# Patient Record
Sex: Female | Born: 1960
Health system: Southern US, Community
[De-identification: ages and names within clinical notes are randomized; demographics above are authoritative.]

## PROBLEM LIST (undated history)

## (undated) DIAGNOSIS — F419 Anxiety disorder, unspecified: Secondary | ICD-10-CM

## (undated) DIAGNOSIS — C50919 Malignant neoplasm of unspecified site of unspecified female breast: Secondary | ICD-10-CM

## (undated) DIAGNOSIS — F319 Bipolar disorder, unspecified: Secondary | ICD-10-CM

## (undated) DIAGNOSIS — Z9889 Other specified postprocedural states: Secondary | ICD-10-CM

## (undated) DIAGNOSIS — D649 Anemia, unspecified: Secondary | ICD-10-CM

## (undated) DIAGNOSIS — J40 Bronchitis, not specified as acute or chronic: Secondary | ICD-10-CM

## (undated) DIAGNOSIS — C801 Malignant (primary) neoplasm, unspecified: Secondary | ICD-10-CM

## (undated) DIAGNOSIS — Z95828 Presence of other vascular implants and grafts: Secondary | ICD-10-CM

## (undated) DIAGNOSIS — F329 Major depressive disorder, single episode, unspecified: Secondary | ICD-10-CM

## (undated) DIAGNOSIS — M1990 Unspecified osteoarthritis, unspecified site: Secondary | ICD-10-CM

## (undated) DIAGNOSIS — R112 Nausea with vomiting, unspecified: Secondary | ICD-10-CM

## (undated) DIAGNOSIS — I890 Lymphedema, not elsewhere classified: Secondary | ICD-10-CM

## (undated) DIAGNOSIS — F4310 Post-traumatic stress disorder, unspecified: Secondary | ICD-10-CM

## (undated) DIAGNOSIS — F1010 Alcohol abuse, uncomplicated: Secondary | ICD-10-CM

## (undated) DIAGNOSIS — M797 Fibromyalgia: Secondary | ICD-10-CM

## (undated) DIAGNOSIS — I10 Essential (primary) hypertension: Secondary | ICD-10-CM

## (undated) DIAGNOSIS — T4145XA Adverse effect of unspecified anesthetic, initial encounter: Secondary | ICD-10-CM

## (undated) DIAGNOSIS — R519 Headache, unspecified: Secondary | ICD-10-CM

## (undated) DIAGNOSIS — R51 Headache: Secondary | ICD-10-CM

## (undated) DIAGNOSIS — F1120 Opioid dependence, uncomplicated: Secondary | ICD-10-CM

## (undated) DIAGNOSIS — G8929 Other chronic pain: Secondary | ICD-10-CM

## (undated) DIAGNOSIS — I509 Heart failure, unspecified: Secondary | ICD-10-CM

## (undated) DIAGNOSIS — Z8489 Family history of other specified conditions: Secondary | ICD-10-CM

## (undated) DIAGNOSIS — K219 Gastro-esophageal reflux disease without esophagitis: Secondary | ICD-10-CM

## (undated) DIAGNOSIS — T8859XA Other complications of anesthesia, initial encounter: Secondary | ICD-10-CM

## (undated) DIAGNOSIS — F32A Depression, unspecified: Secondary | ICD-10-CM

## (undated) DIAGNOSIS — E785 Hyperlipidemia, unspecified: Secondary | ICD-10-CM

## (undated) HISTORY — PX: FIBULA FRACTURE SURGERY: SHX947

## (undated) HISTORY — PX: BREAST RECONSTRUCTION: SHX9

## (undated) HISTORY — PX: OTHER SURGICAL HISTORY: SHX169

## (undated) HISTORY — PX: MASTECTOMY: SHX3

## (undated) HISTORY — PX: COLONOSCOPY W/ POLYPECTOMY: SHX1380

---

## 2010-09-05 ENCOUNTER — Emergency Department (HOSPITAL_COMMUNITY): Admission: EM | Admit: 2010-09-05 | Discharge: 2010-09-05 | Payer: Self-pay | Admitting: Emergency Medicine

## 2010-10-22 ENCOUNTER — Emergency Department (HOSPITAL_COMMUNITY): Admission: EM | Admit: 2010-10-22 | Discharge: 2010-10-22 | Payer: Self-pay | Admitting: Emergency Medicine

## 2011-02-10 LAB — CBC
HCT: 40.2 % (ref 36.0–46.0)
Hemoglobin: 14 g/dL (ref 12.0–15.0)
MCH: 32.6 pg (ref 26.0–34.0)
MCHC: 34.8 g/dL (ref 30.0–36.0)
MCV: 93.8 fL (ref 78.0–100.0)
Platelets: 172 10*3/uL (ref 150–400)
RBC: 4.28 MIL/uL (ref 3.87–5.11)
RDW: 13.1 % (ref 11.5–15.5)
WBC: 6.6 10*3/uL (ref 4.0–10.5)

## 2011-02-10 LAB — DIFFERENTIAL
Basophils Absolute: 0 10*3/uL (ref 0.0–0.1)
Basophils Relative: 1 % (ref 0–1)
Eosinophils Absolute: 0.1 10*3/uL (ref 0.0–0.7)
Eosinophils Relative: 1 % (ref 0–5)
Lymphocytes Relative: 29 % (ref 12–46)
Lymphs Abs: 1.9 10*3/uL (ref 0.7–4.0)
Monocytes Absolute: 0.4 10*3/uL (ref 0.1–1.0)
Monocytes Relative: 7 % (ref 3–12)
Neutro Abs: 4.1 10*3/uL (ref 1.7–7.7)
Neutrophils Relative %: 63 % (ref 43–77)

## 2011-02-10 LAB — POCT I-STAT, CHEM 8
BUN: 10 mg/dL (ref 6–23)
Calcium, Ion: 1.08 mmol/L — ABNORMAL LOW (ref 1.12–1.32)
Chloride: 104 mEq/L (ref 96–112)
Creatinine, Ser: 1.1 mg/dL (ref 0.4–1.2)
Glucose, Bld: 83 mg/dL (ref 70–99)
HCT: 44 % (ref 36.0–46.0)
Hemoglobin: 15 g/dL (ref 12.0–15.0)
Potassium: 3.7 mEq/L (ref 3.5–5.1)
Sodium: 139 mEq/L (ref 135–145)
TCO2: 24 mmol/L (ref 0–100)

## 2011-05-17 ENCOUNTER — Ambulatory Visit (HOSPITAL_COMMUNITY)
Admission: RE | Admit: 2011-05-17 | Discharge: 2011-05-17 | Disposition: A | Discharge status: Home / Self Care | Payer: Medicaid Other | Source: Ambulatory Visit | Attending: Psychiatry | Admitting: Psychiatry

## 2011-05-17 ENCOUNTER — Emergency Department (HOSPITAL_COMMUNITY)
Admission: EM | Admit: 2011-05-17 | Discharge: 2011-05-18 | Disposition: A | Discharge status: Other Acute Inpatient Hospital | Payer: Medicaid Other | Attending: Emergency Medicine | Admitting: Emergency Medicine

## 2011-05-17 DIAGNOSIS — F3289 Other specified depressive episodes: Secondary | ICD-10-CM | POA: Insufficient documentation

## 2011-05-17 DIAGNOSIS — F329 Major depressive disorder, single episode, unspecified: Secondary | ICD-10-CM | POA: Insufficient documentation

## 2011-05-17 DIAGNOSIS — Z79899 Other long term (current) drug therapy: Secondary | ICD-10-CM | POA: Insufficient documentation

## 2011-05-17 DIAGNOSIS — F4310 Post-traumatic stress disorder, unspecified: Secondary | ICD-10-CM | POA: Insufficient documentation

## 2011-05-17 LAB — COMPREHENSIVE METABOLIC PANEL
ALT: 16 U/L (ref 0–35)
AST: 25 U/L (ref 0–37)
Albumin: 4.8 g/dL (ref 3.5–5.2)
Alkaline Phosphatase: 52 U/L (ref 39–117)
BUN: 18 mg/dL (ref 6–23)
CO2: 29 mEq/L (ref 19–32)
Calcium: 9.3 mg/dL (ref 8.4–10.5)
Chloride: 101 mEq/L (ref 96–112)
Creatinine, Ser: 0.64 mg/dL (ref 0.50–1.10)
GFR calc Af Amer: >60 mL/min (ref >60)
GFR calc non Af Amer: >60 mL/min (ref >60)
Glucose, Bld: 92 mg/dL (ref 70–99)
Potassium: 3.3 mEq/L — ABNORMAL LOW (ref 3.5–5.1)
Sodium: 139 mEq/L (ref 135–145)
Total Bilirubin: 0.2 mg/dL — ABNORMAL LOW (ref 0.3–1.2)
Total Protein: 7.3 g/dL (ref 6.0–8.3)

## 2011-05-17 LAB — CBC
HCT: 38.7 % (ref 36.0–46.0)
Hemoglobin: 13.1 g/dL (ref 12.0–15.0)
MCH: 30.8 pg (ref 26.0–34.0)
MCHC: 33.9 g/dL (ref 30.0–36.0)
MCV: 91.1 fL (ref 78.0–100.0)
Platelets: 205 10*3/uL (ref 150–400)
RBC: 4.25 MIL/uL (ref 3.87–5.11)
RDW: 14.2 % (ref 11.5–15.5)
WBC: 9.6 10*3/uL (ref 4.0–10.5)

## 2011-05-17 LAB — URINALYSIS, ROUTINE W REFLEX MICROSCOPIC
Bilirubin Urine: NEGATIVE
Glucose, UA: NEGATIVE mg/dL
Ketones, ur: NEGATIVE mg/dL
Leukocytes, UA: NEGATIVE
Nitrite: NEGATIVE
Protein, ur: NEGATIVE mg/dL
Specific Gravity, Urine: 1.04 — ABNORMAL HIGH (ref 1.005–1.030)
Urobilinogen, UA: 0.2 mg/dL (ref 0.0–1.0)
pH: 5 (ref 5.0–8.0)

## 2011-05-17 LAB — DIFFERENTIAL
Basophils Absolute: 0 10*3/uL (ref 0.0–0.1)
Basophils Relative: 0 % (ref 0–1)
Eosinophils Absolute: 0 10*3/uL (ref 0.0–0.7)
Eosinophils Relative: 0 % (ref 0–5)
Lymphocytes Relative: 18 % (ref 12–46)
Lymphs Abs: 1.7 10*3/uL (ref 0.7–4.0)
Monocytes Absolute: 0.6 10*3/uL (ref 0.1–1.0)
Monocytes Relative: 7 % (ref 3–12)
Neutro Abs: 7.2 10*3/uL (ref 1.7–7.7)
Neutrophils Relative %: 75 % (ref 43–77)

## 2011-05-17 LAB — RAPID URINE DRUG SCREEN, HOSP PERFORMED
Amphetamines: NOT DETECTED
Barbiturates: NOT DETECTED
Benzodiazepines: POSITIVE — AB
Cocaine: NOT DETECTED
Opiates: POSITIVE — AB
Tetrahydrocannabinol: NOT DETECTED

## 2011-05-17 LAB — ETHANOL: Alcohol, Ethyl (B): <11 mg/dL (ref 0–11)

## 2011-05-17 LAB — URINE MICROSCOPIC-ADD ON

## 2011-05-17 LAB — PREGNANCY, URINE: Preg Test, Ur: NEGATIVE

## 2011-05-18 ENCOUNTER — Inpatient Hospital Stay (HOSPITAL_COMMUNITY)
Admission: AD | Admit: 2011-05-18 | Discharge: 2011-05-24 | DRG: 897 | Disposition: A | Discharge status: Home / Self Care | Payer: PRIVATE HEALTH INSURANCE | Source: Ambulatory Visit | Attending: Psychiatry | Admitting: Psychiatry

## 2011-05-18 DIAGNOSIS — R45851 Suicidal ideations: Secondary | ICD-10-CM

## 2011-05-18 DIAGNOSIS — F332 Major depressive disorder, recurrent severe without psychotic features: Secondary | ICD-10-CM

## 2011-05-18 DIAGNOSIS — F39 Unspecified mood [affective] disorder: Secondary | ICD-10-CM

## 2011-05-18 DIAGNOSIS — Z5900 Homelessness unspecified: Secondary | ICD-10-CM

## 2011-05-18 DIAGNOSIS — IMO0002 Reserved for concepts with insufficient information to code with codable children: Secondary | ICD-10-CM

## 2011-05-18 DIAGNOSIS — Z560 Unemployment, unspecified: Secondary | ICD-10-CM

## 2011-05-18 DIAGNOSIS — F4310 Post-traumatic stress disorder, unspecified: Secondary | ICD-10-CM

## 2011-05-18 DIAGNOSIS — IMO0001 Reserved for inherently not codable concepts without codable children: Secondary | ICD-10-CM

## 2011-05-18 DIAGNOSIS — Z818 Family history of other mental and behavioral disorders: Secondary | ICD-10-CM

## 2011-05-18 DIAGNOSIS — F1010 Alcohol abuse, uncomplicated: Secondary | ICD-10-CM

## 2011-05-18 DIAGNOSIS — F1110 Opioid abuse, uncomplicated: Principal | ICD-10-CM

## 2011-05-19 DIAGNOSIS — F332 Major depressive disorder, recurrent severe without psychotic features: Secondary | ICD-10-CM

## 2011-05-28 ENCOUNTER — Emergency Department (HOSPITAL_BASED_OUTPATIENT_CLINIC_OR_DEPARTMENT_OTHER)
Admission: EM | Admit: 2011-05-28 | Discharge: 2011-05-28 | Disposition: A | Discharge status: Home / Self Care | Payer: PRIVATE HEALTH INSURANCE | Attending: Emergency Medicine | Admitting: Emergency Medicine

## 2011-05-28 DIAGNOSIS — F341 Dysthymic disorder: Secondary | ICD-10-CM | POA: Insufficient documentation

## 2011-05-28 DIAGNOSIS — Z79899 Other long term (current) drug therapy: Secondary | ICD-10-CM | POA: Insufficient documentation

## 2011-05-28 DIAGNOSIS — M5430 Sciatica, unspecified side: Secondary | ICD-10-CM | POA: Insufficient documentation
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
## 2011-05-30 ENCOUNTER — Emergency Department (HOSPITAL_BASED_OUTPATIENT_CLINIC_OR_DEPARTMENT_OTHER)
Admission: EM | Admit: 2011-05-30 | Discharge: 2011-05-30 | Disposition: A | Discharge status: Home / Self Care | Payer: PRIVATE HEALTH INSURANCE | Attending: Emergency Medicine | Admitting: Emergency Medicine

## 2011-05-30 DIAGNOSIS — M549 Dorsalgia, unspecified: Secondary | ICD-10-CM | POA: Insufficient documentation

## 2011-05-30 DIAGNOSIS — M5430 Sciatica, unspecified side: Secondary | ICD-10-CM | POA: Insufficient documentation

## 2011-05-30 DIAGNOSIS — Z79899 Other long term (current) drug therapy: Secondary | ICD-10-CM | POA: Insufficient documentation

## 2011-05-30 DIAGNOSIS — F17200 Nicotine dependence, unspecified, uncomplicated: Secondary | ICD-10-CM | POA: Insufficient documentation
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
## 2011-06-26 ENCOUNTER — Encounter: Payer: Self-pay | Admitting: *Deleted

## 2011-06-26 ENCOUNTER — Emergency Department (HOSPITAL_BASED_OUTPATIENT_CLINIC_OR_DEPARTMENT_OTHER)
Admission: EM | Admit: 2011-06-26 | Discharge: 2011-06-26 | Disposition: A | Discharge status: Home / Self Care | Payer: Self-pay | Attending: Emergency Medicine | Admitting: Emergency Medicine

## 2011-06-26 DIAGNOSIS — F17200 Nicotine dependence, unspecified, uncomplicated: Secondary | ICD-10-CM | POA: Insufficient documentation

## 2011-06-26 DIAGNOSIS — IMO0001 Reserved for inherently not codable concepts without codable children: Secondary | ICD-10-CM | POA: Insufficient documentation

## 2011-06-26 DIAGNOSIS — G8929 Other chronic pain: Secondary | ICD-10-CM | POA: Insufficient documentation

## 2011-06-26 DIAGNOSIS — F341 Dysthymic disorder: Secondary | ICD-10-CM | POA: Insufficient documentation

## 2011-06-26 DIAGNOSIS — M549 Dorsalgia, unspecified: Secondary | ICD-10-CM | POA: Insufficient documentation

## 2011-06-26 HISTORY — DX: Anxiety disorder, unspecified: F41.9

## 2011-06-26 HISTORY — DX: Fibromyalgia: M79.7

## 2011-06-26 HISTORY — DX: Depression, unspecified: F32.A

## 2011-06-26 HISTORY — DX: Post-traumatic stress disorder, unspecified: F43.10

## 2011-06-26 HISTORY — DX: Major depressive disorder, single episode, unspecified: F32.9

## 2011-06-26 MED ORDER — HYDROCODONE-ACETAMINOPHEN 5-325 MG PO TABS: 1.0000 | ORAL_TABLET | Freq: Four times a day (QID) | ORAL | Status: AC | PRN

## 2011-06-26 MED ORDER — DEXAMETHASONE SODIUM PHOSPHATE 10 MG/ML IJ SOLN
10.0000 mg | Freq: Once | INTRAMUSCULAR | Status: AC
  Administered 2011-06-26: 10 mg via INTRAVENOUS
  Filled 2011-06-26: qty 1

## 2011-06-26 MED ORDER — HYDROCODONE-ACETAMINOPHEN 5-325 MG PO TABS
1.0000 | ORAL_TABLET | Freq: Once | ORAL | Status: AC
  Administered 2011-06-26: 1 via ORAL
  Filled 2011-06-26: qty 1

## 2011-06-26 MED ORDER — CYCLOBENZAPRINE HCL 10 MG PO TABS: 10.0000 mg | ORAL_TABLET | Freq: Two times a day (BID) | ORAL | Status: AC | PRN

## 2011-06-26 MED ORDER — DICLOFENAC SODIUM 75 MG PO TBEC: 75.0000 mg | DELAYED_RELEASE_TABLET | Freq: Two times a day (BID) | ORAL | Status: DC

## 2011-06-26 MED ORDER — KETOROLAC TROMETHAMINE 60 MG/2ML IM SOLN
60.0000 mg | Freq: Once | INTRAMUSCULAR | Status: AC
  Administered 2011-06-26: 60 mg via INTRAMUSCULAR
  Filled 2011-06-26: qty 2

## 2011-06-26 NOTE — Discharge Instructions (Signed)
Back Exercises Back exercises help treat and prevent back injuries. The goal of back exercises is to increase the strength of your abdominal and back muscles and the flexibility of your back. These exercises should be started when you no longer have back pain. Back exercises include: 1. Pelvic Tilt - Lie on your back with your knees bent. Tilt your pelvis until the lower part of your back is against the floor. Hold this position 5-10 sec and repeat 5-10 times.  2. Knee to Chest - Pull first one knee up against your chest and hold for 20-30 seconds, repeat this with the other knee, and then both knees. This may be done with the other leg straight or bent, whichever feels better.  3. Sit-Ups or Curl-Ups - Bend your knees 90 degrees. Start with tilting your pelvis, and do a partial, slow sit-up, lifting your trunk only 30-45 degrees off the floor. Take at least 2-3 sec for each sit-up. Do not do sit-ups with your knees out straight. If partial sit-ups are difficult, simply do the above but with only tightening your abdominal muscles and holding it as directed.  4. Hip-Lift - Lie on your back with your knees flexed 90 degrees. Push down with your feet and shoulders as you raise your hips a couple inches off the floor; hold for 10 sec, repeat 5-10 times.  5. Back arches - Lie on your stomach, propping yourself up on bent elbows. Slowly press on your hands, causing an arch in your low back. Repeat 3-5 times. Any initial stiffness and discomfort should lessen with repetition over time.  6. Shoulder-Lifts - Lie face down with arms beside your body. Keep hips and torso pressed to floor as you slowly lift your head and shoulders off the floor.  Do not overdo your exercises, especially in the beginning. Exercises may cause you some mild back discomfort which lasts for a few minutes; however, if the pain is more severe, or lasts for more than 15 minutes, do not continue exercises until you see your caregiver.  Improvement with exercise therapy for back problems is slow.  See your caregivers for assistance with developing a proper back exercise program. Document Released: 12/24/2004 Document Re-Released: 02/12/2009 Exit[employer] Patient Information 2011 [employer]. 

## 2011-06-26 NOTE — ED Notes (Signed)
Pt c/o lower back pain x 2 months

## 2011-06-26 NOTE — ED Notes (Signed)
Call bell rung, pt needs addressed, pt reports being very upset for having to wait so long. Apologized for wait. Pt talking on phone and informed that MDs will be in to see her as soon as they can.

## 2011-06-26 NOTE — ED Notes (Signed)
Patient is resting comfortably. Warm blankets given

## 2011-06-29 NOTE — ED Provider Notes (Signed)
History     Chief Complaint  Patient presents with  . Back Pain   Patient is a 50 y.o. female presenting with back pain. The history is provided by the patient.  Back Pain  This is a chronic problem. Episode onset: Several months ago. The problem occurs constantly. The problem has been gradually worsening. The pain is associated with no known injury. The pain is present in the lumbar spine. The quality of the pain is described as stabbing. The pain radiates to the right thigh. The pain is severe. The symptoms are aggravated by certain positions and bending. The pain is the same all the time. Pertinent negatives include no chest pain, no fever, no numbness, no headaches, no abdominal pain, no abdominal swelling, no bowel incontinence, no perianal numbness, no bladder incontinence, no dysuria, no pelvic pain, no leg pain, no paresthesias, no tingling and no weakness. She has tried nothing for the symptoms.    Past Medical History  Diagnosis Date  . Fibromyalgia   . Depression   . Anxiety   . PTSD (post-traumatic stress disorder)     History reviewed. No pertinent past surgical history.  History reviewed. No pertinent family history.  History  Substance Use Topics  . Smoking status: Current Everyday Smoker -- 1.0 packs/day  . Smokeless tobacco: Not on file  . Alcohol Use: No    OB History    Grav Para Term Preterm Abortions TAB SAB Ect Mult Living                  Review of Systems  Constitutional: Negative for fever and chills.  HENT: Negative for neck pain and neck stiffness.   Cardiovascular: Negative for chest pain.  Gastrointestinal: Negative for abdominal pain and bowel incontinence.  Genitourinary: Negative for bladder incontinence, dysuria, urgency, frequency, hematuria, flank pain and pelvic pain.  Musculoskeletal: Positive for back pain.  Neurological: Negative for tingling, weakness, numbness, headaches and paresthesias.    Physical Exam  BP 117/64  Pulse 93   Temp(Src) 99.3 F (37.4 C) (Oral)  Resp 16  Wt 130 lb (58.968 kg)  Physical Exam  Constitutional: She is oriented to person, place, and time. She appears well-developed and well-nourished. No distress.  HENT:  Head: Normocephalic and atraumatic.  Eyes: Conjunctivae and lids are normal. Pupils are equal, round, and reactive to light.  Neck: Normal range of motion. Neck supple. No spinous process tenderness and no muscular tenderness present. Normal range of motion present.  Cardiovascular: Normal rate, regular rhythm and normal heart sounds.   Pulmonary/Chest: Effort normal and breath sounds normal. She has no wheezes. She has no rales. She exhibits no tenderness.  Abdominal: Soft. Bowel sounds are normal. She exhibits no distension and no mass. There is no tenderness. There is no rebound and no guarding.  Musculoskeletal: Normal range of motion.       Lumbar back: She exhibits pain. She exhibits normal range of motion, no tenderness, no swelling, no deformity and no spasm.       Back:       Neg SLR. Positive paraspinal tenderness.   Neurological: She is alert and oriented to person, place, and time. Coordination normal.  Skin: Skin is warm and dry. No rash noted. No erythema. No pallor.    ED Course  Procedures  MDM       Thomasene Lot, [place] 06/29/11 1105

## 2011-07-03 NOTE — ED Provider Notes (Signed)
Evaluation and management procedures were performed by the PA/NP under my supervision/collaboration.   Dione Booze, MD 07/03/11 1320

## 2011-07-28 ENCOUNTER — Emergency Department (HOSPITAL_COMMUNITY)
Admission: EM | Admit: 2011-07-28 | Discharge: 2011-07-29 | Disposition: A | Discharge status: Other Acute Inpatient Hospital | Payer: Medicaid Other | Source: Home / Self Care | Attending: Emergency Medicine | Admitting: Emergency Medicine

## 2011-07-28 DIAGNOSIS — F1110 Opioid abuse, uncomplicated: Secondary | ICD-10-CM | POA: Insufficient documentation

## 2011-07-28 DIAGNOSIS — F3289 Other specified depressive episodes: Secondary | ICD-10-CM | POA: Insufficient documentation

## 2011-07-28 DIAGNOSIS — Z79899 Other long term (current) drug therapy: Secondary | ICD-10-CM | POA: Insufficient documentation

## 2011-07-28 DIAGNOSIS — F329 Major depressive disorder, single episode, unspecified: Secondary | ICD-10-CM | POA: Insufficient documentation

## 2011-07-28 LAB — DIFFERENTIAL
Basophils Absolute: 0 10*3/uL (ref 0.0–0.1)
Basophils Relative: 1 % (ref 0–1)
Eosinophils Absolute: 0.1 10*3/uL (ref 0.0–0.7)
Eosinophils Relative: 2 % (ref 0–5)
Lymphocytes Relative: 38 % (ref 12–46)
Lymphs Abs: 2.4 10*3/uL (ref 0.7–4.0)
Monocytes Absolute: 0.5 10*3/uL (ref 0.1–1.0)
Monocytes Relative: 8 % (ref 3–12)
Neutro Abs: 3.3 10*3/uL (ref 1.7–7.7)
Neutrophils Relative %: 52 % (ref 43–77)

## 2011-07-28 LAB — CBC
HCT: 36.4 % (ref 36.0–46.0)
Hemoglobin: 12.7 g/dL (ref 12.0–15.0)
MCH: 31.9 pg (ref 26.0–34.0)
MCHC: 34.9 g/dL (ref 30.0–36.0)
MCV: 91.5 fL (ref 78.0–100.0)
Platelets: 187 10*3/uL (ref 150–400)
RBC: 3.98 MIL/uL (ref 3.87–5.11)
RDW: 12.4 % (ref 11.5–15.5)
WBC: 6.3 10*3/uL (ref 4.0–10.5)

## 2011-07-28 LAB — RAPID URINE DRUG SCREEN, HOSP PERFORMED
Amphetamines: NOT DETECTED
Barbiturates: NOT DETECTED
Benzodiazepines: NOT DETECTED
Cocaine: NOT DETECTED
Opiates: POSITIVE — AB
Tetrahydrocannabinol: NOT DETECTED

## 2011-07-28 LAB — COMPREHENSIVE METABOLIC PANEL
ALT: 10 U/L (ref 0–35)
AST: 14 U/L (ref 0–37)
Albumin: 3.8 g/dL (ref 3.5–5.2)
Alkaline Phosphatase: 48 U/L (ref 39–117)
BUN: 13 mg/dL (ref 6–23)
CO2: 26 mEq/L (ref 19–32)
Calcium: 9.1 mg/dL (ref 8.4–10.5)
Chloride: 101 mEq/L (ref 96–112)
Creatinine, Ser: 0.76 mg/dL (ref 0.50–1.10)
GFR calc Af Amer: >60 mL/min (ref >60)
GFR calc non Af Amer: >60 mL/min (ref >60)
Glucose, Bld: 90 mg/dL (ref 70–99)
Potassium: 3.6 mEq/L (ref 3.5–5.1)
Sodium: 137 mEq/L (ref 135–145)
Total Bilirubin: 0.1 mg/dL — ABNORMAL LOW (ref 0.3–1.2)
Total Protein: 6.5 g/dL (ref 6.0–8.3)

## 2011-07-28 LAB — ACETAMINOPHEN LEVEL: Acetaminophen (Tylenol), Serum: <15 ug/mL (ref 10–30)

## 2011-07-28 LAB — POCT PREGNANCY, URINE: Preg Test, Ur: NEGATIVE

## 2011-07-28 LAB — ETHANOL: Alcohol, Ethyl (B): <11 mg/dL (ref 0–11)

## 2011-07-29 ENCOUNTER — Inpatient Hospital Stay (HOSPITAL_COMMUNITY)
Admission: EM | Admit: 2011-07-29 | Discharge: 2011-08-01 | DRG: 897 | Disposition: A | Discharge status: Home / Self Care | Payer: Medicaid Other | Source: Ambulatory Visit | Attending: Psychiatry | Admitting: Psychiatry

## 2011-07-29 DIAGNOSIS — IMO0002 Reserved for concepts with insufficient information to code with codable children: Secondary | ICD-10-CM

## 2011-07-29 DIAGNOSIS — F329 Major depressive disorder, single episode, unspecified: Secondary | ICD-10-CM

## 2011-07-29 DIAGNOSIS — IMO0001 Reserved for inherently not codable concepts without codable children: Secondary | ICD-10-CM

## 2011-07-29 DIAGNOSIS — F17200 Nicotine dependence, unspecified, uncomplicated: Secondary | ICD-10-CM

## 2011-07-29 DIAGNOSIS — G8929 Other chronic pain: Secondary | ICD-10-CM

## 2011-07-29 DIAGNOSIS — F321 Major depressive disorder, single episode, moderate: Secondary | ICD-10-CM

## 2011-07-29 DIAGNOSIS — F411 Generalized anxiety disorder: Secondary | ICD-10-CM

## 2011-07-29 DIAGNOSIS — F4310 Post-traumatic stress disorder, unspecified: Secondary | ICD-10-CM

## 2011-07-29 DIAGNOSIS — Z6379 Other stressful life events affecting family and household: Secondary | ICD-10-CM

## 2011-07-29 DIAGNOSIS — F1320 Sedative, hypnotic or anxiolytic dependence, uncomplicated: Secondary | ICD-10-CM

## 2011-07-29 DIAGNOSIS — M549 Dorsalgia, unspecified: Secondary | ICD-10-CM

## 2011-07-29 DIAGNOSIS — F1120 Opioid dependence, uncomplicated: Secondary | ICD-10-CM

## 2011-07-29 DIAGNOSIS — F1920 Other psychoactive substance dependence, uncomplicated: Principal | ICD-10-CM

## 2011-07-29 DIAGNOSIS — Z818 Family history of other mental and behavioral disorders: Secondary | ICD-10-CM

## 2011-07-29 DIAGNOSIS — Z79899 Other long term (current) drug therapy: Secondary | ICD-10-CM
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
## 2011-08-01 DIAGNOSIS — F1320 Sedative, hypnotic or anxiolytic dependence, uncomplicated: Secondary | ICD-10-CM

## 2011-08-01 DIAGNOSIS — F17200 Nicotine dependence, unspecified, uncomplicated: Secondary | ICD-10-CM

## 2011-08-01 DIAGNOSIS — F1120 Opioid dependence, uncomplicated: Secondary | ICD-10-CM
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
## 2011-09-05 ENCOUNTER — Emergency Department (HOSPITAL_COMMUNITY)
Admission: EM | Admit: 2011-09-05 | Discharge: 2011-09-05 | Discharge status: Against Medical Advice | Payer: Medicaid Other | Attending: Emergency Medicine | Admitting: Emergency Medicine

## 2011-09-05 DIAGNOSIS — M545 Low back pain, unspecified: Secondary | ICD-10-CM | POA: Insufficient documentation

## 2011-09-05 DIAGNOSIS — M25569 Pain in unspecified knee: Secondary | ICD-10-CM | POA: Insufficient documentation

## 2011-09-05 DIAGNOSIS — M5430 Sciatica, unspecified side: Secondary | ICD-10-CM | POA: Insufficient documentation

## 2011-10-17 ENCOUNTER — Emergency Department (HOSPITAL_COMMUNITY)
Admission: EM | Admit: 2011-10-17 | Discharge: 2011-10-17 | Disposition: A | Discharge status: Home / Self Care | Payer: Self-pay | Attending: Emergency Medicine | Admitting: Emergency Medicine

## 2011-10-17 ENCOUNTER — Encounter (HOSPITAL_COMMUNITY): Payer: Self-pay | Admitting: *Deleted

## 2011-10-17 DIAGNOSIS — Z0000 Encounter for general adult medical examination without abnormal findings: Secondary | ICD-10-CM | POA: Insufficient documentation

## 2011-10-17 DIAGNOSIS — F411 Generalized anxiety disorder: Secondary | ICD-10-CM | POA: Insufficient documentation

## 2011-10-17 DIAGNOSIS — F1011 Alcohol abuse, in remission: Secondary | ICD-10-CM | POA: Insufficient documentation

## 2011-10-17 DIAGNOSIS — F17200 Nicotine dependence, unspecified, uncomplicated: Secondary | ICD-10-CM | POA: Insufficient documentation

## 2011-10-17 HISTORY — DX: Alcohol abuse, uncomplicated: F10.10

## 2011-10-17 NOTE — ED Notes (Signed)
Pt states she is living in a recovery house for drug/alcohol and this a.m. She tested positive for PCP on a urine drug screen.  Pt states she tested negative for all of the drugs she is supposed to be taking (rx drugs).  Pt was sent here to have a urine drug screen done so she can return to recovery house.

## 2011-10-17 NOTE — Discharge Instructions (Signed)
As we discussed, your Lamictal can cause a false positive for PCP on a urine drug screen. Your behavior is not consistent with that of someone who is taking PCP. Please return to the recovery house as discussed and continue taking your prescribed medications as instructed.  RESOURCE GUIDE  Dental Problems  Patients with Medicaid: [facility] [phone] [address]                                           [phone] [address] Phone:  [phone]                                                  Phone:  [phone]  If unable to pay or uninsured, contact:  [facility] or [facility] to become qualified for the adult dental clinic.  Chronic Pain Problems Contact [facility]  [phone] Patients need to be referred by their primary care doctor.  Insufficient Money for Medicine Contact [employer]:  call "211" or [facility] [phone]) 521-3291.  No Primary Care Doctor Call [facility]  2237933781 Other agencies that provide inexpensive medical care    [facility]  [phone]    [facility]  [phone]    [facility]  [phone]    [facility]  [phone]    [facility]  959-340-8622    [facility]  [phone]  Psychological Services [facility] Behavioral Health  [phone] [facility] Services  [phone] [facility] Mental Health   [phone] (emergency services ([phone])  Substance Abuse Resources [facility]  [phone] [facility] 925-754-7813 [facility] [place] [phone] Floydene Flock [phone] Residential & Outpatient Substance Abuse Program  438-770-7895  Abuse/Neglect [facility] Child Abuse Hotline (680)455-8962 [facility] Child Abuse Hotline 418-647-6897 (After Hours)  Emergency Shelter [facility] Minist[facility] (401)748-9508  M[facility] [place] (319)547-5882 [facility] (539) [phone]  MRSA Hotline #:   720-509-0121    [facility] Resources  [facility]     [employer]                          [facility] [address] [place]                       [address]      [address]  [place]                                                Cristobal Goldmann Phone:  [phone]  Phone:  [phone]                 Phone:  [phone]  [facility] Phone:  [phone]  [facility] Child Abuse Hotline [phone] (After Hours)

## 2011-10-17 NOTE — ED Provider Notes (Signed)
History/physical exam/procedure(s) were performed by non-physician practitioner and as supervising physician I was immediately available for consultation/collaboration. I have reviewed all notes and am in agreement with care and plan.   Ekansh Sherk S Robertha Staples, MD 10/17/11 1610 

## 2011-10-17 NOTE — ED Provider Notes (Signed)
History     CSN: [id number] Arrival date & time: 10/17/2011  1:34 PM   First MD Initiated Contact with Patient 10/17/11 1407      Chief Complaint  Patient presents with  . Drug / Alcohol Assessment    pt requesting drug screening    (Consider location/radiation/quality/duration/timing/severity/associated sxs/prior treatment) The history is provided by the patient.   [name] is a 50 year old female who presents requesting a drug screen. She currently lives in a recovery house and has a history of addiction. She was given a random urine drug screen today and was told that she tested positive for PCP. She reports that she is going to be kicked out of the recovery house and less she can try documentation to them that she is not using PCP. She denies any pain. She denies any suicidal ideation, homicidal ideation, or lacerations. She denies any drug use beyond her prescribed medications.  Past Medical History  Diagnosis Date  . Fibromyalgia   . Depression   . Anxiety   . PTSD (post-traumatic stress disorder)   . Alcohol abuse     Past Surgical History  Procedure Date  . Fibula fracture surgery     No family history on file.  History  Substance Use Topics  . Smoking status: Current Everyday Smoker -- 1.0 packs/day  . Smokeless tobacco: Not on file  . Alcohol Use: No     Review of Systems  Respiratory: Negative for shortness of breath.   Cardiovascular: Negative for chest pain.  Psychiatric/Behavioral: The patient is nervous/anxious.   All other systems reviewed and are negative.    Allergies  Demerol and Erythromycin  Home Medications   Current Outpatient Rx  Name Route Sig Dispense Refill  . CLONAZEPAM 1 MG PO TABS Oral Take 1 mg by mouth 2 (two) times daily.      [name] GABAPENTIN 600 MG PO TABS Oral Take 600 mg by mouth 3 (three) times daily.     . IBUPROFEN 200 MG PO TABS Oral Take 200 mg by mouth every 6 (six) hours as needed. Pain      . LAMOTRIGINE 150 MG PO  TABS Oral Take 150 mg by mouth daily.      . TOPIRAMATE 25 MG PO TABS Oral Take 25 mg by mouth 2 (two) times daily.      . TRAZODONE HCL 100 MG PO TABS Oral Take 200 mg by mouth at bedtime.      . VENLAFAXINE HCL 75 MG PO TABS Oral Take 150 mg by mouth daily.        BP 115/75  Pulse 98  Temp(Src) 98.4 F (36.9 C) (Oral)  Resp 18  SpO2 100%  Physical Exam  Constitutional: She is oriented to person, place, and time. She appears well-developed and well-nourished. No distress.  HENT:  Head: Normocephalic and atraumatic.  Eyes: Pupils are equal, round, and reactive to light.  Neck: Normal range of motion. Neck supple.  Cardiovascular: Normal rate and regular rhythm.   Pulmonary/Chest: Effort normal and breath sounds normal.  Abdominal: Soft. She exhibits no distension. There is no tenderness.  Musculoskeletal: Normal range of motion. She exhibits no edema and no tenderness.  Neurological: She is alert and oriented to person, place, and time. No cranial nerve deficit.  Skin: Skin is warm and dry. No rash noted.  Psychiatric:       Anxious appearing    ED Course  Procedures (including critical care time)    1. Normal physical  examination       MDM  Monitoring the patient's medication list it appears that she takes lamotrigine. I have found a journal article and the international journal emergency medicine reports that it can cause false positives for PCP on rapid urine drug screens. The drug screen administered by the facility falls into the category mentioned in the sternal article. I have spoken to a case and are at the recovery house and informed him of this possibility and the fact that the patient's behavior is not consistent with that of someone who is taking PCP. He agrees and she will be accepted back at the house with documentation for me on her discharge paperwork reporting this.        Elwyn Reach [place], [place] 10/17/11 [phone]

## 2011-10-17 NOTE — ED Notes (Signed)
Pt requesting test for PCP, states she lives in a recovery house and tested positive, states she has never used nor knows what the drug is.

## 2011-12-06 ENCOUNTER — Other Ambulatory Visit: Payer: Self-pay

## 2011-12-06 ENCOUNTER — Encounter (HOSPITAL_COMMUNITY): Payer: Self-pay

## 2011-12-06 ENCOUNTER — Emergency Department (HOSPITAL_COMMUNITY)
Admission: EM | Admit: 2011-12-06 | Discharge: 2011-12-06 | Disposition: A | Discharge status: Home / Self Care | Payer: Self-pay

## 2011-12-06 NOTE — ED Notes (Signed)
Pt also c/o neck pain and tightness for several weeks

## 2011-12-06 NOTE — ED Notes (Signed)
Pt is in via ems from work states syncope episode this am pt states dizziness weakness states increased stressed as well

## 2011-12-06 NOTE — ED Notes (Signed)
Per EMS, while working at [employer] today, pt became dizzy but did not pass out, N/V/D over the past few days as well as increased stress and not eating well. CBG=81.

## 2012-06-06 ENCOUNTER — Emergency Department (HOSPITAL_COMMUNITY)
Admission: EM | Admit: 2012-06-06 | Discharge: 2012-06-06 | Disposition: A | Discharge status: Home / Self Care | Payer: Self-pay | Attending: Emergency Medicine | Admitting: Emergency Medicine

## 2012-06-06 ENCOUNTER — Encounter (HOSPITAL_COMMUNITY): Payer: Self-pay | Admitting: Emergency Medicine

## 2012-06-06 DIAGNOSIS — M545 Low back pain, unspecified: Secondary | ICD-10-CM | POA: Insufficient documentation

## 2012-06-06 DIAGNOSIS — IMO0001 Reserved for inherently not codable concepts without codable children: Secondary | ICD-10-CM | POA: Insufficient documentation

## 2012-06-06 DIAGNOSIS — Z79899 Other long term (current) drug therapy: Secondary | ICD-10-CM | POA: Insufficient documentation

## 2012-06-06 DIAGNOSIS — F17200 Nicotine dependence, unspecified, uncomplicated: Secondary | ICD-10-CM | POA: Insufficient documentation

## 2012-06-06 DIAGNOSIS — M549 Dorsalgia, unspecified: Secondary | ICD-10-CM

## 2012-06-06 HISTORY — DX: Unspecified osteoarthritis, unspecified site: M19.90

## 2012-06-06 MED ORDER — CYCLOBENZAPRINE HCL 10 MG PO TABS: 10.0000 mg | ORAL_TABLET | Freq: Three times a day (TID) | ORAL | Status: AC | PRN

## 2012-06-06 MED ORDER — TRAMADOL HCL 50 MG PO TABS: 50.0000 mg | ORAL_TABLET | Freq: Four times a day (QID) | ORAL | Status: AC | PRN

## 2012-06-06 MED ORDER — DIAZEPAM 5 MG/ML IJ SOLN
5.0000 mg | Freq: Once | INTRAMUSCULAR | Status: AC
  Administered 2012-06-06: 5 mg via INTRAMUSCULAR
  Filled 2012-06-06: qty 2

## 2012-06-06 MED ORDER — TRAMADOL HCL 50 MG PO TABS
50.0000 mg | ORAL_TABLET | Freq: Once | ORAL | Status: AC
  Administered 2012-06-06: 50 mg via ORAL
  Filled 2012-06-06: qty 1

## 2012-06-06 NOTE — ED Provider Notes (Signed)
Medical screening examination/treatment/procedure(s) were performed by non-physician practitioner and as supervising physician I was immediately available for consultation/collaboration.  Doug Sou, MD 06/06/12 1600

## 2012-06-06 NOTE — ED Provider Notes (Signed)
History     CSN: [id number]  Arrival date & time 06/06/12  1026   First MD Initiated Contact with Patient 06/06/12 1055      Chief Complaint  Patient presents with  . Back Pain    (Consider location/radiation/quality/duration/timing/severity/associated sxs/prior treatment) HPI Comments: Patient reports that she has had intermittent pain across her lower back for the past 2 weeks.  She reports that she has had similar pain in the past.  The pain feels like a spasm.  She reports that in the past Ultram and Flexeril have helped her pain.  No acute injury or trauma.    Patient is a 51 y.o. female presenting with back pain. The history is provided by the patient.  Back Pain  This is a recurrent problem. The problem has been gradually worsening. The pain is associated with no known injury. The pain is present in the lumbar spine. The pain radiates to the left thigh. The symptoms are aggravated by bending and twisting. The pain is the same all the time. Pertinent negatives include no chest pain, no fever, no numbness, no abdominal pain, no bowel incontinence, no perianal numbness, no bladder incontinence, no dysuria, no paresthesias, no paresis, no tingling and no weakness. She has tried NSAIDs for the symptoms. The treatment provided mild relief.    Past Medical History  Diagnosis Date  . Fibromyalgia   . Depression   . Anxiety   . PTSD (post-traumatic stress disorder)   . Alcohol abuse   . Arthritis     Past Surgical History  Procedure Date  . Fibula fracture surgery     Family History  Problem Relation Age of Onset  . Diabetes Mother   . Hypertension Other     History  Substance Use Topics  . Smoking status: Current Everyday Smoker -- 1.0 packs/day    Types: Cigarettes  . Smokeless tobacco: Not on file  . Alcohol Use: No    OB History    Grav Para Term Preterm Abortions TAB SAB Ect Mult Living                  Review of Systems  Constitutional: Negative for fever  and chills.  HENT: Negative for neck pain and neck stiffness.   Cardiovascular: Negative for chest pain.  Gastrointestinal: Negative for nausea, vomiting, abdominal pain and bowel incontinence.  Genitourinary: Negative for bladder incontinence, dysuria and hematuria.       No bowel or bladder incontinence  No urinary retention  Musculoskeletal: Positive for back pain. Negative for joint swelling and gait problem.  Skin: Negative for color change and rash.  Neurological: Negative for tingling, weakness, numbness and paresthesias.    Allergies  Demerol and Erythromycin  Home Medications   Current Outpatient Rx  Name Route Sig Dispense Refill  . GABAPENTIN 600 MG PO TABS Oral Take 600 mg by mouth 3 (three) times daily.     . IBUPROFEN 200 MG PO TABS Oral Take 200 mg by mouth every 6 (six) hours as needed. Pain      . LAMOTRIGINE 100 MG PO TABS Oral Take 50 mg by mouth daily. Pt's on a taper dose, pt is currently on 50 mg once daily for 7 days. Pt will start lamictal 100 mg on 06-07-12. For mood    . TRAZODONE HCL 100 MG PO TABS Oral Take 200 mg by mouth at bedtime.      . VENLAFAXINE HCL 75 MG PO TABS Oral Take 150 mg by  mouth daily.       BP 116/71  Pulse 89  Temp 98.2 F (36.8 C) (Oral)  Resp 18  SpO2 100%  Physical Exam  Nursing note and vitals reviewed. Constitutional: She is oriented to person, place, and time. She appears well-developed and well-nourished. No distress.  HENT:  Head: Normocephalic and atraumatic.  Eyes: Conjunctivae and EOM are normal. Pupils are equal, round, and reactive to light. No scleral icterus.  Neck: Normal range of motion and full passive range of motion without pain. Neck supple. No spinous process tenderness and no muscular tenderness present. No rigidity. Normal range of motion present. No Brudzinski's sign noted.  Cardiovascular: Normal rate, regular rhythm and intact distal pulses.  Exam reveals no gallop and no friction rub.   No murmur  heard. Pulmonary/Chest: Effort normal and breath sounds normal. No respiratory distress. She has no wheezes. She has no rales. She exhibits no tenderness.  Musculoskeletal:       Cervical back: She exhibits normal range of motion, no tenderness, no bony tenderness and no pain.       Thoracic back: She exhibits no tenderness, no bony tenderness and no pain.       Lumbar back: She exhibits tenderness, bony tenderness and pain. She exhibits no spasm and normal pulse.       Bilateral lower extremities nontender without color change, baseline range of motion of extremities with intact distal pulses, capillary refill less than 2 seconds bilaterally.  Pt has increased pain w ROM of lumbar spine. Pain w ambulation, no sign of ataxia.  Neurological: She is alert and oriented to person, place, and time. She has normal strength and normal reflexes. No sensory deficit. Gait normal.       Sensation at baseline for light touch in all 4 distal extremities, motor symmetric & bilateral 5/5 (hips: abduction, adduction, flexion; knee: flexion & extension; foot: dorsiflexion, plantar flexion, toes: dorsi flexion) Patellar & ankle reflexes intact.   Skin: Skin is warm and dry. No rash noted. She is not diaphoretic. No erythema. No pallor.  Psychiatric: She has a normal mood and affect.    ED Course  Procedures (including critical care time)  Labs Reviewed - No data to display No results found.   No diagnosis found.    MDM  Patient with back pain.  No neurological deficits and normal neuro exam.  Patient can walk but states is painful.  No loss of bowel or bladder control.  No concern for cauda equina.  No fever, night sweats, weight loss, h/o cancer, IVDU.  RICE protocol and pain medicine indicated and discussed with patient.         Pascal Lux [place], PA-C 06/06/12 1528

## 2012-06-06 NOTE — Discharge Instructions (Signed)
Followup with orthopedics if symptoms continue. Use conservative methods at home including heat therapy and cold therapy as we discussed. More information on cold therapy is listed below.  It is not recommended to use heat treatment directly after an acute injury.  Take medications as prescribed.  Do not drive or operate heavy machinery while taking medications.  SEEK IMMEDIATE MEDICAL ATTENTION IF: New numbness, tingling, weakness, or problem with the use of your arms or legs.  Severe back pain not relieved with medications.  Change in bowel or bladder control.  Increasing pain in any areas of the body (such as chest or abdominal pain).  Shortness of breath, dizziness or fainting.  Nausea (feeling sick to your stomach), vomiting, fever, or sweats.  COLD THERAPY DIRECTIONS:  Ice or gel packs can be used to reduce both pain and swelling. Ice is the most helpful within the first 24 to 48 hours after an injury or flareup from overusing a muscle or joint.  Ice is effective, has very few side effects, and is safe for most people to use.   If you expose your skin to cold temperatures for too long or without the proper protection, you can damage your skin or nerves. Watch for signs of skin damage due to cold.   HOME CARE INSTRUCTIONS  Follow these tips to use ice and cold packs safely.  Place a dry or damp towel between the ice and skin. A damp towel will cool the skin more quickly, so you may need to shorten the time that the ice is used.  For a more rapid response, add gentle compression to the ice.  Ice for no more than 10 to 20 minutes at a time. The bonier the area you are icing, the less time it will take to get the benefits of ice.  Check your skin after 5 minutes to make sure there are no signs of a poor response to cold or skin damage.  Rest 20 minutes or more in between uses.  Once your skin is numb, you can end your treatment. You can test numbness by very lightly touching your skin. The  touch should be so light that you do not see the skin dimple from the pressure of your fingertip. When using ice, most people will feel these normal sensations in this order: cold, burning, aching, and numbness.  Do not use ice on someone who cannot communicate their responses to pain, such as small children or people with dementia.   HOW TO MAKE AN ICE PACK  To make an ice pack, do one of the following:  Place crushed ice or a bag of frozen vegetables in a sealable plastic bag. Squeeze out the excess air. Place this bag inside another plastic bag. Slide the bag into a pillowcase or place a damp towel between your skin and the bag.  Mix 3 parts water with 1 part rubbing alcohol. Freeze the mixture in a sealable plastic bag. When you remove the mixture from the freezer, it will be slushy. Squeeze out the excess air. Place this bag inside another plastic bag. Slide the bag into a pillowcase or place a damp towel between your skin and the bag.   SEEK MEDICAL CARE IF:  You develop white spots on your skin. This may give the skin a blotchy (mottled) appearance.  Your skin turns blue or pale.  Your skin becomes waxy or hard.  Your swelling gets worse.  MAKE SURE YOU:  Understand these instructions.  Will  watch your condition.  Will get help right away if you are not doing well or get worse.    Chronic Pain Discharge Instructions  Emergency care providers appreciate that many patients coming to [place] are in severe pain and we wish to address their pain in the safest, most responsible manner.  It is important to recognize however, that the proper treatment of chronic pain differs from that of the pain of injuries and acute illnesses.  Our goal is to provide quality, safe, personalized care and we thank you for giving [place] the opportunity to serve you. The use of narcotics and related agents for chronic pain syndromes may lead to additional physical and psychological problems.  Nearly as many people die from  prescription narcotics each year as die from car crashes.  Additionally, this risk is increased if such prescriptions are obtained from a variety of sources.  Therefore, only your primary care physician or a pain management specialist is able to safely treat such syndromes with narcotic medications long-term.    Documentation revealing such prescriptions have been sought from multiple sources may prohibit [place] from providing a refill or different narcotic medication.  Your name may be checked first through the [facility] Controlled Substances Reporting System.  This database is a record of controlled substance medication prescriptions that the patient has received.  This has been established by [facility] in an effort to eliminate the dangerous, and often life threatening, practice of obtaining multiple prescriptions from different medical providers.   If you have a chronic pain syndrome (i.e. chronic headaches, recurrent back or neck pain, dental pain, abdominal or pelvis pain without a specific diagnosis, or neuropathic pain such as fibromyalgia) or recurrent visits for the same condition without an acute diagnosis, you may be treated with non-narcotics and other non-addictive medicines.  Allergic reactions or negative side effects that may be reported by a patient to such medications will not typically lead to the use of a narcotic analgesic or other controlled substance as an alternative.   Patients managing chronic pain with a personal physician should have provisions in place for breakthrough pain.  If you are in crisis, you should call your physician.  If your physician directs you to the emergency department, please have the doctor call and speak to our attending physician concerning your care.   When patients come to the Emergency Department (ED) with acute medical conditions in which the Emergency Department physician feels appropriate to prescribe narcotic or sedating pain medication, the  physician will prescribe these in very limited quantities.  The amount of these medications will last only until you can see your primary care physician in his/her office.  Any patient who returns to the ED seeking refills should expect only non-narcotic pain medications.   In the event of an acute medical condition exists and the emergency physician feels it is necessary that the patient be given a narcotic or sedating medication -  a responsible adult driver should be present in the room prior to the medication being given by the nurse.   Prescriptions for narcotic or sedating medications that have been lost, stolen or expired will not be refilled in the Emergency Department.    Patients who have chronic pain may receive non-narcotic prescriptions until seen by their primary care physician.  It is every patient's personal responsibility to maintain active prescriptions with his or her primary care physician or specialist.

## 2012-06-06 NOTE — ED Notes (Signed)
Low back and neck pain x 8 days. Hx of recurrent back pan

## 2012-06-18 ENCOUNTER — Encounter (HOSPITAL_COMMUNITY): Payer: Self-pay | Admitting: Emergency Medicine

## 2012-06-18 ENCOUNTER — Observation Stay (HOSPITAL_COMMUNITY)
Admission: EM | Admit: 2012-06-18 | Discharge: 2012-06-19 | Disposition: A | Discharge status: Home / Self Care | Payer: Self-pay | Attending: Emergency Medicine | Admitting: Emergency Medicine

## 2012-06-18 DIAGNOSIS — F19239 Other psychoactive substance dependence with withdrawal, unspecified: Secondary | ICD-10-CM | POA: Insufficient documentation

## 2012-06-18 DIAGNOSIS — F1120 Opioid dependence, uncomplicated: Secondary | ICD-10-CM | POA: Insufficient documentation

## 2012-06-18 DIAGNOSIS — F19939 Other psychoactive substance use, unspecified with withdrawal, unspecified: Principal | ICD-10-CM | POA: Insufficient documentation

## 2012-06-18 DIAGNOSIS — G479 Sleep disorder, unspecified: Secondary | ICD-10-CM | POA: Insufficient documentation

## 2012-06-18 DIAGNOSIS — R197 Diarrhea, unspecified: Secondary | ICD-10-CM | POA: Insufficient documentation

## 2012-06-18 DIAGNOSIS — F1910 Other psychoactive substance abuse, uncomplicated: Secondary | ICD-10-CM

## 2012-06-18 DIAGNOSIS — IMO0001 Reserved for inherently not codable concepts without codable children: Secondary | ICD-10-CM | POA: Insufficient documentation

## 2012-06-18 DIAGNOSIS — F17200 Nicotine dependence, unspecified, uncomplicated: Secondary | ICD-10-CM | POA: Insufficient documentation

## 2012-06-18 DIAGNOSIS — F411 Generalized anxiety disorder: Secondary | ICD-10-CM | POA: Insufficient documentation

## 2012-06-18 HISTORY — DX: Other chronic pain: G89.29

## 2012-06-18 HISTORY — DX: Opioid dependence, uncomplicated: F11.20

## 2012-06-18 LAB — CBC WITH DIFFERENTIAL/PLATELET
Basophils Absolute: 0 10*3/uL (ref 0.0–0.1)
Basophils Relative: 0 % (ref 0–1)
Eosinophils Absolute: 0.1 10*3/uL (ref 0.0–0.7)
Eosinophils Relative: 1 % (ref 0–5)
HCT: 38.6 % (ref 36.0–46.0)
Hemoglobin: 13.3 g/dL (ref 12.0–15.0)
Lymphocytes Relative: 36 % (ref 12–46)
Lymphs Abs: 2.2 10*3/uL (ref 0.7–4.0)
MCH: 31.8 pg (ref 26.0–34.0)
MCHC: 34.5 g/dL (ref 30.0–36.0)
MCV: 92.3 fL (ref 78.0–100.0)
Monocytes Absolute: 0.5 10*3/uL (ref 0.1–1.0)
Monocytes Relative: 8 % (ref 3–12)
Neutro Abs: 3.3 10*3/uL (ref 1.7–7.7)
Neutrophils Relative %: 55 % (ref 43–77)
Platelets: 176 10*3/uL (ref 150–400)
RBC: 4.18 MIL/uL (ref 3.87–5.11)
RDW: 12.1 % (ref 11.5–15.5)
WBC: 6 10*3/uL (ref 4.0–10.5)

## 2012-06-18 LAB — COMPREHENSIVE METABOLIC PANEL
ALT: 11 U/L (ref 0–35)
AST: 16 U/L (ref 0–37)
Albumin: 4.1 g/dL (ref 3.5–5.2)
Alkaline Phosphatase: 46 U/L (ref 39–117)
BUN: 6 mg/dL (ref 6–23)
CO2: 27 mEq/L (ref 19–32)
Calcium: 9.3 mg/dL (ref 8.4–10.5)
Chloride: 101 mEq/L (ref 96–112)
Creatinine, Ser: 0.82 mg/dL (ref 0.50–1.10)
GFR calc Af Amer: >90 mL/min (ref >90)
GFR calc non Af Amer: 81 mL/min — ABNORMAL LOW (ref >90)
Glucose, Bld: 79 mg/dL (ref 70–99)
Potassium: 3.6 mEq/L (ref 3.5–5.1)
Sodium: 139 mEq/L (ref 135–145)
Total Bilirubin: 0.1 mg/dL — ABNORMAL LOW (ref 0.3–1.2)
Total Protein: 7 g/dL (ref 6.0–8.3)

## 2012-06-18 LAB — RAPID URINE DRUG SCREEN, HOSP PERFORMED
Amphetamines: NOT DETECTED
Barbiturates: NOT DETECTED
Benzodiazepines: NOT DETECTED
Cocaine: NOT DETECTED
Opiates: NOT DETECTED
Tetrahydrocannabinol: NOT DETECTED

## 2012-06-18 LAB — URINALYSIS, ROUTINE W REFLEX MICROSCOPIC
Bilirubin Urine: NEGATIVE
Glucose, UA: NEGATIVE mg/dL
Ketones, ur: NEGATIVE mg/dL
Nitrite: NEGATIVE
Protein, ur: NEGATIVE mg/dL
Specific Gravity, Urine: 1.015 (ref 1.005–1.030)
Urobilinogen, UA: 0.2 mg/dL (ref 0.0–1.0)
pH: 5.5 (ref 5.0–8.0)

## 2012-06-18 LAB — URINE MICROSCOPIC-ADD ON

## 2012-06-18 LAB — SALICYLATE LEVEL: Salicylate Lvl: 15.9 mg/dL (ref 2.8–20.0)

## 2012-06-18 LAB — ETHANOL: Alcohol, Ethyl (B): <11 mg/dL (ref 0–11)

## 2012-06-18 LAB — ACETAMINOPHEN LEVEL: Acetaminophen (Tylenol), Serum: <15 ug/mL (ref 10–30)

## 2012-06-18 MED ORDER — GABAPENTIN 400 MG PO CAPS
400.0000 mg | ORAL_CAPSULE | Freq: Three times a day (TID) | ORAL | Status: DC
  Administered 2012-06-18 (×2): 400 mg via ORAL
  Filled 2012-06-18 (×7): qty 1

## 2012-06-18 MED ORDER — DIAZEPAM 5 MG/ML IJ SOLN
5.0000 mg | Freq: Once | INTRAMUSCULAR | Status: AC
  Administered 2012-06-18: 5 mg via INTRAMUSCULAR
  Filled 2012-06-18: qty 2

## 2012-06-18 MED ORDER — TRAZODONE HCL 100 MG PO TABS
300.0000 mg | ORAL_TABLET | Freq: Every day | ORAL | Status: DC
  Administered 2012-06-18: 200 mg via ORAL
  Filled 2012-06-18 (×2): qty 1

## 2012-06-18 NOTE — ED Notes (Signed)
In jail from May 10 until June 2, not drinking since May. Living conditions after jail poor, found job and had to quit. Now living in recovery house.

## 2012-06-18 NOTE — ED Notes (Signed)
Pt states she is here because she recently detoxed herself from medication (last opiate use this past Sunday) and she is afraid if she returns to the streets (she is homeless), she will start using again.  Pt stopped using ETOH in May.  Pt denies being suicidal, but states, "If I go back out and start using, I'll die."  Pt wants to know if she can go to [facility] and if she isn't accepted as a patient there, "Where will I go?  I don't have anywhere to go.  Should I say I'm suicidal so I can stay."  Pt was informed that she should say she is suicidal if she is, but not to make things up.  Pt was also informed she will be assessed by an ACT person later on in the next shift.  Pt denies HI.  Pt also stated, "I need some help, I've got some things to work through in my head."  Pt implied the thoughts she refers to are using drugs again.
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
## 2012-06-18 NOTE — ED Notes (Signed)
Bed:WA31<BR> Expected date:<BR> Expected time:<BR> Means of arrival:<BR> Comments:<BR> CLOSED

## 2012-06-19 NOTE — ED Notes (Signed)
Report given by night nurse. Pt is currently resting. No s/s of distress noted. Will cont to monitor.

## 2012-06-19 NOTE — ED Provider Notes (Signed)
Discussed w act team - they indicate last used opiates > 6 days ago, and not candidate for inpatient detox. They have evaluated and recommend d/c with resource guide.  Recheck pt calm, resting. Nad. No tachycardia or tachypnea. No nv. Pt stable for d/c. Will encourage to use community resources re rehab. Also rec close pcp f/u.   Suzi Roots, MD 06/19/12 [phone]
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
## 2012-06-19 NOTE — ED Notes (Signed)
Patient request to speak to mother who was waiting in waiting room. After speaking to mother patient seem to feel better. Resting in room now.

## 2012-06-19 NOTE — ED Notes (Signed)
Report given to oncoming nurse-Feng, RN

## 2012-06-19 NOTE — Discharge Instructions (Signed)
Do not use drugs and/or alcohol.  Follow up with NA, AA, and use resource guide provide regarding community resources available for substance abuse rehab.  Also follow up with primary care doctor in coming week. Return to ER if worse, new symptoms, other concern.      Chemical Dependency Chemical dependency is an addiction to drugs or alcohol. It is characterized by the repeated behavior of seeking out and using drugs and alcohol despite harmful consequences to the health and safety of ones self and others.  RISK FACTORS There are certain situations or behaviors that increase a person's risk for chemical dependency. These include:  A family history of chemical dependency.   A history of mental health issues, including depression and anxiety.   A home environment where drugs and alcohol are easily available to you.   Drug or alcohol use at a young age.  SYMPTOMS  The following symptoms can indicate chemical dependency:  Inability to limit the use of drugs or alcohol.   Nausea, sweating, shakiness, and anxiety that occurs when alcohol or drugs are not being used.   An increase in amount of drugs or alcohol that is necessary to get drunk or high.  People who experience these symptoms can assess their use of drugs and alcohol by asking themselves the following questions:  Have you been told by friends or family that they are worried about your use of alcohol or drugs?   Do friends and family ever tell you about things you did while drinking alcohol or using drugs that you do not remember?   Do you lie about using alcohol or drugs or about the amounts you use?   Do you have difficulty completing daily tasks unless you use alcohol or drugs?   Is the level of your work or school performance lower because of your drug or alcohol use?   Do you get sick from using drugs or alcohol but keep using anyway?   Do you feel uncomfortable in social situations unless you use alcohol or drugs?    Do you use drugs or alcohol to help forget problems?  An answer of yes to any of these questions may indicate chemical dependency. Professional evaluation is suggested. Document Released: 11/10/2001 Document Revised: 11/05/2011 Document Reviewed: 01/22/2011 [facility] Patient Information 2012 [place], [place].      Substance Abuse Your exam indicates that you have a problem with substance abuse. Substance abuse is the misuse of alcohol or drugs that causes problems in family life, friendships, and work relationships. Substance abuse is the most important cause of premature illness, disability, and death in our society. It is also the greatest threat to a person's mental and spiritual well being. Substance abuse can start out in an innocent way, such as social drinking or taking a little extra medication prescribed by your doctor. No one starts out with the intention of becoming an alcoholic or an addict. Substance abuse victims cannot control their use of alcohol or drugs. They may become intoxicated daily or go on weekend binges. Often there is a strong desire to quit, but attempts to stop using often fail. Encounters with law enforcement or conflicts with family members, friends, and work associates are signs of a potential problem. Recovery is always possible, although the craving for some drugs makes it difficult to quit without assistance. Many treatment programs are available to help people stop abusing alcohol or drugs. The first step in treatment is to admit you have a problem. This is a  major hurdle because denial is a powerful force with substance abuse. Alcoholics Anonymous, Narcotics Anonymous, Cocaine Anonymous, and other recovery groups and programs can be very useful in helping people to quit. If you do not feel okay about your drug or alcohol use and if it is causing you trouble, we want to encourage you to talk about it with your doctor or with someone from a recovery group who can  help you. You could also call the [employer] on Drug Abuse at 1-800-662-HELP. It is up to you to take the first step. AL-ANON and ALA-TEEN are support groups for friends and family members of an alcohol or drug dependent person. The people who love and care for the alcoholic or addicted person often need help, too. For information about these organizations, check your phone directory or call a local alcohol or drug treatment center. Document Released: 12/24/2004 Document Revised: 11/05/2011 Document Reviewed: 11/17/2008 [facility] Patient Information 2012 [place], [place].       RESOURCE GUIDE  Chronic Pain Problems: Contact [facility]  [phone] Patients need to be referred by their primary care doctor.  Insufficient Money for Medicine: Contact United Way:  call "211" or [facility] 314-036-6273.  No Primary Care Doctor: - Call [facility]  [phone] - can help you locate a primary care doctor that  accepts your insurance, provides certain services, etc. - Physician Referral Service- [phone]  Agencies that provide inexpensive medical care: - [facility]  [phone] - [facility]  [phone] - [facility]  [phone] [facility]  [phone] - Planned Parenthood  [phone] [facility]  [phone]  Medicaid-accepting [facility] Providers: - [facility]- [address], [address]  [phone], Mon-Fri 9am-7pm, Sat 9am-1pm - [facility]- [address] [place], [address] [place]  [phone] - [facility]- [address], [address] [place]  [phone] [facility]- [address]  [phone] - Renaye Rakers- [address] [place], [address], [phone]  Only accepts [place] [facility] [place] patients after they have their name  applied to their card  Self Pay (no insurance) in [place]: - Sickle Cell Patients: Dr [name], [facility] Internal Medicine  [address] [place], [phone] - [facility]- [address] [place]  [phone]       [facility]- [address]       -     [facility]- see information above (Speak to [employer] if you do not have insurance)       -  [facility]- [address] [place], [phone]       -  [facility]- [address],  [phone]       -  [facility]- [address] [phone]       -  Dr Julio Sicks-  [address], [place], [phone]       -  [facility]- [address], [phone]       -  [facility]- [address], [phone], also [address], [phone]       -    [facility]- [address] [place], [phone], 1st & 3rd Saturday   every month, 10am-1pm  1) Find a Librarian, academic  and Pay Out of Pocket Although you won't have to find out who is covered by your insurance plan, it is a good idea to ask around and get recommendations. You will then need to call the office and see if the doctor you have chosen will accept you as a new patient and what types of options they offer for patients who are self-pay. Some doctors offer discounts or will set up payment plans for their patients who do not have insurance, but you will need to ask so you aren't surprised when you get to your appointment.  2) Contact Your Local Health Department Not all health departments have doctors that can see patients for sick visits, but many do, so it is worth a call to see if yours does. If you don't know where your local health department is, you can check in your phone book. The CDC also has a tool to help you locate your state's health department, and many state websites also have listings of all of their local health departments.  3) Find a Walk-in Clinic If your illness is not likely to be very severe or complicated, you may want to try a walk in clinic. These are  popping up all over the country in pharmacies, drugstores, and shopping centers. They're usually staffed by nurse practitioners or physician assistants that have been trained to treat common illnesses and complaints. They're usually fairly quick and inexpensive. However, if you have serious medical issues or chronic medical problems, these are probably not your best option  STD Testing - [facility] Department of [facility] [place], STD Clinic, [address] [place], phone [phone] or [phone].  Monday - Friday, call for an appointment. [facility] Department of [employer], STD Clinic, [address], [place], phone [phone] or [phone].  Monday - Friday, call for an appointment.  Abuse/Neglect: [facility] Child Abuse Hotline 980-728-5639 [facility] Child Abuse Hotline 239-782-9159 (After Hours)  Emergency Shelter:  [facility] 334-035-3792  Maternity Homes: - Room at the [place] [facility] [phone] - [facility] ([phone]  MRSA Hotline #:   2070340089  [facility] Resources  [facility]  United Way [facility] Dept. [address]                 [address]         [address] [place] [address]  Blondell Reveal Phone:  [phone]                                  Phone:  [phone]                   Phone:  [phone]  [facility] [place] [facility] Mental Health, [phone] - [phone]       -     [facility] in [place], [place]  [address],                                  [phone], Insurance  [place] Child Abuse Hotline (640)461-9437 or 3183074259 (After Hours)   Behavioral Health Services  Substance Abuse Resources: - [facility] Drug Services   332-857-9803 - [facility] [phone] - [facility] [place] [phone] Floydene Flock (743)[phone] - Residential & Outpatient Substance Abuse Program  (825)465-2492  Psychological Services: [facility]  [phone] [facility] Services  [phone] - [facility], [phone] [place]. [address] [place], ACCESS LINE: 251-228-8549 or 917-066-8678, [email or web address]  Dental Assistance  If unable to pay or uninsured, contact:  [facility] or [facility]. to become qualified for the adult dental clinic.  Patients with Medicaid: [facility] [phone], [phone] [address], [phone]  If unable to pay, or uninsured, contact [facility] [phone]) or [facility] Department [phone] in [place], [phone] in [facility]) to become qualified for the adult dental clinic  Other Low-Cost Community Dental Services: - [facility]- [address] [place], [place], [place], [address], [phone], [address], 2nd and 4th Thursday of the month at 6:30am.  10 clients each day by appointment, can sometimes see walk-in patients if someone does not show for an appointment. [facility]- [address] [place], [place], [address], [phone] - [facility]- [address], [place], [place], [address], [phone] - [facility]- [phone] [facility] Health Department- [phone] [facility] Department- [phone]

## 2012-06-22 NOTE — ED Provider Notes (Signed)
History/physical exam/procedure(s) were performed by non-physician practitioner and as supervising physician I was immediately available for consultation/collaboration. I have reviewed all notes and am in agreement with care and plan.   Susann Lawhorne S Josie Mesa, MD 06/22/12 0758 

## 2012-08-22 ENCOUNTER — Emergency Department (HOSPITAL_COMMUNITY)
Admission: EM | Admit: 2012-08-22 | Discharge: 2012-08-22 | Disposition: A | Discharge status: Home / Self Care | Payer: Self-pay | Attending: Emergency Medicine | Admitting: Emergency Medicine

## 2012-08-22 ENCOUNTER — Encounter (HOSPITAL_COMMUNITY): Payer: Self-pay | Admitting: Emergency Medicine

## 2012-08-22 DIAGNOSIS — T43294A Poisoning by other antidepressants, undetermined, initial encounter: Secondary | ICD-10-CM | POA: Insufficient documentation

## 2012-08-22 DIAGNOSIS — F411 Generalized anxiety disorder: Secondary | ICD-10-CM | POA: Insufficient documentation

## 2012-08-22 DIAGNOSIS — T43591A Poisoning by other antipsychotics and neuroleptics, accidental (unintentional), initial encounter: Secondary | ICD-10-CM | POA: Insufficient documentation

## 2012-08-22 DIAGNOSIS — F1010 Alcohol abuse, uncomplicated: Secondary | ICD-10-CM | POA: Insufficient documentation

## 2012-08-22 DIAGNOSIS — F4310 Post-traumatic stress disorder, unspecified: Secondary | ICD-10-CM | POA: Insufficient documentation

## 2012-08-22 DIAGNOSIS — F17200 Nicotine dependence, unspecified, uncomplicated: Secondary | ICD-10-CM | POA: Insufficient documentation

## 2012-08-22 DIAGNOSIS — Z881 Allergy status to other antibiotic agents status: Secondary | ICD-10-CM | POA: Insufficient documentation

## 2012-08-22 DIAGNOSIS — Z833 Family history of diabetes mellitus: Secondary | ICD-10-CM | POA: Insufficient documentation

## 2012-08-22 DIAGNOSIS — F319 Bipolar disorder, unspecified: Secondary | ICD-10-CM | POA: Insufficient documentation

## 2012-08-22 DIAGNOSIS — Z8249 Family history of ischemic heart disease and other diseases of the circulatory system: Secondary | ICD-10-CM | POA: Insufficient documentation

## 2012-08-22 DIAGNOSIS — T50901A Poisoning by unspecified drugs, medicaments and biological substances, accidental (unintentional), initial encounter: Secondary | ICD-10-CM

## 2012-08-22 DIAGNOSIS — Z888 Allergy status to other drugs, medicaments and biological substances status: Secondary | ICD-10-CM | POA: Insufficient documentation

## 2012-08-22 HISTORY — DX: Bipolar disorder, unspecified: F31.9

## 2012-08-22 LAB — COMPREHENSIVE METABOLIC PANEL
ALT: 20 U/L (ref 0–35)
AST: 20 U/L (ref 0–37)
Albumin: 4.4 g/dL (ref 3.5–5.2)
Alkaline Phosphatase: 55 U/L (ref 39–117)
BUN: 10 mg/dL (ref 6–23)
CO2: 28 mEq/L (ref 19–32)
Calcium: 9.2 mg/dL (ref 8.4–10.5)
Chloride: 101 mEq/L (ref 96–112)
Creatinine, Ser: 0.68 mg/dL (ref 0.50–1.10)
GFR calc Af Amer: >90 mL/min (ref >90)
GFR calc non Af Amer: >90 mL/min (ref >90)
Glucose, Bld: 85 mg/dL (ref 70–99)
Potassium: 3.8 mEq/L (ref 3.5–5.1)
Sodium: 138 mEq/L (ref 135–145)
Total Bilirubin: 0.2 mg/dL — ABNORMAL LOW (ref 0.3–1.2)
Total Protein: 7 g/dL (ref 6.0–8.3)

## 2012-08-22 LAB — ETHANOL: Alcohol, Ethyl (B): <11 mg/dL (ref 0–11)

## 2012-08-22 LAB — CBC WITH DIFFERENTIAL/PLATELET
Basophils Absolute: 0 10*3/uL (ref 0.0–0.1)
Basophils Relative: 0 % (ref 0–1)
Eosinophils Absolute: 0 10*3/uL (ref 0.0–0.7)
Eosinophils Relative: 0 % (ref 0–5)
HCT: 40.5 % (ref 36.0–46.0)
Hemoglobin: 13.8 g/dL (ref 12.0–15.0)
Lymphocytes Relative: 24 % (ref 12–46)
Lymphs Abs: 1.1 10*3/uL (ref 0.7–4.0)
MCH: 32.1 pg (ref 26.0–34.0)
MCHC: 34.1 g/dL (ref 30.0–36.0)
MCV: 94.2 fL (ref 78.0–100.0)
Monocytes Absolute: 0.3 10*3/uL (ref 0.1–1.0)
Monocytes Relative: 6 % (ref 3–12)
Neutro Abs: 3.2 10*3/uL (ref 1.7–7.7)
Neutrophils Relative %: 69 % (ref 43–77)
Platelets: 157 10*3/uL (ref 150–400)
RBC: 4.3 MIL/uL (ref 3.87–5.11)
RDW: 12.8 % (ref 11.5–15.5)
WBC: 4.6 10*3/uL (ref 4.0–10.5)

## 2012-08-22 LAB — LITHIUM LEVEL: Lithium Lvl: 0.25 mEq/L — ABNORMAL LOW (ref 0.80–1.40)

## 2012-08-22 LAB — ACETAMINOPHEN LEVEL: Acetaminophen (Tylenol), Serum: <15 ug/mL (ref 10–30)

## 2012-08-22 NOTE — Discharge Instructions (Signed)
Call your primary care provider for potential changes in your lithium dosing. Followup with Alden Hipp' Jennette Kettle in a week.  Return to the ER for worsening symptoms, thoughts of suicide, or any other concerning symptoms.  Nontoxic Ingestion Your exam shows your ingestion is not likely to cause serious medical problems. Further treatment is not needed at this time. If you have vomited since your ingestion, you should not drink or eat for at least 2 to 3 hours. Then start with small sips of clear liquids until your stomach settles. You should not drink alcohol or take illegal recreational drugs or other mind-altering substances as this may worsen your condition. Sometimes the effects of drugs and other substances can be delayed. SEEK IMMEDIATE MEDICAL CARE IF:  You develop confusion, sleepiness, agitation, or difficulty walking.   You develop breathing problems, a cough, difficulty swallowing, or excess mucus.   You develop a stomach ache, repeated vomiting, or severe diarrhea.   You develop weakness, fever, or dehydration.  Document Released: 12/24/2004 Document Revised: 11/05/2011 Document Reviewed: 12/17/2008 [facility] Patient Information 2012 [place], [place].

## 2012-08-22 NOTE — ED Notes (Signed)
Bed:RESB<BR> Expected date:<BR> Expected time:<BR> Means of arrival:<BR> Comments:<BR>

## 2012-08-22 NOTE — ED Notes (Addendum)
EMS reports patient ingested 8 100mg  Trazadone to try and sleep.  Patient denies HI or SI.  Patient's prescription was filled on 8/29 and 10 pills are missing- patient states she took 2 pills a couple days ago to try and sleep.  Patient states she doesn't know why she did it, and tried to make herself vomit.  EMS reports vitals are stable and patient reports feeling groggy.  EMS reports patient was anxious on scene.

## 2012-08-22 NOTE — ED Provider Notes (Signed)
History     CSN: [id number]  Arrival date & time 08/22/12  1110   First MD Initiated Contact with Patient 08/22/12 1121      Chief Complaint  Patient presents with  . Ingestion    trazadone    (Consider location/radiation/quality/duration/timing/severity/associated sxs/prior treatment) HPIEmery Hunter is a 51 y.o. female the past medical history significant for alcoholism, opiate abuse anxiety and bipolar disorder who took 800 mg of trazodone this morning because "I just wanted to sleep". Patient denies any suicidal or homicidal ideation. Patient has not used opiates since June and has been free of alcohol per month, she is currently living in a group home with other women. Patient says she's been unable to sleep, and that her depression has been ordered to severe even with the addition of lithium recently. She says after she took the medicine, she tried to vomit back up but was unsuccessful in getting herself, at this point she says she noticed a little bit of anxiety with a rapid heartbeat and rapid breathing, the symptoms have resolved. She denies any chest pains, dizziness, lightheadedness, shortness of breath.  She complains right now only that she is hungry and sleepy.    Past Medical History  Diagnosis Date  . Fibromyalgia   . Depression   . Anxiety   . PTSD (post-traumatic stress disorder)   . Alcohol abuse   . Arthritis   . Chronic pain   . Opiate dependence   . Bipolar disorder     Past Surgical History  Procedure Date  . Fibula fracture surgery     Family History  Problem Relation Age of Onset  . Diabetes Mother   . Hypertension Other     History  Substance Use Topics  . Smoking status: Current Every Day Smoker -- 1.0 packs/day    Types: Cigarettes  . Smokeless tobacco: Never Used  . Alcohol Use: Yes     no ETOH since May    OB History    Grav Para Term Preterm Abortions TAB SAB Ect Mult Living                  Review of SystemsAt least 10pt or  greater review of systems completed and are negative except where specified in the HPI.   Allergies  Demerol and Erythromycin  Home Medications   Current Outpatient Rx  Name Route Sig Dispense Refill  . IBUPROFEN 200 MG PO TABS Oral Take 200 mg by mouth every 6 (six) hours as needed. Pain      . LAMOTRIGINE 100 MG PO TABS Oral Take 150 mg by mouth daily. Pt's on a taper dose, pt is currently on 50 mg once daily for 7 days. Pt will start lamictal 100 mg on 06-07-12. For mood    . LITHIUM CARBONATE 300 MG PO CAPS Oral Take 300 mg by mouth 2 (two) times daily with a meal.    . TRAZODONE HCL 100 MG PO TABS Oral Take 300 mg by mouth at bedtime.     . VENLAFAXINE HCL 75 MG PO TABS Oral Take 225 mg by mouth daily.      BP 136/78  Pulse 90  Temp 97.7 F (36.5 C) (Oral)  Resp 16  Ht 5\' 5"  (1.651 m)  Wt 132 lb (59.875 kg)  BMI 21.97 kg/m2  SpO2 99%  Physical Exam  Nursing notes reviewed.  Electronic medical record reviewed. VITAL SIGNS:   Filed Vitals:   08/22/12 1119 08/22/12 1129  BP:  136/78  Pulse:  90  Temp:  97.7 F (36.5 C)  TempSrc:  Oral  Resp:  16  Height:  5\' 5"  (1.651 m)  Weight:  132 lb (59.875 kg)  SpO2: 99% 99%   CONSTITUTIONAL: Awake, oriented, appears non-toxic HENT: Atraumatic, normocephalic, oral mucosa pink and moist, airway patent. Nares patent without drainage. External ears normal. EYES: Conjunctiva clear, EOMI, PERRLA NECK: Trachea midline, non-tender, supple CARDIOVASCULAR: Normal heart rate, Normal rhythm, No murmurs, rubs, gallops PULMONARY/CHEST: Clear to auscultation, no rhonchi, wheezes, or rales. Symmetrical breath sounds. Non-tender. ABDOMINAL: Non-distended, soft, non-tender - no rebound or guarding.  BS normal. NEUROLOGIC: Non-focal, moving all four extremities, no gross sensory or motor deficits. EXTREMITIES: No clubbing, cyanosis, or edema SKIN: Warm, Dry, No erythema, No rash PSYCH: Depressed mood, flat affect, fair judgment, not reacting  to internal stimuli, denies suicidal homicidal ideation denies hallucinations. No obvious delusions   ED Course  Procedures (including critical care time)  Labs Reviewed  COMPREHENSIVE METABOLIC PANEL - Abnormal; Notable for the following:    Total Bilirubin 0.2 (*)     All other components within normal limits  LITHIUM LEVEL - Abnormal; Notable for the following:    Lithium Lvl 0.25 (*)     All other components within normal limits  CBC WITH DIFFERENTIAL  ETHANOL  ACETAMINOPHEN LEVEL  LAB REPORT - SCANNED   No results found.   1. Accidental drug ingestion       MDM  [name] is a 51 y.o. female or 800 milligrams of trazodone to try and sleep 2 hours ago. Patient denies that this was a suicide attempt this once asleep, she immediately felt remorseful and stupid for doing so try to vomit up the medications but was unsuccessful and so she called EMS because she was scared. She thinks she anxiety attack which past because she took the medication.  Currently she is "sleepy" but is otherwise alert and oriented, and appears well.  I questioned the patient whether or not her depression is worsening or not resolving on medication, she is currently unsure and when asked a question does she think she needs an inpatient psychiatric evaluation, she had to think about it a while. We'll observe the patient for short time in the emergency department and I will pose the question again and see the patient needs psychiatric evaluation. At this point, I do not think she is a danger to herself however is possible that her depression could worsen. We'll check a lithium level as well as basic labs commensurate with medical clearance.  Also rule out Tylenol ingestion.    Laboratory results are within normal limits - lithium is low, she does admit to discontinuing her lithium due to adverse reactions of feeling jittery and has not taken it for 4 days. She did call her primary care provider about this today  but have not heard back from them yet.  Patient reevaluated. She is easily arousable, she is eating and is asking to go home.  Patient is stable, she is alert, she is is mildly sleepy, and she is safe for discharge, she will followup with her primary care provider and discuss changing her lithium dosage. She again denies any suicidal homicidal ideation, she says she is depressed but she feels safe going home with her current treatment plan.  I explained the diagnosis and have given explicit precautions to return to the ER including worsening depressive symptoms or any other new or worsening symptoms. The patient understands and  accepts the medical plan as it's been dictated and I have answered their questions. Discharge instructions concerning home care and prescriptions have been given.  The patient is STABLE and is discharged to home in good condition.         Jones Skene, MD 08/23/12 [phone]

## 2013-04-18 ENCOUNTER — Emergency Department (HOSPITAL_COMMUNITY)
Admission: EM | Admit: 2013-04-18 | Discharge: 2013-04-18 | Disposition: A | Discharge status: Home / Self Care | Payer: Self-pay | Attending: Emergency Medicine | Admitting: Emergency Medicine

## 2013-04-18 ENCOUNTER — Encounter (HOSPITAL_COMMUNITY): Payer: Self-pay | Admitting: Emergency Medicine

## 2013-04-18 DIAGNOSIS — F319 Bipolar disorder, unspecified: Secondary | ICD-10-CM | POA: Insufficient documentation

## 2013-04-18 DIAGNOSIS — M797 Fibromyalgia: Secondary | ICD-10-CM

## 2013-04-18 DIAGNOSIS — R42 Dizziness and giddiness: Secondary | ICD-10-CM | POA: Insufficient documentation

## 2013-04-18 DIAGNOSIS — R110 Nausea: Secondary | ICD-10-CM | POA: Insufficient documentation

## 2013-04-18 DIAGNOSIS — R635 Abnormal weight gain: Secondary | ICD-10-CM | POA: Insufficient documentation

## 2013-04-18 DIAGNOSIS — F3289 Other specified depressive episodes: Secondary | ICD-10-CM | POA: Insufficient documentation

## 2013-04-18 DIAGNOSIS — F329 Major depressive disorder, single episode, unspecified: Secondary | ICD-10-CM | POA: Insufficient documentation

## 2013-04-18 DIAGNOSIS — M129 Arthropathy, unspecified: Secondary | ICD-10-CM | POA: Insufficient documentation

## 2013-04-18 DIAGNOSIS — F1010 Alcohol abuse, uncomplicated: Secondary | ICD-10-CM | POA: Insufficient documentation

## 2013-04-18 DIAGNOSIS — R631 Polydipsia: Secondary | ICD-10-CM | POA: Insufficient documentation

## 2013-04-18 DIAGNOSIS — IMO0001 Reserved for inherently not codable concepts without codable children: Secondary | ICD-10-CM | POA: Insufficient documentation

## 2013-04-18 DIAGNOSIS — F4310 Post-traumatic stress disorder, unspecified: Secondary | ICD-10-CM | POA: Insufficient documentation

## 2013-04-18 DIAGNOSIS — H538 Other visual disturbances: Secondary | ICD-10-CM | POA: Insufficient documentation

## 2013-04-18 DIAGNOSIS — F1120 Opioid dependence, uncomplicated: Secondary | ICD-10-CM | POA: Insufficient documentation

## 2013-04-18 DIAGNOSIS — F411 Generalized anxiety disorder: Secondary | ICD-10-CM | POA: Insufficient documentation

## 2013-04-18 DIAGNOSIS — R3589 Other polyuria: Secondary | ICD-10-CM | POA: Insufficient documentation

## 2013-04-18 DIAGNOSIS — F17200 Nicotine dependence, unspecified, uncomplicated: Secondary | ICD-10-CM | POA: Insufficient documentation

## 2013-04-18 DIAGNOSIS — G8929 Other chronic pain: Secondary | ICD-10-CM | POA: Insufficient documentation

## 2013-04-18 DIAGNOSIS — R358 Other polyuria: Secondary | ICD-10-CM | POA: Insufficient documentation

## 2013-04-18 LAB — GLUCOSE, CAPILLARY: Glucose-Capillary: 123 mg/dL — ABNORMAL HIGH (ref 70–99)

## 2013-04-18 LAB — URINALYSIS, ROUTINE W REFLEX MICROSCOPIC
Bilirubin Urine: NEGATIVE
Glucose, UA: NEGATIVE mg/dL
Ketones, ur: NEGATIVE mg/dL
Nitrite: NEGATIVE
Protein, ur: NEGATIVE mg/dL
Specific Gravity, Urine: 1.011 (ref 1.005–1.030)
Urobilinogen, UA: 0.2 mg/dL (ref 0.0–1.0)
pH: 5.5 (ref 5.0–8.0)

## 2013-04-18 LAB — COMPREHENSIVE METABOLIC PANEL
ALT: 13 U/L (ref 0–35)
AST: 21 U/L (ref 0–37)
Albumin: 4.1 g/dL (ref 3.5–5.2)
Alkaline Phosphatase: 57 U/L (ref 39–117)
BUN: 11 mg/dL (ref 6–23)
CO2: 23 mEq/L (ref 19–32)
Calcium: 10.1 mg/dL (ref 8.4–10.5)
Chloride: 104 mEq/L (ref 96–112)
Creatinine, Ser: 0.88 mg/dL (ref 0.50–1.10)
GFR calc Af Amer: 86 mL/min — ABNORMAL LOW (ref >90)
GFR calc non Af Amer: 74 mL/min — ABNORMAL LOW (ref >90)
Glucose, Bld: 92 mg/dL (ref 70–99)
Potassium: 4.2 mEq/L (ref 3.5–5.1)
Sodium: 138 mEq/L (ref 135–145)
Total Bilirubin: 0.1 mg/dL — ABNORMAL LOW (ref 0.3–1.2)
Total Protein: 7.1 g/dL (ref 6.0–8.3)

## 2013-04-18 LAB — CBC
HCT: 40.2 % (ref 36.0–46.0)
Hemoglobin: 13.3 g/dL (ref 12.0–15.0)
MCH: 31.4 pg (ref 26.0–34.0)
MCHC: 33.1 g/dL (ref 30.0–36.0)
MCV: 95 fL (ref 78.0–100.0)
Platelets: 205 10*3/uL (ref 150–400)
RBC: 4.23 MIL/uL (ref 3.87–5.11)
RDW: 12.3 % (ref 11.5–15.5)
WBC: 7.4 10*3/uL (ref 4.0–10.5)

## 2013-04-18 LAB — URINE MICROSCOPIC-ADD ON

## 2013-04-18 LAB — LIPASE, BLOOD: Lipase: 36 U/L (ref 11–59)

## 2013-04-18 MED ORDER — LORAZEPAM 2 MG/ML IJ SOLN
1.0000 mg | Freq: Once | INTRAMUSCULAR | Status: AC
  Administered 2013-04-18: 1 mg via INTRAVENOUS
  Filled 2013-04-18: qty 1

## 2013-04-18 MED ORDER — AMITRIPTYLINE HCL 150 MG PO TABS: 150.0000 mg | ORAL_TABLET | Freq: Every day | ORAL | Status: DC

## 2013-04-18 MED ORDER — ONDANSETRON HCL 4 MG PO TABS: 4.0000 mg | ORAL_TABLET | Freq: Four times a day (QID) | ORAL | Status: DC

## 2013-04-18 MED ORDER — SODIUM CHLORIDE 0.9 % IV BOLUS (SEPSIS)
1000.0000 mL | Freq: Once | INTRAVENOUS | Status: AC
  Administered 2013-04-18: 1000 mL via INTRAVENOUS

## 2013-04-18 MED ORDER — ONDANSETRON HCL 4 MG/2ML IJ SOLN
4.0000 mg | Freq: Once | INTRAMUSCULAR | Status: AC
  Administered 2013-04-18: 4 mg via INTRAVENOUS
  Filled 2013-04-18: qty 2

## 2013-04-18 MED ORDER — KETOROLAC TROMETHAMINE 30 MG/ML IJ SOLN
30.0000 mg | Freq: Once | INTRAMUSCULAR | Status: AC
  Administered 2013-04-18: 30 mg via INTRAVENOUS
  Filled 2013-04-18: qty 1

## 2013-04-18 NOTE — Discharge Instructions (Signed)
Follow up with one of the resources below to establish care with a primary care doctor.   RESOURCE GUIDE  Chronic Pain Problems: Contact [facility]  [phone] Patients need to be referred by their primary care doctor.  Insufficient Money for Medicine: Contact [employer]:  call "211."   No Primary Care Doctor: - Call [facility]  [phone] - can help you locate a primary care doctor that  accepts your insurance, provides certain services, etc. - Physician Referral Service- [phone]  Agencies that provide inexpensive medical care: - [facility]  [phone] - [facility]  [phone] - [facility]  [phone] - [facility]  [phone] - [facility]  [phone] - [facility]  [phone]  Medicaid-accepting [facility] Providers: - [facility]- [address]  [phone], Mon-Fri 9am-7pm, Sat 9am-1pm - [facility]- [address] [place], [address] [place]  [phone] - [facility]- [address], [address] [place]  [phone] [facility]- [address]  [phone] - Renaye Rakers- [address] [place], [address], [phone]  Only accepts [place] patients after they have their name  applied to their card  Self Pay (no insurance) in [place]: - Sickle Cell Patients: Dr [name], [facility]  [address] [place], [phone] - [facility]- [address] [place]  [phone]       [facility]- [address]       -     [facility]- see information above (Speak to [employer] if you do not have insurance)       -  [facility]- [address],  [phone]       -  [facility]- [address], [phone]       -  Dr Julio Sicks-  [address], [place], [phone]       -  [facility] -  [address] [phone]       -  [facility]- [address] [phone], also [address], [phone]       -    [facility]- [address] [place], [phone], 1st & 3rd Saturday        every month, 10am-1pm  1) Find a Doctor and Pay Out of Pocket Although you won't have to find out who is covered by your insurance plan, it is a good idea to ask around and get recommendations. You will then need to call the office and see if the doctor you have chosen will accept you as a new patient and what types of options they offer for patients who are self-pay. Some doctors offer discounts or will set up payment plans for their patients who do not have insurance, but you will need to ask so you aren't surprised when you get to your appointment.  2) Contact Your Local Health Department Not all health departments have doctors that can see patients for sick visits, but many do, so it is worth a call to see if yours does. If you don't know where your local health department is, you can check in your phone book. The CDC also has a tool to help you locate your state's health department, and many  state websites also have listings of all of their local health departments.  3) Find a Walk-in Clinic If your illness is not likely to be very severe or complicated, you may want to try a walk in clinic. These are popping up all over the country in pharmacies, drugstores, and shopping centers. They're usually staffed by nurse practitioners or physician assistants that have been trained to treat common illnesses and complaints. They're usually fairly quick and inexpensive. However, if you have serious medical issues or chronic medical problems, these are probably not your best option   Use zofran for nausea. Take Elavil as prescribed.   Fibromyalgia Fibromyalgia is a disorder that is often misunderstood. It is associated with muscular pains and tenderness that comes and goes. It is  often associated with fatigue and sleep disturbances. Though it tends to be long-lasting, fibromyalgia is not life-threatening. CAUSES  The exact cause of fibromyalgia is unknown. People with certain gene types are predisposed to developing fibromyalgia and other conditions. Certain factors can play a role as triggers, such as:  Spine disorders.  Arthritis.  Severe injury (trauma) and other physical stressors.  Emotional stressors. SYMPTOMS   The main symptom is pain and stiffness in the muscles and joints, which can vary over time.  Sleep and fatigue problems. Other related symptoms may include:  Bowel and bladder problems.  Headaches.  Visual problems.  Problems with odors and noises.  Depression or mood changes.  Painful periods (dysmenorrhea).  Dryness of the skin or eyes. DIAGNOSIS  There are no specific tests for diagnosing fibromyalgia. Patients can be diagnosed accurately from the specific symptoms they have. The diagnosis is made by determining that nothing else is causing the problems. TREATMENT  There is no cure. Management includes medicines and an active, healthy lifestyle. The goal is to enhance physical fitness, decrease pain, and improve sleep. HOME CARE INSTRUCTIONS   Only take over-the-counter or prescription medicines as directed by your caregiver. Sleeping pills, tranquilizers, and pain medicines may make your problems worse.  Low-impact aerobic exercise is very important and advised for treatment. At first, it may seem to make pain worse. Gradually increasing your tolerance will overcome this feeling.  Learning relaxation techniques and how to control stress will help you. Biofeedback, visual imagery, hypnosis, muscle relaxation, yoga, and meditation are all options.  Anti-inflammatory medicines and physical therapy may provide short-term help.  Acupuncture or massage treatments may help.  Take muscle relaxant medicines as suggested by your  caregiver.  Avoid stressful situations.  Plan a healthy lifestyle. This includes your diet, sleep, rest, exercise, and friends.  Find and practice a hobby you enjoy.  Join a fibromyalgia support group for interaction, ideas, and sharing advice. This may be helpful. SEEK MEDICAL CARE IF:  You are not having good results or improvement from your treatment. FOR MORE INFORMATION  National Fibromyalgia Association: www.fmaware.org Arthritis Foundation: www.arthritis.org Document Released: 11/16/2005 Document Revised: 02/08/2012 Document Reviewed: 02/26/2010 [facility] Patient Information 2013 [place], [place].  Nausea and Vomiting Nausea is a sick feeling that often comes before throwing up (vomiting). Vomiting is a reflex where stomach contents come out of your mouth. Vomiting can cause severe loss of body fluids (dehydration). Children and elderly adults can become dehydrated quickly, especially if they also have diarrhea. Nausea and vomiting are symptoms of a condition or disease. It is important to find the cause of your symptoms. CAUSES   Direct irritation of the stomach lining. This irritation can result from increased acid production (gastroesophageal reflux  disease), infection, food poisoning, taking certain medicines (such as nonsteroidal anti-inflammatory drugs), alcohol use, or tobacco use.  Signals from the brain.These signals could be caused by a headache, heat exposure, an inner ear disturbance, increased pressure in the brain from injury, infection, a tumor, or a concussion, pain, emotional stimulus, or metabolic problems.  An obstruction in the gastrointestinal tract (bowel obstruction).  Illnesses such as diabetes, hepatitis, gallbladder problems, appendicitis, kidney problems, cancer, sepsis, atypical symptoms of a heart attack, or eating disorders.  Medical treatments such as chemotherapy and radiation.  Receiving medicine that makes you sleep (general anesthetic) during  surgery. DIAGNOSIS Your caregiver may ask for tests to be done if the problems do not improve after a few days. Tests may also be done if symptoms are severe or if the reason for the nausea and vomiting is not clear. Tests may include:  Urine tests.  Blood tests.  Stool tests.  Cultures (to look for evidence of infection).  X-rays or other imaging studies. Test results can help your caregiver make decisions about treatment or the need for additional tests. TREATMENT You need to stay well hydrated. Drink frequently but in small amounts.You may wish to drink water, sports drinks, clear broth, or eat frozen ice pops or gelatin dessert to help stay hydrated.When you eat, eating slowly may help prevent nausea.There are also some antinausea medicines that may help prevent nausea. HOME CARE INSTRUCTIONS   Take all medicine as directed by your caregiver.  If you do not have an appetite, do not force yourself to eat. However, you must continue to drink fluids.  If you have an appetite, eat a normal diet unless your caregiver tells you differently.  Eat a variety of complex carbohydrates (rice, wheat, potatoes, bread), lean meats, yogurt, fruits, and vegetables.  Avoid high-fat foods because they are more difficult to digest.  Drink enough water and fluids to keep your urine clear or pale yellow.  If you are dehydrated, ask your caregiver for specific rehydration instructions. Signs of dehydration may include:  Severe thirst.  Dry lips and mouth.  Dizziness.  Dark urine.  Decreasing urine frequency and amount.  Confusion.  Rapid breathing or pulse. SEEK IMMEDIATE MEDICAL CARE IF:   You have blood or brown flecks (like coffee grounds) in your vomit.  You have black or bloody stools.  You have a severe headache or stiff neck.  You are confused.  You have severe abdominal pain.  You have chest pain or trouble breathing.  You do not urinate at least once every 8  hours.  You develop cold or clammy skin.  You continue to vomit for longer than 24 to 48 hours.  You have a fever. MAKE SURE YOU:   Understand these instructions.  Will watch your condition.  Will get help right away if you are not doing well or get worse. Document Released: 11/16/2005 Document Revised: 02/08/2012 Document Reviewed: 04/15/2011 [facility] Patient Information 2013 [place], [place].

## 2013-04-18 NOTE — Progress Notes (Signed)
[facility] has seen patient and provided her with an OC application and PC resources.

## 2013-04-18 NOTE — ED Provider Notes (Signed)
History     CSN: [id number]  Arrival date & time 04/18/13  1038   First MD Initiated Contact with Patient 04/18/13 1057      Chief Complaint  Patient presents with  . Fatigue  . Fibromyalgia  . Nausea    (Consider location/radiation/quality/duration/timing/severity/associated sxs/prior treatment) HPI Comments: 52 y/o female with a PMHx of fibromyalgia, depression, anxiety, PTSD, alcohol abuse, chronic pain, opiate dependence and bipolar disorder presents to the ED complaining of "not feeling well" x 2 weeks. Patient states over the past two weeks she has been nauseous and fatigued, feels as if her fibromyalgia is "acting up". Her fibromyalgia pain is located around her shoulders, has been off of her amitriptyline for 1 month since health services would not give it to her, however still has her other medications for depression and bipolar. She has been taking 4 goody powders each morning without relief. Also complaining of polyuria, polydipsia and blurred vision. Positive family history of diabetes. Unable to eat more than "two tablespoons" of food at a time due to nausea. Gained 40 pounds since 07/2012. No vomiting, fever, chills, bowel changes, chest pain, sob. No PCP. Has not had a menstrual cycle for a few years. No vaginal bleeding.  The history is provided by the patient.    Past Medical History  Diagnosis Date  . Fibromyalgia   . Depression   . Anxiety   . PTSD (post-traumatic stress disorder)   . Alcohol abuse   . Arthritis   . Chronic pain   . Opiate dependence   . Bipolar disorder     Past Surgical History  Procedure Laterality Date  . Fibula fracture surgery      Family History  Problem Relation Age of Onset  . Diabetes Mother   . Hypertension Other     History  Substance Use Topics  . Smoking status: Current Every Day Smoker -- 1.00 packs/day    Types: Cigarettes  . Smokeless tobacco: Never Used  . Alcohol Use: Yes     Comment: no ETOH since May    OB  History   Grav Para Term Preterm Abortions TAB SAB Ect Mult Living                  Review of Systems  Constitutional: Positive for appetite change and unexpected weight change. Negative for fever and chills.  Eyes: Positive for visual disturbance.  Respiratory: Negative for cough and shortness of breath.   Cardiovascular: Negative for chest pain.  Gastrointestinal: Positive for nausea. Negative for vomiting and abdominal pain.  Endocrine: Positive for polydipsia and polyuria.  Genitourinary: Negative for dysuria, urgency, hematuria, vaginal bleeding and vaginal discharge.  Musculoskeletal: Positive for myalgias and arthralgias.  Neurological: Positive for dizziness. Negative for light-headedness.  Psychiatric/Behavioral: The patient is nervous/anxious.   All other systems reviewed and are negative.    Allergies  Demerol and Erythromycin  Home Medications   Current Outpatient Rx  Name  Route  Sig  Dispense  Refill  . ibuprofen (ADVIL,MOTRIN) 200 MG tablet   Oral   Take 200 mg by mouth every 6 (six) hours as needed. Pain           . lamoTRIgine (LAMICTAL) 100 MG tablet   Oral   Take 150 mg by mouth daily. Pt's on a taper dose, pt is currently on 50 mg once daily for 7 days. Pt will start lamictal 100 mg on 06-07-12. For mood         .  lithium carbonate 300 MG capsule   Oral   Take 300 mg by mouth 2 (two) times daily with a meal.         . lurasidone (LATUDA) 40 MG TABS   Oral   Take 20 mg by mouth daily with breakfast.         . traZODone (DESYREL) 100 MG tablet   Oral   Take 300 mg by mouth at bedtime.            There were no vitals taken for this visit.  Physical Exam  Nursing note and vitals reviewed. Constitutional: She is oriented to person, place, and time. She appears well-developed and well-nourished. No distress.  HENT:  Head: Normocephalic and atraumatic.  Mouth/Throat: Oropharynx is clear and moist.  Eyes: Conjunctivae and EOM are normal.  Pupils are equal, round, and reactive to light.  Neck: Normal range of motion. Neck supple. No thyromegaly present.    Cardiovascular: Normal rate, regular rhythm, normal heart sounds and intact distal pulses.   Pulmonary/Chest: Effort normal and breath sounds normal. No respiratory distress.  Abdominal: Soft. Bowel sounds are normal. She exhibits no distension and no mass. There is no tenderness.  Musculoskeletal: Normal range of motion. She exhibits no edema.  Neurological: She is alert and oriented to person, place, and time.  Skin: Skin is warm and dry. No pallor.  Psychiatric: Her speech is normal and behavior is normal. Her mood appears anxious.  Tearful    ED Course  Procedures (including critical care time)  Labs Reviewed  COMPREHENSIVE METABOLIC PANEL - Abnormal; Notable for the following:    Total Bilirubin 0.1 (*)    GFR calc non Af Amer 74 (*)    GFR calc Af Amer 86 (*)    All other components within normal limits  URINALYSIS, ROUTINE W REFLEX MICROSCOPIC - Abnormal; Notable for the following:    Hgb urine dipstick TRACE (*)    Leukocytes, UA SMALL (*)    All other components within normal limits  GLUCOSE, CAPILLARY - Abnormal; Notable for the following:    Glucose-Capillary 123 (*)    All other components within normal limits  URINE MICROSCOPIC-ADD ON - Abnormal; Notable for the following:    Squamous Epithelial / LPF FEW (*)    All other components within normal limits  CBC  LIPASE, BLOOD   No results found.   1. Fibromyalgia   2. Nausea       MDM  52 y/o female with fibromyalgia, nausea. Nausea subsided with zofran, pain subsided with ativan and toradol. She is no longer tearful. Labs normal. Glucose 92. Vitals stable. She will be discharged with amitriptyline, zofran. Resource guide given for PCP follow up. Patient states understanding of plan and is agreeable.          Trevor Mace, PA-C 04/18/13 1407

## 2013-04-18 NOTE — ED Notes (Addendum)
Pt states that she has been fatigued with nausea.  No vomiting.  States her "fibromyalgia is acting up".  States that this has been going on for about a week.  States that she started taking diet pills around the same time that this started happening.  Stopped the diet pills a few days ago but still feels bad.

## 2013-04-18 NOTE — ED Provider Notes (Signed)
Medical screening examination/treatment/procedure(s) were performed by non-physician practitioner and as supervising physician I was immediately available for consultation/collaboration.  Raeford Razor, MD 04/18/13 [phone]

## 2013-04-18 NOTE — ED Notes (Addendum)
Pt did well with PO challenge: Robyn, PA notified.

## 2014-05-25 ENCOUNTER — Other Ambulatory Visit (HOSPITAL_COMMUNITY): Payer: Self-pay | Admitting: *Deleted

## 2014-05-25 DIAGNOSIS — N6320 Unspecified lump in the left breast, unspecified quadrant: Secondary | ICD-10-CM

## 2014-05-29 ENCOUNTER — Inpatient Hospital Stay (HOSPITAL_COMMUNITY): Admission: RE | Admit: 2014-05-29 | Payer: Self-pay | Source: Ambulatory Visit

## 2014-06-05 ENCOUNTER — Other Ambulatory Visit: Payer: Self-pay

## 2015-01-24 ENCOUNTER — Emergency Department (HOSPITAL_COMMUNITY): Payer: Medicaid Other

## 2015-01-24 ENCOUNTER — Encounter (HOSPITAL_COMMUNITY): Payer: Self-pay | Admitting: Emergency Medicine

## 2015-01-24 ENCOUNTER — Emergency Department (HOSPITAL_COMMUNITY)
Admission: EM | Admit: 2015-01-24 | Discharge: 2015-01-24 | Disposition: A | Discharge status: Home / Self Care | Payer: Medicaid Other | Attending: Emergency Medicine | Admitting: Emergency Medicine

## 2015-01-24 DIAGNOSIS — F419 Anxiety disorder, unspecified: Secondary | ICD-10-CM | POA: Insufficient documentation

## 2015-01-24 DIAGNOSIS — G8929 Other chronic pain: Secondary | ICD-10-CM | POA: Insufficient documentation

## 2015-01-24 DIAGNOSIS — J209 Acute bronchitis, unspecified: Secondary | ICD-10-CM | POA: Insufficient documentation

## 2015-01-24 DIAGNOSIS — R079 Chest pain, unspecified: Secondary | ICD-10-CM

## 2015-01-24 DIAGNOSIS — F4310 Post-traumatic stress disorder, unspecified: Secondary | ICD-10-CM | POA: Insufficient documentation

## 2015-01-24 DIAGNOSIS — M797 Fibromyalgia: Secondary | ICD-10-CM | POA: Insufficient documentation

## 2015-01-24 DIAGNOSIS — M1990 Unspecified osteoarthritis, unspecified site: Secondary | ICD-10-CM | POA: Insufficient documentation

## 2015-01-24 DIAGNOSIS — Z3202 Encounter for pregnancy test, result negative: Secondary | ICD-10-CM | POA: Insufficient documentation

## 2015-01-24 DIAGNOSIS — F17200 Nicotine dependence, unspecified, uncomplicated: Secondary | ICD-10-CM

## 2015-01-24 DIAGNOSIS — R7989 Other specified abnormal findings of blood chemistry: Secondary | ICD-10-CM | POA: Insufficient documentation

## 2015-01-24 DIAGNOSIS — Z79899 Other long term (current) drug therapy: Secondary | ICD-10-CM | POA: Insufficient documentation

## 2015-01-24 DIAGNOSIS — Z720 Tobacco use: Secondary | ICD-10-CM | POA: Insufficient documentation

## 2015-01-24 DIAGNOSIS — R0789 Other chest pain: Secondary | ICD-10-CM | POA: Insufficient documentation

## 2015-01-24 DIAGNOSIS — F319 Bipolar disorder, unspecified: Secondary | ICD-10-CM | POA: Insufficient documentation

## 2015-01-24 LAB — CBC
HCT: 40.6 % (ref 36.0–46.0)
Hemoglobin: 13.1 g/dL (ref 12.0–15.0)
MCH: 31 pg (ref 26.0–34.0)
MCHC: 32.3 g/dL (ref 30.0–36.0)
MCV: 96 fL (ref 78.0–100.0)
Platelets: 220 10*3/uL (ref 150–400)
RBC: 4.23 MIL/uL (ref 3.87–5.11)
RDW: 13 % (ref 11.5–15.5)
WBC: 6.5 10*3/uL (ref 4.0–10.5)

## 2015-01-24 LAB — BASIC METABOLIC PANEL
Anion gap: 7 (ref 5–15)
BUN: 12 mg/dL (ref 6–23)
CO2: 25 mmol/L (ref 19–32)
Calcium: 9.5 mg/dL (ref 8.4–10.5)
Chloride: 108 mmol/L (ref 96–112)
Creatinine, Ser: 1.15 mg/dL — ABNORMAL HIGH (ref 0.50–1.10)
GFR calc Af Amer: 62 mL/min — ABNORMAL LOW (ref >90)
GFR calc non Af Amer: 53 mL/min — ABNORMAL LOW (ref >90)
Glucose, Bld: 114 mg/dL — ABNORMAL HIGH (ref 70–99)
Potassium: 3.9 mmol/L (ref 3.5–5.1)
Sodium: 140 mmol/L (ref 135–145)

## 2015-01-24 LAB — I-STAT TROPONIN, ED: Troponin i, poc: 0 ng/mL (ref 0.00–0.08)

## 2015-01-24 LAB — BRAIN NATRIURETIC PEPTIDE: B Natriuretic Peptide: 12.1 pg/mL (ref 0.0–100.0)

## 2015-01-24 LAB — I-STAT BETA HCG BLOOD, ED (MC, WL, AP ONLY): I-stat hCG, quantitative: <5 m[IU]/mL (ref <5)

## 2015-01-24 MED ORDER — SODIUM CHLORIDE 0.9 % IV BOLUS (SEPSIS)
1000.0000 mL | Freq: Once | INTRAVENOUS | Status: AC
  Administered 2015-01-24: 1000 mL via INTRAVENOUS

## 2015-01-24 MED ORDER — NICOTINE 21 MG/24HR TD PT24
21.0000 mg | MEDICATED_PATCH | Freq: Once | TRANSDERMAL | Status: DC
  Administered 2015-01-24: 21 mg via TRANSDERMAL
  Filled 2015-01-24: qty 1

## 2015-01-24 MED ORDER — ASPIRIN 81 MG PO CHEW
324.0000 mg | CHEWABLE_TABLET | Freq: Once | ORAL | Status: AC
  Administered 2015-01-24: 324 mg via ORAL
  Filled 2015-01-24: qty 4

## 2015-01-24 MED ORDER — ALPRAZOLAM 0.5 MG PO TABS: 0.5000 mg | ORAL_TABLET | Freq: Two times a day (BID) | ORAL | Status: DC | PRN

## 2015-01-24 MED ORDER — ALBUTEROL SULFATE HFA 108 (90 BASE) MCG/ACT IN AERS
2.0000 | INHALATION_SPRAY | Freq: Once | RESPIRATORY_TRACT | Status: AC
Start: 2015-01-24 — End: 2015-01-24
  Administered 2015-01-24: 2 via RESPIRATORY_TRACT
  Filled 2015-01-24 (×2): qty 6.7

## 2015-01-24 MED ORDER — LORAZEPAM 1 MG PO TABS
1.0000 mg | ORAL_TABLET | Freq: Once | ORAL | Status: AC
  Administered 2015-01-24: 1 mg via ORAL
  Filled 2015-01-24: qty 1

## 2015-01-24 MED ORDER — AZITHROMYCIN 250 MG PO TABS: ORAL_TABLET | ORAL | Status: DC

## 2015-01-24 MED ORDER — PREDNISONE 50 MG PO TABS: ORAL_TABLET | ORAL | Status: DC

## 2015-01-24 MED ORDER — NICOTINE 21 MG/24HR TD PT24: 21.0000 mg | MEDICATED_PATCH | Freq: Every day | TRANSDERMAL | Status: DC

## 2015-01-24 NOTE — Discharge Instructions (Signed)
Your kidney function tests were slightly abnormal today, push fluids and recheck in 7 days. If you have any issues getting into see her primary care physician you can go to the [facility] urgent care center.  Do not hesitate to return to the emergency room for any new, worsening or concerning symptoms.  Please obtain primary care using resource guide below. But the minute you were seen in the emergency room and that they will need to obtain records for further outpatient management.     Emergency Department Resource Guide 1) Find a Doctor and Pay Out of Pocket Although you won't have to find out who is covered by your insurance plan, it is a good idea to ask around and get recommendations. You will then need to call the office and see if the doctor you have chosen will accept you as a new patient and what types of options they offer for patients who are self-pay. Some doctors offer discounts or will set up payment plans for their patients who do not have insurance, but you will need to ask so you aren't surprised when you get to your appointment.  2) Contact Your Local Health Department Not all health departments have doctors that can see patients for sick visits, but many do, so it is worth a call to see if yours does. If you don't know where your local health department is, you can check in your phone book. The CDC also has a tool to help you locate your state's health department, and many state websites also have listings of all of their local health departments.  3) Find a [facility] If your illness is not likely to be very severe or complicated, you may want to try a walk in clinic. These are popping up all over the country in pharmacies, drugstores, and shopping centers. They're usually staffed by nurse practitioners or physician assistants that have been trained to treat common illnesses and complaints. They're usually fairly quick and inexpensive. However, if you have serious medical  issues or chronic medical problems, these are probably not your best option.  No Primary Care Doctor: - Call Health Connect at  [phone] - they can help you locate a primary care doctor that  accepts your insurance, provides certain services, etc. - Physician Referral Service- [phone]  Chronic Pain Problems: Organization         Address  Phone   Notes  [facility]  [phone] Patients need to be referred by their primary care doctor.   Medication Assistance: Organization         Address  Phone   Notes  [facility] Medication [facility] [address] [place], [place] [address] [phone] --Must be a resident of [facility] -- Must have NO insurance coverage whatsoever (no Medicaid/ Medicare, etc.) -- The pt. MUST have a primary care doctor that directs their care regularly and follows them in the community   Med[facility]ist  [phone]   [employer]  [phone]    Agencies that provide inexpensive medical care: Organization         Address  Phone   Notes  [place]  [phone]   [facility]    [phone]   [facility] [place], [place] [address] [phone]   [place] [address] [place] [phone]   Planned Parenthood    [phone])  [facility]    [phone]   [address], [place] Phone:  [phone], Fax:  [phone] Hours of Operation:  9 am - 6 pm, M-F.  Also accepts Medicaid/Medicare and self-pay.  [facility] for [place], [address], [place] Phone: [phone], Fax: [phone]. Hours of Operation:  8:30 am - 5:30 pm, M-F.  Also accepts Medicaid and self-pay.  [facility] [place] [address] [place] Phone: [phone]   [place], [place], [place]  [phone], Ext. [phone] Mondays & Thursdays: 7-9 AM.  First 15 patients are seen on a first come, first serve basis.    [place] Providers:  Organization         Address  Phone   Notes  [facility] [address], [place] [phone] Also accepts self-pay patients.  [facility] [address],   [phone]   [place], [address], [place] [phone]   [facility] [address] [place] [phone]   Lucianne Lei [address], [place]   [phone] Only accepts [place] Access [place] patients after they have their name applied to their card.   Self-Pay (no insurance) in [facility]:  Organization         Address  Phone   Notes  Sickle Cell Patients, [facility] Internal [facility] [place] [phone]   [facility]gent Care [place] [phone]   [facility] [place]  [place], [place], [place] [phone]   [facility]/Dr. Osei-Bonsu  [address], [employer] or [address], [place] [phone] Phone number for both [place] and [place] locations is the same.  Urgent Medical and [facility] [address] [place] [phone]   [facility] [address], [place] or [address] [phone]   [facility] [address], [place] [phone], phone; [phone], fax Sees patients 1st and 3rd Saturday of every month.  Must not qualify for public or private insurance (i.e. Medicaid, Medicare, [place] Health Choice, Veterans' Benefits)  Household income should be no more than 200% of the poverty level The clinic cannot treat you if you are pregnant or think you are pregnant  Sexually transmitted  diseases are not treated at the clinic.    Dental Care: Organization         Address  Phone  Notes  [facility] Department of [facility] [place] [phone] Accepts children up to age 51 who are enrolled in [place] or [place]; pregnant women with a Medicaid card; and children who have applied for Medicaid or [place] Health Choice, but were declined, whose parents can pay a reduced fee at time of service.  [facility] of [facility]  [address], [place] [phone] Accepts children up to age 69 who are enrolled in [place] or [place]; pregnant women with a Medicaid card; and children who have applied for Medicaid or [place], but were declined, whose parents can pay a reduced  fee at time of service.  [place] Adult Dental Access PROGRAM  [place] [phone] Patients are seen by appointment only. Walk-ins are not accepted. [place] will see patients 6 years of age and older. Monday - Tuesday (8am-5pm) Most Wednesdays (8:30-5pm) $30 per visit, cash only  [facility] Adult Dental Access PROGRAM  [address], [facility] [phone] Patients are seen by appointment only. Walk-ins are not accepted. [place] will see patients 65 years of age and older. One Wednesday Evening (Monthly: Volunteer Based).  $30 per visit, cash only  [place]  [phone] for adults; Children under age 24, call Graduate Pediatric Dentistry at [phone]. Children aged 79-14, please call [phone] to request a pediatric application.  Dental services are provided in all areas of dental care including fillings, crowns and bridges, complete and partial dentures, implants, gum treatment, root canals, and extractions. Preventive care is also provided. Treatment is provided to both adults and children. Patients are selected via a  lottery and there is often a waiting list.   [facility] [address] [place]  [phone] [email or web address]   [facility] [address] [place], [place] [phone], Ext. [phone] Second and Fourth Thursday of each month, opens at 6:30 AM; Clinic ends at 9 AM.  Patients are seen on a first-come first-served basis, and a limited number are seen during each clinic.   [facility]  [address] Hillard Danker [place], [place] [phone]   Eligibility Requirements You must have lived in [place], [place], or [place] counties for at least the last three months.   You cannot be eligible for state or federal sponsored [employer], including [employer], [place], or [employer].   You generally cannot be eligible for healthcare insurance through your employer.    How to apply: Eligibility screenings are held every Tuesday and Wednesday afternoon from 1:00 pm until 4:00 pm. You do not need an appointment for the interview!  [facility] [address], [place], [place]   [place]  [place] Department  [place]  [phone]    Behavioral Health Resources in the Community: Intensive Outpatient Programs Organization         Address  Phone  Notes  [facility] [address] [place], [place] [phone]   [facility] Outpatient [address], [place], [place]   ADS: Alcohol & Drug Svcs [address] [place], [place]   [place] [address],  [place], [place] or [phone]   Substance Abuse Resources Organization         Address  Phone  Notes  Alcohol and Drug Services  [phone]   [place]  [phone]   [facility] [place]   Chinita Pester  [phone]   Residential &  Outpatient Substance Abuse Program  [phone]   Psychological Services Organization         Address  Phone  Notes  [facility] [place]  [place]  [phone]   [place] [address], [place] or [phone]    Mobile Crisis Teams Organization         Address  Phone  Notes  [facility], Mobile Crisis Care Unit  [phone]  [facility]ive Psychotherapeutic Services  [address] [place], [place]   [facility] [address] [place] [phone]    Self-Help/Support Groups Organization         Address  Phone             Notes  Mental Health Assoc. of [place] - variety of support groups  [place] Call for more information  Narcotics Anonymous (NA), [facility] [address], [employer] [place]  2 meetings at this location   Special educational needs teacher         Address  Phone  Notes  [facility] [place],    [place]  [phone]   [facility]  [address], [place] [phone], [place], [place]   [place], [place] [phone] Admissions: 8am-3pm M-F  Incentives Substance [place] [address],    [place], [place] [phone]   [facility] [address] [place], [place], [place]   [facility] [address],  [place], [place]   [facility] - Intensive Outpatient [address], [place], [place]   [facility]  [place], [place] [phone] or [phone]   [facility] Services (RTS) [address] [place], [place] Accepts Medicaid  [facility] [place] [address],  [place] [phone] Substance Abuse/Addiction Treatment   [facility] Organization          Address  Phone  Notes  [facility]  [phone]   Domenic Schwab, PhD [address] Arlis Porta [place], [place]   [phone] or [phone]   [place], [place] [phone]   [facility] [address], [place], [place] [phone] Insurance/Medicaid/sponsorship through [facility] and Families [address] [place]                                    [place], [place] [phone] [place] [address], [place] [phone]    Dr. Adele Schilder  [phone]   [facility] Dept. 1) [address] [place] 2) [place] 3)  [place] [address], [place] [phone]  [phone]   [place] [phone] or [phone] (After Hours)        Acute Bronchitis Bronchitis is inflammation of the airways that extend from the windpipe into the lungs (bronchi). The inflammation often causes mucus to develop. This leads to a cough, which is the most common symptom of bronchitis.  In acute bronchitis, the condition usually develops suddenly and goes away over time, usually in a couple weeks. Smoking, allergies, and asthma can make bronchitis worse. Repeated episodes of bronchitis may cause further lung problems.  CAUSES Acute bronchitis is most often caused by the same virus that causes a cold. The virus can spread from person to person (contagious) through coughing, sneezing, and touching contaminated objects. SIGNS AND SYMPTOMS   Cough.   Fever.   Coughing up mucus.   Body aches.   Chest congestion.   Chills.   Shortness of breath.   Sore throat.  DIAGNOSIS  Acute bronchitis is usually diagnosed through a physical exam. Your health care provider will also ask you questions about your medical history. Tests, such as chest X-rays, are sometimes done  to rule out other conditions.  TREATMENT  Acute bronchitis usually goes away in a couple weeks. Oftentimes, no medical treatment is necessary. Medicines are sometimes given for relief of fever or cough. Antibiotic medicines are usually not needed but may be prescribed in certain situations. In some cases, an inhaler may be recommended to help reduce shortness of breath and control the cough. A cool mist vaporizer may also be used to help thin bronchial secretions and make it easier to clear the chest.  HOME CARE INSTRUCTIONS  Get plenty of rest.   Drink enough fluids to keep your urine clear or pale yellow (unless you have a medical condition that requires fluid restriction). Increasing fluids may help thin your respiratory secretions (sputum) and reduce chest congestion, and it will prevent dehydration.   Take medicines only as directed by your health care provider.  If you were prescribed an antibiotic medicine, finish it all even if you start to feel better.  Avoid smoking and secondhand smoke. Exposure to cigarette smoke or irritating chemicals will make bronchitis worse. If you are a smoker, consider using nicotine gum or skin patches to help control withdrawal symptoms. Quitting smoking will help your lungs heal faster.   Reduce the chances of another bout of acute bronchitis by washing your hands frequently, avoiding people with cold symptoms, and trying not to touch your hands to your mouth, nose, or eyes.   Keep all follow-up visits as directed by your health care provider.  SEEK MEDICAL CARE IF: Your symptoms do not improve after 1 week of treatment.  SEEK IMMEDIATE MEDICAL CARE IF:  You develop an increased fever or chills.   You have chest pain.   You have severe shortness of breath.  You have bloody sputum.   You develop dehydration.  You faint or repeatedly feel like you are going to pass out.  You develop repeated vomiting.  You develop a severe  headache. MAKE SURE YOU:   Understand these instructions.  Will watch your condition.  Will get help right away if you are not doing well or get worse. Document Released: 12/24/2004 Document Revised: 04/02/2014 Document Reviewed: 05/09/2013 [facility] Patient Information 2015 [place], [place]. This information is not intended to replace advice given to you by your health care provider. Make sure you discuss any questions you have with your health care provider.

## 2015-01-24 NOTE — ED Notes (Signed)
Patient states chest pain and sob x 2 weeks.   Patient states she has intermittent central chest pain that is aching in nature.   Patient states she has multiple other symptoms.

## 2015-01-24 NOTE — ED Provider Notes (Signed)
CSN: [id number]     Arrival date & time 01/24/15  2585 History   First MD Initiated Contact with Patient 01/24/15 [phone]     Chief Complaint  Patient presents with  . Chest Pain  . Shortness of Breath     (Consider location/radiation/quality/duration/timing/severity/associated sxs/prior Treatment) HPI   [name] is a 54 y.o. female complaining of left-sided chest pain radiating to the left axilla intermittently for several weeks becoming constant over the last 48 hours described as aching and associated with fatigue in general shortness of breath. Patient denies fever, chills, change in sputum, abdominal pain, diaphoresis, exertional exacerbation of pain, pleuritic exacerbation of change, peripheral edema, calf pain leg swelling, history of DVT or PE. States that she just feels extremely stressed (states that she has issues with her son and her sister being suicidal, her son is moving away, she is breaking up with her boyfriend and she is on the verge of being evicted). She can follow with her primary care doctor because she is uninsured, she is taking her bipolar and depression medications, she has an appointment with her psychiatrist later this week. She denies suicidal ideation, homicidal ideation, auditory or visual hallucinations, drug or alcohol abuse (note that she states that she has been clean for 2 years), family history of MI, diabetes, hypertension, hyperlipidemia. She smokes 1 pack per day.  Past Medical History  Diagnosis Date  . Fibromyalgia   . Depression   . Anxiety   . PTSD (post-traumatic stress disorder)   . Alcohol abuse   . Arthritis   . Chronic pain   . Opiate dependence   . Bipolar disorder    Past Surgical History  Procedure Laterality Date  . Fibula fracture surgery     Family History  Problem Relation Age of Onset  . Diabetes Mother   . Hypertension Other    History  Substance Use Topics  . Smoking status: Current Every Day Smoker -- 1.00 packs/day     Types: Cigarettes  . Smokeless tobacco: Never Used  . Alcohol Use: Yes     Comment: no ETOH since May   OB History    No data available     Review of Systems  10 systems reviewed and found to be negative, except as noted in the HPI.   Allergies  Demerol and Erythromycin  Home Medications   Prior to Admission medications   Medication Sig Start Date End Date Taking? Authorizing Provider  amitriptyline (ELAVIL) 150 MG tablet Take 1 tablet (150 mg total) by mouth at bedtime. 04/18/13   Robyn M Hess, PA-C  ibuprofen (ADVIL,MOTRIN) 200 MG tablet Take 200 mg by mouth every 6 (six) hours as needed. Pain      Historical Provider, MD  lamoTRIgine (LAMICTAL) 100 MG tablet Take 150 mg by mouth daily. Pt's on a taper dose, pt is currently on 50 mg once daily for 7 days. Pt will start lamictal 100 mg on 06-07-12. For mood    Historical Provider, MD  lithium carbonate 300 MG capsule Take 300 mg by mouth 2 (two) times daily with a meal.    Historical Provider, MD  lurasidone (LATUDA) 40 MG TABS Take 20 mg by mouth daily with breakfast.    Historical Provider, MD  ondansetron (ZOFRAN) 4 MG tablet Take 1 tablet (4 mg total) by mouth every 6 (six) hours. 04/18/13   Robyn M Hess, PA-C  traZODone (DESYREL) 100 MG tablet Take 300 mg by mouth at bedtime.  Historical Provider, MD   BP 100/73 mmHg  Temp(Src) 98 F (36.7 C) (Oral)  Resp 18  Ht 5\' 5"  (1.651 m)  Wt 160 lb (72.576 kg)  BMI 26.63 kg/m2  SpO2 99% Physical Exam  Constitutional: She is oriented to person, place, and time. She appears well-developed and well-nourished. No distress.  HENT:  Head: Normocephalic and atraumatic.  Mouth/Throat: Oropharynx is clear and moist.  Eyes: Conjunctivae and EOM are normal. Pupils are equal, round, and reactive to light.  Neck: Normal range of motion.  Cardiovascular: Normal rate, regular rhythm and intact distal pulses.   Pulmonary/Chest: Effort normal and breath sounds normal. No stridor. No  respiratory distress. She has no wheezes. She has no rales. She exhibits no tenderness.  Abdominal: Soft. Bowel sounds are normal. She exhibits no distension and no mass. There is no tenderness. There is no rebound and no guarding.  Musculoskeletal: Normal range of motion. She exhibits no edema or tenderness.  No calf asymmetry, superficial collaterals, palpable cords, edema, Homans sign negative bilaterally.    Neurological: She is alert and oriented to person, place, and time.  Psychiatric: She has a normal mood and affect.  Nursing note and vitals reviewed.   ED Course  Procedures (including critical care time) Labs Review Labs Reviewed - No data to display  Imaging Review No results found.   EKG Interpretation None      MDM   Final diagnoses:  Elevated serum creatinine  Tobacco use disorder  Atypical chest pain  Acute bronchitis, unspecified organism   Filed Vitals:   01/24/15 0830 01/24/15 0845 01/24/15 0900 01/24/15 0930  BP: 124/75 103/86 105/76 105/68  Pulse: 97 82  78  Temp:      TempSrc:      Resp: 16 13    Height:      Weight:      SpO2: 98% 96%  96%    Medications  sodium chloride 0.9 % bolus 1,000 mL (not administered)  aspirin chewable tablet 324 mg (324 mg Oral Given 01/24/15 0852)  LORazepam (ATIVAN) tablet 1 mg (1 mg Oral Given 01/24/15 3810)    [name] is a pleasant 54 y.o. female presenting with atypical chest pain. Patient is low risk by heart score, EKG is nonischemic with no arrhythmia, troponin is negative. Physical exam is not consistent with DVT and symptoms are not consistent with PE. Lung sounds are clear to auscultation bilaterally. No change in sputum.  Patient is reasonably stressed at this hard time in her life. I will write her a short prescription for Xanax, she will follow with her primary care physician, there is no indication for emergent psychiatric intervention at this time.  Chest x-ray shows a possible early infiltrate,  will treat with a Z-Pak. Encouraged patient to recheck her creatinine in the next week.   Evaluation does not show pathology that would require ongoing emergent intervention or inpatient treatment. Pt is hemodynamically stable and mentating appropriately. Discussed findings and plan with patient/guardian, who agrees with care plan. All questions answered. Return precautions discussed and outpatient follow up given.   New Prescriptions   ALPRAZOLAM (XANAX) 0.5 MG TABLET    Take 1 tablet (0.5 mg total) by mouth 2 (two) times daily as needed for anxiety.   AZITHROMYCIN (ZITHROMAX Z-PAK) 250 MG TABLET    2 po day one, then 1 daily x 4 days   PREDNISONE (DELTASONE) 50 MG TABLET    Take 1 tablet daily with breakfast  Monico Blitz, PA-C 01/24/15 1137  Dot Lanes, MD 01/24/15 [phone]

## 2015-02-28 ENCOUNTER — Ambulatory Visit: Payer: Self-pay | Attending: Family Medicine | Admitting: Family Medicine

## 2015-02-28 ENCOUNTER — Encounter: Payer: Self-pay | Admitting: Family Medicine

## 2015-02-28 VITALS — BP 143/83 | HR 80 | Temp 98.0°F | Resp 18 | Ht 65.0 in | Wt 155.0 lb

## 2015-02-28 DIAGNOSIS — G894 Chronic pain syndrome: Secondary | ICD-10-CM

## 2015-02-28 DIAGNOSIS — R7989 Other specified abnormal findings of blood chemistry: Secondary | ICD-10-CM | POA: Insufficient documentation

## 2015-02-28 DIAGNOSIS — R748 Abnormal levels of other serum enzymes: Secondary | ICD-10-CM

## 2015-02-28 DIAGNOSIS — Z114 Encounter for screening for human immunodeficiency virus [HIV]: Secondary | ICD-10-CM | POA: Insufficient documentation

## 2015-02-28 DIAGNOSIS — M797 Fibromyalgia: Secondary | ICD-10-CM | POA: Insufficient documentation

## 2015-02-28 DIAGNOSIS — Z833 Family history of diabetes mellitus: Secondary | ICD-10-CM | POA: Insufficient documentation

## 2015-02-28 DIAGNOSIS — F1011 Alcohol abuse, in remission: Secondary | ICD-10-CM

## 2015-02-28 DIAGNOSIS — F411 Generalized anxiety disorder: Secondary | ICD-10-CM

## 2015-02-28 DIAGNOSIS — R110 Nausea: Secondary | ICD-10-CM | POA: Insufficient documentation

## 2015-02-28 DIAGNOSIS — F1010 Alcohol abuse, uncomplicated: Secondary | ICD-10-CM

## 2015-02-28 LAB — BASIC METABOLIC PANEL
BUN: 5 mg/dL — ABNORMAL LOW (ref 6–23)
CO2: 28 mEq/L (ref 19–32)
Calcium: 10 mg/dL (ref 8.4–10.5)
Chloride: 106 mEq/L (ref 96–112)
Creat: 0.89 mg/dL (ref 0.50–1.10)
Glucose, Bld: 85 mg/dL (ref 70–99)
Potassium: 4.9 mEq/L (ref 3.5–5.3)
Sodium: 144 mEq/L (ref 135–145)

## 2015-02-28 LAB — POCT URINALYSIS DIPSTICK
Bilirubin, UA: NEGATIVE
Glucose, UA: NEGATIVE
Ketones, UA: NEGATIVE
Nitrite, UA: NEGATIVE
Protein, UA: NEGATIVE
Spec Grav, UA: <=1.005
Urobilinogen, UA: 0.2
pH, UA: 6.5

## 2015-02-28 LAB — GLUCOSE, POCT (MANUAL RESULT ENTRY): POC Glucose: 106 mg/dl — AB (ref 70–99)

## 2015-02-28 LAB — POCT GLYCOSYLATED HEMOGLOBIN (HGB A1C): Hemoglobin A1C: 5.6

## 2015-02-28 LAB — HIV ANTIBODY (ROUTINE TESTING W REFLEX): HIV 1&2 Ab, 4th Generation: NONREACTIVE

## 2015-02-28 MED ORDER — PANTOPRAZOLE SODIUM 40 MG PO TBEC: 40.0000 mg | DELAYED_RELEASE_TABLET | Freq: Every day | ORAL | Status: DC

## 2015-02-28 MED ORDER — GABAPENTIN 300 MG PO CAPS: 300.0000 mg | ORAL_CAPSULE | Freq: Three times a day (TID) | ORAL | Status: DC

## 2015-02-28 MED ORDER — PREGABALIN 75 MG PO CAPS: 75.0000 mg | ORAL_CAPSULE | Freq: Two times a day (BID) | ORAL | Status: DC

## 2015-02-28 MED ORDER — ONDANSETRON HCL 8 MG PO TABS: 8.0000 mg | ORAL_TABLET | Freq: Three times a day (TID) | ORAL | Status: DC | PRN

## 2015-02-28 NOTE — Patient Instructions (Addendum)
[name],  Thank you for coming in today. It was a pleasure meeting you. I look forward to being your primary doctor.   1. Chronic pain/fibromyalgia:  Start gabapentin as written  Apply for PASS (medication assistance) for lyrica which would replace gabapentin I will discuss tramadol as an option with your psychiatrist  Stop ibuprofen and goody powders given risk of gastritis and kidney disease  Vit D    2. Diabetes screen: A1c and CBG today  3. Nausea: will treat with zofran.  Also start protonix 40 mg once daily 30 minutes before supper   4. Health screening: F/u for pap smear Ordered mammogram Screening colonoscopy  F/u in 4-6 weeks for pap smear  Dr. Adrian Blackwater

## 2015-02-28 NOTE — Progress Notes (Signed)
Establish Care ED Bronchitis, elevated creatine Complaining of nausea and pain

## 2015-03-01 ENCOUNTER — Telehealth: Payer: Self-pay | Admitting: Family Medicine

## 2015-03-01 LAB — VITAMIN D 25 HYDROXY (VIT D DEFICIENCY, FRACTURES): Vit D, 25-Hydroxy: 35 ng/mL (ref 30–100)

## 2015-03-01 NOTE — Progress Notes (Signed)
   Subjective:    Patient ID: [name], female    DOB: 11/24/1961, 54 y.o.   MRN: [id number] CC: fibromyalgia and nausea  HPI  1. Chronic pain: dx with fibromyalgia. Has been treated with gabapentin in the past cannot recall if it helped. Also reports treatment with SNRI in the past. Has daily pain. Pain in neck, L shoulder, hips. Smoker.   2. Anxiety: sees psychiatry at [facility], [place] [phone]). Taking lamictal, lithium and trazodone. Still has significant anxiety. Mostly worried about her health. Her son is graduating from flight attendant school in 2 weeks and she is worried about making it there.  3. Nausea with abdominal pains: x 8 weeks. Bloating loose stools. No fever. Has hx of constipation x 1-1.5 yrs prior. Has hx of taking multiple Goody powders for chronic pain.   Soc hx: current smoker 7/day, down from 1 PPD x 30 yrs  Med hx: ETOH abuse in remission x 2.5 years  Fam hx: DM2 in mother   Review of Systems  Constitutional: Negative for fever and unexpected weight change.  Respiratory: Negative for shortness of breath.   Cardiovascular: Negative for chest pain.  Gastrointestinal: Positive for nausea. Negative for vomiting, abdominal pain and constipation.  Musculoskeletal: Positive for myalgias and arthralgias. Negative for joint swelling.  Psychiatric/Behavioral: Positive for dysphoric mood. The patient is nervous/anxious.        Objective:   Physical Exam BP 143/83 mmHg  Pulse 80  Temp(Src) 98 F (36.7 C) (Oral)  Resp 18  Ht 5\' 5"  (1.651 m)  Wt 155 lb (70.308 kg)  BMI 25.79 kg/m2  SpO2 98% General appearance: alert, cooperative and no distress Lungs: clear to auscultation bilaterally Heart: regular rate and rhythm, S1, S2 normal, no murmur, click, rub or gallop Abdomen: soft, non-tender; bowel sounds normal; no masses,  no organomegaly Extremities: extremities normal, atraumatic, no cyanosis or edema     Assessment & Plan:

## 2015-03-01 NOTE — Telephone Encounter (Signed)
Called patient's psychiatrist at [facility], left VM requesting call back.  Patient has given verbal permission for me to discuss her care with her psychiatrist Debbora Dus.

## 2015-03-01 NOTE — Assessment & Plan Note (Addendum)
A: nausea with epigastric pain, in setting of taking lots of Goody powders. Suspect GERD.  P: will treat with zofran.  Also start protonix 40 mg once daily 30 minutes before supper

## 2015-03-01 NOTE — Assessment & Plan Note (Addendum)
Screening HIV ordered  Screening HIV negative  

## 2015-03-01 NOTE — Assessment & Plan Note (Signed)
Normal Cr on recheck

## 2015-03-04 ENCOUNTER — Telehealth: Payer: Self-pay | Admitting: *Deleted

## 2015-03-04 DIAGNOSIS — K5900 Constipation, unspecified: Secondary | ICD-10-CM

## 2015-03-04 MED ORDER — POLYETHYLENE GLYCOL 3350 17 GM/SCOOP PO POWD
17.0000 g | Freq: Two times a day (BID) | ORAL | Status: DC | PRN
Start: 2015-03-04 — End: 2015-03-28

## 2015-03-04 NOTE — Telephone Encounter (Signed)
Patient called requesting prescription for Miralax for her constipation.  Per Dr. Adrian Blackwater sent prescription to our pharmacy for filling today. Patient also Inquiring about her lab work.  Per Dr. Jamesetta So are normal and HIV negative.

## 2015-03-05 NOTE — Telephone Encounter (Signed)
Patient called back again today stating that the Tylenol is giving her no relief from there neck and shoulder pain.  She is wondering if she can be given something else or if the Neurontin could be increased.  She was also wondering if Dr. Adrian Blackwater has been in touch with her Psychiatrist regarding medication recommendations.  I read her Dr. Adrian Blackwater note and she reccommended that we call them back b/c they may take up to 3 weeks to call back.  Explained that we would route this note to Dr. Adrian Blackwater.

## 2015-03-05 NOTE — Telephone Encounter (Signed)
Stella please call patient,  I called [facility] today.  I was informed that [name] is in on Friday only. [name] is in on Monday only. I left a message informing Dr. Audie Box of the med change, added gabapentin and dose  I also requested a call back to discuss treatment options for patient's chronic pain symptoms.   For now,  She may increase gabapentin to 600 mg three times a day but this has to be a slow taper up like when starting the gabapentin.  600 mg at night for 3 days, then 600 mg BID with 300 mg once daily for 3 days, then 600 mg TID.  If 600 mg TID does not yield significant improvement over the next month then lyrica.

## 2015-03-06 NOTE — Telephone Encounter (Signed)
Pt.notified

## 2015-03-08 ENCOUNTER — Telehealth: Payer: Self-pay | Admitting: *Deleted

## 2015-03-08 NOTE — Telephone Encounter (Signed)
-----   Message from [name], MD sent at 03/01/2015  8:41 AM EDT ----- Screening HIV negative  Normal BMP Normal vit D

## 2015-03-08 NOTE — Telephone Encounter (Signed)
Pt aware of lab results 

## 2015-03-22 ENCOUNTER — Telehealth: Payer: Self-pay | Admitting: *Deleted

## 2015-03-22 DIAGNOSIS — M797 Fibromyalgia: Secondary | ICD-10-CM

## 2015-03-22 DIAGNOSIS — G894 Chronic pain syndrome: Secondary | ICD-10-CM

## 2015-03-22 MED ORDER — GABAPENTIN 300 MG PO CAPS: 600.0000 mg | ORAL_CAPSULE | Freq: Three times a day (TID) | ORAL | Status: DC

## 2015-03-22 NOTE — Telephone Encounter (Signed)
Requesting refill of her gabapentin.  According to notes in Epic patient was changed to gabapentin 600 mg three times per day.  Rx sent.

## 2015-03-28 ENCOUNTER — Ambulatory Visit: Payer: Medicaid Other | Attending: Family Medicine | Admitting: Family Medicine

## 2015-03-28 ENCOUNTER — Encounter: Payer: Self-pay | Admitting: Family Medicine

## 2015-03-28 VITALS — BP 127/81 | HR 78 | Temp 97.7°F | Resp 16 | Ht 65.0 in | Wt 159.0 lb

## 2015-03-28 DIAGNOSIS — Z0000 Encounter for general adult medical examination without abnormal findings: Secondary | ICD-10-CM

## 2015-03-28 DIAGNOSIS — Z124 Encounter for screening for malignant neoplasm of cervix: Secondary | ICD-10-CM

## 2015-03-28 DIAGNOSIS — R300 Dysuria: Secondary | ICD-10-CM

## 2015-03-28 DIAGNOSIS — Z1211 Encounter for screening for malignant neoplasm of colon: Secondary | ICD-10-CM | POA: Insufficient documentation

## 2015-03-28 DIAGNOSIS — Z720 Tobacco use: Secondary | ICD-10-CM

## 2015-03-28 DIAGNOSIS — J302 Other seasonal allergic rhinitis: Secondary | ICD-10-CM

## 2015-03-28 DIAGNOSIS — F17200 Nicotine dependence, unspecified, uncomplicated: Secondary | ICD-10-CM

## 2015-03-28 DIAGNOSIS — Z7189 Other specified counseling: Secondary | ICD-10-CM | POA: Insufficient documentation

## 2015-03-28 DIAGNOSIS — K5900 Constipation, unspecified: Secondary | ICD-10-CM

## 2015-03-28 DIAGNOSIS — M797 Fibromyalgia: Secondary | ICD-10-CM

## 2015-03-28 LAB — POCT URINALYSIS DIPSTICK
Bilirubin, UA: NEGATIVE
Glucose, UA: NEGATIVE
Ketones, UA: NEGATIVE
Nitrite, UA: NEGATIVE
Protein, UA: NEGATIVE
Spec Grav, UA: <=1.005
Urobilinogen, UA: 0.2
pH, UA: 6.5

## 2015-03-28 MED ORDER — NICOTINE 14 MG/24HR TD PT24: 14.0000 mg | MEDICATED_PATCH | Freq: Every day | TRANSDERMAL | Status: DC

## 2015-03-28 MED ORDER — NICOTINE 7 MG/24HR TD PT24: 7.0000 mg | MEDICATED_PATCH | Freq: Every day | TRANSDERMAL | Status: DC

## 2015-03-28 MED ORDER — POLYETHYLENE GLYCOL 3350 17 GM/SCOOP PO POWD: 17.0000 g | Freq: Two times a day (BID) | ORAL | Status: DC | PRN

## 2015-03-28 MED ORDER — NICOTINE 21 MG/24HR TD PT24: 21.0000 mg | MEDICATED_PATCH | Freq: Every day | TRANSDERMAL | Status: DC

## 2015-03-28 MED ORDER — CETIRIZINE HCL 10 MG PO TABS: 10.0000 mg | ORAL_TABLET | Freq: Every day | ORAL | Status: DC

## 2015-03-28 NOTE — Patient Instructions (Signed)
[name],  [name] to see you today. You will be called with pap results.  1. Smoking cessation: Smoking cessation support: smoking cessation hotline: 1-800-QUIT-NOW.  Smoking cessation classes are available through [facility] and [facility]. Call [phone] or visit our website at [email or web address] Start patch 21 mg for 6 weeks 14 mg for 2 weekes 7 mg for 2 weeks  2. I will give a call back to [facility] about tramadol for your back.   3. Health maintenance: Mammogram ordered GI referral for screening colonoscopy Optometry referral Dental referral   F/u in 3 months   Dr. Adrian Blackwater

## 2015-03-28 NOTE — Progress Notes (Signed)
   Subjective:    Patient ID: [name], female    DOB: Jul 22, 1961, 54 y.o.   MRN: [id number] CC; wellness physical with pap  HPI Here for wellness physical with pap   1. Fibromyalgia: improved with gabapentin. Would like tramadol for back pain.   2. Smoker: desires to quit. Interested in patch and chantix.    Review of Systems Patient Information Form: Screening and ROS Review of Symptoms  General:  Negative for unexplained weight loss, fever Skin: Negative for new or changing mole, sore that won't heal HEENT: Negative for trouble hearing, trouble seeing, ringing in ears, mouth sores, hoarseness, change in voice, dysphagia. CV:  Negative for chest pain, dyspnea, edema, palpitations Resp: Negative for cough, dyspnea, hemoptysis GI: Negative for nausea, vomiting, diarrhea, constipation, abdominal pain, melena, hematochezia. GU: Negative for dysuria, incontinence, urinary hesitance, hematuria, vaginal or penile discharge, polyuria, sexual difficulty, lumps in testicle or breasts MSK: Positive for muscle cramps or aches, joint pain. Negative for swelling Neuro: Negative for headaches, weakness, numbness, dizziness, passing out/fainting Psych: Negative for depression, anxiety, memory problems      Objective:   Physical Exam BP 127/81 mmHg  Pulse 78  Temp(Src) 97.7 F (36.5 C) (Oral)  Resp 16  Ht 5\' 5"  (1.651 m)  Wt 159 lb (72.122 kg)  BMI 26.46 kg/m2  SpO2 98%  General Appearance:    Alert, cooperative, no distress, appears stated age  Head:    Normocephalic, without obvious abnormality, atraumatic  Eyes:    PERRL, conjunctiva/corneas clear, EOM's intact, fundi    benign, both eyes  Ears:    Normal TM's and external ear canals, both ears  Nose:   Nares normal, septum midline, mucosa normal, no drainage    or sinus tenderness  Throat:   Lips, mucosa, and tongue normal; teeth and gums normal  Neck:   Supple, symmetrical, trachea midline, no adenopathy;    thyroid:  no  enlargement/tenderness/nodules; no carotid   bruit or JVD  Back:     Symmetric, no curvature, ROM normal, no CVA tenderness  Lungs:     Clear to auscultation bilaterally, respirations unlabored  Chest Wall:    No tenderness or deformity   Heart:    Regular rate and rhythm, S1 and S2 normal, no murmur, rub   or gallop  Breast Exam:    No tenderness, masses, or nipple abnormality  Abdomen:     Soft, non-tender, bowel sounds active all four quadrants,    no masses, no organomegaly  Genitalia:    Normal female without lesion, discharge or tenderness  Rectal:    Normal tone, normal prostate, no masses or tenderness;   guaiac negative stool  Extremities:   Extremities normal, atraumatic, no cyanosis or edema  Pulses:   2+ and symmetric all extremities  Skin:   Skin color, texture, turgor normal, no rashes or lesions  Lymph nodes:   Cervical, supraclavicular, and axillary nodes normal  Neurologic:   CNII-XII intact, normal strength, sensation and reflexes    throughout         Assessment & Plan:

## 2015-03-29 LAB — CERVICOVAGINAL ANCILLARY ONLY
Chlamydia: NEGATIVE
Neisseria Gonorrhea: NEGATIVE
Wet Prep (BD Affirm): NEGATIVE

## 2015-03-29 LAB — CYTOLOGY - PAP

## 2015-03-30 ENCOUNTER — Encounter: Payer: Self-pay | Admitting: Family Medicine

## 2015-03-30 NOTE — Assessment & Plan Note (Signed)
A; Smoking cessation: Smoking cessation support: smoking cessation hotline: 1-800-QUIT-NOW.  Smoking cessation classes are available through [facility] and [facility]. Call [phone] or visit our website at [email or web address] Start patch 21 mg for 6 weeks 14 mg for 2 weekes 7 mg for 2 weeks

## 2015-03-30 NOTE — Assessment & Plan Note (Signed)
Health maintenance: Mammogram ordered GI referral for screening colonoscopy Optometry referral Dental referral

## 2015-03-30 NOTE — Assessment & Plan Note (Signed)
A: improved with gabapentin. Still with back pain P: add tramadol prn back pain after discussing with patient's psychiatrist.

## 2015-03-30 NOTE — Assessment & Plan Note (Signed)
Pap done today  

## 2015-04-02 ENCOUNTER — Telehealth: Payer: Self-pay | Admitting: *Deleted

## 2015-04-02 DIAGNOSIS — F1011 Alcohol abuse, in remission: Secondary | ICD-10-CM

## 2015-04-02 NOTE — Telephone Encounter (Signed)
Patient called in to see if Dr. Adrian Blackwater had had a chance to speak to her psychiatrist about her getting a Tramadol prescription.

## 2015-04-03 ENCOUNTER — Telehealth: Payer: Self-pay | Admitting: *Deleted

## 2015-04-03 NOTE — Telephone Encounter (Signed)
-----   Message from [name], MD sent at 03/29/2015  8:41 AM EDT ----- Negative wet prep

## 2015-04-03 NOTE — Telephone Encounter (Signed)
Pt aware of results 

## 2015-04-03 NOTE — Telephone Encounter (Signed)
-----   Message from [name], MD sent at 03/29/2015  8:59 AM EDT ----- Wynonia Hazard

## 2015-04-03 NOTE — Telephone Encounter (Signed)
Spoke to patient's psychiatrist, Debbora Dus today. I  gained some insight into her medical history. At this point we will avoid all controlled substances.  I would like to add mobic this is an NSAID for back pain if patient believes can tolerate it.

## 2015-04-03 NOTE — Telephone Encounter (Signed)
-----   Message from [name], MD sent at 04/01/2015 12:52 PM EDT ----- Normal pap smear

## 2015-04-04 NOTE — Telephone Encounter (Signed)
Noted.  How about diclofenac (voltaren)?  Please ask patient to give a list of NSAIDs she has tried in the past.

## 2015-07-01 DIAGNOSIS — C50919 Malignant neoplasm of unspecified site of unspecified female breast: Secondary | ICD-10-CM

## 2015-07-22 DIAGNOSIS — F1021 Alcohol dependence, in remission: Secondary | ICD-10-CM | POA: Insufficient documentation

## 2015-07-22 DIAGNOSIS — Z720 Tobacco use: Secondary | ICD-10-CM | POA: Insufficient documentation

## 2016-05-21 ENCOUNTER — Telehealth: Payer: Self-pay | Admitting: Oncology

## 2016-05-21 ENCOUNTER — Encounter: Payer: Self-pay | Admitting: Oncology

## 2016-05-21 NOTE — Telephone Encounter (Signed)
Appointment with [facility] on 7/5 at 4:30pm. Aware to arrive 30 minutes early. Demographics verified. Patient is moving from [place] in July  and didn't want a gap in treatment. Records sent to Radiation Oncology Department.

## 2016-05-25 ENCOUNTER — Telehealth: Payer: Self-pay | Admitting: *Deleted

## 2016-05-25 NOTE — Telephone Encounter (Signed)
Received appt date/time from Andrea.  Mailed new pt packet to pt.  

## 2016-05-29 NOTE — Progress Notes (Signed)
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 ,intermediate grade ductal carcinoma in situ   Receptor Status: ER(+), PR (), Her2-neu (3+), Ki-()  Did patient present with symptoms (if so, please note symptoms) or was this found on screening mammography?:   Past/Anticipated interventions by surgeon, if ZOX:WRUE Breast Mastectomy  08/21/15 and ALND with extensive multifocal carcinoma,mixed lobular and ductal , left axillary  Sentinel lymph nodes 5/5 positive for metastatic carcinoma, , skin,left breast at 10:00 excision: =dermal acute and chronic inflammation with histiocytic reaction.Left breast mastectomy: Invasive intermediate grade mammary carcinoma demonstration mixed ductal and lobular features Sentinel and axillary lymph nodes positive 14/15 for metastatic carcinoma.   Past/Anticipated interventions by medical oncology, if any: Chemotherapy : Taxotere, Herceptin and Perjeta q 3 weeks and monthly [facility] Dr. Duwaine Maxin. Lassiter,MD sen 05/08/16 office note, will see Dr. [name] 06/03/2016  Lymphedema issues, if any:    Pain issues, if any:    SAFETY ISSUES:  Prior radiation?   Pacemaker/ICD?   Possible current pregnancy?No  Is the patient on methotrexate? No  Current Complaints / other details:  Hx anxiety and BiPolar seeing Pyschiatrist, sleep disturbance even taking 348m trazodone  At bedtime.      Shannon Eaton RN 05/29/2016,1:27 PM

## 2016-06-01 ENCOUNTER — Other Ambulatory Visit: Payer: Self-pay

## 2016-06-01 ENCOUNTER — Ambulatory Visit
Admission: RE | Admit: 2016-06-01 | Discharge: 2016-06-01 | Disposition: A | Discharge status: Home / Self Care | Payer: Medicaid Other | Source: Ambulatory Visit | Attending: Radiation Oncology | Admitting: Radiation Oncology

## 2016-06-01 ENCOUNTER — Encounter: Payer: Self-pay | Admitting: Radiation Oncology

## 2016-06-01 ENCOUNTER — Ambulatory Visit
Admission: RE | Admit: 2016-06-01 | Discharge: 2016-06-01 | Disposition: A | Discharge status: Home / Self Care | Payer: Medicaid Other | Source: Ambulatory Visit | Admitting: Radiation Oncology

## 2016-06-01 DIAGNOSIS — C50312 Malignant neoplasm of lower-inner quadrant of left female breast: Secondary | ICD-10-CM | POA: Diagnosis present

## 2016-06-01 DIAGNOSIS — Z510 Encounter for antineoplastic radiation therapy: Secondary | ICD-10-CM | POA: Diagnosis present

## 2016-06-01 DIAGNOSIS — Z170 Estrogen receptor positive status [ER+]: Secondary | ICD-10-CM | POA: Insufficient documentation

## 2016-06-01 MED ORDER — SONAFINE EX EMUL
1.0000 "application " | Freq: Two times a day (BID) | CUTANEOUS | Status: DC
  Administered 2016-06-01: 1 via TOPICAL

## 2016-06-01 NOTE — Addendum Note (Signed)
Encounter addended by: Kyung Rudd, MD on: 06/01/2016  4:36 PM<BR>     Documentation filed: Problem List

## 2016-06-01 NOTE — Progress Notes (Addendum)
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 ,intermediate grade ductal carcinoma in situ   Receptor Status: ER(+), PR (), Her2-neu (3+), Ki-()  Did patient present with symptoms (if so, please note symptoms) or was this found on screening mammography?:   Past/Anticipated interventions by surgeon, if KDP:TELM Breast Mastectomy  08/21/15 and ALND with extensive multifocal carcinoma,mixed lobular and ductal , left axillary  Sentinel lymph nodes 5/5 positive for metastatic carcinoma, , skin,left breast at 10:00 excision: =dermal acute and chronic inflammation with histiocytic reaction.Left breast mastectomy: Invasive intermediate grade mammary carcinoma demonstration mixed ductal and lobular features Sentinel and axillary lymph nodes positive 14/15 for metastatic carcinoma.   Past/Anticipated interventions by medical oncology, if any: Chemotherapy : Taxotere, Herceptin and Perjeta q 3 weeks and monthly [facility] Dr. Duwaine Maxin. Lassiter,MD sen 05/08/16 office note, will see Dr. [name] 06/03/2016, last Tuesday Herceptin   Lymphedema issues, if any:  No  Pain issues, if any: Soreness    SAFETY ISSUES: No  Prior radiation? Yes, [name]. [place] Dr. Maryan Rued has had  15 tx to left breast last radiation treatment this past Friday 6/30/217  Pacemaker/ICD?  NO  Possible current pregnancy?No  Is the patient on methotrexate? No  Current Complaints / other details:  Hx anxiety and BiPolar seeing Pyschiatrist, sleep disturbance even taking 385m trazodone  At bedtime.   [place]    [name] 06/01/2016,8:49 AM  BP 113/81 mmHg  Pulse 75  Temp(Src) 98.1 F (36.7 C) (Oral)  Resp 20  Ht 5' 5.5" (1.664 m)  Wt 159 lb 1.6 oz (72.167 kg)  BMI 26.06 kg/m2   SpO2 100%  Wt Readings from Last 3 Encounters:  06/01/16 159 lb 1.6 oz (72.167 kg)  03/28/15 159 lb (72.122 kg)  02/28/15 155 lb (70.308 kg)

## 2016-06-01 NOTE — Progress Notes (Signed)
Radiation Oncology         [phone] ________________________________  Name: [name] MRN: [id number]  Date: 06/01/2016  DOB: 03/25/1961  [id number], Lennox Laity, MD  [name], MD     REFERRING PHYSICIAN: [name], MD   DIAGNOSIS: The encounter diagnosis was Malignant neoplasm of lower-inner quadrant of left female breast ([place]).   HISTORY OF PRESENT ILLNESS: [name] is a 55 y.o. female seen at the request of Dr. Earlie Counts in [place] for a diagnosis of stage IV breast cancer. She noticed a lump the size of a lemon in her left axillary region for a year prior to her mammogram. She requested a mammogram after her breast started swelling. The patient was found to have an abnormality on her mammogram in August on the left breast was biopsied and found to have invasive lobular carcinoma. Her biopsy revealed ER positivity and HER-2 positivity. She subsequently underwent mastectomy with axillary lymph node dissection in tissue expander placement on 08/21/2015. The tumor was consistent with invasive indeterminate grade mammary carcinoma with mixed ductal and lobular features, and high-grade ductal carcinoma in situ, the tumor size was 1.3 cm, sentinel axillary lymph nodes demonstrated 4 out of 15 being positive for metastatic carcinoma. She was found to have a sclerotic lesion in the posterior left iliac bone, there is questionable lesions in the liver, as well as pulmonary nodules. She began systemic chemotherapy in November 2016 with Taxotere, Herceptin projection. She completed her Taxotere and March 2017 and continues on Herceptin and Perjeta with monthly Xgeva. She did have a posttreatment CT scan to restage on 03/02/2016, this revealed multiple new ill-defined right upper lobe pulmonary parenchymal nodules, lobular dependent pleural fluid and potential mild pleural thickening bilaterally, numerous low-attenuation foci again are present within the liver slightly increased that  have not significantly changed in size. The sclerotic lesion in the posterior left iliac bone was stable. She began radiotherapy on 05/08/2016 and was to receive 33 fractions. She has moved to [facility], and is interested in receiving remainder of her radiotherapy care with Dr. Lisbeth Renshaw and will meet Dr. [name] on Wednesday.   PREVIOUS RADIATION THERAPY: Yes she is currently in the process of receiving radiotherapy to the left chest wall and lymph nodes, received 15 fractions per Dr. Maryan Rued near [place], [place].   PAST MEDICAL HISTORY:  Past Medical History  Diagnosis Date  . Depression   . PTSD (post-traumatic stress disorder)   . Alcohol abuse   . Chronic pain   . Opiate dependence ([place])   . Bipolar disorder ([place])   . Anxiety     At age 38  . Arthritis Dx 2010  . Fibromyalgia Dx 2005       PAST SURGICAL HISTORY: Past Surgical History  Procedure Laterality Date  . Fibula fracture surgery       FAMILY HISTORY:  Family History  Problem Relation Age of Onset  . Diabetes Mother      SOCIAL HISTORY:  reports that she has been smoking Cigarettes.  She has been smoking about 1.00 pack per day. She has never used smokeless tobacco. She reports that she drinks alcohol. 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 She is a recovering alcoholic and has been clean also from using prescription pain pills for about 4 years time.  ALLERGIES: Demerol and Erythromycin   MEDICATIONS:  Current Outpatient Prescriptions  Medication  Sig Dispense Refill  . cetirizine (ZYRTEC) 10 MG tablet Take 1 tablet (10 mg total) by mouth daily. 30 tablet 11  . fluticasone (FLONASE) 50 MCG/ACT nasal spray Place 1 spray into the nose daily.    [name] gabapentin (NEURONTIN) 300 MG capsule Take 2 capsules (600 mg total) by mouth 3 (three) times daily. 300 mg at night for one day, twice daily for one  day, then three times daily 180 capsule 2  . lamoTRIgine (LAMICTAL) 100 MG tablet Take 150 mg by mouth 2 (two) times daily.     [name] lithium carbonate 300 MG capsule Take 300 mg by mouth 2 (two) times daily with a meal.    . nicotine (NICODERM CQ - DOSED IN MG/24 HOURS) 14 mg/24hr patch Place 1 patch (14 mg total) onto the skin daily. (Patient not taking: Reported on 06/01/2016) 14 patch 0  . nicotine (NICODERM CQ - DOSED IN MG/24 HOURS) 21 mg/24hr patch Place 1 patch (21 mg total) onto the skin daily. (Patient not taking: Reported on 06/01/2016) 42 patch 0  . nicotine (NICODERM CQ - DOSED IN MG/24 HR) 7 mg/24hr patch Place 1 patch (7 mg total) onto the skin daily. (Patient not taking: Reported on 06/01/2016) 14 patch 0  . ondansetron (ZOFRAN) 8 MG tablet Take 1 tablet (8 mg total) by mouth every 8 (eight) hours as needed for nausea or vomiting. 30 tablet 1  . pantoprazole (PROTONIX) 40 MG tablet Take 1 tablet (40 mg total) by mouth daily. 30 tablet 3  . polyethylene glycol powder (GLYCOLAX/MIRALAX) powder Take 17 g by mouth 2 (two) times daily as needed for mild constipation. 3350 g 3  . potassium chloride (K-DUR) 10 MEQ tablet Take 10 mEq by mouth daily.    . pregabalin (LYRICA) 75 MG capsule Take 1 capsule (75 mg total) by mouth 2 (two) times daily. (Patient not taking: Reported on 06/01/2016) 60 capsule 3  . prochlorperazine (COMPAZINE) 10 MG tablet Take 10 mg by mouth daily as needed.    [name] tiZANidine (ZANAFLEX) 4 MG tablet Take 4 mg by mouth daily.    . traMADol (ULTRAM) 50 MG tablet Take 50 mg by mouth every 6 (six) hours as needed.    . traZODone (DESYREL) 100 MG tablet Take 300 mg by mouth at bedtime.     . triamcinolone cream (KENALOG) 0.5 % Apply 1 application topically daily as needed.  1  . trolamine salicylate (ASPERCREME) 10 % cream Apply 1 application topically as needed for muscle pain. Reported on 06/01/2016    . Vitamin D, Cholecalciferol, 1000 units CAPS Take 1,000 Units by mouth daily.      No current facility-administered medications for this encounter.    REVIEW OF SYSTEMS: On review of systems, the patient reports that she is doing well overall. She denies any chest pain, shortness of breath, cough, fevers, chills, night sweats, unintended weight changes. She denies any bowel or bladder disturbances, and denies abdominal pain, nausea or vomiting. She denies any new musculoskeletal or joint aches or pains. She denies any lymphedema. She mentions she has some soreness of her left breast. She mentions that the treatment area is itchy. She has been using triamcinalone and benadryl for this. A complete review of systems is obtained and is otherwise negative.   PHYSICAL EXAM:   Vitals with BMI 06/01/2016  Height 5' 5.5"  Weight 159 lbs 2 oz  BMI 75.3  Systolic 005  Diastolic 81  Pulse 75  Respirations 20  Pain scale 0/10 In general this is a well appearing caucasian woman in no acute distress. She is alert and oriented x4 and appropriate throughout the examination. HEENT reveals that the patient is normocephalic, atraumatic. EOMs are intact. PERRLA. Skin is intact without any evidence of gross lesions. Cardiovascular exam reveals a regular rate and rhythm, no clicks rubs or murmurs are auscultated. Chest is clear to auscultation bilaterally. Lymphatic assessment is performed and does not reveal any adenopathy in the cervical, supraclavicular, axillary, or inguinal chains. Abdomen has active bowel sounds in all quadrants and is intact. The abdomen is soft, non tender, non distended. Lower extremities are negative for pretibial pitting edema, deep calf tenderness, cyanosis or clubbing. On left breast evalulation, tissue expander/implant is in place. She has markings from previous radiotherapy and has dry desquamation along the left upper chest margin with basilar navi without evidence of wet desquamation or skin breakdown. The skin is erythematous consistent with preiovus treatment. No  breakdown of either is noted in the inframammary fold.   ECOG = 1 0 - Asymptomatic (Fully active, able to carry on all predisease activities without restriction)  1 - Symptomatic but completely ambulatory (Restricted in physically strenuous activity but ambulatory and able to carry out work of a light or sedentary nature. For example, light housework, office work)  2 - Symptomatic, <50% in bed during the day (Ambulatory and capable of all self care but unable to carry out any work activities. Up and about more than 50% of waking hours)  3 - Symptomatic, >50% in bed, but not bedbound (Capable of only limited self-care, confined to bed or chair 50% or more of waking hours)  4 - Bedbound (Completely disabled. Cannot carry on any self-care. Totally confined to bed or chair)  5 - Death   Eustace Pen MM, Creech RH, Tormey DC, et al. [phone]). "Toxicity and response criteria of the [facility]". [place] Oncol. 5 (6): 649-55    LABORATORY DATA:  Lab Results  Component Value Date   WBC 6.5 01/24/2015   HGB 13.1 01/24/2015   HCT 40.6 01/24/2015   MCV 96.0 01/24/2015   PLT 220 01/24/2015   Lab Results  Component Value Date   NA 144 02/28/2015   K 4.9 02/28/2015   CL 106 02/28/2015   CO2 28 02/28/2015   Lab Results  Component Value Date   ALT 13 04/18/2013   AST 21 04/18/2013   ALKPHOS 57 04/18/2013   BILITOT 0.1* 04/18/2013      RADIOGRAPHY: No results found.     IMPRESSION: Stage IV, T4, N3, M1 invasive lobular and ductal carcinoma, ER positive, HER-2 positive the left breast with disease in the left iliac bone and prior to initiating therapy, there was question of involvement of the L4 vertebral body, 2 levels in the mid to thoracic spine, and right scapula.  PLAN: Dr. Lisbeth Renshaw discusses the previous treatment the patient has received, and has recommended that she move forward with the remainder of her prescribed radiation treatment. We will plan to proceed  with simulation today, and again revealing the risks, benefits, short and long-term effects of radiation. She is interested in moving forward. She is encouraged to call should any questions or concerns during the process and we will plan to see her for an under treatment visit later this week.  We will tentatively start treatment Wednesday this week to resume her treatments. We discussed that she can start using Sonafine for the itchiness in the  treatment area to help alleviate it. She was given Sonafine and instructions with how to use it.  The above documentation reflects my direct findings during this shared patient visit. Please see the separate note by Dr. Lisbeth Renshaw on this date for the remainder of the patient's plan of care.    Carola Rhine, PAC

## 2016-06-03 ENCOUNTER — Ambulatory Visit
Admission: RE | Admit: 2016-06-03 | Discharge: 2016-06-03 | Disposition: A | Discharge status: Home / Self Care | Payer: Medicaid Other | Source: Ambulatory Visit | Attending: Radiation Oncology | Admitting: Radiation Oncology

## 2016-06-03 ENCOUNTER — Ambulatory Visit (HOSPITAL_BASED_OUTPATIENT_CLINIC_OR_DEPARTMENT_OTHER): Payer: Medicaid Other | Admitting: Oncology

## 2016-06-03 ENCOUNTER — Other Ambulatory Visit (HOSPITAL_BASED_OUTPATIENT_CLINIC_OR_DEPARTMENT_OTHER): Payer: Medicaid Other

## 2016-06-03 VITALS — BP 119/72 | HR 82 | Temp 97.8°F | Resp 18 | Ht 65.5 in | Wt 157.4 lb

## 2016-06-03 DIAGNOSIS — F319 Bipolar disorder, unspecified: Secondary | ICD-10-CM | POA: Diagnosis not present

## 2016-06-03 DIAGNOSIS — D649 Anemia, unspecified: Secondary | ICD-10-CM

## 2016-06-03 DIAGNOSIS — C50312 Malignant neoplasm of lower-inner quadrant of left female breast: Secondary | ICD-10-CM

## 2016-06-03 DIAGNOSIS — C773 Secondary and unspecified malignant neoplasm of axilla and upper limb lymph nodes: Secondary | ICD-10-CM

## 2016-06-03 DIAGNOSIS — C7951 Secondary malignant neoplasm of bone: Secondary | ICD-10-CM | POA: Diagnosis not present

## 2016-06-03 DIAGNOSIS — Z170 Estrogen receptor positive status [ER+]: Secondary | ICD-10-CM

## 2016-06-03 DIAGNOSIS — Z510 Encounter for antineoplastic radiation therapy: Secondary | ICD-10-CM | POA: Diagnosis not present

## 2016-06-03 LAB — CBC WITH DIFFERENTIAL/PLATELET
BASO%: 0.6 % (ref 0.0–2.0)
Basophils Absolute: 0 10*3/uL (ref 0.0–0.1)
EOS%: 3.3 % (ref 0.0–7.0)
Eosinophils Absolute: 0.2 10*3/uL (ref 0.0–0.5)
HCT: 34.7 % — ABNORMAL LOW (ref 34.8–46.6)
HGB: 11.3 g/dL — ABNORMAL LOW (ref 11.6–15.9)
LYMPH%: 15 % (ref 14.0–49.7)
MCH: 27 pg (ref 25.1–34.0)
MCHC: 32.5 g/dL (ref 31.5–36.0)
MCV: 83.2 fL (ref 79.5–101.0)
MONO#: 0.5 10*3/uL (ref 0.1–0.9)
MONO%: 8 % (ref 0.0–14.0)
NEUT#: 4.3 10*3/uL (ref 1.5–6.5)
NEUT%: 73.1 % (ref 38.4–76.8)
Platelets: 201 10*3/uL (ref 145–400)
RBC: 4.17 10*6/uL (ref 3.70–5.45)
RDW: 15.9 % — ABNORMAL HIGH (ref 11.2–14.5)
WBC: 5.9 10*3/uL (ref 3.9–10.3)
lymph#: 0.9 10*3/uL (ref 0.9–3.3)

## 2016-06-03 LAB — COMPREHENSIVE METABOLIC PANEL
ALT: 14 U/L (ref 0–55)
AST: 19 U/L (ref 5–34)
Albumin: 3.8 g/dL (ref 3.5–5.0)
Alkaline Phosphatase: 48 U/L (ref 40–150)
Anion Gap: 8 mEq/L (ref 3–11)
BUN: 10.2 mg/dL (ref 7.0–26.0)
CO2: 26 mEq/L (ref 22–29)
Calcium: 9.4 mg/dL (ref 8.4–10.4)
Chloride: 107 mEq/L (ref 98–109)
Creatinine: 0.9 mg/dL (ref 0.6–1.1)
EGFR: 70 mL/min/{1.73_m2} — ABNORMAL LOW (ref >90)
Glucose: 105 mg/dl (ref 70–140)
Potassium: 4 mEq/L (ref 3.5–5.1)
Sodium: 141 mEq/L (ref 136–145)
Total Bilirubin: <0.3 mg/dL (ref 0.20–1.20)
Total Protein: 6.5 g/dL (ref 6.4–8.3)

## 2016-06-03 MED ORDER — RADIAPLEXRX EX GEL
Freq: Once | CUTANEOUS | Status: AC
  Administered 2016-06-03: 15:00:00 via TOPICAL

## 2016-06-03 MED ORDER — ALRA NON-METALLIC DEODORANT (RAD-ONC)
1.0000 "application " | Freq: Once | TOPICAL | Status: AC
  Administered 2016-06-03: 1 via TOPICAL

## 2016-06-03 NOTE — Progress Notes (Addendum)
[facility]  [phone] [phone]     ID: [name] DOB: Sep 06, 1961  MR#: [id number]  TOI#:[id number]  Patient Care Team: Larey Dresser, MD as PCP - General (Cardiology) Kyung Rudd, MD as Consulting Physician (Radiation Oncology) PCP: Loralie Champagne, MD Chauncey Cruel, MD GYN: SU:  OTHER MD: Thelma Comp MD (med onc) Jill Poling (psych),   CHIEF COMPLAINT:   CURRENT TREATMENT:    BREAST CANCER HISTORY: [name] was aware of a "lemon sized lump in" her left axilla for about a year before bringing it to medical attention. By then she had developed left breast and left axillary swelling (June 2016). She presented to the local emergency room and had a chest CT scan 06/06/2015 which showed a nodule in the left breast measuring 0.9 cm and questionable left axillary adenopathy. She then proceeded to bilateral diagnostic mammography and left breast ultrasonography 06/19/2015. There were no prior films for comparison (last mammography 12 years prior).. The breast density was category C. Mammography showed in the left breast upper inner quadrant a 7 cm area including a small mass and significant pleomorphic calcifications. Ultrasonography defined the mass as measuring 1.2 cm. The left axilla appeared unremarkable. There was significant skin edema.  Biopsy of the left breast mass 06/19/2015 showed (SP [id number]) an invasive ductal carcinoma, grade 2, estrogen receptor 83% positive, progesterone receptor 26% positive, and HER-2 amplified by immunohistochemstry with a 3+ reading. The patient had biopsies of a separate area in the left breast August of the same year and this showed atypical ductal hyperplasia. (SP [id number]).  Accordingly after appropriate discussion on 08/21/2015 the patient proceeded to left mastectomy with left axillary sentinel lymph node sampling, which, since the lymph nodes were positive, extended to the procedure to left axillary lymph  node dissection. The pathology (SP [id number]) showed an invasive ductal carcinoma, grade 3, measuring in excess of 9 cm. There were also skin satellites, not contiguous with the invasive carcinoma. Margins were clear and ample. There was evidence of lymphovascular invasion. A total of 15 lymph nodes were removed, including 5 sentinel lymph nodes, all of which were positive, so that the final total was 14 out of 15 lymph nodes involved by tumor. There was evidence of extranodal extension. The final pathology was pT4b pN2a, stage IIIB  CA-27-29 and CEA 09/19/2015 were non-informative October 2016.  Unfortunately CT scans of the chest abdomen and pelvis 09/16/2015 showed bony metastases to the right scapula, left iliac crest, and also L4 and T-spine. There were questionable liver cysts which on repeat CT scan 03/02/2016 appear to be a little bit more well-defined, possibly a little larger. There were also some possible right upper lobe lung lesions.  Adjuvant treatment consisted of docetaxel, trastuzumab and pertuzumab, with the final (6th) docetaxel dose given 02/11/2016. She continues on trastuzumab and pertuzumab, with the 11th cycle given 05/05/2016. Echocardiogram 02/26/2016 showed an ejection fraction of 55%. She receives denosumab/Xgeva every 6 weeks.. She also receives radiation, started 06/09/201, to be completed 06/26/2016.  Her subsequent history is as detailed below  INTERVAL HISTORY: [name] was evaluated in the breast clinic 06/03/2016   REVIEW OF SYSTEMS: She feels extremely fatigued. Despite that she gets up for a early in the morning and is up most of the day. Is to start to get anything down. She has severe headaches, primarily nuchal. She does say these have been present for "years". She tolerated her chemotherapy well without any peripheral neuropathy residual. Her hair is coming back. She  denies any cough, phlegm production or pleurisy, or any black or tarry stools or blood in her  stools. A detailed review of systems today was otherwise noncontributory  PAST MEDICAL HISTORY: Past Medical History  Diagnosis Date  . Depression   . PTSD (post-traumatic stress disorder)   . Alcohol abuse   . Chronic pain   . Opiate dependence ([place])   . Bipolar disorder ([place])   . Anxiety     At age 28  . Arthritis Dx 2010  . Fibromyalgia Dx 2005    PAST SURGICAL HISTORY: Past Surgical History  Procedure Laterality Date  . Fibula fracture surgery      FAMILY HISTORY Family History  Problem Relation Age of Onset  . Diabetes Mother   The patient's father still living, age 33, in [place]. He had prostate cancer at some point in the past. The patient's mother died at age 10 from complications of diabetes. The patient had no brothers, 2 sisters. A paternal grandmother had lung cancer in the setting of tobacco abuse. There is no other history of cancer in the family to her knowledge  GYNECOLOGIC HISTORY:  No LMP recorded. Patient has had an ablation. Menarche approximately age 40. First live birth in 75. The patient is GX P2. She underwent endometrial ablation in 2016.  SOCIAL HISTORY:  The patient is originally from [place]. She has lived in [place] before but more recently was in [place]. She is back here because she could not afford her rent in [place]. She is living here and a temporary situation. She has divorced. HER-2 children are [name] who lives in [place] and works as a Development worker, community, and [name] who also lives in [place] and works as a Catering manager. The patient has a grandchild, 17 years old as of July 2017, living in [place] with his mother. The patient has not established herself with a local church yet.    ADVANCED DIRECTIVES: Not in place; at the 06/03/2016 visit the patient was given the appropriate forms to complete and notarize at her discretion   HEALTH MAINTENANCE: Social History  Substance Use Topics  . Smoking status: Current Every  Day Smoker -- 1.00 packs/day    Types: Cigarettes  . Smokeless tobacco: Never Used  . Alcohol Use: Yes     Comment: no ETOH since May     Colonoscopy:  PAP:  Bone density:   Allergies  Allergen Reactions  . Demerol Itching and Nausea And Vomiting  . Erythromycin Rash    Can take zpak    Current Outpatient Prescriptions  Medication Sig Dispense Refill  . cetirizine (ZYRTEC) 10 MG tablet Take 1 tablet (10 mg total) by mouth daily. 30 tablet 11  . fluticasone (FLONASE) 50 MCG/ACT nasal spray Place 1 spray into the nose daily.    [name] gabapentin (NEURONTIN) 300 MG capsule Take 2 capsules (600 mg total) by mouth 3 (three) times daily. 300 mg at night for one day, twice daily for one day, then three times daily 180 capsule 2  . hyaluronate sodium (RADIAPLEXRX) GEL Apply 1 application topically.    . lamoTRIgine (LAMICTAL) 100 MG tablet Take 150 mg by mouth 2 (two) times daily.     [name] lithium carbonate 300 MG capsule Take 300 mg by mouth 2 (two) times daily with a meal.    . [START ON 4/0/9811] non-metallic deodorant (ALRA) MISC Apply 1 application topically daily as needed.    . ondansetron (ZOFRAN) 8 MG tablet Take 1 tablet (8 mg total)  by mouth every 8 (eight) hours as needed for nausea or vomiting. 30 tablet 1  . pantoprazole (PROTONIX) 40 MG tablet Take 1 tablet (40 mg total) by mouth daily. 30 tablet 3  . polyethylene glycol powder (GLYCOLAX/MIRALAX) powder Take 17 g by mouth 2 (two) times daily as needed for mild constipation. 3350 g 3  . potassium chloride (K-DUR) 10 MEQ tablet Take 10 mEq by mouth daily.    . pregabalin (LYRICA) 75 MG capsule Take 1 capsule (75 mg total) by mouth 2 (two) times daily. (Patient not taking: Reported on 06/01/2016) 60 capsule 3  . prochlorperazine (COMPAZINE) 10 MG tablet Take 10 mg by mouth daily as needed.    [name] tiZANidine (ZANAFLEX) 4 MG tablet Take 4 mg by mouth daily.    . traMADol (ULTRAM) 50 MG tablet Take 50 mg by mouth every 6 (six) hours as needed.     . triamcinolone cream (KENALOG) 0.5 % Apply 1 application topically daily as needed.  1  . trolamine salicylate (ASPERCREME) 10 % cream Apply 1 application topically as needed for muscle pain. Reported on 06/01/2016    . Vitamin D, Cholecalciferol, 1000 units CAPS Take 1,000 Units by mouth daily.     No current facility-administered medications for this visit.    OBJECTIVE: Middle-aged white woman in no acute distress  Filed Vitals:   06/03/16 1527  BP: 119/72  Pulse: 82  Temp: 97.8 F (36.6 C)  Resp: 18     Body mass index is 25.79 kg/(m^2).    ECOG FS:1 - Symptomatic but completely ambulatory  Ocular: Sclerae unicteric, pupils equal, round and reactive to light Ear-nose-throat: Oropharynx clear and moist Lymphatic: No cervical or supraclavicular adenopathy Lungs no rales or rhonchi, good excursion bilaterally Heart regular rate and rhythm, no murmur appreciated Abd soft, nontender, positive bowel sounds MSK no focal spinal tenderness, no joint edema Neuro: non-focal, well-oriented, appropriate affect Breasts: The right breast is unremarkable. The left breast is status post mastectomy, with expander in place, and is currently receiving radiation. There is hyperpigmentation and some erythema. There is no evidence of local recurrence. The left axilla is benign.   LAB RESULTS:  CMP     Component Value Date/Time   NA 141 06/03/2016 1513   NA 144 02/28/2015 1007   K 4.0 06/03/2016 1513   K 4.9 02/28/2015 1007   CL 106 02/28/2015 1007   CO2 26 06/03/2016 1513   CO2 28 02/28/2015 1007   GLUCOSE 105 06/03/2016 1513   GLUCOSE 85 02/28/2015 1007   BUN 10.2 06/03/2016 1513   BUN 5* 02/28/2015 1007   CREATININE 0.9 06/03/2016 1513   CREATININE 0.89 02/28/2015 1007   CREATININE 1.15* 01/24/2015 0843   CALCIUM 9.4 06/03/2016 1513   CALCIUM 10.0 02/28/2015 1007   PROT 6.5 06/03/2016 1513   PROT 7.1 04/18/2013 1220   ALBUMIN 3.8 06/03/2016 1513   ALBUMIN 4.1 04/18/2013 1220    AST 19 06/03/2016 1513   AST 21 04/18/2013 1220   ALT 14 06/03/2016 1513   ALT 13 04/18/2013 1220   ALKPHOS 48 06/03/2016 1513   ALKPHOS 57 04/18/2013 1220   BILITOT <0.30 06/03/2016 1513   BILITOT 0.1* 04/18/2013 1220   GFRNONAA 53* 01/24/2015 0843   GFRAA 62* 01/24/2015 0843    INo results found for: SPEP, UPEP  Lab Results  Component Value Date   WBC 5.9 06/03/2016   NEUTROABS 4.3 06/03/2016   HGB 11.3* 06/03/2016   HCT 34.7* 06/03/2016  MCV 83.2 06/03/2016   PLT 201 06/03/2016      Chemistry      Component Value Date/Time   NA 141 06/03/2016 1513   NA 144 02/28/2015 1007   K 4.0 06/03/2016 1513   K 4.9 02/28/2015 1007   CL 106 02/28/2015 1007   CO2 26 06/03/2016 1513   CO2 28 02/28/2015 1007   BUN 10.2 06/03/2016 1513   BUN 5* 02/28/2015 1007   CREATININE 0.9 06/03/2016 1513   CREATININE 0.89 02/28/2015 1007   CREATININE 1.15* 01/24/2015 0843      Component Value Date/Time   CALCIUM 9.4 06/03/2016 1513   CALCIUM 10.0 02/28/2015 1007   ALKPHOS 48 06/03/2016 1513   ALKPHOS 57 04/18/2013 1220   AST 19 06/03/2016 1513   AST 21 04/18/2013 1220   ALT 14 06/03/2016 1513   ALT 13 04/18/2013 1220   BILITOT <0.30 06/03/2016 1513   BILITOT 0.1* 04/18/2013 1220       No results found for: LABCA2  No components found for: LABCA125  No results for input(s): INR in the last 168 hours.  Urinalysis    Component Value Date/Time   COLORURINE YELLOW 04/18/2013 Philip 04/18/2013 1222   LABSPEC 1.011 04/18/2013 1222   PHURINE 5.5 04/18/2013 1222   GLUCOSEU NEGATIVE 04/18/2013 1222   HGBUR TRACE* 04/18/2013 1222   BILIRUBINUR negative 03/28/2015 1321   BILIRUBINUR NEGATIVE 04/18/2013 1222   KETONESUR NEGATIVE 04/18/2013 1222   PROTEINUR negative 03/28/2015 1321   PROTEINUR NEGATIVE 04/18/2013 1222   UROBILINOGEN 0.2 03/28/2015 1321   UROBILINOGEN 0.2 04/18/2013 1222   NITRITE negative 03/28/2015 1321   NITRITE NEGATIVE 04/18/2013 1222    LEUKOCYTESUR Trace 03/28/2015 1321     STUDIES: Outside records reviewed   ELIGIBLE FOR AVAILABLE RESEARCH PROTOCOL: no  ASSESSMENT: 55 y.o. [place] woman with stage IV left-sided breast cancer involving bone, possibly also liver and lung   (1) s/p left breast lower inner quadrant biopsy 06/19/2015 for a clinical T2-3 NX invasive ductal carcinoma, grade 2, triple positive.  (2) status post left mastectomy and axillary lymph node dissection  with immediate expander placement 07/18/2015 for an mpT4 pN2,stage IIIB invasive ductal carcinoma, grade 3, with negative margins.  (a) definitive implant exchange to be scheduled in December   METASTATIC DISEASE: October 2016  (3) CT scan of the chest abdomen and pelvis  09/16/2015 shows metastatic lesions in the right scapula, left iliac crest, L4, and T spine. There were questionable liver cysts, with repeat CT scan 03/02/2016 showing possible right upper lobe lung lesions and possibly increased liver lesions  (4) received docetaxel every 3 weeks 6 together with trastuzumab and pertuzumab, last docetaxel dose 02/11/2016  (5) adjuvant radiation started 05/08/2016, to be completed 06/26/2016  (6) started trastuzumab and pertuzumab October 2016, continuing every 3 weeks,  (a) most recent echocardiogram 02/26/2016 showed a well preserved ejection fraction  (7) started the denosumab/Xgeva October 2017, repeated every 6 weeks  (8) to start anti-estrogen therapy At the completion of radiation  (9) history of bipolar disorder  (a) lithium level pending  (10) mild anemia with a significant drop in the MCV  (a) ferritin pending  PLAN: I spent a little over an hour reviewing M her situation and making plans for continuing treatment and further evaluation.She understands that stage IV breast cancer is not curable with our current knowledge base. The goal of treatment is control. The strategy of treatment is to do only the minimum necessary to  control the growth of the tumor so that the patient can have as normal a life as possible. There is no survival advantage in treating aggressively if treating less aggressively results in tumor control. With this strategy stage IV breast cancer in many cases can function as a "chronic illness": something that cannot be quite gotten rid of but can be controlled for an indefinite period of time  In HER-2 positive patients, and especially those who primarily have bone only disease, disease control can be very prolonged, and it is not unusual to have essential clearing of all disease activity with continuing treatment, which can last for many years. I am hopeful that will be the case with Warren Lacy.  She is due for restaging, and I have set her up for CT of the chest, bone scan, and CT of the brain, as 20% of HER-2 positive patients eventually develop brain metastatic disease. She also needs an echocardiogram and I am placing that referral as well. Finally she is due for right mammography sometime this month. All those orders have been entered.  She has not had her lithium level checked recently. We added that door lab work today, but I have urged her to establish yourself a can with the psychiatry group she was involved with previously.  I also obtained a baseline ferritin given the drop in her MCV. She has been eating ice lately as well.  She will have her next denosumab/Xgeva together with her next trastuzumab and pertuzumab on July 20. The next dose will be back on Tuesdays on 07/07/2016.  Kade has a good understanding of the overall plan. She agrees with it. She knows the goal of treatment in her case is cure. She will call with any problems that may develop before her next visit here  Chauncey Cruel, MD   06/03/2016 4:41 PM Medical Oncology and Hematology [facility] [place], [place] [address] Tel. [phone]    Fax. [phone]

## 2016-06-03 NOTE — Progress Notes (Signed)
pt education done, patient was given sonafine by MD on 06/01/16, however,she stated it was burning, and itching, patient skin from radiation is already red, and rashy looking, gave radiaplex and told to use that 2-3x day, after rad tx, and bedtime, and in am but not 4hours prior to rad tx, book, my business card, alra deodorant given, skin irritation, fatigue,swelling, redness iof breast,increase protein in diet,druink plenty water

## 2016-06-04 ENCOUNTER — Other Ambulatory Visit: Payer: Self-pay | Admitting: Oncology

## 2016-06-04 ENCOUNTER — Other Ambulatory Visit: Payer: Self-pay | Admitting: Radiation Oncology

## 2016-06-04 ENCOUNTER — Inpatient Hospital Stay
Admission: RE | Admit: 2016-06-04 | Discharge: 2016-06-04 | Disposition: A | Discharge status: Home / Self Care | Payer: Self-pay | Source: Ambulatory Visit | Attending: Radiation Oncology | Admitting: Radiation Oncology

## 2016-06-04 ENCOUNTER — Telehealth: Payer: Self-pay | Admitting: Oncology

## 2016-06-04 ENCOUNTER — Telehealth: Payer: Self-pay | Admitting: *Deleted

## 2016-06-04 ENCOUNTER — Ambulatory Visit
Admission: RE | Admit: 2016-06-04 | Discharge: 2016-06-04 | Disposition: A | Discharge status: Home / Self Care | Payer: Medicaid Other | Source: Ambulatory Visit | Attending: Radiation Oncology | Admitting: Radiation Oncology

## 2016-06-04 ENCOUNTER — Encounter: Payer: Self-pay | Admitting: *Deleted

## 2016-06-04 DIAGNOSIS — C801 Malignant (primary) neoplasm, unspecified: Secondary | ICD-10-CM

## 2016-06-04 DIAGNOSIS — Z510 Encounter for antineoplastic radiation therapy: Secondary | ICD-10-CM | POA: Diagnosis not present

## 2016-06-04 DIAGNOSIS — C50312 Malignant neoplasm of lower-inner quadrant of left female breast: Secondary | ICD-10-CM

## 2016-06-04 LAB — FERRITIN CHCC: Ferritin: 10 ng/mL — ABNORMAL LOW (ref 15–150)

## 2016-06-04 LAB — CANCER ANTIGEN 27.29: CA 27.29: 7 U/mL (ref 0.0–38.6)

## 2016-06-04 LAB — LITHIUM LEVEL: Lithium: 0.7 mmol/L (ref 0.6–1.2)

## 2016-06-04 MED ORDER — LORAZEPAM 0.5 MG PO TABS: 0.5000 mg | ORAL_TABLET | Freq: Three times a day (TID) | ORAL | Status: DC

## 2016-06-04 MED ORDER — LORAZEPAM 0.5 MG PO TABS
0.5000 mg | ORAL_TABLET | Freq: Once | ORAL | Status: AC
  Administered 2016-06-04: 0.5 mg via ORAL
  Filled 2016-06-04: qty 1

## 2016-06-04 NOTE — Progress Notes (Signed)
Patient requesting something to calm her nerves before treatment,  Per MD can give 0.5mg  atiavan  Sl  x1 today, verified with Elmo Putt RN ativan 0.5mg  tablet, asked pateint name and dob as identification again, asked if she had a driver, that she couldn't drive today if ativan was given, patient stated "Yes I'll call someoe to come pick me up, gave 0.5mg  ativan to the patsint SL, and asked if she needed rx in the future to talk with Dr. Lisbeth Renshaw tomorrow after rad tx, but if rx given to her she would need transportation and drive, patient gave verbal understanding,stated she had called for someone to pick her up today 10:33 AM

## 2016-06-04 NOTE — Telephone Encounter (Signed)
cld pt w/ appt and no answer-mailed copy of sch

## 2016-06-04 NOTE — Telephone Encounter (Signed)
Per staff message and POF I have scheduled appts. Advised scheduler of appts. JMW  

## 2016-06-04 NOTE — Progress Notes (Unsigned)
I called [name] no her iron is low. We are setting her up for [facility] on July 13 in July 20. She will receive denosumab July 20 as well.

## 2016-06-05 ENCOUNTER — Telehealth: Payer: Self-pay | Admitting: *Deleted

## 2016-06-05 ENCOUNTER — Ambulatory Visit
Admission: RE | Admit: 2016-06-05 | Discharge: 2016-06-05 | Disposition: A | Discharge status: Home / Self Care | Payer: Medicaid Other | Source: Ambulatory Visit | Attending: Radiation Oncology | Admitting: Radiation Oncology

## 2016-06-05 ENCOUNTER — Telehealth: Payer: Self-pay | Admitting: Oncology

## 2016-06-05 DIAGNOSIS — C50312 Malignant neoplasm of lower-inner quadrant of left female breast: Secondary | ICD-10-CM

## 2016-06-05 DIAGNOSIS — Z510 Encounter for antineoplastic radiation therapy: Secondary | ICD-10-CM | POA: Diagnosis not present

## 2016-06-05 MED ORDER — LORAZEPAM 0.5 MG PO TABS
0.5000 mg | ORAL_TABLET | Freq: Once | ORAL | Status: DC
  Filled 2016-06-05: qty 1

## 2016-06-05 NOTE — Progress Notes (Signed)
Department of Radiation Oncology  Phone:  [phone] Fax:        [phone]  Weekly Treatment Note    Name: [name] Date: 06/05/2016 MRN: [id number] DOB: 07-30-1961   Diagnosis:     ICD-9-CM ICD-10-CM   1. Primary cancer of lower-inner quadrant of left female breast (HCC) 174.3 C50.312 LORazepam (ATIVAN) tablet 0.5 mg     Current dose: 5.4 Gy  Current fraction:3   MEDICATIONS: Current Outpatient Prescriptions  Medication Sig Dispense Refill  . cetirizine (ZYRTEC) 10 MG tablet Take 1 tablet (10 mg total) by mouth daily. 30 tablet 11  . fluticasone (FLONASE) 50 MCG/ACT nasal spray Place 1 spray into the nose daily.    [name] gabapentin (NEURONTIN) 300 MG capsule Take 2 capsules (600 mg total) by mouth 3 (three) times daily. 300 mg at night for one day, twice daily for one day, then three times daily 180 capsule 2  . hyaluronate sodium (RADIAPLEXRX) GEL Apply 1 application topically.    . lamoTRIgine (LAMICTAL) 100 MG tablet Take 150 mg by mouth 2 (two) times daily.     [name] lithium carbonate 300 MG capsule Take 300 mg by mouth 2 (two) times daily with a meal.    . LORazepam (ATIVAN) 0.5 MG tablet Take 1 tablet (0.5 mg total) by mouth every 8 (eight) hours. 30 tablet 0  . non-metallic deodorant (ALRA) MISC Apply 1 application topically daily as needed.    . ondansetron (ZOFRAN) 8 MG tablet Take 1 tablet (8 mg total) by mouth every 8 (eight) hours as needed for nausea or vomiting. 30 tablet 1  . pantoprazole (PROTONIX) 40 MG tablet Take 1 tablet (40 mg total) by mouth daily. 30 tablet 3  . polyethylene glycol powder (GLYCOLAX/MIRALAX) powder Take 17 g by mouth 2 (two) times daily as needed for mild constipation. 3350 g 3  . potassium chloride (K-DUR) 10 MEQ tablet Take 10 mEq by mouth daily.    . pregabalin (LYRICA) 75 MG capsule Take 1 capsule (75 mg total) by mouth 2 (two) times daily. (Patient not taking: Reported on 06/01/2016) 60 capsule 3  . prochlorperazine (COMPAZINE) 10  MG tablet Take 10 mg by mouth daily as needed.    [name] tiZANidine (ZANAFLEX) 4 MG tablet Take 4 mg by mouth daily.    . traMADol (ULTRAM) 50 MG tablet Take 50 mg by mouth every 6 (six) hours as needed.    . triamcinolone cream (KENALOG) 0.5 % Apply 1 application topically daily as needed.  1  . trolamine salicylate (ASPERCREME) 10 % cream Apply 1 application topically as needed for muscle pain. Reported on 06/01/2016    . Vitamin D, Cholecalciferol, 1000 units CAPS Take 1,000 Units by mouth daily.     Current Facility-Administered Medications  Medication Dose Route Frequency Provider Last Rate Last Dose  . LORazepam (ATIVAN) tablet 0.5 mg  0.5 mg Oral Once Kyung Rudd, MD         ALLERGIES: Demerol and Erythromycin   LABORATORY DATA:  Lab Results  Component Value Date   WBC 5.9 06/03/2016   HGB 11.3* 06/03/2016   HCT 34.7* 06/03/2016   MCV 83.2 06/03/2016   PLT 201 06/03/2016   Lab Results  Component Value Date   NA 141 06/03/2016   K 4.0 06/03/2016   CL 106 02/28/2015   CO2 26 06/03/2016   Lab Results  Component Value Date   ALT 14 06/03/2016   AST 19 06/03/2016   ALKPHOS 48 06/03/2016  BILITOT <0.30 06/03/2016     NARRATIVE: [name] was seen today for weekly treatment management. The chart was checked and the patient's films were reviewed.  States she will be receiving an iron infusion with Dr. [name] soon. She is afraid after being told she may have internal bleeding. Reports radiation treatment is fine. She just has a lot of anxiety with everything else that is going on. She plans on getting hydrocortisone cream to use on the treated area.   PHYSICAL EXAMINATION: vitals were not taken for this visit.     Pronounced dermatitis in the upper inner aspect of the treated area.  ASSESSMENT: The patient is doing satisfactorily with treatment.  PLAN: We will continue with the patient's radiation treatment as planned. She received an ativan prescription today.      ------------------------------------------------  Jodelle Gross, MD, PhD  This document serves as a record of services personally performed by Kyung Rudd, MD. It was created on his behalf by Arlyce Harman, a trained medical scribe. The creation of this record is based on the scribe's personal observations and the provider's statements to them. This document has been checked and approved by the attending provider.

## 2016-06-05 NOTE — Telephone Encounter (Signed)
soke with pt to confirm iron infusion appts 7/14 per GM pof

## 2016-06-05 NOTE — Progress Notes (Signed)
[name] received Ativan 0.5mg  po tab at 0845a as ordered by Dr. Lisbeth Renshaw prior to her treatment today on [place] 3. Will pick up her new prescription today at [facility] on [address].

## 2016-06-05 NOTE — Telephone Encounter (Addendum)
Prescription for Ativan 0.5 mg tabs, 1 po every 8 hours called to [facility] at [address] by [name], PA as requested by Dr. Kyung Rudd.  Patient informed.  Her original script for the above medication was sent to [facility].  Ms. Koffel stated she know longer is seen by this agency.

## 2016-06-06 ENCOUNTER — Inpatient Hospital Stay
Admission: RE | Admit: 2016-06-06 | Discharge: 2016-06-06 | Disposition: A | Discharge status: Home / Self Care | Payer: Self-pay | Source: Ambulatory Visit | Attending: Radiation Oncology | Admitting: Radiation Oncology

## 2016-06-06 ENCOUNTER — Other Ambulatory Visit: Payer: Self-pay | Admitting: Radiation Oncology

## 2016-06-06 DIAGNOSIS — C801 Malignant (primary) neoplasm, unspecified: Secondary | ICD-10-CM

## 2016-06-08 ENCOUNTER — Ambulatory Visit
Admission: RE | Admit: 2016-06-08 | Discharge: 2016-06-08 | Disposition: A | Discharge status: Home / Self Care | Payer: Medicaid Other | Source: Ambulatory Visit | Attending: Radiation Oncology | Admitting: Radiation Oncology

## 2016-06-08 ENCOUNTER — Ambulatory Visit: Payer: Medicaid Other | Admitting: Radiation Oncology

## 2016-06-08 DIAGNOSIS — Z510 Encounter for antineoplastic radiation therapy: Secondary | ICD-10-CM | POA: Diagnosis not present

## 2016-06-09 ENCOUNTER — Inpatient Hospital Stay
Admission: RE | Admit: 2016-06-09 | Discharge: 2016-06-09 | Disposition: A | Discharge status: Home / Self Care | Payer: Self-pay | Source: Ambulatory Visit | Attending: Radiation Oncology | Admitting: Radiation Oncology

## 2016-06-09 ENCOUNTER — Other Ambulatory Visit: Payer: Self-pay | Admitting: Radiation Oncology

## 2016-06-09 ENCOUNTER — Ambulatory Visit
Admission: RE | Admit: 2016-06-09 | Discharge: 2016-06-09 | Disposition: A | Discharge status: Home / Self Care | Payer: Medicaid Other | Source: Ambulatory Visit | Attending: Radiation Oncology | Admitting: Radiation Oncology

## 2016-06-09 DIAGNOSIS — C801 Malignant (primary) neoplasm, unspecified: Secondary | ICD-10-CM

## 2016-06-09 DIAGNOSIS — Z510 Encounter for antineoplastic radiation therapy: Secondary | ICD-10-CM | POA: Diagnosis not present

## 2016-06-10 ENCOUNTER — Ambulatory Visit
Admission: RE | Admit: 2016-06-10 | Discharge: 2016-06-10 | Disposition: A | Discharge status: Home / Self Care | Payer: Medicaid Other | Source: Ambulatory Visit | Attending: Radiation Oncology | Admitting: Radiation Oncology

## 2016-06-10 DIAGNOSIS — Z510 Encounter for antineoplastic radiation therapy: Secondary | ICD-10-CM | POA: Diagnosis not present

## 2016-06-11 ENCOUNTER — Other Ambulatory Visit: Payer: Self-pay | Admitting: Oncology

## 2016-06-11 ENCOUNTER — Ambulatory Visit
Admission: RE | Admit: 2016-06-11 | Discharge: 2016-06-11 | Disposition: A | Discharge status: Home / Self Care | Payer: Medicaid Other | Source: Ambulatory Visit | Attending: Radiation Oncology | Admitting: Radiation Oncology

## 2016-06-11 DIAGNOSIS — Z510 Encounter for antineoplastic radiation therapy: Secondary | ICD-10-CM | POA: Diagnosis not present

## 2016-06-11 DIAGNOSIS — C50312 Malignant neoplasm of lower-inner quadrant of left female breast: Secondary | ICD-10-CM

## 2016-06-12 ENCOUNTER — Ambulatory Visit
Admission: RE | Admit: 2016-06-12 | Discharge: 2016-06-12 | Disposition: A | Discharge status: Home / Self Care | Payer: Medicaid Other | Source: Ambulatory Visit | Attending: Radiation Oncology | Admitting: Radiation Oncology

## 2016-06-12 ENCOUNTER — Ambulatory Visit (HOSPITAL_BASED_OUTPATIENT_CLINIC_OR_DEPARTMENT_OTHER): Payer: Medicaid Other

## 2016-06-12 ENCOUNTER — Encounter: Payer: Self-pay | Admitting: Radiation Oncology

## 2016-06-12 ENCOUNTER — Telehealth: Payer: Self-pay

## 2016-06-12 ENCOUNTER — Other Ambulatory Visit: Payer: Self-pay | Admitting: Oncology

## 2016-06-12 VITALS — BP 118/55 | HR 76 | Temp 97.4°F | Resp 16

## 2016-06-12 VITALS — BP 110/59 | HR 89 | Temp 97.4°F | Resp 20 | Wt 156.8 lb

## 2016-06-12 DIAGNOSIS — C50312 Malignant neoplasm of lower-inner quadrant of left female breast: Secondary | ICD-10-CM

## 2016-06-12 DIAGNOSIS — R110 Nausea: Secondary | ICD-10-CM

## 2016-06-12 DIAGNOSIS — F411 Generalized anxiety disorder: Secondary | ICD-10-CM

## 2016-06-12 DIAGNOSIS — D649 Anemia, unspecified: Secondary | ICD-10-CM

## 2016-06-12 DIAGNOSIS — Z510 Encounter for antineoplastic radiation therapy: Secondary | ICD-10-CM | POA: Diagnosis not present

## 2016-06-12 DIAGNOSIS — C7951 Secondary malignant neoplasm of bone: Secondary | ICD-10-CM

## 2016-06-12 MED ORDER — DIPHENOXYLATE-ATROPINE 2.5-0.025 MG PO TABS: 2.0000 | ORAL_TABLET | Freq: Four times a day (QID) | ORAL | Status: DC | PRN

## 2016-06-12 MED ORDER — SODIUM CHLORIDE 0.9% FLUSH
10.0000 mL | INTRAVENOUS | Status: AC | PRN
  Administered 2016-06-12: 10 mL
  Filled 2016-06-12: qty 10

## 2016-06-12 MED ORDER — LIDOCAINE-PRILOCAINE 2.5-2.5 % EX CREA: 1.0000 "application " | TOPICAL_CREAM | CUTANEOUS | Status: DC | PRN

## 2016-06-12 MED ORDER — TIZANIDINE HCL 4 MG PO TABS: 4.0000 mg | ORAL_TABLET | Freq: Every day | ORAL | Status: DC

## 2016-06-12 MED ORDER — SODIUM CHLORIDE 0.9 % IV SOLN
510.0000 mg | Freq: Once | INTRAVENOUS | Status: AC
  Administered 2016-06-12: 510 mg via INTRAVENOUS
  Filled 2016-06-12: qty 17

## 2016-06-12 MED ORDER — HEPARIN SOD (PORK) LOCK FLUSH 100 UNIT/ML IV SOLN
500.0000 [IU] | INTRAVENOUS | Status: AC | PRN
  Administered 2016-06-12: 500 [IU]
  Filled 2016-06-12: qty 5

## 2016-06-12 MED ORDER — TRAMADOL HCL 50 MG PO TABS: 50.0000 mg | ORAL_TABLET | Freq: Four times a day (QID) | ORAL | Status: DC | PRN

## 2016-06-12 MED ORDER — PANTOPRAZOLE SODIUM 40 MG PO TBEC: 40.0000 mg | DELAYED_RELEASE_TABLET | Freq: Every day | ORAL | Status: DC

## 2016-06-12 NOTE — Patient Instructions (Signed)

## 2016-06-12 NOTE — Progress Notes (Signed)
Weekly rad tx left breast 8/18 completed, using radiaplex bid-tid, wants lomotil dor diarrhea, and tramadol and xanflex rx refill she is out , erythema, and a couple areas has peeled, using neosporin with pain relief and hydrogel pads states she is eating  Out every day junk foods till she can gt her food stamps, will give her some of our food from the food bank 8:39 AM BP 110/59 mmHg  Pulse 89  Temp(Src) 97.4 F (36.3 C) (Oral)  Resp 20  Wt 156 lb 12.8 oz (71.124 kg)  Wt Readings from Last 3 Encounters:  06/12/16 156 lb 12.8 oz (71.124 kg)  06/03/16 157 lb 6.4 oz (71.396 kg)  06/01/16 159 lb 1.6 oz (72.167 kg)

## 2016-06-12 NOTE — Telephone Encounter (Signed)
-----   Message from Ma Hillock, RN sent at 06/12/2016  9:48 AM EDT ----- Pt would like like Emla cream to be prescribed for her port. She would also like for her Protonix to be refilled by Dr. [name] since she has not established a PCP here yet.  When she comes in for her appointment on 7/20 she needs some education on her treatment cycles.

## 2016-06-13 NOTE — Progress Notes (Signed)
Department of Radiation Oncology  Phone:  [phone] Fax:        [phone]  Weekly Treatment Note    Name: [name] Date: 06/13/2016 MRN: [id number] DOB: 12-Nov-1961   Diagnosis:     ICD-9-CM ICD-10-CM   1. 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 cetirizine (ZYRTEC) 10 MG tablet Take 1 tablet (10 mg total) by mouth daily. 30 tablet 11  . fluticasone (FLONASE) 50 MCG/ACT nasal spray Place 1 spray into the nose daily.    [name] gabapentin (NEURONTIN) 300 MG capsule Take 2 capsules (600 mg total) by mouth 3 (three) times daily. 300 mg at night for one day, twice daily for one day, then three times daily 180 capsule 2  . hyaluronate sodium (RADIAPLEXRX) GEL Apply 1 application topically.    . lamoTRIgine (LAMICTAL) 100 MG tablet Take 150 mg by mouth 2 (two) times daily.     [name] lithium carbonate 300 MG capsule Take 300 mg by mouth 2 (two) times daily with a meal.    . LORazepam (ATIVAN) 0.5 MG tablet Take 1 tablet (0.5 mg total) by mouth every 8 (eight) hours. 30 tablet 0  . non-metallic deodorant (ALRA) MISC Apply 1 application topically daily as needed.    . ondansetron (ZOFRAN) 8 MG tablet Take 1 tablet (8 mg total) by mouth every 8 (eight) hours as needed for nausea or vomiting. 30 tablet 1  . polyethylene glycol powder (GLYCOLAX/MIRALAX) powder Take 17 g by mouth 2 (two) times daily as needed for mild constipation. 3350 g 3  . potassium chloride (K-DUR) 10 MEQ tablet Take 10 mEq by mouth daily.    . pregabalin (LYRICA) 75 MG capsule Take 1 capsule (75 mg total) by mouth 2 (two) times daily. 60 capsule 3  . prochlorperazine (COMPAZINE) 10 MG tablet Take 10 mg by mouth daily as needed.    [name] tiZANidine (ZANAFLEX) 4 MG tablet Take 1 tablet (4 mg total) by mouth daily. 30 tablet 0  . traMADol (ULTRAM) 50 MG  tablet Take 1 tablet (50 mg total) by mouth every 6 (six) hours as needed. 60 tablet 0  . triamcinolone cream (KENALOG) 0.5 % Apply 1 application topically daily as needed.  1  . trolamine salicylate (ASPERCREME) 10 % cream Apply 1 application topically as needed for muscle pain. Reported on 06/01/2016    . Vitamin D, Cholecalciferol, 1000 units CAPS Take 1,000 Units by mouth daily.    . diphenoxylate-atropine (LOMOTIL) 2.5-0.025 MG tablet Take 2 tablets by mouth 4 (four) times daily as needed for diarrhea or loose stools. 40 tablet 0  . lidocaine-prilocaine (EMLA) cream Apply 1 application topically as needed. 30 g 0  . pantoprazole (PROTONIX) 40 MG tablet Take 1 tablet (40 mg total) by mouth daily. 30 tablet 3   No current facility-administered medications for this encounter.     ALLERGIES: Demerol and Erythromycin   LABORATORY DATA:  Lab Results  Component Value Date   WBC 5.9 06/03/2016   HGB 11.3* 06/03/2016   HCT 34.7* 06/03/2016   MCV 83.2 06/03/2016   PLT 201 06/03/2016   Lab Results  Component Value Date   NA 141 06/03/2016   K 4.0 06/03/2016   CL 106 02/28/2015   CO2 26 06/03/2016   Lab Results  Component Value Date  ALT 14 06/03/2016   AST 19 06/03/2016   ALKPHOS 48 06/03/2016   BILITOT <0.30 06/03/2016     NARRATIVE: [name] was seen today for weekly treatment management. The chart was checked and the patient's films were reviewed.  Weekly rad tx left breast 8/18 completed, using radiaplex bid-tid, wants lomotil dor diarrhea, and tramadol and xanflex rx refill she is out , erythema, and a couple areas has peeled, using neosporin with pain relief and hydrogel pads states she is eating  Out every day junk foods till she can gt her food stamps, will give her some of our food from the food bank 8:50 PM BP 110/59 mmHg  Pulse 89  Temp(Src) 97.4 F (36.3 C) (Oral)  Resp 20  Wt 156 lb 12.8 oz (71.124 kg)  Wt Readings from Last 3 Encounters:  06/12/16 156 lb  12.8 oz (71.124 kg)  06/03/16 157 lb 6.4 oz (71.396 kg)  06/01/16 159 lb 1.6 oz (72.167 kg)    PHYSICAL EXAMINATION: weight is 156 lb 12.8 oz (71.124 kg). Her oral temperature is 97.4 F (36.3 C). Her blood pressure is 110/59 and her pulse is 89. Her respiration is 20.        ASSESSMENT: The patient is doing satisfactorily with treatment.  PLAN: We will continue with the patient's radiation treatment as planned. The patient is doing satisfactorily at this time with the continuation of her treatment. Moderate skin changes currently will continue to be followed.

## 2016-06-15 ENCOUNTER — Ambulatory Visit
Admission: RE | Admit: 2016-06-15 | Discharge: 2016-06-15 | Disposition: A | Discharge status: Home / Self Care | Payer: Medicaid Other | Source: Ambulatory Visit | Attending: Radiation Oncology | Admitting: Radiation Oncology

## 2016-06-15 ENCOUNTER — Ambulatory Visit: Payer: Medicaid Other | Admitting: Radiation Oncology

## 2016-06-15 DIAGNOSIS — Z510 Encounter for antineoplastic radiation therapy: Secondary | ICD-10-CM | POA: Diagnosis not present

## 2016-06-16 ENCOUNTER — Other Ambulatory Visit: Payer: Self-pay | Admitting: *Deleted

## 2016-06-16 ENCOUNTER — Telehealth: Payer: Self-pay | Admitting: *Deleted

## 2016-06-16 ENCOUNTER — Ambulatory Visit
Admission: RE | Admit: 2016-06-16 | Discharge: 2016-06-16 | Disposition: A | Discharge status: Home / Self Care | Payer: Medicaid Other | Source: Ambulatory Visit | Attending: Radiation Oncology | Admitting: Radiation Oncology

## 2016-06-16 ENCOUNTER — Ambulatory Visit (HOSPITAL_COMMUNITY): Payer: Self-pay

## 2016-06-16 ENCOUNTER — Ambulatory Visit (HOSPITAL_COMMUNITY): Admission: RE | Admit: 2016-06-16 | Payer: Medicaid Other | Source: Ambulatory Visit

## 2016-06-16 DIAGNOSIS — Z510 Encounter for antineoplastic radiation therapy: Secondary | ICD-10-CM | POA: Diagnosis not present

## 2016-06-16 DIAGNOSIS — R110 Nausea: Secondary | ICD-10-CM

## 2016-06-16 DIAGNOSIS — C50312 Malignant neoplasm of lower-inner quadrant of left female breast: Secondary | ICD-10-CM

## 2016-06-16 DIAGNOSIS — C7951 Secondary malignant neoplasm of bone: Secondary | ICD-10-CM

## 2016-06-16 MED ORDER — PANTOPRAZOLE SODIUM 40 MG PO TBEC: 40.0000 mg | DELAYED_RELEASE_TABLET | Freq: Every day | ORAL | Status: DC

## 2016-06-16 MED ORDER — LIDOCAINE-PRILOCAINE 2.5-2.5 % EX CREA: 1.0000 "application " | TOPICAL_CREAM | CUTANEOUS | Status: DC | PRN

## 2016-06-16 NOTE — Telephone Encounter (Signed)
That would be fine, Nira Conn, can you reschedule? Thanks.

## 2016-06-16 NOTE — Telephone Encounter (Signed)
Echo and appt resch for 8/2, pt aware

## 2016-06-16 NOTE — Telephone Encounter (Signed)
This RN spoke with pt per her call stating " I need to reschedule the ECHO appointment scheduled for today " - " the area that is getting radiated is very sore and I cannot lay on the table for them to do the ECHO "  Hisako stated she has 2 new areas that are  with desquamation - she states radiation is aware and " giving me appropriate creams  "   Per request this RN requested ECHO to be rescheduled to first week in August.  This note will be sent to MD - including Dr Aundra Dubin.

## 2016-06-17 ENCOUNTER — Ambulatory Visit (HOSPITAL_COMMUNITY)
Admission: RE | Admit: 2016-06-17 | Discharge: 2016-06-17 | Disposition: A | Discharge status: Home / Self Care | Payer: Medicaid Other | Source: Ambulatory Visit | Attending: Oncology | Admitting: Oncology

## 2016-06-17 ENCOUNTER — Encounter: Payer: Self-pay | Admitting: *Deleted

## 2016-06-17 ENCOUNTER — Ambulatory Visit
Admission: RE | Admit: 2016-06-17 | Discharge: 2016-06-17 | Disposition: A | Discharge status: Home / Self Care | Payer: Medicaid Other | Source: Ambulatory Visit | Attending: Radiation Oncology | Admitting: Radiation Oncology

## 2016-06-17 ENCOUNTER — Ambulatory Visit: Payer: Medicaid Other | Admitting: Radiation Oncology

## 2016-06-17 DIAGNOSIS — Z510 Encounter for antineoplastic radiation therapy: Secondary | ICD-10-CM | POA: Diagnosis not present

## 2016-06-17 DIAGNOSIS — R918 Other nonspecific abnormal finding of lung field: Secondary | ICD-10-CM | POA: Insufficient documentation

## 2016-06-17 DIAGNOSIS — C50312 Malignant neoplasm of lower-inner quadrant of left female breast: Secondary | ICD-10-CM | POA: Diagnosis present

## 2016-06-17 MED ORDER — IOPAMIDOL (ISOVUE-300) INJECTION 61%
75.0000 mL | Freq: Once | INTRAVENOUS | Status: AC | PRN
  Administered 2016-06-17: 75 mL via INTRAVENOUS

## 2016-06-17 NOTE — Progress Notes (Signed)
[facility] Psychosocial Distress Screening Clinical Social Work  Clinical Social Work was referred by distress screening protocol.  The patient scored a 6 on the Psychosocial Distress Thermometer which indicates moderate distress. Clinical Social Worker phoned pt to assess for distress and other psychosocial needs. CSW left vm for pt introducing CSW and briefly explaining role of CSW. CSW left contact information and encouraged pt to return CSW call.   ON[facility] DISTRESS SCREENING 06/01/2016  Screening Type Initial Screening  Distress experienced in past week (1-10) 6  Practical problem type Transportation  Emotional problem type Depression;Nervousness/Anxiety;Adjusting to illness  Spiritual/Religous concerns type Relating to God  Physical Problem type Sleep/insomnia;Constipation/diarrhea  Physician notified of physical symptoms Yes  Referral to clinical social work Yes    Clinical Social Worker follow up needed: Yes.    If yes, follow up plan:  CSW will attempt to follow up at treatment Loren Racer, [place]  [facility] Phone: [phone] Fax: [phone]

## 2016-06-18 ENCOUNTER — Encounter: Payer: Self-pay | Admitting: Oncology

## 2016-06-18 ENCOUNTER — Encounter: Payer: Self-pay | Admitting: *Deleted

## 2016-06-18 ENCOUNTER — Ambulatory Visit: Payer: Medicaid Other

## 2016-06-18 ENCOUNTER — Ambulatory Visit (HOSPITAL_BASED_OUTPATIENT_CLINIC_OR_DEPARTMENT_OTHER): Payer: Medicaid Other | Admitting: Oncology

## 2016-06-18 ENCOUNTER — Ambulatory Visit (HOSPITAL_BASED_OUTPATIENT_CLINIC_OR_DEPARTMENT_OTHER): Payer: Medicaid Other

## 2016-06-18 ENCOUNTER — Ambulatory Visit
Admission: RE | Admit: 2016-06-18 | Discharge: 2016-06-18 | Disposition: A | Discharge status: Home / Self Care | Payer: Medicaid Other | Source: Ambulatory Visit | Attending: Radiation Oncology | Admitting: Radiation Oncology

## 2016-06-18 ENCOUNTER — Telehealth: Payer: Self-pay | Admitting: Medical Oncology

## 2016-06-18 VITALS — BP 137/68 | HR 90 | Temp 98.2°F | Resp 18 | Ht 65.5 in | Wt 156.9 lb

## 2016-06-18 DIAGNOSIS — Z5112 Encounter for antineoplastic immunotherapy: Secondary | ICD-10-CM

## 2016-06-18 DIAGNOSIS — C7951 Secondary malignant neoplasm of bone: Secondary | ICD-10-CM

## 2016-06-18 DIAGNOSIS — Z510 Encounter for antineoplastic radiation therapy: Secondary | ICD-10-CM | POA: Diagnosis not present

## 2016-06-18 DIAGNOSIS — C773 Secondary and unspecified malignant neoplasm of axilla and upper limb lymph nodes: Secondary | ICD-10-CM

## 2016-06-18 DIAGNOSIS — D509 Iron deficiency anemia, unspecified: Secondary | ICD-10-CM | POA: Diagnosis not present

## 2016-06-18 DIAGNOSIS — C50312 Malignant neoplasm of lower-inner quadrant of left female breast: Secondary | ICD-10-CM | POA: Diagnosis present

## 2016-06-18 DIAGNOSIS — F411 Generalized anxiety disorder: Secondary | ICD-10-CM

## 2016-06-18 DIAGNOSIS — F17210 Nicotine dependence, cigarettes, uncomplicated: Secondary | ICD-10-CM

## 2016-06-18 DIAGNOSIS — R110 Nausea: Secondary | ICD-10-CM

## 2016-06-18 MED ORDER — FERUMOXYTOL INJECTION 510 MG/17 ML
510.0000 mg | Freq: Once | INTRAVENOUS | Status: AC
  Administered 2016-06-18: 510 mg via INTRAVENOUS
  Filled 2016-06-18: qty 17

## 2016-06-18 MED ORDER — DENOSUMAB 120 MG/1.7ML ~~LOC~~ SOLN
120.0000 mg | Freq: Once | SUBCUTANEOUS | Status: AC
  Administered 2016-06-18: 120 mg via SUBCUTANEOUS
  Filled 2016-06-18: qty 1.7

## 2016-06-18 MED ORDER — LIDOCAINE-PRILOCAINE 2.5-2.5 % EX CREA: 1.0000 "application " | TOPICAL_CREAM | CUTANEOUS | Status: DC | PRN

## 2016-06-18 MED ORDER — DIPHENHYDRAMINE HCL 25 MG PO CAPS
ORAL_CAPSULE | ORAL | Status: AC
  Filled 2016-06-18: qty 1

## 2016-06-18 MED ORDER — SODIUM CHLORIDE 0.9 % IV SOLN
420.0000 mg | Freq: Once | INTRAVENOUS | Status: AC
  Administered 2016-06-18: 420 mg via INTRAVENOUS
  Filled 2016-06-18: qty 14

## 2016-06-18 MED ORDER — PANTOPRAZOLE SODIUM 40 MG PO TBEC: 40.0000 mg | DELAYED_RELEASE_TABLET | Freq: Every day | ORAL | Status: DC

## 2016-06-18 MED ORDER — ONDANSETRON HCL 8 MG PO TABS: 8.0000 mg | ORAL_TABLET | Freq: Three times a day (TID) | ORAL | Status: DC | PRN

## 2016-06-18 MED ORDER — ACETAMINOPHEN 500 MG PO TABS
ORAL_TABLET | ORAL | Status: AC
  Filled 2016-06-18: qty 2

## 2016-06-18 MED ORDER — TRASTUZUMAB CHEMO 150 MG IV SOLR
6.0000 mg/kg | Freq: Once | INTRAVENOUS | Status: AC
  Administered 2016-06-18: 420 mg via INTRAVENOUS
  Filled 2016-06-18: qty 20

## 2016-06-18 MED ORDER — SODIUM CHLORIDE 0.9 % IV SOLN
Freq: Once | INTRAVENOUS | Status: AC
  Administered 2016-06-18: 10:00:00 via INTRAVENOUS

## 2016-06-18 MED ORDER — HEPARIN SOD (PORK) LOCK FLUSH 100 UNIT/ML IV SOLN
500.0000 [IU] | Freq: Once | INTRAVENOUS | Status: AC | PRN
  Administered 2016-06-18: 500 [IU]
  Filled 2016-06-18: qty 5

## 2016-06-18 MED ORDER — DIPHENHYDRAMINE HCL 25 MG PO CAPS: 25.0000 mg | ORAL_CAPSULE | Freq: Once | ORAL | Status: DC

## 2016-06-18 MED ORDER — ACETAMINOPHEN 325 MG PO TABS: 650.0000 mg | ORAL_TABLET | Freq: Once | ORAL | Status: DC

## 2016-06-18 MED ORDER — SODIUM CHLORIDE 0.9% FLUSH
10.0000 mL | INTRAVENOUS | Status: DC | PRN
  Administered 2016-06-18: 10 mL
  Filled 2016-06-18: qty 10

## 2016-06-18 MED ORDER — VARENICLINE TARTRATE 0.5 MG PO TABS: 0.5000 mg | ORAL_TABLET | Freq: Every day | ORAL | Status: DC

## 2016-06-18 NOTE — Progress Notes (Signed)
Xgeva to be given in infusion room.

## 2016-06-18 NOTE — Telephone Encounter (Signed)
Received givne to infuison RN , Stanton Kidney to scan.

## 2016-06-18 NOTE — Telephone Encounter (Signed)
Requested surgical report of VAD

## 2016-06-18 NOTE — Progress Notes (Signed)
Received [employer] ending letter 06/29/16. Not sure who dropped it off. Went to treatment area to meet with patient to have her follow up with Altha Harm in [facility] or caseworker listed on letter. Also was going to give patient a new Medicaid application as well as a [place] [employer] if needed. Will put in note for patient to see me at next visit to receive this information. Attempted to reach patient via telephone but was unsuccessful.

## 2016-06-18 NOTE — Patient Instructions (Addendum)
[facility] Discharge Instructions for Patients Receiving Chemotherapy  Today you received the following chemotherapy agents Herceptin and Perjeta, Xgeva, Iron  To help prevent nausea and vomiting after your treatment, we encourage you to take your nausea medication as prescribed.   If you develop nausea and vomiting that is not controlled by your nausea medication, call the clinic.   BELOW ARE SYMPTOMS THAT SHOULD BE REPORTED IMMEDIATELY:  *FEVER GREATER THAN 100.5 F  *CHILLS WITH OR WITHOUT FEVER  NAUSEA AND VOMITING THAT IS NOT CONTROLLED WITH YOUR NAUSEA MEDICATION  *UNUSUAL SHORTNESS OF BREATH  *UNUSUAL BRUISING OR BLEEDING  TENDERNESS IN MOUTH AND THROAT WITH OR WITHOUT PRESENCE OF ULCERS  *URINARY PROBLEMS  *BOWEL PROBLEMS  UNUSUAL RASH Items with * indicate a potential emergency and should be followed up as soon as possible.  Feel free to call the clinic you have any questions or concerns. The clinic phone number is [phone].  Please show the [place] at check-in to the Emergency Department and triage nurse.

## 2016-06-18 NOTE — Progress Notes (Signed)
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 By then she had developed left breast and left axillary swelling (June 2016). She presented to the local emergency room and had a chest CT scan 06/06/2015 which showed a nodule in the left breast measuring 0.9 cm and questionable left axillary adenopathy. She then proceeded to bilateral diagnostic mammography and left breast ultrasonography 06/19/2015. There were no prior films for comparison (last mammography 12 years prior).. The breast density was category C. Mammography showed in the left breast upper inner quadrant a 7 cm area including a small mass and significant pleomorphic calcifications. Ultrasonography defined the mass as measuring 1.2 cm. The left axilla appeared unremarkable. There was significant skin edema.  Biopsy of the left breast mass 06/19/2015 showed (SP [id number]) an invasive ductal carcinoma, grade 2, estrogen receptor 83% positive, progesterone receptor 26% positive, and HER-2 amplified by immunohistochemstry with a 3+ reading. The patient had biopsies of a separate area in the left breast August of the same year and this showed atypical ductal hyperplasia. (SP [id number]).  Accordingly after appropriate discussion on 08/21/2015 the patient proceeded to left mastectomy with  left axillary sentinel lymph node sampling, which, since the lymph nodes were positive, extended to the procedure to left axillary lymph node dissection. The pathology (SP [id number]) showed an invasive ductal carcinoma, grade 3, measuring in excess of 9 cm. There were also skin satellites, not contiguous with the invasive carcinoma. Margins were clear and ample. There was evidence of lymphovascular invasion. A total of 15 lymph nodes were removed, including 5 sentinel lymph nodes, all of which were positive, so that the final total was 14 out of 15 lymph nodes involved by tumor. There was evidence of extranodal extension. The final pathology was pT4b pN2a, stage IIIB  CA-27-29 and CEA 09/19/2015 were non-informative October 2016.  Unfortunately CT scans of the chest abdomen and pelvis 09/16/2015 showed bony metastases to the right scapula, left iliac crest, and also L4 and T-spine. There were questionable liver cysts which on repeat CT scan 03/02/2016 appear to be a little bit more well-defined, possibly a little larger. There were also some possible right upper lobe lung lesions.  Adjuvant treatment consisted of docetaxel, trastuzumab and pertuzumab, with the final (6th) docetaxel dose given 02/11/2016. She continues on trastuzumab and pertuzumab, with the 11th cycle given 05/05/2016. Echocardiogram 02/26/2016 showed an ejection fraction of 55%. She receives denosumab/Xgeva every 6 weeks.. She also receives radiation, started 06/09/201, to be completed 06/26/2016.  Her subsequent history is as detailed below  INTERVAL HISTORY: [name] returns today for follow-up of her metastatic breast cancer. She continues on radiation, with significant pain and desquamation. Because of this she was not able to undergo an echocardiogram and this has been rescheduled.  She did have a chest CT scan yesterday, and this shows no evidence of cancer in her lungs or liver, and no  aggressive lytic or sclerotic bone lesions.  Bone scan is pending next week. The CT of the abdomen and pelvis was not approved.  She underwent Feraheme last week with no side effects.  REVIEW OF SYSTEMS: She continues depressed and anxious. Her living situation right now is suboptimal. She feels she has absolutely no support here and 10 but does not want to or cannot go back to [place] where she was before. She denies unusual headaches, visual changes, nausea, vomiting, dizziness, or gait imbalance. She denies cough or phlegm production. A detailed review of systems today was otherwise stable.  PAST MEDICAL HISTORY: Past Medical History  Diagnosis Date  . Depression   . PTSD (post-traumatic stress disorder)   . Alcohol abuse   . Chronic pain   . Opiate dependence ([place])   . Bipolar disorder ([place])   . Anxiety     At age 55  . Arthritis Dx 2010  . Fibromyalgia Dx 2005    PAST SURGICAL HISTORY: Past Surgical History  Procedure Laterality Date  . Fibula fracture surgery      FAMILY HISTORY Family History  Problem Relation Age of Onset  . Diabetes Mother   The patient's father still living, age 53, in [place]. He had prostate cancer at some point in the past. The patient's mother died at age 46 from complications of diabetes. The patient had no brothers, 2 sisters. A paternal grandmother had lung cancer in the setting of tobacco abuse. There is no other history of cancer in the family to her knowledge  GYNECOLOGIC HISTORY:  No LMP recorded. Patient has had an ablation. Menarche approximately age 2. First live birth in 69. The patient is GX P2. She underwent endometrial ablation in 2016.  SOCIAL HISTORY:  The patient is originally from [place]. She has lived in [place] before but more recently was in [place]. She is back here because she could not afford her rent in [place]. She is living here and a temporary situation. She has divorced. HER-2 children are [name] who lives in [place] and works as a  Development worker, community, and [name] who also lives in [place] and works as a Catering manager. The patient has a grandchild, 86 years old as of July 2017, living in [place] with his mother. The patient has not established herself with a local church yet.    ADVANCED DIRECTIVES: Not in place; at the 06/03/2016 visit the patient was given the appropriate forms to complete and notarize at her discretion   HEALTH MAINTENANCE: Social History  Substance Use Topics  . Smoking status: Current Every Day Smoker -- 1.00 packs/day    Types: Cigarettes  . Smokeless tobacco: Never Used  . Alcohol Use: Yes     Comment: no ETOH since May     Colonoscopy:  PAP:  Bone density:   Allergies  Allergen Reactions  . Demerol Itching and Nausea And Vomiting  . Erythromycin Rash    Can take zpak    Current Outpatient Prescriptions  Medication Sig Dispense Refill  . cetirizine (ZYRTEC) 10 MG tablet Take 1 tablet (10 mg total) by mouth daily. 30 tablet 11  . diphenoxylate-atropine (LOMOTIL) 2.5-0.025 MG tablet Take 2 tablets by mouth 4 (four) times daily as needed for diarrhea or loose stools. 40 tablet 0  . fluticasone (FLONASE) 50 MCG/ACT nasal spray Place 1 spray into the nose daily.    [name] gabapentin (NEURONTIN) 300 MG capsule Take 2 capsules (600 mg total) by mouth 3 (three) times daily. 300 mg at  night for one day, twice daily for one day, then three times daily 180 capsule 2  . hyaluronate sodium (RADIAPLEXRX) GEL Apply 1 application topically.    . lamoTRIgine (LAMICTAL) 100 MG tablet Take 150 mg by mouth 2 (two) times daily.     [name] lidocaine-prilocaine (EMLA) cream Apply 1 application topically as needed. 30 g 0  . lithium carbonate 300 MG capsule Take 300 mg by mouth 2 (two) times daily with a meal.    . LORazepam (ATIVAN) 0.5 MG tablet Take 1 tablet (0.5 mg total) by mouth every 8 (eight) hours. 30 tablet 0  . non-metallic deodorant (ALRA) MISC Apply 1 application topically daily as needed.    . ondansetron  (ZOFRAN) 8 MG tablet Take 1 tablet (8 mg total) by mouth every 8 (eight) hours as needed for nausea or vomiting. 30 tablet 1  . pantoprazole (PROTONIX) 40 MG tablet Take 1 tablet (40 mg total) by mouth daily. 30 tablet 3  . polyethylene glycol powder (GLYCOLAX/MIRALAX) powder Take 17 g by mouth 2 (two) times daily as needed for mild constipation. 3350 g 3  . potassium chloride (K-DUR) 10 MEQ tablet Take 10 mEq by mouth daily.    . pregabalin (LYRICA) 75 MG capsule Take 1 capsule (75 mg total) by mouth 2 (two) times daily. 60 capsule 3  . prochlorperazine (COMPAZINE) 10 MG tablet Take 10 mg by mouth daily as needed.    [name] tiZANidine (ZANAFLEX) 4 MG tablet Take 1 tablet (4 mg total) by mouth daily. 30 tablet 0  . traMADol (ULTRAM) 50 MG tablet Take 1 tablet (50 mg total) by mouth every 6 (six) hours as needed. 60 tablet 0  . triamcinolone cream (KENALOG) 0.5 % Apply 1 application topically daily as needed.  1  . trolamine salicylate (ASPERCREME) 10 % cream Apply 1 application topically as needed for muscle pain. Reported on 06/01/2016    . varenicline (CHANTIX) 0.5 MG tablet Take 1 tablet (0.5 mg total) by mouth daily. 30 tablet 3  . Vitamin D, Cholecalciferol, 1000 units CAPS Take 1,000 Units by mouth daily.     No current facility-administered medications for this visit.    OBJECTIVE: Middle-aged white woman In no acute distress Filed Vitals:   06/18/16 0842  BP: 137/68  Pulse: 90  Temp: 98.2 F (36.8 C)  Resp: 18     Body mass index is 25.7 kg/(m^2).    ECOG FS:1 - Symptomatic but completely ambulatory  Sclerae unicteric, pupils round and equal Oropharynx clear and moist-- no thrush or other lesions No cervical or supraclavicular adenopathy Lungs no rales or rhonchi Heart regular rate and rhythm Abd soft, nontender, positive bowel sounds MSK no focal spinal tenderness, no upper extremity lymphedema Neuro: nonfocal, well oriented, tearful and anxious affect Breasts: The right breast is  unremarkable. The left breast is currently undergoing radiation. There is significant desquamation anteriorly, in the inferior mammary fold, and in the axilla. There is no palpable mass.   LAB RESULTS:  CMP     Component Value Date/Time   NA 141 06/03/2016 1513   NA 144 02/28/2015 1007   K 4.0 06/03/2016 1513   K 4.9 02/28/2015 1007   CL 106 02/28/2015 1007   CO2 26 06/03/2016 1513   CO2 28 02/28/2015 1007   GLUCOSE 105 06/03/2016 1513   GLUCOSE 85 02/28/2015 1007   BUN 10.2 06/03/2016 1513   BUN 5* 02/28/2015 1007   CREATININE 0.9 06/03/2016 1513   CREATININE 0.89 02/28/2015  1007   CREATININE 1.15* 01/24/2015 0843   CALCIUM 9.4 06/03/2016 1513   CALCIUM 10.0 02/28/2015 1007   PROT 6.5 06/03/2016 1513   PROT 7.1 04/18/2013 1220   ALBUMIN 3.8 06/03/2016 1513   ALBUMIN 4.1 04/18/2013 1220   AST 19 06/03/2016 1513   AST 21 04/18/2013 1220   ALT 14 06/03/2016 1513   ALT 13 04/18/2013 1220   ALKPHOS 48 06/03/2016 1513   ALKPHOS 57 04/18/2013 1220   BILITOT <0.30 06/03/2016 1513   BILITOT 0.1* 04/18/2013 1220   GFRNONAA 53* 01/24/2015 0843   GFRAA 62* 01/24/2015 0843    INo results found for: SPEP, UPEP  Lab Results  Component Value Date   WBC 5.9 06/03/2016   NEUTROABS 4.3 06/03/2016   HGB 11.3* 06/03/2016   HCT 34.7* 06/03/2016   MCV 83.2 06/03/2016   PLT 201 06/03/2016      Chemistry      Component Value Date/Time   NA 141 06/03/2016 1513   NA 144 02/28/2015 1007   K 4.0 06/03/2016 1513   K 4.9 02/28/2015 1007   CL 106 02/28/2015 1007   CO2 26 06/03/2016 1513   CO2 28 02/28/2015 1007   BUN 10.2 06/03/2016 1513   BUN 5* 02/28/2015 1007   CREATININE 0.9 06/03/2016 1513   CREATININE 0.89 02/28/2015 1007   CREATININE 1.15* 01/24/2015 0843      Component Value Date/Time   CALCIUM 9.4 06/03/2016 1513   CALCIUM 10.0 02/28/2015 1007   ALKPHOS 48 06/03/2016 1513   ALKPHOS 57 04/18/2013 1220   AST 19 06/03/2016 1513   AST 21 04/18/2013 1220   ALT 14  06/03/2016 1513   ALT 13 04/18/2013 1220   BILITOT <0.30 06/03/2016 1513   BILITOT 0.1* 04/18/2013 1220       No results found for: LABCA2  No components found for: LABCA125  No results for input(s): INR in the last 168 hours.  Urinalysis    Component Value Date/Time   COLORURINE YELLOW 04/18/2013 [place] 04/18/2013 1222   LABSPEC 1.011 04/18/2013 1222   PHURINE 5.5 04/18/2013 1222   GLUCOSEU NEGATIVE 04/18/2013 1222   HGBUR TRACE* 04/18/2013 1222   BILIRUBINUR negative 03/28/2015 1321   BILIRUBINUR NEGATIVE 04/18/2013 1222   KETONESUR NEGATIVE 04/18/2013 1222   PROTEINUR negative 03/28/2015 1321   PROTEINUR NEGATIVE 04/18/2013 1222   UROBILINOGEN 0.2 03/28/2015 1321   UROBILINOGEN 0.2 04/18/2013 1222   NITRITE negative 03/28/2015 1321   NITRITE NEGATIVE 04/18/2013 1222   LEUKOCYTESUR Trace 03/28/2015 1321     STUDIES: Ct Chest W Contrast  06/17/2016  CLINICAL DATA:  History of left breast cancer. EXAM: CT CHEST WITH CONTRAST TECHNIQUE: Multidetector CT imaging of the chest was performed during intravenous contrast administration. CONTRAST:  85m ISOVUE-300 IOPAMIDOL (ISOVUE-300) INJECTION 61% COMPARISON:  03/29/2016 FINDINGS: Mediastinum/Lymph Nodes: There is a left breast prosthesis is in place. A right chest wall port a catheter is noted with tip in the cavoatrial junction. Normal heart size. Trachea appears patent. Normal appearance of the esophagus. No enlarged mediastinal or hilar lymph nodes. No axillary or supraclavicular adenopathy. Lungs/Pleura: No pleural effusion. Scar like density within the left lower lobe is identified image number 88 of series 6 appears unchanged the previous exam. Subsegmental atelectasis is noted the lung bases. No suspicious pulmonary nodule or mass. Upper abdomen: Scattered low-attenuation foci within the liver are identified. These were present on previous examination likely representing multiple small cysts but are too  small to  reliably characterize. The adrenal glands are normal. The visualized portions of the pancreas, spleen and kidneys are unremarkable. Musculoskeletal: No aggressive lytic or sclerotic bone lesions. IMPRESSION: 1. Stable CT of the chest. 2. No change left lower lobe scarring. No evidence to suggest residual or recurrent tumor. Electronically Signed   By: Kerby Moors M.D.   On: 06/17/2016 12:31     ELIGIBLE FOR AVAILABLE RESEARCH PROTOCOL: no  ASSESSMENT: 55 y.o. [place] woman with stage IV left-sided breast cancer involving bone, possibly also liver and lung   (1) s/p left breast lower inner quadrant biopsy 06/19/2015 for a clinical T2-3 NX invasive ductal carcinoma, grade 2, triple positive.  (2) status post left mastectomy and axillary lymph node dissection  with immediate expander placement 07/18/2015 for an mpT4 pN2,stage IIIB invasive ductal carcinoma, grade 3, with negative margins.  (a) definitive implant exchange to be scheduled in December   METASTATIC DISEASE: October 2016  (3) CT scan of the chest abdomen and pelvis  09/16/2015 shows metastatic lesions in the right scapula, left iliac crest, L4, and T spine. There were questionable liver cysts, with repeat CT scan 03/02/2016 showing possible right upper lobe lung lesions and possibly increased liver lesions  (a) CT scan of the chest shows no measurable disease in the lungs or liver  (b) Bone scan July 2017 pending  (4) received docetaxel every 3 weeks 6 together with trastuzumab and pertuzumab, last docetaxel dose 02/11/2016  (5) adjuvant radiation started 05/08/2016, to be completed 06/26/2016  (6) started trastuzumab and pertuzumab October 2016, continuing every 3 weeks,  (a) most recent echocardiogram 02/26/2016 showed a well preserved ejection fraction  (b) EP echocardiogram 07/01/2016 pending  (7) started the denosumab/Xgeva October 2017, repeated every 6 weeks  (8) to start anti-estrogen therapy at the  completion of radiation  (9) history of bipolar disorder  (a) lithium level checked every 6 weeks  (10) mild anemia with a significant drop in the MCV, ferritin 10 06/03/2016,   (a) Feraheme given 06/12/2016 and 06/18/2016  (11) tobacco abuse: Chantix started 06/18/2016  PLAN: Vianca is tolerating her treatment well, with essentially no side effects. That includes her anti-HER-2 treatments, her denosumab, and the Feraheme that she received last week and will receive again today.  We discussed her chest CT scan in detail. This is very favorable. She has a bone scan scheduled for next week.  She had been scheduled for an echocardiogram before today but was unable to do it because of significant desquamation from her radiation treatments. She is scheduled again the first week in August. I have urged her to make sure to keep that appointment.  She will have her next anti-HER-2 immunotherapy 07/07/2016 and she will receive that every 3 weeks indefinitely. She will have a lithium level checked every 6 weeks.  She feels very lost and without support. I am asking our navigators to give her a call.  She requested something to help her quit smoking. We are going to try Chantix, low dose. She is aware of the possible toxicities, side effects and complications of this medication NA she finds it causes significant mood changes she will call us. We are also updating some of her other medications today  At this point I feel Ardys is "on track" and barring any surprises from the upcoming bone scan the plan will be to continue the current treatment indefinitely on until there is evidence of disease progression.  She knows to call for any problems that may develop  before her next visit here.   Chauncey Cruel, MD   06/18/2016 8:51 AM Medical Oncology and Hematology [facility] [address] [place],  [address] Tel. [phone]    Fax. [phone]

## 2016-06-19 ENCOUNTER — Other Ambulatory Visit: Payer: Self-pay | Admitting: *Deleted

## 2016-06-19 ENCOUNTER — Ambulatory Visit: Payer: Self-pay

## 2016-06-19 ENCOUNTER — Ambulatory Visit
Admission: RE | Admit: 2016-06-19 | Discharge: 2016-06-19 | Disposition: A | Discharge status: Home / Self Care | Payer: Medicaid Other | Source: Ambulatory Visit | Attending: Radiation Oncology | Admitting: Radiation Oncology

## 2016-06-19 ENCOUNTER — Encounter: Payer: Self-pay | Admitting: Radiation Oncology

## 2016-06-19 DIAGNOSIS — C50312 Malignant neoplasm of lower-inner quadrant of left female breast: Secondary | ICD-10-CM

## 2016-06-19 DIAGNOSIS — R110 Nausea: Secondary | ICD-10-CM

## 2016-06-19 DIAGNOSIS — C7951 Secondary malignant neoplasm of bone: Secondary | ICD-10-CM

## 2016-06-19 DIAGNOSIS — Z510 Encounter for antineoplastic radiation therapy: Secondary | ICD-10-CM | POA: Diagnosis not present

## 2016-06-19 MED ORDER — LIDOCAINE-PRILOCAINE 2.5-2.5 % EX CREA: 1.0000 "application " | TOPICAL_CREAM | CUTANEOUS | Status: DC | PRN

## 2016-06-19 MED ORDER — VARENICLINE TARTRATE 0.5 MG PO TABS: 0.5000 mg | ORAL_TABLET | Freq: Every day | ORAL | Status: DC

## 2016-06-19 MED ORDER — PANTOPRAZOLE SODIUM 40 MG PO TBEC: 40.0000 mg | DELAYED_RELEASE_TABLET | Freq: Every day | ORAL | Status: DC

## 2016-06-19 MED ORDER — ONDANSETRON HCL 8 MG PO TABS: 8.0000 mg | ORAL_TABLET | Freq: Three times a day (TID) | ORAL | Status: DC | PRN

## 2016-06-19 NOTE — Progress Notes (Signed)
Patient seen in the back by MD not sent to nursing for assessment 8:33 AM

## 2016-06-20 NOTE — Progress Notes (Signed)
  Radiation Oncology         [phone] ________________________________  Name: [name] MRN: [id number]  Date: 06/01/2016  DOB: January 10, 1961  Diagnosis DIAGNOSIS:     ICD-9-CM ICD-10-CM   1. Primary cancer of lower-inner quadrant of left female breast ([facility] [place]) 174.3 C50.312      SIMULATION AND TREATMENT PLANNING NOTE  The patient presented for simulation prior to beginning her course of radiation treatment for her diagnosis of left-sided breast cancer. The patient was placed in a supine position on a breast board. A customized vac-lock bag was also constructed and this complex treatment device will be used on a daily basis during her treatment. In this fashion, a CT scan was obtained through the chest area and an isocenter was placed near the chest wall at the upper aspect of the right chest. A breath-hold technique has also been evaluated to determine if this significantly improves the spatial relationship between the target region and the heart. Based on this analysis, a breath-hold technique has not been ordered for the patient's treatment.  The patient will be planned to receive a course of radiation initially to a dose of 50.4 gray. This will consist of a 4 field technique targeting the left chest wall as well as the supraclavicular region. Therefore 2 customized medial and lateral tangent fields have been created targeting the chest wall, and also 2 additional customized fields have been designed to treat the supraclavicular region both with a left supraclavicular field and a left posterior axillary boost field. A forward planning/reduced field technique will also be evaluated to determine if this significantly improves the dose homogeneity of the overall plan. Therefore, additional customized blocks/fields may be necessary.  This initial treatment will be accomplished at 1.8 gray per fraction.   The initial plan will consist of a 3-D conformal technique. The target volume/scar, heart and  lungs have been contoured and dose volume histograms of each of these structures will be evaluated as part of the 3-D conformal treatment planning process.   It is anticipated that the patient will then receive a 10 gray boost to the surgical scar. This will be accomplished at 2 gray per fraction. The final anticipated total dose therefore will correspond to 60.4 gray.   The above doses represent the cumulative radiation treatment plan that the patient will receive. She has already received 27 gray to this region in [place]. We therefore will continue to a dose of 23.4 gray before beginning her 10 gray boost. We will begin this treatment immediately to minimize the break in her treatment.  _______________________________   Jodelle Gross, MD, PhD

## 2016-06-20 NOTE — Progress Notes (Signed)
  Radiation Oncology         [phone] ________________________________  Name: [name] MRN: [id number]  Date: 06/19/2016  DOB: 1961-03-30  Complex simulation note  The patient has undergone complex simulation for her upcoming boost treatment for her diagnosis of breast cancer. The patient has initially been planned to receive 50.4 Gy to the chest wall. The patient will now receive a 10 Gy boost to the mastectomy scar which has been identified. This will be accomplished using an en face electron field. Based on the depth of the target area,   6  MeV electrons will be used. The patient's final total dose therefore will be 60.4 Gy. A special port plan is requested for the boost treatment.   _______________________________  Jodelle Gross, MD, PhD

## 2016-06-20 NOTE — Progress Notes (Signed)
  Radiation Oncology         [phone]) [phone] ________________________________  Name: [name] MRN: [id number]  Date: 06/01/2016  DOB: [birth year]  Optical Surface Tracking Plan:  Since intensity modulated radiotherapy (IMRT) and 3D conformal radiation treatment methods are predicated on accurate and precise positioning for treatment, intrafraction motion monitoring is medically necessary to ensure accurate and safe treatment delivery.  The ability to quantify intrafraction motion without excessive ionizing radiation dose can only be performed with optical surface tracking. Accordingly, surface imaging offers the opportunity to obtain 3D measurements of patient position throughout IMRT and 3D treatments without excessive radiation exposure.  I am ordering optical surface tracking for this patient's upcoming course of radiotherapy. ________________________________  Kyung Rudd, MD 06/20/2016 1:34 PM    Reference:   Ursula Alert, J, et al. Surface imaging-based analysis of intrafraction motion for breast radiotherapy patients.Journal of [place], n. 6, nov. 2014. ISSN [id number].   Available at: <http://www.jacmp.org/index.php/jacmp/article/view/4957>.

## 2016-06-20 NOTE — Progress Notes (Signed)
Department of Radiation Oncology  Phone:  [phone] Fax:        [phone]  Weekly Treatment Note    Name: [name] Date: 06/20/2016 MRN: [id number] DOB: 1961/10/03   Diagnosis:     ICD-9-CM ICD-10-CM   1. Primary cancer of lower-inner quadrant of left female breast ([place]) 174.3 C50.312      Current dose: 23.4 Gy  Current fraction:13   MEDICATIONS: Current Outpatient Prescriptions  Medication Sig Dispense Refill  . cetirizine (ZYRTEC) 10 MG tablet Take 1 tablet (10 mg total) by mouth daily. 30 tablet 11  . diphenoxylate-atropine (LOMOTIL) 2.5-0.025 MG tablet Take 2 tablets by mouth 4 (four) times daily as needed for diarrhea or loose stools. 40 tablet 0  . fluticasone (FLONASE) 50 MCG/ACT nasal spray Place 1 spray into the nose daily.    [name] gabapentin (NEURONTIN) 300 MG capsule Take 2 capsules (600 mg total) by mouth 3 (three) times daily. 300 mg at night for one day, twice daily for one day, then three times daily 180 capsule 2  . hyaluronate sodium (RADIAPLEXRX) GEL Apply 1 application topically.    . lamoTRIgine (LAMICTAL) 100 MG tablet Take 150 mg by mouth 2 (two) times daily.     [name] lidocaine-prilocaine (EMLA) cream Apply 1 application topically as needed. 30 g 0  . lithium carbonate 300 MG capsule Take 300 mg by mouth 2 (two) times daily with a meal.    . LORazepam (ATIVAN) 0.5 MG tablet Take 1 tablet (0.5 mg total) by mouth every 8 (eight) hours. 30 tablet 0  . non-metallic deodorant (ALRA) MISC Apply 1 application topically daily as needed.    . ondansetron (ZOFRAN) 8 MG tablet Take 1 tablet (8 mg total) by mouth every 8 (eight) hours as needed for nausea or vomiting. 30 tablet 1  . pantoprazole (PROTONIX) 40 MG tablet Take 1 tablet (40 mg total) by mouth daily. 30 tablet 3  . polyethylene glycol powder (GLYCOLAX/MIRALAX) powder Take 17 g by mouth 2 (two) times daily as needed for mild constipation. 3350 g 3  . potassium chloride (K-DUR) 10 MEQ tablet Take 10  mEq by mouth daily.    . pregabalin (LYRICA) 75 MG capsule Take 1 capsule (75 mg total) by mouth 2 (two) times daily. 60 capsule 3  . prochlorperazine (COMPAZINE) 10 MG tablet Take 10 mg by mouth daily as needed.    [name] tiZANidine (ZANAFLEX) 4 MG tablet Take 1 tablet (4 mg total) by mouth daily. 30 tablet 0  . traMADol (ULTRAM) 50 MG tablet Take 1 tablet (50 mg total) by mouth every 6 (six) hours as needed. 60 tablet 0  . triamcinolone cream (KENALOG) 0.5 % Apply 1 application topically daily as needed.  1  . trolamine salicylate (ASPERCREME) 10 % cream Apply 1 application topically as needed for muscle pain. Reported on 06/01/2016    . varenicline (CHANTIX) 0.5 MG tablet Take 1 tablet (0.5 mg total) by mouth daily. 30 tablet 3  . Vitamin D, Cholecalciferol, 1000 units CAPS Take 1,000 Units by mouth daily.     No current facility-administered medications for this encounter.     ALLERGIES: Demerol and Erythromycin   LABORATORY DATA:  Lab Results  Component Value Date   WBC 5.9 06/03/2016   HGB 11.3* 06/03/2016   HCT 34.7* 06/03/2016   MCV 83.2 06/03/2016   PLT 201 06/03/2016   Lab Results  Component Value Date   NA 141 06/03/2016   K 4.0 06/03/2016  CL 106 02/28/2015   CO2 26 06/03/2016   Lab Results  Component Value Date   ALT 14 06/03/2016   AST 19 06/03/2016   ALKPHOS 48 06/03/2016   BILITOT <0.30 06/03/2016     NARRATIVE: [name] was seen today for weekly treatment management. The chart was checked and the patient's films were reviewed.  The patient is doing well. Her electron boost was set up today.   PHYSICAL EXAMINATION: vitals were not taken for this visit.     Increased dry desquamation to a significant degree in the upper inner aspect of the left breast as well as the axilla. The boost site shows no desquamation  ASSESSMENT: The patient is doing satisfactorily with treatment.  PLAN: We will continue with the patient's radiation treatment as planned.

## 2016-06-22 ENCOUNTER — Other Ambulatory Visit: Payer: Self-pay | Admitting: Radiation Oncology

## 2016-06-22 ENCOUNTER — Encounter (HOSPITAL_COMMUNITY): Payer: Medicaid Other

## 2016-06-22 ENCOUNTER — Telehealth: Payer: Self-pay

## 2016-06-22 ENCOUNTER — Ambulatory Visit
Admission: RE | Admit: 2016-06-22 | Discharge: 2016-06-22 | Disposition: A | Discharge status: Home / Self Care | Payer: Medicaid Other | Source: Ambulatory Visit | Attending: Radiation Oncology | Admitting: Radiation Oncology

## 2016-06-22 ENCOUNTER — Encounter: Payer: Self-pay | Admitting: Radiation Oncology

## 2016-06-22 VITALS — Wt 156.5 lb

## 2016-06-22 DIAGNOSIS — C50312 Malignant neoplasm of lower-inner quadrant of left female breast: Secondary | ICD-10-CM

## 2016-06-22 DIAGNOSIS — Z510 Encounter for antineoplastic radiation therapy: Secondary | ICD-10-CM | POA: Diagnosis not present

## 2016-06-22 MED ORDER — HYDROCODONE-ACETAMINOPHEN 5-325 MG PO TABS: 1.0000 | ORAL_TABLET | Freq: Four times a day (QID) | ORAL | 0 refills | Status: DC | PRN

## 2016-06-22 MED ORDER — SILVER SULFADIAZINE 1 % EX CREA
TOPICAL_CREAM | Freq: Two times a day (BID) | CUTANEOUS | Status: DC
  Administered 2016-06-22: 1 via TOPICAL

## 2016-06-22 NOTE — Telephone Encounter (Signed)
7/19 - I requested the case # and contact info for CT Head from [employer] - case [id number].  I called [employer] and was told the case was for CT Abd Pelvis and had been denied for "clinical information does not meet required documentation".  I requested to speak with a nurse and was transferred.    I provided additional clinical info requested to the nurse, was told she could not approve it and would need to send it for reconsideration, with a fax decision sent within 24 hours.  I asked the nurse if she could look up the case for the CT Head.  She did so, gave me case [id number] and said it was initially denied for the same reason as above.  I provided additional clinical info requested to the nurse, was told she could not approve it and would need to send it for reconsideration, with a fax decision sent within 24 hours.    A detailed note was left for the desk RN on the status of the cases.  7/24 - The desk RN advised that she had not received any information on these cases.  I checked with managed care who said they had not received any information.    I called [employer].  I was told case [id number] for CT Abd Pelvis had been approved.  I was told case [id number] CT Head had been denied, denial reason "claustrophobia not a contra-indication to MRI".  I requested a peer to peer, and made an appt for peer to peer with Dr. [name] at 530 pm today - insurance MD to call Dr. Virgie Dad cell phone.    Information provided to Dr. [name].

## 2016-06-22 NOTE — Progress Notes (Signed)
The patient was seen briefly today after her treatment. She has noticed increased difficulty with pain and skin compromise under the left axilla. She has used radiaplex, sonafine, and hydrocortisone. None of these interventions have been beneficial. She started applying emla cream more than 4 times a day through the weekend. I evalauted her skin, she does have desquamation of the left axilla without wet changes. No open sites are present.   I have discussed with the patient that we can try silvadene cream BID, and we have provided this to her. I am concerned about her use of topical lidocaine exposure from her emla cream, and would not advise this use due to the possibility of lidocaine toxicity. She does have a history of polysubstance abuse, and she assures me that she has been able to use narcotics and has a friend who securely holds on to her medication. I have offered a prescription for norco 5/325 #30, no refills. She is counseled to let us know if her symptoms do not resolve.    Carola Rhine, PAC

## 2016-06-22 NOTE — Progress Notes (Signed)
[name] seen today with report of burning and level 5 pain in her left axillary region.  Area dry.  As ordered by [name]. PA, applied and given Silvadene creme to apply at least BID to the aforementioned region.  Advised that if she experiences any increased burning after applying Silvadene to please remove creme and inform us immediately and she stated understanding.  Will reassess her tomorrow.

## 2016-06-23 ENCOUNTER — Other Ambulatory Visit: Payer: Self-pay

## 2016-06-23 ENCOUNTER — Ambulatory Visit
Admission: RE | Admit: 2016-06-23 | Discharge: 2016-06-23 | Disposition: A | Discharge status: Home / Self Care | Payer: Medicaid Other | Source: Ambulatory Visit | Attending: Radiation Oncology | Admitting: Radiation Oncology

## 2016-06-23 DIAGNOSIS — Z510 Encounter for antineoplastic radiation therapy: Secondary | ICD-10-CM | POA: Diagnosis not present

## 2016-06-23 DIAGNOSIS — C50312 Malignant neoplasm of lower-inner quadrant of left female breast: Secondary | ICD-10-CM

## 2016-06-23 NOTE — Progress Notes (Signed)
Authorization obtained for CT AP and CT head.  Orders faxed to scheduling.

## 2016-06-24 ENCOUNTER — Ambulatory Visit
Admission: RE | Admit: 2016-06-24 | Discharge: 2016-06-24 | Disposition: A | Discharge status: Home / Self Care | Payer: Medicaid Other | Source: Ambulatory Visit | Attending: Radiation Oncology | Admitting: Radiation Oncology

## 2016-06-24 DIAGNOSIS — Z510 Encounter for antineoplastic radiation therapy: Secondary | ICD-10-CM | POA: Diagnosis not present

## 2016-06-25 ENCOUNTER — Ambulatory Visit
Admission: RE | Admit: 2016-06-25 | Discharge: 2016-06-25 | Disposition: A | Discharge status: Home / Self Care | Payer: Medicaid Other | Source: Ambulatory Visit | Attending: Radiation Oncology | Admitting: Radiation Oncology

## 2016-06-25 DIAGNOSIS — Z510 Encounter for antineoplastic radiation therapy: Secondary | ICD-10-CM | POA: Diagnosis not present

## 2016-06-25 NOTE — Progress Notes (Signed)
Department of Radiation Oncology  Phone:  [phone] Fax:        [phone]  Weekly Treatment Note    Name: [name] Date: 06/26/2016 MRN: [id number] DOB: [birth year]   Diagnosis:     ICD-9-CM ICD-10-CM   1. Primary cancer of lower-inner quadrant of left female breast ([facility]) 174.3 C50.312      Current dose: 33.4 Gy  Current fraction:13   MEDICATIONS: Current Outpatient Prescriptions  Medication Sig Dispense Refill  . cetirizine (ZYRTEC) 10 MG tablet Take 1 tablet (10 mg total) by mouth daily. 30 tablet 11  . diphenoxylate-atropine (LOMOTIL) 2.5-0.025 MG tablet Take 2 tablets by mouth 4 (four) times daily as needed for diarrhea or loose stools. 40 tablet 0  . fluticasone (FLONASE) 50 MCG/ACT nasal spray Place 1 spray into the nose daily.    [name] gabapentin (NEURONTIN) 300 MG capsule Take 2 capsules (600 mg total) by mouth 3 (three) times daily. 300 mg at night for one day, twice daily for one day, then three times daily 180 capsule 2  . hyaluronate sodium (RADIAPLEXRX) GEL Apply 1 application topically.    [name] HYDROcodone-acetaminophen (NORCO) 5-325 MG tablet Take 1-2 tablets by mouth every 6 (six) hours as needed for moderate pain. 30 tablet 0  . lamoTRIgine (LAMICTAL) 100 MG tablet Take 150 mg by mouth 2 (two) times daily.     [name] lidocaine-prilocaine (EMLA) cream Apply 1 application topically as needed. 30 g 0  . lithium carbonate 300 MG capsule Take 300 mg by mouth 2 (two) times daily with a meal.    . LORazepam (ATIVAN) 0.5 MG tablet Take 1 tablet (0.5 mg total) by mouth every 8 (eight) hours. 30 tablet 0  . non-metallic deodorant (ALRA) MISC Apply 1 application topically daily as needed.    . ondansetron (ZOFRAN) 8 MG tablet Take 1 tablet (8 mg total) by mouth every 8 (eight) hours as needed for nausea or vomiting. 30 tablet 1  . polyethylene glycol powder (GLYCOLAX/MIRALAX) powder Take 17 g by mouth 2 (two) times daily as needed for mild constipation. 3350 g 3  .  potassium chloride (K-DUR) 10 MEQ tablet Take 10 mEq by mouth daily.    . pregabalin (LYRICA) 75 MG capsule Take 1 capsule (75 mg total) by mouth 2 (two) times daily. 60 capsule 3  . prochlorperazine (COMPAZINE) 10 MG tablet Take 10 mg by mouth daily as needed.    [name] tiZANidine (ZANAFLEX) 4 MG tablet Take 1 tablet (4 mg total) by mouth daily. 30 tablet 0  . traMADol (ULTRAM) 50 MG tablet Take 1 tablet (50 mg total) by mouth every 6 (six) hours as needed. 60 tablet 0  . triamcinolone cream (KENALOG) 0.5 % Apply 1 application topically daily as needed.  1  . trolamine salicylate (ASPERCREME) 10 % cream Apply 1 application topically as needed for muscle pain. Reported on 06/01/2016    . varenicline (CHANTIX) 0.5 MG tablet Take 1 tablet (0.5 mg total) by mouth daily. 30 tablet 3  . Vitamin D, Cholecalciferol, 1000 units CAPS Take 1,000 Units by mouth daily.    . pantoprazole (PROTONIX) 40 MG tablet Take 1 tablet (40 mg total) by mouth daily. 30 tablet 3   No current facility-administered medications for this encounter.      ALLERGIES: Demerol and Erythromycin   LABORATORY DATA:  Lab Results  Component Value Date   WBC 5.9 06/03/2016   HGB 11.3 (L) 06/03/2016   HCT 34.7 (L) 06/03/2016   MCV  83.2 06/03/2016   PLT 201 06/03/2016   Lab Results  Component Value Date   NA 141 06/03/2016   K 4.0 06/03/2016   CL 106 02/28/2015   CO2 26 06/03/2016   Lab Results  Component Value Date   ALT 14 06/03/2016   AST 19 06/03/2016   ALKPHOS 48 06/03/2016   BILITOT <0.30 06/03/2016     NARRATIVE: [name] was seen today for weekly treatment management. The chart was checked and the patient's films were reviewed.  The patient is doing well. Her electron boost was set up today.   PHYSICAL EXAMINATION: height is 5' 5.5" (1.664 m) and weight is 157 lb 1.6 oz (71.3 kg). Her temperature is 98 F (36.7 C). Her blood pressure is 111/60 and her pulse is 85.     [name] completes XRT to her left  breast.  She reports that her skin feels tight in the treatment area, but presently, has no pain.  Skin dry and peeling.  States relief with silvadene in the axillary region.  ASSESSMENT: The patient has completed treatment.   PLAN: Dr. Lisbeth Renshaw will follow up with the patient in one month    ------------------------------------------------   Tyler Pita, MD [place] Director and Director of Stereotactic Radiosurgery Direct Dial: [phone]  Fax: [phone] [email or web address]  Skype  [email or web address]    This document serves as a record of services personally performed by Tyler Pita, MD. It was created on his behalf by Truddie Hidden, a trained medical scribe. The creation of this record is based on the scribe's personal observations and the provider's statements to them. This document has been checked and approved by the attending provider.

## 2016-06-26 ENCOUNTER — Telehealth: Payer: Self-pay | Admitting: *Deleted

## 2016-06-26 ENCOUNTER — Encounter: Payer: Self-pay | Admitting: Radiation Oncology

## 2016-06-26 ENCOUNTER — Ambulatory Visit
Admission: RE | Admit: 2016-06-26 | Discharge: 2016-06-26 | Disposition: A | Discharge status: Home / Self Care | Payer: Medicaid Other | Source: Ambulatory Visit | Attending: Radiation Oncology | Admitting: Radiation Oncology

## 2016-06-26 ENCOUNTER — Other Ambulatory Visit: Payer: Self-pay | Admitting: Oncology

## 2016-06-26 VITALS — BP 111/60 | HR 85 | Temp 98.0°F | Ht 65.5 in | Wt 157.1 lb

## 2016-06-26 DIAGNOSIS — Z510 Encounter for antineoplastic radiation therapy: Secondary | ICD-10-CM | POA: Diagnosis not present

## 2016-06-26 DIAGNOSIS — D509 Iron deficiency anemia, unspecified: Secondary | ICD-10-CM | POA: Insufficient documentation

## 2016-06-26 DIAGNOSIS — C50312 Malignant neoplasm of lower-inner quadrant of left female breast: Secondary | ICD-10-CM

## 2016-06-26 NOTE — Progress Notes (Signed)
[name] completes XRT to her left breast.  She reports that her skin feels tight in the treatment area, but presently, has no pain.  Skin dry and peeling.  States relief with silvadene in the axillary region.

## 2016-06-26 NOTE — Telephone Encounter (Signed)
  Oncology Nurse Navigator Documentation    Navigator Encounter Type: Telephone (Left vm to congratulate pt on completion of xrt) (06/26/16 1300) Telephone: Outgoing Call (Contact information provided.) (06/26/16 1300)         Patient Visit Type: [id number] (06/26/16 1300) Treatment Phase: Final Radiation Tx (06/26/16 1300)                            Time Spent with Patient: 15 (06/26/16 1300)

## 2016-06-29 ENCOUNTER — Ambulatory Visit: Payer: Medicaid Other

## 2016-06-30 ENCOUNTER — Ambulatory Visit: Payer: Medicaid Other

## 2016-06-30 ENCOUNTER — Encounter: Payer: Self-pay | Admitting: Oncology

## 2016-06-30 ENCOUNTER — Other Ambulatory Visit (HOSPITAL_COMMUNITY): Payer: Medicaid Other

## 2016-06-30 ENCOUNTER — Other Ambulatory Visit: Payer: Self-pay | Admitting: Oncology

## 2016-06-30 NOTE — Progress Notes (Unsigned)
Received a message from [facility] about [facility] Medicaid. I took the paperwork up to [place] in [facility] to ask what needed to be done. Contacted the patient back who gave me the name and number of the person to contact in [facility] which is where the letter came from. Contacted a number of people after being passed around, left message for them to contact Etheleen Sia here to coordinate what needed to be done for patient. Emailed Christine the contact information I received.

## 2016-07-01 ENCOUNTER — Encounter (HOSPITAL_COMMUNITY): Payer: Self-pay | Admitting: Internal Medicine

## 2016-07-01 ENCOUNTER — Ambulatory Visit: Payer: Medicaid Other

## 2016-07-01 ENCOUNTER — Ambulatory Visit (HOSPITAL_BASED_OUTPATIENT_CLINIC_OR_DEPARTMENT_OTHER)
Admission: RE | Admit: 2016-07-01 | Discharge: 2016-07-01 | Disposition: A | Discharge status: Home / Self Care | Payer: Medicaid Other | Source: Ambulatory Visit | Attending: Internal Medicine | Admitting: Internal Medicine

## 2016-07-01 ENCOUNTER — Ambulatory Visit (HOSPITAL_COMMUNITY)
Admission: RE | Admit: 2016-07-01 | Discharge: 2016-07-01 | Disposition: A | Discharge status: Home / Self Care | Payer: Medicaid Other | Source: Ambulatory Visit | Attending: Oncology | Admitting: Oncology

## 2016-07-01 VITALS — BP 110/66 | HR 80 | Wt 158.8 lb

## 2016-07-01 DIAGNOSIS — C50312 Malignant neoplasm of lower-inner quadrant of left female breast: Secondary | ICD-10-CM

## 2016-07-01 DIAGNOSIS — Z720 Tobacco use: Secondary | ICD-10-CM | POA: Insufficient documentation

## 2016-07-01 DIAGNOSIS — E119 Type 2 diabetes mellitus without complications: Secondary | ICD-10-CM | POA: Diagnosis not present

## 2016-07-01 DIAGNOSIS — Z09 Encounter for follow-up examination after completed treatment for conditions other than malignant neoplasm: Secondary | ICD-10-CM | POA: Diagnosis present

## 2016-07-01 NOTE — Progress Notes (Signed)
Echocardiogram 2D Echocardiogram has been performed.  [name] 07/01/2016, 2:49 PM

## 2016-07-01 NOTE — Patient Instructions (Signed)
We will contact you in 4 months to schedule your next appointment.  

## 2016-07-01 NOTE — Progress Notes (Signed)
Patient ID: Tyese Finken, female   DOB: [birth year], 55 y.o.   MRN: [id number]   CARDIO-ONCOLOGY CLINIC CONSULT NOTE  Referring Physician: Magrinat   HPI:  Ms. Langford is a 55 y/o woman with left breast CA (diagnosed 7/16) referred by Dr. [name] for enrollment into the [facility].   Biopsy of the left breast mass 06/19/2015 showed (SP [id number]) an invasive ductal carcinoma, grade 2, estrogen receptor 83% positive, progesterone receptor 26% positive, and HER-2 amplified by immunohistochemstry with a 3+ reading.   Underwent left mastectomy with left axillary sentinel lymph node sampling, which, since the lymph nodes were positive, extended to the procedure to left axillary lymph node dissection. The pathology ([id number]) showed an invasive ductal carcinoma, grade 3, measuring in excess of 9 cm. There were also skin satellites, not contiguous with the invasive carcinoma. . A total of 15 lymph nodes were removed, including 5 sentinel lymph nodes, all of which were positive, so that the final total was 14 out of 15 lymph nodes involved by tumor. There was evidence of extranodal extension. The final pathology was pT4b pN2a, stage IIIB   Unfortunately CT scans of the chest abdomen and pelvis 09/16/2015 showed bony metastases to the right scapula, left iliac crest, and also L4 and T-spine. There were questionable liver cysts which on repeat CT scan 03/02/2016 appear to be a little bit more well-defined, possibly a little larger. There were also some possible right upper lobe lung lesions.  Adjuvant treatment consisted of docetaxel, trastuzumab and pertuzumab (started 11/16), with the final (6th) docetaxel dose given 02/11/2016. She continues on trastuzumab and pertuzumab, with the 11th cycle given 05/05/2016. Echocardiogram 02/26/2016 showed an ejection fraction of 55%. She receives denosumab/Xgeva every 6 weeks.. Finished radiation 06/26/2016.  Recently moved back from [place]. Denies  h/o heart disease. Feels ok but struggling with diarrhea. No edema, orthopnea, PND. Mows grass without to much problem. Does get tired. Plan is for ongoing herceptin treatments  Echo 60-65% lateral s' 10.8 cm/s GLS 19.8%    Review of Systems: [y] = yes, _0  = no   General: Weight gain _1 ; Weight loss _2 ; Anorexia _3 ; Fatigue Blue.Reese ]; Fever _4 ; Chills _5 ; Weakness _6   Cardiac: Chest pain/pressure _7 ; Resting SOB _8 ; Exertional SOB _9 ; Orthopnea _10 ; Pedal Edema _11 ; Palpitations _12 ; Syncope _13 ; Presyncope _14 ; Paroxysmal nocturnal dyspnea_15   Pulmonary: Cough _16 ; Wheezing_17 ; Hemoptysis_18 ; Sputum _19 ; Snoring _20   GI: Vomiting_21 ; Dysphagia_22 ; Melena_23 ; Hematochezia _24 ; Heartburn_25 ; Abdominal pain _26 ; Constipation _27 ; Diarrhea Blue.Reese ]; BRBPR _28   GU: Hematuria_29 ; Dysuria _30 ; Nocturia_31   Vascular: Pain in legs with walking _32 ; Pain in feet with lying flat _33 ; Non-healing sores _34 ; Stroke _35 ; TIA _36 ; Slurred speech _37 ;  Neuro: Headaches_38 ; Vertigo_39 ; Seizures_40 ; Paresthesias_41 ;Blurred vision _42 ; Diplopia _43 ; Vision changes _44   Ortho/Skin: Arthritis [ y]; Joint pain _45 ; Muscle pain _46 ; Joint swelling _47 ; Back Pain _48 ; Rash _49   Psych: Depression[ y]; Anxiety_50   Heme: Bleeding problems _51 ; Clotting disorders _52 ; Anemia _53   Endocrine: Diabetes _54 ; Thyroid dysfunction_55    Past Medical History:  Diagnosis Date  . Alcohol abuse   . Anxiety    At age 25  . Arthritis Dx 2010  . Bipolar  disorder ([place])   . Chronic pain   . Depression   . Fibromyalgia Dx 2005  . Opiate dependence ([place])   . PTSD (post-traumatic stress disorder)     Current Outpatient Prescriptions  Medication Sig Dispense Refill  . cetirizine (ZYRTEC) 10 MG tablet Take 1 tablet (10 mg total) by mouth daily. 30 tablet 11  . diphenoxylate-atropine (LOMOTIL) 2.5-0.025 MG tablet Take 2 tablets by mouth 4 (four) times daily as needed for diarrhea or loose stools. 40 tablet 0  . fluticasone  (FLONASE) 50 MCG/ACT nasal spray Place 1 spray into the nose daily.    [name] gabapentin (NEURONTIN) 300 MG capsule Take 2 capsules (600 mg total) by mouth 3 (three) times daily. 300 mg at night for one day, twice daily for one day, then three times daily 180 capsule 2  . hyaluronate sodium (RADIAPLEXRX) GEL Apply 1 application topically.    . lamoTRIgine (LAMICTAL) 100 MG tablet Take 150 mg by mouth 2 (two) times daily.     [name] lidocaine-prilocaine (EMLA) cream Apply 1 application topically as needed. 30 g 0  . lithium carbonate 300 MG capsule Take 300 mg by mouth 2 (two) times daily with a meal.    . LORazepam (ATIVAN) 0.5 MG tablet Take 1 tablet (0.5 mg total) by mouth every 8 (eight) hours. 30 tablet 0  . non-metallic deodorant (ALRA) MISC Apply 1 application topically daily as needed.    . ondansetron (ZOFRAN) 8 MG tablet Take 1 tablet (8 mg total) by mouth every 8 (eight) hours as needed for nausea or vomiting. 30 tablet 1  . pantoprazole (PROTONIX) 40 MG tablet Take 1 tablet (40 mg total) by mouth daily. 30 tablet 3  . polyethylene glycol powder (GLYCOLAX/MIRALAX) powder Take 17 g by mouth 2 (two) times daily as needed for mild constipation. 3350 g 3  . potassium chloride (K-DUR) 10 MEQ tablet Take 10 mEq by mouth daily.    . pregabalin (LYRICA) 75 MG capsule Take 1 capsule (75 mg total) by mouth 2 (two) times daily. 60 capsule 3  . prochlorperazine (COMPAZINE) 10 MG tablet Take 10 mg by mouth daily as needed.    [name] tiZANidine (ZANAFLEX) 4 MG tablet Take 1 tablet (4 mg total) by mouth daily. 30 tablet 0  . traMADol (ULTRAM) 50 MG tablet Take 1 tablet (50 mg total) by mouth every 6 (six) hours as needed. 60 tablet 0  . triamcinolone cream (KENALOG) 0.5 % Apply 1 application topically daily as needed.  1  . varenicline (CHANTIX) 0.5 MG tablet Take 1 tablet (0.5 mg total) by mouth daily. 30 tablet 3  . Vitamin D, Cholecalciferol, 1000 units CAPS Take 1,000 Units by mouth daily.    [name] trolamine  salicylate (ASPERCREME) 10 % cream Apply 1 application topically as needed for muscle pain. Reported on 06/01/2016     No current facility-administered medications for this encounter.     Allergies  Allergen Reactions  . Demerol Itching and Nausea And Vomiting  . Erythromycin Rash    Can take zpak      Social History   Social History  . Marital status: Legally Separated    Spouse name: N/A  . Number of children: N/A  . Years of education: N/A   Occupational History  . Not on file.   Social History Main Topics  . Smoking status: Current Every Day Smoker    Packs/day: 1.00    Types: Cigarettes  . Smokeless tobacco: Never Used  . Alcohol  use Yes     Comment: no ETOH since May  . Drug use: No     Comment: prescription opiates from the street daily until Sunday  . Sexual activity: No     Comment: ablation   Other Topics Concern  . Not on file   Social History Narrative  . No narrative on file      Family History  Problem Relation Age of Onset  . Diabetes Mother     Vitals:   07/01/16 1457  BP: 110/66  Pulse: 80  SpO2: 98%  Weight: 158 lb 12 oz (72 kg)    PHYSICAL EXAM: General:  Well appearing. No respiratory difficulty HEENT: normal Neck: supple. no JVD. Carotids 2+ bilat; no bruits. No lymphadenopathy or thryomegaly appreciated. Cor: Erythema over left breast PMI nondisplaced. Regular rate & rhythm. No rubs, gallops or murmurs. Lungs: clear Abdomen: soft, nontender, nondistended. No hepatosplenomegaly. No bruits or masses. Good bowel sounds. Extremities: no cyanosis, clubbing, rash, edema Neuro: alert & oriented x 3, cranial nerves grossly intact. moves all 4 extremities w/o difficulty. Affect pleasant.  ASSESSMENT & PLAN: 1. 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 EF and Doppler parameters stable. No  HF on exam. Continue Herceptin. Will repeat echo in 4 months then q6 months indefinitely.   Kalah Pflum,MD 3:44 PM

## 2016-07-02 ENCOUNTER — Encounter: Payer: Self-pay | Admitting: Oncology

## 2016-07-02 ENCOUNTER — Ambulatory Visit: Admission: RE | Admit: 2016-07-02 | Payer: Medicaid Other | Source: Ambulatory Visit

## 2016-07-02 ENCOUNTER — Ambulatory Visit (HOSPITAL_COMMUNITY)
Admission: RE | Admit: 2016-07-02 | Discharge: 2016-07-02 | Disposition: A | Discharge status: Home / Self Care | Payer: Medicaid Other | Source: Ambulatory Visit | Attending: Oncology | Admitting: Oncology

## 2016-07-02 ENCOUNTER — Encounter (HOSPITAL_COMMUNITY): Payer: Self-pay

## 2016-07-02 ENCOUNTER — Encounter (HOSPITAL_COMMUNITY)
Admission: RE | Admit: 2016-07-02 | Discharge: 2016-07-02 | Disposition: A | Discharge status: Home / Self Care | Payer: Medicaid Other | Source: Ambulatory Visit | Attending: Oncology | Admitting: Oncology

## 2016-07-02 DIAGNOSIS — C50312 Malignant neoplasm of lower-inner quadrant of left female breast: Secondary | ICD-10-CM | POA: Insufficient documentation

## 2016-07-02 MED ORDER — TECHNETIUM TC 99M MEDRONATE IV KIT
25.0000 | PACK | Freq: Once | INTRAVENOUS | Status: AC | PRN
  Administered 2016-07-02: 27 via INTRAVENOUS

## 2016-07-02 NOTE — Progress Notes (Unsigned)
Patient came in office to pick up application for Medicaid. Gave short form for her to complete and faxed to [place]. Fax received ok per confirmation sheet. Patient needed assistance getting medications. Also approved patient for one-time $1000 [employer] grant and explained that the medicines would have to be filled at our outpatient pharmacy to use the grant. The contact information and phone number for our outpatient pharmacy is on the grant form. Copy of expense sheet was given to patient. Patient received a gas card today from grant. Patient has my card for any additonal financial questions or concerns.

## 2016-07-03 ENCOUNTER — Telehealth: Payer: Self-pay

## 2016-07-03 NOTE — Telephone Encounter (Signed)
Pt requesting results of bone scan from yesterday. Read results to pt.

## 2016-07-07 ENCOUNTER — Other Ambulatory Visit (HOSPITAL_BASED_OUTPATIENT_CLINIC_OR_DEPARTMENT_OTHER): Payer: Medicaid Other

## 2016-07-07 ENCOUNTER — Other Ambulatory Visit: Payer: Self-pay | Admitting: *Deleted

## 2016-07-07 ENCOUNTER — Other Ambulatory Visit: Payer: Self-pay | Admitting: Oncology

## 2016-07-07 ENCOUNTER — Ambulatory Visit (HOSPITAL_BASED_OUTPATIENT_CLINIC_OR_DEPARTMENT_OTHER): Payer: Medicaid Other

## 2016-07-07 VITALS — BP 102/70 | HR 73 | Temp 98.0°F | Resp 18

## 2016-07-07 DIAGNOSIS — C7951 Secondary malignant neoplasm of bone: Secondary | ICD-10-CM | POA: Diagnosis not present

## 2016-07-07 DIAGNOSIS — J302 Other seasonal allergic rhinitis: Secondary | ICD-10-CM

## 2016-07-07 DIAGNOSIS — C50312 Malignant neoplasm of lower-inner quadrant of left female breast: Secondary | ICD-10-CM | POA: Diagnosis present

## 2016-07-07 DIAGNOSIS — Z5112 Encounter for antineoplastic immunotherapy: Secondary | ICD-10-CM

## 2016-07-07 DIAGNOSIS — G894 Chronic pain syndrome: Secondary | ICD-10-CM

## 2016-07-07 DIAGNOSIS — C773 Secondary and unspecified malignant neoplasm of axilla and upper limb lymph nodes: Secondary | ICD-10-CM | POA: Diagnosis not present

## 2016-07-07 DIAGNOSIS — M797 Fibromyalgia: Secondary | ICD-10-CM

## 2016-07-07 LAB — CBC WITH DIFFERENTIAL/PLATELET
BASO%: 0.2 % (ref 0.0–2.0)
Basophils Absolute: 0 10e3/uL (ref 0.0–0.1)
EOS%: 2 % (ref 0.0–7.0)
Eosinophils Absolute: 0.1 10e3/uL (ref 0.0–0.5)
HCT: 37.1 % (ref 34.8–46.6)
HGB: 12 g/dL (ref 11.6–15.9)
LYMPH%: 13.1 % — ABNORMAL LOW (ref 14.0–49.7)
MCH: 28.7 pg (ref 25.1–34.0)
MCHC: 32.3 g/dL (ref 31.5–36.0)
MCV: 88.8 fL (ref 79.5–101.0)
MONO#: 0.5 10e3/uL (ref 0.1–0.9)
MONO%: 8.2 % (ref 0.0–14.0)
NEUT#: 4.2 10e3/uL (ref 1.5–6.5)
NEUT%: 76.5 % (ref 38.4–76.8)
Platelets: 180 10e3/uL (ref 145–400)
RBC: 4.18 10e6/uL (ref 3.70–5.45)
RDW: 17.2 % — ABNORMAL HIGH (ref 11.2–14.5)
WBC: 5.5 10e3/uL (ref 3.9–10.3)
lymph#: 0.7 10e3/uL — ABNORMAL LOW (ref 0.9–3.3)
nRBC: 0 % (ref 0–0)

## 2016-07-07 LAB — COMPREHENSIVE METABOLIC PANEL
ALT: 12 U/L (ref 0–55)
AST: 14 U/L (ref 5–34)
Albumin: 4 g/dL (ref 3.5–5.0)
Alkaline Phosphatase: 52 U/L (ref 40–150)
Anion Gap: 6 mEq/L (ref 3–11)
BUN: 12.2 mg/dL (ref 7.0–26.0)
CO2: 26 mEq/L (ref 22–29)
Calcium: 9.7 mg/dL (ref 8.4–10.4)
Chloride: 106 mEq/L (ref 98–109)
Creatinine: 1 mg/dL (ref 0.6–1.1)
EGFR: 66 mL/min/{1.73_m2} — ABNORMAL LOW (ref >90)
Glucose: 102 mg/dl (ref 70–140)
Potassium: 4.4 mEq/L (ref 3.5–5.1)
Sodium: 137 mEq/L (ref 136–145)
Total Bilirubin: <0.3 mg/dL (ref 0.20–1.20)
Total Protein: 6.6 g/dL (ref 6.4–8.3)

## 2016-07-07 MED ORDER — TRASTUZUMAB CHEMO 150 MG IV SOLR
6.0000 mg/kg | Freq: Once | INTRAVENOUS | Status: AC
  Administered 2016-07-07: 420 mg via INTRAVENOUS
  Filled 2016-07-07: qty 20

## 2016-07-07 MED ORDER — CETIRIZINE HCL 10 MG PO TABS: 10.0000 mg | ORAL_TABLET | Freq: Every day | ORAL | 11 refills | Status: DC

## 2016-07-07 MED ORDER — SODIUM CHLORIDE 0.9 % IV SOLN
Freq: Once | INTRAVENOUS | Status: AC
  Administered 2016-07-07: 13:00:00 via INTRAVENOUS

## 2016-07-07 MED ORDER — HEPARIN SOD (PORK) LOCK FLUSH 100 UNIT/ML IV SOLN
500.0000 [IU] | Freq: Once | INTRAVENOUS | Status: AC | PRN
  Administered 2016-07-07: 500 [IU]
  Filled 2016-07-07: qty 5

## 2016-07-07 MED ORDER — POTASSIUM CHLORIDE ER 10 MEQ PO TBCR: 10.0000 meq | EXTENDED_RELEASE_TABLET | Freq: Every day | ORAL | 3 refills | Status: DC

## 2016-07-07 MED ORDER — SODIUM CHLORIDE 0.9% FLUSH
10.0000 mL | INTRAVENOUS | Status: DC | PRN
  Administered 2016-07-07: 10 mL
  Filled 2016-07-07: qty 10

## 2016-07-07 MED ORDER — GABAPENTIN 300 MG PO CAPS: 600.0000 mg | ORAL_CAPSULE | Freq: Three times a day (TID) | ORAL | 2 refills | Status: DC

## 2016-07-07 MED ORDER — DIPHENHYDRAMINE HCL 25 MG PO CAPS: 25.0000 mg | ORAL_CAPSULE | Freq: Once | ORAL | Status: DC

## 2016-07-07 MED ORDER — LAMOTRIGINE 100 MG PO TABS: 150.0000 mg | ORAL_TABLET | Freq: Two times a day (BID) | ORAL | 3 refills | Status: DC

## 2016-07-07 MED ORDER — SODIUM CHLORIDE 0.9 % IV SOLN
420.0000 mg | Freq: Once | INTRAVENOUS | Status: AC
  Administered 2016-07-07: 420 mg via INTRAVENOUS
  Filled 2016-07-07: qty 14

## 2016-07-07 MED ORDER — ACETAMINOPHEN 325 MG PO TABS: 650.0000 mg | ORAL_TABLET | Freq: Once | ORAL | Status: DC

## 2016-07-07 MED ORDER — LITHIUM CARBONATE 300 MG PO CAPS: 300.0000 mg | ORAL_CAPSULE | Freq: Two times a day (BID) | ORAL | 3 refills | Status: DC

## 2016-07-07 MED FILL — GABAPENTIN 300 MG CAPSULE: 300 | 30 days supply | Qty: 180 | Fill #0

## 2016-07-07 MED FILL — LITHIUM CARBONATE 300 MG CA: 300 | 30 days supply | Qty: 60 | Fill #0

## 2016-07-07 MED FILL — POTASSIUM CL ER 10 MEQ TAB: 10 | 30 days supply | Qty: 30 | Fill #0

## 2016-07-07 MED FILL — lamoTRIgine 100 MG TABS: 100 | 30 days supply | Qty: 60 | Fill #0

## 2016-07-07 NOTE — Patient Instructions (Signed)
[facility] Discharge Instructions for Patients Receiving Chemotherapy  Today you received the following chemotherapy agents Herceptin and Perjeta   To help prevent nausea and vomiting after your treatment, we encourage you to take your nausea medication as directed.    If you develop nausea and vomiting that is not controlled by your nausea medication, call the clinic.   BELOW ARE SYMPTOMS THAT SHOULD BE REPORTED IMMEDIATELY:  *FEVER GREATER THAN 100.5 F  *CHILLS WITH OR WITHOUT FEVER  NAUSEA AND VOMITING THAT IS NOT CONTROLLED WITH YOUR NAUSEA MEDICATION  *UNUSUAL SHORTNESS OF BREATH  *UNUSUAL BRUISING OR BLEEDING  TENDERNESS IN MOUTH AND THROAT WITH OR WITHOUT PRESENCE OF ULCERS  *URINARY PROBLEMS  *BOWEL PROBLEMS  UNUSUAL RASH Items with * indicate a potential emergency and should be followed up as soon as possible.  Feel free to call the clinic you have any questions or concerns. The clinic phone number is [phone].  Please show the CHEMO ALERT CARD at check-in to the Emergency Department and triage nurse.   

## 2016-07-08 ENCOUNTER — Other Ambulatory Visit: Payer: Self-pay | Admitting: Oncology

## 2016-07-08 ENCOUNTER — Encounter (HOSPITAL_COMMUNITY): Payer: Self-pay

## 2016-07-08 ENCOUNTER — Telehealth: Payer: Self-pay | Admitting: *Deleted

## 2016-07-08 ENCOUNTER — Ambulatory Visit (HOSPITAL_COMMUNITY)
Admission: RE | Admit: 2016-07-08 | Discharge: 2016-07-08 | Disposition: A | Discharge status: Home / Self Care | Payer: Medicaid Other | Source: Ambulatory Visit | Attending: Oncology | Admitting: Oncology

## 2016-07-08 ENCOUNTER — Other Ambulatory Visit: Payer: Self-pay | Admitting: Radiation Therapy

## 2016-07-08 DIAGNOSIS — G939 Disorder of brain, unspecified: Secondary | ICD-10-CM | POA: Insufficient documentation

## 2016-07-08 DIAGNOSIS — C50312 Malignant neoplasm of lower-inner quadrant of left female breast: Secondary | ICD-10-CM | POA: Insufficient documentation

## 2016-07-08 DIAGNOSIS — C50912 Malignant neoplasm of unspecified site of left female breast: Secondary | ICD-10-CM

## 2016-07-08 LAB — LITHIUM LEVEL: Lithium: 1 mmol/L (ref 0.6–1.2)

## 2016-07-08 LAB — CANCER ANTIGEN 27.29: CA 27.29: 5.8 U/mL (ref 0.0–38.6)

## 2016-07-08 MED ORDER — IOPAMIDOL (ISOVUE-300) INJECTION 61%
100.0000 mL | Freq: Once | INTRAVENOUS | Status: AC | PRN
  Administered 2016-07-08: 100 mL via INTRAVENOUS

## 2016-07-08 MED ORDER — DIATRIZOATE MEGLUMINE & SODIUM 66-10 % PO SOLN
30.0000 mL | Freq: Once | ORAL | Status: AC
  Administered 2016-07-08: 30 mL via ORAL

## 2016-07-08 NOTE — Telephone Encounter (Signed)
This RN spoke with pt post MD call informing her of result of CT of her brain with new area of concern.  Pt very emotional and tearful- in discussion stating " I am scared and want to crawl under my bed or in the closet "  " I am so tired and have been thru so much this year - just feel ran over and don't know if I can do more "  This RN validated pt's feelings as well as plan per this office for therapy which is known to benefit her.  Per discussion including inquiry by this RN regarding support she has locally - with [name] stating " I really do not have anyone and my children are so excited that I am getting to the end of the aggressive therapy that I just can't tell them this right now "  [name] states she has contacted her ex husband who can be a support " though sometimes he gets mad at me and then won't talk to me " Per above she states he will call her this evening.  Per end of call with pt stating appreciation due to how she was feeling - now not as emotional.  Plan is for this RN to contact pt tomorrow as well as she will be referred to support services for additional resources for support.  No other needs at this time.

## 2016-07-08 NOTE — Progress Notes (Signed)
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 By then she had developed left breast and left axillary swelling (June 2016). She presented to the local emergency room and had a chest CT scan 06/06/2015 which showed a nodule in the left breast measuring 0.9 cm and questionable left axillary adenopathy. She then proceeded to bilateral diagnostic mammography and left breast ultrasonography 06/19/2015. There were no prior films for comparison (last mammography 12 years prior).. The breast density was category C. Mammography showed in the left breast upper inner quadrant a 7 cm area including a small mass and significant pleomorphic calcifications. Ultrasonography defined the mass as measuring 1.2 cm. The left axilla appeared unremarkable. There was significant skin edema.  Biopsy of the left breast mass 06/19/2015 showed (SP [id number]) an invasive ductal carcinoma, grade 2, estrogen receptor 83% positive, progesterone receptor 26% positive, and HER-2 amplified by immunohistochemstry with a 3+ reading. The patient had biopsies of a separate area in the left breast August of the same year and this showed atypical ductal hyperplasia. (SP [id number]).  Accordingly after appropriate discussion on 08/21/2015 the patient proceeded to left mastectomy  with left axillary sentinel lymph node sampling, which, since the lymph nodes were positive, extended to the procedure to left axillary lymph node dissection. The pathology (SP [id number]) showed an invasive ductal carcinoma, grade 3, measuring in excess of 9 cm. There were also skin satellites, not contiguous with the invasive carcinoma. Margins were clear and ample. There was evidence of lymphovascular invasion. A total of 15 lymph nodes were removed, including 5 sentinel lymph nodes, all of which were positive, so that the final total was 14 out of 15 lymph nodes involved by tumor. There was evidence of extranodal extension. The final pathology was pT4b pN2a, stage IIIB  CA-27-29 and CEA 09/19/2015 were non-informative October 2016.  Unfortunately CT scans of the chest abdomen and pelvis 09/16/2015 showed bony metastases to the right scapula, left iliac crest, and also L4 and T-spine. There were questionable liver cysts which on repeat CT scan 03/02/2016 appear to be a little bit more well-defined, possibly a little larger. There were also some possible right upper lobe lung lesions.  Adjuvant treatment consisted of docetaxel, trastuzumab and pertuzumab, with the final (6th) docetaxel dose given 02/11/2016. She continues on trastuzumab and pertuzumab, with the 11th cycle given 05/05/2016. Echocardiogram 02/26/2016 showed an ejection fraction of 55%. She receives denosumab/Xgeva every 6 weeks.. She also receives radiation, started 06/09/201, to be completed 06/26/2016.  Her subsequent history is as detailed below  INTERVAL HISTORY: [name] returns today for follow-up of her metastatic breast cancer. She continues on radiation, with significant pain and desquamation. Because of this she was not able to undergo an echocardiogram and this has been rescheduled.  She did have a chest CT scan yesterday, and this shows no evidence of cancer in her lungs or liver, and no  aggressive lytic or sclerotic bone  lesions. Bone scan is pending next week. The CT of the abdomen and pelvis was not approved.  She underwent Feraheme last week with no side effects.  REVIEW OF SYSTEMS: She continues depressed and anxious. Her living situation right now is suboptimal. She feels she has absolutely no support here and 10 but does not want to or cannot go back to [place] where she was before. She denies unusual headaches, visual changes, nausea, vomiting, dizziness, or gait imbalance. She denies cough or phlegm production. A detailed review of systems today was otherwise stable.  PAST MEDICAL HISTORY: Past Medical History:  Diagnosis Date  . Alcohol abuse   . Anxiety    At age 83  . Arthritis Dx 2010  . Bipolar disorder ([place])   . Chronic pain   . Depression   . Fibromyalgia Dx 2005  . Opiate dependence ([place])   . PTSD (post-traumatic stress disorder)     PAST SURGICAL HISTORY: Past Surgical History:  Procedure Laterality Date  . FIBULA FRACTURE SURGERY      FAMILY HISTORY Family History  Problem Relation Age of Onset  . Diabetes Mother   The patient's father still living, age 3, in [place]. He had prostate cancer at some point in the past. The patient's mother died at age 39 from complications of diabetes. The patient had no brothers, 2 sisters. A paternal grandmother had lung cancer in the setting of tobacco abuse. There is no other history of cancer in the family to her knowledge  GYNECOLOGIC HISTORY:  No LMP recorded. Patient has had an ablation. Menarche approximately age 27. First live birth in 56. The patient is GX P2. She underwent endometrial ablation in 2016.  SOCIAL HISTORY:  The patient is originally from [place]. She has lived in [place] before but more recently was in [place]. She is back here because she could not afford her rent in [place]. She is living here and a temporary situation. She has divorced. HER-2 children are [name] who lives in [place] and works  as a Development worker, community, and [name] who also lives in [place] and works as a Catering manager. The patient has a grandchild, 70 years old as of July 2017, living in [place] with his mother. The patient has not established herself with a local church yet.    ADVANCED DIRECTIVES: Not in place; at the 06/03/2016 visit the patient was given the appropriate forms to complete and notarize at her discretion   HEALTH MAINTENANCE: Social History  Substance Use Topics  . Smoking status: Current Every Day Smoker    Packs/day: 1.00    Types: Cigarettes  . Smokeless tobacco: Never Used  . Alcohol use Yes     Comment: no ETOH since May     Colonoscopy:  PAP:  Bone density:   Allergies  Allergen Reactions  . Demerol Itching and Nausea And Vomiting  . Erythromycin Rash    Can take zpak    Current Outpatient Prescriptions  Medication Sig Dispense Refill  . cetirizine (ZYRTEC) 10 MG tablet Take 1 tablet (10 mg total) by mouth daily. 30 tablet 11  . diphenoxylate-atropine (LOMOTIL) 2.5-0.025 MG tablet Take 2 tablets by mouth 4 (four) times daily as needed for diarrhea or loose stools. 40 tablet 0  . fluticasone (FLONASE) 50 MCG/ACT nasal spray Place 1 spray into the nose daily.    [name] gabapentin (NEURONTIN) 300 MG capsule Take 2 capsules (600 mg total) by mouth 3 (three) times daily. 300 mg  at night for one day, twice daily for one day, then three times daily 180 capsule 2  . hyaluronate sodium (RADIAPLEXRX) GEL Apply 1 application topically.    . lamoTRIgine (LAMICTAL) 100 MG tablet Take 1.5 tablets (150 mg total) by mouth 2 (two) times daily. 60 tablet 3  . lidocaine-prilocaine (EMLA) cream Apply 1 application topically as needed. 30 g 0  . lithium carbonate 300 MG capsule Take 1 capsule (300 mg total) by mouth 2 (two) times daily with a meal. 60 capsule 3  . LORazepam (ATIVAN) 0.5 MG tablet Take 1 tablet (0.5 mg total) by mouth every 8 (eight) hours. 30 tablet 0  . non-metallic deodorant (ALRA) MISC  Apply 1 application topically daily as needed.    . ondansetron (ZOFRAN) 8 MG tablet Take 1 tablet (8 mg total) by mouth every 8 (eight) hours as needed for nausea or vomiting. 30 tablet 1  . pantoprazole (PROTONIX) 40 MG tablet Take 1 tablet (40 mg total) by mouth daily. 30 tablet 3  . polyethylene glycol powder (GLYCOLAX/MIRALAX) powder Take 17 g by mouth 2 (two) times daily as needed for mild constipation. 3350 g 3  . potassium chloride (K-DUR) 10 MEQ tablet Take 1 tablet (10 mEq total) by mouth daily. 30 tablet 3  . pregabalin (LYRICA) 75 MG capsule Take 1 capsule (75 mg total) by mouth 2 (two) times daily. 60 capsule 3  . prochlorperazine (COMPAZINE) 10 MG tablet Take 10 mg by mouth daily as needed.    [name] tiZANidine (ZANAFLEX) 4 MG tablet Take 1 tablet (4 mg total) by mouth daily. 30 tablet 0  . traMADol (ULTRAM) 50 MG tablet Take 1 tablet (50 mg total) by mouth every 6 (six) hours as needed. 60 tablet 0  . triamcinolone cream (KENALOG) 0.5 % Apply 1 application topically daily as needed.  1  . trolamine salicylate (ASPERCREME) 10 % cream Apply 1 application topically as needed for muscle pain. Reported on 06/01/2016    . varenicline (CHANTIX) 0.5 MG tablet Take 1 tablet (0.5 mg total) by mouth daily. 30 tablet 3  . Vitamin D, Cholecalciferol, 1000 units CAPS Take 1,000 Units by mouth daily.     No current facility-administered medications for this visit.     OBJECTIVE: Middle-aged white woman In no acute distress There were no vitals filed for this visit.   There is no height or weight on file to calculate BMI.    ECOG FS:1 - Symptomatic but completely ambulatory  Sclerae unicteric, pupils round and equal Oropharynx clear and moist-- no thrush or other lesions No cervical or supraclavicular adenopathy Lungs no rales or rhonchi Heart regular rate and rhythm Abd soft, nontender, positive bowel sounds MSK no focal spinal tenderness, no upper extremity lymphedema Neuro: nonfocal, well  oriented, tearful and anxious affect Breasts: The right breast is unremarkable. The left breast is currently undergoing radiation. There is significant desquamation anteriorly, in the inferior mammary fold, and in the axilla. There is no palpable mass.   LAB RESULTS:  CMP     Component Value Date/Time   NA 137 07/07/2016 1135   K 4.4 07/07/2016 1135   CL 106 02/28/2015 1007   CO2 26 07/07/2016 1135   GLUCOSE 102 07/07/2016 1135   BUN 12.2 07/07/2016 1135   CREATININE 1.0 07/07/2016 1135   CALCIUM 9.7 07/07/2016 1135   PROT 6.6 07/07/2016 1135   ALBUMIN 4.0 07/07/2016 1135   AST 14 07/07/2016 1135   ALT 12 07/07/2016 1135  ALKPHOS 52 07/07/2016 1135   BILITOT <0.30 07/07/2016 1135   GFRNONAA 53 (L) 01/24/2015 0843   GFRAA 62 (L) 01/24/2015 0843    INo results found for: SPEP, UPEP  Lab Results  Component Value Date   WBC 5.5 07/07/2016   NEUTROABS 4.2 07/07/2016   HGB 12.0 07/07/2016   HCT 37.1 07/07/2016   MCV 88.8 07/07/2016   PLT 180 07/07/2016      Chemistry      Component Value Date/Time   NA 137 07/07/2016 1135   K 4.4 07/07/2016 1135   CL 106 02/28/2015 1007   CO2 26 07/07/2016 1135   BUN 12.2 07/07/2016 1135   CREATININE 1.0 07/07/2016 1135      Component Value Date/Time   CALCIUM 9.7 07/07/2016 1135   ALKPHOS 52 07/07/2016 1135   AST 14 07/07/2016 1135   ALT 12 07/07/2016 1135   BILITOT <0.30 07/07/2016 1135       No results found for: LABCA2  No components found for: LABCA125  No results for input(s): INR in the last 168 hours.  Urinalysis    Component Value Date/Time   COLORURINE YELLOW 04/18/2013 [place] 04/18/2013 1222   LABSPEC 1.011 04/18/2013 1222   PHURINE 5.5 04/18/2013 1222   GLUCOSEU NEGATIVE 04/18/2013 1222   HGBUR TRACE (A) 04/18/2013 1222   BILIRUBINUR negative 03/28/2015 1321   KETONESUR NEGATIVE 04/18/2013 1222   PROTEINUR negative 03/28/2015 1321   PROTEINUR NEGATIVE 04/18/2013 1222    UROBILINOGEN 0.2 03/28/2015 1321   UROBILINOGEN 0.2 04/18/2013 1222   NITRITE negative 03/28/2015 1321   NITRITE NEGATIVE 04/18/2013 1222   LEUKOCYTESUR Trace 03/28/2015 1321     STUDIES: Ct Head W Wo Contrast  Result Date: 07/08/2016 CLINICAL DATA:  55 year old female with breast cancer. Restaging. Headaches. Subsequent encounter. EXAM: CT HEAD WITHOUT AND WITH CONTRAST TECHNIQUE: Contiguous axial images were obtained from the base of the skull through the vertex without and with intravenous contrast CONTRAST:  174m ISOVUE-300 IOPAMIDOL (ISOVUE-300) in conjunction with contrast enhanced imaging of the CT Abdomen and Pelvis reported separately. COMPARISON:  Whole-body bone scan 07/02/2016. FINDINGS: No osseous abnormality identified. Visible paranasal sinuses and mastoids are clear. Negative visualized orbit and scalp soft tissues. Cerebral volume is normal. No midline shift, ventriculomegaly, intracranial hemorrhage or evidence of cortically based acute infarction. Gray-white matter differentiation is within normal limits throughout the brain. There is a 13 mm enhancing mass in the posterior left cerebellum (series 2, image 9 and series 5, image 35). There might be a second 3-4 mm metastasis in the more inferior left cerebellum (series 5, image 35). There is minimal associated cerebellar edema by CT. No associated mass effect. No other enhancing brain mass identified. Major intracranial vascular structures are enhancing. IMPRESSION: 1. Left cerebellar 13 mm enhancing lesion most compatible with metastatic disease in this setting. Possible additional small 4 mm left cerebellar metastasis. Minimal to mild associated edema and no intracranial mass effect. 2. Brain MRI without and with contrast might detect additional lesions and be valuable for treatment planning. 3. No other acute intracranial abnormality. Electronically Signed   By: HGenevie AnnM.D.   On: 07/08/2016 13:44   Ct Chest W Contrast  Result  Date: 06/17/2016 CLINICAL DATA:  History of left breast cancer. EXAM: CT CHEST WITH CONTRAST TECHNIQUE: Multidetector CT imaging of the chest was performed during intravenous contrast administration. CONTRAST:  746mISOVUE-300 IOPAMIDOL (ISOVUE-300) INJECTION 61% COMPARISON:  03/29/2016 FINDINGS: Mediastinum/Lymph Nodes: There is a left  breast prosthesis is in place. A right chest wall port a catheter is noted with tip in the cavoatrial junction. Normal heart size. Trachea appears patent. Normal appearance of the esophagus. No enlarged mediastinal or hilar lymph nodes. No axillary or supraclavicular adenopathy. Lungs/Pleura: No pleural effusion. Scar like density within the left lower lobe is identified image number 88 of series 6 appears unchanged the previous exam. Subsegmental atelectasis is noted the lung bases. No suspicious pulmonary nodule or mass. Upper abdomen: Scattered low-attenuation foci within the liver are identified. These were present on previous examination likely representing multiple small cysts but are too small to reliably characterize. The adrenal glands are normal. The visualized portions of the pancreas, spleen and kidneys are unremarkable. Musculoskeletal: No aggressive lytic or sclerotic bone lesions. IMPRESSION: 1. Stable CT of the chest. 2. No change left lower lobe scarring. No evidence to suggest residual or recurrent tumor. Electronically Signed   By: Kerby Moors M.D.   On: 06/17/2016 12:31   Nm Bone Scan Whole Body  Result Date: 07/02/2016 CLINICAL DATA:  Primary cancer of lower inner quadrant of left female breast. EXAM: NUCLEAR MEDICINE WHOLE BODY BONE SCAN TECHNIQUE: Whole body anterior and posterior images were obtained approximately 3 hours after intravenous injection of radiopharmaceutical. RADIOPHARMACEUTICALS:  27.0 mCi Technetium-41mMDP IV COMPARISON:  CT scan of March 02, 2016. FINDINGS: Grossly normal uptake is noted throughout the skeleton. No focal abnormalities  are noted. IMPRESSION: No evidence of osseous metastases. Electronically Signed   By: JMarijo Conception M.D.   On: 07/02/2016 14:54   Ct Abdomen Pelvis W Contrast  Result Date: 07/08/2016 CLINICAL DATA:  55year old female with history of breast cancer diagnosed in July 2016 status post mastectomy, chemotherapy and radiation therapy now complete. Restaging examination. EXAM: CT ABDOMEN AND PELVIS WITH CONTRAST TECHNIQUE: Multidetector CT imaging of the abdomen and pelvis was performed using the standard protocol following bolus administration of intravenous contrast. CONTRAST:  1016mISOVUE-300 IOPAMIDOL (ISOVUE-300) INJECTION 61% COMPARISON:  [facility] chest: Linear areas of scarring and atelectasis are noted in the lung bases bilaterally. Postoperative changes of left mastectomy and breast reconstruction are incidentally noted. Mild chronic pleural thickening dependently in the lower thorax bilaterally. Hepatobiliary: Multiple sub cm low-attenuation lesions are noted throughout the liver, similar in size, number and pattern of distribution compared to the prior study 03/02/2016, too small to characterize, but statistically likely to represent tiny cysts. No other larger more suspicious appearing hepatic lesions are noted. No intra or extrahepatic biliary ductal dilatation. Gallbladder is normal in appearance. Pancreas: No pancreatic mass. No pancreatic ductal dilatation. No pancreatic or peripancreatic fluid or inflammatory changes. Spleen: Unremarkable. Adrenals/Urinary Tract: Bilateral adrenal glands bilateral kidneys are normal in appearance. No hydroureteronephrosis. Urinary bladder is normal in appearance. Stomach/Bowel: Normal appearance of the stomach. No pathologic dilatation of small bowel or colon. Normal appendix. Vascular/Lymphatic: No significant atherosclerotic disease, aneurysm or dissection identified in  the abdominal or pelvic vasculature. No lymphadenopathy noted in the abdomen or pelvis. Reproductive: Uterus and ovaries are normal in appearance. Other: No significant volume of ascites.  No pneumoperitoneum. Musculoskeletal: There are no aggressive appearing lytic or blastic lesions noted in the visualized portions of the skeleton. IMPRESSION: 1. No findings to suggest metastatic disease in the abdomen or pelvis. 2. No acute findings in the abdomen or pelvis. 3. Normal appendix. 4. Additional incidental findings, as above. Electronically Signed   By: DaQuillian Quince  Entrikin M.D.   On: 07/08/2016 11:57     ELIGIBLE FOR AVAILABLE RESEARCH PROTOCOL: no  ASSESSMENT: 55 y.o. [place] woman with stage IV left-sided breast cancer involving bone, possibly also liver and lung   (1) s/p left breast lower inner quadrant biopsy 06/19/2015 for a clinical T2-3 NX invasive ductal carcinoma, grade 2, triple positive.  (2) status post left mastectomy and axillary lymph node dissection  with immediate expander placement 07/18/2015 for an mpT4 pN2,stage IIIB invasive ductal carcinoma, grade 3, with negative margins.  (a) definitive implant exchange to be scheduled in December   METASTATIC DISEASE: October 2016  (3) CT scan of the chest abdomen and pelvis  09/16/2015 shows metastatic lesions in the right scapula, left iliac crest, L4, and T spine. There were questionable liver cysts, with repeat CT scan 03/02/2016 showing possible right upper lobe lung lesions and possibly increased liver lesions  (a) CT scan of the chest shows no measurable disease in the lungs or liver  (b) Bone scan July 2017 pending  (4) received docetaxel every 3 weeks 6 together with trastuzumab and pertuzumab, last docetaxel dose 02/11/2016  (5) adjuvant radiation started 05/08/2016, to be completed 06/26/2016  (6) started trastuzumab and pertuzumab October 2016, continuing every 3 weeks,  (a) most recent echocardiogram 02/26/2016 showed a  well preserved ejection fraction  (b) EP echocardiogram 07/01/2016 pending  (7) started the denosumab/Xgeva October 2017, repeated every 6 weeks  (8) to start anti-estrogen therapy at the completion of radiation  (9) history of bipolar disorder  (a) lithium level checked every 6 weeks  (10) mild anemia with a significant drop in the MCV, ferritin 10 06/03/2016,   (a) Feraheme given 06/12/2016 and 06/18/2016  (11) tobacco abuse: Chantix started 06/18/2016  PLAN: Carter is tolerating her treatment well, with essentially no side effects. That includes her anti-HER-2 treatments, her denosumab, and the Feraheme that she received last week and will receive again today.  We discussed her chest CT scan in detail. This is very favorable. She has a bone scan scheduled for next week.  She had been scheduled for an echocardiogram before today but was unable to do it because of significant desquamation from her radiation treatments. She is scheduled again the first week in August. I have urged her to make sure to keep that appointment.  She will have her next anti-HER-2 immunotherapy 07/07/2016 and she will receive that every 3 weeks indefinitely. She will have a lithium level checked every 6 weeks.  She feels very lost and without support. I am asking our navigators to give her a call.  She requested something to help her quit smoking. We are going to try Chantix, low dose. She is aware of the possible toxicities, side effects and complications of this medication NA she finds it causes significant mood changes she will call us. We are also updating some of her other medications today  At this point I feel Zakiya is "on track" and barring any surprises from the upcoming bone scan the plan will be to continue the current treatment indefinitely on until there is evidence of disease progression.  She knows to call for any problems that may develop before her next visit here.   Chauncey Cruel, MD    07/08/2016 2:14 PM Medical Oncology and Hematology [facility] [address] [place], [place] [address] Tel. [phone]    Fax. [phone]

## 2016-07-10 NOTE — Progress Notes (Signed)
°  Radiation Oncology         [phone] ________________________________  Name: [name] MRN: [id number]  Date: 06/26/2016  DOB: [birth year]  End of Treatment Note  Diagnosis:      ICD-9-CM ICD-10-CM   1. Primary cancer of lower-inner quadrant of left female breast ([place]) 174.3 C50.312        Indication for treatment:  Curative       Radiation treatment dates:   06/03/2016 to 06/26/2016  Site/dose:    1. The Left chest wall was treated to 23.4 Gy in 13 fractions at 1.8 Gy per fraction. 2. The Left chest wall was boosted to 10 Gy in 5 fractions at 2 Gy per fraction. 3. The Left Sclav/PAB was treated to 23.4 Gy in 13 fractions at 1.8 Gy per fraction.  Including the patient's treatment in [place], the patient received 50.4 Gy to the left chest wall and supraclavicular region.  Beams/energy:    1. 3D // 10X, 6X 2. En face // 6 MeV 3. 3D // 10X, 6X  Narrative: The patient tolerated radiation treatment relatively well.   She experienced skin tightness, dryness, and peeling in the treatment area; relieved with Silvadene use.   Plan: The patient has completed radiation treatment. The patient will return to radiation oncology clinic for routine followup in one month. I advised them to call or return sooner if they have any questions or concerns related to their recovery or treatment.  ------------------------------------------------  Jodelle Gross, MD, PhD  This document serves as a record of services personally performed by Kyung Rudd, MD. It was created on his behalf by Arlyce Harman, a trained medical scribe. The creation of this record is based on the scribe's personal observations and the provider's statements to them. This document has been checked and approved by the attending provider.

## 2016-07-14 ENCOUNTER — Other Ambulatory Visit: Payer: Self-pay | Admitting: *Deleted

## 2016-07-14 ENCOUNTER — Telehealth: Payer: Self-pay | Admitting: *Deleted

## 2016-07-14 DIAGNOSIS — J302 Other seasonal allergic rhinitis: Secondary | ICD-10-CM

## 2016-07-14 DIAGNOSIS — R110 Nausea: Secondary | ICD-10-CM

## 2016-07-14 MED ORDER — LORAZEPAM 0.5 MG PO TABS: 0.5000 mg | ORAL_TABLET | Freq: Three times a day (TID) | ORAL | 0 refills | Status: DC

## 2016-07-14 MED ORDER — DIPHENOXYLATE-ATROPINE 2.5-0.025 MG PO TABS: 2.0000 | ORAL_TABLET | Freq: Four times a day (QID) | ORAL | 0 refills | Status: DC | PRN

## 2016-07-14 MED ORDER — CETIRIZINE HCL 10 MG PO TABS: 10.0000 mg | ORAL_TABLET | Freq: Every day | ORAL | 11 refills | Status: DC

## 2016-07-14 MED ORDER — TRAZODONE HCL 300 MG PO TABS: 300.0000 mg | ORAL_TABLET | Freq: Every day | ORAL | 3 refills | Status: DC

## 2016-07-14 MED ORDER — ONDANSETRON HCL 8 MG PO TABS: 8.0000 mg | ORAL_TABLET | Freq: Three times a day (TID) | ORAL | 1 refills | Status: DC | PRN

## 2016-07-14 MED FILL — traZODone HCL 150 MG TABS: 150 | 30 days supply | Qty: 60 | Fill #0

## 2016-07-14 MED FILL — ALL DAY ALLERGY 10 MG TAB: 10 | 100 days supply | Qty: 100 | Fill #0

## 2016-07-14 MED FILL — ONDANSETRON HCL 8 MG TABLET: 8 | 10 days supply | Qty: 30 | Fill #0

## 2016-07-14 NOTE — Telephone Encounter (Signed)
This RN spoke with pt per her call needing refills and per concern with increased pain with intercourse.  Refills sent to [facility] per resource funds.  Per discussion regarding pelvic health and intercourse- Kayan states she has had 2 attempts for intercourse " and both times we had to stop and withdrawal because it was too painful "  Arie states she did not use any lubrication with above scenarios.  Plan per discussion- Brexlyn will institute use of coconut oil daily to vagina including manual internal massage. She may also use a vaginal dilator for additional massage for gentle stretching of vaginal walls with the coconut oil.  She will institute lubrication with any sexual activity including need for internal massage prior to intercourse.  This RN offered referral to PT for pelvic health with [facility] stating she would like to try above for 2 weeks for benefit- if not successful will let this RN know.  Of note - Aela stated appreciation of call and discussion last week post receiving news of area in brain needing further work up for cancer.  Presently Izamary states she feels good and " have a lot of people praying for me ".  This RN validated pt's feelings and appreciation.  No other needs at this time.

## 2016-07-15 MED FILL — LORazepam 0.5 MG TABS: 0.5 | 10 days supply | Qty: 30 | Fill #0

## 2016-07-15 MED FILL — DIPHENOXYLATE-ATROPINE TAB: 2.5-0.025 | 5 days supply | Qty: 40 | Fill #0

## 2016-07-16 ENCOUNTER — Other Ambulatory Visit: Payer: Self-pay | Admitting: Oncology

## 2016-07-16 ENCOUNTER — Ambulatory Visit (HOSPITAL_BASED_OUTPATIENT_CLINIC_OR_DEPARTMENT_OTHER): Payer: Medicaid Other

## 2016-07-16 VITALS — BP 111/52 | HR 77 | Temp 98.2°F | Resp 20

## 2016-07-16 DIAGNOSIS — C50912 Malignant neoplasm of unspecified site of left female breast: Secondary | ICD-10-CM

## 2016-07-16 DIAGNOSIS — C7951 Secondary malignant neoplasm of bone: Secondary | ICD-10-CM | POA: Diagnosis not present

## 2016-07-16 DIAGNOSIS — C773 Secondary and unspecified malignant neoplasm of axilla and upper limb lymph nodes: Secondary | ICD-10-CM | POA: Diagnosis not present

## 2016-07-16 DIAGNOSIS — C50312 Malignant neoplasm of lower-inner quadrant of left female breast: Secondary | ICD-10-CM

## 2016-07-16 MED ORDER — DENOSUMAB 120 MG/1.7ML ~~LOC~~ SOLN
120.0000 mg | Freq: Once | SUBCUTANEOUS | Status: AC
Start: 2016-07-16 — End: 2016-07-16
  Administered 2016-07-16: 120 mg via SUBCUTANEOUS
  Filled 2016-07-16: qty 1.7

## 2016-07-16 NOTE — Patient Instructions (Signed)
Denosumab injection  What is this medicine?  DENOSUMAB (den oh sue mab) slows bone breakdown. Prolia is used to treat osteoporosis in women after menopause and in men. Xgeva is used to prevent bone fractures and other bone problems caused by cancer bone metastases. Xgeva is also used to treat giant cell tumor of the bone.  This medicine may be used for other purposes; ask your health care provider or pharmacist if you have questions.  What should I tell my health care provider before I take this medicine?  They need to know if you have any of these conditions:  -dental disease  -eczema  -infection or history of infections  -kidney disease or on dialysis  -low blood calcium or vitamin D  -malabsorption syndrome  -scheduled to have surgery or tooth extraction  -taking medicine that contains denosumab  -thyroid or parathyroid disease  -an unusual reaction to denosumab, other medicines, foods, dyes, or preservatives  -pregnant or trying to get pregnant  -breast-feeding  How should I use this medicine?  This medicine is for injection under the skin. It is given by a health care professional in a hospital or clinic setting.  If you are getting Prolia, a special MedGuide will be given to you by the pharmacist with each prescription and refill. Be sure to read this information carefully each time.  For Prolia, talk to your pediatrician regarding the use of this medicine in children. Special care may be needed. For Xgeva, talk to your pediatrician regarding the use of this medicine in children. While this drug may be prescribed for children as young as 13 years for selected conditions, precautions do apply.  Overdosage: If you think you have taken too much of this medicine contact a poison control center or emergency room at once.  NOTE: This medicine is only for you. Do not share this medicine with others.  What if I miss a dose?  It is important not to miss your dose. Call your doctor or health care professional if you are  unable to keep an appointment.  What may interact with this medicine?  Do not take this medicine with any of the following medications:  -other medicines containing denosumab  This medicine may also interact with the following medications:  -medicines that suppress the immune system  -medicines that treat cancer  -steroid medicines like prednisone or cortisone  This list may not describe all possible interactions. Give your health care provider a list of all the medicines, herbs, non-prescription drugs, or dietary supplements you use. Also tell them if you smoke, drink alcohol, or use illegal drugs. Some items may interact with your medicine.  What should I watch for while using this medicine?  Visit your doctor or health care professional for regular checks on your progress. Your doctor or health care professional may order blood tests and other tests to see how you are doing.  Call your doctor or health care professional if you get a cold or other infection while receiving this medicine. Do not treat yourself. This medicine may decrease your body's ability to fight infection.  You should make sure you get enough calcium and vitamin D while you are taking this medicine, unless your doctor tells you not to. Discuss the foods you eat and the vitamins you take with your health care professional.  See your dentist regularly. Brush and floss your teeth as directed. Before you have any dental work done, tell your dentist you are receiving this medicine.  Do   not become pregnant while taking this medicine or for 5 months after stopping it. Women should inform their doctor if they wish to become pregnant or think they might be pregnant. There is a potential for serious side effects to an unborn child. Talk to your health care professional or pharmacist for more information.  What side effects may I notice from receiving this medicine?  Side effects that you should report to your doctor or health care professional as soon as  possible:  -allergic reactions like skin rash, itching or hives, swelling of the face, lips, or tongue  -breathing problems  -chest pain  -fast, irregular heartbeat  -feeling faint or lightheaded, falls  -fever, chills, or any other sign of infection  -muscle spasms, tightening, or twitches  -numbness or tingling  -skin blisters or bumps, or is dry, peels, or red  -slow healing or unexplained pain in the mouth or jaw  -unusual bleeding or bruising  Side effects that usually do not require medical attention (Report these to your doctor or health care professional if they continue or are bothersome.):  -muscle pain  -stomach upset, gas  This list may not describe all possible side effects. Call your doctor for medical advice about side effects. You may report side effects to FDA at 1-800-FDA-1088.  Where should I keep my medicine?  This medicine is only given in a clinic, doctor's office, or other health care setting and will not be stored at home.  NOTE: This sheet is a summary. It may not cover all possible information. If you have questions about this medicine, talk to your doctor, pharmacist, or health care provider.      2016, Elsevier/Gold Standard. (2012-05-16 12:37:47)

## 2016-07-17 ENCOUNTER — Other Ambulatory Visit (HOSPITAL_COMMUNITY): Payer: Self-pay | Admitting: *Deleted

## 2016-07-17 NOTE — Pre-Procedure Instructions (Signed)
    [name]  07/17/2016      [place], [place] - [address] [place] [address] Phone: [phone] Fax: [phone]    Your procedure is scheduled on Thursday, July 23, 2016 at 8:00 AM.   Report to [facility] Entrance "A" Admitting Office at 6:00 AM.   Call this number if you have problems the morning of surgery: [phone]   Any questions prior to day of surgery, please call [phone] between 8 & 4 PM.   Remember:  Do not eat food or drink liquids after midnight Wednesday, 07/22/16.  Take these medicines the morning of procedure with A SIP OF WATER: Cetirizine (Zyrtec), Gabapentin (Neurontin), Lamotrigine (Lamictal), Lithium, Lorazepam (Ativan), Pantoprazole (Protonix), Pregabalin (Lyrica), Tramadol - if needed   Do not wear jewelry, make-up or nail polish.  Do not wear lotions, powders, or perfumes.  You may wear deodorant.  Do not shave 48 hours prior to surgery.   Do not bring valuables to the hospital.  [facility] is not responsible for any belongings or valuables.  Contacts, dentures or bridgework may not be worn into surgery.  Leave your suitcase in the car.  After surgery it may be brought to your room.  For patients admitted to the hospital, discharge time will be determined by your treatment team.  Patients discharged the day of surgery will not be allowed to drive home.

## 2016-07-20 ENCOUNTER — Encounter (HOSPITAL_COMMUNITY): Payer: Self-pay

## 2016-07-20 ENCOUNTER — Encounter (HOSPITAL_COMMUNITY)
Admission: RE | Admit: 2016-07-20 | Discharge: 2016-07-20 | Disposition: A | Discharge status: Home / Self Care | Payer: Medicaid Other | Source: Ambulatory Visit | Attending: Radiation Oncology | Admitting: Radiation Oncology

## 2016-07-20 DIAGNOSIS — Z01812 Encounter for preprocedural laboratory examination: Secondary | ICD-10-CM | POA: Insufficient documentation

## 2016-07-20 DIAGNOSIS — C50912 Malignant neoplasm of unspecified site of left female breast: Secondary | ICD-10-CM | POA: Diagnosis not present

## 2016-07-20 HISTORY — DX: Gastro-esophageal reflux disease without esophagitis: K21.9

## 2016-07-20 HISTORY — DX: Headache, unspecified: R51.9

## 2016-07-20 HISTORY — DX: Headache: R51

## 2016-07-20 HISTORY — DX: Malignant (primary) neoplasm, unspecified: C80.1

## 2016-07-20 LAB — BASIC METABOLIC PANEL
Anion gap: 6 (ref 5–15)
BUN: 8 mg/dL (ref 6–20)
CO2: 29 mmol/L (ref 22–32)
Calcium: 10.1 mg/dL (ref 8.9–10.3)
Chloride: 105 mmol/L (ref 101–111)
Creatinine, Ser: 0.99 mg/dL (ref 0.44–1.00)
GFR calc Af Amer: >60 mL/min (ref >60)
GFR calc non Af Amer: >60 mL/min (ref >60)
Glucose, Bld: 69 mg/dL (ref 65–99)
Potassium: 4.5 mmol/L (ref 3.5–5.1)
Sodium: 140 mmol/L (ref 135–145)

## 2016-07-20 LAB — CBC
HCT: 42.9 % (ref 36.0–46.0)
Hemoglobin: 13.3 g/dL (ref 12.0–15.0)
MCH: 29.6 pg (ref 26.0–34.0)
MCHC: 31 g/dL (ref 30.0–36.0)
MCV: 95.3 fL (ref 78.0–100.0)
Platelets: 194 10*3/uL (ref 150–400)
RBC: 4.5 MIL/uL (ref 3.87–5.11)
RDW: 17.3 % — ABNORMAL HIGH (ref 11.5–15.5)
WBC: 5 10*3/uL (ref 4.0–10.5)

## 2016-07-23 ENCOUNTER — Ambulatory Visit (HOSPITAL_COMMUNITY): Payer: Medicaid Other | Admitting: Certified Registered Nurse Anesthetist

## 2016-07-23 ENCOUNTER — Ambulatory Visit (HOSPITAL_COMMUNITY)
Admission: RE | Admit: 2016-07-23 | Discharge: 2016-07-23 | Disposition: A | Discharge status: Home / Self Care | Payer: Medicaid Other | Source: Ambulatory Visit | Attending: Radiation Oncology | Admitting: Radiation Oncology

## 2016-07-23 ENCOUNTER — Encounter (HOSPITAL_COMMUNITY)
Admission: RE | Disposition: A | Discharge status: Home / Self Care | Payer: Self-pay | Source: Ambulatory Visit | Attending: Radiation Oncology

## 2016-07-23 ENCOUNTER — Encounter (HOSPITAL_COMMUNITY): Payer: Self-pay | Admitting: *Deleted

## 2016-07-23 DIAGNOSIS — F319 Bipolar disorder, unspecified: Secondary | ICD-10-CM | POA: Insufficient documentation

## 2016-07-23 DIAGNOSIS — Z853 Personal history of malignant neoplasm of breast: Secondary | ICD-10-CM | POA: Diagnosis not present

## 2016-07-23 DIAGNOSIS — Z923 Personal history of irradiation: Secondary | ICD-10-CM | POA: Diagnosis not present

## 2016-07-23 DIAGNOSIS — G939 Disorder of brain, unspecified: Secondary | ICD-10-CM | POA: Diagnosis present

## 2016-07-23 DIAGNOSIS — M797 Fibromyalgia: Secondary | ICD-10-CM | POA: Diagnosis not present

## 2016-07-23 DIAGNOSIS — F418 Other specified anxiety disorders: Secondary | ICD-10-CM | POA: Diagnosis not present

## 2016-07-23 DIAGNOSIS — C50912 Malignant neoplasm of unspecified site of left female breast: Secondary | ICD-10-CM

## 2016-07-23 DIAGNOSIS — Z9221 Personal history of antineoplastic chemotherapy: Secondary | ICD-10-CM | POA: Diagnosis not present

## 2016-07-23 DIAGNOSIS — K219 Gastro-esophageal reflux disease without esophagitis: Secondary | ICD-10-CM | POA: Diagnosis not present

## 2016-07-23 HISTORY — PX: RADIOLOGY WITH ANESTHESIA: SHX6223

## 2016-07-23 SURGERY — RADIOLOGY WITH ANESTHESIA
Anesthesia: General

## 2016-07-23 MED ORDER — LACTATED RINGERS IV SOLN
INTRAVENOUS | Status: DC
  Administered 2016-07-23 (×3): via INTRAVENOUS

## 2016-07-23 MED ORDER — MIDAZOLAM HCL 5 MG/5ML IJ SOLN
INTRAMUSCULAR | Status: DC | PRN
  Administered 2016-07-23: 2 mg via INTRAVENOUS

## 2016-07-23 MED ORDER — GADOBENATE DIMEGLUMINE 529 MG/ML IV SOLN
15.0000 mL | Freq: Once | INTRAVENOUS | Status: AC
  Administered 2016-07-23: 14 mL via INTRAVENOUS

## 2016-07-23 MED ORDER — MIDAZOLAM HCL 5 MG/ML IJ SOLN: 2.0000 mg | Freq: Once | INTRAMUSCULAR | Status: DC

## 2016-07-23 MED ORDER — MIDAZOLAM HCL 2 MG/2ML IJ SOLN
INTRAMUSCULAR | Status: AC
  Administered 2016-07-23: 1 mg
  Filled 2016-07-23: qty 2

## 2016-07-23 NOTE — Anesthesia Preprocedure Evaluation (Addendum)
Anesthesia Evaluation  Patient identified by MRN, date of birth, ID band  Reviewed: Allergy & Precautions, NPO status , Patient's Chart, lab work & pertinent test results  Airway Mallampati: I  TM Distance: >3 FB Neck ROM: Full    Dental  (+) Dental Advisory Given, Chipped,    Pulmonary Current Smoker,    Pulmonary exam normal        Cardiovascular negative cardio ROS Normal cardiovascular exam     Neuro/Psych  Headaches, PSYCHIATRIC DISORDERS Anxiety Depression Bipolar Disorder    GI/Hepatic Neg liver ROS, GERD  Medicated,  Endo/Other    Renal/GU negative Renal ROS     Musculoskeletal  (+) Fibromyalgia -  Abdominal   Peds  Hematology  (+) anemia ,   Anesthesia Other Findings Breast cancer s/p chemo/radiation with suspected mets to the brain  Reproductive/Obstetrics                            Anesthesia Physical Anesthesia Plan  ASA: III  Anesthesia Plan: General   Post-op Pain Management:    Induction: Intravenous  Airway Management Planned: Oral ETT  Additional Equipment:   Intra-op Plan:   Post-operative Plan:   Informed Consent: I have reviewed the patients History and Physical, chart, labs and discussed the procedure including the risks, benefits and alternatives for the proposed anesthesia with the patient or authorized representative who has indicated his/her understanding and acceptance.   Dental advisory given  Plan Discussed with: CRNA and Surgeon  Anesthesia Plan Comments:         Anesthesia Quick Evaluation

## 2016-07-23 NOTE — Anesthesia Postprocedure Evaluation (Signed)
Anesthesia Post Note  Patient: [name]  Procedure(s) Performed: Procedure(s) (LRB): MRI OF BRAIN WITH AND WITHOUT (N/A)  Patient location during evaluation: PACU Anesthesia Type: General Level of consciousness: awake and alert Pain management: pain level controlled Vital Signs Assessment: post-procedure vital signs reviewed and stable Respiratory status: spontaneous breathing, nonlabored ventilation, respiratory function stable and patient connected to nasal cannula oxygen Cardiovascular status: blood pressure returned to baseline and stable Postop Assessment: no signs of nausea or vomiting Anesthetic complications: no    Last Vitals:  Vitals:   07/23/16 1045 07/23/16 1050  BP:  (!) 106/92  Pulse: 92 92  Resp: 15 16  Temp:      Last Pain:  Vitals:   07/23/16 1036  TempSrc:   PainSc: 0-No pain                 Reginal Lutes

## 2016-07-23 NOTE — Anesthesia Procedure Notes (Signed)
Procedure Name: Intubation Date/Time: 07/23/2016 8:55 AM Performed by: Reginal Lutes Pre-anesthesia Checklist: Patient identified, Emergency Drugs available, Suction available, Patient being monitored and Timeout performed Patient Re-evaluated:Patient Re-evaluated prior to inductionOxygen Delivery Method: Circle system utilized Preoxygenation: Pre-oxygenation with 100% oxygen Intubation Type: IV induction Ventilation: Mask ventilation without difficulty Laryngoscope Size: Mac and 3 Grade View: Grade II Tube type: Oral Tube size: 7.0 mm Number of attempts: 2 (first attempt by CRNA unable to pass tube due to poor view. 2nd attempt by anesthesiologist with grade 2b view, passed atraumatically through cords. anterior larynx) Airway Equipment and Method: Stylet Placement Confirmation: ETT inserted through vocal cords under direct vision,  CO2 detector,  breath sounds checked- equal and bilateral and positive ETCO2 Secured at: 23 cm Dental Injury: Teeth and Oropharynx as per pre-operative assessment

## 2016-07-23 NOTE — Transfer of Care (Signed)
Immediate Anesthesia Transfer of Care Note  Patient: [name]  Procedure(s) Performed: Procedure(s): MRI OF BRAIN WITH AND WITHOUT (N/A)  Patient Location: PACU  Anesthesia Type:General  Level of Consciousness: awake, alert  and oriented  Airway & Oxygen Therapy: Patient Spontanous Breathing and Patient connected to nasal cannula oxygen  Post-op Assessment: Report given to RN, Post -op Vital signs reviewed and stable and Patient moving all extremities X 4  Post vital signs: Reviewed and stable  Last Vitals:  Vitals:   07/23/16 0631  BP: 117/63  Pulse: 75  Resp: 20  Temp: 36.6 C    Last Pain:  Vitals:   07/23/16 0631  TempSrc: Oral      Patients Stated Pain Goal: 3 ([id number])  Complications: No apparent anesthesia complications

## 2016-07-24 ENCOUNTER — Encounter (HOSPITAL_COMMUNITY): Payer: Self-pay | Admitting: Radiology

## 2016-07-24 MED FILL — Propofol IV Emul 200 MG/20ML (10 MG/ML): INTRAVENOUS | Qty: 20 | Status: AC

## 2016-07-24 MED FILL — Fentanyl Citrate Preservative Free (PF) Inj 100 MCG/2ML: INTRAMUSCULAR | Qty: 2 | Status: AC

## 2016-07-24 MED FILL — Lactated Ringer's Solution: INTRAVENOUS | Qty: 1000 | Status: AC

## 2016-07-24 MED FILL — Succinylcholine Chloride Inj 20 MG/ML: INTRAMUSCULAR | Qty: 10 | Status: AC

## 2016-07-24 MED FILL — Midazolam HCl Inj 2 MG/2ML (Base Equivalent): INTRAMUSCULAR | Qty: 2 | Status: AC

## 2016-07-24 MED FILL — Ephedrine Sulfate Inj 50 MG/ML: INTRAMUSCULAR | Qty: 1 | Status: AC

## 2016-07-24 MED FILL — Phenylephrine-NaCl Pref Syr 0.4 MG/10ML-0.9% (40 MCG/ML): INTRAVENOUS | Qty: 10 | Status: AC

## 2016-07-27 ENCOUNTER — Encounter: Payer: Self-pay | Admitting: Radiation Oncology

## 2016-07-27 ENCOUNTER — Ambulatory Visit
Admission: RE | Admit: 2016-07-27 | Discharge: 2016-07-27 | Disposition: A | Discharge status: Home / Self Care | Payer: Medicaid Other | Source: Ambulatory Visit | Attending: Radiation Oncology | Admitting: Radiation Oncology

## 2016-07-27 ENCOUNTER — Encounter: Payer: Self-pay | Admitting: *Deleted

## 2016-07-27 DIAGNOSIS — Z79899 Other long term (current) drug therapy: Secondary | ICD-10-CM | POA: Insufficient documentation

## 2016-07-27 DIAGNOSIS — Z9889 Other specified postprocedural states: Secondary | ICD-10-CM | POA: Insufficient documentation

## 2016-07-27 DIAGNOSIS — M25519 Pain in unspecified shoulder: Secondary | ICD-10-CM | POA: Diagnosis not present

## 2016-07-27 DIAGNOSIS — M1990 Unspecified osteoarthritis, unspecified site: Secondary | ICD-10-CM | POA: Insufficient documentation

## 2016-07-27 DIAGNOSIS — Z8589 Personal history of malignant neoplasm of other organs and systems: Secondary | ICD-10-CM | POA: Insufficient documentation

## 2016-07-27 DIAGNOSIS — F4310 Post-traumatic stress disorder, unspecified: Secondary | ICD-10-CM | POA: Insufficient documentation

## 2016-07-27 DIAGNOSIS — K219 Gastro-esophageal reflux disease without esophagitis: Secondary | ICD-10-CM | POA: Diagnosis not present

## 2016-07-27 DIAGNOSIS — M549 Dorsalgia, unspecified: Secondary | ICD-10-CM | POA: Insufficient documentation

## 2016-07-27 DIAGNOSIS — C50912 Malignant neoplasm of unspecified site of left female breast: Secondary | ICD-10-CM | POA: Diagnosis not present

## 2016-07-27 DIAGNOSIS — F319 Bipolar disorder, unspecified: Secondary | ICD-10-CM | POA: Diagnosis not present

## 2016-07-27 DIAGNOSIS — F17210 Nicotine dependence, cigarettes, uncomplicated: Secondary | ICD-10-CM | POA: Insufficient documentation

## 2016-07-27 DIAGNOSIS — G893 Neoplasm related pain (acute) (chronic): Secondary | ICD-10-CM | POA: Insufficient documentation

## 2016-07-27 DIAGNOSIS — R51 Headache: Secondary | ICD-10-CM | POA: Insufficient documentation

## 2016-07-27 DIAGNOSIS — C7931 Secondary malignant neoplasm of brain: Secondary | ICD-10-CM | POA: Diagnosis not present

## 2016-07-27 DIAGNOSIS — Z833 Family history of diabetes mellitus: Secondary | ICD-10-CM | POA: Diagnosis not present

## 2016-07-27 DIAGNOSIS — M797 Fibromyalgia: Secondary | ICD-10-CM | POA: Insufficient documentation

## 2016-07-27 DIAGNOSIS — Z85841 Personal history of malignant neoplasm of brain: Secondary | ICD-10-CM | POA: Insufficient documentation

## 2016-07-27 DIAGNOSIS — Z510 Encounter for antineoplastic radiation therapy: Secondary | ICD-10-CM | POA: Insufficient documentation

## 2016-07-27 NOTE — Progress Notes (Signed)
Re consult Metastatic stage IV T4 N3M1,  Breast cancer now Brain Mets Radiation therapy on Left Chest Wall and lymph nodes, from Dr. Maryan Rued near [place], [place].   c/o headache  Frontal , behind her eyes, on back of neck and shoulder, 5/10 score, no c/o nausea now,  BP 123/60 (BP Location: Right Arm, Patient Position: Sitting, Cuff Size: Normal)   Pulse 84   Temp 98.5 F (36.9 C) (Oral)   Ht 5\' 6"  (1.676 m)   Wt 155 lb 8 oz (70.5 kg)   BMI 25.10 kg/m   Wt Readings from Last 3 Encounters:  07/27/16 155 lb 8 oz (70.5 kg)  07/27/16 155 lb 8 oz (70.5 kg)  07/23/16 154 lb (69.9 kg)

## 2016-07-27 NOTE — Progress Notes (Signed)
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 The Left chest wall was treated to 23.4 Gy in 13 fractions at 1.8 Gy per fraction. 2. The Left chest wall was boosted to 10 Gy in 5 fractions at 2 Gy per fraction. 3. The Left Sclav/PAB was treated to 23.4 Gy in 13 fractions at 1.8 Gy per fraction.  Including the patient's treatment in [place] (received 15 fractions per Dr. Maryan Rued near [place], [place]), the patient received 50.4 Gy to the left chest wall and supraclavicular region.  Narrative:  The patient returns today for a reconsultation. Bone scan on 07/02/16 showed no evidence of osseous metastases. The patient had constant headaches and had a CT of the head on 07/08/16. This showed a 1.3 cm enhancing mass in the posterior left cerebellum, indication of a second 0.3-0.4 cm metastasis in the more inferior left cerebellum, and minimal associated cerebella edema. CT of the abd/pelvis the same day showed no evidence of metastatic disease in the abd/pelvis. On 07/01/16, the patient had a brain MRI. This showed a 1.4 x 2.4 x 1.2 cm mass along the posterior left cerebellar hemisphere and no other masses were identified. The 0.4 cm left cerebellar lesion noted by CT was an artifact.  The patient returns today to discuss the role of radiation to this brain metastasis. She is still receiving Herceptin/Pertuzumab under Dr. [name].  On review of systems, the patient reports a frontal headache and back and shoulder pain. She has no complaints of nausea at this time. She also has anxiety.  Past Medical History:  Past Medical History:  Diagnosis Date  . Alcohol abuse    . Anxiety    At age 30  . Arthritis Dx 2010  . Bipolar disorder ([place])   . Cancer (HCC)    breast  . Chronic pain   . Depression   . Fibromyalgia Dx 2005  . GERD (gastroesophageal reflux disease)   . Headache    hx  migraines  . Opiate dependence ([place])   . PTSD (post-traumatic stress disorder)     Past Surgical History: Past Surgical History:  Procedure Laterality Date  . FIBULA FRACTURE SURGERY    . RADIOLOGY WITH ANESTHESIA N/A 07/23/2016   Procedure: MRI OF BRAIN WITH AND WITHOUT;  Surgeon: Medication Radiologist, MD;  Location: [facility];  Service: Radiology;  Laterality: N/A;    Social History:  Social History   Social History  . Marital status: Single    Spouse name: N/A  . Number of children: N/A  . Years of education: N/A   Occupational History  . Not on file.   Social History Main Topics  . Smoking status: Current Every Day Smoker    Packs/day: 1.00    Types: Cigarettes  . Smokeless tobacco: Never Used  . Alcohol use Yes     Comment: no ETOH since May  . Drug use: No     Comment: prescription opiates from the street daily until Sunday  . Sexual activity: No     Comment: ablation   Other Topics Concern  . Not on file   Social History Narrative  . No narrative on file  Family History: Family History  Problem Relation Age of Onset  . Diabetes Mother     ALLERGIES:  is allergic to demerol and erythromycin.  Meds: Current Outpatient Prescriptions  Medication Sig Dispense Refill  . cetirizine (ZYRTEC) 10 MG tablet Take 1 tablet (10 mg total) by mouth daily. 30 tablet 11  . diphenoxylate-atropine (LOMOTIL) 2.5-0.025 MG tablet Take 2 tablets by mouth 4 (four) times daily as needed for diarrhea or loose stools. 40 tablet 0  . fluticasone (FLONASE) 50 MCG/ACT nasal spray Place 1 spray into the nose daily.    [name] gabapentin (NEURONTIN) 300 MG capsule Take 2 capsules (600 mg total) by mouth 3 (three) times daily. 300 mg at night for one day, twice daily  for one day, then three times daily 180 capsule 2  . hyaluronate sodium (RADIAPLEXRX) GEL Apply 1 application topically.    . lamoTRIgine (LAMICTAL) 100 MG tablet Take 1.5 tablets (150 mg total) by mouth 2 (two) times daily. 60 tablet 3  . lidocaine-prilocaine (EMLA) cream Apply 1 application topically as needed. 30 g 0  . lithium carbonate 300 MG capsule Take 1 capsule (300 mg total) by mouth 2 (two) times daily with a meal. 60 capsule 3  . LORazepam (ATIVAN) 0.5 MG tablet Take 1 tablet (0.5 mg total) by mouth every 8 (eight) hours. 30 tablet 0  . non-metallic deodorant (ALRA) MISC Apply 1 application topically daily as needed.    . ondansetron (ZOFRAN) 8 MG tablet Take 1 tablet (8 mg total) by mouth every 8 (eight) hours as needed for nausea or vomiting. 30 tablet 1  . pantoprazole (PROTONIX) 40 MG tablet Take 1 tablet (40 mg total) by mouth daily. 30 tablet 3  . polyethylene glycol powder (GLYCOLAX/MIRALAX) powder Take 17 g by mouth 2 (two) times daily as needed for mild constipation. (Patient not taking: Reported on 07/27/2016) 3350 g 3  . potassium chloride (K-DUR) 10 MEQ tablet Take 1 tablet (10 mEq total) by mouth daily. 30 tablet 3  . prochlorperazine (COMPAZINE) 10 MG tablet Take 10 mg by mouth daily as needed.    [name] tiZANidine (ZANAFLEX) 4 MG tablet Take 1 tablet (4 mg total) by mouth daily. 30 tablet 0  . traMADol (ULTRAM) 50 MG tablet Take 1 tablet (50 mg total) by mouth every 6 (six) hours as needed. 60 tablet 0  . trazodone (DESYREL) 300 MG tablet Take 1 tablet (300 mg total) by mouth at bedtime. 30 tablet 3  . triamcinolone cream (KENALOG) 0.5 % Apply 1 application topically daily as needed.  1  . trolamine salicylate (ASPERCREME) 10 % cream Apply 1 application topically as needed for muscle pain. Reported on 06/01/2016    . varenicline (CHANTIX) 0.5 MG tablet Take 1 tablet (0.5 mg total) by mouth daily. 30 tablet 3  . Vitamin D, Cholecalciferol, 1000 units CAPS Take 1,000 Units by mouth  daily.     No current facility-administered medications for this encounter.     Physical Findings:  height is 5\' 6"  (1.676 m) and weight is 155 lb 8 oz (70.5 kg). Her oral temperature is 98.5 F (36.9 C). Her blood pressure is 123/60 and her pulse is 84.   Lab Findings: Lab Results  Component Value Date   WBC 5.0 07/20/2016   HGB 13.3 07/20/2016   HCT 42.9 07/20/2016   MCV 95.3 07/20/2016   PLT 194 07/20/2016     Radiographic Findings: Ct Head W Wo Contrast  Addendum Date: 07/08/2016   ADDENDUM  REPORT: 07/08/2016 14:18 ADDENDUM: Study discussed by telephone with Dr. Lurline Del on 07/08/2016 at 1414 hours. Electronically Signed   By: Genevie Ann M.D.   On: 07/08/2016 14:18   Result Date: 07/08/2016 CLINICAL DATA:  55 year old female with breast cancer. Restaging. Headaches. Subsequent encounter. EXAM: CT HEAD WITHOUT AND WITH CONTRAST TECHNIQUE: Contiguous axial images were obtained from the base of the skull through the vertex without and with intravenous contrast CONTRAST:  11mL ISOVUE-300 IOPAMIDOL (ISOVUE-300) in conjunction with contrast enhanced imaging of the CT Abdomen and Pelvis reported separately. COMPARISON:  Whole-body bone scan 07/02/2016. FINDINGS: No osseous abnormality identified. Visible paranasal sinuses and mastoids are clear. Negative visualized orbit and scalp soft tissues. Cerebral volume is normal. No midline shift, ventriculomegaly, intracranial hemorrhage or evidence of cortically based acute infarction. Gray-white matter differentiation is within normal limits throughout the brain. There is a 13 mm enhancing mass in the posterior left cerebellum (series 2, image 9 and series 5, image 35). There might be a second 3-4 mm metastasis in the more inferior left cerebellum (series 5, image 35). There is minimal associated cerebellar edema by CT. No associated mass effect. No other enhancing brain mass identified. Major intracranial vascular structures are enhancing.  IMPRESSION: 1. Left cerebellar 13 mm enhancing lesion most compatible with metastatic disease in this setting. Possible additional small 4 mm left cerebellar metastasis. Minimal to mild associated edema and no intracranial mass effect. 2. Brain MRI without and with contrast might detect additional lesions and be valuable for treatment planning. 3. No other acute intracranial abnormality. Electronically Signed: By: Genevie Ann M.D. On: 07/08/2016 13:44   Mr Jeri Cos [id number] Contrast  Result Date: 07/23/2016 CLINICAL DATA:  55 year old female with recently discovered cerebellar mass. Breast cancer. Planning for possible stereotactic radio surgery. Subsequent encounter. EXAM: MRI HEAD WITHOUT AND WITH CONTRAST TECHNIQUE: Multiplanar, multiecho pulse sequences of the brain and surrounding structures were obtained without and with intravenous contrast. CONTRAST:  89mL MULTIHANCE GADOBENATE DIMEGLUMINE 529 MG/ML IV SOLN COMPARISON:  CT head without and with contrast 07/08/2016. FINDINGS: Study performed under anesthesia. 14 x 24 x 12 mm (AP by transverse by CC) mass which is majority enhancing along the posterior left cerebellar hemisphere appears inseparable from the overlying dura as seen on series 17, image 25, but there is no nodular dural thickening. The size of this metastasis is stable to minimally increased from the prior CT (the short axis of the lesion was reported at that time). Minimal associated left cerebellar edema. No significant mass effect. No other cerebellar metastasis. The questioned additional 4 mm left cerebellar lesion by CT was artifact. No other brain metastasis identified. Vascular pulsation artifact occasionally noted on some post-contrast images. No dural or leptomeningeal thickening elsewhere. Heterogeneous bone marrow signal in the calvarium, but no destructive skull lesion identified. Bone marrow signal at the skullbase and in the visualized cervical spine is normal. Negative visualized cervical  spinal cord. No restricted diffusion or evidence of acute infarction. Major intracranial vascular flow voids are preserved, dominant appearing distal right vertebral artery. No ventriculomegaly. No supratentorial mass or mass effect. No acute or chronic intracranial hemorrhage. Supratentorial gray and white matter signal is normal for age. Visible internal auditory structures appear normal. Visualized paranasal sinuses and mastoids are stable and well pneumatized. Orbit and scalp soft tissues appear normal. The patient is intubated. IMPRESSION: 1. Solitary 2.4 cm left cerebellar metastasis which appears to involve the overlying dura (series 17, image 25). Minimal edema with no mass effect.  2. The questioned additional 4 mm left cerebellar lesion by CT was artifact. No associated leptomeningeal, and no other dural or parenchymal metastatic disease identified. Electronically Signed   By: Genevie Ann M.D.   On: 07/23/2016 13:44   Nm Bone Scan Whole Body  Result Date: 07/02/2016 CLINICAL DATA:  Primary cancer of lower inner quadrant of left female breast. EXAM: NUCLEAR MEDICINE WHOLE BODY BONE SCAN TECHNIQUE: Whole body anterior and posterior images were obtained approximately 3 hours after intravenous injection of radiopharmaceutical. RADIOPHARMACEUTICALS:  27.0 mCi Technetium-54m MDP IV COMPARISON:  CT scan of March 02, 2016. FINDINGS: Grossly normal uptake is noted throughout the skeleton. No focal abnormalities are noted. IMPRESSION: No evidence of osseous metastases. Electronically Signed   By: Marijo Conception, M.D.   On: 07/02/2016 14:54   Ct Abdomen Pelvis W Contrast  Result Date: 07/08/2016 CLINICAL DATA:  55 year old female with history of breast cancer diagnosed in July 2016 status post mastectomy, chemotherapy and radiation therapy now complete. Restaging examination. EXAM: CT ABDOMEN AND PELVIS WITH CONTRAST TECHNIQUE: Multidetector CT imaging of the abdomen and pelvis was performed using the standard  protocol following bolus administration of intravenous contrast. CONTRAST:  168mL ISOVUE-300 IOPAMIDOL (ISOVUE-300) INJECTION 61% COMPARISON:  CT of the chest, abdomen and pelvis dated 03/02/2016 from [facility]. FINDINGS: Lower chest: Linear areas of scarring and atelectasis are noted in the lung bases bilaterally. Postoperative changes of left mastectomy and breast reconstruction are incidentally noted. Mild chronic pleural thickening dependently in the lower thorax bilaterally. Hepatobiliary: Multiple sub cm low-attenuation lesions are noted throughout the liver, similar in size, number and pattern of distribution compared to the prior study 03/02/2016, too small to characterize, but statistically likely to represent tiny cysts. No other larger more suspicious appearing hepatic lesions are noted. No intra or extrahepatic biliary ductal dilatation. Gallbladder is normal in appearance. Pancreas: No pancreatic mass. No pancreatic ductal dilatation. No pancreatic or peripancreatic fluid or inflammatory changes. Spleen: Unremarkable. Adrenals/Urinary Tract: Bilateral adrenal glands bilateral kidneys are normal in appearance. No hydroureteronephrosis. Urinary bladder is normal in appearance. Stomach/Bowel: Normal appearance of the stomach. No pathologic dilatation of small bowel or colon. Normal appendix. Vascular/Lymphatic: No significant atherosclerotic disease, aneurysm or dissection identified in the abdominal or pelvic vasculature. No lymphadenopathy noted in the abdomen or pelvis. Reproductive: Uterus and ovaries are normal in appearance. Other: No significant volume of ascites.  No pneumoperitoneum. Musculoskeletal: There are no aggressive appearing lytic or blastic lesions noted in the visualized portions of the skeleton. IMPRESSION: 1. No findings to suggest metastatic disease in the abdomen or pelvis. 2. No acute findings in the abdomen or pelvis. 3. Normal appendix. 4. Additional incidental  findings, as above. Electronically Signed   By: Vinnie Langton M.D.   On: 07/08/2016 11:57    Impression/Plan: Metastatic stage IV left breast cancer now with a solitary brain metastasis  At this point, the patient would potentially benefit from radiotherapy. The options include whole brain irradiation versus stereotactic radiosurgery. There are pros and cons associated with each of these potential treatment options. Whole brain radiotherapy would treat the known metastatic deposits and help provide some reduction of risk for future brain metastases. However, whole brain radiotherapy carries potential risks including hair loss, subacute somnolence, and neurocognitive changes including a possible reduction in short-term memory. Whole brain radiotherapy also may carry a lower likelihood of tumor control at the treatment sites because of the low-dose used. Stereotactic radiosurgery carries a higher likelihood for local tumor control  at the targeted sites with lower associated risk for neurocognitive changes such as memory loss. However, the use of stereotactic radiosurgery in this setting may leave the patient at increased risk for new brain metastases elsewhere in the brain as high as 50-60%. Accordingly, patients who receive stereotactic radiosurgery in this setting should undergo ongoing surveillance imaging with brain MRI more frequently in order to identify and treat new small brain metastases before they become symptomatic. Stereotactic radiosurgery does carry some different risks, including a risk of radionecrosis.  PLAN: Today, I reviewed the findings and workup thus far with the patient. Since this is a solitary metastasis, I would recommend SRS. For the patient's case, I believe that preoperative radiation treatment followed by resection would be very reasonable considering the patient's relatively young age and good systemic control currently. Her case will be presented on Wednesday in  multidisciplinary breast conference. The patient understands the treatment options and would like to proceed with stereotactic radiosurgery. The patient signed a consent form and this was placed in her medical chart. CT simulation will be scheduled soon.  ------------------------------------------------  Jodelle Gross, MD, PhD  This document serves as a record of services personally performed by Kyung Rudd, MD. It was created on his behalf by Darcus Austin, a trained medical scribe. The creation of this record is based on the scribe's personal observations and the provider's statements to them. This document has been checked and approved by the attending provider.

## 2016-07-27 NOTE — Progress Notes (Signed)
Re consult Metastatic stage IV T4 N3M1,  Breast cancer now Brain Mets Radiation therapy on Left Chest Wall and lymph nodes, from Dr. Maryan Rued near [place], [place]. completed radiation here    c/o headache  Frontal , behind her eyes, on back of neck and shoulder, 5/10 score, no c/o nausea now,  BP 123/60 (BP Location: Right Arm, Patient Position: Sitting, Cuff Size: Normal)   Pulse 84   Temp 98.5 F (36.9 C) (Oral)   Resp 18   Ht 5' 5.5" (1.664 m)   Wt 155 lb 8 oz (70.5 kg)   SpO2 100% Comment: room air  BMI 25.48 kg/m   Wt Readings from Last 3 Encounters:  07/27/16 155 lb 8 oz (70.5 kg)  07/27/16 155 lb 8 oz (70.5 kg)  07/23/16 154 lb (69.9 kg)

## 2016-07-28 ENCOUNTER — Ambulatory Visit (HOSPITAL_BASED_OUTPATIENT_CLINIC_OR_DEPARTMENT_OTHER): Payer: Medicaid Other

## 2016-07-28 ENCOUNTER — Other Ambulatory Visit (HOSPITAL_BASED_OUTPATIENT_CLINIC_OR_DEPARTMENT_OTHER): Payer: Medicaid Other

## 2016-07-28 VITALS — BP 118/71 | HR 79 | Temp 97.8°F | Resp 17

## 2016-07-28 DIAGNOSIS — C7951 Secondary malignant neoplasm of bone: Secondary | ICD-10-CM | POA: Diagnosis not present

## 2016-07-28 DIAGNOSIS — Z5112 Encounter for antineoplastic immunotherapy: Secondary | ICD-10-CM | POA: Diagnosis not present

## 2016-07-28 DIAGNOSIS — C50312 Malignant neoplasm of lower-inner quadrant of left female breast: Secondary | ICD-10-CM

## 2016-07-28 DIAGNOSIS — C773 Secondary and unspecified malignant neoplasm of axilla and upper limb lymph nodes: Secondary | ICD-10-CM

## 2016-07-28 LAB — CBC WITH DIFFERENTIAL/PLATELET
BASO%: 0.6 % (ref 0.0–2.0)
Basophils Absolute: 0 10*3/uL (ref 0.0–0.1)
EOS%: 2.2 % (ref 0.0–7.0)
Eosinophils Absolute: 0.1 10*3/uL (ref 0.0–0.5)
HCT: 37.8 % (ref 34.8–46.6)
HGB: 12.4 g/dL (ref 11.6–15.9)
LYMPH%: 14.6 % (ref 14.0–49.7)
MCH: 30 pg (ref 25.1–34.0)
MCHC: 32.7 g/dL (ref 31.5–36.0)
MCV: 91.7 fL (ref 79.5–101.0)
MONO#: 0.3 10*3/uL (ref 0.1–0.9)
MONO%: 6.1 % (ref 0.0–14.0)
NEUT#: 4.2 10*3/uL (ref 1.5–6.5)
NEUT%: 76.5 % (ref 38.4–76.8)
Platelets: 212 10*3/uL (ref 145–400)
RBC: 4.12 10*6/uL (ref 3.70–5.45)
RDW: 19 % — ABNORMAL HIGH (ref 11.2–14.5)
WBC: 5.4 10*3/uL (ref 3.9–10.3)
lymph#: 0.8 10*3/uL — ABNORMAL LOW (ref 0.9–3.3)

## 2016-07-28 LAB — COMPREHENSIVE METABOLIC PANEL
ALT: 13 U/L (ref 0–55)
AST: 18 U/L (ref 5–34)
Albumin: 4 g/dL (ref 3.5–5.0)
Alkaline Phosphatase: 50 U/L (ref 40–150)
Anion Gap: 7 mEq/L (ref 3–11)
BUN: 8.6 mg/dL (ref 7.0–26.0)
CO2: 26 mEq/L (ref 22–29)
Calcium: 9.7 mg/dL (ref 8.4–10.4)
Chloride: 106 mEq/L (ref 98–109)
Creatinine: 0.8 mg/dL (ref 0.6–1.1)
EGFR: 80 mL/min/{1.73_m2} — ABNORMAL LOW (ref >90)
Glucose: 91 mg/dl (ref 70–140)
Potassium: 4.1 mEq/L (ref 3.5–5.1)
Sodium: 139 mEq/L (ref 136–145)
Total Bilirubin: <0.3 mg/dL (ref 0.20–1.20)
Total Protein: 6.7 g/dL (ref 6.4–8.3)

## 2016-07-28 MED ORDER — SODIUM CHLORIDE 0.9% FLUSH
10.0000 mL | INTRAVENOUS | Status: DC | PRN
  Administered 2016-07-28: 10 mL
  Filled 2016-07-28: qty 10

## 2016-07-28 MED ORDER — ACETAMINOPHEN 325 MG PO TABS
ORAL_TABLET | ORAL | Status: AC
  Filled 2016-07-28: qty 2

## 2016-07-28 MED ORDER — DIPHENHYDRAMINE HCL 25 MG PO CAPS
ORAL_CAPSULE | ORAL | Status: AC
  Filled 2016-07-28: qty 1

## 2016-07-28 MED ORDER — SODIUM CHLORIDE 0.9 % IV SOLN
Freq: Once | INTRAVENOUS | Status: AC
  Administered 2016-07-28: 12:00:00 via INTRAVENOUS

## 2016-07-28 MED ORDER — SODIUM CHLORIDE 0.9 % IV SOLN
6.0000 mg/kg | Freq: Once | INTRAVENOUS | Status: AC
  Administered 2016-07-28: 420 mg via INTRAVENOUS
  Filled 2016-07-28: qty 20

## 2016-07-28 MED ORDER — DIPHENHYDRAMINE HCL 25 MG PO CAPS: 25.0000 mg | ORAL_CAPSULE | Freq: Once | ORAL | Status: DC

## 2016-07-28 MED ORDER — HEPARIN SOD (PORK) LOCK FLUSH 100 UNIT/ML IV SOLN
500.0000 [IU] | Freq: Once | INTRAVENOUS | Status: AC | PRN
  Administered 2016-07-28: 500 [IU]
  Filled 2016-07-28: qty 5

## 2016-07-28 MED ORDER — SODIUM CHLORIDE 0.9 % IV SOLN
420.0000 mg | Freq: Once | INTRAVENOUS | Status: AC
  Administered 2016-07-28: 420 mg via INTRAVENOUS
  Filled 2016-07-28: qty 14

## 2016-07-28 MED ORDER — ACETAMINOPHEN 325 MG PO TABS: 650.0000 mg | ORAL_TABLET | Freq: Once | ORAL | Status: DC

## 2016-07-28 NOTE — Patient Instructions (Signed)
[facility] Discharge Instructions for Patients Receiving Chemotherapy  Today you received the following chemotherapy agents:  Herceptin, Perjeta  To help prevent nausea and vomiting after your treatment, we encourage you to take your nausea medication as prescribed.   If you develop nausea and vomiting that is not controlled by your nausea medication, call the clinic.   BELOW ARE SYMPTOMS THAT SHOULD BE REPORTED IMMEDIATELY:  *FEVER GREATER THAN 100.5 F  *CHILLS WITH OR WITHOUT FEVER  NAUSEA AND VOMITING THAT IS NOT CONTROLLED WITH YOUR NAUSEA MEDICATION  *UNUSUAL SHORTNESS OF BREATH  *UNUSUAL BRUISING OR BLEEDING  TENDERNESS IN MOUTH AND THROAT WITH OR WITHOUT PRESENCE OF ULCERS  *URINARY PROBLEMS  *BOWEL PROBLEMS  UNUSUAL RASH Items with * indicate a potential emergency and should be followed up as soon as possible.  Feel free to call the clinic you have any questions or concerns. The clinic phone number is [phone].  Please show the CHEMO ALERT CARD at check-in to the Emergency Department and triage nurse.   

## 2016-07-29 ENCOUNTER — Other Ambulatory Visit: Payer: Self-pay | Admitting: Radiation Oncology

## 2016-07-29 LAB — CANCER ANTIGEN 27.29: CA 27.29: 10.5 U/mL (ref 0.0–38.6)

## 2016-07-29 MED ORDER — TRAMADOL HCL 50 MG PO TABS: 50.0000 mg | ORAL_TABLET | Freq: Four times a day (QID) | ORAL | 0 refills | Status: DC | PRN

## 2016-07-29 MED FILL — lamoTRIgine 100 MG TABS: 100 | 30 days supply | Qty: 90 | Fill #1

## 2016-07-29 NOTE — Addendum Note (Signed)
Encounter addended by: Jolaine Artist, MD on: 07/29/2016 11:59 PM<BR>    Actions taken: LOS modified, Sign clinical note

## 2016-07-29 NOTE — Telephone Encounter (Signed)
Patient called  And state she has been having a head che all day is requesting rx for pain, waiting call back,no paion med in her list will speak with MD and call patient back 3:37 PM

## 2016-07-30 ENCOUNTER — Telehealth: Payer: Self-pay | Admitting: *Deleted

## 2016-07-30 ENCOUNTER — Encounter: Payer: Self-pay | Admitting: *Deleted

## 2016-07-30 MED FILL — DEXAMETHASONE 4 MG TABLET: 4 | 30 days supply | Qty: 60 | Fill #0

## 2016-07-30 NOTE — Telephone Encounter (Signed)
Called patient and stated that  rx for tramadol has been written per Dr. Lisbeth Renshaw, can be picked up here at nursing, he feels if your head aches are worse then you should start decadron 4mg  bid, she stated her head aches weren't worse and she has some pills left of previous tramadol , and when she comes back to see up will pick up rx of tramadol then, will try that again, also can call in Decadron to her pharmacy and if she feels head aches getting worse will pick them up  9:45 AM

## 2016-07-30 NOTE — Telephone Encounter (Signed)
Called [facility] left voice message for rx: decadron 4mg , take 1 tablet(4mg ) oral twice a day for 30 days disoebse 60 tablet, no refills per Dr. Janeece Agee call me back at [phone] for any questions 9:48 AM

## 2016-07-30 NOTE — Progress Notes (Signed)
Does patient have an allergy to IV contrast dye?: No.   Has patient ever received premedication for IV contrast dye?: No.   Does patient take metformin?: No.  If patient does take metformin when was the last dose: not diabetic  Date of lab work: {07/28/16 BUN:8.6 CR: 0.8  IV site: Right power port a cath  There were no vitals taken for this visit.

## 2016-07-31 ENCOUNTER — Ambulatory Visit: Payer: Medicaid Other | Admitting: Radiation Oncology

## 2016-07-31 ENCOUNTER — Ambulatory Visit
Admission: RE | Admit: 2016-07-31 | Discharge: 2016-07-31 | Disposition: A | Discharge status: Home / Self Care | Payer: Medicaid Other | Source: Ambulatory Visit | Attending: Radiation Oncology | Admitting: Radiation Oncology

## 2016-07-31 NOTE — Progress Notes (Addendum)
Does patient have an allergy to IV contrast dye?: No.   Has patient ever received premedication for IV contrast dye?: No.   Does patient take metformin?: No.  If patient does take metformin when was the last dose: not diabetic  Date of lab work: {07/28/16 BUN:8.6 CR: 0.8  IV site: Right power port a cath. Accessed site on the first attempt. Excellent blood return obtained. Site flushed without difficulty. Patient tolerated this well. Secured access needle in placed. Contacted sim staff that patient is ready for simulation.  Patient took one Ativan tablet at 0845 and the second tablet at 0900.

## 2016-08-04 ENCOUNTER — Ambulatory Visit: Payer: Self-pay | Admitting: Radiation Oncology

## 2016-08-05 ENCOUNTER — Other Ambulatory Visit: Payer: Self-pay | Admitting: Neurosurgery

## 2016-08-05 ENCOUNTER — Ambulatory Visit
Admission: RE | Admit: 2016-08-05 | Discharge: 2016-08-05 | Disposition: A | Discharge status: Home / Self Care | Payer: Medicaid Other | Source: Ambulatory Visit | Attending: Radiation Oncology | Admitting: Radiation Oncology

## 2016-08-05 VITALS — BP 113/80 | HR 78 | Resp 16 | Wt 157.4 lb

## 2016-08-05 DIAGNOSIS — Z510 Encounter for antineoplastic radiation therapy: Secondary | ICD-10-CM | POA: Diagnosis not present

## 2016-08-05 DIAGNOSIS — C7931 Secondary malignant neoplasm of brain: Secondary | ICD-10-CM

## 2016-08-05 MED ORDER — SODIUM CHLORIDE 0.9% FLUSH
10.0000 mL | Freq: Once | INTRAVENOUS | Status: AC
  Administered 2016-08-05: 10 mL via INTRAVENOUS

## 2016-08-05 MED ORDER — HEPARIN SOD (PORK) LOCK FLUSH 100 UNIT/ML IV SOLN
500.0000 [IU] | Freq: Once | INTRAVENOUS | Status: AC
  Administered 2016-08-05: 500 [IU] via INTRAVENOUS

## 2016-08-05 NOTE — Progress Notes (Signed)
Flushed power port per protocol. Removed access needle. Applied bandaid. Patient tolerated this well. Needle intact upon removal.

## 2016-08-06 MED FILL — traZODone HCL 150 MG TABS: 150 | 30 days supply | Qty: 60 | Fill #1

## 2016-08-06 MED FILL — GABAPENTIN 300 MG CAPSULE: 300 | 30 days supply | Qty: 180 | Fill #1

## 2016-08-06 MED FILL — LITHIUM CARBONATE 300 MG CA: 300 | 30 days supply | Qty: 60 | Fill #1

## 2016-08-06 MED FILL — POTASSIUM CL ER 10 MEQ TAB: 10 | 30 days supply | Qty: 30 | Fill #1

## 2016-08-07 DIAGNOSIS — Z510 Encounter for antineoplastic radiation therapy: Secondary | ICD-10-CM | POA: Diagnosis not present

## 2016-08-12 ENCOUNTER — Ambulatory Visit: Admission: RE | Admit: 2016-08-12 | Payer: Medicaid Other | Source: Ambulatory Visit | Admitting: Radiation Oncology

## 2016-08-13 ENCOUNTER — Ambulatory Visit: Payer: Self-pay

## 2016-08-13 ENCOUNTER — Other Ambulatory Visit (HOSPITAL_COMMUNITY): Payer: Self-pay | Admitting: *Deleted

## 2016-08-13 ENCOUNTER — Encounter (HOSPITAL_COMMUNITY): Payer: Self-pay

## 2016-08-13 ENCOUNTER — Other Ambulatory Visit: Payer: Self-pay

## 2016-08-13 ENCOUNTER — Encounter (HOSPITAL_COMMUNITY)
Admission: RE | Admit: 2016-08-13 | Discharge: 2016-08-13 | Disposition: A | Discharge status: Home / Self Care | Payer: Medicaid Other | Source: Ambulatory Visit | Attending: Neurosurgery | Admitting: Neurosurgery

## 2016-08-13 HISTORY — DX: Anemia, unspecified: D64.9

## 2016-08-13 HISTORY — DX: Presence of other vascular implants and grafts: Z95.828

## 2016-08-13 LAB — ABO/RH: ABO/RH(D): O POS

## 2016-08-13 LAB — BASIC METABOLIC PANEL
Anion gap: 8 (ref 5–15)
BUN: 8 mg/dL (ref 6–20)
CO2: 25 mmol/L (ref 22–32)
Calcium: 9.2 mg/dL (ref 8.9–10.3)
Chloride: 107 mmol/L (ref 101–111)
Creatinine, Ser: 0.94 mg/dL (ref 0.44–1.00)
GFR calc Af Amer: >60 mL/min (ref >60)
GFR calc non Af Amer: >60 mL/min (ref >60)
Glucose, Bld: 96 mg/dL (ref 65–99)
Potassium: 4.3 mmol/L (ref 3.5–5.1)
Sodium: 140 mmol/L (ref 135–145)

## 2016-08-13 LAB — TYPE AND SCREEN
ABO/RH(D): O POS
Antibody Screen: NEGATIVE

## 2016-08-13 LAB — CBC
HCT: 40.8 % (ref 36.0–46.0)
Hemoglobin: 12.9 g/dL (ref 12.0–15.0)
MCH: 30.6 pg (ref 26.0–34.0)
MCHC: 31.6 g/dL (ref 30.0–36.0)
MCV: 96.9 fL (ref 78.0–100.0)
Platelets: 171 10*3/uL (ref 150–400)
RBC: 4.21 MIL/uL (ref 3.87–5.11)
RDW: 16 % — ABNORMAL HIGH (ref 11.5–15.5)
WBC: 5 10*3/uL (ref 4.0–10.5)

## 2016-08-13 MED ORDER — CEFAZOLIN SODIUM-DEXTROSE 2-4 GM/100ML-% IV SOLN
2.0000 g | INTRAVENOUS | Status: AC
  Administered 2016-08-14: 2 g via INTRAVENOUS
  Filled 2016-08-13: qty 100

## 2016-08-13 NOTE — Anesthesia Preprocedure Evaluation (Addendum)
Anesthesia Evaluation  Patient identified by MRN, date of birth, ID band Patient awake    Reviewed: Allergy & Precautions, NPO status , Patient's Chart, lab work & pertinent test results  Airway Mallampati: II  TM Distance: >3 FB Neck ROM: Full    Dental  (+) Dental Advisory Given   Pulmonary Current Smoker,    breath sounds clear to auscultation       Cardiovascular negative cardio ROS   Rhythm:Regular Rate:Normal     Neuro/Psych  Headaches, Anxiety Depression Bipolar Disorder Metastatic breast ca    GI/Hepatic Neg liver ROS, GERD  ,  Endo/Other  negative endocrine ROS  Renal/GU negative Renal ROS     Musculoskeletal  (+) Arthritis , Fibromyalgia -  Abdominal   Peds  Hematology negative hematology ROS (+)   Anesthesia Other Findings   Reproductive/Obstetrics                            Lab Results  Component Value Date   WBC 5.0 08/13/2016   HGB 12.9 08/13/2016   HCT 40.8 08/13/2016   MCV 96.9 08/13/2016   PLT 171 08/13/2016   Lab Results  Component Value Date   CREATININE 0.94 08/13/2016   BUN 8 08/13/2016   NA 140 08/13/2016   K 4.3 08/13/2016   CL 107 08/13/2016   CO2 25 08/13/2016    Anesthesia Physical Anesthesia Plan  ASA: II  Anesthesia Plan: General   Post-op Pain Management:    Induction: Intravenous  Airway Management Planned: Oral ETT  Additional Equipment: Arterial line and CVP  Intra-op Plan:   Post-operative Plan: Extubation in OR  Informed Consent: I have reviewed the patients History and Physical, chart, labs and discussed the procedure including the risks, benefits and alternatives for the proposed anesthesia with the patient or authorized representative who has indicated his/her understanding and acceptance.   Dental advisory given  Plan Discussed with: CRNA  Anesthesia Plan Comments:        Anesthesia Quick Evaluation

## 2016-08-13 NOTE — Pre-Procedure Instructions (Addendum)
[name]  08/13/2016     Your procedure is scheduled on Friday, August 14, 2016 at 7:30 AM.   Report to [facility] Entrance "A" Admitting Office at 5:30 AM.   Call this number if you have problems the morning of surgery: [phone]    Remember:  Do not eat food or drink liquids after midnight tonight.  Take these medicines the morning of surgery with A SIP OF WATER: Cetirizine (Zyrtec), Gabapentin (Neurontin), Lamotrigine (Lamictal), Lithium, Pantoprazole (Protonix), Lorazepam (Ativan) - if needed, Tramadol - if needed   Do not wear jewelry, make-up or nail polish.  Do not wear lotions, powders, or perfumes.  Do not shave 48 hours prior to surgery.    Do not bring valuables to the hospital.  [facility] is not responsible for any belongings or valuables.  Contacts, dentures or bridgework may not be worn into surgery.  Leave your suitcase in the car.  After surgery it may be brought to your room.  For patients admitted to the hospital, discharge time will be determined by your treatment team.  Special instructions:  [facility] - Preparing for Surgery  Before surgery, you can play an important role.  Because skin is not sterile, your skin needs to be as free of germs as possible.  You can reduce the number of germs on you skin by washing with CHG (chlorahexidine gluconate) soap before surgery.  CHG is an antiseptic cleaner which kills germs and bonds with the skin to continue killing germs even after washing.  Please DO NOT use if you have an allergy to CHG or antibacterial soaps.  If your skin becomes reddened/irritated stop using the CHG and inform your nurse when you arrive at Short Stay.  Do not shave (including legs and underarms) for at least 48 hours prior to the first CHG shower.  You may shave your face.  Please follow these instructions carefully:   1.  Shower with CHG Soap the night before surgery and the                    morning of Surgery.  2.  If  you choose to wash your hair, wash your hair first as usual with your       normal shampoo.  3.  After you shampoo, rinse your hair and body thoroughly to remove the shampoo.  4.  Use CHG as you would any other liquid soap.  You can apply chg directly       to the skin and wash gently with scrungie or a clean washcloth.  5.  Apply the CHG Soap to your body ONLY FROM THE NECK DOWN.        Do not use on open wounds or open sores.  Avoid contact with your eyes, ears, mouth and genitals (private parts).  Wash genitals (private parts) with your normal soap.  6.  Wash thoroughly, paying special attention to the area where your surgery        will be performed.  7.  Thoroughly rinse your body with warm water from the neck down.  8.  DO NOT shower/wash with your normal soap after using and rinsing off       the CHG Soap.  9.  Pat yourself dry with a clean towel.            10.  Wear clean pajamas.            11.  Place  clean sheets on your bed the night of your first shower and do not        sleep with pets.  Day of Surgery  Do not apply any lotions the morning of surgery.  Please wear clean clothes to the hospital.   Please read over the following fact sheets that you were given. Pain Booklet, Coughing and Deep Breathing and Surgical Site Infection Prevention

## 2016-08-13 NOTE — Progress Notes (Signed)
Pt did not have injection today, pt is scheduled for surgery on 08/14/2016

## 2016-08-13 NOTE — Progress Notes (Signed)
Pt denies cardiac history, chest pain or sob. 

## 2016-08-14 ENCOUNTER — Inpatient Hospital Stay (HOSPITAL_COMMUNITY): Payer: Medicaid Other

## 2016-08-14 ENCOUNTER — Inpatient Hospital Stay (HOSPITAL_COMMUNITY): Payer: Medicaid Other | Admitting: Anesthesiology

## 2016-08-14 ENCOUNTER — Encounter (HOSPITAL_COMMUNITY)
Admission: RE | Disposition: A | Discharge status: Home / Self Care | Payer: Self-pay | Source: Ambulatory Visit | Attending: Neurosurgery

## 2016-08-14 ENCOUNTER — Inpatient Hospital Stay (HOSPITAL_COMMUNITY)
Admission: RE | Admit: 2016-08-14 | Discharge: 2016-08-16 | DRG: 027 | Disposition: A | Discharge status: Home / Self Care | Payer: Medicaid Other | Source: Ambulatory Visit | Attending: Neurosurgery | Admitting: Neurosurgery

## 2016-08-14 ENCOUNTER — Encounter (HOSPITAL_COMMUNITY): Payer: Self-pay | Admitting: *Deleted

## 2016-08-14 DIAGNOSIS — Z79899 Other long term (current) drug therapy: Secondary | ICD-10-CM

## 2016-08-14 DIAGNOSIS — M797 Fibromyalgia: Secondary | ICD-10-CM | POA: Diagnosis present

## 2016-08-14 DIAGNOSIS — F4310 Post-traumatic stress disorder, unspecified: Secondary | ICD-10-CM | POA: Diagnosis present

## 2016-08-14 DIAGNOSIS — F329 Major depressive disorder, single episode, unspecified: Secondary | ICD-10-CM | POA: Diagnosis present

## 2016-08-14 DIAGNOSIS — F419 Anxiety disorder, unspecified: Secondary | ICD-10-CM | POA: Diagnosis present

## 2016-08-14 DIAGNOSIS — D496 Neoplasm of unspecified behavior of brain: Secondary | ICD-10-CM | POA: Diagnosis present

## 2016-08-14 DIAGNOSIS — K219 Gastro-esophageal reflux disease without esophagitis: Secondary | ICD-10-CM | POA: Diagnosis present

## 2016-08-14 DIAGNOSIS — F319 Bipolar disorder, unspecified: Secondary | ICD-10-CM | POA: Diagnosis present

## 2016-08-14 DIAGNOSIS — J939 Pneumothorax, unspecified: Secondary | ICD-10-CM

## 2016-08-14 DIAGNOSIS — C7931 Secondary malignant neoplasm of brain: Principal | ICD-10-CM | POA: Diagnosis present

## 2016-08-14 DIAGNOSIS — Z853 Personal history of malignant neoplasm of breast: Secondary | ICD-10-CM

## 2016-08-14 HISTORY — PX: CRANIOTOMY: SHX93

## 2016-08-14 HISTORY — PX: APPLICATION OF CRANIAL NAVIGATION: SHX6578

## 2016-08-14 LAB — HCG, SERUM, QUALITATIVE: Preg, Serum: NEGATIVE

## 2016-08-14 LAB — MRSA PCR SCREENING: MRSA by PCR: NEGATIVE

## 2016-08-14 SURGERY — CRANIOTOMY TUMOR EXCISION
Anesthesia: General

## 2016-08-14 MED ORDER — CEFAZOLIN IN D5W 1 GM/50ML IV SOLN
1.0000 g | Freq: Three times a day (TID) | INTRAVENOUS | Status: AC
  Administered 2016-08-14 (×2): 1 g via INTRAVENOUS
  Filled 2016-08-14 (×2): qty 50

## 2016-08-14 MED ORDER — ONDANSETRON HCL 4 MG/2ML IJ SOLN
INTRAMUSCULAR | Status: DC | PRN
  Administered 2016-08-14: 4 mg via INTRAVENOUS

## 2016-08-14 MED ORDER — LAMOTRIGINE 100 MG PO TABS
150.0000 mg | ORAL_TABLET | Freq: Two times a day (BID) | ORAL | Status: DC
  Administered 2016-08-14 – 2016-08-16 (×4): 150 mg via ORAL
  Filled 2016-08-14 (×5): qty 2

## 2016-08-14 MED ORDER — DEXAMETHASONE 6 MG PO TABS
6.0000 mg | ORAL_TABLET | Freq: Four times a day (QID) | ORAL | Status: AC
  Administered 2016-08-14 – 2016-08-15 (×4): 6 mg via ORAL
  Filled 2016-08-14 (×4): qty 1

## 2016-08-14 MED ORDER — PNEUMOCOCCAL VAC POLYVALENT 25 MCG/0.5ML IJ INJ
0.5000 mL | INJECTION | INTRAMUSCULAR | Status: AC
  Administered 2016-08-15: 0.5 mL via INTRAMUSCULAR
  Filled 2016-08-14: qty 0.5

## 2016-08-14 MED ORDER — LORAZEPAM 0.5 MG PO TABS
0.5000 mg | ORAL_TABLET | Freq: Three times a day (TID) | ORAL | Status: DC | PRN
  Administered 2016-08-14 – 2016-08-16 (×3): 0.5 mg via ORAL
  Filled 2016-08-14 (×3): qty 1

## 2016-08-14 MED ORDER — SUGAMMADEX SODIUM 200 MG/2ML IV SOLN
INTRAVENOUS | Status: AC
  Filled 2016-08-14: qty 2

## 2016-08-14 MED ORDER — POTASSIUM CHLORIDE ER 10 MEQ PO TBCR
10.0000 meq | EXTENDED_RELEASE_TABLET | Freq: Every day | ORAL | Status: DC
  Administered 2016-08-14 – 2016-08-16 (×3): 10 meq via ORAL
  Filled 2016-08-14 (×5): qty 1

## 2016-08-14 MED ORDER — FLUTICASONE PROPIONATE 50 MCG/ACT NA SUSP
1.0000 | Freq: Every day | NASAL | Status: DC
  Administered 2016-08-14 – 2016-08-16 (×2): 1 via NASAL
  Filled 2016-08-14: qty 16

## 2016-08-14 MED ORDER — HYDROCODONE-ACETAMINOPHEN 7.5-325 MG PO TABS
1.0000 | ORAL_TABLET | Freq: Four times a day (QID) | ORAL | Status: DC | PRN
  Administered 2016-08-14 (×2): 1 via ORAL
  Administered 2016-08-15: 2 via ORAL
  Administered 2016-08-15 – 2016-08-16 (×4): 1 via ORAL
  Filled 2016-08-14 (×7): qty 1
  Filled 2016-08-14: qty 2

## 2016-08-14 MED ORDER — ONDANSETRON HCL 4 MG/2ML IJ SOLN
4.0000 mg | INTRAMUSCULAR | Status: DC | PRN
  Administered 2016-08-15: 4 mg via INTRAVENOUS
  Filled 2016-08-14: qty 2

## 2016-08-14 MED ORDER — VITAMIN D 1000 UNITS PO TABS
1000.0000 [IU] | ORAL_TABLET | Freq: Every day | ORAL | Status: DC
  Administered 2016-08-14 – 2016-08-16 (×3): 1000 [IU] via ORAL
  Filled 2016-08-14 (×3): qty 1

## 2016-08-14 MED ORDER — EPHEDRINE 5 MG/ML INJ
INTRAVENOUS | Status: AC
  Filled 2016-08-14: qty 10

## 2016-08-14 MED ORDER — LABETALOL HCL 5 MG/ML IV SOLN: 10.0000 mg | INTRAVENOUS | Status: DC | PRN

## 2016-08-14 MED ORDER — PANTOPRAZOLE SODIUM 40 MG PO TBEC
40.0000 mg | DELAYED_RELEASE_TABLET | Freq: Every day | ORAL | Status: DC
  Administered 2016-08-15 – 2016-08-16 (×2): 40 mg via ORAL
  Filled 2016-08-14 (×3): qty 1

## 2016-08-14 MED ORDER — HYDROMORPHONE HCL 1 MG/ML IJ SOLN
INTRAMUSCULAR | Status: AC
  Filled 2016-08-14: qty 1

## 2016-08-14 MED ORDER — HYDROCODONE-ACETAMINOPHEN 7.5-325 MG PO TABS: 1.0000 | ORAL_TABLET | Freq: Once | ORAL | Status: DC | PRN

## 2016-08-14 MED ORDER — BACITRACIN ZINC 500 UNIT/GM EX OINT
TOPICAL_OINTMENT | CUTANEOUS | Status: DC | PRN
  Administered 2016-08-14: 1 via TOPICAL

## 2016-08-14 MED ORDER — TRIAMCINOLONE ACETONIDE 0.5 % EX CREA: 1.0000 "application " | TOPICAL_CREAM | Freq: Every day | CUTANEOUS | Status: DC | PRN

## 2016-08-14 MED ORDER — TROLAMINE SALICYLATE 10 % EX CREA: 1.0000 "application " | TOPICAL_CREAM | CUTANEOUS | Status: DC | PRN

## 2016-08-14 MED ORDER — FAMOTIDINE IN NACL 20-0.9 MG/50ML-% IV SOLN
20.0000 mg | Freq: Two times a day (BID) | INTRAVENOUS | Status: DC
  Administered 2016-08-14 (×2): 20 mg via INTRAVENOUS
  Filled 2016-08-14 (×2): qty 50

## 2016-08-14 MED ORDER — LORATADINE 10 MG PO TABS
10.0000 mg | ORAL_TABLET | Freq: Every day | ORAL | Status: DC
  Administered 2016-08-15 – 2016-08-16 (×2): 10 mg via ORAL
  Filled 2016-08-14 (×3): qty 1

## 2016-08-14 MED ORDER — SUGAMMADEX SODIUM 200 MG/2ML IV SOLN
INTRAVENOUS | Status: DC | PRN
  Administered 2016-08-14: 200 mg via INTRAVENOUS

## 2016-08-14 MED ORDER — ROCURONIUM BROMIDE 10 MG/ML (PF) SYRINGE
PREFILLED_SYRINGE | INTRAVENOUS | Status: AC
  Filled 2016-08-14: qty 10

## 2016-08-14 MED ORDER — DOCUSATE SODIUM 100 MG PO CAPS
100.0000 mg | ORAL_CAPSULE | Freq: Two times a day (BID) | ORAL | Status: DC
  Administered 2016-08-14: 100 mg via ORAL
  Filled 2016-08-14 (×2): qty 1

## 2016-08-14 MED ORDER — ROCURONIUM BROMIDE 100 MG/10ML IV SOLN
INTRAVENOUS | Status: DC | PRN
  Administered 2016-08-14: 50 mg via INTRAVENOUS
  Administered 2016-08-14: 30 mg via INTRAVENOUS

## 2016-08-14 MED ORDER — PROPOFOL 10 MG/ML IV BOLUS
INTRAVENOUS | Status: DC | PRN
  Administered 2016-08-14: 200 mg via INTRAVENOUS

## 2016-08-14 MED ORDER — BUPIVACAINE HCL (PF) 0.5 % IJ SOLN
INTRAMUSCULAR | Status: DC | PRN
  Administered 2016-08-14: 20 mL

## 2016-08-14 MED ORDER — ONDANSETRON HCL 4 MG PO TABS: 8.0000 mg | ORAL_TABLET | Freq: Three times a day (TID) | ORAL | Status: DC | PRN

## 2016-08-14 MED ORDER — PROPOFOL 500 MG/50ML IV EMUL
INTRAVENOUS | Status: DC | PRN
  Administered 2016-08-14: 25 ug/kg/min via INTRAVENOUS
  Administered 2016-08-14: 50 ug/kg/min via INTRAVENOUS

## 2016-08-14 MED ORDER — DEXAMETHASONE SODIUM PHOSPHATE 10 MG/ML IJ SOLN
INTRAMUSCULAR | Status: DC | PRN
  Administered 2016-08-14: 10 mg via INTRAVENOUS

## 2016-08-14 MED ORDER — HYDROMORPHONE HCL 1 MG/ML IJ SOLN
0.2500 mg | INTRAMUSCULAR | Status: DC | PRN
  Administered 2016-08-14 (×2): 0.5 mg via INTRAVENOUS

## 2016-08-14 MED ORDER — SODIUM CHLORIDE 0.9 % IV SOLN
INTRAVENOUS | Status: DC | PRN
  Administered 2016-08-14: 07:00:00 via INTRAVENOUS

## 2016-08-14 MED ORDER — DEXAMETHASONE 4 MG PO TABS: 4.0000 mg | ORAL_TABLET | Freq: Four times a day (QID) | ORAL | Status: DC

## 2016-08-14 MED ORDER — DEXAMETHASONE 4 MG PO TABS: 4.0000 mg | ORAL_TABLET | Freq: Three times a day (TID) | ORAL | Status: DC

## 2016-08-14 MED ORDER — VARENICLINE TARTRATE 0.5 MG PO TABS
0.5000 mg | ORAL_TABLET | Freq: Every day | ORAL | Status: DC
  Administered 2016-08-14 – 2016-08-16 (×3): 0.5 mg via ORAL
  Filled 2016-08-14 (×3): qty 1

## 2016-08-14 MED ORDER — POLYETHYLENE GLYCOL 3350 17 GM/SCOOP PO POWD
17.0000 g | Freq: Two times a day (BID) | ORAL | Status: DC | PRN
  Filled 2016-08-14: qty 255

## 2016-08-14 MED ORDER — LIDOCAINE 2% (20 MG/ML) 5 ML SYRINGE
INTRAMUSCULAR | Status: AC
  Filled 2016-08-14: qty 5

## 2016-08-14 MED ORDER — FENTANYL CITRATE (PF) 100 MCG/2ML IJ SOLN
INTRAMUSCULAR | Status: AC
  Filled 2016-08-14: qty 2

## 2016-08-14 MED ORDER — ACETAMINOPHEN 650 MG RE SUPP: 650.0000 mg | RECTAL | Status: DC | PRN

## 2016-08-14 MED ORDER — ACETAMINOPHEN 325 MG PO TABS: 650.0000 mg | ORAL_TABLET | ORAL | Status: DC | PRN

## 2016-08-14 MED ORDER — SENNOSIDES-DOCUSATE SODIUM 8.6-50 MG PO TABS: 1.0000 | ORAL_TABLET | Freq: Every evening | ORAL | Status: DC | PRN

## 2016-08-14 MED ORDER — FENTANYL CITRATE (PF) 100 MCG/2ML IJ SOLN
INTRAMUSCULAR | Status: DC | PRN
  Administered 2016-08-14 (×3): 100 ug via INTRAVENOUS

## 2016-08-14 MED ORDER — MICROFIBRILLAR COLL HEMOSTAT EX PADS
MEDICATED_PAD | CUTANEOUS | Status: DC | PRN
  Administered 2016-08-14: 1 via TOPICAL

## 2016-08-14 MED ORDER — HYDROCODONE-ACETAMINOPHEN 5-325 MG PO TABS: 1.0000 | ORAL_TABLET | ORAL | Status: DC | PRN

## 2016-08-14 MED ORDER — PROMETHAZINE HCL 25 MG PO TABS: 12.5000 mg | ORAL_TABLET | ORAL | Status: DC | PRN

## 2016-08-14 MED ORDER — PROMETHAZINE HCL 25 MG/ML IJ SOLN: 6.2500 mg | INTRAMUSCULAR | Status: DC | PRN

## 2016-08-14 MED ORDER — FLEET ENEMA 7-19 GM/118ML RE ENEM: 1.0000 | ENEMA | Freq: Once | RECTAL | Status: DC | PRN

## 2016-08-14 MED ORDER — BLISTEX MEDICATED EX OINT
TOPICAL_OINTMENT | CUTANEOUS | Status: DC | PRN
  Administered 2016-08-14: 15:00:00 via TOPICAL
  Filled 2016-08-14: qty 6.3

## 2016-08-14 MED ORDER — DEXAMETHASONE 4 MG PO TABS
4.0000 mg | ORAL_TABLET | Freq: Four times a day (QID) | ORAL | Status: AC
  Administered 2016-08-15 – 2016-08-16 (×4): 4 mg via ORAL
  Filled 2016-08-14 (×4): qty 1

## 2016-08-14 MED ORDER — ACETAMINOPHEN 10 MG/ML IV SOLN
INTRAVENOUS | Status: AC
Start: 2016-08-14 — End: 2016-08-14
  Filled 2016-08-14: qty 100

## 2016-08-14 MED ORDER — TRAMADOL HCL 50 MG PO TABS: 50.0000 mg | ORAL_TABLET | Freq: Four times a day (QID) | ORAL | Status: DC | PRN

## 2016-08-14 MED ORDER — CHLORHEXIDINE GLUCONATE CLOTH 2 % EX PADS: 6.0000 | MEDICATED_PAD | Freq: Once | CUTANEOUS | Status: DC

## 2016-08-14 MED ORDER — BISACODYL 10 MG RE SUPP: 10.0000 mg | Freq: Every day | RECTAL | Status: DC | PRN

## 2016-08-14 MED ORDER — LIDOCAINE-EPINEPHRINE 1 %-1:100000 IJ SOLN
INTRAMUSCULAR | Status: DC | PRN
  Administered 2016-08-14: 20 mL

## 2016-08-14 MED ORDER — MIDAZOLAM HCL 2 MG/2ML IJ SOLN
INTRAMUSCULAR | Status: AC
  Filled 2016-08-14: qty 2

## 2016-08-14 MED ORDER — ACETAMINOPHEN 10 MG/ML IV SOLN
INTRAVENOUS | Status: DC | PRN
  Administered 2016-08-14: 1000 mg via INTRAVENOUS

## 2016-08-14 MED ORDER — ONDANSETRON HCL 4 MG/2ML IJ SOLN
INTRAMUSCULAR | Status: AC
  Filled 2016-08-14: qty 2

## 2016-08-14 MED ORDER — PHENYLEPHRINE HCL 10 MG/ML IJ SOLN
INTRAMUSCULAR | Status: DC | PRN
  Administered 2016-08-14: 80 ug via INTRAVENOUS
  Administered 2016-08-14: 40 ug via INTRAVENOUS

## 2016-08-14 MED ORDER — LIDOCAINE HCL (CARDIAC) 20 MG/ML IV SOLN
INTRAVENOUS | Status: DC | PRN
  Administered 2016-08-14: 80 mg via INTRAVENOUS

## 2016-08-14 MED ORDER — PROCHLORPERAZINE MALEATE 10 MG PO TABS: 10.0000 mg | ORAL_TABLET | Freq: Every day | ORAL | Status: DC | PRN

## 2016-08-14 MED ORDER — DEXAMETHASONE SODIUM PHOSPHATE 10 MG/ML IJ SOLN
INTRAMUSCULAR | Status: AC
  Filled 2016-08-14: qty 1

## 2016-08-14 MED ORDER — POLYETHYLENE GLYCOL 3350 17 G PO PACK: 17.0000 g | PACK | Freq: Two times a day (BID) | ORAL | Status: DC | PRN

## 2016-08-14 MED ORDER — POTASSIUM CHLORIDE IN NACL 20-0.9 MEQ/L-% IV SOLN
INTRAVENOUS | Status: DC
  Administered 2016-08-14: 13:00:00 via INTRAVENOUS
  Filled 2016-08-14 (×2): qty 1000

## 2016-08-14 MED ORDER — EPHEDRINE SULFATE 50 MG/ML IJ SOLN
INTRAMUSCULAR | Status: DC | PRN
  Administered 2016-08-14 (×3): 5 mg via INTRAVENOUS

## 2016-08-14 MED ORDER — TRAZODONE HCL 100 MG PO TABS
300.0000 mg | ORAL_TABLET | Freq: Every day | ORAL | Status: DC
  Administered 2016-08-14 – 2016-08-15 (×2): 300 mg via ORAL
  Filled 2016-08-14: qty 2
  Filled 2016-08-14: qty 3

## 2016-08-14 MED ORDER — THROMBIN 20000 UNITS EX SOLR
CUTANEOUS | Status: DC | PRN
  Administered 2016-08-14: 08:00:00 via TOPICAL

## 2016-08-14 MED ORDER — ONDANSETRON HCL 4 MG PO TABS
4.0000 mg | ORAL_TABLET | ORAL | Status: DC | PRN
  Administered 2016-08-14: 4 mg via ORAL
  Filled 2016-08-14: qty 1

## 2016-08-14 MED ORDER — SODIUM CHLORIDE 0.9 % IR SOLN
Status: DC | PRN
  Administered 2016-08-14 (×3): 1000 mL

## 2016-08-14 MED ORDER — INFLUENZA VAC SPLIT QUAD 0.5 ML IM SUSY
0.5000 mL | PREFILLED_SYRINGE | INTRAMUSCULAR | Status: AC
  Administered 2016-08-15: 0.5 mL via INTRAMUSCULAR
  Filled 2016-08-14: qty 0.5

## 2016-08-14 MED ORDER — TIZANIDINE HCL 4 MG PO TABS
4.0000 mg | ORAL_TABLET | Freq: Every day | ORAL | Status: DC | PRN
  Filled 2016-08-14: qty 1

## 2016-08-14 MED ORDER — LITHIUM CARBONATE 300 MG PO CAPS
300.0000 mg | ORAL_CAPSULE | Freq: Two times a day (BID) | ORAL | Status: DC
  Administered 2016-08-14 – 2016-08-16 (×4): 300 mg via ORAL
  Filled 2016-08-14 (×4): qty 1

## 2016-08-14 MED ORDER — DIPHENOXYLATE-ATROPINE 2.5-0.025 MG PO TABS: 2.0000 | ORAL_TABLET | Freq: Four times a day (QID) | ORAL | Status: DC | PRN

## 2016-08-14 MED ORDER — PHENYLEPHRINE HCL 10 MG/ML IJ SOLN
INTRAVENOUS | Status: DC | PRN
  Administered 2016-08-14: 40 ug/min via INTRAVENOUS

## 2016-08-14 MED ORDER — MORPHINE SULFATE (PF) 2 MG/ML IV SOLN
1.0000 mg | INTRAVENOUS | Status: DC | PRN
  Administered 2016-08-14 – 2016-08-16 (×6): 2 mg via INTRAVENOUS
  Filled 2016-08-14 (×6): qty 1

## 2016-08-14 MED ORDER — PHENYLEPHRINE 40 MCG/ML (10ML) SYRINGE FOR IV PUSH (FOR BLOOD PRESSURE SUPPORT)
PREFILLED_SYRINGE | INTRAVENOUS | Status: AC
  Filled 2016-08-14: qty 10

## 2016-08-14 MED ORDER — GABAPENTIN 300 MG PO CAPS
600.0000 mg | ORAL_CAPSULE | Freq: Three times a day (TID) | ORAL | Status: DC
  Administered 2016-08-14 – 2016-08-16 (×6): 600 mg via ORAL
  Filled 2016-08-14 (×6): qty 2

## 2016-08-14 MED ORDER — SODIUM CHLORIDE 0.9 % IV SOLN
0.0125 ug/kg/min | INTRAVENOUS | Status: AC
  Administered 2016-08-14: .2 ug/kg/min via INTRAVENOUS
  Filled 2016-08-14: qty 2000

## 2016-08-14 MED ORDER — THROMBIN 5000 UNITS EX SOLR
OROMUCOSAL | Status: DC | PRN
  Administered 2016-08-14: 10:00:00 via TOPICAL

## 2016-08-14 MED ORDER — FENTANYL CITRATE (PF) 100 MCG/2ML IJ SOLN
INTRAMUSCULAR | Status: AC
  Filled 2016-08-14: qty 4

## 2016-08-14 SURGICAL SUPPLY — 83 items
APL SKNCLS STERI-STRIP NONHPOA (GAUZE/BANDAGES/DRESSINGS) ×1
BANDAGE GAUZE 4  KLING STR (GAUZE/BANDAGES/DRESSINGS) IMPLANT
BATTERY IQ STERILE (MISCELLANEOUS) ×2 IMPLANT
BENZOIN TINCTURE PRP APPL 2/3 (GAUZE/BANDAGES/DRESSINGS) ×2 IMPLANT
BIT DRILL WIRE PASS 1.3MM (BIT) IMPLANT
BLADE CLIPPER SURG (BLADE) ×2 IMPLANT
BTRY SRG DRVR 1.5 IQ (MISCELLANEOUS) ×1
BUR ACORN 6.0 PRECISION (BURR) ×2 IMPLANT
BUR ROUND FLUTED 5 RND (BURR) ×2 IMPLANT
BUR SPIRAL ROUTER 2.3 (BUR) ×2 IMPLANT
CANISTER SUCT 3000ML PPV (MISCELLANEOUS) ×2 IMPLANT
CLIP TI MEDIUM 6 (CLIP) IMPLANT
CONT SPEC 4OZ CLIKSEAL STRL BL (MISCELLANEOUS) ×2 IMPLANT
COVER MAYO STAND STRL (DRAPES) IMPLANT
DECANTER SPIKE VIAL GLASS SM (MISCELLANEOUS) ×2 IMPLANT
DRAPE MICROSCOPE LEICA (MISCELLANEOUS) IMPLANT
DRAPE NEUROLOGICAL W/INCISE (DRAPES) ×2 IMPLANT
DRAPE STERI IOBAN 125X83 (DRAPES) IMPLANT
DRAPE WARM FLUID 44X44 (DRAPE) ×2 IMPLANT
DRILL WIRE PASS 1.3MM (BIT)
DRSG OPSITE POSTOP 3X4 (GAUZE/BANDAGES/DRESSINGS) ×2 IMPLANT
DRSG TELFA 3X8 NADH (GAUZE/BANDAGES/DRESSINGS) ×2 IMPLANT
DURAMATRIX ONLAY 2X2 (Neuro Prosthesis/Implant) ×2 IMPLANT
DURAPREP 6ML APPLICATOR 50/CS (WOUND CARE) ×2 IMPLANT
ELECT REM PT RETURN 9FT ADLT (ELECTROSURGICAL) ×2
ELECTRODE REM PT RTRN 9FT ADLT (ELECTROSURGICAL) ×1 IMPLANT
EVACUATOR 1/8 PVC DRAIN (DRAIN) IMPLANT
EVACUATOR SILICONE 100CC (DRAIN) IMPLANT
FORCEPS BIPOLAR SPETZLER 8 1.0 (NEUROSURGERY SUPPLIES) ×2 IMPLANT
GAUZE SPONGE 4X4 12PLY STRL (GAUZE/BANDAGES/DRESSINGS) ×2 IMPLANT
GAUZE SPONGE 4X4 16PLY XRAY LF (GAUZE/BANDAGES/DRESSINGS) IMPLANT
GLOVE BIO SURGEON STRL SZ8 (GLOVE) ×4 IMPLANT
GLOVE BIOGEL PI IND STRL 8 (GLOVE) ×2 IMPLANT
GLOVE BIOGEL PI IND STRL 8.5 (GLOVE) ×2 IMPLANT
GLOVE BIOGEL PI INDICATOR 8 (GLOVE) ×2
GLOVE BIOGEL PI INDICATOR 8.5 (GLOVE) ×2
GLOVE ECLIPSE 8.0 STRL XLNG CF (GLOVE) ×2 IMPLANT
GLOVE EXAM NITRILE LRG STRL (GLOVE) IMPLANT
GLOVE EXAM NITRILE XL STR (GLOVE) IMPLANT
GLOVE EXAM NITRILE XS STR PU (GLOVE) IMPLANT
GLOVE INDICATOR 6.5 STRL GRN (GLOVE) ×2 IMPLANT
GLOVE INDICATOR 7.5 STRL GRN (GLOVE) ×2 IMPLANT
GLOVE SS BIOGEL STRL SZ 7 (GLOVE) ×1 IMPLANT
GLOVE SUPERSENSE BIOGEL SZ 7 (GLOVE) ×1
GOWN STRL REUS W/ TWL LRG LVL3 (GOWN DISPOSABLE) IMPLANT
GOWN STRL REUS W/ TWL XL LVL3 (GOWN DISPOSABLE) IMPLANT
GOWN STRL REUS W/TWL 2XL LVL3 (GOWN DISPOSABLE) IMPLANT
GOWN STRL REUS W/TWL LRG LVL3 (GOWN DISPOSABLE)
GOWN STRL REUS W/TWL XL LVL3 (GOWN DISPOSABLE)
HEMOSTAT POWDER SURGIFOAM 1G (HEMOSTASIS) ×2 IMPLANT
HEMOSTAT SURGICEL 2X14 (HEMOSTASIS) ×2 IMPLANT
KIT BASIN OR (CUSTOM PROCEDURE TRAY) ×2 IMPLANT
KIT ROOM TURNOVER OR (KITS) ×2 IMPLANT
MARKER SKIN DUAL TIP RULER LAB (MISCELLANEOUS) ×2 IMPLANT
MARKER SPHERE PSV REFLC 13MM (MARKER) ×8 IMPLANT
NEEDLE HYPO 25X1 1.5 SAFETY (NEEDLE) ×2 IMPLANT
NS IRRIG 1000ML POUR BTL (IV SOLUTION) ×4 IMPLANT
PACK CRANIOTOMY (CUSTOM PROCEDURE TRAY) ×2 IMPLANT
PAD ARMBOARD 7.5X6 YLW CONV (MISCELLANEOUS) ×2 IMPLANT
PATTIES SURGICAL .25X.25 (GAUZE/BANDAGES/DRESSINGS) IMPLANT
PATTIES SURGICAL .5 X.5 (GAUZE/BANDAGES/DRESSINGS) IMPLANT
PATTIES SURGICAL .5 X3 (DISPOSABLE) IMPLANT
PATTIES SURGICAL 1/4 X 3 (GAUZE/BANDAGES/DRESSINGS) IMPLANT
PATTIES SURGICAL 1X1 (DISPOSABLE) IMPLANT
PIN MAYFIELD SKULL DISP (PIN) ×2 IMPLANT
PLATE 1.5/0.6 85X53M SM PANEL (Plate) ×2 IMPLANT
RUBBERBAND STERILE (MISCELLANEOUS) IMPLANT
SCREW SELF DRILL HT 1.5/4MM (Screw) ×8 IMPLANT
SPECIMEN JAR SMALL (MISCELLANEOUS) IMPLANT
SPONGE NEURO XRAY DETECT 1X3 (DISPOSABLE) IMPLANT
SPONGE SURGIFOAM ABS GEL 100 (HEMOSTASIS) ×2 IMPLANT
STAPLER SKIN PROX WIDE 3.9 (STAPLE) ×2 IMPLANT
SUT ETHILON 3 0 FSL (SUTURE) ×2 IMPLANT
SUT NURALON 4 0 TR CR/8 (SUTURE) ×6 IMPLANT
SUT SILK 2 0 FS (SUTURE) IMPLANT
SUT VIC AB 2-0 CP2 18 (SUTURE) ×4 IMPLANT
SYR CONTROL 10ML LL (SYRINGE) ×2 IMPLANT
TOWEL OR 17X24 6PK STRL BLUE (TOWEL DISPOSABLE) ×2 IMPLANT
TOWEL OR 17X26 10 PK STRL BLUE (TOWEL DISPOSABLE) ×2 IMPLANT
TRAY FOLEY W/METER SILVER 16FR (SET/KITS/TRAYS/PACK) ×2 IMPLANT
TUBE CONNECTING 12X1/4 (SUCTIONS) ×2 IMPLANT
UNDERPAD 30X30 (UNDERPADS AND DIAPERS) ×2 IMPLANT
WATER STERILE IRR 1000ML POUR (IV SOLUTION) ×2 IMPLANT

## 2016-08-14 NOTE — Brief Op Note (Signed)
08/14/2016  10:52 AM  PATIENT:  [name]  55 y.o. 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   PROCEDURE:  Procedure(s): CRANIOTOMY TUMOR EXCISION WITH BRAINLAB (N/A) APPLICATION OF CRANIAL NAVIGATION (N/A)  SURGEON:  Surgeon(s) and Role:    * Erline Levine, MD - Primary  PHYSICIAN ASSISTANT: Ditty, MD  ASSISTANTS: Poteat, RN   ANESTHESIA:   general  EBL:  Total I/O In: 1800 [I.V.:1800] Out: [id number] [Urine:1000; Blood:50]  BLOOD ADMINISTERED:none  DRAINS: none   LOCAL MEDICATIONS USED:  LIDOCAINE   SPECIMEN:  Excision  DISPOSITION OF SPECIMEN:  PATHOLOGY  COUNTS:  YES  TOURNIQUET:  * No tourniquets in log *  DICTATION: DICTATION: Patient is 55 year old woman with a  left cerebellar metastatic brain tumor.  She has a history of breast cancer.  Patient has developed nausea and vomiting and early hydrocephalus.  It was elected to take patient to surgery for sub-occipital craniectomy for metastasis using BrainLab navigation.  Procedure:  Following smooth intubation, patient was placed in prone position on chest rolls with 3 pin head fixation. Occipital region was shaved and prepped and draped in usual sterile fashion with Duraprep after registering the patient with the [facility] navigation system and marking the tumor.  Area of planned incision was infiltrated with lidocaine. A linear incision was made in the suboccipital region to expose calvarium and extended to the left. High speed drill was used to perform left suboccipital craniectomy to expose the dura.  Dura was opened over the left cerebellar hemisphere.  The tumor came to the surface and did not appear to be attached to the Dura.  The tumor was separated from the brain and removed en bloc.   The tumor appeared gritty and was whitish and firm.    The entire tumor was removed.   Hemostasis  was assured with irrigation and Surgifoam.  Hemostasis was assured also with Surgifoam.  The brain was considerably more relaxed after tumor resection.  The evacuation cavity was lined with Surgifoam. The dura was patched with Dura-matrix and a Titanium plate was fashioned and affixed to the skull with screws.  the fascia was closed with 0 vicryl sutures, subcutaneous tissues were reapproximated with 2-0 vicryl sutures and the skin was re approximated with  A running 3-0 Nylon stitch.  A sterile occlusive dressing was placed.  Patient was returned to a supine position and taken out of pins and extubated in the operating room and taken to Recovery having tolerated her surgery well.  Counts were correct at the end of the case.   PLAN OF CARE: Admit to inpatient   PATIENT DISPOSITION:  PACU - hemodynamically stable.   Delay start of Pharmacological VTE agent (>24hrs) due to surgical blood loss or risk of bleeding: yes

## 2016-08-14 NOTE — Anesthesia Postprocedure Evaluation (Signed)
Anesthesia Post Note  Patient: Parnika Segoviano  Procedure(s) Performed: Procedure(s) (LRB): CRANIOTOMY TUMOR EXCISION WITH BRAINLAB (N/A) APPLICATION OF CRANIAL NAVIGATION (N/A)  Patient location during evaluation: PACU Anesthesia Type: General Level of consciousness: awake and alert Pain management: pain level controlled Vital Signs Assessment: post-procedure vital signs reviewed and stable Respiratory status: spontaneous breathing, nonlabored ventilation, respiratory function stable and patient connected to nasal cannula oxygen Cardiovascular status: blood pressure returned to baseline and stable Postop Assessment: no signs of nausea or vomiting Anesthetic complications: no    Last Vitals:  Vitals:   08/14/16 1130 08/14/16 1200  BP:    Pulse: 90   Resp: 14   Temp: 36.3 C 36.5 C    Last Pain:  Vitals:   08/14/16 1200  TempSrc: Oral  PainSc:                  Tiajuana Amass

## 2016-08-14 NOTE — Anesthesia Procedure Notes (Signed)
Procedure Name: Intubation Date/Time: 08/14/2016 7:45 AM Performed by: Jacquiline Doe A Pre-anesthesia Checklist: Patient identified, Emergency Drugs available, Suction available, Patient being monitored and Timeout performed Patient Re-evaluated:Patient Re-evaluated prior to inductionOxygen Delivery Method: Circle system utilized Preoxygenation: Pre-oxygenation with 100% oxygen Intubation Type: IV induction and Cricoid Pressure applied Ventilation: Mask ventilation without difficulty Laryngoscope Size: Mac and 3 Grade View: Grade II Tube type: Oral Tube size: 7.0 mm Number of attempts: 1 Airway Equipment and Method: Stylet Placement Confirmation: ETT inserted through vocal cords under direct vision,  positive ETCO2,  CO2 detector and breath sounds checked- equal and bilateral Secured at: 21 cm Tube secured with: Tape Dental Injury: Teeth and Oropharynx as per pre-operative assessment  Comments: Inserted by daniel huff srna

## 2016-08-14 NOTE — Op Note (Signed)
08/14/2016  10:52 AM  PATIENT:  [name]  55 y.o. 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   PROCEDURE:  Procedure(s): CRANIOTOMY TUMOR EXCISION WITH BRAINLAB (N/A) APPLICATION OF CRANIAL NAVIGATION (N/A)  SURGEON:  Surgeon(s) and Role:    * Erline Levine, MD - Primary  PHYSICIAN ASSISTANT: Ditty, MD  ASSISTANTS: Poteat, RN   ANESTHESIA:   general  EBL:  Total I/O In: 1800 [I.V.:1800] Out: [id number] [Urine:1000; Blood:50]  BLOOD ADMINISTERED:none  DRAINS: none   LOCAL MEDICATIONS USED:  LIDOCAINE   SPECIMEN:  Excision  DISPOSITION OF SPECIMEN:  PATHOLOGY  COUNTS:  YES  TOURNIQUET:  * No tourniquets in log *  DICTATION: DICTATION: Patient is 55 year old woman with a  left cerebellar metastatic brain tumor.  She has a history of breast cancer.  Patient has developed nausea and vomiting and early hydrocephalus.  It was elected to take patient to surgery for sub-occipital craniectomy for metastasis using BrainLab navigation.  Procedure:  Following smooth intubation, patient was placed in prone position on chest rolls with 3 pin head fixation. Occipital region was shaved and prepped and draped in usual sterile fashion with Duraprep after registering the patient with the [place] navigation system and marking the tumor.  Area of planned incision was infiltrated with lidocaine. A linear incision was made in the suboccipital region to expose calvarium and extended to the left. High speed drill was used to perform left suboccipital craniectomy to expose the dura.  Dura was opened over the left cerebellar hemisphere.  The tumor came to the surface and did not appear to be attached to the Dura.  The tumor was separated from the brain and removed en bloc.   The tumor appeared gritty and was whitish and firm.    The entire tumor was removed.   Hemostasis  was assured with irrigation and Surgifoam.  Hemostasis was assured also with Surgifoam.  The brain was considerably more relaxed after tumor resection.  The evacuation cavity was lined with Surgifoam. The dura was patched with Dura-matrix and a Titanium plate was fashioned and affixed to the skull with screws.  the fascia was closed with 0 vicryl sutures, subcutaneous tissues were reapproximated with 2-0 vicryl sutures and the skin was re approximated with  A running 3-0 Nylon stitch.  A sterile occlusive dressing was placed.  Patient was returned to a supine position and taken out of pins and extubated in the operating room and taken to Recovery having tolerated her surgery well.  Counts were correct at the end of the case.   PLAN OF CARE: Admit to inpatient   PATIENT DISPOSITION:  PACU - hemodynamically stable.   Delay start of Pharmacological VTE agent (>24hrs) due to surgical blood loss or risk of bleeding: yes

## 2016-08-14 NOTE — Progress Notes (Signed)
Awake, alert, conversant.  MAEW.  No drift.  Doing well.

## 2016-08-14 NOTE — Interval H&P Note (Signed)
History and Physical Interval Note:  08/14/2016 7:24 AM  [name]  has presented today for surgery, with the diagnosis of brain mass  The various methods of treatment have been discussed with the patient and family. After consideration of risks, benefits and other options for treatment, the patient has consented to  Procedure(s) with comments: CRANIOTOMY TUMOR EXCISION WITH BRAINLAB (N/A) - CRANIOTOMY TUMOR EXCISION WITH BRAINLAB APPLICATION OF CRANIAL NAVIGATION (N/A) as a surgical intervention .  The patient's history has been reviewed, patient examined, no change in status, stable for surgery.  I have reviewed the patient's chart and labs.  Questions were answered to the patient's satisfaction.     [name]

## 2016-08-14 NOTE — Anesthesia Procedure Notes (Signed)
Central Venous Catheter Insertion Performed by: anesthesiologist Patient location: Pre-op. Preanesthetic checklist: patient identified, IV checked, site marked, risks and benefits discussed, surgical consent, monitors and equipment checked, pre-op evaluation, timeout performed and anesthesia consent Position: Trendelenburg Lidocaine 1% used for infiltration Landmarks identified and Seldinger technique used Catheter size: 8 Fr Central line was placed.Double lumen Procedure performed using ultrasound guided technique. Attempts: 1 Following insertion, dressing applied, line sutured and Biopatch. Post procedure assessment: blood return through all ports. Patient tolerated the procedure well with no immediate complications.       

## 2016-08-14 NOTE — Transfer of Care (Signed)
Immediate Anesthesia Transfer of Care Note  Patient: [name]  Procedure(s) Performed: Procedure(s): CRANIOTOMY TUMOR EXCISION WITH BRAINLAB (N/A) APPLICATION OF CRANIAL NAVIGATION (N/A)  Patient Location: PACU  Anesthesia Type:General  Level of Consciousness: awake, sedated, patient cooperative and responds to stimulation  Airway & Oxygen Therapy: Patient Spontanous Breathing and Patient connected to face mask oxygen  Post-op Assessment: Report given to RN, Post -op Vital signs reviewed and stable, Patient moving all extremities and Patient moving all extremities X 4  Post vital signs: Reviewed and stable  Last Vitals:  Vitals:   08/14/16 0621  BP: (!) 137/59  Pulse: 76  Resp: 16  Temp: 36.9 C    Last Pain: There were no vitals filed for this visit.       Complications: No apparent anesthesia complications

## 2016-08-14 NOTE — H&P (Signed)
Patient ID:   [id number] Patient: [name]  Date of Birth: 1961/09/11 Visit Type: Office Visit   Date: 08/10/2016 04:00 PM Provider: Marchia Meiers. Vertell Limber MD   This 55 year old female presents for Brain Metastasis.  History of Present Illness: 1.  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  Diagnosed with left breast cancer July 2016, she reported persistent headaches and CT imaging revealed a brain mass.  SRS is scheduled for this Wednesday followed by suboccipital craniotomy on Friday.  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 Minimal edema with no mass effect. The questioned additional 4 mm left cerebellar lesion by CT was artifact. No associated leptomeningeal, and no other dural or parenchymal metastatic disease identified.       PAST MEDICAL HISTORY, SURGICAL HISTORY, FAMILY HISTORY, SOCIAL HISTORY AND REVIEW OF SYSTEMS  08/10/2016, which I have signed.   MEDICATIONS(added, continued or stopped this visit): Started Medication Directions Instruction Stopped   cetirizine 10 mg tablet take 1 tablet by oral route  every day     Compazine 10 mg tablet take 1 tablet by oral route  every 6 hours as needed     diphenoxylate-atropine take 2 tablet by oral route 2 times every day as needed     Flovent Diskus 50 mcg/actuation powder for inhalation inhale 1 puff by inhalation route 2 times every day     gabapentin 300 mg capsule take 2 capsule by oral route 3 times every day     Kenalog apply by topical route 3 times every day to  the affected area(s)     lamotrigine 100 mg tablet take 1 tablet by oral route 2 times every day     lithium carbonate 300 mg capsule take 1 capsule by oral route 2 times every day     lorazepam 0.5 mg tablet take 1 tablet by oral route 3 times every day as needed     MONOVISC take as directed     ondansetron HCl 8 mg tablet take 1 tablet by oral route 3 times every day as needed     pantoprazole 40 mg tablet,delayed release take 1 tablet by oral route  every day     potassium chloride ER 10 mEq tablet,extended release take 1 tablet by oral route  every day with food     tizanidine 4 mg tablet take 1 tablet by oral route  4 times every day     tramadol 50 mg tablet take 1 tablet by oral route  every 6 hours as needed     trazodone 300 mg tablet take 1 tablet by oral route  every day with food     Vitamin D3 1,000 unit capsule Take 1 capsule daily       ALLERGIES: Ingredient Reaction Medication Name Comment  ERYTHROMYCIN BASE     MEPERIDINE HCL  Demerol    Reviewed, updated.    Vitals Date Temp F BP Pulse Ht In Wt Lb BMI BSA Pain Score  08/10/2016  121/74 89 65 158 26.29  1/10     PHYSICAL EXAM General Level of Distress:  no acute distress Overall Appearance: normal  Head and Face  Right Left  Fundoscopic Exam:  normal normal    Cardiovascular Cardiac: regular rate and rhythm without murmur  Right Left  Carotid Pulses: normal normal  Respiratory Lungs: clear to auscultation  Neurological Orientation: normal Recent and Remote Memory: normal Attention Span and Concentration:   normal Language: normal Fund of Knowledge: normal  Right Left Sensation: normal normal Upper Extremity Coordination: normal normal  Lower Extremity Coordination: normal normal  Musculoskeletal Gait and Station: normal  Right Left Upper Extremity Muscle Strength: normal normal Lower Extremity Muscle Strength: normal normal Upper Extremity Muscle Tone:  normal normal Lower Extremity  Muscle Tone: normal normal  Motor Strength Upper and lower extremity motor strength was tested in the clinically pertinent muscles.     Deep Tendon Reflexes  Right Left Biceps: normal normal Triceps: normal normal Brachiloradialis: normal normal Patellar: normal normal Achilles: normal normal  Cranial Nerves II. Optic Nerve/Visual Fields: normal III. Oculomotor: normal IV. Trochlear: normal V. Trigeminal: normal VI. Abducens: normal VII. Facial: normal VIII. Acoustic/Vestibular: normal IX. Glossopharyngeal: normal X. Vagus: normal XI. Spinal Accessory: normal XII. Hypoglossal: normal  Motor and other Tests Lhermittes: negative Rhomberg: negative Pronator drift: absent     Right Left Hoffman's: normal normal Clonus: normal normal Babinski: normal normal   Additional Findings:  normal fundoscopic exam, LE strength is normal,symmetric reflexes, no pronator drift   DIAGNOSTIC RESULTS 07/23/16 MRI: Solitary 2.4 cm left cerebellar metastasis which appears to involve the overlying dura (series 17, image 25). Minimal edema with no mass effect. The questioned additional 4 mm left cerebellar lesion by CT was artifact. No associated leptomeningeal, and no other dural or parenchymal metastatic disease identified.     IMPRESSION The patient has been experiencing headaches and occasional coordination issues. Her MRI reveals a solitary 2.4 cm left cerebellar metastasis. I recommend stereotactic radiosurgery and then suboccipital craniotomy with resection of brain tumor for definitive treatment of this lesion.  Assessment/Plan # Detail Type Description   1. Assessment Brain tumor (D49.6).       2. Assessment Malignant neoplasm of female breast, unspecified estrogen receptor status, unspecified laterality, unspecified site of breast (C50.919).       3. Assessment Brain metastasis (C79.31).         Schedule SRS for 9/13 and suboccipital craniotomy with resection of brain  tumor for 9/15. Nurse education given.              Provider:  Marchia Meiers. Vertell Limber MD  08/10/2016 04:51 PM Dictation edited by: Johnella Moloney    CC Providers: Kyung Rudd [address] [place], [place] [id number]-              Electronically signed by Marchia Meiers. Vertell Limber MD on 08/12/2016 08:56 AM

## 2016-08-15 MED ORDER — MIDAZOLAM HCL 2 MG/2ML IJ SOLN
2.0000 mg | Freq: Once | INTRAMUSCULAR | Status: AC
  Administered 2016-08-15: 2 mg via INTRAVENOUS
  Filled 2016-08-15: qty 2

## 2016-08-15 MED ORDER — FAMOTIDINE 20 MG PO TABS
20.0000 mg | ORAL_TABLET | Freq: Two times a day (BID) | ORAL | Status: DC
  Administered 2016-08-15 – 2016-08-16 (×3): 20 mg via ORAL
  Filled 2016-08-15 (×3): qty 1

## 2016-08-15 NOTE — Progress Notes (Signed)
Called MD and verified that patient is allowed to take showers while on unit. MD Ditty stated that it is okay for patient to take shower as long as incision does not get wet.

## 2016-08-15 NOTE — Progress Notes (Signed)
No acute events Neuro intact Bandage c/d/i TTF MRI today

## 2016-08-16 MED ORDER — HYDROCODONE-ACETAMINOPHEN 7.5-325 MG PO TABS: 1.0000 | ORAL_TABLET | Freq: Four times a day (QID) | ORAL | 0 refills | Status: DC | PRN

## 2016-08-16 MED ORDER — DEXAMETHASONE 4 MG PO TABS: 4.0000 mg | ORAL_TABLET | Freq: Two times a day (BID) | ORAL | 0 refills | Status: DC

## 2016-08-16 NOTE — Progress Notes (Signed)
Patient could not tolerate MRI due to anxiety AVSS Neuro intact Bandage with some dried blood but otherwise looks good Offered to allow her to stay one more day so she can have an MRI with anesthesia, she refused because of social issues D/c today

## 2016-08-16 NOTE — Discharge Summary (Signed)
Date of Admission: 08/14/2016  Date of Discharge: 08/16/16  Admission Diagnosis: Posterior fossa metastasis  Discharge Diagnosis: Same  Procedure Performed: Suboccipital craniectomy for resection of posterior fossa metastasis  Attending: Erline Levine, MD  Hospital Course:  The patient was admitted for the above listed operation and had an uncomplicated post-operative course.  She was unable to tolerate her post-operative MRI.  She was discharged in stable condition.  Follow up: 3 weeks    Medication List    TAKE these medications   cetirizine 10 MG tablet Commonly known as:  ZYRTEC Take 1 tablet (10 mg total) by mouth daily.   dexamethasone 4 MG tablet Commonly known as:  DECADRON Take 1 tablet (4 mg total) by mouth 2 (two) times daily.   diphenoxylate-atropine 2.5-0.025 MG tablet Commonly known as:  LOMOTIL Take 2 tablets by mouth 4 (four) times daily as needed for diarrhea or loose stools.   fluticasone 50 MCG/ACT nasal spray Commonly known as:  FLONASE Place 1 spray into the nose daily.   gabapentin 300 MG capsule Commonly known as:  NEURONTIN Take 2 capsules (600 mg total) by mouth 3 (three) times daily. 300 mg at night for one day, twice daily for one day, then three times daily   hyaluronate sodium Gel Apply 1 application topically.   HYDROcodone-acetaminophen 7.5-325 MG tablet Commonly known as:  NORCO Take 1-2 tablets by mouth every 6 (six) hours as needed for severe pain (give one tab, then if other tab is needed, may give 30 min later.).   ibuprofen 200 MG tablet Commonly known as:  ADVIL,MOTRIN Take 800 mg by mouth every 6 (six) hours as needed for mild pain.   IMODIUM PO Take 1 tablet by mouth daily as needed (diarhea).   lamoTRIgine 100 MG tablet Commonly known as:  LAMICTAL Take 1.5 tablets (150 mg total) by mouth 2 (two) times daily.   lidocaine-prilocaine cream Commonly known as:  EMLA Apply 1 application topically as needed.   lithium  carbonate 300 MG capsule Take 1 capsule (300 mg total) by mouth 2 (two) times daily with a meal.   LORazepam 0.5 MG tablet Commonly known as:  ATIVAN Take 1 tablet (0.5 mg total) by mouth every 8 (eight) hours. What changed:  when to take this  reasons to take this   ondansetron 8 MG tablet Commonly known as:  ZOFRAN Take 1 tablet (8 mg total) by mouth every 8 (eight) hours as needed for nausea or vomiting.   pantoprazole 40 MG tablet Commonly known as:  PROTONIX Take 1 tablet (40 mg total) by mouth daily.   polyethylene glycol powder powder Commonly known as:  GLYCOLAX/MIRALAX Take 17 g by mouth 2 (two) times daily as needed for mild constipation.   potassium chloride 10 MEQ tablet Commonly known as:  K-DUR Take 1 tablet (10 mEq total) by mouth daily.   prochlorperazine 10 MG tablet Commonly known as:  COMPAZINE Take 10 mg by mouth daily as needed for nausea.   tiZANidine 4 MG tablet Commonly known as:  ZANAFLEX Take 1 tablet (4 mg total) by mouth daily.   traMADol 50 MG tablet Commonly known as:  ULTRAM Take 1 tablet (50 mg total) by mouth every 6 (six) hours as needed.   trazodone 300 MG tablet Commonly known as:  DESYREL Take 1 tablet (300 mg total) by mouth at bedtime.   triamcinolone cream 0.5 % Commonly known as:  KENALOG Apply 1 application topically daily as needed.   trolamine salicylate  10 % cream Commonly known as:  ASPERCREME Apply 1 application topically as needed for muscle pain. Reported on 06/01/2016   varenicline 0.5 MG tablet Commonly known as:  CHANTIX Take 1 tablet (0.5 mg total) by mouth daily.   Vitamin D (Cholecalciferol) 1000 units Caps Take 1,000 Units by mouth daily.

## 2016-08-16 NOTE — Progress Notes (Signed)
Pt being discharged per orders from MD. Pt educated on discharge instructions. Pt verbalized understanding of instructions. All questions and concerns were addressed. Pt's IV was removed prior to discharge. Pt exited hospital via wheelchair. 

## 2016-08-17 ENCOUNTER — Encounter (HOSPITAL_COMMUNITY): Payer: Self-pay | Admitting: Neurosurgery

## 2016-08-17 ENCOUNTER — Other Ambulatory Visit: Payer: Self-pay | Admitting: *Deleted

## 2016-08-17 DIAGNOSIS — R110 Nausea: Secondary | ICD-10-CM

## 2016-08-17 MED ORDER — ONDANSETRON HCL 8 MG PO TABS: 8.0000 mg | ORAL_TABLET | Freq: Three times a day (TID) | ORAL | 1 refills | Status: DC | PRN

## 2016-08-17 MED ORDER — LORAZEPAM 0.5 MG PO TABS: 0.5000 mg | ORAL_TABLET | Freq: Three times a day (TID) | ORAL | 1 refills | Status: DC | PRN

## 2016-08-17 NOTE — Telephone Encounter (Signed)
Received vm call from pt asking for refill on her zofran & ativan.  OK to refill per Dr. [name].

## 2016-08-18 ENCOUNTER — Other Ambulatory Visit: Payer: Self-pay

## 2016-08-18 ENCOUNTER — Telehealth: Payer: Self-pay | Admitting: Oncology

## 2016-08-18 ENCOUNTER — Ambulatory Visit: Payer: Self-pay | Admitting: Oncology

## 2016-08-18 ENCOUNTER — Ambulatory Visit: Payer: Self-pay

## 2016-08-18 MED FILL — ONDANSETRON HCL 8 MG TABLET: 8 | 10 days supply | Qty: 30 | Fill #0

## 2016-08-18 MED FILL — LORazepam 0.5 MG TABS: 0.5 | 10 days supply | Qty: 30 | Fill #0

## 2016-08-18 NOTE — Telephone Encounter (Signed)
lvm to inform pt of r/s appt date/time per LOS

## 2016-08-19 ENCOUNTER — Other Ambulatory Visit: Payer: Self-pay | Admitting: *Deleted

## 2016-08-19 NOTE — Telephone Encounter (Signed)
Per follow up - lorazepam prescription has been called to pharmacy and available for pick up.

## 2016-08-20 ENCOUNTER — Encounter: Payer: Self-pay | Admitting: General Practice

## 2016-08-20 NOTE — Progress Notes (Signed)
[facility] Spiritual Care Note  [name] by phone to offer spiritual/emotional support post surgery.  [name] used opportunity well to process some of her feelings of fear, vulnerability, isolation, and practical problems (esp wanting a permanent place to live).  She verbalized how helpful it was to have someone to talk to and how difficult it has been for her to reach out because of so many stressors in her life.  We plan for me to f/u in infusion on 9/26 in order to meet in person and to strategize about tools for self-calming/coping with anxiety.    As always, please also page as needs arise/circumstances change.   [place], [place], [facility] Pager [phone] Voicemail [phone]

## 2016-08-24 ENCOUNTER — Telehealth: Payer: Self-pay | Admitting: *Deleted

## 2016-08-24 NOTE — Telephone Encounter (Signed)
  Oncology Nurse Navigator Documentation  Navigator Location: [facility] (08/24/16 1000) Navigator Encounter Type: Telephone (08/24/16 1000) Telephone: Incoming Call;Appt Confirmation/Clarification (08/25/16- lab. GM, herceptin) (08/24/16 1000)                                        Time Spent with Patient: 30 (08/24/16 1000)

## 2016-08-25 ENCOUNTER — Ambulatory Visit (HOSPITAL_BASED_OUTPATIENT_CLINIC_OR_DEPARTMENT_OTHER): Payer: Medicaid Other | Admitting: Oncology

## 2016-08-25 ENCOUNTER — Encounter: Payer: Self-pay | Admitting: General Practice

## 2016-08-25 ENCOUNTER — Other Ambulatory Visit (HOSPITAL_BASED_OUTPATIENT_CLINIC_OR_DEPARTMENT_OTHER): Payer: Medicaid Other

## 2016-08-25 ENCOUNTER — Ambulatory Visit (HOSPITAL_BASED_OUTPATIENT_CLINIC_OR_DEPARTMENT_OTHER): Payer: Medicaid Other

## 2016-08-25 VITALS — BP 144/69 | HR 77 | Temp 97.7°F | Resp 18 | Ht 65.0 in | Wt 152.8 lb

## 2016-08-25 DIAGNOSIS — F319 Bipolar disorder, unspecified: Secondary | ICD-10-CM

## 2016-08-25 DIAGNOSIS — Z5112 Encounter for antineoplastic immunotherapy: Secondary | ICD-10-CM

## 2016-08-25 DIAGNOSIS — C7951 Secondary malignant neoplasm of bone: Secondary | ICD-10-CM | POA: Diagnosis not present

## 2016-08-25 DIAGNOSIS — C773 Secondary and unspecified malignant neoplasm of axilla and upper limb lymph nodes: Secondary | ICD-10-CM | POA: Diagnosis not present

## 2016-08-25 DIAGNOSIS — Z79811 Long term (current) use of aromatase inhibitors: Secondary | ICD-10-CM

## 2016-08-25 DIAGNOSIS — C7931 Secondary malignant neoplasm of brain: Secondary | ICD-10-CM | POA: Diagnosis not present

## 2016-08-25 DIAGNOSIS — Z170 Estrogen receptor positive status [ER+]: Secondary | ICD-10-CM

## 2016-08-25 DIAGNOSIS — Z720 Tobacco use: Secondary | ICD-10-CM

## 2016-08-25 DIAGNOSIS — R197 Diarrhea, unspecified: Secondary | ICD-10-CM

## 2016-08-25 DIAGNOSIS — C50312 Malignant neoplasm of lower-inner quadrant of left female breast: Secondary | ICD-10-CM

## 2016-08-25 LAB — COMPREHENSIVE METABOLIC PANEL
ALT: 37 U/L (ref 0–55)
AST: 14 U/L (ref 5–34)
Albumin: 3.8 g/dL (ref 3.5–5.0)
Alkaline Phosphatase: 50 U/L (ref 40–150)
Anion Gap: 11 mEq/L (ref 3–11)
BUN: 16.8 mg/dL (ref 7.0–26.0)
CO2: 22 mEq/L (ref 22–29)
Calcium: 9.3 mg/dL (ref 8.4–10.4)
Chloride: 106 mEq/L (ref 98–109)
Creatinine: 1 mg/dL (ref 0.6–1.1)
EGFR: 62 mL/min/{1.73_m2} — ABNORMAL LOW (ref >90)
Glucose: 151 mg/dl — ABNORMAL HIGH (ref 70–140)
Potassium: 3.6 mEq/L (ref 3.5–5.1)
Sodium: 139 mEq/L (ref 136–145)
Total Bilirubin: <0.3 mg/dL (ref 0.20–1.20)
Total Protein: 6.3 g/dL — ABNORMAL LOW (ref 6.4–8.3)

## 2016-08-25 LAB — CBC WITH DIFFERENTIAL/PLATELET
BASO%: 0.1 % (ref 0.0–2.0)
Basophils Absolute: 0 10*3/uL (ref 0.0–0.1)
EOS%: 0.2 % (ref 0.0–7.0)
Eosinophils Absolute: 0 10*3/uL (ref 0.0–0.5)
HCT: 39.1 % (ref 34.8–46.6)
HGB: 13.2 g/dL (ref 11.6–15.9)
LYMPH%: 7.5 % — ABNORMAL LOW (ref 14.0–49.7)
MCH: 31.4 pg (ref 25.1–34.0)
MCHC: 33.8 g/dL (ref 31.5–36.0)
MCV: 93.1 fL (ref 79.5–101.0)
MONO#: 0.5 10*3/uL (ref 0.1–0.9)
MONO%: 4.4 % (ref 0.0–14.0)
NEUT#: 9.6 10*3/uL — ABNORMAL HIGH (ref 1.5–6.5)
NEUT%: 87.8 % — ABNORMAL HIGH (ref 38.4–76.8)
Platelets: 188 10*3/uL (ref 145–400)
RBC: 4.2 10*6/uL (ref 3.70–5.45)
RDW: 15.6 % — ABNORMAL HIGH (ref 11.2–14.5)
WBC: 10.9 10*3/uL — ABNORMAL HIGH (ref 3.9–10.3)
lymph#: 0.8 10*3/uL — ABNORMAL LOW (ref 0.9–3.3)
nRBC: 0 % (ref 0–0)

## 2016-08-25 MED ORDER — VARENICLINE TARTRATE 0.5 MG PO TABS: 0.5000 mg | ORAL_TABLET | Freq: Every day | ORAL | 3 refills | Status: DC

## 2016-08-25 MED ORDER — SODIUM CHLORIDE 0.9 % IV SOLN
Freq: Once | INTRAVENOUS | Status: AC
  Administered 2016-08-25: 15:00:00 via INTRAVENOUS

## 2016-08-25 MED ORDER — DIPHENHYDRAMINE HCL 25 MG PO CAPS
ORAL_CAPSULE | ORAL | Status: AC
  Filled 2016-08-25: qty 1

## 2016-08-25 MED ORDER — TRASTUZUMAB CHEMO 150 MG IV SOLR
6.0000 mg/kg | Freq: Once | INTRAVENOUS | Status: AC
  Administered 2016-08-25: 420 mg via INTRAVENOUS
  Filled 2016-08-25: qty 20

## 2016-08-25 MED ORDER — ACETAMINOPHEN 325 MG PO TABS
ORAL_TABLET | ORAL | Status: AC
  Filled 2016-08-25: qty 2

## 2016-08-25 MED ORDER — LORAZEPAM 0.5 MG PO TABS: 0.5000 mg | ORAL_TABLET | Freq: Three times a day (TID) | ORAL | 1 refills | Status: DC | PRN

## 2016-08-25 MED ORDER — DEXAMETHASONE 4 MG PO TABS: 2.0000 mg | ORAL_TABLET | Freq: Two times a day (BID) | ORAL | 0 refills | Status: DC

## 2016-08-25 MED ORDER — DIPHENOXYLATE-ATROPINE 2.5-0.025 MG PO TABS: 2.0000 | ORAL_TABLET | Freq: Four times a day (QID) | ORAL | 0 refills | Status: DC | PRN

## 2016-08-25 MED ORDER — ANASTROZOLE 1 MG PO TABS: 1.0000 mg | ORAL_TABLET | Freq: Every day | ORAL | 1 refills | Status: DC

## 2016-08-25 MED ORDER — DENOSUMAB 120 MG/1.7ML ~~LOC~~ SOLN
120.0000 mg | Freq: Once | SUBCUTANEOUS | Status: AC
  Administered 2016-08-25: 120 mg via SUBCUTANEOUS
  Filled 2016-08-25: qty 1.7

## 2016-08-25 MED ORDER — HEPARIN SOD (PORK) LOCK FLUSH 100 UNIT/ML IV SOLN
500.0000 [IU] | Freq: Once | INTRAVENOUS | Status: AC | PRN
  Administered 2016-08-25: 500 [IU]
  Filled 2016-08-25: qty 5

## 2016-08-25 MED ORDER — SODIUM CHLORIDE 0.9% FLUSH
10.0000 mL | INTRAVENOUS | Status: DC | PRN
  Administered 2016-08-25: 10 mL
  Filled 2016-08-25: qty 10

## 2016-08-25 MED ORDER — DIPHENHYDRAMINE HCL 25 MG PO CAPS: 25.0000 mg | ORAL_CAPSULE | Freq: Once | ORAL | Status: DC

## 2016-08-25 MED ORDER — ACETAMINOPHEN 325 MG PO TABS
650.0000 mg | ORAL_TABLET | Freq: Once | ORAL | Status: AC
  Administered 2016-08-25: 650 mg via ORAL

## 2016-08-25 MED FILL — DIPHENOXYLATE-ATROP 2.5-0.0: 2.5-0.025 | 10 days supply | Qty: 40 | Fill #0

## 2016-08-25 MED FILL — CHANTIX 0.5 MG TABLET: 0.5 | 30 days supply | Qty: 30 | Fill #0

## 2016-08-25 MED FILL — DEXAMETHASONE 4 MG TABLET: 4 | 60 days supply | Qty: 60 | Fill #0

## 2016-08-25 MED FILL — ANASTROZOLE 1 MG TABLET: 1 | 90 days supply | Qty: 90 | Fill #0

## 2016-08-25 NOTE — Progress Notes (Signed)
[facility] Spiritual Care Note  Followed up with Warren Lacy in infusion for further support.  Served as witness as she shared and processed some of her story.  Per pt, her recovery/sobriety has opened doors to further healing and rebuilding of significant family relationships.  We talked about how recovery tools are also helpful in coping and making meaning through cancer.  Provided detailed introduction to [employer] resources as additional tools for processing, meaning-making, and reflection.  Aldeen's self-concept as a strong person who has made it through many hard things is helping to fuel her through recent brain mets/surgery/healing.   Per pt, she has significant anxiety and claustrophobia, which causes her to worry about masking fitting and radiation treatment; she plans to call me once fitting as scheduled in order to arrange supportive presence during that process.   [place], [place], [facility] Pager [phone] Voicemail [phone]

## 2016-08-25 NOTE — Patient Instructions (Signed)
[facility] Discharge Instructions for Patients Receiving Chemotherapy  Today you received the following chemotherapy agents:  Herceptin  To help prevent nausea and vomiting after your treatment, we encourage you to take your nausea medication as prescribed.   If you develop nausea and vomiting that is not controlled by your nausea medication, call the clinic.   BELOW ARE SYMPTOMS THAT SHOULD BE REPORTED IMMEDIATELY:  *FEVER GREATER THAN 100.5 F  *CHILLS WITH OR WITHOUT FEVER  NAUSEA AND VOMITING THAT IS NOT CONTROLLED WITH YOUR NAUSEA MEDICATION  *UNUSUAL SHORTNESS OF BREATH  *UNUSUAL BRUISING OR BLEEDING  TENDERNESS IN MOUTH AND THROAT WITH OR WITHOUT PRESENCE OF ULCERS  *URINARY PROBLEMS  *BOWEL PROBLEMS  UNUSUAL RASH Items with * indicate a potential emergency and should be followed up as soon as possible.  Feel free to call the clinic you have any questions or concerns. The clinic phone number is [phone].  Please show the CHEMO ALERT CARD at check-in to the Emergency Department and triage nurse.   

## 2016-08-25 NOTE — Progress Notes (Signed)
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 By then she had developed left breast and left axillary swelling (June 2016). She presented to the local emergency room and had a chest CT scan 06/06/2015 which showed a nodule in the left breast measuring 0.9 cm and questionable left axillary adenopathy. She then proceeded to bilateral diagnostic mammography and left breast ultrasonography 06/19/2015. There were no prior films for comparison (last mammography 12 years prior).. The breast density was category C. Mammography showed in the left breast upper inner quadrant a 7 cm area including a small mass and significant pleomorphic calcifications. Ultrasonography defined the mass as measuring 1.2 cm. The left axilla appeared unremarkable. There was significant skin edema.  Biopsy of the left breast mass 06/19/2015 showed (SP [phone]) an invasive ductal carcinoma, grade 2, estrogen receptor 83% positive, progesterone receptor 26% positive, and HER-2 amplified by immunohistochemstry with a 3+ reading. The patient had biopsies of a separate area in the left breast August of the same year and this showed atypical ductal hyperplasia. (SP [id number]).  Accordingly after appropriate discussion on 08/21/2015 the patient proceeded to left mastectomy with  left axillary sentinel lymph node sampling, which, since the lymph nodes were positive, extended to the procedure to left axillary lymph node dissection. The pathology (SP [id number]) showed an invasive ductal carcinoma, grade 3, measuring in excess of 9 cm. There were also skin satellites, not contiguous with the invasive carcinoma. Margins were clear and ample. There was evidence of lymphovascular invasion. A total of 15 lymph nodes were removed, including 5 sentinel lymph nodes, all of which were positive, so that the final total was 14 out of 15 lymph nodes involved by tumor. There was evidence of extranodal extension. The final pathology was pT4b pN2a, stage IIIB  CA-27-29 and CEA 09/19/2015 were non-informative October 2016.  Unfortunately CT scans of the chest abdomen and pelvis 09/16/2015 showed bony metastases to the right scapula, left iliac crest, and also L4 and T-spine. There were questionable liver cysts which on repeat CT scan 03/02/2016 appear to be a little bit more well-defined, possibly a little larger. There were also some possible right upper lobe lung lesions.  Adjuvant treatment consisted of docetaxel, trastuzumab and pertuzumab, with the final (6th) docetaxel dose given 02/11/2016. She continues on trastuzumab and pertuzumab, with the 11th cycle given 05/05/2016. Echocardiogram 02/26/2016 showed an ejection fraction of 55%. She receives denosumab/Xgeva every 6 weeks.. She also receives radiation, started 06/09/201, to be completed 06/26/2016.  Her subsequent history is as detailed below  INTERVAL HISTORY: Matty returns today for follow-up of her metastatic breast cancer. At the time of restaging studies here she was found to have a left cerebellar lesion. 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 This appeared to involve the overlying dura. On September 15 17 she underwent  craniotomy with excision of this  lesion, and the final pathology ([id number]) showed metastatic carcinoma compatible with metastatic breast cancer. The tumor was cytokeratin 7 positive but estrogen and progesterone receptor negative as well as gross cystic disease fluid protein negative. SRS is one for this area through the brain treatment group locally.   REVIEW OF SYSTEMS: Adalia is feeling a bit more secure in her current circumstances. She is living in a group home and she says the other 2 people living there are reliable. She is unable to work at present. She has very little energy. She does her own cooking and cleaning and at this group home. She is working hard to stop smoking and asked me if I would be willing to increase her Chantix dose. She has not yet hooked up with family services. She is having significant insomnia problems. She is also having severe diarrhea which is persisting beyond the usual. A time when it is due to pertuzumab. A detailed review of systems today was otherwise stable  PAST MEDICAL HISTORY: Past Medical History:  Diagnosis Date  . Alcohol abuse   . Anemia    during chemo  . Anxiety    At age 75  . Arthritis Dx 2010  . Bipolar disorder ([place])   . Cancer (HCC)    breast  . Chronic pain   . Depression   . Fibromyalgia Dx 2005  . GERD (gastroesophageal reflux disease)   . Headache    hx  migraines  . Opiate dependence ([place])   . Port-a-cath in place   . PTSD (post-traumatic stress disorder)     PAST SURGICAL HISTORY: Past Surgical History:  Procedure Laterality Date  . APPLICATION OF CRANIAL NAVIGATION N/A 08/14/2016   Procedure: APPLICATION OF CRANIAL NAVIGATION;  Surgeon: Erline Levine, MD;  Location: [facility];  Service: Neurosurgery;  Laterality: N/A;  . BREAST RECONSTRUCTION Left    with silicone implant  . CRANIOTOMY N/A 08/14/2016   Procedure: CRANIOTOMY TUMOR EXCISION WITH Lucky Rathke;  Surgeon: Erline Levine, MD;  Location: [facility];  Service: Neurosurgery;  Laterality: N/A;    . FIBULA FRACTURE SURGERY    . MASTECTOMY Left   . RADIOLOGY WITH ANESTHESIA N/A 07/23/2016   Procedure: MRI OF BRAIN WITH AND WITHOUT;  Surgeon: Medication Radiologist, MD;  Location: [facility];  Service: Radiology;  Laterality: N/A;  . right power port placement Right     FAMILY HISTORY Family History  Problem Relation Age of Onset  . Diabetes Mother   . Bipolar disorder Mother   . CAD Father   The patient's father still living, age 71, in [place]. He had prostate cancer at some point in the past. The patient's mother died at age 33 from complications of diabetes. The patient had no brothers, 2 sisters. A paternal grandmother had lung cancer in the setting of tobacco abuse. There is no other history of cancer in the family to her knowledge  GYNECOLOGIC HISTORY:  No LMP recorded. Patient has had an ablation. Menarche approximately age 52. First live birth in [birth year]. The patient is GX P2. She underwent endometrial ablation in 2016.  SOCIAL HISTORY:  The patient is originally from [place]. She has lived in [place] before but more recently was in [place]. She is back here because she could not afford her rent in [place]. She is living here and a temporary situation. She has divorced. HER-2 children are [name] who lives in [place] and works as a Development worker, community, and [place] who also  lives in [place] and works as a Catering manager. The patient has a grandchild, 38 years old as of July 2017, living in [place] with his mother. The patient has not established herself with a local church yet.    ADVANCED DIRECTIVES: Not in place; at the 06/03/2016 visit the patient was given the appropriate forms to complete and notarize at her discretion   HEALTH MAINTENANCE: Social History  Substance Use Topics  . Smoking status: Current Every Day Smoker    Packs/day: 0.50    Types: Cigarettes  . Smokeless tobacco: Never Used     Comment: Pt is on Chantix at present time  . Alcohol use Yes      Comment: no ETOH since 08/22/12     Colonoscopy:  PAP:  Bone density:   Allergies  Allergen Reactions  . Demerol Itching and Nausea And Vomiting  . Erythromycin Rash    Can take zpak    Current Outpatient Prescriptions  Medication Sig Dispense Refill  . cetirizine (ZYRTEC) 10 MG tablet Take 1 tablet (10 mg total) by mouth daily. 30 tablet 11  . dexamethasone (DECADRON) 4 MG tablet Take 1 tablet (4 mg total) by mouth 2 (two) times daily. 60 tablet 0  . diphenoxylate-atropine (LOMOTIL) 2.5-0.025 MG tablet Take 2 tablets by mouth 4 (four) times daily as needed for diarrhea or loose stools. 40 tablet 0  . fluticasone (FLONASE) 50 MCG/ACT nasal spray Place 1 spray into the nose daily.    [name] gabapentin (NEURONTIN) 300 MG capsule Take 2 capsules (600 mg total) by mouth 3 (three) times daily. 300 mg at night for one day, twice daily for one day, then three times daily 180 capsule 2  . hyaluronate sodium (RADIAPLEXRX) GEL Apply 1 application topically.    [name] HYDROcodone-acetaminophen (NORCO) 7.5-325 MG tablet Take 1-2 tablets by mouth every 6 (six) hours as needed for severe pain (give one tab, then if other tab is needed, may give 30 min later.). 60 tablet 0  . ibuprofen (ADVIL,MOTRIN) 200 MG tablet Take 800 mg by mouth every 6 (six) hours as needed for mild pain.    [name] lamoTRIgine (LAMICTAL) 100 MG tablet Take 1.5 tablets (150 mg total) by mouth 2 (two) times daily. 60 tablet 3  . lidocaine-prilocaine (EMLA) cream Apply 1 application topically as needed. 30 g 0  . lithium carbonate 300 MG capsule Take 1 capsule (300 mg total) by mouth 2 (two) times daily with a meal. 60 capsule 3  . Loperamide HCl (IMODIUM PO) Take 1 tablet by mouth daily as needed (diarhea).    . LORazepam (ATIVAN) 0.5 MG tablet Take 1 tablet (0.5 mg total) by mouth every 8 (eight) hours as needed for anxiety. 30 tablet 1  . ondansetron (ZOFRAN) 8 MG tablet Take 1 tablet (8 mg total) by mouth every 8 (eight) hours as needed for  nausea or vomiting. 30 tablet 1  . pantoprazole (PROTONIX) 40 MG tablet Take 1 tablet (40 mg total) by mouth daily. 30 tablet 3  . polyethylene glycol powder (GLYCOLAX/MIRALAX) powder Take 17 g by mouth 2 (two) times daily as needed for mild constipation. (Patient not taking: Reported on 08/13/2016) 3350 g 3  . potassium chloride (K-DUR) 10 MEQ tablet Take 1 tablet (10 mEq total) by mouth daily. 30 tablet 3  . prochlorperazine (COMPAZINE) 10 MG tablet Take 10 mg by mouth daily as needed for nausea.     [name] tiZANidine (ZANAFLEX) 4 MG tablet Take 1 tablet (4 mg total)  by mouth daily. 30 tablet 0  . traMADol (ULTRAM) 50 MG tablet Take 1 tablet (50 mg total) by mouth every 6 (six) hours as needed. 60 tablet 0  . trazodone (DESYREL) 300 MG tablet Take 1 tablet (300 mg total) by mouth at bedtime. 30 tablet 3  . triamcinolone cream (KENALOG) 0.5 % Apply 1 application topically daily as needed.  1  . trolamine salicylate (ASPERCREME) 10 % cream Apply 1 application topically as needed for muscle pain. Reported on 06/01/2016    . varenicline (CHANTIX) 0.5 MG tablet Take 1 tablet (0.5 mg total) by mouth daily. 30 tablet 3  . Vitamin D, Cholecalciferol, 1000 units CAPS Take 1,000 Units by mouth daily.     No current facility-administered medications for this visit.     nBP: (!) 144/69  Pulse: 77  Resp: 18  Temp: 97.7 F (36.5 C)     Body mass index is 25.43 kg/m.    ECOG FS:1 - Symptomatic but completely ambulatory  The head is atraumatic, with the posterior nuchal incision healing very nicely and concealed by hair. Sclerae unicteric, EOMs intact Oropharynx clear and moist No cervical or supraclavicular adenopathy Lungs no rales or rhonchi Heart regular rate and rhythm Abd soft, nontender, positive bowel sounds MSK no focal spinal tenderness, no upper extremity lymphedema Neuro: nonfocal, well oriented, appropriate affect Breasts: The right breast is benign. The left breast is status post mastectomy  and radiation. There is no evidence of local recurrence. The left axilla is benign.   LAB RESULTS:  CMP     Component Value Date/Time   NA 140 08/13/2016 0926   NA 139 07/28/2016 1118   K 4.3 08/13/2016 0926   K 4.1 07/28/2016 1118   CL 107 08/13/2016 0926   CO2 25 08/13/2016 0926   CO2 26 07/28/2016 1118   GLUCOSE 96 08/13/2016 0926   GLUCOSE 91 07/28/2016 1118   BUN 8 08/13/2016 0926   BUN 8.6 07/28/2016 1118   CREATININE 0.94 08/13/2016 0926   CREATININE 0.8 07/28/2016 1118   CALCIUM 9.2 08/13/2016 0926   CALCIUM 9.7 07/28/2016 1118   PROT 6.7 07/28/2016 1118   ALBUMIN 4.0 07/28/2016 1118   AST 18 07/28/2016 1118   ALT 13 07/28/2016 1118   ALKPHOS 50 07/28/2016 1118   BILITOT <0.30 07/28/2016 1118   GFRNONAA >60 08/13/2016 0926   GFRAA >60 08/13/2016 0926    INo results found for: SPEP, UPEP  Lab Results  Component Value Date   WBC 5.0 08/13/2016   NEUTROABS 4.2 07/28/2016   HGB 12.9 08/13/2016   HCT 40.8 08/13/2016   MCV 96.9 08/13/2016   PLT 171 08/13/2016      Chemistry      Component Value Date/Time   NA 140 08/13/2016 0926   NA 139 07/28/2016 1118   K 4.3 08/13/2016 0926   K 4.1 07/28/2016 1118   CL 107 08/13/2016 0926   CO2 25 08/13/2016 0926   CO2 26 07/28/2016 1118   BUN 8 08/13/2016 0926   BUN 8.6 07/28/2016 1118   CREATININE 0.94 08/13/2016 0926   CREATININE 0.8 07/28/2016 1118      Component Value Date/Time   CALCIUM 9.2 08/13/2016 0926   CALCIUM 9.7 07/28/2016 1118   ALKPHOS 50 07/28/2016 1118   AST 18 07/28/2016 1118   ALT 13 07/28/2016 1118   BILITOT <0.30 07/28/2016 1118       No results found for: LABCA2  No components found for: OMVEH209  No  results for input(s): INR in the last 168 hours.  Urinalysis    Component Value Date/Time   COLORURINE YELLOW 04/18/2013 [place] 04/18/2013 1222   LABSPEC 1.011 04/18/2013 1222   PHURINE 5.5 04/18/2013 1222   GLUCOSEU NEGATIVE 04/18/2013 1222   HGBUR TRACE  (A) 04/18/2013 1222   BILIRUBINUR negative 03/28/2015 1321   KETONESUR NEGATIVE 04/18/2013 1222   PROTEINUR negative 03/28/2015 1321   PROTEINUR NEGATIVE 04/18/2013 1222   UROBILINOGEN 0.2 03/28/2015 1321   UROBILINOGEN 0.2 04/18/2013 1222   NITRITE negative 03/28/2015 1321   NITRITE NEGATIVE 04/18/2013 1222   LEUKOCYTESUR Trace 03/28/2015 1321     STUDIES: Dg Chest Port 1 View  Result Date: 08/14/2016 CLINICAL DATA:  Central line placement EXAM: PORTABLE CHEST 1 VIEW COMPARISON:  Chest CT 06/17/2016 FINDINGS: Right Port-A-Cath in place with the tip at the cavoatrial junction. Right internal jugular central line tip at the cavoatrial junction. No pneumothorax. Heart is normal size. Right lung is clear. Diffuse airspace opacity throughout the left lung concerning for pneumonia. No acute bony abnormality. IMPRESSION: Right central line tip at the cavoatrial junction.  No pneumothorax. Diffuse opacity throughout the left lung concerning for pneumonia. Electronically Signed   By: Rolm Baptise M.D.   On: 08/14/2016 11:11     ELIGIBLE FOR AVAILABLE RESEARCH PROTOCOL: no  ASSESSMENT: 55 y.o. 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  (2) status post left mastectomy and axillary lymph node dissection  with immediate expander placement 07/18/2015 for an mpT4 pN2,stage IIIB invasive ductal carcinoma, grade 3, with negative margins.  (a) definitive implant exchange to be scheduled in December   METASTATIC DISEASE: October 2016  (3) CT scan of the chest abdomen and pelvis  09/16/2015 shows metastatic lesions in the right scapula, left iliac crest, L4, and T spine. There were questionable liver cysts, with repeat CT scan 03/02/2016 showing possible right upper lobe lung lesions and possibly increased liver  lesions  (a) CT scan of the chest shows no measurable disease in the lungs or liver  (b) Bone scan July 2017 showed no evidence of bony metastatic disease   (c) head CT 07/08/2016 showed a cerebellar lesion, confirmed by MRI 07/23/2016, status post craniotomy 08/14/2016, confirming a metastatic deposit which was estrogen and progesterone receptor negative, with SRS pending; HER-2 testing has been requested  (4) received docetaxel every 3 weeks 6 together with trastuzumab and pertuzumab, last docetaxel dose 02/11/2016  (5) adjuvant radiation7/03/2016 to 06/26/2016 at [facility]: 1. The Left chest wall was treated to 23.4 Gy in 13 fractions at 1.8 Gy per fraction. 2. The Left chest wall was boosted to 10 Gy in 5 fractions at 2 Gy per fraction. 3. The Left Sclav/PAB was treated to 23.4 Gy in 13 fractions at 1.8 Gy per fraction.  Including the patient's treatment in [place] (received 15 fractions per Dr. Maryan Rued near [place], [place]), the patient received 50.4 Gy to the left chest wall and supraclavicular region.   (6) started trastuzumab and pertuzumab October 2016, continuing every 3 weeks,  (a) most recent echocardiogram 02/26/2016 showed a well preserved ejection fraction  (b) echocardiogram 07/01/2016 shows an ejection fraction in the 60-65%   (c) pertuzumab discontinued 08/25/2016 with uncontrolled diarrhea  (7) started denosumab/Xgeva October 2017, repeated every 6 weeks  (8) starting tamoxifen October 2017   (  9) history of bipolar disorder  (a) lithium level checked every 6 weeks  (10) mild anemia with a significant drop in the MCV, ferritin 10 06/03/2016,   (a) Feraheme given 06/12/2016 and 06/18/2016  (11) tobacco abuse: Chantix started 06/18/2016   PLAN: Ludmila is understandably concerned about her cerebellar metastatic disease. I reassured her that her 78 does a terrific job and several of my patients that have prolonged remissions with the treatment that she has received  and will be receiving. We will of course continue to follow that closely together with them  She has completed her local treatments and is now ready to start anti-estrogens. Unfortunately the central nervous system lesion proved to be estrogen and progesterone receptor negative. Nevertheless her original disease was estrogen receptor positive. I think this warrants for her to be on anti-estrogens and we discussed the different options. After much discussion we're going to give anastrozole and try. She has a good understanding of the possible toxicities, side effects and complications of this agent, which she will start next week. She will see me again in approximately 6 weeks to assess tolerance.  Aaralyn wanted me to increase her Chantix dose. We are also following her lithium levels. She understands these are not medications are generally prescribed and I do not think it is safe for her to rely on someone who is not familiar with these drugs to be monitoring her. She really needs to make contact with family service immediately so they can pick up on her treatment and follow-up. She tells me that she will do that this week.  At this point a repeat brain MRI is pending, to be [facility]. She will call with any problems related to the anastrozole. She has had an excellent response to [facility] and her anemia has resolved We are continuing the trastuzumab without pertuzumab because of the diarrhea and I am sending a C. difficile today just in case. We are continuing the denosumab/Xgeva every 6 weeks.  She will keep [place] posted in any other problems that may develop before the next visit here.  Chauncey Cruel, MD   08/25/2016 1:59 PM Medical Oncology and Hematology [facility] [address] [place], [place] [address] Tel. [phone]    Fax. [phone]

## 2016-08-26 LAB — LITHIUM LEVEL: Lithium: 0.6 mmol/L (ref 0.6–1.2)

## 2016-08-26 LAB — CANCER ANTIGEN 27.29: CA 27.29: 3.6 U/mL (ref 0.0–38.6)

## 2016-08-27 ENCOUNTER — Other Ambulatory Visit: Payer: Self-pay | Admitting: Radiation Therapy

## 2016-08-27 DIAGNOSIS — C7949 Secondary malignant neoplasm of other parts of nervous system: Secondary | ICD-10-CM

## 2016-08-27 DIAGNOSIS — C7931 Secondary malignant neoplasm of brain: Secondary | ICD-10-CM

## 2016-08-27 NOTE — Addendum Note (Signed)
Encounter addended by: Rylynne Schicker B Kenetra Hildenbrand, RN on: 08/27/2016  7:59 AM<BR>    Actions taken: Charge Capture section accepted

## 2016-08-28 ENCOUNTER — Telehealth: Payer: Self-pay | Admitting: *Deleted

## 2016-08-28 NOTE — Telephone Encounter (Signed)
  Oncology Nurse Navigator Documentation  Navigator Location: [facility]-Med Onc (08/28/16 1500) Navigator Encounter Type: Telephone (08/28/16 1500) Telephone: Lahoma Crocker Call;Appt Confirmation/Clarification (08/28/16 1500)  Future appts for MRI and SIM                                      Time Spent with Patient: 30 (08/28/16 1500)

## 2016-08-31 ENCOUNTER — Other Ambulatory Visit (HOSPITAL_COMMUNITY)
Admission: RE | Admit: 2016-08-31 | Discharge: 2016-08-31 | Disposition: A | Discharge status: Home / Self Care | Payer: Medicaid Other | Source: Ambulatory Visit | Attending: Oncology | Admitting: Oncology

## 2016-08-31 DIAGNOSIS — C50312 Malignant neoplasm of lower-inner quadrant of left female breast: Secondary | ICD-10-CM | POA: Insufficient documentation

## 2016-08-31 LAB — C DIFFICILE QUICK SCREEN W PCR REFLEX
C Diff antigen: NEGATIVE
C Diff interpretation: NOT DETECTED
C Diff toxin: NEGATIVE

## 2016-08-31 MED FILL — lamoTRIgine 100 MG TABS: 100 | 30 days supply | Qty: 90 | Fill #2

## 2016-08-31 MED FILL — PANTOPRAZOLE SOD DR 40 MG T: 40 | 30 days supply | Qty: 30 | Fill #0

## 2016-09-07 ENCOUNTER — Telehealth: Payer: Self-pay | Admitting: Radiation Therapy

## 2016-09-07 ENCOUNTER — Encounter (HOSPITAL_COMMUNITY): Payer: Self-pay | Admitting: *Deleted

## 2016-09-07 MED FILL — LITHIUM CARBONATE 300 MG CA: 300 | 30 days supply | Qty: 60 | Fill #2

## 2016-09-07 NOTE — Telephone Encounter (Signed)
[name] called concerned that she has not heard from anyone with instructions about tomorrow's MRI scan.   I reached out to the MRI department and was instructed to give [name] these instructions.   Do not eat anything after 10pm this evening. Take all medication as prescribed today, but do not take any medication in the morning. Bring your medication with you and you will be instructed by anesthesia what you should or should not take at that time.  Arrive to [facility] at 6:00am to admissions for registration and check in.   I returned the call to Caniya and left these instructions on her voicemail since she did not answer.   Mont Dutton R.T.(R)(T) Special Procedures Navigator

## 2016-09-08 ENCOUNTER — Ambulatory Visit (HOSPITAL_COMMUNITY): Payer: Medicaid Other | Admitting: Anesthesiology

## 2016-09-08 ENCOUNTER — Ambulatory Visit (HOSPITAL_COMMUNITY)
Admission: RE | Admit: 2016-09-08 | Discharge: 2016-09-08 | Disposition: A | Discharge status: Home / Self Care | Payer: Medicaid Other | Source: Ambulatory Visit | Attending: Radiation Oncology | Admitting: Radiation Oncology

## 2016-09-08 ENCOUNTER — Ambulatory Visit: Payer: Self-pay

## 2016-09-08 ENCOUNTER — Other Ambulatory Visit: Payer: Self-pay

## 2016-09-08 ENCOUNTER — Encounter (HOSPITAL_COMMUNITY)
Admission: RE | Disposition: A | Discharge status: Home / Self Care | Payer: Self-pay | Source: Ambulatory Visit | Attending: Radiation Oncology

## 2016-09-08 ENCOUNTER — Encounter (HOSPITAL_COMMUNITY): Payer: Self-pay | Admitting: Surgery

## 2016-09-08 DIAGNOSIS — C7931 Secondary malignant neoplasm of brain: Secondary | ICD-10-CM | POA: Insufficient documentation

## 2016-09-08 DIAGNOSIS — C7949 Secondary malignant neoplasm of other parts of nervous system: Secondary | ICD-10-CM

## 2016-09-08 HISTORY — DX: Other complications of anesthesia, initial encounter: T88.59XA

## 2016-09-08 HISTORY — DX: Other specified postprocedural states: Z98.890

## 2016-09-08 HISTORY — DX: Adverse effect of unspecified anesthetic, initial encounter: T41.45XA

## 2016-09-08 HISTORY — PX: RADIOLOGY WITH ANESTHESIA: SHX6223

## 2016-09-08 HISTORY — DX: Nausea with vomiting, unspecified: R11.2

## 2016-09-08 SURGERY — RADIOLOGY WITH ANESTHESIA
Anesthesia: General

## 2016-09-08 MED ORDER — GADOBENATE DIMEGLUMINE 529 MG/ML IV SOLN
15.0000 mL | Freq: Once | INTRAVENOUS | Status: AC
  Administered 2016-09-08: 14 mL via INTRAVENOUS

## 2016-09-08 NOTE — Anesthesia Postprocedure Evaluation (Signed)
Anesthesia Post Note  Patient: [name]  Procedure(s) Performed: Procedure(s) (LRB): MRI OF BRAIN WITH AND WITHOUT CONTRAST (N/A)  Patient location during evaluation: PACU Anesthesia Type: General Level of consciousness: awake, awake and alert and oriented Pain management: pain level controlled Vital Signs Assessment: post-procedure vital signs reviewed and stable Respiratory status: spontaneous breathing, nonlabored ventilation and respiratory function stable Cardiovascular status: blood pressure returned to baseline Anesthetic complications: no    Last Vitals:  Vitals:   09/08/16 1030 09/08/16 1041  BP:  (!) 144/87  Pulse: (!) 53 60  Resp: 19 20  Temp: 36.3 C     Last Pain:  Vitals:   09/08/16 0700  TempSrc: Oral                 Makynzi Eastland COKER

## 2016-09-08 NOTE — Transfer of Care (Signed)
Immediate Anesthesia Transfer of Care Note  Patient: [name]  Procedure(s) Performed: Procedure(s): MRI OF BRAIN WITH AND WITHOUT CONTRAST (N/A)  Patient Location: PACU  Anesthesia Type:General  Level of Consciousness: awake, alert , oriented and patient cooperative  Airway & Oxygen Therapy: Patient Spontanous Breathing  Post-op Assessment: Report given to RN, Post -op Vital signs reviewed and stable and Patient moving all extremities  Post vital signs: Reviewed and stable  Last Vitals:  Vitals:   09/08/16 0700  BP: 106/71  Pulse: 64  Resp: 18  Temp: 36.9 C    Last Pain:  Vitals:   09/08/16 0700  TempSrc: Oral         Complications: No apparent anesthesia complications

## 2016-09-08 NOTE — Anesthesia Preprocedure Evaluation (Addendum)
Anesthesia Evaluation  Patient identified by MRN, date of birth, ID band Patient awake    Reviewed: Allergy & Precautions, NPO status , Patient's Chart, lab work & pertinent test results  Airway Mallampati: II  TM Distance: >3 FB Neck ROM: Full    Dental  (+) Teeth Intact   Pulmonary Current Smoker,    breath sounds clear to auscultation       Cardiovascular  Rhythm:Regular Rate:Normal     Neuro/Psych    GI/Hepatic   Endo/Other    Renal/GU      Musculoskeletal   Abdominal   Peds  Hematology   Anesthesia Other Findings   Reproductive/Obstetrics                            Anesthesia Physical Anesthesia Plan  ASA: III  Anesthesia Plan: General   Post-op Pain Management:    Induction: Intravenous  Airway Management Planned: LMA  Additional Equipment:   Intra-op Plan:   Post-operative Plan:   Informed Consent: I have reviewed the patients History and Physical, chart, labs and discussed the procedure including the risks, benefits and alternatives for the proposed anesthesia with the patient or authorized representative who has indicated his/her understanding and acceptance.   Dental advisory given  Plan Discussed with: CRNA and Anesthesiologist  Anesthesia Plan Comments:        Anesthesia Quick Evaluation

## 2016-09-09 ENCOUNTER — Encounter (HOSPITAL_COMMUNITY): Payer: Self-pay | Admitting: Radiology

## 2016-09-10 ENCOUNTER — Other Ambulatory Visit (HOSPITAL_BASED_OUTPATIENT_CLINIC_OR_DEPARTMENT_OTHER): Payer: Medicaid Other

## 2016-09-10 ENCOUNTER — Ambulatory Visit: Payer: Medicaid Other

## 2016-09-10 ENCOUNTER — Telehealth: Payer: Self-pay | Admitting: *Deleted

## 2016-09-10 ENCOUNTER — Ambulatory Visit
Admission: RE | Admit: 2016-09-10 | Discharge: 2016-09-10 | Disposition: A | Discharge status: Home / Self Care | Payer: Medicaid Other | Source: Ambulatory Visit | Attending: Radiation Oncology | Admitting: Radiation Oncology

## 2016-09-10 DIAGNOSIS — M549 Dorsalgia, unspecified: Secondary | ICD-10-CM | POA: Diagnosis not present

## 2016-09-10 DIAGNOSIS — Z79899 Other long term (current) drug therapy: Secondary | ICD-10-CM | POA: Diagnosis not present

## 2016-09-10 DIAGNOSIS — C50312 Malignant neoplasm of lower-inner quadrant of left female breast: Secondary | ICD-10-CM

## 2016-09-10 DIAGNOSIS — M1990 Unspecified osteoarthritis, unspecified site: Secondary | ICD-10-CM | POA: Diagnosis not present

## 2016-09-10 DIAGNOSIS — F4310 Post-traumatic stress disorder, unspecified: Secondary | ICD-10-CM | POA: Diagnosis not present

## 2016-09-10 DIAGNOSIS — F319 Bipolar disorder, unspecified: Secondary | ICD-10-CM | POA: Diagnosis not present

## 2016-09-10 DIAGNOSIS — C7931 Secondary malignant neoplasm of brain: Secondary | ICD-10-CM

## 2016-09-10 DIAGNOSIS — M797 Fibromyalgia: Secondary | ICD-10-CM | POA: Diagnosis not present

## 2016-09-10 DIAGNOSIS — F17210 Nicotine dependence, cigarettes, uncomplicated: Secondary | ICD-10-CM | POA: Diagnosis not present

## 2016-09-10 DIAGNOSIS — Z9889 Other specified postprocedural states: Secondary | ICD-10-CM | POA: Diagnosis not present

## 2016-09-10 DIAGNOSIS — C50912 Malignant neoplasm of unspecified site of left female breast: Secondary | ICD-10-CM | POA: Diagnosis not present

## 2016-09-10 DIAGNOSIS — G893 Neoplasm related pain (acute) (chronic): Secondary | ICD-10-CM | POA: Diagnosis not present

## 2016-09-10 DIAGNOSIS — R51 Headache: Secondary | ICD-10-CM | POA: Diagnosis not present

## 2016-09-10 DIAGNOSIS — K219 Gastro-esophageal reflux disease without esophagitis: Secondary | ICD-10-CM | POA: Diagnosis not present

## 2016-09-10 DIAGNOSIS — M25519 Pain in unspecified shoulder: Secondary | ICD-10-CM | POA: Diagnosis not present

## 2016-09-10 DIAGNOSIS — Z510 Encounter for antineoplastic radiation therapy: Secondary | ICD-10-CM | POA: Diagnosis not present

## 2016-09-10 DIAGNOSIS — Z833 Family history of diabetes mellitus: Secondary | ICD-10-CM | POA: Diagnosis not present

## 2016-09-10 DIAGNOSIS — D496 Neoplasm of unspecified behavior of brain: Secondary | ICD-10-CM

## 2016-09-10 LAB — CBC WITH DIFFERENTIAL/PLATELET
BASO%: 0 % (ref 0.0–2.0)
Basophils Absolute: 0 10*3/uL (ref 0.0–0.1)
EOS%: 0.2 % (ref 0.0–7.0)
Eosinophils Absolute: 0 10*3/uL (ref 0.0–0.5)
HCT: 41 % (ref 34.8–46.6)
HGB: 13.4 g/dL (ref 11.6–15.9)
LYMPH%: 6.7 % — ABNORMAL LOW (ref 14.0–49.7)
MCH: 32.3 pg (ref 25.1–34.0)
MCHC: 32.7 g/dL (ref 31.5–36.0)
MCV: 98.8 fL (ref 79.5–101.0)
MONO#: 0.2 10*3/uL (ref 0.1–0.9)
MONO%: 4.1 % (ref 0.0–14.0)
NEUT#: 4.8 10*3/uL (ref 1.5–6.5)
NEUT%: 89 % — ABNORMAL HIGH (ref 38.4–76.8)
Platelets: 125 10*3/uL — ABNORMAL LOW (ref 145–400)
RBC: 4.15 10*6/uL (ref 3.70–5.45)
RDW: 15.6 % — ABNORMAL HIGH (ref 11.2–14.5)
WBC: 5.4 10*3/uL (ref 3.9–10.3)
lymph#: 0.4 10*3/uL — ABNORMAL LOW (ref 0.9–3.3)

## 2016-09-10 LAB — COMPREHENSIVE METABOLIC PANEL
ALT: 32 U/L (ref 0–55)
AST: 17 U/L (ref 5–34)
Albumin: 3.8 g/dL (ref 3.5–5.0)
Alkaline Phosphatase: 46 U/L (ref 40–150)
Anion Gap: 9 mEq/L (ref 3–11)
BUN: 15.9 mg/dL (ref 7.0–26.0)
CO2: 28 mEq/L (ref 22–29)
Calcium: 9.6 mg/dL (ref 8.4–10.4)
Chloride: 103 mEq/L (ref 98–109)
Creatinine: 1 mg/dL (ref 0.6–1.1)
EGFR: 66 mL/min/{1.73_m2} — ABNORMAL LOW (ref >90)
Glucose: 136 mg/dl (ref 70–140)
Potassium: 4.2 mEq/L (ref 3.5–5.1)
Sodium: 140 mEq/L (ref 136–145)
Total Bilirubin: 0.39 mg/dL (ref 0.20–1.20)
Total Protein: 6.4 g/dL (ref 6.4–8.3)

## 2016-09-10 LAB — TECHNOLOGIST REVIEW

## 2016-09-10 MED ORDER — ALPRAZOLAM 0.5 MG PO TABS
1.0000 mg | ORAL_TABLET | Freq: Once | ORAL | Status: AC
  Administered 2016-09-10: 1 mg via ORAL
  Filled 2016-09-10 (×2): qty 2

## 2016-09-10 MED FILL — ALPRAZolam 1 MG TABS: 1 | 30 days supply | Qty: 60 | Fill #0

## 2016-09-10 NOTE — Telephone Encounter (Signed)
Gave in istructions and paper with decdron taper to Kathyleen, she gave verbal understanding- 12:49 PM

## 2016-09-10 NOTE — Progress Notes (Signed)
Ms. Buhr was given Xanax 1mg  po as ordered by [name] prior to simulation. Pt stated thanks.

## 2016-09-10 NOTE — Progress Notes (Signed)
  Radiation Oncology         [phone] ________________________________  Name: [name] MRN: [id number]  Date: 09/10/2016  DOB: [birth year]    Simulation and treatment planning note  DIAGNOSIS:     ICD-9-CM ICD-10-CM   1. Brain tumor ([place]) 239.6 D49.6 ALPRAZolam (XANAX) tablet 1 mg  2. Brain metastasis ([place]) 198.3 C79.31      The patient presented for simulation for the patient's upcoming course of whole brain radiation treatment. The patient was placed in a supine position and a customized thermoplastic head cast was constructed to aid in patient immobilization during the treatment. This complex treatment device will be used on a daily basis. In this fashion, a CT scan was obtained through the head and neck region and isocenter was placed near midline within the brain.  The patient will be planned to receive a course of whole brain radiation treatment to a dose of 35 gray in 14 fractions at 2.5 gray per fraction. To accomplish this, 2 customized blocks have been designed which corresponds to left and right whole brain radiation fields. These 2 complex treatment devices will be used on a daily basis during the course of radiation. A complex isodose plan is requested to insure that the target area is adequately covered in to facilitate optimization of the treatment plan. A forward planning technique will also be evaluated to determine if this approach significantly improves the plan.   ________________________________   Jodelle Gross, MD, PhD

## 2016-09-11 ENCOUNTER — Encounter: Payer: Self-pay | Admitting: *Deleted

## 2016-09-11 NOTE — Progress Notes (Signed)
[place] Work  Clinical Social Work was referred by Etheleen Sia, for assessment of psychosocial needs.  Clinical Social Worker contacted patient by phone to offer support and assess for needs. Ms. [name] is currently a patient at the cancer center and as previously treated elsewhere.  She shared she has had difficulty building rapport at the cancer center and made a connection with the navigator at [facility] today.  The patient reported she has been very overwhelmed and anxious after learning of metastatic disease and is seeking support from staff.  CSW acknowledged patient has built rapport with chaplain and CSW agreed to inform chaplain of today's conversation.  CSW will also inform Abigail [employer] of conversation today- CSWs will attempt to meet patient in person to introduce themselves.  Polo Riley, MSW, LCSW, OSW-C Clinical Social Worker [facility] [phone]

## 2016-09-14 MED FILL — traZODone HCL 150 MG TABS: 150 | 30 days supply | Qty: 60 | Fill #2

## 2016-09-14 MED FILL — POTASSIUM CL ER 10 MEQ TAB: 10 | 30 days supply | Qty: 30 | Fill #2

## 2016-09-15 ENCOUNTER — Other Ambulatory Visit (HOSPITAL_BASED_OUTPATIENT_CLINIC_OR_DEPARTMENT_OTHER): Payer: Medicaid Other

## 2016-09-15 ENCOUNTER — Ambulatory Visit (HOSPITAL_BASED_OUTPATIENT_CLINIC_OR_DEPARTMENT_OTHER): Payer: Medicaid Other

## 2016-09-15 ENCOUNTER — Encounter: Payer: Self-pay | Admitting: *Deleted

## 2016-09-15 VITALS — BP 124/81 | HR 81 | Temp 98.0°F | Resp 17

## 2016-09-15 DIAGNOSIS — Z5112 Encounter for antineoplastic immunotherapy: Secondary | ICD-10-CM | POA: Diagnosis present

## 2016-09-15 DIAGNOSIS — Z510 Encounter for antineoplastic radiation therapy: Secondary | ICD-10-CM | POA: Diagnosis not present

## 2016-09-15 DIAGNOSIS — C7951 Secondary malignant neoplasm of bone: Secondary | ICD-10-CM | POA: Diagnosis not present

## 2016-09-15 DIAGNOSIS — C773 Secondary and unspecified malignant neoplasm of axilla and upper limb lymph nodes: Secondary | ICD-10-CM

## 2016-09-15 DIAGNOSIS — C50312 Malignant neoplasm of lower-inner quadrant of left female breast: Secondary | ICD-10-CM

## 2016-09-15 LAB — CBC WITH DIFFERENTIAL/PLATELET
BASO%: 0 % (ref 0.0–2.0)
Basophils Absolute: 0 10*3/uL (ref 0.0–0.1)
EOS%: 0.1 % (ref 0.0–7.0)
Eosinophils Absolute: 0 10*3/uL (ref 0.0–0.5)
HCT: 39.1 % (ref 34.8–46.6)
HGB: 12.8 g/dL (ref 11.6–15.9)
LYMPH%: 7.2 % — ABNORMAL LOW (ref 14.0–49.7)
MCH: 32.2 pg (ref 25.1–34.0)
MCHC: 32.7 g/dL (ref 31.5–36.0)
MCV: 98.5 fL (ref 79.5–101.0)
MONO#: 0.3 10*3/uL (ref 0.1–0.9)
MONO%: 4.9 % (ref 0.0–14.0)
NEUT#: 6.1 10*3/uL (ref 1.5–6.5)
NEUT%: 87.8 % — ABNORMAL HIGH (ref 38.4–76.8)
Platelets: 174 10*3/uL (ref 145–400)
RBC: 3.97 10*6/uL (ref 3.70–5.45)
RDW: 15.2 % — ABNORMAL HIGH (ref 11.2–14.5)
WBC: 6.9 10*3/uL (ref 3.9–10.3)
lymph#: 0.5 10*3/uL — ABNORMAL LOW (ref 0.9–3.3)

## 2016-09-15 LAB — COMPREHENSIVE METABOLIC PANEL
ALT: 30 U/L (ref 0–55)
AST: 20 U/L (ref 5–34)
Albumin: 3.9 g/dL (ref 3.5–5.0)
Alkaline Phosphatase: 46 U/L (ref 40–150)
Anion Gap: 10 mEq/L (ref 3–11)
BUN: 17.9 mg/dL (ref 7.0–26.0)
CO2: 28 mEq/L (ref 22–29)
Calcium: 10.1 mg/dL (ref 8.4–10.4)
Chloride: 101 mEq/L (ref 98–109)
Creatinine: 1 mg/dL (ref 0.6–1.1)
EGFR: 63 mL/min/{1.73_m2} — ABNORMAL LOW (ref >90)
Glucose: 120 mg/dl (ref 70–140)
Potassium: 4.6 mEq/L (ref 3.5–5.1)
Sodium: 139 mEq/L (ref 136–145)
Total Bilirubin: 0.32 mg/dL (ref 0.20–1.20)
Total Protein: 6.5 g/dL (ref 6.4–8.3)

## 2016-09-15 MED ORDER — SODIUM CHLORIDE 0.9 % IV SOLN
Freq: Once | INTRAVENOUS | Status: AC
  Administered 2016-09-15: 12:00:00 via INTRAVENOUS

## 2016-09-15 MED ORDER — ACETAMINOPHEN 325 MG PO TABS
ORAL_TABLET | ORAL | Status: AC
  Filled 2016-09-15: qty 2

## 2016-09-15 MED ORDER — HEPARIN SOD (PORK) LOCK FLUSH 100 UNIT/ML IV SOLN
500.0000 [IU] | Freq: Once | INTRAVENOUS | Status: AC | PRN
Start: 2016-09-15 — End: 2016-09-15
  Administered 2016-09-15: 500 [IU]
  Filled 2016-09-15: qty 5

## 2016-09-15 MED ORDER — SODIUM CHLORIDE 0.9% FLUSH
10.0000 mL | INTRAVENOUS | Status: DC | PRN
  Administered 2016-09-15: 10 mL
  Filled 2016-09-15: qty 10

## 2016-09-15 MED ORDER — DIPHENHYDRAMINE HCL 25 MG PO CAPS: 25.0000 mg | ORAL_CAPSULE | Freq: Once | ORAL | Status: DC

## 2016-09-15 MED ORDER — TRASTUZUMAB CHEMO INJECTION 440 MG: 6.0000 mg/kg | Freq: Once | INTRAVENOUS | Status: DC

## 2016-09-15 MED ORDER — ACETAMINOPHEN 325 MG PO TABS
650.0000 mg | ORAL_TABLET | Freq: Once | ORAL | Status: AC
  Administered 2016-09-15: 650 mg via ORAL

## 2016-09-15 MED ORDER — TRASTUZUMAB CHEMO 150 MG IV SOLR
6.0000 mg/kg | Freq: Once | INTRAVENOUS | Status: AC
  Administered 2016-09-15: 420 mg via INTRAVENOUS
  Filled 2016-09-15: qty 20

## 2016-09-15 NOTE — Patient Instructions (Signed)
[facility] Discharge Instructions for Patients Receiving Chemotherapy  Today you received the following chemotherapy agents:  Herceptin  To help prevent nausea and vomiting after your treatment, we encourage you to take your nausea medication as prescribed.   If you develop nausea and vomiting that is not controlled by your nausea medication, call the clinic.   BELOW ARE SYMPTOMS THAT SHOULD BE REPORTED IMMEDIATELY:  *FEVER GREATER THAN 100.5 F  *CHILLS WITH OR WITHOUT FEVER  NAUSEA AND VOMITING THAT IS NOT CONTROLLED WITH YOUR NAUSEA MEDICATION  *UNUSUAL SHORTNESS OF BREATH  *UNUSUAL BRUISING OR BLEEDING  TENDERNESS IN MOUTH AND THROAT WITH OR WITHOUT PRESENCE OF ULCERS  *URINARY PROBLEMS  *BOWEL PROBLEMS  UNUSUAL RASH Items with * indicate a potential emergency and should be followed up as soon as possible.  Feel free to call the clinic you have any questions or concerns. The clinic phone number is [phone].  Please show the CHEMO ALERT CARD at check-in to the Emergency Department and triage nurse.   

## 2016-09-15 NOTE — Progress Notes (Signed)
[name]  [facility] representative met with patient in the infusion room to introduce and explain CSW role.  CSW provided information on the support team and services.  Patient plans to call and schedule an appointment with CSW to further explore support services and resources.  Johnnye Lana, MSW, LCSW, OSW-C Clinical Social Worker [facility] [phone]

## 2016-09-16 ENCOUNTER — Telehealth: Payer: Self-pay | Admitting: *Deleted

## 2016-09-16 LAB — LITHIUM LEVEL: Lithium: 0.6 mmol/L (ref 0.6–1.2)

## 2016-09-16 LAB — CANCER ANTIGEN 27.29: CA 27.29: 6.9 U/mL (ref 0.0–38.6)

## 2016-09-16 NOTE — Telephone Encounter (Signed)
Returned call to the patient, she will be seen tomorrow, couldn't give her any direction except to [facility] physicians near her home and try calling those offices to see if they would accept her as a Primary patient, she will trt that ,thanked Rn for returning her call

## 2016-09-16 NOTE — Telephone Encounter (Signed)
Enid Derry, do you know who is taking patients around this area now?

## 2016-09-16 NOTE — Telephone Encounter (Signed)
Patient called requesting a referral for a primary physician, stated the one she called ,wasn't taking any more patients she was told to call Dr. Dahlia Byes. Vertell Limber or Dr. Ida Rogue office for help 10:22 AM

## 2016-09-17 ENCOUNTER — Ambulatory Visit
Admission: RE | Admit: 2016-09-17 | Discharge: 2016-09-17 | Disposition: A | Discharge status: Home / Self Care | Payer: Medicaid Other | Source: Ambulatory Visit | Attending: Radiation Oncology | Admitting: Radiation Oncology

## 2016-09-17 DIAGNOSIS — C7931 Secondary malignant neoplasm of brain: Secondary | ICD-10-CM

## 2016-09-17 DIAGNOSIS — Z510 Encounter for antineoplastic radiation therapy: Secondary | ICD-10-CM | POA: Diagnosis not present

## 2016-09-17 MED ORDER — SONAFINE EX EMUL
1.0000 "application " | Freq: Two times a day (BID) | CUTANEOUS | Status: DC
  Administered 2016-09-17: 1 via TOPICAL

## 2016-09-17 NOTE — Progress Notes (Signed)
Patient education, sonafine cream, Radiation therapy and you book given to pateint, she has my business card already from last education to breast, discussed ways to manage side effects, n,v, skin irritation, hearing loss, hair loss, fatigue, to apply sonafine cream daily after rad tx once skin becomes irritated or itchy, increas protein and high calories ,drinks, patient asked about having diarrhea, gave printed information on this and gave low fiber diet  Information flyer also printed copy, but did emphasize that diarrhea  wasn't a side effect of radiation,patient gave verbal understanding,teach back given 9:58 AM

## 2016-09-18 ENCOUNTER — Encounter: Payer: Self-pay | Admitting: Radiation Oncology

## 2016-09-18 ENCOUNTER — Ambulatory Visit
Admission: RE | Admit: 2016-09-18 | Discharge: 2016-09-18 | Disposition: A | Discharge status: Home / Self Care | Payer: Medicaid Other | Source: Ambulatory Visit | Attending: Radiation Oncology | Admitting: Radiation Oncology

## 2016-09-18 DIAGNOSIS — Z510 Encounter for antineoplastic radiation therapy: Secondary | ICD-10-CM | POA: Diagnosis not present

## 2016-09-21 ENCOUNTER — Ambulatory Visit
Admission: RE | Admit: 2016-09-21 | Discharge: 2016-09-21 | Disposition: A | Discharge status: Home / Self Care | Payer: Medicaid Other | Source: Ambulatory Visit | Attending: Radiation Oncology | Admitting: Radiation Oncology

## 2016-09-21 DIAGNOSIS — Z510 Encounter for antineoplastic radiation therapy: Secondary | ICD-10-CM | POA: Diagnosis not present

## 2016-09-22 ENCOUNTER — Ambulatory Visit
Admission: RE | Admit: 2016-09-22 | Discharge: 2016-09-22 | Disposition: A | Discharge status: Home / Self Care | Payer: Medicaid Other | Source: Ambulatory Visit | Attending: Radiation Oncology | Admitting: Radiation Oncology

## 2016-09-22 DIAGNOSIS — Z510 Encounter for antineoplastic radiation therapy: Secondary | ICD-10-CM | POA: Diagnosis not present

## 2016-09-23 ENCOUNTER — Ambulatory Visit
Admission: RE | Admit: 2016-09-23 | Discharge: 2016-09-23 | Disposition: A | Discharge status: Home / Self Care | Payer: Medicaid Other | Source: Ambulatory Visit | Attending: Radiation Oncology | Admitting: Radiation Oncology

## 2016-09-23 ENCOUNTER — Encounter: Payer: Self-pay | Admitting: Radiation Oncology

## 2016-09-23 VITALS — BP 132/70 | HR 87 | Temp 97.8°F | Resp 16 | Wt 162.8 lb

## 2016-09-23 DIAGNOSIS — Z510 Encounter for antineoplastic radiation therapy: Secondary | ICD-10-CM | POA: Diagnosis not present

## 2016-09-23 DIAGNOSIS — C7931 Secondary malignant neoplasm of brain: Secondary | ICD-10-CM

## 2016-09-23 NOTE — Progress Notes (Signed)
   Weekly Management Note:  Outpatient    ICD-9-CM ICD-10-CM   1. Brain metastasis (HCC) 198.3 C79.31     Current Dose:  12.5 Gy  Projected Dose: 35 Gy   Narrative:  The patient presents for routine under treatment assessment.  CBCT/MVCT images/Port film x-rays were reviewed.  The chart was checked.Patient education done and reviewed the other day. Patient is not using Radiaplex yet. She notes she is on a tapered decadron taper. She is currently taking 1 mg daily. Patient is positive for fatigue, and slight anxiety. She notes a great appetite. She still smokes, though she is on Chantix. Patient denies skin irritation, blurred vision, dizziness, nausea, vision changes.  Physical Findings:  Wt Readings from Last 3 Encounters:  09/23/16 162 lb 12.8 oz (73.8 kg)  09/08/16 152 lb (68.9 kg)  08/25/16 152 lb 12.8 oz (69.3 kg)    weight is 162 lb 12.8 oz (73.8 kg). Her oral temperature is 97.8 F (36.6 C). Her blood pressure is 132/70 and her pulse is 87. Her respiration is 16 and oxygen saturation is 100%.  No oral thrush is noted. Ambulatory  CBC    Component Value Date/Time   WBC 6.9 09/15/2016 1125   WBC 5.0 08/13/2016 0926   RBC 3.97 09/15/2016 1125   RBC 4.21 08/13/2016 0926   HGB 12.8 09/15/2016 1125   HCT 39.1 09/15/2016 1125   PLT 174 09/15/2016 1125   MCV 98.5 09/15/2016 1125   MCH 32.2 09/15/2016 1125   MCH 30.6 08/13/2016 0926   MCHC 32.7 09/15/2016 1125   MCHC 31.6 08/13/2016 0926   RDW 15.2 (H) 09/15/2016 1125   LYMPHSABS 0.5 (L) 09/15/2016 1125   MONOABS 0.3 09/15/2016 1125   EOSABS 0.0 09/15/2016 1125   BASOSABS 0.0 09/15/2016 1125     CMP     Component Value Date/Time   NA 139 09/15/2016 1126   K 4.6 09/15/2016 1126   CL 107 08/13/2016 0926   CO2 28 09/15/2016 1126   GLUCOSE 120 09/15/2016 1126   BUN 17.9 09/15/2016 1126   CREATININE 1.0 09/15/2016 1126   CALCIUM 10.1 09/15/2016 1126   PROT 6.5 09/15/2016 1126   ALBUMIN 3.9 09/15/2016 1126   AST 20  09/15/2016 1126   ALT 30 09/15/2016 1126   ALKPHOS 46 09/15/2016 1126   BILITOT 0.32 09/15/2016 1126   GFRNONAA >60 08/13/2016 0926   GFRAA >60 08/13/2016 0926    Impression:  The patient is tolerating radiotherapy.   Plan:  Continue radiotherapy as planned.  Encouragement given today and possible side effects reviewed. -----------------------------------  Eppie Gibson, MD This document serves as a record of services personally performed by Eppie Gibson, MD. It was created on her behalf by Bethann Humble, a trained medical scribe. The creation of this record is based on the scribe's personal observations and the provider's statements to them. This document has been checked and approved by the attending provider.

## 2016-09-23 NOTE — Progress Notes (Signed)
Weekly radiation treatments  Whole  Brain,  5/14  completed, no skin irritation, no blurred vision, no dizzy ness, no nausea, no vision changes,   patient education done and reviewed the other day, not using radiaplex as yet On a tapered decadron taper, is taking 1mg  daily  Now, no thrush seen, some fatigue, some anxiety  Still great appetite, some fatigue,still smokes and on Chantix There were no vitals taken for this visit.  Wt Readings from Last 3 Encounters:  09/08/16 152 lb (68.9 kg)  08/25/16 152 lb 12.8 oz (69.3 kg)  08/14/16 154 lb (69.9 kg)   10:11 AM BP 132/70 (BP Location: Left Arm, Patient Position: Sitting, Cuff Size: Normal)   Pulse 87   Temp 97.8 F (36.6 C) (Oral)   Resp 16   Wt 162 lb 12.8 oz (73.8 kg)   SpO2 100% Comment: room air  BMI 27.09 kg/m

## 2016-09-24 ENCOUNTER — Ambulatory Visit
Admission: RE | Admit: 2016-09-24 | Discharge: 2016-09-24 | Disposition: A | Discharge status: Home / Self Care | Payer: Medicaid Other | Source: Ambulatory Visit | Attending: Radiation Oncology | Admitting: Radiation Oncology

## 2016-09-24 DIAGNOSIS — Z510 Encounter for antineoplastic radiation therapy: Secondary | ICD-10-CM | POA: Diagnosis not present

## 2016-09-25 ENCOUNTER — Ambulatory Visit
Admission: RE | Admit: 2016-09-25 | Discharge: 2016-09-25 | Disposition: A | Discharge status: Home / Self Care | Payer: Medicaid Other | Source: Ambulatory Visit | Attending: Radiation Oncology | Admitting: Radiation Oncology

## 2016-09-25 ENCOUNTER — Encounter: Payer: Self-pay | Admitting: Radiation Oncology

## 2016-09-25 VITALS — BP 132/81 | HR 100 | Temp 97.9°F | Resp 16 | Wt 162.4 lb

## 2016-09-25 DIAGNOSIS — C7931 Secondary malignant neoplasm of brain: Secondary | ICD-10-CM

## 2016-09-25 DIAGNOSIS — Z510 Encounter for antineoplastic radiation therapy: Secondary | ICD-10-CM | POA: Diagnosis not present

## 2016-09-25 MED FILL — CHANTIX 0.5 MG TABLET: 0.5 | 22 days supply | Qty: 22 | Fill #1

## 2016-09-25 MED FILL — PANTOPRAZOLE SOD DR 40 MG T: 40 | 30 days supply | Qty: 30 | Fill #1

## 2016-09-25 NOTE — Progress Notes (Addendum)
Weekly rad tx whole brain 7/14 completed, patient tearful, fatigued,talking with Bary Castilla RN,  at present,  hasving headaches again  4/10 scale . Takes ibuprofen , then within 30 min-1 hour takes tramadol, constant dull top of temporal and occipital, has had twitching right eye, taking decadron only at night, very anxious,  Depressed, has sonafine, not using on scalp , skin intact 10:14 AM BP 132/81 (BP Location: Right Arm, Patient Position: Sitting, Cuff Size: Normal)   Pulse 100   Temp 97.9 F (36.6 C) (Oral)   Resp 16   Wt 162 lb 6.4 oz (73.7 kg)   SpO2 97% Comment: room air  BMI 27.02 kg/m   Wt Readings from Last 3 Encounters:  09/25/16 162 lb 6.4 oz (73.7 kg)  09/23/16 162 lb 12.8 oz (73.8 kg)  09/08/16 152 lb (68.9 kg)

## 2016-09-25 NOTE — Progress Notes (Signed)
Department of Radiation Oncology  Phone:  [phone] Fax:        [phone]  Weekly Treatment Note    Name: [name] Date: 09/25/2016 MRN: [id number] DOB: Oct 16, 1961   Diagnosis:     ICD-9-CM ICD-10-CM   1. Brain metastasis ([place]) 198.3 C79.31      Current dose: 17.5 Gy  Current fraction:7   MEDICATIONS: Current Outpatient Prescriptions  Medication Sig Dispense Refill  . ALPRAZolam (XANAX) 1 MG tablet Take 1 mg by mouth as directed. Take xanax 1mg  tab oral 30 minutes before radaition treatments and every hour sleep,dispense 60 tabs no refill    . anastrozole (ARIMIDEX) 1 MG tablet Take 1 tablet (1 mg total) by mouth daily. 90 tablet 1  . BIOTIN PO Take 1 capsule by mouth daily.    . cetirizine (ZYRTEC) 10 MG tablet Take 1 tablet (10 mg total) by mouth daily. 30 tablet 11  . dexamethasone (DECADRON) 4 MG tablet Take 0.5 tablets (2 mg total) by mouth 2 (two) times daily. 60 tablet 0  . diphenoxylate-atropine (LOMOTIL) 2.5-0.025 MG tablet Take 2 tablets by mouth 4 (four) times daily as needed for diarrhea or loose stools. 40 tablet 0  . fluticasone (FLONASE) 50 MCG/ACT nasal spray Place 1 spray into the nose daily.    [name] gabapentin (NEURONTIN) 300 MG capsule Take 2 capsules (600 mg total) by mouth 3 (three) times daily. 300 mg at night for one day, twice daily for one day, then three times daily 180 capsule 2  . HYDROcodone-acetaminophen (NORCO) 7.5-325 MG tablet Take 1-2 tablets by mouth every 6 (six) hours as needed for severe pain (give one tab, then if other tab is needed, may give 30 min later.). 60 tablet 0  . ibuprofen (ADVIL,MOTRIN) 200 MG tablet Take 800 mg by mouth every 6 (six) hours as needed for mild pain.    [name] lamoTRIgine (LAMICTAL) 100 MG tablet Take 1.5 tablets (150 mg total) by mouth 2 (two) times daily. 60 tablet 3  . lidocaine-prilocaine (EMLA) cream Apply 1 application topically as needed. 30 g 0  . lithium carbonate 300 MG capsule Take 1 capsule (300  mg total) by mouth 2 (two) times daily with a meal. 60 capsule 3  . Loperamide HCl (IMODIUM PO) Take 1 tablet by mouth as needed (diarrhea).     . LORazepam (ATIVAN) 0.5 MG tablet Take 1 tablet (0.5 mg total) by mouth every 8 (eight) hours as needed for anxiety. 30 tablet 1  . ondansetron (ZOFRAN) 8 MG tablet Take 1 tablet (8 mg total) by mouth every 8 (eight) hours as needed for nausea or vomiting. 30 tablet 1  . pantoprazole (PROTONIX) 40 MG tablet Take 1 tablet (40 mg total) by mouth daily. 30 tablet 3  . potassium chloride (K-DUR) 10 MEQ tablet Take 1 tablet (10 mEq total) by mouth daily. 30 tablet 3  . tiZANidine (ZANAFLEX) 4 MG tablet Take 1 tablet (4 mg total) by mouth daily. 30 tablet 0  . traMADol (ULTRAM) 50 MG tablet Take 1 tablet (50 mg total) by mouth every 6 (six) hours as needed. 60 tablet 0  . trazodone (DESYREL) 300 MG tablet Take 1 tablet (300 mg total) by mouth at bedtime. 30 tablet 3  . varenicline (CHANTIX) 0.5 MG tablet Take 1 tablet (0.5 mg total) by mouth daily. 30 tablet 3  . Vitamin D, Cholecalciferol, 1000 units CAPS Take 1,000 Units by mouth daily.    . Wound Dressings (SONAFINE) Apply 1 application  topically daily. Apply to head after rad txs whern skin becomes itchy or irritated,     No current facility-administered medications for this encounter.      ALLERGIES: Demerol and Erythromycin   LABORATORY DATA:  Lab Results  Component Value Date   WBC 6.9 09/15/2016   HGB 12.8 09/15/2016   HCT 39.1 09/15/2016   MCV 98.5 09/15/2016   PLT 174 09/15/2016   Lab Results  Component Value Date   NA 139 09/15/2016   K 4.6 09/15/2016   CL 107 08/13/2016   CO2 28 09/15/2016   Lab Results  Component Value Date   ALT 30 09/15/2016   AST 20 09/15/2016   ALKPHOS 46 09/15/2016   BILITOT 0.32 09/15/2016     NARRATIVE: [name] was seen today for weekly treatment management. The chart was checked and the patient's films were reviewed.  Weekly rad tx whole  brain. Patient tearful, fatigued, talking with Bary Castilla RN at present. Having headaches again as a 4/10. Takes ibuprofen, then within 30 min - 1 hour takes tramadol, constant dull top of temporal and occipital, has had twitching of the right eye. Taking decadron only at night. Very anxious, depressed. Has sonafine, not using on scalp, skin intact. She reports numb fingers and them feeling cold. On Decadron taper (2 mg at night). The patient is scheduled to taper further tomorrow.  PHYSICAL EXAMINATION: weight is 162 lb 6.4 oz (73.7 kg). Her oral temperature is 97.9 F (36.6 C). Her blood pressure is 132/81 and her pulse is 100. Her respiration is 16 and oxygen saturation is 97%.   Alert and oriented. Teary.  ASSESSMENT: The patient is doing satisfactorily with treatment. I advised the patient to maintain her current dose of decadron of 2 mg once a day and be on a decongestant for her headaches.  PLAN: We will continue with the patient's radiation treatment as planned.  ------------------------------------------------  Jodelle Gross, MD, PhD  This document serves as a record of services personally performed by Kyung Rudd, MD. It was created on his behalf by Darcus Austin, a trained medical scribe. The creation of this record is based on the scribe's personal observations and the provider's statements to them. This document has been checked and approved by the attending provider.

## 2016-09-28 ENCOUNTER — Ambulatory Visit
Admission: RE | Admit: 2016-09-28 | Discharge: 2016-09-28 | Disposition: A | Discharge status: Home / Self Care | Payer: Medicaid Other | Source: Ambulatory Visit | Attending: Radiation Oncology | Admitting: Radiation Oncology

## 2016-09-28 DIAGNOSIS — Z510 Encounter for antineoplastic radiation therapy: Secondary | ICD-10-CM | POA: Diagnosis not present

## 2016-09-29 ENCOUNTER — Other Ambulatory Visit: Payer: Self-pay | Admitting: Oncology

## 2016-09-29 ENCOUNTER — Ambulatory Visit: Payer: Medicaid Other

## 2016-09-29 ENCOUNTER — Other Ambulatory Visit: Payer: Self-pay

## 2016-09-29 MED ORDER — LAMOTRIGINE 100 MG PO TABS: 150.0000 mg | ORAL_TABLET | Freq: Two times a day (BID) | ORAL | 1 refills | Status: DC

## 2016-09-29 MED FILL — lamoTRIgine 100 MG TABS: 100 | 20 days supply | Qty: 60 | Fill #0

## 2016-09-29 NOTE — Telephone Encounter (Signed)
Refill request for lamictal sent to pharmacy.  Inbasket sent to [facility] re: need for Tramadol refill and tenderness on sides of her head on waking.

## 2016-09-30 ENCOUNTER — Telehealth: Payer: Self-pay | Admitting: *Deleted

## 2016-09-30 ENCOUNTER — Ambulatory Visit
Admission: RE | Admit: 2016-09-30 | Discharge: 2016-09-30 | Disposition: A | Discharge status: Home / Self Care | Payer: Medicaid Other | Source: Ambulatory Visit | Attending: Radiation Oncology | Admitting: Radiation Oncology

## 2016-09-30 DIAGNOSIS — Z510 Encounter for antineoplastic radiation therapy: Secondary | ICD-10-CM | POA: Diagnosis not present

## 2016-09-30 MED FILL — traMADol HCL 50 MG TABS: 50 | 15 days supply | Qty: 60 | Fill #0

## 2016-09-30 NOTE — Telephone Encounter (Signed)
Attempted to call ms Wenzlick 2x both times she couldn't hear me and hung up, so I called [facility] to refill her tramadol per Alean Rinne, RX: 50 mg tablet, take 1 tramadol  Every 6 hours as needed for pain, dispense 60 tabs no refill, left voice message   12:14 PM

## 2016-10-01 ENCOUNTER — Ambulatory Visit
Admission: RE | Admit: 2016-10-01 | Discharge: 2016-10-01 | Disposition: A | Discharge status: Home / Self Care | Payer: Medicaid Other | Source: Ambulatory Visit | Attending: Radiation Oncology | Admitting: Radiation Oncology

## 2016-10-01 DIAGNOSIS — Z510 Encounter for antineoplastic radiation therapy: Secondary | ICD-10-CM | POA: Diagnosis not present

## 2016-10-02 ENCOUNTER — Telehealth: Payer: Self-pay | Admitting: *Deleted

## 2016-10-02 ENCOUNTER — Encounter (HOSPITAL_COMMUNITY): Payer: Self-pay | Admitting: Vascular Surgery

## 2016-10-02 ENCOUNTER — Ambulatory Visit
Admission: RE | Admit: 2016-10-02 | Discharge: 2016-10-02 | Disposition: A | Discharge status: Home / Self Care | Payer: Medicaid Other | Source: Ambulatory Visit | Attending: Radiation Oncology | Admitting: Radiation Oncology

## 2016-10-02 ENCOUNTER — Encounter: Payer: Self-pay | Admitting: Radiation Oncology

## 2016-10-02 ENCOUNTER — Telehealth (HOSPITAL_COMMUNITY): Payer: Self-pay | Admitting: Vascular Surgery

## 2016-10-02 VITALS — BP 121/78 | HR 80 | Temp 98.3°F | Resp 18 | Ht 65.0 in | Wt 159.0 lb

## 2016-10-02 DIAGNOSIS — C7931 Secondary malignant neoplasm of brain: Secondary | ICD-10-CM

## 2016-10-02 DIAGNOSIS — Z510 Encounter for antineoplastic radiation therapy: Secondary | ICD-10-CM | POA: Diagnosis not present

## 2016-10-02 MED ORDER — DIPHENOXYLATE-ATROPINE 2.5-0.025 MG PO TABS: 2.0000 | ORAL_TABLET | Freq: Four times a day (QID) | ORAL | 0 refills | Status: DC | PRN

## 2016-10-02 MED FILL — DIPHENOXYLATE/ATROPINE TAB: 2.5-0.025 | 5 days supply | Qty: 40 | Fill #0

## 2016-10-02 NOTE — Telephone Encounter (Signed)
Patient left without getting rx for lomotil, RX: take 2 tablets oral q 4hours prn for diarrhea or loose tools, dispense 40 tabs, no refill, spoke with [facility] 10:36 AM

## 2016-10-02 NOTE — Progress Notes (Signed)
Department of Radiation Oncology  Phone:  [phone] Fax:        [phone]  Weekly Treatment Note    Name: [name] Date: 10/02/2016 MRN: [id number] DOB: May 07, 1961   Diagnosis:     ICD-9-CM ICD-10-CM   1. Brain metastasis ([facility]) 198.3 C79.31      Current dose: 27.5 Gy  Current fraction: 11   MEDICATIONS: Current Outpatient Prescriptions  Medication Sig Dispense Refill  . ALPRAZolam (XANAX) 1 MG tablet Take 1 mg by mouth as directed. Take xanax 1mg  tab oral 30 minutes before radaition treatments and every hour sleep,dispense 60 tabs no refill    . anastrozole (ARIMIDEX) 1 MG tablet Take 1 tablet (1 mg total) by mouth daily. 90 tablet 1  . BIOTIN PO Take 1 capsule by mouth daily.    . cetirizine (ZYRTEC) 10 MG tablet Take 1 tablet (10 mg total) by mouth daily. 30 tablet 11  . dexamethasone (DECADRON) 4 MG tablet Take 0.5 tablets (2 mg total) by mouth 2 (two) times daily. 60 tablet 0  . diphenoxylate-atropine (LOMOTIL) 2.5-0.025 MG tablet Take 2 tablets by mouth 4 (four) times daily as needed for diarrhea or loose stools. 40 tablet 0  . fluticasone (FLONASE) 50 MCG/ACT nasal spray Place 1 spray into the nose daily.    [name] gabapentin (NEURONTIN) 300 MG capsule Take 2 capsules (600 mg total) by mouth 3 (three) times daily. 300 mg at night for one day, twice daily for one day, then three times daily 180 capsule 2  . ibuprofen (ADVIL,MOTRIN) 200 MG tablet Take 800 mg by mouth every 6 (six) hours as needed for mild pain.    [name] lamoTRIgine (LAMICTAL) 100 MG tablet Take 1.5 tablets (150 mg total) by mouth 2 (two) times daily. 60 tablet 1  . lithium carbonate 300 MG capsule Take 1 capsule (300 mg total) by mouth 2 (two) times daily with a meal. 60 capsule 3  . MULTIPLE VITAMIN PO Take by mouth every morning.    . ondansetron (ZOFRAN) 8 MG tablet Take 1 tablet (8 mg total) by mouth every 8 (eight) hours as needed for nausea or vomiting. 30 tablet 1  . pantoprazole (PROTONIX) 40 MG  tablet Take 1 tablet (40 mg total) by mouth daily. 30 tablet 3  . potassium chloride (K-DUR) 10 MEQ tablet Take 1 tablet (10 mEq total) by mouth daily. 30 tablet 3  . traMADol (ULTRAM) 50 MG tablet Take 1 tablet (50 mg total) by mouth every 6 (six) hours as needed. 60 tablet 0  . trazodone (DESYREL) 300 MG tablet Take 1 tablet (300 mg total) by mouth at bedtime. 30 tablet 3  . varenicline (CHANTIX) 0.5 MG tablet Take 1 tablet (0.5 mg total) by mouth daily. 30 tablet 3  . Vitamin D, Cholecalciferol, 1000 units CAPS Take 1,000 Units by mouth daily.    . Wound Dressings (SONAFINE) Apply 1 application topically daily. Apply to head after rad txs whern skin becomes itchy or irritated,    . HYDROcodone-acetaminophen (NORCO) 7.5-325 MG tablet Take 1-2 tablets by mouth every 6 (six) hours as needed for severe pain (give one tab, then if other tab is needed, may give 30 min later.). (Patient not taking: Reported on 10/02/2016) 60 tablet 0  . lidocaine-prilocaine (EMLA) cream Apply 1 application topically as needed. (Patient not taking: Reported on 10/02/2016) 30 g 0  . Loperamide HCl (IMODIUM PO) Take 1 tablet by mouth as needed (diarrhea).     . LORazepam (ATIVAN)  0.5 MG tablet Take 1 tablet (0.5 mg total) by mouth every 8 (eight) hours as needed for anxiety. (Patient not taking: Reported on 10/02/2016) 30 tablet 1  . tiZANidine (ZANAFLEX) 4 MG tablet Take 1 tablet (4 mg total) by mouth daily. (Patient not taking: Reported on 10/02/2016) 30 tablet 0   No current facility-administered medications for this encounter.      ALLERGIES: Demerol and Erythromycin   LABORATORY DATA:  Lab Results  Component Value Date   WBC 6.9 09/15/2016   HGB 12.8 09/15/2016   HCT 39.1 09/15/2016   MCV 98.5 09/15/2016   PLT 174 09/15/2016   Lab Results  Component Value Date   NA 139 09/15/2016   K 4.6 09/15/2016   CL 107 08/13/2016   CO2 28 09/15/2016   Lab Results  Component Value Date   ALT 30 09/15/2016   AST  20 09/15/2016   ALKPHOS 46 09/15/2016   BILITOT 0.32 09/15/2016     NARRATIVE: [name] was seen today for weekly treatment management. The chart was checked and the patient's films were reviewed.  Weekly rad tx whole brain 11/14 completed. Fatigued. Short term memory loss about having conversations with other people. Headaches as a 6/10. Takes ibuprofen and then takes Tramadol 30 min-1 hour later. Constant dull pain located top of temporal and occipital. Twitching right eye. Using sonafine on the edge of her scalp and ears. Forehead and ears with erythema. Skin intact. Reports uncontrollable diarrhea. Has been taking Lomotil and is about to run out. Taking decadron only in the AM (2 mg). Very anxious and depressed. The patient has started to notice she is losing her hair.  PHYSICAL EXAMINATION: height is 5\' 5"  (1.651 m) and weight is 159 lb (72.1 kg). Her oral temperature is 98.3 F (36.8 C). Her blood pressure is 121/78 and her pulse is 80. Her respiration is 18 and oxygen saturation is 100%.    Oropharynx clear with no thrush. Some emerging alopecia and some erythema in the forehead.  ASSESSMENT: The patient is doing satisfactorily with treatment.  PLAN: We will continue with the patient's radiation treatment as planned. She is to continue her current dose of Decadron (2mg  once a day). I will refill her Lomotil.  ------------------------------------------------  Jodelle Gross, MD, PhD  This document serves as a record of services personally performed by Kyung Rudd, MD. It was created on his behalf by Darcus Austin, a trained medical scribe. The creation of this record is based on the scribe's personal observations and the provider's statements to them. This document has been checked and approved by the attending provider.

## 2016-10-02 NOTE — Progress Notes (Signed)
Weekly rad tx whole brain 11/14 completed, patient, fatigued,short term memory loss with other people during her conservation,  hasving headaches again  6/10 scale . Takes ibuprofen , then within 30 min-1 hour takes tramadol, constant dull top of temporal and occipital, has had twitching right eye, taking decadron only in the am, very anxious,  Depressed, has sonafine,  using on edge of scalp and on her ears , forehead and ears with redness  skin intact. Wt Readings from Last 3 Encounters:  10/02/16 159 lb (72.1 kg)  09/25/16 162 lb 6.4 oz (73.7 kg)  09/23/16 162 lb 12.8 oz (73.8 kg)  BP 121/78   Pulse 80   Temp 98.3 F (36.8 C) (Oral)   Resp 18   Ht 5\' 5"  (1.651 m)   Wt 159 lb (72.1 kg)   SpO2 100%   BMI 26.46 kg/m

## 2016-10-02 NOTE — Telephone Encounter (Signed)
Left pt message to make f/u appt w. Echo in Dec

## 2016-10-02 NOTE — Addendum Note (Signed)
Encounter addended by: Doreen Beam, RN on: 10/02/2016 10:36 AM<BR>    Actions taken: Home Medications modified, Order Reconciliation Section accessed

## 2016-10-05 ENCOUNTER — Ambulatory Visit
Admission: RE | Admit: 2016-10-05 | Discharge: 2016-10-05 | Disposition: A | Discharge status: Home / Self Care | Payer: Medicaid Other | Source: Ambulatory Visit | Attending: Radiation Oncology | Admitting: Radiation Oncology

## 2016-10-05 DIAGNOSIS — Z510 Encounter for antineoplastic radiation therapy: Secondary | ICD-10-CM | POA: Diagnosis not present

## 2016-10-06 ENCOUNTER — Ambulatory Visit
Admission: RE | Admit: 2016-10-06 | Discharge: 2016-10-06 | Disposition: A | Discharge status: Home / Self Care | Payer: Medicaid Other | Source: Ambulatory Visit | Attending: Radiation Oncology | Admitting: Radiation Oncology

## 2016-10-06 ENCOUNTER — Ambulatory Visit (HOSPITAL_BASED_OUTPATIENT_CLINIC_OR_DEPARTMENT_OTHER): Payer: Medicaid Other | Admitting: Oncology

## 2016-10-06 ENCOUNTER — Other Ambulatory Visit (HOSPITAL_BASED_OUTPATIENT_CLINIC_OR_DEPARTMENT_OTHER): Payer: Medicaid Other

## 2016-10-06 ENCOUNTER — Ambulatory Visit (HOSPITAL_BASED_OUTPATIENT_CLINIC_OR_DEPARTMENT_OTHER): Payer: Medicaid Other

## 2016-10-06 ENCOUNTER — Encounter: Payer: Self-pay | Admitting: *Deleted

## 2016-10-06 VITALS — BP 127/69 | HR 80 | Temp 98.1°F | Resp 18 | Wt 161.2 lb

## 2016-10-06 DIAGNOSIS — F319 Bipolar disorder, unspecified: Secondary | ICD-10-CM

## 2016-10-06 DIAGNOSIS — C7951 Secondary malignant neoplasm of bone: Secondary | ICD-10-CM

## 2016-10-06 DIAGNOSIS — C773 Secondary and unspecified malignant neoplasm of axilla and upper limb lymph nodes: Secondary | ICD-10-CM | POA: Diagnosis not present

## 2016-10-06 DIAGNOSIS — C7931 Secondary malignant neoplasm of brain: Secondary | ICD-10-CM

## 2016-10-06 DIAGNOSIS — C50312 Malignant neoplasm of lower-inner quadrant of left female breast: Secondary | ICD-10-CM | POA: Diagnosis present

## 2016-10-06 DIAGNOSIS — Z170 Estrogen receptor positive status [ER+]: Secondary | ICD-10-CM | POA: Diagnosis not present

## 2016-10-06 DIAGNOSIS — F411 Generalized anxiety disorder: Secondary | ICD-10-CM | POA: Diagnosis not present

## 2016-10-06 DIAGNOSIS — R197 Diarrhea, unspecified: Secondary | ICD-10-CM | POA: Diagnosis not present

## 2016-10-06 DIAGNOSIS — Z5112 Encounter for antineoplastic immunotherapy: Secondary | ICD-10-CM

## 2016-10-06 DIAGNOSIS — Z510 Encounter for antineoplastic radiation therapy: Secondary | ICD-10-CM | POA: Diagnosis not present

## 2016-10-06 LAB — CBC WITH DIFFERENTIAL/PLATELET
BASO%: 0.3 % (ref 0.0–2.0)
Basophils Absolute: 0 10*3/uL (ref 0.0–0.1)
EOS%: 0.7 % (ref 0.0–7.0)
Eosinophils Absolute: 0.1 10*3/uL (ref 0.0–0.5)
HCT: 41.3 % (ref 34.8–46.6)
HGB: 13.5 g/dL (ref 11.6–15.9)
LYMPH%: 14.5 % (ref 14.0–49.7)
MCH: 32.9 pg (ref 25.1–34.0)
MCHC: 32.7 g/dL (ref 31.5–36.0)
MCV: 100.7 fL (ref 79.5–101.0)
MONO#: 0.4 10*3/uL (ref 0.1–0.9)
MONO%: 6.6 % (ref 0.0–14.0)
NEUT#: 5.2 10*3/uL (ref 1.5–6.5)
NEUT%: 77.9 % — ABNORMAL HIGH (ref 38.4–76.8)
Platelets: 187 10*3/uL (ref 145–400)
RBC: 4.1 10*6/uL (ref 3.70–5.45)
RDW: 14 % (ref 11.2–14.5)
WBC: 6.7 10*3/uL (ref 3.9–10.3)
lymph#: 1 10*3/uL (ref 0.9–3.3)

## 2016-10-06 LAB — COMPREHENSIVE METABOLIC PANEL
ALT: 19 U/L (ref 0–55)
AST: 16 U/L (ref 5–34)
Albumin: 3.7 g/dL (ref 3.5–5.0)
Alkaline Phosphatase: 44 U/L (ref 40–150)
Anion Gap: 9 mEq/L (ref 3–11)
BUN: 13.6 mg/dL (ref 7.0–26.0)
CO2: 27 mEq/L (ref 22–29)
Calcium: 9.5 mg/dL (ref 8.4–10.4)
Chloride: 105 mEq/L (ref 98–109)
Creatinine: 0.9 mg/dL (ref 0.6–1.1)
EGFR: 69 mL/min/{1.73_m2} — ABNORMAL LOW (ref >90)
Glucose: 107 mg/dl (ref 70–140)
Potassium: 3.7 mEq/L (ref 3.5–5.1)
Sodium: 141 mEq/L (ref 136–145)
Total Bilirubin: 0.3 mg/dL (ref 0.20–1.20)
Total Protein: 6.4 g/dL (ref 6.4–8.3)

## 2016-10-06 MED ORDER — TRASTUZUMAB CHEMO 150 MG IV SOLR
6.0000 mg/kg | Freq: Once | INTRAVENOUS | Status: AC
  Administered 2016-10-06: 420 mg via INTRAVENOUS
  Filled 2016-10-06: qty 20

## 2016-10-06 MED ORDER — ACETAMINOPHEN 325 MG PO TABS: 650.0000 mg | ORAL_TABLET | Freq: Once | ORAL | Status: DC

## 2016-10-06 MED ORDER — DIPHENHYDRAMINE HCL 25 MG PO CAPS: 25.0000 mg | ORAL_CAPSULE | Freq: Once | ORAL | Status: DC

## 2016-10-06 MED ORDER — SODIUM CHLORIDE 0.9% FLUSH
10.0000 mL | INTRAVENOUS | Status: DC | PRN
  Administered 2016-10-06: 10 mL
  Filled 2016-10-06: qty 10

## 2016-10-06 MED ORDER — HEPARIN SOD (PORK) LOCK FLUSH 100 UNIT/ML IV SOLN
500.0000 [IU] | Freq: Once | INTRAVENOUS | Status: AC | PRN
  Administered 2016-10-06: 500 [IU]
  Filled 2016-10-06: qty 5

## 2016-10-06 MED ORDER — DENOSUMAB 120 MG/1.7ML ~~LOC~~ SOLN
120.0000 mg | Freq: Once | SUBCUTANEOUS | Status: AC
  Administered 2016-10-06: 120 mg via SUBCUTANEOUS
  Filled 2016-10-06: qty 1.7

## 2016-10-06 MED ORDER — SODIUM CHLORIDE 0.9 % IV SOLN
Freq: Once | INTRAVENOUS | Status: AC
  Administered 2016-10-06: 11:00:00 via INTRAVENOUS

## 2016-10-06 NOTE — Patient Instructions (Signed)
[facility] Discharge Instructions for Patients Receiving Chemotherapy  Today you received the following treatment;  Herceptin.   BELOW ARE SYMPTOMS THAT SHOULD BE REPORTED IMMEDIATELY:  *FEVER GREATER THAN 100.5 F  *CHILLS WITH OR WITHOUT FEVER  NAUSEA AND VOMITING THAT IS NOT CONTROLLED WITH YOUR NAUSEA MEDICATION  *UNUSUAL SHORTNESS OF BREATH  *UNUSUAL BRUISING OR BLEEDING  TENDERNESS IN MOUTH AND THROAT WITH OR WITHOUT PRESENCE OF ULCERS  *URINARY PROBLEMS  *BOWEL PROBLEMS  UNUSUAL RASH Items with * indicate a potential emergency and should be followed up as soon as possible.  Feel free to call the clinic you have any questions or concerns. The clinic phone number is [phone].  Please show the [facility] at check-in to the Emergency Department and triage nurse.

## 2016-10-06 NOTE — Progress Notes (Signed)
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 She continues to receive whole brain irradiation, with her last dose due for tomorrow she tells me that her scalp hurts. She takes ibuprofen and tramadol for that. Sometimes the pain wakes her up at night. She is losing her hair. She usually uses and toboggan's to cover that up. 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 Usually by the medical the afternoon she is pretty tired and she goes straight to pajamas sent to bed. She denies unusual headaches, visual changes, nausea, or vomiting  REVIEW OF SYSTEMS: [name] has a bit of her runny nose. She had diarrhea last week which she attributes to problems with food. That has resolved. She has not yet met with her counselor, but has made the arrangements to see her middle of next month. She has pain in her back and some joints as well, feels short of breath sometimes, and is very anxious and depressed. She is very scared. "I don't want to die". A detailed review of systems today was otherwise stable  BREAST CANCER HISTORY: From the original intake note:  [name] was aware of a "lemon sized lump in" her left axilla for about a year before bringing it to medical attention. By then she had developed left breast and left axillary swelling (June 2016). She presented to the local emergency room and had  a chest CT scan 06/06/2015 which showed a nodule in the left breast measuring 0.9 cm and questionable left axillary adenopathy. She then proceeded to bilateral diagnostic mammography and left breast ultrasonography 06/19/2015. There were no prior films for comparison (last mammography 12 years prior).. The breast density was category C. Mammography showed in the left breast upper inner quadrant a 7 cm area including a small mass and significant pleomorphic calcifications. Ultrasonography defined the mass as measuring 1.2 cm. The left axilla appeared unremarkable. There was significant skin edema.  Biopsy of the left breast mass 06/19/2015 showed (SP [id number]) an invasive ductal carcinoma, grade 2, estrogen receptor 83% positive, progesterone receptor 26% positive, and HER-2 amplified by immunohistochemstry with a 3+ reading. The patient had biopsies of a separate area in the left breast August of the same year and this showed atypical ductal hyperplasia. (SP [id number]).  Accordingly after appropriate discussion on 08/21/2015 the patient proceeded to left mastectomy with left axillary sentinel lymph node sampling, which, since the lymph nodes were positive, extended to the procedure to left axillary lymph node dissection. The pathology (SP [id number]) showed an invasive ductal carcinoma, grade 3, measuring in excess of 9 cm. There were also skin satellites, not contiguous with the invasive carcinoma. Margins were clear and ample. There was evidence of lymphovascular invasion. A total of 15 lymph nodes were removed, including 5 sentinel lymph nodes, all of which were positive, so that the final total was 14 out of 15 lymph nodes involved by tumor. There was evidence of extranodal extension. The final pathology was pT4b  pN2a, stage IIIB  CA-27-29 and CEA 09/19/2015 were non-informative October 2016.  Unfortunately CT scans of the chest abdomen and pelvis 09/16/2015 showed bony metastases to the right scapula, left  iliac crest, and also L4 and T-spine. There were questionable liver cysts which on repeat CT scan 03/02/2016 appear to be a little bit more well-defined, possibly a little larger. There were also some possible right upper lobe lung lesions.  Adjuvant treatment consisted of docetaxel, trastuzumab and pertuzumab, with the final (6th) docetaxel dose given 02/11/2016. She continues on trastuzumab and pertuzumab, with the 11th cycle given 05/05/2016. Echocardiogram 02/26/2016 showed an ejection fraction of 55%. She receives denosumab/Xgeva every 6 weeks.. She also receives radiation, started 06/09/201, to be completed 06/26/2016.  Her subsequent history is as detailed below   PAST MEDICAL HISTORY: Past Medical History:  Diagnosis Date  . Alcohol abuse   . Anemia    during chemo  . Anxiety    At age 47  . Arthritis Dx 2010  . Bipolar disorder ([place])   . Cancer (HCC)    breast  . Chronic pain   . Depression   . Fibromyalgia Dx 2005  . GERD (gastroesophageal reflux disease)   . Headache    hx  migraines  . Opiate dependence ([place])   . Port-a-cath in place   . PTSD (post-traumatic stress disorder)     PAST SURGICAL HISTORY: Past Surgical History:  Procedure Laterality Date  . APPLICATION OF CRANIAL NAVIGATION N/A 08/14/2016   Procedure: APPLICATION OF CRANIAL NAVIGATION;  Surgeon: Erline Levine, MD;  Location: [facility];  Service: Neurosurgery;  Laterality: N/A;  . BREAST RECONSTRUCTION Left    with silicone implant  . CRANIOTOMY N/A 08/14/2016   Procedure: CRANIOTOMY TUMOR EXCISION WITH Lucky Rathke;  Surgeon: Erline Levine, MD;  Location: [facility];  Service: Neurosurgery;  Laterality: N/A;  . FIBULA FRACTURE SURGERY    . MASTECTOMY Left   . RADIOLOGY WITH ANESTHESIA N/A 07/23/2016   Procedure: MRI OF BRAIN WITH AND WITHOUT;  Surgeon: Medication Radiologist, MD;  Location: [facility];  Service: Radiology;  Laterality: N/A;  . right power port placement Right     FAMILY HISTORY Family  History  Problem Relation Age of Onset  . Diabetes Mother   . Bipolar disorder Mother   . CAD Father   The patient's father still living, age 12, in [place]. He had prostate cancer at some point in the past. The patient's mother died at age 13 from complications of diabetes. The patient had no brothers, 2 sisters. A paternal grandmother had lung cancer in the setting of tobacco abuse. There is no other history of cancer in the family to her knowledge  GYNECOLOGIC HISTORY:  No LMP recorded. Patient has had an ablation. Menarche approximately age 44. First live birth in 30. The patient is GX P2. She underwent endometrial ablation in 2016.  SOCIAL HISTORY:  The patient is originally from [place]. She has lived in [place] before but more recently was in [place]. She is back here because she could not afford her rent in [place]. She is living here and a temporary situation. She has divorced. HER-2 children are [name] who lives in [place] and works as a Development worker, community, and [name] who also lives in [place] and works as a Catering manager. The patient has a grandchild, 58 years old as of July 2017, living in [place] with his mother. The patient has not established herself with a local church yet.    ADVANCED DIRECTIVES:  Not in place; at the 06/03/2016 visit the patient was given the appropriate forms to complete and notarize at her discretion   HEALTH MAINTENANCE: Social History  Substance Use Topics  . Smoking status: Current Every Day Smoker    Packs/day: 0.50    Types: Cigarettes  . Smokeless tobacco: Never Used     Comment: Pt is on Chantix at present time  . Alcohol use Yes     Comment: no ETOH since 08/22/12     Colonoscopy:  PAP:  Bone density:   Allergies  Allergen Reactions  . Demerol Itching and Nausea And Vomiting  . Erythromycin Rash    Can take zpak    Current Outpatient Prescriptions  Medication Sig Dispense Refill  . cetirizine (ZYRTEC) 10 MG  tablet Take 1 tablet (10 mg total) by mouth daily. 30 tablet 11  . dexamethasone (DECADRON) 4 MG tablet Take 1 tablet (4 mg total) by mouth 2 (two) times daily. 60 tablet 0  . diphenoxylate-atropine (LOMOTIL) 2.5-0.025 MG tablet Take 2 tablets by mouth 4 (four) times daily as needed for diarrhea or loose stools. 40 tablet 0  . fluticasone (FLONASE) 50 MCG/ACT nasal spray Place 1 spray into the nose daily.    [name] gabapentin (NEURONTIN) 300 MG capsule Take 2 capsules (600 mg total) by mouth 3 (three) times daily. 300 mg at night for one day, twice daily for one day, then three times daily 180 capsule 2  . hyaluronate sodium (RADIAPLEXRX) GEL Apply 1 application topically.    [name] HYDROcodone-acetaminophen (NORCO) 7.5-325 MG tablet Take 1-2 tablets by mouth every 6 (six) hours as needed for severe pain (give one tab, then if other tab is needed, may give 30 min later.). 60 tablet 0  . ibuprofen (ADVIL,MOTRIN) 200 MG tablet Take 800 mg by mouth every 6 (six) hours as needed for mild pain.    [name] lamoTRIgine (LAMICTAL) 100 MG tablet Take 1.5 tablets (150 mg total) by mouth 2 (two) times daily. 60 tablet 3  . lidocaine-prilocaine (EMLA) cream Apply 1 application topically as needed. 30 g 0  . lithium carbonate 300 MG capsule Take 1 capsule (300 mg total) by mouth 2 (two) times daily with a meal. 60 capsule 3  . Loperamide HCl (IMODIUM PO) Take 1 tablet by mouth daily as needed (diarhea).    . LORazepam (ATIVAN) 0.5 MG tablet Take 1 tablet (0.5 mg total) by mouth every 8 (eight) hours as needed for anxiety. 30 tablet 1  . ondansetron (ZOFRAN) 8 MG tablet Take 1 tablet (8 mg total) by mouth every 8 (eight) hours as needed for nausea or vomiting. 30 tablet 1  . pantoprazole (PROTONIX) 40 MG tablet Take 1 tablet (40 mg total) by mouth daily. 30 tablet 3  . polyethylene glycol powder (GLYCOLAX/MIRALAX) powder Take 17 g by mouth 2 (two) times daily as needed for mild constipation. (Patient not taking: Reported on  08/13/2016) 3350 g 3  . potassium chloride (K-DUR) 10 MEQ tablet Take 1 tablet (10 mEq total) by mouth daily. 30 tablet 3  . prochlorperazine (COMPAZINE) 10 MG tablet Take 10 mg by mouth daily as needed for nausea.     [name] tiZANidine (ZANAFLEX) 4 MG tablet Take 1 tablet (4 mg total) by mouth daily. 30 tablet 0  . traMADol (ULTRAM) 50 MG tablet Take 1 tablet (50 mg total) by mouth every 6 (six) hours as needed. 60 tablet 0  . trazodone (DESYREL) 300 MG tablet Take 1 tablet (300 mg total)  by mouth at bedtime. 30 tablet 3  . triamcinolone cream (KENALOG) 0.5 % Apply 1 application topically daily as needed.  1  . trolamine salicylate (ASPERCREME) 10 % cream Apply 1 application topically as needed for muscle pain. Reported on 06/01/2016    . varenicline (CHANTIX) 0.5 MG tablet Take 1 tablet (0.5 mg total) by mouth daily. 30 tablet 3  . Vitamin D, Cholecalciferol, 1000 units CAPS Take 1,000 Units by mouth daily.     No current facility-administered medications for this visit.     Middle-aged white woman who appears stated age  nBP: (!) 144/69  Pulse: 77  Resp: 18  Temp: 97.7 F (36.5 C)     Body mass index is 25.43 kg/m.    ECOG FS:1 - Symptomatic but completely ambulatory  The craniotomy incision has healed nicely. There is some hair loss Sclerae unicteric, pupils round and equal Oropharynx clear and moist-- no thrush or other lesions No cervical or supraclavicular adenopathy Lungs no rales or rhonchi Heart regular rate and rhythm Abd soft, nontender, positive bowel sounds MSK no focal spinal tenderness, no upper extremity lymphedema Neuro: nonfocal, well oriented, appropriate affect Breasts: The right breast is unremarkable. The left breast is status post mastectomy and radiation. There is no evidence of chest wall recurrence. The left axilla is benign.  LAB RESULTS:  CMP     Component Value Date/Time   NA 140 08/13/2016 0926   NA 139 07/28/2016 1118   K 4.3 08/13/2016 0926   K 4.1  07/28/2016 1118   CL 107 08/13/2016 0926   CO2 25 08/13/2016 0926   CO2 26 07/28/2016 1118   GLUCOSE 96 08/13/2016 0926   GLUCOSE 91 07/28/2016 1118   BUN 8 08/13/2016 0926   BUN 8.6 07/28/2016 1118   CREATININE 0.94 08/13/2016 0926   CREATININE 0.8 07/28/2016 1118   CALCIUM 9.2 08/13/2016 0926   CALCIUM 9.7 07/28/2016 1118   PROT 6.7 07/28/2016 1118   ALBUMIN 4.0 07/28/2016 1118   AST 18 07/28/2016 1118   ALT 13 07/28/2016 1118   ALKPHOS 50 07/28/2016 1118   BILITOT <0.30 07/28/2016 1118   GFRNONAA >60 08/13/2016 0926   GFRAA >60 08/13/2016 0926    INo results found for: SPEP, UPEP  Lab Results  Component Value Date   WBC 5.0 08/13/2016   NEUTROABS 4.2 07/28/2016   HGB 12.9 08/13/2016   HCT 40.8 08/13/2016   MCV 96.9 08/13/2016   PLT 171 08/13/2016      Chemistry      Component Value Date/Time   NA 140 08/13/2016 0926   NA 139 07/28/2016 1118   K 4.3 08/13/2016 0926   K 4.1 07/28/2016 1118   CL 107 08/13/2016 0926   CO2 25 08/13/2016 0926   CO2 26 07/28/2016 1118   BUN 8 08/13/2016 0926   BUN 8.6 07/28/2016 1118   CREATININE 0.94 08/13/2016 0926   CREATININE 0.8 07/28/2016 1118      Component Value Date/Time   CALCIUM 9.2 08/13/2016 0926   CALCIUM 9.7 07/28/2016 1118   ALKPHOS 50 07/28/2016 1118   AST 18 07/28/2016 1118   ALT 13 07/28/2016 1118   BILITOT <0.30 07/28/2016 1118       No results found for: LABCA2  No components found for: LABCA125  No results for input(s): INR in the last 168 hours.  Urinalysis    Component Value Date/Time   COLORURINE YELLOW 04/18/2013 [place] 04/18/2013 1222   LABSPEC 1.011  04/18/2013 1222   PHURINE 5.5 04/18/2013 1222   GLUCOSEU NEGATIVE 04/18/2013 1222   HGBUR TRACE (A) 04/18/2013 1222   BILIRUBINUR negative 03/28/2015 1321   KETONESUR NEGATIVE 04/18/2013 1222   PROTEINUR negative 03/28/2015 1321   PROTEINUR NEGATIVE 04/18/2013 1222   UROBILINOGEN 0.2 03/28/2015 1321   UROBILINOGEN  0.2 04/18/2013 1222   NITRITE negative 03/28/2015 1321   NITRITE NEGATIVE 04/18/2013 1222   LEUKOCYTESUR Trace 03/28/2015 1321     STUDIES: Mr Jeri Cos QV Contrast  Result Date: 09/08/2016 CLINICAL DATA:  55 year old female with metastatic breast cancer. 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 Status post tumor resection September 2017 (presumably outside the [facility] system as no operative note or radiation treatment notes are identified in epic). Subsequent encounter. EXAM: MRI HEAD WITHOUT AND WITH CONTRAST TECHNIQUE: Multiplanar, multiecho pulse sequences of the brain and surrounding structures were obtained without and with intravenous contrast. CONTRAST:  32m MULTIHANCE GADOBENATE DIMEGLUMINE 529 MG/ML IV SOLN COMPARISON:  Pretreatment brain MRI 07/23/2016. FINDINGS: Brain: Left suboccipital postoperative changes are new. The previously-seen 2.4 cm cerebellar metastasis has been resected but there is residual stellate an curvilinear enhancement at the surgical bed. See series 10, image 19 and post contrast sagittal series 12, image 27. Some of this enhancement is suspicious for early leptomeningeal involvement. However, there is no cerebellar edema or mass effect. No other abnormal posterior fossa enhancement. No other abnormal intracranial enhancement. No restricted diffusion to suggest acute infarction. No midline shift, mass effect, ventriculomegaly, extra-axial collection or acute intracranial hemorrhage. Cervicomedullary junction and pituitary are within normal limits. Stable minimal nonspecific cerebral white matter T2 and FLAIR hyperintensity, including in the right temporal lobe. No associated enhancement. Vascular: Stable and normal, including the left sigmoid sinus. Skull and upper cervical spine: Negative visualized cervical spine and spinal cord. Visualized bone marrow signal is within normal limits. Sinuses/Orbits: Stable and normal orbits soft tissues. Visualized  paranasal sinuses and mastoids are stable and well pneumatized. Other: Visible internal auditory structures appear normal. Negative scalp soft tissues. IMPRESSION: 1. Interval resection of the will posterior left cerebellar metastasis. Residual small area of stellate and curvilinear enhancement without cerebellar edema, however, that seen on series 12, image 27 is suspicious for early leptomeningeal involvement. Consider short interval repeat brain MRI and CSF cytology evaluation. 2. No new metastatic disease identified. Electronically Signed   By: HGenevie AnnM.D.   On: 09/08/2016 11:08     ELIGIBLE FOR AVAILABLE RESEARCH PROTOCOL: no  ASSESSMENT: 55y.o. [place] woman with stage IV left-sided breast cancer involving bone and central nervous system  (1) s/p left breast lower inner quadrant biopsy 06/19/2015 for a clinical T2-3 NX invasive ductal carcinoma, grade 2, triple positive.  (2) status post left mastectomy and axillary lymph node dissection  with immediate expander placement 07/18/2015 for an mpT4 pN2,stage IIIB invasive ductal carcinoma, grade 3, with negative margins.  (a) definitive implant exchange to be scheduled in December   METASTATIC DISEASE: October 2016  (3) CT scan of the chest abdomen and pelvis  09/16/2015 shows metastatic lesions in the right scapula, left iliac crest, L4, and T spine. There were questionable liver cysts, with repeat CT scan 03/02/2016 showing possible right upper lobe lung lesions and possibly increased liver lesions  (a) CT scan of the chest 06/17/2016 shows no active disease in the lungs or liver  (b) Bone scan July 2017 showed no evidence of bony metastatic disease   (c) head CT 07/08/2016 showed a cerebellar lesion, confirmed  by MRI 07/23/2016, status post craniotomy 08/14/2016, confirming a metastatic deposit which was estrogen and progesterone receptor negative, HER-2 amplified with a signals ratio of 7.16, number Purcell 13.25  (4) received docetaxel  every 3 weeks 6 together with trastuzumab and pertuzumab, last docetaxel dose 02/11/2016  (5) adjuvant radiation7/03/2016 to 06/26/2016 at [facility]: 1. The Left chest wall was treated to 23.4 Gy in 13 fractions at 1.8 Gy per fraction. 2. The Left chest wall was boosted to 10 Gy in 5 fractions at 2 Gy per fraction. 3. The Left Sclav/PAB was treated to 23.4 Gy in 13 fractions at 1.8 Gy per fraction.  Including the patient's treatment in [place] (received 15 fractions per Dr. Maryan Rued near [place], [place]), the patient received 50.4 Gy to the left chest wall and supraclavicular region.   (6) started trastuzumab and pertuzumab October 2016, continuing every 3 weeks,  (a) most recent echocardiogram 02/26/2016 showed a well preserved ejection fraction  (b) echocardiogram 07/01/2016 shows an ejection fraction in the 60-65%   (c) pertuzumab discontinued 08/25/2016 with uncontrolled diarrhea  (7) started denosumab/Xgeva October 2017, repeated every 6 weeks  (8) started anastrozole October 2017   (9) history of bipolar disorder  (a) lithium level checked every 6 weeks  (10) mild anemia with a significant drop in the MCV, ferritin 10 06/03/2016,   (a) Feraheme given 06/12/2016 and 06/18/2016  (11) tobacco abuse: Chantix started 06/18/2016  (12) 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  (a) whole brain irradiation to be completed in 10/07/2016   PLAN: Catlynn had some uncontrolled anxiety today during the visit, demanding to receive pertuzumab today, because she is so afraid of cancer progression. I think her fears and anxiety is very understandable. However I do not think she would be able to tolerate pertuzumab at this point. She is quite weakenedby her radiation treatments and is already having some diarrhea problems. If she developed diarrhea as before she very likely would end up in the hospital.  She has been looking or studies, which I think is a  good thing. On the other hand we have excellent standard treatment which has already been proven to be effective. The time to consider studies more reasonably I think would be when we have nothing further to offer her. She was able to understand this. Nevertheless I encouraged her to continue to look at studies and if any become available within 50 miles a would be possible for her to participate. She understands that right now we have no studies here that she would qualify for.  We reviewed her lithium levels and she understands that I am not psychiatry  trained so all I can tell her is that the levels appear to be in the low but therapeutic range. She tells me that she does have an appointment with a counselor in family service scheduled for December 11. I gave her a copy of all her labs so that they can be reviewed there as well.  She will complete her radiation treatments to the whole brain tomorrow. Likely she will be set up for a repeat brain MRI by her radiation oncologist, Dr. Lisbeth Renshaw, in 6 or 8 weeks. She is ready scheduled for repeat echocardiogram in December. She will receive her trastuzumab today as well as her Xgeva. She is continuing on anastrozole with good tolerance.  I will see her again in 3 weeks. If she is stable overall we will consider re-starting pertuzumab at a lower dose at  that time     Shannon Cruel, MD   08/25/2016 1:59 PM Medical Oncology and Hematology [facility] [address] [place], [place] [address] Tel. [phone]    Fax. [phone]

## 2016-10-07 ENCOUNTER — Ambulatory Visit
Admission: RE | Admit: 2016-10-07 | Discharge: 2016-10-07 | Disposition: A | Discharge status: Home / Self Care | Payer: Medicaid Other | Source: Ambulatory Visit | Attending: Radiation Oncology | Admitting: Radiation Oncology

## 2016-10-07 ENCOUNTER — Encounter: Payer: Self-pay | Admitting: Radiation Oncology

## 2016-10-07 VITALS — BP 116/72 | HR 94 | Temp 98.2°F | Resp 18 | Ht 65.0 in | Wt 161.0 lb

## 2016-10-07 DIAGNOSIS — Z888 Allergy status to other drugs, medicaments and biological substances status: Secondary | ICD-10-CM | POA: Diagnosis not present

## 2016-10-07 DIAGNOSIS — Z510 Encounter for antineoplastic radiation therapy: Secondary | ICD-10-CM | POA: Diagnosis not present

## 2016-10-07 DIAGNOSIS — Z79899 Other long term (current) drug therapy: Secondary | ICD-10-CM | POA: Diagnosis not present

## 2016-10-07 DIAGNOSIS — Z923 Personal history of irradiation: Secondary | ICD-10-CM | POA: Diagnosis not present

## 2016-10-07 DIAGNOSIS — C7931 Secondary malignant neoplasm of brain: Secondary | ICD-10-CM | POA: Insufficient documentation

## 2016-10-07 LAB — CANCER ANTIGEN 27.29: CA 27.29: 9.7 U/mL (ref 0.0–38.6)

## 2016-10-07 MED ORDER — SONAFINE EX EMUL
1.0000 "application " | Freq: Two times a day (BID) | CUTANEOUS | Status: DC
  Administered 2016-10-07: 1 via TOPICAL

## 2016-10-07 MED FILL — GABAPENTIN 300 MG CAPSULE: 300 | 30 days supply | Qty: 180 | Fill #2

## 2016-10-07 MED FILL — LITHIUM CARBONATE 300 MG CA: 300 | 30 days supply | Qty: 60 | Fill #3

## 2016-10-07 NOTE — Progress Notes (Signed)
Department of Radiation Oncology  Phone:  [phone] Fax:        [phone]  Weekly Treatment Note    Name: [name] Date: 10/08/2016 MRN: [id number] DOB: 29-Jul-1961   Diagnosis:     ICD-9-CM ICD-10-CM   1. Brain metastasis (HCC) 198.3 C79.31 DISCONTINUED: SONAFINE emulsion 1 application     Current dose: 35 Gy  Current fraction:14   MEDICATIONS: Current Outpatient Prescriptions  Medication Sig Dispense Refill  . ALPRAZolam (XANAX) 1 MG tablet Take 1 mg by mouth as directed. Take xanax 1mg  tab oral 30 minutes before radaition treatments and every hour sleep,dispense 60 tabs no refill    . anastrozole (ARIMIDEX) 1 MG tablet Take 1 tablet (1 mg total) by mouth daily. 90 tablet 1  . BIOTIN PO Take 1 capsule by mouth daily.    . cetirizine (ZYRTEC) 10 MG tablet Take 1 tablet (10 mg total) by mouth daily. 30 tablet 11  . dexamethasone (DECADRON) 4 MG tablet Take 0.5 tablets (2 mg total) by mouth 2 (two) times daily. 60 tablet 0  . diphenoxylate-atropine (LOMOTIL) 2.5-0.025 MG tablet Take 2 tablets by mouth 4 (four) times daily as needed for diarrhea or loose stools. 40 tablet 0  . diphenoxylate-atropine (LOMOTIL) 2.5-0.025 MG tablet Take 2 tablets by mouth 4 (four) times daily as needed for diarrhea or loose stools. Called [facility] out pt pharmacy, dispense 40 tabs, no refill    . fluticasone (FLONASE) 50 MCG/ACT nasal spray Place 1 spray into the nose daily.    [name] gabapentin (NEURONTIN) 300 MG capsule Take 2 capsules (600 mg total) by mouth 3 (three) times daily. 300 mg at night for one day, twice daily for one day, then three times daily 180 capsule 2  . ibuprofen (ADVIL,MOTRIN) 200 MG tablet Take 800 mg by mouth every 6 (six) hours as needed for mild pain.    [name] lamoTRIgine (LAMICTAL) 100 MG tablet Take 1.5 tablets (150 mg total) by mouth 2 (two) times daily. 60 tablet 1  . lidocaine-prilocaine (EMLA) cream Apply 1 application topically as needed. 30 g 0  . lithium  carbonate 300 MG capsule Take 1 capsule (300 mg total) by mouth 2 (two) times daily with a meal. 60 capsule 3  . Loperamide HCl (IMODIUM PO) Take 1 tablet by mouth as needed (diarrhea).     . LORazepam (ATIVAN) 0.5 MG tablet Take 1 tablet (0.5 mg total) by mouth every 8 (eight) hours as needed for anxiety. 30 tablet 1  . MULTIPLE VITAMIN PO Take by mouth every morning.    . ondansetron (ZOFRAN) 8 MG tablet Take 1 tablet (8 mg total) by mouth every 8 (eight) hours as needed for nausea or vomiting. 30 tablet 1  . pantoprazole (PROTONIX) 40 MG tablet Take 1 tablet (40 mg total) by mouth daily. 30 tablet 3  . potassium chloride (K-DUR) 10 MEQ tablet Take 1 tablet (10 mEq total) by mouth daily. 30 tablet 3  . tiZANidine (ZANAFLEX) 4 MG tablet Take 1 tablet (4 mg total) by mouth daily. 30 tablet 0  . traMADol (ULTRAM) 50 MG tablet Take 1 tablet (50 mg total) by mouth every 6 (six) hours as needed. 60 tablet 0  . trazodone (DESYREL) 300 MG tablet Take 1 tablet (300 mg total) by mouth at bedtime. 30 tablet 3  . varenicline (CHANTIX) 0.5 MG tablet Take 1 tablet (0.5 mg total) by mouth daily. 30 tablet 3  . Vitamin D, Cholecalciferol, 1000 units CAPS Take 1,000  Units by mouth daily.    . Wound Dressings (SONAFINE) Apply 1 application topically daily. Apply to head after rad txs whern skin becomes itchy or irritated,     No current facility-administered medications for this encounter.      ALLERGIES: Demerol and Erythromycin   LABORATORY DATA:  Lab Results  Component Value Date   WBC 6.7 10/06/2016   HGB 13.5 10/06/2016   HCT 41.3 10/06/2016   MCV 100.7 10/06/2016   PLT 187 10/06/2016   Lab Results  Component Value Date   NA 141 10/06/2016   K 3.7 10/06/2016   CL 107 08/13/2016   CO2 27 10/06/2016   Lab Results  Component Value Date   ALT 19 10/06/2016   AST 16 10/06/2016   ALKPHOS 44 10/06/2016   BILITOT 0.30 10/06/2016     NARRATIVE: [name] was seen today for weekly  treatment management. The chart was checked and the patient's films were reviewed.  Weekly radiation treatment to whole brain 14/14 completed. Patient reports fatigue and short term memory loss with other people during her conversation. She is having headaches again, described as 1/10 on pain scale. She takes ibuprofen, then within 30 minutes to 1 hour takes tramadol. She reports it is constant dull top of temporal and occipital. She has had twitching of the right eye. She is taking decadron 2 mg daily in the morning. She is very anxious and depressed. She has sonafine and is using on edge of scalp and on her ears. Nursing notes, forehead and ears with redness and skin intact. She last saw Dr. [name] of medical oncology yesterday.  PHYSICAL EXAMINATION: height is 5\' 5"  (1.651 m) and weight is 161 lb (73 kg). Her oral temperature is 98.2 F (36.8 C). Her blood pressure is 116/72 and her pulse is 94. Her respiration is 18 and oxygen saturation is 95%.   Alert and in no acute distress.   Erythema present on the scalp especially in the forehead region with partial alopecia.  ASSESSMENT: The patient is doing satisfactorily with treatment.  PLAN: The patient has completed radiation treatment. She will return in 1 month for follow up to see [name], PA.  She is scheduled to follow up with Dr. [name] on 10/27/16.  She is currently taking 2 mg decadron daily in the morning. Advised her starting Monday, 11/13 to take 2 mg every other day for one week. After that week she will discontinue decadron. She was given written instructions on this steroid taper.     ------------------------------------------------  Jodelle Gross, MD, PhD  This document serves as a record of services personally performed by Kyung Rudd, MD. It was created on his behalf by Arlyce Harman, a trained medical scribe. The creation of this record is based on the scribe's personal observations and the provider's statements to  them. This document has been checked and approved by the attending provider.

## 2016-10-07 NOTE — Progress Notes (Signed)
Weekly rad tx whole brain 14/14 completed, patient, fatigued,short term memory loss with other people during her conservation, hasving headaches again 1/10 scale . Takes ibuprofen , then within 30 min-1 hour takes tramadol, constant dull top of temporal and occipital, has had twitching right eye, taking decadron only in the am, very anxious, Depressed, has sonafine,  using on edge of scalp and on her ears , forehead and ears with redness  skin intact.  EOT instructions given and one month follow up card to see [name], PA. Wt Readings from Last 3 Encounters:  10/07/16 161 lb (73 kg)  10/06/16 161 lb 3.2 oz (73.1 kg)  10/02/16 159 lb (72.1 kg)  BP 116/72   Pulse 94   Temp 98.2 F (36.8 C) (Oral)   Resp 18   Ht 5\' 5"  (1.651 m)   Wt 161 lb (73 kg)   SpO2 95%   BMI 26.79 kg/m

## 2016-10-08 ENCOUNTER — Other Ambulatory Visit: Payer: Self-pay

## 2016-10-08 ENCOUNTER — Ambulatory Visit: Payer: Self-pay

## 2016-10-12 ENCOUNTER — Encounter: Payer: Self-pay | Admitting: Radiation Oncology

## 2016-10-12 NOTE — Progress Notes (Signed)
°  Radiation Oncology         [id number]) [phone] ________________________________  Name: [name] MRN: [id number]  Date: 10/12/2016  DOB: 04-07-1961  End of Treatment Note  Diagnosis:   Metastatic stage IV left breast cancer with a solitary brain metastasis    Indication for treatment:  Curative       Radiation treatment dates:   09/17/16-10/07/16  Site/dose:   Whole brain/ 35 Gy in 14 fractions  Beams/energy:  Isodose plan/ 6 X  Narrative: The patient tolerated radiation treatment relatively well.  Patient reported dull headaches 1/10 during treatment. Treatment area was erythematous, and skin intact.  Plan: The patient has completed radiation treatment. The patient will return to radiation oncology clinic for routine followup in one month. I advised them to call or return sooner if they have any questions or concerns related to their recovery or treatment.  ------------------------------------------------  Jodelle Gross, MD, PhD  This document serves as a record of services personally performed by Kyung Rudd, MD. It was created on his behalf by Bethann Humble, a trained medical scribe. The creation of this record is based on the scribe's personal observations and the provider's statements to them. This document has been checked and approved by the attending provider.

## 2016-10-13 ENCOUNTER — Telehealth: Payer: Self-pay | Admitting: *Deleted

## 2016-10-13 MED FILL — traZODone HCL 150 MG TABS: 150 | 30 days supply | Qty: 60 | Fill #3

## 2016-10-13 MED FILL — CHANTIX 0.5 MG TABLET: 0.5 | 22 days supply | Qty: 22 | Fill #2

## 2016-10-13 MED FILL — ONDANSETRON HCL 8 MG TABLET: 8 | 10 days supply | Qty: 30 | Fill #1

## 2016-10-13 NOTE — Telephone Encounter (Signed)
Patient called and c/o shaking badly and clumsy x 1 week,  And some memory issues, is weaning off decadron, started 2mg   Yesterday and is on every other day  At present, works till 2pm then goes home, and rests, no fever, do I need to come in for a follow up sooner  With Bryson Ha?" will speak with Bryson Ha when she gets back from other office, today  And will call patient back  1:19 PM

## 2016-10-13 NOTE — Telephone Encounter (Signed)
Returned call  After speaking with  The PA Bryson Ha, patient now states she feels she has a cold coming on and her ears are clogged up, will call us Friday to see if she is worse, same or better  3:37 PM

## 2016-10-14 ENCOUNTER — Encounter: Payer: Self-pay | Admitting: *Deleted

## 2016-10-14 MED FILL — lamoTRIgine 100 MG TABS: 100 | 20 days supply | Qty: 60 | Fill #1

## 2016-10-14 MED FILL — POTASSIUM CL ER 10 MEQ TAB: 10 | 30 days supply | Qty: 30 | Fill #3

## 2016-10-14 MED FILL — MUCINEX D ER TABLET: 60-600 | 9 days supply | Qty: 18 | Fill #0

## 2016-10-14 NOTE — Telephone Encounter (Signed)
Thanks Thayer Headings, lets have her try Mucinex D which we discussed earlier and she has an rx for for cost purposes. Do you mind checking in with her next Monday or Tuesday to see how she's feeling, but to have her call sooner if she's increasingly symptomatic? Thanks, Bryson Ha

## 2016-10-14 NOTE — Progress Notes (Signed)
Called patient rx muicinex d, otc, 1 bottle of 18 tabs, 1  Tab oral bid x 1-2 weeks prn congestion, called [place] pharmacy also [phone]  Spoke with [place], she will call the patient when ready, Luna Glasgow and this RN for quick return call and for rx 11:30 AM

## 2016-10-20 ENCOUNTER — Telehealth: Payer: Self-pay | Admitting: *Deleted

## 2016-10-20 ENCOUNTER — Encounter: Payer: Self-pay | Admitting: *Deleted

## 2016-10-20 NOTE — Telephone Encounter (Signed)
Patient called and left vm to call her, called patient and spoke with [name], she stated she has been having  Little falls periodically and last night had a big fall getting out of her chair in her bedroom, and possibly tripped over her blanket,unsure, and  landed on her bottom and has a bif bruise there,  She takes ativan at night to calm her shaking stated, no fever or nausea stated, today last day of tapered her  Decadron,aksed if her cancer coming back in her brain?", sated I would run this by [name], our PA as Dr. Lisbeth Renshaw is out of the office and call her back 9:37 AM

## 2016-10-20 NOTE — Telephone Encounter (Signed)
This RN contacted pt per request per message with Mont Dutton in [place].  Evia states she is having ongoing sensorium changes including " shaky hands, falling down and then getting stuck and can't get up "   She denies any nausea, headache, visual changes.  Reviewed noted per her call to Thayer Headings in [employer] verifying inquiring requiring lifestyle concerns with pt verifying she is abstaining from ETOH.  Per discussion noted pt has been tapering off decadron with last dose of 2 mg today.  Symptoms started approximately 2 weeks ago when she tapered to 2 mg daily then to 2 mg every other day.  Rheann states she " is sleeping a lot and just can't seem to keep my eyes open "  This RN informed pt above will be reviewed with MD as well validated her concerns for possible recurrence of cancer - Informed Zaniya above may be related to long term use of decadron and recent tapering.  This RN will review with MD and return call to pt.

## 2016-10-20 NOTE — Progress Notes (Signed)
Called patient back, asked if patient's ativan could be causing the wobble clumsiness, and falls, also asked if patient had started drinking alcohol again, or taking xanax as well as ativan?, "NO to alcohol, no to the xanax, and I just started taking the ativan a few days ago, don't know what is causing all this shakiness, " stated patient, asked if she would contact her Primary MD, that we could see her next week as a follow up when Dr. Lisbeth Renshaw returns, she stated she would call Dr. Virgie Dad office as she doesn't have a primary MD, I will call her Monday to see haow she is doing, informed [name] PA

## 2016-10-21 ENCOUNTER — Telehealth: Payer: Self-pay | Admitting: *Deleted

## 2016-10-21 ENCOUNTER — Other Ambulatory Visit: Payer: Self-pay | Admitting: Radiation Therapy

## 2016-10-21 DIAGNOSIS — C7949 Secondary malignant neoplasm of other parts of nervous system: Secondary | ICD-10-CM

## 2016-10-21 DIAGNOSIS — C7931 Secondary malignant neoplasm of brain: Secondary | ICD-10-CM

## 2016-10-21 MED ORDER — DEXAMETHASONE 2 MG PO TABS: 2.0000 mg | ORAL_TABLET | Freq: Every day | ORAL | 0 refills | Status: DC

## 2016-10-21 MED FILL — DEXAMETHASONE 4 MG TABLET: 4 | 30 days supply | Qty: 15 | Fill #0

## 2016-10-21 NOTE — Telephone Encounter (Signed)
Per MD review of noted symptoms - recommended for pt to resume decadron at 2 mg a day until she is seen on the 28 th of this month.  This RN returned call to pt and obtained identified VM- detailed message left per above including refill on decadron has been sent to the [facility].

## 2016-10-27 ENCOUNTER — Other Ambulatory Visit: Payer: Self-pay | Admitting: *Deleted

## 2016-10-27 ENCOUNTER — Other Ambulatory Visit (HOSPITAL_BASED_OUTPATIENT_CLINIC_OR_DEPARTMENT_OTHER): Payer: Medicaid Other

## 2016-10-27 ENCOUNTER — Ambulatory Visit (HOSPITAL_BASED_OUTPATIENT_CLINIC_OR_DEPARTMENT_OTHER): Payer: Medicaid Other | Admitting: Oncology

## 2016-10-27 ENCOUNTER — Encounter: Payer: Self-pay | Admitting: *Deleted

## 2016-10-27 ENCOUNTER — Ambulatory Visit (HOSPITAL_BASED_OUTPATIENT_CLINIC_OR_DEPARTMENT_OTHER): Payer: Medicaid Other

## 2016-10-27 VITALS — BP 115/73 | HR 88 | Temp 97.9°F | Resp 18 | Ht 65.0 in | Wt 163.0 lb

## 2016-10-27 DIAGNOSIS — C50312 Malignant neoplasm of lower-inner quadrant of left female breast: Secondary | ICD-10-CM | POA: Diagnosis present

## 2016-10-27 DIAGNOSIS — Z5112 Encounter for antineoplastic immunotherapy: Secondary | ICD-10-CM

## 2016-10-27 DIAGNOSIS — C7951 Secondary malignant neoplasm of bone: Secondary | ICD-10-CM

## 2016-10-27 DIAGNOSIS — C7931 Secondary malignant neoplasm of brain: Secondary | ICD-10-CM | POA: Diagnosis not present

## 2016-10-27 DIAGNOSIS — R197 Diarrhea, unspecified: Secondary | ICD-10-CM

## 2016-10-27 DIAGNOSIS — Z720 Tobacco use: Secondary | ICD-10-CM

## 2016-10-27 DIAGNOSIS — C773 Secondary and unspecified malignant neoplasm of axilla and upper limb lymph nodes: Secondary | ICD-10-CM | POA: Diagnosis not present

## 2016-10-27 DIAGNOSIS — Z170 Estrogen receptor positive status [ER+]: Secondary | ICD-10-CM

## 2016-10-27 DIAGNOSIS — F319 Bipolar disorder, unspecified: Secondary | ICD-10-CM | POA: Diagnosis not present

## 2016-10-27 LAB — CBC WITH DIFFERENTIAL/PLATELET
BASO%: 0.1 % (ref 0.0–2.0)
Basophils Absolute: 0 10*3/uL (ref 0.0–0.1)
EOS%: 0.2 % (ref 0.0–7.0)
Eosinophils Absolute: 0 10*3/uL (ref 0.0–0.5)
HCT: 37.7 % (ref 34.8–46.6)
HGB: 12.5 g/dL (ref 11.6–15.9)
LYMPH%: 5.4 % — ABNORMAL LOW (ref 14.0–49.7)
MCH: 33.2 pg (ref 25.1–34.0)
MCHC: 33.2 g/dL (ref 31.5–36.0)
MCV: 100 fL (ref 79.5–101.0)
MONO#: 0.4 10*3/uL (ref 0.1–0.9)
MONO%: 3.7 % (ref 0.0–14.0)
NEUT#: 9.3 10*3/uL — ABNORMAL HIGH (ref 1.5–6.5)
NEUT%: 90.6 % — ABNORMAL HIGH (ref 38.4–76.8)
Platelets: 184 10*3/uL (ref 145–400)
RBC: 3.77 10*6/uL (ref 3.70–5.45)
RDW: 13.7 % (ref 11.2–14.5)
WBC: 10.3 10*3/uL (ref 3.9–10.3)
lymph#: 0.6 10*3/uL — ABNORMAL LOW (ref 0.9–3.3)

## 2016-10-27 LAB — COMPREHENSIVE METABOLIC PANEL
ALT: 18 U/L (ref 0–55)
AST: 18 U/L (ref 5–34)
Albumin: 3.8 g/dL (ref 3.5–5.0)
Alkaline Phosphatase: 48 U/L (ref 40–150)
Anion Gap: 8 mEq/L (ref 3–11)
BUN: 11.3 mg/dL (ref 7.0–26.0)
CO2: 26 mEq/L (ref 22–29)
Calcium: 9.7 mg/dL (ref 8.4–10.4)
Chloride: 104 mEq/L (ref 98–109)
Creatinine: 0.8 mg/dL (ref 0.6–1.1)
EGFR: 80 mL/min/{1.73_m2} — ABNORMAL LOW (ref >90)
Glucose: 124 mg/dl (ref 70–140)
Potassium: 4.3 mEq/L (ref 3.5–5.1)
Sodium: 138 mEq/L (ref 136–145)
Total Bilirubin: 0.3 mg/dL (ref 0.20–1.20)
Total Protein: 6.4 g/dL (ref 6.4–8.3)

## 2016-10-27 MED ORDER — FLUTICASONE PROPIONATE 50 MCG/ACT NA SUSP: 2.0000 | Freq: Every day | NASAL | 3 refills | Status: DC

## 2016-10-27 MED ORDER — DIPHENOXYLATE-ATROPINE 2.5-0.025 MG PO TABS: 2.0000 | ORAL_TABLET | Freq: Four times a day (QID) | ORAL | 0 refills | Status: DC | PRN

## 2016-10-27 MED ORDER — CHOLESTYRAMINE 4 G PO PACK: 4.0000 g | PACK | Freq: Three times a day (TID) | ORAL | 12 refills | Status: DC

## 2016-10-27 MED ORDER — DIPHENHYDRAMINE HCL 25 MG PO CAPS: 25.0000 mg | ORAL_CAPSULE | Freq: Once | ORAL | Status: DC

## 2016-10-27 MED ORDER — ACETAMINOPHEN 325 MG PO TABS: 650.0000 mg | ORAL_TABLET | Freq: Once | ORAL | Status: DC

## 2016-10-27 MED ORDER — DIPHENHYDRAMINE HCL 25 MG PO CAPS
ORAL_CAPSULE | ORAL | Status: AC
  Filled 2016-10-27: qty 1

## 2016-10-27 MED ORDER — PSEUDOEPHEDRINE-GUAIFENESIN ER 60-600 MG PO TB12: 1.0000 | ORAL_TABLET | Freq: Two times a day (BID) | ORAL | 1 refills | Status: DC | PRN

## 2016-10-27 MED ORDER — HEPARIN SOD (PORK) LOCK FLUSH 100 UNIT/ML IV SOLN
500.0000 [IU] | Freq: Once | INTRAVENOUS | Status: AC | PRN
  Administered 2016-10-27: 500 [IU]
  Filled 2016-10-27: qty 5

## 2016-10-27 MED ORDER — ACETAMINOPHEN 325 MG PO TABS
ORAL_TABLET | ORAL | Status: AC
  Filled 2016-10-27: qty 2

## 2016-10-27 MED ORDER — SODIUM CHLORIDE 0.9 % IV SOLN
Freq: Once | INTRAVENOUS | Status: AC
  Administered 2016-10-27: 10:00:00 via INTRAVENOUS

## 2016-10-27 MED ORDER — SODIUM CHLORIDE 0.9% FLUSH
10.0000 mL | INTRAVENOUS | Status: DC | PRN
  Administered 2016-10-27: 10 mL
  Filled 2016-10-27: qty 10

## 2016-10-27 MED ORDER — TRASTUZUMAB CHEMO 150 MG IV SOLR
6.0000 mg/kg | Freq: Once | INTRAVENOUS | Status: AC
  Administered 2016-10-27: 420 mg via INTRAVENOUS
  Filled 2016-10-27: qty 20

## 2016-10-27 MED ORDER — SODIUM CHLORIDE 0.9 % IV SOLN
210.0000 mg | Freq: Once | INTRAVENOUS | Status: AC
  Administered 2016-10-27: 210 mg via INTRAVENOUS
  Filled 2016-10-27: qty 7

## 2016-10-27 MED FILL — FLUTICASONE PROP 50 MCG SPR: 50 | 30 days supply | Qty: 16 | Fill #0 | Status: TO

## 2016-10-27 MED FILL — ALL DAY ALLERGY 10 MG TAB: 10 | 100 days supply | Qty: 100 | Fill #1 | Status: TO

## 2016-10-27 MED FILL — CHOLESTYRAMINE PACKET: 4 | 20 days supply | Qty: 60 | Fill #0 | Status: TO

## 2016-10-27 MED FILL — MUCINEX D ER TABLET: 60-600 | 9 days supply | Qty: 18 | Fill #0

## 2016-10-27 NOTE — Progress Notes (Signed)
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 Since her last visit here she completed her brain irradiation. She fells very tired and had some balance problems related to this but that is improving. Of course she has lost most of her hair. She had no nausea or vomiting issues.  Today she receives trastuzumab and we are adding pertuzumab at a low dose to see if we can resume the dual anti-HER-2 treatment. She will be due for Xgeva with her next trastuzumab dose, since she receives that every 6 weeks. She is also on anastrozole which she tolerates well.  REVIEW OF SYSTEMS: [name] tells me she has been able to do some outdoors work, which is a source of photons. She is still has some sinus problems, sciatica, and anxiety and depression issues. She is hoping to eventually get that to [facility] cord but expects to be here at least several more months. She says her appetite is increased and she is gaining a lot of weight. Bowel movements are now normal. A detailed review of systems today was otherwise stable.   BREAST CANCER HISTORY: From the original intake note:  [name] was aware of a "lemon sized lump in" her left axilla for about a year before bringing it to medical attention. By then she had developed left breast and left axillary swelling (June 2016). She presented to the local emergency room and had a chest CT scan 06/06/2015 which  showed a nodule in the left breast measuring 0.9 cm and questionable left axillary adenopathy. She then proceeded to bilateral diagnostic mammography and left breast ultrasonography 06/19/2015. There were no prior films for comparison (last mammography 12 years prior).. The breast density was category C. Mammography showed in the left breast upper inner quadrant a 7 cm area including a small mass and significant pleomorphic calcifications. Ultrasonography defined the mass as measuring 1.2 cm. The left axilla appeared unremarkable. There was significant skin edema.  Biopsy of the left breast mass 06/19/2015 showed (SP [id number]) an invasive ductal carcinoma, grade 2, estrogen receptor 83% positive, progesterone receptor 26% positive, and HER-2 amplified by immunohistochemstry with a 3+ reading. The patient had biopsies of a separate area in the left breast August of the same year and this showed atypical ductal hyperplasia. (SP [id number]).  Accordingly after appropriate discussion on 08/21/2015 the patient proceeded to left mastectomy with left axillary sentinel lymph node sampling, which, since the lymph nodes were positive, extended to the procedure to left axillary lymph node dissection. The pathology (SP [id number]) showed an invasive ductal carcinoma, grade 3, measuring in excess of 9 cm. There were also skin satellites, not contiguous with the invasive carcinoma. Margins were clear and ample. There was evidence of lymphovascular invasion. A total of 15 lymph nodes were removed, including 5 sentinel lymph nodes, all of which were positive, so that the final total was 14 out of 15 lymph nodes involved by tumor. There was evidence of extranodal extension. The final pathology was pT4b pN2a, stage IIIB  CA-27-29 and CEA 09/19/2015 were non-informative October 2016.  Unfortunately CT scans of the chest abdomen and pelvis 09/16/2015 showed bony metastases to the right scapula, left iliac crest, and also L4 and  T-spine. There were questionable liver cysts which on repeat CT scan 03/02/2016 appear to be a little bit more well-defined, possibly a little larger. There were also some possible right upper lobe lung lesions.  Adjuvant treatment consisted of docetaxel, trastuzumab and pertuzumab, with the final (6th) docetaxel dose given 02/11/2016. She continues on trastuzumab and pertuzumab, with the 11th cycle given 05/05/2016. Echocardiogram 02/26/2016 showed an ejection fraction of 55%. She receives denosumab/Xgeva every 6 weeks.. She also receives radiation, started 06/09/201, to be completed 06/26/2016.  Her subsequent history is as detailed below   PAST MEDICAL HISTORY: Past Medical History:  Diagnosis Date  . Alcohol abuse   . Anemia    during chemo  . Anxiety    At age 41  . Arthritis Dx 2010  . Bipolar disorder ([place])   . Cancer (HCC)    breast  . Chronic pain   . Depression   . Fibromyalgia Dx 2005  . GERD (gastroesophageal reflux disease)   . Headache    hx  migraines  . Opiate dependence ([place])   . Port-a-cath in place   . PTSD (post-traumatic stress disorder)     PAST SURGICAL HISTORY: Past Surgical History:  Procedure Laterality Date  . APPLICATION OF CRANIAL NAVIGATION N/A 08/14/2016   Procedure: APPLICATION OF CRANIAL NAVIGATION;  Surgeon: Erline Levine, MD;  Location: [facility];  Service: Neurosurgery;  Laterality: N/A;  . BREAST RECONSTRUCTION Left    with silicone implant  . CRANIOTOMY N/A 08/14/2016   Procedure: CRANIOTOMY TUMOR EXCISION WITH Lucky Rathke;  Surgeon: Erline Levine, MD;  Location: [facility];  Service: Neurosurgery;  Laterality: N/A;  . FIBULA FRACTURE SURGERY    . MASTECTOMY Left   . RADIOLOGY WITH ANESTHESIA N/A 07/23/2016   Procedure: MRI OF BRAIN WITH AND WITHOUT;  Surgeon: Medication Radiologist, MD;  Location: [facility];  Service: Radiology;  Laterality: N/A;  . right power port placement Right     FAMILY HISTORY Family History  Problem Relation Age  of Onset  . Diabetes Mother   . Bipolar disorder Mother   . CAD Father   The patient's father still living, age 6, in [place]. He had prostate cancer at some point in the past. The patient's mother died at age 81 from complications of diabetes. The patient had no brothers, 2 sisters. A paternal grandmother had lung cancer in the setting of tobacco abuse. There is no other history of cancer in the family to her knowledge  GYNECOLOGIC HISTORY:  No LMP recorded. Patient has had an ablation. Menarche approximately age 72. First live birth in 76. The patient is GX P2. She underwent endometrial ablation in 2016.  SOCIAL HISTORY:  The patient is originally from [place]. She has lived in [place] before but more recently was in [place]. She is back here because she could not afford her rent in [place]. She is living here and a temporary situation. She is divorced. HER-2 children are [name] who lives in [place] and works as a Development worker, community, and [name] who also lives in [place] and works as a Catering manager. The patient has a grandchild, [name], 47 years old as of July 2017, living in [place] with his mother. The patient has not established herself with a local church yet.    ADVANCED DIRECTIVES: Not in place;  at the 06/03/2016 visit the patient was given the appropriate forms to complete and notarize at her discretion   HEALTH MAINTENANCE: Social History  Substance Use Topics  . Smoking status: Current Every Day Smoker    Packs/day: 0.50    Types: Cigarettes  . Smokeless tobacco: Never Used     Comment: Pt is on Chantix at present time  . Alcohol use Yes     Comment: no ETOH since 08/22/12     Colonoscopy:  PAP:  Bone density:   Allergies  Allergen Reactions  . Demerol Itching and Nausea And Vomiting  . Erythromycin Rash    Can take zpak    Current Outpatient Prescriptions  Medication Sig Dispense Refill  . cetirizine (ZYRTEC) 10 MG tablet Take 1 tablet (10 mg  total) by mouth daily. 30 tablet 11  . dexamethasone (DECADRON) 4 MG tablet Take 1 tablet (4 mg total) by mouth 2 (two) times daily. 60 tablet 0  . diphenoxylate-atropine (LOMOTIL) 2.5-0.025 MG tablet Take 2 tablets by mouth 4 (four) times daily as needed for diarrhea or loose stools. 40 tablet 0  . fluticasone (FLONASE) 50 MCG/ACT nasal spray Place 1 spray into the nose daily.    [name] gabapentin (NEURONTIN) 300 MG capsule Take 2 capsules (600 mg total) by mouth 3 (three) times daily. 300 mg at night for one day, twice daily for one day, then three times daily 180 capsule 2  . hyaluronate sodium (RADIAPLEXRX) GEL Apply 1 application topically.    [name] HYDROcodone-acetaminophen (NORCO) 7.5-325 MG tablet Take 1-2 tablets by mouth every 6 (six) hours as needed for severe pain (give one tab, then if other tab is needed, may give 30 min later.). 60 tablet 0  . ibuprofen (ADVIL,MOTRIN) 200 MG tablet Take 800 mg by mouth every 6 (six) hours as needed for mild pain.    [name] lamoTRIgine (LAMICTAL) 100 MG tablet Take 1.5 tablets (150 mg total) by mouth 2 (two) times daily. 60 tablet 3  . lidocaine-prilocaine (EMLA) cream Apply 1 application topically as needed. 30 g 0  . lithium carbonate 300 MG capsule Take 1 capsule (300 mg total) by mouth 2 (two) times daily with a meal. 60 capsule 3  . Loperamide HCl (IMODIUM PO) Take 1 tablet by mouth daily as needed (diarhea).    . LORazepam (ATIVAN) 0.5 MG tablet Take 1 tablet (0.5 mg total) by mouth every 8 (eight) hours as needed for anxiety. 30 tablet 1  . ondansetron (ZOFRAN) 8 MG tablet Take 1 tablet (8 mg total) by mouth every 8 (eight) hours as needed for nausea or vomiting. 30 tablet 1  . pantoprazole (PROTONIX) 40 MG tablet Take 1 tablet (40 mg total) by mouth daily. 30 tablet 3  . polyethylene glycol powder (GLYCOLAX/MIRALAX) powder Take 17 g by mouth 2 (two) times daily as needed for mild constipation. (Patient not taking: Reported on 08/13/2016) 3350 g 3  . potassium  chloride (K-DUR) 10 MEQ tablet Take 1 tablet (10 mEq total) by mouth daily. 30 tablet 3  . prochlorperazine (COMPAZINE) 10 MG tablet Take 10 mg by mouth daily as needed for nausea.     [name] tiZANidine (ZANAFLEX) 4 MG tablet Take 1 tablet (4 mg total) by mouth daily. 30 tablet 0  . traMADol (ULTRAM) 50 MG tablet Take 1 tablet (50 mg total) by mouth every 6 (six) hours as needed. 60 tablet 0  . trazodone (DESYREL) 300 MG tablet Take 1 tablet (300 mg total) by mouth at  bedtime. 30 tablet 3  . triamcinolone cream (KENALOG) 0.5 % Apply 1 application topically daily as needed.  1  . trolamine salicylate (ASPERCREME) 10 % cream Apply 1 application topically as needed for muscle pain. Reported on 06/01/2016    . varenicline (CHANTIX) 0.5 MG tablet Take 1 tablet (0.5 mg total) by mouth daily. 30 tablet 3  . Vitamin D, Cholecalciferol, 1000 units CAPS Take 1,000 Units by mouth daily.     No current facility-administered medications for this visit.     OBJECTIVE: Middle-aged white woman who appears stated age  48:   10/27/16 0819  BP: 115/73  Pulse: 88  Resp: 18  Temp: 97.9 F (36.6 C)  TempSrc: Oral  SpO2: 96%  Weight: 163 lb (73.9 kg)  Height: 5' 5"  (1.651 m)  Body mass index is 27.12 kg/m.  Sclerae unicteric, EOMs intact Oropharynx clear and moist, no thrush No cervical or supraclavicular adenopathy Lungs no rales or rhonchi Heart regular rate and rhythm Abd soft, nontender, positive bowel sounds MSK no focal spinal tenderness, no upper extremity lymphedema Neuro: nonfocal, well oriented, appropriate affect Breasts: Right breast is benign. The left breast is status post mastectomy and radiation. There is no evidence of chest wall recurrence. Left axilla is benign  LAB RESULTS:  CMP     Component Value Date/Time   NA 140 08/13/2016 0926   NA 139 07/28/2016 1118   K 4.3 08/13/2016 0926   K 4.1 07/28/2016 1118   CL 107 08/13/2016 0926   CO2 25 08/13/2016 0926   CO2 26 07/28/2016  1118   GLUCOSE 96 08/13/2016 0926   GLUCOSE 91 07/28/2016 1118   BUN 8 08/13/2016 0926   BUN 8.6 07/28/2016 1118   CREATININE 0.94 08/13/2016 0926   CREATININE 0.8 07/28/2016 1118   CALCIUM 9.2 08/13/2016 0926   CALCIUM 9.7 07/28/2016 1118   PROT 6.7 07/28/2016 1118   ALBUMIN 4.0 07/28/2016 1118   AST 18 07/28/2016 1118   ALT 13 07/28/2016 1118   ALKPHOS 50 07/28/2016 1118   BILITOT <0.30 07/28/2016 1118   GFRNONAA >60 08/13/2016 0926   GFRAA >60 08/13/2016 0926    INo results found for: SPEP, UPEP  Lab Results  Component Value Date   WBC 5.0 08/13/2016   NEUTROABS 4.2 07/28/2016   HGB 12.9 08/13/2016   HCT 40.8 08/13/2016   MCV 96.9 08/13/2016   PLT 171 08/13/2016      Chemistry      Component Value Date/Time   NA 140 08/13/2016 0926   NA 139 07/28/2016 1118   K 4.3 08/13/2016 0926   K 4.1 07/28/2016 1118   CL 107 08/13/2016 0926   CO2 25 08/13/2016 0926   CO2 26 07/28/2016 1118   BUN 8 08/13/2016 0926   BUN 8.6 07/28/2016 1118   CREATININE 0.94 08/13/2016 0926   CREATININE 0.8 07/28/2016 1118      Component Value Date/Time   CALCIUM 9.2 08/13/2016 0926   CALCIUM 9.7 07/28/2016 1118   ALKPHOS 50 07/28/2016 1118   AST 18 07/28/2016 1118   ALT 13 07/28/2016 1118   BILITOT <0.30 07/28/2016 1118       No results found for: LABCA2  No components found for: LABCA125  No results for input(s): INR in the last 168 hours.  Urinalysis    Component Value Date/Time   COLORURINE YELLOW 04/18/2013 1222   APPEARANCEUR CLEAR 04/18/2013 1222   LABSPEC 1.011 04/18/2013 1222   PHURINE 5.5 04/18/2013 1222  GLUCOSEU NEGATIVE 04/18/2013 1222   HGBUR TRACE (A) 04/18/2013 1222   BILIRUBINUR negative 03/28/2015 1321   KETONESUR NEGATIVE 04/18/2013 1222   PROTEINUR negative 03/28/2015 1321   PROTEINUR NEGATIVE 04/18/2013 1222   UROBILINOGEN 0.2 03/28/2015 1321   UROBILINOGEN 0.2 04/18/2013 1222   NITRITE negative 03/28/2015 1321   NITRITE NEGATIVE 04/18/2013 1222    LEUKOCYTESUR Trace 03/28/2015 1321     STUDIES: No results found.   ELIGIBLE FOR AVAILABLE RESEARCH PROTOCOL: no  ASSESSMENT: 55 y.o. [place] woman with stage IV left-sided breast cancer involving bone and central nervous system  (1) s/p left breast lower inner quadrant biopsy 06/19/2015 for a clinical T2-3 NX invasive ductal carcinoma, grade 2, triple positive.  (2) status post left mastectomy and axillary lymph node dissection  with immediate expander placement 07/18/2015 for an mpT4 pN2,stage IIIB invasive ductal carcinoma, grade 3, with negative margins.  (a) definitive implant exchange to be scheduled in December   METASTATIC DISEASE: October 2016  (3) CT scan of the chest abdomen and pelvis  09/16/2015 shows metastatic lesions in the right scapula, left iliac crest, L4, and T spine. There were questionable liver cysts, with repeat CT scan 03/02/2016 showing possible right upper lobe lung lesions and possibly increased liver lesions  (a) CT scan of the chest 06/17/2016 shows no active disease in the lungs or liver  (b) Bone scan July 2017 showed no evidence of bony metastatic disease   (c) head CT 07/08/2016 showed a cerebellar lesion, confirmed by MRI 07/23/2016, status post craniotomy 08/14/2016, confirming a metastatic deposit which was estrogen and progesterone receptor negative, HER-2 amplified with a signals ratio of 7.16, number per cell 13.25  (d) CA 27-29 not informative  (4) received docetaxel every 3 weeks 6 together with trastuzumab and pertuzumab, last docetaxel dose 02/11/2016  (5) adjuvant radiation7/03/2016 to 06/26/2016 at [facility]: 1. The Left chest wall was treated to 23.4 Gy in 13 fractions at 1.8 Gy per fraction. 2. The Left chest wall was boosted to 10 Gy in 5 fractions at 2 Gy per fraction. 3. The Left Sclav/PAB was treated to 23.4 Gy in 13 fractions at 1.8 Gy per fraction.  Including the patient's treatment in [place] (received 15 fractions per  Dr. Maryan Rued near [place], [place]), the patient received 50.4 Gy to the left chest wall and supraclavicular region.   (6) started trastuzumab and pertuzumab October 2016, continuing every 3 weeks,  (a) echocardiogram 02/26/2016 showed a well preserved ejection fraction  (b) echocardiogram 07/01/2016 shows an ejection fraction in the 60-65%   (c) pertuzumab discontinued 08/25/2016 with uncontrolled diarrhea  (7) started denosumab/Xgeva October 2017, repeated every 6 weeks  (8) started anastrozole October 2017   (9) history of bipolar disorder  (a) lithium level checked every 6 weeks  (10) mild anemia with a significant drop in the MCV, ferritin 10 06/03/2016,   (a) Feraheme given 06/12/2016 and 06/18/2016  (11) tobacco abuse: Chantix started 06/18/2016  (12) 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  (a) brain irradiation10/19/17-11/08/17: Whole brain/ 35 Gy in 14 fractions    PLAN: [name] will proceed to trastuzumab again today and we will try to add pertuzumab at a low dose. If she tolerates it well we can try to increase the dose to the usual maintenance level.  She was interested in subutech for control of diarrhea. However when I gave her information on the medication she realized it was not appropriate. We extensively discussed how to manage her  diarrhea. Unfortunately she does not have any funds to purchase Imodium. I did write her a prescription for Lomotil and for Questran both of which she can obtain at to her from our [facility]. At the very first hint of diarrhea she will start the Questran and take it twice daily which she will take the Lomotil after each diarrheal bowel movement until the diarrhea resolves. If that does not work she will let us know and we can give her hydration here as needed.  Her former counselor is no longer at the clinic where [name] is evaluated and [name] has requested an appointment with a different  counselor but this has not yet happened. I explained again that I am uncomfortable managing her lithium And that it is imperative for her to establish yourself with a counselor who is experienced in her psychological and psychiatric problems.  Angelene is understandably anxious regarding her metastatic disease particularly to the brain. I'm going to restage her with CT of the chest and bone scan in December which she will have those results by the time she returns here for her next treatment 11/17/2016. I believe Dr. Lisbeth Renshaw is setting her up for repeat brain MRI sometime in January.  I will see [name] again 12/09/2015, but of course we can work her in at other times if and when the need arises.    Chauncey Cruel, MD   08/25/2016 1:59 PM Medical Oncology and Hematology [facility] [address] [place], [place] [address] Tel. [phone]    Fax. [phone]

## 2016-10-27 NOTE — Patient Instructions (Signed)
[facility] Discharge Instructions for Patients Receiving Chemotherapy  Today you received the following chemotherapy agents Herceptin and Perjeta   To help prevent nausea and vomiting after your treatment, we encourage you to take your nausea medication as directed.    If you develop nausea and vomiting that is not controlled by your nausea medication, call the clinic.   BELOW ARE SYMPTOMS THAT SHOULD BE REPORTED IMMEDIATELY:  *FEVER GREATER THAN 100.5 F  *CHILLS WITH OR WITHOUT FEVER  NAUSEA AND VOMITING THAT IS NOT CONTROLLED WITH YOUR NAUSEA MEDICATION  *UNUSUAL SHORTNESS OF BREATH  *UNUSUAL BRUISING OR BLEEDING  TENDERNESS IN MOUTH AND THROAT WITH OR WITHOUT PRESENCE OF ULCERS  *URINARY PROBLEMS  *BOWEL PROBLEMS  UNUSUAL RASH Items with * indicate a potential emergency and should be followed up as soon as possible.  Feel free to call the clinic you have any questions or concerns. The clinic phone number is [phone].  Please show the CHEMO ALERT CARD at check-in to the Emergency Department and triage nurse.   

## 2016-10-28 ENCOUNTER — Other Ambulatory Visit: Payer: Self-pay | Admitting: Oncology

## 2016-10-28 DIAGNOSIS — R110 Nausea: Secondary | ICD-10-CM

## 2016-10-28 LAB — LITHIUM LEVEL: Lithium: 0.9 mmol/L (ref 0.6–1.2)

## 2016-10-28 LAB — CANCER ANTIGEN 27.29: CA 27.29: 11.3 U/mL (ref 0.0–38.6)

## 2016-10-29 MED FILL — PANTOPRAZOLE SOD DR 40 MG T: 40 | 30 days supply | Qty: 30 | Fill #0 | Status: TO

## 2016-11-02 ENCOUNTER — Telehealth: Payer: Self-pay | Admitting: *Deleted

## 2016-11-02 ENCOUNTER — Other Ambulatory Visit: Payer: Self-pay | Admitting: Radiation Oncology

## 2016-11-02 DIAGNOSIS — C7931 Secondary malignant neoplasm of brain: Secondary | ICD-10-CM

## 2016-11-02 DIAGNOSIS — C50312 Malignant neoplasm of lower-inner quadrant of left female breast: Secondary | ICD-10-CM

## 2016-11-02 MED ORDER — ALPRAZOLAM 1 MG PO TABS: 1.0000 mg | ORAL_TABLET | ORAL | 0 refills | Status: DC

## 2016-11-02 NOTE — Telephone Encounter (Signed)
patient called and asked for refill on xanax last filled by [name], PA on 09/10/16 , says she only takes it to sleep which helps, also she is scheduled to see Bryson Ha on 11/24/16 for follow up, but has MRI brain scheduled for 12/10/16 and follow up with Dr. Lisbeth Renshaw ion 12/14/16, asking if she should keep both appts or just after her MRI see MD or Bryson Ha, will call back once I speak with [name], 8:49 AM

## 2016-11-02 NOTE — Telephone Encounter (Signed)
Called  Patient can pick up rx for xanax at nursing station today or tomorrow,patient thanked [name] for writing rx of xanax will get either today or tomorrrow 9:44 AM

## 2016-11-02 NOTE — Telephone Encounter (Signed)
Patient c/o neck pain wanted [name] to write rx for tramadol, informed patient no, that she would need to have Dr, [name]  Or other MD issue meds for pain, protonix,etc,  1:03 PM

## 2016-11-03 ENCOUNTER — Other Ambulatory Visit: Payer: Self-pay | Admitting: Emergency Medicine

## 2016-11-03 MED ORDER — TRAMADOL HCL 50 MG PO TABS
50.0000 mg | ORAL_TABLET | Freq: Four times a day (QID) | ORAL | 0 refills | Status: DC | PRN
Start: 2016-11-03 — End: 2016-11-06

## 2016-11-03 MED FILL — traMADol HCL 50 MG TABS: 50 | 15 days supply | Qty: 60 | Fill #0

## 2016-11-03 MED FILL — ALPRAZolam 1 MG TABS: 1 | 30 days supply | Qty: 30 | Fill #0

## 2016-11-03 NOTE — Telephone Encounter (Signed)
Called patient; notified of Tramadol refill called in to the [facility].

## 2016-11-05 ENCOUNTER — Telehealth: Payer: Self-pay | Admitting: *Deleted

## 2016-11-05 MED FILL — ONDANSETRON HCL 8 MG TABLET: 8 | 10 days supply | Qty: 30 | Fill #1

## 2016-11-05 NOTE — Telephone Encounter (Signed)
Pt called to this RN stating multiple symptoms still ongoing including severe fatigue - " sleeping majority of the day ", malaise, concentration disturbances, " and just feeling overwhelmed "   " found a new knot on the back of my head and that is worrying me "  " I am still very congested even though I have been taking the muccinex DM for 2 weeks "   Pt is currently s/p brain irradiation and on a slow taper to d/c decadron due to ongoing symptoms.  She is scheduled for restaging studies next week.   Per discussion pt will come in to [facility] tomorrow due to need to arrange transportation for review of symptoms and possible need for medication changes or other interventions to improve her well being.

## 2016-11-06 ENCOUNTER — Ambulatory Visit (HOSPITAL_BASED_OUTPATIENT_CLINIC_OR_DEPARTMENT_OTHER): Payer: Medicaid Other

## 2016-11-06 ENCOUNTER — Encounter: Payer: Self-pay | Admitting: Nurse Practitioner

## 2016-11-06 ENCOUNTER — Ambulatory Visit (HOSPITAL_BASED_OUTPATIENT_CLINIC_OR_DEPARTMENT_OTHER): Payer: Medicaid Other | Admitting: Nurse Practitioner

## 2016-11-06 ENCOUNTER — Other Ambulatory Visit: Payer: Self-pay | Admitting: Oncology

## 2016-11-06 VITALS — BP 118/64 | HR 88 | Temp 97.9°F | Resp 17 | Ht 65.0 in | Wt 164.3 lb

## 2016-11-06 DIAGNOSIS — C50312 Malignant neoplasm of lower-inner quadrant of left female breast: Secondary | ICD-10-CM

## 2016-11-06 DIAGNOSIS — C7931 Secondary malignant neoplasm of brain: Secondary | ICD-10-CM

## 2016-11-06 DIAGNOSIS — C7951 Secondary malignant neoplasm of bone: Secondary | ICD-10-CM | POA: Diagnosis not present

## 2016-11-06 DIAGNOSIS — H6690 Otitis media, unspecified, unspecified ear: Secondary | ICD-10-CM

## 2016-11-06 DIAGNOSIS — H6502 Acute serous otitis media, left ear: Secondary | ICD-10-CM

## 2016-11-06 LAB — CBC WITH DIFFERENTIAL/PLATELET
BASO%: 0.3 % (ref 0.0–2.0)
Basophils Absolute: 0 10*3/uL (ref 0.0–0.1)
EOS%: 0.5 % (ref 0.0–7.0)
Eosinophils Absolute: 0 10*3/uL (ref 0.0–0.5)
HCT: 39.8 % (ref 34.8–46.6)
HGB: 13.2 g/dL (ref 11.6–15.9)
LYMPH%: 7 % — ABNORMAL LOW (ref 14.0–49.7)
MCH: 33.1 pg (ref 25.1–34.0)
MCHC: 33.2 g/dL (ref 31.5–36.0)
MCV: 99.6 fL (ref 79.5–101.0)
MONO#: 0.4 10*3/uL (ref 0.1–0.9)
MONO%: 4.7 % (ref 0.0–14.0)
NEUT#: 8.2 10*3/uL — ABNORMAL HIGH (ref 1.5–6.5)
NEUT%: 87.5 % — ABNORMAL HIGH (ref 38.4–76.8)
Platelets: 215 10*3/uL (ref 145–400)
RBC: 4 10*6/uL (ref 3.70–5.45)
RDW: 14 % (ref 11.2–14.5)
WBC: 9.4 10*3/uL (ref 3.9–10.3)
lymph#: 0.7 10*3/uL — ABNORMAL LOW (ref 0.9–3.3)

## 2016-11-06 LAB — COMPREHENSIVE METABOLIC PANEL
ALT: 19 U/L (ref 0–55)
AST: 15 U/L (ref 5–34)
Albumin: 4 g/dL (ref 3.5–5.0)
Alkaline Phosphatase: 44 U/L (ref 40–150)
Anion Gap: 10 mEq/L (ref 3–11)
BUN: 11.7 mg/dL (ref 7.0–26.0)
CO2: 25 mEq/L (ref 22–29)
Calcium: 9.8 mg/dL (ref 8.4–10.4)
Chloride: 104 mEq/L (ref 98–109)
Creatinine: 0.9 mg/dL (ref 0.6–1.1)
EGFR: 69 mL/min/{1.73_m2} — ABNORMAL LOW (ref >90)
Glucose: 113 mg/dl (ref 70–140)
Potassium: 3.7 mEq/L (ref 3.5–5.1)
Sodium: 139 mEq/L (ref 136–145)
Total Bilirubin: 0.39 mg/dL (ref 0.20–1.20)
Total Protein: 6.9 g/dL (ref 6.4–8.3)

## 2016-11-06 MED ORDER — TRAMADOL HCL 50 MG PO TABS: 50.0000 mg | ORAL_TABLET | Freq: Four times a day (QID) | ORAL | 0 refills | Status: DC | PRN

## 2016-11-06 MED ORDER — AMOXICILLIN-POT CLAVULANATE 875-125 MG PO TABS: 1.0000 | ORAL_TABLET | Freq: Two times a day (BID) | ORAL | 0 refills | Status: DC

## 2016-11-06 MED FILL — AMOX TR-K CLV 875-125 MG TA: 875-125 | 10 days supply | Qty: 20 | Fill #0

## 2016-11-06 NOTE — Assessment & Plan Note (Addendum)
Patient is status post chemotherapy.  She completed whole brain radiation in November 2017.  She is currently receiving Herceptin/Perjeta on an every three-week basis.  Patient also continues to take anastrozole as directed.  Patient presented to the [facility] today with left ear fullness.  See further notes for details.  She also request a refill of her tramadol today as well.  Patient is scheduled to return for restaging CT and bone scan on 11/10/2016.  She is scheduled for labs and her next cycle of Herceptin/Perjeta on 11/17/2016.  She is scheduled for restaging brain MRI on 12/10/2016.

## 2016-11-06 NOTE — Progress Notes (Signed)
SYMPTOM MANAGEMENT CLINIC    Chief Complaint: Otitis media  HPI:  [name] 55 y.o. female diagnosed with breast cancer with both bone and brain metastasis.  Patient is status post chemotherapy and whole brain irradiation.  Currently undergoing Herceptin/Perjeta infusions.    No history exists.    Review of Systems  Constitutional: Positive for malaise/fatigue.  HENT: Positive for ear pain.   Skin:       Concerned with lump to the left posterior scalp.  Neurological: Positive for tremors.  All other systems reviewed and are negative.   Past Medical History:  Diagnosis Date  . Alcohol abuse   . Anemia    during chemo  . Anxiety    At age 15  . Arthritis Dx 2010  . Bipolar disorder ([place])   . Cancer ([place])    breast mets to brain  . Chronic pain   . Complication of anesthesia   . Depression   . Fibromyalgia Dx 2005  . GERD (gastroesophageal reflux disease)   . Headache    hx  migraines  . Opiate dependence ([place])   . PONV (postoperative nausea and vomiting)   . Port-a-cath in place   . PTSD (post-traumatic stress disorder)     Past Surgical History:  Procedure Laterality Date  . APPLICATION OF CRANIAL NAVIGATION N/A 08/14/2016   Procedure: APPLICATION OF CRANIAL NAVIGATION;  Surgeon: Erline Levine, MD;  Location: [facility];  Service: Neurosurgery;  Laterality: N/A;  . BREAST RECONSTRUCTION Left    with silicone implant  . CRANIOTOMY N/A 08/14/2016   Procedure: CRANIOTOMY TUMOR EXCISION WITH Lucky Rathke;  Surgeon: Erline Levine, MD;  Location: [facility];  Service: Neurosurgery;  Laterality: N/A;  . FIBULA FRACTURE SURGERY Left   . MASTECTOMY Left   . RADIOLOGY WITH ANESTHESIA N/A 07/23/2016   Procedure: MRI OF BRAIN WITH AND WITHOUT;  Surgeon: Medication Radiologist, MD;  Location: [facility];  Service: Radiology;  Laterality: N/A;  . RADIOLOGY WITH ANESTHESIA N/A 09/08/2016   Procedure: MRI OF BRAIN WITH AND WITHOUT CONTRAST;  Surgeon: Medication Radiologist, MD;   Location: [facility];  Service: Radiology;  Laterality: N/A;  . right power port placement Right     has H/O alcohol abuse; Anxiety state; Fibromyalgia; Family history of diabetes mellitus; Pap smear for cervical cancer screening; Current smoker; Healthcare maintenance; Seasonal allergies; Primary cancer of lower-inner quadrant of left female breast ([place]); Bone metastases ([place]); Iron deficiency anemia; Brain metastasis ([place]); and Otitis media on her problem list.    is allergic to demerol and erythromycin.    Medication List       Accurate as of 11/06/16 11:19 AM. Always use your most recent med list.          ALPRAZolam 1 MG tablet Commonly known as:  XANAX Take 1 tablet (1 mg total) by mouth as directed. Take xanax 48m tab oral 30 minutes before radaition treatments and every hour sleep,dispense 60 tabs no refill   amoxicillin-clavulanate 875-125 MG tablet Commonly known as:  AUGMENTIN Take 1 tablet by mouth 2 (two) times daily.   anastrozole 1 MG tablet Commonly known as:  ARIMIDEX Take 1 tablet (1 mg total) by mouth daily.   BIOTIN PO Take 1 capsule by mouth daily.   cetirizine 10 MG tablet Commonly known as:  ZYRTEC Take 1 tablet (10 mg total) by mouth daily.   cholestyramine 4 g packet Commonly known as:  QUESTRAN Take 1 packet (4 g total) by mouth 3 (three)  times daily with meals.   dexamethasone 2 MG tablet Commonly known as:  DECADRON Take 1 tablet (2 mg total) by mouth daily.   diphenoxylate-atropine 2.5-0.025 MG tablet Commonly known as:  LOMOTIL Take 2 tablets by mouth 4 (four) times daily as needed for diarrhea or loose stools.   fluticasone 50 MCG/ACT nasal spray Commonly known as:  FLONASE Place 2 sprays into both nostrils daily.   gabapentin 300 MG capsule Commonly known as:  NEURONTIN Take 2 capsules (600 mg total) by mouth 3 (three) times daily. 300 mg at night for one day, twice daily for one day, then three times daily   ibuprofen 200 MG  tablet Commonly known as:  ADVIL,MOTRIN Take 800 mg by mouth every 6 (six) hours as needed for mild pain.   IMODIUM PO Take 1 tablet by mouth as needed (diarrhea).   lamoTRIgine 100 MG tablet Commonly known as:  LAMICTAL Take 1.5 tablets (150 mg total) by mouth 2 (two) times daily.   lidocaine-prilocaine cream Commonly known as:  EMLA Apply 1 application topically as needed.   lithium carbonate 300 MG capsule Take 1 capsule (300 mg total) by mouth 2 (two) times daily with a meal.   LORazepam 0.5 MG tablet Commonly known as:  ATIVAN Take 1 tablet (0.5 mg total) by mouth every 8 (eight) hours as needed for anxiety.   MULTIPLE VITAMIN PO Take by mouth every morning.   ondansetron 8 MG tablet Commonly known as:  ZOFRAN Take 1 tablet (8 mg total) by mouth every 8 (eight) hours as needed for nausea or vomiting.   pantoprazole 40 MG tablet Commonly known as:  PROTONIX TAKE 1 TABLET BY MOUTH ONCE DAILY   potassium chloride 10 MEQ tablet Commonly known as:  K-DUR Take 1 tablet (10 mEq total) by mouth daily.   pseudoephedrine-guaifenesin 60-600 MG 12 hr tablet Commonly known as:  MUCINEX D Take 1 tablet by mouth 2 (two) times daily as needed for congestion. Take 1 tab bid x 1-2 weeks prn congestion, 1 bottle 18 tabs, no refill   SONAFINE Apply 1 application topically daily. Apply to head after rad txs whern skin becomes itchy or irritated,   tiZANidine 4 MG tablet Commonly known as:  ZANAFLEX Take 1 tablet (4 mg total) by mouth daily.   traMADol 50 MG tablet Commonly known as:  ULTRAM Take 1 tablet (50 mg total) by mouth every 6 (six) hours as needed.   trazodone 300 MG tablet Commonly known as:  DESYREL Take 1 tablet (300 mg total) by mouth at bedtime.   varenicline 0.5 MG tablet Commonly known as:  CHANTIX Take 1 tablet (0.5 mg total) by mouth daily.   Vitamin D (Cholecalciferol) 1000 units Caps Take 1,000 Units by mouth daily.        PHYSICAL  EXAMINATION  Oncology Vitals 11/06/2016 10/27/2016  Height 165 cm 165 cm  Weight 74.526 kg 73.936 kg  Weight (lbs) 164 lbs 5 oz 163 lbs  BMI (kg/m2) 27.34 kg/m2 27.12 kg/m2  Temp 97.9 97.9  Pulse 88 88  Resp 17 18  SpO2 96 96  BSA (m2) 1.85 m2 1.84 m2   BP Readings from Last 2 Encounters:  11/06/16 118/64  10/27/16 115/73    Physical Exam  Constitutional: She is oriented to person, place, and time and well-developed, well-nourished, and in no distress.  HENT:  Head: Normocephalic and atraumatic.  Mouth/Throat: Oropharynx is clear and moist.  Exam today reveals lungs clear bilaterally with no cough  or wheeze.  Right ear with significant cerumen, but still able to visualize tympanic.  Left ear with significant erythema and effusion.  However, TM appears intact.  Palpation of the scalp revealed no obvious mass; but most likely just the regular bony skull.         Eyes: Conjunctivae and EOM are normal. Pupils are equal, round, and reactive to light. Right eye exhibits no discharge. Left eye exhibits no discharge. No scleral icterus.  Neck: Normal range of motion. Neck supple. No JVD present. No tracheal deviation present. No thyromegaly present.  Cardiovascular: Normal rate, regular rhythm, normal heart sounds and intact distal pulses.   Pulmonary/Chest: Effort normal and breath sounds normal. No respiratory distress. She has no wheezes. She has no rales. She exhibits no tenderness.  Abdominal: Soft. Bowel sounds are normal. She exhibits no distension and no mass. There is no tenderness. There is no rebound and no guarding.  Musculoskeletal: Normal range of motion. She exhibits no edema or tenderness.  Lymphadenopathy:    She has no cervical adenopathy.  Neurological: She is alert and oriented to person, place, and time. Gait normal.  Skin: Skin is warm and dry. No rash noted. No erythema. No pallor.  Psychiatric: Affect normal.  Nursing note and vitals  reviewed.   LABORATORY DATA:. Appointment on 11/06/2016  Component Date Value Ref Range Status  . WBC 11/06/2016 9.4  3.9 - 10.3 10e3/uL Final  . NEUT# 11/06/2016 8.2* 1.5 - 6.5 10e3/uL Final  . HGB 11/06/2016 13.2  11.6 - 15.9 g/dL Final  . HCT 11/06/2016 39.8  34.8 - 46.6 % Final  . Platelets 11/06/2016 215  145 - 400 10e3/uL Final  . MCV 11/06/2016 99.6  79.5 - 101.0 fL Final  . MCH 11/06/2016 33.1  25.1 - 34.0 pg Final  . MCHC 11/06/2016 33.2  31.5 - 36.0 g/dL Final  . RBC 11/06/2016 4.00  3.70 - 5.45 10e6/uL Final  . RDW 11/06/2016 14.0  11.2 - 14.5 % Final  . lymph# 11/06/2016 0.7* 0.9 - 3.3 10e3/uL Final  . MONO# 11/06/2016 0.4  0.1 - 0.9 10e3/uL Final  . Eosinophils Absolute 11/06/2016 0.0  0.0 - 0.5 10e3/uL Final  . Basophils Absolute 11/06/2016 0.0  0.0 - 0.1 10e3/uL Final  . NEUT% 11/06/2016 87.5* 38.4 - 76.8 % Final  . LYMPH% 11/06/2016 7.0* 14.0 - 49.7 % Final  . MONO% 11/06/2016 4.7  0.0 - 14.0 % Final  . EOS% 11/06/2016 0.5  0.0 - 7.0 % Final  . BASO% 11/06/2016 0.3  0.0 - 2.0 % Final  . Sodium 11/06/2016 139  136 - 145 mEq/L Final  . Potassium 11/06/2016 3.7  3.5 - 5.1 mEq/L Final  . Chloride 11/06/2016 104  98 - 109 mEq/L Final  . CO2 11/06/2016 25  22 - 29 mEq/L Final  . Glucose 11/06/2016 113  70 - 140 mg/dl Final  . BUN 11/06/2016 11.7  7.0 - 26.0 mg/dL Final  . Creatinine 11/06/2016 0.9  0.6 - 1.1 mg/dL Final  . Total Bilirubin 11/06/2016 0.39  0.20 - 1.20 mg/dL Final  . Alkaline Phosphatase 11/06/2016 44  40 - 150 U/L Final  . AST 11/06/2016 15  5 - 34 U/L Final  . ALT 11/06/2016 19  0 - 55 U/L Final  . Total Protein 11/06/2016 6.9  6.4 - 8.3 g/dL Final  . Albumin 11/06/2016 4.0  3.5 - 5.0 g/dL Final  . Calcium 11/06/2016 9.8  8.4 - 10.4 mg/dL Final  .  Anion Gap 11/06/2016 10  3 - 11 mEq/L Final  . EGFR 11/06/2016 69* >90 ml/min/1.73 m2 Final    RADIOGRAPHIC STUDIES: No results found.  ASSESSMENT/PLAN:    Primary cancer of lower-inner quadrant of  left female breast [facility]) Patient is status post chemotherapy.  She completed whole brain radiation in November 2017.  She is currently receiving Herceptin/Perjeta on an every three-week basis.  Patient also continues to take anastrozole as directed.  Patient presented to the [facility] today with left ear fullness.  See further notes for details.  She also request a refill of her tramadol today as well.  Patient is scheduled to return for restaging CT and bone scan on 11/10/2016.  She is scheduled for labs and her next cycle of Herceptin/Perjeta on 11/17/2016.  She is scheduled for restaging brain MRI on 12/10/2016.  Otitis media Patient has been diagnosed with breast cancer with metastasis to both the bone and brain.  She is status post chemotherapy in the past; and completed whole brain irradiation in November 2017.  She continues to receive Herceptin/Perjeta on an every three-week basis.  She presented to the [facility] today with a two-week history of bilateral ear fullness, sinus drainage, and some sinus pressure.  She also states that she feels shaky all over and very fatigued.  She denies any recent fevers or chills.    She has also noticed a knot to the left back side of her head that is concerning for her as well.  Exam today reveals lungs clear bilaterally with no cough or wheeze.  Right ear with significant cerumen, but still able to visualize tympanic.  Left ear with significant erythema and effusion.  However, TM appears intact.  Palpation of the scalp revealed no obvious mass; but most likely just the regular bony skull.  Patient does have some trace shakiness of her hands as baseline.  Patient neurologically intact otherwise.  Patient continues to take lithium as previously directed.  The lithium level has not resulted today yet.  Patient states that Dr. [name] has requested that she obtain a primary care physician to manage her lithium level in the future.  Patient  stated that she would call and arrange an appointment with her psychiatrist or her primary care provider later today.  Patient will be treated with Augmentin antibiotics for treatment of significant left ear otitis media.  She will also continue to take the Zyrtec, Flonase, and Mucinex DM as previously directed.  She also continues to take dexamethasone 2 mg on a daily basis for treatment of her brain metastasis.  Patient was advised to call/return or go directly to the emergency department for any worsening symptoms whatsoever.  Also, advised patient could arrange for her brain MRI to be moved up on a stat basis if needed secondary to increasing neurological symptoms.   Patient stated understanding of all instructions; and was in agreement with this plan of care. The patient knows to call the clinic with any problems, questions or concerns.   Total time spent with patient was 25 minutes;  with greater than 75 percent of that time spent in face to face counseling regarding patient's symptoms,  and coordination of care and follow up.  Disclaimer:This dictation was prepared with Dragon/digital dictation along with [employer]. Any transcriptional errors that result from this process are unintentional.  Drue Second, NP 11/06/2016

## 2016-11-06 NOTE — Assessment & Plan Note (Signed)
Patient has been diagnosed with breast cancer with metastasis to both the bone and brain.  She is status post chemotherapy in the past; and completed whole brain irradiation in November 2017.  She continues to receive Herceptin/Perjeta on an every three-week basis.  She presented to the [facility] today with a two-week history of bilateral ear fullness, sinus drainage, and some sinus pressure.  She also states that she feels shaky all over and very fatigued.  She denies any recent fevers or chills.    She has also noticed a knot to the left back side of her head that is concerning for her as well.  Exam today reveals lungs clear bilaterally with no cough or wheeze.  Right ear with significant cerumen, but still able to visualize tympanic.  Left ear with significant erythema and effusion.  However, TM appears intact.  Palpation of the scalp revealed no obvious mass; but most likely just the regular bony skull.  Patient does have some trace shakiness of her hands as baseline.  Patient neurologically intact otherwise.  Patient continues to take lithium as previously directed.  The lithium level has not resulted today yet.  Patient states that Dr. [name] has requested that she obtain a primary care physician to manage her lithium level in the future.  Patient stated that she would call and arrange an appointment with her psychiatrist or her primary care provider later today.  Patient will be treated with Augmentin antibiotics for treatment of significant left ear otitis media.  She will also continue to take the Zyrtec, Flonase, and Mucinex DM as previously directed.  She also continues to take dexamethasone 2 mg on a daily basis for treatment of her brain metastasis.  Patient was advised to call/return or go directly to the emergency department for any worsening symptoms whatsoever.  Also, advised patient could arrange for her brain MRI to be moved up on a stat basis if needed secondary to  increasing neurological symptoms.

## 2016-11-07 LAB — LITHIUM LEVEL: Lithium: 1 mmol/L (ref 0.6–1.2)

## 2016-11-07 NOTE — Progress Notes (Signed)
  Radiation Oncology         [phone] ________________________________  Name: [name] MRN: [id number]  Date: 08/05/2016  DOB: 02-23-1961  DIAGNOSIS:     ICD-9-CM ICD-10-CM   1. Brain metastasis ([place]) 198.3 C79.31     NARRATIVE:  The patient was brought to the [facility].  Identity was confirmed.  All relevant records and images related to the planned course of therapy were reviewed.  The patient freely provided informed written consent to proceed with treatment after reviewing the details related to the planned course of therapy. The consent form was witnessed and verified by the simulation staff. Intravenous access was established for contrast administration. Then, the patient was set-up in a stable reproducible supine position for radiation therapy.  A relocatable thermoplastic stereotactic head frame was fabricated for precise immobilization.  CT images were obtained.  Surface markings were placed.  The CT images were loaded into the planning software and fused with the patient's targeting MRI scan.  Then the target and avoidance structures were contoured.  Treatment planning then occurred.  The radiation prescription was entered and confirmed.  I have requested 3D planning  I have requested a DVH of the following structures: Brain stem, brain, left eye, right eye, lenses, optic chiasm, target volumes, uninvolved brain, and normal tissue.    SPECIAL TREATMENT PROCEDURE:  The planned course of therapy using radiation constitutes a special treatment procedure. Special care is required in the management of this patient for the following reasons. This treatment constitutes a Special Treatment Procedure for the following reason: High dose per fraction requiring special monitoring for increased toxicities of treatment including daily imaging.  The special nature of the planned course of radiotherapy will require increased physician supervision and oversight to ensure patient's  safety with optimal treatment outcomes.  PLAN:  The patient will receive 14 Gy in 1 fraction.   ------------------------------------------------  Jodelle Gross, MD, PhD

## 2016-11-09 ENCOUNTER — Encounter: Payer: Self-pay | Admitting: *Deleted

## 2016-11-09 ENCOUNTER — Other Ambulatory Visit: Payer: Self-pay | Admitting: Oncology

## 2016-11-09 ENCOUNTER — Telehealth: Payer: Self-pay | Admitting: *Deleted

## 2016-11-09 NOTE — Telephone Encounter (Signed)
TCT patient to follow up on visit in [facility] on 11/06/16. Pt had ear infection.  No answer. VM left for patient to call back if any questions or concerns.

## 2016-11-10 ENCOUNTER — Ambulatory Visit (HOSPITAL_COMMUNITY)
Admission: RE | Admit: 2016-11-10 | Discharge: 2016-11-10 | Disposition: A | Discharge status: Home / Self Care | Payer: Medicaid Other | Source: Ambulatory Visit | Attending: Oncology | Admitting: Oncology

## 2016-11-10 ENCOUNTER — Encounter (HOSPITAL_COMMUNITY)
Admission: RE | Admit: 2016-11-10 | Discharge: 2016-11-10 | Disposition: A | Discharge status: Home / Self Care | Payer: Medicaid Other | Source: Ambulatory Visit | Attending: Oncology | Admitting: Oncology

## 2016-11-10 ENCOUNTER — Encounter (HOSPITAL_COMMUNITY): Payer: Self-pay

## 2016-11-10 DIAGNOSIS — C7951 Secondary malignant neoplasm of bone: Secondary | ICD-10-CM | POA: Insufficient documentation

## 2016-11-10 DIAGNOSIS — C50312 Malignant neoplasm of lower-inner quadrant of left female breast: Secondary | ICD-10-CM | POA: Insufficient documentation

## 2016-11-10 DIAGNOSIS — C7931 Secondary malignant neoplasm of brain: Secondary | ICD-10-CM

## 2016-11-10 DIAGNOSIS — Z9889 Other specified postprocedural states: Secondary | ICD-10-CM | POA: Diagnosis not present

## 2016-11-10 MED ORDER — TECHNETIUM TC 99M MEDRONATE IV KIT
21.7000 | PACK | Freq: Once | INTRAVENOUS | Status: AC | PRN
  Administered 2016-11-10: 21.7 via INTRAVENOUS

## 2016-11-10 MED ORDER — IOPAMIDOL (ISOVUE-300) INJECTION 61%
75.0000 mL | Freq: Once | INTRAVENOUS | Status: AC | PRN
  Administered 2016-11-10: 75 mL via INTRAVENOUS

## 2016-11-10 MED ORDER — SODIUM CHLORIDE 0.9 % IJ SOLN
INTRAMUSCULAR | Status: AC
Start: 2016-11-10 — End: 2016-11-10
  Filled 2016-11-10: qty 50

## 2016-11-10 MED ORDER — IOPAMIDOL (ISOVUE-300) INJECTION 61%
INTRAVENOUS | Status: AC
  Filled 2016-11-10: qty 75

## 2016-11-11 ENCOUNTER — Encounter: Payer: Self-pay | Admitting: Oncology

## 2016-11-11 ENCOUNTER — Other Ambulatory Visit: Payer: Self-pay | Admitting: Oncology

## 2016-11-11 ENCOUNTER — Other Ambulatory Visit: Payer: Self-pay | Admitting: Emergency Medicine

## 2016-11-11 ENCOUNTER — Other Ambulatory Visit (HOSPITAL_COMMUNITY): Payer: Self-pay | Admitting: Internal Medicine

## 2016-11-11 ENCOUNTER — Ambulatory Visit (HOSPITAL_COMMUNITY)
Admission: RE | Admit: 2016-11-11 | Discharge: 2016-11-11 | Disposition: A | Discharge status: Home / Self Care | Payer: Medicaid Other | Source: Ambulatory Visit | Attending: Internal Medicine | Admitting: Internal Medicine

## 2016-11-11 ENCOUNTER — Other Ambulatory Visit: Payer: Self-pay | Admitting: *Deleted

## 2016-11-11 ENCOUNTER — Ambulatory Visit (HOSPITAL_BASED_OUTPATIENT_CLINIC_OR_DEPARTMENT_OTHER)
Admission: RE | Admit: 2016-11-11 | Discharge: 2016-11-11 | Disposition: A | Discharge status: Home / Self Care | Payer: Medicaid Other | Source: Ambulatory Visit | Attending: Internal Medicine | Admitting: Internal Medicine

## 2016-11-11 VITALS — BP 128/80 | HR 91 | Wt 165.5 lb

## 2016-11-11 DIAGNOSIS — C50912 Malignant neoplasm of unspecified site of left female breast: Secondary | ICD-10-CM | POA: Diagnosis not present

## 2016-11-11 DIAGNOSIS — C7951 Secondary malignant neoplasm of bone: Secondary | ICD-10-CM | POA: Insufficient documentation

## 2016-11-11 DIAGNOSIS — Z9012 Acquired absence of left breast and nipple: Secondary | ICD-10-CM | POA: Diagnosis not present

## 2016-11-11 DIAGNOSIS — F4310 Post-traumatic stress disorder, unspecified: Secondary | ICD-10-CM | POA: Insufficient documentation

## 2016-11-11 DIAGNOSIS — Z79899 Other long term (current) drug therapy: Secondary | ICD-10-CM | POA: Insufficient documentation

## 2016-11-11 DIAGNOSIS — G8929 Other chronic pain: Secondary | ICD-10-CM | POA: Insufficient documentation

## 2016-11-11 DIAGNOSIS — Z888 Allergy status to other drugs, medicaments and biological substances status: Secondary | ICD-10-CM | POA: Insufficient documentation

## 2016-11-11 DIAGNOSIS — Z833 Family history of diabetes mellitus: Secondary | ICD-10-CM | POA: Diagnosis not present

## 2016-11-11 DIAGNOSIS — F17210 Nicotine dependence, cigarettes, uncomplicated: Secondary | ICD-10-CM | POA: Diagnosis not present

## 2016-11-11 DIAGNOSIS — Z8249 Family history of ischemic heart disease and other diseases of the circulatory system: Secondary | ICD-10-CM | POA: Insufficient documentation

## 2016-11-11 DIAGNOSIS — M797 Fibromyalgia: Secondary | ICD-10-CM

## 2016-11-11 DIAGNOSIS — C7931 Secondary malignant neoplasm of brain: Secondary | ICD-10-CM | POA: Insufficient documentation

## 2016-11-11 DIAGNOSIS — F319 Bipolar disorder, unspecified: Secondary | ICD-10-CM | POA: Insufficient documentation

## 2016-11-11 DIAGNOSIS — C50312 Malignant neoplasm of lower-inner quadrant of left female breast: Secondary | ICD-10-CM | POA: Diagnosis not present

## 2016-11-11 DIAGNOSIS — G894 Chronic pain syndrome: Secondary | ICD-10-CM

## 2016-11-11 DIAGNOSIS — Z818 Family history of other mental and behavioral disorders: Secondary | ICD-10-CM | POA: Diagnosis not present

## 2016-11-11 DIAGNOSIS — F419 Anxiety disorder, unspecified: Secondary | ICD-10-CM | POA: Insufficient documentation

## 2016-11-11 DIAGNOSIS — K219 Gastro-esophageal reflux disease without esophagitis: Secondary | ICD-10-CM | POA: Insufficient documentation

## 2016-11-11 MED ORDER — GABAPENTIN 300 MG PO CAPS: 600.0000 mg | ORAL_CAPSULE | Freq: Three times a day (TID) | ORAL | 2 refills | Status: DC

## 2016-11-11 MED ORDER — LAMOTRIGINE 100 MG PO TABS: 150.0000 mg | ORAL_TABLET | Freq: Two times a day (BID) | ORAL | 1 refills | Status: DC

## 2016-11-11 MED ORDER — LITHIUM CARBONATE 300 MG PO CAPS: 300.0000 mg | ORAL_CAPSULE | Freq: Two times a day (BID) | ORAL | 3 refills | Status: DC

## 2016-11-11 MED ORDER — VARENICLINE TARTRATE 0.5 MG PO TABS: 0.5000 mg | ORAL_TABLET | Freq: Every day | ORAL | 3 refills | Status: DC

## 2016-11-11 MED FILL — LITHIUM CARBONATE 300 MG CA: 300 | 30 days supply | Qty: 60 | Fill #0 | Status: TO

## 2016-11-11 MED FILL — CHANTIX 0.5 MG TABLET: 0.5 | 30 days supply | Qty: 30 | Fill #3 | Status: TO

## 2016-11-11 MED FILL — GABAPENTIN 300 MG CAPSULE: 300 | 30 days supply | Qty: 180 | Fill #0 | Status: TO

## 2016-11-11 MED FILL — DIPHENOXYLATE/ATROPINE TAB: 2.5-0.025 | 3 days supply | Qty: 40 | Fill #0

## 2016-11-11 MED FILL — lamoTRIgine 100 MG TABS: 100 | 20 days supply | Qty: 60 | Fill #0 | Status: TO

## 2016-11-11 NOTE — Progress Notes (Signed)
  Echocardiogram 2D Echocardiogram has been performed.  Donata Clay 11/11/2016, 1:59 PM

## 2016-11-11 NOTE — Patient Instructions (Signed)
Echo and Follow up in 3 months

## 2016-11-11 NOTE — Progress Notes (Signed)
Patient ID: [name], female   DOB: March 03, 1961, 55 y.o.   MRN: [id number]   CARDIO-ONCOLOGY CLINIC CONSULT NOTE  Referring Physician: Magrinat   HPI:  [name] is a 55 y/o woman with left breast CA (diagnosed 7/16) referred by Dr. [name] for enrollment into the [facility].   Biopsy of the left breast mass 06/19/2015 showed (SP [id number]) an invasive ductal carcinoma, grade 2, estrogen receptor 83% positive, progesterone receptor 26% positive, and HER-2 amplified by immunohistochemstry with a 3+ reading.   Underwent left mastectomy with left axillary sentinel lymph node sampling, which, since the lymph nodes were positive, extended to the procedure to left axillary lymph node dissection. The pathology (SP [id number]) showed an invasive ductal carcinoma, grade 3, measuring in excess of 9 cm. There were also skin satellites, not contiguous with the invasive carcinoma. . A total of 15 lymph nodes were removed, including 5 sentinel lymph nodes, all of which were positive, so that the final total was 14 out of 15 lymph nodes involved by tumor. There was evidence of extranodal extension. The final pathology was pT4b pN2a, stage IIIB   Unfortunately CT scans of the chest abdomen and pelvis 09/16/2015 showed bony metastases to the right scapula, left iliac crest, and also L4 and T-spine. There were questionable liver cysts which on repeat CT scan 03/02/2016 appear to be a little bit more well-defined, possibly a little larger. There were also some possible right upper lobe lung lesions.  Adjuvant treatment consisted of docetaxel, trastuzumab and pertuzumab (started 11/16), with the final (6th) docetaxel dose given 02/11/2016. She continues on trastuzumab and pertuzumab, with the 11th cycle given 05/05/2016. Echocardiogram 02/26/2016 showed an ejection fraction of 55%. She receives denosumab/Xgeva every 6 weeks.. Finished radiation 06/26/2016.   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 Underwent resection and XRT. Continues with herceptin/Perjeta. Tolerating well. No exertional dyspnea. No edema, orthopnea, PND.  Echo 60-65% lateral s' 10.8 cm/s GLS -19.8%  Echo 60-65% lateral s' 9.2cm/s  GLS -15.6 (underestimated due to poor endocardial tracking)   Past Medical History:  Diagnosis Date  . Alcohol abuse   . Anemia    during chemo  . Anxiety    At age 16  . Arthritis Dx 2010  . Bipolar disorder ([place])   . Cancer ([place])    breast mets to brain  . Chronic pain   . Complication of anesthesia   . Depression   . Fibromyalgia Dx 2005  . GERD (gastroesophageal reflux disease)   . Headache    hx  migraines  . Opiate dependence ([place])   . PONV (postoperative nausea and vomiting)   . Port-a-cath in place   . PTSD (post-traumatic stress disorder)     Current Outpatient Prescriptions  Medication Sig Dispense Refill  . ALPRAZolam (XANAX) 1 MG tablet Take 1 tablet (1 mg total) by mouth as directed. Take xanax 40m tab oral 30 minutes before radaition treatments and every hour sleep,dispense 60 tabs no refill 30 tablet 0  . amoxicillin-clavulanate (AUGMENTIN) 875-125 MG tablet Take 1 tablet by mouth 2 (two) times daily. 20 tablet 0  . anastrozole (ARIMIDEX) 1 MG tablet Take 1 tablet (1 mg total) by mouth daily. 90 tablet 1  . BIOTIN PO Take 1 capsule by mouth daily.    . cetirizine (ZYRTEC) 10 MG tablet Take 1 tablet (10 mg total) by mouth daily. 30 tablet 11  . cholestyramine (QUESTRAN) 4 g packet Take 1 packet (4 g total) by mouth  3 (three) times daily with meals. 60 each 12  . dexamethasone (DECADRON) 2 MG tablet Take 1 tablet (2 mg total) by mouth daily. 30 tablet 0  . diphenoxylate-atropine (LOMOTIL) 2.5-0.025 MG tablet Take 2 tablets by mouth 4 (four) times daily as needed for diarrhea or loose stools. 40 tablet 0  . fluticasone (FLONASE) 50 MCG/ACT nasal spray Place 2 sprays into both nostrils daily. 15.8 g 3  . gabapentin (NEURONTIN) 300 MG capsule Take 2  capsules (600 mg total) by mouth 3 (three) times daily. 300 mg at night for one day, twice daily for one day, then three times daily 180 capsule 2  . ibuprofen (ADVIL,MOTRIN) 200 MG tablet Take 800 mg by mouth every 6 (six) hours as needed for mild pain.    [name] lamoTRIgine (LAMICTAL) 100 MG tablet Take 1.5 tablets (150 mg total) by mouth 2 (two) times daily. 60 tablet 1  . lidocaine-prilocaine (EMLA) cream Apply 1 application topically as needed. 30 g 0  . lithium carbonate 300 MG capsule Take 1 capsule (300 mg total) by mouth 2 (two) times daily with a meal. 60 capsule 3  . Loperamide HCl (IMODIUM PO) Take 1 tablet by mouth as needed (diarrhea).     . LORazepam (ATIVAN) 0.5 MG tablet Take 1 tablet (0.5 mg total) by mouth every 8 (eight) hours as needed for anxiety. 30 tablet 1  . MULTIPLE VITAMIN PO Take by mouth every morning.    . ondansetron (ZOFRAN) 8 MG tablet Take 1 tablet (8 mg total) by mouth every 8 (eight) hours as needed for nausea or vomiting. 30 tablet 1  . pantoprazole (PROTONIX) 40 MG tablet TAKE 1 TABLET BY MOUTH ONCE DAILY 30 tablet 1  . potassium chloride (K-DUR) 10 MEQ tablet Take 1 tablet (10 mEq total) by mouth daily. 30 tablet 3  . pseudoephedrine-guaifenesin (MUCINEX D) 60-600 MG 12 hr tablet Take 1 tablet by mouth 2 (two) times daily as needed for congestion. Take 1 tab bid x 1-2 weeks prn congestion, 1 bottle 18 tabs, no refill 30 tablet 1  . tiZANidine (ZANAFLEX) 4 MG tablet Take 1 tablet (4 mg total) by mouth daily. 30 tablet 0  . traMADol (ULTRAM) 50 MG tablet Take 1 tablet (50 mg total) by mouth every 6 (six) hours as needed. 60 tablet 0  . trazodone (DESYREL) 300 MG tablet Take 1 tablet (300 mg total) by mouth at bedtime. 30 tablet 3  . varenicline (CHANTIX) 0.5 MG tablet Take 1 tablet (0.5 mg total) by mouth daily. 30 tablet 3  . Vitamin D, Cholecalciferol, 1000 units CAPS Take 1,000 Units by mouth daily.    . Wound Dressings (SONAFINE) Apply 1 application topically  daily. Apply to head after rad txs whern skin becomes itchy or irritated,     No current facility-administered medications for this encounter.     Allergies  Allergen Reactions  . Demerol Itching and Nausea And Vomiting  . Erythromycin Rash    Can take zpak      Social History   Social History  . Marital status: Single    Spouse name: N/A  . Number of children: N/A  . Years of education: N/A   Occupational History  . Not on file.   Social History Main Topics  . Smoking status: Current Every Day Smoker    Packs/day: 0.50    Types: Cigarettes  . Smokeless tobacco: Never Used     Comment: Pt is on Chantix at present time  .  Alcohol use Yes     Comment: no ETOH since 08/22/12  . Drug use: No     Comment: prescription opiates from the street daily until Sunday  . Sexual activity: No     Comment: ablation   Other Topics Concern  . Not on file   Social History Narrative  . No narrative on file      Family History  Problem Relation Age of Onset  . Diabetes Mother   . Bipolar disorder Mother   . CAD Father     Vitals:   11/11/16 1414  BP: 128/80  Pulse: 91  SpO2: 92%  Weight: 165 lb 8 oz (75.1 kg)    PHYSICAL EXAM: General:  Well appearing. No respiratory difficulty HEENT: normal Neck: supple. no JVD. Carotids 2+ bilat; no bruits. No lymphadenopathy or thryomegaly appreciated. Cor: Erythema over left breast PMI nondisplaced. Regular rate & rhythm. No rubs, gallops or murmurs. Lungs: clear Abdomen: soft, nontender, nondistended. No hepatosplenomegaly. No bruits or masses. Good bowel sounds. Extremities: no cyanosis, clubbing, rash, edema Neuro: alert & oriented x 3, cranial nerves grossly intact. moves all 4 extremities w/o difficulty. Affect pleasant.  ASSESSMENT & PLAN: 1. 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 EF and Doppler parameters stable. No HF on exam. Continue Herceptin. Will repeat echo q6 months indefinitely.   Alyson Ki,MD 2:52 PM

## 2016-11-16 ENCOUNTER — Other Ambulatory Visit: Payer: Self-pay | Admitting: Oncology

## 2016-11-16 MED FILL — ANASTROZOLE 1 MG TABLET: 1 | 90 days supply | Qty: 90 | Fill #1

## 2016-11-17 ENCOUNTER — Ambulatory Visit (HOSPITAL_BASED_OUTPATIENT_CLINIC_OR_DEPARTMENT_OTHER): Payer: Medicaid Other

## 2016-11-17 ENCOUNTER — Encounter: Payer: Self-pay | Admitting: *Deleted

## 2016-11-17 ENCOUNTER — Other Ambulatory Visit (HOSPITAL_BASED_OUTPATIENT_CLINIC_OR_DEPARTMENT_OTHER): Payer: Medicaid Other

## 2016-11-17 VITALS — BP 117/71 | HR 88 | Temp 98.0°F | Resp 16

## 2016-11-17 DIAGNOSIS — C50312 Malignant neoplasm of lower-inner quadrant of left female breast: Secondary | ICD-10-CM | POA: Diagnosis present

## 2016-11-17 DIAGNOSIS — C7951 Secondary malignant neoplasm of bone: Secondary | ICD-10-CM | POA: Diagnosis not present

## 2016-11-17 DIAGNOSIS — Z5112 Encounter for antineoplastic immunotherapy: Secondary | ICD-10-CM | POA: Diagnosis present

## 2016-11-17 DIAGNOSIS — C7931 Secondary malignant neoplasm of brain: Secondary | ICD-10-CM

## 2016-11-17 DIAGNOSIS — C773 Secondary and unspecified malignant neoplasm of axilla and upper limb lymph nodes: Secondary | ICD-10-CM | POA: Diagnosis not present

## 2016-11-17 LAB — CBC WITH DIFFERENTIAL/PLATELET
BASO%: 0.3 % (ref 0.0–2.0)
Basophils Absolute: 0 10*3/uL (ref 0.0–0.1)
EOS%: 0.3 % (ref 0.0–7.0)
Eosinophils Absolute: 0 10*3/uL (ref 0.0–0.5)
HCT: 41.2 % (ref 34.8–46.6)
HGB: 13.5 g/dL (ref 11.6–15.9)
LYMPH%: 5.3 % — ABNORMAL LOW (ref 14.0–49.7)
MCH: 32.8 pg (ref 25.1–34.0)
MCHC: 32.7 g/dL (ref 31.5–36.0)
MCV: 100.3 fL (ref 79.5–101.0)
MONO#: 0.3 10*3/uL (ref 0.1–0.9)
MONO%: 2.7 % (ref 0.0–14.0)
NEUT#: 11 10*3/uL — ABNORMAL HIGH (ref 1.5–6.5)
NEUT%: 91.4 % — ABNORMAL HIGH (ref 38.4–76.8)
Platelets: 215 10*3/uL (ref 145–400)
RBC: 4.11 10*6/uL (ref 3.70–5.45)
RDW: 13.6 % (ref 11.2–14.5)
WBC: 12 10*3/uL — ABNORMAL HIGH (ref 3.9–10.3)
lymph#: 0.6 10*3/uL — ABNORMAL LOW (ref 0.9–3.3)

## 2016-11-17 LAB — COMPREHENSIVE METABOLIC PANEL
ALT: 24 U/L (ref 0–55)
AST: 17 U/L (ref 5–34)
Albumin: 4.2 g/dL (ref 3.5–5.0)
Alkaline Phosphatase: 46 U/L (ref 40–150)
Anion Gap: 8 mEq/L (ref 3–11)
BUN: 11.9 mg/dL (ref 7.0–26.0)
CO2: 27 mEq/L (ref 22–29)
Calcium: 10 mg/dL (ref 8.4–10.4)
Chloride: 102 mEq/L (ref 98–109)
Creatinine: 1 mg/dL (ref 0.6–1.1)
EGFR: 67 mL/min/{1.73_m2} — ABNORMAL LOW (ref >90)
Glucose: 115 mg/dl (ref 70–140)
Potassium: 4.3 mEq/L (ref 3.5–5.1)
Sodium: 138 mEq/L (ref 136–145)
Total Bilirubin: 0.39 mg/dL (ref 0.20–1.20)
Total Protein: 7.1 g/dL (ref 6.4–8.3)

## 2016-11-17 MED ORDER — SODIUM CHLORIDE 0.9 % IV SOLN
210.0000 mg | Freq: Once | INTRAVENOUS | Status: AC
  Administered 2016-11-17: 210 mg via INTRAVENOUS
  Filled 2016-11-17: qty 7

## 2016-11-17 MED ORDER — ACETAMINOPHEN 325 MG PO TABS
ORAL_TABLET | ORAL | Status: AC
  Filled 2016-11-17: qty 2

## 2016-11-17 MED ORDER — ACETAMINOPHEN 325 MG PO TABS: 650.0000 mg | ORAL_TABLET | Freq: Once | ORAL | Status: DC

## 2016-11-17 MED ORDER — DIPHENHYDRAMINE HCL 25 MG PO CAPS: 25.0000 mg | ORAL_CAPSULE | Freq: Once | ORAL | Status: DC

## 2016-11-17 MED ORDER — SODIUM CHLORIDE 0.9% FLUSH
10.0000 mL | INTRAVENOUS | Status: DC | PRN
  Administered 2016-11-17: 10 mL
  Filled 2016-11-17: qty 10

## 2016-11-17 MED ORDER — SODIUM CHLORIDE 0.9 % IV SOLN
Freq: Once | INTRAVENOUS | Status: AC
  Administered 2016-11-17: 10:00:00 via INTRAVENOUS

## 2016-11-17 MED ORDER — HEPARIN SOD (PORK) LOCK FLUSH 100 UNIT/ML IV SOLN
500.0000 [IU] | Freq: Once | INTRAVENOUS | Status: AC | PRN
  Administered 2016-11-17: 500 [IU]
  Filled 2016-11-17: qty 5

## 2016-11-17 MED ORDER — DIPHENHYDRAMINE HCL 25 MG PO CAPS
ORAL_CAPSULE | ORAL | Status: AC
  Filled 2016-11-17: qty 1

## 2016-11-17 MED ORDER — DENOSUMAB 120 MG/1.7ML ~~LOC~~ SOLN
120.0000 mg | Freq: Once | SUBCUTANEOUS | Status: AC
  Administered 2016-11-17: 120 mg via SUBCUTANEOUS
  Filled 2016-11-17: qty 1.7

## 2016-11-17 MED ORDER — SODIUM CHLORIDE 0.9 % IV SOLN
6.0000 mg/kg | Freq: Once | INTRAVENOUS | Status: AC
  Administered 2016-11-17: 420 mg via INTRAVENOUS
  Filled 2016-11-17: qty 20

## 2016-11-17 MED FILL — POTASSIUM CL ER 10 MEQ CAP: 10 | 30 days supply | Qty: 30 | Fill #0 | Status: TO

## 2016-11-17 MED FILL — traZODone HCL 150 MG TABS: 150 | 30 days supply | Qty: 60 | Fill #0 | Status: TO

## 2016-11-17 NOTE — Progress Notes (Signed)
Refused to stay for 30 minute observation after Perjeta.

## 2016-11-17 NOTE — Patient Instructions (Addendum)
[facility] Discharge Instructions for Patients Receiving Chemotherapy  Today you received the following chemotherapy agents: Herceptin and Perjeta.  To help prevent nausea and vomiting after your treatment, we encourage you to take your nausea medication as directed.    If you develop nausea and vomiting that is not controlled by your nausea medication, call the clinic.   BELOW ARE SYMPTOMS THAT SHOULD BE REPORTED IMMEDIATELY:  *FEVER GREATER THAN 100.5 F  *CHILLS WITH OR WITHOUT FEVER  NAUSEA AND VOMITING THAT IS NOT CONTROLLED WITH YOUR NAUSEA MEDICATION  *UNUSUAL SHORTNESS OF BREATH  *UNUSUAL BRUISING OR BLEEDING  TENDERNESS IN MOUTH AND THROAT WITH OR WITHOUT PRESENCE OF ULCERS  *URINARY PROBLEMS  *BOWEL PROBLEMS  UNUSUAL RASH Items with * indicate a potential emergency and should be followed up as soon as possible.  Feel free to call the clinic you have any questions or concerns. The clinic phone number is [phone].  Please show the [facility] at check-in to the Emergency Department and triage nurse.  Denosumab injection What is this medicine? DENOSUMAB (den oh sue mab) slows bone breakdown. Prolia is used to treat osteoporosis in women after menopause and in men. Delton See is used to prevent bone fractures and other bone problems caused by cancer bone metastases. Delton See is also used to treat giant cell tumor of the bone. COMMON BRAND NAME(S): Prolia, XGEVA What should I tell my health care provider before I take this medicine? They need to know if you have any of these conditions: -dental disease -eczema -infection or history of infections -kidney disease or on dialysis -low blood calcium or vitamin D -malabsorption syndrome -scheduled to have surgery or tooth extraction -taking medicine that contains denosumab -thyroid or parathyroid disease -an unusual reaction to denosumab, other medicines, foods, dyes, or preservatives -pregnant or  trying to get pregnant -breast-feeding How should I use this medicine? This medicine is for injection under the skin. It is given by a health care professional in a hospital or clinic setting. If you are getting Prolia, a special MedGuide will be given to you by the pharmacist with each prescription and refill. Be sure to read this information carefully each time. For Prolia, talk to your pediatrician regarding the use of this medicine in children. Special care may be needed. For Delton See, talk to your pediatrician regarding the use of this medicine in children. While this drug may be prescribed for children as young as 13 years for selected conditions, precautions do apply. What if I miss a dose? It is important not to miss your dose. Call your doctor or health care professional if you are unable to keep an appointment. What may interact with this medicine? Do not take this medicine with any of the following medications: -other medicines containing denosumab This medicine may also interact with the following medications: -medicines that suppress the immune system -medicines that treat cancer -steroid medicines like prednisone or cortisone What should I watch for while using this medicine? Visit your doctor or health care professional for regular checks on your progress. Your doctor or health care professional may order blood tests and other tests to see how you are doing. Call your doctor or health care professional if you get a cold or other infection while receiving this medicine. Do not treat yourself. This medicine may decrease your body's ability to fight infection. You should make sure you get enough calcium and vitamin D while you are taking this medicine, unless your doctor tells you  not to. Discuss the foods you eat and the vitamins you take with your health care professional. See your dentist regularly. Brush and floss your teeth as directed. Before you have any dental work done, tell your  dentist you are receiving this medicine. Do not become pregnant while taking this medicine or for 5 months after stopping it. Women should inform their doctor if they wish to become pregnant or think they might be pregnant. There is a potential for serious side effects to an unborn child. Talk to your health care professional or pharmacist for more information. What side effects may I notice from receiving this medicine? Side effects that you should report to your doctor or health care professional as soon as possible: -allergic reactions like skin rash, itching or hives, swelling of the face, lips, or tongue -breathing problems -chest pain -fast, irregular heartbeat -feeling faint or lightheaded, falls -fever, chills, or any other sign of infection -muscle spasms, tightening, or twitches -numbness or tingling -skin blisters or bumps, or is dry, peels, or red -slow healing or unexplained pain in the mouth or jaw -unusual bleeding or bruising Side effects that usually do not require medical attention (report to your doctor or health care professional if they continue or are bothersome): -muscle pain -stomach upset, gas Where should I keep my medicine? This medicine is only given in a clinic, doctor's office, or other health care setting and will not be stored at home.  2017 Elsevier/Gold Standard (2015-12-19 10:06:55)

## 2016-11-18 ENCOUNTER — Other Ambulatory Visit: Payer: Self-pay | Admitting: Oncology

## 2016-11-18 LAB — CANCER ANTIGEN 27.29: CA 27.29: 14.1 U/mL (ref 0.0–38.6)

## 2016-11-24 ENCOUNTER — Other Ambulatory Visit: Payer: Self-pay | Admitting: *Deleted

## 2016-11-24 ENCOUNTER — Ambulatory Visit: Payer: Medicaid Other | Admitting: Radiation Oncology

## 2016-11-24 ENCOUNTER — Telehealth: Payer: Self-pay | Admitting: *Deleted

## 2016-11-24 MED ORDER — DEXAMETHASONE 2 MG PO TABS: 2.0000 mg | ORAL_TABLET | Freq: Every day | ORAL | 0 refills | Status: DC

## 2016-11-24 MED ORDER — DEXAMETHASONE 2 MG PO TABS
2.0000 mg | ORAL_TABLET | Freq: Every day | ORAL | 0 refills | Status: DC
Start: 2016-11-24 — End: 2016-11-24

## 2016-11-24 NOTE — Telephone Encounter (Signed)
Per contact with pt post her VM - [name] states concerns with ongoing " ear ache " .  Discomfort is in left ear - and has continued for several weeks.  Note left ear is on side of brain irradiation - which is when ear first started hurting ( as well as side of brain surgery for brain tumor ).  Per discussion - pt will come in to [facility] for assessment and possible need for a referral to an ENT if warranted.  Appointment requested for tomorrow due to pt needing to arrange for transportation.

## 2016-11-25 ENCOUNTER — Telehealth: Payer: Self-pay | Admitting: *Deleted

## 2016-11-25 ENCOUNTER — Ambulatory Visit (HOSPITAL_BASED_OUTPATIENT_CLINIC_OR_DEPARTMENT_OTHER): Payer: Medicaid Other | Admitting: Nurse Practitioner

## 2016-11-25 ENCOUNTER — Encounter: Payer: Self-pay | Admitting: Nurse Practitioner

## 2016-11-25 VITALS — BP 144/81 | HR 103 | Temp 98.5°F | Resp 20 | Ht 65.0 in | Wt 164.2 lb

## 2016-11-25 DIAGNOSIS — H6123 Impacted cerumen, bilateral: Secondary | ICD-10-CM

## 2016-11-25 DIAGNOSIS — H6120 Impacted cerumen, unspecified ear: Secondary | ICD-10-CM | POA: Insufficient documentation

## 2016-11-25 DIAGNOSIS — C50312 Malignant neoplasm of lower-inner quadrant of left female breast: Secondary | ICD-10-CM | POA: Diagnosis not present

## 2016-11-25 NOTE — Assessment & Plan Note (Signed)
Patient was seen in the [facility] symptom management clinic.  Approximate 2 weeks ago with complaint of left ear pain.  At that time-patient was found to have serum and impaction to the right ear and a significant left ear otitis media.  She was advised to obtain the wax removal kit over-the-counter and to flush her right ear to remove the cerumen.  She was given Augmentin for treatment of the otitis media.  Patient returned to the [facility] today complaining of her hearing sounding muffled; but denies any chronic ear pain.  She also denies any recent fevers or chills.  She states that she continues to feel very tired on a regular basis.  Of note-patient states that she has been very busy over the Christmas holidays and also moved.  Exam today reveals continued cerumen impaction to the right ear that has not changed since last exam of that same ear.  Also, the infection to the left ear canal has completely resolved; and now can see full cerumen impaction to the left ear canal as well.  Patient was once again encouraged to use earwax removal kit to flush both ears as directed.  She was also given a [facility] and throat referral appointment to see Dr. Janace Hoard on 12/15/2016, at 2:35 PM.  Patient was advised to call and cancel her appointment if she flushes her ears and has no further issues.  Also, patient is scheduled for restaging brain MRI on 12/10/2016.  Patient was advised to call/return or go directly to the emergency department for any worsening symptoms whatsoever.

## 2016-11-25 NOTE — Assessment & Plan Note (Signed)
Patient continues to receive her Herceptin/Perjeta infusions as directed.  She is scheduled to return on 12/08/2016 for labs, visit, and her next infusion.  She is also scheduled for restaging brain MRI on 12/10/2016.

## 2016-11-25 NOTE — Telephone Encounter (Signed)
TCT patient to inform her of her ENT appt has been made for Dr. Janace Hoard  On 12/15/16 @ 2:35 pm @ [facility] on [address] pt address and phone #. Pt voiced understanding.

## 2016-11-25 NOTE — Progress Notes (Signed)
SYMPTOM MANAGEMENT CLINIC    Chief Complaint: Cerumen impaction  HPI:  [name] 55 y.o. female diagnosed with breast cancer with both bone and brain metastasis.  Currently undergoing Herceptin/Perjeta infusions.    No history exists.    Review of Systems  Constitutional: Positive for malaise/fatigue.  HENT: Positive for ear pain and hearing loss.   All other systems reviewed and are negative.   Past Medical History:  Diagnosis Date  . Alcohol abuse   . Anemia    during chemo  . Anxiety    At age 43  . Arthritis Dx 2010  . Bipolar disorder ([place])   . Cancer ([place])    breast mets to brain  . Chronic pain   . Complication of anesthesia   . Depression   . Fibromyalgia Dx 2005  . GERD (gastroesophageal reflux disease)   . Headache    hx  migraines  . Opiate dependence ([place])   . PONV (postoperative nausea and vomiting)   . Port-a-cath in place   . PTSD (post-traumatic stress disorder)     Past Surgical History:  Procedure Laterality Date  . APPLICATION OF CRANIAL NAVIGATION N/A 08/14/2016   Procedure: APPLICATION OF CRANIAL NAVIGATION;  Surgeon: Erline Levine, MD;  Location: [facility];  Service: Neurosurgery;  Laterality: N/A;  . BREAST RECONSTRUCTION Left    with silicone implant  . CRANIOTOMY N/A 08/14/2016   Procedure: CRANIOTOMY TUMOR EXCISION WITH Lucky Rathke;  Surgeon: Erline Levine, MD;  Location: [facility];  Service: Neurosurgery;  Laterality: N/A;  . FIBULA FRACTURE SURGERY Left   . MASTECTOMY Left   . RADIOLOGY WITH ANESTHESIA N/A 07/23/2016   Procedure: MRI OF BRAIN WITH AND WITHOUT;  Surgeon: Medication Radiologist, MD;  Location: [facility];  Service: Radiology;  Laterality: N/A;  . RADIOLOGY WITH ANESTHESIA N/A 09/08/2016   Procedure: MRI OF BRAIN WITH AND WITHOUT CONTRAST;  Surgeon: Medication Radiologist, MD;  Location: [facility];  Service: Radiology;  Laterality: N/A;  . right power port placement Right     has H/O alcohol abuse; Anxiety state;  Fibromyalgia; Family history of diabetes mellitus; Pap smear for cervical cancer screening; Current smoker; Healthcare maintenance; Seasonal allergies; Primary cancer of lower-inner quadrant of left female breast ([place]); Bone metastases ([place]); Iron deficiency anemia; Brain metastasis ([place]); Otitis media; and Cerumen impaction on her problem list.    is allergic to demerol and erythromycin.  Allergies as of 11/25/2016      Reactions   Demerol Itching, Nausea And Vomiting   Erythromycin Rash   Can take zpak      Medication List       Accurate as of 11/25/16  4:31 PM. Always use your most recent med list.          ALPRAZolam 1 MG tablet Commonly known as:  XANAX Take 1 tablet (1 mg total) by mouth as directed. Take xanax '1mg'$  tab oral 30 minutes before radaition treatments and every hour sleep,dispense 60 tabs no refill   amoxicillin-clavulanate 875-125 MG tablet Commonly known as:  AUGMENTIN Take 1 tablet by mouth 2 (two) times daily.   anastrozole 1 MG tablet Commonly known as:  ARIMIDEX Take 1 tablet (1 mg total) by mouth daily.   BIOTIN PO Take 1 capsule by mouth daily.   cetirizine 10 MG tablet Commonly known as:  ZYRTEC Take 1 tablet (10 mg total) by mouth daily.   cholestyramine 4 g packet Commonly known as:  QUESTRAN Take 1 packet (4 g total)  by mouth 3 (three) times daily with meals.   dexamethasone 2 MG tablet Commonly known as:  DECADRON Take 1 tablet (2 mg total) by mouth daily.   diphenoxylate-atropine 2.5-0.025 MG tablet Commonly known as:  LOMOTIL Take 2 tablets by mouth 4 (four) times daily as needed for diarrhea or loose stools.   fluticasone 50 MCG/ACT nasal spray Commonly known as:  FLONASE Place 2 sprays into both nostrils daily.   gabapentin 300 MG capsule Commonly known as:  NEURONTIN Take 2 capsules (600 mg total) by mouth 3 (three) times daily. 300 mg at night for one day, twice daily for one day, then three times daily   ibuprofen 200 MG  tablet Commonly known as:  ADVIL,MOTRIN Take 800 mg by mouth every 6 (six) hours as needed for mild pain.   IMODIUM PO Take 1 tablet by mouth as needed (diarrhea).   lamoTRIgine 100 MG tablet Commonly known as:  LAMICTAL Take 1.5 tablets (150 mg total) by mouth 2 (two) times daily.   lidocaine-prilocaine cream Commonly known as:  EMLA Apply 1 application topically as needed.   lithium carbonate 300 MG capsule TAKE 1 CAPSULE BY MOUTH TWICE DAILY WITH A MEAL   LORazepam 0.5 MG tablet Commonly known as:  ATIVAN Take 1 tablet (0.5 mg total) by mouth every 8 (eight) hours as needed for anxiety.   MUCINEX D 60-600 MG 12 hr tablet Generic drug:  pseudoephedrine-guaifenesin TAKE 1 TABLET BY MOUTH 2 TIMES DAILY FOR 1 TO 2 WEEKS AS NEEDED FOR CONGESTION.   MULTIPLE VITAMIN PO Take by mouth every morning.   ondansetron 8 MG tablet Commonly known as:  ZOFRAN Take 1 tablet (8 mg total) by mouth every 8 (eight) hours as needed for nausea or vomiting.   pantoprazole 40 MG tablet Commonly known as:  PROTONIX TAKE 1 TABLET BY MOUTH ONCE DAILY   potassium chloride 10 MEQ tablet Commonly known as:  K-DUR Take 1 tablet (10 mEq total) by mouth daily.   potassium chloride 10 MEQ tablet Commonly known as:  K-DUR,KLOR-CON TAKE 1 TABLET BY MOUTH ONCE DAILY   SONAFINE Apply 1 application topically daily. Apply to head after rad txs whern skin becomes itchy or irritated,   tiZANidine 4 MG tablet Commonly known as:  ZANAFLEX Take 1 tablet (4 mg total) by mouth daily.   traMADol 50 MG tablet Commonly known as:  ULTRAM Take 1 tablet (50 mg total) by mouth every 6 (six) hours as needed.   traZODone 150 MG tablet Commonly known as:  DESYREL TAKE 2 TABLETS BY MOUTH AT BEDTIME.   varenicline 0.5 MG tablet Commonly known as:  CHANTIX Take 1 tablet (0.5 mg total) by mouth daily.   Vitamin D (Cholecalciferol) 1000 units Caps Take 1,000 Units by mouth daily.        PHYSICAL  EXAMINATION  Oncology Vitals 11/25/2016 11/17/2016  Height 165 cm -  Weight 74.481 kg -  Weight (lbs) 164 lbs 3 oz -  BMI (kg/m2) 27.32 kg/m2 -  Temp 98.5 98  Pulse 103 88  Resp 20 16  SpO2 100 100  BSA (m2) 1.85 m2 -   BP Readings from Last 2 Encounters:  11/25/16 (!) 144/81  11/17/16 117/71    Physical Exam  Constitutional: She is oriented to person, place, and time and well-developed, well-nourished, and in no distress.  HENT:  Head: Normocephalic and atraumatic.  Exam today reveals continued cerumen impaction to the right ear that has not changed since last exam  of that same ear.  Also, the infection to the left ear canal has completely resolved; and now can see full cerumen impaction to the left ear canal as well.    Eyes: Conjunctivae and EOM are normal. Pupils are equal, round, and reactive to light. Right eye exhibits no discharge. Left eye exhibits no discharge. No scleral icterus.  Neck: Normal range of motion.  Pulmonary/Chest: Effort normal. No respiratory distress.  Musculoskeletal: Normal range of motion.  Neurological: She is alert and oriented to person, place, and time. Gait normal.  Skin: Skin is warm and dry.  Psychiatric: Affect normal.  Nursing note and vitals reviewed.   LABORATORY DATA:. No visits with results within 3 Day(s) from this visit.  Latest known visit with results is:  Appointment on 11/17/2016  Component Date Value Ref Range Status  . CA 27.29 11/18/2016 14.1  0.0 - 38.6 U/mL Final  . WBC 11/17/2016 12.0* 3.9 - 10.3 10e3/uL Final  . NEUT# 11/17/2016 11.0* 1.5 - 6.5 10e3/uL Final  . HGB 11/17/2016 13.5  11.6 - 15.9 g/dL Final  . HCT 11/17/2016 41.2  34.8 - 46.6 % Final  . Platelets 11/17/2016 215  145 - 400 10e3/uL Final  . MCV 11/17/2016 100.3  79.5 - 101.0 fL Final  . MCH 11/17/2016 32.8  25.1 - 34.0 pg Final  . MCHC 11/17/2016 32.7  31.5 - 36.0 g/dL Final  . RBC 11/17/2016 4.11  3.70 - 5.45 10e6/uL Final  . RDW 11/17/2016 13.6   11.2 - 14.5 % Final  . lymph# 11/17/2016 0.6* 0.9 - 3.3 10e3/uL Final  . MONO# 11/17/2016 0.3  0.1 - 0.9 10e3/uL Final  . Eosinophils Absolute 11/17/2016 0.0  0.0 - 0.5 10e3/uL Final  . Basophils Absolute 11/17/2016 0.0  0.0 - 0.1 10e3/uL Final  . NEUT% 11/17/2016 91.4* 38.4 - 76.8 % Final  . LYMPH% 11/17/2016 5.3* 14.0 - 49.7 % Final  . MONO% 11/17/2016 2.7  0.0 - 14.0 % Final  . EOS% 11/17/2016 0.3  0.0 - 7.0 % Final  . BASO% 11/17/2016 0.3  0.0 - 2.0 % Final  . Sodium 11/17/2016 138  136 - 145 mEq/L Final  . Potassium 11/17/2016 4.3  3.5 - 5.1 mEq/L Final  . Chloride 11/17/2016 102  98 - 109 mEq/L Final  . CO2 11/17/2016 27  22 - 29 mEq/L Final  . Glucose 11/17/2016 115  70 - 140 mg/dl Final  . BUN 11/17/2016 11.9  7.0 - 26.0 mg/dL Final  . Creatinine 11/17/2016 1.0  0.6 - 1.1 mg/dL Final  . Total Bilirubin 11/17/2016 0.39  0.20 - 1.20 mg/dL Final  . Alkaline Phosphatase 11/17/2016 46  40 - 150 U/L Final  . AST 11/17/2016 17  5 - 34 U/L Final  . ALT 11/17/2016 24  0 - 55 U/L Final  . Total Protein 11/17/2016 7.1  6.4 - 8.3 g/dL Final  . Albumin 11/17/2016 4.2  3.5 - 5.0 g/dL Final  . Calcium 11/17/2016 10.0  8.4 - 10.4 mg/dL Final  . Anion Gap 11/17/2016 8  3 - 11 mEq/L Final  . EGFR 11/17/2016 67* >90 ml/min/1.73 m2 Final    RADIOGRAPHIC STUDIES: No results found.  ASSESSMENT/PLAN:    Primary cancer of lower-inner quadrant of left female breast [facility]) Patient continues to receive her Herceptin/Perjeta infusions as directed.  She is scheduled to return on 12/08/2016 for labs, visit, and her next infusion.  She is also scheduled for restaging brain MRI on 12/10/2016.  Cerumen impaction  Patient was seen in the [facility] symptom management clinic.  Approximate 2 weeks ago with complaint of left ear pain.  At that time-patient was found to have serum and impaction to the right ear and a significant left ear otitis media.  She was advised to obtain the wax removal kit  over-the-counter and to flush her right ear to remove the cerumen.  She was given Augmentin for treatment of the otitis media.  Patient returned to the [facility] today complaining of her hearing sounding muffled; but denies any chronic ear pain.  She also denies any recent fevers or chills.  She states that she continues to feel very tired on a regular basis.  Of note-patient states that she has been very busy over the Christmas holidays and also moved.  Exam today reveals continued cerumen impaction to the right ear that has not changed since last exam of that same ear.  Also, the infection to the left ear canal has completely resolved; and now can see full cerumen impaction to the left ear canal as well.  Patient was once again encouraged to use earwax removal kit to flush both ears as directed.  She was also given a [place] internodes and throat referral appointment to see Dr. Janace Hoard on 12/15/2016, at 2:35 PM.  Patient was advised to call and cancel her appointment if she flushes her ears and has no further issues.  Also, patient is scheduled for restaging brain MRI on 12/10/2016.  Patient was advised to call/return or go directly to the emergency department for any worsening symptoms whatsoever.   Patient stated understanding of all instructions; and was in agreement with this plan of care. The patient knows to call the clinic with any problems, questions or concerns.   Total time spent with patient was 25 minutes;  with greater than 75 percent of that time spent in face to face counseling regarding patient's symptoms,  and coordination of care and follow up.  Disclaimer:This dictation was prepared with Dragon/digital dictation along with [employer]. Any transcriptional errors that result from this process are unintentional.  Drue Second, NP 11/25/2016

## 2016-12-01 ENCOUNTER — Other Ambulatory Visit: Payer: Self-pay | Admitting: Oncology

## 2016-12-01 ENCOUNTER — Other Ambulatory Visit: Payer: Self-pay | Admitting: *Deleted

## 2016-12-01 DIAGNOSIS — C7931 Secondary malignant neoplasm of brain: Secondary | ICD-10-CM

## 2016-12-01 DIAGNOSIS — C7951 Secondary malignant neoplasm of bone: Secondary | ICD-10-CM

## 2016-12-01 DIAGNOSIS — C50312 Malignant neoplasm of lower-inner quadrant of left female breast: Secondary | ICD-10-CM

## 2016-12-01 MED ORDER — METHADONE HCL 5 MG PO TABS: 5.0000 mg | ORAL_TABLET | Freq: Three times a day (TID) | ORAL | 0 refills | Status: DC

## 2016-12-01 MED ORDER — METHADONE HCL 10 MG PO TABS: 10.0000 mg | ORAL_TABLET | Freq: Three times a day (TID) | ORAL | 0 refills | Status: DC

## 2016-12-01 MED ORDER — ANTIPYRINE-BENZOCAINE 55-14 MG/ML OT SOLN: OTIC | 0 refills | Status: DC

## 2016-12-01 NOTE — Progress Notes (Unsigned)
[name] has been having ear problems with wax and probably an inner ear infection now for a couple of weeks. Today she called complaining of being more wobbly. I think it would be prudent to go ahead and obtain a brain MRI at this point I have put that order in.

## 2016-12-01 NOTE — Telephone Encounter (Signed)
Per MD of pt's call regarding pain medication - Opana and Oxy IR can not be refilled at this time.  Methadone can be refilled for pain coverage until MD can see her on 12/09/2016 for visit and discussion.  Informed Margaret.  Schedule printed and given with prescriptions.

## 2016-12-02 ENCOUNTER — Other Ambulatory Visit: Payer: Self-pay | Admitting: Oncology

## 2016-12-03 ENCOUNTER — Emergency Department (HOSPITAL_COMMUNITY): Payer: Medicaid Other

## 2016-12-03 ENCOUNTER — Encounter (HOSPITAL_COMMUNITY): Payer: Self-pay

## 2016-12-03 ENCOUNTER — Telehealth: Payer: Self-pay | Admitting: *Deleted

## 2016-12-03 ENCOUNTER — Inpatient Hospital Stay (HOSPITAL_COMMUNITY)
Admission: EM | Admit: 2016-12-03 | Discharge: 2016-12-10 | DRG: 071 | Disposition: A | Discharge status: Home / Self Care | Payer: Medicaid Other | Attending: Internal Medicine | Admitting: Internal Medicine

## 2016-12-03 DIAGNOSIS — F3130 Bipolar disorder, current episode depressed, mild or moderate severity, unspecified: Secondary | ICD-10-CM | POA: Diagnosis present

## 2016-12-03 DIAGNOSIS — R410 Disorientation, unspecified: Secondary | ICD-10-CM | POA: Diagnosis present

## 2016-12-03 DIAGNOSIS — Z8589 Personal history of malignant neoplasm of other organs and systems: Secondary | ICD-10-CM | POA: Diagnosis present

## 2016-12-03 DIAGNOSIS — C7931 Secondary malignant neoplasm of brain: Secondary | ICD-10-CM | POA: Diagnosis present

## 2016-12-03 DIAGNOSIS — Z170 Estrogen receptor positive status [ER+]: Secondary | ICD-10-CM | POA: Diagnosis not present

## 2016-12-03 DIAGNOSIS — R110 Nausea: Secondary | ICD-10-CM

## 2016-12-03 DIAGNOSIS — Z9221 Personal history of antineoplastic chemotherapy: Secondary | ICD-10-CM | POA: Diagnosis not present

## 2016-12-03 DIAGNOSIS — G9341 Metabolic encephalopathy: Principal | ICD-10-CM | POA: Diagnosis present

## 2016-12-03 DIAGNOSIS — F05 Delirium due to known physiological condition: Secondary | ICD-10-CM | POA: Diagnosis present

## 2016-12-03 DIAGNOSIS — M797 Fibromyalgia: Secondary | ICD-10-CM | POA: Diagnosis present

## 2016-12-03 DIAGNOSIS — Z79899 Other long term (current) drug therapy: Secondary | ICD-10-CM | POA: Diagnosis not present

## 2016-12-03 DIAGNOSIS — F4322 Adjustment disorder with anxiety: Secondary | ICD-10-CM

## 2016-12-03 DIAGNOSIS — G9340 Encephalopathy, unspecified: Secondary | ICD-10-CM | POA: Diagnosis not present

## 2016-12-03 DIAGNOSIS — Z923 Personal history of irradiation: Secondary | ICD-10-CM | POA: Diagnosis not present

## 2016-12-03 DIAGNOSIS — R197 Diarrhea, unspecified: Secondary | ICD-10-CM | POA: Diagnosis present

## 2016-12-03 DIAGNOSIS — Z85841 Personal history of malignant neoplasm of brain: Secondary | ICD-10-CM | POA: Diagnosis present

## 2016-12-03 DIAGNOSIS — Z853 Personal history of malignant neoplasm of breast: Secondary | ICD-10-CM

## 2016-12-03 DIAGNOSIS — Z79811 Long term (current) use of aromatase inhibitors: Secondary | ICD-10-CM | POA: Diagnosis not present

## 2016-12-03 DIAGNOSIS — F319 Bipolar disorder, unspecified: Secondary | ICD-10-CM | POA: Diagnosis present

## 2016-12-03 DIAGNOSIS — Z9889 Other specified postprocedural states: Secondary | ICD-10-CM | POA: Diagnosis not present

## 2016-12-03 DIAGNOSIS — F17210 Nicotine dependence, cigarettes, uncomplicated: Secondary | ICD-10-CM | POA: Diagnosis not present

## 2016-12-03 DIAGNOSIS — Z888 Allergy status to other drugs, medicaments and biological substances status: Secondary | ICD-10-CM | POA: Diagnosis not present

## 2016-12-03 DIAGNOSIS — C50919 Malignant neoplasm of unspecified site of unspecified female breast: Secondary | ICD-10-CM

## 2016-12-03 DIAGNOSIS — Z818 Family history of other mental and behavioral disorders: Secondary | ICD-10-CM | POA: Diagnosis not present

## 2016-12-03 DIAGNOSIS — Z833 Family history of diabetes mellitus: Secondary | ICD-10-CM | POA: Diagnosis not present

## 2016-12-03 DIAGNOSIS — Z9012 Acquired absence of left breast and nipple: Secondary | ICD-10-CM | POA: Diagnosis not present

## 2016-12-03 DIAGNOSIS — C7949 Secondary malignant neoplasm of other parts of nervous system: Secondary | ICD-10-CM | POA: Diagnosis not present

## 2016-12-03 DIAGNOSIS — C50312 Malignant neoplasm of lower-inner quadrant of left female breast: Secondary | ICD-10-CM | POA: Diagnosis present

## 2016-12-03 LAB — URINALYSIS, ROUTINE W REFLEX MICROSCOPIC
Bacteria, UA: NONE SEEN
Bacteria, UA: NONE SEEN
Bilirubin Urine: NEGATIVE
Bilirubin Urine: NEGATIVE
Glucose, UA: NEGATIVE mg/dL
Glucose, UA: NEGATIVE mg/dL
Ketones, ur: 80 mg/dL — AB
Ketones, ur: 80 mg/dL — AB
Leukocytes, UA: NEGATIVE
Leukocytes, UA: NEGATIVE
Nitrite: NEGATIVE
Nitrite: NEGATIVE
Protein, ur: 30 mg/dL — AB
Protein, ur: NEGATIVE mg/dL
Specific Gravity, Urine: 1.024 (ref 1.005–1.030)
Specific Gravity, Urine: 1.021 (ref 1.005–1.030)
pH: 5 (ref 5.0–8.0)
pH: 5 (ref 5.0–8.0)

## 2016-12-03 LAB — VITAMIN B12: Vitamin B-12: 324 pg/mL (ref 180–914)

## 2016-12-03 LAB — CBC WITH DIFFERENTIAL/PLATELET
Basophils Absolute: 0 10*3/uL (ref 0.0–0.1)
Basophils Relative: 0 %
Eosinophils Absolute: 0 10*3/uL (ref 0.0–0.7)
Eosinophils Relative: 0 %
HCT: 40.8 % (ref 36.0–46.0)
Hemoglobin: 14.1 g/dL (ref 12.0–15.0)
Lymphocytes Relative: 8 %
Lymphs Abs: 0.7 10*3/uL (ref 0.7–4.0)
MCH: 32.8 pg (ref 26.0–34.0)
MCHC: 34.6 g/dL (ref 30.0–36.0)
MCV: 94.9 fL (ref 78.0–100.0)
Monocytes Absolute: 0.7 10*3/uL (ref 0.1–1.0)
Monocytes Relative: 7 %
Neutro Abs: 8.5 10*3/uL — ABNORMAL HIGH (ref 1.7–7.7)
Neutrophils Relative %: 85 %
Platelets: 241 10*3/uL (ref 150–400)
RBC: 4.3 MIL/uL (ref 3.87–5.11)
RDW: 12.8 % (ref 11.5–15.5)
WBC: 9.9 10*3/uL (ref 4.0–10.5)

## 2016-12-03 LAB — COMPREHENSIVE METABOLIC PANEL
ALT: 22 U/L (ref 14–54)
AST: 23 U/L (ref 15–41)
Albumin: 4.9 g/dL (ref 3.5–5.0)
Alkaline Phosphatase: 41 U/L (ref 38–126)
Anion gap: 14 (ref 5–15)
BUN: 16 mg/dL (ref 6–20)
CO2: 16 mmol/L — ABNORMAL LOW (ref 22–32)
Calcium: 9 mg/dL (ref 8.9–10.3)
Chloride: 105 mmol/L (ref 101–111)
Creatinine, Ser: 0.75 mg/dL (ref 0.44–1.00)
GFR calc Af Amer: >60 mL/min (ref >60)
GFR calc non Af Amer: >60 mL/min (ref >60)
Glucose, Bld: 120 mg/dL — ABNORMAL HIGH (ref 65–99)
Potassium: 3.3 mmol/L — ABNORMAL LOW (ref 3.5–5.1)
Sodium: 135 mmol/L (ref 135–145)
Total Bilirubin: 0.7 mg/dL (ref 0.3–1.2)
Total Protein: 7.1 g/dL (ref 6.5–8.1)

## 2016-12-03 LAB — RAPID URINE DRUG SCREEN, HOSP PERFORMED
Amphetamines: NOT DETECTED
Amphetamines: NOT DETECTED
Barbiturates: NOT DETECTED
Barbiturates: NOT DETECTED
Benzodiazepines: POSITIVE — AB
Benzodiazepines: POSITIVE — AB
Cocaine: NOT DETECTED
Cocaine: NOT DETECTED
Opiates: POSITIVE — AB
Opiates: POSITIVE — AB
Tetrahydrocannabinol: NOT DETECTED
Tetrahydrocannabinol: NOT DETECTED

## 2016-12-03 LAB — I-STAT CG4 LACTIC ACID, ED: Lactic Acid, Venous: 0.56 mmol/L (ref 0.5–1.9)

## 2016-12-03 LAB — SALICYLATE LEVEL: Salicylate Lvl: <7 mg/dL (ref 2.8–30.0)

## 2016-12-03 LAB — PROTIME-INR
INR: 0.99
Prothrombin Time: 13.1 seconds (ref 11.4–15.2)

## 2016-12-03 LAB — TSH: TSH: 3.693 u[IU]/mL (ref 0.350–4.500)

## 2016-12-03 LAB — LITHIUM LEVEL
Lithium Lvl: 0.21 mmol/L — ABNORMAL LOW (ref 0.60–1.20)
Lithium Lvl: 0.18 mmol/L — ABNORMAL LOW (ref 0.60–1.20)

## 2016-12-03 LAB — ETHANOL: Alcohol, Ethyl (B): <5 mg/dL (ref <5)

## 2016-12-03 LAB — AMMONIA
Ammonia: 20 umol/L (ref 9–35)
Ammonia: 17 umol/L (ref 9–35)

## 2016-12-03 LAB — ACETAMINOPHEN LEVEL: Acetaminophen (Tylenol), Serum: <10 ug/mL — ABNORMAL LOW (ref 10–30)

## 2016-12-03 MED ORDER — POTASSIUM CHLORIDE CRYS ER 10 MEQ PO TBCR: 10.0000 meq | EXTENDED_RELEASE_TABLET | Freq: Every day | ORAL | Status: DC

## 2016-12-03 MED ORDER — ENOXAPARIN SODIUM 40 MG/0.4ML ~~LOC~~ SOLN
40.0000 mg | SUBCUTANEOUS | Status: DC
  Administered 2016-12-04 – 2016-12-05 (×2): 40 mg via SUBCUTANEOUS
  Filled 2016-12-03 (×5): qty 0.4

## 2016-12-03 MED ORDER — LORAZEPAM 2 MG/ML IJ SOLN
2.0000 mg | Freq: Once | INTRAMUSCULAR | Status: AC
  Administered 2016-12-03: 2 mg via INTRAVENOUS
  Filled 2016-12-03: qty 1

## 2016-12-03 MED ORDER — SODIUM CHLORIDE 0.9 % IV SOLN: INTRAVENOUS | Status: AC

## 2016-12-03 MED ORDER — HYDROMORPHONE HCL 1 MG/ML IJ SOLN
1.0000 mg | Freq: Once | INTRAMUSCULAR | Status: AC
  Administered 2016-12-03: 1 mg via INTRAVENOUS
  Filled 2016-12-03: qty 1

## 2016-12-03 MED ORDER — SODIUM CHLORIDE 0.9 % IV SOLN: INTRAVENOUS | Status: DC

## 2016-12-03 MED ORDER — ACETAMINOPHEN 325 MG PO TABS
650.0000 mg | ORAL_TABLET | Freq: Four times a day (QID) | ORAL | Status: DC | PRN
  Administered 2016-12-04 – 2016-12-10 (×5): 650 mg via ORAL
  Filled 2016-12-03 (×5): qty 2

## 2016-12-03 MED ORDER — CHOLESTYRAMINE 4 G PO PACK
4.0000 g | PACK | Freq: Three times a day (TID) | ORAL | Status: DC
  Administered 2016-12-07 – 2016-12-10 (×8): 4 g via ORAL
  Filled 2016-12-03 (×20): qty 1

## 2016-12-03 MED ORDER — ACETAMINOPHEN 650 MG RE SUPP: 650.0000 mg | Freq: Four times a day (QID) | RECTAL | Status: DC | PRN

## 2016-12-03 MED ORDER — PANTOPRAZOLE SODIUM 40 MG PO TBEC
40.0000 mg | DELAYED_RELEASE_TABLET | Freq: Every day | ORAL | Status: DC
  Administered 2016-12-04 – 2016-12-09 (×6): 40 mg via ORAL
  Filled 2016-12-03 (×7): qty 1

## 2016-12-03 MED ORDER — ONDANSETRON HCL 4 MG PO TABS
4.0000 mg | ORAL_TABLET | Freq: Four times a day (QID) | ORAL | Status: DC | PRN
  Administered 2016-12-08 – 2016-12-09 (×3): 4 mg via ORAL
  Filled 2016-12-03 (×4): qty 1

## 2016-12-03 MED ORDER — ONDANSETRON HCL 4 MG/2ML IJ SOLN
4.0000 mg | Freq: Four times a day (QID) | INTRAMUSCULAR | Status: DC | PRN
  Administered 2016-12-04 – 2016-12-10 (×2): 4 mg via INTRAVENOUS
  Filled 2016-12-03 (×2): qty 2

## 2016-12-03 MED ORDER — POTASSIUM CHLORIDE IN NACL 20-0.9 MEQ/L-% IV SOLN
INTRAVENOUS | Status: DC
  Administered 2016-12-03 – 2016-12-05 (×4): via INTRAVENOUS
  Filled 2016-12-03 (×8): qty 1000

## 2016-12-03 MED ORDER — SODIUM CHLORIDE 0.9 % IV SOLN
Freq: Once | INTRAVENOUS | Status: AC
  Administered 2016-12-03: 13:00:00 via INTRAVENOUS

## 2016-12-03 MED ORDER — ANASTROZOLE 1 MG PO TABS
1.0000 mg | ORAL_TABLET | Freq: Every day | ORAL | Status: DC
  Administered 2016-12-05 – 2016-12-09 (×5): 1 mg via ORAL
  Filled 2016-12-03 (×7): qty 1

## 2016-12-03 MED ORDER — SODIUM CHLORIDE 0.9% FLUSH
3.0000 mL | Freq: Two times a day (BID) | INTRAVENOUS | Status: DC
  Administered 2016-12-03 – 2016-12-06 (×5): 3 mL via INTRAVENOUS

## 2016-12-03 MED ORDER — LORAZEPAM 2 MG/ML IJ SOLN
1.0000 mg | INTRAMUSCULAR | Status: DC | PRN
  Administered 2016-12-04 – 2016-12-06 (×8): 1 mg via INTRAVENOUS
  Filled 2016-12-03 (×10): qty 1

## 2016-12-03 MED ORDER — POTASSIUM CHLORIDE CRYS ER 10 MEQ PO TBCR
10.0000 meq | EXTENDED_RELEASE_TABLET | Freq: Every day | ORAL | Status: DC
  Administered 2016-12-04 – 2016-12-09 (×6): 10 meq via ORAL
  Filled 2016-12-03 (×9): qty 1

## 2016-12-03 NOTE — ED Provider Notes (Signed)
[facility] DEPT Provider Note   CSN: [id number] Arrival date & time: 12/03/16  1156     History   Chief Complaint Chief Complaint  Patient presents with  . CA Pt  . Altered Mental Status  . Diarrhea  . Fall    HPI [name] is a 56 y.o. female.  HPI  I had long phone conversation with patient's sister, Marliss Czar. The patient does is NOT DNR. They expect that she would improve after treatment of her brain cancer and radiation therapy. Patient was last spoken to by family members 4 days ago. At that time she had complained of having headaches and not feeling very well. Family members do not live directly in this area. Patient lives with a companion named Cletus. Reportedly this companion was a romantic interest at one time but at this time her sister's unsure what their exact relationship is. He is a primary caregiver who lives with the patient and they work together managing properties. Other family members are sons who were supposed to have a POA but it is uncertain if that was ever done. Other family members live in [place]. Patient's sister, Marliss Czar, advises that the patient needs to have medical care including CT scans to determine what the problem is in that her behavior is not typical and may be representative of confusion due to recurrent brain tumor. Patient does have a more distant history of substance use and dependency. She has been clean of any use of drugs for 4 years per the patient's sister.  I also conversation subsequently with the patient's son who reports being power of attorney, [name]. He advises that patient would want full medical care. He reports that she is confused at this time and that any care needed is appropriate for her. He also advises that Cletus is a close family friend and reliable caregiver who can be involved in the patient's care and medical decision-making.  Patient is very limited historian. She can only say that she doesn't feel right she wants to  go home. Subsequent to the patient arriving and initiating care, I was able to get additional history from and urine, Cletus. He advises that she had a fairly distinct change today. At baseline she is cognitively very sharp and active with a normal daily activities. Today he reports that her apartment was disheveled and actually appear to have  damage to the door frame. She also lost control of her bowels and defecated on herself which is not happened previously. He reports her behavior is completely different than her normal.  Past Medical History:  Diagnosis Date  . Alcohol abuse   . Anemia    during chemo  . Anxiety    At age 52  . Arthritis Dx 2010  . Bipolar disorder ([place])   . Cancer ([place])    breast mets to brain  . Chronic pain   . Complication of anesthesia   . Depression   . Fibromyalgia Dx 2005  . GERD (gastroesophageal reflux disease)   . Headache    hx  migraines  . Opiate dependence ([place])   . PONV (postoperative nausea and vomiting)   . Port-a-cath in place   . PTSD (post-traumatic stress disorder)     Patient Active Problem List   Diagnosis Date Noted  . Cerumen impaction 11/25/2016  . Otitis media 11/06/2016  . Brain metastasis ([place]) 07/27/2016  . Iron deficiency anemia 06/26/2016  . Bone metastases ([place]) 06/03/2016  . Primary cancer of lower-inner quadrant of  left female breast ([place]) 06/01/2016  . Pap smear for cervical cancer screening 03/28/2015  . Current smoker 03/28/2015  . Healthcare maintenance 03/28/2015  . Seasonal allergies 03/28/2015  . Anxiety state 02/28/2015  . Fibromyalgia 02/28/2015  . Family history of diabetes mellitus 02/28/2015  . H/O alcohol abuse     Past Surgical History:  Procedure Laterality Date  . APPLICATION OF CRANIAL NAVIGATION N/A 08/14/2016   Procedure: APPLICATION OF CRANIAL NAVIGATION;  Surgeon: Erline Levine, MD;  Location: [facility];  Service: Neurosurgery;  Laterality: N/A;  . BREAST RECONSTRUCTION Left    with  silicone implant  . CRANIOTOMY N/A 08/14/2016   Procedure: CRANIOTOMY TUMOR EXCISION WITH Lucky Rathke;  Surgeon: Erline Levine, MD;  Location: [facility];  Service: Neurosurgery;  Laterality: N/A;  . FIBULA FRACTURE SURGERY Left   . MASTECTOMY Left   . RADIOLOGY WITH ANESTHESIA N/A 07/23/2016   Procedure: MRI OF BRAIN WITH AND WITHOUT;  Surgeon: Medication Radiologist, MD;  Location: [facility];  Service: Radiology;  Laterality: N/A;  . RADIOLOGY WITH ANESTHESIA N/A 09/08/2016   Procedure: MRI OF BRAIN WITH AND WITHOUT CONTRAST;  Surgeon: Medication Radiologist, MD;  Location: [facility];  Service: Radiology;  Laterality: N/A;  . right power port placement Right     OB History    No data available       Home Medications    Prior to Admission medications   Medication Sig Start Date End Date Taking? Authorizing Provider  ALPRAZolam Duanne Moron) 1 MG tablet Take 1 tablet (1 mg total) by mouth as directed. Take xanax 1mg  tab oral 30 minutes before radaition treatments and every hour sleep,dispense 60 tabs no refill 11/02/16   Hayden Pedro, PA-C  amoxicillin-clavulanate (AUGMENTIN) 875-125 MG tablet Take 1 tablet by mouth 2 (two) times daily. 11/06/16   Susanne Borders, NP  anastrozole (ARIMIDEX) 1 MG tablet Take 1 tablet (1 mg total) by mouth daily. 08/25/16   Chauncey Cruel, MD  Benzocaine-Antipyrine 55-14 MG/ML SOLN 2-4 drops each ear three times a day. Wet cotton ball with drop and place in ear post instilling drops. 12/01/16   Chauncey Cruel, MD  BIOTIN PO Take 1 capsule by mouth daily.    Historical Provider, MD  cetirizine (ZYRTEC) 10 MG tablet Take 1 tablet (10 mg total) by mouth daily. 07/14/16   Chauncey Cruel, MD  cholestyramine Lucrezia Starch) 4 g packet Take 1 packet (4 g total) by mouth 3 (three) times daily with meals. 10/27/16   Chauncey Cruel, MD  dexamethasone (DECADRON) 2 MG tablet Take 1 tablet (2 mg total) by mouth daily. 11/24/16   Chauncey Cruel, MD  diphenoxylate-atropine  (LOMOTIL) 2.5-0.025 MG tablet Take 2 tablets by mouth 4 (four) times daily as needed for diarrhea or loose stools. 10/27/16   Chauncey Cruel, MD  fluticasone (FLONASE) 50 MCG/ACT nasal spray Place 2 sprays into both nostrils daily. 10/27/16   Chauncey Cruel, MD  gabapentin (NEURONTIN) 300 MG capsule Take 2 capsules (600 mg total) by mouth 3 (three) times daily. 300 mg at night for one day, twice daily for one day, then three times daily 11/11/16   Chauncey Cruel, MD  ibuprofen (ADVIL,MOTRIN) 200 MG tablet Take 800 mg by mouth every 6 (six) hours as needed for mild pain.    Historical Provider, MD  lamoTRIgine (LAMICTAL) 100 MG tablet Take 1.5 tablets (150 mg total) by mouth 2 (two) times daily. 11/11/16   Chauncey Cruel, MD  lidocaine-prilocaine (EMLA) cream Apply 1 application topically as needed. 06/19/16   Chauncey Cruel, MD  lithium carbonate 300 MG capsule TAKE 1 CAPSULE BY MOUTH TWICE DAILY WITH A MEAL 11/16/16   Chauncey Cruel, MD  Loperamide HCl (IMODIUM PO) Take 1 tablet by mouth as needed (diarrhea).     Historical Provider, MD  LORazepam (ATIVAN) 0.5 MG tablet Take 1 tablet (0.5 mg total) by mouth every 8 (eight) hours as needed for anxiety. 08/25/16   Chauncey Cruel, MD  methadone (DOLOPHINE) 10 MG tablet Take 1 tablet (10 mg total) by mouth every 8 (eight) hours. 12/01/16   Chauncey Cruel, MD  methadone (DOLOPHINE) 5 MG tablet Take 1 tablet (5 mg total) by mouth every 8 (eight) hours. 12/01/16   Virgie Dad Magrinat, MD  MUCINEX D 60-600 MG 12 hr tablet TAKE 1 TABLET BY MOUTH 2 TIMES DAILY FOR 1 TO 2 WEEKS AS NEEDED FOR CONGESTION. 11/18/16   Chauncey Cruel, MD  MULTIPLE VITAMIN PO Take by mouth every morning.    Historical Provider, MD  ondansetron (ZOFRAN) 8 MG tablet Take 1 tablet (8 mg total) by mouth every 8 (eight) hours as needed for nausea or vomiting. 08/17/16   Chauncey Cruel, MD  pantoprazole (PROTONIX) 40 MG tablet TAKE 1 TABLET BY MOUTH ONCE DAILY 10/28/16    Chauncey Cruel, MD  potassium chloride (K-DUR) 10 MEQ tablet Take 1 tablet (10 mEq total) by mouth daily. 07/07/16   Chauncey Cruel, MD  potassium chloride (K-DUR,KLOR-CON) 10 MEQ tablet TAKE 1 TABLET BY MOUTH ONCE DAILY 11/16/16   Chauncey Cruel, MD  tiZANidine (ZANAFLEX) 4 MG tablet Take 1 tablet (4 mg total) by mouth daily. 06/12/16   Kyung Rudd, MD  traMADol (ULTRAM) 50 MG tablet Take 1 tablet (50 mg total) by mouth every 6 (six) hours as needed. 11/06/16   Susanne Borders, NP  traZODone (DESYREL) 150 MG tablet TAKE 2 TABLETS BY MOUTH AT BEDTIME. 11/16/16   Chauncey Cruel, MD  varenicline (CHANTIX) 0.5 MG tablet Take 1 tablet (0.5 mg total) by mouth daily. 11/11/16   Chauncey Cruel, MD  Vitamin D, Cholecalciferol, 1000 units CAPS Take 1,000 Units by mouth daily.    Historical Provider, MD  Wound Dressings (SONAFINE) Apply 1 application topically daily. Apply to head after rad txs whern skin becomes itchy or irritated,    Hayden Pedro, PA-C    Family History Family History  Problem Relation Age of Onset  . Diabetes Mother   . Bipolar disorder Mother   . CAD Father     Social History Social History  Substance Use Topics  . Smoking status: Current Every Day Smoker    Packs/day: 0.50    Types: Cigarettes  . Smokeless tobacco: Never Used     Comment: Pt is on Chantix at present time  . Alcohol use Yes     Comment: no ETOH since 08/22/12     Allergies   Demerol and Erythromycin   Review of Systems Review of Systems Level V caveat delirium. Cannot obtain review of systems.  Physical Exam Updated Vital Signs BP 138/77 (BP Location: Left Arm)   Pulse 111   Temp 97.9 F (36.6 C) (Oral)   Resp 15   Ht 5\' 5"  (1.651 m)   Wt 164 lb (74.4 kg)   SpO2 98%   BMI 27.29 kg/m   Physical Exam  Constitutional: She appears well-developed and well-nourished.  Patient arrives  alert but very agitated. She keeps putting her hands to her head and trying to scoot to  the front of the stretcher stating that she has to get out of here. She has no respiratory distress. Her color is good. Patient has complete loss of hair.  HENT:  Right Ear: External ear normal.  Left Ear: External ear normal.  Nose: Nose normal.  Mouth/Throat: Oropharynx is clear and moist.  Patient has a well-healed posterior scalp and upper neck surgical scar. No evident areas of hematomas or abrasions. He has complete loss of her scalp hair.  Eyes: Conjunctivae and EOM are normal. Pupils are equal, round, and reactive to light.  Neck: Neck supple.  Patient turns the head from side to side and moves in all directions without exhibiting meningismus.  Cardiovascular: Normal rate, regular rhythm, normal heart sounds and intact distal pulses.   Pulmonary/Chest: Effort normal and breath sounds normal.  Abdominal: Soft. She exhibits no distension. There is no tenderness. There is no guarding.  Musculoskeletal: Normal range of motion. She exhibits no edema, tenderness or deformity.  Extremities her normal condition. She has no peripheral edema. The calves are soft. Skin condition of the lower extremities is very good.  Neurological:  Patient is highly agitated. Her speech is clear but she cannot provide any history by recall. She is using all 4 extremities with purposeful movement. When assisting him to move up in the stretcher by having her stand, she seemed very unsteady and required assistance for balance. There is no focal motor weakness.  Skin: Skin is warm and dry.     ED Treatments / Results  Labs (all labs ordered are listed, but only abnormal results are displayed) Labs Reviewed  COMPREHENSIVE METABOLIC PANEL - Abnormal; Notable for the following:       Result Value   Potassium 3.3 (*)    CO2 16 (*)    Glucose, Bld 120 (*)    All other components within normal limits  ACETAMINOPHEN LEVEL - Abnormal; Notable for the following:    Acetaminophen (Tylenol), Serum <10 (*)    All  other components within normal limits  CBC WITH DIFFERENTIAL/PLATELET - Abnormal; Notable for the following:    Neutro Abs 8.5 (*)    All other components within normal limits  GASTROINTESTINAL PANEL BY PCR, STOOL (REPLACES STOOL CULTURE)  C DIFFICILE QUICK SCREEN W PCR REFLEX  ETHANOL  SALICYLATE LEVEL  PROTIME-INR  URINALYSIS, ROUTINE W REFLEX MICROSCOPIC  RAPID URINE DRUG SCREEN, HOSP PERFORMED  LITHIUM LEVEL  AMMONIA  I-STAT CG4 LACTIC ACID, ED    EKG  EKG Interpretation  Date/Time:  Thursday December 03 2016 13:22:01 EST Ventricular Rate:  103 PR Interval:    QRS Duration: 63 QT Interval:  371 QTC Calculation: 486 R Axis:   65 Text Interpretation:  Sinus tachycardia no significant change from previous Confirmed by Johnney Killian, MD, Jeannie Done [phone]) on 12/03/2016 2:13:57 PM       Radiology Ct Head Wo Contrast  Result Date: 12/03/2016 CLINICAL DATA:  Increased altered mental status, fell 3 days ago. History of breast cancer with metastasis to the brain. EXAM: CT HEAD WITHOUT CONTRAST TECHNIQUE: Contiguous axial images were obtained from the base of the skull through the vertex without intravenous contrast. COMPARISON:  MRI from 09/08/2016.  Sore FINDINGS: Brain: No evidence of acute infarction, hemorrhage, hydrocephalus, nor extra-axial collection. Suboccipital postoperative change involving the left skull base. Tiny focus of hypodensity in the left cerebellar hemisphere consistent with a tiny focus of  postoperative encephalomalacia. No new intra-axial mass like abnormality identified. Vascular: Nonacute Skull: Postop change left skullbase. Sinuses/Orbits: Nonacute. Other: None IMPRESSION: Left suboccipital postoperative change with tiny focus of encephalomalacia at the site of prior cerebellar metastasis. No acute intracranial abnormality identified. Electronically Signed   By: Ashley Royalty M.D.   On: 12/03/2016 15:32    Procedures Procedures (including critical care time) CRITICAL  CARE Performed by: Charlesetta Shanks   Total critical care time: 60  minutes  Critical care time was exclusive of separately billable procedures and treating other patients.  Critical care was necessary to treat or prevent imminent or life-threatening deterioration.  Critical care was time spent personally by me on the following activities: development of treatment plan with patient and/or surrogate as well as nursing, discussions with consultants, evaluation of patient's response to treatment, examination of patient, obtaining history from patient or surrogate, ordering and performing treatments and interventions, ordering and review of laboratory studies, ordering and review of radiographic studies, pulse oximetry and re-evaluation of patient's condition. Medications Ordered in ED Medications  LORazepam (ATIVAN) injection 2 mg (2 mg Intravenous Given 12/03/16 1244)  0.9 %  sodium chloride infusion ( Intravenous New Bag/Given 12/03/16 1322)  LORazepam (ATIVAN) injection 2 mg (2 mg Intravenous Given 12/03/16 1415)  LORazepam (ATIVAN) injection 2 mg (2 mg Intravenous Given 12/03/16 1511)  HYDROmorphone (DILAUDID) injection 1 mg (1 mg Intravenous Given 12/03/16 1511)     Initial Impression / Assessment and Plan / ED Course  I have reviewed the triage vital signs and the nursing notes.  Pertinent labs & imaging results that were available during my care of the patient were reviewed by me and considered in my medical decision making (see chart for details).  Clinical Course    Consult: [facility] hospitalist Dr. Leanord Asal for admission.  Final Clinical Impressions(s) / ED Diagnoses   Final diagnoses:  Confusion  Delirium  Metastatic breast cancer [facility])   Patient present with acute and atypical confusion and agitation. Patient shows signs of delirium with picking behavior and removal of garments. High-dose benzodiazepine required for sedation. CT head does not show acute finding however patient does have  history of metastatic brain cancer treated. At this time, consideration is for medication reaction such as steroid-induced psychosis, brain lesion not identified on CT scan, possible withdrawal syndrome although patient's companion indicates that she seemed well up until the past day or so and it seems unlikely she would have  immediate withdrawal from a days worth of noncompliance benzodiazepine and narcotic. Plan will be for admission for acute mental status change with severe agitation and significant comorbid illnesses. New Prescriptions New Prescriptions   No medications on file     Charlesetta Shanks, MD 12/04/16 [phone]

## 2016-12-03 NOTE — ED Notes (Signed)
Patient refusing all treatment.

## 2016-12-03 NOTE — H&P (Signed)
History and Physical  [name] [id number] DOB: [birth year] DOA: 12/03/2016   PCP: Chauncey Cruel, MD   Patient coming from: Home  Chief Complaint: confusion  HPI:  [name] is a 56 y.o. female with medical history of metastatic breast cancer to bone and brain, depression, fibromyalgia, remote alcohol abuse, bipolar disorder, and chronic pain syndrome. The patient last received chemotherapy with  Herceptin/Perjeta on 11/17/2016 under the care of Dr. [name]. In addition, the patient has received brain radiation from 09/17/2016 through 10/07/2016. She has also previously received radiation to the chest wall which she finished on 06/26/2016. In addition, the patient was recently underwent craniectomy with resection of a metastatic lesion on her left cerebellum on 08/14/2016 performed by  Dr. Vertell Limber. The patient is encephalopathic and is unable to provide any history. All of this history is obtained from review of the medical record in speaking to the patient's sister. For the last 3-4 weeks, family has noted that the patient has had a cognitive and functional decline with increasing generalized weakness. In addition, the patient has chronic loose stools which has been going on for many months. The patient had not called her family for a period of 3-4 days until 11/30/2016. At that time, the patient was slurring her words and appeared to be confused on the telephone. Her family urged her caretaker (Cletus) and the patient to go to ED.  Apparently, Cletus found the patient in bed on the morning of admission. The patient apparently was slurring her words, confused and screaming at him.  As a result, EMS was activated.  In the emergency department, the patient was afebrile and hemodynamically stable and saturating well on room air. However she was very confused and agitated. For a CT of the brain to be done, the patient required 7 mg of IV Ativan. Alcohol level was undetectable. CT of the  brain was negative for any acute findings showed post surgical changes at the site of her left cerebellum. EKG shows sinus tachycardia with nonspecific ST changes. CBC was unremarkable. BMP showed potassium 3.3 with bicarbonate 16 and serum creatinine 0.75. Hepatic enzymes were unremarkable. Alcohol, acetaminophen, salicylates, and lactic acid were unremarkable. Due to the patient's acute encephalopathy, the patient was admitted for further workup.  Assessment/Plan: Acute encephalopathy -According to the patient's sister the patient has had a gradual cognitive decline over the past several weeks, but this appears to be more acute since New Year's Day. 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 -check serum B12, TSH, RPR, HIV, ammonia, UA and urine culture, urine drug screen -If all of the above is unremarkable, consult neurology for possible LP and further workup for paraneoplastic encephalopathy -Other considerations may include new metastasis and steroid psychosis (less likely as the patient has been on steroids for the past 3-4 months without delirium) -Unfortunately, I was not able to reach Cletus [phone]) to discuss pt's medications--> discontinue dexamethasone, gabapentin, trazodone, Lamictal, Chantix, tramadol, Xanax, methadone, Zanaflex until further notice -According to Dr. Virgie Dad last note, the patient is not on methadone -Check lithium level -Ativan 1 mg IV every 4 hours when necessary agitation -At the time of my evaluation, the patient oriented only to name, and able to answer a few yes no questions  Diarrhea -Check C. Difficile -Stool pathogen panel  Metastatic breast cancer -Please notify Dr. [name] in am -I have place pt on his consult list -continue arimidex  Bipolar disorder -Holding all her psychotropic medications at this time secondary to  acute encephalopathy        Past Medical History:  Diagnosis Date  . Alcohol abuse   .  Anemia    during chemo  . Anxiety    At age 57  . Arthritis Dx 2010  . Bipolar disorder ([place])   . Cancer ([place])    breast mets to brain  . Chronic pain   . Complication of anesthesia   . Depression   . Fibromyalgia Dx 2005  . GERD (gastroesophageal reflux disease)   . Headache    hx  migraines  . Opiate dependence ([place])   . PONV (postoperative nausea and vomiting)   . Port-a-cath in place   . PTSD (post-traumatic stress disorder)    Past Surgical History:  Procedure Laterality Date  . APPLICATION OF CRANIAL NAVIGATION N/A 08/14/2016   Procedure: APPLICATION OF CRANIAL NAVIGATION;  Surgeon: Erline Levine, MD;  Location: [facility];  Service: Neurosurgery;  Laterality: N/A;  . BREAST RECONSTRUCTION Left    with silicone implant  . CRANIOTOMY N/A 08/14/2016   Procedure: CRANIOTOMY TUMOR EXCISION WITH Lucky Rathke;  Surgeon: Erline Levine, MD;  Location: [facility];  Service: Neurosurgery;  Laterality: N/A;  . FIBULA FRACTURE SURGERY Left   . MASTECTOMY Left   . RADIOLOGY WITH ANESTHESIA N/A 07/23/2016   Procedure: MRI OF BRAIN WITH AND WITHOUT;  Surgeon: Medication Radiologist, MD;  Location: [facility];  Service: Radiology;  Laterality: N/A;  . RADIOLOGY WITH ANESTHESIA N/A 09/08/2016   Procedure: MRI OF BRAIN WITH AND WITHOUT CONTRAST;  Surgeon: Medication Radiologist, MD;  Location: [facility];  Service: Radiology;  Laterality: N/A;  . right power port placement Right    Social History:  reports that she has been smoking Cigarettes.  She has been smoking about 0.50 packs per day. She has never used smokeless tobacco. She reports that she drinks alcohol. She reports that she does not use drugs.   Family History  Problem Relation Age of Onset  . Diabetes Mother   . Bipolar disorder Mother   . CAD Father      Allergies  Allergen Reactions  . Demerol Itching and Nausea And Vomiting  . Erythromycin Rash    Can take zpak     Prior to Admission medications   Medication Sig Start Date  End Date Taking? Authorizing Provider  ALPRAZolam Duanne Moron) 1 MG tablet Take 1 tablet (1 mg total) by mouth as directed. Take xanax 1mg  tab oral 30 minutes before radaition treatments and every hour sleep,dispense 60 tabs no refill 11/02/16   Hayden Pedro, PA-C  amoxicillin-clavulanate (AUGMENTIN) 875-125 MG tablet Take 1 tablet by mouth 2 (two) times daily. 11/06/16   Susanne Borders, NP  anastrozole (ARIMIDEX) 1 MG tablet Take 1 tablet (1 mg total) by mouth daily. 08/25/16   Chauncey Cruel, MD  Benzocaine-Antipyrine 55-14 MG/ML SOLN 2-4 drops each ear three times a day. Wet cotton ball with drop and place in ear post instilling drops. 12/01/16   Chauncey Cruel, MD  BIOTIN PO Take 1 capsule by mouth daily.    Historical Provider, MD  cetirizine (ZYRTEC) 10 MG tablet Take 1 tablet (10 mg total) by mouth daily. 07/14/16   Chauncey Cruel, MD  cholestyramine Lucrezia Starch) 4 g packet Take 1 packet (4 g total) by mouth 3 (three) times daily with meals. 10/27/16   Chauncey Cruel, MD  dexamethasone (DECADRON) 2 MG tablet Take 1 tablet (2 mg total) by mouth daily. 11/24/16   Sarajane Jews  C Magrinat, MD  diphenoxylate-atropine (LOMOTIL) 2.5-0.025 MG tablet Take 2 tablets by mouth 4 (four) times daily as needed for diarrhea or loose stools. 10/27/16   Chauncey Cruel, MD  fluticasone (FLONASE) 50 MCG/ACT nasal spray Place 2 sprays into both nostrils daily. 10/27/16   Chauncey Cruel, MD  gabapentin (NEURONTIN) 300 MG capsule Take 2 capsules (600 mg total) by mouth 3 (three) times daily. 300 mg at night for one day, twice daily for one day, then three times daily 11/11/16   Chauncey Cruel, MD  ibuprofen (ADVIL,MOTRIN) 200 MG tablet Take 800 mg by mouth every 6 (six) hours as needed for mild pain.    Historical Provider, MD  lamoTRIgine (LAMICTAL) 100 MG tablet Take 1.5 tablets (150 mg total) by mouth 2 (two) times daily. 11/11/16   Chauncey Cruel, MD  lidocaine-prilocaine (EMLA) cream Apply 1  application topically as needed. 06/19/16   Chauncey Cruel, MD  lithium carbonate 300 MG capsule TAKE 1 CAPSULE BY MOUTH TWICE DAILY WITH A MEAL 11/16/16   Chauncey Cruel, MD  Loperamide HCl (IMODIUM PO) Take 1 tablet by mouth as needed (diarrhea).     Historical Provider, MD  LORazepam (ATIVAN) 0.5 MG tablet Take 1 tablet (0.5 mg total) by mouth every 8 (eight) hours as needed for anxiety. 08/25/16   Chauncey Cruel, MD  methadone (DOLOPHINE) 10 MG tablet Take 1 tablet (10 mg total) by mouth every 8 (eight) hours. 12/01/16   Chauncey Cruel, MD  methadone (DOLOPHINE) 5 MG tablet Take 1 tablet (5 mg total) by mouth every 8 (eight) hours. 12/01/16   Virgie Dad Magrinat, MD  MUCINEX D 60-600 MG 12 hr tablet TAKE 1 TABLET BY MOUTH 2 TIMES DAILY FOR 1 TO 2 WEEKS AS NEEDED FOR CONGESTION. 11/18/16   Chauncey Cruel, MD  MULTIPLE VITAMIN PO Take by mouth every morning.    Historical Provider, MD  ondansetron (ZOFRAN) 8 MG tablet Take 1 tablet (8 mg total) by mouth every 8 (eight) hours as needed for nausea or vomiting. 08/17/16   Chauncey Cruel, MD  pantoprazole (PROTONIX) 40 MG tablet TAKE 1 TABLET BY MOUTH ONCE DAILY 10/28/16   Chauncey Cruel, MD  potassium chloride (K-DUR) 10 MEQ tablet Take 1 tablet (10 mEq total) by mouth daily. 07/07/16   Chauncey Cruel, MD  potassium chloride (K-DUR,KLOR-CON) 10 MEQ tablet TAKE 1 TABLET BY MOUTH ONCE DAILY 11/16/16   Chauncey Cruel, MD  tiZANidine (ZANAFLEX) 4 MG tablet Take 1 tablet (4 mg total) by mouth daily. 06/12/16   Kyung Rudd, MD  traMADol (ULTRAM) 50 MG tablet Take 1 tablet (50 mg total) by mouth every 6 (six) hours as needed. 11/06/16   Susanne Borders, NP  traZODone (DESYREL) 150 MG tablet TAKE 2 TABLETS BY MOUTH AT BEDTIME. 11/16/16   Chauncey Cruel, MD  varenicline (CHANTIX) 0.5 MG tablet Take 1 tablet (0.5 mg total) by mouth daily. 11/11/16   Chauncey Cruel, MD  Vitamin D, Cholecalciferol, 1000 units CAPS Take 1,000 Units by mouth  daily.    Historical Provider, MD  Wound Dressings (SONAFINE) Apply 1 application topically daily. Apply to head after rad txs whern skin becomes itchy or irritated,    Hayden Pedro, PA-C    Review of Systems:  Unobtainable secondary to patient's mental status  Physical Exam: Vitals:   12/03/16 1210 12/03/16 1331 12/03/16 1418 12/03/16 1631  BP:  104/68 138/77 125/74  Pulse:  103 111 93  Resp:  16 15 16   Temp:      TempSrc:      SpO2:  93% 98% 96%  Weight: 74.4 kg (164 lb)     Height: 5\' 5"  (1.651 m)      General:  A&O x 3, NAD, nontoxic, pleasant/cooperative Head/Eye: No conjunctival hemorrhage, no icterus, [place]/AT, No nystagmus ENT:  No icterus,  No thrush, good dentition, no pharyngeal exudate Neck:  No masses, no lymphadenpathy, no bruits CV:  RRR, no rub, no gallop, no S3 Lung:  CTAB, good air movement, no wheeze, no rhonchi Abdomen: soft/NT, +BS, nondistended, no peritoneal signs Ext: No cyanosis, No rashes, No petechiae, No lymphangitis, No edema Neuro: CNII-XII intact, strength 4/5 in bilateral upper and lower extremities, no dysmetria  Labs on Admission:  Basic Metabolic Panel:  Recent Labs Lab 12/03/16 1316  NA 135  K 3.3*  CL 105  CO2 16*  GLUCOSE 120*  BUN 16  CREATININE 0.75  CALCIUM 9.0   Liver Function Tests:  Recent Labs Lab 12/03/16 1316  AST 23  ALT 22  ALKPHOS 41  BILITOT 0.7  PROT 7.1  ALBUMIN 4.9   No results for input(s): LIPASE, AMYLASE in the last 168 hours.  Recent Labs Lab 12/03/16 1609  AMMONIA 20   CBC:  Recent Labs Lab 12/03/16 1316  WBC 9.9  NEUTROABS 8.5*  HGB 14.1  HCT 40.8  MCV 94.9  PLT 241   Coagulation Profile:  Recent Labs Lab 12/03/16 1316  INR 0.99   Cardiac Enzymes: No results for input(s): CKTOTAL, CKMB, CKMBINDEX, TROPONINI in the last 168 hours. BNP: Invalid input(s): POCBNP CBG: No results for input(s): GLUCAP in the last 168 hours. Urine analysis:    Component Value Date/Time     COLORURINE YELLOW 12/03/2016 1626   APPEARANCEUR HAZY (A) 12/03/2016 1626   LABSPEC 1.024 12/03/2016 1626   PHURINE 5.0 12/03/2016 1626   GLUCOSEU NEGATIVE 12/03/2016 1626   HGBUR LARGE (A) 12/03/2016 1626   BILIRUBINUR NEGATIVE 12/03/2016 1626   BILIRUBINUR negative 03/28/2015 1321   KETONESUR 80 (A) 12/03/2016 1626   PROTEINUR 30 (A) 12/03/2016 1626   UROBILINOGEN 0.2 03/28/2015 1321   UROBILINOGEN 0.2 04/18/2013 1222   NITRITE NEGATIVE 12/03/2016 1626   LEUKOCYTESUR NEGATIVE 12/03/2016 1626   Sepsis Labs: @LABRCNTIP (procalcitonin:4,lacticidven:4) )No results found for this or any previous visit (from the past 240 hour(s)).   Radiological Exams on Admission: Ct Head Wo Contrast  Result Date: 12/03/2016 CLINICAL DATA:  Increased altered mental status, fell 3 days ago. History of breast cancer with metastasis to the brain. EXAM: CT HEAD WITHOUT CONTRAST TECHNIQUE: Contiguous axial images were obtained from the base of the skull through the vertex without intravenous contrast. COMPARISON:  MRI from 09/08/2016.  Sore FINDINGS: Brain: No evidence of acute infarction, hemorrhage, hydrocephalus, nor extra-axial collection. Suboccipital postoperative change involving the left skull base. Tiny focus of hypodensity in the left cerebellar hemisphere consistent with a tiny focus of postoperative encephalomalacia. No new intra-axial mass like abnormality identified. Vascular: Nonacute Skull: Postop change left skullbase. Sinuses/Orbits: Nonacute. Other: None IMPRESSION: Left suboccipital postoperative change with tiny focus of encephalomalacia at the site of prior cerebellar metastasis. No acute intracranial abnormality identified. Electronically Signed   By: Ashley Royalty M.D.   On: 12/03/2016 15:32    EKG: Independently reviewed. Sinus tachycardia, nonspecific ST changes    Time spent:60 minutes Code Status:   FULL--confirmed with sister Family Communication:  Sister updated on  phone Disposition  Plan: expect 3-4 day hospitalization Consults called: none DVT Prophylaxis: [place] Lovenox  Lorenna Lurry, DO  [facility] Pager [phone]  If 7PM-7AM, please contact night-coverage [email or web address] Password [email or web address] 12/03/2016, 6:36 PM

## 2016-12-03 NOTE — ED Notes (Signed)
Pt is refusing to be put on the monitor. Pt states "i do not even know why someone called EMS to come and get me"

## 2016-12-03 NOTE — Progress Notes (Signed)
Pt with AMS Not responding to ED CM questions, staring noted  Confirmed with ED RN pt has had AMS since admission RN reports an african [place] female came to visit and left stating pt and he live a facility and she is the Network engineer  ED CM noted CM consult from admission RN for possible need for home health ED CM spoke briefly with EDP, Pfeiffer about consult  ED CM placed in a SW consult to have SW also to follow pt for possible placement ? Hospice or snf pending further evaluation during hospitalization

## 2016-12-03 NOTE — ED Notes (Signed)
Bed: RESA Expected date:  Expected time:  Means of arrival:  Comments: EMS- 56yo F, AMS/brain and breast CA

## 2016-12-03 NOTE — ED Triage Notes (Addendum)
Per EMS, Pt, from [place], c/o increasing AMS and diarrhea x unknown amount of time and fall x 3 days ago.  Denies pain.  EMS reports "old" hematoma on L humerus and Pt was covered in diarrhea.  Hx of breast CA w/ mets to brain.  Last treatment believed to be in July 2017, per roommate.  Pt presents very anxious.  Pt has significant psych Hx.    Pt, currently, refusing further treatment from ED staff.  EMS reports Pt removed CT en route.

## 2016-12-03 NOTE — Progress Notes (Signed)
Patient admitted from ED, only responsive minimally to certain questions.  Unable to perform admission history.  In and out cath performed, per MD order.

## 2016-12-03 NOTE — ED Notes (Signed)
Dailynn Manera POA - [phone]

## 2016-12-03 NOTE — Telephone Encounter (Signed)
This RN spoke with pt per call from [facility] stating prescription for ear drops are no longer manufactured - as well as no other known equivalence.  Pt stated concern due to " I just feel like something is wrong "   " I just want to go home "  Per further conversation about pt's statements - [name]'s feels overwhelmed by on going symptoms and " I just want to feel better " which is what she means by " going home " - she would like to feel like she did before she was diagnosed with breast cancer.  She does not have greater support in [place].  Pt understands the MRI ordered at present cannot be done until 1/11 due to her request to be done under sedation.  This RN validated pt's feelings and concerns.  She verbalized understanding that she can contact this office as needed for her concerns as well as goal is to evaluate her symptoms for management and recovery.  This note will be forwarded to support services for any additional resources that may be beneficial for this patient.

## 2016-12-04 LAB — CBC
HCT: 37.1 % (ref 36.0–46.0)
Hemoglobin: 12.8 g/dL (ref 12.0–15.0)
MCH: 32.9 pg (ref 26.0–34.0)
MCHC: 34.5 g/dL (ref 30.0–36.0)
MCV: 95.4 fL (ref 78.0–100.0)
Platelets: 204 10*3/uL (ref 150–400)
RBC: 3.89 MIL/uL (ref 3.87–5.11)
RDW: 12.9 % (ref 11.5–15.5)
WBC: 7.1 10*3/uL (ref 4.0–10.5)

## 2016-12-04 LAB — BASIC METABOLIC PANEL
Anion gap: 8 (ref 5–15)
BUN: 13 mg/dL (ref 6–20)
CO2: 22 mmol/L (ref 22–32)
Calcium: 8.3 mg/dL — ABNORMAL LOW (ref 8.9–10.3)
Chloride: 111 mmol/L (ref 101–111)
Creatinine, Ser: 0.67 mg/dL (ref 0.44–1.00)
GFR calc Af Amer: >60 mL/min (ref >60)
GFR calc non Af Amer: >60 mL/min (ref >60)
Glucose, Bld: 109 mg/dL — ABNORMAL HIGH (ref 65–99)
Potassium: 3.9 mmol/L (ref 3.5–5.1)
Sodium: 141 mmol/L (ref 135–145)

## 2016-12-04 LAB — HIV ANTIBODY (ROUTINE TESTING W REFLEX): HIV Screen 4th Generation wRfx: NONREACTIVE

## 2016-12-04 LAB — RPR: RPR Ser Ql: NONREACTIVE

## 2016-12-04 LAB — MAGNESIUM: Magnesium: 2.2 mg/dL (ref 1.7–2.4)

## 2016-12-04 MED ORDER — SODIUM CHLORIDE 0.9% FLUSH: 10.0000 mL | INTRAVENOUS | Status: DC | PRN

## 2016-12-04 MED ORDER — LORAZEPAM 2 MG/ML IJ SOLN
1.0000 mg | Freq: Once | INTRAMUSCULAR | Status: AC
  Administered 2016-12-04: 1 mg via INTRAMUSCULAR
  Filled 2016-12-04: qty 1

## 2016-12-04 NOTE — Telephone Encounter (Signed)
She is currently admitted at [facility]. We will follow for needs.

## 2016-12-04 NOTE — Progress Notes (Signed)
Chaplain saw pt to assess for Advance Directive.  Pt's children at bedside.    [name] was alert, but confused.  Not able to engage in discussion about advance directives.  Two sons present understand and will alert staff if Chara is able to demonstrate competency.    Chaplain provided brief pastoral support with family at bedside.    Sons are from [place] and plan to return to hospital to support pt.     [place], [place]

## 2016-12-04 NOTE — Clinical Social Work Note (Signed)
Clinical Social Work Assessment  Patient Details  Name: [name] MRN: [id number] Date of Birth: March 11, 1961  Date of referral:  12/04/16               Reason for consult:  Other (Comment Required) (Patient presented with AMS covered in [place])                Permission sought to share information with:  Family Supports Permission granted to share information::     Name::     [name]  Agency::     Relationship::  Sisters  Contact Information:   [id number]  Housing/Transportation Living arrangements for the past 2 months:   (Recovery House) Source of Information:  Other (Comment Required) (Siblings) Patient Interpreter Needed:  None Criminal Activity/Legal Involvement Pertinent to Current Situation/Hospitalization:  No - Comment as needed Significant Relationships:  None Lives with:  Self Do you feel safe going back to the place where you live?  Yes Need for family participation in patient care:  Yes (Comment)  Care giving concerns:  Patient presented to ED with AMS.    Social Worker assessment / plan:  LCSWA attempted to talk with patient at bedside, pt. Alert to self. Patient attempted to leave during visit. LCSWA gathered collateral information from patient sisters. LCSWA Spoke with patient sister Marliss Czar, she reports  The best person to discuss pt. is pt. Sister Shannon-[phone]. She reports the patient has lives in a recovery house managed by friend Cletus. She has been living there for 4 years. She reports the patient has been deteriating over the past month-falling, confusion and lost of memory.  She reports the patient has been in contact with Cancer Nurse Navigator, going to her appointments and recently informed family she was in remission. She has an appointment for MRI follow up on Brain. Family is not sure of patient health status and hopeful to learn more about patient medial condition.   Plan: Support/ assist with resources if needed.     Employment status:    Insurance information:  Medicaid In [place] PT Recommendations:  Not assessed at this time Information / Referral to community resources:     Patient/Family's Response to care: Family is wanting to discuss POA with patient. Patient is altered in status.  Patient/Family's Understanding of and Emotional Response to Diagnosis, Current Treatment, and Prognosis: Patient does not have immediate family in area. Family is coming into to understand patient treatment and prognosis.   Emotional Assessment Appearance:  Appears stated age Attitude/Demeanor/Rapport:   (Attempting to Leave) Affect (typically observed):    Orientation:  Oriented to Self Alcohol / Substance use:  Other (Hx of substance use/ In Recovery ) Psych involvement (Current and /or in the community):  No (Comment) (Hx of mental illness)  Discharge Needs  Concerns to be addressed:  Care Coordination Readmission within the last 30 days:  No Current discharge risk:  None Barriers to Discharge:  Continued Medical Work up   [employer], LCSW 12/04/2016, 1:37 PM

## 2016-12-04 NOTE — Progress Notes (Signed)
Patient agitated, stating that she wants to leave. Pt toileted, provided drink and redirected back to chair.

## 2016-12-04 NOTE — Progress Notes (Signed)
PROGRESS NOTE    [name]  [id number] DOB: 12/20/1960 DOA: 12/03/2016 PCP: Chauncey Cruel, MD   Brief Narrative:  Patient admitted for AMS thought to be secondary to medication induced vs malignancy given hx if mets. Basic lab work up has been negative so far. Patient needs an MRI brain and no dx, she may needs  Lumbar puncture.    Assessment & Plan:   Active Problems:   Fibromyalgia   Primary cancer of lower-inner quadrant of left female breast ([place])   Brain metastasis ([place])   Delirium due to another medical condition   Acute encephalopathy   Diarrhea   Metastatic breast cancer ([place])  Acute encephalopathy -According to the patient's sister the patient has had a gradual cognitive decline over the past several weeks, but this appears to be more acute since New Year's Day. But at this time she is AAOx 4, unsure if its waxing and waning.  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 -serum B12, TSH, RPR, HIV, ammonia, UA and urine culture - all have been WNL at this time -she is positive for benzo and opiates on her UDS -Will order MRI brain without contrast at this time  -If all of the above is unremarkable, consult neurology for possible LP and further workup for paraneoplastic encephalopathy -Other considerations may include new metastasis and steroid psychosis (less likely as the patient has been on steroids for the past 3-4 months without delirium) -Unfortunately, I am again unable to reach Cletus [phone]) to discuss pt's care and to determine baseline level of functioning.  -Case management helping to determine heatlhcare POA in case of declining mental status.  -According to Dr. Virgie Dad last note, the patient is not on methadone -Lithium levels low.  -Ativan 1 mg IV every 4 hours when necessary agitation   Diarrhea -Check C. Difficile -Stool pathogen panel - but no bowel movement yet.   Metastatic breast cancer - has had  chemo/radiation in the past -continue arimidex -will likely benefit from Palliative care to determine long term goals of care.   Bipolar disorder -Holding all her psychotropic medications at this time secondary to acute encephalopathy     DVT prophylaxis: Lovenox sq Code Status: Full Family Communication:  Attempted to reach Cletus, but no response  Disposition Plan: Patient to get MRI brain and possible Lumbar puncture. Will need atleast another 1-2 days of inpatient stay  Consultants:   None   Procedures:   None  Antimicrobials:   None    Subjective: No complaints per patient at this time. Patient is AAOx4 but doesn't want to tell me where and who does she live with.   Objective: Vitals:   12/03/16 1836 12/03/16 2119 12/04/16 0558 12/04/16 1446  BP: 135/64 124/77 (!) 114/51 109/78  Pulse: (!) 105 100 (!) 105 88  Resp: 16 17 18 20   Temp: 97.7 F (36.5 C) 98.6 F (37 C) 97.5 F (36.4 C) 98.1 F (36.7 C)  TempSrc: Oral Oral Oral Oral  SpO2: 100% 99% 98% 99%  Weight:      Height:        Intake/Output Summary (Last 24 hours) at 12/04/16 1554 Last data filed at 12/04/16 1446  Gross per 24 hour  Intake           688.75 ml  Output              500 ml  Net           188.75 ml   Danley Danker  Weights   12/03/16 1210  Weight: 74.4 kg (164 lb)    Examination:  General exam: Appears calm and comfortable AAOx4 Respiratory system: Clear to auscultation. Respiratory effort normal. Cardiovascular system: S1 & S2 heard, RRR. No JVD, murmurs, rubs, gallops or clicks. No pedal edema. Gastrointestinal system: Abdomen is nondistended, soft and nontender. No organomegaly or masses felt. Normal bowel sounds heard. Central nervous system: Alert and oriented. No focal neurological deficits. Extremities: Symmetric 5 x 5 power. Skin: No rashes, lesions or ulcers Psychiatry: Judgement and insight appear normal. Mood & affect is flat    Data Reviewed:   CBC:  Recent  Labs Lab 12/03/16 1316 12/04/16 0622  WBC 9.9 7.1  NEUTROABS 8.5*  --   HGB 14.1 12.8  HCT 40.8 37.1  MCV 94.9 95.4  PLT 241 0000000   Basic Metabolic Panel:  Recent Labs Lab 12/03/16 1316 12/04/16 0622  NA 135 141  K 3.3* 3.9  CL 105 111  CO2 16* 22  GLUCOSE 120* 109*  BUN 16 13  CREATININE 0.75 0.67  CALCIUM 9.0 8.3*  MG  --  2.2   GFR: Estimated Creatinine Clearance: 80.3 mL/min (by C-G formula based on SCr of 0.67 mg/dL). Liver Function Tests:  Recent Labs Lab 12/03/16 1316  AST 23  ALT 22  ALKPHOS 41  BILITOT 0.7  PROT 7.1  ALBUMIN 4.9   No results for input(s): LIPASE, AMYLASE in the last 168 hours.  Recent Labs Lab 12/03/16 1609 12/03/16 1843  AMMONIA 20 17   Coagulation Profile:  Recent Labs Lab 12/03/16 1316  INR 0.99   Cardiac Enzymes: No results for input(s): CKTOTAL, CKMB, CKMBINDEX, TROPONINI in the last 168 hours. BNP (last 3 results) No results for input(s): PROBNP in the last 8760 hours. HbA1C: No results for input(s): HGBA1C in the last 72 hours. CBG: No results for input(s): GLUCAP in the last 168 hours. Lipid Profile: No results for input(s): CHOL, HDL, LDLCALC, TRIG, CHOLHDL, LDLDIRECT in the last 72 hours. Thyroid Function Tests:  Recent Labs  12/03/16 1843  TSH 3.693   Anemia Panel:  Recent Labs  12/03/16 1843  VITAMINB12 324   Sepsis Labs:  Recent Labs Lab 12/03/16 1336  LATICACIDVEN 0.56    No results found for this or any previous visit (from the past 240 hour(s)).       Radiology Studies: Ct Head Wo Contrast  Result Date: 12/03/2016 CLINICAL DATA:  Increased altered mental status, fell 3 days ago. History of breast cancer with metastasis to the brain. EXAM: CT HEAD WITHOUT CONTRAST TECHNIQUE: Contiguous axial images were obtained from the base of the skull through the vertex without intravenous contrast. COMPARISON:  MRI from 09/08/2016.  Sore FINDINGS: Brain: No evidence of acute infarction,  hemorrhage, hydrocephalus, nor extra-axial collection. Suboccipital postoperative change involving the left skull base. Tiny focus of hypodensity in the left cerebellar hemisphere consistent with a tiny focus of postoperative encephalomalacia. No new intra-axial mass like abnormality identified. Vascular: Nonacute Skull: Postop change left skullbase. Sinuses/Orbits: Nonacute. Other: None IMPRESSION: Left suboccipital postoperative change with tiny focus of encephalomalacia at the site of prior cerebellar metastasis. No acute intracranial abnormality identified. Electronically Signed   By: Ashley Royalty M.D.   On: 12/03/2016 15:32        Scheduled Meds: . sodium chloride   Intravenous STAT  . anastrozole  1 mg Oral Daily  . cholestyramine  4 g Oral TID WC  . enoxaparin (LOVENOX) injection  40 mg Subcutaneous Q24H  .  pantoprazole  40 mg Oral Daily  . potassium chloride  10 mEq Oral Daily  . sodium chloride flush  3 mL Intravenous Q12H   Continuous Infusions: . 0.9 % NaCl with KCl 20 mEq / L 75 mL/hr at 12/04/16 0956     LOS: 1 day    Time spent: 45 mins     Ady Heimann Arsenio Loader, MD [facility] Pager [phone]   If 7PM-7AM, please contact night-coverage [email or web address] Password [facility] 12/04/2016, 3:54 PM

## 2016-12-04 NOTE — Pre-Procedure Instructions (Signed)
    [name]  12/04/2016      [place] [place] [address] [place] [address] Phone: [phone] Fax: [phone]    Your procedure is scheduled on 12/10/16.  Report to [facility] Admitting at 6 A.M.  Call this number if you have problems the morning of surgery:  [phone]   Remember:  Do not eat food or drink liquids after midnight.  Take these medicines the morning of surgery with A SIP OF WATER --xanax,arimidex,decadrone,neurontin,lithium,methadone,protonix,chantix   Do not wear jewelry, make-up or nail polish.  Do not wear lotions, powders, or perfumes, or deoderant.  Do not shave 48 hours prior to surgery.  Men may shave face and neck.  Do not bring valuables to the hospital.  [facility] is not responsible for any belongings or valuables.  Contacts, dentures or bridgework may not be worn into surgery.  Leave your suitcase in the car.  After surgery it may be brought to your room.  For patients admitted to the hospital, discharge time will be determined by your treatment team.  Patients discharged the day of surgery will not be allowed to drive home.   Name and phone number of your driver:    Special instructions:    Please read over the following fact sheets that you were given.

## 2016-12-05 DIAGNOSIS — C50919 Malignant neoplasm of unspecified site of unspecified female breast: Secondary | ICD-10-CM

## 2016-12-05 DIAGNOSIS — M797 Fibromyalgia: Secondary | ICD-10-CM

## 2016-12-05 LAB — URINE CULTURE: Culture: NO GROWTH

## 2016-12-05 NOTE — Progress Notes (Signed)
PROGRESS NOTE    [name]  [id number] DOB: Jan 13, 1961 DOA: 12/03/2016 PCP: Chauncey Cruel, MD   Brief Narrative:  Patient admitted for AMS thought to be secondary to medication induced vs malignancy given hx if mets. Basic lab work up has been negative so far. Patient needs an MRI brain and LP thereafter. Case discussed with Dr Lindi Adie from Oncology.   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 Will attempt it once again here if not possible, she may need to be transferred to [facility] for MRI under general Sedation on Monday.  -Discussed the Need for an MRI and LP with Dr Lindi Adie who is on call for Dr [name], who will gladly help with LP and management for her cancer as needed. He  -Other considerations may include new metastasis and steroid psychosis (less likely as the patient has been on steroids for the past 3-4 months without delirium) -Case management helping to determine heatlhcare POA in case of declining mental status.  -According to Dr. Virgie Dad last note, the patient is not on methadone -Lithium levels low.  -Ativan 1 mg IV every 4 hours when necessary agitation   Diarrhea- resolved   Metastatic breast cancer - has had chemo/radiation in the past -continue arimidex -will likely benefit from Palliative care to determine long term goals of care.   Bipolar disorder -Holding all her psychotropic medications at this time  secondary to acute encephalopathy   DVT prophylaxis: Lovenox sq Code Status: Full Family Communication:  Spoke with Cletus this morning in detail as described below but no information regarding the patients care was given as he is not the POA.  Disposition Plan: Patient to get MRI brain and possible Lumbar puncture.  Consultants:   Onc, Dr Lindi Adie  Procedures:   None  Antimicrobials:   None    Subjective: This morning I called Cletus who is the manager at the [employer]. 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 Per Cletus, patient has been very sharp and top of her medical care but since Christmas she has been in usual state of mind and having intermittent episodes of confusion performing simple task. It particularly became worst since Jan 1st where she couldn't even perform her daily work task with memory loss. Even did get in an argument with some members at the [place] which the patient doesn't remember at this time therefore she was brought here.  As far as Cletus knows, she does have hx of Depression and Bipolar for which she takes "few medications" and follow up with Physicians at [facility]? He also states her cancer has been in remission as of October 2017 and she is suppose to have a follow up MRI this month.   When I saw the patient at bedside today she appears to be AAOx2-3 but when I mentioned MRI  Brain she became extremely agitated and refuses to get one done without general sedation. When the nurse contact the MRI staff at [facility], It was indeed confirmed that the patient does get it under sedation and intubated due to severe anxiety and agitation.   No other acute events overnight.    Objective: Vitals:   12/04/16 2040 12/04/16 2100 12/05/16 0619 12/05/16 1324  BP: 119/74 140/82 119/80 120/71  Pulse: 90 86 92 77  Resp: 20 20 20 20   Temp: 98.5 F  (36.9 C) 98.7 F (37.1 C) 98.6 F (37 C) 97.9 F (36.6 C)  TempSrc: Oral Oral Oral Oral  SpO2: 99% 94% 97% 99%  Weight:      Height:        Intake/Output Summary (Last 24 hours) at 12/05/16 1419 Last data filed at 12/04/16 1446  Gross per 24 hour  Intake              120 ml  Output                0 ml  Net              120 ml   Filed Weights   12/03/16 1210  Weight: 74.4 kg (164 lb)    Examination:  General exam: Appears calm and comfortable AAOx2-3 Respiratory system: Clear to auscultation. Respiratory effort normal. Cardiovascular system: S1 & S2 heard, RRR. No JVD, murmurs, rubs, gallops or clicks. No pedal edema. Gastrointestinal system: Abdomen is nondistended, soft and nontender. No organomegaly or masses felt. Normal bowel sounds heard. Central nervous system: Alert and oriented. No focal neurological deficits. Extremities: Symmetric 5 x 5 power. Skin: No rashes, lesions or ulcers Psychiatry: Judgement and insight appear normal. Mood & affect is flat    Data Reviewed:   CBC:  Recent Labs Lab 12/03/16 1316 12/04/16 0622  WBC 9.9 7.1  NEUTROABS 8.5*  --   HGB 14.1 12.8  HCT 40.8 37.1  MCV 94.9 95.4  PLT 241 0000000   Basic Metabolic Panel:  Recent Labs Lab 12/03/16 1316 12/04/16 0622  NA 135 141  K 3.3* 3.9  CL 105 111  CO2 16* 22  GLUCOSE 120* 109*  BUN 16 13  CREATININE 0.75 0.67  CALCIUM 9.0 8.3*  MG  --  2.2   GFR: Estimated Creatinine Clearance: 80.3 mL/min (by C-G formula based on SCr of 0.67 mg/dL). Liver Function Tests:  Recent Labs Lab 12/03/16 1316  AST 23  ALT 22  ALKPHOS 41  BILITOT 0.7  PROT 7.1  ALBUMIN 4.9   No results for input(s): LIPASE, AMYLASE in the last 168 hours.  Recent Labs Lab 12/03/16 1609 12/03/16 1843  AMMONIA 20 17   Coagulation Profile:  Recent Labs Lab 12/03/16 1316  INR 0.99   Cardiac Enzymes: No results for input(s): CKTOTAL, CKMB, CKMBINDEX, TROPONINI in the last 168 hours. BNP (last  3 results) No results for input(s): PROBNP in the last 8760 hours. HbA1C: No results for input(s): HGBA1C in the last 72 hours. CBG: No results for input(s): GLUCAP in the last 168 hours. Lipid Profile: No results for input(s): CHOL, HDL, LDLCALC, TRIG, CHOLHDL, LDLDIRECT in the last 72 hours. Thyroid Function Tests:  Recent Labs  12/03/16 1843  TSH 3.693   Anemia Panel:  Recent Labs  12/03/16 1843  VITAMINB12 324   Sepsis Labs:  Recent Labs Lab 12/03/16 1336  LATICACIDVEN 0.56    Recent Results (from the past 240 hour(s))  Urine culture  Status: None   Collection Time: 12/03/16  6:30 PM  Result Value Ref Range Status   Specimen Description URINE, RANDOM  Final   Special Requests NONE  Final   Culture NO GROWTH Performed at [facility]   Final   Report Status 12/05/2016 FINAL  Final         Radiology Studies: Ct Head Wo Contrast  Result Date: 12/03/2016 CLINICAL DATA:  Increased altered mental status, fell 3 days ago. History of breast cancer with metastasis to the brain. EXAM: CT HEAD WITHOUT CONTRAST TECHNIQUE: Contiguous axial images were obtained from the base of the skull through the vertex without intravenous contrast. COMPARISON:  MRI from 09/08/2016.  Sore FINDINGS: Brain: No evidence of acute infarction, hemorrhage, hydrocephalus, nor extra-axial collection. Suboccipital postoperative change involving the left skull base. Tiny focus of hypodensity in the left cerebellar hemisphere consistent with a tiny focus of postoperative encephalomalacia. No new intra-axial mass like abnormality identified. Vascular: Nonacute Skull: Postop change left skullbase. Sinuses/Orbits: Nonacute. Other: None IMPRESSION: Left suboccipital postoperative change with tiny focus of encephalomalacia at the site of prior cerebellar metastasis. No acute intracranial abnormality identified. Electronically Signed   By: Ashley Royalty M.D.   On: 12/03/2016 15:32         Scheduled Meds: . anastrozole  1 mg Oral Daily  . cholestyramine  4 g Oral TID WC  . enoxaparin (LOVENOX) injection  40 mg Subcutaneous Q24H  . pantoprazole  40 mg Oral Daily  . potassium chloride  10 mEq Oral Daily  . sodium chloride flush  3 mL Intravenous Q12H   Continuous Infusions: . 0.9 % NaCl with KCl 20 mEq / L 75 mL/hr at 12/05/16 1209     LOS: 2 days    Time spent: 45 mins     Taraji Mungo Arsenio Loader, MD [facility] Pager [phone]   If 7PM-7AM, please contact night-coverage [email or web address] Password [email or web address] 12/05/2016, 2:19 PM

## 2016-12-06 DIAGNOSIS — Z833 Family history of diabetes mellitus: Secondary | ICD-10-CM

## 2016-12-06 DIAGNOSIS — F4322 Adjustment disorder with anxiety: Secondary | ICD-10-CM

## 2016-12-06 DIAGNOSIS — F17210 Nicotine dependence, cigarettes, uncomplicated: Secondary | ICD-10-CM

## 2016-12-06 DIAGNOSIS — C7931 Secondary malignant neoplasm of brain: Secondary | ICD-10-CM

## 2016-12-06 DIAGNOSIS — C50312 Malignant neoplasm of lower-inner quadrant of left female breast: Secondary | ICD-10-CM

## 2016-12-06 DIAGNOSIS — C7932 Secondary malignant neoplasm of cerebral meninges: Secondary | ICD-10-CM

## 2016-12-06 DIAGNOSIS — R197 Diarrhea, unspecified: Secondary | ICD-10-CM

## 2016-12-06 DIAGNOSIS — C7951 Secondary malignant neoplasm of bone: Secondary | ICD-10-CM

## 2016-12-06 DIAGNOSIS — Z818 Family history of other mental and behavioral disorders: Secondary | ICD-10-CM

## 2016-12-06 DIAGNOSIS — G9340 Encephalopathy, unspecified: Secondary | ICD-10-CM

## 2016-12-06 DIAGNOSIS — Z79899 Other long term (current) drug therapy: Secondary | ICD-10-CM

## 2016-12-06 DIAGNOSIS — Z888 Allergy status to other drugs, medicaments and biological substances status: Secondary | ICD-10-CM

## 2016-12-06 DIAGNOSIS — F05 Delirium due to known physiological condition: Secondary | ICD-10-CM

## 2016-12-06 NOTE — Progress Notes (Signed)
The pt. Pulled her IV out and is refusing to let me place another one.

## 2016-12-06 NOTE — Progress Notes (Signed)
HEMATOLOGY-ONCOLOGY PROGRESS NOTE  SUBJECTIVE: Metastatic breast cancer ER/PR positive and HER-2 positive disease originally treated with left mastectomy with axillary lymph node dissection, felt to be a stage IIIB but later on scans revealed metastatic disease and she received Taxotere with Herceptin and Perjeta followed by Herceptin and Perjeta maintenance. Treatment was changed to anastrozole Herceptin starting October 2017. And on the most recent visit in November 2017 Dr. Jana Hunter added low-dose Perjeta. She also received Xgeva for bone metastases.  MRI brain done on 09/08/2016 suggested early leptomeningeal involvement and she received whole brain radiation 35 gray in 14 fractions. She was supposed to have a repeat MRI versus lumbar puncture. Recently she started becoming confused, combative and a change in mental status was noted. CT of the head done in the hospital revealed no evidence of any intracranial metastases. However CT is not capable of identifying leptomeningeal involvement.  The nurses report that the mental status has improved today compared to yesterday. I was asked to see her urgently to discuss performing a lumbar puncture for evaluation regarding leptomeningeal carcinomatosis. She could not get the regular MRI because she has to be sedated.  OBJECTIVE: REVIEW OF SYSTEMS:   Constitutional: Denies fevers, chills or abnormal weight loss Eyes: Denies blurriness of vision Ears, nose, mouth, throat, and face: Denies mucositis or sore throat Respiratory: Denies cough, dyspnea or wheezes Cardiovascular: Denies palpitation, chest discomfort Gastrointestinal:  Denies nausea, heartburn or change in bowel habits Skin: Denies abnormal skin rashes Lymphatics: Denies new lymphadenopathy or easy bruising Neurological: Confusion to time place and person Behavioral/Psych: Mood is stable, no new changes  Extremities: No lower extremity edema  All other systems were reviewed with the  patient and are negative.  I have reviewed the past medical history, past surgical history, social history and family history with the patient and they are unchanged from previous note.   PHYSICAL EXAMINATION: ECOG PERFORMANCE STATUS: 2 - Symptomatic, <50% confined to bed  Vitals:   12/05/16 2200 12/06/16 0646  BP: 129/84 119/80  Pulse: (!) 106 95  Resp:  18  Temp: 98.4 F (36.9 C) 98.1 F (36.7 C)   Filed Weights   12/03/16 1210  Weight: 164 lb (74.4 kg)    GENERAL:alert, no distress and comfortable SKIN: skin color, texture, turgor are normal, no rashes or significant lesions EYES: normal, Conjunctiva are pink and non-injected, sclera clear OROPHARYNX:no exudate, no erythema and lips, buccal mucosa, and tongue normal  NECK: supple, thyroid normal size, non-tender, without nodularity LYMPH:  no palpable lymphadenopathy in the cervical, axillary or inguinal LUNGS: clear to auscultation and percussion with normal breathing effort HEART: regular rate & rhythm and no murmurs and no lower extremity edema ABDOMEN:abdomen soft, non-tender and normal bowel sounds Musculoskeletal:no cyanosis of digits and no clubbing  NEURO: Confused, disoriented to time place and person but answers questions appropriately and follows commands.  LABORATORY DATA:  I have reviewed the data as listed CMP Latest Ref Rng & Units 12/04/2016 12/03/2016 11/17/2016  Glucose 65 - 99 mg/dL 109(H) 120(H) 115  BUN 6 - 20 mg/dL 13 16 11.9  Creatinine 0.44 - 1.00 mg/dL 0.67 0.75 1.0  Sodium 135 - 145 mmol/L 141 135 138  Potassium 3.5 - 5.1 mmol/L 3.9 3.3(L) 4.3  Chloride 101 - 111 mmol/L 111 105 -  CO2 22 - 32 mmol/L 22 16(L) 27  Calcium 8.9 - 10.3 mg/dL 8.3(L) 9.0 10.0  Total Protein 6.5 - 8.1 g/dL - 7.1 7.1  Total Bilirubin 0.3 - 1.2 mg/dL -  0.7 0.39  Alkaline Phos 38 - 126 U/L - 41 46  AST 15 - 41 U/L - 23 17  ALT 14 - 54 U/L - 22 24    Lab Results  Component Value Date   WBC 7.1 12/04/2016   HGB 12.8  12/04/2016   HCT 37.1 12/04/2016   MCV 95.4 12/04/2016   PLT 204 12/04/2016   NEUTROABS 8.5 (H) 12/03/2016    ASSESSMENT AND PLAN: 1. 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 MRI cannot be done because she needs conscious sedation which apparently only happens at [facility] on Tuesdays and Thursdays. So she may need to be transferred over to [facility] for that.  I discussed with her about the role of lumbar puncture and the procedure and the process involved. Patient did not want to undergo the procedure. I tried to inquire with the power of attorney for healthcare decisions is but she was not able to answer those questions. Apparently her son has not been answering the phone calls. She apparently also hung up on her sister's phone call.  2. her systemic disease appears to be under reasonable control based upon the CT scans done on 11/10/2016 3. Bone metastases: T6 and T9 vertebra 4. 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 Dr. Jana Hunter will follow her from tomorrow.

## 2016-12-06 NOTE — Progress Notes (Signed)
PROGRESS NOTE    [name]  [id number] DOB: December 25, 1960 DOA: 12/03/2016 PCP: Chauncey Cruel, MD   Brief Narrative:  Patient admitted for AMS thought to be secondary to medication induced vs malignancy given hx if mets. Basic lab work up has been negative so far. Patient needs an MRI brain and LP thereafter but patient is very reluctant but has episodes of confusion. Psych has been consulted.   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 Will attempt it once again here if not possible, she may need to be transferred to [facility] for MRI under general Sedation.. -Discussed the Need for an MRI and LP with Dr Lindi Adie who is on call for Dr [name], who will gladly help with LP and management for her cancer as needed.  -Other considerations may include new metastasis and steroid psychosis (less likely as the patient has been on steroids for the past 3-4 months without delirium) -Case management helping to determine heatlhcare POA in case of declining mental status.  -According to Dr. Virgie Dad last note, the patient is not on methadone -Lithium levels low.  -Ativan 1 mg IV every 4 hours when necessary agitation -I have also consulted Psych to assist wit deciding her mental capacity.   Diarrhea- improved   Metastatic breast cancer - has had chemo/radiation in the past -continue arimidex -will likely benefit from Palliative care to determine long  term goals of care.   Bipolar disorder -Holding all her psychotropic medications at this time secondary to acute encephalopathy -psych consulted, awaiting input.    DVT prophylaxis: Lovenox sq Code Status: Full Family Communication:  Awaiting family to call back  Disposition Plan: Patient to get MRI brain and possible Lumbar puncture. May require to transfer her to [facility]. At this time she is refusing all, Psych has been consulted for eval to assist with determining her mental capacity  Consultants:   Onc, Dr Lindi Adie  Psych   Procedures:   None  Antimicrobials:   None    Subjective: At this time patient denies any complaints and is refusing any test to be done. I am not if she is fully capable of making decisions for herself.  She claims she did not get dinner tray last night and has not been getting anything to drink but I do water laying around her. She is able to tell me her full name, where she is and remote hx of her past. Still doesn't know what brought her to the hospital.   Objective: Vitals:   12/05/16 0619 12/05/16 1324 12/05/16 2200 12/06/16 0646  BP: 119/80 120/71 129/84 119/80  Pulse: 92 77 (!) 106 95  Resp: 20 20  18   Temp: 98.6 F (37 C) 97.9 F (36.6 C) 98.4 F (36.9 C) 98.1 F (36.7 C)  TempSrc: Oral Oral Oral Oral  SpO2: 97% 99% 100% 96%  Weight:      Height:  Intake/Output Summary (Last 24 hours) at 12/06/16 0937 Last data filed at 12/06/16 0646  Gross per 24 hour  Intake              240 ml  Output                0 ml  Net              240 ml   Filed Weights   12/03/16 1210  Weight: 74.4 kg (164 lb)    Examination:  General exam: Appears calm and comfortable AAOx2 Respiratory system: Clear to auscultation. Respiratory effort normal. Cardiovascular system: S1 & S2 heard, RRR. No JVD, murmurs, rubs, gallops or clicks. No pedal edema. Gastrointestinal system: Abdomen is nondistended, soft and nontender. No organomegaly or  masses felt. Normal bowel sounds heard. Central nervous system: Alert and oriented. No focal neurological deficits. Extremities: Symmetric 5 x 5 power. Skin: No rashes, lesions or ulcers Psychiatry: Judgement and insight appear normal. Mood & affect is flat    Data Reviewed:   CBC:  Recent Labs Lab 12/03/16 1316 12/04/16 0622  WBC 9.9 7.1  NEUTROABS 8.5*  --   HGB 14.1 12.8  HCT 40.8 37.1  MCV 94.9 95.4  PLT 241 0000000   Basic Metabolic Panel:  Recent Labs Lab 12/03/16 1316 12/04/16 0622  NA 135 141  K 3.3* 3.9  CL 105 111  CO2 16* 22  GLUCOSE 120* 109*  BUN 16 13  CREATININE 0.75 0.67  CALCIUM 9.0 8.3*  MG  --  2.2   GFR: Estimated Creatinine Clearance: 80.3 mL/min (by C-G formula based on SCr of 0.67 mg/dL). Liver Function Tests:  Recent Labs Lab 12/03/16 1316  AST 23  ALT 22  ALKPHOS 41  BILITOT 0.7  PROT 7.1  ALBUMIN 4.9   No results for input(s): LIPASE, AMYLASE in the last 168 hours.  Recent Labs Lab 12/03/16 1609 12/03/16 1843  AMMONIA 20 17   Coagulation Profile:  Recent Labs Lab 12/03/16 1316  INR 0.99   Cardiac Enzymes: No results for input(s): CKTOTAL, CKMB, CKMBINDEX, TROPONINI in the last 168 hours. BNP (last 3 results) No results for input(s): PROBNP in the last 8760 hours. HbA1C: No results for input(s): HGBA1C in the last 72 hours. CBG: No results for input(s): GLUCAP in the last 168 hours. Lipid Profile: No results for input(s): CHOL, HDL, LDLCALC, TRIG, CHOLHDL, LDLDIRECT in the last 72 hours. Thyroid Function Tests:  Recent Labs  12/03/16 1843  TSH 3.693   Anemia Panel:  Recent Labs  12/03/16 1843  VITAMINB12 324   Sepsis Labs:  Recent Labs Lab 12/03/16 1336  LATICACIDVEN 0.56    Recent Results (from the past 240 hour(s))  Urine culture     Status: None   Collection Time: 12/03/16  6:30 PM  Result Value Ref Range Status   Specimen Description URINE, RANDOM  Final   Special Requests NONE  Final    Culture NO GROWTH Performed at [facility]   Final   Report Status 12/05/2016 FINAL  Final         Radiology Studies: No results found.      Scheduled Meds: . anastrozole  1 mg Oral Daily  . cholestyramine  4 g Oral TID WC  . enoxaparin (LOVENOX) injection  40 mg Subcutaneous Q24H  . pantoprazole  40 mg Oral Daily  . potassium chloride  10 mEq Oral Daily  . sodium chloride flush  3 mL Intravenous Q12H  Continuous Infusions: . 0.9 % NaCl with KCl 20 mEq / L 75 mL/hr at 12/05/16 1209     LOS: 3 days    Time spent: 45 mins     Fiona Coto Arsenio Loader, MD [facility] Pager [phone]   If 7PM-7AM, please contact night-coverage [email or web address] Password [facility] 12/06/2016, 9:37 AM

## 2016-12-06 NOTE — Consult Note (Signed)
[name]   Reason for Hunter:  Capacity determination Referring Physician: Dr. Reesa Chew Patient Identification: Am…Lie Dean MRN:  [id number] Principal Diagnosis: Adjustment disorder with anxiety Diagnosis:   Patient Active Problem List   Diagnosis Date Noted  . Adjustment disorder with anxiety [F43.22] 12/06/2016  . Delirium due to another medical condition [F05] 12/03/2016  . Acute encephalopathy [G93.40] 12/03/2016  . Diarrhea [R19.7] 12/03/2016  . Metastatic breast cancer ([place]) [C50.919]   . Cerumen impaction [H61.20] 11/25/2016  . Otitis media [H66.90] 11/06/2016  . Brain metastasis ([place]) [C79.31] 07/27/2016  . Iron deficiency anemia [D50.9] 06/26/2016  . Bone metastases ([place]) [C79.51] 06/03/2016  . Primary cancer of lower-inner quadrant of left female breast ([place]) [C50.312] 06/01/2016  . Pap smear for cervical cancer screening [Z12.4] 03/28/2015  . Current smoker [F17.200] 03/28/2015  . Healthcare maintenance [Z00.00] 03/28/2015  . Seasonal allergies [J30.2] 03/28/2015  . Anxiety state [F41.1] 02/28/2015  . Fibromyalgia [M79.7] 02/28/2015  . Family history of diabetes mellitus [Z83.3] 02/28/2015  . H/O alcohol abuse [Z87.898]     Total Time spent with patient: 45 minutes  Subjective:   [name] is a 56 y.o. female patient admitted with confusion.  HPI: Thanks for asking me to do a psychiatric Hunter on [name].  Patient  reports prior history of Bipolar disorder for which she is taking medications. She reports that she has been seeing her psychiatrist regularly and does not need another one. Patient is alert and oriented. She states that she did not refuse further intervention for her current medical issues but verbalizes that she is simply anxious about getting a Lumbar puncture. She denies delusions, psychosis, suicidal and homicidal thoughts. She states that she understands the needs to comply with treatment and the consequences of refusing  treatment.  However, she wants her doctors to understand that she gets overwhelmed from time to time with her multiple medical problem. In conclusion, she is willing to accept the treatment offered to her.  Past Psychiatric History: as above.  Risk to Self: Is patient at risk for suicide?: No (unable to assess/obtain) Risk to Others:   Prior Inpatient Therapy:   Prior Outpatient Therapy:    Past Medical History:  Past Medical History:  Diagnosis Date  . Alcohol abuse   . Anemia    during chemo  . Anxiety    At age 58  . Arthritis Dx 2010  . Bipolar disorder ([place])   . Cancer ([place])    breast mets to brain  . Chronic pain   . Complication of anesthesia   . Depression   . Fibromyalgia Dx 2005  . GERD (gastroesophageal reflux disease)   . Headache    hx  migraines  . Opiate dependence ([place])   . PONV (postoperative nausea and vomiting)   . Port-a-cath in place   . PTSD (post-traumatic stress disorder)     Past Surgical History:  Procedure Laterality Date  . APPLICATION OF CRANIAL NAVIGATION N/A 08/14/2016   Procedure: APPLICATION OF CRANIAL NAVIGATION;  Surgeon: Erline Levine, MD;  Location: [place] [facility]URO ORS;  Service: Neurosurgery;  Laterality: N/A;  . BREAST RECONSTRUCTION Left    with silicone implant  . CRANIOTOMY N/A 08/14/2016   Procedure: CRANIOTOMY TUMOR EXCISION WITH Lucky Rathke;  Surgeon: Erline Levine, MD;  Location: [place] NEURO ORS;  Service: Neurosurgery;  Laterality: N/A;  . FIBULA FRACTURE SURGERY Left   . MASTECTOMY Left   . RADIOLOGY WITH ANESTHESIA N/A 07/23/2016   Procedure: MRI OF BRAIN WITH  AND WITHOUT;  Surgeon: Medication Radiologist, MD;  Location: [place];  Service: Radiology;  Laterality: N/A;  . RADIOLOGY WITH ANESTHESIA N/A 09/08/2016   Procedure: MRI OF BRAIN WITH AND WITHOUT CONTRAST;  Surgeon: Medication Radiologist, MD;  Location: [place];  Service: Radiology;  Laterality: N/A;  . right power port placement Right    Family History:  Family History  Problem  Relation Age of Onset  . Diabetes Mother   . Bipolar disorder Mother   . CAD Father    Family Psychiatric  History:  Social History:  History  Alcohol Use  . Yes    Comment: no ETOH since 08/22/12     History  Drug Use No    Comment: prescription opiates from the street daily until Sunday    Social History   Social History  . Marital status: Single    Spouse name: N/A  . Number of children: N/A  . Years of education: N/A   Social History Main Topics  . Smoking status: Current Every Day Smoker    Packs/day: 0.50    Types: Cigarettes  . Smokeless tobacco: Never Used     Comment: Pt is on Chantix at present time  . Alcohol use Yes     Comment: no ETOH since 08/22/12  . Drug use: No     Comment: prescription opiates from the street daily until Sunday  . Sexual activity: No     Comment: ablation   Other Topics Concern  . None   Social History Narrative  . None   Additional Social History:    Allergies:   Allergies  Allergen Reactions  . Demerol Itching and Nausea And Vomiting  . Erythromycin Rash    Can take zpak    Labs: No results found for this or any previous visit (from the past 48 hour(s)).  Current Facility-Administered Medications  Medication Dose Route Frequency Provider Last Rate Last Dose  . 0.9 % NaCl with KCl 20 mEq/ L  infusion   Intravenous Continuous Orson Eva, MD 75 mL/hr at 12/05/16 1209    . acetaminophen (TYLENOL) tablet 650 mg  650 mg Oral Q6H PRN Orson Eva, MD   650 mg at 12/04/16 1027   Or  . acetaminophen (TYLENOL) suppository 650 mg  650 mg Rectal Q6H PRN Orson Eva, MD      . anastrozole (ARIMIDEX) tablet 1 mg  1 mg Oral Daily Orson Eva, MD   1 mg at 12/05/16 1047  . cholestyramine (QUESTRAN) packet 4 g  4 g Oral TID WC Orson Eva, MD      . enoxaparin (LOVENOX) injection 40 mg  40 mg Subcutaneous Q24H Orson Eva, MD   40 mg at 12/05/16 2210  . LORazepam (ATIVAN) injection 1 mg  1 mg Intravenous Q4H PRN Orson Eva, MD   1 mg at 12/05/16  2210  . ondansetron (ZOFRAN) tablet 4 mg  4 mg Oral Q6H PRN Orson Eva, MD       Or  . ondansetron [facility]) injection 4 mg  4 mg Intravenous Q6H PRN Orson Eva, MD   4 mg at 12/04/16 0501  . pantoprazole (PROTONIX) EC tablet 40 mg  40 mg Oral Daily Orson Eva, MD   40 mg at 12/05/16 1046  . potassium chloride (K-DUR,KLOR-CON) CR tablet 10 mEq  10 mEq Oral Daily Orson Eva, MD   10 mEq at 12/05/16 1046  . sodium chloride flush (NS) 0.9 % injection 10-40 mL  10-40 mL Intracatheter PRN  Ankit Arsenio Loader, MD      . sodium chloride flush (NS) 0.9 % injection 3 mL  3 mL Intravenous Q12H Orson Eva, MD   3 mL at 12/05/16 1047    Musculoskeletal: Strength & Muscle Tone: not tested Gait & Station: not tested Patient leans: N/A  Psychiatric Specialty Exam: Physical Exam  Psychiatric: Her speech is normal and behavior is normal. Judgment and thought content normal. Her mood appears anxious. Cognition and memory are normal.    Review of Systems  Constitutional: Positive for malaise/fatigue.  HENT: Negative.   Eyes: Negative.   Respiratory: Negative.   Cardiovascular: Negative.   Gastrointestinal: Positive for nausea.  Genitourinary: Negative.   Skin: Negative.   Neurological: Negative.   Psychiatric/Behavioral: The patient is nervous/anxious.     Blood pressure 119/80, pulse 95, temperature 98.1 F (36.7 C), temperature source Oral, resp. rate 18, height 5\' 5"  (1.651 m), weight 74.4 kg (164 lb), SpO2 96 %.Body mass index is 27.29 kg/m.  General Appearance: Casual  Eye Contact:  Good  Speech:  Clear and Coherent  Volume:  Normal  Mood:  Anxious  Affect:  Appropriate  Thought Process:  Coherent  Orientation:  Full (Time, Place, and Person)  Thought Content:  Logical  Suicidal Thoughts:  No  Homicidal Thoughts:  No  Memory:  Immediate;   Fair Recent;   Fair Remote;   Fair  Judgement:  Fair  Insight:  Present  Psychomotor Activity:  Normal  Concentration:  Concentration: Fair and  Attention Span: Fair  Recall:  [employer] of Knowledge:  Fair  Language:  Good  Akathisia:  No  Handed:  Right  AIMS (if indicated):     Assets:  Communication Skills Desire for Improvement  ADL's:  Intact  Cognition:  WNL  Sleep:   fair     Treatment Plan Summary: Assessment: 56 year old woman with history of Bipolar disorder who denies current symptoms of Bipolar. Patient is alert and oriented and verbalizes understanding of the need to cooperate and getting treatment necessary for her medical issues. Patient has capacity to make her own medical decision at this point.  Plan/Recomendation: 1. Contact family to enquire about health care proxy for the patient. 2. Re-Hunter psych in the future if necessary  Disposition: No evidence of imminent risk to self or others at present.   Patient does not meet criteria for psychiatric inpatient admission. Supportive therapy provided about ongoing stressors.  Corena Pilgrim, MD 12/06/2016 12:40 PM

## 2016-12-07 ENCOUNTER — Inpatient Hospital Stay (HOSPITAL_COMMUNITY)
Admission: RE | Admit: 2016-12-07 | Discharge: 2016-12-07 | Disposition: A | Discharge status: Home / Self Care | Payer: Self-pay | Source: Ambulatory Visit

## 2016-12-07 LAB — C DIFFICILE QUICK SCREEN W PCR REFLEX
C Diff antigen: NEGATIVE
C Diff interpretation: NOT DETECTED
C Diff toxin: NEGATIVE

## 2016-12-07 MED ORDER — LORAZEPAM 0.5 MG PO TABS
0.5000 mg | ORAL_TABLET | Freq: Once | ORAL | Status: AC
  Administered 2016-12-08: 0.5 mg via ORAL
  Filled 2016-12-07 (×2): qty 1

## 2016-12-07 MED ORDER — LORAZEPAM 1 MG PO TABS
1.0000 mg | ORAL_TABLET | Freq: Once | ORAL | Status: AC
  Administered 2016-12-07: 1 mg via ORAL
  Filled 2016-12-07: qty 1

## 2016-12-07 MED ORDER — LORAZEPAM 1 MG PO TABS
1.0000 mg | ORAL_TABLET | Freq: Four times a day (QID) | ORAL | Status: DC | PRN
  Administered 2016-12-07 (×2): 1 mg via ORAL
  Filled 2016-12-07 (×2): qty 1

## 2016-12-07 NOTE — Progress Notes (Signed)
Pt. Leads off. Went in the room and the pt. was lying in bed naked. She had took off her gown and tele box and threw it in the floor and had also stooled all over the floor in the room and in the bathroom. She continues to be resistant to care stating "just let me sleep" when I attempted to put the telemetry and her gown back on.

## 2016-12-07 NOTE — Progress Notes (Signed)
Report called to [facility] at [facility]

## 2016-12-07 NOTE — Progress Notes (Signed)
PROGRESS NOTE    [name]  [id number] DOB: Nov 10, 1961 DOA: 12/03/2016 PCP: Shannon Cruel, MD   Brief Narrative:  Patient admitted for AMS thought to be secondary to medication induced vs malignancy given hx if mets. Basic lab work up has been negative so far. Patient needs MRI and LP under general sedation.   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 Will have to transfer her to [place] for it to happen. She is on the schedule for it on Jan 11th 8am. Appreciate his input.  -Other considerations may include new metastasis and steroid psychosis (less likely as the patient has been on steroids for the past 3-4 months without delirium) -Lithium levels low.  -Ativan 1 mg IV changed to po for anxiety  -Psych input appreciated in term of capacity of decision making. Will need their input in regards to her psych meds   Diarrhea- improved   Metastatic breast cancer - has had chemo/radiation in the past -continue arimidex -will likely benefit from Palliative care to determine long term goals of care.   Bipolar disorder -Holding all her psychotropic medications at this time secondary to acute encephalopathy -psych consulted, awaiting input.    DVT prophylaxis: Lovenox sq Code Status: Full Family Communication:  Spoke with [name] (her son regarding her medical condition with her permission). All the questions answered.    Disposition Plan: Patient to get MRI brain and possible Lumbar puncture. Require to transfer her to [facility]. At this time she is refusing all. Consultants:   Onc, [name]/[name]   Psych   Procedures:   None  Antimicrobials:   None    Subjective: At this time patient denies any complaints. She is refusing any medical treatment at this time here at [facility] but ok with getting LP and MRI done at [facility] under general sedation.  Objective: Vitals:   12/06/16 0646 12/06/16 1402 12/06/16 2129 12/07/16 0635  BP: 119/80 126/89 (!) 107/59 111/70  Pulse: 95 96 88 93  Resp: 18 18 16 17   Temp: 98.1 F (36.7 C) 98.2 F (36.8 C) 98.3 F (36.8 C) 98.4 F (36.9 C)  TempSrc: Oral Oral Oral Oral  SpO2: 96% 100% 100% 97%  Weight:      Height:        Intake/Output Summary (Last 24 hours) at 12/07/16 1322 Last data filed at 12/07/16 0708  Gross per 24 hour  Intake             1595 ml  Output             1950 ml  Net             -355 ml   Filed Weights   12/03/16 1210  Weight: 74.4 kg (164 lb)    Examination:  General exam: Appears calm and comfortable AAOx4 Respiratory system: Clear to auscultation. Respiratory effort normal. Cardiovascular system: S1 & S2 heard, RRR. No JVD,  murmurs, rubs, gallops or clicks. No pedal edema. Gastrointestinal system: Abdomen is nondistended, soft and nontender. No organomegaly or masses felt. Normal bowel sounds heard. Central nervous system: Alert and oriented. No focal neurological deficits. Extremities: Symmetric 5 x 5 power. Skin: No rashes, lesions or ulcers Psychiatry: Judgement and insight appear normal. Mood & affect is flat    Data Reviewed:   CBC:  Recent Labs Lab 12/03/16 1316 12/04/16 0622  WBC 9.9 7.1  NEUTROABS 8.5*  --   HGB 14.1 12.8  HCT 40.8 37.1  MCV 94.9 95.4  PLT 241 0000000   Basic Metabolic Panel:  Recent Labs Lab 12/03/16 1316 12/04/16 0622  NA 135 141  K 3.3* 3.9  CL 105 111  CO2 16* 22   GLUCOSE 120* 109*  BUN 16 13  CREATININE 0.75 0.67  CALCIUM 9.0 8.3*  MG  --  2.2   GFR: Estimated Creatinine Clearance: 80.3 mL/min (by C-G formula based on SCr of 0.67 mg/dL). Liver Function Tests:  Recent Labs Lab 12/03/16 1316  AST 23  ALT 22  ALKPHOS 41  BILITOT 0.7  PROT 7.1  ALBUMIN 4.9   No results for input(s): LIPASE, AMYLASE in the last 168 hours.  Recent Labs Lab 12/03/16 1609 12/03/16 1843  AMMONIA 20 17   Coagulation Profile:  Recent Labs Lab 12/03/16 1316  INR 0.99   Cardiac Enzymes: No results for input(s): CKTOTAL, CKMB, CKMBINDEX, TROPONINI in the last 168 hours. BNP (last 3 results) No results for input(s): PROBNP in the last 8760 hours. HbA1C: No results for input(s): HGBA1C in the last 72 hours. CBG: No results for input(s): GLUCAP in the last 168 hours. Lipid Profile: No results for input(s): CHOL, HDL, LDLCALC, TRIG, CHOLHDL, LDLDIRECT in the last 72 hours. Thyroid Function Tests: No results for input(s): TSH, T4TOTAL, FREET4, T3FREE, THYROIDAB in the last 72 hours. Anemia Panel: No results for input(s): VITAMINB12, FOLATE, FERRITIN, TIBC, IRON, RETICCTPCT in the last 72 hours. Sepsis Labs:  Recent Labs Lab 12/03/16 1336  LATICACIDVEN 0.56    Recent Results (from the past 240 hour(s))  Urine culture     Status: None   Collection Time: 12/03/16  6:30 PM  Result Value Ref Range Status   Specimen Description URINE, RANDOM  Final   Special Requests NONE  Final   Culture NO GROWTH Performed at [facility]   Final   Report Status 12/05/2016 FINAL  Final         Radiology Studies: No results found.      Scheduled Meds: . anastrozole  1 mg Oral Daily  . cholestyramine  4 g Oral TID WC  . enoxaparin (LOVENOX) injection  40 mg Subcutaneous Q24H  . pantoprazole  40 mg Oral Daily  . potassium chloride  10 mEq Oral Daily  . sodium chloride flush  3 mL Intravenous Q12H   Continuous Infusions: . 0.9 % NaCl with  KCl 20 mEq / L 75 mL/hr at 12/05/16 1209     LOS: 4 days    Time spent: 45 mins     Shannon Maya Arsenio Loader, MD [facility] Pager [phone]   If 7PM-7AM, please contact night-coverage [email or web address] Password [email or web address] 12/07/2016, 1:22 PM

## 2016-12-07 NOTE — Progress Notes (Signed)
Torsha Lemus   DOB:1961/06/16   PJ#:[id number]   BHA#:[id number]  Subjective: Met with Ms. Gargan in her room and reviewed her recent developments. Basically she presented to the emergency room 12/03/2016 in complete confusion. She was admitted and all her psychiatric medications were discontinued. A psychiatric consult was obtained (see note from 12/06/2016). Every effort is being radiated to try to obtain an brain MRI, which however for this patient can only be done under sedation. She has refused a lumbar puncture at this point.  She denies headache, visual changes, nausea, vomiting, dizziness, or gait imbalance. She denies stiff neck, cough, phlegm production, or pleurisy.   Objective: Middle-aged white woman examined sitting at bedside Vitals:   12/07/16 0635 12/07/16 1456  BP: 111/70 122/73  Pulse: 93 88  Resp: 17 16  Temp: 98.4 F (36.9 C) 97.9 F (36.6 C)    Body mass index is 27.29 kg/m.  Intake/Output Summary (Last 24 hours) at 12/07/16 1826 Last data filed at 12/07/16 1459  Gross per 24 hour  Intake             1625 ml  Output             1450 ml  Net              175 ml     Sclerae unicteric, extraocular movements intact  No cervical or supraclavicular adenopathy  Lungs no rales or wheezes  Heart regular rate and rhythm  Abdomen soft, +BS  Neuro entirely nonfocal, moves easily, speech is articulate, she is alert and oriented 3,  Breast exam: Deferred  CBG (last 3)  No results for input(s): GLUCAP in the last 72 hours.   Labs:  Lab Results  Component Value Date   WBC 7.1 12/04/2016   HGB 12.8 12/04/2016   HCT 37.1 12/04/2016   MCV 95.4 12/04/2016   PLT 204 12/04/2016   NEUTROABS 8.5 (H) 12/03/2016    _0 @  Urine Studies No results for input(s): UHGB, CRYS in the last 72 hours.  Invalid input(s): UACOL, UAPR, USPG, UPH, UTP, UGL, UKET, UBIL, UNIT, UROB, ULEU, UEPI, UWBC, URBC, UBAC, CAST, UCOM, BILUA  Basic Metabolic Panel:  Recent  Labs Lab 12/03/16 1316 12/04/16 0622  NA 135 141  K 3.3* 3.9  CL 105 111  CO2 16* 22  GLUCOSE 120* 109*  BUN 16 13  CREATININE 0.75 0.67  CALCIUM 9.0 8.3*  MG  --  2.2   GFR Estimated Creatinine Clearance: 80.3 mL/min (by C-G formula based on SCr of 0.67 mg/dL). Liver Function Tests:  Recent Labs Lab 12/03/16 1316  AST 23  ALT 22  ALKPHOS 41  BILITOT 0.7  PROT 7.1  ALBUMIN 4.9   No results for input(s): LIPASE, AMYLASE in the last 168 hours.  Recent Labs Lab 12/03/16 1609 12/03/16 1843  AMMONIA 20 17   Coagulation profile  Recent Labs Lab 12/03/16 1316  INR 0.99    CBC:  Recent Labs Lab 12/03/16 1316 12/04/16 0622  WBC 9.9 7.1  NEUTROABS 8.5*  --   HGB 14.1 12.8  HCT 40.8 37.1  MCV 94.9 95.4  PLT 241 204   Cardiac Enzymes: No results for input(s): CKTOTAL, CKMB, CKMBINDEX, TROPONINI in the last 168 hours. BNP: Invalid input(s): POCBNP CBG: No results for input(s): GLUCAP in the last 168 hours. D-Dimer No results for input(s): DDIMER in the last 72 hours. Hgb A1c No results for input(s): HGBA1C in the last 72 hours. Lipid Profile No results  for input(s): CHOL, HDL, LDLCALC, TRIG, CHOLHDL, LDLDIRECT in the last 72 hours. Thyroid function studies No results for input(s): TSH, T4TOTAL, T3FREE, THYROIDAB in the last 72 hours.  Invalid input(s): FREET3 Anemia work up No results for input(s): VITAMINB12, FOLATE, FERRITIN, TIBC, IRON, RETICCTPCT in the last 72 hours. Microbiology Recent Results (from the past 240 hour(s))  Urine culture     Status: None   Collection Time: 12/03/16  6:30 PM  Result Value Ref Range Status   Specimen Description URINE, RANDOM  Final   Special Requests NONE  Final   Culture NO GROWTH Performed at [facility]   Final   Report Status 12/05/2016 FINAL  Final  C difficile quick scan w PCR reflex     Status: None   Collection Time: 12/07/16  5:11 PM  Result Value Ref Range Status   C Diff antigen  NEGATIVE NEGATIVE Final   C Diff toxin NEGATIVE NEGATIVE Final   C Diff interpretation No C. difficile detected.  Final      Studies:  No results found.  Assessment: 56 y.o. 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  (2) status post left mastectomy and axillary lymph node dissection  with immediate expander placement 07/18/2015 for an mpT4 pN2,stage IIIB invasive ductal carcinoma, grade 3, with negative margins.             (a) definitive implant exchange to be scheduled in December            METASTATIC DISEASE: October 2016  (3) CT scan of the chest abdomen and pelvis  09/16/2015 shows metastatic lesions in the right scapula, left iliac crest, L4, and T spine. There were questionable liver cysts, with repeat CT scan 03/02/2016 showing possible right upper lobe lung lesions and possibly increased liver lesions             (a) CT scan of the chest 06/17/2016 shows no active disease in the lungs or liver             (b) Bone scan July 2017 showed no evidence of bony metastatic disease              (c) head CT 07/08/2016 showed a cerebellar lesion, confirmed by MRI 07/23/2016, status post craniotomy 08/14/2016, confirming a metastatic deposit which was estrogen and progesterone receptor negative, HER-2 amplified with a signals ratio of 7.16, number per cell 13.25             (d) CA 27-29 not informative  (4) received docetaxel every 3 weeks 6 together with trastuzumab and pertuzumab, last docetaxel dose 02/11/2016  (5) adjuvant radiation7/03/2016 to 06/26/2016 at [place]: 1. The Left chest wall was treated to 23.4 Gy in 13 fractions at 1.8 Gy per fraction. 2. The Left chest wall was boosted to 10 Gy in 5 fractions at 2 Gy per fraction. 3. The Left Sclav/PAB was treated to 23.4 Gy in 13  fractions at 1.8 Gy per fraction.  Including the patient's treatment in [employer] (received 15 fractions per Dr. Maryan Rued near [place], [place]), the patient received 50.4 Gy to the left chest wall and supraclavicular region.   (6) started trastuzumab and pertuzumab October 2016, continuing every 3 weeks,             (a) echocardiogram 02/26/2016 showed a well preserved  ejection fraction             (b) echocardiogram 07/01/2016 shows an ejection fraction in the 60-65%              (c) pertuzumab discontinued 08/25/2016 with uncontrolled diarrhea  (7) started denosumab/Xgeva October 2017, repeated every 6 weeks  (8) started anastrozole October 2017   (9) history of bipolar disorder             (a) lithium level checked every 6 weeks  (10) mild anemia with a significant drop in the MCV, ferritin 10 06/03/2016,              (a) Feraheme given 06/12/2016 and 06/18/2016  (11) tobacco abuse: Chantix started 06/18/2016  (12) 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             (a) brain irradiation10/19/17-11/08/17:Whole brain/ 35 Gy in 14 fractions    Plan:  The Ananda I saw this afternoon in the hospital is pretty much at her baseline. She was articulate, anxious, depressed, well-oriented, and understood everything that was going on. There was nothing by exam that suggested meningeal spread of her disease.  I think she had something of a "mental breakdown" a few days ago when she presented to the emergency room. She does have a very difficult situation overall. It is possible also that she may have missed some of her medications.  At any rate I greatly appreciate the hospitalist efforts to get this patient a brain MRI with full sedation. We did our best to get this done and we could only get it scheduled for 12/10/2016 at the earliest. If this can be moved to tomorrow at [facility] that would be a great help.  My expectation is that we  will not see meningeal spread of disease. In that case we would continue her treatment, which she is tolerating well, and which appears to be controlling her peripheral disease. She is due for repeat trastuzumab and pertuzumab tomorrow 12/08/2016. We can easily move that to one week back. Her Delton See can also be delayed without concerns. She is ready back on anastrozole and of course should continue on that agent indefinitely..  I can tell from Dr. Marquis Buggy note whether he felt the patient should be back on lithium, lamictal, and  Trazodone which was a psychiatric treatment she was previously on. She has not seen a psychiatrist here or a counselor since leaving [place]. I will place a reconsult to Dr. Ardelia Mems to clarify that question  We'll follow with you.  Chauncey Cruel, MD 12/07/2016  6:26 PM Medical Oncology and Hematology [facility] [address] [place], [place] [address] Tel. [phone]    Fax. [phone]

## 2016-12-08 ENCOUNTER — Ambulatory Visit: Payer: Self-pay | Admitting: Oncology

## 2016-12-08 ENCOUNTER — Telehealth: Payer: Self-pay | Admitting: *Deleted

## 2016-12-08 ENCOUNTER — Ambulatory Visit: Payer: Self-pay

## 2016-12-08 ENCOUNTER — Other Ambulatory Visit: Payer: Self-pay

## 2016-12-08 DIAGNOSIS — F3130 Bipolar disorder, current episode depressed, mild or moderate severity, unspecified: Secondary | ICD-10-CM | POA: Diagnosis present

## 2016-12-08 DIAGNOSIS — Z9889 Other specified postprocedural states: Secondary | ICD-10-CM

## 2016-12-08 MED ORDER — TRAZODONE HCL 50 MG PO TABS
150.0000 mg | ORAL_TABLET | Freq: Every evening | ORAL | Status: DC | PRN
  Administered 2016-12-09: 150 mg via ORAL
  Filled 2016-12-08: qty 1

## 2016-12-08 MED ORDER — ALPRAZOLAM 0.5 MG PO TABS
1.0000 mg | ORAL_TABLET | Freq: Four times a day (QID) | ORAL | Status: DC | PRN
  Administered 2016-12-08 – 2016-12-09 (×3): 1 mg via ORAL
  Filled 2016-12-08 (×4): qty 2

## 2016-12-08 MED ORDER — LAMOTRIGINE 25 MG PO TABS
150.0000 mg | ORAL_TABLET | Freq: Two times a day (BID) | ORAL | Status: DC
  Administered 2016-12-08 – 2016-12-09 (×4): 150 mg via ORAL
  Filled 2016-12-08 (×5): qty 2

## 2016-12-08 MED ORDER — LITHIUM CARBONATE ER 300 MG PO TBCR
300.0000 mg | EXTENDED_RELEASE_TABLET | Freq: Two times a day (BID) | ORAL | Status: DC
  Administered 2016-12-08 – 2016-12-09 (×3): 300 mg via ORAL
  Filled 2016-12-08 (×5): qty 1

## 2016-12-08 MED ORDER — GABAPENTIN 600 MG PO TABS
600.0000 mg | ORAL_TABLET | Freq: Three times a day (TID) | ORAL | Status: DC
  Administered 2016-12-08 – 2016-12-09 (×5): 600 mg via ORAL
  Filled 2016-12-08 (×6): qty 1

## 2016-12-08 NOTE — Progress Notes (Signed)
Pt is anxious and says that ativan did not help relieve it. Pt request xanax. MD made aware and he ordered xanax.

## 2016-12-08 NOTE — Plan of Care (Signed)
Pt is arrived to the unit by EMT.  Pt is confused, oriented to self, impulsive, refuses most of the care.  Stated that she is tired and want to be left alone. Refused bed alarm and telemetry. Enteric precautions initiated, stool panel pending.

## 2016-12-08 NOTE — Consult Note (Signed)
[place] Psychiatry Consult   Reason for Consult:  Medication treatment for bipolar disorder Referring Physician: Dr. Reesa Chew Patient Identification: Mekhia Casal MRN:  [id number] Principal Diagnosis: Bipolar I disorder, most recent episode depressed [facility]) Diagnosis:   Patient Active Problem List   Diagnosis Date Noted  . Adjustment disorder with anxiety [F43.22] 12/06/2016  . Delirium due to another medical condition [F05] 12/03/2016  . Acute encephalopathy [G93.40] 12/03/2016  . Diarrhea [R19.7] 12/03/2016  . Metastatic breast cancer ([place]) [C50.919]   . Cerumen impaction [H61.20] 11/25/2016  . Otitis media [H66.90] 11/06/2016  . Brain metastasis ([place]) [C79.31] 07/27/2016  . Iron deficiency anemia [D50.9] 06/26/2016  . Bone metastases ([place]) [C79.51] 06/03/2016  . Primary cancer of lower-inner quadrant of left female breast ([place]) [C50.312] 06/01/2016  . Pap smear for cervical cancer screening [Z12.4] 03/28/2015  . Current smoker [F17.200] 03/28/2015  . Healthcare maintenance [Z00.00] 03/28/2015  . Seasonal allergies [J30.2] 03/28/2015  . Anxiety state [F41.1] 02/28/2015  . Fibromyalgia [M79.7] 02/28/2015  . Family history of diabetes mellitus [Z83.3] 02/28/2015  . H/O alcohol abuse [Z87.898]     Total Time spent with patient: 45 minutes  Subjective:   [name] is a 56 y.o. female patient admitted with confusion.  HPI:  [name] is a 56 years old female with a history of bipolar disorder and multiple medical problems including breast cancer and metastasis to the bones and brain. Patient is seen, chart reviewed and case discussed with the staff RN for the psychiatric medication management. Patient reportedly suffering with bipolar depression that has been compliant with her outpatient psychiatric medication management from family service of [place]. Reportedly she was recently prescribed by her medication by her oncologist. Patient endorses symptoms of depression and  anxiety. She also reported long history of polysubstance abuse and alcoholism. Patient reportedly staying in a transitional living placement for rehabilitation for several years. Patient denies current symptoms of suicidal/homicidal ideation, intention or plans. Patient has no evidence of auditory/visual hallucinations or paranoid delusions.   Past Psychiatric History: Bipolar disorder and also polysubstance abuse in remission.  Risk to Self: Is patient at risk for suicide?: No (unable to assess/obtain) Risk to Others:   Prior Inpatient Therapy:   Prior Outpatient Therapy:    Past Medical History:  Past Medical History:  Diagnosis Date  . Alcohol abuse   . Anemia    during chemo  . Anxiety    At age 32  . Arthritis Dx 2010  . Bipolar disorder ([place])   . Cancer ([place])    breast mets to brain  . Chronic pain   . Complication of anesthesia   . Depression   . Fibromyalgia Dx 2005  . GERD (gastroesophageal reflux disease)   . Headache    hx  migraines  . Opiate dependence ([place])   . PONV (postoperative nausea and vomiting)   . Port-a-cath in place   . PTSD (post-traumatic stress disorder)     Past Surgical History:  Procedure Laterality Date  . APPLICATION OF CRANIAL NAVIGATION N/A 08/14/2016   Procedure: APPLICATION OF CRANIAL NAVIGATION;  Surgeon: Erline Levine, MD;  Location: [place] [facility];  Service: Neurosurgery;  Laterality: N/A;  . BREAST RECONSTRUCTION Left    with silicone implant  . CRANIOTOMY N/A 08/14/2016   Procedure: CRANIOTOMY TUMOR EXCISION WITH Lucky Rathke;  Surgeon: Erline Levine, MD;  Location: [facility];  Service: Neurosurgery;  Laterality: N/A;  . FIBULA FRACTURE SURGERY Left   . MASTECTOMY Left   . RADIOLOGY WITH  ANESTHESIA N/A 07/23/2016   Procedure: MRI OF BRAIN WITH AND WITHOUT;  Surgeon: Medication Radiologist, MD;  Location: [facility];  Service: Radiology;  Laterality: N/A;  . RADIOLOGY WITH ANESTHESIA N/A 09/08/2016   Procedure: MRI OF BRAIN WITH AND WITHOUT  CONTRAST;  Surgeon: Medication Radiologist, MD;  Location[facility];  Service: Radiology;  Laterality: N/A;  . right power port placement Right    Family History:  Family History  Problem Relation Age of Onset  . Diabetes Mother   . Bipolar disorder Mother   . CAD Father    Family Psychiatric  History:  Social History:  History  Alcohol Use  . Yes    Comment: no ETOH since 08/22/12     History  Drug Use No    Comment: prescription opiates from the street daily until Sunday    Social History   Social History  . Marital status: Single    Spouse name: N/A  . Number of children: N/A  . Years of education: N/A   Social History Main Topics  . Smoking status: Current Every Day Smoker    Packs/day: 0.50    Types: Cigarettes  . Smokeless tobacco: Never Used     Comment: Pt is on Chantix at present time  . Alcohol use Yes     Comment: no ETOH since 08/22/12  . Drug use: No     Comment: prescription opiates from the street daily until Sunday  . Sexual activity: No     Comment: ablation   Other Topics Concern  . None   Social History Narrative  . None   Additional Social History:    Allergies:   Allergies  Allergen Reactions  . Demerol Itching and Nausea And Vomiting  . Erythromycin Rash    Can take zpak    Labs:  Results for orders placed or performed during the hospital encounter of 12/03/16 (from the past 48 hour(s))  C difficile quick scan w PCR reflex     Status: None   Collection Time: 12/07/16  5:11 PM  Result Value Ref Range   C Diff antigen NEGATIVE NEGATIVE   C Diff toxin NEGATIVE NEGATIVE   C Diff interpretation No C. difficile detected.     Current Facility-Administered Medications  Medication Dose Route Frequency Provider Last Rate Last Dose  . 0.9 % NaCl with KCl 20 mEq/ L  infusion   Intravenous Continuous Orson Eva, MD 75 mL/hr at 12/05/16 1209    . acetaminophen (TYLENOL) tablet 650 mg  650 mg Oral Q6H PRN Orson Eva, MD   650 mg at 12/08/16 [id number]    Or  . acetaminophen (TYLENOL) suppository 650 mg  650 mg Rectal Q6H PRN Orson Eva, MD      . anastrozole (ARIMIDEX) tablet 1 mg  1 mg Oral Daily Orson Eva, MD   1 mg at 12/08/16 0959  . cholestyramine (QUESTRAN) packet 4 g  4 g Oral TID WC Orson Eva, MD   4 g at 12/08/16 1204  . enoxaparin (LOVENOX) injection 40 mg  40 mg Subcutaneous Q24H Orson Eva, MD   40 mg at 12/05/16 2210  . LORazepam (ATIVAN) tablet 1 mg  1 mg Oral Q6H PRN Ankit Arsenio Loader, MD   1 mg at 12/07/16 1901  . ondansetron (ZOFRAN) tablet 4 mg  4 mg Oral Q6H PRN Orson Eva, MD       Or  . ondansetron (ZOFRAN) injection 4 mg  4 mg Intravenous Q6H PRN Orson Eva,  MD   4 mg at 12/04/16 0501  . pantoprazole (PROTONIX) EC tablet 40 mg  40 mg Oral Daily Orson Eva, MD   40 mg at 12/08/16 0959  . potassium chloride (K-DUR,KLOR-CON) CR tablet 10 mEq  10 mEq Oral Daily Orson Eva, MD   10 mEq at 12/08/16 1000  . sodium chloride flush (NS) 0.9 % injection 10-40 mL  10-40 mL Intracatheter PRN Ankit Chirag Amin, MD      . sodium chloride flush (NS) 0.9 % injection 3 mL  3 mL Intravenous Q12H Orson Eva, MD   3 mL at 12/06/16 2200    Musculoskeletal: Strength & Muscle Tone: not tested Gait & Station: not tested Patient leans: N/A  Psychiatric Specialty Exam: Physical Exam  Psychiatric: Her speech is normal and behavior is normal. Judgment and thought content normal. Her mood appears anxious. Cognition and memory are normal.    Review of Systems  Constitutional: Positive for malaise/fatigue.  HENT: Negative.   Eyes: Negative.   Respiratory: Negative.   Cardiovascular: Negative.   Gastrointestinal: Positive for nausea.  Genitourinary: Negative.   Skin: Negative.   Neurological: Negative.   Psychiatric/Behavioral: The patient is nervous/anxious.     Blood pressure 109/61, pulse 92, temperature 98.6 F (37 C), temperature source Oral, resp. rate 18, height 5\' 5"  (1.651 m), weight 67.8 kg (149 lb 7.6 oz), SpO2 100 %.Body mass index  is 24.87 kg/m.  General Appearance: Casual  Eye Contact:  Good  Speech:  Clear and Coherent  Volume:  Normal  Mood:  Anxious  Affect:  Appropriate  Thought Process:  Coherent  Orientation:  Full (Time, Place, and Person)  Thought Content:  Logical  Suicidal Thoughts:  No  Homicidal Thoughts:  No  Memory:  Immediate;   Fair Recent;   Fair Remote;   Fair  Judgement:  Fair  Insight:  Present  Psychomotor Activity:  Normal  Concentration:  Concentration: Fair and Attention Span: Fair  Recall:  [employer] of Knowledge:  Fair  Language:  Good  Akathisia:  No  Handed:  Right  AIMS (if indicated):     Assets:  Communication Skills Desire for Improvement  ADL's:  Intact  Cognition:  WNL  Sleep:   fair     Treatment Plan Summary: Assessment: 56 year old woman with history of Bipolar disorder who denies current symptoms of Bipolar Mania and endorses depression anxiety. Patient is alert and oriented and verbalizes understanding of the need to cooperate and getting treatment necessary for her medical issues. Patient is willing to provide consent restart her home medication during this visit, reportedly they are helpful and does not have any adverse effects.  Plan/Recomendation: Reviewed previous psychiatric evaluation for capacity and evaluation to documentation indicated patient has capacity to make her own medical decisions as per Dr. Darleene Cleaver.  Restart lithium bicarbonate 300 mg 2 times daily for mood swings, lamotrigine 150 mg twice daily for mood swings, trazodone 150 mg at bedtime as needed for insomnia, gabapentin 600 mg 3 times daily for chronic pains  Monitor for lithium levels in 5 days for therapeutic range and also to avoid toxicity monitor for the skin rashes associated with the lamotrigine during this hospitalization   Appreciate psychiatric consultation and follow up as clinically required Please contact [phone] or [phone] if needs further  assistance  Disposition: No evidence of imminent risk to self or others at present.   Patient does not meet criteria for psychiatric inpatient admission. Supportive therapy  provided about ongoing stressors.  Ambrose Finland, MD 12/08/2016 1:13 PM

## 2016-12-08 NOTE — Progress Notes (Signed)
Pt educated and still refusing bed alarm.

## 2016-12-08 NOTE — Telephone Encounter (Signed)
Called and left voice message for Dr. Virgie Dad nurse, patient is at [facility], as an inpatient  8:25 AM

## 2016-12-08 NOTE — Care Management Note (Addendum)
Case Management Note  Patient Details  Name: [name] MRN: [id number] Date of Birth: 1961-11-30  Subjective/Objective:                 Patient from home, alone. Patient admitted for AMS thought to be secondary to medication vs malignancy given hx of brain mets.  Patient needs MRI and LP under general sedation, transferred to [facility]W from [facility]. Psych note from 12-06-16 states patient has capacity to make her own decisions and does not meet criteria for inpatient psych hospitalization.    Action/Plan:  Awaiting scheduled MRI w/ sedation results and diagnosis.  Addendum: Spoke with patient at bedside, she confirms she lives in transitional housing off of [address]. She stated that she is independnen for ADLs, does not use DME and declines offer for [facility] or other resources. She was on the phone with MD office (2nd time today) requesting to be discharged from the hospital. CM text paged Dr Tana Coast, to pass along pt request.   Expected Discharge Date:   (unknown)               Expected Discharge Plan:  Home/Self Care  In-House Referral:     Discharge planning Services  CM Consult  Post Acute Care Choice:    Choice offered to:     DME Arranged:    DME Agency:     HH Arranged:    [facility] Agency:     Status of Service:  In process, will continue to follow  If discussed at Long Length of Stay Meetings, dates discussed:    Additional Comments:  Carles Collet, RN 12/08/2016, 3:25 PM

## 2016-12-08 NOTE — Progress Notes (Signed)
PROGRESS NOTE    [name]  [id number] DOB: May 15, 1961 DOA: 12/03/2016 PCP: Chauncey Cruel, MD   Brief Narrative:  Patient admitted for AMS thought to be secondary to medication induced vs malignancy given hx if mets. Basic lab work up has been negative so far. Patient needs MRI and LP under general sedation.   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  She is on the schedule for it on Jan 11th 8am.  - Refusing LP at this time  -Lithium levels low.  -Ativan 1 mg IV changed to po for anxiety  -Psych input appreciated in term of capacity of decision making. Psych notified again to help with restarting psych meds   Diarrhea- improved   Metastatic breast cancer - has had chemo/radiation in the past -continue arimidex -will likely benefit from Palliative care to determine long term goals of care.   Bipolar disorder -Holding all her psychotropic medications at this time secondary to acute encephalopathy -psych consulted, awaiting input.    DVT prophylaxis: Lovenox sq Code Status: Full Family Communication:  Patient is very clear today and is able to understand her condition. She will notify her family.    Disposition Plan: Schedule for MRI brain under general sedation on the 1/11  Consult   Onc, Dr Gudena/Dr Magrinat   Psych   Procedures:   None  Antimicrobials:   None    Subjective: At this time patient denies any complaints. Her  thought are very clear this morning and doesn't have any complaints at all.   Objective: Vitals:   12/07/16 0635 12/07/16 1456 12/07/16 2008 12/08/16 0519  BP: 111/70 122/73 124/73 109/61  Pulse: 93 88 93 92  Resp: 17 16 18 18   Temp: 98.4 F (36.9 C) 97.9 F (36.6 C) 97.8 F (36.6 C) 98.6 F (37 C)  TempSrc: Oral Axillary Oral Oral  SpO2: 97% 100% 100% 100%  Weight:   67.8 kg (149 lb 7.6 oz)   Height:   5\' 5"  (1.651 m)     Intake/Output Summary (Last 24 hours) at 12/08/16 1204 Last data filed at 12/07/16 1459  Gross per 24 hour  Intake              240 ml  Output                0 ml  Net              240 ml   Filed Weights   12/03/16 1210 12/07/16 2008  Weight: 74.4 kg (164 lb) 67.8 kg (149 lb 7.6 oz)    Examination:  General exam: Appears calm and comfortable AAOx4. Very clear thought and coherant. Back to her baseline.  Respiratory system: Clear to auscultation. Respiratory effort normal. Cardiovascular system: S1 & S2 heard, RRR. No JVD, murmurs, rubs, gallops or clicks. No pedal edema. Gastrointestinal system: Abdomen is nondistended, soft and nontender. No organomegaly or masses felt. Normal bowel sounds heard. Central nervous system: Alert  and oriented. No focal neurological deficits. Extremities: Symmetric 5 x 5 power. Skin: No rashes, lesions or ulcers Psychiatry: Judgement and insight appear normal. Mood & affect is appropriate.    Data Reviewed:   CBC:  Recent Labs Lab 12/03/16 1316 12/04/16 0622  WBC 9.9 7.1  NEUTROABS 8.5*  --   HGB 14.1 12.8  HCT 40.8 37.1  MCV 94.9 95.4  PLT 241 0000000   Basic Metabolic Panel:  Recent Labs Lab 12/03/16 1316 12/04/16 0622  NA 135 141  K 3.3* 3.9  CL 105 111  CO2 16* 22  GLUCOSE 120* 109*  BUN 16 13  CREATININE 0.75 0.67  CALCIUM 9.0 8.3*  MG  --  2.2   GFR: Estimated Creatinine Clearance: 71.5 mL/min (by C-G formula based on SCr of 0.67 mg/dL). Liver Function Tests:  Recent Labs Lab  12/03/16 1316  AST 23  ALT 22  ALKPHOS 41  BILITOT 0.7  PROT 7.1  ALBUMIN 4.9   No results for input(s): LIPASE, AMYLASE in the last 168 hours.  Recent Labs Lab 12/03/16 1609 12/03/16 1843  AMMONIA 20 17   Coagulation Profile:  Recent Labs Lab 12/03/16 1316  INR 0.99   Cardiac Enzymes: No results for input(s): CKTOTAL, CKMB, CKMBINDEX, TROPONINI in the last 168 hours. BNP (last 3 results) No results for input(s): PROBNP in the last 8760 hours. HbA1C: No results for input(s): HGBA1C in the last 72 hours. CBG: No results for input(s): GLUCAP in the last 168 hours. Lipid Profile: No results for input(s): CHOL, HDL, LDLCALC, TRIG, CHOLHDL, LDLDIRECT in the last 72 hours. Thyroid Function Tests: No results for input(s): TSH, T4TOTAL, FREET4, T3FREE, THYROIDAB in the last 72 hours. Anemia Panel: No results for input(s): VITAMINB12, FOLATE, FERRITIN, TIBC, IRON, RETICCTPCT in the last 72 hours. Sepsis Labs:  Recent Labs Lab 12/03/16 1336  LATICACIDVEN 0.56    Recent Results (from the past 240 hour(s))  Urine culture     Status: None   Collection Time: 12/03/16  6:30 PM  Result Value Ref Range Status   Specimen Description URINE, RANDOM  Final   Special Requests NONE  Final   Culture NO GROWTH Performed at [facility]   Final   Report Status 12/05/2016 FINAL  Final  C difficile quick scan w PCR reflex     Status: None   Collection Time: 12/07/16  5:11 PM  Result Value Ref Range Status   C Diff antigen NEGATIVE NEGATIVE Final   C Diff toxin NEGATIVE NEGATIVE Final   C Diff interpretation No C. difficile detected.  Final         Radiology Studies: No results found.      Scheduled Meds: . anastrozole  1 mg Oral Daily  . cholestyramine  4 g Oral TID WC  . enoxaparin (LOVENOX) injection  40 mg Subcutaneous Q24H  . pantoprazole  40 mg Oral Daily  . potassium chloride  10 mEq Oral Daily  . sodium chloride flush  3 mL Intravenous Q12H    Continuous Infusions: . 0.9 % NaCl with KCl 20 mEq / L 75 mL/hr at 12/05/16 1209     LOS: 5 days    Time spent: 45 mins     Shaw Dobek Arsenio Loader, MD [facility] Pager [phone]   If 7PM-7AM, please contact night-coverage [email or web address] Password [email or web address] 12/08/2016, 12:04 PM

## 2016-12-09 DIAGNOSIS — F3130 Bipolar disorder, current episode depressed, mild or moderate severity, unspecified: Secondary | ICD-10-CM

## 2016-12-09 DIAGNOSIS — C773 Secondary and unspecified malignant neoplasm of axilla and upper limb lymph nodes: Secondary | ICD-10-CM

## 2016-12-09 MED ORDER — SODIUM CHLORIDE 0.9% FLUSH: 10.0000 mL | INTRAVENOUS | Status: DC | PRN

## 2016-12-09 MED ORDER — LOPERAMIDE HCL 2 MG PO CAPS
2.0000 mg | ORAL_CAPSULE | ORAL | Status: DC | PRN
  Administered 2016-12-09: 2 mg via ORAL
  Filled 2016-12-09: qty 1

## 2016-12-09 NOTE — Progress Notes (Signed)
Report given to Ellard Artis, Therapist, sports.  Patient had uneventful night. Pleasant, just wanted to sleep and not be bothered.

## 2016-12-09 NOTE — Progress Notes (Signed)
PROGRESS NOTE    [name]  [id number] DOB: 1961-07-13 DOA: 12/03/2016 PCP: Chauncey Cruel, MD   Brief Narrative:  Patient admitted for AMS thought to be secondary to medication induced vs malignancy given hx if mets. Basic lab work up has been negative so far. Patient needs MRI and LP under general sedation.   Assessment & Plan:   Principal Problem:   Bipolar I disorder, most recent episode depressed ([place]) Active Problems:   Fibromyalgia   Primary cancer of lower-inner quadrant of left female breast ([place])   Brain metastasis ([place])   Delirium due to another medical condition   Acute encephalopathy   Diarrhea   Metastatic breast cancer ([place])   Adjustment disorder with anxiety  Acute encephalopathy- resolved -serum B12, TSH, RPR, HIV, ammonia, UA and urine culture - all have been WNL at this time -she is positive for benzo and opiates on her UDS -Onc recommends MRI prior to her discharge.  She is on the schedule for it on Jan 11th 8am.  -Lithium levels low.  -Ativan 1 mg IV changed to po for anxiety  -Psych input appreciated in term of capacity of decision making and medication help.   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   Metastatic breast cancer - has had chemo/radiation in the past -continue arimidex -will likely benefit from Palliative care to determine long term goals of care.   Bipolar disorder -Psych medication restarted. Will need to check lithium levels in 5 days.    DVT prophylaxis: Lovenox sq Code Status: Full Family Communication:  Patient mental status is back to her baseline.  Disposition Plan: Schedule for MRI brain under general sedation on the 1/11 in the morning   Consult   Onc, Dr Gudena/Dr Magrinat   Psych   Procedures:   None  Antimicrobials:   None    Subjective: At this time patient denies any complaints. States her mental state is back to  her normal self. She cannot recall any events that happened over the course of last few days and "cannot believe how I acted"    Objective: Vitals:   12/08/16 1413 12/08/16 2307 12/09/16 0527 12/09/16 1000  BP: 130/87 (!) 102/56 (!) 101/51 93/60  Pulse: 98 88 87 90  Resp: 20 20 18 18   Temp: 97.4 F (36.3 C) 98.6 F (37 C) 98.4 F (36.9 C) 97.8 F (36.6 C)  TempSrc: Oral Axillary Axillary Oral  SpO2: 100% 100% 100%   Weight:      Height:       No intake or output data in the 24 hours ending 12/09/16 1328 Filed Weights   12/03/16 1210 12/07/16 2008  Weight: 74.4 kg (164 lb) 67.8 kg (149 lb 7.6 oz)    Examination:  General exam: Appears calm and comfortable AAOx4. Very clear thought and coherant. Back to her baseline.  Respiratory system: Clear to auscultation. Respiratory effort normal. Cardiovascular system: S1 & S2 heard, RRR. No JVD, murmurs, rubs, gallops or clicks. No pedal edema. Gastrointestinal system: Abdomen is nondistended, soft and nontender. No organomegaly or masses felt. Normal bowel sounds heard. Central nervous system: Alert and oriented. No focal neurological deficits. Extremities: Symmetric 5 x 5 power. Skin: No rashes, lesions or ulcers Psychiatry: Judgement and insight appear normal. Mood & affect is appropriate.    Data Reviewed:   CBC:  Recent Labs Lab 12/03/16 1316 12/04/16 0622  WBC 9.9  7.1  NEUTROABS 8.5*  --   HGB 14.1 12.8  HCT 40.8 37.1  MCV 94.9 95.4  PLT 241 0000000   Basic Metabolic Panel:  Recent Labs Lab 12/03/16 1316 12/04/16 0622  NA 135 141  K 3.3* 3.9  CL 105 111  CO2 16* 22  GLUCOSE 120* 109*  BUN 16 13  CREATININE 0.75 0.67  CALCIUM 9.0 8.3*  MG  --  2.2   GFR: Estimated Creatinine Clearance: 71.5 mL/min (by C-G formula based on SCr of 0.67 mg/dL). Liver Function Tests:  Recent Labs Lab 12/03/16 1316  AST 23  ALT 22  ALKPHOS 41  BILITOT 0.7  PROT 7.1  ALBUMIN 4.9   No results for input(s): LIPASE,  AMYLASE in the last 168 hours.  Recent Labs Lab 12/03/16 1609 12/03/16 1843  AMMONIA 20 17   Coagulation Profile:  Recent Labs Lab 12/03/16 1316  INR 0.99   Cardiac Enzymes: No results for input(s): CKTOTAL, CKMB, CKMBINDEX, TROPONINI in the last 168 hours. BNP (last 3 results) No results for input(s): PROBNP in the last 8760 hours. HbA1C: No results for input(s): HGBA1C in the last 72 hours. CBG: No results for input(s): GLUCAP in the last 168 hours. Lipid Profile: No results for input(s): CHOL, HDL, LDLCALC, TRIG, CHOLHDL, LDLDIRECT in the last 72 hours. Thyroid Function Tests: No results for input(s): TSH, T4TOTAL, FREET4, T3FREE, THYROIDAB in the last 72 hours. Anemia Panel: No results for input(s): VITAMINB12, FOLATE, FERRITIN, TIBC, IRON, RETICCTPCT in the last 72 hours. Sepsis Labs:  Recent Labs Lab 12/03/16 1336  LATICACIDVEN 0.56    Recent Results (from the past 240 hour(s))  Urine culture     Status: None   Collection Time: 12/03/16  6:30 PM  Result Value Ref Range Status   Specimen Description URINE, RANDOM  Final   Special Requests NONE  Final   Culture NO GROWTH Performed at [facility]   Final   Report Status 12/05/2016 FINAL  Final  C difficile quick scan w PCR reflex     Status: None   Collection Time: 12/07/16  5:11 PM  Result Value Ref Range Status   C Diff antigen NEGATIVE NEGATIVE Final   C Diff toxin NEGATIVE NEGATIVE Final   C Diff interpretation No C. difficile detected.  Final         Radiology Studies: No results found.      Scheduled Meds: . anastrozole  1 mg Oral Daily  . cholestyramine  4 g Oral TID WC  . enoxaparin (LOVENOX) injection  40 mg Subcutaneous Q24H  . gabapentin  600 mg Oral TID  . lamoTRIgine  150 mg Oral BID  . lithium carbonate  300 mg Oral Q12H  . pantoprazole  40 mg Oral Daily  . potassium chloride  10 mEq Oral Daily  . sodium chloride flush  3 mL Intravenous Q12H   Continuous  Infusions: . 0.9 % NaCl with KCl 20 mEq / L 75 mL/hr at 12/05/16 1209     LOS: 6 days    Time spent: 25 mins     Aime Meloche Arsenio Loader, MD [facility] Pager [phone]   If 7PM-7AM, please contact night-coverage [email or web address] Password [email or web address] 12/09/2016, 1:28 PM

## 2016-12-09 NOTE — Progress Notes (Signed)
[name]   DOB:12-05-1960   WU#:[id number]   GNF#:[id number]  Subjective: .Discussed overall situation with patient; she admits she "lost it" last week-- fear about brain mets and the cancer in general, no family in town, "lots of pressure." Her counselor at family services is no longer there and she hs not reestablished herself there. She does not have a psychiatrist in town. She was told by the person who owns her half-way house she may not be able to get back there unless she's stable (eating, functioning normally as she was before). Calmer today, shows more insight, is back on her meds (restarted yesterday); ROS otherwise stable   Objective: Middle-aged white woman examined at bedside Vitals:   12/08/16 2307 12/09/16 0527  BP: (!) 102/56 (!) 101/51  Pulse: 88 87  Resp: 20 18  Temp: 98.6 F (37 C) 98.4 F (36.9 C)    Body mass index is 24.87 kg/m.  Intake/Output Summary (Last 24 hours) at 12/09/16 0739 Last data filed at 12/08/16 1300  Gross per 24 hour  Intake                0 ml  Output                0 ml  Net                0 ml     Sensorium clear, A&O x3, nonfocal/ normal neuro exam, appropriate mood CBG (last 3)  No results for input(s): GLUCAP in the last 72 hours.   Labs:  Lab Results  Component Value Date   WBC 7.1 12/04/2016   HGB 12.8 12/04/2016   HCT 37.1 12/04/2016   MCV 95.4 12/04/2016   PLT 204 12/04/2016   NEUTROABS 8.5 (H) 12/03/2016    _0 @  Urine Studies No results for input(s): UHGB, CRYS in the last 72 hours.  Invalid input(s): UACOL, UAPR, USPG, UPH, UTP, UGL, UKET, UBIL, UNIT, UROB, ULEU, UEPI, UWBC, URBC, UBAC, CAST, UCOM, BILUA  Basic Metabolic Panel:  Recent Labs Lab 12/03/16 1316 12/04/16 0622  NA 135 141  K 3.3* 3.9  CL 105 111  CO2 16* 22  GLUCOSE 120* 109*  BUN 16 13  CREATININE 0.75 0.67  CALCIUM 9.0 8.3*  MG  --  2.2   GFR Estimated Creatinine Clearance: 71.5 mL/min (by C-G formula based on SCr of 0.67  mg/dL). Liver Function Tests:  Recent Labs Lab 12/03/16 1316  AST 23  ALT 22  ALKPHOS 41  BILITOT 0.7  PROT 7.1  ALBUMIN 4.9   No results for input(s): LIPASE, AMYLASE in the last 168 hours.  Recent Labs Lab 12/03/16 1609 12/03/16 1843  AMMONIA 20 17   Coagulation profile  Recent Labs Lab 12/03/16 1316  INR 0.99    CBC:  Recent Labs Lab 12/03/16 1316 12/04/16 0622  WBC 9.9 7.1  NEUTROABS 8.5*  --   HGB 14.1 12.8  HCT 40.8 37.1  MCV 94.9 95.4  PLT 241 204   Cardiac Enzymes: No results for input(s): CKTOTAL, CKMB, CKMBINDEX, TROPONINI in the last 168 hours. BNP: Invalid input(s): POCBNP CBG: No results for input(s): GLUCAP in the last 168 hours. D-Dimer No results for input(s): DDIMER in the last 72 hours. Hgb A1c No results for input(s): HGBA1C in the last 72 hours. Lipid Profile No results for input(s): CHOL, HDL, LDLCALC, TRIG, CHOLHDL, LDLDIRECT in the last 72 hours. Thyroid function studies No results for input(s): TSH, T4TOTAL, T3FREE, THYROIDAB in the  last 72 hours.  Invalid input(s): FREET3 Anemia work up No results for input(s): VITAMINB12, FOLATE, FERRITIN, TIBC, IRON, RETICCTPCT in the last 72 hours. Microbiology Recent Results (from the past 240 hour(s))  Urine culture     Status: None   Collection Time: 12/03/16  6:30 PM  Result Value Ref Range Status   Specimen Description URINE, RANDOM  Final   Special Requests NONE  Final   Culture NO GROWTH Performed at [facility]   Final   Report Status 12/05/2016 FINAL  Final  C difficile quick scan w PCR reflex     Status: None   Collection Time: 12/07/16  5:11 PM  Result Value Ref Range Status   C Diff antigen NEGATIVE NEGATIVE Final   C Diff toxin NEGATIVE NEGATIVE Final   C Diff interpretation No C. difficile detected.  Final      Studies:  No results found.  Assessment: 56 y.o. 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  (2) status post left mastectomy and axillary lymph node dissection  with immediate expander placement 07/18/2015 for an mpT4 pN2,stage IIIB invasive ductal carcinoma, grade 3, with negative margins.             (a) definitive implant exchange to be scheduled in December            METASTATIC DISEASE: October 2016  (3) CT scan of the chest abdomen and pelvis  09/16/2015 shows metastatic lesions in the right scapula, left iliac crest, L4, and T spine. There were questionable liver cysts, with repeat CT scan 03/02/2016 showing possible right upper lobe lung lesions and possibly increased liver lesions             (a) CT scan of the chest 06/17/2016 shows no active disease in the lungs or liver             (b) Bone scan July 2017 showed no evidence of bony metastatic disease              (c) head CT 07/08/2016 showed a cerebellar lesion, confirmed by MRI 07/23/2016, status post craniotomy 08/14/2016, confirming a metastatic deposit which was estrogen and progesterone receptor negative, HER-2 amplified with a signals ratio of 7.16, number per cell 13.25             (d) CA 27-29 not informative  (4) received docetaxel every 3 weeks 6 together with trastuzumab and pertuzumab, last docetaxel dose 02/11/2016  (5) adjuvant radiation7/03/2016 to 06/26/2016 at [place]: 1. The Left chest wall was treated to 23.4 Gy in 13 fractions at 1.8 Gy per fraction. 2. The Left chest wall was boosted to 10 Gy in 5 fractions at 2 Gy per fraction. 3. The Left Sclav/PAB was treated to 23.4 Gy in 13 fractions at 1.8 Gy per fraction.  Including the patient's treatment in [place] (received 15 fractions per Dr. Maryan Rued near [place], [place]), the patient received 50.4 Gy to the left chest wall and supraclavicular region.   (6) started trastuzumab and pertuzumab October  2016, continuing every 3 weeks,             (a) echocardiogram 02/26/2016 showed a well preserved ejection fraction             (b) echocardiogram 07/01/2016 shows an ejection fraction in the 60-65%              (  c) pertuzumab discontinued 08/25/2016 with uncontrolled diarrhea  (7) started denosumab/Xgeva October 2017, repeated every 6 weeks  (8) started anastrozole October 2017   (9) history of bipolar disorder             (a) lithium level checked every 6 weeks  (10) mild anemia with a significant drop in the MCV, ferritin 10 06/03/2016,              (a) Feraheme given 06/12/2016 and 06/18/2016  (11) tobacco abuse: Chantix started 06/18/2016  (12) 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             (a) brain irradiation10/19/17-11/08/17:Whole brain/ 35 Gy in 14 fractions    Plan:  Her brain MRI could not be moved up after all and she is still scheduled for tomorrow. I feel confident it will be negative, but even if it shows evidence of leptomeningeal spread that would not be a reason to delay discharge as we generally treat that on an outpatient basis.  It would be helpful if SW or the discharge coordinator could make sure the patient has an appt at [facility] where she gets ger counseling and her psychiatric meds including lithium could be regulated. Of course if the patient cannot return to her group home she would need placement elsewhere  She was due for herceptin yesterday. I will reschedule her for a treatment 1/16 in our office-- we will call her with the specific time. She continues on anastrozole. Her 3 bipolar meds should also be continued at d/c  Greatly appreciate your help to this patient. Please let me know if I can be of further help  Chauncey Cruel, MD 12/09/2016  7:39 AM Medical Oncology and Hematology [facility] [address] [place], [place] [address] Tel. [phone]    Fax.  [phone]

## 2016-12-09 NOTE — Anesthesia Preprocedure Evaluation (Addendum)
Anesthesia Evaluation  Patient identified by MRN, date of birth, ID band Patient awake    Reviewed: Allergy & Precautions, NPO status , Patient's Chart, lab work & pertinent test results  History of Anesthesia Complications (+) PONV  Airway Mallampati: II  TM Distance: >3 FB Neck ROM: Full    Dental  (+) Teeth Intact, Dental Advisory Given   Pulmonary Current Smoker,    breath sounds clear to auscultation       Cardiovascular negative cardio ROS   Rhythm:Regular Rate:Normal     Neuro/Psych  Headaches, Anxiety Depression Bipolar Disorder Metastatic breast ca to brain with admission for AMS    GI/Hepatic Neg liver ROS, GERD  ,  Endo/Other  negative endocrine ROS  Renal/GU negative Renal ROS     Musculoskeletal  (+) Arthritis , Fibromyalgia -  Abdominal   Peds  Hematology negative hematology ROS (+)   Anesthesia Other Findings   Reproductive/Obstetrics                           Lab Results  Component Value Date   WBC 7.1 12/04/2016   HGB 12.8 12/04/2016   HCT 37.1 12/04/2016   MCV 95.4 12/04/2016   PLT 204 12/04/2016   Lab Results  Component Value Date   CREATININE 0.67 12/04/2016   BUN 13 12/04/2016   NA 141 12/04/2016   K 3.9 12/04/2016   CL 111 12/04/2016   CO2 22 12/04/2016    Anesthesia Physical  Anesthesia Plan  ASA: II  Anesthesia Plan: General   Post-op Pain Management:    Induction: Intravenous  Airway Management Planned: LMA  Additional Equipment:   Intra-op Plan:   Post-operative Plan: Extubation in OR  Informed Consent: I have reviewed the patients History and Physical, chart, labs and discussed the procedure including the risks, benefits and alternatives for the proposed anesthesia with the patient or authorized representative who has indicated his/her understanding and acceptance.   Dental advisory given  Plan Discussed with: CRNA and  Anesthesiologist  Anesthesia Plan Comments:        Anesthesia Quick Evaluation

## 2016-12-09 NOTE — Progress Notes (Signed)
CSW consulted to ensure pt can return to group home setting and to help pt establish follow up with outpatient counseling resources that can help manage her psych medications.  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  Group home manager, Cletus, wants to make sure pt is independent to return to facility (able to eat and care for herself).  At this time pt feels physically able to do everything and eating is just difficult due to appetite.  CSW able to call Cletus when pt ready for DC if there are any issues with him accepting back.  Pt did state she doesn't like living at group home but not interested in pursing placement like SNF or ALF at this time- states she will figure out next steps once she returns to group home.  CSW also spoke with patient about follow up with [facility] the [place].  Pt was seeing Metta Clines at [facility] but had to stop when she lost her insurance because that provider does not take Medicaid.  Was set up with appointment for the provider at [facility] that accepts Medicaid but no appts until April 2018.  CSW called [facility] with patient permission to try and obtain a [place] appt for her- awaiting return call.  CSW also informed pt she can walk into [facility] with her AVS to get same day assessment if she needs to get medications.  No further CSW needs at this time- please reconsult if assistance is needed closer to DC with patient returning to group home.  Jorge Ny, LCSW Clinical Social Worker [phone]

## 2016-12-10 ENCOUNTER — Inpatient Hospital Stay (HOSPITAL_COMMUNITY): Payer: Medicaid Other | Admitting: Certified Registered Nurse Anesthetist

## 2016-12-10 ENCOUNTER — Ambulatory Visit (HOSPITAL_COMMUNITY): Admission: RE | Admit: 2016-12-10 | Payer: Medicaid Other | Source: Ambulatory Visit | Admitting: Radiation Oncology

## 2016-12-10 ENCOUNTER — Encounter (HOSPITAL_COMMUNITY)
Admission: EM | Disposition: A | Discharge status: Home / Self Care | Payer: Self-pay | Source: Home / Self Care | Attending: Internal Medicine

## 2016-12-10 ENCOUNTER — Encounter (HOSPITAL_COMMUNITY): Payer: Self-pay | Admitting: *Deleted

## 2016-12-10 ENCOUNTER — Ambulatory Visit (HOSPITAL_COMMUNITY)
Admission: RE | Admit: 2016-12-10 | Discharge: 2016-12-10 | Disposition: A | Discharge status: Home / Self Care | Payer: Medicaid Other | Source: Ambulatory Visit | Attending: Radiation Oncology | Admitting: Radiation Oncology

## 2016-12-10 ENCOUNTER — Telehealth: Payer: Self-pay | Admitting: Oncology

## 2016-12-10 ENCOUNTER — Telehealth: Payer: Self-pay | Admitting: *Deleted

## 2016-12-10 DIAGNOSIS — C7931 Secondary malignant neoplasm of brain: Secondary | ICD-10-CM | POA: Diagnosis not present

## 2016-12-10 DIAGNOSIS — C7949 Secondary malignant neoplasm of other parts of nervous system: Secondary | ICD-10-CM | POA: Insufficient documentation

## 2016-12-10 HISTORY — PX: RADIOLOGY WITH ANESTHESIA: SHX6223

## 2016-12-10 SURGERY — RADIOLOGY WITH ANESTHESIA
Anesthesia: General

## 2016-12-10 MED ORDER — GADOBENATE DIMEGLUMINE 529 MG/ML IV SOLN
15.0000 mL | Freq: Once | INTRAVENOUS | Status: AC
  Administered 2016-12-10: 15 mL via INTRAVENOUS

## 2016-12-10 MED ORDER — PROMETHAZINE HCL 25 MG/ML IJ SOLN: 6.2500 mg | INTRAMUSCULAR | Status: DC | PRN

## 2016-12-10 MED ORDER — HEPARIN SOD (PORK) LOCK FLUSH 100 UNIT/ML IV SOLN
500.0000 [IU] | INTRAVENOUS | Status: AC | PRN
  Administered 2016-12-10: 500 [IU]

## 2016-12-10 MED ORDER — ONDANSETRON HCL 8 MG PO TABS: 8.0000 mg | ORAL_TABLET | Freq: Three times a day (TID) | ORAL | 0 refills | Status: DC | PRN

## 2016-12-10 MED ORDER — ALPRAZOLAM 1 MG PO TABS: 1.0000 mg | ORAL_TABLET | ORAL | 0 refills | Status: DC

## 2016-12-10 NOTE — Telephone Encounter (Signed)
Call from pt reporting she was to be discharged from hospital after MRI. She's had exam but was told their is no discharge order. Requesting call back from Dr. Virgie Dad RN.

## 2016-12-10 NOTE — Progress Notes (Deleted)
Miss [name] 56 y.o. woman y.o. woman Stage IV  Headache: Dizziness: Nausea/Vomiting/Ataxia: Visional changes(Blurred/double vision, blind spots and peripheral vision changes): Weight: Fatigue:  Stage IV M y.o. 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  Headache: Dizziness: Nausea/Vomiting/Ataxia: Visional changes(Blurred/double vision, blind spots and peripheral vision changes): Weight: Fatigue: adenocarcinoma of the left lower lung with bony and brain metastasis,S/P SRS left medulla, one month FU.  Headache: Dizziness: Nausea/Vomiting/Ataxia: Visional changes(Blurred/double vision, blind spots and peripheral vision changes): Weight: Fatigue:

## 2016-12-10 NOTE — Telephone Encounter (Signed)
RN from [facility] called to sch f/u for pt in two weeks. Gave RN appt date/time

## 2016-12-10 NOTE — Discharge Summary (Addendum)
Physician Discharge Summary   Patient ID: [name] MRN: [id number] DOB/AGE: 1961-01-02 56 y.o.  Admit date: 12/03/2016 Discharge date: 12/10/2016  Primary Care Physician:  Chauncey Cruel, MD  Discharge Diagnoses:    . Delirium due to another medical condition . Acute encephalopathy . Fibromyalgia . Brain metastasis ([place]) . Primary cancer of lower-inner quadrant of left female breast ([place]) . Bipolar I disorder, most recent episode depressed ([place])   Consults:  Oncology Psychiatry  Recommendations for Outpatient Follow-up:  1. Please repeat CBC/BMET at next visit 2. Follow lithium level in 5 days    DIET: Heart healthy diet    Allergies:   Allergies  Allergen Reactions  . Demerol Itching and Nausea And Vomiting  . Erythromycin Rash    Can take zpak     DISCHARGE MEDICATIONS: Current Discharge Medication List    CONTINUE these medications which have CHANGED   Details  ALPRAZolam (XANAX) 1 MG tablet Take 1 tablet (1 mg total) by mouth as directed. Take xanax 1mg  tab oral 30 minutes before radaition treatments and bedtime Qty: 15 tablet, Refills: 0    ondansetron (ZOFRAN) 8 MG tablet Take 1 tablet (8 mg total) by mouth every 8 (eight) hours as needed for nausea or vomiting. Qty: 30 tablet, Refills: 0   Associated Diagnoses: Nausea      CONTINUE these medications which have NOT CHANGED   Details  anastrozole (ARIMIDEX) 1 MG tablet Take 1 tablet (1 mg total) by mouth daily. Qty: 90 tablet, Refills: 1    cetirizine (ZYRTEC) 10 MG tablet Take 1 tablet (10 mg total) by mouth daily. Qty: 30 tablet, Refills: 11   Associated Diagnoses: Seasonal allergies    cholestyramine (QUESTRAN) 4 g packet Take 1 packet (4 g total) by mouth 3 (three) times daily with meals. Qty: 60 each, Refills: 12    dexamethasone (DECADRON) 2 MG tablet Take 1 tablet (2 mg total) by mouth daily. Qty: 30 tablet, Refills: 0    diphenoxylate-atropine (LOMOTIL) 2.5-0.025 MG tablet Take 2  tablets by mouth 4 (four) times daily as needed for diarrhea or loose stools. Qty: 40 tablet, Refills: 0    fluticasone (FLONASE) 50 MCG/ACT nasal spray Place 2 sprays into both nostrils daily. Qty: 15.8 g, Refills: 3    gabapentin (NEURONTIN) 300 MG capsule Take 2 capsules (600 mg total) by mouth 3 (three) times daily. 300 mg at night for one day, twice daily for one day, then three times daily Qty: 180 capsule, Refills: 2   Associated Diagnoses: Chronic pain syndrome; Fibromyalgia    lamoTRIgine (LAMICTAL) 100 MG tablet Take 1.5 tablets (150 mg total) by mouth 2 (two) times daily. Qty: 60 tablet, Refills: 1    lidocaine-prilocaine (EMLA) cream Apply 1 application topically as needed. Qty: 30 g, Refills: 0   Associated Diagnoses: Primary cancer of lower-inner quadrant of left female breast ([place]); Bone metastases ([facility])    lithium carbonate 300 MG capsule TAKE 1 CAPSULE BY MOUTH TWICE DAILY WITH A MEAL Qty: 60 capsule, Refills: 3    pantoprazole (PROTONIX) 40 MG tablet TAKE 1 TABLET BY MOUTH ONCE DAILY Qty: 30 tablet, Refills: 1   Associated Diagnoses: Nausea    potassium chloride (K-DUR,KLOR-CON) 10 MEQ tablet Take 10 mEq by mouth daily. Refills: 3    traMADol (ULTRAM) 50 MG tablet Take 1 tablet (50 mg total) by mouth every 6 (six) hours as needed. Qty: 60 tablet, Refills: 0   Associated Diagnoses: Primary cancer of lower-inner quadrant of left female breast (  [place]); Brain metastasis ([place])    traZODone (DESYREL) 150 MG tablet TAKE 2 TABLETS BY MOUTH AT BEDTIME. Qty: 60 tablet, Refills: 3    varenicline (CHANTIX) 0.5 MG tablet Take 1 tablet (0.5 mg total) by mouth daily. Qty: 30 tablet, Refills: 3    Benzocaine-Antipyrine 55-14 MG/ML SOLN 2-4 drops each ear three times a day. Wet cotton ball with drop and place in ear post instilling drops. Qty: 14 mL, Refills: 0    BIOTIN PO Take 1 capsule by mouth daily.    ibuprofen (ADVIL,MOTRIN) 200 MG tablet Take 800 mg by mouth every 6  (six) hours as needed for mild pain.    Loperamide HCl (IMODIUM PO) Take 1 tablet by mouth as needed (diarrhea).     MUCINEX D 60-600 MG 12 hr tablet TAKE 1 TABLET BY MOUTH 2 TIMES DAILY FOR 1 TO 2 WEEKS AS NEEDED FOR CONGESTION. Qty: 18 tablet, Refills: 0    MULTIPLE VITAMIN PO Take by mouth every morning.    potassium chloride (K-DUR) 10 MEQ tablet Take 1 tablet (10 mEq total) by mouth daily. Qty: 30 tablet, Refills: 3    tiZANidine (ZANAFLEX) 4 MG tablet Take 1 tablet (4 mg total) by mouth daily. Qty: 30 tablet, Refills: 0    Vitamin D, Cholecalciferol, 1000 units CAPS Take 1,000 Units by mouth daily.    Wound Dressings (SONAFINE) Apply 1 application topically daily. Apply to head after rad txs whern skin becomes itchy or irritated,      STOP taking these medications     amoxicillin-clavulanate (AUGMENTIN) 875-125 MG tablet          Brief H and P: For complete details please refer to admission H and P, but in brief [name] is a 56 y.o. female with medical history of metastatic breast cancer to bone and brain, depression, fibromyalgia, remote alcohol abuse, bipolar disorder, and chronic pain syndrome. The patient last received chemotherapy with Herceptin/Perjeta on 11/17/2016 under the care of Dr. [name]. In addition, the patient has received brain radiation from 09/17/2016 through 10/07/2016. She has also previously received radiation to the chest wall which she finished on 06/26/2016. In addition, the patient was recently underwent craniectomy with resection of a metastatic lesion on her left cerebellum on 08/14/2016 performed by  Dr. Vertell Limber. The patient is encephalopathic and is unable to provide any history. All of this history is obtained from review of the medical record in speaking to the patient's sister. For the last 3-4 weeks, family has noted that the patient has had a cognitive and functional decline with increasing generalized weakness. In addition, the patient has  chronic loose stools which has been going on for many months. The patient had not called her family for a period of 3-4 days until 11/30/2016. At that time, the patient was slurring her words and appeared to be confused on the telephone. Her family urged her caretaker (Cletus) and the patient to go to ED.     Hospital Course:  Acute encephalopathy- resolved -serum B12, TSH, RPR, HIV, ammonia, UA and urine culture - all have been WNL at this time -she is positive for Benzodiazepines  and opiates on her UDS -Given patient has a history of stage IV left breast cancer involving bone and central nervous system, oncology was consulted and recommended MRI prior to discharge   - Psychiatry was also consulted, and evaluated the patient, she has understanding of medical condition and has capacity to make her own medical decisions. Psychiatry recommended  restarting lithium 300 mg twice a day, lamotrigine 150 mg twice a day, trazodone 150 mg at bedtime, gabapentin 600 mg 3 times a day -  MRI of the brain showed no residual or recurrent metastatic tumor or any evidence of leptomeningeal disease, no new metastatic disease to the brain, no acute intracranial abnormality.  Diarrhea- improved  - C diff - neg  -Gi panel pending still  - apparently her diarrhea has been chronic since chemotherapy.   Metastatic breast cancer - has had chemo/radiation in the past -continue arimidex -will likely benefit from Palliative care to determine long term goals of care.   Bipolar disorder -Psych medications restarted, will need to check lithium levels in 5 days.    Day of Discharge BP 105/69   Pulse 88   Temp 98.2 F (36.8 C)   Resp 14   Ht 5\' 5"  (1.651 m)   Wt 67.8 kg (149 lb 7.6 oz)   SpO2 98%   BMI 24.87 kg/m   Physical Exam: General: Alert and awake oriented x3 not in any acute distress. HEENT: anicteric sclera, pupils reactive to light and accommodation CVS: S1-S2 clear no murmur rubs or  gallops Chest: clear to auscultation bilaterally, no wheezing rales or rhonchi Abdomen: soft nontender, nondistended, normal bowel sounds Extremities: no cyanosis, clubbing or edema noted bilaterally Neuro: Cranial nerves II-XII intact, no focal neurological deficits   The results of significant diagnostics from this hospitalization (including imaging, microbiology, ancillary and laboratory) are listed below for reference.    LAB RESULTS: Basic Metabolic Panel:  Recent Labs Lab 12/04/16 0622  NA 141  K 3.9  CL 111  CO2 22  GLUCOSE 109*  BUN 13  CREATININE 0.67  CALCIUM 8.3*  MG 2.2   Liver Function Tests: No results for input(s): AST, ALT, ALKPHOS, BILITOT, PROT, ALBUMIN in the last 168 hours. No results for input(s): LIPASE, AMYLASE in the last 168 hours.  Recent Labs Lab 12/03/16 1609 12/03/16 1843  AMMONIA 20 17   CBC:  Recent Labs Lab 12/04/16 0622  WBC 7.1  HGB 12.8  HCT 37.1  MCV 95.4  PLT 204   Cardiac Enzymes: No results for input(s): CKTOTAL, CKMB, CKMBINDEX, TROPONINI in the last 168 hours. BNP: Invalid input(s): POCBNP CBG: No results for input(s): GLUCAP in the last 168 hours.  Significant Diagnostic Studies:  Mr Jeri Cos [id number] Contrast  Result Date: 12/10/2016 CLINICAL DATA:  57 year old female with metastatic breast cancer. Status post resection of left cerebellar metastasis in September. Recent Altered mental status. Subsequent encounter. EXAM: MRI HEAD WITHOUT AND WITH CONTRAST TECHNIQUE: Multiplanar, multiecho pulse sequences of the brain and surrounding structures were obtained without and with intravenous contrast. CONTRAST:  18mL MULTIHANCE GADOBENATE DIMEGLUMINE 529 MG/ML IV SOLN COMPARISON:  Head CT without contrast 12/03/2016. Post treatment brain MRI 09/08/2016 and earlier. FINDINGS: Brain: No restricted diffusion or evidence of acute infarction. No ventriculomegaly. No acute intracranial hemorrhage identified. Sequelae of left suboccipital  craniectomy. The residual stellate enhancement described in October at the left cerebellar resection site has regressed (series 10, image 30 today versus series 10, image 18 previously. Mild overlying dural thickening appears stable and is likely postoperative. No strong evidence of leptomeningeal metastatic disease today. No cerebellar edema. No posterior fossa mass effect. No new or abnormal intracranial enhancement elsewhere. Stable gray and white matter signal throughout the brain. No encephalomalacia. No chronic cerebral blood products. Negative pituitary and cervicomedullary junction. Vascular: Major intracranial vascular flow voids are stable and within normal limits.  Skull and upper cervical spine: Negative visualized cervical spinal cord. Visible bone marrow signal is stable and within normal limits. Sinuses/Orbits: Visualized paranasal sinuses and mastoids are stable and well pneumatized. Orbits soft tissues appear stable and normal. Other: Visible internal auditory structures appear normal. No acute scalp soft tissue findings. IMPRESSION: 1. Satisfactory post treatment appearance with regressed enhancement at the left cerebellar resection site since October. No residual or recurrent metastatic tumor suspected. No evidence of leptomeningeal disease. No new metastatic disease to the brain. 2.  No acute intracranial abnormality. Electronically Signed   By: Genevie Ann M.D.   On: 12/10/2016 10:47    2D ECHO:   Disposition and Follow-up: Discharge Instructions    Diet - low sodium heart healthy    Complete by:  As directed    Increase activity slowly    Complete by:  As directed        DISPOSITION: home    Murray C, MD. Schedule an appointment as soon as possible for a visit in 2 week(s).   Specialty:  Oncology Contact information: [place] [address] [phone]            Time spent on  Discharge: 62mins   Signed:   Nikkolas Coomes M.D. [facility] 12/10/2016, 2:05 PM Pager: [phone]  Coding query:  Acute encephalopathy: likely due to acute metabolic encephalopathy   Devinne Epstein M.D. [facility] 12/16/2016, 3:30 PM  Pager: [phone]   Addendum  Coding query:  Acute encephalopathy: likely due to acute metabolic encephalopathy, clinically undetermined     Cassidy Tabet M.D. [facility] 12/23/2016, 12:44 PM  Pager: [phone]

## 2016-12-10 NOTE — Telephone Encounter (Signed)
This RN returned call to pt and assigned nurse on 5 W at [facility] to inform them d/c needs to be done by admitting MD which is hospitalist.  Informed nurse Dr [name] has noted d/c approval per his note.  Jillena inquired if RN had results of MRI of brain completed this AM - this RN informed pt results not available for RN request inpt RN to have hospitalist discuss results for appropriate follow up.  No other needs at this time.

## 2016-12-10 NOTE — Progress Notes (Signed)
[name]  D/C'd  per MD order.  Discussed with the patient and all questions fully answered.  VSS, Skin clean, dry and intact without evidence of skin break down, no evidence of skin tears noted. Central line  Discontinued by IV nurse and intact. Site without signs and symptoms of complications. Dressing and pressure applied.  An After Visit Summary was printed and given to the patient. Patient received prescription.  D/c education completed with patient/family including follow up instructions, medication list, d/c activities limitations if indicated, with other d/c instructions as indicated by MD - patient able to verbalize understanding, all questions fully answered.   Patient instructed to return to ED, call 911, or call MD for any changes in condition.   Patient escorted via [place], and D/C home via private auto.  [name] 12/10/2016 2:26 PM

## 2016-12-10 NOTE — Anesthesia Postprocedure Evaluation (Signed)
Anesthesia Post Note  Patient: Enya Wuebben  Procedure(s) Performed: Procedure(s) (LRB): MRI OF BRAIN WITH AND WITHOUT (N/A)  Patient location during evaluation: PACU Anesthesia Type: General Level of consciousness: awake and alert Pain management: pain level controlled Vital Signs Assessment: post-procedure vital signs reviewed and stable Respiratory status: spontaneous breathing, nonlabored ventilation, respiratory function stable and patient connected to nasal cannula oxygen Cardiovascular status: blood pressure returned to baseline and stable Postop Assessment: no signs of nausea or vomiting Anesthetic complications: no       Last Vitals:  Vitals:   12/10/16 0942 12/10/16 1000  BP: 102/67 105/69  Pulse: 92 88  Resp: 14   Temp: 36.6 C 36.8 C    Last Pain:  Vitals:   12/10/16 0942  TempSrc:   PainSc: 0-No pain                 Tiajuana Amass

## 2016-12-10 NOTE — Transfer of Care (Signed)
Immediate Anesthesia Transfer of Care Note  Patient: [name]  Procedure(s) Performed: Procedure(s): MRI OF BRAIN WITH AND WITHOUT (N/A)  Patient Location: PACU  Anesthesia Type:General  Level of Consciousness: awake, oriented and patient cooperative  Airway & Oxygen Therapy: Patient Spontanous Breathing  Post-op Assessment: Report given to RN, Post -op Vital signs reviewed and stable and Patient moving all extremities  Post vital signs: Reviewed and stable  Last Vitals:  Vitals:   12/10/16 0942 12/10/16 1000  BP: 102/67 105/69  Pulse: 92 88  Resp: 14   Temp: 36.6 C     Last Pain:  Vitals:   12/10/16 0942  TempSrc:   PainSc: 0-No pain      Patients Stated Pain Goal: 0 ([id number])  Complications: No apparent anesthesia complications

## 2016-12-11 ENCOUNTER — Encounter (HOSPITAL_COMMUNITY): Payer: Self-pay | Admitting: Radiology

## 2016-12-12 ENCOUNTER — Other Ambulatory Visit: Payer: Self-pay | Admitting: Oncology

## 2016-12-12 DIAGNOSIS — R110 Nausea: Secondary | ICD-10-CM

## 2016-12-14 ENCOUNTER — Other Ambulatory Visit: Payer: Self-pay | Admitting: Radiation Therapy

## 2016-12-14 ENCOUNTER — Ambulatory Visit
Admission: RE | Admit: 2016-12-14 | Discharge: 2016-12-14 | Disposition: A | Discharge status: Home / Self Care | Payer: Medicaid Other | Source: Ambulatory Visit | Attending: Radiation Oncology | Admitting: Radiation Oncology

## 2016-12-14 ENCOUNTER — Telehealth: Payer: Self-pay | Admitting: Radiation Therapy

## 2016-12-14 DIAGNOSIS — C7949 Secondary malignant neoplasm of other parts of nervous system: Secondary | ICD-10-CM

## 2016-12-14 DIAGNOSIS — C7931 Secondary malignant neoplasm of brain: Secondary | ICD-10-CM

## 2016-12-14 NOTE — Telephone Encounter (Signed)
[name] was scheduled to see [name] 1/15 to discuss her recent brain MRI results. She missed this appointment and rather than reschedule, [name] requested that I call with the good report and set things up for another scan with follow-up in 3 months.   I shared the favorable MRI results, that there is no evidence of residual or new metastatic disease in her brain. [name] was very happy to hear this. I let her know that we will schedule her for another MRI in 3 months and have her come in to see [name] at that time.She is in agreement with this plan.   Mont Dutton R.T.(R)(T) Special Procedures Navigator

## 2016-12-15 ENCOUNTER — Telehealth: Payer: Self-pay | Admitting: *Deleted

## 2016-12-15 ENCOUNTER — Encounter (HOSPITAL_COMMUNITY): Payer: Self-pay | Admitting: Emergency Medicine

## 2016-12-15 ENCOUNTER — Ambulatory Visit: Payer: Self-pay

## 2016-12-15 ENCOUNTER — Ambulatory Visit (HOSPITAL_COMMUNITY)
Admission: RE | Admit: 2016-12-15 | Discharge: 2016-12-15 | Disposition: A | Discharge status: Home / Self Care | Payer: Medicaid Other | Attending: Psychiatry | Admitting: Psychiatry

## 2016-12-15 ENCOUNTER — Other Ambulatory Visit: Payer: Self-pay | Admitting: Oncology

## 2016-12-15 MED FILL — Ondansetron HCl Inj 4 MG/2ML (2 MG/ML): INTRAMUSCULAR | Qty: 2 | Status: AC

## 2016-12-15 MED FILL — Midazolam HCl Inj 2 MG/2ML (Base Equivalent): INTRAMUSCULAR | Qty: 2 | Status: AC

## 2016-12-15 MED FILL — Ephedrine Sulf-NaCl Soln Pref Syr 50 MG/10ML-0.9% (5 MG/ML): INTRAVENOUS | Qty: 10 | Status: AC

## 2016-12-15 MED FILL — Dexamethasone Sodium Phosphate Inj 10 MG/ML: INTRAMUSCULAR | Qty: 1 | Status: AC

## 2016-12-15 MED FILL — Lidocaine HCl Local Soln Prefilled Syringe 100 MG/5ML (2%): INTRAMUSCULAR | Qty: 5 | Status: AC

## 2016-12-15 MED FILL — Propofol IV Emul 200 MG/20ML (10 MG/ML): INTRAVENOUS | Qty: 20 | Status: AC

## 2016-12-15 NOTE — H&P (Signed)
Behavioral Health Medical Screening Exam  [name] is an 56 y.o. female.  Total Time spent with patient: 20 minutes  Psychiatric Specialty Exam: Physical Exam  Constitutional: She is oriented to person, place, and time. She appears well-developed and well-nourished.  HENT:  Head: Normocephalic and atraumatic.  Neck: Normal range of motion.  Cardiovascular: Normal rate and normal heart sounds.   Respiratory: Effort normal.  GI: Soft. Bowel sounds are normal.  Musculoskeletal: Normal range of motion.  Neurological: She is alert and oriented to person, place, and time.  Skin: Skin is warm and dry.    Review of Systems  Psychiatric/Behavioral: Positive for depression. Negative for hallucinations, memory loss, substance abuse and suicidal ideas. The patient is nervous/anxious and has insomnia.   All other systems reviewed and are negative.   Blood pressure 127/71, pulse 88, temperature 97.8 F (36.6 C), temperature source Oral, resp. rate 18, SpO2 98 %.There is no height or weight on file to calculate BMI.  General Appearance: Casual and Fairly Groomed  Eye Contact:  Good  Speech:  Clear and Coherent and Normal Rate  Volume:  Normal  Mood:  Anxious and Depressed  Affect:  Congruent and Depressed  Thought Process:  Coherent  Orientation:  Full (Time, Place, and Person)  Thought Content:  Logical  Suicidal Thoughts:  No  Homicidal Thoughts:  No  Memory:  Immediate;   Good Recent;   Fair Remote;   Fair  Judgement:  Fair  Insight:  Fair  Psychomotor Activity:  Normal  Concentration: Concentration: Fair and Attention Span: Fair  Recall:  [employer] of Knowledge:Good  Language: Good  Akathisia:  No  Handed:  Right  AIMS (if indicated):     Assets:  Communication Skills Desire for Improvement Financial Resources/Insurance Housing Leisure Time Resilience Social Support Transportation  Sleep:       Musculoskeletal: Strength & Muscle Tone: within normal limits Gait  & Station: normal Patient leans: N/A  Blood pressure 127/71, pulse 88, temperature 97.8 F (36.6 C), temperature source Oral, resp. rate 18, SpO2 98 %.  Recommendations:  Based on my evaluation the patient does not appear to have an emergency medical condition. Pt does not meet criteria for inpatient psychiatric admission. Resources provided for outpatient follow up. Pt will follow up with [facility] [place] for medication management.  Ethelene Hal, NP 12/15/2016, 2:33 PM

## 2016-12-15 NOTE — Telephone Encounter (Signed)
This RN attempted to return call to [place] and obtained verified VM.  Message left to return call to this RN - direct RN number given as well as concern with pt status may be more related to known mental health issues not breast cancer.  Per recent restaging studies pt has no known active disease and reiterated possible need for pt to proceed to the ER.

## 2016-12-15 NOTE — Progress Notes (Unsigned)
[name] returns today for an unscheduled visit. She was brought by [name] who runs the halfway house where she lives. [name] was discharged from the hospital about a week ago and was doing quite well at that time but by his and her account she has been deteriorated since. She is really not capable of caring for herself right now, she is crying all the time, she feels overwhelmed. She says she has been taking her medications. [name] feels it is not safe for her to be where she is right now.  We discussed the fact that right now her cancer appears to be well-controlled and there is no evidence of active disease in her brain.  I called the emergency room to have her evaluated there and they told me she can go straight to behavioral health and that is what we are doing.  It may be that she needs to go back to [place] where she had more of a support group and where she was being seen regularly by psychiatry and  Psychology. If she does stating [name] it will be very important for her to be put in contact with the relevant psychiatric support.

## 2016-12-15 NOTE — Telephone Encounter (Addendum)
"  [name] Dir. [facility] [phone]).  [name] is showing signs of rapid decline.  She's in and out of clarity,  Texting me repeatedly text that read H-E-L-P.  She's not able to do the simple things of life for independent living.  She's wobbly at risk for fall.  She was recently hospitalized with confusion, didn't recognize me or know who she was and I see signs returning.  Needs to be re-admitted and maybe assisted living.  This is independent living.  She's in a private room with a private bathroom.  Her doctors need to be involved."  Will notify providers.  Advised he call 911 if she is declining.

## 2016-12-15 NOTE — Telephone Encounter (Signed)
This RN spoke with pt and her friend and co worker per unscheduled visit.  [name] is alert and oriented. Denies any self harm, suicide or harm to others.  She is emotional stating " I am shaking again "and holds her hands out with visible shaking.  Per [place] he states and shows phone text of pt texts of letters but not sentences then text with H-E--L-P from pt.  When you ask [name] how we can help she cannot state - except " I need help "  This RN inquired if pt is taking mental health meds post d/c with [facility] stating " I don't know, I need a list ..."  [name] tears up, and puts her head in her hands and states " I just don't know "  Per above this RN contacted MD and SW for assistance with appropriate recommendations.  Per MD assessment and contact with ER pt should proceed to the [facility] for assessment.  [facility] states he can take pt directly there.  Above explained to [name] with goal of care to help her be as strong as she can and get her medications adjusted so she can think and care for herself as she has done before.  [name] is in agreement to plan.  She gave this RN phone number for her sister [name] to contact with current situation.  This RN contacted [name] at [phone] and informed her of above scenerio.  Pt d/ced with [place]

## 2016-12-15 NOTE — BH Assessment (Signed)
Tele Assessment Note  Pt presents voluntarily to [facility] from [place] accompanied by Cleatis Brunson, director of [place], a recovery house where pt lives.  Pt denies SI currently or at any time in the past. Pt denies any history of suicide attempts, or of self-mutilation. Pt denies homicidal thoughts or physical aggression. Pt denies having access to firearms. Pt denies having any legal problems at this time. Pt is calm and cooperative during assessment.Pt denies hallucinations. Pt does not appear to be responding to internal stimuli and exhibits no delusional thought. Pt's reality testing appears to be intact. She is oriented to person, place and situation but she doesn't know the current month. Pt cooperative and teary on occasion. She says, "I'm confused as far as my medications go." She sts all her psych meds were stopped last week while she was in the hospital. She sts she started them again last night. She reports prior dx of bipolar disorder. She sts she went to [place] for a long time for med management and group therapy. Pt sts she hasn't been to [facility] in several mos. She reports no hx of inpatient psych admissions. Pt does not appear to be intoxicated or in withdrawal at this time. She endorses tearfulness, fatigue, isolating, insomnia and loss of interest in usual activities. Pt struggles at times with finding correct words when replying. She is cold and has blanket during first part of assessment but then becomes hot. She is mostly bald except for some wisps of hair. Pt cries and says she has been through a lot.  Collateral info provided by Brunson [phone]. He says pt isn't acting like herself. He says that she isolates and is somewhat confused. Per chart review, pt was admitted to [facility] last week for confusion. She was dx with Stage IV L breast cancer in June 2016. Brunson reports pt just had MRI which shows she is cancer  free. He says pt has lived in his recovery house off and on for 4 yrs. He says that usually she is full of energy and outgoing. Brunson reports pt has been clean and sober from drugs and alcohol for 5 yrs. Brunson expressed concern with pt being confused about her meds and the confusion pt has shown recently. He says that pt most likely needs to come into [facility] for a few days or else go to assisted living, which pt refuses to discuss.   [name] is an 56 y.o. female.   Diagnosis: Bipolar I Disorder, Most Recent Episode Depressed.   Past Medical History:  Past Medical History:  Diagnosis Date  . Alcohol abuse   . Anemia    during chemo  . Anxiety    At age 20  . Arthritis Dx 2010  . Bipolar disorder ([place])   . Cancer ([place])    breast mets to brain  . Chronic pain   . Complication of anesthesia   . Depression   . Fibromyalgia Dx 2005  . GERD (gastroesophageal reflux disease)   . Headache    hx  migraines  . Opiate dependence ([place])   . PONV (postoperative nausea and vomiting)   . Port-a-cath in place   . PTSD (post-traumatic stress disorder)     Past Surgical History:  Procedure Laterality Date  . APPLICATION OF CRANIAL NAVIGATION N/A 08/14/2016   Procedure: APPLICATION OF CRANIAL NAVIGATION;  Surgeon: Erline Levine, MD;  Location: [place] NEURO ORS;  Service: Neurosurgery;  Laterality: N/A;  . BREAST RECONSTRUCTION Left    with silicone implant  . CRANIOTOMY N/A 08/14/2016   Procedure: CRANIOTOMY TUMOR EXCISION WITH Lucky Rathke;  Surgeon: Erline Levine, MD;  Location: [place] NEURO ORS;  Service: Neurosurgery;  Laterality: N/A;  . FIBULA FRACTURE SURGERY Left   . MASTECTOMY Left   . RADIOLOGY WITH ANESTHESIA N/A 07/23/2016   Procedure: MRI OF BRAIN WITH AND WITHOUT;  Surgeon: Medication Radiologist, MD;  Location: [facility];  Service: Radiology;  Laterality: N/A;  . RADIOLOGY WITH ANESTHESIA N/A 09/08/2016   Procedure: MRI OF BRAIN WITH AND WITHOUT CONTRAST;  Surgeon: Medication Radiologist, MD;   Location: [place];  Service: Radiology;  Laterality: N/A;  . RADIOLOGY WITH ANESTHESIA N/A 12/10/2016   Procedure: MRI OF BRAIN WITH AND WITHOUT;  Surgeon: Medication Radiologist, MD;  Location: [facility];  Service: Radiology;  Laterality: N/A;  . right power port placement Right     Family History:  Family History  Problem Relation Age of Onset  . Diabetes Mother   . Bipolar disorder Mother   . CAD Father     Social History:  reports that she has been smoking Cigarettes.  She has been smoking about 0.50 packs per day. She has never used smokeless tobacco. She reports that she does not drink alcohol or use drugs.  Additional Social History:  Alcohol / Drug Use Pain Medications: pt denies abuse - see pta meds list Prescriptions: pt denies abuse - see pta meds list Over the Counter: pt denies abuse - see pta meds list History of alcohol / drug use?: Yes Longest period of sobriety (when/how long): pt reports being clean and sober for past 5 yrs  CIWA: CIWA-Ar BP: 105/69 Pulse Rate: 88 COWS:    PATIENT STRENGTHS: (choose at least two) Average or above average intelligence Communication skills Supportive family/friends  Allergies:  Allergies  Allergen Reactions  . Demerol Itching and Nausea And Vomiting  . Erythromycin Rash    Can take zpak    Home Medications:  No prescriptions prior to admission.    OB/GYN Status:  No LMP recorded. Patient has had an ablation.  General Assessment Data Location of Assessment: [facility] TTS Assessment: In system Is this a Tele or Face-to-Face Assessment?: Face-to-Face Is this an Initial Assessment or a Re-assessment for this encounter?: Initial Assessment Marital status: Single Is patient pregnant?: No Pregnancy Status: No Living Arrangements: Other (Comment) ([facility]) Can pt return to current living arrangement?: Yes Admission Status: Voluntary Is patient capable of signing voluntary admission?:  Yes Referral Source: Self/Family/Friend Insurance type: medicaid  Medical Screening Exam ([place]) Medical Exam completed: Yes  Crisis Care Plan Living Arrangements: Other (Comment) ([facility]) Name of Psychiatrist: none Name of Therapist: none  Education Status Is patient currently in school?: No  Risk to self with the past 6 months Suicidal Ideation: No Has patient been a risk to self within the past 6 months prior to admission? : No Suicidal Intent: No Has patient had any suicidal intent within the past 6 months prior to admission? : No Is patient at risk for suicide?: No Suicidal Plan?: No Has patient had any suicidal plan within the past 6 months prior to admission? : No Access to Means: No What has been your use of drugs/alcohol within the last 12 months?: pt has been clean and sober for 5 yrs Previous Attempts/Gestures: No How many times?: 0 Other Self Harm Risks: none Triggers for Past Attempts:  (  n/a) Intentional Self Injurious Behavior: None Recent stressful life event(s): Other (Comment) (completed current cancer treatment) Persecutory voices/beliefs?: No Depression: Yes Depression Symptoms: Tearfulness, Fatigue, Insomnia, Loss of interest in usual pleasures, Isolating Substance abuse history and/or treatment for substance abuse?: Yes Suicide prevention information given to non-admitted patients: Not applicable  Risk to Others within the past 6 months Homicidal Ideation: No Does patient have any lifetime risk of violence toward others beyond the six months prior to admission? : No Thoughts of Harm to Others: No Current Homicidal Intent: No Current Homicidal Plan: No Access to Homicidal Means: No Identified Victim: n/a History of harm to others?: No Assessment of Violence: None Noted Violent Behavior Description: pt denies hx violence Does patient have access to weapons?: No Criminal Charges Pending?: No Does patient have a court  date: No Is patient on probation?: No  Psychosis Hallucinations: None noted Delusions: None noted  Mental Status Report Appearance/Hygiene: Other (Comment) (bald except for few wisps of hair) Eye Contact: Good Motor Activity: Freedom of movement, Restlessness Speech: Logical/coherent Level of Consciousness: Alert, Crying Mood: Depressed, Sad, Anhedonia Affect: Appropriate to circumstance, Anxious, Sad, Depressed Anxiety Level: Minimal Thought Processes: Relevant, Coherent Judgement: Unimpaired Orientation: Person, Place, Situation Obsessive Compulsive Thoughts/Behaviors: None  Cognitive Functioning Concentration: Decreased Memory: Recent Impaired, Remote Intact IQ: Average Insight: Fair Impulse Control: Fair Appetite: Good Sleep: Decreased Total Hours of Sleep: 3 Vegetative Symptoms: None  ADLScreening [facility] Services) Patient's cognitive ability adequate to safely complete daily activities?: Yes Patient able to express need for assistance with ADLs?: Yes Independently performs ADLs?: Yes (appropriate for developmental age)  Prior Inpatient Therapy Prior Inpatient Therapy: No  Prior Outpatient Therapy Prior Outpatient Therapy: Yes Prior Therapy Dates: has been several mos ago Prior Therapy Facilty/Provider(s): [facility] Reason for Treatment: med managememt, group therapy Does patient have an ACCT team?: No Does patient have Intensive In-House Services?  : No Does patient have [facility] services? : Unknown Does patient have P4CC services?: Unknown  ADL Screening (condition at time of admission) Patient's cognitive ability adequate to safely complete daily activities?: Yes Is the patient deaf or have difficulty hearing?: Yes Does the patient have difficulty seeing, even when wearing glasses/contacts?: No Does the patient have difficulty concentrating, remembering, or making decisions?: Yes Patient able to express need for assistance with  ADLs?: Yes Does the patient have difficulty dressing or bathing?: No Independently performs ADLs?: Yes (appropriate for developmental age) Communication: Needs assistance Is this a change from baseline?: Pre-admission baseline Dressing (OT): Needs assistance Is this a change from baseline?: Pre-admission baseline Grooming: Needs assistance Is this a change from baseline?: Pre-admission baseline Feeding: Needs assistance Is this a change from baseline?: Pre-admission baseline Bathing: Needs assistance Is this a change from baseline?: Pre-admission baseline Toileting: Needs assistance Is this a change from baseline?: Pre-admission baseline In/Out Bed: Needs assistance Is this a change from baseline?: Pre-admission baseline Walks in Home: Dependent Is this a change from baseline?: Pre-admission baseline Does the patient have difficulty walking or climbing stairs?: No Weakness of Legs: None Weakness of Arms/Hands: Both  Home Assistive Devices/Equipment Home Assistive Devices/Equipment: None  Therapy Consults (therapy consults require a physician order) PT Evaluation Needed: No OT Evalulation Needed: No SLP Evaluation Needed: No Abuse/Neglect Assessment (Assessment to be complete while patient is alone) Physical Abuse: Denies Verbal Abuse: Denies Sexual Abuse: Denies Exploitation of patient/patient's resources: Denies Self-Neglect: Denies Values / Beliefs Cultural Requests During Hospitalization: None (per previous hx) Spiritual Requests During Hospitalization: None (per previous  hx) Consults Spiritual Care Consult Needed: No Social Work Consult Needed: No Regulatory affairs officer (For Healthcare) Does Patient Have a Medical Advance Directive?: No Would patient like information on creating a medical advance directive?: No - Patient declined Nutrition Screen- [facility] Adult/WL/AP Patient's home diet: Regular Has the patient recently lost weight without trying?: No Has the patient been  eating poorly because of a decreased appetite?: No Malnutrition Screening Tool Score: 0  Additional Information 1:1 In Past 12 Months?: No CIRT Risk: No Elopement Risk: No Does patient have medical clearance?: No     Disposition:  Disposition Initial Assessment Completed for this Encounter: Yes Disposition of Patient: Outpatient treatment Type of outpatient treatment: Psych Intensive Outpatient ([facility] referred [facility] outpatient IOP)   Writer ran pt by Jinny Blossom NP who recommends [facility] IOP for pt. Writer spoke w/ pt re: [facility] IOP and pt is cooperative and says she is willing to do whatever to get back on her meds. Writer called [facility] several times but phone was busy. Writer then sent email to Dellia Nims of [facility] to tell her about pt. Pt may also qualify for the [facility]-IOP but she has been sober for 5 yrs, so [facility]IOP will likely be better fit. Writer gave pt contact info to [facility] IOP including new address.  Christy Friede P 12/15/2016 2:55 PM

## 2016-12-16 ENCOUNTER — Other Ambulatory Visit: Payer: Self-pay | Admitting: Oncology

## 2016-12-17 ENCOUNTER — Encounter: Payer: Self-pay | Admitting: General Practice

## 2016-12-17 NOTE — Progress Notes (Signed)
[facility] Spiritual Care Note  Due to pt's schedule change because of inclement weather, I left [name] a VM of check-in and encouragement.  Hope to see her next week following her appt with Dr [name].  Please also page if constructive time or immediate need arises.  Thank you.   [place], [place], [facility] Pager [phone] Voicemail [phone]

## 2016-12-18 ENCOUNTER — Telehealth: Payer: Self-pay | Admitting: *Deleted

## 2016-12-18 ENCOUNTER — Encounter (HOSPITAL_COMMUNITY): Payer: Self-pay | Admitting: Emergency Medicine

## 2016-12-18 ENCOUNTER — Emergency Department (HOSPITAL_COMMUNITY)
Admission: EM | Admit: 2016-12-18 | Discharge: 2016-12-19 | Disposition: A | Discharge status: Home / Self Care | Payer: Medicaid Other | Attending: Dermatology | Admitting: Dermatology

## 2016-12-18 DIAGNOSIS — R4182 Altered mental status, unspecified: Secondary | ICD-10-CM | POA: Diagnosis present

## 2016-12-18 DIAGNOSIS — Z853 Personal history of malignant neoplasm of breast: Secondary | ICD-10-CM | POA: Insufficient documentation

## 2016-12-18 DIAGNOSIS — F17210 Nicotine dependence, cigarettes, uncomplicated: Secondary | ICD-10-CM | POA: Insufficient documentation

## 2016-12-18 DIAGNOSIS — R531 Weakness: Secondary | ICD-10-CM | POA: Diagnosis not present

## 2016-12-18 DIAGNOSIS — Z79899 Other long term (current) drug therapy: Secondary | ICD-10-CM | POA: Diagnosis not present

## 2016-12-18 LAB — URINALYSIS, ROUTINE W REFLEX MICROSCOPIC
Bilirubin Urine: NEGATIVE
Glucose, UA: NEGATIVE mg/dL
Ketones, ur: 80 mg/dL — AB
Nitrite: NEGATIVE
Protein, ur: NEGATIVE mg/dL
Specific Gravity, Urine: 1.016 (ref 1.005–1.030)
Squamous Epithelial / HPF: NONE SEEN
pH: 6 (ref 5.0–8.0)

## 2016-12-18 LAB — COMPREHENSIVE METABOLIC PANEL
ALT: 28 U/L (ref 14–54)
AST: 26 U/L (ref 15–41)
Albumin: 4.5 g/dL (ref 3.5–5.0)
Alkaline Phosphatase: 48 U/L (ref 38–126)
Anion gap: 11 (ref 5–15)
BUN: 6 mg/dL (ref 6–20)
CO2: 25 mmol/L (ref 22–32)
Calcium: 9.7 mg/dL (ref 8.9–10.3)
Chloride: 103 mmol/L (ref 101–111)
Creatinine, Ser: 0.98 mg/dL (ref 0.44–1.00)
GFR calc Af Amer: >60 mL/min (ref >60)
GFR calc non Af Amer: >60 mL/min (ref >60)
Glucose, Bld: 116 mg/dL — ABNORMAL HIGH (ref 65–99)
Potassium: 3.5 mmol/L (ref 3.5–5.1)
Sodium: 139 mmol/L (ref 135–145)
Total Bilirubin: 0.4 mg/dL (ref 0.3–1.2)
Total Protein: 6.8 g/dL (ref 6.5–8.1)

## 2016-12-18 LAB — CBC
HCT: 45.2 % (ref 36.0–46.0)
Hemoglobin: 15.1 g/dL — ABNORMAL HIGH (ref 12.0–15.0)
MCH: 32.8 pg (ref 26.0–34.0)
MCHC: 33.4 g/dL (ref 30.0–36.0)
MCV: 98 fL (ref 78.0–100.0)
Platelets: 216 10*3/uL (ref 150–400)
RBC: 4.61 MIL/uL (ref 3.87–5.11)
RDW: 12.8 % (ref 11.5–15.5)
WBC: 6.5 10*3/uL (ref 4.0–10.5)

## 2016-12-18 LAB — LITHIUM LEVEL: Lithium Lvl: 0.8 mmol/L (ref 0.60–1.20)

## 2016-12-18 LAB — RAPID URINE DRUG SCREEN, HOSP PERFORMED
Amphetamines: NOT DETECTED
Barbiturates: NOT DETECTED
Benzodiazepines: POSITIVE — AB
Cocaine: NOT DETECTED
Opiates: NOT DETECTED
Tetrahydrocannabinol: NOT DETECTED

## 2016-12-18 LAB — CBG MONITORING, ED: Glucose-Capillary: 100 mg/dL — ABNORMAL HIGH (ref 65–99)

## 2016-12-18 MED ORDER — SODIUM CHLORIDE 0.9 % IV BOLUS (SEPSIS)
1000.0000 mL | Freq: Once | INTRAVENOUS | Status: AC
  Administered 2016-12-18: 1000 mL via INTRAVENOUS

## 2016-12-18 MED ORDER — ALPRAZOLAM 1 MG PO TABS
1.0000 mg | ORAL_TABLET | Freq: Once | ORAL | Status: AC
  Administered 2016-12-18: 1 mg via ORAL
  Filled 2016-12-18: qty 1

## 2016-12-18 MED ORDER — CEPHALEXIN 500 MG PO CAPS
500.0000 mg | ORAL_CAPSULE | Freq: Once | ORAL | Status: AC
  Administered 2016-12-18: 500 mg via ORAL
  Filled 2016-12-18: qty 1

## 2016-12-18 MED ORDER — CEPHALEXIN 500 MG PO CAPS: 500.0000 mg | ORAL_CAPSULE | Freq: Four times a day (QID) | ORAL | 0 refills | Status: DC

## 2016-12-18 NOTE — ED Notes (Signed)
Pt unable to give urine sample at this time 

## 2016-12-18 NOTE — ED Notes (Addendum)
Spoke with [employer] who lives and works at the [employer] where the patient also resides and works. [name] states patient has been unable to perform her ADL's for the past 3 days. Mr. Lasandra Beech states that patient is unable to get her meals independently, has not been able to perform hygiene. [name] states he found the patient outside this past Tuesday in a gown with no coat. Mr. Lasandra Beech states that the patient lives by herself and has no one to help her at this time. Dr. Leonette Monarch made aware of situation and that it is unsafe for patient to return back to her home at this time. Patient will board in ED until other arrangements can be made.

## 2016-12-18 NOTE — Progress Notes (Signed)
CSW spoke with patient regarding discharge. Patient currently lives at the [facility] with transitional housing. Patient states she has complications with walking which is baseline. CSW attempted to contact Cletis Brinson to find out if patient can receive [facility] services. CSW left voicemail for return call.   Kingsley Spittle, LCSWA Clinical Social Worker [phone]

## 2016-12-18 NOTE — ED Triage Notes (Signed)
Pt is a breast cancer pt with mets to brain. Pt case worker in the house where she resides sts that she has been altered for around 5 days and was found on the floor in her room. Today, pt has lost feeling in her legs and unable to walk. Pt sts that she is confused and cannot remember simple things. Pt is A&O x3. Pt was recently hospitalized for the same. Pt VSS and in NAD.

## 2016-12-18 NOTE — ED Notes (Signed)
Pt case manager with housing program where pt resides: [name] [phone] Wants to be called with updates and status for pt per pt request

## 2016-12-18 NOTE — ED Notes (Signed)
Pt ambulated to the restroom with staff watching.

## 2016-12-18 NOTE — ED Notes (Signed)
Bed: [id number] Expected date:  Expected time:  Means of arrival:  Comments: Hold Room 14

## 2016-12-18 NOTE — ED Notes (Signed)
Spoke with patient's sister in [place], [place] they will discuss plan of care for patient tomorrow with SW

## 2016-12-18 NOTE — ED Notes (Addendum)
Pt stays at a group home where her case worker is responsible for her care. Pt and I have tried multiple times to contact case worker w/o success. Pt  Is a breast cancer pt with mets to brain and is unstable on her feet. Pt is to remain in room until someone can be reached to pick her up per charge RN. Pt is aware and remains in room.

## 2016-12-18 NOTE — Discharge Instructions (Addendum)
It was our pleasure to provide your ER care today - we hope that you feel better.  Your lab work shows a possible urine infection.  Drink plenty of fluids.  Take antibiotic as prescribed.  Follow up with primary care doctor in the coming week for recheck.  We discussed your case with our social worker - we will make a home health referral to offer you additional support/services.   Return to ER if worse, new symptoms, fevers, trouble breathing, or other medical emergency.

## 2016-12-18 NOTE — ED Notes (Addendum)
Patient is wandering in hallway-unsteady on feet-patient is tearful-states "I just don't feel good"-complaining of nausea-denies any pain. VSS, Dr. Leonette Monarch aware and will re-evaluate patient

## 2016-12-18 NOTE — Telephone Encounter (Signed)
FYI "This is Cleatis Brinson calling to let you all know this patient is currently in [place] as suggested if anyone wants to see her there.  She is unable to stand or walk.  Memory goes back and forth, she shakes, is uncoordinated is why she's there at this time."

## 2016-12-18 NOTE — ED Provider Notes (Addendum)
[facility] DEPT Provider Note   CSN: [id number] Arrival date & time: 12/18/16  1022     History   Chief Complaint Chief Complaint  Patient presents with  . Altered Mental Status    HPI [name] is a 56 y.o. female.  Patient with hx metastatic breast ca, c/o feeling generally weak and shaky for the past week. States felt similar when was admitted to hospital earlier this month.  Patient indicates is taking her normal medication as prescribed. No recent chemotherapy or ca tx. Is eating/drinking. Denies fever or chills. Denies focal pain. No headaches. No chest pain or sob. No cough, sore throat or uri c/o. No abd pain. No vomiting or diarrhea. No dysuria.  Patient relatively poor historian - level 5 caveat.    The history is provided by the patient.  Altered Mental Status   Pertinent negatives include no weakness.    Past Medical History:  Diagnosis Date  . Alcohol abuse   . Anemia    during chemo  . Anxiety    At age 41  . Arthritis Dx 2010  . Bipolar disorder ([place])   . Cancer ([place])    breast mets to brain  . Chronic pain   . Complication of anesthesia   . Depression   . Fibromyalgia Dx 2005  . GERD (gastroesophageal reflux disease)   . Headache    hx  migraines  . Opiate dependence ([place])   . PONV (postoperative nausea and vomiting)   . Port-a-cath in place   . PTSD (post-traumatic stress disorder)     Patient Active Problem List   Diagnosis Date Noted  . Bipolar I disorder, most recent episode depressed ([place]) 12/08/2016  . Adjustment disorder with anxiety 12/06/2016  . Delirium due to another medical condition 12/03/2016  . Acute encephalopathy 12/03/2016  . Diarrhea 12/03/2016  . Metastatic breast cancer ([place])   . Cerumen impaction 11/25/2016  . Otitis media 11/06/2016  . Brain metastasis ([place]) 07/27/2016  . Iron deficiency anemia 06/26/2016  . Bone metastases ([place]) 06/03/2016  . Primary cancer of lower-inner quadrant of left female breast ([place])  06/01/2016  . Pap smear for cervical cancer screening 03/28/2015  . Current smoker 03/28/2015  . Healthcare maintenance 03/28/2015  . Seasonal allergies 03/28/2015  . Anxiety state 02/28/2015  . Fibromyalgia 02/28/2015  . Family history of diabetes mellitus 02/28/2015  . H/O alcohol abuse     Past Surgical History:  Procedure Laterality Date  . APPLICATION OF CRANIAL NAVIGATION N/A 08/14/2016   Procedure: APPLICATION OF CRANIAL NAVIGATION;  Surgeon: Erline Levine, MD;  Location: [facility];  Service: Neurosurgery;  Laterality: N/A;  . BREAST RECONSTRUCTION Left    with silicone implant  . CRANIOTOMY N/A 08/14/2016   Procedure: CRANIOTOMY TUMOR EXCISION WITH Lucky Rathke;  Surgeon: Erline Levine, MD;  Location: [facility];  Service: Neurosurgery;  Laterality: N/A;  . FIBULA FRACTURE SURGERY Left   . MASTECTOMY Left   . RADIOLOGY WITH ANESTHESIA N/A 07/23/2016   Procedure: MRI OF BRAIN WITH AND WITHOUT;  Surgeon: Medication Radiologist, MD;  Location: [facility];  Service: Radiology;  Laterality: N/A;  . RADIOLOGY WITH ANESTHESIA N/A 09/08/2016   Procedure: MRI OF BRAIN WITH AND WITHOUT CONTRAST;  Surgeon: Medication Radiologist, MD;  Location: [facility];  Service: Radiology;  Laterality: N/A;  . RADIOLOGY WITH ANESTHESIA N/A 12/10/2016   Procedure: MRI OF BRAIN WITH AND WITHOUT;  Surgeon: Medication Radiologist, MD;  Location: [facility];  Service: Radiology;  Laterality: N/A;  .  right power port placement Right     OB History    No data available       Home Medications    Prior to Admission medications   Medication Sig Start Date End Date Taking? Authorizing Provider  ALPRAZolam Duanne Moron) 1 MG tablet Take 1 tablet (1 mg total) by mouth as directed. Take xanax 1mg  tab oral 30 minutes before radaition treatments and bedtime 12/10/16   Ripudeep Krystal Eaton, MD  anastrozole (ARIMIDEX) 1 MG tablet Take 1 tablet (1 mg total) by mouth daily. 08/25/16   Chauncey Cruel, MD  Benzocaine-Antipyrine 55-14 MG/ML  SOLN 2-4 drops each ear three times a day. Wet cotton ball with drop and place in ear post instilling drops. 12/01/16   Chauncey Cruel, MD  BIOTIN PO Take 1 capsule by mouth daily.    Historical Provider, MD  cetirizine (ZYRTEC) 10 MG tablet Take 1 tablet (10 mg total) by mouth daily. 07/14/16   Chauncey Cruel, MD  cholestyramine Lucrezia Starch) 4 g packet Take 1 packet (4 g total) by mouth 3 (three) times daily with meals. 10/27/16   Chauncey Cruel, MD  dexamethasone (DECADRON) 2 MG tablet Take 1 tablet (2 mg total) by mouth daily. 11/24/16   Chauncey Cruel, MD  diphenoxylate-atropine (LOMOTIL) 2.5-0.025 MG tablet Take 2 tablets by mouth 4 (four) times daily as needed for diarrhea or loose stools. 10/27/16   Chauncey Cruel, MD  fluticasone (FLONASE) 50 MCG/ACT nasal spray Place 2 sprays into both nostrils daily. 10/27/16   Chauncey Cruel, MD  gabapentin (NEURONTIN) 300 MG capsule Take 2 capsules (600 mg total) by mouth 3 (three) times daily. 300 mg at night for one day, twice daily for one day, then three times daily 11/11/16   Chauncey Cruel, MD  ibuprofen (ADVIL,MOTRIN) 200 MG tablet Take 800 mg by mouth every 6 (six) hours as needed for mild pain.    Historical Provider, MD  lamoTRIgine (LAMICTAL) 100 MG tablet Take 1.5 tablets (150 mg total) by mouth 2 (two) times daily. 11/11/16   Chauncey Cruel, MD  lidocaine-prilocaine (EMLA) cream Apply 1 application topically as needed. Patient taking differently: Apply 1 application topically as needed (port access).  06/19/16   Chauncey Cruel, MD  lithium carbonate 300 MG capsule TAKE 1 CAPSULE BY MOUTH TWICE DAILY WITH A MEAL Patient taking differently: TAKE 300 MG BY MOUTH TWICE DAILY WITH A MEAL 11/16/16   Chauncey Cruel, MD  Loperamide HCl (IMODIUM PO) Take 1 tablet by mouth as needed (diarrhea).     Historical Provider, MD  MUCINEX D 60-600 MG 12 hr tablet TAKE 1 TABLET BY MOUTH 2 TIMES DAILY FOR 1 TO 2 WEEKS AS NEEDED FOR  CONGESTION. 11/18/16   Chauncey Cruel, MD  MULTIPLE VITAMIN PO Take by mouth every morning.    Historical Provider, MD  ondansetron (ZOFRAN) 8 MG tablet Take 1 tablet (8 mg total) by mouth every 8 (eight) hours as needed for nausea or vomiting. 12/10/16   Ripudeep Krystal Eaton, MD  pantoprazole (PROTONIX) 40 MG tablet TAKE 1 TABLET BY MOUTH ONCE DAILY Patient taking differently: TAKE 40 MG BY MOUTH DAILY 10/28/16   Chauncey Cruel, MD  pantoprazole (PROTONIX) 40 MG tablet TAKE 1 TABLET EVERY DAY 12/14/16   Chauncey Cruel, MD  potassium chloride (K-DUR) 10 MEQ tablet Take 1 tablet (10 mEq total) by mouth daily. Patient not taking: Reported on 12/04/2016 07/07/16   Chauncey Cruel,  MD  potassium chloride (K-DUR,KLOR-CON) 10 MEQ tablet Take 10 mEq by mouth daily. 10/14/16   Historical Provider, MD  tiZANidine (ZANAFLEX) 4 MG tablet Take 1 tablet (4 mg total) by mouth daily. 06/12/16   Kyung Rudd, MD  traMADol (ULTRAM) 50 MG tablet Take 1 tablet (50 mg total) by mouth every 6 (six) hours as needed. Patient taking differently: Take 50 mg by mouth every 6 (six) hours as needed for moderate pain or severe pain.  11/06/16   Susanne Borders, NP  traZODone (DESYREL) 150 MG tablet TAKE 2 TABLETS BY MOUTH AT BEDTIME. Patient taking differently: TAKE 300 MG BY MOUTH AT BEDTIME 11/16/16   Chauncey Cruel, MD  varenicline (CHANTIX) 0.5 MG tablet Take 1 tablet (0.5 mg total) by mouth daily. 11/11/16   Chauncey Cruel, MD  Vitamin D, Cholecalciferol, 1000 units CAPS Take 1,000 Units by mouth daily.    Historical Provider, MD  Wound Dressings (SONAFINE) Apply 1 application topically daily. Apply to head after rad txs whern skin becomes itchy or irritated,    Hayden Pedro, PA-C    Family History Family History  Problem Relation Age of Onset  . Diabetes Mother   . Bipolar disorder Mother   . CAD Father     Social History Social History  Substance Use Topics  . Smoking status: Current Every Day  Smoker    Packs/day: 0.50    Types: Cigarettes  . Smokeless tobacco: Never Used     Comment: Pt is on Chantix at present time  . Alcohol use No     Comment: no ETOH since 08/22/12     Allergies   Demerol and Erythromycin   Review of Systems Review of Systems  Constitutional: Negative for chills and fever.  HENT: Negative for sore throat.   Eyes: Negative for visual disturbance.  Respiratory: Negative for cough and shortness of breath.   Cardiovascular: Negative for chest pain.  Gastrointestinal: Negative for abdominal pain, diarrhea and vomiting.  Genitourinary: Negative for dysuria and flank pain.  Musculoskeletal: Negative for back pain and neck pain.  Skin: Negative for rash.  Neurological: Negative for weakness, numbness and headaches.  Hematological: Does not bruise/bleed easily.  Psychiatric/Behavioral: The patient is nervous/anxious.      Physical Exam Updated Vital Signs BP 128/73 (BP Location: Right Arm)   Pulse 107   Temp 98.3 F (36.8 C) (Oral)   Resp 14   Ht 5\' 5"  (1.651 m)   Wt 67.6 kg   SpO2 95%   BMI 24.79 kg/m   Physical Exam  Constitutional: She appears well-developed and well-nourished. No distress.  HENT:  Head: Atraumatic.  Eyes: Conjunctivae are normal. Pupils are equal, round, and reactive to light. No scleral icterus.  Neck: Neck supple. No tracheal deviation present.  No stiffness or rigidity  Cardiovascular: Normal rate, regular rhythm, normal heart sounds and intact distal pulses.   No murmur heard. Pulmonary/Chest: Effort normal and breath sounds normal. No respiratory distress.  Abdominal: Soft. Normal appearance and bowel sounds are normal. She exhibits no distension. There is no tenderness.  Genitourinary:  Genitourinary Comments: No cva tenderness  Musculoskeletal: She exhibits no edema.  Neurological: She is alert.  Speech clear/fluent. Motor intact bil, stre 5/5. sens grossly intact.   Skin: Skin is warm and dry. No rash  noted. She is not diaphoretic.  Psychiatric:  Mildly anxious appearing.   Nursing note and vitals reviewed.    ED Treatments / Results  Labs (all labs  ordered are listed, but only abnormal results are displayed) Results for orders placed or performed during the hospital encounter of 12/18/16  Comprehensive metabolic panel  Result Value Ref Range   Sodium 139 135 - 145 mmol/L   Potassium 3.5 3.5 - 5.1 mmol/L   Chloride 103 101 - 111 mmol/L   CO2 25 22 - 32 mmol/L   Glucose, Bld 116 (H) 65 - 99 mg/dL   BUN 6 6 - 20 mg/dL   Creatinine, Ser 0.98 0.44 - 1.00 mg/dL   Calcium 9.7 8.9 - 10.3 mg/dL   Total Protein 6.8 6.5 - 8.1 g/dL   Albumin 4.5 3.5 - 5.0 g/dL   AST 26 15 - 41 U/L   ALT 28 14 - 54 U/L   Alkaline Phosphatase 48 38 - 126 U/L   Total Bilirubin 0.4 0.3 - 1.2 mg/dL   GFR calc non Af Amer >60 >60 mL/min   GFR calc Af Amer >60 >60 mL/min   Anion gap 11 5 - 15  CBC  Result Value Ref Range   WBC 6.5 4.0 - 10.5 K/uL   RBC 4.61 3.87 - 5.11 MIL/uL   Hemoglobin 15.1 (H) 12.0 - 15.0 g/dL   HCT 45.2 36.0 - 46.0 %   MCV 98.0 78.0 - 100.0 fL   MCH 32.8 26.0 - 34.0 pg   MCHC 33.4 30.0 - 36.0 g/dL   RDW 12.8 11.5 - 15.5 %   Platelets 216 150 - 400 K/uL  Urinalysis, Routine w reflex microscopic  Result Value Ref Range   Color, Urine YELLOW YELLOW   APPearance CLEAR CLEAR   Specific Gravity, Urine 1.016 1.005 - 1.030   pH 6.0 5.0 - 8.0   Glucose, UA NEGATIVE NEGATIVE mg/dL   Hgb urine dipstick SMALL (A) NEGATIVE   Bilirubin Urine NEGATIVE NEGATIVE   Ketones, ur 80 (A) NEGATIVE mg/dL   Protein, ur NEGATIVE NEGATIVE mg/dL   Nitrite NEGATIVE NEGATIVE   Leukocytes, UA SMALL (A) NEGATIVE   RBC / HPF 0-5 0 - 5 RBC/hpf   WBC, UA 6-30 0 - 5 WBC/hpf   Bacteria, UA RARE (A) NONE SEEN   Squamous Epithelial / LPF NONE SEEN NONE SEEN   Mucous PRESENT    Hyaline Casts, UA PRESENT   Rapid urine drug screen (hospital performed)  Result Value Ref Range   Opiates NONE DETECTED NONE  DETECTED   Cocaine NONE DETECTED NONE DETECTED   Benzodiazepines POSITIVE (A) NONE DETECTED   Amphetamines NONE DETECTED NONE DETECTED   Tetrahydrocannabinol NONE DETECTED NONE DETECTED   Barbiturates NONE DETECTED NONE DETECTED  Lithium level  Result Value Ref Range   Lithium Lvl 0.80 0.60 - 1.20 mmol/L  CBG monitoring, ED  Result Value Ref Range   Glucose-Capillary 100 (H) 65 - 99 mg/dL   Ct Head Wo Contrast  Result Date: 12/03/2016 CLINICAL DATA:  Increased altered mental status, fell 3 days ago. History of breast cancer with metastasis to the brain. EXAM: CT HEAD WITHOUT CONTRAST TECHNIQUE: Contiguous axial images were obtained from the base of the skull through the vertex without intravenous contrast. COMPARISON:  MRI from 09/08/2016.  Sore FINDINGS: Brain: No evidence of acute infarction, hemorrhage, hydrocephalus, nor extra-axial collection. Suboccipital postoperative change involving the left skull base. Tiny focus of hypodensity in the left cerebellar hemisphere consistent with a tiny focus of postoperative encephalomalacia. No new intra-axial mass like abnormality identified. Vascular: Nonacute Skull: Postop change left skullbase. Sinuses/Orbits: Nonacute. Other: None IMPRESSION: Left suboccipital postoperative change  with tiny focus of encephalomalacia at the site of prior cerebellar metastasis. No acute intracranial abnormality identified. Electronically Signed   By: Ashley Royalty M.D.   On: 12/03/2016 15:32   Mr Jeri Cos [id number] Contrast  Result Date: 12/10/2016 CLINICAL DATA:  56 year old female with metastatic breast cancer. Status post resection of left cerebellar metastasis in September. Recent Altered mental status. Subsequent encounter. EXAM: MRI HEAD WITHOUT AND WITH CONTRAST TECHNIQUE: Multiplanar, multiecho pulse sequences of the brain and surrounding structures were obtained without and with intravenous contrast. CONTRAST:  45mL MULTIHANCE GADOBENATE DIMEGLUMINE 529 MG/ML IV SOLN  COMPARISON:  Head CT without contrast 12/03/2016. Post treatment brain MRI 09/08/2016 and earlier. FINDINGS: Brain: No restricted diffusion or evidence of acute infarction. No ventriculomegaly. No acute intracranial hemorrhage identified. Sequelae of left suboccipital craniectomy. The residual stellate enhancement described in October at the left cerebellar resection site has regressed (series 10, image 30 today versus series 10, image 18 previously. Mild overlying dural thickening appears stable and is likely postoperative. No strong evidence of leptomeningeal metastatic disease today. No cerebellar edema. No posterior fossa mass effect. No new or abnormal intracranial enhancement elsewhere. Stable gray and white matter signal throughout the brain. No encephalomalacia. No chronic cerebral blood products. Negative pituitary and cervicomedullary junction. Vascular: Major intracranial vascular flow voids are stable and within normal limits. Skull and upper cervical spine: Negative visualized cervical spinal cord. Visible bone marrow signal is stable and within normal limits. Sinuses/Orbits: Visualized paranasal sinuses and mastoids are stable and well pneumatized. Orbits soft tissues appear stable and normal. Other: Visible internal auditory structures appear normal. No acute scalp soft tissue findings. IMPRESSION: 1. Satisfactory post treatment appearance with regressed enhancement at the left cerebellar resection site since October. No residual or recurrent metastatic tumor suspected. No evidence of leptomeningeal disease. No new metastatic disease to the brain. 2.  No acute intracranial abnormality. Electronically Signed   By: Genevie Ann M.D.   On: 12/10/2016 10:47    EKG  EKG Interpretation None       Radiology No results found.  Procedures Procedures (including critical care time)  Medications Ordered in ED Medications - No data to display   Initial Impression / Assessment and Plan / ED Course    I have reviewed the triage vital signs and the nursing notes.  Pertinent labs & imaging results that were available during my care of the patient were reviewed by me and considered in my medical decision making (see chart for details).  Reviewed nursing notes and prior charts for additional history.   Patient with recent mri negative for acute process or new metastatic disease.   Iv ns bolus. Po fluids.  Recheck pt - pt ambulatory about ED with steady gait.  Patient interacting w staff, asking for food/drink.  Is tolerating po well.    Asking for additional soda.  Ambulating about ED, cooperative, conversant.   Patients mental status appears c/w baseline. No acute psychosis. Denies depression.   Discussed w social work - they recommend making home health referral to patients current facility - they will contact pts facility Freight forward[employer].   Pt with possible uti on labs w LE pos, 6-30 wbc, few bact. Will rx   Patient currently appears stable for d/c.      Final Clinical Impressions(s) / ED Diagnoses   Final diagnoses:  None    New Prescriptions New Prescriptions   No medications on file     Lajean Saver, MD 12/18/16 [phone]  Lajean Saver, MD 12/18/16 [phone]

## 2016-12-19 NOTE — ED Notes (Signed)
Patient seen by case management. Te[place] MD notified, patient ok to discharge.

## 2016-12-19 NOTE — Progress Notes (Signed)
CSW spoke with patient at bedside, staff from patient's residence (Recovery house with transitional housing) present ([facility] [phone]). Patient reports that she is ready to return to residence and does not have any concerns about ADL's and or any safety concerns. Staff reported that he has concerns about patient returning to residence because it is independent living and patient requires assistance completing ADLs sometimes and patient sometimes experiences mobility issues noting that patient has been found on the floor on multiple times. CSW inquired about patient's desire to find a residence with a higher level of care for safety. Patient reported that she is not going to assisted living and wants to return to residence. CSW inquired about patient interest in [facility] services coming to residence for patient to provide assistance. Patient reported that she doesn't have any other choice. Staff reported that patient can have someone come into the residence to provide assistance. CSW contacted CM in regards to patient's needs to follow up with patient in regards to discharge.

## 2016-12-19 NOTE — Care Management Note (Addendum)
Case Management Note  Patient Details  Name: [name] MRN: [id number] Date of Birth: 1961/06/08  Subjective/Objective:   Weakness                 Action/Plan:  Discharge Planning: AVS reviewed:  NCM spoke to pt and [employer], [facility] # [phone], fax [phone] at bedside. [name] states their facility does not support [facility] RN. States they will allow pt to have a aide or personal care assistant. Explained her PCP can arrange for [place] in the home. Pt was seen in [facility] in past. Provided [facility] brochure with contact number. Pt has Medicaid and PCP is assigned. She can contact [place] on her Medicaid card. Explained she will need to contact PCP to arrange follow up appt. Pt states she has been following up with the Oncologist.   12/21/2016 [place] Faxed [place] Medicaid PCS application to Director of Transitional House to have pt's PCP complete for a personal care assistant at [place].    Expected Discharge Date:  12/19/2016             Expected Discharge Plan:  [place]  In-House Referral:  Clinical Social Work  Discharge planning Services  CM Consult  Post Acute Care Choice:  NA Choice offered to:  Patient, [facility] POA / Guardian  DME Arranged:  N/A DME Agency:  NA  HH Arranged:  NA HH Agency:  NA  Status of Service:  Completed, signed off  If discussed at [place] of Stay Meetings, dates discussed:    Additional Comments:  Erenest Rasher, RN 12/19/2016, 12:36 PM

## 2016-12-21 ENCOUNTER — Telehealth: Payer: Self-pay | Admitting: *Deleted

## 2016-12-23 ENCOUNTER — Other Ambulatory Visit: Payer: Self-pay | Admitting: Oncology

## 2016-12-23 DIAGNOSIS — C50919 Malignant neoplasm of unspecified site of unspecified female breast: Secondary | ICD-10-CM

## 2016-12-24 ENCOUNTER — Encounter: Payer: Self-pay | Admitting: Nurse Practitioner

## 2016-12-24 ENCOUNTER — Other Ambulatory Visit: Payer: Self-pay | Admitting: *Deleted

## 2016-12-24 ENCOUNTER — Encounter: Payer: Self-pay | Admitting: *Deleted

## 2016-12-24 ENCOUNTER — Encounter: Payer: Self-pay | Admitting: General Practice

## 2016-12-24 ENCOUNTER — Ambulatory Visit (HOSPITAL_BASED_OUTPATIENT_CLINIC_OR_DEPARTMENT_OTHER): Payer: Medicaid Other | Admitting: Oncology

## 2016-12-24 ENCOUNTER — Ambulatory Visit (HOSPITAL_BASED_OUTPATIENT_CLINIC_OR_DEPARTMENT_OTHER): Payer: Medicaid Other | Admitting: Nurse Practitioner

## 2016-12-24 VITALS — BP 124/78 | HR 114 | Temp 97.9°F | Resp 18 | Ht 65.0 in | Wt 147.5 lb

## 2016-12-24 DIAGNOSIS — Z170 Estrogen receptor positive status [ER+]: Secondary | ICD-10-CM | POA: Diagnosis not present

## 2016-12-24 DIAGNOSIS — Z79811 Long term (current) use of aromatase inhibitors: Secondary | ICD-10-CM

## 2016-12-24 DIAGNOSIS — C50919 Malignant neoplasm of unspecified site of unspecified female breast: Secondary | ICD-10-CM

## 2016-12-24 DIAGNOSIS — R112 Nausea with vomiting, unspecified: Secondary | ICD-10-CM

## 2016-12-24 DIAGNOSIS — C773 Secondary and unspecified malignant neoplasm of axilla and upper limb lymph nodes: Secondary | ICD-10-CM | POA: Diagnosis not present

## 2016-12-24 DIAGNOSIS — C50312 Malignant neoplasm of lower-inner quadrant of left female breast: Secondary | ICD-10-CM

## 2016-12-24 DIAGNOSIS — E860 Dehydration: Secondary | ICD-10-CM

## 2016-12-24 DIAGNOSIS — C7931 Secondary malignant neoplasm of brain: Secondary | ICD-10-CM

## 2016-12-24 DIAGNOSIS — F05 Delirium due to known physiological condition: Secondary | ICD-10-CM

## 2016-12-24 DIAGNOSIS — C7951 Secondary malignant neoplasm of bone: Secondary | ICD-10-CM

## 2016-12-24 DIAGNOSIS — R110 Nausea: Secondary | ICD-10-CM

## 2016-12-24 DIAGNOSIS — Z95828 Presence of other vascular implants and grafts: Secondary | ICD-10-CM

## 2016-12-24 MED ORDER — ALPRAZOLAM 1 MG PO TABS: 1.0000 mg | ORAL_TABLET | Freq: Every day | ORAL | 0 refills | Status: DC

## 2016-12-24 MED ORDER — SODIUM CHLORIDE 0.9% FLUSH
10.0000 mL | INTRAVENOUS | Status: DC | PRN
  Administered 2016-12-24: 10 mL via INTRAVENOUS
  Filled 2016-12-24: qty 10

## 2016-12-24 MED ORDER — LIDOCAINE-PRILOCAINE 2.5-2.5 % EX CREA: 1.0000 "application " | TOPICAL_CREAM | CUTANEOUS | 0 refills | Status: DC | PRN

## 2016-12-24 MED ORDER — LITHIUM CARBONATE 300 MG PO CAPS: ORAL_CAPSULE | ORAL | 0 refills | Status: DC

## 2016-12-24 MED ORDER — ONDANSETRON HCL 4 MG/2ML IJ SOLN
INTRAMUSCULAR | Status: AC
  Filled 2016-12-24: qty 4

## 2016-12-24 MED ORDER — SODIUM CHLORIDE 0.9 % IV SOLN: Freq: Once | INTRAVENOUS | Status: DC

## 2016-12-24 MED ORDER — ONDANSETRON HCL 4 MG/2ML IJ SOLN
8.0000 mg | Freq: Once | INTRAMUSCULAR | Status: DC
  Administered 2016-12-24: 8 mg via INTRAVENOUS

## 2016-12-24 MED ORDER — SODIUM CHLORIDE 0.9 % IV SOLN
INTRAVENOUS | Status: DC
  Administered 2016-12-24: 13:00:00 via INTRAVENOUS

## 2016-12-24 MED ORDER — HEPARIN SOD (PORK) LOCK FLUSH 100 UNIT/ML IV SOLN
500.0000 [IU] | Freq: Once | INTRAVENOUS | Status: AC
  Administered 2016-12-24: 500 [IU] via INTRAVENOUS
  Filled 2016-12-24: qty 5

## 2016-12-24 MED ORDER — TRAMADOL HCL 50 MG PO TABS: 50.0000 mg | ORAL_TABLET | Freq: Four times a day (QID) | ORAL | 0 refills | Status: DC | PRN

## 2016-12-24 NOTE — Addendum Note (Signed)
Addended by: Henreitta Leber E on: 12/24/2016 12:37 PM   Modules accepted: Orders

## 2016-12-24 NOTE — Progress Notes (Signed)
[place] CLINICAL SOCIAL WORK PSYCHOSOCIAL ASSESSMENT   Date:  12/24/16   First Name: Shannon Obryant.:       Last Name: [name] MRN:  [id number]  The patient was referred by MD to assess for psychosocial, emotional, spiritual, and practical needs.    Primary Cancer Type: Cancer Staging No matching staging information was found for the patient.        Marital Status: Single  Practical Problems: yes  assistance needed with ADLs   Employment: @[employer]EMPSTATUS @  unemployed, has not filed for disability   Source of Income: No income, supported by friend  Physicist, medical of $197 a month   Insurance: MCD   Family Problems: yes  yes Concerns caring for family needs: no    Emotional Problems: yes  Concerns of Adjustment to Diagnosis/Treatment: yes   Current Symptoms of Anxiety: yes   Current Symptoms of Depression: yes Pt has bipolar disorder, struggles with depression and anxiety.  Safety/Risk Concerns: yes   Mental Status Exam Orientation: person, place, and time Affect: anxious Thought: goal directed      Spiritual/Religious: None identified  Living Situation: group home, but not actual resident, was paying rent and also "worked at the home".   Functional Status: Assistance Required, Housekeeping, ADLs, Medication Management, Supervision and Transportation                                                                                               Strengths and Barriers To Treatment:   Patient Coping Strengths:   Self Advocate                                                                                                  Identified Problems/Needs and Barriers to Care:  Illness-related problems, Housing, Haematologist, Engineer, site, ADLs Assistance, Adjustment to Illness, Emotional/Behavioral/Cognitive, Forensic scientist, Coping/Communication, Family and social conflict/isolation, Caregiver issues, Treatment decisions, quality of life and  Employment/School/Career Concerns    Counseling and Social Work Interventions and Recommendations:   Brief Counseling/Psychotherapy, Data processing manager, Pension scheme manager, Advocacy/Education, Referral to community mental health provider, Referral to Dietician and Referral to Psychiatrist   Impressions/Plan: Pt has very limited support systems in place locally. She resides in a group recovery house, but is not currently in treatment or receiving assistance through the home. The manager of the home, Clet[facility] appears to be allowing her to live in the home and drives her to appointments. Pt shared she has had issues with short term memory, recent falls due to feeling weak and no energy to do her ADLs. Pt has medicaid, but has never applied for ss disability. Based on her Dx, she qualifies and pt agrees to a referral to the [employer] [facility] for assistance with this process. Pt has no current income at all, other than her food stamps. She was approved for the [employer] and now has $34.19 left in her account per financial advocate. Pt has been in desperate need of mental health follow up as pt has struggled to obtain needed follow up. Pt was provided with referral to [facility] IOP for counseling, but has not followed up to date. Pt appears to need case management assistance as well due to memory issues and her bipolar not being managed. CSW to refer pt to [facility] to assist with medications, etc. CSW has left additional messages for [employer], home manager.   Pt needs help with transportation and could get [facility] services to assist her. CSW to complete with pt next week. CSW also feels referral to [facility] could be helpful for additional support in the home. Pt could benefit from completing ADRs, HCPOA as these have not been completed to date. Pt needs MH ADR as well. CSW to work on referrals, hopeful for return call from [place]. CSW to follow closely.   Loren Racer, [place] Worker [place]  [place] Phone: [phone] Fax: [phone]

## 2016-12-24 NOTE — Progress Notes (Signed)
[facility] Note  Met with [name] in [facility] waiting area prior to MD appt.  Her whole affect brightened at the connection  Due to lack of transportation [name] is isolated from social contact and opportunities for meaning-making. Additionally, she reports "just feeling really bad" in the last few weeks (physical shakes, exhaustion, pain, etc), an experience that she hasn't been able to reconcile with an explanation.  This unclarity/uncertainty appears to contribute to emotional/mental stress.  Plan to f/u by phone next week per pt request.  She is reviewing [facility] programming schedule for possible activities that may be of interest/support to her.  Please also page as needs arise to facilitate multidisciplinary support of pt.  Thank you.   [place], [place], [facility] Pager [phone] Voicemail [phone]

## 2016-12-24 NOTE — Progress Notes (Signed)
Pt rested well during IV fluids. Pt consumed 3 cups of tomato soup and 1 can of diet coke while here. Denies nausea after receiving IV zofran. Discharged home after fluids completed. Pt states she is feeling better.

## 2016-12-24 NOTE — Patient Instructions (Signed)
Dehydration, Adult Dehydration is a condition in which there is not enough fluid or water in the body. This happens when you lose more fluids than you take in. Important organs, such as the kidneys, brain, and heart, cannot function without a proper amount of fluids. Any loss of fluids from the body can lead to dehydration. Dehydration can range from mild to severe. This condition should be treated right away to prevent it from becoming severe. What are the causes? This condition may be caused by:  Vomiting.  Diarrhea.  Excessive sweating, such as from heat exposure or exercise.  Not drinking enough fluid, especially:  When ill.  While doing activity that requires a lot of energy.  Excessive urination.  Fever.  Infection.  Certain medicines, such as medicines that cause the body to lose excess fluid (diuretics).  Inability to access safe drinking water.  Reduced physical ability to get adequate water and food. What increases the risk? This condition is more likely to develop in people:  Who have a poorly controlled long-term (chronic) illness, such as diabetes, heart disease, or kidney disease.  Who are age 65 or older.  Who are disabled.  Who live in a place with high altitude.  Who play endurance sports. What are the signs or symptoms? Symptoms of mild dehydration may include:   Thirst.  Dry lips.  Slightly dry mouth.  Dry, warm skin.  Dizziness. Symptoms of moderate dehydration may include:   Very dry mouth.  Muscle cramps.  Dark urine. Urine may be the color of tea.  Decreased urine production.  Decreased tear production.  Heartbeat that is irregular or faster than normal (palpitations).  Headache.  Light-headedness, especially when you stand up from a sitting position.  Fainting (syncope). Symptoms of severe dehydration may include:   Changes in skin, such as:  Cold and clammy skin.  Blotchy (mottled) or pale skin.  Skin that does  not quickly return to normal after being lightly pinched and released (poor skin turgor).  Changes in body fluids, such as:  Extreme thirst.  No tear production.  Inability to sweat when body temperature is high, such as in hot weather.  Very little urine production.  Changes in vital signs, such as:  Weak pulse.  Pulse that is more than 100 beats a minute when sitting still.  Rapid breathing.  Low blood pressure.  Other changes, such as:  Sunken eyes.  Cold hands and feet.  Confusion.  Lack of energy (lethargy).  Difficulty waking up from sleep.  Short-term weight loss.  Unconsciousness. How is this diagnosed? This condition is diagnosed based on your symptoms and a physical exam. Blood and urine tests may be done to help confirm the diagnosis. How is this treated? Treatment for this condition depends on the severity. Mild or moderate dehydration can often be treated at home. Treatment should be started right away. Do not wait until dehydration becomes severe. Severe dehydration is an emergency and it needs to be treated in a hospital. Treatment for mild dehydration may include:   Drinking more fluids.  Replacing salts and minerals in your blood (electrolytes) that you may have lost. Treatment for moderate dehydration may include:   Drinking an oral rehydration solution (ORS). This is a drink that helps you replace fluids and electrolytes (rehydrate). It can be found at pharmacies and retail stores. Treatment for severe dehydration may include:   Receiving fluids through an IV tube.  Receiving an electrolyte solution through a feeding tube that is   passed through your nose and into your stomach (nasogastric tube, or NG tube).  Correcting any abnormalities in electrolytes.  Treating the underlying cause of dehydration. Follow these instructions at home:  If directed by your health care provider, drink an ORS:  Make an ORS by following instructions on the  package.  Start by drinking small amounts, about  cup (120 mL) every 5-10 minutes.  Slowly increase how much you drink until you have taken the amount recommended by your health care provider.  Drink enough clear fluid to keep your urine clear or pale yellow. If you were told to drink an ORS, finish the ORS first, then start slowly drinking other clear fluids. Drink fluids such as:  Water. Do not drink only water. Doing that can lead to having too little salt (sodium) in the body (hyponatremia).  Ice chips.  Fruit juice that you have added water to (diluted fruit juice).  Low-calorie sports drinks.  Avoid:  Alcohol.  Drinks that contain a lot of sugar. These include high-calorie sports drinks, fruit juice that is not diluted, and soda.  Caffeine.  Foods that are greasy or contain a lot of fat or sugar.  Take over-the-counter and prescription medicines only as told by your health care provider.  Do not take sodium tablets. This can lead to having too much sodium in the body (hypernatremia).  Eat foods that contain a healthy balance of electrolytes, such as bananas, oranges, potatoes, tomatoes, and spinach.  Keep all follow-up visits as told by your health care provider. This is important. Contact a health care provider if:  You have abdominal pain that:  Gets worse.  Stays in one area (localizes).  You have a rash.  You have a stiff neck.  You are more irritable than usual.  You are sleepier or more difficult to wake up than usual.  You feel weak or dizzy.  You feel very thirsty.  You have urinated only a small amount of very dark urine over 6-8 hours. Get help right away if:  You have symptoms of severe dehydration.  You cannot drink fluids without vomiting.  Your symptoms get worse with treatment.  You have a fever.  You have a severe headache.  You have vomiting or diarrhea that:  Gets worse.  Does not go away.  You have blood or green matter  (bile) in your vomit.  You have blood in your stool. This may cause stool to look black and tarry.  You have not urinated in 6-8 hours.  You faint.  Your heart rate while sitting still is over 100 beats a minute.  You have trouble breathing. This information is not intended to replace advice given to you by your health care provider. Make sure you discuss any questions you have with your health care provider. Document Released: 11/16/2005 Document Revised: 06/12/2016 Document Reviewed: 01/10/2016 [employer] Interactive Patient Education  2017 [employer]  

## 2016-12-24 NOTE — Progress Notes (Signed)
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 She tolerates her treatments well, and she has had no cardiac issues related to the anti-HER-2 treatment. Similarly with the anastrozole.[name]   Hot flashes and vaginal dryness are not a major issue. She never developed the arthralgias or myalgias that many patients can experience on this medication. She obtains it at a good price.  Despite this the intervening history has been very difficult for her. She has been seen in the emergency room at behavioral health repeatedly because of erratic behavior of confusion and encephalopathy. It is not clear what the cause of this may have been. She tells that she has been compliant with her psychiatric medications. At this point though she feels that though it is "very difficult and very stressful" she "can continue to get treated since obviously this is doing me some good."  REVIEW OF SYSTEMS: [name] sleeps poorly and is very tired. Her appetite is poor. She has had nausea and vomiting problems although these are now better. Anxiety and depression is the main issue. She tells me she is very shaky and overall "feels horrible". She feels where she is living now is safe and she can deal with it. Pain is controlled on tramadol. She has not yet had a meeting with a counselor at community wellness but "it is being arranged. A detailed review of systems  today was otherwise stable  BREAST CANCER HISTORY: From the original intake note:  [name] was aware of a "lemon sized lump in" her left axilla for about a year before bringing it to medical attention. By then she had developed left breast and left axillary swelling (June 2016). She presented to the local emergency room and had a chest CT scan 06/06/2015 which showed a nodule in the left breast measuring 0.9 cm and questionable left axillary adenopathy. She then proceeded to bilateral diagnostic mammography and left breast ultrasonography 06/19/2015. There were no prior films for comparison (last mammography 12 years prior).. The breast density was category C. Mammography showed in the left breast upper inner quadrant a 7 cm area including a small mass and significant pleomorphic calcifications. Ultrasonography defined the mass as measuring 1.2 cm. The left axilla appeared unremarkable. There was significant skin edema.  Biopsy of the left breast mass 06/19/2015 showed (SP [id number] [id number]) an invasive ductal carcinoma, grade 2, estrogen receptor 83% positive, progesterone receptor 26% positive, and HER-2 amplified by immunohistochemstry with a 3+ reading. The patient had biopsies of a separate area in the left breast August of the same year and this showed atypical ductal hyperplasia. (SP [id number]).  Accordingly after appropriate discussion on 08/21/2015 the patient proceeded to left mastectomy with left axillary sentinel lymph node sampling, which, since the lymph nodes were positive, extended to the procedure to left axillary lymph node dissection. The pathology (SP [id number]) showed an invasive ductal carcinoma, grade 3, measuring in excess of 9 cm. There were also skin satellites, not contiguous with the invasive carcinoma. Margins were clear and ample.  There was evidence of lymphovascular invasion. A total of 15 lymph nodes were removed, including 5 sentinel lymph nodes, all of which were positive, so that the  final total was 14 out of 15 lymph nodes involved by tumor. There was evidence of extranodal extension. The final pathology was pT4b pN2a, stage IIIB  CA-27-29 and CEA 09/19/2015 were non-informative October 2016.  Unfortunately CT scans of the chest abdomen and pelvis 09/16/2015 showed bony metastases to the right scapula, left iliac crest, and also L4 and T-spine. There were questionable liver cysts which on repeat CT scan 03/02/2016 appear to be a little bit more well-defined, possibly a little larger. There were also some possible right upper lobe lung lesions.  Adjuvant treatment consisted of docetaxel, trastuzumab and pertuzumab, with the final (6th) docetaxel dose given 02/11/2016. She continues on trastuzumab and pertuzumab, with the 11th cycle given 05/05/2016. Echocardiogram 02/26/2016 showed an ejection fraction of 55%. She receives denosumab/Xgeva every 6 weeks.. She also receives radiation, started 06/09/201, to be completed 06/26/2016.  Her subsequent history is as detailed below   PAST MEDICAL HISTORY: Past Medical History:  Diagnosis Date  . Alcohol abuse   . Anemia    during chemo  . Anxiety    At age 77  . Arthritis Dx 2010  . Bipolar disorder ([place])   . Cancer (HCC)    breast  . Chronic pain   . Depression   . Fibromyalgia Dx 2005  . GERD (gastroesophageal reflux disease)   . Headache    hx  migraines  . Opiate dependence ([place])   . Port-a-cath in place   . PTSD (post-traumatic stress disorder)     PAST SURGICAL HISTORY: Past Surgical History:  Procedure Laterality Date  . APPLICATION OF CRANIAL NAVIGATION N/A 08/14/2016   Procedure: APPLICATION OF CRANIAL NAVIGATION;  Surgeon: Erline Levine, MD;  Location: [facility];  Service: Neurosurgery;  Laterality: N/A;  . BREAST RECONSTRUCTION Left    with silicone implant  . CRANIOTOMY N/A 08/14/2016   Procedure: CRANIOTOMY TUMOR EXCISION WITH Lucky Rathke;  Surgeon: Erline Levine, MD;  Location: [facility];  Service:  Neurosurgery;  Laterality: N/A;  . FIBULA FRACTURE SURGERY    . MASTECTOMY Left   . RADIOLOGY WITH ANESTHESIA N/A 07/23/2016   Procedure: MRI OF BRAIN WITH AND WITHOUT;  Surgeon: Medication Radiologist, MD;  Location: [facility];  Service: Radiology;  Laterality: N/A;  . right power port placement Right     FAMILY HISTORY Family History  Problem Relation Age of Onset  . Diabetes Mother   . Bipolar disorder Mother   . CAD Father   The patient's father still living, age 49, in [place]. He had prostate cancer at some point in the past. The patient's mother died at age 46 from complications of diabetes. The patient had no brothers, 2 sisters. A paternal grandmother had lung cancer in the setting of tobacco abuse. There is no other history of cancer in the family to her knowledge  GYNECOLOGIC HISTORY:  No LMP recorded. Patient has had an ablation. Menarche approximately age 65. First live birth in 68. The patient is GX P2. She underwent endometrial ablation in 2016.  SOCIAL HISTORY:  The patient is originally from [place]. She has lived in [place] before but more recently was in [place]. She is back here because she could not afford her rent in [place]. She is living here and a temporary situation. She is divorced. HER-2 children are [name] who lives in [place] and works  as a Development worker, community, and [name] who also lives in [place] and works as a Catering manager. The patient has a grandchild, Arelia Longest, 70 years old as of July 2017, living in [place] with his mother. The patient has not established herself with a local church yet.    ADVANCED DIRECTIVES: Not in place; at the 06/03/2016 visit the patient was given the appropriate forms to complete and notarize at her discretion   HEALTH MAINTENANCE: Social History  Substance Use Topics  . Smoking status: Current Every Day Smoker    Packs/day: 0.50    Types: Cigarettes  . Smokeless tobacco: Never Used     Comment: Pt is on Chantix  at present time  . Alcohol use Yes     Comment: no ETOH since 08/22/12     Colonoscopy:  PAP:  Bone density:   Allergies  Allergen Reactions  . Demerol Itching and Nausea And Vomiting  . Erythromycin Rash    Can take zpak    Current Outpatient Prescriptions  Medication Sig Dispense Refill  . cetirizine (ZYRTEC) 10 MG tablet Take 1 tablet (10 mg total) by mouth daily. 30 tablet 11  . dexamethasone (DECADRON) 4 MG tablet Take 1 tablet (4 mg total) by mouth 2 (two) times daily. 60 tablet 0  . diphenoxylate-atropine (LOMOTIL) 2.5-0.025 MG tablet Take 2 tablets by mouth 4 (four) times daily as needed for diarrhea or loose stools. 40 tablet 0  . fluticasone (FLONASE) 50 MCG/ACT nasal spray Place 1 spray into the nose daily.    [name] gabapentin (NEURONTIN) 300 MG capsule Take 2 capsules (600 mg total) by mouth 3 (three) times daily. 300 mg at night for one day, twice daily for one day, then three times daily 180 capsule 2  . hyaluronate sodium (RADIAPLEXRX) GEL Apply 1 application topically.    [name] HYDROcodone-acetaminophen (NORCO) 7.5-325 MG tablet Take 1-2 tablets by mouth every 6 (six) hours as needed for severe pain (give one tab, then if other tab is needed, may give 30 min later.). 60 tablet 0  . ibuprofen (ADVIL,MOTRIN) 200 MG tablet Take 800 mg by mouth every 6 (six) hours as needed for mild pain.    [name] lamoTRIgine (LAMICTAL) 100 MG tablet Take 1.5 tablets (150 mg total) by mouth 2 (two) times daily. 60 tablet 3  . lidocaine-prilocaine (EMLA) cream Apply 1 application topically as needed. 30 g 0  . lithium carbonate 300 MG capsule Take 1 capsule (300 mg total) by mouth 2 (two) times daily with a meal. 60 capsule 3  . Loperamide HCl (IMODIUM PO) Take 1 tablet by mouth daily as needed (diarhea).    . LORazepam (ATIVAN) 0.5 MG tablet Take 1 tablet (0.5 mg total) by mouth every 8 (eight) hours as needed for anxiety. 30 tablet 1  . ondansetron (ZOFRAN) 8 MG tablet Take 1 tablet (8 mg total) by  mouth every 8 (eight) hours as needed for nausea or vomiting. 30 tablet 1  . pantoprazole (PROTONIX) 40 MG tablet Take 1 tablet (40 mg total) by mouth daily. 30 tablet 3  . polyethylene glycol powder (GLYCOLAX/MIRALAX) powder Take 17 g by mouth 2 (two) times daily as needed for mild constipation. (Patient not taking: Reported on 08/13/2016) 3350 g 3  . potassium chloride (K-DUR) 10 MEQ tablet Take 1 tablet (10 mEq total) by mouth daily. 30 tablet 3  . prochlorperazine (COMPAZINE) 10 MG tablet Take 10 mg by mouth daily as needed for nausea.     [name] tiZANidine (ZANAFLEX) 4  MG tablet Take 1 tablet (4 mg total) by mouth daily. 30 tablet 0  . traMADol (ULTRAM) 50 MG tablet Take 1 tablet (50 mg total) by mouth every 6 (six) hours as needed. 60 tablet 0  . trazodone (DESYREL) 300 MG tablet Take 1 tablet (300 mg total) by mouth at bedtime. 30 tablet 3  . triamcinolone cream (KENALOG) 0.5 % Apply 1 application topically daily as needed.  1  . trolamine salicylate (ASPERCREME) 10 % cream Apply 1 application topically as needed for muscle pain. Reported on 06/01/2016    . varenicline (CHANTIX) 0.5 MG tablet Take 1 tablet (0.5 mg total) by mouth daily. 30 tablet 3  . Vitamin D, Cholecalciferol, 1000 units CAPS Take 1,000 Units by mouth daily.     No current facility-administered medications for this visit.     OBJECTIVE: Middle-aged white woman  Vitals:   12/24/16 1131  BP: 124/78  Pulse: (!) 114  Resp: 18  Temp: 97.9 F (36.6 C)  TempSrc: Oral  SpO2: 97%  Weight: 147 lb 8 oz (66.9 kg)  Height: 5' 5"  (1.651 m)  Body mass index is 24.55 kg/m.  Sclerae unicteric, pupils round and equal Oropharynx clear and moist-- no thrush or other lesions No cervical or supraclavicular adenopathy Lungs no rales or rhonchi Heart regular rate and rhythm Abd soft, nontender, positive bowel sounds MSK no focal spinal tenderness, no upper extremity lymphedema Neuro: nonfocal, well oriented, appropriate  affect Breasts: The right breast is unremarkable. The left breast has been treated with mastectomy followed by radiation and there is no evidence of chest wall recurrence. Both axillae are benign.  LAB RESULTS:  CMP     Component Value Date/Time   NA 140 08/13/2016 0926   NA 139 07/28/2016 1118   K 4.3 08/13/2016 0926   K 4.1 07/28/2016 1118   CL 107 08/13/2016 0926   CO2 25 08/13/2016 0926   CO2 26 07/28/2016 1118   GLUCOSE 96 08/13/2016 0926   GLUCOSE 91 07/28/2016 1118   BUN 8 08/13/2016 0926   BUN 8.6 07/28/2016 1118   CREATININE 0.94 08/13/2016 0926   CREATININE 0.8 07/28/2016 1118   CALCIUM 9.2 08/13/2016 0926   CALCIUM 9.7 07/28/2016 1118   PROT 6.7 07/28/2016 1118   ALBUMIN 4.0 07/28/2016 1118   AST 18 07/28/2016 1118   ALT 13 07/28/2016 1118   ALKPHOS 50 07/28/2016 1118   BILITOT <0.30 07/28/2016 1118   GFRNONAA >60 08/13/2016 0926   GFRAA >60 08/13/2016 0926    INo results found for: SPEP, UPEP  Lab Results  Component Value Date   WBC 5.0 08/13/2016   NEUTROABS 4.2 07/28/2016   HGB 12.9 08/13/2016   HCT 40.8 08/13/2016   MCV 96.9 08/13/2016   PLT 171 08/13/2016      Chemistry      Component Value Date/Time   NA 140 08/13/2016 0926   NA 139 07/28/2016 1118   K 4.3 08/13/2016 0926   K 4.1 07/28/2016 1118   CL 107 08/13/2016 0926   CO2 25 08/13/2016 0926   CO2 26 07/28/2016 1118   BUN 8 08/13/2016 0926   BUN 8.6 07/28/2016 1118   CREATININE 0.94 08/13/2016 0926   CREATININE 0.8 07/28/2016 1118      Component Value Date/Time   CALCIUM 9.2 08/13/2016 0926   CALCIUM 9.7 07/28/2016 1118   ALKPHOS 50 07/28/2016 1118   AST 18 07/28/2016 1118   ALT 13 07/28/2016 1118   BILITOT <0.30 07/28/2016 1118  No results found for: LABCA2  No components found for: [id number]  No results for input(s): INR in the last 168 hours.  Urinalysis    Component Value Date/Time   COLORURINE YELLOW 04/18/2013 [place] 04/18/2013 1222    LABSPEC 1.011 04/18/2013 1222   PHURINE 5.5 04/18/2013 1222   GLUCOSEU NEGATIVE 04/18/2013 1222   HGBUR TRACE (A) 04/18/2013 1222   BILIRUBINUR negative 03/28/2015 1321   KETONESUR NEGATIVE 04/18/2013 1222   PROTEINUR negative 03/28/2015 1321   PROTEINUR NEGATIVE 04/18/2013 1222   UROBILINOGEN 0.2 03/28/2015 1321   UROBILINOGEN 0.2 04/18/2013 1222   NITRITE negative 03/28/2015 1321   NITRITE NEGATIVE 04/18/2013 1222   LEUKOCYTESUR Trace 03/28/2015 1321     STUDIES: Ct Head Wo Contrast  Result Date: 12/03/2016 CLINICAL DATA:  Increased altered mental status, fell 3 days ago. History of breast cancer with metastasis to the brain. EXAM: CT HEAD WITHOUT CONTRAST TECHNIQUE: Contiguous axial images were obtained from the base of the skull through the vertex without intravenous contrast. COMPARISON:  MRI from 09/08/2016.  Sore FINDINGS: Brain: No evidence of acute infarction, hemorrhage, hydrocephalus, nor extra-axial collection. Suboccipital postoperative change involving the left skull base. Tiny focus of hypodensity in the left cerebellar hemisphere consistent with a tiny focus of postoperative encephalomalacia. No new intra-axial mass like abnormality identified. Vascular: Nonacute Skull: Postop change left skullbase. Sinuses/Orbits: Nonacute. Other: None IMPRESSION: Left suboccipital postoperative change with tiny focus of encephalomalacia at the site of prior cerebellar metastasis. No acute intracranial abnormality identified. Electronically Signed   By: Ashley Royalty M.D.   On: 12/03/2016 15:32   Mr Jeri Cos MV Contrast  Result Date: 12/10/2016 CLINICAL DATA:  56 year old female with metastatic breast cancer. Status post resection of left cerebellar metastasis in September. Recent Altered mental status. Subsequent encounter. EXAM: MRI HEAD WITHOUT AND WITH CONTRAST TECHNIQUE: Multiplanar, multiecho pulse sequences of the brain and surrounding structures were obtained without and with intravenous  contrast. CONTRAST:  89m MULTIHANCE GADOBENATE DIMEGLUMINE 529 MG/ML IV SOLN COMPARISON:  Head CT without contrast 12/03/2016. Post treatment brain MRI 09/08/2016 and earlier. FINDINGS: Brain: No restricted diffusion or evidence of acute infarction. No ventriculomegaly. No acute intracranial hemorrhage identified. Sequelae of left suboccipital craniectomy. The residual stellate enhancement described in October at the left cerebellar resection site has regressed (series 10, image 30 today versus series 10, image 18 previously. Mild overlying dural thickening appears stable and is likely postoperative. No strong evidence of leptomeningeal metastatic disease today. No cerebellar edema. No posterior fossa mass effect. No new or abnormal intracranial enhancement elsewhere. Stable gray and white matter signal throughout the brain. No encephalomalacia. No chronic cerebral blood products. Negative pituitary and cervicomedullary junction. Vascular: Major intracranial vascular flow voids are stable and within normal limits. Skull and upper cervical spine: Negative visualized cervical spinal cord. Visible bone marrow signal is stable and within normal limits. Sinuses/Orbits: Visualized paranasal sinuses and mastoids are stable and well pneumatized. Orbits soft tissues appear stable and normal. Other: Visible internal auditory structures appear normal. No acute scalp soft tissue findings. IMPRESSION: 1. Satisfactory post treatment appearance with regressed enhancement at the left cerebellar resection site since October. No residual or recurrent metastatic tumor suspected. No evidence of leptomeningeal disease. No new metastatic disease to the brain. 2.  No acute intracranial abnormality. Electronically Signed   By: HGenevie AnnM.D.   On: 12/10/2016 10:47     ELIGIBLE FOR AVAILABLE RESEARCH PROTOCOL: no  ASSESSMENT: 56y.o. [place] woman with stage  IV left-sided breast cancer involving bone and central nervous  system  (1) s/p left breast lower inner quadrant biopsy 06/19/2015 for a clinical T2-3 NX invasive ductal carcinoma, grade 2, triple positive.  (2) status post left mastectomy and axillary lymph node dissection  with immediate expander placement 07/18/2015 for an mpT4 pN2,stage IIIB invasive ductal carcinoma, grade 3, with negative margins.  (a) definitive implant exchange to be scheduled in December   METASTATIC DISEASE: October 2016  (3) CT scan of the chest abdomen and pelvis  09/16/2015 shows metastatic lesions in the right scapula, left iliac crest, L4, and T spine. There were questionable liver cysts, with repeat CT scan 03/02/2016 showing possible right upper lobe lung lesions and possibly increased liver lesions  (a) CT scan of the chest 06/17/2016 shows no active disease in the lungs or liver  (b) Bone scan July 2017 showed no evidence of bony metastatic disease   (c) head CT 07/08/2016 showed a cerebellar lesion, confirmed by MRI 07/23/2016, status post craniotomy 08/14/2016, confirming a metastatic deposit which was estrogen and progesterone receptor negative, HER-2 amplified with a signals ratio of 7.16, number per cell 13.25  (d) CA 27-29 not informative  (4) received docetaxel every 3 weeks 6 together with trastuzumab and pertuzumab, last docetaxel dose 02/11/2016  (5) adjuvant radiation7/03/2016 to 06/26/2016 at [facility]: 1. The Left chest wall was treated to 23.4 Gy in 13 fractions at 1.8 Gy per fraction. 2. The Left chest wall was boosted to 10 Gy in 5 fractions at 2 Gy per fraction. 3. The Left Sclav/PAB was treated to 23.4 Gy in 13 fractions at 1.8 Gy per fraction.  Including the patient's treatment in [place] (received 15 fractions per Dr. Maryan Rued near [place], [place]), the patient received 50.4 Gy to the left chest wall and supraclavicular region.   (6) started trastuzumab and pertuzumab October 2016, continuing every 3 weeks,  (a) echocardiogram 02/26/2016 showed a  well preserved ejection fraction  (b) echocardiogram 07/01/2016 shows an ejection fraction in the 60-65%   (c) pertuzumab discontinued 08/25/2016 with uncontrolled diarrhea  (d) echocardiogram 11/11/2016 showed an ejection fraction in the 60-65%  (7) started denosumab/Xgeva October 2017, repeated every 6 weeks  (8) started anastrozole October 2017   (a) bone scan 11/10/2016 shows no active disease  (b) chest CT scan 11/10/2016 stable, with no evidence of active disease  (9) history of bipolar disorder  (a) lithium level checked every 6 weeks  (10) mild anemia with a significant drop in the MCV, ferritin 10 06/03/2016,   (a) Feraheme given 06/12/2016 and 06/18/2016  (11) tobacco abuse: Chantix started 06/18/2016  (12) 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  (a) brain irradiation10/19/17-11/08/17: Whole brain/ 35 Gy in 14 fractions   (b) repeat brain MRI obtained 12/10/2016 shows no active disease in the brain   PLAN: I spent approximately 30 minutes with Warren Lacy with most of that time spent discussing her complex problems.  We reviewed her scans and so far as I am able to tell there is no evidence of active breast cancer at present. Of course his disease is not curable and she does need to continue treatment. The plan is to do anastrozole and trastuzumab indefinitely.  She just had a repeat echocardiogram in December. That will be repeated in April.  What were as are the most, of course, is recurrence to the brain. She is scheduled for a repeat test left 3 brain MRI in April. She will continue  to be followed through the brain tumor group as well as myself. Again I reassured her that at this point we do not see evidence of disease activity.  She is using tramadol for pain very appropriately. That was refilled today  I strongly urged her to look herself up with the psychiatric counselors, best can manage her problems with anxiety and her  psychiatric medications.  I set her up for her next treatment on January 30. She knows to call for any problems that may develop before the next visit here.     Jillyn Ledger, MD   08/25/2016 1:59 PM Medical Oncology and Hematology [facility] [address] [place], [place] [address] Tel. [phone]    Fax. [phone]

## 2016-12-24 NOTE — Progress Notes (Signed)
RN visit only. 

## 2016-12-24 NOTE — Progress Notes (Signed)
[place] Work  Clinical Social Work was referred by nurse for assessment of psychosocial needs due to possible care needs.  Clinical Social Worker has made repeated attempts to contact patient and caregiver, Cletis at home to offer support and assess for needs.  Per CSW [place], no way to leave a message on provided number. This CSW to meet with pt at her appointment today and review ALF list vs PCS support at her residence.     Loren Racer, [place] Worker [place]  [place] Phone: [phone] Fax: [phone]

## 2016-12-28 ENCOUNTER — Encounter: Payer: Self-pay | Admitting: *Deleted

## 2016-12-28 NOTE — Progress Notes (Signed)
[place] Work  Holiday representative received phone call from patient requesting information on the Intensive Outpatient program at [facility].  CSW provided contact information and brief description of the program.  Patient stated she planned to call [facility] to get additional information.  CSW encouraged patient to call CSW if she has any difficulties or questions/concerns.   Johnnye Lana, MSW, LCSW, OSW-C Clinical Social Worker [facility] [phone]

## 2016-12-29 ENCOUNTER — Ambulatory Visit (HOSPITAL_BASED_OUTPATIENT_CLINIC_OR_DEPARTMENT_OTHER): Payer: Medicaid Other

## 2016-12-29 ENCOUNTER — Encounter: Payer: Self-pay | Admitting: *Deleted

## 2016-12-29 ENCOUNTER — Other Ambulatory Visit (HOSPITAL_BASED_OUTPATIENT_CLINIC_OR_DEPARTMENT_OTHER): Payer: Medicaid Other

## 2016-12-29 VITALS — BP 122/74 | HR 99 | Temp 98.2°F | Resp 19

## 2016-12-29 DIAGNOSIS — Z5112 Encounter for antineoplastic immunotherapy: Secondary | ICD-10-CM | POA: Diagnosis not present

## 2016-12-29 DIAGNOSIS — C50312 Malignant neoplasm of lower-inner quadrant of left female breast: Secondary | ICD-10-CM

## 2016-12-29 DIAGNOSIS — C7951 Secondary malignant neoplasm of bone: Secondary | ICD-10-CM | POA: Diagnosis not present

## 2016-12-29 DIAGNOSIS — C7931 Secondary malignant neoplasm of brain: Secondary | ICD-10-CM | POA: Diagnosis not present

## 2016-12-29 DIAGNOSIS — C773 Secondary and unspecified malignant neoplasm of axilla and upper limb lymph nodes: Secondary | ICD-10-CM

## 2016-12-29 DIAGNOSIS — C50919 Malignant neoplasm of unspecified site of unspecified female breast: Secondary | ICD-10-CM

## 2016-12-29 LAB — CBC WITH DIFFERENTIAL/PLATELET
BASO%: 0.3 % (ref 0.0–2.0)
Basophils Absolute: 0 10*3/uL (ref 0.0–0.1)
EOS%: 2.5 % (ref 0.0–7.0)
Eosinophils Absolute: 0.1 10*3/uL (ref 0.0–0.5)
HCT: 40 % (ref 34.8–46.6)
HGB: 13.6 g/dL (ref 11.6–15.9)
LYMPH%: 16.3 % (ref 14.0–49.7)
MCH: 33.2 pg (ref 25.1–34.0)
MCHC: 33.9 g/dL (ref 31.5–36.0)
MCV: 97.9 fL (ref 79.5–101.0)
MONO#: 0.4 10*3/uL (ref 0.1–0.9)
MONO%: 8 % (ref 0.0–14.0)
NEUT#: 3.3 10*3/uL (ref 1.5–6.5)
NEUT%: 72.9 % (ref 38.4–76.8)
Platelets: 233 10*3/uL (ref 145–400)
RBC: 4.09 10*6/uL (ref 3.70–5.45)
RDW: 13.3 % (ref 11.2–14.5)
WBC: 4.5 10*3/uL (ref 3.9–10.3)
lymph#: 0.7 10*3/uL — ABNORMAL LOW (ref 0.9–3.3)

## 2016-12-29 LAB — COMPREHENSIVE METABOLIC PANEL
ALT: 29 U/L (ref 0–55)
AST: 33 U/L (ref 5–34)
Albumin: 3.8 g/dL (ref 3.5–5.0)
Alkaline Phosphatase: 53 U/L (ref 40–150)
Anion Gap: 10 mEq/L (ref 3–11)
BUN: <4 mg/dL — ABNORMAL LOW (ref 7.0–26.0)
CO2: 27 mEq/L (ref 22–29)
Calcium: 9.6 mg/dL (ref 8.4–10.4)
Chloride: 104 mEq/L (ref 98–109)
Creatinine: 0.9 mg/dL (ref 0.6–1.1)
EGFR: 73 mL/min/{1.73_m2} — ABNORMAL LOW (ref >90)
Glucose: 100 mg/dl (ref 70–140)
Potassium: 3 mEq/L — CL (ref 3.5–5.1)
Sodium: 142 mEq/L (ref 136–145)
Total Bilirubin: 0.37 mg/dL (ref 0.20–1.20)
Total Protein: 6.3 g/dL — ABNORMAL LOW (ref 6.4–8.3)

## 2016-12-29 MED ORDER — DIPHENHYDRAMINE HCL 25 MG PO CAPS: 25.0000 mg | ORAL_CAPSULE | Freq: Once | ORAL | Status: DC

## 2016-12-29 MED ORDER — TRASTUZUMAB CHEMO 150 MG IV SOLR
6.0000 mg/kg | Freq: Once | INTRAVENOUS | Status: AC
  Administered 2016-12-29: 420 mg via INTRAVENOUS
  Filled 2016-12-29: qty 20

## 2016-12-29 MED ORDER — DENOSUMAB 120 MG/1.7ML ~~LOC~~ SOLN
120.0000 mg | Freq: Once | SUBCUTANEOUS | Status: AC
  Administered 2016-12-29: 120 mg via SUBCUTANEOUS
  Filled 2016-12-29: qty 1.7

## 2016-12-29 MED ORDER — SODIUM CHLORIDE 0.9% FLUSH
10.0000 mL | INTRAVENOUS | Status: DC | PRN
  Filled 2016-12-29: qty 10

## 2016-12-29 MED ORDER — ACETAMINOPHEN 325 MG PO TABS
650.0000 mg | ORAL_TABLET | Freq: Once | ORAL | Status: AC
  Administered 2016-12-29: 650 mg via ORAL

## 2016-12-29 MED ORDER — SODIUM CHLORIDE 0.9 % IV SOLN
Freq: Once | INTRAVENOUS | Status: AC
  Administered 2016-12-29: 09:00:00 via INTRAVENOUS

## 2016-12-29 MED ORDER — ACETAMINOPHEN 325 MG PO TABS
ORAL_TABLET | ORAL | Status: AC
  Filled 2016-12-29: qty 2

## 2016-12-29 MED ORDER — DIPHENHYDRAMINE HCL 25 MG PO CAPS
ORAL_CAPSULE | ORAL | Status: AC
  Filled 2016-12-29: qty 1

## 2016-12-29 MED ORDER — SODIUM CHLORIDE 0.9 % IV SOLN
Freq: Once | INTRAVENOUS | Status: AC
  Administered 2016-12-29: 11:00:00 via INTRAVENOUS
  Filled 2016-12-29: qty 250

## 2016-12-29 MED ORDER — HEPARIN SOD (PORK) LOCK FLUSH 100 UNIT/ML IV SOLN
500.0000 [IU] | Freq: Once | INTRAVENOUS | Status: DC | PRN
  Filled 2016-12-29: qty 5

## 2016-12-29 NOTE — Progress Notes (Signed)
[place] Work  Holiday representative met with patient in the infusion room to offer support and review resources.  CSW and patient discussed applying for disability and patient was agreeable to a referral to the [facility].  CSW and patient also reviewed and completed the SCAT application to assist with transportation needs.  CSW confirmed patients appointment  With [facility] of the [place] for March 04, 2017.  CSW also provided patient with information on walk in clinics for mental health services/medication management at [facility].  Patient was familiar with these agencies.  Patient stated she had an appointment with her PCP on February 1st.  CSW provided patient with PCS-Personal [facility] application for her PCP to complete.  Patient verbalized understanding and knows to call CSW if she has any difficulties.  CSW will continue to support as needed.   Johnnye Lana, MSW, LCSW, OSW-C Clinical Social Worker [facility] [phone]

## 2016-12-29 NOTE — Progress Notes (Signed)
Pt questioned why she is not getting Perjeta today. Read MD's note discussed with pt per MD she is to only receive Herceptin. Pt began to cry, tearfully asking for me to contact MD for clarification. MD in infusion room, discussed above concern, MD at chairside to speak with pt regarding treatment plan.

## 2016-12-29 NOTE — Patient Instructions (Signed)
[facility] Discharge Instructions for Patients Receiving Chemotherapy  Today you received the following chemotherapy agents: Herceptin   To help prevent nausea and vomiting after your treatment, we encourage you to take your nausea medication as directed.    If you develop nausea and vomiting that is not controlled by your nausea medication, call the clinic.   BELOW ARE SYMPTOMS THAT SHOULD BE REPORTED IMMEDIATELY:  *FEVER GREATER THAN 100.5 F  *CHILLS WITH OR WITHOUT FEVER  NAUSEA AND VOMITING THAT IS NOT CONTROLLED WITH YOUR NAUSEA MEDICATION  *UNUSUAL SHORTNESS OF BREATH  *UNUSUAL BRUISING OR BLEEDING  TENDERNESS IN MOUTH AND THROAT WITH OR WITHOUT PRESENCE OF ULCERS  *URINARY PROBLEMS  *BOWEL PROBLEMS  UNUSUAL RASH Items with * indicate a potential emergency and should be followed up as soon as possible.  Feel free to call the clinic you have any questions or concerns. The clinic phone number is [phone].  Please show the CHEMO ALERT CARD at check-in to the Emergency Department and triage nurse.   

## 2016-12-30 LAB — LITHIUM LEVEL: Lithium: 0.7 mmol/L (ref 0.6–1.2)

## 2016-12-30 LAB — CANCER ANTIGEN 27.29: CA 27.29: 13 U/mL (ref 0.0–38.6)

## 2016-12-31 ENCOUNTER — Ambulatory Visit: Payer: Medicaid Other | Admitting: Family Medicine

## 2017-01-01 ENCOUNTER — Encounter: Payer: Self-pay | Admitting: *Deleted

## 2017-01-01 NOTE — Progress Notes (Signed)
[place] Work  Clinical Social Work received follow up call from the [place] office, pt was approved for SCAT and can start using this resource currently. SCAT attempted to contact pt, but could not reach her. CSW attempted to call and left message and encouraged pt to return call.   Clinical Social Work interventions: Resource education  Loren Racer, [place] Worker [place]  [facility] Phone: [phone] Fax: [phone]

## 2017-01-06 ENCOUNTER — Ambulatory Visit: Payer: Medicaid Other | Attending: Family Medicine | Admitting: Family Medicine

## 2017-01-06 ENCOUNTER — Encounter: Payer: Self-pay | Admitting: Licensed Clinical Social Worker

## 2017-01-06 ENCOUNTER — Encounter: Payer: Self-pay | Admitting: Family Medicine

## 2017-01-06 VITALS — BP 110/75 | HR 112 | Temp 97.4°F | Ht 65.0 in | Wt 146.0 lb

## 2017-01-06 DIAGNOSIS — Z9889 Other specified postprocedural states: Secondary | ICD-10-CM | POA: Diagnosis not present

## 2017-01-06 DIAGNOSIS — F17200 Nicotine dependence, unspecified, uncomplicated: Secondary | ICD-10-CM | POA: Diagnosis not present

## 2017-01-06 DIAGNOSIS — Z923 Personal history of irradiation: Secondary | ICD-10-CM | POA: Insufficient documentation

## 2017-01-06 DIAGNOSIS — F3130 Bipolar disorder, current episode depressed, mild or moderate severity, unspecified: Secondary | ICD-10-CM

## 2017-01-06 DIAGNOSIS — M797 Fibromyalgia: Secondary | ICD-10-CM | POA: Diagnosis not present

## 2017-01-06 DIAGNOSIS — I4891 Unspecified atrial fibrillation: Secondary | ICD-10-CM | POA: Diagnosis not present

## 2017-01-06 DIAGNOSIS — R410 Disorientation, unspecified: Secondary | ICD-10-CM | POA: Diagnosis not present

## 2017-01-06 DIAGNOSIS — R251 Tremor, unspecified: Secondary | ICD-10-CM | POA: Insufficient documentation

## 2017-01-06 DIAGNOSIS — C7931 Secondary malignant neoplasm of brain: Secondary | ICD-10-CM | POA: Insufficient documentation

## 2017-01-06 DIAGNOSIS — R5383 Other fatigue: Secondary | ICD-10-CM | POA: Diagnosis not present

## 2017-01-06 DIAGNOSIS — Z888 Allergy status to other drugs, medicaments and biological substances status: Secondary | ICD-10-CM | POA: Diagnosis not present

## 2017-01-06 DIAGNOSIS — C50312 Malignant neoplasm of lower-inner quadrant of left female breast: Secondary | ICD-10-CM | POA: Diagnosis not present

## 2017-01-06 DIAGNOSIS — C50919 Malignant neoplasm of unspecified site of unspecified female breast: Secondary | ICD-10-CM

## 2017-01-06 DIAGNOSIS — Z9012 Acquired absence of left breast and nipple: Secondary | ICD-10-CM | POA: Diagnosis not present

## 2017-01-06 DIAGNOSIS — K219 Gastro-esophageal reflux disease without esophagitis: Secondary | ICD-10-CM | POA: Insufficient documentation

## 2017-01-06 DIAGNOSIS — E876 Hypokalemia: Secondary | ICD-10-CM | POA: Diagnosis present

## 2017-01-06 DIAGNOSIS — Z87891 Personal history of nicotine dependence: Secondary | ICD-10-CM | POA: Diagnosis not present

## 2017-01-06 DIAGNOSIS — R269 Unspecified abnormalities of gait and mobility: Secondary | ICD-10-CM | POA: Insufficient documentation

## 2017-01-06 DIAGNOSIS — Z79899 Other long term (current) drug therapy: Secondary | ICD-10-CM | POA: Insufficient documentation

## 2017-01-06 DIAGNOSIS — G8929 Other chronic pain: Secondary | ICD-10-CM | POA: Insufficient documentation

## 2017-01-06 DIAGNOSIS — R42 Dizziness and giddiness: Secondary | ICD-10-CM | POA: Insufficient documentation

## 2017-01-06 DIAGNOSIS — C7951 Secondary malignant neoplasm of bone: Secondary | ICD-10-CM | POA: Diagnosis not present

## 2017-01-06 DIAGNOSIS — F319 Bipolar disorder, unspecified: Secondary | ICD-10-CM | POA: Insufficient documentation

## 2017-01-06 DIAGNOSIS — R636 Underweight: Secondary | ICD-10-CM | POA: Insufficient documentation

## 2017-01-06 DIAGNOSIS — F4310 Post-traumatic stress disorder, unspecified: Secondary | ICD-10-CM | POA: Diagnosis not present

## 2017-01-06 DIAGNOSIS — Z79811 Long term (current) use of aromatase inhibitors: Secondary | ICD-10-CM | POA: Diagnosis not present

## 2017-01-06 DIAGNOSIS — F1010 Alcohol abuse, uncomplicated: Secondary | ICD-10-CM | POA: Insufficient documentation

## 2017-01-06 LAB — BASIC METABOLIC PANEL
BUN: 6 mg/dL — ABNORMAL LOW (ref 7–25)
CO2: 23 mmol/L (ref 20–31)
Calcium: 10.3 mg/dL (ref 8.6–10.4)
Chloride: 105 mmol/L (ref 98–110)
Creat: 0.89 mg/dL (ref 0.50–1.05)
Glucose, Bld: 102 mg/dL — ABNORMAL HIGH (ref 65–99)
Potassium: 4.3 mmol/L (ref 3.5–5.3)
Sodium: 142 mmol/L (ref 135–146)

## 2017-01-06 MED ORDER — POTASSIUM CHLORIDE ER 10 MEQ PO CPCR: 10.0000 meq | ORAL_CAPSULE | Freq: Every day | ORAL | 1 refills | Status: DC

## 2017-01-06 MED ORDER — DIPHENOXYLATE-ATROPINE 2.5-0.025 MG PO TABS: 1.0000 | ORAL_TABLET | Freq: Four times a day (QID) | ORAL | 1 refills | Status: DC | PRN

## 2017-01-06 NOTE — Progress Notes (Signed)
Has not been doing well since the brain cancer Lithium check Needs lomotil, higher dose of chanix and potassium

## 2017-01-06 NOTE — BH Specialist Note (Signed)
Session Start time: 10:30 AM   End Time: 11:00 AM Total Time:  30 minutes Type of Service: [place] Interpreter: No.   Interpreter Name & Language: N/A # [facility] Visits July 2017-June 2018: 1st   SUBJECTIVE: [name] is a 56 y.o. female  Pt. was referred by Dr. Jarold Song for:  anxiety and depression. Pt. reports the following symptoms/concerns: overwhelming feelings of sadness and worry, difficulty sleeping, racing thoughts, decreased appetite, irritability, low energy and motivation Duration of problem:  Pt reports being diagnosed with Bipolar, Anxiety, and Depression in 2013. Onset of symptoms occurred December 2017 Severity: severe Previous treatment: Pt received services behavioral health services in the past soon after diagnosis. Pt has a scheduled appointment with [facility] on March 04, 2017; however, will walk in today to be seen by psychiatrist sooner.   OBJECTIVE: Mood: Anxious and Irritable & Affect: Depressed Risk of harm to self or others: Pt denied SI/HI Assessments administered: PHQ-9; GAD-7  LIFE CONTEXT:  Family & Social: Pt resides at a transitional living facility with two other residents. Pt's father, sisters, and two sons reside approx two hours away in different parts of [place] [employer]/ Work: Pt receives [employer] and [employer] [phone]) Self-Care: Pt has difficulty sleeping and decreased appetite. She has hx of substance abuse; however, is actively sober. Pt smokes cigarettes Life changes: Pt has been diagnosed with breast and brain cancer. She underwent brain surgery September 2017 which has resulted in decreased motor skills, coordination, and manic behavior What is important to pt/family (values): Family, Good Health, Independence   GOALS ADDRESSED:  Decrease symptoms of depression Decrease symptoms of anxiety Increase adequate support systems for patient/family   INTERVENTIONS: Solution Focused, Strength-based and  Supportive   ASSESSMENT:  Pt currently experiencing depression and anxiety triggered by ongoing medical concerns that has resulted in a decrease of motor skills, coordination, and manic behavior. Pt was accompanied by [employer], Cletis Brinson [phone], with [facility]. Pt reports overwhelming feelings of sadness and worry, difficulty sleeping, racing thoughts, decreased appetite, irritability, low energy and motivation. Pt may benefit from psychoeducation, psychotherapy, and medication management. LCSWA educated pt on the cycle of depression and anxiety, in addition, to correlation between physical and mental health. Pt's feelings regarding her ongoing medical concerns were validated and LCSWA commended pt on her active participation in medical and mental health care. LCSWA discussed the benefits of applying healthy coping skills to decrease reported symptoms. Pt successfully identified healthy coping strategies to use on a daily basis and is open to strengthening support system. She was provided information on support groups, how to utilize medicaid transportation, SCAT, and reports provider completed referral for PCS. Pt has been referred to the [facility] to assist with applying for disability. She is knowledgeable about community resources that provide crisis intervention, psychotherapy, and medication management.     PLAN: 1. F/U with behavioral health clinician: Pt was encouraged to contact [place] if symptoms worsen or fail to improve to schedule behavioral appointments at [facility]. 2. Behavioral Health meds: Xanax, Desyrel, Lithium 3. Behavioral recommendations: LCSWA recommends that pt apply healthy coping skills discussed, follow through with behavioral health services with [facility], and utilize community resources. Pt is encouraged to schedule follow up appointment with LCSWA 4. Referral: Brief Counseling/Psychotherapy, [employer], Problem-solving  teaching/coping strategies, Psychoeducation and Supportive Counseling 5. From scale of 1-10, how likely are you to follow plan: [place], MSW, [place] Social Worker 01/08/17  9:32 AM  Warmhandoff:   Warm Hand Off Completed.

## 2017-01-06 NOTE — Progress Notes (Signed)
Subjective:  Patient ID: [name], female    DOB: November 11, 1961  Age: 56 y.o. 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  She complains that ever since her brain surgery in 07/2016 she has had intermittent gait abnormalities leading to her using a walker, she has had intermittent confusion (with hospitalizations for same) and has had tremors at rest.Denies alcohol consumption. MRI of the brain from last month was negative for active brain disease. She states prior to her brain surgery she was highly functional and able to work. Last visit to oncology was underweight/25/18.  Her appetite is not so great but she reports feeling well overall. Complains of some anxiety symptoms and has an upcoming appointment with [facility] services [facility] the [place]. Continues to smoke a couple of cigarettes per day and is using Chantix at the same time and is wondering if a dose of Chantix can be increased however she states her increased smoking is due to her anxiety. Accompanied by a caseworker from [place] and he needs an [place]@ form completed for the patient.  Past Medical History:  Diagnosis Date  . Alcohol abuse   . Anemia    during chemo  . Anxiety    At age 14  . Arthritis Dx 2010  . Bipolar disorder ([place])   . Cancer ([place])    breast mets to brain  . Chronic pain   . Complication of anesthesia   . Depression   . Fibromyalgia Dx 2005  . GERD (gastroesophageal  reflux disease)   . Headache    hx  migraines  . Opiate dependence ([place])   . PONV (postoperative nausea and vomiting)   . Port-a-cath in place   . PTSD (post-traumatic stress disorder)     Past Surgical History:  Procedure Laterality Date  . APPLICATION OF CRANIAL NAVIGATION N/A 08/14/2016   Procedure: APPLICATION OF CRANIAL NAVIGATION;  Surgeon: Erline Levine, MD;  Location: [facility];  Service: Neurosurgery;  Laterality: N/A;  . BREAST RECONSTRUCTION Left    with silicone implant  . CRANIOTOMY N/A 08/14/2016   Procedure: CRANIOTOMY TUMOR EXCISION WITH Lucky Rathke;  Surgeon: Erline Levine, MD;  Location: [facility];  Service: Neurosurgery;  Laterality: N/A;  . FIBULA FRACTURE SURGERY Left   . MASTECTOMY Left   . RADIOLOGY WITH ANESTHESIA N/A 07/23/2016   Procedure: MRI OF BRAIN WITH AND WITHOUT;  Surgeon: Medication Radiologist, MD;  Location: [facility];  Service: Radiology;  Laterality: N/A;  . RADIOLOGY WITH ANESTHESIA N/A 09/08/2016   Procedure: MRI OF BRAIN WITH AND WITHOUT CONTRAST;  Surgeon: Medication Radiologist, MD;  Location: [facility];  Service: Radiology;  Laterality: N/A;  . RADIOLOGY WITH ANESTHESIA N/A 12/10/2016   Procedure: MRI OF BRAIN WITH AND WITHOUT;  Surgeon: Medication Radiologist, MD;  Location: [facility];  Service: Radiology;  Laterality: N/A;  . right power port placement Right     Allergies  Allergen Reactions  . Demerol Itching and Nausea And Vomiting  . Erythromycin Rash    Can take zpak  Outpatient Medications Prior to Visit  Medication Sig Dispense Refill  . ALPRAZolam (XANAX) 1 MG tablet Take 1 tablet (1 mg total) by mouth at bedtime. 1/2 - 1 po qhs prn 30 tablet 0  . anastrozole (ARIMIDEX) 1 MG tablet Take 1 tablet (1 mg total) by mouth daily. 90 tablet 1  . BIOTIN PO Take 1 capsule by mouth daily.    . cetirizine (ZYRTEC) 10 MG tablet Take 1 tablet (10 mg total) by mouth daily. 30 tablet 11  . fluticasone (FLONASE) 50 MCG/ACT nasal spray Place 2 sprays  into both nostrils daily. 15.8 g 3  . gabapentin (NEURONTIN) 300 MG capsule Take 2 capsules (600 mg total) by mouth 3 (three) times daily. 300 mg at night for one day, twice daily for one day, then three times daily 180 capsule 2  . ibuprofen (ADVIL,MOTRIN) 200 MG tablet Take 800 mg by mouth every 6 (six) hours as needed for mild pain.    [name] lamoTRIgine (LAMICTAL) 100 MG tablet Take 1.5 tablets (150 mg total) by mouth 2 (two) times daily. 60 tablet 1  . lithium carbonate 300 MG capsule TAKE 1 CAPSULE BY MOUTH TWICE DAILY WITH A MEAL 60 capsule 0  . MULTIPLE VITAMIN PO Take by mouth every morning.    . ondansetron (ZOFRAN) 8 MG tablet Take 1 tablet (8 mg total) by mouth every 8 (eight) hours as needed for nausea or vomiting. 30 tablet 0  . pantoprazole (PROTONIX) 40 MG tablet TAKE 1 TABLET EVERY DAY 30 tablet 3  . traMADol (ULTRAM) 50 MG tablet Take 1 tablet (50 mg total) by mouth every 6 (six) hours as needed. 60 tablet 0  . traZODone (DESYREL) 150 MG tablet TAKE 2 TABLETS BY MOUTH AT BEDTIME. (Patient taking differently: TAKE 300 MG BY MOUTH AT BEDTIME) 60 tablet 3  . varenicline (CHANTIX) 0.5 MG tablet Take 1 tablet (0.5 mg total) by mouth daily. 30 tablet 3  . Vitamin D, Cholecalciferol, 1000 units CAPS Take 1,000 Units by mouth daily.    . diphenoxylate-atropine (LOMOTIL) 2.5-0.025 MG tablet Take 2 tablets by mouth 4 (four) times daily as needed for diarrhea or loose stools. 40 tablet 0  . potassium chloride (MICRO-K) 10 MEQ CR capsule Take 10 mEq by mouth daily.    [name] lidocaine-prilocaine (EMLA) cream Apply 1 application topically as needed. (Patient not taking: Reported on 01/06/2017) 30 g 0  . Benzocaine-Antipyrine 55-14 MG/ML SOLN 2-4 drops each ear three times a day. Wet cotton ball with drop and place in ear post instilling drops. 14 mL 0  . cephALEXin (KEFLEX) 500 MG capsule Take 1 capsule (500 mg total) by mouth 4 (four) times daily. 12 capsule 0  . dexamethasone (DECADRON) 2 MG tablet Take  1 tablet (2 mg total) by mouth daily. (Patient not taking: Reported on 12/18/2016) 30 tablet 0  . Loperamide HCl (IMODIUM PO) Take 1 tablet by mouth as needed (diarrhea).      No facility-administered medications prior to visit.     ROS Review of Systems  Constitutional: Positive for fatigue. Negative for activity change and appetite change.  HENT: Negative for congestion, sinus pressure and sore throat.   Eyes: Negative for visual disturbance.  Respiratory: Negative for cough, chest tightness, shortness of breath and wheezing.   Cardiovascular: Negative for chest pain and palpitations.  Gastrointestinal: Negative for abdominal distention, abdominal pain and constipation.  Endocrine: Negative for polydipsia.  Genitourinary: Negative for dysuria and frequency.  Musculoskeletal: Negative for arthralgias and  back pain.  Skin: Negative for rash.  Neurological: Positive for dizziness, tremors and weakness. Negative for light-headedness and numbness.  Hematological: Does not bruise/bleed easily.  Psychiatric/Behavioral: Negative for agitation and behavioral problems.    Objective:  BP 110/75 (BP Location: Right Arm, Patient Position: Sitting, Cuff Size: Large)   Pulse (!) 112   Temp 97.4 F (36.3 C) (Oral)   Ht 5\' 5"  (1.651 m)   Wt 146 lb (66.2 kg)   SpO2 98%   BMI 24.30 kg/m   BP/Weight 01/06/2017 12/29/2016 0000000  Systolic BP A999333 [id number]  Diastolic BP 75 74 78  Wt. (Lbs) 146 - 147.5  BMI 24.3 - 24.55  Some encounter information is confidential and restricted. Go to Review Flowsheets activity to see all data.      Physical Exam  Constitutional: She is oriented to person, place, and time. She appears well-developed and well-nourished.  Cardiovascular: Normal heart sounds and intact distal pulses.  Tachycardia present.   No murmur heard. Pulmonary/Chest: Effort normal and breath sounds normal. She has no wheezes. She has no rales. She exhibits no tenderness.  Abdominal:  Soft. Bowel sounds are normal. She exhibits no distension and no mass. There is no tenderness.  Musculoskeletal: Normal range of motion.  Neurological: She is alert and oriented to person, place, and time.  Unstable gait Motor strength-5/5 in all extremities     Assessment & Plan:   1. Hypokalemia Continue potassium supplements - Basic Metabolic Panel  2. Primary cancer of lower-inner quadrant of left female breast ([place]) Status post L mastectomy, axillary lymph node dissection and radiation - no active disease evident radiologically as per oncology but disease is not curable. Continue Anastrozole and Trastuzumab Keep appointment with oncology.  3. Metastatic breast cancer ([place]) Metastasis to the brain status post craniotomy, s/p brain radiation Currently with gait abnormalities, occasional tremors MRI brain from 11/2016 - no active disease in the brain Scheduled for repeat MRI of the brain in 2 months to evaluate for recurrence. Patient encouraged to schedule appointment with her brain surgeon ?might need pulse steroids  4. Current smoker Spent 5 minutes counseling on cessation and she is not very motivated to quit. She does have Chantix prescription at the pharmacy  5. Bipolar I disorder, most recent episode depressed ([place]) Has an upcoming appointment with [facility] the [place] Patient advised to seek an early appointment so she does not run out of her lithium which she has to receive from psychiatry. LCSW called in for therapy session. - Lithium level   Meds ordered this encounter  Medications  . diphenoxylate-atropine (LOMOTIL) 2.5-0.025 MG tablet    Sig: Take 1 tablet by mouth 4 (four) times daily as needed for diarrhea or loose stools.    Dispense:  40 tablet    Refill:  1  . potassium chloride (MICRO-K) 10 MEQ CR capsule    Sig: Take 1 capsule (10 mEq total) by mouth daily.    Dispense:  30 capsule    Refill:  1    Follow-up: Return in about 2 months  (around 03/06/2017) for coordination of care.   Arnoldo Morale MD

## 2017-01-07 ENCOUNTER — Other Ambulatory Visit: Payer: Self-pay | Admitting: Radiation Therapy

## 2017-01-07 DIAGNOSIS — C7931 Secondary malignant neoplasm of brain: Secondary | ICD-10-CM

## 2017-01-07 DIAGNOSIS — C7949 Secondary malignant neoplasm of other parts of nervous system: Secondary | ICD-10-CM

## 2017-01-07 LAB — LITHIUM LEVEL: Lithium Lvl: 0.8 mmol/L (ref 0.6–1.2)

## 2017-01-08 ENCOUNTER — Telehealth: Payer: Self-pay

## 2017-01-08 ENCOUNTER — Encounter: Payer: Self-pay | Admitting: *Deleted

## 2017-01-08 NOTE — Telephone Encounter (Signed)
Writer called patient per Dr. Jarold Song regarding lab results.  Patient stated understanding and states she will continue to take both medications.

## 2017-01-08 NOTE — Telephone Encounter (Signed)
-----   Message from Arnoldo Morale, MD sent at 01/07/2017  9:27 AM EST ----- Potassium and lithium levels are normal

## 2017-01-08 NOTE — Progress Notes (Signed)
[place] Work  Clinical Social Work met with pt for re-assessment of psychosocial needs.  Clinical Social Worker met with patient at [facility] alone to offer support and assess for needs. Pt sleepy today and had trouble remembering what time her appointment was today. Pt reports PCP completed PCS form, but she was not sure if they faxed the form into [facility]. She could not locate said form. She reports she went to [facility] to establish counseling and psychiatry services there and returns for follow up in April. CSW educated pt she was approved for SCAT and how to access transportation. Pt requested CSW assistance writing down appointments and organizing contacts in notebook, as pt described vision and writing issues are constant. Pt reports she is need of a shower chair and has been using her walker in the shower as it has a seat. Pt and CSW met with rep from [facility] and completed SSI and SSD applications. Pt appeared to have some issues with concept of time during the meeting and at the close of the meeting took her coat off and on several times before leaving.   CSW reached out to PCP CSW about possible help with PCS form and shower chair. They plan to work on this request. CSW feels pt could greatly benefit from having support at upcoming appointments and Support Team member will try to attend [facility] appointments. Pt plans to access SCAT for future appointments.   Clinical Social Work interventions:  Supportive Psychiatric nurse education and referral Resource coordination Pt advocacy  Loren Racer, LCSW, OSW-C Clinical Social Worker [place]  [place] Phone: [phone] Fax: [phone]

## 2017-01-11 ENCOUNTER — Emergency Department (HOSPITAL_COMMUNITY)
Admission: EM | Admit: 2017-01-11 | Discharge: 2017-01-12 | Disposition: A | Discharge status: Other Acute Inpatient Hospital | Payer: Medicaid Other | Attending: Dermatology | Admitting: Dermatology

## 2017-01-11 ENCOUNTER — Encounter (HOSPITAL_COMMUNITY): Payer: Self-pay | Admitting: Emergency Medicine

## 2017-01-11 DIAGNOSIS — Z853 Personal history of malignant neoplasm of breast: Secondary | ICD-10-CM | POA: Diagnosis not present

## 2017-01-11 DIAGNOSIS — F3130 Bipolar disorder, current episode depressed, mild or moderate severity, unspecified: Secondary | ICD-10-CM | POA: Diagnosis present

## 2017-01-11 DIAGNOSIS — F315 Bipolar disorder, current episode depressed, severe, with psychotic features: Secondary | ICD-10-CM | POA: Diagnosis not present

## 2017-01-11 DIAGNOSIS — F17210 Nicotine dependence, cigarettes, uncomplicated: Secondary | ICD-10-CM | POA: Insufficient documentation

## 2017-01-11 DIAGNOSIS — F323 Major depressive disorder, single episode, severe with psychotic features: Secondary | ICD-10-CM

## 2017-01-11 DIAGNOSIS — Z79899 Other long term (current) drug therapy: Secondary | ICD-10-CM | POA: Diagnosis not present

## 2017-01-11 DIAGNOSIS — R4182 Altered mental status, unspecified: Secondary | ICD-10-CM | POA: Diagnosis present

## 2017-01-11 DIAGNOSIS — G4700 Insomnia, unspecified: Secondary | ICD-10-CM | POA: Diagnosis not present

## 2017-01-11 DIAGNOSIS — Z9889 Other specified postprocedural states: Secondary | ICD-10-CM | POA: Diagnosis not present

## 2017-01-11 LAB — COMPREHENSIVE METABOLIC PANEL
ALT: 18 U/L (ref 14–54)
AST: 19 U/L (ref 15–41)
Albumin: 3.8 g/dL (ref 3.5–5.0)
Alkaline Phosphatase: 34 U/L — ABNORMAL LOW (ref 38–126)
Anion gap: 6 (ref 5–15)
BUN: 10 mg/dL (ref 6–20)
CO2: 25 mmol/L (ref 22–32)
Calcium: 9.3 mg/dL (ref 8.9–10.3)
Chloride: 106 mmol/L (ref 101–111)
Creatinine, Ser: 0.72 mg/dL (ref 0.44–1.00)
GFR calc Af Amer: >60 mL/min (ref >60)
GFR calc non Af Amer: >60 mL/min (ref >60)
Glucose, Bld: 89 mg/dL (ref 65–99)
Potassium: 3.7 mmol/L (ref 3.5–5.1)
Sodium: 137 mmol/L (ref 135–145)
Total Bilirubin: 0.5 mg/dL (ref 0.3–1.2)
Total Protein: 6 g/dL — ABNORMAL LOW (ref 6.5–8.1)

## 2017-01-11 LAB — RAPID URINE DRUG SCREEN, HOSP PERFORMED
Amphetamines: NOT DETECTED
Barbiturates: NOT DETECTED
Benzodiazepines: POSITIVE — AB
Cocaine: NOT DETECTED
Opiates: NOT DETECTED
Tetrahydrocannabinol: NOT DETECTED

## 2017-01-11 LAB — CBG MONITORING, ED: Glucose-Capillary: 116 mg/dL — ABNORMAL HIGH (ref 65–99)

## 2017-01-11 LAB — CBC
HCT: 39.8 % (ref 36.0–46.0)
Hemoglobin: 13 g/dL (ref 12.0–15.0)
MCH: 31 pg (ref 26.0–34.0)
MCHC: 32.7 g/dL (ref 30.0–36.0)
MCV: 94.8 fL (ref 78.0–100.0)
Platelets: 211 10*3/uL (ref 150–400)
RBC: 4.2 MIL/uL (ref 3.87–5.11)
RDW: 12.4 % (ref 11.5–15.5)
WBC: 6.3 10*3/uL (ref 4.0–10.5)

## 2017-01-11 LAB — ETHANOL: Alcohol, Ethyl (B): <5 mg/dL (ref <5)

## 2017-01-11 LAB — LITHIUM LEVEL
Lithium Lvl: 1.44 mmol/L — ABNORMAL HIGH (ref 0.60–1.20)
Lithium Lvl: 0.92 mmol/L (ref 0.60–1.20)

## 2017-01-11 LAB — ACETAMINOPHEN LEVEL: Acetaminophen (Tylenol), Serum: <10 ug/mL — ABNORMAL LOW (ref 10–30)

## 2017-01-11 LAB — SALICYLATE LEVEL: Salicylate Lvl: <7 mg/dL (ref 2.8–30.0)

## 2017-01-11 MED ORDER — LIDOCAINE-PRILOCAINE 2.5-2.5 % EX CREA
1.0000 "application " | TOPICAL_CREAM | CUTANEOUS | Status: DC | PRN
  Filled 2017-01-11: qty 5

## 2017-01-11 MED ORDER — ADULT MULTIVITAMIN W/MINERALS CH
1.0000 | ORAL_TABLET | Freq: Every day | ORAL | Status: DC
  Administered 2017-01-11 – 2017-01-12 (×2): 1 via ORAL
  Filled 2017-01-11 (×2): qty 1

## 2017-01-11 MED ORDER — POTASSIUM CHLORIDE ER 10 MEQ PO TBCR
10.0000 meq | EXTENDED_RELEASE_TABLET | Freq: Every day | ORAL | Status: DC
  Administered 2017-01-11 – 2017-01-12 (×2): 10 meq via ORAL
  Filled 2017-01-11 (×4): qty 1

## 2017-01-11 MED ORDER — ONDANSETRON HCL 4 MG PO TABS
8.0000 mg | ORAL_TABLET | Freq: Three times a day (TID) | ORAL | Status: DC | PRN
  Administered 2017-01-12 (×2): 8 mg via ORAL
  Filled 2017-01-11 (×2): qty 2

## 2017-01-11 MED ORDER — ANASTROZOLE 1 MG PO TABS
1.0000 mg | ORAL_TABLET | Freq: Every day | ORAL | Status: DC
Start: 2017-01-11 — End: 2017-01-12
  Administered 2017-01-12 (×2): 1 mg via ORAL
  Filled 2017-01-11 (×2): qty 1

## 2017-01-11 MED ORDER — LITHIUM CARBONATE 300 MG PO CAPS
300.0000 mg | ORAL_CAPSULE | Freq: Two times a day (BID) | ORAL | Status: DC
  Administered 2017-01-11 – 2017-01-12 (×2): 300 mg via ORAL
  Filled 2017-01-11 (×3): qty 1

## 2017-01-11 MED ORDER — SODIUM CHLORIDE 0.9 % IV BOLUS (SEPSIS)
1000.0000 mL | Freq: Once | INTRAVENOUS | Status: AC
  Administered 2017-01-11: 1000 mL via INTRAVENOUS

## 2017-01-11 MED ORDER — LAMOTRIGINE 25 MG PO TABS
150.0000 mg | ORAL_TABLET | Freq: Two times a day (BID) | ORAL | Status: DC
  Administered 2017-01-12 (×2): 150 mg via ORAL
  Filled 2017-01-11 (×2): qty 2

## 2017-01-11 MED ORDER — PANTOPRAZOLE SODIUM 40 MG PO TBEC
40.0000 mg | DELAYED_RELEASE_TABLET | Freq: Every day | ORAL | Status: DC
  Administered 2017-01-11 – 2017-01-12 (×2): 40 mg via ORAL
  Filled 2017-01-11 (×2): qty 1

## 2017-01-11 MED ORDER — IBUPROFEN 800 MG PO TABS: 800.0000 mg | ORAL_TABLET | Freq: Four times a day (QID) | ORAL | Status: DC | PRN

## 2017-01-11 MED ORDER — TRAZODONE HCL 100 MG PO TABS
300.0000 mg | ORAL_TABLET | Freq: Every day | ORAL | Status: DC
  Administered 2017-01-12: 300 mg via ORAL
  Filled 2017-01-11: qty 3

## 2017-01-11 MED ORDER — VITAMIN D3 25 MCG (1000 UNIT) PO TABS
1000.0000 [IU] | ORAL_TABLET | Freq: Every day | ORAL | Status: DC
  Administered 2017-01-12 (×2): 1000 [IU] via ORAL
  Filled 2017-01-11 (×2): qty 1

## 2017-01-11 MED ORDER — HEPARIN SOD (PORK) LOCK FLUSH 100 UNIT/ML IV SOLN
500.0000 [IU] | Freq: Once | INTRAVENOUS | Status: AC
  Administered 2017-01-11: 500 [IU]
  Filled 2017-01-11: qty 5

## 2017-01-11 MED ORDER — LORATADINE 10 MG PO TABS
10.0000 mg | ORAL_TABLET | Freq: Every day | ORAL | Status: DC
  Administered 2017-01-12: 10 mg via ORAL
  Filled 2017-01-11: qty 1

## 2017-01-11 MED ORDER — GABAPENTIN 300 MG PO CAPS
600.0000 mg | ORAL_CAPSULE | Freq: Three times a day (TID) | ORAL | Status: DC
  Administered 2017-01-11 – 2017-01-12 (×3): 600 mg via ORAL
  Filled 2017-01-11 (×3): qty 2

## 2017-01-11 MED ORDER — LAMOTRIGINE 100 MG PO TABS: 100.0000 mg | ORAL_TABLET | Freq: Two times a day (BID) | ORAL | Status: DC

## 2017-01-11 MED ORDER — TRAMADOL HCL 50 MG PO TABS: 50.0000 mg | ORAL_TABLET | Freq: Four times a day (QID) | ORAL | Status: DC | PRN

## 2017-01-11 MED ORDER — LAMOTRIGINE 25 MG PO TABS: 150.0000 mg | ORAL_TABLET | Freq: Two times a day (BID) | ORAL | Status: DC

## 2017-01-11 NOTE — ED Notes (Signed)
Cletis Dealer at [facility]-[phone].

## 2017-01-11 NOTE — Progress Notes (Signed)
CSW was asked to speak with patient and case manager, Cletis, regarding plans for discharge. Per notes, inpatient psych is currently being recommended. Patient stated she wanted to move back to [place]/ [place] once medically/ psych cleared to be closer to family. Patient stated she would like to live with relatives and own a car. Case manager discussed with patient and CSW that patient currently received medically care in the [place] area and did have support in the area. Patient stated "i cant really speak right now. Can we talk about this late". CSW informed patient and case manager that they could discuss options closer to discharge.   Patient requested phone to call family members to get their opinion. CSW will update RN.   Kingsley Spittle, LCSWA Clinical Social Worker [phone]

## 2017-01-11 NOTE — ED Notes (Signed)
Patient's son- [phone] Leigh-[phone]

## 2017-01-11 NOTE — ED Notes (Addendum)
Pt;s behavior is getting aggressive. Is not cooperating with taking vitals or getting urine sample. Pt said she wants something to eat now. Pt has stated that her and her family would like for her to stay over night to be evaluated.

## 2017-01-11 NOTE — ED Triage Notes (Addendum)
Per EMS pt c/o for progressive generalized weakness, some difficulty speaking sensibly. Seen 2 days ago for same, but symptoms worsening. No acute new changes. [place] a few days ago, ecchymosis to right back over lower rib cage . Hx breast cancer with mets to brain. Multiple MRIs in past, MRI scheduled for Monday. Ambulatory. A & O x 4 per standard questions but tried to talk to her son through the TV remote.

## 2017-01-11 NOTE — ED Notes (Signed)
Patient had her port accessed earlier, but insisted on having it removed.  Patient is refusing for nurse to access port again and is a difficult stick so IV team order placed.

## 2017-01-11 NOTE — BH Assessment (Signed)
[facility] Assessment Progress Note   Case was staffed with Lord DNP who recommended an inpatient admission as appropriate bed placement is investigated.     

## 2017-01-11 NOTE — ED Provider Notes (Addendum)
[place] DEPT Provider Note   CSN: [id number] Arrival date & time: 01/11/17  [id number]     History   Chief Complaint Chief Complaint  Patient presents with  . Weakness  . Altered Mental Status    HPI Kenra Esselman is a 56 y.o. female.  Patient with hx breast cancer, hx bipolar disorder, c/o feeling depressed, and states at times feels is forgetful.  She indicates she feels symptoms are caused by stress.  States has recently established pcp follow up, which is a good thing. She indicates is also in transitional housing, and is followed by social work.  She does indicate she has a mental health provider with whom she plans to follow up closely.  Patient is having suicidal thoughts, but denies specific plan or attempt at self harm. States is eating and drinking, but hasnt eaten today. Denies headache. No vomiting. No numbness/weakness or loss of normal functional ability.    The history is provided by the patient.  Weakness  Associated symptoms include altered mental status. Pertinent negatives include no shortness of breath, no chest pain, no vomiting and no headaches.  Altered Mental Status   Associated symptoms include weakness.    Past Medical History:  Diagnosis Date  . Alcohol abuse   . Anemia    during chemo  . Anxiety    At age 63  . Arthritis Dx 2010  . Bipolar disorder ([place])   . Cancer ([place])    breast mets to brain  . Chronic pain   . Complication of anesthesia   . Depression   . Fibromyalgia Dx 2005  . GERD (gastroesophageal reflux disease)   . Headache    hx  migraines  . Opiate dependence ([place])   . PONV (postoperative nausea and vomiting)   . Port-a-cath in place   . PTSD (post-traumatic stress disorder)     Patient Active Problem List   Diagnosis Date Noted  . Gait abnormality 01/06/2017  . Bipolar I disorder, most recent episode depressed ([place]) 12/08/2016  . Adjustment disorder with anxiety 12/06/2016  . Delirium due to another medical condition  12/03/2016  . Acute encephalopathy 12/03/2016  . Diarrhea 12/03/2016  . Metastatic breast cancer ([place])   . Cerumen impaction 11/25/2016  . Otitis media 11/06/2016  . Brain metastasis ([place]) 07/27/2016  . Iron deficiency anemia 06/26/2016  . Bone metastases ([place]) 06/03/2016  . Primary cancer of lower-inner quadrant of left female breast ([place]) 06/01/2016  . Pap smear for cervical cancer screening 03/28/2015  . Current smoker 03/28/2015  . Healthcare maintenance 03/28/2015  . Seasonal allergies 03/28/2015  . Anxiety state 02/28/2015  . Fibromyalgia 02/28/2015  . Family history of diabetes mellitus 02/28/2015  . H/O alcohol abuse     Past Surgical History:  Procedure Laterality Date  . APPLICATION OF CRANIAL NAVIGATION N/A 08/14/2016   Procedure: APPLICATION OF CRANIAL NAVIGATION;  Surgeon: Erline Levine, MD;  Location: [place] [facility];  Service: Neurosurgery;  Laterality: N/A;  . BREAST RECONSTRUCTION Left    with silicone implant  . CRANIOTOMY N/A 08/14/2016   Procedure: CRANIOTOMY TUMOR EXCISION WITH Lucky Rathke;  Surgeon: Erline Levine, MD;  Location: [place] [facility];  Service: Neurosurgery;  Laterality: N/A;  . FIBULA FRACTURE SURGERY Left   . MASTECTOMY Left   . RADIOLOGY WITH ANESTHESIA N/A 07/23/2016   Procedure: MRI OF BRAIN WITH AND WITHOUT;  Surgeon: Medication Radiologist, MD;  Location: [facility];  Service: Radiology;  Laterality: N/A;  . RADIOLOGY WITH ANESTHESIA N/A 09/08/2016  Procedure: MRI OF BRAIN WITH AND WITHOUT CONTRAST;  Surgeon: Medication Radiologist, MD;  Location: [facility];  Service: Radiology;  Laterality: N/A;  . RADIOLOGY WITH ANESTHESIA N/A 12/10/2016   Procedure: MRI OF BRAIN WITH AND WITHOUT;  Surgeon: Medication Radiologist, MD;  Location: [facility];  Service: Radiology;  Laterality: N/A;  . right power port placement Right     OB History    No data available       Home Medications    Prior to Admission medications   Medication Sig Start Date End Date Taking?  Authorizing Provider  ALPRAZolam Duanne Moron) 1 MG tablet Take 1 tablet (1 mg total) by mouth at bedtime. 1/2 - 1 po qhs prn 12/24/16   Chauncey Cruel, MD  anastrozole (ARIMIDEX) 1 MG tablet Take 1 tablet (1 mg total) by mouth daily. 08/25/16   Chauncey Cruel, MD  BIOTIN PO Take 1 capsule by mouth daily.    Historical Provider, MD  cetirizine (ZYRTEC) 10 MG tablet Take 1 tablet (10 mg total) by mouth daily. 07/14/16   Chauncey Cruel, MD  diphenoxylate-atropine (LOMOTIL) 2.5-0.025 MG tablet Take 1 tablet by mouth 4 (four) times daily as needed for diarrhea or loose stools. 01/06/17   Arnoldo Morale, MD  fluticasone (FLONASE) 50 MCG/ACT nasal spray Place 2 sprays into both nostrils daily. 10/27/16   Chauncey Cruel, MD  gabapentin (NEURONTIN) 300 MG capsule Take 2 capsules (600 mg total) by mouth 3 (three) times daily. 300 mg at night for one day, twice daily for one day, then three times daily 11/11/16   Chauncey Cruel, MD  ibuprofen (ADVIL,MOTRIN) 200 MG tablet Take 800 mg by mouth every 6 (six) hours as needed for mild pain.    Historical Provider, MD  lamoTRIgine (LAMICTAL) 100 MG tablet Take 1.5 tablets (150 mg total) by mouth 2 (two) times daily. 11/11/16   Chauncey Cruel, MD  lidocaine-prilocaine (EMLA) cream Apply 1 application topically as needed. Patient not taking: Reported on 01/06/2017 12/24/16   Chauncey Cruel, MD  lithium carbonate 300 MG capsule TAKE 1 CAPSULE BY MOUTH TWICE DAILY WITH A MEAL 12/24/16   Chauncey Cruel, MD  MULTIPLE VITAMIN PO Take by mouth every morning.    Historical Provider, MD  ondansetron (ZOFRAN) 8 MG tablet Take 1 tablet (8 mg total) by mouth every 8 (eight) hours as needed for nausea or vomiting. 12/10/16   Ripudeep Krystal Eaton, MD  pantoprazole (PROTONIX) 40 MG tablet TAKE 1 TABLET EVERY DAY 12/14/16   Chauncey Cruel, MD  potassium chloride (MICRO-K) 10 MEQ CR capsule Take 1 capsule (10 mEq total) by mouth daily. 01/06/17   Arnoldo Morale, MD  traMADol (ULTRAM)  50 MG tablet Take 1 tablet (50 mg total) by mouth every 6 (six) hours as needed. 12/24/16   Chauncey Cruel, MD  traZODone (DESYREL) 150 MG tablet TAKE 2 TABLETS BY MOUTH AT BEDTIME. Patient taking differently: TAKE 300 MG BY MOUTH AT BEDTIME 11/16/16   Chauncey Cruel, MD  varenicline (CHANTIX) 0.5 MG tablet Take 1 tablet (0.5 mg total) by mouth daily. 11/11/16   Chauncey Cruel, MD  Vitamin D, Cholecalciferol, 1000 units CAPS Take 1,000 Units by mouth daily.    Historical Provider, MD    Family History Family History  Problem Relation Age of Onset  . Diabetes Mother   . Bipolar disorder Mother   . CAD Father     Social History Social History  Substance Use  Topics  . Smoking status: Current Every Day Smoker    Packs/day: 0.50    Types: Cigarettes  . Smokeless tobacco: Never Used     Comment: Pt is on Chantix at present time  . Alcohol use No     Comment: no ETOH since 08/22/12     Allergies   Demerol and Erythromycin   Review of Systems Review of Systems  Constitutional: Negative for chills and fever.  HENT: Negative for sore throat.   Eyes: Negative for visual disturbance.  Respiratory: Negative for shortness of breath.   Cardiovascular: Negative for chest pain.  Gastrointestinal: Negative for abdominal pain, diarrhea and vomiting.  Genitourinary: Negative for flank pain.  Musculoskeletal: Negative for back pain and neck pain.  Skin: Negative for rash.  Neurological: Positive for weakness. Negative for headaches.  Hematological: Does not bruise/bleed easily.  Psychiatric/Behavioral: Positive for dysphoric mood.     Physical Exam Updated Vital Signs BP 109/63   Pulse 98   Temp 98.6 F (37 C) (Oral)   Resp 20   SpO2 100%   Physical Exam  Constitutional: She appears well-developed and well-nourished. No distress.  HENT:  Head: Atraumatic.  Mouth/Throat: Oropharynx is clear and moist.  Eyes: Conjunctivae and EOM are normal. Pupils are equal, round, and  reactive to light. No scleral icterus.  Neck: Neck supple. No tracheal deviation present. No thyromegaly present.  Cardiovascular: Normal rate, regular rhythm, normal heart sounds and intact distal pulses.   Pulmonary/Chest: Effort normal and breath sounds normal. No respiratory distress.  Abdominal: Soft. Normal appearance and bowel sounds are normal. She exhibits no distension. There is no tenderness.  Genitourinary:  Genitourinary Comments: No cva tenderness  Musculoskeletal: She exhibits no edema.  Neurological: She is alert.  Speech clear/fluent.  Motor intact bil, stre 5/5. Ambulates w steady gait.  Skin: Skin is warm and dry. No rash noted.  Psychiatric:  Depressed mood. Tearful.  Nursing note and vitals reviewed.    ED Treatments / Results  Labs (all labs ordered are listed, but only abnormal results are displayed) Results for orders placed or performed during the hospital encounter of 01/11/17  CBC  Result Value Ref Range   WBC 6.3 4.0 - 10.5 K/uL   RBC 4.20 3.87 - 5.11 MIL/uL   Hemoglobin 13.0 12.0 - 15.0 g/dL   HCT 39.8 36.0 - 46.0 %   MCV 94.8 78.0 - 100.0 fL   MCH 31.0 26.0 - 34.0 pg   MCHC 32.7 30.0 - 36.0 g/dL   RDW 12.4 11.5 - 15.5 %   Platelets 211 150 - 400 K/uL  Comprehensive metabolic panel  Result Value Ref Range   Sodium 137 135 - 145 mmol/L   Potassium 3.7 3.5 - 5.1 mmol/L   Chloride 106 101 - 111 mmol/L   CO2 25 22 - 32 mmol/L   Glucose, Bld 89 65 - 99 mg/dL   BUN 10 6 - 20 mg/dL   Creatinine, Ser 0.72 0.44 - 1.00 mg/dL   Calcium 9.3 8.9 - 10.3 mg/dL   Total Protein 6.0 (L) 6.5 - 8.1 g/dL   Albumin 3.8 3.5 - 5.0 g/dL   AST 19 15 - 41 U/L   ALT 18 14 - 54 U/L   Alkaline Phosphatase 34 (L) 38 - 126 U/L   Total Bilirubin 0.5 0.3 - 1.2 mg/dL   GFR calc non Af Amer >60 >60 mL/min   GFR calc Af Amer >60 >60 mL/min   Anion gap 6 5 -  15  Ethanol  Result Value Ref Range   Alcohol, Ethyl (B) <5 <5 mg/dL  Rapid urine drug screen (hospital performed)    Result Value Ref Range   Opiates NONE DETECTED NONE DETECTED   Cocaine NONE DETECTED NONE DETECTED   Benzodiazepines POSITIVE (A) NONE DETECTED   Amphetamines NONE DETECTED NONE DETECTED   Tetrahydrocannabinol NONE DETECTED NONE DETECTED   Barbiturates NONE DETECTED NONE DETECTED  Lithium level  Result Value Ref Range   Lithium Lvl 1.44 (H) 0.60 - 1.20 mmol/L  CBG monitoring, ED  Result Value Ref Range   Glucose-Capillary 116 (H) 65 - 99 mg/dL    EKG  EKG Interpretation None       Radiology No results found.  Procedures Procedures (including critical care time)  Medications Ordered in ED Medications - No data to display   Initial Impression / Assessment and Plan / ED Course  I have reviewed the triage vital signs and the nursing notes.  Pertinent labs & imaging results that were available during my care of the patient were reviewed by me and considered in my medical decision making (see chart for details).  Labs sent.  Reviewed nursing notes and prior charts for additional history.   Patient with several recent ED and pcp visits.  Pt with recent brain imaging negative for acute process, or recurrence of mets.    Given increased depression, tearfulness, will consult psychiatry team.   Lithium level mildly high, renal fxn normal, pt denies overdose or overuse - iv ns. Will plan to recheck in 6 hours.   [facility] team evaluating and is recommending they hold for repeat psych eval.  1713, signed out to Dr Tamera Punt to check repeat Li++ when resulted and address appropriately.      Final Clinical Impressions(s) / ED Diagnoses   Final diagnoses:  None    New Prescriptions New Prescriptions   No medications on file         Lajean Saver, MD 01/11/17 1713

## 2017-01-11 NOTE — ED Notes (Signed)
Pt walking out of room naked, redirected back to room. Pt refused gown, dressed into her home clothes.

## 2017-01-11 NOTE — BH Assessment (Addendum)
Assessment Note  [name] is an 56 y.o. female that presents this date. Patient is very liable and irritable. Patient becomes upset with this Probation officer and refuses to answer questions. Patient is very tearful and starts crying as she interacts with this Probation officer. Patient is oriented to place but states "I know where I am do you." Information for the purposes of assessment was gathered from previous notes and collateral. Per admission note: "EMS pt c/o for progressive generalized weakness, some difficulty speaking sensibly. Seen two days ago for same, but symptoms worsening. No acute new changes. [place] a few days ago, ecchymosis to right back over lower rib cage history of breast cancer with mets to brain. Multiple MRIs in past, MRI scheduled for Monday. Ambulatory. Pt;s behavior is getting aggressive. Is not cooperating with taking vitals or getting urine sample. Pt said she wants something to eat now. Pt has stated that her and her family would like for her to stay over night to be evaluated. A & O x 4 per standard questions but tried to talk to her son through the TV remote." Per notes  patient has a medical history of metastatic breast cancer to bone and brain, depression, fibromyalgia, remote alcohol abuse, bipolar disorder, and chronic pain syndrome. Per notes patient's POA is her son [name] [phone] who could not be reached at this time. Patient did state she had been receiving services from [facility] although was unable to identify what services she was receiving. Patient was last seen at [facility] on 12/15/16 for symptoms associated with being Bipolar. Patient per notes is currently in recovery. Patient is a limited history and yells at this writer to leave her room. Patient denies any S/i, H/I or AVH although patient stated "no, no no" to all questions. Case was staffed with Reita Cliche DNP who recommended an inpatient admission as appropriate bed placement is investigated.      Diagnosis:  Bipolar (per notes)  Past Medical History:  Past Medical History:  Diagnosis Date  . Alcohol abuse   . Anemia    during chemo  . Anxiety    At age 50  . Arthritis Dx 2010  . Bipolar disorder ([place])   . Cancer ([place])    breast mets to brain  . Chronic pain   . Complication of anesthesia   . Depression   . Fibromyalgia Dx 2005  . GERD (gastroesophageal reflux disease)   . Headache    hx  migraines  . Opiate dependence ([place])   . PONV (postoperative nausea and vomiting)   . Port-a-cath in place   . PTSD (post-traumatic stress disorder)     Past Surgical History:  Procedure Laterality Date  . APPLICATION OF CRANIAL NAVIGATION N/A 08/14/2016   Procedure: APPLICATION OF CRANIAL NAVIGATION;  Surgeon: Erline Levine, MD;  Location: [place] NEURO ORS;  Service: Neurosurgery;  Laterality: N/A;  . BREAST RECONSTRUCTION Left    with silicone implant  . CRANIOTOMY N/A 08/14/2016   Procedure: CRANIOTOMY TUMOR EXCISION WITH Lucky Rathke;  Surgeon: Erline Levine, MD;  Location: [place] NEURO ORS;  Service: Neurosurgery;  Laterality: N/A;  . FIBULA FRACTURE SURGERY Left   . MASTECTOMY Left   . RADIOLOGY WITH ANESTHESIA N/A 07/23/2016   Procedure: MRI OF BRAIN WITH AND WITHOUT;  Surgeon: Medication Radiologist, MD;  Location: [place];  Service: Radiology;  Laterality: N/A;  . RADIOLOGY WITH ANESTHESIA N/A 09/08/2016   Procedure: MRI OF BRAIN WITH AND WITHOUT CONTRAST;  Surgeon: Medication Radiologist, MD;  Location: [facility];  Service: Radiology;  Laterality: N/A;  . RADIOLOGY WITH ANESTHESIA N/A 12/10/2016   Procedure: MRI OF BRAIN WITH AND WITHOUT;  Surgeon: Medication Radiologist, MD;  Location: [place];  Service: Radiology;  Laterality: N/A;  . right power port placement Right     Family History:  Family History  Problem Relation Age of Onset  . Diabetes Mother   . Bipolar disorder Mother   . CAD Father     Social History:  reports that she has been smoking Cigarettes.  She has been smoking about 0.50 packs  per day. She has never used smokeless tobacco. She reports that she does not drink alcohol or use drugs.  Additional Social History:  Alcohol / Drug Use Pain Medications: pt denies abuse - see pta meds list Prescriptions: pt denies abuse - see pta meds list Over the Counter: pt denies abuse - see pta meds list History of alcohol / drug use?: No history of alcohol / drug abuse (Currently in recovery for 5 years) Longest period of sobriety (when/how long): Current Negative Consequences of Use:  (Denies) Withdrawal Symptoms:  (Denies)  CIWA: CIWA-Ar BP: (!) 118/102 Pulse Rate: 86 COWS:    Allergies:  Allergies  Allergen Reactions  . Demerol Itching and Nausea And Vomiting  . Erythromycin Rash    Home Medications:  (Not in a hospital admission)  OB/GYN Status:  No LMP recorded. Patient has had an ablation.  General Assessment Data Location of Assessment: [facility] TTS Assessment: In system Is this a Tele or Face-to-Face Assessment?: Face-to-Face Is this an Initial Assessment or a Re-assessment for this encounter?: Initial Assessment Marital status: Divorced [place] name: na Is patient pregnant?: No Pregnancy Status: No Living Arrangements: Group Home Can pt return to current living arrangement?: Yes Admission Status: Voluntary Is patient capable of signing voluntary admission?: Yes Referral Source: Self/Family/Friend Insurance type: Medicaid  Medical Screening Exam ([place]) Medical Exam completed: Yes  Crisis Care Plan Living Arrangements: Group Home Legal Guardian:  (na) Name of Psychiatrist: None Name of Therapist: None  Education Status Is patient currently in school?: No Current Grade:  (na) Highest grade of school patient has completed:  (12) Name of school: na Contact person: na  Risk to self with the past 6 months Suicidal Ideation: No Has patient been a risk to self within the past 6 months prior to admission? : No Suicidal Intent: No Has  patient had any suicidal intent within the past 6 months prior to admission? : No Is patient at risk for suicide?: No Suicidal Plan?: No Has patient had any suicidal plan within the past 6 months prior to admission? : No Access to Means: No What has been your use of drugs/alcohol within the last 12 months?: Denies pt is currently in recovery Previous Attempts/Gestures: No How many times?: 0 Other Self Harm Risks: na Triggers for Past Attempts: Unknown Intentional Self Injurious Behavior: None Family Suicide History: No Recent stressful life event(s): Other (Comment) (Unknown) Persecutory voices/beliefs?: No Depression: Yes Depression Symptoms: Fatigue, Guilt Substance abuse history and/or treatment for substance abuse?: Yes Suicide prevention information given to non-admitted patients: Not applicable  Risk to Others within the past 6 months Homicidal Ideation: No Does patient have any lifetime risk of violence toward others beyond the six months prior to admission? : No Thoughts of Harm to Others: No Current Homicidal Intent: No Current Homicidal Plan: No Access to Homicidal Means: No Identified Victim: na History of harm to others?: No  Assessment of Violence: None Noted Violent Behavior Description: na Does patient have access to weapons?: No Criminal Charges Pending?: No Does patient have a court date: No Is patient on probation?: No  Psychosis Hallucinations: None noted Delusions: None noted  Mental Status Report Appearance/Hygiene: In scrubs Eye Contact: Poor Motor Activity: Agitation Speech: Abusive Level of Consciousness: Combative Mood: Anxious, Labile Affect: Angry, Anxious Anxiety Level: Moderate Thought Processes: Flight of Ideas Judgement: Impaired Orientation: Place Obsessive Compulsive Thoughts/Behaviors: None  Cognitive Functioning Concentration: Decreased Memory: Unable to Assess IQ: Average Insight: Poor Impulse Control: Fair Appetite:   (UTA) Weight Loss:  (UTA) Weight Gain:  (UTA) Sleep:  (UTA) Total Hours of Sleep:  (UTA) Vegetative Symptoms:  (UTA)  ADLScreening [facility] Assessment Services) Patient's cognitive ability adequate to safely complete daily activities?: Yes Patient able to express need for assistance with ADLs?: Yes Independently performs ADLs?: Yes (appropriate for developmental age)  Prior Inpatient Therapy Prior Inpatient Therapy: Yes Prior Therapy Dates: 2017 Prior Therapy Facilty/Provider[facility] Reason for Treatment: MH issues  Prior Outpatient Therapy Prior Outpatient Therapy: Yes Prior Therapy Dates: 2017 Prior Therapy Facilty/Provider(s): [facility] Reason for Treatment: MH issues Does patient have an ACCT team?: No Does patient have Intensive In-House Services?  : No Does patient have [facility] services? : No Does patient have P4CC services?: No  ADL Screening (condition at time of admission) Patient's cognitive ability adequate to safely complete daily activities?: Yes Is the patient deaf or have difficulty hearing?: No Does the patient have difficulty seeing, even when wearing glasses/contacts?: No Does the patient have difficulty concentrating, remembering, or making decisions?: No Patient able to express need for assistance with ADLs?: Yes Does the patient have difficulty dressing or bathing?: No Independently performs ADLs?: Yes (appropriate for developmental age) Does the patient have difficulty walking or climbing stairs?: No Weakness of Legs: None Weakness of Arms/Hands: None  Home Assistive Devices/Equipment Home Assistive Devices/Equipment: None  Therapy Consults (therapy consults require a physician order) PT Evaluation Needed: No OT Evalulation Needed: No SLP Evaluation Needed: No Abuse/Neglect Assessment (Assessment to be complete while patient is alone) Physical Abuse: Denies Verbal Abuse: Denies Sexual Abuse: Denies Exploitation of patient/patient's  resources: Denies Self-Neglect: Denies Values / Beliefs Cultural Requests During Hospitalization: None Spiritual Requests During Hospitalization: None Consults Spiritual Care Consult Needed: No Social Work Consult Needed: No Regulatory affairs officer (For Healthcare) Does Patient Have a Medical Advance Directive?: No Would patient like information on creating a medical advance directive?: No - Patient declined    Additional Information 1:1 In Past 12 Months?: No CIRT Risk: No Elopement Risk: No Does patient have medical clearance?: Yes     Disposition: Case was staffed with Reita Cliche DNP who recommended an inpatient admission as appropriate bed placement is investigated.      Disposition Initial Assessment Completed for this Encounter: Yes Disposition of Patient: Other dispositions Other disposition(s): Other (Comment) (pt to be re-evaluated on the a.m.)  On Site Evaluation by:   Reviewed with Physician:    Mamie Nick 01/11/2017 1:45 PM

## 2017-01-11 NOTE — ED Notes (Signed)
Patient tearful, confused.  Dialed phone for patient to talk to son but had to leave message.  Sister answered but was at work and did not talk to patient very long.  Patient says,"she always says she is coming to see me but then she doesn't."  Patient told nurse she lives alone and has no help.  Patient denies pain at this time.

## 2017-01-12 DIAGNOSIS — Z818 Family history of other mental and behavioral disorders: Secondary | ICD-10-CM

## 2017-01-12 DIAGNOSIS — Z888 Allergy status to other drugs, medicaments and biological substances status: Secondary | ICD-10-CM

## 2017-01-12 DIAGNOSIS — Z79899 Other long term (current) drug therapy: Secondary | ICD-10-CM

## 2017-01-12 DIAGNOSIS — F17210 Nicotine dependence, cigarettes, uncomplicated: Secondary | ICD-10-CM

## 2017-01-12 DIAGNOSIS — G4700 Insomnia, unspecified: Secondary | ICD-10-CM

## 2017-01-12 DIAGNOSIS — Z8249 Family history of ischemic heart disease and other diseases of the circulatory system: Secondary | ICD-10-CM

## 2017-01-12 DIAGNOSIS — Z833 Family history of diabetes mellitus: Secondary | ICD-10-CM

## 2017-01-12 DIAGNOSIS — Z9889 Other specified postprocedural states: Secondary | ICD-10-CM

## 2017-01-12 DIAGNOSIS — F3130 Bipolar disorder, current episode depressed, mild or moderate severity, unspecified: Secondary | ICD-10-CM | POA: Diagnosis not present

## 2017-01-12 LAB — URINALYSIS, ROUTINE W REFLEX MICROSCOPIC
Bacteria, UA: NONE SEEN
Bilirubin Urine: NEGATIVE
Glucose, UA: NEGATIVE mg/dL
Hgb urine dipstick: NEGATIVE
Ketones, ur: 20 mg/dL — AB
Nitrite: NEGATIVE
Protein, ur: NEGATIVE mg/dL
Specific Gravity, Urine: 1.019 (ref 1.005–1.030)
pH: 6 (ref 5.0–8.0)

## 2017-01-12 LAB — INFLUENZA PANEL BY PCR (TYPE A & B)
Influenza A By PCR: NEGATIVE
Influenza B By PCR: NEGATIVE

## 2017-01-12 MED ORDER — LURASIDONE HCL 40 MG PO TABS: 40.0000 mg | ORAL_TABLET | Freq: Every day | ORAL | Status: DC

## 2017-01-12 MED ORDER — HEPARIN SOD (PORK) LOCK FLUSH 100 UNIT/ML IV SOLN
500.0000 [IU] | Freq: Once | INTRAVENOUS | Status: AC
  Administered 2017-01-12: 500 [IU]
  Filled 2017-01-12 (×2): qty 5

## 2017-01-12 NOTE — BH Assessment (Signed)
[place] Assessment Progress Note  Per Corena Pilgrim, MD, this pt requires psychiatric hospitalization at this time.  Pt presents under IVC initiated by EDP Lajean Saver, MD.  At 13:19 Gerald Stabs calls from [facility] to report that pt has been accepted to their facility by [facility] [place] for Dr Elnora Morrison.  Pt must arrive before 23:00 today, or after 06:00 tomorrow.  Waylan Boga, DNP concurs with this decision.  Pt's nurse has been notified, and agrees to call report to [phone].  Pt is to be transported via [facility].  Jalene Mullet, [place] Triage Specialist [phone]

## 2017-01-12 NOTE — BH Assessment (Signed)
Patient has been referred to the following facilities;   [facility]  Shatika Grinnell, [place] Therapeutic Triage Specialist [place] 01/12/2017 2:08 AM

## 2017-01-12 NOTE — Discharge Instructions (Signed)
Transfer to [facility] psychiatric unit.

## 2017-01-12 NOTE — ED Provider Notes (Signed)
[place] team indicates pt accepted at [facility], Dr Caren Hazy accepting MD.   Pt alert, ambulatory about ED.  Vitals:   01/12/17 0039 01/12/17 0618  BP: 124/68 118/57  Pulse: 91 96  Resp: 18 16  Temp: 97.7 F (36.5 C) 98.9 F (37.2 C)   Patient appears stable for transfer.      Lajean Saver, MD 01/12/17 1415

## 2017-01-12 NOTE — Consult Note (Signed)
[facility] Consult   Reason for Consult:  Depressed, anxious, forgetful Referring Physician:  EDP Patient Identification: [name] MRN:  [id number] Principal Diagnosis: Bipolar I disorder, most recent episode depressed ([place]) Diagnosis:   Patient Active Problem List   Diagnosis Date Noted  . Bipolar I disorder, most recent episode depressed ([place]) [F31.30] 12/08/2016    Priority: High  . Gait abnormality [R26.9] 01/06/2017  . Adjustment disorder with anxiety [F43.22] 12/06/2016  . Delirium due to another medical condition [F05] 12/03/2016  . Acute encephalopathy [G93.40] 12/03/2016  . Diarrhea [R19.7] 12/03/2016  . Metastatic breast cancer ([place]) [C50.919]   . Cerumen impaction [H61.20] 11/25/2016  . Otitis media [H66.90] 11/06/2016  . Brain metastasis ([place]) [C79.31] 07/27/2016  . Iron deficiency anemia [D50.9] 06/26/2016  . Bone metastases ([place]) [C79.51] 06/03/2016  . Primary cancer of lower-inner quadrant of left female breast ([place]) [C50.312] 06/01/2016  . Pap smear for cervical cancer screening [Z12.4] 03/28/2015  . Current smoker [F17.200] 03/28/2015  . Healthcare maintenance [Z00.00] 03/28/2015  . Seasonal allergies [J30.2] 03/28/2015  . Anxiety state [F41.1] 02/28/2015  . Fibromyalgia [M79.7] 02/28/2015  . Family history of diabetes mellitus [Z83.3] 02/28/2015  . H/O alcohol abuse [Z87.898]     Total Time spent with patient: 45 minutes  Subjective:   [name] is a 56 y.o. female patient admitted with worsening depression.  HPI:  Patient with history of Bipolar depression and breast cancer who present to [facility] reporting that she has been experiencing worsening depression, hopelessness and forgetfulness. She reports that she has been experiencing a lot of stress lately and having overwhelming feeling about her life.Patient reports lots of stressor in her life: was seeing a psychiatrist who retired and the one who took over the practise does not take her  health insurance and also in the process of transitioning to a new housing. Patient reports passive suicidal thoughts with no plan, denies delusions, psychosis or drug use.  Past Psychiatric History: as above  Risk to Self: Suicidal Ideation: No Suicidal Intent: No Is patient at risk for suicide?: No Suicidal Plan?: No Access to Means: No What has been your use of drugs/alcohol within the last 12 months?: Denies pt is currently in recovery How many times?: 0 Other Self Harm Risks: na Triggers for Past Attempts: Unknown Intentional Self Injurious Behavior: None Risk to Others: Homicidal Ideation: No Thoughts of Harm to Others: No Current Homicidal Intent: No Current Homicidal Plan: No Access to Homicidal Means: No Identified Victim: na History of harm to others?: No Assessment of Violence: None Noted Violent Behavior Description: na Does patient have access to weapons?: No Criminal Charges Pending?: No Does patient have a court date: No Prior Inpatient Therapy: Prior Inpatient Therapy: Yes Prior Therapy Dates: 2017 Prior Therapy Facilty/Provider(s): [facility] Reason for Treatment: MH issues Prior Outpatient Therapy: Prior Outpatient Therapy: Yes Prior Therapy Dates: 2017 Prior Therapy Facilty/Provider(s): [facility] Reason for Treatment: MH issues Does patient have an ACCT team?: No Does patient have Intensive In-House Services?  : No Does patient have [facility] services? : No Does patient have P4CC services?: No  Past Medical History:  Past Medical History:  Diagnosis Date  . Alcohol abuse   . Anemia    during chemo  . Anxiety    At age 57  . Arthritis Dx 2010  . Bipolar disorder ([place])   . Cancer ([place])    breast mets to brain  . Chronic pain   . Complication of anesthesia   .  Depression   . Fibromyalgia Dx 2005  . GERD (gastroesophageal reflux disease)   . Headache    hx  migraines  . Opiate dependence ([place])   . PONV (postoperative nausea and vomiting)    . Port-a-cath in place   . PTSD (post-traumatic stress disorder)     Past Surgical History:  Procedure Laterality Date  . APPLICATION OF CRANIAL NAVIGATION N/A 08/14/2016   Procedure: APPLICATION OF CRANIAL NAVIGATION;  Surgeon: Erline Levine, MD;  Location: [place] NEURO ORS;  Service: Neurosurgery;  Laterality: N/A;  . BREAST RECONSTRUCTION Left    with silicone implant  . CRANIOTOMY N/A 08/14/2016   Procedure: CRANIOTOMY TUMOR EXCISION WITH Lucky Rathke;  Surgeon: Erline Levine, MD;  Location: [place] NEUR[facility] ORS;  Service: Neurosurgery;  Laterality: N/A;  . FIBULA FRACTURE SURGERY Left   . MASTECTOMY Left   . RADIOLOGY WITH ANESTHESIA N/A 07/23/2016   Procedure: MRI OF BRAIN WITH AND WITHOUT;  Surgeon: Medication Radiologist, MD;  Location: [facility];  Service: Radiology;  Laterality: N/A;  . RADIOLOGY WITH ANESTHESIA N/A 09/08/2016   Procedure: MRI OF BRAIN WITH AND WITHOUT CONTRAST;  Surgeon: Medication Radiologist, MD;  Location: [facility];  Service: Radiology;  Laterality: N/A;  . RADIOLOGY WITH ANESTHESIA N/A 12/10/2016   Procedure: MRI OF BRAIN WITH AND WITHOUT;  Surgeon: Medication Radiologist, MD;  Location: [facility];  Service: Radiology;  Laterality: N/A;  . right power port placement Right    Family History:  Family History  Problem Relation Age of Onset  . Diabetes Mother   . Bipolar disorder Mother   . CAD Father    Family Psychiatric  History:  Social History:  History  Alcohol Use No    Comment: no ETOH since 08/22/12     History  Drug Use No    Comment: prescription opiates from the street daily until Sunday    Social History   Social History  . Marital status: Single    Spouse name: N/A  . Number of children: N/A  . Years of education: N/A   Social History Main Topics  . Smoking status: Current Every Day Smoker    Packs/day: 0.50    Types: Cigarettes  . Smokeless tobacco: Never Used     Comment: Pt is on Chantix at present time  . Alcohol use No     Comment: no ETOH since  08/22/12  . Drug use: No     Comment: prescription opiates from the street daily until Sunday  . Sexual activity: No     Comment: ablation   Other Topics Concern  . None   Social History Narrative  . None   Additional Social History:    Allergies:   Allergies  Allergen Reactions  . Demerol Itching and Nausea And Vomiting  . Erythromycin Rash    Labs:  Results for orders placed or performed during the hospital encounter of 01/11/17 (from the past 48 hour(s))  CBG monitoring, ED     Status: Abnormal   Collection Time: 01/11/17 10:31 AM  Result Value Ref Range   Glucose-Capillary 116 (H) 65 - 99 mg/dL  CBC     Status: None   Collection Time: 01/11/17 11:21 AM  Result Value Ref Range   WBC 6.3 4.0 - 10.5 K/uL   RBC 4.20 3.87 - 5.11 MIL/uL   Hemoglobin 13.0 12.0 - 15.0 g/dL   HCT 39.8 36.0 - 46.0 %   MCV 94.8 78.0 - 100.0 fL   MCH 31.0 26.0 - 34.0  pg   MCHC 32.7 30.0 - 36.0 g/dL   RDW 12.4 11.5 - 15.5 %   Platelets 211 150 - 400 K/uL  Comprehensive metabolic panel     Status: Abnormal   Collection Time: 01/11/17 11:21 AM  Result Value Ref Range   Sodium 137 135 - 145 mmol/L   Potassium 3.7 3.5 - 5.1 mmol/L   Chloride 106 101 - 111 mmol/L   CO2 25 22 - 32 mmol/L   Glucose, Bld 89 65 - 99 mg/dL   BUN 10 6 - 20 mg/dL   Creatinine, Ser 0.72 0.44 - 1.00 mg/dL   Calcium 9.3 8.9 - 10.3 mg/dL   Total Protein 6.0 (L) 6.5 - 8.1 g/dL   Albumin 3.8 3.5 - 5.0 g/dL   AST 19 15 - 41 U/L   ALT 18 14 - 54 U/L   Alkaline Phosphatase 34 (L) 38 - 126 U/L   Total Bilirubin 0.5 0.3 - 1.2 mg/dL   GFR calc non Af Amer >60 >60 mL/min   GFR calc Af Amer >60 >60 mL/min    Comment: (NOTE) The eGFR has been calculated using the CKD EPI equation. This calculation has not been validated in all clinical situations. eGFR's persistently <60 mL/min signify possible Chronic Kidney Disease.    Anion gap 6 5 - 15  Ethanol     Status: None   Collection Time: 01/11/17 11:21 AM  Result Value Ref  Range   Alcohol, Ethyl (B) <5 <5 mg/dL    Comment:        LOWEST DETECTABLE LIMIT FOR SERUM ALCOHOL IS 5 mg/dL FOR MEDICAL PURPOSES ONLY   Lithium level     Status: Abnormal   Collection Time: 01/11/17 11:21 AM  Result Value Ref Range   Lithium Lvl 1.44 (H) 0.60 - 1.20 mmol/L  Acetaminophen level     Status: Abnormal   Collection Time: 01/11/17 11:21 AM  Result Value Ref Range   Acetaminophen (Tylenol), Serum <10 (L) 10 - 30 ug/mL    Comment:        THERAPEUTIC CONCENTRATIONS VARY SIGNIFICANTLY. A RANGE OF 10-30 ug/mL MAY BE AN EFFECTIVE CONCENTRATION FOR MANY PATIENTS. HOWEVER, SOME ARE BEST TREATED AT CONCENTRATIONS OUTSIDE THIS RANGE. ACETAMINOPHEN CONCENTRATIONS >150 ug/mL AT 4 HOURS AFTER INGESTION AND >50 ug/mL AT 12 HOURS AFTER INGESTION ARE OFTEN ASSOCIATED WITH TOXIC REACTIONS.   Salicylate level     Status: None   Collection Time: 01/11/17 11:21 AM  Result Value Ref Range   Salicylate Lvl <6.4 2.8 - 30.0 mg/dL  Rapid urine drug screen (hospital performed)     Status: Abnormal   Collection Time: 01/11/17  2:05 PM  Result Value Ref Range   Opiates NONE DETECTED NONE DETECTED   Cocaine NONE DETECTED NONE DETECTED   Benzodiazepines POSITIVE (A) NONE DETECTED   Amphetamines NONE DETECTED NONE DETECTED   Tetrahydrocannabinol NONE DETECTED NONE DETECTED   Barbiturates NONE DETECTED NONE DETECTED    Comment:        DRUG SCREEN FOR MEDICAL PURPOSES ONLY.  IF CONFIRMATION IS NEEDED FOR ANY PURPOSE, NOTIFY LAB WITHIN 5 DAYS.        LOWEST DETECTABLE LIMITS FOR URINE DRUG SCREEN Drug Class       Cutoff (ng/mL) Amphetamine      1000 Barbiturate      200 Benzodiazepine   332 Tricyclics       951 Opiates          300 Cocaine  300 THC              50   Lithium level     Status: None   Collection Time: 01/11/17  4:00 PM  Result Value Ref Range   Lithium Lvl 0.92 0.60 - 1.20 mmol/L    Current Facility-Administered Medications  Medication Dose Route  Frequency Provider Last Rate Last Dose  . anastrozole (ARIMIDEX) tablet 1 mg  1 mg Oral Daily Patrecia Pour, NP   1 mg at 01/12/17 0951  . cholecalciferol (VITAMIN D) tablet 1,000 Units  1,000 Units Oral Daily Patrecia Pour, NP   1,000 Units at 01/12/17 0950  . gabapentin (NEURONTIN) capsule 600 mg  600 mg Oral TID Patrecia Pour, NP   600 mg at 01/12/17 0950  . ibuprofen (ADVIL,MOTRIN) tablet 800 mg  800 mg Oral Q6H PRN Patrecia Pour, NP      . lamoTRIgine (LAMICTAL) tablet 150 mg  150 mg Oral BID Patrecia Pour, NP   150 mg at 01/12/17 0951  . lidocaine-prilocaine (EMLA) cream 1 application  1 application Topical PRN Patrecia Pour, NP      . loratadine (CLARITIN) tablet 10 mg  10 mg Oral Daily Patrecia Pour, NP   10 mg at 01/12/17 0949  . lurasidone (LATUDA) tablet 40 mg  40 mg Oral QHS Mclane Arora, MD      . multivitamin with minerals tablet 1 tablet  1 tablet Oral Daily Patrecia Pour, NP   1 tablet at 01/12/17 0951  . ondansetron (ZOFRAN) tablet 8 mg  8 mg Oral Q8H PRN Patrecia Pour, NP   8 mg at 01/12/17 0039  . pantoprazole (PROTONIX) EC tablet 40 mg  40 mg Oral Daily Patrecia Pour, NP   40 mg at 01/12/17 0951  . potassium chloride (K-DUR) CR tablet 10 mEq  10 mEq Oral Daily Patrecia Pour, NP   10 mEq at 01/12/17 0949  . traMADol (ULTRAM) tablet 50 mg  50 mg Oral Q6H PRN Patrecia Pour, NP      . traZODone (DESYREL) tablet 300 mg  300 mg Oral QHS Patrecia Pour, NP   300 mg at 01/12/17 0031   Current Outpatient Prescriptions  Medication Sig Dispense Refill  . anastrozole (ARIMIDEX) 1 MG tablet Take 1 tablet (1 mg total) by mouth daily. 90 tablet 1  . Biotin 1 MG CAPS Take 1 mg by mouth daily.    . cetirizine (ZYRTEC) 10 MG tablet Take 1 tablet (10 mg total) by mouth daily. 30 tablet 11  . cholecalciferol (VITAMIN D) 1000 units tablet Take 1,000 Units by mouth daily.    . diphenoxylate-atropine (LOMOTIL) 2.5-0.025 MG tablet Take 1 tablet by mouth 4 (four) times daily as needed  for diarrhea or loose stools. 40 tablet 1  . fluticasone (FLONASE) 50 MCG/ACT nasal spray Place 2 sprays into both nostrils daily as needed for rhinitis.    [name] gabapentin (NEURONTIN) 300 MG capsule Take 600 mg by mouth 3 (three) times daily.    [name] ibuprofen (ADVIL,MOTRIN) 200 MG tablet Take 800 mg by mouth every 6 (six) hours as needed for headache, mild pain or moderate pain.     [name] lamoTRIgine (LAMICTAL) 100 MG tablet Take 1.5 tablets (150 mg total) by mouth 2 (two) times daily. 60 tablet 1  . lidocaine-prilocaine (EMLA) cream Apply 1 application topically as needed (prior to accessing port).    [name] lithium carbonate 300 MG capsule  Take 300 mg by mouth 2 (two) times daily with a meal.    . Multiple Vitamin (MULTIVITAMIN WITH MINERALS) TABS tablet Take 1 tablet by mouth daily.    . ondansetron (ZOFRAN) 8 MG tablet Take 1 tablet (8 mg total) by mouth every 8 (eight) hours as needed for nausea or vomiting. 30 tablet 0  . pantoprazole (PROTONIX) 40 MG tablet Take 40 mg by mouth daily.    . potassium chloride (MICRO-K) 10 MEQ CR capsule Take 1 capsule (10 mEq total) by mouth daily. 30 capsule 1  . traMADol (ULTRAM) 50 MG tablet Take 50 mg by mouth every 6 (six) hours as needed for moderate pain.    . traZODone (DESYREL) 150 MG tablet Take 300 mg by mouth at bedtime.    . varenicline (CHANTIX) 0.5 MG tablet Take 1 tablet (0.5 mg total) by mouth daily. 30 tablet 3    Musculoskeletal: Strength & Muscle Tone: within normal limits Gait & Station: normal Patient leans: N/A  Psychiatric Specialty Exam: Physical Exam  Psychiatric: Her speech is normal. Judgment and thought content normal. Her affect is blunt. She is slowed and withdrawn. Cognition and memory are normal. She exhibits a depressed mood.    Review of Systems  Constitutional: Positive for malaise/fatigue.  HENT: Negative.   Eyes: Negative.   Respiratory: Negative.   Cardiovascular: Negative.   Gastrointestinal: Negative.   Genitourinary:  Negative.   Musculoskeletal: Negative.   Skin: Negative.   Neurological: Negative.   Endo/Heme/Allergies: Negative.   Psychiatric/Behavioral: Positive for depression.    Blood pressure 118/57, pulse 96, temperature 98.9 F (37.2 C), temperature source Oral, resp. rate 16, SpO2 96 %.There is no height or weight on file to calculate BMI.  General Appearance: Casual  Eye Contact:  Good  Speech:  Clear and Coherent  Volume:  Decreased  Mood:  Anxious, Depressed and Hopeless  Affect:  Constricted and Tearful  Thought Process:  Coherent  Orientation:  Full (Time, Place, and Person)  Thought Content:  Logical  Suicidal Thoughts:  Yes.  without intent/plan  Homicidal Thoughts:  No  Memory:  Immediate;   Fair Recent;   Fair Remote;   Good  Judgement:  Other:  marginal  Insight:  Shallow  Psychomotor Activity:  Decreased and Psychomotor Retardation  Concentration:  Concentration: Fair and Attention Span: Fair  Recall:  Good  Fund of Knowledge:  Fair  Language:  Good  Akathisia:  No  Handed:  Right  AIMS (if indicated):     Assets:  Communication Skills  ADL's:  Intact  Cognition:  WNL  Sleep:   fair     Treatment Plan Summary: Daily contact with patient to assess and evaluate symptoms and progress in treatment and Medication management  Discontinue Lithium due to worsening memory. Start Latuda 40 mg po at bedtime for Bipolar depression. Continue Lamictal 150 mg bid for bipolar Continue Trazodone 100 mg qhs for depression/Insomnia  Disposition: Recommend psychiatric Inpatient admission when medically cleared.  Corena Pilgrim, MD 01/12/2017 10:03 AM

## 2017-01-13 ENCOUNTER — Encounter (HOSPITAL_COMMUNITY): Payer: Self-pay | Admitting: Certified Registered Nurse Anesthetist

## 2017-01-13 ENCOUNTER — Telehealth: Payer: Self-pay | Admitting: *Deleted

## 2017-01-13 NOTE — Telephone Encounter (Signed)
[name] from [facility] called stating that patient was in the ED yesterday , and was in A-Flutter per EKG, she was to have  An MRI with conscious sedation tomorrow but needs to be sen by cardiolgist for clearance, gave information to [place] D[place], and thanked [name] for the call, will notify Dr. Cheron Schaumann 2:52 PM

## 2017-01-13 NOTE — Progress Notes (Deleted)
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   REVIEW OF SYSTEMS:   BREAST CANCER HISTORY: From the original intake note:  Sailor was aware of a "lemon sized lump in" her left axilla for about a year before bringing it to medical attention. By then she had developed left breast and left axillary swelling (June 2016). She presented to the local emergency room and had a chest CT scan 06/06/2015 which showed a nodule in the left breast measuring 0.9 cm and questionable left axillary adenopathy. She then proceeded to bilateral diagnostic mammography and left breast ultrasonography 06/19/2015. There were no prior films for comparison (last mammography 12 years prior).. The breast density was category C. Mammography showed in the left breast upper inner quadrant a 7 cm area including a small mass and significant pleomorphic calcifications. Ultrasonography defined the mass as measuring 1.2 cm. The left axilla appeared unremarkable. There was significant skin edema.  Biopsy of the left breast mass 06/19/2015 showed (SP [id number]) an invasive ductal carcinoma, grade 2, estrogen receptor 83% positive, progesterone receptor 26% positive, and HER-2 amplified by immunohistochemstry with a 3+ reading. The patient had biopsies of a separate area in the left breast August of the same year and this showed atypical ductal hyperplasia. (SP  [id number]).  Accordingly after appropriate discussion on 08/21/2015 the patient proceeded to left mastectomy with left axillary sentinel lymph node sampling, which, since the lymph nodes were positive, extended to the procedure to left axillary lymph node dissection. The pathology (SP [id number]) showed an invasive ductal carcinoma, grade 3, measuring in excess of 9 cm. There were also skin satellites, not contiguous with the invasive carcinoma. Margins were clear and ample. There was evidence of lymphovascular invasion. A total of 15 lymph nodes were removed, including 5 sentinel lymph nodes, all of which were positive, so that the final total was 14 out of 15 lymph nodes involved by tumor. There was evidence of extranodal extension. The final pathology was pT4b pN2a, stage IIIB  CA-27-29 and CEA 09/19/2015 were non-informative October 2016.  Unfortunately CT scans of the chest abdomen and pelvis 09/16/2015 showed bony metastases to the right scapula, left iliac crest, and also L4 and T-spine. There were questionable liver cysts which on repeat CT scan 03/02/2016 appear to be a little bit more well-defined, possibly a little larger. There were also some possible right upper lobe lung lesions.  Adjuvant treatment consisted of docetaxel, trastuzumab and pertuzumab, with the final (6th) docetaxel dose given 02/11/2016. She continues on trastuzumab and pertuzumab, with the 11th cycle given 05/05/2016. Echocardiogram 02/26/2016 showed an ejection fraction of 55%. She receives denosumab/Xgeva every 6 weeks.. She also receives radiation, started 06/09/201, to be completed 06/26/2016.  Her subsequent history is as detailed below   PAST MEDICAL HISTORY: Past Medical History:  Diagnosis Date  . Alcohol abuse   . Anemia    during chemo  . Anxiety    At age 14  . Arthritis Dx 2010  . Bipolar disorder ([facility])   .  Cancer ([facility])    breast  . Chronic pain   . Depression   . Fibromyalgia Dx 2005  . GERD  (gastroesophageal reflux disease)   . Headache    hx  migraines  . Opiate dependence ([facility])   . Port-a-cath in place   . PTSD (post-traumatic stress disorder)     PAST SURGICAL HISTORY: Past Surgical History:  Procedure Laterality Date  . APPLICATION OF CRANIAL NAVIGATION N/A 08/14/2016   Procedure: APPLICATION OF CRANIAL NAVIGATION;  Surgeon: Erline Levine, MD;  Location: [facility];  Service: Neurosurgery;  Laterality: N/A;  . BREAST RECONSTRUCTION Left    with silicone implant  . CRANIOTOMY N/A 08/14/2016   Procedure: CRANIOTOMY TUMOR EXCISION WITH Lucky Rathke;  Surgeon: Erline Levine, MD;  Location: [facility];  Service: Neurosurgery;  Laterality: N/A;  . FIBULA FRACTURE SURGERY    . MASTECTOMY Left   . RADIOLOGY WITH ANESTHESIA N/A 07/23/2016   Procedure: MRI OF BRAIN WITH AND WITHOUT;  Surgeon: Medication Radiologist, MD;  Location: [facility];  Service: Radiology;  Laterality: N/A;  . right power port placement Right     FAMILY HISTORY Family History  Problem Relation Age of Onset  . Diabetes Mother   . Bipolar disorder Mother   . CAD Father   The patient's father still living, age 59, in [place]. He had prostate cancer at some point in the past. The patient's mother died at age 91 from complications of diabetes. The patient had no brothers, 2 sisters. A paternal grandmother had lung cancer in the setting of tobacco abuse. There is no other history of cancer in the family to her knowledge  GYNECOLOGIC HISTORY:  No LMP recorded. Patient has had an ablation. Menarche approximately age 56. First live birth in 75. The patient is GX P2. She underwent endometrial ablation in 2016.  SOCIAL HISTORY:  The patient is originally from [place]. She has lived in [place] before but more recently was in [place]. She is back here because she could not afford her rent in [place]. She is living here and a temporary situation. She is divorced. HER-2 children are [name] who  lives in [place] and works as a Development worker, community, and [name] who also lives in [place] and works as a Catering manager. The patient has a grandchild, Shannon Longest, 90 years old as of July 2017, living in [place] with his mother. The patient has not established herself with a local church yet.    ADVANCED DIRECTIVES: Not in place; at the 06/03/2016 visit the patient was given the appropriate forms to complete and notarize at her discretion   HEALTH MAINTENANCE: Social History  Substance Use Topics  . Smoking status: Current Every Day Smoker    Packs/day: 0.50    Types: Cigarettes  . Smokeless tobacco: Never Used     Comment: Pt is on Chantix at present time  . Alcohol use Yes     Comment: no ETOH since 08/22/12     Colonoscopy:  PAP:  Bone density:   Allergies  Allergen Reactions  . Demerol Itching and Nausea And Vomiting  . Erythromycin Rash    Can take zpak    Current Outpatient Prescriptions  Medication Sig Dispense Refill  . cetirizine (ZYRTEC) 10 MG tablet Take 1 tablet (10 mg total) by mouth daily. 30 tablet 11  . dexamethasone (DECADRON) 4 MG tablet Take 1 tablet (4 mg total) by mouth 2 (two) times daily. 60 tablet 0  . diphenoxylate-atropine (LOMOTIL) 2.5-0.025 MG tablet Take 2 tablets  by mouth 4 (four) times daily as needed for diarrhea or loose stools. 40 tablet 0  . fluticasone (FLONASE) 50 MCG/ACT nasal spray Place 1 spray into the nose daily.    [name] gabapentin (NEURONTIN) 300 MG capsule Take 2 capsules (600 mg total) by mouth 3 (three) times daily. 300 mg at night for one day, twice daily for one day, then three times daily 180 capsule 2  . hyaluronate sodium (RADIAPLEXRX) GEL Apply 1 application topically.    [name] HYDROcodone-acetaminophen (NORCO) 7.5-325 MG tablet Take 1-2 tablets by mouth every 6 (six) hours as needed for severe pain (give one tab, then if other tab is needed, may give 30 min later.). 60 tablet 0  . ibuprofen (ADVIL,MOTRIN) 200 MG tablet Take 800 mg by mouth every 6  (six) hours as needed for mild pain.    [name] lamoTRIgine (LAMICTAL) 100 MG tablet Take 1.5 tablets (150 mg total) by mouth 2 (two) times daily. 60 tablet 3  . lidocaine-prilocaine (EMLA) cream Apply 1 application topically as needed. 30 g 0  . lithium carbonate 300 MG capsule Take 1 capsule (300 mg total) by mouth 2 (two) times daily with a meal. 60 capsule 3  . Loperamide HCl (IMODIUM PO) Take 1 tablet by mouth daily as needed (diarhea).    . LORazepam (ATIVAN) 0.5 MG tablet Take 1 tablet (0.5 mg total) by mouth every 8 (eight) hours as needed for anxiety. 30 tablet 1  . ondansetron (ZOFRAN) 8 MG tablet Take 1 tablet (8 mg total) by mouth every 8 (eight) hours as needed for nausea or vomiting. 30 tablet 1  . pantoprazole (PROTONIX) 40 MG tablet Take 1 tablet (40 mg total) by mouth daily. 30 tablet 3  . polyethylene glycol powder (GLYCOLAX/MIRALAX) powder Take 17 g by mouth 2 (two) times daily as needed for mild constipation. (Patient not taking: Reported on 08/13/2016) 3350 g 3  . potassium chloride (K-DUR) 10 MEQ tablet Take 1 tablet (10 mEq total) by mouth daily. 30 tablet 3  . prochlorperazine (COMPAZINE) 10 MG tablet Take 10 mg by mouth daily as needed for nausea.     [name] tiZANidine (ZANAFLEX) 4 MG tablet Take 1 tablet (4 mg total) by mouth daily. 30 tablet 0  . traMADol (ULTRAM) 50 MG tablet Take 1 tablet (50 mg total) by mouth every 6 (six) hours as needed. 60 tablet 0  . trazodone (DESYREL) 300 MG tablet Take 1 tablet (300 mg total) by mouth at bedtime. 30 tablet 3  . triamcinolone cream (KENALOG) 0.5 % Apply 1 application topically daily as needed.  1  . trolamine salicylate (ASPERCREME) 10 % cream Apply 1 application topically as needed for muscle pain. Reported on 06/01/2016    . varenicline (CHANTIX) 0.5 MG tablet Take 1 tablet (0.5 mg total) by mouth daily. 30 tablet 3  . Vitamin D, Cholecalciferol, 1000 units CAPS Take 1,000 Units by mouth daily.     No current facility-administered  medications for this visit.     OBJECTIVE: Middle-aged white woman  There were no vitals filed for this visit.There is no height or weight on file to calculate BMI.    LAB RESULTS:  CMP     Component Value Date/Time   NA 140 08/13/2016 0926   NA 139 07/28/2016 1118   K 4.3 08/13/2016 0926   K 4.1 07/28/2016 1118   CL 107 08/13/2016 0926   CO2 25 08/13/2016 0926   CO2 26 07/28/2016 1118   GLUCOSE 96 08/13/2016  0926   GLUCOSE 91 07/28/2016 1118   BUN 8 08/13/2016 0926   BUN 8.6 07/28/2016 1118   CREATININE 0.94 08/13/2016 0926   CREATININE 0.8 07/28/2016 1118   CALCIUM 9.2 08/13/2016 0926   CALCIUM 9.7 07/28/2016 1118   PROT 6.7 07/28/2016 1118   ALBUMIN 4.0 07/28/2016 1118   AST 18 07/28/2016 1118   ALT 13 07/28/2016 1118   ALKPHOS 50 07/28/2016 1118   BILITOT <0.30 07/28/2016 1118   GFRNONAA >60 08/13/2016 0926   GFRAA >60 08/13/2016 0926    INo results found for: SPEP, UPEP  Lab Results  Component Value Date   WBC 5.0 08/13/2016   NEUTROABS 4.2 07/28/2016   HGB 12.9 08/13/2016   HCT 40.8 08/13/2016   MCV 96.9 08/13/2016   PLT 171 08/13/2016      Chemistry      Component Value Date/Time   NA 140 08/13/2016 0926   NA 139 07/28/2016 1118   K 4.3 08/13/2016 0926   K 4.1 07/28/2016 1118   CL 107 08/13/2016 0926   CO2 25 08/13/2016 0926   CO2 26 07/28/2016 1118   BUN 8 08/13/2016 0926   BUN 8.6 07/28/2016 1118   CREATININE 0.94 08/13/2016 0926   CREATININE 0.8 07/28/2016 1118      Component Value Date/Time   CALCIUM 9.2 08/13/2016 0926   CALCIUM 9.7 07/28/2016 1118   ALKPHOS 50 07/28/2016 1118   AST 18 07/28/2016 1118   ALT 13 07/28/2016 1118   BILITOT <0.30 07/28/2016 1118       No results found for: LABCA2  No components found for: LABCA125  No results for input(s): INR in the last 168 hours.  Urinalysis    Component Value Date/Time   COLORURINE YELLOW 04/18/2013 [place] 04/18/2013 1222   LABSPEC 1.011 04/18/2013  1222   PHURINE 5.5 04/18/2013 1222   GLUCOSEU NEGATIVE 04/18/2013 1222   HGBUR TRACE (A) 04/18/2013 1222   BILIRUBINUR negative 03/28/2015 1321   KETONESUR NEGATIVE 04/18/2013 1222   PROTEINUR negative 03/28/2015 1321   PROTEINUR NEGATIVE 04/18/2013 1222   UROBILINOGEN 0.2 03/28/2015 1321   UROBILINOGEN 0.2 04/18/2013 1222   NITRITE negative 03/28/2015 1321   NITRITE NEGATIVE 04/18/2013 1222   LEUKOCYTESUR Trace 03/28/2015 1321     STUDIES: No results found.   ELIGIBLE FOR AVAILABLE RESEARCH PROTOCOL: no  ASSESSMENT: 56 y.o. 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  (2) status post left mastectomy and axillary lymph node dissection  with immediate expander placement 07/18/2015 for an mpT4 pN2,stage IIIB invasive ductal carcinoma, grade 3, with negative margins.  (a) definitive implant exchange to be scheduled in December   METASTATIC DISEASE: October 2016  (3) CT scan of the chest abdomen and pelvis  09/16/2015 shows metastatic lesions in the right scapula, left iliac crest, L4, and T spine. There were questionable liver cysts, with repeat CT scan 03/02/2016 showing possible right upper lobe lung lesions and possibly increased liver lesions  (a) CT scan of the chest 06/17/2016 shows no active disease in the lungs or liver  (b) Bone scan July 2017 showed no evidence of bony metastatic disease   (c) head CT 07/08/2016 showed a cerebellar lesion, confirmed by MRI 07/23/2016, status post craniotomy 08/14/2016, confirming a metastatic deposit which was estrogen and progesterone receptor negative, HER-2 amplified  with a signals ratio of 7.16, number per cell 13.25  (d) CA 27-29 not informative  (4) received docetaxel every 3 weeks 6 together with trastuzumab and pertuzumab, last docetaxel dose  02/11/2016  (5) adjuvant radiation7/03/2016 to 06/26/2016 at [facility]: 1. The Left chest wall was treated to 23.4 Gy in 13 fractions at 1.8 Gy per fraction. 2. The Left chest wall was boosted to 10 Gy in 5 fractions at 2 Gy per fraction. 3. The Left Sclav/PAB was treated to 23.4 Gy in 13 fractions at 1.8 Gy per fraction.  Including the patient's treatment in [place] (received 15 fractions per Dr. Maryan Rued near [place], [place]), the patient received 50.4 Gy to the left chest wall and supraclavicular region.   (6) started trastuzumab and pertuzumab October 2016, continuing every 3 weeks,  (a) echocardiogram 02/26/2016 showed a well preserved ejection fraction  (b) echocardiogram 07/01/2016 shows an ejection fraction in the 60-65%   (c) pertuzumab discontinued 08/25/2016 with uncontrolled diarrhea  (d) echocardiogram 11/11/2016 showed an ejection fraction in the 60-65%  (7) started denosumab/Xgeva October 2017, repeated every 6 weeks  (8) started anastrozole October 2017   (a) bone scan 11/10/2016 shows no active disease  (b) chest CT scan 11/10/2016 stable, with no evidence of active disease  (9) history of bipolar disorder  (a) lithium level checked every 6 weeks  (10) mild anemia with a significant drop in the MCV, ferritin 10 06/03/2016,   (a) Feraheme given 06/12/2016 and 06/18/2016  (11) tobacco abuse: Chantix started 06/18/2016  (12) 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  (a) brain irradiation10/19/17-11/08/17: Whole brain/ 35 Gy in 14 fractions   (b) repeat brain MRI obtained 12/10/2016 shows no active disease in the brain   PLAN:      Charlestine Massed, NP Medical Oncology and Hematology [facility] [place], [place] [address] Tel. [phone]    Fax. [phone]

## 2017-01-13 NOTE — Progress Notes (Signed)
Dr. Royce Macadamia, Anesthesia,  reviewed pt recent (01/12/17) and previous (12/03/16)  EKG and advised that pt be seen by cardiology. Spoke with Manuela Schwartz in Radiology regarding anesthesiologist recommendation. Manuela Schwartz stated that she would follow up.

## 2017-01-14 ENCOUNTER — Ambulatory Visit (HOSPITAL_COMMUNITY): Admission: RE | Admit: 2017-01-14 | Payer: Medicaid Other | Source: Ambulatory Visit

## 2017-01-14 ENCOUNTER — Encounter (HOSPITAL_COMMUNITY): Admission: RE | Payer: Self-pay | Source: Ambulatory Visit

## 2017-01-14 ENCOUNTER — Ambulatory Visit (HOSPITAL_COMMUNITY): Admission: RE | Admit: 2017-01-14 | Payer: Medicaid Other | Source: Ambulatory Visit | Admitting: Radiation Oncology

## 2017-01-14 SURGERY — RADIOLOGY WITH ANESTHESIA
Anesthesia: General

## 2017-01-15 ENCOUNTER — Telehealth: Payer: Self-pay | Admitting: *Deleted

## 2017-01-15 NOTE — Telephone Encounter (Signed)
Medical Assistant left message on patient's home and cell voicemail. Voicemail states to give a call back to [place] with [facility] at [phone].  !!!Patients shower chair prescription has been placed at the front desk!!!

## 2017-01-18 ENCOUNTER — Ambulatory Visit: Payer: Self-pay | Admitting: Radiation Oncology

## 2017-01-18 DIAGNOSIS — E559 Vitamin D deficiency, unspecified: Secondary | ICD-10-CM | POA: Insufficient documentation

## 2017-01-19 ENCOUNTER — Telehealth: Payer: Self-pay | Admitting: *Deleted

## 2017-01-19 ENCOUNTER — Telehealth: Payer: Self-pay | Admitting: Oncology

## 2017-01-19 ENCOUNTER — Other Ambulatory Visit: Payer: Self-pay | Admitting: *Deleted

## 2017-01-19 ENCOUNTER — Ambulatory Visit: Payer: Self-pay

## 2017-01-19 ENCOUNTER — Ambulatory Visit: Payer: Self-pay | Admitting: Adult Health

## 2017-01-19 ENCOUNTER — Other Ambulatory Visit: Payer: Self-pay

## 2017-01-19 DIAGNOSIS — R110 Nausea: Secondary | ICD-10-CM

## 2017-01-19 MED ORDER — ONDANSETRON HCL 8 MG PO TABS: 8.0000 mg | ORAL_TABLET | Freq: Three times a day (TID) | ORAL | 0 refills | Status: DC | PRN

## 2017-01-19 NOTE — Telephone Encounter (Signed)
lvm to inform pt of r/s appt to 2/23 per LOS

## 2017-01-19 NOTE — Telephone Encounter (Signed)
This RN spoke with Barrie this AM per her call stating need to reschedule treatment " I need to reschedule my infusion "  This RN spoke with pt per appointment and recent inpt at [facility].  Naila stated " I am feeling much more in control but am very concerned about missing my therapy "  Plan per discussion is to reschedule infusion to later this week.  This RN did discuss with patient recent inpt stay and how she is feeling at present.  Lagina stated she is feeling better " but I am still very concerned - seems like I am not as strong as I was last year and I get worried "  This RN validated her concerns as well as our goal is for her to be strong in body and mind.   Ajada verbalized appreciation.  Note per conversation Avaia requested a refill on zofran - due to ongoing nausea due to decreased appetite " and then when I take my medications I get nauseated "  Pharmacy verified and zofran refilled.

## 2017-01-20 NOTE — Progress Notes (Addendum)
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  She is doing well today from a breast cancer standpoint.  She is taking Anastrozole daily.  She is receiving the trastuzumab every three weeks.  She denies any new pain, or neurologic symptoms today. She is requesting a refill on her Tramodol and her Lomotil today.  She does have a mild decreased appetite and is requesting to receive some IV fluids with her Herceptin today.  [name] was involuntarily committed on 01/13/17.  She had thoughts of suicide.  She is feeling much better today and tells me her medications are where they need to be.  She has f/u with a therapist on 3/23 and is working on getting in with a psychiatrist.  She is very anxious and would like something for anxiety.  She also reports feeling shaky.  Her medication dosages are unclear because she did not bring her bottles with her to today's appointment and thinks some of her medications were changed after her recent hospitalization.  She is requesting to meet with the chaplain and social work today.  She is also interested in receiving information about advanced directives.  REVIEW OF SYSTEMS: [name] has had two episodes of diarrhea this week.  She denies fevers, chills, fatigue, weight loss, nausea, vomiting, constipation, cough, orthopnea, dyspnea on exertion, lower extremity swelling, chest  pain, palpitations, abdominal pain, bone pain, skin changes, focal weakness, or numbness, tingling, or headaches. She does note that she is ready to get back in with her plastic surgeon to have her expander switched out for an implant.   BREAST CANCER HISTORY: From the original intake note:  [name] was aware of a "lemon sized lump in" her left axilla for about a year before bringing it to medical attention. By then she had developed left breast and left axillary swelling (June 2016). She presented to the local emergency room and had a chest CT scan 06/06/2015 which showed a nodule in the left breast measuring 0.9 cm and questionable left axillary adenopathy. She then proceeded to bilateral diagnostic mammography and left breast ultrasonography 06/19/2015. There were no prior films for comparison (last mammography 12 years prior).. The breast density was category C. Mammography showed in the left breast upper inner quadrant a 7 cm area including a small mass and significant pleomorphic calcifications. Ultrasonography defined the mass as measuring 1.2 cm. The left axilla appeared unremarkable. There was significant skin edema.  Biopsy of the left breast mass 06/19/2015 showed (SP [id number]) an invasive ductal carcinoma, grade 2, estrogen receptor 83% positive, progesterone receptor 26% positive, and HER-2 amplified by immunohistochemstry with a 3+ reading. The patient had biopsies of a separate area in the left breast August of the same year and this showed atypical ductal hyperplasia. (SP [id number]).  Accordingly after appropriate discussion on 08/21/2015 the patient proceeded to left mastectomy with left axillary sentinel lymph node sampling, which, since the lymph nodes were positive, extended to the procedure to left axillary lymph  node dissection. The pathology (SP [id number]) showed an invasive ductal carcinoma, grade 3, measuring in excess of 9 cm. There were also skin satellites, not contiguous with the  invasive carcinoma. Margins were clear and ample. There was evidence of lymphovascular invasion. A total of 15 lymph nodes were removed, including 5 sentinel lymph nodes, all of which were positive, so that the final total was 14 out of 15 lymph nodes involved by tumor. There was evidence of extranodal extension. The final pathology was pT4b pN2a, stage IIIB  CA-27-29 and CEA 09/19/2015 were non-informative October 2016.  Unfortunately CT scans of the chest abdomen and pelvis 09/16/2015 showed bony metastases to the right scapula, left iliac crest, and also L4 and T-spine. There were questionable liver cysts which on repeat CT scan 03/02/2016 appear to be a little bit more well-defined, possibly a little larger. There were also some possible right upper lobe lung lesions.  Adjuvant treatment consisted of docetaxel, trastuzumab and pertuzumab, with the final (6th) docetaxel dose given 02/11/2016. She continues on trastuzumab and pertuzumab, with the 11th cycle given 05/05/2016. Echocardiogram 02/26/2016 showed an ejection fraction of 55%. She receives denosumab/Xgeva every 6 weeks.. She also receives radiation, started 06/09/201, to be completed 06/26/2016.  Her subsequent history is as detailed below   PAST MEDICAL HISTORY: Past Medical History:  Diagnosis Date  . Alcohol abuse   . Anemia    during chemo  . Anxiety    At age 46  . Arthritis Dx 2010  . Bipolar disorder ([place])   . Cancer (HCC)    breast  . Chronic pain   . Depression   . Fibromyalgia Dx 2005  . GERD (gastroesophageal reflux disease)   . Headache    hx  migraines  . Opiate dependence ([place])   . Port-a-cath in place   . PTSD (post-traumatic stress disorder)     PAST SURGICAL HISTORY: Past Surgical History:  Procedure Laterality Date  . APPLICATION OF CRANIAL NAVIGATION N/A 08/14/2016   Procedure: APPLICATION OF CRANIAL NAVIGATION;  Surgeon: Erline Levine, MD;  Location: [facility];  Service: Neurosurgery;  Laterality:  N/A;  . BREAST RECONSTRUCTION Left    with silicone implant  . CRANIOTOMY N/A 08/14/2016   Procedure: CRANIOTOMY TUMOR EXCISION WITH Lucky Rathke;  Surgeon: Erline Levine, MD;  Location: [facility];  Service: Neurosurgery;  Laterality: N/A;  . FIBULA FRACTURE SURGERY    . MASTECTOMY Left   . RADIOLOGY WITH ANESTHESIA N/A 07/23/2016   Procedure: MRI OF BRAIN WITH AND WITHOUT;  Surgeon: Medication Radiologist, MD;  Location: [facility];  Service: Radiology;  Laterality: N/A;  . right power port placement Right     FAMILY HISTORY Family History  Problem Relation Age of Onset  . Diabetes Mother   . Bipolar disorder Mother   . CAD Father   The patient's father still living, age 45, in [place]. He had prostate cancer at some point in the past. The patient's mother died at age 50 from complications of diabetes. The patient had no brothers, 2 sisters. A paternal grandmother had lung cancer in the setting of tobacco abuse. There is no other history of cancer in the family to her knowledge  GYNECOLOGIC HISTORY:  No LMP recorded. Patient has had an ablation. Menarche approximately age 42. First live birth in 78. The patient is GX P2. She underwent endometrial ablation in 2016.  SOCIAL HISTORY:  The patient is originally from [place]. She has lived in [place] before but more recently was in  [place]. She is back here because she could not afford her rent in [place]. She is living here and a temporary situation. She is divorced. HER-2 children are [name] who lives in [place] and works as a Development worker, community, and [name] who also lives in [place] and works as a Catering manager. The patient has a grandchild, Arelia Longest, 65 years old as of July 2017, living in [place] with his mother. The patient has not established herself with a local church yet.    ADVANCED DIRECTIVES: Not in place; at the 06/03/2016 visit the patient was given the appropriate forms to complete and notarize at her  discretion   HEALTH MAINTENANCE: Social History  Substance Use Topics  . Smoking status: Current Every Day Smoker    Packs/day: 0.50    Types: Cigarettes  . Smokeless tobacco: Never Used     Comment: Pt is on Chantix at present time  . Alcohol use Yes     Comment: no ETOH since 08/22/12     Colonoscopy:  PAP:  Bone density:   Allergies  Allergen Reactions  . Demerol Itching and Nausea And Vomiting  . Erythromycin Rash    Can take zpak    Current Outpatient Prescriptions  Medication Sig Dispense Refill  . cetirizine (ZYRTEC) 10 MG tablet Take 1 tablet (10 mg total) by mouth daily. 30 tablet 11  . dexamethasone (DECADRON) 4 MG tablet Take 1 tablet (4 mg total) by mouth 2 (two) times daily. 60 tablet 0  . diphenoxylate-atropine (LOMOTIL) 2.5-0.025 MG tablet Take 2 tablets by mouth 4 (four) times daily as needed for diarrhea or loose stools. 40 tablet 0  . fluticasone (FLONASE) 50 MCG/ACT nasal spray Place 1 spray into the nose daily.    [name] gabapentin (NEURONTIN) 300 MG capsule Take 2 capsules (600 mg total) by mouth 3 (three) times daily. 300 mg at night for one day, twice daily for one day, then three times daily 180 capsule 2  . hyaluronate sodium (RADIAPLEXRX) GEL Apply 1 application topically.    [name] HYDROcodone-acetaminophen (NORCO) 7.5-325 MG tablet Take 1-2 tablets by mouth every 6 (six) hours as needed for severe pain (give one tab, then if other tab is needed, may give 30 min later.). 60 tablet 0  . ibuprofen (ADVIL,MOTRIN) 200 MG tablet Take 800 mg by mouth every 6 (six) hours as needed for mild pain.    [name] lamoTRIgine (LAMICTAL) 100 MG tablet Take 1.5 tablets (150 mg total) by mouth 2 (two) times daily. 60 tablet 3  . lidocaine-prilocaine (EMLA) cream Apply 1 application topically as needed. 30 g 0  . lithium carbonate 300 MG capsule Take 1 capsule (300 mg total) by mouth 2 (two) times daily with a meal. 60 capsule 3  . Loperamide HCl (IMODIUM PO) Take 1 tablet by mouth  daily as needed (diarhea).    . LORazepam (ATIVAN) 0.5 MG tablet Take 1 tablet (0.5 mg total) by mouth every 8 (eight) hours as needed for anxiety. 30 tablet 1  . ondansetron (ZOFRAN) 8 MG tablet Take 1 tablet (8 mg total) by mouth every 8 (eight) hours as needed for nausea or vomiting. 30 tablet 1  . pantoprazole (PROTONIX) 40 MG tablet Take 1 tablet (40 mg total) by mouth daily. 30 tablet 3  . polyethylene glycol powder (GLYCOLAX/MIRALAX) powder Take 17 g by mouth 2 (two) times daily as needed for mild constipation. (Patient not taking: Reported on 08/13/2016) 3350 g 3  . potassium chloride (K-DUR) 10 MEQ tablet Take 1  tablet (10 mEq total) by mouth daily. 30 tablet 3  . prochlorperazine (COMPAZINE) 10 MG tablet Take 10 mg by mouth daily as needed for nausea.     [name] tiZANidine (ZANAFLEX) 4 MG tablet Take 1 tablet (4 mg total) by mouth daily. 30 tablet 0  . traMADol (ULTRAM) 50 MG tablet Take 1 tablet (50 mg total) by mouth every 6 (six) hours as needed. 60 tablet 0  . trazodone (DESYREL) 300 MG tablet Take 1 tablet (300 mg total) by mouth at bedtime. 30 tablet 3  . triamcinolone cream (KENALOG) 0.5 % Apply 1 application topically daily as needed.  1  . trolamine salicylate (ASPERCREME) 10 % cream Apply 1 application topically as needed for muscle pain. Reported on 06/01/2016    . varenicline (CHANTIX) 0.5 MG tablet Take 1 tablet (0.5 mg total) by mouth daily. 30 tablet 3  . Vitamin D, Cholecalciferol, 1000 units CAPS Take 1,000 Units by mouth daily.     No current facility-administered medications for this visit.     OBJECTIVE:   Vitals:   01/22/17 0829  BP: 118/69  Pulse: 93  Resp: 18  Temp: 97.9 F (36.6 C)  TempSrc: Oral  SpO2: 98%  Weight: 139 lb 14.4 oz (63.5 kg)  Body mass index is 23.28 kg/m.  GENERAL: Patient is a well appearing female in no acute distress HEENT:  Sclerae anicteric.  Oropharynx clear and moist. No ulcerations or evidence of oropharyngeal candidiasis. Neck is  supple.  NODES:  No cervical, supraclavicular, infraclavicular, or axillary lymphadenopathy palpated.  BREAST EXAM:  Left breast s/p mastectomy with expander in place, no swelling, skin change, or nodularity around expander, or to the chest wall.   LUNGS:  Clear to auscultation bilaterally.  No wheezes or rhonchi. HEART:  Regular rate and rhythm. No murmur appreciated. ABDOMEN:  Soft, nontender.  Positive, normoactive bowel sounds. No organomegaly palpated. MSK:  No focal spinal tenderness to palpation. Full range of motion bilaterally in the upper extremities. EXTREMITIES:  No peripheral edema.   SKIN:  Clear with no obvious rashes or skin changes. No nail dyscrasia. NEURO:  Nonfocal. Well oriented.  Appropriate affect. Patient with resting tremor and intention tremors noted.      LAB RESULTS:  CMP     Component Value Date/Time   NA 140 08/13/2016 0926   NA 139 07/28/2016 1118   K 4.3 08/13/2016 0926   K 4.1 07/28/2016 1118   CL 107 08/13/2016 0926   CO2 25 08/13/2016 0926   CO2 26 07/28/2016 1118   GLUCOSE 96 08/13/2016 0926   GLUCOSE 91 07/28/2016 1118   BUN 8 08/13/2016 0926   BUN 8.6 07/28/2016 1118   CREATININE 0.94 08/13/2016 0926   CREATININE 0.8 07/28/2016 1118   CALCIUM 9.2 08/13/2016 0926   CALCIUM 9.7 07/28/2016 1118   PROT 6.7 07/28/2016 1118   ALBUMIN 4.0 07/28/2016 1118   AST 18 07/28/2016 1118   ALT 13 07/28/2016 1118   ALKPHOS 50 07/28/2016 1118   BILITOT <0.30 07/28/2016 1118   GFRNONAA >60 08/13/2016 0926   GFRAA >60 08/13/2016 0926    INo results found for: SPEP, UPEP  Lab Results  Component Value Date   WBC 5.0 08/13/2016   NEUTROABS 4.2 07/28/2016   HGB 12.9 08/13/2016   HCT 40.8 08/13/2016   MCV 96.9 08/13/2016   PLT 171 08/13/2016      Chemistry      Component Value Date/Time   NA 140 08/13/2016 0926  NA 139 07/28/2016 1118   K 4.3 08/13/2016 0926   K 4.1 07/28/2016 1118   CL 107 08/13/2016 0926   CO2 25 08/13/2016 0926   CO2 26  07/28/2016 1118   BUN 8 08/13/2016 0926   BUN 8.6 07/28/2016 1118   CREATININE 0.94 08/13/2016 0926   CREATININE 0.8 07/28/2016 1118      Component Value Date/Time   CALCIUM 9.2 08/13/2016 0926   CALCIUM 9.7 07/28/2016 1118   ALKPHOS 50 07/28/2016 1118   AST 18 07/28/2016 1118   ALT 13 07/28/2016 1118   BILITOT <0.30 07/28/2016 1118       No results found for: LABCA2  No components found for: LABCA125  No results for input(s): INR in the last 168 hours.  Urinalysis    Component Value Date/Time   COLORURINE YELLOW 04/18/2013 [place] 04/18/2013 1222   LABSPEC 1.011 04/18/2013 1222   PHURINE 5.5 04/18/2013 1222   GLUCOSEU NEGATIVE 04/18/2013 1222   HGBUR TRACE (A) 04/18/2013 1222   BILIRUBINUR negative 03/28/2015 1321   KETONESUR NEGATIVE 04/18/2013 1222   PROTEINUR negative 03/28/2015 1321   PROTEINUR NEGATIVE 04/18/2013 1222   UROBILINOGEN 0.2 03/28/2015 1321   UROBILINOGEN 0.2 04/18/2013 1222   NITRITE negative 03/28/2015 1321   NITRITE NEGATIVE 04/18/2013 1222   LEUKOCYTESUR Trace 03/28/2015 1321     STUDIES: No results found.   ELIGIBLE FOR AVAILABLE RESEARCH PROTOCOL: no  ASSESSMENT: 56 y.o. [place] woman with stage IV left-sided breast cancer involving bone and central nervous system  (1) s/p left breast lower inner quadrant biopsy 06/19/2015 for a clinical T2-3 NX invasive ductal carcinoma, grade 2, triple positive.  (2) status post left mastectomy and axillary lymph node dissection  with immediate expander placement 07/18/2015 for an mpT4 pN2,stage IIIB invasive ductal carcinoma, grade 3, with negative margins.  (a) definitive implant exchange to be scheduled in December   METASTATIC DISEASE: October 2016  (3) CT scan of the chest abdomen and pelvis  09/16/2015 shows metastatic lesions in the right scapula, left iliac crest, L4, and T spine. There were questionable liver cysts, with repeat CT scan 03/02/2016 showing possible right  upper lobe lung lesions and possibly increased liver lesions  (a) CT scan of the chest 06/17/2016 shows no active disease in the lungs or liver  (b) Bone scan July 2017 showed no evidence of bony metastatic disease   (c) head CT 07/08/2016 showed a cerebellar lesion, confirmed by MRI 07/23/2016, status post craniotomy 08/14/2016, confirming a metastatic deposit which was estrogen and progesterone receptor negative, HER-2 amplified with a signals ratio of 7.16, number per cell 13.25  (d) CA 27-29 not informative  (4) received docetaxel every 3 weeks 6 together with trastuzumab and pertuzumab, last docetaxel dose 02/11/2016  (5) adjuvant radiation7/03/2016 to 06/26/2016 at [place]: 1. The Left chest wall was treated to 23.4 Gy in 13 fractions at 1.8 Gy per fraction. 2. The Left chest wall was boosted to 10 Gy in 5 fractions at 2 Gy per fraction. 3. The Left Sclav/PAB was treated to 23.4 Gy in 13 fractions at 1.8 Gy per fraction.  Including the patient's treatment in [place] (received 15 fractions per Dr. Maryan Rued near [place], [place]), the patient received 50.4 Gy to the left chest wall and supraclavicular region.   (6) started trastuzumab and pertuzumab October 2016, continuing every 3 weeks,  (a) echocardiogram 02/26/2016 showed a well preserved ejection fraction  (b) echocardiogram 07/01/2016 shows an ejection fraction in the 60-65%   (  c) pertuzumab discontinued 08/25/2016 with uncontrolled diarrhea  (d) echocardiogram 11/11/2016 showed an ejection fraction in the 60-65%  (7) started denosumab/Xgeva October 2017, repeated every 6 weeks  (8) started anastrozole October 2017   (a) bone scan 11/10/2016 shows no active disease  (b) chest CT scan 11/10/2016 stable, with no evidence of active disease  (9) history of bipolar disorder  (a) lithium level checked every 6 weeks  (10) mild anemia with a significant drop in the MCV, ferritin 10 06/03/2016,   (a) Feraheme given 06/12/2016 and  06/18/2016  (11) tobacco abuse: Chantix started 06/18/2016   (12) 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  (a) brain irradiation10/19/17-11/08/17: Whole brain/ 35 Gy in 14 fractions   (b) repeat brain MRI obtained 12/10/2016 shows no active disease in the brain   PLAN: Avaree is doing well today.  She will proceed with treatment with Trastuzumab today.  She will receive IV fluids and Lorazepam 0.23m orally with her Trastuzumab as well.  I refilled her Tramadol and Lomotil today ([place] CSRS reviewed).  The chaplain and social workers were contacted about her request to meet with them.  I gave her information on advance directives.    Dr. MJana Hakimmet with her today as well.    The patient's working plan is:  --continue with Trastuzumab every three weeks along with an appointment with myself or Dr. MJana Hakimprior to receiving treatment  --continue taking Anastrozole daily  --start Lorazepam 0.545mPO TID Q8H PRN #90 today  --she was encouraged to continue to call and get appointments set up and perhaps expedited with a therapist and a psychiatrist  --she will undergo CT chest and bone scan in April 2018 (orders placed today) --MRI brain is ordered and scheduled for April 2018 --f/u with Dr. MoLisbeth Renshaws scheduled for April 2018 --I contacted HeKevan RosebushN and requested f/u and echocardiogram be scheduled for patient with Dr. BeHaroldine Lawsn late March, early April. --She was encouraged to contact usKoreahen she returns home today and give usKoreaer medications names and dosages off of the bottles so we can accurately update her records.    EmDenajaill return in 3 weeks on 02/12/17 for labs, an appointment, and Trastuzumab.  She knows to call usKoreaf she has any questions or concerns prior to her next appointment.        LiCharlestine MassedNP Medical Oncology and Hematology CoSaint [facility] Memphis052 [address] DrivevMaineNC [address]el. [phone]   Fax. [phone] ADDENDUM: EmMirahs doing remarkably well as far as her breast cancer is concerned. ASt this point we have no measurable disease in the brain or peripherally. Furthermore ishe is tolerating treatment well.  The problem is psychiatric. She has never looked up with a counselor here. She has been evaluated by psychiatry several times during inpatient stays but there has been no outpatient follow-up.  I explained again to EmAddaleighhat we are not psychiatrist's, that we are doing the best we can to manage her psychiatric medications, but that she needs to have someone that she can go to immediately when she has symptoms of disabling anxiety or inability to function or when she is having side effects from his psychiatric medications.  She repeated today as she has told me before that she has made an appointment with a counselor but so far that has not occurred.  At this point she appears to be back in her previous  residence. This appears to be a safe place where there is some supervision. At the least the manager was able to drive her to the emergency room or to behavioral care when she was unable to drive herself.  I wish I could resolve her psychiatric problems but all I not to do at this point is to continue her psychiatric medications and urge her to obtain professional help. As far as the breast cancer is concerned we are continuing the treatment and we are seeing her on a regular basis to provide her a measure of structural support  I personally saw this patient and performed a substantive portion of this encounter with the listed APP documented above.   Chauncey Cruel, MD Medical Oncology and Hematology [facility] [address] [place], [place] [address] Tel. [phone]    Fax. [phone]

## 2017-01-21 ENCOUNTER — Telehealth: Payer: Self-pay | Admitting: Family Medicine

## 2017-01-21 NOTE — Telephone Encounter (Signed)
Pt calling in regards to referral for home health that was discussed at last OV

## 2017-01-22 ENCOUNTER — Ambulatory Visit (HOSPITAL_BASED_OUTPATIENT_CLINIC_OR_DEPARTMENT_OTHER): Payer: Medicaid Other

## 2017-01-22 ENCOUNTER — Other Ambulatory Visit: Payer: Self-pay | Admitting: *Deleted

## 2017-01-22 ENCOUNTER — Encounter: Payer: Self-pay | Admitting: General Practice

## 2017-01-22 ENCOUNTER — Encounter: Payer: Self-pay | Admitting: *Deleted

## 2017-01-22 ENCOUNTER — Other Ambulatory Visit (HOSPITAL_BASED_OUTPATIENT_CLINIC_OR_DEPARTMENT_OTHER): Payer: Medicaid Other

## 2017-01-22 ENCOUNTER — Ambulatory Visit (HOSPITAL_BASED_OUTPATIENT_CLINIC_OR_DEPARTMENT_OTHER): Payer: Medicaid Other | Admitting: Adult Health

## 2017-01-22 VITALS — BP 118/69 | HR 93 | Temp 97.9°F | Resp 18 | Wt 139.9 lb

## 2017-01-22 DIAGNOSIS — C773 Secondary and unspecified malignant neoplasm of axilla and upper limb lymph nodes: Secondary | ICD-10-CM

## 2017-01-22 DIAGNOSIS — Z720 Tobacco use: Secondary | ICD-10-CM | POA: Diagnosis not present

## 2017-01-22 DIAGNOSIS — C50312 Malignant neoplasm of lower-inner quadrant of left female breast: Secondary | ICD-10-CM | POA: Diagnosis not present

## 2017-01-22 DIAGNOSIS — Z79811 Long term (current) use of aromatase inhibitors: Secondary | ICD-10-CM | POA: Diagnosis not present

## 2017-01-22 DIAGNOSIS — Z170 Estrogen receptor positive status [ER+]: Secondary | ICD-10-CM | POA: Diagnosis not present

## 2017-01-22 DIAGNOSIS — C7931 Secondary malignant neoplasm of brain: Secondary | ICD-10-CM

## 2017-01-22 DIAGNOSIS — F319 Bipolar disorder, unspecified: Secondary | ICD-10-CM

## 2017-01-22 DIAGNOSIS — C7951 Secondary malignant neoplasm of bone: Secondary | ICD-10-CM | POA: Diagnosis not present

## 2017-01-22 DIAGNOSIS — Z5112 Encounter for antineoplastic immunotherapy: Secondary | ICD-10-CM | POA: Diagnosis present

## 2017-01-22 LAB — COMPREHENSIVE METABOLIC PANEL
ALT: 15 U/L (ref 0–55)
AST: 16 U/L (ref 5–34)
Albumin: 4.1 g/dL (ref 3.5–5.0)
Alkaline Phosphatase: 46 U/L (ref 40–150)
Anion Gap: 9 mEq/L (ref 3–11)
BUN: 5.6 mg/dL — ABNORMAL LOW (ref 7.0–26.0)
CO2: 24 mEq/L (ref 22–29)
Calcium: 9.9 mg/dL (ref 8.4–10.4)
Chloride: 105 mEq/L (ref 98–109)
Creatinine: 0.9 mg/dL (ref 0.6–1.1)
EGFR: 76 mL/min/{1.73_m2} — ABNORMAL LOW (ref >90)
Glucose: 110 mg/dl (ref 70–140)
Potassium: 4.1 mEq/L (ref 3.5–5.1)
Sodium: 138 mEq/L (ref 136–145)
Total Bilirubin: 0.38 mg/dL (ref 0.20–1.20)
Total Protein: 6.6 g/dL (ref 6.4–8.3)

## 2017-01-22 LAB — CBC WITH DIFFERENTIAL/PLATELET
BASO%: 0.3 % (ref 0.0–2.0)
Basophils Absolute: 0 10*3/uL (ref 0.0–0.1)
EOS%: 4 % (ref 0.0–7.0)
Eosinophils Absolute: 0.2 10*3/uL (ref 0.0–0.5)
HCT: 38.8 % (ref 34.8–46.6)
HGB: 12.6 g/dL (ref 11.6–15.9)
LYMPH%: 16.1 % (ref 14.0–49.7)
MCH: 31.7 pg (ref 25.1–34.0)
MCHC: 32.5 g/dL (ref 31.5–36.0)
MCV: 97.5 fL (ref 79.5–101.0)
MONO#: 0.3 10*3/uL (ref 0.1–0.9)
MONO%: 8 % (ref 0.0–14.0)
NEUT#: 2.9 10*3/uL (ref 1.5–6.5)
NEUT%: 71.6 % (ref 38.4–76.8)
Platelets: 226 10*3/uL (ref 145–400)
RBC: 3.98 10*6/uL (ref 3.70–5.45)
RDW: 12.3 % (ref 11.2–14.5)
WBC: 4 10*3/uL (ref 3.9–10.3)
lymph#: 0.6 10*3/uL — ABNORMAL LOW (ref 0.9–3.3)

## 2017-01-22 MED ORDER — HEPARIN SOD (PORK) LOCK FLUSH 100 UNIT/ML IV SOLN
500.0000 [IU] | Freq: Once | INTRAVENOUS | Status: AC | PRN
  Administered 2017-01-22: 500 [IU]
  Filled 2017-01-22: qty 5

## 2017-01-22 MED ORDER — LORAZEPAM 1 MG PO TABS
ORAL_TABLET | ORAL | Status: AC
  Filled 2017-01-22: qty 1

## 2017-01-22 MED ORDER — TRASTUZUMAB CHEMO 150 MG IV SOLR
6.0000 mg/kg | Freq: Once | INTRAVENOUS | Status: AC
  Administered 2017-01-22: 420 mg via INTRAVENOUS
  Filled 2017-01-22: qty 20

## 2017-01-22 MED ORDER — SODIUM CHLORIDE 0.9% FLUSH
10.0000 mL | INTRAVENOUS | Status: DC | PRN
  Administered 2017-01-22: 10 mL
  Filled 2017-01-22: qty 10

## 2017-01-22 MED ORDER — DIPHENOXYLATE-ATROPINE 2.5-0.025 MG PO TABS: 1.0000 | ORAL_TABLET | Freq: Four times a day (QID) | ORAL | 0 refills | Status: DC | PRN

## 2017-01-22 MED ORDER — LORAZEPAM 0.5 MG PO TABS: 0.5000 mg | ORAL_TABLET | Freq: Three times a day (TID) | ORAL | 0 refills | Status: DC | PRN

## 2017-01-22 MED ORDER — SODIUM CHLORIDE 0.9 % IV SOLN
Freq: Once | INTRAVENOUS | Status: AC
  Administered 2017-01-22: 10:00:00 via INTRAVENOUS

## 2017-01-22 MED ORDER — LORAZEPAM 1 MG PO TABS
0.5000 mg | ORAL_TABLET | Freq: Once | ORAL | Status: AC
  Administered 2017-01-22: 0.5 mg via ORAL

## 2017-01-22 MED ORDER — DENOSUMAB 120 MG/1.7ML ~~LOC~~ SOLN
120.0000 mg | Freq: Once | SUBCUTANEOUS | Status: DC
  Filled 2017-01-22: qty 1.7

## 2017-01-22 MED ORDER — TRAMADOL HCL 50 MG PO TABS: 50.0000 mg | ORAL_TABLET | Freq: Four times a day (QID) | ORAL | 0 refills | Status: DC | PRN

## 2017-01-22 NOTE — Progress Notes (Signed)
[facility] Spiritual Care Note  Responded to pt's request to complete Advance Directive.  Reviewed forms with her. After Warren Lacy completed the forms with her preferences, I consulted with Polo Riley Somers/LCSW/[employer] to notarize.  Lauren submitted completed packet to HIM for scanning into pt's EMR.   Advanced Directives 01/22/2017  Does Patient Have a Medical Advance Directive? Yes  Type of Paramedic of [place];Living will  Copy of [place] in Chart? Yes  Would patient like information on creating a medical advance directive?     [place], [place], [facility] Pager [phone] Voicemail [phone]

## 2017-01-22 NOTE — Patient Instructions (Signed)
[facility] Discharge Instructions for Patients Receiving Chemotherapy  Today you received the following chemotherapy agents Herceptin  To help prevent nausea and vomiting after your treatment, we encourage you to take your nausea medication    If you develop nausea and vomiting that is not controlled by your nausea medication, call the clinic.   BELOW ARE SYMPTOMS THAT SHOULD BE REPORTED IMMEDIATELY:  *FEVER GREATER THAN 100.5 F  *CHILLS WITH OR WITHOUT FEVER  NAUSEA AND VOMITING THAT IS NOT CONTROLLED WITH YOUR NAUSEA MEDICATION  *UNUSUAL SHORTNESS OF BREATH  *UNUSUAL BRUISING OR BLEEDING  TENDERNESS IN MOUTH AND THROAT WITH OR WITHOUT PRESENCE OF ULCERS  *URINARY PROBLEMS  *BOWEL PROBLEMS  UNUSUAL RASH Items with * indicate a potential emergency and should be followed up as soon as possible.  Feel free to call the clinic you have any questions or concerns. The clinic phone number is [phone].  Please show the CHEMO ALERT CARD at check-in to the Emergency Department and triage nurse.   

## 2017-01-22 NOTE — Telephone Encounter (Signed)
[name], I had completed the PCS form at her OV and handed to you to fax as well as inform her case manager to pick up the original; could you please follow up on this? Thank you.

## 2017-01-23 LAB — CANCER ANTIGEN 27.29: CA 27.29: 10.9 U/mL (ref 0.0–38.6)

## 2017-01-23 LAB — LITHIUM LEVEL: Lithium: 1.5 mmol/L — ABNORMAL HIGH (ref 0.6–1.2)

## 2017-01-25 ENCOUNTER — Telehealth: Payer: Self-pay | Admitting: Family Medicine

## 2017-01-25 ENCOUNTER — Encounter: Payer: Self-pay | Admitting: Adult Health

## 2017-01-25 ENCOUNTER — Telehealth: Payer: Self-pay | Admitting: *Deleted

## 2017-01-25 DIAGNOSIS — C50312 Malignant neoplasm of lower-inner quadrant of left female breast: Secondary | ICD-10-CM

## 2017-01-25 NOTE — Telephone Encounter (Signed)
Pt. Called requesting to speak with her nurse regarding her personal care aide paperwork.  Please f/u with pt.

## 2017-01-25 NOTE — Telephone Encounter (Signed)
Writer filled out a new PCS form, MD signed it and it was faxed today.  Transmission log received that it did go through.  Writer also faxed a DME order to [facility] for a shower chair.

## 2017-01-25 NOTE — Telephone Encounter (Signed)
Returned call to pt regarding her concern for a rehab referral for balance and vestibular outpt rehab.

## 2017-01-25 NOTE — Telephone Encounter (Signed)
Writer spoke with patient regarding paperwork that was completed and faxed ou to [facility] this morning and the request for DME faxed to [place].

## 2017-02-01 ENCOUNTER — Encounter: Payer: Self-pay | Admitting: *Deleted

## 2017-02-03 ENCOUNTER — Telehealth: Payer: Self-pay | Admitting: *Deleted

## 2017-02-03 ENCOUNTER — Ambulatory Visit: Payer: Medicaid Other | Attending: Oncology | Admitting: Physical Therapy

## 2017-02-03 NOTE — Telephone Encounter (Signed)
Pt called to relate she "lost all that stuff you gave me yesterday". Went over support service programs and classes. Informed pt I would have the support services team mail her the information as well. Pt very appreciative. Denies further needs at this time. Gave pt emotional support and encouragement.

## 2017-02-04 ENCOUNTER — Telehealth: Payer: Self-pay | Admitting: *Deleted

## 2017-02-08 ENCOUNTER — Encounter: Payer: Self-pay | Admitting: *Deleted

## 2017-02-10 ENCOUNTER — Encounter: Payer: Self-pay | Admitting: *Deleted

## 2017-02-10 NOTE — Progress Notes (Signed)
[place] Work  Clinical Social Work was referred by Marine scientist for check in re. support group and re-assessment of psychosocial needs.  Clinical Social Worker contacted patient at home to offer support and assess for needs.  Pt sounded a little scattered, shared she had been shaking worse. She has an appointment this Friday. CSW encouraged pt to attend [facility] group. CSW had discussed this group with pt in the past. Pt would like to attend. CSW to follow up with pt at treatment and provide dates and times for group again, as she lost previous handout and can't write currently. CSW to follow.   Clinical Social Work interventions:  Check in [place], [place], [place] Clinical Social Worker [place]  [place] Phone: [phone] Fax: [phone]

## 2017-02-12 ENCOUNTER — Encounter: Payer: Self-pay | Admitting: *Deleted

## 2017-02-12 ENCOUNTER — Encounter: Payer: Self-pay | Admitting: Adult Health

## 2017-02-12 ENCOUNTER — Ambulatory Visit: Payer: Medicaid Other

## 2017-02-12 ENCOUNTER — Emergency Department (HOSPITAL_COMMUNITY)
Admission: EM | Admit: 2017-02-12 | Discharge: 2017-02-13 | Disposition: A | Discharge status: Home / Self Care | Payer: Medicaid Other | Attending: Emergency Medicine | Admitting: Emergency Medicine

## 2017-02-12 ENCOUNTER — Ambulatory Visit (HOSPITAL_BASED_OUTPATIENT_CLINIC_OR_DEPARTMENT_OTHER): Payer: Medicaid Other | Admitting: Adult Health

## 2017-02-12 ENCOUNTER — Ambulatory Visit (HOSPITAL_BASED_OUTPATIENT_CLINIC_OR_DEPARTMENT_OTHER): Payer: Medicaid Other

## 2017-02-12 ENCOUNTER — Other Ambulatory Visit (HOSPITAL_BASED_OUTPATIENT_CLINIC_OR_DEPARTMENT_OTHER): Payer: Medicaid Other

## 2017-02-12 ENCOUNTER — Encounter (HOSPITAL_COMMUNITY): Payer: Self-pay | Admitting: *Deleted

## 2017-02-12 VITALS — BP 126/73 | HR 101 | Temp 97.9°F | Resp 18 | Ht 65.0 in | Wt 132.2 lb

## 2017-02-12 VITALS — BP 126/82 | HR 98 | Resp 18

## 2017-02-12 DIAGNOSIS — C7931 Secondary malignant neoplasm of brain: Secondary | ICD-10-CM

## 2017-02-12 DIAGNOSIS — C773 Secondary and unspecified malignant neoplasm of axilla and upper limb lymph nodes: Secondary | ICD-10-CM | POA: Diagnosis not present

## 2017-02-12 DIAGNOSIS — C7951 Secondary malignant neoplasm of bone: Secondary | ICD-10-CM | POA: Diagnosis not present

## 2017-02-12 DIAGNOSIS — F419 Anxiety disorder, unspecified: Secondary | ICD-10-CM | POA: Diagnosis not present

## 2017-02-12 DIAGNOSIS — F99 Mental disorder, not otherwise specified: Secondary | ICD-10-CM | POA: Diagnosis not present

## 2017-02-12 DIAGNOSIS — Z85841 Personal history of malignant neoplasm of brain: Secondary | ICD-10-CM | POA: Diagnosis not present

## 2017-02-12 DIAGNOSIS — Z853 Personal history of malignant neoplasm of breast: Secondary | ICD-10-CM | POA: Diagnosis not present

## 2017-02-12 DIAGNOSIS — F3130 Bipolar disorder, current episode depressed, mild or moderate severity, unspecified: Secondary | ICD-10-CM | POA: Diagnosis present

## 2017-02-12 DIAGNOSIS — R4182 Altered mental status, unspecified: Secondary | ICD-10-CM | POA: Diagnosis present

## 2017-02-12 DIAGNOSIS — C50312 Malignant neoplasm of lower-inner quadrant of left female breast: Secondary | ICD-10-CM

## 2017-02-12 DIAGNOSIS — Z818 Family history of other mental and behavioral disorders: Secondary | ICD-10-CM | POA: Diagnosis not present

## 2017-02-12 DIAGNOSIS — Z5112 Encounter for antineoplastic immunotherapy: Secondary | ICD-10-CM | POA: Diagnosis not present

## 2017-02-12 DIAGNOSIS — Z79899 Other long term (current) drug therapy: Secondary | ICD-10-CM | POA: Diagnosis not present

## 2017-02-12 DIAGNOSIS — F17210 Nicotine dependence, cigarettes, uncomplicated: Secondary | ICD-10-CM | POA: Insufficient documentation

## 2017-02-12 DIAGNOSIS — R269 Unspecified abnormalities of gait and mobility: Secondary | ICD-10-CM | POA: Diagnosis not present

## 2017-02-12 DIAGNOSIS — C50919 Malignant neoplasm of unspecified site of unspecified female breast: Secondary | ICD-10-CM

## 2017-02-12 LAB — URINALYSIS, ROUTINE W REFLEX MICROSCOPIC
Bilirubin Urine: NEGATIVE
Glucose, UA: NEGATIVE mg/dL
Ketones, ur: 5 mg/dL — AB
Nitrite: NEGATIVE
Protein, ur: NEGATIVE mg/dL
Specific Gravity, Urine: 1.003 — ABNORMAL LOW (ref 1.005–1.030)
pH: 5 (ref 5.0–8.0)

## 2017-02-12 LAB — CBC WITH DIFFERENTIAL/PLATELET
BASO%: 0.4 % (ref 0.0–2.0)
Basophils Absolute: 0 10*3/uL (ref 0.0–0.1)
EOS%: 1.5 % (ref 0.0–7.0)
Eosinophils Absolute: 0.1 10*3/uL (ref 0.0–0.5)
HCT: 39.3 % (ref 34.8–46.6)
HGB: 13.1 g/dL (ref 11.6–15.9)
LYMPH%: 11.8 % — ABNORMAL LOW (ref 14.0–49.7)
MCH: 32 pg (ref 25.1–34.0)
MCHC: 33.4 g/dL (ref 31.5–36.0)
MCV: 96 fL (ref 79.5–101.0)
MONO#: 0.4 10*3/uL (ref 0.1–0.9)
MONO%: 7.8 % (ref 0.0–14.0)
NEUT#: 4.5 10*3/uL (ref 1.5–6.5)
NEUT%: 78.5 % — ABNORMAL HIGH (ref 38.4–76.8)
Platelets: 224 10*3/uL (ref 145–400)
RBC: 4.1 10*6/uL (ref 3.70–5.45)
RDW: 12.5 % (ref 11.2–14.5)
WBC: 5.8 10*3/uL (ref 3.9–10.3)
lymph#: 0.7 10*3/uL — ABNORMAL LOW (ref 0.9–3.3)

## 2017-02-12 LAB — COMPREHENSIVE METABOLIC PANEL
ALT: 13 U/L (ref 0–55)
AST: 14 U/L (ref 5–34)
Albumin: 4.2 g/dL (ref 3.5–5.0)
Alkaline Phosphatase: 50 U/L (ref 40–150)
Anion Gap: 9 mEq/L (ref 3–11)
BUN: 4.6 mg/dL — ABNORMAL LOW (ref 7.0–26.0)
CO2: 23 mEq/L (ref 22–29)
Calcium: 10.2 mg/dL (ref 8.4–10.4)
Chloride: 107 mEq/L (ref 98–109)
Creatinine: 0.8 mg/dL (ref 0.6–1.1)
EGFR: 81 mL/min/{1.73_m2} — ABNORMAL LOW (ref >90)
Glucose: 106 mg/dl (ref 70–140)
Potassium: 4 mEq/L (ref 3.5–5.1)
Sodium: 139 mEq/L (ref 136–145)
Total Bilirubin: 0.27 mg/dL (ref 0.20–1.20)
Total Protein: 6.6 g/dL (ref 6.4–8.3)

## 2017-02-12 LAB — RAPID URINE DRUG SCREEN, HOSP PERFORMED
Amphetamines: NOT DETECTED
Barbiturates: NOT DETECTED
Benzodiazepines: POSITIVE — AB
Cocaine: NOT DETECTED
Opiates: NOT DETECTED
Tetrahydrocannabinol: NOT DETECTED

## 2017-02-12 LAB — ETHANOL: Alcohol, Ethyl (B): <5 mg/dL (ref <5)

## 2017-02-12 LAB — LITHIUM LEVEL: Lithium Lvl: 1.01 mmol/L (ref 0.60–1.20)

## 2017-02-12 MED ORDER — DIPHENOXYLATE-ATROPINE 2.5-0.025 MG PO TABS: 1.0000 | ORAL_TABLET | Freq: Four times a day (QID) | ORAL | Status: DC | PRN

## 2017-02-12 MED ORDER — ACETAMINOPHEN 325 MG PO TABS
650.0000 mg | ORAL_TABLET | Freq: Once | ORAL | Status: AC
  Administered 2017-02-12: 650 mg via ORAL

## 2017-02-12 MED ORDER — HEPARIN SOD (PORK) LOCK FLUSH 100 UNIT/ML IV SOLN
500.0000 [IU] | Freq: Once | INTRAVENOUS | Status: DC | PRN
  Filled 2017-02-12: qty 5

## 2017-02-12 MED ORDER — GABAPENTIN 300 MG PO CAPS
600.0000 mg | ORAL_CAPSULE | Freq: Three times a day (TID) | ORAL | Status: DC
  Administered 2017-02-12 – 2017-02-13 (×3): 600 mg via ORAL
  Filled 2017-02-12 (×3): qty 2

## 2017-02-12 MED ORDER — DENOSUMAB 120 MG/1.7ML ~~LOC~~ SOLN
120.0000 mg | Freq: Once | SUBCUTANEOUS | Status: AC
  Administered 2017-02-12: 120 mg via SUBCUTANEOUS
  Filled 2017-02-12: qty 1.7

## 2017-02-12 MED ORDER — TRAZODONE HCL 100 MG PO TABS
300.0000 mg | ORAL_TABLET | Freq: Every day | ORAL | Status: DC
  Administered 2017-02-12: 300 mg via ORAL
  Filled 2017-02-12: qty 3

## 2017-02-12 MED ORDER — PANTOPRAZOLE SODIUM 40 MG PO TBEC
40.0000 mg | DELAYED_RELEASE_TABLET | Freq: Every day | ORAL | Status: DC
  Administered 2017-02-12 – 2017-02-13 (×2): 40 mg via ORAL
  Filled 2017-02-12 (×2): qty 1

## 2017-02-12 MED ORDER — ANASTROZOLE 1 MG PO TABS
1.0000 mg | ORAL_TABLET | Freq: Every day | ORAL | Status: DC
  Administered 2017-02-12 – 2017-02-13 (×2): 1 mg via ORAL
  Filled 2017-02-12 (×2): qty 1

## 2017-02-12 MED ORDER — POTASSIUM CHLORIDE CRYS ER 10 MEQ PO TBCR
10.0000 meq | EXTENDED_RELEASE_TABLET | Freq: Every day | ORAL | Status: DC
  Administered 2017-02-12 – 2017-02-13 (×2): 10 meq via ORAL
  Filled 2017-02-12 (×2): qty 1

## 2017-02-12 MED ORDER — LORAZEPAM 0.5 MG PO TABS
0.5000 mg | ORAL_TABLET | Freq: Three times a day (TID) | ORAL | Status: DC | PRN
  Administered 2017-02-12 – 2017-02-13 (×2): 0.5 mg via ORAL
  Filled 2017-02-12 (×2): qty 1

## 2017-02-12 MED ORDER — BIOTIN 1 MG PO CAPS: 1.0000 mg | ORAL_CAPSULE | Freq: Every day | ORAL | Status: DC

## 2017-02-12 MED ORDER — SODIUM CHLORIDE 0.9 % IV SOLN
Freq: Once | INTRAVENOUS | Status: AC
  Administered 2017-02-12: 10:00:00 via INTRAVENOUS

## 2017-02-12 MED ORDER — DIPHENHYDRAMINE HCL 25 MG PO CAPS
ORAL_CAPSULE | ORAL | Status: AC
  Filled 2017-02-12: qty 2

## 2017-02-12 MED ORDER — LORATADINE 10 MG PO TABS
10.0000 mg | ORAL_TABLET | Freq: Every day | ORAL | Status: DC
  Administered 2017-02-13: 10 mg via ORAL
  Filled 2017-02-12: qty 1

## 2017-02-12 MED ORDER — DIPHENHYDRAMINE HCL 25 MG PO CAPS
25.0000 mg | ORAL_CAPSULE | Freq: Once | ORAL | Status: AC
  Administered 2017-02-12: 25 mg via ORAL

## 2017-02-12 MED ORDER — LAMOTRIGINE 25 MG PO TABS
50.0000 mg | ORAL_TABLET | Freq: Two times a day (BID) | ORAL | Status: DC
  Administered 2017-02-12 – 2017-02-13 (×2): 50 mg via ORAL
  Filled 2017-02-12 (×2): qty 2

## 2017-02-12 MED ORDER — LAMOTRIGINE 25 MG PO TABS: 150.0000 mg | ORAL_TABLET | Freq: Two times a day (BID) | ORAL | Status: DC

## 2017-02-12 MED ORDER — TRAMADOL HCL 50 MG PO TABS
50.0000 mg | ORAL_TABLET | Freq: Four times a day (QID) | ORAL | Status: DC | PRN
  Administered 2017-02-13: 50 mg via ORAL
  Filled 2017-02-12: qty 1

## 2017-02-12 MED ORDER — LORAZEPAM 1 MG PO TABS
1.0000 mg | ORAL_TABLET | Freq: Once | ORAL | Status: AC
  Administered 2017-02-12: 1 mg via ORAL
  Filled 2017-02-12: qty 1

## 2017-02-12 MED ORDER — ACETAMINOPHEN 325 MG PO TABS
ORAL_TABLET | ORAL | Status: AC
Start: 2017-02-12 — End: 2017-02-12
  Filled 2017-02-12: qty 2

## 2017-02-12 MED ORDER — SODIUM CHLORIDE 0.9 % IV SOLN
6.0000 mg/kg | Freq: Once | INTRAVENOUS | Status: AC
  Administered 2017-02-12: 357 mg via INTRAVENOUS
  Filled 2017-02-12: qty 17

## 2017-02-12 MED ORDER — TRASTUZUMAB CHEMO 150 MG IV SOLR: 6.0000 mg/kg | Freq: Once | INTRAVENOUS | Status: DC

## 2017-02-12 MED ORDER — SODIUM CHLORIDE 0.9 % IV SOLN
1000.0000 mL | Freq: Once | INTRAVENOUS | Status: AC
  Administered 2017-02-12: 1000 mL via INTRAVENOUS

## 2017-02-12 MED ORDER — SODIUM CHLORIDE 0.9% FLUSH
10.0000 mL | INTRAVENOUS | Status: DC | PRN
  Administered 2017-02-12: 10 mL
  Filled 2017-02-12: qty 10

## 2017-02-12 MED ORDER — IBUPROFEN 200 MG PO TABS: 600.0000 mg | ORAL_TABLET | Freq: Four times a day (QID) | ORAL | Status: DC | PRN

## 2017-02-12 MED ORDER — VITAMIN D3 25 MCG (1000 UNIT) PO TABS
1000.0000 [IU] | ORAL_TABLET | Freq: Every day | ORAL | Status: DC
  Administered 2017-02-13: 1000 [IU] via ORAL
  Filled 2017-02-12: qty 1

## 2017-02-12 MED ORDER — LITHIUM CARBONATE 300 MG PO CAPS
300.0000 mg | ORAL_CAPSULE | Freq: Two times a day (BID) | ORAL | Status: DC
  Administered 2017-02-12 – 2017-02-13 (×2): 300 mg via ORAL
  Filled 2017-02-12 (×2): qty 1

## 2017-02-12 MED ORDER — ONDANSETRON HCL 4 MG PO TABS
8.0000 mg | ORAL_TABLET | Freq: Three times a day (TID) | ORAL | Status: DC | PRN
  Administered 2017-02-12: 8 mg via ORAL
  Filled 2017-02-12: qty 2

## 2017-02-12 MED ORDER — HEPARIN SOD (PORK) LOCK FLUSH 100 UNIT/ML IV SOLN
500.0000 [IU] | Freq: Once | INTRAVENOUS | Status: AC
  Administered 2017-02-13: 500 [IU]
  Filled 2017-02-12: qty 5

## 2017-02-12 NOTE — ED Notes (Signed)
Pt given phone to make calls, and could not remember the correct phone numbers of family members.

## 2017-02-12 NOTE — ED Notes (Signed)
Dr. Roderic Palau at bedside

## 2017-02-12 NOTE — ED Provider Notes (Addendum)
[place] [facility] DEPT Provider Note   CSN: [id number] Arrival date & time: 02/12/17  1226     History   Chief Complaint Chief Complaint  Patient presents with  . Medical Clearance    HPI [name] is a 56 y.o. female.  Patient was seen at oncology today. It was noticed by her physician that the patient seems depressed anxious and confused. The oncologist feels this is all psychiatric related. He felt she needed evaluation by psychiatry   The history is provided by a caregiver and the patient. No language interpreter was used.  Altered Mental Status   This is a recurrent problem. The current episode started more than 2 days ago. The problem has not changed since onset.Pertinent negatives include no seizures and no hallucinations. Risk factors: Metastatic cancer. Her past medical history does not include seizures.    Past Medical History:  Diagnosis Date  . Alcohol abuse   . Anemia    during chemo  . Anxiety    At age 61  . Arthritis Dx 2010  . Bipolar disorder ([place])   . Cancer ([place])    breast mets to brain  . Chronic pain   . Complication of anesthesia   . Depression   . Fibromyalgia Dx 2005  . GERD (gastroesophageal reflux disease)   . Headache    hx  migraines  . Opiate dependence ([place])   . PONV (postoperative nausea and vomiting)   . Port-a-cath in place   . PTSD (post-traumatic stress disorder)     Patient Active Problem List   Diagnosis Date Noted  . Gait abnormality 01/06/2017  . Bipolar I disorder, most recent episode depressed ([place]) 12/08/2016  . Adjustment disorder with anxiety 12/06/2016  . Delirium due to another medical condition 12/03/2016  . Acute encephalopathy 12/03/2016  . Diarrhea 12/03/2016  . Metastatic breast cancer ([place])   . Cerumen impaction 11/25/2016  . Otitis media 11/06/2016  . Brain metastasis ([place]) 07/27/2016  . Iron deficiency anemia 06/26/2016  . Bone metastases ([place]) 06/03/2016  . Primary cancer of lower-inner quadrant of  left female breast ([place]) 06/01/2016  . Pap smear for cervical cancer screening 03/28/2015  . Current smoker 03/28/2015  . Healthcare maintenance 03/28/2015  . Seasonal allergies 03/28/2015  . Anxiety state 02/28/2015  . Fibromyalgia 02/28/2015  . Family history of diabetes mellitus 02/28/2015  . H/O alcohol abuse     Past Surgical History:  Procedure Laterality Date  . APPLICATION OF CRANIAL NAVIGATION N/A 08/14/2016   Procedure: APPLICATION OF CRANIAL NAVIGATION;  Surgeon: Erline Levine, MD;  Location: [place] [facility];  Service: Neurosurgery;  Laterality: N/A;  . BREAST RECONSTRUCTION Left    with silicone implant  . CRANIOTOMY N/A 08/14/2016   Procedure: CRANIOTOMY TUMOR EXCISION WITH Lucky Rathke;  Surgeon: Erline Levine, MD;  Location: [place] [facility]UR[facility];  Service: Neurosurgery;  Laterality: N/A;  . FIBULA FRACTURE SURGERY Left   . MASTECTOMY Left   . RADIOLOGY WITH ANESTHESIA N/A 07/23/2016   Procedure: MRI OF BRAIN WITH AND WITHOUT;  Surgeon: Medication Radiologist, MD;  Location: [facility];  Service: Radiology;  Laterality: N/A;  . RADIOLOGY WITH ANESTHESIA N/A 09/08/2016   Procedure: MRI OF BRAIN WITH AND WITHOUT CONTRAST;  Surgeon: Medication Radiologist, MD;  Location: [facility];  Service: Radiology;  Laterality: N/A;  . RADIOLOGY WITH ANESTHESIA N/A 12/10/2016   Procedure: MRI OF BRAIN WITH AND WITHOUT;  Surgeon: Medication Radiologist, MD;  Location: [facility];  Service: Radiology;  Laterality: N/A;  . right power  port placement Right     OB History    No data available       Home Medications    Prior to Admission medications   Medication Sig Start Date End Date Taking? Authorizing Provider  anastrozole (ARIMIDEX) 1 MG tablet Take 1 tablet (1 mg total) by mouth daily. 08/25/16   Chauncey Cruel, MD  Biotin 1 MG CAPS Take 1 mg by mouth daily.    Historical Provider, MD  cetirizine (ZYRTEC) 10 MG tablet Take 1 tablet (10 mg total) by mouth daily. 07/14/16   Chauncey Cruel, MD    cholecalciferol (VITAMIN D) 1000 units tablet Take 1,000 Units by mouth daily.    Historical Provider, MD  diphenoxylate-atropine (LOMOTIL) 2.5-0.025 MG tablet Take 1 tablet by mouth 4 (four) times daily as needed for diarrhea or loose stools. 01/22/17   Gardenia Phlegm, NP  fluticasone (FLONASE) 50 MCG/ACT nasal spray Place 2 sprays into both nostrils daily as needed for rhinitis.    Historical Provider, MD  gabapentin (NEURONTIN) 300 MG capsule Take 600 mg by mouth 3 (three) times daily.    Historical Provider, MD  ibuprofen (ADVIL,MOTRIN) 200 MG tablet Take 800 mg by mouth every 6 (six) hours as needed for headache, mild pain or moderate pain.     Historical Provider, MD  lamoTRIgine (LAMICTAL) 100 MG tablet Take 1.5 tablets (150 mg total) by mouth 2 (two) times daily. 11/11/16   Chauncey Cruel, MD  lidocaine-prilocaine (EMLA) cream Apply 1 application topically as needed (prior to accessing port).    Historical Provider, MD  lithium carbonate 300 MG capsule Take 300 mg by mouth 2 (two) times daily with a meal.    Historical Provider, MD  LORazepam (ATIVAN) 0.5 MG tablet Take 1 tablet (0.5 mg total) by mouth every 8 (eight) hours as needed for anxiety. 01/22/17   Gardenia Phlegm, NP  Multiple Vitamin (MULTIVITAMIN WITH MINERALS) TABS tablet Take 1 tablet by mouth daily.    Historical Provider, MD  ondansetron (ZOFRAN) 8 MG tablet Take 1 tablet (8 mg total) by mouth every 8 (eight) hours as needed for nausea or vomiting. 01/19/17   Chauncey Cruel, MD  pantoprazole (PROTONIX) 40 MG tablet Take 40 mg by mouth daily.    Historical Provider, MD  potassium chloride (MICRO-K) 10 MEQ CR capsule Take 1 capsule (10 mEq total) by mouth daily. 01/06/17   Arnoldo Morale, MD  traMADol (ULTRAM) 50 MG tablet Take 1 tablet (50 mg total) by mouth every 6 (six) hours as needed for moderate pain. 01/22/17   Gardenia Phlegm, NP  traZODone (DESYREL) 150 MG tablet Take 300 mg by mouth at bedtime.     Historical Provider, MD  varenicline (CHANTIX) 0.5 MG tablet Take 1 tablet (0.5 mg total) by mouth daily. Patient not taking: Reported on 01/22/2017 11/11/16   Chauncey Cruel, MD    Family History Family History  Problem Relation Age of Onset  . Diabetes Mother   . Bipolar disorder Mother   . CAD Father     Social History Social History  Substance Use Topics  . Smoking status: Current Every Day Smoker    Packs/day: 0.50    Types: Cigarettes  . Smokeless tobacco: Never Used     Comment: Pt is on Chantix at present time  . Alcohol use No     Comment: no ETOH since 08/22/12     Allergies   Demerol and Erythromycin   Review of  Systems Review of Systems  Constitutional: Negative for appetite change and fatigue.  HENT: Negative for congestion, ear discharge and sinus pressure.   Eyes: Negative for discharge.  Respiratory: Negative for cough.   Cardiovascular: Negative for chest pain.  Gastrointestinal: Negative for abdominal pain and diarrhea.  Genitourinary: Negative for frequency and hematuria.  Musculoskeletal: Negative for back pain.  Skin: Negative for rash.  Neurological: Negative for seizures and headaches.  Psychiatric/Behavioral: Positive for dysphoric mood. Negative for hallucinations.     Physical Exam Updated Vital Signs BP 130/81 (BP Location: Left Arm)   Pulse 100   Temp 97.9 F (36.6 C) (Oral)   Resp 18   SpO2 95%   Physical Exam  Constitutional: She is oriented to person, place, and time. She appears well-developed.  HENT:  Head: Normocephalic.  Eyes: Conjunctivae and EOM are normal. No scleral icterus.  Neck: Neck supple. No thyromegaly present.  Cardiovascular: Normal rate and regular rhythm.  Exam reveals no gallop and no friction rub.   No murmur heard. Pulmonary/Chest: No stridor. She has no wheezes. She has no rales. She exhibits no tenderness.  Abdominal: She exhibits no distension. There is no tenderness. There is no rebound.   Musculoskeletal: Normal range of motion. She exhibits no edema.  Lymphadenopathy:    She has no cervical adenopathy.  Neurological: She is oriented to person, place, and time. She exhibits normal muscle tone. Coordination normal.  Skin: No rash noted. No erythema.  Psychiatric:  Patient is depressed but not suicidal or homicidal she is also very anxious     ED Treatments / Results  Labs (all labs ordered are listed, but only abnormal results are displayed) Labs Reviewed  URINALYSIS, [place], HOSP PERFORMED    EKG  EKG Interpretation None       Radiology No results found.  Procedures Procedures (including critical care time)  Medications Ordered in ED Medications  LORazepam (ATIVAN) tablet 1 mg (not administered)     Initial Impression / Assessment and Plan / ED Course  I have reviewed the triage vital signs and the nursing notes.  Pertinent labs & imaging results that were available during my care of the patient were reviewed by me and considered in my medical decision making (see chart for details).     Patient had a CBC and cmet at the cancer center today that were normal. The patient's oncologist felt like the patient needed psychiatric evaluation. He stated her cancer is almost completely cured and is not the reason that she's acting like this Pt will be admitted to psyc for depression and anxiety Final Clinical Impressions(s) / ED Diagnoses   Final diagnoses:  None    New Prescriptions New Prescriptions   No medications on file     Milton Ferguson, MD 02/12/17 [place], MD 02/12/17 1542

## 2017-02-12 NOTE — ED Notes (Signed)
Labs cmp, cbc drawn in cancer center; urine sample requested

## 2017-02-12 NOTE — BH Assessment (Addendum)
Assessment Note  [name] is an 56 y.o. female. Pt presents voluntarily to [facility] from [place] for [facility] T[facility] assessment. Per notes from the [place] patient to be assessed for psychosocial needs to psych concerns, possible care issues at home and care coordination. Per notes  patient has a medical history of metastatic breast cancer to bone and brain, depression, fibromyalgia, remote alcohol abuse, bipolar disorder, and chronic pain syndrome.   Writer met with patient face to face. She stated, "Look.I am just simply tired of being like this.I have fought cancer for 6.5 yrs.and I'm just tired". Patient refused to answer any further questions. Writer observed patient to be restless, laying down one moment to pacing the room, and crying on/off. Wrier unable to confirm or deny suicidal ideations. Per recent/prior ED notes, patient denies any history of suicide attempts, or of self-mutilation. Unable to confirm or deny current homicidal ideations.  No prior documentation of homicidal thoughts or physical aggression. Unknown if patient has current access to firearms. Past history indicates that patient denies having access to firearms. Pt does not have any documented legal problems at this time.   Patent refused to provide insight on her ADL's. Per recent/past notes patient is unable to get her meals independently, has not been able to perform hygiene, and unsteady on feet. Patient did not provide any insight regarding assistive devices that she may or may need or use.   Pt is restless and uncooperative during assessment. Writer unable to confirm or deny hallucinations. Pt does not appear to be responding to internal stimuli and exhibits no delusional thought. Pt's reality testing appears to be intact. She is oriented to person, place, and situation but she doesn't know the current month or year. She says, "I'm confused as far as my medications go." Writer is unclear if patient is at  all compliant with her medications or able to manage her medications independently. She is trying to find a current psychiatrist or therapist.  Per notes patient's POA is her son [name] [phone].  Per prior notes patient has a dx of Bipolar disorder. She has stated in the past that she went to [facility] for a long time for med management and group therapy. Pt sts she hasn't been to [facility] in several mos. She reports no history of inpatient psych admissions. Writer unable to confirm or deny current substance use. Patient's alcohol or drug use history is unknown. Pt does not appear to be intoxicated or in withdrawal at this time.   Diagnosis: Bipolar (per notes)  Past Medical History:  Past Medical History:  Diagnosis Date  . Alcohol abuse   . Anemia    during chemo  . Anxiety    At age 59  . Arthritis Dx 2010  . Bipolar disorder ([place])   . Cancer ([place])    breast mets to brain  . Chronic pain   . Complication of anesthesia   . Depression   . Fibromyalgia Dx 2005  . GERD (gastroesophageal reflux disease)   . Headache    hx  migraines  . Opiate dependence ([place])   . PONV (postoperative nausea and vomiting)   . Port-a-cath in place   . PTSD (post-traumatic stress disorder)     Past Surgical History:  Procedure Laterality Date  . APPLICATION OF CRANIAL NAVIGATION N/A 08/14/2016   Procedure: APPLICATION OF CRANIAL NAVIGATION;  Surgeon: Erline Levine, MD;  Location: [facility];  Service: Neurosurgery;  Laterality: N/A;  . BREAST RECONSTRUCTION Left    with silicone implant  . CRANIOTOMY N/A 08/14/2016   Procedure: CRANIOTOMY TUMOR EXCISION WITH Lucky Rathke;  Surgeon: Erline Levine, MD;  Location: [facility];  Service: Neurosurgery;  Laterality: N/A;  . FIBULA FRACTURE SURGERY Left   . MASTECTOMY Left   . RADIOLOGY WITH ANESTHESIA N/A 07/23/2016   Procedure: MRI OF BRAIN WITH AND WITHOUT;  Surgeon: Medication Radiologist, MD;  Location: [facility];  Service:  Radiology;  Laterality: N/A;  . RADIOLOGY WITH ANESTHESIA N/A 09/08/2016   Procedure: MRI OF BRAIN WITH AND WITHOUT CONTRAST;  Surgeon: Medication Radiologist, MD;  Location: [facility];  Service: Radiology;  Laterality: N/A;  . RADIOLOGY WITH ANESTHESIA N/A 12/10/2016   Procedure: MRI OF BRAIN WITH AND WITHOUT;  Surgeon: Medication Radiologist, MD;  Location: [facility];  Service: Radiology;  Laterality: N/A;  . right power port placement Right     Family History:  Family History  Problem Relation Age of Onset  . Diabetes Mother   . Bipolar disorder Mother   . CAD Father     Social History:  reports that she has been smoking Cigarettes.  She has been smoking about 0.50 packs per day. She has never used smokeless tobacco. She reports that she does not drink alcohol or use drugs.  Additional Social History:  Alcohol / Drug Use Pain Medications: pt denies abuse - see pta meds list Prescriptions: pt denies abuse - see pta meds list Over the Counter: pt denies abuse - see pta meds list History of alcohol / drug use?: No history of alcohol / drug abuse Longest period of sobriety (when/how long): Current  CIWA: CIWA-Ar BP: 130/81 Pulse Rate: 100 COWS:    Allergies:  Allergies  Allergen Reactions  . Demerol Itching and Nausea And Vomiting  . Erythromycin Rash    Home Medications:  (Not in a hospital admission)  OB/GYN Status:  No LMP recorded. Patient has had an ablation.  General Assessment Data Location of Assessment: [facility] TTS Assessment: In system Is this a Tele or Face-to-Face Assessment?: Face-to-Face Is this an Initial Assessment or a Re-assessment for this encounter?: Initial Assessment Marital status: Divorced [place] name:  (n/a) Is patient pregnant?: No Pregnancy Status: No Living Arrangements: Non-relatives/Friends Can pt return to current living arrangement?: Yes Admission Status: Voluntary Is patient capable of signing voluntary admission?: Yes Referral Source:  Self/Family/Friend Insurance type:  (Medicaid )  Medical Screening Exam ([place]) Medical Exam completed: Yes  Crisis Care Plan Living Arrangements: Non-relatives/Friends Name of Psychiatrist: None Name of Therapist: None  Education Status Is patient currently in school?: Yes Current Grade:  (n/a) Highest grade of school patient has completed:  (12) Name of school: na Contact person: na  Risk to self with the past 6 months Suicidal Ideation:  (patient did not confirm or deny) Has patient been a risk to self within the past 6 months prior to admission? : Other (comment) (patient is calm and cooperative ) Suicidal Intent:  (unk; pt refused) Has patient had any suicidal intent within the past 6 months prior to admission? : No Is patient at risk for suicide?: Yes Suicidal Plan?:  (unk; pt refused to answer) Has patient had any suicidal plan within the past 6 months prior to admission? : No Access to Means:  (unk; patient refused to answer) What has been your use of drugs/alcohol within the last 12 months?:  (patient denies; past notes indicate that she is in recovery)  Previous Attempts/Gestures: No How many times?:  (0) Other Self Harm Risks:  (none reported) Triggers for Past Attempts: Unknown Intentional Self Injurious Behavior: None Family Suicide History: No Recent stressful life event(s): Other (Comment) (unk) Persecutory voices/beliefs?: No Depression: Yes Depression Symptoms:  (patient refused to answer) Substance abuse history and/or treatment for substance abuse?: Yes Suicide prevention information given to non-admitted patients: Not applicable  Risk to Others within the past 6 months Homicidal Ideation: No Thoughts of Harm to Others: No Current Homicidal Intent: No Current Homicidal Plan: No Access to Homicidal Means: No Identified Victim:  (n/a) History of harm to others?: No Assessment of Violence: None Noted Violent Behavior Description:  (patient  is calm and cooperative ) Does patient have access to weapons?: No Criminal Charges Pending?: No Does patient have a court date: No Is patient on probation?: No  Psychosis Hallucinations: None noted Delusions: None noted  Mental Status Report Appearance/Hygiene: In scrubs Eye Contact: Poor Motor Activity: Agitation, Restlessness Speech: Incoherent, Soft Level of Consciousness: Restless, Irritable, Alert Mood: Depressed, Anxious, Apprehensive, Irritable, Preoccupied, Helpless Affect: Angry, Anxious Anxiety Level: Moderate Thought Processes: Flight of Ideas Judgement: Impaired Orientation: Place, Person Obsessive Compulsive Thoughts/Behaviors: None  Cognitive Functioning Concentration: Decreased Memory: Remote Intact, Recent Intact IQ: Average Insight: Poor Impulse Control: Fair Appetite:  (unk) Weight Loss:  (unk) Weight Gain:  (unk) Sleep: Unable to Assess Total Hours of Sleep:  (unk) Vegetative Symptoms: Unable to Assess  ADLScreening [facility] Assessment Services) Patient's cognitive ability adequate to safely complete daily activities?: Yes Patient able to express need for assistance with ADLs?: Yes Independently performs ADLs?: Yes (appropriate for developmental age)  Prior Inpatient Therapy Prior Inpatient Therapy: Yes Prior Therapy Dates: 2017 Prior Therapy Facilty/Provider(s): [facility] Reason for Treatment: MH issues  Prior Outpatient Therapy Prior Outpatient Therapy: Yes Prior Therapy Dates: 2017 Prior Therapy Facilty/Provider(s): [facility] Reason for Treatment: MH issues Does patient have an ACCT team?: No Does patient have Intensive In-House Services?  : No Does patient have [facility] services? : No Does patient have P4CC services?: No  ADL Screening (condition at time of admission) Patient's cognitive ability adequate to safely complete daily activities?: Yes Is the patient deaf or have difficulty hearing?: No Does the patient have difficulty  seeing, even when wearing glasses/contacts?: No Does the patient have difficulty concentrating, remembering, or making decisions?: No Patient able to express need for assistance with ADLs?: Yes Does the patient have difficulty dressing or bathing?: No Independently performs ADLs?: Yes (appropriate for developmental age) Does the patient have difficulty walking or climbing stairs?: No Weakness of Legs: None Weakness of Arms/Hands: None  Home Assistive Devices/Equipment Home Assistive Devices/Equipment: None    Abuse/Neglect Assessment (Assessment to be complete while patient is alone) Physical Abuse: Denies Verbal Abuse: Denies Sexual Abuse: Denies Exploitation of patient/patient's resources: Denies Self-Neglect: Denies Values / Beliefs Cultural Requests During Hospitalization: None Spiritual Requests During Hospitalization: None   Advance Directives (For Healthcare) Does Patient Have a Medical Advance Directive?: No Type of Advance Directive: Healthcare Power of Attorney, Living will Would patient like information on creating a medical advance directive?: No - Patient declined Nutrition Screen- [place] Adult/[facility]/AP Patient's home diet: Regular  Additional Information 1:1 In Past 12 Months?: No CIRT Risk: No Elopement Risk: No Does patient have medical clearance?: Yes     Disposition: Per Waylan Boga, DNP, patient meets criteria for INPT treatment. TTS to seek placement.  Disposition Disposition of Patient: Inpatient treatment program Type of inpatient treatment program: Adult Other disposition(s):  Other (Comment) (inpatient admission)  On Site Evaluation by:   Reviewed with Physician:    Waldon Merl 02/12/2017 3:30 PM

## 2017-02-12 NOTE — Progress Notes (Signed)
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  She is doing well today physically.  She is not doing well psychologically and has continued to have issues.  She is crying and telling me she isn't sure if she has a place to live anymore.  She is tired, has decreased appetite, and has followed up with family services, but cannot tell me what they told her.  She did not bring her medication bottles today as requested, so the exact dosages of her current medications are unclear.  She thinks she needs to be admitted.  She denies any suicidal thoughts, or homicidal thoughts.  She is not yet scheduled to see Dr. Haroldine Laws or have her restaging studies done.    REVIEW OF SYSTEMS: [name] denies fevers, chills, nausea, vomiting, constipation, chest pain, palpitations, shortness of breath or swelling in her legs.  She does have some continued pain on her left chest wall under her left axilla that is her normal pain.    BREAST CANCER HISTORY: From the original intake note:  [name] was aware of a "lemon sized lump in" her left axilla for about a year before bringing it to medical attention. By then she had developed left breast and left axillary swelling (June 2016). She presented to the local emergency room and had a chest CT scan 06/06/2015 which showed a nodule in the left breast measuring 0.9 cm and questionable left axillary adenopathy. She then proceeded to  bilateral diagnostic mammography and left breast ultrasonography 06/19/2015. There were no prior films for comparison (last mammography 12 years prior).. The breast density was category C. Mammography showed in the left breast upper inner quadrant a 7 cm area including a small mass and significant pleomorphic calcifications. Ultrasonography defined the mass as measuring 1.2 cm. The left axilla appeared unremarkable. There was significant skin edema.  Biopsy of the left breast mass 06/19/2015 showed ([id number]) an invasive ductal carcinoma, grade 2, estrogen receptor 83% positive, progesterone receptor 26% positive, and HER-2 amplified by immunohistochemstry with a 3+ reading. The patient had biopsies of a separate area in the left breast August of the same year and this showed atypical ductal hyperplasia. (SP [id number]).  Accordingly after appropriate discussion on 08/21/2015 the patient proceeded to left mastectomy with left axillary sentinel lymph node sampling, which, since the lymph nodes were positive, extended to the procedure to left axillary lymph node dissection. The pathology ([id number]) showed an invasive ductal carcinoma, grade 3, measuring in excess of 9 cm. There were also skin satellites, not contiguous with the invasive carcinoma. Margins were clear and ample. There was evidence of lymphovascular invasion. A total of 15 lymph nodes were removed, including 5 sentinel lymph nodes, all of which were positive, so that the final total was 14 out of 15 lymph nodes involved by tumor. There was evidence of extranodal extension. The final pathology was pT4b pN2a, stage IIIB  CA-27-29 and CEA 09/19/2015 were non-informative October 2016.  Unfortunately CT scans of the chest abdomen and pelvis  09/16/2015 showed bony metastases to the right scapula, left iliac crest, and also L4 and T-spine. There were questionable liver cysts which on repeat CT scan 03/02/2016 appear to be a little bit more  well-defined, possibly a little larger. There were also some possible right upper lobe lung lesions.  Adjuvant treatment consisted of docetaxel, trastuzumab and pertuzumab, with the final (6th) docetaxel dose given 02/11/2016. She continues on trastuzumab and pertuzumab, with the 11th cycle given 05/05/2016. Echocardiogram 02/26/2016 showed an ejection fraction of 55%. She receives denosumab/Xgeva every 6 weeks.. She also receives radiation, started 06/09/201, to be completed 06/26/2016.  Her subsequent history is as detailed below   PAST MEDICAL HISTORY: Past Medical History:  Diagnosis Date  . Alcohol abuse   . Anemia    during chemo  . Anxiety    At age 4  . Arthritis Dx 2010  . Bipolar disorder ([facility])   . Cancer (HCC)    breast  . Chronic pain   . Depression   . Fibromyalgia Dx 2005  . GERD (gastroesophageal reflux disease)   . Headache    hx  migraines  . Opiate dependence ([place])   . Port-a-cath in place   . PTSD (post-traumatic stress disorder)     PAST SURGICAL HISTORY: Past Surgical History:  Procedure Laterality Date  . APPLICATION OF CRANIAL NAVIGATION N/A 08/14/2016   Procedure: APPLICATION OF CRANIAL NAVIGATION;  Surgeon: Erline Levine, MD;  Location: [facility];  Service: Neurosurgery;  Laterality: N/A;  . BREAST RECONSTRUCTION Left    with silicone implant  . CRANIOTOMY N/A 08/14/2016   Procedure: CRANIOTOMY TUMOR EXCISION WITH Lucky Rathke;  Surgeon: Erline Levine, MD;  Location: [facility];  Service: Neurosurgery;  Laterality: N/A;  . FIBULA FRACTURE SURGERY    . MASTECTOMY Left   . RADIOLOGY WITH ANESTHESIA N/A 07/23/2016   Procedure: MRI OF BRAIN WITH AND WITHOUT;  Surgeon: Medication Radiologist, MD;  Location: [facility];  Service: Radiology;  Laterality: N/A;  . right power port placement Right     FAMILY HISTORY Family History  Problem Relation Age of Onset  . Diabetes Mother   . Bipolar disorder Mother   . CAD Father   The patient's father still living,  age 45, in [place]. He had prostate cancer at some point in the past. The patient's mother died at age 10 from complications of diabetes. The patient had no brothers, 2 sisters. A paternal grandmother had lung cancer in the setting of tobacco abuse. There is no other history of cancer in the family to her knowledge  GYNECOLOGIC HISTORY:  No LMP recorded. Patient has had an ablation. Menarche approximately age 28. First live birth in 19. The patient is GX P2. She underwent endometrial ablation in 2016.  SOCIAL HISTORY:  The patient is originally from [place]. She has lived in [place] before but more recently was in [place]. She is back here because she could not afford her rent in [place]. She is living here and a temporary situation. She is divorced. HER-2 children are [name] who lives in [place] and works as a Development worker, community, and [name] who also lives in [place] and works as a Catering manager. The patient has a grandchild, Arelia Longest, 27 years old as of July 2017, living in [place] with his mother. The patient has not established herself with a local church yet.    ADVANCED DIRECTIVES: Not in place; at the 06/03/2016 visit the patient was given the appropriate forms to complete and notarize at her discretion  HEALTH MAINTENANCE: Social History  Substance Use Topics  . Smoking status: Current Every Day Smoker    Packs/day: 0.50    Types: Cigarettes  . Smokeless tobacco: Never Used     Comment: Pt is on Chantix at present time  . Alcohol use Yes     Comment: no ETOH since 08/22/12     Colonoscopy:  PAP:  Bone density:   Allergies  Allergen Reactions  . Demerol Itching and Nausea And Vomiting  . Erythromycin Rash    Can take zpak    Current Outpatient Prescriptions  Medication Sig Dispense Refill  . cetirizine (ZYRTEC) 10 MG tablet Take 1 tablet (10 mg total) by mouth daily. 30 tablet 11  . dexamethasone (DECADRON) 4 MG tablet Take 1 tablet (4 mg total) by mouth  2 (two) times daily. 60 tablet 0  . diphenoxylate-atropine (LOMOTIL) 2.5-0.025 MG tablet Take 2 tablets by mouth 4 (four) times daily as needed for diarrhea or loose stools. 40 tablet 0  . fluticasone (FLONASE) 50 MCG/ACT nasal spray Place 1 spray into the nose daily.    [name] gabapentin (NEURONTIN) 300 MG capsule Take 2 capsules (600 mg total) by mouth 3 (three) times daily. 300 mg at night for one day, twice daily for one day, then three times daily 180 capsule 2  . hyaluronate sodium (RADIAPLEXRX) GEL Apply 1 application topically.    [name] HYDROcodone-acetaminophen (NORCO) 7.5-325 MG tablet Take 1-2 tablets by mouth every 6 (six) hours as needed for severe pain (give one tab, then if other tab is needed, may give 30 min later.). 60 tablet 0  . ibuprofen (ADVIL,MOTRIN) 200 MG tablet Take 800 mg by mouth every 6 (six) hours as needed for mild pain.    [name] lamoTRIgine (LAMICTAL) 100 MG tablet Take 1.5 tablets (150 mg total) by mouth 2 (two) times daily. 60 tablet 3  . lidocaine-prilocaine (EMLA) cream Apply 1 application topically as needed. 30 g 0  . lithium carbonate 300 MG capsule Take 1 capsule (300 mg total) by mouth 2 (two) times daily with a meal. 60 capsule 3  . Loperamide HCl (IMODIUM PO) Take 1 tablet by mouth daily as needed (diarhea).    . LORazepam (ATIVAN) 0.5 MG tablet Take 1 tablet (0.5 mg total) by mouth every 8 (eight) hours as needed for anxiety. 30 tablet 1  . ondansetron (ZOFRAN) 8 MG tablet Take 1 tablet (8 mg total) by mouth every 8 (eight) hours as needed for nausea or vomiting. 30 tablet 1  . pantoprazole (PROTONIX) 40 MG tablet Take 1 tablet (40 mg total) by mouth daily. 30 tablet 3  . polyethylene glycol powder (GLYCOLAX/MIRALAX) powder Take 17 g by mouth 2 (two) times daily as needed for mild constipation. (Patient not taking: Reported on 08/13/2016) 3350 g 3  . potassium chloride (K-DUR) 10 MEQ tablet Take 1 tablet (10 mEq total) by mouth daily. 30 tablet 3  . prochlorperazine  (COMPAZINE) 10 MG tablet Take 10 mg by mouth daily as needed for nausea.     [name] tiZANidine (ZANAFLEX) 4 MG tablet Take 1 tablet (4 mg total) by mouth daily. 30 tablet 0  . traMADol (ULTRAM) 50 MG tablet Take 1 tablet (50 mg total) by mouth every 6 (six) hours as needed. 60 tablet 0  . trazodone (DESYREL) 300 MG tablet Take 1 tablet (300 mg total) by mouth at bedtime. 30 tablet 3  . triamcinolone cream (KENALOG) 0.5 % Apply 1 application topically daily as needed.  1  .  trolamine salicylate (ASPERCREME) 10 % cream Apply 1 application topically as needed for muscle pain. Reported on 06/01/2016    . varenicline (CHANTIX) 0.5 MG tablet Take 1 tablet (0.5 mg total) by mouth daily. 30 tablet 3  . Vitamin D, Cholecalciferol, 1000 units CAPS Take 1,000 Units by mouth daily.     No current facility-administered medications for this visit.     OBJECTIVE:   Vitals:   02/12/17 0833  BP: 126/73  Pulse: (!) 101  Resp: 18  Temp: 97.9 F (36.6 C)  TempSrc: Oral  SpO2: 100%  Weight: 132 lb 3.2 oz (60 kg)  Height: 5' 5"  (1.651 m)  Body mass index is 22 kg/m. GENERAL: Patient is a well appearing female in no acute distress HEENT:  Sclerae anicteric.  Oropharynx clear and moist. No ulcerations or evidence of oropharyngeal candidiasis. Neck is supple.  NODES:  No cervical, supraclavicular, or axillary lymphadenopathy palpated.  BREAST EXAM:  Deferred. LUNGS:  Clear to auscultation bilaterally.  No wheezes or rhonchi. HEART:  Regular rate and rhythm. No murmur appreciated. ABDOMEN:  Soft, nontender.  Positive, normoactive bowel sounds. No organomegaly palpated. MSK:  No focal spinal tenderness to palpation. Full range of motion bilaterally in the upper extremities. EXTREMITIES:  No peripheral edema.   SKIN:  Clear with no obvious rashes or skin changes. No nail dyscrasia. NEURO:  Nonfocal. +tremors (at baseline) Psych: tearful, anxious affect      LAB RESULTS:  CMP     Component Value  Date/Time   NA 140 08/13/2016 0926   NA 139 07/28/2016 1118   K 4.3 08/13/2016 0926   K 4.1 07/28/2016 1118   CL 107 08/13/2016 0926   CO2 25 08/13/2016 0926   CO2 26 07/28/2016 1118   GLUCOSE 96 08/13/2016 0926   GLUCOSE 91 07/28/2016 1118   BUN 8 08/13/2016 0926   BUN 8.6 07/28/2016 1118   CREATININE 0.94 08/13/2016 0926   CREATININE 0.8 07/28/2016 1118   CALCIUM 9.2 08/13/2016 0926   CALCIUM 9.7 07/28/2016 1118   PROT 6.7 07/28/2016 1118   ALBUMIN 4.0 07/28/2016 1118   AST 18 07/28/2016 1118   ALT 13 07/28/2016 1118   ALKPHOS 50 07/28/2016 1118   BILITOT <0.30 07/28/2016 1118   GFRNONAA >60 08/13/2016 0926   GFRAA >60 08/13/2016 0926    INo results found for: SPEP, UPEP  Lab Results  Component Value Date   WBC 5.0 08/13/2016   NEUTROABS 4.2 07/28/2016   HGB 12.9 08/13/2016   HCT 40.8 08/13/2016   MCV 96.9 08/13/2016   PLT 171 08/13/2016      Chemistry      Component Value Date/Time   NA 140 08/13/2016 0926   NA 139 07/28/2016 1118   K 4.3 08/13/2016 0926   K 4.1 07/28/2016 1118   CL 107 08/13/2016 0926   CO2 25 08/13/2016 0926   CO2 26 07/28/2016 1118   BUN 8 08/13/2016 0926   BUN 8.6 07/28/2016 1118   CREATININE 0.94 08/13/2016 0926   CREATININE 0.8 07/28/2016 1118      Component Value Date/Time   CALCIUM 9.2 08/13/2016 0926   CALCIUM 9.7 07/28/2016 1118   ALKPHOS 50 07/28/2016 1118   AST 18 07/28/2016 1118   ALT 13 07/28/2016 1118   BILITOT <0.30 07/28/2016 1118       No results found for: LABCA2  No components found for: LABCA125  No results for input(s): INR in the last 168 hours.  Urinalysis  Component Value Date/Time   COLORURINE YELLOW 04/18/2013 1222   APPEARANCEUR CLEAR 04/18/2013 1222   LABSPEC 1.011 04/18/2013 1222   PHURINE 5.5 04/18/2013 1222   GLUCOSEU NEGATIVE 04/18/2013 1222   HGBUR TRACE (A) 04/18/2013 1222   BILIRUBINUR negative 03/28/2015 1321   KETONESUR NEGATIVE 04/18/2013 1222   PROTEINUR negative 03/28/2015  1321   PROTEINUR NEGATIVE 04/18/2013 1222   UROBILINOGEN 0.2 03/28/2015 1321   UROBILINOGEN 0.2 04/18/2013 1222   NITRITE negative 03/28/2015 1321   NITRITE NEGATIVE 04/18/2013 1222   LEUKOCYTESUR Trace 03/28/2015 1321     STUDIES: No results found.   ELIGIBLE FOR AVAILABLE RESEARCH PROTOCOL: no  ASSESSMENT: 56 y.o. 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  (2) status post left mastectomy and axillary lymph node dissection  with immediate expander placement 07/18/2015 for an mpT4 pN2,stage IIIB invasive ductal carcinoma, grade 3, with negative margins.  (a) definitive implant exchange to be scheduled in December   METASTATIC DISEASE: October 2016  (3) CT scan of the chest abdomen and pelvis  09/16/2015 shows metastatic lesions in the right scapula, left iliac crest, L4, and T spine. There were questionable liver cysts, with repeat CT scan 03/02/2016 showing possible right upper lobe lung lesions and possibly increased liver lesions  (a) CT scan of the chest 06/17/2016 shows no active disease in the lungs or liver  (b) Bone scan July 2017 showed no evidence of bony metastatic disease   (c) head CT 07/08/2016 showed a cerebellar lesion, confirmed by MRI 07/23/2016, status post craniotomy 08/14/2016, confirming a metastatic deposit which was estrogen and progesterone receptor negative, HER-2 amplified with a signals ratio of 7.16, number per cell 13.25  (d) CA 27-29 not informative  (4) received docetaxel every 3 weeks 6 together with trastuzumab and pertuzumab, last docetaxel dose 02/11/2016  (5) adjuvant radiation7/03/2016 to 06/26/2016 at [facility]: 1. The Left chest wall was treated to 23.4 Gy in 13 fractions at 1.8 Gy per fraction. 2. The Left chest wall was boosted to 10 Gy in 5 fractions  at 2 Gy per fraction. 3. The Left Sclav/PAB was treated to 23.4 Gy in 13 fractions at 1.8 Gy per fraction.  Including the patient's treatment in [place] (received 15 fractions per Dr. Maryan Rued near [place], [place]), the patient received 50.4 Gy to the left chest wall and supraclavicular region.   (6) started trastuzumab and pertuzumab October 2016, continuing every 3 weeks,  (a) echocardiogram 02/26/2016 showed a well preserved ejection fraction  (b) echocardiogram 07/01/2016 shows an ejection fraction in the 60-65%   (c) pertuzumab discontinued 08/25/2016 with uncontrolled diarrhea  (d) echocardiogram 11/11/2016 showed an ejection fraction in the 60-65%  (7) started denosumab/Xgeva October 2017, repeated every 6 weeks  (8) started anastrozole October 2017   (a) bone scan 11/10/2016 shows no active disease  (b) chest CT scan 11/10/2016 stable, with no evidence of active disease  (9) history of bipolar disorder  (a) lithium level checked every 6 weeks  (10) mild anemia with a significant drop in the MCV, ferritin 10 06/03/2016,   (a) Feraheme given 06/12/2016 and 06/18/2016  (11) tobacco abuse: Chantix started 06/18/2016   (12) 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  (a) brain irradiation10/19/17-11/08/17: Whole brain/ 35 Gy in 14 fractions   (b) repeat brain MRI obtained 12/10/2016  shows no active disease in the brain   PLAN: [name] will receive treatment with Trastuzumab today.  I will call and talk to central radiology about getting her bone scan and CT chest scheduled.  She needs appt soon with Dr. Haroldine Laws, her last echo was on 11/11/16 and demonstrated LVEF of 60-65%.   I spoke with [name] in social work and reviewed this patient again with her.  I am very concerned about [name] and her psychiatric issues.  She would really benefit from being in a structured environment such as a group home with a caregiver who can keep track of her  medications, and her care providers and orders/recommendations from them, as well as accompany her to appointments.  [name] is going to meet with the patient today and again reach out to her mental health provider and PCP after she evaluates the situation.     Judythe will return in 3 weeks on 03/05/17 for labs, an appointment, and Trastuzumab.  She knows to call us if she has any questions or concerns prior to her next appointment.    A total of (50) minutes of face-to-face time was spent with this patient with greater than 50% of that time in counseling and care-coordination.    Scot Dock, NP Medical Oncology and Hematology [facility] [address] [place], [place] [address] Tel. [phone]    Fax. [phone]

## 2017-02-12 NOTE — ED Notes (Signed)
Pt asked for water, lorazepam, and to see the doctor. Pt was given water.

## 2017-02-12 NOTE — ED Notes (Signed)
TTS at bedside, [facility]

## 2017-02-12 NOTE — Progress Notes (Signed)
[place] Work  Clinical Social Work was referred by medical oncology PA for assessment of psychosocial needs due to psych concerns, possible care issues at home and care coordination.  Clinical Social Worker met with patient at [facility] in the infusion room to offer support and assess for needs.  Pt tearful, confused, kept trying to get out of her chair while the nurses were trying to give her fluids. Pt shared she was "sleeping all day and up all night". She had a large ziploc bag of her medicines, bag of paperwork, but could not make much sense out of either, which providers she had seen or her next steps for care. Pt shared "Cletis", her friend was saying she needed to go to [facility] after being at [facility]. Pt very tearful when discussing this. CSW discussed with pt how this may be what she needs to feel better right now. Pt nodded her head in agreement. Pt shared with CSW paperwork from last Inpt psych stay through a [facility] facility and pt appeared to have a follow up with them, but not clear if pt had gone. CSW left vm for [facility] CSW, Jasmine to inquire about follow up plans. CSW has made multiple attempts to contact friend, Cletis, with no return call to date.   CSW had recommended [facility] for CNA support, but medical oncology wanted PCP to complete as they feel these issues are related to non-cancer concerns. Pt reports PCS referral was made, but pt states she was declined services because she "does not qualify". Pt appears to not be able to manage her own medications and ADLs for a variety of possible reasons. She has very limited support and may live at a "half way house" or "group home", but she is technically NOT a resident there. Per her report, she was employed there and now allowed to stay there due to limited local support. This CSW strongly feels pt needs ALF due to her advanced cancer and mental health issues. CSW feels these two severe issues are inter-related and together are devastating to pt.  If  pt returns to home, she needs community case management support, ACT and/or possible APS support. Pt has shown her memory is impacted, she cannot write, she cannot manage her appointment schedule or finances independently. CSW assisted pt with SSI application through the [facility] several weeks ago, but pt has no idea of outcome. Pt appears that she cannot manage her life independently. CSW recommends Psych eval at ED. This CSW will continue to follow through [facility] for cancer related concerns.    Loren Racer, LCSW, OSW-C Clinical Social Worker [place] Phone: [phone] Fax: [phone]

## 2017-02-12 NOTE — Progress Notes (Signed)
PHARMACIST - PHYSICIAN ORDER COMMUNICATION  CONCERNING: P&T Medication Policy on Herbal Medications  DESCRIPTION:  This patient's order for: Biotin has been noted.  This product(s) is classified as an "herbal" or natural product. Due to a lack of definitive safety studies or FDA approval, nonstandard manufacturing practices, plus the potential risk of unknown drug-drug interactions while on inpatient medications, the Pharmacy and Therapeutics Committee does not permit the use of "herbal" or natural products of this type within [facility].   ACTION TAKEN: The pharmacy department is unable to verify this order at this time and your patient has been informed of this safety policy. Please reevaluate patient's clinical condition at discharge and address if the herbal or natural product(s) should be resumed at that time.    Lindell Spar, PharmD, BCPS Pager: [phone] 02/12/2017 6:20 PM

## 2017-02-12 NOTE — Progress Notes (Signed)
Okay to treat today with last echo in December per Dr. [name]. Reported confusion and patient requesting extra fluid, see order.  Reported called to ED charge nurse, via w/c with nurse x2 to TCU room 28.

## 2017-02-12 NOTE — ED Notes (Signed)
Pt attempting to fall asleep and asks to have the head of the bed raised/lowered in an attempt to get comfortable.

## 2017-02-12 NOTE — ED Notes (Signed)
Resending urine sample

## 2017-02-12 NOTE — ED Triage Notes (Signed)
Patient stage 4 breast cancer scheduled for chemo today, came in confused a/ox2 and acting bizarre, pt agitated and tearful

## 2017-02-12 NOTE — ED Notes (Signed)
Urine sample sent eariler had the wrong patients name on the specimen cup; attempting to get another urine sample from this patient

## 2017-02-12 NOTE — ED Notes (Signed)
Pt dry heaving.  Pt requesting zofran.

## 2017-02-12 NOTE — Patient Instructions (Addendum)
[name] Discharge Instructions for Patients Receiving Chemotherapy  Today you received the following chemotherapy agents: Herceptin.  To help prevent nausea and vomiting after your treatment, we encourage you to take your nausea medication as directed.    If you develop nausea and vomiting that is not controlled by your nausea medication, call the clinic.   BELOW ARE SYMPTOMS THAT SHOULD BE REPORTED IMMEDIATELY:  *FEVER GREATER THAN 100.5 F  *CHILLS WITH OR WITHOUT FEVER  NAUSEA AND VOMITING THAT IS NOT CONTROLLED WITH YOUR NAUSEA MEDICATION  *UNUSUAL SHORTNESS OF BREATH  *UNUSUAL BRUISING OR BLEEDING  TENDERNESS IN MOUTH AND THROAT WITH OR WITHOUT PRESENCE OF ULCERS  *URINARY PROBLEMS  *BOWEL PROBLEMS  UNUSUAL RASH Items with * indicate a potential emergency and should be followed up as soon as possible.  Feel free to call the clinic you have any questions or concerns. The clinic phone number is [phone].  Please show the [facility] [place] at check-in to the Emergency Department and triage nurse.   Dehydration, Adult Dehydration is a condition in which there is not enough fluid or water in the body. This happens when you lose more fluids than you take in. Important organs, such as the kidneys, brain, and heart, cannot function without a proper amount of fluids. Any loss of fluids from the body can lead to dehydration. Dehydration can range from mild to severe. This condition should be treated right away to prevent it from becoming severe. What are the causes? This condition may be caused by:  Vomiting.  Diarrhea.  Excessive sweating, such as from heat exposure or exercise.  Not drinking enough fluid, especially:  When ill.  While doing activity that requires a lot of energy.  Excessive urination.  Fever.  Infection.  Certain medicines, such as medicines that cause the body to lose excess fluid (diuretics).  Inability to access safe  drinking water.  Reduced physical ability to get adequate water and food. What increases the risk? This condition is more likely to develop in people:  Who have a poorly controlled long-term (chronic) illness, such as diabetes, heart disease, or kidney disease.  Who are age 21 or older.  Who are disabled.  Who live in a place with high altitude.  Who play endurance sports. What are the signs or symptoms? Symptoms of mild dehydration may include:   Thirst.  Dry lips.  Slightly dry mouth.  Dry, warm skin.  Dizziness. Symptoms of moderate dehydration may include:   Very dry mouth.  Muscle cramps.  Dark urine. Urine may be the color of tea.  Decreased urine production.  Decreased tear production.  Heartbeat that is irregular or faster than normal (palpitations).  Headache.  Light-headedness, especially when you stand up from a sitting position.  Fainting (syncope). Symptoms of severe dehydration may include:   Changes in skin, such as:  Cold and clammy skin.  Blotchy (mottled) or pale skin.  Skin that does not quickly return to normal after being lightly pinched and released (poor skin turgor).  Changes in body fluids, such as:  Extreme thirst.  No tear production.  Inability to sweat when body temperature is high, such as in hot weather.  Very little urine production.  Changes in vital signs, such as:  Weak pulse.  Pulse that is more than 100 beats a minute when sitting still.  Rapid breathing.  Low blood pressure.  Other changes, such as:  Sunken eyes.  Cold hands and feet.  Confusion.  Lack of energy (lethargy).  Difficulty waking up from sleep.  Short-term weight loss.  Unconsciousness. How is this diagnosed? This condition is diagnosed based on your symptoms and a physical exam. Blood and urine tests may be done to help confirm the diagnosis. How is this treated? Treatment for this condition depends on the severity. Mild  or moderate dehydration can often be treated at home. Treatment should be started right away. Do not wait until dehydration becomes severe. Severe dehydration is an emergency and it needs to be treated in a hospital. Treatment for mild dehydration may include:   Drinking more fluids.  Replacing salts and minerals in your blood (electrolytes) that you may have lost. Treatment for moderate dehydration may include:   Drinking an oral rehydration solution (ORS). This is a drink that helps you replace fluids and electrolytes (rehydrate). It can be found at pharmacies and retail stores. Treatment for severe dehydration may include:   Receiving fluids through an IV tube.  Receiving an electrolyte solution through a feeding tube that is passed through your nose and into your stomach (nasogastric tube, or NG tube).  Correcting any abnormalities in electrolytes.  Treating the underlying cause of dehydration. Follow these instructions at home:  If directed by your health care provider, drink an ORS:  Make an ORS by following instructions on the package.  Start by drinking small amounts, about  cup (120 mL) every 5-10 minutes.  Slowly increase how much you drink until you have taken the amount recommended by your health care provider.  Drink enough clear fluid to keep your urine clear or pale yellow. If you were told to drink an ORS, finish the ORS first, then start slowly drinking other clear fluids. Drink fluids such as:  Water. Do not drink only water. Doing that can lead to having too little salt (sodium) in the body (hyponatremia).  Ice chips.  Fruit juice that you have added water to (diluted fruit juice).  Low-calorie sports drinks.  Avoid:  Alcohol.  Drinks that contain a lot of sugar. These include high-calorie sports drinks, fruit juice that is not diluted, and soda.  Caffeine.  Foods that are greasy or contain a lot of fat or sugar.  Take over-the-counter and  prescription medicines only as told by your health care provider.  Do not take sodium tablets. This can lead to having too much sodium in the body (hypernatremia).  Eat foods that contain a healthy balance of electrolytes, such as bananas, oranges, potatoes, tomatoes, and spinach.  Keep all follow-up visits as told by your health care provider. This is important. Contact a health care provider if:  You have abdominal pain that:  Gets worse.  Stays in one area (localizes).  You have a rash.  You have a stiff neck.  You are more irritable than usual.  You are sleepier or more difficult to wake up than usual.  You feel weak or dizzy.  You feel very thirsty.  You have urinated only a small amount of very dark urine over 6-8 hours. Get help right away if:  You have symptoms of severe dehydration.  You cannot drink fluids without vomiting.  Your symptoms get worse with treatment.  You have a fever.  You have a severe headache.  You have vomiting or diarrhea that:  Gets worse.  Does not go away.  You have blood or green matter (bile) in your vomit.  You have blood in your stool. This may cause stool to look black  and tarry.  You have not urinated in 6-8 hours.  You faint.  Your heart rate while sitting still is over 100 beats a minute.  You have trouble breathing. This information is not intended to replace advice given to you by your health care provider. Make sure you discuss any questions you have with your health care provider. Document Released: 11/16/2005 Document Revised: 06/12/2016 Document Reviewed: 01/10/2016 Elsevier Interactive Patient Education  2017 [place]. Denosumab injection What is this medicine? DENOSUMAB (den oh sue mab) slows bone breakdown. Prolia is used to treat osteoporosis in women after menopause and in men. Delton See is used to treat a high calcium level due to cancer and to prevent bone fractures and other bone problems caused by  multiple myeloma or cancer bone metastases. Delton See is also used to treat giant cell tumor of the bone. This medicine may be used for other purposes; ask your health care provider or pharmacist if you have questions. COMMON BRAND NAME(S): Prolia, XGEVA What should I tell my health care provider before I take this medicine? They need to know if you have any of these conditions: -dental disease -having surgery or tooth extraction -infection -kidney disease -low levels of calcium or Vitamin D in the blood -malnutrition -on hemodialysis -skin conditions or sensitivity -thyroid or parathyroid disease -an unusual reaction to denosumab, other medicines, foods, dyes, or preservatives -pregnant or trying to get pregnant -breast-feeding How should I use this medicine? This medicine is for injection under the skin. It is given by a health care professional in a hospital or clinic setting. If you are getting Prolia, a special MedGuide will be given to you by the pharmacist with each prescription and refill. Be sure to read this information carefully each time. For Prolia, talk to your pediatrician regarding the use of this medicine in children. Special care may be needed. For Delton See, talk to your pediatrician regarding the use of this medicine in children. While this drug may be prescribed for children as young as 13 years for selected conditions, precautions do apply. Overdosage: If you think you have taken too much of this medicine contact a poison control center or emergency room at once. NOTE: This medicine is only for you. Do not share this medicine with others. What if I miss a dose? It is important not to miss your dose. Call your doctor or health care professional if you are unable to keep an appointment. What may interact with this medicine? Do not take this medicine with any of the following medications: -other medicines containing denosumab This medicine may also interact with the following  medications: -medicines that lower your chance of fighting infection -steroid medicines like prednisone or cortisone This list may not describe all possible interactions. Give your health care provider a list of all the medicines, herbs, non-prescription drugs, or dietary supplements you use. Also tell them if you smoke, drink alcohol, or use illegal drugs. Some items may interact with your medicine. What should I watch for while using this medicine? Visit your doctor or health care professional for regular checks on your progress. Your doctor or health care professional may order blood tests and other tests to see how you are doing. Call your doctor or health care professional for advice if you get a fever, chills or sore throat, or other symptoms of a cold or flu. Do not treat yourself. This drug may decrease your body's ability to fight infection. Try to avoid being around people who are sick. You  should make sure you get enough calcium and vitamin D while you are taking this medicine, unless your doctor tells you not to. Discuss the foods you eat and the vitamins you take with your health care professional. See your dentist regularly. Brush and floss your teeth as directed. Before you have any dental work done, tell your dentist you are receiving this medicine. Do not become pregnant while taking this medicine or for 5 months after stopping it. Talk with your doctor or health care professional about your birth control options while taking this medicine. Women should inform their doctor if they wish to become pregnant or think they might be pregnant. There is a potential for serious side effects to an unborn child. Talk to your health care professional or pharmacist for more information. What side effects may I notice from receiving this medicine? Side effects that you should report to your doctor or health care professional as soon as possible: -allergic reactions like skin rash, itching or hives,  swelling of the face, lips, or tongue -bone pain -breathing problems -dizziness -jaw pain, especially after dental work -redness, blistering, peeling of the skin -signs and symptoms of infection like fever or chills; cough; sore throat; pain or trouble passing urine -signs of low calcium like fast heartbeat, muscle cramps or muscle pain; pain, tingling, numbness in the hands or feet; seizures -unusual bleeding or bruising -unusually weak or tired Side effects that usually do not require medical attention (report to your doctor or health care professional if they continue or are bothersome): -constipation -diarrhea -headache -joint pain -loss of appetite -muscle pain -runny nose -tiredness -upset stomach This list may not describe all possible side effects. Call your doctor for medical advice about side effects. You may report side effects to FDA at 1-800-FDA-1088. Where should I keep my medicine? This medicine is only given in a clinic, doctor's office, or other health care setting and will not be stored at home. NOTE: This sheet is a summary. It may not cover all possible information. If you have questions about this medicine, talk to your doctor, pharmacist, or health care provider.  2018 Elsevier/Gold Standard (2016-12-08 19:17:21)

## 2017-02-12 NOTE — BH Assessment (Signed)
[facility] Assessment Progress Note  Per Waylan Boga, DNP, this pt requires psychiatric hospitalization at this time.  The following facilities have been contacted to seek placement for this pt, with results as noted:  Beds available, information sent, decision pending:  [facility], [place] Triage Specialist [phone]

## 2017-02-12 NOTE — ED Notes (Signed)
Pt a/ox3ish; pt is very anxious and teary eyed; cancer center says shes acting very bizarre, pt sts she doesn't want to talk to me, she needs a "break"; sitter at bedside

## 2017-02-13 ENCOUNTER — Encounter (HOSPITAL_COMMUNITY): Payer: Self-pay | Admitting: Registered Nurse

## 2017-02-13 DIAGNOSIS — R269 Unspecified abnormalities of gait and mobility: Secondary | ICD-10-CM | POA: Diagnosis not present

## 2017-02-13 DIAGNOSIS — F17210 Nicotine dependence, cigarettes, uncomplicated: Secondary | ICD-10-CM

## 2017-02-13 DIAGNOSIS — Z818 Family history of other mental and behavioral disorders: Secondary | ICD-10-CM

## 2017-02-13 DIAGNOSIS — Z79899 Other long term (current) drug therapy: Secondary | ICD-10-CM | POA: Diagnosis not present

## 2017-02-13 DIAGNOSIS — F3130 Bipolar disorder, current episode depressed, mild or moderate severity, unspecified: Secondary | ICD-10-CM

## 2017-02-13 LAB — CANCER ANTIGEN 27.29: CA 27.29: 11.2 U/mL (ref 0.0–38.6)

## 2017-02-13 MED ORDER — LAMOTRIGINE 25 MG PO TABS: 50.0000 mg | ORAL_TABLET | Freq: Every day | ORAL | Status: DC

## 2017-02-13 MED ORDER — GABAPENTIN 300 MG PO CAPS: 300.0000 mg | ORAL_CAPSULE | Freq: Three times a day (TID) | ORAL | 0 refills | Status: DC

## 2017-02-13 MED ORDER — GABAPENTIN 300 MG PO CAPS: 300.0000 mg | ORAL_CAPSULE | Freq: Three times a day (TID) | ORAL | Status: DC

## 2017-02-13 MED ORDER — TRAZODONE HCL 50 MG PO TABS: 150.0000 mg | ORAL_TABLET | Freq: Every day | ORAL | Status: DC

## 2017-02-13 MED ORDER — LITHIUM CARBONATE ER 450 MG PO TBCR: 450.0000 mg | EXTENDED_RELEASE_TABLET | Freq: Every day | ORAL | Status: DC

## 2017-02-13 MED ORDER — LITHIUM CARBONATE ER 450 MG PO TBCR: 450.0000 mg | EXTENDED_RELEASE_TABLET | Freq: Every day | ORAL | 0 refills | Status: DC

## 2017-02-13 MED ORDER — LAMOTRIGINE 25 MG PO TABS: 50.0000 mg | ORAL_TABLET | Freq: Every day | ORAL | 0 refills | Status: DC

## 2017-02-13 NOTE — Discharge Instructions (Signed)
For your ongoing mental health needs, you are advised to follow up with [facility]. Walk in hours are Monday - Friday: 8:30am-12:00pm / 1:00pm-2:30pm. ° °[facility] °[address] °[place], [place] [address] °[phone] °

## 2017-02-13 NOTE — Consult Note (Signed)
[place] Psychiatry Consult   Reason for Consult: psychiatric evaluation Referring Physician:  EDP Patient Identification: [name] MRN:  [id number] Principal Diagnosis: Bipolar I disorder, most recent episode depressed [facility]) Diagnosis:   Patient Active Problem List   Diagnosis Date Noted  . Bipolar I disorder, most recent episode depressed ([place]) [F31.30] 12/08/2016    Priority: Low  . Gait abnormality [R26.9] 01/06/2017  . Adjustment disorder with anxiety [F43.22] 12/06/2016  . Delirium due to another medical condition [F05] 12/03/2016  . Acute encephalopathy [G93.40] 12/03/2016  . Diarrhea [R19.7] 12/03/2016  . Metastatic breast cancer ([place]) [C50.919]   . Cerumen impaction [H61.20] 11/25/2016  . Otitis media [H66.90] 11/06/2016  . Brain metastasis ([place]) [C79.31] 07/27/2016  . Iron deficiency anemia [D50.9] 06/26/2016  . Bone metastases ([place]) [C79.51] 06/03/2016  . Primary cancer of lower-inner quadrant of left female breast ([place]) [C50.312] 06/01/2016  . Pap smear for cervical cancer screening [Z12.4] 03/28/2015  . Current smoker [F17.200] 03/28/2015  . Healthcare maintenance [Z00.00] 03/28/2015  . Seasonal allergies [J30.2] 03/28/2015  . Anxiety state [F41.1] 02/28/2015  . Fibromyalgia [M79.7] 02/28/2015  . Family history of diabetes mellitus [Z83.3] 02/28/2015  . H/O alcohol abuse [Z87.898]     Total Time spent with patient: 45 minutes  Subjective:   [name] is a 56 y.o. female patient admitted due to being anxious.  HPI: Patient reports history of Bipolar depression for which she was hospitalized last month. She also reports that she is receiving treatment for Breast Cancer but does not know why she was admitted to [facility] yesterday. Per records, patient was referred to the ED by her oncologist for evaluation of depression, anxiety and confusion. Today, patient is alert and oriented x3 and denies SI/HI, Mood swings, depression, psychosis, delusions, alcohol or  drug abuse. She reports that she lives with a man and does not belive she needs to be admitted to the hosspital.   Past Psychiatric History: as above  Risk to Self: Suicidal Ideation: denies Suicidal Intent:  (unk; pt refused) Is patient at risk for suicide?: denies Suicidal Plan?:  (unk; pt refused to answer) Access to Means:  (unk; patient refused to answer) What has been your use of drugs/alcohol within the last 12 months?:  (patient denies; past notes indicate that she is in recovery) How many times?:  (0) Other Self Harm Risks:  (none reported) Triggers for Past Attempts: Unknown Intentional Self Injurious Behavior: None Risk to Others: Homicidal Ideation: No Thoughts of Harm to Others: No Current Homicidal Intent: No Current Homicidal Plan: No Access to Homicidal Means: No Identified Victim:  (n/a) History of harm to others?: No Assessment of Violence: None Noted Violent Behavior Description:  (patient is calm and cooperative ) Does patient have access to weapons?: No Criminal Charges Pending?: No Does patient have a court date: No Prior Inpatient Therapy: Prior Inpatient Therapy: Yes Prior Therapy Dates: 2017 Prior Therapy Facilty/Provider(s): [facility] Reason for Treatment: MH issues Prior Outpatient Therapy: Prior Outpatient Therapy: Yes Prior Therapy Dates: 2017 Prior Therapy Facilty/Provider(s): [facility] Reason for Treatment: MH issues Does patient have an ACCT team?: No Does patient have Intensive In-House Services?  : No Does patient have [facility] services? : No Does patient have P4CC services?: No  Past Medical History:  Past Medical History:  Diagnosis Date  . Alcohol abuse   . Anemia    during chemo  . Anxiety    At age 89  . Arthritis Dx 2010  . Bipolar disorder ([place])   .  Cancer ([place])    breast mets to brain  . Chronic pain   . Complication of anesthesia   . Depression   . Fibromyalgia Dx 2005  . GERD (gastroesophageal reflux disease)    . Headache    hx  migraines  . Opiate dependence ([place])   . PONV (postoperative nausea and vomiting)   . Port-a-cath in place   . PTSD (post-traumatic stress disorder)     Past Surgical History:  Procedure Laterality Date  . APPLICATION OF CRANIAL NAVIGATION N/A 08/14/2016   Procedure: APPLICATION OF CRANIAL NAVIGATION;  Surgeon: Erline Levine, MD;  Location: [place] NEURO ORS;  Service: Neurosurgery;  Laterality: N/A;  . BREAST RECONSTRUCTION Left    with silicone implant  . CRANIOTOMY N/A 08/14/2016   Procedure: CRANIOTOMY TUMOR EXCISION WITH Lucky Rathke;  Surgeon: Erline Levine, MD;  Location: [place] NEURO ORS;  Service: Neurosurgery;  Laterality: N/A;  . FIBULA FRACTURE SURGERY Left   . MASTECTOMY Left   . RADIOLOGY WITH ANESTHESIA N/A 07/23/2016   Procedure: MRI OF BRAIN WITH AND WITHOUT;  Surgeon: Medication Radiologist, MD;  Location: [place];  Service: Radiology;  Laterality: N/A;  . RADIOLOGY WITH ANESTHESIA N/A 09/08/2016   Procedure: MRI OF BRAIN WITH AND WITHOUT CONTRAST;  Surgeon: Medication Radiologist, MD;  Location: [place];  Service: Radiology;  Laterality: N/A;  . RADIOLOGY WITH ANESTHESIA N/A 12/10/2016   Procedure: MRI OF BRAIN WITH AND WITHOUT;  Surgeon: Medication Radiologist, MD;  Location: [place];  Service: Radiology;  Laterality: N/A;  . right power port placement Right    Family History:  Family History  Problem Relation Age of Onset  . Diabetes Mother   . Bipolar disorder Mother   . CAD Father    Family Psychiatric  History:  Social History:  History  Alcohol Use No    Comment: no ETOH since 08/22/12     History  Drug Use No    Comment: prescription opiates from the street daily until Sunday    Social History   Social History  . Marital status: Single    Spouse name: N/A  . Number of children: N/A  . Years of education: N/A   Social History Main Topics  . Smoking status: Current Every Day Smoker    Packs/day: 0.50    Types: Cigarettes  . Smokeless tobacco:  Never Used     Comment: Pt is on Chantix at present time  . Alcohol use No     Comment: no ETOH since 08/22/12  . Drug use: No     Comment: prescription opiates from the street daily until Sunday  . Sexual activity: No     Comment: ablation   Other Topics Concern  . None   Social History Narrative  . None   Additional Social History:    Allergies:   Allergies  Allergen Reactions  . Demerol Itching and Nausea And Vomiting  . Erythromycin Rash    Labs:  Results for orders placed or performed during the hospital encounter of 02/12/17 (from the past 48 hour(s))  Urinalysis, Routine w reflex microscopic     Status: Abnormal   Collection Time: 02/12/17  1:34 PM  Result Value Ref Range   Color, Urine COLORLESS (A) YELLOW   APPearance CLEAR CLEAR   Specific Gravity, Urine 1.003 (L) 1.005 - 1.030   pH 5.0 5.0 - 8.0   Glucose, UA NEGATIVE NEGATIVE mg/dL   Hgb urine dipstick SMALL (A) NEGATIVE   Bilirubin Urine NEGATIVE NEGATIVE  Ketones, ur 5 (A) NEGATIVE mg/dL   Protein, ur NEGATIVE NEGATIVE mg/dL   Nitrite NEGATIVE NEGATIVE   Leukocytes, UA SMALL (A) NEGATIVE   RBC / HPF 0-5 0 - 5 RBC/hpf   WBC, UA 6-30 0 - 5 WBC/hpf   Bacteria, UA RARE (A) NONE SEEN   Squamous Epithelial / LPF 0-5 (A) NONE SEEN   Mucous PRESENT   Rapid urine drug screen (hospital performed)     Status: Abnormal   Collection Time: 02/12/17  1:34 PM  Result Value Ref Range   Opiates NONE DETECTED NONE DETECTED   Cocaine NONE DETECTED NONE DETECTED   Benzodiazepines POSITIVE (A) NONE DETECTED   Amphetamines NONE DETECTED NONE DETECTED   Tetrahydrocannabinol NONE DETECTED NONE DETECTED   Barbiturates NONE DETECTED NONE DETECTED    Comment:        DRUG SCREEN FOR MEDICAL PURPOSES ONLY.  IF CONFIRMATION IS NEEDED FOR ANY PURPOSE, NOTIFY LAB WITHIN 5 DAYS.        LOWEST DETECTABLE LIMITS FOR URINE DRUG SCREEN Drug Class       Cutoff (ng/mL) Amphetamine      1000 Barbiturate      200 Benzodiazepine    425 Tricyclics       956 Opiates          300 Cocaine          300 THC              50   Ethanol     Status: None   Collection Time: 02/12/17  3:02 PM  Result Value Ref Range   Alcohol, Ethyl (B) <5 <5 mg/dL    Comment:        LOWEST DETECTABLE LIMIT FOR SERUM ALCOHOL IS 5 mg/dL FOR MEDICAL PURPOSES ONLY   Lithium level     Status: None   Collection Time: 02/12/17  6:58 PM  Result Value Ref Range   Lithium Lvl 1.01 0.60 - 1.20 mmol/L    Current Facility-Administered Medications  Medication Dose Route Frequency Provider Last Rate Last Dose  . anastrozole (ARIMIDEX) tablet 1 mg  1 mg Oral Daily Duffy Bruce, MD   1 mg at 02/13/17 0943  . cholecalciferol (VITAMIN D) tablet 1,000 Units  1,000 Units Oral Daily Duffy Bruce, MD   1,000 Units at 02/13/17 0941  . diphenoxylate-atropine (LOMOTIL) 2.5-0.025 MG per tablet 1 tablet  1 tablet Oral QID PRN Duffy Bruce, MD      . gabapentin (NEURONTIN) capsule 300 mg  300 mg Oral TID Corena Pilgrim, MD      . heparin lock flush 100 unit/mL  500 Units Intracatheter Once Milton Ferguson, MD   Stopped at 02/12/17 1635  . ibuprofen (ADVIL,MOTRIN) tablet 600 mg  600 mg Oral Q6H PRN Duffy Bruce, MD      . Derrill Memo ON 02/14/2017] lamoTRIgine (LAMICTAL) tablet 50 mg  50 mg Oral Daily Mishelle Hassan, MD      . lithium carbonate (ESKALITH) CR tablet 450 mg  450 mg Oral QHS Connelly Spruell, MD      . loratadine (CLARITIN) tablet 10 mg  10 mg Oral Daily Duffy Bruce, MD   10 mg at 02/13/17 0942  . LORazepam (ATIVAN) tablet 0.5 mg  0.5 mg Oral Q8H PRN Duffy Bruce, MD   0.5 mg at 02/13/17 0837  . ondansetron (ZOFRAN) tablet 8 mg  8 mg Oral Q8H PRN Duffy Bruce, MD   8 mg at 02/12/17 2336  . pantoprazole (PROTONIX) EC tablet  40 mg  40 mg Oral Daily Duffy Bruce, MD   40 mg at 02/13/17 0940  . potassium chloride (K-DUR,KLOR-CON) CR tablet 10 mEq  10 mEq Oral Daily Duffy Bruce, MD   10 mEq at 02/13/17 0942  . traMADol (ULTRAM) tablet 50 mg   50 mg Oral Q6H PRN Duffy Bruce, MD   50 mg at 02/13/17 0837  . traZODone (DESYREL) tablet 150 mg  150 mg Oral QHS Corena Pilgrim, MD       Current Outpatient Prescriptions  Medication Sig Dispense Refill  . anastrozole (ARIMIDEX) 1 MG tablet Take 1 tablet (1 mg total) by mouth daily. 90 tablet 1  . Biotin 1 MG CAPS Take 1 mg by mouth daily.    . cetirizine (ZYRTEC) 10 MG tablet Take 1 tablet (10 mg total) by mouth daily. 30 tablet 11  . cholecalciferol (VITAMIN D) 1000 units tablet Take 1,000 Units by mouth daily.    . diphenhydrAMINE (BENADRYL) 25 MG tablet Take 25 mg by mouth every 6 (six) hours as needed for allergies.    . diphenoxylate-atropine (LOMOTIL) 2.5-0.025 MG tablet Take 1 tablet by mouth 4 (four) times daily as needed for diarrhea or loose stools. 40 tablet 0  . gabapentin (NEURONTIN) 300 MG capsule Take 600 mg by mouth 3 (three) times daily.    [name] ibuprofen (ADVIL,MOTRIN) 200 MG tablet Take 800 mg by mouth every 6 (six) hours as needed for headache, mild pain or moderate pain.     [name] lamoTRIgine (LAMICTAL) 25 MG tablet Take 50 mg by mouth 2 (two) times daily.    [name] lidocaine-prilocaine (EMLA) cream Apply 1 application topically as needed (prior to accessing port).    [name] lithium carbonate 300 MG capsule Take 300 mg by mouth 2 (two) times daily with a meal.    . LORazepam (ATIVAN) 0.5 MG tablet Take 1 tablet (0.5 mg total) by mouth every 8 (eight) hours as needed for anxiety. 90 tablet 0  . pantoprazole (PROTONIX) 40 MG tablet Take 40 mg by mouth daily.    . potassium chloride (MICRO-K) 10 MEQ CR capsule Take 1 capsule (10 mEq total) by mouth daily. 30 capsule 1  . traMADol (ULTRAM) 50 MG tablet Take 1 tablet (50 mg total) by mouth every 6 (six) hours as needed for moderate pain. 30 tablet 0  . traZODone (DESYREL) 150 MG tablet Take 300 mg by mouth at bedtime.    . lamoTRIgine (LAMICTAL) 100 MG tablet Take 1.5 tablets (150 mg total) by mouth 2 (two) times daily. (Patient not taking:  Reported on 02/12/2017) 60 tablet 1  . ondansetron (ZOFRAN) 8 MG tablet Take 1 tablet (8 mg total) by mouth every 8 (eight) hours as needed for nausea or vomiting. 30 tablet 0  . varenicline (CHANTIX) 0.5 MG tablet Take 1 tablet (0.5 mg total) by mouth daily. (Patient not taking: Reported on 01/22/2017) 30 tablet 3    Musculoskeletal: Strength & Muscle Tone: within normal limits Gait & Station: normal Patient leans: N/A  Psychiatric Specialty Exam: Physical Exam  Psychiatric: Her speech is normal. Judgment and thought content normal. Her mood appears anxious. She is slowed. Cognition and memory are normal.    Review of Systems  Constitutional: Positive for malaise/fatigue.  Eyes: Negative.   Respiratory: Negative.   Cardiovascular: Negative.   Gastrointestinal: Negative.   Genitourinary: Negative.   Skin: Negative.   Neurological: Negative.   Endo/Heme/Allergies: Negative.   Psychiatric/Behavioral: The patient is nervous/anxious.     Blood  pressure 130/75, pulse 91, temperature 98.1 F (36.7 C), temperature source Oral, resp. rate 18, SpO2 96 %.There is no height or weight on file to calculate BMI.  General Appearance: Casual  Eye Contact:  Good  Speech:  Clear and Coherent and Slow  Volume:  Normal  Mood:  Anxious  Affect:  Appropriate  Thought Process:  Coherent  Orientation:  Full (Time, Place, and Person)  Thought Content:  Logical  Suicidal Thoughts:  No  Homicidal Thoughts:  No  Memory:  Immediate;   Fair Recent;   Fair Remote;   Fair  Judgement:  Intact  Insight:  Fair  Psychomotor Activity:  Psychomotor Retardation  Concentration:  Concentration: Fair and Attention Span: Fair  Recall:  [employer] of Knowledge:  Fair  Language:  Good  Akathisia:  No  Handed:  Right  AIMS (if indicated):     Assets:  Communication Skills  ADL's:  Intact  Cognition:  WNL  Sleep:   fair     Treatment Plan Summary: Medication management  Re-start home medications   Patient will be discharged, does not meet criteria for admission  Disposition: No evidence of imminent risk to self or others at present.   Patient does not meet criteria for psychiatric inpatient admission. Refer to [facility] services of the [place] for outpatient follow up.  Corena Pilgrim, MD 02/13/2017 12:33 PM

## 2017-02-13 NOTE — ED Notes (Signed)
Right chest port deaccesed and flushed with heparin per order.

## 2017-02-13 NOTE — [facility] Suicide Risk Assessment (Cosign Needed)
Suicide Risk Assessment  Discharge Assessment   [facility] Discharge Suicide Risk Assessment   Principal Problem: Bipolar I disorder, most recent episode depressed [facility]) Discharge Diagnoses:  Patient Active Problem List   Diagnosis Date Noted  . Gait abnormality [R26.9] 01/06/2017  . Bipolar I disorder, most recent episode depressed ([place]) [F31.30] 12/08/2016  . Adjustment disorder with anxiety [F43.22] 12/06/2016  . Delirium due to another medical condition [F05] 12/03/2016  . Acute encephalopathy [G93.40] 12/03/2016  . Diarrhea [R19.7] 12/03/2016  . Metastatic breast cancer ([place]) [C50.919]   . Cerumen impaction [H61.20] 11/25/2016  . Otitis media [H66.90] 11/06/2016  . Brain metastasis ([place]) [C79.31] 07/27/2016  . Iron deficiency anemia [D50.9] 06/26/2016  . Bone metastases ([place]) [C79.51] 06/03/2016  . Primary cancer of lower-inner quadrant of left female breast ([place]) [C50.312] 06/01/2016  . Pap smear for cervical cancer screening [Z12.4] 03/28/2015  . Current smoker [F17.200] 03/28/2015  . Healthcare maintenance [Z00.00] 03/28/2015  . Seasonal allergies [J30.2] 03/28/2015  . Anxiety state [F41.1] 02/28/2015  . Fibromyalgia [M79.7] 02/28/2015  . Family history of diabetes mellitus [Z83.3] 02/28/2015  . H/O alcohol abuse [Z87.898]     Total Time spent with patient: 30 minutes   Musculoskeletal: Strength & Muscle Tone: within normal limits Gait & Station: normal Patient leans: N/A  Psychiatric Specialty Exam: Physical Exam  Psychiatric: Her speech is normal. Judgment and thought content normal. Her mood appears anxious. She is slowed. Cognition and memory are normal.    Review of Systems  Constitutional: Positive for malaise/fatigue.  Eyes: Negative.   Respiratory: Negative.   Cardiovascular: Negative.   Gastrointestinal: Negative.   Genitourinary: Negative.   Skin: Negative.   Neurological: Negative.   Endo/Heme/Allergies: Negative.   Psychiatric/Behavioral: The  patient is nervous/anxious.     Blood pressure 130/75, pulse 91, temperature 98.1 F (36.7 C), temperature source Oral, resp. rate 18, SpO2 96 %.There is no height or weight on file to calculate BMI.  General Appearance: Casual  Eye Contact:  Good  Speech:  Clear and Coherent and Slow  Volume:  Normal  Mood:  Anxious  Affect:  Appropriate  Thought Process:  Coherent  Orientation:  Full (Time, Place, and Person)  Thought Content:  Logical  Suicidal Thoughts:  No  Homicidal Thoughts:  No  Memory:  Immediate;   Fair Recent;   Fair Remote;   Fair  Judgement:  Intact  Insight:  Fair  Psychomotor Activity:  Psychomotor Retardation  Concentration:  Concentration: Fair and Attention Span: Fair  Recall:  [employer] of Knowledge:  Fair  Language:  Good  Akathisia:  No  Handed:  Right  AIMS (if indicated):     Assets:  Communication Skills  ADL's:  Intact  Cognition:  WNL  Sleep:   fair   Mental Status Per Nursing Assessment::   On Admission:     Demographic Factors:  Caucasian  Loss Factors: Decline in physical health  Historical Factors: Impulsivity  Risk Reduction Factors:   Living with another person, especially a relative  Continued Clinical Symptoms:  Previous Psychiatric Diagnoses and Treatments Medical Diagnoses and Treatments/Surgeries  Cognitive Features That Contribute To Risk:  None    Suicide Risk:  Minimal: No identifiable suicidal ideation.  Patients presenting with no risk factors but with morbid ruminations; may be classified as minimal risk based on the severity of the depressive symptoms  Follow-up Information    [facility]. Go on 02/15/2017.   Specialty:  Professional Counselor Contact information: [phone]  [place] [id number] [phone]           Plan Of Care/Follow-up recommendations:  Activity:  As tolerated Diet:  As tolerated Other:  Follow up with [facility] [place], [place], NP 02/13/2017, 12:59 PM

## 2017-02-13 NOTE — ED Notes (Signed)
Patient up to nurses station continuously asking for telephone use, water, medication.  Redirected patient to use nurse call bell instead of standing at nurses station.  Patient called for ride then asked for ativan prn which is not due at this time.

## 2017-02-13 NOTE — ED Notes (Signed)
Patient requesting pain medication for all over body pain.  Also requesting Ativan prn for her nerves.

## 2017-02-13 NOTE — ED Notes (Signed)
Patient's ride to be here in half an hour.

## 2017-02-15 ENCOUNTER — Telehealth: Payer: Self-pay

## 2017-02-15 ENCOUNTER — Ambulatory Visit: Payer: Medicaid Other

## 2017-02-15 ENCOUNTER — Other Ambulatory Visit: Payer: Self-pay | Admitting: Oncology

## 2017-02-15 ENCOUNTER — Telehealth: Payer: Self-pay | Admitting: Emergency Medicine

## 2017-02-15 NOTE — Telephone Encounter (Signed)
Patient called inquiring about what happened to her when she was here on 3/16; advised patient that it appears she was sent to the ED after her treatment to be evaluated for her anxiety and "bizarre behavior". Patient sounds alert and oriented at this time. Denies any problems. Reviewed upcoming appointments with patient to include upcoming MRI. Patient verbalized understanding.

## 2017-02-15 NOTE — Telephone Encounter (Signed)
Christa See, LCSW spoke to this CM regarding the status of the [facility] referral. She noted that that as per the patient she had been denied PCS services.  Call placed to [facility] # [phone] to check on the outcome of the referral. The call was made with Christa See. LCSW present and phone on speaker. As per Sonia Baller, [facility], the patient was denied because the referral noted that the patient is not medically stable and the patient must be medically stable in order to receive services.   Message routed to Raina Mina, RN

## 2017-02-16 ENCOUNTER — Telehealth: Payer: Self-pay | Admitting: Emergency Medicine

## 2017-02-16 NOTE — Telephone Encounter (Signed)
Patient left 2 messages on voicemail today asking for a call back  Called patient back; she asks, "am I ever going to feel normal" states that she doesn't feel right; states "I feel blah" Patient states she just wants to sleep; states decreased appetite. Patient states she has ate 1/4 of a [place] sandwich today and a yogurt; states she is trying to drink fluids.   Patient then asks; "what are yall doing today" Asks "what is Dr [name] doing".  Patient denies any emergent problems; advised patient to call back for any further concerns and to follow up as scheduled.

## 2017-02-17 ENCOUNTER — Telehealth: Payer: Self-pay | Admitting: *Deleted

## 2017-02-17 NOTE — Telephone Encounter (Signed)
This RN returned call to pt per 2 VM left by pt requesting a return call. Pt stated per call left at 915 am " emergent"  Second call left stating " I just need to speak with you "  This RN obtained number identified VM- message left to return call to this RN but pt may want to call her primary MD office per noted referral for home health services per recent ER visit.

## 2017-02-17 NOTE — Telephone Encounter (Signed)
See other entry 

## 2017-02-17 NOTE — Telephone Encounter (Signed)
New form faxed to [facility] for [facility] services yesterday acknowledging that patient's condition is stable. Checked with [place] today and they did receive form with status change.

## 2017-02-18 ENCOUNTER — Other Ambulatory Visit: Payer: Self-pay | Admitting: *Deleted

## 2017-02-18 ENCOUNTER — Encounter: Payer: Self-pay | Admitting: Family Medicine

## 2017-02-18 ENCOUNTER — Other Ambulatory Visit: Payer: Self-pay | Admitting: Family Medicine

## 2017-02-18 ENCOUNTER — Ambulatory Visit: Payer: Medicaid Other | Admitting: Licensed Clinical Social Worker

## 2017-02-18 ENCOUNTER — Ambulatory Visit: Payer: Medicaid Other | Attending: Family Medicine | Admitting: Family Medicine

## 2017-02-18 VITALS — BP 147/74 | HR 105 | Temp 97.8°F | Ht 65.0 in | Wt 129.0 lb

## 2017-02-18 DIAGNOSIS — F3130 Bipolar disorder, current episode depressed, mild or moderate severity, unspecified: Secondary | ICD-10-CM

## 2017-02-18 DIAGNOSIS — M797 Fibromyalgia: Secondary | ICD-10-CM | POA: Insufficient documentation

## 2017-02-18 DIAGNOSIS — F4310 Post-traumatic stress disorder, unspecified: Secondary | ICD-10-CM | POA: Diagnosis not present

## 2017-02-18 DIAGNOSIS — Z79811 Long term (current) use of aromatase inhibitors: Secondary | ICD-10-CM | POA: Insufficient documentation

## 2017-02-18 DIAGNOSIS — C7931 Secondary malignant neoplasm of brain: Secondary | ICD-10-CM | POA: Diagnosis not present

## 2017-02-18 DIAGNOSIS — Z888 Allergy status to other drugs, medicaments and biological substances status: Secondary | ICD-10-CM | POA: Diagnosis not present

## 2017-02-18 DIAGNOSIS — F13230 Sedative, hypnotic or anxiolytic dependence with withdrawal, uncomplicated: Secondary | ICD-10-CM | POA: Diagnosis not present

## 2017-02-18 DIAGNOSIS — K219 Gastro-esophageal reflux disease without esophagitis: Secondary | ICD-10-CM | POA: Diagnosis not present

## 2017-02-18 DIAGNOSIS — R636 Underweight: Secondary | ICD-10-CM | POA: Insufficient documentation

## 2017-02-18 DIAGNOSIS — Z923 Personal history of irradiation: Secondary | ICD-10-CM | POA: Diagnosis not present

## 2017-02-18 DIAGNOSIS — Z9889 Other specified postprocedural states: Secondary | ICD-10-CM | POA: Insufficient documentation

## 2017-02-18 DIAGNOSIS — C50919 Malignant neoplasm of unspecified site of unspecified female breast: Secondary | ICD-10-CM | POA: Diagnosis not present

## 2017-02-18 DIAGNOSIS — F319 Bipolar disorder, unspecified: Secondary | ICD-10-CM | POA: Diagnosis not present

## 2017-02-18 DIAGNOSIS — C7951 Secondary malignant neoplasm of bone: Secondary | ICD-10-CM | POA: Insufficient documentation

## 2017-02-18 DIAGNOSIS — F17200 Nicotine dependence, unspecified, uncomplicated: Secondary | ICD-10-CM

## 2017-02-18 DIAGNOSIS — G8929 Other chronic pain: Secondary | ICD-10-CM | POA: Diagnosis not present

## 2017-02-18 DIAGNOSIS — E876 Hypokalemia: Secondary | ICD-10-CM | POA: Insufficient documentation

## 2017-02-18 DIAGNOSIS — Z87891 Personal history of nicotine dependence: Secondary | ICD-10-CM | POA: Diagnosis not present

## 2017-02-18 DIAGNOSIS — R251 Tremor, unspecified: Secondary | ICD-10-CM | POA: Diagnosis not present

## 2017-02-18 DIAGNOSIS — F13930 Sedative, hypnotic or anxiolytic use, unspecified with withdrawal, uncomplicated: Secondary | ICD-10-CM

## 2017-02-18 DIAGNOSIS — Z9012 Acquired absence of left breast and nipple: Secondary | ICD-10-CM | POA: Insufficient documentation

## 2017-02-18 DIAGNOSIS — R410 Disorientation, unspecified: Secondary | ICD-10-CM | POA: Diagnosis not present

## 2017-02-18 DIAGNOSIS — Z853 Personal history of malignant neoplasm of breast: Secondary | ICD-10-CM | POA: Insufficient documentation

## 2017-02-18 DIAGNOSIS — R110 Nausea: Secondary | ICD-10-CM

## 2017-02-18 DIAGNOSIS — C50312 Malignant neoplasm of lower-inner quadrant of left female breast: Secondary | ICD-10-CM | POA: Diagnosis not present

## 2017-02-18 LAB — BASIC METABOLIC PANEL
BUN: 7 mg/dL (ref 7–25)
CO2: 21 mmol/L (ref 20–31)
Calcium: 9.9 mg/dL (ref 8.6–10.4)
Chloride: 105 mmol/L (ref 98–110)
Creat: 0.71 mg/dL (ref 0.50–1.05)
Glucose, Bld: 111 mg/dL — ABNORMAL HIGH (ref 65–99)
Potassium: 3.9 mmol/L (ref 3.5–5.3)
Sodium: 139 mmol/L (ref 135–146)

## 2017-02-18 MED ORDER — POTASSIUM CHLORIDE ER 10 MEQ PO CPCR: 10.0000 meq | ORAL_CAPSULE | Freq: Every day | ORAL | 3 refills | Status: DC

## 2017-02-18 MED ORDER — ONDANSETRON HCL 8 MG PO TABS: 8.0000 mg | ORAL_TABLET | Freq: Three times a day (TID) | ORAL | 1 refills | Status: DC | PRN

## 2017-02-18 MED ORDER — LORAZEPAM 0.5 MG PO TABS: 0.5000 mg | ORAL_TABLET | Freq: Two times a day (BID) | ORAL | 0 refills | Status: DC | PRN

## 2017-02-18 NOTE — Patient Instructions (Addendum)
Appointment with Psychiatry- Dr Corena Pilgrim on 02/22/17 at 12:45 PM at [facility]. [address] [place], and  [id number].  Take along a picture ID and insurance card.        Bipolar 1 Disorder Bipolar 1 disorder is a mental health disorder in which a person has episodes of emotional highs (mania), and may also have episodes of emotional lows (depression) in addition to highs. Bipolar 1 disorder is different from other bipolar disorders because it involves extreme manic episodes. These episodes last at least one week or involve symptoms that are so severe that hospitalization is needed to keep the person safe. What increases the risk? The cause of this condition is not known. However, certain factors make you more likely to have bipolar disorder, such as:  Having a family member with the disorder.  An imbalance of certain chemicals in the brain (neurotransmitters).  Stress, such as illness, financial problems, or a death.  Certain conditions that affect the brain or spinal cord (neurologic conditions).  Brain injury (trauma).  Having another mental health disorder, such as:  Obsessive compulsive disorder.  Schizophrenia. What are the signs or symptoms? Symptoms of mania include:  Very high self-esteem or self-confidence.  Decreased need for sleep.  Unusual talkativeness or feeling a need to keep talking. Speech may be very fast. It may seem like you cannot stop talking.  Racing thoughts or constant talking, with quick shifts between topics that may or may not be related (flight of ideas).  Decreased ability to focus or concentrate.  Increased purposeful activity, such as work, studies, or social activity.  Increased nonproductive activity. This could be pacing, squirming and fidgeting, or finger and toe tapping.  Impulsive behavior and poor judgment. This may result in high-risk activities, such as having unprotected sex or spending a lot of money. Symptoms of depression  include:  Feeling sad, hopeless, or helpless.  Frequent or uncontrollable crying.  Lack of feeling or caring about anything.  Sleeping too much.  Moving more slowly than usual.  Not being able to enjoy things you used to enjoy.  Wanting to be alone all the time.  Feeling guilty or worthless.  Lack of energy or motivation.  Trouble concentrating or remembering.  Trouble making decisions.  Increased appetite.  Thoughts of death, or the desire to harm yourself. Sometimes, you may have a mixed mood. This means having symptoms of depression and mania. Stress can make symptoms worse. How is this diagnosed? To diagnose bipolar disorder, your health care provider may ask about your:  Emotional episodes.  Medical history.  Alcohol and drug use. This includes prescription medicines. Certain medical conditions and substances can cause symptoms that seem like bipolar disorder (secondary bipolar disorder). How is this treated? Bipolar disorder is a long-term (chronic) illness. It is best controlled with ongoing (continuous) treatment rather than treatment only when symptoms occur. Treatment may include:  Medicine. Medicine can be prescribed by a provider who specializes in treating mental disorders (psychiatrist).  Medicines called mood stabilizers are usually prescribed.  If symptoms occur even while taking a mood stabilizer, other medicines may be added.  Psychotherapy. Some forms of talk therapy, such as cognitive-behavioral therapy (CBT), can provide support, education, and guidance.  Coping methods, such as journaling or relaxation exercises. These may include:  Yoga.  Meditation.  Deep breathing.  Lifestyle changes, such as:  Limiting alcohol and drug use.  Exercising regularly.  Getting plenty of sleep.  Making healthy eating choices. A combination of medicine, talk  therapy, and coping methods is best. A procedure in which electricity is applied to the brain  through the scalp (electroconvulsive therapy) may be used in cases of severe mania when medicine and psychotherapy work too slowly or do not work. Follow these instructions at home: Activity    Return to your normal activities as told by your health care provider.  Find activities that you enjoy, and make time to do them.  Exercise regularly as told by your health care provider. Lifestyle   Limit alcohol intake to no more than 1 drink a day for nonpregnant women and 2 drinks a day for men. One drink equals 12 oz of beer, 5 oz of wine, or 1 oz of hard liquor.  Follow a set schedule for eating and sleeping.  Eat a balanced diet that includes fresh fruits and vegetables, whole grains, low-fat dairy, and lean meat.  Get 7-8 hours of sleep each night. General instructions   Take over-the-counter and prescription medicines only as told by your health care provider.  Think about joining a support group. Your health care provider may be able to recommend a support group.  Talk with your family and loved ones about your treatment goals and how they can help.  Keep all follow-up visits as told by your health care provider. This is important. Where to find more information: For more information about bipolar disorder, visit the following websites:  [employer] on Mental Illness: www.nami.[place]: https://carter.com/ Contact a health care provider if:  Your symptoms get worse.  You have side effects from your medicine, and they get worse.  You have trouble sleeping.  You have trouble doing daily activities.  You feel unsafe in your surroundings.  You are dealing with substance abuse. Get help right away if:  You have new symptoms.  You have thoughts about harming yourself.  You self-harm. This information is not intended to replace advice given to you by your health care provider. Make sure you discuss any questions you have with  your health care provider. Document Released: 02/22/2001 Document Revised: 07/12/2016 Document Reviewed: 07/16/2016 Elsevier Interactive Patient Education  2017 [employer].

## 2017-02-18 NOTE — Progress Notes (Signed)
Subjective:    Patient ID: [name], female    DOB: October 31, 1961, 56 y.o.   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  She comes in today and is teary, feeling very depressed with worsening of her tremor because she has been out of her lorazepam for the last 1 week and has been unable to see family services of the [place]. She continues to take Lamictal, trazodone and gabapentin.  She complains that ever since her brain surgery in 07/2016 she has had intermittent gait abnormalities leading to her using a walker, she has had intermittent confusion (with hospitalizations for same) and has had tremors at rest. Denies alcohol consumption. MRI of the brain from last month was negative for active brain disease. She states prior to her brain surgery she was highly functional and able to work. Last visit to oncology was underweight/25/18.  Her appetite is not so great but she reports feeling well overall.  Past Medical History:  Diagnosis Date  . Alcohol abuse   . Anemia    during chemo  . Anxiety    At age 28  . Arthritis Dx 2010  . Bipolar disorder ([place])   . Cancer ([place])    breast mets to brain  . Chronic pain   . Complication of anesthesia   . Depression   . Fibromyalgia Dx 2005  . GERD (gastroesophageal reflux disease)   . Headache    hx  migraines  . Opiate dependence ([place])   . PONV (postoperative nausea and vomiting)   . Port-a-cath in place   . PTSD (post-traumatic stress disorder)     Past Surgical History:  Procedure Laterality Date  . APPLICATION OF CRANIAL NAVIGATION N/A 08/14/2016   Procedure: APPLICATION OF CRANIAL NAVIGATION;  Surgeon:  Erline Levine, MD;  Location: [facility];  Service: Neurosurgery;  Laterality: N/A;  . BREAST RECONSTRUCTION Left    with silicone implant  . CRANIOTOMY N/A 08/14/2016   Procedure: CRANIOTOMY TUMOR EXCISION WITH Lucky Rathke;  Surgeon: Erline Levine, MD;  Location: [facility];  Service: Neurosurgery;  Laterality: N/A;  . FIBULA FRACTURE SURGERY Left   . MASTECTOMY Left   . RADIOLOGY WITH ANESTHESIA N/A 07/23/2016   Procedure: MRI OF BRAIN WITH AND WITHOUT;  Surgeon: Medication Radiologist, MD;  Location: [facility];  Service: Radiology;  Laterality: N/A;  . RADIOLOGY WITH ANESTHESIA N/A 09/08/2016   Procedure: MRI OF BRAIN WITH AND WITHOUT CONTRAST;  Surgeon: Medication Radiologist, MD;  Location: [facility];  Service: Radiology;  Laterality: N/A;  . RADIOLOGY WITH ANESTHESIA N/A 12/10/2016   Procedure: MRI OF BRAIN WITH AND WITHOUT;  Surgeon: Medication Radiologist, MD;  Location: [facility];  Service: Radiology;  Laterality: N/A;  . right power port placement Right     Allergies  Allergen Reactions  . Demerol Itching and Nausea And Vomiting  . Erythromycin Rash     Review of Systems Constitutional: Positive for fatigue. Negative for activity change and appetite change.  HENT: Negative for congestion, sinus pressure and sore throat.   Eyes: Negative for visual disturbance.  Respiratory: Negative for cough, chest tightness, shortness of breath and wheezing.   Cardiovascular: Negative for chest pain and palpitations.  Gastrointestinal: Negative for abdominal distention, abdominal pain and constipation.  Endocrine: Negative for polydipsia.  Genitourinary: Negative for dysuria and frequency.  Musculoskeletal: Negative for arthralgias and back pain.  Skin: Negative for rash.  Neurological: Positive for dizziness, tremors and weakness. Negative for light-headedness and numbness.  Hematological: Does not bruise/bleed easily.  Psychiatric/Behavioral: Significant for anxiety and depression.      Objective: Vitals:   02/18/17 0840  BP: (!) 147/74  Pulse: (!) 105  Temp: 97.8 F (36.6 C)  TempSrc: Oral  SpO2: 98%  Weight: 129 lb (58.5 kg)  Height: 5\' 5"  (1.651 m)      Physical Exam Constitutional: She is oriented to person, place, and time. She appears well-developed and well-nourished.  Cardiovascular: Normal heart sounds and intact distal pulses.  Tachycardia present.   No murmur heard. Pulmonary/Chest: Effort normal and breath sounds normal. She has no wheezes. She has no rales. She exhibits no tenderness.  Abdominal: Soft. Bowel sounds are normal. She exhibits no distension and no mass. There is no tenderness.  Musculoskeletal: Normal range of motion.  Neurological: She is alert and oriented to person, place, and time.  Unstable gait Motor strength-5/5 in all extremities Psych :Anxious, teary       Assessment & Plan:  1. Hypokalemia Continue potassium supplements - Basic Metabolic Panel  2. Primary cancer of lower-inner quadrant of left female breast ([place]) Status post L mastectomy, axillary lymph node dissection and radiation - no active disease evident radiologically as per oncology but disease is not curable. Continue Anastrozole and Trastuzumab Keep appointment with oncology.  3. Metastatic breast cancer ([place]) Metastasis to the brain status post craniotomy, s/p brain radiation Currently with gait abnormalities, occasional tremors MRI brain from 11/2016 - no active disease in the brain Scheduled for repeat MRI of the brain in 2 months to evaluate for recurrence. Patient encouraged to schedule appointment with her brain surgeon ?might need pulse steroids  4. Current smoker Spent 5 minutes counseling on cessation and she is not very motivated to quit. She does have Chantix prescription at the pharmacy  5. Bipolar I disorder, most recent episode depressed ([place]) Never made it to family services of the [place] Taking Lamictal, trazodone LCSW called in for  therapy session. Appointment with psychiatry -Monday 02/22/17 at 12:45 PM   6. Benzodiazepam withdrawal Given 10 pills of lorazepam until her appointment with psychiatry.

## 2017-02-18 NOTE — Progress Notes (Signed)
Requesting medication refills- wants to discuss with MD Crying- severely depressed Feels alone- no family or friends in the area

## 2017-02-19 LAB — DRUG ABUSE PANEL 10-50, U
AMPHETAMINES (1000 ng/mL SCRN): NEGATIVE
BARBITURATES: NEGATIVE
BENZODIAZEPINES: NEGATIVE
COCAINE METABOLITES: NEGATIVE
MARIJUANA MET (50 ng/mL SCRN): NEGATIVE
METHADONE: NEGATIVE
METHAQUALONE: NEGATIVE
OPIATES: NEGATIVE
PHENCYCLIDINE: NEGATIVE
PROPOXYPHENE: NEGATIVE

## 2017-02-19 NOTE — BH Specialist Note (Signed)
Integrated Behavioral Health Initial Visit  MRN: [id number] Name: [name]   Session Start time: 10:00 AM Session End time: 10:30 AM Total time: 30 minutes  Type of Service: [place] Interpretor:No. Interpretor Name and Language: N/A   Warm Hand Off Completed.       SUBJECTIVE: [name] is a 56 y.o. female accompanied by patient. Patient was referred by Dr. Jarold Song for depression and anxiety. Patient reports the following symptoms/concerns: overwhelming feelings of sadness and worry, difficulty sleeping, isolation from family and friends, difficulty focusing, confusion, racing thoughts, and irritability Duration of problem: Pt reports being diagnosed with Bipolar, Anxiety, and Depression in 2013. Onset of symptoms occurred December 2017; Severity of problem: severe  OBJECTIVE: Mood: Anxious and Irritable and Affect: Depressed and Tearful Risk of harm to self or others: No plan to harm self or others   LIFE CONTEXT: Family and Social: Pt resides at a transitional living facility with two other residents. Pt's father, sisters, and two sons reside approx two hours away in different parts of [place] School/Work: Pt receives [employer] and Physicist, medical [phone]) Self-Care: Pt has hx of substance abuse; however, is actively sober. Pt smokes cigarettes. Life Changes: Pt has been diagnosed with breast and brain cancer. She underwent brain surgery September 2017 which has resulted in decreased motor skills, coordination, and manic behavior  GOALS ADDRESSED: Patient will reduce symptoms of: anxiety and depression and increase knowledge and/or ability of: coping skills and also: Increase adequate support systems for patient/family   INTERVENTIONS: Supportive Counseling  Standardized Assessments completed: GAD-7, PHQ 2 and PHQ 9  ASSESSMENT: Patient currently experiencing depression and anxiety triggered by ongoing medical concerns that has resulted in a  decrease of motor skills, coordination, and manic behavior. Patient reports overwhelming feelings of sadness and worry, difficulty sleeping, isolation from family and friends, difficulty focusing, confusion, racing thoughts, and irritability. Patient may benefit from psychotherapy, medication management, and an increase in support systems in the community. Patient verbalized feeling overwhelmed with managing her medications and having limited support in the community. LCSWA provided encouragement and support. LCSWA informed pt that a new PCS referral has been completed. Pt was appreciative. LCSWA encouraged pt to continue applying healthy strategies on a daily/weekly basis to decrease symptoms. Pt has a scheduled appointment with Dr. Corena Pilgrim on 02/22/17 for psychiatry. Pt did not disclose any barriers in attending appointment stating [name] [phone], will provide transportation.   PLAN: 1. Follow up with behavioral health clinician on : Pt was encouraged tocontact LCSWA if symptoms worsen or fail to improveto schedule behavioral appointments at [facility]. 2. Behavioral recommendations: LCSWA recommends that pt apply healthy coping skills discussed, follow through with psych appointment, and utilize community resources. Pt is encouraged to schedule follow up appointment with LCSWA 3. Referral(s): Psychiatrist and [place] 4. "From scale of 1-10, how likely are you to follow plan?": 8/10  Rebekah [place], LCSW 02/19/17 10:30 AM

## 2017-02-21 LAB — DRUG SCREEN 10 W/CONF, SERUM

## 2017-02-23 ENCOUNTER — Telehealth: Payer: Self-pay | Admitting: *Deleted

## 2017-02-23 NOTE — Telephone Encounter (Signed)
This RN spoke with [name] per her walk in to the office.  [name] wanted to get encouragement , verify appointments and discuss her plans.  Note [name] was more congruent in mannerisms, thought process and verbalization today.  She was able to state upcoming appointment for 4/6.  She verified she was seen by psychiatrist today- stated no change in medication with goal is to have a home health nurse assist with her medication management.  [name] stated concern with upcoming brain scans and concern for cancer recurrence due to " I am so loopy "  This RN informed [name] from a cancer point of view she does not have symptoms that concern [name] for recurrence- her thought process may have been interrupted by brain surgery, radiation and use of steroids as well as she has not been taking her mental health medications consistently for best outcome.  [name] verbalized understanding of above.  Per plan for how she can be more motivated she would like to obtain information on SCAT and see what support groups she can participate in.  [name] will speak with SW for SCAT information. This note will be forwarded to Support Services to follow up on programs offered thru this office or other community resources. She will be having a home health nurse visit per primary care through [employer]. She stated short term goal this week is to take her mental health medication every day.  Conversation ended with this RN with [name] stating appreication of discussion. [name] was left in waiting room with snacks provided by volunteer. No other needs at this time.

## 2017-02-24 ENCOUNTER — Encounter: Payer: Self-pay | Admitting: *Deleted

## 2017-02-24 NOTE — Progress Notes (Signed)
[name]  Clinical Social Work was referred by nurse patient for assessment of psychosocial needs, possible SCAT assistance.  CSW team applied for SCAT for pt back 0n 12/29/16. Pt can use SCAT and/or medicaid transportation to get to medical appointments. Several CSWs have provided pt education on these resources on several occassions. Clinical Social Worker attempted to contact patient at home to readdress and offer support and assess for needs.  CSW left message for pt with SCAT and medicaid contact numbers. If pt is approved for CNA through [facility], they may be able to ensure pt actually uses these options as needed. This CSW has worked with pts in the past that have had assistance from CNA in arranging rides to medical appointments. CSW to follow closely and assist as needed.    Clinical Social Work interventions:  Resource education and referral  Loren Racer, LCSW, OSW-C Clinical Social Worker [place]  [place] Phone: [phone] Fax: [phone]

## 2017-02-25 ENCOUNTER — Inpatient Hospital Stay (HOSPITAL_COMMUNITY)
Admission: EM | Admit: 2017-02-25 | Discharge: 2017-03-03 | DRG: 071 | Disposition: A | Discharge status: Home Health | Payer: Medicaid Other | Attending: Nephrology | Admitting: Nephrology

## 2017-02-25 ENCOUNTER — Encounter (HOSPITAL_COMMUNITY): Payer: Self-pay

## 2017-02-25 ENCOUNTER — Emergency Department (HOSPITAL_COMMUNITY): Payer: Medicaid Other

## 2017-02-25 DIAGNOSIS — Z9114 Patient's other noncompliance with medication regimen: Secondary | ICD-10-CM

## 2017-02-25 DIAGNOSIS — R410 Disorientation, unspecified: Secondary | ICD-10-CM | POA: Diagnosis present

## 2017-02-25 DIAGNOSIS — F319 Bipolar disorder, unspecified: Secondary | ICD-10-CM | POA: Diagnosis present

## 2017-02-25 DIAGNOSIS — Z853 Personal history of malignant neoplasm of breast: Secondary | ICD-10-CM

## 2017-02-25 DIAGNOSIS — C50919 Malignant neoplasm of unspecified site of unspecified female breast: Secondary | ICD-10-CM | POA: Diagnosis present

## 2017-02-25 DIAGNOSIS — I959 Hypotension, unspecified: Secondary | ICD-10-CM | POA: Diagnosis present

## 2017-02-25 DIAGNOSIS — C7931 Secondary malignant neoplasm of brain: Secondary | ICD-10-CM | POA: Diagnosis present

## 2017-02-25 DIAGNOSIS — G9340 Encephalopathy, unspecified: Secondary | ICD-10-CM | POA: Diagnosis not present

## 2017-02-25 DIAGNOSIS — Z9012 Acquired absence of left breast and nipple: Secondary | ICD-10-CM

## 2017-02-25 DIAGNOSIS — Z833 Family history of diabetes mellitus: Secondary | ICD-10-CM

## 2017-02-25 DIAGNOSIS — C7951 Secondary malignant neoplasm of bone: Secondary | ICD-10-CM | POA: Diagnosis present

## 2017-02-25 DIAGNOSIS — F1011 Alcohol abuse, in remission: Secondary | ICD-10-CM

## 2017-02-25 DIAGNOSIS — K219 Gastro-esophageal reflux disease without esophagitis: Secondary | ICD-10-CM | POA: Diagnosis present

## 2017-02-25 DIAGNOSIS — F40240 Claustrophobia: Secondary | ICD-10-CM | POA: Diagnosis present

## 2017-02-25 DIAGNOSIS — Z8249 Family history of ischemic heart disease and other diseases of the circulatory system: Secondary | ICD-10-CM

## 2017-02-25 DIAGNOSIS — F3130 Bipolar disorder, current episode depressed, mild or moderate severity, unspecified: Secondary | ICD-10-CM | POA: Diagnosis not present

## 2017-02-25 DIAGNOSIS — Z79811 Long term (current) use of aromatase inhibitors: Secondary | ICD-10-CM

## 2017-02-25 DIAGNOSIS — Z9221 Personal history of antineoplastic chemotherapy: Secondary | ICD-10-CM

## 2017-02-25 DIAGNOSIS — F17210 Nicotine dependence, cigarettes, uncomplicated: Secondary | ICD-10-CM | POA: Diagnosis present

## 2017-02-25 DIAGNOSIS — N390 Urinary tract infection, site not specified: Secondary | ICD-10-CM

## 2017-02-25 DIAGNOSIS — F05 Delirium due to known physiological condition: Secondary | ICD-10-CM | POA: Diagnosis present

## 2017-02-25 DIAGNOSIS — Z923 Personal history of irradiation: Secondary | ICD-10-CM

## 2017-02-25 DIAGNOSIS — Z8589 Personal history of malignant neoplasm of other organs and systems: Secondary | ICD-10-CM | POA: Diagnosis present

## 2017-02-25 DIAGNOSIS — Z85841 Personal history of malignant neoplasm of brain: Secondary | ICD-10-CM | POA: Diagnosis present

## 2017-02-25 LAB — COMPREHENSIVE METABOLIC PANEL
ALT: 15 U/L (ref 14–54)
AST: 22 U/L (ref 15–41)
Albumin: 4.3 g/dL (ref 3.5–5.0)
Alkaline Phosphatase: 39 U/L (ref 38–126)
Anion gap: 7 (ref 5–15)
BUN: <5 mg/dL — ABNORMAL LOW (ref 6–20)
CO2: 28 mmol/L (ref 22–32)
Calcium: 10.2 mg/dL (ref 8.9–10.3)
Chloride: 105 mmol/L (ref 101–111)
Creatinine, Ser: 0.81 mg/dL (ref 0.44–1.00)
GFR calc Af Amer: >60 mL/min (ref >60)
GFR calc non Af Amer: >60 mL/min (ref >60)
Glucose, Bld: 109 mg/dL — ABNORMAL HIGH (ref 65–99)
Potassium: 3.8 mmol/L (ref 3.5–5.1)
Sodium: 140 mmol/L (ref 135–145)
Total Bilirubin: 0.6 mg/dL (ref 0.3–1.2)
Total Protein: 6.7 g/dL (ref 6.5–8.1)

## 2017-02-25 LAB — URINALYSIS, ROUTINE W REFLEX MICROSCOPIC
Bacteria, UA: NONE SEEN
Bilirubin Urine: NEGATIVE
Glucose, UA: NEGATIVE mg/dL
Hgb urine dipstick: NEGATIVE
Ketones, ur: 5 mg/dL — AB
Nitrite: NEGATIVE
Protein, ur: NEGATIVE mg/dL
Specific Gravity, Urine: 1.006 (ref 1.005–1.030)
Squamous Epithelial / HPF: NONE SEEN
pH: 8 (ref 5.0–8.0)

## 2017-02-25 LAB — CBC
HCT: 39.3 % (ref 36.0–46.0)
Hemoglobin: 12.5 g/dL (ref 12.0–15.0)
MCH: 30.9 pg (ref 26.0–34.0)
MCHC: 31.8 g/dL (ref 30.0–36.0)
MCV: 97.3 fL (ref 78.0–100.0)
Platelets: 181 10*3/uL (ref 150–400)
RBC: 4.04 MIL/uL (ref 3.87–5.11)
RDW: 12.5 % (ref 11.5–15.5)
WBC: 4.7 10*3/uL (ref 4.0–10.5)

## 2017-02-25 LAB — RAPID URINE DRUG SCREEN, HOSP PERFORMED
Amphetamines: NOT DETECTED
Barbiturates: NOT DETECTED
Benzodiazepines: NOT DETECTED
Cocaine: NOT DETECTED
Opiates: NOT DETECTED
Tetrahydrocannabinol: NOT DETECTED

## 2017-02-25 LAB — LITHIUM LEVEL: Lithium Lvl: 0.85 mmol/L (ref 0.60–1.20)

## 2017-02-25 LAB — CBG MONITORING, ED: Glucose-Capillary: 85 mg/dL (ref 65–99)

## 2017-02-25 LAB — I-STAT CG4 LACTIC ACID, ED: Lactic Acid, Venous: 0.76 mmol/L (ref 0.5–1.9)

## 2017-02-25 MED ORDER — ENOXAPARIN SODIUM 40 MG/0.4ML ~~LOC~~ SOLN
40.0000 mg | SUBCUTANEOUS | Status: DC
Start: 2017-02-25 — End: 2017-03-03
  Administered 2017-02-25 – 2017-03-02 (×6): 40 mg via SUBCUTANEOUS
  Filled 2017-02-25 (×6): qty 0.4

## 2017-02-25 MED ORDER — LORAZEPAM 1 MG PO TABS
0.5000 mg | ORAL_TABLET | Freq: Once | ORAL | Status: AC
  Administered 2017-02-25: 0.5 mg via ORAL
  Filled 2017-02-25: qty 1

## 2017-02-25 MED ORDER — LORATADINE 10 MG PO TABS
10.0000 mg | ORAL_TABLET | Freq: Every day | ORAL | Status: DC
  Administered 2017-02-26 – 2017-03-03 (×6): 10 mg via ORAL
  Filled 2017-02-25 (×6): qty 1

## 2017-02-25 MED ORDER — IBUPROFEN 800 MG PO TABS: 800.0000 mg | ORAL_TABLET | Freq: Three times a day (TID) | ORAL | Status: DC | PRN

## 2017-02-25 MED ORDER — LIDOCAINE-PRILOCAINE 2.5-2.5 % EX CREA: 1.0000 "application " | TOPICAL_CREAM | CUTANEOUS | Status: DC | PRN

## 2017-02-25 MED ORDER — SODIUM CHLORIDE 0.9% FLUSH: 3.0000 mL | INTRAVENOUS | Status: DC | PRN

## 2017-02-25 MED ORDER — SODIUM CHLORIDE 0.9 % IV SOLN: 250.0000 mL | INTRAVENOUS | Status: DC | PRN

## 2017-02-25 MED ORDER — TRAZODONE HCL 100 MG PO TABS
200.0000 mg | ORAL_TABLET | Freq: Every day | ORAL | Status: DC
  Administered 2017-02-25 – 2017-03-02 (×6): 200 mg via ORAL
  Filled 2017-02-25 (×6): qty 2

## 2017-02-25 MED ORDER — ONDANSETRON HCL 4 MG/2ML IJ SOLN
4.0000 mg | Freq: Four times a day (QID) | INTRAMUSCULAR | Status: DC | PRN
  Administered 2017-03-01: 4 mg via INTRAVENOUS
  Filled 2017-02-25: qty 2

## 2017-02-25 MED ORDER — ANASTROZOLE 1 MG PO TABS
1.0000 mg | ORAL_TABLET | Freq: Every day | ORAL | Status: DC
  Administered 2017-02-26 – 2017-03-03 (×6): 1 mg via ORAL
  Filled 2017-02-25 (×6): qty 1

## 2017-02-25 MED ORDER — POTASSIUM CHLORIDE CRYS ER 10 MEQ PO TBCR
10.0000 meq | EXTENDED_RELEASE_TABLET | Freq: Every day | ORAL | Status: DC
  Administered 2017-02-26 – 2017-03-03 (×6): 10 meq via ORAL
  Filled 2017-02-25 (×6): qty 1

## 2017-02-25 MED ORDER — ONDANSETRON HCL 4 MG PO TABS
4.0000 mg | ORAL_TABLET | Freq: Four times a day (QID) | ORAL | Status: DC | PRN
  Administered 2017-02-28: 4 mg via ORAL
  Filled 2017-02-25: qty 1

## 2017-02-25 MED ORDER — BIOTIN 1 MG PO CAPS: 1.0000 mg | ORAL_CAPSULE | Freq: Every day | ORAL | Status: DC

## 2017-02-25 MED ORDER — GABAPENTIN 300 MG PO CAPS
600.0000 mg | ORAL_CAPSULE | Freq: Three times a day (TID) | ORAL | Status: DC
  Administered 2017-02-25 – 2017-03-03 (×15): 600 mg via ORAL
  Filled 2017-02-25 (×15): qty 2

## 2017-02-25 MED ORDER — SODIUM CHLORIDE 0.9% FLUSH
3.0000 mL | Freq: Two times a day (BID) | INTRAVENOUS | Status: DC
  Administered 2017-02-25 – 2017-03-03 (×10): 3 mL via INTRAVENOUS

## 2017-02-25 MED ORDER — LITHIUM CARBONATE 300 MG PO CAPS
300.0000 mg | ORAL_CAPSULE | Freq: Two times a day (BID) | ORAL | Status: DC
  Administered 2017-02-26 – 2017-03-03 (×10): 300 mg via ORAL
  Filled 2017-02-25 (×13): qty 1

## 2017-02-25 MED ORDER — LAMOTRIGINE 100 MG PO TABS
100.0000 mg | ORAL_TABLET | Freq: Every day | ORAL | Status: DC
  Administered 2017-02-26 – 2017-02-27 (×2): 100 mg via ORAL
  Filled 2017-02-25 (×2): qty 1
  Filled 2017-02-25: qty 4

## 2017-02-25 MED ORDER — PANTOPRAZOLE SODIUM 40 MG PO TBEC
40.0000 mg | DELAYED_RELEASE_TABLET | Freq: Every day | ORAL | Status: DC
  Administered 2017-02-26 – 2017-03-03 (×6): 40 mg via ORAL
  Filled 2017-02-25 (×6): qty 1

## 2017-02-25 NOTE — ED Notes (Signed)
Patient is continually getting up in the waiting room and walked outside unassisted and had a witnessed fall.  She fell onto her buttock area and did not hit head.  Pt was easily assisted to wheelchair and without any known injury.  Notified CN and Dr. Ashok Cordia.  Completed post fall assessment.

## 2017-02-25 NOTE — ED Notes (Signed)
Pt escorted to restroom to give urine specimen in wheelchair by [place] EMT.

## 2017-02-25 NOTE — ED Triage Notes (Addendum)
Patient arrived by [facility] after being called by patients room mate that she has been acting altered for several days. Patient has brain mets from breast cancer and  has had difficulty with memory and speech. Patient complains of chronic back pain on arrival. Alert to person on arrival

## 2017-02-25 NOTE — ED Notes (Signed)
Dr Irick in room 

## 2017-02-25 NOTE — ED Notes (Signed)
Pt is consistently up and has been re-educated.  Gone out to smoke several times.  She wants to leave but trying to convince her to stay

## 2017-02-25 NOTE — ED Notes (Signed)
Assisted patient back into the wheelchair and provided blanket.  Pt was standing up and unsteady.

## 2017-02-25 NOTE — BH Assessment (Signed)
Tele Assessment Note   [name] is an 56 y.o. female. Pt was not oriented. Pt was confused and agitated. Pt's speech was tangential. Pt was trying to leave the room during the assessment. Pt could not answer the assessment question. EMS was called by the Pt's roommate due to the Pt being disoriented. Pt recently had brain surgery due to cancer.   Collateral Contact: Information obtained from Pt's son [name]. Per EPIC note Mr. [name] is the Pt's POA but he was not certain. Per Mr. [name] the Pt became confused and disoriented after her brain surgery. Mr. [name] states that the Pt has breast cancer, she was then in remission, and the cancer was recently found in her brain. Mr. [name] states that a SW has been working with the Pt to assist with daily care. Mr. [name] did not know the who SW. Mr. [name] was unclear of what medications and what type of cancer care the Pt is currently receiving.   Per Dr. Marchia Bond Pt's symptoms are not psychological. Dr. Marchia Bond states that Pt is psych cleared and SW should be consulted.    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 Alcohol abuse   . Anemia    during chemo  . Anxiety    At age 53  . Arthritis Dx 2010  . Bipolar disorder ([place])   . Cancer ([place])    breast mets to brain  . Chronic pain   . Complication of anesthesia   . Depression   . Fibromyalgia Dx 2005  . GERD (gastroesophageal reflux disease)   . Headache    hx  migraines  . Opiate dependence ([place])   . PONV (postoperative nausea and vomiting)   . Port-a-cath in place   . PTSD (post-traumatic stress disorder)     Past Surgical History:  Procedure Laterality Date  . APPLICATION OF CRANIAL NAVIGATION N/A 08/14/2016   Procedure: APPLICATION OF CRANIAL NAVIGATION;  Surgeon: Erline Levine, MD;  Location: [place] [facility];  Service: Neurosurgery;  Laterality: N/A;  . BREAST RECONSTRUCTION  Left    with silicone implant  . CRANIOTOMY N/A 08/14/2016   Procedure: CRANIOTOMY TUMOR EXCISION WITH Lucky Rathke;  Surgeon: Erline Levine, MD;  Location: [place] [facility];  Service: Neurosurgery;  Laterality: N/A;  . FIBULA FRACTURE SURGERY Left   . MASTECTOMY Left   . RADIOLOGY WITH ANESTHESIA N/A 07/23/2016   Procedure: MRI OF BRAIN WITH AND WITHOUT;  Surgeon: Medication Radiologist, MD;  Location: [place];  Service: Radiology;  Laterality: N/A;  . RADIOLOGY WITH ANESTHESIA N/A 09/08/2016   Procedure: MRI OF BRAIN WITH AND WITHOUT CONTRAST;  Surgeon: Medication Radiologist, MD;  Location: [place];  Service: Radiology;  Laterality: N/A;  . RADIOLOGY WITH ANESTHESIA N/A 12/10/2016   Procedure: MRI OF BRAIN WITH AND WITHOUT;  Surgeon: Medication Radiologist, MD;  Location: [place];  Service: Radiology;  Laterality: N/A;  . right power port placement Right     Family History:  Family History  Problem Relation Age of Onset  . Diabetes Mother   . Bipolar disorder Mother   . CAD Father     Social History:  reports that she has been smoking Cigarettes.  She has been smoking about 0.25 packs per day. She has never used smokeless tobacco. She reports that she does not drink alcohol or use drugs.  Additional Social History:  Alcohol / Drug Use Pain Medications: please  see mar Prescriptions: please see mar Over the Counter: please see mar History of alcohol / drug use?: No history of alcohol / drug abuse Longest period of sobriety (when/how long): NA  CIWA: CIWA-Ar BP: 117/72 Pulse Rate: 89 COWS:    PATIENT STRENGTHS: (choose at least two) Average or above average intelligence Communication skills  Allergies:  Allergies  Allergen Reactions  . Demerol Itching and Nausea And Vomiting  . Erythromycin Rash    Home Medications:  (Not in a hospital admission)  OB/GYN Status:  No LMP recorded. Patient has had an ablation.  General Assessment Data Location of Assessment: [facility] ED TTS Assessment:  In system Is this a Tele or Face-to-Face Assessment?: Tele Assessment Is this an Initial Assessment or a Re-assessment for this encounter?: Initial Assessment Marital status: Divorced Mitchell name: NA Is patient pregnant?: No Pregnancy Status: No Living Arrangements: Non-relatives/Friends Can pt return to current living arrangement?: Yes Admission Status: Voluntary Is patient capable of signing voluntary admission?: Yes Referral Source: Self/Family/Friend Insurance type: Medicaid     Crisis Care Plan Living Arrangements: Non-relatives/Friends Legal Guardian: Other: (self) Name of Psychiatrist: NA Name of Therapist: NA  Education Status Is patient currently in school?: No Current Grade: NA Highest grade of school patient has completed: 12 Name of school: NA Contact person: NA  Risk to self with the past 6 months Suicidal Ideation: No Has patient been a risk to self within the past 6 months prior to admission? : No Suicidal Intent: No Has patient had any suicidal intent within the past 6 months prior to admission? : No Is patient at risk for suicide?: No Suicidal Plan?: No Has patient had any suicidal plan within the past 6 months prior to admission? : No Access to Means: No What has been your use of drugs/alcohol within the last 12 months?: NA Previous Attempts/Gestures: No How many times?: 0 Other Self Harm Risks: NA Triggers for Past Attempts: None known Intentional Self Injurious Behavior: None Family Suicide History: No Recent stressful life event(s): Other (Comment) (physical health) Persecutory voices/beliefs?: No Depression: Yes Depression Symptoms: Isolating, Loss of interest in usual pleasures, Feeling angry/irritable Substance abuse history and/or treatment for substance abuse?: Yes Suicide prevention information given to non-admitted patients: Not applicable  Risk to Others within the past 6 months Homicidal Ideation: No Does patient have any lifetime  risk of violence toward others beyond the six months prior to admission? : No Thoughts of Harm to Others: No Current Homicidal Intent: No Current Homicidal Plan: No Access to Homicidal Means: No Identified Victim: NA History of harm to others?: No Assessment of Violence: None Noted Violent Behavior Description: NA Does patient have access to weapons?: No Criminal Charges Pending?: No Does patient have a court date: No Is patient on probation?: No  Psychosis Hallucinations: None noted Delusions: None noted  Mental Status Report Appearance/Hygiene: In hospital gown Eye Contact: Fair Motor Activity: Freedom of movement Speech: Tangential Level of Consciousness: Restless, Irritable, Alert Mood: Anxious Affect: Anxious Anxiety Level: Moderate Thought Processes: Tangential Judgement: Impaired Orientation: Place Obsessive Compulsive Thoughts/Behaviors: None  Cognitive Functioning Concentration: Normal Memory: Recent Intact, Remote Intact IQ: Average Insight: Poor Impulse Control: Poor Appetite: Fair Weight Loss: 0 Weight Gain: 0 Sleep: No Change Total Hours of Sleep: 8 Vegetative Symptoms: None  ADLScreening [facility] Assessment Services) Patient's cognitive ability adequate to safely complete daily activities?: Yes Patient able to express need for assistance with ADLs?: Yes Independently performs ADLs?: Yes (appropriate for developmental age)  Prior Inpatient Therapy  Prior Inpatient Therapy: Yes Prior Therapy Dates: 2017 Prior Therapy Facilty/Provider(s): [facility] Reason for Treatment: MH issues  Prior Outpatient Therapy Prior Outpatient Therapy: Yes Prior Therapy Dates: 2017 Prior Therapy Facilty/Provider(s): [facility] Reason for Treatment: MH issues Does patient have an ACCT team?: No Does patient have Intensive In-House Services?  : No Does patient have [facility] services? : No Does patient have P4CC services?: No  ADL Screening (condition at time of  admission) Patient's cognitive ability adequate to safely complete daily activities?: Yes Is the patient deaf or have difficulty hearing?: No Does the patient have difficulty seeing, even when wearing glasses/contacts?: No Does the patient have difficulty concentrating, remembering, or making decisions?: No Patient able to express need for assistance with ADLs?: Yes Does the patient have difficulty dressing or bathing?: No Independently performs ADLs?: Yes (appropriate for developmental age) Does the patient have difficulty walking or climbing stairs?: No Weakness of Legs: None Weakness of Arms/Hands: None       Abuse/Neglect Assessment (Assessment to be complete while patient is alone) Physical Abuse: Denies Verbal Abuse: Denies Sexual Abuse: Denies Exploitation of patient/patient's resources: Denies Self-Neglect: Denies     Regulatory affairs officer (For Healthcare) Does Patient Have a Medical Advance Directive?: No    Additional Information 1:1 In Past 12 Months?: No CIRT Risk: No Elopement Risk: No Does patient have medical clearance?: Yes     Disposition:  Disposition Initial Assessment Completed for this Encounter: Yes Disposition of Patient: Inpatient treatment program Type of inpatient treatment program: Adult Other disposition(s): Other (Comment)  Keriann Rankin D 02/25/2017 6:29 PM

## 2017-02-25 NOTE — ED Provider Notes (Signed)
[facility] DEPT Provider Note   CSN: [id number] Arrival date & time: 02/25/17  1119     History   Chief Complaint Chief Complaint  Patient presents with  . Altered Mental Status    HPI [name] is a 56 y.o. female.  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   EMS was reportedly called by the patient's roommate because "she hasn't been acting right" for the last few days. Patient is very tearful, telling me that she doesn't know why she is here but is concerned that she has a tremor to both hands. On review of the medical record, it appears that this tremor has been present in the past. She tells me that she is not confused and "I just want to go home now." When asked if she is depressed, she becomes very tearful, stating that she is very depressed. Mood is labile, with the patient laughing and then crying. She denies visual or auditory hallucinations. Denies suicidal or homicidal thoughts. She denies nausea, vomiting, dysuria, frequency, abdominal pain, chest pain, or shortness of breath. Denies headache or vision changes. States that she is taking her medications as prescribed.  On review of the medical record, patient has had intermittent altered mental status and confusion since her brain surgery. She had an MRI of her brain last month that was negative for active brain disease. Per documentation by her oncologist, they were concerned that her symptoms are likely psychiatric in nature. Patient was evaluated by psychiatry here on 3/22, where healthy coping skills were discussed and patient was scheduled outpatient follow-up with a psychiatrist.  Past Medical History:  Diagnosis Date  . Alcohol abuse   . Anemia    during chemo  . Anxiety    At age 47    . Arthritis Dx 2010  . Bipolar disorder ([place])   . Cancer ([place])    breast mets to brain  . Chronic pain   . Complication of anesthesia   . Depression   . Fibromyalgia Dx 2005  . GERD (gastroesophageal reflux disease)   . Headache    hx  migraines  . Opiate dependence ([place])   . PONV (postoperative nausea and vomiting)   . Port-a-cath in place   . PTSD (post-traumatic stress disorder)     Patient Active Problem List   Diagnosis Date Noted  . Hypokalemia 02/18/2017  . Gait abnormality 01/06/2017  . Bipolar I disorder, most recent episode depressed ([place]) 12/08/2016  . Adjustment disorder with anxiety 12/06/2016  . Delirium due to another medical condition 12/03/2016  . Acute encephalopathy 12/03/2016  . Diarrhea 12/03/2016  . Metastatic breast cancer ([place])   . Cerumen impaction 11/25/2016  . Otitis media 11/06/2016  . Brain metastasis ([place]) 07/27/2016  . Iron deficiency anemia 06/26/2016  . Bone metastases ([place]) 06/03/2016  . Primary cancer of lower-inner quadrant of left female breast ([place]) 06/01/2016  . Pap smear for cervical cancer screening 03/28/2015  . Current smoker 03/28/2015  . Healthcare maintenance 03/28/2015  . Seasonal allergies 03/28/2015  . Anxiety state 02/28/2015  . Fibromyalgia 02/28/2015  . Family history of diabetes mellitus 02/28/2015  . H/O alcohol abuse     Past Surgical History:  Procedure Laterality Date  . APPLICATION OF CRANIAL NAVIGATION N/A 08/14/2016   Procedure: APPLICATION  OF CRANIAL NAVIGATION;  Surgeon: Erline Levine, MD;  Location: [place] [facility];  Service: Neurosurgery;  Laterality: N/A;  . BREAST RECONSTRUCTION Left    with silicone implant  . CRANIOTOMY N/A 08/14/2016   Procedure: CRANIOTOMY TUMOR EXCISION WITH Lucky Rathke;  Surgeon: Erline Levine, MD;  Location: [place] [facility];  Service: Neurosurgery;  Laterality: N/A;  . FIBULA FRACTURE SURGERY Left   . MASTECTOMY Left   . RADIOLOGY WITH ANESTHESIA N/A 07/23/2016   Procedure: MRI OF BRAIN  WITH AND WITHOUT;  Surgeon: Medication Radiologist, MD;  Location: [place];  Service: Radiology;  Laterality: N/A;  . RADIOLOGY WITH ANESTHESIA N/A 09/08/2016   Procedure: MRI OF BRAIN WITH AND WITHOUT CONTRAST;  Surgeon: Medication Radiologist, MD;  Location: [place];  Service: Radiology;  Laterality: N/A;  . RADIOLOGY WITH ANESTHESIA N/A 12/10/2016   Procedure: MRI OF BRAIN WITH AND WITHOUT;  Surgeon: Medication Radiologist, MD;  Location: [place];  Service: Radiology;  Laterality: N/A;  . right power port placement Right     OB History    No data available       Home Medications    Prior to Admission medications   Medication Sig Start Date End Date Taking? Authorizing Provider  anastrozole (ARIMIDEX) 1 MG tablet Take 1 tablet (1 mg total) by mouth daily. 08/25/16  Yes Chauncey Cruel, MD  Biotin 1 MG CAPS Take 1 mg by mouth daily.   Yes Historical Provider, MD  cetirizine (ZYRTEC) 10 MG tablet Take 1 tablet (10 mg total) by mouth daily. 07/14/16  Yes Chauncey Cruel, MD  diphenhydrAMINE (BENADRYL) 25 MG tablet Take 25 mg by mouth every 6 (six) hours as needed for allergies.   Yes Historical Provider, MD  gabapentin (NEURONTIN) 300 MG capsule Take 600 mg by mouth 3 (three) times daily.   Yes Historical Provider, MD  lamoTRIgine (LAMICTAL) 100 MG tablet Take 1.5 tablets (150 mg total) by mouth 2 (two) times daily. Patient taking differently: Take 100 mg by mouth daily.  11/11/16  Yes Chauncey Cruel, MD  lithium carbonate 300 MG capsule Take 300 mg by mouth 2 (two) times daily with a meal.   Yes Historical Provider, MD  pantoprazole (PROTONIX) 40 MG tablet Take 40 mg by mouth daily.   Yes Historical Provider, MD  potassium chloride (MICRO-K) 10 MEQ CR capsule Take 1 capsule (10 mEq total) by mouth daily. 02/18/17  Yes Arnoldo Morale, MD  traZODone (DESYREL) 100 MG tablet Take 200 mg by mouth at bedtime.    Yes Historical Provider, MD  ibuprofen (ADVIL,MOTRIN) 200 MG tablet Take 800 mg by  mouth every 6 (six) hours as needed for headache, mild pain or moderate pain.     Historical Provider, MD  lidocaine-prilocaine (EMLA) cream Apply 1 application topically as needed (prior to accessing port).    Historical Provider, MD    Family History Family History  Problem Relation Age of Onset  . Diabetes Mother   . Bipolar disorder Mother   . CAD Father     Social History Social History  Substance Use Topics  . Smoking status: Current Every Day Smoker    Packs/day: 0.25    Types: Cigarettes  . Smokeless tobacco: Never Used     Comment: Pt is on Chantix at present time  . Alcohol use No     Comment: no ETOH since 08/22/12     Allergies   Demerol and Erythromycin   Review of Systems Review of Systems  Constitutional:  Negative for chills and fever.  HENT: Negative for congestion, rhinorrhea and sore throat.   Eyes: Negative for pain and visual disturbance.  Respiratory: Negative for cough and shortness of breath.   Cardiovascular: Negative for chest pain.  Gastrointestinal: Negative for abdominal pain, diarrhea, nausea and vomiting.  Genitourinary: Negative for dysuria and frequency.  Musculoskeletal: Negative for arthralgias, back pain and neck pain.  Skin: Negative for rash.  Neurological: Negative for dizziness, syncope, weakness, light-headedness and headaches.  Psychiatric/Behavioral: Positive for agitation (intermittently), confusion, decreased concentration and dysphoric mood. Negative for suicidal ideas. The patient is nervous/anxious.      Physical Exam Updated Vital Signs BP 115/82 (BP Location: Right Arm)   Pulse 94   Temp 97.8 F (36.6 C) (Oral)   Resp 18   SpO2 100%   Physical Exam  Constitutional: She appears well-developed and well-nourished. No distress.  HENT:  Head: Normocephalic and atraumatic.  Eyes: Conjunctivae and EOM are normal. Pupils are equal, round, and reactive to light.  Neck: Normal range of motion. Neck supple.  No midline  TTP over the cervical spine  Cardiovascular: Normal rate, regular rhythm, normal heart sounds and intact distal pulses.   No murmur heard. Pulmonary/Chest: Effort normal and breath sounds normal. No respiratory distress. She has no wheezes. She has no rales.  Abdominal: Soft. Bowel sounds are normal. She exhibits no distension. There is no tenderness.  Musculoskeletal: Normal range of motion. She exhibits no edema or deformity.  Neurological: She is alert.  Oriented to person only. Normal gait. Normal speech. Confused. Poor short term memory.   Skin: Skin is warm and dry.  Psychiatric:  Labile mood. Endorses depression. Denies SI/HI/AVH  Nursing note and vitals reviewed.    ED Treatments / Results  Labs (all labs ordered are listed, but only abnormal results are displayed) Labs Reviewed  COMPREHENSIVE METABOLIC PANEL - Abnormal; Notable for the following:       Result Value   Glucose, Bld 109 (*)    BUN <5 (*)    All other components within normal limits  URINALYSIS, ROUTINE W REFLEX MICROSCOPIC - Abnormal; Notable for the following:    Color, Urine STRAW (*)    Ketones, ur 5 (*)    Leukocytes, UA SMALL (*)    All other components within normal limits  URINE CULTURE  CBC  RAPID URINE DRUG SCREEN, HOSP PERFORMED  LITHIUM LEVEL  CBG MONITORING, ED  I-STAT CG4 LACTIC ACID, ED    EKG  EKG Interpretation None       Radiology Dg Chest 2 View  Result Date: 02/25/2017 CLINICAL DATA:  Altered mental status. 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 There is no focal infiltrate. There is no pleural effusion. No pulmonary parenchymal nodules or masses are observed. The heart and pulmonary vascularity are normal. The power port catheter tip projects over the midportion of the SVC. The bony thorax exhibits no acute abnormality. IMPRESSION: There is no pneumonia, CHF, nor  other acute cardiopulmonary abnormality. Electronically Signed   By: David  Martinique M.D.   On: 02/25/2017 16:17   Ct Head Wo Contrast  Result Date: 02/25/2017 CLINICAL DATA:  Altered mental status EXAM: CT HEAD WITHOUT CONTRAST TECHNIQUE: Contiguous axial images were obtained from the base of the skull through the vertex without intravenous contrast. COMPARISON:  12/03/2016.  MRI 12/10/2016. FINDINGS: Brain: Postoperative changes in the left occipital region.  No acute intracranial abnormality. Specifically, no hemorrhage, hydrocephalus, mass lesion, acute infarction, or significant intracranial injury. Vascular: No hyperdense vessel or unexpected calcification. Skull: No acute calvarial abnormality. Prior craniectomy and mesh placement in the left occipital region. Sinuses/Orbits: Visualized paranasal sinuses and mastoids clear. Orbital soft tissues unremarkable. Other: None IMPRESSION: Postoperative changes in the left occipital region. No acute intracranial abnormality. Electronically Signed   By: Rolm Baptise M.D.   On: 02/25/2017 16:06    Procedures Procedures (including critical care time)  Medications Ordered in ED Medications  potassium chloride SA (K-DUR,KLOR-CON) CR tablet 10 mEq (not administered)  gabapentin (NEURONTIN) capsule 600 mg (600 mg Oral Given 02/25/17 2305)  lithium carbonate capsule 300 mg (not administered)  pantoprazole (PROTONIX) EC tablet 40 mg (not administered)  traZODone (DESYREL) tablet 200 mg (200 mg Oral Given 02/25/17 2305)  lamoTRIgine (LAMICTAL) tablet 100 mg (not administered)  anastrozole (ARIMIDEX) tablet 1 mg (not administered)  loratadine (CLARITIN) tablet 10 mg (not administered)  enoxaparin (LOVENOX) injection 40 mg (40 mg Subcutaneous Given 02/25/17 2306)  sodium chloride flush (NS) 0.9 % injection 3 mL (3 mLs Intravenous Given 02/25/17 2306)  sodium chloride flush (NS) 0.9 % injection 3 mL (not administered)  0.9 %  sodium chloride infusion (not  administered)  ondansetron (ZOFRAN) tablet 4 mg (not administered)    Or  ondansetron (ZOFRAN) injection 4 mg (not administered)  LORazepam (ATIVAN) tablet 0.5 mg (0.5 mg Oral Given 02/25/17 1744)     Initial Impression / Assessment and Plan / ED Course  I have reviewed the triage vital signs and the nursing notes.  Pertinent labs & imaging results that were available during my care of the patient were reviewed by me and considered in my medical decision making (see chart for details).     On arrival the patient is afebrile and hemodynamically stable. She exhibits a labile mood, and is oriented to person only. States she does not remember the year and doesn't know where she is. She is confused and unable to answer questions about her medical history. Speech is clear and she has a normal gait. Doubt stroke or TIA. Per the medical record, she seems to have waxing and waning mental status and I'm concerned for delirium of so far unknown etiology.  CT head obtained with postoperative changes in the left occipital region but no acute intracranial abnormalities. Chest x-ray with no focal consolidations to suggest pneumonia. CBC with no leukocytosis. Patient does not have meningismus on exam and I have a low suspicion for infectious encephalitis or meningitis. CMP with normal electrolytes. No lactic acidosis and UA not indicative of UTI. I doubt infection as the underlying cause of the patient's encephalopathy. UDS negative.  Given labile mood, TTS was consulted, and do not believe that the patient's symptoms are secondary to underlying psychiatric illness. She was cleared from a psychiatric perspective.  On reassessment patient remains confused. I am concerned for her ability to safely care for herself at home. Will admit to medicine for observation. The underlying cause of her encephalopathy remains unknown.  Care of patient overseen by my attending, Dr. Sherry Ruffing.   Final Clinical Impressions(s) /  ED Diagnoses   Final diagnoses:  None    New Prescriptions Current Discharge Medication List       Zipporah Plants, MD 02/26/17 0123    Gwenyth Allegra Tegeler, MD 02/28/17 1011

## 2017-02-25 NOTE — H&P (Signed)
History and Physical    [name] NUU:[id number] DOB: [birth year] DOA: 02/25/2017  PCP: Arnoldo Morale, MD   Patient coming from: Home  Chief Complaint: Confusion   HPI: [name] is a 56 y.o. female with medical history significant for remote alcohol abuse, bipolar disorder, cancer of the left breast with brain metastases status post left mastectomy and axillary node dissection with craniectomy and radiation to the brain and breast, now presenting to the emergency department with several days of confusion. Since the patient's craniectomy in September 2017, she has had multiple ED visits and an admission to the hospital for confusion. This has seemed to wax and wane, and it is not clear if she completely normalizes between the episodes. She is now brought in after her roommate raises concern for several days of confusion, but is not available at time of admission to assist with additional history. Patient has apparently been intermittently confused, but has not voiced any specific complaints. Patient denies any pain, dyspnea, nausea, vomiting, or diarrhea. She acknowledges her confusion and reports that it began after her brain surgery in September 2017. She also reports an unsteady gait with occasional falls since her brain surgery, but denies any significant injury related to these.  ED Course: Upon arrival to the ED, patient is found to be afebrile, saturating well on room air, and with vital signs stable. Noncontrast head CT is negative for acute intracranial abnormality and chest x-ray is negative for acute cardiopulmonary disease. Chemistry panel is unremarkable and CBC is also entirely within normal limits. Lactic acid is reassuring at 0.76, UDS is completely negative, and urinalysis is unremarkable. Patient remained hemodynamically stable in the ED and in no apparent respiratory distress. She has been intermittently confused. Psychiatry was consulted by the ED physician and feels that the  current presentation is not psychiatric. Patient will be observed on the medical surgical unit for ongoing evaluation and management of acute encephalopathy.   Review of Systems:  All other systems reviewed and apart from HPI, are negative.  Past Medical History:  Diagnosis Date  . Alcohol abuse   . Anemia    during chemo  . Anxiety    At age 31  . Arthritis Dx 2010  . Bipolar disorder ([place])   . Cancer ([place])    breast mets to brain  . Chronic pain   . Complication of anesthesia   . Depression   . Fibromyalgia Dx 2005  . GERD (gastroesophageal reflux disease)   . Headache    hx  migraines  . Opiate dependence ([place])   . PONV (postoperative nausea and vomiting)   . Port-a-cath in place   . PTSD (post-traumatic stress disorder)     Past Surgical History:  Procedure Laterality Date  . APPLICATION OF CRANIAL NAVIGATION N/A 08/14/2016   Procedure: APPLICATION OF CRANIAL NAVIGATION;  Surgeon: Erline Levine, MD;  Location: [facility];  Service: Neurosurgery;  Laterality: N/A;  . BREAST RECONSTRUCTION Left    with silicone implant  . CRANIOTOMY N/A 08/14/2016   Procedure: CRANIOTOMY TUMOR EXCISION WITH Lucky Rathke;  Surgeon: Erline Levine, MD;  Location: [facility];  Service: Neurosurgery;  Laterality: N/A;  . FIBULA FRACTURE SURGERY Left   . MASTECTOMY Left   . RADIOLOGY WITH ANESTHESIA N/A 07/23/2016   Procedure: MRI OF BRAIN WITH AND WITHOUT;  Surgeon: Medication Radiologist, MD;  Location: [facility];  Service: Radiology;  Laterality: N/A;  . RADIOLOGY WITH ANESTHESIA N/A 09/08/2016   Procedure: MRI OF BRAIN WITH AND  WITHOUT CONTRAST;  Surgeon: Medication Radiologist, MD;  Location: [facility];  Service: Radiology;  Laterality: N/A;  . RADIOLOGY WITH ANESTHESIA N/A 12/10/2016   Procedure: MRI OF BRAIN WITH AND WITHOUT;  Surgeon: Medication Radiologist, MD;  Location: [facility];  Service: Radiology;  Laterality: N/A;  . right power port placement Right      reports that she has been smoking  Cigarettes.  She has been smoking about 0.25 packs per day. She has never used smokeless tobacco. She reports that she does not drink alcohol or use drugs.  Allergies  Allergen Reactions  . Demerol Itching and Nausea And Vomiting  . Erythromycin Rash    Family History  Problem Relation Age of Onset  . Diabetes Mother   . Bipolar disorder Mother   . CAD Father      Prior to Admission medications   Medication Sig Start Date End Date Taking? Authorizing Provider  anastrozole (ARIMIDEX) 1 MG tablet Take 1 tablet (1 mg total) by mouth daily. 08/25/16  Yes Chauncey Cruel, MD  Biotin 1 MG CAPS Take 1 mg by mouth daily.   Yes Historical Provider, MD  cetirizine (ZYRTEC) 10 MG tablet Take 1 tablet (10 mg total) by mouth daily. 07/14/16  Yes Chauncey Cruel, MD  diphenhydrAMINE (BENADRYL) 25 MG tablet Take 25 mg by mouth every 6 (six) hours as needed for allergies.   Yes Historical Provider, MD  gabapentin (NEURONTIN) 300 MG capsule Take 600 mg by mouth 3 (three) times daily.   Yes Historical Provider, MD  lamoTRIgine (LAMICTAL) 100 MG tablet Take 1.5 tablets (150 mg total) by mouth 2 (two) times daily. Patient taking differently: Take 100 mg by mouth daily.  11/11/16  Yes Chauncey Cruel, MD  lithium carbonate 300 MG capsule Take 300 mg by mouth 2 (two) times daily with a meal.   Yes Historical Provider, MD  pantoprazole (PROTONIX) 40 MG tablet Take 40 mg by mouth daily.   Yes Historical Provider, MD  potassium chloride (MICRO-K) 10 MEQ CR capsule Take 1 capsule (10 mEq total) by mouth daily. 02/18/17  Yes Arnoldo Morale, MD  traZODone (DESYREL) 100 MG tablet Take 200 mg by mouth at bedtime.    Yes Historical Provider, MD  ibuprofen (ADVIL,MOTRIN) 200 MG tablet Take 800 mg by mouth every 6 (six) hours as needed for headache, mild pain or moderate pain.     Historical Provider, MD  lidocaine-prilocaine (EMLA) cream Apply 1 application topically as needed (prior to accessing port).    Historical  Provider, MD    Physical Exam: Vitals:   02/25/17 1437 02/25/17 1746 02/25/17 1747 02/25/17 1750  BP: 126/71 117/72  117/72  Pulse: 100  87 89  Resp: 17 15 15 14   Temp:      TempSrc:      SpO2: 99%  98% 100%      Constitutional: No respiratory distress, appears anxious and chronically-ill Eyes: PERTLA, lids and conjunctivae normal ENMT: Mucous membranes are moist. Posterior pharynx clear of any exudate or lesions.   Neck: normal, supple, no masses, no thyromegaly Respiratory: clear to auscultation bilaterally, no wheezing, no crackles. Normal respiratory effort.  Cardiovascular: S1 & S2 heard, regular rate and rhythm. No extremity edema. No significant JVD. Abdomen: No distension, no tenderness, no masses palpated. Bowel sounds normal.  Musculoskeletal: no clubbing / cyanosis. No joint deformity upper and lower extremities.   Skin: no significant rashes, lesions, ulcers. Warm, dry, well-perfused. Neurologic: CN 2-12 grossly intact. Sensation intact,  DTR normal. Strength 5/5 in all 4 limbs.  Psychiatric: Alert and oriented x 3. Anxious, intermittent confusion.     Labs on Admission: I have personally reviewed following labs and imaging studies  CBC:  Recent Labs Lab 02/25/17 1134  WBC 4.7  HGB 12.5  HCT 39.3  MCV 97.3  PLT 983   Basic Metabolic Panel:  Recent Labs Lab 02/25/17 1134  NA 140  K 3.8  CL 105  CO2 28  GLUCOSE 109*  BUN <5*  CREATININE 0.81  CALCIUM 10.2   GFR: Estimated Creatinine Clearance: 70.6 mL/min (by C-G formula based on SCr of 0.81 mg/dL). Liver Function Tests:  Recent Labs Lab 02/25/17 1134  AST 22  ALT 15  ALKPHOS 39  BILITOT 0.6  PROT 6.7  ALBUMIN 4.3   No results for input(s): LIPASE, AMYLASE in the last 168 hours. No results for input(s): AMMONIA in the last 168 hours. Coagulation Profile: No results for input(s): INR, PROTIME in the last 168 hours. Cardiac Enzymes: No results for input(s): CKTOTAL, CKMB, CKMBINDEX,  TROPONINI in the last 168 hours. BNP (last 3 results) No results for input(s): PROBNP in the last 8760 hours. HbA1C: No results for input(s): HGBA1C in the last 72 hours. CBG:  Recent Labs Lab 02/25/17 1754  GLUCAP 85   Lipid Profile: No results for input(s): CHOL, HDL, LDLCALC, TRIG, CHOLHDL, LDLDIRECT in the last 72 hours. Thyroid Function Tests: No results for input(s): TSH, T4TOTAL, FREET4, T3FREE, THYROIDAB in the last 72 hours. Anemia Panel: No results for input(s): VITAMINB12, FOLATE, FERRITIN, TIBC, IRON, RETICCTPCT in the last 72 hours. Urine analysis:    Component Value Date/Time   COLORURINE STRAW (A) 02/25/2017 1806   APPEARANCEUR CLEAR 02/25/2017 1806   LABSPEC 1.006 02/25/2017 1806   PHURINE 8.0 02/25/2017 1806   GLUCOSEU NEGATIVE 02/25/2017 1806   HGBUR NEGATIVE 02/25/2017 1806   BILIRUBINUR NEGATIVE 02/25/2017 1806   BILIRUBINUR negative 03/28/2015 1321   KETONESUR 5 (A) 02/25/2017 1806   PROTEINUR NEGATIVE 02/25/2017 1806   UROBILINOGEN 0.2 03/28/2015 1321   UROBILINOGEN 0.2 04/18/2013 1222   NITRITE NEGATIVE 02/25/2017 1806   LEUKOCYTESUR SMALL (A) 02/25/2017 1806   Sepsis Labs: @LABRCNTIP (procalcitonin:4,lacticidven:4) )No results found for this or any previous visit (from the past 240 hour(s)).   Radiological Exams on Admission: Dg Chest 2 View  Result Date: 02/25/2017 CLINICAL DATA:  Altered mental status. 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 There is no focal infiltrate. There is no pleural effusion. No pulmonary parenchymal nodules or masses are observed. The heart and pulmonary vascularity are normal. The power port catheter tip projects over the midportion of the SVC. The bony thorax exhibits no acute abnormality. IMPRESSION: There is no pneumonia, CHF, nor other acute cardiopulmonary abnormality. Electronically Signed   By: David   Martinique M.D.   On: 02/25/2017 16:17   Ct Head Wo Contrast  Result Date: 02/25/2017 CLINICAL DATA:  Altered mental status EXAM: CT HEAD WITHOUT CONTRAST TECHNIQUE: Contiguous axial images were obtained from the base of the skull through the vertex without intravenous contrast. COMPARISON:  12/03/2016.  MRI 12/10/2016. FINDINGS: Brain: Postoperative changes in the left occipital region. No acute intracranial abnormality. Specifically, no hemorrhage, hydrocephalus, mass lesion, acute infarction, or significant intracranial injury. Vascular: No hyperdense vessel or unexpected calcification. Skull: No acute calvarial abnormality. Prior craniectomy and mesh placement in the left occipital region. Sinuses/Orbits: Visualized paranasal  sinuses and mastoids clear. Orbital soft tissues unremarkable. Other: None IMPRESSION: Postoperative changes in the left occipital region. No acute intracranial abnormality. Electronically Signed   By: Rolm Baptise M.D.   On: 02/25/2017 16:06    EKG: Not performed, will obtain as appropriate.   Assessment/Plan  1. Acute encephalopathy  - Pt was sent to ED by roommate who had become concerned with waxing and waning confusion that seemed to be worsening overall   - Pt is intermittently confused and disoriented in ED, has no specific complaints  - Head CT is stable and labs are remarkably normal  - ED physician consulted with psychiatry who did not feel that this is psychiatric  - She has had multiple presentations to ED under similar circumstances and was admitted in January 2018 with negative RPR, TSH, B12, and folate; she also had MRI that admission that appeared stable  - Pt reports that these troubles began following the craniectomy in September 2017 and the brain mets/subsequent surgery seems to be the most likely etiology  - She will likely need placement unfortunately, but could not be arranged tonight - Plan to observe with lithium level, supportive care, PT eval, SW  consultation   2. Breast cancer, metastatic - Pt is s/p left mastectomy, axillary node dissection, craniectomy, and radiation to brain and breast  - She continues to follow with oncology  - Continue Arimidex   3. Bipolar disorder  - Pt denies SI, HI, or hallucinations  - Managed with lithium, plan to check level and continue    DVT prophylaxis: sq Lovenox  Code Status: Full  Family Communication: Discussed with patient Disposition Plan: Observe on med-surg Consults called: None Admission status: Observation    Vianne Bulls, MD [facility] Pager [phone]  If 7PM-7AM, please contact night-coverage [email or web address] Password [facility]  02/25/2017, 9:54 PM

## 2017-02-25 NOTE — ED Notes (Signed)
Sitter in room with patient. 

## 2017-02-25 NOTE — ED Notes (Signed)
Pt transported to xray 

## 2017-02-26 DIAGNOSIS — Z8249 Family history of ischemic heart disease and other diseases of the circulatory system: Secondary | ICD-10-CM | POA: Diagnosis not present

## 2017-02-26 DIAGNOSIS — N390 Urinary tract infection, site not specified: Secondary | ICD-10-CM | POA: Diagnosis present

## 2017-02-26 DIAGNOSIS — F40240 Claustrophobia: Secondary | ICD-10-CM | POA: Diagnosis present

## 2017-02-26 DIAGNOSIS — I349 Nonrheumatic mitral valve disorder, unspecified: Secondary | ICD-10-CM | POA: Diagnosis not present

## 2017-02-26 DIAGNOSIS — C50312 Malignant neoplasm of lower-inner quadrant of left female breast: Secondary | ICD-10-CM | POA: Diagnosis not present

## 2017-02-26 DIAGNOSIS — Z853 Personal history of malignant neoplasm of breast: Secondary | ICD-10-CM | POA: Diagnosis not present

## 2017-02-26 DIAGNOSIS — G9340 Encephalopathy, unspecified: Secondary | ICD-10-CM | POA: Diagnosis present

## 2017-02-26 DIAGNOSIS — Z01810 Encounter for preprocedural cardiovascular examination: Secondary | ICD-10-CM | POA: Diagnosis not present

## 2017-02-26 DIAGNOSIS — Z79811 Long term (current) use of aromatase inhibitors: Secondary | ICD-10-CM | POA: Diagnosis not present

## 2017-02-26 DIAGNOSIS — Z9221 Personal history of antineoplastic chemotherapy: Secondary | ICD-10-CM | POA: Diagnosis not present

## 2017-02-26 DIAGNOSIS — F3130 Bipolar disorder, current episode depressed, mild or moderate severity, unspecified: Secondary | ICD-10-CM | POA: Diagnosis not present

## 2017-02-26 DIAGNOSIS — Z833 Family history of diabetes mellitus: Secondary | ICD-10-CM | POA: Diagnosis not present

## 2017-02-26 DIAGNOSIS — F17210 Nicotine dependence, cigarettes, uncomplicated: Secondary | ICD-10-CM | POA: Diagnosis present

## 2017-02-26 DIAGNOSIS — C7931 Secondary malignant neoplasm of brain: Secondary | ICD-10-CM | POA: Diagnosis present

## 2017-02-26 DIAGNOSIS — K219 Gastro-esophageal reflux disease without esophagitis: Secondary | ICD-10-CM | POA: Diagnosis present

## 2017-02-26 DIAGNOSIS — Z79899 Other long term (current) drug therapy: Secondary | ICD-10-CM | POA: Diagnosis not present

## 2017-02-26 DIAGNOSIS — Z818 Family history of other mental and behavioral disorders: Secondary | ICD-10-CM | POA: Diagnosis not present

## 2017-02-26 DIAGNOSIS — Z9114 Patient's other noncompliance with medication regimen: Secondary | ICD-10-CM | POA: Diagnosis not present

## 2017-02-26 DIAGNOSIS — F319 Bipolar disorder, unspecified: Secondary | ICD-10-CM | POA: Diagnosis present

## 2017-02-26 DIAGNOSIS — C7951 Secondary malignant neoplasm of bone: Secondary | ICD-10-CM | POA: Diagnosis present

## 2017-02-26 DIAGNOSIS — F05 Delirium due to known physiological condition: Secondary | ICD-10-CM | POA: Diagnosis present

## 2017-02-26 DIAGNOSIS — Z9012 Acquired absence of left breast and nipple: Secondary | ICD-10-CM | POA: Diagnosis not present

## 2017-02-26 DIAGNOSIS — R410 Disorientation, unspecified: Secondary | ICD-10-CM | POA: Diagnosis not present

## 2017-02-26 DIAGNOSIS — I959 Hypotension, unspecified: Secondary | ICD-10-CM | POA: Diagnosis present

## 2017-02-26 DIAGNOSIS — Z170 Estrogen receptor positive status [ER+]: Secondary | ICD-10-CM | POA: Diagnosis not present

## 2017-02-26 DIAGNOSIS — Z923 Personal history of irradiation: Secondary | ICD-10-CM | POA: Diagnosis not present

## 2017-02-26 LAB — URINE CULTURE: Culture: NO GROWTH

## 2017-02-26 MED ORDER — DEXTROSE 5 % IV SOLN
1.0000 g | INTRAVENOUS | Status: DC
  Administered 2017-02-26: 1 g via INTRAVENOUS
  Filled 2017-02-26 (×2): qty 10

## 2017-02-26 MED ORDER — LORAZEPAM 2 MG/ML IJ SOLN
0.5000 mg | Freq: Four times a day (QID) | INTRAMUSCULAR | Status: DC | PRN
  Administered 2017-02-27 – 2017-03-02 (×4): 0.5 mg via INTRAMUSCULAR
  Filled 2017-02-26 (×5): qty 1

## 2017-02-26 MED ORDER — LORAZEPAM 1 MG PO TABS: 1.0000 mg | ORAL_TABLET | Freq: Four times a day (QID) | ORAL | Status: DC | PRN

## 2017-02-26 MED ORDER — LORAZEPAM 2 MG/ML IJ SOLN: 1.0000 mg | Freq: Four times a day (QID) | INTRAMUSCULAR | Status: DC | PRN

## 2017-02-26 MED ORDER — LORAZEPAM 0.5 MG PO TABS
0.5000 mg | ORAL_TABLET | Freq: Four times a day (QID) | ORAL | Status: DC | PRN
  Administered 2017-02-26 (×2): 0.5 mg via ORAL
  Filled 2017-02-26 (×2): qty 1

## 2017-02-26 MED ORDER — POTASSIUM CHLORIDE IN NACL 20-0.9 MEQ/L-% IV SOLN
INTRAVENOUS | Status: DC
  Administered 2017-02-26 – 2017-02-27 (×2): via INTRAVENOUS
  Filled 2017-02-26 (×2): qty 1000

## 2017-02-26 MED ORDER — LORAZEPAM 0.5 MG PO TABS
0.5000 mg | ORAL_TABLET | Freq: Four times a day (QID) | ORAL | Status: DC | PRN
Start: 2017-02-26 — End: 2017-03-03
  Administered 2017-02-26 – 2017-03-03 (×6): 0.5 mg via ORAL
  Filled 2017-02-26 (×6): qty 1

## 2017-02-26 MED ORDER — QUETIAPINE FUMARATE 25 MG PO TABS
50.0000 mg | ORAL_TABLET | Freq: Four times a day (QID) | ORAL | Status: DC | PRN
  Administered 2017-02-26 – 2017-02-27 (×3): 50 mg via ORAL
  Filled 2017-02-26 (×3): qty 2

## 2017-02-26 NOTE — Progress Notes (Signed)
PT Cancellation/Discharge Note  Patient Details Name: [name] MRN: [id number] DOB: October 12, 1961   Cancelled Treatment:    Reason Eval/Treat Not Completed: PT screened, no needs identified, will sign off.   Patient observed ambulating in hallway with nursing supervision.  Patient requiring no physical assist, ambulating entire nursing unit.  Observed patient bending down to pick up her bag of clothes and pocketbook from the floor with no loss of balance.  PT will sign off.  Please re-order PT if patient's status changes.  Thank you.   Despina Pole 02/26/2017, 5:06 PM Carita Pian. Sanjuana Kava, [place] Pager [phone]

## 2017-02-26 NOTE — Progress Notes (Signed)
CSW consulted for help with IVC paperwork  Paperwork completed and submitted to magistrate who is approving IVC- paperwork will be served this evening.  IVC to start on 3/30- will need to be renewed on 4/6 is pt is still inpatient and unsafe to leave- CSW will need to be consulted to rescind the IVC if patient is cleared for DC.  Jorge Ny, [place] Worker [phone]

## 2017-02-26 NOTE — Progress Notes (Signed)
[name], [name] a 56 y.o female arrived on the unit via bed from the emergency department.  Patient is alert and oriented to person only.  Patient is very anxious and non-compliant to the nurse request to remain seated due to unsteady gait and recent fall in the ED.  Patient has a Air cabin crew who is at the bedside. Skin assessment complete.  Old surgical scar on the posterior part of the head due to craniectomy performed in 20017. Patient has bruise on the left arm and tatoos all over the body.  No complaints of pain.  Vital signs stable.  Activated the bed arm and placed the call light within reach.  Will continue to monitor the patient

## 2017-02-26 NOTE — Progress Notes (Signed)
PROGRESS NOTE    [name]  YQM:[id number] DOB: 1961-09-14 DOA: 02/25/2017 PCP: Arnoldo Morale, MD   Brief Narrative: 56 y.o. female with medical history significant for remote alcohol abuse, bipolar disorder, cancer of the left breast with brain metastases status post left mastectomy and axillary node dissection with craniectomy and radiation to the brain and breast, now presenting to the emergency department with several days of confusion. Since the patient's craniectomy in September 2017, she has had multiple ED visits and an admission to the hospital for confusion. This has seemed to wax and wane, and it is not clear if she completely normalizes between the episodes. She is now brought in after her roommate raises concern for several days of confusion, but is not available at time of admission to assist with additional history. Patient has apparently been intermittently confused, but has not voiced any specific complaints. Patient denies any pain, dyspnea, nausea, vomiting, or diarrhea. She acknowledges her confusion and reports that it began after her brain surgery in September 2017. She also reports an unsteady gait with occasional falls since her brain surgery, but denies any significant injury related to these.  In ED: CT is negative for acute intracranial abnormality.Psychiatry was consulted by the ED physician and feels that the current presentation is not psychiatric.  Assessment & Plan:  #Acute encephalopathy of unknown etiology: -Reportedly patient has intermittent confusion since brain surgery. Head CT with no acute finding. Patient denied any symptoms and wanted to go home today. As per prior note, psychiatry was consulted from the ER. Patient currently has bed sitter for safety. Reportedly patient had multiple admissions for the confusion with no known diagnosis. Unknown if her confusion is related with brain metastases and post surgical effect. -UA showed UTI. Plan to treat with  ceftriaxone for 3 days. Follow up culture results. -We will obtain MRI of brain to further evaluate the patient. -Patient had workup including TSH, vitamin B12,  ammonia, RPR, HIV antibody unremarkable test result in January 2018. -I discussed with the patient's sister in detail. She reported this issue has been going on and was frustrated that there is no confirmed diagnosis. At this time, it does not look like patient understands the condition and consequences. I do not think she has capacity to make clinical decision. We will continue bed sitter. I consulted psychiatry for capacity evaluation. PT, OT and social worker evaluation. Patient may benefit from higher level of care after discharge for the safety. Patient's sister stated that patient needs to be sedated during MRI.  # Anxiety and history of bipolar disorder: Patient was very anxious this morning. resumed low-dose Ativan as needed for the anxiety. -Continue Lamictal, Neurontin, lithium, trazodone -Continue supportive care  #Metastatic breast cancer: Status post left mastectomy and brain surgery. Recommended to follow-up with oncologist outpatient. MRI brain ordered to further evaluate. Continue Arimidex.  #UTI, site unspecified: Follow up culture results. Started ceftriaxone.  I discussed with patient's sister at length. 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  Principal Problem:   Acute encephalopathy Active Problems:   H/O alcohol abuse   Metastatic breast cancer ([place])   Bipolar I disorder, most recent episode depressed ([place])  DVT prophylaxis: Lovenox subcutaneous Code Status: Full code Family Communication: Discussed with the patient's sister in detail Disposition Plan: Unknown at this time, likely rehabilitation in 1-2 days    Consultants:   Psychiatrist  Procedures: None Antimicrobials:  Ceftriaxone  Subjective: Patient was seen and examined at bedside. She was very anxious and wanted to go home. Sitter at bedside denied headache, dizziness, chest pain, shortness of breath.  Objective: Vitals:   02/25/17 2130 02/25/17 2237 02/26/17 0646 02/26/17 1338  BP: 120/78 115/82 105/75 113/81  Pulse: 82 94 86 (!) 104  Resp: 16 18 18 19   Temp: 98.4 F (36.9 C) 97.8 F (36.6 C) 98.1 F (36.7 C) 97.8 F (36.6 C)  TempSrc: Oral Oral Oral Oral  SpO2: 100% 100% 98% 100%    Intake/Output Summary (Last 24 hours) at 02/26/17 1435 Last data filed at 02/26/17 0850  Gross per 24 hour  Intake                0 ml  Output                0 ml  Net                0 ml   There were no vitals filed for this visit.  Examination:  General exam: Anxious female, not in distress Respiratory system: Clear to auscultation. Respiratory effort normal. No wheezing or crackle Cardiovascular system: S1 & S2 heard, RRR.  No pedal edema. Gastrointestinal system: Abdomen is nondistended, soft and nontender. Normal bowel sounds heard. Central nervous system: Alert and oriented. No focal neurological deficits. Extremities: Symmetric 5 x 5 power. Skin: No rashes, lesions or ulcers Psychiatry: Judgement and insight appear impaired and looks very anxious     Data Reviewed: I have personally reviewed following labs and imaging studies  CBC:  Recent Labs Lab 02/25/17 1134  WBC 4.7  HGB 12.5  HCT 39.3  MCV 97.3  PLT 607   Basic Metabolic Panel:  Recent Labs Lab 02/25/17 1134  NA 140  K 3.8  CL 105  CO2 28  GLUCOSE 109*  BUN <5*  CREATININE 0.81  CALCIUM 10.2   GFR: Estimated Creatinine Clearance: 70.6 mL/min (by C-G formula based on SCr of 0.81 mg/dL). Liver Function Tests:  Recent Labs Lab 02/25/17 1134  AST 22  ALT 15  ALKPHOS 39  BILITOT 0.6  PROT 6.7  ALBUMIN 4.3   No results for input(s): LIPASE, AMYLASE in the last 168 hours. No results for input(s): AMMONIA in  the last 168 hours. Coagulation Profile: No results for input(s): INR, PROTIME in the last 168 hours. Cardiac Enzymes: No results for input(s): CKTOTAL, CKMB, CKMBINDEX, TROPONINI in the last 168 hours. BNP (last 3 results) No results for input(s): PROBNP in the last 8760 hours. HbA1C: No results for input(s): HGBA1C in the last 72 hours. CBG:  Recent Labs Lab 02/25/17 1754  GLUCAP 85   Lipid Profile: No results for input(s): CHOL, HDL, LDLCALC, TRIG, CHOLHDL, LDLDIRECT in the last 72 hours. Thyroid Function Tests: No results for input(s): TSH, T4TOTAL, FREET4, T3FREE, THYROIDAB in the last 72 hours. Anemia Panel: No results for input(s): VITAMINB12, FOLATE, FERRITIN, TIBC, IRON, RETICCTPCT in the last 72 hours. Sepsis Labs:  Recent Labs Lab 02/25/17 1814  LATICACIDVEN 0.76    No results found for this or any previous visit (from the past 240 hour(s)).       Radiology Studies: Dg Chest 2 View  Result Date: 02/25/2017 CLINICAL DATA:  Altered mental status. 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 There is no focal infiltrate. There  is no pleural effusion. No pulmonary parenchymal nodules or masses are observed. The heart and pulmonary vascularity are normal. The power port catheter tip projects over the midportion of the SVC. The bony thorax exhibits no acute abnormality. IMPRESSION: There is no pneumonia, CHF, nor other acute cardiopulmonary abnormality. Electronically Signed   By: David  Martinique M.D.   On: 02/25/2017 16:17   Ct Head Wo Contrast  Result Date: 02/25/2017 CLINICAL DATA:  Altered mental status EXAM: CT HEAD WITHOUT CONTRAST TECHNIQUE: Contiguous axial images were obtained from the base of the skull through the vertex without intravenous contrast. COMPARISON:  12/03/2016.  MRI 12/10/2016. FINDINGS: Brain: Postoperative changes in the left occipital  region. No acute intracranial abnormality. Specifically, no hemorrhage, hydrocephalus, mass lesion, acute infarction, or significant intracranial injury. Vascular: No hyperdense vessel or unexpected calcification. Skull: No acute calvarial abnormality. Prior craniectomy and mesh placement in the left occipital region. Sinuses/Orbits: Visualized paranasal sinuses and mastoids clear. Orbital soft tissues unremarkable. Other: None IMPRESSION: Postoperative changes in the left occipital region. No acute intracranial abnormality. Electronically Signed   By: Rolm Baptise M.D.   On: 02/25/2017 16:06        Scheduled Meds: . anastrozole  1 mg Oral Daily  . cefTRIAXone (ROCEPHIN)  IV  1 g Intravenous Q24H  . enoxaparin (LOVENOX) injection  40 mg Subcutaneous Q24H  . gabapentin  600 mg Oral TID  . lamoTRIgine  100 mg Oral Daily  . lithium carbonate  300 mg Oral BID WC  . loratadine  10 mg Oral Daily  . pantoprazole  40 mg Oral Daily  . potassium chloride  10 mEq Oral Daily  . sodium chloride flush  3 mL Intravenous Q12H  . traZODone  200 mg Oral QHS   Continuous Infusions: . 0.9 % NaCl with KCl 20 mEq / L       LOS: 0 days    Shaleta Ruacho Tanna Furry, MD [facility] Pager [phone]  If 7PM-7AM, please contact night-coverage [email or web address] Password [email or web address] 02/26/2017, 2:35 PM

## 2017-02-26 NOTE — Progress Notes (Signed)
COURTESY NOTE: Appreciate learning of this patient's admission. As of our last restaging, there was no measurable activity from the patient's metastatic cancer.  At this point a brain MRI and chest CT, which had been scheduled for the next week or so, can be moved up to confirm that the problem we are dealing with is not related to the patient's known metastatic disease.  My suspicion is that we are dealing with medication issues related to the patient's psychiatric history.  We'll follow with you. Please let me know if I can be of further help at this point.

## 2017-02-26 NOTE — Progress Notes (Addendum)
Patient wanted to leave the hospital. Given her confusion, I don't think it is safe for her to go by herself. She doesn't have capacity to make clinical decision. Psychiatry consult requested for capacity evaluation.  IVC form signed until evaluation done by psychiatrist for the patient's safety. Please refer to my progress note from today for detail.   Addendum at 4:34 PM Called and discussed with psychiatrist covering now, Dr. Rayne Du. He agreed with IVC and recommended seroquel 50 mg every 6 hrs as needed and minimize ativan use. Psychiatrist will see patient tomorrow morning.

## 2017-02-26 NOTE — Care Management Note (Signed)
Case Management Note  Patient Details  Name: [name] MRN: [id number] Date of Birth: 1961/05/30  Subjective/Objective:            Presents with AMS, hx of remote alcohol abuse, bipolar disorder, cancer of the left breast with brain metastases status post left mastectomy and axillary node dissection with craniectomy and radiation to the brain and breast.  Action/Plan:   Expected Discharge Date:                  Expected Discharge Plan:   ( resided in a halfway house in [place]. Pt HAS 2 son in [place] (Nicolas(DPOA)/ [phone] and [name])  In-House Referral:  Clinical Social Work  Discharge planning Services  CM Consult  Post Acute Care Choice:    Choice offered to:     DME Arranged:    DME Agency:     HH Arranged:    [place] Agency:     Status of Service:  In process, will continue to follow  If discussed at Long Length of Stay Meetings, dates discussed:    Additional Comments:  Sharin Mons, RN 02/26/2017, 4:17 PM

## 2017-02-27 MED ORDER — IBUPROFEN 600 MG PO TABS
600.0000 mg | ORAL_TABLET | Freq: Four times a day (QID) | ORAL | Status: DC | PRN
  Administered 2017-02-28 – 2017-03-01 (×4): 600 mg via ORAL
  Filled 2017-02-27 (×4): qty 1

## 2017-02-27 MED ORDER — ACETAMINOPHEN 325 MG PO TABS
650.0000 mg | ORAL_TABLET | Freq: Four times a day (QID) | ORAL | Status: DC | PRN
  Filled 2017-02-27: qty 2

## 2017-02-27 NOTE — Progress Notes (Addendum)
PROGRESS NOTE    [name]  MCN:[id number] DOB: 04/06/1961 DOA: 02/25/2017 PCP: Arnoldo Morale, MD   Brief Narrative: 56 y.o. female with medical history significant for remote alcohol abuse, bipolar disorder, cancer of the left breast with brain metastases status post left mastectomy and axillary node dissection with craniectomy and radiation to the brain and breast, now presenting to the emergency department with several days of confusion. Since the patient's craniectomy in September 2017, she has had multiple ED visits and an admission to the hospital for confusion. This has seemed to wax and wane, and it is not clear if she completely normalizes between the episodes. She is now brought in after her roommate raises concern for several days of confusion, but is not available at time of admission to assist with additional history. Patient has apparently been intermittently confused, but has not voiced any specific complaints. Patient denies any pain, dyspnea, nausea, vomiting, or diarrhea. She acknowledges her confusion and reports that it began after her brain surgery in September 2017. She also reports an unsteady gait with occasional falls since her brain surgery, but denies any significant injury related to these.  In ED: CT is negative for acute intracranial abnormality.Psychiatry was consulted by the ED physician and feels that the current presentation is not psychiatric.  Assessment & Plan:  #Acute encephalopathy of unknown etiology: -Reportedly patient has intermittent confusion since brain surgery. Head CT with no acute finding. Patient denied any symptoms and wanted to go home today. As per prior note, psychiatry was consulted from the ER. Patient currently has bed sitter for safety. Reportedly patient had multiple admissions for the confusion with no known diagnosis. Unknown if her confusion is related with brain metastases and post surgical effect. -UA showed UTI but urine culture  negative. -MRI of brain ordered, required anesthesia likely on Monday as per radiology. -Patient had workup including TSH, vitamin B12,  ammonia, RPR, HIV antibody unremarkable test result in January 2018. -I discussed with the patient's sister in detail on 02/26/2017. She reported this issue has been going on and was frustrated that there is no confirmed diagnosis.   Patient wanted to go home yesterday and was educated with intermittent confusion. I discussed with the psychiatrist team yesterday and he started Seroquel as needed. The patient was IVC  because of concern of her safety at home and confusion. I do not think patient understand clinical consequences and safety measures. She lives with her room mate. Patient has bed sitter for close observation. Patient is able to ambulate. I will continue current medications. I discussed with the psychiatrist Dr. Adele Schilder for the evaluation of agitation and capacity evaluation. -Social worker was consulted.  -I discussed with the patient's sister over the phone. I explained her about the current condition and involuntary hospital stay because of the concern of her safety and confusion. I also stated that further care depending on psychiatrist evaluation for her agitation and capacity. She verbalized understanding. 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  # Anxiety and history of bipolar disorder:  -Continue Lamictal, Neurontin, lithium, trazodone. Also on Ativan and Seroquel as needed for the anxiety. Continue supportive care and bed sitter. -Continue supportive care  #Metastatic breast cancer: Status post left mastectomy and brain surgery. Recommended to follow-up with oncologist outpatient. MRI brain ordered to further evaluate. Continue Arimidex.  #UTI, site unspecified: Urine culture has no growth. I'll discontinue antibiotics.   Principal Problem:   Acute encephalopathy Active Problems:   H/O alcohol abuse  Metastatic breast cancer (HCC)    Bipolar I disorder, most recent episode depressed ([place])   Acute lower UTI  DVT prophylaxis: Lovenox subcutaneous Code Status: Full code Family Communication: I discussed with patient's sister over the phone. Disposition Plan: Unknown at this time, likely rehabilitation in 1-2 days    Consultants:   Psychiatrist  Procedures: None Antimicrobials: Ceftriaxone on 3/30  Subjective: Patient was seen and examined at bedside. She was anxious and wanted to go home. She said she has carpet, TV and chair at home for her safety. No chest pain, shortness of breath, headache. Sitter present.  Objective: Vitals:   02/26/17 0646 02/26/17 1338 02/26/17 2100 02/27/17 0500  BP: 105/75 113/81 126/69 104/62  Pulse: 86 (!) 104 97 88  Resp: 18 19    Temp: 98.1 F (36.7 C) 97.8 F (36.6 C) 97.6 F (36.4 C) 98.2 F (36.8 C)  TempSrc: Oral Oral Oral Oral  SpO2: 98% 100% 95% 94%    Intake/Output Summary (Last 24 hours) at 02/27/17 1048 Last data filed at 02/26/17 1623  Gross per 24 hour  Intake               50 ml  Output                0 ml  Net               50 ml   There were no vitals filed for this visit.  Examination:  General exam: Anxious female walking in the room Respiratory system: Clear bilateral. Respiratory effort normal. No wheezing or crackle Cardiovascular system: Regular rate rhythm, S1-S2 normal.  No pedal edema. Gastrointestinal system: Abdomen is nondistended, soft and nontender. Normal bowel sounds heard. Central nervous system: Alert and oriented. No focal neurological deficits. Extremities: Symmetric 5 x 5 power. Skin: No rashes, lesions or ulcers Psychiatry: Judgement and insight appear impaired and anxious.     Data Reviewed: I have personally reviewed following labs and imaging studies  CBC:  Recent Labs Lab 02/25/17 1134  WBC 4.7  HGB 12.5  HCT 39.3  MCV 97.3  PLT 174   Basic Metabolic Panel:  Recent Labs Lab 02/25/17 1134  NA 140  K 3.8    CL 105  CO2 28  GLUCOSE 109*  BUN <5*  CREATININE 0.81  CALCIUM 10.2   GFR: Estimated Creatinine Clearance: 70.6 mL/min (by C-G formula based on SCr of 0.81 mg/dL). Liver Function Tests:  Recent Labs Lab 02/25/17 1134  AST 22  ALT 15  ALKPHOS 39  BILITOT 0.6  PROT 6.7  ALBUMIN 4.3   No results for input(s): LIPASE, AMYLASE in the last 168 hours. No results for input(s): AMMONIA in the last 168 hours. Coagulation Profile: No results for input(s): INR, PROTIME in the last 168 hours. Cardiac Enzymes: No results for input(s): CKTOTAL, CKMB, CKMBINDEX, TROPONINI in the last 168 hours. BNP (last 3 results) No results for input(s): PROBNP in the last 8760 hours. HbA1C: No results for input(s): HGBA1C in the last 72 hours. CBG:  Recent Labs Lab 02/25/17 1754  GLUCAP 85   Lipid Profile: No results for input(s): CHOL, HDL, LDLCALC, TRIG, CHOLHDL, LDLDIRECT in the last 72 hours. Thyroid Function Tests: No results for input(s): TSH, T4TOTAL, FREET4, T3FREE, THYROIDAB in the last 72 hours. Anemia Panel: No results for input(s): VITAMINB12, FOLATE, FERRITIN, TIBC, IRON, RETICCTPCT in the last 72 hours. Sepsis Labs:  Recent Labs Lab 02/25/17 1814  LATICACIDVEN 0.76    Recent  Results (from the past 240 hour(s))  Urine culture     Status: None   Collection Time: 02/25/17  6:06 PM  Result Value Ref Range Status   Specimen Description URINE, CLEAN CATCH  Final   Special Requests NONE  Final   Culture NO GROWTH  Final   Report Status 02/26/2017 FINAL  Final         Radiology Studies: Dg Chest 2 View  Result Date: 02/25/2017 CLINICAL DATA:  Altered mental status. 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 There is no focal infiltrate. There is no pleural effusion. No pulmonary parenchymal nodules or masses are observed. The heart and pulmonary vascularity  are normal. The power port catheter tip projects over the midportion of the SVC. The bony thorax exhibits no acute abnormality. IMPRESSION: There is no pneumonia, CHF, nor other acute cardiopulmonary abnormality. Electronically Signed   By: David  Martinique M.D.   On: 02/25/2017 16:17   Ct Head Wo Contrast  Result Date: 02/25/2017 CLINICAL DATA:  Altered mental status EXAM: CT HEAD WITHOUT CONTRAST TECHNIQUE: Contiguous axial images were obtained from the base of the skull through the vertex without intravenous contrast. COMPARISON:  12/03/2016.  MRI 12/10/2016. FINDINGS: Brain: Postoperative changes in the left occipital region. No acute intracranial abnormality. Specifically, no hemorrhage, hydrocephalus, mass lesion, acute infarction, or significant intracranial injury. Vascular: No hyperdense vessel or unexpected calcification. Skull: No acute calvarial abnormality. Prior craniectomy and mesh placement in the left occipital region. Sinuses/Orbits: Visualized paranasal sinuses and mastoids clear. Orbital soft tissues unremarkable. Other: None IMPRESSION: Postoperative changes in the left occipital region. No acute intracranial abnormality. Electronically Signed   By: Rolm Baptise M.D.   On: 02/25/2017 16:06        Scheduled Meds: . anastrozole  1 mg Oral Daily  . cefTRIAXone (ROCEPHIN)  IV  1 g Intravenous Q24H  . enoxaparin (LOVENOX) injection  40 mg Subcutaneous Q24H  . gabapentin  600 mg Oral TID  . lamoTRIgine  100 mg Oral Daily  . lithium carbonate  300 mg Oral BID WC  . loratadine  10 mg Oral Daily  . pantoprazole  40 mg Oral Daily  . potassium chloride  10 mEq Oral Daily  . sodium chloride flush  3 mL Intravenous Q12H  . traZODone  200 mg Oral QHS   Continuous Infusions: . 0.9 % NaCl with KCl 20 mEq / L 75 mL/hr at 02/27/17 0529     LOS: 1 day    Dekota Shenk Tanna Furry, MD [facility] Pager [phone]  If 7PM-7AM, please contact night-coverage [email or web address] Password  [facility] 02/27/2017, 10:48 AM

## 2017-02-27 NOTE — Consult Note (Signed)
[facility] Psychiatry Consult   Reason for Consult:  Capacity Referring Physician:  Dr Carolin Sicks Patient Identification: [name] MRN:  [id number] Principal Diagnosis: Acute encephalopathy Diagnosis:   Patient Active Problem List   Diagnosis Date Noted  . Acute lower UTI [N39.0]   . Hypokalemia [E87.6] 02/18/2017  . Gait abnormality [R26.9] 01/06/2017  . Bipolar I disorder, most recent episode depressed ([place]) [F31.30] 12/08/2016  . Adjustment disorder with anxiety [F43.22] 12/06/2016  . Delirium due to another medical condition [F05] 12/03/2016  . Acute encephalopathy [G93.40] 12/03/2016  . Diarrhea [R19.7] 12/03/2016  . Metastatic breast cancer ([place]) [C50.919]   . Cerumen impaction [H61.20] 11/25/2016  . Otitis media [H66.90] 11/06/2016  . Brain metastasis ([place]) [C79.31] 07/27/2016  . Iron deficiency anemia [D50.9] 06/26/2016  . Bone metastases ([place]) [C79.51] 06/03/2016  . Primary cancer of lower-inner quadrant of left female breast ([place]) [C50.312] 06/01/2016  . Pap smear for cervical cancer screening [Z12.4] 03/28/2015  . Current smoker [F17.200] 03/28/2015  . Healthcare maintenance [Z00.00] 03/28/2015  . Seasonal allergies [J30.2] 03/28/2015  . Anxiety state [F41.1] 02/28/2015  . Fibromyalgia [M79.7] 02/28/2015  . Family history of diabetes mellitus [Z83.3] 02/28/2015  . H/O alcohol abuse [Z87.898]     Total Time spent with patient: 30 minutes  Subjective:   [name] is a 56 y.o. female patient admitted with confusion.  HPI:  Patient is 56 year old Caucasian female with history of remote alcohol abuse, bipolar disorder, cancer of the left breast with brain metastasis status post mastectomy and radiation to the brain.  Consult was called as patient is confused and change in her mental status.  She has multiple emergency room visits for confusion.  She lives with her roommate.  Patient remains very emotional, labile, poor historian.  She continues to have episodes  of confusion.  She is easily tearful.  She denies any depression or suicidal thoughts but endorsed anxious, emotional and sad.  She has difficulty remembering things.  Though she wants to discharge but does not have insight into her illness.  Patient was started Seroquel 25 mg twice a day and she admitted her sleep is improved from the past.  Patient denies any hallucination, paranoia, self abusive behavior.  As per chart she has unsteady gait and occasional fall since the brain surgery.  Currently she is not seeing any psychiatrist but she remember taking medication Lamictal and gabapentin.  She did not recall the dosage.  She endorse lately feeling poor sleep and having racing thoughts.  Past Psychiatric History: Reviewed.  Patient is unable to provide information about her previous psychiatric history.  Risk to Self: Suicidal Ideation: No Suicidal Intent: No Is patient at risk for suicide?: No Suicidal Plan?: No Access to Means: No What has been your use of drugs/alcohol within the last 12 months?: NA How many times?: 0 Other Self Harm Risks: NA Triggers for Past Attempts: None known Intentional Self Injurious Behavior: None Risk to Others: Homicidal Ideation: No Thoughts of Harm to Others: No Current Homicidal Intent: No Current Homicidal Plan: No Access to Homicidal Means: No Identified Victim: NA History of harm to others?: No Assessment of Violence: None Noted Violent Behavior Description: NA Does patient have access to weapons?: No Criminal Charges Pending?: No Does patient have a court date: No Prior Inpatient Therapy: Prior Inpatient Therapy: Yes Prior Therapy Dates: 2017 Prior Therapy Facilty/Provider(s): [facility] Reason for Treatment: MH issues Prior Outpatient Therapy: Prior Outpatient Therapy: Yes Prior Therapy Dates: 2017 Prior Therapy Facilty/Provider(s): [facility]  Reason for Treatment: MH issues Does patient have an ACCT team?: No Does patient have  Intensive In-House Services?  : No Does patient have [facility] services? : No Does patient have P4CC services?: No  Past Medical History:  Past Medical History:  Diagnosis Date  . Alcohol abuse   . Anemia    during chemo  . Anxiety    At age 83  . Arthritis Dx 2010  . Bipolar disorder ([place])   . Cancer ([place])    breast mets to brain  . Chronic pain   . Complication of anesthesia   . Depression   . Fibromyalgia Dx 2005  . GERD (gastroesophageal reflux disease)   . Headache    hx  migraines  . Opiate dependence ([place])   . PONV (postoperative nausea and vomiting)   . Port-a-cath in place   . PTSD (post-traumatic stress disorder)     Past Surgical History:  Procedure Laterality Date  . APPLICATION OF CRANIAL NAVIGATION N/A 08/14/2016   Procedure: APPLICATION OF CRANIAL NAVIGATION;  Surgeon: Erline Levine, MD;  Location: [facility];  Service: Neurosurgery;  Laterality: N/A;  . BREAST RECONSTRUCTION Left    with silicone implant  . CRANIOTOMY N/A 08/14/2016   Procedure: CRANIOTOMY TUMOR EXCISION WITH Lucky Rathke;  Surgeon: Erline Levine, MD;  Location: [facility];  Service: Neurosurgery;  Laterality: N/A;  . FIBULA FRACTURE SURGERY Left   . MASTECTOMY Left   . RADIOLOGY WITH ANESTHESIA N/A 07/23/2016   Procedure: MRI OF BRAIN WITH AND WITHOUT;  Surgeon: Medication Radiologist, MD;  Location: [facility];  Service: Radiology;  Laterality: N/A;  . RADIOLOGY WITH ANESTHESIA N/A 09/08/2016   Procedure: MRI OF BRAIN WITH AND WITHOUT CONTRAST;  Surgeon: Medication Radiologist, MD;  Location: [facility];  Service: Radiology;  Laterality: N/A;  . RADIOLOGY WITH ANESTHESIA N/A 12/10/2016   Procedure: MRI OF BRAIN WITH AND WITHOUT;  Surgeon: Medication Radiologist, MD;  Location: [facility];  Service: Radiology;  Laterality: N/A;  . right power port placement Right    Family History:  Family History  Problem Relation Age of Onset  . Diabetes Mother   . Bipolar disorder Mother   . CAD Father    Family  Psychiatric  History: Reviewed. Social History:  History  Alcohol Use No    Comment: no ETOH since 08/22/12     History  Drug Use No    Comment: prescription opiates from the street daily until Sunday    Social History   Social History  . Marital status: Single    Spouse name: N/A  . Number of children: N/A  . Years of education: N/A   Social History Main Topics  . Smoking status: Current Every Day Smoker    Packs/day: 0.25    Types: Cigarettes  . Smokeless tobacco: Never Used     Comment: Pt is on Chantix at present time  . Alcohol use No     Comment: no ETOH since 08/22/12  . Drug use: No     Comment: prescription opiates from the street daily until Sunday  . Sexual activity: No     Comment: ablation   Other Topics Concern  . None   Social History Narrative  . None   Additional Social History:    Allergies:   Allergies  Allergen Reactions  . Demerol Itching and Nausea And Vomiting  . Erythromycin Rash    Labs:  Results for orders placed or performed during the hospital encounter of 02/25/17 (from  the past 48 hour(s))  CBG monitoring, ED     Status: None   Collection Time: 02/25/17  5:54 PM  Result Value Ref Range   Glucose-Capillary 85 65 - 99 mg/dL  Urinalysis, Routine w reflex microscopic     Status: Abnormal   Collection Time: 02/25/17  6:06 PM  Result Value Ref Range   Color, Urine STRAW (A) YELLOW   APPearance CLEAR CLEAR   Specific Gravity, Urine 1.006 1.005 - 1.030   pH 8.0 5.0 - 8.0   Glucose, UA NEGATIVE NEGATIVE mg/dL   Hgb urine dipstick NEGATIVE NEGATIVE   Bilirubin Urine NEGATIVE NEGATIVE   Ketones, ur 5 (A) NEGATIVE mg/dL   Protein, ur NEGATIVE NEGATIVE mg/dL   Nitrite NEGATIVE NEGATIVE   Leukocytes, UA SMALL (A) NEGATIVE   RBC / HPF 0-5 0 - 5 RBC/hpf   WBC, UA 6-30 0 - 5 WBC/hpf   Bacteria, UA NONE SEEN NONE SEEN   Squamous Epithelial / LPF NONE SEEN NONE SEEN   Mucous PRESENT   Urine culture     Status: None   Collection Time:  02/25/17  6:06 PM  Result Value Ref Range   Specimen Description URINE, CLEAN CATCH    Special Requests NONE    Culture NO GROWTH    Report Status 02/26/2017 FINAL   Rapid urine drug screen (hospital performed)     Status: None   Collection Time: 02/25/17  6:06 PM  Result Value Ref Range   Opiates NONE DETECTED NONE DETECTED   Cocaine NONE DETECTED NONE DETECTED   Benzodiazepines NONE DETECTED NONE DETECTED   Amphetamines NONE DETECTED NONE DETECTED   Tetrahydrocannabinol NONE DETECTED NONE DETECTED   Barbiturates NONE DETECTED NONE DETECTED    Comment:        DRUG SCREEN FOR MEDICAL PURPOSES ONLY.  IF CONFIRMATION IS NEEDED FOR ANY PURPOSE, NOTIFY LAB WITHIN 5 DAYS.        LOWEST DETECTABLE LIMITS FOR URINE DRUG SCREEN Drug Class       Cutoff (ng/mL) Amphetamine      1000 Barbiturate      200 Benzodiazepine   413 Tricyclics       244 Opiates          300 Cocaine          300 THC              50   I-Stat CG4 Lactic Acid, ED     Status: None   Collection Time: 02/25/17  6:14 PM  Result Value Ref Range   Lactic Acid, Venous 0.76 0.5 - 1.9 mmol/L  Lithium level     Status: None   Collection Time: 02/25/17 10:19 PM  Result Value Ref Range   Lithium Lvl 0.85 0.60 - 1.20 mmol/L    Current Facility-Administered Medications  Medication Dose Route Frequency Provider Last Rate Last Dose  . 0.9 %  sodium chloride infusion  250 mL Intravenous PRN Vianne Bulls, MD      . anastrozole (ARIMIDEX) tablet 1 mg  1 mg Oral Daily Vianne Bulls, MD   1 mg at 02/27/17 0102  . enoxaparin (LOVENOX) injection 40 mg  40 mg Subcutaneous Q24H Ilene Qua Opyd, MD   40 mg at 02/26/17 2210  . gabapentin (NEURONTIN) capsule 600 mg  600 mg Oral TID Vianne Bulls, MD   600 mg at 02/27/17 0831  . lamoTRIgine (LAMICTAL) tablet 100 mg  100 mg Oral Daily Vianne Bulls, MD  100 mg at 02/27/17 0832  . lithium carbonate capsule 300 mg  300 mg Oral BID WC Vianne Bulls, MD   300 mg at 02/27/17 9381  .  loratadine (CLARITIN) tablet 10 mg  10 mg Oral Daily Vianne Bulls, MD   10 mg at 02/27/17 8299  . LORazepam (ATIVAN) tablet 0.5 mg  0.5 mg Oral Q6H PRN Dron Tanna Furry, MD   0.5 mg at 02/26/17 2307   Or  . LORazepam (ATIVAN) injection 0.5 mg  0.5 mg Intramuscular Q6H PRN Dron Tanna Furry, MD   0.5 mg at 02/27/17 3716  . ondansetron (ZOFRAN) tablet 4 mg  4 mg Oral Q6H PRN Vianne Bulls, MD       Or  . ondansetron (ZOFRAN) injection 4 mg  4 mg Intravenous Q6H PRN Vianne Bulls, MD      . pantoprazole (PROTONIX) EC tablet 40 mg  40 mg Oral Daily Vianne Bulls, MD   40 mg at 02/27/17 0831  . potassium chloride SA (K-DUR,KLOR-CON) CR tablet 10 mEq  10 mEq Oral Daily Vianne Bulls, MD   10 mEq at 02/27/17 0834  . QUEtiapine (SEROQUEL) tablet 50 mg  50 mg Oral Q6H PRN Dron Tanna Furry, MD   50 mg at 02/27/17 0855  . sodium chloride flush (NS) 0.9 % injection 3 mL  3 mL Intravenous Q12H Vianne Bulls, MD   3 mL at 02/27/17 0839  . sodium chloride flush (NS) 0.9 % injection 3 mL  3 mL Intravenous PRN Vianne Bulls, MD      . traZODone (DESYREL) tablet 200 mg  200 mg Oral QHS Vianne Bulls, MD   200 mg at 02/26/17 2210    Musculoskeletal: Strength & Muscle Tone: within normal limits Gait & Station: Patient lying on the bed Patient leans: N/A  Psychiatric Specialty Exam: Physical Exam  ROS  Blood pressure (!) 107/59, pulse 91, temperature 98 F (36.7 C), temperature source Oral, resp. rate 16, SpO2 96 %.There is no height or weight on file to calculate BMI.  General Appearance: Casual and Guarded  Eye Contact:  Fair  Speech:  Slow  Volume:  Decreased  Mood:  Anxious and Depressed  Affect:  Depressed and Labile  Thought Process:  Irrelevant  Orientation:  Other:  Difficult to remember  Thought Content:  Rumination  Suicidal Thoughts:  No  Homicidal Thoughts:  No  Memory:  Immediate;   Poor Recent;   Fair Remote;   Poor  Judgement:  Impaired  Insight:  Lacking   Psychomotor Activity:  Normal  Concentration:  Concentration: Poor and Attention Span: Poor  Recall:  Poor  Fund of Knowledge:  Fair  Language:  Fair  Akathisia:  No  Handed:  Right  AIMS (if indicated):     Assets:  Desire for Improvement  ADL's:  Intact  Cognition:  Impaired,  Moderate  Sleep:        Treatment Plan Summary: Plan Patient does not have capacity to make clinical decision. continue Seroquel 50 mg , trazodone 200 mg at bedtime .  Patient also taking Lamictal 100 mg and currently she has no side effects.  Consider increasing Lamictal 150 mg if not contraindicated.  Patient does not meet criteria for inpatient psychiatric treatment.  Please call social worker for appropriate discharge planning.  Disposition: No evidence of imminent risk to self or others at present.   Patient does not meet criteria for psychiatric inpatient admission. Supportive  therapy provided about ongoing stressors.  Clarke Peretz T., MD 02/27/2017 2:18 PM

## 2017-02-27 NOTE — Progress Notes (Signed)
Pt reports having headache. No pain med on file for pt. Md was paged if she can have something. Will continue to monitor.

## 2017-02-28 MED ORDER — TRAMADOL HCL 50 MG PO TABS
50.0000 mg | ORAL_TABLET | Freq: Four times a day (QID) | ORAL | Status: DC | PRN
  Administered 2017-02-28 – 2017-03-03 (×5): 50 mg via ORAL
  Filled 2017-02-28 (×5): qty 1

## 2017-02-28 MED ORDER — QUETIAPINE FUMARATE 25 MG PO TABS
50.0000 mg | ORAL_TABLET | Freq: Two times a day (BID) | ORAL | Status: DC
  Administered 2017-02-28 – 2017-03-03 (×6): 50 mg via ORAL
  Filled 2017-02-28 (×6): qty 2

## 2017-02-28 MED ORDER — ALUM & MAG HYDROXIDE-SIMETH 200-200-20 MG/5ML PO SUSP
15.0000 mL | ORAL | Status: DC | PRN
  Administered 2017-02-28: 15 mL via ORAL
  Filled 2017-02-28: qty 30

## 2017-02-28 MED ORDER — LAMOTRIGINE 25 MG PO TABS
150.0000 mg | ORAL_TABLET | Freq: Every day | ORAL | Status: DC
  Administered 2017-02-28 – 2017-03-03 (×4): 150 mg via ORAL
  Filled 2017-02-28 (×4): qty 2

## 2017-02-28 NOTE — Evaluation (Signed)
Occupational Therapy Evaluation Patient Details Name: [name] MRN: [id number] DOB: 10-Oct-1961 Today's Date: 02/28/2017    History of Present Illness Pt is a 56 y.o. female who presented to the ED with confusion. She has a medical history significant for remote alcohol abuse, bipolar disorder, cancer of the left breast with brain metastases status post left mastectomy and axillary node dissection with craniectomy and radiation to the brain and breast. Since the patient's craniectomy in September 2017, she has had multiple ED visits and an admission to the hospital for confusion. This has seemed to wax and wane, and it is not clear if she completely normalizes between the episodes.   Clinical Impression   PTA, pt reports independence with ADL and functional mobility and living with 2 roommates. Pt currently requires min guard assist overall for ADL due to generalized UE weakness, decreased stability with ambulation, impulsivity, and cognitive deficits. She was highly confused during OT evaluation frequently talking to friends/family who were not in the room. She additionally demonstrated decreased problem solving and decreased ability to follow commands. Pt easily breaks down into tears when asked about family but quickly recovers to a pleasant disposition. Feel pt will need 24 hour assistance post-acute D/C as well as home health therapies and aide to ensure safety in the home. OT will continue to follow acutely.    Follow Up Recommendations  Home health OT;Supervision/Assistance - 24 hour    Equipment Recommendations  3 in 1 bedside commode;Tub/shower seat    Recommendations for Other Services       Precautions / Restrictions Precautions Precautions: Fall Precaution Comments: Impulsive and unsteady Restrictions Weight Bearing Restrictions: No      Mobility Bed Mobility Overal bed mobility: Needs Assistance Bed Mobility: Supine to Sit;Sit to Supine     Supine to sit: Min  guard Sit to supine: Min guard   General bed mobility comments: Min guard assist for safety and pt highly impulsive. When pt becomes agitated sits up quickly and unsafely attempts to exit bed.   Transfers Overall transfer level: Needs assistance Equipment used: None Transfers: Sit to/from Stand Sit to Stand: Min guard         General transfer comment: Min guard for safety as pt unsteady and impulsive.    Balance Overall balance assessment: Needs assistance Sitting-balance support: No upper extremity supported;Feet supported Sitting balance-Leahy Scale: Good     Standing balance support: No upper extremity supported;During functional activity Standing balance-Leahy Scale: Fair Standing balance comment: Min guard assist for stability.                           ADL either performed or assessed with clinical judgement   ADL Overall ADL's : Needs assistance/impaired Eating/Feeding: Supervision/ safety;Minimal assistance;Sitting   Grooming: Min guard;Wash/dry hands;Standing   Upper Body Bathing: Sitting;Supervision/ safety   Lower Body Bathing: Min guard;Sit to/from stand   Upper Body Dressing : Supervision/safety;Sitting   Lower Body Dressing: Min guard;Sit to/from stand   Toilet Transfer: Min guard;Ambulation;Regular Glass blower/designer Details (indicate cue type and reason): Pt unsteady and with staggering gait during session.' Toileting- Clothing Manipulation and Hygiene: Min guard;Sit to/from stand       Functional mobility during ADLs: Min guard General ADL Comments: Pt impulsive throughout with poor short-term memory and safety awareness. Moves quickly to sit even when not close to the bed/chair. Requires min guard assist to maintain safety.     Vision Baseline Vision/History: Wears glasses Wears  Glasses:  (difficulty reporting) Vision Assessment?: Vision impaired- to be further tested in functional context     Perception     Praxis       Pertinent Vitals/Pain Pain Assessment: Faces Faces Pain Scale: Hurts a little bit Pain Location: R UE when pressing remote buttons Pain Descriptors / Indicators: Discomfort Pain Intervention(s): Monitored during session     Hand Dominance     Extremity/Trunk Assessment Upper Extremity Assessment Upper Extremity Assessment: Generalized weakness   Lower Extremity Assessment Lower Extremity Assessment: Generalized weakness       Communication Communication Communication: No difficulties   Cognition Arousal/Alertness: Awake/alert Behavior During Therapy: Impulsive Overall Cognitive Status: No family/caregiver present to determine baseline cognitive functioning Area of Impairment: Orientation;Memory;Following commands;Safety/judgement;Awareness;Problem solving;Attention                 Orientation Level: Disoriented to;Situation Current Attention Level: Selective Memory: Decreased short-term memory Following Commands: Follows one step commands inconsistently Safety/Judgement: Decreased awareness of safety;Decreased awareness of deficits Awareness: Intellectual Problem Solving: Slow processing;Decreased initiation;Difficulty sequencing;Requires verbal cues General Comments: Pt frequently talking to people in the room although no family or visitors present despite explaining to pt that no one else was in the room. Pt impulsive and emotionally labile during session.    General Comments       Exercises     Shoulder Instructions      Home Living Family/patient expects to be discharged to:: Private residence Living Arrangements: Non-relatives/Friends Available Help at Discharge: Friend(s);Available PRN/intermittently (reports "Scott" (roommate's brother) will assist with HH) Type of Home: House       Home Layout: One level         Bathroom Toilet: Standard     Home Equipment: Walker - 2 wheels;Shower seat   Additional Comments: Question reliability of pt's  report      Prior Functioning/Environment Level of Independence: Independent        Comments: Reports independence but question reliability of pt's report.        OT Problem List: Decreased strength;Decreased activity tolerance;Impaired balance (sitting and/or standing);Decreased safety awareness;Decreased knowledge of use of DME or AE;Decreased knowledge of precautions      OT Treatment/Interventions: Self-care/ADL training;Therapeutic exercise;Energy conservation;DME and/or AE instruction;Therapeutic activities;Patient/family education;Balance training    OT Goals(Current goals can be found in the care plan section) Acute Rehab OT Goals Patient Stated Goal: to get "Scott" to help with home health OT Goal Formulation: With patient Time For Goal Achievement: 03/14/17 Potential to Achieve Goals: Good  OT Frequency: Min 1X/week   Barriers to D/C:            Co-evaluation              End of Session Equipment Utilized During Treatment: Gait belt  Activity Tolerance: Patient tolerated treatment well Patient left: in bed;with call bell/phone within reach;with bed alarm set  OT Visit Diagnosis: Muscle weakness (generalized) (M62.81);Other abnormalities of gait and mobility (R26.89)                Time: 8144-8185 OT Time Calculation (min): 24 min Charges:  OT General Charges $OT Visit: 1 Procedure OT Evaluation $OT Eval Moderate Complexity: 1 Procedure OT Treatments $Self Care/Home Management : 8-22 mins G-Codes:     Norman Herrlich, MS OTR/L  Pager: [name] 02/28/2017, 3:29 PM

## 2017-02-28 NOTE — Progress Notes (Signed)
Called MRI  at [phone] and spoke to [facility] and was informed that MD will need to call anesthesia to arrange MRI, and depending on their schedule MRI can be possibly  arranged for tomorrow. Notified MD of this via text page in amnion.

## 2017-02-28 NOTE — Progress Notes (Signed)
PROGRESS NOTE    [name]  YOV:[id number] DOB: 1961-05-31 DOA: 02/25/2017 PCP: Arnoldo Morale, MD   Brief Narrative: 56 y.o. female with medical history significant for remote alcohol abuse, bipolar disorder, cancer of the left breast with brain metastases status post left mastectomy and axillary node dissection with craniectomy and radiation to the brain and breast, now presenting to the emergency department with several days of confusion. Since the patient's craniectomy in September 2017, she has had multiple ED visits and an admission to the hospital for confusion. This has seemed to wax and wane, and it is not clear if she completely normalizes between the episodes. She is now brought in after her roommate raises concern for several days of confusion, but is not available at time of admission to assist with additional history. Patient has apparently been intermittently confused, but has not voiced any specific complaints. Patient denies any pain, dyspnea, nausea, vomiting, or diarrhea. She acknowledges her confusion and reports that it began after her brain surgery in September 2017. She also reports an unsteady gait with occasional falls since her brain surgery, but denies any significant injury related to these.  In ED: CT is negative for acute intracranial abnormality.Psychiatry was consulted by the ED physician and feels that the current presentation is not psychiatric.  Assessment & Plan:  #Acute encephalopathy of unknown etiology: -Reportedly patient has intermittent confusion since brain surgery. Head CT with no acute finding. Patient denied any symptoms and wanted to go home today. As per prior note, psychiatry was consulted from the ER. Patient currently has bed sitter for safety. Reportedly patient had multiple admissions for the confusion with no known diagnosis. Unknown if her confusion is related with brain metastases and post surgical effect. -MRI of brain ordered, required  anesthesia likely on Monday as per radiology. Needs to get consent from patient's family member. -Patient had workup including TSH, vitamin B12,  ammonia, RPR, HIV antibody unremarkable test result in January 2018. -I discussed with the patient's sister in detail on 02/26/2017 and 02/27/2017. She reported this issue has been going on and was frustrated that there is no confirmed diagnosis.   Patient with confusion. She did not recall that I saw her yesterday. She has bed sitter. Evaluated by psychiatrist who thought the patient has no capacity to make clinical decision. Patient is currently on IVC and therefore not able to leave AMA. Continue bed sitter. Social worker consulted for discharge planning including rehabilitation versus home with home care services. I started Seroquel 50 twice a day and increase the dose of Lamictal as per psychiatrist recommendation. Continue current medication. Added tramadol as needed for the pain medication which is patient's home medications.  # Anxiety and history of bipolar disorder:  -Continue Lamictal, Neurontin, lithium, trazodone, seroquel. May need to adjust the dose of Seroquel. Continue supportive care and bed sitter. -Continue supportive care  #Metastatic breast cancer: Status post left mastectomy and brain surgery. Recommended to follow-up with oncologist outpatient. MRI brain ordered to further evaluate. Continue Arimidex.  #UTI, site unspecified: Urine culture has no growth. I'll discontinue antibiotics.   Principal Problem:   Acute encephalopathy Active Problems:   H/O alcohol abuse   Metastatic breast cancer (HCC)   Bipolar I disorder, most recent episode depressed ([place])   Acute lower UTI  DVT prophylaxis: Lovenox subcutaneous Code Status: Full code Family Communication: Discussed with the patient's sister on 3/30 and 3/31 Disposition Plan: Unknown at this time, likely rehabilitation in 1-2 days    Consultants:  Psychiatrist  Procedures: None Antimicrobials: Ceftriaxone on 3/30  Subjective: Patient was seen and examined at bedside. She reported she has not received any of her home medications and she did not recall that she saw me yesterday. She clearly has confusion. She wants to go home. Denied any other symptoms according headache, dizziness, nausea vomiting chest pain or shortness of breath.  Objective: Vitals:   02/27/17 0500 02/27/17 1405 02/27/17 2300 02/28/17 0614  BP: 104/62 (!) 107/59 100/67 93/81  Pulse: 88 91 87 81  Resp:  16 16 16   Temp: 98.2 F (36.8 C) 98 F (36.7 C) 98.1 F (36.7 C) 98.2 F (36.8 C)  TempSrc: Oral Oral Oral Oral  SpO2: 94% 96% 96% 99%    Intake/Output Summary (Last 24 hours) at 02/28/17 1050 Last data filed at 02/28/17 0830  Gross per 24 hour  Intake              200 ml  Output                0 ml  Net              200 ml   There were no vitals filed for this visit.  Examination:  General exam: Anxious female ambulating in the room Respiratory system: Clear bilateral. Respiratory effort normal. No wheezing or crackle Cardiovascular system: Regular rate rhythm, S1-S2 normal.  No pedal edema. Gastrointestinal system: Abdomen is nondistended, soft and nontender. Normal bowel sounds heard. Central nervous system: Alert and oriented. No focal neurological deficits. Extremities: Symmetric 5 x 5 power. Skin: No rashes, lesions or ulcers Psychiatry: Judgement and insight appear impaired and anxious.     Data Reviewed: I have personally reviewed following labs and imaging studies  CBC:  Recent Labs Lab 02/25/17 1134  WBC 4.7  HGB 12.5  HCT 39.3  MCV 97.3  PLT 161   Basic Metabolic Panel:  Recent Labs Lab 02/25/17 1134  NA 140  K 3.8  CL 105  CO2 28  GLUCOSE 109*  BUN <5*  CREATININE 0.81  CALCIUM 10.2   GFR: Estimated Creatinine Clearance: 70.6 mL/min (by C-G formula based on SCr of 0.81 mg/dL). Liver Function  Tests:  Recent Labs Lab 02/25/17 1134  AST 22  ALT 15  ALKPHOS 39  BILITOT 0.6  PROT 6.7  ALBUMIN 4.3   No results for input(s): LIPASE, AMYLASE in the last 168 hours. No results for input(s): AMMONIA in the last 168 hours. Coagulation Profile: No results for input(s): INR, PROTIME in the last 168 hours. Cardiac Enzymes: No results for input(s): CKTOTAL, CKMB, CKMBINDEX, TROPONINI in the last 168 hours. BNP (last 3 results) No results for input(s): PROBNP in the last 8760 hours. HbA1C: No results for input(s): HGBA1C in the last 72 hours. CBG:  Recent Labs Lab 02/25/17 1754  GLUCAP 85   Lipid Profile: No results for input(s): CHOL, HDL, LDLCALC, TRIG, CHOLHDL, LDLDIRECT in the last 72 hours. Thyroid Function Tests: No results for input(s): TSH, T4TOTAL, FREET4, T3FREE, THYROIDAB in the last 72 hours. Anemia Panel: No results for input(s): VITAMINB12, FOLATE, FERRITIN, TIBC, IRON, RETICCTPCT in the last 72 hours. Sepsis Labs:  Recent Labs Lab 02/25/17 1814  LATICACIDVEN 0.76    Recent Results (from the past 240 hour(s))  Urine culture     Status: None   Collection Time: 02/25/17  6:06 PM  Result Value Ref Range Status   Specimen Description URINE, CLEAN CATCH  Final   Special Requests NONE  Final   Culture NO GROWTH  Final   Report Status 02/26/2017 FINAL  Final         Radiology Studies: No results found.      Scheduled Meds: . anastrozole  1 mg Oral Daily  . enoxaparin (LOVENOX) injection  40 mg Subcutaneous Q24H  . gabapentin  600 mg Oral TID  . lamoTRIgine  150 mg Oral Daily  . lithium carbonate  300 mg Oral BID WC  . loratadine  10 mg Oral Daily  . pantoprazole  40 mg Oral Daily  . potassium chloride  10 mEq Oral Daily  . QUEtiapine  50 mg Oral BID  . sodium chloride flush  3 mL Intravenous Q12H  . traZODone  200 mg Oral QHS   Continuous Infusions:    LOS: 2 days    Pamula Luther Tanna Furry, MD [facility] Pager  [phone]  If 7PM-7AM, please contact night-coverage [email or web address] Password [email or web address] 02/28/2017, 10:50 AM

## 2017-03-01 DIAGNOSIS — G9340 Encephalopathy, unspecified: Principal | ICD-10-CM

## 2017-03-01 DIAGNOSIS — Z818 Family history of other mental and behavioral disorders: Secondary | ICD-10-CM

## 2017-03-01 DIAGNOSIS — C7931 Secondary malignant neoplasm of brain: Secondary | ICD-10-CM

## 2017-03-01 DIAGNOSIS — C7951 Secondary malignant neoplasm of bone: Secondary | ICD-10-CM

## 2017-03-01 DIAGNOSIS — C50312 Malignant neoplasm of lower-inner quadrant of left female breast: Secondary | ICD-10-CM

## 2017-03-01 DIAGNOSIS — F05 Delirium due to known physiological condition: Secondary | ICD-10-CM

## 2017-03-01 DIAGNOSIS — F319 Bipolar disorder, unspecified: Secondary | ICD-10-CM

## 2017-03-01 DIAGNOSIS — Z170 Estrogen receptor positive status [ER+]: Secondary | ICD-10-CM

## 2017-03-01 DIAGNOSIS — Z79899 Other long term (current) drug therapy: Secondary | ICD-10-CM

## 2017-03-01 DIAGNOSIS — F17210 Nicotine dependence, cigarettes, uncomplicated: Secondary | ICD-10-CM

## 2017-03-01 NOTE — Progress Notes (Signed)
Kenneth Lax   DOB:28-Dec-1960   HE#:[id number]   NTI#:[id number]  Subjective:  [name] tells me she is wobbly but walking better. Her appetite has come back and she is having regular normal BMs. She wants to go home and feels she is in prison here. She was able to tell me she came in because her "roommates" were worried about her. She thinks she may be ging home today. Sitter in room   Objective: middle aged White woman examined in a chair by the bed  Vitals:   02/28/17 2240 03/01/17 0600  BP: 112/61 97/62  Pulse: 87   Resp: 20 20  Temp: 98.1 F (36.7 C) 98.8 F (37.1 C)    There is no height or weight on file to calculate BMI.  Intake/Output Summary (Last 24 hours) at 03/01/17 0720 Last data filed at 02/28/17 0830  Gross per 24 hour  Intake              200 ml  Output                0 ml  Net              200 ml     EOMs intact  No cervical or supraclavicular adenopathy  Lungs no rales or wheezes--auscultated anterolaterally  Heart regular rate and rhythm  Abdomen soft, +BS  Neuro nonfocal; balance appears poor; alert and oriented x3; was able to read off her family's phone numbers from her phone; discussed her reasons not to be able to undergo brain MRI (claustrophobia)  Breast exam: deferred  CBG (last 3)  No results for input(s): GLUCAP in the last 72 hours.   Labs:  Lab Results  Component Value Date   WBC 4.7 02/25/2017   HGB 12.5 02/25/2017   HCT 39.3 02/25/2017   MCV 97.3 02/25/2017   PLT 181 02/25/2017   NEUTROABS 4.5 02/12/2017    @LASTCHEMISTRY @  Urine Studies No results for input(s): UHGB, CRYS in the last 72 hours.  Invalid input(s): UACOL, UAPR, USPG, UPH, UTP, UGL, UKET, UBIL, UNIT, UROB, ULEU, UEPI, UWBC, URBC, UBAC, CAST, UCOM, BILUA  Basic Metabolic Panel:  Recent Labs Lab 02/25/17 1134  NA 140  K 3.8  CL 105  CO2 28  GLUCOSE 109*  BUN <5*  CREATININE 0.81  CALCIUM 10.2   GFR Estimated Creatinine Clearance: 70.6 mL/min (by C-G formula  based on SCr of 0.81 mg/dL). Liver Function Tests:  Recent Labs Lab 02/25/17 1134  AST 22  ALT 15  ALKPHOS 39  BILITOT 0.6  PROT 6.7  ALBUMIN 4.3   No results for input(s): LIPASE, AMYLASE in the last 168 hours. No results for input(s): AMMONIA in the last 168 hours. Coagulation profile No results for input(s): INR, PROTIME in the last 168 hours.  CBC:  Recent Labs Lab 02/25/17 1134  WBC 4.7  HGB 12.5  HCT 39.3  MCV 97.3  PLT 181   Cardiac Enzymes: No results for input(s): CKTOTAL, CKMB, CKMBINDEX, TROPONINI in the last 168 hours. BNP: Invalid input(s): POCBNP CBG:  Recent Labs Lab 02/25/17 1754  GLUCAP 85   D-Dimer No results for input(s): DDIMER in the last 72 hours. Hgb A1c No results for input(s): HGBA1C in the last 72 hours. Lipid Profile No results for input(s): CHOL, HDL, LDLCALC, TRIG, CHOLHDL, LDLDIRECT in the last 72 hours. Thyroid function studies No results for input(s): TSH, T4TOTAL, T3FREE, THYROIDAB in the last 72 hours.  Invalid input(s): FREET3 Anemia work up  No results for input(s): VITAMINB12, FOLATE, FERRITIN, TIBC, IRON, RETICCTPCT in the last 72 hours. Microbiology Recent Results (from the past 240 hour(s))  Urine culture     Status: None   Collection Time: 02/25/17  6:06 PM  Result Value Ref Range Status   Specimen Description URINE, CLEAN CATCH  Final   Special Requests NONE  Final   Culture NO GROWTH  Final   Report Status 02/26/2017 FINAL  Final      Studies:  No results found.  Assessment: 56 y.o.  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  (2) status post left mastectomy and axillary lymph node dissection  with immediate expander placement  07/18/2015 for an mpT4 pN2,stage IIIB invasive ductal carcinoma, grade 3, with negative margins.             (a) definitive implant exchange to be scheduled in December            METASTATIC DISEASE: October 2016  (3) CT scan of the chest abdomen and pelvis  09/16/2015 shows metastatic lesions in the right scapula, left iliac crest, L4, and T spine. There were questionable liver cysts, with repeat CT scan 03/02/2016 showing possible right upper lobe lung lesions and possibly increased liver lesions             (a) CT scan of the chest 06/17/2016 shows no active disease in the lungs or liver             (b) Bone scan July 2017 showed no evidence of bony metastatic disease              (c) head CT 07/08/2016 showed a cerebellar lesion, confirmed by MRI 07/23/2016, status post craniotomy 08/14/2016, confirming a metastatic deposit which was estrogen and progesterone receptor negative, HER-2 amplified with a signals ratio of 7.16, number per cell 13.25             (d) CA 27-29 not informative  (4) received docetaxel every 3 weeks 6 together with trastuzumab and pertuzumab, last docetaxel dose 02/11/2016  (5) adjuvant radiation7/03/2016 to 06/26/2016 at [place]: 1. The Left chest wall was treated to 23.4 Gy in 13 fractions at 1.8 Gy per fraction. 2. The Left chest wall was boosted to 10 Gy in 5 fractions at 2 Gy per fraction. 3. The Left Sclav/PAB was treated to 23.4 Gy in 13 fractions at 1.8 Gy per fraction.  Including the patient's treatment in [place] (received 15 fractions per Dr. Maryan Rued near [place], [place]), the patient received 50.4 Gy to the left chest wall and supraclavicular region.   (6) started trastuzumab and pertuzumab October 2016, continuing every 3 weeks,             (a) echocardiogram 02/26/2016 showed a well preserved ejection fraction             (b) echocardiogram 07/01/2016 shows an ejection fraction in the 60-65%              (c) pertuzumab discontinued 08/25/2016  with uncontrolled diarrhea             (d) echocardiogram 11/11/2016 showed an ejection fraction in the 60-65%  (7) started denosumab/Xgeva October 2017, repeated every 6 weeks  (8) started anastrozole October 2017              (  a) bone scan 11/10/2016 shows no active disease             (b) chest CT scan 11/10/2016 stable, with no evidence of active disease  (9) history of bipolar disorder             (a) lithium level checked every 6 weeks  (10) mild anemia with a significant drop in the MCV, ferritin 10 06/03/2016,              (a) Feraheme given 06/12/2016 and 06/18/2016  (11) tobacco abuse: Chantix started 06/18/2016  (12) 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             (a) brain irradiation10/19/17-11/08/17:Whole brain/ 35 Gy in 14 fractions              (b) repeat brain MRI obtained 12/10/2016 shows no active disease in the brain  Plan:  I spent approximately 40 minutes with the patient trying to clarify her situation. She is currently A&O x3 and appears competent to make medical decisions. However she tells me  (a) if she gets confused again she wants her sons [name] [phone]) and [name] [phone]) "to decide"  (b) she tells me she has a completed/notarized HCPOA document in her home, naming her sister Page as Chauncey Reading; Page's number is [phone]  When we discussed the fact that (a) and (b) above are not in line she tells me "I need to talk to them." Accordingly at this point it is not clear to me that we have one person to help make decisions next time Zakaiya becomes confused. It would be very helpful if this question could be definitively settled this admission. I will consult SW to get this done.  She needs an echocardiogram and if she is going to be in the hospital a day or 2 more that could be done as inpatient. Otherwise she is scheduled for treatment in our office 4/6, restaging studies 4/9 and 4/12, the  latter a brain MRI under anesthesia which she tells me is the only way she can get that test done.  If we cannot obtain a brain MRI I will discuss lumbar puncture with her. However, I do not believe leptomeningeal disease explains the patient's repeated episodes of confusion-- that process is generally relentless and irreversible, not waxing and waning, though of course there are always exceptions.  The patient tells me she "feels like a bird trapped in a cage" and wants to go home. I would suggest reconsultation to psychiatry re the question of competency before the patient deides to leave the hospital [facility]  Greatly appreciate your help to this complex patient! Will follow with you     Chauncey Cruel, MD 03/01/2017  7:20 AM Medical Oncology and Hematology [facility] [address] [place], [place] [address] Tel. [phone]    Fax. [phone]

## 2017-03-01 NOTE — Consult Note (Signed)
[facility] Consult   Reason for Consult:  Mental capacity/ability evaluation Referring Physician:  Dr Carolin Sicks Patient Identification: [name] MRN:  [id number] Principal Diagnosis: Confusion with acute encephalopathy Diagnosis:   Patient Active Problem List   Diagnosis Date Noted  . Confusion [R41.0] 12/03/2016    Priority: High  . Acute lower UTI [N39.0]   . Hypokalemia [E87.6] 02/18/2017  . Gait abnormality [R26.9] 01/06/2017  . Bipolar I disorder, most recent episode depressed ([place]) [F31.30] 12/08/2016  . Adjustment disorder with anxiety [F43.22] 12/06/2016  . Delirium due to another medical condition [F05] 12/03/2016  . Diarrhea [R19.7] 12/03/2016  . Metastatic breast cancer ([place]) [C50.919]   . Cerumen impaction [H61.20] 11/25/2016  . Otitis media [H66.90] 11/06/2016  . Brain metastasis ([place]) [C79.31] 07/27/2016  . Iron deficiency anemia [D50.9] 06/26/2016  . Bone metastases ([place]) [C79.51] 06/03/2016  . Primary cancer of lower-inner quadrant of left female breast ([place]) [C50.312] 06/01/2016  . Pap smear for cervical cancer screening [Z12.4] 03/28/2015  . Current smoker [F17.200] 03/28/2015  . Healthcare maintenance [Z00.00] 03/28/2015  . Seasonal allergies [J30.2] 03/28/2015  . Anxiety state [F41.1] 02/28/2015  . Fibromyalgia [M79.7] 02/28/2015  . Family history of diabetes mellitus [Z83.3] 02/28/2015  . H/O alcohol abuse [Z87.898]     Total Time spent with patient: 40 minutes  Subjective:   [name] is a 56 y.o. female patient admitted with confusion secondary to metastatic cancer. Evaluating pt at the request of [facility]. Pt seen and chart reviewed. Pt is alert/oriented to self, place, situation, and intermittent to time. She is calm, cooperative, and appropriate to situation. Pt denies suicidal/homicidal ideation and psychosis and does not appear to be responding to internal stimuli. Multiple scenarios were discussed regarding orientation and  consent. Pt is fully alert and oriented and only made one mistake regarding the exact date (by 1 day) and a little confusion about time of day. We discussed possible medical scenarios and what she would do in terms of consent for situations such as lacerations and consent to sutures, infections and antibiotics, MRI for brain metastasis, etc. Pt answered all of these questions linearly and logically. When discussing nursing and provider concerns about her asking to leave the hospital yesterday, getting up and stumbling, while demanding to take a cab, pt had good insight. Pt reported that "I was confused yesterday, I was out of it. I'm much better today. I wanted to leave because I honestly don't want to be here. I can't truly afford a cab as I don't have any money with me anyway. I don't want to leave today. I am just tired of coming to the hospital."  Pt reported that she originally declined consent to have an MRI. When asked why, pt reports that she is very claustrophobic. Pt states that "they had to knock me out last time; as long as they can do that, I'm fine with an MRI." Pt was able to state that there was a high risk to her health if she refused the MRI, and that she wanted to have it to see if there were any more metastases in her brain. Pt reports that she does indeed consent to MRI as long as she can be treated for her severe claustrophobia. She reported "please check my records for whatever med they gave me last time as it worked well and I was able to complete the MRI."   At the end of the assessment, after answering nearly all questions linearly and logically, pt reported  that she was confused about the time of day and reported that she thought her family stated it was night-time on the phone but then staff told her it was daytime. This does reflect some mild confusion, but based on the thorough assessment above, and the history of this confusion waxing/waning in the chart, this is not enough to  question her ability to make informed medical decisions at this time. Her current presentation is more consistent with organic rather than psychiatric etiology.   *Pt does not meet IVC Criteria. Pt is not psychotic, suicidal, homicidal, responding to internal stimuli, or a danger to herself or others. These are the IVC criteria. Anything aside from this does not qualify for IVC. If the pt is attempting to leave and is unsteady, you may continue the safety sitter instead. If delirious/confused, you may need to consider the hospital restraint policies and evaluate all options.   *Pt is capable of participating in informed consent. Pt does possess the mental ability to participate in informed medical decision-making.   HPI:  I have reviewed and concur with HPI elements below, modified as follows:  "Patient is 56 year old Caucasian female with history of remote alcohol abuse, bipolar disorder, cancer of the left breast with brain metastasis status post mastectomy and radiation to the brain.  Consult was called as patient is confused and change in her mental status.  She has multiple emergency room visits for confusion.  She lives with her roommate.  Patient remains very emotional, labile, poor historian.  She continues to have episodes of confusion.  She is easily tearful.  She denies any depression or suicidal thoughts but endorsed anxious, emotional and sad.  She has difficulty remembering things.  Though she wants to discharge but does not have insight into her illness.  Patient was started Seroquel 25 mg twice a day and she admitted her sleep is improved from the past.  Patient denies any hallucination, paranoia, self abusive behavior.  As per chart she has unsteady gait and occasional fall since the brain surgery.  Currently she is not seeing any psychiatrist but she remember taking medication Lamictal and gabapentin.  She did not recall the dosage.  She endorse lately feeling poor sleep and having racing  thoughts."  Pt spent the night on the medical floor without incident. Nursing staff reports that pt was very confused yesterday and demanding to leave but it somewhat better today, although still intermittently confused and sleeping most of the day. Pt seen above today on 03/01/17 for psychiatric evaluation.   Past Psychiatric History: Reviewed.  Patient is unable to provide information about her previous psychiatric history.  Risk to Self: Suicidal Ideation: No Suicidal Intent: No Is patient at risk for suicide?: No Suicidal Plan?: No Access to Means: No What has been your use of drugs/alcohol within the last 12 months?: NA How many times?: 0 Other Self Harm Risks: NA Triggers for Past Attempts: None known Intentional Self Injurious Behavior: None Risk to Others: Homicidal Ideation: No Thoughts of Harm to Others: No Current Homicidal Intent: No Current Homicidal Plan: No Access to Homicidal Means: No Identified Victim: NA History of harm to others?: No Assessment of Violence: None Noted Violent Behavior Description: NA Does patient have access to weapons?: No Criminal Charges Pending?: No Does patient have a court date: No Prior Inpatient Therapy: Prior Inpatient Therapy: Yes Prior Therapy Dates: 2017 Prior Therapy Facilty/Provider(s): [facility] Reason for Treatment: MH issues Prior Outpatient Therapy: Prior Outpatient Therapy: Yes Prior Therapy Dates: 2017  [facility] Reason for Treatment: MH issues Does patient have an ACCT team?: No Does patient have Intensive In-House Services?  : No Does patient have [facility] services? : No Does patient have P4CC services?: No  Past Medical History:  Past Medical History:  Diagnosis Date  . Alcohol abuse   . Anemia    during chemo  . Anxiety    At age 46  . Arthritis Dx 2010  . Bipolar disorder ([place])   . Cancer ([place])    breast mets to brain  . Chronic pain   . Complication of  anesthesia   . Depression   . Fibromyalgia Dx 2005  . GERD (gastroesophageal reflux disease)   . Headache    hx  migraines  . Opiate dependence ([place])   . PONV (postoperative nausea and vomiting)   . Port-a-cath in place   . PTSD (post-traumatic stress disorder)     Past Surgical History:  Procedure Laterality Date  . APPLICATION OF CRANIAL NAVIGATION N/A 08/14/2016   Procedure: APPLICATION OF CRANIAL NAVIGATION;  Surgeon: Erline Levine, MD;  Location: [facility];  Service: Neurosurgery;  Laterality: N/A;  . BREAST RECONSTRUCTION Left    with silicone implant  . CRANIOTOMY N/A 08/14/2016   Procedure: CRANIOTOMY TUMOR EXCISION WITH Lucky Rathke;  Surgeon: Erline Levine, MD;  Location: [facility];  Service: Neurosurgery;  Laterality: N/A;  . FIBULA FRACTURE SURGERY Left   . MASTECTOMY Left   . RADIOLOGY WITH ANESTHESIA N/A 07/23/2016   Procedure: MRI OF BRAIN WITH AND WITHOUT;  Surgeon: Medication Radiologist, MD;  Location: [facility];  Service: Radiology;  Laterality: N/A;  . RADIOLOGY WITH ANESTHESIA N/A 09/08/2016   Procedure: MRI OF BRAIN WITH AND WITHOUT CONTRAST;  Surgeon: Medication Radiologist, MD;  Location: [facility];  Service: Radiology;  Laterality: N/A;  . RADIOLOGY WITH ANESTHESIA N/A 12/10/2016   Procedure: MRI OF BRAIN WITH AND WITHOUT;  Surgeon: Medication Radiologist, MD;  Location: [facility];  Service: Radiology;  Laterality: N/A;  . right power port placement Right    Family History:  Family History  Problem Relation Age of Onset  . Diabetes Mother   . Bipolar disorder Mother   . CAD Father    Family Psychiatric  History: Reviewed. Social History:  History  Alcohol Use No    Comment: no ETOH since 08/22/12     History  Drug Use No    Comment: prescription opiates from the street daily until Sunday    Social History   Social History  . Marital status: Single    Spouse name: N/A  . Number of children: N/A  . Years of education: N/A   Social History Main Topics  .  Smoking status: Current Every Day Smoker    Packs/day: 0.25    Types: Cigarettes  . Smokeless tobacco: Never Used     Comment: Pt is on Chantix at present time  . Alcohol use No     Comment: no ETOH since 08/22/12  . Drug use: No     Comment: prescription opiates from the street daily until Sunday  . Sexual activity: No     Comment: ablation   Other Topics Concern  . None   Social History Narrative  . None   Additional Social History:    Allergies:   Allergies  Allergen Reactions  . Demerol Itching and Nausea And Vomiting  . Erythromycin Rash    Labs:  No results found for this or any  previous visit (from the past 48 hour(s)).  Current Facility-Administered Medications  Medication Dose Route Frequency Provider Last Rate Last Dose  . 0.9 %  sodium chloride infusion  250 mL Intravenous PRN Vianne Bulls, MD      . acetaminophen (TYLENOL) tablet 650 mg  650 mg Oral Q6H PRN Vianne Bulls, MD      . alum & mag hydroxide-simeth (MAALOX/MYLANTA) 200-200-20 MG/5ML suspension 15 mL  15 mL Oral PRN Dron Tanna Furry, MD   15 mL at 02/28/17 1855  . anastrozole (ARIMIDEX) tablet 1 mg  1 mg Oral Daily Vianne Bulls, MD   1 mg at 03/01/17 4287  . enoxaparin (LOVENOX) injection 40 mg  40 mg Subcutaneous Q24H Vianne Bulls, MD   40 mg at 02/28/17 2231  . gabapentin (NEURONTIN) capsule 600 mg  600 mg Oral TID Vianne Bulls, MD   600 mg at 03/01/17 1008  . ibuprofen (ADVIL,MOTRIN) tablet 600 mg  600 mg Oral Q6H PRN Vianne Bulls, MD   600 mg at 03/01/17 1008  . lamoTRIgine (LAMICTAL) tablet 150 mg  150 mg Oral Daily Dron Tanna Furry, MD   150 mg at 03/01/17 1008  . lithium carbonate capsule 300 mg  300 mg Oral BID WC Vianne Bulls, MD   300 mg at 03/01/17 6811  . loratadine (CLARITIN) tablet 10 mg  10 mg Oral Daily Vianne Bulls, MD   10 mg at 03/01/17 1008  . LORazepam (ATIVAN) tablet 0.5 mg  0.5 mg Oral Q6H PRN Dron Tanna Furry, MD   0.5 mg at 03/01/17 1008   Or  .  LORazepam (ATIVAN) injection 0.5 mg  0.5 mg Intramuscular Q6H PRN Dron Tanna Furry, MD   0.5 mg at 02/28/17 1030  . ondansetron (ZOFRAN) tablet 4 mg  4 mg Oral Q6H PRN Vianne Bulls, MD   4 mg at 02/28/17 0606   Or  . ondansetron (ZOFRAN) injection 4 mg  4 mg Intravenous Q6H PRN Vianne Bulls, MD   4 mg at 03/01/17 1008  . pantoprazole (PROTONIX) EC tablet 40 mg  40 mg Oral Daily Vianne Bulls, MD   40 mg at 03/01/17 1008  . potassium chloride SA (K-DUR,KLOR-CON) CR tablet 10 mEq  10 mEq Oral Daily Vianne Bulls, MD   10 mEq at 03/01/17 1008  . QUEtiapine (SEROQUEL) tablet 50 mg  50 mg Oral BID Dron Tanna Furry, MD   50 mg at 03/01/17 1008  . sodium chloride flush (NS) 0.9 % injection 3 mL  3 mL Intravenous Q12H Ilene Qua Opyd, MD   3 mL at 03/01/17 1009  . sodium chloride flush (NS) 0.9 % injection 3 mL  3 mL Intravenous PRN Vianne Bulls, MD      . traMADol Veatrice Bourbon) tablet 50 mg  50 mg Oral Q6H PRN Dron Tanna Furry, MD   50 mg at 03/01/17 5726  . traZODone (DESYREL) tablet 200 mg  200 mg Oral QHS Vianne Bulls, MD   200 mg at 02/28/17 2230    Musculoskeletal: Strength & Muscle Tone: decreased Gait & Station: Patient lying on the bed Patient leans: N/A  Psychiatric Specialty Exam: Physical Exam  Review of Systems  Psychiatric/Behavioral: Positive for depression. Negative for hallucinations, substance abuse and suicidal ideas. The patient is not nervous/anxious and does not have insomnia.   All other systems reviewed and are negative.   Blood pressure 97/62, pulse 87, temperature 98.8 F (  37.1 C), temperature source Oral, resp. rate 20, SpO2 97 %.There is no height or weight on file to calculate BMI.  General Appearance: Casual and Fairly Groomed  Eye Contact:  Good  Speech:  Slow  Volume:  Decreased  Mood:  Depressed about medical conditions  Affect:  Appropriate, Congruent and Depressed  Thought Process:  Irrelevant  Orientation:  Person, place, situation,  intermittent to time  Thought Content:  Focused on fear of claustrophobia with MRI, but wants the MRI  Suicidal Thoughts:  No  Homicidal Thoughts:  No  Memory:  Immediate;   Poor Recent;   Fair Remote;   Poor  Judgement:  Impaired  Insight:  Lacking  Psychomotor Activity:  Normal  Concentration:  Concentration: Poor and Attention Span: Poor  Recall:  Poor  Fund of Knowledge:  Fair  Language:  Fair  Akathisia:  No  Handed:  Right  AIMS (if indicated):     Assets:  Desire for Improvement  ADL's:  Intact  Cognition:  Impaired,  Moderate  Sleep:      Treatment Plan Summary: *Pt does not meet IVC Criteria. Pt is not psychotic, suicidal, homicidal, responding to internal stimuli, or a danger to herself or others. These are the IVC criteria. Anything aside from this does not qualify for IVC. If the pt is attempting to leave and is unsteady, you may continue the safety sitter instead. If her delirium/confusion returns, you may need to consider the hospital restraint policies and evaluate all options.   *Pt does possess the mental ability to participate in informed medical decision-making.   Medications: -Continue Seroquel 50 mg po bid for mood stabilization, trazodone 200 mg at bedtime .  Patient also taking Lamictal 150 mg and currently she has no side effects. Continue Lithium 300mg  po bid wc (therapeutic on 3/29 at 0.85).   Patient does not meet criteria for inpatient psychiatric treatment.  Please call social worker for appropriate discharge planning.   Disposition: No evidence of imminent risk to self or others at present.   Patient does not meet criteria for psychiatric inpatient admission. Supportive therapy provided about ongoing stressors.  Benjamine Mola, FNP 03/01/2017 10:54 AM

## 2017-03-01 NOTE — Progress Notes (Signed)
Occupational Therapy Treatment Patient Details Name: [name] MRN: [id number] DOB: Jul 31, 1961 Today's Date: 03/01/2017    History of present illness Pt is a 56 y.o. female who presented to the ED with confusion. She has a medical history significant for remote alcohol abuse, bipolar disorder, cancer of the left breast with brain metastases status post left mastectomy and axillary node dissection with craniectomy and radiation to the brain and breast. Since the patient's craniectomy in September 2017, she has had multiple ED visits and an admission to the hospital for confusion. This has seemed to wax and wane, and it is not clear if she completely normalizes between the episodes.   OT comments  Upon entering the room, pt seated in chair with sitter present in the room. Pt with no c/o pain this session. Pt engaged in functional tasks of picking up dirty linen from floor and placing into linen bag. Pt reports she is responsible for her laundry at home. Pt ambulates with SBA and without use of AD. Pt requires steady assistance when bending forward to pick up items from floor. Steady assistance needed when holding items in arms and carrying to linen bag secondary to decreased dynamic balance. Pt standing and sink and washing hands after task. Pt oriented to location, time, and self but not situation. Pt requiring mod verbal cues for redirection to tasks. Pt seated on EOB at end of session with all needs within reach.    Follow Up Recommendations  Home health OT;Supervision/Assistance - 24 hour    Equipment Recommendations  3 in 1 bedside commode;Tub/shower seat    Recommendations for Other Services      Precautions / Restrictions Precautions Precautions: Fall Precaution Comments: Impulsive and unsteady Restrictions Weight Bearing Restrictions: No       Mobility Bed Mobility Overal bed mobility: Needs Assistance Bed Mobility: Supine to Sit;Sit to Supine     Supine to sit: Min  guard Sit to supine: Min guard      Transfers Overall transfer level: Needs assistance Equipment used: None Transfers: Sit to/from Stand Sit to Stand: Min guard              Balance Overall balance assessment: Needs assistance Sitting-balance support: No upper extremity supported;Feet supported Sitting balance-Leahy Scale: Good     Standing balance support: No upper extremity supported;During functional activity   Standing balance comment: Min guard assist for stability.                           ADL either performed or assessed with clinical judgement   ADL Overall ADL's : Needs assistance/impaired     Grooming: Wash/dry hands;Standing;Supervision/safety                                       Vision Wears Glasses: At all times     Perception     Praxis      Cognition Arousal/Alertness: Awake/alert Behavior During Therapy: Impulsive Overall Cognitive Status: No family/caregiver present to determine baseline cognitive functioning Area of Impairment: Orientation;Memory;Following commands;Safety/judgement;Awareness;Problem solving;Attention                 Orientation Level: Disoriented to;Situation Current Attention Level: Selective Memory: Decreased short-term memory Following Commands: Follows one step commands inconsistently Safety/Judgement: Decreased awareness of safety;Decreased awareness of deficits Awareness: Intellectual Problem Solving: Slow processing;Decreased initiation;Difficulty sequencing;Requires verbal cues  Exercises     Shoulder Instructions       General Comments      Pertinent Vitals/ Pain       Pain Assessment: No/denies pain  Home Living                                          Prior Functioning/Environment              Frequency  Min 1X/week        Progress Toward Goals  OT Goals(current goals can now be found in the care plan section)  Progress  towards OT goals: Progressing toward goals  Acute Rehab OT Goals OT Goal Formulation: With patient Time For Goal Achievement: 03/15/17 Potential to Achieve Goals: Good  Plan Discharge plan remains appropriate    Co-evaluation                 End of Session    OT Visit Diagnosis: Muscle weakness (generalized) (M62.81);Other abnormalities of gait and mobility (R26.89)   Activity Tolerance Patient tolerated treatment well   Patient Left in chair;with nursing/sitter in room   Nurse Communication Mobility status        Time: 3552-1747 OT Time Calculation (min): 18 min  Charges: OT General Charges $OT Visit: 1 Procedure OT Treatments $Self Care/Home Management : 8-22 mins    Gypsy Decant 03/01/2017, 3:23 PM

## 2017-03-01 NOTE — Progress Notes (Signed)
CSW consulted concerning who is responsible for making medical decisions for patient.  Per MD note patient had reported her sister, [name], is her 54.  CSW reached out to sister who reports she does not have any HCPOA paperwork for patient but states the family as a whole are making decisions for patient at this time.    Legally patients sons, [name] and [name], would have decision making as next of kin but sister is a Therapist, sports and family has been looking to her as main point of contact for patient.    Sister lives in [place] but is active in patient care- states she has been taking care of patient her whole life despite being the younger sibling.  Sister states pt psych issues began in late teens and patient has had chronic issues with alcohol and drug abuse with multiple rehab stays and inpatient psych admissions.  Sister states pt has been sober for 5 years but is currently staying in a recovery house (facesheet address is correct.  Sister is concerned about pt having consistent psych meds- states that since she has had multiple hospital admissions since January MDs have started and stopped her psych medications which sister feels like is contributing to her condition.  Sister will help find name of pt current psychiatrist and provide to [name].  Jorge Ny, LCSW Clinical Social Worker [phone]

## 2017-03-01 NOTE — Progress Notes (Signed)
PROGRESS NOTE    [name]  PPI:[id number] DOB: 06/22/1961 DOA: 02/25/2017 PCP: Arnoldo Morale, MD   Brief Narrative: 56 y.o. female with medical history significant for remote alcohol abuse, bipolar disorder, cancer of the left breast with brain metastases status post left mastectomy and axillary node dissection with craniectomy and radiation to the brain and breast, now presenting to the emergency department with several days of confusion. Since the patient's craniectomy in September 2017, she has had multiple ED visits and an admission to the hospital for confusion. This has seemed to wax and wane, and it is not clear if she completely normalizes between the episodes. She is now brought in after her roommate raises concern for several days of confusion, but is not available at time of admission to assist with additional history. Patient has apparently been intermittently confused, but has not voiced any specific complaints. Patient denies any pain, dyspnea, nausea, vomiting, or diarrhea. She acknowledges her confusion and reports that it began after her brain surgery in September 2017. She also reports an unsteady gait with occasional falls since her brain surgery, but denies any significant injury related to these.  In ED: CT is negative for acute intracranial abnormality.Psychiatry was consulted by the ED physician and feels that the current presentation is not psychiatric.  Assessment & Plan:  #Acute encephalopathy of unknown etiology: -Reportedly patient has intermittent confusion since brain surgery. Head CT with no acute finding. Patient denied any symptoms and wanted to go home today. As per prior note, psychiatry was consulted from the ER. Patient currently has bed sitter for safety. Reportedly patient had multiple admissions for the confusion with no known diagnosis. Unknown if her confusion is related with brain metastases and post surgical effect. -MRI of brain ordered, required  anesthesia likely on Monday as per radiology. Needs to get consent from patient's family member. -Patient had workup including TSH, vitamin B12,  ammonia, RPR, HIV antibody unremarkable test result in January 2018. -I discussed with the patient's sister in detail on 02/26/2017 and 02/27/2017. She reported this issue has been going on and was frustrated that there is no confirmed diagnosis.   The patient was more calm comfortable and reasonable today. She told me that she had discussion with oncologist today and she agreed to get MRI with anesthesia. As per hospitalist staffs, the MRI is is scheduled for tomorrow morning. Noticed that echocardiogram was ordered by oncologist. If MRI showed no acute finding patient will likely be discharged. I discussed with the case manager today. Patient wants to go home and does not want to go to rehabilitation on discharge. Since her mental status is better today I really consulted psychiatrist for reevaluation of her capacity. Patient was started on  Seroquel twice a day and the dose of Lamictal increased as per psychiatrist recommendation.  # Anxiety and history of bipolar disorder:  -Continue Lamictal, Neurontin, lithium, trazodone, seroquel. May need to adjust the dose of Seroquel. Continue supportive care and bed sitter. -Continue supportive care  #Metastatic breast cancer: Status post left mastectomy and brain surgery. Recommended to follow-up with oncologist outpatient. MRI brain ordered to further evaluate. Continue Arimidex.  #UTI, site unspecified: Urine culture has no growth. I'll discontinue antibiotics.   Principal Problem:   Acute encephalopathy Active Problems:   H/O alcohol abuse   Metastatic breast cancer ([place])   Bipolar I disorder, most recent episode depressed ([place])   Acute lower UTI  DVT prophylaxis: Lovenox subcutaneous Code Status: Full code Family Communication: Discussed with  the patient's sister on 3/30 and 3/31 Disposition Plan:  Unknown at this time, likely rehabilitation in 1-2 days    Consultants:   Psychiatrist  Procedures: None Antimicrobials: Ceftriaxone on 3/30  Subjective: Patient was seen and examined at bedside. She was more calm comfortable and less confused today. Continue bed sitter. Denies headache, dizziness, nausea, vomiting. Agreed for MRI and then discharge home.  Objective: Vitals:   02/28/17 1617 02/28/17 1651 02/28/17 2240 03/01/17 0600  BP: (!) 85/56 100/60 112/61 97/62  Pulse: 89  87   Resp: 17  20 20   Temp: 98 F (36.7 C)  98.1 F (36.7 C) 98.8 F (37.1 C)  TempSrc: Oral  Oral Oral  SpO2: 97%  97%    No intake or output data in the 24 hours ending 03/01/17 1156 There were no vitals filed for this visit.  Examination:  General exam: Not in distress Respiratory system: Clear bilateral. Respiratory effort normal. No wheezing or crackle Cardiovascular system: Regular rate rhythm, S1-S2 normal.  No pedal edema. Gastrointestinal system: Abdomen is nondistended, soft and nontender. Normal bowel sounds heard. Central nervous system: Alert and oriented. No focal neurological deficits. Extremities: Symmetric 5 x 5 power. Skin: No rashes, lesions or ulcers Psychiatry: Judgement and insight appear to be better today     Data Reviewed: I have personally reviewed following labs and imaging studies  CBC:  Recent Labs Lab 02/25/17 1134  WBC 4.7  HGB 12.5  HCT 39.3  MCV 97.3  PLT 001   Basic Metabolic Panel:  Recent Labs Lab 02/25/17 1134  NA 140  K 3.8  CL 105  CO2 28  GLUCOSE 109*  BUN <5*  CREATININE 0.81  CALCIUM 10.2   GFR: Estimated Creatinine Clearance: 70.6 mL/min (by C-G formula based on SCr of 0.81 mg/dL). Liver Function Tests:  Recent Labs Lab 02/25/17 1134  AST 22  ALT 15  ALKPHOS 39  BILITOT 0.6  PROT 6.7  ALBUMIN 4.3   No results for input(s): LIPASE, AMYLASE in the last 168 hours. No results for input(s): AMMONIA in the last 168  hours. Coagulation Profile: No results for input(s): INR, PROTIME in the last 168 hours. Cardiac Enzymes: No results for input(s): CKTOTAL, CKMB, CKMBINDEX, TROPONINI in the last 168 hours. BNP (last 3 results) No results for input(s): PROBNP in the last 8760 hours. HbA1C: No results for input(s): HGBA1C in the last 72 hours. CBG:  Recent Labs Lab 02/25/17 1754  GLUCAP 85   Lipid Profile: No results for input(s): CHOL, HDL, LDLCALC, TRIG, CHOLHDL, LDLDIRECT in the last 72 hours. Thyroid Function Tests: No results for input(s): TSH, T4TOTAL, FREET4, T3FREE, THYROIDAB in the last 72 hours. Anemia Panel: No results for input(s): VITAMINB12, FOLATE, FERRITIN, TIBC, IRON, RETICCTPCT in the last 72 hours. Sepsis Labs:  Recent Labs Lab 02/25/17 1814  LATICACIDVEN 0.76    Recent Results (from the past 240 hour(s))  Urine culture     Status: None   Collection Time: 02/25/17  6:06 PM  Result Value Ref Range Status   Specimen Description URINE, CLEAN CATCH  Final   Special Requests NONE  Final   Culture NO GROWTH  Final   Report Status 02/26/2017 FINAL  Final         Radiology Studies: No results found.      Scheduled Meds: . anastrozole  1 mg Oral Daily  . enoxaparin (LOVENOX) injection  40 mg Subcutaneous Q24H  . gabapentin  600 mg Oral TID  . lamoTRIgine  150 mg Oral Daily  . lithium carbonate  300 mg Oral BID WC  . loratadine  10 mg Oral Daily  . pantoprazole  40 mg Oral Daily  . potassium chloride  10 mEq Oral Daily  . QUEtiapine  50 mg Oral BID  . sodium chloride flush  3 mL Intravenous Q12H  . traZODone  200 mg Oral QHS   Continuous Infusions:    LOS: 3 days    Kassidie Hendriks Tanna Furry, MD [facility] Pager [phone]  If 7PM-7AM, please contact night-coverage [email or web address] Password [email or web address] 03/01/2017, 11:56 AM

## 2017-03-02 ENCOUNTER — Encounter (HOSPITAL_COMMUNITY): Payer: Self-pay | Admitting: Certified Registered"

## 2017-03-02 ENCOUNTER — Inpatient Hospital Stay (HOSPITAL_COMMUNITY): Payer: Medicaid Other

## 2017-03-02 ENCOUNTER — Inpatient Hospital Stay (HOSPITAL_COMMUNITY): Payer: Medicaid Other | Admitting: Certified Registered"

## 2017-03-02 ENCOUNTER — Encounter (HOSPITAL_COMMUNITY)
Admission: EM | Disposition: A | Discharge status: Home Health | Payer: Self-pay | Source: Home / Self Care | Attending: Nephrology

## 2017-03-02 ENCOUNTER — Telehealth: Payer: Self-pay

## 2017-03-02 DIAGNOSIS — R410 Disorientation, unspecified: Secondary | ICD-10-CM

## 2017-03-02 DIAGNOSIS — Z01810 Encounter for preprocedural cardiovascular examination: Secondary | ICD-10-CM

## 2017-03-02 HISTORY — PX: RADIOLOGY WITH ANESTHESIA: SHX6223

## 2017-03-02 SURGERY — RADIOLOGY WITH ANESTHESIA
Anesthesia: General

## 2017-03-02 MED ORDER — MIDAZOLAM HCL 5 MG/5ML IJ SOLN
INTRAMUSCULAR | Status: DC | PRN
  Administered 2017-03-02: 2 mg via INTRAVENOUS

## 2017-03-02 MED ORDER — PROPOFOL 10 MG/ML IV BOLUS
INTRAVENOUS | Status: DC | PRN
  Administered 2017-03-02: 140 mg via INTRAVENOUS

## 2017-03-02 MED ORDER — GADOBENATE DIMEGLUMINE 529 MG/ML IV SOLN
15.0000 mL | Freq: Once | INTRAVENOUS | Status: AC
  Administered 2017-03-02: 13 mL via INTRAVENOUS

## 2017-03-02 MED ORDER — LIDOCAINE HCL (CARDIAC) 20 MG/ML IV SOLN
INTRAVENOUS | Status: DC | PRN
  Administered 2017-03-02: 30 mg via INTRAVENOUS

## 2017-03-02 MED ORDER — ONDANSETRON HCL 4 MG/2ML IJ SOLN
INTRAMUSCULAR | Status: DC | PRN
  Administered 2017-03-02: 4 mg via INTRAVENOUS

## 2017-03-02 MED ORDER — FENTANYL CITRATE (PF) 100 MCG/2ML IJ SOLN
INTRAMUSCULAR | Status: DC | PRN
  Administered 2017-03-02: 75 ug via INTRAVENOUS

## 2017-03-02 MED ORDER — PHENOL 1.4 % MT LIQD
1.0000 | OROMUCOSAL | Status: DC | PRN
  Administered 2017-03-02: 1 via OROMUCOSAL
  Filled 2017-03-02: qty 177

## 2017-03-02 MED ORDER — LACTATED RINGERS IV SOLN
INTRAVENOUS | Status: DC | PRN
  Administered 2017-03-02: 15:00:00 via INTRAVENOUS

## 2017-03-02 NOTE — Transfer of Care (Signed)
Immediate Anesthesia Transfer of Care Note  Patient: [name]  Procedure(s) Performed: Procedure(s): MRI of BRAIN W and W/OUT CONTRAST (N/A)  Patient Location: PACU  Anesthesia Type:General  Level of Consciousness: awake, alert  and oriented  Airway & Oxygen Therapy: Patient Spontanous Breathing and Patient connected to nasal cannula oxygen  Post-op Assessment: Report given to RN  Post vital signs: Reviewed and stable  Last Vitals:  Vitals:   03/02/17 1423 03/02/17 1645  BP: 111/81 (!) 117/58  Pulse: (!) 106 99  Resp: 18 13  Temp: 36.6 C 36.5 C    Last Pain:  Vitals:   03/02/17 0551  TempSrc: Oral  PainSc:       Patients Stated Pain Goal: 2 (59/47/07 6151)  Complications: No apparent anesthesia complications

## 2017-03-02 NOTE — Anesthesia Postprocedure Evaluation (Addendum)
Anesthesia Post Note  Patient: [name]  Procedure(s) Performed: Procedure(s) (LRB): MRI of BRAIN W and W/OUT CONTRAST (N/A)  Patient location during evaluation: PACU Anesthesia Type: General Level of consciousness: awake, awake and alert and oriented Pain management: pain level controlled Vital Signs Assessment: post-procedure vital signs reviewed and stable Respiratory status: spontaneous breathing, nonlabored ventilation and respiratory function stable Cardiovascular status: blood pressure returned to baseline Anesthetic complications: no       Last Vitals:  Vitals:   03/02/17 1645 03/02/17 1715  BP: (!) 117/58   Pulse: 99   Resp: 13   Temp: 36.5 C 36.4 C    Last Pain:  Vitals:   03/02/17 0551  TempSrc: Oral  PainSc:                  Efstathios Sawin COKER

## 2017-03-02 NOTE — Progress Notes (Signed)
RN reported that she received a call from MRI stating that patient needs cardiology clearance before MRI can be done.   I reviewed latest EKG which is SR, non-specific T wave change.  Pt has no chest pain, SOB, headache or dizziness. I think patient is clinically stable and can proceed with MRI.  Cardiology consulted  Echo pending.

## 2017-03-02 NOTE — Progress Notes (Signed)
Per Dr. Paulina Fusi, echo needs to be completed prior to MRI with anesthesia due to changes in EKG.  Received verbal order from Dr. Paulina Fusi to change echo to stat.

## 2017-03-02 NOTE — Progress Notes (Signed)
COURTESY NOTE: Very helpful notes fromSW, psychiatr and hospitalist yesterday reviewed. It appears pt now agrees to MRI under sedation and this is being scheduled.  The reason for an echo of course is the patient's receiving anti-HER-2 immunotherapy-- last echo was in December 2017; we normally repeat these every 3 months and patient as you are aware is hard to schedule.  Will follow with you--plan to see patient early tomorrow AM after breast cancer conference

## 2017-03-02 NOTE — Telephone Encounter (Signed)
Writer called patient and discussed lab results per Dr. Jarold Song.  Patient stated understanding.

## 2017-03-02 NOTE — Progress Notes (Addendum)
PROGRESS NOTE    [name]  KJZ:[id number] DOB: 02/17/1961 DOA: 02/25/2017 PCP: Arnoldo Morale, MD   Brief Narrative: 56 y.o. female with medical history significant for remote alcohol abuse, bipolar disorder, cancer of the left breast with brain metastases status post left mastectomy and axillary node dissection with craniectomy and radiation to the brain and breast, now presenting to the emergency department with several days of confusion. Since the patient's craniectomy in September 2017, she has had multiple ED visits and an admission to the hospital for confusion. This has seemed to wax and wane, and it is not clear if she completely normalizes between the episodes. She is now brought in after her roommate raises concern for several days of confusion, but is not available at time of admission to assist with additional history. Patient has apparently been intermittently confused, but has not voiced any specific complaints. Patient denies any pain, dyspnea, nausea, vomiting, or diarrhea. She acknowledges her confusion and reports that it began after her brain surgery in September 2017. She also reports an unsteady gait with occasional falls since her brain surgery, but denies any significant injury related to these.  In ED: CT is negative for acute intracranial abnormality.Psychiatry was consulted by the ED physician and feels that the current presentation is not psychiatric.  Assessment & Plan:  #Acute encephalopathy of unknown etiology: -Reportedly patient has intermittent confusion since brain surgery. Head CT with no acute finding. Patient denied any symptoms and wanted to go home today. As per prior note, psychiatry was consulted from the ER. Patient currently has bed sitter for safety. Reportedly patient had multiple admissions for the confusion with no known diagnosis. Unknown if her confusion is related with brain metastases and post surgical effect. -MRI of brain ordered, required  anesthesia likely on Monday as per radiology. Needs to get consent from patient's family member. -Patient had workup including TSH, vitamin B12,  ammonia, RPR, HIV antibody unremarkable test result in January 2018. -I discussed with the patient's sister in detail on 02/26/2017, 02/27/2017 and 03/02/2017. She reported this issue has been going on and was frustrated that there is no confirmed diagnosis.   Patient was alert awake and comfortable this morning. Psychiatric consult appreciated. Patient understand the current situation and agreed for MRI and echocardiogram. She now has a capacity to make clinical decision. She will no longer require IVC. I discussed this with social worker since the IVC order was in the paper chart. We will continue current medical and supportive care. Discussed with the case manager regarding possible discharge home with home care services.  # Anxiety and history of bipolar disorder:  -Continue Lamictal, Neurontin, lithium, trazodone, seroquel.  Continue supportive care and bed sitter. The dose of trazodone increased to 200 mg by psychiatrist. -Continue supportive care  #Metastatic breast cancer: Status post left mastectomy and brain surgery. Recommended to follow-up with oncologist outpatient. MRI brain ordered to further evaluate. Continue Arimidex. -Echocardiogram ordered by oncologist.  #UTI, site unspecified: Urine culture has no growth. I'll discontinue antibiotics.   Principal Problem:   Confusion Active Problems:   H/O alcohol abuse   Brain metastasis ([place])   Delirium due to another medical condition   Metastatic breast cancer ([place])   Bipolar I disorder, most recent episode depressed ([place])   Acute lower UTI  DVT prophylaxis: Lovenox subcutaneous Code Status: Full code Family Communication: Discussed with the patient's sister today at length. I stated her that patient may be discharged tomorrow. Disposition Plan: Likely home tomorrow with  home care  services    Consultants:   Psychiatrist  Procedures: None Antimicrobials: Ceftriaxone on 3/30  Subjective: Patient was seen and examined at bedside. She was alert awake and calm. Denied headache, dizziness, nausea, vomiting. Agreed for MRI and she is asking if she can go home tomorrow. She declined rehabilitation.  Objective: Vitals:   03/01/17 0600 03/01/17 1625 03/01/17 2057 03/02/17 0551  BP: 97/62 (!) 107/35 96/69 (!) 103/48  Pulse:  91 86 79  Resp: 20 18  17   Temp: 98.8 F (37.1 C) 97.8 F (36.6 C) 98.4 F (36.9 C) 98.9 F (37.2 C)  TempSrc: Oral Oral Oral Oral  SpO2:  99% 100% 100%   No intake or output data in the 24 hours ending 03/02/17 1139 There were no vitals filed for this visit.  Examination:  General exam: Sitting on bed, not in distress Respiratory system: Clear bilateral. Respiratory effort normal. No wheezing or crackle Cardiovascular system: Regular rate rhythm, S1-S2 normal.  No pedal edema. Gastrointestinal system: Abdomen is nondistended, soft and nontender. Normal bowel sounds heard. Central nervous system: Alert awake and oriented. No focal neurological deficits. Extremities: Symmetric 5 x 5 power. Skin: No rashes, lesions or ulcers Psychiatry: Judgement and insight appear normal    Data Reviewed: I have personally reviewed following labs and imaging studies  CBC:  Recent Labs Lab 02/25/17 1134  WBC 4.7  HGB 12.5  HCT 39.3  MCV 97.3  PLT 620   Basic Metabolic Panel:  Recent Labs Lab 02/25/17 1134  NA 140  K 3.8  CL 105  CO2 28  GLUCOSE 109*  BUN <5*  CREATININE 0.81  CALCIUM 10.2   GFR: Estimated Creatinine Clearance: 70.6 mL/min (by C-G formula based on SCr of 0.81 mg/dL). Liver Function Tests:  Recent Labs Lab 02/25/17 1134  AST 22  ALT 15  ALKPHOS 39  BILITOT 0.6  PROT 6.7  ALBUMIN 4.3   No results for input(s): LIPASE, AMYLASE in the last 168 hours. No results for input(s): AMMONIA in the last 168  hours. Coagulation Profile: No results for input(s): INR, PROTIME in the last 168 hours. Cardiac Enzymes: No results for input(s): CKTOTAL, CKMB, CKMBINDEX, TROPONINI in the last 168 hours. BNP (last 3 results) No results for input(s): PROBNP in the last 8760 hours. HbA1C: No results for input(s): HGBA1C in the last 72 hours. CBG:  Recent Labs Lab 02/25/17 1754  GLUCAP 85   Lipid Profile: No results for input(s): CHOL, HDL, LDLCALC, TRIG, CHOLHDL, LDLDIRECT in the last 72 hours. Thyroid Function Tests: No results for input(s): TSH, T4TOTAL, FREET4, T3FREE, THYROIDAB in the last 72 hours. Anemia Panel: No results for input(s): VITAMINB12, FOLATE, FERRITIN, TIBC, IRON, RETICCTPCT in the last 72 hours. Sepsis Labs:  Recent Labs Lab 02/25/17 1814  LATICACIDVEN 0.76    Recent Results (from the past 240 hour(s))  Urine culture     Status: None   Collection Time: 02/25/17  6:06 PM  Result Value Ref Range Status   Specimen Description URINE, CLEAN CATCH  Final   Special Requests NONE  Final   Culture NO GROWTH  Final   Report Status 02/26/2017 FINAL  Final         Radiology Studies: No results found.      Scheduled Meds: . anastrozole  1 mg Oral Daily  . enoxaparin (LOVENOX) injection  40 mg Subcutaneous Q24H  . gabapentin  600 mg Oral TID  . lamoTRIgine  150 mg Oral Daily  . lithium  carbonate  300 mg Oral BID WC  . loratadine  10 mg Oral Daily  . pantoprazole  40 mg Oral Daily  . potassium chloride  10 mEq Oral Daily  . QUEtiapine  50 mg Oral BID  . sodium chloride flush  3 mL Intravenous Q12H  . traZODone  200 mg Oral QHS   Continuous Infusions:    LOS: 4 days    Meshulem Onorato Tanna Furry, MD [facility] Pager [phone]  If 7PM-7AM, please contact night-coverage [email or web address] Password [facility] 03/02/2017, 11:39 AM

## 2017-03-02 NOTE — Progress Notes (Signed)
IVC paperwork has been rescinded and faxed to [place], [place] Social Worker [phone]

## 2017-03-02 NOTE — Anesthesia Preprocedure Evaluation (Addendum)
Anesthesia Evaluation  Patient identified by MRN, date of birth, ID band Patient awake    Reviewed: Allergy & Precautions, NPO status , Patient's Chart, lab work & pertinent test results  History of Anesthesia Complications (+) PONV and history of anesthetic complications  Airway Mallampati: II  TM Distance: >3 FB Neck ROM: Full    Dental  (+) Teeth Intact, Dental Advisory Given   Pulmonary Current Smoker,    breath sounds clear to auscultation       Cardiovascular  Rhythm:Regular Rate:Normal     Neuro/Psych  Headaches, PSYCHIATRIC DISORDERS Anxiety Depression Bipolar Disorder Breast Ca with metastses s/p surgery and now on immunotherapy.    GI/Hepatic GERD  ,  Endo/Other    Renal/GU      Musculoskeletal  (+) Arthritis , Fibromyalgia -  Abdominal   Peds  Hematology  (+) anemia ,   Anesthesia Other Findings   Reproductive/Obstetrics                          Anesthesia Physical Anesthesia Plan  ASA: III  Anesthesia Plan: General   Post-op Pain Management:    Induction: Intravenous  Airway Management Planned: LMA  Additional Equipment:   Intra-op Plan:   Post-operative Plan: Extubation in OR  Informed Consent:   Plan Discussed with: CRNA  Anesthesia Plan Comments: (Patient had MRI in 11/2016 under [place] Confused EKG changes - cardiology to see)      Anesthesia Quick Evaluation

## 2017-03-02 NOTE — Progress Notes (Signed)
Pt refused to finish echo this morning. Pt said she would try again after her MRI this afternoon

## 2017-03-02 NOTE — Anesthesia Procedure Notes (Signed)
Procedure Name: LMA Insertion Date/Time: 03/02/2017 3:42 PM Performed by: Sampson Si E Pre-anesthesia Checklist: Patient identified, Emergency Drugs available, Suction available and Patient being monitored Patient Re-evaluated:Patient Re-evaluated prior to inductionOxygen Delivery Method: Circle System Utilized Preoxygenation: Pre-oxygenation with 100% oxygen Intubation Type: IV induction Ventilation: Mask ventilation without difficulty LMA: LMA inserted LMA Size: 4.0 Number of attempts: 1 Placement Confirmation: positive ETCO2 Tube secured with: Tape Dental Injury: Teeth and Oropharynx as per pre-operative assessment

## 2017-03-02 NOTE — Consult Note (Signed)
Cardiology Consult    Patient ID: [name] MRN: [id number], DOB/AGE: 1961/11/04   Admit date: 02/25/2017 Date of Consult: 03/02/2017  Primary Physician: Arnoldo Morale, MD Reason for Consult: Cardiac clearance for MRI with anesthesia Primary Cardiologist: Dr. Haroldine Laws in cardio-oncology clinic Requesting Provider: Dr. Carolin Sicks  History of Present Illness    [name] is a 56 y.o. female who is being seen today for cardiac clearance for MRI with anesthesia at the request of Dr. Carolin Sicks. The patient has a past medical history significant for remote alcohol abuse, bipolar disorder, cancer of the left breast with brain metastases status post left mastectomy and axillary node dissection with craniectomy and radiation to the brain and breast who presented to the [facility] ED on 02/25/17 with several days of confusion.   She has been intermittently confused since her craniectomy in 07/2016, having had multiple ED visits. She was admitted in an attempt to evaluate the cause of her confusion. She was ordered an echo by oncology after which she was to have an MRI with anesthesia. She stopped the echo because the pressure of the probe on her chest was causing discomfort at her previous mastectomy site. MRI would not proceed without the echo or cardiology clearance.   Currently the patient is tearful and wants to drink something. She denies any exertional chest pain, dyspnea, orthopnea, edema or palpitations.   Past Medical History   Past Medical History:  Diagnosis Date  . Alcohol abuse   . Anemia    during chemo  . Anxiety    At age 72  . Arthritis Dx 2010  . Bipolar disorder ([place])   . Cancer ([place])    breast mets to brain  . Chronic pain   . Complication of anesthesia   . Depression   . Fibromyalgia Dx 2005  . GERD (gastroesophageal reflux disease)   . Headache    hx  migraines  . Opiate dependence ([place])   . PONV (postoperative nausea and vomiting)   . Port-a-cath in place     . PTSD (post-traumatic stress disorder)     Past Surgical History:  Procedure Laterality Date  . APPLICATION OF CRANIAL NAVIGATION N/A 08/14/2016   Procedure: APPLICATION OF CRANIAL NAVIGATION;  Surgeon: Erline Levine, MD;  Location: [place] NEUR[facility] ORS;  Service: Neurosurgery;  Laterality: N/A;  . BREAST RECONSTRUCTION Left    with silicone implant  . CRANIOTOMY N/A 08/14/2016   Procedure: CRANIOTOMY TUMOR EXCISION WITH Lucky Rathke;  Surgeon: Erline Levine, MD;  Location: [place] NEURO ORS;  Service: Neurosurgery;  Laterality: N/A;  . FIBULA FRACTURE SURGERY Left   . MASTECTOMY Left   . RADIOLOGY WITH ANESTHESIA N/A 07/23/2016   Procedure: MRI OF BRAIN WITH AND WITHOUT;  Surgeon: Medication Radiologist, MD;  Location: [place];  Service: Radiology;  Laterality: N/A;  . RADIOLOGY WITH ANESTHESIA N/A 09/08/2016   Procedure: MRI OF BRAIN WITH AND WITHOUT CONTRAST;  Surgeon: Medication Radiologist, MD;  Location: [place];  Service: Radiology;  Laterality: N/A;  . RADIOLOGY WITH ANESTHESIA N/A 12/10/2016   Procedure: MRI OF BRAIN WITH AND WITHOUT;  Surgeon: Medication Radiologist, MD;  Location: [place];  Service: Radiology;  Laterality: N/A;  . right power port placement Right      Allergies  Allergies  Allergen Reactions  . Demerol Itching and Nausea And Vomiting  . Erythromycin Rash    Inpatient Medications    . anastrozole  1 mg Oral Daily  . enoxaparin (LOVENOX) injection  40 mg Subcutaneous  Q24H  . gabapentin  600 mg Oral TID  . lamoTRIgine  150 mg Oral Daily  . lithium carbonate  300 mg Oral BID WC  . loratadine  10 mg Oral Daily  . pantoprazole  40 mg Oral Daily  . potassium chloride  10 mEq Oral Daily  . QUEtiapine  50 mg Oral BID  . sodium chloride flush  3 mL Intravenous Q12H  . traZODone  200 mg Oral QHS    Family History    Family History  Problem Relation Age of Onset  . Diabetes Mother   . Bipolar disorder Mother   . CAD Father      Social History    Social History   Social  History  . Marital status: Single    Spouse name: N/A  . Number of children: N/A  . Years of education: N/A   Occupational History  . Not on file.   Social History Main Topics  . Smoking status: Current Every Day Smoker    Packs/day: 0.25    Types: Cigarettes  . Smokeless tobacco: Never Used     Comment: Pt is on Chantix at present time  . Alcohol use No     Comment: no ETOH since 08/22/12  . Drug use: No     Comment: prescription opiates from the street daily until Sunday  . Sexual activity: No     Comment: ablation   Other Topics Concern  . Not on file   Social History Narrative  . No narrative on file     Review of Systems   General:  No chills, fever, night sweats or weight changes.  Cardiovascular:  No chest pain, dyspnea on exertion, edema, orthopnea, palpitations, paroxysmal nocturnal dyspnea. Dermatological: No rash, lesions/masses Respiratory: No cough, dyspnea Urologic: No hematuria, dysuria Abdominal:   No nausea, vomiting, diarrhea, bright red blood per rectum, melena, or hematemesis Neurologic:  No visual changes, wkns, changes in mental status. All other systems reviewed and are otherwise negative except as noted above.  Physical Exam   Blood pressure (!) 103/48, pulse 79, temperature 98.9 F (37.2 C), temperature source Oral, resp. rate 17, SpO2 100 %.  General: Pleasant, NAD Psych: Normal affect. Neuro: Alert and oriented X 3. Moves all extremities spontaneously. HEENT: Normal  Neck: Supple without bruits or JVD. Lungs:  Resp regular and unlabored, CTA. Heart: RRR no s3, s4, or murmurs. Abdomen: Soft, non-tender, non-distended, BS + x 4.  Extremities: No clubbing, cyanosis or edema. DP/PT/Radials 2+ and equal bilaterally.  Labs    Troponin (Point of Care Test) No results for input(s): TROPIPOC in the last 72 hours. No results for input(s): CKTOTAL, CKMB, TROPONINI in the last 72 hours. Lab Results  Component Value Date   WBC 4.7 02/25/2017     HGB 12.5 02/25/2017   HCT 39.3 02/25/2017   MCV 97.3 02/25/2017   PLT 181 02/25/2017    Recent Labs Lab 02/25/17 1134  NA 140  K 3.8  CL 105  CO2 28  BUN <5*  CREATININE 0.81  CALCIUM 10.2  PROT 6.7  BILITOT 0.6  ALKPHOS 39  ALT 15  AST 22  GLUCOSE 109*   No results found for: CHOL, HDL, LDLCALC, TRIG No results found for: [facility]   Radiology Studies    Dg Chest 2 View  Result Date: 02/25/2017 CLINICAL DATA:  Altered mental status. History breast malignancy metastatic to the brain EXAM: CHEST  2 VIEW COMPARISON:  Chest x-ray of August 14, 2016  FINDINGS: The lungs are well-expanded. There is no focal infiltrate. There is no pleural effusion. No pulmonary parenchymal nodules or masses are observed. The heart and pulmonary vascularity are normal. The power port catheter tip projects over the midportion of the SVC. The bony thorax exhibits no acute abnormality. IMPRESSION: There is no pneumonia, CHF, nor other acute cardiopulmonary abnormality. Electronically Signed   By: David  Martinique M.D.   On: 02/25/2017 16:17   Ct Head Wo Contrast  Result Date: 02/25/2017 CLINICAL DATA:  Altered mental status EXAM: CT HEAD WITHOUT CONTRAST TECHNIQUE: Contiguous axial images were obtained from the base of the skull through the vertex without intravenous contrast. COMPARISON:  12/03/2016.  MRI 12/10/2016. FINDINGS: Brain: Postoperative changes in the left occipital region. No acute intracranial abnormality. Specifically, no hemorrhage, hydrocephalus, mass lesion, acute infarction, or significant intracranial injury. Vascular: No hyperdense vessel or unexpected calcification. Skull: No acute calvarial abnormality. Prior craniectomy and mesh placement in the left occipital region. Sinuses/Orbits: Visualized paranasal sinuses and mastoids clear. Orbital soft tissues unremarkable. Other: None IMPRESSION: Postoperative changes in the left occipital region. No acute intracranial abnormality.  Electronically Signed   By: Rolm Baptise M.D.   On: 02/25/2017 16:06    EKG & Cardiac Imaging    EKG: NSR 96 bpm with poor r-wave progression and non-specific T wave changes  Echocardiogram:  Pending  Echo 11/11/16 EF 60-65%, LS&' 9.2 cm/sec GLS -15.6% (underestimated due to   poor endocardial tracking).  Echo 07/01/2016 EF 65-46%, normal diastolic function parameters, Normal wall motion. LAteral s&' 10.8 cm/sec. GLS - 19.8%   Assessment & Plan    S/P Breast cancer with brain mets -Followed by Dr. Haroldine Laws in the cardio-oncology clinic. Treated with docetaxel, trastuzumab and pertuzumab (started 11/16), with the final (6th) docetaxel dose given 02/11/2016. She receives denosumab/Xgeva every 6 weeks. Finished radiation 06/26/2016. Advised to continue Herceptin. -Continuing Arimidex -Echo ordered by oncologist. Was not completed because pt refused to finish. They will attempt later when pt agrees.  -Pt is low risk from CV standpoint to proceed with MRI under anesthesia.   Acute Encephalopathy -Pt with intermittent confusion since her craniectomy for brain mets in 07/2016 -MRI of the brain ordered, but MRI will not do until has cardiac clearance. Will need anesthesia for test.  -Workup per IM.    SignedDaune Perch, NP-C 03/02/2017, 1:33 PM Pager: [phone]  Personally seen and examined. Agree with above.  56 year old female with breast cancer being seen at the request of Dr. Carolin Sicks for evaluation of pre-general anesthesia cardiac risk assessment prior to MRI.   - She may proceed with MRI under general anesthesia with low overall cardiac risk. Ejection fraction is normal. She does not describe any exertional chest discomfort, she is able to achieve greater than 4 METS of activity without difficulty. EKG shows nonspecific T-wave flattening. No ischemic changes. No adverse arrhythmias. No valvular disease.  Physical exam: Alopecia noted, heart regular rate and rhythm  without any murmurs, lungs are clear to auscultation, affect-depressed, cried at times. Frustrated.  Please let us know if we can be of further assistance.  Candee Furbish, MD

## 2017-03-02 NOTE — Care Management Note (Addendum)
Case Management Note  Patient Details  Name: Rovena Hearld MRN: [id number] Date of Birth: 08/30/1961  Subjective/Objective:          Admitted with acute encephalopathy, hx of  remote alcohol abuse, bipolar disorder, cancer of the left breast with brain metastases status post left mastectomy and axillary node dissection with craniectomy and radiation to the brain and breast. Pt states she resides with 2 roommates.      [address] [place] (Sister)  Roma [phone]      PCP: Arnoldo Morale  Action/Plan: Plan is to d/c pt to home on tomorrow with home health services, MRI pending. CM to f/u with [facility] for post hospital f/u appointment.  Expected Discharge Date:    03/02/2017           Expected Discharge Plan:  Home with home health services In-House Referral:  Clinical Social Work  Discharge planning Services  CM Consult  Post Acute Care Choice:    Choice offered to:  Patient  DME Arranged:    DME Agency:    HH Arranged:  RN, Nurse's Aide and SW HH Agency:  [place], [place] made referral with Butch Penny @ [phone], pending MD orderes. CM has requested orders from MD.  Status of Service:  Completed, signed off  If discussed at [place] of Stay Meetings, dates discussed:    Additional Comments:  Sharin Mons, RN 03/02/2017, 12:53 PM

## 2017-03-02 NOTE — Telephone Encounter (Signed)
-----   Message from Carilyn Goodpasture, RN sent at 02/22/2017  5:46 PM EDT ----- Left message on voicemail to return call.

## 2017-03-03 ENCOUNTER — Inpatient Hospital Stay (HOSPITAL_COMMUNITY): Payer: Medicaid Other

## 2017-03-03 ENCOUNTER — Encounter (HOSPITAL_COMMUNITY): Payer: Self-pay | Admitting: Radiology

## 2017-03-03 DIAGNOSIS — I349 Nonrheumatic mitral valve disorder, unspecified: Secondary | ICD-10-CM

## 2017-03-03 LAB — ECHOCARDIOGRAM COMPLETE

## 2017-03-03 MED ORDER — QUETIAPINE FUMARATE 50 MG PO TABS: 50.0000 mg | ORAL_TABLET | Freq: Two times a day (BID) | ORAL | 0 refills | Status: DC

## 2017-03-03 MED ORDER — LAMOTRIGINE 100 MG PO TABS: 150.0000 mg | ORAL_TABLET | Freq: Every day | ORAL | 1 refills | Status: DC

## 2017-03-03 NOTE — Progress Notes (Signed)
Occupational Therapy Treatment Patient Details Name: [name] MRN: [id number] DOB: 1961-06-04 Today's Date: 03/03/2017    History of present illness Pt is a 56 y.o. female who presented to the ED with confusion. She has a medical history significant for remote alcohol abuse, bipolar disorder, cancer of the left breast with brain metastases status post left mastectomy and axillary node dissection with craniectomy and radiation to the brain and breast. Since the patient's craniectomy in September 2017, she has had multiple ED visits and an admission to the hospital for confusion. This has seemed to wax and wane, and it is not clear if she completely normalizes between the episodes.   OT comments  Pt anxious throughout session and was concerned over time that taxi would arrive. Able to complete simple path-finding task with VC's to assist with memory this session. Educated pt on use of pill organizer and pt reports understanding but was unable to successfully complete simple medication management task independently. Recommended that pt have assistance from her roommates to assist with this and she initially agreed with therapist but later reported "I will if I feel like it." Continue to feel that pt will need 24 hour assistance post-acute D/C as she requires close supervision and VC's for safety with all ADL tasks. D/C recommendation of home health OT services remains appropriate.    Follow Up Recommendations  Home health OT;Supervision/Assistance - 24 hour    Equipment Recommendations  3 in 1 bedside commode;Tub/shower seat    Recommendations for Other Services      Precautions / Restrictions Precautions Precautions: Fall Precaution Comments: Impulsive and unsteady Restrictions Weight Bearing Restrictions: No       Mobility Bed Mobility               General bed mobility comments: Ambulating in hallway with sitter on arrival.  Transfers Overall transfer level: Needs  assistance Equipment used: None Transfers: Sit to/from Stand Sit to Stand: Supervision         General transfer comment: Close supervision for safety.    Balance Overall balance assessment: Needs assistance Sitting-balance support: No upper extremity supported;Feet supported Sitting balance-Leahy Scale: Good     Standing balance support: No upper extremity supported;During functional activity Standing balance-Leahy Scale: Fair Standing balance comment: close supervision for stability                           ADL either performed or assessed with clinical judgement   ADL Overall ADL's : Needs assistance/impaired     Grooming: Supervision/safety;Standing                   Toilet Transfer: Supervision/safety;Ambulation Toilet Transfer Details (indicate cue type and reason): Impulsively moving throughout session.         Functional mobility during ADLs: Supervision/safety General ADL Comments: Pt with minimal safety awareness. Although able to report that she is "going too fast" with VC's she continues to demonstrate impulsivity.     Vision   Additional Comments: Able to read this session.    Perception     Praxis      Cognition Arousal/Alertness: Awake/alert Behavior During Therapy: Impulsive Overall Cognitive Status: No family/caregiver present to determine baseline cognitive functioning Area of Impairment: Orientation;Memory;Following commands;Safety/judgement;Awareness;Problem solving;Attention                 Orientation Level: Disoriented to;Situation Current Attention Level: Selective Memory: Decreased short-term memory Following Commands: Follows one step commands inconsistently Safety/Judgement:  Decreased awareness of safety;Decreased awareness of deficits Awareness: Intellectual Problem Solving: Slow processing;Decreased initiation;Difficulty sequencing;Requires verbal cues General Comments: Pt anxious during session  concerning when taxi would arrive to pick her up and became upset at times. Initiated education concerning use of pill organizer and pt unable to problem solve through simple medication management scenarios. Pt able to complete pathfinding tasks with improved independence but continues to require maximum VC's to maintain safety.        Exercises     Shoulder Instructions       General Comments      Pertinent Vitals/ Pain       Pain Assessment: No/denies pain  Home Living                                          Prior Functioning/Environment              Frequency  Min 1X/week        Progress Toward Goals  OT Goals(current goals can now be found in the care plan section)  Progress towards OT goals: Progressing toward goals  Acute Rehab OT Goals Patient Stated Goal: to get "Scott" to help with home health OT Goal Formulation: With patient Time For Goal Achievement: 03/15/17 Potential to Achieve Goals: Good ADL Goals Pt Will Perform Grooming: with modified independence;standing Pt Will Transfer to Toilet: with modified independence;ambulating;regular height toilet Pt Will Perform Toileting - Clothing Manipulation and hygiene: with modified independence;sit to/from stand Pt Will Perform Tub/Shower Transfer: with modified independence;ambulating;Tub transfer;shower seat Additional ADL Goal #1: Pt will demonstrate anticipatory awareness in a minimally distracting environment during morning ADL routine. Additional ADL Goal #2: Pt will follow 4/5 multi-step commands independently during ADL tasks to improve safety with ADL.  Plan Discharge plan remains appropriate    Co-evaluation                 End of Session    OT Visit Diagnosis: Muscle weakness (generalized) (M62.81);Other abnormalities of gait and mobility (R26.89)   Activity Tolerance Patient tolerated treatment well   Patient Left with nursing/sitter in room (ambulating with  sitter)   Nurse Communication Mobility status        Time: 4008-6761 OT Time Calculation (min): 17 min  Charges: OT General Charges $OT Visit: 1 Procedure OT Treatments $Self Care/Home Management : 8-22 mins  Norman Herrlich, MS OTR/L  Pager: [phone] 03/03/2017, 6:08 PM

## 2017-03-03 NOTE — Care Management Note (Signed)
Case Management Note  Patient Details  Name: Brit Carbonell MRN: [id number] Date of Birth: [birth year]  Subjective/Objective:                  Admitted with acute encephalopathy, hx of  remote alcohol abuse, bipolar disorder, cancer of the left breast with brain metastases status post left mastectomy and axillary node dissection with craniectomy and radiation to the brain and breast.       Action/Plan: Plan is to d/c to home today.  Pt originally stated friend Cletus  @ [place] was to provide transportation to home. However, CM has made calls to Cletus , along with pt, to confirm transportation to home but calls were unsuccessful. CM spoke with pt's house mate Monica Martinez @ [phone] and confirmed he would be @  home to receive pt once d/c, pt without house keys. CM notified CSW pt without transportation to home and requested taxi voucher for pt. CM spoke with pt's sister Arby Barrette @ [phone] and made her aware of d/c plan.  Expected Discharge Date:  03/03/17               Expected Discharge Plan:  Home   In-House Referral:  Clinical Social Work  Discharge planning Services  CM Consult  Post Acute Care Choice:    Choice offered to:  Patient  DME Arranged:    DME Agency:     HH Arranged:  RN, Nurse's Aide, Social Work [employer] Agency:  [place]  Status of Service:  Completed, signed off  If discussed at [employer], dates discussed:    Additional Comments:  Sharin Mons, RN 03/03/2017, 2:41 PM

## 2017-03-03 NOTE — Discharge Summary (Addendum)
Physician Discharge Summary  [name] BMW:[id number] DOB: January 27, 1961 DOA: 02/25/2017  PCP: Shannon Morale, MD  Admit date: 02/25/2017 Discharge date: 03/03/2017  Admitted From:Home Disposition:home with support/home care  Recommendations for Outpatient Follow-up:  1. Follow up with PCP, oncologist in 1-2 weeks 2. 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 Since the patient's craniectomy in September 2017, she has had multiple ED visits and an admission to the hospital for confusion. This has seemed to wax and wane, and it is not clear if she completely normalizes between the episodes. She is now brought in after her roommate raises concern for several days of confusion, but is not available at time of admission to assist with additional history. Patient has apparently been intermittently confused, but has not voiced any specific complaints. Patient denies any pain, dyspnea, nausea, vomiting, or diarrhea. She acknowledges her confusion and reports that it began after her brain surgery in September 2017. She also reports an unsteady gait with occasional falls since her brain surgery, but denies any significant injury related to these.  In ED: CT is negative for acute intracranial abnormality.Psychiatry was consulted by the ED physician and feels that the current presentation was not psychiatric.  #Acute encephalopathy of unknown etiology: -Reportedly patient has intermittent confusion since brain surgery. Head CT with no acute finding. As  per prior note, psychiatry was consulted from the ER. Reportedly patient had multiple admissions for the confusion with no known diagnosis.  -Patient had workup including TSH, vitamin B12,  ammonia, RPR, HIV antibody unremarkable test result in January 2018. -Patient had MRI underneath this area with no acute finding. Echocardiogram was done as ordered by oncologist. Echo showed diastolic dysfunction. -Patient's mental status improved. She was alert awake and oriented. She understand follow-up instructions and her current clinical condition. Patient has capacity to make her clinical decision as per psychiatrist. I discussed with the case manager regarding home care services. Patient is very eager to go home. Home care services arranged.  # Anxiety and history of bipolar disorder:  -Continue Lamictal, Neurontin, lithium, trazodone. Seroquel added as per psychiatrist, in addition. I recommended patient to follow-up with her psychiatrist.  #Metastatic breast cancer: Status post left mastectomy and brain surgery. Recommended to follow-up with oncologist outpatient. MRI and echocardiogram was done.  #Presumed UTI, site unspecified: Urine culture has no growth.Discontinued antibiotics.  It was noted that the patient has low blood pressure. She denied headache, dizziness, chest pain, shortness of breath. She is able to ambulate without difficulties. Recommended to monitor blood pressure and follow-up with PCP.  Discharge Diagnoses:  Principal Problem:   Confusion Active Problems:   H/O alcohol abuse   Brain metastasis (HCC)     Metastatic breast cancer (HCC)   Bipolar I disorder, most recent episode depressed ([place])   Acute lower UTI    Discharge Instructions  Discharge Instructions    Call MD for:  difficulty breathing, headache or visual disturbances    Complete by:  As directed    Call MD for:  extreme fatigue    Complete by:  As directed    Call MD for:  hives    Complete  by:  As  directed    Call MD for:  persistant dizziness or light-headedness    Complete by:  As directed    Call MD for:  persistant nausea and vomiting    Complete by:  As directed    Call MD for:  severe uncontrolled pain    Complete by:  As directed    Call MD for:  temperature >100.4    Complete by:  As directed    Diet - low sodium heart healthy    Complete by:  As directed    Discharge instructions    Complete by:  As directed    Please follow up with your PCP, oncologist and psychiatrist.   Increase activity slowly    Complete by:  As directed      Allergies as of 03/03/2017      Reactions   Demerol Itching, Nausea And Vomiting   Erythromycin Rash      Medication List    TAKE these medications   anastrozole 1 MG tablet Commonly known as:  ARIMIDEX Take 1 tablet (1 mg total) by mouth daily.   Biotin 1 MG Caps Take 1 mg by mouth daily.   cetirizine 10 MG tablet Commonly known as:  ZYRTEC Take 1 tablet (10 mg total) by mouth daily.   diphenhydrAMINE 25 MG tablet Commonly known as:  BENADRYL Take 25 mg by mouth every 6 (six) hours as needed for allergies.   gabapentin 300 MG capsule Commonly known as:  NEURONTIN Take 600 mg by mouth 3 (three) times daily.   ibuprofen 200 MG tablet Commonly known as:  ADVIL,MOTRIN Take 800 mg by mouth every 6 (six) hours as needed for headache, mild pain or moderate pain.   lamoTRIgine 100 MG tablet Commonly known as:  LAMICTAL Take 1.5 tablets (150 mg total) by mouth daily. What changed:  when to take this   lidocaine-prilocaine cream Commonly known as:  EMLA Apply 1 application topically as needed (prior to accessing port).   lithium carbonate 300 MG capsule Take 300 mg by mouth 2 (two) times daily with a meal.   pantoprazole 40 MG tablet Commonly known as:  PROTONIX Take 40 mg by mouth daily.   potassium chloride 10 MEQ CR capsule Commonly known as:  MICRO-K Take 1 capsule (10 mEq total) by mouth daily.   QUEtiapine 50  MG tablet Commonly known as:  SEROQUEL Take 1 tablet (50 mg total) by mouth 2 (two) times daily.   traZODone 100 MG tablet Commonly known as:  DESYREL Take 200 mg by mouth at bedtime.      Follow-up Information    Shannon Morale, MD. Schedule an appointment as soon as possible for a visit in 1 week(s).   Specialty:  Family Medicine Contact information: [place] [address] [phone]        Chauncey Cruel, MD. Schedule an appointment as soon as possible for a visit in 1 week(s).   Specialty:  Oncology Contact information: [address] [place] [address] [phone]          Allergies  Allergen Reactions  . Demerol Itching and Nausea And Vomiting  . Erythromycin Rash    Consultations: Psychiatrist Oncologist  Procedures/Studies: MRI Echocardiogram  Subjective: Patient was seen and examined at bedside. She was alert awake and oriented. Very eager to go home today. Activity to get an echocardiogram before discharge. Denied headache, dizziness, chest pain, shortness of breath, nausea or vomiting.  Discharge Exam:  Vitals:   03/02/17 2124 03/03/17 0615  BP: 98/60 (!) 99/54  Pulse: 92 80  Resp: 18 18  Temp: 98.3 F (36.8 C) 98.2 F (36.8 C)   Vitals:   03/02/17 1645 03/02/17 1715 03/02/17 2124 03/03/17 0615  BP: (!) 117/58  98/60 (!) 99/54  Pulse: 99  92 80  Resp: 13  18 18   Temp: 97.7 F (36.5 C) 97.5 F (36.4 C) 98.3 F (36.8 C) 98.2 F (36.8 C)  TempSrc:    Oral  SpO2: 100%  95% 99%    General: Pt is alert, awake, not in acute distress Cardiovascular: RRR, S1/S2 +, no rubs, no gallops Respiratory: CTA bilaterally, no wheezing, no rhonchi Abdominal: Soft, NT, ND, bowel sounds + Extremities: no edema, no cyanosis    The results of significant diagnostics from this hospitalization (including imaging, microbiology, ancillary and laboratory) are listed below for reference.     Microbiology: Recent  Results (from the past 240 hour(s))  Urine culture     Status: None   Collection Time: 02/25/17  6:06 PM  Result Value Ref Range Status   Specimen Description URINE, CLEAN CATCH  Final   Special Requests NONE  Final   Culture NO GROWTH  Final   Report Status 02/26/2017 FINAL  Final     Labs: BNP (last 3 results) No results for input(s): BNP in the last 8760 hours. Basic Metabolic Panel:  Recent Labs Lab 02/25/17 1134  NA 140  K 3.8  CL 105  CO2 28  GLUCOSE 109*  BUN <5*  CREATININE 0.81  CALCIUM 10.2   Liver Function Tests:  Recent Labs Lab 02/25/17 1134  AST 22  ALT 15  ALKPHOS 39  BILITOT 0.6  PROT 6.7  ALBUMIN 4.3   No results for input(s): LIPASE, AMYLASE in the last 168 hours. No results for input(s): AMMONIA in the last 168 hours. CBC:  Recent Labs Lab 02/25/17 1134  WBC 4.7  HGB 12.5  HCT 39.3  MCV 97.3  PLT 181   Cardiac Enzymes: No results for input(s): CKTOTAL, CKMB, CKMBINDEX, TROPONINI in the last 168 hours. BNP: Invalid input(s): POCBNP CBG:  Recent Labs Lab 02/25/17 1754  GLUCAP 85   D-Dimer No results for input(s): DDIMER in the last 72 hours. Hgb A1c No results for input(s): HGBA1C in the last 72 hours. Lipid Profile No results for input(s): CHOL, HDL, LDLCALC, TRIG, CHOLHDL, LDLDIRECT in the last 72 hours. Thyroid function studies No results for input(s): TSH, T4TOTAL, T3FREE, THYROIDAB in the last 72 hours.  Invalid input(s): FREET3 Anemia work up No results for input(s): VITAMINB12, FOLATE, FERRITIN, TIBC, IRON, RETICCTPCT in the last 72 hours. Urinalysis    Component Value Date/Time   COLORURINE STRAW (A) 02/25/2017 1806   APPEARANCEUR CLEAR 02/25/2017 1806   LABSPEC 1.006 02/25/2017 1806   PHURINE 8.0 02/25/2017 1806   GLUCOSEU NEGATIVE 02/25/2017 1806   HGBUR NEGATIVE 02/25/2017 1806   BILIRUBINUR NEGATIVE 02/25/2017 1806   BILIRUBINUR negative 03/28/2015 1321   KETONESUR 5 (A) 02/25/2017 1806   PROTEINUR  NEGATIVE 02/25/2017 1806   UROBILINOGEN 0.2 03/28/2015 1321   UROBILINOGEN 0.2 04/18/2013 1222   NITRITE NEGATIVE 02/25/2017 1806   LEUKOCYTESUR SMALL (A) 02/25/2017 1806   Sepsis Labs Invalid input(s): PROCALCITONIN,  WBC,  LACTICIDVEN Microbiology Recent Results (from the past 240 hour(s))  Urine culture     Status: None   Collection Time: 02/25/17  6:06 PM  Result Value Ref Range Status   Specimen Description URINE,  CLEAN CATCH  Final   Special Requests NONE  Final   Culture NO GROWTH  Final   Report Status 02/26/2017 FINAL  Final     Time coordinating discharge: 28 minutes  SIGNED:   Rosita Fire, MD  [facility] 03/03/2017, 1:02 PM  If 7PM-7AM, please contact night-coverage [email or web address] Password [email or web address]

## 2017-03-03 NOTE — Progress Notes (Signed)
[name] to be D/C'd Home per MD order.  Discussed with the patient and all questions fully answered.  VSS, Skin clean, dry and intact without evidence of skin break down, no evidence of skin tears noted. IV catheter discontinued intact. Site without signs and symptoms of complications. Dressing and pressure applied.  An After Visit Summary was printed and given to the patient. Patient received prescription.  D/c education completed with patient/family including follow up instructions, medication list, d/c activities limitations if indicated, with other d/c instructions as indicated by MD - patient able to verbalize understanding, all questions fully answered.   Patient instructed to return to ED, call 911, or call MD for any changes in condition.   Patient escorted via [place], and D/C home via taxi with voucher pts roommate [name] will meet pt at the home.  Luci Bank 03/03/2017 1:51 PM

## 2017-03-03 NOTE — Progress Notes (Signed)
  Echocardiogram 2D Echocardiogram has been performed. Patient reported pain due to implant during the length of exam.   [name] L Androw 03/03/2017, 11:32 AM

## 2017-03-03 NOTE — Progress Notes (Signed)
Gave pt pill Box to help her comply with taken medication

## 2017-03-03 NOTE — Progress Notes (Signed)
[name]   DOB:01-17-1961   NL#:[id number]   EYC#:[id number]  Subjective:  [name] is "tired of being here." Discussed MRI results which show no evidence of active cancer. Discussed what it is that causes her to be intermittently confused. Agrees that she frequently is not sure if she took her meds or not. She has a complex pattern at home--puts some pills on the side to take in the morning, other pills in a bag to take at night. She agrees that medication irregularities could explain her confusion episodes. --Tells me she stopped the echo because she got "fed up" being in the hospital and going through tests. Sitter in room  Objective: middle aged White woman examined in bed  Vitals:   03/02/17 2124 03/03/17 0615  BP: 98/60 (!) 99/54  Pulse: 92 80  Resp: 18 18  Temp: 98.3 F (36.8 C) 98.2 F (36.8 C)    There is no height or weight on file to calculate BMI.  Intake/Output Summary (Last 24 hours) at 03/03/17 0751 Last data filed at 03/02/17 1836  Gross per 24 hour  Intake              880 ml  Output                0 ml  Net              880 ml   Neuro is intact, A&) x3   CBG (last 3)  No results for input(s): GLUCAP in the last 72 hours.   Labs:  Lab Results  Component Value Date   WBC 4.7 02/25/2017   HGB 12.5 02/25/2017   HCT 39.3 02/25/2017   MCV 97.3 02/25/2017   PLT 181 02/25/2017   NEUTROABS 4.5 02/12/2017    _0 @  Urine Studies No results for input(s): UHGB, CRYS in the last 72 hours.  Invalid input(s): UACOL, UAPR, USPG, UPH, UTP, UGL, UKET, UBIL, UNIT, UROB, ULEU, UEPI, UWBC, URBC, UBAC, CAST, UCOM, BILUA  Basic Metabolic Panel:  Recent Labs Lab 02/25/17 1134  NA 140  K 3.8  CL 105  CO2 28  GLUCOSE 109*  BUN <5*  CREATININE 0.81  CALCIUM 10.2   GFR Estimated Creatinine Clearance: 70.6 mL/min (by C-G formula based on SCr of 0.81 mg/dL). Liver Function Tests:  Recent Labs Lab 02/25/17 1134  AST 22  ALT 15  ALKPHOS 39  BILITOT  0.6  PROT 6.7  ALBUMIN 4.3   No results for input(s): LIPASE, AMYLASE in the last 168 hours. No results for input(s): AMMONIA in the last 168 hours. Coagulation profile No results for input(s): INR, PROTIME in the last 168 hours.  CBC:  Recent Labs Lab 02/25/17 1134  WBC 4.7  HGB 12.5  HCT 39.3  MCV 97.3  PLT 181   Cardiac Enzymes: No results for input(s): CKTOTAL, CKMB, CKMBINDEX, TROPONINI in the last 168 hours. BNP: Invalid input(s): POCBNP CBG:  Recent Labs Lab 02/25/17 1754  GLUCAP 85   D-Dimer No results for input(s): DDIMER in the last 72 hours. Hgb A1c No results for input(s): HGBA1C in the last 72 hours. Lipid Profile No results for input(s): CHOL, HDL, LDLCALC, TRIG, CHOLHDL, LDLDIRECT in the last 72 hours. Thyroid function studies No results for input(s): TSH, T4TOTAL, T3FREE, THYROIDAB in the last 72 hours.  Invalid input(s): FREET3 Anemia work up No results for input(s): VITAMINB12, FOLATE, FERRITIN, TIBC, IRON, RETICCTPCT in the last 72 hours. Microbiology Recent Results (from the past 240 hour(s))  Urine culture     Status: None   Collection Time: 02/25/17  6:06 PM  Result Value Ref Range Status   Specimen Description URINE, CLEAN CATCH  Final   Special Requests NONE  Final   Culture NO GROWTH  Final   Report Status 02/26/2017 FINAL  Final      Studies:  Mr Jeri Cos PQ Contrast  Result Date: 03/02/2017 CLINICAL DATA:  Followup metastatic disease to the brain. Several day history of confusion. EXAM: MRI HEAD WITHOUT AND WITH CONTRAST TECHNIQUE: Multiplanar, multiecho pulse sequences of the brain and surrounding structures were obtained without and with intravenous contrast. CONTRAST:  34m MULTIHANCE GADOBENATE DIMEGLUMINE 529 MG/ML IV SOLN COMPARISON:  CT 02/25/2017.  MRI 12/10/2016. FINDINGS: Brain: Diffusion imaging does not show any acute or subacute infarction or other cause of restricted diffusion. There has been previous left occipital  craniotomy for tumor section. No evidence of residual or recurrent disease. Postoperative enhancement continues to diminish. No newly seen metastatic lesion elsewhere. No hemorrhage, hydrocephalus or extra-axial collection. Minimal small-vessel or radiation changes of the white matter as seen previously. Vascular: Major vessels at the base of the brain show flow. Skull and upper cervical spine: Negative Sinuses/Orbits: Clear/normal Other: None IMPRESSION: No worsening or new finding. No explanation for confusion. Previous resection of a left cerebellar metastasis. No evidence of residual or recurrent disease in that location. Diminishing postoperative enhancement. Electronically Signed   By: MNelson ChimesM.D.   On: 03/02/2017 16:31    Assessment: 56y.o. 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  (2) status post left mastectomy and axillary lymph node dissection  with immediate expander placement 07/18/2015 for an mpT4 pN2,stage IIIB invasive ductal carcinoma, grade 3, with negative margins.             (a) definitive implant exchange to be scheduled in December            METASTATIC DISEASE: October 2016  (3) CT scan of the chest abdomen and pelvis  09/16/2015 shows metastatic lesions in the right scapula, left iliac crest, L4, and T spine. There were questionable liver cysts, with repeat CT scan 03/02/2016 showing possible right upper lobe lung lesions and possibly increased liver lesions             (a) CT scan of the chest 06/17/2016 shows no active disease in the lungs or liver             (b) Bone scan July 2017 showed no evidence of bony metastatic disease              (c) head CT 07/08/2016 showed a cerebellar lesion,  confirmed by MRI 07/23/2016, status post craniotomy 08/14/2016, confirming a metastatic deposit which was estrogen and progesterone receptor negative, HER-2 amplified with a signals ratio of 7.16, number per cell 13.25             (d) CA 27-29 not informative  (4) received docetaxel every 3 weeks 6 together with trastuzumab and pertuzumab, last docetaxel dose 02/11/2016  (5) adjuvant radiation7/03/2016 to 06/26/2016 at [facility] 1. The Left chest wall was treated to 23.4 Gy in 13 fractions at 1.8 Gy per fraction. 2. The Left chest wall was boosted to 10 Gy in 5  fractions at 2 Gy per fraction. 3. The Left Sclav/PAB was treated to 23.4 Gy in 13 fractions at 1.8 Gy per fraction.  Including the patient's treatment in [place] (received 15 fractions per Dr. Maryan Rued near [place], [place]), the patient received 50.4 Gy to the left chest wall and supraclavicular region.   (6) started trastuzumab and pertuzumab October 2016, continuing every 3 weeks,             (a) echocardiogram 02/26/2016 showed a well preserved ejection fraction             (b) echocardiogram 07/01/2016 shows an ejection fraction in the 60-65%              (c) pertuzumab discontinued 08/25/2016 with uncontrolled diarrhea             (d) echocardiogram 11/11/2016 showed an ejection fraction in the 60-65%  (7) started denosumab/Xgeva October 2017, repeated every 6 weeks  (8) started anastrozole October 2017              (a) bone scan 11/10/2016 shows no active disease             (b) chest CT scan 11/10/2016 stable, with no evidence of active disease  (9) history of bipolar disorder             (a) lithium level checked every 6 weeks  (10) mild anemia with a significant drop in the MCV, ferritin 10 06/03/2016,              (a) Feraheme given 06/12/2016 and 06/18/2016  (11) tobacco abuse: Chantix started 06/18/2016  (12) 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              (a) brain irradiation10/19/17-11/08/17:Whole brain/ 35 Gy in 14 fractions              (b) repeat brain MRI obtained 12/10/2016 shows no active disease in the brain  (c) repeat brain MRI under sedation 03/02/2017 shows NAD  Plan: I reviewed the MRI results with patient. As noted, they show no evidence of cancer in the CNS. While psychiatry's initial note suggests we are dealing with an organic cause of the patient's confusion, I am not sure what that is--workup this admission has all been negative.  More likely I think we are dealing with the patient's underlying bipolar disorder and medication noncompliance. Discussed what can be done to help with compliance: she can use a pill box (requested one from pharmacy); she can have one of the other residents where she lives (or [facility]?) make sure she fills the pillbox correctly once a week, and monitor whether she is taking the medications.  She tells me she is seeing a psychiatrist "on [address] but does not know the name or whether she has a future appointment there. She is aware of her restaging studies next week (bone scan and chest CT). She is also due for treatment in our office 04/06 at 9 AM  It would be helpful to complete her echo pre dc but if it cannot be done this morning and you plan to discharge her we will schedule that as outpatient.  I am afraid this patient is at high risk for readmission-- she is going back to the same situation she was in previously and I am not sure what can be done beyond what I just suggested to make sure the same result does not occur.   I greatly appreciate  your extraordinary efforts to help this patient! May discharge at your discretion.     Chauncey Cruel, MD 03/03/2017  7:51 AM Medical Oncology and Hematology [facility] [address] [place], [place] [address] Tel. [phone]    Fax. [phone]

## 2017-03-04 ENCOUNTER — Telehealth: Payer: Self-pay | Admitting: *Deleted

## 2017-03-04 NOTE — Telephone Encounter (Signed)
"  I was discharged from the hospital yesterday.  I do not have my medications I brought in with me when I was admitted.  I do not know what to do.  I can't go all weekend with no medicines.  Can you direct me to the hospital."   Discharged from [facility] which this nurse called, spoke with Lurline Idol who says "The charge nurse is not available at this time but has previously talked with patient this morning.  Charge nurse is looking into this matter with our pharmacy and will call patient with results."  Returned call after call lost/hung up and notified patient to expect call from charge nurse.  "What if they do not locate the medicines."  Double check your belongings to ensure you've not overlooked them and ask Hospital staff next steps pending outcome response from pharmacy.

## 2017-03-05 ENCOUNTER — Other Ambulatory Visit (HOSPITAL_BASED_OUTPATIENT_CLINIC_OR_DEPARTMENT_OTHER): Payer: Medicaid Other

## 2017-03-05 ENCOUNTER — Ambulatory Visit (HOSPITAL_BASED_OUTPATIENT_CLINIC_OR_DEPARTMENT_OTHER): Payer: Medicaid Other | Admitting: Adult Health

## 2017-03-05 ENCOUNTER — Ambulatory Visit (HOSPITAL_BASED_OUTPATIENT_CLINIC_OR_DEPARTMENT_OTHER): Payer: Medicaid Other

## 2017-03-05 ENCOUNTER — Encounter: Payer: Self-pay | Admitting: Adult Health

## 2017-03-05 ENCOUNTER — Other Ambulatory Visit: Payer: Self-pay | Admitting: Oncology

## 2017-03-05 ENCOUNTER — Telehealth: Payer: Self-pay | Admitting: *Deleted

## 2017-03-05 VITALS — BP 105/59 | HR 100 | Temp 98.2°F | Resp 18 | Ht 65.0 in | Wt 128.6 lb

## 2017-03-05 DIAGNOSIS — C7951 Secondary malignant neoplasm of bone: Secondary | ICD-10-CM | POA: Diagnosis not present

## 2017-03-05 DIAGNOSIS — Z5112 Encounter for antineoplastic immunotherapy: Secondary | ICD-10-CM

## 2017-03-05 DIAGNOSIS — C50312 Malignant neoplasm of lower-inner quadrant of left female breast: Secondary | ICD-10-CM | POA: Diagnosis present

## 2017-03-05 DIAGNOSIS — C773 Secondary and unspecified malignant neoplasm of axilla and upper limb lymph nodes: Secondary | ICD-10-CM | POA: Diagnosis not present

## 2017-03-05 DIAGNOSIS — Z170 Estrogen receptor positive status [ER+]: Secondary | ICD-10-CM

## 2017-03-05 DIAGNOSIS — C7931 Secondary malignant neoplasm of brain: Secondary | ICD-10-CM

## 2017-03-05 LAB — CBC WITH DIFFERENTIAL/PLATELET
BASO%: 0.5 % (ref 0.0–2.0)
Basophils Absolute: 0 10*3/uL (ref 0.0–0.1)
EOS%: 2.5 % (ref 0.0–7.0)
Eosinophils Absolute: 0.1 10*3/uL (ref 0.0–0.5)
HCT: 39.6 % (ref 34.8–46.6)
HGB: 13.4 g/dL (ref 11.6–15.9)
LYMPH%: 16.8 % (ref 14.0–49.7)
MCH: 32.2 pg (ref 25.1–34.0)
MCHC: 33.9 g/dL (ref 31.5–36.0)
MCV: 95 fL (ref 79.5–101.0)
MONO#: 0.3 10*3/uL (ref 0.1–0.9)
MONO%: 7 % (ref 0.0–14.0)
NEUT#: 2.6 10*3/uL (ref 1.5–6.5)
NEUT%: 73.2 % (ref 38.4–76.8)
Platelets: 190 10*3/uL (ref 145–400)
RBC: 4.17 10*6/uL (ref 3.70–5.45)
RDW: 12.8 % (ref 11.2–14.5)
WBC: 3.6 10*3/uL — ABNORMAL LOW (ref 3.9–10.3)
lymph#: 0.6 10*3/uL — ABNORMAL LOW (ref 0.9–3.3)

## 2017-03-05 LAB — COMPREHENSIVE METABOLIC PANEL
ALT: 18 U/L (ref 0–55)
AST: 25 U/L (ref 5–34)
Albumin: 4.1 g/dL (ref 3.5–5.0)
Alkaline Phosphatase: 45 U/L (ref 40–150)
Anion Gap: 9 mEq/L (ref 3–11)
BUN: 6.9 mg/dL — ABNORMAL LOW (ref 7.0–26.0)
CO2: 25 mEq/L (ref 22–29)
Calcium: 9.8 mg/dL (ref 8.4–10.4)
Chloride: 108 mEq/L (ref 98–109)
Creatinine: 0.8 mg/dL (ref 0.6–1.1)
EGFR: 85 mL/min/{1.73_m2} — ABNORMAL LOW (ref >90)
Glucose: 119 mg/dl (ref 70–140)
Potassium: 4 mEq/L (ref 3.5–5.1)
Sodium: 141 mEq/L (ref 136–145)
Total Bilirubin: 0.37 mg/dL (ref 0.20–1.20)
Total Protein: 6.4 g/dL (ref 6.4–8.3)

## 2017-03-05 MED ORDER — ACETAMINOPHEN 325 MG PO TABS: 650.0000 mg | ORAL_TABLET | Freq: Once | ORAL | Status: DC

## 2017-03-05 MED ORDER — SODIUM CHLORIDE 0.9 % IV SOLN
Freq: Once | INTRAVENOUS | Status: AC
  Administered 2017-03-05: 10:00:00 via INTRAVENOUS

## 2017-03-05 MED ORDER — SODIUM CHLORIDE 0.9% FLUSH
10.0000 mL | INTRAVENOUS | Status: DC | PRN
  Administered 2017-03-05: 10 mL
  Filled 2017-03-05: qty 10

## 2017-03-05 MED ORDER — MAGIC MOUTHWASH: 5.0000 mL | Freq: Four times a day (QID) | ORAL | 0 refills | Status: DC | PRN

## 2017-03-05 MED ORDER — TRAMADOL HCL 50 MG PO TABS: 50.0000 mg | ORAL_TABLET | Freq: Two times a day (BID) | ORAL | 0 refills | Status: DC | PRN

## 2017-03-05 MED ORDER — TRASTUZUMAB CHEMO 150 MG IV SOLR
6.0000 mg/kg | Freq: Once | INTRAVENOUS | Status: AC
  Administered 2017-03-05: 357 mg via INTRAVENOUS
  Filled 2017-03-05: qty 17

## 2017-03-05 MED ORDER — HEPARIN SOD (PORK) LOCK FLUSH 100 UNIT/ML IV SOLN
500.0000 [IU] | Freq: Once | INTRAVENOUS | Status: AC | PRN
  Administered 2017-03-05: 500 [IU]
  Filled 2017-03-05: qty 5

## 2017-03-05 MED ORDER — DIPHENHYDRAMINE HCL 25 MG PO CAPS: 25.0000 mg | ORAL_CAPSULE | Freq: Once | ORAL | Status: DC

## 2017-03-05 NOTE — Telephone Encounter (Signed)
Contacted by treatment room Charge nurse stating pt is " very adamant that she has to leave now " - pt has approximately 15 minutes left of her herceptin therapy.  Note RN's attempted to discuss need for completion without benefit.  Treatment will be stopped at this time and pt will be discharged home.

## 2017-03-05 NOTE — Patient Instructions (Signed)
[facility] Discharge Instructions for Patients Receiving Chemotherapy  Today you received the following: Herceptin   To help prevent nausea and vomiting after your treatment, we encourage you to take your nausea medication as directed.    If you develop nausea and vomiting that is not controlled by your nausea medication, call the clinic.   BELOW ARE SYMPTOMS THAT SHOULD BE REPORTED IMMEDIATELY:  *FEVER GREATER THAN 100.5 F  *CHILLS WITH OR WITHOUT FEVER  NAUSEA AND VOMITING THAT IS NOT CONTROLLED WITH YOUR NAUSEA MEDICATION  *UNUSUAL SHORTNESS OF BREATH  *UNUSUAL BRUISING OR BLEEDING  TENDERNESS IN MOUTH AND THROAT WITH OR WITHOUT PRESENCE OF ULCERS  *URINARY PROBLEMS  *BOWEL PROBLEMS  UNUSUAL RASH Items with * indicate a potential emergency and should be followed up as soon as possible.  Feel free to call the clinic you have any questions or concerns. The clinic phone number is [phone].  Please show the CHEMO ALERT CARD at check-in to the Emergency Department and triage nurse.   

## 2017-03-05 NOTE — Progress Notes (Signed)
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  Her last echo was on 4/4 and demonstrated LVEF 60-65%.  She was recenlty hospitalized for confusion and scans were done revealing no cancer involvement in her brain.  She was discharged with a pill box.  She was started on Seroquel during her hospitalization and has not continued.  She has not picked it up from the pharmacy.  She doesn't know who is supposed to handle her meds, and has not followed up with mental health as recommended.  She is requesting a refill on tramadol today.  She is due for CT chest/bone scan on 4/9.  REVIEW OF SYSTEMS: [name] is c/o sore throat, but otherwise denies fevers, chills, chest pain, palpitations, or any other concerns.  She says she is still wobbly.    BREAST CANCER HISTORY: From the original intake note:  [name] was aware of a "lemon sized lump in" her left axilla for about a year before bringing it to medical attention. By then she had developed left breast and left axillary swelling (June 2016). She presented to the local emergency room and had a chest CT scan 06/06/2015 which showed a nodule in the left breast measuring 0.9 cm and questionable left axillary adenopathy. She then proceeded to bilateral diagnostic mammography and left breast ultrasonography 06/19/2015. There were no prior films for comparison (last mammography 12 years prior).. The breast density was category C.  Mammography showed in the left breast upper inner quadrant a 7 cm area including a small mass and significant pleomorphic calcifications. Ultrasonography defined the mass as measuring 1.2 cm. The left axilla appeared unremarkable. There was significant skin edema.  Biopsy of the left breast mass 06/19/2015 showed (SP [id number]) an invasive ductal carcinoma, grade 2, estrogen receptor 83% positive, progesterone receptor 26% positive, and HER-2 amplified by immunohistochemstry with a 3+ reading. The patient had biopsies of a separate area in the left breast August of the same year and this showed atypical ductal hyperplasia. (SP [id number]).  Accordingly after appropriate discussion on 08/21/2015 the patient proceeded to left mastectomy with left axillary sentinel lymph node sampling, which, since the lymph nodes were positive, extended to the procedure to left axillary lymph node dissection. The pathology (SP [id number]) showed an invasive ductal carcinoma, grade 3, measuring in excess of 9 cm. There were also skin satellites, not contiguous with the invasive carcinoma. Margins were clear and ample. There was evidence of lymphovascular invasion. A total of 15 lymph nodes were removed, including 5 sentinel lymph nodes, all of which were positive, so that the final total was 14 out of 15 lymph nodes involved by tumor. There was evidence of extranodal extension. The final pathology was pT4b pN2a, stage IIIB  CA-27-29 and CEA 09/19/2015 were non-informative October 2016.  Unfortunately CT scans of the chest abdomen and pelvis 09/16/2015 showed bony metastases to the right scapula, left iliac crest, and also L4 and T-spine. There were questionable liver cysts which on repeat CT scan 03/02/2016 appear to be a little bit more  well-defined, possibly a little larger. There were also some possible right upper lobe lung lesions.  Adjuvant treatment consisted of docetaxel, trastuzumab and pertuzumab, with the final (6th)  docetaxel dose given 02/11/2016. She continues on trastuzumab and pertuzumab, with the 11th cycle given 05/05/2016. Echocardiogram 02/26/2016 showed an ejection fraction of 55%. She receives denosumab/Xgeva every 6 weeks.. She also receives radiation, started 06/09/201, to be completed 06/26/2016.  Her subsequent history is as detailed below   PAST MEDICAL HISTORY: Past Medical History:  Diagnosis Date  . Alcohol abuse   . Anemia    during chemo  . Anxiety    At age 11  . Arthritis Dx 2010  . Bipolar disorder ([place])   . Cancer (HCC)    breast  . Chronic pain   . Depression   . Fibromyalgia Dx 2005  . GERD (gastroesophageal reflux disease)   . Headache    hx  migraines  . Opiate dependence ([place])   . Port-a-cath in place   . PTSD (post-traumatic stress disorder)     PAST SURGICAL HISTORY: Past Surgical History:  Procedure Laterality Date  . APPLICATION OF CRANIAL NAVIGATION N/A 08/14/2016   Procedure: APPLICATION OF CRANIAL NAVIGATION;  Surgeon: Erline Levine, MD;  Location: [facility];  Service: Neurosurgery;  Laterality: N/A;  . BREAST RECONSTRUCTION Left    with silicone implant  . CRANIOTOMY N/A 08/14/2016   Procedure: CRANIOTOMY TUMOR EXCISION WITH Lucky Rathke;  Surgeon: Erline Levine, MD;  Location: [facility];  Service: Neurosurgery;  Laterality: N/A;  . FIBULA FRACTURE SURGERY    . MASTECTOMY Left   . RADIOLOGY WITH ANESTHESIA N/A 07/23/2016   Procedure: MRI OF BRAIN WITH AND WITHOUT;  Surgeon: Medication Radiologist, MD;  Location: [facility];  Service: Radiology;  Laterality: N/A;  . right power port placement Right     FAMILY HISTORY Family History  Problem Relation Age of Onset  . Diabetes Mother   . Bipolar disorder Mother   . CAD Father   The patient's father still living, age 69, in [place]. He had prostate cancer at some point in the past. The patient's mother died at age 18 from complications of diabetes. The patient had no brothers, 2 sisters. A paternal  grandmother had lung cancer in the setting of tobacco abuse. There is no other history of cancer in the family to her knowledge  GYNECOLOGIC HISTORY:  No LMP recorded. Patient has had an ablation. Menarche approximately age 4. First live birth in 46. The patient is GX P2. She underwent endometrial ablation in 2016.  SOCIAL HISTORY:  The patient is originally from [place]. She has lived in [place] before but more recently was in [place]. She is back here because she could not afford her rent in [place]. She is living here and a temporary situation. She is divorced. HER-2 children are [name] who lives in [place] and works as a Development worker, community, and [name] who also lives in [place] and works as a Catering manager. The patient has a grandchild, Arelia Longest, 76 years old as of July 2017, living in [place] with his mother. The patient has not established herself with a local church yet.    ADVANCED DIRECTIVES: Not in place; at the 06/03/2016 visit the patient was given the appropriate forms to complete and notarize at her discretion   HEALTH MAINTENANCE: Social History  Substance Use Topics  . Smoking status: Current Every Day Smoker    Packs/day: 0.50    Types: Cigarettes  . Smokeless tobacco: Never Used  Comment: Pt is on Chantix at present time  . Alcohol use Yes     Comment: no ETOH since 08/22/12     Colonoscopy:  PAP:  Bone density:   Allergies  Allergen Reactions  . Demerol Itching and Nausea And Vomiting  . Erythromycin Rash    Can take zpak    Current Outpatient Prescriptions  Medication Sig Dispense Refill  . cetirizine (ZYRTEC) 10 MG tablet Take 1 tablet (10 mg total) by mouth daily. 30 tablet 11  . dexamethasone (DECADRON) 4 MG tablet Take 1 tablet (4 mg total) by mouth 2 (two) times daily. 60 tablet 0  . diphenoxylate-atropine (LOMOTIL) 2.5-0.025 MG tablet Take 2 tablets by mouth 4 (four) times daily as needed for diarrhea or loose stools. 40 tablet 0  .  fluticasone (FLONASE) 50 MCG/ACT nasal spray Place 1 spray into the nose daily.    [name] gabapentin (NEURONTIN) 300 MG capsule Take 2 capsules (600 mg total) by mouth 3 (three) times daily. 300 mg at night for one day, twice daily for one day, then three times daily 180 capsule 2  . hyaluronate sodium (RADIAPLEXRX) GEL Apply 1 application topically.    [name] HYDROcodone-acetaminophen (NORCO) 7.5-325 MG tablet Take 1-2 tablets by mouth every 6 (six) hours as needed for severe pain (give one tab, then if other tab is needed, may give 30 min later.). 60 tablet 0  . ibuprofen (ADVIL,MOTRIN) 200 MG tablet Take 800 mg by mouth every 6 (six) hours as needed for mild pain.    [name] lamoTRIgine (LAMICTAL) 100 MG tablet Take 1.5 tablets (150 mg total) by mouth 2 (two) times daily. 60 tablet 3  . lidocaine-prilocaine (EMLA) cream Apply 1 application topically as needed. 30 g 0  . lithium carbonate 300 MG capsule Take 1 capsule (300 mg total) by mouth 2 (two) times daily with a meal. 60 capsule 3  . Loperamide HCl (IMODIUM PO) Take 1 tablet by mouth daily as needed (diarhea).    . LORazepam (ATIVAN) 0.5 MG tablet Take 1 tablet (0.5 mg total) by mouth every 8 (eight) hours as needed for anxiety. 30 tablet 1  . ondansetron (ZOFRAN) 8 MG tablet Take 1 tablet (8 mg total) by mouth every 8 (eight) hours as needed for nausea or vomiting. 30 tablet 1  . pantoprazole (PROTONIX) 40 MG tablet Take 1 tablet (40 mg total) by mouth daily. 30 tablet 3  . polyethylene glycol powder (GLYCOLAX/MIRALAX) powder Take 17 g by mouth 2 (two) times daily as needed for mild constipation. (Patient not taking: Reported on 08/13/2016) 3350 g 3  . potassium chloride (K-DUR) 10 MEQ tablet Take 1 tablet (10 mEq total) by mouth daily. 30 tablet 3  . prochlorperazine (COMPAZINE) 10 MG tablet Take 10 mg by mouth daily as needed for nausea.     [name] tiZANidine (ZANAFLEX) 4 MG tablet Take 1 tablet (4 mg total) by mouth daily. 30 tablet 0  . traMADol (ULTRAM) 50  MG tablet Take 1 tablet (50 mg total) by mouth every 6 (six) hours as needed. 60 tablet 0  . trazodone (DESYREL) 300 MG tablet Take 1 tablet (300 mg total) by mouth at bedtime. 30 tablet 3  . triamcinolone cream (KENALOG) 0.5 % Apply 1 application topically daily as needed.  1  . trolamine salicylate (ASPERCREME) 10 % cream Apply 1 application topically as needed for muscle pain. Reported on 06/01/2016    . varenicline (CHANTIX) 0.5 MG tablet Take 1 tablet (0.5 mg total) by  mouth daily. 30 tablet 3  . Vitamin D, Cholecalciferol, 1000 units CAPS Take 1,000 Units by mouth daily.     No current facility-administered medications for this visit.     OBJECTIVE:   Vitals:   03/05/17 0916  BP: (!) 105/59  Pulse: 100  Resp: 18  Temp: 98.2 F (36.8 C)  TempSrc: Oral  SpO2: 99%  Weight: 128 lb 9.6 oz (58.3 kg)  Height: 5' 5"  (1.651 m)  Body mass index is 21.4 kg/m. GENERAL: Patient is a well appearing female in no acute distress HEENT:  Sclerae anicteric.  She has a white coating on her tongue and one small ulceration on her uvula. Neck is supple.  NODES:  No cervical, supraclavicular, or axillary lymphadenopathy palpated.  BREAST EXAM:  Deferred. LUNGS:  Clear to auscultation bilaterally.  No wheezes or rhonchi. HEART:  Regular rate and rhythm. No murmur appreciated. ABDOMEN:  Soft, nontender.  Positive, normoactive bowel sounds. No organomegaly palpated. MSK:  No focal spinal tenderness to palpation. Full range of motion bilaterally in the upper extremities. EXTREMITIES:  No peripheral edema.   SKIN:  Clear with no obvious rashes or skin changes. No nail dyscrasia. NEURO:  Nonfocal. Well oriented.  Appropriate affect.        LAB RESULTS:  CMP     Component Value Date/Time   NA 140 08/13/2016 0926   NA 139 07/28/2016 1118   K 4.3 08/13/2016 0926   K 4.1 07/28/2016 1118   CL 107 08/13/2016 0926   CO2 25 08/13/2016 0926   CO2 26 07/28/2016 1118   GLUCOSE 96 08/13/2016 0926    GLUCOSE 91 07/28/2016 1118   BUN 8 08/13/2016 0926   BUN 8.6 07/28/2016 1118   CREATININE 0.94 08/13/2016 0926   CREATININE 0.8 07/28/2016 1118   CALCIUM 9.2 08/13/2016 0926   CALCIUM 9.7 07/28/2016 1118   PROT 6.7 07/28/2016 1118   ALBUMIN 4.0 07/28/2016 1118   AST 18 07/28/2016 1118   ALT 13 07/28/2016 1118   ALKPHOS 50 07/28/2016 1118   BILITOT <0.30 07/28/2016 1118   GFRNONAA >60 08/13/2016 0926   GFRAA >60 08/13/2016 0926    INo results found for: SPEP, UPEP  Lab Results  Component Value Date   WBC 5.0 08/13/2016   NEUTROABS 4.2 07/28/2016   HGB 12.9 08/13/2016   HCT 40.8 08/13/2016   MCV 96.9 08/13/2016   PLT 171 08/13/2016      Chemistry      Component Value Date/Time   NA 140 08/13/2016 0926   NA 139 07/28/2016 1118   K 4.3 08/13/2016 0926   K 4.1 07/28/2016 1118   CL 107 08/13/2016 0926   CO2 25 08/13/2016 0926   CO2 26 07/28/2016 1118   BUN 8 08/13/2016 0926   BUN 8.6 07/28/2016 1118   CREATININE 0.94 08/13/2016 0926   CREATININE 0.8 07/28/2016 1118      Component Value Date/Time   CALCIUM 9.2 08/13/2016 0926   CALCIUM 9.7 07/28/2016 1118   ALKPHOS 50 07/28/2016 1118   AST 18 07/28/2016 1118   ALT 13 07/28/2016 1118   BILITOT <0.30 07/28/2016 1118       No results found for: LABCA2  No components found for: LABCA125  No results for input(s): INR in the last 168 hours.  Urinalysis    Component Value Date/Time   COLORURINE YELLOW 04/18/2013 1222   APPEARANCEUR CLEAR 04/18/2013 1222   LABSPEC 1.011 04/18/2013 1222   PHURINE 5.5 04/18/2013 1222  GLUCOSEU NEGATIVE 04/18/2013 1222   HGBUR TRACE (A) 04/18/2013 1222   BILIRUBINUR negative 03/28/2015 1321   KETONESUR NEGATIVE 04/18/2013 1222   PROTEINUR negative 03/28/2015 1321   PROTEINUR NEGATIVE 04/18/2013 1222   UROBILINOGEN 0.2 03/28/2015 1321   UROBILINOGEN 0.2 04/18/2013 1222   NITRITE negative 03/28/2015 1321   NITRITE NEGATIVE 04/18/2013 1222   LEUKOCYTESUR Trace 03/28/2015 1321      STUDIES: Dg Chest 2 View  Result Date: 02/25/2017 CLINICAL DATA:  Altered mental status. 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 There is no focal infiltrate. There is no pleural effusion. No pulmonary parenchymal nodules or masses are observed. The heart and pulmonary vascularity are normal. The power port catheter tip projects over the midportion of the SVC. The bony thorax exhibits no acute abnormality. IMPRESSION: There is no pneumonia, CHF, nor other acute cardiopulmonary abnormality. Electronically Signed   By: David  Martinique M.D.   On: 02/25/2017 16:17   Ct Head Wo Contrast  Result Date: 02/25/2017 CLINICAL DATA:  Altered mental status EXAM: CT HEAD WITHOUT CONTRAST TECHNIQUE: Contiguous axial images were obtained from the base of the skull through the vertex without intravenous contrast. COMPARISON:  12/03/2016.  MRI 12/10/2016. FINDINGS: Brain: Postoperative changes in the left occipital region. No acute intracranial abnormality. Specifically, no hemorrhage, hydrocephalus, mass lesion, acute infarction, or significant intracranial injury. Vascular: No hyperdense vessel or unexpected calcification. Skull: No acute calvarial abnormality. Prior craniectomy and mesh placement in the left occipital region. Sinuses/Orbits: Visualized paranasal sinuses and mastoids clear. Orbital soft tissues unremarkable. Other: None IMPRESSION: Postoperative changes in the left occipital region. No acute intracranial abnormality. Electronically Signed   By: Rolm Baptise M.D.   On: 02/25/2017 16:06   Mr Jeri Cos VQ Contrast  Result Date: 03/02/2017 CLINICAL DATA:  Followup metastatic disease to the brain. Several day history of confusion. EXAM: MRI HEAD WITHOUT AND WITH CONTRAST TECHNIQUE: Multiplanar, multiecho pulse sequences of the brain and surrounding structures were obtained without and with  intravenous contrast. CONTRAST:  15m MULTIHANCE GADOBENATE DIMEGLUMINE 529 MG/ML IV SOLN COMPARISON:  CT 02/25/2017.  MRI 12/10/2016. FINDINGS: Brain: Diffusion imaging does not show any acute or subacute infarction or other cause of restricted diffusion. There has been previous left occipital craniotomy for tumor section. No evidence of residual or recurrent disease. Postoperative enhancement continues to diminish. No newly seen metastatic lesion elsewhere. No hemorrhage, hydrocephalus or extra-axial collection. Minimal small-vessel or radiation changes of the white matter as seen previously. Vascular: Major vessels at the base of the brain show flow. Skull and upper cervical spine: Negative Sinuses/Orbits: Clear/normal Other: None IMPRESSION: No worsening or new finding. No explanation for confusion. Previous resection of a left cerebellar metastasis. No evidence of residual or recurrent disease in that location. Diminishing postoperative enhancement. Electronically Signed   By: MNelson ChimesM.D.   On: 03/02/2017 16:31     ELIGIBLE FOR AVAILABLE RESEARCH PROTOCOL: no  ASSESSMENT: 56y.o. [place] woman with stage IV left-sided breast cancer involving bone and central nervous system  (1) s/p left breast lower inner quadrant biopsy 06/19/2015 for a clinical T2-3 NX invasive ductal carcinoma, grade 2, triple positive.  (2) status post left mastectomy and axillary lymph node dissection  with immediate expander placement 07/18/2015 for an mpT4 pN2,stage IIIB invasive ductal carcinoma, grade 3, with negative margins.  (a) definitive implant exchange to be scheduled in December   METASTATIC  DISEASE: October 2016  (3) CT scan of the chest abdomen and pelvis  09/16/2015 shows metastatic lesions in the right scapula, left iliac crest, L4, and T spine. There were questionable liver cysts, with repeat CT scan 03/02/2016 showing possible right upper lobe lung lesions and possibly increased liver lesions  (a)  CT scan of the chest 06/17/2016 shows no active disease in the lungs or liver  (b) Bone scan July 2017 showed no evidence of bony metastatic disease   (c) head CT 07/08/2016 showed a cerebellar lesion, confirmed by MRI 07/23/2016, status post craniotomy 08/14/2016, confirming a metastatic deposit which was estrogen and progesterone receptor negative, HER-2 amplified with a signals ratio of 7.16, number per cell 13.25  (d) CA 27-29 not informative  (4) received docetaxel every 3 weeks 6 together with trastuzumab and pertuzumab, last docetaxel dose 02/11/2016  (5) adjuvant radiation7/03/2016 to 06/26/2016 at [facility]: 1. The Left chest wall was treated to 23.4 Gy in 13 fractions at 1.8 Gy per fraction. 2. The Left chest wall was boosted to 10 Gy in 5 fractions at 2 Gy per fraction. 3. The Left Sclav/PAB was treated to 23.4 Gy in 13 fractions at 1.8 Gy per fraction.  Including the patient's treatment in [place] (received 15 fractions per Dr. Maryan Rued near [place], [place]), the patient received 50.4 Gy to the left chest wall and supraclavicular region.   (6) started trastuzumab and pertuzumab October 2016, continuing every 3 weeks,  (a) echocardiogram 02/26/2016 showed a well preserved ejection fraction  (b) echocardiogram 07/01/2016 shows an ejection fraction in the 60-65%   (c) pertuzumab discontinued 08/25/2016 with uncontrolled diarrhea  (d) echocardiogram 11/11/2016 showed an ejection fraction in the 60-65%  (7) started denosumab/Xgeva October 2017, repeated every 6 weeks  (8) started anastrozole October 2017   (a) bone scan 11/10/2016 shows no active disease  (b) chest CT scan 11/10/2016 stable, with no evidence of active disease  (9) history of bipolar disorder  (a) lithium level checked every 6 weeks  (10) mild anemia with a significant drop in the MCV, ferritin 10 06/03/2016,   (a) Feraheme given 06/12/2016 and 06/18/2016  (11) tobacco abuse: Chantix started 06/18/2016     (12) 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  (a) brain irradiation10/19/17-11/08/17: Whole brain/ 35 Gy in 14 fractions   (b) repeat brain MRI obtained 12/10/2016 shows no active disease in the brain   PLAN: [name] is doing well today.  Her CBC and CMET are normal and I reviewed those with her in detail today.  Her echo on 4/4 was normal and I reviewed it with her as well.  She will proceed with Trastuzumab.  She will undergo scans on 4/9 and f/u with Dr. [name] on 4/27.  She wants to see him at that time.  I will refill her Tramadol and send her in some magic mouthwash.  I recommended she follow up with psychiatry and take all of her medications as directed.  She verbalized understanding.    She knows to call us if she has any questions or concerns prior to her next appointment.    A total of (30) minutes of face-to-face time was spent with this patient with greater than 50% of that time in counseling and care-coordination.    Scot Dock, NP Medical Oncology and Hematology [facility] [address] [place],  [address] Tel. [phone]    Fax. [phone]

## 2017-03-06 LAB — CANCER ANTIGEN 27.29: CA 27.29: 13.4 U/mL (ref 0.0–38.6)

## 2017-03-06 LAB — LITHIUM LEVEL: Lithium: 1.1 mmol/L (ref 0.6–1.2)

## 2017-03-08 ENCOUNTER — Encounter (HOSPITAL_COMMUNITY): Payer: Medicaid Other

## 2017-03-08 ENCOUNTER — Ambulatory Visit (HOSPITAL_COMMUNITY): Payer: Medicaid Other

## 2017-03-09 ENCOUNTER — Telehealth: Payer: Self-pay | Admitting: Family Medicine

## 2017-03-09 NOTE — Telephone Encounter (Signed)
Cherryl from [facility] to inform PCP that pt [name]) fell last night and pt now has a black eye. Pt also bruised her chin and neck. Pt refused to call EMS or schedule appt with PCP. Pt's vital signs are:  bp 100/60, temp 97.5 and heart rate 80. Respiration was 16. Pt seemed to be fine. This isn't the first time patient has fallen.  Thank you.

## 2017-03-10 ENCOUNTER — Telehealth: Payer: Self-pay | Admitting: *Deleted

## 2017-03-10 ENCOUNTER — Encounter: Payer: Self-pay | Admitting: *Deleted

## 2017-03-10 NOTE — Telephone Encounter (Signed)
Pt states she still has a black eye but her eye looks 100% better. " I'm OK, if I wasn't I would've went to the ER." Pt is not rude or disrespectful in saying this to the writer. Informed of Dr. Johna Sheriff recommendation. She states she has a f/u for imaging on Friday. She does not wish to go to ED.

## 2017-03-10 NOTE — Progress Notes (Signed)
[place] Work  Clinical Social Work was referred by patient for assistance with transportation.  Pt requesting help with transportation to appts for this Friday. Clinical Social Worker contacted [employer] on behalf of pt. Pt has not been set up for [facility] transportation to date and CSW made initial request. CSW inquired with pt via phone at length to assess other needs. Pt denied wanting other help currently and just wanted ride to appointments. This CSW has attempted to help pt with CNA referral, ALF and other caregiver support, but pt has declined on multiple occassions. Pt continues to struggle with follow up to referrals and community service providers. [facility] RN is following and pt reports they are to visit tomorrow. CSW continues to be concerned for pt, but this situation is complex in that pt at times is very aware of her needs and in control and other times the opposite is very true. CSW feels [facility] would decline case as pt is not in immediate danger or in unsafe situation and is competent to decline services. CSW to follow closely.   Clinical Social Work interventions:  Pt advocacy Loren Racer, LCSW, OSW-C Clinical Social Worker [place]  [place] Phone: [phone] Fax: [phone]

## 2017-03-10 NOTE — Telephone Encounter (Signed)
Message left by pt on VM in [place] at 1245 / forwarded to this RN.  Per message pt stated " please have Mateo Flow call me "  Call returned and obtained VM- message left stating RN returning call.

## 2017-03-10 NOTE — Telephone Encounter (Signed)
Left message on voicemail: Carilyn Goodpasture, nurse from [facility], calling back in reference to message received from [place] from [facility]  About fall. Recommended to go to the ED. Informed will call back to ensure message was received on voicemail.

## 2017-03-10 NOTE — Telephone Encounter (Signed)
I would recommend she go to the ED

## 2017-03-11 ENCOUNTER — Telehealth: Payer: Self-pay | Admitting: Family Medicine

## 2017-03-11 ENCOUNTER — Ambulatory Visit: Admit: 2017-03-11 | Payer: Medicaid Other

## 2017-03-11 ENCOUNTER — Ambulatory Visit (HOSPITAL_COMMUNITY): Payer: Medicaid Other

## 2017-03-11 ENCOUNTER — Telehealth: Payer: Self-pay | Admitting: *Deleted

## 2017-03-11 ENCOUNTER — Encounter: Payer: Self-pay | Admitting: *Deleted

## 2017-03-11 SURGERY — RADIOLOGY WITH ANESTHESIA
Anesthesia: General

## 2017-03-11 NOTE — Telephone Encounter (Signed)
Dys[place]ka, Education officer, museum from [employer], called to get verbal orders from PCP for 1 home visit and a PRN. Due to patient's tumor, there's been delay in patient's cognitive skills. Pt doesn't have family to care for her. Please follow up.  Thank you.

## 2017-03-11 NOTE — Telephone Encounter (Signed)
"  I am for scans tomorrow at 0800.  I do not know if I can lie on the table.  I have got the shakes real bad.  I do not have anything for the shakes."  Call lost.  Message sent to collaborative and provider.

## 2017-03-11 NOTE — Progress Notes (Signed)
[place] Work  Clinical Social Work staff was attempting to locate copy of ADRs/HCPOA in chart as this was completed months ago and no copy was locatable in pt's chart. CSW contacted medical records and copy appears now visible in chart. The patient designated son, Mette Southgate as their primary healthcare agent and son, Opaline Reyburn as their secondary agent.  Patient also completed healthcare living will.    Clinical Social Worker contacted pt and pt agrees to [place] sending documents to family as pt has lost their copy of documents. CSW also attempting to assist with transportation to appointments. Medicaid has not completed their assessment to date and recently changed their policy to a three day notice where it was a 48 hour notice prior. CSW attempting to locate other transportation resources for pt, but there appears to be no options at this short notice. This CSW did assist pt with SCAT months ago, but pt has not accessed to date. CSW to follow and assist.   *This CSW has been in contact with [facility] RN/SW team and they are attempting to get orders for [facility] SW to do home visit to further assess needs. This CSW continues to have concerns that this pt needs higher level of care than independent living. PCS referral was placed weeks ago for CNA help at home with assistance through PCP office. This CSW is not sure if pt has connected with this resource or not. Pt appears to have difficulty with memory, keeping up with appointments, contacts, paperwork and medications. CSW to follow and assist.    Loren Racer, LCSW, OSW-C Clinical Social Worker [place]  [facility] Phone: [phone] Fax: [phone]

## 2017-03-12 ENCOUNTER — Encounter (HOSPITAL_COMMUNITY): Payer: Medicaid Other

## 2017-03-12 NOTE — Telephone Encounter (Signed)
Okay to give verbal order.

## 2017-03-15 ENCOUNTER — Inpatient Hospital Stay
Admission: RE | Admit: 2017-03-15 | Discharge: 2017-03-15 | Disposition: A | Discharge status: Home / Self Care | Payer: Self-pay | Source: Ambulatory Visit | Attending: Radiation Oncology | Admitting: Radiation Oncology

## 2017-03-16 ENCOUNTER — Other Ambulatory Visit: Payer: Self-pay

## 2017-03-16 ENCOUNTER — Encounter (HOSPITAL_COMMUNITY): Payer: Self-pay | Admitting: Family Medicine

## 2017-03-16 ENCOUNTER — Emergency Department (HOSPITAL_COMMUNITY): Payer: Medicaid Other

## 2017-03-16 ENCOUNTER — Inpatient Hospital Stay (HOSPITAL_COMMUNITY)
Admission: EM | Admit: 2017-03-16 | Discharge: 2017-04-07 | DRG: 091 | Disposition: A | Discharge status: Skilled Nursing Facility | Payer: Medicaid Other | Attending: Internal Medicine | Admitting: Internal Medicine

## 2017-03-16 DIAGNOSIS — F3130 Bipolar disorder, current episode depressed, mild or moderate severity, unspecified: Secondary | ICD-10-CM | POA: Diagnosis present

## 2017-03-16 DIAGNOSIS — C7931 Secondary malignant neoplasm of brain: Secondary | ICD-10-CM

## 2017-03-16 DIAGNOSIS — Z8249 Family history of ischemic heart disease and other diseases of the circulatory system: Secondary | ICD-10-CM

## 2017-03-16 DIAGNOSIS — C50312 Malignant neoplasm of lower-inner quadrant of left female breast: Secondary | ICD-10-CM

## 2017-03-16 DIAGNOSIS — J029 Acute pharyngitis, unspecified: Secondary | ICD-10-CM | POA: Diagnosis not present

## 2017-03-16 DIAGNOSIS — Z79811 Long term (current) use of aromatase inhibitors: Secondary | ICD-10-CM

## 2017-03-16 DIAGNOSIS — C50919 Malignant neoplasm of unspecified site of unspecified female breast: Secondary | ICD-10-CM | POA: Diagnosis not present

## 2017-03-16 DIAGNOSIS — F17210 Nicotine dependence, cigarettes, uncomplicated: Secondary | ICD-10-CM | POA: Diagnosis present

## 2017-03-16 DIAGNOSIS — Z853 Personal history of malignant neoplasm of breast: Secondary | ICD-10-CM

## 2017-03-16 DIAGNOSIS — G9349 Other encephalopathy: Secondary | ICD-10-CM | POA: Diagnosis present

## 2017-03-16 DIAGNOSIS — Z8589 Personal history of malignant neoplasm of other organs and systems: Secondary | ICD-10-CM | POA: Diagnosis present

## 2017-03-16 DIAGNOSIS — Z85841 Personal history of malignant neoplasm of brain: Secondary | ICD-10-CM

## 2017-03-16 DIAGNOSIS — Z833 Family history of diabetes mellitus: Secondary | ICD-10-CM

## 2017-03-16 DIAGNOSIS — R410 Disorientation, unspecified: Secondary | ICD-10-CM

## 2017-03-16 DIAGNOSIS — Z79899 Other long term (current) drug therapy: Secondary | ICD-10-CM

## 2017-03-16 DIAGNOSIS — Z818 Family history of other mental and behavioral disorders: Secondary | ICD-10-CM

## 2017-03-16 DIAGNOSIS — K219 Gastro-esophageal reflux disease without esophagitis: Secondary | ICD-10-CM | POA: Diagnosis present

## 2017-03-16 DIAGNOSIS — N3000 Acute cystitis without hematuria: Secondary | ICD-10-CM | POA: Diagnosis present

## 2017-03-16 DIAGNOSIS — F319 Bipolar disorder, unspecified: Secondary | ICD-10-CM | POA: Diagnosis present

## 2017-03-16 DIAGNOSIS — G9782 Other postprocedural complications and disorders of nervous system: Principal | ICD-10-CM | POA: Diagnosis present

## 2017-03-16 DIAGNOSIS — T43595A Adverse effect of other antipsychotics and neuroleptics, initial encounter: Secondary | ICD-10-CM | POA: Diagnosis present

## 2017-03-16 DIAGNOSIS — M797 Fibromyalgia: Secondary | ICD-10-CM | POA: Diagnosis present

## 2017-03-16 DIAGNOSIS — G9340 Encephalopathy, unspecified: Secondary | ICD-10-CM | POA: Diagnosis not present

## 2017-03-16 DIAGNOSIS — Z9012 Acquired absence of left breast and nipple: Secondary | ICD-10-CM

## 2017-03-16 DIAGNOSIS — D630 Anemia in neoplastic disease: Secondary | ICD-10-CM | POA: Diagnosis present

## 2017-03-16 DIAGNOSIS — N390 Urinary tract infection, site not specified: Secondary | ICD-10-CM

## 2017-03-16 DIAGNOSIS — Y838 Other surgical procedures as the cause of abnormal reaction of the patient, or of later complication, without mention of misadventure at the time of the procedure: Secondary | ICD-10-CM | POA: Diagnosis present

## 2017-03-16 DIAGNOSIS — D72819 Decreased white blood cell count, unspecified: Secondary | ICD-10-CM | POA: Diagnosis present

## 2017-03-16 DIAGNOSIS — Z923 Personal history of irradiation: Secondary | ICD-10-CM

## 2017-03-16 DIAGNOSIS — G251 Drug-induced tremor: Secondary | ICD-10-CM | POA: Diagnosis present

## 2017-03-16 LAB — CBC WITH DIFFERENTIAL/PLATELET
Basophils Absolute: 0 10*3/uL (ref 0.0–0.1)
Basophils Relative: 0 %
Eosinophils Absolute: 0.1 10*3/uL (ref 0.0–0.7)
Eosinophils Relative: 2 %
HCT: 36.3 % (ref 36.0–46.0)
Hemoglobin: 11.5 g/dL — ABNORMAL LOW (ref 12.0–15.0)
Lymphocytes Relative: 22 %
Lymphs Abs: 1 10*3/uL (ref 0.7–4.0)
MCH: 30.7 pg (ref 26.0–34.0)
MCHC: 31.7 g/dL (ref 30.0–36.0)
MCV: 96.8 fL (ref 78.0–100.0)
Monocytes Absolute: 0.4 10*3/uL (ref 0.1–1.0)
Monocytes Relative: 8 %
Neutro Abs: 3.1 10*3/uL (ref 1.7–7.7)
Neutrophils Relative %: 68 %
Platelets: 180 10*3/uL (ref 150–400)
RBC: 3.75 MIL/uL — ABNORMAL LOW (ref 3.87–5.11)
RDW: 12.7 % (ref 11.5–15.5)
WBC: 4.5 10*3/uL (ref 4.0–10.5)

## 2017-03-16 LAB — COMPREHENSIVE METABOLIC PANEL
ALT: 11 U/L — ABNORMAL LOW (ref 14–54)
AST: 15 U/L (ref 15–41)
Albumin: 3.8 g/dL (ref 3.5–5.0)
Alkaline Phosphatase: 34 U/L — ABNORMAL LOW (ref 38–126)
Anion gap: 10 (ref 5–15)
BUN: 5 mg/dL — ABNORMAL LOW (ref 6–20)
CO2: 24 mmol/L (ref 22–32)
Calcium: 9.8 mg/dL (ref 8.9–10.3)
Chloride: 105 mmol/L (ref 101–111)
Creatinine, Ser: 0.79 mg/dL (ref 0.44–1.00)
GFR calc Af Amer: >60 mL/min (ref >60)
GFR calc non Af Amer: >60 mL/min (ref >60)
Glucose, Bld: 96 mg/dL (ref 65–99)
Potassium: 3.9 mmol/L (ref 3.5–5.1)
Sodium: 139 mmol/L (ref 135–145)
Total Bilirubin: 0.5 mg/dL (ref 0.3–1.2)
Total Protein: 5.5 g/dL — ABNORMAL LOW (ref 6.5–8.1)

## 2017-03-16 LAB — RAPID URINE DRUG SCREEN, HOSP PERFORMED
Amphetamines: NOT DETECTED
Barbiturates: NOT DETECTED
Benzodiazepines: NOT DETECTED
Cocaine: NOT DETECTED
Opiates: NOT DETECTED
Tetrahydrocannabinol: NOT DETECTED

## 2017-03-16 LAB — SALICYLATE LEVEL: Salicylate Lvl: <7 mg/dL (ref 2.8–30.0)

## 2017-03-16 LAB — URINALYSIS, ROUTINE W REFLEX MICROSCOPIC
Bacteria, UA: NONE SEEN
Bilirubin Urine: NEGATIVE
Glucose, UA: NEGATIVE mg/dL
Ketones, ur: 20 mg/dL — AB
Nitrite: NEGATIVE
Protein, ur: NEGATIVE mg/dL
Specific Gravity, Urine: 1.009 (ref 1.005–1.030)
pH: 6 (ref 5.0–8.0)

## 2017-03-16 LAB — I-STAT TROPONIN, ED: Troponin i, poc: 0 ng/mL (ref 0.00–0.08)

## 2017-03-16 LAB — LITHIUM LEVEL: Lithium Lvl: 1.2 mmol/L (ref 0.60–1.20)

## 2017-03-16 LAB — AMMONIA: Ammonia: 16 umol/L (ref 9–35)

## 2017-03-16 LAB — ACETAMINOPHEN LEVEL: Acetaminophen (Tylenol), Serum: <10 ug/mL — ABNORMAL LOW (ref 10–30)

## 2017-03-16 LAB — ETHANOL: Alcohol, Ethyl (B): <5 mg/dL (ref <5)

## 2017-03-16 MED ORDER — LORAZEPAM 2 MG/ML IJ SOLN
1.0000 mg | Freq: Once | INTRAMUSCULAR | Status: AC
  Administered 2017-03-16: 1 mg via INTRAVENOUS
  Filled 2017-03-16: qty 1

## 2017-03-16 MED ORDER — QUETIAPINE FUMARATE 50 MG PO TABS
50.0000 mg | ORAL_TABLET | Freq: Two times a day (BID) | ORAL | Status: DC
  Administered 2017-03-16 – 2017-03-18 (×4): 50 mg via ORAL
  Filled 2017-03-16 (×4): qty 1

## 2017-03-16 MED ORDER — LORATADINE 10 MG PO TABS
10.0000 mg | ORAL_TABLET | Freq: Every day | ORAL | Status: DC
  Administered 2017-03-17 – 2017-04-07 (×21): 10 mg via ORAL
  Filled 2017-03-16 (×22): qty 1

## 2017-03-16 MED ORDER — DEXTROSE 5 % IV SOLN
1.0000 g | INTRAVENOUS | Status: DC
  Administered 2017-03-18: 1 g via INTRAVENOUS
  Filled 2017-03-16 (×2): qty 10

## 2017-03-16 MED ORDER — LITHIUM CARBONATE 300 MG PO CAPS
300.0000 mg | ORAL_CAPSULE | Freq: Two times a day (BID) | ORAL | Status: DC
  Administered 2017-03-17 – 2017-03-18 (×3): 300 mg via ORAL
  Filled 2017-03-16 (×5): qty 1

## 2017-03-16 MED ORDER — ANASTROZOLE 1 MG PO TABS
1.0000 mg | ORAL_TABLET | Freq: Every day | ORAL | Status: DC
  Administered 2017-03-17 – 2017-04-07 (×21): 1 mg via ORAL
  Filled 2017-03-16 (×23): qty 1

## 2017-03-16 MED ORDER — SODIUM CHLORIDE 0.9% FLUSH
3.0000 mL | INTRAVENOUS | Status: DC | PRN
  Administered 2017-04-04: 3 mL via INTRAVENOUS
  Filled 2017-03-16: qty 3

## 2017-03-16 MED ORDER — GABAPENTIN 300 MG PO CAPS
600.0000 mg | ORAL_CAPSULE | Freq: Three times a day (TID) | ORAL | Status: DC
  Administered 2017-03-16 – 2017-03-18 (×7): 600 mg via ORAL
  Filled 2017-03-16 (×8): qty 2

## 2017-03-16 MED ORDER — SODIUM CHLORIDE 0.9 % IV BOLUS (SEPSIS)
1000.0000 mL | Freq: Once | INTRAVENOUS | Status: AC
  Administered 2017-03-16: 1000 mL via INTRAVENOUS

## 2017-03-16 MED ORDER — SODIUM CHLORIDE 0.9 % IV SOLN: 250.0000 mL | INTRAVENOUS | Status: DC | PRN

## 2017-03-16 MED ORDER — ENOXAPARIN SODIUM 40 MG/0.4ML ~~LOC~~ SOLN
40.0000 mg | SUBCUTANEOUS | Status: DC
  Administered 2017-03-17 – 2017-04-05 (×19): 40 mg via SUBCUTANEOUS
  Filled 2017-03-16 (×18): qty 0.4

## 2017-03-16 MED ORDER — PANTOPRAZOLE SODIUM 40 MG PO TBEC
40.0000 mg | DELAYED_RELEASE_TABLET | Freq: Every day | ORAL | Status: DC
  Administered 2017-03-17 – 2017-04-07 (×21): 40 mg via ORAL
  Filled 2017-03-16 (×22): qty 1

## 2017-03-16 MED ORDER — LIDOCAINE-PRILOCAINE 2.5-2.5 % EX CREA: 1.0000 "application " | TOPICAL_CREAM | CUTANEOUS | Status: DC | PRN

## 2017-03-16 MED ORDER — SODIUM CHLORIDE 0.9% FLUSH
3.0000 mL | Freq: Two times a day (BID) | INTRAVENOUS | Status: DC
  Administered 2017-03-16 – 2017-03-18 (×3): 3 mL via INTRAVENOUS
  Administered 2017-03-18: 02:00:00 via INTRAVENOUS
  Administered 2017-03-22 – 2017-04-06 (×9): 3 mL via INTRAVENOUS

## 2017-03-16 MED ORDER — CEFTRIAXONE SODIUM 1 G IJ SOLR
1.0000 g | Freq: Once | INTRAMUSCULAR | Status: AC
  Administered 2017-03-16: 1 g via INTRAVENOUS
  Filled 2017-03-16: qty 10

## 2017-03-16 MED ORDER — POTASSIUM CHLORIDE CRYS ER 20 MEQ PO TBCR
10.0000 meq | EXTENDED_RELEASE_TABLET | Freq: Every day | ORAL | Status: DC
  Administered 2017-03-17 – 2017-04-07 (×21): 10 meq via ORAL
  Filled 2017-03-16 (×22): qty 1

## 2017-03-16 MED ORDER — TRAMADOL HCL 50 MG PO TABS
50.0000 mg | ORAL_TABLET | Freq: Two times a day (BID) | ORAL | Status: DC | PRN
  Administered 2017-03-18 – 2017-04-06 (×20): 50 mg via ORAL
  Filled 2017-03-16 (×24): qty 1

## 2017-03-16 MED ORDER — IBUPROFEN 800 MG PO TABS
800.0000 mg | ORAL_TABLET | Freq: Four times a day (QID) | ORAL | Status: DC | PRN
  Administered 2017-03-18 – 2017-04-06 (×14): 800 mg via ORAL
  Filled 2017-03-16 (×2): qty 2
  Filled 2017-03-16: qty 1
  Filled 2017-03-16: qty 2
  Filled 2017-03-16: qty 1
  Filled 2017-03-16 (×3): qty 2
  Filled 2017-03-16: qty 1
  Filled 2017-03-16 (×2): qty 2
  Filled 2017-03-16 (×4): qty 1
  Filled 2017-03-16 (×2): qty 2
  Filled 2017-03-16 (×6): qty 1
  Filled 2017-03-16 (×2): qty 2
  Filled 2017-03-16: qty 1
  Filled 2017-03-16: qty 2
  Filled 2017-03-16: qty 1

## 2017-03-16 MED ORDER — MAGIC MOUTHWASH: 5.0000 mL | Freq: Four times a day (QID) | ORAL | Status: DC | PRN

## 2017-03-16 MED ORDER — LAMOTRIGINE 150 MG PO TABS
150.0000 mg | ORAL_TABLET | Freq: Every day | ORAL | Status: DC
  Administered 2017-03-17 – 2017-03-23 (×7): 150 mg via ORAL
  Filled 2017-03-16 (×8): qty 1

## 2017-03-16 MED ORDER — TRAZODONE HCL 100 MG PO TABS
200.0000 mg | ORAL_TABLET | Freq: Every day | ORAL | Status: DC
  Administered 2017-03-16 – 2017-04-06 (×21): 200 mg via ORAL
  Filled 2017-03-16: qty 2
  Filled 2017-03-16: qty 4
  Filled 2017-03-16: qty 2
  Filled 2017-03-16 (×16): qty 4
  Filled 2017-03-16: qty 2
  Filled 2017-03-16 (×3): qty 4
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
## 2017-03-16 NOTE — ED Notes (Signed)
Pt currently resting will attempt to get urine when pt awakes

## 2017-03-16 NOTE — ED Notes (Signed)
Patient transported to CT 

## 2017-03-16 NOTE — H&P (Signed)
History and Physical    [name] EPP:[id number] DOB: 30-May-1961 DOA: 03/16/2017  PCP: Shannon Morale, MD   [name] coming from: Home  Chief Complaint: Confusion, hallucinations   HPI: [name] is a 56 y.o. 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 [name] was just discharged from the hospital on 03/01/2017 after a similar presentation for which a specific etiology was not identified. [name] had reportedly done well for about a week back at home with home health, but over the past several days, has been noted by her roommate to be increasingly confused and disoriented with apparent visual hallucinations and labile mood. [name] has not voiced any specific complaint and has not been noted to have a cough or be vomiting. [name] is not known to be using alcohol or illicit substances in recent years. [name] underwent craniectomy in September 2017 and has had numerous ED visits and hospital admission for confusion since that time. [name] has been evaluated by psychiatry for this previously in both inpatient and outpatient settings, has undergone laboratory evaluation with normal TSH and B12 levels earlier this year. MRI brain was stable with no apparent etiology for [name]'s symptoms on 03/02/2017. [name] has difficulty providing much of a history at this time.  ED Course: Upon arrival to the ED, [name] is found to be afebrile, saturating well on room air, and with vital signs stable. EKG features a sinus rhythm with significant artifact and chest x-rays negative for acute cardiopulmonary disease. Noncontrast head CT is negative for definite evidence for acute intracranial abnormality and there is stable postsurgical changes noted. Chemistry panel is  unremarkable and CBC is notable for a slight normocytic anemia. Troponin is undetectable and urinalysis is suggestive of possible infection. [name] was treated with 1 L of normal saline, 1 mg IV Ativan, 1 g IV Rocephin, and a psychiatry consultation was requested by the ED physician. [name] remained hemodynamically stable in the ED and in no apparent respiratory distress, but is confused and actively hallucinating. [name] will be observed on the medical-surgical unit for ongoing evaluation and management of acute encephalopathy, with possible contribution from acute cystitis which will be treated with empiric Rocephin while awaiting culture data.  Review of Systems:  Unable to obtain complete ROS secondary to [name]'s clinical condition with acute encephalopathy  Past Medical History:  Diagnosis Date  . Alcohol abuse   . Anemia    during chemo  . Anxiety    At age 71  . Arthritis Dx 2010  . Bipolar disorder ([place])   . Cancer ([place])    breast mets to brain  . Chronic pain   . Complication of anesthesia   . Depression   . Fibromyalgia Dx 2005  . GERD (gastroesophageal reflux disease)   . Headache    hx  migraines  . Opiate dependence ([employer])   . PONV (postoperative nausea and vomiting)   . Port-a-cath in place   . PTSD (post-traumatic stress disorder)     Past Surgical History:  Procedure Laterality Date  . APPLICATION OF CRANIAL NAVIGATION N/A 08/14/2016   Procedure: APPLICATION OF CRANIAL NAVIGATION;  Surgeon: Erline Levine, MD;  Location: [facility];  Service: Neurosurgery;  Laterality: N/A;  . BREAST RECONSTRUCTION Left    with silicone implant  . CRANIOTOMY N/A 08/14/2016  Procedure: CRANIOTOMY TUMOR EXCISION WITH Lucky Rathke;  Surgeon: Erline Levine, MD;  Location: [facility];  Service: Neurosurgery;  Laterality: N/A;  . FIBULA FRACTURE SURGERY Left   . MASTECTOMY Left   . RADIOLOGY WITH ANESTHESIA N/A 07/23/2016   Procedure: MRI OF BRAIN WITH AND WITHOUT;  Surgeon: Medication  Radiologist, MD;  Location: [facility];  Service: Radiology;  Laterality: N/A;  . RADIOLOGY WITH ANESTHESIA N/A 09/08/2016   Procedure: MRI OF BRAIN WITH AND WITHOUT CONTRAST;  Surgeon: Medication Radiologist, MD;  Location: [place];  Service: Radiology;  Laterality: N/A;  . RADIOLOGY WITH ANESTHESIA N/A 12/10/2016   Procedure: MRI OF BRAIN WITH AND WITHOUT;  Surgeon: Medication Radiologist, MD;  Location: [facility];  Service: Radiology;  Laterality: N/A;  . RADIOLOGY WITH ANESTHESIA N/A 03/02/2017   Procedure: MRI of BRAIN W and W/OUT CONTRAST;  Surgeon: Medication Radiologist, MD;  Location: [facility];  Service: Radiology;  Laterality: N/A;  . right power port placement Right      reports that [name] has been smoking Cigarettes.  [name] has been smoking about 0.25 packs per day. [name] has never used smokeless tobacco. [name] reports that [name] does not drink alcohol or use drugs.  Allergies  Allergen Reactions  . Demerol Itching and Nausea And Vomiting  . Erythromycin Rash    Family History  Problem Relation Age of Onset  . Diabetes Mother   . Bipolar disorder Mother   . CAD Father      Prior to Admission medications   Medication Sig Start Date End Date Taking? Authorizing Provider  anastrozole (ARIMIDEX) 1 MG tablet Take 1 tablet (1 mg total) by mouth daily. 08/25/16   Chauncey Cruel, MD  Biotin 1 MG CAPS Take 1 mg by mouth daily.    Historical Provider, MD  cetirizine (ZYRTEC) 10 MG tablet Take 1 tablet (10 mg total) by mouth daily. 07/14/16   Chauncey Cruel, MD  diphenhydrAMINE (BENADRYL) 25 MG tablet Take 25 mg by mouth every 6 (six) hours as needed for allergies.    Historical Provider, MD  gabapentin (NEURONTIN) 300 MG capsule Take 600 mg by mouth 3 (three) times daily.    Historical Provider, MD  ibuprofen (ADVIL,MOTRIN) 200 MG tablet Take 800 mg by mouth every 6 (six) hours as needed for headache, mild pain or moderate pain.     Historical Provider, MD  lamoTRIgine (LAMICTAL) 100 MG tablet Take 1.5  tablets (150 mg total) by mouth daily. 03/03/17   Dron Tanna Furry, MD  lidocaine-prilocaine (EMLA) cream Apply 1 application topically as needed (prior to accessing port).    Historical Provider, MD  lithium carbonate 300 MG capsule Take 300 mg by mouth 2 (two) times daily with a meal.    Historical Provider, MD  magic mouthwash SOLN Take 5 mLs by mouth 4 (four) times daily as needed for mouth pain. 03/05/17   Gardenia Phlegm, NP  pantoprazole (PROTONIX) 40 MG tablet Take 40 mg by mouth daily.    Historical Provider, MD  potassium chloride (MICRO-K) 10 MEQ CR capsule Take 1 capsule (10 mEq total) by mouth daily. 02/18/17   Shannon Morale, MD  QUEtiapine (SEROQUEL) 50 MG tablet Take 1 tablet (50 mg total) by mouth 2 (two) times daily. 03/03/17   Dron Tanna Furry, MD  traMADol (ULTRAM) 50 MG tablet Take 1 tablet (50 mg total) by mouth every 12 (twelve) hours as needed. 03/05/17   Gardenia Phlegm, NP  traZODone (DESYREL) 100 MG tablet Take  200 mg by mouth at bedtime.     Historical Provider, MD    Physical Exam: Vitals:   03/16/17 1800 03/16/17 1815 03/16/17 1830 03/16/17 2003  BP: (!) 91/45  104/60   Pulse:  84 85   Resp: 17 19 12    Temp:    97.8 F (36.6 C)  TempSrc:    Oral  SpO2:  97% 99%       Constitutional: No respiratory distress, appears uncomfortable, anxious  Eyes: PERTLA, lids and conjunctivae normal. Left periorbital ecchymosis.  ENMT: Mucous membranes are moist. Posterior pharynx clear of any exudate or lesions.   Neck: normal, supple, no masses, no thyromegaly Respiratory: clear to auscultation bilaterally, no wheezing, no crackles. Normal respiratory effort.    Cardiovascular: S1 & S2 heard, regular rate and rhythm. No carotid bruits. No significant JVD. Abdomen: No distension, no tenderness, no masses palpated. Bowel sounds normal.  Musculoskeletal: no clubbing / cyanosis. No joint deformity upper and lower extremities.   Skin: no significant rashes,  lesions, ulcers. Warm, dry, well-perfused. Neurologic: CN 2-12 grossly intact. Sensation intact, DTR normal. Strength 5/5 in all 4 limbs. Resting tremor to bilateral UE's.  Psychiatric: Alert, and oriented to person and place. Not oriented to situation, hallucinating, confused.      Labs on Admission: I have personally reviewed following labs and imaging studies  CBC:  Recent Labs Lab 03/16/17 1630  WBC 4.5  NEUTROABS 3.1  HGB 11.5*  HCT 36.3  MCV 96.8  PLT 440   Basic Metabolic Panel:  Recent Labs Lab 03/16/17 1630  NA 139  K 3.9  CL 105  CO2 24  GLUCOSE 96  BUN 5*  CREATININE 0.79  CALCIUM 9.8   GFR: Estimated Creatinine Clearance: 71.5 mL/min (by C-G formula based on SCr of 0.79 mg/dL). Liver Function Tests:  Recent Labs Lab 03/16/17 1630  AST 15  ALT 11*  ALKPHOS 34*  BILITOT 0.5  PROT 5.5*  ALBUMIN 3.8   No results for input(s): LIPASE, AMYLASE in the last 168 hours. No results for input(s): AMMONIA in the last 168 hours. Coagulation Profile: No results for input(s): INR, PROTIME in the last 168 hours. Cardiac Enzymes: No results for input(s): CKTOTAL, CKMB, CKMBINDEX, TROPONINI in the last 168 hours. BNP (last 3 results) No results for input(s): PROBNP in the last 8760 hours. HbA1C: No results for input(s): HGBA1C in the last 72 hours. CBG: No results for input(s): GLUCAP in the last 168 hours. Lipid Profile: No results for input(s): CHOL, HDL, LDLCALC, TRIG, CHOLHDL, LDLDIRECT in the last 72 hours. Thyroid Function Tests: No results for input(s): TSH, T4TOTAL, FREET4, T3FREE, THYROIDAB in the last 72 hours. Anemia Panel: No results for input(s): VITAMINB12, FOLATE, FERRITIN, TIBC, IRON, RETICCTPCT in the last 72 hours. Urine analysis:    Component Value Date/Time   COLORURINE YELLOW 03/16/2017 1915   APPEARANCEUR CLEAR 03/16/2017 1915   LABSPEC 1.009 03/16/2017 1915   PHURINE 6.0 03/16/2017 1915   GLUCOSEU NEGATIVE 03/16/2017 1915    HGBUR SMALL (A) 03/16/2017 1915   BILIRUBINUR NEGATIVE 03/16/2017 1915   BILIRUBINUR negative 03/28/2015 1321   KETONESUR 20 (A) 03/16/2017 1915   PROTEINUR NEGATIVE 03/16/2017 1915   UROBILINOGEN 0.2 03/28/2015 1321   UROBILINOGEN 0.2 04/18/2013 1222   NITRITE NEGATIVE 03/16/2017 1915   LEUKOCYTESUR LARGE (A) 03/16/2017 1915   Sepsis Labs: @LABRCNTIP (procalcitonin:4,lacticidven:4) )No results found for this or any previous visit (from the past 240 hour(s)).   Radiological Exams on Admission: Ct Head Wo Contrast  Result Date: 03/16/2017 CLINICAL DATA:  Altered mental status EXAM: CT HEAD WITHOUT CONTRAST TECHNIQUE: Contiguous axial images were obtained from the base of the skull through the vertex without intravenous contrast. COMPARISON:  03/02/2017, 02/25/2017, 12/03/2016 FINDINGS: Brain: No acute territorial infarction, hemorrhage, or new mass lesion is seen. Stable left occipital craniotomy changes. Small focus of encephalomalacia in the left cerebellum as before. Stable ventricle size.  No midline shift. Vascular: No hyperdense vessels.  No unexpected calcification. Skull: Left occipital and suboccipital postsurgical changes. No acute or suspicious bone lesion Sinuses/Orbits: Mild mucosal thickening in the sphenoid and ethmoid sinuses. No acute orbital abnormality. Other: Mild soft tissue thickening or swelling over the left supraorbital region IMPRESSION: No definite CT evidence for acute intracranial abnormality ; stable postsurgical changes of the posterior fossa and left occipital bone. Electronically Signed   By: Donavan Foil M.D.   On: 03/16/2017 19:09   Dg Chest Port 1 View  Result Date: 03/16/2017 CLINICAL DATA:  Altered mental status. Metastatic left breast cancer. EXAM: PORTABLE CHEST 1 VIEW COMPARISON:  02/25/2017 chest radiograph. FINDINGS: Right subclavian MediPort terminates at the cavoatrial junction. Stable cardiomediastinal silhouette with normal heart size. No  pneumothorax. No pleural effusion. Lungs appear clear, with no acute consolidative airspace disease and no pulmonary edema. Asymmetric haziness overlying the left lower lung is due to overlying left breast prosthesis. IMPRESSION: No active cardiopulmonary disease. Electronically Signed   By: Ilona Sorrel M.D.   On: 03/16/2017 16:31    EKG: Independently reviewed. Sinus rhythm, significant artifact  Assessment/Plan  1. Acute encephalopathy  - Pt presents with several days of confusion and hallucinations  - [name] has had problems with this intermittently since her craniectomy in Sept '17, requiring multiple hospitalizations; the current presentation is very similar to the others - ED workup notable for possible UTI, which in this setting, will be treated empirically with Rocephin pending culture data  - MRI brain was a stable study in earlier this month and CT head appears stable this admission  - B12 and TSH were wnl earlier this year, will send ammonia, RPR, and folate  - Psychiatry has been consulted by ED physician and much appreciated  - Keep safety sitter at bedside   2. Acute cystitis  - UA suggests possible infection and pt is acutely encephalopathic; [name] is not septic  - Urine sent for culture and empiric Rocephin started in ED - Continue empiric abx pending cultures; prior urine cultures with insignificant growth     3. Bipolar disorder  - Pt actively hallucinating on admission and psychiatry consultation is pending  - Resume lithium in am, evening dose held on admission given level at upper limit of normal  - Continue Seroquel, Lamictal, trazodone   4. Breast cancer with brain metastasis  - Pt is status post mastectomy and axillary node dissection, craniectomy, and radiation to brain and breast   - [name] continues to follow with oncology  - Continue Arimidex    DVT prophylaxis: sq Lovenox  Code Status: Full  Family Communication: Discussed with [name] Disposition Plan:  Observe on med-surg Consults called: Psychiatry Admission status: Observation    Vianne Bulls, MD [facility] Pager [phone]  If 7PM-7AM, please contact night-coverage [email or web address] Password [facility]  03/16/2017, 8:44 PM

## 2017-03-16 NOTE — ED Notes (Signed)
Attempting CT scan again.

## 2017-03-16 NOTE — ED Triage Notes (Addendum)
Pt brought to hospital by [facility].  Per roommate pt has been altered for the past week. Pt will have periods of understanding and clear speech then will not know where she is and be very depressed and crying. Pt tearful on arrival

## 2017-03-16 NOTE — ED Provider Notes (Signed)
[facility] DEPT Provider Note   CSN: [id number] Arrival date & time: 03/16/17  1552     History   Chief Complaint Chief Complaint  Patient presents with  . Altered Mental Status    HPI [name] is a 56 y.o. female hx of alcohol abuse, Breast cancer with metastases to the brain but not currently under treatment, opiate dependence, bipolar here presenting with altered mental status. Patient lives with a roommate. She states that she has been confused and forgetful for the last week or so. She states that she has been depressed as well but sometimes gets very disoriented. She was admitted several weeks ago and had extensive workup including a normal MRI brain that showed no recurrent cancer as well as possible UTI. Patient just discharged from the hospital 10 days ago but states that she is very confused. She was seen by psychiatry as well as has been compliant with her psychiatric medicines.    The history is provided by the patient.    Past Medical History:  Diagnosis Date  . Alcohol abuse   . Anemia    during chemo  . Anxiety    At age 52  . Arthritis Dx 2010  . Bipolar disorder ([place])   . Cancer ([place])    breast mets to brain  . Chronic pain   . Complication of anesthesia   . Depression   . Fibromyalgia Dx 2005  . GERD (gastroesophageal reflux disease)   . Headache    hx  migraines  . Opiate dependence ([place])   . PONV (postoperative nausea and vomiting)   . Port-a-cath in place   . PTSD (post-traumatic stress disorder)     Patient Active Problem List   Diagnosis Date Noted  . Acute lower UTI   . Hypokalemia 02/18/2017  . Gait abnormality 01/06/2017  . Bipolar I disorder, most recent episode depressed ([place]) 12/08/2016  . Adjustment disorder with anxiety 12/06/2016  . Delirium due to another medical condition 12/03/2016  . Confusion 12/03/2016  . Diarrhea 12/03/2016  . Metastatic breast cancer ([place])   . Cerumen impaction 11/25/2016  . Otitis media  11/06/2016  . Brain metastasis ([place]) 07/27/2016  . Iron deficiency anemia 06/26/2016  . Bone metastases ([place]) 06/03/2016  . Primary cancer of lower-inner quadrant of left female breast ([place]) 06/01/2016  . Pap smear for cervical cancer screening 03/28/2015  . Current smoker 03/28/2015  . Healthcare maintenance 03/28/2015  . Seasonal allergies 03/28/2015  . Anxiety state 02/28/2015  . Fibromyalgia 02/28/2015  . Family history of diabetes mellitus 02/28/2015  . H/O alcohol abuse     Past Surgical History:  Procedure Laterality Date  . APPLICATION OF CRANIAL NAVIGATION N/A 08/14/2016   Procedure: APPLICATION OF CRANIAL NAVIGATION;  Surgeon: Erline Levine, MD;  Location: [facility];  Service: Neurosurgery;  Laterality: N/A;  . BREAST RECONSTRUCTION Left    with silicone implant  . CRANIOTOMY N/A 08/14/2016   Procedure: CRANIOTOMY TUMOR EXCISION WITH Lucky Rathke;  Surgeon: Erline Levine, MD;  Location: [facility];  Service: Neurosurgery;  Laterality: N/A;  . FIBULA FRACTURE SURGERY Left   . MASTECTOMY Left   . RADIOLOGY WITH ANESTHESIA N/A 07/23/2016   Procedure: MRI OF BRAIN WITH AND WITHOUT;  Surgeon: Medication Radiologist, MD;  Location: [facility];  Service: Radiology;  Laterality: N/A;  . RADIOLOGY WITH ANESTHESIA N/A 09/08/2016   Procedure: MRI OF BRAIN WITH AND WITHOUT CONTRAST;  Surgeon: Medication Radiologist, MD;  Location: [facility];  Service: Radiology;  Laterality:  N/A;  . RADIOLOGY WITH ANESTHESIA N/A 12/10/2016   Procedure: MRI OF BRAIN WITH AND WITHOUT;  Surgeon: Medication Radiologist, MD;  Location: [facility];  Service: Radiology;  Laterality: N/A;  . RADIOLOGY WITH ANESTHESIA N/A 03/02/2017   Procedure: MRI of BRAIN W and W/OUT CONTRAST;  Surgeon: Medication Radiologist, MD;  Location: [facility];  Service: Radiology;  Laterality: N/A;  . right power port placement Right     OB History    No data available       Home Medications    Prior to Admission medications   Medication Sig  Start Date End Date Taking? Authorizing Provider  anastrozole (ARIMIDEX) 1 MG tablet Take 1 tablet (1 mg total) by mouth daily. 08/25/16   Chauncey Cruel, MD  Biotin 1 MG CAPS Take 1 mg by mouth daily.    Historical Provider, MD  cetirizine (ZYRTEC) 10 MG tablet Take 1 tablet (10 mg total) by mouth daily. 07/14/16   Chauncey Cruel, MD  diphenhydrAMINE (BENADRYL) 25 MG tablet Take 25 mg by mouth every 6 (six) hours as needed for allergies.    Historical Provider, MD  gabapentin (NEURONTIN) 300 MG capsule Take 600 mg by mouth 3 (three) times daily.    Historical Provider, MD  ibuprofen (ADVIL,MOTRIN) 200 MG tablet Take 800 mg by mouth every 6 (six) hours as needed for headache, mild pain or moderate pain.     Historical Provider, MD  lamoTRIgine (LAMICTAL) 100 MG tablet Take 1.5 tablets (150 mg total) by mouth daily. 03/03/17   Dron Tanna Furry, MD  lidocaine-prilocaine (EMLA) cream Apply 1 application topically as needed (prior to accessing port).    Historical Provider, MD  lithium carbonate 300 MG capsule Take 300 mg by mouth 2 (two) times daily with a meal.    Historical Provider, MD  magic mouthwash SOLN Take 5 mLs by mouth 4 (four) times daily as needed for mouth pain. 03/05/17   Gardenia Phlegm, NP  pantoprazole (PROTONIX) 40 MG tablet Take 40 mg by mouth daily.    Historical Provider, MD  potassium chloride (MICRO-K) 10 MEQ CR capsule Take 1 capsule (10 mEq total) by mouth daily. 02/18/17   Arnoldo Morale, MD  QUEtiapine (SEROQUEL) 50 MG tablet Take 1 tablet (50 mg total) by mouth 2 (two) times daily. 03/03/17   Dron Tanna Furry, MD  traMADol (ULTRAM) 50 MG tablet Take 1 tablet (50 mg total) by mouth every 12 (twelve) hours as needed. 03/05/17   Gardenia Phlegm, NP  traZODone (DESYREL) 100 MG tablet Take 200 mg by mouth at bedtime.     Historical Provider, MD    Family History Family History  Problem Relation Age of Onset  . Diabetes Mother   . Bipolar disorder Mother     . CAD Father     Social History Social History  Substance Use Topics  . Smoking status: Current Every Day Smoker    Packs/day: 0.25    Types: Cigarettes  . Smokeless tobacco: Never Used     Comment: Pt is on Chantix at present time  . Alcohol use No     Comment: no ETOH since 08/22/12     Allergies   Demerol and Erythromycin   Review of Systems Review of Systems  Neurological: Positive for dizziness and weakness.  Psychiatric/Behavioral: Positive for confusion.  All other systems reviewed and are negative.    Physical Exam Updated Vital Signs BP 104/60   Pulse 85   Temp 97.8  F (36.6 C) (Oral)   Resp 12   SpO2 99%   Physical Exam  Constitutional:  Confused, disoriented   HENT:  Head: Normocephalic.  Mouth/Throat: Oropharynx is clear and moist.  Eyes: EOM are normal. Pupils are equal, round, and reactive to light.  Neck: Normal range of motion. Neck supple.  Cardiovascular: Normal rate, regular rhythm and normal heart sounds.   Pulmonary/Chest: Effort normal and breath sounds normal. No respiratory distress.  Abdominal: Soft. Bowel sounds are normal. She exhibits no distension. There is no tenderness.  Musculoskeletal: Normal range of motion.  Neurological:  Alert, A & O x 2 but waxes and wanes. Nl strength throughout   Skin: Skin is warm.  Psychiatric:  Depressed   Nursing note and vitals reviewed.    ED Treatments / Results  Labs (all labs ordered are listed, but only abnormal results are displayed) Labs Reviewed  CBC WITH DIFFERENTIAL/PLATELET - Abnormal; Notable for the following:       Result Value   RBC 3.75 (*)    Hemoglobin 11.5 (*)    All other components within normal limits  COMPREHENSIVE METABOLIC PANEL - Abnormal; Notable for the following:    BUN 5 (*)    Total Protein 5.5 (*)    ALT 11 (*)    Alkaline Phosphatase 34 (*)    All other components within normal limits  URINALYSIS, ROUTINE W REFLEX MICROSCOPIC - Abnormal; Notable for  the following:    Hgb urine dipstick SMALL (*)    Ketones, ur 20 (*)    Leukocytes, UA LARGE (*)    Squamous Epithelial / LPF 0-5 (*)    All other components within normal limits  ACETAMINOPHEN LEVEL - Abnormal; Notable for the following:    Acetaminophen (Tylenol), Serum <10 (*)    All other components within normal limits  ETHANOL  SALICYLATE LEVEL  LITHIUM LEVEL  RAPID URINE DRUG SCREEN, HOSP PERFORMED  I-STAT TROPOININ, ED    EKG  EKG Interpretation  Date/Time:  Tuesday March 16 2017 16:05:07 EDT Ventricular Rate:  86 PR Interval:    QRS Duration: 89 QT Interval:  398 QTC Calculation: 479 R Axis:   72 Text Interpretation:  Sinus rhythm Probable left atrial enlargement Abnormal T, consider ischemia, lateral leads No significant change since last tracing Confirmed by Melenda Bielak  MD, Jesseca Marsch ([id number]) on 03/16/2017 4:26:05 PM       Radiology Ct Head Wo Contrast  Result Date: 03/16/2017 CLINICAL DATA:  Altered mental status EXAM: CT HEAD WITHOUT CONTRAST TECHNIQUE: Contiguous axial images were obtained from the base of the skull through the vertex without intravenous contrast. COMPARISON:  03/02/2017, 02/25/2017, 12/03/2016 FINDINGS: Brain: No acute territorial infarction, hemorrhage, or new mass lesion is seen. Stable left occipital craniotomy changes. Small focus of encephalomalacia in the left cerebellum as before. Stable ventricle size.  No midline shift. Vascular: No hyperdense vessels.  No unexpected calcification. Skull: Left occipital and suboccipital postsurgical changes. No acute or suspicious bone lesion Sinuses/Orbits: Mild mucosal thickening in the sphenoid and ethmoid sinuses. No acute orbital abnormality. Other: Mild soft tissue thickening or swelling over the left supraorbital region IMPRESSION: No definite CT evidence for acute intracranial abnormality ; stable postsurgical changes of the posterior fossa and left occipital bone. Electronically Signed   By: Donavan Foil M.D.    On: 03/16/2017 19:09   Dg Chest Port 1 View  Result Date: 03/16/2017 CLINICAL DATA:  Altered mental status. Metastatic left breast cancer. EXAM: PORTABLE CHEST 1 VIEW COMPARISON:  02/25/2017 chest radiograph. FINDINGS: Right subclavian MediPort terminates at the cavoatrial junction. Stable cardiomediastinal silhouette with normal heart size. No pneumothorax. No pleural effusion. Lungs appear clear, with no acute consolidative airspace disease and no pulmonary edema. Asymmetric haziness overlying the left lower lung is due to overlying left breast prosthesis. IMPRESSION: No active cardiopulmonary disease. Electronically Signed   By: Ilona Sorrel M.D.   On: 03/16/2017 16:31    Procedures Procedures (including critical care time)  Medications Ordered in ED Medications  cefTRIAXone (ROCEPHIN) 1 g in dextrose 5 % 50 mL IVPB (not administered)  sodium chloride 0.9 % bolus 1,000 mL (0 mLs Intravenous Stopped 03/16/17 1824)  LORazepam (ATIVAN) injection 1 mg (1 mg Intravenous Given 03/16/17 1706)     Initial Impression / Assessment and Plan / ED Course  I have reviewed the triage vital signs and the nursing notes.  Pertinent labs & imaging results that were available during my care of the patient were reviewed by me and considered in my medical decision making (see chart for details).    [name] is a 56 y.o. female here with confusion. This is a recurrent problem and was thought to be from psych vs UTI recently. Will repeat labs, CT head, CXR, UA.   8:06 PM Labs unremarkable. Lithium level therapeutic. UA + large leuk and too many to count WBC. Patient has waxing and waning mental status. I consulted psych but will give rocephin for UTI and admit since she may have a medical cause of her confusion.    Final Clinical Impressions(s) / ED Diagnoses   Final diagnoses:  None    New Prescriptions New Prescriptions   No medications on file     Drenda Freeze, MD 03/16/17 2019

## 2017-03-16 NOTE — ED Notes (Signed)
Pt. Seems to be having hallucinations. Looks at chair and says "I better not mess with him." with no one in the room besides me (allye, nt) pt a few mins later says "im going to let him go out of the door first" as we were on our way to the bathroom (again with just [place] two in the room),. Pt slurring sentences and not able to form a real sentence.

## 2017-03-17 DIAGNOSIS — T43595A Adverse effect of other antipsychotics and neuroleptics, initial encounter: Secondary | ICD-10-CM | POA: Diagnosis present

## 2017-03-17 DIAGNOSIS — F419 Anxiety disorder, unspecified: Secondary | ICD-10-CM | POA: Diagnosis not present

## 2017-03-17 DIAGNOSIS — F319 Bipolar disorder, unspecified: Secondary | ICD-10-CM | POA: Diagnosis present

## 2017-03-17 DIAGNOSIS — Z833 Family history of diabetes mellitus: Secondary | ICD-10-CM | POA: Diagnosis not present

## 2017-03-17 DIAGNOSIS — Y838 Other surgical procedures as the cause of abnormal reaction of the patient, or of later complication, without mention of misadventure at the time of the procedure: Secondary | ICD-10-CM | POA: Diagnosis present

## 2017-03-17 DIAGNOSIS — C50919 Malignant neoplasm of unspecified site of unspecified female breast: Secondary | ICD-10-CM | POA: Diagnosis not present

## 2017-03-17 DIAGNOSIS — Z9012 Acquired absence of left breast and nipple: Secondary | ICD-10-CM | POA: Diagnosis not present

## 2017-03-17 DIAGNOSIS — Z79811 Long term (current) use of aromatase inhibitors: Secondary | ICD-10-CM | POA: Diagnosis not present

## 2017-03-17 DIAGNOSIS — R42 Dizziness and giddiness: Secondary | ICD-10-CM | POA: Diagnosis not present

## 2017-03-17 DIAGNOSIS — C50312 Malignant neoplasm of lower-inner quadrant of left female breast: Secondary | ICD-10-CM | POA: Diagnosis not present

## 2017-03-17 DIAGNOSIS — J029 Acute pharyngitis, unspecified: Secondary | ICD-10-CM | POA: Diagnosis not present

## 2017-03-17 DIAGNOSIS — Z818 Family history of other mental and behavioral disorders: Secondary | ICD-10-CM | POA: Diagnosis not present

## 2017-03-17 DIAGNOSIS — G9349 Other encephalopathy: Secondary | ICD-10-CM | POA: Diagnosis present

## 2017-03-17 DIAGNOSIS — R410 Disorientation, unspecified: Secondary | ICD-10-CM | POA: Diagnosis not present

## 2017-03-17 DIAGNOSIS — C7951 Secondary malignant neoplasm of bone: Secondary | ICD-10-CM | POA: Diagnosis not present

## 2017-03-17 DIAGNOSIS — G251 Drug-induced tremor: Secondary | ICD-10-CM | POA: Diagnosis present

## 2017-03-17 DIAGNOSIS — N3000 Acute cystitis without hematuria: Secondary | ICD-10-CM | POA: Diagnosis present

## 2017-03-17 DIAGNOSIS — Z923 Personal history of irradiation: Secondary | ICD-10-CM | POA: Diagnosis not present

## 2017-03-17 DIAGNOSIS — Z171 Estrogen receptor negative status [ER-]: Secondary | ICD-10-CM | POA: Diagnosis not present

## 2017-03-17 DIAGNOSIS — F3130 Bipolar disorder, current episode depressed, mild or moderate severity, unspecified: Secondary | ICD-10-CM | POA: Diagnosis not present

## 2017-03-17 DIAGNOSIS — D72819 Decreased white blood cell count, unspecified: Secondary | ICD-10-CM | POA: Diagnosis present

## 2017-03-17 DIAGNOSIS — C7931 Secondary malignant neoplasm of brain: Secondary | ICD-10-CM | POA: Diagnosis not present

## 2017-03-17 DIAGNOSIS — Z8249 Family history of ischemic heart disease and other diseases of the circulatory system: Secondary | ICD-10-CM | POA: Diagnosis not present

## 2017-03-17 DIAGNOSIS — N390 Urinary tract infection, site not specified: Secondary | ICD-10-CM | POA: Diagnosis not present

## 2017-03-17 DIAGNOSIS — Z853 Personal history of malignant neoplasm of breast: Secondary | ICD-10-CM | POA: Diagnosis not present

## 2017-03-17 DIAGNOSIS — F17210 Nicotine dependence, cigarettes, uncomplicated: Secondary | ICD-10-CM | POA: Diagnosis present

## 2017-03-17 DIAGNOSIS — M797 Fibromyalgia: Secondary | ICD-10-CM | POA: Diagnosis present

## 2017-03-17 DIAGNOSIS — Z79899 Other long term (current) drug therapy: Secondary | ICD-10-CM | POA: Diagnosis not present

## 2017-03-17 DIAGNOSIS — R400 Somnolence: Secondary | ICD-10-CM | POA: Diagnosis not present

## 2017-03-17 DIAGNOSIS — D630 Anemia in neoplastic disease: Secondary | ICD-10-CM | POA: Diagnosis present

## 2017-03-17 DIAGNOSIS — G9340 Encephalopathy, unspecified: Secondary | ICD-10-CM | POA: Diagnosis not present

## 2017-03-17 DIAGNOSIS — G9782 Other postprocedural complications and disorders of nervous system: Secondary | ICD-10-CM | POA: Diagnosis present

## 2017-03-17 DIAGNOSIS — K219 Gastro-esophageal reflux disease without esophagitis: Secondary | ICD-10-CM | POA: Diagnosis present

## 2017-03-17 DIAGNOSIS — Z85841 Personal history of malignant neoplasm of brain: Secondary | ICD-10-CM | POA: Diagnosis not present

## 2017-03-17 LAB — BASIC METABOLIC PANEL
Anion gap: 8 (ref 5–15)
BUN: 6 mg/dL (ref 6–20)
CO2: 26 mmol/L (ref 22–32)
Calcium: 9.4 mg/dL (ref 8.9–10.3)
Chloride: 107 mmol/L (ref 101–111)
Creatinine, Ser: 0.71 mg/dL (ref 0.44–1.00)
GFR calc Af Amer: >60 mL/min (ref >60)
GFR calc non Af Amer: >60 mL/min (ref >60)
Glucose, Bld: 94 mg/dL (ref 65–99)
Potassium: 3.8 mmol/L (ref 3.5–5.1)
Sodium: 141 mmol/L (ref 135–145)

## 2017-03-17 LAB — RPR: RPR Ser Ql: NONREACTIVE

## 2017-03-17 LAB — GLUCOSE, CAPILLARY: Glucose-Capillary: 97 mg/dL (ref 65–99)

## 2017-03-17 MED ORDER — IOPAMIDOL (ISOVUE-300) INJECTION 61%
INTRAVENOUS | Status: AC
  Filled 2017-03-17: qty 75

## 2017-03-17 MED ORDER — LORAZEPAM 1 MG PO TABS
1.0000 mg | ORAL_TABLET | ORAL | Status: DC | PRN
  Administered 2017-03-17 – 2017-03-22 (×13): 1 mg via ORAL
  Filled 2017-03-17 (×14): qty 1

## 2017-03-17 NOTE — Progress Notes (Signed)
Paged Dr. Darrick Meigs, spoke with him on floor.  Advised pt is impulsive, tearful, requesting ativan. Pt needs safety sitter in room.  Dr. Darrick Meigs stated OK for safety sitter in room.

## 2017-03-17 NOTE — Progress Notes (Signed)
Pt also refused CT scan also.

## 2017-03-17 NOTE — Telephone Encounter (Signed)
[place] representative, Dysheka aware of granted verbal order.

## 2017-03-17 NOTE — Progress Notes (Signed)
Pt refused bone scan, stated she has one scheduled next week.  Spoke with Dr. Darrick Meigs on floor, advised pt had refused bone scan and is shaky, agitated, impulsive.  Pt still requesting ativan.  Dr. Darrick Meigs V/O ativan 1 mg po Q 4hr PRN.  Nurse Gabriel Carina in room with patient until [employer] cabin crew arrives.

## 2017-03-17 NOTE — Progress Notes (Signed)
[facility]  Patient Status: Active (receiving services up to time of hospitalization)  [facility] is providing the following services: RN, MSW and HHA  If patient discharges after hours, please call [phone].   [name] 03/17/2017, 4:52 PM

## 2017-03-17 NOTE — Progress Notes (Deleted)
Dr. Grandville Silos returned page OK to remove IJ.

## 2017-03-17 NOTE — Progress Notes (Signed)
Called Dr. Griffith Citron, advised pt refused CT of chest and Bone Scan.  Dr. Griffith Citron stated ok just document.

## 2017-03-17 NOTE — Progress Notes (Signed)
[facility]  PROGRESS NOTE  [name] KVQ:[id number] DOB: [birth year] DOA: 03/16/2017 PCP: Arnoldo Morale, MD   Brief HPI:    56 year old female with history of alcohol abuse, bipolar disorder, left breast cancer with brain metastasis status post mastectomy and craniectomy with radiation to brain and breast came to ED with confusion and hallucinations. Patient started on IV Rocephin for acute cystitis   Subjective   This morning patient denies any complaints, she knows she is in the hospital, unable to tell me month.   Assessment/Plan:     1. Acute encephalopathy- improving, patient has no intermittent episodes of confusion and hallucinations this has been going on since patient had suboccipital craniectomy for resection of posterior fossa metastasis September 2017 requiring multiple hospitalizations. Patient started on Rocephin for possible UTI. MRI brain earlier this month was normal CT head was negative during this admission. 2. Acute cystitis- patient had abnormal UA, started on IV Rocephin. Continue IV antibiotics we'll follow urine culture results. 3. Bipolar disorder- patient has been having hallucinations, will consult psych for further recommendations. We'll continue lithium, Seroquel, Lamictal, trazodone 4. History of breast cancer with brain metastasis- patient is status post mastectomy and axillary node dissection, suboccipital craniectomy for resection of posterior fossa metastasis, radiation to brain and breast. Continue Arimidex.    DVT prophylaxis: Lovenox  Code Status: Full code  Family Communication: *No family present at bedside  Disposition Plan: Pending improvement in mental status, will also need psychiatry and social work involved for safe discharge   Consultants:  None  Procedures:  None  Continuous infusions . sodium chloride    . cefTRIAXone (ROCEPHIN)  IV        Antibiotics:   Anti-infectives    Start     Dose/Rate Route Frequency  Ordered Stop   03/17/17 2000  cefTRIAXone (ROCEPHIN) 1 g in dextrose 5 % 50 mL IVPB     1 g 100 mL/hr over 30 Minutes Intravenous Every 24 hours 03/16/17 2043     03/16/17 2015  cefTRIAXone (ROCEPHIN) 1 g in dextrose 5 % 50 mL IVPB     1 g 100 mL/hr over 30 Minutes Intravenous  Once 03/16/17 2000 03/16/17 2041       Objective   Vitals:   03/16/17 2049 03/16/17 2121 03/17/17 0914 03/17/17 1651  BP:  110/64 (!) 105/49 105/76  Pulse:  63 84 (!) 118  Resp:  20 18 16   Temp: 97.8 F (36.6 C) 98.1 F (36.7 C) 98.2 F (36.8 C) 97.8 F (36.6 C)  TempSrc: Oral Oral Oral Oral  SpO2:  100% 98% 94%    Intake/Output Summary (Last 24 hours) at 03/17/17 1813 Last data filed at 03/17/17 1500  Gross per 24 hour  Intake             1480 ml  Output             1300 ml  Net              180 ml   There were no vitals filed for this visit.   Physical Examination:  Physical Exam: Eyes: No icterus, extraocular muscles intact  Mouth: Oral mucosa is moist, no lesions on palate,  Neck: Supple, no deformities, masses, or tenderness Lungs: Normal respiratory effort, bilateral clear to auscultation, no crackles or wheezes.  Heart: Regular rate and rhythm, S1 and S2 normal, no murmurs, rubs auscultated Abdomen: BS normoactive,soft,nondistended,non-tender to palpation,no organomegaly Extremities: No pretibial edema, no erythema, no cyanosis, no clubbing  Neuro : Alert and oriented to time, place and person, No focal deficits Skin: No rashes seen on exam    Data Reviewed: I have personally reviewed following labs and imaging studies  CBG:  Recent Labs Lab 03/17/17 0759  GLUCAP 97    CBC:  Recent Labs Lab 03/16/17 1630  WBC 4.5  NEUTROABS 3.1  HGB 11.5*  HCT 36.3  MCV 96.8  PLT 094    Basic Metabolic Panel:  Recent Labs Lab 03/16/17 1630 03/17/17 0501  NA 139 141  K 3.9 3.8  CL 105 107  CO2 24 26  GLUCOSE 96 94  BUN 5* 6  CREATININE 0.79 0.71  CALCIUM 9.8 9.4     No results found for this or any previous visit (from the past 240 hour(s)).   Liver Function Tests:  Recent Labs Lab 03/16/17 1630  AST 15  ALT 11*  ALKPHOS 34*  BILITOT 0.5  PROT 5.5*  ALBUMIN 3.8   No results for input(s): LIPASE, AMYLASE in the last 168 hours.  Recent Labs Lab 03/16/17 2100  AMMONIA 16       Studies: Ct Head Wo Contrast  Result Date: 03/16/2017 CLINICAL DATA:  Altered mental status EXAM: CT HEAD WITHOUT CONTRAST TECHNIQUE: Contiguous axial images were obtained from the base of the skull through the vertex without intravenous contrast. COMPARISON:  03/02/2017, 02/25/2017, 12/03/2016 FINDINGS: Brain: No acute territorial infarction, hemorrhage, or new mass lesion is seen. Stable left occipital craniotomy changes. Small focus of encephalomalacia in the left cerebellum as before. Stable ventricle size.  No midline shift. Vascular: No hyperdense vessels.  No unexpected calcification. Skull: Left occipital and suboccipital postsurgical changes. No acute or suspicious bone lesion Sinuses/Orbits: Mild mucosal thickening in the sphenoid and ethmoid sinuses. No acute orbital abnormality. Other: Mild soft tissue thickening or swelling over the left supraorbital region IMPRESSION: No definite CT evidence for acute intracranial abnormality ; stable postsurgical changes of the posterior fossa and left occipital bone. Electronically Signed   By: Donavan Foil M.D.   On: 03/16/2017 19:09   Dg Chest Port 1 View  Result Date: 03/16/2017 CLINICAL DATA:  Altered mental status. Metastatic left breast cancer. EXAM: PORTABLE CHEST 1 VIEW COMPARISON:  02/25/2017 chest radiograph. FINDINGS: Right subclavian MediPort terminates at the cavoatrial junction. Stable cardiomediastinal silhouette with normal heart size. No pneumothorax. No pleural effusion. Lungs appear clear, with no acute consolidative airspace disease and no pulmonary edema. Asymmetric haziness overlying the left lower  lung is due to overlying left breast prosthesis. IMPRESSION: No active cardiopulmonary disease. Electronically Signed   By: Ilona Sorrel M.D.   On: 03/16/2017 16:31    Scheduled Meds: . anastrozole  1 mg Oral Daily  . enoxaparin (LOVENOX) injection  40 mg Subcutaneous Q24H  . gabapentin  600 mg Oral TID  . iopamidol      . lamoTRIgine  150 mg Oral Daily  . lithium carbonate  300 mg Oral BID WC  . loratadine  10 mg Oral Daily  . pantoprazole  40 mg Oral Daily  . potassium chloride  10 mEq Oral Daily  . QUEtiapine  50 mg Oral BID  . sodium chloride flush  3 mL Intravenous Q12H  . traZODone  200 mg Oral QHS      Time spent: 25 min  [facility] Pager [phone]. If 7PM-7AM, please contact night-coverage at [email or web address], Office  [phone]  password [email or web address] 03/17/2017, 6:13 PM  LOS: 0 days

## 2017-03-17 NOTE — Progress Notes (Signed)
Appreciate learning of [name]'s admission. I am copying the summary of [name] breast cancer history below, but in brief: [name] stage IV disease is optimally controlled at this point; chest CT and bone scan December 2017 showed no active disease and recent repeat brain MRI 03/02/2017 was negative.   She was scheduled for repeat chest CT and bone scan as outpatient but did not show. We will go ahead and obtain those studies this admission to update [name] restaging.  She continues on anastrozole daily and herceptin every 21 days, next herceptin dose due 03/26/2017. She will receive denosumab/Xgeva at the same time. She generally tolerates this well.  Stefan has had multiple ED visits/admissions indicating that [name] bipolar disorder is not controlled on [name] current medications and that she is not able to care for herself in [name] current situation. I would urge psychiatry and Social Work to help [place] come up with a plan that will allow [name] to be safe and functional as outpatient. Otherwise we are just going to be readmitting [name] a few days after [name] discharge.  I greatly appreciate your help to this [name]!    BREAST CANCER SUMMARY:  56 y.o.  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  (2) status post left mastectomy and axillary lymph node dissection with immediate expander placement 07/18/2015 for an mpT4 pN2,stage IIIB invasive ductal carcinoma, grade 3, with negative margins. (a) definitive implant exchange to be scheduled in December  METASTATIC DISEASE: October 2016  (3) CT scan of the chest abdomen and pelvis 09/16/2015 shows metastatic lesions in the right scapula, left iliac crest, L4, and T spine.  There were questionable liver cysts, with repeat CT scan 03/02/2016 showing possible right upper lobe lung lesions and possibly increased liver lesions (a) CT scan of the chest 06/17/2016 shows no active disease in the lungs or liver (b) Bone scan July 2017 showed no evidence of bony metastatic disease  (c) head CT 07/08/2016 showed a cerebellar lesion, confirmed by MRI 07/23/2016, status post craniotomy 08/14/2016, confirming a metastatic deposit which was estrogen and progesterone receptor negative, [name]-2 amplified with a signals ratio of 7.16, number per cell 13.25 (d) CA 27-29 not informative  (4) received docetaxel every 3 weeks 6 together with trastuzumab and pertuzumab, last docetaxel dose 02/11/2016  (5) adjuvant radiation7/03/2016 to 06/26/2016 at [place]: 1. The Left chest wall was treated to 23.4 Gy in 13 fractions at 1.8 Gy per fraction. 2. The Left chest wall was boosted to 10 Gy in 5 fractions at 2 Gy per fraction. 3. The Left Sclav/PAB was treated to 23.4 Gy in 13 fractions at 1.8 Gy per fraction.  Including [name]'s treatment in [place] (received 15 fractions per Dr. Maryan Rued near [place], [place]), [name] received 50.4 Gy to the left chest wall and supraclavicular region.  (6) started trastuzumab and pertuzumabOctober 2016, continuing every 3 weeks, (a) echocardiogram 02/26/2016 showed a well preserved ejection fraction (b) echocardiogram 07/01/2016 shows an ejection fraction in the 60-65%  (c) pertuzumab discontinued 08/25/2016 with uncontrolled diarrhea (d) echocardiogram 11/11/2016 showed an ejection fraction in the 60-65%  (e) echocardiogram 03/03/2017 shows and EF of 60-65%  (7) started denosumab/XgevaOctober 2017, repeated every 6 weeks  (8) started anastrozole October 2017  (a) bone scan 11/10/2016 shows no  active disease (b)  chest CT scan 11/10/2016 stable, with no evidence of active disease  (9) history of bipolar disorder (a) lithium level checked every 6 weeks  (10) mild anemia with a significant drop in the MCV, ferritin 10 06/03/2016,  (a) Feraheme given 06/12/2016 and 06/18/2016  (11) tobacco abuse: Chantix started 06/18/2016  (12) 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 (a) brain irradiation10/19/17-11/08/17:Whole brain/ 35 Gy in 14 fractions (b) repeat brain MRI obtained 12/10/2016 shows no active disease in the brain             (c) repeat brain MRI under sedation 03/02/2017 shows NAD  Plan:

## 2017-03-18 ENCOUNTER — Inpatient Hospital Stay (HOSPITAL_COMMUNITY): Payer: Medicaid Other

## 2017-03-18 DIAGNOSIS — C7931 Secondary malignant neoplasm of brain: Secondary | ICD-10-CM

## 2017-03-18 DIAGNOSIS — F319 Bipolar disorder, unspecified: Secondary | ICD-10-CM

## 2017-03-18 LAB — URINE CULTURE

## 2017-03-18 LAB — FOLATE RBC
Folate, Hemolysate: 469.9 ng/mL
Folate, RBC: 1370 ng/mL (ref >498)
Hematocrit: 34.3 % (ref 34.0–46.6)

## 2017-03-18 LAB — GLUCOSE, CAPILLARY: Glucose-Capillary: 128 mg/dL — ABNORMAL HIGH (ref 65–99)

## 2017-03-18 MED ORDER — LITHIUM CARBONATE ER 450 MG PO TBCR
450.0000 mg | EXTENDED_RELEASE_TABLET | Freq: Every day | ORAL | Status: DC
  Administered 2017-03-18 – 2017-03-22 (×4): 450 mg via ORAL
  Filled 2017-03-18 (×7): qty 1

## 2017-03-18 MED ORDER — QUETIAPINE FUMARATE 50 MG PO TABS
50.0000 mg | ORAL_TABLET | Freq: Every day | ORAL | Status: DC
  Administered 2017-03-19 – 2017-03-30 (×12): 50 mg via ORAL
  Filled 2017-03-18 (×12): qty 1

## 2017-03-18 MED ORDER — IOPAMIDOL (ISOVUE-300) INJECTION 61%
75.0000 mL | Freq: Once | INTRAVENOUS | Status: AC | PRN
  Administered 2017-03-18: 75 mL via INTRAVENOUS

## 2017-03-18 MED ORDER — SODIUM CHLORIDE 0.9% FLUSH
10.0000 mL | Freq: Two times a day (BID) | INTRAVENOUS | Status: DC
  Administered 2017-03-18 – 2017-04-06 (×11): 10 mL

## 2017-03-18 MED ORDER — IOPAMIDOL (ISOVUE-300) INJECTION 61%
INTRAVENOUS | Status: AC
  Filled 2017-03-18: qty 75

## 2017-03-18 NOTE — Progress Notes (Signed)
[facility]  PROGRESS NOTE  Mandeep Ferch JXB:[id number] DOB: 1961-07-27 DOA: 03/16/2017 PCP: Arnoldo Morale, MD   Brief HPI:    56 year old female with history of alcohol abuse, bipolar disorder, left breast cancer with brain metastasis status post mastectomy and craniectomy with radiation to brain and breast came to ED with confusion and hallucinations. Patient started on IV Rocephin for acute cystitis.   Subjective   Patient seen and examined, complains of tremors for past 2 weeks. Patient is currently on Lithium and Lamictal for bipolar disorder.   Assessment/Plan:     1. Acute encephalopathy- improving, patient has no intermittent episodes of confusion and hallucinations this has been going on since patient had suboccipital craniectomy for resection of posterior fossa metastasis September 2017 requiring multiple hospitalizations. Patient started on Rocephin for possible UTI, urine culture growing multiple species . MRI brain earlier this month was normal CT head was negative during this admission. Will discontinue Rocephin at this time 2. Acute cystitis- patient had abnormal UA, started on IV Rocephin. Urine culture growing multiple species. We'll discontinue Rocephin. 3. Bipolar disorder- patient has been having hallucinations, will consult psych for further recommendations. We'll continue lithium, Seroquel, Lamictal, trazodone. 4. Tremors- likely from lithium, will await psychiatric recommendations. 5. History of breast cancer with brain metastasis- patient is status post mastectomy and axillary node dissection, suboccipital craniectomy for resection of posterior fossa metastasis, radiation to brain and breast. Continue Arimidex.    DVT prophylaxis: Lovenox  Code Status: Full code  Family Communication: No family present at bedside  Disposition Plan: Pending improvement in mental status, will also need psychiatry and social work involved for safe  discharge   Consultants:  None  Procedures:  None  Continuous infusions . sodium chloride    . cefTRIAXone (ROCEPHIN)  IV Stopped (03/18/17 0705)    Antibiotics:   Anti-infectives    Start     Dose/Rate Route Frequency Ordered Stop   03/17/17 2000  cefTRIAXone (ROCEPHIN) 1 g in dextrose 5 % 50 mL IVPB     1 g 100 mL/hr over 30 Minutes Intravenous Every 24 hours 03/16/17 2043     03/16/17 2015  cefTRIAXone (ROCEPHIN) 1 g in dextrose 5 % 50 mL IVPB     1 g 100 mL/hr over 30 Minutes Intravenous  Once 03/16/17 2000 03/16/17 2041       Objective   Vitals:   03/17/17 1651 03/17/17 2218 03/18/17 0636 03/18/17 1031  BP: 105/76 99/60 100/65 (!) 106/41  Pulse: (!) 118 78 77 96  Resp: 16 18 19 18   Temp: 97.8 F (36.6 C) 98.2 F (36.8 C) 98.3 F (36.8 C)   TempSrc: Oral Oral Oral   SpO2: 94% 100% 100% 100%    Intake/Output Summary (Last 24 hours) at 03/18/17 1552 Last data filed at 03/18/17 1510  Gross per 24 hour  Intake              483 ml  Output             1000 ml  Net             -517 ml   There were no vitals filed for this visit.   Physical Examination:  Physical Exam: Eyes: No icterus, extraocular muscles intact  Mouth: Oral mucosa is moist, no lesions on palate,  Neck: Supple, no deformities, masses, or tenderness Lungs: Normal respiratory effort, bilateral clear to auscultation, no crackles or wheezes.  Heart: Regular rate and rhythm, S1 and S2 normal,  no murmurs, rubs auscultated Abdomen: BS normoactive,soft,nondistended,non-tender to palpation,no organomegaly Extremities: No pretibial edema, no erythema, no cyanosis, no clubbing Neuro : Alert and oriented to time, place and person, No focal deficits     Data Reviewed: I have personally reviewed following labs and imaging studies  CBG:  Recent Labs Lab 03/17/17 0759 03/18/17 1027  GLUCAP 97 128*    CBC:  Recent Labs Lab 03/16/17 1630 03/16/17 2134  WBC 4.5  --   NEUTROABS 3.1  --    HGB 11.5*  --   HCT 36.3 34.3  MCV 96.8  --   PLT 180  --     Basic Metabolic Panel:  Recent Labs Lab 03/16/17 1630 03/17/17 0501  NA 139 141  K 3.9 3.8  CL 105 107  CO2 24 26  GLUCOSE 96 94  BUN 5* 6  CREATININE 0.79 0.71  CALCIUM 9.8 9.4    Recent Results (from the past 240 hour(s))  Culture, Urine     Status: Abnormal   Collection Time: 03/16/17  7:15 PM  Result Value Ref Range Status   Specimen Description URINE, RANDOM  Final   Special Requests NONE  Final   Culture MULTIPLE SPECIES PRESENT, SUGGEST RECOLLECTION (A)  Final   Report Status 03/18/2017 FINAL  Final     Liver Function Tests:  Recent Labs Lab 03/16/17 1630  AST 15  ALT 11*  ALKPHOS 34*  BILITOT 0.5  PROT 5.5*  ALBUMIN 3.8   No results for input(s): LIPASE, AMYLASE in the last 168 hours.  Recent Labs Lab 03/16/17 2100  AMMONIA 16    Studies: Ct Head Wo Contrast  Result Date: 03/16/2017 CLINICAL DATA:  Altered mental status EXAM: CT HEAD WITHOUT CONTRAST TECHNIQUE: Contiguous axial images were obtained from the base of the skull through the vertex without intravenous contrast. COMPARISON:  03/02/2017, 02/25/2017, 12/03/2016 FINDINGS: Brain: No acute territorial infarction, hemorrhage, or new mass lesion is seen. Stable left occipital craniotomy changes. Small focus of encephalomalacia in the left cerebellum as before. Stable ventricle size.  No midline shift. Vascular: No hyperdense vessels.  No unexpected calcification. Skull: Left occipital and suboccipital postsurgical changes. No acute or suspicious bone lesion Sinuses/Orbits: Mild mucosal thickening in the sphenoid and ethmoid sinuses. No acute orbital abnormality. Other: Mild soft tissue thickening or swelling over the left supraorbital region IMPRESSION: No definite CT evidence for acute intracranial abnormality ; stable postsurgical changes of the posterior fossa and left occipital bone. Electronically Signed   By: Donavan Foil M.D.    On: 03/16/2017 19:09   Ct Chest W Contrast  Result Date: 03/18/2017 CLINICAL DATA:  Breast cancer.  Short breath.  Brain metastasis. EXAM: CT CHEST WITH CONTRAST TECHNIQUE: Multidetector CT imaging of the chest was performed during intravenous contrast administration. CONTRAST:  3mL ISOVUE-300 IOPAMIDOL (ISOVUE-300) INJECTION 61% COMPARISON:  CT 11/10/2016 FINDINGS: Cardiovascular: Port in RIGHT chest wall with tip the distal SVC. No significant vascular findings. Normal heart size. No pericardial effusion. Mediastinum/Nodes: No axillary supraclavicular adenopathy. No mediastinal hilar adenopathy Lungs/Pleura: Mild atelectasis lung bases. Mild linear scarring LEFT lung apex. No suspicious nodularity. Pulmonary edema or pulmonary infection. Upper Abdomen: Limited view of the liver, kidneys, pancreas are unremarkable. Normal adrenal glands. Multiple small hepatic hypodensities likely represent benign cysts for not comparison Musculoskeletal: Scatter sclerotic lesions in the spine for stable. Post LEFT mastectomy with breast prosthetic. IMPRESSION: 1. No evidence of breast cancer progression. 2. No acute pulmonary parenchymal findings. 3. Stable sclerotic lesions in the  thoracic spine. Electronically Signed   By: Suzy Bouchard M.D.   On: 03/18/2017 12:14   Dg Chest Port 1 View  Result Date: 03/16/2017 CLINICAL DATA:  Altered mental status. Metastatic left breast cancer. EXAM: PORTABLE CHEST 1 VIEW COMPARISON:  02/25/2017 chest radiograph. FINDINGS: Right subclavian MediPort terminates at the cavoatrial junction. Stable cardiomediastinal silhouette with normal heart size. No pneumothorax. No pleural effusion. Lungs appear clear, with no acute consolidative airspace disease and no pulmonary edema. Asymmetric haziness overlying the left lower lung is due to overlying left breast prosthesis. IMPRESSION: No active cardiopulmonary disease. Electronically Signed   By: Ilona Sorrel M.D.   On: 03/16/2017 16:31     Scheduled Meds: . anastrozole  1 mg Oral Daily  . enoxaparin (LOVENOX) injection  40 mg Subcutaneous Q24H  . gabapentin  600 mg Oral TID  . iopamidol      . lamoTRIgine  150 mg Oral Daily  . lithium carbonate  300 mg Oral BID WC  . loratadine  10 mg Oral Daily  . pantoprazole  40 mg Oral Daily  . potassium chloride  10 mEq Oral Daily  . QUEtiapine  50 mg Oral BID  . sodium chloride flush  10-40 mL Intracatheter Q12H  . sodium chloride flush  3 mL Intravenous Q12H  . traZODone  200 mg Oral QHS      Time spent: 25 min  [facility] Pager [phone]. If 7PM-7AM, please contact night-coverage at [email or web address], Office  [phone]  password [email or web address] 03/18/2017, 3:52 PM  LOS: 1 day

## 2017-03-18 NOTE — Consult Note (Signed)
[facility] Consult   Reason for Consult:  Bipolar disorder and  Referring Physician:  Dr. Darrick Meigs Patient Identification: [name] MRN:  [id number] Principal Diagnosis: Acute encephalopathy Diagnosis:   Patient Active Problem List   Diagnosis Date Noted  . Acute encephalopathy [G93.40] 03/16/2017  . Acute lower UTI [N39.0]   . Hypokalemia [E87.6] 02/18/2017  . Gait abnormality [R26.9] 01/06/2017  . Bipolar I disorder, most recent episode depressed ([place]) [F31.30] 12/08/2016  . Adjustment disorder with anxiety [F43.22] 12/06/2016  . Delirium due to another medical condition [F05] 12/03/2016  . Confusion [R41.0] 12/03/2016  . Diarrhea [R19.7] 12/03/2016  . Metastatic breast cancer ([place]) [C50.919]   . Cerumen impaction [H61.20] 11/25/2016  . Otitis media [H66.90] 11/06/2016  . Brain metastasis ([place]) [C79.31] 07/27/2016  . Iron deficiency anemia [D50.9] 06/26/2016  . Bone metastases ([place]) [C79.51] 06/03/2016  . Primary cancer of lower-inner quadrant of left female breast ([place]) [C50.312] 06/01/2016  . Pap smear for cervical cancer screening [Z12.4] 03/28/2015  . Current smoker [F17.200] 03/28/2015  . Healthcare maintenance [Z00.00] 03/28/2015  . Seasonal allergies [J30.2] 03/28/2015  . Anxiety state [F41.1] 02/28/2015  . Fibromyalgia [M79.7] 02/28/2015  . Family history of diabetes mellitus [Z83.3] 02/28/2015  . H/O alcohol abuse [Z87.898]     Total Time spent with patient: 1 hour  Subjective:   [name] is a 56 y.o. female patient admitted with AMS  HPI:  [name] is an 56 y.o. female, seen, chart reviewed for this face-to-face psychiatric consultation and evaluation. Patient is awake, alert, oriented to herself but continued to be poor historian unable to tell me what happened to her before coming to the hospital. Patient stated she has been confused, does not remember regarding her treatment and also keep searching for her papers, prescriptions which  were not in her purse. Patient has lacerations on her left eye secondary to recent fall and also reportedly having shaking real bad on her legs and hands. Patient also reportedly not able to talk well since came to the hospital. Patient was discharged from the [place] center on 01/18/2017 and she is not able to remember if she has been receiving outpatient medication management from a the family service of [place] or [facility]. 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 Patient stated she wants to go home and at the same time she could not tell me if she is feeling well and able to participate in her ADLs. Patient stated she is feeling fine and denies current suicidal/homicidal ideation, intention or plans. Review of medications indicated she was discharged from hospital on gabapentin 300 mg 2 times daily lamotrigine 50 mg daily morning and 75 mg at bedtime tramadol 50 mg daily trazodone 150 mg at bedtime and lithium 300 mg twice daily. Patient has tremors on both hands and also on right shoulder objective during evaluation. Patient serum lithium level is 1.20, which seems to be therapeutic level at the higher end.  Below information from behavioral health assessment has been reviewed by me and I agreed with the findings. Pt was a poor historian for this assessment. Sitter advised of pt's behavior that she observed including responding to internal stimuli. Pt's answers are suspect due to altered mental status and contradictions of informaition in pt's record and other recent assessments. Pt denies SI, HI, SHI and AVH. Pt denies any hx of aggression or SA. Pt's BAL and UDS were negative fro all substances in the ED tonight. Pt denies  living with a roommate. Pt has ben admitted to [facility] in 2017 and 2018 and per pt record has been previously diagnosed with Bipolar D/O and PTSD.   Pt was dressed in a hospital gown and wandering around the room, lying down, pushing the TA machine with  her feet and responding to internal stimuli during the assessment. Pt was alert, uncooperative and and alternatively pleasant and then, irritable. Pt greeted this assessor warmly & politely and then, kept poor eye contact, spoke in an often irritable, harsh tone and at a normal pace. On many occasions pt either delayed in answering questions or selectively ignored questions until asked repeatedly. Pt moved in a shuffling manner when moving. Pt's thought process was alternatively coherent/relevant and incoherent. Pt's judgement/insight seemed impaired.  Several ndications of delusional thinking or response to internal stimuli were observed during the assessment with looking into space and mumbling words incoherently. Pt's mood seemed depressed based on symptoms and anxious based on behavior. Pt's blunted affect was congruent.  Pt was not oriented for most of the assessment. .   Past Psychiatric History: Patient has been diagnosed with bipolar disorder and posttraumatic stress disorder and has multiple acute psychiatric hospitalization at behavioral health center and also recent [facility].  Risk to Self: Suicidal Ideation: No (DENIES) Suicidal Intent: No Is patient at risk for suicide?: No Suicidal Plan?: No Access to Means: No (UNRESPONSIVE- PREVIOUSLY REPORTED NO ACCESS) What has been your use of drugs/alcohol within the last 12 months?:  (DENIES USE ALTHOUGH HX OF POLYSUBSTANCE DEPENDENCE) How many times?:  (0) Other Self Harm Risks:  (NONE REPORTED) Triggers for Past Attempts: None known Intentional Self Injurious Behavior: None Risk to Others: Homicidal Ideation: No (DENIES) Thoughts of Harm to Others: No (DENIES) Current Homicidal Intent: No Current Homicidal Plan: No Access to Homicidal Means: No Identified Victim:  (NA) History of harm to others?: No (NONE REPORTED) Assessment of Violence: None Noted Violent Behavior Description:  (NA) Does patient have  access to weapons?: No Criminal Charges Pending?: No Does patient have a court date: No Prior Inpatient Therapy: Prior Inpatient Therapy: Yes Prior Therapy Dates:  (2017, 2018) Prior Therapy Facilty/Provider(s):  [facility]) Reason for Treatment:  (PER PT RECORD, BIPOLAR D/O) Prior Outpatient Therapy: Prior Outpatient Therapy: Yes Prior Therapy Dates:  (2017 PER PT RECORD) Prior Therapy Facilty/Provider(s):  ([facility] PER PT RECORD) Reason for Treatment:  (BIPOLAR D/O PER PT RECORD) Does patient have an ACCT team?: No Does patient have Intensive In-House Services?  : No Does patient have [facility] services? : No Does patient have [facility] services?: No  Past Medical History:  Past Medical History:  Diagnosis Date  . Alcohol abuse   . Anemia    during chemo  . Anxiety    At age 65  . Arthritis Dx 2010  . Bipolar disorder ([place])   . Cancer ([place])    breast mets to brain  . Chronic pain   . Complication of anesthesia   . Depression   . Fibromyalgia Dx 2005  . GERD (gastroesophageal reflux disease)   . Headache    hx  migraines  . Opiate dependence ([place])   . PONV (postoperative nausea and vomiting)   . Port-a-cath in place   . PTSD (post-traumatic stress disorder)     Past Surgical History:  Procedure Laterality Date  . APPLICATION OF CRANIAL NAVIGATION N/A 08/14/2016   Procedure: APPLICATION OF CRANIAL NAVIGATION;  Surgeon: Erline Levine, MD;  Location:  [place] [facility];  Service: Neurosurgery;  Laterality: N/A;  . BREAST RECONSTRUCTION Left    with silicone implant  . CRANIOTOMY N/A 08/14/2016   Procedure: CRANIOTOMY TUMOR EXCISION WITH Lucky Rathke;  Surgeon: Erline Levine, MD;  Location: [place] [facility];  Service: Neurosurgery;  Laterality: N/A;  . FIBULA FRACTURE SURGERY Left   . MASTECTOMY Left   . RADIOLOGY WITH ANESTHESIA N/A 07/23/2016   Procedure: MRI OF BRAIN WITH AND WITHOUT;  Surgeon: Medication Radiologist, MD;  Location: [facility];  Service: Radiology;  Laterality: N/A;  .  RADIOLOGY WITH ANESTHESIA N/A 09/08/2016   Procedure: MRI OF BRAIN WITH AND WITHOUT CONTRAST;  Surgeon: Medication Radiologist, MD;  Location: [facility];  Service: Radiology;  Laterality: N/A;  . RADIOLOGY WITH ANESTHESIA N/A 12/10/2016   Procedure: MRI OF BRAIN WITH AND WITHOUT;  Surgeon: Medication Radiologist, MD;  Location: [place];  Service: Radiology;  Laterality: N/A;  . RADIOLOGY WITH ANESTHESIA N/A 03/02/2017   Procedure: MRI of BRAIN W and W/OUT CONTRAST;  Surgeon: Medication Radiologist, MD;  Location: [place];  Service: Radiology;  Laterality: N/A;  . right power port placement Right    Family History:  Family History  Problem Relation Age of Onset  . Diabetes Mother   . Bipolar disorder Mother   . CAD Father    Family Psychiatric  History: Noncontributory Social History:  History  Alcohol Use No    Comment: no ETOH since 08/22/12     History  Drug Use No    Comment: prescription opiates from the street daily until Sunday    Social History   Social History  . Marital status: Single    Spouse name: N/A  . Number of children: N/A  . Years of education: N/A   Social History Main Topics  . Smoking status: Current Every Day Smoker    Packs/day: 0.25    Types: Cigarettes  . Smokeless tobacco: Never Used     Comment: Pt is on Chantix at present time  . Alcohol use No     Comment: no ETOH since 08/22/12  . Drug use: No     Comment: prescription opiates from the street daily until Sunday  . Sexual activity: No     Comment: ablation   Other Topics Concern  . None   Social History Narrative  . None   Additional Social History:    Allergies:   Allergies  Allergen Reactions  . Demerol Itching and Nausea And Vomiting  . Erythromycin Rash    Labs:  Results for orders placed or performed during the hospital encounter of 03/16/17 (from the past 48 hour(s))  CBC with Differential/Platelet     Status: Abnormal   Collection Time: 03/16/17  4:30 PM  Result Value Ref Range    WBC 4.5 4.0 - 10.5 K/uL   RBC 3.75 (L) 3.87 - 5.11 MIL/uL   Hemoglobin 11.5 (L) 12.0 - 15.0 g/dL   HCT 36.3 36.0 - 46.0 %   MCV 96.8 78.0 - 100.0 fL   MCH 30.7 26.0 - 34.0 pg   MCHC 31.7 30.0 - 36.0 g/dL   RDW 12.7 11.5 - 15.5 %   Platelets 180 150 - 400 K/uL   Neutrophils Relative % 68 %   Neutro Abs 3.1 1.7 - 7.7 K/uL   Lymphocytes Relative 22 %   Lymphs Abs 1.0 0.7 - 4.0 K/uL   Monocytes Relative 8 %   Monocytes Absolute 0.4 0.1 - 1.0 K/uL   Eosinophils Relative 2 %  Eosinophils Absolute 0.1 0.0 - 0.7 K/uL   Basophils Relative 0 %   Basophils Absolute 0.0 0.0 - 0.1 K/uL  Comprehensive metabolic panel     Status: Abnormal   Collection Time: 03/16/17  4:30 PM  Result Value Ref Range   Sodium 139 135 - 145 mmol/L   Potassium 3.9 3.5 - 5.1 mmol/L   Chloride 105 101 - 111 mmol/L   CO2 24 22 - 32 mmol/L   Glucose, Bld 96 65 - 99 mg/dL   BUN 5 (L) 6 - 20 mg/dL   Creatinine, Ser 0.79 0.44 - 1.00 mg/dL   Calcium 9.8 8.9 - 10.3 mg/dL   Total Protein 5.5 (L) 6.5 - 8.1 g/dL   Albumin 3.8 3.5 - 5.0 g/dL   AST 15 15 - 41 U/L   ALT 11 (L) 14 - 54 U/L   Alkaline Phosphatase 34 (L) 38 - 126 U/L   Total Bilirubin 0.5 0.3 - 1.2 mg/dL   GFR calc non Af Amer >60 >60 mL/min   GFR calc Af Amer >60 >60 mL/min    Comment: (NOTE) The eGFR has been calculated using the CKD EPI equation. This calculation has not been validated in all clinical situations. eGFR's persistently <60 mL/min signify possible Chronic Kidney Disease.    Anion gap 10 5 - 15  Ethanol     Status: None   Collection Time: 03/16/17  4:30 PM  Result Value Ref Range   Alcohol, Ethyl (B) <5 <5 mg/dL    Comment:        LOWEST DETECTABLE LIMIT FOR SERUM ALCOHOL IS 5 mg/dL FOR MEDICAL PURPOSES ONLY   Acetaminophen level     Status: Abnormal   Collection Time: 03/16/17  4:30 PM  Result Value Ref Range   Acetaminophen (Tylenol), Serum <10 (L) 10 - 30 ug/mL    Comment:        THERAPEUTIC CONCENTRATIONS  VARY SIGNIFICANTLY. A RANGE OF 10-30 ug/mL MAY BE AN EFFECTIVE CONCENTRATION FOR MANY PATIENTS. HOWEVER, SOME ARE BEST TREATED AT CONCENTRATIONS OUTSIDE THIS RANGE. ACETAMINOPHEN CONCENTRATIONS >150 ug/mL AT 4 HOURS AFTER INGESTION AND >50 ug/mL AT 12 HOURS AFTER INGESTION ARE OFTEN ASSOCIATED WITH TOXIC REACTIONS.   Salicylate level     Status: None   Collection Time: 03/16/17  4:30 PM  Result Value Ref Range   Salicylate Lvl <8.3 2.8 - 30.0 mg/dL  I-stat troponin, ED     Status: None   Collection Time: 03/16/17  4:33 PM  Result Value Ref Range   Troponin i, poc 0.00 0.00 - 0.08 ng/mL   Comment 3            Comment: Due to the release kinetics of cTnI, a negative result within the first hours of the onset of symptoms does not rule out myocardial infarction with certainty. If myocardial infarction is still suspected, repeat the test at appropriate intervals.   Lithium level     Status: None   Collection Time: 03/16/17  5:11 PM  Result Value Ref Range   Lithium Lvl 1.20 0.60 - 1.20 mmol/L  Urinalysis, Routine w reflex microscopic     Status: Abnormal   Collection Time: 03/16/17  7:15 PM  Result Value Ref Range   Color, Urine YELLOW YELLOW   APPearance CLEAR CLEAR   Specific Gravity, Urine 1.009 1.005 - 1.030   pH 6.0 5.0 - 8.0   Glucose, UA NEGATIVE NEGATIVE mg/dL   Hgb urine dipstick SMALL (A) NEGATIVE   Bilirubin Urine  NEGATIVE NEGATIVE   Ketones, ur 20 (A) NEGATIVE mg/dL   Protein, ur NEGATIVE NEGATIVE mg/dL   Nitrite NEGATIVE NEGATIVE   Leukocytes, UA LARGE (A) NEGATIVE   RBC / HPF 0-5 0 - 5 RBC/hpf   WBC, UA TOO NUMEROUS TO COUNT 0 - 5 WBC/hpf   Bacteria, UA NONE SEEN NONE SEEN   Squamous Epithelial / LPF 0-5 (A) NONE SEEN   Mucous PRESENT    Hyaline Casts, UA PRESENT   Rapid urine drug screen (hospital performed)     Status: None   Collection Time: 03/16/17  7:15 PM  Result Value Ref Range   Opiates NONE DETECTED NONE DETECTED   Cocaine NONE DETECTED  NONE DETECTED   Benzodiazepines NONE DETECTED NONE DETECTED   Amphetamines NONE DETECTED NONE DETECTED   Tetrahydrocannabinol NONE DETECTED NONE DETECTED   Barbiturates NONE DETECTED NONE DETECTED    Comment:        DRUG SCREEN FOR MEDICAL PURPOSES ONLY.  IF CONFIRMATION IS NEEDED FOR ANY PURPOSE, NOTIFY LAB WITHIN 5 DAYS.        LOWEST DETECTABLE LIMITS FOR URINE DRUG SCREEN Drug Class       Cutoff (ng/mL) Amphetamine      1000 Barbiturate      200 Benzodiazepine   532 Tricyclics       992 Opiates          300 Cocaine          300 THC              50   Culture, Urine     Status: Abnormal   Collection Time: 03/16/17  7:15 PM  Result Value Ref Range   Specimen Description URINE, RANDOM    Special Requests NONE    Culture MULTIPLE SPECIES PRESENT, SUGGEST RECOLLECTION (A)    Report Status 03/18/2017 FINAL   Ammonia     Status: None   Collection Time: 03/16/17  9:00 PM  Result Value Ref Range   Ammonia 16 9 - 35 umol/L  RPR     Status: None   Collection Time: 03/16/17  9:00 PM  Result Value Ref Range   RPR Ser Ql Non Reactive Non Reactive    Comment: (NOTE) Performed At: [facility] [place], [place] [id number] Lindon Romp MD QI:[id number]   Basic metabolic panel     Status: None   Collection Time: 03/17/17  5:01 AM  Result Value Ref Range   Sodium 141 135 - 145 mmol/L   Potassium 3.8 3.5 - 5.1 mmol/L   Chloride 107 101 - 111 mmol/L   CO2 26 22 - 32 mmol/L   Glucose, Bld 94 65 - 99 mg/dL   BUN 6 6 - 20 mg/dL   Creatinine, Ser 0.71 0.44 - 1.00 mg/dL   Calcium 9.4 8.9 - 10.3 mg/dL   GFR calc non Af Amer >60 >60 mL/min   GFR calc Af Amer >60 >60 mL/min    Comment: (NOTE) The eGFR has been calculated using the CKD EPI equation. This calculation has not been validated in all clinical situations. eGFR's persistently <60 mL/min signify possible Chronic Kidney Disease.    Anion gap 8 5 - 15  Glucose, capillary     Status: None    Collection Time: 03/17/17  7:59 AM  Result Value Ref Range   Glucose-Capillary 97 65 - 99 mg/dL  Glucose, capillary     Status: Abnormal   Collection Time: 03/18/17 10:27 AM  Result Value Ref Range   Glucose-Capillary 128 (H) 65 - 99 mg/dL    Current Facility-Administered Medications  Medication Dose Route Frequency Provider Last Rate Last Dose  . 0.9 %  sodium chloride infusion  250 mL Intravenous PRN Vianne Bulls, MD      . anastrozole (ARIMIDEX) tablet 1 mg  1 mg Oral Daily Vianne Bulls, MD   1 mg at 03/18/17 1013  . cefTRIAXone (ROCEPHIN) 1 g in dextrose 5 % 50 mL IVPB  1 g Intravenous Q24H Vianne Bulls, MD   Stopped at 03/18/17 [phone]  . enoxaparin (LOVENOX) injection 40 mg  40 mg Subcutaneous Q24H Vianne Bulls, MD   40 mg at 03/18/17 1026  . gabapentin (NEURONTIN) capsule 600 mg  600 mg Oral TID Vianne Bulls, MD   600 mg at 03/18/17 1013  . ibuprofen (ADVIL,MOTRIN) tablet 800 mg  800 mg Oral Q6H PRN Vianne Bulls, MD   800 mg at 03/18/17 1026  . iopamidol (ISOVUE-300) 61 % injection           . lamoTRIgine (LAMICTAL) tablet 150 mg  150 mg Oral Daily Vianne Bulls, MD   150 mg at 03/18/17 1013  . lidocaine-prilocaine (EMLA) cream 1 application  1 application Topical PRN Ilene Qua Opyd, MD      . lithium carbonate capsule 300 mg  300 mg Oral BID WC Ilene Qua Opyd, MD   300 mg at 03/18/17 1013  . loratadine (CLARITIN) tablet 10 mg  10 mg Oral Daily Vianne Bulls, MD   10 mg at 03/18/17 1013  . LORazepam (ATIVAN) tablet 1 mg  1 mg Oral Q4H PRN Oswald Hillock, MD   1 mg at 03/18/17 1013  . magic mouthwash  5 mL Oral QID PRN Vianne Bulls, MD      . pantoprazole (PROTONIX) EC tablet 40 mg  40 mg Oral Daily Vianne Bulls, MD   40 mg at 03/18/17 1013  . potassium chloride SA (K-DUR,KLOR-CON) CR tablet 10 mEq  10 mEq Oral Daily Vianne Bulls, MD   10 mEq at 03/18/17 1012  . QUEtiapine (SEROQUEL) tablet 50 mg  50 mg Oral BID Vianne Bulls, MD   50 mg at 03/18/17 1012  . sodium  chloride flush (NS) 0.9 % injection 10-40 mL  10-40 mL Intracatheter Q12H Oswald Hillock, MD      . sodium chloride flush (NS) 0.9 % injection 3 mL  3 mL Intravenous Q12H Timothy S Opyd, MD      . sodium chloride flush (NS) 0.9 % injection 3 mL  3 mL Intravenous PRN Vianne Bulls, MD      . traMADol (ULTRAM) tablet 50 mg  50 mg Oral Q12H PRN Vianne Bulls, MD      . traZODone (DESYREL) tablet 200 mg  200 mg Oral QHS Vianne Bulls, MD   200 mg at 03/17/17 2303    Musculoskeletal: Strength & Muscle Tone: decreased Gait & Station: unsteady Patient leans: N/A  Psychiatric Specialty Exam: Physical Exam as per history and physical   ROS generalized weakness, shaking both upper arms and right shoulder and questionable lithium toxicity even though this seems to be therapeutic range. Patient continued to be confused and not able to answer most of the questions.  No Fever-chills, No Headache, No changes with Vision or hearing, reports vertigo No problems swallowing food or Liquids, No Chest pain, Cough or Shortness of  Breath, No Abdominal pain, No Nausea or Vommitting, Bowel movements are regular, No Blood in stool or Urine, No dysuria, No new skin rashes or bruises, No new joints pains-aches,  No new weakness, tingling, numbness in any extremity, No recent weight gain or loss, No polyuria, polydypsia or polyphagia,  A full 10 point Review of Systems was done, except as stated above, all other Review of Systems were negative.   Blood pressure (!) 106/41, pulse 96, temperature 98.3 F (36.8 C), temperature source Oral, resp. rate 18, SpO2 100 %.There is no height or weight on file to calculate BMI.  General Appearance: Bizarre and Guarded  Eye Contact:  Fair  Speech:  Blocked, Slow and Slurred  Volume:  Decreased  Mood:  Anxious  Affect:  Flat  Thought Process:  Disorganized and Irrelevant  Orientation:  Other:  Oriented to herself only  Thought Content:  Illogical, Rumination and  Tangential  Suicidal Thoughts:  No  Homicidal Thoughts:  No  Memory:  Immediate;   Fair Recent;   Poor Remote;   Fair  Judgement:  Impaired  Insight:  Shallow  Psychomotor Activity:  Decreased and Restlessness  Concentration:  Concentration: Fair and Attention Span: Poor  Recall:  [employer] of Knowledge:  Fair  Language:  Good  Akathisia:  Negative  Handed:  Right  AIMS (if indicated):     Assets:  Desire for Improvement Housing Leisure Time Resilience  ADL's:  Impaired  Cognition:  Impaired,  Moderate  Sleep:        Treatment Plan Summary: 56 years old female with bipolar disorder admitted with the acute encephalopathy. Patient has recent acute psychiatric hospitalization and currently presented with confusion, lithium levels at 1.20 which is considered therapeutic at the higher end.  Bipolar disorder with most recent episode unspecified Questionable lithium toxicity  Will reduce her lithium level to 450 mg daily and monitor for the symptom lithium level Continue safety sitter as patient has fall risk Patient denied suicidal/homicidal ideation and also psychosis. Unit social service to contact family members regarding collateral information  Appreciate psychiatric consultation and follow up as clinically required Please contact [phone] or [phone] if needs further assistance  Disposition: Patient does not meet criteria for psychiatric inpatient admission. Supportive therapy provided about ongoing stressors.  Ambrose Finland, MD 03/18/2017 1:19 PM

## 2017-03-19 DIAGNOSIS — N390 Urinary tract infection, site not specified: Secondary | ICD-10-CM

## 2017-03-19 LAB — GLUCOSE, CAPILLARY: Glucose-Capillary: 105 mg/dL — ABNORMAL HIGH (ref 65–99)

## 2017-03-19 MED ORDER — GABAPENTIN 300 MG PO CAPS
600.0000 mg | ORAL_CAPSULE | Freq: Two times a day (BID) | ORAL | Status: DC
  Administered 2017-03-19 – 2017-04-04 (×32): 600 mg via ORAL
  Filled 2017-03-19 (×32): qty 2

## 2017-03-19 MED ORDER — ENSURE ENLIVE PO LIQD
237.0000 mL | Freq: Three times a day (TID) | ORAL | Status: DC
  Administered 2017-03-20 – 2017-04-07 (×19): 237 mL via ORAL

## 2017-03-19 NOTE — Progress Notes (Signed)
[facility]  PROGRESS NOTE  [name] OMV:[id number] DOB: 24-Jan-1961 DOA: 03/16/2017 PCP: Arnoldo Morale, MD   Brief HPI:    56 year old female with history of alcohol abuse, bipolar disorder, left breast cancer with brain metastasis status post mastectomy and craniectomy with radiation to brain and breast came to ED with confusion and hallucinations. Patient started on IV Rocephin for acute cystitis.   Subjective   Patient seen and examined, Feels better this morning. Denies tremors dose of lithium was changed to 450 mg daily.    Assessment/Plan:     1. Acute encephalopathy- improving, patient has  intermittent episodes of confusion and hallucinations this has been going on since patient had suboccipital craniectomy for resection of posterior fossa metastasis September 2017 requiring multiple hospitalizations. Patient was  started on Rocephin for possible UTI, urine culture  grew multiple species. MRI brain earlier this month was normal CT head was negative during this admission. Rocephin was discontinued  2. Acute cystitis- patient had abnormal UA, started on IV Rocephin empirically . Urine culture grew multiple species. Rocephin was discontinued.  3. Bipolar disorder- patient was seen by psychiatry and dose of lithium changed to  450 mg by mouth daily at bedtime. 4. Tremors- likely from lithium, dose of lithium changed to 450 mg once a day.  5. History of breast cancer with brain metastasis- patient is status post mastectomy and axillary node dissection, suboccipital craniectomy for resection of posterior fossa metastasis, radiation to brain and breast. Continue Arimidex.    DVT prophylaxis: Lovenox  Code Status: Full code  Family Communication: No family present at bedside  Disposition Plan: Pending improvement in mental status  Antibiotics:   Anti-infectives    Start     Dose/Rate Route Frequency Ordered Stop   03/17/17 2000  cefTRIAXone (ROCEPHIN) 1 g in dextrose 5  % 50 mL IVPB  Status:  Discontinued     1 g 100 mL/hr over 30 Minutes Intravenous Every 24 hours 03/16/17 2043 03/18/17 1559   03/16/17 2015  cefTRIAXone (ROCEPHIN) 1 g in dextrose 5 % 50 mL IVPB     1 g 100 mL/hr over 30 Minutes Intravenous  Once 03/16/17 2000 03/16/17 2041       Objective   Vitals:   03/18/17 1031 03/18/17 1711 03/19/17 0433 03/19/17 0809  BP: (!) 106/41 116/65 (!) 73/39 (!) 104/57  Pulse: 96 87 79 89  Resp: 18 19 18 18   Temp:  97.9 F (36.6 C) 98.3 F (36.8 C) 98.7 F (37.1 C)  TempSrc:  Oral Oral Oral  SpO2: 100% 100% 97% 95%  Weight:   40.8 kg (90 lb)     Intake/Output Summary (Last 24 hours) at 03/19/17 1438 Last data filed at 03/19/17 0935  Gross per 24 hour  Intake              713 ml  Output              801 ml  Net              -88 ml   Filed Weights   03/19/17 0433  Weight: 40.8 kg (90 lb)     Physical Examination:  Physical Exam: Eyes: No icterus, extraocular muscles intact  Mouth: Oral mucosa is moist, no lesions on palate,  Neck: Supple, no deformities, masses, or tenderness Lungs: Normal respiratory effort, bilateral clear to auscultation, no crackles or wheezes.  Heart: Regular rate and rhythm, S1 and S2 normal, no murmurs, rubs auscultated Abdomen: BS  normoactive,soft,nondistended,non-tender to palpation,no organomegaly Extremities: No pretibial edema, no erythema, no cyanosis, no clubbing Neuro : Alert and oriented to time, place and person, No focal deficits Skin: No rashes seen on exam     Data Reviewed: I have personally reviewed following labs and imaging studies  CBG:  Recent Labs Lab 03/17/17 0759 03/18/17 1027 03/19/17 0815  GLUCAP 97 128* 105*    CBC:  Recent Labs Lab 03/16/17 1630 03/16/17 2134  WBC 4.5  --   NEUTROABS 3.1  --   HGB 11.5*  --   HCT 36.3 34.3  MCV 96.8  --   PLT 180  --     Basic Metabolic Panel:  Recent Labs Lab 03/16/17 1630 03/17/17 0501  NA 139 141  K 3.9 3.8  CL  105 107  CO2 24 26  GLUCOSE 96 94  BUN 5* 6  CREATININE 0.79 0.71  CALCIUM 9.8 9.4    Recent Results (from the past 240 hour(s))  Culture, Urine     Status: Abnormal   Collection Time: 03/16/17  7:15 PM  Result Value Ref Range Status   Specimen Description URINE, RANDOM  Final   Special Requests NONE  Final   Culture MULTIPLE SPECIES PRESENT, SUGGEST RECOLLECTION (A)  Final   Report Status 03/18/2017 FINAL  Final     Liver Function Tests:  Recent Labs Lab 03/16/17 1630  AST 15  ALT 11*  ALKPHOS 34*  BILITOT 0.5  PROT 5.5*  ALBUMIN 3.8   No results for input(s): LIPASE, AMYLASE in the last 168 hours.  Recent Labs Lab 03/16/17 2100  AMMONIA 16    Studies: Ct Chest W Contrast  Result Date: 03/18/2017 CLINICAL DATA:  Breast cancer.  Short breath.  Brain metastasis. EXAM: CT CHEST WITH CONTRAST TECHNIQUE: Multidetector CT imaging of the chest was performed during intravenous contrast administration. CONTRAST:  63mL ISOVUE-300 IOPAMIDOL (ISOVUE-300) INJECTION 61% COMPARISON:  CT 11/10/2016 FINDINGS: Cardiovascular: Port in RIGHT chest wall with tip the distal SVC. No significant vascular findings. Normal heart size. No pericardial effusion. Mediastinum/Nodes: No axillary supraclavicular adenopathy. No mediastinal hilar adenopathy Lungs/Pleura: Mild atelectasis lung bases. Mild linear scarring LEFT lung apex. No suspicious nodularity. Pulmonary edema or pulmonary infection. Upper Abdomen: Limited view of the liver, kidneys, pancreas are unremarkable. Normal adrenal glands. Multiple small hepatic hypodensities likely represent benign cysts for not comparison Musculoskeletal: Scatter sclerotic lesions in the spine for stable. Post LEFT mastectomy with breast prosthetic. IMPRESSION: 1. No evidence of breast cancer progression. 2. No acute pulmonary parenchymal findings. 3. Stable sclerotic lesions in the thoracic spine. Electronically Signed   By: Suzy Bouchard M.D.   On: 03/18/2017  12:14    Scheduled Meds: . anastrozole  1 mg Oral Daily  . enoxaparin (LOVENOX) injection  40 mg Subcutaneous Q24H  . gabapentin  600 mg Oral BID  . lamoTRIgine  150 mg Oral Daily  . lithium carbonate  450 mg Oral QHS  . loratadine  10 mg Oral Daily  . pantoprazole  40 mg Oral Daily  . potassium chloride  10 mEq Oral Daily  . QUEtiapine  50 mg Oral QHS  . sodium chloride flush  10-40 mL Intracatheter Q12H  . sodium chloride flush  3 mL Intravenous Q12H  . traZODone  200 mg Oral QHS      Time spent: 25 min  [facility] Pager [phone]. If 7PM-7AM, please contact night-coverage at [email or web address], Office  [phone]  password [facility] 03/19/2017, 2:38 PM  LOS: 2 days

## 2017-03-19 NOTE — Progress Notes (Signed)
Patient impulsive and high fall risk, not following commands.

## 2017-03-19 NOTE — Progress Notes (Signed)
Entered patient room due to bed alarm sounding and patient sitting at edge of bed. Patient became tearful and stated that she wanted to talk to someone. Stated her "daddy was supposed to be here, but he's not". Offered to call chaplain for patient. She agreed.  Ambulated patient to bathroom with 2 person assist. Gait remains unsteady. Chaplain consult placed. Alexica Schlossberg, Bryn Gulling

## 2017-03-19 NOTE — Progress Notes (Signed)
Spoke with son Avianna Moynahan via telephone regarding initiation of soft waist belt restraint.  Son verbalized he understood why we are using the restraint and thanked [place] for keeping his mom safe.

## 2017-03-19 NOTE — Clinical Social Work Note (Addendum)
CSW following patient's progress. This CSW consulted with CSW Loren Racer this morning regarding patient has she has significant history with patient. Patient has psychiatric issues and stage IV breast cancer. [name] has talked with patient about ALF placement, but she has refused.   Patient remains confused, is impulsive and a high fall risk, requiring a sitter and significant attention by nursing staff. Patient is aware that she will discharge on Saturday and plans to go home at discharge. Contact has been made with son by nurse case manager and  nursing staff, and he is agreeable to home health services. Social work will continue to follow and assist if needed prior to discharge.  [name], MSW, LCSW Licensed Clinical Social Worker [place] [phone]

## 2017-03-19 NOTE — Progress Notes (Signed)
Initial Nutrition Assessment  DOCUMENTATION CODES:   Underweight  INTERVENTION:   -Ensure Enlive po TID, each supplement provides 350 kcal and 20 grams of protein  NUTRITION DIAGNOSIS:   Predicted suboptimal nutrient intake related to cancer and cancer related treatments as evidenced by percent weight loss.  GOAL:   Patient will meet greater than or equal to 90% of their needs  MONITOR:   PO intake, Supplement acceptance, Labs, Weight trends, Skin, I & O's  REASON FOR ASSESSMENT:   Rounds    ASSESSMENT:   [name] is a 56 y.o. 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  Pt admitted with acute encephalopathy.   RD asked to evaluate pt per request of [name], RD, LDN, CSO) due to triggering for significant wt loss over the past several months. Pt with stage IV left-sided breast cancer involving bone and central nervous system. Current treatment is anastrozole daily and herceptin every 21 days (next dose 03/26/17), which she tolerates well.   Pt very confused and impulsive at time of visit. She is consuming lunch, but eating very little (noted < 25% meal completion). Pt shouting "get me out" at time of visit. Documented meal completion 0-50%.   Pt appeared very thin and frail, however, nutrition-focused physical exam deferred at this time, due to mental status.   Per wt hx, pt has experienced a 30% wt loss over the past month and a 38.3% wt loss over the past 3 months, all of which are significant. RD does suspect some degree of malnutrition and poor oral intake PTA, however, unable to confirm at this time. Will add supplement in attempt to maximize intake.   Labs reviewed: CBGS: 97-128.   Diet Order:  Diet  regular Room service appropriate? Yes; Fluid consistency: Thin  Skin:  Reviewed, no issues  Last BM:  03/18/17  Height:   Ht Readings from Last 1 Encounters:  03/05/17 5\' 5"  (1.651 m)    Weight:   Wt Readings from Last 1 Encounters:  03/19/17 90 lb (40.8 kg)    Ideal Body Weight:  56.8 kg  BMI:  Body mass index is 14.98 kg/m.  Estimated Nutritional Needs:   Kcal:  1250-1450  Protein:  55-70 grams  Fluid:  > 1.2 L  EDUCATION NEEDS:   Education needs no appropriate at this time  Lunah Losasso A. Jimmye Norman, RD, LDN, CDE Pager: [phone] After hours Pager: [phone]

## 2017-03-19 NOTE — Progress Notes (Signed)
   Introduced Art gallery manager  Patient emotive but did not want to talk at this time.  Please page on-call chaplain or contact the spiritual care department, as needed.  Will follow, as needed.  - Rev. [place] MDiv ThM

## 2017-03-19 NOTE — Care Management Note (Addendum)
Case Management Note  Patient Details  Name: [name] MRN: [id number] Date of Birth: May 06, 1961  Subjective/Objective:     CM following for progression and d/c planning.                Action/Plan: 03/19/2017 Met with pt re HH needs, pt is active with [facility] for [facility] [place] aide and social work. Pt states that she also has a HH aide most likely this is PCS provided by her Medicaid, pt agrees that this is correct, however difficult at this time to determine if pt is reliable. Upon questioning pt about home support and d/c needs she became upset and tearful stating that there were too many people coming in and she was unable to understand. Pt stated that she was very angry that she could not go home today then when preparing for d/c support she assured this CM that she was not ready to go home today.  Pt states that she is assisted at home by a roommate, [name], who helps her however she would not agree to allowing this  CM to try to contact [name].  This CM did attempt to contact the pt son [name], as listed in pt record and names as POA, to clarify d/c needs. Await reply At 11am 03/19/17. Discussed with CSW, Crawford Givens. [place] notified of pt need for ongoing [facility] services.  2:15 pm spoke with pt son, [name], who lives in [place], this CM informed him of plan to d/c this pt tomorrow and attempted to clarify pt needs. Per Mr Slagel this pt has a HHA who comes in 2x a day, he is agreeable to a [facility] for medication management as he sees this as one of her biggest problems. Will ask MD to order [facility] and Education officer, museum.  Pt son states that [name] checks on the pt intermittantly however is not there to assist with needs on a daily basis.   Expected Discharge Date:     03/20/2017             Expected Discharge Plan:  [place]  In-House Referral:  Clinical Social Work  Discharge planning Services  CM Consult  Post Acute Care Choice:  Home Health Choice offered  to:  Patient  DME Arranged:  N/A DME Agency:  NA  HH Arranged:  RN, Social Work, Nurse's Aide [place] Agency:  [facility]  Status of Service:  Completed, signed off  If discussed at [employer], dates discussed:    Additional Comments:  Adron Bene, RN 03/19/2017, 11:17 AM

## 2017-03-20 LAB — GLUCOSE, CAPILLARY: Glucose-Capillary: 85 mg/dL (ref 65–99)

## 2017-03-20 NOTE — Progress Notes (Signed)
Patient is very confused this morning, alert and oriented to self only. Multiple attempts to get up despite posey belt. Staff have been to patient room several times, unable to console. Patient remains anxious/restless.

## 2017-03-20 NOTE — Progress Notes (Signed)
[facility]  PROGRESS NOTE  [name] JJO:[id number] DOB: [birth year] DOA: 03/16/2017 PCP: Arnoldo Morale, MD   Brief HPI:    56 year old female with history of alcohol abuse, bipolar disorder, left breast cancer with brain metastasis status post mastectomy and craniectomy with radiation to brain and breast came to ED with confusion and hallucinations. Patient started on IV Rocephin for acute cystitis.   Subjective   Patient seen and examined, Denies tremors dose of lithium was changed to 450 mg daily. Patient wants to go home, father and sister at bedside.   Assessment/Plan:     1. Acute encephalopathy- improving, patient has  intermittent episodes of confusion and hallucinations this has been going on since patient had suboccipital craniectomy for resection of posterior fossa metastasis September 2017 requiring multiple hospitalizations. Patient was  started on Rocephin for possible UTI, urine culture  grew multiple species. MRI brain earlier this month was normal CT head was negative during this admission. Rocephin was discontinued. At this time patient appears well aware with situation, not sure whether she has clear insight offer limitation with walking. Physical therapy recommends patient to go to skilled nursing facility. I have called psychiatrist was going to see patient in a.m. to determine if patient has decision-making capacity. If patient is deemed to have decision-making capacity, she will be discharged in a.m. 2. Acute cystitis- patient had abnormal UA, started on IV Rocephin empirically . Urine culture grew multiple species. Rocephin was discontinued.  3. Bipolar disorder- patient was seen by psychiatry and dose of lithium changed to  450 mg by mouth daily at bedtime. Psych to see in a.m. to assess decision-making capacity 4. Tremors-resolved, likely from lithium, dose of lithium changed to 450 mg once a day.  5. History of breast cancer with brain metastasis- patient is  status post mastectomy and axillary node dissection, suboccipital craniectomy for resection of posterior fossa metastasis, radiation to brain and breast. Continue Arimidex.    DVT prophylaxis: Lovenox  Code Status: Full code  Family Communication: Discussed with patient's father and sister at bedside  Disposition Plan: Pending improvement in mental status  Antibiotics:   Anti-infectives    Start     Dose/Rate Route Frequency Ordered Stop   03/17/17 2000  cefTRIAXone (ROCEPHIN) 1 g in dextrose 5 % 50 mL IVPB  Status:  Discontinued     1 g 100 mL/hr over 30 Minutes Intravenous Every 24 hours 03/16/17 2043 03/18/17 1559   03/16/17 2015  cefTRIAXone (ROCEPHIN) 1 g in dextrose 5 % 50 mL IVPB     1 g 100 mL/hr over 30 Minutes Intravenous  Once 03/16/17 2000 03/16/17 2041       Objective   Vitals:   03/18/17 1031 03/18/17 1711 03/19/17 0433 03/19/17 0809  BP: (!) 106/41 116/65 (!) 73/39 (!) 104/57  Pulse: 96 87 79 89  Resp: 18 19 18 18   Temp:  97.9 F (36.6 C) 98.3 F (36.8 C) 98.7 F (37.1 C)  TempSrc:  Oral Oral Oral  SpO2: 100% 100% 97% 95%  Weight:   40.8 kg (90 lb)     Intake/Output Summary (Last 24 hours) at 03/19/17 1438 Last data filed at 03/19/17 0935  Gross per 24 hour  Intake              713 ml  Output              801 ml  Net              -  88 ml   Filed Weights   03/19/17 0433  Weight: 40.8 kg (90 lb)     Physical Examination:  Physical Exam: Eyes: No icterus, extraocular muscles intact  Mouth: Oral mucosa is moist, no lesions on palate,  Neck: Supple, no deformities, masses, or tenderness Lungs: Normal respiratory effort, bilateral clear to auscultation, no crackles or wheezes.  Heart: Regular rate and rhythm, S1 and S2 normal, no murmurs, rubs auscultated Abdomen: BS normoactive,soft,nondistended,non-tender to palpation,no organomegaly Extremities: No pretibial edema, no erythema, no cyanosis, no clubbing Neuro : Alert and oriented to time,  place and person, No focal deficits Skin: No rashes seen on exam     Data Reviewed: I have personally reviewed following labs and imaging studies  CBG:  Recent Labs Lab 03/17/17 0759 03/18/17 1027 03/19/17 0815  GLUCAP 97 128* 105*    CBC:  Recent Labs Lab 03/16/17 1630 03/16/17 2134  WBC 4.5  --   NEUTROABS 3.1  --   HGB 11.5*  --   HCT 36.3 34.3  MCV 96.8  --   PLT 180  --     Basic Metabolic Panel:  Recent Labs Lab 03/16/17 1630 03/17/17 0501  NA 139 141  K 3.9 3.8  CL 105 107  CO2 24 26  GLUCOSE 96 94  BUN 5* 6  CREATININE 0.79 0.71  CALCIUM 9.8 9.4    Recent Results (from the past 240 hour(s))  Culture, Urine     Status: Abnormal   Collection Time: 03/16/17  7:15 PM  Result Value Ref Range Status   Specimen Description URINE, RANDOM  Final   Special Requests NONE  Final   Culture MULTIPLE SPECIES PRESENT, SUGGEST RECOLLECTION (A)  Final   Report Status 03/18/2017 FINAL  Final     Liver Function Tests:  Recent Labs Lab 03/16/17 1630  AST 15  ALT 11*  ALKPHOS 34*  BILITOT 0.5  PROT 5.5*  ALBUMIN 3.8   No results for input(s): LIPASE, AMYLASE in the last 168 hours.  Recent Labs Lab 03/16/17 2100  AMMONIA 16    Studies: Ct Chest W Contrast  Result Date: 03/18/2017 CLINICAL DATA:  Breast cancer.  Short breath.  Brain metastasis. EXAM: CT CHEST WITH CONTRAST TECHNIQUE: Multidetector CT imaging of the chest was performed during intravenous contrast administration. CONTRAST:  33mL ISOVUE-300 IOPAMIDOL (ISOVUE-300) INJECTION 61% COMPARISON:  CT 11/10/2016 FINDINGS: Cardiovascular: Port in RIGHT chest wall with tip the distal SVC. No significant vascular findings. Normal heart size. No pericardial effusion. Mediastinum/Nodes: No axillary supraclavicular adenopathy. No mediastinal hilar adenopathy Lungs/Pleura: Mild atelectasis lung bases. Mild linear scarring LEFT lung apex. No suspicious nodularity. Pulmonary edema or pulmonary infection.  Upper Abdomen: Limited view of the liver, kidneys, pancreas are unremarkable. Normal adrenal glands. Multiple small hepatic hypodensities likely represent benign cysts for not comparison Musculoskeletal: Scatter sclerotic lesions in the spine for stable. Post LEFT mastectomy with breast prosthetic. IMPRESSION: 1. No evidence of breast cancer progression. 2. No acute pulmonary parenchymal findings. 3. Stable sclerotic lesions in the thoracic spine. Electronically Signed   By: Suzy Bouchard M.D.   On: 03/18/2017 12:14    Scheduled Meds: . anastrozole  1 mg Oral Daily  . enoxaparin (LOVENOX) injection  40 mg Subcutaneous Q24H  . gabapentin  600 mg Oral BID  . lamoTRIgine  150 mg Oral Daily  . lithium carbonate  450 mg Oral QHS  . loratadine  10 mg Oral Daily  . pantoprazole  40 mg Oral Daily  .  potassium chloride  10 mEq Oral Daily  . QUEtiapine  50 mg Oral QHS  . sodium chloride flush  10-40 mL Intracatheter Q12H  . sodium chloride flush  3 mL Intravenous Q12H  . traZODone  200 mg Oral QHS      Time spent: 25 min  [facility] Pager [phone]. If 7PM-7AM, please contact night-coverage at [email or web address], Office  [phone]  password [email or web address] 03/19/2017, 2:38 PM  LOS: 2 days

## 2017-03-20 NOTE — Evaluation (Signed)
Physical Therapy Evaluation Patient Details Name: [name] MRN: [id number] DOB: 1960/12/26 Today's Date: 03/20/2017   History of Present Illness  Pt is a 56 y/o female presented to the ED with confusion and hallucinations. Of note, pt recently d/c'd from [facility] on 03/01/17 for similar issues. 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  Clinical Impression  Pt presented sitting OOB in recliner chair, awake and willing to participate in therapy session. Pt very confused throughout with fluctuating cognition (see below for details). Pt very unsafe and unsteady with ambulation and requires use of RW and min A. Pt would continue to benefit from skilled physical therapy services at this time while admitted and after d/c to address the below listed limitations in order to improve overall safety and independence with functional mobility.     Follow Up Recommendations SNF;Supervision/Assistance - 24 hour    Equipment Recommendations  None recommended by PT    Recommendations for Other Services       Precautions / Restrictions Precautions Precautions: Fall Precaution Comments: pt with posey belt donned in chair Restrictions Weight Bearing Restrictions: No      Mobility  Bed Mobility               General bed mobility comments: Pt sitting in recliner chair with Posey belt in place when therapist arrived  Transfers Overall transfer level: Needs assistance Equipment used: None Transfers: Sit to/from Stand Sit to Stand: Supervision         General transfer comment: pt impulsive with standing from chair, close supervision for safety  Ambulation/Gait Ambulation/Gait assistance: Min assist Ambulation Distance (Feet): 50 Feet Assistive device: Rolling walker (2 wheeled) Gait Pattern/deviations: Step-through pattern;Decreased step length - right;Decreased step length - left;Decreased stride length;Drifts right/left Gait  velocity: decreased Gait velocity interpretation: Below normal speed for age/gender General Gait Details: pt very unsafe with ambulation requiring constant min A for safety with RW and for navigating safely in hallway.   Stairs            Wheelchair Mobility    Modified Rankin (Stroke Patients Only)       Balance Overall balance assessment: Needs assistance Sitting-balance support: No upper extremity supported;Feet supported Sitting balance-Leahy Scale: Fair     Standing balance support: No upper extremity supported;During functional activity Standing balance-Leahy Scale: Fair Standing balance comment: close min guard for stability                             Pertinent Vitals/Pain Pain Assessment: No/denies pain    Home Living Family/patient expects to be discharged to:: Private residence Living Arrangements: Non-relatives/Friends (pt reported that she has 2-3 friends that she lives with) Available Help at Discharge: Friend(s);Available PRN/intermittently Type of Home: House         Home Equipment: Walker - 2 wheels;Cane - single point Additional Comments: All information obtained from pt; however, very unreliable historian as she was confused throughout and conversation fluctuated from appropriate to very tangential. No family or caregivers were present throughout evaluation.    Prior Function Level of Independence: Independent         Comments: Pt reported that she was independent; however, pt was very confused throughout and questionable how reliable this information is     Hand Dominance        Extremity/Trunk Assessment   Upper Extremity Assessment Upper Extremity Assessment: Generalized weakness (pt  with tremors in bilateral UEs at rest)    Lower Extremity Assessment Lower Extremity Assessment: Generalized weakness    Cervical / Trunk Assessment Cervical / Trunk Assessment: Normal  Communication   Communication: No difficulties   Cognition Arousal/Alertness: Awake/alert Behavior During Therapy: Impulsive;Anxious (tearful) Overall Cognitive Status: No family/caregiver present to determine baseline cognitive functioning Area of Impairment: Attention;Memory;Following commands;Safety/judgement;Awareness;Problem solving                   Current Attention Level: Focused Memory: Decreased short-term memory Following Commands: Follows one step commands inconsistently Safety/Judgement: Decreased awareness of safety;Decreased awareness of deficits Awareness: Intellectual Problem Solving: Slow processing;Decreased initiation;Difficulty sequencing;Requires verbal cues;Requires tactile cues General Comments: pt with fluctuating cognition throughout. One moment pt attentive and appropriately answering questions, and the next moment she was speaking tangentially and incoherent      General Comments      Exercises     Assessment/Plan    PT Assessment Patient needs continued PT services  PT Problem List Decreased strength;Decreased balance;Decreased coordination;Decreased mobility;Decreased cognition;Decreased knowledge of use of DME;Decreased safety awareness;Decreased knowledge of precautions       PT Treatment Interventions DME instruction;Gait training;Stair training;Functional mobility training;Therapeutic activities;Therapeutic exercise;Balance training;Neuromuscular re-education;Cognitive remediation;Patient/family education    PT Goals (Current goals can be found in the Care Plan section)  Acute Rehab PT Goals Patient Stated Goal: to get out of the hospital PT Goal Formulation: Patient unable to participate in goal setting Time For Goal Achievement: 04/03/17 Potential to Achieve Goals: Fair    Frequency Min 3X/week   Barriers to discharge Decreased caregiver support      Co-evaluation               End of Session   Activity Tolerance: Patient tolerated treatment well Patient left: in  chair;with call bell/phone within reach;with chair alarm set;with restraints reapplied Nurse Communication: Mobility status PT Visit Diagnosis: Unsteadiness on feet (R26.81);Other abnormalities of gait and mobility (R26.89);Muscle weakness (generalized) (M62.81)    Time: 7903-8333 PT Time Calculation (min) (ACUTE ONLY): 21 min   Charges:   PT Evaluation $PT Eval Moderate Complexity: 1 Procedure     PT G Codes:        Sherie Don, PT, DPT [place] 03/20/2017, 11:25 AM

## 2017-03-21 LAB — GLUCOSE, CAPILLARY: Glucose-Capillary: 90 mg/dL (ref 65–99)

## 2017-03-21 LAB — LITHIUM LEVEL: Lithium Lvl: 0.37 mmol/L — ABNORMAL LOW (ref 0.60–1.20)

## 2017-03-21 LAB — VITAMIN B1: Vitamin B1 (Thiamine): 114 nmol/L (ref 66.5–200.0)

## 2017-03-21 NOTE — Progress Notes (Signed)
[facility]  PROGRESS NOTE  [name] SWF:[id number] DOB: December 04, 1960 DOA: 03/16/2017 PCP: Arnoldo Morale, MD   Brief HPI:    56 year old female with history of alcohol abuse, bipolar disorder, left breast cancer with brain metastasis status post mastectomy and craniectomy with radiation to brain and breast came to ED with confusion and hallucinations. Patient started on IV Rocephin for acute cystitis.   Subjective   Patient seen and examined, Anxious to go home. Has been seen by psychiatry and deemed not having decision-making capacity for her medical and psychiatric condition.   Assessment/Plan:     1. Acute encephalopathy- improving, patient has  intermittent episodes of confusion and hallucinations this has been going on since patient had suboccipital craniectomy for resection of posterior fossa metastasis September 2017 requiring multiple hospitalizations. Patient was  started on Rocephin for possible UTI, urine culture  grew multiple species. MRI brain earlier this month was normal CT head was negative during this admission. Rocephin was discontinued. Patient was seen by psychiatry and deemed to not have decision-making capacity. 2. Acute cystitis- patient had abnormal UA, started on IV Rocephin empirically . Urine culture grew multiple species. Rocephin was discontinued.  3. Bipolar disorder- patient was seen by psychiatry and dose of lithium changed to  450 mg by mouth daily at bedtime. Psych has seen the patient and deemed not having decision-making capacity. Recheck lithium level today. 4. Tremors-resolved, likely from lithium, dose of lithium changed to 450 mg once a day.  5. History of breast cancer with brain metastasis- patient is status post mastectomy and axillary node dissection, suboccipital craniectomy for resection of posterior fossa metastasis, radiation to brain and breast. Continue Arimidex.    DVT prophylaxis: Lovenox  Code Status: Full code  Family  Communication: Discussed with patient's father and sister at bedside  Disposition Plan: Pending improvement in mental status  Antibiotics:   Anti-infectives    Start     Dose/Rate Route Frequency Ordered Stop   03/17/17 2000  cefTRIAXone (ROCEPHIN) 1 g in dextrose 5 % 50 mL IVPB  Status:  Discontinued     1 g 100 mL/hr over 30 Minutes Intravenous Every 24 hours 03/16/17 2043 03/18/17 1559   03/16/17 2015  cefTRIAXone (ROCEPHIN) 1 g in dextrose 5 % 50 mL IVPB     1 g 100 mL/hr over 30 Minutes Intravenous  Once 03/16/17 2000 03/16/17 2041       Objective   Vitals:   03/18/17 1031 03/18/17 1711 03/19/17 0433 03/19/17 0809  BP: (!) 106/41 116/65 (!) 73/39 (!) 104/57  Pulse: 96 87 79 89  Resp: 18 19 18 18   Temp:  97.9 F (36.6 C) 98.3 F (36.8 C) 98.7 F (37.1 C)  TempSrc:  Oral Oral Oral  SpO2: 100% 100% 97% 95%  Weight:   40.8 kg (90 lb)     Intake/Output Summary (Last 24 hours) at 03/19/17 1438 Last data filed at 03/19/17 0935  Gross per 24 hour  Intake              713 ml  Output              801 ml  Net              -88 ml   Filed Weights   03/19/17 0433  Weight: 40.8 kg (90 lb)     Physical Examination:  Physical Exam: Eyes: No icterus, extraocular muscles intact  Mouth: Oral mucosa is moist, no lesions on palate,  Neck:  Supple, no deformities, masses, or tenderness Lungs: Normal respiratory effort, bilateral clear to auscultation, no crackles or wheezes.  Heart: Regular rate and rhythm, S1 and S2 normal, no murmurs, rubs auscultated Abdomen: BS normoactive,soft,nondistended,non-tender to palpation,no organomegaly Extremities: No pretibial edema, no erythema, no cyanosis, no clubbing Neuro : Alert and oriented to time, place and person, No focal deficits Skin: No rashes seen on exam     Data Reviewed: I have personally reviewed following labs and imaging studies  CBG:  Recent Labs Lab 03/17/17 0759 03/18/17 1027 03/19/17 0815  GLUCAP 97 128*  105*    CBC:  Recent Labs Lab 03/16/17 1630 03/16/17 2134  WBC 4.5  --   NEUTROABS 3.1  --   HGB 11.5*  --   HCT 36.3 34.3  MCV 96.8  --   PLT 180  --     Basic Metabolic Panel:  Recent Labs Lab 03/16/17 1630 03/17/17 0501  NA 139 141  K 3.9 3.8  CL 105 107  CO2 24 26  GLUCOSE 96 94  BUN 5* 6  CREATININE 0.79 0.71  CALCIUM 9.8 9.4    Recent Results (from the past 240 hour(s))  Culture, Urine     Status: Abnormal   Collection Time: 03/16/17  7:15 PM  Result Value Ref Range Status   Specimen Description URINE, RANDOM  Final   Special Requests NONE  Final   Culture MULTIPLE SPECIES PRESENT, SUGGEST RECOLLECTION (A)  Final   Report Status 03/18/2017 FINAL  Final     Liver Function Tests:  Recent Labs Lab 03/16/17 1630  AST 15  ALT 11*  ALKPHOS 34*  BILITOT 0.5  PROT 5.5*  ALBUMIN 3.8   No results for input(s): LIPASE, AMYLASE in the last 168 hours.  Recent Labs Lab 03/16/17 2100  AMMONIA 16    Studies: Ct Chest W Contrast  Result Date: 03/18/2017 CLINICAL DATA:  Breast cancer.  Short breath.  Brain metastasis. EXAM: CT CHEST WITH CONTRAST TECHNIQUE: Multidetector CT imaging of the chest was performed during intravenous contrast administration. CONTRAST:  35mL ISOVUE-300 IOPAMIDOL (ISOVUE-300) INJECTION 61% COMPARISON:  CT 11/10/2016 FINDINGS: Cardiovascular: Port in RIGHT chest wall with tip the distal SVC. No significant vascular findings. Normal heart size. No pericardial effusion. Mediastinum/Nodes: No axillary supraclavicular adenopathy. No mediastinal hilar adenopathy Lungs/Pleura: Mild atelectasis lung bases. Mild linear scarring LEFT lung apex. No suspicious nodularity. Pulmonary edema or pulmonary infection. Upper Abdomen: Limited view of the liver, kidneys, pancreas are unremarkable. Normal adrenal glands. Multiple small hepatic hypodensities likely represent benign cysts for not comparison Musculoskeletal: Scatter sclerotic lesions in the spine  for stable. Post LEFT mastectomy with breast prosthetic. IMPRESSION: 1. No evidence of breast cancer progression. 2. No acute pulmonary parenchymal findings. 3. Stable sclerotic lesions in the thoracic spine. Electronically Signed   By: Suzy Bouchard M.D.   On: 03/18/2017 12:14    Scheduled Meds: . anastrozole  1 mg Oral Daily  . enoxaparin (LOVENOX) injection  40 mg Subcutaneous Q24H  . gabapentin  600 mg Oral BID  . lamoTRIgine  150 mg Oral Daily  . lithium carbonate  450 mg Oral QHS  . loratadine  10 mg Oral Daily  . pantoprazole  40 mg Oral Daily  . potassium chloride  10 mEq Oral Daily  . QUEtiapine  50 mg Oral QHS  . sodium chloride flush  10-40 mL Intracatheter Q12H  . sodium chloride flush  3 mL Intravenous Q12H  . traZODone  200 mg Oral QHS  Time spent: 25 min  [facility] Pager [phone]. If 7PM-7AM, please contact night-coverage at [email or web address], Office  [phone]  password [email or web address] 03/19/2017, 2:38 PM  LOS: 2 days

## 2017-03-21 NOTE — Progress Notes (Signed)
COURTESY NOTE: Discharge plans noted. [name] has an appointment at the [facility] 04/27/ at 9 AM for lab, visit and treatment.  Would SW be able to document that the patient has psychiatric follow-up as outpatient including the name of the person she will be seeing? We have not been able to send information to the patient's counselor--it is not even clear to me that she actually has established herself with psychiatry despite being in this area  now for nearly a year.  I appreciate your help to this complex patient. The good news is that from a breast cancer point of view her disease appears to be optimally controlled.

## 2017-03-21 NOTE — Consult Note (Addendum)
[facility] Consult   Reason for Consult:  Capacity determination Referring Physician:  Dr. Darrick Meigs Patient Identification: [name] MRN:  [id number] Principal Diagnosis: Acute encephalopathy Diagnosis:   Patient Active Problem List   Diagnosis Date Noted  . Bipolar I disorder, most recent episode depressed ([place]) [F31.30] 12/08/2016    Priority: Low  . Acute encephalopathy [G93.40] 03/16/2017  . Acute lower UTI [N39.0]   . Hypokalemia [E87.6] 02/18/2017  . Gait abnormality [R26.9] 01/06/2017  . Adjustment disorder with anxiety [F43.22] 12/06/2016  . Delirium due to another medical condition [F05] 12/03/2016  . Confusion [R41.0] 12/03/2016  . Diarrhea [R19.7] 12/03/2016  . Metastatic breast cancer ([place]) [C50.919]   . Cerumen impaction [H61.20] 11/25/2016  . Otitis media [H66.90] 11/06/2016  . Brain metastasis ([place]) [C79.31] 07/27/2016  . Iron deficiency anemia [D50.9] 06/26/2016  . Bone metastases ([place]) [C79.51] 06/03/2016  . Primary cancer of lower-inner quadrant of left female breast ([place]) [C50.312] 06/01/2016  . Pap smear for cervical cancer screening [Z12.4] 03/28/2015  . Current smoker [F17.200] 03/28/2015  . Healthcare maintenance [Z00.00] 03/28/2015  . Seasonal allergies [J30.2] 03/28/2015  . Anxiety state [F41.1] 02/28/2015  . Fibromyalgia [M79.7] 02/28/2015  . Family history of diabetes mellitus [Z83.3] 02/28/2015  . H/O alcohol abuse [Z87.898]     Total Time spent with patient: 1 hour  Subjective:   [name] is a 56 y.o. female patient admitted with confusion and hallucination  HPI:  Thanks for asking me to do a capacity determination on [name] who is well known to psychiatric service due to multiple ED visits. Patient was seen in her room and her chart was reviewed. Patient still appears confused she is unable to articulate the reason for her admission to the hospital but says it is probably due to hand tremors.She is focused on taking  Lorazepam which she believes helps her hand tremors. Patient is oriented only to person and place but not time. Patient denies delusions, depression or psychosis but still appears anxious. She is unable to articulate how to effectively take her medications without supervision, she gets confused pretty often. She is focused on being discharged but says she lives with a roommate with whom she does not really get along. Patient seems to be responding better to Low dose of Lithium due to decreasing level of confusion.  Past Psychiatric History:  Bipolar depression, Anxiety disorder, Delirium  Risk to Self: Suicidal Ideation: No (DENIES) Suicidal Intent: No Is patient at risk for suicide?: No Suicidal Plan?: No Access to Means: No (UNRESPONSIVE- PREVIOUSLY REPORTED NO ACCESS) What has been your use of drugs/alcohol within the last 12 months?:  (DENIES USE ALTHOUGH HX OF POLYSUBSTANCE DEPENDENCE) How many times?:  (0) Other Self Harm Risks:  (NONE REPORTED) Triggers for Past Attempts: None known Intentional Self Injurious Behavior: None Risk to Others: Homicidal Ideation: No (DENIES) Thoughts of Harm to Others: No (DENIES) Current Homicidal Intent: No Current Homicidal Plan: No Access to Homicidal Means: No Identified Victim:  (NA) History of harm to others?: No (NONE REPORTED) Assessment of Violence: None Noted Violent Behavior Description:  (NA) Does patient have access to weapons?: No Criminal Charges Pending?: No Does patient have a court date: No Prior Inpatient Therapy: Prior Inpatient Therapy: Yes Prior Therapy Dates:  (2017, 2018) Prior Therapy Facilty/Provider(s):  [facility]) Reason for Treatment:  (PER PT RECORD, BIPOLAR D/O) Prior Outpatient Therapy: Prior Outpatient Therapy: Yes Prior Therapy Dates:  (2017 PER PT RECORD) Prior Therapy Facilty/Provider(s):  ([facility]  PER PT RECORD) Reason for Treatment:  (BIPOLAR D/O PER PT RECORD) Does patient have an ACCT team?:  No Does patient have Intensive In-House Services?  : No Does patient have [facility] services? : No Does patient have P4CC services?: No  Past Medical History:  Past Medical History:  Diagnosis Date  . Alcohol abuse   . Anemia    during chemo  . Anxiety    At age 88  . Arthritis Dx 2010  . Bipolar disorder ([place])   . Cancer ([place])    breast mets to brain  . Chronic pain   . Complication of anesthesia   . Depression   . Fibromyalgia Dx 2005  . GERD (gastroesophageal reflux disease)   . Headache    hx  migraines  . Opiate dependence ([place])   . PONV (postoperative nausea and vomiting)   . Port-a-cath in place   . PTSD (post-traumatic stress disorder)     Past Surgical History:  Procedure Laterality Date  . APPLICATION OF CRANIAL NAVIGATION N/A 08/14/2016   Procedure: APPLICATION OF CRANIAL NAVIGATION;  Surgeon: Erline Levine, MD;  Location: [place] [facility]UR[facility] ORS;  Service: Neurosurgery;  Laterality: N/A;  . BREAST RECONSTRUCTION Left    with silicone implant  . CRANIOTOMY N/A 08/14/2016   Procedure: CRANIOTOMY TUMOR EXCISION WITH Lucky Rathke;  Surgeon: Erline Levine, MD;  Location: [place] [facility]UR[facility];  Service: Neurosurgery;  Laterality: N/A;  . FIBULA FRACTURE SURGERY Left   . MASTECTOMY Left   . RADIOLOGY WITH ANESTHESIA N/A 07/23/2016   Procedure: MRI OF BRAIN WITH AND WITHOUT;  Surgeon: Medication Radiologist, MD;  Location: [place];  Service: Radiology;  Laterality: N/A;  . RADIOLOGY WITH ANESTHESIA N/A 09/08/2016   Procedure: MRI OF BRAIN WITH AND WITHOUT CONTRAST;  Surgeon: Medication Radiologist, MD;  Location: [place];  Service: Radiology;  Laterality: N/A;  . RADIOLOGY WITH ANESTHESIA N/A 12/10/2016   Procedure: MRI OF BRAIN WITH AND WITHOUT;  Surgeon: Medication Radiologist, MD;  Location: [place];  Service: Radiology;  Laterality: N/A;  . RADIOLOGY WITH ANESTHESIA N/A 03/02/2017   Procedure: MRI of BRAIN W and W/OUT CONTRAST;  Surgeon: Medication Radiologist, MD;  Location: [place];  Service:  Radiology;  Laterality: N/A;  . right power port placement Right    Family History:  Family History  Problem Relation Age of Onset  . Diabetes Mother   . Bipolar disorder Mother   . CAD Father    Family Psychiatric  History: Noncontributory Social History:  History  Alcohol Use No    Comment: no ETOH since 08/22/12     History  Drug Use No    Comment: prescription opiates from the street daily until Sunday    Social History   Social History  . Marital status: Single    Spouse name: N/A  . Number of children: N/A  . Years of education: N/A   Social History Main Topics  . Smoking status: Current Every Day Smoker    Packs/day: 0.25    Types: Cigarettes  . Smokeless tobacco: Never Used     Comment: Pt is on Chantix at present time  . Alcohol use No     Comment: no ETOH since 08/22/12  . Drug use: No     Comment: prescription opiates from the street daily until Sunday  . Sexual activity: No     Comment: ablation   Other Topics Concern  . None   Social History Narrative  . None   Additional Social History:  Allergies:   Allergies  Allergen Reactions  . Demerol Itching and Nausea And Vomiting  . Erythromycin Rash    Labs:  Results for orders placed or performed during the hospital encounter of 03/16/17 (from the past 48 hour(s))  Glucose, capillary     Status: None   Collection Time: 03/20/17  8:38 AM  Result Value Ref Range   Glucose-Capillary 85 65 - 99 mg/dL  Glucose, capillary     Status: None   Collection Time: 03/21/17  7:49 AM  Result Value Ref Range   Glucose-Capillary 90 65 - 99 mg/dL    Current Facility-Administered Medications  Medication Dose Route Frequency Provider Last Rate Last Dose  . 0.9 %  sodium chloride infusion  250 mL Intravenous PRN Vianne Bulls, MD      . anastrozole (ARIMIDEX) tablet 1 mg  1 mg Oral Daily Vianne Bulls, MD   1 mg at 03/21/17 1007  . enoxaparin (LOVENOX) injection 40 mg  40 mg Subcutaneous Q24H Vianne Bulls, MD   40 mg at 03/21/17 0859  . feeding supplement (ENSURE ENLIVE) (ENSURE ENLIVE) liquid 237 mL  237 mL Oral TID BM Oswald Hillock, MD   237 mL at 03/21/17 1023  . gabapentin (NEURONTIN) capsule 600 mg  600 mg Oral BID Oswald Hillock, MD   600 mg at 03/21/17 0854  . ibuprofen (ADVIL,MOTRIN) tablet 800 mg  800 mg Oral Q6H PRN Vianne Bulls, MD   800 mg at 03/20/17 0901  . lamoTRIgine (LAMICTAL) tablet 150 mg  150 mg Oral Daily Vianne Bulls, MD   150 mg at 03/21/17 1027  . lidocaine-prilocaine (EMLA) cream 1 application  1 application Topical PRN Ilene Qua Opyd, MD      . lithium carbonate (ESKALITH) CR tablet 450 mg  450 mg Oral QHS Ambrose Finland, MD   450 mg at 03/19/17 2327  . loratadine (CLARITIN) tablet 10 mg  10 mg Oral Daily Vianne Bulls, MD   10 mg at 03/21/17 1028  . LORazepam (ATIVAN) tablet 1 mg  1 mg Oral Q4H PRN Oswald Hillock, MD   1 mg at 03/21/17 1006  . magic mouthwash  5 mL Oral QID PRN Vianne Bulls, MD      . pantoprazole (PROTONIX) EC tablet 40 mg  40 mg Oral Daily Vianne Bulls, MD   40 mg at 03/21/17 1008  . potassium chloride SA (K-DUR,KLOR-CON) CR tablet 10 mEq  10 mEq Oral Daily Vianne Bulls, MD   10 mEq at 03/21/17 1209  . QUEtiapine (SEROQUEL) tablet 50 mg  50 mg Oral QHS Ambrose Finland, MD   50 mg at 03/20/17 2133  . sodium chloride flush (NS) 0.9 % injection 10-40 mL  10-40 mL Intracatheter Q12H Oswald Hillock, MD   10 mL at 03/20/17 0536  . sodium chloride flush (NS) 0.9 % injection 3 mL  3 mL Intravenous Q12H Vianne Bulls, MD   3 mL at 03/18/17 2030  . sodium chloride flush (NS) 0.9 % injection 3 mL  3 mL Intravenous PRN Vianne Bulls, MD      . traMADol (ULTRAM) tablet 50 mg  50 mg Oral Q12H PRN Vianne Bulls, MD   50 mg at 03/21/17 0854  . traZODone (DESYREL) tablet 200 mg  200 mg Oral QHS Vianne Bulls, MD   200 mg at 03/20/17 2132    Musculoskeletal: Strength & Muscle Tone: decreased Gait &  Station: unsteady Patient leans:  N/A  Psychiatric Specialty Exam: Physical Exam  Psychiatric: Judgment and thought content normal. Her mood appears anxious. Her speech is slurred. She is slowed. Cognition and memory are impaired.   as per history and physical   ROS generalized weakness, shaking both upper arms and right shoulder and questionable lithium toxicity even though this seems to be therapeutic range. Patient continued to be confused and not able to answer most of the questions.  No Fever-chills, No Headache, No changes with Vision or hearing, reports vertigo No problems swallowing food or Liquids, No Chest pain, Cough or Shortness of Breath, No Abdominal pain, No Nausea or Vommitting, Bowel movements are regular, No Blood in stool or Urine, No dysuria, No new skin rashes or bruises, No new joints pains-aches,  No new weakness, tingling, numbness in any extremity, No recent weight gain or loss, No polyuria, polydypsia or polyphagia,  A full 10 point Review of Systems was done, except as stated above, all other Review of Systems were negative.   Blood pressure 103/66, pulse 81, temperature 97.7 F (36.5 C), temperature source Oral, resp. rate 18, weight 42.6 kg (94 lb), SpO2 98 %.Body mass index is 15.64 kg/m.  General Appearance: Casual  Eye Contact:  Fair  Speech:  Pressured, Slow and Slurred  Volume:  Decreased  Mood:  Anxious  Affect:  Constricted  Thought Process:  Disorganized  Orientation:  Other:  Oriented to person and place only  Thought Content:  Illogical, Rumination and Tangential  Suicidal Thoughts:  No  Homicidal Thoughts:  No  Memory:  Immediate;   Fair Recent;   Poor Remote;   Fair  Judgement:  Impaired  Insight:  Shallow  Psychomotor Activity:  Decreased and Restlessness  Concentration:  Concentration: Fair and Attention Span: Poor  Recall:  [employer] of Knowledge:  Fair  Language:  Good  Akathisia:  Negative  Handed:  Right  AIMS (if indicated):     Assets:  Desire for  Improvement Housing Leisure Time Resilience  ADL's:  Impaired  Cognition:  Impaired,  Moderate  Sleep:        Treatment Plan Summary:  56 years old female with bipolar disorder and Anxiety who was admitted due to confusion, disorientation, hallucination and bilateral hand tremors. Lithium level on admission was 1.20 which is considered therapeutic at the higher end but at that level may be contributing to bilateral hand tremors. Lithium level was reduce to 450 mg. Today, patient denies hallucination but still has fine tremors and moments of confusion. Patient does not have capacity to make medical decision as she does not fully understand the extent of her current mental and medical conditions due to recurrent disorientation and confusions.   Plan/Recommendations: -Continue Low dose Lithium of 450 mg daily for bipolar disorder. -Consider a repeat blood lithium level before discharging patient. -Continue other medications for mental health at current dose. -Consider social worker evaluation for placement in assisted living facility where patient's medications could be adequately monitored. -Refer patient for outpatient psychiatric follow up upon discharge.  Disposition:  Patient does not meet criteria for psychiatric inpatient admission. Supportive therapy provided about ongoing stressors.   Corena Pilgrim, MD 03/21/2017 3:07 PM

## 2017-03-22 ENCOUNTER — Telehealth: Payer: Self-pay | Admitting: Radiation Oncology

## 2017-03-22 ENCOUNTER — Ambulatory Visit
Admission: RE | Admit: 2017-03-22 | Discharge: 2017-03-22 | Disposition: A | Discharge status: Home / Self Care | Payer: Medicaid Other | Source: Ambulatory Visit | Attending: Radiation Oncology | Admitting: Radiation Oncology

## 2017-03-22 MED ORDER — LORAZEPAM 2 MG/ML IJ SOLN
1.0000 mg | INTRAMUSCULAR | Status: DC | PRN
  Filled 2017-03-22: qty 1

## 2017-03-22 NOTE — Telephone Encounter (Signed)
I left a message with the patient's son regarding Ms. Thibodaux's care and asked him to call me back.

## 2017-03-22 NOTE — Progress Notes (Signed)
COURTESY NOTE:  I met briefly with them very at today to remind her of her appointment in our office on Friday. She has that information in writing.  MRA really does not understand what it is that brought her to the hospital and does not remember her earlier confusion. When I asked her we will be following her from a psychiatric point of view as an outpatient she says "I have no idea".  She feels she would benefit from an assisted living situation. I agree that it would be better for her to live in a more structured environment.  I am hopeful between psychiatry and social work they will be able to make sure the patient has an appropriate referral a date and time for psychiatric appointment after discharge. Otherwise I would think she likely will be back in the hospital within the next 2-3 weeks  Appreciate your help to this complex patient examination point

## 2017-03-22 NOTE — Progress Notes (Signed)
Physical Therapy Treatment Patient Details Name: [name] MRN: [id number] DOB: May 12, 1961 Today's Date: 03/22/2017    History of Present Illness Pt is a 56 y/o female presented to the ED with confusion and hallucinations. Of note, pt recently d/[facility]'d [facility] on [facility] for similar issues. 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    PT Comments    Cognitive deficits persist;  Decr short-term memory, decr awareness of safety and noting impulsive; Session focused on gait and RW use; [name] did acknowledge that th RW makes walking easier  Follow Up Recommendations  SNF;Supervision/Assistance - 24 hour     Equipment Recommendations  None recommended by PT    Recommendations for Other Services       Precautions / Restrictions Precautions Precautions: Fall    Mobility  Bed Mobility Overal bed mobility: Needs Assistance Bed Mobility: Supine to Sit     Supine to sit: Supervision     General bed mobility comments: Supervision fro safety  Transfers Overall transfer level: Needs assistance Equipment used: None Transfers: Sit to/from Stand Sit to Stand: Min guard         General transfer comment: Minguard for safety  Ambulation/Gait Ambulation/Gait assistance: Min assist   Assistive device: Rolling walker (2 wheeled) Gait Pattern/deviations: Step-through pattern;Decreased step length - right;Decreased step length - left;Decreased stride length;Drifts right/left     General Gait Details: pt very unsafe with ambulation requiring constant min A for safety with RW and for navigating safely in hallway. Cues for RW proximity and feet positioning in RW; adjusted height of RW for better fit   Stairs            Wheelchair Mobility    Modified Rankin (Stroke Patients Only)       Balance     Sitting balance-Leahy Scale: Fair       Standing balance-Leahy Scale: Poor (approaching  Fair) Standing balance comment: close min guard for stability; L lean with fatigue                            Cognition Arousal/Alertness: Awake/alert Behavior During Therapy: Impulsive (noting emotional lability) Overall Cognitive Status: No family/caregiver present to determine baseline cognitive functioning Area of Impairment: Attention;Memory;Following commands;Safety/judgement;Awareness;Problem solving                   Current Attention Level: Focused Memory: Decreased short-term memory Following Commands: Follows one step commands with increased time (and reminders) Safety/Judgement: Decreased awareness of safety;Decreased awareness of deficits Awareness: Intellectual Problem Solving: Slow processing;Decreased initiation;Difficulty sequencing;Requires verbal cues;Requires tactile cues        Exercises      General Comments        Pertinent Vitals/Pain Pain Assessment: Faces Faces Pain Scale: Hurts little more Pain Location: Headache and back hurting Pain Descriptors / Indicators: Aching;Headache Pain Intervention(s): Patient requesting pain meds-RN notified    Home Living                      Prior Function            PT Goals (current goals can now be found in the care plan section) Acute Rehab PT Goals Patient Stated Goal: to get out of the hospital PT Goal Formulation: Patient unable to participate in goal setting Time For Goal Achievement: 04/03/17 Potential to Achieve Goals: Fair Progress towards PT goals: Progressing  toward goals    Frequency    Min 3X/week      PT Plan Current plan remains appropriate    Co-evaluation PT/OT/SLP Co-Evaluation/Treatment: Yes (for latter part of session) Reason for Co-Treatment: Necessary to address cognition/behavior during functional activity PT goals addressed during session: Mobility/safety with mobility       End of Session Equipment Utilized During Treatment: Gait  belt Activity Tolerance: Patient tolerated treatment well Patient left: Other (comment) (Navigating the room with OT) Nurse Communication: Mobility status PT Visit Diagnosis: Unsteadiness on feet (R26.81);Other abnormalities of gait and mobility (R26.89);Muscle weakness (generalized) (M62.81)     Time: 0828-0900 PT Time Calculation (min) (ACUTE ONLY): 32 min  Charges:  $Gait Training: 8-22 mins                    G Codes:       Roney Marion, [place]  Acute Rehabilitation Services Pager [phone] Office [place] 03/22/2017, 10:08 AM

## 2017-03-22 NOTE — Evaluation (Signed)
Occupational Therapy Evaluation Patient Details Name: [name] MRN: [id number] DOB: 1961/11/15 Today's Date: 03/22/2017    History of Present Illness Pt is a 56 y/o female presented to the ED with confusion and hallucinations. Of note, pt recently d/c'd from [facility] on 03/01/17 for similar issues. 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   Clinical Impression   PTA, pt was living with three roommates and reports to be independent in ADLs. Also, reports several falls at home. Currently, pt required Min guard-Min A throughout session due to unsteadiness in standing, high impulsivity, and poor safety and self awareness. Pt would benefit from continued skilled OT to increase safety and independence with ADLs and functional mobility. Recommend dc to SNF for further OT to facilitate safe dc and increase her overall safety during ADLs.     Follow Up Recommendations  SNF;Supervision/Assistance - 24 hour    Equipment Recommendations       Recommendations for Other Services       Precautions / Restrictions Precautions Precautions: Fall Restrictions Weight Bearing Restrictions: No      Mobility Bed Mobility               General bed mobility comments: In bathroom with PT upon arrival  Transfers Overall transfer level: Needs assistance Equipment used: None Transfers: Sit to/from Stand Sit to Stand: Min guard         General transfer comment: Min guard for safety due to unsteady balance    Balance Overall balance assessment: Needs assistance Sitting-balance support: No upper extremity supported;Feet supported Sitting balance-Leahy Scale: Fair     Standing balance support: No upper extremity supported;During functional activity Standing balance-Leahy Scale: Poor Standing balance comment: close min guard for stability; L lean with fatigue                           ADL either performed or assessed  with clinical judgement   ADL Overall ADL's : Needs assistance/impaired Eating/Feeding: Supervision/ safety;Set up;Sitting   Grooming: Minimal assistance;Standing;Wash/dry hands Grooming Details (indicate cue type and reason): When performing bilateral coorination tasks, pt requires Min A for balance. Pt had a lose of balance in standing Upper Body Bathing: Set up;Supervision/ safety;Sitting   Lower Body Bathing: Minimal assistance;Sit to/from stand   Upper Body Dressing : Set up;Supervision/safety;Sitting   Lower Body Dressing: Minimal assistance;Sit to/from stand   Toilet Transfer: Minimal assistance;Ambulation   Toileting- Clothing Manipulation and Hygiene: Min guard;Sit to/from stand       Functional mobility during ADLs: Min guard;Minimal assistance (Unsteady gait throughout session) General ADL Comments: Pt demonstrates poor safety awarness and increased impulsivity     Vision Baseline Vision/History: Wears glasses Wears Glasses: At all times Vision Assessment?: Vision impaired- to be further tested in functional context     Perception     Praxis      Pertinent Vitals/Pain Pain Assessment: Faces Faces Pain Scale: Hurts little more Pain Location: Headache and back hurting Pain Descriptors / Indicators: Aching;Headache Pain Intervention(s): Monitored during session     Hand Dominance Right   Extremity/Trunk Assessment Upper Extremity Assessment Upper Extremity Assessment: Generalized weakness   Lower Extremity Assessment Lower Extremity Assessment: Generalized weakness   Cervical / Trunk Assessment Cervical / Trunk Assessment: Normal   Communication Communication Communication: Other (comment) (Pt has difficulty answering questions due to confusion)   Cognition Arousal/Alertness: Awake/alert Behavior During Therapy: Impulsive Overall Cognitive  Status: No family/caregiver present to determine baseline cognitive functioning Area of Impairment:  Attention;Memory;Following commands;Safety/judgement;Awareness;Problem solving                 Orientation Level: Disoriented to;Situation Current Attention Level: Focused Memory: Decreased short-term memory Following Commands: Follows one step commands with increased time Safety/Judgement: Decreased awareness of safety;Decreased awareness of deficits Awareness: Intellectual Problem Solving: Slow processing;Decreased initiation;Difficulty sequencing;Requires verbal cues;Requires tactile cues General Comments: Pt would say words or sentences that did not relate to current situation   General Comments  Pt highly impulsivity and has decreased safety awarness and attention    Exercises     Shoulder Instructions      Home Living Family/patient expects to be discharged to:: Private residence Living Arrangements: Non-relatives/Friends Available Help at Discharge: Friend(s);Available PRN/intermittently Type of Home: House       Home Layout: One level         Bathroom Toilet: Standard     Home Equipment: Walker - 2 wheels;Cane - single point          Prior Functioning/Environment Level of Independence: Independent        Comments: Pt reported that she was independent; however, pt was very confused throughout and questionable how reliable this information is        OT Problem List: Decreased strength;Decreased activity tolerance;Impaired balance (sitting and/or standing);Decreased safety awareness;Decreased knowledge of use of DME or AE;Decreased knowledge of precautions      OT Treatment/Interventions: Self-care/ADL training;Therapeutic exercise;Energy conservation;DME and/or AE instruction;Therapeutic activities;Patient/family education;Balance training    OT Goals(Current goals can be found in the care plan section) Acute Rehab OT Goals Patient Stated Goal: to get out of the hospital OT Goal Formulation: With patient Time For Goal Achievement:  04/05/17 Potential to Achieve Goals: Good ADL Goals Pt Will Perform Grooming: with modified independence;standing Pt Will Perform Upper Body Dressing: with modified independence;sitting Pt Will Perform Lower Body Dressing: with modified independence;sit to/from stand Pt Will Transfer to Toilet: with modified independence;ambulating;regular height toilet  OT Frequency: Min 2X/week   Barriers to D/C:            Co-evaluation PT/OT/SLP Co-Evaluation/Treatment: Yes Reason for Co-Treatment: Necessary to address cognition/behavior during functional activity;For patient/therapist safety PT goals addressed during session: Mobility/safety with mobility OT goals addressed during session: ADL's and self-care      End of Session Equipment Utilized During Treatment: Gait belt Nurse Communication: Mobility status  Activity Tolerance: Patient tolerated treatment well Patient left: in chair;with call bell/phone within reach;with chair alarm set;with nursing/sitter in room  OT Visit Diagnosis: Muscle weakness (generalized) (M62.81);Other abnormalities of gait and mobility (R26.89)                Time: 1694-5038 OT Time Calculation (min): 17 min Charges:  OT General Charges $OT Visit: 1 Procedure OT Evaluation $OT Eval Moderate Complexity: 1 Procedure G-Codes:     Mimie Goering, OTR/L [place] 03/22/2017, 1:11 PM

## 2017-03-22 NOTE — Progress Notes (Signed)
[facility]  PROGRESS NOTE  [name] ZOX:[id number] DOB: 19-Mar-1961 DOA: 03/16/2017 PCP: Arnoldo Morale, MD   Brief HPI:    56 year old female with history of alcohol abuse, bipolar disorder, left breast cancer with brain metastasis status post mastectomy and craniectomy with radiation to brain and breast came to ED with confusion and hallucinations. Patient started on IV Rocephin for acute cystitis.Patient has been seen by psychiatry and deemed not having decision-making capacity for her medical and psychiatric condition.   Subjective   Patient seen and examined, continues to have intermittent episodes of confusion.   Assessment/Plan:     1. Acute encephalopathy- improving, patient has  intermittent episodes of confusion and hallucinations this has been going on since patient had suboccipital craniectomy for resection of posterior fossa metastasis September 2017 requiring multiple hospitalizations. Patient was  started on Rocephin for possible UTI, urine culture  grew multiple species. MRI brain earlier this month was normal CT head was negative during this admission. Rocephin was discontinued. Patient was seen by psychiatry and deemed to have no decision-making capacity. Still having intermittent episodes of confusion. 2. Abnormal UA-  patient had abnormal UA, started on IV Rocephin empirically . Urine culture grew multiple species. Rocephin was discontinued. No new changes. 3. Bipolar disorder- patient was seen by psychiatry and dose of lithium changed to  450 mg by mouth daily at bedtime. Psych has seen the patient and deemed not having decision-making capacity. Recheck lithium level is 0.37, which is lower than the therapeutic range of 0.60-  1.20 4. Tremors-resolved, likely from lithium, dose of lithium changed to 450 mg once a day.  5. History of breast cancer with brain metastasis- patient is status post mastectomy and axillary node dissection, suboccipital craniectomy for  resection of posterior fossa metastasis, radiation to brain and breast. Continue Arimidex.    DVT prophylaxis: Lovenox  Code Status: Full code  Family Communication: Discussed with patient's father and sister at bedside  Disposition Plan: Pending improvement in mental status  Antibiotics:   Anti-infectives    Start     Dose/Rate Route Frequency Ordered Stop   03/17/17 2000  cefTRIAXone (ROCEPHIN) 1 g in dextrose 5 % 50 mL IVPB  Status:  Discontinued     1 g 100 mL/hr over 30 Minutes Intravenous Every 24 hours 03/16/17 2043 03/18/17 1559   03/16/17 2015  cefTRIAXone (ROCEPHIN) 1 g in dextrose 5 % 50 mL IVPB     1 g 100 mL/hr over 30 Minutes Intravenous  Once 03/16/17 2000 03/16/17 2041       Objective   Vitals:   03/18/17 1031 03/18/17 1711 03/19/17 0433 03/19/17 0809  BP: (!) 106/41 116/65 (!) 73/39 (!) 104/57  Pulse: 96 87 79 89  Resp: 18 19 18 18   Temp:  97.9 F (36.6 C) 98.3 F (36.8 C) 98.7 F (37.1 C)  TempSrc:  Oral Oral Oral  SpO2: 100% 100% 97% 95%  Weight:   40.8 kg (90 lb)     Intake/Output Summary (Last 24 hours) at 03/19/17 1438 Last data filed at 03/19/17 0935  Gross per 24 hour  Intake              713 ml  Output              801 ml  Net              -88 ml   Filed Weights   03/19/17 0433  Weight: 40.8 kg (90 lb)  Physical Examination:  Physical Exam: Eyes: No icterus, extraocular muscles intact  Mouth: Oral mucosa is moist, no lesions on palate,  Neck: Supple, no deformities, masses, or tenderness Lungs: Normal respiratory effort, bilateral clear to auscultation, no crackles or wheezes.  Heart: Regular rate and rhythm, S1 and S2 normal, no murmurs, rubs auscultated Abdomen: BS normoactive,soft,nondistended,non-tender to palpation,no organomegaly Extremities: No pretibial edema, no erythema, no cyanosis, no clubbing Neuro : Alert and oriented to time, place and person, No focal deficits. Patient lacks judgment and insight of her  condtion, lacks decision-making capacity Skin: No rashes seen on exam     Data Reviewed: I have personally reviewed following labs and imaging studies  CBG:  Recent Labs Lab 03/17/17 0759 03/18/17 1027 03/19/17 0815  GLUCAP 97 128* 105*    CBC:  Recent Labs Lab 03/16/17 1630 03/16/17 2134  WBC 4.5  --   NEUTROABS 3.1  --   HGB 11.5*  --   HCT 36.3 34.3  MCV 96.8  --   PLT 180  --     Basic Metabolic Panel:  Recent Labs Lab 03/16/17 1630 03/17/17 0501  NA 139 141  K 3.9 3.8  CL 105 107  CO2 24 26  GLUCOSE 96 94  BUN 5* 6  CREATININE 0.79 0.71  CALCIUM 9.8 9.4    Recent Results (from the past 240 hour(s))  Culture, Urine     Status: Abnormal   Collection Time: 03/16/17  7:15 PM  Result Value Ref Range Status   Specimen Description URINE, RANDOM  Final   Special Requests NONE  Final   Culture MULTIPLE SPECIES PRESENT, SUGGEST RECOLLECTION (A)  Final   Report Status 03/18/2017 FINAL  Final     Liver Function Tests:  Recent Labs Lab 03/16/17 1630  AST 15  ALT 11*  ALKPHOS 34*  BILITOT 0.5  PROT 5.5*  ALBUMIN 3.8   No results for input(s): LIPASE, AMYLASE in the last 168 hours.  Recent Labs Lab 03/16/17 2100  AMMONIA 16    Studies: Ct Chest W Contrast  Result Date: 03/18/2017 CLINICAL DATA:  Breast cancer.  Short breath.  Brain metastasis. EXAM: CT CHEST WITH CONTRAST TECHNIQUE: Multidetector CT imaging of the chest was performed during intravenous contrast administration. CONTRAST:  72mL ISOVUE-300 IOPAMIDOL (ISOVUE-300) INJECTION 61% COMPARISON:  CT 11/10/2016 FINDINGS: Cardiovascular: Port in RIGHT chest wall with tip the distal SVC. No significant vascular findings. Normal heart size. No pericardial effusion. Mediastinum/Nodes: No axillary supraclavicular adenopathy. No mediastinal hilar adenopathy Lungs/Pleura: Mild atelectasis lung bases. Mild linear scarring LEFT lung apex. No suspicious nodularity. Pulmonary edema or pulmonary  infection. Upper Abdomen: Limited view of the liver, kidneys, pancreas are unremarkable. Normal adrenal glands. Multiple small hepatic hypodensities likely represent benign cysts for not comparison Musculoskeletal: Scatter sclerotic lesions in the spine for stable. Post LEFT mastectomy with breast prosthetic. IMPRESSION: 1. No evidence of breast cancer progression. 2. No acute pulmonary parenchymal findings. 3. Stable sclerotic lesions in the thoracic spine. Electronically Signed   By: Suzy Bouchard M.D.   On: 03/18/2017 12:14    Scheduled Meds: . anastrozole  1 mg Oral Daily  . enoxaparin (LOVENOX) injection  40 mg Subcutaneous Q24H  . gabapentin  600 mg Oral BID  . lamoTRIgine  150 mg Oral Daily  . lithium carbonate  450 mg Oral QHS  . loratadine  10 mg Oral Daily  . pantoprazole  40 mg Oral Daily  . potassium chloride  10 mEq Oral Daily  .  QUEtiapine  50 mg Oral QHS  . sodium chloride flush  10-40 mL Intracatheter Q12H  . sodium chloride flush  3 mL Intravenous Q12H  . traZODone  200 mg Oral QHS      Time spent: 25 min  [facility] Pager [phone]. If 7PM-7AM, please contact night-coverage at [email or web address], Office  [phone]  password [email or web address] 03/19/2017, 2:38 PM  LOS: 2 days

## 2017-03-22 NOTE — Clinical Social Work Note (Signed)
CSW received consult regarding discharge planning for patient. Talked with patient's son [name] by phone and consulted with assistant social work Mudlogger, Shawna Orleans regarding appropriate discharge disposition. CSW will initiate SNF facility search for patient and will keep son updated on progress in locating an appropriate facility for patient.  Namir Neto Givens, MSW, LCSW Licensed Clinical Social Worker [place] [phone]

## 2017-03-22 NOTE — Plan of Care (Signed)
Problem: Pain Managment: Goal: General experience of comfort will improve Outcome: Progressing Patient resting comfortably at this time, sitter at bedside

## 2017-03-23 DIAGNOSIS — G9340 Encephalopathy, unspecified: Secondary | ICD-10-CM

## 2017-03-23 DIAGNOSIS — C50919 Malignant neoplasm of unspecified site of unspecified female breast: Secondary | ICD-10-CM

## 2017-03-23 DIAGNOSIS — Z79899 Other long term (current) drug therapy: Secondary | ICD-10-CM

## 2017-03-23 DIAGNOSIS — Z818 Family history of other mental and behavioral disorders: Secondary | ICD-10-CM

## 2017-03-23 DIAGNOSIS — F17210 Nicotine dependence, cigarettes, uncomplicated: Secondary | ICD-10-CM

## 2017-03-23 DIAGNOSIS — F3130 Bipolar disorder, current episode depressed, mild or moderate severity, unspecified: Secondary | ICD-10-CM

## 2017-03-23 LAB — CREATININE, SERUM
Creatinine, Ser: 0.83 mg/dL (ref 0.44–1.00)
GFR calc Af Amer: >60 mL/min (ref >60)
GFR calc non Af Amer: >60 mL/min (ref >60)

## 2017-03-23 LAB — GLUCOSE, CAPILLARY: Glucose-Capillary: 94 mg/dL (ref 65–99)

## 2017-03-23 MED ORDER — OXCARBAZEPINE 300 MG PO TABS
300.0000 mg | ORAL_TABLET | Freq: Two times a day (BID) | ORAL | Status: DC
  Administered 2017-03-23 – 2017-03-26 (×7): 300 mg via ORAL
  Filled 2017-03-23 (×8): qty 1

## 2017-03-23 MED ORDER — LAMOTRIGINE 100 MG PO TABS
200.0000 mg | ORAL_TABLET | Freq: Every day | ORAL | Status: DC
  Administered 2017-03-24 – 2017-04-07 (×14): 200 mg via ORAL
  Filled 2017-03-23 (×12): qty 2
  Filled 2017-03-23: qty 1
  Filled 2017-03-23 (×2): qty 2

## 2017-03-23 NOTE — Progress Notes (Addendum)
[facility]  PROGRESS NOTE  [name] DDU:[id number] DOB: December 08, 1960 DOA: 03/16/2017 PCP: Arnoldo Morale, MD   Brief HPI:    56 year old female with history of alcohol abuse, bipolar disorder, left breast cancer with brain metastasis status post mastectomy and craniectomy with radiation to brain and breast came to ED with confusion and hallucinations. Patient started on IV Rocephin for acute cystitis.Patient has been seen by psychiatry and deemed not having decision-making capacity for her medical and psychiatric condition.   Subjective   Patient seen and examined, she continues to have intermittent episodes of confusion. PT recommends patient to go to skilled nursing facility   Assessment/Plan:     1. Acute encephalopathy- , patient has  intermittent episodes of confusion and hallucinations this has been going on since patient had suboccipital craniectomy for resection of posterior fossa metastasis September 2017 requiring multiple hospitalizations. Patient was  started on Rocephin for possible UTI, urine culture  grew multiple species. MRI brain earlier this month was normal CT head was negative during this admission. Rocephin was discontinued. Patient was seen by psychiatry and deemed to have no decision-making capacity. Still having intermittent episodes of confusion.Safety sitter at bedside 2. Abnormal UA-  patient had abnormal UA, started on IV Rocephin empirically . Urine culture grew multiple species. Rocephin was discontinued.No new changes. 3. Bipolar disorder- patient was seen by psychiatry and dose of lithium changed to  450 mg by mouth daily at bedtime. Psych has seen the patient and deemed not having decision-making capacity. Recheck lithium level is 0.37, which is lower than the therapeutic range of 0.60-  1.20. Discussed with psychiatry, they will adjust the dose of lithium or consider stopping  it before discharge. 4. Tremors-resolved, likely from lithium, dose of  lithium changed to 450 mg once a day as above. 5. History of breast cancer with brain metastasis- patient is status post mastectomy and axillary node dissection, suboccipital craniectomy for resection of posterior fossa metastasis, radiation to brain and breast. Continue Arimidex. Patient was seen by Dr. [name] in the hospital, she has an appointment to see oncology as outpatient.    DVT prophylaxis: Lovenox  Code Status: Full code  Family Communication: No family present at bedside  Disposition Plan: Skilled nursing facility  Antibiotics:   Anti-infectives    Start     Dose/Rate Route Frequency Ordered Stop   03/17/17 2000  cefTRIAXone (ROCEPHIN) 1 g in dextrose 5 % 50 mL IVPB  Status:  Discontinued     1 g 100 mL/hr over 30 Minutes Intravenous Every 24 hours 03/16/17 2043 03/18/17 1559   03/16/17 2015  cefTRIAXone (ROCEPHIN) 1 g in dextrose 5 % 50 mL IVPB     1 g 100 mL/hr over 30 Minutes Intravenous  Once 03/16/17 2000 03/16/17 2041       Objective   Vitals:   03/18/17 1031 03/18/17 1711 03/19/17 0433 03/19/17 0809  BP: (!) 106/41 116/65 (!) 73/39 (!) 104/57  Pulse: 96 87 79 89  Resp: 18 19 18 18   Temp:  97.9 F (36.6 C) 98.3 F (36.8 C) 98.7 F (37.1 C)  TempSrc:  Oral Oral Oral  SpO2: 100% 100% 97% 95%  Weight:   40.8 kg (90 lb)     Intake/Output Summary (Last 24 hours) at 03/19/17 1438 Last data filed at 03/19/17 0935  Gross per 24 hour  Intake              713 ml  Output  801 ml  Net              -88 ml   Filed Weights   03/19/17 0433  Weight: 40.8 kg (90 lb)     Physical Examination:  Physical Exam: Eyes: No icterus, extraocular muscles intact  Mouth: Oral mucosa is moist, no lesions on palate,  Neck: Supple, no deformities, masses, or tenderness Lungs: Normal respiratory effort, bilateral clear to auscultation, no crackles or wheezes.  Heart: Regular rate and rhythm, S1 and S2 normal, no murmurs, rubs auscultated Abdomen: BS  normoactive,soft,nondistended,non-tender to palpation,no organomegaly Extremities: No pretibial edema, no erythema, no cyanosis, no clubbing Neuro : Alert and oriented to time, place and person, No focal deficits     Data Reviewed: I have personally reviewed following labs and imaging studies  CBG:  Recent Labs Lab 03/17/17 0759 03/18/17 1027 03/19/17 0815  GLUCAP 97 128* 105*    CBC:  Recent Labs Lab 03/16/17 1630 03/16/17 2134  WBC 4.5  --   NEUTROABS 3.1  --   HGB 11.5*  --   HCT 36.3 34.3  MCV 96.8  --   PLT 180  --     Basic Metabolic Panel:  Recent Labs Lab 03/16/17 1630 03/17/17 0501  NA 139 141  K 3.9 3.8  CL 105 107  CO2 24 26  GLUCOSE 96 94  BUN 5* 6  CREATININE 0.79 0.71  CALCIUM 9.8 9.4    Recent Results (from the past 240 hour(s))  Culture, Urine     Status: Abnormal   Collection Time: 03/16/17  7:15 PM  Result Value Ref Range Status   Specimen Description URINE, RANDOM  Final   Special Requests NONE  Final   Culture MULTIPLE SPECIES PRESENT, SUGGEST RECOLLECTION (A)  Final   Report Status 03/18/2017 FINAL  Final     Liver Function Tests:  Recent Labs Lab 03/16/17 1630  AST 15  ALT 11*  ALKPHOS 34*  BILITOT 0.5  PROT 5.5*  ALBUMIN 3.8   No results for input(s): LIPASE, AMYLASE in the last 168 hours.  Recent Labs Lab 03/16/17 2100  AMMONIA 16    Studies: Ct Chest W Contrast  Result Date: 03/18/2017 CLINICAL DATA:  Breast cancer.  Short breath.  Brain metastasis. EXAM: CT CHEST WITH CONTRAST TECHNIQUE: Multidetector CT imaging of the chest was performed during intravenous contrast administration. CONTRAST:  39mL ISOVUE-300 IOPAMIDOL (ISOVUE-300) INJECTION 61% COMPARISON:  CT 11/10/2016 FINDINGS: Cardiovascular: Port in RIGHT chest wall with tip the distal SVC. No significant vascular findings. Normal heart size. No pericardial effusion. Mediastinum/Nodes: No axillary supraclavicular adenopathy. No mediastinal hilar  adenopathy Lungs/Pleura: Mild atelectasis lung bases. Mild linear scarring LEFT lung apex. No suspicious nodularity. Pulmonary edema or pulmonary infection. Upper Abdomen: Limited view of the liver, kidneys, pancreas are unremarkable. Normal adrenal glands. Multiple small hepatic hypodensities likely represent benign cysts for not comparison Musculoskeletal: Scatter sclerotic lesions in the spine for stable. Post LEFT mastectomy with breast prosthetic. IMPRESSION: 1. No evidence of breast cancer progression. 2. No acute pulmonary parenchymal findings. 3. Stable sclerotic lesions in the thoracic spine. Electronically Signed   By: Suzy Bouchard M.D.   On: 03/18/2017 12:14    Scheduled Meds: . anastrozole  1 mg Oral Daily  . enoxaparin (LOVENOX) injection  40 mg Subcutaneous Q24H  . gabapentin  600 mg Oral BID  . lamoTRIgine  150 mg Oral Daily  . lithium carbonate  450 mg Oral QHS  . loratadine  10 mg Oral  Daily  . pantoprazole  40 mg Oral Daily  . potassium chloride  10 mEq Oral Daily  . QUEtiapine  50 mg Oral QHS  . sodium chloride flush  10-40 mL Intracatheter Q12H  . sodium chloride flush  3 mL Intravenous Q12H  . traZODone  200 mg Oral QHS      Time spent: 25 min  [facility] Pager [phone]. If 7PM-7AM, please contact night-coverage at [email or web address], Office  [phone]  password [email or web address] 03/19/2017, 2:38 PM  LOS: 2 days

## 2017-03-23 NOTE — Clinical Social Work Note (Signed)
CSW talked with patient's son, [name] [phone]) regarding placement efforts for his mother today. Also continuing to consult with SW Assistant Director Nathaniel Man regarding patient. CSW advised that son will need to apply for Medicaid and SSA disability for patient. Son contacted and advised and provided with social services phone number for him to call to determine if he can apply for Medicaid online. CSW also requested that [name] go to [employer] website to determine if he can apply for disability for his mother online also. Son willing to do what he can to help, however his concern is having the information he will need for the applications. CSW will continue to follow and assist son as much as possible with these applications.  Merton Wadlow Givens, MSW, LCSW Licensed Clinical Social Worker [place] [phone]

## 2017-03-23 NOTE — NC FL2 (Signed)
[place] LEVEL OF CARE SCREENING TOOL     IDENTIFICATION  Patient Name: [name] Birthdate: [birth year] Sex: female Admission Date (Current Location): 03/16/2017  [facility] and [place] Number:  Herbalist and Address:  [facility], [address], [place], [place] [address]      Provider Number: [id number]  Attending Physician Name and Address:  Oswald Hillock, MD  Relative Name and Phone Number:  Dalilah Curlin [facility]) [phone]    Current Level of Care: Hospital Recommended Level of Care: [place] Prior Approval Number:    Date Approved/Denied:   PASRR Number: Submitted on 03/23/2017 - NCMUST ID #[id number]  Discharge Plan: SNF    Current Diagnoses: Patient Active Problem List   Diagnosis Date Noted  . Acute encephalopathy 03/16/2017  . Acute lower UTI   . Hypokalemia 02/18/2017  . Gait abnormality 01/06/2017  . Bipolar I disorder, most recent episode depressed ([place]) 12/08/2016  . Adjustment disorder with anxiety 12/06/2016  . Delirium due to another medical condition 12/03/2016  . Confusion 12/03/2016  . Diarrhea 12/03/2016  . Metastatic breast cancer ([place])   . Cerumen impaction 11/25/2016  . Otitis media 11/06/2016  . Brain metastasis ([place]) 07/27/2016  . Iron deficiency anemia 06/26/2016  . Bone metastases ([place]) 06/03/2016  . Primary cancer of lower-inner quadrant of left female breast ([place]) 06/01/2016  . Pap smear for cervical cancer screening 03/28/2015  . Current smoker 03/28/2015  . Healthcare maintenance 03/28/2015  . Seasonal allergies 03/28/2015  . Anxiety state 02/28/2015  . Fibromyalgia 02/28/2015  . Family history of diabetes mellitus 02/28/2015  . H/O alcohol abuse     Orientation RESPIRATION BLADDER Height & Weight     Self  Normal Continent Weight: 48.5 kg (107 lb) Height:  5\' 5"  (165.1 cm)  BEHAVIORAL SYMPTOMS/MOOD NEUROLOGICAL BOWEL NUTRITION STATUS      Continent Diet (Regular  Diet)  AMBULATORY STATUS COMMUNICATION OF NEEDS Skin   Limited Assist Verbally Other (Comment) (Dry; Ecchymosis left eye)                       Personal Care Assistance Level of Assistance  Bathing, Feeding, Dressing Bathing Assistance: Limited assistance (Upper Body:  Setup; Lower Body:  Minimal Assist) Feeding assistance: Limited assistance (Need Setup) Dressing Assistance: Limited assistance (Upper Body:  Setup; Lower Body:  Minimal Assist)     Functional Limitations Info  Sight, Hearing, Speech Sight Info: Adequate Hearing Info: Adequate Speech Info: Impaired (Slow, delayed, consfused responses)    SPECIAL CARE FACTORS FREQUENCY  OT (By licensed OT), PT (By licensed PT)     PT Frequency: PT Eval completed on 03/20/2017 with recommendations of 3x/week minimum. OT Frequency: OT Eval completed on 03/22/2017 with recommendations of 2/week minimum.            Contractures Contractures Info: Present    Additional Factors Info  Psychotropic, Code Status, Allergies Code Status Info: Full Code Allergies Info: Demerol, Erythromycin Psychotropic Info: Lithium [place]; Ativan; Seroquel; Trazodone         Current Medications (03/23/2017):  This is the current hospital active medication list Current Facility-Administered Medications  Medication Dose Route Frequency Provider Last Rate Last Dose  . 0.9 %  sodium chloride infusion  250 mL Intravenous PRN Vianne Bulls, MD      . anastrozole (ARIMIDEX) tablet 1 mg  1 mg Oral Daily Vianne Bulls, MD   1 mg at 03/23/17 1031  .  enoxaparin (LOVENOX) injection 40 mg  40 mg Subcutaneous Q24H Vianne Bulls, MD   40 mg at 03/23/17 1028  . feeding supplement (ENSURE ENLIVE) (ENSURE ENLIVE) liquid 237 mL  237 mL Oral TID BM Oswald Hillock, MD   237 mL at 03/23/17 1037  . gabapentin (NEURONTIN) capsule 600 mg  600 mg Oral BID Oswald Hillock, MD   600 mg at 03/23/17 1028  . ibuprofen (ADVIL,MOTRIN) tablet 800 mg  800 mg Oral Q6H PRN Vianne Bulls, MD   800 mg at 03/22/17 0907  . lamoTRIgine (LAMICTAL) tablet 150 mg  150 mg Oral Daily Vianne Bulls, MD   150 mg at 03/23/17 1032  . lidocaine-prilocaine (EMLA) cream 1 application  1 application Topical PRN Ilene Qua Opyd, MD      . lithium carbonate (ESKALITH) CR tablet 450 mg  450 mg Oral QHS Ambrose Finland, MD   450 mg at 03/22/17 2049  . loratadine (CLARITIN) tablet 10 mg  10 mg Oral Daily Vianne Bulls, MD   10 mg at 03/23/17 1029  . LORazepam (ATIVAN) injection 1 mg  1 mg Intravenous Q4H PRN Oswald Hillock, MD      . magic mouthwash  5 mL Oral QID PRN Vianne Bulls, MD      . pantoprazole (PROTONIX) EC tablet 40 mg  40 mg Oral Daily Vianne Bulls, MD   40 mg at 03/23/17 1031  . potassium chloride SA (K-DUR,KLOR-CON) CR tablet 10 mEq  10 mEq Oral Daily Vianne Bulls, MD   10 mEq at 03/23/17 1030  . QUEtiapine (SEROQUEL) tablet 50 mg  50 mg Oral QHS Ambrose Finland, MD   50 mg at 03/22/17 2049  . sodium chloride flush (NS) 0.9 % injection 10-40 mL  10-40 mL Intracatheter Q12H Oswald Hillock, MD   10 mL at 03/22/17 2050  . sodium chloride flush (NS) 0.9 % injection 3 mL  3 mL Intravenous Q12H Vianne Bulls, MD   3 mL at 03/22/17 2050  . sodium chloride flush (NS) 0.9 % injection 3 mL  3 mL Intravenous PRN Vianne Bulls, MD      . traMADol (ULTRAM) tablet 50 mg  50 mg Oral Q12H PRN Vianne Bulls, MD   50 mg at 03/22/17 0901  . traZODone (DESYREL) tablet 200 mg  200 mg Oral QHS Vianne Bulls, MD   200 mg at 03/22/17 2049     Discharge Medications: Please see discharge summary for a list of discharge medications.  Relevant Imaging Results:  Relevant Lab Results:   Additional Information SSN:  [id number]  Lajoyce Lauber Work [phone]

## 2017-03-23 NOTE — Consult Note (Signed)
[facility]iat[facility] Consult   Reason for Consult:  Medication management for bipolar disorder Referring Physician:  Dr. Darrick Meigs Patient Identification: [name] MRN:  [id number] Principal Diagnosis: Acute encephalopathy Diagnosis:   Patient Active Problem List   Diagnosis Date Noted  . Acute encephalopathy [G93.40] 03/16/2017  . Acute lower UTI [N39.0]   . Hypokalemia [E87.6] 02/18/2017  . Gait abnormality [R26.9] 01/06/2017  . Bipolar I disorder, most recent episode depressed ([place]) [F31.30] 12/08/2016  . Adjustment disorder with anxiety [F43.22] 12/06/2016  . Delirium due to another medical condition [F05] 12/03/2016  . Confusion [R41.0] 12/03/2016  . Diarrhea [R19.7] 12/03/2016  . Metastatic breast cancer ([place]) [C50.919]   . Cerumen impaction [H61.20] 11/25/2016  . Otitis media [H66.90] 11/06/2016  . Brain metastasis ([place]) [C79.31] 07/27/2016  . Iron deficiency anemia [D50.9] 06/26/2016  . Bone metastases ([place]) [C79.51] 06/03/2016  . Primary cancer of lower-inner quadrant of left female breast ([place]) [C50.312] 06/01/2016  . Pap smear for cervical cancer screening [Z12.4] 03/28/2015  . Current smoker [F17.200] 03/28/2015  . Healthcare maintenance [Z00.00] 03/28/2015  . Seasonal allergies [J30.2] 03/28/2015  . Anxiety state [F41.1] 02/28/2015  . Fibromyalgia [M79.7] 02/28/2015  . Family history of diabetes mellitus [Z83.3] 02/28/2015  . H/O alcohol abuse [Z87.898]     Total Time spent with patient: 1 hour  Subjective:   [name] is a 56 y.o. female patient admitted with confusion and hallucination  HPI:  [name] is a 56 years old female seen, chart reviewed for this face-to-face psychiatry consultation and evaluation of medication management for bipolar disorder. Patient reported she continued to be emotional, dysphoric and anxious and also continued to have intermittent confusion. Patient continued to show more swings and irritability has for staff members  report. Patient was previously treated with lithium which caused excessive fine and gross motor shakes both in hands and legs also presented with history of fall at home while confused and has altered mental status. Patient showed some improvement after reducing her lithium but continued to have confusion and constricted incapacitated to make her own medical decisions and living arrangements at this time. Patient has been living by herself and has a 2 roommates and has no family contact during this hospitalization. Patient may be better served with the discontinuation of the lithium due to increased confusion and unable to tolerate and we will start a new medication Trileptal 300 mg twice daily and increase her lamotrigine to 200 mg daily for controlling her mood swings and irritability and depression. As per the physical therapy patient meets criteria for skilled nursing facility with rehabilitation as she was not able to care for herself at this time.  Past Psychiatric History:  Bipolar depression, Anxiety disorder, Delirium  Risk to Self: Suicidal Ideation: No (DENIES) Suicidal Intent: No Is patient at risk for suicide?: No Suicidal Plan?: No Access to Means: No (UNRESPONSIVE- PREVIOUSLY REPORTED NO ACCESS) What has been your use of drugs/alcohol within the last 12 months?:  (DENIES USE ALTHOUGH HX OF POLYSUBSTANCE DEPENDENCE) How many times?:  (0) Other Self Harm Risks:  (NONE REPORTED) Triggers for Past Attempts: None known Intentional Self Injurious Behavior: None Risk to Others: Homicidal Ideation: No (DENIES) Thoughts of Harm to Others: No (DENIES) Current Homicidal Intent: No Current Homicidal Plan: No Access to Homicidal Means: No Identified Victim:  (NA) History of harm to others?: No (NONE REPORTED) Assessment of Violence: None Noted Violent Behavior Description:  (NA) Does patient have access to weapons?: No Criminal Charges Pending?: No  Does patient have a court date:  No Prior Inpatient Therapy: Prior Inpatient Therapy: Yes Prior Therapy Dates:  (2017, 2018) Prior Therapy Facilty/Provider(s):  [facility]) Reason for Treatment:  (PER PT RECORD, BIPOLAR D/O) Prior Outpatient Therapy: Prior Outpatient Therapy: Yes Prior Therapy Dates:  (2017 PER PT RECORD) Prior Therapy Facilty/Provider(s):  ([facility] PER PT RECORD) Reason for Treatment:  (BIPOLAR D/O PER PT RECORD) Does patient have an ACCT team?: No Does patient have Intensive In-House Services?  : No Does patient have [facility] services? : No Does patient have P4CC services?: No  Past Medical History:  Past Medical History:  Diagnosis Date  . Alcohol abuse   . Anemia    during chemo  . Anxiety    At age 68  . Arthritis Dx 2010  . Bipolar disorder ([place])   . Cancer ([address])    breast mets to brain  . Chronic pain   . Complication of anesthesia   . Depression   . Fibromyalgia Dx 2005  . GERD (gastroesophageal reflux disease)   . Headache    hx  migraines  . Opiate dependence ([place])   . PONV (postoperative nausea and vomiting)   . Port-a-cath in place   . PTSD (post-traumatic stress disorder)     Past Surgical History:  Procedure Laterality Date  . APPLICATION OF CRANIAL NAVIGATION N/A 08/14/2016   Procedure: APPLICATION OF CRANIAL NAVIGATION;  Surgeon: Erline Levine, MD;  Location: [place] [facility];  Service: Neurosurgery;  Laterality: N/A;  . BREAST RECONSTRUCTION Left    with silicone implant  . CRANIOTOMY N/A 08/14/2016   Procedure: CRANIOTOMY TUMOR EXCISION WITH Lucky Rathke;  Surgeon: Erline Levine, MD;  Location: [place] [facility];  Service: Neurosurgery;  Laterality: N/A;  . FIBULA FRACTURE SURGERY Left   . MASTECTOMY Left   . RADIOLOGY WITH ANESTHESIA N/A 07/23/2016   Procedure: MRI OF BRAIN WITH AND WITHOUT;  Surgeon: Medication Radiologist, MD;  Location: [facility];  Service: Radiology;  Laterality: N/A;  . RADIOLOGY WITH ANESTHESIA N/A 09/08/2016   Procedure: MRI OF BRAIN WITH AND WITHOUT  CONTRAST;  Surgeon: Medication Radiologist, MD;  Location: [facility];  Service: Radiology;  Laterality: N/A;  . RADIOLOGY WITH ANESTHESIA N/A 12/10/2016   Procedure: MRI OF BRAIN WITH AND WITHOUT;  Surgeon: Medication Radiologist, MD;  Location: [place];  Service: Radiology;  Laterality: N/A;  . RADIOLOGY WITH ANESTHESIA N/A 03/02/2017   Procedure: MRI of BRAIN W and W/OUT CONTRAST;  Surgeon: Medication Radiologist, MD;  Location: [place] [place];  Service: Radiology;  Laterality: N/A;  . right power port placement Right    Family History:  Family History  Problem Relation Age of Onset  . Diabetes Mother   . Bipolar disorder Mother   . CAD Father    Family Psychiatric  History: Noncontributory Social History:  History  Alcohol Use No    Comment: no ETOH since 08/22/12     History  Drug Use No    Comment: prescription opiates from the street daily until Sunday    Social History   Social History  . Marital status: Single    Spouse name: N/A  . Number of children: N/A  . Years of education: N/A   Social History Main Topics  . Smoking status: Current Every Day Smoker    Packs/day: 0.25    Types: Cigarettes  . Smokeless tobacco: Never Used     Comment: Pt is on Chantix at present time  . Alcohol use No  Comment: no ETOH since 08/22/12  . Drug use: No     Comment: prescription opiates from the street daily until Sunday  . Sexual activity: No     Comment: ablation   Other Topics Concern  . None   Social History Narrative  . None   Additional Social History:    Allergies:   Allergies  Allergen Reactions  . Demerol Itching and Nausea And Vomiting  . Erythromycin Rash    Labs:  Results for orders placed or performed during the hospital encounter of 03/16/17 (from the past 48 hour(s))  Lithium level     Status: Abnormal   Collection Time: 03/21/17  6:50 PM  Result Value Ref Range   Lithium Lvl 0.37 (L) 0.60 - 1.20 mmol/L  Creatinine, serum     Status: None   Collection Time:  03/23/17  3:22 AM  Result Value Ref Range   Creatinine, Ser 0.83 0.44 - 1.00 mg/dL   GFR calc non Af Amer >60 >60 mL/min   GFR calc Af Amer >60 >60 mL/min    Comment: (NOTE) The eGFR has been calculated using the CKD EPI equation. This calculation has not been validated in all clinical situations. eGFR's persistently <60 mL/min signify possible Chronic Kidney Disease.   Glucose, capillary     Status: None   Collection Time: 03/23/17  8:55 AM  Result Value Ref Range   Glucose-Capillary 94 65 - 99 mg/dL    Current Facility-Administered Medications  Medication Dose Route Frequency Provider Last Rate Last Dose  . 0.9 %  sodium chloride infusion  250 mL Intravenous PRN Vianne Bulls, MD      . anastrozole (ARIMIDEX) tablet 1 mg  1 mg Oral Daily Vianne Bulls, MD   1 mg at 03/23/17 1031  . enoxaparin (LOVENOX) injection 40 mg  40 mg Subcutaneous Q24H Vianne Bulls, MD   40 mg at 03/23/17 1028  . feeding supplement (ENSURE ENLIVE) (ENSURE ENLIVE) liquid 237 mL  237 mL Oral TID BM Oswald Hillock, MD   237 mL at 03/23/17 1037  . gabapentin (NEURONTIN) capsule 600 mg  600 mg Oral BID Oswald Hillock, MD   600 mg at 03/23/17 1028  . ibuprofen (ADVIL,MOTRIN) tablet 800 mg  800 mg Oral Q6H PRN Vianne Bulls, MD   800 mg at 03/23/17 1141  . lamoTRIgine (LAMICTAL) tablet 150 mg  150 mg Oral Daily Vianne Bulls, MD   150 mg at 03/23/17 1032  . lidocaine-prilocaine (EMLA) cream 1 application  1 application Topical PRN Ilene Qua Opyd, MD      . lithium carbonate (ESKALITH) CR tablet 450 mg  450 mg Oral QHS Ambrose Finland, MD   450 mg at 03/22/17 2049  . loratadine (CLARITIN) tablet 10 mg  10 mg Oral Daily Vianne Bulls, MD   10 mg at 03/23/17 1029  . LORazepam (ATIVAN) injection 1 mg  1 mg Intravenous Q4H PRN Oswald Hillock, MD      . magic mouthwash  5 mL Oral QID PRN Vianne Bulls, MD      . pantoprazole (PROTONIX) EC tablet 40 mg  40 mg Oral Daily Vianne Bulls, MD   40 mg at 03/23/17 1031   . potassium chloride SA (K-DUR,KLOR-CON) CR tablet 10 mEq  10 mEq Oral Daily Vianne Bulls, MD   10 mEq at 03/23/17 1030  . QUEtiapine (SEROQUEL) tablet 50 mg  50 mg Oral QHS Ambrose Finland, MD  50 mg at 03/22/17 2049  . sodium chloride flush (NS) 0.9 % injection 10-40 mL  10-40 mL Intracatheter Q12H Oswald Hillock, MD   10 mL at 03/22/17 2050  . sodium chloride flush (NS) 0.9 % injection 3 mL  3 mL Intravenous Q12H Vianne Bulls, MD   3 mL at 03/22/17 2050  . sodium chloride flush (NS) 0.9 % injection 3 mL  3 mL Intravenous PRN Vianne Bulls, MD      . traMADol (ULTRAM) tablet 50 mg  50 mg Oral Q12H PRN Vianne Bulls, MD   50 mg at 03/22/17 0901  . traZODone (DESYREL) tablet 200 mg  200 mg Oral QHS Vianne Bulls, MD   200 mg at 03/22/17 2049    Musculoskeletal: Strength & Muscle Tone: decreased Gait & Station: unsteady Patient leans: N/A  Psychiatric Specialty Exam: Physical Exam  Psychiatric: Judgment and thought content normal. Her mood appears anxious. Her speech is slurred. She is slowed. Cognition and memory are impaired.   as per history and physical   ROS  generalized weakness, shaking both upper arms and right shoulder and questionable lithium toxicity even though this seems to be therapeutic range. Patient continued to be confused and not able to answer most of the questions.  No Fever-chills, No Headache, No changes with Vision or hearing, reports vertigo No problems swallowing food or Liquids, No Chest pain, Cough or Shortness of Breath, No Abdominal pain, No Nausea or Vommitting, Bowel movements are regular, No Blood in stool or Urine, No dysuria, No new skin rashes or bruises, No new joints pains-aches,  No new weakness, tingling, numbness in any extremity, No recent weight gain or loss, No polyuria, polydypsia or polyphagia,  A full 10 point Review of Systems was done, except as stated above, all other Review of Systems were negative.   Blood pressure  104/62, pulse 86, temperature 98.6 F (37 C), temperature source Axillary, resp. rate 18, height _0  (1.651 m), weight 48.5 kg (107 lb), SpO2 98 %.Body mass index is 17.81 kg/m.  General Appearance: Casual  Eye Contact:  Fair  Speech:  Pressured, Slow and Slurred  Volume:  Decreased  Mood:  Anxious, depressed and dysphoric   Affect:  Constricted  Thought Process:  Disorganized  Orientation:  Other:  Oriented to person and place only  Thought Content:  Illogical, Rumination and Tangential  Suicidal Thoughts:  No  Homicidal Thoughts:  No  Memory:  Immediate;   Fair Recent;   Poor Remote;   Fair  Judgement:  Impaired  Insight:  Shallow  Psychomotor Activity:  Decreased and Restlessness  Concentration:  Concentration: Fair and Attention Span: Poor  Recall:  [employer] of Knowledge:  Fair  Language:  Good  Akathisia:  Negative  Handed:  Right  AIMS (if indicated):     Assets:  Desire for Improvement Housing Leisure Time Resilience  ADL's:  Impaired  Cognition:  Impaired,  Moderate  Sleep:        Treatment Plan Summary:  56 years old female with bipolar disorder and Anxiety who was admitted due to confusion, disorientation, hallucination and bilateral hand tremors.   Patient continue to report fine tremors and moments of confusion.  Patient does not have capacity to make medical decision as she does not fully understand the extent of her current mental and medical conditions due to recurrent disorientation and confusions.   Plan/Recommendations: Discontinue Lithium as she can not stop tremors and shakes Will  start Trileptal 300 mg BID for bipolar disorder starting today. Increase Lamotrigine 200 mg PO QD for mood stabilization  Consider social worker evaluation for placement in Nursing home facility with rehab prior to be on her own with ADLs' Refer patient for outpatient psychiatric follow up upon discharge.  Disposition:  Patient will be better served with the  appropriate medication management and placing skilled nursing facility with rehabilitation Patient does not meet criteria for psychiatric inpatient admission. Supportive therapy provided about ongoing stressors.   Ambrose [place], MD 03/23/2017 12:50 PM

## 2017-03-24 LAB — CBC WITH DIFFERENTIAL/PLATELET
Basophils Absolute: 0 10*3/uL (ref 0.0–0.1)
Basophils Relative: 0 %
Eosinophils Absolute: 0.1 10*3/uL (ref 0.0–0.7)
Eosinophils Relative: 3 %
HCT: 36.7 % (ref 36.0–46.0)
Hemoglobin: 11.8 g/dL — ABNORMAL LOW (ref 12.0–15.0)
Lymphocytes Relative: 24 %
Lymphs Abs: 0.8 10*3/uL (ref 0.7–4.0)
MCH: 30.8 pg (ref 26.0–34.0)
MCHC: 32.2 g/dL (ref 30.0–36.0)
MCV: 95.8 fL (ref 78.0–100.0)
Monocytes Absolute: 0.3 10*3/uL (ref 0.1–1.0)
Monocytes Relative: 8 %
Neutro Abs: 2.2 10*3/uL (ref 1.7–7.7)
Neutrophils Relative %: 65 %
Platelets: 190 10*3/uL (ref 150–400)
RBC: 3.83 MIL/uL — ABNORMAL LOW (ref 3.87–5.11)
RDW: 12.9 % (ref 11.5–15.5)
WBC: 3.4 10*3/uL — ABNORMAL LOW (ref 4.0–10.5)

## 2017-03-24 LAB — COMPREHENSIVE METABOLIC PANEL
ALT: 12 U/L — ABNORMAL LOW (ref 14–54)
AST: 16 U/L (ref 15–41)
Albumin: 3.5 g/dL (ref 3.5–5.0)
Alkaline Phosphatase: 37 U/L — ABNORMAL LOW (ref 38–126)
Anion gap: 7 (ref 5–15)
BUN: 6 mg/dL (ref 6–20)
CO2: 27 mmol/L (ref 22–32)
Calcium: 9.4 mg/dL (ref 8.9–10.3)
Chloride: 105 mmol/L (ref 101–111)
Creatinine, Ser: 0.81 mg/dL (ref 0.44–1.00)
GFR calc Af Amer: >60 mL/min (ref >60)
GFR calc non Af Amer: >60 mL/min (ref >60)
Glucose, Bld: 108 mg/dL — ABNORMAL HIGH (ref 65–99)
Potassium: 3.9 mmol/L (ref 3.5–5.1)
Sodium: 139 mmol/L (ref 135–145)
Total Bilirubin: 0.4 mg/dL (ref 0.3–1.2)
Total Protein: 5.5 g/dL — ABNORMAL LOW (ref 6.5–8.1)

## 2017-03-24 LAB — GLUCOSE, CAPILLARY: Glucose-Capillary: 103 mg/dL — ABNORMAL HIGH (ref 65–99)

## 2017-03-24 NOTE — Progress Notes (Signed)
[facility]  PROGRESS NOTE  [name] IBB:[id number] DOB: 1961-06-04 DOA: 03/16/2017 PCP: Arnoldo Morale, MD   Brief HPI:    56 year old female with history of alcohol abuse, bipolar disorder, left breast cancer with brain metastasis status post mastectomy and craniectomy with radiation to brain and breast came to ED with confusion and hallucinations. Patient started on IV Rocephin for acute cystitis.Patient has been seen by psychiatry and deemed not having decision-making capacity for her medical and psychiatric condition.   Subjective    continues to have intermittent episodes of confusion.  PT recommends patient to go to skilled nursing facility   Assessment/Plan:     1. Acute encephalopathy- , patient has  intermittent episodes of confusion and hallucinations this has been going on since patient had suboccipital craniectomy for resection of posterior fossa metastasis September 2017 requiring multiple hospitalizations. Patient was  started on Rocephin for possible UTI, urine culture  grew multiple species. MRI brain earlier this month was normal CT head was negative during this admission. Rocephin was discontinued. Patient was seen by psychiatry and deemed to have no decision-making capacity. Still having intermittent episodes of confusion.Safety sitter at bedside 2. Abnormal UA-  patient had abnormal UA, started on IV Rocephin empirically . Urine culture grew multiple species. Rocephin was discontinued.No new changes. 3. Bipolar disorder- patient was seen by psychiatry and dose of lithium changed to  450 mg by mouth daily at bedtime. Psych has seen the patient and deemed not having decision-making capacity. Recheck lithium level is 0.37, which is lower than the therapeutic range of 0.60-  1.20. Discussed with psychiatry, they will adjust the dose of lithium or consider stopping  it before discharge. 4. Tremors-resolved, likely from lithium, dose of lithium changed to 450 mg once a  day as above. 5. History of breast cancer with brain metastasis- patient is status post mastectomy and axillary node dissection, suboccipital craniectomy for resection of posterior fossa metastasis, radiation to brain and breast. Continue Arimidex. Patient was seen by Dr. [name] in the hospital, she has an appointment to see oncology as outpatient.    DVT prophylaxis: Lovenox  Code Status: Full code  Family Communication: No family present at bedside  Disposition Plan: Skilled nursing facility  Antibiotics:   Anti-infectives    Start     Dose/Rate Route Frequency Ordered Stop   03/17/17 2000  cefTRIAXone (ROCEPHIN) 1 g in dextrose 5 % 50 mL IVPB  Status:  Discontinued     1 g 100 mL/hr over 30 Minutes Intravenous Every 24 hours 03/16/17 2043 03/18/17 1559   03/16/17 2015  cefTRIAXone (ROCEPHIN) 1 g in dextrose 5 % 50 mL IVPB     1 g 100 mL/hr over 30 Minutes Intravenous  Once 03/16/17 2000 03/16/17 2041       Objective   Vitals:   03/18/17 1031 03/18/17 1711 03/19/17 0433 03/19/17 0809  BP: (!) 106/41 116/65 (!) 73/39 (!) 104/57  Pulse: 96 87 79 89  Resp: 18 19 18 18   Temp:  97.9 F (36.6 C) 98.3 F (36.8 C) 98.7 F (37.1 C)  TempSrc:  Oral Oral Oral  SpO2: 100% 100% 97% 95%  Weight:   40.8 kg (90 lb)     Intake/Output Summary (Last 24 hours) at 03/19/17 1438 Last data filed at 03/19/17 0935  Gross per 24 hour  Intake              713 ml  Output  801 ml  Net              -88 ml   Filed Weights   03/19/17 0433  Weight: 40.8 kg (90 lb)     Physical Examination:  Physical Exam: Eyes: No icterus, extraocular muscles intact tearful Mouth: Oral mucosa is moist, no lesions  Neck: Supple, no deformities, masses, or tenderness Lungs: Normal respiratory effort, bilateral clear to auscultation, Heart: Regular rate and rhythm, S1 and S2 normal, no murmurs, rubs auscultated Abdomen: BS normoactive,soft,nondistended,non-tender to palpation,no  organomegaly    Data Reviewed: I have personally reviewed following labs and imaging studies  CBG:  Recent Labs Lab 03/17/17 0759 03/18/17 1027 03/19/17 0815  GLUCAP 97 128* 105*    CBC:  Recent Labs Lab 03/16/17 1630 03/16/17 2134  WBC 4.5  --   NEUTROABS 3.1  --   HGB 11.5*  --   HCT 36.3 34.3  MCV 96.8  --   PLT 180  --     Basic Metabolic Panel:  Recent Labs Lab 03/16/17 1630 03/17/17 0501  NA 139 141  K 3.9 3.8  CL 105 107  CO2 24 26  GLUCOSE 96 94  BUN 5* 6  CREATININE 0.79 0.71  CALCIUM 9.8 9.4    Recent Results (from the past 240 hour(s))  Culture, Urine     Status: Abnormal   Collection Time: 03/16/17  7:15 PM  Result Value Ref Range Status   Specimen Description URINE, RANDOM  Final   Special Requests NONE  Final   Culture MULTIPLE SPECIES PRESENT, SUGGEST RECOLLECTION (A)  Final   Report Status 03/18/2017 FINAL  Final     Liver Function Tests:  Recent Labs Lab 03/16/17 1630  AST 15  ALT 11*  ALKPHOS 34*  BILITOT 0.5  PROT 5.5*  ALBUMIN 3.8   No results for input(s): LIPASE, AMYLASE in the last 168 hours.  Recent Labs Lab 03/16/17 2100  AMMONIA 16    Studies: Ct Chest W Contrast  Result Date: 03/18/2017 CLINICAL DATA:  Breast cancer.  Short breath.  Brain metastasis. EXAM: CT CHEST WITH CONTRAST TECHNIQUE: Multidetector CT imaging of the chest was performed during intravenous contrast administration. CONTRAST:  77mL ISOVUE-300 IOPAMIDOL (ISOVUE-300) INJECTION 61% COMPARISON:  CT 11/10/2016 FINDINGS: Cardiovascular: Port in RIGHT chest wall with tip the distal SVC. No significant vascular findings. Normal heart size. No pericardial effusion. Mediastinum/Nodes: No axillary supraclavicular adenopathy. No mediastinal hilar adenopathy Lungs/Pleura: Mild atelectasis lung bases. Mild linear scarring LEFT lung apex. No suspicious nodularity. Pulmonary edema or pulmonary infection. Upper Abdomen: Limited view of the liver, kidneys,  pancreas are unremarkable. Normal adrenal glands. Multiple small hepatic hypodensities likely represent benign cysts for not comparison Musculoskeletal: Scatter sclerotic lesions in the spine for stable. Post LEFT mastectomy with breast prosthetic. IMPRESSION: 1. No evidence of breast cancer progression. 2. No acute pulmonary parenchymal findings. 3. Stable sclerotic lesions in the thoracic spine. Electronically Signed   By: Suzy Bouchard M.D.   On: 03/18/2017 12:14    Scheduled Meds: . anastrozole  1 mg Oral Daily  . enoxaparin (LOVENOX) injection  40 mg Subcutaneous Q24H  . gabapentin  600 mg Oral BID  . lamoTRIgine  150 mg Oral Daily  . lithium carbonate  450 mg Oral QHS  . loratadine  10 mg Oral Daily  . pantoprazole  40 mg Oral Daily  . potassium chloride  10 mEq Oral Daily  . QUEtiapine  50 mg Oral QHS  . sodium chloride flush  10-40 mL Intracatheter Q12H  . sodium chloride flush  3 mL Intravenous Q12H  . traZODone  200 mg Oral QHS      Time spent: 15 min  Verneita Griffes, MD [facility] [phone]   03/19/2017, 2:38 PM  LOS: 2 days

## 2017-03-24 NOTE — Clinical Social Work Note (Signed)
Clinical Social Work Assessment  Patient Details  Name: [name] MRN: [id number] Date of Birth: 01-Aug-1961  Date of referral:  03/19/17               Reason for consult:  Facility Placement                Permission sought to share information with:  Family Supports Permission granted to share information::  No (Patient determined to lack capacity to make decisions. Son has been contacted regarding discharge planning)  Name::     [name]  Agency::     Relationship::  Son  Contact Information:  [phone]  Housing/Transportation Living arrangements for the past 2 months:  Group Home ([facility] per CSW note dated 4/2) Source of Information:  Adult Children, Other (Comment Required) (Social worker Solicitor [phone])) Patient Interpreter Needed:  None Criminal Activity/Legal Involvement Pertinent to Current Situation/Hospitalization:  No - Comment as needed Significant Relationships:  Adult Children Lives with:  Other (Comment) ([facility]) Do you feel safe going back to the place where you live?  No (Patient wants to discharge home, however son concerns about this disposition and is agreeable to SNF/ALF) Need for family participation in patient care:  Yes (Comment)  Care giving concerns: Son concerned about his mother's ability to properly care for herself and wanted placement for her: ALF or SNF. CSW talked with son by phone as patient's orientation has fluctuated.  Social Worker assessment / plan:  CSW talked with son [name] on 4/23 and 4/24 regarding patient's discharge disposition. [name] concerned about his mother's ability to properly care for herself. He reported that his mother is very stubborn and hard-headed. When asked, [name] reported that he talked with his mom by phone every couple of weeks. Son indicated that the ambulance had been coming to where his mom lives weekly for a couple of months to bring her to the hospital. Son added that since his mom had surgery  on the back of her head, things have declined with her.  When asked,  [name] reported that his mom has been at the current address for 2 years. [name] advised CSW that other family include his younger brother, his maternal grandfather and maternal aunt [name].  [name] is agreeable to placement for his mother: SNF or ALF.  On 4/23 CSW also talked with son about patient needing to apply for Medicaid and SSA disability. Son advised that patient's Medicaid is probably family planning Medicaid and she would need LTC Medicaid to pay a facility. Son agreeable to assist and was given social services address and phone number and asked to contact social security regarding applying for disability for his mom.    Employment status:  Unemployed Forensic scientist:  Medicaid [place] (Patient has family/children's MA (probably family planning medicaid as she does not have minor children). Per CSW [name], patient has applied for SSI & SSA disability through the [facility], however she is not sure of the status of these applications) PT Recommendations:  [place] / Referral to community resources:  [place] (CSW has been talking with son by phone as he lives in [place])  Patient/Family's Response to care:  No concerns expressed regarding care during hospitalization.  Patient/Family's Understanding of and Emotional Response to Diagnosis, Current Treatment, and Prognosis:  Not discussed.  Emotional Assessment Appearance:  Appears stated age Attitude/Demeanor/Rapport:  Uncooperative Affect (typically observed):  Other, Irritable (Confused and irritable at times) Orientation:  Fluctuating  Orientation (Suspected and/or reported Sundowners) (4/25 patient is Oriented X 4) Alcohol / Substance use:  Tobacco Use, Alcohol Use, Illicit Drugs (Patient reports that she smokes and does not drink or use illicit drugs) Psych involvement (Current and /or in the community):  Yes  (Comment)  Discharge Needs  Concerns to be addressed:  Discharge Planning Concerns, Home Safety Concerns, Decision making concerns Readmission within the last 30 days:  Yes Current discharge risk:  Psychiatric Illness, Lack of support system (No family in [place]) Barriers to Discharge:  No SNF bed, Inadequate or no insurance   [name], [place] 03/24/2017, 2:57 PM

## 2017-03-25 ENCOUNTER — Telehealth: Payer: Self-pay

## 2017-03-25 ENCOUNTER — Encounter: Payer: Self-pay | Admitting: *Deleted

## 2017-03-25 ENCOUNTER — Telehealth: Payer: Self-pay | Admitting: *Deleted

## 2017-03-25 LAB — GLUCOSE, CAPILLARY: Glucose-Capillary: 106 mg/dL — ABNORMAL HIGH (ref 65–99)

## 2017-03-25 MED ORDER — TRASTUZUMAB CHEMO 150 MG IV SOLR: 6.0000 mg/kg | Freq: Once | INTRAVENOUS | Status: DC

## 2017-03-25 MED ORDER — LORAZEPAM 1 MG PO TABS
1.0000 mg | ORAL_TABLET | ORAL | Status: DC | PRN
  Administered 2017-03-26 – 2017-04-07 (×16): 1 mg via ORAL
  Filled 2017-03-25 (×16): qty 1

## 2017-03-25 MED ORDER — METOCLOPRAMIDE HCL 5 MG/ML IJ SOLN
10.0000 mg | Freq: Three times a day (TID) | INTRAMUSCULAR | Status: DC | PRN
  Administered 2017-03-25 – 2017-03-27 (×2): 10 mg via INTRAVENOUS
  Filled 2017-03-25 (×2): qty 2

## 2017-03-25 NOTE — Progress Notes (Signed)
[place] [facility]  [place] Work has been following due to recent inpatient admission. Clinical Social Worker contacted [employer] today to follow up on status of SSD/SSI application of patient. CSW spoke with Gustavus Bryant, who has been working hard to get more information. Vaughan Basta has now gone up the chain of command at [facility] in order to get this issued resolved. Per her report, pt was initially denied, not based on diagnosis or condition, but due to lack of documentation. Vaughan Basta is attempting to find out what information is needed in order to resolve the situation. This CSW had inquired to pt several weeks ago about status of applications, but pt could not sufficiently provide information. CSW at [facility] will follow remotely and update inpt team accordingly.    Clinical Social Work interventions:  Pt advocacy  Loren Racer, LCSW, OSW-C Clinical Social Worker [place]  [place] Phone: [phone] Fax: [phone]

## 2017-03-25 NOTE — Progress Notes (Signed)
[facility]  PROGRESS NOTE  [name] ESP:[id number] DOB: 31-Aug-1961 DOA: 03/16/2017 PCP: Arnoldo Morale, MD   Brief HPI:    56 year old female with history of alcohol abuse, bipolar disorder, left breast cancer with brain metastasis status post mastectomy and craniectomy with radiation to brain and breast came to ED with confusion and hallucinations. Patient started on IV Rocephin for acute cystitis.Patient has been seen by psychiatry and deemed not having decision-making capacity for her medical and psychiatric condition.   Subjective   Tearful upset  "I want to go home" Redirectable Still confused   Assessment/Plan:     1. Acute encephalopathy- , patient has  intermittent episodes of confusion and hallucinations this has been going on since patient had suboccipital craniectomy for resection of posterior fossa metastasis September 2017 requiring multiple hospitalizations. Patient was  started on Rocephin for possible UTI, urine culture  grew multiple species. MRI brain earlier this month was normal CT head was negative during this admission. Rocephin was discontinued. Patient was seen by psychiatry and deemed to have no decision-making capacity. Still having intermittent episodes of confusion.  Safety sitter at bedside--awaiting placement 2. Abnormal UA-  patient had abnormal UA, started on IV Rocephin empirically . Urine culture grew multiple species. Rocephin was discontinued.No new changes. 3. Bipolar disorder- patient was seen by psychiatry and dose of lithium changed to  450 mg by mouth daily at bedtime. Psych has seen the patient and deemed not having decision-making capacity. Recheck lithium level is 0.37, which is lower than the therapeutic range of 0.60-  1.20. Discussed with psychiatry, they will adjust the dose of lithium or consider stopping  it before discharge.  On Trileptal 300 bid and Gabapentin 600 bid, lamictal 200 qd, seroquel 50 hs, trazodone 200 hs and ativan 1  mg q4 prn 4. Tremors-resolved, likely from lithium, dose of lithium changed to 450 mg once a day as above. 5. History of breast cancer with brain metastasis- patient is status post mastectomy and axillary node dissection, suboccipital craniectomy for resection of posterior fossa metastasis, radiation to brain and breast. Continue Arimidex. Patient was seen by Dr. [name] in the hospital-currently Herceptin on hold    DVT prophylaxis: Lovenox  Code Status: Full code  Family Communication: No family present at bedside-d/w son on phone  Disposition Plan: Skilled nursing facility when can get placement  Antibiotics:   Anti-infectives    Start     Dose/Rate Route Frequency Ordered Stop   03/17/17 2000  cefTRIAXone (ROCEPHIN) 1 g in dextrose 5 % 50 mL IVPB  Status:  Discontinued     1 g 100 mL/hr over 30 Minutes Intravenous Every 24 hours 03/16/17 2043 03/18/17 1559   03/16/17 2015  cefTRIAXone (ROCEPHIN) 1 g in dextrose 5 % 50 mL IVPB     1 g 100 mL/hr over 30 Minutes Intravenous  Once 03/16/17 2000 03/16/17 2041       Objective   Vitals:   03/18/17 1031 03/18/17 1711 03/19/17 0433 03/19/17 0809  BP: (!) 106/41 116/65 (!) 73/39 (!) 104/57  Pulse: 96 87 79 89  Resp: 18 19 18 18   Temp:  97.9 F (36.6 C) 98.3 F (36.8 C) 98.7 F (37.1 C)  TempSrc:  Oral Oral Oral  SpO2: 100% 100% 97% 95%  Weight:   40.8 kg (90 lb)     Intake/Output Summary (Last 24 hours) at 03/19/17 1438 Last data filed at 03/19/17 0935  Gross per 24 hour  Intake  713 ml  Output              801 ml  Net              -88 ml   Filed Weights   03/19/17 0433  Weight: 40.8 kg (90 lb)     Physical Examination:  Physical Exam: Eyes: No icterus, extraocular muscles intact tearful Mouth: Oral mucosa is moist, no lesions  Neck: Supple, no deformities, masses, or tenderness Lungs: Normal respiratory effort, bilateral clear to auscultation, Heart: Regular rate and rhythm, S1 and S2 normal, no  murmurs, rubs auscultated   Data Reviewed: I have personally reviewed following labs and imaging studies  CBG:  Recent Labs Lab 03/17/17 0759 03/18/17 1027 03/19/17 0815  GLUCAP 97 128* 105*    CBC:  Recent Labs Lab 03/16/17 1630 03/16/17 2134  WBC 4.5  --   NEUTROABS 3.1  --   HGB 11.5*  --   HCT 36.3 34.3  MCV 96.8  --   PLT 180  --     Basic Metabolic Panel:  Recent Labs Lab 03/16/17 1630 03/17/17 0501  NA 139 141  K 3.9 3.8  CL 105 107  CO2 24 26  GLUCOSE 96 94  BUN 5* 6  CREATININE 0.79 0.71  CALCIUM 9.8 9.4    Recent Results (from the past 240 hour(s))  Culture, Urine     Status: Abnormal   Collection Time: 03/16/17  7:15 PM  Result Value Ref Range Status   Specimen Description URINE, RANDOM  Final   Special Requests NONE  Final   Culture MULTIPLE SPECIES PRESENT, SUGGEST RECOLLECTION (A)  Final   Report Status 03/18/2017 FINAL  Final     Liver Function Tests:  Recent Labs Lab 03/16/17 1630  AST 15  ALT 11*  ALKPHOS 34*  BILITOT 0.5  PROT 5.5*  ALBUMIN 3.8   No results for input(s): LIPASE, AMYLASE in the last 168 hours.  Recent Labs Lab 03/16/17 2100  AMMONIA 16    Studies: Ct Chest W Contrast  Result Date: 03/18/2017 CLINICAL DATA:  Breast cancer.  Short breath.  Brain metastasis. EXAM: CT CHEST WITH CONTRAST TECHNIQUE: Multidetector CT imaging of the chest was performed during intravenous contrast administration. CONTRAST:  20mL ISOVUE-300 IOPAMIDOL (ISOVUE-300) INJECTION 61% COMPARISON:  CT 11/10/2016 FINDINGS: Cardiovascular: Port in RIGHT chest wall with tip the distal SVC. No significant vascular findings. Normal heart size. No pericardial effusion. Mediastinum/Nodes: No axillary supraclavicular adenopathy. No mediastinal hilar adenopathy Lungs/Pleura: Mild atelectasis lung bases. Mild linear scarring LEFT lung apex. No suspicious nodularity. Pulmonary edema or pulmonary infection. Upper Abdomen: Limited view of the liver,  kidneys, pancreas are unremarkable. Normal adrenal glands. Multiple small hepatic hypodensities likely represent benign cysts for not comparison Musculoskeletal: Scatter sclerotic lesions in the spine for stable. Post LEFT mastectomy with breast prosthetic. IMPRESSION: 1. No evidence of breast cancer progression. 2. No acute pulmonary parenchymal findings. 3. Stable sclerotic lesions in the thoracic spine. Electronically Signed   By: Suzy Bouchard M.D.   On: 03/18/2017 12:14    Scheduled Meds: . anastrozole  1 mg Oral Daily  . enoxaparin (LOVENOX) injection  40 mg Subcutaneous Q24H  . gabapentin  600 mg Oral BID  . lamoTRIgine  150 mg Oral Daily  . lithium carbonate  450 mg Oral QHS  . loratadine  10 mg Oral Daily  . pantoprazole  40 mg Oral Daily  . potassium chloride  10 mEq Oral Daily  . QUEtiapine  50 mg Oral QHS  . sodium chloride flush  10-40 mL Intracatheter Q12H  . sodium chloride flush  3 mL Intravenous Q12H  . traZODone  200 mg Oral QHS      Time spent: 15 min  Verneita Griffes, MD [facility] [phone]   03/19/2017, 2:38 PM  LOS: 2 days

## 2017-03-25 NOTE — Progress Notes (Signed)
Contacted Shannon Dash RN regarding this patients mental status and her ability to sign consent for Herceptin immunotherapy that's scheduled for 03/26/17. According to the psychiatrist (Dr.Jonnalagadda) note on April 24th, 2018 this patient is "continued to have confusion and constricted incapacitated to make her own medical decisions and living arrangements at this time.". Per policy, the ordering physician must review treatment plan and get a consent prior to the infusion of this medication. Per, Shannon Dash RN there is not a consent in the chart at this time. A call was made to Dr. Virgie Dad nurse, she will follow up with the provider and contact the IV team on next steps. [name]

## 2017-03-25 NOTE — Progress Notes (Signed)
Physical Therapy Treatment Patient Details Name: [name] MRN: [id number] DOB: 1961-08-31 Today's Date: 03/25/2017    History of Present Illness Pt is a 56 y/o female presented to the ED with confusion and hallucinations. Of note, pt recently d/c'd from [facility] on 03/01/17 for similar issues. 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    PT Comments    Pt continues to have decreased cognitive status limiting her safety with mobility. Assisted patient to bathroom, changing gowns, and perform gait in hallway with close CGA due to scissoring gait. Pt is assisted back to room with chair alarm in place. Assisted pt in troubleshooting use of phone and left written instructions, pt has very limited short term memory and already forgot about written instructions before PT left the room. Pt continues to benefit from SNF placement unless she is able to have 24 hr supervision at home.     Follow Up Recommendations  SNF;Supervision/Assistance - 24 hour     Equipment Recommendations  None recommended by PT    Recommendations for Other Services       Precautions / Restrictions Precautions Precautions: Fall Precaution Comments: impulsive Restrictions Weight Bearing Restrictions: No    Mobility  Bed Mobility Overal bed mobility: Needs Assistance Bed Mobility: Supine to Sit     Supine to sit: Supervision     General bed mobility comments: Supervision for safety and to assist with railing  Transfers Overall transfer level: Needs assistance Equipment used: None Transfers: Sit to/from Stand Sit to Stand: Min guard         General transfer comment: Min guard for safety due to unsteady balance  Ambulation/Gait Ambulation/Gait assistance: Min guard;Min assist   Assistive device: Rolling walker (2 wheeled) Gait Pattern/deviations: Step-through pattern;Decreased step length - right;Decreased step length - left;Decreased  stride length;Drifts right/left Gait velocity: decreased Gait velocity interpretation: Below normal speed for age/gender General Gait Details: pt very unsafe with ambulation requiring constant min A for safety with RW and for navigating safely in hallway. Cues for RW proximity and feet positioning in RW.   Stairs            Wheelchair Mobility    Modified Rankin (Stroke Patients Only)       Balance Overall balance assessment: Needs assistance Sitting-balance support: No upper extremity supported;Feet supported Sitting balance-Leahy Scale: Good     Standing balance support: No upper extremity supported;During functional activity Standing balance-Leahy Scale: Fair Standing balance comment: min guard to supervision for safety                            Cognition Arousal/Alertness: Awake/alert Behavior During Therapy: Impulsive Overall Cognitive Status: No family/caregiver present to determine baseline cognitive functioning Area of Impairment: Attention;Memory;Following commands;Safety/judgement;Awareness;Problem solving                 Orientation Level: Disoriented to;Situation Current Attention Level: Focused Memory: Decreased short-term memory Following Commands: Follows multi-step commands consistently Safety/Judgement: Decreased awareness of safety;Decreased awareness of deficits Awareness: Intellectual Problem Solving: Slow processing;Difficulty sequencing General Comments: improved cognitive status, continues to have short term memory loss      Exercises      General Comments General comments (skin integrity, edema, etc.): Pt was yelling out into hallway that she needed to use the bathroom.       Pertinent Vitals/Pain Pain Assessment: No/denies pain    Home Living  Prior Function            PT Goals (current goals can now be found in the care plan section) Acute Rehab PT Goals Patient Stated Goal: to  get out of the hospital Progress towards PT goals: Progressing toward goals    Frequency    Min 3X/week      PT Plan Current plan remains appropriate    Co-evaluation             End of Session Equipment Utilized During Treatment: Gait belt Activity Tolerance: Patient tolerated treatment well Patient left: in chair;with call bell/phone within reach;with chair alarm set Nurse Communication: Mobility status PT Visit Diagnosis: Unsteadiness on feet (R26.81);Other abnormalities of gait and mobility (R26.89);Muscle weakness (generalized) (M62.81)     Time: 0086-7619 PT Time Calculation (min) (ACUTE ONLY): 30 min  Charges:  $Gait Training: 8-22 mins $Therapeutic Activity: 8-22 mins                    G Codes:       [name] PT, DPT  [phone]    Jacqulyn Liner Sloan Leiter 03/25/2017, 12:55 PM

## 2017-03-25 NOTE — Clinical Social Work Note (Addendum)
Clinical Social Work continuing efforts to locate placement and confirm SSA/SSI applications with social security, thanks to the efforts of colleague Loren Racer - see her 03/25/17 progress note.  CSW submitted clinicals for PASRR number on 4/26.  CSW attempted to reach patient's son, [name] [phone]) twice today and messages left requesting a return call. CSW was able to determine that patient's Medicaid is most likely [facility] (which will not pay for placement in a skilled facility) and an Adult Medicaid application will need to be completed. CSW will continue efforts to reach son, get an adult Medicaid application completed, confirm SSA applications and locate a facility that will take patient.  Mercedes [place], MSW, LCSW Licensed Clinical Social Worker [place] [phone]

## 2017-03-25 NOTE — Telephone Encounter (Signed)
This RN will cancel pt's appt for tomorrow since she is in the hospital

## 2017-03-25 NOTE — Telephone Encounter (Signed)
Call received from [facility] with the IV team at [facility] hospital to inform Dr. [name] that Herceptin will not be able to be given as ordered tomorrow as inpatient on [place] unless consent is obtained by Dr. [name] d/t Dr. Barnetta Hammersmith assessment of patient on 03/23/17.  Dr. Barnetta Hammersmith note states that patient "does not have capacity to make medical decisions".  Will forward this message to Dr. [name] and his desk nurse.  Christina's call back number is [phone].

## 2017-03-25 NOTE — Progress Notes (Signed)
Nutrition Follow-up  DOCUMENTATION CODES:   Not applicable  INTERVENTION:   -Continue Ensure Enlive po TID, each supplement provides 350 kcal and 20 grams of protein  NUTRITION DIAGNOSIS:   Predicted suboptimal nutrient intake related to cancer and cancer related treatments as evidenced by percent weight loss.  Progressing  GOAL:   Patient will meet greater than or equal to 90% of their needs  Progressing  MONITOR:   PO intake, Supplement acceptance, Labs, Weight trends, Skin, I & O's  REASON FOR ASSESSMENT:   Rounds    ASSESSMENT:   [name] is a 56 y.o. 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  Pt sitting in recliner chair at time of visit.   Pt reports her appetite "comes and goes", however, this is typical for her. She reports she ate a good dinner yesterday, but was unable to recall how she did at breakfast and lunch today. Sitter at bedside stated that she was not present for lunch meal. Meal intake is variable, PO: 10-100%.   Pt reports she enjoys Ensure supplements and consumed 1-2 bottles daily at home. Ensure ordered TID. Discussed importance of good meal and supplement intake to promote healing.   Pt became very tearful at time of visit and expressed frustration over being unable to go home. Psychiatry following; pt lacks decision-making capacity. Per discussion with nursing student, plan is for MD to discuss with pt's son tomorrow regarding pt's most appropriate discharge disposition.   Abbreviated Nutrition-Focused physical exam completed. Findings are no fat depletion, no muscle depletion, and no edema. Pt appears small framed at baseline.   Noted most recent wt of 122#; based upon visualization of pt,  weight appears to be more accurate than admission wt of 90#. Noted a 25# (17%) wt loss over the past 3 months, which is significant for time frame.   Pt is at high risk for malnutrition given variable PO intake, significant wt loss, and increased nutrient needs for cancer and cancer related treatments. Given pt's inability to provide accurate hx, unable to identify malnutrition at this time.   Labs reviewed: CBGS: 94-106.   Diet Order:  Diet regular Room service appropriate? Yes; Fluid consistency: Thin  Skin:  Reviewed, no issues  Last BM:  03/23/17  Height:   Ht Readings from Last 1 Encounters:  03/24/17 5\' 5"  (1.651 m)    Weight:   Wt Readings from Last 1 Encounters:  03/24/17 122 lb 8 oz (55.6 kg)    Ideal Body Weight:  56.8 kg  BMI:  Body mass index is 20.39 kg/m.  Estimated Nutritional Needs:   Kcal:  1600-1800  Protein:  80-95 grams  Fluid:  1.6-1.8 L  EDUCATION NEEDS:   Education needs no appropriate at this time  Bernestine Holsapple A. Jimmye Norman, RD, LDN, CDE Pager: [phone] After hours Pager: [phone]

## 2017-03-25 NOTE — Progress Notes (Signed)
I have discussed continuing Harceptin with Kandra's son [name], and he has given approval. I wonder how we can facilitate the approval process from this point-- can we fax a note to [employer] that he can sign? I will be by tomorrow around 7 AMAM and can sign the form as well at that time

## 2017-03-25 NOTE — Progress Notes (Signed)
[name] is due for her transtuzumab dose tomorrow. I have written the order and alerted pharmacy. I will see the pt in AM and check labs, but this is a form of immuotherapy, not chemotherapy, and I don't anticipate any barriers to her receiving ehr treamttnet tomorrow.  Please contact me if you have questions or concerns  CASE SUMMARY: 56 y.o. 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  (2) status post left mastectomy and axillary lymph node dissection with immediate expander placement 07/18/2015 for an mpT4 pN2,stage IIIB invasive ductal carcinoma, grade 3, with negative margins. (a) definitive implant exchange to be scheduled in December  METASTATIC DISEASE: October 2016  (3) CT scan of the chest abdomen and pelvis 09/16/2015 shows metastatic lesions in the right scapula, left iliac crest, L4, and T spine. There were questionable liver cysts, with repeat CT scan 03/02/2016 showing possible right upper lobe lung lesions and possibly increased liver lesions (a) CT scan of the chest 06/17/2016 shows no active disease in the lungs or liver (b) Bone scan July 2017 showed no evidence of bony metastatic disease  (c) head CT 07/08/2016 showed a cerebellar lesion, confirmed by MRI 07/23/2016, status post craniotomy 08/14/2016, confirming a metastatic deposit which was estrogen and progesterone receptor negative, HER-2 amplified with a signals ratio of 7.16, number per cell 13.25 (d) CA 27-29 not informative  (4) received docetaxel every 3 weeks 6 together with trastuzumab and pertuzumab, last docetaxel dose 02/11/2016  (5) adjuvant radiation7/03/2016 to  06/26/2016 at [facility]: 1. The Left chest wall was treated to 23.4 Gy in 13 fractions at 1.8 Gy per fraction. 2. The Left chest wall was boosted to 10 Gy in 5 fractions at 2 Gy per fraction. 3. The Left Sclav/PAB was treated to 23.4 Gy in 13 fractions at 1.8 Gy per fraction.  Including the patient's treatment in [place] (received 15 fractions per Dr. Maryan Rued near [place], [place]), the patient received 50.4 Gy to the left chest wall and supraclavicular region.  (6) started trastuzumab and pertuzumabOctober 2016, continuing every 3 weeks, (a) echocardiogram 02/26/2016 showed a well preserved ejection fraction (b) echocardiogram 07/01/2016 shows an ejection fraction in the 60-65%  (c) pertuzumab discontinued 08/25/2016 with uncontrolled diarrhea (d) echocardiogram 11/11/2016 showed an ejection fraction in the 60-65%  (7) started denosumab/XgevaOctober 2017, repeated every 6 weeks  (8) started anastrozole October 2017  (a) bone scan 11/10/2016 shows no active disease (b) chest CT scan 11/10/2016 stable, with no evidence of active disease  (9) history of bipolar disorder (a) lithium level checked every 6 weeks  (10) mild anemia with a significant drop in the MCV, ferritin 10 06/03/2016,  (a) Feraheme given 06/12/2016 and 06/18/2016  (11) tobacco abuse: Chantix started 06/18/2016  (12) 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 (a) brain irradiation10/19/17-11/08/17:Whole brain/ 35 Gy in 14 fractions (b) repeat brain MRI obtained 12/10/2016 shows no active disease in the brain             (c) repeat brain MRI under sedation 03/02/2017 shows NAD

## 2017-03-26 ENCOUNTER — Telehealth: Payer: Self-pay | Admitting: Radiation Oncology

## 2017-03-26 ENCOUNTER — Encounter: Payer: Self-pay | Admitting: General Practice

## 2017-03-26 ENCOUNTER — Ambulatory Visit: Payer: Self-pay | Admitting: Oncology

## 2017-03-26 ENCOUNTER — Encounter: Payer: Self-pay | Admitting: *Deleted

## 2017-03-26 ENCOUNTER — Ambulatory Visit: Payer: Self-pay

## 2017-03-26 ENCOUNTER — Other Ambulatory Visit: Payer: Self-pay

## 2017-03-26 DIAGNOSIS — C50312 Malignant neoplasm of lower-inner quadrant of left female breast: Secondary | ICD-10-CM

## 2017-03-26 DIAGNOSIS — C7951 Secondary malignant neoplasm of bone: Secondary | ICD-10-CM

## 2017-03-26 DIAGNOSIS — R410 Disorientation, unspecified: Secondary | ICD-10-CM

## 2017-03-26 DIAGNOSIS — F419 Anxiety disorder, unspecified: Secondary | ICD-10-CM

## 2017-03-26 DIAGNOSIS — Z171 Estrogen receptor negative status [ER-]: Secondary | ICD-10-CM

## 2017-03-26 LAB — CBC WITH DIFFERENTIAL/PLATELET
Basophils Absolute: 0 10*3/uL (ref 0.0–0.1)
Basophils Relative: 0 %
Eosinophils Absolute: 0.1 10*3/uL (ref 0.0–0.7)
Eosinophils Relative: 3 %
HCT: 35.1 % — ABNORMAL LOW (ref 36.0–46.0)
Hemoglobin: 11.4 g/dL — ABNORMAL LOW (ref 12.0–15.0)
Lymphocytes Relative: 29 %
Lymphs Abs: 1.1 10*3/uL (ref 0.7–4.0)
MCH: 30.7 pg (ref 26.0–34.0)
MCHC: 32.5 g/dL (ref 30.0–36.0)
MCV: 94.6 fL (ref 78.0–100.0)
Monocytes Absolute: 0.4 10*3/uL (ref 0.1–1.0)
Monocytes Relative: 10 %
Neutro Abs: 2.2 10*3/uL (ref 1.7–7.7)
Neutrophils Relative %: 58 %
Platelets: 179 10*3/uL (ref 150–400)
RBC: 3.71 MIL/uL — ABNORMAL LOW (ref 3.87–5.11)
RDW: 12.8 % (ref 11.5–15.5)
WBC: 3.8 10*3/uL — ABNORMAL LOW (ref 4.0–10.5)

## 2017-03-26 LAB — COMPREHENSIVE METABOLIC PANEL
ALT: 12 U/L — ABNORMAL LOW (ref 14–54)
AST: 14 U/L — ABNORMAL LOW (ref 15–41)
Albumin: 3.4 g/dL — ABNORMAL LOW (ref 3.5–5.0)
Alkaline Phosphatase: 33 U/L — ABNORMAL LOW (ref 38–126)
Anion gap: 4 — ABNORMAL LOW (ref 5–15)
BUN: 6 mg/dL (ref 6–20)
CO2: 29 mmol/L (ref 22–32)
Calcium: 9.1 mg/dL (ref 8.9–10.3)
Chloride: 108 mmol/L (ref 101–111)
Creatinine, Ser: 0.68 mg/dL (ref 0.44–1.00)
GFR calc Af Amer: >60 mL/min (ref >60)
GFR calc non Af Amer: >60 mL/min (ref >60)
Glucose, Bld: 101 mg/dL — ABNORMAL HIGH (ref 65–99)
Potassium: 3.4 mmol/L — ABNORMAL LOW (ref 3.5–5.1)
Sodium: 141 mmol/L (ref 135–145)
Total Bilirubin: 0.2 mg/dL — ABNORMAL LOW (ref 0.3–1.2)
Total Protein: 5.2 g/dL — ABNORMAL LOW (ref 6.5–8.1)

## 2017-03-26 LAB — GLUCOSE, CAPILLARY: Glucose-Capillary: 101 mg/dL — ABNORMAL HIGH (ref 65–99)

## 2017-03-26 MED ORDER — SODIUM CHLORIDE 0.9 % IV SOLN: Freq: Once | INTRAVENOUS | Status: DC

## 2017-03-26 MED ORDER — DIPHENHYDRAMINE HCL 25 MG PO CAPS: 25.0000 mg | ORAL_CAPSULE | Freq: Once | ORAL | Status: DC

## 2017-03-26 MED ORDER — EPINEPHRINE PF 1 MG/10ML IJ SOSY: 0.2500 mg | PREFILLED_SYRINGE | Freq: Once | INTRAMUSCULAR | Status: DC | PRN

## 2017-03-26 MED ORDER — SODIUM CHLORIDE 0.9 % IV SOLN: Freq: Once | INTRAVENOUS | Status: DC | PRN

## 2017-03-26 MED ORDER — TRASTUZUMAB CHEMO 150 MG IV SOLR
300.0000 mg | Freq: Once | INTRAVENOUS | Status: AC
  Administered 2017-03-26: 300 mg via INTRAVENOUS
  Filled 2017-03-26: qty 14.29

## 2017-03-26 MED ORDER — DIPHENHYDRAMINE HCL 50 MG/ML IJ SOLN: 50.0000 mg | Freq: Once | INTRAMUSCULAR | Status: DC | PRN

## 2017-03-26 MED ORDER — DIPHENHYDRAMINE HCL 25 MG PO CAPS
25.0000 mg | ORAL_CAPSULE | Freq: Once | ORAL | Status: DC
  Filled 2017-03-26: qty 1

## 2017-03-26 MED ORDER — DIPHENHYDRAMINE HCL 50 MG/ML IJ SOLN: 25.0000 mg | Freq: Once | INTRAMUSCULAR | Status: DC | PRN

## 2017-03-26 MED ORDER — ACETAMINOPHEN 325 MG PO TABS
650.0000 mg | ORAL_TABLET | Freq: Once | ORAL | Status: AC
  Administered 2017-03-26: 650 mg via ORAL
  Filled 2017-03-26: qty 2

## 2017-03-26 MED ORDER — METHYLPREDNISOLONE SODIUM SUCC 125 MG IJ SOLR: 125.0000 mg | Freq: Once | INTRAMUSCULAR | Status: DC | PRN

## 2017-03-26 MED ORDER — ALBUTEROL SULFATE (2.5 MG/3ML) 0.083% IN NEBU: 2.5000 mg | INHALATION_SOLUTION | Freq: Once | RESPIRATORY_TRACT | Status: DC | PRN

## 2017-03-26 MED ORDER — OXCARBAZEPINE 300 MG PO TABS
450.0000 mg | ORAL_TABLET | Freq: Two times a day (BID) | ORAL | Status: DC
  Administered 2017-03-26 – 2017-04-04 (×17): 450 mg via ORAL
  Filled 2017-03-26 (×18): qty 1

## 2017-03-26 MED ORDER — FAMOTIDINE IN NACL 20-0.9 MG/50ML-% IV SOLN
20.0000 mg | Freq: Once | INTRAVENOUS | Status: DC | PRN
  Filled 2017-03-26: qty 50

## 2017-03-26 MED ORDER — EPINEPHRINE PF 1 MG/ML IJ SOLN
0.5000 mg | Freq: Once | INTRAMUSCULAR | Status: DC | PRN
  Filled 2017-03-26: qty 1

## 2017-03-26 MED ORDER — ACETAMINOPHEN 325 MG PO TABS: 650.0000 mg | ORAL_TABLET | Freq: Once | ORAL | Status: DC

## 2017-03-26 NOTE — Consult Note (Signed)
[facility] Consult   Reason for Consult:  Medication management for bipolar disorder Referring Physician:  Dr. Darrick Meigs Patient Identification: [name] MRN:  [id number] Principal Diagnosis: Acute encephalopathy Diagnosis:   Patient Active Problem List   Diagnosis Date Noted  . Acute encephalopathy [G93.40] 03/16/2017  . Acute lower UTI [N39.0]   . Hypokalemia [E87.6] 02/18/2017  . Gait abnormality [R26.9] 01/06/2017  . Bipolar I disorder, most recent episode depressed ([place]) [F31.30] 12/08/2016  . Adjustment disorder with anxiety [F43.22] 12/06/2016  . Delirium due to another medical condition [F05] 12/03/2016  . Confusion [R41.0] 12/03/2016  . Diarrhea [R19.7] 12/03/2016  . Metastatic breast cancer ([place]) [C50.919]   . Cerumen impaction [H61.20] 11/25/2016  . Otitis media [H66.90] 11/06/2016  . Brain metastasis ([place]) [C79.31] 07/27/2016  . Iron deficiency anemia [D50.9] 06/26/2016  . Bone metastases ([place]) [C79.51] 06/03/2016  . Primary cancer of lower-inner quadrant of left female breast ([place]) [C50.312] 06/01/2016  . Pap smear for cervical cancer screening [Z12.4] 03/28/2015  . Current smoker [F17.200] 03/28/2015  . Healthcare maintenance [Z00.00] 03/28/2015  . Seasonal allergies [J30.2] 03/28/2015  . Anxiety state [F41.1] 02/28/2015  . Fibromyalgia [M79.7] 02/28/2015  . Family history of diabetes mellitus [Z83.3] 02/28/2015  . H/O alcohol abuse [Z87.898]     Total Time spent with patient: 1 hour  Subjective:   [name] is a 56 y.o. female patient admitted with confusion and hallucination  HPI:  [name] is a 56 years old female seen, chart reviewed for this face-to-face psychiatry consultation and evaluation of medication management for bipolar disorder. Patient reported she continued to be emotional, dysphoric and anxious and also continued to have intermittent confusion. Patient continued to show more swings and irritability has for staff members  report. Patient was previously treated with lithium which caused excessive fine and gross motor shakes both in hands and legs also presented with history of fall at home while confused and has altered mental status. Patient showed some improvement after reducing her lithium but continued to have confusion and constricted incapacitated to make her own medical decisions and living arrangements at this time. Patient has been living by herself and has a 2 roommates and has no family contact during this hospitalization. Patient may be better served with the discontinuation of the lithium due to increased confusion and unable to tolerate and we will start a new medication Trileptal 300 mg twice daily and increase her lamotrigine to 200 mg daily for controlling her mood swings and irritability and depression. As per the physical therapy patient meets criteria for skilled nursing facility with rehabilitation as she was not able to care for herself at this time.  Past Psychiatric History:  Bipolar depression, Anxiety disorder, Delirium  03/26/2017 Interval history: patient appeared sitting in a chair next to her bed. She continue to be emotional and worried about going to places and not able to care for herself. She has no mania, or psychosis. Will titrate her trileptal which is able to tolerate and slowly responding. She has no suicide or homicide ideations.   Risk to Self: Suicidal Ideation: No (DENIES) Suicidal Intent: No Is patient at risk for suicide?: No Suicidal Plan?: No Access to Means: No (UNRESPONSIVE- PREVIOUSLY REPORTED NO ACCESS) What has been your use of drugs/alcohol within the last 12 months?:  (DENIES USE ALTHOUGH HX OF POLYSUBSTANCE DEPENDENCE) How many times?:  (0) Other Self Harm Risks:  (NONE REPORTED) Triggers for Past Attempts: None known Intentional Self Injurious Behavior: None Risk to  Others: Homicidal Ideation: No (DENIES) Thoughts of Harm to Others: No (DENIES) Current  Homicidal Intent: No Current Homicidal Plan: No Access to Homicidal Means: No Identified Victim:  (NA) History of harm to others?: No (NONE REPORTED) Assessment of Violence: None Noted Violent Behavior Description:  (NA) Does patient have access to weapons?: No Criminal Charges Pending?: No Does patient have a court date: No Prior Inpatient Therapy: Prior Inpatient Therapy: Yes Prior Therapy Dates:  (2017, 2018) Prior Therapy Facilty/Provider(s):  [facility]) Reason for Treatment:  (PER PT RECORD, BIPOLAR D/O) Prior Outpatient Therapy: Prior Outpatient Therapy: Yes Prior Therapy Dates:  (2017 PER PT RECORD) Prior Therapy Facilty/Provider(s):  ([facility] PER PT RECORD) Reason for Treatment:  (BIPOLAR D/O PER PT RECORD) Does patient have an ACCT team?: No Does patient have Intensive In-House Services?  : No Does patient have [facility] services? : No Does patient have P4CC services?: No  Past Medical History:  Past Medical History:  Diagnosis Date  . Alcohol abuse   . Anemia    during chemo  . Anxiety    At age 54  . Arthritis Dx 2010  . Bipolar disorder ([place])   . Cancer ([place])    breast mets to brain  . Chronic pain   . Complication of anesthesia   . Depression   . Fibromyalgia Dx 2005  . GERD (gastroesophageal reflux disease)   . Headache    hx  migraines  . Opiate dependence ([place])   . PONV (postoperative nausea and vomiting)   . Port-a-cath in place   . PTSD (post-traumatic stress disorder)     Past Surgical History:  Procedure Laterality Date  . APPLICATION OF CRANIAL NAVIGATION N/A 08/14/2016   Procedure: APPLICATION OF CRANIAL NAVIGATION;  Surgeon: Erline Levine, MD;  Location: [place] [facility];  Service: Neurosurgery;  Laterality: N/A;  . BREAST RECONSTRUCTION Left    with silicone implant  . CRANIOTOMY N/A 08/14/2016   Procedure: CRANIOTOMY TUMOR EXCISION WITH Lucky Rathke;  Surgeon: Erline Levine, MD;  Location: [place] [facility];  Service: Neurosurgery;  Laterality: N/A;   . FIBULA FRACTURE SURGERY Left   . MASTECTOMY Left   . RADIOLOGY WITH ANESTHESIA N/A 07/23/2016   Procedure: MRI OF BRAIN WITH AND WITHOUT;  Surgeon: Medication Radiologist, MD;  Location: [place];  Service: Radiology;  Laterality: N/A;  . RADIOLOGY WITH ANESTHESIA N/A 09/08/2016   Procedure: MRI OF BRAIN WITH AND WITHOUT CONTRAST;  Surgeon: Medication Radiologist, MD;  Location: [place];  Service: Radiology;  Laterality: N/A;  . RADIOLOGY WITH ANESTHESIA N/A 12/10/2016   Procedure: MRI OF BRAIN WITH AND WITHOUT;  Surgeon: Medication Radiologist, MD;  Location: [facility];  Service: Radiology;  Laterality: N/A;  . RADIOLOGY WITH ANESTHESIA N/A 03/02/2017   Procedure: MRI of BRAIN W and W/OUT CONTRAST;  Surgeon: Medication Radiologist, MD;  Location: [place];  Service: Radiology;  Laterality: N/A;  . right power port placement Right    Family History:  Family History  Problem Relation Age of Onset  . Diabetes Mother   . Bipolar disorder Mother   . CAD Father    Family Psychiatric  History: Noncontributory Social History:  History  Alcohol Use No    Comment: no ETOH since 08/22/12     History  Drug Use No    Comment: prescription opiates from the street daily until Sunday    Social History   Social History  . Marital status: Single    Spouse name: N/A  . Number of children:  N/A  . Years of education: N/A   Social History Main Topics  . Smoking status: Current Every Day Smoker    Packs/day: 0.25    Types: Cigarettes  . Smokeless tobacco: Never Used     Comment: Pt is on Chantix at present time  . Alcohol use No     Comment: no ETOH since 08/22/12  . Drug use: No     Comment: prescription opiates from the street daily until Sunday  . Sexual activity: No     Comment: ablation   Other Topics Concern  . None   Social History Narrative  . None   Additional Social History:    Allergies:   Allergies  Allergen Reactions  . Demerol Itching and Nausea And Vomiting  . Erythromycin  Rash    Labs:  Results for orders placed or performed during the hospital encounter of 03/16/17 (from the past 48 hour(s))  Comprehensive metabolic panel     Status: Abnormal   Collection Time: 03/24/17 11:32 AM  Result Value Ref Range   Sodium 139 135 - 145 mmol/L   Potassium 3.9 3.5 - 5.1 mmol/L   Chloride 105 101 - 111 mmol/L   CO2 27 22 - 32 mmol/L   Glucose, Bld 108 (H) 65 - 99 mg/dL   BUN 6 6 - 20 mg/dL   Creatinine, Ser 0.81 0.44 - 1.00 mg/dL   Calcium 9.4 8.9 - 10.3 mg/dL   Total Protein 5.5 (L) 6.5 - 8.1 g/dL   Albumin 3.5 3.5 - 5.0 g/dL   AST 16 15 - 41 U/L   ALT 12 (L) 14 - 54 U/L   Alkaline Phosphatase 37 (L) 38 - 126 U/L   Total Bilirubin 0.4 0.3 - 1.2 mg/dL   GFR calc non Af Amer >60 >60 mL/min   GFR calc Af Amer >60 >60 mL/min    Comment: (NOTE) The eGFR has been calculated using the CKD EPI equation. This calculation has not been validated in all clinical situations. eGFR's persistently <60 mL/min signify possible Chronic Kidney Disease.    Anion gap 7 5 - 15  CBC with Differential/Platelet     Status: Abnormal   Collection Time: 03/24/17 11:32 AM  Result Value Ref Range   WBC 3.4 (L) 4.0 - 10.5 K/uL   RBC 3.83 (L) 3.87 - 5.11 MIL/uL   Hemoglobin 11.8 (L) 12.0 - 15.0 g/dL   HCT 36.7 36.0 - 46.0 %   MCV 95.8 78.0 - 100.0 fL   MCH 30.8 26.0 - 34.0 pg   MCHC 32.2 30.0 - 36.0 g/dL   RDW 12.9 11.5 - 15.5 %   Platelets 190 150 - 400 K/uL   Neutrophils Relative % 65 %   Neutro Abs 2.2 1.7 - 7.7 K/uL   Lymphocytes Relative 24 %   Lymphs Abs 0.8 0.7 - 4.0 K/uL   Monocytes Relative 8 %   Monocytes Absolute 0.3 0.1 - 1.0 K/uL   Eosinophils Relative 3 %   Eosinophils Absolute 0.1 0.0 - 0.7 K/uL   Basophils Relative 0 %   Basophils Absolute 0.0 0.0 - 0.1 K/uL  Glucose, capillary     Status: Abnormal   Collection Time: 03/25/17  8:07 AM  Result Value Ref Range   Glucose-Capillary 106 (H) 65 - 99 mg/dL  CBC with Differential     Status: Abnormal   Collection  Time: 03/26/17  4:36 AM  Result Value Ref Range   WBC 3.8 (L) 4.0 - 10.5 K/uL  RBC 3.71 (L) 3.87 - 5.11 MIL/uL   Hemoglobin 11.4 (L) 12.0 - 15.0 g/dL   HCT 35.1 (L) 36.0 - 46.0 %   MCV 94.6 78.0 - 100.0 fL   MCH 30.7 26.0 - 34.0 pg   MCHC 32.5 30.0 - 36.0 g/dL   RDW 12.8 11.5 - 15.5 %   Platelets 179 150 - 400 K/uL   Neutrophils Relative % 58 %   Neutro Abs 2.2 1.7 - 7.7 K/uL   Lymphocytes Relative 29 %   Lymphs Abs 1.1 0.7 - 4.0 K/uL   Monocytes Relative 10 %   Monocytes Absolute 0.4 0.1 - 1.0 K/uL   Eosinophils Relative 3 %   Eosinophils Absolute 0.1 0.0 - 0.7 K/uL   Basophils Relative 0 %   Basophils Absolute 0.0 0.0 - 0.1 K/uL  Comprehensive metabolic panel     Status: Abnormal   Collection Time: 03/26/17  4:36 AM  Result Value Ref Range   Sodium 141 135 - 145 mmol/L   Potassium 3.4 (L) 3.5 - 5.1 mmol/L   Chloride 108 101 - 111 mmol/L   CO2 29 22 - 32 mmol/L   Glucose, Bld 101 (H) 65 - 99 mg/dL   BUN 6 6 - 20 mg/dL   Creatinine, Ser 0.68 0.44 - 1.00 mg/dL   Calcium 9.1 8.9 - 10.3 mg/dL   Total Protein 5.2 (L) 6.5 - 8.1 g/dL   Albumin 3.4 (L) 3.5 - 5.0 g/dL   AST 14 (L) 15 - 41 U/L   ALT 12 (L) 14 - 54 U/L   Alkaline Phosphatase 33 (L) 38 - 126 U/L   Total Bilirubin 0.2 (L) 0.3 - 1.2 mg/dL   GFR calc non Af Amer >60 >60 mL/min   GFR calc Af Amer >60 >60 mL/min    Comment: (NOTE) The eGFR has been calculated using the CKD EPI equation. This calculation has not been validated in all clinical situations. eGFR's persistently <60 mL/min signify possible Chronic Kidney Disease.    Anion gap 4 (L) 5 - 15  Glucose, capillary     Status: Abnormal   Collection Time: 03/26/17  7:50 AM  Result Value Ref Range   Glucose-Capillary 101 (H) 65 - 99 mg/dL    Current Facility-Administered Medications  Medication Dose Route Frequency Provider Last Rate Last Dose  . 0.9 %  sodium chloride infusion  250 mL Intravenous PRN Ilene Qua Opyd, MD      . 0.9 %  sodium chloride  infusion   Intravenous Once Chauncey Cruel, MD      . 0.9 %  sodium chloride infusion   Intravenous Once PRN Chauncey Cruel, MD      . albuterol (PROVENTIL) (2.5 MG/3ML) 0.083% nebulizer solution 2.5 mg  2.5 mg Nebulization Once PRN Chauncey Cruel, MD      . anastrozole (ARIMIDEX) tablet 1 mg  1 mg Oral Daily Vianne Bulls, MD   1 mg at 03/26/17 8341  . diphenhydrAMINE (BENADRYL) capsule 25 mg  25 mg Oral Once Chauncey Cruel, MD      . diphenhydrAMINE (BENADRYL) injection 25 mg  25 mg Intravenous Once PRN Chauncey Cruel, MD      . diphenhydrAMINE (BENADRYL) injection 50 mg  50 mg Intravenous Once PRN Chauncey Cruel, MD      . enoxaparin (LOVENOX) injection 40 mg  40 mg Subcutaneous Q24H Vianne Bulls, MD   40 mg at 03/26/17 0920  . EPINEPHrine (ADRENALIN) 0.5  mg  0.5 mg Subcutaneous Once PRN Chauncey Cruel, MD      . EPINEPHrine (ADRENALIN) 0.5 mg  0.5 mg Subcutaneous Once PRN Chauncey Cruel, MD      . EPINEPHrine (ADRENALIN) 1 MG/10ML injection 0.25 mg  0.25 mg Intravenous Once PRN Chauncey Cruel, MD      . EPINEPHrine (ADRENALIN) 1 MG/10ML injection 0.25 mg  0.25 mg Intravenous Once PRN Chauncey Cruel, MD      . famotidine (PEPCID) IVPB 20 mg premix  20 mg Intravenous Once PRN Chauncey Cruel, MD      . feeding supplement (ENSURE ENLIVE) (ENSURE ENLIVE) liquid 237 mL  237 mL Oral TID BM Oswald Hillock, MD   237 mL at 03/26/17 0925  . gabapentin (NEURONTIN) capsule 600 mg  600 mg Oral BID Oswald Hillock, MD   600 mg at 03/26/17 0921  . ibuprofen (ADVIL,MOTRIN) tablet 800 mg  800 mg Oral Q6H PRN Vianne Bulls, MD   800 mg at 03/24/17 0630  . lamoTRIgine (LAMICTAL) tablet 200 mg  200 mg Oral Daily Ambrose Finland, MD   200 mg at 03/26/17 0923  . lidocaine-prilocaine (EMLA) cream 1 application  1 application Topical PRN Ilene Qua Opyd, MD      . loratadine (CLARITIN) tablet 10 mg  10 mg Oral Daily Vianne Bulls, MD   10 mg at 03/26/17 6415  . LORazepam  (ATIVAN) tablet 1 mg  1 mg Oral Q4H PRN Nita Sells, MD      . magic mouthwash  5 mL Oral QID PRN Vianne Bulls, MD      . methylPREDNISolone sodium succinate (SOLU-MEDROL) 125 mg/2 mL injection 125 mg  125 mg Intravenous Once PRN Chauncey Cruel, MD      . metoCLOPramide (REGLAN) injection 10 mg  10 mg Intravenous Q8H PRN Nita Sells, MD   10 mg at 03/25/17 1626  . Oxcarbazepine (TRILEPTAL) tablet 300 mg  300 mg Oral BID Ambrose Finland, MD   300 mg at 03/26/17 0923  . pantoprazole (PROTONIX) EC tablet 40 mg  40 mg Oral Daily Vianne Bulls, MD   40 mg at 03/26/17 8309  . potassium chloride SA (K-DUR,KLOR-CON) CR tablet 10 mEq  10 mEq Oral Daily Vianne Bulls, MD   10 mEq at 03/26/17 0921  . QUEtiapine (SEROQUEL) tablet 50 mg  50 mg Oral QHS Ambrose Finland, MD   50 mg at 03/25/17 2026  . sodium chloride flush (NS) 0.9 % injection 10-40 mL  10-40 mL Intracatheter Q12H Oswald Hillock, MD   10 mL at 03/26/17 0447  . sodium chloride flush (NS) 0.9 % injection 3 mL  3 mL Intravenous Q12H Vianne Bulls, MD   3 mL at 03/25/17 2035  . sodium chloride flush (NS) 0.9 % injection 3 mL  3 mL Intravenous PRN Vianne Bulls, MD      . traMADol (ULTRAM) tablet 50 mg  50 mg Oral Q12H PRN Vianne Bulls, MD   50 mg at 03/25/17 0956  . trastuzumab (HERCEPTIN) 300 mg in sodium chloride 0.9 % 250 mL chemo infusion  300 mg Intravenous Once Chauncey Cruel, MD      . traZODone (DESYREL) tablet 200 mg  200 mg Oral QHS Vianne Bulls, MD   200 mg at 03/25/17 2026    Musculoskeletal: Strength & Muscle Tone: decreased Gait & Station: unsteady Patient leans: N/A  Psychiatric Specialty Exam: Physical Exam  Psychiatric: Judgment and thought content normal. Her mood appears anxious. Her speech is slurred. She is slowed. Cognition and memory are impaired.   as per history and physical   ROS generalized weakness, shaking both upper arms and right shoulder and questionable lithium  toxicity even though this seems to be therapeutic range. Patient continued to be confused and not able to answer most of the questions.  No Fever-chills, No Headache, No changes with Vision or hearing, reports vertigo No problems swallowing food or Liquids, No Chest pain, Cough or Shortness of Breath, No Abdominal pain, No Nausea or Vommitting, Bowel movements are regular, No Blood in stool or Urine, No dysuria, No new skin rashes or bruises, No new joints pains-aches,  No new weakness, tingling, numbness in any extremity, No recent weight gain or loss, No polyuria, polydypsia or polyphagia,  A full 10 point Review of Systems was done, except as stated above, all other Review of Systems were negative.   Blood pressure (!) 104/58, pulse 88, temperature 97.7 F (36.5 C), temperature source Oral, resp. rate 16, height 5' 5"  (1.651 m), weight 56.2 kg (124 lb), SpO2 100 %.Body mass index is 20.63 kg/m.  General Appearance: Casual  Eye Contact:  Fair  Speech:  Pressured, Slow and Slurred  Volume:  Decreased  Mood:  Anxious, depressed and dysphoric   Affect:  Constricted  Thought Process:  Disorganized  Orientation:  Other:  Oriented to person and place only  Thought Content:  Illogical, Rumination and Tangential  Suicidal Thoughts:  No  Homicidal Thoughts:  No  Memory:  Immediate;   Fair Recent;   Poor Remote;   Fair  Judgement:  Impaired  Insight:  Shallow  Psychomotor Activity:  Decreased and Restlessness  Concentration:  Concentration: Fair and Attention Span: Poor  Recall:  [employer] of Knowledge:  Fair  Language:  Good  Akathisia:  Negative  Handed:  Right  AIMS (if indicated):     Assets:  Desire for Improvement Housing Leisure Time Resilience  ADL's:  Impaired  Cognition:  Impaired,  Moderate  Sleep:        Treatment Plan Summary:  56 years old female with bipolar disorder and Anxiety who was admitted due to confusion, disorientation, hallucination and  bilateral hand tremors.   Patient continue to report fine tremors and moments of confusion.  Patient does not have capacity to make medical decision as she does not fully understand the extent of her current mental and medical conditions due to recurrent disorientation and confusions.   Plan/Recommendations: Discontinue Lithium as she can not stop tremors and shakes Increase Trileptal 450 mg BID for bipolar disorder starting today. Continue Lamotrigine 200 mg PO QD for mood stabilization  Consider social worker evaluation for placement in Nursing home facility with rehab prior to be on her own with ADLs' Refer patient for outpatient psychiatric follow up upon discharge.  Disposition:  Patient will be better served with the appropriate medication management and placing skilled nursing facility with rehabilitation Patient does not meet criteria for psychiatric inpatient admission. Supportive therapy provided about ongoing stressors.   Ambrose Finland, MD 03/26/2017 10:25 AM

## 2017-03-26 NOTE — Progress Notes (Signed)
Physical Therapy Treatment Patient Details Name: [name] Today's Date: 03/26/2017    History of Present Illness Pt is a 56 y/o female presented to the ED with confusion and hallucinations. Of note, pt recently d/c'd from [facility] on 03/01/17 for similar issues. 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    PT Comments    Pt continues to be making steady progress toward goals with improved mobility and decreased reliance on clinician for support with gait activities. Pt is able to perform gait without RW and demonstrates significant improvement in balance with mobility.     Follow Up Recommendations  SNF;Supervision/Assistance - 24 hour     Equipment Recommendations  None recommended by PT    Recommendations for Other Services       Precautions / Restrictions Precautions Precautions: Fall Precaution Comments: impulsive Restrictions Weight Bearing Restrictions: No    Mobility  Bed Mobility               General bed mobility comments: Sitting in recliner when PT arrives  Transfers Overall transfer level: Needs assistance Equipment used: None Transfers: Sit to/from Stand Sit to Stand: Supervision         General transfer comment: Supervision for safety. significantly improved balance.   Ambulation/Gait Ambulation/Gait assistance: Supervision;Min guard   Assistive device: Rolling walker (2 wheeled) Gait Pattern/deviations: Step-through pattern;Decreased step length - right;Decreased step length - left;Decreased stride length;Drifts right/left Gait velocity: decreased Gait velocity interpretation: Below normal speed for age/gender General Gait Details: improved safety with gait, improved sequencing and decreased ataxia noted this session. Performed increased distance.    Stairs            Wheelchair Mobility    Modified Rankin (Stroke Patients Only)        Balance Overall balance assessment: Needs assistance Sitting-balance support: No upper extremity supported;Feet supported Sitting balance-Leahy Scale: Good     Standing balance support: No upper extremity supported;During functional activity Standing balance-Leahy Scale: Fair Standing balance comment: min guard to supervision for safety                            Cognition Arousal/Alertness: Awake/alert Behavior During Therapy: Impulsive Overall Cognitive Status: No family/caregiver present to determine baseline cognitive functioning Area of Impairment: Safety/judgement;Memory                     Memory: Decreased short-term memory   Safety/Judgement: Decreased awareness of safety;Decreased awareness of deficits     General Comments: continues to improve cognitively. Remembers that MD was in and that she has a procedure. Still impulsive. Improved adherence to safety this session.       Exercises      General Comments        Pertinent Vitals/Pain Pain Assessment: No/denies pain    Home Living                      Prior Function            PT Goals (current goals can now be found in the care plan section) Acute Rehab PT Goals Patient Stated Goal: to get out of the hospital Progress towards PT goals: Progressing toward goals    Frequency    Min 3X/week      PT Plan Current plan remains appropriate    Co-evaluation  End of Session Equipment Utilized During Treatment: Gait belt Activity Tolerance: Patient tolerated treatment well Patient left: in chair;with call bell/phone within reach;with chair alarm set Nurse Communication: Mobility status PT Visit Diagnosis: Unsteadiness on feet (R26.81);Other abnormalities of gait and mobility (R26.89);Muscle weakness (generalized) (M62.81)     Time: 3729-0211 PT Time Calculation (min) (ACUTE ONLY): 14 min  Charges:  $Gait Training: 8-22 mins                    G  Codes:      Scheryl Marten PT, DPT  [phone]    Jacqulyn Liner Sloan Leiter 03/26/2017, 11:29 AM

## 2017-03-26 NOTE — Progress Notes (Signed)
   03/26/17 1600  Clinical Encounter Type  Visited With Patient  Visit Type Follow-up;Spiritual support  Referral From Chaplain;Nurse  Consult/Referral To Chaplain  Recommendations (continued follow up/palliative consult)  Spiritual Encounters  Spiritual Needs Prayer;Ritual  Stress Factors  Patient Stress Factors Health changes;Lack of caregivers    Chaplain followed up with patient on 6E. Patient seemed confused and repeatedly stated her desire to go home. I offered prayer and provided ministry of presence. Patient was unable to track with simple questions and prompts. Not sure that patient will be able to articulate needs or self-advocate. Would benefit from a palliative care consult. Minna Dumire L. Volanda Napoleon, MDiv

## 2017-03-26 NOTE — Progress Notes (Signed)
[place] Clinical Social Work  Clinical Social Work received follow up call from [employer] from [employer].  She spoke with SSA and was told pt had received royalties in the past and SSA was requesting documentation of said royalties in order to move forward with application. Clinical Social Worker contacted son, [name] via phone and [place] CSW introduced self, explained role of CSW/Pt and Family Support Team at [facility]. CSW updated him on reason for delay in processing SSD/SSI application. [name] was aware of old royalties, but reports pt has not received for many years. [name] plans to contact other family members in attempt to locate needed paperwork and submit to [facility]. CSW also provided him with [facility] contact information if he had questions. [name] aware inpt CSW assisting with placement and this CSW available at [facility] for other needs. CSW to follow.    Clinical Social Work interventions:  Resource coordination  Loren Racer, [employer], OSW-C Clinical Social Worker [place]  [place] Phone: [phone] Fax: [phone]

## 2017-03-26 NOTE — Progress Notes (Signed)
[name]   DOB:October 06, 1961   DJ#:[id number]   QQI#:[id number]  Subjective:  "I want to go home." Patient was tearful this AM. Room is dark. She has been in bed every time I have visited (however I do come by early AM). She denies pain, rash, bleeding, or unusual fatigue. Says she is eating better. "I don't know why I'm still here." No family in room   Objective: middle aged White woman examined sitting up in bed Vitals:   03/25/17 2100 03/26/17 0613  BP: 124/75 (!) 105/58  Pulse: 72 76  Resp:  14  Temp: 97.5 F (36.4 C) 98.1 F (36.7 C)    Body mass index is 20.63 kg/m.  Intake/Output Summary (Last 24 hours) at 03/26/17 0816 Last data filed at 03/26/17 0600  Gross per 24 hour  Intake              250 ml  Output              900 ml  Net             -650 ml     Oropharynx shows no thrush or other lesions  No cervical or supraclavicular adenopathy  Lungs no rales or wheezes  Heart regular rate and rhythm  Abdomen soft, +BS  Neuro nonfocal. well-oriented, labile affect  Breast exam: deferred  CBG (last 3)   Recent Labs  03/24/17 0819 03/25/17 0807 03/26/17 0750  GLUCAP 103* 106* 101*     Labs:  Lab Results  Component Value Date   WBC 3.8 (L) 03/26/2017   HGB 11.4 (L) 03/26/2017   HCT 35.1 (L) 03/26/2017   MCV 94.6 03/26/2017   PLT 179 03/26/2017   NEUTROABS 2.2 03/26/2017    _0 @  Urine Studies No results for input(s): UHGB, CRYS in the last 72 hours.  Invalid input(s): UACOL, UAPR, USPG, UPH, UTP, UGL, UKET, UBIL, UNIT, UROB, ULEU, UEPI, UWBC, URBC, UBAC, CAST, [place], [place]  Basic Metabolic Panel:  Recent Labs Lab 03/23/17 0322 03/24/17 1132 03/26/17 0436  NA  --  139 141  K  --  3.9 3.4*  CL  --  105 108  CO2  --  27 29  GLUCOSE  --  108* 101*  BUN  --  6 6  CREATININE 0.83 0.81 0.68  CALCIUM  --  9.4 9.1   GFR Estimated Creatinine Clearance: 70.5 mL/min (by C-G formula based on SCr of 0.68 mg/dL). Liver Function  Tests:  Recent Labs Lab 03/24/17 1132 03/26/17 0436  AST 16 14*  ALT 12* 12*  ALKPHOS 37* 33*  BILITOT 0.4 0.2*  PROT 5.5* 5.2*  ALBUMIN 3.5 3.4*   No results for input(s): LIPASE, AMYLASE in the last 168 hours. No results for input(s): AMMONIA in the last 168 hours. Coagulation profile No results for input(s): INR, PROTIME in the last 168 hours.  CBC:  Recent Labs Lab 03/24/17 1132 03/26/17 0436  WBC 3.4* 3.8*  NEUTROABS 2.2 2.2  HGB 11.8* 11.4*  HCT 36.7 35.1*  MCV 95.8 94.6  PLT 190 179   Cardiac Enzymes: No results for input(s): CKTOTAL, CKMB, CKMBINDEX, TROPONINI in the last 168 hours. BNP: Invalid input(s): POCBNP CBG:  Recent Labs Lab 03/21/17 0749 03/23/17 0855 03/24/17 0819 03/25/17 0807 03/26/17 0750  GLUCAP 90 94 103* 106* 101*   D-Dimer No results for input(s): DDIMER in the last 72 hours. Hgb A1c No results for input(s): HGBA1C in the last 72 hours. Lipid Profile No results for  input(s): CHOL, HDL, LDLCALC, TRIG, CHOLHDL, LDLDIRECT in the last 72 hours. Thyroid function studies No results for input(s): TSH, T4TOTAL, T3FREE, THYROIDAB in the last 72 hours.  Invalid input(s): FREET3 Anemia work up No results for input(s): VITAMINB12, FOLATE, FERRITIN, TIBC, IRON, RETICCTPCT in the last 72 hours. Microbiology Recent Results (from the past 240 hour(s))  Culture, Urine     Status: Abnormal   Collection Time: 03/16/17  7:15 PM  Result Value Ref Range Status   Specimen Description URINE, RANDOM  Final   Special Requests NONE  Final   Culture MULTIPLE SPECIES PRESENT, SUGGEST RECOLLECTION (A)  Final   Report Status 03/18/2017 FINAL  Final      Studies:  Dg Chest 2 View  Result Date: 02/25/2017 CLINICAL DATA:  Altered mental status. 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 There is no focal infiltrate. There is no pleural  effusion. No pulmonary parenchymal nodules or masses are observed. The heart and pulmonary vascularity are normal. The power port catheter tip projects over the midportion of the SVC. The bony thorax exhibits no acute abnormality. IMPRESSION: There is no pneumonia, CHF, nor other acute cardiopulmonary abnormality. Electronically Signed   By: David  Martinique M.D.   On: 02/25/2017 16:17   Ct Head Wo Contrast  Result Date: 03/16/2017 CLINICAL DATA:  Altered mental status EXAM: CT HEAD WITHOUT CONTRAST TECHNIQUE: Contiguous axial images were obtained from the base of the skull through the vertex without intravenous contrast. COMPARISON:  03/02/2017, 02/25/2017, 12/03/2016 FINDINGS: Brain: No acute territorial infarction, hemorrhage, or new mass lesion is seen. Stable left occipital craniotomy changes. Small focus of encephalomalacia in the left cerebellum as before. Stable ventricle size.  No midline shift. Vascular: No hyperdense vessels.  No unexpected calcification. Skull: Left occipital and suboccipital postsurgical changes. No acute or suspicious bone lesion Sinuses/Orbits: Mild mucosal thickening in the sphenoid and ethmoid sinuses. No acute orbital abnormality. Other: Mild soft tissue thickening or swelling over the left supraorbital region IMPRESSION: No definite CT evidence for acute intracranial abnormality ; stable postsurgical changes of the posterior fossa and left occipital bone. Electronically Signed   By: Donavan Foil M.D.   On: 03/16/2017 19:09   Ct Head Wo Contrast  Result Date: 02/25/2017 CLINICAL DATA:  Altered mental status EXAM: CT HEAD WITHOUT CONTRAST TECHNIQUE: Contiguous axial images were obtained from the base of the skull through the vertex without intravenous contrast. COMPARISON:  12/03/2016.  MRI 12/10/2016. FINDINGS: Brain: Postoperative changes in the left occipital region. No acute intracranial abnormality. Specifically, no hemorrhage, hydrocephalus, mass lesion, acute  infarction, or significant intracranial injury. Vascular: No hyperdense vessel or unexpected calcification. Skull: No acute calvarial abnormality. Prior craniectomy and mesh placement in the left occipital region. Sinuses/Orbits: Visualized paranasal sinuses and mastoids clear. Orbital soft tissues unremarkable. Other: None IMPRESSION: Postoperative changes in the left occipital region. No acute intracranial abnormality. Electronically Signed   By: Rolm Baptise M.D.   On: 02/25/2017 16:06   Ct Chest W Contrast  Result Date: 03/18/2017 CLINICAL DATA:  Breast cancer.  Short breath.  Brain metastasis. EXAM: CT CHEST WITH CONTRAST TECHNIQUE: Multidetector CT imaging of the chest was performed during intravenous contrast administration. CONTRAST:  70m ISOVUE-300 IOPAMIDOL (ISOVUE-300) INJECTION 61% COMPARISON:  CT 11/10/2016 FINDINGS: Cardiovascular: Port in RIGHT chest wall with tip the distal SVC. No significant vascular findings. Normal heart size. No pericardial effusion. Mediastinum/Nodes: No axillary  supraclavicular adenopathy. No mediastinal hilar adenopathy Lungs/Pleura: Mild atelectasis lung bases. Mild linear scarring LEFT lung apex. No suspicious nodularity. Pulmonary edema or pulmonary infection. Upper Abdomen: Limited view of the liver, kidneys, pancreas are unremarkable. Normal adrenal glands. Multiple small hepatic hypodensities likely represent benign cysts for not comparison Musculoskeletal: Scatter sclerotic lesions in the spine for stable. Post LEFT mastectomy with breast prosthetic. IMPRESSION: 1. No evidence of breast cancer progression. 2. No acute pulmonary parenchymal findings. 3. Stable sclerotic lesions in the thoracic spine. Electronically Signed   By: Suzy Bouchard M.D.   On: 03/18/2017 12:14   Mr Jeri Cos DZ Contrast  Result Date: 03/02/2017 CLINICAL DATA:  Followup metastatic disease to the brain. Several day history of confusion. EXAM: MRI HEAD WITHOUT AND WITH CONTRAST TECHNIQUE:  Multiplanar, multiecho pulse sequences of the brain and surrounding structures were obtained without and with intravenous contrast. CONTRAST:  57m MULTIHANCE GADOBENATE DIMEGLUMINE 529 MG/ML IV SOLN COMPARISON:  CT 02/25/2017.  MRI 12/10/2016. FINDINGS: Brain: Diffusion imaging does not show any acute or subacute infarction or other cause of restricted diffusion. There has been previous left occipital craniotomy for tumor section. No evidence of residual or recurrent disease. Postoperative enhancement continues to diminish. No newly seen metastatic lesion elsewhere. No hemorrhage, hydrocephalus or extra-axial collection. Minimal small-vessel or radiation changes of the white matter as seen previously. Vascular: Major vessels at the base of the brain show flow. Skull and upper cervical spine: Negative Sinuses/Orbits: Clear/normal Other: None IMPRESSION: No worsening or new finding. No explanation for confusion. Previous resection of a left cerebellar metastasis. No evidence of residual or recurrent disease in that location. Diminishing postoperative enhancement. Electronically Signed   By: MNelson ChimesM.D.   On: 03/02/2017 16:31   Dg Chest Port 1 View  Result Date: 03/16/2017 CLINICAL DATA:  Altered mental status. Metastatic left breast cancer. EXAM: PORTABLE CHEST 1 VIEW COMPARISON:  02/25/2017 chest radiograph. FINDINGS: Right subclavian MediPort terminates at the cavoatrial junction. Stable cardiomediastinal silhouette with normal heart size. No pneumothorax. No pleural effusion. Lungs appear clear, with no acute consolidative airspace disease and no pulmonary edema. Asymmetric haziness overlying the left lower lung is due to overlying left breast prosthesis. IMPRESSION: No active cardiopulmonary disease. Electronically Signed   By: JIlona SorrelM.D.   On: 03/16/2017 16:31     Assessment: 56y.o. 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  (2) status post left mastectomy and axillary lymph node dissection with immediate expander placement 07/18/2015 for an mpT4 pN2,stage IIIB invasive ductal carcinoma, grade 3, with negative margins. (a) definitive implant exchange to be scheduled in December  METASTATIC DISEASE: October 2016  (3) CT scan of the chest abdomen and pelvis 09/16/2015 shows metastatic lesions in the right scapula, left iliac crest, L4, and T spine. There were questionable liver cysts, with repeat CT scan 03/02/2016 showing possible right upper lobe lung lesions and possibly increased liver lesions (a) CT scan of the chest 06/17/2016 shows no active disease in the lungs or liver (b) Bone scan July 2017 showed no evidence of bony metastatic disease  (c) head CT 07/08/2016 showed a cerebellar lesion, confirmed by MRI 07/23/2016, status post craniotomy 08/14/2016, confirming a metastatic deposit which was estrogen and progesterone receptor negative, HER-2 amplified  with a signals ratio of 7.16, number per cell 13.25 (d) CA 27-29 not informative  (4) received docetaxel every 3 weeks 6 together with trastuzumab and pertuzumab, last docetaxel dose 02/11/2016  (5) adjuvant radiation7/03/2016 to 06/26/2016 at [place]: 1. The Left chest wall was treated to 23.4 Gy in 13 fractions at 1.8 Gy per fraction. 2. The Left chest wall was boosted to 10 Gy in 5 fractions at 2 Gy per fraction. 3. The Left Sclav/PAB was treated to 23.4 Gy in 13 fractions at 1.8 Gy per fraction.  Including the patient's treatment in [place] (received 15 fractions per Dr. Maryan Rued near [place], [place]), the patient received 50.4 Gy to the left chest wall  and supraclavicular region.  (6) started trastuzumab and pertuzumabOctober 2016, continuing every 3 weeks, (a) echocardiogram 02/26/2016 showed a well preserved ejection fraction (b) echocardiogram 07/01/2016 shows an ejection fraction in the 60-65%  (c) pertuzumab discontinued 08/25/2016 with uncontrolled diarrhea (d) echocardiogram 11/11/2016 showed an ejection fraction in the 60-65%             (e) echocardiogram 03/03/2017 shows and EF of 60-65%  (7) started denosumab/XgevaOctober 2017, repeated every 6 weeks  (8) started anastrozole October 2017  (a) bone scan 11/10/2016 shows no active disease (b) chest CT scan 11/10/2016 stable, with no evidence of active disease  (9) history of bipolar disorder (a) lithium level checked every 6 weeks  (10) mild anemia with a significant drop in the MCV, ferritin 10 06/03/2016,  (a) Feraheme given 06/12/2016 and 06/18/2016  (11) tobacco abuse: Chantix started 06/18/2016  (12) 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 (a) brain irradiation10/19/17-11/08/17:Whole brain/ 35 Gy in 14 fractions (b) repeat brain MRI obtained 12/10/2016 shows no active disease in the brain (c) repeat brain MRI under sedation 03/02/2017 shows NAD   Plan:  I appreciate everyone's effort to get Keshawn treated today so her very effective therapy does not have to be interrupted. I have examined her and reviewed her labs and echo. Orders are in place and we are waiting to get written consent from the patient's son [name]-- we should be able to fax that to the floor by 9 AM or so. Her treatment is tentatively scheduled for 10 AM  [place] is eager for discharge "home." At this point she is agreeable to SNF. I reviewed her recent history and went over her confusion and readmissions. I am hopeful  after some time in a structured setting she may be able to return to some semblance of normalcy. However I still do not know who will be following her from a psychiatric point of view as outpatient. It would be useful if her had a name and date for a counseling/psychiatric visit after discharge.  Assuming she gets treated this AM, next dose will be in 21 days in my office. I will schedule that for her.  Again, a big thanks to the team for their help to this patient!     Chauncey Cruel, MD 03/26/2017  8:16 AM Medical Oncology and Hematology [facility] [address] [place], [place] [address] Tel. [phone]    Fax. [phone]

## 2017-03-26 NOTE — Progress Notes (Signed)
[facility]  PROGRESS NOTE  [name] WLS:[id number] DOB: July 02, 1961 DOA: 03/16/2017 PCP: Arnoldo Morale, MD   Brief HPI:    56 year old female with history of alcohol abuse, bipolar disorder, left breast cancer with brain metastasis status post mastectomy and craniectomy with radiation to brain and breast came to ED with confusion and hallucinations. Patient started on IV Rocephin for acute cystitis.Patient has been seen by psychiatry and deemed not having decision-making capacity for her medical and psychiatric condition.   Subjective   Fair Wishes to shower  Asking if can go off the floor   Assessment/Plan:     1. Acute encephalopathy- intermittent episodes of confusion and hallucinations-since patient had suboccipital craniectomy for resection of posterior fossa metastasis September 2017 requiring multiple hospitalizations. Patient was  started on Rocephin for possible UTI, urine culture  grew multiple species. MRI brain earlier this month was normal CT head was negative during this admission. Rocephin was discontinued. Patient was seen by psychiatry and deemed to have no decision-making capacity. Still having intermittent episodes of confusion.  Safety sitter at bedside--awaiting placement 2. Abnormal UA-  patient had abnormal UA, started on IV Rocephin empirically . Urine culture grew multiple species. Rocephin was discontinued.No new changes. 3. Bipolar disorder- patient was seen by psychiatry and dose of lithium changed to  450 mg by mouth daily at bedtime. Psych has seen the patient and deemed not having decision-making capacity. Recheck lithium level is 0.37, which is lower than the therapeutic range of 0.60-  1.20. Discussed with psychiatry, they will adjust the dose of lithium or consider stopping  it before discharge.  On Trileptal 300 bid-->450 bid and Gabapentin 600 bid, lamictal 200 qd, seroquel 50 hs, trazodone 200 hs and ativan 1 mg q4 prn--needOP Psych follow  up 4. Tremors-resolved, likely from lithium, dose of lithium d/c as per psych 5. History of breast cancer with brain metastasis- patient is status post mastectomy and axillary node dissection, suboccipital craniectomy for resection of posterior fossa metastasis, radiation to brain and breast. Continue Arimidex. Patient was seen by Dr. [name] in the hospital-currently Herceptin o be given    DVT prophylaxis: Lovenox  Code Status: Full code  Family Communication: No family present at bedside-d/w son on phone  Disposition Plan: Skilled nursing facility when can get placement  Antibiotics:   Anti-infectives    Start     Dose/Rate Route Frequency Ordered Stop   03/17/17 2000  cefTRIAXone (ROCEPHIN) 1 g in dextrose 5 % 50 mL IVPB  Status:  Discontinued     1 g 100 mL/hr over 30 Minutes Intravenous Every 24 hours 03/16/17 2043 03/18/17 1559   03/16/17 2015  cefTRIAXone (ROCEPHIN) 1 g in dextrose 5 % 50 mL IVPB     1 g 100 mL/hr over 30 Minutes Intravenous  Once 03/16/17 2000 03/16/17 2041       Objective   Vitals:   03/18/17 1031 03/18/17 1711 03/19/17 0433 03/19/17 0809  BP: (!) 106/41 116/65 (!) 73/39 (!) 104/57  Pulse: 96 87 79 89  Resp: 18 19 18 18   Temp:  97.9 F (36.6 C) 98.3 F (36.8 C) 98.7 F (37.1 C)  TempSrc:  Oral Oral Oral  SpO2: 100% 100% 97% 95%  Weight:   40.8 kg (90 lb)     Intake/Output Summary (Last 24 hours) at 03/19/17 1438 Last data filed at 03/19/17 0935  Gross per 24 hour  Intake              713  ml  Output              801 ml  Net              -88 ml   Filed Weights   03/19/17 0433  Weight: 40.8 kg (90 lb)     Physical Examination:  Physical Exam: Eyes: No icterus, alert  Mouth: Oral mucosa is moist, no lesions  Neck: Supple, no deformities, masses, or tenderness Lungs: Normal respiratory effort, bilateral clear to auscultation, abd soft  Data Reviewed: I have personally reviewed following labs and imaging studies  CBG:  Recent  Labs Lab 03/17/17 0759 03/18/17 1027 03/19/17 0815  GLUCAP 97 128* 105*    CBC:  Recent Labs Lab 03/16/17 1630 03/16/17 2134  WBC 4.5  --   NEUTROABS 3.1  --   HGB 11.5*  --   HCT 36.3 34.3  MCV 96.8  --   PLT 180  --     Basic Metabolic Panel:  Recent Labs Lab 03/16/17 1630 03/17/17 0501  NA 139 141  K 3.9 3.8  CL 105 107  CO2 24 26  GLUCOSE 96 94  BUN 5* 6  CREATININE 0.79 0.71  CALCIUM 9.8 9.4    Recent Results (from the past 240 hour(s))  Culture, Urine     Status: Abnormal   Collection Time: 03/16/17  7:15 PM  Result Value Ref Range Status   Specimen Description URINE, RANDOM  Final   Special Requests NONE  Final   Culture MULTIPLE SPECIES PRESENT, SUGGEST RECOLLECTION (A)  Final   Report Status 03/18/2017 FINAL  Final     Liver Function Tests:  Recent Labs Lab 03/16/17 1630  AST 15  ALT 11*  ALKPHOS 34*  BILITOT 0.5  PROT 5.5*  ALBUMIN 3.8   No results for input(s): LIPASE, AMYLASE in the last 168 hours.  Recent Labs Lab 03/16/17 2100  AMMONIA 16    Studies: Ct Chest W Contrast  Result Date: 03/18/2017 CLINICAL DATA:  Breast cancer.  Short breath.  Brain metastasis. EXAM: CT CHEST WITH CONTRAST TECHNIQUE: Multidetector CT imaging of the chest was performed during intravenous contrast administration. CONTRAST:  47mL ISOVUE-300 IOPAMIDOL (ISOVUE-300) INJECTION 61% COMPARISON:  CT 11/10/2016 FINDINGS: Cardiovascular: Port in RIGHT chest wall with tip the distal SVC. No significant vascular findings. Normal heart size. No pericardial effusion. Mediastinum/Nodes: No axillary supraclavicular adenopathy. No mediastinal hilar adenopathy Lungs/Pleura: Mild atelectasis lung bases. Mild linear scarring LEFT lung apex. No suspicious nodularity. Pulmonary edema or pulmonary infection. Upper Abdomen: Limited view of the liver, kidneys, pancreas are unremarkable. Normal adrenal glands. Multiple small hepatic hypodensities likely represent benign cysts for  not comparison Musculoskeletal: Scatter sclerotic lesions in the spine for stable. Post LEFT mastectomy with breast prosthetic. IMPRESSION: 1. No evidence of breast cancer progression. 2. No acute pulmonary parenchymal findings. 3. Stable sclerotic lesions in the thoracic spine. Electronically Signed   By: Suzy Bouchard M.D.   On: 03/18/2017 12:14    Scheduled Meds: . anastrozole  1 mg Oral Daily  . enoxaparin (LOVENOX) injection  40 mg Subcutaneous Q24H  . gabapentin  600 mg Oral BID  . lamoTRIgine  150 mg Oral Daily  . lithium carbonate  450 mg Oral QHS  . loratadine  10 mg Oral Daily  . pantoprazole  40 mg Oral Daily  . potassium chloride  10 mEq Oral Daily  . QUEtiapine  50 mg Oral QHS  . sodium chloride flush  10-40 mL Intracatheter Q12H  .  sodium chloride flush  3 mL Intravenous Q12H  . traZODone  200 mg Oral QHS      Time spent: 15 min  Verneita Griffes, MD [facility] [phone]   03/19/2017, 2:38 PM  LOS: 2 days

## 2017-03-26 NOTE — Progress Notes (Signed)
OT Cancellation Note  Patient Details Name: [name] MRN: [id number] DOB: 18-Aug-1961   Cancelled Treatment:    Reason Eval/Treat Not Completed: Fatigue/lethargy limiting ability to participate (Pt resting comforably, did not disturb. Will try back.)  Malka So 03/26/2017, 1:38 PM

## 2017-03-26 NOTE — Clinical Social Work Note (Signed)
Patient will be reviewed for PASARR tomorrow by Renee Rival.  Dayton Scrape, [place]

## 2017-03-26 NOTE — Progress Notes (Signed)
[place] Spiritual Care Note  Continuing to follow from a distance, consulting periodically with [name] Hock/LCSW [facility]). Referred to Leonel Ramsay (unit chaplain at [facility]) for spiritual/emotional/logistical support and team collaboration on that campus.   For immediate Spiritual Care needs, please page [facility] chaplain 24/7 at [phone].  For outpatient/ongoing/follow-up needs, please contact me. Thank you!   [place], [place], [facility] Pager [phone] Voicemail [phone] Or Suanne Marker

## 2017-03-26 NOTE — Telephone Encounter (Signed)
Phoned patient's son, [name], back. Explained that per [name], PA-C she was trying to reach him to explain the radiation oncology follow up was not needed for next week. Informed him the appointment has been cancelled. He verbalized understanding and expressed appreciation for the return call.

## 2017-03-27 LAB — BASIC METABOLIC PANEL
Anion gap: 4 — ABNORMAL LOW (ref 5–15)
BUN: 8 mg/dL (ref 6–20)
CO2: 28 mmol/L (ref 22–32)
Calcium: 9 mg/dL (ref 8.9–10.3)
Chloride: 108 mmol/L (ref 101–111)
Creatinine, Ser: 0.69 mg/dL (ref 0.44–1.00)
GFR calc Af Amer: >60 mL/min (ref >60)
GFR calc non Af Amer: >60 mL/min (ref >60)
Glucose, Bld: 101 mg/dL — ABNORMAL HIGH (ref 65–99)
Potassium: 3.7 mmol/L (ref 3.5–5.1)
Sodium: 140 mmol/L (ref 135–145)

## 2017-03-27 LAB — CBC
HCT: 34.4 % — ABNORMAL LOW (ref 36.0–46.0)
Hemoglobin: 11.1 g/dL — ABNORMAL LOW (ref 12.0–15.0)
MCH: 30.6 pg (ref 26.0–34.0)
MCHC: 32.3 g/dL (ref 30.0–36.0)
MCV: 94.8 fL (ref 78.0–100.0)
Platelets: 183 10*3/uL (ref 150–400)
RBC: 3.63 MIL/uL — ABNORMAL LOW (ref 3.87–5.11)
RDW: 12.7 % (ref 11.5–15.5)
WBC: 2.9 10*3/uL — ABNORMAL LOW (ref 4.0–10.5)

## 2017-03-27 LAB — GLUCOSE, CAPILLARY: Glucose-Capillary: 91 mg/dL (ref 65–99)

## 2017-03-27 NOTE — Progress Notes (Signed)
[facility]  PROGRESS NOTE  [name] ZOX:[id number] DOB: July 02, 1961 DOA: 03/16/2017 PCP: Arnoldo Morale, MD   Brief HPI:    56 year old female with history of alcohol abuse, bipolar disorder, left breast cancer with brain metastasis status post mastectomy and craniectomy with radiation to brain and breast came to ED with confusion and hallucinations. Patient started on IV Rocephin for acute cystitis.Patient has been seen by psychiatry and deemed not having decision-making capacity for her medical and psychiatric condition.   Subjective   Fair this am no new issues Less confused when seen eating drinking and has showered asking about home   Assessment/Plan:     1. Acute encephalopathy- intermittent episodes of confusion and hallucinations-since patient had suboccipital craniectomy for resection of posterior fossa metastasis September 2017 requiring multiple hospitalizations. Patient was  started on Rocephin for possible UTI, urine culture  grew multiple species. MRI brain earlier this month was normal CT head was negative during this admission. Rocephin was discontinued. Patient was seen by psychiatry and deemed to have no decision-making capacity. Still having intermittent episodes of confusion.  Safety sitter at bedside--awaiting placement 2. Abnormal UA-  patient had abnormal UA, started on IV Rocephin empirically . Urine culture grew multiple species. Rocephin was discontinued.No new changes. 3. Bipolar disorder- patient was seen by psychiatry and dose of lithium changed to  450 mg by mouth daily at bedtime. Psych has seen the patient and deemed not having decision-making capacity. Recheck lithium level is 0.37, which is lower than the therapeutic range of 0.60-  1.20. Discussed with psychiatry, they will adjust the dose of lithium or consider stopping  it before discharge.  On Trileptal 300 bid-->450 bid and Gabapentin 600 bid, lamictal 200 qd, seroquel 50 hs, trazodone 200 hs and  ativan 1 mg q4 prn--needOP Psych follow up 4. Tremors-resolved, likely from lithium, dose of lithium d/c as per psych 5. History of breast cancer with brain metastasis- patient is status post mastectomy and axillary node dissection, suboccipital craniectomy for resection of posterior fossa metastasis, radiation to brain and breast. Continue Arimidex. Patient was seen by Dr. [name] in the hospital-currently Herceptin o be given    DVT prophylaxis: Lovenox  Code Status: Full code  Family Communication: No family present at bedside-d/w son on phone  Disposition Plan: Skilled nursing facility when can get placement  Antibiotics:   Anti-infectives    Start     Dose/Rate Route Frequency Ordered Stop   03/17/17 2000  cefTRIAXone (ROCEPHIN) 1 g in dextrose 5 % 50 mL IVPB  Status:  Discontinued     1 g 100 mL/hr over 30 Minutes Intravenous Every 24 hours 03/16/17 2043 03/18/17 1559   03/16/17 2015  cefTRIAXone (ROCEPHIN) 1 g in dextrose 5 % 50 mL IVPB     1 g 100 mL/hr over 30 Minutes Intravenous  Once 03/16/17 2000 03/16/17 2041       Objective   Vitals:   03/18/17 1031 03/18/17 1711 03/19/17 0433 03/19/17 0809  BP: (!) 106/41 116/65 (!) 73/39 (!) 104/57  Pulse: 96 87 79 89  Resp: 18 19 18 18   Temp:  97.9 F (36.6 C) 98.3 F (36.8 C) 98.7 F (37.1 C)  TempSrc:  Oral Oral Oral  SpO2: 100% 100% 97% 95%  Weight:   40.8 kg (90 lb)     Intake/Output Summary (Last 24 hours) at 03/19/17 1438 Last data filed at 03/19/17 0935  Gross per 24 hour  Intake  713 ml  Output              801 ml  Net              -88 ml   Filed Weights   03/19/17 0433  Weight: 40.8 kg (90 lb)     Physical Examination:  Physical Exam: Eyes: No icterus, alert  Mouth: Oral mucosa is moist, no lesions  Neck: Supple, no deformities, masses, or tenderness Lungs: Normal respiratory effort, bilateral clear to auscultation, abd soft  Data Reviewed: I have personally reviewed following  labs and imaging studies  CBG:  Recent Labs Lab 03/17/17 0759 03/18/17 1027 03/19/17 0815  GLUCAP 97 128* 105*    CBC:  Recent Labs Lab 03/16/17 1630 03/16/17 2134  WBC 4.5  --   NEUTROABS 3.1  --   HGB 11.5*  --   HCT 36.3 34.3  MCV 96.8  --   PLT 180  --     Basic Metabolic Panel:  Recent Labs Lab 03/16/17 1630 03/17/17 0501  NA 139 141  K 3.9 3.8  CL 105 107  CO2 24 26  GLUCOSE 96 94  BUN 5* 6  CREATININE 0.79 0.71  CALCIUM 9.8 9.4    Recent Results (from the past 240 hour(s))  Culture, Urine     Status: Abnormal   Collection Time: 03/16/17  7:15 PM  Result Value Ref Range Status   Specimen Description URINE, RANDOM  Final   Special Requests NONE  Final   Culture MULTIPLE SPECIES PRESENT, SUGGEST RECOLLECTION (A)  Final   Report Status 03/18/2017 FINAL  Final     Liver Function Tests:  Recent Labs Lab 03/16/17 1630  AST 15  ALT 11*  ALKPHOS 34*  BILITOT 0.5  PROT 5.5*  ALBUMIN 3.8   No results for input(s): LIPASE, AMYLASE in the last 168 hours.  Recent Labs Lab 03/16/17 2100  AMMONIA 16    Studies: Ct Chest W Contrast  Result Date: 03/18/2017 CLINICAL DATA:  Breast cancer.  Short breath.  Brain metastasis. EXAM: CT CHEST WITH CONTRAST TECHNIQUE: Multidetector CT imaging of the chest was performed during intravenous contrast administration. CONTRAST:  48mL ISOVUE-300 IOPAMIDOL (ISOVUE-300) INJECTION 61% COMPARISON:  CT 11/10/2016 FINDINGS: Cardiovascular: Port in RIGHT chest wall with tip the distal SVC. No significant vascular findings. Normal heart size. No pericardial effusion. Mediastinum/Nodes: No axillary supraclavicular adenopathy. No mediastinal hilar adenopathy Lungs/Pleura: Mild atelectasis lung bases. Mild linear scarring LEFT lung apex. No suspicious nodularity. Pulmonary edema or pulmonary infection. Upper Abdomen: Limited view of the liver, kidneys, pancreas are unremarkable. Normal adrenal glands. Multiple small hepatic  hypodensities likely represent benign cysts for not comparison Musculoskeletal: Scatter sclerotic lesions in the spine for stable. Post LEFT mastectomy with breast prosthetic. IMPRESSION: 1. No evidence of breast cancer progression. 2. No acute pulmonary parenchymal findings. 3. Stable sclerotic lesions in the thoracic spine. Electronically Signed   By: Suzy Bouchard M.D.   On: 03/18/2017 12:14    Scheduled Meds: . anastrozole  1 mg Oral Daily  . enoxaparin (LOVENOX) injection  40 mg Subcutaneous Q24H  . gabapentin  600 mg Oral BID  . lamoTRIgine  150 mg Oral Daily  . lithium carbonate  450 mg Oral QHS  . loratadine  10 mg Oral Daily  . pantoprazole  40 mg Oral Daily  . potassium chloride  10 mEq Oral Daily  . QUEtiapine  50 mg Oral QHS  . sodium chloride flush  10-40 mL Intracatheter  Q12H  . sodium chloride flush  3 mL Intravenous Q12H  . traZODone  200 mg Oral QHS      Time spent: 15 min  Verneita Griffes, MD [facility] [phone]   03/19/2017, 2:38 PM  LOS: 2 days

## 2017-03-28 LAB — GLUCOSE, CAPILLARY: Glucose-Capillary: 86 mg/dL (ref 65–99)

## 2017-03-28 MED ORDER — SODIUM CHLORIDE 0.9% FLUSH
10.0000 mL | Freq: Two times a day (BID) | INTRAVENOUS | Status: DC
  Administered 2017-04-01 – 2017-04-04 (×2): 10 mL

## 2017-03-28 MED ORDER — SODIUM CHLORIDE 0.9% FLUSH
10.0000 mL | INTRAVENOUS | Status: DC | PRN
  Administered 2017-03-28 – 2017-04-06 (×5): 10 mL
  Filled 2017-03-28 (×6): qty 40

## 2017-03-28 NOTE — Progress Notes (Signed)
[facility]  PROGRESS NOTE  [name] IOM:[id number] DOB: [birth year] DOA: 03/16/2017 PCP: Arnoldo Morale, MD   Brief HPI:    56 year old female with history of alcohol abuse, bipolar disorder, left breast cancer with brain metastasis status post mastectomy and craniectomy with radiation to brain and breast came to ED with confusion and hallucinations. Patient started on IV Rocephin for acute cystitis.Patient has been seen by psychiatry and deemed not having decision-making capacity for her medical and psychiatric condition.   Subjective   No issues sitting comfy Asking about home   Assessment/Plan:     1. Acute encephalopathy- intermittent episodes of confusion and hallucinations-since patient had suboccipital craniectomy for resection of posterior fossa metastasis September 2017 requiring multiple hospitalizations. Patient was  started on Rocephin for possible UTI, urine culture  grew multiple species. MRI brain earlier this month was normal CT head was negative during this admission. Rocephin was discontinued. Patient was seen by psychiatry and deemed to have no decision-making capacity. Still having intermittent episodes of confusion.  Safety sitter at bedside--awaiting placement 2. Abnormal UA-  patient had abnormal UA, started on IV Rocephin empirically . Urine culture grew multiple species. Rocephin was discontinued.No new changes. 3. Bipolar disorder- patient was seen by psychiatry and dose of lithium changed to  450 mg by mouth daily at bedtime. Psych has seen the patient and deemed not having decision-making capacity. Recheck lithium level is 0.37, which is lower than the therapeutic range of 0.60-  1.20. Discussed with psychiatry, they will adjust the dose of lithium or consider stopping  it before discharge.  On Trileptal 300 bid-->450 bid and Gabapentin 600 bid, lamictal 200 qd, seroquel 50 hs, trazodone 200 hs and ativan 1 mg q4 prn--needOP Psych follow  up 4. Tremors-resolved, likely from lithium, dose of lithium d/c as per psych 5. History of breast cancer with brain metastasis- patient is status post mastectomy and axillary node dissection, suboccipital craniectomy for resection of posterior fossa metastasis, radiation to brain and breast. Continue Arimidex. Patient was seen by Dr. [name] in the hospital-currently Herceptin o be given    DVT prophylaxis: Lovenox  Code Status: Full code  Family Communication: No family present at bedside-d/w son on phone  Disposition Plan: Skilled nursing facility when can get placement  Antibiotics:   Anti-infectives    Start     Dose/Rate Route Frequency Ordered Stop   03/17/17 2000  cefTRIAXone (ROCEPHIN) 1 g in dextrose 5 % 50 mL IVPB  Status:  Discontinued     1 g 100 mL/hr over 30 Minutes Intravenous Every 24 hours 03/16/17 2043 03/18/17 1559   03/16/17 2015  cefTRIAXone (ROCEPHIN) 1 g in dextrose 5 % 50 mL IVPB     1 g 100 mL/hr over 30 Minutes Intravenous  Once 03/16/17 2000 03/16/17 2041       Objective   Vitals:   03/18/17 1031 03/18/17 1711 03/19/17 0433 03/19/17 0809  BP: (!) 106/41 116/65 (!) 73/39 (!) 104/57  Pulse: 96 87 79 89  Resp: 18 19 18 18   Temp:  97.9 F (36.6 C) 98.3 F (36.8 C) 98.7 F (37.1 C)  TempSrc:  Oral Oral Oral  SpO2: 100% 100% 97% 95%  Weight:   40.8 kg (90 lb)     Intake/Output Summary (Last 24 hours) at 03/19/17 1438 Last data filed at 03/19/17 0935  Gross per 24 hour  Intake              713 ml  Output  801 ml  Net              -88 ml   Filed Weights   03/19/17 0433  Weight: 40.8 kg (90 lb)     Physical Examination:  Physical Exam: Eyes: No icterus, alert  Mouth: Oral mucosa is moist, no lesions  Neck: Supple, no deformities, masses, or tenderness Lungs: Normal respiratory effort, bilateral clear to auscultation, abd soft  Data Reviewed: I have personally reviewed following labs and imaging studies  CBG:  Recent  Labs Lab 03/17/17 0759 03/18/17 1027 03/19/17 0815  GLUCAP 97 128* 105*    CBC:  Recent Labs Lab 03/16/17 1630 03/16/17 2134  WBC 4.5  --   NEUTROABS 3.1  --   HGB 11.5*  --   HCT 36.3 34.3  MCV 96.8  --   PLT 180  --     Basic Metabolic Panel:  Recent Labs Lab 03/16/17 1630 03/17/17 0501  NA 139 141  K 3.9 3.8  CL 105 107  CO2 24 26  GLUCOSE 96 94  BUN 5* 6  CREATININE 0.79 0.71  CALCIUM 9.8 9.4    Recent Results (from the past 240 hour(s))  Culture, Urine     Status: Abnormal   Collection Time: 03/16/17  7:15 PM  Result Value Ref Range Status   Specimen Description URINE, RANDOM  Final   Special Requests NONE  Final   Culture MULTIPLE SPECIES PRESENT, SUGGEST RECOLLECTION (A)  Final   Report Status 03/18/2017 FINAL  Final     Liver Function Tests:  Recent Labs Lab 03/16/17 1630  AST 15  ALT 11*  ALKPHOS 34*  BILITOT 0.5  PROT 5.5*  ALBUMIN 3.8   No results for input(s): LIPASE, AMYLASE in the last 168 hours.  Recent Labs Lab 03/16/17 2100  AMMONIA 16    Studies: Ct Chest W Contrast  Result Date: 03/18/2017 CLINICAL DATA:  Breast cancer.  Short breath.  Brain metastasis. EXAM: CT CHEST WITH CONTRAST TECHNIQUE: Multidetector CT imaging of the chest was performed during intravenous contrast administration. CONTRAST:  71mL ISOVUE-300 IOPAMIDOL (ISOVUE-300) INJECTION 61% COMPARISON:  CT 11/10/2016 FINDINGS: Cardiovascular: Port in RIGHT chest wall with tip the distal SVC. No significant vascular findings. Normal heart size. No pericardial effusion. Mediastinum/Nodes: No axillary supraclavicular adenopathy. No mediastinal hilar adenopathy Lungs/Pleura: Mild atelectasis lung bases. Mild linear scarring LEFT lung apex. No suspicious nodularity. Pulmonary edema or pulmonary infection. Upper Abdomen: Limited view of the liver, kidneys, pancreas are unremarkable. Normal adrenal glands. Multiple small hepatic hypodensities likely represent benign cysts for  not comparison Musculoskeletal: Scatter sclerotic lesions in the spine for stable. Post LEFT mastectomy with breast prosthetic. IMPRESSION: 1. No evidence of breast cancer progression. 2. No acute pulmonary parenchymal findings. 3. Stable sclerotic lesions in the thoracic spine. Electronically Signed   By: Suzy Bouchard M.D.   On: 03/18/2017 12:14    Scheduled Meds: . anastrozole  1 mg Oral Daily  . enoxaparin (LOVENOX) injection  40 mg Subcutaneous Q24H  . gabapentin  600 mg Oral BID  . lamoTRIgine  150 mg Oral Daily  . lithium carbonate  450 mg Oral QHS  . loratadine  10 mg Oral Daily  . pantoprazole  40 mg Oral Daily  . potassium chloride  10 mEq Oral Daily  . QUEtiapine  50 mg Oral QHS  . sodium chloride flush  10-40 mL Intracatheter Q12H  . sodium chloride flush  3 mL Intravenous Q12H  . traZODone  200 mg  Oral QHS      Time spent: 15 min  Verneita Griffes, MD [facility] [phone]   03/19/2017, 2:38 PM  LOS: 2 days

## 2017-03-29 ENCOUNTER — Ambulatory Visit: Payer: Medicaid Other | Admitting: Radiation Oncology

## 2017-03-29 NOTE — Progress Notes (Signed)
Pt wanted to shower today but had some dizziness and unsteady gait. Helper her wash up today.   Will continue to monitor.   Paulla Fore, RN

## 2017-03-29 NOTE — Progress Notes (Signed)
[facility]  PROGRESS NOTE  Renuka Farfan VZD:[id number] DOB: 04-Sep-1961 DOA: 03/16/2017 PCP: Arnoldo Morale, MD   Brief HPI:    56 year old female with history of alcohol abuse, bipolar disorder, left breast cancer with brain metastasis status post mastectomy and craniectomy with radiation to brain and breast came to ED with confusion and hallucinations. Patient started on IV Rocephin for acute cystitis.Patient has been seen by psychiatry and deemed not having decision-making capacity for her medical and psychiatric condition.   Subjective   Some sore throat today-no difficulty with swallow No cp No cough nor fever   Assessment/Plan:     1. Acute encephalopathy- intermittent episodes of confusion and hallucinations-since patient had suboccipital craniectomy for resection of posterior fossa metastasis September 2017 requiring multiple hospitalizations. Patient was  started on Rocephin for possible UTI, urine culture  grew multiple species. MRI brain earlier this month was normal CT head was negative during this admission. Rocephin was discontinued. Patient was seen by psychiatry and deemed to have no decision-making capacity. Still having intermittent episodes of confusion.  Safety sitter at bedside--awaiting placement 2. Sore throat-no lan no dysphagia-could be related to Herceptin.  Monitor-if wrosens, consider magic mouthwash or viscous lidocaine 3. Abnormal UA-  patient had abnormal UA, started on IV Rocephin empirically . Urine culture grew multiple species. Rocephin was discontinued.No new changes. 4. Bipolar disorder- patient was seen by psychiatry and dose of lithium changed to  450 mg by mouth daily at bedtime. Psych has seen the patient and deemed not having decision-making capacity. Recheck lithium level is 0.37, which is lower than the therapeutic range of 0.60-  1.20. Discussed with psychiatry, they will adjust the dose of lithium or consider stopping  it before discharge.  On  Trileptal 300 bid-->450 bid and Gabapentin 600 bid, lamictal 200 qd, seroquel 50 hs, trazodone 200 hs and ativan 1 mg q4 prn--needOP Psych follow up--seems to have stabilized well 5. Tremors-resolved, likely from lithium, dose of lithium d/c as per psych 6. History of breast cancer with brain metastasis- patient is status post mastectomy and axillary node dissection, suboccipital craniectomy for resection of posterior fossa metastasis, radiation to brain and breast. Continue Arimidex. Patient was seen by Dr. [name] in the hospital-currently Herceptin o be given    DVT prophylaxis: Lovenox  Code Status: Full code  Family Communication: No family present at bedside-d/w son on phone 4/30  Disposition Plan: Skilled nursing facility when can get placement-awaitign [facility] and S/w aware  Antibiotics:   Anti-infectives    Start     Dose/Rate Route Frequency Ordered Stop   03/17/17 2000  cefTRIAXone (ROCEPHIN) 1 g in dextrose 5 % 50 mL IVPB  Status:  Discontinued     1 g 100 mL/hr over 30 Minutes Intravenous Every 24 hours 03/16/17 2043 03/18/17 1559   03/16/17 2015  cefTRIAXone (ROCEPHIN) 1 g in dextrose 5 % 50 mL IVPB     1 g 100 mL/hr over 30 Minutes Intravenous  Once 03/16/17 2000 03/16/17 2041       Objective   Vitals:   03/18/17 1031 03/18/17 1711 03/19/17 0433 03/19/17 0809  BP: (!) 106/41 116/65 (!) 73/39 (!) 104/57  Pulse: 96 87 79 89  Resp: 18 19 18 18   Temp:  97.9 F (36.6 C) 98.3 F (36.8 C) 98.7 F (37.1 C)  TempSrc:  Oral Oral Oral  SpO2: 100% 100% 97% 95%  Weight:   40.8 kg (90 lb)     Intake/Output Summary (Last 24 hours) at  03/19/17 1438 Last data filed at 03/19/17 0935  Gross per 24 hour  Intake              713 ml  Output              801 ml  Net              -88 ml   Filed Weights   03/19/17 0433  Weight: 40.8 kg (90 lb)     Physical Examination:  Physical Exam: Eyes: No icterus, alert , no lan, mucosa moist, no erythema back of throat Mouth:  Oral mucosa is moist, no lesions  Neck: Supple, no deformities, masses, or tenderness Lungs: Normal respiratory effort, bilateral clear to auscultation, abd soft  Data Reviewed: I have personally reviewed following labs and imaging studies  CBG:  Recent Labs Lab 03/17/17 0759 03/18/17 1027 03/19/17 0815  GLUCAP 97 128* 105*    CBC:  Recent Labs Lab 03/16/17 1630 03/16/17 2134  WBC 4.5  --   NEUTROABS 3.1  --   HGB 11.5*  --   HCT 36.3 34.3  MCV 96.8  --   PLT 180  --     Basic Metabolic Panel:  Recent Labs Lab 03/16/17 1630 03/17/17 0501  NA 139 141  K 3.9 3.8  CL 105 107  CO2 24 26  GLUCOSE 96 94  BUN 5* 6  CREATININE 0.79 0.71  CALCIUM 9.8 9.4    Recent Results (from the past 240 hour(s))  Culture, Urine     Status: Abnormal   Collection Time: 03/16/17  7:15 PM  Result Value Ref Range Status   Specimen Description URINE, RANDOM  Final   Special Requests NONE  Final   Culture MULTIPLE SPECIES PRESENT, SUGGEST RECOLLECTION (A)  Final   Report Status 03/18/2017 FINAL  Final     Liver Function Tests:  Recent Labs Lab 03/16/17 1630  AST 15  ALT 11*  ALKPHOS 34*  BILITOT 0.5  PROT 5.5*  ALBUMIN 3.8   No results for input(s): LIPASE, AMYLASE in the last 168 hours.  Recent Labs Lab 03/16/17 2100  AMMONIA 16    Studies: Ct Chest W Contrast  Result Date: 03/18/2017 CLINICAL DATA:  Breast cancer.  Short breath.  Brain metastasis. EXAM: CT CHEST WITH CONTRAST TECHNIQUE: Multidetector CT imaging of the chest was performed during intravenous contrast administration. CONTRAST:  6mL ISOVUE-300 IOPAMIDOL (ISOVUE-300) INJECTION 61% COMPARISON:  CT 11/10/2016 FINDINGS: Cardiovascular: Port in RIGHT chest wall with tip the distal SVC. No significant vascular findings. Normal heart size. No pericardial effusion. Mediastinum/Nodes: No axillary supraclavicular adenopathy. No mediastinal hilar adenopathy Lungs/Pleura: Mild atelectasis lung bases. Mild linear  scarring LEFT lung apex. No suspicious nodularity. Pulmonary edema or pulmonary infection. Upper Abdomen: Limited view of the liver, kidneys, pancreas are unremarkable. Normal adrenal glands. Multiple small hepatic hypodensities likely represent benign cysts for not comparison Musculoskeletal: Scatter sclerotic lesions in the spine for stable. Post LEFT mastectomy with breast prosthetic. IMPRESSION: 1. No evidence of breast cancer progression. 2. No acute pulmonary parenchymal findings. 3. Stable sclerotic lesions in the thoracic spine. Electronically Signed   By: Suzy Bouchard M.D.   On: 03/18/2017 12:14    Scheduled Meds: . anastrozole  1 mg Oral Daily  . enoxaparin (LOVENOX) injection  40 mg Subcutaneous Q24H  . gabapentin  600 mg Oral BID  . lamoTRIgine  150 mg Oral Daily  . lithium carbonate  450 mg Oral QHS  . loratadine  10  mg Oral Daily  . pantoprazole  40 mg Oral Daily  . potassium chloride  10 mEq Oral Daily  . QUEtiapine  50 mg Oral QHS  . sodium chloride flush  10-40 mL Intracatheter Q12H  . sodium chloride flush  3 mL Intravenous Q12H  . traZODone  200 mg Oral QHS      Time spent: 15 min  Verneita Griffes, MD [facility] [phone]   03/19/2017, 2:38 PM  LOS: 2 days

## 2017-03-29 NOTE — Progress Notes (Signed)
Physical Therapy Treatment Patient Details Name: [name] MRN: [id number] DOB: 1961/03/07 Today's Date: 03/29/2017    History of Present Illness Pt is a 56 y/o female presented to the ED with confusion and hallucinations. Of note, pt recently d/c'd from [facility] on 03/01/17 for similar issues. 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    PT Comments    Session focused on discerning further impairments that contribute to [name]'s overall balance and fall risk; Performed Berg Balance Test, and score of 29/56 indicates risk of fall over the next year is close to 100%; she had to sit often during assessment, indicative of decr endurance overall and decr postural muscle endurance; noting incr sway with static standing and outright loss of balance with static small base of support tasks; perhaps most interesting was that Yamili indicated incr dizziness after the turning 360 deg task -- this dizziness persisted greater than a minute, and noted some abnormal eye movements as well; couple that with decr ability to stand with eyes closed, and I believe there is a big vestibular component to Ms. [name]'s fall risk; will ask for a vestibular assessment next session.   Follow Up Recommendations  SNF;Supervision/Assistance - 24 hour     Equipment Recommendations  Rolling walker with 5" wheels    Recommendations for Other Services       Precautions / Restrictions Precautions Precautions: Fall Precaution Comments: impulsive    Mobility  Bed Mobility                  Transfers Overall transfer level: Needs assistance Equipment used: None Transfers: Sit to/from Stand Sit to Stand: Supervision;Min assist         General transfer comment: Initial sit to stand with supervision; needing min assist for balance with sit to stands after performing 360 turn during Berg Assessment  Ambulation/Gait             General Gait  Details: Session focused on completing berg   Stairs            Wheelchair Mobility    Modified Rankin (Stroke Patients Only)       Balance     Sitting balance-Leahy Scale: Good       Standing balance-Leahy Scale: Fair Standing balance comment: for static standing, minguard to supervision; today, for dynamic balance tasks, needs mod to max assist to prevent fall                 Standardized Balance Assessment Standardized Balance Assessment : Berg Balance Test Berg Balance Test Sit to Stand: Able to stand without using hands and stabilize independently Standing Unsupported: Able to stand 30 seconds unsupported Sitting with Back Unsupported but Feet Supported on Floor or Stool: Able to sit safely and securely 2 minutes Stand to Sit: Controls descent by using hands Transfers: Able to transfer safely, definite need of hands Standing Unsupported with Eyes Closed: Able to stand 3 seconds Standing Ubsupported with Feet Together: Able to place feet together independently but unable to hold for 30 seconds From Standing, Reach Forward with Outstretched Arm: Can reach forward >5 cm safely (2") From Standing Position, Pick up Object from Floor: Able to pick up shoe, needs supervision From Standing Position, Turn to Look Behind Over each Shoulder: Turn sideways only but maintains balance Turn 360 Degrees: Needs assistance while turning Standing Unsupported, Alternately Place Feet on Step/Stool: Able to complete >2 steps/needs minimal assist Standing  Unsupported, One Foot in Front: Needs help to step but can hold 15 seconds Standing on One Leg: Unable to try or needs assist to prevent fall Total Score: 29        Cognition Arousal/Alertness: Awake/alert Behavior During Therapy: Impulsive (emationally labile in session) Overall Cognitive Status: No family/caregiver present to determine baseline cognitive functioning Area of Impairment: Safety/judgement;Memory                      Memory: Decreased short-term memory   Safety/Judgement: Decreased awareness of safety;Decreased awareness of deficits     General Comments: noted difficulty remembering choices while ordering lunch and dinner      Exercises      General Comments        Pertinent Vitals/Pain Pain Assessment: No/denies pain    Home Living                      Prior Function            PT Goals (current goals can now be found in the care plan section) Acute Rehab PT Goals Patient Stated Goal: to get out of the hospital PT Goal Formulation: Patient unable to participate in goal setting Time For Goal Achievement: 04/03/17 Potential to Achieve Goals: Fair Progress towards PT goals: Progressing toward goals    Frequency    Min 3X/week      PT Plan Current plan remains appropriate    Co-evaluation              AM-PAC PT "6 Clicks" Daily Activity  Outcome Measure  Difficulty turning over in bed (including adjusting bedclothes, sheets and blankets)?: None Difficulty moving from lying on back to sitting on the side of the bed? : None Difficulty sitting down on and standing up from a chair with arms (e.g., wheelchair, bedside commode, etc,.)?: None Help needed moving to and from a bed to chair (including a wheelchair)?: A Little Help needed walking in hospital room?: A Little Help needed climbing 3-5 steps with a railing? : A Little 6 Click Score: 21    End of Session Equipment Utilized During Treatment: Gait belt Activity Tolerance: Patient tolerated treatment well Patient left: in chair;with call bell/phone within reach;with chair alarm set Nurse Communication: Mobility status PT Visit Diagnosis: Unsteadiness on feet (R26.81);Other abnormalities of gait and mobility (R26.89);Muscle weakness (generalized) (M62.81)     Time: 5520-8022 PT Time Calculation (min) (ACUTE ONLY): 28 min  Charges:  $Therapeutic Activity: 23-37 mins                    G  Codes:       Roney Marion, PT  Acute Rehabilitation Services Pager [phone] Office [phone]    Colletta [place] 03/29/2017, 11:09 AM

## 2017-03-29 NOTE — Clinical Social Work Note (Signed)
CSW left voicemail for patient's son. CSW calling to find out if he has started a Medicaid application for the patient yet.  PASARR obtained: [phone] F. Still no bed offers.  [name], [place]

## 2017-03-30 ENCOUNTER — Telehealth: Payer: Self-pay | Admitting: Radiation Oncology

## 2017-03-30 LAB — GLUCOSE, CAPILLARY: Glucose-Capillary: 93 mg/dL (ref 65–99)

## 2017-03-30 LAB — CREATININE, SERUM
Creatinine, Ser: 0.93 mg/dL (ref 0.44–1.00)
GFR calc Af Amer: >60 mL/min (ref >60)
GFR calc non Af Amer: >60 mL/min (ref >60)

## 2017-03-30 MED ORDER — GI COCKTAIL ~~LOC~~: 30.0000 mL | Freq: Two times a day (BID) | ORAL | Status: DC | PRN

## 2017-03-30 NOTE — Care Management Note (Signed)
Case Management Note  Patient Details  Name: [name] MRN: [id number] Date of Birth: 06/17/1961  Subjective/Objective:          CM following for progression and d/c planning.           Action/Plan: 03/30/2017 ongoing efforts to arrange SNF services for this pt who has inadequate insurance to cover SNF placement. CSW Surveyor, quantity, [place] working on this with unit CSW.   Expected Discharge Date:                  Expected Discharge Plan:  Skilled Nursing Facility  In-House Referral:  Clinical Social Work  Discharge planning Services  CM Consult  Post Acute Care Choice:    Choice offered to:     DME Arranged:  N/A DME Agency:  NA  HH Arranged:    [place] Agency:     Status of Service:  Completed, signed off  If discussed at [employer], dates discussed:    Additional Comments:  Adron Bene, RN 03/30/2017, 3:25 PM

## 2017-03-30 NOTE — Progress Notes (Addendum)
Physical Therapy Treatment Patient Details Name: [name] MRN: [id number] DOB: [birth year] Today's Date: 03/30/2017    History of Present Illness Pt is a 56 y/o female presented to the ED with confusion and hallucinations. Of note, pt recently d/c'd from [facility] on 03/01/17 for similar issues. 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    PT Comments    Pt is showing signs of central vestibular dysfunction with difficulty stabilizing her gaze during mobility.  Due to significant cognitive deficits, it is hard to get her to do the x1 gaze stability exercises correctly.  She will need to get up and move around frequently to help decrease her symptoms because she will not likely be properly able to preform her HEP on her own.  Pt is not safe to return to a living situation where she is by herself.  SNF level rehab is her safest option with transition to ALF.    Follow Up Recommendations  SNF;Supervision/Assistance - 24 hour     Equipment Recommendations  Rolling walker with 5" wheels    Recommendations for Other Services   NA     Precautions / Restrictions Precautions Precautions: Fall Precaution Comments: impulsive    Mobility  Bed Mobility Overal bed mobility: Needs Assistance Bed Mobility: Rolling;Supine to Sit;Sit to Supine Rolling: Modified independent (Device/Increase time)   Supine to sit: Modified independent (Device/Increase time) Sit to supine: Modified independent (Device/Increase time)   General bed mobility comments: Pt able to get into and out of bed easily, however, when approaching the bed to get it, she paused and had difficulty figuring out how to do the task.    Transfers Overall transfer level: Needs assistance Equipment used: None Transfers: Sit to/from [facility] Sit to Stand: Min assist Stand pivot transfers: Min assist       General transfer comment: Min assist for balance  and safety  Ambulation/Gait Ambulation/Gait assistance: Min assist     Gait Pattern/deviations: Step-through pattern;Staggering left;Staggering right (sway back posture) Gait velocity: decreased Gait velocity interpretation: Below normal speed for age/gender General Gait Details: Pt with staggering gait pattern, generally looking straight down at her feet during gait.  When made to look up/down, left/right she has significant LOB doing these tasks an became increasingly nasueated.            Balance Overall balance assessment: Needs assistance Sitting-balance support: Feet supported;No upper extremity supported Sitting balance-Leahy Scale: Good     Standing balance support: No upper extremity supported Standing balance-Leahy Scale: Fair           03/30/17 1539  Vestibular Assessment  General Observation Eye alignment good, recent fall with L obit injury/bruising, recent (2017) brain surgery due to mets in brain (cerebellum and occipital regions).   Symptom Behavior  Type of Dizziness Comment (difficult historian, nausea)  Frequency of Dizziness when up and moving  Duration of Dizziness anytime she is up and moving  Aggravating Factors Activity in general (the more she walks the more nauseated she get)  Relieving Factors Head stationary;Lying supine  Occulomotor Exam  Occulomotor Alignment Normal  Spontaneous Absent  Gaze-induced Right beating nystagmus with R gaze (left beating nystagums with left gaze)  Smooth Pursuits Saccades  Saccades Dysmetria  Comment riged frog-hopping movement of eyes during smooth persuits, right beating with right gaze is more intense than left beating with left gaze  Vestibulo-Occular Reflex  VOR 1 Head Only (x 1 viewing) vertical  better than horizontal as far as range and speed, horizontal is slow and in very small ROM, pt reports they are both symtomatic and equally so.   Auditory  Comments finger rub test grossly equal bil.    Positional Testing  Dix-Hallpike Dix-Hallpike Right;Dix-Hallpike Left  Horizontal Canal Testing Horizontal Canal Right;Horizontal Canal Left  Dix-Hallpike Right  Dix-Hallpike Right Duration 0  Dix-Hallpike Right Symptoms No nystagmus  Dix-Hallpike Left  Dix-Hallpike Left Duration 0  Dix-Hallpike Left Symptoms No nystagmus  Horizontal Canal Right  Horizontal Canal Right Duration 0  Horizontal Canal Right Symptoms Normal  Horizontal Canal Left  Horizontal Canal Left Duration 0  Horizontal Canal Left Symptoms Normal  Cognition  Cognition Comment Impaired at baseline                        Cognition Arousal/Alertness: Awake/alert Behavior During Therapy: Impulsive (emotionally labile) Overall Cognitive Status: No family/caregiver present to determine baseline cognitive functioning Area of Impairment: Orientation;Memory;Attention;Following commands;Safety/judgement;Awareness;Problem solving                 Orientation Level: Disoriented to;Situation Current Attention Level: Selective Memory: Decreased short-term memory Following Commands: Follows multi-step commands inconsistently Safety/Judgement: Decreased awareness of safety;Decreased awareness of deficits Awareness: Intellectual Problem Solving: Slow processing;Difficulty sequencing;Requires verbal cues;Requires tactile cues General Comments: Very difficult following multi step commands for vestibular assement and cues for complex x 1 gaze stability exercises.  Pt needs near constant verbal cues to try to complete complex tasks.       Exercises Other Exercises Other Exercises: x1 seated exercises with max verbal cues and at times manual assist to complete within a full ROM and at a proper speed because she could not do this on her own. Pt is guarded when therapist tries to shake her head for the pt.  PT did not issue HEP program because of the amout of assist and cues needed to even attempt to complete exercises,  I knew that she would not do them properly.         Pertinent Vitals/Pain Pain Assessment: Faces Faces Pain Scale: Hurts little more Pain Location: neck and head Pain Descriptors / Indicators: Aching;Headache Pain Intervention(s): Limited activity within patient's tolerance;Monitored during session;Repositioned           PT Goals (current goals can now be found in the care plan section) Acute Rehab PT Goals Patient Stated Goal: to get out of the hospital Progress towards PT goals: Progressing toward goals    Frequency    Min 3X/week      PT Plan Current plan remains appropriate       AM-PAC PT "6 Clicks" Daily Activity  Outcome Measure  Difficulty turning over in bed (including adjusting bedclothes, sheets and blankets)?: None Difficulty moving from lying on back to sitting on the side of the bed? : None Difficulty sitting down on and standing up from a chair with arms (e.g., wheelchair, bedside commode, etc,.)?: None Help needed moving to and from a bed to chair (including a wheelchair)?: A Little Help needed walking in hospital room?: A Little Help needed climbing 3-5 steps with a railing? : A Little 6 Click Score: 21    End of Session Equipment Utilized During Treatment: Gait belt Activity Tolerance: Other (comment) (limited by some nausea at the end of the session. ) Patient left: in bed;with call bell/phone within reach;with bed alarm set;Other (comment) (with tele sitter in the room. )   PT Visit Diagnosis: Unsteadiness  on feet (R26.81);Other abnormalities of gait and mobility (R26.89);Muscle weakness (generalized) (M62.81)     Time: 1350-1436 PT Time Calculation (min) (ACUTE ONLY): 46 min  Charges:  $Gait Training: 8-22 mins $Therapeutic Exercise: 8-22 mins $Therapeutic Activity: 8-22 mins          Gage Treiber B. [place], [place], DPT [phone]           03/30/2017, 3:36 PM

## 2017-03-30 NOTE — Telephone Encounter (Signed)
LM again for pt's son to call us to discuss his mom's care.

## 2017-03-30 NOTE — Progress Notes (Signed)
[facility]  PROGRESS NOTE  [name] JTT:[id number] DOB: [birth year] DOA: 03/16/2017 PCP: Arnoldo Morale, MD   Brief HPI:    56 year old female with history of alcohol abuse, bipolar disorder, left breast cancer with brain metastasis status post mastectomy and craniectomy with radiation to brain and breast came to ED with confusion and hallucinations. Patient started on IV Rocephin for acute cystitis.Patient has been seen by psychiatry and deemed not having decision-making capacity for her medical and psychiatric condition.   Subjective   Asking for something for thrst Upset cannot e walked as frequently Nursing reports was very unsteady with gait yesterday No new issues Awaiting input for placement   Assessment/Plan:     1. Acute encephalopathy- intermittent episodes of confusion and hallucinations-since patient had suboccipital craniectomy for resection of posterior fossa metastasis September 2017 requiring multiple hospitalizations. Patient was  started on Rocephin for possible UTI, urine culture  grew multiple species. MRI brain earlier this month was normal CT head was negative during this admission. Rocephin was discontinued. Patient was seen by psychiatry and deemed to have no decision-making capacity. Still having intermittent episodes of confusion.  Safety sitter at bedside--awaiting placement.  No new issues 2. Sore throat-no lan no dysphagia-could be related to Herceptin.  Monitor-started GI cocktail 5/1 3. Abnormal UA-  patient had abnormal UA, started on IV Rocephin empirically . Urine culture grew multiple species. Rocephin was discontinued.No new changes. 4. Bipolar disorder- patient was seen by psychiatry and dose of lithium changed to  450 mg by mouth daily at bedtime. Psych has seen the patient and deemed not having decision-making capacity. Recheck lithium level is 0.37, which is lower than the therapeutic range of 0.60-  1.20. Discussed with psychiatry, they will  adjust the dose of lithium or consider stopping  it before discharge.  On Trileptal 300 bid-->450 bid and Gabapentin 600 bid, lamictal 200 qd, seroquel 50 hs, trazodone 200 hs and ativan 1 mg q4 prn--needOP Psych follow up--seems to have stabilized well 5. Tremors-resolved, likely from lithium, dose of lithium d/c as per psych 6. History of breast cancer with brain metastasis- patient is status post mastectomy and axillary node dissection, suboccipital craniectomy for resection of posterior fossa metastasis, radiation to brain and breast. Continue Arimidex. Patient was seen by Dr. [name] in the hospital-currently Herceptin o be given    DVT prophylaxis: Lovenox  Code Status: Full code  Family Communication: No family present at bedside-d/w son on phone 4/30  Disposition Plan: Skilled nursing facility when can get placement-awaitign PASSARR and S/w aware  Antibiotics:   Anti-infectives    Start     Dose/Rate Route Frequency Ordered Stop   03/17/17 2000  cefTRIAXone (ROCEPHIN) 1 g in dextrose 5 % 50 mL IVPB  Status:  Discontinued     1 g 100 mL/hr over 30 Minutes Intravenous Every 24 hours 03/16/17 2043 03/18/17 1559   03/16/17 2015  cefTRIAXone (ROCEPHIN) 1 g in dextrose 5 % 50 mL IVPB     1 g 100 mL/hr over 30 Minutes Intravenous  Once 03/16/17 2000 03/16/17 2041       Objective   Vitals:   03/18/17 1031 03/18/17 1711 03/19/17 0433 03/19/17 0809  BP: (!) 106/41 116/65 (!) 73/39 (!) 104/57  Pulse: 96 87 79 89  Resp: 18 19 18 18   Temp:  97.9 F (36.6 C) 98.3 F (36.8 C) 98.7 F (37.1 C)  TempSrc:  Oral Oral Oral  SpO2: 100% 100% 97% 95%  Weight:   40.8  kg (90 lb)     Intake/Output Summary (Last 24 hours) at 03/19/17 1438 Last data filed at 03/19/17 0935  Gross per 24 hour  Intake              713 ml  Output              801 ml  Net              -88 ml   Filed Weights   03/19/17 0433  Weight: 40.8 kg (90 lb)     Physical Examination:  Physical Exam: Eyes: No  icterus, alert , no lan, mucosa moist, no erythema back of throat Mouth: Oral mucosa is moist, no lesions  Neck: Supple, no deformities, masses, or tenderness Lungs: Normal respiratory effort, bilateral clear to auscultation, abd soft  Data Reviewed: I have personally reviewed following labs and imaging studies  CBG:  Recent Labs Lab 03/17/17 0759 03/18/17 1027 03/19/17 0815  GLUCAP 97 128* 105*    CBC:  Recent Labs Lab 03/16/17 1630 03/16/17 2134  WBC 4.5  --   NEUTROABS 3.1  --   HGB 11.5*  --   HCT 36.3 34.3  MCV 96.8  --   PLT 180  --     Basic Metabolic Panel:  Recent Labs Lab 03/16/17 1630 03/17/17 0501  NA 139 141  K 3.9 3.8  CL 105 107  CO2 24 26  GLUCOSE 96 94  BUN 5* 6  CREATININE 0.79 0.71  CALCIUM 9.8 9.4    Recent Results (from the past 240 hour(s))  Culture, Urine     Status: Abnormal   Collection Time: 03/16/17  7:15 PM  Result Value Ref Range Status   Specimen Description URINE, RANDOM  Final   Special Requests NONE  Final   Culture MULTIPLE SPECIES PRESENT, SUGGEST RECOLLECTION (A)  Final   Report Status 03/18/2017 FINAL  Final     Liver Function Tests:  Recent Labs Lab 03/16/17 1630  AST 15  ALT 11*  ALKPHOS 34*  BILITOT 0.5  PROT 5.5*  ALBUMIN 3.8   No results for input(s): LIPASE, AMYLASE in the last 168 hours.  Recent Labs Lab 03/16/17 2100  AMMONIA 16    Studies: Ct Chest W Contrast  Result Date: 03/18/2017 CLINICAL DATA:  Breast cancer.  Short breath.  Brain metastasis. EXAM: CT CHEST WITH CONTRAST TECHNIQUE: Multidetector CT imaging of the chest was performed during intravenous contrast administration. CONTRAST:  20mL ISOVUE-300 IOPAMIDOL (ISOVUE-300) INJECTION 61% COMPARISON:  CT 11/10/2016 FINDINGS: Cardiovascular: Port in RIGHT chest wall with tip the distal SVC. No significant vascular findings. Normal heart size. No pericardial effusion. Mediastinum/Nodes: No axillary supraclavicular adenopathy. No  mediastinal hilar adenopathy Lungs/Pleura: Mild atelectasis lung bases. Mild linear scarring LEFT lung apex. No suspicious nodularity. Pulmonary edema or pulmonary infection. Upper Abdomen: Limited view of the liver, kidneys, pancreas are unremarkable. Normal adrenal glands. Multiple small hepatic hypodensities likely represent benign cysts for not comparison Musculoskeletal: Scatter sclerotic lesions in the spine for stable. Post LEFT mastectomy with breast prosthetic. IMPRESSION: 1. No evidence of breast cancer progression. 2. No acute pulmonary parenchymal findings. 3. Stable sclerotic lesions in the thoracic spine. Electronically Signed   By: Suzy Bouchard M.D.   On: 03/18/2017 12:14    Scheduled Meds: . anastrozole  1 mg Oral Daily  . enoxaparin (LOVENOX) injection  40 mg Subcutaneous Q24H  . gabapentin  600 mg Oral BID  . lamoTRIgine  150 mg Oral Daily  .  lithium carbonate  450 mg Oral QHS  . loratadine  10 mg Oral Daily  . pantoprazole  40 mg Oral Daily  . potassium chloride  10 mEq Oral Daily  . QUEtiapine  50 mg Oral QHS  . sodium chloride flush  10-40 mL Intracatheter Q12H  . sodium chloride flush  3 mL Intravenous Q12H  . traZODone  200 mg Oral QHS      Time spent: 15 min  Verneita Griffes, MD [facility] [phone]   03/19/2017, 2:38 PM  LOS: 2 days

## 2017-03-30 NOTE — Progress Notes (Signed)
LCSW continues to follow for disposition:  Long term placement.  Call placed to medical director for guidance and potential placement options. Call placed to son regarding status of locating documentation for oil royalties.  Discussed with financial counselors as to why patient would have family medicaid vs adult medicaid.  Per counselors it could be because her royalties could be the barrier to receive disability due to over income and not qualifying .    Per notes, safest plan for patient is placement.  Currently we continue to assist with locating bed which currently there are no bed offers and assisting with a payor source as her current family medicaid will not pay for placement.    Will await phone calls back in reference to assistance from medical director and understanding from son.  Lane Hacker, MSW Clinical Social Work: [employer] Coverage for :  [phone]

## 2017-03-31 LAB — GLUCOSE, CAPILLARY: Glucose-Capillary: 96 mg/dL (ref 65–99)

## 2017-03-31 MED ORDER — HALOPERIDOL LACTATE 5 MG/ML IJ SOLN
2.0000 mg | Freq: Four times a day (QID) | INTRAMUSCULAR | Status: DC | PRN
  Administered 2017-04-01: 2 mg via INTRAVENOUS
  Filled 2017-03-31: qty 1

## 2017-03-31 MED ORDER — LORAZEPAM 2 MG/ML IJ SOLN
2.0000 mg | Freq: Once | INTRAMUSCULAR | Status: AC
  Administered 2017-03-31: 2 mg via INTRAVENOUS
  Filled 2017-03-31: qty 1

## 2017-03-31 MED ORDER — QUETIAPINE FUMARATE 50 MG PO TABS
100.0000 mg | ORAL_TABLET | Freq: Every day | ORAL | Status: DC
  Administered 2017-03-31 – 2017-04-03 (×4): 100 mg via ORAL
  Filled 2017-03-31 (×4): qty 2

## 2017-03-31 NOTE — Progress Notes (Signed)
Nutrition Follow-up  DOCUMENTATION CODES:   Not applicable  INTERVENTION:  Continue Ensure Enlive po TID, each supplement provides 350 kcal and 20 grams of protein.  Encourage adequate PO intake.   NUTRITION DIAGNOSIS:   Predicted suboptimal nutrient intake related to cancer and cancer related treatments as evidenced by percent weight loss; ongoing  GOAL:   Patient will meet greater than or equal to 90% of their needs; met  MONITOR:   PO intake, Supplement acceptance, Labs, Weight trends, Skin, I & O's  REASON FOR ASSESSMENT:   Rounds    ASSESSMENT:   [name] is a 56 y.o. 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  Pt was unavailable during time of visit. Most recent meal completions have been 80-100%. Pt currently has Ensure ordered with varied consumption. RD to continue with current orders to aid in caloric and protein needs. Encourage adequate PO intake. SW working on placement for discharge.  Diet Order:  Diet regular Room service appropriate? Yes; Fluid consistency: Thin  Skin:  Reviewed, no issues  Last BM:  5/1  Height:   Ht Readings from Last 1 Encounters:  03/24/17 5' 5"  (1.651 m)    Weight:   Wt Readings from Last 1 Encounters:  03/30/17 118 lb (53.5 kg)    Ideal Body Weight:  56.8 kg  BMI:  Body mass index is 19.64 kg/m.  Estimated Nutritional Needs:   Kcal:  1600-1800  Protein:  80-95 grams  Fluid:  1.6-1.8 L  EDUCATION NEEDS:   Education needs no appropriate at this time  Corrin Parker, MS, RD, LDN Pager # [phone] After hours/ weekend pager # [phone]

## 2017-03-31 NOTE — Progress Notes (Signed)
Paged Dr. Peggye Fothergill. Advised Director [employer] on floor.

## 2017-03-31 NOTE — Progress Notes (Signed)
Pt had assisted fall to while transferring to bed on floor pad.  No apparent injuries. Denies pain.

## 2017-03-31 NOTE — Progress Notes (Signed)
Attempted to call pts sister paige.  Number in emergency contact is no longer in service.  Pt unable to advise current number.

## 2017-03-31 NOTE — Progress Notes (Signed)
Pt asked to get into chair, this RN placed pt into chair, pt began to become upset and started to get up from chair, this RN was holding on to pt, pt began stating she did not want to sit in chair, however wanted to sit on the floor. Pt sat in floor. 2nd RN assisted pt to chair and MD called for plan. Restraints placed for pt safety.

## 2017-03-31 NOTE — Progress Notes (Signed)
Pt bed alarm going off after back to bed.  Moved back to nurses desk for closer obs. Pt denies pain, no apparent injury.

## 2017-03-31 NOTE — Progress Notes (Signed)
PROGRESS NOTE  [name]  ZJI:[id number] DOB: 1961/10/15 DOA: 03/16/2017 PCP: Arnoldo Morale, MD  Brief Narrative:   56 year old female with history of alcohol abuse, bipolar disorder, left breast cancer with brain metastasis status post mastectomy and craniectomy with radiation to brain and breast came to ED with confusion and hallucinations.  She was treated with ceftriaxone for acute cystitis but continues to have intermittent confusion.  Patient has been seen by psychiatry and deemed not having decision-making capacity for her medical and psychiatric condition.  PASSAR completed but awaiting placement.    Assessment & Plan:   Principal Problem:   Acute encephalopathy Active Problems:   Brain metastasis (HCC)   Metastatic breast cancer (HCC)   Bipolar I disorder, most recent episode depressed ([place])   Acute lower UTI  Acute on chronic encephalopathy related to suboccipital craniectomy for resection of posterior fossa metastases in September 2017.  MRI of the brain prior to admission was stable and her head CT during this admission demonstrated no acute changes. She was treated for urinary tract infection but continued to have in her mitten episodes of confusion requiring a Air cabin crew. She was seen by psychiatry who determined that she does not have decision-making capacity regarding medical problems or disposition.  Sore throat, improving with GI cocktail  Acute cystitis, present at time of admission, completed treatment with ceftriaxone.  Bipolar disorder, seen by psychiatry.  Currently very tearful.  Psych has seen the patient and deemed not having decision-making capacity.  Psychiatry recommended stopping lithium.  On Trileptal 300 bid-->450 bid and Gabapentin 600 bid, lamictal 200 qd, seroquel 50 hs, trazodone 200 hs and ativan 1 mg q4 prn --need OP Psych follow up  Tremors, resolved with cessation of lithium  History of metastatic breast cancer to brain.  Status post  mastectomy and axillary node dissection, suboccipital craniectomy for resection of posterior fossa metastasis, radiation to brain and breast. Continue Arimidex and Herceptin. Patient was seen by Dr. [name] in the hospital  DVT prophylaxis:  Lovenox Code Status:  Full code Family Communication:  Patient only Disposition Plan:  PASSARR complete.  SW working on placement.     Consultants:   Psychiatry  Cardiology  Procedures:   none  Antimicrobials:  Anti-infectives    Start     Dose/Rate Route Frequency Ordered Stop   03/17/17 2000  cefTRIAXone (ROCEPHIN) 1 g in dextrose 5 % 50 mL IVPB  Status:  Discontinued     1 g 100 mL/hr over 30 Minutes Intravenous Every 24 hours 03/16/17 2043 03/18/17 1559   03/16/17 2015  cefTRIAXone (ROCEPHIN) 1 g in dextrose 5 % 50 mL IVPB     1 g 100 mL/hr over 30 Minutes Intravenous  Once 03/16/17 2000 03/16/17 2041       Subjective:  Feels sad/depressed.  Just wants to go home.  Has chronic discomfort from the bed and upset that she is being forced to stay in bed.    Objective: Vitals:   03/30/17 1830 03/30/17 2035 03/31/17 0403 03/31/17 1000  BP: (!) 126/59 115/62 (!) 101/57 (!) 101/53  Pulse: 77 75 73 80  Resp: 18 18 16 18   Temp: 98.4 F (36.9 C) 98.2 F (36.8 C) 98.1 F (36.7 C) 98.1 F (36.7 C)  TempSrc: Oral Oral Axillary Oral  SpO2: 98% 98% 96% 97%  Weight:  53.5 kg (118 lb)    Height:        Intake/Output Summary (Last 24 hours) at 03/31/17 1236 Last data filed  at 03/31/17 1100  Gross per 24 hour  Intake              600 ml  Output             1101 ml  Net             -501 ml   Filed Weights   03/27/17 0455 03/28/17 2131 03/30/17 2035  Weight: 57.6 kg (127 lb) 58.2 kg (128 lb 3.2 oz) 53.5 kg (118 lb)    Examination:  General exam:  Adult female.  No acute distress.   HEENT:  NCAT, MMM Respiratory system: Clear to auscultation bilaterally Cardiovascular system: Regular rate and rhythm, normal S1/S2. No murmurs,  rubs, gallops or clicks.  Warm extremities Gastrointestinal system: Normal active bowel sounds, soft, nondistended, nontender. MSK:  Normal tone and bulk, no lower extremity edema Neuro:  Face symmetric, pupils equal round and reactive to light, palate normal.  4/5 bilateral upper and lower extremity strength.  No drift.  Sensation intact to light touch.   Psych:  Tearful.  A&O x 4.  Used date and month on wall to orient.      Data Reviewed: I have personally reviewed following labs and imaging studies  CBC:  Recent Labs Lab 03/26/17 0436 03/27/17 0441  WBC 3.8* 2.9*  NEUTROABS 2.2  --   HGB 11.4* 11.1*  HCT 35.1* 34.4*  MCV 94.6 94.8  PLT 179 160   Basic Metabolic Panel:  Recent Labs Lab 03/26/17 0436 03/27/17 0441 03/30/17 1021  NA 141 140  --   K 3.4* 3.7  --   CL 108 108  --   CO2 29 28  --   GLUCOSE 101* 101*  --   BUN 6 8  --   CREATININE 0.68 0.69 0.93  CALCIUM 9.1 9.0  --    GFR: Estimated Creatinine Clearance: 57.7 mL/min (by C-G formula based on SCr of 0.93 mg/dL). Liver Function Tests:  Recent Labs Lab 03/26/17 0436  AST 14*  ALT 12*  ALKPHOS 33*  BILITOT 0.2*  PROT 5.2*  ALBUMIN 3.4*   No results for input(s): LIPASE, AMYLASE in the last 168 hours. No results for input(s): AMMONIA in the last 168 hours. Coagulation Profile: No results for input(s): INR, PROTIME in the last 168 hours. Cardiac Enzymes: No results for input(s): CKTOTAL, CKMB, CKMBINDEX, TROPONINI in the last 168 hours. BNP (last 3 results) No results for input(s): PROBNP in the last 8760 hours. HbA1C: No results for input(s): HGBA1C in the last 72 hours. CBG:  Recent Labs Lab 03/26/17 0750 03/27/17 0817 03/28/17 0807 03/30/17 0835 03/31/17 0754  GLUCAP 101* 91 86 93 96   Lipid Profile: No results for input(s): CHOL, HDL, LDLCALC, TRIG, CHOLHDL, LDLDIRECT in the last 72 hours. Thyroid Function Tests: No results for input(s): TSH, T4TOTAL, FREET4, T3FREE, THYROIDAB  in the last 72 hours. Anemia Panel: No results for input(s): VITAMINB12, FOLATE, FERRITIN, TIBC, IRON, RETICCTPCT in the last 72 hours. Urine analysis:    Component Value Date/Time   COLORURINE YELLOW 03/16/2017 1915   APPEARANCEUR CLEAR 03/16/2017 1915   LABSPEC 1.009 03/16/2017 1915   PHURINE 6.0 03/16/2017 1915   GLUCOSEU NEGATIVE 03/16/2017 1915   HGBUR SMALL (A) 03/16/2017 1915   BILIRUBINUR NEGATIVE 03/16/2017 1915   BILIRUBINUR negative 03/28/2015 1321   KETONESUR 20 (A) 03/16/2017 1915   PROTEINUR NEGATIVE 03/16/2017 1915   UROBILINOGEN 0.2 03/28/2015 1321   UROBILINOGEN 0.2 04/18/2013 1222   NITRITE NEGATIVE 03/16/2017  [place] (A) 03/16/2017 1915   Sepsis Labs: @LABRCNTIP (procalcitonin:4,lacticidven:4)  )No results found for this or any previous visit (from the past 240 hour(s)).    Radiology Studies: No results found.   Scheduled Meds: . anastrozole  1 mg Oral Daily  . enoxaparin (LOVENOX) injection  40 mg Subcutaneous Q24H  . feeding supplement (ENSURE ENLIVE)  237 mL Oral TID BM  . gabapentin  600 mg Oral BID  . lamoTRIgine  200 mg Oral Daily  . loratadine  10 mg Oral Daily  . OXcarbazepine  450 mg Oral BID  . pantoprazole  40 mg Oral Daily  . potassium chloride  10 mEq Oral Daily  . QUEtiapine  50 mg Oral QHS  . sodium chloride flush  10-40 mL Intracatheter Q12H  . sodium chloride flush  10-40 mL Intracatheter Q12H  . sodium chloride flush  3 mL Intravenous Q12H  . traZODone  200 mg Oral QHS   Continuous Infusions: . sodium chloride    . sodium chloride       LOS: 14 days    Time spent: 30 min    Janece Canterbury, MD [facility] Pager [phone]  If 7PM-7AM, please contact night-coverage [email or web address] Password [facility] 03/31/2017, 12:36 PM

## 2017-04-01 LAB — GLUCOSE, CAPILLARY
Glucose-Capillary: 85 mg/dL (ref 65–99)
Glucose-Capillary: 104 mg/dL — ABNORMAL HIGH (ref 65–99)

## 2017-04-01 NOTE — Progress Notes (Signed)
Occupational Therapy Treatment Patient Details Name: [name] MRN: [id number] DOB: 1961-04-18 Today's Date: 04/01/2017    History of present illness Pt is a 56 y/o female presented to the ED with confusion and hallucinations. Of note, pt recently d/c'd from [facility] on [facility]/01/17 for similar issues. 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   OT comments  This 56 yo female admitted with above not really making progress since eval due to unsteadiness on feet, impulsivity, decreased awareness of deficits, and decreased safety awareness. She will continue to benefit from acute OT to try to work towards a S level.  Follow Up Recommendations  SNF;Supervision/Assistance - 24 hour    Equipment Recommendations  Other (comment) (TBD at next venue)       Precautions / Restrictions Precautions Precautions: Fall Precaution Comments: impulsive Restrictions Weight Bearing Restrictions: No       Mobility Bed Mobility Overal bed mobility: Needs Assistance Bed Mobility: Supine to Sit;Sit to Supine     Supine to sit: Supervision Sit to supine: Supervision      Transfers Overall transfer level: Needs assistance Equipment used: Rolling walker (2 wheeled) Transfers: Sit to/from Stand Sit to Stand: Min guard         General transfer comment: ambulation to bathroom min A with RW    Balance Overall balance assessment: Needs assistance Sitting-balance support: Feet supported;No upper extremity supported Sitting balance-Leahy Scale: Good     Standing balance support: No upper extremity supported;During functional activity Standing balance-Leahy Scale: Fair Standing balance comment: standing at sink to wash hands                           ADL either performed or assessed with clinical judgement   ADL Overall ADL's : Needs assistance/impaired Eating/Feeding: Bed level;Moderate assistance Eating/Feeding Details  (indicate cue type and reason): dropping food, would not allow me to reposition her to be able to get to and eat her food better Grooming: Wash/dry hands;Standing;Min guard Grooming Details (indicate cue type and reason): Not safe, put walker off to side and washed her hands from the side of the sink not the front                 Toilet Transfer: Minimal assistance;Ambulation;Comfort height toilet;Grab bars;RW   Toileting- Water quality scientist and Hygiene: Min guard;Sit to/from stand                         Cognition Arousal/Alertness: Awake/alert Behavior During Therapy: Impulsive Overall Cognitive Status: No family/caregiver present to determine baseline cognitive functioning Area of Impairment: Orientation;Attention;Following commands;Safety/judgement;Problem solving                 Orientation Level: Place;Situation Current Attention Level: Selective   Following Commands: Follows one step commands inconsistently Safety/Judgement: Decreased awareness of safety   Problem Solving: Slow processing;Decreased initiation;Difficulty sequencing;Requires verbal cues;Requires tactile cues                     Pertinent Vitals/ Pain       Pain Assessment: No/denies pain         Frequency  Min 2X/week        Progress Toward Goals  OT Goals(current goals can now be found in the care plan section)  Progress towards OT goals: Progressing toward goals     Plan Discharge plan remains appropriate  AM-PAC PT "6 Clicks" Daily Activity     Outcome Measure   Help from another person eating meals?: A Lot (i believe more due to positioning today (which pt would not let me change)) Help from another person taking care of personal grooming?: A Little Help from another person toileting, which includes using toliet, bedpan, or urinal?: A Little Help from another person bathing (including washing, rinsing, drying)?: A Little Help from another person to put  on and taking off regular upper body clothing?: A Little Help from another person to put on and taking off regular lower body clothing?: A Little 6 Click Score: 17    End of Session Equipment Utilized During Treatment: Gait belt;Rolling walker  OT Visit Diagnosis: Muscle weakness (generalized) (M62.81);Other abnormalities of gait and mobility (R26.89);Cognitive communication deficit (R41.841) Symptoms and signs involving cognitive functions: Nontraumatic intracerebral hemorrhage   Activity Tolerance Patient tolerated treatment well   Patient Left in bed;with call bell/phone within reach;with bed alarm set   Nurse Communication  (pt had bowel movement and ate all the lunch she wanted)        Time: 2992-4268 OT Time Calculation (min): 29 min  Charges: OT General Charges $OT Visit: 1 Procedure OT Treatments $Self Care/Home Management : 23-37 mins  [place], OTR/L [phone] 04/01/2017

## 2017-04-01 NOTE — Progress Notes (Signed)
PROGRESS NOTE  [name]  QPR:[id number] DOB: [birth year] DOA: 03/16/2017 PCP: Arnoldo Morale, MD  Brief Narrative:   56 year old female with history of alcohol abuse, bipolar disorder, left breast cancer with brain metastasis status post mastectomy and craniectomy with radiation to brain and breast came to ED with confusion and hallucinations.  She was treated with ceftriaxone for acute cystitis but continues to have intermittent confusion.  Patient has been seen by psychiatry and deemed not having decision-making capacity for her medical and psychiatric condition.  PASSAR completed but awaiting placement.    Assessment & Plan:   Principal Problem:   Acute encephalopathy Active Problems:   Brain metastasis (HCC)   Metastatic breast cancer (HCC)   Bipolar I disorder, most recent episode depressed ([place])   Acute lower UTI  Acute on chronic encephalopathy related to suboccipital craniectomy for resection of posterior fossa metastases in September 2017.  MRI of the brain prior to admission was stable and her head CT during this admission demonstrated no acute changes. She was treated for urinary tract infection but continued to have in her mitten episodes of confusion requiring a Air cabin crew. She was seen by psychiatry who determined that she does not have decision-making capacity regarding medical problems or disposition. -  Increased agitation and tearfulness on 5/2 -  Had controlled fall to mat on floor on 5/2  Sore throat, improving with GI cocktail  Acute cystitis, present at time of admission, completed treatment with ceftriaxone.  Bipolar disorder, seen by psychiatry.  Currently very tearful.  Psych has seen the patient and deemed not having decision-making capacity.  Psychiatry recommended stopping lithium.  On Trileptal 300 bid-->450 bid and Gabapentin 600 bid, lamictal 200 qd, seroquel 50 hs, trazodone 200 hs and ativan 1 mg q4 prn -- requested that psychiatry reevaluate patient  given tearfulness and agitation -  Continue posey belt -  Add prn haldol -  Increased seroquel  Tremors, improved with cessation of lithium  History of metastatic breast cancer to brain.  Status post mastectomy and axillary node dissection, suboccipital craniectomy for resection of posterior fossa metastasis, radiation to brain and breast. Continue Arimidex and Herceptin. Patient was seen by Dr. [name] in the hospital  DVT prophylaxis:  Lovenox Code Status:  Full code Family Communication:  Patient only Disposition Plan:  PASSARR complete.  SW working on placement.     Consultants:   Psychiatry  Cardiology  Procedures:   none  Antimicrobials:  Anti-infectives    Start     Dose/Rate Route Frequency Ordered Stop   03/17/17 2000  cefTRIAXone (ROCEPHIN) 1 g in dextrose 5 % 50 mL IVPB  Status:  Discontinued     1 g 100 mL/hr over 30 Minutes Intravenous Every 24 hours 03/16/17 2043 03/18/17 1559   03/16/17 2015  cefTRIAXone (ROCEPHIN) 1 g in dextrose 5 % 50 mL IVPB     1 g 100 mL/hr over 30 Minutes Intravenous  Once 03/16/17 2000 03/16/17 2041       Subjective:  Feels sad/depressed.  Just wants to go home.  Very agitated yesterday and required additional ativan IV and posey belt.   Has chronic discomfort from the bed and upset that she is being forced to stay in bed.    Objective: Vitals:   03/31/17 2100 04/01/17 0431 04/01/17 1020 04/01/17 1718  BP: 101/66 104/66 (!) 109/59 (!) 94/55  Pulse: 88 78 80 84  Resp:  18 18   Temp: 97.8 F (36.6 C) 98 F (36.7  C) 98.2 F (36.8 C) 98.5 F (36.9 C)  TempSrc: Oral Oral Oral Oral  SpO2: 96% 100% 100% 100%  Weight:      Height:        Intake/Output Summary (Last 24 hours) at 04/01/17 1759 Last data filed at 04/01/17 1400  Gross per 24 hour  Intake              360 ml  Output             1225 ml  Net             -865 ml   Filed Weights   03/27/17 0455 03/28/17 2131 03/30/17 2035  Weight: 57.6 kg (127 lb) 58.2 kg  (128 lb 3.2 oz) 53.5 kg (118 lb)    Examination:  General exam:  Adult female.  No acute distress.  Tearful and angry during interview HEENT:  NCAT, MMM Respiratory system: Clear to auscultation bilaterally Cardiovascular system: Regular rate and rhythm, normal S1/S2. No murmurs, rubs, gallops or clicks.  Warm extremities Gastrointestinal system: Normal active bowel sounds, soft, nondistended, nontender. MSK:  Normal tone and bulk, no lower extremity edema Neuro:  Face symmetric, pupils equal round and reactive to light, palate normal.  4/5 bilateral upper and lower extremity strength.  No drift.  Sensation intact to light touch.   Psych:  Crying during visit    Data Reviewed: I have personally reviewed following labs and imaging studies  CBC:  Recent Labs Lab 03/26/17 0436 03/27/17 0441  WBC 3.8* 2.9*  NEUTROABS 2.2  --   HGB 11.4* 11.1*  HCT 35.1* 34.4*  MCV 94.6 94.8  PLT 179 443   Basic Metabolic Panel:  Recent Labs Lab 03/26/17 0436 03/27/17 0441 03/30/17 1021  NA 141 140  --   K 3.4* 3.7  --   CL 108 108  --   CO2 29 28  --   GLUCOSE 101* 101*  --   BUN 6 8  --   CREATININE 0.68 0.69 0.93  CALCIUM 9.1 9.0  --    GFR: Estimated Creatinine Clearance: 57.7 mL/min (by C-G formula based on SCr of 0.93 mg/dL). Liver Function Tests:  Recent Labs Lab 03/26/17 0436  AST 14*  ALT 12*  ALKPHOS 33*  BILITOT 0.2*  PROT 5.2*  ALBUMIN 3.4*   No results for input(s): LIPASE, AMYLASE in the last 168 hours. No results for input(s): AMMONIA in the last 168 hours. Coagulation Profile: No results for input(s): INR, PROTIME in the last 168 hours. Cardiac Enzymes: No results for input(s): CKTOTAL, CKMB, CKMBINDEX, TROPONINI in the last 168 hours. BNP (last 3 results) No results for input(s): PROBNP in the last 8760 hours. HbA1C: No results for input(s): HGBA1C in the last 72 hours. CBG:  Recent Labs Lab 03/28/17 0807 03/30/17 0835 03/31/17 0754  04/01/17 0758 04/01/17 1136  GLUCAP 86 93 96 85 104*   Lipid Profile: No results for input(s): CHOL, HDL, LDLCALC, TRIG, CHOLHDL, LDLDIRECT in the last 72 hours. Thyroid Function Tests: No results for input(s): TSH, T4TOTAL, FREET4, T3FREE, THYROIDAB in the last 72 hours. Anemia Panel: No results for input(s): VITAMINB12, FOLATE, FERRITIN, TIBC, IRON, RETICCTPCT in the last 72 hours. Urine analysis:    Component Value Date/Time   COLORURINE YELLOW 03/16/2017 1915   APPEARANCEUR CLEAR 03/16/2017 1915   LABSPEC 1.009 03/16/2017 1915   PHURINE 6.0 03/16/2017 1915   GLUCOSEU NEGATIVE 03/16/2017 1915   HGBUR SMALL (A) 03/16/2017 1915   BILIRUBINUR NEGATIVE  03/16/2017 1915   BILIRUBINUR negative 03/28/2015 1321   KETONESUR 20 (A) 03/16/2017 1915   PROTEINUR NEGATIVE 03/16/2017 1915   UROBILINOGEN 0.2 03/28/2015 1321   UROBILINOGEN 0.2 04/18/2013 1222   NITRITE NEGATIVE 03/16/2017 1915   LEUKOCYTESUR LARGE (A) 03/16/2017 1915   Sepsis Labs: @LABRCNTIP (procalcitonin:4,lacticidven:4)  )No results found for this or any previous visit (from the past 240 hour(s)).    Radiology Studies: No results found.   Scheduled Meds: . anastrozole  1 mg Oral Daily  . enoxaparin (LOVENOX) injection  40 mg Subcutaneous Q24H  . feeding supplement (ENSURE ENLIVE)  237 mL Oral TID BM  . gabapentin  600 mg Oral BID  . lamoTRIgine  200 mg Oral Daily  . loratadine  10 mg Oral Daily  . OXcarbazepine  450 mg Oral BID  . pantoprazole  40 mg Oral Daily  . potassium chloride  10 mEq Oral Daily  . QUEtiapine  100 mg Oral QHS  . sodium chloride flush  10-40 mL Intracatheter Q12H  . sodium chloride flush  10-40 mL Intracatheter Q12H  . sodium chloride flush  3 mL Intravenous Q12H  . traZODone  200 mg Oral QHS   Continuous Infusions: . sodium chloride    . sodium chloride       LOS: 15 days    Time spent: 30 min    Janece Canterbury, MD [facility] Pager [phone]  If 7PM-7AM,  please contact night-coverage [email or web address] Password [email or web address] 04/01/2017, 5:59 PM

## 2017-04-01 NOTE — Progress Notes (Signed)
PT Cancellation Note  Patient Details Name: [name] MRN: [id number] DOB: 1961/07/11   Cancelled Treatment:    Reason Eval/Treat Not Completed: Fatigue/lethargy limiting ability to participate.  Pt asleep, eyes tightly clenched shut.  When attempted to wake pt states, "tomorrow, tomorrow" multiple times.  She did not want to walk right now reporting fatigue.  PT to check back tomorrow.   Thanks,    Barbarann Ehlers. Anza, [place], DPT [phone]   04/01/2017, 3:00 PM

## 2017-04-01 NOTE — Progress Notes (Signed)
Pt refusing to let this RN perform 12 lead or taking morning meds.. MD notified.

## 2017-04-02 LAB — GLUCOSE, CAPILLARY: Glucose-Capillary: 97 mg/dL (ref 65–99)

## 2017-04-02 NOTE — Clinical Social Work Note (Signed)
CSW continuing to follow for disposition. Patient is DTP (Difficult to Place) and Assistant Social Work Mudlogger is working on discharge disposition for patient. Patient is continuing to need restraints/sitters due to trying to walk unassisted, etc. CSW will continue to follow and facilitate placement once a facility is secured.  Jocsan Mcginley Givens, MSW, LCSW Licensed Clinical Social Worker [place] [phone]

## 2017-04-02 NOTE — Progress Notes (Addendum)
PROGRESS NOTE  [name]  RKY:[id number] DOB: [birth year] DOA: 03/16/2017 PCP: Arnoldo Morale, MD  Brief Narrative:   56 year old female with history of alcohol abuse, bipolar disorder, left breast cancer with brain metastasis status post mastectomy and craniectomy with radiation to brain and breast came to ED with confusion and hallucinations.  She was treated with ceftriaxone for acute cystitis but continues to have intermittent confusion.  Patient has been seen by psychiatry and deemed not having decision-making capacity for her medical and psychiatric condition.  PASSAR completed but awaiting placement.    Assessment & Plan:   Principal Problem:   Acute encephalopathy Active Problems:   Brain metastasis (HCC)   Metastatic breast cancer (HCC)   Bipolar I disorder, most recent episode depressed ([place])   Acute lower UTI  Acute on chronic encephalopathy related to suboccipital craniectomy for resection of posterior fossa metastases in September 2017.  MRI of the brain prior to admission was stable and her head CT during this admission demonstrated no acute changes. She was treated for urinary tract infection but continued to have in her mitten episodes of confusion requiring a Air cabin crew. She was seen by psychiatry who determined that she does not have decision-making capacity regarding medical problems or disposition. -  Increased agitation and tearfulness on 5/2 -  Had controlled fall to mat on floor on 5/2 -  Continues to require tele sitter and has posey restraint -  Refused some medications yesterday -  Refusing questioning and examination today  Sore throat, improving with GI cocktail  Acute cystitis, present at time of admission, completed treatment with ceftriaxone.  Bipolar disorder, seen by psychiatry.  Currently very tearful.  Psych has seen the patient and deemed not having decision-making capacity.  Psychiatry recommended stopping lithium.  On Trileptal 300 bid-->450  bid and Gabapentin 600 bid, lamictal 200 qd, seroquel 50 hs, trazodone 200 hs and ativan 1 mg q4 prn -- requested that psychiatry reevaluate patient given tearfulness and agitation.  Did not see on 5/3 so have called again on 5/4 -  Continue posey belt -  Add prn haldol -  Increased seroquel  Tremors, improved with cessation of lithium  History of metastatic breast cancer to brain.  Status post mastectomy and axillary node dissection, suboccipital craniectomy for resection of posterior fossa metastasis, radiation to brain and breast. Continue Arimidex and Herceptin. Patient was seen by Dr. [name] in the hospital  DVT prophylaxis:  Lovenox Code Status:  Full code Family Communication:  Patient only Disposition Plan:  PASSARR complete.  SW working on placement.     Consultants:   Psychiatry  Cardiology  Procedures:   none  Antimicrobials:  Anti-infectives    Start     Dose/Rate Route Frequency Ordered Stop   03/17/17 2000  cefTRIAXone (ROCEPHIN) 1 g in dextrose 5 % 50 mL IVPB  Status:  Discontinued     1 g 100 mL/hr over 30 Minutes Intravenous Every 24 hours 03/16/17 2043 03/18/17 1559   03/16/17 2015  cefTRIAXone (ROCEPHIN) 1 g in dextrose 5 % 50 mL IVPB     1 g 100 mL/hr over 30 Minutes Intravenous  Once 03/16/17 2000 03/16/17 2041       Subjective:  Feels sad/depressed.  Refuses to answer questions today.  States she doesn't feel like talking to anyone today and wants me to go away.  Declines examination today.    Objective: Vitals:   04/01/17 1718 04/01/17 2030 04/02/17 0910 04/02/17 1000  BP: (!) 94/55  108/68 102/61 (!) 109/59  Pulse: 84 82 77 80  Resp:  18 16 16   Temp: 98.5 F (36.9 C) 98.5 F (36.9 C) 98.4 F (36.9 C) 98.2 F (36.8 C)  TempSrc: Oral Oral Oral Oral  SpO2: 100% 95% 96% 100%  Weight:      Height:        Intake/Output Summary (Last 24 hours) at 04/02/17 1335 Last data filed at 04/02/17 1241  Gross per 24 hour  Intake              600  ml  Output              300 ml  Net              300 ml   Filed Weights   03/27/17 0455 03/28/17 2131 03/30/17 2035  Weight: 57.6 kg (127 lb) 58.2 kg (128 lb 3.2 oz) 53.5 kg (118 lb)    Examination:  General exam:  Adult female.  No acute distress, lying on side in bed.  Eyes closed and refuses to open while conversing    Data Reviewed: I have personally reviewed following labs and imaging studies  CBC:  Recent Labs Lab 03/27/17 0441  WBC 2.9*  HGB 11.1*  HCT 34.4*  MCV 94.8  PLT 469   Basic Metabolic Panel:  Recent Labs Lab 03/27/17 0441 03/30/17 1021  NA 140  --   K 3.7  --   CL 108  --   CO2 28  --   GLUCOSE 101*  --   BUN 8  --   CREATININE 0.69 0.93  CALCIUM 9.0  --    GFR: Estimated Creatinine Clearance: 57.7 mL/min (by C-G formula based on SCr of 0.93 mg/dL). Liver Function Tests: No results for input(s): AST, ALT, ALKPHOS, BILITOT, PROT, ALBUMIN in the last 168 hours. No results for input(s): LIPASE, AMYLASE in the last 168 hours. No results for input(s): AMMONIA in the last 168 hours. Coagulation Profile: No results for input(s): INR, PROTIME in the last 168 hours. Cardiac Enzymes: No results for input(s): CKTOTAL, CKMB, CKMBINDEX, TROPONINI in the last 168 hours. BNP (last 3 results) No results for input(s): PROBNP in the last 8760 hours. HbA1C: No results for input(s): HGBA1C in the last 72 hours. CBG:  Recent Labs Lab 03/30/17 0835 03/31/17 0754 04/01/17 0758 04/01/17 1136 04/02/17 0759  GLUCAP 93 96 85 104* 97   Lipid Profile: No results for input(s): CHOL, HDL, LDLCALC, TRIG, CHOLHDL, LDLDIRECT in the last 72 hours. Thyroid Function Tests: No results for input(s): TSH, T4TOTAL, FREET4, T3FREE, THYROIDAB in the last 72 hours. Anemia Panel: No results for input(s): VITAMINB12, FOLATE, FERRITIN, TIBC, IRON, RETICCTPCT in the last 72 hours. Urine analysis:    Component Value Date/Time   COLORURINE YELLOW 03/16/2017 1915    APPEARANCEUR CLEAR 03/16/2017 1915   LABSPEC 1.009 03/16/2017 1915   PHURINE 6.0 03/16/2017 1915   GLUCOSEU NEGATIVE 03/16/2017 1915   HGBUR SMALL (A) 03/16/2017 1915   BILIRUBINUR NEGATIVE 03/16/2017 1915   BILIRUBINUR negative 03/28/2015 1321   KETONESUR 20 (A) 03/16/2017 1915   PROTEINUR NEGATIVE 03/16/2017 1915   UROBILINOGEN 0.2 03/28/2015 1321   UROBILINOGEN 0.2 04/18/2013 1222   NITRITE NEGATIVE 03/16/2017 1915   LEUKOCYTESUR LARGE (A) 03/16/2017 1915   Sepsis Labs: @LABRCNTIP (procalcitonin:4,lacticidven:4)  )No results found for this or any previous visit (from the past 240 hour(s)).    Radiology Studies: No results found.   Scheduled Meds: . anastrozole  1  mg Oral Daily  . enoxaparin (LOVENOX) injection  40 mg Subcutaneous Q24H  . feeding supplement (ENSURE ENLIVE)  237 mL Oral TID BM  . gabapentin  600 mg Oral BID  . lamoTRIgine  200 mg Oral Daily  . loratadine  10 mg Oral Daily  . OXcarbazepine  450 mg Oral BID  . pantoprazole  40 mg Oral Daily  . potassium chloride  10 mEq Oral Daily  . QUEtiapine  100 mg Oral QHS  . sodium chloride flush  10-40 mL Intracatheter Q12H  . sodium chloride flush  10-40 mL Intracatheter Q12H  . sodium chloride flush  3 mL Intravenous Q12H  . traZODone  200 mg Oral QHS   Continuous Infusions: . sodium chloride    . sodium chloride       LOS: 16 days    Time spent: 30 min    Janece Canterbury, MD [facility] Pager [phone]  If 7PM-7AM, please contact night-coverage [email or web address] Password [email or web address] 04/02/2017, 1:35 PM

## 2017-04-02 NOTE — Progress Notes (Signed)
Physical Therapy Treatment Patient Details Name: [name] MRN: [id number] DOB: 1961/02/13 Today's Date: 04/02/2017    History of Present Illness Pt is a 56 y/o female presented to the ED with confusion and hallucinations. Of note, pt recently d/c'd from [facility] on 03/01/17 for similar issues. 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    PT Comments    Patient remains limited due to decreased safety awareness, poor activity tolerance and noted increased balance difficulty/veering with walker with increased fatigue. Continues to be appropriate for SNF level rehab.  Will need goal update next session.   Follow Up Recommendations  SNF;Supervision/Assistance - 24 hour     Equipment Recommendations  Rolling walker with 5" wheels    Recommendations for Other Services       Precautions / Restrictions Precautions Precautions: Fall Precaution Comments: impulsive    Mobility  Bed Mobility Overal bed mobility: Needs Assistance Bed Mobility: Supine to Sit     Supine to sit: Supervision Sit to supine: Supervision   General bed mobility comments: assist for safety, to remove restraints and for positioning bed  Transfers Overall transfer level: Needs assistance Equipment used: None Transfers: Sit to/from Stand Sit to Stand: Min guard         General transfer comment: assist for safety/balance  Ambulation/Gait Ambulation/Gait assistance: Min assist;Mod assist Ambulation Distance (Feet): 100 Feet Assistive device: Rolling walker (2 wheeled) Gait Pattern/deviations: Decreased stride length;Drifts right/left;Step-through pattern;Staggering left;Narrow base of support     General Gait Details: veering badly to L after turning in hallway to return to room, assist for walker and for safety increased with increased gait, though pt did not complain of lightheadeness or vertigo   Stairs            Wheelchair  Mobility    Modified Rankin (Stroke Patients Only)       Balance Overall balance assessment: Needs assistance Sitting-balance support: Feet supported;No upper extremity supported Sitting balance-Leahy Scale: Good     Standing balance support: No upper extremity supported;During functional activity Standing balance-Leahy Scale: Fair                              Cognition Arousal/Alertness: Awake/alert Behavior During Therapy: Impulsive Overall Cognitive Status: No family/caregiver present to determine baseline cognitive functioning Area of Impairment: Orientation;Attention;Following commands;Safety/judgement;Problem solving                 Orientation Level: Place;Situation Current Attention Level: Selective Memory: Decreased short-term memory Following Commands: Follows one step commands with increased time Safety/Judgement: Decreased awareness of safety Awareness: Intellectual   General Comments: noted continued gaze induced nystagmus, saccadic smooth pursuits and min c/o lightheadedness with motion, though eager initially to get OOB; ready to return to supine after ambulation      Exercises      General Comments        Pertinent Vitals/Pain Faces Pain Scale: Hurts little more Pain Location: head and back Pain Descriptors / Indicators: Aching;Headache Pain Intervention(s): Monitored during session;Patient requesting pain meds-RN notified    Home Living                      Prior Function            PT Goals (current goals can now be found in the care plan section) Progress towards PT goals: Progressing toward goals  Frequency    Min 3X/week      PT Plan Current plan remains appropriate    Co-evaluation              AM-PAC PT "6 Clicks" Daily Activity  Outcome Measure  Difficulty turning over in bed (including adjusting bedclothes, sheets and blankets)?: None Difficulty moving from lying on back to sitting on  the side of the bed? : None Difficulty sitting down on and standing up from a chair with arms (e.g., wheelchair, bedside commode, etc,.)?: A Little Help needed moving to and from a bed to chair (including a wheelchair)?: A Little Help needed walking in hospital room?: A Little Help needed climbing 3-5 steps with a railing? : A Little 6 Click Score: 20    End of Session Equipment Utilized During Treatment: Gait belt Activity Tolerance: Patient tolerated treatment well Patient left: in bed;with call bell/phone within reach;with restraints reapplied Nurse Communication: Mobility status PT Visit Diagnosis: Unsteadiness on feet (R26.81);Other abnormalities of gait and mobility (R26.89);Muscle weakness (generalized) (M62.81)     Time: 1281-1886 PT Time Calculation (min) (ACUTE ONLY): 28 min  Charges:  $Gait Training: 8-22 mins $Therapeutic Activity: 8-22 mins                    G CodesMagda Hunter, [place] [phone] 04/02/2017    [name] 04/02/2017, 5:15 PM

## 2017-04-03 LAB — GLUCOSE, CAPILLARY: Glucose-Capillary: 71 mg/dL (ref 65–99)

## 2017-04-03 NOTE — Progress Notes (Signed)
PROGRESS NOTE  [name]  YBW:[id number] DOB: [birth year] DOA: 03/16/2017 PCP: Arnoldo Morale, MD  Brief Narrative:   56 year old female with history of alcohol abuse, bipolar disorder, left breast cancer with brain metastasis status post mastectomy and craniectomy with radiation to brain and breast came to ED with confusion and hallucinations.  She was treated with ceftriaxone for acute cystitis but continues to have intermittent confusion.  Patient has been seen by psychiatry and deemed not having decision-making capacity for her medical and psychiatric condition.  PASSAR completed but awaiting placement.    Assessment & Plan:   Principal Problem:   Acute encephalopathy Active Problems:   Brain metastasis (HCC)   Metastatic breast cancer (HCC)   Bipolar I disorder, most recent episode depressed ([place])   Acute lower UTI  Acute on chronic encephalopathy related to suboccipital craniectomy for resection of posterior fossa metastases in September 2017.  MRI of the brain prior to admission was stable and her head CT during this admission demonstrated no acute changes. She was treated for urinary tract infection but continued to have in her mitten episodes of confusion requiring a Air cabin crew. She was seen by psychiatry who determined that she does not have decision-making capacity regarding medical problems or disposition. -  Increased agitation and tearfulness on 5/2 -  Had controlled fall to mat on floor on 5/2 -  Continues to require tele sitter and has posey restraint  Sore throat, improving with GI cocktail  Acute cystitis, present at time of admission, completed treatment with ceftriaxone.  Bipolar disorder, seen by psychiatry.  Currently very tearful.  Psych has seen the patient and deemed not having decision-making capacity.  Psychiatry recommended stopping lithium.  On Trileptal 300 bid-->450 bid and Gabapentin 600 bid, lamictal 200 qd, seroquel 50 hs, trazodone 200 hs and ativan  1 mg q4 prn -  Continue posey belt -  Continue prn haldol -  Continue seroquel  Tremors, improved with cessation of lithium  History of metastatic breast cancer to brain.  Status post mastectomy and axillary node dissection, suboccipital craniectomy for resection of posterior fossa metastasis, radiation to brain and breast. Continue Arimidex and Herceptin. Patient was seen by Dr. [name] in the hospital  DVT prophylaxis:  Lovenox Code Status:  Full code Family Communication:  Patient only Disposition Plan:  PASSARR complete.  SW working on placement.     Consultants:   Psychiatry  Cardiology  Procedures:   none  Antimicrobials:  Anti-infectives    Start     Dose/Rate Route Frequency Ordered Stop   03/17/17 2000  cefTRIAXone (ROCEPHIN) 1 g in dextrose 5 % 50 mL IVPB  Status:  Discontinued     1 g 100 mL/hr over 30 Minutes Intravenous Every 24 hours 03/16/17 2043 03/18/17 1559   03/16/17 2015  cefTRIAXone (ROCEPHIN) 1 g in dextrose 5 % 50 mL IVPB     1 g 100 mL/hr over 30 Minutes Intravenous  Once 03/16/17 2000 03/16/17 2041       Subjective:  Feeling better today.  Wants to talk to the Education officer, museum.  Wants to be safe when she is discharged, but wants to go home and there is no one there with her.    Objective: Vitals:   04/02/17 1620 04/02/17 2025 04/03/17 0447 04/03/17 1058  BP: 116/63 110/62 101/63 117/60  Pulse: (!) 103 83 69 97  Resp: 16 16 16 17   Temp: 98 F (36.7 C) 98.7 F (37.1 C) 97.9 F (36.6 C) 98.1  F (36.7 C)  TempSrc: Oral Oral Oral Oral  SpO2: 99% 100% 99% 96%  Weight:  54.6 kg (120 lb 6.4 oz)    Height:        Intake/Output Summary (Last 24 hours) at 04/03/17 1528 Last data filed at 04/03/17 1449  Gross per 24 hour  Intake              460 ml  Output             1000 ml  Net             -540 ml   Filed Weights   03/28/17 2131 03/30/17 2035 04/02/17 2025  Weight: 58.2 kg (128 lb 3.2 oz) 53.5 kg (118 lb) 54.6 kg (120 lb 6.4 oz)     Examination:  General exam:  Adult female.  No acute distress.  Sitting in chair HEENT:  NCAT, MMM Respiratory system: Clear to auscultation bilaterally Cardiovascular system: Regular rate and rhythm, normal S1/S2. No murmurs, rubs, gallops or clicks.  Warm extremities Gastrointestinal system: Normal active bowel sounds, soft, nondistended, nontender. MSK:  Normal tone and bulk, no lower extremity edema Neuro:  grossly intact Psych:  less tearful and agitated today  Data Reviewed: I have personally reviewed following labs and imaging studies  CBC: No results for input(s): WBC, NEUTROABS, HGB, HCT, MCV, PLT in the last 168 hours. Basic Metabolic Panel:  Recent Labs Lab 03/30/17 1021  CREATININE 0.93   GFR: Estimated Creatinine Clearance: 58.9 mL/min (by C-G formula based on SCr of 0.93 mg/dL). Liver Function Tests: No results for input(s): AST, ALT, ALKPHOS, BILITOT, PROT, ALBUMIN in the last 168 hours. No results for input(s): LIPASE, AMYLASE in the last 168 hours. No results for input(s): AMMONIA in the last 168 hours. Coagulation Profile: No results for input(s): INR, PROTIME in the last 168 hours. Cardiac Enzymes: No results for input(s): CKTOTAL, CKMB, CKMBINDEX, TROPONINI in the last 168 hours. BNP (last 3 results) No results for input(s): PROBNP in the last 8760 hours. HbA1C: No results for input(s): HGBA1C in the last 72 hours. CBG:  Recent Labs Lab 03/31/17 0754 04/01/17 0758 04/01/17 1136 04/02/17 0759 04/03/17 0755  GLUCAP 96 85 104* 97 71   Lipid Profile: No results for input(s): CHOL, HDL, LDLCALC, TRIG, CHOLHDL, LDLDIRECT in the last 72 hours. Thyroid Function Tests: No results for input(s): TSH, T4TOTAL, FREET4, T3FREE, THYROIDAB in the last 72 hours. Anemia Panel: No results for input(s): VITAMINB12, FOLATE, FERRITIN, TIBC, IRON, RETICCTPCT in the last 72 hours. Urine analysis:    Component Value Date/Time   COLORURINE YELLOW 03/16/2017  1915   APPEARANCEUR CLEAR 03/16/2017 1915   LABSPEC 1.009 03/16/2017 1915   PHURINE 6.0 03/16/2017 1915   GLUCOSEU NEGATIVE 03/16/2017 1915   HGBUR SMALL (A) 03/16/2017 1915   BILIRUBINUR NEGATIVE 03/16/2017 1915   BILIRUBINUR negative 03/28/2015 1321   KETONESUR 20 (A) 03/16/2017 1915   PROTEINUR NEGATIVE 03/16/2017 1915   UROBILINOGEN 0.2 03/28/2015 1321   UROBILINOGEN 0.2 04/18/2013 1222   NITRITE NEGATIVE 03/16/2017 1915   LEUKOCYTESUR LARGE (A) 03/16/2017 1915   Sepsis Labs: @LABRCNTIP (procalcitonin:4,lacticidven:4)  )No results found for this or any previous visit (from the past 240 hour(s)).    Radiology Studies: No results found.   Scheduled Meds: . anastrozole  1 mg Oral Daily  . enoxaparin (LOVENOX) injection  40 mg Subcutaneous Q24H  . feeding supplement (ENSURE ENLIVE)  237 mL Oral TID BM  . gabapentin  600 mg Oral BID  .  lamoTRIgine  200 mg Oral Daily  . loratadine  10 mg Oral Daily  . OXcarbazepine  450 mg Oral BID  . pantoprazole  40 mg Oral Daily  . potassium chloride  10 mEq Oral Daily  . QUEtiapine  100 mg Oral QHS  . sodium chloride flush  10-40 mL Intracatheter Q12H  . sodium chloride flush  10-40 mL Intracatheter Q12H  . sodium chloride flush  3 mL Intravenous Q12H  . traZODone  200 mg Oral QHS   Continuous Infusions: . sodium chloride    . sodium chloride       LOS: 17 days    Time spent: 30 min    Janece Canterbury, MD [facility] Pager [phone]  If 7PM-7AM, please contact night-coverage [email or web address] Password [email or web address] 04/03/2017, 3:28 PM

## 2017-04-03 NOTE — Progress Notes (Signed)
10:13 am

## 2017-04-03 NOTE — Progress Notes (Signed)
Occupational Therapy Treatment Patient Details Name: [name] MRN: [id number] DOB: [birth year] Today's Date: 04/03/2017    History of present illness Pt is a 56 y/o female presented to the ED with confusion and hallucinations. Of note, pt recently d/c'd from [facility] on 03/01/17 for similar issues. 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   OT comments  Pt. Able to complete grooming and bathing tasks this day.  Continues to struggle with sequencing and attention to task.  Will continue to follow acutely.    Follow Up Recommendations  SNF;Supervision/Assistance - 24 hour    Equipment Recommendations       Recommendations for Other Services      Precautions / Restrictions Precautions Precautions: Fall Precaution Comments: impulsive Restrictions Weight Bearing Restrictions: No Other Position/Activity Restrictions: per IV team pt. only allowed to sponge bathe, no showers at this time       Mobility Bed Mobility Overal bed mobility: Needs Assistance             General bed mobility comments: pt. standing at sink with RN upon arrival into room  Transfers Overall transfer level: Needs assistance Equipment used: None Transfers: Sit to/from [facility] Sit to Stand: Min guard Stand pivot transfers: Min assist       General transfer comment: assist for safety/balance    Balance                                           ADL either performed or assessed with clinical judgement   ADL Overall ADL's : Needs assistance/impaired     Grooming: Wash/dry hands;Wash/dry face;Oral care;Standing;Minimal assistance   Upper Body Bathing: Set up;Sitting   Lower Body Bathing: Minimal assistance;Sit to/from stand;Cueing for sequencing   Upper Body Dressing : Minimal assistance;Standing   Lower Body Dressing: Set up;Sitting/lateral leans   Toilet Transfer: Minimal  assistance;Ambulation         Tub/Shower Transfer Details (indicate cue type and reason): PER IV TEAM PT. ALLOWED TO SPONGE BATHE ONLY   General ADL Comments: continues with poor safety awareness and impulsitivity.  max cues for initiation and completetion of tasks.  easily distracted.       Vision       Perception     Praxis      Cognition Arousal/Alertness: Awake/alert Behavior During Therapy: Impulsive Overall Cognitive Status: No family/caregiver present to determine baseline cognitive functioning Area of Impairment: Orientation;Attention;Following commands;Safety/judgement;Problem solving                 Orientation Level: Place;Situation Current Attention Level: Selective Memory: Decreased short-term memory Following Commands: Follows one step commands with increased time Safety/Judgement: Decreased awareness of safety Awareness: Intellectual Problem Solving: Slow processing;Decreased initiation;Difficulty sequencing;Requires verbal cues;Requires tactile cues          Exercises     Shoulder Instructions       General Comments      Pertinent Vitals/ Pain       Pain Assessment: No/denies pain  Home Living                                          Prior Functioning/Environment  Frequency  Min 2X/week        Progress Toward Goals  OT Goals(current goals can now be found in the care plan section)  Progress towards OT goals: Progressing toward goals     Plan Discharge plan remains appropriate    Co-evaluation                 AM-PAC PT "6 Clicks" Daily Activity     Outcome Measure   Help from another person eating meals?: A Lot Help from another person taking care of personal grooming?: A Little Help from another person toileting, which includes using toliet, bedpan, or urinal?: A Little Help from another person bathing (including washing, rinsing, drying)?: A Little Help from another person to  put on and taking off regular upper body clothing?: A Little Help from another person to put on and taking off regular lower body clothing?: A Little 6 Click Score: 17    End of Session Equipment Utilized During Treatment: Gait belt  OT Visit Diagnosis: Muscle weakness (generalized) (M62.81);Other abnormalities of gait and mobility (R26.89);Cognitive communication deficit (R41.841) Symptoms and signs involving cognitive functions: Nontraumatic intracerebral hemorrhage   Activity Tolerance Patient tolerated treatment well   Patient Left in chair;with call bell/phone within reach;with restraints reapplied   Nurse Communication          Time: 2725-3664 OT Time Calculation (min): 42 min  Charges: OT General Charges $OT Visit: 1 Procedure OT Treatments $Self Care/Home Management : 38-52 mins    Janice Coffin, COTA/L 04/03/2017, 10:37 AM

## 2017-04-04 LAB — GLUCOSE, CAPILLARY: Glucose-Capillary: 93 mg/dL (ref 65–99)

## 2017-04-04 MED ORDER — QUETIAPINE FUMARATE 50 MG PO TABS
50.0000 mg | ORAL_TABLET | Freq: Every day | ORAL | Status: DC
  Administered 2017-04-04 – 2017-04-06 (×3): 50 mg via ORAL
  Filled 2017-04-04 (×3): qty 1

## 2017-04-04 MED ORDER — GABAPENTIN 300 MG PO CAPS
300.0000 mg | ORAL_CAPSULE | Freq: Two times a day (BID) | ORAL | Status: DC
  Administered 2017-04-04 – 2017-04-07 (×6): 300 mg via ORAL
  Filled 2017-04-04 (×6): qty 1

## 2017-04-04 MED ORDER — OXCARBAZEPINE 300 MG PO TABS
300.0000 mg | ORAL_TABLET | Freq: Two times a day (BID) | ORAL | Status: DC
  Administered 2017-04-04 – 2017-04-07 (×6): 300 mg via ORAL
  Filled 2017-04-04 (×6): qty 1

## 2017-04-04 NOTE — Progress Notes (Signed)
PROGRESS NOTE  [name]  ZOX:[id number] DOB: 12-02-1960 DOA: 03/16/2017 PCP: Arnoldo Morale, MD  Brief Narrative:   56 year old female with history of alcohol abuse, bipolar disorder, left breast cancer with brain metastasis status post mastectomy and craniectomy with radiation to brain and breast came to ED with confusion and hallucinations.  She was treated with ceftriaxone for acute cystitis but continues to have intermittent confusion.  Patient has been seen by psychiatry and deemed not having decision-making capacity for her medical and psychiatric condition.  PASSAR completed but awaiting placement.    Assessment & Plan:   Principal Problem:   Acute encephalopathy Active Problems:   Brain metastasis (HCC)   Metastatic breast cancer (HCC)   Bipolar I disorder, most recent episode depressed ([place])   Acute lower UTI  Acute on chronic encephalopathy related to suboccipital craniectomy for resection of posterior fossa metastases in September 2017.  MRI of the brain prior to admission was stable and her head CT during this admission demonstrated no acute changes. She was treated for urinary tract infection but continued to have in her mitten episodes of confusion requiring a Air cabin crew. She was seen by psychiatry who determined that she does not have decision-making capacity regarding medical problems or disposition. -  Increased agitation and tearfulness starting on 5/2 -  Had controlled fall to mat on floor on 5/2 -  Continues to require tele sitter and has posey restraint  Sore throat, improving with GI cocktail  Acute cystitis, present at time of admission, completed treatment with ceftriaxone.  Bipolar disorder, seen by psychiatry.  Currently very tearful.  Psych has seen the patient and deemed not having decision-making capacity.  Psychiatry recommended stopping lithium.  On Trileptal 300 bid-->450 bid and Gabapentin 600 bid, lamictal 200 qd, seroquel 50 hs, trazodone 200 hs  and ativan 1 mg q4 prn -  Continue posey belt -  Continue prn haldol -  Continue seroquel -  Psychiatry to reevaluate  Tremors, improved with cessation of lithium  History of metastatic breast cancer to brain.  Status post mastectomy and axillary node dissection, suboccipital craniectomy for resection of posterior fossa metastasis, radiation to brain and breast. Continue Arimidex and Herceptin. Patient was seen by Dr. [name] in the hospital  DVT prophylaxis:  Lovenox Code Status:  Full code Family Communication:  Patient only Disposition Plan:  PASSARR complete.  SW working on placement.     Consultants:   Psychiatry  Cardiology  Procedures:   none  Antimicrobials:  Anti-infectives    Start     Dose/Rate Route Frequency Ordered Stop   03/17/17 2000  cefTRIAXone (ROCEPHIN) 1 g in dextrose 5 % 50 mL IVPB  Status:  Discontinued     1 g 100 mL/hr over 30 Minutes Intravenous Every 24 hours 03/16/17 2043 03/18/17 1559   03/16/17 2015  cefTRIAXone (ROCEPHIN) 1 g in dextrose 5 % 50 mL IVPB     1 g 100 mL/hr over 30 Minutes Intravenous  Once 03/16/17 2000 03/16/17 2041       Subjective:  Still very upset and tearful regarding wanting to leave hospital/disposition.  Wants to be safe when she is discharged, but wants to go home and there is no one there with her.    Objective: Vitals:   04/03/17 1058 04/03/17 1807 04/03/17 2041 04/04/17 0923  BP: 117/60 127/74 110/66 107/63  Pulse: 97 (!) 103 97 93  Resp: 17 17 17 16   Temp: 98.1 F (36.7 C) 98.4 F (36.9 C)  98.5 F (36.9 C) 97.7 F (36.5 C)  TempSrc: Oral Oral Oral Oral  SpO2: 96% 96% 98% 96%  Weight:   54.9 kg (121 lb)   Height:        Intake/Output Summary (Last 24 hours) at 04/04/17 1235 Last data filed at 04/04/17 7322  Gross per 24 hour  Intake              550 ml  Output             2250 ml  Net            -1700 ml   Filed Weights   03/30/17 2035 04/02/17 2025 04/03/17 2041  Weight: 53.5 kg (118 lb)  54.6 kg (120 lb 6.4 oz) 54.9 kg (121 lb)    Examination:  General exam:  Adult female.  No acute distress.  Getting up to use bathroom with CNA HEENT:  NCAT, MMM Respiratory system: Clear to auscultation bilaterally Cardiovascular system: Regular rate and rhythm, normal S1/S2. No murmurs, rubs, gallops or clicks.  Warm extremities Gastrointestinal system: Normal active bowel sounds, soft, nondistended, nontender. MSK:  Normal tone and bulk, no lower extremity edema Neuro:  grossly intact Psych:  Tearful/crying but not agitated today  Data Reviewed: I have personally reviewed following labs and imaging studies  CBC: No results for input(s): WBC, NEUTROABS, HGB, HCT, MCV, PLT in the last 168 hours. Basic Metabolic Panel:  Recent Labs Lab 03/30/17 1021  CREATININE 0.93   GFR: Estimated Creatinine Clearance: 59.2 mL/min (by C-G formula based on SCr of 0.93 mg/dL). Liver Function Tests: No results for input(s): AST, ALT, ALKPHOS, BILITOT, PROT, ALBUMIN in the last 168 hours. No results for input(s): LIPASE, AMYLASE in the last 168 hours. No results for input(s): AMMONIA in the last 168 hours. Coagulation Profile: No results for input(s): INR, PROTIME in the last 168 hours. Cardiac Enzymes: No results for input(s): CKTOTAL, CKMB, CKMBINDEX, TROPONINI in the last 168 hours. BNP (last 3 results) No results for input(s): PROBNP in the last 8760 hours. HbA1C: No results for input(s): HGBA1C in the last 72 hours. CBG:  Recent Labs Lab 04/01/17 0758 04/01/17 1136 04/02/17 0759 04/03/17 0755 04/04/17 0750  GLUCAP 85 104* 97 71 93   Lipid Profile: No results for input(s): CHOL, HDL, LDLCALC, TRIG, CHOLHDL, LDLDIRECT in the last 72 hours. Thyroid Function Tests: No results for input(s): TSH, T4TOTAL, FREET4, T3FREE, THYROIDAB in the last 72 hours. Anemia Panel: No results for input(s): VITAMINB12, FOLATE, FERRITIN, TIBC, IRON, RETICCTPCT in the last 72 hours. Urine  analysis:    Component Value Date/Time   COLORURINE YELLOW 03/16/2017 1915   APPEARANCEUR CLEAR 03/16/2017 1915   LABSPEC 1.009 03/16/2017 1915   PHURINE 6.0 03/16/2017 1915   GLUCOSEU NEGATIVE 03/16/2017 1915   HGBUR SMALL (A) 03/16/2017 1915   BILIRUBINUR NEGATIVE 03/16/2017 1915   BILIRUBINUR negative 03/28/2015 1321   KETONESUR 20 (A) 03/16/2017 1915   PROTEINUR NEGATIVE 03/16/2017 1915   UROBILINOGEN 0.2 03/28/2015 1321   UROBILINOGEN 0.2 04/18/2013 1222   NITRITE NEGATIVE 03/16/2017 1915   LEUKOCYTESUR LARGE (A) 03/16/2017 1915   Sepsis Labs: @LABRCNTIP (procalcitonin:4,lacticidven:4)  )No results found for this or any previous visit (from the past 240 hour(s)).    Radiology Studies: No results found.   Scheduled Meds: . anastrozole  1 mg Oral Daily  . enoxaparin (LOVENOX) injection  40 mg Subcutaneous Q24H  . feeding supplement (ENSURE ENLIVE)  237 mL Oral TID BM  . gabapentin  600  mg Oral BID  . lamoTRIgine  200 mg Oral Daily  . loratadine  10 mg Oral Daily  . OXcarbazepine  450 mg Oral BID  . pantoprazole  40 mg Oral Daily  . potassium chloride  10 mEq Oral Daily  . QUEtiapine  100 mg Oral QHS  . sodium chloride flush  10-40 mL Intracatheter Q12H  . sodium chloride flush  10-40 mL Intracatheter Q12H  . sodium chloride flush  3 mL Intravenous Q12H  . traZODone  200 mg Oral QHS   Continuous Infusions: . sodium chloride    . sodium chloride       LOS: 18 days    Time spent: 30 min    Janece Canterbury, MD [facility] Pager [phone]  If 7PM-7AM, please contact night-coverage [email or web address] Password [email or web address] 04/04/2017, 12:35 PM

## 2017-04-04 NOTE — Consult Note (Signed)
[name]   Reason for Hunter:  Medication management for Bipolar disorder Referring Physician:  Dr.  Sheran Fava Patient Identification: [name] MRN:  [id number] Principal Diagnosis: Acute encephalopathy Diagnosis:   Patient Active Problem List   Diagnosis Date Noted  . Bipolar I disorder, most recent episode depressed ([place]) [F31.30] 12/08/2016    Priority: Low  . Acute encephalopathy [G93.40] 03/16/2017  . Acute lower UTI [N39.0]   . Hypokalemia [E87.6] 02/18/2017  . Gait abnormality [R26.9] 01/06/2017  . Adjustment disorder with anxiety [F43.22] 12/06/2016  . Delirium due to another medical condition [F05] 12/03/2016  . Confusion [R41.0] 12/03/2016  . Diarrhea [R19.7] 12/03/2016  . Metastatic breast cancer ([place]) [C50.919]   . Cerumen impaction [H61.20] 11/25/2016  . Otitis media [H66.90] 11/06/2016  . Brain metastasis ([place]) [C79.31] 07/27/2016  . Iron deficiency anemia [D50.9] 06/26/2016  . Bone metastases ([place]) [C79.51] 06/03/2016  . Primary cancer of lower-inner quadrant of left female breast ([place]) [C50.312] 06/01/2016  . Pap smear for cervical cancer screening [Z12.4] 03/28/2015  . Current smoker [F17.200] 03/28/2015  . Healthcare maintenance [Z00.00] 03/28/2015  . Seasonal allergies [J30.2] 03/28/2015  . Anxiety state [F41.1] 02/28/2015  . Fibromyalgia [M79.7] 02/28/2015  . Family history of diabetes mellitus [Z83.3] 02/28/2015  . H/O alcohol abuse [Z87.898]     Total Time spent with patient: 45 minutes  Subjective:   [name] is a 56 y.o. female patient admitted with confusion, dizziness and drowsiness.  HPI: Thanks for asking me to do a medication management psychiatric consultation on Ms [name]. Patient reports that she has been feeling confused, dizzy and drowsy on her current medication regimen. She is emotionally fragile, met her in the room ruminating about her multiple health problem and telling me how much she regretted divorcing  her ex-husband. Patient reports decreased mood swings, denies psychosis, SI/HI, delusional thinking and requesting for her medications to be reduced. Patient reports that she has been in and out of hospital in the last few months and has finally agreed that she needs placement in a facility who can take care of her medical, physical and mental need.  Past Psychiatric History:  Bipolar depression, Anxiety disorder, Delirium  Risk to Self: Suicidal Ideation: No (DENIES) Suicidal Intent: No Is patient at risk for suicide?: No Suicidal Plan?: No Access to Means: No (UNRESPONSIVE- PREVIOUSLY REPORTED NO ACCESS) What has been your use of drugs/alcohol within the last 12 months?:  (DENIES USE ALTHOUGH HX OF POLYSUBSTANCE DEPENDENCE) How many times?:  (0) Other Self Harm Risks:  (NONE REPORTED) Triggers for Past Attempts: None known Intentional Self Injurious Behavior: None Risk to Others: Homicidal Ideation: No (DENIES) Thoughts of Harm to Others: No (DENIES) Current Homicidal Intent: No Current Homicidal Plan: No Access to Homicidal Means: No Identified Victim:  (NA) History of harm to others?: No (NONE REPORTED) Assessment of Violence: None Noted Violent Behavior Description:  (NA) Does patient have access to weapons?: No Criminal Charges Pending?: No Does patient have a court date: No Prior Inpatient Therapy: Prior Inpatient Therapy: Yes Prior Therapy Dates:  (2017, 2018) Prior Therapy Facilty/Provider(s):  [facility]) Reason for Treatment:  (PER PT RECORD, BIPOLAR D/O) Prior Outpatient Therapy: Prior Outpatient Therapy: Yes Prior Therapy Dates:  (2017 PER PT RECORD) Prior Therapy Facilty/Provider(s):  ([facility] PER PT RECORD) Reason for Treatment:  (BIPOLAR D/O PER PT RECORD) Does patient have an ACCT team?: No Does patient have Intensive In-House Services?  : No Does patient have [facility] services? : No Does  patient have P4CC services?: No  Past Medical History:  Past  Medical History:  Diagnosis Date  . Alcohol abuse   . Anemia    during chemo  . Anxiety    At age 56  . Arthritis Dx 2010  . Bipolar disorder ([place])   . Cancer ([place])    breast mets to brain  . Chronic pain   . Complication of anesthesia   . Depression   . Fibromyalgia Dx 2005  . GERD (gastroesophageal reflux disease)   . Headache    hx  migraines  . Opiate dependence ([place])   . PONV (postoperative nausea and vomiting)   . Port-a-cath in place   . PTSD (post-traumatic stress disorder)     Past Surgical History:  Procedure Laterality Date  . APPLICATION OF CRANIAL NAVIGATION N/A 08/14/2016   Procedure: APPLICATION OF CRANIAL NAVIGATION;  Surgeon: Erline Levine, MD;  Location: [place] [facility];  Service: Neurosurgery;  Laterality: N/A;  . BREAST RECONSTRUCTION Left    with silicone implant  . CRANIOTOMY N/A 08/14/2016   Procedure: CRANIOTOMY TUMOR EXCISION WITH Lucky Rathke;  Surgeon: Erline Levine, MD;  Location: [place] NEUR[facility] OR[facility];  Service: Neurosurgery;  Laterality: N/A;  . FIBULA FRACTURE SURGERY Left   . MASTECTOMY Left   . RADIOLOGY WITH ANESTHESIA N/A 07/23/2016   Procedure: MRI OF BRAIN WITH AND WITHOUT;  Surgeon: Medication Radiologist, MD;  Location: [place];  Service: Radiology;  Laterality: N/A;  . RADIOLOGY WITH ANESTHESIA N/A 09/08/2016   Procedure: MRI OF BRAIN WITH AND WITHOUT CONTRAST;  Surgeon: Medication Radiologist, MD;  Location: [place];  Service: Radiology;  Laterality: N/A;  . RADIOLOGY WITH ANESTHESIA N/A 12/10/2016   Procedure: MRI OF BRAIN WITH AND WITHOUT;  Surgeon: Medication Radiologist, MD;  Location: [place];  Service: Radiology;  Laterality: N/A;  . RADIOLOGY WITH ANESTHESIA N/A 03/02/2017   Procedure: MRI of BRAIN W and W/OUT CONTRAST;  Surgeon: Medication Radiologist, MD;  Location: [place];  Service: Radiology;  Laterality: N/A;  . right power port placement Right    Family History:  Family History  Problem Relation Age of Onset  . Diabetes Mother   . Bipolar disorder  Mother   . CAD Father    Family Psychiatric  History: Noncontributory Social History:  History  Alcohol Use No    Comment: no ETOH since 08/22/12     History  Drug Use No    Comment: prescription opiates from the street daily until Sunday    Social History   Social History  . Marital status: Single    Spouse name: N/A  . Number of children: N/A  . Years of education: N/A   Social History Main Topics  . Smoking status: Current Every Day Smoker    Packs/day: 0.25    Types: Cigarettes  . Smokeless tobacco: Never Used     Comment: Pt is on Chantix at present time  . Alcohol use No     Comment: no ETOH since 08/22/12  . Drug use: No     Comment: prescription opiates from the street daily until Sunday  . Sexual activity: No     Comment: ablation   Other Topics Concern  . None   Social History Narrative  . None   Additional Social History:    Allergies:   Allergies  Allergen Reactions  . Demerol Itching and Nausea And Vomiting  . Erythromycin Rash    Labs:  Results for orders placed or performed during the hospital encounter  of 03/16/17 (from the past 48 hour(s))  Glucose, capillary     Status: None   Collection Time: 04/03/17  7:55 AM  Result Value Ref Range   Glucose-Capillary 71 65 - 99 mg/dL  Glucose, capillary     Status: None   Collection Time: 04/04/17  7:50 AM  Result Value Ref Range   Glucose-Capillary 93 65 - 99 mg/dL    Current Facility-Administered Medications  Medication Dose Route Frequency Provider Last Rate Last Dose  . 0.9 %  sodium chloride infusion  250 mL Intravenous PRN Opyd, Ilene Qua, MD      . 0.9 %  sodium chloride infusion   Intravenous Once PRN Magrinat, Virgie Dad, MD      . albuterol (PROVENTIL) (2.5 MG/3ML) 0.083% nebulizer solution 2.5 mg  2.5 mg Nebulization Once PRN Magrinat, Virgie Dad, MD      . anastrozole (ARIMIDEX) tablet 1 mg  1 mg Oral Daily Opyd, Ilene Qua, MD   1 mg at 04/04/17 1145  . enoxaparin (LOVENOX) injection 40  mg  40 mg Subcutaneous Q24H Opyd, Ilene Qua, MD   40 mg at 04/04/17 1144  . EPINEPHrine (ADRENALIN) 0.5 mg  0.5 mg Subcutaneous Once PRN Magrinat, Virgie Dad, MD      . EPINEPHrine (ADRENALIN) 0.5 mg  0.5 mg Subcutaneous Once PRN Magrinat, Virgie Dad, MD      . EPINEPHrine (ADRENALIN) 1 MG/10ML injection 0.25 mg  0.25 mg Intravenous Once PRN Magrinat, Virgie Dad, MD      . EPINEPHrine (ADRENALIN) 1 MG/10ML injection 0.25 mg  0.25 mg Intravenous Once PRN Magrinat, Virgie Dad, MD      . feeding supplement (ENSURE ENLIVE) (ENSURE ENLIVE) liquid 237 mL  237 mL Oral TID BM Oswald Hillock, MD   237 mL at 04/02/17 1257  . gabapentin (NEURONTIN) capsule 300 mg  300 mg Oral BID Ayo Guarino, MD      . gi cocktail (Maalox,Lidocaine,Donnatal)  30 mL Oral BID PRN Nita Sells, MD      . haloperidol lactate (HALDOL) injection 2 mg  2 mg Intravenous Q6H PRN Janece Canterbury, MD   2 mg at 04/01/17 1033  . ibuprofen (ADVIL,MOTRIN) tablet 800 mg  800 mg Oral Q6H PRN Opyd, Ilene Qua, MD   800 mg at 04/04/17 1145  . lamoTRIgine (LAMICTAL) tablet 200 mg  200 mg Oral Daily Ambrose Finland, MD   200 mg at 04/04/17 1145  . lidocaine-prilocaine (EMLA) cream 1 application  1 application Topical PRN Opyd, Ilene Qua, MD      . loratadine (CLARITIN) tablet 10 mg  10 mg Oral Daily Opyd, Ilene Qua, MD   10 mg at 04/04/17 1143  . LORazepam (ATIVAN) tablet 1 mg  1 mg Oral Q4H PRN Nita Sells, MD   1 mg at 04/03/17 1858  . magic mouthwash  5 mL Oral QID PRN Opyd, Ilene Qua, MD      . methylPREDNISolone sodium succinate (SOLU-MEDROL) 125 mg/2 mL injection 125 mg  125 mg Intravenous Once PRN Magrinat, Virgie Dad, MD      . metoCLOPramide (REGLAN) injection 10 mg  10 mg Intravenous Q8H PRN Nita Sells, MD   10 mg at 03/27/17 1341  . Oxcarbazepine (TRILEPTAL) tablet 300 mg  300 mg Oral BID Yahmir Sokolov, MD      . pantoprazole (PROTONIX) EC tablet 40 mg  40 mg Oral Daily Opyd, Ilene Qua, MD   40 mg at  04/04/17 1143  . potassium chloride SA (  K-DUR,KLOR-CON) CR tablet 10 mEq  10 mEq Oral Daily Opyd, Timothy S, MD   10 mEq at 04/04/17 1143  . QUEtiapine (SEROQUEL) tablet 50 mg  50 mg Oral QHS , , MD      . sodium chloride flush (NS) 0.9 % injection 10-40 mL  10-40 mL Intracatheter Q12H Lama, Gagan S, MD   10 mL at 03/27/17 0451  . sodium chloride flush (NS) 0.9 % injection 10-40 mL  10-40 mL Intracatheter Q12H Samtani, Jai-Gurmukh, MD   10 mL at 04/01/17 1000  . sodium chloride flush (NS) 0.9 % injection 10-40 mL  10-40 mL Intracatheter PRN Samtani, Jai-Gurmukh, MD   10 mL at 04/03/17 1637  . sodium chloride flush (NS) 0.9 % injection 3 mL  3 mL Intravenous Q12H Opyd, Timothy S, MD   3 mL at 04/04/17 1000  . sodium chloride flush (NS) 0.9 % injection 3 mL  3 mL Intravenous PRN Opyd, Timothy S, MD      . traMADol (ULTRAM) tablet 50 mg  50 mg Oral Q12H PRN Opyd, Timothy S, MD   50 mg at 04/04/17 1143  . traZODone (DESYREL) tablet 200 mg  200 mg Oral QHS Opyd, Timothy S, MD   200 mg at 04/03/17 2207    Musculoskeletal: Strength & Muscle Tone: decreased Gait & Station: unsteady Patient leans: N/A  Psychiatric Specialty Exam: Physical Exam  Psychiatric: Judgment and thought content normal. Her mood appears anxious. Her speech is slurred. She is slowed. Cognition and memory are impaired.   as per history and physical   ROS generalized weakness, shaking both upper arms and right shoulder and questionable lithium toxicity even though this seems to be therapeutic range. Patient continued to be confused and not able to answer most of the questions.  No Fever-chills, No Headache, No changes with Vision or hearing, reports vertigo No problems swallowing food or Liquids, No Chest pain, Cough or Shortness of Breath, No Abdominal pain, No Nausea or Vommitting, Bowel movements are regular, No Blood in stool or Urine, No dysuria, No new skin rashes or bruises, No new joints pains-aches,   No new weakness, tingling, numbness in any extremity, No recent weight gain or loss, No polyuria, polydypsia or polyphagia,  A full 10 point Review of Systems was done, except as stated above, all other Review of Systems were negative.   Blood pressure 107/63, pulse 93, temperature 97.7 F (36.5 C), temperature source Oral, resp. rate 16, height 5' 5" (1.651 m), weight 54.9 kg (121 lb), SpO2 96 %.Body mass index is 20.14 kg/m.  General Appearance: Casual  Eye Contact:  Fair  Speech:  Pressured, Slow and Slurred  Volume:  Decreased  Mood:  Anxious and Dysphoric  Affect:  Constricted  Thought Process:  Disorganized  Orientation:  Other:  Oriented to person and place only  Thought Content:  Illogical, Rumination and Tangential  Suicidal Thoughts:  No  Homicidal Thoughts:  No  Memory:  Immediate;   Fair Recent;   Poor Remote;   Fair  Judgement:  Impaired  Insight:  Shallow  Psychomotor Activity:  Decreased and Restlessness  Concentration:  Concentration: Fair and Attention Span: Poor  Recall:  Fair  Fund of Knowledge:  Fair  Language:  Good  Akathisia:  Negative  Handed:  Right  AIMS (if indicated):     Assets:  Desire for Improvement Housing Leisure Time Resilience  ADL's:  Impaired  Cognition:  Impaired,  Moderate  Sleep:          Treatment Plan Summary:  55 years old female with bipolar disorder and Anxiety who was admitted due to confusion, disorientation, dizziness and drowsiness.      Plan/Recommendations: Decrease Trileptal to 300 mg bid, Seroquel to 5 mg Qhs and Gabapentin to 300 mg bid due to excessive sedation, dizziness and confusion. Continue Lamotrigine 200 mg PO QD for mood stabilization  Consider further decrease of the above medications if there is no improvement in a week.  Disposition:  Patient will be better served with the appropriate medication management and placement in a skilled nursing facility with rehabilitation. Patient does not meet  criteria for psychiatric inpatient admission. Supportive therapy provided about ongoing stressors.   , , MD 04/04/2017 3:06 PM    

## 2017-04-05 ENCOUNTER — Encounter: Payer: Self-pay | Admitting: General Practice

## 2017-04-05 DIAGNOSIS — R42 Dizziness and giddiness: Secondary | ICD-10-CM

## 2017-04-05 DIAGNOSIS — R400 Somnolence: Secondary | ICD-10-CM

## 2017-04-05 NOTE — Progress Notes (Signed)
Physical Therapy Treatment Patient Details Name: [name] MRN: [id number] DOB: 02-22-1961 Today's Date: 04/05/2017    History of Present Illness Pt is a 56 y/o female presented to the ED with confusion and hallucinations. Of note, pt recently d/c'd from [facility] on 03/01/17 for similar issues. 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    PT Comments    Goals re-assessed today.  Pt continues with LOB and staggering to the left requiring up to mod assist without an assistive device for gait in the hallway.  Tried to work on stimulating her vestibular system with repeated head movements and body movements in the hallway.  She did not seem as symptomatic (dizzy or fatigued) after this PM's session (last time I saw her she didn't feel good after walking and needed to lay down, today, she just felt fatigue and kept sitting up after we were done).  PT will continue to follow acutely.  Berg next session if able.   Follow Up Recommendations  SNF;Supervision/Assistance - 24 hour     Equipment Recommendations  Rolling walker with 5" wheels    Recommendations for Other Services   NA     Precautions / Restrictions Precautions Precautions: Fall Precaution Comments: impulsive, decreased safety awareness, left lateral lean, LOB, list.  Posey belt and chair alarm.  Restrictions Weight Bearing Restrictions: No Other Position/Activity Restrictions: per IV team pt. only allowed to sponge bathe, no showers at this time    Mobility  Bed Mobility               General bed mobility comments: Pt was OOB in the chair.   Transfers Overall transfer level: Needs assistance Equipment used: None Transfers: Sit to/from Stand Sit to Stand: Supervision         General transfer comment: supervision for safety, pt transitioned from sit to stand well today.   Ambulation/Gait Ambulation/Gait assistance: Min guard;Mod assist Ambulation  Distance (Feet): 300 Feet Assistive device: None Gait Pattern/deviations: Step-through pattern;Staggering left;Drifts right/left Gait velocity: decreased Gait velocity interpretation: Below normal speed for age/gender General Gait Details: PT stood on pt's left side as she has significant tendancy to drift, lean, LOB left especially when attempting to multi task.  With horizontal head position changes during gait she almost fell left requiring mod assist to correct LOB.  Pt did better with vertical head movements, however, she did not move her head far with these transitions.  Attempted obstacle step over and weaving around the floor tiles as well as turn and stop both directions with up to mod assist.           Balance Overall balance assessment: Needs assistance Sitting-balance support: Feet supported;No upper extremity supported Sitting balance-Leahy Scale: Normal Sitting balance - Comments: was able to reach down to the floor and sit back up without LOB.    Standing balance support: No upper extremity supported;Single extremity supported Standing balance-Leahy Scale: Fair Standing balance comment: static standing mostly supervision, dynamic can be min guard up to mod assist.                             Cognition Arousal/Alertness: Awake/alert Behavior During Therapy: Impulsive Overall Cognitive Status: Impaired/Different from baseline Area of Impairment: Memory;Orientation;Following commands;Safety/judgement;Awareness;Problem solving                 Orientation Level: Disoriented to;Time Current Attention Level: Selective Memory: Decreased  recall of precautions;Decreased short-term memory Following Commands: Follows one step commands consistently Safety/Judgement: Decreased awareness of safety;Decreased awareness of deficits Awareness: Intellectual Problem Solving: Slow processing;Decreased initiation;Difficulty sequencing;Requires verbal cues;Requires tactile  cues General Comments: Pt is aware her birthday is this week, but had to self correct the date, and could not figure out what day it would be.  She continues to have poor awareness of her balance deficits stating that she is getting around well by herself.               Pertinent Vitals/Pain Pain Assessment: No/denies pain Pain Score: 0-No pain           PT Goals (current goals can now be found in the care plan section) Acute Rehab PT Goals Patient Stated Goal: to get out of the hospital, move to [place] with her son PT Goal Formulation:  (goals re assessed) Time For Goal Achievement: 04/19/17 Potential to Achieve Goals: Fair Progress towards PT goals: Progressing toward goals    Frequency    Min 3X/week      PT Plan Current plan remains appropriate       AM-PAC PT "6 Clicks" Daily Activity  Outcome Measure  Difficulty turning over in bed (including adjusting bedclothes, sheets and blankets)?: None Difficulty moving from lying on back to sitting on the side of the bed? : None Difficulty sitting down on and standing up from a chair with arms (e.g., wheelchair, bedside commode, etc,.)?: None Help needed moving to and from a bed to chair (including a wheelchair)?: A Little Help needed walking in hospital room?: A Little Help needed climbing 3-5 steps with a railing? : A Little 6 Click Score: 21    End of Session Equipment Utilized During Treatment: Gait belt Activity Tolerance: Patient limited by fatigue Patient left: in chair;with call bell/phone within reach;with chair alarm set;with restraints reapplied   PT Visit Diagnosis: Unsteadiness on feet (R26.81);Other abnormalities of gait and mobility (R26.89);Muscle weakness (generalized) (M62.81)     Time: 7680-8811  Charges: 1 re-eval PT Time Calculation (min) (ACUTE ONLY): 18 min Brylea Pita B. [place], [place], DPT [phone]   04/05/2017, 6:20 PM

## 2017-04-05 NOTE — Progress Notes (Signed)
Occupational Therapy Treatment Patient Details Name: [name] MRN: [id number] DOB: [birth year] Today's Date: 04/05/2017    History of present illness Pt is a 56 y/o female presented to the ED with confusion and hallucinations. Of note, pt recently d/c'd from [facility] on 03/01/17 for similar issues. 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   OT comments  This 56 yo female admitted with above presents to acute OT making progress with basic ADLs (currently at a min guard A level) due to decreased safety awareness and balance. She will continue to benefit from acute OT with follow up OT at SNF.  Follow Up Recommendations  SNF;Supervision/Assistance - 24 hour    Equipment Recommendations  Other (comment) (TBD next venue)       Precautions / Restrictions Precautions Precautions: Fall Precaution Comments: impulsive (decreased STM) Restrictions Weight Bearing Restrictions: No Other Position/Activity Restrictions: per IV team pt. only allowed to sponge bathe, no showers at this time       Mobility Bed Mobility Overal bed mobility: Modified Independent             General bed mobility comments: Can do this independently, but needs S due to decreased safety awareness  Transfers Overall transfer level: Needs assistance   Transfers: Sit to/from Stand Sit to Stand: Min guard              Balance Overall balance assessment: Needs assistance Sitting-balance support: No upper extremity supported;Feet unsupported Sitting balance-Leahy Scale: Normal     Standing balance support: No upper extremity supported;During functional activity Standing balance-Leahy Scale: Fair Standing balance comment: standing at sink groom and bath (min guard A due to balance)                           ADL either performed or assessed with clinical judgement   ADL Overall ADL's : Needs assistance/impaired     Grooming:  Wash/dry hands;Wash/dry face;Oral care;Standing;Min guard   Upper Body Bathing: Min guard;Standing   Lower Body Bathing: Min guard;Sit to/from stand   Upper Body Dressing : Sitting   Lower Body Dressing: Min guard;Sit to/from stand   Toilet Transfer: Min guard;Ambulation   Toileting- Clothing Manipulation and Hygiene: Min guard;Sit to/from stand                         Cognition Arousal/Alertness: Awake/alert Behavior During Therapy: Impulsive Overall Cognitive Status: No family/caregiver present to determine baseline cognitive functioning Area of Impairment: Memory;Following commands;Safety/judgement                     Memory: Decreased short-term memory Following Commands: Follows one step commands consistently Safety/Judgement: Decreased awareness of safety;Decreased awareness of deficits     General Comments: Pt was fairly "on top of things" today. She was using the phone to follow up on an appointment she has this week, she was able to tell me the clothes she had in the closet before I went to get them. The only safety issue was STM (she was told not to get up from recliner x2 when I left the room and she did both times (setting off the alarm) and she has mild balance deficits.                   Pertinent Vitals/ Pain       Pain Assessment: Faces Faces Pain  Scale: Hurts little more Pain Location: head and back Pain Descriptors / Indicators: Aching;Headache Pain Intervention(s): Monitored during session;Patient requesting pain meds-RN notified         Frequency  Min 2X/week        Progress Toward Goals  OT Goals(current goals can now be found in the care plan section)  Progress towards OT goals: Progressing toward goals     Plan Discharge plan remains appropriate       AM-PAC PT "6 Clicks" Daily Activity     Outcome Measure   Help from another person eating meals?: None Help from another person taking care of personal grooming?: A  Little Help from another person toileting, which includes using toliet, bedpan, or urinal?: A Little Help from another person bathing (including washing, rinsing, drying)?: A Little Help from another person to put on and taking off regular upper body clothing?: A Little Help from another person to put on and taking off regular lower body clothing?: A Little 6 Click Score: 19    End of Session Equipment Utilized During Treatment:  (none)  OT Visit Diagnosis: Muscle weakness (generalized) (M62.81);Other abnormalities of gait and mobility (R26.89);Cognitive communication deficit (R41.841)   Activity Tolerance Patient tolerated treatment well   Patient Left in chair;with call bell/phone within reach;with chair alarm set   Nurse Communication Patient requests pain meds        Time: 2505-3976 OT Time Calculation (min): 47 min  Charges: OT General Charges $OT Visit: 1 Procedure OT Treatments $Self Care/Home Management : 38-52 mins  [place] [address], OTR/L [phone] 04/05/2017

## 2017-04-05 NOTE — Consult Note (Signed)
[facility] Consult   Reason for Consult:  Medication management for Bipolar disorder Referring Physician:  Dr.  Sheran Fava Patient Identification: [name] MRN:  [id number] Principal Diagnosis: Acute encephalopathy Diagnosis:   Patient Active Problem List   Diagnosis Date Noted  . Acute encephalopathy [G93.40] 03/16/2017  . Acute lower UTI [N39.0]   . Hypokalemia [E87.6] 02/18/2017  . Gait abnormality [R26.9] 01/06/2017  . Bipolar I disorder, most recent episode depressed ([place]) [F31.30] 12/08/2016  . Adjustment disorder with anxiety [F43.22] 12/06/2016  . Delirium due to another medical condition [F05] 12/03/2016  . Confusion [R41.0] 12/03/2016  . Diarrhea [R19.7] 12/03/2016  . Metastatic breast cancer ([place]) [C50.919]   . Cerumen impaction [H61.20] 11/25/2016  . Otitis media [H66.90] 11/06/2016  . Brain metastasis ([place]) [C79.31] 07/27/2016  . Iron deficiency anemia [D50.9] 06/26/2016  . Bone metastases ([place]) [C79.51] 06/03/2016  . Primary cancer of lower-inner quadrant of left female breast ([place]) [C50.312] 06/01/2016  . Pap smear for cervical cancer screening [Z12.4] 03/28/2015  . Current smoker [F17.200] 03/28/2015  . Healthcare maintenance [Z00.00] 03/28/2015  . Seasonal allergies [J30.2] 03/28/2015  . Anxiety state [F41.1] 02/28/2015  . Fibromyalgia [M79.7] 02/28/2015  . Family history of diabetes mellitus [Z83.3] 02/28/2015  . H/O alcohol abuse [Z87.898]     Total Time spent with patient: 45 minutes  Subjective:   [name] is a 56 y.o. female patient admitted with confusion, dizziness and drowsiness.  HPI: Thanks for asking me to do a medication management psychiatric consultation on Ms [name]. Patient reports that she has been feeling confused, dizzy and drowsy on her current medication regimen. She is emotionally fragile, met her in the room ruminating about her multiple health problem and telling me how much she regretted divorcing her ex-husband.  Patient reports decreased mood swings, denies psychosis, SI/HI, delusional thinking and requesting for her medications to be reduced. Patient reports that she has been in and out of hospital in the last few months and has finally agreed that she needs placement in a facility who can take care of her medical, physical and mental need.  Past Psychiatric History:  Bipolar depression, Anxiety disorder, Delirium  04/05/2017 Interval history: Patient seen for psych consultation follow up today and reviewed consult note from Dr. Darleene Cleaver. Patient appeared sitting in a chair next to her bed, awake, alert, oriented to herself and being in hospital. She has been worried about not having an insurance for out of placement and also if she is going to have a rehabilitation and able to like the placement or not. She is comfortable when informed that she can give a trail and if she does not like she can request to case manager to change the placement again. She feels she has safe place and bed room at home but very few people can support her at home. She has less emotional this morning and more anxious about post hospitalization support services. She has no safety concerns or agitation.    Risk to Self: Suicidal Ideation: No (DENIES) Suicidal Intent: No Is patient at risk for suicide?: No Suicidal Plan?: No Access to Means: No (UNRESPONSIVE- PREVIOUSLY REPORTED NO ACCESS) What has been your use of drugs/alcohol within the last 12 months?:  (DENIES USE ALTHOUGH HX OF POLYSUBSTANCE DEPENDENCE) How many times?:  (0) Other Self Harm Risks:  (NONE REPORTED) Triggers for Past Attempts: None known Intentional Self Injurious Behavior: None Risk to Others: Homicidal Ideation: No (DENIES) Thoughts of Harm to Others: No (DENIES) Current Homicidal Intent:  No Current Homicidal Plan: No Access to Homicidal Means: No Identified Victim:  (NA) History of harm to others?: No (NONE REPORTED) Assessment of Violence: None  Noted Violent Behavior Description:  (NA) Does patient have access to weapons?: No Criminal Charges Pending?: No Does patient have a court date: No Prior Inpatient Therapy: Prior Inpatient Therapy: Yes Prior Therapy Dates:  (2017, 2018) Prior Therapy Facilty/Provider(s):  [facility]) Reason for Treatment:  (PER PT RECORD, BIPOLAR D/O) Prior Outpatient Therapy: Prior Outpatient Therapy: Yes Prior Therapy Dates:  (2017 PER PT RECORD) Prior Therapy Facilty/Provider(s):  ([facility] PER PT RECORD) Reason for Treatment:  (BIPOLAR D/O PER PT RECORD) Does patient have an ACCT team?: No Does patient have Intensive In-House Services?  : No Does patient have [facility] services? : No Does patient have P4CC services?: No  Past Medical History:  Past Medical History:  Diagnosis Date  . Alcohol abuse   . Anemia    during chemo  . Anxiety    At age 82  . Arthritis Dx 2010  . Bipolar disorder ([name])   . Cancer ([place])    breast mets to brain  . Chronic pain   . Complication of anesthesia   . Depression   . Fibromyalgia Dx 2005  . GERD (gastroesophageal reflux disease)   . Headache    hx  migraines  . Opiate dependence ([place])   . PONV (postoperative nausea and vomiting)   . Port-a-cath in place   . PTSD (post-traumatic stress disorder)     Past Surgical History:  Procedure Laterality Date  . APPLICATION OF CRANIAL NAVIGATION N/A 08/14/2016   Procedure: APPLICATION OF CRANIAL NAVIGATION;  Surgeon: Erline Levine, MD;  Location: [facility];  Service: Neurosurgery;  Laterality: N/A;  . BREAST RECONSTRUCTION Left    with silicone implant  . CRANIOTOMY N/A 08/14/2016   Procedure: CRANIOTOMY TUMOR EXCISION WITH Lucky Rathke;  Surgeon: Erline Levine, MD;  Location: [facility];  Service: Neurosurgery;  Laterality: N/A;  . FIBULA FRACTURE SURGERY Left   . MASTECTOMY Left   . RADIOLOGY WITH ANESTHESIA N/A 07/23/2016   Procedure: MRI OF BRAIN WITH AND WITHOUT;  Surgeon: Medication Radiologist, MD;   Location: [facility];  Service: Radiology;  Laterality: N/A;  . RADIOLOGY WITH ANESTHESIA N/A 09/08/2016   Procedure: MRI OF BRAIN WITH AND WITHOUT CONTRAST;  Surgeon: Medication Radiologist, MD;  Location: [facility];  Service: Radiology;  Laterality: N/A;  . RADIOLOGY WITH ANESTHESIA N/A 12/10/2016   Procedure: MRI OF BRAIN WITH AND WITHOUT;  Surgeon: Medication Radiologist, MD;  Location: [facility];  Service: Radiology;  Laterality: N/A;  . RADIOLOGY WITH ANESTHESIA N/A 03/02/2017   Procedure: MRI of BRAIN W and W/OUT CONTRAST;  Surgeon: Medication Radiologist, MD;  Location: [facility];  Service: Radiology;  Laterality: N/A;  . right power port placement Right    Family History:  Family History  Problem Relation Age of Onset  . Diabetes Mother   . Bipolar disorder Mother   . CAD Father    Family Psychiatric  History: Noncontributory Social History:  History  Alcohol Use No    Comment: no ETOH since 08/22/12     History  Drug Use No    Comment: prescription opiates from the street daily until Sunday    Social History   Social History  . Marital status: Single    Spouse name: N/A  . Number of children: N/A  . Years of education: N/A   Social History Main Topics  .  Smoking status: Current Every Day Smoker    Packs/day: 0.25    Types: Cigarettes  . Smokeless tobacco: Never Used     Comment: Pt is on Chantix at present time  . Alcohol use No     Comment: no ETOH since 08/22/12  . Drug use: No     Comment: prescription opiates from the street daily until Sunday  . Sexual activity: No     Comment: ablation   Other Topics Concern  . None   Social History Narrative  . None   Additional Social History:    Allergies:   Allergies  Allergen Reactions  . Demerol Itching and Nausea And Vomiting  . Erythromycin Rash    Labs:  Results for orders placed or performed during the hospital encounter of 03/16/17 (from the past 48 hour(s))  Glucose, capillary     Status: None   Collection Time:  04/04/17  7:50 AM  Result Value Ref Range   Glucose-Capillary 93 65 - 99 mg/dL    Current Facility-Administered Medications  Medication Dose Route Frequency Provider Last Rate Last Dose  . 0.9 %  sodium chloride infusion  250 mL Intravenous PRN Opyd, Ilene Qua, MD      . 0.9 %  sodium chloride infusion   Intravenous Once PRN Magrinat, Virgie Dad, MD      . albuterol (PROVENTIL) (2.5 MG/3ML) 0.083% nebulizer solution 2.5 mg  2.5 mg Nebulization Once PRN Magrinat, Virgie Dad, MD      . anastrozole (ARIMIDEX) tablet 1 mg  1 mg Oral Daily Opyd, Ilene Qua, MD   1 mg at 04/05/17 1119  . enoxaparin (LOVENOX) injection 40 mg  40 mg Subcutaneous Q24H Opyd, Ilene Qua, MD   40 mg at 04/05/17 1121  . EPINEPHrine (ADRENALIN) 0.5 mg  0.5 mg Subcutaneous Once PRN Magrinat, Virgie Dad, MD      . EPINEPHrine (ADRENALIN) 0.5 mg  0.5 mg Subcutaneous Once PRN Magrinat, Virgie Dad, MD      . EPINEPHrine (ADRENALIN) 1 MG/10ML injection 0.25 mg  0.25 mg Intravenous Once PRN Magrinat, Virgie Dad, MD      . EPINEPHrine (ADRENALIN) 1 MG/10ML injection 0.25 mg  0.25 mg Intravenous Once PRN Magrinat, Virgie Dad, MD      . feeding supplement (ENSURE ENLIVE) (ENSURE ENLIVE) liquid 237 mL  237 mL Oral TID BM Oswald Hillock, MD   237 mL at 04/04/17 2118  . gabapentin (NEURONTIN) capsule 300 mg  300 mg Oral BID Darleene Cleaver, Mojeed, MD   300 mg at 04/05/17 1119  . gi cocktail (Maalox,Lidocaine,Donnatal)  30 mL Oral BID PRN Nita Sells, MD      . haloperidol lactate (HALDOL) injection 2 mg  2 mg Intravenous Q6H PRN Janece Canterbury, MD   2 mg at 04/01/17 1033  . ibuprofen (ADVIL,MOTRIN) tablet 800 mg  800 mg Oral Q6H PRN Opyd, Ilene Qua, MD   800 mg at 04/05/17 1025  . lamoTRIgine (LAMICTAL) tablet 200 mg  200 mg Oral Daily Ambrose Finland, MD   200 mg at 04/05/17 1118  . lidocaine-prilocaine (EMLA) cream 1 application  1 application Topical PRN Opyd, Ilene Qua, MD      . loratadine (CLARITIN) tablet 10 mg  10 mg Oral Daily  Opyd, Ilene Qua, MD   10 mg at 04/05/17 1118  . LORazepam (ATIVAN) tablet 1 mg  1 mg Oral Q4H PRN Nita Sells, MD   1 mg at 04/05/17 1025  . magic mouthwash  5 mL  Oral QID PRN Opyd, Ilene Qua, MD      . methylPREDNISolone sodium succinate (SOLU-MEDROL) 125 mg/2 mL injection 125 mg  125 mg Intravenous Once PRN Magrinat, Virgie Dad, MD      . metoCLOPramide (REGLAN) injection 10 mg  10 mg Intravenous Q8H PRN Nita Sells, MD   10 mg at 03/27/17 1341  . Oxcarbazepine (TRILEPTAL) tablet 300 mg  300 mg Oral BID Darleene Cleaver, Mojeed, MD   300 mg at 04/05/17 1119  . pantoprazole (PROTONIX) EC tablet 40 mg  40 mg Oral Daily Opyd, Ilene Qua, MD   40 mg at 04/05/17 1120  . potassium chloride SA (K-DUR,KLOR-CON) CR tablet 10 mEq  10 mEq Oral Daily Opyd, Ilene Qua, MD   10 mEq at 04/05/17 1120  . QUEtiapine (SEROQUEL) tablet 50 mg  50 mg Oral QHS Akintayo, Mojeed, MD   50 mg at 04/04/17 2119  . sodium chloride flush (NS) 0.9 % injection 10-40 mL  10-40 mL Intracatheter Q12H Oswald Hillock, MD   10 mL at 04/04/17 2123  . sodium chloride flush (NS) 0.9 % injection 10-40 mL  10-40 mL Intracatheter Q12H Nita Sells, MD   10 mL at 04/04/17 2122  . sodium chloride flush (NS) 0.9 % injection 10-40 mL  10-40 mL Intracatheter PRN Nita Sells, MD   10 mL at 04/04/17 2121  . sodium chloride flush (NS) 0.9 % injection 3 mL  3 mL Intravenous Q12H Opyd, Ilene Qua, MD   3 mL at 04/05/17 1122  . sodium chloride flush (NS) 0.9 % injection 3 mL  3 mL Intravenous PRN Opyd, Ilene Qua, MD   3 mL at 04/04/17 2120  . traMADol (ULTRAM) tablet 50 mg  50 mg Oral Q12H PRN Opyd, Ilene Qua, MD   50 mg at 04/05/17 1025  . traZODone (DESYREL) tablet 200 mg  200 mg Oral QHS Opyd, Ilene Qua, MD   200 mg at 04/04/17 2213    Musculoskeletal: Strength & Muscle Tone: decreased Gait & Station: unsteady Patient leans: N/A  Psychiatric Specialty Exam: Physical Exam  Psychiatric: Judgment and thought content normal.  Her mood appears anxious. Her speech is slurred. She is slowed. Cognition and memory are impaired.   as per history and physical   ROS generalized weakness and mild confusion.   No Fever-chills, No Headache, No changes with Vision or hearing, reports vertigo No problems swallowing food or Liquids, No Chest pain, Cough or Shortness of Breath, No Abdominal pain, No Nausea or Vommitting, Bowel movements are regular, No Blood in stool or Urine, No dysuria, No new skin rashes or bruises, No new joints pains-aches,  No new weakness, tingling, numbness in any extremity, No recent weight gain or loss, No polyuria, polydypsia or polyphagia,  A full 10 point Review of Systems was done, except as stated above, all other Review of Systems were negative.   Blood pressure (!) 95/55, pulse 82, temperature 98.7 F (37.1 C), temperature source Oral, resp. rate 18, height 5' 5"  (1.651 m), weight 55 kg (121 lb 3.2 oz), SpO2 98 %.Body mass index is 20.17 kg/m.  General Appearance: Casual  Eye Contact:  Fair  Speech:  Pressured, Slow and Slurred  Volume:  Decreased  Mood:  Anxious and Dysphoric  Affect:  Constricted  Thought Process:  Disorganized  Orientation:  Other:  Oriented to person and place only  Thought Content:  Illogical, Rumination and Tangential  Suicidal Thoughts:  No  Homicidal Thoughts:  No  Memory:  Immediate;  Fair Recent;   Poor Remote;   Fair  Judgement:  Impaired  Insight:  Shallow  Psychomotor Activity:  Decreased and Restlessness  Concentration:  Concentration: Fair and Attention Span: Poor  Recall:  [employer] of Knowledge:  Fair  Language:  Good  Akathisia:  Negative  Handed:  Right  AIMS (if indicated):     Assets:  Desire for Improvement Housing Leisure Time Resilience  ADL's:  Impaired  Cognition:  Impaired,  Moderate  Sleep:        Treatment Plan Summary:  56 years old female with bipolar disorder and Anxiety who was admitted due to confusion,  disorientation, but no dizziness and drowsiness since her medication has adjusted.    Plan/Recommendations: Continue Trileptal to 300 mg bid for mood swings,  Continue Seroquel to 50 mg Qhs for mood and insomnia Continue Gabapentin to 300 mg bid for neuropathic pain. Continue Lamotrigine 200 mg PO QD for mood stabilization    Disposition:  Patient will be better served with the appropriate medication management Recommend placement in a skilled nursing facility with rehabilitation. Supportive therapy provided about ongoing stressors.   Ambrose Finland, MD 04/05/2017 1:36 PM

## 2017-04-05 NOTE — Progress Notes (Signed)
[facility] Note  Received call from [facility] requesting help/clarification about barriers to discharge. Consulted with [name]/LCSW, reviewing chart together. Left simple voicemail on her mobile number because her room phone at [facility] was busy. Will attempt f/u call. She also knows to return call as desired.   [place], [place], [facility] Pager [phone] Voicemail [phone]

## 2017-04-05 NOTE — Progress Notes (Signed)
PROGRESS NOTE  [name]  TDV:[id number] DOB: 16-Jun-1961 DOA: 03/16/2017 PCP: Arnoldo Morale, MD  Brief Narrative:   56 year old female with history of alcohol abuse, bipolar disorder, left breast cancer with brain metastasis status post mastectomy and craniectomy with radiation to brain and breast came to ED with confusion and hallucinations.  She was treated with ceftriaxone for acute cystitis but continues to have intermittent confusion.  Patient has been seen by psychiatry and deemed not having decision-making capacity for her medical and psychiatric condition.  PASSAR completed but awaiting placement.    Assessment & Plan:   Principal Problem:   Acute encephalopathy Active Problems:   Brain metastasis (HCC)   Metastatic breast cancer (HCC)   Bipolar I disorder, most recent episode depressed ([place])   Acute lower UTI  Acute on chronic encephalopathy related to suboccipital craniectomy for resection of posterior fossa metastases in September 2017.  MRI of the brain prior to admission was stable and her head CT during this admission demonstrated no acute changes. She was treated for urinary tract infection but continued to have in her mitten episodes of confusion requiring a Air cabin crew. She was seen by psychiatry who determined that she does not have decision-making capacity regarding medical problems or disposition. -  Increased agitation and tearfulness starting on 5/2  -  Had controlled fall to mat on floor on 5/2 -  Continues to require tele sitter and has posey restraint  Sore throat, improving with GI cocktail  Acute cystitis, present at time of admission, completed treatment with ceftriaxone.  Bipolar disorder, seen by psychiatry.  Currently very tearful.  Psych has seen the patient and deemed not having decision-making capacity.  Psychiatry recommended stopping lithium.  On Trileptal 300 bid-->450 bid and Gabapentin 600 bid, lamictal 200 qd, seroquel 50 hs, trazodone 200 hs  and ativan 1 mg q4 prn -  Psychiatrist over the weekend decreased doses of trileptal, seroquel, and gabapentin -  Continue posey belt -  Continue prn haldol  Tremors, improved with cessation of lithium  History of metastatic breast cancer to brain.  Status post mastectomy and axillary node dissection, suboccipital craniectomy for resection of posterior fossa metastasis, radiation to brain and breast. Continue Arimidex and Herceptin. Patient was seen by Dr. [name] in the hospital  DVT prophylaxis:  Lovenox Code Status:  Full code Family Communication:  Patient only Disposition Plan:  PASSARR complete.  SW working on placement.   Consultants:   Psychiatry  Cardiology  Procedures:   none  Antimicrobials:  Anti-infectives    Start     Dose/Rate Route Frequency Ordered Stop   03/17/17 2000  cefTRIAXone (ROCEPHIN) 1 g in dextrose 5 % 50 mL IVPB  Status:  Discontinued     1 g 100 mL/hr over 30 Minutes Intravenous Every 24 hours 03/16/17 2043 03/18/17 1559   03/16/17 2015  cefTRIAXone (ROCEPHIN) 1 g in dextrose 5 % 50 mL IVPB     1 g 100 mL/hr over 30 Minutes Intravenous  Once 03/16/17 2000 03/16/17 2041       Subjective:  Getting washed up at sink:  Still ready to go home and frustrated that social worker had not talked to her.  Does not want to be placed in facility but wants to be safe.  Was up and ambulating in halls yesterday with her ex-husband.  Objective: Vitals:   04/04/17 0923 04/04/17 1732 04/04/17 2107 04/05/17 0915  BP: 107/63 125/75 108/63 (!) 95/55  Pulse: 93 87 78 82  Resp: 16 16 17 18   Temp: 97.7 F (36.5 C) 98.7 F (37.1 C) 97.8 F (36.6 C) 98.7 F (37.1 C)  TempSrc: Oral Oral Oral Oral  SpO2: 96% 100% 98% 98%  Weight:   55 kg (121 lb 3.2 oz)   Height:        Intake/Output Summary (Last 24 hours) at 04/05/17 1327 Last data filed at 04/05/17 1000  Gross per 24 hour  Intake              840 ml  Output              800 ml  Net               40  ml   Filed Weights   04/02/17 2025 04/03/17 2041 04/04/17 2107  Weight: 54.6 kg (120 lb 6.4 oz) 54.9 kg (121 lb) 55 kg (121 lb 3.2 oz)    Examination:  General exam:  Adult female.  No acute distress.  Getting washed up this morning at sink with assistance HEENT:  NCAT, MMM Respiratory system: Clear to auscultation bilaterally Cardiovascular system: Regular rate and rhythm, normal S1/S2. No murmurs, rubs, gallops or clicks.  Warm extremities Gastrointestinal system: Normal active bowel sounds, soft, nondistended, nontender. MSK:  Normal tone and bulk, no lower extremity edema Neuro:  grossly intact Psych:  Not crying today  Data Reviewed: I have personally reviewed following labs and imaging studies  CBC: No results for input(s): WBC, NEUTROABS, HGB, HCT, MCV, PLT in the last 168 hours. Basic Metabolic Panel:  Recent Labs Lab 03/30/17 1021  CREATININE 0.93   GFR: Estimated Creatinine Clearance: 59.3 mL/min (by C-G formula based on SCr of 0.93 mg/dL). Liver Function Tests: No results for input(s): AST, ALT, ALKPHOS, BILITOT, PROT, ALBUMIN in the last 168 hours. No results for input(s): LIPASE, AMYLASE in the last 168 hours. No results for input(s): AMMONIA in the last 168 hours. Coagulation Profile: No results for input(s): INR, PROTIME in the last 168 hours. Cardiac Enzymes: No results for input(s): CKTOTAL, CKMB, CKMBINDEX, TROPONINI in the last 168 hours. BNP (last 3 results) No results for input(s): PROBNP in the last 8760 hours. HbA1C: No results for input(s): HGBA1C in the last 72 hours. CBG:  Recent Labs Lab 04/01/17 0758 04/01/17 1136 04/02/17 0759 04/03/17 0755 04/04/17 0750  GLUCAP 85 104* 97 71 93   Lipid Profile: No results for input(s): CHOL, HDL, LDLCALC, TRIG, CHOLHDL, LDLDIRECT in the last 72 hours. Thyroid Function Tests: No results for input(s): TSH, T4TOTAL, FREET4, T3FREE, THYROIDAB in the last 72 hours. Anemia Panel: No results for  input(s): VITAMINB12, FOLATE, FERRITIN, TIBC, IRON, RETICCTPCT in the last 72 hours. Urine analysis:    Component Value Date/Time   COLORURINE YELLOW 03/16/2017 1915   APPEARANCEUR CLEAR 03/16/2017 1915   LABSPEC 1.009 03/16/2017 1915   PHURINE 6.0 03/16/2017 1915   GLUCOSEU NEGATIVE 03/16/2017 1915   HGBUR SMALL (A) 03/16/2017 1915   BILIRUBINUR NEGATIVE 03/16/2017 1915   BILIRUBINUR negative 03/28/2015 1321   KETONESUR 20 (A) 03/16/2017 1915   PROTEINUR NEGATIVE 03/16/2017 1915   UROBILINOGEN 0.2 03/28/2015 1321   UROBILINOGEN 0.2 04/18/2013 1222   NITRITE NEGATIVE 03/16/2017 1915   LEUKOCYTESUR LARGE (A) 03/16/2017 1915   Sepsis Labs: @LABRCNTIP (procalcitonin:4,lacticidven:4)  )No results found for this or any previous visit (from the past 240 hour(s)).    Radiology Studies: No results found.   Scheduled Meds: . anastrozole  1 mg Oral Daily  . enoxaparin (LOVENOX)  injection  40 mg Subcutaneous Q24H  . feeding supplement (ENSURE ENLIVE)  237 mL Oral TID BM  . gabapentin  300 mg Oral BID  . lamoTRIgine  200 mg Oral Daily  . loratadine  10 mg Oral Daily  . OXcarbazepine  300 mg Oral BID  . pantoprazole  40 mg Oral Daily  . potassium chloride  10 mEq Oral Daily  . QUEtiapine  50 mg Oral QHS  . sodium chloride flush  10-40 mL Intracatheter Q12H  . sodium chloride flush  10-40 mL Intracatheter Q12H  . sodium chloride flush  3 mL Intravenous Q12H  . traZODone  200 mg Oral QHS   Continuous Infusions: . sodium chloride    . sodium chloride       LOS: 19 days    Time spent: 30 min    Janece Canterbury, MD [facility] Pager [phone]  If 7PM-7AM, please contact night-coverage [email or web address] Password [email or web address] 04/05/2017, 1:27 PM

## 2017-04-05 NOTE — Clinical Social Work Note (Signed)
CSW talked with patient today, per her request, regarding her discharge plan. [name] was advised that SW administration working on placement due to her not having Medicaid at this time. Patient concerns regarding not having a say in her placement and not having insurance discussed. [name] was assured that she will be advised once placement secured.  Slayde Brault Givens, MSW, LCSW Licensed Clinical Social Worker [place] [phone]

## 2017-04-06 LAB — BASIC METABOLIC PANEL
Anion gap: 6 (ref 5–15)
BUN: 8 mg/dL (ref 6–20)
CO2: 28 mmol/L (ref 22–32)
Calcium: 8.8 mg/dL — ABNORMAL LOW (ref 8.9–10.3)
Chloride: 107 mmol/L (ref 101–111)
Creatinine, Ser: 0.74 mg/dL (ref 0.44–1.00)
GFR calc Af Amer: >60 mL/min (ref >60)
GFR calc non Af Amer: >60 mL/min (ref >60)
Glucose, Bld: 97 mg/dL (ref 65–99)
Potassium: 4 mmol/L (ref 3.5–5.1)
Sodium: 141 mmol/L (ref 135–145)

## 2017-04-06 LAB — CBC
HCT: 35 % — ABNORMAL LOW (ref 36.0–46.0)
Hemoglobin: 11.3 g/dL — ABNORMAL LOW (ref 12.0–15.0)
MCH: 30.6 pg (ref 26.0–34.0)
MCHC: 32.3 g/dL (ref 30.0–36.0)
MCV: 94.9 fL (ref 78.0–100.0)
Platelets: 166 10*3/uL (ref 150–400)
RBC: 3.69 MIL/uL — ABNORMAL LOW (ref 3.87–5.11)
RDW: 12.7 % (ref 11.5–15.5)
WBC: 2.8 10*3/uL — ABNORMAL LOW (ref 4.0–10.5)

## 2017-04-06 MED ORDER — ENOXAPARIN SODIUM 40 MG/0.4ML ~~LOC~~ SOLN
40.0000 mg | SUBCUTANEOUS | Status: DC
  Administered 2017-04-06 – 2017-04-07 (×2): 40 mg via SUBCUTANEOUS
  Filled 2017-04-06 (×2): qty 0.4

## 2017-04-06 MED ORDER — LAMOTRIGINE 200 MG PO TABS: 200.0000 mg | ORAL_TABLET | Freq: Every day | ORAL | 0 refills | Status: DC

## 2017-04-06 MED ORDER — IBUPROFEN 800 MG PO TABS: 800.0000 mg | ORAL_TABLET | Freq: Four times a day (QID) | ORAL | 0 refills | Status: DC | PRN

## 2017-04-06 MED ORDER — GABAPENTIN 300 MG PO CAPS
300.0000 mg | ORAL_CAPSULE | Freq: Two times a day (BID) | ORAL | 0 refills | Status: DC
Start: 2017-04-06 — End: 2017-05-21

## 2017-04-06 MED ORDER — OXCARBAZEPINE 300 MG PO TABS: 300.0000 mg | ORAL_TABLET | Freq: Two times a day (BID) | ORAL | 0 refills | Status: DC

## 2017-04-06 NOTE — Progress Notes (Signed)
Pt. Refusing to have lab drawn at this time.  States she does not want to wake up and will have the lab drawn when she is awake.  Will reschedule lab for later.

## 2017-04-06 NOTE — Clinical Social Work Note (Signed)
CSW advised by Clinical Social Work Surveyor, quantity, Nathaniel Man that patient will be able to discharge to [facility] on Wednesday, 5/9. Patient advised, discharge discussed and her questions addressed. CSW will facilitate discharge to [facility] on 5/9.  Madelina Sanda Givens, MSW, LCSW Licensed Clinical Social Worker [place] [phone]

## 2017-04-06 NOTE — Discharge Summary (Signed)
Physician Discharge Summary  Kassie Keng GNF:[id number] DOB: Dec 21, 1960 DOA: 03/16/2017  PCP: Arnoldo Morale, MD  Admit date: 03/16/2017 Discharge date: 04/06/2017  Admitted From: home  Disposition:  SNF  Recommendations for Outpatient Follow-up:  1. Follow up with Dr. [name] in 1 weeks 2. Please obtain BMP/CBC at next visit 3. Psychiatry to consult/follow if possible 4. Ongoing PT/OT at SNF  Discharge Condition:  Stable, improved CODE STATUS:  Full code  Diet recommendation:  regular   Brief/Interim Summary:  56 year old female with history of alcohol abuse, bipolar disorder, left breast cancer with brain metastasis status post mastectomy and craniectomy with radiation to brain and breast came to ED with confusion and hallucinations.  She was treated with ceftriaxone for acute cystitis but continues to have intermittent confusion.  Patient has been seen by psychiatry and deemed not having decision-making capacity for her medical and psychiatric condition.  Multiple adjustments were made to her psychiatric medications, including cessation of her lithium and dose adjustments to several of her other medications.  She continues to have impulsivity, some disorganized thinking, and emotional lability but her dizziness/lightheadedness and tremor have improved.  She does not have insurance and it took many days for her PASSARR to be completed and for the hospital to make arrangements for SNF for 24 hour supervision with assistance of family, primarily her son.    Discharge Diagnoses:  Principal Problem:   Acute encephalopathy Active Problems:   Brain metastasis ([place])   Metastatic breast cancer ([facility])   Bipolar I disorder, most recent episode depressed ([place])   Acute lower UTI  Acute on chronic encephalopathy related to suboccipital craniectomy for resection of posterior fossa metastases in September 2017.  MRI of the brain prior to admission was stable and her head CT during this admission  demonstrated no acute changes. She was treated for urinary tract infection but continued to have intermittent episodes of confusion requiring a [employer] cabin [employer]. She was seen by psychiatry who determined that she does not have decision-making capacity regarding medical problems or disposition.  She required safety sitter and posey belt during a portion of her hospitalization.    Sore throat, improving with GI cocktail  Acute cystitis, present at time of admission, completed treatment with ceftriaxone.  Bipolar disorder, seen by psychiatry.  Emotional lability.  Psych deemed her to not having decision-making capacity. Her lithium was discontinued due to tremor and subtherapeutic levels. She was started on trileptal.  Her trileptal and gabapentin were decreased due to confusion, sedation, and poor balance.  Lamictal was increased from 150mg  to 200mg .    Tremors, improved with cessation of lithium  History of metastatic breast cancer to brain.  Status post mastectomy and axillary node dissection, suboccipital craniectomy for resection of posterior fossa metastasis, radiation to brain and breast. Continue Arimidex and Herceptin. Patient was seen by Dr. [name] in the hospital and will follow up in 1 week.    Leukopenia and mild anemia due to metastatic cancer.  Patient to follow up with oncology in 1 week.    Discharge Instructions  Discharge Instructions    Diet general    Complete by:  As directed    Increase activity slowly    Complete by:  As directed        Medication List    STOP taking these medications   lithium carbonate 300 MG capsule     TAKE these medications   anastrozole 1 MG tablet Commonly known as:  ARIMIDEX Take 1 tablet (1 mg total)  by mouth daily.   Biotin 1 MG Caps Take 1 mg by mouth daily.   cetirizine 10 MG tablet Commonly known as:  ZYRTEC Take 1 tablet (10 mg total) by mouth daily.   diphenhydrAMINE 25 MG tablet Commonly known as:  BENADRYL Take 25  mg by mouth every 6 (six) hours as needed for allergies.   gabapentin 300 MG capsule Commonly known as:  NEURONTIN Take 1 capsule (300 mg total) by mouth 2 (two) times daily. What changed:  how much to take  when to take this   ibuprofen 800 MG tablet Commonly known as:  ADVIL,MOTRIN Take 1 tablet (800 mg total) by mouth every 6 (six) hours as needed for headache, mild pain or moderate pain. What changed:  medication strength   lamoTRIgine 200 MG tablet Commonly known as:  LAMICTAL Take 1 tablet (200 mg total) by mouth daily. Start taking on:  04/07/2017 What changed:  medication strength  how much to take   lidocaine-prilocaine cream Commonly known as:  EMLA Apply 1 application topically as needed (prior to accessing port).   magic mouthwash Soln Take 5 mLs by mouth 4 (four) times daily as needed for mouth pain.   Oxcarbazepine 300 MG tablet Commonly known as:  TRILEPTAL Take 1 tablet (300 mg total) by mouth 2 (two) times daily.   pantoprazole 40 MG tablet Commonly known as:  PROTONIX Take 40 mg by mouth daily.   potassium chloride 10 MEQ CR capsule Commonly known as:  MICRO-K Take 1 capsule (10 mEq total) by mouth daily.   QUEtiapine 50 MG tablet Commonly known as:  SEROQUEL Take 1 tablet (50 mg total) by mouth 2 (two) times daily.   traZODone 100 MG tablet Commonly known as:  DESYREL Take 200 mg by mouth at bedtime.      Follow-up Information    Arnoldo Morale, MD Follow up.   Specialty:  Family Medicine Why:  as needed Contact information: [place] [address] [phone]        Magrinat, Virgie Dad, MD. Schedule an appointment as soon as possible for a visit in 1 week(s).   Specialty:  Oncology Contact information: [address] [place] [address] [phone]          Allergies  Allergen Reactions  . Demerol Itching and Nausea And Vomiting  . Erythromycin Rash     Consultations: Psychiatry Cardiology (for cardiac clearance for MRI with sedation)   Procedures/Studies: Ct Head Wo Contrast  Result Date: 03/16/2017 CLINICAL DATA:  Altered mental status EXAM: CT HEAD WITHOUT CONTRAST TECHNIQUE: Contiguous axial images were obtained from the base of the skull through the vertex without intravenous contrast. COMPARISON:  03/02/2017, 02/25/2017, 12/03/2016 FINDINGS: Brain: No acute territorial infarction, hemorrhage, or new mass lesion is seen. Stable left occipital craniotomy changes. Small focus of encephalomalacia in the left cerebellum as before. Stable ventricle size.  No midline shift. Vascular: No hyperdense vessels.  No unexpected calcification. Skull: Left occipital and suboccipital postsurgical changes. No acute or suspicious bone lesion Sinuses/Orbits: Mild mucosal thickening in the sphenoid and ethmoid sinuses. No acute orbital abnormality. Other: Mild soft tissue thickening or swelling over the left supraorbital region IMPRESSION: No definite CT evidence for acute intracranial abnormality ; stable postsurgical changes of the posterior fossa and left occipital bone. Electronically Signed   By: Donavan Foil M.D.   On: 03/16/2017 19:09   Ct Chest W Contrast  Result Date: 03/18/2017 CLINICAL DATA:  Breast cancer.  [facility] [place]  breath.  Brain metastasis. EXAM: CT CHEST WITH CONTRAST TECHNIQUE: Multidetector CT imaging of the chest was performed during intravenous contrast administration. CONTRAST:  88mL ISOVUE-300 IOPAMIDOL (ISOVUE-300) INJECTION 61% COMPARISON:  CT 11/10/2016 FINDINGS: Cardiovascular: Port in RIGHT chest wall with tip the distal SVC. No significant vascular findings. Normal heart size. No pericardial effusion. Mediastinum/Nodes: No axillary supraclavicular adenopathy. No mediastinal hilar adenopathy Lungs/Pleura: Mild atelectasis lung bases. Mild linear scarring LEFT lung apex. No suspicious nodularity. Pulmonary edema or pulmonary infection.  Upper Abdomen: Limited view of the liver, kidneys, pancreas are unremarkable. Normal adrenal glands. Multiple small hepatic hypodensities likely represent benign cysts for not comparison Musculoskeletal: Scatter sclerotic lesions in the spine for stable. Post LEFT mastectomy with breast prosthetic. IMPRESSION: 1. No evidence of breast cancer progression. 2. No acute pulmonary parenchymal findings. 3. Stable sclerotic lesions in the thoracic spine. Electronically Signed   By: Suzy Bouchard M.D.   On: 03/18/2017 12:14   Dg Chest Port 1 View  Result Date: 03/16/2017 CLINICAL DATA:  Altered mental status. Metastatic left breast cancer. EXAM: PORTABLE CHEST 1 VIEW COMPARISON:  02/25/2017 chest radiograph. FINDINGS: Right subclavian MediPort terminates at the cavoatrial junction. Stable cardiomediastinal silhouette with normal heart size. No pneumothorax. No pleural effusion. Lungs appear clear, with no acute consolidative airspace disease and no pulmonary edema. Asymmetric haziness overlying the left lower lung is due to overlying left breast prosthesis. IMPRESSION: No active cardiopulmonary disease. Electronically Signed   By: Ilona Sorrel M.D.   On: 03/16/2017 16:31    Subjective: Angry and tearful and wants to go home before she goes to SNF.  "Only I can unlock my door" and "I don't have any clothes and I want to take a shower."  Denies chest pains, vomiting, diarrhea, dysuria.    Discharge Exam: Vitals:   04/06/17 0634 04/06/17 0900  BP: (!) 98/53 105/67  Pulse: 77 93  Resp:  18  Temp:  97.7 F (36.5 C)   Vitals:   04/05/17 2129 04/06/17 0528 04/06/17 0634 04/06/17 0900  BP: (!) 100/56 (!) 80/39 (!) 98/53 105/67  Pulse: 85 70 77 93  Resp: 15 16  18   Temp: 98.4 F (36.9 C) 98.3 F (36.8 C)  97.7 F (36.5 C)  TempSrc:    Oral  SpO2: 96% 97%  99%  Weight: 51.3 kg (113 lb)     Height:        General exam:Adult female. No acute distress.  HEENT:NCAT, MMM Respiratory system:  Clear to auscultation bilaterally Cardiovascular system:Regular rate and rhythm, normal S1/S2. No murmurs, rubs, gallops or clicks. Warm extremities Gastrointestinal system:Normal active bowel sounds, soft, nondistended, nontender. WJX:BJYNWG tone and bulk, no lower extremity edema Neuro: grossly moves all extremities Psych:  angry and tearful    The results of significant diagnostics from this hospitalization (including imaging, microbiology, ancillary and laboratory) are listed below for reference.     Microbiology: No results found for this or any previous visit (from the past 240 hour(s)).   Labs: BNP (last 3 results) No results for input(s): BNP in the last 8760 hours. Basic Metabolic Panel:  Recent Labs Lab 04/06/17 0822  NA 141  K 4.0  CL 107  CO2 28  GLUCOSE 97  BUN 8  CREATININE 0.74  CALCIUM 8.8*   Liver Function Tests: No results for input(s): AST, ALT, ALKPHOS, BILITOT, PROT, ALBUMIN in the last 168 hours. No results for input(s): LIPASE, AMYLASE in the last 168 hours. No results for input(s): AMMONIA in the  last 168 hours. CBC:  Recent Labs Lab 04/06/17 0822  WBC 2.8*  HGB 11.3*  HCT 35.0*  MCV 94.9  PLT 166   Cardiac Enzymes: No results for input(s): CKTOTAL, CKMB, CKMBINDEX, TROPONINI in the last 168 hours. BNP: Invalid input(s): POCBNP CBG:  Recent Labs Lab 04/01/17 0758 04/01/17 1136 04/02/17 0759 04/03/17 0755 04/04/17 0750  GLUCAP 85 104* 97 71 93   D-Dimer No results for input(s): DDIMER in the last 72 hours. Hgb A1c No results for input(s): HGBA1C in the last 72 hours. Lipid Profile No results for input(s): CHOL, HDL, LDLCALC, TRIG, CHOLHDL, LDLDIRECT in the last 72 hours. Thyroid function studies No results for input(s): TSH, T4TOTAL, T3FREE, THYROIDAB in the last 72 hours.  Invalid input(s): FREET3 Anemia work up No results for input(s): VITAMINB12, FOLATE, FERRITIN, TIBC, IRON, RETICCTPCT in the last 72  hours. Urinalysis    Component Value Date/Time   COLORURINE YELLOW 03/16/2017 1915   APPEARANCEUR CLEAR 03/16/2017 1915   LABSPEC 1.009 03/16/2017 1915   PHURINE 6.0 03/16/2017 1915   GLUCOSEU NEGATIVE 03/16/2017 1915   HGBUR SMALL (A) 03/16/2017 1915   BILIRUBINUR NEGATIVE 03/16/2017 1915   BILIRUBINUR negative 03/28/2015 1321   KETONESUR 20 (A) 03/16/2017 1915   PROTEINUR NEGATIVE 03/16/2017 1915   UROBILINOGEN 0.2 03/28/2015 1321   UROBILINOGEN 0.2 04/18/2013 1222   NITRITE NEGATIVE 03/16/2017 1915   LEUKOCYTESUR LARGE (A) 03/16/2017 1915   Sepsis Labs Invalid input(s): PROCALCITONIN,  WBC,  LACTICIDVEN   Time coordinating discharge: Over 30 minutes  SIGNED:   Janece Canterbury, MD  [facility] 04/06/2017, 2:20 PM Pager   If 7PM-7AM, please contact night-coverage [email or web address] Password [email or web address]

## 2017-04-07 ENCOUNTER — Inpatient Hospital Stay
Admission: RE | Admit: 2017-04-07 | Discharge: 2017-04-16 | Disposition: A | Discharge status: Home / Self Care | Payer: Medicaid Other | Source: Ambulatory Visit | Attending: Internal Medicine | Admitting: Internal Medicine

## 2017-04-07 LAB — GLUCOSE, CAPILLARY
Glucose-Capillary: 96 mg/dL (ref 65–99)
Glucose-Capillary: 88 mg/dL (ref 65–99)

## 2017-04-07 MED ORDER — HEPARIN SOD (PORK) LOCK FLUSH 100 UNIT/ML IV SOLN
500.0000 [IU] | INTRAVENOUS | Status: AC | PRN
  Administered 2017-04-07: 500 [IU]

## 2017-04-07 NOTE — Progress Notes (Signed)
Report given to admitting nurse at [facility]). Patient has been bathed, she ate and ready. Waiting for PTAR. Will continue to monitor.

## 2017-04-07 NOTE — Progress Notes (Signed)
Patient seen and examined at bedside. Medically stable for discharge today. Please refer to discharge summary completed 04/06/2017.  Leisa Lenz [facility] [phone]

## 2017-04-07 NOTE — Clinical Social Work Placement (Signed)
   CLINICAL SOCIAL WORK PLACEMENT  NOTE 04/07/17 - DISCHARGED TO [facility] VIA AMBULANCE  Date:  04/07/2017  Patient Details  Name: [name] MRN: [id number] Date of Birth: 02/22/1961  Clinical Social Work is seeking post-discharge placement for this patient at the [place] level of care (*CSW will initial, date and re-position this form in  chart as items are completed):  No (DTP Patient)   Patient/family provided with [place] Work Department's list of facilities offering this level of care within the geographic area requested by the patient (or if unable, by the patient's family).  Yes (Patient aware of DTP status and SNF search being conducted by [place] Surveyor, quantity)   Patient/family informed of their freedom to choose among providers that offer the needed level of care, that participate in Medicare, Medicaid or managed care program needed by the patient, have an available bed and are willing to accept the patient.  Yes   Patient/family informed of [facility]'s ownership interest in [facility] and [facility], as well as of the fact that they are under no obligation to receive care at these facilities.  PASRR submitted to EDS on 03/23/17     PASRR number received on 03/29/17     Existing PASRR number confirmed on       FL2 transmitted to all facilities in geographic area requested by pt/family on 03/23/17     FL2 transmitted to all facilities within larger geographic area on       Patient informed that his/her managed care company has contracts with or will negotiate with certain facilities, including the following:        Yes   Patient/family informed of bed offers received.  Patient chooses bed at [facility]     Physician recommends and patient chooses bed at      Patient to be transferred to [facility] on 04/07/17.  Patient to be transferred to facility by ambulance     Patient family notified  on 04/07/17 of transfer.  Name of family member notified:  Maryjo Ragon, son - [phone]. CSW contacted Mr. Koebel and he was aware of discharge to [facility], as he was contacted by facility staff. He has also been in contact with [facility] regarding his mom's disability/Medicaid applications.    PHYSICIAN       Additional Comment:    _______________________________________________ [name], LCSW 04/07/2017, 2:12 PM

## 2017-04-07 NOTE — Progress Notes (Signed)
Occupational Therapy Treatment Patient Details Name: [name] MRN: [id number] DOB: 12-12-1960 Today's Date: 04/07/2017    History of present illness Pt is a 56 y/o female presented to the ED with confusion and hallucinations. Of note, pt recently d/c'd from [facility] on 03/01/17 for similar issues. 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      Follow Up Recommendations  SNF;Supervision/Assistance - 24 hour          Precautions / Restrictions Precautions Precautions: Fall Precaution Comments: impulsive, decreased safety awareness, left lateral lean, LOB, list.  Posey belt and chair alarm.  Restrictions Weight Bearing Restrictions: No Other Position/Activity Restrictions: per IV team pt. only allowed to sponge bathe, no showers at this time       Mobility Bed Mobility Overal bed mobility: Modified Independent             General bed mobility comments: Can do this independently, but needs S due to decreased safety awareness  Transfers Overall transfer level: Needs assistance   Transfers: Sit to/from Stand Sit to Stand: Supervision Stand pivot transfers: Supervision                ADL either performed or assessed with clinical judgement   ADL Overall ADL's : Needs assistance/impaired                         Toilet Transfer: Supervision/safety;Ambulation Toilet Transfer Details (indicate cue type and reason): Impulsively moving throughout session. Toileting- Clothing Manipulation and Hygiene: Supervision/safety;Sit to/from stand;Cueing for safety;Cueing for sequencing         General ADL Comments: continues with poor safety awareness and impulsitivity.  max cues for initiation and completetion of tasks.  easily distracted.  VC to slow down!  Pt possibly leaving this day. Pt made a list of things she would need at SNF.  Pt states sister will obtain these items               Cognition  Arousal/Alertness: Awake/alert Behavior During Therapy: Impulsive Overall Cognitive Status: Impaired/Different from baseline Area of Impairment: Memory;Orientation;Following commands;Safety/judgement;Awareness;Problem solving                 Orientation Level: Disoriented to;Time Current Attention Level: Selective Memory: Decreased recall of precautions;Decreased short-term memory Following Commands: Follows one step commands consistently Safety/Judgement: Decreased awareness of safety;Decreased awareness of deficits Awareness: Intellectual Problem Solving: Slow processing;Decreased initiation;Difficulty sequencing;Requires verbal cues;Requires tactile cues            Frequency  Min 2X/week        Progress Toward Goals  OT Goals(current goals can now be found in the care plan section)        Plan Discharge plan remains appropriate    Co-evaluation                 AM-PAC PT "6 Clicks" Daily Activity     Outcome Measure   Help from another person eating meals?: None Help from another person taking care of personal grooming?: A Little Help from another person toileting, which includes using toliet, bedpan, or urinal?: A Little Help from another person bathing (including washing, rinsing, drying)?: A Little Help from another person to put on and taking off regular upper body clothing?: A Little Help from another person to put on and taking off regular lower body clothing?: A Little 6 Click Score: 19    End of Session  OT Visit Diagnosis: Muscle weakness (generalized) (M62.81);Other abnormalities of gait and mobility (R26.89);Cognitive communication deficit (R41.841)   Activity Tolerance Patient tolerated treatment well   Patient Left in chair;with chair alarm set   Nurse Communication Mobility status        Time: 1036-1050 OT Time Calculation (min): 14 min  Charges: OT General Charges $OT Visit: 1 Procedure OT Treatments $Self Care/Home  Management : 8-22 mins  [place], [place]   Payton Mccallum D 04/07/2017, 11:02 AM

## 2017-04-08 ENCOUNTER — Non-Acute Institutional Stay (SKILLED_NURSING_FACILITY): Payer: Medicaid Other | Admitting: Internal Medicine

## 2017-04-08 ENCOUNTER — Encounter: Payer: Self-pay | Admitting: Internal Medicine

## 2017-04-08 DIAGNOSIS — F3130 Bipolar disorder, current episode depressed, mild or moderate severity, unspecified: Secondary | ICD-10-CM

## 2017-04-08 DIAGNOSIS — N390 Urinary tract infection, site not specified: Secondary | ICD-10-CM | POA: Diagnosis not present

## 2017-04-08 DIAGNOSIS — G9340 Encephalopathy, unspecified: Secondary | ICD-10-CM

## 2017-04-08 DIAGNOSIS — F17200 Nicotine dependence, unspecified, uncomplicated: Secondary | ICD-10-CM

## 2017-04-08 DIAGNOSIS — C50919 Malignant neoplasm of unspecified site of unspecified female breast: Secondary | ICD-10-CM

## 2017-04-08 NOTE — Progress Notes (Signed)
Provider:  Veleta Miners Location:   [place] Room Number: 140/D Place of Service:  SNF (31)  PCP: Arnoldo Morale, MD Patient Care Team: Arnoldo Morale, MD as PCP - General (Family Medicine) Kyung Rudd, MD as Consulting Physician (Radiation Oncology) Erline Levine, MD as Consulting Physician (Neurosurgery)  Extended Emergency Contact Information Primary Emergency Contact: Hamiliton,Paige Address: [address]          [place], [place] [address] Johnnette Litter of [place] Phone: [phone] Relation: Sister Secondary Emergency Contact: Kermit Balo [place] of [place] Phone: [phone] Relation: Other  Code Status: Full Code Goals of Care: Advanced Directive information Advanced Directives 04/08/2017  Does Patient Have a Medical Advance Directive? Yes  Type of Advance Directive (No Data)  Does patient want to make changes to medical advance directive? No - Patient declined  Copy of [place] in Chart? -  Would patient like information on creating a medical advance directive? No - Patient declined      Chief Complaint  Patient presents with  . New Admit To SNF    HPI: Patient is a 56 y.o. female seen today for admission to SNF for therapy and possible placement. Patient has h/o Alcohol abuse, Bipolar disorder, Breast cancer with Brain Metastasis in remission, Status post mastectomy and axillary node dissection, suboccipital craniectomy for resection of posterior fossa metastasis, radiation to brain and breast. Continues to be on Herceptin and Arimidex.  Patient was admitted to the hospital for increased confusion , hallucinations and weakness. Her CT scan of head was negative. She did have UTI which was treated with Antibiotics.It was thought that her problems were related to her Psych meds. And she Pschy evaluated her and made some changes to her meds including stopping Lithium. She was not thought to be safe to be  discharged home. She was send here for Long term placement.  When I went to see patient she is very anxious. Continues to be unsteady on her feet. She started crying many times during my interview. She says she wants to go home but doesn't know where. She is upset with everybody and just wants to leave this place. She wants to start to smoke again.  Past Medical History:  Diagnosis Date  . Alcohol abuse   . Anemia    during chemo  . Anxiety    At age 23  . Arthritis Dx 2010  . Bipolar disorder ([place])   . Cancer ([place])    breast mets to brain  . Chronic pain   . Complication of anesthesia   . Depression   . Fibromyalgia Dx 2005  . GERD (gastroesophageal reflux disease)   . Headache    hx  migraines  . Opiate dependence ([place])   . PONV (postoperative nausea and vomiting)   . Port-a-cath in place   . PTSD (post-traumatic stress disorder)    Past Surgical History:  Procedure Laterality Date  . APPLICATION OF CRANIAL NAVIGATION N/A 08/14/2016   Procedure: APPLICATION OF CRANIAL NAVIGATION;  Surgeon: Erline Levine, MD;  Location: [facility];  Service: Neurosurgery;  Laterality: N/A;  . BREAST RECONSTRUCTION Left    with silicone implant  . CRANIOTOMY N/A 08/14/2016   Procedure: CRANIOTOMY TUMOR EXCISION WITH Lucky Rathke;  Surgeon: Erline Levine, MD;  Location: [facility];  Service: Neurosurgery;  Laterality: N/A;  . FIBULA FRACTURE SURGERY Left   . MASTECTOMY Left   . RADIOLOGY WITH ANESTHESIA N/A 07/23/2016   Procedure: MRI OF BRAIN  WITH AND WITHOUT;  Surgeon: Medication Radiologist, MD;  Location: [facility];  Service: Radiology;  Laterality: N/A;  . RADIOLOGY WITH ANESTHESIA N/A 09/08/2016   Procedure: MRI OF BRAIN WITH AND WITHOUT CONTRAST;  Surgeon: Medication Radiologist, MD;  Location: [facility];  Service: Radiology;  Laterality: N/A;  . RADIOLOGY WITH ANESTHESIA N/A 12/10/2016   Procedure: MRI OF BRAIN WITH AND WITHOUT;  Surgeon: Medication Radiologist, MD;  Location: [facility];  Service:  Radiology;  Laterality: N/A;  . RADIOLOGY WITH ANESTHESIA N/A 03/02/2017   Procedure: MRI of BRAIN W and W/OUT CONTRAST;  Surgeon: Medication Radiologist, MD;  Location: [facility];  Service: Radiology;  Laterality: N/A;  . right power port placement Right     reports that she has been smoking Cigarettes.  She has been smoking about 0.25 packs per day. She has never used smokeless tobacco. She reports that she does not drink alcohol or use drugs. Social History   Social History  . Marital status: Single    Spouse name: N/A  . Number of children: N/A  . Years of education: N/A   Occupational History  . Not on file.   Social History Main Topics  . Smoking status: Current Every Day Smoker    Packs/day: 0.25    Types: Cigarettes  . Smokeless tobacco: Never Used     Comment: Pt is on Chantix at present time  . Alcohol use No     Comment: no ETOH since 08/22/12  . Drug use: No     Comment: prescription opiates from the street daily until Sunday  . Sexual activity: No     Comment: ablation   Other Topics Concern  . Not on file   Social History Narrative  . No narrative on file    Functional Status Survey:    Family History  Problem Relation Age of Onset  . Diabetes Mother   . Bipolar disorder Mother   . CAD Father     Health Maintenance  Topic Date Due  . Hepatitis C Screening  08[birth year]61  . TETANUS/TDAP  04/09/1980  . COLONOSCOPY  04/10/2011  . MAMMOGRAM  06/18/2017  . INFLUENZA VACCINE  06/30/2017  . PAP SMEAR  03/27/2018  . HIV Screening  Completed    Allergies  Allergen Reactions  . Demerol Itching and Nausea And Vomiting  . Erythromycin Rash    Outpatient Encounter Prescriptions as of 04/08/2017  Medication Sig  . anastrozole (ARIMIDEX) 1 MG tablet Take 1 tablet (1 mg total) by mouth daily.  . Biotin 1 MG CAPS Take 1 mg by mouth daily.  . cetirizine (ZYRTEC) 10 MG tablet Take 1 tablet (10 mg total) by mouth daily.  . diphenhydrAMINE (BENADRYL) 25 MG tablet  Take 25 mg by mouth every 6 (six) hours as needed for allergies.  [name] gabapentin (NEURONTIN) 300 MG capsule Take 1 capsule (300 mg total) by mouth 2 (two) times daily.  [name] ibuprofen (ADVIL,MOTRIN) 800 MG tablet Take 1 tablet (800 mg total) by mouth every 6 (six) hours as needed for headache, mild pain or moderate pain.  [name] lamoTRIgine (LAMICTAL) 200 MG tablet Take 1 tablet (200 mg total) by mouth daily.  . magic mouthwash SOLN Take 5 mLs by mouth 4 (four) times daily as needed for mouth pain.  . Oxcarbazepine (TRILEPTAL) 300 MG tablet Take 1 tablet (300 mg total) by mouth 2 (two) times daily.  . pantoprazole (PROTONIX) 40 MG tablet Take 40 mg by mouth daily.  . potassium chloride (MICRO-K)  10 MEQ CR capsule Take 1 capsule (10 mEq total) by mouth daily.  . QUEtiapine (SEROQUEL) 50 MG tablet Take 1 tablet (50 mg total) by mouth 2 (two) times daily.  . traZODone (DESYREL) 100 MG tablet Take 200 mg by mouth at bedtime.   . [DISCONTINUED] lidocaine-prilocaine (EMLA) cream Apply 1 application topically as needed (prior to accessing port).   No facility-administered encounter medications on file as of 04/08/2017.      Review of Systems  Review of Systems  Constitutional: Negative for activity change, appetite change, chills, diaphoresis, fatigue and fever.  HENT: Negative for mouth sores, postnasal drip, rhinorrhea, sinus pain and sore throat.   Respiratory: Negative for apnea, cough, chest tightness, shortness of breath and wheezing.   Cardiovascular: Negative for chest pain, palpitations and leg swelling.  Gastrointestinal: Negative for abdominal distention, abdominal pain,  diarrhea, nausea and vomiting.  Genitourinary: Negative for dysuria and frequency.  Musculoskeletal: Negative for arthralgias, joint swelling and myalgias.  Skin: Negative for rash.  Neurological: Negative for dizziness, syncope, weakness, light-headedness and numbness.  Psychiatric/Behavioral: Negative for behavioral problems,  confusion and sleep disturbance.     Vitals:   04/08/17 1004  BP: 99/60  Pulse: 72  Resp: 16  Temp: 98.4 F (36.9 C)  TempSrc: Oral   There is no height or weight on file to calculate BMI. Physical Exam  Constitutional: She is oriented to person, place, and time. She appears well-developed and well-nourished.  HENT:  Head: Normocephalic.  Mouth/Throat: Oropharynx is clear and moist.  Eyes: Pupils are equal, round, and reactive to light.  Neck: Neck supple.  Cardiovascular: Normal rate, regular rhythm and normal heart sounds.   No murmur heard. Pulmonary/Chest: Effort normal and breath sounds normal. No respiratory distress. She has no wheezes. She has no rales.  Abdominal: Soft. Bowel sounds are normal. She exhibits no distension. There is no tenderness. There is no rebound.  Musculoskeletal: She exhibits no edema.  Neurological: She is alert and oriented to person, place, and time.  No Focal deficit  Skin: Skin is warm and dry.  Psychiatric: Her mood appears anxious. Her speech is rapid and/or pressured. She is agitated. Cognition and memory are normal. She expresses impulsivity. She exhibits a depressed mood. She is inattentive.    Labs reviewed: Basic Metabolic Panel:  Recent Labs  12/04/16 0622  03/26/17 0436 03/27/17 0441 03/30/17 1021 04/06/17 0822  NA 141  < > 141 140  --  141  K 3.9  < > 3.4* 3.7  --  4.0  CL 111  < > 108 108  --  107  CO2 22  < > 29 28  --  28  GLUCOSE 109*  < > 101* 101*  --  97  BUN 13  < > 6 8  --  8  CREATININE 0.67  < > 0.68 0.69 0.93 0.74  CALCIUM 8.3*  < > 9.1 9.0  --  8.8*  MG 2.2  --   --   --   --   --   < > = values in this interval not displayed. Liver Function Tests:  Recent Labs  03/16/17 1630 03/24/17 1132 03/26/17 0436  AST 15 16 14*  ALT 11* 12* 12*  ALKPHOS 34* 37* 33*  BILITOT 0.5 0.4 0.2*  PROT 5.5* 5.5* 5.2*  ALBUMIN 3.8 3.5 3.4*   No results for input(s): LIPASE, AMYLASE in the last 8760 hours.  Recent  Labs  12/03/16 1609 12/03/16 1843 03/16/17 2100  AMMONIA 20 17 16    CBC:  Recent Labs  03/16/17 1630  03/24/17 1132 03/26/17 0436 03/27/17 0441 04/06/17 0822  WBC 4.5  --  3.4* 3.8* 2.9* 2.8*  NEUTROABS 3.1  --  2.2 2.2  --   --   HGB 11.5*  --  11.8* 11.4* 11.1* 11.3*  HCT 36.3  < > 36.7 35.1* 34.4* 35.0*  MCV 96.8  --  95.8 94.6 94.8 94.9  PLT 180  --  190 179 183 166  < > = values in this interval not displayed. Cardiac Enzymes: No results for input(s): CKTOTAL, CKMB, CKMBINDEX, TROPONINI in the last 8760 hours. BNP: Invalid input(s): POCBNP Lab Results  Component Value Date   HGBA1C 5.60 02/28/2015   Lab Results  Component Value Date   TSH 3.693 12/03/2016   Lab Results  Component Value Date   [id number] 408 12/03/2016   No results found for: FOLATE Lab Results  Component Value Date   FERRITIN 10 (L) 06/03/2016    Imaging and Procedures obtained prior to SNF admission: No results found.  Assessment/Plan  Acute encephalopathy Her Encephalopathy was thought to be due to her Pscyh Meds. According to them she is incompetent to make decisions. She started crying when I told her and says nobody knows her and she just wants to go home. Will contiue to monitor her here. Patient says she is talking to her son to see if she can live with him but he is not responding.  At this time she will stay in the facility till we have further plans of discharge.  Acute lower UTI Was treated with Antibiotics. Asymptomatic now Bipolar I disorder with anxiety On number of Meds Will get psych to follow her in facility Also start her on Ativan 0.25 mg BID prn for now.She was on this at home.  Current smoker Cannot let her go for smoking out side as she is at risk of falling  Will try Nicotine patch  Metastatic breast cancer  Follow up with Dr [name] On Arimidex   Constipation Started on Miralax.  Family/ staff Communication:   Labs/tests ordered: Repeat BMP and  CBC  Total time spent in this patient care encounter was _45 minutes; greater than 50% of the visit spent counseling patient and coordinating care for problems addressed at this encounter.

## 2017-04-09 ENCOUNTER — Non-Acute Institutional Stay (SKILLED_NURSING_FACILITY): Payer: Medicaid Other | Admitting: Internal Medicine

## 2017-04-09 ENCOUNTER — Other Ambulatory Visit: Payer: Self-pay | Admitting: Oncology

## 2017-04-09 DIAGNOSIS — R52 Pain, unspecified: Secondary | ICD-10-CM | POA: Diagnosis not present

## 2017-04-09 DIAGNOSIS — E876 Hypokalemia: Secondary | ICD-10-CM | POA: Diagnosis not present

## 2017-04-09 DIAGNOSIS — F3130 Bipolar disorder, current episode depressed, mild or moderate severity, unspecified: Secondary | ICD-10-CM | POA: Diagnosis not present

## 2017-04-11 NOTE — Progress Notes (Signed)
This is an acute visit.  Level care skilled.  Facility is [facility].  Chief  complaint-acute visit secondary to pain management.  History of present illness.   Patient is a 56 year old female with a history of alcohol abuse bipolar disorder breast cancer with brain metastasis in remission.  Status post mastectomy and axillary node dissection-some occipital craniotomy for resection of posterior fall some taxes this-status post radiation to brain and breast.   She was admitted to the hospital for confusion and hallucinations and weakness-she was on Avalide type treated with antibiotics.  Was thought certain problems were related to her psychiatric meds in these were reevaluated including stopping her lithium-she was thought not to be safe for discharge home and is here for placement.  She does have a history of pain-she is on Neurontin 300 mg twice a day as well as ibuprofen 800 mg every 6 hours when necessary.  She continues to complain however of generalized pain more headache pain which she says is not new.  She says she has received tramadol the past with good effect and would like this restarted.  She does not complain of any acute visual changes or headache pain that is atypical from previous discomfort.  Her vital signs appear to be stable  Past Medical History:  Diagnosis Date  . Alcohol abuse   . Anemia    during chemo  . Anxiety    At age 64  . Arthritis Dx 2010  . Bipolar disorder ([place])   . Cancer ([place])    breast mets to brain  . Chronic pain   . Complication of anesthesia   . Depression   . Fibromyalgia Dx 2005  . GERD (gastroesophageal reflux disease)   . Headache    hx  migraines  . Opiate dependence ([place])   . PONV (postoperative nausea and vomiting)   . Port-a-cath in place   . PTSD (post-traumatic stress disorder)         Past Surgical History:  Procedure Laterality Date  . APPLICATION OF CRANIAL NAVIGATION N/A 08/14/2016     Procedure: APPLICATION OF CRANIAL NAVIGATION;  Surgeon: Erline Levine, MD;  Location: [facility];  Service: Neurosurgery;  Laterality: N/A;  . BREAST RECONSTRUCTION Left    with silicone implant  . CRANIOTOMY N/A 08/14/2016   Procedure: CRANIOTOMY TUMOR EXCISION WITH Lucky Rathke;  Surgeon: Erline Levine, MD;  Location: [facility];  Service: Neurosurgery;  Laterality: N/A;  . FIBULA FRACTURE SURGERY Left   . MASTECTOMY Left   . RADIOLOGY WITH ANESTHESIA N/A 07/23/2016   Procedure: MRI OF BRAIN WITH AND WITHOUT;  Surgeon: Medication Radiologist, MD;  Location: [facility];  Service: Radiology;  Laterality: N/A;  . RADIOLOGY WITH ANESTHESIA N/A 09/08/2016   Procedure: MRI OF BRAIN WITH AND WITHOUT CONTRAST;  Surgeon: Medication Radiologist, MD;  Location: [facility];  Service: Radiology;  Laterality: N/A;  . RADIOLOGY WITH ANESTHESIA N/A 12/10/2016   Procedure: MRI OF BRAIN WITH AND WITHOUT;  Surgeon: Medication Radiologist, MD;  Location: [facility];  Service: Radiology;  Laterality: N/A;  . RADIOLOGY WITH ANESTHESIA N/A 03/02/2017   Procedure: MRI of BRAIN W and W/OUT CONTRAST;  Surgeon: Medication Radiologist, MD;  Location: [facility];  Service: Radiology;  Laterality: N/A;  . right power port placement Right     reports that she has been smoking Cigarettes.  She has been smoking about 0.25 packs per day. She has never used smokeless tobacco. She reports that she does not drink alcohol or  use drugs. Social History        Social History  . Marital status: Single    Spouse name: N/A  . Number of children: N/A  . Years of education: N/A      Occupational History  . Not on file.         Social History Main Topics  . Smoking status: Current Every Day Smoker    Packs/day: 0.25    Types: Cigarettes  . Smokeless tobacco: Never Used     Comment: Pt is on Chantix at present time  . Alcohol use No     Comment: no ETOH since 08/22/12  . Drug use: No     Comment: prescription  opiates from the street daily until Sunday  . Sexual activity: No     Comment: ablation       Other Topics Concern  . Not on file      Social History Narrative  . No narrative on file    Functional Status Survey:  Family History  Problem Relation Age of Onset  . Diabetes Mother   . Bipolar disorder Mother   . CAD Father         Health Maintenance  Topic Date Due  . Hepatitis C Screening  1961/02/07  . TETANUS/TDAP  04/09/1980  . COLONOSCOPY  04/10/2011  . MAMMOGRAM  06/18/2017  . INFLUENZA VACCINE  06/30/2017  . PAP SMEAR  03/27/2018  . HIV Screening  Completed        Allergies  Allergen Reactions  . Demerol Itching and Nausea And Vomiting  . Erythromycin Rash        Outpatient Encounter Prescriptions as of 04/08/2017  Medication Sig  . anastrozole (ARIMIDEX) 1 MG tablet Take 1 tablet (1 mg total) by mouth daily.  . Biotin 1 MG CAPS Take 1 mg by mouth daily.  . cetirizine (ZYRTEC) 10 MG tablet Take 1 tablet (10 mg total) by mouth daily.  . diphenhydrAMINE (BENADRYL) 25 MG tablet Take 25 mg by mouth every 6 (six) hours as needed for allergies.  [name] gabapentin (NEURONTIN) 300 MG capsule Take 1 capsule (300 mg total) by mouth 2 (two) times daily.  [name] ibuprofen (ADVIL,MOTRIN) 800 MG tablet Take 1 tablet (800 mg total) by mouth every 6 (six) hours as needed for headache, mild pain or moderate pain.  [name] lamoTRIgine (LAMICTAL) 200 MG tablet Take 1 tablet (200 mg total) by mouth daily.  . magic mouthwash SOLN Take 5 mLs by mouth 4 (four) times daily as needed for mouth pain.  . Oxcarbazepine (TRILEPTAL) 300 MG tablet Take 1 tablet (300 mg total) by mouth 2 (two) times daily.  . pantoprazole (PROTONIX) 40 MG tablet Take 40 mg by mouth daily.  . potassium chloride (MICRO-K) 10 MEQ CR capsule Take 1 capsule (10 mEq total) by mouth daily.  . QUEtiapine (SEROQUEL) 50 MG tablet Take 1 tablet (50 mg total) by mouth 2 (two) times daily.  . traZODone (DESYREL) 100  MG tablet Take 200 mg by mouth at bedtime.   . [DISCONTINUED] lidocaine-prilocaine (EMLA) cream Apply 1 application topically as needed (prior to accessing port).         Review of Systems  Constitutional: Negative for activity change, appetite change, chills, diaphoresis, fatigue and fever.  HENT: Negative for mouth sores, postnasal drip, rhinorrhea, sinus pain and sore throat.   Respiratory: Negative for apnea, cough, chest tightness, shortness of breath and wheezing.   Cardiovascular: Negative for chest pain, palpitations and leg  swelling.  Gastrointestinal: Negative for abdominal distention, abdominal pain,  diarrhea, nausea and vomiting.  Genitourinary: Negative for dysuria and frequency.  Musculoskeletal: Complains is somewhat diffuse pain she describes as chronic Skin: Negative for rash.  Neurological: Negative for dizziness, syncope, positive for headache she describes thisas chronic l does not really complain of lightheadedness s.  Psychiatric/Behavioral: Positive for situational intermittent anxiety                             Temperature 98.1 pulse 81 respirations 20 blood pressure 123/75  Physical Exam  Constitutional: She is oriented to person, place, and time. She appears well-developed and well-nourished a bit anxious about her pain.  HENT:  Head: Normocephalic.  Mouth/Throat: Oropharynx is clear and moist.  Eyes: Pupils are equal, round, and reactive to light.  Neck: Neck supple.  Cardiovascular: Normal rate, regular rhythm and normal heart sounds.   No murmur heard. Pulmonary/Chest: Effort normal and breath sounds normal. No respiratory distress. She has no wheezes. She has no rales.  Abdominal: Soft. Bowel sounds are normal. She exhibits no distension. There is no tenderness. There is no rebound.  Musculoskeletal: She exhibits no edema.  Neurological: She is alert and oriented to person, place, and time.  No Focal deficit  Skin: Skin is warm  and dry. Craniotomy  site noted iner scalp  Psychiatric: Her mood appears anxious. Her speech is rapid and/or pressured. . Cognition and memory are normal. She expresses impulsivity.     Labs reviewed: Basic Metabolic Panel:  Recent Labs (within last 365 days)   Recent Labs  12/04/16 0622  03/26/17 0436 03/27/17 0441 03/30/17 1021 04/06/17 0822  NA 141  < > 141 140  --  141  K 3.9  < > 3.4* 3.7  --  4.0  CL 111  < > 108 108  --  107  CO2 22  < > 29 28  --  28  GLUCOSE 109*  < > 101* 101*  --  97  BUN 13  < > 6 8  --  8  CREATININE 0.67  < > 0.68 0.69 0.93 0.74  CALCIUM 8.3*  < > 9.1 9.0  --  8.8*  MG 2.2  --   --   --   --   --   < > = values in this interval not displayed.   Liver Function Tests:  Recent Labs (within last 365 days)   Recent Labs  03/16/17 1630 03/24/17 1132 03/26/17 0436  AST 15 16 14*  ALT 11* 12* 12*  ALKPHOS 34* 37* 33*  BILITOT 0.5 0.4 0.2*  PROT 5.5* 5.5* 5.2*  ALBUMIN 3.8 3.5 3.4*     Recent Labs (within last 365 days)  No results for input(s): LIPASE, AMYLASE in the last 8760 hours.    Recent Labs (within last 365 days)   Recent Labs  12/03/16 1609 12/03/16 1843 03/16/17 2100  AMMONIA 20 17 16      CBC on 04/06/2017.  WBC 2.8 hemoglobin 11.3 platelets 166.  Assessment plan.  #1-pain management-this was discussed with Dr. Lyndel Safe via phone  will start tramadol 50 mg every 12 hours when necessary-.  Also will make ibuprofen routine 3 times a day she is currently receiving this when necessary apparently take quite frequently.  She also continues on Neurontin 300 mg twice a day  #2 anxiety with history of bipolar disorder she will be followed by psych  services-secondary  to anxiety was recently started on Ativan when necessary twice a day have written order not to receive the Ativan and tramadol at the same time. She continues on Lamictal and Seroquel  She was pleasant and appropriate today although somewhat anxious  and impulsive which per review of Dr. Steve Rattler note he is not a new finding.  #3 history of metastatic breast cancer she is followed by oncology continues onArmidex  Of note I see a metabolic panel is due to be drawn again for updates on May 17-she continues on low-dose potassium would like to ensure there is stability here potassium was 4.0 on lab done May 8  [id number]-of note greater than 25 minutes spent assessing patient-discussing her status with nursing staff reviewing her chart reviewing her labs and discussing her status with Dr. Lyndel Safe via phone-of note greater than 50% of time spent coordinating plan of care

## 2017-04-12 ENCOUNTER — Other Ambulatory Visit: Payer: Self-pay | Admitting: *Deleted

## 2017-04-12 MED ORDER — TRAMADOL HCL 50 MG PO TABS: ORAL_TABLET | ORAL | 0 refills | Status: DC

## 2017-04-12 NOTE — Telephone Encounter (Signed)
[facility] Nursing #[phone] Fax: [phone]   

## 2017-04-13 ENCOUNTER — Other Ambulatory Visit (HOSPITAL_BASED_OUTPATIENT_CLINIC_OR_DEPARTMENT_OTHER): Payer: Medicaid Other

## 2017-04-13 ENCOUNTER — Ambulatory Visit (HOSPITAL_BASED_OUTPATIENT_CLINIC_OR_DEPARTMENT_OTHER): Payer: Medicaid Other

## 2017-04-13 ENCOUNTER — Encounter: Payer: Self-pay | Admitting: *Deleted

## 2017-04-13 ENCOUNTER — Ambulatory Visit (HOSPITAL_BASED_OUTPATIENT_CLINIC_OR_DEPARTMENT_OTHER): Payer: Medicaid Other | Admitting: Oncology

## 2017-04-13 VITALS — BP 116/77 | HR 88 | Temp 97.5°F | Resp 19 | Ht 65.0 in | Wt 128.0 lb

## 2017-04-13 DIAGNOSIS — C7931 Secondary malignant neoplasm of brain: Secondary | ICD-10-CM | POA: Diagnosis not present

## 2017-04-13 DIAGNOSIS — C7951 Secondary malignant neoplasm of bone: Secondary | ICD-10-CM

## 2017-04-13 DIAGNOSIS — Z170 Estrogen receptor positive status [ER+]: Secondary | ICD-10-CM

## 2017-04-13 DIAGNOSIS — F319 Bipolar disorder, unspecified: Secondary | ICD-10-CM | POA: Diagnosis not present

## 2017-04-13 DIAGNOSIS — C50919 Malignant neoplasm of unspecified site of unspecified female breast: Secondary | ICD-10-CM

## 2017-04-13 DIAGNOSIS — C773 Secondary and unspecified malignant neoplasm of axilla and upper limb lymph nodes: Secondary | ICD-10-CM

## 2017-04-13 DIAGNOSIS — Z5112 Encounter for antineoplastic immunotherapy: Secondary | ICD-10-CM | POA: Diagnosis not present

## 2017-04-13 DIAGNOSIS — C50312 Malignant neoplasm of lower-inner quadrant of left female breast: Secondary | ICD-10-CM

## 2017-04-13 DIAGNOSIS — Z79811 Long term (current) use of aromatase inhibitors: Secondary | ICD-10-CM | POA: Diagnosis not present

## 2017-04-13 LAB — CBC WITH DIFFERENTIAL/PLATELET
BASO%: 1 % (ref 0.0–2.0)
Basophils Absolute: 0 10*3/uL (ref 0.0–0.1)
EOS%: 2.3 % (ref 0.0–7.0)
Eosinophils Absolute: 0.1 10*3/uL (ref 0.0–0.5)
HCT: 38.9 % (ref 34.8–46.6)
HGB: 12.5 g/dL (ref 11.6–15.9)
LYMPH%: 21.6 % (ref 14.0–49.7)
MCH: 30.9 pg (ref 25.1–34.0)
MCHC: 32.1 g/dL (ref 31.5–36.0)
MCV: 96.3 fL (ref 79.5–101.0)
MONO#: 0.2 10*3/uL (ref 0.1–0.9)
MONO%: 5.8 % (ref 0.0–14.0)
NEUT#: 2.2 10*3/uL (ref 1.5–6.5)
NEUT%: 69.3 % (ref 38.4–76.8)
Platelets: 145 10*3/uL (ref 145–400)
RBC: 4.04 10*6/uL (ref 3.70–5.45)
RDW: 12.5 % (ref 11.2–14.5)
WBC: 3.1 10*3/uL — ABNORMAL LOW (ref 3.9–10.3)
lymph#: 0.7 10*3/uL — ABNORMAL LOW (ref 0.9–3.3)

## 2017-04-13 LAB — COMPREHENSIVE METABOLIC PANEL
ALT: 13 U/L (ref 0–55)
AST: 12 U/L (ref 5–34)
Albumin: 3.8 g/dL (ref 3.5–5.0)
Alkaline Phosphatase: 47 U/L (ref 40–150)
Anion Gap: 10 mEq/L (ref 3–11)
BUN: 12.6 mg/dL (ref 7.0–26.0)
CO2: 28 mEq/L (ref 22–29)
Calcium: 9.7 mg/dL (ref 8.4–10.4)
Chloride: 106 mEq/L (ref 98–109)
Creatinine: 0.8 mg/dL (ref 0.6–1.1)
EGFR: 84 mL/min/{1.73_m2} — ABNORMAL LOW (ref >90)
Glucose: 96 mg/dl (ref 70–140)
Potassium: 4.4 mEq/L (ref 3.5–5.1)
Sodium: 144 mEq/L (ref 136–145)
Total Bilirubin: <0.22 mg/dL (ref 0.20–1.20)
Total Protein: 6.2 g/dL — ABNORMAL LOW (ref 6.4–8.3)

## 2017-04-13 MED ORDER — HEPARIN SOD (PORK) LOCK FLUSH 100 UNIT/ML IV SOLN
500.0000 [IU] | Freq: Once | INTRAVENOUS | Status: AC | PRN
  Administered 2017-04-13: 500 [IU]
  Filled 2017-04-13: qty 5

## 2017-04-13 MED ORDER — TRASTUZUMAB CHEMO 150 MG IV SOLR
450.0000 mg | Freq: Once | INTRAVENOUS | Status: AC
  Administered 2017-04-13: 450 mg via INTRAVENOUS
  Filled 2017-04-13: qty 21.43

## 2017-04-13 MED ORDER — SODIUM CHLORIDE 0.9% FLUSH
10.0000 mL | INTRAVENOUS | Status: DC | PRN
  Administered 2017-04-13: 10 mL
  Filled 2017-04-13: qty 10

## 2017-04-13 MED ORDER — SODIUM CHLORIDE 0.9 % IV SOLN
Freq: Once | INTRAVENOUS | Status: AC
  Administered 2017-04-13: 10:00:00 via INTRAVENOUS

## 2017-04-13 MED ORDER — DIPHENHYDRAMINE HCL 25 MG PO CAPS: 25.0000 mg | ORAL_CAPSULE | Freq: Once | ORAL | Status: DC

## 2017-04-13 MED ORDER — ACETAMINOPHEN 325 MG PO TABS: 650.0000 mg | ORAL_TABLET | Freq: Once | ORAL | Status: DC

## 2017-04-13 NOTE — Progress Notes (Signed)
[facility] [employer]  Holiday representative met with patient in the infusion room to offer support.  Patient sated she was doing well, and "felt the best she has felt in over a year".  CSw and patient discussed her Social Security Disability application and need for financial paper work.  Patient plans to contact her family members to look for requested information.  CSw offered additional support and encouraged patient to call with needs or concerns.    Johnnye Lana, MSW, LCSW, OSW-C Clinical Social Worker [facility] [phone]

## 2017-04-13 NOTE — Patient Instructions (Signed)
[facility] Discharge Instructions for Patients Receiving Chemotherapy  Today you received the following: Herceptin   To help prevent nausea and vomiting after your treatment, we encourage you to take your nausea medication as directed.    If you develop nausea and vomiting that is not controlled by your nausea medication, call the clinic.   BELOW ARE SYMPTOMS THAT SHOULD BE REPORTED IMMEDIATELY:  *FEVER GREATER THAN 100.5 F  *CHILLS WITH OR WITHOUT FEVER  NAUSEA AND VOMITING THAT IS NOT CONTROLLED WITH YOUR NAUSEA MEDICATION  *UNUSUAL SHORTNESS OF BREATH  *UNUSUAL BRUISING OR BLEEDING  TENDERNESS IN MOUTH AND THROAT WITH OR WITHOUT PRESENCE OF ULCERS  *URINARY PROBLEMS  *BOWEL PROBLEMS  UNUSUAL RASH Items with * indicate a potential emergency and should be followed up as soon as possible.  Feel free to call the clinic you have any questions or concerns. The clinic phone number is [phone].  Please show the CHEMO ALERT CARD at check-in to the Emergency Department and triage nurse.   

## 2017-04-13 NOTE — Progress Notes (Signed)
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 She has been in and out of the hospital because of confusion. In the most recent hospitalization it was felt she was not competent to make medical decisions. I have discussed her treatments with her son who has health care power of attorney and he has agreed that we should continue her breast cancer treatment so long as there is evidence of effectiveness and she tolerates it well.  With regards to that she had a brain MRI and a CT of the chest in April of this year neither of which shows any evidence of active disease.  She had an echocardiogram also in April which shows a well-preserved ejection fraction.  She received her most recent anti-HER-2 immunotherapy while in the hospital 03/26/2017. She is now ready to continue her regular treatments.  REVIEW OF SYSTEMS: [name] is now living in a skilled nursing facility. She is remarkably tolerance of this staff and has generally good things to say about him even though some of them she says her little difficult. She has learned that if she goes along she will be able to be discharged and have more freedom. This is a totally reasonable approach on her part and is very different from her prior complaining, crying, and inappropriate behavior. She denies any unusual headaches, visual changes, nausea, vomiting, dizziness, or falls. She denies any  change in bowel or bladder habits. A detailed review of systems today was otherwise stable  BREAST CANCER HISTORY: From the original intake note:  [name] was aware of a "lemon sized lump in" her left axilla for about a year before bringing it to medical attention. By then she had developed left breast and left axillary swelling (June 2016). She presented to the local emergency room and had a chest CT scan 06/06/2015 which showed a nodule in the left breast measuring 0.9 cm and questionable left axillary adenopathy. She then proceeded to bilateral diagnostic mammography and left breast ultrasonography 06/19/2015. There were no prior films for comparison (last mammography 12 years prior).. The breast density was category C. Mammography showed in the left breast upper inner quadrant a 7 cm area including a small mass and significant pleomorphic calcifications. Ultrasonography defined the mass as measuring 1.2 cm. The left axilla appeared unremarkable. There was significant skin edema.  Biopsy of the left breast mass 06/19/2015 showed (SP [id number]) an invasive ductal carcinoma, grade 2, estrogen receptor 83% positive, progesterone receptor 26% positive, and HER-2 amplified by immunohistochemstry with a 3+ reading. The patient had biopsies of a separate area in the left breast August of the same year and this showed atypical ductal hyperplasia. (SP [id number]).  Accordingly after appropriate discussion on 08/21/2015 the patient proceeded to left mastectomy with left axillary sentinel lymph node sampling, which, since the lymph nodes were positive, extended to the procedure to left axillary lymph node dissection. The pathology (SP [id number]) showed an invasive ductal carcinoma, grade 3, measuring in excess of 9 cm. There were also skin satellites,  not contiguous with the invasive carcinoma. Margins were clear and ample. There was evidence of lymphovascular invasion. A total of 15 lymph nodes were removed, including 5  sentinel lymph nodes, all of which were positive, so that the final total was 14 out of 15 lymph nodes involved by tumor. There was evidence of extranodal extension. The final pathology was pT4b pN2a, stage IIIB  CA-27-29 and CEA 09/19/2015 were non-informative October 2016.  Unfortunately CT scans of the chest abdomen and pelvis 09/16/2015 showed bony metastases to the right scapula, left iliac crest, and also L4 and T-spine. There were questionable liver cysts which on repeat CT scan 03/02/2016 appear to be a little bit more well-defined, possibly a little larger. There were also some possible right upper lobe lung lesions.  Adjuvant treatment consisted of docetaxel, trastuzumab and pertuzumab, with the final (6th) docetaxel dose given 02/11/2016. She continues on trastuzumab and pertuzumab, with the 11th cycle given 05/05/2016. Echocardiogram 02/26/2016 showed an ejection fraction of 55%. She receives denosumab/Xgeva every 6 weeks.. She also receives radiation, started 06/09/201, to be completed 06/26/2016.  Her subsequent history is as detailed below   PAST MEDICAL HISTORY: Past Medical History:  Diagnosis Date  . Alcohol abuse   . Anemia    during chemo  . Anxiety    At age 76  . Arthritis Dx 2010  . Bipolar disorder ([place])   . Cancer (HCC)    breast  . Chronic pain   . Depression   . Fibromyalgia Dx 2005  . GERD (gastroesophageal reflux disease)   . Headache    hx  migraines  . Opiate dependence ([place])   . Port-a-cath in place   . PTSD (post-traumatic stress disorder)     PAST SURGICAL HISTORY: Past Surgical History:  Procedure Laterality Date  . APPLICATION OF CRANIAL NAVIGATION N/A 08/14/2016   Procedure: APPLICATION OF CRANIAL NAVIGATION;  Surgeon: Erline Levine, MD;  Location: [facility];  Service: Neurosurgery;  Laterality: N/A;  . BREAST RECONSTRUCTION Left    with silicone implant  . CRANIOTOMY N/A 08/14/2016   Procedure: CRANIOTOMY TUMOR EXCISION WITH Lucky Rathke;   Surgeon: Erline Levine, MD;  Location: [facility];  Service: Neurosurgery;  Laterality: N/A;  . FIBULA FRACTURE SURGERY    . MASTECTOMY Left   . RADIOLOGY WITH ANESTHESIA N/A 07/23/2016   Procedure: MRI OF BRAIN WITH AND WITHOUT;  Surgeon: Medication Radiologist, MD;  Location: [facility];  Service: Radiology;  Laterality: N/A;  . right power port placement Right     FAMILY HISTORY Family History  Problem Relation Age of Onset  . Diabetes Mother   . Bipolar disorder Mother   . CAD Father   The patient's father still living, age 66, in [place]. He had prostate cancer at some point in the past. The patient's mother died at age 24 from complications of diabetes. The patient had no brothers, 2 sisters. A paternal grandmother had lung cancer in the setting of tobacco abuse. There is no other history of cancer in the family to her knowledge  GYNECOLOGIC HISTORY:  No LMP recorded. Patient has had an ablation. Menarche approximately age 7. First live birth in 42. The patient is GX P2. She underwent endometrial ablation in 2016.  SOCIAL HISTORY:  The patient is originally from [place]. She has lived in [place] before but more recently was in [place]. She is back here because she could not afford her rent in [place]. She is living here and a temporary situation. She is  divorced. HER-2 children are [name] who lives in [place] and works as a Development worker, community, and [name] who also lives in [place] and works as a Catering manager. The patient has a grandchild, Arelia Longest, 73 years old as of July 2017, living in [place] with his mother. The patient has not established herself with a local church yet.    ADVANCED DIRECTIVES: Not in place; at the 06/03/2016 visit the patient was given the appropriate forms to complete and notarize at her discretion   HEALTH MAINTENANCE: Social History  Substance Use Topics  . Smoking status: Current Every Day Smoker    Packs/day: 0.50    Types: Cigarettes  .  Smokeless tobacco: Never Used     Comment: Pt is on Chantix at present time  . Alcohol use Yes     Comment: no ETOH since 08/22/12     Colonoscopy:  PAP:  Bone density:   Allergies  Allergen Reactions  . Demerol Itching and Nausea And Vomiting  . Erythromycin Rash    Can take zpak    Current Outpatient Prescriptions  Medication Sig Dispense Refill  . cetirizine (ZYRTEC) 10 MG tablet Take 1 tablet (10 mg total) by mouth daily. 30 tablet 11  . dexamethasone (DECADRON) 4 MG tablet Take 1 tablet (4 mg total) by mouth 2 (two) times daily. 60 tablet 0  . diphenoxylate-atropine (LOMOTIL) 2.5-0.025 MG tablet Take 2 tablets by mouth 4 (four) times daily as needed for diarrhea or loose stools. 40 tablet 0  . fluticasone (FLONASE) 50 MCG/ACT nasal spray Place 1 spray into the nose daily.    [name] gabapentin (NEURONTIN) 300 MG capsule Take 2 capsules (600 mg total) by mouth 3 (three) times daily. 300 mg at night for one day, twice daily for one day, then three times daily 180 capsule 2  . hyaluronate sodium (RADIAPLEXRX) GEL Apply 1 application topically.    [name] HYDROcodone-acetaminophen (NORCO) 7.5-325 MG tablet Take 1-2 tablets by mouth every 6 (six) hours as needed for severe pain (give one tab, then if other tab is needed, may give 30 min later.). 60 tablet 0  . ibuprofen (ADVIL,MOTRIN) 200 MG tablet Take 800 mg by mouth every 6 (six) hours as needed for mild pain.    [name] lamoTRIgine (LAMICTAL) 100 MG tablet Take 1.5 tablets (150 mg total) by mouth 2 (two) times daily. 60 tablet 3  . lidocaine-prilocaine (EMLA) cream Apply 1 application topically as needed. 30 g 0  . lithium carbonate 300 MG capsule Take 1 capsule (300 mg total) by mouth 2 (two) times daily with a meal. 60 capsule 3  . Loperamide HCl (IMODIUM PO) Take 1 tablet by mouth daily as needed (diarhea).    . LORazepam (ATIVAN) 0.5 MG tablet Take 1 tablet (0.5 mg total) by mouth every 8 (eight) hours as needed for anxiety. 30 tablet 1  .  ondansetron (ZOFRAN) 8 MG tablet Take 1 tablet (8 mg total) by mouth every 8 (eight) hours as needed for nausea or vomiting. 30 tablet 1  . pantoprazole (PROTONIX) 40 MG tablet Take 1 tablet (40 mg total) by mouth daily. 30 tablet 3  . polyethylene glycol powder (GLYCOLAX/MIRALAX) powder Take 17 g by mouth 2 (two) times daily as needed for mild constipation. (Patient not taking: Reported on 08/13/2016) 3350 g 3  . potassium chloride (K-DUR) 10 MEQ tablet Take 1 tablet (10 mEq total) by mouth daily. 30 tablet 3  . prochlorperazine (COMPAZINE) 10 MG tablet Take 10 mg by mouth daily as  needed for nausea.     [name] tiZANidine (ZANAFLEX) 4 MG tablet Take 1 tablet (4 mg total) by mouth daily. 30 tablet 0  . traMADol (ULTRAM) 50 MG tablet Take 1 tablet (50 mg total) by mouth every 6 (six) hours as needed. 60 tablet 0  . trazodone (DESYREL) 300 MG tablet Take 1 tablet (300 mg total) by mouth at bedtime. 30 tablet 3  . triamcinolone cream (KENALOG) 0.5 % Apply 1 application topically daily as needed.  1  . trolamine salicylate (ASPERCREME) 10 % cream Apply 1 application topically as needed for muscle pain. Reported on 06/01/2016    . varenicline (CHANTIX) 0.5 MG tablet Take 1 tablet (0.5 mg total) by mouth daily. 30 tablet 3  . Vitamin D, Cholecalciferol, 1000 units CAPS Take 1,000 Units by mouth daily.     No current facility-administered medications for this visit.     OBJECTIVE: Middle-aged white woman who appears well  Vitals:   04/13/17 0922  BP: 116/77  Pulse: 88  Resp: 19  Temp: 97.5 F (36.4 C)  TempSrc: Oral  SpO2: 100%  Weight: 128 lb (58.1 kg)  Height: 5' 5"  (1.651 m)  Body mass index is 21.3 kg/m. Sclerae unicteric, pupils round and equal Oropharynx clear and moist No cervical or supraclavicular adenopathy Lungs no rales or rhonchi Heart regular rate and rhythm Abd soft, nontender, positive bowel sounds MSK no focal spinal tenderness, no upper extremity lymphedema Neuro: nonfocal,  well oriented, appropriate affect Breasts: Deferred  LAB RESULTS:  CMP     Component Value Date/Time   NA 140 08/13/2016 0926   NA 139 07/28/2016 1118   K 4.3 08/13/2016 0926   K 4.1 07/28/2016 1118   CL 107 08/13/2016 0926   CO2 25 08/13/2016 0926   CO2 26 07/28/2016 1118   GLUCOSE 96 08/13/2016 0926   GLUCOSE 91 07/28/2016 1118   BUN 8 08/13/2016 0926   BUN 8.6 07/28/2016 1118   CREATININE 0.94 08/13/2016 0926   CREATININE 0.8 07/28/2016 1118   CALCIUM 9.2 08/13/2016 0926   CALCIUM 9.7 07/28/2016 1118   PROT 6.7 07/28/2016 1118   ALBUMIN 4.0 07/28/2016 1118   AST 18 07/28/2016 1118   ALT 13 07/28/2016 1118   ALKPHOS 50 07/28/2016 1118   BILITOT <0.30 07/28/2016 1118   GFRNONAA >60 08/13/2016 0926   GFRAA >60 08/13/2016 0926    INo results found for: SPEP, UPEP  Lab Results  Component Value Date   WBC 5.0 08/13/2016   NEUTROABS 4.2 07/28/2016   HGB 12.9 08/13/2016   HCT 40.8 08/13/2016   MCV 96.9 08/13/2016   PLT 171 08/13/2016      Chemistry      Component Value Date/Time   NA 140 08/13/2016 0926   NA 139 07/28/2016 1118   K 4.3 08/13/2016 0926   K 4.1 07/28/2016 1118   CL 107 08/13/2016 0926   CO2 25 08/13/2016 0926   CO2 26 07/28/2016 1118   BUN 8 08/13/2016 0926   BUN 8.6 07/28/2016 1118   CREATININE 0.94 08/13/2016 0926   CREATININE 0.8 07/28/2016 1118      Component Value Date/Time   CALCIUM 9.2 08/13/2016 0926   CALCIUM 9.7 07/28/2016 1118   ALKPHOS 50 07/28/2016 1118   AST 18 07/28/2016 1118   ALT 13 07/28/2016 1118   BILITOT <0.30 07/28/2016 1118       No results found for: LABCA2  No components found for: LABCA125  No results for input(s): INR  in the last 168 hours.  Urinalysis    Component Value Date/Time   COLORURINE YELLOW 04/18/2013 [place] 04/18/2013 1222   LABSPEC 1.011 04/18/2013 1222   PHURINE 5.5 04/18/2013 1222   GLUCOSEU NEGATIVE 04/18/2013 1222   HGBUR TRACE (A) 04/18/2013 1222   BILIRUBINUR  negative 03/28/2015 1321   KETONESUR NEGATIVE 04/18/2013 1222   PROTEINUR negative 03/28/2015 1321   PROTEINUR NEGATIVE 04/18/2013 1222   UROBILINOGEN 0.2 03/28/2015 1321   UROBILINOGEN 0.2 04/18/2013 1222   NITRITE negative 03/28/2015 1321   NITRITE NEGATIVE 04/18/2013 1222   LEUKOCYTESUR Trace 03/28/2015 1321     STUDIES: Ct Head Wo Contrast  Result Date: 03/16/2017 CLINICAL DATA:  Altered mental status EXAM: CT HEAD WITHOUT CONTRAST TECHNIQUE: Contiguous axial images were obtained from the base of the skull through the vertex without intravenous contrast. COMPARISON:  03/02/2017, 02/25/2017, 12/03/2016 FINDINGS: Brain: No acute territorial infarction, hemorrhage, or new mass lesion is seen. Stable left occipital craniotomy changes. Small focus of encephalomalacia in the left cerebellum as before. Stable ventricle size.  No midline shift. Vascular: No hyperdense vessels.  No unexpected calcification. Skull: Left occipital and suboccipital postsurgical changes. No acute or suspicious bone lesion Sinuses/Orbits: Mild mucosal thickening in the sphenoid and ethmoid sinuses. No acute orbital abnormality. Other: Mild soft tissue thickening or swelling over the left supraorbital region IMPRESSION: No definite CT evidence for acute intracranial abnormality ; stable postsurgical changes of the posterior fossa and left occipital bone. Electronically Signed   By: Donavan Foil M.D.   On: 03/16/2017 19:09   Ct Chest W Contrast  Result Date: 03/18/2017 CLINICAL DATA:  Breast cancer.  Short breath.  Brain metastasis. EXAM: CT CHEST WITH CONTRAST TECHNIQUE: Multidetector CT imaging of the chest was performed during intravenous contrast administration. CONTRAST:  70m ISOVUE-300 IOPAMIDOL (ISOVUE-300) INJECTION 61% COMPARISON:  CT 11/10/2016 FINDINGS: Cardiovascular: Port in RIGHT chest wall with tip the distal SVC. No significant vascular findings. Normal heart size. No pericardial effusion. Mediastinum/Nodes:  No axillary supraclavicular adenopathy. No mediastinal hilar adenopathy Lungs/Pleura: Mild atelectasis lung bases. Mild linear scarring LEFT lung apex. No suspicious nodularity. Pulmonary edema or pulmonary infection. Upper Abdomen: Limited view of the liver, kidneys, pancreas are unremarkable. Normal adrenal glands. Multiple small hepatic hypodensities likely represent benign cysts for not comparison Musculoskeletal: Scatter sclerotic lesions in the spine for stable. Post LEFT mastectomy with breast prosthetic. IMPRESSION: 1. No evidence of breast cancer progression. 2. No acute pulmonary parenchymal findings. 3. Stable sclerotic lesions in the thoracic spine. Electronically Signed   By: SSuzy BouchardM.D.   On: 03/18/2017 12:14   Dg Chest Port 1 View  Result Date: 03/16/2017 CLINICAL DATA:  Altered mental status. Metastatic left breast cancer. EXAM: PORTABLE CHEST 1 VIEW COMPARISON:  02/25/2017 chest radiograph. FINDINGS: Right subclavian MediPort terminates at the cavoatrial junction. Stable cardiomediastinal silhouette with normal heart size. No pneumothorax. No pleural effusion. Lungs appear clear, with no acute consolidative airspace disease and no pulmonary edema. Asymmetric haziness overlying the left lower lung is due to overlying left breast prosthesis. IMPRESSION: No active cardiopulmonary disease. Electronically Signed   By: JIlona SorrelM.D.   On: 03/16/2017 16:31     ELIGIBLE FOR AVAILABLE RESEARCH PROTOCOL: no  ASSESSMENT: 56y.o. 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  (2) status post left  mastectomy and axillary lymph node dissection  with immediate expander placement 07/18/2015 for an mpT4 pN2,stage IIIB invasive ductal carcinoma, grade 3, with negative margins.  (a) definitive implant  exchange to be scheduled in December   METASTATIC DISEASE: October 2016  (3) CT scan of the chest abdomen and pelvis  09/16/2015 shows metastatic lesions in the right scapula, left iliac crest, L4, and T spine. There were questionable liver cysts, with repeat CT scan 03/02/2016 showing possible right upper lobe lung lesions and possibly increased liver lesions  (a) CT scan of the chest 06/17/2016 shows no active disease in the lungs or liver  (b) Bone scan July 2017 showed no evidence of bony metastatic disease   (c) head CT 07/08/2016 showed a cerebellar lesion, confirmed by MRI 07/23/2016, status post craniotomy 08/14/2016, confirming a metastatic deposit which was estrogen and progesterone receptor negative, HER-2 amplified with a signals ratio of 7.16, number per cell 13.25  (d) CA 27-29 not informative  (4) received docetaxel every 3 weeks 6 together with trastuzumab and pertuzumab, last docetaxel dose 02/11/2016  (5) adjuvant radiation7/03/2016 to 06/26/2016 at [facility]: 1. The Left chest wall was treated to 23.4 Gy in 13 fractions at 1.8 Gy per fraction. 2. The Left chest wall was boosted to 10 Gy in 5 fractions at 2 Gy per fraction. 3. The Left Sclav/PAB was treated to 23.4 Gy in 13 fractions at 1.8 Gy per fraction.  Including the patient's treatment in [place] (received 15 fractions per Dr. Maryan Rued near [place], [place]), the patient received 50.4 Gy to the left chest wall and supraclavicular region.   (6) started trastuzumab and pertuzumab October 2016, continuing every 3 weeks,  (a) echocardiogram 02/26/2016 showed a well preserved ejection fraction  (b) echocardiogram 07/01/2016 shows an ejection fraction in the 60-65%   (c) pertuzumab discontinued 08/25/2016 with uncontrolled diarrhea  (d) echocardiogram 11/11/2016 showed an ejection fraction in the 60-65%  (e) echocardiogram 03/03/2017 shows an ejection fraction of 60-65%    (7) started denosumab/Xgeva October 2017,  repeated every 6 weeks  (8) started anastrozole October 2017   (a) bone scan 11/10/2016 shows no active disease  (b) chest CT scan 11/10/2016 stable, with no evidence of active disease  (9) history of bipolar disorder  (a) lithium level checked every 6 weeks  (10) mild anemia with a significant drop in the MCV, ferritin 10 06/03/2016,   (a) Feraheme given 06/12/2016 and 06/18/2016  (11) tobacco abuse: Chantix started 06/18/2016   (12) 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  (a) brain irradiation10/19/17-11/08/17: Whole brain/ 35 Gy in 14 fractions   (b) repeat brain MRI obtained 12/10/2016 shows no active disease in the brain  (c) repeat brain MRI 03/02/2017 shows no evidence of residual or recurrent disease   PLAN: I spent approximately 30 minutes with Warren Lacy with most of that time spent discussing her complex problems.   Tarae i looks the best I have seen her and she has clearly benefit from being in a more structured environment. Unfortunately this cannot last indefinitely and she likely will be discharged back to the prior environment.  I think if we are going to have some long-term success from her psychiatric 0.2 view she will need to have specific follow-up. I can help her stay a breast of those visits, but they will need to be set up from her current SNF and I have put that in my note to them today.  Otherwise from a breast cancer  point of view the plan will be to continue anastrozole daily and trastuzumab every 4 weeks.  She will need a repeat echocardiogram in 3-6 months. I will set her up for repeat scans in 3 months as well. She will need to have a brain MRI in approximately 4 months unless symptoms develop before that time  Incidentally she is now clearly competent. I think she needs a formal psychiatric evaluation to document that. That was also requested  Chauncey Cruel, MD Medical Oncology and Hematology [facility] [address] [place], [place] [address] Tel. [phone]    Fax. [phone]

## 2017-04-14 LAB — CANCER ANTIGEN 27.29: CA 27.29: 7.7 U/mL (ref 0.0–38.6)

## 2017-04-14 LAB — LITHIUM LEVEL: Lithium: <0.1 mmol/L — ABNORMAL LOW (ref 0.6–1.2)

## 2017-04-15 ENCOUNTER — Other Ambulatory Visit: Payer: Self-pay

## 2017-04-15 ENCOUNTER — Non-Acute Institutional Stay (SKILLED_NURSING_FACILITY): Payer: Medicaid Other | Admitting: Internal Medicine

## 2017-04-15 ENCOUNTER — Encounter (HOSPITAL_COMMUNITY)
Admission: RE | Admit: 2017-04-15 | Discharge: 2017-04-15 | Disposition: A | Discharge status: Home / Self Care | Payer: Medicaid Other | Source: Skilled Nursing Facility | Attending: Internal Medicine | Admitting: Internal Medicine

## 2017-04-15 ENCOUNTER — Encounter: Payer: Self-pay | Admitting: Internal Medicine

## 2017-04-15 DIAGNOSIS — F3130 Bipolar disorder, current episode depressed, mild or moderate severity, unspecified: Secondary | ICD-10-CM

## 2017-04-15 DIAGNOSIS — G9340 Encephalopathy, unspecified: Secondary | ICD-10-CM

## 2017-04-15 DIAGNOSIS — N390 Urinary tract infection, site not specified: Secondary | ICD-10-CM | POA: Diagnosis not present

## 2017-04-15 DIAGNOSIS — C50919 Malignant neoplasm of unspecified site of unspecified female breast: Secondary | ICD-10-CM

## 2017-04-15 DIAGNOSIS — I10 Essential (primary) hypertension: Secondary | ICD-10-CM | POA: Insufficient documentation

## 2017-04-15 LAB — CBC
HCT: 36.7 % (ref 36.0–46.0)
Hemoglobin: 12 g/dL (ref 12.0–15.0)
MCH: 31.3 pg (ref 26.0–34.0)
MCHC: 32.7 g/dL (ref 30.0–36.0)
MCV: 95.6 fL (ref 78.0–100.0)
Platelets: 161 10*3/uL (ref 150–400)
RBC: 3.84 MIL/uL — ABNORMAL LOW (ref 3.87–5.11)
RDW: 12.6 % (ref 11.5–15.5)
WBC: 3 10*3/uL — ABNORMAL LOW (ref 4.0–10.5)

## 2017-04-15 LAB — BASIC METABOLIC PANEL
Anion gap: 6 (ref 5–15)
BUN: 13 mg/dL (ref 6–20)
CO2: 30 mmol/L (ref 22–32)
Calcium: 9 mg/dL (ref 8.9–10.3)
Chloride: 104 mmol/L (ref 101–111)
Creatinine, Ser: 0.77 mg/dL (ref 0.44–1.00)
GFR calc Af Amer: >60 mL/min (ref >60)
GFR calc non Af Amer: >60 mL/min (ref >60)
Glucose, Bld: 97 mg/dL (ref 65–99)
Potassium: 4 mmol/L (ref 3.5–5.1)
Sodium: 140 mmol/L (ref 135–145)

## 2017-04-15 MED ORDER — CLONAZEPAM 0.5 MG PO TABS: 0.5000 mg | ORAL_TABLET | Freq: Every day | ORAL | 5 refills | Status: DC | PRN

## 2017-04-15 MED ORDER — TRAMADOL HCL 50 MG PO TABS: ORAL_TABLET | ORAL | 1 refills | Status: DC

## 2017-04-15 NOTE — Progress Notes (Signed)
Location:   [facility] Room Number: 140/D Place of Service:  SNF (31)  Provider: Veleta Miners  PCP: Arnoldo Morale, MD Patient Care Team: Arnoldo Morale, MD as PCP - General (Family Medicine) Kyung Rudd, MD as Consulting Physician (Radiation Oncology) Erline Levine, MD as Consulting Physician (Neurosurgery)  Extended Emergency Contact Information Primary Emergency Contact: Hamiliton,Paige Address: [address]          [place], [place] [address] Johnnette Litter of [place] Phone: [phone] Relation: Sister Secondary Emergency Contact: Kermit Balo [place] of [place] Phone: [phone] Relation: Other  Code Status: Full Code Goals of care:  Advanced Directive information Advanced Directives 04/15/2017  Does Patient Have a Medical Advance Directive? Yes  Type of Advance Directive (No Data)  Does patient want to make changes to medical advance directive? No - Patient declined  Copy of [place] in Chart? -  Would patient like information on creating a medical advance directive? No - Patient declined     Allergies  Allergen Reactions  . Demerol Itching and Nausea And Vomiting  . Erythromycin Rash    Chief Complaint  Patient presents with  . Discharge Note    HPI:  56 y.o. female seen today for discharge from facility   Patient has h/o Alcohol abuse, Bipolar disorder, Breast cancer with Brain Metastasis in remission, Status post mastectomy and axillary node dissection, suboccipital craniectomy for resection of posterior fossa metastasis, radiation to brain and breast. Continues to be on Herceptin and Arimidex.  Patient was admitted to the hospital for increased confusion , hallucinations and weakness. Her CT scan of head was negative. She did have UTI which was treated with Antibiotics.It was thought that her problems were related to her Psych meds. And she Pschy evaluated her and made some changes to her meds  including stopping Lithium and starting her on Trileptal. She was discharged to facility with plan for her to stay here as she did not have place to go.  Patient has done well in the facility. It seemed initially that she would not have place to go but since then patient has decided to go to this transitional place where she was before. With help of her sons.  She was evaluated by Dr [name] her Oncologist and they will continue her anastrozole daily and trastuzumab every 4 weeks. She will need repeat scans and Echo in 3 months.  She was also seen by Arna Medici the NP for Psych and I d/w her. Patient was restarted on Litihium by her. Patient is doing well. Had c/o headache and wanted Ultram. She still starts crying very easily but is ready to go home. She is walking around in the facility without any help.      Past Medical History:  Diagnosis Date  . Alcohol abuse   . Anemia    during chemo  . Anxiety    At age 72  . Arthritis Dx 2010  . Bipolar disorder ([place])   . Cancer ([place])    breast mets to brain  . Chronic pain   . Complication of anesthesia   . Depression   . Fibromyalgia Dx 2005  . GERD (gastroesophageal reflux disease)   . Headache    hx  migraines  . Opiate dependence ([place])   . PONV (postoperative nausea and vomiting)   . Port-a-cath in place   . PTSD (post-traumatic stress disorder)     Past Surgical History:  Procedure Laterality Date  . APPLICATION  OF CRANIAL NAVIGATION N/A 08/14/2016   Procedure: APPLICATION OF CRANIAL NAVIGATION;  Surgeon: Erline Levine, MD;  Location: [facility];  Service: Neurosurgery;  Laterality: N/A;  . BREAST RECONSTRUCTION Left    with silicone implant  . CRANIOTOMY N/A 08/14/2016   Procedure: CRANIOTOMY TUMOR EXCISION WITH Lucky Rathke;  Surgeon: Erline Levine, MD;  Location: [facility];  Service: Neurosurgery;  Laterality: N/A;  . FIBULA FRACTURE SURGERY Left   . MASTECTOMY Left   . RADIOLOGY WITH ANESTHESIA N/A 07/23/2016    Procedure: MRI OF BRAIN WITH AND WITHOUT;  Surgeon: Medication Radiologist, MD;  Location: [facility];  Service: Radiology;  Laterality: N/A;  . RADIOLOGY WITH ANESTHESIA N/A 09/08/2016   Procedure: MRI OF BRAIN WITH AND WITHOUT CONTRAST;  Surgeon: Medication Radiologist, MD;  Location: [facility];  Service: Radiology;  Laterality: N/A;  . RADIOLOGY WITH ANESTHESIA N/A 12/10/2016   Procedure: MRI OF BRAIN WITH AND WITHOUT;  Surgeon: Medication Radiologist, MD;  Location: [facility];  Service: Radiology;  Laterality: N/A;  . RADIOLOGY WITH ANESTHESIA N/A 03/02/2017   Procedure: MRI of BRAIN W and W/OUT CONTRAST;  Surgeon: Medication Radiologist, MD;  Location: [facility];  Service: Radiology;  Laterality: N/A;  . right power port placement Right       reports that she has been smoking Cigarettes.  She has been smoking about 0.25 packs per day. She has never used smokeless tobacco. She reports that she does not drink alcohol or use drugs. Social History   Social History  . Marital status: Single    Spouse name: N/A  . Number of children: N/A  . Years of education: N/A   Occupational History  . Not on file.   Social History Main Topics  . Smoking status: Current Every Day Smoker    Packs/day: 0.25    Types: Cigarettes  . Smokeless tobacco: Never Used     Comment: Pt is on Chantix at present time  . Alcohol use No     Comment: no ETOH since 08/22/12  . Drug use: No     Comment: prescription opiates from the street daily until Sunday  . Sexual activity: No     Comment: ablation   Other Topics Concern  . Not on file   Social History Narrative  . No narrative on file   Functional Status Survey:    Allergies  Allergen Reactions  . Demerol Itching and Nausea And Vomiting  . Erythromycin Rash    Pertinent  Health Maintenance Due  Topic Date Due  . COLONOSCOPY  04/10/2011  . MAMMOGRAM  06/18/2017  . INFLUENZA VACCINE  06/30/2017  . PAP SMEAR  03/27/2018    Medications: Outpatient Encounter  Prescriptions as of 04/15/2017  Medication Sig  . anastrozole (ARIMIDEX) 1 MG tablet Take 1 tablet (1 mg total) by mouth daily.  . Biotin 1 MG CAPS Take 1 mg by mouth daily.  . cetirizine (ZYRTEC) 10 MG tablet Take 1 tablet (10 mg total) by mouth daily.  . cholecalciferol (VITAMIN D) 1000 units tablet Take 1,000 Units by mouth daily.  . clonazePAM (KLONOPIN) 0.5 MG tablet Take 1 tablet (0.5 mg total) by mouth daily as needed for anxiety.  . diphenhydrAMINE (BENADRYL) 25 MG tablet Take 25 mg by mouth every 6 (six) hours as needed for allergies.  [name] gabapentin (NEURONTIN) 300 MG capsule Take 1 capsule (300 mg total) by mouth 2 (two) times daily.  [name] ibuprofen (ADVIL,MOTRIN) 800 MG tablet Take 800 mg by mouth 3 (  three) times daily.  [name] lamoTRIgine (LAMICTAL) 200 MG tablet Take 1 tablet (200 mg total) by mouth daily.  [name] lithium 300 MG tablet Take 300 mg by mouth 2 (two) times daily.  . nicotine (NICODERM CQ - DOSED IN MG/24 HR) 7 mg/24hr patch Place 7 mg onto the skin daily.  . Oxcarbazepine (TRILEPTAL) 300 MG tablet Take 1 tablet (300 mg total) by mouth 2 (two) times daily.  . pantoprazole (PROTONIX) 40 MG tablet Take 40 mg by mouth daily.  . polyethylene glycol (MIRALAX / GLYCOLAX) packet Take 17 g by mouth daily.  . potassium chloride (MICRO-K) 10 MEQ CR capsule Take 1 capsule (10 mEq total) by mouth daily.  . QUEtiapine (SEROQUEL) 50 MG tablet Take 1 tablet (50 mg total) by mouth 2 (two) times daily.  . traMADol (ULTRAM) 50 MG tablet Take one tablet by mouth 4 times daily as needed for pain.  . traZODone (DESYREL) 100 MG tablet Take 200 mg by mouth at bedtime.   . [DISCONTINUED] ibuprofen (ADVIL,MOTRIN) 800 MG tablet Take 1 tablet (800 mg total) by mouth every 6 (six) hours as needed for headache, mild pain or moderate pain. (Patient taking differently: Take 800 mg by mouth 3 (three) times daily. )  . [DISCONTINUED] LORazepam (ATIVAN) 0.5 MG tablet Take 0.5 mg by mouth 2 (two) times daily as needed  for anxiety.  . [DISCONTINUED] magic mouthwash SOLN Take 5 mLs by mouth 4 (four) times daily as needed for mouth pain.   No facility-administered encounter medications on file as of 04/15/2017.      Review of Systems  Vitals:   04/15/17 1327  BP: (!) 100/58  Pulse: 96  Resp: 20  Temp: 98.4 F (36.9 C)  TempSrc: Oral  Weight: 128 lb 9.6 oz (58.3 kg)  Height: 5\' 5"  (1.651 m)   Body mass index is 21.4 kg/m. Physical Exam Constitutional: She is oriented to person, place, and time. She appears well-developed and well-nourished.  HENT:  Head: Normocephalic.  Mouth/Throat: Oropharynx is clear and moist.  Eyes: Pupils are equal, round, and reactive to light.  Neck: Neck supple.  Cardiovascular: Normal rate, regular rhythm and normal heart sounds.   No murmur heard. Pulmonary/Chest: Effort normal and breath sounds normal. No respiratory distress. She has no wheezes. She has no rales.  Abdominal: Soft. Bowel sounds are normal. She exhibits no distension. There is no tenderness. There is no rebound.  Musculoskeletal: She exhibits no edema.  Neurological: She is alert and oriented to person, place, and time.  No Focal deficit  Skin: Skin is warm and dry.    Psychiatric:  Still cries very easily but Less anxious then Before    Labs reviewed: Basic Metabolic Panel:  Recent Labs  12/04/16 0622  03/27/17 0441  04/06/17 0822 04/13/17 0909 04/15/17 0700  NA 141  < > 140  --  141 144 140  K 3.9  < > 3.7  --  4.0 4.4 4.0  CL 111  < > 108  --  107  --  104  CO2 22  < > 28  --  28 28 30   GLUCOSE 109*  < > 101*  --  97 96 97  BUN 13  < > 8  --  8 12.6 13  CREATININE 0.67  < > 0.69  < > 0.74 0.8 0.77  CALCIUM 8.3*  < > 9.0  --  8.8* 9.7 9.0  MG 2.2  --   --   --   --   --   --   < > =  values in this interval not displayed. Liver Function Tests:  Recent Labs  03/24/17 1132 03/26/17 0436 04/13/17 0909  AST 16 14* 12  ALT 12* 12* 13  ALKPHOS 37* 33* 47  BILITOT 0.4 0.2*  <0.22  PROT 5.5* 5.2* 6.2*  ALBUMIN 3.5 3.4* 3.8   No results for input(s): LIPASE, AMYLASE in the last 8760 hours.  Recent Labs  12/03/16 1609 12/03/16 1843 03/16/17 2100  AMMONIA 20 17 16    CBC:  Recent Labs  03/24/17 1132 03/26/17 0436  04/06/17 0822 04/13/17 0909 04/15/17 0700  WBC 3.4* 3.8*  < > 2.8* 3.1* 3.0*  NEUTROABS 2.2 2.2  --   --  2.2  --   HGB 11.8* 11.4*  < > 11.3* 12.5 12.0  HCT 36.7 35.1*  < > 35.0* 38.9 36.7  MCV 95.8 94.6  < > 94.9 96.3 95.6  PLT 190 179  < > 166 145 161  < > = values in this interval not displayed. Cardiac Enzymes: No results for input(s): CKTOTAL, CKMB, CKMBINDEX, TROPONINI in the last 8760 hours. BNP: Invalid input(s): POCBNP CBG:  Recent Labs  04/04/17 0750 04/07/17 0746 04/07/17 1159  GLUCAP 93 96 88    Procedures and Imaging Studies During Stay: Ct Head Wo Contrast  Result Date: 03/16/2017 CLINICAL DATA:  Altered mental status EXAM: CT HEAD WITHOUT CONTRAST TECHNIQUE: Contiguous axial images were obtained from the base of the skull through the vertex without intravenous contrast. COMPARISON:  03/02/2017, 02/25/2017, 12/03/2016 FINDINGS: Brain: No acute territorial infarction, hemorrhage, or new mass lesion is seen. Stable left occipital craniotomy changes. Small focus of encephalomalacia in the left cerebellum as before. Stable ventricle size.  No midline shift. Vascular: No hyperdense vessels.  No unexpected calcification. Skull: Left occipital and suboccipital postsurgical changes. No acute or suspicious bone lesion Sinuses/Orbits: Mild mucosal thickening in the sphenoid and ethmoid sinuses. No acute orbital abnormality. Other: Mild soft tissue thickening or swelling over the left supraorbital region IMPRESSION: No definite CT evidence for acute intracranial abnormality ; stable postsurgical changes of the posterior fossa and left occipital bone. Electronically Signed   By: Donavan Foil M.D.   On: 03/16/2017 19:09   Ct Chest  W Contrast  Result Date: 03/18/2017 CLINICAL DATA:  Breast cancer.  Short breath.  Brain metastasis. EXAM: CT CHEST WITH CONTRAST TECHNIQUE: Multidetector CT imaging of the chest was performed during intravenous contrast administration. CONTRAST:  74mL ISOVUE-300 IOPAMIDOL (ISOVUE-300) INJECTION 61% COMPARISON:  CT 11/10/2016 FINDINGS: Cardiovascular: Port in RIGHT chest wall with tip the distal SVC. No significant vascular findings. Normal heart size. No pericardial effusion. Mediastinum/Nodes: No axillary supraclavicular adenopathy. No mediastinal hilar adenopathy Lungs/Pleura: Mild atelectasis lung bases. Mild linear scarring LEFT lung apex. No suspicious nodularity. Pulmonary edema or pulmonary infection. Upper Abdomen: Limited view of the liver, kidneys, pancreas are unremarkable. Normal adrenal glands. Multiple small hepatic hypodensities likely represent benign cysts for not comparison Musculoskeletal: Scatter sclerotic lesions in the spine for stable. Post LEFT mastectomy with breast prosthetic. IMPRESSION: 1. No evidence of breast cancer progression. 2. No acute pulmonary parenchymal findings. 3. Stable sclerotic lesions in the thoracic spine. Electronically Signed   By: Suzy Bouchard M.D.   On: 03/18/2017 12:14    Assessment/Plan:   Acute Encephalopathy Patient has done well in facility and is now competent to go home with help of her Sons.  Bipolar Disorder D/W Vaughan Basta She is restarted on Lithium as per patient request. She says she has been on Lithium for Long  time and nothing else works for her. We will also discharge on Trileptal and Seroquel. 2 new meds. She will follow up with her Psychiatrist to see if any  Of these meds need tapering. She is doing well oin them right now.  Metastatic breast cancer  Follow up with Dr [name] No Changes now. Follow up in 3 months On Arimidex   Acute lower UTI Was treated with Antibiotics. Asymptomatic now  Tobacco Abuse Patient is not  smoking anymore at least right now. On Nicotine patch  Future labs/tests needed:   Patient need to follow with her Psychiatrist and PCP Meds for 4 weeks would be given till she makes her to her appointment. All her meds were d/w her today by me. Patient aware of 2 new Meds.  Discharge Planning Spend more then 30 mins to D/W her , Nurse and Psychiatry.

## 2017-04-15 NOTE — Telephone Encounter (Signed)
RX faxed to [facility] @ [phone]. Phone number [phone]  

## 2017-04-16 ENCOUNTER — Ambulatory Visit: Payer: Self-pay

## 2017-04-16 ENCOUNTER — Other Ambulatory Visit: Payer: Self-pay

## 2017-04-21 ENCOUNTER — Encounter: Payer: Self-pay | Admitting: Physician Assistant

## 2017-04-21 ENCOUNTER — Telehealth: Payer: Self-pay | Admitting: Family Medicine

## 2017-04-21 ENCOUNTER — Ambulatory Visit: Payer: Medicaid Other | Attending: Internal Medicine | Admitting: Physician Assistant

## 2017-04-21 VITALS — BP 115/81 | HR 82 | Temp 97.7°F | Wt 127.2 lb

## 2017-04-21 DIAGNOSIS — F319 Bipolar disorder, unspecified: Secondary | ICD-10-CM | POA: Insufficient documentation

## 2017-04-21 DIAGNOSIS — G8929 Other chronic pain: Secondary | ICD-10-CM | POA: Diagnosis not present

## 2017-04-21 DIAGNOSIS — J302 Other seasonal allergic rhinitis: Secondary | ICD-10-CM | POA: Diagnosis not present

## 2017-04-21 DIAGNOSIS — C7931 Secondary malignant neoplasm of brain: Secondary | ICD-10-CM

## 2017-04-21 DIAGNOSIS — R51 Headache: Secondary | ICD-10-CM | POA: Diagnosis not present

## 2017-04-21 DIAGNOSIS — R410 Disorientation, unspecified: Secondary | ICD-10-CM | POA: Diagnosis not present

## 2017-04-21 DIAGNOSIS — R269 Unspecified abnormalities of gait and mobility: Secondary | ICD-10-CM | POA: Diagnosis not present

## 2017-04-21 DIAGNOSIS — Z79899 Other long term (current) drug therapy: Secondary | ICD-10-CM | POA: Diagnosis not present

## 2017-04-21 DIAGNOSIS — K219 Gastro-esophageal reflux disease without esophagitis: Secondary | ICD-10-CM | POA: Diagnosis not present

## 2017-04-21 DIAGNOSIS — M797 Fibromyalgia: Secondary | ICD-10-CM | POA: Diagnosis not present

## 2017-04-21 DIAGNOSIS — F4310 Post-traumatic stress disorder, unspecified: Secondary | ICD-10-CM | POA: Diagnosis not present

## 2017-04-21 MED ORDER — LORATADINE 10 MG PO TABS: 10.0000 mg | ORAL_TABLET | Freq: Every day | ORAL | 11 refills | Status: DC

## 2017-04-21 NOTE — Telephone Encounter (Signed)
Patient called the office asking to speak with provider in order to get refills on clonazePAM (KLONOPIN) 0.5 MG tablet. Pt stated that when she was discharged from hospital, she was only given 14 tablets and she will be out soon.   Thank you.

## 2017-04-21 NOTE — Progress Notes (Signed)
Patient ID: [name], female   DOB: 1961-05-05, 56 y.o.   MRN: [id number]   [name], is a 56 y.o. female  JAS:[id number]  ALP:[id number]  DOB - 04/25/1961  Subjective:  Chief Complaint and HPI:  [name] is a 56 y.o. female here today for a follow up visit After a recent hospitalization.  Discharge summary is not in Epic at the time I am seeing her. Per notes that are in EPIC: Her CA has been found to be in remission.  She has an appointment with Dr. Vevelyn Pat today at [facility]. She has repeat scans in August.    She was in the parking lot of our office on 04/19/2017 when she lost her balance after standing up too quickly, lost her footing, then slid down the side of the car and scraped her arm.  Otherwise no injuries.  She denies head injury or LOC.    Today she c/o seasonal allergies.  Zyrtec and flonase not helping although, she only resumed taking them 3 days ago.  Most recent labs were improving/stable. She denies any UTI s/sx.   From most recent nursing home note/Dr Lyndel Safe on 04/15/2017: Nursing Home   04/15/2017 [facility]  Virgie Dad, MD  Internal Medicine   Acute encephalopathy +3 more  Dx   Discharge Note  Reason for Visit   Additional Documentation   Vitals:   BP  100/58   Pulse 96   Temp 98.4 F (36.9 C) (Oral)   Resp 20   Ht 5\' 5"  (1.651 m)   Wt 128 lb 9.6 oz (58.3 kg)   BMI 21.40 kg/m   BSA 1.64 m   Flowsheets:   Custom Formula Data,   MEWS Score,   Anthropometrics,   Nursing Home Patient Info,   Healthcare Directives     Encounter Info:   Billing Info,   History,   Allergies,   Detailed Report     All Notes   Progress Notes by Virgie Dad, MD at 04/15/2017 12:50 PM   Author: Virgie Dad, MD Author Type: Physician Filed: 04/15/2017 5:07 PM  Note Status: Signed Cosign: Cosign Not Required Encounter Date: 04/15/2017  Editor: Virgie Dad, MD (Physician)  Prior Versions: 1. Oralia Manis, CMA  (Certified Medical Assistant) at 04/15/2017 1:48 PM - Sign at close encounter  Expand All Collapse All    Location:   [facility] Room Number: 140/D Place of Service:  SNF (31)  Provider: Veleta Miners  PCP: Arnoldo Morale, MD Patient Care Team: Arnoldo Morale, MD as PCP - General (Family Medicine) Kyung Rudd, MD as Consulting Physician (Radiation Oncology) Erline Levine, MD as Consulting Physician (Neurosurgery)  Extended Emergency Contact Information Primary Emergency Contact: Hamiliton,Paige Address: [address]          [place], [place] [address] [place] Phone: [phone] Relation: Sister Secondary Emergency Contact: Kermit Balo [place] Phone: [phone] Relation: Other  Code Status: Full Code Goals of care:  Advanced Directive information Advanced Directives 04/15/2017  Does Patient Have a Medical Advance Directive? Yes  Type of Advance Directive (No Data)  Does patient want to make changes to medical advance directive? No - Patient declined  Copy of [place] in Chart? -  Would patient like information on creating a medical advance directive? No - Patient declined         Allergies  Allergen Reactions  . Demerol Itching and Nausea  And Vomiting  . Erythromycin Rash       Chief Complaint  Patient presents with  . Discharge Note    HPI:  56 y.o. female seen today for discharge from facility   Patient has h/o Alcohol abuse, Bipolar disorder, Breast cancer with Brain Metastasis in remission, Status post mastectomy and axillary node dissection, suboccipital craniectomy for resection of posterior fossa metastasis, radiation to brain and breast. Continues to be on Herceptin and Arimidex.  Patient was admitted to the hospital for increased confusion , hallucinations and weakness. Her CT scan of head was negative. She did have UTI which was treated with  Antibiotics.It was thought that her problems were related to her Psych meds. And she Pschy evaluated her and made some changes to her meds including stopping Lithium and starting her on Trileptal. She was discharged to facility with plan for her to stay here as she did not have place to go.  Patient has done well in the facility. It seemed initially that she would not have place to go but since then patient has decided to go to this transitional place where she was before. With help of her sons.  She was evaluated by Dr [name] her Oncologist and they will continue her anastrozole daily and trastuzumab every 4 weeks. She will need repeat scans and Echo in 3 months.  She was also seen by Arna Medici the NP for Psych and I d/w her. Patient was restarted on Litihium by her. Patient is doing well. Had c/o headache and wanted Ultram. She still starts crying very easily but is ready to go home. She is walking around in the facility without any help.       ROS:   Constitutional:  No f/c, No night sweats, No unexplained weight loss. EENT:  No vision changes, No blurry vision, No hearing changes. No mouth, throat, or ear problems.  Respiratory: No cough, No SOB Cardiac: No CP, no palpitations GI:  No abd pain, No N/V/D. GU: No Urinary s/sx Musculoskeletal: No joint pain Neuro: + headache, no dizziness, + motor weakness, ataxic gait(none are new) Skin: No rash Endocrine:  No polydipsia. No polyuria.  Psych: Denies SI/HI  No problems updated.  ALLERGIES: Allergies  Allergen Reactions  . Demerol Itching and Nausea And Vomiting  . Erythromycin Rash    PAST MEDICAL HISTORY: Past Medical History:  Diagnosis Date  . Alcohol abuse   . Anemia    during chemo  . Anxiety    At age 56  . Arthritis Dx 2010  . Bipolar disorder ([place])   . Cancer ([place])    breast mets to brain  . Chronic pain   . Complication of anesthesia   . Depression   . Fibromyalgia Dx 2005  . GERD  (gastroesophageal reflux disease)   . Headache    hx  migraines  . Opiate dependence ([place])   . PONV (postoperative nausea and vomiting)   . Port-a-cath in place   . PTSD (post-traumatic stress disorder)     MEDICATIONS AT HOME: Prior to Admission medications   Medication Sig Start Date End Date Taking? Authorizing Provider  anastrozole (ARIMIDEX) 1 MG tablet Take 1 tablet (1 mg total) by mouth daily. 08/25/16  Yes Magrinat, Virgie Dad, MD  Biotin 1 MG CAPS Take 1 mg by mouth daily.   Yes [provider]  cholecalciferol (VITAMIN D) 1000 units tablet Take 1,000 Units by mouth daily.   Yes [provider]  clonazePAM (KLONOPIN) 0.5  MG tablet Take 1 tablet (0.5 mg total) by mouth daily as needed for anxiety. 04/15/17  Yes Lauree Chandler, NP  diphenhydrAMINE (BENADRYL) 25 MG tablet Take 25 mg by mouth every 6 (six) hours as needed for allergies.   Yes [provider]  gabapentin (NEURONTIN) 300 MG capsule Take 1 capsule (300 mg total) by mouth 2 (two) times daily. 04/06/17  Yes Short, Noah Delaine, MD  ibuprofen (ADVIL,MOTRIN) 800 MG tablet Take 800 mg by mouth 3 (three) times daily.   Yes [provider]  lamoTRIgine (LAMICTAL) 200 MG tablet Take 1 tablet (200 mg total) by mouth daily. 04/07/17  Yes Short, Noah Delaine, MD  lithium 300 MG tablet Take 300 mg by mouth 2 (two) times daily.   Yes [provider]  nicotine (NICODERM CQ - DOSED IN MG/24 HR) 7 mg/24hr patch Place 7 mg onto the skin daily.   Yes [provider]  Oxcarbazepine (TRILEPTAL) 300 MG tablet Take 1 tablet (300 mg total) by mouth 2 (two) times daily. 04/06/17  Yes Short, Noah Delaine, MD  pantoprazole (PROTONIX) 40 MG tablet Take 40 mg by mouth daily.   Yes [provider]  polyethylene glycol (MIRALAX / GLYCOLAX) packet Take 17 g by mouth daily.   Yes [provider]  potassium chloride (MICRO-K) 10 MEQ CR capsule Take 1 capsule (10 mEq total) by mouth daily. 02/18/17   Yes Arnoldo Morale, MD  QUEtiapine (SEROQUEL) 50 MG tablet Take 1 tablet (50 mg total) by mouth 2 (two) times daily. 03/03/17  Yes Rosita Fire, MD  traMADol (ULTRAM) 50 MG tablet Take one tablet by mouth 4 times daily as needed for pain. 04/15/17  Yes Reed, Tiffany L, DO  traZODone (DESYREL) 100 MG tablet Take 200 mg by mouth at bedtime.    Yes [provider]  loratadine (CLARITIN) 10 MG tablet Take 1 tablet (10 mg total) by mouth daily. 04/21/17   Argentina Donovan, PA-C     Objective:  EXAM:   Vitals:   04/21/17 0837  BP: 115/81  Pulse: 82  Temp: 97.7 F (36.5 C)  TempSrc: Oral  SpO2: 100%  Weight: 127 lb 3.2 oz (57.7 kg)    General appearance : A&OX3. NAD. Non-toxic-appearing, thin and ataxic gait(she is getting a walker today).  No one is with her.  Nurses assist her throughout visit. HEENT: Atraumatic and Normocephalic.  PERRLA. EOM intact.  Turbinates pale and boggy Neck: supple, no JVD. No cervical lymphadenopathy. No thyromegaly Chest/Lungs:  Breathing-non-labored, Good air entry bilaterally, breath sounds normal without rales, rhonchi, or wheezing  CVS: S1 S2 regular, no murmurs, gallops, rubs  Extremities: Bilateral Lower Ext shows no edema, both legs are warm to touch with = pulse throughout Neurology:  CN II-XII grossly intact, Non focal.   Psych:  Appropriate eye contact.  Forgetful.  Thoughts a little scattered but overall cohesive. Skin:  No Rash  Data Review Lab Results  Component Value Date   HGBA1C 5.60 02/28/2015     Assessment & Plan    1. Seasonal allergic rhinitis, unspecified trigger Stop zyrtec.  Continue Flonase - loratadine (CLARITIN) 10 MG tablet; Take 1 tablet (10 mg total) by mouth daily.  Dispense: 30 tablet; Refill: 11  2. Brain metastasis ([place]) Stable and in remission  3. Confusion No new changes-f/up with Dr Vevelyn Pat today  4. Gait abnormality No changes  I have encourage her to go to the ED if she develops any acute  changes or new memory loss/cognition  issues.    Patient have been counseled extensively about nutrition and exercise  Return in about 6 weeks (around 06/02/2017) for Dr Jarold Song; multiple issues.  The patient was given clear instructions to go to ER or return to medical center if symptoms don't improve, worsen or new problems develop. The patient verbalized understanding. The patient was told to call to get lab results if they haven't heard anything in the next week.     Freeman Caldron, PA-C [facility] [place], [place]   04/21/2017, 9:07 AM

## 2017-04-23 NOTE — Telephone Encounter (Signed)
Left patient a message asking her to give [place] a call back in regards the medication request that she placed on 5/23 and her provider's answer.    Thank you.

## 2017-04-23 NOTE — Telephone Encounter (Signed)
She will have to obtain that from Psychiatry as we had made an appointment for her for 02/22/17 at her last visit with me.

## 2017-04-25 ENCOUNTER — Encounter (HOSPITAL_COMMUNITY): Payer: Self-pay | Admitting: *Deleted

## 2017-04-25 ENCOUNTER — Inpatient Hospital Stay (HOSPITAL_COMMUNITY)
Admission: EM | Admit: 2017-04-25 | Discharge: 2017-04-30 | DRG: 885 | Disposition: A | Discharge status: Home Health | Payer: Medicaid Other | Attending: Internal Medicine | Admitting: Internal Medicine

## 2017-04-25 ENCOUNTER — Encounter (HOSPITAL_COMMUNITY): Payer: Self-pay

## 2017-04-25 ENCOUNTER — Emergency Department (HOSPITAL_COMMUNITY)
Admission: EM | Admit: 2017-04-25 | Discharge: 2017-04-25 | Discharge status: Against Medical Advice | Payer: Medicaid Other | Attending: Emergency Medicine | Admitting: Emergency Medicine

## 2017-04-25 DIAGNOSIS — R251 Tremor, unspecified: Secondary | ICD-10-CM

## 2017-04-25 DIAGNOSIS — K219 Gastro-esophageal reflux disease without esophagitis: Secondary | ICD-10-CM | POA: Diagnosis present

## 2017-04-25 DIAGNOSIS — J302 Other seasonal allergic rhinitis: Secondary | ICD-10-CM | POA: Diagnosis present

## 2017-04-25 DIAGNOSIS — R799 Abnormal finding of blood chemistry, unspecified: Secondary | ICD-10-CM

## 2017-04-25 DIAGNOSIS — C50312 Malignant neoplasm of lower-inner quadrant of left female breast: Secondary | ICD-10-CM | POA: Diagnosis present

## 2017-04-25 DIAGNOSIS — F341 Dysthymic disorder: Secondary | ICD-10-CM | POA: Insufficient documentation

## 2017-04-25 DIAGNOSIS — F17210 Nicotine dependence, cigarettes, uncomplicated: Secondary | ICD-10-CM | POA: Diagnosis present

## 2017-04-25 DIAGNOSIS — Z79899 Other long term (current) drug therapy: Secondary | ICD-10-CM

## 2017-04-25 DIAGNOSIS — T56891A Toxic effect of other metals, accidental (unintentional), initial encounter: Secondary | ICD-10-CM | POA: Diagnosis present

## 2017-04-25 DIAGNOSIS — R269 Unspecified abnormalities of gait and mobility: Secondary | ICD-10-CM

## 2017-04-25 DIAGNOSIS — D509 Iron deficiency anemia, unspecified: Secondary | ICD-10-CM | POA: Diagnosis present

## 2017-04-25 DIAGNOSIS — F3130 Bipolar disorder, current episode depressed, mild or moderate severity, unspecified: Secondary | ICD-10-CM

## 2017-04-25 DIAGNOSIS — Z853 Personal history of malignant neoplasm of breast: Secondary | ICD-10-CM | POA: Insufficient documentation

## 2017-04-25 DIAGNOSIS — R7889 Finding of other specified substances, not normally found in blood: Secondary | ICD-10-CM | POA: Diagnosis not present

## 2017-04-25 DIAGNOSIS — R7989 Other specified abnormal findings of blood chemistry: Secondary | ICD-10-CM

## 2017-04-25 DIAGNOSIS — Z9012 Acquired absence of left breast and nipple: Secondary | ICD-10-CM

## 2017-04-25 DIAGNOSIS — F319 Bipolar disorder, unspecified: Principal | ICD-10-CM | POA: Diagnosis present

## 2017-04-25 DIAGNOSIS — C50919 Malignant neoplasm of unspecified site of unspecified female breast: Secondary | ICD-10-CM

## 2017-04-25 DIAGNOSIS — F1011 Alcohol abuse, in remission: Secondary | ICD-10-CM | POA: Diagnosis present

## 2017-04-25 DIAGNOSIS — T50991A Poisoning by other drugs, medicaments and biological substances, accidental (unintentional), initial encounter: Secondary | ICD-10-CM | POA: Diagnosis present

## 2017-04-25 DIAGNOSIS — C7931 Secondary malignant neoplasm of brain: Secondary | ICD-10-CM | POA: Diagnosis present

## 2017-04-25 DIAGNOSIS — Z79811 Long term (current) use of aromatase inhibitors: Secondary | ICD-10-CM

## 2017-04-25 DIAGNOSIS — Z888 Allergy status to other drugs, medicaments and biological substances status: Secondary | ICD-10-CM

## 2017-04-25 LAB — COMPREHENSIVE METABOLIC PANEL
ALT: 12 U/L — ABNORMAL LOW (ref 14–54)
ALT: 8 U/L — ABNORMAL LOW (ref 14–54)
AST: 15 U/L (ref 15–41)
AST: 13 U/L — ABNORMAL LOW (ref 15–41)
Albumin: 4.4 g/dL (ref 3.5–5.0)
Albumin: 4.3 g/dL (ref 3.5–5.0)
Alkaline Phosphatase: 47 U/L (ref 38–126)
Alkaline Phosphatase: 48 U/L (ref 38–126)
Anion gap: 8 (ref 5–15)
Anion gap: 7 (ref 5–15)
BUN: 13 mg/dL (ref 6–20)
BUN: 12 mg/dL (ref 6–20)
CO2: 24 mmol/L (ref 22–32)
CO2: 26 mmol/L (ref 22–32)
Calcium: 9.6 mg/dL (ref 8.9–10.3)
Calcium: 9.6 mg/dL (ref 8.9–10.3)
Chloride: 104 mmol/L (ref 101–111)
Chloride: 107 mmol/L (ref 101–111)
Creatinine, Ser: 0.9 mg/dL (ref 0.44–1.00)
Creatinine, Ser: 0.9 mg/dL (ref 0.44–1.00)
GFR calc Af Amer: >60 mL/min (ref >60)
GFR calc Af Amer: >60 mL/min (ref >60)
GFR calc non Af Amer: >60 mL/min (ref >60)
GFR calc non Af Amer: >60 mL/min (ref >60)
Glucose, Bld: 80 mg/dL (ref 65–99)
Glucose, Bld: 86 mg/dL (ref 65–99)
Potassium: 4.5 mmol/L (ref 3.5–5.1)
Potassium: 3.7 mmol/L (ref 3.5–5.1)
Sodium: 136 mmol/L (ref 135–145)
Sodium: 140 mmol/L (ref 135–145)
Total Bilirubin: 0.3 mg/dL (ref 0.3–1.2)
Total Bilirubin: 0.4 mg/dL (ref 0.3–1.2)
Total Protein: 6.9 g/dL (ref 6.5–8.1)
Total Protein: 6.7 g/dL (ref 6.5–8.1)

## 2017-04-25 LAB — CBC WITH DIFFERENTIAL/PLATELET
Basophils Absolute: 0 10*3/uL (ref 0.0–0.1)
Basophils Absolute: 0 10*3/uL (ref 0.0–0.1)
Basophils Relative: 1 %
Basophils Relative: 1 %
Eosinophils Absolute: 0.1 10*3/uL (ref 0.0–0.7)
Eosinophils Absolute: 0.1 10*3/uL (ref 0.0–0.7)
Eosinophils Relative: 2 %
Eosinophils Relative: 2 %
HCT: 42 % (ref 36.0–46.0)
HCT: 35.5 % — ABNORMAL LOW (ref 36.0–46.0)
Hemoglobin: 13.6 g/dL (ref 12.0–15.0)
Hemoglobin: 11.5 g/dL — ABNORMAL LOW (ref 12.0–15.0)
Lymphocytes Relative: 18 %
Lymphocytes Relative: 17 %
Lymphs Abs: 0.8 10*3/uL (ref 0.7–4.0)
Lymphs Abs: 1 10*3/uL (ref 0.7–4.0)
MCH: 31.1 pg (ref 26.0–34.0)
MCH: 30.8 pg (ref 26.0–34.0)
MCHC: 32.4 g/dL (ref 30.0–36.0)
MCHC: 32.4 g/dL (ref 30.0–36.0)
MCV: 96.1 fL (ref 78.0–100.0)
MCV: 95.2 fL (ref 78.0–100.0)
Monocytes Absolute: 0.2 10*3/uL (ref 0.1–1.0)
Monocytes Absolute: 0.4 10*3/uL (ref 0.1–1.0)
Monocytes Relative: 6 %
Monocytes Relative: 7 %
Neutro Abs: 3.2 10*3/uL (ref 1.7–7.7)
Neutro Abs: 4.2 10*3/uL (ref 1.7–7.7)
Neutrophils Relative %: 75 %
Neutrophils Relative %: 73 %
Platelets: 174 10*3/uL (ref 150–400)
Platelets: 197 10*3/uL (ref 150–400)
RBC: 4.37 MIL/uL (ref 3.87–5.11)
RBC: 3.73 MIL/uL — ABNORMAL LOW (ref 3.87–5.11)
RDW: 12.7 % (ref 11.5–15.5)
RDW: 12.7 % (ref 11.5–15.5)
WBC: 4.3 10*3/uL (ref 4.0–10.5)
WBC: 5.8 10*3/uL (ref 4.0–10.5)

## 2017-04-25 LAB — LITHIUM LEVEL: Lithium Lvl: 1.66 mmol/L (ref 0.60–1.20)

## 2017-04-25 LAB — URINALYSIS, ROUTINE W REFLEX MICROSCOPIC
Bacteria, UA: NONE SEEN
Bilirubin Urine: NEGATIVE
Glucose, UA: NEGATIVE mg/dL
Ketones, ur: 20 mg/dL — AB
Nitrite: NEGATIVE
Protein, ur: NEGATIVE mg/dL
Specific Gravity, Urine: 1.012 (ref 1.005–1.030)
pH: 5 (ref 5.0–8.0)

## 2017-04-25 LAB — RAPID URINE DRUG SCREEN, HOSP PERFORMED
Amphetamines: NOT DETECTED
Barbiturates: NOT DETECTED
Benzodiazepines: POSITIVE — AB
Cocaine: NOT DETECTED
Opiates: NOT DETECTED
Tetrahydrocannabinol: NOT DETECTED

## 2017-04-25 LAB — ETHANOL: Alcohol, Ethyl (B): <5 mg/dL (ref <5)

## 2017-04-25 MED ORDER — SODIUM CHLORIDE 0.9 % IV BOLUS (SEPSIS): 1000.0000 mL | Freq: Once | INTRAVENOUS | Status: DC

## 2017-04-25 MED ORDER — LORAZEPAM 0.5 MG PO TABS
0.5000 mg | ORAL_TABLET | Freq: Once | ORAL | Status: AC
Start: 2017-04-25 — End: 2017-04-25
  Administered 2017-04-25: 0.5 mg via ORAL
  Filled 2017-04-25: qty 1

## 2017-04-25 MED ORDER — SODIUM CHLORIDE 0.9 % IV BOLUS (SEPSIS)
1000.0000 mL | Freq: Once | INTRAVENOUS | Status: AC
  Administered 2017-04-25: 1000 mL via INTRAVENOUS

## 2017-04-25 MED ORDER — SODIUM CHLORIDE 0.9 % IV SOLN
Freq: Once | INTRAVENOUS | Status: AC
  Administered 2017-04-25: 12:00:00 via INTRAVENOUS

## 2017-04-25 NOTE — H&P (Signed)
[name] is a 56 year old female with past medical history relevant for medical history  remote alcohol abuse, bipolar disorder, cancer of the left breast with brain metastasis status post left mastectomy and craniectomy with radiation to the brain and breast who presents to the ED with anxiety and emotional concerns. I was called to admit patient due to borderline elevation of lithium levels (1.6). Patient has a history of bipolar disorder was recently restarted on lithium about 10 days ago by her psychiatrist, pill count  reveals the patient has been taking more lithium tablets than prescribed over the last 10 days. Patient had taken 27 tablets of the 300 mg lithium pills in 10 days, it should have been 20 tablets (1 tab bid)  Patient denies GI symptoms, denies any new neurological symptoms. No evidence of lithium toxicity clinically.  Renal function appears to be adequate, plan was to hydrate patient IV and then recheck lithium levels.   However patient insisted on leaving the ED AMA, she refused to be admitted.  She actually became kind of aggressive with nursing staff, security was called.  Despite persuasion to stay for IV fluids and recheck of lithium levels patient left AGAINST MEDICAL ADVICE  She refused admission, refused IV fluids, refused further exam and evaluation  Latamara Melder, MD

## 2017-04-25 NOTE — ED Notes (Signed)
CRITICAL VALUE ALERT  Critical Value:  Lithium 1.66 Date & Time Notied:  04/25/17, 1145  Provider Notified: yes  Orders Received/Actions taken: Notified Jeannett Senior, PA

## 2017-04-25 NOTE — ED Provider Notes (Signed)
[facility] DEPT Provider Note   CSN: [id number] Arrival date & time: 04/25/17  1818     History   Chief Complaint Chief Complaint  Patient presents with  . Weakness    HPI [name] is a 56 y.o. female.  HPI Patient presents to the emergency room for tremulousness. Patient was admitted to the hospital last month. She was having issues with confusion.  She had MRI and CT scans of the brain that did not show any recurrent brain metastases. She ended up getting treatment for urinary tract infection. She was evaluated by psychiatry and they felt that most of her issues were related to her psychiatric conditions.  Patient was then admitted to a nursing home facility. Patient was restarted on lithium when she was released from the nursing facility on May 17. Patient ended up going to [facility] emergency room today because of increasing weakness and tremors. Patient states she is falling. She also has been emotionally labile.  At [facility] emergency room the patient was noted to have an elevated lithium level I.66. Plan was for her to be admitted to the hospital.  Patient ended up leaving [place]. This occurred sometime in the afternoon. She then for some reason drove dowwn here to [facility]. Past Medical History:  Diagnosis Date  . Alcohol abuse   . Anemia    during chemo  . Anxiety    At age 11  . Arthritis Dx 2010  . Bipolar disorder ([place])   . Cancer ([place])    breast mets to brain  . Chronic pain   . Complication of anesthesia   . Depression   . Fibromyalgia Dx 2005  . GERD (gastroesophageal reflux disease)   . Headache    hx  migraines  . Opiate dependence ([place])   . PONV (postoperative nausea and vomiting)   . Port-a-cath in place   . PTSD (post-traumatic stress disorder)     Patient Active Problem List   Diagnosis Date Noted  . Acute encephalopathy 03/16/2017  . Acute lower UTI   . Hypokalemia 02/18/2017  . Gait abnormality 01/06/2017    . Bipolar I disorder, most recent episode depressed ([place]) 12/08/2016  . Adjustment disorder with anxiety 12/06/2016  . Delirium due to another medical condition 12/03/2016  . Confusion 12/03/2016  . Diarrhea 12/03/2016  . Metastatic breast cancer ([place])   . Cerumen impaction 11/25/2016  . Otitis media 11/06/2016  . Brain metastasis ([place]) 07/27/2016  . Iron deficiency anemia 06/26/2016  . Bone metastases ([place]) 06/03/2016  . Primary cancer of lower-inner quadrant of left female breast ([place]) 06/01/2016  . Pap smear for cervical cancer screening 03/28/2015  . Current smoker 03/28/2015  . Healthcare maintenance 03/28/2015  . Seasonal allergies 03/28/2015  . Anxiety state 02/28/2015  . Fibromyalgia 02/28/2015  . Family history of diabetes mellitus 02/28/2015  . H/O alcohol abuse     Past Surgical History:  Procedure Laterality Date  . APPLICATION OF CRANIAL NAVIGATION N/A 08/14/2016   Procedure: APPLICATION OF CRANIAL NAVIGATION;  Surgeon: Erline Levine, MD;  Location: [facility] NEURO ORS;  Service: Neurosurgery;  Laterality: N/A;  . BREAST RECONSTRUCTION Left    with silicone implant  . CRANIOTOMY N/A 08/14/2016   Procedure: CRANIOTOMY TUMOR EXCISION WITH Lucky Rathke;  Surgeon: Erline Levine, MD;  Location: [facility] NEURO ORS;  Service: Neurosurgery;  Laterality: N/A;  . FIBULA FRACTURE SURGERY Left   . MASTECTOMY Left   . RADIOLOGY WITH ANESTHESIA N/A 07/23/2016   Procedure:  MRI OF BRAIN WITH AND WITHOUT;  Surgeon: Medication Radiologist, MD;  Location: [facility];  Service: Radiology;  Laterality: N/A;  . RADIOLOGY WITH ANESTHESIA N/A 09/08/2016   Procedure: MRI OF BRAIN WITH AND WITHOUT CONTRAST;  Surgeon: Medication Radiologist, MD;  Location: [facility];  Service: Radiology;  Laterality: N/A;  . RADIOLOGY WITH ANESTHESIA N/A 12/10/2016   Procedure: MRI OF BRAIN WITH AND WITHOUT;  Surgeon: Medication Radiologist, MD;  Location: [facility];  Service: Radiology;  Laterality: N/A;  . RADIOLOGY WITH ANESTHESIA N/A  03/02/2017   Procedure: MRI of BRAIN W and W/OUT CONTRAST;  Surgeon: Medication Radiologist, MD;  Location: [facility];  Service: Radiology;  Laterality: N/A;  . right power port placement Right     OB History    No data available       Home Medications    Prior to Admission medications   Medication Sig Start Date End Date Taking? Authorizing Provider  anastrozole (ARIMIDEX) 1 MG tablet Take 1 tablet (1 mg total) by mouth daily. 08/25/16   Magrinat, Virgie Dad, MD  Biotin 1 MG CAPS Take 1 mg by mouth daily.    [provider]  cholecalciferol (VITAMIN D) 1000 units tablet Take 1,000 Units by mouth daily.    [provider]  clonazePAM (KLONOPIN) 0.5 MG tablet Take 1 tablet (0.5 mg total) by mouth daily as needed for anxiety. 04/15/17   Lauree Chandler, NP  gabapentin (NEURONTIN) 300 MG capsule Take 1 capsule (300 mg total) by mouth 2 (two) times daily. 04/06/17   Janece Canterbury, MD  ibuprofen (ADVIL,MOTRIN) 800 MG tablet Take 800 mg by mouth 3 (three) times daily.    [provider]  lamoTRIgine (LAMICTAL) 200 MG tablet Take 1 tablet (200 mg total) by mouth daily. 04/07/17   Janece Canterbury, MD  lithium 300 MG tablet Take 300 mg by mouth 2 (two) times daily.    [provider]  loratadine (CLARITIN) 10 MG tablet Take 1 tablet (10 mg total) by mouth daily. 04/21/17   Argentina Donovan, PA-C  nicotine (NICODERM CQ - DOSED IN MG/24 HR) 7 mg/24hr patch Place 7 mg onto the skin daily.    [provider]  Oxcarbazepine (TRILEPTAL) 300 MG tablet Take 1 tablet (300 mg total) by mouth 2 (two) times daily. 04/06/17   Janece Canterbury, MD  pantoprazole (PROTONIX) 40 MG tablet Take 40 mg by mouth daily.    [provider]  potassium chloride (MICRO-K) 10 MEQ CR capsule Take 1 capsule (10 mEq total) by mouth daily. 02/18/17   Arnoldo Morale, MD  QUEtiapine (SEROQUEL) 50 MG tablet Take 1 tablet (50 mg total) by mouth 2 (two) times daily. 03/03/17   Rosita Fire, MD  traMADol (ULTRAM) 50 MG tablet Take one tablet by mouth 4 times daily as needed for pain. 04/15/17   Reed, Tiffany L, DO  traZODone (DESYREL) 100 MG tablet Take 200 mg by mouth at bedtime.     [provider]    Family History Family History  Problem Relation Age of Onset  . Diabetes Mother   . Bipolar disorder Mother   . CAD Father     Social History Social History  Substance Use Topics  . Smoking status: Current Every Day Smoker    Packs/day: 0.25    Types: Cigarettes  . Smokeless tobacco: Never Used     Comment: Pt is on Chantix at present time  . Alcohol use No  Comment: no ETOH since 08/22/12     Allergies   Demerol and Erythromycin   Review of Systems Review of Systems  All other systems reviewed and are negative.    Physical Exam Updated Vital Signs BP 114/80 (BP Location: Right Arm)   Pulse 83   Temp 98.7 F (37.1 C) (Oral)   Resp 16   Ht 1.651 m (5\' 5" )   Wt 57.6 kg (127 lb)   SpO2 100%   BMI 21.13 kg/m   Physical Exam  Constitutional: No distress.  HENT:  Head: Normocephalic and atraumatic.  Right Ear: External ear normal.  Left Ear: External ear normal.  Eyes: Conjunctivae are normal. Right eye exhibits no discharge. Left eye exhibits no discharge. No scleral icterus.  Neck: Neck supple. No tracheal deviation present.  Cardiovascular: Normal rate, regular rhythm and intact distal pulses.   Pulmonary/Chest: Effort normal and breath sounds normal. No stridor. No respiratory distress. She has no wheezes. She has no rales.  Abdominal: Soft. Bowel sounds are normal. She exhibits no distension. There is no tenderness. There is no rebound and no guarding.  Musculoskeletal: She exhibits no edema or tenderness.  Neurological: She is alert. She displays tremor. No cranial nerve deficit (no facial droop, extraocular movements intact, no slurred speech) or sensory deficit. She exhibits normal muscle tone. She displays no seizure  activity. Coordination normal.  Generalized weakness  Skin: Skin is warm and dry. No rash noted.  Psychiatric: Her affect is labile. She is withdrawn. She expresses impulsivity. She exhibits a depressed mood.  Patient is emotionally labile, she will change from being aggressive and agitated to tearful and remorseful  Nursing note and vitals reviewed.    ED Treatments / Results  Labs (all labs ordered are listed, but only abnormal results are displayed) Labs Reviewed  CBC WITH DIFFERENTIAL/PLATELET - Abnormal; Notable for the following:       Result Value   RBC 3.73 (*)    Hemoglobin 11.5 (*)    HCT 35.5 (*)    All other components within normal limits  COMPREHENSIVE METABOLIC PANEL - Abnormal; Notable for the following:    AST 13 (*)    ALT 8 (*)    All other components within normal limits  URINALYSIS, ROUTINE W REFLEX MICROSCOPIC - Abnormal; Notable for the following:    Hgb urine dipstick SMALL (*)    Ketones, ur 20 (*)    Leukocytes, UA MODERATE (*)    Squamous Epithelial / LPF 0-5 (*)    All other components within normal limits  RAPID URINE DRUG SCREEN, HOSP PERFORMED - Abnormal; Notable for the following:    Benzodiazepines POSITIVE (*)    All other components within normal limits  ETHANOL  LITHIUM LEVEL    Procedures Procedures (including critical care time)  Medications Ordered in ED Medications  sodium chloride 0.9 % bolus 1,000 mL (1,000 mLs Intravenous New Bag/Given 04/25/17 2130)     Initial Impression / Assessment and Plan / ED Course  I have reviewed the triage vital signs and the nursing notes.  Pertinent labs & imaging results that were available during my care of the patient were reviewed by me and considered in my medical decision making (see chart for details).   patient was just at [facility].  the plan was to admit her to the hospital for overnight observation and hydration because of an elevated lithium level. Appears the patient has  not been taking her medications as prescribed. For some  reason the patient decided to leave AMA and then drive down to this hospital.  Patient is tremulous here in the emergency room. She does appear to be emotionally labile. I think ultimately she will benefit from psychiatry evaluation. She is not medically stable at this point because of her elevated lithium level. I will consult the medical service.  Final Clinical Impressions(s) / ED Diagnoses   Final diagnoses:  Elevated lithium level  Tremor      Dorie Rank, MD 04/25/17 2352

## 2017-04-25 NOTE — ED Notes (Signed)
Security called for patient.

## 2017-04-25 NOTE — ED Provider Notes (Signed)
[facility] DEPT Provider Note   CSN: [id number] Arrival date & time: 04/25/17  1856     History   Chief Complaint Chief Complaint  Patient presents with  . Shaking    HPI [name] is a 56 y.o. female.  HPI [name] is a 56 y.o. female with history of alcohol abuse, now in remission, anxiety, bipolar disorder, history of breast cancer with metastases to the brain now in remission, fibromyalgia, presents to emergency department complaining of tremor. Patient with recent admission for altered mental status and encephalopathy, was thought to be possibly related to medications and psychiatric illness. During admission there was mention the patient has tremors. They seem to be improved once they took her off of lithium. Patient seems to believe that her lithium was restarted and she states she is still taking it. She did have some changes in her psychiatric medications while in the hospital and it is unclear which medication she is taking based on her history. Patient reports that she is very anxious at home. She reports that she feels lonely and wants to be with her family but states that her family has abandoned her. She is tearful through the whole history and exam, stating she is "tired and just want to be normal." Patient otherwise denies any pain, no nausea vomiting, no URI symptoms, no urinary symptoms. Taking all of her medications that she believes she is supposed to be taking with good compliance.  Past Medical History:  Diagnosis Date  . Alcohol abuse   . Anemia    during chemo  . Anxiety    At age 24  . Arthritis Dx 2010  . Bipolar disorder ([place])   . Cancer ([place])    breast mets to brain  . Chronic pain   . Complication of anesthesia   . Depression   . Fibromyalgia Dx 2005  . GERD (gastroesophageal reflux disease)   . Headache    hx  migraines  . Opiate dependence ([place])   . PONV (postoperative nausea and vomiting)   . Port-a-cath in place   . PTSD  (post-traumatic stress disorder)     Patient Active Problem List   Diagnosis Date Noted  . Acute encephalopathy 03/16/2017  . Acute lower UTI   . Hypokalemia 02/18/2017  . Gait abnormality 01/06/2017  . Bipolar I disorder, most recent episode depressed ([place]) 12/08/2016  . Adjustment disorder with anxiety 12/06/2016  . Delirium due to another medical condition 12/03/2016  . Confusion 12/03/2016  . Diarrhea 12/03/2016  . Metastatic breast cancer ([place])   . Cerumen impaction 11/25/2016  . Otitis media 11/06/2016  . Brain metastasis ([place]) 07/27/2016  . Iron deficiency anemia 06/26/2016  . Bone metastases ([place]) 06/03/2016  . Primary cancer of lower-inner quadrant of left female breast ([place]) 06/01/2016  . Pap smear for cervical cancer screening 03/28/2015  . Current smoker 03/28/2015  . Healthcare maintenance 03/28/2015  . Seasonal allergies 03/28/2015  . Anxiety state 02/28/2015  . Fibromyalgia 02/28/2015  . Family history of diabetes mellitus 02/28/2015  . H/O alcohol abuse     Past Surgical History:  Procedure Laterality Date  . APPLICATION OF CRANIAL NAVIGATION N/A 08/14/2016   Procedure: APPLICATION OF CRANIAL NAVIGATION;  Surgeon: Erline Levine, MD;  Location: [facility];  Service: Neurosurgery;  Laterality: N/A;  . BREAST RECONSTRUCTION Left    with silicone implant  . CRANIOTOMY N/A 08/14/2016   Procedure: CRANIOTOMY TUMOR EXCISION WITH Lucky Rathke;  Surgeon: Erline Levine, MD;  Location: [facility]  ORS;  Service: Neurosurgery;  Laterality: N/A;  . FIBULA FRACTURE SURGERY Left   . MASTECTOMY Left   . RADIOLOGY WITH ANESTHESIA N/A 07/23/2016   Procedure: MRI OF BRAIN WITH AND WITHOUT;  Surgeon: Medication Radiologist, MD;  Location: [facility];  Service: Radiology;  Laterality: N/A;  . RADIOLOGY WITH ANESTHESIA N/A 09/08/2016   Procedure: MRI OF BRAIN WITH AND WITHOUT CONTRAST;  Surgeon: Medication Radiologist, MD;  Location: [facility];  Service: Radiology;  Laterality: N/A;  . RADIOLOGY  WITH ANESTHESIA N/A 12/10/2016   Procedure: MRI OF BRAIN WITH AND WITHOUT;  Surgeon: Medication Radiologist, MD;  Location: [facility];  Service: Radiology;  Laterality: N/A;  . RADIOLOGY WITH ANESTHESIA N/A 03/02/2017   Procedure: MRI of BRAIN W and W/OUT CONTRAST;  Surgeon: Medication Radiologist, MD;  Location: [facility];  Service: Radiology;  Laterality: N/A;  . right power port placement Right     OB History    No data available       Home Medications    Prior to Admission medications   Medication Sig Start Date End Date Taking? Authorizing Provider  anastrozole (ARIMIDEX) 1 MG tablet Take 1 tablet (1 mg total) by mouth daily. 08/25/16   Magrinat, Virgie Dad, MD  Biotin 1 MG CAPS Take 1 mg by mouth daily.    [provider]  cholecalciferol (VITAMIN D) 1000 units tablet Take 1,000 Units by mouth daily.    [provider]  clonazePAM (KLONOPIN) 0.5 MG tablet Take 1 tablet (0.5 mg total) by mouth daily as needed for anxiety. 04/15/17   Lauree Chandler, NP  diphenhydrAMINE (BENADRYL) 25 MG tablet Take 25 mg by mouth every 6 (six) hours as needed for allergies.    [provider]  gabapentin (NEURONTIN) 300 MG capsule Take 1 capsule (300 mg total) by mouth 2 (two) times daily. 04/06/17   Janece Canterbury, MD  ibuprofen (ADVIL,MOTRIN) 800 MG tablet Take 800 mg by mouth 3 (three) times daily.    [provider]  lamoTRIgine (LAMICTAL) 200 MG tablet Take 1 tablet (200 mg total) by mouth daily. 04/07/17   Janece Canterbury, MD  lithium 300 MG tablet Take 300 mg by mouth 2 (two) times daily.    [provider]  loratadine (CLARITIN) 10 MG tablet Take 1 tablet (10 mg total) by mouth daily. 04/21/17   Argentina Donovan, PA-C  nicotine (NICODERM CQ - DOSED IN MG/24 HR) 7 mg/24hr patch Place 7 mg onto the skin daily.    [provider]  Oxcarbazepine (TRILEPTAL) 300 MG tablet Take 1 tablet (300 mg total) by mouth 2 (two) times daily. 04/06/17   Janece Canterbury,  MD  pantoprazole (PROTONIX) 40 MG tablet Take 40 mg by mouth daily.    [provider]  polyethylene glycol (MIRALAX / GLYCOLAX) packet Take 17 g by mouth daily.    [provider]  potassium chloride (MICRO-K) 10 MEQ CR capsule Take 1 capsule (10 mEq total) by mouth daily. 02/18/17   Arnoldo Morale, MD  QUEtiapine (SEROQUEL) 50 MG tablet Take 1 tablet (50 mg total) by mouth 2 (two) times daily. 03/03/17   Rosita Fire, MD  traMADol (ULTRAM) 50 MG tablet Take one tablet by mouth 4 times daily as needed for pain. 04/15/17   Reed, Tiffany L, DO  traZODone (DESYREL) 100 MG tablet Take 200 mg by mouth at bedtime.     [provider]    Family History Family History  Problem Relation Age of  Onset  . Diabetes Mother   . Bipolar disorder Mother   . CAD Father     Social History Social History  Substance Use Topics  . Smoking status: Current Every Day Smoker    Packs/day: 0.25    Types: Cigarettes  . Smokeless tobacco: Never Used     Comment: Pt is on Chantix at present time  . Alcohol use No     Comment: no ETOH since 08/22/12     Allergies   Demerol and Erythromycin   Review of Systems Review of Systems  Constitutional: Negative for chills and fever.  Respiratory: Negative for cough, chest tightness and shortness of breath.   Cardiovascular: Negative for chest pain, palpitations and leg swelling.  Gastrointestinal: Negative for abdominal pain, diarrhea, nausea and vomiting.  Genitourinary: Negative for dysuria, flank pain and pelvic pain.  Musculoskeletal: Negative for arthralgias, myalgias, neck pain and neck stiffness.  Skin: Negative for rash.  Neurological: Positive for tremors. Negative for dizziness, weakness, numbness and headaches.  Psychiatric/Behavioral: Positive for dysphoric mood. The patient is nervous/anxious.   All other systems reviewed and are negative.    Physical Exam Updated Vital Signs BP 107/69 (BP Location: Right Arm)    Pulse 85   Temp 98 F (36.7 C) (Oral)   Resp 18   Ht 5\' 5"  (1.651 m)   Wt 57.6 kg (127 lb)   SpO2 95%   BMI 21.13 kg/m   Physical Exam  Constitutional: She is oriented to person, place, and time. She appears well-developed and well-nourished. No distress.  HENT:  Head: Normocephalic.  Eyes: Conjunctivae are normal.  Neck: Neck supple.  Cardiovascular: Normal rate, regular rhythm and normal heart sounds.   Pulmonary/Chest: Effort normal and breath sounds normal. No respiratory distress. She has no wheezes. She has no rales.  Abdominal: Soft. Bowel sounds are normal. She exhibits no distension. There is no tenderness. There is no rebound.  Musculoskeletal: She exhibits no edema.  Neurological: She is alert and oriented to person, place, and time.  Skin: Skin is warm and dry.  Psychiatric: Her mood appears anxious. Her affect is labile.  tearful  Nursing note and vitals reviewed.    ED Treatments / Results  Labs (all labs ordered are listed, but only abnormal results are displayed) Labs Reviewed  COMPREHENSIVE METABOLIC PANEL - Abnormal; Notable for the following:       Result Value   ALT 12 (*)    All other components within normal limits  LITHIUM LEVEL - Abnormal; Notable for the following:    Lithium Lvl 1.66 (*)    All other components within normal limits  CBC WITH DIFFERENTIAL/PLATELET  URINALYSIS, ROUTINE W REFLEX MICROSCOPIC  RAPID URINE DRUG SCREEN, HOSP PERFORMED    EKG  EKG Interpretation None       Radiology No results found.  Procedures Procedures (including critical care time)  Medications Ordered in ED Medications  LORazepam (ATIVAN) tablet 0.5 mg (0.5 mg Oral Given 04/25/17 1030)     Initial Impression / Assessment and Plan / ED Course  I have reviewed the triage vital signs and the nursing notes.  Pertinent labs & imaging results that were available during my care of the patient were reviewed by me and considered in my medical decision  making (see chart for details).     Patient in emergency department with complaint of tremor and anxiety. We will check labs, urinalysis, will treat with Ativan. Will reassess. She has no pain, no other complaints.  12:15 PM Patient's lithium level elevated at 1.66. I did review the chart and patient was restarted on lithium on 04/15/17 by her request when she was discharged from the nursing home facility. Renal function normal. IV fluids and EKG ordered. Will admit for observation and further treatment.  Spoke with hospitalist, they will hydrate and reassess patient.   Vitals:   04/25/17 0956 04/25/17 1000 04/25/17 1003 04/25/17 1230  BP:   107/69 130/85  Pulse:   85 82  Resp:   18 15  Temp:   98 F (36.7 C)   TempSrc:   Oral   SpO2: 100% 99% 95% 100%  Weight:   57.6 kg (127 lb)   Height:   5\' 5"  (1.651 m)    Pt apparently refused observation and was let go.    Final Clinical Impressions(s) / ED Diagnoses   Final diagnoses:  Elevated lithium level  Tremor    New Prescriptions New Prescriptions   No medications on file     Jeannett Senior, PA-C 04/25/17 1423    Jeannett Senior, PA-C 04/25/17 1529    Pattricia Boss, MD 04/26/17 Curly Rim

## 2017-04-25 NOTE — ED Notes (Signed)
Charge RN Patty came up and spoke with pt about having to wait. Charge RN encouraged pt to stay in her wheel chair.

## 2017-04-25 NOTE — ED Notes (Signed)
Patient refusing to receive IV fluids and stay for blood work redraw between 1500-1600 this evening. Hospitalist encouraged patient to stay patient refused.  During this time, patient refused to stay in stretcher, patient got out of bed with MD and writers help, IV tubing disconnected and stated she wanted to see if her family was here. Patient instructed she could not go out of ED with a port-a-cath  In place. No family was in lobby and patient reassured. Patient became angry, yelling and cursing at staff, patient was redirectable. Patient given several opportunities to stay to receive treatment although refused.  PA to bedside to speak with patient as well, patient refused to stay.  [facility] called to take patient home. Patient taken out of department with security in wheelchair. Security made aware not to leave patient unattended while on hospital premises due to fall risk.

## 2017-04-25 NOTE — ED Triage Notes (Signed)
Pt presents with complaint of shaking.  Pt says she was treated with chemotherapy three weeks ago for breast and back cancer.  Pt left this ED 1 week ago AMA.  Pt is poor historian of current symptoms and is becoming increasingly anxietous.

## 2017-04-25 NOTE — ED Notes (Signed)
Patient refusing to allow writer to obtain EKG, states "I havent ate in over 24 hours and Im not eating until you give me food." Writer explained to patient, I can not give patient food until results are complete or I ask the provider. Explained to patient I can get the EKG then go ask the provider and check to see if labs are back, patient agreed.

## 2017-04-25 NOTE — ED Notes (Signed)
Patient was yelling out "please help me". Writer went into room and patient had pushed bedside tray away from bed and trying to get out of bed.  Writer instructed to patient not to get out of stretcher. Patient noted to have side rail down. Patient was yelling and tearful to Probation officer. Patient helped back in bed then both side rails up with instructions to call nurse on call bell if assistance was needed. Patient verbalized instructions. Yellow socks and yellow arm band for fall risk in place.

## 2017-04-25 NOTE — ED Notes (Signed)
Pt's gait is unsteady. She has bilateral arm and leg tremors. Pt. Has flat affect. Is agitated and uncooperative at points. Continues stating "I just need to sleep leave me alone, I will talk to you tomorrow". Informed patient she would need to cooperate and at this time sleeping is not appropriate for [place] to make sure we are providing the best care. The patient stated "yall don't need to care for me, I was here earlier and yall fixed me but you wouldn't let me smoke or do what I needed to do so I left, but I am back now so I can get some sleep" She insisted on putting on her pajamas which she brought with her in a bag. Allowed to put on bottoms but advised she had to put on hospital gown. She was reluctant but later agreed. When asked why she was here she stated "so I can sleep and so you can give me physical therapy because I can't stop shaking and I am weak." Denies pain. She continues to repeat that she is sleep. When asked why she left the hospital earlier because she was being admitted she stated "I wasn't at the hospital earlier I think you have the wrong patient". Patient alertness rechecked she was A&O x4. Asking "why are you asking me these questions I'm not confused or stupid."

## 2017-04-25 NOTE — ED Notes (Signed)
[facility] here for transport. 

## 2017-04-25 NOTE — ED Triage Notes (Addendum)
States leg jumping and jittery x 6 weeks seen at [facility] for same and family doctor for same complains of no pain. Clear speech noted moves all extremities. States she need rehab.

## 2017-04-25 NOTE — ED Notes (Signed)
ED Provider at bedside. 

## 2017-04-25 NOTE — ED Notes (Signed)
EKg given to Dr. Jeanell Sparrow.

## 2017-04-25 NOTE — ED Notes (Signed)
Hospitalist at bedside 

## 2017-04-25 NOTE — ED Notes (Signed)
Pt outside smoking

## 2017-04-25 NOTE — ED Notes (Signed)
Pt states she is leaving. Stated that she came by EMS and should be in a bed. Explained that we had a wait and she does not want to wait

## 2017-04-25 NOTE — ED Notes (Signed)
Patient given lunch tray and warm blanket.

## 2017-04-26 ENCOUNTER — Encounter (HOSPITAL_COMMUNITY): Payer: Self-pay | Admitting: Internal Medicine

## 2017-04-26 DIAGNOSIS — F29 Unspecified psychosis not due to a substance or known physiological condition: Secondary | ICD-10-CM

## 2017-04-26 DIAGNOSIS — Z79899 Other long term (current) drug therapy: Secondary | ICD-10-CM | POA: Diagnosis not present

## 2017-04-26 DIAGNOSIS — T56894A Toxic effect of other metals, undetermined, initial encounter: Secondary | ICD-10-CM | POA: Diagnosis not present

## 2017-04-26 DIAGNOSIS — Z818 Family history of other mental and behavioral disorders: Secondary | ICD-10-CM

## 2017-04-26 DIAGNOSIS — J302 Other seasonal allergic rhinitis: Secondary | ICD-10-CM | POA: Diagnosis present

## 2017-04-26 DIAGNOSIS — F39 Unspecified mood [affective] disorder: Secondary | ICD-10-CM

## 2017-04-26 DIAGNOSIS — T56892A Toxic effect of other metals, intentional self-harm, initial encounter: Secondary | ICD-10-CM

## 2017-04-26 DIAGNOSIS — R410 Disorientation, unspecified: Secondary | ICD-10-CM | POA: Diagnosis not present

## 2017-04-26 DIAGNOSIS — C7931 Secondary malignant neoplasm of brain: Secondary | ICD-10-CM | POA: Diagnosis present

## 2017-04-26 DIAGNOSIS — Z888 Allergy status to other drugs, medicaments and biological substances status: Secondary | ICD-10-CM | POA: Diagnosis not present

## 2017-04-26 DIAGNOSIS — F319 Bipolar disorder, unspecified: Secondary | ICD-10-CM | POA: Diagnosis present

## 2017-04-26 DIAGNOSIS — F419 Anxiety disorder, unspecified: Secondary | ICD-10-CM

## 2017-04-26 DIAGNOSIS — Z9012 Acquired absence of left breast and nipple: Secondary | ICD-10-CM | POA: Diagnosis not present

## 2017-04-26 DIAGNOSIS — D509 Iron deficiency anemia, unspecified: Secondary | ICD-10-CM | POA: Diagnosis present

## 2017-04-26 DIAGNOSIS — R251 Tremor, unspecified: Secondary | ICD-10-CM | POA: Diagnosis present

## 2017-04-26 DIAGNOSIS — C50312 Malignant neoplasm of lower-inner quadrant of left female breast: Secondary | ICD-10-CM | POA: Diagnosis present

## 2017-04-26 DIAGNOSIS — T56891A Toxic effect of other metals, accidental (unintentional), initial encounter: Secondary | ICD-10-CM

## 2017-04-26 DIAGNOSIS — Z79811 Long term (current) use of aromatase inhibitors: Secondary | ICD-10-CM | POA: Diagnosis not present

## 2017-04-26 DIAGNOSIS — C50919 Malignant neoplasm of unspecified site of unspecified female breast: Secondary | ICD-10-CM

## 2017-04-26 DIAGNOSIS — T50991A Poisoning by other drugs, medicaments and biological substances, accidental (unintentional), initial encounter: Secondary | ICD-10-CM | POA: Diagnosis present

## 2017-04-26 DIAGNOSIS — C7940 Secondary malignant neoplasm of unspecified part of nervous system: Secondary | ICD-10-CM | POA: Diagnosis not present

## 2017-04-26 DIAGNOSIS — R799 Abnormal finding of blood chemistry, unspecified: Secondary | ICD-10-CM

## 2017-04-26 DIAGNOSIS — G4700 Insomnia, unspecified: Secondary | ICD-10-CM

## 2017-04-26 DIAGNOSIS — F17210 Nicotine dependence, cigarettes, uncomplicated: Secondary | ICD-10-CM | POA: Diagnosis present

## 2017-04-26 DIAGNOSIS — T1491XA Suicide attempt, initial encounter: Secondary | ICD-10-CM

## 2017-04-26 DIAGNOSIS — K219 Gastro-esophageal reflux disease without esophagitis: Secondary | ICD-10-CM | POA: Diagnosis present

## 2017-04-26 DIAGNOSIS — F1011 Alcohol abuse, in remission: Secondary | ICD-10-CM | POA: Diagnosis present

## 2017-04-26 DIAGNOSIS — C7951 Secondary malignant neoplasm of bone: Secondary | ICD-10-CM | POA: Diagnosis not present

## 2017-04-26 DIAGNOSIS — Z170 Estrogen receptor positive status [ER+]: Secondary | ICD-10-CM | POA: Diagnosis not present

## 2017-04-26 DIAGNOSIS — F3130 Bipolar disorder, current episode depressed, mild or moderate severity, unspecified: Secondary | ICD-10-CM | POA: Diagnosis not present

## 2017-04-26 LAB — CBC
HCT: 32.3 % — ABNORMAL LOW (ref 36.0–46.0)
Hemoglobin: 10.6 g/dL — ABNORMAL LOW (ref 12.0–15.0)
MCH: 31.2 pg (ref 26.0–34.0)
MCHC: 32.8 g/dL (ref 30.0–36.0)
MCV: 95 fL (ref 78.0–100.0)
Platelets: 157 10*3/uL (ref 150–400)
RBC: 3.4 MIL/uL — ABNORMAL LOW (ref 3.87–5.11)
RDW: 12.7 % (ref 11.5–15.5)
WBC: 4.6 10*3/uL (ref 4.0–10.5)

## 2017-04-26 LAB — COMPREHENSIVE METABOLIC PANEL
ALT: 10 U/L — ABNORMAL LOW (ref 14–54)
AST: 16 U/L (ref 15–41)
Albumin: 3.8 g/dL (ref 3.5–5.0)
Alkaline Phosphatase: 46 U/L (ref 38–126)
Anion gap: 6 (ref 5–15)
BUN: 10 mg/dL (ref 6–20)
CO2: 24 mmol/L (ref 22–32)
Calcium: 8.9 mg/dL (ref 8.9–10.3)
Chloride: 111 mmol/L (ref 101–111)
Creatinine, Ser: 0.78 mg/dL (ref 0.44–1.00)
GFR calc Af Amer: >60 mL/min (ref >60)
GFR calc non Af Amer: >60 mL/min (ref >60)
Glucose, Bld: 80 mg/dL (ref 65–99)
Potassium: 3.9 mmol/L (ref 3.5–5.1)
Sodium: 141 mmol/L (ref 135–145)
Total Bilirubin: 0.5 mg/dL (ref 0.3–1.2)
Total Protein: 5.8 g/dL — ABNORMAL LOW (ref 6.5–8.1)

## 2017-04-26 LAB — LITHIUM LEVEL
Lithium Lvl: 1.28 mmol/L — ABNORMAL HIGH (ref 0.60–1.20)
Lithium Lvl: 1.02 mmol/L (ref 0.60–1.20)
Lithium Lvl: 0.73 mmol/L (ref 0.60–1.20)

## 2017-04-26 LAB — TSH: TSH: 1.475 u[IU]/mL (ref 0.350–4.500)

## 2017-04-26 LAB — MAGNESIUM: Magnesium: 1.8 mg/dL (ref 1.7–2.4)

## 2017-04-26 MED ORDER — CLONAZEPAM 0.5 MG PO TABS
0.5000 mg | ORAL_TABLET | Freq: Once | ORAL | Status: AC
  Administered 2017-04-26: 0.5 mg via ORAL
  Filled 2017-04-26: qty 1

## 2017-04-26 MED ORDER — NICOTINE 21 MG/24HR TD PT24
21.0000 mg | MEDICATED_PATCH | Freq: Every day | TRANSDERMAL | Status: DC
  Administered 2017-04-26 – 2017-04-30 (×5): 21 mg via TRANSDERMAL
  Filled 2017-04-26 (×5): qty 1

## 2017-04-26 MED ORDER — ACETAMINOPHEN 325 MG PO TABS
650.0000 mg | ORAL_TABLET | Freq: Four times a day (QID) | ORAL | Status: DC | PRN
  Administered 2017-04-27 – 2017-04-30 (×3): 650 mg via ORAL
  Filled 2017-04-26 (×4): qty 2

## 2017-04-26 MED ORDER — HALOPERIDOL LACTATE 5 MG/ML IJ SOLN
5.0000 mg | Freq: Four times a day (QID) | INTRAMUSCULAR | Status: DC | PRN
  Filled 2017-04-26 (×2): qty 1

## 2017-04-26 MED ORDER — HALOPERIDOL LACTATE 5 MG/ML IJ SOLN
INTRAMUSCULAR | Status: AC
  Administered 2017-04-26: 5 mg via INTRAVENOUS
  Filled 2017-04-26: qty 1

## 2017-04-26 MED ORDER — HALOPERIDOL LACTATE 5 MG/ML IJ SOLN
5.0000 mg | Freq: Four times a day (QID) | INTRAMUSCULAR | Status: DC | PRN
  Administered 2017-04-26 – 2017-04-29 (×6): 5 mg via INTRAVENOUS
  Filled 2017-04-26 (×6): qty 1

## 2017-04-26 MED ORDER — HALOPERIDOL LACTATE 5 MG/ML IJ SOLN
5.0000 mg | Freq: Once | INTRAMUSCULAR | Status: AC
  Administered 2017-04-26: 5 mg via INTRAVENOUS

## 2017-04-26 MED ORDER — TRAMADOL HCL 50 MG PO TABS
50.0000 mg | ORAL_TABLET | Freq: Four times a day (QID) | ORAL | Status: DC | PRN
  Administered 2017-04-26 – 2017-04-30 (×10): 50 mg via ORAL
  Filled 2017-04-26 (×11): qty 1

## 2017-04-26 MED ORDER — SODIUM CHLORIDE 0.9 % IV SOLN: INTRAVENOUS | Status: AC

## 2017-04-26 MED ORDER — TRAZODONE HCL 50 MG PO TABS
100.0000 mg | ORAL_TABLET | Freq: Every day | ORAL | Status: DC
  Administered 2017-04-26 – 2017-04-29 (×4): 100 mg via ORAL
  Filled 2017-04-26 (×4): qty 2

## 2017-04-26 MED ORDER — ONDANSETRON HCL 4 MG/2ML IJ SOLN
INTRAMUSCULAR | Status: AC
Start: 2017-04-26 — End: 2017-04-26
  Filled 2017-04-26: qty 2

## 2017-04-26 MED ORDER — LAMOTRIGINE 25 MG PO TABS
25.0000 mg | ORAL_TABLET | Freq: Two times a day (BID) | ORAL | Status: DC
  Administered 2017-04-26 – 2017-04-30 (×9): 25 mg via ORAL
  Filled 2017-04-26 (×9): qty 1

## 2017-04-26 MED ORDER — LURASIDONE HCL 40 MG PO TABS
40.0000 mg | ORAL_TABLET | Freq: Every day | ORAL | Status: DC
  Administered 2017-04-28 – 2017-04-30 (×3): 40 mg via ORAL
  Filled 2017-04-26 (×4): qty 1

## 2017-04-26 MED ORDER — ALUM & MAG HYDROXIDE-SIMETH 200-200-20 MG/5ML PO SUSP
30.0000 mL | ORAL | Status: DC | PRN
  Administered 2017-04-26: 30 mL via ORAL
  Filled 2017-04-26: qty 30

## 2017-04-26 MED ORDER — SODIUM CHLORIDE 0.9% FLUSH
10.0000 mL | INTRAVENOUS | Status: DC | PRN
  Administered 2017-04-27: 10 mL
  Filled 2017-04-26: qty 40

## 2017-04-26 MED ORDER — ONDANSETRON HCL 4 MG/2ML IJ SOLN
4.0000 mg | Freq: Four times a day (QID) | INTRAMUSCULAR | Status: DC | PRN
  Administered 2017-04-26 – 2017-04-30 (×5): 4 mg via INTRAVENOUS
  Filled 2017-04-26 (×4): qty 2

## 2017-04-26 MED ORDER — HEPARIN SOD (PORK) LOCK FLUSH 100 UNIT/ML IV SOLN: 500.0000 [IU] | INTRAVENOUS | Status: DC | PRN

## 2017-04-26 MED ORDER — HEPARIN SOD (PORK) LOCK FLUSH 100 UNIT/ML IV SOLN
500.0000 [IU] | INTRAVENOUS | Status: AC | PRN
  Administered 2017-04-26: 500 [IU]

## 2017-04-26 MED ORDER — SODIUM CHLORIDE 0.9 % IV SOLN
INTRAVENOUS | Status: DC
  Administered 2017-04-26 – 2017-04-27 (×4): via INTRAVENOUS

## 2017-04-26 MED ORDER — GABAPENTIN 300 MG PO CAPS
300.0000 mg | ORAL_CAPSULE | Freq: Two times a day (BID) | ORAL | Status: DC
  Administered 2017-04-26 – 2017-04-30 (×9): 300 mg via ORAL
  Filled 2017-04-26 (×9): qty 1

## 2017-04-26 MED ORDER — ACETAMINOPHEN 650 MG RE SUPP: 650.0000 mg | Freq: Four times a day (QID) | RECTAL | Status: DC | PRN

## 2017-04-26 NOTE — Progress Notes (Signed)
Pt is now stating she wants to stay and see the doctor and physical therapy today. Security present to help with her cooperation.

## 2017-04-26 NOTE — Progress Notes (Addendum)
CSW assisting doctor with IVC paperwork. CSW faxed IVC paperwork and contacted magistrate office to confirm paperwork received, no answer. CSW will continue to try and reach magistrate office to confirm paperwork was received.    9:45 AM CSW contacted by magistrate office and spoke with Angelina Sheriff, who confirmed that IVC paperwork was received and that patient would be served.   Abundio Miu, [place] Social Worker [facility] Cell#: [phone]

## 2017-04-26 NOTE — Progress Notes (Signed)
Discussed the use of Haldol with the patient and she agreed to get the shot.  Haldol 5mg  given IM. Andre Lefort

## 2017-04-26 NOTE — ED Notes (Signed)
Patient yelling out that she needs all the lights and noise to stop in the hallway so she can get some sleep. Advised patient that we could not turn out the hall lights and the necessary noises and talking in the hallway may not be able to be avoided. She yelled "well I am trying to sleep and I will keep yelling if yall don't stop!"

## 2017-04-26 NOTE — Progress Notes (Signed)
Report from [place], Therapist, sports. Care assumed for pt at this time. Assessment unchanged from AM assessment. Pt restless in bed. States she is feeling "trapped".  Very anxious. MD paged to make aware. Await further orders. Pt calms with staff presence. Requests popsicles and provided. Sitter bedside.

## 2017-04-26 NOTE — Progress Notes (Signed)
Called to room by pt sitter who states pt took a pill out of her purse and swallowed it. Upon entering room pt handed this writer an empty bottle of klonopin 0.5mg  and states "I only took the one because I want to sleep. See?" Pt sticks out her tongue to show one small pill on her tongue. This Probation officer requested that pt please spit out the pill.  She removed the pill from her tongue and then said "Are you going to tell the doctor?  I'm just going to take it anyway." and proceeded to put the pill back her mouth and swallow it.  Dr Starla Link made aware, no further orders received. All of pt's belongings were searched in pt's presence. Cigarettes and lighter removed, labelled, and locked in suicide cabinet on 4W.. Pt agreeable. Will continue to monitor pt carefully.

## 2017-04-26 NOTE — Consult Note (Signed)
[facility] Consult   Reason for Consult:  Psychosis, agitation, Threatening to sign out Referring Physician: Dr.  Starla Link Patient Identification: [name] MRN:  [id number] Principal Diagnosis: Bipolar I disorder, most recent episode depressed [facility]) Diagnosis:   Patient Active Problem List   Diagnosis Date Noted  . Bipolar I disorder, most recent episode depressed ([place]) [F31.30] 12/08/2016    Priority: High  . Lithium overdose [T56.891A] 04/26/2017  . Acute encephalopathy [G93.40] 03/16/2017  . Acute lower UTI [N39.0]   . Hypokalemia [E87.6] 02/18/2017  . Gait abnormality [R26.9] 01/06/2017  . Adjustment disorder with anxiety [F43.22] 12/06/2016  . Delirium due to another medical condition [F05] 12/03/2016  . Confusion [R41.0] 12/03/2016  . Diarrhea [R19.7] 12/03/2016  . Metastatic breast cancer ([place]) [C50.919]   . Cerumen impaction [H61.20] 11/25/2016  . Otitis media [H66.90] 11/06/2016  . Brain metastasis ([place]) [C79.31] 07/27/2016  . Iron deficiency anemia [D50.9] 06/26/2016  . Bone metastases ([place]) [C79.51] 06/03/2016  . Primary cancer of lower-inner quadrant of left female breast ([place]) [C50.312] 06/01/2016  . Pap smear for cervical cancer screening [Z12.4] 03/28/2015  . Current smoker [F17.200] 03/28/2015  . Healthcare maintenance [Z00.00] 03/28/2015  . Seasonal allergies [J30.2] 03/28/2015  . Anxiety state [F41.1] 02/28/2015  . Fibromyalgia [M79.7] 02/28/2015  . Family history of diabetes mellitus [Z83.3] 02/28/2015  . H/O alcohol abuse [Z87.898]     Total Time spent with patient: 45 minutes  Subjective:   [name] is a 56 y.o. female patient admitted with agitation, Lithium overdose.  HPI:  Patient with history of metastatic breast cancer, Bipolar disorder, alcohol use disorder in remission, Delirium and chronic anemia. Patient reports that she came to the ER after she accidentally took a bunch of Lithium. Patient was recently admitted to [facility] for confusion and tremors and at that time patient's lithium was discontinued. However, patient reports that she mistakenly took more Lithium because she was unable to remember that it was discontinued. Patient presents with agitation, poor insight, mood lability and threatening to leave the hospital against medical advice.  Past Psychiatric History: as above  Risk to Self: Is patient at risk for suicide?: No Risk to Others:   Prior Inpatient Therapy:   Prior Outpatient Therapy:    Past Medical History:  Past Medical History:  Diagnosis Date  . Alcohol abuse   . Anemia    during chemo  . Anxiety    At age 79  . Arthritis Dx 2010  . Bipolar disorder ([place])   . Cancer ([place])    breast mets to brain  . Chronic pain   . Complication of anesthesia   . Depression   . Fibromyalgia Dx 2005  . GERD (gastroesophageal reflux disease)   . Headache    hx  migraines  . Opiate dependence ([place])   . PONV (postoperative nausea and vomiting)   . Port-a-cath in place   . PTSD (post-traumatic stress disorder)     Past Surgical History:  Procedure Laterality Date  . APPLICATION OF CRANIAL NAVIGATION N/A 08/14/2016   Procedure: APPLICATION OF CRANIAL NAVIGATION;  Surgeon: Erline Levine, MD;  Location: [facility];  Service: Neurosurgery;  Laterality: N/A;  . BREAST RECONSTRUCTION Left    with silicone implant  . CRANIOTOMY N/A 08/14/2016   Procedure: CRANIOTOMY TUMOR EXCISION WITH Lucky Rathke;  Surgeon: Erline Levine, MD;  Location: [facility];  Service: Neurosurgery;  Laterality: N/A;  . FIBULA FRACTURE SURGERY Left   . MASTECTOMY Left   .  RADIOLOGY WITH ANESTHESIA N/A 07/23/2016   Procedure: MRI OF BRAIN WITH AND WITHOUT;  Surgeon: Medication Radiologist, MD;  Location: [facility];  Service: Radiology;  Laterality: N/A;  . RADIOLOGY WITH ANESTHESIA N/A 09/08/2016   Procedure: MRI OF BRAIN WITH AND WITHOUT CONTRAST;  Surgeon: Medication Radiologist, MD;  Location: [facility];  Service: Radiology;   Laterality: N/A;  . RADIOLOGY WITH ANESTHESIA N/A 12/10/2016   Procedure: MRI OF BRAIN WITH AND WITHOUT;  Surgeon: Medication Radiologist, MD;  Location: [facility];  Service: Radiology;  Laterality: N/A;  . RADIOLOGY WITH ANESTHESIA N/A 03/02/2017   Procedure: MRI of BRAIN W and W/OUT CONTRAST;  Surgeon: Medication Radiologist, MD;  Location: [facility];  Service: Radiology;  Laterality: N/A;  . right power port placement Right    Family History:  Family History  Problem Relation Age of Onset  . Diabetes Mother   . Bipolar disorder Mother   . CAD Father    Family Psychiatric  History:  Social History:  History  Alcohol Use No    Comment: no ETOH since 08/22/12     History  Drug Use No    Comment: prescription opiates from the street daily until Sunday    Social History   Social History  . Marital status: Single    Spouse name: N/A  . Number of children: N/A  . Years of education: N/A   Social History Main Topics  . Smoking status: Current Every Day Smoker    Packs/day: 0.25    Types: Cigarettes  . Smokeless tobacco: Never Used     Comment: Pt is on Chantix at present time  . Alcohol use No     Comment: no ETOH since 08/22/12  . Drug use: No     Comment: prescription opiates from the street daily until Sunday  . Sexual activity: No     Comment: ablation   Other Topics Concern  . None   Social History Narrative  . None   Additional Social History:    Allergies:   Allergies  Allergen Reactions  . Demerol Itching and Nausea And Vomiting  . Erythromycin Rash    Labs:  Results for orders placed or performed during the hospital encounter of 04/25/17 (from the past 48 hour(s))  CBC with Differential     Status: Abnormal   Collection Time: 04/25/17  9:39 PM  Result Value Ref Range   WBC 5.8 4.0 - 10.5 K/uL   RBC 3.73 (L) 3.87 - 5.11 MIL/uL   Hemoglobin 11.5 (L) 12.0 - 15.0 g/dL   HCT 35.5 (L) 36.0 - 46.0 %   MCV 95.2 78.0 - 100.0 fL   MCH 30.8 26.0 - 34.0 pg   MCHC  32.4 30.0 - 36.0 g/dL   RDW 12.7 11.5 - 15.5 %   Platelets 197 150 - 400 K/uL   Neutrophils Relative % 73 %   Neutro Abs 4.2 1.7 - 7.7 K/uL   Lymphocytes Relative 17 %   Lymphs Abs 1.0 0.7 - 4.0 K/uL   Monocytes Relative 7 %   Monocytes Absolute 0.4 0.1 - 1.0 K/uL   Eosinophils Relative 2 %   Eosinophils Absolute 0.1 0.0 - 0.7 K/uL   Basophils Relative 1 %   Basophils Absolute 0.0 0.0 - 0.1 K/uL  Comprehensive metabolic panel     Status: Abnormal   Collection Time: 04/25/17  9:39 PM  Result Value Ref Range   Sodium 140 135 - 145 mmol/L   Potassium 3.7 3.5 -  5.1 mmol/L   Chloride 107 101 - 111 mmol/L   CO2 26 22 - 32 mmol/L   Glucose, Bld 86 65 - 99 mg/dL   BUN 12 6 - 20 mg/dL   Creatinine, Ser 0.90 0.44 - 1.00 mg/dL   Calcium 9.6 8.9 - 10.3 mg/dL   Total Protein 6.7 6.5 - 8.1 g/dL   Albumin 4.3 3.5 - 5.0 g/dL   AST 13 (L) 15 - 41 U/L   ALT 8 (L) 14 - 54 U/L   Alkaline Phosphatase 48 38 - 126 U/L   Total Bilirubin 0.4 0.3 - 1.2 mg/dL   GFR calc non Af Amer >60 >60 mL/min   GFR calc Af Amer >60 >60 mL/min    Comment: (NOTE) The eGFR has been calculated using the CKD EPI equation. This calculation has not been validated in all clinical situations. eGFR's persistently <60 mL/min signify possible Chronic Kidney Disease.    Anion gap 7 5 - 15  Ethanol     Status: None   Collection Time: 04/25/17  9:40 PM  Result Value Ref Range   Alcohol, Ethyl (B) <5 <5 mg/dL    Comment:        LOWEST DETECTABLE LIMIT FOR SERUM ALCOHOL IS 5 mg/dL FOR MEDICAL PURPOSES ONLY   Urinalysis, Routine w reflex microscopic     Status: Abnormal   Collection Time: 04/25/17 10:06 PM  Result Value Ref Range   Color, Urine YELLOW YELLOW   APPearance CLEAR CLEAR   Specific Gravity, Urine 1.012 1.005 - 1.030   pH 5.0 5.0 - 8.0   Glucose, UA NEGATIVE NEGATIVE mg/dL   Hgb urine dipstick SMALL (A) NEGATIVE   Bilirubin Urine NEGATIVE NEGATIVE   Ketones, ur 20 (A) NEGATIVE mg/dL   Protein, ur  NEGATIVE NEGATIVE mg/dL   Nitrite NEGATIVE NEGATIVE   Leukocytes, UA MODERATE (A) NEGATIVE   RBC / HPF 0-5 0 - 5 RBC/hpf   WBC, UA 0-5 0 - 5 WBC/hpf   Bacteria, UA NONE SEEN NONE SEEN   Squamous Epithelial / LPF 0-5 (A) NONE SEEN   Mucous PRESENT   Rapid urine drug screen (hospital performed)     Status: Abnormal   Collection Time: 04/25/17 10:06 PM  Result Value Ref Range   Opiates NONE DETECTED NONE DETECTED   Cocaine NONE DETECTED NONE DETECTED   Benzodiazepines POSITIVE (A) NONE DETECTED   Amphetamines NONE DETECTED NONE DETECTED   Tetrahydrocannabinol NONE DETECTED NONE DETECTED   Barbiturates NONE DETECTED NONE DETECTED    Comment:        DRUG SCREEN FOR MEDICAL PURPOSES ONLY.  IF CONFIRMATION IS NEEDED FOR ANY PURPOSE, NOTIFY LAB WITHIN 5 DAYS.        LOWEST DETECTABLE LIMITS FOR URINE DRUG SCREEN Drug Class       Cutoff (ng/mL) Amphetamine      1000 Barbiturate      200 Benzodiazepine   161 Tricyclics       096 Opiates          300 Cocaine          300 THC              50   Lithium level     Status: Abnormal   Collection Time: 04/25/17 11:27 PM  Result Value Ref Range   Lithium Lvl 1.28 (H) 0.60 - 1.20 mmol/L  Lithium level     Status: None   Collection Time: 04/26/17  5:36 AM  Result Value Ref  Range   Lithium Lvl 1.02 0.60 - 1.20 mmol/L  Comprehensive metabolic panel     Status: Abnormal   Collection Time: 04/26/17  5:36 AM  Result Value Ref Range   Sodium 141 135 - 145 mmol/L   Potassium 3.9 3.5 - 5.1 mmol/L   Chloride 111 101 - 111 mmol/L   CO2 24 22 - 32 mmol/L   Glucose, Bld 80 65 - 99 mg/dL   BUN 10 6 - 20 mg/dL   Creatinine, Ser 0.78 0.44 - 1.00 mg/dL   Calcium 8.9 8.9 - 10.3 mg/dL   Total Protein 5.8 (L) 6.5 - 8.1 g/dL   Albumin 3.8 3.5 - 5.0 g/dL   AST 16 15 - 41 U/L   ALT 10 (L) 14 - 54 U/L   Alkaline Phosphatase 46 38 - 126 U/L   Total Bilirubin 0.5 0.3 - 1.2 mg/dL   GFR calc non Af Amer >60 >60 mL/min   GFR calc Af Amer >60 >60 mL/min     Comment: (NOTE) The eGFR has been calculated using the CKD EPI equation. This calculation has not been validated in all clinical situations. eGFR's persistently <60 mL/min signify possible Chronic Kidney Disease.    Anion gap 6 5 - 15  CBC     Status: Abnormal   Collection Time: 04/26/17  5:36 AM  Result Value Ref Range   WBC 4.6 4.0 - 10.5 K/uL   RBC 3.40 (L) 3.87 - 5.11 MIL/uL   Hemoglobin 10.6 (L) 12.0 - 15.0 g/dL   HCT 32.3 (L) 36.0 - 46.0 %   MCV 95.0 78.0 - 100.0 fL   MCH 31.2 26.0 - 34.0 pg   MCHC 32.8 30.0 - 36.0 g/dL   RDW 12.7 11.5 - 15.5 %   Platelets 157 150 - 400 K/uL  TSH     Status: None   Collection Time: 04/26/17  5:36 AM  Result Value Ref Range   TSH 1.475 0.350 - 4.500 uIU/mL    Comment: Performed by a 3rd Generation assay with a functional sensitivity of <=0.01 uIU/mL.  Magnesium     Status: None   Collection Time: 04/26/17  5:36 AM  Result Value Ref Range   Magnesium 1.8 1.7 - 2.4 mg/dL    Current Facility-Administered Medications  Medication Dose Route Frequency Provider Last Rate Last Dose  . 0.9 %  sodium chloride infusion   Intravenous Continuous Rise Patience, MD 150 mL/hr at 04/26/17 0945    . 0.9 %  sodium chloride infusion   Intravenous Continuous Rise Patience, MD      . acetaminophen (TYLENOL) tablet 650 mg  650 mg Oral Q6H PRN Rise Patience, MD       Or  . acetaminophen (TYLENOL) suppository 650 mg  650 mg Rectal Q6H PRN Rise Patience, MD      . gabapentin (NEURONTIN) capsule 300 mg  300 mg Oral BID Valerio Pinard, MD      . haloperidol lactate (HALDOL) injection 5 mg  5 mg Intramuscular Q6H PRN Alekh, Kshitiz, MD      . heparin lock flush 100 unit/mL  500 Units Intracatheter Prior to discharge Aline August, MD      . lamoTRIgine (LAMICTAL) tablet 25 mg  25 mg Oral BID Akeia Perot, MD      . Derrill Memo ON 04/27/2017] lurasidone (LATUDA) tablet 40 mg  40 mg Oral Q breakfast Azriel Jakob, MD      . nicotine  (NICODERM CQ -  dosed in mg/24 hours) patch 21 mg  21 mg Transdermal Daily Starla Link, Kshitiz, MD   21 mg at 04/26/17 1005  . sodium chloride flush (NS) 0.9 % injection 10-40 mL  10-40 mL Intracatheter PRN Alekh, Kshitiz, MD      . traMADol (ULTRAM) tablet 50 mg  50 mg Oral Q6H PRN Starla Link, Kshitiz, MD      . traZODone (DESYREL) tablet 100 mg  100 mg Oral QHS Khiana Camino, MD        Musculoskeletal: Strength & Muscle Tone: within normal limits Gait & Station: unsteady Patient leans: Front  Psychiatric Specialty Exam: Physical Exam  Psychiatric: Thought content normal. Her mood appears anxious. Her affect is labile. Her speech is rapid and/or pressured, tangential and slurred. She is agitated, aggressive and actively hallucinating. Cognition and memory are impaired. She expresses impulsivity.    Review of Systems  Constitutional: Positive for malaise/fatigue and weight loss.  HENT: Negative.   Eyes: Negative.   Respiratory: Negative.   Cardiovascular: Negative.   Gastrointestinal: Negative.   Genitourinary: Negative.   Musculoskeletal: Positive for myalgias.  Skin: Negative.   Neurological: Positive for tremors.  Psychiatric/Behavioral: Positive for depression. The patient is nervous/anxious and has insomnia.     Blood pressure (!) 151/95, pulse 90, temperature 98.5 F (36.9 C), temperature source Oral, resp. rate 20, height _0  (1.651 m), weight 57.6 kg (127 lb), SpO2 94 %.Body mass index is 21.13 kg/m.  General Appearance: Casual  Eye Contact:  Minimal  Speech:  Clear and Coherent  Volume:  Increased  Mood:  Anxious and Irritable  Affect:  Labile  Thought Process:  Disorganized  Orientation:  Other:  only to place and person  Thought Content:  Illogical  Suicidal Thoughts:  No  Homicidal Thoughts:  No  Memory:  Immediate;   Fair Recent;   Fair Remote;   Fair  Judgement:  Poor  Insight:  Lacking  Psychomotor Activity:  Increased and Restlessness  Concentration:   Concentration: Poor and Attention Span: Poor  Recall:  [employer] of Knowledge:  Fair  Language:  Good  Akathisia:  No  Handed:  Right  AIMS (if indicated):     Assets:  Communication Skills  ADL's:  Impaired  Cognition:  Impaired,  Mild  Sleep:   poor     Treatment Plan Summary: Daily contact with patient to assess and evaluate symptoms and progress in treatment and Medication management  -Continue 1:1 Sitter for safety -Repeat Lithium level in on 2/29/18 -Lamictal 25 mg bid for mood -Latuda 40 mg qhs for psychosis. -Gabapentin 300 mg bid for aggression/anxiety -Trazodone 100 mg qhs for sleep.  Disposition: Recommend psychiatric Inpatient admission when medically cleared.  Patient will benefit from Geropsychiatric inpatient placement for stabilization.  Corena Pilgrim, MD 04/26/2017 1:00 PM

## 2017-04-26 NOTE — Progress Notes (Signed)
Pt arrived from Observation Unit.  Very anxious and wanting to leave AMA.  Pt states she "should never have came", she "has to go home", and "he will not let me back in the house".  Assured pt we were here to help her and keep her safe.  Walked her in the hall and then back to room.  Continues to be very restless and wanting to leave the hospital.  Andre Lefort

## 2017-04-26 NOTE — Progress Notes (Signed)
Pt very restless, agitated. Does not want Tramadol, states "It doesn't do anything for me." Repeatedly trying to stand up, stating "I'm going home. You can't keep me here." Very unsteady on her feet. IV line pulling, pt states "I don't care. I'm taking it out and going home.". Dr Starla Link aware and order for repeat dose of Haldol received and administered. Pt given warm blankets and reassuring presence provided. Appears calmer. Will monitor. Sitter remains at bedside.

## 2017-04-26 NOTE — Progress Notes (Signed)
Called Dr. Starla Link about pt possible AMA and multiple security calls through night.

## 2017-04-26 NOTE — H&P (Signed)
History and Physical    [name] GYB:[id number] DOB: 1961/03/17 DOA: 04/25/2017  PCP: Arnoldo Morale, MD  Patient coming from: Home.  Chief Complaint: Tremors and weakness.  HPI: [name] is a 56 y.o. female with history of metastatic breast cancer, bipolar disorder, alcohol abuse in remission, anemia presents to the ER because of weakness and tremors. Patient was recently admitted for confusion and tremors and at that time patient's lithium was discontinued. On follow-up with psychiatrist patient lithium was again started back. Patient originally presented to [facility] yesterday with complaints of weakness and falls. Lithium levels at that time was 1.6. Patient left AGAINST MEDICAL ADVICE and came to [facility].   ED Course: Patient was complaining of weakness and tremors and on exam patient has generalized tremors. Lithium levels O year was 1.2. Sodium and creatinine were normal. Patient was given 1 L normal saline bolus and admitted for further observation. On exam patient appears mildly confused but is oriented to time place and person. Moves all extremities. Patient states she had taken lithium as advised and not overdosed. Though her in the earlier visit yesterday she said that she may have taken extra dose without knowledge.  Review of Systems: As per HPI, rest all negative.   Past Medical History:  Diagnosis Date  . Alcohol abuse   . Anemia    during chemo  . Anxiety    At age 51  . Arthritis Dx 2010  . Bipolar disorder ([place])   . Cancer ([place])    breast mets to brain  . Chronic pain   . Complication of anesthesia   . Depression   . Fibromyalgia Dx 2005  . GERD (gastroesophageal reflux disease)   . Headache    hx  migraines  . Opiate dependence ([place])   . PONV (postoperative nausea and vomiting)   . Port-a-cath in place   . PTSD (post-traumatic stress disorder)     Past Surgical History:  Procedure Laterality Date  . APPLICATION OF CRANIAL  NAVIGATION N/A 08/14/2016   Procedure: APPLICATION OF CRANIAL NAVIGATION;  Surgeon: Erline Levine, MD;  Location: [place] NEURO ORS;  Service: Neurosurgery;  Laterality: N/A;  . BREAST RECONSTRUCTION Left    with silicone implant  . CRANIOTOMY N/A 08/14/2016   Procedure: CRANIOTOMY TUMOR EXCISION WITH Lucky Rathke;  Surgeon: Erline Levine, MD;  Location: [place] NEURO ORS;  Service: Neurosurgery;  Laterality: N/A;  . FIBULA FRACTURE SURGERY Left   . MASTECTOMY Left   . RADIOLOGY WITH ANESTHESIA N/A 07/23/2016   Procedure: MRI OF BRAIN WITH AND WITHOUT;  Surgeon: Medication Radiologist, MD;  Location: [place];  Service: Radiology;  Laterality: N/A;  . RADIOLOGY WITH ANESTHESIA N/A 09/08/2016   Procedure: MRI OF BRAIN WITH AND WITHOUT CONTRAST;  Surgeon: Medication Radiologist, MD;  Location: [place];  Service: Radiology;  Laterality: N/A;  . RADIOLOGY WITH ANESTHESIA N/A 12/10/2016   Procedure: MRI OF BRAIN WITH AND WITHOUT;  Surgeon: Medication Radiologist, MD;  Location: [place];  Service: Radiology;  Laterality: N/A;  . RADIOLOGY WITH ANESTHESIA N/A 03/02/2017   Procedure: MRI of BRAIN W and W/OUT CONTRAST;  Surgeon: Medication Radiologist, MD;  Location: [place];  Service: Radiology;  Laterality: N/A;  . right power port placement Right      reports that she has been smoking Cigarettes.  She has been smoking about 0.25 packs per day. She has never used smokeless tobacco. She reports that she does not drink alcohol or use drugs.  Allergies  Allergen Reactions  . Demerol Itching and Nausea And Vomiting  . Erythromycin Rash    Family History  Problem Relation Age of Onset  . Diabetes Mother   . Bipolar disorder Mother   . CAD Father     Prior to Admission medications   Medication Sig Start Date End Date Taking? Authorizing Provider  anastrozole (ARIMIDEX) 1 MG tablet Take 1 tablet (1 mg total) by mouth daily. 08/25/16   Magrinat, Virgie Dad, MD  Biotin 1 MG CAPS Take 1 mg by mouth daily.    [provider]  cholecalciferol (VITAMIN D) 1000 units tablet Take 1,000 Units by mouth daily.    [provider]  clonazePAM (KLONOPIN) 0.5 MG tablet Take 1 tablet (0.5 mg total) by mouth daily as needed for anxiety. 04/15/17   Lauree Chandler, NP  gabapentin (NEURONTIN) 300 MG capsule Take 1 capsule (300 mg total) by mouth 2 (two) times daily. 04/06/17   Janece Canterbury, MD  ibuprofen (ADVIL,MOTRIN) 800 MG tablet Take 800 mg by mouth 3 (three) times daily.    [provider]  lamoTRIgine (LAMICTAL) 200 MG tablet Take 1 tablet (200 mg total) by mouth daily. 04/07/17   Janece Canterbury, MD  lithium 300 MG tablet Take 300 mg by mouth 2 (two) times daily.    [provider]  loratadine (CLARITIN) 10 MG tablet Take 1 tablet (10 mg total) by mouth daily. 04/21/17   Argentina Donovan, PA-C  nicotine (NICODERM CQ - DOSED IN MG/24 HR) 7 mg/24hr patch Place 7 mg onto the skin daily.    [provider]  Oxcarbazepine (TRILEPTAL) 300 MG tablet Take 1 tablet (300 mg total) by mouth 2 (two) times daily. 04/06/17   Janece Canterbury, MD  pantoprazole (PROTONIX) 40 MG tablet Take 40 mg by mouth daily.    [provider]  potassium chloride (MICRO-K) 10 MEQ CR capsule Take 1 capsule (10 mEq total) by mouth daily. 02/18/17   Arnoldo Morale, MD  QUEtiapine (SEROQUEL) 50 MG tablet Take 1 tablet (50 mg total) by mouth 2 (two) times daily. 03/03/17   Rosita Fire, MD  traMADol (ULTRAM) 50 MG tablet Take one tablet by mouth 4 times daily as needed for pain. 04/15/17   Reed, Tiffany L, DO  traZODone (DESYREL) 100 MG tablet Take 200 mg by mouth at bedtime.     [provider]    Physical Exam: Vitals:   04/25/17 2300 04/25/17 2330 04/25/17 2347 04/26/17 0121  BP: 103/67 114/80 114/80 117/88  Pulse: 76  83 72  Resp:   16 18  Temp:      TempSrc:      SpO2: 97%  100% 99%  Weight:      Height:          Constitutional: Moderately built and nourished. Vitals:   04/25/17  2300 04/25/17 2330 04/25/17 2347 04/26/17 0121  BP: 103/67 114/80 114/80 117/88  Pulse: 76  83 72  Resp:   16 18  Temp:      TempSrc:      SpO2: 97%  100% 99%  Weight:      Height:       Eyes: Anicteric no pallor. ENMT: No discharge from the ears eyes nose and mouth. Neck: No neck rigidity. No mass felt. Respiratory: No rhonchi or crepitation. Cardiovascular: S1-S2 heard no murmurs present. Abdomen: Soft nontender bowel sounds present. No guarding or rigidity. Musculoskeletal: No edema. No joint effusion. Skin: No rash. Neurologic:  Alert awake oriented to time place and person but appears mildly confused. Moves all extremities. Psychiatric: Denies any suicidal thoughts.   Labs on Admission: I have personally reviewed following labs and imaging studies  CBC:  Recent Labs Lab 04/25/17 1051 04/25/17 2139  WBC 4.3 5.8  NEUTROABS 3.2 4.2  HGB 13.6 11.5*  HCT 42.0 35.5*  MCV 96.1 95.2  PLT 174 086   Basic Metabolic Panel:  Recent Labs Lab 04/25/17 1051 04/25/17 2139  NA 136 140  K 4.5 3.7  CL 104 107  CO2 24 26  GLUCOSE 80 86  BUN 13 12  CREATININE 0.90 0.90  CALCIUM 9.6 9.6   GFR: Estimated Creatinine Clearance: 62.8 mL/min (by C-G formula based on SCr of 0.9 mg/dL). Liver Function Tests:  Recent Labs Lab 04/25/17 1051 04/25/17 2139  AST 15 13*  ALT 12* 8*  ALKPHOS 47 48  BILITOT 0.3 0.4  PROT 6.9 6.7  ALBUMIN 4.4 4.3   No results for input(s): LIPASE, AMYLASE in the last 168 hours. No results for input(s): AMMONIA in the last 168 hours. Coagulation Profile: No results for input(s): INR, PROTIME in the last 168 hours. Cardiac Enzymes: No results for input(s): CKTOTAL, CKMB, CKMBINDEX, TROPONINI in the last 168 hours. BNP (last 3 results) No results for input(s): PROBNP in the last 8760 hours. HbA1C: No results for input(s): HGBA1C in the last 72 hours. CBG: No results for input(s): GLUCAP in the last 168 hours. Lipid Profile: No results for  input(s): CHOL, HDL, LDLCALC, TRIG, CHOLHDL, LDLDIRECT in the last 72 hours. Thyroid Function Tests: No results for input(s): TSH, T4TOTAL, FREET4, T3FREE, THYROIDAB in the last 72 hours. Anemia Panel: No results for input(s): VITAMINB12, FOLATE, FERRITIN, TIBC, IRON, RETICCTPCT in the last 72 hours. Urine analysis:    Component Value Date/Time   COLORURINE YELLOW 04/25/2017 2206   APPEARANCEUR CLEAR 04/25/2017 2206   LABSPEC 1.012 04/25/2017 2206   PHURINE 5.0 04/25/2017 2206   GLUCOSEU NEGATIVE 04/25/2017 2206   HGBUR SMALL (A) 04/25/2017 2206   BILIRUBINUR NEGATIVE 04/25/2017 2206   BILIRUBINUR negative 03/28/2015 1321   KETONESUR 20 (A) 04/25/2017 2206   PROTEINUR NEGATIVE 04/25/2017 2206   UROBILINOGEN 0.2 03/28/2015 1321   UROBILINOGEN 0.2 04/18/2013 1222   NITRITE NEGATIVE 04/25/2017 2206   LEUKOCYTESUR MODERATE (A) 04/25/2017 2206   Sepsis Labs: @LABRCNTIP (procalcitonin:4,lacticidven:4) )No results found for this or any previous visit (from the past 240 hour(s)).   Radiological Exams on Admission: No results found.  EKG: Independently reviewed. Normal sinus rhythm with QTC of 429 ms and QRS of 81 ms.  Assessment/Plan Principal Problem:   Lithium overdose Active Problems:   Metastatic breast cancer (HCC)   Bipolar I disorder, most recent episode depressed ([place])    1. Elevated lithium level/lithium overdose - I have discussed with poison control. Poison control has advised to continue with hydration to have 2 mL/kg body weight per hour output of urine. Recheck lithium levels again in a.m. At this time since patient is mildly confused poison control is advised to hold off patient's other psychiatric medications including Seroquel trazodone Trileptal and Klonopin for now until patient is more alert and awake. If patient's confusion persist may consider MRI brain given the history of metastatic brain cancer. Patient appears nonfocal. 2. Metastatic brain cancer being  followed by Dr. [name]. 3. Iron deficiency anemia - receives iron replacement at oncology office. 4. History of alcohol abuse in remission.   DVT prophylaxis: SCDs. Code Status: Full code.  Family Communication: Discussed with patient.  Disposition Plan: Home.  Consults called: None.  Admission status: Observation.    Rise Patience MD [facility] Pager [phone].  If 7PM-7AM, please contact night-coverage [email or web address] Password [facility]  04/26/2017, 2:53 AM

## 2017-04-26 NOTE — Progress Notes (Signed)
Patient ID: [name], female   DOB: 07/11/1961, 56 y.o.   MRN: [id number]  PROGRESS NOTE    [name]  OQH:[id number] DOB: 06/10/1961 DOA: 04/25/2017 PCP: Arnoldo Morale, MD   Brief Narrative:  56 y.o. female with history of metastatic breast cancer, bipolar disorder, alcohol abuse in remission, anemia presented to the ER because of weakness and tremors. Patient was recently admitted for confusion and tremors and at that time patient's lithium was discontinued. On follow-up with psychiatrist patient lithium was again started back. Patient originally presented to [facility] yesterday with complaints of weakness and falls. Lithium levels at that time was 1.6. Patient left AGAINST MEDICAL ADVICE and came to [facility].  Poison control was consulted and recommended IV hydration and recheck lithium levels a.m. Patient has remained confused but has been threatening to sign out [place].   Assessment & Plan:   Principal Problem:   Lithium overdose Active Problems:   Metastatic breast cancer ([place])   Bipolar I disorder, most recent episode depressed ([place])  1. Elevated lithium level/lithium overdose -  -  Continue IV fluids as per prior discussion with poison control. Poison control has advised to continue with hydration to have 2 mL/kg body weight per hour output of urine. Follow lithium levels  in a.m. At this time since patient is mildly confused poison control is advised to hold off patient's other psychiatric medications including Seroquel trazodone Trileptal and Klonopin for now until patient is more alert and awake. If patient's confusion persist may consider MRI brain given the history of metastatic brain cancer. Patient appears nonfocal. 2. Confusion and intermittent agitation in a patient with history of bipolar disorder: Patient is confused at this time and is threatening to sign out [place]. She is intermittently agitated. She might be a  threat to herself or others if she signed out [place]. I have already requested an urgent psychiatry evaluation. Patient will be  involuntarily committed till then. Haldol when necessary extreme agitation. Await psychiatry recommendations regarding other medications for her condition. 2. Metastatic brain cancer being followed by Dr. [name]. 3. Iron deficiency anemia - receives iron replacement at oncology office. 4. History of alcohol abuse in remission.    DVT prophylaxis: SCDs Code Status:  Full  Family Communication: None present at bedside  Disposition Plan: Will depend on patient's condition  Consultants: Psychiatry  Procedures: None  Antimicrobials: None   Subjective: Patient seen and examined at bedside. I have reviewed the vitals myself and tried to communicate with the patient briefly. I did not perform a physical exam because of patient threatening to leave [place] and getting intermittently agitated. Her speech is pressured, she is confused to time. She denies current homicidal or suicidal ideations.   Objective: Vitals:   04/25/17 2330 04/25/17 2347 04/26/17 0121 04/26/17 0400  BP: 114/80 114/80 117/88 (!) 151/95  Pulse:  83 72 90  Resp:  16 18 20   Temp:    98.5 F (36.9 C)  TempSrc:    Oral  SpO2:  100% 99% 94%  Weight:      Height:       No intake or output data in the 24 hours ending 04/26/17 0910 Filed Weights   04/25/17 1949  Weight: 57.6 kg (127 lb)       Data Reviewed: I have personally reviewed following labs and imaging studies  CBC:  Recent Labs Lab 04/25/17 1051 04/25/17 2139 04/26/17 0536  WBC 4.3  5.8 4.6  NEUTROABS 3.2 4.2  --   HGB 13.6 11.5* 10.6*  HCT 42.0 35.5* 32.3*  MCV 96.1 95.2 95.0  PLT 174 197 923   Basic Metabolic Panel:  Recent Labs Lab 04/25/17 1051 04/25/17 2139 04/26/17 0536  NA 136 140 141  K 4.5 3.7 3.9  CL 104 107 111  CO2 24 26 24   GLUCOSE 80 86 80  BUN 13 12 10     CREATININE 0.90 0.90 0.78  CALCIUM 9.6 9.6 8.9  MG  --   --  1.8   GFR: Estimated Creatinine Clearance: 70.7 mL/min (by C-G formula based on SCr of 0.78 mg/dL). Liver Function Tests:  Recent Labs Lab 04/25/17 1051 04/25/17 2139 04/26/17 0536  AST 15 13* 16  ALT 12* 8* 10*  ALKPHOS 47 48 46  BILITOT 0.3 0.4 0.5  PROT 6.9 6.7 5.8*  ALBUMIN 4.4 4.3 3.8   No results for input(s): LIPASE, AMYLASE in the last 168 hours. No results for input(s): AMMONIA in the last 168 hours. Coagulation Profile: No results for input(s): INR, PROTIME in the last 168 hours. Cardiac Enzymes: No results for input(s): CKTOTAL, CKMB, CKMBINDEX, TROPONINI in the last 168 hours. BNP (last 3 results) No results for input(s): PROBNP in the last 8760 hours. HbA1C: No results for input(s): HGBA1C in the last 72 hours. CBG: No results for input(s): GLUCAP in the last 168 hours. Lipid Profile: No results for input(s): CHOL, HDL, LDLCALC, TRIG, CHOLHDL, LDLDIRECT in the last 72 hours. Thyroid Function Tests:  Recent Labs  04/26/17 0536  TSH 1.475   Anemia Panel: No results for input(s): VITAMINB12, FOLATE, FERRITIN, TIBC, IRON, RETICCTPCT in the last 72 hours. Sepsis Labs: No results for input(s): PROCALCITON, LATICACIDVEN in the last 168 hours.  No results found for this or any previous visit (from the past 240 hour(s)).       Radiology Studies: No results found.      Scheduled Meds: . clonazePAM  0.5 mg Oral Once   Continuous Infusions: . sodium chloride 150 mL/hr at 04/26/17 0044  . sodium chloride       LOS: 0 days        Aline August, MD [facility] Pager [phone]  If 7PM-7AM, please contact night-coverage [email or web address] Password [email or web address] 04/26/2017, 9:10 AM

## 2017-04-27 DIAGNOSIS — F3130 Bipolar disorder, current episode depressed, mild or moderate severity, unspecified: Secondary | ICD-10-CM

## 2017-04-27 LAB — CBC WITH DIFFERENTIAL/PLATELET
Basophils Absolute: 0 10*3/uL (ref 0.0–0.1)
Basophils Relative: 1 %
Eosinophils Absolute: 0.1 10*3/uL (ref 0.0–0.7)
Eosinophils Relative: 3 %
HCT: 31.5 % — ABNORMAL LOW (ref 36.0–46.0)
Hemoglobin: 10.7 g/dL — ABNORMAL LOW (ref 12.0–15.0)
Lymphocytes Relative: 26 %
Lymphs Abs: 0.9 10*3/uL (ref 0.7–4.0)
MCH: 31.8 pg (ref 26.0–34.0)
MCHC: 34 g/dL (ref 30.0–36.0)
MCV: 93.8 fL (ref 78.0–100.0)
Monocytes Absolute: 0.4 10*3/uL (ref 0.1–1.0)
Monocytes Relative: 10 %
Neutro Abs: 2.3 10*3/uL (ref 1.7–7.7)
Neutrophils Relative %: 62 %
Platelets: 154 10*3/uL (ref 150–400)
RBC: 3.36 MIL/uL — ABNORMAL LOW (ref 3.87–5.11)
RDW: 12.8 % (ref 11.5–15.5)
WBC: 3.7 10*3/uL — ABNORMAL LOW (ref 4.0–10.5)

## 2017-04-27 LAB — COMPREHENSIVE METABOLIC PANEL
ALT: 11 U/L — ABNORMAL LOW (ref 14–54)
AST: 14 U/L — ABNORMAL LOW (ref 15–41)
Albumin: 3.6 g/dL (ref 3.5–5.0)
Alkaline Phosphatase: 43 U/L (ref 38–126)
Anion gap: 6 (ref 5–15)
BUN: 6 mg/dL (ref 6–20)
CO2: 22 mmol/L (ref 22–32)
Calcium: 8.4 mg/dL — ABNORMAL LOW (ref 8.9–10.3)
Chloride: 114 mmol/L — ABNORMAL HIGH (ref 101–111)
Creatinine, Ser: 0.68 mg/dL (ref 0.44–1.00)
GFR calc Af Amer: >60 mL/min (ref >60)
GFR calc non Af Amer: >60 mL/min (ref >60)
Glucose, Bld: 83 mg/dL (ref 65–99)
Potassium: 3.7 mmol/L (ref 3.5–5.1)
Sodium: 142 mmol/L (ref 135–145)
Total Bilirubin: 0.4 mg/dL (ref 0.3–1.2)
Total Protein: 5.5 g/dL — ABNORMAL LOW (ref 6.5–8.1)

## 2017-04-27 LAB — LITHIUM LEVEL: Lithium Lvl: 0.41 mmol/L — ABNORMAL LOW (ref 0.60–1.20)

## 2017-04-27 NOTE — Clinical Social Work Psych Note (Signed)
Clinical Social Worker Psych Service Line Progress Note  Clinical Social Worker: Lia Hopping, LCSW Date/Time: 04/27/2017, 3:16 PM   Review of Patient  Overall Medical Condition:  Medically Stable for [facility] placement.   Participation Level:  Active Participation Quality: Appropriate Other Participation Quality:  Calm, Cooperative  Affect: Appropriate (Tearful) Cognitive: Appropriate Reaction to Medications/Concerns: No concerns presented about her medication.   Modes of Intervention: Support, Exploration   Summary of Progress/Plan at Discharge  Summary of Progress/Plan at Discharge: CSW met with patient at bedside, explain role and reason for csw intervention. CSW inquired about patient wellbeing. She reports, " I am feeling okay, I know I have to go next door for treatment. I have not been there since 2011 for detox." Patient reports she has not misused  Benzodiazapine's  in five years and has not drank alcohol "in years." Patient denies she overdosed on her lithium  medications, " I do not know why my lithium levels are so high." CSW informed patient that once [facility] bed comes available she will be informed. Patient reports she was told she was leaving today and become tearful. CSW provided emotional support and encouragement.   Patient reports started seeing psychiatrist at [facility] about two weeks ago and enjoyed her session. Patient described her support system, two sons and grandson. They live out of town but are involved in her care. She became very tearful and express " I feel sorry my boys have to see me go through this, the moving and my changes." Patient was very appreciative of CSW visit and updates. CSW will continue to follow and assist with patient disposition.   CSW spoke with AC[facility] at [facility], this a.m. She will update CSW when bed is available.   Kathrin Greathouse, Latanya Presser, MSW Clinical Social Worker 5E and Psychiatric Service Line [phone] 04/27/2017  3:40  PM

## 2017-04-27 NOTE — Care Management Note (Signed)
Case Management Note  Patient Details  Name: [name] MRN: [id number] Date of Birth: 12/19/1960  Subjective/Objective:                    Action/Plan:d/c IP Psych.   Expected Discharge Date:  04/27/17               Expected Discharge Plan:  [facility]  In-House Referral:  Clinical Social Work  Discharge planning Services  CM Consult  Post Acute Care Choice:    Choice offered to:     DME Arranged:    DME Agency:     HH Arranged:    [place] Agency:     Status of Service:  Completed, signed off  If discussed at [employer], dates discussed:    Additional Comments:  Dessa Phi, RN 04/27/2017, 11:51 AM

## 2017-04-27 NOTE — Discharge Summary (Signed)
Physician Discharge Summary  [name] TMH:[id number] DOB: Aug 28, 1961 DOA: 04/25/2017  PCP: Arnoldo Morale, MD  Admit date: 04/25/2017 Discharge date: 04/27/2017  Admitted From: Home Disposition:  Inpatient psychiatric facility  Recommendations for Outpatient Follow-up:  1. Follow up with PCP in 1-2 weeks after discharge from inpatient psychiatric facility 2. Follow up with psychiatrist at inpatient psychiatric facility at earliest convenience. Medications for patient's psychiatric condition to be adjusted by psychiatrist. 3. Follow up with oncologist as scheduled  Home Health: No Equipment/Devices: None  Discharge Condition: Guarded CODE STATUS: Full Diet recommendation: Heart Healthy   Brief/Interim Summary: 56 y.o.femalewith history of metastatic breast cancer, bipolar disorder, alcohol abuse in remission, anemia presented to the ER because of weakness and tremors. Patient was recently admitted for confusion and tremors and at that time patient's lithium was discontinued. On follow-up with psychiatrist patient lithium was again started back. Patient originally presented to [facility] yesterday with complaints of weakness and falls. Lithium levels at that time was 1.6. Patient left AGAINST MEDICAL ADVICE and came to [facility] long hospital. Poison control was consulted and recommended IV hydration and recheck lithium levels a.m. Patient had remained confused but has been threatening to sign out [place]. Patient was involuntarily committed. Psychiatry has evaluated the patient and recommended hospitalization to inpatient psychiatric facility. Lithium level is normalized.  Discharge Diagnoses:  Principal Problem:   Bipolar I disorder, most recent episode depressed ([place]) Active Problems:   Metastatic breast cancer ([place])   Lithium overdose  1. Elevated lithium level/lithium overdose -  -   status post IV fluid hydration as per prior discussion with poison control.  lithium has normalized. Lithium is on hold. Psychiatry has evaluated the patient and placed on various medications for her current psychiatric condition.   2. Confusion and intermittent agitation in a patient with history of bipolar disorder:  -  Patient was involuntarily committed because of confusion, intermittent agitation. Psychiatric evaluation appreciated. Transfer to inpatient psychiatric facility and medications for her psychotic condition will have to be adjusted by the psychiatrist.  2. Metastatic brain cancer:outpatient follow-up by Dr. [name]  3. Iron deficiency anemia - receives iron replacement at oncology office. 4. History of alcohol abuse in remission.  Discharge Instructions  Discharge Instructions    Call MD for:  extreme fatigue    Complete by:  As directed    Call MD for:  hives    Complete by:  As directed    Call MD for:  persistant dizziness or light-headedness    Complete by:  As directed    Call MD for:  persistant nausea and vomiting    Complete by:  As directed    Call MD for:  severe uncontrolled pain    Complete by:  As directed    Call MD for:  temperature >100.4    Complete by:  As directed    Diet - low sodium heart healthy    Complete by:  As directed    Discharge instructions    Complete by:  As directed    Psychiatric medications to be adjusted by Psychiatrist at the inpatient psychiatric facility   Increase activity slowly    Complete by:  As directed      Allergies as of 04/27/2017      Reactions   Demerol Itching, Nausea And Vomiting   Erythromycin Rash      Medication List    STOP taking these medications   lamoTRIgine 200 MG tablet Commonly known as:  LAMICTAL   lithium 300 MG tablet   nicotine 7 mg/24hr patch Commonly known as:  NICODERM CQ - dosed in mg/24 hr   Oxcarbazepine 300 MG tablet Commonly known as:  TRILEPTAL   potassium chloride 10 MEQ CR capsule Commonly known as:  MICRO-K   QUEtiapine 50 MG  tablet Commonly known as:  SEROQUEL   traMADol 50 MG tablet Commonly known as:  ULTRAM   traZODone 100 MG tablet Commonly known as:  DESYREL     TAKE these medications   anastrozole 1 MG tablet Commonly known as:  ARIMIDEX Take 1 tablet (1 mg total) by mouth daily.   Biotin 1 MG Caps Take 1 mg by mouth daily.   cholecalciferol 1000 units tablet Commonly known as:  VITAMIN D Take 1,000 Units by mouth daily.   clonazePAM 0.5 MG tablet Commonly known as:  KLONOPIN Take 1 tablet (0.5 mg total) by mouth daily as needed for anxiety.   gabapentin 300 MG capsule Commonly known as:  NEURONTIN Take 1 capsule (300 mg total) by mouth 2 (two) times daily.   ibuprofen 800 MG tablet Commonly known as:  ADVIL,MOTRIN Take 800 mg by mouth 3 (three) times daily.   loratadine 10 MG tablet Commonly known as:  CLARITIN Take 1 tablet (10 mg total) by mouth daily.   pantoprazole 40 MG tablet Commonly known as:  PROTONIX Take 40 mg by mouth daily.      Follow-up Information    Arnoldo Morale, MD Follow up in 1 week(s).   Specialty:  Family Medicine Contact information: [address] [place] [address] [phone]          Allergies  Allergen Reactions  . Demerol Itching and Nausea And Vomiting  . Erythromycin Rash    Consultations: Psychiatry  Procedures/Studies:  none   Subjective:  patient seen and examined at bedside. She is adamant that she wants to leave. No overnight fever, vomiting reported  Discharge Exam: Vitals:   04/26/17 2046 04/27/17 0546  BP: 124/63 132/69  Pulse: 87 86  Resp: 15 16  Temp: 98.7 F (37.1 C) 98.6 F (37 C)   Vitals:   04/26/17 0400 04/26/17 1337 04/26/17 2046 04/27/17 0546  BP: (!) 151/95 119/61 124/63 132/69  Pulse: 90 87 87 86  Resp: 20 12 15 16   Temp: 98.5 F (36.9 C) 97.9 F (36.6 C) 98.7 F (37.1 C) 98.6 F (37 C)  TempSrc: Oral Oral Oral Oral  SpO2: 94% 100% 100% 100%  Weight:      Height:         General: Pt is awake, Is adamant that she wants to leave  Cardiovascular:  rate controlled, S1-S2 positive Respiratory: Bilateral decreased breath sounds bases Abdominal: Soft, NT, ND, bowel sounds + Extremities: no edema, no cyanosis    The results of significant diagnostics from this hospitalization (including imaging, microbiology, ancillary and laboratory) are listed below for reference.     Microbiology: No results found for this or any previous visit (from the past 240 hour(s)).   Labs: BNP (last 3 results) No results for input(s): BNP in the last 8760 hours. Basic Metabolic Panel:  Recent Labs Lab 04/25/17 1051 04/25/17 2139 04/26/17 0536 04/27/17 0547  NA 136 140 141 142  K 4.5 3.7 3.9 3.7  CL 104 107 111 114*  CO2 24 26 24 22   GLUCOSE 80 86 80 83  BUN 13 12 10 6   CREATININE 0.90 0.90 0.78 0.68  CALCIUM 9.6 9.6 8.9  8.4*  MG  --   --  1.8  --    Liver Function Tests:  Recent Labs Lab 04/25/17 1051 04/25/17 2139 04/26/17 0536 04/27/17 0547  AST 15 13* 16 14*  ALT 12* 8* 10* 11*  ALKPHOS 47 48 46 43  BILITOT 0.3 0.4 0.5 0.4  PROT 6.9 6.7 5.8* 5.5*  ALBUMIN 4.4 4.3 3.8 3.6   No results for input(s): LIPASE, AMYLASE in the last 168 hours. No results for input(s): AMMONIA in the last 168 hours. CBC:  Recent Labs Lab 04/25/17 1051 04/25/17 2139 04/26/17 0536 04/27/17 0547  WBC 4.3 5.8 4.6 3.7*  NEUTROABS 3.2 4.2  --  2.3  HGB 13.6 11.5* 10.6* 10.7*  HCT 42.0 35.5* 32.3* 31.5*  MCV 96.1 95.2 95.0 93.8  PLT 174 197 157 154   Cardiac Enzymes: No results for input(s): CKTOTAL, CKMB, CKMBINDEX, TROPONINI in the last 168 hours. BNP: Invalid input(s): POCBNP CBG: No results for input(s): GLUCAP in the last 168 hours. D-Dimer No results for input(s): DDIMER in the last 72 hours. Hgb A1c No results for input(s): HGBA1C in the last 72 hours. Lipid Profile No results for input(s): CHOL, HDL, LDLCALC, TRIG, CHOLHDL, LDLDIRECT in the last 72  hours. Thyroid function studies  Recent Labs  04/26/17 0536  TSH 1.475   Anemia work up No results for input(s): VITAMINB12, FOLATE, FERRITIN, TIBC, IRON, RETICCTPCT in the last 72 hours. Urinalysis    Component Value Date/Time   COLORURINE YELLOW 04/25/2017 2206   APPEARANCEUR CLEAR 04/25/2017 2206   LABSPEC 1.012 04/25/2017 2206   PHURINE 5.0 04/25/2017 2206   GLUCOSEU NEGATIVE 04/25/2017 2206   HGBUR SMALL (A) 04/25/2017 2206   BILIRUBINUR NEGATIVE 04/25/2017 2206   BILIRUBINUR negative 03/28/2015 1321   KETONESUR 20 (A) 04/25/2017 2206   PROTEINUR NEGATIVE 04/25/2017 2206   UROBILINOGEN 0.2 03/28/2015 1321   UROBILINOGEN 0.2 04/18/2013 1222   NITRITE NEGATIVE 04/25/2017 2206   LEUKOCYTESUR MODERATE (A) 04/25/2017 2206   Sepsis Labs Invalid input(s): PROCALCITONIN,  WBC,  LACTICIDVEN Microbiology No results found for this or any previous visit (from the past 240 hour(s)).   Time coordinating discharge: 35 minutes  SIGNED:   Aline August, MD  [facility] 04/27/2017, 10:42 AM Pager: [phone]  If 7PM-7AM, please contact night-coverage [email or web address] Password [email or web address]

## 2017-04-28 DIAGNOSIS — C7940 Secondary malignant neoplasm of unspecified part of nervous system: Secondary | ICD-10-CM

## 2017-04-28 DIAGNOSIS — R251 Tremor, unspecified: Secondary | ICD-10-CM

## 2017-04-28 DIAGNOSIS — Z170 Estrogen receptor positive status [ER+]: Secondary | ICD-10-CM

## 2017-04-28 DIAGNOSIS — C50312 Malignant neoplasm of lower-inner quadrant of left female breast: Secondary | ICD-10-CM

## 2017-04-28 DIAGNOSIS — R410 Disorientation, unspecified: Secondary | ICD-10-CM

## 2017-04-28 DIAGNOSIS — C7951 Secondary malignant neoplasm of bone: Secondary | ICD-10-CM

## 2017-04-28 DIAGNOSIS — F319 Bipolar disorder, unspecified: Principal | ICD-10-CM

## 2017-04-28 DIAGNOSIS — Z79811 Long term (current) use of aromatase inhibitors: Secondary | ICD-10-CM

## 2017-04-28 MED ORDER — POTASSIUM CHLORIDE CRYS ER 10 MEQ PO TBCR
10.0000 meq | EXTENDED_RELEASE_TABLET | Freq: Every day | ORAL | Status: DC
Start: 2017-04-28 — End: 2017-04-30
  Administered 2017-04-28 – 2017-04-30 (×3): 10 meq via ORAL
  Filled 2017-04-28 (×3): qty 1

## 2017-04-28 MED ORDER — LORATADINE 10 MG PO TABS
10.0000 mg | ORAL_TABLET | Freq: Every day | ORAL | Status: DC
  Administered 2017-04-28 – 2017-04-30 (×3): 10 mg via ORAL
  Filled 2017-04-28 (×3): qty 1

## 2017-04-28 MED ORDER — FLUTICASONE PROPIONATE 50 MCG/ACT NA SUSP
2.0000 | Freq: Every day | NASAL | Status: DC
  Administered 2017-04-29 – 2017-04-30 (×2): 2 via NASAL
  Filled 2017-04-28: qty 16

## 2017-04-28 MED ORDER — ANASTROZOLE 1 MG PO TABS
1.0000 mg | ORAL_TABLET | Freq: Every day | ORAL | Status: DC
  Administered 2017-04-28 – 2017-04-30 (×3): 1 mg via ORAL
  Filled 2017-04-28 (×3): qty 1

## 2017-04-28 MED ORDER — PANTOPRAZOLE SODIUM 40 MG PO TBEC
40.0000 mg | DELAYED_RELEASE_TABLET | Freq: Every day | ORAL | Status: DC
  Administered 2017-04-28 – 2017-04-30 (×3): 40 mg via ORAL
  Filled 2017-04-28 (×3): qty 1

## 2017-04-28 NOTE — Progress Notes (Signed)
Medically cleared , Await for psych placement.

## 2017-04-28 NOTE — Evaluation (Signed)
Physical Therapy One Time Evaluation Patient Details Name: [name] MRN: [id number] DOB: 1961-03-27 Today's Date: 04/28/2017   History of Present Illness  "56 y.o. female with history of metastatic breast cancer, bipolar disorder, alcohol abuse in remission, anemia presented to the ER because of weakness and tremors. Patient was recently admitted for confusion and tremors and at that time patient's lithium was discontinued. On follow-up with psychiatrist patient lithium was again started back. Patient originally presented to [facility] yesterday with complaints of weakness and falls. Lithium levels at that time was 1.6. Patient left AGAINST MEDICAL ADVICE and came to [facility] long hospital.  Poison control was consulted and recommended IV hydration and recheck lithium levels a.m. Patient had remained confused but has been threatening to sign out [place]. Patient was involuntarily committed. Psychiatry has evaluated the patient and recommended hospitalization to inpatient psychiatric facility. Lithium level is normalized."  Clinical Impression  Patient evaluated by Physical Therapy with no further acute PT needs identified. All education has been completed and the patient has no further questions.  Pt presents with high level balance deficits and states her balance has been off for approx 5-6 months.  Pt plans to d/c to inpatient psych facility however eager to receive f/u for outpatient PT once medically stable.  Pt performed a couple seated exercises to perform until f/u (stressed no other exercises or balance activities for safety as she should perform more challenging activities with assist available/PT follow up). See below for any follow-up Physical Therapy or equipment needs. PT is signing off. Thank you for this referral.     Follow Up Recommendations Outpatient PT (plan is for d/c to [facility], recommend f/u outpatient neurorehab PT )    Equipment Recommendations  None  recommended by PT    Recommendations for Other Services       Precautions / Restrictions Precautions Precautions: Fall      Mobility  Bed Mobility Overal bed mobility: Modified Independent                Transfers Overall transfer level: Needs assistance   Transfers: Sit to/from Stand Sit to Stand: Supervision         General transfer comment: supervision for safety  Ambulation/Gait Ambulation/Gait assistance: Min guard;Supervision Ambulation Distance (Feet): 400 Feet Assistive device: None Gait Pattern/deviations: Step-through pattern;Drifts right/left     General Gait Details: tends to drift however steady without balance challenges, unsteady with challenges to balance min/guard for safety, no overt LOB however  Stairs            Wheelchair Mobility    Modified Rankin (Stroke Patients Only)       Balance Overall balance assessment: Needs assistance         Standing balance support: No upper extremity supported Standing balance-Leahy Scale: Fair Standing balance comment: static standing mostly supervision Single Leg Stance - Right Leg: 20 Single Leg Stance - Left Leg: 5         High level balance activites: Backward walking;Sudden stops;Head turns High Level Balance Comments: difficulty maintaining balance with high level balance activities (03/29/17 BERG score documentated as 29 indicating high risk for falls)             Pertinent Vitals/Pain Pain Assessment: No/denies pain    Home Living Family/patient expects to be discharged to:: Private residence Living Arrangements: Non-relatives/Friends     Home Access: Stairs to enter   Technical brewer of Steps: 3   Home Equipment: Environmental consultant - 2  wheels;Cane - single point Additional Comments: plan for d/c to [facility] (inpatient psych)    Prior Function Level of Independence: Independent               Hand Dominance        Extremity/Trunk Assessment        Lower  Extremity Assessment Lower Extremity Assessment: Generalized weakness       Communication   Communication: No difficulties  Cognition Arousal/Alertness: Awake/alert Behavior During Therapy: WFL for tasks assessed/performed Overall Cognitive Status: No family/caregiver present to determine baseline cognitive functioning                       Memory: Decreased short-term memory   Safety/Judgement: Decreased awareness of deficits   Problem Solving: Slow processing;Requires verbal cues        General Comments      Exercises General Exercises - Lower Extremity Long Arc Quad: AROM;Both;5 reps;Seated Hip ABduction/ADduction: AROM;Seated;Both;5 reps Straight Leg Raises: AROM;Both;5 reps;Seated Hip Flexion/Marching: AROM;Both;5 reps;Seated   Assessment/Plan    PT Assessment    PT Problem List         PT Treatment Interventions      PT Goals (Current goals can be found in the Care Plan section)  Acute Rehab PT Goals PT Goal Formulation: All assessment and education complete, DC therapy    Frequency     Barriers to discharge        Co-evaluation               AM-PAC PT "6 Clicks" Daily Activity  Outcome Measure Difficulty turning over in bed (including adjusting bedclothes, sheets and blankets)?: None Difficulty moving from lying on back to sitting on the side of the bed? : None Difficulty sitting down on and standing up from a chair with arms (e.g., wheelchair, bedside commode, etc,.)?: None Help needed moving to and from a bed to chair (including a wheelchair)?: None Help needed walking in hospital room?: A Little Help needed climbing 3-5 steps with a railing? : A Little 6 Click Score: 22    End of Session Equipment Utilized During Treatment: Gait belt Activity Tolerance: Patient tolerated treatment well Patient left: in bed;with call bell/phone within reach;with nursing/sitter in room Nurse Communication: Mobility status PT Visit Diagnosis:  Unsteadiness on feet (R26.81)    Time: 8616-8372 PT Time Calculation (min) (ACUTE ONLY): 14 min   Charges:   PT Evaluation $PT Eval Low Complexity: 1 Procedure     PT G CodesCarmelia Bake, PT, DPT 04/28/2017 Pager: [phone]   York Ram E 04/28/2017, 2:45 PM

## 2017-04-28 NOTE — Progress Notes (Signed)
CSW assisting with patient discharge to inpatient psychiatric placement. CSW discussed patient with AC-Tina. Patient on list for placement.  CSW will inform medical staff when bed is available.   Kathrin Greathouse, Latanya Presser, MSW Clinical Social Worker 5E and Psychiatric Service Line [phone] 04/28/2017  10:13 AM

## 2017-04-28 NOTE — Progress Notes (Signed)
[name]   DOB:05-22-1961   PO#:[id number]   ERX#:[id number]  Subjective: [name] was tearful today. She was unable to give me an account of how mer medications changed, who changed them, or why she was back on lithium. She felt she was taking her medicines correctly. She cannot identify a psychiatrist she sees outpatient. She says the problem was "stress." She "wants to go home" but asked where "home" is she says it is [place] where now both her sons live. However she has no plans to move there. Sitter in room   Objective: middle aged White woman examined sitting up in bed Vitals:   04/27/17 2111 04/28/17 0626  BP: 137/69 129/75  Pulse: 69 89  Resp: 16 16  Temp: 98.4 F (36.9 C) 98.4 F (36.9 C)    Body mass index is 20.39 kg/m.  Intake/Output Summary (Last 24 hours) at 04/28/17 0753 Last data filed at 04/28/17 0738  Gross per 24 hour  Intake             1350 ml  Output                0 ml  Net             1350 ml     No cervical or supraclavicular adenopathy  Lungs no rales or wheezes  Heart regular rate and rhythm  Abdomen soft, +BS  Neuro nonfocal, well-oriented, labile affect  Breast exam: deferred  CBG (last 3)  No results for input(s): GLUCAP in the last 72 hours.   Labs:  Lab Results  Component Value Date   WBC 3.7 (L) 04/27/2017   HGB 10.7 (L) 04/27/2017   HCT 31.5 (L) 04/27/2017   MCV 93.8 04/27/2017   PLT 154 04/27/2017   NEUTROABS 2.3 04/27/2017    @LASTCHEMISTRY @  Urine Studies No results for input(s): UHGB, CRYS in the last 72 hours.  Invalid input(s): UACOL, UAPR, USPG, UPH, UTP, UGL, UKET, UBIL, UNIT, UROB, ULEU, UEPI, UWBC, URBC, UBAC, CAST, [place], [place]  Basic Metabolic Panel:  Recent Labs Lab 04/25/17 1051 04/25/17 2139 04/26/17 0536 04/27/17 0547  NA 136 140 141 142  K 4.5 3.7 3.9 3.7  CL 104 107 111 114*  CO2 24 26 24 22   GLUCOSE 80 86 80 83  BUN 13 12 10 6   CREATININE 0.90 0.90 0.78 0.68  CALCIUM 9.6 9.6 8.9 8.4*  MG  --   --   1.8  --    GFR Estimated Creatinine Clearance: 68.9 mL/min (by C-G formula based on SCr of 0.68 mg/dL). Liver Function Tests:  Recent Labs Lab 04/25/17 1051 04/25/17 2139 04/26/17 0536 04/27/17 0547  AST 15 13* 16 14*  ALT 12* 8* 10* 11*  ALKPHOS 47 48 46 43  BILITOT 0.3 0.4 0.5 0.4  PROT 6.9 6.7 5.8* 5.5*  ALBUMIN 4.4 4.3 3.8 3.6   No results for input(s): LIPASE, AMYLASE in the last 168 hours. No results for input(s): AMMONIA in the last 168 hours. Coagulation profile No results for input(s): INR, PROTIME in the last 168 hours.  CBC:  Recent Labs Lab 04/25/17 1051 04/25/17 2139 04/26/17 0536 04/27/17 0547  WBC 4.3 5.8 4.6 3.7*  NEUTROABS 3.2 4.2  --  2.3  HGB 13.6 11.5* 10.6* 10.7*  HCT 42.0 35.5* 32.3* 31.5*  MCV 96.1 95.2 95.0 93.8  PLT 174 197 157 154   Cardiac Enzymes: No results for input(s): CKTOTAL, CKMB, CKMBINDEX, TROPONINI in the last 168 hours. BNP: Invalid input(s):  POCBNP CBG: No results for input(s): GLUCAP in the last 168 hours. D-Dimer No results for input(s): DDIMER in the last 72 hours. Hgb A1c No results for input(s): HGBA1C in the last 72 hours. Lipid Profile No results for input(s): CHOL, HDL, LDLCALC, TRIG, CHOLHDL, LDLDIRECT in the last 72 hours. Thyroid function studies  Recent Labs  04/26/17 0536  TSH 1.475   Anemia work up No results for input(s): VITAMINB12, FOLATE, FERRITIN, TIBC, IRON, RETICCTPCT in the last 72 hours. Microbiology No results found for this or any previous visit (from the past 240 hour(s)).    Studies:  No results found.  Assessment: 56 y.o. 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  (2) status post left mastectomy and axillary lymph node dissection  with immediate expander placement 07/18/2015 for an mpT4  pN2,stage IIIB invasive ductal carcinoma, grade 3, with negative margins.             (a) definitive implant exchange to be scheduled in December            METASTATIC DISEASE: October 2016  (3) CT scan of the chest abdomen and pelvis  09/16/2015 shows metastatic lesions in the right scapula, left iliac crest, L4, and T spine. There were questionable liver cysts, with repeat CT scan 03/02/2016 showing possible right upper lobe lung lesions and possibly increased liver lesions             (a) CT scan of the chest 06/17/2016 shows no active disease in the lungs or liver             (b) Bone scan July 2017 showed no evidence of bony metastatic disease              (c) head CT 07/08/2016 showed a cerebellar lesion, confirmed by MRI 07/23/2016, status post craniotomy 08/14/2016, confirming a metastatic deposit which was estrogen and progesterone receptor negative, HER-2 amplified with a signals ratio of 7.16, number per cell 13.25             (d) CA 27-29 not informative  (4) received docetaxel every 3 weeks 6 together with trastuzumab and pertuzumab, last docetaxel dose 02/11/2016  (5) adjuvant radiation7/03/2016 to 06/26/2016 at [place]: 1. The Left chest wall was treated to 23.4 Gy in 13 fractions at 1.8 Gy per fraction. 2. The Left chest wall was boosted to 10 Gy in 5 fractions at 2 Gy per fraction. 3. The Left Sclav/PAB was treated to 23.4 Gy in 13 fractions at 1.8 Gy per fraction.  Including the patient's treatment in [place] (received 15 fractions per Dr. Maryan Rued near [place], [place]), the patient received 50.4 Gy to the left chest wall and supraclavicular region.   (6) started trastuzumab and pertuzumab October 2016, continuing every 3 weeks,             (a) echocardiogram 02/26/2016 showed a well preserved ejection fraction             (b) echocardiogram 07/01/2016 shows an ejection fraction in the 60-65%              (c) pertuzumab discontinued 08/25/2016 with uncontrolled  diarrhea             (d) echocardiogram 11/11/2016 showed an ejection fraction in the 60-65%             (  e) echocardiogram 03/03/2017 shows an ejection fraction of 60-65%               (7) started denosumab/Xgeva October 2017, repeated every 6 weeks  (8) started anastrozole October 2017              (a) bone scan 11/10/2016 shows no active disease             (b) chest CT scan 11/10/2016 stable, with no evidence of active disease  (9) history of bipolar disorder: no psychiatric outpatient follow-up in place (?)            (10) mild anemia with a significant drop in the MCV, ferritin 10 06/03/2016,              (a) Feraheme given 06/12/2016 and 06/18/2016  (11) tobacco abuse: Chantix started 06/18/2016, discontinued at some time by patient   (12) 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             (a) brain irradiation10/19/17-11/08/17:Whole brain/ 35 Gy in 14 fractions              (b) repeat brain MRI obtained 12/10/2016 shows no active disease in the brain             (c) repeat brain MRI 03/02/2017 shows no evidence of residual or recurrent disease   Plan:  Yasmene does very well when she takes her medicine as prescribed. It is unclear to her why she went back on lithium--it had been stopped at her most recent hospitalization and when I saw her 05/15 off that medication she had been psychiatrically doing very well.  Ideally she would be in an assisted living situation where someone gave her her meds.  She has no PCP.  From a cancer point of view she is doing well and she will see [place] again 05/11/2017 at 08.45 AM for her next Herceptin treatment.  She needs to stay on anastrozole indefinitely-- order written.  Appreciate your help to this complex patient!     Chauncey Cruel, MD 04/28/2017  7:53 AM Medical Oncology and Hematology [facility] [address] [place], [place] [address] Tel. [phone]     Fax. [phone]

## 2017-04-29 NOTE — Progress Notes (Signed)
Ambulating, no new complaints, sitter in room, awaiting for psych placement.

## 2017-04-29 NOTE — Progress Notes (Signed)
Patient medically stable for discharge. CSW spoke with AC[facility]Tina at [facility] about bed placement.  CSW will inform medical staff when bed is available.  Kathrin Greathouse, Latanya Presser, MSW Clinical Social Worker 5E and Psychiatric Service Line [phone] 04/29/2017  9:41 AM

## 2017-04-30 DIAGNOSIS — T56894A Toxic effect of other metals, undetermined, initial encounter: Secondary | ICD-10-CM

## 2017-04-30 MED ORDER — LAMOTRIGINE 25 MG PO TABS: 25.0000 mg | ORAL_TABLET | Freq: Two times a day (BID) | ORAL | 0 refills | Status: DC

## 2017-04-30 MED ORDER — HYDROXYZINE HCL 50 MG PO TABS: 50.0000 mg | ORAL_TABLET | Freq: Every evening | ORAL | 0 refills | Status: DC | PRN

## 2017-04-30 MED ORDER — LURASIDONE HCL 40 MG PO TABS: 40.0000 mg | ORAL_TABLET | Freq: Every day | ORAL | 0 refills | Status: DC

## 2017-04-30 MED ORDER — FLUTICASONE PROPIONATE 50 MCG/ACT NA SUSP: 2.0000 | Freq: Every day | NASAL | 0 refills | Status: DC

## 2017-04-30 MED ORDER — TRAZODONE HCL 100 MG PO TABS: 100.0000 mg | ORAL_TABLET | Freq: Every day | ORAL | 0 refills | Status: AC

## 2017-04-30 NOTE — Progress Notes (Signed)
CSW and psychiatrist met with patient. Patient alert and oriented and reports "feeling much better." Patient reports her medications have been managed and changed and feels it has helped. Patient denies feeling suicidal or homicidal. Patient well educated about current medications and has requested psychiatrist give her "someting to help her sleep." Patient has plan to call make appointment and follow up with her Dr. Darleene Cleaver, psychiatrist for outpatient at the [facility] center. Patient reports she plans to follow up with cancer center social worker whom she familiar and join support group "I have been doing things for myself not really asking for help and this is what happen to me." CSW and RNCM provided emotional support and encouragement.  CSW rescinded IVC.  CSW called social workers at [employer] to discuss patient support.   Kathrin Greathouse, Latanya Presser, MSW Clinical Social Worker 5E and Psychiatric Service Line [phone] 04/30/2017  1:31 PM

## 2017-04-30 NOTE — Progress Notes (Signed)
Pt given discharge teaching and medication instructions. Pt verbalized Understanding all of discharge teaching and medication instructions. Pt's home medications returned to Pt and counted with Pt. Discharge Packet with prescriptions with Pt at time of discharge.

## 2017-04-30 NOTE — Care Management Note (Signed)
Case Management Note  Patient Details  Name: [name] MRN: [id number] Date of Birth: 10/18/1961  Subjective/Objective:  Psych cleared for home-Patient for d/c home w/HHC-HHRN-meds, safety eval, Education officer, museum. [facility] rep Kim aware of d/c & HHC. Patient lives in [place] other people live there. No further CM needs.                  Action/Plan:d/c home.   Expected Discharge Date:  04/27/17               Expected Discharge Plan:  Home/Self Care  In-House Referral:  Clinical Social Work  Discharge planning Services  CM Consult  Post Acute Care Choice:    Choice offered to:  Patient  DME Arranged:    DME Agency:     HH Arranged:  RN, Social Work [employer] Agency:  [place]  Status of Service:  Completed, signed off  If discussed at [employer], dates discussed:    Additional Comments:  Dessa Phi, RN 04/30/2017, 1:35 PM

## 2017-04-30 NOTE — Progress Notes (Signed)
CSW dicussed patient with  [facility] , patient is on wait list.  CSW confirmed with physician patient is ambulating well in the hallway. CSW will inform medical staff when bed is available.   Kathrin Greathouse, Latanya Presser, MSW Clinical Social Worker 5E and Psychiatric Service Line [phone] 04/30/2017  10:51 AM

## 2017-04-30 NOTE — Discharge Summary (Signed)
Discharge Summary  [name] UEA:[id number] DOB: 1960/12/01  PCP: Arnoldo Morale, MD  Admit date: 04/25/2017 Discharge date: 04/30/2017  Time spent: >61mins, more than 50% time spent on coordination of care  Recommendations for Outpatient Follow-up:  1. F/u with PMD Dr Jarold Song within a week  for hospital discharge follow up, repeat cbc/bmp at follow up 2. F/u with psychiatry Dr Darleene Cleaver 3. Home health arranged with RN and Education officer, museum, outpatient physical therapy referral made  Discharge Diagnoses:  Active Hospital Problems   Diagnosis Date Noted  . Bipolar I disorder, most recent episode depressed ([place]) 12/08/2016  . Lithium overdose 04/26/2017  . Metastatic breast cancer [facility])     Resolved Hospital Problems   Diagnosis Date Noted Date Resolved  No resolved problems to display.    Discharge Condition: stable  Diet recommendation: regular diet  Filed Weights   04/28/17 0626 04/29/17 0619 04/30/17 0500  Weight: 55.6 kg (122 lb 8 oz) 55.2 kg (121 lb 9.6 oz) 56.2 kg (123 lb 14.4 oz)    History of present illness:  PCP: Arnoldo Morale, MD  Patient coming from: Home.  Chief Complaint: Tremors and weakness.  HPI: [name] is a 56 y.o. female with history of metastatic breast cancer, bipolar disorder, alcohol abuse in remission, anemia presents to the ER because of weakness and tremors. Patient was recently admitted for confusion and tremors and at that time patient's lithium was discontinued. On follow-up with psychiatrist patient lithium was again started back. Patient originally presented to [facility] yesterday with complaints of weakness and falls. Lithium levels at that time was 1.6. Patient left AGAINST MEDICAL ADVICE and came to [facility].   ED Course: Patient was complaining of weakness and tremors and on exam patient has generalized tremors. Lithium levels O year was 1.2. Sodium and creatinine were normal. Patient was given 1 L normal saline  bolus and admitted for further observation. On exam patient appears mildly confused but is oriented to time place and person. Moves all extremities. Patient states she had taken lithium as advised and not overdosed. Though her in the earlier visit yesterday she said that she may have taken extra dose without knowledge.  Hospital Course:  Principal Problem:   Bipolar I disorder, most recent episode depressed ([place]) Active Problems:   Metastatic breast cancer ([place])   Lithium overdose  Elevated lithium level/lithium overdose -  -  status post IV fluid hydration as per prior discussion with poison control. lithium has normalized. Lithium discontinued.   Psychiatry has evaluated the patient , psych meds adjusted  "Lamictal 25 mg bid for mood -Latuda 40 mg qhs for psychosis. -Gabapentin 300 mg bid for aggression/anxiety -Trazodone 100 mg qhs for sleep."  Confusion and intermittent agitation in a patient with history of bipolar disorder:  -  Patient wasinvoluntarily committed because of confusion, intermittent agitation. Psychiatric evaluation initially on 5/28 recommended inpatient psych placement,  reeval by psych on 6/1 cleared patient to discharge home with outpatient psych follow up.   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 Continue Arimidex and Herceptin. Patient was seen by Dr. [name] in the hospital and will follow up in 1 week.    Patient report some unsteadiness since after brain surgery for tumor resection, physical therapy evaluated patient and recommend outpatient PT, referral made.  Iron deficiency anemia - receives iron replacement at oncology office.  History of  alcohol abuse in remission.  Procedures:  none  Consultations:  Psychiatry  oncology  Discharge Exam: BP 109/76 (BP Location: Right Arm)   Pulse 84   Temp  98.1 F (36.7 C) (Oral)   Resp 16   Ht 5\' 5"  (1.651 m)   Wt 56.2 kg (123 lb 14.4 oz)   SpO2 98%   BMI 20.62 kg/m   General: NAD, ambulating Cardiovascular: RRR Respiratory: CTABL  Discharge Instructions You were cared for by a hospitalist during your hospital stay. If you have any questions about your discharge medications or the care you received while you were in the hospital after you are discharged, you can call the unit and asked to speak with the hospitalist on call if the hospitalist that took care of you is not available. Once you are discharged, your primary care physician will handle any further medical issues. Please note that NO REFILLS for any discharge medications will be authorized once you are discharged, as it is imperative that you return to your primary care physician (or establish a relationship with a primary care physician if you do not have one) for your aftercare needs so that they can reassess your need for medications and monitor your lab values.  Discharge Instructions    Ambulatory referral to Physical Therapy    Complete by:  As directed    Call MD for:  extreme fatigue    Complete by:  As directed    Call MD for:  hives    Complete by:  As directed    Call MD for:  persistant dizziness or light-headedness    Complete by:  As directed    Call MD for:  persistant nausea and vomiting    Complete by:  As directed    Call MD for:  severe uncontrolled pain    Complete by:  As directed    Call MD for:  temperature >100.4    Complete by:  As directed    Diet - low sodium heart healthy    Complete by:  As directed    Diet general    Complete by:  As directed    Discharge instructions    Complete by:  As directed    Psychiatric medications to be adjusted by Psychiatrist at the inpatient psychiatric facility   Face-to-face encounter (required for Medicare/Medicaid patients)    Complete by:  As directed    I Haidar Muse certify that this patient is under my care  and that I, or a nurse practitioner or physician's assistant working with me, had a face-to-face encounter that meets the physician face-to-face encounter requirements with this patient on 04/30/2017. The encounter with the patient was in whole, or in part for the following medical condition(s) which is the primary reason for home health care (List medical condition):medication management   The encounter with the patient was in whole, or in part, for the following medical condition, which is the primary reason for home health care:  FTT   I certify that, based on my findings, the following services are medically necessary home health services:  Nursing   Reason for Medically Necessary Home Health Services:  Skilled Nursing- Change/Decline in Patient Status   My clinical findings support the need for the above services:  OTHER SEE COMMENTS   Further, I certify that my clinical findings support that this patient is homebound due to:  Mental confusion   Home Health    Complete by:  As directed    To  provide the following care/treatments:   RN Social work     Increase activity slowly    Complete by:  As directed    Increase activity slowly    Complete by:  As directed      Allergies as of 04/30/2017      Reactions   Demerol Itching, Nausea And Vomiting   Erythromycin Rash      Medication List    STOP taking these medications   clonazePAM 0.5 MG tablet Commonly known as:  KLONOPIN   lithium 300 MG tablet   nicotine 7 mg/24hr patch Commonly known as:  NICODERM CQ - dosed in mg/24 hr   Oxcarbazepine 300 MG tablet Commonly known as:  TRILEPTAL   QUEtiapine 50 MG tablet Commonly known as:  SEROQUEL   traMADol 50 MG tablet Commonly known as:  ULTRAM     TAKE these medications   anastrozole 1 MG tablet Commonly known as:  ARIMIDEX Take 1 tablet (1 mg total) by mouth daily.   Biotin 1 MG Caps Take 1 mg by mouth daily.   cholecalciferol 1000 units tablet Commonly known as:   VITAMIN D Take 1,000 Units by mouth daily.   fluticasone 50 MCG/ACT nasal spray Commonly known as:  FLONASE Place 2 sprays into both nostrils daily. Start taking on:  05/01/2017   gabapentin 300 MG capsule Commonly known as:  NEURONTIN Take 1 capsule (300 mg total) by mouth 2 (two) times daily.   hydrOXYzine 50 MG tablet Commonly known as:  ATARAX/VISTARIL Take 1 tablet (50 mg total) by mouth at bedtime as needed (for sleep).   ibuprofen 800 MG tablet Commonly known as:  ADVIL,MOTRIN Take 800 mg by mouth 3 (three) times daily.   lamoTRIgine 25 MG tablet Commonly known as:  LAMICTAL Take 1 tablet (25 mg total) by mouth 2 (two) times daily. What changed:  medication strength  how much to take  when to take this   loratadine 10 MG tablet Commonly known as:  CLARITIN Take 1 tablet (10 mg total) by mouth daily.   lurasidone 40 MG Tabs tablet Commonly known as:  LATUDA Take 1 tablet (40 mg total) by mouth daily with breakfast. Start taking on:  05/01/2017   pantoprazole 40 MG tablet Commonly known as:  PROTONIX Take 40 mg by mouth daily.   potassium chloride 10 MEQ CR capsule Commonly known as:  MICRO-K Take 1 capsule (10 mEq total) by mouth daily.   traZODone 100 MG tablet Commonly known as:  DESYREL Take 1 tablet (100 mg total) by mouth at bedtime. What changed:  how much to take      Allergies  Allergen Reactions  . Demerol Itching and Nausea And Vomiting  . Erythromycin Rash   Follow-up Information    Arnoldo Morale, MD Follow up in 1 week(s).   Specialty:  Family Medicine Contact information: [place] [address] [phone]        [facility] Follow up.   Why:  HH nursing, Environmental education officer information: [place] [address] [phone]        Dr. Corena Pilgrim. Schedule an appointment as soon as possible for a visit.   Why:  within 1 week Contact  information: [place] [address], [place] [address] [phone]           The results of significant diagnostics from this hospitalization (including imaging, microbiology, ancillary and laboratory) are  listed below for reference.    Significant Diagnostic Studies: No results found.  Microbiology: No results found for this or any previous visit (from the past 240 hour(s)).   Labs: Basic Metabolic Panel:  Recent Labs Lab 04/25/17 1051 04/25/17 2139 04/26/17 0536 04/27/17 0547  NA 136 140 141 142  K 4.5 3.7 3.9 3.7  CL 104 107 111 114*  CO2 24 26 24 22   GLUCOSE 80 86 80 83  BUN 13 12 10 6   CREATININE 0.90 0.90 0.78 0.68  CALCIUM 9.6 9.6 8.9 8.4*  MG  --   --  1.8  --    Liver Function Tests:  Recent Labs Lab 04/25/17 1051 04/25/17 2139 04/26/17 0536 04/27/17 0547  AST 15 13* 16 14*  ALT 12* 8* 10* 11*  ALKPHOS 47 48 46 43  BILITOT 0.3 0.4 0.5 0.4  PROT 6.9 6.7 5.8* 5.5*  ALBUMIN 4.4 4.3 3.8 3.6   No results for input(s): LIPASE, AMYLASE in the last 168 hours. No results for input(s): AMMONIA in the last 168 hours. CBC:  Recent Labs Lab 04/25/17 1051 04/25/17 2139 04/26/17 0536 04/27/17 0547  WBC 4.3 5.8 4.6 3.7*  NEUTROABS 3.2 4.2  --  2.3  HGB 13.6 11.5* 10.6* 10.7*  HCT 42.0 35.5* 32.3* 31.5*  MCV 96.1 95.2 95.0 93.8  PLT 174 197 157 154   Cardiac Enzymes: No results for input(s): CKTOTAL, CKMB, CKMBINDEX, TROPONINI in the last 168 hours. BNP: BNP (last 3 results) No results for input(s): BNP in the last 8760 hours.  ProBNP (last 3 results) No results for input(s): PROBNP in the last 8760 hours.  CBG: No results for input(s): GLUCAP in the last 168 hours.     SignedFlorencia Reasons MD, PhD  [facility] 04/30/2017, 2:30 PM

## 2017-04-30 NOTE — Consult Note (Signed)
[facility] Consult   Reason for Consult:  Bipolar disorder Referring Physician: Dr.  Erlinda Hong Patient Identification: [name] MRN:  [id number] Principal Diagnosis: Bipolar I disorder, most recent episode depressed [facility]) Diagnosis:   Patient Active Problem List   Diagnosis Date Noted  . Lithium overdose [T56.891A] 04/26/2017  . Acute encephalopathy [G93.40] 03/16/2017  . Acute lower UTI [N39.0]   . Hypokalemia [E87.6] 02/18/2017  . Gait abnormality [R26.9] 01/06/2017  . Bipolar I disorder, most recent episode depressed ([place]) [F31.30] 12/08/2016  . Adjustment disorder with anxiety [F43.22] 12/06/2016  . Delirium due to another medical condition [F05] 12/03/2016  . Confusion [R41.0] 12/03/2016  . Diarrhea [R19.7] 12/03/2016  . Metastatic breast cancer ([place]) [C50.919]   . Cerumen impaction [H61.20] 11/25/2016  . Otitis media [H66.90] 11/06/2016  . Brain metastasis ([place]) [C79.31] 07/27/2016  . Iron deficiency anemia [D50.9] 06/26/2016  . Bone metastases ([place]) [C79.51] 06/03/2016  . Primary cancer of lower-inner quadrant of left female breast ([place]) [C50.312] 06/01/2016  . Pap smear for cervical cancer screening [Z12.4] 03/28/2015  . Current smoker [F17.200] 03/28/2015  . Healthcare maintenance [Z00.00] 03/28/2015  . Seasonal allergies [J30.2] 03/28/2015  . Anxiety state [F41.1] 02/28/2015  . Fibromyalgia [M79.7] 02/28/2015  . Family history of diabetes mellitus [Z83.3] 02/28/2015  . H/O alcohol abuse [Z87.898]     Total Time spent with patient: 45 minutes  Subjective:   [name] is a 56 y.o. female patient admitted with Lithium overdose.  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 Patient has been receiving cancer therapy as recommended and also receiving outpatient psychiatric medication  management from neuropsychiatry. Reportedly patient was confused and accidentally taken overdose of lithium which resulted agitation and admitting to the [facility]. Patient is known to this provider from her previous psychiatric consultation in the hospital. Patient has been stable without irritability, agitation, aggressive behaviors, mood swings and her mental status has been cleared without confusion, delusional thoughts or diffusion. Patient has no evidence of auditory/visual hallucinations, delusions and paranoia. Patient denies current suicidal/homicidal ideation, intention or plans and contract for safety.   Past Psychiatric History: Bipolar disorder and alcohol use disorder in remission.  Risk to Self: Is patient at risk for suicide?: No Risk to Others:   Prior Inpatient Therapy:   Prior Outpatient Therapy:    Past Medical History:  Past Medical History:  Diagnosis Date  . Alcohol abuse   . Anemia    during chemo  . Anxiety    At age 61  . Arthritis Dx 2010  . Bipolar disorder ([place])   . Cancer ([place])    breast mets to brain  . Chronic pain   . Complication of anesthesia   . Depression   . Fibromyalgia Dx 2005  . GERD (gastroesophageal reflux disease)   . Headache    hx  migraines  . Opiate dependence ([place])   . PONV (postoperative nausea and vomiting)   . Port-a-cath in place   . PTSD (post-traumatic stress disorder)     Past Surgical History:  Procedure Laterality Date  . APPLICATION OF CRANIAL NAVIGATION N/A 08/14/2016   Procedure: APPLICATION OF CRANIAL NAVIGATION;  Surgeon: Erline Levine, MD;  Location: [facility]UR[facility] ORS;  Service: Neurosurgery;  Laterality: N/A;  . BREAST RECONSTRUCTION Left    with silicone implant  . CRANIOTOMY  N/A 08/14/2016   Procedure: CRANIOTOMY TUMOR EXCISION WITH Lucky Rathke;  Surgeon: Erline Levine, MD;  Location: [facility] [facility]UR[facility];  Service: Neurosurgery;  Laterality: N/A;  . FIBULA FRACTURE SURGERY Left   . MASTECTOMY Left   .  RADIOLOGY WITH ANESTHESIA N/A 07/23/2016   Procedure: MRI OF BRAIN WITH AND WITHOUT;  Surgeon: Medication Radiologist, MD;  Location: [facility];  Service: Radiology;  Laterality: N/A;  . RADIOLOGY WITH ANESTHESIA N/A 09/08/2016   Procedure: MRI OF BRAIN WITH AND WITHOUT CONTRAST;  Surgeon: Medication Radiologist, MD;  Location: [facility];  Service: Radiology;  Laterality: N/A;  . RADIOLOGY WITH ANESTHESIA N/A 12/10/2016   Procedure: MRI OF BRAIN WITH AND WITHOUT;  Surgeon: Medication Radiologist, MD;  Location: [facility];  Service: Radiology;  Laterality: N/A;  . RADIOLOGY WITH ANESTHESIA N/A 03/02/2017   Procedure: MRI of BRAIN W and W/OUT CONTRAST;  Surgeon: Medication Radiologist, MD;  Location: [facility];  Service: Radiology;  Laterality: N/A;  . right power port placement Right    Family History:  Family History  Problem Relation Age of Onset  . Diabetes Mother   . Bipolar disorder Mother   . CAD Father    Family Psychiatric  History: Patient has no family history of mental illness. Patient has a two children who are grown up living on their own.  Social History:  History  Alcohol Use No    Comment: no ETOH since 08/22/12     History  Drug Use No    Comment: prescription opiates from the street daily until Sunday    Social History   Social History  . Marital status: Single    Spouse name: N/A  . Number of children: N/A  . Years of education: N/A   Social History Main Topics  . Smoking status: Current Every Day Smoker    Packs/day: 0.25    Types: Cigarettes  . Smokeless tobacco: Never Used     Comment: Pt is on Chantix at present time  . Alcohol use No     Comment: no ETOH since 08/22/12  . Drug use: No     Comment: prescription opiates from the street daily until Sunday  . Sexual activity: No     Comment: ablation   Other Topics Concern  . None   Social History Narrative  . None   Additional Social History:    Allergies:   Allergies  Allergen Reactions  . Demerol Itching  and Nausea And Vomiting  . Erythromycin Rash    Labs:  No results found for this or any previous visit (from the past 48 hour(s)).  Current Facility-Administered Medications  Medication Dose Route Frequency Provider Last Rate Last Dose  . acetaminophen (TYLENOL) tablet 650 mg  650 mg Oral Q6H PRN Rise Patience, MD   650 mg at 04/30/17 7510   Or  . acetaminophen (TYLENOL) suppository 650 mg  650 mg Rectal Q6H PRN Rise Patience, MD      . alum & mag hydroxide-simeth (MAALOX/MYLANTA) 200-200-20 MG/5ML suspension 30 mL  30 mL Oral Q4H PRN Aline August, MD   30 mL at 04/26/17 1930  . anastrozole (ARIMIDEX) tablet 1 mg  1 mg Oral Daily Magrinat, Virgie Dad, MD   1 mg at 04/30/17 2585  . fluticasone (FLONASE) 50 MCG/ACT nasal spray 2 spray  2 spray Each Nare Daily Florencia Reasons, MD   2 spray at 04/30/17 [phone]  . gabapentin (NEURONTIN) capsule 300 mg  300 mg Oral BID Akintayo, Mojeed,  MD   300 mg at 04/30/17 0936  . haloperidol lactate (HALDOL) injection 5 mg  5 mg Intravenous Q6H PRN Aline August, MD   5 mg at 04/29/17 2119  . heparin lock flush 100 unit/mL  500 Units Intracatheter Prior to discharge Aline August, MD      . lamoTRIgine (LAMICTAL) tablet 25 mg  25 mg Oral BID Corena Pilgrim, MD   25 mg at 04/30/17 0937  . loratadine (CLARITIN) tablet 10 mg  10 mg Oral Daily Florencia Reasons, MD   10 mg at 04/30/17 0713  . lurasidone (LATUDA) tablet 40 mg  40 mg Oral Q breakfast Darleene Cleaver, Mojeed, MD   40 mg at 04/30/17 0734  . nicotine (NICODERM CQ - dosed in mg/24 hours) patch 21 mg  21 mg Transdermal Daily Aline August, MD   21 mg at 04/30/17 0939  . ondansetron (ZOFRAN) injection 4 mg  4 mg Intravenous Q6H PRN Aline August, MD   4 mg at 04/30/17 0808  . pantoprazole (PROTONIX) EC tablet 40 mg  40 mg Oral Daily Florencia Reasons, MD   40 mg at 04/30/17 5102  . potassium chloride (K-DUR,KLOR-CON) CR tablet 10 mEq  10 mEq Oral Daily Florencia Reasons, MD   10 mEq at 04/30/17 0936  . sodium chloride flush  (NS) 0.9 % injection 10-40 mL  10-40 mL Intracatheter PRN Aline August, MD   10 mL at 04/27/17 0409  . traMADol (ULTRAM) tablet 50 mg  50 mg Oral Q6H PRN Aline August, MD   50 mg at 04/30/17 0657  . traZODone (DESYREL) tablet 100 mg  100 mg Oral QHS Akintayo, Mojeed, MD   100 mg at 04/29/17 2117    Musculoskeletal: Strength & Muscle Tone: within normal limits Gait & Station: unsteady Patient leans: Front  Psychiatric Specialty Exam: Physical Exam  Psychiatric: Thought content normal. Her mood appears anxious. Her affect is labile. Her speech is rapid and/or pressured, tangential and slurred. She is agitated, aggressive and actively hallucinating. Cognition and memory are impaired. She expresses impulsivity.    Review of Systems  Constitutional: Positive for malaise/fatigue and weight loss.  HENT: Negative.   Eyes: Negative.   Respiratory: Negative.   Cardiovascular: Negative.   Gastrointestinal: Negative.   Genitourinary: Negative.   Musculoskeletal: Positive for myalgias.  Skin: Negative.   Neurological: Positive for tremors.  Psychiatric/Behavioral: Positive for depression. The patient is nervous/anxious and has insomnia.     Blood pressure 109/76, pulse 84, temperature 98.1 F (36.7 C), temperature source Oral, resp. rate 16, height 5\' 5"  (1.651 m), weight 56.2 kg (123 lb 14.4 oz), SpO2 98 %.Body mass index is 20.62 kg/m.  General Appearance: Casual  Eye Contact:  Good  Speech:  Clear and Coherent  Volume:  Normal  Mood:  Anxious and Depressed  Affect:  Labile  Thought Process:  Coherent and Goal Directed  Orientation:  Full (Time, Place, and Person)  Thought Content:  Logical  Suicidal Thoughts:  No, denied Ideation, intention or plans   Homicidal Thoughts:  No  Memory:  Immediate;   Fair Recent;   Fair Remote;   Fair  Judgement:  Poor  Insight:  Lacking  Psychomotor Activity:  Normal  Concentration:  Concentration: Good and Attention Span: Fair  Recall:  [employer] of Knowledge:  Fair  Language:  Good  Akathisia:  No  Handed:  Right  AIMS (if indicated):     Assets:  Communication Skills  ADL's:  Intact  Cognition:  WNL  Sleep:   poor     Treatment Plan Summary: Patient has been suffering with bipolar disorder and status post accidental lithium overdose secondary to medication trials. Patient reported she has been doing well with her current medications and positively responded with her medication adjustment during this hospitalization. Patient has mild emotional difficulties but no irritability agitation or aggressive behavior. Patient has no safety concerns at this time patient would like to participate in outpatient medication management at neuropsychiatry after discharge. Patient may benefit from home health care monitoring her medication management at home.  Case discussed with the hospitalist Dr. Erlinda Hong and CSW on the unit. Discontinue Air cabin crew as patient does not have any safety concerns at this time Continue Lamictal 25 mg bid for mood swings Continue Latuda 40 mg qhs for psychosis. Continue Gabapentin 300 mg bid for aggression/anxiety Continue Trazodone 100 mg qhs for sleep. May add hydroxyzine 50 mg at bedtime for insomnia as needed.  Disposition: Is patient showed significant improvement in her mental status she does not meet criteria for acute psychiatric hospitalization and will be referred to the outpatient medication management along with home health care. No evidence of imminent risk to self or others at present.   Patient does not meet criteria for psychiatric inpatient admission. Supportive therapy provided about ongoing stressors.    Ambrose Finland, MD 04/30/2017 2:51 PM

## 2017-04-30 NOTE — Progress Notes (Signed)
Patient Ambulating well in hallway, no new complaints, sitter in room, awaiting for psych placement

## 2017-04-30 NOTE — Progress Notes (Signed)
Psych CSW already following for Psych to re eval today-await recc. MD updated.

## 2017-05-03 ENCOUNTER — Telehealth: Payer: Self-pay | Admitting: Family Medicine

## 2017-05-03 NOTE — Telephone Encounter (Signed)
MA provided verbal orders to [facility] for patient to have 3-4 visits. No further questions or concerns at this time.

## 2017-05-03 NOTE — Telephone Encounter (Signed)
Kim from [facility] called needing 3-4 visits authorized to f/up with patient. Please f/up

## 2017-05-04 ENCOUNTER — Other Ambulatory Visit: Payer: Self-pay | Admitting: *Deleted

## 2017-05-05 ENCOUNTER — Telehealth: Payer: Self-pay

## 2017-05-05 ENCOUNTER — Other Ambulatory Visit: Payer: Self-pay

## 2017-05-05 MED ORDER — ONDANSETRON HCL 8 MG PO TABS: 8.0000 mg | ORAL_TABLET | Freq: Three times a day (TID) | ORAL | 0 refills | Status: DC | PRN

## 2017-05-05 MED ORDER — IBUPROFEN 800 MG PO TABS: 800.0000 mg | ORAL_TABLET | Freq: Three times a day (TID) | ORAL | 0 refills | Status: DC

## 2017-05-05 NOTE — Telephone Encounter (Signed)
Pt called requesting zofran and ibuprofen refills. zofran was refilled per multiple rx in past and still in active treatment with herceptin. Wilber Bihari NP asked to address ibuprofen since it has not been rx by Dr [name].

## 2017-05-06 ENCOUNTER — Inpatient Hospital Stay: Payer: Self-pay

## 2017-05-06 NOTE — Progress Notes (Deleted)
Patient ID: [name], female   DOB: 20-Apr-1961, 56 y.o.   MRN: [id number] After being hospitalized 5/27-6/11/2016 for lithium overdose.    Hospital summary: [name] a 56 y.o.femalewith history of metastatic breast cancer, bipolar disorder, alcohol abuse in remission, anemia presents to the ER because of weakness and tremors. Patient was recently admitted for confusion and tremors and at that time patient's lithium was discontinued. On follow-up with psychiatrist patient lithium was again started back. Patient originally presented to [facility] yesterday with complaints of weakness and falls. Lithium levels at that time was 1.6. Patient left AGAINST MEDICAL ADVICE and came to [facility].  ED Course:Patient was complaining of weakness and tremors and on exam patient has generalized tremors. Lithium levels O year was 1.2. Sodium and creatinine were normal. Patient was given 1 L normal saline bolus and admitted for further observation. On exam patient appears mildly confused but is oriented to time place and person. Moves all extremities. Patient states she had taken lithium as advised and not overdosed. Though her in the earlier visit yesterday she said that she may have taken extra dose without knowledge.  Hospital Course:  Principal Problem:   Bipolar I disorder, most recent episode depressed ([place]) Active Problems:   Metastatic breast cancer ([name])   Lithium overdose  Elevated lithium level/lithium overdose -  - status post IV fluid hydration as per prior discussion with poison control. lithium has normalized. Lithium discontinued.   Psychiatry has evaluated the patient , psych meds adjusted  "Lamictal 25 mg bid for mood -Latuda 40 mg qhs for psychosis. -Gabapentin 300 mg bid for aggression/anxiety -Trazodone 100 mg qhs for sleep."  Confusion and intermittent agitation in a patient with history of bipolar disorder:  - Patient wasinvoluntarily committed  because of confusion, intermittent agitation.Psychiatric evaluation initially on 5/28 recommended inpatient psych placement,  reeval by psych on 6/1 cleared patient to discharge home with outpatient psych follow up.   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 Continue Arimidex and Herceptin. Patient was seen by Dr. [name] in the hospital and will follow up in 1 week.   Patient report some unsteadiness since after brain surgery for tumor resection, physical therapy evaluated patient and recommend outpatient PT, referral made.  Irondeficiency anemia - receives iron replacement at oncology office.  History of alcohol abuse in remission.

## 2017-05-07 ENCOUNTER — Other Ambulatory Visit: Payer: Self-pay

## 2017-05-07 ENCOUNTER — Ambulatory Visit: Payer: Self-pay

## 2017-05-07 ENCOUNTER — Other Ambulatory Visit: Payer: Self-pay | Admitting: *Deleted

## 2017-05-07 MED ORDER — IBUPROFEN 800 MG PO TABS: 800.0000 mg | ORAL_TABLET | Freq: Three times a day (TID) | ORAL | 0 refills | Status: DC

## 2017-05-10 NOTE — Progress Notes (Signed)
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  She was recently released from the hospital about 8 days ago for her mental health.  She is feeling very well today.  She is taking Trazodone 237m at night to be able to sleep and is not taking the vistaril.  She continues to take anastrozole daily.  No new pain anywhere.  She does have chronic pain in her head/neck and shoulders.  Her tramadol was discontinued.  She has follow up with primary care this Friday, and psychiatry next Monday.    REVIEW OF SYSTEMS: She does have a sore throat.  She is concerned about possibly being sick.  She woke up with some mild tingling in her left arm that she thinks she may have slept on wrong.  It has since resolved.  She is wanting [facility] write for some pain medication for her muscle pain in her neck and back.    BREAST CANCER HISTORY: From the original intake note:  EChericewas aware of a "lemon sized lump in" her left axilla for about a year before bringing it to medical attention. By then she had developed left breast and left axillary swelling (June 2016). She presented to the local emergency room and had a chest CT scan 06/06/2015 which showed a nodule in the left breast measuring 0.9 cm and questionable left axillary adenopathy. She then proceeded to bilateral diagnostic mammography and left breast ultrasonography 06/19/2015. There were no prior films for comparison (last mammography 12 years prior)..  The breast density was category C. Mammography showed in the left breast upper inner quadrant a 7 cm area including a small mass and significant pleomorphic calcifications. Ultrasonography defined the mass as measuring 1.2 cm. The left axilla appeared unremarkable. There was significant skin edema.  Biopsy of the left breast mass 06/19/2015 showed (SP [id number] an invasive ductal carcinoma, grade 2, estrogen receptor 83% positive, progesterone receptor 26% positive, and HER-2 amplified by immunohistochemstry with a 3+ reading. The patient had biopsies of a separate area in the left breast August of the same year and this showed atypical ductal hyperplasia. (SP [id number].  Accordingly after appropriate discussion on 08/21/2015 the patient proceeded to left mastectomy with left axillary sentinel lymph node sampling, which, since the lymph nodes were positive, extended to the procedure to left axillary lymph node dissection. The pathology (SP [id number] showed an invasive ductal carcinoma, grade 3, measuring in excess of 9 cm. There were also skin satellites, not contiguous with the invasive carcinoma. Margins were clear and ample. There was evidence of lymphovascular invasion. A total of 15 lymph nodes were removed, including 5 sentinel lymph nodes, all of which were positive, so that the final total was 14 out of 15 lymph nodes involved by tumor. There was evidence of extranodal extension. The final pathology was pT4b pN2a, stage IIIB  CA-27-29 and CEA 09/19/2015 were non-informative October 2016.  Unfortunately CT scans of the chest abdomen and pelvis 09/16/2015 showed bony metastases to the right scapula, left iliac crest, and also L4  and T-spine. There were questionable liver cysts which on repeat CT scan 03/02/2016 appear to be a little bit more well-defined, possibly a little larger. There were also some possible right upper lobe lung lesions.  Adjuvant treatment consisted of docetaxel, trastuzumab and  pertuzumab, with the final (6th) docetaxel dose given 02/11/2016. She continues on trastuzumab and pertuzumab, with the 11th cycle given 05/05/2016. Echocardiogram 02/26/2016 showed an ejection fraction of 55%. She receives denosumab/Xgeva every 6 weeks.. She also receives radiation, started 06/09/201, to be completed 06/26/2016.  Her subsequent history is as detailed below   PAST MEDICAL HISTORY: Past Medical History:  Diagnosis Date  . Alcohol abuse   . Anemia    during chemo  . Anxiety    At age 77  . Arthritis Dx 2010  . Bipolar disorder ([place])   . Cancer (HCC)    breast  . Chronic pain   . Depression   . Fibromyalgia Dx 2005  . GERD (gastroesophageal reflux disease)   . Headache    hx  migraines  . Opiate dependence ([place])   . Port-a-cath in place   . PTSD (post-traumatic stress disorder)     PAST SURGICAL HISTORY: Past Surgical History:  Procedure Laterality Date  . APPLICATION OF CRANIAL NAVIGATION N/A 08/14/2016   Procedure: APPLICATION OF CRANIAL NAVIGATION;  Surgeon: Erline Levine, MD;  Location: [facility];  Service: Neurosurgery;  Laterality: N/A;  . BREAST RECONSTRUCTION Left    with silicone implant  . CRANIOTOMY N/A 08/14/2016   Procedure: CRANIOTOMY TUMOR EXCISION WITH Lucky Rathke;  Surgeon: Erline Levine, MD;  Location: [facility];  Service: Neurosurgery;  Laterality: N/A;  . FIBULA FRACTURE SURGERY    . MASTECTOMY Left   . RADIOLOGY WITH ANESTHESIA N/A 07/23/2016   Procedure: MRI OF BRAIN WITH AND WITHOUT;  Surgeon: Medication Radiologist, MD;  Location: [facility];  Service: Radiology;  Laterality: N/A;  . right power port placement Right     FAMILY HISTORY Family History  Problem Relation Age of Onset  . Diabetes Mother   . Bipolar disorder Mother   . CAD Father   The patient's father still living, age 84, in [place]. He had prostate cancer at some point in the past. The patient's mother died at age 26 from complications of diabetes. The patient had  no brothers, 2 sisters. A paternal grandmother had lung cancer in the setting of tobacco abuse. There is no other history of cancer in the family to her knowledge  GYNECOLOGIC HISTORY:  No LMP recorded. Patient has had an ablation. Menarche approximately age 5. First live birth in 34. The patient is GX P2. She underwent endometrial ablation in 2016.  SOCIAL HISTORY:  The patient is originally from [place]. She has lived in [place] before but more recently was in [place]. She is back here because she could not afford her rent in [place]. She is living here and a temporary situation. She is divorced. HER-2 children are [name] who lives in [place] and works as a Development worker, community, and [name] who also lives in [place] and works as a Catering manager. The patient has a grandchild, Arelia Longest, 30 years old as of July 2017, living in [place] with his mother. The patient has not established herself with a local church yet.    ADVANCED DIRECTIVES: Not in place; at the 06/03/2016 visit the patient was given the appropriate forms to complete and notarize at her discretion   HEALTH MAINTENANCE: Social History  Substance Use Topics  . Smoking status:  Current Every Day Smoker    Packs/day: 0.50    Types: Cigarettes  . Smokeless tobacco: Never Used     Comment: Pt is on Chantix at present time  . Alcohol use Yes     Comment: no ETOH since 08/22/12     Colonoscopy:  PAP:  Bone density:   Allergies  Allergen Reactions  . Demerol Itching and Nausea And Vomiting  . Erythromycin Rash    Can take zpak    Current Outpatient Prescriptions  Medication Sig Dispense Refill  . cetirizine (ZYRTEC) 10 MG tablet Take 1 tablet (10 mg total) by mouth daily. 30 tablet 11  . dexamethasone (DECADRON) 4 MG tablet Take 1 tablet (4 mg total) by mouth 2 (two) times daily. 60 tablet 0  . diphenoxylate-atropine (LOMOTIL) 2.5-0.025 MG tablet Take 2 tablets by mouth 4 (four) times daily as needed for diarrhea or  loose stools. 40 tablet 0  . fluticasone (FLONASE) 50 MCG/ACT nasal spray Place 1 spray into the nose daily.    [name] gabapentin (NEURONTIN) 300 MG capsule Take 2 capsules (600 mg total) by mouth 3 (three) times daily. 300 mg at night for one day, twice daily for one day, then three times daily 180 capsule 2  . hyaluronate sodium (RADIAPLEXRX) GEL Apply 1 application topically.    [name] HYDROcodone-acetaminophen (NORCO) 7.5-325 MG tablet Take 1-2 tablets by mouth every 6 (six) hours as needed for severe pain (give one tab, then if other tab is needed, may give 30 min later.). 60 tablet 0  . ibuprofen (ADVIL,MOTRIN) 200 MG tablet Take 800 mg by mouth every 6 (six) hours as needed for mild pain.    [name] lamoTRIgine (LAMICTAL) 100 MG tablet Take 1.5 tablets (150 mg total) by mouth 2 (two) times daily. 60 tablet 3  . lidocaine-prilocaine (EMLA) cream Apply 1 application topically as needed. 30 g 0  . lithium carbonate 300 MG capsule Take 1 capsule (300 mg total) by mouth 2 (two) times daily with a meal. 60 capsule 3  . Loperamide HCl (IMODIUM PO) Take 1 tablet by mouth daily as needed (diarhea).    . LORazepam (ATIVAN) 0.5 MG tablet Take 1 tablet (0.5 mg total) by mouth every 8 (eight) hours as needed for anxiety. 30 tablet 1  . ondansetron (ZOFRAN) 8 MG tablet Take 1 tablet (8 mg total) by mouth every 8 (eight) hours as needed for nausea or vomiting. 30 tablet 1  . pantoprazole (PROTONIX) 40 MG tablet Take 1 tablet (40 mg total) by mouth daily. 30 tablet 3  . polyethylene glycol powder (GLYCOLAX/MIRALAX) powder Take 17 g by mouth 2 (two) times daily as needed for mild constipation. (Patient not taking: Reported on 08/13/2016) 3350 g 3  . potassium chloride (K-DUR) 10 MEQ tablet Take 1 tablet (10 mEq total) by mouth daily. 30 tablet 3  . prochlorperazine (COMPAZINE) 10 MG tablet Take 10 mg by mouth daily as needed for nausea.     [name] tiZANidine (ZANAFLEX) 4 MG tablet Take 1 tablet (4 mg total) by mouth daily. 30  tablet 0  . traMADol (ULTRAM) 50 MG tablet Take 1 tablet (50 mg total) by mouth every 6 (six) hours as needed. 60 tablet 0  . trazodone (DESYREL) 300 MG tablet Take 1 tablet (300 mg total) by mouth at bedtime. 30 tablet 3  . triamcinolone cream (KENALOG) 0.5 % Apply 1 application topically daily as needed.  1  . trolamine salicylate (ASPERCREME) 10 % cream Apply 1 application topically  as needed for muscle pain. Reported on 06/01/2016    . varenicline (CHANTIX) 0.5 MG tablet Take 1 tablet (0.5 mg total) by mouth daily. 30 tablet 3  . Vitamin D, Cholecalciferol, 1000 units CAPS Take 1,000 Units by mouth daily.     No current facility-administered medications for this visit.     OBJECTIVE:  Vitals:   05/11/17 0938  BP: 108/66  Pulse: (!) 58  Resp: 20  Temp: 98.3 F (36.8 C)  TempSrc: Oral  SpO2: 100%  Weight: 126 lb 12.8 oz (57.5 kg)  Height: 5' 5"  (1.651 m)  Body mass index is 21.1 kg/m. GENERAL: Patient is a well appearing female in no acute distress HEENT:  Sclerae anicteric.  Oropharynx clear and moist. No ulcerations or evidence of oropharyngeal candidiasis. Neck is supple.  NODES:  No cervical, supraclavicular, or axillary lymphadenopathy palpated.  BREAST EXAM:  Deferred. LUNGS:  Clear to auscultation bilaterally.  No wheezes or rhonchi. HEART:  Regular rate and rhythm. No murmur appreciated. ABDOMEN:  Soft, nontender.  Positive, normoactive bowel sounds. No organomegaly palpated. MSK:  No focal spinal tenderness to palpation. Full range of motion bilaterally in the upper extremities. EXTREMITIES:  No peripheral edema.   SKIN:  Clear with no obvious rashes or skin changes. No nail dyscrasia. NEURO:  Nonfocal. Well oriented.  Appropriate affect.   LAB RESULTS:  CMP     Component Value Date/Time   NA 140 08/13/2016 0926   NA 139 07/28/2016 1118   K 4.3 08/13/2016 0926   K 4.1 07/28/2016 1118   CL 107 08/13/2016 0926   CO2 25 08/13/2016 0926   CO2 26 07/28/2016 1118     GLUCOSE 96 08/13/2016 0926   GLUCOSE 91 07/28/2016 1118   BUN 8 08/13/2016 0926   BUN 8.6 07/28/2016 1118   CREATININE 0.94 08/13/2016 0926   CREATININE 0.8 07/28/2016 1118   CALCIUM 9.2 08/13/2016 0926   CALCIUM 9.7 07/28/2016 1118   PROT 6.7 07/28/2016 1118   ALBUMIN 4.0 07/28/2016 1118   AST 18 07/28/2016 1118   ALT 13 07/28/2016 1118   ALKPHOS 50 07/28/2016 1118   BILITOT <0.30 07/28/2016 1118   GFRNONAA >60 08/13/2016 0926   GFRAA >60 08/13/2016 0926    INo results found for: SPEP, UPEP  Lab Results  Component Value Date   WBC 5.0 08/13/2016   NEUTROABS 4.2 07/28/2016   HGB 12.9 08/13/2016   HCT 40.8 08/13/2016   MCV 96.9 08/13/2016   PLT 171 08/13/2016      Chemistry      Component Value Date/Time   NA 140 08/13/2016 0926   NA 139 07/28/2016 1118   K 4.3 08/13/2016 0926   K 4.1 07/28/2016 1118   CL 107 08/13/2016 0926   CO2 25 08/13/2016 0926   CO2 26 07/28/2016 1118   BUN 8 08/13/2016 0926   BUN 8.6 07/28/2016 1118   CREATININE 0.94 08/13/2016 0926   CREATININE 0.8 07/28/2016 1118      Component Value Date/Time   CALCIUM 9.2 08/13/2016 0926   CALCIUM 9.7 07/28/2016 1118   ALKPHOS 50 07/28/2016 1118   AST 18 07/28/2016 1118   ALT 13 07/28/2016 1118   BILITOT <0.30 07/28/2016 1118       No results found for: LABCA2  No components found for: LABCA125  No results for input(s): INR in the last 168 hours.  Urinalysis    Component Value Date/Time   COLORURINE YELLOW 04/18/2013 [place] 04/18/2013  1222   LABSPEC 1.011 04/18/2013 1222   PHURINE 5.5 04/18/2013 1222   GLUCOSEU NEGATIVE 04/18/2013 1222   HGBUR TRACE (A) 04/18/2013 1222   BILIRUBINUR negative 03/28/2015 1321   KETONESUR NEGATIVE 04/18/2013 1222   PROTEINUR negative 03/28/2015 1321   PROTEINUR NEGATIVE 04/18/2013 1222   UROBILINOGEN 0.2 03/28/2015 1321   UROBILINOGEN 0.2 04/18/2013 1222   NITRITE negative 03/28/2015 1321   NITRITE NEGATIVE 04/18/2013 1222    LEUKOCYTESUR Trace 03/28/2015 1321     STUDIES: No results found.   ELIGIBLE FOR AVAILABLE RESEARCH PROTOCOL: no  ASSESSMENT: 56 y.o. 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  (2) status post left mastectomy and axillary lymph node dissection  with immediate expander placement 07/18/2015 for an mpT4 pN2,stage IIIB invasive ductal carcinoma, grade 3, with negative margins.  (a) definitive implant exchange to be scheduled in December   METASTATIC DISEASE: October 2016  (3) CT scan of the chest abdomen and pelvis  09/16/2015 shows metastatic lesions in the right scapula, left iliac crest, L4, and T spine. There were questionable liver cysts, with repeat CT scan 03/02/2016 showing possible right upper lobe lung lesions and possibly increased liver lesions  (a) CT scan of the chest 06/17/2016 shows no active disease in the lungs or liver  (b) Bone scan July 2017 showed no evidence of bony metastatic disease   (c) head CT 07/08/2016 showed a cerebellar lesion, confirmed by MRI 07/23/2016, status post craniotomy 08/14/2016, confirming a metastatic deposit which was estrogen and progesterone receptor negative, HER-2 amplified with a signals ratio of 7.16, number per cell 13.25  (d) CA 27-29 not informative  (4) received docetaxel every 3 weeks 6 together with trastuzumab and pertuzumab, last docetaxel dose 02/11/2016  (5) adjuvant radiation7/03/2016 to 06/26/2016 at [facility]: 1. The Left chest wall was treated to 23.4 Gy in 13 fractions at 1.8 Gy per fraction. 2. The Left chest wall was boosted to 10 Gy in 5 fractions at 2 Gy per fraction. 3. The Left Sclav/PAB was treated to 23.4 Gy in 13 fractions at 1.8 Gy per fraction.  Including the patient's treatment in Day (received 15 fractions per Dr.  Maryan Rued near [place], [place]), the patient received 50.4 Gy to the left chest wall and supraclavicular region.   (6) started trastuzumab and pertuzumab October 2016, continuing every 3 weeks,  (a) echocardiogram 02/26/2016 showed a well preserved ejection fraction  (b) echocardiogram 07/01/2016 shows an ejection fraction in the 60-65%   (c) pertuzumab discontinued 10/2016 with uncontrolled diarrhea  (d) echocardiogram 11/11/2016 showed an ejection fraction in the 60-65%  (e) echocardiogram 03/03/2017 shows an ejection fraction of 60-65%    (7) started denosumab/Xgeva October 2017, repeated every 6 weeks  (8) started anastrozole October 2017   (a) bone scan 11/10/2016 shows no active disease  (b) chest CT scan 11/10/2016 stable, with no evidence of active disease  (9) history of bipolar disorder  (a) lithium level checked every 6 weeks  (10) mild anemia with a significant drop in the MCV, ferritin 10 06/03/2016,   (a) Feraheme given 06/12/2016 and 06/18/2016  (11) tobacco abuse: Chantix started 06/18/2016   (12) 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  (a) brain irradiation10/19/17-11/08/17: Whole brain/ 35 Gy in 14 fractions   (b) repeat brain MRI obtained 12/10/2016 shows no  active disease in the brain  (c) repeat brain MRI 03/02/2017 shows no evidence of residual or recurrent disease   PLAN:  Lakishia is doing well today.  She is the best I have seen her.  She will proceed with Trastuzumab and Xgeva today.  In regards to her pain, I reviewed the issue with Dr. [name].  This is not an oncologic issue, therefore should continue the Ibuprofen as directed and follow up with her primary care doctor for any further medication needs that are not related to her breast cancer.    She will return in 3 weeks for labs and Trastuzumab, and in 6 weeks for labs, appt with Dr. [name], and Trastuzumab.  We will work on getting her echocardiogram scheduled.     A total of (30) minutes of face-to-face time was spent with this patient with greater than 50% of that time in counseling and care-coordination.   Scot Dock, NP Medical Oncology and Hematology [facility] [address] [place], [place] [address] Tel. [phone]    Fax. [phone]

## 2017-05-11 ENCOUNTER — Encounter: Payer: Self-pay | Admitting: Adult Health

## 2017-05-11 ENCOUNTER — Other Ambulatory Visit (HOSPITAL_BASED_OUTPATIENT_CLINIC_OR_DEPARTMENT_OTHER): Payer: Medicaid Other

## 2017-05-11 ENCOUNTER — Ambulatory Visit (HOSPITAL_BASED_OUTPATIENT_CLINIC_OR_DEPARTMENT_OTHER): Payer: Medicaid Other

## 2017-05-11 ENCOUNTER — Ambulatory Visit (HOSPITAL_BASED_OUTPATIENT_CLINIC_OR_DEPARTMENT_OTHER): Payer: Medicaid Other | Admitting: Adult Health

## 2017-05-11 ENCOUNTER — Encounter: Payer: Self-pay | Admitting: *Deleted

## 2017-05-11 VITALS — BP 108/66 | HR 58 | Temp 98.3°F | Resp 20 | Ht 65.0 in | Wt 126.8 lb

## 2017-05-11 DIAGNOSIS — C7931 Secondary malignant neoplasm of brain: Secondary | ICD-10-CM

## 2017-05-11 DIAGNOSIS — F319 Bipolar disorder, unspecified: Secondary | ICD-10-CM

## 2017-05-11 DIAGNOSIS — C7951 Secondary malignant neoplasm of bone: Secondary | ICD-10-CM

## 2017-05-11 DIAGNOSIS — Z5112 Encounter for antineoplastic immunotherapy: Secondary | ICD-10-CM

## 2017-05-11 DIAGNOSIS — Z79811 Long term (current) use of aromatase inhibitors: Secondary | ICD-10-CM | POA: Diagnosis not present

## 2017-05-11 DIAGNOSIS — F17200 Nicotine dependence, unspecified, uncomplicated: Secondary | ICD-10-CM

## 2017-05-11 DIAGNOSIS — C773 Secondary and unspecified malignant neoplasm of axilla and upper limb lymph nodes: Secondary | ICD-10-CM | POA: Diagnosis not present

## 2017-05-11 DIAGNOSIS — C50312 Malignant neoplasm of lower-inner quadrant of left female breast: Secondary | ICD-10-CM

## 2017-05-11 DIAGNOSIS — Z170 Estrogen receptor positive status [ER+]: Secondary | ICD-10-CM | POA: Diagnosis not present

## 2017-05-11 DIAGNOSIS — C50919 Malignant neoplasm of unspecified site of unspecified female breast: Secondary | ICD-10-CM

## 2017-05-11 LAB — CBC WITH DIFFERENTIAL/PLATELET
BASO%: 0.3 % (ref 0.0–2.0)
Basophils Absolute: 0 10*3/uL (ref 0.0–0.1)
EOS%: 1 % (ref 0.0–7.0)
Eosinophils Absolute: 0 10*3/uL (ref 0.0–0.5)
HCT: 36 % (ref 34.8–46.6)
HGB: 12 g/dL (ref 11.6–15.9)
LYMPH%: 16 % (ref 14.0–49.7)
MCH: 31.7 pg (ref 25.1–34.0)
MCHC: 33.3 g/dL (ref 31.5–36.0)
MCV: 95.2 fL (ref 79.5–101.0)
MONO#: 0.3 10*3/uL (ref 0.1–0.9)
MONO%: 6.4 % (ref 0.0–14.0)
NEUT#: 3.1 10*3/uL (ref 1.5–6.5)
NEUT%: 76.3 % (ref 38.4–76.8)
Platelets: 160 10*3/uL (ref 145–400)
RBC: 3.78 10*6/uL (ref 3.70–5.45)
RDW: 13.4 % (ref 11.2–14.5)
WBC: 4.1 10*3/uL (ref 3.9–10.3)
lymph#: 0.7 10*3/uL — ABNORMAL LOW (ref 0.9–3.3)

## 2017-05-11 LAB — COMPREHENSIVE METABOLIC PANEL
ALT: 10 U/L (ref 0–55)
AST: 14 U/L (ref 5–34)
Albumin: 4 g/dL (ref 3.5–5.0)
Alkaline Phosphatase: 48 U/L (ref 40–150)
Anion Gap: 11 mEq/L (ref 3–11)
BUN: 9.1 mg/dL (ref 7.0–26.0)
CO2: 27 mEq/L (ref 22–29)
Calcium: 9.6 mg/dL (ref 8.4–10.4)
Chloride: 106 mEq/L (ref 98–109)
Creatinine: 0.9 mg/dL (ref 0.6–1.1)
EGFR: 73 mL/min/{1.73_m2} — ABNORMAL LOW (ref >90)
Glucose: 89 mg/dl (ref 70–140)
Potassium: 4 mEq/L (ref 3.5–5.1)
Sodium: 145 mEq/L (ref 136–145)
Total Bilirubin: 0.22 mg/dL (ref 0.20–1.20)
Total Protein: 6.4 g/dL (ref 6.4–8.3)

## 2017-05-11 MED ORDER — DENOSUMAB 120 MG/1.7ML ~~LOC~~ SOLN
120.0000 mg | Freq: Once | SUBCUTANEOUS | Status: AC
  Administered 2017-05-11: 120 mg via SUBCUTANEOUS
  Filled 2017-05-11: qty 1.7

## 2017-05-11 MED ORDER — SODIUM CHLORIDE 0.9 % IV SOLN
Freq: Once | INTRAVENOUS | Status: AC
  Administered 2017-05-11: 11:00:00 via INTRAVENOUS

## 2017-05-11 MED ORDER — DIPHENHYDRAMINE HCL 25 MG PO CAPS: 25.0000 mg | ORAL_CAPSULE | Freq: Once | ORAL | Status: DC

## 2017-05-11 MED ORDER — ACETAMINOPHEN 325 MG PO TABS
650.0000 mg | ORAL_TABLET | Freq: Once | ORAL | Status: AC
  Administered 2017-05-11: 650 mg via ORAL

## 2017-05-11 MED ORDER — HEPARIN SOD (PORK) LOCK FLUSH 100 UNIT/ML IV SOLN
500.0000 [IU] | Freq: Once | INTRAVENOUS | Status: AC | PRN
  Administered 2017-05-11: 500 [IU]
  Filled 2017-05-11: qty 5

## 2017-05-11 MED ORDER — SODIUM CHLORIDE 0.9% FLUSH
10.0000 mL | INTRAVENOUS | Status: DC | PRN
  Administered 2017-05-11: 10 mL
  Filled 2017-05-11: qty 10

## 2017-05-11 MED ORDER — ACETAMINOPHEN 325 MG PO TABS
ORAL_TABLET | ORAL | Status: AC
  Filled 2017-05-11: qty 2

## 2017-05-11 MED ORDER — TRASTUZUMAB CHEMO 150 MG IV SOLR
450.0000 mg | Freq: Once | INTRAVENOUS | Status: AC
  Administered 2017-05-11: 450 mg via INTRAVENOUS
  Filled 2017-05-11: qty 21.43

## 2017-05-11 NOTE — Progress Notes (Signed)
[place] [employer] representative met with patient in the infusion room to offer support and address transportation concerns.  Patient stated her "ride" was unable to pick her up after 12:00pm, but she was comfortable taking a taxi home.  Patient stated she did not need assistance with calling the taxi.  CSW and patient discussed other transportation resources including bus passes and SCAT.  CSW reminded patient that they had completed SCAT application in January, and she was approved.  Patient stated she planned to use SCAT in the future.  Patient also reported that her SSD and Medicaid were effective June 1st.  Patient knows to call CSW with additional needs or concerns.    Johnnye Lana, MSW, LCSW, OSW-C Clinical Social Worker [facility] [phone]

## 2017-05-11 NOTE — Patient Instructions (Signed)
Trastuzumab injection for infusion What is this medicine? TRASTUZUMAB (tras TOO zoo mab) is a monoclonal antibody. It is used to treat breast cancer and stomach cancer. This medicine may be used for other purposes; ask your health care provider or pharmacist if you have questions. COMMON BRAND NAME(S): Herceptin What should I tell my health care provider before I take this medicine? They need to know if you have any of these conditions: -heart disease -heart failure -lung or breathing disease, like asthma -an unusual or allergic reaction to trastuzumab, benzyl alcohol, or other medications, foods, dyes, or preservatives -pregnant or trying to get pregnant -breast-feeding How should I use this medicine? This drug is given as an infusion into a vein. It is administered in a hospital or clinic by a specially trained health care professional. Talk to your pediatrician regarding the use of this medicine in children. This medicine is not approved for use in children. Overdosage: If you think you have taken too much of this medicine contact a poison control center or emergency room at once. NOTE: This medicine is only for you. Do not share this medicine with others. What if I miss a dose? It is important not to miss a dose. Call your doctor or health care professional if you are unable to keep an appointment. What may interact with this medicine? This medicine may interact with the following medications: -certain types of chemotherapy, such as daunorubicin, doxorubicin, epirubicin, and idarubicin This list may not describe all possible interactions. Give your health care provider a list of all the medicines, herbs, non-prescription drugs, or dietary supplements you use. Also tell them if you smoke, drink alcohol, or use illegal drugs. Some items may interact with your medicine. What should I watch for while using this medicine? Visit your doctor for checks on your progress. Report any side effects.  Continue your course of treatment even though you feel ill unless your doctor tells you to stop. Call your doctor or health care professional for advice if you get a fever, chills or sore throat, or other symptoms of a cold or flu. Do not treat yourself. Try to avoid being around people who are sick. You may experience fever, chills and shaking during your first infusion. These effects are usually mild and can be treated with other medicines. Report any side effects during the infusion to your health care professional. Fever and chills usually do not happen with later infusions. Do not become pregnant while taking this medicine or for 7 months after stopping it. Women should inform their doctor if they wish to become pregnant or think they might be pregnant. Women of child-bearing potential will need to have a negative pregnancy test before starting this medicine. There is a potential for serious side effects to an unborn child. Talk to your health care professional or pharmacist for more information. Do not breast-feed an infant while taking this medicine or for 7 months after stopping it. Women must use effective birth control with this medicine. What side effects may I notice from receiving this medicine? Side effects that you should report to your doctor or health care professional as soon as possible: -allergic reactions like skin rash, itching or hives, swelling of the face, lips, or tongue -chest pain or palpitations -cough -dizziness -feeling faint or lightheaded, falls -fever -general ill feeling or flu-like symptoms -signs of worsening heart failure like breathing problems; swelling in your legs and feet -unusually weak or tired Side effects that usually do not require medical   attention (report to your doctor or health care professional if they continue or are bothersome): -bone pain -changes in taste -diarrhea -joint pain -nausea/vomiting -weight loss This list may not describe all  possible side effects. Call your doctor for medical advice about side effects. You may report side effects to FDA at 1-800-FDA-1088. Where should I keep my medicine? This drug is given in a hospital or clinic and will not be stored at home. NOTE: This sheet is a summary. It may not cover all possible information. If you have questions about this medicine, talk to your doctor, pharmacist, or health care provider.  2018 Elsevier/Gold Standard (2016-11-10 14:37:52)  

## 2017-05-12 LAB — CANCER ANTIGEN 27.29: CA 27.29: 6.5 U/mL (ref 0.0–38.6)

## 2017-05-13 ENCOUNTER — Telehealth: Payer: Self-pay

## 2017-05-13 ENCOUNTER — Encounter: Payer: Self-pay | Admitting: *Deleted

## 2017-05-13 MED ORDER — ANASTROZOLE 1 MG PO TABS: 1.0000 mg | ORAL_TABLET | Freq: Every day | ORAL | 1 refills | Status: DC

## 2017-05-13 MED ORDER — PANTOPRAZOLE SODIUM 40 MG PO TBEC: 40.0000 mg | DELAYED_RELEASE_TABLET | Freq: Every day | ORAL | 3 refills | Status: DC

## 2017-05-13 NOTE — Telephone Encounter (Signed)
Pt called for anastrozole and protonix refill and a question about tramadol. S/w Mendel Ryder and called pt back. Dr [name] does not feel her pain is related to her oncologic condition so she will need to see her PCP for pain medications. Pt states she sees PCP next week and is OK with this. Anastrozole and protonix refilled.

## 2017-05-13 NOTE — Progress Notes (Signed)
[name]  Clinical Social [facility] phoned pt at home to check in and reassess needs. Pt reports she is "doing great and back at [facility]. I have glasses so I can see better and all is well." CSW explored if pt was interested in ACT referral or CST to help manage psychiatric medications as mismanagement has resulted in multiple hospital stays. Pt stated understanding is not open to these resources currently. She reports she feels she is managing well currently and does not "want one more thing to do". CSW encouraged pt to think about it as another layer of support to keep her well and "doing what she likes". Pt will reach out and call if she changes her mind. She appreciated call, but declines resources currently.   [facility] interventions:  Resource education   Loren Racer, [place], OSW-C Clinical Social Worker [place]  [place] Phone: [phone] Fax: [phone]

## 2017-05-17 ENCOUNTER — Telehealth: Payer: Self-pay | Admitting: Family Medicine

## 2017-05-17 NOTE — Telephone Encounter (Signed)
Caller calling to inform that pt cancelled her visit this morning due to having too many Dr appts and just starting work. Caller states that she will go next week to discharge pt.

## 2017-05-18 ENCOUNTER — Telehealth: Payer: Self-pay | Admitting: *Deleted

## 2017-05-18 NOTE — Telephone Encounter (Signed)
"  I need to know where to go for the echocardiogram tomorrow.  I usually have this at [facility] but I think they said [name]."  Echocardiogram is scheduled at [facility].  Instructed to enter the hospital entrance, proceed to registration where she can be directed to cardiac ultrasound.

## 2017-05-19 ENCOUNTER — Ambulatory Visit (HOSPITAL_COMMUNITY)
Admission: RE | Admit: 2017-05-19 | Discharge: 2017-05-19 | Disposition: A | Discharge status: Home / Self Care | Payer: Medicaid Other | Source: Ambulatory Visit | Attending: Adult Health | Admitting: Adult Health

## 2017-05-19 DIAGNOSIS — C50919 Malignant neoplasm of unspecified site of unspecified female breast: Secondary | ICD-10-CM | POA: Diagnosis not present

## 2017-05-19 NOTE — Progress Notes (Signed)
  Echocardiogram 2D Echocardiogram has been performed.  Matilde Bash 05/19/2017, 10:45 AM

## 2017-05-21 ENCOUNTER — Encounter: Payer: Self-pay | Admitting: Family Medicine

## 2017-05-21 ENCOUNTER — Ambulatory Visit: Payer: Medicaid Other | Attending: Family Medicine | Admitting: Family Medicine

## 2017-05-21 VITALS — BP 102/68 | HR 83 | Temp 97.5°F | Wt 121.2 lb

## 2017-05-21 DIAGNOSIS — K219 Gastro-esophageal reflux disease without esophagitis: Secondary | ICD-10-CM | POA: Diagnosis not present

## 2017-05-21 DIAGNOSIS — F319 Bipolar disorder, unspecified: Secondary | ICD-10-CM | POA: Diagnosis not present

## 2017-05-21 DIAGNOSIS — M722 Plantar fascial fibromatosis: Secondary | ICD-10-CM | POA: Diagnosis not present

## 2017-05-21 DIAGNOSIS — J3089 Other allergic rhinitis: Secondary | ICD-10-CM

## 2017-05-21 DIAGNOSIS — R51 Headache: Secondary | ICD-10-CM | POA: Diagnosis not present

## 2017-05-21 DIAGNOSIS — F3130 Bipolar disorder, current episode depressed, mild or moderate severity, unspecified: Secondary | ICD-10-CM

## 2017-05-21 DIAGNOSIS — C50919 Malignant neoplasm of unspecified site of unspecified female breast: Secondary | ICD-10-CM | POA: Diagnosis present

## 2017-05-21 DIAGNOSIS — Z79899 Other long term (current) drug therapy: Secondary | ICD-10-CM | POA: Insufficient documentation

## 2017-05-21 DIAGNOSIS — J302 Other seasonal allergic rhinitis: Secondary | ICD-10-CM | POA: Insufficient documentation

## 2017-05-21 DIAGNOSIS — M797 Fibromyalgia: Secondary | ICD-10-CM | POA: Insufficient documentation

## 2017-05-21 DIAGNOSIS — E876 Hypokalemia: Secondary | ICD-10-CM | POA: Diagnosis not present

## 2017-05-21 DIAGNOSIS — R519 Headache, unspecified: Secondary | ICD-10-CM | POA: Insufficient documentation

## 2017-05-21 DIAGNOSIS — C7951 Secondary malignant neoplasm of bone: Secondary | ICD-10-CM | POA: Insufficient documentation

## 2017-05-21 DIAGNOSIS — C7931 Secondary malignant neoplasm of brain: Secondary | ICD-10-CM | POA: Insufficient documentation

## 2017-05-21 DIAGNOSIS — G8929 Other chronic pain: Secondary | ICD-10-CM

## 2017-05-21 DIAGNOSIS — Z79811 Long term (current) use of aromatase inhibitors: Secondary | ICD-10-CM | POA: Insufficient documentation

## 2017-05-21 MED ORDER — GABAPENTIN 300 MG PO CAPS: 300.0000 mg | ORAL_CAPSULE | Freq: Two times a day (BID) | ORAL | 3 refills | Status: DC

## 2017-05-21 MED ORDER — DM-GUAIFENESIN ER 30-600 MG PO TB12: 1.0000 | ORAL_TABLET | Freq: Two times a day (BID) | ORAL | 1 refills | Status: DC

## 2017-05-21 MED ORDER — BUTALBITAL-APAP-CAFF-COD 50-325-40-30 MG PO CAPS: 1.0000 | ORAL_CAPSULE | Freq: Two times a day (BID) | ORAL | 1 refills | Status: DC | PRN

## 2017-05-21 MED ORDER — POTASSIUM CHLORIDE ER 10 MEQ PO CPCR: 10.0000 meq | ORAL_CAPSULE | Freq: Every day | ORAL | 3 refills | Status: DC

## 2017-05-21 NOTE — Progress Notes (Signed)
Subjective:  Patient ID: [name], female    DOB: Jan 03, 1961  Age: 56 y.o. 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 Most recent visit to oncology was on 05/11/17.  Last month she had a hospitalization for lithium toxicity and lithium was subsequently discontinued. She continues to take Lamictal, trazodone and gabapentin and is closely followed by psychiatry. She denies suicidal ideation or intents.  Today she is in good spirits and informs me she has returned to work part-time-she works in Teacher, adult education care. Her gait has improved and she is no longer wobbly when she walks. She does complain of headaches which radiate down her neck was previously on tramadol but this was discontinued during her hospitalization. She also complains of pain in the heel of both feet worse in the mornings and this has not been relieved by taking ibuprofen.  Past Medical History:  Diagnosis Date  . Alcohol abuse   . Anemia    during chemo  . Anxiety    At age 34  . Arthritis Dx 2010  . Bipolar disorder ([place])   . Cancer ([place])    breast mets to brain  . Chronic pain   . Complication of anesthesia   . Depression   . Fibromyalgia Dx 2005  . GERD (gastroesophageal reflux disease)   . Headache    hx  migraines  . Opiate dependence ([place])   . PONV (postoperative nausea and vomiting)   . Port-a-cath in place   . PTSD (post-traumatic stress disorder)     Past Surgical History:  Procedure Laterality Date  . APPLICATION OF CRANIAL NAVIGATION N/A 08/14/2016   Procedure: APPLICATION OF CRANIAL NAVIGATION;   Surgeon: Erline Levine, MD;  Location: [facility];  Service: Neurosurgery;  Laterality: N/A;  . BREAST RECONSTRUCTION Left    with silicone implant  . CRANIOTOMY N/A 08/14/2016   Procedure: CRANIOTOMY TUMOR EXCISION WITH Lucky Rathke;  Surgeon: Erline Levine, MD;  Location: [facility];  Service: Neurosurgery;  Laterality: N/A;  . FIBULA FRACTURE SURGERY Left   . MASTECTOMY Left   . RADIOLOGY WITH ANESTHESIA N/A 07/23/2016   Procedure: MRI OF BRAIN WITH AND WITHOUT;  Surgeon: Medication Radiologist, MD;  Location: [facility];  Service: Radiology;  Laterality: N/A;  . RADIOLOGY WITH ANESTHESIA N/A 09/08/2016   Procedure: MRI OF BRAIN WITH AND WITHOUT CONTRAST;  Surgeon: Medication Radiologist, MD;  Location: [facility];  Service: Radiology;  Laterality: N/A;  . RADIOLOGY WITH ANESTHESIA N/A 12/10/2016   Procedure: MRI OF BRAIN WITH AND WITHOUT;  Surgeon: Medication Radiologist, MD;  Location: [facility];  Service: Radiology;  Laterality: N/A;  . RADIOLOGY WITH ANESTHESIA N/A 03/02/2017   Procedure: MRI of BRAIN W and W/OUT CONTRAST;  Surgeon: Medication Radiologist, MD;  Location: [facility];  Service: Radiology;  Laterality: N/A;  . right power port placement Right     Allergies  Allergen Reactions  . Demerol Itching and Nausea And Vomiting  . Erythromycin Rash     Outpatient Medications Prior to Visit  Medication Sig Dispense Refill  . anastrozole (ARIMIDEX) 1 MG tablet Take 1 tablet (1 mg total) by mouth daily. [place]  tablet 1  . Biotin 1 MG CAPS Take 1 mg by mouth daily.    . cholecalciferol (VITAMIN D) 1000 units tablet Take 1,000 Units by mouth daily.    . fluticasone (FLONASE) 50 MCG/ACT nasal spray Place 2 sprays into both nostrils daily. 16 g 0  . ibuprofen (ADVIL,MOTRIN) 800 MG tablet Take 1 tablet (800 mg total) by mouth 3 (three) times daily. 90 tablet 0  . lamoTRIgine (LAMICTAL) 25 MG tablet Take 1 tablet (25 mg total) by mouth 2 (two) times daily. 60 tablet 0  . loratadine (CLARITIN) 10 MG tablet Take  1 tablet (10 mg total) by mouth daily. 30 tablet 11  . lurasidone (LATUDA) 40 MG TABS tablet Take 1 tablet (40 mg total) by mouth daily with breakfast. 30 tablet 0  . ondansetron (ZOFRAN) 8 MG tablet Take 1 tablet (8 mg total) by mouth every 8 (eight) hours as needed for nausea or vomiting. 30 tablet 0  . pantoprazole (PROTONIX) 40 MG tablet Take 1 tablet (40 mg total) by mouth daily. 30 tablet 3  . polyethylene glycol (MIRALAX / GLYCOLAX) packet Take 17 g by mouth daily as needed for mild constipation.    . traZODone (DESYREL) 100 MG tablet Take 1 tablet (100 mg total) by mouth at bedtime. 30 tablet 0  . gabapentin (NEURONTIN) 300 MG capsule Take 1 capsule (300 mg total) by mouth 2 (two) times daily. 60 capsule 0  . potassium chloride (MICRO-K) 10 MEQ CR capsule Take 1 capsule (10 mEq total) by mouth daily. 30 capsule 3  . hydrOXYzine (ATARAX/VISTARIL) 50 MG tablet Take 1 tablet (50 mg total) by mouth at bedtime as needed (for sleep). (Patient not taking: Reported on 05/11/2017) 30 tablet 0   No facility-administered medications prior to visit.     ROS Review of Systems  Constitutional: Negative for activity change, appetite change and fatigue.  HENT: Negative for congestion, sinus pressure and sore throat.   Eyes: Negative for visual disturbance.  Respiratory: Negative for cough, chest tightness, shortness of breath and wheezing.   Cardiovascular: Negative for chest pain and palpitations.  Gastrointestinal: Negative for abdominal distention, abdominal pain and constipation.  Endocrine: Negative for polydipsia.  Genitourinary: Negative for dysuria and frequency.  Musculoskeletal:       See hpi  Skin: Negative for rash.  Neurological: Positive for headaches. Negative for tremors, light-headedness and numbness.  Hematological: Does not bruise/bleed easily.  Psychiatric/Behavioral: Negative for agitation and behavioral problems.    Objective:  BP 102/68   Pulse 83   Temp 97.5 F (36.4  C) (Oral)   Wt 121 lb 3.2 oz (55 kg)   SpO2 96%   BMI 20.17 kg/m   BP/Weight 05/21/2017 3/64/6803 12/31/2246  Systolic BP 250 037 048  Diastolic BP 68 66 76  Wt. (Lbs) 121.2 126.8 123.9  BMI 20.17 21.1 -  Some encounter information is confidential and restricted. Go to Review Flowsheets activity to see all data.       Physical Exam  Constitutional: She is oriented to person, place, and time. She appears well-developed and well-nourished.  HENT:  Right Ear: External ear normal.  Left Ear: External ear normal.  Mouth/Throat: Oropharynx is clear and moist.  Cardiovascular: Normal rate, normal heart sounds and intact distal pulses.   No murmur heard. Pulmonary/Chest: Effort normal and breath sounds normal. She has no wheezes. She has no rales. She exhibits no tenderness.  Abdominal: Soft. Bowel sounds are normal. She exhibits no distension and no mass.  There is no tenderness.  Musculoskeletal: Normal range of motion. She exhibits no tenderness (no tenderness on palpation of both heels).  Neurological: She is alert and oriented to person, place, and time. Coordination normal.  Skin: Skin is warm and dry.  Psychiatric: She has a normal mood and affect.     CMP Latest Ref Rng & Units 05/11/2017 04/27/2017 04/26/2017  Glucose 70 - 140 mg/dl 89 83 80  BUN 7.0 - 26.0 mg/dL 9.1 6 10   Creatinine 0.6 - 1.1 mg/dL 0.9 0.68 0.78  Sodium 136 - 145 mEq/L 145 142 141  Potassium 3.5 - 5.1 mEq/L 4.0 3.7 3.9  Chloride 101 - 111 mmol/L - 114(H) 111  CO2 22 - 29 mEq/L 27 22 24   Calcium 8.4 - 10.4 mg/dL 9.6 8.4(L) 8.9  Total Protein 6.4 - 8.3 g/dL 6.4 5.5(L) 5.8(L)  Total Bilirubin 0.20 - 1.20 mg/dL 0.22 0.4 0.5  Alkaline Phos 40 - 150 U/L 48 43 46  AST 5 - 34 U/L 14 14(L) 16  ALT 0 - 55 U/L 10 11(L) 10(L)    Assessment & Plan:   1. 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 Metastatic breast cancer ([place]) Metastasis to the brain and bone Continue Arimidex and Herceptin Keep appointment with cancer Center  3. Bipolar I disorder, most recent episode depressed ([place]) This is the best I have seen her-she seems in good spirits Continue trazodone, hydroxyzine, Lamictal, Latuda Followed by Psych - Dr Darleene Cleaver  4. Plantar fasciitis No relief with NSAIDs She will need cortisone injections - Ambulatory referral to Podiatry  5. Hypokalemia Last potassium was 4.0 - potassium chloride (MICRO-K) 10 MEQ CR capsule; Take 1 capsule (10 mEq total) by mouth daily.  Dispense: 30 capsule; Refill: 3  6. Chronic nonintractable headache, unspecified headache type No relief with ibuprofen Holding off on tramadol which was discontinued during hospitalization  - butalbital-acetaminophen-caffeine (FIORICET/CODEINE) 50-325-40-30 MG capsule; Take 1 capsule by mouth every 12 (twelve) hours as needed for headache.  Dispense: 40 capsule; Refill: 1   Meds ordered this encounter  Medications  . potassium chloride (MICRO-K) 10 MEQ CR capsule    Sig: Take 1 capsule (10 mEq total) by mouth daily.    Dispense:  30 capsule    Refill:  3  . gabapentin (NEURONTIN) 300 MG capsule    Sig: Take 1 capsule (300 mg total) by mouth 2 (two) times daily.    Dispense:  60 capsule    Refill:  3  . butalbital-acetaminophen-caffeine (FIORICET/CODEINE) 50-325-40-30 MG capsule    Sig: Take 1 capsule by mouth every 12 (twelve) hours as needed for headache.    Dispense:  40 capsule    Refill:  1    Follow-up: Return in about 3 months (around 08/21/2017) for follow up of chronic medical conditions.   Arnoldo Morale MD

## 2017-05-21 NOTE — Patient Instructions (Signed)

## 2017-05-27 ENCOUNTER — Telehealth: Payer: Self-pay

## 2017-05-27 MED ORDER — POLYETHYLENE GLYCOL 3350 17 G PO PACK: 17.0000 g | PACK | Freq: Every day | ORAL | 1 refills | Status: DC | PRN

## 2017-05-27 NOTE — Telephone Encounter (Signed)
Pt called stating she is having problems with constipation and is stepping up her miralax from every other day to daily. She is requesting rx sent to pharmacy.  Done.

## 2017-05-28 ENCOUNTER — Ambulatory Visit: Payer: Self-pay

## 2017-05-28 ENCOUNTER — Other Ambulatory Visit: Payer: Self-pay

## 2017-05-28 ENCOUNTER — Ambulatory Visit: Payer: Self-pay | Admitting: Oncology

## 2017-05-28 ENCOUNTER — Telehealth (HOSPITAL_COMMUNITY): Payer: Self-pay | Admitting: Vascular Surgery

## 2017-05-28 NOTE — Telephone Encounter (Signed)
Spoke to pt to schedule Sept appt w/ echo , she wants to be called back next week to schedule

## 2017-06-03 ENCOUNTER — Telehealth: Payer: Self-pay | Admitting: *Deleted

## 2017-06-03 NOTE — Telephone Encounter (Signed)
This RN spoke with pt per her VM requesting a return call due to ongoing constipation.  Per phone discussion [name] states her bowels have not been moving as well despite use of increased Miralax.  Per inquiry - [name] states she has not increased her fluid intake " except I have drinking a lot of energy drinks "  She states her stomach is " waking up, growling and such "   This RN discussed with pt need to increase fluid intake with miralax - and decrease energy drinks.  [name] verified she is taking the fioricet with codeine daily- with this RN informing her aspect of codeine causing constipation. ( noted ordered by her primary MD at the [facility] per visit 05/21/2017 )  [name] states she will stop the fioricet and energy drinks. She will increase her fluid intake.  If she does not have a BM by tomorrow she will contact her primary MD at the [facility] office.

## 2017-06-04 ENCOUNTER — Ambulatory Visit: Payer: Self-pay | Admitting: Family Medicine

## 2017-06-04 ENCOUNTER — Other Ambulatory Visit: Payer: Self-pay | Admitting: Oncology

## 2017-06-04 MED ORDER — LACTULOSE 10 GM/15ML PO SOLN: 10.0000 g | Freq: Two times a day (BID) | ORAL | 1 refills | Status: DC | PRN

## 2017-06-04 NOTE — Telephone Encounter (Signed)
Prescription for lactulose sent to her pharmacy as needed for constipation.

## 2017-06-04 NOTE — Addendum Note (Signed)
Addended by: Arnoldo Morale on: 06/04/2017 01:27 PM   Modules accepted: Orders

## 2017-06-08 ENCOUNTER — Ambulatory Visit (HOSPITAL_BASED_OUTPATIENT_CLINIC_OR_DEPARTMENT_OTHER): Payer: Medicaid Other

## 2017-06-08 ENCOUNTER — Other Ambulatory Visit (HOSPITAL_BASED_OUTPATIENT_CLINIC_OR_DEPARTMENT_OTHER): Payer: Medicaid Other

## 2017-06-08 VITALS — BP 101/54 | HR 60 | Temp 98.2°F | Resp 20

## 2017-06-08 DIAGNOSIS — C50312 Malignant neoplasm of lower-inner quadrant of left female breast: Secondary | ICD-10-CM

## 2017-06-08 DIAGNOSIS — C7951 Secondary malignant neoplasm of bone: Secondary | ICD-10-CM

## 2017-06-08 DIAGNOSIS — C773 Secondary and unspecified malignant neoplasm of axilla and upper limb lymph nodes: Secondary | ICD-10-CM

## 2017-06-08 DIAGNOSIS — C7931 Secondary malignant neoplasm of brain: Secondary | ICD-10-CM | POA: Diagnosis not present

## 2017-06-08 DIAGNOSIS — Z5112 Encounter for antineoplastic immunotherapy: Secondary | ICD-10-CM

## 2017-06-08 LAB — COMPREHENSIVE METABOLIC PANEL
ALT: 11 U/L (ref 0–55)
AST: 14 U/L (ref 5–34)
Albumin: 4.2 g/dL (ref 3.5–5.0)
Alkaline Phosphatase: 59 U/L (ref 40–150)
Anion Gap: 7 mEq/L (ref 3–11)
BUN: 9.8 mg/dL (ref 7.0–26.0)
CO2: 29 mEq/L (ref 22–29)
Calcium: 10.3 mg/dL (ref 8.4–10.4)
Chloride: 105 mEq/L (ref 98–109)
Creatinine: 1 mg/dL (ref 0.6–1.1)
EGFR: 61 mL/min/{1.73_m2} — ABNORMAL LOW (ref >90)
Glucose: 105 mg/dl (ref 70–140)
Potassium: 4 mEq/L (ref 3.5–5.1)
Sodium: 141 mEq/L (ref 136–145)
Total Bilirubin: 0.23 mg/dL (ref 0.20–1.20)
Total Protein: 6.7 g/dL (ref 6.4–8.3)

## 2017-06-08 LAB — CBC WITH DIFFERENTIAL/PLATELET
BASO%: 0.5 % (ref 0.0–2.0)
Basophils Absolute: 0 10*3/uL (ref 0.0–0.1)
EOS%: 0.8 % (ref 0.0–7.0)
Eosinophils Absolute: 0 10*3/uL (ref 0.0–0.5)
HCT: 38 % (ref 34.8–46.6)
HGB: 12.7 g/dL (ref 11.6–15.9)
LYMPH%: 21.6 % (ref 14.0–49.7)
MCH: 32 pg (ref 25.1–34.0)
MCHC: 33.4 g/dL (ref 31.5–36.0)
MCV: 95.7 fL (ref 79.5–101.0)
MONO#: 0.2 10*3/uL (ref 0.1–0.9)
MONO%: 6.2 % (ref 0.0–14.0)
NEUT#: 2.6 10*3/uL (ref 1.5–6.5)
NEUT%: 70.9 % (ref 38.4–76.8)
Platelets: 154 10*3/uL (ref 145–400)
RBC: 3.97 10*6/uL (ref 3.70–5.45)
RDW: 13.3 % (ref 11.2–14.5)
WBC: 3.7 10*3/uL — ABNORMAL LOW (ref 3.9–10.3)
lymph#: 0.8 10*3/uL — ABNORMAL LOW (ref 0.9–3.3)

## 2017-06-08 MED ORDER — SODIUM CHLORIDE 0.9% FLUSH
10.0000 mL | INTRAVENOUS | Status: DC | PRN
  Administered 2017-06-08: 10 mL
  Filled 2017-06-08: qty 10

## 2017-06-08 MED ORDER — TRASTUZUMAB CHEMO 150 MG IV SOLR
450.0000 mg | Freq: Once | INTRAVENOUS | Status: AC
  Administered 2017-06-08: 450 mg via INTRAVENOUS
  Filled 2017-06-08: qty 21.43

## 2017-06-08 MED ORDER — DIPHENHYDRAMINE HCL 25 MG PO CAPS: 25.0000 mg | ORAL_CAPSULE | Freq: Once | ORAL | Status: DC

## 2017-06-08 MED ORDER — DENOSUMAB 120 MG/1.7ML ~~LOC~~ SOLN
120.0000 mg | Freq: Once | SUBCUTANEOUS | Status: AC
  Administered 2017-06-08: 120 mg via SUBCUTANEOUS
  Filled 2017-06-08: qty 1.7

## 2017-06-08 MED ORDER — HEPARIN SOD (PORK) LOCK FLUSH 100 UNIT/ML IV SOLN
500.0000 [IU] | Freq: Once | INTRAVENOUS | Status: AC | PRN
  Administered 2017-06-08: 500 [IU]
  Filled 2017-06-08: qty 5

## 2017-06-08 MED ORDER — ACETAMINOPHEN 325 MG PO TABS: 650.0000 mg | ORAL_TABLET | Freq: Once | ORAL | Status: DC

## 2017-06-08 MED ORDER — SODIUM CHLORIDE 0.9 % IV SOLN
Freq: Once | INTRAVENOUS | Status: AC
  Administered 2017-06-08: 12:00:00 via INTRAVENOUS

## 2017-06-08 NOTE — Patient Instructions (Addendum)
[facility] Discharge Instructions for Patients Receiving Chemotherapy  Today you received the following chemotherapy agents Herceptin  To help prevent nausea and vomiting after your treatment, we encourage you to take your nausea medication    If you develop nausea and vomiting that is not controlled by your nausea medication, call the clinic.   BELOW ARE SYMPTOMS THAT SHOULD BE REPORTED IMMEDIATELY:  *FEVER GREATER THAN 100.5 F  *CHILLS WITH OR WITHOUT FEVER  NAUSEA AND VOMITING THAT IS NOT CONTROLLED WITH YOUR NAUSEA MEDICATION  *UNUSUAL SHORTNESS OF BREATH  *UNUSUAL BRUISING OR BLEEDING  TENDERNESS IN MOUTH AND THROAT WITH OR WITHOUT PRESENCE OF ULCERS  *URINARY PROBLEMS  *BOWEL PROBLEMS  UNUSUAL RASH Items with * indicate a potential emergency and should be followed up as soon as possible.  Feel free to call the clinic you have any questions or concerns. The clinic phone number is [phone].  Please show the [place] at check-in to the Emergency Department and triage nurse.  Denosumab injection What is this medicine? DENOSUMAB (den oh sue mab) slows bone breakdown. Prolia is used to treat osteoporosis in women after menopause and in men. Delton See is used to treat a high calcium level due to cancer and to prevent bone fractures and other bone problems caused by multiple myeloma or cancer bone metastases. Delton See is also used to treat giant cell tumor of the bone. This medicine may be used for other purposes; ask your health care provider or pharmacist if you have questions. COMMON BRAND NAME(S): Prolia, XGEVA What should I tell my health care provider before I take this medicine? They need to know if you have any of these conditions: -dental disease -having surgery or tooth extraction -infection -kidney disease -low levels of calcium or Vitamin D in the blood -malnutrition -on hemodialysis -skin conditions or sensitivity -thyroid or  parathyroid disease -an unusual reaction to denosumab, other medicines, foods, dyes, or preservatives -pregnant or trying to get pregnant -breast-feeding How should I use this medicine? This medicine is for injection under the skin. It is given by a health care professional in a hospital or clinic setting. If you are getting Prolia, a special MedGuide will be given to you by the pharmacist with each prescription and refill. Be sure to read this information carefully each time. For Prolia, talk to your pediatrician regarding the use of this medicine in children. Special care may be needed. For Delton See, talk to your pediatrician regarding the use of this medicine in children. While this drug may be prescribed for children as young as 13 years for selected conditions, precautions do apply. Overdosage: If you think you have taken too much of this medicine contact a poison control center or emergency room at once. NOTE: This medicine is only for you. Do not share this medicine with others. What if I miss a dose? It is important not to miss your dose. Call your doctor or health care professional if you are unable to keep an appointment. What may interact with this medicine? Do not take this medicine with any of the following medications: -other medicines containing denosumab This medicine may also interact with the following medications: -medicines that lower your chance of fighting infection -steroid medicines like prednisone or cortisone This list may not describe all possible interactions. Give your health care provider a list of all the medicines, herbs, non-prescription drugs, or dietary supplements you use. Also tell them if you smoke, drink alcohol, or use illegal drugs. Some  items may interact with your medicine. What should I watch for while using this medicine? Visit your doctor or health care professional for regular checks on your progress. Your doctor or health care professional may order  blood tests and other tests to see how you are doing. Call your doctor or health care professional for advice if you get a fever, chills or sore throat, or other symptoms of a cold or flu. Do not treat yourself. This drug may decrease your body's ability to fight infection. Try to avoid being around people who are sick. You should make sure you get enough calcium and vitamin D while you are taking this medicine, unless your doctor tells you not to. Discuss the foods you eat and the vitamins you take with your health care professional. See your dentist regularly. Brush and floss your teeth as directed. Before you have any dental work done, tell your dentist you are receiving this medicine. Do not become pregnant while taking this medicine or for 5 months after stopping it. Talk with your doctor or health care professional about your birth control options while taking this medicine. Women should inform their doctor if they wish to become pregnant or think they might be pregnant. There is a potential for serious side effects to an unborn child. Talk to your health care professional or pharmacist for more information. What side effects may I notice from receiving this medicine? Side effects that you should report to your doctor or health care professional as soon as possible: -allergic reactions like skin rash, itching or hives, swelling of the face, lips, or tongue -bone pain -breathing problems -dizziness -jaw pain, especially after dental work -redness, blistering, peeling of the skin -signs and symptoms of infection like fever or chills; cough; sore throat; pain or trouble passing urine -signs of low calcium like fast heartbeat, muscle cramps or muscle pain; pain, tingling, numbness in the hands or feet; seizures -unusual bleeding or bruising -unusually weak or tired Side effects that usually do not require medical attention (report to your doctor or health care professional if they continue or are  bothersome): -constipation -diarrhea -headache -joint pain -loss of appetite -muscle pain -runny nose -tiredness -upset stomach This list may not describe all possible side effects. Call your doctor for medical advice about side effects. You may report side effects to FDA at 1-800-FDA-1088. Where should I keep my medicine? This medicine is only given in a clinic, doctor's office, or other health care setting and will not be stored at home. NOTE: This sheet is a summary. It may not cover all possible information. If you have questions about this medicine, talk to your doctor, pharmacist, or health care provider.  2018 Elsevier/Gold Standard (2016-12-08 19:17:21)

## 2017-06-09 LAB — CANCER ANTIGEN 27.29: CA 27.29: 9.7 U/mL (ref 0.0–38.6)

## 2017-06-09 LAB — LITHIUM LEVEL: Lithium: <0.1 mmol/L — ABNORMAL LOW (ref 0.6–1.2)

## 2017-06-14 ENCOUNTER — Other Ambulatory Visit: Payer: Self-pay | Admitting: Oncology

## 2017-06-16 ENCOUNTER — Ambulatory Visit (INDEPENDENT_AMBULATORY_CARE_PROVIDER_SITE_OTHER): Payer: Medicaid Other | Admitting: Podiatry

## 2017-06-16 ENCOUNTER — Ambulatory Visit (INDEPENDENT_AMBULATORY_CARE_PROVIDER_SITE_OTHER): Payer: Medicaid Other

## 2017-06-16 ENCOUNTER — Encounter: Payer: Self-pay | Admitting: Podiatry

## 2017-06-16 ENCOUNTER — Ambulatory Visit: Payer: Medicaid Other

## 2017-06-16 DIAGNOSIS — M722 Plantar fascial fibromatosis: Secondary | ICD-10-CM | POA: Diagnosis not present

## 2017-06-16 MED ORDER — DICLOFENAC SODIUM 75 MG PO TBEC: 75.0000 mg | DELAYED_RELEASE_TABLET | Freq: Two times a day (BID) | ORAL | 0 refills | Status: DC

## 2017-06-16 MED ORDER — BETAMETHASONE SOD PHOS & ACET 6 (3-3) MG/ML IJ SUSP: 3.0000 mg | Freq: Once | INTRAMUSCULAR | Status: DC

## 2017-06-16 NOTE — Progress Notes (Signed)
   Subjective: Patient presents today for pain and tenderness in the feet bilaterally. Patient states the foot pain has been hurting for several weeks now. Patient states that it hurts in the mornings with the first steps out of bed. Patient presents today for further treatment and evaluation  Objective: Physical Exam General: The patient is alert and oriented x3 in no acute distress.  Dermatology: Skin is warm, dry and supple bilateral lower extremities. Negative for open lesions or macerations bilateral.   Vascular: Dorsalis Pedis and Posterior Tibial pulses palpable bilateral.  Capillary fill time is immediate to all digits.  Neurological: Epicritic and protective threshold intact bilateral.   Musculoskeletal: Tenderness to palpation at the medial calcaneal tubercale and through the insertion of the plantar fascia of the bilateral feet. All other joints range of motion within normal limits bilateral. Strength 5/5 in all groups bilateral.   Radiographic exam: Normal osseous mineralization. Joint spaces preserved. No fracture/dislocation/boney destruction. Calcaneal spur present with mild thickening of plantar fascia bilateral. No other soft tissue abnormalities or radiopaque foreign bodies.   Assessment: 1. plantar fasciitis bilateral feet  Plan of Care:  1. Patient evaluated. Xrays reviewed.   2. Injection of 0.5cc Celestone soluspan injected into the bilateral heels.  3. Rx for Diclofenac 75mg  PO BID ordered for patient. 4. Recommend over-the-counter arch supports in good supportive shoe gear 5. Return to clinic when necessary  Edrick Kins, DPM [facility]  Dr. Edrick Kins, DPM    [address], [place] [address]                Office [phone]  Fax [phone]

## 2017-06-30 ENCOUNTER — Other Ambulatory Visit: Payer: Self-pay | Admitting: Radiation Therapy

## 2017-06-30 DIAGNOSIS — C50919 Malignant neoplasm of unspecified site of unspecified female breast: Secondary | ICD-10-CM

## 2017-07-02 ENCOUNTER — Ambulatory Visit (HOSPITAL_COMMUNITY)
Admission: RE | Admit: 2017-07-02 | Discharge: 2017-07-02 | Disposition: A | Discharge status: Home / Self Care | Payer: Medicaid Other | Source: Ambulatory Visit | Attending: Oncology | Admitting: Oncology

## 2017-07-02 ENCOUNTER — Encounter (HOSPITAL_COMMUNITY): Payer: Self-pay

## 2017-07-02 ENCOUNTER — Encounter (HOSPITAL_COMMUNITY)
Admission: RE | Admit: 2017-07-02 | Discharge: 2017-07-02 | Disposition: A | Discharge status: Home / Self Care | Payer: Medicaid Other | Source: Ambulatory Visit | Attending: Oncology | Admitting: Oncology

## 2017-07-02 DIAGNOSIS — C50312 Malignant neoplasm of lower-inner quadrant of left female breast: Secondary | ICD-10-CM

## 2017-07-02 DIAGNOSIS — C50919 Malignant neoplasm of unspecified site of unspecified female breast: Secondary | ICD-10-CM

## 2017-07-02 DIAGNOSIS — C7951 Secondary malignant neoplasm of bone: Secondary | ICD-10-CM

## 2017-07-02 DIAGNOSIS — C7931 Secondary malignant neoplasm of brain: Secondary | ICD-10-CM

## 2017-07-02 MED ORDER — TECHNETIUM TC 99M MEDRONATE IV KIT
19.7000 | PACK | Freq: Once | INTRAVENOUS | Status: AC | PRN
  Administered 2017-07-02: 19.7 via INTRAVENOUS

## 2017-07-02 MED ORDER — IOPAMIDOL (ISOVUE-300) INJECTION 61%
INTRAVENOUS | Status: AC
  Filled 2017-07-02: qty 75

## 2017-07-02 MED ORDER — IOPAMIDOL (ISOVUE-300) INJECTION 61%
75.0000 mL | Freq: Once | INTRAVENOUS | Status: AC | PRN
  Administered 2017-07-02: 75 mL via INTRAVENOUS

## 2017-07-06 ENCOUNTER — Other Ambulatory Visit (HOSPITAL_BASED_OUTPATIENT_CLINIC_OR_DEPARTMENT_OTHER): Payer: Medicaid Other

## 2017-07-06 ENCOUNTER — Ambulatory Visit (HOSPITAL_BASED_OUTPATIENT_CLINIC_OR_DEPARTMENT_OTHER): Payer: Medicaid Other

## 2017-07-06 ENCOUNTER — Ambulatory Visit (HOSPITAL_BASED_OUTPATIENT_CLINIC_OR_DEPARTMENT_OTHER): Payer: Medicaid Other | Admitting: Oncology

## 2017-07-06 VITALS — BP 101/63 | HR 62 | Temp 97.8°F | Resp 18 | Ht 65.0 in | Wt 125.0 lb

## 2017-07-06 DIAGNOSIS — F17200 Nicotine dependence, unspecified, uncomplicated: Secondary | ICD-10-CM | POA: Diagnosis not present

## 2017-07-06 DIAGNOSIS — Z5112 Encounter for antineoplastic immunotherapy: Secondary | ICD-10-CM | POA: Diagnosis present

## 2017-07-06 DIAGNOSIS — C50919 Malignant neoplasm of unspecified site of unspecified female breast: Secondary | ICD-10-CM

## 2017-07-06 DIAGNOSIS — C7951 Secondary malignant neoplasm of bone: Secondary | ICD-10-CM

## 2017-07-06 DIAGNOSIS — C50312 Malignant neoplasm of lower-inner quadrant of left female breast: Secondary | ICD-10-CM

## 2017-07-06 DIAGNOSIS — F319 Bipolar disorder, unspecified: Secondary | ICD-10-CM | POA: Diagnosis not present

## 2017-07-06 DIAGNOSIS — C773 Secondary and unspecified malignant neoplasm of axilla and upper limb lymph nodes: Secondary | ICD-10-CM | POA: Diagnosis not present

## 2017-07-06 DIAGNOSIS — C7931 Secondary malignant neoplasm of brain: Secondary | ICD-10-CM

## 2017-07-06 LAB — COMPREHENSIVE METABOLIC PANEL
ALT: 15 U/L (ref 0–55)
AST: 17 U/L (ref 5–34)
Albumin: 4.2 g/dL (ref 3.5–5.0)
Alkaline Phosphatase: 52 U/L (ref 40–150)
Anion Gap: 9 mEq/L (ref 3–11)
BUN: 13.6 mg/dL (ref 7.0–26.0)
CO2: 27 mEq/L (ref 22–29)
Calcium: 9.6 mg/dL (ref 8.4–10.4)
Chloride: 102 mEq/L (ref 98–109)
Creatinine: 1 mg/dL (ref 0.6–1.1)
EGFR: 60 mL/min/{1.73_m2} — ABNORMAL LOW (ref >90)
Glucose: 93 mg/dl (ref 70–140)
Potassium: 4.2 mEq/L (ref 3.5–5.1)
Sodium: 138 mEq/L (ref 136–145)
Total Bilirubin: 0.28 mg/dL (ref 0.20–1.20)
Total Protein: 6.5 g/dL (ref 6.4–8.3)

## 2017-07-06 LAB — CBC WITH DIFFERENTIAL/PLATELET
BASO%: 0.7 % (ref 0.0–2.0)
Basophils Absolute: 0 10*3/uL (ref 0.0–0.1)
EOS%: 1.1 % (ref 0.0–7.0)
Eosinophils Absolute: 0 10*3/uL (ref 0.0–0.5)
HCT: 38.7 % (ref 34.8–46.6)
HGB: 12.8 g/dL (ref 11.6–15.9)
LYMPH%: 26.4 % (ref 14.0–49.7)
MCH: 32.1 pg (ref 25.1–34.0)
MCHC: 33.1 g/dL (ref 31.5–36.0)
MCV: 96.9 fL (ref 79.5–101.0)
MONO#: 0.3 10*3/uL (ref 0.1–0.9)
MONO%: 7 % (ref 0.0–14.0)
NEUT#: 2.4 10*3/uL (ref 1.5–6.5)
NEUT%: 64.8 % (ref 38.4–76.8)
Platelets: 154 10*3/uL (ref 145–400)
RBC: 3.99 10*6/uL (ref 3.70–5.45)
RDW: 13.5 % (ref 11.2–14.5)
WBC: 3.7 10*3/uL — ABNORMAL LOW (ref 3.9–10.3)
lymph#: 1 10*3/uL (ref 0.9–3.3)

## 2017-07-06 MED ORDER — ACETAMINOPHEN 325 MG PO TABS
650.0000 mg | ORAL_TABLET | Freq: Once | ORAL | Status: AC
  Administered 2017-07-06: 650 mg via ORAL

## 2017-07-06 MED ORDER — ACETAMINOPHEN 325 MG PO TABS
ORAL_TABLET | ORAL | Status: AC
  Filled 2017-07-06: qty 2

## 2017-07-06 MED ORDER — SODIUM CHLORIDE 0.9% FLUSH
10.0000 mL | INTRAVENOUS | Status: DC | PRN
  Administered 2017-07-06: 10 mL
  Filled 2017-07-06: qty 10

## 2017-07-06 MED ORDER — HEPARIN SOD (PORK) LOCK FLUSH 100 UNIT/ML IV SOLN
500.0000 [IU] | Freq: Once | INTRAVENOUS | Status: AC | PRN
  Administered 2017-07-06: 500 [IU]
  Filled 2017-07-06: qty 5

## 2017-07-06 MED ORDER — TRASTUZUMAB CHEMO 150 MG IV SOLR
450.0000 mg | Freq: Once | INTRAVENOUS | Status: AC
  Administered 2017-07-06: 450 mg via INTRAVENOUS
  Filled 2017-07-06: qty 21.43

## 2017-07-06 MED ORDER — SODIUM CHLORIDE 0.9 % IV SOLN
Freq: Once | INTRAVENOUS | Status: AC
  Administered 2017-07-06: 11:00:00 via INTRAVENOUS

## 2017-07-06 MED ORDER — DENOSUMAB 120 MG/1.7ML ~~LOC~~ SOLN
120.0000 mg | Freq: Once | SUBCUTANEOUS | Status: AC
  Administered 2017-07-06: 120 mg via SUBCUTANEOUS
  Filled 2017-07-06: qty 1.7

## 2017-07-06 MED ORDER — DIPHENHYDRAMINE HCL 25 MG PO CAPS
25.0000 mg | ORAL_CAPSULE | Freq: Once | ORAL | Status: DC
Start: 2017-07-06 — End: 2017-07-06

## 2017-07-06 NOTE — Progress Notes (Signed)
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 She continues on anastrozole with good tolerance. Hot flashes  are not a major issue. She does have significant vaginal dryness problems" no sex drive". She never developed the arthralgias or myalgias that many patients can experience on this medication. She obtains it at a good price.  She is currently receiving trastuzumab every 4 weeks. She tolerates that well. Her most recent echocardiogram was 05/19/2017 with an ejection fraction in the 55-60% range.  She receives [place] every 4 weeks as well. She has had no problems related to this.  Since the last visit here we have restage her with a CT scan of the chest and a bone scan. The bone scan is negative. The CT of the chest shows no evidence of active disease.  REVIEW OF SYSTEMS: [name] is now established with psychiatry and is tolerating her bipolar medications well. They are working quite well. 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 She has disability and has been able to get a car which makes her much more mobile. She is unfortunately going to tanning beds. She does continue to smoke. A detailed review of systems today was otherwise entirely negative  BREAST CANCER HISTORY: From the original intake note:  [name] was aware of a "lemon sized lump in" her left axilla for about a year before  bringing it to medical attention. By then she had developed left breast and left axillary swelling (June 2016). She presented to the local emergency room and had a chest CT scan 06/06/2015 which showed a nodule in the left breast measuring 0.9 cm and questionable left axillary adenopathy. She then proceeded to bilateral diagnostic mammography and left breast ultrasonography 06/19/2015. There were no prior films for comparison (last mammography 12 years prior).. The breast density was category C. Mammography showed in the left breast upper inner quadrant a 7 cm area including a small mass and significant pleomorphic calcifications. Ultrasonography defined the mass as measuring 1.2 cm. The left axilla appeared unremarkable. There was significant skin edema.  Biopsy of the left breast mass 06/19/2015 showed (SP [id number]) an invasive ductal carcinoma, grade 2, estrogen receptor 83% positive, progesterone receptor 26% positive, and HER-2 amplified by immunohistochemstry with a 3+ reading. The patient had biopsies of a separate area in the left breast August of the same year and this showed atypical ductal hyperplasia. (SP [id number]).  Accordingly after appropriate discussion on 08/21/2015 the patient proceeded to left mastectomy with left axillary sentinel lymph node sampling, which, since the lymph nodes were positive, extended to the procedure to left axillary lymph node dissection. The pathology (SP [id number]) showed an invasive ductal carcinoma, grade 3, measuring in excess of 9 cm. There were also skin satellites, not contiguous with the invasive carcinoma. Margins were clear and ample. There was evidence of lymphovascular invasion. A total of 15 lymph nodes were removed, including 5 sentinel lymph nodes, all of which were positive, so  that the final total was 14 out of 15 lymph nodes involved by tumor. There was evidence of extranodal extension. The final pathology was pT4b pN2a, stage IIIB  CA-27-29 and CEA  09/19/2015 were non-informative October 2016.  Unfortunately CT scans of the chest abdomen and pelvis 09/16/2015 showed bony metastases to the right scapula, left iliac crest, and also L4 and T-spine. There were questionable liver cysts which on repeat CT scan 03/02/2016 appear to be a little bit more well-defined, possibly a little larger. There were also some possible right upper lobe lung lesions.  Adjuvant treatment consisted of docetaxel, trastuzumab and pertuzumab, with the final (6th) docetaxel dose given 02/11/2016. She continues on trastuzumab and pertuzumab, with the 11th cycle given 05/05/2016. Echocardiogram 02/26/2016 showed an ejection fraction of 55%. She receives denosumab/Xgeva every 6 weeks.. She also receives radiation, started 06/09/201, to be completed 06/26/2016.  Her subsequent history is as detailed below   PAST MEDICAL HISTORY: Past Medical History:  Diagnosis Date  . Alcohol abuse   . Anemia    during chemo  . Anxiety    At age 39  . Arthritis Dx 2010  . Bipolar disorder ([place])   . Cancer (HCC)    breast  . Chronic pain   . Depression   . Fibromyalgia Dx 2005  . GERD (gastroesophageal reflux disease)   . Headache    hx  migraines  . Opiate dependence ([place])   . Port-a-cath in place   . PTSD (post-traumatic stress disorder)     PAST SURGICAL HISTORY: Past Surgical History:  Procedure Laterality Date  . APPLICATION OF CRANIAL NAVIGATION N/A 08/14/2016   Procedure: APPLICATION OF CRANIAL NAVIGATION;  Surgeon: Erline Levine, MD;  Location: [facility];  Service: Neurosurgery;  Laterality: N/A;  . BREAST RECONSTRUCTION Left    with silicone implant  . CRANIOTOMY N/A 08/14/2016   Procedure: CRANIOTOMY TUMOR EXCISION WITH Lucky Rathke;  Surgeon: Erline Levine, MD;  Location: [facility];  Service: Neurosurgery;  Laterality: N/A;  . FIBULA FRACTURE SURGERY    . MASTECTOMY Left   . RADIOLOGY WITH ANESTHESIA N/A 07/23/2016   Procedure: MRI OF BRAIN WITH AND WITHOUT;   Surgeon: Medication Radiologist, MD;  Location: [facility];  Service: Radiology;  Laterality: N/A;  . right power port placement Right     FAMILY HISTORY Family History  Problem Relation Age of Onset  . Diabetes Mother   . Bipolar disorder Mother   . CAD Father   The patient's father still living, age 59, in [place]. He had prostate cancer at some point in the past. The patient's mother died at age 50 from complications of diabetes. The patient had no brothers, 2 sisters. A paternal grandmother had lung cancer in the setting of tobacco abuse. There is no other history of cancer in the family to her knowledge  GYNECOLOGIC HISTORY:  No LMP recorded. Patient has had an ablation. Menarche approximately age 29. First live birth in 83. The patient is GX P2. She underwent endometrial ablation in 2016.  SOCIAL HISTORY:  The patient is originally from [place]. She has lived in [place] before but more recently was in [place]. She is back here because she could not afford her rent in [place]. She is living here and a temporary situation. She is divorced. HER-2 children are [name] who lives in [place] and works as a Development worker, community, and [name] who also lives in [place] and works as a Catering manager. The patient has a grandchild, Arelia Longest, 61 years old  as of July 2017, living in [place] with his mother. The patient has not established herself with a local church yet.    ADVANCED DIRECTIVES: Not in place; at the 06/03/2016 visit the patient was given the appropriate forms to complete and notarize at her discretion   HEALTH MAINTENANCE: Social History  Substance Use Topics  . Smoking status: Current Every Day Smoker    Packs/day: 0.50    Types: Cigarettes  . Smokeless tobacco: Never Used     Comment: Pt is on Chantix at present time  . Alcohol use Yes     Comment: no ETOH since 08/22/12     Colonoscopy:  PAP:  Bone density:   Allergies  Allergen Reactions  . Demerol Itching  and Nausea And Vomiting  . Erythromycin Rash    Can take zpak    Current Outpatient Prescriptions  Medication Sig Dispense Refill  . cetirizine (ZYRTEC) 10 MG tablet Take 1 tablet (10 mg total) by mouth daily. 30 tablet 11  . dexamethasone (DECADRON) 4 MG tablet Take 1 tablet (4 mg total) by mouth 2 (two) times daily. 60 tablet 0  . diphenoxylate-atropine (LOMOTIL) 2.5-0.025 MG tablet Take 2 tablets by mouth 4 (four) times daily as needed for diarrhea or loose stools. 40 tablet 0  . fluticasone (FLONASE) 50 MCG/ACT nasal spray Place 1 spray into the nose daily.    [name] gabapentin (NEURONTIN) 300 MG capsule Take 2 capsules (600 mg total) by mouth 3 (three) times daily. 300 mg at night for one day, twice daily for one day, then three times daily 180 capsule 2  . hyaluronate sodium (RADIAPLEXRX) GEL Apply 1 application topically.    [name] HYDROcodone-acetaminophen (NORCO) 7.5-325 MG tablet Take 1-2 tablets by mouth every 6 (six) hours as needed for severe pain (give one tab, then if other tab is needed, may give 30 min later.). 60 tablet 0  . ibuprofen (ADVIL,MOTRIN) 200 MG tablet Take 800 mg by mouth every 6 (six) hours as needed for mild pain.    [name] lamoTRIgine (LAMICTAL) 100 MG tablet Take 1.5 tablets (150 mg total) by mouth 2 (two) times daily. 60 tablet 3  . lidocaine-prilocaine (EMLA) cream Apply 1 application topically as needed. 30 g 0  . lithium carbonate 300 MG capsule Take 1 capsule (300 mg total) by mouth 2 (two) times daily with a meal. 60 capsule 3  . Loperamide HCl (IMODIUM PO) Take 1 tablet by mouth daily as needed (diarhea).    . LORazepam (ATIVAN) 0.5 MG tablet Take 1 tablet (0.5 mg total) by mouth every 8 (eight) hours as needed for anxiety. 30 tablet 1  . ondansetron (ZOFRAN) 8 MG tablet Take 1 tablet (8 mg total) by mouth every 8 (eight) hours as needed for nausea or vomiting. 30 tablet 1  . pantoprazole (PROTONIX) 40 MG tablet Take 1 tablet (40 mg total) by mouth daily. 30 tablet 3    . polyethylene glycol powder (GLYCOLAX/MIRALAX) powder Take 17 g by mouth 2 (two) times daily as needed for mild constipation. (Patient not taking: Reported on 08/13/2016) 3350 g 3  . potassium chloride (K-DUR) 10 MEQ tablet Take 1 tablet (10 mEq total) by mouth daily. 30 tablet 3  . prochlorperazine (COMPAZINE) 10 MG tablet Take 10 mg by mouth daily as needed for nausea.     [name] tiZANidine (ZANAFLEX) 4 MG tablet Take 1 tablet (4 mg total) by mouth daily. 30 tablet 0  . traMADol (ULTRAM) 50 MG tablet Take 1 tablet (  50 mg total) by mouth every 6 (six) hours as needed. 60 tablet 0  . trazodone (DESYREL) 300 MG tablet Take 1 tablet (300 mg total) by mouth at bedtime. 30 tablet 3  . triamcinolone cream (KENALOG) 0.5 % Apply 1 application topically daily as needed.  1  . trolamine salicylate (ASPERCREME) 10 % cream Apply 1 application topically as needed for muscle pain. Reported on 06/01/2016    . varenicline (CHANTIX) 0.5 MG tablet Take 1 tablet (0.5 mg total) by mouth daily. 30 tablet 3  . Vitamin D, Cholecalciferol, 1000 units CAPS Take 1,000 Units by mouth daily.     No current facility-administered medications for this visit.     OBJECTIVE: Middle-aged white woman who appears well  Vitals:   07/06/17 0953  BP: 101/63  Pulse: 62  Resp: 18  Temp: 97.8 F (36.6 C)  TempSrc: Oral  SpO2: 100%  Weight: 125 lb (56.7 kg)  Height: 5' 5"  (1.651 m)  Body mass index is 20.8 kg/m.  Sclerae unicteric, pupils round and equal Oropharynx clear and moist No cervical or supraclavicular adenopathy Lungs no rales or rhonchi Heart regular rate and rhythm Abd soft, nontender, positive bowel sounds MSK no focal spinal tenderness, no upper extremity lymphedema Neuro: nonfocal, well oriented, appropriate affect Breasts: The right breast is benign. The left breast is status post mastectomy with implant in place. There is no evidence of local recurrence. Both axillae are benign.   LAB RESULTS:  CMP      Component Value Date/Time   NA 140 08/13/2016 0926   NA 139 07/28/2016 1118   K 4.3 08/13/2016 0926   K 4.1 07/28/2016 1118   CL 107 08/13/2016 0926   CO2 25 08/13/2016 0926   CO2 26 07/28/2016 1118   GLUCOSE 96 08/13/2016 0926   GLUCOSE 91 07/28/2016 1118   BUN 8 08/13/2016 0926   BUN 8.6 07/28/2016 1118   CREATININE 0.94 08/13/2016 0926   CREATININE 0.8 07/28/2016 1118   CALCIUM 9.2 08/13/2016 0926   CALCIUM 9.7 07/28/2016 1118   PROT 6.7 07/28/2016 1118   ALBUMIN 4.0 07/28/2016 1118   AST 18 07/28/2016 1118   ALT 13 07/28/2016 1118   ALKPHOS 50 07/28/2016 1118   BILITOT <0.30 07/28/2016 1118   GFRNONAA >60 08/13/2016 0926   GFRAA >60 08/13/2016 0926    INo results found for: SPEP, UPEP  Lab Results  Component Value Date   WBC 5.0 08/13/2016   NEUTROABS 4.2 07/28/2016   HGB 12.9 08/13/2016   HCT 40.8 08/13/2016   MCV 96.9 08/13/2016   PLT 171 08/13/2016      Chemistry      Component Value Date/Time   NA 140 08/13/2016 0926   NA 139 07/28/2016 1118   K 4.3 08/13/2016 0926   K 4.1 07/28/2016 1118   CL 107 08/13/2016 0926   CO2 25 08/13/2016 0926   CO2 26 07/28/2016 1118   BUN 8 08/13/2016 0926   BUN 8.6 07/28/2016 1118   CREATININE 0.94 08/13/2016 0926   CREATININE 0.8 07/28/2016 1118      Component Value Date/Time   CALCIUM 9.2 08/13/2016 0926   CALCIUM 9.7 07/28/2016 1118   ALKPHOS 50 07/28/2016 1118   AST 18 07/28/2016 1118   ALT 13 07/28/2016 1118   BILITOT <0.30 07/28/2016 1118       No results found for: LABCA2  No components found for: LABCA125  No results for input(s): INR in the last 168 hours.  Urinalysis  Component Value Date/Time   COLORURINE YELLOW 04/18/2013 [place] 04/18/2013 1222   LABSPEC 1.011 04/18/2013 1222   PHURINE 5.5 04/18/2013 1222   GLUCOSEU NEGATIVE 04/18/2013 1222   HGBUR TRACE (A) 04/18/2013 1222   BILIRUBINUR negative 03/28/2015 1321   KETONESUR NEGATIVE 04/18/2013 1222   PROTEINUR  negative 03/28/2015 1321   PROTEINUR NEGATIVE 04/18/2013 1222   UROBILINOGEN 0.2 03/28/2015 1321   UROBILINOGEN 0.2 04/18/2013 1222   NITRITE negative 03/28/2015 1321   NITRITE NEGATIVE 04/18/2013 1222   LEUKOCYTESUR Trace 03/28/2015 1321     STUDIES: Ct Chest W Contrast  Result Date: 07/02/2017 CLINICAL DATA:  Metastatic breast cancer. EXAM: CT CHEST WITH CONTRAST TECHNIQUE: Multidetector CT imaging of the chest was performed during intravenous contrast administration. CONTRAST:  8m ISOVUE-300 IOPAMIDOL (ISOVUE-300) INJECTION 61% COMPARISON:  Of 03/18/2017 FINDINGS: Cardiovascular: Normal heart size. No pericardial effusion identified. Mediastinum/Nodes: The trachea appears patent is midline. Normal appearance of the esophagus. No mediastinal or hilar adenopathy. Lungs/Pleura: Mild subpleural atelectasis within the lung bases. No suspicious pulmonary nodules or masses. Upper Abdomen: Several low-attenuation foci are identified within the liver which appears similar previous. Likely benign cysts. No acute abnormality identified within the upper abdomen. Musculoskeletal: Previous left mastectomy with retropectoral implant placement. Scattered sclerotic lesions within the spine are stable. IMPRESSION: 1. Stable CT of the chest. 2. No change in sclerotic lesions involving the thoracic spine. Electronically Signed   By: TKerby MoorsM.D.   On: 07/02/2017 09:21   Nm Bone Scan Whole Body  Result Date: 07/02/2017 CLINICAL DATA:  Breast cancer. EXAM: NUCLEAR MEDICINE WHOLE BODY BONE SCAN TECHNIQUE: Whole body anterior and posterior images were obtained approximately 3 hours after intravenous injection of radiopharmaceutical. RADIOPHARMACEUTICALS:  19.7 MCi Technetium-940mDP IV COMPARISON:  11/10/2016. FINDINGS: Bilateral renal function and excretion noted. Left breast implant. No focal bony abnormality identified. No evidence of metastatic disease . IMPRESSION: Negative exam.  No evidence of metastatic  disease. Electronically Signed   By: ThMarcello MooresRegister   On: 07/02/2017 11:51   Dg Foot Complete Left  Result Date: 06/16/2017 Please see detailed radiograph report in office note.  Dg Foot Complete Right  Result Date: 06/16/2017 Please see detailed radiograph report in office note.    ELIGIBLE FOR AVAILABLE RESEARCH PROTOCOL: no  ASSESSMENT: [age].o.  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  (2) status post left mastectomy and axillary lymph node dissection  with immediate expander placement 07/18/2015 for an mpT4 pN2,stage IIIB invasive ductal carcinoma, grade 3, with negative margins.  (a) definitive implant exchange to be scheduled in December   METASTATIC DISEASE: October 2016  (3) CT scan of the chest abdomen and pelvis  09/16/2015 shows metastatic lesions in the right scapula, left iliac crest, L4, and T spine. There were questionable liver cysts, with repeat CT scan 03/02/2016 showing possible right upper lobe lung lesions and possibly increased liver lesions  (a) CT scan of the chest 06/17/2016 shows no active disease in the lungs or liver  (b) Bone scan July 2017 showed no evidence of bony metastatic disease   (c) head CT 07/08/2016 showed a cerebellar lesion, confirmed by MRI 07/23/2016, status post craniotomy 08/14/2016, confirming a metastatic deposit which was estrogen and progesterone receptor negative, HER-2 amplified with a signals ratio of 7.16, number per cell 13.25  (  d) CA 27-29 not informative  (4) received docetaxel every 3 weeks 6 together with trastuzumab and pertuzumab, last docetaxel dose 02/11/2016  (5) adjuvant radiation7/03/2016 to 06/26/2016 at [facility] [place]: 1. The Left chest wall was treated to 23.4 Gy in 13 fractions at 1.8 Gy per fraction. 2. The Left chest wall was  boosted to 10 Gy in 5 fractions at 2 Gy per fraction. 3. The Left Sclav/PAB was treated to 23.4 Gy in 13 fractions at 1.8 Gy per fraction.  Including the patient's treatment in [place] (received 15 fractions per Dr. Maryan Rued near [place], [place]), the patient received 50.4 Gy to the left chest wall and supraclavicular region.   (6) started trastuzumab and pertuzumab October 2016, continuing every 3 weeks,  (a) echocardiogram 02/26/2016 showed a well preserved ejection fraction  (b) echocardiogram 07/01/2016 shows an ejection fraction in the 60-65%   (c) pertuzumab discontinued 10/2016 with uncontrolled diarrhea  (d) echocardiogram 11/11/2016 showed an ejection fraction in the 60-65%  (e) echocardiogram 03/03/2017 shows an ejection fraction of 60-65%    (7) started denosumab/Xgeva October 2017, repeated every 6 weeks  (8) started anastrozole October 2017   (a) bone scan 11/10/2016 shows no active disease  (b) chest CT scan 11/10/2016 stable, with no evidence of active disease  (c) chest CT and bone scan 07/02/2017 show no evidence of active disease  (9) history of bipolar disorder  (a) currently on Lamictal and Latuda as well as Desiree L and Neurontin  (10) mild anemia with a significant drop in the MCV, ferritin 10 06/03/2016,   (a) Feraheme given 06/12/2016 and 06/18/2016  (11) tobacco abuse: Chantix started 06/18/2016   (12) 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  (a) brain irradiation10/19/17-11/08/17: Whole brain/ 35 Gy in 14 fractions   (b) repeat brain MRI obtained 12/10/2016 shows no active disease in the brain  (c) repeat brain MRI 03/02/2017 shows no evidence of residual or recurrent disease   PLAN: [name] is doing remarkably well both with regards to her metastatic breast cancer and her bipolar disorder. It is wonderful that she has finally got a car and is more mobile. She plans to visit family and to get some more plastic work  done in [place].  I discouraged her use of the tanning bed.  She will benefit from referral to the intimacy and pelvic health physical therapy program and I have put that in for her.  Unfortunately she continues to smoke but she is trying to quit. I am writing her a prescription for nicotine patches if she finds those useful.  We are planning to continue the trastuzumab every 4 weeks indefinitely. Her echocardiograms are scheduled so they cardio oncology group. She also has a brain MRI scheduled for 07/29/2017  We are going to continue to see her with each treatment. I plan to repeat her CT of the chest and bone scan in November  She knows to call for any other problems that may develop before the next visit.  Chauncey Cruel, MD Medical Oncology and Hematology [facility] [address] [place], [place] [address] Tel. [phone]    Fax. [phone]

## 2017-07-06 NOTE — Patient Instructions (Signed)
[facility] Discharge Instructions for Patients Receiving Chemotherapy  Today you received the following chemotherapy agents Herceptin  To help prevent nausea and vomiting after your treatment, we encourage you to take your nausea medication    If you develop nausea and vomiting that is not controlled by your nausea medication, call the clinic.   BELOW ARE SYMPTOMS THAT SHOULD BE REPORTED IMMEDIATELY:  *FEVER GREATER THAN 100.5 F  *CHILLS WITH OR WITHOUT FEVER  NAUSEA AND VOMITING THAT IS NOT CONTROLLED WITH YOUR NAUSEA MEDICATION  *UNUSUAL SHORTNESS OF BREATH  *UNUSUAL BRUISING OR BLEEDING  TENDERNESS IN MOUTH AND THROAT WITH OR WITHOUT PRESENCE OF ULCERS  *URINARY PROBLEMS  *BOWEL PROBLEMS  UNUSUAL RASH Items with * indicate a potential emergency and should be followed up as soon as possible.  Feel free to call the clinic you have any questions or concerns. The clinic phone number is [phone].  Please show the [facility] at check-in to the Emergency Department and triage nurse.  Denosumab injection What is this medicine? DENOSUMAB (den oh sue mab) slows bone breakdown. Prolia is used to treat osteoporosis in women after menopause and in men. Delton See is used to treat a high calcium level due to cancer and to prevent bone fractures and other bone problems caused by multiple myeloma or cancer bone metastases. Delton See is also used to treat giant cell tumor of the bone. This medicine may be used for other purposes; ask your health care provider or pharmacist if you have questions. COMMON BRAND NAME(S): Prolia, XGEVA What should I tell my health care provider before I take this medicine? They need to know if you have any of these conditions: -dental disease -having surgery or tooth extraction -infection -kidney disease -low levels of calcium or Vitamin D in the blood -malnutrition -on hemodialysis -skin conditions or sensitivity -thyroid or  parathyroid disease -an unusual reaction to denosumab, other medicines, foods, dyes, or preservatives -pregnant or trying to get pregnant -breast-feeding How should I use this medicine? This medicine is for injection under the skin. It is given by a health care professional in a hospital or clinic setting. If you are getting Prolia, a special MedGuide will be given to you by the pharmacist with each prescription and refill. Be sure to read this information carefully each time. For Prolia, talk to your pediatrician regarding the use of this medicine in children. Special care may be needed. For Delton See, talk to your pediatrician regarding the use of this medicine in children. While this drug may be prescribed for children as young as 13 years for selected conditions, precautions do apply. Overdosage: If you think you have taken too much of this medicine contact a poison control center or emergency room at once. NOTE: This medicine is only for you. Do not share this medicine with others. What if I miss a dose? It is important not to miss your dose. Call your doctor or health care professional if you are unable to keep an appointment. What may interact with this medicine? Do not take this medicine with any of the following medications: -other medicines containing denosumab This medicine may also interact with the following medications: -medicines that lower your chance of fighting infection -steroid medicines like prednisone or cortisone This list may not describe all possible interactions. Give your health care provider a list of all the medicines, herbs, non-prescription drugs, or dietary supplements you use. Also tell them if you smoke, drink alcohol, or use illegal drugs. Some  items may interact with your medicine. What should I watch for while using this medicine? Visit your doctor or health care professional for regular checks on your progress. Your doctor or health care professional may order  blood tests and other tests to see how you are doing. Call your doctor or health care professional for advice if you get a fever, chills or sore throat, or other symptoms of a cold or flu. Do not treat yourself. This drug may decrease your body's ability to fight infection. Try to avoid being around people who are sick. You should make sure you get enough calcium and vitamin D while you are taking this medicine, unless your doctor tells you not to. Discuss the foods you eat and the vitamins you take with your health care professional. See your dentist regularly. Brush and floss your teeth as directed. Before you have any dental work done, tell your dentist you are receiving this medicine. Do not become pregnant while taking this medicine or for 5 months after stopping it. Talk with your doctor or health care professional about your birth control options while taking this medicine. Women should inform their doctor if they wish to become pregnant or think they might be pregnant. There is a potential for serious side effects to an unborn child. Talk to your health care professional or pharmacist for more information. What side effects may I notice from receiving this medicine? Side effects that you should report to your doctor or health care professional as soon as possible: -allergic reactions like skin rash, itching or hives, swelling of the face, lips, or tongue -bone pain -breathing problems -dizziness -jaw pain, especially after dental work -redness, blistering, peeling of the skin -signs and symptoms of infection like fever or chills; cough; sore throat; pain or trouble passing urine -signs of low calcium like fast heartbeat, muscle cramps or muscle pain; pain, tingling, numbness in the hands or feet; seizures -unusual bleeding or bruising -unusually weak or tired Side effects that usually do not require medical attention (report to your doctor or health care professional if they continue or are  bothersome): -constipation -diarrhea -headache -joint pain -loss of appetite -muscle pain -runny nose -tiredness -upset stomach This list may not describe all possible side effects. Call your doctor for medical advice about side effects. You may report side effects to FDA at 1-800-FDA-1088. Where should I keep my medicine? This medicine is only given in a clinic, doctor's office, or other health care setting and will not be stored at home. NOTE: This sheet is a summary. It may not cover all possible information. If you have questions about this medicine, talk to your doctor, pharmacist, or health care provider.  2018 Elsevier/Gold Standard (2016-12-08 19:17:21)

## 2017-07-07 ENCOUNTER — Other Ambulatory Visit: Payer: Self-pay | Admitting: Oncology

## 2017-07-07 ENCOUNTER — Other Ambulatory Visit: Payer: Self-pay | Admitting: Family Medicine

## 2017-07-07 DIAGNOSIS — G8929 Other chronic pain: Secondary | ICD-10-CM

## 2017-07-07 DIAGNOSIS — R519 Headache, unspecified: Secondary | ICD-10-CM

## 2017-07-07 DIAGNOSIS — R51 Headache: Principal | ICD-10-CM

## 2017-07-07 LAB — CANCER ANTIGEN 27.29: CA 27.29: 10.2 U/mL (ref 0.0–38.6)

## 2017-07-15 NOTE — Progress Notes (Addendum)
Stage IV M y.o. 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  Headache: Everyday  Dizziness: none Nausea/Vomiting/Ataxia: Nausea Visional changes(Blurred/double vision, blind spots and peripheral vision changes):none Weight:none Fatigue: all the time 07-29-17 Scheduled for MRI brain with sedation Vitals:   07/26/17 0853  BP: (!) 110/58  Pulse: 79  Resp: 20  Temp: 98.8 F (37.1 C)  TempSrc: Oral  SpO2: 100%  Weight: 124 lb 8 oz (56.5 kg)   Wt Readings from Last 3 Encounters:  07/26/17 124 lb 8 oz (56.5 kg)  07/06/17 125 lb (56.7 kg)  05/21/17 121 lb 3.2 oz (55 kg)

## 2017-07-22 NOTE — Addendum Note (Signed)
Addendum  created 07/22/17 1501 by Roberts Gaudy, MD   Sign clinical note

## 2017-07-22 NOTE — Addendum Note (Signed)
Addendum  created 07/22/17 1239 by Roberts Gaudy, MD   Sign clinical note

## 2017-07-23 ENCOUNTER — Other Ambulatory Visit: Payer: Self-pay | Admitting: *Deleted

## 2017-07-23 ENCOUNTER — Other Ambulatory Visit: Payer: Self-pay | Admitting: Oncology

## 2017-07-23 MED ORDER — ONDANSETRON HCL 8 MG PO TABS: 8.0000 mg | ORAL_TABLET | Freq: Three times a day (TID) | ORAL | 0 refills | Status: DC | PRN

## 2017-07-26 ENCOUNTER — Encounter: Payer: Self-pay | Admitting: Radiation Oncology

## 2017-07-26 ENCOUNTER — Ambulatory Visit
Admission: RE | Admit: 2017-07-26 | Discharge: 2017-07-26 | Disposition: A | Discharge status: Home / Self Care | Payer: Medicaid Other | Source: Ambulatory Visit | Attending: Radiation Oncology | Admitting: Radiation Oncology

## 2017-07-26 VITALS — BP 110/58 | HR 79 | Temp 98.8°F | Resp 20 | Wt 124.5 lb

## 2017-07-26 DIAGNOSIS — K219 Gastro-esophageal reflux disease without esophagitis: Secondary | ICD-10-CM | POA: Diagnosis not present

## 2017-07-26 DIAGNOSIS — F319 Bipolar disorder, unspecified: Secondary | ICD-10-CM | POA: Insufficient documentation

## 2017-07-26 DIAGNOSIS — F17210 Nicotine dependence, cigarettes, uncomplicated: Secondary | ICD-10-CM | POA: Diagnosis not present

## 2017-07-26 DIAGNOSIS — Z923 Personal history of irradiation: Secondary | ICD-10-CM | POA: Insufficient documentation

## 2017-07-26 DIAGNOSIS — C7931 Secondary malignant neoplasm of brain: Secondary | ICD-10-CM | POA: Insufficient documentation

## 2017-07-26 DIAGNOSIS — C50919 Malignant neoplasm of unspecified site of unspecified female breast: Secondary | ICD-10-CM

## 2017-07-26 DIAGNOSIS — Z8249 Family history of ischemic heart disease and other diseases of the circulatory system: Secondary | ICD-10-CM | POA: Insufficient documentation

## 2017-07-26 DIAGNOSIS — R110 Nausea: Secondary | ICD-10-CM | POA: Diagnosis not present

## 2017-07-26 DIAGNOSIS — C7951 Secondary malignant neoplasm of bone: Secondary | ICD-10-CM | POA: Diagnosis not present

## 2017-07-26 DIAGNOSIS — Z9012 Acquired absence of left breast and nipple: Secondary | ICD-10-CM | POA: Insufficient documentation

## 2017-07-26 DIAGNOSIS — C50312 Malignant neoplasm of lower-inner quadrant of left female breast: Secondary | ICD-10-CM | POA: Insufficient documentation

## 2017-07-26 DIAGNOSIS — J9811 Atelectasis: Secondary | ICD-10-CM | POA: Diagnosis not present

## 2017-07-26 DIAGNOSIS — Z9882 Breast implant status: Secondary | ICD-10-CM | POA: Diagnosis not present

## 2017-07-26 DIAGNOSIS — R918 Other nonspecific abnormal finding of lung field: Secondary | ICD-10-CM | POA: Diagnosis not present

## 2017-07-26 DIAGNOSIS — Z7401 Bed confinement status: Secondary | ICD-10-CM | POA: Diagnosis not present

## 2017-07-26 DIAGNOSIS — F4310 Post-traumatic stress disorder, unspecified: Secondary | ICD-10-CM | POA: Insufficient documentation

## 2017-07-26 DIAGNOSIS — R51 Headache: Secondary | ICD-10-CM | POA: Insufficient documentation

## 2017-07-26 DIAGNOSIS — Z9221 Personal history of antineoplastic chemotherapy: Secondary | ICD-10-CM | POA: Insufficient documentation

## 2017-07-26 DIAGNOSIS — M797 Fibromyalgia: Secondary | ICD-10-CM | POA: Diagnosis not present

## 2017-07-26 DIAGNOSIS — G8929 Other chronic pain: Secondary | ICD-10-CM | POA: Insufficient documentation

## 2017-07-26 MED ORDER — IBUPROFEN 800 MG PO TABS: 800.0000 mg | ORAL_TABLET | Freq: Three times a day (TID) | ORAL | 0 refills | Status: DC

## 2017-07-26 MED ORDER — ONDANSETRON HCL 8 MG PO TABS: 8.0000 mg | ORAL_TABLET | Freq: Three times a day (TID) | ORAL | 1 refills | Status: DC | PRN

## 2017-07-26 NOTE — Progress Notes (Signed)
Radiation Oncology         [phone] ________________________________  Name: [name] MRN: [id number]  Date: 07/26/2017  DOB: 1961/06/12  KG:URKY, Shannon Ferretti, MD  Shannon Nearing, MD     REFERRING PHYSICIAN: [name], MD   DIAGNOSIS: The primary encounter diagnosis was Brain metastasis ([place]). Diagnoses of Metastatic breast cancer ([place]) and Primary cancer of lower-inner quadrant of left female breast [facility]) were also pertinent to this visit.   HISTORY OF PRESENT ILLNESS: [name] is a 56 y.o. female with a history of stage IV breast cancer. She noticed a lump the size of a lemon in her left axillary region for a year prior to her mammogram. She requested a mammogram after her breast started swelling. Diagnostic imaging followed by biopsy in August 2016 revealed an invasive lobular carcinoma, triple positive. She subsequently underwent mastectomy with axillary lymph node dissection in tissue expander placement on 08/21/2015. The tumor was consistent with invasive indeterminate grade mammary carcinoma with mixed ductal and lobular features, and high-grade ductal carcinoma in situ, the tumor size was 1.3 cm, sentinel axillary lymph nodes demonstrated 4 out of 15 being positive for metastatic carcinoma. She was found to have a sclerotic lesion in the posterior left iliac bone, there is questionable lesions in the liver, as well as pulmonary nodules. She began systemic chemotherapy in November 2016 with Taxotere, Herceptin projection. She completed her Taxotere and March 2017 and continues on Herceptin and Perjeta with monthly Xgeva. She did have a posttreatment CT scan to restage on 03/02/2016, this revealed multiple new ill-defined right upper lobe pulmonary parenchymal nodules, lobular dependent pleural fluid and potential mild pleural thickening bilaterally, numerous low-attenuation foci again are present within the liver slightly increased that have not significantly changed in size.  The sclerotic lesion in the posterior left iliac bone was stable. She began radiotherapy on 05/08/2016 and was to receive 33 fractions. She has moved to [place], received the remainder of her radiotherapy. 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 She's been followed however this has become further off schedule due to non compliance. She comes today for evaluation prior to undergoing MRI with general anesthesia.   PREVIOUS RADIATION THERAPY:   09/17/16-10/07/16: Whole brain treated to 35 Gy in 14 fractions  06/03/16-06/26/16: Completion of treatment to the left chest wall and nodes in [facility] with Dr. Lisbeth Hunter: total of 45Gy  Summer 2017: received radiotherapy to the left chest wall and regional lymph nodes, received 15 fractions per Dr. Maryan Rued near [place], [place].   PAST MEDICAL HISTORY:  Past Medical History:  Diagnosis Date  . Alcohol abuse   . Anemia    during chemo  . Anxiety    At age 61  . Arthritis Dx 2010  . Bipolar disorder ([place])   . Cancer ([place])    breast mets to brain  . Chronic pain   . Complication of anesthesia   . Depression   . Fibromyalgia Dx 2005  . GERD (gastroesophageal reflux disease)   . Headache    hx  migraines  . Opiate dependence ([place])   . PONV (postoperative nausea and vomiting)   . Port-a-cath in place   . PTSD (post-traumatic stress disorder)        PAST SURGICAL HISTORY: Past Surgical History:  Procedure Laterality Date  . APPLICATION OF CRANIAL NAVIGATION N/A 08/14/2016   Procedure: APPLICATION OF CRANIAL NAVIGATION;  Surgeon: Erline Levine, MD;  Location: [facility];  Service: Neurosurgery;  Laterality: N/A;  . BREAST RECONSTRUCTION Left    with silicone implant  . CRANIOTOMY N/A  08/14/2016   Procedure: CRANIOTOMY TUMOR EXCISION WITH [name];  Surgeon: Erline Levine, MD;  Location: [facility];  Service: Neurosurgery;  Laterality: N/A;  . FIBULA FRACTURE SURGERY Left   . MASTECTOMY Left   . RADIOLOGY WITH ANESTHESIA N/A 07/23/2016   Procedure: MRI OF BRAIN WITH AND WITHOUT;  Surgeon: Medication Radiologist, MD;  Location: [facility];  Service: Radiology;  Laterality: N/A;  . RADIOLOGY WITH ANESTHESIA N/A 09/08/2016   Procedure: MRI OF BRAIN WITH AND WITHOUT CONTRAST;  Surgeon: Medication Radiologist, MD;  Location: [facility];  Service: Radiology;  Laterality: N/A;  . RADIOLOGY WITH ANESTHESIA N/A 12/10/2016   Procedure: MRI OF BRAIN WITH AND WITHOUT;  Surgeon: Medication Radiologist, MD;  Location: [facility];  Service: Radiology;  Laterality: N/A;  . RADIOLOGY WITH ANESTHESIA N/A 03/02/2017   Procedure: MRI of BRAIN W and W/OUT CONTRAST;  Surgeon: Medication Radiologist, MD;  Location: [facility];  Service: Radiology;  Laterality: N/A;  . right power port placement Right      FAMILY HISTORY:  Family History  Problem Relation Age of Onset  . Diabetes Mother   . Bipolar disorder Mother   . CAD Father      SOCIAL HISTORY:  reports that she has been smoking Cigarettes.  She has been smoking about 0.25 packs per day. She has never used smokeless tobacco. She reports that she does not drink alcohol or use drugs.She is divorced and lives in [place]. She is a recovering alcoholic and had been clean also from using prescription pain pills for about 4 years time when we met her in 2017, however has had episodes of non compliance with her medical care with suspicion for intermittent drug use.  ALLERGIES: Demerol and Erythromycin   MEDICATIONS:  Current Outpatient Prescriptions  Medication Sig Dispense Refill  . anastrozole (ARIMIDEX) 1 MG tablet Take 1 tablet (1 mg total) by mouth daily. 90 tablet 1  . Biotin 1 MG CAPS Take 1 mg by mouth daily.    . butalbital-acetaminophen-caffeine  (FIORICET WITH CODEINE) 50-325-40-30 MG capsule TAKE 1 CAPSULE BY MOUTH EVERY 12 HOURS AS NEEDED FOR HEADACHE 40 capsule 1  . cholecalciferol (VITAMIN D) 1000 units tablet Take 1,000 Units by mouth daily.    [name] dextromethorphan-guaiFENesin (MUCINEX DM) 30-600 MG 12hr tablet Take 1 tablet by mouth 2 (two) times daily. 30 tablet 1  . docusate sodium (COLACE) 50 MG capsule Take 50 mg by mouth 2 (two) times daily.    . fluticasone (FLONASE) 50 MCG/ACT nasal spray Place 2 sprays into both nostrils daily. 16 g 0  . gabapentin (NEURONTIN) 300 MG capsule Take 1 capsule (300 mg total) by mouth 2 (two) times daily. 60 capsule 3  . ibuprofen (ADVIL,MOTRIN) 800 MG tablet Take 1 tablet (800 mg total) by mouth 3 (three) times daily. 90 tablet 0  . lamoTRIgine (LAMICTAL) 25 MG tablet Take 1 tablet (25 mg total) by mouth 2 (two) times daily. 60 tablet 0  . loratadine (CLARITIN) 10 MG tablet Take 1 tablet (10 mg total) by mouth daily. 30 tablet 11  . lurasidone (LATUDA) 40 MG TABS tablet Take 1 tablet (40 mg total) by mouth daily with breakfast. 30 tablet 0  .  pantoprazole (PROTONIX) 40 MG tablet Take 1 tablet (40 mg total) by mouth daily. 30 tablet 3  . polyethylene glycol (MIRALAX / GLYCOLAX) packet Take 17 g by mouth daily as needed for mild constipation. 30 each 1  . traZODone (DESYREL) 100 MG tablet Take 1 tablet (100 mg total) by mouth at bedtime. 30 tablet 0  . ondansetron (ZOFRAN) 8 MG tablet Take 1 tablet (8 mg total) by mouth every 8 (eight) hours as needed for nausea or vomiting. 90 tablet 1   Current Facility-Administered Medications  Medication Dose Route Frequency Provider Last Rate Last Dose  . betamethasone acetate-betamethasone sodium phosphate (CELESTONE) injection 3 mg  3 mg Intramuscular Once Edrick Kins, DPM        REVIEW OF SYSTEMS: On review of systems, the patient reports that she is doing well overall. She requests refills on Ibuprofen she takes for intermittent headaches. She describes  progressive headaches that on stress or tiredness she states that she had migraine headache yesterday, she has also been taking. That narcotic pain medicine. She states that this helps very minimally. She denies difficulty with diet, but states that she doesn't have nausea,and requested a refill of her Zofran as well. She denies any chest pain, shortness of breath, cough, fevers, chills, night sweats, unintended weight changes. She denies any bowel or bladder disturbances, and denies abdominal pain, nausea or vomiting. She denies any new musculoskeletal or joint aches or pains, new skin lesions or concerns. A complete review of systems is obtained and is otherwise negative.   PHYSICAL EXAM:  Wt Readings from Last 3 Encounters:  07/26/17 124 lb 8 oz (56.5 kg)  07/06/17 125 lb (56.7 kg)  05/21/17 121 lb 3.2 oz (55 kg)   Temp Readings from Last 3 Encounters:  07/26/17 98.8 F (37.1 C) (Oral)  07/06/17 97.8 F (36.6 C) (Oral)  06/08/17 98.2 F (36.8 C) (Oral)   BP Readings from Last 3 Encounters:  07/26/17 (!) 110/58  07/06/17 101/63  06/08/17 (!) 101/54   Pulse Readings from Last 3 Encounters:  07/26/17 79  07/06/17 62  06/08/17 60   Pain Assessment Pain Score: 4 /10 In general this is a well appearing caucasian female in no acute distress. She is alert and oriented x4 and appropriate throughout the examination. HEENT reveals that the patient is normocephalic, atraumatic. EOMs are intact. PERRLA. Skin is intact without any evidence of gross lesions. Cardiovascular exam reveals a regular rate and rhythm, no clicks rubs or murmurs are auscultated. Chest is clear to auscultation bilaterally. Lymphatic assessment is performed and does not reveal any adenopathy in the cervical, supraclavicular, axillary, or inguinal chains. Abdomen has active bowel sounds in all quadrants and is intact. The abdomen is soft, non tender, non distended. Lower extremities are negative for pretibial pitting edema,  deep calf tenderness, cyanosis or clubbing.    ECOG = 1 0 - Asymptomatic (Fully active, able to carry on all predisease activities without restriction)  1 - Symptomatic but completely ambulatory (Restricted in physically strenuous activity but ambulatory and able to carry out work of a light or sedentary nature. For example, light housework, office work)  2 - Symptomatic, <50% in bed during the day (Ambulatory and capable of all self care but unable to carry out any work activities. Up and about more than 50% of waking hours)  3 - Symptomatic, >50% in bed, but not bedbound (Capable of only limited self-care, confined to bed or chair 50% or more of waking hours)  4 - Bedbound (Completely disabled. Cannot carry on any self-care. Totally confined to bed or chair)  5 - Death   Eustace Pen MM, Creech RH, Tormey DC, et al. [phone]). "Toxicity and response criteria of the [facility]". [place] Oncol. 5 (6): 649-55    LABORATORY DATA:  Lab Results  Component Value Date   WBC 3.7 (L) 07/06/2017   HGB 12.8 07/06/2017   HCT 38.7 07/06/2017   MCV 96.9 07/06/2017   PLT 154 07/06/2017   Lab Results  Component Value Date   NA 138 07/06/2017   K 4.2 07/06/2017   CL 114 (H) 04/27/2017   CO2 27 07/06/2017   Lab Results  Component Value Date   ALT 15 07/06/2017   AST 17 07/06/2017   ALKPHOS 52 07/06/2017   BILITOT 0.28 07/06/2017      RADIOGRAPHY: Ct Chest W Contrast  Result Date: 07/02/2017 CLINICAL DATA:  Metastatic breast cancer. EXAM: CT CHEST WITH CONTRAST TECHNIQUE: Multidetector CT imaging of the chest was performed during intravenous contrast administration. CONTRAST:  57m ISOVUE-300 IOPAMIDOL (ISOVUE-300) INJECTION 61% COMPARISON:  Of 03/18/2017 FINDINGS: Cardiovascular: Normal heart size. No pericardial effusion identified. Mediastinum/Nodes: The trachea appears patent is midline. Normal appearance of the esophagus. No mediastinal or hilar adenopathy.  Lungs/Pleura: Mild subpleural atelectasis within the lung bases. No suspicious pulmonary nodules or masses. Upper Abdomen: Several low-attenuation foci are identified within the liver which appears similar previous. Likely benign cysts. No acute abnormality identified within the upper abdomen. Musculoskeletal: Previous left mastectomy with retropectoral implant placement. Scattered sclerotic lesions within the spine are stable. IMPRESSION: 1. Stable CT of the chest. 2. No change in sclerotic lesions involving the thoracic spine. Electronically Signed   By: TKerby MoorsM.D.   On: 07/02/2017 09:21   Nm Bone Scan Whole Body  Result Date: 07/02/2017 CLINICAL DATA:  Breast cancer. EXAM: NUCLEAR MEDICINE WHOLE BODY BONE SCAN TECHNIQUE: Whole body anterior and posterior images were obtained approximately 3 hours after intravenous injection of radiopharmaceutical. RADIOPHARMACEUTICALS:  19.7 MCi Technetium-948mDP IV COMPARISON:  11/10/2016. FINDINGS: Bilateral renal function and excretion noted. Left breast implant. No focal bony abnormality identified. No evidence of metastatic disease . IMPRESSION: Negative exam.  No evidence of metastatic disease. Electronically Signed   By: ThMarcello MooresRegister   On: 07/02/2017 11:51       IMPRESSION/PLAN:  1. Stage IV, T4, N3, M1 invasive lobular and ductal carcinoma, ER positive, HER-2 positive the left breast with metastatic disease to bone and brain. Patient appears to be doing pretty well overall. She is due for her surveillance MRI.we will coordinate this to occur on 07/29/2017. She states agreement and understanding. 2. Headaches. The patient is taking medication for headaches without improvement. I discussed with her referral to Dr. VaMickeal Skinnershe is in agreement with this.results provided for her ibuprofen. 3. Nausea. The patient is given a refill for her Zofran. She will discuss this further with Dr. MaJana Hakimhat she continues on systemic therapy.  In a visit lasting  25 minutes, greater than 50% of the time was spent face to face discussing her symptoms, and coordinating the patient's care.    AlCarola RhinePAC

## 2017-07-28 ENCOUNTER — Encounter (HOSPITAL_COMMUNITY): Payer: Self-pay | Admitting: *Deleted

## 2017-07-28 NOTE — Progress Notes (Signed)
Spoke with pt for pre-op call. Pt denies cardiac history or diabetes. Pt states she is smoking and was instructed to stop as of now prior to surgery. She voiced understanding.

## 2017-07-29 ENCOUNTER — Encounter (HOSPITAL_COMMUNITY): Admission: RE | Disposition: A | Discharge status: Home / Self Care | Payer: Self-pay | Source: Ambulatory Visit

## 2017-07-29 ENCOUNTER — Ambulatory Visit (HOSPITAL_COMMUNITY)
Admission: RE | Admit: 2017-07-29 | Discharge: 2017-07-29 | Disposition: A | Discharge status: Home / Self Care | Payer: Medicaid Other | Source: Ambulatory Visit | Attending: Radiation Oncology | Admitting: Radiation Oncology

## 2017-07-29 ENCOUNTER — Encounter (HOSPITAL_COMMUNITY): Payer: Self-pay | Admitting: Certified Registered Nurse Anesthetist

## 2017-07-29 ENCOUNTER — Ambulatory Visit (HOSPITAL_COMMUNITY): Payer: Medicaid Other | Admitting: Certified Registered Nurse Anesthetist

## 2017-07-29 DIAGNOSIS — M797 Fibromyalgia: Secondary | ICD-10-CM | POA: Diagnosis not present

## 2017-07-29 DIAGNOSIS — C50919 Malignant neoplasm of unspecified site of unspecified female breast: Secondary | ICD-10-CM | POA: Insufficient documentation

## 2017-07-29 DIAGNOSIS — F17210 Nicotine dependence, cigarettes, uncomplicated: Secondary | ICD-10-CM | POA: Diagnosis not present

## 2017-07-29 HISTORY — PX: RADIOLOGY WITH ANESTHESIA: SHX6223

## 2017-07-29 LAB — BASIC METABOLIC PANEL
Anion gap: 10 (ref 5–15)
BUN: 10 mg/dL (ref 6–20)
CO2: 23 mmol/L (ref 22–32)
Calcium: 9.2 mg/dL (ref 8.9–10.3)
Chloride: 109 mmol/L (ref 101–111)
Creatinine, Ser: 0.92 mg/dL (ref 0.44–1.00)
GFR calc Af Amer: >60 mL/min (ref >60)
GFR calc non Af Amer: >60 mL/min (ref >60)
Glucose, Bld: 88 mg/dL (ref 65–99)
Potassium: 4.4 mmol/L (ref 3.5–5.1)
Sodium: 142 mmol/L (ref 135–145)

## 2017-07-29 LAB — CBC
HCT: 39.2 % (ref 36.0–46.0)
Hemoglobin: 13.2 g/dL (ref 12.0–15.0)
MCH: 32.5 pg (ref 26.0–34.0)
MCHC: 33.7 g/dL (ref 30.0–36.0)
MCV: 96.6 fL (ref 78.0–100.0)
Platelets: 173 10*3/uL (ref 150–400)
RBC: 4.06 MIL/uL (ref 3.87–5.11)
RDW: 13.3 % (ref 11.5–15.5)
WBC: 4.9 10*3/uL (ref 4.0–10.5)

## 2017-07-29 SURGERY — RADIOLOGY WITH ANESTHESIA
Anesthesia: General

## 2017-07-29 MED ORDER — PROPOFOL 10 MG/ML IV BOLUS
INTRAVENOUS | Status: DC | PRN
  Administered 2017-07-29: 150 mg via INTRAVENOUS

## 2017-07-29 MED ORDER — ONDANSETRON HCL 4 MG/2ML IJ SOLN
INTRAMUSCULAR | Status: DC | PRN
  Administered 2017-07-29: 4 mg via INTRAVENOUS

## 2017-07-29 MED ORDER — EPHEDRINE SULFATE 50 MG/ML IJ SOLN
INTRAMUSCULAR | Status: DC | PRN
  Administered 2017-07-29 (×3): 10 mg via INTRAVENOUS

## 2017-07-29 MED ORDER — GADOBENATE DIMEGLUMINE 529 MG/ML IV SOLN
10.0000 mL | Freq: Once | INTRAVENOUS | Status: AC
  Administered 2017-07-29: 10 mL via INTRAVENOUS

## 2017-07-29 MED ORDER — MIDAZOLAM HCL 2 MG/2ML IJ SOLN
INTRAMUSCULAR | Status: DC | PRN
  Administered 2017-07-29 (×2): 2 mg via INTRAVENOUS

## 2017-07-29 MED ORDER — LIDOCAINE 2% (20 MG/ML) 5 ML SYRINGE
INTRAMUSCULAR | Status: DC | PRN
  Administered 2017-07-29: 100 mg via INTRAVENOUS

## 2017-07-29 MED ORDER — LACTATED RINGERS IV SOLN
INTRAVENOUS | Status: DC
  Administered 2017-07-29: 50 mL/h via INTRAVENOUS

## 2017-07-29 NOTE — Anesthesia Procedure Notes (Signed)
Procedure Name: LMA Insertion Date/Time: 07/29/2017 8:50 AM Performed by: Candis Shine Pre-anesthesia Checklist: Patient identified, Emergency Drugs available, Suction available and Patient being monitored Patient Re-evaluated:Patient Re-evaluated prior to induction Oxygen Delivery Method: Circle System Utilized Preoxygenation: Pre-oxygenation with 100% oxygen Induction Type: IV induction Ventilation: Mask ventilation without difficulty LMA: LMA inserted LMA Size: 4.0 Number of attempts: 1 Placement Confirmation: positive ETCO2 Tube secured with: Tape Dental Injury: Teeth and Oropharynx as per pre-operative assessment

## 2017-07-29 NOTE — Progress Notes (Signed)
MRI paperwork faxed to MRI.

## 2017-07-29 NOTE — Anesthesia Preprocedure Evaluation (Signed)
Anesthesia Evaluation  Patient identified by MRN, date of birth, ID band Patient awake    Reviewed: Allergy & Precautions, NPO status , Patient's Chart, lab work & pertinent test results  History of Anesthesia Complications (+) PONV and history of anesthetic complications  Airway Mallampati: II  TM Distance: >3 FB Neck ROM: Full    Dental  (+) Teeth Intact, Dental Advisory Given   Pulmonary Current Smoker,    breath sounds clear to auscultation       Cardiovascular  Rhythm:Regular Rate:Normal     Neuro/Psych  Headaches, PSYCHIATRIC DISORDERS Anxiety Depression Bipolar Disorder Breast Ca with metastses s/p surgery and now on immunotherapy.    GI/Hepatic GERD  ,  Endo/Other    Renal/GU      Musculoskeletal  (+) Arthritis , Fibromyalgia -  Abdominal   Peds  Hematology  (+) anemia ,   Anesthesia Other Findings ECHO 6/18  - Left ventricle: The cavity size was normal. Wall thickness was   normal. Systolic function was normal. The estimated ejection   fraction was in the range of 55% to 60%. Mildly reduced GLPSS at   -16%. Left ventricular diastolic function parameters were normal. - Left atrium: The atrium was normal in size. - Inferior vena cava: The vessel was normal in size. The   respirophasic diameter changes were in the normal range (>= 50%),   consistent with normal central venous pressure.   Reproductive/Obstetrics                             Anesthesia Physical  Anesthesia Plan  ASA: III  Anesthesia Plan: General   Post-op Pain Management:    Induction: Intravenous  PONV Risk Score and Plan:   Airway Management Planned: LMA and Oral ETT  Additional Equipment:   Intra-op Plan:   Post-operative Plan: Extubation in OR  Informed Consent:   Plan Discussed with: CRNA  Anesthesia Plan Comments: (Patient had MRI in 11/2016 under [place]   )        Anesthesia  Quick Evaluation

## 2017-07-29 NOTE — Transfer of Care (Signed)
Immediate Anesthesia Transfer of Care Note  Patient: [name]  Procedure(s) Performed: Procedure(s): RADIOLOGY WITH ANESTHESIA MRI OF BRAIN WITH AND WITHOUT CONTRAST (N/A)  Patient Location: PACU  Anesthesia Type:General  Level of Consciousness: awake, alert  and oriented  Airway & Oxygen Therapy: Patient Spontanous Breathing and Patient connected to nasal cannula oxygen  Post-op Assessment: Report given to RN and Post -op Vital signs reviewed and stable  Post vital signs: Reviewed and stable  Last Vitals:  Vitals:   07/29/17 0649 07/29/17 1017  BP: (!) 141/115 (!) 116/59  Pulse: 76 77  Resp: 18 20  Temp: 36.6 C (!) 36.3 C  SpO2: 99% 100%    Last Pain:  Vitals:   07/29/17 0649  TempSrc: Oral      Patients Stated Pain Goal: 3 (08/22/29 0762)  Complications: No apparent anesthesia complications

## 2017-07-30 ENCOUNTER — Encounter (HOSPITAL_COMMUNITY): Payer: Self-pay | Admitting: Radiology

## 2017-07-30 MED FILL — Lidocaine HCl Local Soln Prefilled Syringe 100 MG/5ML (2%): INTRAMUSCULAR | Qty: 5 | Status: AC

## 2017-07-30 MED FILL — Ondansetron HCl Inj 4 MG/2ML (2 MG/ML): INTRAMUSCULAR | Qty: 2 | Status: AC

## 2017-07-30 MED FILL — Midazolam HCl Inj 2 MG/2ML (Base Equivalent): INTRAMUSCULAR | Qty: 4 | Status: AC

## 2017-07-30 MED FILL — Propofol IV Emul 200 MG/20ML (10 MG/ML): INTRAVENOUS | Qty: 20 | Status: AC

## 2017-07-30 MED FILL — Ephedrine Sulf-NaCl Soln Pref Syr 50 MG/10ML-0.9% (5 MG/ML): INTRAVENOUS | Qty: 10 | Status: AC

## 2017-07-30 NOTE — Anesthesia Postprocedure Evaluation (Signed)
Anesthesia Post Note  Patient: [name]  Procedure(s) Performed: Procedure(s) (LRB): RADIOLOGY WITH ANESTHESIA MRI OF BRAIN WITH AND WITHOUT CONTRAST (N/A)     Patient location during evaluation: PACU Anesthesia Type: General Level of consciousness: awake and alert Pain management: pain level controlled Vital Signs Assessment: post-procedure vital signs reviewed and stable Respiratory status: spontaneous breathing, nonlabored ventilation, respiratory function stable and patient connected to nasal cannula oxygen Cardiovascular status: blood pressure returned to baseline and stable Postop Assessment: no signs of nausea or vomiting Anesthetic complications: no    Last Vitals:  Vitals:   07/29/17 0649 07/29/17 1017  BP: (!) 141/115 (!) 116/59  Pulse: 76 77  Resp: 18 20  Temp: 36.6 C (!) 36.3 C  SpO2: 99% 100%    Last Pain:  Vitals:   07/29/17 0649  TempSrc: Oral   Pain Goal: Patients Stated Pain Goal: 3 (07/29/17 0705)               Quinto Tippy

## 2017-08-03 ENCOUNTER — Other Ambulatory Visit (HOSPITAL_BASED_OUTPATIENT_CLINIC_OR_DEPARTMENT_OTHER): Payer: Medicaid Other

## 2017-08-03 ENCOUNTER — Encounter: Payer: Self-pay | Admitting: Adult Health

## 2017-08-03 ENCOUNTER — Other Ambulatory Visit: Payer: Self-pay

## 2017-08-03 ENCOUNTER — Ambulatory Visit (HOSPITAL_BASED_OUTPATIENT_CLINIC_OR_DEPARTMENT_OTHER): Payer: Medicaid Other

## 2017-08-03 ENCOUNTER — Ambulatory Visit (HOSPITAL_BASED_OUTPATIENT_CLINIC_OR_DEPARTMENT_OTHER): Payer: Medicaid Other | Admitting: Adult Health

## 2017-08-03 VITALS — BP 129/48 | HR 66 | Temp 97.9°F | Resp 18 | Ht 65.0 in | Wt 124.3 lb

## 2017-08-03 DIAGNOSIS — C50312 Malignant neoplasm of lower-inner quadrant of left female breast: Secondary | ICD-10-CM

## 2017-08-03 DIAGNOSIS — Z5112 Encounter for antineoplastic immunotherapy: Secondary | ICD-10-CM | POA: Diagnosis present

## 2017-08-03 DIAGNOSIS — Z79811 Long term (current) use of aromatase inhibitors: Secondary | ICD-10-CM | POA: Diagnosis not present

## 2017-08-03 DIAGNOSIS — Z720 Tobacco use: Secondary | ICD-10-CM | POA: Diagnosis not present

## 2017-08-03 DIAGNOSIS — C7951 Secondary malignant neoplasm of bone: Secondary | ICD-10-CM | POA: Diagnosis not present

## 2017-08-03 DIAGNOSIS — C773 Secondary and unspecified malignant neoplasm of axilla and upper limb lymph nodes: Secondary | ICD-10-CM | POA: Diagnosis not present

## 2017-08-03 DIAGNOSIS — Z1239 Encounter for other screening for malignant neoplasm of breast: Secondary | ICD-10-CM

## 2017-08-03 LAB — CBC WITH DIFFERENTIAL/PLATELET
BASO%: 0.6 % (ref 0.0–2.0)
Basophils Absolute: 0 10*3/uL (ref 0.0–0.1)
EOS%: 0.6 % (ref 0.0–7.0)
Eosinophils Absolute: 0 10*3/uL (ref 0.0–0.5)
HCT: 39.7 % (ref 34.8–46.6)
HGB: 13.6 g/dL (ref 11.6–15.9)
LYMPH%: 20.1 % (ref 14.0–49.7)
MCH: 33.2 pg (ref 25.1–34.0)
MCHC: 34.2 g/dL (ref 31.5–36.0)
MCV: 97.1 fL (ref 79.5–101.0)
MONO#: 0.3 10*3/uL (ref 0.1–0.9)
MONO%: 5.9 % (ref 0.0–14.0)
NEUT#: 3.4 10*3/uL (ref 1.5–6.5)
NEUT%: 72.8 % (ref 38.4–76.8)
Platelets: 170 10*3/uL (ref 145–400)
RBC: 4.09 10*6/uL (ref 3.70–5.45)
RDW: 13.8 % (ref 11.2–14.5)
WBC: 4.7 10*3/uL (ref 3.9–10.3)
lymph#: 0.9 10*3/uL (ref 0.9–3.3)

## 2017-08-03 LAB — COMPREHENSIVE METABOLIC PANEL
ALT: 15 U/L (ref 0–55)
AST: 17 U/L (ref 5–34)
Albumin: 4 g/dL (ref 3.5–5.0)
Alkaline Phosphatase: 49 U/L (ref 40–150)
Anion Gap: 9 mEq/L (ref 3–11)
BUN: 9 mg/dL (ref 7.0–26.0)
CO2: 25 mEq/L (ref 22–29)
Calcium: 9.6 mg/dL (ref 8.4–10.4)
Chloride: 105 mEq/L (ref 98–109)
Creatinine: 1 mg/dL (ref 0.6–1.1)
EGFR: 64 mL/min/{1.73_m2} — ABNORMAL LOW (ref >90)
Glucose: 107 mg/dl (ref 70–140)
Potassium: 3.5 mEq/L (ref 3.5–5.1)
Sodium: 140 mEq/L (ref 136–145)
Total Bilirubin: <0.22 mg/dL (ref 0.20–1.20)
Total Protein: 6.6 g/dL (ref 6.4–8.3)

## 2017-08-03 MED ORDER — SODIUM CHLORIDE 0.9 % IV SOLN
Freq: Once | INTRAVENOUS | Status: AC
  Administered 2017-08-03: 11:00:00 via INTRAVENOUS

## 2017-08-03 MED ORDER — TRASTUZUMAB CHEMO 150 MG IV SOLR
450.0000 mg | Freq: Once | INTRAVENOUS | Status: AC
  Administered 2017-08-03: 450 mg via INTRAVENOUS
  Filled 2017-08-03: qty 21.43

## 2017-08-03 MED ORDER — HEPARIN SOD (PORK) LOCK FLUSH 100 UNIT/ML IV SOLN
500.0000 [IU] | Freq: Once | INTRAVENOUS | Status: AC | PRN
  Administered 2017-08-03: 500 [IU]
  Filled 2017-08-03: qty 5

## 2017-08-03 MED ORDER — DIPHENHYDRAMINE HCL 25 MG PO CAPS: 25.0000 mg | ORAL_CAPSULE | Freq: Once | ORAL | Status: DC

## 2017-08-03 MED ORDER — DENOSUMAB 120 MG/1.7ML ~~LOC~~ SOLN
120.0000 mg | Freq: Once | SUBCUTANEOUS | Status: AC
  Administered 2017-08-03: 120 mg via SUBCUTANEOUS
  Filled 2017-08-03: qty 1.7

## 2017-08-03 MED ORDER — ACETAMINOPHEN 325 MG PO TABS: 650.0000 mg | ORAL_TABLET | Freq: Once | ORAL | Status: DC

## 2017-08-03 MED ORDER — SODIUM CHLORIDE 0.9% FLUSH
10.0000 mL | INTRAVENOUS | Status: DC | PRN
  Administered 2017-08-03: 10 mL
  Filled 2017-08-03: qty 10

## 2017-08-03 MED ORDER — FLUTICASONE PROPIONATE 50 MCG/ACT NA SUSP: 2.0000 | Freq: Every day | NASAL | 0 refills | Status: DC

## 2017-08-03 NOTE — Patient Instructions (Signed)
[facility] Discharge Instructions for Patients Receiving Chemotherapy  Today you received the following chemotherapy agents :  Herceptin,  Xgeva.  To help prevent nausea and vomiting after your treatment, we encourage you to take your nausea medication as prescribed.   If you develop nausea and vomiting that is not controlled by your nausea medication, call the clinic.   BELOW ARE SYMPTOMS THAT SHOULD BE REPORTED IMMEDIATELY:  *FEVER GREATER THAN 100.5 F  *CHILLS WITH OR WITHOUT FEVER  NAUSEA AND VOMITING THAT IS NOT CONTROLLED WITH YOUR NAUSEA MEDICATION  *UNUSUAL SHORTNESS OF BREATH  *UNUSUAL BRUISING OR BLEEDING  TENDERNESS IN MOUTH AND THROAT WITH OR WITHOUT PRESENCE OF ULCERS  *URINARY PROBLEMS  *BOWEL PROBLEMS  UNUSUAL RASH Items with * indicate a potential emergency and should be followed up as soon as possible.  Feel free to call the clinic you have any questions or concerns. The clinic phone number is [phone].  Please show the [place] at check-in to the Emergency Department and triage nurse.

## 2017-08-03 NOTE — Progress Notes (Signed)
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  She is doing very well.  She is taking Anastrozole daily and tolerates it moderately well.  She does note joint aches in her feet, ankles, knees, shoulders and wrists.    REVIEW OF SYSTEMS: [name] is doing quite well.  Other than the joint aches/pains she denies any other issues.  She continues to smoke, and says that she could not afford to pick up the nicotine patches that Dr. [name] wrote for last week.  She denies any other issues, and a detailed ROS is otherwise non contributory. She is requesting a right breast screening mammogram today.   BREAST CANCER HISTORY: From the original intake note:  [name] was aware of a "lemon sized lump in" her left axilla for about a year before bringing it to medical attention. By then she had developed left breast and left axillary swelling (June 2016). She presented to the local emergency room and had a chest CT scan 06/06/2015 which showed a nodule in the left breast measuring 0.9 cm and questionable left axillary adenopathy. She then proceeded to bilateral diagnostic mammography and left breast ultrasonography 06/19/2015. There were no prior films for comparison (last mammography 12 years prior).. The breast density was category C. Mammography showed in the left breast upper inner quadrant a 7 cm  area including a small mass and significant pleomorphic calcifications. Ultrasonography defined the mass as measuring 1.2 cm. The left axilla appeared unremarkable. There was significant skin edema.  Biopsy of the left breast mass 06/19/2015 showed (SP [id number]) an invasive ductal carcinoma, grade 2, estrogen receptor 83% positive, progesterone receptor 26% positive, and HER-2 amplified by immunohistochemstry with a 3+ reading. The patient had biopsies of a separate area in the left breast August of the same year and this showed atypical ductal hyperplasia. (SP [id number]).  Accordingly after appropriate discussion on 08/21/2015 the patient proceeded to left mastectomy with left axillary sentinel lymph node sampling, which, since the lymph nodes were positive, extended to the procedure to left axillary lymph node dissection. The pathology (SP [id number]) showed an invasive ductal carcinoma, grade 3, measuring in excess of 9 cm. There were also skin satellites, not contiguous with the invasive carcinoma. Margins were clear and ample. There was evidence of lymphovascular invasion. A total of 15 lymph nodes were removed, including 5 sentinel lymph nodes, all of which were positive, so that the final total was 14 out of 15 lymph nodes involved by tumor. There was evidence of extranodal extension. The final pathology was pT4b pN2a, stage IIIB  CA-27-29 and CEA 09/19/2015 were non-informative October 2016.  Unfortunately CT scans of the chest abdomen and pelvis 09/16/2015 showed bony metastases to the right scapula, left iliac crest, and also L4 and T-spine. There were questionable liver cysts which on repeat CT scan 03/02/2016 appear to be a little bit more well-defined, possibly a little larger. There were also some possible right upper lobe lung lesions.  Adjuvant treatment consisted of docetaxel, trastuzumab and pertuzumab, with the final (6th) docetaxel dose given 02/11/2016. She continues on trastuzumab and  pertuzumab, with the 11th cycle given 05/05/2016. Echocardiogram 02/26/2016 showed an ejection fraction of 55%. She receives denosumab/Xgeva every 6 weeks.. She also receives radiation, started 06/09/201, to be completed 06/26/2016.  Her subsequent history is as detailed below   PAST MEDICAL HISTORY: Past Medical History:  Diagnosis Date  . Alcohol abuse   . Anemia    during chemo  . Anxiety    At age 8  . Arthritis Dx 2010  . Bipolar disorder ([place])   . Cancer (HCC)    breast  . Chronic pain   . Depression   . Fibromyalgia Dx 2005  . GERD (gastroesophageal reflux disease)   . Headache    hx  migraines  . Opiate dependence ([place])   . Port-a-cath in place   . PTSD (post-traumatic stress disorder)     PAST SURGICAL HISTORY: Past Surgical History:  Procedure Laterality Date  . APPLICATION OF CRANIAL NAVIGATION N/A 08/14/2016   Procedure: APPLICATION OF CRANIAL NAVIGATION;  Surgeon: Erline Levine, MD;  Location: [facility];  Service: Neurosurgery;  Laterality: N/A;  . BREAST RECONSTRUCTION Left    with silicone implant  . CRANIOTOMY N/A 08/14/2016   Procedure: CRANIOTOMY TUMOR EXCISION WITH Lucky Rathke;  Surgeon: Erline Levine, MD;  Location: [facility];  Service: Neurosurgery;  Laterality: N/A;  . FIBULA FRACTURE SURGERY    . MASTECTOMY Left   . RADIOLOGY WITH ANESTHESIA N/A 07/23/2016   Procedure: MRI OF BRAIN WITH AND WITHOUT;  Surgeon: Medication Radiologist, MD;  Location: [facility];  Service: Radiology;  Laterality: N/A;  . right power port placement Right     FAMILY HISTORY Family History  Problem Relation Age of Onset  . Diabetes Mother   . Bipolar disorder Mother   . CAD Father   The patient's father still living, age 61, in [place]. He had prostate cancer at some point in the past. The patient's mother died at age 7 from complications of diabetes. The patient had no brothers, 2 sisters. A paternal grandmother had lung cancer in the setting of tobacco abuse.  There is no other history of cancer in the family to her knowledge  GYNECOLOGIC HISTORY:  No LMP recorded. Patient has had an ablation. Menarche approximately age 9. First live birth in 41. The patient is GX P2. She underwent endometrial ablation in 2016.  SOCIAL HISTORY:  The patient is originally from [place]. She has lived in [place] before but more recently was in [place]. She is back here because she could not afford her rent in [place]. She is living here and a temporary situation. She is divorced. HER-2 children are [name] who lives in [place] and works as a Development worker, community, and [name] who also lives in [place] and works as a Catering manager. The patient has a grandchild, Arelia Longest, 24 years old as of July 2017, living in [place] with his mother. The patient has not established herself with a local church yet.    ADVANCED DIRECTIVES: Not in place; at the 06/03/2016 visit the patient was given the appropriate forms to complete and notarize at her discretion   HEALTH MAINTENANCE: Social History  Substance Use Topics  . Smoking status: Current Every Day Smoker    Packs/day: 0.50    Types: Cigarettes  . Smokeless tobacco: Never Used     Comment: Pt is on Chantix at present time  . Alcohol use  Yes     Comment: no ETOH since 08/22/12     Colonoscopy:  PAP:  Bone density:   Allergies  Allergen Reactions  . Demerol Itching and Nausea And Vomiting  . Erythromycin Rash    Can take zpak    Current Outpatient Prescriptions  Medication Sig Dispense Refill  . cetirizine (ZYRTEC) 10 MG tablet Take 1 tablet (10 mg total) by mouth daily. 30 tablet 11  . dexamethasone (DECADRON) 4 MG tablet Take 1 tablet (4 mg total) by mouth 2 (two) times daily. 60 tablet 0  . diphenoxylate-atropine (LOMOTIL) 2.5-0.025 MG tablet Take 2 tablets by mouth 4 (four) times daily as needed for diarrhea or loose stools. 40 tablet 0  . fluticasone (FLONASE) 50 MCG/ACT nasal spray Place 1 spray into  the nose daily.    [name] gabapentin (NEURONTIN) 300 MG capsule Take 2 capsules (600 mg total) by mouth 3 (three) times daily. 300 mg at night for one day, twice daily for one day, then three times daily 180 capsule 2  . hyaluronate sodium (RADIAPLEXRX) GEL Apply 1 application topically.    [name] HYDROcodone-acetaminophen (NORCO) 7.5-325 MG tablet Take 1-2 tablets by mouth every 6 (six) hours as needed for severe pain (give one tab, then if other tab is needed, may give 30 min later.). 60 tablet 0  . ibuprofen (ADVIL,MOTRIN) 200 MG tablet Take 800 mg by mouth every 6 (six) hours as needed for mild pain.    [name] lamoTRIgine (LAMICTAL) 100 MG tablet Take 1.5 tablets (150 mg total) by mouth 2 (two) times daily. 60 tablet 3  . lidocaine-prilocaine (EMLA) cream Apply 1 application topically as needed. 30 g 0  . lithium carbonate 300 MG capsule Take 1 capsule (300 mg total) by mouth 2 (two) times daily with a meal. 60 capsule 3  . Loperamide HCl (IMODIUM PO) Take 1 tablet by mouth daily as needed (diarhea).    . LORazepam (ATIVAN) 0.5 MG tablet Take 1 tablet (0.5 mg total) by mouth every 8 (eight) hours as needed for anxiety. 30 tablet 1  . ondansetron (ZOFRAN) 8 MG tablet Take 1 tablet (8 mg total) by mouth every 8 (eight) hours as needed for nausea or vomiting. 30 tablet 1  . pantoprazole (PROTONIX) 40 MG tablet Take 1 tablet (40 mg total) by mouth daily. 30 tablet 3  . polyethylene glycol powder (GLYCOLAX/MIRALAX) powder Take 17 g by mouth 2 (two) times daily as needed for mild constipation. (Patient not taking: Reported on 08/13/2016) 3350 g 3  . potassium chloride (K-DUR) 10 MEQ tablet Take 1 tablet (10 mEq total) by mouth daily. 30 tablet 3  . prochlorperazine (COMPAZINE) 10 MG tablet Take 10 mg by mouth daily as needed for nausea.     [name] tiZANidine (ZANAFLEX) 4 MG tablet Take 1 tablet (4 mg total) by mouth daily. 30 tablet 0  . traMADol (ULTRAM) 50 MG tablet Take 1 tablet (50 mg total) by mouth every 6 (six)  hours as needed. 60 tablet 0  . trazodone (DESYREL) 300 MG tablet Take 1 tablet (300 mg total) by mouth at bedtime. 30 tablet 3  . triamcinolone cream (KENALOG) 0.5 % Apply 1 application topically daily as needed.  1  . trolamine salicylate (ASPERCREME) 10 % cream Apply 1 application topically as needed for muscle pain. Reported on 06/01/2016    . varenicline (CHANTIX) 0.5 MG tablet Take 1 tablet (0.5 mg total) by mouth daily. 30 tablet 3  . Vitamin D, Cholecalciferol, 1000 units  CAPS Take 1,000 Units by mouth daily.     No current facility-administered medications for this visit.     OBJECTIVE:   Vitals:   08/03/17 0949  BP: (!) 129/48  Pulse: 66  Resp: 18  Temp: 97.9 F (36.6 C)  TempSrc: Oral  SpO2: 100%  Weight: 124 lb 4.8 oz (56.4 kg)  Height: 5' 5"  (1.651 m)  Body mass index is 20.68 kg/m. GENERAL: Patient is a well appearing female in no acute distress HEENT:  Sclerae anicteric.  Oropharynx clear and moist. No ulcerations or evidence of oropharyngeal candidiasis. Neck is supple.  NODES:  No cervical, supraclavicular, or axillary lymphadenopathy palpated.  BREAST EXAM:  Left breast is s/p mastectomy with implant placement, skin appers tight, however there is no swelling, no nodularity, or mass noted, right breast without nodules, masses, skin or nipple changes.   LUNGS:  Clear to auscultation bilaterally.  No wheezes or rhonchi. HEART:  Regular rate and rhythm. No murmur appreciated. ABDOMEN:  Soft, nontender.  Positive, normoactive bowel sounds. No organomegaly palpated. MSK:  No focal spinal tenderness to palpation. Full range of motion bilaterally in the upper extremities. EXTREMITIES:  No peripheral edema.   SKIN:  Clear with no obvious rashes or skin changes. No nail dyscrasia. NEURO:  Nonfocal. Well oriented.  Appropriate affect.     LAB RESULTS:  CMP     Component Value Date/Time   NA 140 08/13/2016 0926   NA 139 07/28/2016 1118   K 4.3 08/13/2016 0926   K  4.1 07/28/2016 1118   CL 107 08/13/2016 0926   CO2 25 08/13/2016 0926   CO2 26 07/28/2016 1118   GLUCOSE 96 08/13/2016 0926   GLUCOSE 91 07/28/2016 1118   BUN 8 08/13/2016 0926   BUN 8.6 07/28/2016 1118   CREATININE 0.94 08/13/2016 0926   CREATININE 0.8 07/28/2016 1118   CALCIUM 9.2 08/13/2016 0926   CALCIUM 9.7 07/28/2016 1118   PROT 6.7 07/28/2016 1118   ALBUMIN 4.0 07/28/2016 1118   AST 18 07/28/2016 1118   ALT 13 07/28/2016 1118   ALKPHOS 50 07/28/2016 1118   BILITOT <0.30 07/28/2016 1118   GFRNONAA >60 08/13/2016 0926   GFRAA >60 08/13/2016 0926    INo results found for: SPEP, UPEP  Lab Results  Component Value Date   WBC 5.0 08/13/2016   NEUTROABS 4.2 07/28/2016   HGB 12.9 08/13/2016   HCT 40.8 08/13/2016   MCV 96.9 08/13/2016   PLT 171 08/13/2016      Chemistry      Component Value Date/Time   NA 140 08/13/2016 0926   NA 139 07/28/2016 1118   K 4.3 08/13/2016 0926   K 4.1 07/28/2016 1118   CL 107 08/13/2016 0926   CO2 25 08/13/2016 0926   CO2 26 07/28/2016 1118   BUN 8 08/13/2016 0926   BUN 8.6 07/28/2016 1118   CREATININE 0.94 08/13/2016 0926   CREATININE 0.8 07/28/2016 1118      Component Value Date/Time   CALCIUM 9.2 08/13/2016 0926   CALCIUM 9.7 07/28/2016 1118   ALKPHOS 50 07/28/2016 1118   AST 18 07/28/2016 1118   ALT 13 07/28/2016 1118   BILITOT <0.30 07/28/2016 1118       No results found for: LABCA2  No components found for: LABCA125  No results for input(s): INR in the last 168 hours.  Urinalysis    Component Value Date/Time   COLORURINE YELLOW 04/18/2013 [place] 04/18/2013 1222  LABSPEC 1.011 04/18/2013 1222   PHURINE 5.5 04/18/2013 1222   GLUCOSEU NEGATIVE 04/18/2013 1222   HGBUR TRACE (A) 04/18/2013 1222   BILIRUBINUR negative 03/28/2015 1321   KETONESUR NEGATIVE 04/18/2013 1222   PROTEINUR negative 03/28/2015 1321   PROTEINUR NEGATIVE 04/18/2013 1222   UROBILINOGEN 0.2 03/28/2015 1321    UROBILINOGEN 0.2 04/18/2013 1222   NITRITE negative 03/28/2015 1321   NITRITE NEGATIVE 04/18/2013 1222   LEUKOCYTESUR Trace 03/28/2015 1321     STUDIES: Mr Jeri Cos [facility] Contrast  Result Date: 07/29/2017 CLINICAL DATA:  56 year old female with metastatic breast cancer. Status post resection of left cerebellar metastasis in September 2017. And whole brain radiation treated to 35 Gy in 14 fractions in October and November 2017. Restaging. EXAM: MRI HEAD WITHOUT AND WITH CONTRAST TECHNIQUE: Multiplanar, multiecho pulse sequences of the brain and surrounding structures were obtained without and with intravenous contrast. CONTRAST:  59m MULTIHANCE GADOBENATE DIMEGLUMINE 529 MG/ML IV SOLN COMPARISON:  Brain MRI 03/29/2017 and earlier. FINDINGS: Study performed under anesthesia. Brain: Subtle decreased cerebral volume since January. Increasing bilateral patchy and indistinct cerebral white matter T2 and FLAIR hyperintensity without associated mass effect or enhancement. Small resection site at the posterior left cerebellar hemisphere with resolved postoperative enhancement since January. Overlying craniotomy changes. No cerebellar edema. No dural thickening. No abnormal enhancement identified. No restricted diffusion to suggest acute infarction. No midline shift, mass effect, evidence of mass lesion, ventriculomegaly, extra-axial collection or acute intracranial hemorrhage. Cervicomedullary junction and pituitary are within normal limits. No chronic cerebral blood products. Vascular: Major intracranial vascular flow voids are stable. Skull and upper cervical spine: Stable and negative visualized cervical spine and spinal cord. Visualized bone marrow signal is within normal limits. Sinuses/Orbits: Stable an normal orbits soft tissues. Visualized paranasal sinuses and mastoids are stable and well pneumatized. Other: Visible internal auditory structures appear normal. Mild left suboccipital postoperative changes. No  acute scalp soft tissue findings. IMPRESSION: 1. Satisfactory post treatment appearance of the brain with no metastatic disease identified. 2. Increased cerebral white matter T2 and FLAIR hyperintensity as the sequelae of whole brain radiation. Electronically Signed   By: HGenevie AnnM.D.   On: 07/29/2017 10:53     ELIGIBLE FOR AVAILABLE RESEARCH PROTOCOL: no  ASSESSMENT: 56y.o. 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  (2) status post left mastectomy and axillary lymph node dissection  with immediate expander placement 07/18/2015 for an mpT4 pN2,stage IIIB invasive ductal carcinoma, grade 3, with negative margins.  (a) definitive implant exchange to be scheduled in December   METASTATIC DISEASE: October 2016  (3) CT scan of the chest abdomen and pelvis  09/16/2015 shows metastatic lesions in the right scapula, left iliac crest, L4, and T spine. There were questionable liver cysts, with repeat CT scan 03/02/2016 showing possible right upper lobe lung lesions and possibly increased liver lesions  (a) CT scan of the chest 06/17/2016 shows no active disease in the lungs or liver  (b) Bone scan July 2017 showed no evidence of bony metastatic disease   (c) head CT 07/08/2016 showed a cerebellar lesion, confirmed by MRI 07/23/2016, status post craniotomy 08/14/2016, confirming a metastatic deposit which was estrogen and progesterone receptor negative, HER-2 amplified with a signals ratio of 7.16, number per cell 13.25  (d) CA 27-29 not informative  (4) received docetaxel every  3 weeks 6 together with trastuzumab and pertuzumab, last docetaxel dose 02/11/2016  (5) adjuvant radiation7/03/2016 to 06/26/2016 at [facility]: 1. The Left chest wall was treated to 23.4 Gy in 13 fractions at 1.8 Gy per fraction. 2. The  Left chest wall was boosted to 10 Gy in 5 fractions at 2 Gy per fraction. 3. The Left Sclav/PAB was treated to 23.4 Gy in 13 fractions at 1.8 Gy per fraction.  Including the patient's treatment in [place] (received 15 fractions per Dr. Maryan Rued near [place], [place]), the patient received 50.4 Gy to the left chest wall and supraclavicular region.   (6) started trastuzumab and pertuzumab October 2016, continuing every 3 weeks,  (a) echocardiogram 02/26/2016 showed a well preserved ejection fraction  (b) echocardiogram 07/01/2016 shows an ejection fraction in the 60-65%   (c) pertuzumab discontinued 10/2016 with uncontrolled diarrhea  (d) echocardiogram 11/11/2016 showed an ejection fraction in the 60-65%  (e) echocardiogram 03/03/2017 shows an ejection fraction of 60-65%  (f) echocardiogram on 05/19/2017 shows an ejection fraction of 55-60%    (7) started denosumab/Xgeva October 2017, repeated every 6 weeks  (8) started anastrozole October 2017   (a) bone scan 11/10/2016 shows no active disease  (b) chest CT scan 11/10/2016 stable, with no evidence of active disease  (c) chest CT and bone scan 07/02/2017 show no evidence of active disease  (9) history of bipolar disorder  (a) currently on Lamictal and Latuda as well as Desiree L and Neurontin  (10) mild anemia with a significant drop in the MCV, ferritin 10 06/03/2016,   (a) Feraheme given 06/12/2016 and 06/18/2016  (11) tobacco abuse: Chantix started 06/18/2016--she is not currently trying to quit   (12) 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  (a) brain irradiation10/19/17-11/08/17: Whole brain/ 35 Gy in 14 fractions   (b) repeat brain MRI obtained 12/10/2016 shows no active disease in the brain  (c) repeat brain MRI 03/02/2017 shows no evidence of residual or recurrent disease  (d) repeat brain MRI 07/29/2017 shows no evidence of residual or recurrent disease   PLAN:  [name] is doing well  today.  Her labs so far today are normal and I reviewed that with her.  She continues to follow with the cardio oncology clinic and her echo was last on 05/19/2017.  It was normal, her next one is scheduled later this month.  She and I reviewed in detail her MRI brain results which did not demonstrate any further disease.  I reviewed the current plan with her, her upcoming appointments, and the fact that she will be restaged around November/December.  [name] verbalized understanding of the plan.  She will proceed with treatment today with Trastuzumab and Denosumab Delton See).  She will continue taking Anastrozole.  I did recommend she try yoga for her joint aches and pains.    She knows to call for any other problems that may develop before the next visit.  A total of (30) minutes of face-to-face time was spent with this patient with greater than 50% of that time in counseling and care-coordination.   Scot Dock, NP Medical Oncology and Hematology [facility] [address] [place], [place] [address] Tel. [phone]    Fax. [phone]

## 2017-08-04 LAB — CANCER ANTIGEN 27.29: CA 27.29: 8.5 U/mL (ref 0.0–38.6)

## 2017-08-09 ENCOUNTER — Encounter: Payer: Self-pay | Admitting: Internal Medicine

## 2017-08-09 ENCOUNTER — Ambulatory Visit (HOSPITAL_BASED_OUTPATIENT_CLINIC_OR_DEPARTMENT_OTHER): Payer: Medicaid Other | Admitting: Internal Medicine

## 2017-08-09 ENCOUNTER — Encounter (INDEPENDENT_AMBULATORY_CARE_PROVIDER_SITE_OTHER): Payer: Self-pay

## 2017-08-09 ENCOUNTER — Ambulatory Visit: Payer: Medicaid Other | Admitting: Radiation Oncology

## 2017-08-09 VITALS — BP 128/80 | HR 70 | Temp 99.0°F | Resp 18 | Wt 124.2 lb

## 2017-08-09 DIAGNOSIS — C7931 Secondary malignant neoplasm of brain: Secondary | ICD-10-CM

## 2017-08-09 DIAGNOSIS — Z170 Estrogen receptor positive status [ER+]: Secondary | ICD-10-CM

## 2017-08-09 DIAGNOSIS — C7951 Secondary malignant neoplasm of bone: Secondary | ICD-10-CM | POA: Diagnosis not present

## 2017-08-09 DIAGNOSIS — C50919 Malignant neoplasm of unspecified site of unspecified female breast: Secondary | ICD-10-CM

## 2017-08-09 DIAGNOSIS — R51 Headache: Secondary | ICD-10-CM | POA: Diagnosis present

## 2017-08-09 DIAGNOSIS — C50312 Malignant neoplasm of lower-inner quadrant of left female breast: Secondary | ICD-10-CM

## 2017-08-09 DIAGNOSIS — G8929 Other chronic pain: Secondary | ICD-10-CM

## 2017-08-09 DIAGNOSIS — R519 Headache, unspecified: Secondary | ICD-10-CM

## 2017-08-09 NOTE — Progress Notes (Signed)
[facility] [place], [place] [address] [phone]   New Patient Evaluation  Date of Service: 08/09/17 Patient Name: [name] Patient MRN: [id number] Patient DOB: 20-Jun-1961 Provider: Ventura Sellers, MD  Identifying Statement:  [name] is a 56 y.o. female with Brain metastasis ([place])  Metastatic breast cancer ([place])  Chronic nonintractable headache, unspecified headache type who presents for initial consultation and evaluation regarding cancer associated neurologic deficits.    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  From the original intake note:  [name] was aware of a "lemon sized lump in" her left axilla for about a year before bringing it to medical attention. By then she had developed left breast and left axillary swelling (June 2016). She presented to the local emergency room and had a chest CT scan 06/06/2015 which showed a nodule in the left breast measuring 0.9 cm and questionable left axillary adenopathy. She then proceeded to bilateral diagnostic mammography and left breast ultrasonography 06/19/2015. There were no prior films for comparison (last mammography 12 years prior).. The breast density was category C. Mammography showed in the left breast upper inner quadrant a 7 cm area including a small mass and significant pleomorphic calcifications. Ultrasonography defined the mass as measuring 1.2 cm. The left axilla appeared unremarkable. There was significant skin edema.  Biopsy of the left breast mass 06/19/2015 showed (SP [id number]) an invasive ductal carcinoma, grade 2, estrogen receptor 83% positive, progesterone receptor 26% positive, and HER-2 amplified by  immunohistochemstry with a 3+ reading. The patient had biopsies of a separate area in the left breast August of the same year and this showed atypical ductal hyperplasia. (SP [id number]).  Accordingly after appropriate discussion on 08/21/2015 the patient proceeded to left mastectomy with left axillary sentinel lymph node sampling, which, since the lymph nodes were positive, extended to the procedure to left axillary lymph node dissection. The pathology (SP [id number]) showed an invasive ductal carcinoma, grade 3, measuring in excess of 9 cm. There were also skin satellites, not contiguous with the invasive carcinoma. Margins were clear and ample. There was evidence of lymphovascular invasion. A total of 15 lymph nodes were removed, including 5 sentinel lymph nodes, all of which were positive, so that the final total was 14 out of 15 lymph nodes involved by tumor. There was evidence of extranodal extension. The final pathology was pT4b pN2a, stage IIIB  CA-27-29 and CEA 09/19/2015 were non-informative October 2016.  Unfortunately CT scans of the chest abdomen and pelvis 09/16/2015 showed bony metastases to the right scapula, left iliac crest, and also L4 and T-spine. There were questionable liver cysts which on repeat CT scan 03/02/2016 appear to be a little bit more well-defined, possibly a little larger. There were also some possible right upper lobe lung lesions.  Adjuvant treatment consisted of docetaxel, trastuzumab and pertuzumab, with the final (6th) docetaxel dose given 02/11/2016. She continues on trastuzumab and pertuzumab, with the 11th cycle given 05/05/2016. Echocardiogram 02/26/2016 showed an ejection fraction of 55%. She receives denosumab/Xgeva every 6 weeks.. She also receives radiation, started 06/09/201, to be completed 06/26/2016.  09/17/16-10/07/16: Whole brain treated to 35 Gy in 14 fractions  History of Present Illness: The patient's records from the referring physician  were  obtained and reviewed and the patient interviewed to confirm this HPI.  [name] presents today to discuss her headaches, which have been a complication of her metastatic breast cancer and its treatment.  The headaches are described as a daily 5/10 pressure/pounding starting in the shoulders and migrating up to top of head, which last up to the entire day, and are accompanied by some photophobia and phonophobia but no nausea.  There is no specific time of day or positional predilection.  The headaches have been consistent for the past year or so, but she had frequent headaches in the past, including history of migraines (also migraines in mother and sister).  She has had periods in the past of high oral narcotic use and even abuse, although she denies use of narcotics at present.    To treat the pain she has been taking ibuprofen 3-4x per day, each day, as well as tylenol/aspiring/caffeine powder.  She has also been on Neurontin at a stable dose for years.  Previously she had been taking Fioricet but has since weaned that down.    Currently she denies issues with her gait as prior.  No seizures, double vision, weakness, numbness.      Current Outpatient Prescriptions on File Prior to Visit  Medication Sig Dispense Refill  . anastrozole (ARIMIDEX) 1 MG tablet Take 1 tablet (1 mg total) by mouth daily. 90 tablet 1  . Aspirin-Acetaminophen-Caffeine (GOODY HEADACHE PO) Take 1 packet by mouth 3 (three) times daily as needed (headaches).    [name] BIOTIN PO Take 1 tablet by mouth daily.    . butalbital-acetaminophen-caffeine (FIORICET WITH CODEINE) 50-325-40-30 MG capsule TAKE 1 CAPSULE BY MOUTH EVERY 12 HOURS AS NEEDED FOR HEADACHE 40 capsule 1  . Cholecalciferol (VITAMIN D3) 5000 units CAPS Take 5,000 Units by mouth daily.    . cycloSPORINE (RESTASIS) 0.05 % ophthalmic emulsion Place 1 drop into both eyes 2 (two) times daily.    [name] dextromethorphan-guaiFENesin (MUCINEX DM) 30-600 MG 12hr tablet Take 1 tablet  by mouth 2 (two) times daily. 30 tablet 1  . docusate sodium (COLACE) 100 MG capsule Take 100 mg by mouth daily.    . fluticasone (FLONASE) 50 MCG/ACT nasal spray Place 2 sprays into both nostrils daily. 16 g 0  . gabapentin (NEURONTIN) 300 MG capsule Take 1 capsule (300 mg total) by mouth 2 (two) times daily. 60 capsule 3  . guaiFENesin (MUCINEX) 600 MG 12 hr tablet Take 600 mg by mouth 2 (two) times daily. May take an additional 600 mg twice daily as needed for congestion    . ibuprofen (ADVIL,MOTRIN) 800 MG tablet Take 1 tablet (800 mg total) by mouth 3 (three) times daily. 90 tablet 0  . lamoTRIgine (LAMICTAL) 25 MG tablet Take 1 tablet (25 mg total) by mouth 2 (two) times daily. (Patient taking differently: Take 50 mg by mouth daily. ) 60 tablet 0  . loratadine (CLARITIN) 10 MG tablet Take 1 tablet (10 mg total) by mouth daily. 30 tablet 11  . lurasidone (LATUDA) 40 MG TABS tablet Take 1 tablet (40 mg total) by mouth daily with breakfast. 30 tablet 0  . ondansetron (ZOFRAN) 8 MG tablet Take 1 tablet (8 mg total) by mouth every 8 (eight) hours as needed for nausea or vomiting. (Patient taking differently: Take 8 mg by mouth 2 (two) times daily. ) 90 tablet 1  . pantoprazole (PROTONIX) 40 MG tablet Take 1 tablet (40 mg total) by mouth daily. 30 tablet 3  .  polyethylene glycol (MIRALAX / GLYCOLAX) packet Take 17 g by mouth daily as needed for mild constipation. 30 each 1  . traZODone (DESYREL) 100 MG tablet Take 1 tablet (100 mg total) by mouth at bedtime. (Patient taking differently: Take 200 mg by mouth at bedtime. ) 30 tablet 0   Current Facility-Administered Medications on File Prior to Visit  Medication Dose Route Frequency Provider Last Rate Last Dose  . betamethasone acetate-betamethasone sodium phosphate (CELESTONE) injection 3 mg  3 mg Intramuscular Once Edrick Kins, DPM        Allergies:  Allergies  Allergen Reactions  . Demerol Itching and Nausea And Vomiting  . Erythromycin  Rash   Past Medical History:  Past Medical History:  Diagnosis Date  . Alcohol abuse   . Anemia    during chemo  . Anxiety    At age 83  . Arthritis Dx 2010  . Bipolar disorder ([place])   . Cancer ([place])    breast mets to brain  . Chronic pain   . Complication of anesthesia   . Depression   . Fibromyalgia Dx 2005  . GERD (gastroesophageal reflux disease)   . Headache    hx  migraines  . Opiate dependence ([place])   . PONV (postoperative nausea and vomiting)   . Port-a-cath in place   . PTSD (post-traumatic stress disorder)    Past Surgical History:  Past Surgical History:  Procedure Laterality Date  . APPLICATION OF CRANIAL NAVIGATION N/A 08/14/2016   Procedure: APPLICATION OF CRANIAL NAVIGATION;  Surgeon: Erline Levine, MD;  Location: [facility];  Service: Neurosurgery;  Laterality: N/A;  . BREAST RECONSTRUCTION Left    with silicone implant  . CRANIOTOMY N/A 08/14/2016   Procedure: CRANIOTOMY TUMOR EXCISION WITH Lucky Rathke;  Surgeon: Erline Levine, MD;  Location: [facility];  Service: Neurosurgery;  Laterality: N/A;  . FIBULA FRACTURE SURGERY Left   . MASTECTOMY Left   . RADIOLOGY WITH ANESTHESIA N/A 07/23/2016   Procedure: MRI OF BRAIN WITH AND WITHOUT;  Surgeon: Medication Radiologist, MD;  Location: [facility];  Service: Radiology;  Laterality: N/A;  . RADIOLOGY WITH ANESTHESIA N/A 09/08/2016   Procedure: MRI OF BRAIN WITH AND WITHOUT CONTRAST;  Surgeon: Medication Radiologist, MD;  Location: [facility];  Service: Radiology;  Laterality: N/A;  . RADIOLOGY WITH ANESTHESIA N/A 12/10/2016   Procedure: MRI OF BRAIN WITH AND WITHOUT;  Surgeon: Medication Radiologist, MD;  Location: [facility];  Service: Radiology;  Laterality: N/A;  . RADIOLOGY WITH ANESTHESIA N/A 03/02/2017   Procedure: MRI of BRAIN W and W/OUT CONTRAST;  Surgeon: Medication Radiologist, MD;  Location: [facility];  Service: Radiology;  Laterality: N/A;  . RADIOLOGY WITH ANESTHESIA N/A 07/29/2017   Procedure: RADIOLOGY WITH ANESTHESIA  MRI OF BRAIN WITH AND WITHOUT CONTRAST;  Surgeon: Radiologist, Medication, MD;  Location: [facility];  Service: Radiology;  Laterality: N/A;  . right power port placement Right    Social History:  Social History   Social History  . Marital status: Single    Spouse name: N/A  . Number of children: N/A  . Years of education: N/A   Occupational History  . Not on file.   Social History Main Topics  . Smoking status: Current Every Day Smoker    Packs/day: 1.00    Types: Cigarettes  . Smokeless tobacco: Never Used  . Alcohol use No     Comment: no ETOH since 08/22/12  . Drug use: No     Comment: states she's  in recovery program for 5 years  . Sexual activity: No     Comment: ablation   Other Topics Concern  . Not on file   Social History Narrative  . No narrative on file   Family History:  Family History  Problem Relation Age of Onset  . Diabetes Mother   . Bipolar disorder Mother   . CAD Father     Review of Systems: Constitutional: Denies fevers, chills or abnormal weight loss Eyes: Denies blurriness of vision Ears, nose, mouth, throat, and face: Denies mucositis or sore throat Respiratory: Denies cough, dyspnea or wheezes Cardiovascular: Denies palpitation, chest discomfort or lower extremity swelling Gastrointestinal:  occassional nausea GU: Denies dysuria or incontinence Skin: Denies abnormal skin rashes Neurological: Per HPI Musculoskeletal: body pain Behavioral/Psych: +anxiety  Physical Exam: Vitals:   08/09/17 0908  BP: 128/80  Pulse: 70  Resp: 18  Temp: 99 F (37.2 C)  SpO2: 98%   KPS: 90. General: Alert, cooperative, pleasant, in no acute distress Head: Normal EENT: No conjunctival injection or scleral icterus. Oral mucosa moist Lungs: Resp effort normal Cardiac: Regular rate and rhythm Abdomen: Soft, non-distended abdomen Skin: No rashes cyanosis or petechiae. Extremities: No clubbing or edema  Neurologic Exam: Mental Status: Awake, alert,  attentive to examiner. Oriented to self and environment. Language is fluent with intact comprehension.  Cranial Nerves: Visual acuity is grossly normal. Visual fields are full. Extra-ocular movements intact. No ptosis. Face is symmetric, tongue midline. Motor: Tone and bulk are normal. Power is full in both arms and legs. Reflexes are symmetric, no pathologic reflexes present.  Coordination: Mild dysmetria on finger to nose bilaterally.   Sensory: Intact to light touch and temperature Gait: Normal and tandem gait is normal.   Labs: I have reviewed the data as listed    Component Value Date/Time   NA 140 08/03/2017 0936   K 3.5 08/03/2017 0936   CL 109 07/29/2017 0655   CO2 25 08/03/2017 0936   GLUCOSE 107 08/03/2017 0936   BUN 9.0 08/03/2017 0936   CREATININE 1.0 08/03/2017 0936   CALCIUM 9.6 08/03/2017 0936   PROT 6.6 08/03/2017 0936   ALBUMIN 4.0 08/03/2017 0936   AST 17 08/03/2017 0936   ALT 15 08/03/2017 0936   ALKPHOS 49 08/03/2017 0936   BILITOT <0.22 08/03/2017 0936   GFRNONAA >60 07/29/2017 0655   GFRAA >60 07/29/2017 0655   Lab Results  Component Value Date   WBC 4.7 08/03/2017   NEUTROABS 3.4 08/03/2017   HGB 13.6 08/03/2017   HCT 39.7 08/03/2017   MCV 97.1 08/03/2017   PLT 170 08/03/2017    Imaging: Mr Jeri Cos OT Contrast  Result Date: 07/29/2017 CLINICAL DATA:  56 year old female with metastatic breast cancer. Status post resection of left cerebellar metastasis in September 2017. And whole brain radiation treated to 35 Gy in 14 fractions in October and November 2017. Restaging. EXAM: MRI HEAD WITHOUT AND WITH CONTRAST TECHNIQUE: Multiplanar, multiecho pulse sequences of the brain and surrounding structures were obtained without and with intravenous contrast. CONTRAST:  90m MULTIHANCE GADOBENATE DIMEGLUMINE 529 MG/ML IV SOLN COMPARISON:  Brain MRI 03/29/2017 and earlier. FINDINGS: Study performed under anesthesia. Brain: Subtle decreased cerebral volume since  January. Increasing bilateral patchy and indistinct cerebral white matter T2 and FLAIR hyperintensity without associated mass effect or enhancement. Small resection site at the posterior left cerebellar hemisphere with resolved postoperative enhancement since January. Overlying craniotomy changes. No cerebellar edema. No dural thickening. No abnormal enhancement identified. No restricted  diffusion to suggest acute infarction. No midline shift, mass effect, evidence of mass lesion, ventriculomegaly, extra-axial collection or acute intracranial hemorrhage. Cervicomedullary junction and pituitary are within normal limits. No chronic cerebral blood products. Vascular: Major intracranial vascular flow voids are stable. Skull and upper cervical spine: Stable and negative visualized cervical spine and spinal cord. Visualized bone marrow signal is within normal limits. Sinuses/Orbits: Stable an normal orbits soft tissues. Visualized paranasal sinuses and mastoids are stable and well pneumatized. Other: Visible internal auditory structures appear normal. Mild left suboccipital postoperative changes. No acute scalp soft tissue findings. IMPRESSION: 1. Satisfactory post treatment appearance of the brain with no metastatic disease identified. 2. Increased cerebral white matter T2 and FLAIR hyperintensity as the sequelae of whole brain radiation. Electronically Signed   By: Genevie Ann M.D.   On: 07/29/2017 10:53   I have personally reviewed the radiological images as listed and agreed with the findings in the report.  Assessment/Plan  1. Brain metastasis ([place]) [name] is clinically and radiographically stable following resection and WBRT.  She does have some residual fine motor impairments which are noted here.  She should continue to follow up with a 3 month interval post-RT MRI brain.  We will review subsequent imaging in tumor board and in clinic for follow up.  2. Chronic nonintractable headache, unspecified  headache type Headache type is consistent with medication/analgesia overuse, attributed to mostly to Ibuprofen.  Headaches are likely not related to her CNS structural lesions or primary cancer related pain, and there are few migrainous components  We recommend very gradually withdrawing analgesia, starting with Ibuprofen.  She should reduce to 2 pills per day if tolerated.  If needed, we can prescribe a solumedrol dose pack to alleviate the analgesia withdrawal process.  In addition, we could consider adding Topiramate, but would like to withhold this intervention for now until patient has de-escalated on her own somewhat.  To ensure success of this intervention, she will also continue to work on smoking cessation, keep a balanced diet without meal skipping, maintaining adequate sleep volume, and keep stress levels moderated.   She should follow up with [place] in 6 weeks to monitor progress and address issues with analgesia withdrawal.    We appreciate the opportunity to participate in the care of [name].   We spent twenty additional minutes teaching regarding the natural history, biology, and historical experience in the treatment of neurologic complications of cancer. We also provided teaching sheets for the patient to take home as an additional resource.  All questions were answered. The patient knows to call the clinic with any problems, questions or concerns. No barriers to learning were detected.  I spent 40 minutes counseling the patient face to face. The total time spent in the appointment was 50 minutes and more than 50% was on counseling and review of test results   Ventura Sellers, MD Medical Director of Neuro-Oncology [facility] at [facility] 08/09/17 8:57 AM

## 2017-08-10 ENCOUNTER — Other Ambulatory Visit: Payer: Self-pay | Admitting: Radiation Therapy

## 2017-08-10 ENCOUNTER — Other Ambulatory Visit: Payer: Self-pay | Admitting: *Deleted

## 2017-08-10 DIAGNOSIS — C50312 Malignant neoplasm of lower-inner quadrant of left female breast: Secondary | ICD-10-CM

## 2017-08-10 DIAGNOSIS — C7931 Secondary malignant neoplasm of brain: Secondary | ICD-10-CM

## 2017-08-10 MED ORDER — FLUTICASONE PROPIONATE 50 MCG/ACT NA SUSP: 2.0000 | Freq: Every day | NASAL | 0 refills | Status: DC

## 2017-08-10 NOTE — Telephone Encounter (Signed)
Call from pt: [facility] never received [facility]. Noted it was printed. Pt does not have prescription. [facility].

## 2017-08-13 ENCOUNTER — Ambulatory Visit (HOSPITAL_COMMUNITY): Payer: Medicaid Other

## 2017-08-13 ENCOUNTER — Encounter (HOSPITAL_COMMUNITY): Payer: Self-pay | Admitting: Internal Medicine

## 2017-08-16 ENCOUNTER — Ambulatory Visit: Payer: Self-pay | Admitting: Podiatry

## 2017-08-23 ENCOUNTER — Telehealth: Payer: Self-pay

## 2017-08-23 ENCOUNTER — Ambulatory Visit: Payer: Self-pay | Admitting: Family Medicine

## 2017-08-23 ENCOUNTER — Other Ambulatory Visit: Payer: Self-pay | Admitting: Internal Medicine

## 2017-08-23 NOTE — Progress Notes (Signed)
Called in 6 day medrol dose pack to [facility] for breakthrough headache, per our discussion in clinic visit on 08/09/17  Ventura Sellers, MD

## 2017-08-23 NOTE — Telephone Encounter (Signed)
Pt called that she has been hurting really bad over the weekend. She has increased her use of ibuprofen 3x today and 3x yesterday, prior to that was 2x/day. 3 goody powders today and 4 yesterday. She was barely able to get home from a picnic on Sunday.  Dr Mickeal Skinner had mentioned a dose pack and adding topomax. Pt is asking we contact Dr Mickeal Skinner about this. Pain is head behind eyes and travels over head down neck into shoulders. 7/10 right now. Pressure and nagging ache. Hurt back last week at work and has sciatica from it.  [facility]

## 2017-08-24 ENCOUNTER — Telehealth: Payer: Self-pay | Admitting: *Deleted

## 2017-08-24 NOTE — Telephone Encounter (Signed)
Dr. Mickeal Skinner unable to manually enter steroid dose pack into medication refill order form, therefore he called in Rx to patients pharmacy.    Spoke with patient and confirmed that she did pick up the Rx and started taking it.

## 2017-08-31 ENCOUNTER — Encounter: Payer: Self-pay | Admitting: Adult Health

## 2017-08-31 ENCOUNTER — Encounter: Payer: Self-pay | Admitting: *Deleted

## 2017-08-31 ENCOUNTER — Other Ambulatory Visit (HOSPITAL_BASED_OUTPATIENT_CLINIC_OR_DEPARTMENT_OTHER): Payer: Medicaid Other

## 2017-08-31 ENCOUNTER — Ambulatory Visit (HOSPITAL_BASED_OUTPATIENT_CLINIC_OR_DEPARTMENT_OTHER): Payer: Medicaid Other | Admitting: Adult Health

## 2017-08-31 ENCOUNTER — Ambulatory Visit (HOSPITAL_BASED_OUTPATIENT_CLINIC_OR_DEPARTMENT_OTHER): Payer: Medicaid Other

## 2017-08-31 VITALS — BP 107/60 | HR 70 | Temp 98.2°F | Resp 17 | Ht 65.0 in | Wt 126.7 lb

## 2017-08-31 DIAGNOSIS — Z79811 Long term (current) use of aromatase inhibitors: Secondary | ICD-10-CM

## 2017-08-31 DIAGNOSIS — C50312 Malignant neoplasm of lower-inner quadrant of left female breast: Secondary | ICD-10-CM

## 2017-08-31 DIAGNOSIS — F17200 Nicotine dependence, unspecified, uncomplicated: Secondary | ICD-10-CM

## 2017-08-31 DIAGNOSIS — C7931 Secondary malignant neoplasm of brain: Secondary | ICD-10-CM

## 2017-08-31 DIAGNOSIS — C7951 Secondary malignant neoplasm of bone: Secondary | ICD-10-CM | POA: Diagnosis not present

## 2017-08-31 DIAGNOSIS — Z5112 Encounter for antineoplastic immunotherapy: Secondary | ICD-10-CM

## 2017-08-31 DIAGNOSIS — Z170 Estrogen receptor positive status [ER+]: Secondary | ICD-10-CM | POA: Diagnosis not present

## 2017-08-31 DIAGNOSIS — Z23 Encounter for immunization: Secondary | ICD-10-CM | POA: Diagnosis not present

## 2017-08-31 LAB — COMPREHENSIVE METABOLIC PANEL
ALT: 15 U/L (ref 0–55)
AST: 16 U/L (ref 5–34)
Albumin: 3.8 g/dL (ref 3.5–5.0)
Alkaline Phosphatase: 42 U/L (ref 40–150)
Anion Gap: 8 mEq/L (ref 3–11)
BUN: 7.5 mg/dL (ref 7.0–26.0)
CO2: 28 mEq/L (ref 22–29)
Calcium: 9.5 mg/dL (ref 8.4–10.4)
Chloride: 104 mEq/L (ref 98–109)
Creatinine: 0.9 mg/dL (ref 0.6–1.1)
EGFR: 72 mL/min/{1.73_m2} — ABNORMAL LOW (ref >90)
Glucose: 132 mg/dl (ref 70–140)
Potassium: 3.8 mEq/L (ref 3.5–5.1)
Sodium: 139 mEq/L (ref 136–145)
Total Bilirubin: <0.22 mg/dL (ref 0.20–1.20)
Total Protein: 6.1 g/dL — ABNORMAL LOW (ref 6.4–8.3)

## 2017-08-31 LAB — CBC WITH DIFFERENTIAL/PLATELET
BASO%: 0.4 % (ref 0.0–2.0)
Basophils Absolute: 0 10*3/uL (ref 0.0–0.1)
EOS%: 1.3 % (ref 0.0–7.0)
Eosinophils Absolute: 0.1 10*3/uL (ref 0.0–0.5)
HCT: 36.9 % (ref 34.8–46.6)
HGB: 12.5 g/dL (ref 11.6–15.9)
LYMPH%: 21.1 % (ref 14.0–49.7)
MCH: 33 pg (ref 25.1–34.0)
MCHC: 33.9 g/dL (ref 31.5–36.0)
MCV: 97.4 fL (ref 79.5–101.0)
MONO#: 0.4 10*3/uL (ref 0.1–0.9)
MONO%: 7.8 % (ref 0.0–14.0)
NEUT#: 3.1 10*3/uL (ref 1.5–6.5)
NEUT%: 69.4 % (ref 38.4–76.8)
Platelets: 133 10*3/uL — ABNORMAL LOW (ref 145–400)
RBC: 3.79 10*6/uL (ref 3.70–5.45)
RDW: 12.8 % (ref 11.2–14.5)
WBC: 4.5 10*3/uL (ref 3.9–10.3)
lymph#: 0.9 10*3/uL (ref 0.9–3.3)

## 2017-08-31 MED ORDER — INFLUENZA VAC SPLIT QUAD 0.5 ML IM SUSY
0.5000 mL | PREFILLED_SYRINGE | Freq: Once | INTRAMUSCULAR | Status: AC
  Administered 2017-08-31: 0.5 mL via INTRAMUSCULAR
  Filled 2017-08-31: qty 0.5

## 2017-08-31 MED ORDER — ACETAMINOPHEN 325 MG PO TABS: 650.0000 mg | ORAL_TABLET | Freq: Once | ORAL | Status: DC

## 2017-08-31 MED ORDER — SODIUM CHLORIDE 0.9% FLUSH
10.0000 mL | INTRAVENOUS | Status: DC | PRN
  Administered 2017-08-31: 10 mL
  Filled 2017-08-31: qty 10

## 2017-08-31 MED ORDER — HEPARIN SOD (PORK) LOCK FLUSH 100 UNIT/ML IV SOLN
500.0000 [IU] | Freq: Once | INTRAVENOUS | Status: AC | PRN
  Administered 2017-08-31: 500 [IU]
  Filled 2017-08-31: qty 5

## 2017-08-31 MED ORDER — DENOSUMAB 120 MG/1.7ML ~~LOC~~ SOLN
120.0000 mg | Freq: Once | SUBCUTANEOUS | Status: AC
  Administered 2017-08-31: 120 mg via SUBCUTANEOUS
  Filled 2017-08-31: qty 1.7

## 2017-08-31 MED ORDER — DIPHENHYDRAMINE HCL 25 MG PO CAPS: 25.0000 mg | ORAL_CAPSULE | Freq: Once | ORAL | Status: DC

## 2017-08-31 MED ORDER — TRASTUZUMAB CHEMO 150 MG IV SOLR
450.0000 mg | Freq: Once | INTRAVENOUS | Status: AC
  Administered 2017-08-31: 450 mg via INTRAVENOUS
  Filled 2017-08-31: qty 21.43

## 2017-08-31 MED ORDER — SODIUM CHLORIDE 0.9 % IV SOLN
Freq: Once | INTRAVENOUS | Status: AC
  Administered 2017-08-31: 11:00:00 via INTRAVENOUS

## 2017-08-31 NOTE — Progress Notes (Signed)
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  She is taking Anastrozole daily and continues to tolerate it well.  She did previously see Dr. Mickeal Hunter.  She has stopped Fioricet daily.  She has decreased to taking 2 ibuprofen a day and is doing well with this.    REVIEW OF SYSTEMS: [name] is doing well today.  She is not having any new pain or issues.  She did miss her echocardiogram because of the weather ([place]).  She was rescheduled to later this month.  She does not have any chest pain, fevers, swelling, DOE, or any other concerns.  A detailed ROS was non contributory.    BREAST CANCER HISTORY: From the original intake note:  [name] was aware of a "lemon sized lump in" her left axilla for about a year before bringing it to medical attention. By then she had developed left breast and left axillary swelling (June 2016). She presented to the local emergency room and had a chest CT scan 06/06/2015 which showed a nodule in the left breast measuring 0.9 cm and questionable left axillary adenopathy. She then proceeded to bilateral diagnostic mammography and left breast ultrasonography 06/19/2015. There were no prior films for comparison (last mammography 12 years prior).. The breast density was category C. Mammography showed in the left breast upper inner quadrant a 7 cm area  including a small mass and significant pleomorphic calcifications. Ultrasonography defined the mass as measuring 1.2 cm. The left axilla appeared unremarkable. There was significant skin edema.  Biopsy of the left breast mass 06/19/2015 showed (SP [id number]) an invasive ductal carcinoma, grade 2, estrogen receptor 83% positive, progesterone receptor 26% positive, and HER-2 amplified by immunohistochemstry with a 3+ reading. The patient had biopsies of a separate area in the left breast August of the same year and this showed atypical ductal hyperplasia. (SP [id number]).  Accordingly after appropriate discussion on 08/21/2015 the patient proceeded to left mastectomy with left axillary sentinel lymph node sampling, which, since the lymph nodes were positive, extended to the procedure to left axillary lymph node dissection. The pathology (SP [id number]) showed an invasive ductal carcinoma, grade 3, measuring in excess of 9 cm. There were also skin satellites, not contiguous with the invasive carcinoma. Margins were clear and ample. There was evidence of lymphovascular invasion. A total of 15 lymph nodes were removed, including 5 sentinel lymph nodes, all of which were positive, so that the final total was 14 out of 15 lymph nodes involved by tumor. There was evidence of extranodal extension. The final pathology was pT4b pN2a, stage IIIB  CA-27-29 and CEA 09/19/2015 were non-informative October 2016.  Unfortunately CT scans of the chest abdomen and pelvis 09/16/2015 showed bony metastases to the right scapula, left iliac crest, and also L4 and T-spine. There were questionable liver cysts which on repeat CT scan 03/02/2016 appear to be a little bit more well-defined, possibly a little larger. There were also some possible right  upper lobe lung lesions.  Adjuvant treatment consisted of docetaxel, trastuzumab and pertuzumab, with the final (6th) docetaxel dose given 02/11/2016. She continues on trastuzumab and  pertuzumab, with the 11th cycle given 05/05/2016. Echocardiogram 02/26/2016 showed an ejection fraction of 55%. She receives denosumab/Xgeva every 4 weeks.. She also receives radiation, started 06/09/201, to be completed 06/26/2016.  Her subsequent history is as detailed below   PAST MEDICAL HISTORY: Past Medical History:  Diagnosis Date  . Alcohol abuse   . Anemia    during chemo  . Anxiety    At age 46  . Arthritis Dx 2010  . Bipolar disorder ([place])   . Cancer (HCC)    breast  . Chronic pain   . Depression   . Fibromyalgia Dx 2005  . GERD (gastroesophageal reflux disease)   . Headache    hx  migraines  . Opiate dependence ([place])   . Port-a-cath in place   . PTSD (post-traumatic stress disorder)     PAST SURGICAL HISTORY: Past Surgical History:  Procedure Laterality Date  . APPLICATION OF CRANIAL NAVIGATION N/A 08/14/2016   Procedure: APPLICATION OF CRANIAL NAVIGATION;  Surgeon: Erline Levine, MD;  Location: [facility];  Service: Neurosurgery;  Laterality: N/A;  . BREAST RECONSTRUCTION Left    with silicone implant  . CRANIOTOMY N/A 08/14/2016   Procedure: CRANIOTOMY TUMOR EXCISION WITH Lucky Rathke;  Surgeon: Erline Levine, MD;  Location: [facility];  Service: Neurosurgery;  Laterality: N/A;  . FIBULA FRACTURE SURGERY    . MASTECTOMY Left   . RADIOLOGY WITH ANESTHESIA N/A 07/23/2016   Procedure: MRI OF BRAIN WITH AND WITHOUT;  Surgeon: Medication Radiologist, MD;  Location: [facility];  Service: Radiology;  Laterality: N/A;  . right power port placement Right     FAMILY HISTORY Family History  Problem Relation Age of Onset  . Diabetes Mother   . Bipolar disorder Mother   . CAD Father   The patient's father still living, age 27, in [place]. He had prostate cancer at some point in the past. The patient's mother died at age 43 from complications of diabetes. The patient had no brothers, 2 sisters. A paternal grandmother had lung cancer in the setting of tobacco abuse.  There is no other history of cancer in the family to her knowledge  GYNECOLOGIC HISTORY:  No LMP recorded. Patient has had an ablation. Menarche approximately age 31. First live birth in 40. The patient is GX P2. She underwent endometrial ablation in 2016.  SOCIAL HISTORY:  The patient is originally from [place]. She has lived in [place] before but more recently was in [place]. She is back here because she could not afford her rent in [place]. She is living here and a temporary situation. She is divorced. HER-2 children are [name] who lives in [place] and works as a Development worker, community, and [name] who also lives in [place] and works as a Catering manager. The patient has a grandchild, Arelia Longest, 22 years old as of July 2017, living in [place] with his mother. The patient has not established herself with a local church yet.    ADVANCED DIRECTIVES: Not in place; at the 06/03/2016 visit the patient was given the appropriate forms to complete and notarize at her discretion   HEALTH MAINTENANCE: Social History  Substance Use Topics  . Smoking status: Current Every Day Smoker    Packs/day: 0.50    Types: Cigarettes  . Smokeless tobacco: Never Used     Comment: Pt is on Chantix at present  time  . Alcohol use Yes     Comment: no ETOH since 08/22/12     Colonoscopy:  PAP:  Bone density:   Allergies  Allergen Reactions  . Demerol Itching and Nausea And Vomiting  . Erythromycin Rash    Can take zpak    Current Outpatient Prescriptions  Medication Sig Dispense Refill  . cetirizine (ZYRTEC) 10 MG tablet Take 1 tablet (10 mg total) by mouth daily. 30 tablet 11  . dexamethasone (DECADRON) 4 MG tablet Take 1 tablet (4 mg total) by mouth 2 (two) times daily. 60 tablet 0  . diphenoxylate-atropine (LOMOTIL) 2.5-0.025 MG tablet Take 2 tablets by mouth 4 (four) times daily as needed for diarrhea or loose stools. 40 tablet 0  . fluticasone (FLONASE) 50 MCG/ACT nasal spray Place 1 spray into  the nose daily.    [name] gabapentin (NEURONTIN) 300 MG capsule Take 2 capsules (600 mg total) by mouth 3 (three) times daily. 300 mg at night for one day, twice daily for one day, then three times daily 180 capsule 2  . hyaluronate sodium (RADIAPLEXRX) GEL Apply 1 application topically.    [name] HYDROcodone-acetaminophen (NORCO) 7.5-325 MG tablet Take 1-2 tablets by mouth every 6 (six) hours as needed for severe pain (give one tab, then if other tab is needed, may give 30 min later.). 60 tablet 0  . ibuprofen (ADVIL,MOTRIN) 200 MG tablet Take 800 mg by mouth every 6 (six) hours as needed for mild pain.    [name] lamoTRIgine (LAMICTAL) 100 MG tablet Take 1.5 tablets (150 mg total) by mouth 2 (two) times daily. 60 tablet 3  . lidocaine-prilocaine (EMLA) cream Apply 1 application topically as needed. 30 g 0  . lithium carbonate 300 MG capsule Take 1 capsule (300 mg total) by mouth 2 (two) times daily with a meal. 60 capsule 3  . Loperamide HCl (IMODIUM PO) Take 1 tablet by mouth daily as needed (diarhea).    . LORazepam (ATIVAN) 0.5 MG tablet Take 1 tablet (0.5 mg total) by mouth every 8 (eight) hours as needed for anxiety. 30 tablet 1  . ondansetron (ZOFRAN) 8 MG tablet Take 1 tablet (8 mg total) by mouth every 8 (eight) hours as needed for nausea or vomiting. 30 tablet 1  . pantoprazole (PROTONIX) 40 MG tablet Take 1 tablet (40 mg total) by mouth daily. 30 tablet 3  . polyethylene glycol powder (GLYCOLAX/MIRALAX) powder Take 17 g by mouth 2 (two) times daily as needed for mild constipation. (Patient not taking: Reported on 08/13/2016) 3350 g 3  . potassium chloride (K-DUR) 10 MEQ tablet Take 1 tablet (10 mEq total) by mouth daily. 30 tablet 3  . prochlorperazine (COMPAZINE) 10 MG tablet Take 10 mg by mouth daily as needed for nausea.     [name] tiZANidine (ZANAFLEX) 4 MG tablet Take 1 tablet (4 mg total) by mouth daily. 30 tablet 0  . traMADol (ULTRAM) 50 MG tablet Take 1 tablet (50 mg total) by mouth every 6 (six)  hours as needed. 60 tablet 0  . trazodone (DESYREL) 300 MG tablet Take 1 tablet (300 mg total) by mouth at bedtime. 30 tablet 3  . triamcinolone cream (KENALOG) 0.5 % Apply 1 application topically daily as needed.  1  . trolamine salicylate (ASPERCREME) 10 % cream Apply 1 application topically as needed for muscle pain. Reported on 06/01/2016    . varenicline (CHANTIX) 0.5 MG tablet Take 1 tablet (0.5 mg total) by mouth daily. 30 tablet 3  .  Vitamin D, Cholecalciferol, 1000 units CAPS Take 1,000 Units by mouth daily.     No current facility-administered medications for this visit.     OBJECTIVE:   Vitals:   08/31/17 0945  BP: 107/60  Pulse: 70  Resp: 17  Temp: 98.2 F (36.8 C)  TempSrc: Oral  SpO2: 100%  Weight: 126 lb 11.2 oz (57.5 kg)  Height: 5' 5"  (1.651 m)  Body mass index is 21.08 kg/m. GENERAL: Patient is a well appearing female in no acute distress HEENT:  Sclerae anicteric. PERRL. Oropharynx clear and moist. No ulcerations or evidence of oropharyngeal candidiasis. Neck is supple.  NODES:  No cervical, supraclavicular, or axillary lymphadenopathy palpated.  BREAST EXAM: deferred LUNGS:  Clear to auscultation bilaterally.  No wheezes or rhonchi. HEART:  Regular rate and rhythm. No murmur appreciated. ABDOMEN:  Soft, nontender.  Positive, normoactive bowel sounds. No organomegaly palpated. MSK:  No focal spinal tenderness to palpation. Full range of motion bilaterally in the upper extremities. EXTREMITIES:  No peripheral edema.   SKIN:  Clear with no obvious rashes or skin changes. No nail dyscrasia. NEURO:  Nonfocal. Well oriented.  Appropriate affect.     LAB RESULTS:  CMP     Component Value Date/Time   NA 140 08/13/2016 0926   NA 139 07/28/2016 1118   K 4.3 08/13/2016 0926   K 4.1 07/28/2016 1118   CL 107 08/13/2016 0926   CO2 25 08/13/2016 0926   CO2 26 07/28/2016 1118   GLUCOSE 96 08/13/2016 0926   GLUCOSE 91 07/28/2016 1118   BUN 8 08/13/2016 0926    BUN 8.6 07/28/2016 1118   CREATININE 0.94 08/13/2016 0926   CREATININE 0.8 07/28/2016 1118   CALCIUM 9.2 08/13/2016 0926   CALCIUM 9.7 07/28/2016 1118   PROT 6.7 07/28/2016 1118   ALBUMIN 4.0 07/28/2016 1118   AST 18 07/28/2016 1118   ALT 13 07/28/2016 1118   ALKPHOS 50 07/28/2016 1118   BILITOT <0.30 07/28/2016 1118   GFRNONAA >60 08/13/2016 0926   GFRAA >60 08/13/2016 0926    INo results found for: SPEP, UPEP  Lab Results  Component Value Date   WBC 5.0 08/13/2016   NEUTROABS 4.2 07/28/2016   HGB 12.9 08/13/2016   HCT 40.8 08/13/2016   MCV 96.9 08/13/2016   PLT 171 08/13/2016      Chemistry      Component Value Date/Time   NA 140 08/13/2016 0926   NA 139 07/28/2016 1118   K 4.3 08/13/2016 0926   K 4.1 07/28/2016 1118   CL 107 08/13/2016 0926   CO2 25 08/13/2016 0926   CO2 26 07/28/2016 1118   BUN 8 08/13/2016 0926   BUN 8.6 07/28/2016 1118   CREATININE 0.94 08/13/2016 0926   CREATININE 0.8 07/28/2016 1118      Component Value Date/Time   CALCIUM 9.2 08/13/2016 0926   CALCIUM 9.7 07/28/2016 1118   ALKPHOS 50 07/28/2016 1118   AST 18 07/28/2016 1118   ALT 13 07/28/2016 1118   BILITOT <0.30 07/28/2016 1118       No results found for: LABCA2  No components found for: LABCA125  No results for input(s): INR in the last 168 hours.  Urinalysis    Component Value Date/Time   COLORURINE YELLOW 04/18/2013 1222   APPEARANCEUR CLEAR 04/18/2013 1222   LABSPEC 1.011 04/18/2013 1222   PHURINE 5.5 04/18/2013 1222   GLUCOSEU NEGATIVE 04/18/2013 1222   HGBUR TRACE (A) 04/18/2013 1222   BILIRUBINUR negative 03/28/2015  [place] 04/18/2013 1222   PROTEINUR negative 03/28/2015 1321   PROTEINUR NEGATIVE 04/18/2013 1222   UROBILINOGEN 0.2 03/28/2015 1321   UROBILINOGEN 0.2 04/18/2013 1222   NITRITE negative 03/28/2015 1321   NITRITE NEGATIVE 04/18/2013 1222   LEUKOCYTESUR Trace 03/28/2015 1321     STUDIES: No results found.   ELIGIBLE FOR  AVAILABLE RESEARCH PROTOCOL: no  ASSESSMENT: 56 y.o. 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  (2) status post left mastectomy and axillary lymph node dissection  with immediate expander placement 07/18/2015 for an mpT4 pN2,stage IIIB invasive ductal carcinoma, grade 3, with negative margins.  (a) definitive implant exchange to be scheduled in December   METASTATIC DISEASE: October 2016  (3) CT scan of the chest abdomen and pelvis  09/16/2015 shows metastatic lesions in the right scapula, left iliac crest, L4, and T spine. There were questionable liver cysts, with repeat CT scan 03/02/2016 showing possible right upper lobe lung lesions and possibly increased liver lesions  (a) CT scan of the chest 06/17/2016 shows no active disease in the lungs or liver  (b) Bone scan July 2017 showed no evidence of bony metastatic disease   (c) head CT 07/08/2016 showed a cerebellar lesion, confirmed by MRI 07/23/2016, status post craniotomy 08/14/2016, confirming a metastatic deposit which was estrogen and progesterone receptor negative, HER-2 amplified with a signals ratio of 7.16, number per cell 13.25  (d) CA 27-29 not informative  (4) received docetaxel every 3 weeks 6 together with trastuzumab and pertuzumab, last docetaxel dose 02/11/2016  (5) adjuvant radiation7/03/2016 to 06/26/2016 at [place]: 1. The Left chest wall was treated to 23.4 Gy in 13 fractions at 1.8 Gy per fraction. 2. The Left chest wall was boosted to 10 Gy in 5 fractions at 2 Gy per fraction. 3. The Left Sclav/PAB was treated to 23.4 Gy in 13 fractions at 1.8 Gy per fraction.  Including the patient's treatment in [place] (received 15 fractions per Dr. Maryan Rued near [place], [place]), the patient received 50.4 Gy to the left chest wall and  supraclavicular region.   (6) started trastuzumab and pertuzumab October 2016, continuing every 3 weeks,  (a) echocardiogram 02/26/2016 showed a well preserved ejection fraction  (b) echocardiogram 07/01/2016 shows an ejection fraction in the 60-65%   (c) pertuzumab discontinued 10/2016 with uncontrolled diarrhea  (d) echocardiogram 11/11/2016 showed an ejection fraction in the 60-65%  (e) echocardiogram 03/03/2017 shows an ejection fraction of 60-65%  (f) echocardiogram on 05/19/2017 shows an ejection fraction of 55-60%    (7) started denosumab/Xgeva October 2017, repeated every 4 weeks  (8) started anastrozole October 2017   (a) bone scan 11/10/2016 shows no active disease  (b) chest CT scan 11/10/2016 stable, with no evidence of active disease  (c) chest CT and bone scan 07/02/2017 show no evidence of active disease  (9) history of bipolar disorder  (a) currently on Lamictal and Latuda as well as Desiree L and Neurontin  (10) mild anemia with a significant drop in the MCV, ferritin 10 06/03/2016,   (a) Feraheme given 06/12/2016 and 06/18/2016  (11) tobacco abuse: Chantix started 06/18/2016--she is not currently trying to quit   (12) 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  (a) brain irradiation10/19/17-11/08/17: Whole brain/ 35 Gy in 14 fractions   (b) repeat  brain MRI obtained 12/10/2016 shows no active disease in the brain  (c) repeat brain MRI 03/02/2017 shows no evidence of residual or recurrent disease  (d) repeat brain MRI 07/29/2017 shows no evidence of residual or recurrent disease   PLAN: Latha is doing well today.  She will proceed with treatment with Trastuzumab and Xgeva as long as her labs are ok (currently pending).  She is doing well.  She will continue taking Anastrozole daily.  She wants to receive her flu shot today, and this is ok.  I talked to her nurse Amy and she will give it.  She will have echo later this month  (10/26).  She will see Dr. [name] on 10/30 for labs, eval, and Trastuzumab.  The working plan is to restage the patient in November/December.    She knows to call for any other problems that may develop before the next visit.  A total of (20) minutes of face-to-face time was spent with this patient with greater than 50% of that time in counseling and care-coordination.   Scot Dock, NP Medical Oncology and Hematology [facility] [address] [place], [place] [address] Tel. [phone]    Fax. [phone]

## 2017-08-31 NOTE — Progress Notes (Signed)
Okay to proceed with treatment with Echo from 05/19/17 per Val RN per Dr. [name]. Per Mendel Ryder NP pt to receive Xgeva today.

## 2017-08-31 NOTE — Patient Instructions (Signed)
[facility] Discharge Instructions for Patients Receiving Chemotherapy  Today you received the following chemotherapy agents Herceptin   To help prevent nausea and vomiting after your treatment, we encourage you to take your nausea medication as directed.    If you develop nausea and vomiting that is not controlled by your nausea medication, call the clinic.   BELOW ARE SYMPTOMS THAT SHOULD BE REPORTED IMMEDIATELY:  *FEVER GREATER THAN 100.5 F  *CHILLS WITH OR WITHOUT FEVER  NAUSEA AND VOMITING THAT IS NOT CONTROLLED WITH YOUR NAUSEA MEDICATION  *UNUSUAL SHORTNESS OF BREATH  *UNUSUAL BRUISING OR BLEEDING  TENDERNESS IN MOUTH AND THROAT WITH OR WITHOUT PRESENCE OF ULCERS  *URINARY PROBLEMS  *BOWEL PROBLEMS  UNUSUAL RASH Items with * indicate a potential emergency and should be followed up as soon as possible.  Feel free to call the clinic should you have any questions or concerns. The clinic phone number is [phone].  Please show the CHEMO ALERT CARD at check-in to the Emergency Department and triage nurse.  Denosumab injection What is this medicine? DENOSUMAB (den oh sue mab) slows bone breakdown. Prolia is used to treat osteoporosis in women after menopause and in men. Xgeva is used to treat a high calcium level due to cancer and to prevent bone fractures and other bone problems caused by multiple myeloma or cancer bone metastases. Xgeva is also used to treat giant cell tumor of the bone. This medicine may be used for other purposes; ask your health care provider or pharmacist if you have questions. COMMON BRAND NAME(S): Prolia, XGEVA What should I tell my health care provider before I take this medicine? They need to know if you have any of these conditions: -dental disease -having surgery or tooth extraction -infection -kidney disease -low levels of calcium or Vitamin D in the blood -malnutrition -on hemodialysis -skin conditions or  sensitivity -thyroid or parathyroid disease -an unusual reaction to denosumab, other medicines, foods, dyes, or preservatives -pregnant or trying to get pregnant -breast-feeding How should I use this medicine? This medicine is for injection under the skin. It is given by a health care professional in a hospital or clinic setting. If you are getting Prolia, a special MedGuide will be given to you by the pharmacist with each prescription and refill. Be sure to read this information carefully each time. For Prolia, talk to your pediatrician regarding the use of this medicine in children. Special care may be needed. For Xgeva, talk to your pediatrician regarding the use of this medicine in children. While this drug may be prescribed for children as young as 13 years for selected conditions, precautions do apply. Overdosage: If you think you have taken too much of this medicine contact a poison control center or emergency room at once. NOTE: This medicine is only for you. Do not share this medicine with others. What if I miss a dose? It is important not to miss your dose. Call your doctor or health care professional if you are unable to keep an appointment. What may interact with this medicine? Do not take this medicine with any of the following medications: -other medicines containing denosumab This medicine may also interact with the following medications: -medicines that lower your chance of fighting infection -steroid medicines like prednisone or cortisone This list may not describe all possible interactions. Give your health care provider a list of all the medicines, herbs, non-prescription drugs, or dietary supplements you use. Also tell them if you smoke, drink alcohol, or   use illegal drugs. Some items may interact with your medicine. What should I watch for while using this medicine? Visit your doctor or health care professional for regular checks on your progress. Your doctor or health care  professional may order blood tests and other tests to see how you are doing. Call your doctor or health care professional for advice if you get a fever, chills or sore throat, or other symptoms of a cold or flu. Do not treat yourself. This drug may decrease your body's ability to fight infection. Try to avoid being around people who are sick. You should make sure you get enough calcium and vitamin D while you are taking this medicine, unless your doctor tells you not to. Discuss the foods you eat and the vitamins you take with your health care professional. See your dentist regularly. Brush and floss your teeth as directed. Before you have any dental work done, tell your dentist you are receiving this medicine. Do not become pregnant while taking this medicine or for 5 months after stopping it. Talk with your doctor or health care professional about your birth control options while taking this medicine. Women should inform their doctor if they wish to become pregnant or think they might be pregnant. There is a potential for serious side effects to an unborn child. Talk to your health care professional or pharmacist for more information. What side effects may I notice from receiving this medicine? Side effects that you should report to your doctor or health care professional as soon as possible: -allergic reactions like skin rash, itching or hives, swelling of the face, lips, or tongue -bone pain -breathing problems -dizziness -jaw pain, especially after dental work -redness, blistering, peeling of the skin -signs and symptoms of infection like fever or chills; cough; sore throat; pain or trouble passing urine -signs of low calcium like fast heartbeat, muscle cramps or muscle pain; pain, tingling, numbness in the hands or feet; seizures -unusual bleeding or bruising -unusually weak or tired Side effects that usually do not require medical attention (report to your doctor or health care professional  if they continue or are bothersome): -constipation -diarrhea -headache -joint pain -loss of appetite -muscle pain -runny nose -tiredness -upset stomach This list may not describe all possible side effects. Call your doctor for medical advice about side effects. You may report side effects to FDA at 1-800-FDA-1088. Where should I keep my medicine? This medicine is only given in a clinic, doctor's office, or other health care setting and will not be stored at home. NOTE: This sheet is a summary. It may not cover all possible information. If you have questions about this medicine, talk to your doctor, pharmacist, or health care provider.  2018 Elsevier/Gold Standard (2016-12-08 19:17:21)  

## 2017-09-01 LAB — CANCER ANTIGEN 27.29: CA 27.29: 3.5 U/mL (ref 0.0–38.6)

## 2017-09-08 ENCOUNTER — Telehealth: Payer: Self-pay

## 2017-09-08 MED ORDER — LORATADINE-PSEUDOEPHEDRINE ER 5-120 MG PO TB12: 1.0000 | ORAL_TABLET | Freq: Two times a day (BID) | ORAL | 1 refills | Status: DC | PRN

## 2017-09-08 NOTE — Telephone Encounter (Signed)
Pt called she is having congestion in both ears left> right. Popping noises, full feeling. No fever, no drainage, no sore throat. About 1 week.  Is using claritin and flonase. Has f/u with PCP Friday but is asking if can use claritin D. rx sent for insurance purposes. Instructed on salt water gargles as well.

## 2017-09-10 ENCOUNTER — Ambulatory Visit: Payer: Medicaid Other | Attending: Family Medicine | Admitting: Family Medicine

## 2017-09-10 ENCOUNTER — Telehealth: Payer: Self-pay

## 2017-09-10 ENCOUNTER — Encounter: Payer: Self-pay | Admitting: Family Medicine

## 2017-09-10 VITALS — BP 103/73 | HR 80 | Temp 97.9°F | Resp 16 | Wt 125.6 lb

## 2017-09-10 DIAGNOSIS — R51 Headache: Secondary | ICD-10-CM | POA: Diagnosis not present

## 2017-09-10 DIAGNOSIS — Z79899 Other long term (current) drug therapy: Secondary | ICD-10-CM | POA: Diagnosis not present

## 2017-09-10 DIAGNOSIS — R519 Headache, unspecified: Secondary | ICD-10-CM

## 2017-09-10 DIAGNOSIS — Z923 Personal history of irradiation: Secondary | ICD-10-CM | POA: Insufficient documentation

## 2017-09-10 DIAGNOSIS — F3130 Bipolar disorder, current episode depressed, mild or moderate severity, unspecified: Secondary | ICD-10-CM | POA: Diagnosis not present

## 2017-09-10 DIAGNOSIS — F4310 Post-traumatic stress disorder, unspecified: Secondary | ICD-10-CM | POA: Insufficient documentation

## 2017-09-10 DIAGNOSIS — Z9012 Acquired absence of left breast and nipple: Secondary | ICD-10-CM | POA: Insufficient documentation

## 2017-09-10 DIAGNOSIS — Z7982 Long term (current) use of aspirin: Secondary | ICD-10-CM | POA: Diagnosis not present

## 2017-09-10 DIAGNOSIS — C50919 Malignant neoplasm of unspecified site of unspecified female breast: Secondary | ICD-10-CM | POA: Insufficient documentation

## 2017-09-10 DIAGNOSIS — Z85841 Personal history of malignant neoplasm of brain: Secondary | ICD-10-CM | POA: Insufficient documentation

## 2017-09-10 DIAGNOSIS — Z1211 Encounter for screening for malignant neoplasm of colon: Secondary | ICD-10-CM | POA: Diagnosis not present

## 2017-09-10 DIAGNOSIS — G8929 Other chronic pain: Secondary | ICD-10-CM

## 2017-09-10 DIAGNOSIS — C7951 Secondary malignant neoplasm of bone: Secondary | ICD-10-CM | POA: Diagnosis not present

## 2017-09-10 MED ORDER — TRAMADOL HCL 50 MG PO TABS: 50.0000 mg | ORAL_TABLET | Freq: Two times a day (BID) | ORAL | 0 refills | Status: DC | PRN

## 2017-09-10 NOTE — Telephone Encounter (Signed)
Pt has had mammogram in [place], never in [place]. [facility]. She needs orders and appt please. She has CT chest due in November.  Discussed using her PCP for pap and she will call them Monday morning.

## 2017-09-10 NOTE — Progress Notes (Signed)
Subjective:  Patient ID: [name], Shannon    DOB: October 23, 1961  Age: 56 y.o. 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  MRI brain from 06/2017 revealed satisfactory posttreatment appearance of the brain with no metastatic disease identified, increase cerebral white matter T2 and FLAIR hyperintensity as the sequelae of whole brain radiation. Whole-body bone scan from 06/2017 revealed no evidence of metastatic disease. Most recent visit to oncology was on 08/31/17.  Her bipolar disorder is stable and she is doing well on her current regimen and is happy with her psychiatric care. She denies depression, anxiety, suicidal ideations or intents.  Her headaches were uncontrolled on Fioricet which led to her discontinuing it. She did well while on tramadol in the past and is requesting going back on tramadol. She denies nausea, vomiting, headaches, change in appetite and feels well overall. She has an upcoming appointment with her neuro- oncologist at the end of this month.  Past Medical History:  Diagnosis Date  . Alcohol abuse   . Anemia    during chemo  . Anxiety    At age 60  . Arthritis Dx 2010  . Bipolar disorder ([place])   . Cancer ([place])    breast mets to brain  . Chronic pain   . Complication of anesthesia   . Depression   . Fibromyalgia Dx 2005  . GERD (gastroesophageal reflux disease)   . Headache    hx  migraines  . Opiate dependence ([place])   . PONV (postoperative nausea and vomiting)   . Port-A-Cath in place   . PTSD (post-traumatic stress disorder)     Past  Surgical History:  Procedure Laterality Date  . APPLICATION OF CRANIAL NAVIGATION N/A 08/14/2016   Procedure: APPLICATION OF CRANIAL NAVIGATION;  Surgeon: Erline Levine, MD;  Location: [place];  Service: Neurosurgery;  Laterality: N/A;  . BREAST RECONSTRUCTION Left    with silicone implant  . CRANIOTOMY N/A 08/14/2016   Procedure: CRANIOTOMY TUMOR EXCISION WITH Lucky Rathke;  Surgeon: Erline Levine, MD;  Location: [facility];  Service: Neurosurgery;  Laterality: N/A;  . FIBULA FRACTURE SURGERY Left   . MASTECTOMY Left   . RADIOLOGY WITH ANESTHESIA N/A 07/23/2016   Procedure: MRI OF BRAIN WITH AND WITHOUT;  Surgeon: Medication Radiologist, MD;  Location: [facility];  Service: Radiology;  Laterality: N/A;  . RADIOLOGY WITH ANESTHESIA N/A 09/08/2016   Procedure: MRI OF BRAIN WITH AND WITHOUT CONTRAST;  Surgeon: Medication Radiologist, MD;  Location: [place];  Service: Radiology;  Laterality: N/A;  . RADIOLOGY WITH ANESTHESIA N/A 12/10/2016   Procedure: MRI OF BRAIN WITH AND WITHOUT;  Surgeon: Medication Radiologist, MD;  Location: [facility];  Service: Radiology;  Laterality: N/A;  . RADIOLOGY WITH ANESTHESIA N/A 03/02/2017   Procedure: MRI of BRAIN W and W/OUT CONTRAST;  Surgeon: Medication Radiologist, MD;  Location: [facility];  Service: Radiology;  Laterality: N/A;  . RADIOLOGY WITH ANESTHESIA N/A 07/29/2017   Procedure: RADIOLOGY WITH ANESTHESIA MRI OF BRAIN WITH AND WITHOUT CONTRAST;  Surgeon: Radiologist, Medication, MD;  Location: [facility];  Service: Radiology;  Laterality: N/A;  . right power port placement  Right     Allergies  Allergen Reactions  . Demerol Itching and Nausea And Vomiting  . Erythromycin Rash     Outpatient Medications Prior to Visit  Medication Sig Dispense Refill  . anastrozole (ARIMIDEX) 1 MG tablet Take 1 tablet (1 mg total) by mouth daily. 90 tablet 1  . Aspirin-Acetaminophen-Caffeine (GOODY HEADACHE PO) Take 1 packet by mouth 3 (three) times daily as needed (headaches).    [name]  BIOTIN PO Take 1 tablet by mouth daily.    . Cholecalciferol (VITAMIN D3) 5000 units CAPS Take 5,000 Units by mouth daily.    . cycloSPORINE (RESTASIS) 0.05 % ophthalmic emulsion Place 1 drop into both eyes 2 (two) times daily.    [name] dextromethorphan-guaiFENesin (MUCINEX DM) 30-600 MG 12hr tablet Take 1 tablet by mouth 2 (two) times daily. 30 tablet 1  . docusate sodium (COLACE) 100 MG capsule Take 100 mg by mouth daily.    . fluticasone (FLONASE) 50 MCG/ACT nasal spray Place 2 sprays into both nostrils daily. 16 g 0  . gabapentin (NEURONTIN) 300 MG capsule Take 1 capsule (300 mg total) by mouth 2 (two) times daily. 60 capsule 3  . guaiFENesin (MUCINEX) 600 MG 12 hr tablet Take 600 mg by mouth 2 (two) times daily. May take an additional 600 mg twice daily as needed for congestion    . ibuprofen (ADVIL,MOTRIN) 800 MG tablet Take 1 tablet (800 mg total) by mouth 3 (three) times daily. 90 tablet 0  . lamoTRIgine (LAMICTAL) 25 MG tablet Take 1 tablet (25 mg total) by mouth 2 (two) times daily. (Patient taking differently: Take 50 mg by mouth daily. ) 60 tablet 0  . loratadine (CLARITIN) 10 MG tablet Take 1 tablet (10 mg total) by mouth daily. 30 tablet 11  . loratadine-pseudoephedrine (CLARITIN-D 12 HOUR) 5-120 MG tablet Take 1 tablet by mouth 2 (two) times daily as needed for allergies. 20 tablet 1  . lurasidone (LATUDA) 40 MG TABS tablet Take 1 tablet (40 mg total) by mouth daily with breakfast. 30 tablet 0  . ondansetron (ZOFRAN) 8 MG tablet Take 1 tablet (8 mg total) by mouth every 8 (eight) hours as needed for nausea or vomiting. (Patient taking differently: Take 8 mg by mouth 2 (two) times daily. ) 90 tablet 1  . pantoprazole (PROTONIX) 40 MG tablet Take 1 tablet (40 mg total) by mouth daily. 30 tablet 3  . polyethylene glycol (MIRALAX / GLYCOLAX) packet Take 17 g by mouth daily as needed for mild constipation. 30 each 1  . traZODone (DESYREL) 100 MG tablet Take 1 tablet (100 mg total) by mouth at  bedtime. (Patient taking differently: Take 200 mg by mouth at bedtime. ) 30 tablet 0  . butalbital-acetaminophen-caffeine (FIORICET WITH CODEINE) 50-325-40-30 MG capsule TAKE 1 CAPSULE BY MOUTH EVERY 12 HOURS AS NEEDED FOR HEADACHE (Patient not taking: Reported on 08/09/2017) 40 capsule 1   Facility-Administered Medications Prior to Visit  Medication Dose Route Frequency Provider Last Rate Last Dose  . betamethasone acetate-betamethasone sodium phosphate (CELESTONE) injection 3 mg  3 mg Intramuscular Once Daylene Katayama M, DPM        ROS Review of Systems  Constitutional: Negative for activity change, appetite change and fatigue.  HENT: Negative for congestion, sinus pressure and sore throat.   Eyes: Negative for visual disturbance.  Respiratory: Negative for cough, chest tightness, shortness of breath and wheezing.   Cardiovascular: Negative for chest pain and palpitations.  Gastrointestinal: Negative for abdominal distention, abdominal pain and  constipation.  Endocrine: Negative for polydipsia.  Genitourinary: Negative for dysuria and frequency.  Musculoskeletal: Negative for arthralgias and back pain.  Skin: Negative for rash.  Neurological: Negative for tremors, light-headedness and numbness.  Hematological: Does not bruise/bleed easily.  Psychiatric/Behavioral: Negative for agitation and behavioral problems.    Objective:  BP 103/73 (BP Location: Right Arm, Patient Position: Sitting, Cuff Size: Normal)   Pulse 80   Temp 97.9 F (36.6 C) (Oral)   Resp 16   Wt 125 lb 9.6 oz (57 kg)   SpO2 99%   BMI 20.90 kg/m   BP/Weight 09/10/2017 08/31/2017 2/95/6213  Systolic BP 086 578 469  Diastolic BP 73 60 80  Wt. (Lbs) 125.6 126.7 124.2  BMI 20.9 21.08 20.67  Some encounter information is confidential and restricted. Go to Review Flowsheets activity to see all data.      Physical Exam  Constitutional: She is oriented to person, place, and time. She appears well-developed and  well-nourished.  Cardiovascular: Normal rate, normal heart sounds and intact distal pulses.   No murmur heard. Pulmonary/Chest: Effort normal and breath sounds normal. She has no wheezes. She has no rales. She exhibits no tenderness.  Right upper chest wall port  Abdominal: Soft. Bowel sounds are normal. She exhibits no distension and no mass. There is no tenderness.  Musculoskeletal: Normal range of motion.  Neurological: She is alert and oriented to person, place, and time.  Skin: Skin is warm and dry.  Psychiatric: She has a normal mood and affect.      EXAM: MRI HEAD WITHOUT AND WITH CONTRAST  TECHNIQUE: Multiplanar, multiecho pulse sequences of the brain and surrounding structures were obtained without and with intravenous contrast.  CONTRAST:  57mL MULTIHANCE GADOBENATE DIMEGLUMINE 529 MG/ML IV SOLN  COMPARISON:  Brain MRI 03/29/2017 and earlier.  FINDINGS: Study performed under anesthesia.  Brain: Subtle decreased cerebral volume since January. Increasing bilateral patchy and indistinct cerebral white matter T2 and FLAIR hyperintensity without associated mass effect or enhancement.  Small resection site at the posterior left cerebellar hemisphere with resolved postoperative enhancement since January. Overlying craniotomy changes. No cerebellar edema. No dural thickening.  No abnormal enhancement identified.  No restricted diffusion to suggest acute infarction. No midline shift, mass effect, evidence of mass lesion, ventriculomegaly, extra-axial collection or acute intracranial hemorrhage. Cervicomedullary junction and pituitary are within normal limits. No chronic cerebral blood products.  Vascular: Major intracranial vascular flow voids are stable.  Skull and upper cervical spine: Stable and negative visualized cervical spine and spinal cord. Visualized bone marrow signal is within normal limits.  Sinuses/Orbits: Stable an normal orbits soft  tissues. Visualized paranasal sinuses and mastoids are stable and well pneumatized.  Other: Visible internal auditory structures appear normal. Mild left suboccipital postoperative changes. No acute scalp soft tissue findings.  IMPRESSION: 1. Satisfactory post treatment appearance of the brain with no metastatic disease identified. 2. Increased cerebral white matter T2 and FLAIR hyperintensity as the sequelae of whole brain radiation.   Electronically Signed   By: Genevie Ann M.D.   On: 07/29/2017 10:53  Assessment & Plan:   1. Metastatic breast cancer ([place]) Metastasis to the brain and bone  S/p radiation  most recent brain MRI reveals no metastatic disease identified, increased cerebral white matter changes in keeping with whole brain radiation Doing well on Arimidex, Trastuzumab, denosumab. She is due for a mammogram which will be ordered by her oncologist Keep appointment with neuro - oncology which comes up at end of this  month  2. Bipolar I disorder, most recent episode depressed ([place]) Stable Symptoms are controlled on trazodone, hydroxyzine, Lamictal, left foot Currently followed by psychiatry  3. Chronic nonintractable headache, unspecified headache type Noncontrolled on Fioricet Will give short course of tramadol as per patient request - I have discussed with her that due to tramadol having a potential to reduce seizure threshold I would recommend long-term use. - traMADol (ULTRAM) 50 MG tablet; Take 1 tablet (50 mg total) by mouth every 12 (twelve) hours as needed.  Dispense: 40 tablet; Refill: 0  4. Screening for colon cancer - Ambulatory referral to Gastroenterology   Meds ordered this encounter  Medications  . traMADol (ULTRAM) 50 MG tablet    Sig: Take 1 tablet (50 mg total) by mouth every 12 (twelve) hours as needed.    Dispense:  40 tablet    Refill:  0    Follow-up: Return in about 3 months (around 12/11/2017) for Follow-up of chronic medical  conditions.   Arnoldo Morale MD

## 2017-09-10 NOTE — Telephone Encounter (Signed)
Pt called for a referal for a mammogram and a pap smear.  Called back and lvm asking where she had her mammograms in the past. Also her PCP can do pap smear, if she has a PCP.  Asked her to call back.

## 2017-09-10 NOTE — Progress Notes (Signed)
Pt here for follow up hospital visit Discuss option of Tramadol for pain

## 2017-09-13 ENCOUNTER — Telehealth: Payer: Self-pay | Admitting: Internal Medicine

## 2017-09-13 NOTE — Telephone Encounter (Signed)
09/10/17 prescription refill scanned in media °

## 2017-09-14 ENCOUNTER — Other Ambulatory Visit: Payer: Self-pay | Admitting: Oncology

## 2017-09-14 DIAGNOSIS — C50312 Malignant neoplasm of lower-inner quadrant of left female breast: Secondary | ICD-10-CM

## 2017-09-14 MED ORDER — FLUTICASONE PROPIONATE 50 MCG/ACT NA SUSP
2.0000 | Freq: Every day | NASAL | 0 refills | Status: DC
Start: 2017-09-14 — End: 2017-12-10

## 2017-09-17 NOTE — Telephone Encounter (Signed)
No entry 

## 2017-09-17 NOTE — Telephone Encounter (Signed)
error 

## 2017-09-20 ENCOUNTER — Ambulatory Visit: Payer: Self-pay | Admitting: Internal Medicine

## 2017-09-21 NOTE — Progress Notes (Signed)
[name]  [phone] [phone]     ID: [name] DOB: 1961/04/28  MR#: [id number]  CLE#:[id number]  Patient Care Team: Arnoldo Morale, MD as PCP - General (Family Medicine) Kyung Rudd, MD as Consulting Physician (Radiation Oncology) Erline Levine, MD as Consulting Physician (Neurosurgery) Corena Pilgrim, MD as Consulting Physician (Psychiatry) Mickeal Skinner Acey Lav, MD as Consulting Physician (Psychiatry) Nehemiah Settle, MD as Referring Physician (Plastic Surgery)  CHIEF COMPLAINT: Metastatic triple positive breast cancer  CURRENT TREATMENT: Trastuzumab, denosumab, anastrozole   INTERVAL HISTORY: [name] returns today for follow-up and treatment of her triple positive breast cancer.  She continues on anastrozole, and is tolerating it relatively well. She reports lack of libido and vaginal dryness. She is interested in pelvic health classes. She does have intermittent hot flashes that she notes are tolerable.  She also receives trastuzumab every 28 days, with no reported issues.  Finally she receives denosumab/Xgeva every 28 days, and also notes no negative side affects.  Since her last visit here she had an echocardiogram September 24, 2017, which shows a well-preserved ejection fraction.    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 She hasn't been walking/exercising as much. She has been more fatigued lately, but notes she is unable to take naps. She is sleeping well during the night. Pt was able to see her son and grandson recently and states she hadn't seen them in about 6 months. She has plans to see more of her family in the near future. Pt had a "melt down" last week and got in with her therapist. Otherwise, pt is doing well. She denies unusual headaches, visual changes, nausea, vomiting, or dizziness. There has been no unusual cough, phlegm production, or pleurisy.  This been no change in bowel or bladder habits. She denies unexplained weight loss, bleeding, rash, or fever. A detailed review of systems was otherwise entirely stable.   BREAST CANCER HISTORY: From the original intake note:  [name] was aware of a "lemon sized lump in" her left axilla for about a year before bringing it to medical attention. By then she had developed left breast and left axillary swelling (June 2016). She presented to the local emergency room and had a chest CT scan 06/06/2015 which showed a nodule in the left breast measuring 0.9 cm and questionable left axillary adenopathy. She then proceeded to bilateral diagnostic mammography and left breast ultrasonography 06/19/2015. There were no prior films for comparison (last mammography 12 years prior).. The breast density was category C. Mammography showed in the left breast upper inner quadrant a 7 cm area including a small mass and significant pleomorphic calcifications. Ultrasonography defined the mass as measuring 1.2 cm. The left axilla appeared unremarkable. There was significant skin edema.  Biopsy of the left breast mass 06/19/2015 showed (SP [id number]) an invasive ductal carcinoma, grade 2, estrogen receptor 83% positive, progesterone receptor 26% positive, and HER-2 amplified by immunohistochemstry with a 3+ reading. The patient had biopsies of a separate area in the left breast August of the same year and this showed atypical ductal hyperplasia. (SP [id number]).  Accordingly after appropriate discussion on 08/21/2015 the patient proceeded to left mastectomy with left axillary sentinel lymph node sampling, which, since the lymph nodes were positive, extended to the procedure to left axillary lymph node dissection. The pathology (SP [id number]) showed an invasive ductal carcinoma, grade 3, measuring in excess of 9 cm. There were also  skin satellites, not contiguous with the invasive carcinoma. Margins were clear and ample. There was evidence  of lymphovascular invasion. A total of 15 lymph nodes were removed, including 5 sentinel lymph nodes, all of which were positive, so that the final total was 14 out of 15 lymph nodes involved by tumor. There was evidence of extranodal extension. The final pathology was pT4b pN2a, stage IIIB  CA-27-29 and CEA 09/19/2015 were non-informative October 2016.  Unfortunately CT scans of the chest abdomen and pelvis 09/16/2015 showed bony metastases to the right scapula, left iliac crest, and also L4 and T-spine. There were questionable liver cysts which on repeat CT scan 03/02/2016 appear to be a little bit more well-defined, possibly a little larger. There were also some possible right upper lobe lung lesions.  Adjuvant treatment consisted of docetaxel, trastuzumab and pertuzumab, with the final (6th) docetaxel dose given 02/11/2016. She continues on trastuzumab and pertuzumab, with the 11th cycle given 05/05/2016. Echocardiogram 02/26/2016 showed an ejection fraction of 55%. She receives denosumab/Xgeva every 4 weeks.. She also receives radiation, started 06/09/201, to be completed 06/26/2016.  Her subsequent history is as detailed below   PAST MEDICAL HISTORY: Past Medical History:  Diagnosis Date  . Alcohol abuse   . Anemia    during chemo  . Anxiety    At age 89  . Arthritis Dx 2010  . Bipolar disorder ([place])   . Cancer (HCC)    breast  . Chronic pain   . Depression   . Fibromyalgia Dx 2005  . GERD (gastroesophageal reflux disease)   . Headache    hx  migraines  . Opiate dependence ([place])   . Port-a-cath in place   . PTSD (post-traumatic stress disorder)     PAST SURGICAL HISTORY: Past Surgical History:  Procedure Laterality Date  . APPLICATION OF CRANIAL NAVIGATION N/A 08/14/2016   Procedure: APPLICATION OF CRANIAL NAVIGATION;  Surgeon: Erline Levine, MD;  Location: [facility];  Service: Neurosurgery;  Laterality: N/A;  . BREAST RECONSTRUCTION Left    with silicone implant  .  CRANIOTOMY N/A 08/14/2016   Procedure: CRANIOTOMY TUMOR EXCISION WITH Lucky Rathke;  Surgeon: Erline Levine, MD;  Location: [facility];  Service: Neurosurgery;  Laterality: N/A;  . FIBULA FRACTURE SURGERY    . MASTECTOMY Left   . RADIOLOGY WITH ANESTHESIA N/A 07/23/2016   Procedure: MRI OF BRAIN WITH AND WITHOUT;  Surgeon: Medication Radiologist, MD;  Location: [facility];  Service: Radiology;  Laterality: N/A;  . right power port placement Right     FAMILY HISTORY Family History  Problem Relation Age of Onset  . Diabetes Mother   . Bipolar disorder Mother   . CAD Father   The patient's father still living, age 67, in [place]. He had prostate cancer at some point in the past. The patient's mother died at age 30 from complications of diabetes. The patient had no brothers, 2 sisters. A paternal grandmother had lung cancer in the setting of tobacco abuse. There is no other history of cancer in the family to her knowledge  GYNECOLOGIC HISTORY:  No LMP recorded. Patient has had an ablation. Menarche approximately age 58. First live birth in 69. The patient is GX P2. She underwent endometrial ablation in 2016.  SOCIAL HISTORY:  The patient is originally from [place]. She has lived in [place] before but more recently was in [place]. She is back here because she could not afford her rent in [place]. She is living here and a temporary situation.  She is divorced. HER-2 children are [name] who lives in [place] and works as a Development worker, community, and [name] who also lives in [place] and works as a Catering manager. The patient has a grandchild, Arelia Longest, 40 years old as of July 2017, living in [place] with his mother. The patient has not established herself with a local church yet.    ADVANCED DIRECTIVES: Not in place; at the 06/03/2016 visit the patient was given the appropriate forms to complete and notarize at her discretion   HEALTH MAINTENANCE: Social History  Substance Use Topics  .  Smoking status: Current Every Day Smoker    Packs/day: 0.50    Types: Cigarettes  . Smokeless tobacco: Never Used     Comment: Pt is on Chantix at present time  . Alcohol use Yes     Comment: no ETOH since 08/22/12     Colonoscopy:  PAP:  Bone density:   Allergies  Allergen Reactions  . Demerol Itching and Nausea And Vomiting  . Erythromycin Rash    Can take zpak    Current Outpatient Prescriptions  Medication Sig Dispense Refill  . cetirizine (ZYRTEC) 10 MG tablet Take 1 tablet (10 mg total) by mouth daily. 30 tablet 11  . dexamethasone (DECADRON) 4 MG tablet Take 1 tablet (4 mg total) by mouth 2 (two) times daily. 60 tablet 0  . diphenoxylate-atropine (LOMOTIL) 2.5-0.025 MG tablet Take 2 tablets by mouth 4 (four) times daily as needed for diarrhea or loose stools. 40 tablet 0  . fluticasone (FLONASE) 50 MCG/ACT nasal spray Place 1 spray into the nose daily.    [name] gabapentin (NEURONTIN) 300 MG capsule Take 2 capsules (600 mg total) by mouth 3 (three) times daily. 300 mg at night for one day, twice daily for one day, then three times daily 180 capsule 2  . hyaluronate sodium (RADIAPLEXRX) GEL Apply 1 application topically.    [name] HYDROcodone-acetaminophen (NORCO) 7.5-325 MG tablet Take 1-2 tablets by mouth every 6 (six) hours as needed for severe pain (give one tab, then if other tab is needed, may give 30 min later.). 60 tablet 0  . ibuprofen (ADVIL,MOTRIN) 200 MG tablet Take 800 mg by mouth every 6 (six) hours as needed for mild pain.    [name] lamoTRIgine (LAMICTAL) 100 MG tablet Take 1.5 tablets (150 mg total) by mouth 2 (two) times daily. 60 tablet 3  . lidocaine-prilocaine (EMLA) cream Apply 1 application topically as needed. 30 g 0  . lithium carbonate 300 MG capsule Take 1 capsule (300 mg total) by mouth 2 (two) times daily with a meal. 60 capsule 3  . Loperamide HCl (IMODIUM PO) Take 1 tablet by mouth daily as needed (diarhea).    . LORazepam (ATIVAN) 0.5 MG tablet Take 1 tablet  (0.5 mg total) by mouth every 8 (eight) hours as needed for anxiety. 30 tablet 1  . ondansetron (ZOFRAN) 8 MG tablet Take 1 tablet (8 mg total) by mouth every 8 (eight) hours as needed for nausea or vomiting. 30 tablet 1  . pantoprazole (PROTONIX) 40 MG tablet Take 1 tablet (40 mg total) by mouth daily. 30 tablet 3  . polyethylene glycol powder (GLYCOLAX/MIRALAX) powder Take 17 g by mouth 2 (two) times daily as needed for mild constipation. (Patient not taking: Reported on 08/13/2016) 3350 g 3  . potassium chloride (K-DUR) 10 MEQ tablet Take 1 tablet (10 mEq total) by mouth daily. 30 tablet 3  . prochlorperazine (COMPAZINE) 10 MG tablet Take 10 mg by mouth  daily as needed for nausea.     [name] tiZANidine (ZANAFLEX) 4 MG tablet Take 1 tablet (4 mg total) by mouth daily. 30 tablet 0  . traMADol (ULTRAM) 50 MG tablet Take 1 tablet (50 mg total) by mouth every 6 (six) hours as needed. 60 tablet 0  . trazodone (DESYREL) 300 MG tablet Take 1 tablet (300 mg total) by mouth at bedtime. 30 tablet 3  . triamcinolone cream (KENALOG) 0.5 % Apply 1 application topically daily as needed.  1  . trolamine salicylate (ASPERCREME) 10 % cream Apply 1 application topically as needed for muscle pain. Reported on 06/01/2016    . varenicline (CHANTIX) 0.5 MG tablet Take 1 tablet (0.5 mg total) by mouth daily. 30 tablet 3  . Vitamin D, Cholecalciferol, 1000 units CAPS Take 1,000 Units by mouth daily.     No current facility-administered medications for this visit.     OBJECTIVE: Middle-aged white woman who appears well  Vitals:   09/28/17 0918  BP: 127/73  Pulse: 85  Resp: 17  Temp: 98.2 F (36.8 C)  TempSrc: Oral  SpO2: 100%  Weight: 132 lb (59.9 kg)  Height: 5' 5"  (1.651 m)  Body mass index is 21.97 kg/m.  ECOG: 1  Sclerae unicteric, pupils round and equal Oropharynx clear and moist No cervical or supraclavicular adenopathy Lungs no rales or rhonchi Heart regular rate and rhythm Abd soft, nontender,  positive bowel sounds MSK no focal spinal tenderness, no upper extremity lymphedema Neuro: nonfocal, well oriented, appropriate affect Breasts: The right breast is benign.  The left breast is status post mastectomy with expander in place; there is no evidence of local recurrence.  Both axillae are benign.    LAB RESULTS: CBC    Component Value Date/Time   WBC 4.5 08/31/2017 0900   WBC 4.9 07/29/2017 0655   RBC 3.79 08/31/2017 0900   RBC 4.06 07/29/2017 0655   HGB 12.5 08/31/2017 0900   HCT 36.9 08/31/2017 0900   PLT 133 large plts seen (L) 08/31/2017 0900   MCV 97.4 08/31/2017 0900   MCH 33.0 08/31/2017 0900   MCH 32.5 07/29/2017 0655   MCHC 33.9 08/31/2017 0900   MCHC 33.7 07/29/2017 0655   RDW 12.8 08/31/2017 0900   LYMPHSABS 0.9 08/31/2017 0900   MONOABS 0.4 08/31/2017 0900   EOSABS 0.1 08/31/2017 0900   BASOSABS 0.0 08/31/2017 0900   CMP Latest Ref Rng & Units 08/31/2017 08/03/2017 07/29/2017  Glucose 70 - 140 mg/dl 132 107 88  BUN 7.0 - 26.0 mg/dL 7.5 9.0 10  Creatinine 0.6 - 1.1 mg/dL 0.9 1.0 0.92  Sodium 136 - 145 mEq/L 139 140 142  Potassium 3.5 - 5.1 mEq/L 3.8 3.5 4.4  Chloride 101 - 111 mmol/L - - 109  CO2 22 - 29 mEq/L 28 25 23   Calcium 8.4 - 10.4 mg/dL 9.5 9.6 9.2  Total Protein 6.4 - 8.3 g/dL 6.1(L) 6.6 -  Total Bilirubin 0.20 - 1.20 mg/dL <0.22 <0.22 -  Alkaline Phos 40 - 150 U/L 42 49 -  AST 5 - 34 U/L 16 17 -  ALT 0 - 55 U/L 15 15 -   CA 27 is down to 3.5 as of 08-2017  STUDIES: Scheduled for brain MRI and chest CT December 2018  ELIGIBLE FOR AVAILABLE RESEARCH PROTOCOL: no  ASSESSMENT: 57 y.o. [place] woman with stage IV left-sided breast cancer involving bone and central nervous system  (1) s/p left breast lower inner quadrant biopsy 06/19/2015 for a clinical  T2-3 NX invasive ductal carcinoma, grade 2, triple positive.  (2) status post left mastectomy and axillary lymph node dissection  with immediate expander placement 07/18/2015 for an mpT4  pN2,stage IIIB invasive ductal carcinoma, grade 3, with negative margins.  (a) definitive implant exchange to be scheduled in December   METASTATIC DISEASE: October 2016  (3) CT scan of the chest abdomen and pelvis  09/16/2015 shows metastatic lesions in the right scapula, left iliac crest, L4, and T spine. There were questionable liver cysts, with repeat CT scan 03/02/2016 showing possible right upper lobe lung lesions and possibly increased liver lesions  (a) CT scan of the chest 06/17/2016 shows no active disease in the lungs or liver  (b) Bone scan July 2017 showed no evidence of bony metastatic disease   (c) head CT 07/08/2016 showed a cerebellar lesion, confirmed by MRI 07/23/2016, status post craniotomy 08/14/2016, confirming a metastatic deposit which was estrogen and progesterone receptor negative, HER-2 amplified with a signals ratio of 7.16, number per cell 13.25  (d) CA 27-29 not informative  (4) received docetaxel every 3 weeks 6 together with trastuzumab and pertuzumab, last docetaxel dose 02/11/2016  (5) adjuvant radiation7/03/2016 to 06/26/2016 at [facility] [place]: 1. The Left chest wall was treated to 23.4 Gy in 13 fractions at 1.8 Gy per fraction. 2. The Left chest wall was boosted to 10 Gy in 5 fractions at 2 Gy per fraction. 3. The Left Sclav/PAB was treated to 23.4 Gy in 13 fractions at 1.8 Gy per fraction.  Including the patient's treatment in [place] (received 15 fractions per Dr. Maryan Rued near [place], [place]), the patient received 50.4 Gy to the left chest wall and supraclavicular region.   (6) started trastuzumab and pertuzumab October 2016, continuing every 3 weeks,  (a) echocardiogram 02/26/2016 showed a well preserved ejection fraction  (b) echocardiogram 07/01/2016 shows an ejection fraction in the 60-65%   (c) pertuzumab discontinued 10/2016 with uncontrolled diarrhea  (d) echocardiogram 11/11/2016 showed an ejection fraction in the 60-65%  (e) echocardiogram  03/03/2017 shows an ejection fraction of 60-65%  (f) echocardiogram on 05/19/2017 shows an ejection fraction of 55-60%  (g) echocardiogram 09/24/2017 shows that ejection fraction in the 60-65% range.    (7) started denosumab/Xgeva October 2017, repeated every 4 weeks  (8) started anastrozole October 2017   (a) bone scan 11/10/2016 shows no active disease  (b) chest CT scan 11/10/2016 stable, with no evidence of active disease  (c) chest CT and bone scan 07/02/2017 show no evidence of active disease  (9) history of bipolar disorder  (a) currently on Lamictal and Latuda as well as Desiree L and Neurontin  (10) mild anemia with a significant drop in the MCV, ferritin 10 06/03/2016,   (a) Feraheme given 06/12/2016 and 06/18/2016  (11) tobacco abuse: Chantix started 06/18/2016--she is not currently trying to quit   (12) 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  (a) brain irradiation10/19/17-11/08/17: Whole brain/ 35 Gy in 14 fractions   (b) repeat brain MRI obtained 12/10/2016 shows no active disease in the brain  (c) repeat brain MRI 03/02/2017 shows no evidence of residual or recurrent disease  (d) repeat brain MRI 07/29/2017 shows no evidence of residual or recurrent disease   PLAN: Evie is now 2 years out from definitive diagnosis of metastatic breast cancer, with no evidence of active disease.  This is very favorable.  She is tolerating the trastuzumab/anastrozole/denosumab combination with no unusual side effects and the plan is to continue  that indefinitely until there is evidence of disease progression  She will have a restaging brain MRI in December.  I am also setting her up for a right mammogram and a CT of the chest that same month  She is planning to return to [place] to complete her plastic treatments.  She is also much more stable from a psychological point of view.  As far as her pelvic dryness is concerned I am referring her to  our "pelvic health" program here.  As far as the fatigue is concerned, I gave her information on the [facility] program, our tai chi and yoga class, [facility] and strongly encouraged her to participate  She will see me again in January.  She knows to call for any problems that may develop before that visit. Geremy Rister, Virgie Dad, MD  09/28/17 9:36 AM Medical Oncology and Hematology [facility] [address] [place], [place] [address] Tel. [phone]    Fax. [phone]  This document serves as a record of services personally performed by Chauncey Cruel, MD. It was created on his behalf by Margit Banda, a trained medical scribe. The creation of this record is based on the scribe's personal observations and the provider's statements to them. This document has been checked and approved by the attending provider.

## 2017-09-23 ENCOUNTER — Other Ambulatory Visit: Payer: Self-pay | Admitting: Internal Medicine

## 2017-09-23 ENCOUNTER — Telehealth: Payer: Self-pay | Admitting: *Deleted

## 2017-09-23 MED ORDER — METHYLPREDNISOLONE 4 MG PO TABS: ORAL_TABLET | ORAL | 0 refills | Status: DC

## 2017-09-23 NOTE — Telephone Encounter (Signed)
Patient called stating she "hurts so bad" in her head, neck, shoulders and back.  It is bringing her to tears and she has vomited once from the pain.  She states she has been taking Motrin 800 mg every 6-8 hours and Goody Powders approximately 5 a day with no relief.  I questioned if she had been using the tramadol that was prescribed by Dr. [name] and she stated that she took it for 1 week with no relief.    Reviewed complaints with Dr. Mickeal Skinner and he ordered Methylprednisone dose pak.  Notified patient that this was escripted to her pharmacy.  Notified of MRI with sedation and scheduled follow up following conference to see Dr. Mickeal Skinner.

## 2017-09-24 ENCOUNTER — Ambulatory Visit (HOSPITAL_COMMUNITY)
Admission: RE | Admit: 2017-09-24 | Discharge: 2017-09-24 | Disposition: A | Discharge status: Home / Self Care | Payer: Medicaid Other | Source: Ambulatory Visit | Attending: Internal Medicine | Admitting: Internal Medicine

## 2017-09-24 ENCOUNTER — Encounter (HOSPITAL_COMMUNITY): Payer: Self-pay

## 2017-09-24 DIAGNOSIS — C50312 Malignant neoplasm of lower-inner quadrant of left female breast: Secondary | ICD-10-CM

## 2017-09-24 NOTE — Progress Notes (Signed)
  Echocardiogram 2D Echocardiogram has been performed.  [name] 09/24/2017, 8:54 AM

## 2017-09-28 ENCOUNTER — Other Ambulatory Visit (HOSPITAL_BASED_OUTPATIENT_CLINIC_OR_DEPARTMENT_OTHER): Payer: Medicaid Other

## 2017-09-28 ENCOUNTER — Ambulatory Visit (HOSPITAL_BASED_OUTPATIENT_CLINIC_OR_DEPARTMENT_OTHER): Payer: Medicaid Other | Admitting: Oncology

## 2017-09-28 ENCOUNTER — Telehealth: Payer: Self-pay | Admitting: *Deleted

## 2017-09-28 ENCOUNTER — Ambulatory Visit (HOSPITAL_BASED_OUTPATIENT_CLINIC_OR_DEPARTMENT_OTHER): Payer: Medicaid Other

## 2017-09-28 VITALS — BP 127/73 | HR 85 | Temp 98.2°F | Resp 17 | Ht 65.0 in | Wt 132.0 lb

## 2017-09-28 DIAGNOSIS — C50312 Malignant neoplasm of lower-inner quadrant of left female breast: Secondary | ICD-10-CM

## 2017-09-28 DIAGNOSIS — C7931 Secondary malignant neoplasm of brain: Secondary | ICD-10-CM

## 2017-09-28 DIAGNOSIS — C7951 Secondary malignant neoplasm of bone: Secondary | ICD-10-CM

## 2017-09-28 DIAGNOSIS — R5383 Other fatigue: Secondary | ICD-10-CM | POA: Diagnosis not present

## 2017-09-28 DIAGNOSIS — C50919 Malignant neoplasm of unspecified site of unspecified female breast: Secondary | ICD-10-CM

## 2017-09-28 DIAGNOSIS — Z5112 Encounter for antineoplastic immunotherapy: Secondary | ICD-10-CM | POA: Diagnosis present

## 2017-09-28 DIAGNOSIS — N9489 Other specified conditions associated with female genital organs and menstrual cycle: Secondary | ICD-10-CM

## 2017-09-28 LAB — CBC WITH DIFFERENTIAL/PLATELET
BASO%: 0.4 % (ref 0.0–2.0)
Basophils Absolute: 0 10*3/uL (ref 0.0–0.1)
EOS%: 1.1 % (ref 0.0–7.0)
Eosinophils Absolute: 0.1 10*3/uL (ref 0.0–0.5)
HCT: 37.8 % (ref 34.8–46.6)
HGB: 12.6 g/dL (ref 11.6–15.9)
LYMPH%: 29.9 % (ref 14.0–49.7)
MCH: 32.5 pg (ref 25.1–34.0)
MCHC: 33.3 g/dL (ref 31.5–36.0)
MCV: 97.4 fL (ref 79.5–101.0)
MONO#: 0.5 10*3/uL (ref 0.1–0.9)
MONO%: 9.8 % (ref 0.0–14.0)
NEUT#: 3.1 10*3/uL (ref 1.5–6.5)
NEUT%: 58.8 % (ref 38.4–76.8)
Platelets: 168 10*3/uL (ref 145–400)
RBC: 3.88 10*6/uL (ref 3.70–5.45)
RDW: 12.8 % (ref 11.2–14.5)
WBC: 5.3 10*3/uL (ref 3.9–10.3)
lymph#: 1.6 10*3/uL (ref 0.9–3.3)

## 2017-09-28 LAB — COMPREHENSIVE METABOLIC PANEL
ALT: 18 U/L (ref 0–55)
AST: 16 U/L (ref 5–34)
Albumin: 3.7 g/dL (ref 3.5–5.0)
Alkaline Phosphatase: 36 U/L — ABNORMAL LOW (ref 40–150)
Anion Gap: 9 mEq/L (ref 3–11)
BUN: 7.7 mg/dL (ref 7.0–26.0)
CO2: 25 mEq/L (ref 22–29)
Calcium: 8.9 mg/dL (ref 8.4–10.4)
Chloride: 106 mEq/L (ref 98–109)
Creatinine: 0.9 mg/dL (ref 0.6–1.1)
EGFR: >60 mL/min/{1.73_m2} (ref >60)
Glucose: 116 mg/dl (ref 70–140)
Potassium: 3.7 mEq/L (ref 3.5–5.1)
Sodium: 140 mEq/L (ref 136–145)
Total Bilirubin: <0.22 mg/dL (ref 0.20–1.20)
Total Protein: 6 g/dL — ABNORMAL LOW (ref 6.4–8.3)

## 2017-09-28 MED ORDER — HEPARIN SOD (PORK) LOCK FLUSH 100 UNIT/ML IV SOLN
500.0000 [IU] | Freq: Once | INTRAVENOUS | Status: AC | PRN
  Administered 2017-09-28: 500 [IU]
  Filled 2017-09-28: qty 5

## 2017-09-28 MED ORDER — SODIUM CHLORIDE 0.9 % IV SOLN
450.0000 mg | Freq: Once | INTRAVENOUS | Status: AC
  Administered 2017-09-28: 450 mg via INTRAVENOUS
  Filled 2017-09-28: qty 21.43

## 2017-09-28 MED ORDER — SODIUM CHLORIDE 0.9 % IV SOLN
Freq: Once | INTRAVENOUS | Status: AC
  Administered 2017-09-28: 10:00:00 via INTRAVENOUS

## 2017-09-28 MED ORDER — ACETAMINOPHEN 325 MG PO TABS: 650.0000 mg | ORAL_TABLET | Freq: Once | ORAL | Status: DC

## 2017-09-28 MED ORDER — SODIUM CHLORIDE 0.9% FLUSH
10.0000 mL | INTRAVENOUS | Status: DC | PRN
  Administered 2017-09-28: 10 mL
  Filled 2017-09-28: qty 10

## 2017-09-28 MED ORDER — DENOSUMAB 120 MG/1.7ML ~~LOC~~ SOLN
120.0000 mg | Freq: Once | SUBCUTANEOUS | Status: AC
  Administered 2017-09-28: 120 mg via SUBCUTANEOUS
  Filled 2017-09-28: qty 1.7

## 2017-09-28 MED ORDER — DIPHENHYDRAMINE HCL 25 MG PO CAPS: 25.0000 mg | ORAL_CAPSULE | Freq: Once | ORAL | Status: DC

## 2017-09-28 NOTE — Patient Instructions (Signed)
[facility] Discharge Instructions for Patients Receiving Chemotherapy  Today you received the following chemotherapy agents Herceptin  To help prevent nausea and vomiting after your treatment, we encourage you to take your nausea medication as directed   If you develop nausea and vomiting that is not controlled by your nausea medication, call the clinic.   BELOW ARE SYMPTOMS THAT SHOULD BE REPORTED IMMEDIATELY:  *FEVER GREATER THAN 100.5 F  *CHILLS WITH OR WITHOUT FEVER  NAUSEA AND VOMITING THAT IS NOT CONTROLLED WITH YOUR NAUSEA MEDICATION  *UNUSUAL SHORTNESS OF BREATH  *UNUSUAL BRUISING OR BLEEDING  TENDERNESS IN MOUTH AND THROAT WITH OR WITHOUT PRESENCE OF ULCERS  *URINARY PROBLEMS  *BOWEL PROBLEMS  UNUSUAL RASH Items with * indicate a potential emergency and should be followed up as soon as possible.  Feel free to call the clinic should you have any questions or concerns. The clinic phone number is [phone].  Please show the CHEMO ALERT CARD at check-in to the Emergency Department and triage nurse.   

## 2017-09-28 NOTE — Telephone Encounter (Signed)
Draw labs with port access - per MD proceed with herceptin without today's labs.

## 2017-09-29 LAB — CANCER ANTIGEN 27.29: CA 27.29: 10.1 U/mL (ref 0.0–38.6)

## 2017-10-01 ENCOUNTER — Other Ambulatory Visit: Payer: Self-pay | Admitting: *Deleted

## 2017-10-01 ENCOUNTER — Telehealth: Payer: Self-pay | Admitting: Oncology

## 2017-10-01 ENCOUNTER — Ambulatory Visit: Payer: Medicaid Other | Attending: Oncology | Admitting: Physical Therapy

## 2017-10-01 MED ORDER — IBUPROFEN 800 MG PO TABS
800.0000 mg | ORAL_TABLET | Freq: Three times a day (TID) | ORAL | 0 refills | Status: DC
Start: 2017-10-01 — End: 2017-10-29

## 2017-10-01 NOTE — Telephone Encounter (Signed)
Scheduled appt per 10/30 los - left message with next appt date and time and sent reminder letter in the mail.

## 2017-10-06 ENCOUNTER — Other Ambulatory Visit: Payer: Self-pay | Admitting: *Deleted

## 2017-10-14 ENCOUNTER — Other Ambulatory Visit: Payer: Self-pay | Admitting: Oncology

## 2017-10-15 ENCOUNTER — Telehealth: Payer: Self-pay

## 2017-10-15 ENCOUNTER — Other Ambulatory Visit: Payer: Self-pay

## 2017-10-15 MED ORDER — ANASTROZOLE 1 MG PO TABS: 1.0000 mg | ORAL_TABLET | Freq: Every day | ORAL | 0 refills | Status: DC

## 2017-10-15 MED FILL — ANASTROZOLE 1 MG TABLET: 1 | 30 days supply | Qty: 30 | Fill #0

## 2017-10-15 NOTE — Telephone Encounter (Signed)
Pt called stating [facility] says it is 13 days too soon to refill anastrozole. She only has 4 left. Is it OK with Dr [name] to have his nurse call [facility] and tell them to fill the rx early?  She is going out of town tomorrow and does not want to be out of anastrozole for 14 days.

## 2017-10-25 ENCOUNTER — Telehealth: Payer: Self-pay

## 2017-10-25 ENCOUNTER — Other Ambulatory Visit: Payer: Self-pay | Admitting: Internal Medicine

## 2017-10-25 ENCOUNTER — Telehealth: Payer: Self-pay | Admitting: Oncology

## 2017-10-25 DIAGNOSIS — H938X9 Other specified disorders of ear, unspecified ear: Secondary | ICD-10-CM

## 2017-10-25 MED ORDER — METHYLPREDNISOLONE 4 MG PO TABS: ORAL_TABLET | ORAL | 0 refills | Status: DC

## 2017-10-25 MED ORDER — CYCLOBENZAPRINE HCL 5 MG PO TABS: 5.0000 mg | ORAL_TABLET | Freq: Two times a day (BID) | ORAL | 0 refills | Status: DC | PRN

## 2017-10-25 NOTE — Telephone Encounter (Signed)
Pt called stating she saw [facility] in [place] over holiday for ear congestion of 3 weeks duration. They suggested she see ENT. She is requesting referral to ENT.  She picked up grand neice and pulled her low back and has L low back pain into her left hip. Not down her legs. She used her sister's flexeril that helped. She has been using heating pad. Instructed her to use ice pack.  She is requesting a flexeril rx.   She is asking for a refill on solumedrol dose pack for her recurrent neck, back and shoulder pain.   S/w Dr [name] and Dr Mickeal Skinner about these requests.  Dr [name] said to set up with [facility] tomorrow 0900, before her 0930 herceptin infusion.  inbasket sent.   Called pt with 9 am [facility] appt and flexeril and medrol dose pack. And referral to Dr Benjamine Mola ENT.

## 2017-10-25 NOTE — Telephone Encounter (Signed)
Patient scheduled per 11/26 sch message. Patient aware of updates to schedule

## 2017-10-26 ENCOUNTER — Ambulatory Visit (HOSPITAL_BASED_OUTPATIENT_CLINIC_OR_DEPARTMENT_OTHER): Payer: Medicaid Other

## 2017-10-26 ENCOUNTER — Ambulatory Visit: Payer: Self-pay

## 2017-10-26 ENCOUNTER — Ambulatory Visit (HOSPITAL_BASED_OUTPATIENT_CLINIC_OR_DEPARTMENT_OTHER): Payer: Self-pay | Admitting: Medical

## 2017-10-26 VITALS — BP 125/73 | HR 86 | Temp 98.6°F | Resp 18

## 2017-10-26 DIAGNOSIS — C50312 Malignant neoplasm of lower-inner quadrant of left female breast: Secondary | ICD-10-CM

## 2017-10-26 DIAGNOSIS — Z5112 Encounter for antineoplastic immunotherapy: Secondary | ICD-10-CM

## 2017-10-26 DIAGNOSIS — C7951 Secondary malignant neoplasm of bone: Secondary | ICD-10-CM

## 2017-10-26 DIAGNOSIS — C7931 Secondary malignant neoplasm of brain: Secondary | ICD-10-CM

## 2017-10-26 DIAGNOSIS — H9203 Otalgia, bilateral: Secondary | ICD-10-CM

## 2017-10-26 LAB — CBC WITH DIFFERENTIAL/PLATELET
BASO%: 0.6 % (ref 0.0–2.0)
Basophils Absolute: 0 10*3/uL (ref 0.0–0.1)
EOS%: 0.1 % (ref 0.0–7.0)
Eosinophils Absolute: 0 10*3/uL (ref 0.0–0.5)
HCT: 35.7 % (ref 34.8–46.6)
HGB: 12.1 g/dL (ref 11.6–15.9)
LYMPH%: 11.3 % — ABNORMAL LOW (ref 14.0–49.7)
MCH: 33.3 pg (ref 25.1–34.0)
MCHC: 33.9 g/dL (ref 31.5–36.0)
MCV: 98.4 fL (ref 79.5–101.0)
MONO#: 0.1 10*3/uL (ref 0.1–0.9)
MONO%: 1.9 % (ref 0.0–14.0)
NEUT#: 3.6 10*3/uL (ref 1.5–6.5)
NEUT%: 86.1 % — ABNORMAL HIGH (ref 38.4–76.8)
Platelets: 167 10*3/uL (ref 145–400)
RBC: 3.63 10*6/uL — ABNORMAL LOW (ref 3.70–5.45)
RDW: 13.3 % (ref 11.2–14.5)
WBC: 4.2 10*3/uL (ref 3.9–10.3)
lymph#: 0.5 10*3/uL — ABNORMAL LOW (ref 0.9–3.3)

## 2017-10-26 LAB — COMPREHENSIVE METABOLIC PANEL
ALT: 48 U/L (ref 0–55)
AST: 35 U/L — ABNORMAL HIGH (ref 5–34)
Albumin: 3.7 g/dL (ref 3.5–5.0)
Alkaline Phosphatase: 34 U/L — ABNORMAL LOW (ref 40–150)
Anion Gap: 9 mEq/L (ref 3–11)
BUN: 12 mg/dL (ref 7.0–26.0)
CO2: 24 mEq/L (ref 22–29)
Calcium: 8.7 mg/dL (ref 8.4–10.4)
Chloride: 108 mEq/L (ref 98–109)
Creatinine: 0.8 mg/dL (ref 0.6–1.1)
EGFR: >60 mL/min/{1.73_m2} (ref >60)
Glucose: 158 mg/dl — ABNORMAL HIGH (ref 70–140)
Potassium: 3.8 mEq/L (ref 3.5–5.1)
Sodium: 140 mEq/L (ref 136–145)
Total Bilirubin: <0.22 mg/dL (ref 0.20–1.20)
Total Protein: 6.1 g/dL — ABNORMAL LOW (ref 6.4–8.3)

## 2017-10-26 MED ORDER — ACETAMINOPHEN 325 MG PO TABS: 650.0000 mg | ORAL_TABLET | Freq: Once | ORAL | Status: DC

## 2017-10-26 MED ORDER — TRASTUZUMAB CHEMO 150 MG IV SOLR
450.0000 mg | Freq: Once | INTRAVENOUS | Status: AC
  Administered 2017-10-26: 450 mg via INTRAVENOUS
  Filled 2017-10-26: qty 21.43

## 2017-10-26 MED ORDER — HEPARIN SOD (PORK) LOCK FLUSH 100 UNIT/ML IV SOLN
500.0000 [IU] | Freq: Once | INTRAVENOUS | Status: AC | PRN
  Administered 2017-10-26: 500 [IU]
  Filled 2017-10-26: qty 5

## 2017-10-26 MED ORDER — DIPHENHYDRAMINE HCL 25 MG PO CAPS: 25.0000 mg | ORAL_CAPSULE | Freq: Once | ORAL | Status: DC

## 2017-10-26 MED ORDER — DENOSUMAB 120 MG/1.7ML ~~LOC~~ SOLN
120.0000 mg | Freq: Once | SUBCUTANEOUS | Status: AC
  Administered 2017-10-26: 120 mg via SUBCUTANEOUS
  Filled 2017-10-26: qty 1.7

## 2017-10-26 MED ORDER — SODIUM CHLORIDE 0.9% FLUSH
10.0000 mL | INTRAVENOUS | Status: DC | PRN
  Administered 2017-10-26: 10 mL
  Filled 2017-10-26: qty 10

## 2017-10-26 MED ORDER — SODIUM CHLORIDE 0.9 % IV SOLN
Freq: Once | INTRAVENOUS | Status: AC
  Administered 2017-10-26: 10:00:00 via INTRAVENOUS

## 2017-10-26 NOTE — Progress Notes (Signed)
Pt arrived to [facility] unsure as to why she is here. Per telephone note from yesterday, pt had c/o back pain and congested ears.  She received prescriptions for flexiril and medrol dose pak for her back for Drs. Magrinat and Vaslow.  She states she only needs a [facility] ENT referral for her ears.  Message left for Dr. [name] nurse for ENT referral.  Pt not seen by [facility] provider as pt stated she had no need for that. Pt to receive scheduled herceptin after this appt.   RN visit only

## 2017-10-26 NOTE — Patient Instructions (Signed)
[facility] Discharge Instructions for Patients Receiving Chemotherapy  Today you received the following chemotherapy agents Herceptin  To help prevent nausea and vomiting after your treatment, we encourage you to take your nausea medication as directed   If you develop nausea and vomiting that is not controlled by your nausea medication, call the clinic.   BELOW ARE SYMPTOMS THAT SHOULD BE REPORTED IMMEDIATELY:  *FEVER GREATER THAN 100.5 F  *CHILLS WITH OR WITHOUT FEVER  NAUSEA AND VOMITING THAT IS NOT CONTROLLED WITH YOUR NAUSEA MEDICATION  *UNUSUAL SHORTNESS OF BREATH  *UNUSUAL BRUISING OR BLEEDING  TENDERNESS IN MOUTH AND THROAT WITH OR WITHOUT PRESENCE OF ULCERS  *URINARY PROBLEMS  *BOWEL PROBLEMS  UNUSUAL RASH Items with * indicate a potential emergency and should be followed up as soon as possible.  Feel free to call the clinic should you have any questions or concerns. The clinic phone number is [phone].  Please show the CHEMO ALERT CARD at check-in to the Emergency Department and triage nurse.   

## 2017-10-26 NOTE — Progress Notes (Signed)
[name] 56 y.o. 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  Pre op visit for MRI brain by sedation 11-11-17.   PAIN: She is currently is not having pain. Hurt her back during the Thanksgiving holiday. NEURO:Alert and oriented x 3 with fluent speech,able to complete sentences without difficulty with word finding or organization of sentences.  Denies having any problems with her memory. Denies any visual disturbance. Denies any nausea or vomiting,ataxia,ring of ears,dizziness or headache. Fine motor movement-picking up objects with fingers,holding objects, writing, weakness of lower extremities: No Aphasia/Slurred speech:No problems Fatigue:No Weight: Wt Readings from Last 3 Encounters:  10/28/17 135 lb (61.2 kg)  09/28/17 132 lb (59.9 kg)  09/10/17 125 lb 9.6 oz (57 kg)                              09-28-17 Saw Dr. Boyce Medici is now 2 years out from definitive diagnosis of metastatic breast cancer, with no evidence of active disease.  This is very favorable.  She is tolerating the trastuzumab/anastrozole/denosumab combination with no unusual side effects and the plan is to continue that indefinitely until there is evidence of disease progression  She will have a restaging brain MRI in December.  I am also setting her up for a right mammogram and a CT of the chest that same month  She is planning to return to [place] to complete her plastic treatments.  She is also much more stable from a psychological point of view.  As far as her pelvic dryness is concerned I am referring her to our "pelvic health" program here.  As far as the fatigue is concerned, I gave her information on the [facility] recovery program, our tai chi and yoga class, [facility] and strongly encouraged her to participate BP 122/73 (BP Location: Right Arm, Patient Position: Sitting, Cuff Size: Normal)   Pulse 80    Temp 98.2 F (36.8 C) (Oral)   Resp 18   Ht _0  (1.651 m)   Wt 135 lb (61.2 kg)   SpO2 98%   BMI 22.47 kg/m

## 2017-10-27 ENCOUNTER — Encounter: Payer: Self-pay | Admitting: Family Medicine

## 2017-10-27 LAB — CANCER ANTIGEN 27.29: CA 27.29: 15.5 U/mL (ref 0.0–38.6)

## 2017-10-28 ENCOUNTER — Encounter: Payer: Self-pay | Admitting: Radiation Oncology

## 2017-10-28 ENCOUNTER — Ambulatory Visit
Admission: RE | Admit: 2017-10-28 | Discharge: 2017-10-28 | Disposition: A | Discharge status: Home / Self Care | Payer: Medicaid Other | Source: Ambulatory Visit | Attending: Radiation Oncology | Admitting: Radiation Oncology

## 2017-10-28 ENCOUNTER — Other Ambulatory Visit: Payer: Self-pay

## 2017-10-28 VITALS — BP 122/73 | HR 80 | Temp 98.2°F | Resp 18 | Ht 65.0 in | Wt 135.0 lb

## 2017-10-28 DIAGNOSIS — Z9012 Acquired absence of left breast and nipple: Secondary | ICD-10-CM | POA: Diagnosis not present

## 2017-10-28 DIAGNOSIS — Z8249 Family history of ischemic heart disease and other diseases of the circulatory system: Secondary | ICD-10-CM | POA: Diagnosis not present

## 2017-10-28 DIAGNOSIS — C7931 Secondary malignant neoplasm of brain: Secondary | ICD-10-CM | POA: Insufficient documentation

## 2017-10-28 DIAGNOSIS — G8929 Other chronic pain: Secondary | ICD-10-CM | POA: Diagnosis not present

## 2017-10-28 DIAGNOSIS — K219 Gastro-esophageal reflux disease without esophagitis: Secondary | ICD-10-CM | POA: Diagnosis not present

## 2017-10-28 DIAGNOSIS — M797 Fibromyalgia: Secondary | ICD-10-CM | POA: Diagnosis not present

## 2017-10-28 DIAGNOSIS — F4310 Post-traumatic stress disorder, unspecified: Secondary | ICD-10-CM | POA: Insufficient documentation

## 2017-10-28 DIAGNOSIS — R51 Headache: Secondary | ICD-10-CM | POA: Insufficient documentation

## 2017-10-28 DIAGNOSIS — Z9882 Breast implant status: Secondary | ICD-10-CM | POA: Insufficient documentation

## 2017-10-28 DIAGNOSIS — R110 Nausea: Secondary | ICD-10-CM | POA: Insufficient documentation

## 2017-10-28 DIAGNOSIS — Z923 Personal history of irradiation: Secondary | ICD-10-CM | POA: Diagnosis not present

## 2017-10-28 DIAGNOSIS — J9811 Atelectasis: Secondary | ICD-10-CM | POA: Diagnosis not present

## 2017-10-28 DIAGNOSIS — C7951 Secondary malignant neoplasm of bone: Secondary | ICD-10-CM | POA: Diagnosis not present

## 2017-10-28 DIAGNOSIS — F17210 Nicotine dependence, cigarettes, uncomplicated: Secondary | ICD-10-CM | POA: Insufficient documentation

## 2017-10-28 DIAGNOSIS — C50312 Malignant neoplasm of lower-inner quadrant of left female breast: Secondary | ICD-10-CM

## 2017-10-28 DIAGNOSIS — F319 Bipolar disorder, unspecified: Secondary | ICD-10-CM | POA: Diagnosis not present

## 2017-10-28 DIAGNOSIS — Z9221 Personal history of antineoplastic chemotherapy: Secondary | ICD-10-CM | POA: Diagnosis not present

## 2017-10-28 NOTE — Progress Notes (Deleted)
test

## 2017-10-28 NOTE — Progress Notes (Signed)
Radiation Oncology         [phone] ________________________________  Name: [name] MRN: [id number]  Date: 10/28/2017  DOB: Apr 28, 1961  QB:VQXI, Shannon Ferretti, MD  [name], MD     REFERRING PHYSICIAN: [name], MD   DIAGNOSIS: The primary encounter diagnosis was Primary cancer of lower-inner quadrant of left female breast ([place]). A diagnosis of Brain metastasis ([place]) was also pertinent to this visit.   HISTORY OF PRESENT ILLNESS: [name] is a 56 y.o. female with a history of stage IV breast cancer. She noticed a lump the size of a lemon in her left axillary region for a year prior to her mammogram. She requested a mammogram after her breast started swelling. Diagnostic imaging followed by biopsy in August 2016 revealed an invasive lobular carcinoma, triple positive. She subsequently underwent mastectomy with axillary lymph node dissection in tissue expander placement on 08/21/2015. The tumor was consistent with invasive indeterminate grade mammary carcinoma with mixed ductal and lobular features, and high-grade ductal carcinoma in situ, the tumor size was 1.3 cm, sentinel axillary lymph nodes demonstrated 4 out of 15 being positive for metastatic carcinoma. She was found to have a sclerotic lesion in the posterior left iliac bone, there is questionable lesions in the liver, as well as pulmonary nodules. She began systemic chemotherapy in November 2016 with Taxotere, Herceptin projection. She completed her Taxotere and March 2017 and continues on Herceptin and Perjeta with monthly Xgeva. She did have a posttreatment CT scan to restage on 03/02/2016, this revealed multiple new ill-defined right upper lobe pulmonary parenchymal nodules, lobular dependent pleural fluid and potential mild pleural thickening bilaterally, numerous low-attenuation foci again are present within the liver slightly increased that have not significantly changed in size. The sclerotic lesion in the  posterior left iliac bone was stable. She began radiotherapy on 05/08/2016 and was to receive 33 fractions. She has moved to [place], received the remainder of her radiotherapy. 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 She's been followed since in surveillance and has had stability of the resection cavity and of the brain in general. She is due for a repeat MRI of the brain which is scheduled for 11/11/17 and will be performed under general anesthesia. She comes today for preanesthesia clearance.   PREVIOUS RADIATION THERAPY:   09/17/16-10/07/16: Whole brain treated to 35 Gy in 14 fractions  06/03/16-06/26/16: Completion of treatment to the left chest wall and nodes in [facility] with Dr. Lisbeth Renshaw: total of 45Gy  Summer 2017: received radiotherapy to the left chest wall and regional lymph nodes, received 15 fractions per Dr. Maryan Rued near [place], [place].   PAST MEDICAL HISTORY:  Past Medical History:  Diagnosis Date  . Alcohol abuse   . Anemia    during chemo  . Anxiety    At age 85  . Arthritis Dx 2010  . Bipolar disorder ([place])   . Cancer ([place])    breast mets to brain  . Chronic pain   . Complication of anesthesia   . Depression   . Fibromyalgia Dx 2005  . GERD (gastroesophageal reflux disease)   . Headache    hx  migraines  . Opiate dependence ([place])   . PONV (postoperative nausea and vomiting)   . Port-A-Cath in place   .  PTSD (post-traumatic stress disorder)        PAST SURGICAL HISTORY: Past Surgical History:  Procedure Laterality Date  . APPLICATION OF CRANIAL NAVIGATION N/A 08/14/2016   Procedure: APPLICATION OF CRANIAL NAVIGATION;  Surgeon: Erline Levine, MD;  Location: [facility];  Service: Neurosurgery;   Laterality: N/A;  . BREAST RECONSTRUCTION Left    with silicone implant  . CRANIOTOMY N/A 08/14/2016   Procedure: CRANIOTOMY TUMOR EXCISION WITH Lucky Rathke;  Surgeon: Erline Levine, MD;  Location: [facility];  Service: Neurosurgery;  Laterality: N/A;  . FIBULA FRACTURE SURGERY Left   . MASTECTOMY Left   . RADIOLOGY WITH ANESTHESIA N/A 07/23/2016   Procedure: MRI OF BRAIN WITH AND WITHOUT;  Surgeon: Medication Radiologist, MD;  Location: [facility];  Service: Radiology;  Laterality: N/A;  . RADIOLOGY WITH ANESTHESIA N/A 09/08/2016   Procedure: MRI OF BRAIN WITH AND WITHOUT CONTRAST;  Surgeon: Medication Radiologist, MD;  Location: [facility];  Service: Radiology;  Laterality: N/A;  . RADIOLOGY WITH ANESTHESIA N/A 12/10/2016   Procedure: MRI OF BRAIN WITH AND WITHOUT;  Surgeon: Medication Radiologist, MD;  Location: [facility];  Service: Radiology;  Laterality: N/A;  . RADIOLOGY WITH ANESTHESIA N/A 03/02/2017   Procedure: MRI of BRAIN W and W/OUT CONTRAST;  Surgeon: Medication Radiologist, MD;  Location: [facility];  Service: Radiology;  Laterality: N/A;  . RADIOLOGY WITH ANESTHESIA N/A 07/29/2017   Procedure: RADIOLOGY WITH ANESTHESIA MRI OF BRAIN WITH AND WITHOUT CONTRAST;  Surgeon: Radiologist, Medication, MD;  Location: [facility];  Service: Radiology;  Laterality: N/A;  . right power port placement Right      FAMILY HISTORY:  Family History  Problem Relation Age of Onset  . Diabetes Mother   . Bipolar disorder Mother   . CAD Father      SOCIAL HISTORY:  reports that she has been smoking cigarettes.  She has been smoking about 1.00 pack per day. she has never used smokeless tobacco. She reports that she does not drink alcohol or use drugs.She is divorced and lives in [place]. She is a recovering alcoholic and had been clean also from using prescription pain pills for about 4 years time when we met her in 2017, however has had episodes of non compliance with her medical care with suspicion for intermittent drug  use.  ALLERGIES: Demerol and Erythromycin   MEDICATIONS:  Current Outpatient Medications  Medication Sig Dispense Refill  . anastrozole (ARIMIDEX) 1 MG tablet Take 1 tablet (1 mg total) daily by mouth. 30 tablet 0  . Aspirin-Acetaminophen-Caffeine (GOODY HEADACHE PO) Take 1 packet by mouth 3 (three) times daily as needed (headaches).    [name] BIOTIN PO Take 1 tablet by mouth daily.    . Cholecalciferol (VITAMIN D3) 5000 units CAPS Take 5,000 Units by mouth daily.    . cyclobenzaprine (FLEXERIL) 5 MG tablet Take 1 tablet (5 mg total) by mouth 2 (two) times daily as needed for muscle spasms. 10 tablet 0  . cycloSPORINE (RESTASIS) 0.05 % ophthalmic emulsion Place 1 drop into both eyes 2 (two) times daily.    [name] docusate sodium (COLACE) 100 MG capsule Take 100 mg by mouth daily.    . fluticasone (FLONASE) 50 MCG/ACT nasal spray Place 2 sprays into both nostrils daily. 16 g 0  . gabapentin (NEURONTIN) 300 MG capsule Take 1 capsule (300 mg total) by mouth 2 (two) times daily. 60 capsule 3  . guaiFENesin (MUCINEX) 600 MG 12 hr tablet Take 600 mg by mouth 2 (two) times  daily. May take an additional 600 mg twice daily as needed for congestion    . ibuprofen (ADVIL,MOTRIN) 800 MG tablet Take 1 tablet (800 mg total) by mouth 3 (three) times daily. 90 tablet 0  . loratadine (CLARITIN) 10 MG tablet Take 1 tablet (10 mg total) by mouth daily. 30 tablet 11  . loratadine-pseudoephedrine (CLARITIN-D 12 HOUR) 5-120 MG tablet Take 1 tablet by mouth 2 (two) times daily as needed for allergies. 20 tablet 1  . lurasidone (LATUDA) 40 MG TABS tablet Take 1 tablet (40 mg total) by mouth daily with breakfast. 30 tablet 0  . methylPREDNISolone (MEDROL) 4 MG tablet 21 tablet medrol dose pack, 6 day taper 21 tablet 0  . pantoprazole (PROTONIX) 40 MG tablet TAKE 1 TABLET BY MOUTH EVERY DAY 30 tablet 3  . polyethylene glycol (MIRALAX / GLYCOLAX) packet Take 17 g by mouth daily as needed for mild constipation. 30 each 1  .  traMADol (ULTRAM) 50 MG tablet Take 1 tablet (50 mg total) by mouth every 12 (twelve) hours as needed. 40 tablet 0  . traZODone (DESYREL) 100 MG tablet Take 1 tablet (100 mg total) by mouth at bedtime. (Patient taking differently: Take 200 mg by mouth at bedtime. ) 30 tablet 0  . dextromethorphan-guaiFENesin (MUCINEX DM) 30-600 MG 12hr tablet Take 1 tablet by mouth 2 (two) times daily. (Patient not taking: Reported on 10/28/2017) 30 tablet 1  . lamoTRIgine (LAMICTAL) 25 MG tablet Take 1 tablet (25 mg total) by mouth 2 (two) times daily. (Patient not taking: Reported on 10/28/2017) 60 tablet 0  . ondansetron (ZOFRAN) 8 MG tablet Take 1 tablet (8 mg total) by mouth every 8 (eight) hours as needed for nausea or vomiting. (Patient not taking: Reported on 10/28/2017) 90 tablet 1   Current Facility-Administered Medications  Medication Dose Route Frequency Provider Last Rate Last Dose  . betamethasone acetate-betamethasone sodium phosphate (CELESTONE) injection 3 mg  3 mg Intramuscular Once Edrick Kins, DPM        REVIEW OF SYSTEMS: On review of systems, the patient reports that she is doing well overall. She reports she has dry mouth and dry mucous membranes. She denies any chest pain, shortness of breath, cough, fevers, chills, night sweats, unintended weight changes. She also reports fullness in her left ear and has been using flonase. She denies any  bladder disturbances, and denies abdominal pain, nausea or vomiting. She notes occasional constipation and takes miralax for this. She denies any new musculoskeletal or joint aches or pains, new skin lesions or concerns. A complete review of systems is obtained and is otherwise negative.   PHYSICAL EXAM:  Wt Readings from Last 3 Encounters:  10/28/17 135 lb (61.2 kg)  09/28/17 132 lb (59.9 kg)  09/10/17 125 lb 9.6 oz (57 kg)   Temp Readings from Last 3 Encounters:  10/28/17 98.2 F (36.8 C) (Oral)  10/26/17 98.6 F (37 C) (Oral)  09/28/17 98.2  F (36.8 C) (Oral)   BP Readings from Last 3 Encounters:  10/28/17 122/73  10/26/17 125/73  09/28/17 127/73   Pulse Readings from Last 3 Encounters:  10/28/17 80  10/26/17 86  09/28/17 85   Pain Assessment Pain Score: 0-No pain/10 In general this is a well appearing caucasian female in no acute distress. She is alert and oriented x4 and appropriate throughout the examination. HEENT reveals that the patient is normocephalic, atraumatic. EOMs are intact. PERRLA. External auditory canal is evaluated bilaterally, and there is some serous fluid  posterior to the TM on the left, none on the right, no evidence of cerumen impaction.  Skin is intact without any evidence of gross lesions. Cardiovascular exam reveals a regular rate and rhythm, no clicks rubs or murmurs are auscultated. Chest is clear to auscultation bilaterally. Lymphatic assessment is performed and does not reveal any adenopathy in the cervical, supraclavicular, axillary, or inguinal chains. Abdomen has active bowel sounds in all quadrants and is intact. The abdomen is soft, non tender, non distended. Lower extremities are negative for pretibial pitting edema, deep calf tenderness, cyanosis or clubbing.   ECOG = 1 0 - Asymptomatic (Fully active, able to carry on all predisease activities without restriction)  1 - Symptomatic but completely ambulatory (Restricted in physically strenuous activity but ambulatory and able to carry out work of a light or sedentary nature. For example, light housework, office work)  2 - Symptomatic, <50% in bed during the day (Ambulatory and capable of all self care but unable to carry out any work activities. Up and about more than 50% of waking hours)  3 - Symptomatic, >50% in bed, but not bedbound (Capable of only limited self-care, confined to bed or chair 50% or more of waking hours)  4 - Bedbound (Completely disabled. Cannot carry on any self-care. Totally confined to bed or chair)  5 - Death    Eustace Pen MM, Creech RH, Tormey DC, et al. [id number]). "Toxicity and response criteria of the [facility]". [place] Oncol. 5 (6): 649-55    LABORATORY DATA:  Lab Results  Component Value Date   WBC 4.2 10/26/2017   HGB 12.1 10/26/2017   HCT 35.7 10/26/2017   MCV 98.4 10/26/2017   PLT 167 10/26/2017   Lab Results  Component Value Date   NA 140 10/26/2017   K 3.8 10/26/2017   CL 109 07/29/2017   CO2 24 10/26/2017   Lab Results  Component Value Date   ALT 48 10/26/2017   AST 35 (H) 10/26/2017   ALKPHOS 34 (L) 10/26/2017   BILITOT <0.22 10/26/2017      RADIOGRAPHY: No results found.     IMPRESSION/PLAN:  1. Stage IV, T4, N3, M1 invasive lobular and ductal carcinoma, ER positive, HER-2 positive the left breast with metastatic disease to bone and brain. The patient is ready to proceed with her MRI of the brain with contrast with general anesthesia. She will also see the anesthesia team the morning of her procedure. She will return to discuss findings with Dr. Mickeal Skinner and I will also touch base regarding results by phone. She is in agreement with this plan and we anticipate repeat imaging every 3 months.   2. Headaches.  The patient is due to follow-up with Dr. Mickeal Skinner.  She is already scheduled for her post MRI review with him so they can review visit her MRI as well as headaches.  3. Allergic rhinitis and probable eustachian tube dysfunction. The patient will be evaluated with ENT and hopefully will have resolution with OTC Mucinex DM as well.   In a visit lasting 25 minutes, greater than 50% of the time was spent face to face discussing her symptoms, and coordinating the patient's care.    Carola Rhine, PAC

## 2017-10-28 NOTE — Addendum Note (Signed)
Encounter addended by: Malena Edman, RN on: 10/28/2017 2:40 PM  Actions taken: Charge Capture section accepted

## 2017-10-29 ENCOUNTER — Other Ambulatory Visit: Payer: Self-pay | Admitting: Oncology

## 2017-11-01 ENCOUNTER — Telehealth: Payer: Self-pay | Admitting: Oncology

## 2017-11-01 NOTE — Telephone Encounter (Signed)
Per 11/29 sch message - called patient back but there was no answer - left message with call back number for patient in regards to r/s an appt.

## 2017-11-02 ENCOUNTER — Other Ambulatory Visit: Payer: Self-pay

## 2017-11-02 ENCOUNTER — Telehealth: Payer: Self-pay

## 2017-11-02 DIAGNOSIS — H6502 Acute serous otitis media, left ear: Secondary | ICD-10-CM

## 2017-11-02 MED ORDER — CYCLOBENZAPRINE HCL 5 MG PO TABS: 5.0000 mg | ORAL_TABLET | Freq: Two times a day (BID) | ORAL | 0 refills | Status: DC | PRN

## 2017-11-02 NOTE — Telephone Encounter (Signed)
Per pt request via telephone conversation with this RN infusion appt on 12/24 cancelled d/t pt will be out of town.  Pt is aware of next scheduled appt's on 12/20/17.  Central scheduling notified to call pt to schedule her CT chest.  Flexeril prescription sent to [facility] via fax  Pt to get records from [facility] in [place] faxed to this office so that we may facilitate a referral to an ENT.

## 2017-11-02 NOTE — Telephone Encounter (Signed)
New pt referral info faxed to [facility] Appt scheduled for pt on 12/11 at 10:00 with Dr Blenda Nicely.  Pt is aware of date and time.

## 2017-11-03 ENCOUNTER — Other Ambulatory Visit: Payer: Self-pay | Admitting: Oncology

## 2017-11-05 ENCOUNTER — Encounter: Payer: Self-pay | Admitting: *Deleted

## 2017-11-05 ENCOUNTER — Ambulatory Visit (HOSPITAL_COMMUNITY): Payer: Medicaid Other

## 2017-11-05 DIAGNOSIS — H903 Sensorineural hearing loss, bilateral: Secondary | ICD-10-CM | POA: Insufficient documentation

## 2017-11-05 NOTE — Progress Notes (Signed)
56 Returned call in re: to hearing loss mentioned the conversation from 10-28-17 and reminded her of Bryson Ha Perkin's plan as follows  Allergic rhinitis and probable eustachian tube dysfunction. The patient will be evaluated with ENT and hopefully will have resolution with OTC Mucinex DM as well. I mentioned that Bryson Ha is out of the office on Fridays and will return on Monday.  She may call back to [phone].

## 2017-11-09 ENCOUNTER — Telehealth: Payer: Self-pay | Admitting: Radiation Oncology

## 2017-11-09 ENCOUNTER — Ambulatory Visit: Payer: Self-pay | Admitting: Radiation Oncology

## 2017-11-09 DIAGNOSIS — R609 Edema, unspecified: Secondary | ICD-10-CM | POA: Insufficient documentation

## 2017-11-09 DIAGNOSIS — R600 Localized edema: Secondary | ICD-10-CM

## 2017-11-09 NOTE — Telephone Encounter (Signed)
I spoke with the pt and she developed LUE edema over the weekend. She denies shortness of breath, chest pain, fevers, redness, recent IV or needle sticks, scratches or folliculitis. I counseled her on going to ED for stat doppler, and she is not in agreement to go. She will proceed tomorrow with stat doppler after her CT but is cautioned to go to the ED if she develops any acute symptoms.      Carola Rhine, PAC

## 2017-11-10 ENCOUNTER — Encounter (HOSPITAL_COMMUNITY): Payer: Self-pay

## 2017-11-10 ENCOUNTER — Encounter: Payer: Self-pay | Admitting: *Deleted

## 2017-11-10 ENCOUNTER — Encounter (HOSPITAL_COMMUNITY): Payer: Self-pay | Admitting: Emergency Medicine

## 2017-11-10 ENCOUNTER — Other Ambulatory Visit: Payer: Self-pay

## 2017-11-10 ENCOUNTER — Ambulatory Visit (HOSPITAL_BASED_OUTPATIENT_CLINIC_OR_DEPARTMENT_OTHER)
Admission: RE | Admit: 2017-11-10 | Discharge: 2017-11-10 | Disposition: A | Discharge status: Home / Self Care | Payer: Medicaid Other | Source: Ambulatory Visit | Attending: Radiation Oncology | Admitting: Radiation Oncology

## 2017-11-10 ENCOUNTER — Other Ambulatory Visit: Payer: Self-pay | Admitting: Radiation Oncology

## 2017-11-10 ENCOUNTER — Encounter (HOSPITAL_COMMUNITY): Payer: Self-pay | Admitting: *Deleted

## 2017-11-10 ENCOUNTER — Telehealth: Payer: Self-pay | Admitting: *Deleted

## 2017-11-10 ENCOUNTER — Ambulatory Visit (HOSPITAL_COMMUNITY)
Admission: RE | Admit: 2017-11-10 | Discharge: 2017-11-10 | Disposition: A | Discharge status: Home / Self Care | Payer: Medicaid Other | Source: Ambulatory Visit | Attending: Oncology | Admitting: Oncology

## 2017-11-10 ENCOUNTER — Encounter: Payer: Self-pay | Admitting: Radiation Oncology

## 2017-11-10 DIAGNOSIS — C7951 Secondary malignant neoplasm of bone: Secondary | ICD-10-CM

## 2017-11-10 DIAGNOSIS — C50919 Malignant neoplasm of unspecified site of unspecified female breast: Secondary | ICD-10-CM

## 2017-11-10 DIAGNOSIS — C50312 Malignant neoplasm of lower-inner quadrant of left female breast: Secondary | ICD-10-CM | POA: Insufficient documentation

## 2017-11-10 DIAGNOSIS — C7931 Secondary malignant neoplasm of brain: Secondary | ICD-10-CM | POA: Diagnosis present

## 2017-11-10 DIAGNOSIS — R600 Localized edema: Secondary | ICD-10-CM

## 2017-11-10 MED ORDER — IOPAMIDOL (ISOVUE-300) INJECTION 61%
75.0000 mL | Freq: Once | INTRAVENOUS | Status: AC | PRN
  Administered 2017-11-10: 75 mL via INTRAVENOUS

## 2017-11-10 MED ORDER — IOPAMIDOL (ISOVUE-300) INJECTION 61%
INTRAVENOUS | Status: AC
  Filled 2017-11-10: qty 75

## 2017-11-10 MED ORDER — HEPARIN SOD (PORK) LOCK FLUSH 100 UNIT/ML IV SOLN
INTRAVENOUS | Status: AC
  Administered 2017-11-10: 500 [IU]
  Filled 2017-11-10: qty 5

## 2017-11-10 MED ORDER — HEPARIN SOD (PORK) LOCK FLUSH 100 UNIT/ML IV SOLN: 500.0000 [IU] | Freq: Once | INTRAVENOUS | Status: DC

## 2017-11-10 NOTE — Progress Notes (Addendum)
The patient is seen today for a brief encounter with concerns of LUE edema. As per my phone call with her yesterday, she noted LUE edema that began over the weekend without any known injury, pain, or excoriations/IV sticks. She has not had any fevers, chills, shortness of breath, chest pain, or redness of the extremity.   PE: Wt Readings from Last 3 Encounters:  10/28/17 135 lb (61.2 kg)  09/28/17 132 lb (59.9 kg)  09/10/17 125 lb 9.6 oz (57 kg)   Temp Readings from Last 3 Encounters:  10/28/17 98.2 F (36.8 C) (Oral)  10/26/17 98.6 F (37 C) (Oral)  09/28/17 98.2 F (36.8 C) (Oral)   BP Readings from Last 3 Encounters:  10/28/17 122/73  10/26/17 125/73  09/28/17 127/73   Pulse Readings from Last 3 Encounters:  10/28/17 80  10/26/17 86  09/28/17 85   In general this is a well appearing caucasian female in no acute distress. She's alert and oriented x4 and appropriate throughout the examination. Cardiopulmonary assessment is negative for acute distress and she exhibits normal effort. Her upper arm on the left is notable for 1+ pitting edema that extends to the forearm on the left but does not involve the hand. She has adequate capillary refill, warmth of the skin without disruption of palpable radial and brachial pulses.   A/P: 1. LUE edema in the setting of Left breast cancer. Given that the patient is a cancer patient, that she is taking antiestrogen therapy her Wells Score indicates concern for possible DVT. Fortunately clinically she appears well. She will proceed with STAT doppler ultrasound. We will follow up with these results and we discussed the possibility of anticoagulation if needed. She could also have lymphedema with is also likely given her history. If there is no clot, I will set her up for stat evaluation with physical therapy.     Carola Rhine, PAC   Addendum:  The patient has rescheduled her appointment for MRI, and that puts her last H&P out of date.  Please reference this encounter on 11/10/17 as our face to face evaluation. She is cleared to meet from my perspective with the anesthesia department prior to proceeding with general anesthesia for her brain MRI.    Carola Rhine, [facility] 11/22/17

## 2017-11-10 NOTE — Progress Notes (Signed)
Pt denies any acute cardiopulmonary issues. Pt under the care of Dr. Jason Nest, Cardiology. Pt denies having a cardiac cath and stress test. Pt denies having a chest x ray within the last year; pt has had chest CT's. Pt denies recent labs. Pt made aware to stop taking vitamins, fish oil, Biotin, Claritin-D and herbal medications. Pt verbalized understanding of all pre-op instructions.

## 2017-11-10 NOTE — Progress Notes (Signed)
I received a call from Vascular Lab. The patient's Stat doppler ultrasound of LUE was negative for DVT. I will arrange for pt to go to PT for lymphedema management. An urgent referral will be placed and the patient contacted by our office. The vascular tech I spoke with will share this with the patient.     Carola Rhine, PAC

## 2017-11-10 NOTE — Progress Notes (Signed)
Left upper extremity venous duplex completed. No evidence of DVT or superficial thrombosis. [facility], [place] 11/10/2017, 11:48 AM

## 2017-11-10 NOTE — Telephone Encounter (Signed)
Called patient to inform of test (vas. Ultrasound) for 11-10-17- arrival time - 10:45 am @ [facility], no restrictions to test,spoke with patient and she is aware of this test.

## 2017-11-11 ENCOUNTER — Ambulatory Visit: Payer: Medicaid Other | Attending: Oncology | Admitting: Physical Therapy

## 2017-11-11 ENCOUNTER — Other Ambulatory Visit: Payer: Self-pay | Admitting: Oncology

## 2017-11-11 ENCOUNTER — Encounter: Payer: Self-pay | Admitting: Physical Therapy

## 2017-11-11 ENCOUNTER — Ambulatory Visit (HOSPITAL_COMMUNITY): Admission: RE | Admit: 2017-11-11 | Payer: Medicaid Other | Source: Ambulatory Visit

## 2017-11-11 ENCOUNTER — Ambulatory Visit (HOSPITAL_COMMUNITY)
Admission: RE | Admit: 2017-11-11 | Discharge: 2017-11-11 | Disposition: A | Discharge status: Home / Self Care | Payer: Medicaid Other | Source: Ambulatory Visit | Attending: Radiation Oncology | Admitting: Radiation Oncology

## 2017-11-11 DIAGNOSIS — I972 Postmastectomy lymphedema syndrome: Secondary | ICD-10-CM | POA: Diagnosis present

## 2017-11-11 DIAGNOSIS — R293 Abnormal posture: Secondary | ICD-10-CM | POA: Insufficient documentation

## 2017-11-11 DIAGNOSIS — M79602 Pain in left arm: Secondary | ICD-10-CM | POA: Insufficient documentation

## 2017-11-11 DIAGNOSIS — M25612 Stiffness of left shoulder, not elsewhere classified: Secondary | ICD-10-CM | POA: Diagnosis present

## 2017-11-11 HISTORY — DX: Family history of other specified conditions: Z84.89

## 2017-11-11 HISTORY — DX: Lymphedema, not elsewhere classified: I89.0

## 2017-11-11 SURGERY — RADIOLOGY WITH ANESTHESIA
Anesthesia: General

## 2017-11-11 NOTE — Therapy (Signed)
[place], [place], [address] Phone: [phone]   Fax:  [phone]  Physical Therapy Evaluation  Patient Details  Name: [name] MRN: [id number] Date of Birth: 02-12-1961 Referring Provider: [name], PA-C   Encounter Date: 11/11/2017  PT End of Session - 11/11/17 1049    Visit Number  1    Number of Visits  16    Date for PT Re-Evaluation  01/26/18    PT Start Time  0940    PT Stop Time  1051    PT Time Calculation (min)  71 min    Activity Tolerance  Patient tolerated treatment well    Behavior During Therapy  [facility] for tasks assessed/performed       Past Medical History:  Diagnosis Date  . Alcohol abuse   . Anemia    during chemo  . Anxiety    At age 59  . Arthritis Dx 2010  . Bipolar disorder ([place])   . Cancer ([place])    breast mets to brain  . Chronic pain   . Complication of anesthesia   . Depression   . Family history of adverse reaction to anesthesia    parents had PONV  . Fibromyalgia Dx 2005  . GERD (gastroesophageal reflux disease)   . Headache    hx  migraines  . Lymphedema of left arm   . Opiate dependence ([place])   . PONV (postoperative nausea and vomiting)   . Port-A-Cath in place   . PTSD (post-traumatic stress disorder)     Past Surgical History:  Procedure Laterality Date  . APPLICATION OF CRANIAL NAVIGATION N/A 08/14/2016   Procedure: APPLICATION OF CRANIAL NAVIGATION;  Surgeon: Erline Levine, MD;  Location: [place] [facility];  Service: Neurosurgery;  Laterality: N/A;  . BREAST RECONSTRUCTION Left    with silicone implant  . CRANIOTOMY N/A 08/14/2016   Procedure: CRANIOTOMY TUMOR EXCISION WITH Lucky Rathke;  Surgeon: Erline Levine, MD;  Location: [place] [facility];  Service: Neurosurgery;  Laterality: N/A;  . FIBULA FRACTURE SURGERY Left   . MASTECTOMY Left   . RADIOLOGY WITH ANESTHESIA N/A 07/23/2016   Procedure: MRI OF BRAIN WITH AND WITHOUT;  Surgeon: Medication  Radiologist, MD;  Location: [place];  Service: Radiology;  Laterality: N/A;  . RADIOLOGY WITH ANESTHESIA N/A 09/08/2016   Procedure: MRI OF BRAIN WITH AND WITHOUT CONTRAST;  Surgeon: Medication Radiologist, MD;  Location: [place];  Service: Radiology;  Laterality: N/A;  . RADIOLOGY WITH ANESTHESIA N/A 12/10/2016   Procedure: MRI OF BRAIN WITH AND WITHOUT;  Surgeon: Medication Radiologist, MD;  Location: [place];  Service: Radiology;  Laterality: N/A;  . RADIOLOGY WITH ANESTHESIA N/A 03/02/2017   Procedure: MRI of BRAIN W and W/OUT CONTRAST;  Surgeon: Medication Radiologist, MD;  Location: [place];  Service: Radiology;  Laterality: N/A;  . RADIOLOGY WITH ANESTHESIA N/A 07/29/2017   Procedure: RADIOLOGY WITH ANESTHESIA MRI OF BRAIN WITH AND WITHOUT CONTRAST;  Surgeon: Radiologist, Medication, MD;  Location: [place];  Service: Radiology;  Laterality: N/A;  . right power port placement Right     There were no vitals filed for this visit.   Subjective Assessment - 11/11/17 0950    Subjective  Patient reports left arm swelling began 11/06/17 and she underwent a doppler 11/10/17 which was negative.    Pertinent History  Left mastectomy and sentinel node biopsy 08/21/15 and then axillary dissection following that which showed 14/15 positive nodes. Then chemotherapy and radiation after breast reconstruction.  Brain tumor removed 08/14/16 which was metastatic disease followed by full brain radiation. Surgery and chemo were done in [place]. 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 Currently she continues with Xgeva and Herceptin and reports her disease is stable.    Patient Stated Goals  reduce arm swelling    Currently in Pain?  Yes    Pain Score  5     Pain Location  Arm    Pain Orientation  Left    Pain Descriptors / Indicators  Aching;Tightness    Pain Type  Acute pain    Pain Onset  In the past 7 days    Pain Frequency  Constant    Aggravating  Factors   Nothing    Pain Relieving Factors  Nothing    Multiple Pain Sites  No         [facility] PT Assessment - 11/11/17 0001      Assessment   Medical Diagnosis  Left arm lymphedema    Referring Provider  [name], PA-C    Onset Date/Surgical Date  11/06/17    Hand Dominance  Right    Prior Therapy  none      Precautions   Precautions  Other (comment)    Precaution Comments  metastatic breast cancer      Restrictions   Weight Bearing Restrictions  No      Balance Screen   Has the patient fallen in the past 6 months  No    Has the patient had a decrease in activity level because of a fear of falling?   No    Is the patient reluctant to leave their home because of a fear of falling?   No      Home Film/video editor residence    Living Arrangements  Other (Comment) roommate    Available Help at Discharge  Friend(s)      Prior Function   Level of Independence  Independent    Vocation  On disability    Vocation Requirements  Volunteers    Leisure  She does not exercise      Cognition   Overall Cognitive Status  Within Functional Limits for tasks assessed      Observation/Other Assessments   Other Surveys   -- Lymphedema Life Impact Scale score 18 with impairment of 26%      Posture/Postural Control   Posture/Postural Control  Postural limitations    Postural Limitations  Rounded Shoulders;Forward head      ROM / Strength   AROM / PROM / Strength  AROM      AROM   AROM Assessment Site  Shoulder    Right/Left Shoulder  Right;Left    Right Shoulder Extension  54 Degrees    Right Shoulder Flexion  145 Degrees    Right Shoulder ABduction  150 Degrees    Right Shoulder Internal Rotation  60 Degrees    Right Shoulder External Rotation  90 Degrees    Left Shoulder Extension  48 Degrees    Left Shoulder Flexion  122 Degrees    Left Shoulder ABduction  120 Degrees    Left Shoulder Internal Rotation  66 Degrees    Left Shoulder External  Rotation  72 Degrees        LYMPHEDEMA/ONCOLOGY QUESTIONNAIRE - 11/11/17 1002      Type   Cancer Type  Left breast cancer  Surgeries   Mastectomy Date  08/21/15    Axillary Lymph Node Dissection Date  08/21/15    Number Lymph Nodes Removed  20      Date Lymphedema/Swelling Started   Date  11/06/17      Treatment   Active Chemotherapy Treatment  Yes    Past Chemotherapy Treatment  Yes    Active Radiation Treatment  No    Past Radiation Treatment  Yes    Body Site  left chest and brain    Current Hormone Treatment  Yes      What other symptoms do you have   Are you Having Heaviness or Tightness  Yes    Are you having Pain  Yes    Are you having pitting edema  Yes    Body Site  anterior forearm    Is it Hard or Difficult finding clothes that fit  No    Do you have infections  No    Is there Decreased scar mobility  Yes      Lymphedema Stage   Stage  STAGE 2 SPONTANEOUSLY IRREVERSIBLE      Lymphedema Assessments   Lymphedema Assessments  Upper extremities      Right Upper Extremity Lymphedema   At Axilla   27.9 cm    15 cm Proximal to Olecranon Process  25.9 cm    10 cm Proximal to Olecranon Process  23.9 cm    Olecranon Process  21.7 cm    15 cm Proximal to Ulnar Styloid Process  21.2 cm    10 cm Proximal to Ulnar Styloid Process  18.5 cm    Just Proximal to Ulnar Styloid Process  14.4 cm    Across Hand at [employer]  16.8 cm    At [place] of 2nd Digit  5.8 cm      Left Upper Extremity Lymphedema   At Axilla   30.1 cm    15 cm Proximal to Olecranon Process  29.8 cm    10 cm Proximal to Olecranon Process  29.7 cm    Olecranon Process  25.4 cm    15 cm Proximal to Ulnar Styloid Process  25.9 cm    10 cm Proximal to Ulnar Styloid Process  21.9 cm    Just Proximal to Ulnar Styloid Process  15.1 cm    Across Hand at [employer]  16.6 cm    At [place] of 2nd Digit  5.6 cm          Objective measurements completed on examination: See above findings.       [place] Adult PT Treatment/Exercise - 11/11/17 0001      Manual Therapy   Manual Therapy  Manual Lymphatic Drainage (MLD)    Manual therapy comments  Also issued tg soft stockinette for comfort for her left arm.    Manual Lymphatic Drainage (MLD)  To left UE in supine: short neck, left inguinal nodes, right axillary nodes, anterior inter-axillary and left axillo-inguinal pathways; left UE from wrist to shoulder redirecting along pathways.             PT Education - 11/11/17 1049    Education provided  Yes    Education Details  Lymphedema - treatment, causes, maintenance    Person(s) Educated  Patient    Methods  Explanation    Comprehension  Verbalized understanding       PT Short Term Goals - 11/11/17 1101      PT SHORT TERM  GOAL #1   Title  Patient will be able to verbalize understanding of the treatment for lymphedema and ways to reduce her risk of exaccerbation of swelling.    Baseline  No knowledge    Time  4    Period  Weeks    Status  New      PT SHORT TERM GOAL #2   Title  Reduce left arm circumference at 10 cm proximal to her olecranon to </= 28 cm.    Baseline  Left arm at 10 cm proximal to her olecranon is currently 29.7 which is 5.8 cm larger than her right arm.    Time  4    Period  Weeks    Status  New      PT SHORT TERM GOAL #3   Title  Reduce left arm circumference at 15 cm proximal to her ulnar styloid process to </= 24 cm.    Baseline  Left arm at 15 cm proximal to her ulnar styloid is currently 25.9 cm which is 4.7 cm larger than her right arm.    Time  4    Period  Weeks    Status  New        PT Long Term Goals - 11/11/17 1105      PT LONG TERM GOAL #1   Title  Reduce left arm circumference at 10 cm proximal to her olecranon to </= 25 cm.    Baseline  Left arm at 10 cm proximal to her olecranon is currently 29.7 which is 5.8 cm larger than her right arm.    Time  8    Period  Weeks    Status  New      PT LONG TERM GOAL #2   Title   Reduce left arm circumference at 15 cm proximal to her ulnar styloid process to </= 23 cm.    Baseline  Left arm at 15 cm proximal to her ulnar styloid is currently 25.9 cm which is 4.7 cm larger than her right arm.    Time  8    Period  Weeks    Status  New      PT LONG TERM GOAL #3   Title  Patient will be able to verbalize understanding of where and how to be fitted for a compression sleeve and glove.    Baseline  No knowledge    Time  8    Period  Weeks    Status  New      PT LONG TERM GOAL #4   Title  Patient will demonstrate proper technique for performing compression bandaging and self manual lymph drainage to continue treatment into the Maintenance Phase.    Baseline  No knowledge    Time  8    Period  Weeks    Status  New             Plan - 11/11/17 1051    Clinical Impression Statement  Patient is a very pleasant woman s/p left mastectomy and ALND 08/21/15. She has metastatic breast cancer in her bones and brain but appears to be doing quite well. Her left arm began swelling last weekend. She had a doppler study done which showed no sign of DVT so has been diagnosed with lymphedema. She has tenderness in mostly her forearm and will benefit from complete decongestive therapy. Unfortunately her insurance has limitations which require unusual frequency and duration for our typical course of lymphedema treatment. She will have to  begin treatment with 3 visits in the first 30 days nad will then be permitted (authorization pending) to attend therapy with the recommended normal course of 3x/week for 4 weeks.    History and Personal Factors relevant to plan of care:  Metastatic breast cancer; previous drug and alcohol abuser; bipolar disorder    Clinical Presentation  Evolving    Clinical Presentation due to:  Lympehdema onset is new; results of recent scans to determine disease progression are not yet known    Clinical Decision Making  Moderate    Rehab Potential  Excellent     Clinical Impairments Affecting Rehab Potential  None    PT Frequency  -- 1x/week for 3 weeks and then 3x/week for 4 weeks    PT Duration  8 weeks Beginning 12/08/17    PT Treatment/Interventions  ADLs/Self Care Home Management;Therapeutic exercise;Passive range of motion;Manual lymph drainage;Compression bandaging;Manual techniques;Therapeutic activities;DME Instruction    PT Next Visit Plan  Instruct with compression bandaging so she can do this at home while awaiting the normal frequency / duration needed for complete decongestive therapy.    Consulted and Agree with Plan of Care  Patient       Patient will benefit from skilled therapeutic intervention in order to improve the following deficits and impairments:  Decreased knowledge of use of DME, Pain, Postural dysfunction, Decreased scar mobility, Decreased range of motion, Impaired UE functional use, Increased edema  Visit Diagnosis: Postmastectomy lymphedema - Plan: PT plan of care cert/re-cert  Abnormal posture - Plan: PT plan of care cert/re-cert  Stiffness of left shoulder, not elsewhere classified - Plan: PT plan of care cert/re-cert  Pain in left arm - Plan: PT plan of care cert/re-cert     Problem List Patient Active Problem List   Diagnosis Date Noted  . Edema 11/09/2017  . Headache 05/21/2017  . Lithium overdose 04/26/2017  . Acute encephalopathy 03/16/2017  . Acute lower UTI   . Hypokalemia 02/18/2017  . Gait abnormality 01/06/2017  . Bipolar I disorder, most recent episode depressed ([place]) 12/08/2016  . Adjustment disorder with anxiety 12/06/2016  . Delirium due to another medical condition 12/03/2016  . Confusion 12/03/2016  . Diarrhea 12/03/2016  . Metastatic breast cancer ([place])   . Cerumen impaction 11/25/2016  . Otitis media 11/06/2016  . Brain metastasis ([place]) 07/27/2016  . Iron deficiency anemia 06/26/2016  . Bone metastases ([place]) 06/03/2016  . Primary cancer of lower-inner quadrant of left female breast  ([place]) 06/01/2016  . Pap smear for cervical cancer screening 03/28/2015  . Current smoker 03/28/2015  . Healthcare maintenance 03/28/2015  . Seasonal allergies 03/28/2015  . Anxiety state 02/28/2015  . Fibromyalgia 02/28/2015  . Family history of diabetes mellitus 02/28/2015  . H/O alcohol abuse     Annia Friendly, [place] 11/11/17 11:15 AM  [place], [place], [address] Phone: [phone]   Fax:  [phone]  Name: [name] MRN: [id number] Date of Birth: 05-26-1961

## 2017-11-11 NOTE — Progress Notes (Signed)
Called [facility] and gave her the results of the CT scan which are favorable.  I think the edema she is experiencing is due to the prior surgery.  She will need a compression sleeve.  She is going to be starting physical therapy in January.

## 2017-11-12 ENCOUNTER — Telehealth: Payer: Self-pay

## 2017-11-12 NOTE — Telephone Encounter (Signed)
Lymphedema sleeve prescription mailed to pt per her request.

## 2017-11-16 ENCOUNTER — Ambulatory Visit: Payer: Self-pay | Admitting: Urology

## 2017-11-16 ENCOUNTER — Ambulatory Visit: Payer: Self-pay | Admitting: Internal Medicine

## 2017-11-22 ENCOUNTER — Ambulatory Visit: Payer: Self-pay

## 2017-11-25 ENCOUNTER — Other Ambulatory Visit: Payer: Self-pay | Admitting: *Deleted

## 2017-11-25 MED ORDER — CYCLOBENZAPRINE HCL 5 MG PO TABS: 5.0000 mg | ORAL_TABLET | Freq: Two times a day (BID) | ORAL | 0 refills | Status: DC | PRN

## 2017-11-29 ENCOUNTER — Telehealth: Payer: Self-pay | Admitting: *Deleted

## 2017-11-29 ENCOUNTER — Other Ambulatory Visit: Payer: Self-pay | Admitting: Oncology

## 2017-11-29 NOTE — Telephone Encounter (Signed)
Received voicemail from Triage line from patient complaining of severe neck shoulder pain that she is unable to control with Goodies/Motrin.  She requests refill on Dosepak and Fexeril.  Per Dr. Mickeal Skinner if issue still present patient will need to schedule appt to be seen by him.  Patient needs to keep other appointment for 12/13/17 post MRI as is.    Attempted to call and schedule appt with patient but no voicemail or pick up.

## 2017-12-04 ENCOUNTER — Encounter (HOSPITAL_COMMUNITY): Payer: Self-pay | Admitting: *Deleted

## 2017-12-04 NOTE — Progress Notes (Signed)
Denies chest pain, shortness of breath. Reports difficult IV start

## 2017-12-06 ENCOUNTER — Telehealth: Payer: Self-pay | Admitting: *Deleted

## 2017-12-06 ENCOUNTER — Encounter: Payer: Self-pay | Admitting: Physical Therapy

## 2017-12-06 ENCOUNTER — Ambulatory Visit: Payer: Medicaid Other | Attending: Oncology | Admitting: Physical Therapy

## 2017-12-06 DIAGNOSIS — M25612 Stiffness of left shoulder, not elsewhere classified: Secondary | ICD-10-CM | POA: Diagnosis present

## 2017-12-06 DIAGNOSIS — R293 Abnormal posture: Secondary | ICD-10-CM | POA: Insufficient documentation

## 2017-12-06 DIAGNOSIS — M79602 Pain in left arm: Secondary | ICD-10-CM | POA: Diagnosis present

## 2017-12-06 DIAGNOSIS — I972 Postmastectomy lymphedema syndrome: Secondary | ICD-10-CM | POA: Insufficient documentation

## 2017-12-06 MED ORDER — CEPHALEXIN 500 MG PO CAPS: 500.0000 mg | ORAL_CAPSULE | Freq: Two times a day (BID) | ORAL | 14 refills | Status: DC

## 2017-12-06 NOTE — Therapy (Signed)
[name], [place], [address] Phone: [phone]   Fax:  [phone]  Physical Therapy Treatment  Patient Details  Name: [name] MRN: [id number] Date of Birth: Jun 25, 1961 Referring Provider: [name], PA-C   Encounter Date: 12/06/2017  PT End of Session - 12/06/17 1501    Visit Number  2    Number of Visits  16    Date for PT Re-Evaluation  01/26/18    PT Start Time  9357    PT Stop Time  1502    PT Time Calculation (min)  26 min    Activity Tolerance  Treatment limited secondary to medical complications (Comment)    Behavior During Therapy  [facility] for tasks assessed/performed       Past Medical History:  Diagnosis Date  . Alcohol abuse   . Anemia    during chemo  . Anxiety    At age 82  . Arthritis Dx 2010  . Bipolar disorder ([place])   . Cancer ([place])    breast mets to brain  . Chronic pain   . Complication of anesthesia   . Depression   . Family history of adverse reaction to anesthesia    parents had PONV  . Fibromyalgia Dx 2005  . GERD (gastroesophageal reflux disease)   . Headache    hx  migraines  . Lymphedema of left arm   . Opiate dependence ([place])   . PONV (postoperative nausea and vomiting)   . Port-A-Cath in place   . PTSD (post-traumatic stress disorder)     Past Surgical History:  Procedure Laterality Date  . APPLICATION OF CRANIAL NAVIGATION N/A 08/14/2016   Procedure: APPLICATION OF CRANIAL NAVIGATION;  Surgeon: Erline Levine, MD;  Location: [place] [facility];  Service: Neurosurgery;  Laterality: N/A;  . BREAST RECONSTRUCTION Left    with silicone implant  . CRANIOTOMY N/A 08/14/2016   Procedure: CRANIOTOMY TUMOR EXCISION WITH Lucky Rathke;  Surgeon: Erline Levine, MD;  Location: [place] [place] [facility];  Service: Neurosurgery;  Laterality: N/A;  . FIBULA FRACTURE SURGERY Left   . MASTECTOMY Left   . RADIOLOGY WITH ANESTHESIA N/A 07/23/2016   Procedure: MRI OF BRAIN WITH AND WITHOUT;   Surgeon: Medication Radiologist, MD;  Location: [facility];  Service: Radiology;  Laterality: N/A;  . RADIOLOGY WITH ANESTHESIA N/A 09/08/2016   Procedure: MRI OF BRAIN WITH AND WITHOUT CONTRAST;  Surgeon: Medication Radiologist, MD;  Location: [place];  Service: Radiology;  Laterality: N/A;  . RADIOLOGY WITH ANESTHESIA N/A 12/10/2016   Procedure: MRI OF BRAIN WITH AND WITHOUT;  Surgeon: Medication Radiologist, MD;  Location: [facility];  Service: Radiology;  Laterality: N/A;  . RADIOLOGY WITH ANESTHESIA N/A 03/02/2017   Procedure: MRI of BRAIN W and W/OUT CONTRAST;  Surgeon: Medication Radiologist, MD;  Location: [facility];  Service: Radiology;  Laterality: N/A;  . RADIOLOGY WITH ANESTHESIA N/A 07/29/2017   Procedure: RADIOLOGY WITH ANESTHESIA MRI OF BRAIN WITH AND WITHOUT CONTRAST;  Surgeon: Radiologist, Medication, MD;  Location: [facility];  Service: Radiology;  Laterality: N/A;  . right power port placement Right     There were no vitals filed for this visit.  Subjective Assessment - 12/06/17 1438    Subjective  My left arm is much worse. I can not wear the sleeve because it is so painful because it is so swollen.     Pertinent History  Left mastectomy and sentinel node biopsy 08/21/15 and then axillary dissection following that which showed 14/15  positive nodes. Then chemotherapy and radiation after breast reconstruction. Brain tumor removed 08/14/16 which was metastatic disease followed by full brain radiation. Surgery and chemo were done in [place]. 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 Currently she continues with Xgeva and Herceptin and reports her disease is stable.    Patient Stated Goals  reduce arm swelling    Currently in Pain?  Yes    Pain Score  5     Pain Location  Arm    Pain Orientation  Left    Pain Descriptors / Indicators  Aching;Tightness            LYMPHEDEMA/ONCOLOGY QUESTIONNAIRE - 12/06/17 1445      Left Upper  Extremity Lymphedema   At Axilla   34 cm    15 cm Proximal to Olecranon Process  31 cm    10 cm Proximal to Olecranon Process  30.8 cm    Olecranon Process  26 cm    15 cm Proximal to Ulnar Styloid Process  25.2 cm    10 cm Proximal to Ulnar Styloid Process  21 cm    Just Proximal to Ulnar Styloid Process  16 cm    Across Hand at [employer]  18.1 cm    At [employer] of 2nd Digit  5.7 cm               [facility] Adult PT Treatment/Exercise - 12/06/17 0001      Manual Therapy   Manual therapy comments  circumferential measurements taken but pt not wrapped due to possible cellulitis             PT Education - 12/06/17 1506    Education provided  Yes    Education Details  signs and symptoms of cellultis, importance of not using any type of compression until pt has been on antibiotics for a period of time to prevent infection spread    Person(s) Educated  Patient    Methods  Explanation    Comprehension  Verbalized understanding       PT Short Term Goals - 11/11/17 1101      PT SHORT TERM GOAL #1   Title  Patient will be able to verbalize understanding of the treatment for lymphedema and ways to reduce her risk of exaccerbation of swelling.    Baseline  No knowledge    Time  4    Period  Weeks    Status  New      PT SHORT TERM GOAL #2   Title  Reduce left arm circumference at 10 cm proximal to her olecranon to </= 28 cm.    Baseline  Left arm at 10 cm proximal to her olecranon is currently 29.7 which is 5.8 cm larger than her right arm.    Time  4    Period  Weeks    Status  New      PT SHORT TERM GOAL #3   Title  Reduce left arm circumference at 15 cm proximal to her ulnar styloid process to </= 24 cm.    Baseline  Left arm at 15 cm proximal to her ulnar styloid is currently 25.9 cm which is 4.7 cm larger than her right arm.    Time  4    Period  Weeks    Status  New        PT Long Term Goals - 11/11/17 1105  PT LONG TERM GOAL #1   Title  Reduce left  arm circumference at 10 cm proximal to her olecranon to </= 25 cm.    Baseline  Left arm at 10 cm proximal to her olecranon is currently 29.7 which is 5.8 cm larger than her right arm.    Time  8    Period  Weeks    Status  New      PT LONG TERM GOAL #2   Title  Reduce left arm circumference at 15 cm proximal to her ulnar styloid process to </= 23 cm.    Baseline  Left arm at 15 cm proximal to her ulnar styloid is currently 25.9 cm which is 4.7 cm larger than her right arm.    Time  8    Period  Weeks    Status  New      PT LONG TERM GOAL #3   Title  Patient will be able to verbalize understanding of where and how to be fitted for a compression sleeve and glove.    Baseline  No knowledge    Time  8    Period  Weeks    Status  New      PT LONG TERM GOAL #4   Title  Patient will demonstrate proper technique for performing compression bandaging and self manual lymph drainage to continue treatment into the Maintenance Phase.    Baseline  No knowledge    Time  8    Period  Weeks    Status  New            Plan - 12/06/17 1450    Clinical Impression Statement  Pt arrived to PT today with increased redness at forearm. Her hand is more swollen today and her axilla is 4 cm larger than time of evaluation. Pt states she just does not feel well. Her skin is very taut. She is having a lot of pain with her LUE. Contacted her physicians nurse who is calling in an antibiotic for celluliltis.     Rehab Potential  Excellent    Clinical Impairments Affecting Rehab Potential  None    PT Frequency  -- 1x/wk for 3 wks, then 3x/wk for 4 wks    PT Duration  8 weeks    PT Treatment/Interventions  ADLs/Self Care Home Management;Therapeutic exercise;Passive range of motion;Manual lymph drainage;Compression bandaging;Manual techniques;Therapeutic activities;DME Instruction    PT Next Visit Plan  Be sure pt has been on antibiotic for 24 hrs, Instruct with compression bandaging so she can do this at home  while awaiting the normal frequency / duration needed for complete decongestive therapy.    Consulted and Agree with Plan of Care  Patient       Patient will benefit from skilled therapeutic intervention in order to improve the following deficits and impairments:  Decreased knowledge of use of DME, Pain, Postural dysfunction, Decreased scar mobility, Decreased range of motion, Impaired UE functional use, Increased edema  Visit Diagnosis: Postmastectomy lymphedema     Problem List Patient Active Problem List   Diagnosis Date Noted  . Edema 11/09/2017  . Headache 05/21/2017  . Lithium overdose 04/26/2017  . Acute encephalopathy 03/16/2017  . Acute lower UTI   . Hypokalemia 02/18/2017  . Gait abnormality 01/06/2017  . Bipolar I disorder, most recent episode depressed ([place]) 12/08/2016  . Adjustment disorder with anxiety 12/06/2016  . Delirium due to another medical condition 12/03/2016  . Confusion 12/03/2016  . Diarrhea 12/03/2016  .  Metastatic breast cancer ([place])   . Cerumen impaction 11/25/2016  . Otitis media 11/06/2016  . Brain metastasis ([place]) 07/27/2016  . Iron deficiency anemia 06/26/2016  . Bone metastases ([place]) 06/03/2016  . Primary cancer of lower-inner quadrant of left female breast ([place]) 06/01/2016  . Pap smear for cervical cancer screening 03/28/2015  . Current smoker 03/28/2015  . Healthcare maintenance 03/28/2015  . Seasonal allergies 03/28/2015  . Anxiety state 02/28/2015  . Fibromyalgia 02/28/2015  . Family history of diabetes mellitus 02/28/2015  . H/O alcohol abuse     Media Information   Document Information   Photos    12/06/2017 14:42  Attached To:  Bella Kennedy  Source Information   Toribio Harbour, PT  [facility]     Allyson Sabal [facility] 12/06/2017, 3:08 PM  [place], [place], [address] Phone: [phone]   Fax:  [phone]  Name: Azure Budnick MRN: [id number] Date of Birth: 1961-04-21  Manus Gunning, PT 12/06/17 3:08 PM

## 2017-12-07 ENCOUNTER — Encounter (HOSPITAL_COMMUNITY): Payer: Self-pay

## 2017-12-07 ENCOUNTER — Ambulatory Visit (HOSPITAL_COMMUNITY)
Admission: RE | Admit: 2017-12-07 | Discharge: 2017-12-07 | Disposition: A | Discharge status: Home / Self Care | Payer: Medicaid Other | Source: Ambulatory Visit | Attending: Radiation Oncology | Admitting: Radiation Oncology

## 2017-12-07 ENCOUNTER — Encounter (HOSPITAL_COMMUNITY)
Admission: RE | Disposition: A | Discharge status: Home / Self Care | Payer: Self-pay | Source: Ambulatory Visit | Attending: Radiation Oncology

## 2017-12-07 ENCOUNTER — Inpatient Hospital Stay: Payer: Medicaid Other | Attending: Medical | Admitting: Medical

## 2017-12-07 ENCOUNTER — Other Ambulatory Visit: Payer: Self-pay | Admitting: Medical

## 2017-12-07 ENCOUNTER — Ambulatory Visit: Payer: Self-pay | Admitting: Hematology and Oncology

## 2017-12-07 ENCOUNTER — Telehealth: Payer: Self-pay | Admitting: *Deleted

## 2017-12-07 ENCOUNTER — Inpatient Hospital Stay: Payer: Medicaid Other

## 2017-12-07 ENCOUNTER — Ambulatory Visit (HOSPITAL_COMMUNITY): Payer: Medicaid Other | Admitting: Certified Registered Nurse Anesthetist

## 2017-12-07 ENCOUNTER — Other Ambulatory Visit: Payer: Self-pay

## 2017-12-07 DIAGNOSIS — Z79811 Long term (current) use of aromatase inhibitors: Secondary | ICD-10-CM | POA: Insufficient documentation

## 2017-12-07 DIAGNOSIS — M797 Fibromyalgia: Secondary | ICD-10-CM | POA: Insufficient documentation

## 2017-12-07 DIAGNOSIS — R911 Solitary pulmonary nodule: Secondary | ICD-10-CM | POA: Insufficient documentation

## 2017-12-07 DIAGNOSIS — Z170 Estrogen receptor positive status [ER+]: Secondary | ICD-10-CM | POA: Diagnosis not present

## 2017-12-07 DIAGNOSIS — I890 Lymphedema, not elsewhere classified: Secondary | ICD-10-CM | POA: Diagnosis not present

## 2017-12-07 DIAGNOSIS — L03114 Cellulitis of left upper limb: Secondary | ICD-10-CM | POA: Insufficient documentation

## 2017-12-07 DIAGNOSIS — C7951 Secondary malignant neoplasm of bone: Secondary | ICD-10-CM | POA: Insufficient documentation

## 2017-12-07 DIAGNOSIS — Z853 Personal history of malignant neoplasm of breast: Secondary | ICD-10-CM | POA: Insufficient documentation

## 2017-12-07 DIAGNOSIS — Z5111 Encounter for antineoplastic chemotherapy: Secondary | ICD-10-CM | POA: Diagnosis not present

## 2017-12-07 DIAGNOSIS — F319 Bipolar disorder, unspecified: Secondary | ICD-10-CM | POA: Insufficient documentation

## 2017-12-07 DIAGNOSIS — Z808 Family history of malignant neoplasm of other organs or systems: Secondary | ICD-10-CM | POA: Insufficient documentation

## 2017-12-07 DIAGNOSIS — C50312 Malignant neoplasm of lower-inner quadrant of left female breast: Secondary | ICD-10-CM

## 2017-12-07 DIAGNOSIS — R278 Other lack of coordination: Secondary | ICD-10-CM | POA: Insufficient documentation

## 2017-12-07 DIAGNOSIS — Z8042 Family history of malignant neoplasm of prostate: Secondary | ICD-10-CM | POA: Diagnosis not present

## 2017-12-07 DIAGNOSIS — Z79899 Other long term (current) drug therapy: Secondary | ICD-10-CM | POA: Diagnosis not present

## 2017-12-07 DIAGNOSIS — Z801 Family history of malignant neoplasm of trachea, bronchus and lung: Secondary | ICD-10-CM | POA: Diagnosis not present

## 2017-12-07 DIAGNOSIS — C7931 Secondary malignant neoplasm of brain: Secondary | ICD-10-CM | POA: Diagnosis present

## 2017-12-07 DIAGNOSIS — F419 Anxiety disorder, unspecified: Secondary | ICD-10-CM | POA: Insufficient documentation

## 2017-12-07 DIAGNOSIS — E041 Nontoxic single thyroid nodule: Secondary | ICD-10-CM | POA: Diagnosis not present

## 2017-12-07 DIAGNOSIS — F17200 Nicotine dependence, unspecified, uncomplicated: Secondary | ICD-10-CM | POA: Diagnosis not present

## 2017-12-07 DIAGNOSIS — F329 Major depressive disorder, single episode, unspecified: Secondary | ICD-10-CM | POA: Diagnosis not present

## 2017-12-07 HISTORY — PX: RADIOLOGY WITH ANESTHESIA: SHX6223

## 2017-12-07 LAB — COMPREHENSIVE METABOLIC PANEL
ALT: 32 U/L (ref 14–54)
ALT: 37 U/L (ref 0–55)
AST: 78 U/L — ABNORMAL HIGH (ref 15–41)
AST: 36 U/L — ABNORMAL HIGH (ref 5–34)
Albumin: 3.8 g/dL (ref 3.5–5.0)
Albumin: 4 g/dL (ref 3.5–5.0)
Alkaline Phosphatase: 34 U/L — ABNORMAL LOW (ref 38–126)
Alkaline Phosphatase: 41 U/L (ref 40–150)
Anion gap: 13 (ref 5–15)
Anion gap: 9 (ref 3–11)
BUN: 14 mg/dL (ref 6–20)
BUN: 13 mg/dL (ref 7–26)
CO2: 24 mmol/L (ref 22–32)
CO2: 29 mmol/L (ref 22–29)
Calcium: 9.3 mg/dL (ref 8.9–10.3)
Calcium: 9.5 mg/dL (ref 8.4–10.4)
Chloride: 102 mmol/L (ref 101–111)
Chloride: 103 mmol/L (ref 98–109)
Creatinine, Ser: 0.95 mg/dL (ref 0.44–1.00)
Creatinine, Ser: 1.09 mg/dL (ref 0.60–1.10)
GFR calc Af Amer: >60 mL/min (ref >60)
GFR calc Af Amer: >60 mL/min (ref >60)
GFR calc non Af Amer: >60 mL/min (ref >60)
GFR calc non Af Amer: 56 mL/min — ABNORMAL LOW (ref >60)
Glucose, Bld: 78 mg/dL (ref 65–99)
Glucose, Bld: 145 mg/dL — ABNORMAL HIGH (ref 70–140)
Potassium: 6 mmol/L — ABNORMAL HIGH (ref 3.5–5.1)
Potassium: 3.9 mmol/L (ref 3.3–4.7)
Sodium: 139 mmol/L (ref 135–145)
Sodium: 141 mmol/L (ref 136–145)
Total Bilirubin: 1.6 mg/dL — ABNORMAL HIGH (ref 0.3–1.2)
Total Bilirubin: <0.2 mg/dL — ABNORMAL LOW (ref 0.2–1.2)
Total Protein: 5.8 g/dL — ABNORMAL LOW (ref 6.5–8.1)
Total Protein: 6.8 g/dL (ref 6.4–8.3)

## 2017-12-07 LAB — CBC
HCT: 41.7 % (ref 36.0–46.0)
Hemoglobin: 14 g/dL (ref 12.0–15.0)
MCH: 33.2 pg (ref 26.0–34.0)
MCHC: 33.6 g/dL (ref 30.0–36.0)
MCV: 98.8 fL (ref 78.0–100.0)
Platelets: 226 10*3/uL (ref 150–400)
RBC: 4.22 MIL/uL (ref 3.87–5.11)
RDW: 13.2 % (ref 11.5–15.5)
WBC: 3.9 10*3/uL — ABNORMAL LOW (ref 4.0–10.5)

## 2017-12-07 SURGERY — MRI WITH ANESTHESIA
Anesthesia: General

## 2017-12-07 MED ORDER — DEXAMETHASONE SODIUM PHOSPHATE 10 MG/ML IJ SOLN
INTRAMUSCULAR | Status: DC | PRN
  Administered 2017-12-07: 4 mg via INTRAVENOUS

## 2017-12-07 MED ORDER — ONDANSETRON HCL 4 MG/2ML IJ SOLN
INTRAMUSCULAR | Status: DC | PRN
  Administered 2017-12-07: 4 mg via INTRAVENOUS

## 2017-12-07 MED ORDER — LIDOCAINE HCL (CARDIAC) 20 MG/ML IV SOLN
INTRAVENOUS | Status: DC | PRN
  Administered 2017-12-07: 60 mg via INTRAVENOUS

## 2017-12-07 MED ORDER — PHENYLEPHRINE HCL 10 MG/ML IJ SOLN
INTRAMUSCULAR | Status: DC | PRN
  Administered 2017-12-07: 80 ug via INTRAVENOUS
  Administered 2017-12-07: 120 ug via INTRAVENOUS
  Administered 2017-12-07: 80 ug via INTRAVENOUS
  Administered 2017-12-07: 120 ug via INTRAVENOUS

## 2017-12-07 MED ORDER — LACTATED RINGERS IV SOLN
INTRAVENOUS | Status: DC
  Administered 2017-12-07 (×2): via INTRAVENOUS

## 2017-12-07 MED ORDER — GADOBENATE DIMEGLUMINE 529 MG/ML IV SOLN
13.0000 mL | Freq: Once | INTRAVENOUS | Status: AC | PRN
  Administered 2017-12-07: 13 mL via INTRAVENOUS

## 2017-12-07 MED ORDER — MIDAZOLAM HCL 5 MG/5ML IJ SOLN
INTRAMUSCULAR | Status: DC | PRN
  Administered 2017-12-07: 2 mg via INTRAVENOUS

## 2017-12-07 MED ORDER — PROPOFOL 10 MG/ML IV BOLUS
INTRAVENOUS | Status: DC | PRN
  Administered 2017-12-07: 110 mg via INTRAVENOUS

## 2017-12-07 MED ORDER — ROCURONIUM BROMIDE 100 MG/10ML IV SOLN
INTRAVENOUS | Status: DC | PRN
  Administered 2017-12-07: 30 mg via INTRAVENOUS

## 2017-12-07 MED ORDER — MIDAZOLAM HCL 2 MG/2ML IJ SOLN
1.0000 mg | Freq: Once | INTRAMUSCULAR | Status: AC
  Administered 2017-12-07: 1 mg via INTRAVENOUS
  Filled 2017-12-07: qty 2
  Filled 2017-12-07: qty 1

## 2017-12-07 MED ORDER — FENTANYL CITRATE (PF) 100 MCG/2ML IJ SOLN
INTRAMUSCULAR | Status: DC | PRN
  Administered 2017-12-07: 100 ug via INTRAVENOUS

## 2017-12-07 NOTE — Telephone Encounter (Signed)
Pt came in to lobby post MRI ( done at [facility] ) per call yesterday and onset of lymphedema - this RN requested STAT appointment with [facility].  See note from 12/06/2017 for more information.

## 2017-12-07 NOTE — Progress Notes (Signed)
Pt left without being seen today. Per Shelle Iron, RN-pt's ride was here and pt couldn't wait  To be seen.

## 2017-12-07 NOTE — H&P (Signed)
Anesthesia H&P Update: History and Physical Exam reviewed; patient is OK for planned anesthetic and procedure. ? ?

## 2017-12-07 NOTE — Anesthesia Postprocedure Evaluation (Signed)
Anesthesia Post Note  Patient: [name]  Procedure(s) Performed: MRI WITH ANESTHESIA OF BRAIN WITH AND WITHOUT CONTRAST (N/A )     Patient location during evaluation: PACU Anesthesia Type: General Level of consciousness: awake and alert Pain management: pain level controlled Vital Signs Assessment: post-procedure vital signs reviewed and stable Respiratory status: spontaneous breathing, nonlabored ventilation and respiratory function stable Cardiovascular status: blood pressure returned to baseline and stable Postop Assessment: no apparent nausea or vomiting Anesthetic complications: no    Last Vitals:  Vitals:   12/07/17 1215 12/07/17 1230  BP: 104/83 118/74  Pulse: 91 89  Resp:  18  Temp:    SpO2: 99% 100%    Last Pain:  Vitals:   12/07/17 1230  TempSrc:   PainSc: 0-No pain                 Aubrianne Molyneux,W. EDMOND

## 2017-12-07 NOTE — Anesthesia Procedure Notes (Signed)
Procedure Name: Intubation Date/Time: 12/07/2017 10:35 AM Performed by: Inda Coke, CRNA Pre-anesthesia Checklist: Patient identified, Emergency Drugs available, Suction available and Patient being monitored Patient Re-evaluated:Patient Re-evaluated prior to induction Oxygen Delivery Method: Circle System Utilized Preoxygenation: Pre-oxygenation with 100% oxygen Induction Type: IV induction Ventilation: Mask ventilation without difficulty Laryngoscope Size: Mac and 3 Grade View: Grade II Tube type: Oral Tube size: 7.0 mm Number of attempts: 1 Airway Equipment and Method: Stylet and Oral airway Placement Confirmation: ETT inserted through vocal cords under direct vision,  positive ETCO2 and breath sounds checked- equal and bilateral Secured at: 21 cm Tube secured with: Tape Dental Injury: Teeth and Oropharynx as per pre-operative assessment

## 2017-12-07 NOTE — Transfer of Care (Signed)
Immediate Anesthesia Transfer of Care Note  Patient: [name]  Procedure(s) Performed: MRI WITH ANESTHESIA OF BRAIN WITH AND WITHOUT CONTRAST (N/A )  Patient Location: PACU  Anesthesia Type:General  Level of Consciousness: awake, alert  and oriented  Airway & Oxygen Therapy: Patient Spontanous Breathing and Patient connected to nasal cannula oxygen  Post-op Assessment: Report given to RN, Post -op Vital signs reviewed and stable and Patient moving all extremities X 4  Post vital signs: Reviewed and stable  Last Vitals:  Vitals:   12/07/17 0820 12/07/17 1200  BP: 117/69 114/77  Pulse: 84 100  Resp: 15 18  Temp:  37 C  SpO2: 98% 97%    Last Pain:  Vitals:   12/07/17 1200  TempSrc:   PainSc: 0-No pain         Complications: No apparent anesthesia complications

## 2017-12-07 NOTE — Anesthesia Preprocedure Evaluation (Addendum)
Anesthesia Evaluation  Patient identified by MRN, date of birth, ID band Patient awake    Reviewed: Allergy & Precautions, H&P , NPO status , Patient's Chart, lab work & pertinent test results  History of Anesthesia Complications (+) PONV  Airway Mallampati: II  TM Distance: >3 FB Neck ROM: Full    Dental no notable dental hx. (+) Teeth Intact, Dental Advisory Given   Pulmonary Current Smoker,    Pulmonary exam normal breath sounds clear to auscultation       Cardiovascular negative cardio ROS   Rhythm:Regular Rate:Normal     Neuro/Psych  Headaches, Anxiety Depression Bipolar Disorder negative psych ROS   GI/Hepatic negative GI ROS, Neg liver ROS,   Endo/Other  negative endocrine ROS  Renal/GU negative Renal ROS  negative genitourinary   Musculoskeletal  (+) Arthritis , Fibromyalgia -  Abdominal   Peds  Hematology negative hematology ROS (+)   Anesthesia Other Findings   Reproductive/Obstetrics negative OB ROS                            Anesthesia Physical Anesthesia Plan  ASA: II  Anesthesia Plan: General   Post-op Pain Management:    Induction: Intravenous  PONV Risk Score and Plan: 3 and Ondansetron, Dexamethasone and Midazolam  Airway Management Planned: Oral ETT  Additional Equipment:   Intra-op Plan:   Post-operative Plan: Extubation in OR  Informed Consent: I have reviewed the patients History and Physical, chart, labs and discussed the procedure including the risks, benefits and alternatives for the proposed anesthesia with the patient or authorized representative who has indicated his/her understanding and acceptance.   Dental advisory given  Plan Discussed with: CRNA  Anesthesia Plan Comments:         Anesthesia Quick Evaluation

## 2017-12-07 NOTE — Progress Notes (Signed)
Erroneous encounter. Left without being seen.

## 2017-12-08 ENCOUNTER — Encounter: Payer: Self-pay | Admitting: Physical Therapy

## 2017-12-08 ENCOUNTER — Encounter (HOSPITAL_COMMUNITY): Payer: Self-pay | Admitting: Radiology

## 2017-12-08 ENCOUNTER — Inpatient Hospital Stay (HOSPITAL_BASED_OUTPATIENT_CLINIC_OR_DEPARTMENT_OTHER): Payer: Medicaid Other | Admitting: Medical

## 2017-12-08 VITALS — BP 124/69 | HR 102 | Temp 98.0°F | Resp 17 | Ht 65.0 in | Wt 143.2 lb

## 2017-12-08 DIAGNOSIS — Z5111 Encounter for antineoplastic chemotherapy: Secondary | ICD-10-CM | POA: Diagnosis not present

## 2017-12-08 DIAGNOSIS — I890 Lymphedema, not elsewhere classified: Secondary | ICD-10-CM

## 2017-12-08 DIAGNOSIS — L03114 Cellulitis of left upper limb: Secondary | ICD-10-CM

## 2017-12-08 NOTE — Progress Notes (Signed)
[facility] Progress Note   [name] [id number] Jan 30, 1961 57 y.o.  [name] is managed by Dr. [name]  Actively treated with chemotherapy: yes  Current Therapy: Herceptin and Delton See  Last Treated: 10/26/2017  Assessment: Plan:    Lymphedema of left arm  Cellulitis of left upper extremity   Lymphedema of the left arm: Continue to follow-up with the therapy lymphedema clinic through physical therapy.  Cellulitis of the left upper extremity: Continue Keflex 500 mg p.o. twice daily as prescribed.  Return as needed.  We will contact the patient on Friday to ascertain if her lymphedema is improving.  I have asked her to return immediately should she have fevers, rigors, progressive lymphedema, or pain.  Please see After Visit Summary for patient specific instructions.  Future Appointments  Date Time Provider [place]  12/10/2017  8:45 AM Arnoldo Morale, MD [facility] None  12/13/2017 10:00 AM Ventura Sellers, MD [facility] None  12/15/2017  1:45 PM Jomarie Longs, PT [facility] None  12/17/2017  7:40 AM [facility] MM 2 [facility] GI-BREAST CE  12/20/2017 10:30 AM [facility] LAB 2 [facility] None  12/20/2017 11:00 AM Magrinat, Virgie Dad, MD [facility] None  12/20/2017 11:45 AM [facility] H31 [facility] None  12/22/2017  1:45 PM Jomarie Longs, PT [facility] None  12/31/2017  8:00 AM Jomarie Longs, PT [facility] None  01/03/2018  1:00 PM Jomarie Longs, PT [facility] None  01/05/2018  1:45 PM Jomarie Longs, PT [facility] None  01/07/2018  8:00 AM Jomarie Longs, PT [facility] None  01/10/2018  1:00 PM Jomarie Longs, PT [facility] None  01/12/2018  1:45 PM Jomarie Longs, PT [facility] None  01/14/2018  8:00 AM Jomarie Longs, PT [facility] None  01/17/2018  1:00 PM Jomarie Longs, PT [facility] None  01/19/2018  1:45 PM Jomarie Longs, PT [facility] None  01/21/2018  8:00 AM Jomarie Longs, PT [facility] None    No orders of the defined types were  placed in this encounter.      Subjective:   Patient ID:  [name] is a 57 y.o. (DOB [birth year]) female.  Chief Complaint:  Chief Complaint  Patient presents with  . 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 She was originally diagnosed on 06/19/2015 with a T2-3, NX invasive ductal carcinoma, grade 2, triple positive of the left breast lower inner quadrant biopsy. She is status post a left mastectomy and axillary lymph node dissection with immediate expander placement on 07/18/2015. Her pathology returned as an mpT4 pN2,stage IIIB invasive ductal carcinoma, grade 3, with negative margins. In October, 2016, a CT scan of the chest abdomen and pelvis returned showing metastatic lesions in the right scapula, left iliac crest, L4, and T spine. There were questionable liver cysts, with repeat CT scan on 03/02/2016 showing possible right upper lobe lung lesions and possibly increased liver lesions. A head CT from 07/08/2016 showed a cerebellar lesion, confirmed by MRI on 07/23/2016. [name] was taken for a craniotomy on 08/14/2016. Pathology confirmed metastatic deposit which was estrogen and progesterone receptor negative, and HER-2 amplified with a signals ratio of 7.16, with number per cell 13.25. She is currently treated with Rituximab and Xgeva.  She was last treated with cycle 21-day 1 on 10/26/2017.  She contacted her primary care provider on 11/09/2017 stating that she had developed left upper extremity edema without  shortness of breath, chest pain, fever, redness, or injuries.  A venous Doppler ultrasound was completed on the right and left upper extremity on 11/10/2017 showing no evidence of an obstruction or thrombus.  She has been seen by physical therapy and treated for lymphedema.  She has been given a compression sleeve and had been shown to manual lymphatic drainage.  She contacted our office  on Monday stating that she was having increased swelling in her left hand, warmth, and erythema.  She was prescribed Keflex 500 mg p.o. twice daily.  She presents to the office today for follow-up.  Medications: I have reviewed the patient's current medications.  Allergies:  Allergies  Allergen Reactions  . Demerol Itching and Nausea And Vomiting  . Erythromycin Rash    Past Medical History:  Diagnosis Date  . Alcohol abuse   . Anemia    during chemo  . Anxiety    At age 76  . Arthritis Dx 2010  . Bipolar disorder ([place])   . Cancer ([place])    breast mets to brain  . Chronic pain   . Complication of anesthesia   . Depression   . Family history of adverse reaction to anesthesia    parents had PONV  . Fibromyalgia Dx 2005  . GERD (gastroesophageal reflux disease)   . Headache    hx  migraines  . Lymphedema of left arm   . Opiate dependence ([place])   . PONV (postoperative nausea and vomiting)   . Port-A-Cath in place   . PTSD (post-traumatic stress disorder)     Past Surgical History:  Procedure Laterality Date  . APPLICATION OF CRANIAL NAVIGATION N/A 08/14/2016   Procedure: APPLICATION OF CRANIAL NAVIGATION;  Surgeon: Erline Levine, MD;  Location: [place] [facility];  Service: Neurosurgery;  Laterality: N/A;  . BREAST RECONSTRUCTION Left    with silicone implant  . CRANIOTOMY N/A 08/14/2016   Procedure: CRANIOTOMY TUMOR EXCISION WITH Lucky Rathke;  Surgeon: Erline Levine, MD;  Location: [place] [facility];  Service: Neurosurgery;  Laterality: N/A;  . FIBULA FRACTURE SURGERY Left   . MASTECTOMY Left   . RADIOLOGY WITH ANESTHESIA N/A 07/23/2016   Procedure: MRI OF BRAIN WITH AND WITHOUT;  Surgeon: Medication Radiologist, MD;  Location: [place];  Service: Radiology;  Laterality: N/A;  . RADIOLOGY WITH ANESTHESIA N/A 09/08/2016   Procedure: MRI OF BRAIN WITH AND WITHOUT CONTRAST;  Surgeon: Medication Radiologist, MD;  Location: [place];  Service: Radiology;  Laterality: N/A;  . RADIOLOGY WITH  ANESTHESIA N/A 12/10/2016   Procedure: MRI OF BRAIN WITH AND WITHOUT;  Surgeon: Medication Radiologist, MD;  Location: [place];  Service: Radiology;  Laterality: N/A;  . RADIOLOGY WITH ANESTHESIA N/A 03/02/2017   Procedure: MRI of BRAIN W and W/OUT CONTRAST;  Surgeon: Medication Radiologist, MD;  Location: [place];  Service: Radiology;  Laterality: N/A;  . RADIOLOGY WITH ANESTHESIA N/A 07/29/2017   Procedure: RADIOLOGY WITH ANESTHESIA MRI OF BRAIN WITH AND WITHOUT CONTRAST;  Surgeon: Radiologist, Medication, MD;  Location: [place];  Service: Radiology;  Laterality: N/A;  . right power port placement Right     Family History  Problem Relation Age of Onset  . Diabetes Mother   . Bipolar disorder Mother   . CAD Father     Social History   Socioeconomic History  . Marital status: Single    Spouse name: Not on file  . Number of children: Not on file  . Years of education: Not on file  . Highest education  level: Not on file  Social Needs  . Financial resource strain: Not on file  . Food insecurity - worry: Not on file  . Food insecurity - inability: Not on file  . Transportation needs - medical: Not on file  . Transportation needs - non-medical: Not on file  Occupational History  . Not on file  Tobacco Use  . Smoking status: Current Every Day Smoker    Packs/day: 1.50    Types: Cigarettes  . Smokeless tobacco: Never Used  Substance and Sexual Activity  . Alcohol use: No    Comment: no ETOH since 08/22/12  . Drug use: No    Comment: states she's in recovery program for 5 years  . Sexual activity: No    Birth control/protection: Other-see comments    Comment: ablation  Other Topics Concern  . Not on file  Social History Narrative  . Not on file    Past Medical History, Surgical history, Social history, and Family history were reviewed and updated as appropriate.   Please see review of systems for further details on the patient's review from today.   Review of Systems:  Review of  Systems  Constitutional: Negative for chills, diaphoresis and fever.  Skin: Positive for color change and wound (Small tear of the cuticle at the base of the left third fingernail.).       Left upper extremity edema    Objective:   Physical Exam:  BP 124/69 (BP Location: Left Arm, Patient Position: Sitting)   Pulse (!) 102   Temp 98 F (36.7 C) (Oral)   Resp 17   Ht 5' 5"  (1.651 m)   Wt 143 lb 3.2 oz (65 kg)   SpO2 100%   BMI 23.83 kg/m  ECOG: 0  Physical Exam  Constitutional: No distress.  HENT:  Head: Normocephalic and atraumatic.  Cardiovascular: Normal rate, regular rhythm and normal heart sounds. Exam reveals no gallop and no friction rub.  No murmur heard. Pulmonary/Chest: Effort normal and breath sounds normal. No respiratory distress. She has no wheezes. She has no rales.  Neurological: She is alert. Coordination normal.  Skin: Skin is warm and dry. She is not diaphoretic. There is erythema.  Lymphedema, erythema, and increased warmth over the left upper extremity.  There is a small tear along the cuticle at the base of the left third fingernail.  Psychiatric: She has a normal mood and affect. Her behavior is normal. Judgment and thought content normal.          Lab Review:     Component Value Date/Time   NA 141 12/07/2017 1404   NA 140 10/26/2017 1038   K 3.9 12/07/2017 1404   K 3.8 10/26/2017 1038   CL 103 12/07/2017 1404   CO2 29 12/07/2017 1404   CO2 24 10/26/2017 1038   GLUCOSE 145 (H) 12/07/2017 1404   GLUCOSE 158 (H) 10/26/2017 1038   BUN 13 12/07/2017 1404   BUN 12.0 10/26/2017 1038   CREATININE 1.09 12/07/2017 1404   CREATININE 0.8 10/26/2017 1038   CALCIUM 9.5 12/07/2017 1404   CALCIUM 8.7 10/26/2017 1038   PROT 6.8 12/07/2017 1404   PROT 6.1 (L) 10/26/2017 1038   ALBUMIN 4.0 12/07/2017 1404   ALBUMIN 3.7 10/26/2017 1038   AST 36 (H) 12/07/2017 1404   AST 35 (H) 10/26/2017 1038   ALT 37 12/07/2017 1404   ALT 48 10/26/2017 1038    ALKPHOS 41 12/07/2017 1404   ALKPHOS 34 (L) 10/26/2017 1038  BILITOT <0.2 (L) 12/07/2017 1404   BILITOT <0.22 10/26/2017 1038   GFRNONAA 56 (L) 12/07/2017 1404   GFRAA >60 12/07/2017 1404       Component Value Date/Time   WBC 3.9 (L) 12/07/2017 0759   RBC 4.22 12/07/2017 0759   HGB 14.0 12/07/2017 0759   HGB 12.1 10/26/2017 1038   HCT 41.7 12/07/2017 0759   HCT 35.7 10/26/2017 1038   PLT 226 12/07/2017 0759   PLT 167 10/26/2017 1038   MCV 98.8 12/07/2017 0759   MCV 98.4 10/26/2017 1038   MCH 33.2 12/07/2017 0759   MCHC 33.6 12/07/2017 0759   RDW 13.2 12/07/2017 0759   RDW 13.3 10/26/2017 1038   LYMPHSABS 0.5 (L) 10/26/2017 1038   MONOABS 0.1 10/26/2017 1038   EOSABS 0.0 10/26/2017 1038   BASOSABS 0.0 10/26/2017 1038   -------------------------------  Imaging from last 24 hours (if applicable):  Radiology interpretation: Ct Chest W Contrast  Result Date: 11/10/2017 CLINICAL DATA:  Left arm swelling. History of left breast cancer and previous left mastectomy. EXAM: CT CHEST WITH CONTRAST TECHNIQUE: Multidetector CT imaging of the chest was performed during intravenous contrast administration. CONTRAST:  48m ISOVUE-300 IOPAMIDOL (ISOVUE-300) INJECTION 61% COMPARISON:  07/02/2017.  03/18/2017. FINDINGS: Cardiovascular: The heart size is normal. No pericardial effusion. No thoracic aortic aneurysm. Right Port-A-Cath tip is positioned at the SVC/ RA junction. Left subclavian vein appears similar to prior without definite findings of obstruction or thrombus. There is some mild edema in the left axilla, not substantially changed in the interval. Mediastinum/Nodes: 9 mm short axis precarinal lymph node is new in the interval. No other mediastinal lymphadenopathy evident. There is no hilar lymphadenopathy. The esophagus has normal imaging features. There is no axillary lymphadenopathy. Small bilateral thyroid nodules are stable. Lungs/Pleura: 7 mm nodule in the posterior right upper lobe  (image 67 series 5) is probably atelectatic. There is dependent atelectasis in the lower lungs bilaterally. No focal airspace consolidation. No pulmonary edema or pleural effusion. Upper Abdomen: Innumerable tiny hypodensities in the liver parenchyma are similar to prior exam. Musculoskeletal: Sclerotic change in the T9 vertebral body unchanged in the interval. Small sclerotic focus posterior L1 level also stable. IMPRESSION: 1. No CT findings to explain the patient's history of left upper extremity swelling. There is some mild apparent edema in the left axilla but this is similar to prior study. No definite axillary or subclavian thrombosis on the left. No lymphadenopathy in the left axilla or thoracic inlet. 2. 9 mm short axis precarinal lymph node is new in the interval without other evidence for lymphadenopathy in the chest. Attention on follow-up recommended. 3. 7 mm nodule posterior right upper lobe is most likely atelectatic. This could also be reassessed at the time of followup imaging. 4. Similar appearance of sclerotic lesions in the thoracolumbar spine. Electronically Signed   By: EMisty StanleyM.D.   On: 11/10/2017 09:40   Mr BJeri CosWHEContrast  Result Date: 12/07/2017 CLINICAL DATA:  Metastatic breast cancer. Craniotomy and treatment for metastatic disease. EXAM: MRI HEAD WITHOUT AND WITH CONTRAST TECHNIQUE: Multiplanar, multiecho pulse sequences of the brain and surrounding structures were obtained without and with intravenous contrast. CONTRAST:  178mMULTIHANCE GADOBENATE DIMEGLUMINE 529 MG/ML IV SOLN COMPARISON:  MRI 07/29/2017 FINDINGS: Brain: MRI performed under general anesthesia. Good quality study obtained. 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 No recurrent tumor is identified in this area. No other areas of tumor involvement in the brain. Diffuse white matter  hyperintensity has progressed in the interval compatible with chemo/radiation effect. Negative for  acute infarct or hemorrhage. No fluid collection or edema. Vascular: Normal arterial flow void Skull and upper cervical spine: Negative Sinuses/Orbits: Negative Other: None IMPRESSION: Postop treatment of left cerebellar metastatic deposit. No recurrent tumor. No new metastatic disease Significant progression and diffuse white matter changes compatible with treatment effect. Electronically Signed   By: Franchot Gallo M.D.   On: 12/07/2017 14:14

## 2017-12-09 ENCOUNTER — Other Ambulatory Visit: Payer: Self-pay | Admitting: *Deleted

## 2017-12-09 MED ORDER — CYCLOBENZAPRINE HCL 5 MG PO TABS: 5.0000 mg | ORAL_TABLET | Freq: Two times a day (BID) | ORAL | 0 refills | Status: DC | PRN

## 2017-12-10 ENCOUNTER — Ambulatory Visit: Payer: Medicaid Other | Attending: Family Medicine | Admitting: Family Medicine

## 2017-12-10 ENCOUNTER — Telehealth: Payer: Self-pay | Admitting: *Deleted

## 2017-12-10 ENCOUNTER — Encounter: Payer: Self-pay | Admitting: Family Medicine

## 2017-12-10 VITALS — BP 117/77 | HR 96 | Temp 97.7°F | Ht 65.0 in | Wt 145.0 lb

## 2017-12-10 DIAGNOSIS — Z9889 Other specified postprocedural states: Secondary | ICD-10-CM | POA: Insufficient documentation

## 2017-12-10 DIAGNOSIS — Z881 Allergy status to other antibiotic agents status: Secondary | ICD-10-CM | POA: Insufficient documentation

## 2017-12-10 DIAGNOSIS — M797 Fibromyalgia: Secondary | ICD-10-CM | POA: Insufficient documentation

## 2017-12-10 DIAGNOSIS — F4310 Post-traumatic stress disorder, unspecified: Secondary | ICD-10-CM | POA: Diagnosis not present

## 2017-12-10 DIAGNOSIS — Z85841 Personal history of malignant neoplasm of brain: Secondary | ICD-10-CM | POA: Diagnosis not present

## 2017-12-10 DIAGNOSIS — G43909 Migraine, unspecified, not intractable, without status migrainosus: Secondary | ICD-10-CM | POA: Insufficient documentation

## 2017-12-10 DIAGNOSIS — Z87891 Personal history of nicotine dependence: Secondary | ICD-10-CM | POA: Diagnosis not present

## 2017-12-10 DIAGNOSIS — Z7982 Long term (current) use of aspirin: Secondary | ICD-10-CM | POA: Diagnosis not present

## 2017-12-10 DIAGNOSIS — C50312 Malignant neoplasm of lower-inner quadrant of left female breast: Secondary | ICD-10-CM | POA: Diagnosis present

## 2017-12-10 DIAGNOSIS — Z9012 Acquired absence of left breast and nipple: Secondary | ICD-10-CM | POA: Diagnosis not present

## 2017-12-10 DIAGNOSIS — G8929 Other chronic pain: Secondary | ICD-10-CM

## 2017-12-10 DIAGNOSIS — Z79899 Other long term (current) drug therapy: Secondary | ICD-10-CM | POA: Diagnosis not present

## 2017-12-10 DIAGNOSIS — F3130 Bipolar disorder, current episode depressed, mild or moderate severity, unspecified: Secondary | ICD-10-CM | POA: Diagnosis not present

## 2017-12-10 DIAGNOSIS — K219 Gastro-esophageal reflux disease without esophagitis: Secondary | ICD-10-CM | POA: Insufficient documentation

## 2017-12-10 DIAGNOSIS — R519 Headache, unspecified: Secondary | ICD-10-CM

## 2017-12-10 DIAGNOSIS — Z885 Allergy status to narcotic agent status: Secondary | ICD-10-CM | POA: Diagnosis not present

## 2017-12-10 DIAGNOSIS — Z131 Encounter for screening for diabetes mellitus: Secondary | ICD-10-CM | POA: Diagnosis not present

## 2017-12-10 DIAGNOSIS — R51 Headache: Secondary | ICD-10-CM | POA: Diagnosis not present

## 2017-12-10 LAB — POCT GLYCOSYLATED HEMOGLOBIN (HGB A1C): Hemoglobin A1C: 5.1

## 2017-12-10 MED ORDER — CYCLOBENZAPRINE HCL 5 MG PO TABS: 5.0000 mg | ORAL_TABLET | Freq: Two times a day (BID) | ORAL | 2 refills | Status: DC | PRN

## 2017-12-10 MED ORDER — GABAPENTIN 300 MG PO CAPS
300.0000 mg | ORAL_CAPSULE | Freq: Two times a day (BID) | ORAL | 3 refills | Status: DC
Start: 2017-12-10 — End: 2019-03-08

## 2017-12-10 MED ORDER — PANTOPRAZOLE SODIUM 40 MG PO TBEC: 40.0000 mg | DELAYED_RELEASE_TABLET | Freq: Every day | ORAL | 3 refills | Status: DC

## 2017-12-10 MED ORDER — FLUTICASONE PROPIONATE 50 MCG/ACT NA SUSP: 2.0000 | Freq: Every day | NASAL | 2 refills | Status: DC

## 2017-12-10 NOTE — Progress Notes (Signed)
Subjective:  Patient ID: [name], female    DOB: [birth year]  Age: 57 y.o. 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  Post left mastectomy and axillary lymph node dissection with expander placement) s/p suboccipital craniectomy for resection of posterior fossa metastasis, s/p radiation to the brain and breast, currently on Anastrozole, Denosumab and Trastuzumab.  She has had left upper extremity swelling without pain. Doppler was negative for DVT and she is being treated for cellulitis and remains on Keflex with her last visit to oncology 2 days ago. She has not been using her left upper extremity sleeve. 3 days ago she underwent MRI of the brain without contrast and results are as below.  MRI brain without contrast 12/07/17 IMPRESSION: Postop treatment of left cerebellar metastatic deposit. No recurrent tumor. No new metastatic disease  Significant progression and diffuse white matter changes compatible with treatment effect.  Her headaches have improved and she no longer takes tramadol but was prescribed Flexeril by her oncologist which does help her neck muscles and lower back and headache is minimal.  She was placed on Flonase and Zyrtec due to allergic rhinitis and possible eustachian tube dysfunction. She will be commencing physical therapy tomorrow.  Past Medical History:  Diagnosis Date  . Alcohol abuse   . Anemia    during chemo  . Anxiety    At age 29  . Arthritis Dx 2010  . Bipolar disorder ([place])   . Cancer ([place])    breast mets to brain  . Chronic pain   . Complication of anesthesia   . Depression   . Family history of adverse reaction to anesthesia    parents had PONV  . Fibromyalgia Dx 2005  . GERD (gastroesophageal reflux disease)   . Headache    hx  migraines  .  Lymphedema of left arm   . Opiate dependence ([place])   . PONV (postoperative nausea and vomiting)   . Port-A-Cath in place   . PTSD (post-traumatic stress disorder)     Past Surgical History:  Procedure Laterality Date  . APPLICATION OF CRANIAL NAVIGATION N/A 08/14/2016   Procedure: APPLICATION OF CRANIAL NAVIGATION;  Surgeon: Erline Levine, MD;  Location: [place] [facility];  Service: Neurosurgery;  Laterality: N/A;  . BREAST RECONSTRUCTION Left    with silicone implant  . CRANIOTOMY N/A 08/14/2016   Procedure: CRANIOTOMY TUMOR EXCISION WITH Lucky Rathke;  Surgeon: Erline Levine, MD;  Location: [place] [facility];  Service: Neurosurgery;  Laterality: N/A;  . FIBULA FRACTURE SURGERY Left   . MASTECTOMY Left   . RADIOLOGY WITH ANESTHESIA N/A 07/23/2016   Procedure: MRI OF BRAIN WITH AND WITHOUT;  Surgeon: Medication Radiologist, MD;  Location: [place];  Service: Radiology;  Laterality: N/A;  . RADIOLOGY WITH ANESTHESIA N/A 09/08/2016   Procedure: MRI OF BRAIN WITH AND WITHOUT CONTRAST;  Surgeon: Medication Radiologist, MD;  Location: [place];  Service: Radiology;  Laterality: N/A;  . RADIOLOGY WITH ANESTHESIA N/A 12/10/2016   Procedure: MRI OF BRAIN WITH AND WITHOUT;  Surgeon: Medication Radiologist, MD;  Location: [place];  Service: Radiology;  Laterality: N/A;  . RADIOLOGY WITH ANESTHESIA N/A 03/02/2017   Procedure: MRI of BRAIN W and W/OUT CONTRAST;  Surgeon: Medication Radiologist, MD;  Location: [place];  Service: Radiology;  Laterality: N/A;  . RADIOLOGY WITH ANESTHESIA N/A 07/29/2017  Procedure: RADIOLOGY WITH ANESTHESIA MRI OF BRAIN WITH AND WITHOUT CONTRAST;  Surgeon: Radiologist, Medication, MD;  Location: [place];  Service: Radiology;  Laterality: N/A;  . RADIOLOGY WITH ANESTHESIA N/A 12/07/2017   Procedure: MRI WITH ANESTHESIA OF BRAIN WITH AND WITHOUT CONTRAST;  Surgeon: Radiologist, Medication, MD;  Location: [place];  Service: Radiology;  Laterality: N/A;  . right power port placement Right     Allergies    Allergen Reactions  . Demerol Itching and Nausea And Vomiting  . Erythromycin Rash     Outpatient Medications Prior to Visit  Medication Sig Dispense Refill  . anastrozole (ARIMIDEX) 1 MG tablet Take 1 tablet (1 mg total) daily by mouth. 30 tablet 0  . Aspirin-Acetaminophen-Caffeine (GOODY HEADACHE PO) Take 1 packet by mouth See admin instructions. Up to 6 or 7 times a day as needed for pain    . Biotin 1000 MCG tablet Take 1,000 mcg by mouth daily.    . cephALEXin (KEFLEX) 500 MG capsule Take 1 capsule (500 mg total) by mouth 2 (two) times daily. 14 capsule 14  . Cholecalciferol (VITAMIN D3) 5000 units CAPS Take 5,000 Units by mouth daily.    . cycloSPORINE (RESTASIS) 0.05 % ophthalmic emulsion Place 1 drop into both eyes 2 (two) times daily.    [name] docusate sodium (COLACE) 100 MG capsule Take 100 mg by mouth daily as needed for mild constipation.     [name] ibuprofen (ADVIL,MOTRIN) 800 MG tablet TAKE 1 TABLET BY MOUTH THREE TIMES A DAY 90 tablet 0  . lamoTRIgine (LAMICTAL) 25 MG tablet Take 1 tablet (25 mg total) by mouth 2 (two) times daily. (Patient taking differently: Take 50 mg by mouth daily. ) 60 tablet 0  . loratadine-pseudoephedrine (CLARITIN-D 12 HOUR) 5-120 MG tablet Take 1 tablet by mouth 2 (two) times daily as needed for allergies. 20 tablet 1  . lurasidone (LATUDA) 40 MG TABS tablet Take 1 tablet (40 mg total) by mouth daily with breakfast. 30 tablet 0  . ondansetron (ZOFRAN) 8 MG tablet Take 1 tablet (8 mg total) by mouth every 8 (eight) hours as needed for nausea or vomiting. 90 tablet 1  . polyethylene glycol (MIRALAX / GLYCOLAX) packet TAKE 17 G BY MOUTH DAILY AS NEEDED FOR MILD CONSTIPATION. 30 packet 0  . traZODone (DESYREL) 100 MG tablet Take 1 tablet (100 mg total) by mouth at bedtime. (Patient taking differently: Take 200 mg by mouth at bedtime. ) 30 tablet 0  . cyclobenzaprine (FLEXERIL) 5 MG tablet Take 1 tablet (5 mg total) by mouth 2 (two) times daily as needed for muscle  spasms. 30 tablet 0  . fluticasone (FLONASE) 50 MCG/ACT nasal spray Place 2 sprays into both nostrils daily. 16 g 0  . gabapentin (NEURONTIN) 300 MG capsule Take 1 capsule (300 mg total) by mouth 2 (two) times daily. 60 capsule 3  . pantoprazole (PROTONIX) 40 MG tablet TAKE 1 TABLET BY MOUTH EVERY DAY 30 tablet 3   No facility-administered medications prior to visit.     ROS Review of Systems  Constitutional: Negative for activity change, appetite change and fatigue.  HENT: Negative for congestion, sinus pressure and sore throat.   Eyes: Negative for visual disturbance.  Respiratory: Negative for cough, chest tightness, shortness of breath and wheezing.   Cardiovascular: Negative for chest pain and palpitations.  Gastrointestinal: Negative for abdominal distention, abdominal pain and constipation.  Endocrine: Negative for polydipsia.  Genitourinary: Negative for dysuria and frequency.  Musculoskeletal: Negative for arthralgias and  back pain.  Skin: Negative for rash.  Neurological: Negative for tremors, light-headedness and numbness.  Hematological: Does not bruise/bleed easily.  Psychiatric/Behavioral: Negative for agitation and behavioral problems.    Objective:  BP 117/77   Pulse 96   Temp 97.7 F (36.5 C) (Oral)   Ht 5\' 5"  (1.651 m)   Wt 145 lb (65.8 kg)   SpO2 100%   BMI 24.13 kg/m   BP/Weight 12/10/2017 03/05/5680 01/06/5169  Systolic BP 017 494 496  Diastolic BP 77 69 74  Wt. (Lbs) 145 143.2 130  BMI 24.13 23.83 21.63  Some encounter information is confidential and restricted. Go to Review Flowsheets activity to see all data.     Physical Exam  Constitutional: She is oriented to person, place, and time. She appears well-developed and well-nourished.  Cardiovascular: Normal rate, normal heart sounds and intact distal pulses.  No murmur heard. Pulmonary/Chest: Effort normal and breath sounds normal. She has no wheezes. She has no rales. She exhibits no tenderness.    Port-A-Cath in the right upper chest wall  Abdominal: Soft. Bowel sounds are normal. She exhibits no distension and no mass. There is no tenderness.  Musculoskeletal: Normal range of motion.  Left upper extremity edema, no erythema or tenderness  Neurological: She is alert and oriented to person, place, and time.  Skin: Skin is warm and dry.  Psychiatric: She has a normal mood and affect.    CMP Latest Ref Rng & Units 12/07/2017 12/07/2017 10/26/2017  Glucose 70 - 140 mg/dL 145(H) 78 158(H)  BUN 7 - 26 mg/dL 13 14 12.0  Creatinine 0.60 - 1.10 mg/dL 1.09 0.95 0.8  Sodium 136 - 145 mmol/L 141 139 140  Potassium 3.3 - 4.7 mmol/L 3.9 6.0(H) 3.8  Chloride 98 - 109 mmol/L 103 102 -  CO2 22 - 29 mmol/L 29 24 24   Calcium 8.4 - 10.4 mg/dL 9.5 9.3 8.7  Total Protein 6.4 - 8.3 g/dL 6.8 5.8(L) 6.1(L)  Total Bilirubin 0.2 - 1.2 mg/dL <0.2(L) 1.6(H) <0.22  Alkaline Phos 40 - 150 U/L 41 34(L) 34(L)  AST 5 - 34 U/L 36(H) 78(H) 35(H)  ALT 0 - 55 U/L 37 32 48     Lab Results  Component Value Date   HGBA1C 5.1 12/10/2017    Assessment & Plan:   1. Primary cancer of lower-inner quadrant of left female breast [facility]) Status post left mastectomy , axillary lymph node dissection and expander placement Continue infusions as per oncology A1c 5.1 - fluticasone (FLONASE) 50 MCG/ACT nasal spray; Place 2 sprays into both nostrils daily.  Dispense: 16 g; Refill: 2  2. Screening for diabetes mellitus A1c 5.1 - POCT glycosylated hemoglobin (Hb A1C)  3. Bipolar I disorder, most recent episode depressed ([place]) Doing well on Lamictal Management as per psych- Dr Darleene Cleaver  4. Chronic nonintractable headache, unspecified headache type Stable - cyclobenzaprine (FLEXERIL) 5 MG tablet; Take 1 tablet (5 mg total) by mouth 2 (two) times daily as needed for muscle spasms.  Dispense: 60 tablet; Refill: 2  5. Gastroesophageal reflux disease without esophagitis Controlled on omeprazole   Meds ordered this  encounter  Medications  . cyclobenzaprine (FLEXERIL) 5 MG tablet    Sig: Take 1 tablet (5 mg total) by mouth 2 (two) times daily as needed for muscle spasms.    Dispense:  60 tablet    Refill:  2  . gabapentin (NEURONTIN) 300 MG capsule    Sig: Take 1 capsule (300 mg total) by mouth 2 (  two) times daily.    Dispense:  60 capsule    Refill:  3  . pantoprazole (PROTONIX) 40 MG tablet    Sig: Take 1 tablet (40 mg total) by mouth daily.    Dispense:  30 tablet    Refill:  3  . fluticasone (FLONASE) 50 MCG/ACT nasal spray    Sig: Place 2 sprays into both nostrils daily.    Dispense:  16 g    Refill:  2    Follow-up: Return in about 3 months (around 03/10/2018) for follow up on chronic conditions.   Arnoldo Morale MD

## 2017-12-10 NOTE — Telephone Encounter (Signed)
TCT patient to follow up on her [facility] visit on 12/08/17 for cellulitis of her left upper arm. She states she is doing better. She saw her PCP this morning and her PCP told her that the redness and the heat in her left upper arm is gone. Her arm is still swollen but not as much as it had been. Pt has appt on 12/15/17 with her physical therapist to work on the lymphedema.  No other questions or concerns voiced.

## 2017-12-13 ENCOUNTER — Ambulatory Visit: Payer: Self-pay | Admitting: Internal Medicine

## 2017-12-14 ENCOUNTER — Ambulatory Visit: Payer: Self-pay | Admitting: Radiation Oncology

## 2017-12-15 ENCOUNTER — Ambulatory Visit: Payer: Medicaid Other | Admitting: Physical Therapy

## 2017-12-15 DIAGNOSIS — I972 Postmastectomy lymphedema syndrome: Secondary | ICD-10-CM | POA: Diagnosis not present

## 2017-12-15 NOTE — Therapy (Signed)
[place], [place], [address] Phone: [phone]   Fax:  [phone]  Physical Therapy Treatment  Patient Details  Name: [name] MRN: [id number] Date of Birth: 1961-08-04 Referring Provider: [name], PA-C   Encounter Date: 12/15/2017  PT End of Session - 12/15/17 1727    Visit Number  3    Number of Visits  16    Date for PT Re-Evaluation  01/26/18    PT Start Time  9326    PT Stop Time  1437    PT Time Calculation (min)  52 min    Activity Tolerance  Other (comment) didn't tolerate bandaging due to claustrophobia    Behavior During Therapy  [facility] for tasks assessed/performed       Past Medical History:  Diagnosis Date  . Alcohol abuse   . Anemia    during chemo  . Anxiety    At age 22  . Arthritis Dx 2010  . Bipolar disorder ([place])   . Cancer ([place])    breast mets to brain  . Chronic pain   . Complication of anesthesia   . Depression   . Family history of adverse reaction to anesthesia    parents had PONV  . Fibromyalgia Dx 2005  . GERD (gastroesophageal reflux disease)   . Headache    hx  migraines  . Lymphedema of left arm   . Opiate dependence ([place])   . PONV (postoperative nausea and vomiting)   . Port-A-Cath in place   . PTSD (post-traumatic stress disorder)     Past Surgical History:  Procedure Laterality Date  . APPLICATION OF CRANIAL NAVIGATION N/A 08/14/2016   Procedure: APPLICATION OF CRANIAL NAVIGATION;  Surgeon: Erline Levine, MD;  Location: [place] NEURO ORS;  Service: Neurosurgery;  Laterality: N/A;  . BREAST RECONSTRUCTION Left    with silicone implant  . CRANIOTOMY N/A 08/14/2016   Procedure: CRANIOTOMY TUMOR EXCISION WITH Lucky Rathke;  Surgeon: Erline Levine, MD;  Location: [place] [facility];  Service: Neurosurgery;  Laterality: N/A;  . FIBULA FRACTURE SURGERY Left   . MASTECTOMY Left   . RADIOLOGY WITH ANESTHESIA N/A 07/23/2016   Procedure: MRI OF BRAIN WITH AND  WITHOUT;  Surgeon: Medication Radiologist, MD;  Location: [place];  Service: Radiology;  Laterality: N/A;  . RADIOLOGY WITH ANESTHESIA N/A 09/08/2016   Procedure: MRI OF BRAIN WITH AND WITHOUT CONTRAST;  Surgeon: Medication Radiologist, MD;  Location: [place];  Service: Radiology;  Laterality: N/A;  . RADIOLOGY WITH ANESTHESIA N/A 12/10/2016   Procedure: MRI OF BRAIN WITH AND WITHOUT;  Surgeon: Medication Radiologist, MD;  Location: [place];  Service: Radiology;  Laterality: N/A;  . RADIOLOGY WITH ANESTHESIA N/A 03/02/2017   Procedure: MRI of BRAIN W and W/OUT CONTRAST;  Surgeon: Medication Radiologist, MD;  Location: [place];  Service: Radiology;  Laterality: N/A;  . RADIOLOGY WITH ANESTHESIA N/A 07/29/2017   Procedure: RADIOLOGY WITH ANESTHESIA MRI OF BRAIN WITH AND WITHOUT CONTRAST;  Surgeon: Radiologist, Medication, MD;  Location: [place];  Service: Radiology;  Laterality: N/A;  . RADIOLOGY WITH ANESTHESIA N/A 12/07/2017   Procedure: MRI WITH ANESTHESIA OF BRAIN WITH AND WITHOUT CONTRAST;  Surgeon: Radiologist, Medication, MD;  Location: [place];  Service: Radiology;  Laterality: N/A;  . right power port placement Right     There were no vitals filed for this visit.  Subjective Assessment - 12/15/17 1349    Subjective  Finished the antibiotic last night.  Arm redness comes and  goes now.  I can see my hand, but look at my elbow (swollen), and it's sore.    Pertinent History  Left mastectomy and sentinel node biopsy 08/21/15 and then axillary dissection following that which showed 14/15 positive nodes. Then chemotherapy and radiation after breast reconstruction. Brain tumor removed 08/14/16 which was metastatic disease followed by full brain radiation. Surgery and chemo were done in [place]. 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 Currently she continues with Xgeva and Herceptin and reports her disease is stable.    Currently in Pain?  Yes    Pain  Location  Elbow    Pain Orientation  Left    Pain Descriptors / Indicators  Aching;Sore    Aggravating Factors   wearing the sleeve    Pain Relieving Factors  ibuprofen                      [facility] Adult PT Treatment/Exercise - 12/15/17 0001      Manual Therapy   Manual Therapy  Compression Bandaging    Manual Lymphatic Drainage (MLD)  In supine, short neck, superficial and deep abdomen, right axilla and anterior interaxillary anastomosis, left groin and axillo-inguinal anastomosis, and left UE from dorsal hand to shoulder.    Compression Bandaging  Talked to patient about bandaging her arm in preparation for teaching her how to bandage the arm.  Then began bandaging with lotion applied, thin stockinette, Artiflex x 1 from hand to shoulder, 1-6 cm. bandage at left wrist and hand, and then started to apply 1-10 cm. bandage from wrist to shoulder, but patient that said she just wasn't going to be able to tolerate bandaging (due to claustrophobia), so this was stopped.               PT Short Term Goals - 11/11/17 1101      PT SHORT TERM GOAL #1   Title  Patient will be able to verbalize understanding of the treatment for lymphedema and ways to reduce her risk of exaccerbation of swelling.    Baseline  No knowledge    Time  4    Period  Weeks    Status  New      PT SHORT TERM GOAL #2   Title  Reduce left arm circumference at 10 cm proximal to her olecranon to </= 28 cm.    Baseline  Left arm at 10 cm proximal to her olecranon is currently 29.7 which is 5.8 cm larger than her right arm.    Time  4    Period  Weeks    Status  New      PT SHORT TERM GOAL #3   Title  Reduce left arm circumference at 15 cm proximal to her ulnar styloid process to </= 24 cm.    Baseline  Left arm at 15 cm proximal to her ulnar styloid is currently 25.9 cm which is 4.7 cm larger than her right arm.    Time  4    Period  Weeks    Status  New        PT Long Term Goals - 11/11/17 1105       PT LONG TERM GOAL #1   Title  Reduce left arm circumference at 10 cm proximal to her olecranon to </= 25 cm.    Baseline  Left arm at 10 cm proximal to her olecranon is currently 29.7  which is 5.8 cm larger than her right arm.    Time  8    Period  Weeks    Status  New      PT LONG TERM GOAL #2   Title  Reduce left arm circumference at 15 cm proximal to her ulnar styloid process to </= 23 cm.    Baseline  Left arm at 15 cm proximal to her ulnar styloid is currently 25.9 cm which is 4.7 cm larger than her right arm.    Time  8    Period  Weeks    Status  New      PT LONG TERM GOAL #3   Title  Patient will be able to verbalize understanding of where and how to be fitted for a compression sleeve and glove.    Baseline  No knowledge    Time  8    Period  Weeks    Status  New      PT LONG TERM GOAL #4   Title  Patient will demonstrate proper technique for performing compression bandaging and self manual lymph drainage to continue treatment into the Maintenance Phase.    Baseline  No knowledge    Time  8    Period  Weeks    Status  New            Plan - 12/15/17 1728    Clinical Impression Statement  Pt. realized as we started bandaging her that she would not be able to tolerate wearing bandages because she is claustrophobic, so we are changing the treatment plan.  Manual lymph drainage was performed today with some instruction.  Will instruct patient in this and perform it, and will work with her on obtaining appropriate compression garments. Will not see her 3x/week after initial three visits this month, but perhaps twice a week.    Rehab Potential  Excellent    Clinical Impairments Affecting Rehab Potential  None    PT Frequency  -- 1x/week x 3 weeks, then 2x/week x 2-4 weeks prn    PT Duration  6 weeks    PT Treatment/Interventions  ADLs/Self Care Home Management;Therapeutic exercise;Passive range of motion;Manual lymph drainage;Compression bandaging;Manual  techniques;Therapeutic activities;DME Instruction    PT Next Visit Plan  Instruct in self manual lymph drainage and discuss compression garments.    Recommended Other Services  will email [facility] to say patient could be measured now for garments    Consulted and Agree with Plan of Care  Patient       Patient will benefit from skilled therapeutic intervention in order to improve the following deficits and impairments:  Decreased knowledge of use of DME, Pain, Postural dysfunction, Decreased scar mobility, Decreased range of motion, Impaired UE functional use, Increased edema  Visit Diagnosis: Postmastectomy lymphedema     Problem List Patient Active Problem List   Diagnosis Date Noted  . GERD (gastroesophageal reflux disease) 12/10/2017  . Edema 11/09/2017  . Headache 05/21/2017  . Lithium overdose 04/26/2017  . Acute encephalopathy 03/16/2017  . Acute lower UTI   . Hypokalemia 02/18/2017  . Gait abnormality 01/06/2017  . Bipolar I disorder, most recent episode depressed ([place]) 12/08/2016  . Adjustment disorder with anxiety 12/06/2016  . Delirium due to another medical condition 12/03/2016  . Confusion 12/03/2016  . Diarrhea 12/03/2016  . Metastatic breast cancer ([place])   . Cerumen impaction 11/25/2016  . Otitis media 11/06/2016  . Brain metastasis ([place]) 07/27/2016  . Iron deficiency anemia 06/26/2016  .  Bone metastases ([place]) 06/03/2016  . Primary cancer of lower-inner quadrant of left female breast ([place]) 06/01/2016  . Pap smear for cervical cancer screening 03/28/2015  . Current smoker 03/28/2015  . Healthcare maintenance 03/28/2015  . Seasonal allergies 03/28/2015  . Anxiety state 02/28/2015  . Fibromyalgia 02/28/2015  . Family history of diabetes mellitus 02/28/2015  . H/O alcohol abuse     [name] 12/15/2017, 5:32 PM  [place], [place], [address] Phone: [phone]   Fax:   [phone]  Name: [name] MRN: [id number] Date of Birth: 07-28-1961  Serafina Royals, PT 12/15/17 5:32 PM

## 2017-12-16 ENCOUNTER — Encounter: Payer: Self-pay | Admitting: Internal Medicine

## 2017-12-16 ENCOUNTER — Inpatient Hospital Stay (HOSPITAL_BASED_OUTPATIENT_CLINIC_OR_DEPARTMENT_OTHER): Payer: Medicaid Other | Admitting: Internal Medicine

## 2017-12-16 VITALS — BP 120/81 | HR 88 | Temp 97.8°F | Resp 20 | Wt 144.5 lb

## 2017-12-16 DIAGNOSIS — Z79811 Long term (current) use of aromatase inhibitors: Secondary | ICD-10-CM

## 2017-12-16 DIAGNOSIS — R519 Headache, unspecified: Secondary | ICD-10-CM

## 2017-12-16 DIAGNOSIS — C7931 Secondary malignant neoplasm of brain: Secondary | ICD-10-CM

## 2017-12-16 DIAGNOSIS — C50312 Malignant neoplasm of lower-inner quadrant of left female breast: Secondary | ICD-10-CM

## 2017-12-16 DIAGNOSIS — Z5111 Encounter for antineoplastic chemotherapy: Secondary | ICD-10-CM | POA: Diagnosis not present

## 2017-12-16 DIAGNOSIS — R278 Other lack of coordination: Secondary | ICD-10-CM | POA: Diagnosis not present

## 2017-12-16 DIAGNOSIS — C7951 Secondary malignant neoplasm of bone: Secondary | ICD-10-CM

## 2017-12-16 DIAGNOSIS — G8929 Other chronic pain: Secondary | ICD-10-CM

## 2017-12-16 DIAGNOSIS — R51 Headache: Secondary | ICD-10-CM | POA: Diagnosis not present

## 2017-12-16 DIAGNOSIS — Z170 Estrogen receptor positive status [ER+]: Secondary | ICD-10-CM | POA: Diagnosis not present

## 2017-12-16 NOTE — Progress Notes (Signed)
[facility] [place], [place] [address] [phone]   Interval Evaluation  Date of Service: 12/16/17 Patient Name: [name] Patient MRN: [id number] Patient DOB: May 16, 1961 Provider: Ventura Sellers, MD  Identifying Statement:  [name] is a 57 y.o. 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 Vertell Limber 10/07/16: Whole brain treated to 35 Gy in 14 fractions with Dr. Lisbeth Renshaw  Interval History: Ms. [name] continues to have chronic daily headachea as prior.  She continues to dose ibuprofen three times per day, every day, and uses "goody powder" multiple times per day as well.  She has recently started on flexiril for chronic neck pain which has helped somewhat, although she feels the dose is too low.  She otherwise denies seizures, double vision, weakness, numbness or other new/progressive neurologic deficits.  She does acknowledge a little "slowing down" in her thinking since radiation- for example she used to be a fast reader but that is no longer the case.  Overall the effects are quite mild.        Current Outpatient Medications on File Prior to Visit  Medication Sig Dispense Refill  . anastrozole (ARIMIDEX) 1 MG tablet Take 1 tablet (1 mg total) daily by mouth. 30 tablet 0  . Aspirin-Acetaminophen-Caffeine (GOODY HEADACHE PO) Take 1 packet by mouth See admin instructions. Up to 6 or 7 times a day as needed for pain    . Biotin 1000 MCG tablet Take 1,000 mcg by mouth daily.    . cephALEXin (KEFLEX) 500 MG capsule Take 1 capsule (500 mg total) by mouth 2 (two) times daily. (Patient not taking: Reported on 12/15/2017) 14 capsule 14    . Cholecalciferol (VITAMIN D3) 5000 units CAPS Take 5,000 Units by mouth daily.    . cyclobenzaprine (FLEXERIL) 5 MG tablet Take 1 tablet (5 mg total) by mouth 2 (two) times daily as needed for muscle spasms. 60 tablet 2  . cycloSPORINE (RESTASIS) 0.05 % ophthalmic emulsion Place 1 drop into both eyes 2 (two) times daily.    [name] docusate sodium (COLACE) 100 MG capsule Take 100 mg by mouth daily as needed for mild constipation.     . fluticasone (FLONASE) 50 MCG/ACT nasal spray Place 2 sprays into both nostrils daily. 16 g 2  . gabapentin (NEURONTIN) 300 MG capsule Take 1 capsule (300 mg total) by mouth 2 (two) times daily. 60 capsule 3  . ibuprofen (ADVIL,MOTRIN) 800 MG tablet TAKE 1 TABLET BY MOUTH THREE TIMES A DAY 90 tablet 0  . lamoTRIgine (LAMICTAL) 25 MG tablet Take 1 tablet (25 mg total) by mouth 2 (two) times daily. (Patient taking differently: Take 50 mg by mouth daily. ) 60 tablet 0  . loratadine-pseudoephedrine (CLARITIN-D 12 HOUR) 5-120 MG tablet Take 1 tablet by mouth 2 (two) times daily as needed for allergies. 20 tablet 1  . lurasidone (LATUDA) 40 MG TABS tablet Take 1 tablet (40 mg total) by mouth daily with breakfast. 30 tablet 0  . ondansetron (ZOFRAN) 8 MG tablet Take 1 tablet (8 mg total) by mouth every 8 (eight) hours as needed for nausea or vomiting. 90 tablet 1  . pantoprazole (PROTONIX) 40 MG tablet Take 1  tablet (40 mg total) by mouth daily. 30 tablet 3  . polyethylene glycol (MIRALAX / GLYCOLAX) packet TAKE 17 G BY MOUTH DAILY AS NEEDED FOR MILD CONSTIPATION. 30 packet 0  . traZODone (DESYREL) 100 MG tablet Take 1 tablet (100 mg total) by mouth at bedtime. (Patient taking differently: Take 200 mg by mouth at bedtime. ) 30 tablet 0   No current facility-administered medications on file prior to visit.     Allergies:  Allergies  Allergen Reactions  . Demerol Itching and Nausea And Vomiting  . Erythromycin Rash   Past Medical History:  Past Medical History:  Diagnosis  Date  . Alcohol abuse   . Anemia    during chemo  . Anxiety    At age 79  . Arthritis Dx 2010  . Bipolar disorder ([place])   . Cancer ([place])    breast mets to brain  . Chronic pain   . Complication of anesthesia   . Depression   . Family history of adverse reaction to anesthesia    parents had PONV  . Fibromyalgia Dx 2005  . GERD (gastroesophageal reflux disease)   . Headache    hx  migraines  . Lymphedema of left arm   . Opiate dependence ([place])   . PONV (postoperative nausea and vomiting)   . Port-A-Cath in place   . PTSD (post-traumatic stress disorder)    Past Surgical History:  Past Surgical History:  Procedure Laterality Date  . APPLICATION OF CRANIAL NAVIGATION N/A 08/14/2016   Procedure: APPLICATION OF CRANIAL NAVIGATION;  Surgeon: Erline Levine, MD;  Location: [facility];  Service: Neurosurgery;  Laterality: N/A;  . BREAST RECONSTRUCTION Left    with silicone implant  . CRANIOTOMY N/A 08/14/2016   Procedure: CRANIOTOMY TUMOR EXCISION WITH Lucky Rathke;  Surgeon: Erline Levine, MD;  Location: [facility];  Service: Neurosurgery;  Laterality: N/A;  . FIBULA FRACTURE SURGERY Left   . MASTECTOMY Left   . RADIOLOGY WITH ANESTHESIA N/A 07/23/2016   Procedure: MRI OF BRAIN WITH AND WITHOUT;  Surgeon: Medication Radiologist, MD;  Location: [facility];  Service: Radiology;  Laterality: N/A;  . RADIOLOGY WITH ANESTHESIA N/A 09/08/2016   Procedure: MRI OF BRAIN WITH AND WITHOUT CONTRAST;  Surgeon: Medication Radiologist, MD;  Location: [facility];  Service: Radiology;  Laterality: N/A;  . RADIOLOGY WITH ANESTHESIA N/A 12/10/2016   Procedure: MRI OF BRAIN WITH AND WITHOUT;  Surgeon: Medication Radiologist, MD;  Location: [facility];  Service: Radiology;  Laterality: N/A;  . RADIOLOGY WITH ANESTHESIA N/A 03/02/2017   Procedure: MRI of BRAIN W and W/OUT CONTRAST;  Surgeon: Medication Radiologist, MD;  Location: [facility];  Service: Radiology;  Laterality: N/A;  . RADIOLOGY WITH ANESTHESIA N/A 07/29/2017    Procedure: RADIOLOGY WITH ANESTHESIA MRI OF BRAIN WITH AND WITHOUT CONTRAST;  Surgeon: Radiologist, Medication, MD;  Location: [facility];  Service: Radiology;  Laterality: N/A;  . RADIOLOGY WITH ANESTHESIA N/A 12/07/2017   Procedure: MRI WITH ANESTHESIA OF BRAIN WITH AND WITHOUT CONTRAST;  Surgeon: Radiologist, Medication, MD;  Location: [facility];  Service: Radiology;  Laterality: N/A;  . right power port placement Right    Social History:  Social History   Socioeconomic History  . Marital status: Single    Spouse name: Not on file  . Number of children: Not on file  . Years of education: Not on file  . Highest education level: Not on file  Social Needs  . Financial resource strain: Not on file  . Food  insecurity - worry: Not on file  . Food insecurity - inability: Not on file  . Transportation needs - medical: Not on file  . Transportation needs - non-medical: Not on file  Occupational History  . Not on file  Tobacco Use  . Smoking status: Current Every Day Smoker    Packs/day: 1.50    Types: Cigarettes  . Smokeless tobacco: Never Used  Substance and Sexual Activity  . Alcohol use: No    Comment: no ETOH since 08/22/12  . Drug use: No    Comment: states she's in recovery program for 5 years  . Sexual activity: No    Birth control/protection: Other-see comments    Comment: ablation  Other Topics Concern  . Not on file  Social History Narrative  . Not on file   Family History:  Family History  Problem Relation Age of Onset  . Diabetes Mother   . Bipolar disorder Mother   . CAD Father     Review of Systems: Constitutional: Denies fevers, chills or abnormal weight loss Eyes: Denies blurriness of vision Ears, nose, mouth, throat, and face: Denies mucositis or sore throat Respiratory: Denies cough, dyspnea or wheezes Cardiovascular: Denies palpitation, chest discomfort or lower extremity swelling Gastrointestinal:  occassional nausea GU: Denies dysuria or incontinence Skin:  Denies abnormal skin rashes Neurological: Per HPI Musculoskeletal: body pain Behavioral/Psych: +anxiety  Physical Exam: Vitals:   12/16/17 1032  BP: 120/81  Pulse: 88  Resp: 20  Temp: 97.8 F (36.6 C)  SpO2: 100%   KPS: 90. General: Alert, cooperative, pleasant, in no acute distress Head: Normal EENT: No conjunctival injection or scleral icterus. Oral mucosa moist Lungs: Resp effort normal Cardiac: Regular rate and rhythm Abdomen: Soft, non-distended abdomen Skin: No rashes cyanosis or petechiae. Extremities: No clubbing or edema  Neurologic Exam: Mental Status: Awake, alert, attentive to examiner. Oriented to self and environment. Language is fluent with intact comprehension.  Cranial Nerves: Visual acuity is grossly normal. Visual fields are full. Extra-ocular movements intact. No ptosis. Face is symmetric, tongue midline. Motor: Tone and bulk are normal. Power is full in both arms and legs. Reflexes are symmetric, no pathologic reflexes present.  Coordination: Mild dysmetria on finger to nose bilaterally.   Sensory: Intact to light touch and temperature Gait: Normal and tandem gait is normal.   Labs: I have reviewed the data as listed    Component Value Date/Time   NA 141 12/07/2017 1404   NA 140 10/26/2017 1038   K 3.9 12/07/2017 1404   K 3.8 10/26/2017 1038   CL 103 12/07/2017 1404   CO2 29 12/07/2017 1404   CO2 24 10/26/2017 1038   GLUCOSE 145 (H) 12/07/2017 1404   GLUCOSE 158 (H) 10/26/2017 1038   BUN 13 12/07/2017 1404   BUN 12.0 10/26/2017 1038   CREATININE 1.09 12/07/2017 1404   CREATININE 0.8 10/26/2017 1038   CALCIUM 9.5 12/07/2017 1404   CALCIUM 8.7 10/26/2017 1038   PROT 6.8 12/07/2017 1404   PROT 6.1 (L) 10/26/2017 1038   ALBUMIN 4.0 12/07/2017 1404   ALBUMIN 3.7 10/26/2017 1038   AST 36 (H) 12/07/2017 1404   AST 35 (H) 10/26/2017 1038   ALT 37 12/07/2017 1404   ALT 48 10/26/2017 1038   ALKPHOS 41 12/07/2017 1404   ALKPHOS 34 (L) 10/26/2017  1038   BILITOT <0.2 (L) 12/07/2017 1404   BILITOT <0.22 10/26/2017 1038   GFRNONAA 56 (L) 12/07/2017 1404   GFRAA >60 12/07/2017 1404   Lab  Results  Component Value Date   WBC 3.9 (L) 12/07/2017   NEUTROABS 3.6 10/26/2017   HGB 14.0 12/07/2017   HCT 41.7 12/07/2017   MCV 98.8 12/07/2017   PLT 226 12/07/2017    Imaging:  [facility] Clinician Interpretation: I have personally reviewed the CNS images as listed.  My interpretation, in the context of the patient's clinical presentation, is stable disease  Mr Jeri Cos Wo Contrast  Result Date: 12/07/2017 CLINICAL DATA:  Metastatic breast cancer. Craniotomy and treatment for metastatic disease. EXAM: MRI HEAD WITHOUT AND WITH CONTRAST TECHNIQUE: Multiplanar, multiecho pulse sequences of the brain and surrounding structures were obtained without and with intravenous contrast. CONTRAST:  10m MULTIHANCE GADOBENATE DIMEGLUMINE 529 MG/ML IV SOLN COMPARISON:  MRI 07/29/2017 FINDINGS: Brain: MRI performed under general anesthesia. Good quality study obtained. 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 No recurrent tumor is identified in this area. No other areas of tumor involvement in the brain. Diffuse white matter hyperintensity has progressed in the interval compatible with chemo/radiation effect. Negative for acute infarct or hemorrhage. No fluid collection or edema. Vascular: Normal arterial flow void Skull and upper cervical spine: Negative Sinuses/Orbits: Negative Other: None IMPRESSION: Postop treatment of left cerebellar metastatic deposit. No recurrent tumor. No new metastatic disease Significant progression and diffuse white matter changes compatible with treatment effect. Electronically Signed   By: CFranchot GalloM.D.   On: 12/07/2017 14:14     Assessment/Plan  1. Brain metastasis ([place] 2. Chronic nonintractable headache, unspecified headache type  Ms. HGoertzenis clinically and radiographically stable at this time.   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    She will continue to have chronic daily medication-overuse headaches for as long as she continues her analgesic regimen.  If she is ready to wean either the powder or the ibuprofen, we are happy to help this along with solumedrol dose packs as needed.   We appreciate the opportunity to participate in the care of ERoselina Burgueno   All questions were answered. The patient knows to call the clinic with any problems, questions or concerns. No barriers to learning were detected.  The total time spent in the appointment was 40 minutes and more than 50% was on counseling and review of test results   ZVentura Sellers MD Medical Director of Neuro-Oncology [facility] WGolden Glades01/17/19 10:28 AM

## 2017-12-16 NOTE — Progress Notes (Signed)
[name]  [phone] [phone]     ID: [name] DOB: 04/17/1961  MR#: [id number]  LTJ#:[id number]  Patient Care Team: Arnoldo Morale, MD as PCP - General (Family Medicine) Kyung Rudd, MD as Consulting Physician (Radiation Oncology) Erline Levine, MD as Consulting Physician (Neurosurgery) Corena Pilgrim, MD as Consulting Physician (Psychiatry) Mickeal Skinner Acey Lav, MD as Consulting Physician (Psychiatry) Nehemiah Settle, MD as Referring Physician (Plastic Surgery)  CHIEF COMPLAINT: Metastatic triple positive breast cancer  CURRENT TREATMENT: Trastuzumab, denosumab, anastrozole   INTERVAL HISTORY: [name] returns today for follow-up and treatment of her triple positive breast cancer.  She continues on anastrozole, generally with good tolerance. She notes that she has occasional hot flashes. She notes issues with vaginal dryness and low libido.   She also continues on trastuzumab every 21 days and Xgeva every 28 days with good tolerance.  She completed a chest CT scan on 11/10/2017 showed: No CT findings to explain the patient's history of left upper extremity swelling. There is some mild apparent edema in the left axilla but this is similar to prior study. No definite axillary or subclavian thrombosis on the left. No lymphadenopathy in the left axilla or thoracic inlet. 9 mm short axis precarinal lymph node is new in the interval without other evidence for lymphadenopathy in the chest. 7 mm nodule posterior right upper lobe is most likely atelectatic. Similar appearance of sclerotic lesions in the thoracolumbar spine.  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 No recurrent tumor. No new metastatic disease. Significant progression and diffuse white matter changes compatible with treatment effect   REVIEW OF SYSTEMS: [name] reports that she will have a brain MRI scan every 4 months  with her next being in May 2019 under Dr. Mickeal Skinner. She notes that her holidays were very busy. She notes that she still lives in [place], and all of her family including her mom, dad, and sister came to visit. She notes that her ex-husband and children also visited. She notes that she has gained a lot of weight since the holidays, and she ate a lot of sweets. She notes that she is not currently exercising because it's too cold to walk outside. She notes that she is not working right now. She reports redness and pain on her left arm and will go to physical therapy on 12/24/55. She notes that she would like her labs drawn through her flush. She denies unusual headaches, visual changes, nausea, vomiting, or dizziness. There has been no unusual cough, phlegm production, or pleurisy. This been no change in bowel or bladder habits. She denies unexplained fatigue or unexplained weight loss, bleeding, rash, or fever. A detailed review of systems was otherwise stable.   BREAST CANCER HISTORY: From the original intake note:  [name] was aware of a "lemon sized lump in" her left axilla for about a year before bringing it to medical attention. By then she had developed left breast and left axillary swelling (June 2016). She presented to the local emergency room and had a chest CT scan 06/06/2015 which showed a nodule in the left breast measuring 0.9 cm and questionable left axillary adenopathy. She then proceeded to bilateral diagnostic mammography and left breast ultrasonography 06/19/2015. There were no prior films for comparison (last mammography 12 years prior).. The breast density was category C. Mammography showed in the left breast upper inner quadrant a 7 cm area including a small mass and significant pleomorphic calcifications.  Ultrasonography defined the mass as measuring 1.2 cm. The left axilla appeared unremarkable. There was significant skin edema.  Biopsy of the left breast mass 06/19/2015 showed (SP  [id number]) an invasive ductal carcinoma, grade 2, estrogen receptor 83% positive, progesterone receptor 26% positive, and HER-2 amplified by immunohistochemstry with a 3+ reading. The patient had biopsies of a separate area in the left breast August of the same year and this showed atypical ductal hyperplasia. (SP [id number]).  Accordingly after appropriate discussion on 08/21/2015 the patient proceeded to left mastectomy with left axillary sentinel lymph node sampling, which, since the lymph nodes were positive, extended to the procedure to left axillary lymph node dissection. The pathology (SP [id number]) showed an invasive ductal carcinoma, grade 3, measuring in excess of 9 cm. There were also skin satellites, not contiguous with the invasive carcinoma. Margins were clear and ample. There was evidence of lymphovascular invasion. A total of 15 lymph nodes were removed, including 5 sentinel lymph nodes, all of which were positive, so that the final total was 14 out of 15 lymph nodes involved by tumor. There was evidence of extranodal extension. The final pathology was pT4b pN2a, stage IIIB  CA-27-29 and CEA 09/19/2015 were non-informative October 2016.  Unfortunately CT scans of the chest abdomen and pelvis 09/16/2015 showed bony metastases to the right scapula, left iliac crest, and also L4 and T-spine. There were questionable liver cysts which on repeat CT scan 03/02/2016 appear to be a little bit more well-defined, possibly a little larger. There were also some possible right upper lobe lung lesions.  Adjuvant treatment consisted of docetaxel, trastuzumab and pertuzumab, with the final (6th) docetaxel dose given 02/11/2016. She continues on trastuzumab and pertuzumab, with the 11th cycle given 05/05/2016. Echocardiogram 02/26/2016 showed an ejection fraction of 55%. She receives denosumab/Xgeva every 4 weeks.. She also receives radiation, started 06/09/201, to be completed 06/26/2016.  Her subsequent history  is as detailed below   PAST MEDICAL HISTORY: Past Medical History:  Diagnosis Date  . Alcohol abuse   . Anemia    during chemo  . Anxiety    At age 45  . Arthritis Dx 2010  . Bipolar disorder ([place])   . Cancer (HCC)    breast  . Chronic pain   . Depression   . Fibromyalgia Dx 2005  . GERD (gastroesophageal reflux disease)   . Headache    hx  migraines  . Opiate dependence ([place])   . Port-a-cath in place   . PTSD (post-traumatic stress disorder)     PAST SURGICAL HISTORY: Past Surgical History:  Procedure Laterality Date  . APPLICATION OF CRANIAL NAVIGATION N/A 08/14/2016   Procedure: APPLICATION OF CRANIAL NAVIGATION;  Surgeon: Erline Levine, MD;  Location: [facility];  Service: Neurosurgery;  Laterality: N/A;  . BREAST RECONSTRUCTION Left    with silicone implant  . CRANIOTOMY N/A 08/14/2016   Procedure: CRANIOTOMY TUMOR EXCISION WITH Lucky Rathke;  Surgeon: Erline Levine, MD;  Location: [facility];  Service: Neurosurgery;  Laterality: N/A;  . FIBULA FRACTURE SURGERY    . MASTECTOMY Left   . RADIOLOGY WITH ANESTHESIA N/A 07/23/2016   Procedure: MRI OF BRAIN WITH AND WITHOUT;  Surgeon: Medication Radiologist, MD;  Location: [facility];  Service: Radiology;  Laterality: N/A;  . right power port placement Right     FAMILY HISTORY Family History  Problem Relation Age of Onset  . Diabetes Mother   . Bipolar disorder Mother   . CAD Father   The patient's father  still living, age 41, in [place]. He had prostate cancer at some point in the past. The patient's mother died at age 84 from complications of diabetes. The patient had no brothers, 2 sisters. A paternal grandmother had lung cancer in the setting of tobacco abuse. There is no other history of cancer in the family to her knowledge  GYNECOLOGIC HISTORY:  No LMP recorded. Patient has had an ablation. Menarche approximately age 6. First live birth in 49. The patient is GX P2. She underwent endometrial ablation in  2016.  SOCIAL HISTORY:  The patient is originally from [place]. She has lived in [place] before but more recently was in [place]. She is back here because she could not afford her rent in [place]. She is living here and a temporary situation. She is divorced. HER-2 children are [name] who lives in [place] and works as a Development worker, community, and [name] who also lives in [place] and works as a Catering manager. The patient has a grandchild, Arelia Longest, 50 years old as of July 2017, living in [place] with his mother. The patient has not established herself with a local church yet.    ADVANCED DIRECTIVES: Not in place; at the 06/03/2016 visit the patient was given the appropriate forms to complete and notarize at her discretion   HEALTH MAINTENANCE: Social History  Substance Use Topics  . Smoking status: Current Every Day Smoker    Packs/day: 0.50    Types: Cigarettes  . Smokeless tobacco: Never Used     Comment: Pt is on Chantix at present time  . Alcohol use Yes     Comment: no ETOH since 08/22/12     Colonoscopy:  PAP:  Bone density:   Allergies  Allergen Reactions  . Demerol Itching and Nausea And Vomiting  . Erythromycin Rash    Can take zpak    Current Outpatient Prescriptions  Medication Sig Dispense Refill  . cetirizine (ZYRTEC) 10 MG tablet Take 1 tablet (10 mg total) by mouth daily. 30 tablet 11  . dexamethasone (DECADRON) 4 MG tablet Take 1 tablet (4 mg total) by mouth 2 (two) times daily. 60 tablet 0  . diphenoxylate-atropine (LOMOTIL) 2.5-0.025 MG tablet Take 2 tablets by mouth 4 (four) times daily as needed for diarrhea or loose stools. 40 tablet 0  . fluticasone (FLONASE) 50 MCG/ACT nasal spray Place 1 spray into the nose daily.    [name] gabapentin (NEURONTIN) 300 MG capsule Take 2 capsules (600 mg total) by mouth 3 (three) times daily. 300 mg at night for one day, twice daily for one day, then three times daily 180 capsule 2  . hyaluronate sodium (RADIAPLEXRX) GEL  Apply 1 application topically.    [name] HYDROcodone-acetaminophen (NORCO) 7.5-325 MG tablet Take 1-2 tablets by mouth every 6 (six) hours as needed for severe pain (give one tab, then if other tab is needed, may give 30 min later.). 60 tablet 0  . ibuprofen (ADVIL,MOTRIN) 200 MG tablet Take 800 mg by mouth every 6 (six) hours as needed for mild pain.    [name] lamoTRIgine (LAMICTAL) 100 MG tablet Take 1.5 tablets (150 mg total) by mouth 2 (two) times daily. 60 tablet 3  . lidocaine-prilocaine (EMLA) cream Apply 1 application topically as needed. 30 g 0  . lithium carbonate 300 MG capsule Take 1 capsule (300 mg total) by mouth 2 (two) times daily with a meal. 60 capsule 3  . Loperamide HCl (IMODIUM PO) Take 1 tablet by mouth daily as needed (diarhea).    [name]  LORazepam (ATIVAN) 0.5 MG tablet Take 1 tablet (0.5 mg total) by mouth every 8 (eight) hours as needed for anxiety. 30 tablet 1  . ondansetron (ZOFRAN) 8 MG tablet Take 1 tablet (8 mg total) by mouth every 8 (eight) hours as needed for nausea or vomiting. 30 tablet 1  . pantoprazole (PROTONIX) 40 MG tablet Take 1 tablet (40 mg total) by mouth daily. 30 tablet 3  . polyethylene glycol powder (GLYCOLAX/MIRALAX) powder Take 17 g by mouth 2 (two) times daily as needed for mild constipation. (Patient not taking: Reported on 08/13/2016) 3350 g 3  . potassium chloride (K-DUR) 10 MEQ tablet Take 1 tablet (10 mEq total) by mouth daily. 30 tablet 3  . prochlorperazine (COMPAZINE) 10 MG tablet Take 10 mg by mouth daily as needed for nausea.     [name] tiZANidine (ZANAFLEX) 4 MG tablet Take 1 tablet (4 mg total) by mouth daily. 30 tablet 0  . traMADol (ULTRAM) 50 MG tablet Take 1 tablet (50 mg total) by mouth every 6 (six) hours as needed. 60 tablet 0  . trazodone (DESYREL) 300 MG tablet Take 1 tablet (300 mg total) by mouth at bedtime. 30 tablet 3  . triamcinolone cream (KENALOG) 0.5 % Apply 1 application topically daily as needed.  1  . trolamine salicylate (ASPERCREME) 10  % cream Apply 1 application topically as needed for muscle pain. Reported on 06/01/2016    . varenicline (CHANTIX) 0.5 MG tablet Take 1 tablet (0.5 mg total) by mouth daily. 30 tablet 3  . Vitamin D, Cholecalciferol, 1000 units CAPS Take 1,000 Units by mouth daily.     No current facility-administered medications for this visit.     OBJECTIVE: Middle-aged white woman in no acute distress  Vitals:   12/20/17 1123  BP: 122/74  Pulse: 89  Resp: 18  Temp: 97.6 F (36.4 C)  TempSrc: Oral  SpO2: 99%  Weight: 144 lb 11.2 oz (65.6 kg)  Height: 5' 5"  (1.651 m)  Body mass index is 24.08 kg/m.  ECOG: 1  Sclerae unicteric, EOMs intact Oropharynx clear and moist No cervical or supraclavicular adenopathy Lungs no rales or rhonchi Heart regular rate and rhythm Abd soft, nontender, positive bowel sounds MSK no focal spinal tenderness, chronic left upper extremity lymphedema, moderate, with some skin induration in the left forearm area; there is mild erythema, which is chronic. Neuro: nonfocal, well oriented, appropriate affect Breasts: The right breast is unremarkable.  The left breast has undergone mastectomy.  There is an expander in place.  There is no evidence of local recurrence.  Both axillae are benign.  LAB RESULTS: CBC    Component Value Date/Time   WBC 3.8 (L) 12/20/2017 1048   RBC 4.27 12/20/2017 1048   HGB 14.1 12/20/2017 1048   HGB 12.1 10/26/2017 1038   HCT 42.3 12/20/2017 1048   HCT 35.7 10/26/2017 1038   PLT 169 12/20/2017 1048   PLT 167 10/26/2017 1038   MCV 99.1 12/20/2017 1048   MCV 98.4 10/26/2017 1038   MCH 33.0 12/20/2017 1048   MCHC 33.3 12/20/2017 1048   RDW 12.4 12/20/2017 1048   RDW 13.3 10/26/2017 1038   LYMPHSABS 1.1 12/20/2017 1048   LYMPHSABS 0.5 (L) 10/26/2017 1038   MONOABS 0.3 12/20/2017 1048   MONOABS 0.1 10/26/2017 1038   EOSABS 0.1 12/20/2017 1048   EOSABS 0.0 10/26/2017 1038   BASOSABS 0.0 12/20/2017 1048   BASOSABS 0.0 10/26/2017 1038    CMP Latest Ref Rng & Units 12/20/2017  12/07/2017 12/07/2017  Glucose 70 - 140 mg/dL 97 145(H) 78  BUN 7 - 26 mg/dL 10 13 14   Creatinine 0.60 - 1.10 mg/dL 1.04 1.09 0.95  Sodium 136 - 145 mmol/L 142 141 139  Potassium 3.3 - 4.7 mmol/L 3.9 3.9 6.0(H)  Chloride 98 - 109 mmol/L 105 103 102  CO2 22 - 29 mmol/L 28 29 24   Calcium 8.4 - 10.4 mg/dL 9.1 9.5 9.3  Total Protein 6.4 - 8.3 g/dL 6.4 6.8 5.8(L)  Total Bilirubin 0.2 - 1.2 mg/dL <0.2(L) <0.2(L) 1.6(H)  Alkaline Phos 40 - 150 U/L 45 41 34(L)  AST 5 - 34 U/L 26 36(H) 78(H)  ALT 0 - 55 U/L 27 37 32   CA 27 is down to 3.5 as of 08-2017  STUDIES: She completed a chest CT scan on 11/10/2017 showed: No CT findings to explain the patient's history of left upper extremity swelling. There is some mild apparent edema in the left axilla but this is similar to prior study. No definite axillary or subclavian thrombosis on the left. No lymphadenopathy in the left axilla or thoracic inlet. 9 mm short axis precarinal lymph node is new in the interval without other evidence for lymphadenopathy in the chest. 7 mm nodule posterior right upper lobe is most likely atelectatic. Similar appearance of sclerotic lesions in the thoracolumbar spine.  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 No recurrent tumor. No new metastatic disease. Significant progression and diffuse white matter changes compatible with treatment effect   ELIGIBLE FOR AVAILABLE RESEARCH PROTOCOL: no  ASSESSMENT: 57 y.o. [place] woman with stage IV left-sided breast cancer involving bone and central nervous system  (1) s/p left breast lower inner quadrant biopsy 06/19/2015 for a clinical T2-3 NX invasive ductal carcinoma, grade 2, triple positive.  (2) status post left mastectomy and axillary lymph node dissection  with immediate expander placement 07/18/2015 for an mpT4 pN2,stage IIIB invasive ductal carcinoma, grade 3, with  negative margins.  (a) definitive implant exchange to be scheduled in December   METASTATIC DISEASE: October 2016  (3) CT scan of the chest abdomen and pelvis  09/16/2015 shows metastatic lesions in the right scapula, left iliac crest, L4, and T spine. There were questionable liver cysts, with repeat CT scan 03/02/2016 showing possible right upper lobe lung lesions and possibly increased liver lesions  (a) CT scan of the chest 06/17/2016 shows no active disease in the lungs or liver  (b) Bone scan July 2017 showed no evidence of bony metastatic disease   (c) head CT 07/08/2016 showed a cerebellar lesion, confirmed by MRI 07/23/2016, status post craniotomy 08/14/2016, confirming a metastatic deposit which was estrogen and progesterone receptor negative, HER-2 amplified with a signals ratio of 7.16, number per cell 13.25  (d) CA 27-29 is not informative  (4) received docetaxel every 3 weeks 6 together with trastuzumab and pertuzumab, last docetaxel dose 02/11/2016  (5) adjuvant radiation7/03/2016 to 06/26/2016 at [facility]: 1. The Left chest wall was treated to 23.4 Gy in 13 fractions at 1.8 Gy per fraction. 2. The Left chest wall was boosted to 10 Gy in 5 fractions at 2 Gy per fraction. 3. The Left Sclav/PAB was treated to 23.4 Gy in 13 fractions at 1.8 Gy per fraction.  [Note: Including the patient's treatment in [place] (received 15 fractions per Dr. Maryan Rued near [place], [place]), the patient received 50.4 Gy to the left chest wall and supraclavicular region. ]  (6)  started trastuzumab and pertuzumab October 2016, continuing every 3 weeks,  (a) echocardiogram 02/26/2016 showed a well preserved ejection fraction  (b) echocardiogram 07/01/2016 shows an ejection fraction in the 60-65%   (c) pertuzumab discontinued 10/2016 with uncontrolled diarrhea  (d) echocardiogram 11/11/2016 showed an ejection fraction in the 60-65%  (e) echocardiogram 03/03/2017 shows an ejection fraction of  60-65%  (f) echocardiogram on 05/19/2017 shows an ejection fraction of 55-60%  (g) echocardiogram 09/24/2017 shows the ejection fraction in the 60-65% range.    (7) started denosumab/Xgeva October 2017, repeated every 4 weeks  (8) started anastrozole October 2017   (a) bone scan 11/10/2016 shows no active disease  (b) chest CT scan 11/10/2016 stable, with no evidence of active disease  (c) chest CT and bone scan 07/02/2017 show no evidence of active disease  (d) CT scan of the chest with contrast 11/10/2017 shows some left axillary edema, but no evidence of thrombosis or adenopathy in that area, 0.9 cm precarinal lymph and 0.7 cm right upper lobe nodule node; bone lesions stable   (9) history of bipolar disorder  (a) currently on Lamictal and Latuda as well as Desiree L and Neurontin  (10) mild anemia with a significant drop in the MCV, ferritin 10 06/03/2016,   (a) Feraheme given 06/12/2016 and 06/18/2016  (11) tobacco abuse: Chantix started 06/18/2016--she is not currently trying to quit   (12) 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  (a) brain irradiation10/19/17-11/08/17: Whole brain/ 35 Gy in 14 fractions   (b) repeat brain MRI obtained 12/10/2016 shows no active disease in the brain  (c) repeat brain MRI 03/02/2017 shows no evidence of residual or recurrent disease  (d) repeat brain MRI 07/29/2017 shows no evidence of residual or recurrent disease  (e) repeat brain MRI 12/07/2017 shows no evidence of disease recurrence.  There is progressive white matter change secondary to prior treatment.   PLAN: Tashika is now a little over 2 years out from definitive diagnosis of metastatic breast cancer, with no evidence of active disease.  This is very favorable.  She does have 2 subcentimeter findings on the chest CT scan which will need to be followed up on.  Likely we will repeat a chest CT scan in March or April of this year.  She has no  progression in the brain but there are some progressive changes secondary to her prior treatments.  She is followed by Dr. Mickeal Skinner for this.  Accordingly we are continuing anastrozole and trastuzumab indefinitely.  She will be due for an echocardiogram within the next few weeks.  The photos from her holiday family gatherings are very gratifying  She knows to call for any issues that may develop before the next visit here.   Aniyia Rane, Virgie Dad, MD  12/20/17 12:14 PM Medical Oncology and Hematology [facility] [address] [place], [place] [address] Tel. [phone]    Fax. [phone]  This document serves as a record of services personally performed by Lurline Del, MD. It was created on his behalf by Sheron Nightingale, a trained medical scribe. The creation of this record is based on the scribe's personal observations and the provider's statements to them.   I have reviewed the above documentation for accuracy and completeness, and I agree with the above.

## 2017-12-17 ENCOUNTER — Telehealth: Payer: Self-pay | Admitting: *Deleted

## 2017-12-17 ENCOUNTER — Ambulatory Visit
Admission: RE | Admit: 2017-12-17 | Discharge: 2017-12-17 | Disposition: A | Discharge status: Home / Self Care | Payer: Medicaid Other | Source: Ambulatory Visit | Attending: Adult Health | Admitting: Adult Health

## 2017-12-17 DIAGNOSIS — Z1239 Encounter for other screening for malignant neoplasm of breast: Secondary | ICD-10-CM

## 2017-12-17 NOTE — Telephone Encounter (Signed)
This RN spoke with pt per her call stating " arm is turning pink and hurts ".  Note arm has known left arm lymphedema - pt is to start therapy next Wednesday.  Stori states " whenever I wear my compression sleeve my hand swells really bad "  Caree states she also tends to sleep more on her left side.  Arm is not as swollen or red as 2 weeks ago but concerning for pt. ( she has completed course of antibiotics )  She is scheduled today for fitting/sizing for new compression garments ( note pt had difficulty with compression wrapping due to feelings of claustrophobia.)  Plan per above is Jaylenne will come into the office this afternoon post fitting for this RN to assess arm for appropriate recommendations.

## 2017-12-20 ENCOUNTER — Inpatient Hospital Stay: Payer: Medicaid Other

## 2017-12-20 ENCOUNTER — Inpatient Hospital Stay (HOSPITAL_BASED_OUTPATIENT_CLINIC_OR_DEPARTMENT_OTHER): Payer: Medicaid Other | Admitting: Oncology

## 2017-12-20 ENCOUNTER — Telehealth: Payer: Self-pay | Admitting: Oncology

## 2017-12-20 VITALS — BP 122/74 | HR 89 | Temp 97.6°F | Resp 18 | Ht 65.0 in | Wt 144.7 lb

## 2017-12-20 DIAGNOSIS — Z170 Estrogen receptor positive status [ER+]: Secondary | ICD-10-CM | POA: Diagnosis not present

## 2017-12-20 DIAGNOSIS — R519 Headache, unspecified: Secondary | ICD-10-CM

## 2017-12-20 DIAGNOSIS — G8929 Other chronic pain: Secondary | ICD-10-CM

## 2017-12-20 DIAGNOSIS — C7951 Secondary malignant neoplasm of bone: Secondary | ICD-10-CM

## 2017-12-20 DIAGNOSIS — F17200 Nicotine dependence, unspecified, uncomplicated: Secondary | ICD-10-CM | POA: Diagnosis not present

## 2017-12-20 DIAGNOSIS — C50312 Malignant neoplasm of lower-inner quadrant of left female breast: Secondary | ICD-10-CM

## 2017-12-20 DIAGNOSIS — Z5111 Encounter for antineoplastic chemotherapy: Secondary | ICD-10-CM | POA: Diagnosis not present

## 2017-12-20 DIAGNOSIS — Z79811 Long term (current) use of aromatase inhibitors: Secondary | ICD-10-CM | POA: Diagnosis not present

## 2017-12-20 DIAGNOSIS — R51 Headache: Secondary | ICD-10-CM

## 2017-12-20 DIAGNOSIS — F319 Bipolar disorder, unspecified: Secondary | ICD-10-CM | POA: Diagnosis not present

## 2017-12-20 DIAGNOSIS — C50919 Malignant neoplasm of unspecified site of unspecified female breast: Secondary | ICD-10-CM

## 2017-12-20 DIAGNOSIS — F3130 Bipolar disorder, current episode depressed, mild or moderate severity, unspecified: Secondary | ICD-10-CM

## 2017-12-20 DIAGNOSIS — C7931 Secondary malignant neoplasm of brain: Secondary | ICD-10-CM

## 2017-12-20 LAB — COMPREHENSIVE METABOLIC PANEL
ALT: 27 U/L (ref 0–55)
AST: 26 U/L (ref 5–34)
Albumin: 3.8 g/dL (ref 3.5–5.0)
Alkaline Phosphatase: 45 U/L (ref 40–150)
Anion gap: 9 (ref 3–11)
BUN: 10 mg/dL (ref 7–26)
CO2: 28 mmol/L (ref 22–29)
Calcium: 9.1 mg/dL (ref 8.4–10.4)
Chloride: 105 mmol/L (ref 98–109)
Creatinine, Ser: 1.04 mg/dL (ref 0.60–1.10)
GFR calc Af Amer: >60 mL/min (ref >60)
GFR calc non Af Amer: 59 mL/min — ABNORMAL LOW (ref >60)
Glucose, Bld: 97 mg/dL (ref 70–140)
Potassium: 3.9 mmol/L (ref 3.3–4.7)
Sodium: 142 mmol/L (ref 136–145)
Total Bilirubin: <0.2 mg/dL — ABNORMAL LOW (ref 0.2–1.2)
Total Protein: 6.4 g/dL (ref 6.4–8.3)

## 2017-12-20 LAB — CBC WITH DIFFERENTIAL/PLATELET
Basophils Absolute: 0 10*3/uL (ref 0.0–0.1)
Basophils Relative: 1 %
Eosinophils Absolute: 0.1 10*3/uL (ref 0.0–0.5)
Eosinophils Relative: 2 %
HCT: 42.3 % (ref 34.8–46.6)
Hemoglobin: 14.1 g/dL (ref 11.6–15.9)
Lymphocytes Relative: 29 %
Lymphs Abs: 1.1 10*3/uL (ref 0.9–3.3)
MCH: 33 pg (ref 25.1–34.0)
MCHC: 33.3 g/dL (ref 31.5–36.0)
MCV: 99.1 fL (ref 79.5–101.0)
Monocytes Absolute: 0.3 10*3/uL (ref 0.1–0.9)
Monocytes Relative: 7 %
Neutro Abs: 2.3 10*3/uL (ref 1.5–6.5)
Neutrophils Relative %: 61 %
Platelets: 169 10*3/uL (ref 145–400)
RBC: 4.27 MIL/uL (ref 3.70–5.45)
RDW: 12.4 % (ref 11.2–16.1)
WBC: 3.8 10*3/uL — ABNORMAL LOW (ref 3.9–10.3)

## 2017-12-20 MED ORDER — HEPARIN SOD (PORK) LOCK FLUSH 100 UNIT/ML IV SOLN
500.0000 [IU] | Freq: Once | INTRAVENOUS | Status: AC | PRN
  Administered 2017-12-20: 500 [IU]
  Filled 2017-12-20: qty 5

## 2017-12-20 MED ORDER — SODIUM CHLORIDE 0.9% FLUSH
10.0000 mL | INTRAVENOUS | Status: DC | PRN
  Administered 2017-12-20: 10 mL
  Filled 2017-12-20: qty 10

## 2017-12-20 MED ORDER — ACETAMINOPHEN 325 MG PO TABS: 650.0000 mg | ORAL_TABLET | Freq: Once | ORAL | Status: DC

## 2017-12-20 MED ORDER — TRASTUZUMAB CHEMO 150 MG IV SOLR
450.0000 mg | Freq: Once | INTRAVENOUS | Status: AC
  Administered 2017-12-20: 450 mg via INTRAVENOUS
  Filled 2017-12-20: qty 21.43

## 2017-12-20 MED ORDER — DENOSUMAB 120 MG/1.7ML ~~LOC~~ SOLN
120.0000 mg | Freq: Once | SUBCUTANEOUS | Status: AC
  Administered 2017-12-20: 120 mg via SUBCUTANEOUS

## 2017-12-20 MED ORDER — DENOSUMAB 120 MG/1.7ML ~~LOC~~ SOLN
SUBCUTANEOUS | Status: AC
  Filled 2017-12-20: qty 1.7

## 2017-12-20 MED ORDER — DIPHENHYDRAMINE HCL 25 MG PO CAPS: 25.0000 mg | ORAL_CAPSULE | Freq: Once | ORAL | Status: DC

## 2017-12-20 MED ORDER — CYCLOBENZAPRINE HCL 10 MG PO TABS: 10.0000 mg | ORAL_TABLET | Freq: Two times a day (BID) | ORAL | 3 refills | Status: DC | PRN

## 2017-12-20 MED ORDER — SODIUM CHLORIDE 0.9 % IV SOLN
Freq: Once | INTRAVENOUS | Status: AC
  Administered 2017-12-20: 13:00:00 via INTRAVENOUS

## 2017-12-20 NOTE — Patient Instructions (Signed)
[facility] Discharge Instructions for Patients Receiving Chemotherapy  Today you received the following chemotherapy agents Herceptin  To help prevent nausea and vomiting after your treatment, we encourage you to take your nausea medication as directed   If you develop nausea and vomiting that is not controlled by your nausea medication, call the clinic.   BELOW ARE SYMPTOMS THAT SHOULD BE REPORTED IMMEDIATELY:  *FEVER GREATER THAN 100.5 F  *CHILLS WITH OR WITHOUT FEVER  NAUSEA AND VOMITING THAT IS NOT CONTROLLED WITH YOUR NAUSEA MEDICATION  *UNUSUAL SHORTNESS OF BREATH  *UNUSUAL BRUISING OR BLEEDING  TENDERNESS IN MOUTH AND THROAT WITH OR WITHOUT PRESENCE OF ULCERS  *URINARY PROBLEMS  *BOWEL PROBLEMS  UNUSUAL RASH Items with * indicate a potential emergency and should be followed up as soon as possible.  Feel free to call the clinic should you have any questions or concerns. The clinic phone number is [phone].  Please show the CHEMO ALERT CARD at check-in to the Emergency Department and triage nurse.   

## 2017-12-20 NOTE — Telephone Encounter (Signed)
Scheduled appts per 1/21 los - patient will get updated avs in infusion.

## 2017-12-21 LAB — CANCER ANTIGEN 27.29: CA 27.29: 13.5 U/mL (ref 0.0–38.6)

## 2017-12-22 ENCOUNTER — Ambulatory Visit: Payer: Medicaid Other | Admitting: Physical Therapy

## 2017-12-22 DIAGNOSIS — I972 Postmastectomy lymphedema syndrome: Secondary | ICD-10-CM | POA: Diagnosis not present

## 2017-12-22 DIAGNOSIS — M25612 Stiffness of left shoulder, not elsewhere classified: Secondary | ICD-10-CM

## 2017-12-22 DIAGNOSIS — M79602 Pain in left arm: Secondary | ICD-10-CM

## 2017-12-22 DIAGNOSIS — R293 Abnormal posture: Secondary | ICD-10-CM

## 2017-12-22 NOTE — Patient Instructions (Signed)
Deep Effective Breath   Standing, sitting, or laying down, place both hands on the belly. Take a deep breath IN, expanding the belly; then breath OUT, contracting the belly. Repeat __5__ times. Do __2-3__ sessions per day and before your self massage.  http://gt2.exer.us/866   Copyright  VHI. All rights reserved.  Axilla to Axilla - Pumpp   On uninvolved side make 5 circles in the armpit, then pump _5__ times from involved armpit across chest to uninvolved armpit, making a pathway. Do _1__ time per day.  Copyright  VHI. All rights reserved.  Axilla to Inguinal Nodes - Pump   On involved side, make 5 circles at groin at panty line, then pump _5__ times from armpit along side of trunk to outer hip, making your other pathway. Do __1_ time per day.  Copyright  VHI. All rights reserved.  Arm Posterior: Elbow to Shoulder - Pump   Pump _5__ times from back of elbow to top of shoulder. Then inner to outer upper arm _5_ times, then outer arm again _5_ times. Then back to the pathways _2-3_ times. Do _1__ time per day.  Copyright  VHI. All rights reserved.  ARM: [place] Wrist to Elbow - Pump   Pump or stationary circles _5__ times from wrist to elbow making sure to do both sides of the forearm. Then retrace your steps to the outer arm, and the pathways _2-3_ times each. Do _1__ time per day.  Copyright  VHI. All rights reserved.  ARM: Dorsum of Hand to Shoulder - Pump   Pump or stationary circles _5__ times on back of hand including knuckle spaces and individual fingers if needed working up towards the wrist, then retrace all your steps working back up the forearm, doing both sides; upper outer arm and back to your pathways _2-3_ times each. Then do 5 circles again at uninvolved armpit and involved groin where you started! Good job!! Do __1_ time per day.  Copyright  VHI. All rights reserved.

## 2017-12-22 NOTE — Therapy (Signed)
[place], [place], [address] Phone: [phone]   Fax:  [phone]  Physical Therapy Treatment  Patient Details  Name: [name] MRN: [id number] Date of Birth: 1961/03/28 Referring Provider: [name], PA-C   Encounter Date: 12/22/2017  PT End of Session - 12/22/17 2041    Visit Number  4    Number of Visits  16    Date for PT Re-Evaluation  01/26/18    PT Start Time  1346    PT Stop Time  1433    PT Time Calculation (min)  47 min    Activity Tolerance  Patient tolerated treatment well    Behavior During Therapy  [facility] for tasks assessed/performed       Past Medical History:  Diagnosis Date  . Alcohol abuse   . Anemia    during chemo  . Anxiety    At age 89  . Arthritis Dx 2010  . Bipolar disorder ([place])   . Cancer ([place])    breast mets to brain  . Chronic pain   . Complication of anesthesia   . Depression   . Family history of adverse reaction to anesthesia    parents had PONV  . Fibromyalgia Dx 2005  . GERD (gastroesophageal reflux disease)   . Headache    hx  migraines  . Lymphedema of left arm   . Opiate dependence ([place])   . PONV (postoperative nausea and vomiting)   . Port-A-Cath in place   . PTSD (post-traumatic stress disorder)     Past Surgical History:  Procedure Laterality Date  . APPLICATION OF CRANIAL NAVIGATION N/A 08/14/2016   Procedure: APPLICATION OF CRANIAL NAVIGATION;  Surgeon: Erline Levine, MD;  Location: [place] [facility];  Service: Neurosurgery;  Laterality: N/A;  . BREAST RECONSTRUCTION Left    with silicone implant  . CRANIOTOMY N/A 08/14/2016   Procedure: CRANIOTOMY TUMOR EXCISION WITH Lucky Rathke;  Surgeon: Erline Levine, MD;  Location: [place] [facility];  Service: Neurosurgery;  Laterality: N/A;  . FIBULA FRACTURE SURGERY Left   . MASTECTOMY Left   . RADIOLOGY WITH ANESTHESIA N/A 07/23/2016   Procedure: MRI OF BRAIN WITH AND WITHOUT;  Surgeon: Medication  Radiologist, MD;  Location: [facility];  Service: Radiology;  Laterality: N/A;  . RADIOLOGY WITH ANESTHESIA N/A 09/08/2016   Procedure: MRI OF BRAIN WITH AND WITHOUT CONTRAST;  Surgeon: Medication Radiologist, MD;  Location: [facility];  Service: Radiology;  Laterality: N/A;  . RADIOLOGY WITH ANESTHESIA N/A 12/10/2016   Procedure: MRI OF BRAIN WITH AND WITHOUT;  Surgeon: Medication Radiologist, MD;  Location: [facility];  Service: Radiology;  Laterality: N/A;  . RADIOLOGY WITH ANESTHESIA N/A 03/02/2017   Procedure: MRI of BRAIN W and W/OUT CONTRAST;  Surgeon: Medication Radiologist, MD;  Location: [facility];  Service: Radiology;  Laterality: N/A;  . RADIOLOGY WITH ANESTHESIA N/A 07/29/2017   Procedure: RADIOLOGY WITH ANESTHESIA MRI OF BRAIN WITH AND WITHOUT CONTRAST;  Surgeon: Radiologist, Medication, MD;  Location: [facility];  Service: Radiology;  Laterality: N/A;  . RADIOLOGY WITH ANESTHESIA N/A 12/07/2017   Procedure: MRI WITH ANESTHESIA OF BRAIN WITH AND WITHOUT CONTRAST;  Surgeon: Radiologist, Medication, MD;  Location: [facility];  Service: Radiology;  Laterality: N/A;  . right power port placement Right     There were no vitals filed for this visit.  Subjective Assessment - 12/22/17 1349    Subjective  "I'm so swollen.  My clothes don't fit."  Saw Dr. [name] on Monday  and he said it could be a whole lot worse.    Pertinent History  Left mastectomy and sentinel node biopsy 08/21/15 and then axillary dissection following that which showed 14/15 positive nodes. Then chemotherapy and radiation after breast reconstruction. Brain tumor removed 08/14/16 which was metastatic disease followed by full brain radiation. Surgery and chemo were done in [place]. 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 Currently she continues with Xgeva and Herceptin and reports her disease is stable.    Currently in Pain?  Yes    Pain Score  10-Worst pain ever for tightness in the arm     Pain Location  Arm    Pain Orientation  Left    Pain Descriptors / Indicators  Tightness    Aggravating Factors   more swelling    Pain Relieving Factors  less swelling                      [facility] Adult PT Treatment/Exercise - 12/22/17 0001      Manual Therapy   Manual Lymphatic Drainage (MLD)  Instructed patient in the following and had her perform it with supervision and cueing:  diaphragmatic breathing x 5, supraclavicular fossae, right axilla and anterior interaxillary anastomosis, left groin and axillo-inguinal anatomosis, and left UE from shoulder to dorsal hand, then retracing steps.  After this, therapist performed the same to reinforce proper technique to patient.             PT Education - 12/22/17 2040    Education provided  Yes    Education Details  manual lymph drainage    Person(s) Educated  Patient    Methods  Explanation;Demonstration;Tactile cues;Verbal cues;Handout    Comprehension  Verbalized understanding;Returned demonstration       PT Short Term Goals - 12/22/17 2048      PT SHORT TERM GOAL #1   Title  Patient will be able to verbalize understanding of the treatment for lymphedema and ways to reduce her risk of exaccerbation of swelling.    Status  Partially Met      PT SHORT TERM GOAL #2   Title  Reduce left arm circumference at 10 cm proximal to her olecranon to </= 28 cm.    Status  Deferred      PT SHORT TERM GOAL #3   Title  Reduce left arm circumference at 15 cm proximal to her ulnar styloid process to </= 24 cm.    Status  Deferred        PT Long Term Goals - 12/22/17 2049      PT LONG TERM GOAL #1   Title  Reduce left arm circumference at 10 cm proximal to her olecranon to </= 25 cm.    Status  Deferred      PT LONG TERM GOAL #2   Title  Reduce left arm circumference at 15 cm proximal to her ulnar styloid process to </= 23 cm.    Status  Deferred      PT LONG TERM GOAL #3   Title  Patient will be able to verbalize  understanding of where and how to be fitted for a compression sleeve and glove.    Status  Achieved      PT LONG TERM GOAL #4   Title  Patient will demonstrate proper technique for performing self manual lymph drainage to continue treatment into the Maintenance Phase.  Status  Revised            Plan - 12/22/17 2041    Clinical Impression Statement  Pt. had stopped in here a few days to have [place] look at her arm; she also asked a couple of other providers about it, because she was concerned about possible ongoing infection or other problem.  She was reassured that this simply looked like lymphedema.  Today her arm did appear larger than when this therapist treated her a week ago today.  She did well learning how to do self-manual lymph drainage, though did need ongoing cueing and did have (the usual) tendency to use more pressure than needed. We agreed on the plan to change frequency coming up from the original 3x/week plan to 2x/week, since we are not going to be doing bandaging.  Pt. did say that she has been measured for day- and nighttime compression garments as well as a pump through [employer]. She requested explanation of what the pump does.    Rehab Potential  Excellent    Clinical Impairments Affecting Rehab Potential  None    PT Frequency  2x / week    PT Duration  4 weeks    PT Treatment/Interventions  ADLs/Self Care Home Management;Therapeutic exercise;Passive range of motion;Manual lymph drainage;Compression bandaging;Manual techniques;Therapeutic activities;DME Instruction    PT Next Visit Plan  Continue manual lymph drainage and instructing patient toward goal of making sure she is independent in doing this properly. Check on status of her compression garments.    Consulted and Agree with Plan of Care  Patient       Patient will benefit from skilled therapeutic intervention in order to improve the following deficits and impairments:  Decreased knowledge of use of DME, Pain,  Postural dysfunction, Decreased scar mobility, Decreased range of motion, Impaired UE functional use, Increased edema  Visit Diagnosis: Postmastectomy lymphedema  Abnormal posture  Stiffness of left shoulder, not elsewhere classified  Pain in left arm     Problem List Patient Active Problem List   Diagnosis Date Noted  . GERD (gastroesophageal reflux disease) 12/10/2017  . Edema 11/09/2017  . Headache 05/21/2017  . Lithium overdose 04/26/2017  . Acute encephalopathy 03/16/2017  . Acute lower UTI   . Hypokalemia 02/18/2017  . Gait abnormality 01/06/2017  . Bipolar I disorder, most recent episode depressed ([place]) 12/08/2016  . Adjustment disorder with anxiety 12/06/2016  . Delirium due to another medical condition 12/03/2016  . Confusion 12/03/2016  . Diarrhea 12/03/2016  . Metastatic breast cancer ([place])   . Cerumen impaction 11/25/2016  . Otitis media 11/06/2016  . Brain metastasis ([place]) 07/27/2016  . Iron deficiency anemia 06/26/2016  . Bone metastases ([place]) 06/03/2016  . Primary cancer of lower-inner quadrant of left female breast ([place]) 06/01/2016  . Pap smear for cervical cancer screening 03/28/2015  . Current smoker 03/28/2015  . Healthcare maintenance 03/28/2015  . Seasonal allergies 03/28/2015  . Anxiety state 02/28/2015  . Fibromyalgia 02/28/2015  . Family history of diabetes mellitus 02/28/2015  . H/O alcohol abuse     Donnel Venuto 12/22/2017, 8:50 PM  [place], [place], [address] Phone: [phone]   Fax:  [phone]  Name: [name] MRN: [id number] Date of Birth: 1961-03-01  Serafina Royals, PT 12/22/17 8:51 PM

## 2017-12-28 ENCOUNTER — Encounter: Payer: Self-pay | Admitting: *Deleted

## 2017-12-31 ENCOUNTER — Ambulatory Visit: Payer: Medicaid Other | Attending: Oncology | Admitting: Physical Therapy

## 2017-12-31 ENCOUNTER — Other Ambulatory Visit: Payer: Self-pay | Admitting: Oncology

## 2017-12-31 ENCOUNTER — Encounter: Payer: Self-pay | Admitting: Physical Therapy

## 2017-12-31 DIAGNOSIS — I972 Postmastectomy lymphedema syndrome: Secondary | ICD-10-CM | POA: Diagnosis present

## 2017-12-31 DIAGNOSIS — R293 Abnormal posture: Secondary | ICD-10-CM | POA: Insufficient documentation

## 2017-12-31 NOTE — Therapy (Signed)
[place], [place], [address] Phone: [phone]   Fax:  [phone]  Physical Therapy Treatment  Patient Details  Name: [name] MRN: [id number] Date of Birth: 1961/02/19 Referring Provider: [name], PA-C   Encounter Date: 12/31/2017  PT End of Session - 12/31/17 1147    Visit Number  5    Number of Visits  16    Date for PT Re-Evaluation  01/26/18    PT Start Time  1106    PT Stop Time  1145    PT Time Calculation (min)  39 min    Activity Tolerance  Patient tolerated treatment well    Behavior During Therapy  [facility] for tasks assessed/performed       Past Medical History:  Diagnosis Date  . Alcohol abuse   . Anemia    during chemo  . Anxiety    At age 57  . Arthritis Dx 2010  . Bipolar disorder ([place])   . Cancer ([place])    breast mets to brain  . Chronic pain   . Complication of anesthesia   . Depression   . Family history of adverse reaction to anesthesia    parents had PONV  . Fibromyalgia Dx 2005  . GERD (gastroesophageal reflux disease)   . Headache    hx  migraines  . Lymphedema of left arm   . Opiate dependence ([place])   . PONV (postoperative nausea and vomiting)   . Port-A-Cath in place   . PTSD (post-traumatic stress disorder)     Past Surgical History:  Procedure Laterality Date  . APPLICATION OF CRANIAL NAVIGATION N/A 08/14/2016   Procedure: APPLICATION OF CRANIAL NAVIGATION;  Surgeon: Erline Levine, MD;  Location: [facility];  Service: Neurosurgery;  Laterality: N/A;  . BREAST RECONSTRUCTION Left    with silicone implant  . CRANIOTOMY N/A 08/14/2016   Procedure: CRANIOTOMY TUMOR EXCISION WITH Lucky Rathke;  Surgeon: Erline Levine, MD;  Location: [facility];  Service: Neurosurgery;  Laterality: N/A;  . FIBULA FRACTURE SURGERY Left   . MASTECTOMY Left   . RADIOLOGY WITH ANESTHESIA N/A 07/23/2016   Procedure: MRI OF BRAIN WITH AND WITHOUT;  Surgeon: Medication  Radiologist, MD;  Location: [facility];  Service: Radiology;  Laterality: N/A;  . RADIOLOGY WITH ANESTHESIA N/A 09/08/2016   Procedure: MRI OF BRAIN WITH AND WITHOUT CONTRAST;  Surgeon: Medication Radiologist, MD;  Location: [facility];  Service: Radiology;  Laterality: N/A;  . RADIOLOGY WITH ANESTHESIA N/A 12/10/2016   Procedure: MRI OF BRAIN WITH AND WITHOUT;  Surgeon: Medication Radiologist, MD;  Location: [facility];  Service: Radiology;  Laterality: N/A;  . RADIOLOGY WITH ANESTHESIA N/A 03/02/2017   Procedure: MRI of BRAIN W and W/OUT CONTRAST;  Surgeon: Medication Radiologist, MD;  Location: [facility];  Service: Radiology;  Laterality: N/A;  . RADIOLOGY WITH ANESTHESIA N/A 07/29/2017   Procedure: RADIOLOGY WITH ANESTHESIA MRI OF BRAIN WITH AND WITHOUT CONTRAST;  Surgeon: Radiologist, Medication, MD;  Location: [facility];  Service: Radiology;  Laterality: N/A;  . RADIOLOGY WITH ANESTHESIA N/A 12/07/2017   Procedure: MRI WITH ANESTHESIA OF BRAIN WITH AND WITHOUT CONTRAST;  Surgeon: Radiologist, Medication, MD;  Location: [facility];  Service: Radiology;  Laterality: N/A;  . right power port placement Right     There were no vitals filed for this visit.  Subjective Assessment - 12/31/17 1108    Subjective  My arm is the same. I am not getting any relief. The lady that  ordered the sleeve and pump was supposed to call Wednesday. I still havent' heard from her.     Pertinent History  Left mastectomy and sentinel node biopsy 08/21/15 and then axillary dissection following that which showed 14/15 positive nodes. Then chemotherapy and radiation after breast reconstruction. Brain tumor removed 08/14/16 which was metastatic disease followed by full brain radiation. Surgery and chemo were done in [place]. 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 Currently she continues with Xgeva and Herceptin and reports her disease is stable.    Patient Stated Goals  reduce arm swelling                       [facility] Adult PT Treatment/Exercise - 12/31/17 0001      Manual Therapy   Manual Lymphatic Drainage (MLD)  In supine, short neck, superficial and deep abdomen, right axilla and anterior interaxillary anastomosis, left groin and axillo-inguinal anastomosis, and left UE from dorsal hand to shoulder.               PT Short Term Goals - 12/22/17 2048      PT SHORT TERM GOAL #1   Title  Patient will be able to verbalize understanding of the treatment for lymphedema and ways to reduce her risk of exaccerbation of swelling.    Status  Partially Met      PT SHORT TERM GOAL #2   Title  Reduce left arm circumference at 10 cm proximal to her olecranon to </= 28 cm.    Status  Deferred      PT SHORT TERM GOAL #3   Title  Reduce left arm circumference at 15 cm proximal to her ulnar styloid process to </= 24 cm.    Status  Deferred        PT Long Term Goals - 12/22/17 2049      PT LONG TERM GOAL #1   Title  Reduce left arm circumference at 10 cm proximal to her olecranon to </= 25 cm.    Status  Deferred      PT LONG TERM GOAL #2   Title  Reduce left arm circumference at 15 cm proximal to her ulnar styloid process to </= 23 cm.    Status  Deferred      PT LONG TERM GOAL #3   Title  Patient will be able to verbalize understanding of where and how to be fitted for a compression sleeve and glove.    Status  Achieved      PT LONG TERM GOAL #4   Title  Patient will demonstrate proper technique for performing self manual lymph drainage to continue treatment into the Maintenance Phase.    Status  Revised            Plan - 12/31/17 1147    Clinical Impression Statement  Pt called [facility] today and Tye [place] stated she would try to get either the pump or her garments to her by Friday. Continued with MLD today. Pt states she has not been getting any relief from doing the massage herself. Pt did have softening of fibrosis today and pt stated it felt  better.     Rehab Potential  Excellent    Clinical Impairments Affecting Rehab Potential  None    PT Frequency  2x / week    PT Duration  4 weeks    PT Treatment/Interventions  ADLs/Self Care Home  Management;Therapeutic exercise;Passive range of motion;Manual lymph drainage;Compression bandaging;Manual techniques;Therapeutic activities;DME Instruction    PT Next Visit Plan  Continue manual lymph drainage and instructing patient toward goal of making sure she is independent in doing this properly. Check on status of her compression garments.    Consulted and Agree with Plan of Care  Patient       Patient will benefit from skilled therapeutic intervention in order to improve the following deficits and impairments:  Decreased knowledge of use of DME, Pain, Postural dysfunction, Decreased scar mobility, Decreased range of motion, Impaired UE functional use, Increased edema  Visit Diagnosis: Postmastectomy lymphedema     Problem List Patient Active Problem List   Diagnosis Date Noted  . GERD (gastroesophageal reflux disease) 12/10/2017  . Edema 11/09/2017  . Headache 05/21/2017  . Lithium overdose 04/26/2017  . Acute encephalopathy 03/16/2017  . Acute lower UTI   . Hypokalemia 02/18/2017  . Gait abnormality 01/06/2017  . Bipolar I disorder, most recent episode depressed ([place]) 12/08/2016  . Adjustment disorder with anxiety 12/06/2016  . Delirium due to another medical condition 12/03/2016  . Confusion 12/03/2016  . Diarrhea 12/03/2016  . Metastatic breast cancer ([place])   . Cerumen impaction 11/25/2016  . Otitis media 11/06/2016  . Brain metastasis ([place]) 07/27/2016  . Iron deficiency anemia 06/26/2016  . Bone metastases ([place]) 06/03/2016  . Primary cancer of lower-inner quadrant of left female breast ([place]) 06/01/2016  . Pap smear for cervical cancer screening 03/28/2015  . Current smoker 03/28/2015  . Healthcare maintenance 03/28/2015  . Seasonal allergies 03/28/2015  . Anxiety  state 02/28/2015  . Fibromyalgia 02/28/2015  . Family history of diabetes mellitus 02/28/2015  . H/O alcohol abuse     Allyson Sabal [facility] 12/31/2017, 11:50 AM  [place], [place], [address] Phone: [phone]   Fax:  [phone]  Name: Kiernan Farkas MRN: [id number] Date of Birth: 1961-03-14  Manus Gunning, PT 12/31/17 11:50 AM

## 2018-01-03 ENCOUNTER — Ambulatory Visit: Payer: Medicaid Other | Admitting: Physical Therapy

## 2018-01-03 DIAGNOSIS — I972 Postmastectomy lymphedema syndrome: Secondary | ICD-10-CM | POA: Diagnosis not present

## 2018-01-03 NOTE — Therapy (Signed)
[place], [place], [address] Phone: [phone]   Fax:  [phone]  Physical Therapy Treatment  Patient Details  Name: [name] MRN: [id number] Date of Birth: Jun 01, 1961 Referring Provider: [name], PA-C   Encounter Date: 01/03/2018  PT End of Session - 01/03/18 1543    Visit Number  6    Number of Visits  16    Date for PT Re-Evaluation  01/26/18    Authorization Type  Medicaid    Authorization Time Period  to 01/27/18    Authorization - Number of Visits  11 total    PT Start Time  1302    PT Stop Time  1344    PT Time Calculation (min)  42 min    Activity Tolerance  Patient tolerated treatment well    Behavior During Therapy  [facility] for tasks assessed/performed       Past Medical History:  Diagnosis Date  . Alcohol abuse   . Anemia    during chemo  . Anxiety    At age 57  . Arthritis Dx 2010  . Bipolar disorder ([place])   . Cancer ([place])    breast mets to brain  . Chronic pain   . Complication of anesthesia   . Depression   . Family history of adverse reaction to anesthesia    parents had PONV  . Fibromyalgia Dx 2005  . GERD (gastroesophageal reflux disease)   . Headache    hx  migraines  . Lymphedema of left arm   . Opiate dependence ([place])   . PONV (postoperative nausea and vomiting)   . Port-A-Cath in place   . PTSD (post-traumatic stress disorder)     Past Surgical History:  Procedure Laterality Date  . APPLICATION OF CRANIAL NAVIGATION N/A 08/14/2016   Procedure: APPLICATION OF CRANIAL NAVIGATION;  Surgeon: Erline Levine, MD;  Location: [place] [facility];  Service: Neurosurgery;  Laterality: N/A;  . BREAST RECONSTRUCTION Left    with silicone implant  . CRANIOTOMY N/A 08/14/2016   Procedure: CRANIOTOMY TUMOR EXCISION WITH Lucky Rathke;  Surgeon: Erline Levine, MD;  Location: [facility];  Service: Neurosurgery;  Laterality: N/A;  . FIBULA FRACTURE SURGERY Left   . MASTECTOMY  Left   . RADIOLOGY WITH ANESTHESIA N/A 07/23/2016   Procedure: MRI OF BRAIN WITH AND WITHOUT;  Surgeon: Medication Radiologist, MD;  Location: [place];  Service: Radiology;  Laterality: N/A;  . RADIOLOGY WITH ANESTHESIA N/A 09/08/2016   Procedure: MRI OF BRAIN WITH AND WITHOUT CONTRAST;  Surgeon: Medication Radiologist, MD;  Location: [place];  Service: Radiology;  Laterality: N/A;  . RADIOLOGY WITH ANESTHESIA N/A 12/10/2016   Procedure: MRI OF BRAIN WITH AND WITHOUT;  Surgeon: Medication Radiologist, MD;  Location: [place];  Service: Radiology;  Laterality: N/A;  . RADIOLOGY WITH ANESTHESIA N/A 03/02/2017   Procedure: MRI of BRAIN W and W/OUT CONTRAST;  Surgeon: Medication Radiologist, MD;  Location: [facility] [place];  Service: Radiology;  Laterality: N/A;  . RADIOLOGY WITH ANESTHESIA N/A 07/29/2017   Procedure: RADIOLOGY WITH ANESTHESIA MRI OF BRAIN WITH AND WITHOUT CONTRAST;  Surgeon: Radiologist, Medication, MD;  Location: [place];  Service: Radiology;  Laterality: N/A;  . RADIOLOGY WITH ANESTHESIA N/A 12/07/2017   Procedure: MRI WITH ANESTHESIA OF BRAIN WITH AND WITHOUT CONTRAST;  Surgeon: Radiologist, Medication, MD;  Location: [place];  Service: Radiology;  Laterality: N/A;  . right power port placement Right     There were no vitals filed for  this visit.  Subjective Assessment - 01/03/18 1305    Subjective  "I'm supposed to get with [facility] either the end of this week or this weekend and get those things--both compression garments and the pump." "I'm ready to have you work on this." "I don't get any relief doing it myself at home; Hilaria Ota got it soft on Friday."    Pertinent History  Left mastectomy and sentinel node biopsy 08/21/15 and then axillary dissection following that which showed 14/15 positive nodes. Then chemotherapy and radiation after breast reconstruction. Brain tumor removed 08/14/16 which was metastatic disease followed by full brain radiation. Surgery and chemo were done in [name]. She reports bipolar  illness and is a recovering alcoholic and drug addict and has been clean for 5 years. Currently she continues with Xgeva and Herceptin and reports her disease is stable.    Currently in Pain?  No/denies    Pain Location  Arm    Pain Orientation  Left    Pain Descriptors / Indicators  Tightness                      [facility] Adult PT Treatment/Exercise - 01/03/18 0001      Manual Therapy   Manual Lymphatic Drainage (MLD)  In supine, short neck, superficial and deep abdomen, right axilla and anterior interaxillary anastomosis, left groin and axillo-inguinal anastomosis, and left UE from dorsal hand to shoulder; then in right sidelying, posterior interaxillary anastomosis left to right and left axillo-inguinal anastomosis.               PT Short Term Goals - 12/22/17 2048      PT SHORT TERM GOAL #1   Title  Patient will be able to verbalize understanding of the treatment for lymphedema and ways to reduce her risk of exaccerbation of swelling.    Status  Partially Met      PT SHORT TERM GOAL #2   Title  Reduce left arm circumference at 10 cm proximal to her olecranon to </= 28 cm.    Status  Deferred      PT SHORT TERM GOAL #3   Title  Reduce left arm circumference at 15 cm proximal to her ulnar styloid process to </= 24 cm.    Status  Deferred        PT Long Term Goals - 12/22/17 2049      PT LONG TERM GOAL #1   Title  Reduce left arm circumference at 10 cm proximal to her olecranon to </= 25 cm.    Status  Deferred      PT LONG TERM GOAL #2   Title  Reduce left arm circumference at 15 cm proximal to her ulnar styloid process to </= 23 cm.    Status  Deferred      PT LONG TERM GOAL #3   Title  Patient will be able to verbalize understanding of where and how to be fitted for a compression sleeve and glove.    Status  Achieved      PT LONG TERM GOAL #4   Title  Patient will demonstrate proper technique for performing self manual lymph drainage to continue  treatment into the Maintenance Phase.    Status  Revised            Plan - 01/03/18 1550    Clinical Impression Statement  Pt. reports frustration with her arm swelling and feeling hard.  She did feel a significant softening of  tissue by the end of her session today (as well as last session).  She reports she does not get this softening when she treats herself.  She is to meet with the vendor later this week to get compression garments and pump.    Rehab Potential  Excellent    PT Frequency  2x / week    PT Duration  4 weeks    PT Treatment/Interventions  ADLs/Self Care Home Management;Therapeutic exercise;Passive range of motion;Manual lymph drainage;Compression bandaging;Manual techniques;Therapeutic activities;DME Instruction    PT Next Visit Plan  Continue manual lymph drainage and instructing patient toward goal of making sure she is independent in doing this properly.     Consulted and Agree with Plan of Care  Patient       Patient will benefit from skilled therapeutic intervention in order to improve the following deficits and impairments:  Decreased knowledge of use of DME, Pain, Postural dysfunction, Decreased scar mobility, Decreased range of motion, Impaired UE functional use, Increased edema  Visit Diagnosis: Postmastectomy lymphedema     Problem List Patient Active Problem List   Diagnosis Date Noted  . GERD (gastroesophageal reflux disease) 12/10/2017  . Edema 11/09/2017  . Headache 05/21/2017  . Lithium overdose 04/26/2017  . Acute encephalopathy 03/16/2017  . Acute lower UTI   . Hypokalemia 02/18/2017  . Gait abnormality 01/06/2017  . Bipolar I disorder, most recent episode depressed ([place]) 12/08/2016  . Adjustment disorder with anxiety 12/06/2016  . Delirium due to another medical condition 12/03/2016  . Confusion 12/03/2016  . Diarrhea 12/03/2016  . Metastatic breast cancer ([place])   . Cerumen impaction 11/25/2016  . Otitis media 11/06/2016  . Brain  metastasis ([place]) 07/27/2016  . Iron deficiency anemia 06/26/2016  . Bone metastases ([place]) 06/03/2016  . Primary cancer of lower-inner quadrant of left female breast ([place]) 06/01/2016  . Pap smear for cervical cancer screening 03/28/2015  . Current smoker 03/28/2015  . Healthcare maintenance 03/28/2015  . Seasonal allergies 03/28/2015  . Anxiety state 02/28/2015  . Fibromyalgia 02/28/2015  . Family history of diabetes mellitus 02/28/2015  . H/O alcohol abuse     Brittani Purdum 01/03/2018, 3:52 PM  [place], [place], [address] Phone: [phone]   Fax:  [phone]  Name: Brennah Quraishi MRN: [id number] Date of Birth: 1961-09-27  Serafina Royals, PT 01/03/18 3:52 PM

## 2018-01-05 ENCOUNTER — Encounter: Payer: Self-pay | Admitting: Physical Therapy

## 2018-01-06 ENCOUNTER — Other Ambulatory Visit: Payer: Self-pay | Admitting: Radiation Therapy

## 2018-01-06 DIAGNOSIS — C7949 Secondary malignant neoplasm of other parts of nervous system: Secondary | ICD-10-CM

## 2018-01-06 DIAGNOSIS — C7931 Secondary malignant neoplasm of brain: Secondary | ICD-10-CM

## 2018-01-07 ENCOUNTER — Ambulatory Visit: Payer: Medicaid Other | Admitting: Physical Therapy

## 2018-01-07 DIAGNOSIS — I972 Postmastectomy lymphedema syndrome: Secondary | ICD-10-CM | POA: Diagnosis not present

## 2018-01-07 NOTE — Therapy (Signed)
[place], [place], [address] Phone: [phone]   Fax:  [phone]  Physical Therapy Treatment  Patient Details  Name: [name] MRN: [id number] Date of Birth: 09-10-1961 Referring Provider: [name], PA-C   Encounter Date: 01/07/2018  PT End of Session - 01/07/18 1212    Visit Number  7    Number of Visits  16    Date for PT Re-Evaluation  01/26/18    Authorization Type  Medicaid    Authorization Time Period  to 01/27/18    Authorization - Number of Visits  11 total    PT Start Time  3614 pt. got appt. time mixed up    PT Stop Time  1019    PT Time Calculation (min)  34 min    Activity Tolerance  Patient tolerated treatment well    Behavior During Therapy  [facility] for tasks assessed/performed       Past Medical History:  Diagnosis Date  . Alcohol abuse   . Anemia    during chemo  . Anxiety    At age 44  . Arthritis Dx 2010  . Bipolar disorder ([place])   . Cancer ([name])    breast mets to brain  . Chronic pain   . Complication of anesthesia   . Depression   . Family history of adverse reaction to anesthesia    parents had PONV  . Fibromyalgia Dx 2005  . GERD (gastroesophageal reflux disease)   . Headache    hx  migraines  . Lymphedema of left arm   . Opiate dependence ([place])   . PONV (postoperative nausea and vomiting)   . Port-A-Cath in place   . PTSD (post-traumatic stress disorder)     Past Surgical History:  Procedure Laterality Date  . APPLICATION OF CRANIAL NAVIGATION N/A 08/14/2016   Procedure: APPLICATION OF CRANIAL NAVIGATION;  Surgeon: Erline Levine, MD;  Location: [facility];  Service: Neurosurgery;  Laterality: N/A;  . BREAST RECONSTRUCTION Left    with silicone implant  . CRANIOTOMY N/A 08/14/2016   Procedure: CRANIOTOMY TUMOR EXCISION WITH Lucky Rathke;  Surgeon: Erline Levine, MD;  Location: [facility];  Service: Neurosurgery;  Laterality: N/A;  . FIBULA FRACTURE  SURGERY Left   . MASTECTOMY Left   . RADIOLOGY WITH ANESTHESIA N/A 07/23/2016   Procedure: MRI OF BRAIN WITH AND WITHOUT;  Surgeon: Medication Radiologist, MD;  Location: [facility];  Service: Radiology;  Laterality: N/A;  . RADIOLOGY WITH ANESTHESIA N/A 09/08/2016   Procedure: MRI OF BRAIN WITH AND WITHOUT CONTRAST;  Surgeon: Medication Radiologist, MD;  Location: [facility];  Service: Radiology;  Laterality: N/A;  . RADIOLOGY WITH ANESTHESIA N/A 12/10/2016   Procedure: MRI OF BRAIN WITH AND WITHOUT;  Surgeon: Medication Radiologist, MD;  Location: [facility];  Service: Radiology;  Laterality: N/A;  . RADIOLOGY WITH ANESTHESIA N/A 03/02/2017   Procedure: MRI of BRAIN W and W/OUT CONTRAST;  Surgeon: Medication Radiologist, MD;  Location: [facility];  Service: Radiology;  Laterality: N/A;  . RADIOLOGY WITH ANESTHESIA N/A 07/29/2017   Procedure: RADIOLOGY WITH ANESTHESIA MRI OF BRAIN WITH AND WITHOUT CONTRAST;  Surgeon: Radiologist, Medication, MD;  Location: [facility];  Service: Radiology;  Laterality: N/A;  . RADIOLOGY WITH ANESTHESIA N/A 12/07/2017   Procedure: MRI WITH ANESTHESIA OF BRAIN WITH AND WITHOUT CONTRAST;  Surgeon: Radiologist, Medication, MD;  Location: [facility];  Service: Radiology;  Laterality: N/A;  . right power port placement Right  There were no vitals filed for this visit.  Subjective Assessment - 01/07/18 0947    Subjective  Hasn't heard from [facility] yet.  "Oh, it's awful," about the arm--still feels hard.    Pertinent History  Left mastectomy and sentinel node biopsy 08/21/15 and then axillary dissection following that which showed 14/15 positive nodes. Then chemotherapy and radiation after breast reconstruction. Brain tumor removed 08/14/16 which was metastatic disease followed by full brain radiation. Surgery and chemo were done in [place]. 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 Currently she continues with Xgeva and Herceptin and  reports her disease is stable.    Currently in Pain?  No/denies                      [facility] Adult PT Treatment/Exercise - 01/07/18 0001      Manual Therapy   Manual Lymphatic Drainage (MLD)  In supine, short neck, superficial and deep abdomen, right axilla and anterior interaxillary anastomosis, left groin and axillo-inguinal anastomosis, and left UE from fingers to shoulder; then in right sidelying, posterior interaxillary anastomosis left to right and left axillo-inguinal anastomosis.               PT Short Term Goals - 12/22/17 2048      PT SHORT TERM GOAL #1   Title  Patient will be able to verbalize understanding of the treatment for lymphedema and ways to reduce her risk of exaccerbation of swelling.    Status  Partially Met      PT SHORT TERM GOAL #2   Title  Reduce left arm circumference at 10 cm proximal to her olecranon to </= 28 cm.    Status  Deferred      PT SHORT TERM GOAL #3   Title  Reduce left arm circumference at 15 cm proximal to her ulnar styloid process to </= 24 cm.    Status  Deferred        PT Long Term Goals - 12/22/17 2049      PT LONG TERM GOAL #1   Title  Reduce left arm circumference at 10 cm proximal to her olecranon to </= 25 cm.    Status  Deferred      PT LONG TERM GOAL #2   Title  Reduce left arm circumference at 15 cm proximal to her ulnar styloid process to </= 23 cm.    Status  Deferred      PT LONG TERM GOAL #3   Title  Patient will be able to verbalize understanding of where and how to be fitted for a compression sleeve and glove.    Status  Achieved      PT LONG TERM GOAL #4   Title  Patient will demonstrate proper technique for performing self manual lymph drainage to continue treatment into the Maintenance Phase.    Status  Revised            Plan - 01/07/18 1213    Clinical Impression Statement  Pt.'s arm felt a little less indurated today, though still firm. She should receive her garments and pump  on 01/10/18.    Rehab Potential  Excellent    Clinical Impairments Affecting Rehab Potential  None    PT Frequency  2x / week    PT Duration  4 weeks    PT Treatment/Interventions  ADLs/Self Care Home Management;Therapeutic exercise;Passive range of motion;Manual lymph  drainage;Compression bandaging;Manual techniques;Therapeutic activities;DME Instruction    PT Next Visit Plan  Continue manual lymph drainage and instructing patient toward goal of making sure she is independent in doing this properly.     Consulted and Agree with Plan of Care  Patient       Patient will benefit from skilled therapeutic intervention in order to improve the following deficits and impairments:  Decreased knowledge of use of DME, Pain, Postural dysfunction, Decreased scar mobility, Decreased range of motion, Impaired UE functional use, Increased edema  Visit Diagnosis: Postmastectomy lymphedema     Problem List Patient Active Problem List   Diagnosis Date Noted  . GERD (gastroesophageal reflux disease) 12/10/2017  . Edema 11/09/2017  . Headache 05/21/2017  . Lithium overdose 04/26/2017  . Acute encephalopathy 03/16/2017  . Acute lower UTI   . Hypokalemia 02/18/2017  . Gait abnormality 01/06/2017  . Bipolar I disorder, most recent episode depressed ([place]) 12/08/2016  . Adjustment disorder with anxiety 12/06/2016  . Delirium due to another medical condition 12/03/2016  . Confusion 12/03/2016  . Diarrhea 12/03/2016  . Metastatic breast cancer ([place])   . Cerumen impaction 11/25/2016  . Otitis media 11/06/2016  . Brain metastasis ([place]) 07/27/2016  . Iron deficiency anemia 06/26/2016  . Bone metastases ([place]) 06/03/2016  . Primary cancer of lower-inner quadrant of left female breast ([place]) 06/01/2016  . Pap smear for cervical cancer screening 03/28/2015  . Current smoker 03/28/2015  . Healthcare maintenance 03/28/2015  . Seasonal allergies 03/28/2015  . Anxiety state 02/28/2015  . Fibromyalgia  02/28/2015  . Family history of diabetes mellitus 02/28/2015  . H/O alcohol abuse     Nashua Homewood 01/07/2018, 12:15 PM  [place], [place], [address] Phone: [phone]   Fax:  [phone]  Name: [name] MRN: [id number] Date of Birth: 11-Dec-1960  Serafina Royals, PT 01/07/18 12:15 PM

## 2018-01-10 ENCOUNTER — Ambulatory Visit: Payer: Medicaid Other | Admitting: Physical Therapy

## 2018-01-10 DIAGNOSIS — I972 Postmastectomy lymphedema syndrome: Secondary | ICD-10-CM | POA: Diagnosis not present

## 2018-01-10 NOTE — Therapy (Signed)
[place], [place], [address] Phone: [phone]   Fax:  [phone]  Physical Therapy Treatment  Patient Details  Name: [name] MRN: [id number] Date of Birth: Jan 05, 1961 Referring Provider: [name], PA-C   Encounter Date: 01/10/2018  PT End of Session - 01/10/18 1713    Visit Number  8    Number of Visits  11    Date for PT Re-Evaluation  01/26/18    Authorization Type  Medicaid    Authorization Time Period  to 01/27/18    Authorization - Number of Visits  11    PT Start Time  1301    PT Stop Time  1345    PT Time Calculation (min)  44 min    Activity Tolerance  Patient tolerated treatment well    Behavior During Therapy  [facility] for tasks assessed/performed       Past Medical History:  Diagnosis Date  . Alcohol abuse   . Anemia    during chemo  . Anxiety    At age 31  . Arthritis Dx 2010  . Bipolar disorder ([place])   . Cancer ([place])    breast mets to brain  . Chronic pain   . Complication of anesthesia   . Depression   . Family history of adverse reaction to anesthesia    parents had PONV  . Fibromyalgia Dx 2005  . GERD (gastroesophageal reflux disease)   . Headache    hx  migraines  . Lymphedema of left arm   . Opiate dependence ([place])   . PONV (postoperative nausea and vomiting)   . Port-A-Cath in place   . PTSD (post-traumatic stress disorder)     Past Surgical History:  Procedure Laterality Date  . APPLICATION OF CRANIAL NAVIGATION N/A 08/14/2016   Procedure: APPLICATION OF CRANIAL NAVIGATION;  Surgeon: Erline Levine, MD;  Location: [place] [facility];  Service: Neurosurgery;  Laterality: N/A;  . BREAST RECONSTRUCTION Left    with silicone implant  . CRANIOTOMY N/A 08/14/2016   Procedure: CRANIOTOMY TUMOR EXCISION WITH Lucky Rathke;  Surgeon: Erline Levine, MD;  Location: [place] [facility];  Service: Neurosurgery;  Laterality: N/A;  . FIBULA FRACTURE SURGERY Left   . MASTECTOMY Left    . RADIOLOGY WITH ANESTHESIA N/A 07/23/2016   Procedure: MRI OF BRAIN WITH AND WITHOUT;  Surgeon: Medication Radiologist, MD;  Location: [facility];  Service: Radiology;  Laterality: N/A;  . RADIOLOGY WITH ANESTHESIA N/A 09/08/2016   Procedure: MRI OF BRAIN WITH AND WITHOUT CONTRAST;  Surgeon: Medication Radiologist, MD;  Location: [place];  Service: Radiology;  Laterality: N/A;  . RADIOLOGY WITH ANESTHESIA N/A 12/10/2016   Procedure: MRI OF BRAIN WITH AND WITHOUT;  Surgeon: Medication Radiologist, MD;  Location: [facility];  Service: Radiology;  Laterality: N/A;  . RADIOLOGY WITH ANESTHESIA N/A 03/02/2017   Procedure: MRI of BRAIN W and W/OUT CONTRAST;  Surgeon: Medication Radiologist, MD;  Location: [place];  Service: Radiology;  Laterality: N/A;  . RADIOLOGY WITH ANESTHESIA N/A 07/29/2017   Procedure: RADIOLOGY WITH ANESTHESIA MRI OF BRAIN WITH AND WITHOUT CONTRAST;  Surgeon: Radiologist, Medication, MD;  Location: [facility];  Service: Radiology;  Laterality: N/A;  . RADIOLOGY WITH ANESTHESIA N/A 12/07/2017   Procedure: MRI WITH ANESTHESIA OF BRAIN WITH AND WITHOUT CONTRAST;  Surgeon: Radiologist, Medication, MD;  Location: [place];  Service: Radiology;  Laterality: N/A;  . right power port placement Right     There were no vitals filed for this  visit.  Subjective Assessment - 01/10/18 1304    Subjective  Arm is bothering her.  Should get her garments and pump tomorrow. The arm hurt this weekend--maybe it's the way I slept.    Pertinent History  Left mastectomy and sentinel node biopsy 08/21/15 and then axillary dissection following that which showed 14/15 positive nodes. Then chemotherapy and radiation after breast reconstruction. Brain tumor removed 08/14/16 which was metastatic disease followed by full brain radiation. Surgery and chemo were done in [place]. 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 Currently she continues with Xgeva and Herceptin and  reports her disease is stable.    Currently in Pain?  No/denies            LYMPHEDEMA/ONCOLOGY QUESTIONNAIRE - 01/10/18 1341      Left Upper Extremity Lymphedema   10 cm Proximal to Olecranon Process  33.8 cm    10 cm Proximal to Ulnar Styloid Process  22.9 cm    Across Hand at [employer]  17.9 cm    Other  after manual lymph drainage               [facility] Adult PT Treatment/Exercise - 01/10/18 0001      Manual Therapy   Manual Therapy  Edema management    Edema Management  circumference measurements taken both before and, at some levels, after treatment today    Manual Lymphatic Drainage (MLD)  In supine, short neck, superficial and deep abdomen, right axilla and anterior interaxillary anastomosis, left groin and axillo-inguinal anastomosis, and left UE from fingers to shoulder; then in right sidelying, posterior interaxillary anastomosis left to right and left axillo-inguinal anastomosis.               PT Short Term Goals - 01/10/18 1716      PT SHORT TERM GOAL #1   Title  Patient will be able to verbalize understanding of the treatment for lymphedema and ways to reduce her risk of exacerbation of swelling.    Status  Partially Met        PT Long Term Goals - 12/22/17 2049      PT LONG TERM GOAL #1   Title  Reduce left arm circumference at 10 cm proximal to her olecranon to </= 25 cm.    Status  Deferred      PT LONG TERM GOAL #2   Title  Reduce left arm circumference at 15 cm proximal to her ulnar styloid process to </= 23 cm.    Status  Deferred      PT LONG TERM GOAL #3   Title  Patient will be able to verbalize understanding of where and how to be fitted for a compression sleeve and glove.    Status  Achieved      PT LONG TERM GOAL #4   Title  Patient will demonstrate proper technique for performing self manual lymph drainage to continue treatment into the Maintenance Phase.    Status  Revised            Plan - 01/10/18 1714     Clinical Impression Statement  Pt.'s arm measurements were increased today, and it was evident by the fact that it felt firmer and looked larger than on last visit.  Circumference measurements were taken both before and after manual lymph drainage, and patient did have some reductions during the course of treatment.  She is to receive  her garments and pump tomorrow.    Rehab Potential  Excellent    Clinical Impairments Affecting Rehab Potential  None    PT Frequency  2x / week    PT Duration  4 weeks    PT Treatment/Interventions  ADLs/Self Care Home Management;Therapeutic exercise;Passive range of motion;Manual lymph drainage;Compression bandaging;Manual techniques;Therapeutic activities;DME Instruction    PT Next Visit Plan  Continue manual lymph drainage and instructing patient toward goal of making sure she is independent in doing this properly.     Consulted and Agree with Plan of Care  Patient       Patient will benefit from skilled therapeutic intervention in order to improve the following deficits and impairments:  Decreased knowledge of use of DME, Pain, Postural dysfunction, Decreased scar mobility, Decreased range of motion, Impaired UE functional use, Increased edema  Visit Diagnosis: Postmastectomy lymphedema     Problem List Patient Active Problem List   Diagnosis Date Noted  . GERD (gastroesophageal reflux disease) 12/10/2017  . Edema 11/09/2017  . Headache 05/21/2017  . Lithium overdose 04/26/2017  . Acute encephalopathy 03/16/2017  . Acute lower UTI   . Hypokalemia 02/18/2017  . Gait abnormality 01/06/2017  . Bipolar I disorder, most recent episode depressed ([place]) 12/08/2016  . Adjustment disorder with anxiety 12/06/2016  . Delirium due to another medical condition 12/03/2016  . Confusion 12/03/2016  . Diarrhea 12/03/2016  . Metastatic breast cancer ([place])   . Cerumen impaction 11/25/2016  . Otitis media 11/06/2016  . Brain metastasis ([place]) 07/27/2016  . Iron  deficiency anemia 06/26/2016  . Bone metastases ([place]) 06/03/2016  . Primary cancer of lower-inner quadrant of left female breast ([place]) 06/01/2016  . Pap smear for cervical cancer screening 03/28/2015  . Current smoker 03/28/2015  . Healthcare maintenance 03/28/2015  . Seasonal allergies 03/28/2015  . Anxiety state 02/28/2015  . Fibromyalgia 02/28/2015  . Family history of diabetes mellitus 02/28/2015  . H/O alcohol abuse     Munachimso Palin 01/10/2018, 5:17 PM  [place], [place], [address] Phone: [phone]   Fax:  [phone]  Name: [name] MRN: [id number] Date of Birth: August 30, 1961  Serafina Royals, PT 01/10/18 5:17 PM

## 2018-01-12 ENCOUNTER — Encounter: Payer: Self-pay | Admitting: Physical Therapy

## 2018-01-14 ENCOUNTER — Ambulatory Visit: Payer: Medicaid Other | Admitting: Physical Therapy

## 2018-01-14 DIAGNOSIS — I972 Postmastectomy lymphedema syndrome: Secondary | ICD-10-CM

## 2018-01-14 NOTE — Therapy (Signed)
[place], [place], [address] Phone: [phone]   Fax:  [phone]  Physical Therapy Treatment  Patient Details  Name: [name] MRN: [id number] Date of Birth: 11-08-1961 Referring Provider: [name], PA-C   Encounter Date: 01/14/2018  PT End of Session - 01/14/18 0852    Visit Number  9    Number of Visits  11    Date for PT Re-Evaluation  01/26/18    PT Start Time  0759    PT Stop Time  0845    PT Time Calculation (min)  46 min    Activity Tolerance  Patient tolerated treatment well    Behavior During Therapy  [facility] for tasks assessed/performed       Past Medical History:  Diagnosis Date  . Alcohol abuse   . Anemia    during chemo  . Anxiety    At age 22  . Arthritis Dx 2010  . Bipolar disorder ([place])   . Cancer ([place])    breast mets to brain  . Chronic pain   . Complication of anesthesia   . Depression   . Family history of adverse reaction to anesthesia    parents had PONV  . Fibromyalgia Dx 2005  . GERD (gastroesophageal reflux disease)   . Headache    hx  migraines  . Lymphedema of left arm   . Opiate dependence ([place])   . PONV (postoperative nausea and vomiting)   . Port-A-Cath in place   . PTSD (post-traumatic stress disorder)     Past Surgical History:  Procedure Laterality Date  . APPLICATION OF CRANIAL NAVIGATION N/A 08/14/2016   Procedure: APPLICATION OF CRANIAL NAVIGATION;  Surgeon: Erline Levine, MD;  Location: [place] [facility];  Service: Neurosurgery;  Laterality: N/A;  . BREAST RECONSTRUCTION Left    with silicone implant  . CRANIOTOMY N/A 08/14/2016   Procedure: CRANIOTOMY TUMOR EXCISION WITH Lucky Rathke;  Surgeon: Erline Levine, MD;  Location: [place] [facility];  Service: Neurosurgery;  Laterality: N/A;  . FIBULA FRACTURE SURGERY Left   . MASTECTOMY Left   . RADIOLOGY WITH ANESTHESIA N/A 07/23/2016   Procedure: MRI OF BRAIN WITH AND WITHOUT;  Surgeon: Medication  Radiologist, MD;  Location: [place];  Service: Radiology;  Laterality: N/A;  . RADIOLOGY WITH ANESTHESIA N/A 09/08/2016   Procedure: MRI OF BRAIN WITH AND WITHOUT CONTRAST;  Surgeon: Medication Radiologist, MD;  Location: [facility];  Service: Radiology;  Laterality: N/A;  . RADIOLOGY WITH ANESTHESIA N/A 12/10/2016   Procedure: MRI OF BRAIN WITH AND WITHOUT;  Surgeon: Medication Radiologist, MD;  Location: [place];  Service: Radiology;  Laterality: N/A;  . RADIOLOGY WITH ANESTHESIA N/A 03/02/2017   Procedure: MRI of BRAIN W and W/OUT CONTRAST;  Surgeon: Medication Radiologist, MD;  Location: [facility];  Service: Radiology;  Laterality: N/A;  . RADIOLOGY WITH ANESTHESIA N/A 07/29/2017   Procedure: RADIOLOGY WITH ANESTHESIA MRI OF BRAIN WITH AND WITHOUT CONTRAST;  Surgeon: Radiologist, Medication, MD;  Location: [facility];  Service: Radiology;  Laterality: N/A;  . RADIOLOGY WITH ANESTHESIA N/A 12/07/2017   Procedure: MRI WITH ANESTHESIA OF BRAIN WITH AND WITHOUT CONTRAST;  Surgeon: Radiologist, Medication, MD;  Location: [facility] [place];  Service: Radiology;  Laterality: N/A;  . right power port placement Right     There were no vitals filed for this visit.  Subjective Assessment - 01/14/18 0759    Subjective  You're going to be blown away. Look at this (upper arm tissue is soft).  My hand still wants to puff up.  I have a loaner pump right now, but it's not working. I brought  my sleeve and glove to have you help me put it on. Having trouble getting her sleeve on, but has a roommate who can help.    Pertinent History  Left mastectomy and sentinel node biopsy 08/21/15 and then axillary dissection following that which showed 14/15 positive nodes. Then chemotherapy and radiation after breast reconstruction. Brain tumor removed 08/14/16 which was metastatic disease followed by full brain radiation. Surgery and chemo were done in [place]. She reports bipolar illness and is a recovering alcoholic and drug addict and has been clean for 5  years. Currently she continues with Xgeva and Herceptin and reports her disease is stable.    Currently in Pain?  No/denies                      [facility] Adult PT Treatment/Exercise - 01/14/18 0001      Manual Therapy   Edema Management  assisted patient with donning compression sleeve.  It is a snug fit and difficult to get on; she can do the glove herself.    Manual Lymphatic Drainage (MLD)  In supine, short neck, superficial and deep abdomen, right axilla and anterior interaxillary anastomosis, left groin and axillo-inguinal anastomosis, and left UE from fingers to shoulder; then in right sidelying, posterior interaxillary anastomosis left to right and left axillo-inguinal anastomosis.               PT Short Term Goals - 01/10/18 1716      PT SHORT TERM GOAL #1   Title  Patient will be able to verbalize understanding of the treatment for lymphedema and ways to reduce her risk of exacerbation of swelling.    Status  Partially Met        PT Long Term Goals - 12/22/17 2049      PT LONG TERM GOAL #1   Title  Reduce left arm circumference at 10 cm proximal to her olecranon to </= 25 cm.    Status  Deferred      PT LONG TERM GOAL #2   Title  Reduce left arm circumference at 15 cm proximal to her ulnar styloid process to </= 23 cm.    Status  Deferred      PT LONG TERM GOAL #3   Title  Patient will be able to verbalize understanding of where and how to be fitted for a compression sleeve and glove.    Status  Achieved      PT LONG TERM GOAL #4   Title  Patient will demonstrate proper technique for performing self manual lymph drainage to continue treatment into the Maintenance Phase.    Status  Revised            Plan - 01/14/18 5465    Clinical Impression Statement  Pt. has received her Elvarex sleeve and glove, which fit well but the sleeve is difficult to don, even with help.  She also got her nighttime garment, which sounds by her description like it  is a Quarry manager; she will bring it in next week.  She c/o left hand puffing up, so suggested she make the hand strap of the Reidsleeve snugger for now.  She has a loaner pump which only works for five minutes; it is a Materials engineer because Medicaid hasn't authorized hers yet.    Rehab Potential  Excellent    Clinical  Impairments Affecting Rehab Potential  None    PT Frequency  2x / week    PT Duration  4 weeks    PT Treatment/Interventions  ADLs/Self Care Home Management;Therapeutic exercise;Passive range of motion;Manual lymph drainage;Compression bandaging;Manual techniques;Therapeutic activities;DME Instruction    PT Next Visit Plan  Check Reidsleeve. Remeasure. Continue manual lymph drainage and instructing patient toward goal of making sure she is independent in doing this properly.     Consulted and Agree with Plan of Care  Patient       Patient will benefit from skilled therapeutic intervention in order to improve the following deficits and impairments:  Decreased knowledge of use of DME, Pain, Postural dysfunction, Decreased scar mobility, Decreased range of motion, Impaired UE functional use, Increased edema  Visit Diagnosis: Postmastectomy lymphedema     Problem List Patient Active Problem List   Diagnosis Date Noted  . GERD (gastroesophageal reflux disease) 12/10/2017  . Edema 11/09/2017  . Headache 05/21/2017  . Lithium overdose 04/26/2017  . Acute encephalopathy 03/16/2017  . Acute lower UTI   . Hypokalemia 02/18/2017  . Gait abnormality 01/06/2017  . Bipolar I disorder, most recent episode depressed ([place]) 12/08/2016  . Adjustment disorder with anxiety 12/06/2016  . Delirium due to another medical condition 12/03/2016  . Confusion 12/03/2016  . Diarrhea 12/03/2016  . Metastatic breast cancer ([place])   . Cerumen impaction 11/25/2016  . Otitis media 11/06/2016  . Brain metastasis ([place]) 07/27/2016  . Iron deficiency anemia 06/26/2016  . Bone metastases ([place])  06/03/2016  . Primary cancer of lower-inner quadrant of left female breast ([place]) 06/01/2016  . Pap smear for cervical cancer screening 03/28/2015  . Current smoker 03/28/2015  . Healthcare maintenance 03/28/2015  . Seasonal allergies 03/28/2015  . Anxiety state 02/28/2015  . Fibromyalgia 02/28/2015  . Family history of diabetes mellitus 02/28/2015  . H/O alcohol abuse     Rodrecus Belsky 01/14/2018, 8:55 AM  [place], [place], [address] Phone: [phone]   Fax:  [phone]  Name: [name] MRN: [id number] Date of Birth: 11-28-1961  Serafina Royals, PT 01/14/18 8:55 AM

## 2018-01-17 ENCOUNTER — Ambulatory Visit: Payer: Medicaid Other | Admitting: Physical Therapy

## 2018-01-17 DIAGNOSIS — I972 Postmastectomy lymphedema syndrome: Secondary | ICD-10-CM

## 2018-01-17 DIAGNOSIS — R293 Abnormal posture: Secondary | ICD-10-CM

## 2018-01-17 NOTE — Therapy (Signed)
[place], [place], [address] Phone: [phone]   Fax:  [phone]  Physical Therapy Treatment  Patient Details  Name: [name] MRN: [id number] Date of Birth: 18-Jun-1961 Referring Provider: [name], PA-C   Encounter Date: 01/17/2018  PT End of Session - 01/17/18 2014    Visit Number  10    Number of Visits  15    Date for PT Re-Evaluation  02/18/18    Authorization Type  Medicaid    Authorization Time Period  to 01/27/18    Authorization - Number of Visits  11    PT Start Time  1301    PT Stop Time  1344    PT Time Calculation (min)  43 min    Activity Tolerance  Patient tolerated treatment well    Behavior During Therapy  [facility] for tasks assessed/performed       Past Medical History:  Diagnosis Date  . Alcohol abuse   . Anemia    during chemo  . Anxiety    At age 72  . Arthritis Dx 2010  . Bipolar disorder ([place])   . Cancer ([place])    breast mets to brain  . Chronic pain   . Complication of anesthesia   . Depression   . Family history of adverse reaction to anesthesia    parents had PONV  . Fibromyalgia Dx 2005  . GERD (gastroesophageal reflux disease)   . Headache    hx  migraines  . Lymphedema of left arm   . Opiate dependence ([place])   . PONV (postoperative nausea and vomiting)   . Port-A-Cath in place   . PTSD (post-traumatic stress disorder)     Past Surgical History:  Procedure Laterality Date  . APPLICATION OF CRANIAL NAVIGATION N/A 08/14/2016   Procedure: APPLICATION OF CRANIAL NAVIGATION;  Surgeon: Erline Levine, MD;  Location: [facility];  Service: Neurosurgery;  Laterality: N/A;  . BREAST RECONSTRUCTION Left    with silicone implant  . CRANIOTOMY N/A 08/14/2016   Procedure: CRANIOTOMY TUMOR EXCISION WITH Lucky Rathke;  Surgeon: Erline Levine, MD;  Location: [facility];  Service: Neurosurgery;  Laterality: N/A;  . FIBULA FRACTURE SURGERY Left   . MASTECTOMY Left    . RADIOLOGY WITH ANESTHESIA N/A 07/23/2016   Procedure: MRI OF BRAIN WITH AND WITHOUT;  Surgeon: Medication Radiologist, MD;  Location: [facility];  Service: Radiology;  Laterality: N/A;  . RADIOLOGY WITH ANESTHESIA N/A 09/08/2016   Procedure: MRI OF BRAIN WITH AND WITHOUT CONTRAST;  Surgeon: Medication Radiologist, MD;  Location: [facility];  Service: Radiology;  Laterality: N/A;  . RADIOLOGY WITH ANESTHESIA N/A 12/10/2016   Procedure: MRI OF BRAIN WITH AND WITHOUT;  Surgeon: Medication Radiologist, MD;  Location: [facility];  Service: Radiology;  Laterality: N/A;  . RADIOLOGY WITH ANESTHESIA N/A 03/02/2017   Procedure: MRI of BRAIN W and W/OUT CONTRAST;  Surgeon: Medication Radiologist, MD;  Location: [facility];  Service: Radiology;  Laterality: N/A;  . RADIOLOGY WITH ANESTHESIA N/A 07/29/2017   Procedure: RADIOLOGY WITH ANESTHESIA MRI OF BRAIN WITH AND WITHOUT CONTRAST;  Surgeon: Radiologist, Medication, MD;  Location: [facility];  Service: Radiology;  Laterality: N/A;  . RADIOLOGY WITH ANESTHESIA N/A 12/07/2017   Procedure: MRI WITH ANESTHESIA OF BRAIN WITH AND WITHOUT CONTRAST;  Surgeon: Radiologist, Medication, MD;  Location: [facility];  Service: Radiology;  Laterality: N/A;  . right power port placement Right     There were no vitals filed for this  visit.  Subjective Assessment - 01/17/18 1303    Subjective  My hand  is still puffy.  I tried making the Reidsleeve snugger, but it doesn't help my hand or fingers.  "It doesn't seem to be near as tight" overall.    Pertinent History  Left mastectomy and sentinel node biopsy 08/21/15 and then axillary dissection following that which showed 14/15 positive nodes. Then chemotherapy and radiation after breast reconstruction. Brain tumor removed 08/14/16 which was metastatic disease followed by full brain radiation. Surgery and chemo were done in [place]. 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 Currently she continues  with Xgeva and Herceptin and reports her disease is stable.    Currently in Pain?  No/denies            LYMPHEDEMA/ONCOLOGY QUESTIONNAIRE - 01/17/18 1323      Left Upper Extremity Lymphedema   15 cm Proximal to Olecranon Process  32.2 cm    10 cm Proximal to Olecranon Process  31.6 cm    Olecranon Process  26.7 cm    15 cm Proximal to Ulnar Styloid Process  26.8 cm    10 cm Proximal to Ulnar Styloid Process  23.5 cm    Just Proximal to Ulnar Styloid Process  15.8 cm    Across Hand at [employer]  17.9 cm    At [place] of 2nd Digit  5.6 cm    Other  measured after manual lymph drainage had just been started today               [facility] Adult PT Treatment/Exercise - 01/17/18 0001      Self-Care   Self-Care  Other Self-Care Comments    Other Self-Care Comments   Checked fit of Reidsleeve and tightened hand straps a little, since she reports her hand has been puffy of late after Reidsleeve on just a few mins., had skin impressions      Manual Therapy   Edema Management  circumference measurements taken    Manual Lymphatic Drainage (MLD)  In supine, short neck, superficial and deep abdomen, right axilla and anterior interaxillary anastomosis, left groin and axillo-inguinal anastomosis, and left UE from fingers to shoulder; then in right sidelying, posterior interaxillary anastomosis left to right and left axillo-inguinal anastomosis.               PT Short Term Goals - 01/17/18 2021      PT SHORT TERM GOAL #1   Title  Patient will be able to verbalize understanding of the treatment for lymphedema and ways to reduce her risk of exacerbation of swelling.    Status  Achieved      PT SHORT TERM GOAL #2   Title  Reduce left arm circumference at 10 cm proximal to her olecranon to </= 28 cm.    Status  Deferred      PT SHORT TERM GOAL #3   Title  Reduce left arm circumference at 15 cm proximal to her ulnar styloid process to </= 24 cm.    Status  Deferred         PT Long Term Goals - 01/17/18 2023      PT LONG TERM GOAL #1   Title  Reduce left arm circumference at 10 cm proximal to her olecranon to </= 25 cm.    Status  Deferred      PT LONG TERM GOAL #2   Title  Reduce left arm circumference at 15 cm proximal to her ulnar styloid process to </= 23 cm.    Status  Deferred      PT LONG TERM GOAL #3   Title  Patient will be able to verbalize understanding of where and how to be fitted for a compression sleeve and glove.    Status  Achieved      PT LONG TERM GOAL #4   Status  Revised            Plan - 01/17/18 2016    Clinical Impression Statement  Patient's Reidsleeve appears to fit well and she has shown slight circumference reductions since using that and her daytime compression garments; however, her dorsal hand looks puffy.  Reidsleeve was tightened at the hand today to see if that would help; may consider trying Komprex pad in her compression glove next session if tightening the Reidsleeve doesn't help. She will benefit from continued therapy to assist her in learning how to manage that swelling and to monitor her response to using these garments as well as a pump that she may receive on 01/19/18.    Rehab Potential  Excellent    Clinical Impairments Affecting Rehab Potential  None    PT Frequency  2x / week    PT Duration  4 weeks    PT Treatment/Interventions  ADLs/Self Care Home Management;Therapeutic exercise;Passive range of motion;Manual lymph drainage;Compression bandaging;Manual techniques;Therapeutic activities;DME Instruction    PT Next Visit Plan  Assess how patient's swelling is responding to use of day- and nighttime garments, and whether she received her pump. Continue manual lymph drainage and instructing patient toward goal of making sure she is independent in doing this properly.     Consulted and Agree with Plan of Care  Patient       Patient will benefit from skilled therapeutic intervention in order to improve  the following deficits and impairments:  Decreased knowledge of use of DME, Pain, Postural dysfunction, Decreased scar mobility, Decreased range of motion, Impaired UE functional use, Increased edema  Visit Diagnosis: Postmastectomy lymphedema - Plan: PT plan of care cert/re-cert  Abnormal posture - Plan: PT plan of care cert/re-cert     Problem List Patient Active Problem List   Diagnosis Date Noted  . GERD (gastroesophageal reflux disease) 12/10/2017  . Edema 11/09/2017  . Headache 05/21/2017  . Lithium overdose 04/26/2017  . Acute encephalopathy 03/16/2017  . Acute lower UTI   . Hypokalemia 02/18/2017  . Gait abnormality 01/06/2017  . Bipolar I disorder, most recent episode depressed ([place]) 12/08/2016  . Adjustment disorder with anxiety 12/06/2016  . Delirium due to another medical condition 12/03/2016  . Confusion 12/03/2016  . Diarrhea 12/03/2016  . Metastatic breast cancer ([place])   . Cerumen impaction 11/25/2016  . Otitis media 11/06/2016  . Brain metastasis ([place]) 07/27/2016  . Iron deficiency anemia 06/26/2016  . Bone metastases ([place]) 06/03/2016  . Primary cancer of lower-inner quadrant of left female breast ([place]) 06/01/2016  . Pap smear for cervical cancer screening 03/28/2015  . Current smoker 03/28/2015  . Healthcare maintenance 03/28/2015  . Seasonal allergies 03/28/2015  . Anxiety state 02/28/2015  . Fibromyalgia 02/28/2015  . Family history of diabetes mellitus 02/28/2015  . H/O alcohol abuse     Andre Gallego 01/17/2018, 8:28 PM  [place], [place], [address] Phone: [phone]   Fax:  [phone]  Name: [name] MRN: [id number] Date of Birth: Jul 10, 1961  Butch Penny  [place], [place] 01/17/18 8:28 PM

## 2018-01-18 ENCOUNTER — Inpatient Hospital Stay: Payer: Medicaid Other

## 2018-01-18 ENCOUNTER — Inpatient Hospital Stay: Payer: Medicaid Other | Attending: Medical

## 2018-01-18 VITALS — BP 119/60 | HR 82 | Temp 98.1°F | Resp 18

## 2018-01-18 DIAGNOSIS — C7931 Secondary malignant neoplasm of brain: Secondary | ICD-10-CM | POA: Insufficient documentation

## 2018-01-18 DIAGNOSIS — C50312 Malignant neoplasm of lower-inner quadrant of left female breast: Secondary | ICD-10-CM

## 2018-01-18 DIAGNOSIS — Z5112 Encounter for antineoplastic immunotherapy: Secondary | ICD-10-CM | POA: Insufficient documentation

## 2018-01-18 DIAGNOSIS — Z5111 Encounter for antineoplastic chemotherapy: Secondary | ICD-10-CM | POA: Diagnosis present

## 2018-01-18 DIAGNOSIS — C7951 Secondary malignant neoplasm of bone: Secondary | ICD-10-CM | POA: Diagnosis not present

## 2018-01-18 LAB — COMPREHENSIVE METABOLIC PANEL
ALT: 20 U/L (ref 0–55)
AST: 21 U/L (ref 5–34)
Albumin: 3.6 g/dL (ref 3.5–5.0)
Alkaline Phosphatase: 48 U/L (ref 40–150)
Anion gap: 7 (ref 3–11)
BUN: 12 mg/dL (ref 7–26)
CO2: 28 mmol/L (ref 22–29)
Calcium: 9.2 mg/dL (ref 8.4–10.4)
Chloride: 105 mmol/L (ref 98–109)
Creatinine, Ser: 0.9 mg/dL (ref 0.60–1.10)
GFR calc Af Amer: >60 mL/min (ref >60)
GFR calc non Af Amer: >60 mL/min (ref >60)
Glucose, Bld: 92 mg/dL (ref 70–140)
Potassium: 4.1 mmol/L (ref 3.5–5.1)
Sodium: 140 mmol/L (ref 136–145)
Total Bilirubin: <0.2 mg/dL — ABNORMAL LOW (ref 0.2–1.2)
Total Protein: 6.1 g/dL — ABNORMAL LOW (ref 6.4–8.3)

## 2018-01-18 LAB — CBC WITH DIFFERENTIAL/PLATELET
Basophils Absolute: 0 10*3/uL (ref 0.0–0.1)
Basophils Relative: 1 %
Eosinophils Absolute: 0.1 10*3/uL (ref 0.0–0.5)
Eosinophils Relative: 2 %
HCT: 38 % (ref 34.8–46.6)
Hemoglobin: 12.8 g/dL (ref 11.6–15.9)
Lymphocytes Relative: 23 %
Lymphs Abs: 0.9 10*3/uL (ref 0.9–3.3)
MCH: 32.8 pg (ref 25.1–34.0)
MCHC: 33.7 g/dL (ref 31.5–36.0)
MCV: 97.4 fL (ref 79.5–101.0)
Monocytes Absolute: 0.4 10*3/uL (ref 0.1–0.9)
Monocytes Relative: 10 %
Neutro Abs: 2.5 10*3/uL (ref 1.5–6.5)
Neutrophils Relative %: 64 %
Platelets: 198 10*3/uL (ref 145–400)
RBC: 3.9 MIL/uL (ref 3.70–5.45)
RDW: 11.9 % (ref 11.2–14.5)
WBC: 3.9 10*3/uL (ref 3.9–10.3)

## 2018-01-18 MED ORDER — SODIUM CHLORIDE 0.9 % IV SOLN
Freq: Once | INTRAVENOUS | Status: AC
  Administered 2018-01-18: 09:00:00 via INTRAVENOUS

## 2018-01-18 MED ORDER — DENOSUMAB 120 MG/1.7ML ~~LOC~~ SOLN
120.0000 mg | Freq: Once | SUBCUTANEOUS | Status: AC
  Administered 2018-01-18: 120 mg via SUBCUTANEOUS

## 2018-01-18 MED ORDER — DIPHENHYDRAMINE HCL 25 MG PO CAPS: 25.0000 mg | ORAL_CAPSULE | Freq: Once | ORAL | Status: DC

## 2018-01-18 MED ORDER — HEPARIN SOD (PORK) LOCK FLUSH 100 UNIT/ML IV SOLN
500.0000 [IU] | Freq: Once | INTRAVENOUS | Status: AC | PRN
  Administered 2018-01-18: 500 [IU]
  Filled 2018-01-18: qty 5

## 2018-01-18 MED ORDER — TRASTUZUMAB CHEMO 150 MG IV SOLR
450.0000 mg | Freq: Once | INTRAVENOUS | Status: AC
  Administered 2018-01-18: 450 mg via INTRAVENOUS
  Filled 2018-01-18: qty 21.4

## 2018-01-18 MED ORDER — DENOSUMAB 120 MG/1.7ML ~~LOC~~ SOLN
SUBCUTANEOUS | Status: AC
  Filled 2018-01-18: qty 1.7

## 2018-01-18 MED ORDER — ACETAMINOPHEN 325 MG PO TABS: 650.0000 mg | ORAL_TABLET | Freq: Once | ORAL | Status: DC

## 2018-01-18 MED ORDER — SODIUM CHLORIDE 0.9% FLUSH
10.0000 mL | INTRAVENOUS | Status: DC | PRN
  Administered 2018-01-18: 10 mL
  Filled 2018-01-18: qty 10

## 2018-01-18 NOTE — Patient Instructions (Addendum)
[facility] Discharge Instructions for Patients Receiving Chemotherapy  Today you received the following chemotherapy agents: Trastuzumab (Herceptin).  To help prevent nausea and vomiting after your treatment, we encourage you to take your nausea medication as prescribed. If you develop nausea and vomiting that is not controlled by your nausea medication, call the clinic.   BELOW ARE SYMPTOMS THAT SHOULD BE REPORTED IMMEDIATELY:  *FEVER GREATER THAN 100.5 F  *CHILLS WITH OR WITHOUT FEVER  NAUSEA AND VOMITING THAT IS NOT CONTROLLED WITH YOUR NAUSEA MEDICATION  *UNUSUAL SHORTNESS OF BREATH  *UNUSUAL BRUISING OR BLEEDING  TENDERNESS IN MOUTH AND THROAT WITH OR WITHOUT PRESENCE OF ULCERS  *URINARY PROBLEMS  *BOWEL PROBLEMS  UNUSUAL RASH Items with * indicate a potential emergency and should be followed up as soon as possible.  Feel free to call the clinic should you have any questions or concerns. The clinic phone number is [phone].  Please show the [place] at check-in to the Emergency Department and triage nurse.  Denosumab injection What is this medicine? DENOSUMAB (den oh sue mab) slows bone breakdown. Prolia is used to treat osteoporosis in women after menopause and in men. Delton See is used to treat a high calcium level due to cancer and to prevent bone fractures and other bone problems caused by multiple myeloma or cancer bone metastases. Delton See is also used to treat giant cell tumor of the bone. This medicine may be used for other purposes; ask your health care provider or pharmacist if you have questions. COMMON BRAND NAME(S): Prolia, XGEVA What should I tell my health care provider before I take this medicine? They need to know if you have any of these conditions: -dental disease -having surgery or tooth extraction -infection -kidney disease -low levels of calcium or Vitamin D in the blood -malnutrition -on hemodialysis -skin conditions or  sensitivity -thyroid or parathyroid disease -an unusual reaction to denosumab, other medicines, foods, dyes, or preservatives -pregnant or trying to get pregnant -breast-feeding How should I use this medicine? This medicine is for injection under the skin. It is given by a health care professional in a hospital or clinic setting. If you are getting Prolia, a special MedGuide will be given to you by the pharmacist with each prescription and refill. Be sure to read this information carefully each time. For Prolia, talk to your pediatrician regarding the use of this medicine in children. Special care may be needed. For Delton See, talk to your pediatrician regarding the use of this medicine in children. While this drug may be prescribed for children as young as 13 years for selected conditions, precautions do apply. Overdosage: If you think you have taken too much of this medicine contact a poison control center or emergency room at once. NOTE: This medicine is only for you. Do not share this medicine with others. What if I miss a dose? It is important not to miss your dose. Call your doctor or health care professional if you are unable to keep an appointment. What may interact with this medicine? Do not take this medicine with any of the following medications: -other medicines containing denosumab This medicine may also interact with the following medications: -medicines that lower your chance of fighting infection -steroid medicines like prednisone or cortisone This list may not describe all possible interactions. Give your health care provider a list of all the medicines, herbs, non-prescription drugs, or dietary supplements you use. Also tell them if you smoke, drink alcohol, or use illegal drugs.  Some items may interact with your medicine. What should I watch for while using this medicine? Visit your doctor or health care professional for regular checks on your progress. Your doctor or health care  professional may order blood tests and other tests to see how you are doing. Call your doctor or health care professional for advice if you get a fever, chills or sore throat, or other symptoms of a cold or flu. Do not treat yourself. This drug may decrease your body's ability to fight infection. Try to avoid being around people who are sick. You should make sure you get enough calcium and vitamin D while you are taking this medicine, unless your doctor tells you not to. Discuss the foods you eat and the vitamins you take with your health care professional. See your dentist regularly. Brush and floss your teeth as directed. Before you have any dental work done, tell your dentist you are receiving this medicine. Do not become pregnant while taking this medicine or for 5 months after stopping it. Talk with your doctor or health care professional about your birth control options while taking this medicine. Women should inform their doctor if they wish to become pregnant or think they might be pregnant. There is a potential for serious side effects to an unborn child. Talk to your health care professional or pharmacist for more information. What side effects may I notice from receiving this medicine? Side effects that you should report to your doctor or health care professional as soon as possible: -allergic reactions like skin rash, itching or hives, swelling of the face, lips, or tongue -bone pain -breathing problems -dizziness -jaw pain, especially after dental work -redness, blistering, peeling of the skin -signs and symptoms of infection like fever or chills; cough; sore throat; pain or trouble passing urine -signs of low calcium like fast heartbeat, muscle cramps or muscle pain; pain, tingling, numbness in the hands or feet; seizures -unusual bleeding or bruising -unusually weak or tired Side effects that usually do not require medical attention (report to your doctor or health care professional  if they continue or are bothersome): -constipation -diarrhea -headache -joint pain -loss of appetite -muscle pain -runny nose -tiredness -upset stomach This list may not describe all possible side effects. Call your doctor for medical advice about side effects. You may report side effects to FDA at 1-800-FDA-1088. Where should I keep my medicine? This medicine is only given in a clinic, doctor's office, or other health care setting and will not be stored at home. NOTE: This sheet is a summary. It may not cover all possible information. If you have questions about this medicine, talk to your doctor, pharmacist, or health care provider.  2018 Elsevier/Gold Standard (2016-12-08 19:17:21)

## 2018-01-19 ENCOUNTER — Encounter: Payer: Self-pay | Admitting: Physical Therapy

## 2018-01-19 LAB — CANCER ANTIGEN 27.29: CA 27.29: 8.7 U/mL (ref 0.0–38.6)

## 2018-01-21 ENCOUNTER — Ambulatory Visit: Payer: Medicaid Other | Admitting: Physical Therapy

## 2018-01-24 ENCOUNTER — Ambulatory Visit: Payer: Medicaid Other

## 2018-01-24 DIAGNOSIS — I972 Postmastectomy lymphedema syndrome: Secondary | ICD-10-CM | POA: Diagnosis not present

## 2018-01-24 NOTE — Therapy (Signed)
[facility] [place], [place], [address] Phone: [phone]   Fax:  [phone]  Physical Therapy Treatment  Patient Details  Name: [name] MRN: [id number] Date of Birth: [birth year] Referring Provider: [name], PA-C   Encounter Date: 01/24/2018  PT End of Session - 01/24/18 1219    Visit Number  11    Number of Visits  15    Date for PT Re-Evaluation  02/18/18    Authorization Type  Medicaid    Authorization Time Period  to 01/27/18    PT Start Time  1025    PT Stop Time  1107    PT Time Calculation (min)  42 min    Activity Tolerance  Patient tolerated treatment well    Behavior During Therapy  [facility] for tasks assessed/performed       Past Medical History:  Diagnosis Date  . Alcohol abuse   . Anemia    during chemo  . Anxiety    At age 59  . Arthritis Dx 2010  . Bipolar disorder ([place])   . Cancer ([place])    breast mets to brain  . Chronic pain   . Complication of anesthesia   . Depression   . Family history of adverse reaction to anesthesia    parents had PONV  . Fibromyalgia Dx 2005  . GERD (gastroesophageal reflux disease)   . Headache    hx  migraines  . Lymphedema of left arm   . Opiate dependence ([place])   . PONV (postoperative nausea and vomiting)   . Port-A-Cath in place   . PTSD (post-traumatic stress disorder)     Past Surgical History:  Procedure Laterality Date  . APPLICATION OF CRANIAL NAVIGATION N/A 08/14/2016   Procedure: APPLICATION OF CRANIAL NAVIGATION;  Surgeon: Erline Levine, MD;  Location: [place] [facility];  Service: Neurosurgery;  Laterality: N/A;  . BREAST RECONSTRUCTION Left    with silicone implant  . CRANIOTOMY N/A 08/14/2016   Procedure: CRANIOTOMY TUMOR EXCISION WITH Lucky Rathke;  Surgeon: Erline Levine, MD;  Location: [facility];  Service: Neurosurgery;  Laterality: N/A;  . FIBULA FRACTURE SURGERY Left   . MASTECTOMY Left   . RADIOLOGY WITH ANESTHESIA N/A  07/23/2016   Procedure: MRI OF BRAIN WITH AND WITHOUT;  Surgeon: Medication Radiologist, MD;  Location: [place] [place];  Service: Radiology;  Laterality: N/A;  . RADIOLOGY WITH ANESTHESIA N/A 09/08/2016   Procedure: MRI OF BRAIN WITH AND WITHOUT CONTRAST;  Surgeon: Medication Radiologist, MD;  Location: [place];  Service: Radiology;  Laterality: N/A;  . RADIOLOGY WITH ANESTHESIA N/A 12/10/2016   Procedure: MRI OF BRAIN WITH AND WITHOUT;  Surgeon: Medication Radiologist, MD;  Location: [place];  Service: Radiology;  Laterality: N/A;  . RADIOLOGY WITH ANESTHESIA N/A 03/02/2017   Procedure: MRI of BRAIN W and W/OUT CONTRAST;  Surgeon: Medication Radiologist, MD;  Location: [place];  Service: Radiology;  Laterality: N/A;  . RADIOLOGY WITH ANESTHESIA N/A 07/29/2017   Procedure: RADIOLOGY WITH ANESTHESIA MRI OF BRAIN WITH AND WITHOUT CONTRAST;  Surgeon: Radiologist, Medication, MD;  Location: [place];  Service: Radiology;  Laterality: N/A;  . RADIOLOGY WITH ANESTHESIA N/A 12/07/2017   Procedure: MRI WITH ANESTHESIA OF BRAIN WITH AND WITHOUT CONTRAST;  Surgeon: Radiologist, Medication, MD;  Location: [place];  Service: Radiology;  Laterality: N/A;  . right power port placement Right     There were no vitals filed for this visit.  Subjective Assessment - 01/24/18 1027  Subjective  I had to readjust the Reidsleeve after Butch Penny fixed it just because the hand ended up feleing too tight, especially at my fingers. But I wore it both nights over the weekend and my hand looks great this morning!     Pertinent History  Left mastectomy and sentinel node biopsy 08/21/15 and then axillary dissection following that which showed 14/15 positive nodes. Then chemotherapy and radiation after breast reconstruction. Brain tumor removed 08/14/16 which was metastatic disease followed by full brain radiation. Surgery and chemo were done in [place]. She reports bipolar illness and is a recovering alcoholic and drug addict and has been clean for 5  years. Currently she continues with Xgeva and Herceptin and reports her disease is stable.    Patient Stated Goals  reduce arm swelling    Currently in Pain?  No/denies            LYMPHEDEMA/ONCOLOGY QUESTIONNAIRE - 01/24/18 1031      Left Upper Extremity Lymphedema   15 cm Proximal to Olecranon Process  33 cm    10 cm Proximal to Olecranon Process  32.4 cm    Olecranon Process  27.3 cm    15 cm Proximal to Ulnar Styloid Process  25.7 cm    10 cm Proximal to Ulnar Styloid Process  22.3 cm    Just Proximal to Ulnar Styloid Process  16.7 cm    Across Hand at [employer]  17.4 cm    At [place] of 2nd Digit  5.5 cm               [facility] Adult PT Treatment/Exercise - 01/24/18 0001      Manual Therapy   Edema Management  circumference measurements taken    Manual Lymphatic Drainage (MLD)  In supine, short neck, superficial and deep abdomen, right axilla and anterior interaxillary anastomosis, left groin and axillo-inguinal anastomosis, and left UE from fingers to shoulder; then in right sidelying, posterior interaxillary anastomosis left to right and left axillo-inguinal anastomosis.               PT Short Term Goals - 01/17/18 2021      PT SHORT TERM GOAL #1   Title  Patient will be able to verbalize understanding of the treatment for lymphedema and ways to reduce her risk of exacerbation of swelling.    Status  Achieved      PT SHORT TERM GOAL #2   Title  Reduce left arm circumference at 10 cm proximal to her olecranon to </= 28 cm.    Status  Deferred      PT SHORT TERM GOAL #3   Title  Reduce left arm circumference at 15 cm proximal to her ulnar styloid process to </= 24 cm.    Status  Deferred        PT Long Term Goals - 01/17/18 2023      PT LONG TERM GOAL #1   Title  Reduce left arm circumference at 10 cm proximal to her olecranon to </= 25 cm.    Status  Deferred      PT LONG TERM GOAL #2   Title  Reduce left arm circumference at 15 cm proximal  to her ulnar styloid process to </= 23 cm.    Status  Deferred      PT LONG TERM GOAL #3   Title  Patient will be able to verbalize understanding of where and how to be fitted for a compression sleeve and  glove.    Status  Achieved      PT LONG TERM GOAL #4   Status  Revised            Plan - 01/24/18 1222    Clinical Impression Statement  Pt is pleased with reductions she has attained from the weekend. She had readjusted her Reidsleeve and today her circumference measurements in her hand had reduced 0.5 cm and her lower arm had reduced as well.     Rehab Potential  Excellent    Clinical Impairments Affecting Rehab Potential  None    PT Frequency  2x / week    PT Duration  4 weeks    PT Treatment/Interventions  ADLs/Self Care Home Management;Therapeutic exercise;Passive range of motion;Manual lymph drainage;Compression bandaging;Manual techniques;Therapeutic activities;DME Instruction    PT Next Visit Plan  Assess how patient's swelling is responding to use of day- and nighttime garments, and whether she received her pump. Continue manual lymph drainage and instructing patient toward goal of making sure she is independent in doing this properly.     Consulted and Agree with Plan of Care  Patient       Patient will benefit from skilled therapeutic intervention in order to improve the following deficits and impairments:  Decreased knowledge of use of DME, Pain, Postural dysfunction, Decreased scar mobility, Decreased range of motion, Impaired UE functional use, Increased edema  Visit Diagnosis: Postmastectomy lymphedema     Problem List Patient Active Problem List   Diagnosis Date Noted  . GERD (gastroesophageal reflux disease) 12/10/2017  . Edema 11/09/2017  . Headache 05/21/2017  . Lithium overdose 04/26/2017  . Acute encephalopathy 03/16/2017  . Acute lower UTI   . Hypokalemia 02/18/2017  . Gait abnormality 01/06/2017  . Bipolar I disorder, most recent episode  depressed ([place]) 12/08/2016  . Adjustment disorder with anxiety 12/06/2016  . Delirium due to another medical condition 12/03/2016  . Confusion 12/03/2016  . Diarrhea 12/03/2016  . Metastatic breast cancer ([place])   . Cerumen impaction 11/25/2016  . Otitis media 11/06/2016  . Brain metastasis ([place]) 07/27/2016  . Iron deficiency anemia 06/26/2016  . Bone metastases ([place]) 06/03/2016  . Primary cancer of lower-inner quadrant of left female breast ([place]) 06/01/2016  . Pap smear for cervical cancer screening 03/28/2015  . Current smoker 03/28/2015  . Healthcare maintenance 03/28/2015  . Seasonal allergies 03/28/2015  . Anxiety state 02/28/2015  . Fibromyalgia 02/28/2015  . Family history of diabetes mellitus 02/28/2015  . H/O alcohol abuse     Otelia Limes, [place] 01/24/2018, 12:29 PM  [facility] [place], [place], [address] Phone: [phone]   Fax:  [phone]  Name: [name] MRN: [id number] Date of Birth: May 26, 1961

## 2018-01-26 ENCOUNTER — Telehealth: Payer: Self-pay | Admitting: Oncology

## 2018-01-26 ENCOUNTER — Other Ambulatory Visit: Payer: Self-pay | Admitting: Oncology

## 2018-01-26 ENCOUNTER — Other Ambulatory Visit: Payer: Self-pay

## 2018-01-26 DIAGNOSIS — C50919 Malignant neoplasm of unspecified site of unspecified female breast: Secondary | ICD-10-CM

## 2018-01-26 NOTE — Telephone Encounter (Signed)
No entry 

## 2018-01-26 NOTE — Telephone Encounter (Signed)
Faxed records to Dr Iran Planas

## 2018-01-26 NOTE — Progress Notes (Signed)
Received referral per patient request and per Dr [name].  Contacted Dr. Irene Limbo office and spoke with "Santiago Glad".  She will contact the patient with an appointment.  I sent an inbasket to [employer] in med records to fax pt's most recent notes, path, and films per Santiago Glad to Dr. Arnoldo Hooker Thimmappa's office.  Contacted pt and let her know she would be receiving call from their office to set up appointment, pt very appreciative of the referral. No other needs per pt at this time.

## 2018-02-01 ENCOUNTER — Ambulatory Visit: Payer: Medicaid Other | Attending: Oncology

## 2018-02-01 ENCOUNTER — Telehealth: Payer: Self-pay

## 2018-02-01 DIAGNOSIS — M79602 Pain in left arm: Secondary | ICD-10-CM | POA: Insufficient documentation

## 2018-02-01 DIAGNOSIS — I972 Postmastectomy lymphedema syndrome: Secondary | ICD-10-CM | POA: Insufficient documentation

## 2018-02-01 DIAGNOSIS — R293 Abnormal posture: Secondary | ICD-10-CM | POA: Insufficient documentation

## 2018-02-01 DIAGNOSIS — M25612 Stiffness of left shoulder, not elsewhere classified: Secondary | ICD-10-CM | POA: Insufficient documentation

## 2018-02-01 NOTE — Telephone Encounter (Signed)
Left voice message informing pt she had missed todays scheduled visit.

## 2018-02-04 ENCOUNTER — Ambulatory Visit: Payer: Medicaid Other | Admitting: Physical Therapy

## 2018-02-04 ENCOUNTER — Encounter: Payer: Self-pay | Admitting: Physical Therapy

## 2018-02-04 DIAGNOSIS — M25612 Stiffness of left shoulder, not elsewhere classified: Secondary | ICD-10-CM | POA: Diagnosis present

## 2018-02-04 DIAGNOSIS — M79602 Pain in left arm: Secondary | ICD-10-CM

## 2018-02-04 DIAGNOSIS — R293 Abnormal posture: Secondary | ICD-10-CM | POA: Diagnosis present

## 2018-02-04 DIAGNOSIS — I972 Postmastectomy lymphedema syndrome: Secondary | ICD-10-CM | POA: Diagnosis present

## 2018-02-04 NOTE — Therapy (Addendum)
[place], [place], [address] Phone: [phone]   Fax:  [phone]  Physical Therapy Treatment  Patient Details  Name: [name] MRN: [id number] Date of Birth: 1961-11-09 Referring Provider: [name], PA-C   Encounter Date: 02/04/2018  PT End of Session - 02/04/18 0844    Visit Number  12    Number of Visits  89 17 authorized by Medicaid     Date for PT Re-Evaluation  02/17/18 last date for medicaid    Authorization Type  Medicaid    Authorization Time Period  to 02/17/18    PT Start Time  0800    PT Stop Time  0843    PT Time Calculation (min)  43 min       Past Medical History:  Diagnosis Date  . Alcohol abuse   . Anemia    during chemo  . Anxiety    At age 78  . Arthritis Dx 2010  . Bipolar disorder ([place])   . Cancer ([place])    breast mets to brain  . Chronic pain   . Complication of anesthesia   . Depression   . Family history of adverse reaction to anesthesia    parents had PONV  . Fibromyalgia Dx 2005  . GERD (gastroesophageal reflux disease)   . Headache    hx  migraines  . Lymphedema of left arm   . Opiate dependence ([place])   . PONV (postoperative nausea and vomiting)   . Port-A-Cath in place   . PTSD (post-traumatic stress disorder)     Past Surgical History:  Procedure Laterality Date  . APPLICATION OF CRANIAL NAVIGATION N/A 08/14/2016   Procedure: APPLICATION OF CRANIAL NAVIGATION;  Surgeon: Erline Levine, MD;  Location: [facility];  Service: Neurosurgery;  Laterality: N/A;  . BREAST RECONSTRUCTION Left    with silicone implant  . CRANIOTOMY N/A 08/14/2016   Procedure: CRANIOTOMY TUMOR EXCISION WITH Lucky Rathke;  Surgeon: Erline Levine, MD;  Location: [facility];  Service: Neurosurgery;  Laterality: N/A;  . FIBULA FRACTURE SURGERY Left   . MASTECTOMY Left   . RADIOLOGY WITH ANESTHESIA N/A 07/23/2016   Procedure: MRI OF BRAIN WITH AND WITHOUT;  Surgeon: Medication  Radiologist, MD;  Location: [facility];  Service: Radiology;  Laterality: N/A;  . RADIOLOGY WITH ANESTHESIA N/A 09/08/2016   Procedure: MRI OF BRAIN WITH AND WITHOUT CONTRAST;  Surgeon: Medication Radiologist, MD;  Location: [facility];  Service: Radiology;  Laterality: N/A;  . RADIOLOGY WITH ANESTHESIA N/A 12/10/2016   Procedure: MRI OF BRAIN WITH AND WITHOUT;  Surgeon: Medication Radiologist, MD;  Location: [facility];  Service: Radiology;  Laterality: N/A;  . RADIOLOGY WITH ANESTHESIA N/A 03/02/2017   Procedure: MRI of BRAIN W and W/OUT CONTRAST;  Surgeon: Medication Radiologist, MD;  Location: [facility];  Service: Radiology;  Laterality: N/A;  . RADIOLOGY WITH ANESTHESIA N/A 07/29/2017   Procedure: RADIOLOGY WITH ANESTHESIA MRI OF BRAIN WITH AND WITHOUT CONTRAST;  Surgeon: Radiologist, Medication, MD;  Location: [facility] [place];  Service: Radiology;  Laterality: N/A;  . RADIOLOGY WITH ANESTHESIA N/A 12/07/2017   Procedure: MRI WITH ANESTHESIA OF BRAIN WITH AND WITHOUT CONTRAST;  Surgeon: Radiologist, Medication, MD;  Location: [facility];  Service: Radiology;  Laterality: N/A;  . right power port placement Right     There were no vitals filed for this visit.  Subjective Assessment - 02/04/18 0808    Subjective  Pt states she is wearing the [place] Pro almost every  night and her compression sleeve every day and the compression pump once a day. She is pleased with her hand but feels her wrist is a little more swollen today.  She has visible pitting edema in her arm     Pertinent History  Left mastectomy and sentinel node biopsy 08/21/15 and then axillary dissection following that which showed 14/15 positive nodes. Then chemotherapy and radiation after breast reconstruction. Brain tumor removed 08/14/16 which was metastatic disease followed by full brain radiation. Surgery and chemo were done in [place]. 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 Currently she continues with  Xgeva and Herceptin and reports her disease is stable.    Patient Stated Goals  reduce arm swelling    Currently in Pain?  No/denies            LYMPHEDEMA/ONCOLOGY QUESTIONNAIRE - 02/04/18 0810      Left Upper Extremity Lymphedema   10 cm Proximal to Olecranon Process  33.5 cm    Olecranon Process  27 cm    15 cm Proximal to Ulnar Styloid Process  26.5 cm    10 cm Proximal to Ulnar Styloid Process  22.5 cm    Just Proximal to Ulnar Styloid Process  15.6 cm    Across Hand at [employer]  19 cm    At [place] of 2nd Digit  5.6 cm               [facility] Adult PT Treatment/Exercise - 02/04/18 0001      Manual Therapy   Edema Management  circumference measurements taken assisted with reapplication of sleeve and glove after treatement     Manual Lymphatic Drainage (MLD)  In supine, short neck, superficial and deep abdomen, right axilla and anterior interaxillary anastomosis, left groin and axillo-inguinal anastomosis, and left UE from fingers to shoulder; then in right sidelying, posterior interaxillary anastomosis left to right and left axillo-inguinal anastomosis.               PT Short Term Goals - 01/17/18 2021      PT SHORT TERM GOAL #1   Title  Patient will be able to verbalize understanding of the treatment for lymphedema and ways to reduce her risk of exacerbation of swelling.    Status  Achieved      PT SHORT TERM GOAL #2   Title  Reduce left arm circumference at 10 cm proximal to her olecranon to </= 28 cm.    Status  Deferred      PT SHORT TERM GOAL #3   Title  Reduce left arm circumference at 15 cm proximal to her ulnar styloid process to </= 24 cm.    Status  Deferred        PT Long Term Goals - 01/17/18 2023      PT LONG TERM GOAL #1   Title  Reduce left arm circumference at 10 cm proximal to her olecranon to </= 25 cm.    Status  Deferred      PT LONG TERM GOAL #2   Title  Reduce left arm circumference at 15 cm proximal to her ulnar styloid  process to </= 23 cm.    Status  Deferred      PT LONG TERM GOAL #3   Title  Patient will be able to verbalize understanding of where and how to be fitted for a compression sleeve and glove.  Status  Achieved      PT LONG TERM GOAL #4   Status  Revised            Plan - 02/04/18 0845    Clinical Impression Statement  Pt has maintained good reductions in hand but comes in with thick boggy lymphedema in forearm and above elbow.  She felt better after MLD and wants to keep coming for futher reduction .  She is using her day and night garments and pump at home but can only tolerate it for 33 minutes.  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  consider teaching strength ABC or refer to [employer]        Patient will benefit from skilled therapeutic intervention in order to improve the following deficits and impairments:  Decreased knowledge of use of DME, Pain, Postural dysfunction, Decreased scar mobility, Decreased range of motion, Impaired UE functional use, Increased edema  Visit Diagnosis: Postmastectomy lymphedema  Abnormal posture  Stiffness of left shoulder, not elsewhere classified  Pain in left arm     Problem List Patient Active Problem List   Diagnosis Date Noted  . GERD (gastroesophageal reflux disease) 12/10/2017  . Edema 11/09/2017  . Headache 05/21/2017  . Lithium overdose 04/26/2017  . Acute  encephalopathy 03/16/2017  . Acute lower UTI   . Hypokalemia 02/18/2017  . Gait abnormality 01/06/2017  . Bipolar I disorder, most recent episode depressed ([place]) 12/08/2016  . Adjustment disorder with anxiety 12/06/2016  . Delirium due to another medical condition 12/03/2016  . Confusion 12/03/2016  . Diarrhea 12/03/2016  . Metastatic breast cancer ([place])   . Cerumen impaction 11/25/2016  . Otitis media 11/06/2016  . Brain metastasis ([place]) 07/27/2016  . Iron deficiency anemia 06/26/2016  . Bone metastases ([place]) 06/03/2016  . Primary cancer of lower-inner quadrant of left female breast ([place]) 06/01/2016  . Pap smear for cervical cancer screening 03/28/2015  . Current smoker 03/28/2015  . Healthcare maintenance 03/28/2015  . Seasonal allergies 03/28/2015  . Anxiety state 02/28/2015  . Fibromyalgia 02/28/2015  . Family history of diabetes mellitus 02/28/2015  . H/O alcohol abuse    Donato Heinz. Owens Shark PT  Norwood Levo 02/04/2018, 8:49 AM  [place], [place], [address] Phone: [phone]   Fax:  [phone]  Name: [name] MRN: [id number] Date of Birth: 06-Jul-1961 PHYSICAL THERAPY DISCHARGE SUMMARY  Visits from Start of Care: 12 Current functional level related to goals / functional outcomes: unknown   Remaining deficits: unknown   Education / Equipment: Lymphedema manangement  Plan: Patient agrees to discharge.  Patient goals were partially met. Patient is being discharged due to not returning since the last visit.  ?????          Maudry Diego, PT 07/01/18 9:15 AM

## 2018-02-09 ENCOUNTER — Telehealth: Payer: Self-pay | Admitting: Family Medicine

## 2018-02-09 NOTE — Telephone Encounter (Signed)
4 page, fax received 02-09-18. Plastic and reconstructive surgery.

## 2018-02-10 NOTE — Progress Notes (Signed)
[name]  [phone] [phone]) [phone]     ID: [name] DOB: 1961-07-09  MR#: [id number]  LNL#:[id number]  Patient Care Team: Charlott Rakes, MD as PCP - General (Family Medicine) Kyung Rudd, MD as Consulting Physician (Radiation Oncology) Erline Levine, MD as Consulting Physician (Neurosurgery) Corena Pilgrim, MD as Consulting Physician (Psychiatry) Mickeal Skinner Acey Lav, MD as Consulting Physician (Psychiatry) Nehemiah Settle, MD as Referring Physician (Plastic Surgery)  CHIEF COMPLAINT: Metastatic triple positive breast cancer  CURRENT TREATMENT: Trastuzumab, denosumab, anastrozole   INTERVAL HISTORY: [name] returns today for follow-up and treatment of her triple positive breast cancer.  She continues on anastrozole, with good tolerance. She has occasional hot flashes. She also has vaginal dryness and low libido. She was planning to take the pelvic health class.   She also receives trastuzumab every 21 days, with a dose due today. She tolerates this well. Her most recent echocardiogram on 02/14/2018 shows an ejection fraction in the 60-65% range.   She also receives monthly denosumab/ Xgeva, with a dose due today. She tolerates this well.   She had routine unilateral right mammography with CAD and tomography on 12/11/2017 at [facility] showing: breast density category C. There was no evidence of malignancy.    REVIEW OF SYSTEMS: Nazyia reports that she is doing well. She had her last chemotherapy in March 2017. She has thinning and frizzing hair.   Her family is doing well, and her son is expecting a baby in late March 2019.  She is also going to [place] in June 2019 for her nephew's graduation. She denies unusual headaches, visual changes, nausea, vomiting, or dizziness. There has been no unusual cough, phlegm production, or pleurisy. This been no change in bowel or bladder habits. She denies unexplained fatigue or unexplained weight loss,  bleeding, rash, or fever. A detailed review of systems was otherwise stable.     BREAST CANCER HISTORY: From the original intake note:  [name] was aware of a "lemon sized lump in" her left axilla for about a year before bringing it to medical attention. By then she had developed left breast and left axillary swelling (June 2016). She presented to the local emergency room and had a chest CT scan 06/06/2015 which showed a nodule in the left breast measuring 0.9 cm and questionable left axillary adenopathy. She then proceeded to bilateral diagnostic mammography and left breast ultrasonography 06/19/2015. There were no prior films for comparison (last mammography 12 years prior).. The breast density was category C. Mammography showed in the left breast upper inner quadrant a 7 cm area including a small mass and significant pleomorphic calcifications. Ultrasonography defined the mass as measuring 1.2 cm. The left axilla appeared unremarkable. There was significant skin edema.  Biopsy of the left breast mass 06/19/2015 showed ([id number]) an invasive ductal carcinoma, grade 2, estrogen receptor 83% positive, progesterone receptor 26% positive, and HER-2 amplified by immunohistochemstry with a 3+ reading. The patient had biopsies of a separate area in the left breast August of the same year and this showed atypical ductal hyperplasia. (SP [id number]).  Accordingly after appropriate discussion on 08/21/2015 the patient proceeded to left mastectomy with left axillary sentinel lymph node sampling, which, since the lymph nodes were positive, extended to the procedure to left axillary lymph node dissection. The pathology (SP [id number]) showed an invasive ductal carcinoma, grade 3, measuring in excess of 9 cm. There were also skin satellites, not contiguous with the invasive carcinoma. Margins were clear and ample. There  was evidence of lymphovascular invasion. A total of 15 lymph nodes were removed, including 5 sentinel  lymph nodes, all of which were positive, so that the final total was 14 out of 15 lymph nodes involved by tumor. There was evidence of extranodal extension. The final pathology was pT4b pN2a, stage IIIB  CA-27-29 and CEA 09/19/2015 were non-informative October 2016.  Unfortunately CT scans of the chest abdomen and pelvis 09/16/2015 showed bony metastases to the right scapula, left iliac crest, and also L4 and T-spine. There were questionable liver cysts which on repeat CT scan 03/02/2016 appear to be a little bit more well-defined, possibly a little larger. There were also some possible right upper lobe lung lesions.  Adjuvant treatment consisted of docetaxel, trastuzumab and pertuzumab, with the final (6th) docetaxel dose given 02/11/2016. She continues on trastuzumab and pertuzumab, with the 11th cycle given 05/05/2016. Echocardiogram 02/26/2016 showed an ejection fraction of 55%. She receives denosumab/Xgeva every 4 weeks.. She also receives radiation, started 06/09/201, to be completed 06/26/2016.  Her subsequent history is as detailed below   PAST MEDICAL HISTORY: Past Medical History:  Diagnosis Date  . Alcohol abuse   . Anemia    during chemo  . Anxiety    At age 37  . Arthritis Dx 2010  . Bipolar disorder ([facility])   . Cancer (HCC)    breast  . Chronic pain   . Depression   . Fibromyalgia Dx 2005  . GERD (gastroesophageal reflux disease)   . Headache    hx  migraines  . Opiate dependence ([facility])   . Port-a-cath in place   . PTSD (post-traumatic stress disorder)     PAST SURGICAL HISTORY: Past Surgical History:  Procedure Laterality Date  . APPLICATION OF CRANIAL NAVIGATION N/A 08/14/2016   Procedure: APPLICATION OF CRANIAL NAVIGATION;  Surgeon: Erline Levine, MD;  Location: [facility];  Service: Neurosurgery;  Laterality: N/A;  . BREAST RECONSTRUCTION Left    with silicone implant  . CRANIOTOMY N/A 08/14/2016   Procedure: CRANIOTOMY TUMOR EXCISION WITH Lucky Rathke;  Surgeon:  Erline Levine, MD;  Location: [facility];  Service: Neurosurgery;  Laterality: N/A;  . FIBULA FRACTURE SURGERY    . MASTECTOMY Left   . RADIOLOGY WITH ANESTHESIA N/A 07/23/2016   Procedure: MRI OF BRAIN WITH AND WITHOUT;  Surgeon: Medication Radiologist, MD;  Location: [facility];  Service: Radiology;  Laterality: N/A;  . right power port placement Right     FAMILY HISTORY Family History  Problem Relation Age of Onset  . Diabetes Mother   . Bipolar disorder Mother   . CAD Father   The patient's father still living, age 49, in [place]. He had prostate cancer at some point in the past. The patient's mother died at age 54 from complications of diabetes. The patient had no brothers, 2 sisters. A paternal grandmother had lung cancer in the setting of tobacco abuse. There is no other history of cancer in the family to her knowledge  GYNECOLOGIC HISTORY:  No LMP recorded. Patient has had an ablation. Menarche approximately age 28. First live birth in 77. The patient is GX P2. She underwent endometrial ablation in 2016.  SOCIAL HISTORY:  The patient is originally from [place]. She has lived in [place] before but more recently was in [place]. She is back here because she could not afford her rent in [place]. She is living here and a temporary situation. She is divorced. HER-2 children are [name] who lives in [place] and works as  a Development worker, community, and [name] who also lives in [place] and works as a Catering manager. The patient has a grandchild, Arelia Longest, 9 years old as of July 2017, living in [place] with his mother. The patient has not established herself with a local church yet.    ADVANCED DIRECTIVES: Not in place; at the 06/03/2016 visit the patient was given the appropriate forms to complete and notarize at her discretion   HEALTH MAINTENANCE: Social History  Substance Use Topics  . Smoking status: Current Every Day Smoker    Packs/day: 0.50    Types: Cigarettes  .  Smokeless tobacco: Never Used     Comment: Pt is on Chantix at present time  . Alcohol use Yes     Comment: no ETOH since 08/22/12     Colonoscopy:  PAP:  Bone density:   Allergies  Allergen Reactions  . Demerol Itching and Nausea And Vomiting  . Erythromycin Rash    Can take zpak    Outpatient Encounter Medications as of 02/15/2018  Medication Sig  . anastrozole (ARIMIDEX) 1 MG tablet Take 1 tablet (1 mg total) daily by mouth.  . Biotin 1000 MCG tablet Take 1,000 mcg by mouth daily.  . Cholecalciferol (VITAMIN D3) 5000 units CAPS Take 5,000 Units by mouth daily.  . cyclobenzaprine (FLEXERIL) 10 MG tablet Take 1 tablet (10 mg total) by mouth 2 (two) times daily as needed for muscle spasms.  . cycloSPORINE (RESTASIS) 0.05 % ophthalmic emulsion Place 1 drop into both eyes 2 (two) times daily.  [name] docusate sodium (COLACE) 100 MG capsule Take 100 mg by mouth daily as needed for mild constipation.   . fluticasone (FLONASE) 50 MCG/ACT nasal spray Place 2 sprays into both nostrils daily.  [name] gabapentin (NEURONTIN) 300 MG capsule Take 1 capsule (300 mg total) by mouth 2 (two) times daily.  [name] ibuprofen (ADVIL,MOTRIN) 800 MG tablet TAKE 1 TABLET BY MOUTH THREE TIMES A DAY  . lamoTRIgine (LAMICTAL) 25 MG tablet Take 1 tablet (25 mg total) by mouth 2 (two) times daily. (Patient taking differently: Take 75 mg by mouth every morning. Pt takes 3 tabs q am)  . lurasidone (LATUDA) 40 MG TABS tablet Take 1 tablet (40 mg total) by mouth daily with breakfast.  . ondansetron (ZOFRAN) 8 MG tablet Take 1 tablet (8 mg total) by mouth every 8 (eight) hours as needed for nausea or vomiting.  . pantoprazole (PROTONIX) 40 MG tablet Take 1 tablet (40 mg total) by mouth daily.  . polyethylene glycol (MIRALAX / GLYCOLAX) packet TAKE 17 G BY MOUTH DAILY AS NEEDED FOR MILD CONSTIPATION.  . traZODone (DESYREL) 100 MG tablet Take 1 tablet (100 mg total) by mouth at bedtime. (Patient taking differently: Take 200 mg by  mouth at bedtime. )  . [DISCONTINUED] Aspirin-Acetaminophen-Caffeine (GOODY HEADACHE PO) Take 1 packet by mouth See admin instructions. Up to 6 or 7 times a day as needed for pain  . [DISCONTINUED] loratadine-pseudoephedrine (CLARITIN-D 12 HOUR) 5-120 MG tablet Take 1 tablet by mouth 2 (two) times daily as needed for allergies. (Patient not taking: Reported on 02/15/2018)  . [DISCONTINUED] ondansetron (ZOFRAN) 8 MG tablet Take 1 tablet (8 mg total) by mouth every 8 (eight) hours as needed for nausea or vomiting.  . [DISCONTINUED] sodium chloride flush (NS) 0.9 % injection 10 mL    No facility-administered encounter medications on file as of 02/15/2018.     OBJECTIVE: Middle-aged white woman who appears well  Vitals:   02/15/18 1000  BP:  126/73  Pulse: 91  Resp: 18  Temp: 98.1 F (36.7 C)  TempSrc: Oral  SpO2: 98%  Weight: 155 lb 12.8 oz (70.7 kg)  Height: 5' 5"  (1.651 m)  Body mass index is 25.93 kg/m.  ECOG: 0  Some hair thinning on the crown and some dryness of the hair, possibly secondary to earlier radiation Sclerae unicteric, pupils round and equal Oropharynx clear and moist No cervical or supraclavicular adenopathy Lungs no rales or rhonchi Heart regular rate and rhythm Abd soft, nontender, positive bowel sounds MSK no focal spinal tenderness, no upper extremity lymphedema Neuro: nonfocal, well oriented, appropriate affect Breasts: The right breast is benign.  The left breast is status post mastectomy with expander in place.  There is no evidence of local recurrence.  Both axillae are benign.   LAB RESULTS: Results for JEWELIANNA, PANCOAST (MRN [id number]) as of 02/15/2018 10:34  Ref. Range 08/31/2017 09:00 09/28/2017 08:53 10/26/2017 10:38 12/20/2017 10:48 01/18/2018 08:05  CA 27.29 Latest Ref Range: 0.0 - 38.6 U/mL 3.5 10.1 15.5 13.5 8.7    CBC    Component Value Date/Time   WBC 3.5 (L) 02/15/2018 0914   RBC 3.99 02/15/2018 0914   HGB 13.0 02/15/2018 0914   HGB 12.1  10/26/2017 1038   HCT 38.6 02/15/2018 0914   HCT 35.7 10/26/2017 1038   PLT 171 02/15/2018 0914   PLT 167 10/26/2017 1038   MCV 96.7 02/15/2018 0914   MCV 98.4 10/26/2017 1038   MCH 32.6 02/15/2018 0914   MCHC 33.7 02/15/2018 0914   RDW 12.1 02/15/2018 0914   RDW 13.3 10/26/2017 1038   LYMPHSABS 1.1 02/15/2018 0914   LYMPHSABS 0.5 (L) 10/26/2017 1038   MONOABS 0.4 02/15/2018 0914   MONOABS 0.1 10/26/2017 1038   EOSABS 0.1 02/15/2018 0914   EOSABS 0.0 10/26/2017 1038   BASOSABS 0.0 02/15/2018 0914   BASOSABS 0.0 10/26/2017 1038   CMP Latest Ref Rng & Units 02/15/2018 01/18/2018 12/20/2017  Glucose 70 - 140 mg/dL 90 92 97  BUN 7 - 26 mg/dL 12 12 10   Creatinine 0.60 - 1.10 mg/dL 0.94 0.90 1.04  Sodium 136 - 145 mmol/L 139 140 142  Potassium 3.5 - 5.1 mmol/L 4.3 4.1 3.9  Chloride 98 - 109 mmol/L 104 105 105  CO2 22 - 29 mmol/L 28 28 28   Calcium 8.4 - 10.4 mg/dL 9.4 9.2 9.1  Total Protein 6.4 - 8.3 g/dL 6.5 6.1(L) 6.4  Total Bilirubin 0.2 - 1.2 mg/dL 0.2 <0.2(L) <0.2(L)  Alkaline Phos 40 - 150 U/L 50 48 45  AST 5 - 34 U/L 25 21 26   ALT 0 - 55 U/L 23 20 27      STUDIES: She had routine unilateral right mammography with CAD and tomography on 12/11/2017 at [facility] showing: breast density category C. There was no evidence of malignancy.   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 No recurrent tumor. No new metastatic disease. Significant progression and diffuse white matter changes compatible with treatment effect   ELIGIBLE FOR AVAILABLE RESEARCH PROTOCOL: no  ASSESSMENT: 57 y.o. [place] woman with stage IV left-sided breast cancer involving bone and central nervous system  (1) s/p left breast lower inner quadrant biopsy 06/19/2015 for a clinical T2-3 NX invasive ductal carcinoma, grade 2, triple positive.  (2) status post left mastectomy and axillary lymph node dissection  with immediate expander  placement 07/18/2015 for an mpT4 pN2,stage IIIB invasive ductal carcinoma, grade 3,  with negative margins.  (a) definitive implant exchange to be scheduled in December   METASTATIC DISEASE: October 2016  (3) CT scan of the chest abdomen and pelvis  09/16/2015 shows metastatic lesions in the right scapula, left iliac crest, L4, and T spine. There were questionable liver cysts, with repeat CT scan 03/02/2016 showing possible right upper lobe lung lesions and possibly increased liver lesions  (a) CT scan of the chest 06/17/2016 shows no active disease in the lungs or liver  (b) Bone scan July 2017 showed no evidence of bony metastatic disease   (c) head CT 07/08/2016 showed a cerebellar lesion, confirmed by MRI 07/23/2016, status post craniotomy 08/14/2016, confirming a metastatic deposit which was estrogen and progesterone receptor negative, HER-2 amplified with a signals ratio of 7.16, number per cell 13.25  (d) CA 27-29 is not informative  (4) received docetaxel every 3 weeks 6 together with trastuzumab and pertuzumab, last docetaxel dose 02/11/2016  (5) adjuvant radiation7/03/2016 to 06/26/2016 at [facility]: 1. The Left chest wall was treated to 23.4 Gy in 13 fractions at 1.8 Gy per fraction. 2. The Left chest wall was boosted to 10 Gy in 5 fractions at 2 Gy per fraction. 3. The Left Sclav/PAB was treated to 23.4 Gy in 13 fractions at 1.8 Gy per fraction.  [Note: Including the patient's treatment in [place] (received 15 fractions per Dr. Maryan Rued near [place], [place]), the patient received 50.4 Gy to the left chest wall and supraclavicular region. ]  (6) started trastuzumab and pertuzumab October 2016, continuing every 3 weeks,  (a) echocardiogram 02/26/2016 showed a well preserved ejection fraction  (b) echocardiogram 07/01/2016 shows an ejection fraction in the 60-65%   (c) pertuzumab discontinued 10/2016 with uncontrolled diarrhea  (d) echocardiogram 11/11/2016 showed an ejection  fraction in the 60-65%  (e) echocardiogram 03/03/2017 shows an ejection fraction of 60-65%  (f) echocardiogram on 05/19/2017 shows an ejection fraction of 55-60%  (g) echocardiogram 09/24/2017 shows the ejection fraction in the 60-65% range.  (h) echocardiogram 02/14/2018 shows an ejection fraction in the 60-65% range.    (7) started denosumab/Xgeva October 2017, repeated every 4 weeks  (8) started anastrozole October 2017   (a) bone scan 11/10/2016 shows no active disease  (b) chest CT scan 11/10/2016 stable, with no evidence of active disease  (c) chest CT and bone scan 07/02/2017 show no evidence of active disease  (d) CT scan of the chest with contrast 11/10/2017 shows some left axillary edema, but no evidence of thrombosis or adenopathy in that area, 0.9 cm precarinal lymph and 0.7 cm right upper lobe nodule node; bone lesions stable   (9) history of bipolar disorder  (a) currently on Lamictal and Latuda as well as Desiree L and Neurontin  (10) mild anemia with a significant drop in the MCV, ferritin 10 06/03/2016,   (a) Feraheme given 06/12/2016 and 06/18/2016  (11) tobacco abuse: Chantix started 06/18/2016--she is not currently trying to quit   (12) 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  (a) brain irradiation10/19/17-11/08/17: Whole brain/ 35 Gy in 14 fractions   (b) repeat brain MRI obtained 12/10/2016 shows no active disease in the brain  (c) repeat brain MRI 03/02/2017 shows no evidence of residual or recurrent disease  (d) repeat brain MRI 07/29/2017 shows no evidence of residual or recurrent disease  (e) repeat brain MRI 12/07/2017 shows no evidence of disease recurrence.  There is progressive white matter change secondary to prior treatment.   PLAN: Warren Lacy  is now 2-1/2 years out from definitive diagnosis of metastatic breast cancer, involving the brain.  There is no evidence of active disease.  This is very favorable.  She is  tolerating the trastuzumab without any side effects that she is aware of and she maintains an excellent echocardiogram.  She tolerates the anastrozole also without any thickened complications.  I have urged her to participate in the pelvic health program.  She will give that some thought.  From an abuse point of view she is very motivated and has been clean now since I have known her here in [place]  The plan at this point is to continue the current treatments indefinitely.  She has been on Xgeva since July 2017.  When I see her again in June we will consider moving that to every 3 months  She has a trip to [place] with family mid June.  This is very favorable.  She is scheduled for repeat brain MRI in May.  I will obtain a CT of the chest at the same time.  She knows to call for any problems that may develop before her next visit.  Rodnisha Blomgren, Virgie Dad, MD  02/15/18 10:30 AM Medical Oncology and Hematology [facility] [address] [place], [place] [address] Tel. [phone]    Fax. [phone]  This document serves as a record of services personally performed by Lurline Del, MD. It was created on his behalf by Sheron Nightingale, a trained medical scribe. The creation of this record is based on the scribe's personal observations and the provider's statements to them.   I have reviewed the above documentation for accuracy and completeness, and I agree with the above.

## 2018-02-11 ENCOUNTER — Other Ambulatory Visit: Payer: Self-pay | Admitting: *Deleted

## 2018-02-11 ENCOUNTER — Other Ambulatory Visit: Payer: Self-pay | Admitting: Urology

## 2018-02-11 MED ORDER — ONDANSETRON HCL 8 MG PO TABS: 8.0000 mg | ORAL_TABLET | Freq: Three times a day (TID) | ORAL | 1 refills | Status: DC | PRN

## 2018-02-14 ENCOUNTER — Ambulatory Visit (HOSPITAL_COMMUNITY): Payer: Medicaid Other | Attending: Cardiovascular Disease

## 2018-02-14 ENCOUNTER — Other Ambulatory Visit: Payer: Self-pay

## 2018-02-14 DIAGNOSIS — R51 Headache: Secondary | ICD-10-CM | POA: Diagnosis not present

## 2018-02-14 DIAGNOSIS — I351 Nonrheumatic aortic (valve) insufficiency: Secondary | ICD-10-CM | POA: Insufficient documentation

## 2018-02-14 DIAGNOSIS — G8929 Other chronic pain: Secondary | ICD-10-CM

## 2018-02-14 DIAGNOSIS — C50312 Malignant neoplasm of lower-inner quadrant of left female breast: Secondary | ICD-10-CM

## 2018-02-14 DIAGNOSIS — C7931 Secondary malignant neoplasm of brain: Secondary | ICD-10-CM | POA: Diagnosis not present

## 2018-02-14 DIAGNOSIS — C50919 Malignant neoplasm of unspecified site of unspecified female breast: Secondary | ICD-10-CM | POA: Diagnosis present

## 2018-02-14 DIAGNOSIS — R519 Headache, unspecified: Secondary | ICD-10-CM

## 2018-02-14 DIAGNOSIS — C7951 Secondary malignant neoplasm of bone: Secondary | ICD-10-CM | POA: Diagnosis not present

## 2018-02-14 DIAGNOSIS — F3130 Bipolar disorder, current episode depressed, mild or moderate severity, unspecified: Secondary | ICD-10-CM | POA: Diagnosis not present

## 2018-02-15 ENCOUNTER — Inpatient Hospital Stay: Payer: Medicaid Other

## 2018-02-15 ENCOUNTER — Inpatient Hospital Stay (HOSPITAL_BASED_OUTPATIENT_CLINIC_OR_DEPARTMENT_OTHER): Payer: Medicaid Other | Admitting: Oncology

## 2018-02-15 ENCOUNTER — Inpatient Hospital Stay: Payer: Medicaid Other | Attending: Medical

## 2018-02-15 VITALS — BP 126/73 | HR 91 | Temp 98.1°F | Resp 18 | Ht 65.0 in | Wt 155.8 lb

## 2018-02-15 DIAGNOSIS — Z5112 Encounter for antineoplastic immunotherapy: Secondary | ICD-10-CM | POA: Insufficient documentation

## 2018-02-15 DIAGNOSIS — F17218 Nicotine dependence, cigarettes, with other nicotine-induced disorders: Secondary | ICD-10-CM | POA: Insufficient documentation

## 2018-02-15 DIAGNOSIS — C7951 Secondary malignant neoplasm of bone: Secondary | ICD-10-CM

## 2018-02-15 DIAGNOSIS — C7931 Secondary malignant neoplasm of brain: Secondary | ICD-10-CM

## 2018-02-15 DIAGNOSIS — Z79811 Long term (current) use of aromatase inhibitors: Secondary | ICD-10-CM

## 2018-02-15 DIAGNOSIS — G9340 Encephalopathy, unspecified: Secondary | ICD-10-CM

## 2018-02-15 DIAGNOSIS — Z9012 Acquired absence of left breast and nipple: Secondary | ICD-10-CM | POA: Diagnosis not present

## 2018-02-15 DIAGNOSIS — Z923 Personal history of irradiation: Secondary | ICD-10-CM | POA: Insufficient documentation

## 2018-02-15 DIAGNOSIS — F319 Bipolar disorder, unspecified: Secondary | ICD-10-CM | POA: Insufficient documentation

## 2018-02-15 DIAGNOSIS — Z170 Estrogen receptor positive status [ER+]: Secondary | ICD-10-CM | POA: Diagnosis not present

## 2018-02-15 DIAGNOSIS — F17210 Nicotine dependence, cigarettes, uncomplicated: Secondary | ICD-10-CM | POA: Diagnosis not present

## 2018-02-15 DIAGNOSIS — C50312 Malignant neoplasm of lower-inner quadrant of left female breast: Secondary | ICD-10-CM | POA: Insufficient documentation

## 2018-02-15 DIAGNOSIS — Z171 Estrogen receptor negative status [ER-]: Secondary | ICD-10-CM | POA: Insufficient documentation

## 2018-02-15 DIAGNOSIS — C50919 Malignant neoplasm of unspecified site of unspecified female breast: Secondary | ICD-10-CM

## 2018-02-15 DIAGNOSIS — Z95828 Presence of other vascular implants and grafts: Secondary | ICD-10-CM

## 2018-02-15 LAB — COMPREHENSIVE METABOLIC PANEL
ALT: 23 U/L (ref 0–55)
AST: 25 U/L (ref 5–34)
Albumin: 3.7 g/dL (ref 3.5–5.0)
Alkaline Phosphatase: 50 U/L (ref 40–150)
Anion gap: 7 (ref 3–11)
BUN: 12 mg/dL (ref 7–26)
CO2: 28 mmol/L (ref 22–29)
Calcium: 9.4 mg/dL (ref 8.4–10.4)
Chloride: 104 mmol/L (ref 98–109)
Creatinine, Ser: 0.94 mg/dL (ref 0.60–1.10)
GFR calc Af Amer: >60 mL/min (ref >60)
GFR calc non Af Amer: >60 mL/min (ref >60)
Glucose, Bld: 90 mg/dL (ref 70–140)
Potassium: 4.3 mmol/L (ref 3.5–5.1)
Sodium: 139 mmol/L (ref 136–145)
Total Bilirubin: 0.2 mg/dL (ref 0.2–1.2)
Total Protein: 6.5 g/dL (ref 6.4–8.3)

## 2018-02-15 LAB — CBC WITH DIFFERENTIAL/PLATELET
Basophils Absolute: 0 10*3/uL (ref 0.0–0.1)
Basophils Relative: 1 %
Eosinophils Absolute: 0.1 10*3/uL (ref 0.0–0.5)
Eosinophils Relative: 2 %
HCT: 38.6 % (ref 34.8–46.6)
Hemoglobin: 13 g/dL (ref 11.6–15.9)
Lymphocytes Relative: 30 %
Lymphs Abs: 1.1 10*3/uL (ref 0.9–3.3)
MCH: 32.6 pg (ref 25.1–34.0)
MCHC: 33.7 g/dL (ref 31.5–36.0)
MCV: 96.7 fL (ref 79.5–101.0)
Monocytes Absolute: 0.4 10*3/uL (ref 0.1–0.9)
Monocytes Relative: 11 %
Neutro Abs: 2 10*3/uL (ref 1.5–6.5)
Neutrophils Relative %: 56 %
Platelets: 171 10*3/uL (ref 145–400)
RBC: 3.99 MIL/uL (ref 3.70–5.45)
RDW: 12.1 % (ref 11.2–14.5)
WBC: 3.5 10*3/uL — ABNORMAL LOW (ref 3.9–10.3)

## 2018-02-15 MED ORDER — HEPARIN SOD (PORK) LOCK FLUSH 100 UNIT/ML IV SOLN
500.0000 [IU] | Freq: Once | INTRAVENOUS | Status: AC | PRN
  Administered 2018-02-15: 500 [IU]
  Filled 2018-02-15: qty 5

## 2018-02-15 MED ORDER — SODIUM CHLORIDE 0.9% FLUSH
10.0000 mL | INTRAVENOUS | Status: DC | PRN
  Administered 2018-02-15: 10 mL
  Filled 2018-02-15: qty 10

## 2018-02-15 MED ORDER — DENOSUMAB 120 MG/1.7ML ~~LOC~~ SOLN
120.0000 mg | Freq: Once | SUBCUTANEOUS | Status: AC
  Administered 2018-02-15: 120 mg via SUBCUTANEOUS

## 2018-02-15 MED ORDER — DIPHENHYDRAMINE HCL 25 MG PO CAPS: 25.0000 mg | ORAL_CAPSULE | Freq: Once | ORAL | Status: DC

## 2018-02-15 MED ORDER — SODIUM CHLORIDE 0.9 % IV SOLN
Freq: Once | INTRAVENOUS | Status: AC
  Administered 2018-02-15: 11:00:00 via INTRAVENOUS

## 2018-02-15 MED ORDER — DENOSUMAB 120 MG/1.7ML ~~LOC~~ SOLN
SUBCUTANEOUS | Status: AC
  Filled 2018-02-15: qty 1.7

## 2018-02-15 MED ORDER — ACETAMINOPHEN 325 MG PO TABS
ORAL_TABLET | ORAL | Status: AC
  Filled 2018-02-15: qty 2

## 2018-02-15 MED ORDER — ACETAMINOPHEN 325 MG PO TABS
650.0000 mg | ORAL_TABLET | Freq: Once | ORAL | Status: AC
  Administered 2018-02-15: 650 mg via ORAL

## 2018-02-15 MED ORDER — TRASTUZUMAB CHEMO 150 MG IV SOLR
450.0000 mg | Freq: Once | INTRAVENOUS | Status: AC
  Administered 2018-02-15: 450 mg via INTRAVENOUS
  Filled 2018-02-15: qty 21.43

## 2018-02-15 MED ORDER — SODIUM CHLORIDE 0.9% FLUSH
10.0000 mL | INTRAVENOUS | Status: DC | PRN
  Administered 2018-02-15: 10 mL via INTRAVENOUS
  Filled 2018-02-15: qty 10

## 2018-02-15 NOTE — Patient Instructions (Signed)
[facility] Discharge Instructions for Patients Receiving Chemotherapy  Today you received the following chemotherapy agents Herceptin  To help prevent nausea and vomiting after your treatment, we encourage you to take your nausea medication as directed   If you develop nausea and vomiting that is not controlled by your nausea medication, call the clinic.   BELOW ARE SYMPTOMS THAT SHOULD BE REPORTED IMMEDIATELY:  *FEVER GREATER THAN 100.5 F  *CHILLS WITH OR WITHOUT FEVER  NAUSEA AND VOMITING THAT IS NOT CONTROLLED WITH YOUR NAUSEA MEDICATION  *UNUSUAL SHORTNESS OF BREATH  *UNUSUAL BRUISING OR BLEEDING  TENDERNESS IN MOUTH AND THROAT WITH OR WITHOUT PRESENCE OF ULCERS  *URINARY PROBLEMS  *BOWEL PROBLEMS  UNUSUAL RASH Items with * indicate a potential emergency and should be followed up as soon as possible.  Feel free to call the clinic should you have any questions or concerns. The clinic phone number is [phone].  Please show the CHEMO ALERT CARD at check-in to the Emergency Department and triage nurse.   

## 2018-02-16 LAB — CANCER ANTIGEN 27.29: CA 27.29: 13.4 U/mL (ref 0.0–38.6)

## 2018-02-28 ENCOUNTER — Other Ambulatory Visit: Payer: Self-pay | Admitting: Oncology

## 2018-03-11 ENCOUNTER — Encounter: Payer: Self-pay | Admitting: Family Medicine

## 2018-03-11 ENCOUNTER — Ambulatory Visit: Payer: Medicaid Other | Attending: Family Medicine | Admitting: Family Medicine

## 2018-03-11 VITALS — BP 116/80 | HR 90 | Temp 98.0°F | Ht 65.0 in | Wt 157.0 lb

## 2018-03-11 DIAGNOSIS — K219 Gastro-esophageal reflux disease without esophagitis: Secondary | ICD-10-CM | POA: Diagnosis not present

## 2018-03-11 DIAGNOSIS — Z171 Estrogen receptor negative status [ER-]: Secondary | ICD-10-CM | POA: Diagnosis not present

## 2018-03-11 DIAGNOSIS — G8929 Other chronic pain: Secondary | ICD-10-CM | POA: Insufficient documentation

## 2018-03-11 DIAGNOSIS — Z923 Personal history of irradiation: Secondary | ICD-10-CM | POA: Diagnosis not present

## 2018-03-11 DIAGNOSIS — C50312 Malignant neoplasm of lower-inner quadrant of left female breast: Secondary | ICD-10-CM | POA: Diagnosis not present

## 2018-03-11 DIAGNOSIS — Z79899 Other long term (current) drug therapy: Secondary | ICD-10-CM | POA: Diagnosis not present

## 2018-03-11 DIAGNOSIS — Z881 Allergy status to other antibiotic agents status: Secondary | ICD-10-CM | POA: Insufficient documentation

## 2018-03-11 DIAGNOSIS — Z1211 Encounter for screening for malignant neoplasm of colon: Secondary | ICD-10-CM | POA: Diagnosis not present

## 2018-03-11 DIAGNOSIS — R51 Headache: Secondary | ICD-10-CM | POA: Diagnosis not present

## 2018-03-11 DIAGNOSIS — Z959 Presence of cardiac and vascular implant and graft, unspecified: Secondary | ICD-10-CM | POA: Diagnosis not present

## 2018-03-11 DIAGNOSIS — R519 Headache, unspecified: Secondary | ICD-10-CM

## 2018-03-11 DIAGNOSIS — F4310 Post-traumatic stress disorder, unspecified: Secondary | ICD-10-CM | POA: Insufficient documentation

## 2018-03-11 DIAGNOSIS — I890 Lymphedema, not elsewhere classified: Secondary | ICD-10-CM | POA: Diagnosis not present

## 2018-03-11 DIAGNOSIS — F1120 Opioid dependence, uncomplicated: Secondary | ICD-10-CM | POA: Insufficient documentation

## 2018-03-11 DIAGNOSIS — Z85841 Personal history of malignant neoplasm of brain: Secondary | ICD-10-CM | POA: Diagnosis not present

## 2018-03-11 DIAGNOSIS — Z9889 Other specified postprocedural states: Secondary | ICD-10-CM | POA: Insufficient documentation

## 2018-03-11 DIAGNOSIS — F3130 Bipolar disorder, current episode depressed, mild or moderate severity, unspecified: Secondary | ICD-10-CM | POA: Diagnosis not present

## 2018-03-11 DIAGNOSIS — J302 Other seasonal allergic rhinitis: Secondary | ICD-10-CM

## 2018-03-11 DIAGNOSIS — Z9012 Acquired absence of left breast and nipple: Secondary | ICD-10-CM | POA: Insufficient documentation

## 2018-03-11 DIAGNOSIS — Z87891 Personal history of nicotine dependence: Secondary | ICD-10-CM | POA: Insufficient documentation

## 2018-03-11 DIAGNOSIS — Z1159 Encounter for screening for other viral diseases: Secondary | ICD-10-CM | POA: Diagnosis not present

## 2018-03-11 DIAGNOSIS — M62838 Other muscle spasm: Secondary | ICD-10-CM

## 2018-03-11 DIAGNOSIS — Z885 Allergy status to narcotic agent status: Secondary | ICD-10-CM | POA: Diagnosis not present

## 2018-03-11 MED ORDER — PANTOPRAZOLE SODIUM 40 MG PO TBEC: 40.0000 mg | DELAYED_RELEASE_TABLET | Freq: Every day | ORAL | 3 refills | Status: DC

## 2018-03-11 MED ORDER — CYCLOBENZAPRINE HCL 10 MG PO TABS: 10.0000 mg | ORAL_TABLET | Freq: Two times a day (BID) | ORAL | 3 refills | Status: DC | PRN

## 2018-03-11 NOTE — Patient Instructions (Signed)

## 2018-03-11 NOTE — Progress Notes (Signed)
Subjective:  Patient ID: [name], female    DOB: 06-Mar-1961  Age: 57 y.o. 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  She has had left upper extremity swelling without pain and is wearing a left arm sleeve with improvement in her edema.  She is also undergoing physical therapy which she tolerates well. Her last visit to oncology was on 01/2018 and as per notes no evidence of active disease.   Current energy level is good and she denies shortness of breath. 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  She currently uses Flonase and Zyrtec. She will be traveling to see her family in [place] in June and is excited about this. Her headaches and upper muscle spasms and pain are controlled on Flexeril. With regards to her bipolar disorder her moods have been stable and she likes her psychiatrist-Dr. Darleene Cleaver tolerating her current regimen well.  Past Medical History:  Diagnosis Date  . Alcohol abuse   . Anemia    during chemo  . Anxiety    At age 7  . Arthritis Dx 2010  . Bipolar disorder ([place])   . Cancer ([place])    breast mets to brain  . Chronic pain   . Complication of anesthesia   . Depression   . Family history of adverse reaction to anesthesia    parents had PONV  . Fibromyalgia Dx 2005  . GERD (gastroesophageal reflux disease)   . Headache    hx  migraines  . Lymphedema of left arm   . Opiate dependence ([facility] [place])   . PONV  (postoperative nausea and vomiting)   . Port-A-Cath in place   . PTSD (post-traumatic stress disorder)     Past Surgical History:  Procedure Laterality Date  . APPLICATION OF CRANIAL NAVIGATION N/A 08/14/2016   Procedure: APPLICATION OF CRANIAL NAVIGATION;  Surgeon: Erline Levine, MD;  Location: [facility];  Service: Neurosurgery;  Laterality: N/A;  . BREAST RECONSTRUCTION Left    with silicone implant  . CRANIOTOMY N/A 08/14/2016   Procedure: CRANIOTOMY TUMOR EXCISION WITH Lucky Rathke;  Surgeon: Erline Levine, MD;  Location: [facility];  Service: Neurosurgery;  Laterality: N/A;  . FIBULA FRACTURE SURGERY Left   . MASTECTOMY Left   . RADIOLOGY WITH ANESTHESIA N/A 07/23/2016   Procedure: MRI OF BRAIN WITH AND WITHOUT;  Surgeon: Medication Radiologist, MD;  Location: [facility];  Service: Radiology;  Laterality: N/A;  . RADIOLOGY WITH ANESTHESIA N/A 09/08/2016   Procedure: MRI OF BRAIN WITH AND WITHOUT CONTRAST;  Surgeon: Medication Radiologist, MD;  Location: [facility];  Service: Radiology;  Laterality: N/A;  . RADIOLOGY WITH ANESTHESIA N/A 12/10/2016   Procedure: MRI OF BRAIN WITH AND WITHOUT;  Surgeon: Medication Radiologist, MD;  Location: [facility];  Service: Radiology;  Laterality: N/A;  . RADIOLOGY WITH ANESTHESIA N/A 03/02/2017   Procedure: MRI of BRAIN W and W/OUT CONTRAST;  Surgeon: Medication Radiologist, MD;  Location: [facility];  Service: Radiology;  Laterality: N/A;  . RADIOLOGY  WITH ANESTHESIA N/A 07/29/2017   Procedure: RADIOLOGY WITH ANESTHESIA MRI OF BRAIN WITH AND WITHOUT CONTRAST;  Surgeon: Radiologist, Medication, MD;  Location: [facility];  Service: Radiology;  Laterality: N/A;  . RADIOLOGY WITH ANESTHESIA N/A 12/07/2017   Procedure: MRI WITH ANESTHESIA OF BRAIN WITH AND WITHOUT CONTRAST;  Surgeon: Radiologist, Medication, MD;  Location: [facility];  Service: Radiology;  Laterality: N/A;  . right power port placement Right     Allergies  Allergen Reactions  . Demerol Itching and Nausea And Vomiting    . Erythromycin Rash      Outpatient Medications Prior to Visit  Medication Sig Dispense Refill  . anastrozole (ARIMIDEX) 1 MG tablet Take 1 tablet (1 mg total) daily by mouth. 30 tablet 0  . Biotin 1000 MCG tablet Take 1,000 mcg by mouth daily.    . Cholecalciferol (VITAMIN D3) 5000 units CAPS Take 5,000 Units by mouth daily.    . cycloSPORINE (RESTASIS) 0.05 % ophthalmic emulsion Place 1 drop into both eyes 2 (two) times daily.    [name] docusate sodium (COLACE) 100 MG capsule Take 100 mg by mouth daily as needed for mild constipation.     . fluticasone (FLONASE) 50 MCG/ACT nasal spray Place 2 sprays into both nostrils daily. 16 g 2  . gabapentin (NEURONTIN) 300 MG capsule Take 1 capsule (300 mg total) by mouth 2 (two) times daily. 60 capsule 3  . ibuprofen (ADVIL,MOTRIN) 800 MG tablet TAKE 1 TABLET BY MOUTH THREE TIMES A DAY 90 tablet 0  . lamoTRIgine (LAMICTAL) 25 MG tablet Take 1 tablet (25 mg total) by mouth 2 (two) times daily. (Patient taking differently: Take 75 mg by mouth every morning. Pt takes 3 tabs q am) 60 tablet 0  . lurasidone (LATUDA) 40 MG TABS tablet Take 1 tablet (40 mg total) by mouth daily with breakfast. 30 tablet 0  . ondansetron (ZOFRAN) 8 MG tablet Take 1 tablet (8 mg total) by mouth every 8 (eight) hours as needed for nausea or vomiting. 90 tablet 1  . polyethylene glycol (MIRALAX / GLYCOLAX) packet TAKE 17 G BY MOUTH DAILY AS NEEDED FOR MILD CONSTIPATION. 30 packet 0  . traZODone (DESYREL) 100 MG tablet Take 1 tablet (100 mg total) by mouth at bedtime. (Patient taking differently: Take 200 mg by mouth at bedtime. ) 30 tablet 0  . cyclobenzaprine (FLEXERIL) 10 MG tablet Take 1 tablet (10 mg total) by mouth 2 (two) times daily as needed for muscle spasms. 60 tablet 3  . pantoprazole (PROTONIX) 40 MG tablet Take 1 tablet (40 mg total) by mouth daily. 30 tablet 3   No facility-administered medications prior to visit.     ROS Review of Systems  Constitutional:  Negative for activity change, appetite change and fatigue.  HENT: Positive for postnasal drip. Negative for congestion, sinus pressure and sore throat.   Eyes: Negative for visual disturbance.  Respiratory: Negative for cough, chest tightness, shortness of breath and wheezing.   Cardiovascular: Negative for chest pain and palpitations.  Gastrointestinal: Negative for abdominal distention, abdominal pain and constipation.  Endocrine: Negative for polydipsia.  Genitourinary: Negative for dysuria and frequency.  Musculoskeletal: Negative for arthralgias and back pain.  Skin: Negative for rash.  Neurological: Negative for tremors, light-headedness and numbness.  Hematological: Does not bruise/bleed easily.  Psychiatric/Behavioral: Negative for agitation and behavioral problems.    Objective:  BP 116/80   Pulse 90   Temp 98 F (36.7 C) (Oral)   Ht 5\' 5"  (1.651 m)  Wt 157 lb (71.2 kg)   SpO2 97%   BMI 26.13 kg/m   BP/Weight 03/11/2018 02/15/2018 0/62/3762  Systolic BP 831 517 616  Diastolic BP 80 73 60  Wt. (Lbs) 157 155.8 -  BMI 26.13 25.93 -  Some encounter information is confidential and restricted. Go to Review Flowsheets activity to see all data.      Physical Exam  Constitutional: She is oriented to person, place, and time. She appears well-developed and well-nourished.  Cardiovascular: Normal rate, normal heart sounds and intact distal pulses.  No murmur heard. Pulmonary/Chest: Effort normal and breath sounds normal. She has no wheezes. She has no rales. She exhibits no tenderness.  Abdominal: Soft. Bowel sounds are normal. She exhibits no distension and no mass. There is no tenderness.  Musculoskeletal: Normal range of motion. She exhibits edema (L upper extremity edema).  Neurological: She is alert and oriented to person, place, and time.  Skin: Skin is warm and dry.  Psychiatric: She has a normal mood and affect.     Assessment & Plan:   1. Chronic nonintractable  headache, unspecified headache type Improved on Flexeril - cyclobenzaprine (FLEXERIL) 10 MG tablet; Take 1 tablet (10 mg total) by mouth 2 (two) times daily as needed for muscle spasms.  Dispense: 60 tablet; Refill: 3  2. 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 No evidence of active disease per oncology. Currently wearing left arm sleeve for lymphedema Undergoing physical therapy and doing well   3. Bipolar I disorder, most recent episode depressed (HCC) Stable Continue Lamictal, Latuda Followed by psych, Dr. Darleene Cleaver  4. Screening for colon cancer - Ambulatory referral to Gastroenterology  5. Need for hepatitis C screening test - Hepatitis c antibody (reflex)  6. Muscle spasm Controlled - cyclobenzaprine (FLEXERIL) 10 MG tablet; Take 1 tablet (10 mg total) by mouth 2 (two) times daily as needed for muscle spasms.  Dispense: 60 tablet; Refill: 3  7. Gastroesophageal reflux disease without esophagitis Stable - pantoprazole (PROTONIX) 40 MG tablet; Take 1 tablet (40 mg total) by mouth daily.  Dispense: 30 tablet; Refill: 3  8. Seasonal allergies Uncontrolled Continue Zyrtec and Flonase Advised to use a mask when she has to work in Teacher, adult education care   Meds ordered this encounter  Medications  . pantoprazole (PROTONIX) 40 MG tablet    Sig: Take 1 tablet (40 mg total) by mouth daily.    Dispense:  30 tablet    Refill:  3  . cyclobenzaprine (FLEXERIL) 10 MG tablet    Sig: Take 1 tablet (10 mg total) by mouth 2 (two) times daily as needed for muscle spasms.    Dispense:  60 tablet    Refill:  3    Follow-up: Return  in about 6 months (around 09/10/2018) for follow of chronic medical conditions.   Charlott Rakes MD

## 2018-03-12 LAB — HEPATITIS C ANTIBODY (REFLEX): HCV Ab: <0.1 s/co ratio (ref 0.0–0.9)

## 2018-03-12 LAB — HCV COMMENT:

## 2018-03-14 ENCOUNTER — Telehealth: Payer: Self-pay | Admitting: Oncology

## 2018-03-14 NOTE — Telephone Encounter (Signed)
Patient called to reschedule and val said ok

## 2018-03-15 ENCOUNTER — Ambulatory Visit: Payer: Self-pay

## 2018-03-15 ENCOUNTER — Other Ambulatory Visit: Payer: Self-pay

## 2018-03-16 ENCOUNTER — Other Ambulatory Visit: Payer: Self-pay | Admitting: Family Medicine

## 2018-03-16 DIAGNOSIS — C50312 Malignant neoplasm of lower-inner quadrant of left female breast: Secondary | ICD-10-CM

## 2018-03-25 ENCOUNTER — Telehealth: Payer: Self-pay | Admitting: Internal Medicine

## 2018-03-25 ENCOUNTER — Inpatient Hospital Stay: Payer: Medicaid Other

## 2018-03-25 NOTE — Telephone Encounter (Signed)
Spoke w/ pt and r/s appts for today to next week due to them having a grandchild.

## 2018-03-29 ENCOUNTER — Inpatient Hospital Stay: Payer: Medicaid Other

## 2018-03-29 ENCOUNTER — Encounter: Payer: Self-pay | Admitting: Gastroenterology

## 2018-03-29 ENCOUNTER — Inpatient Hospital Stay: Payer: Medicaid Other | Attending: Medical

## 2018-03-29 VITALS — BP 118/79 | HR 92 | Temp 98.9°F | Resp 17 | Ht 65.0 in | Wt 157.0 lb

## 2018-03-29 DIAGNOSIS — C7951 Secondary malignant neoplasm of bone: Secondary | ICD-10-CM | POA: Insufficient documentation

## 2018-03-29 DIAGNOSIS — Z5112 Encounter for antineoplastic immunotherapy: Secondary | ICD-10-CM | POA: Diagnosis present

## 2018-03-29 DIAGNOSIS — C50312 Malignant neoplasm of lower-inner quadrant of left female breast: Secondary | ICD-10-CM

## 2018-03-29 DIAGNOSIS — C7931 Secondary malignant neoplasm of brain: Secondary | ICD-10-CM | POA: Insufficient documentation

## 2018-03-29 LAB — CBC WITH DIFFERENTIAL/PLATELET
Basophils Absolute: 0 10*3/uL (ref 0.0–0.1)
Basophils Relative: 0 %
Eosinophils Absolute: 0 10*3/uL (ref 0.0–0.5)
Eosinophils Relative: 1 %
HCT: 37.4 % (ref 34.8–46.6)
Hemoglobin: 12.8 g/dL (ref 11.6–15.9)
Lymphocytes Relative: 14 %
Lymphs Abs: 0.8 10*3/uL — ABNORMAL LOW (ref 0.9–3.3)
MCH: 32.3 pg (ref 25.1–34.0)
MCHC: 34.2 g/dL (ref 31.5–36.0)
MCV: 94.6 fL (ref 79.5–101.0)
Monocytes Absolute: 0.4 10*3/uL (ref 0.1–0.9)
Monocytes Relative: 8 %
Neutro Abs: 4.3 10*3/uL (ref 1.5–6.5)
Neutrophils Relative %: 77 %
Platelets: 176 10*3/uL (ref 145–400)
RBC: 3.95 MIL/uL (ref 3.70–5.45)
RDW: 12.6 % (ref 11.2–14.5)
WBC: 5.6 10*3/uL (ref 3.9–10.3)

## 2018-03-29 LAB — COMPREHENSIVE METABOLIC PANEL
ALT: 15 U/L (ref 0–55)
AST: 18 U/L (ref 5–34)
Albumin: 3.8 g/dL (ref 3.5–5.0)
Alkaline Phosphatase: 59 U/L (ref 40–150)
Anion gap: 9 (ref 3–11)
BUN: 11 mg/dL (ref 7–26)
CO2: 27 mmol/L (ref 22–29)
Calcium: 9.6 mg/dL (ref 8.4–10.4)
Chloride: 105 mmol/L (ref 98–109)
Creatinine, Ser: 0.95 mg/dL (ref 0.60–1.10)
GFR calc Af Amer: >60 mL/min (ref >60)
GFR calc non Af Amer: >60 mL/min (ref >60)
Glucose, Bld: 99 mg/dL (ref 70–140)
Potassium: 3.9 mmol/L (ref 3.5–5.1)
Sodium: 141 mmol/L (ref 136–145)
Total Bilirubin: <0.2 mg/dL — ABNORMAL LOW (ref 0.2–1.2)
Total Protein: 6.9 g/dL (ref 6.4–8.3)

## 2018-03-29 MED ORDER — DENOSUMAB 120 MG/1.7ML ~~LOC~~ SOLN
SUBCUTANEOUS | Status: AC
  Filled 2018-03-29: qty 1.7

## 2018-03-29 MED ORDER — SODIUM CHLORIDE 0.9 % IV SOLN
Freq: Once | INTRAVENOUS | Status: AC
  Administered 2018-03-29: 10:00:00 via INTRAVENOUS

## 2018-03-29 MED ORDER — DIPHENHYDRAMINE HCL 25 MG PO CAPS: 25.0000 mg | ORAL_CAPSULE | Freq: Once | ORAL | Status: DC

## 2018-03-29 MED ORDER — TRASTUZUMAB CHEMO 150 MG IV SOLR
450.0000 mg | Freq: Once | INTRAVENOUS | Status: AC
  Administered 2018-03-29: 450 mg via INTRAVENOUS
  Filled 2018-03-29: qty 21.43

## 2018-03-29 MED ORDER — HEPARIN SOD (PORK) LOCK FLUSH 100 UNIT/ML IV SOLN
500.0000 [IU] | Freq: Once | INTRAVENOUS | Status: AC | PRN
  Administered 2018-03-29: 500 [IU]
  Filled 2018-03-29: qty 5

## 2018-03-29 MED ORDER — DENOSUMAB 120 MG/1.7ML ~~LOC~~ SOLN
120.0000 mg | Freq: Once | SUBCUTANEOUS | Status: AC
  Administered 2018-03-29: 120 mg via SUBCUTANEOUS

## 2018-03-29 MED ORDER — SODIUM CHLORIDE 0.9% FLUSH
10.0000 mL | INTRAVENOUS | Status: DC | PRN
  Administered 2018-03-29: 10 mL
  Filled 2018-03-29: qty 10

## 2018-03-29 MED ORDER — ACETAMINOPHEN 325 MG PO TABS: 650.0000 mg | ORAL_TABLET | Freq: Once | ORAL | Status: DC

## 2018-03-29 NOTE — Patient Instructions (Signed)
[facility] Discharge Instructions for Patients Receiving Chemotherapy Today you received the following chemotherapy agents:  Herceptin To help prevent nausea and vomiting after your treatment, we encourage you to take your nausea medication as prescribed.   If you develop nausea and vomiting that is not controlled by your nausea medication, call the clinic.   BELOW ARE SYMPTOMS THAT SHOULD BE REPORTED IMMEDIATELY:  *FEVER GREATER THAN 100.5 F  *CHILLS WITH OR WITHOUT FEVER  NAUSEA AND VOMITING THAT IS NOT CONTROLLED WITH YOUR NAUSEA MEDICATION  *UNUSUAL SHORTNESS OF BREATH  *UNUSUAL BRUISING OR BLEEDING  TENDERNESS IN MOUTH AND THROAT WITH OR WITHOUT PRESENCE OF ULCERS  *URINARY PROBLEMS  *BOWEL PROBLEMS  UNUSUAL RASH Items with * indicate a potential emergency and should be followed up as soon as possible.  Feel free to call the clinic should you have any questions or concerns. The clinic phone number is [phone].  Please show the CHEMO ALERT CARD at check-in to the Emergency Department and triage nurse.   

## 2018-03-30 ENCOUNTER — Ambulatory Visit: Payer: Medicaid Other | Attending: Family Medicine | Admitting: Physician Assistant

## 2018-03-30 ENCOUNTER — Other Ambulatory Visit: Payer: Self-pay | Admitting: *Deleted

## 2018-03-30 VITALS — BP 126/84 | HR 95 | Temp 98.3°F | Resp 16 | Wt 159.8 lb

## 2018-03-30 DIAGNOSIS — Z79899 Other long term (current) drug therapy: Secondary | ICD-10-CM | POA: Diagnosis not present

## 2018-03-30 DIAGNOSIS — K219 Gastro-esophageal reflux disease without esophagitis: Secondary | ICD-10-CM | POA: Diagnosis not present

## 2018-03-30 DIAGNOSIS — F1120 Opioid dependence, uncomplicated: Secondary | ICD-10-CM | POA: Insufficient documentation

## 2018-03-30 DIAGNOSIS — R0989 Other specified symptoms and signs involving the circulatory and respiratory systems: Secondary | ICD-10-CM | POA: Diagnosis present

## 2018-03-30 DIAGNOSIS — R05 Cough: Secondary | ICD-10-CM | POA: Diagnosis present

## 2018-03-30 DIAGNOSIS — Z885 Allergy status to narcotic agent status: Secondary | ICD-10-CM | POA: Diagnosis not present

## 2018-03-30 DIAGNOSIS — G8929 Other chronic pain: Secondary | ICD-10-CM | POA: Diagnosis not present

## 2018-03-30 DIAGNOSIS — F319 Bipolar disorder, unspecified: Secondary | ICD-10-CM | POA: Diagnosis not present

## 2018-03-30 DIAGNOSIS — M797 Fibromyalgia: Secondary | ICD-10-CM | POA: Diagnosis not present

## 2018-03-30 DIAGNOSIS — F4310 Post-traumatic stress disorder, unspecified: Secondary | ICD-10-CM | POA: Insufficient documentation

## 2018-03-30 DIAGNOSIS — Z853 Personal history of malignant neoplasm of breast: Secondary | ICD-10-CM | POA: Insufficient documentation

## 2018-03-30 DIAGNOSIS — J40 Bronchitis, not specified as acute or chronic: Secondary | ICD-10-CM | POA: Diagnosis not present

## 2018-03-30 DIAGNOSIS — Z881 Allergy status to other antibiotic agents status: Secondary | ICD-10-CM | POA: Diagnosis not present

## 2018-03-30 DIAGNOSIS — C7931 Secondary malignant neoplasm of brain: Secondary | ICD-10-CM | POA: Diagnosis not present

## 2018-03-30 DIAGNOSIS — J029 Acute pharyngitis, unspecified: Secondary | ICD-10-CM | POA: Diagnosis present

## 2018-03-30 LAB — CANCER ANTIGEN 27.29: CA 27.29: 11.7 U/mL (ref 0.0–38.6)

## 2018-03-30 MED ORDER — AZITHROMYCIN 250 MG PO TABS: ORAL_TABLET | ORAL | 0 refills | Status: DC

## 2018-03-30 MED ORDER — BENZONATATE 100 MG PO CAPS: 200.0000 mg | ORAL_CAPSULE | Freq: Three times a day (TID) | ORAL | 0 refills | Status: DC | PRN

## 2018-03-30 NOTE — Progress Notes (Signed)
Patient ID: [name], female   DOB: [birth year], 57 y.o.   MRN: [id number]     [name], is a 57 y.o. female  UTM:[id number]  FKC:[id number]  DOB - 1961/09/14  Subjective:  Chief Complaint and HPI: [name] is a 57 y.o. female here today to for Congestion, bad Sore throat, cough with green mucus.  No f/c.  She has been sick for about 1 week.  Taking mucinex D, Claritin, and flonase.   ROS:   Constitutional:  No f/c, No night sweats, No unexplained weight loss. EENT:  No vision changes, No blurry vision, No hearing changes. No additional mouth, throat, or ear problems.  Respiratory: + cough, No SOB Cardiac: No CP, no palpitations GI:  No abd pain, No N/V/D. GU: No Urinary s/sx Musculoskeletal: No joint pain Neuro: No headache, no dizziness, no motor weakness.  Skin: No rash Endocrine:  No polydipsia. No polyuria.  Psych: Denies SI/HI  No problems updated.  ALLERGIES: Allergies  Allergen Reactions  . Demerol Itching and Nausea And Vomiting  . Erythromycin Rash    PAST MEDICAL HISTORY: Past Medical History:  Diagnosis Date  . Alcohol abuse   . Anemia    during chemo  . Anxiety    At age 63  . Arthritis Dx 2010  . Bipolar disorder ([place])   . Cancer ([place])    breast mets to brain  . Chronic pain   . Complication of anesthesia   . Depression   . Family history of adverse reaction to anesthesia    parents had PONV  . Fibromyalgia Dx 2005  . GERD (gastroesophageal reflux disease)   . Headache    hx  migraines  . Lymphedema of left arm   . Opiate dependence ([place])   . PONV (postoperative nausea and vomiting)   . Port-A-Cath in place   . PTSD (post-traumatic stress disorder)     MEDICATIONS AT HOME: Prior to Admission medications   Medication Sig Start Date End Date Taking? Authorizing Provider  anastrozole (ARIMIDEX) 1 MG tablet Take 1 tablet (1 mg total) daily by mouth. 10/15/17   Magrinat, Virgie Dad, MD  azithromycin (ZITHROMAX) 250 MG tablet Take 2  today then 1 daily 03/30/18   Argentina Donovan, PA-C  benzonatate (TESSALON) 100 MG capsule Take 2 capsules (200 mg total) by mouth 3 (three) times daily as needed for cough. 03/30/18   Argentina Donovan, PA-C  Biotin 1000 MCG tablet Take 1,000 mcg by mouth daily.    [provider]  Cholecalciferol (VITAMIN D3) 5000 units CAPS Take 5,000 Units by mouth daily.    [provider]  cyclobenzaprine (FLEXERIL) 10 MG tablet Take 1 tablet (10 mg total) by mouth 2 (two) times daily as needed for muscle spasms. 03/11/18   Charlott Rakes, MD  cycloSPORINE (RESTASIS) 0.05 % ophthalmic emulsion Place 1 drop into both eyes 2 (two) times daily.    [provider]  docusate sodium (COLACE) 100 MG capsule Take 100 mg by mouth daily as needed for mild constipation.     [provider]  fluticasone (FLONASE) 50 MCG/ACT nasal spray SPRAY 2 SPRAYS INTO EACH NOSTRIL EVERY DAY 03/16/18   Charlott Rakes, MD  gabapentin (NEURONTIN) 300 MG capsule Take 1 capsule (300 mg total) by mouth 2 (two) times daily. 12/10/17   Charlott Rakes, MD  ibuprofen (ADVIL,MOTRIN) 800 MG tablet TAKE 1 TABLET BY MOUTH THREE TIMES A DAY 02/28/18   Magrinat, Virgie Dad, MD  lamoTRIgine (LAMICTAL) 25  MG tablet Take 1 tablet (25 mg total) by mouth 2 (two) times daily. Patient taking differently: Take 75 mg by mouth every morning. Pt takes 3 tabs q am 04/30/17   Florencia Reasons, MD  lurasidone (LATUDA) 40 MG TABS tablet Take 1 tablet (40 mg total) by mouth daily with breakfast. 05/01/17   Florencia Reasons, MD  ondansetron (ZOFRAN) 8 MG tablet Take 1 tablet (8 mg total) by mouth every 8 (eight) hours as needed for nausea or vomiting. 02/11/18   Bruning, [name], PA-C  pantoprazole (PROTONIX) 40 MG tablet Take 1 tablet (40 mg total) by mouth daily. 03/11/18   Charlott Rakes, MD  polyethylene glycol (MIRALAX / GLYCOLAX) packet TAKE 17 G BY MOUTH DAILY AS NEEDED FOR MILD CONSTIPATION. 11/03/17   Magrinat, Virgie Dad, MD  traZODone (DESYREL) 100 MG  tablet Take 1 tablet (100 mg total) by mouth at bedtime. Patient taking differently: Take 200 mg by mouth at bedtime.  04/30/17   Florencia Reasons, MD     Objective:  EXAM:   Vitals:   03/30/18 0917  BP: 126/84  Pulse: 95  Resp: 16  Temp: 98.3 F (36.8 C)  TempSrc: Oral  SpO2: 96%  Weight: 159 lb 12.8 oz (72.5 kg)    General appearance : A&OX3. NAD. Non-toxic-appearing HEENT: Atraumatic and Normocephalic.  PERRLA. EOM intact.  TM full on L and bulging.  R TM WNL. Mouth-MMM, post pharynx WNL w/ mild erythema, + PND. Neck: supple, no JVD. No cervical lymphadenopathy. No thyromegaly Chest/Lungs:  Breathing-non-labored, Good air entry bilaterally, breath sounds normal without rales, rhonchi, or wheezing  CVS: S1 S2 regular, no murmurs, gallops, rubs  Extremities: Bilateral Lower Ext shows no edema, both legs are warm to touch with = pulse throughout Neurology:  CN II-XII grossly intact, Non focal.   Psych:  TP linear. J/I WNL. Normal speech. Appropriate eye contact and affect.  Skin:  No Rash  Data Review Lab Results  Component Value Date   HGBA1C 5.1 12/10/2017   HGBA1C 5.60 02/28/2015     Assessment & Plan   1. Bronchitis Cover for atypicals - azithromycin (ZITHROMAX) 250 MG tablet; Take 2 today then 1 daily  Dispense: 6 tablet; Refill: 0 - benzonatate (TESSALON) 100 MG capsule; Take 2 capsules (200 mg total) by mouth 3 (three) times daily as needed for cough.  Dispense: 40 capsule; Refill: 0   Patient have been counseled extensively about nutrition and exercise  Return in about 3 months (around 06/30/2018) for Dr Margarita Rana; medical issues.  The patient was given clear instructions to go to ER or return to medical center if symptoms don't improve, worsen or new problems develop. The patient verbalized understanding. The patient was told to call to get lab results if they haven't heard anything in the next week.   Freeman Caldron, PA-C [facility], [place]   03/30/2018, 9:24 AM

## 2018-04-03 IMAGING — CR DG CHEST 1V PORT
2 series · 2 of 2 positions shown · non-contrast
Comparison: Chest CT 06/17/2016

CLINICAL DATA: Central line placement

EXAM:
PORTABLE CHEST 1 VIEW

[AP (1 of 2)]
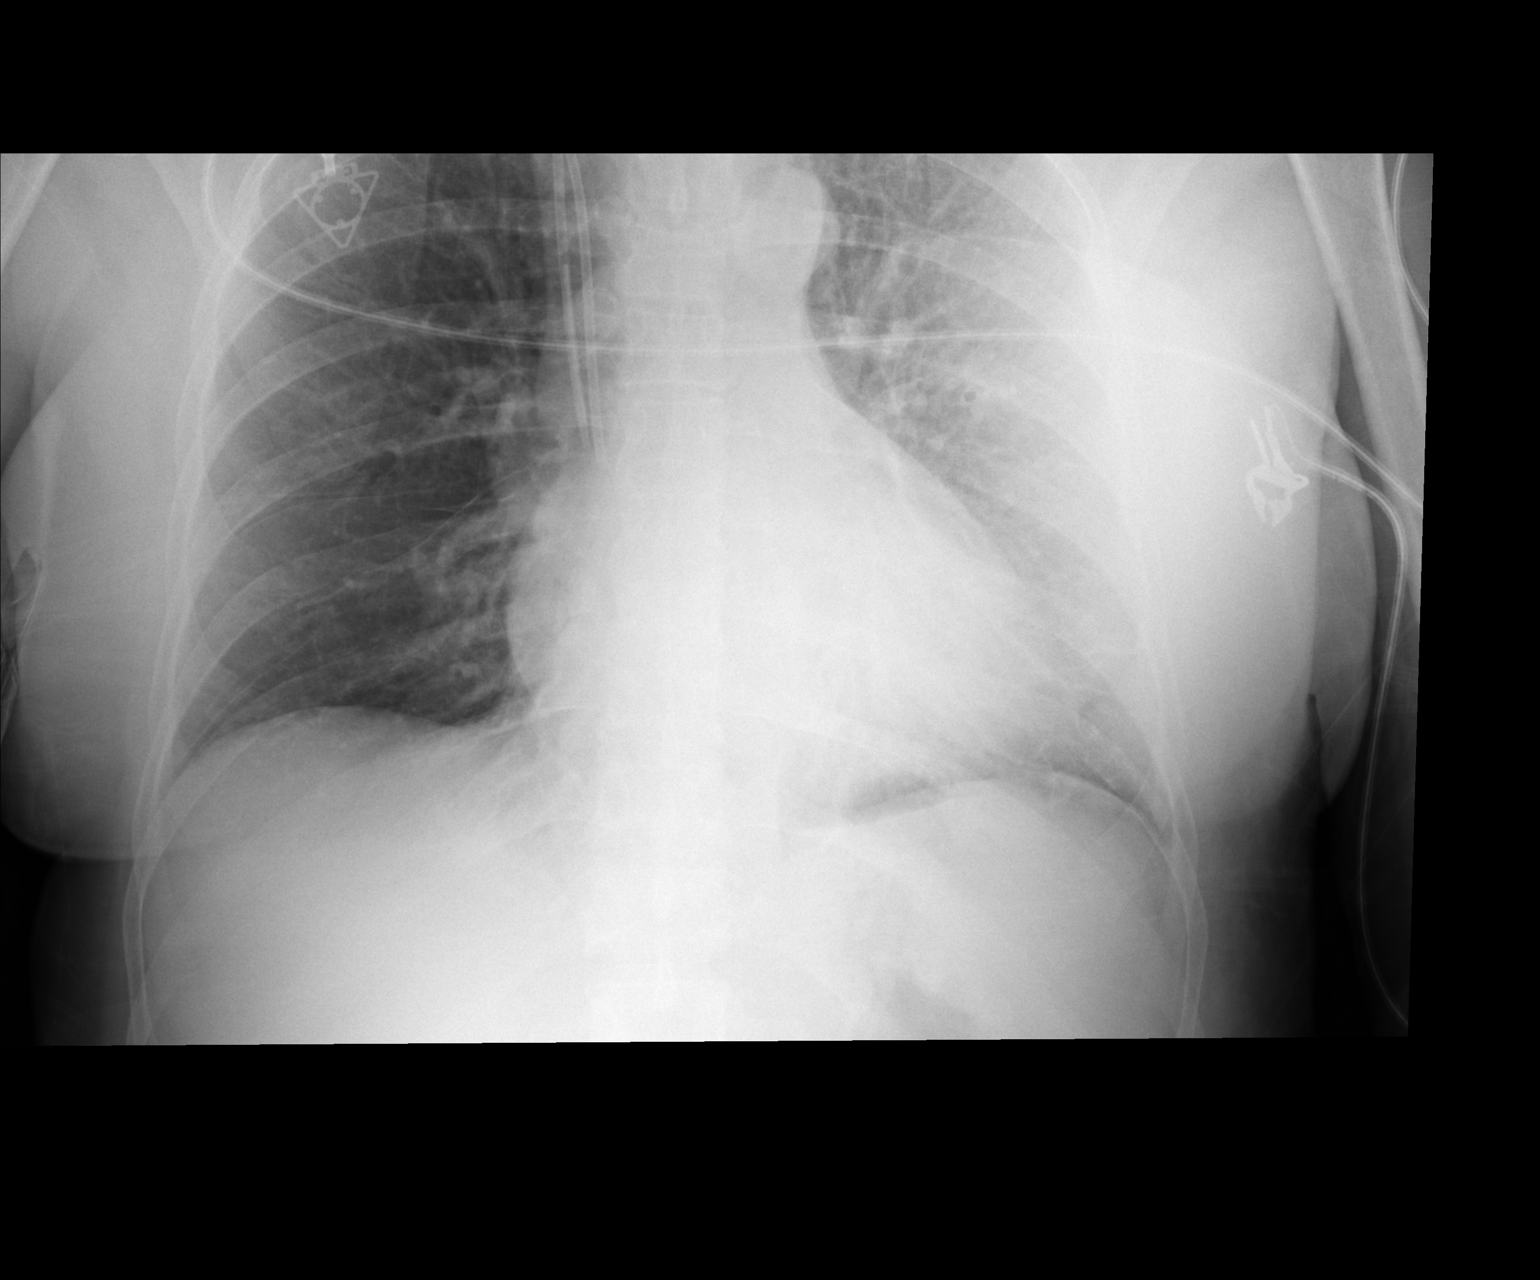

[AP (2 of 2)]
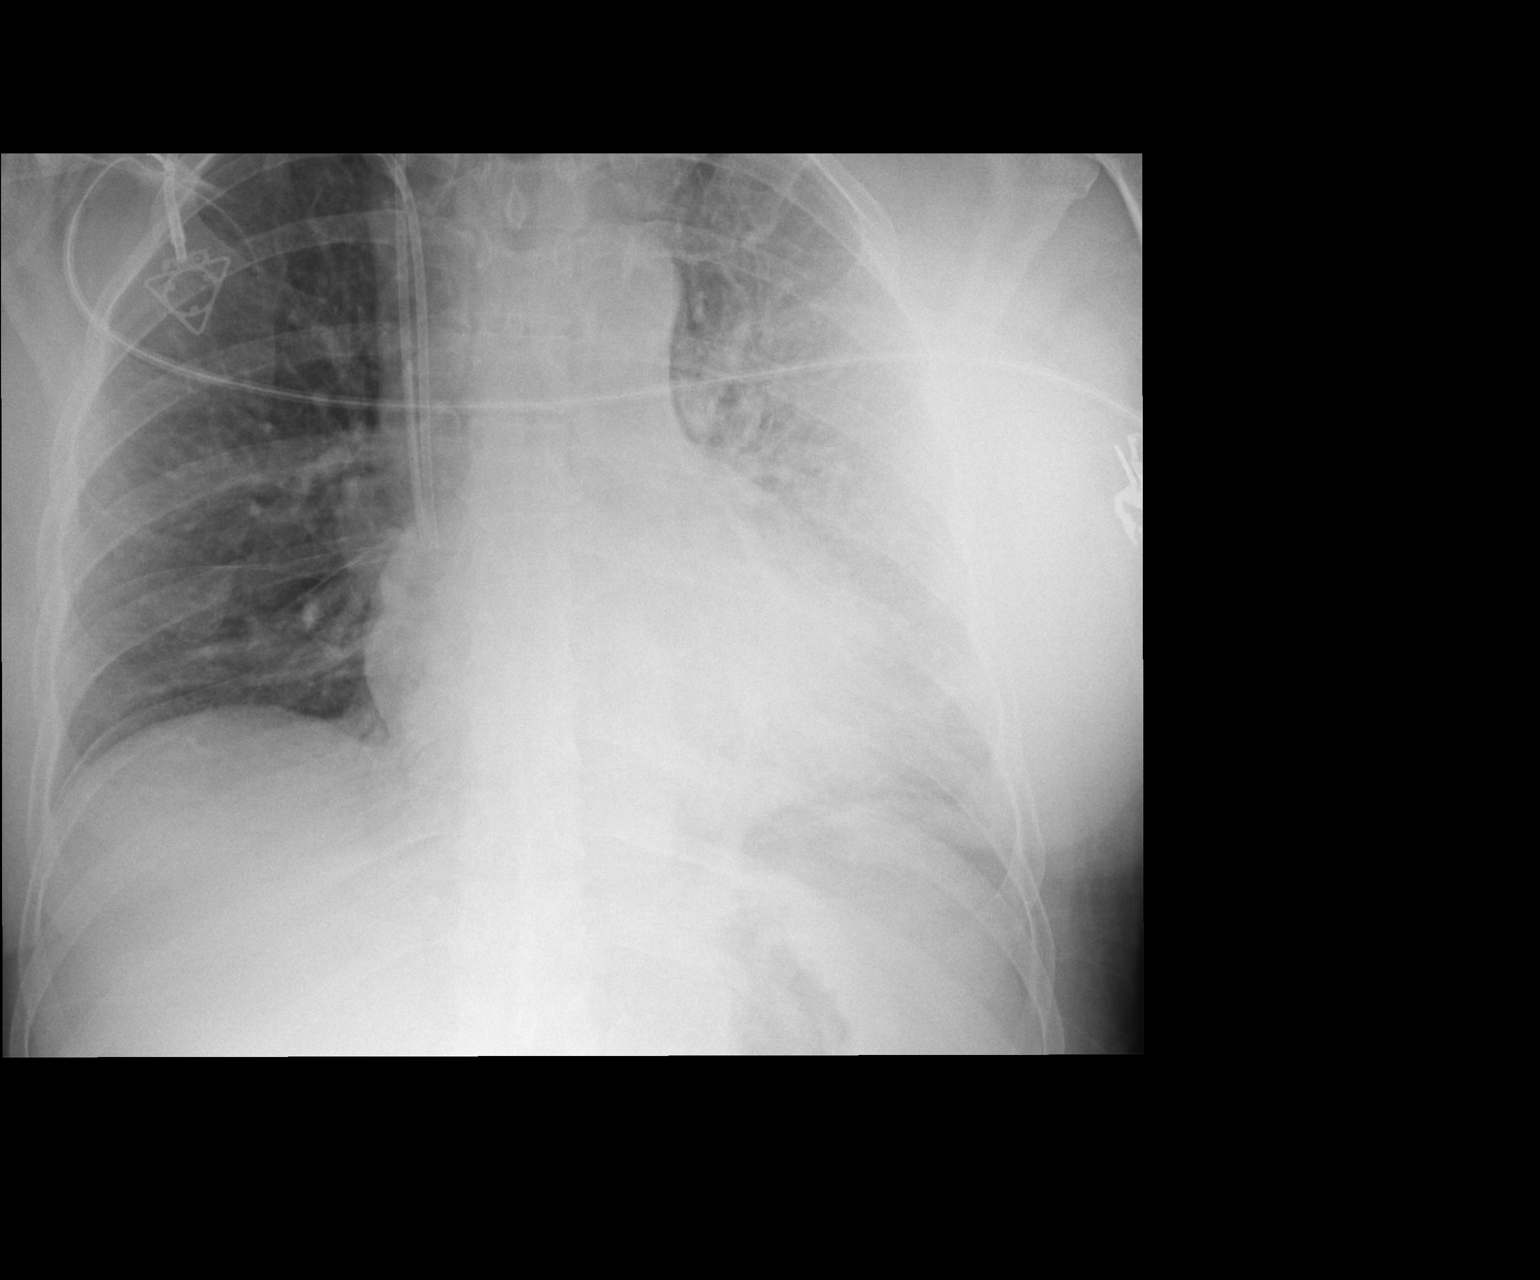

[2 of 2 positions shown; findings below may reference images not displayed]

FINDINGS: Right Port-A-Cath in place with the tip at the cavoatrial junction.
Right internal jugular central line tip at the cavoatrial junction.
No pneumothorax. Heart is normal size. Right lung is clear. Diffuse
airspace opacity throughout the left lung concerning for pneumonia.
No acute bony abnormality.
IMPRESSION: Right central line tip at the cavoatrial junction.  No pneumothorax.

Diffuse opacity throughout the left lung concerning for pneumonia.

## 2018-04-04 ENCOUNTER — Telehealth: Payer: Self-pay | Admitting: Family Medicine

## 2018-04-04 NOTE — Telephone Encounter (Signed)
Pt aware of message per Dr. Margarita Rana. She states her ear is stopped up. Would like an OV with provider. Call transferred to find apt.

## 2018-04-04 NOTE — Telephone Encounter (Signed)
Patient called stating that she saw the walkin provider on 03-30-18 and states that her left ear is still stuffed up and will like to speak to the nurse since she does not know if she will need more medication. Please follow up [phone]

## 2018-04-04 NOTE — Telephone Encounter (Signed)
Pt was seen on 03/30/18.  She is  taking Flonase, Claritin and completed ATB- zithromycin. Pt informed although completed mediations, may take a while to get rid of sx's completely.She states her ear is still clogged and noticing discomfort. Please advise.

## 2018-04-04 NOTE — Telephone Encounter (Signed)
Left message on voicemail to return call.  May inform patient of message from Dr. Margarita Rana when she returns call.

## 2018-04-04 NOTE — Telephone Encounter (Signed)
Patient returned nurse call, tried to explain what PCP Stated but had further questions

## 2018-04-04 NOTE — Telephone Encounter (Signed)
Recommend continuing Flonase, Claritin which she can substitute with Zyrtec.  Notify the clinic in a few days if worsening.

## 2018-04-05 ENCOUNTER — Encounter (HOSPITAL_COMMUNITY): Payer: Self-pay | Admitting: *Deleted

## 2018-04-05 ENCOUNTER — Other Ambulatory Visit: Payer: Self-pay

## 2018-04-05 NOTE — Progress Notes (Signed)
Pt denies any acute cardiopulmonary issues. Pt under the care of Dr. Jason Nest, Cardiology. Pt denies having a cardiac cath and stress test. Pt denies having a chest x ray within the last year; pt has had chest CT's. . Pt made aware to stop taking vitamins,  Biotin, and herbal medications. Pt verbalized understanding of all pre-op instructions.

## 2018-04-07 ENCOUNTER — Ambulatory Visit (HOSPITAL_COMMUNITY)
Admission: RE | Admit: 2018-04-07 | Discharge: 2018-04-07 | Disposition: A | Discharge status: Home / Self Care | Payer: Medicaid Other | Source: Ambulatory Visit | Attending: Radiation Oncology | Admitting: Radiation Oncology

## 2018-04-07 ENCOUNTER — Other Ambulatory Visit: Payer: Self-pay

## 2018-04-07 ENCOUNTER — Encounter (HOSPITAL_COMMUNITY): Payer: Self-pay

## 2018-04-07 ENCOUNTER — Ambulatory Visit (HOSPITAL_COMMUNITY): Payer: Medicaid Other | Admitting: Certified Registered"

## 2018-04-07 ENCOUNTER — Ambulatory Visit (HOSPITAL_BASED_OUTPATIENT_CLINIC_OR_DEPARTMENT_OTHER): Payer: Medicaid Other | Admitting: Physician Assistant

## 2018-04-07 ENCOUNTER — Encounter (HOSPITAL_COMMUNITY)
Admission: RE | Disposition: A | Discharge status: Home / Self Care | Payer: Self-pay | Source: Ambulatory Visit | Attending: Radiation Oncology

## 2018-04-07 VITALS — BP 129/84 | HR 107 | Temp 98.8°F | Resp 16 | Ht 65.0 in | Wt 162.8 lb

## 2018-04-07 DIAGNOSIS — F319 Bipolar disorder, unspecified: Secondary | ICD-10-CM | POA: Insufficient documentation

## 2018-04-07 DIAGNOSIS — M797 Fibromyalgia: Secondary | ICD-10-CM | POA: Diagnosis not present

## 2018-04-07 DIAGNOSIS — Z923 Personal history of irradiation: Secondary | ICD-10-CM | POA: Diagnosis not present

## 2018-04-07 DIAGNOSIS — M1990 Unspecified osteoarthritis, unspecified site: Secondary | ICD-10-CM | POA: Diagnosis not present

## 2018-04-07 DIAGNOSIS — F419 Anxiety disorder, unspecified: Secondary | ICD-10-CM | POA: Insufficient documentation

## 2018-04-07 DIAGNOSIS — F4310 Post-traumatic stress disorder, unspecified: Secondary | ICD-10-CM | POA: Diagnosis not present

## 2018-04-07 DIAGNOSIS — Z885 Allergy status to narcotic agent status: Secondary | ICD-10-CM | POA: Diagnosis not present

## 2018-04-07 DIAGNOSIS — K219 Gastro-esophageal reflux disease without esophagitis: Secondary | ICD-10-CM | POA: Diagnosis not present

## 2018-04-07 DIAGNOSIS — H6690 Otitis media, unspecified, unspecified ear: Secondary | ICD-10-CM | POA: Insufficient documentation

## 2018-04-07 DIAGNOSIS — Z853 Personal history of malignant neoplasm of breast: Secondary | ICD-10-CM | POA: Insufficient documentation

## 2018-04-07 DIAGNOSIS — Z23 Encounter for immunization: Secondary | ICD-10-CM

## 2018-04-07 DIAGNOSIS — F17200 Nicotine dependence, unspecified, uncomplicated: Secondary | ICD-10-CM | POA: Diagnosis not present

## 2018-04-07 DIAGNOSIS — H748X2 Other specified disorders of left middle ear and mastoid: Secondary | ICD-10-CM | POA: Insufficient documentation

## 2018-04-07 DIAGNOSIS — C7949 Secondary malignant neoplasm of other parts of nervous system: Secondary | ICD-10-CM

## 2018-04-07 DIAGNOSIS — Z79899 Other long term (current) drug therapy: Secondary | ICD-10-CM | POA: Insufficient documentation

## 2018-04-07 DIAGNOSIS — Z85841 Personal history of malignant neoplasm of brain: Secondary | ICD-10-CM | POA: Insufficient documentation

## 2018-04-07 DIAGNOSIS — C7931 Secondary malignant neoplasm of brain: Secondary | ICD-10-CM

## 2018-04-07 DIAGNOSIS — Z881 Allergy status to other antibiotic agents status: Secondary | ICD-10-CM | POA: Insufficient documentation

## 2018-04-07 DIAGNOSIS — Z08 Encounter for follow-up examination after completed treatment for malignant neoplasm: Secondary | ICD-10-CM | POA: Diagnosis present

## 2018-04-07 HISTORY — DX: Bronchitis, not specified as acute or chronic: J40

## 2018-04-07 HISTORY — PX: RADIOLOGY WITH ANESTHESIA: SHX6223

## 2018-04-07 SURGERY — MRI WITH ANESTHESIA
Anesthesia: General

## 2018-04-07 MED ORDER — GADOBENATE DIMEGLUMINE 529 MG/ML IV SOLN
15.0000 mL | Freq: Once | INTRAVENOUS | Status: AC
  Administered 2018-04-07: 15 mL via INTRAVENOUS

## 2018-04-07 MED ORDER — FLUCONAZOLE 150 MG PO TABS: 150.0000 mg | ORAL_TABLET | Freq: Once | ORAL | 0 refills | Status: AC

## 2018-04-07 MED ORDER — MEASLES, MUMPS & RUBELLA VAC ~~LOC~~ INJ
0.5000 mL | INJECTION | Freq: Once | SUBCUTANEOUS | Status: AC
  Administered 2018-04-07: 0.5 mL via SUBCUTANEOUS

## 2018-04-07 MED ORDER — AMOXICILLIN 500 MG PO CAPS: 1000.0000 mg | ORAL_CAPSULE | Freq: Two times a day (BID) | ORAL | 0 refills | Status: DC

## 2018-04-07 MED ORDER — MEASLES, MUMPS & RUBELLA VAC ~~LOC~~ INJ: 0.5000 mL | INJECTION | Freq: Once | SUBCUTANEOUS | 0 refills | Status: DC

## 2018-04-07 NOTE — Progress Notes (Signed)
Patient ID: [name], female   DOB: 11/01/1961, 57 y.o.   MRN: [id number]     [name], is a 57 y.o. female  KDX:[id number]  [id number]  DOB - 01/25/1961  Subjective:  Chief Complaint and HPI: [name] is a 57 y.o. female here today Still having L ear congestion and pain s/p completing antibiotics.   Pulse ox 94% this morning at MRI appt. Had f/up MRI for s/p 2 years out from brain tumor.  Denies SOB at all.    Wants MMR immunization bc travelling to [place] in a few weeks and there is an outbreak there.     ROS:   Constitutional:  No f/c, No night sweats, No unexplained weight loss. EENT:  No vision changes, No blurry vision, No hearing changes. No additional mouth, throat, or ear problems.  Respiratory: No cough, No SOB Cardiac: No CP, no palpitations GI:  No abd pain, No N/V/D. GU: No Urinary s/sx Musculoskeletal: No joint pain Neuro: No headache, no dizziness, no motor weakness.  Skin: No rash Endocrine:  No polydipsia. No polyuria.  Psych: Denies SI/HI  No problems updated.  ALLERGIES: Allergies  Allergen Reactions  . Demerol Itching and Nausea And Vomiting  . Erythromycin Rash    PAST MEDICAL HISTORY: Past Medical History:  Diagnosis Date  . Alcohol abuse   . Anemia    during chemo  . Anxiety    At age 27  . Arthritis Dx 2010  . Bipolar disorder ([place])   . Bronchitis   . Cancer ([place])    breast mets to brain  . Chronic pain   . Complication of anesthesia   . Depression   . Family history of adverse reaction to anesthesia    parents had PONV  . Fibromyalgia Dx 2005  . GERD (gastroesophageal reflux disease)   . Headache    hx  migraines  . Lymphedema of left arm   . Opiate dependence ([place])   . PONV (postoperative nausea and vomiting)   . Port-A-Cath in place   . PTSD (post-traumatic stress disorder)     MEDICATIONS AT HOME: Prior to Admission medications   Medication Sig Start Date End Date Taking? Authorizing Provider    anastrozole (ARIMIDEX) 1 MG tablet Take 1 tablet (1 mg total) daily by mouth. 10/15/17  Yes Magrinat, Virgie Dad, MD  Biotin 1000 MCG tablet Take 1,000 mcg by mouth daily.   Yes [provider]  Cholecalciferol (VITAMIN D3) 5000 units CAPS Take 5,000 Units by mouth daily.   Yes [provider]  cyclobenzaprine (FLEXERIL) 10 MG tablet Take 1 tablet (10 mg total) by mouth 2 (two) times daily as needed for muscle spasms. 03/11/18  Yes Charlott Rakes, MD  cycloSPORINE (RESTASIS) 0.05 % ophthalmic emulsion Place 1 drop into both eyes 2 (two) times daily.   Yes [provider]  docusate sodium (COLACE) 100 MG capsule Take 100 mg by mouth daily as needed for mild constipation.    Yes [provider]  fluticasone (FLONASE) 50 MCG/ACT nasal spray SPRAY 2 SPRAYS INTO EACH NOSTRIL EVERY DAY 03/16/18  Yes Newlin, Enobong, MD  gabapentin (NEURONTIN) 300 MG capsule Take 1 capsule (300 mg total) by mouth 2 (two) times daily. Patient taking differently: Take 300-600 mg by mouth See admin instructions. Take 300 mg by mouth in the morning and take 600 mg by mouth at bedtime 12/10/17  Yes Newlin, Enobong, MD  ibuprofen (ADVIL,MOTRIN) 800 MG tablet TAKE 1 TABLET BY MOUTH THREE TIMES  A DAY Patient taking differently: Take 800 mg by mouth every 8 hours as needed for pain 02/28/18  Yes Magrinat, Virgie Dad, MD  lamoTRIgine (LAMICTAL) 25 MG tablet Take 1 tablet (25 mg total) by mouth 2 (two) times daily. Patient taking differently: Take 75 mg by mouth every morning.  04/30/17  Yes Florencia Reasons, MD  loratadine (CLARITIN) 10 MG tablet Take 10 mg by mouth daily.   Yes [provider]  lurasidone (LATUDA) 40 MG TABS tablet Take 1 tablet (40 mg total) by mouth daily with breakfast. 05/01/17  Yes Florencia Reasons, MD  ondansetron (ZOFRAN) 8 MG tablet Take 1 tablet (8 mg total) by mouth every 8 (eight) hours as needed for nausea or vomiting. 02/11/18  Yes Bruning, [name], PA-C  pantoprazole (PROTONIX) 40 MG  tablet Take 1 tablet (40 mg total) by mouth daily. 03/11/18  Yes Newlin, [name], MD  polyethylene glycol (MIRALAX / GLYCOLAX) packet TAKE 17 G BY MOUTH DAILY AS NEEDED FOR MILD CONSTIPATION. 11/03/17  Yes Magrinat, Virgie Dad, MD  traZODone (DESYREL) 100 MG tablet Take 1 tablet (100 mg total) by mouth at bedtime. Patient taking differently: Take 200 mg by mouth at bedtime.  04/30/17  Yes Florencia Reasons, MD  amoxicillin (AMOXIL) 500 MG capsule Take 2 capsules (1,000 mg total) by mouth 2 (two) times daily. 04/07/18   Argentina Donovan, PA-C  benzonatate (TESSALON) 100 MG capsule Take 2 capsules (200 mg total) by mouth 3 (three) times daily as needed for cough. Patient not taking: Reported on 04/01/2018 03/30/18   Argentina Donovan, PA-C     Objective:  EXAM:   Vitals:   04/07/18 1355  BP: 129/84  Pulse: (!) 108  Resp: 16  Temp: 98.8 F (37.1 C)  TempSrc: Oral  SpO2: 92%  Weight: 162 lb 12.8 oz (73.8 kg)  Height: _0  (1.651 m)    General appearance : A&OX3. NAD. Non-toxic-appearing HEENT: Atraumatic and Normocephalic.  PERRLA. EOM intact.  TM on R WNL.  L TM bulging and erythematous(slightly improved over last time) Mouth-MMM, post pharynx WNL w/o erythema, No PND. Neck: supple, no JVD. No cervical lymphadenopathy. No thyromegaly Chest/Lungs:  Breathing-non-labored, Good air entry bilaterally, breath sounds normal without rales, rhonchi, or wheezing  CVS: S1 S2 regular, no murmurs, gallops, rubs  Extremities: Bilateral Lower Ext shows no edema, both legs are warm to touch with = pulse throughout Neurology:  CN II-XII grossly intact, Non focal.   Psych:  TP linear. J/I WNL. Normal speech. Appropriate eye contact and affect.  Skin:  No Rash  Data Review Lab Results  Component Value Date   HGBA1C 5.1 12/10/2017   HGBA1C 5.60 02/28/2015     Assessment & Plan   1. Recurrent AOM (acute otitis media) Recurrent vs incomplete resoltuion - amoxicillin (AMOXIL) 500 MG capsule; Take 2 capsules  (1,000 mg total) by mouth 2 (two) times daily.  Dispense: 40 capsule; Refill: 0  2. Encounter for immunization - MMR  vaccine subcutaneous     Patient have been counseled extensively about nutrition and exercise  Return in about 3 months (around 07/08/2018) for Dr Margarita Rana .  The patient was given clear instructions to go to ER or return to medical center if symptoms don't improve, worsen or new problems develop. The patient verbalized understanding. The patient was told to call to get lab results if they haven't heard anything in the next week.     Freeman Caldron, PA-C [facility]  [place], [place]   04/07/2018, 2:17 PM

## 2018-04-07 NOTE — Progress Notes (Signed)
[name] 57 y.o. 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  04-07-18  MRI brain FU   PAIN: Sheis currently is . NEURO:Alert and oriented x 3 with fluent speech,able to complete sentences without difficulty with word finding or organization of sentences.  Denies having any problems with her memory.  visual disturbance. Fine motor movement-picking up objects with fingers,holding objects, writing, weakness of lower extremities:  Aphasia/Slurred speech: Fatigue: Weight:

## 2018-04-07 NOTE — Progress Notes (Signed)
Pt. Stated her left ear feels congested. Pt. Would like to talk to about getting measles vaccine.

## 2018-04-07 NOTE — Transfer of Care (Signed)
Immediate Anesthesia Transfer of Care Note  Patient: [name]  Procedure(s) Performed: MRI OF BRAIN WITH AND WITHOUT CONTRAST (N/A )  Patient Location: PACU  Anesthesia Type:General  Level of Consciousness: awake and patient cooperative  Airway & Oxygen Therapy: Patient Spontanous Breathing  Post-op Assessment: Report given to RN and Post -op Vital signs reviewed and stable  Post vital signs: Reviewed and stable  Last Vitals:  Vitals Value Taken Time  BP    Temp    Pulse    Resp    SpO2      Last Pain:  Vitals:   04/07/18 0648  TempSrc:   PainSc: 0-No pain      Patients Stated Pain Goal: 0 (80/03/49 1791)  Complications: No apparent anesthesia complications

## 2018-04-07 NOTE — Anesthesia Preprocedure Evaluation (Signed)
Anesthesia Evaluation  Patient identified by MRN, date of birth, ID band Patient awake    Reviewed: Allergy & Precautions, H&P , NPO status , Patient's Chart, lab work & pertinent test results  History of Anesthesia Complications (+) PONV and history of anesthetic complications  Airway Mallampati: II  TM Distance: >3 FB Neck ROM: Full    Dental no notable dental hx. (+) Teeth Intact, Dental Advisory Given   Pulmonary Current Smoker,    Pulmonary exam normal breath sounds clear to auscultation       Cardiovascular negative cardio ROS   Rhythm:Regular Rate:Normal     Neuro/Psych  Headaches, PSYCHIATRIC DISORDERS Anxiety Depression Bipolar Disorder S/p brain tumor resection  Neuromuscular disease negative psych ROS   GI/Hepatic Neg liver ROS, GERD  Medicated and Controlled,  Endo/Other  negative endocrine ROS  Renal/GU negative Renal ROS     Musculoskeletal  (+) Arthritis , Fibromyalgia -  Abdominal   Peds  Hematology negative hematology ROS (+)   Anesthesia Other Findings   Reproductive/Obstetrics                             Anesthesia Physical Anesthesia Plan  ASA: II  Anesthesia Plan: General   Post-op Pain Management:    Induction: Intravenous  PONV Risk Score and Plan: 3 and Ondansetron, Dexamethasone and Midazolam  Airway Management Planned: LMA and Oral ETT  Additional Equipment: None  Intra-op Plan:   Post-operative Plan: Extubation in OR  Informed Consent: I have reviewed the patients History and Physical, chart, labs and discussed the procedure including the risks, benefits and alternatives for the proposed anesthesia with the patient or authorized representative who has indicated his/her understanding and acceptance.   Dental advisory given  Plan Discussed with: CRNA and Surgeon  Anesthesia Plan Comments:         Anesthesia Quick Evaluation

## 2018-04-07 NOTE — Anesthesia Procedure Notes (Signed)
Procedure Name: Intubation Date/Time: 04/07/2018 8:12 AM Performed by: Lance Coon, CRNA Pre-anesthesia Checklist: Patient identified, Emergency Drugs available, Suction available, Patient being monitored and Timeout performed Patient Re-evaluated:Patient Re-evaluated prior to induction Oxygen Delivery Method: Circle system utilized Preoxygenation: Pre-oxygenation with 100% oxygen Induction Type: IV induction Ventilation: Mask ventilation without difficulty Laryngoscope Size: Miller and 3 Grade View: Grade II Tube type: Oral Tube size: 7.0 mm Number of attempts: 1 Airway Equipment and Method: Stylet Placement Confirmation: ETT inserted through vocal cords under direct vision,  positive ETCO2 and breath sounds checked- equal and bilateral Secured at: 21 cm Tube secured with: Tape Dental Injury: Teeth and Oropharynx as per pre-operative assessment

## 2018-04-08 ENCOUNTER — Encounter (HOSPITAL_COMMUNITY): Payer: Self-pay | Admitting: Radiology

## 2018-04-08 ENCOUNTER — Telehealth: Payer: Self-pay | Admitting: *Deleted

## 2018-04-08 MED FILL — Dexamethasone Sodium Phosphate Inj 10 MG/ML: INTRAMUSCULAR | Qty: 1 | Status: AC

## 2018-04-08 MED FILL — Lactated Ringer's Solution: INTRAVENOUS | Qty: 1000 | Status: AC

## 2018-04-08 MED FILL — Midazolam HCl Inj 2 MG/2ML (Base Equivalent): INTRAMUSCULAR | Qty: 2 | Status: AC

## 2018-04-08 MED FILL — Succinylcholine Chloride Sol Pref Syr 200 MG/10ML (20 MG/ML): INTRAVENOUS | Qty: 10 | Status: AC

## 2018-04-08 MED FILL — Ondansetron HCl Inj 4 MG/2ML (2 MG/ML): INTRAMUSCULAR | Qty: 2 | Status: AC

## 2018-04-08 MED FILL — Propofol IV Emul 200 MG/20ML (10 MG/ML): INTRAVENOUS | Qty: 20 | Status: AC

## 2018-04-08 MED FILL — Lidocaine HCl Local Soln Prefilled Syringe 100 MG/5ML (2%): INTRAMUSCULAR | Qty: 5 | Status: AC

## 2018-04-08 MED FILL — Phenylephrine HCl IV Soln 10 MG/ML: INTRAVENOUS | Qty: 1 | Status: AC

## 2018-04-08 MED FILL — Phenylephrine HCl IV Soln 10 MG/ML: INTRAVENOUS | Qty: 10 | Status: AC

## 2018-04-08 MED FILL — Ephedrine Sulf-NaCl Soln Pref Syr 50 MG/10ML-0.9% (5 MG/ML): INTRAVENOUS | Qty: 10 | Status: AC

## 2018-04-08 MED FILL — Fentanyl Citrate Preservative Free (PF) Inj 100 MCG/2ML: INTRAMUSCULAR | Qty: 2 | Status: AC

## 2018-04-08 NOTE — Telephone Encounter (Signed)
Second message from pt requesting MRI result. Spoke with [name]: Result will be discussed at 5/13 visit. Pt made aware, she voiced frustration. She will keep 5/13 appt.

## 2018-04-08 NOTE — Anesthesia Postprocedure Evaluation (Signed)
Anesthesia Post Note  Patient: Nikiah Goin  Procedure(s) Performed: MRI OF BRAIN WITH AND WITHOUT CONTRAST (N/A )     Patient location during evaluation: PACU Anesthesia Type: General Level of consciousness: awake and alert Pain management: pain level controlled Vital Signs Assessment: post-procedure vital signs reviewed and stable Respiratory status: spontaneous breathing, nonlabored ventilation, respiratory function stable and patient connected to nasal cannula oxygen Cardiovascular status: blood pressure returned to baseline and stable Postop Assessment: no apparent nausea or vomiting Anesthetic complications: no    Last Vitals:  Vitals:   04/07/18 1000 04/07/18 1012  BP:  119/77  Pulse:  80  Resp:  15  Temp: 36.7 C 36.7 C  SpO2:  100%    Last Pain:  Vitals:   04/07/18 1012  TempSrc: Oral  PainSc: 0-No pain                 Muaaz Brau

## 2018-04-08 NOTE — Telephone Encounter (Signed)
Message from pt requesting MRI result. Forwarded to Dr. Ida Rogue nurse voicemail.

## 2018-04-11 ENCOUNTER — Telehealth: Payer: Self-pay

## 2018-04-11 ENCOUNTER — Telehealth: Payer: Self-pay | Admitting: Radiation Oncology

## 2018-04-11 ENCOUNTER — Ambulatory Visit
Admission: RE | Admit: 2018-04-11 | Discharge: 2018-04-11 | Disposition: A | Discharge status: Home / Self Care | Payer: Medicaid Other | Source: Ambulatory Visit | Attending: Radiation Oncology | Admitting: Radiation Oncology

## 2018-04-11 NOTE — Telephone Encounter (Signed)
Her case was re-reviewed in conference and was stable. We reviewed plans to reimage with MRI in 4 months, then if stable move to q 6 months. She does not need to come in to the office today since she's doing well otherwise. She is in agreement with this plan.

## 2018-04-11 NOTE — Telephone Encounter (Signed)
Notified pt via phone regarding her recent MRI of brain per Dr [name] "please let her know - great results!" . Pt pleased.  She would like [place] to check into CT of Chest ordered in March - she hasn't got a call from scheduling yet.  I sent a inbasket message to [facility] managed care pertaining to "incomplete" as status then a nurse and / or scheduling will call pt to schedule.

## 2018-04-12 ENCOUNTER — Inpatient Hospital Stay: Payer: Medicaid Other

## 2018-04-15 ENCOUNTER — Inpatient Hospital Stay: Payer: Medicaid Other | Attending: Medical | Admitting: Internal Medicine

## 2018-04-15 ENCOUNTER — Telehealth: Payer: Self-pay | Admitting: Internal Medicine

## 2018-04-15 VITALS — BP 126/77 | HR 86 | Temp 98.0°F | Resp 18 | Ht 65.0 in | Wt 159.5 lb

## 2018-04-15 DIAGNOSIS — C50312 Malignant neoplasm of lower-inner quadrant of left female breast: Secondary | ICD-10-CM | POA: Insufficient documentation

## 2018-04-15 DIAGNOSIS — R5383 Other fatigue: Secondary | ICD-10-CM | POA: Insufficient documentation

## 2018-04-15 DIAGNOSIS — C7931 Secondary malignant neoplasm of brain: Secondary | ICD-10-CM | POA: Diagnosis present

## 2018-04-15 DIAGNOSIS — R51 Headache: Secondary | ICD-10-CM | POA: Insufficient documentation

## 2018-04-15 NOTE — Progress Notes (Signed)
[facility] [place], [place] [address] [phone]   Interval Evaluation  Date of Service: 04/15/18 Patient Name: [name] Patient MRN: [id number] Patient DOB: May 26, 1961 Provider: Ventura Sellers, MD  Identifying Statement:  [name] is a 57 y.o. 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 Vertell Limber 10/07/16: Whole brain treated to 35 Gy in 14 fractions with Dr. Lisbeth Renshaw  Interval History: [name] presents for follow up after recent MRI brain.  She does describe daily fatigue, described as moderately increased sleep volume and "lower energy at work".  This has been present over the past six months, not necessarily worsening over that time.  Her thinking is a little "slow" but doesn't affect her day to day life at all.  Headaches persist as prior, not worsened.  Overall the effects are quite mild.        Current Outpatient Medications on File Prior to Visit  Medication Sig Dispense Refill  . amoxicillin (AMOXIL) 500 MG capsule Take 2 capsules (1,000 mg total) by mouth 2 (two) times daily. 40 capsule 0  . anastrozole (ARIMIDEX) 1 MG tablet Take 1 tablet (1 mg total) daily by mouth. 30 tablet 0  . benzonatate (TESSALON) 100 MG capsule Take 2 capsules (200 mg total) by mouth 3 (three) times daily as needed for cough. (Patient not taking: Reported on 04/01/2018) 40 capsule 0  . Biotin 1000 MCG tablet Take 1,000 mcg by mouth daily.    . Cholecalciferol (VITAMIN D3) 5000 units CAPS Take 5,000 Units by mouth daily.    . cyclobenzaprine (FLEXERIL) 10 MG tablet Take 1 tablet (10 mg total) by mouth 2 (two) times daily as needed for muscle spasms. 60 tablet 3  . cycloSPORINE (RESTASIS) 0.05 %  ophthalmic emulsion Place 1 drop into both eyes 2 (two) times daily.    [name] docusate sodium (COLACE) 100 MG capsule Take 100 mg by mouth daily as needed for mild constipation.     . fluticasone (FLONASE) 50 MCG/ACT nasal spray SPRAY 2 SPRAYS INTO EACH NOSTRIL EVERY DAY 16 g 2  . gabapentin (NEURONTIN) 300 MG capsule Take 1 capsule (300 mg total) by mouth 2 (two) times daily. (Patient taking differently: Take 300-600 mg by mouth See admin instructions. Take 300 mg by mouth in the morning and take 600 mg by mouth at bedtime) 60 capsule 3  . ibuprofen (ADVIL,MOTRIN) 800 MG tablet TAKE 1 TABLET BY MOUTH THREE TIMES A DAY (Patient taking differently: Take 800 mg by mouth every 8 hours as needed for pain) 90 tablet 0  . lamoTRIgine (LAMICTAL) 25 MG tablet Take 1 tablet (25 mg total) by mouth 2 (two) times daily. (Patient taking differently: Take 75 mg by mouth every morning. ) 60 tablet 0  . loratadine (CLARITIN) 10 MG tablet Take 10 mg by mouth daily.    [name] lurasidone (LATUDA) 40 MG TABS tablet Take 1 tablet (40 mg total) by mouth daily with breakfast. 30 tablet 0  . ondansetron (ZOFRAN) 8 MG tablet Take 1 tablet (8 mg total) by mouth every 8 (eight) hours as needed for nausea or vomiting. 90 tablet 1  . pantoprazole (PROTONIX) 40 MG tablet Take 1 tablet (40 mg total) by mouth daily.  30 tablet 3  . polyethylene glycol (MIRALAX / GLYCOLAX) packet TAKE 17 G BY MOUTH DAILY AS NEEDED FOR MILD CONSTIPATION. 30 packet 0  . traZODone (DESYREL) 100 MG tablet Take 1 tablet (100 mg total) by mouth at bedtime. (Patient taking differently: Take 200 mg by mouth at bedtime. ) 30 tablet 0   No current facility-administered medications on file prior to visit.     Allergies:  Allergies  Allergen Reactions  . Demerol Itching and Nausea And Vomiting  . Erythromycin Rash   Past Medical History:  Past Medical History:  Diagnosis Date  . Alcohol abuse   . Anemia    during chemo  . Anxiety    At age 51  . Arthritis  Dx 2010  . Bipolar disorder ([place])   . Bronchitis   . Cancer ([place])    breast mets to brain  . Chronic pain   . Complication of anesthesia   . Depression   . Family history of adverse reaction to anesthesia    parents had PONV  . Fibromyalgia Dx 2005  . GERD (gastroesophageal reflux disease)   . Headache    hx  migraines  . Lymphedema of left arm   . Opiate dependence ([place])   . PONV (postoperative nausea and vomiting)   . Port-A-Cath in place   . PTSD (post-traumatic stress disorder)    Past Surgical History:  Past Surgical History:  Procedure Laterality Date  . APPLICATION OF CRANIAL NAVIGATION N/A 08/14/2016   Procedure: APPLICATION OF CRANIAL NAVIGATION;  Surgeon: Erline Levine, MD;  Location: [facility];  Service: Neurosurgery;  Laterality: N/A;  . BREAST RECONSTRUCTION Left    with silicone implant  . CRANIOTOMY N/A 08/14/2016   Procedure: CRANIOTOMY TUMOR EXCISION WITH Lucky Rathke;  Surgeon: Erline Levine, MD;  Location: [facility];  Service: Neurosurgery;  Laterality: N/A;  . FIBULA FRACTURE SURGERY Left   . MASTECTOMY Left   . RADIOLOGY WITH ANESTHESIA N/A 07/23/2016   Procedure: MRI OF BRAIN WITH AND WITHOUT;  Surgeon: Medication Radiologist, MD;  Location: [facility];  Service: Radiology;  Laterality: N/A;  . RADIOLOGY WITH ANESTHESIA N/A 09/08/2016   Procedure: MRI OF BRAIN WITH AND WITHOUT CONTRAST;  Surgeon: Medication Radiologist, MD;  Location: [facility];  Service: Radiology;  Laterality: N/A;  . RADIOLOGY WITH ANESTHESIA N/A 12/10/2016   Procedure: MRI OF BRAIN WITH AND WITHOUT;  Surgeon: Medication Radiologist, MD;  Location: [facility];  Service: Radiology;  Laterality: N/A;  . RADIOLOGY WITH ANESTHESIA N/A 03/02/2017   Procedure: MRI of BRAIN W and W/OUT CONTRAST;  Surgeon: Medication Radiologist, MD;  Location: [facility];  Service: Radiology;  Laterality: N/A;  . RADIOLOGY WITH ANESTHESIA N/A 07/29/2017   Procedure: RADIOLOGY WITH ANESTHESIA MRI OF BRAIN WITH AND WITHOUT CONTRAST;   Surgeon: Radiologist, Medication, MD;  Location: [facility];  Service: Radiology;  Laterality: N/A;  . RADIOLOGY WITH ANESTHESIA N/A 12/07/2017   Procedure: MRI WITH ANESTHESIA OF BRAIN WITH AND WITHOUT CONTRAST;  Surgeon: Radiologist, Medication, MD;  Location: [facility];  Service: Radiology;  Laterality: N/A;  . RADIOLOGY WITH ANESTHESIA N/A 04/07/2018   Procedure: MRI OF BRAIN WITH AND WITHOUT CONTRAST;  Surgeon: Radiologist, Medication, MD;  Location: [facility];  Service: Radiology;  Laterality: N/A;  . right power port placement Right    Social History:  Social History   Socioeconomic History  . Marital status: Single    Spouse name: Not on file  . Number of children: Not on file  .  Years of education: Not on file  . Highest education level: Not on file  Occupational History  . Not on file  Social Needs  . Financial resource strain: Not on file  . Food insecurity:    Worry: Not on file    Inability: Not on file  . Transportation needs:    Medical: Not on file    Non-medical: Not on file  Tobacco Use  . Smoking status: Current Every Day Smoker    Packs/day: 1.00    Types: Cigarettes  . Smokeless tobacco: Never Used  Substance and Sexual Activity  . Alcohol use: No    Comment: no ETOH since 08/22/12  . Drug use: No    Comment: states she's in recovery program for 5 years  . Sexual activity: Never    Birth control/protection: Other-see comments    Comment: ablation  Lifestyle  . Physical activity:    Days per week: Not on file    Minutes per session: Not on file  . Stress: Not on file  Relationships  . Social connections:    Talks on phone: Not on file    Gets together: Not on file    Attends religious service: Not on file    Active member of club or organization: Not on file    Attends meetings of clubs or organizations: Not on file    Relationship status: Not on file  . Intimate partner violence:    Fear of current or ex partner: Not on file    Emotionally abused: Not on file      Physically abused: Not on file    Forced sexual activity: Not on file  Other Topics Concern  . Not on file  Social History Narrative  . Not on file   Family History:  Family History  Problem Relation Age of Onset  . Diabetes Mother   . Bipolar disorder Mother   . CAD Father     Review of Systems: Constitutional: Denies fevers, chills or abnormal weight loss Eyes: Denies blurriness of vision Ears, nose, mouth, throat, and face: Denies mucositis or sore throat Respiratory: Denies cough, dyspnea or wheezes Cardiovascular: Denies palpitation, chest discomfort or lower extremity swelling Gastrointestinal:  occassional nausea GU: Denies dysuria or incontinence Skin: Denies abnormal skin rashes Neurological: Per HPI Musculoskeletal: body pain Behavioral/Psych: +anxiety  Physical Exam: Vitals:   04/15/18 1103  BP: 126/77  Pulse: 86  Resp: 18  Temp: 98 F (36.7 C)  SpO2: 100%   KPS: 90. General: Alert, cooperative, pleasant, in no acute distress Head: Normal EENT: No conjunctival injection or scleral icterus. Oral mucosa moist Lungs: Resp effort normal Cardiac: Regular rate and rhythm Abdomen: Soft, non-distended abdomen Skin: No rashes cyanosis or petechiae. Extremities: No clubbing or edema  Neurologic Exam: Mental Status: Awake, alert, attentive to examiner. Oriented to self and environment. Language is fluent with intact comprehension.  Cranial Nerves: Visual acuity is grossly normal. Visual fields are full. Extra-ocular movements intact. No ptosis. Face is symmetric, tongue midline. Motor: Tone and bulk are normal. Power is full in both arms and legs. Reflexes are symmetric, no pathologic reflexes present.  Coordination: Mild dysmetria on finger to nose bilaterally.   Sensory: Intact to light touch and temperature Gait: Normal and tandem gait is normal.   Labs: I have reviewed the data as listed    Component Value Date/Time   NA 141 03/29/2018 0802   NA  140 10/26/2017 1038   K 3.9 03/29/2018 0802  K 3.8 10/26/2017 1038   CL 105 03/29/2018 0802   CO2 27 03/29/2018 0802   CO2 24 10/26/2017 1038   GLUCOSE 99 03/29/2018 0802   GLUCOSE 158 (H) 10/26/2017 1038   BUN 11 03/29/2018 0802   BUN 12.0 10/26/2017 1038   CREATININE 0.95 03/29/2018 0802   CREATININE 0.8 10/26/2017 1038   CALCIUM 9.6 03/29/2018 0802   CALCIUM 8.7 10/26/2017 1038   PROT 6.9 03/29/2018 0802   PROT 6.1 (L) 10/26/2017 1038   ALBUMIN 3.8 03/29/2018 0802   ALBUMIN 3.7 10/26/2017 1038   AST 18 03/29/2018 0802   AST 35 (H) 10/26/2017 1038   ALT 15 03/29/2018 0802   ALT 48 10/26/2017 1038   ALKPHOS 59 03/29/2018 0802   ALKPHOS 34 (L) 10/26/2017 1038   BILITOT <0.2 (L) 03/29/2018 0802   BILITOT <0.22 10/26/2017 1038   GFRNONAA >60 03/29/2018 0802   GFRAA >60 03/29/2018 0802   Lab Results  Component Value Date   WBC 5.6 03/29/2018   NEUTROABS 4.3 03/29/2018   HGB 12.8 03/29/2018   HCT 37.4 03/29/2018   MCV 94.6 03/29/2018   PLT 176 03/29/2018    Imaging:  [facility] Clinician Interpretation: I have personally reviewed the CNS images as listed.  My interpretation, in the context of the patient's clinical presentation, is stable disease  Mr Jeri Cos Wo Contrast  Result Date: 04/07/2018 CLINICAL DATA:  Follow-up treated brain metastases. Whole-brain radiotherapy. Resected left cerebellar mass. EXAM: MRI HEAD WITHOUT AND WITH CONTRAST TECHNIQUE: Multiplanar, multiecho pulse sequences of the brain and surrounding structures were obtained without and with intravenous contrast. CONTRAST:  40m MULTIHANCE GADOBENATE DIMEGLUMINE 529 MG/ML IV SOLN COMPARISON:  12/07/2017 FINDINGS: Brain: No residual enhancement at the left cerebellar treatment site. No evidence of metastatic disease throughout the brain. Confluent FLAIR hyperintensity in the cerebral white matter attributed to treatment change. No infarct, hemorrhage, hydrocephalus, or collection. Vascular: Major flow voids and  vascular enhancements are preserved. Skull and upper cervical spine: No evidence of marrow lesion Sinuses/Orbits: Partial left mastoid opacification that is new. Negative nasopharynx. IMPRESSION: 1. Stable post treatment changes.  No evidence of disease. 2. Partial left mastoid opacification that is new from 12/07/2017. Electronically Signed   By: JMonte FantasiaM.D.   On: 04/07/2018 13:12     Assessment/Plan  1. Brain metastasis ([place] 2. Chronic nonintractable headache, unspecified headache type  Ms. HCrabtreeis clinically and radiographically stable at this time.  We recommend repeating an MRI brain in 4 months for continued surveillance.  She is due for re-staging of breast cancer later this month.  We appreciate the opportunity to participate in the care of ELetti Towell   All questions were answered. The patient knows to call the clinic with any problems, questions or concerns. No barriers to learning were detected.  The total time spent in the appointment was 25 minutes and more than 50% was on counseling and review of test results   ZVentura Sellers MD Medical Director of Neuro-Oncology [facility]05/17/19 11:03 AM

## 2018-04-15 NOTE — Telephone Encounter (Signed)
Appointments scheduled AVS/Calendar printed per 5/17 los °

## 2018-04-29 ENCOUNTER — Inpatient Hospital Stay: Payer: Medicaid Other

## 2018-04-29 ENCOUNTER — Telehealth: Payer: Self-pay | Admitting: Oncology

## 2018-04-29 VITALS — BP 127/74 | HR 99 | Temp 97.8°F | Resp 18

## 2018-04-29 DIAGNOSIS — C50312 Malignant neoplasm of lower-inner quadrant of left female breast: Secondary | ICD-10-CM

## 2018-04-29 DIAGNOSIS — C7931 Secondary malignant neoplasm of brain: Secondary | ICD-10-CM | POA: Diagnosis not present

## 2018-04-29 LAB — COMPREHENSIVE METABOLIC PANEL
ALT: 19 U/L (ref 0–55)
AST: 22 U/L (ref 5–34)
Albumin: 4.1 g/dL (ref 3.5–5.0)
Alkaline Phosphatase: 57 U/L (ref 40–150)
Anion gap: 9 (ref 3–11)
BUN: 10 mg/dL (ref 7–26)
CO2: 27 mmol/L (ref 22–29)
Calcium: 9.3 mg/dL (ref 8.4–10.4)
Chloride: 103 mmol/L (ref 98–109)
Creatinine, Ser: 1.06 mg/dL (ref 0.60–1.10)
GFR calc Af Amer: >60 mL/min (ref >60)
GFR calc non Af Amer: 57 mL/min — ABNORMAL LOW (ref >60)
Glucose, Bld: 97 mg/dL (ref 70–140)
Potassium: 3.9 mmol/L (ref 3.5–5.1)
Sodium: 139 mmol/L (ref 136–145)
Total Bilirubin: <0.2 mg/dL — ABNORMAL LOW (ref 0.2–1.2)
Total Protein: 6.8 g/dL (ref 6.4–8.3)

## 2018-04-29 LAB — CBC WITH DIFFERENTIAL/PLATELET
Basophils Absolute: 0 10*3/uL (ref 0.0–0.1)
Basophils Relative: 0 %
Eosinophils Absolute: 0.1 10*3/uL (ref 0.0–0.5)
Eosinophils Relative: 2 %
HCT: 38.4 % (ref 34.8–46.6)
Hemoglobin: 12.8 g/dL (ref 11.6–15.9)
Lymphocytes Relative: 27 %
Lymphs Abs: 1.2 10*3/uL (ref 0.9–3.3)
MCH: 31.8 pg (ref 25.1–34.0)
MCHC: 33.3 g/dL (ref 31.5–36.0)
MCV: 95.5 fL (ref 79.5–101.0)
Monocytes Absolute: 0.3 10*3/uL (ref 0.1–0.9)
Monocytes Relative: 8 %
Neutro Abs: 2.9 10*3/uL (ref 1.5–6.5)
Neutrophils Relative %: 63 %
Platelets: 159 10*3/uL (ref 145–400)
RBC: 4.02 MIL/uL (ref 3.70–5.45)
RDW: 12.6 % (ref 11.2–14.5)
WBC: 4.5 10*3/uL (ref 3.9–10.3)

## 2018-04-29 MED ORDER — DENOSUMAB 120 MG/1.7ML ~~LOC~~ SOLN
120.0000 mg | Freq: Once | SUBCUTANEOUS | Status: AC
  Administered 2018-04-29: 120 mg via SUBCUTANEOUS

## 2018-04-29 MED ORDER — SODIUM CHLORIDE 0.9 % IV SOLN
Freq: Once | INTRAVENOUS | Status: AC
  Administered 2018-04-29: 14:00:00 via INTRAVENOUS

## 2018-04-29 MED ORDER — SODIUM CHLORIDE 0.9% FLUSH
10.0000 mL | INTRAVENOUS | Status: DC | PRN
  Administered 2018-04-29: 10 mL
  Filled 2018-04-29: qty 10

## 2018-04-29 MED ORDER — DIPHENHYDRAMINE HCL 25 MG PO CAPS: 25.0000 mg | ORAL_CAPSULE | Freq: Once | ORAL | Status: DC

## 2018-04-29 MED ORDER — HEPARIN SOD (PORK) LOCK FLUSH 100 UNIT/ML IV SOLN
500.0000 [IU] | Freq: Once | INTRAVENOUS | Status: AC | PRN
  Administered 2018-04-29: 500 [IU]
  Filled 2018-04-29: qty 5

## 2018-04-29 MED ORDER — ACETAMINOPHEN 325 MG PO TABS: 650.0000 mg | ORAL_TABLET | Freq: Once | ORAL | Status: DC

## 2018-04-29 MED ORDER — DENOSUMAB 120 MG/1.7ML ~~LOC~~ SOLN
SUBCUTANEOUS | Status: AC
  Filled 2018-04-29: qty 1.7

## 2018-04-29 MED ORDER — SODIUM CHLORIDE 0.9 % IV SOLN
450.0000 mg | Freq: Once | INTRAVENOUS | Status: AC
  Administered 2018-04-29: 450 mg via INTRAVENOUS
  Filled 2018-04-29: qty 21.43

## 2018-04-29 NOTE — Telephone Encounter (Signed)
Scheduled appt per 5/31 sch message - pt aware of appt date and time.

## 2018-04-29 NOTE — Patient Instructions (Signed)
[facility] Discharge Instructions for Patients Receiving Chemotherapy  Today you received the following chemotherapy agents herceptin   To help prevent nausea and vomiting after your treatment, we encourage you to take your nausea medication as directed   If you develop nausea and vomiting that is not controlled by your nausea medication, call the clinic.   BELOW ARE SYMPTOMS THAT SHOULD BE REPORTED IMMEDIATELY:  *FEVER GREATER THAN 100.5 F  *CHILLS WITH OR WITHOUT FEVER  NAUSEA AND VOMITING THAT IS NOT CONTROLLED WITH YOUR NAUSEA MEDICATION  *UNUSUAL SHORTNESS OF BREATH  *UNUSUAL BRUISING OR BLEEDING  TENDERNESS IN MOUTH AND THROAT WITH OR WITHOUT PRESENCE OF ULCERS  *URINARY PROBLEMS  *BOWEL PROBLEMS  UNUSUAL RASH Items with * indicate a potential emergency and should be followed up as soon as possible.  Feel free to call the clinic you have any questions or concerns. The clinic phone number is [phone].  

## 2018-04-30 LAB — CANCER ANTIGEN 27.29: CA 27.29: 14.6 U/mL (ref 0.0–38.6)

## 2018-05-04 ENCOUNTER — Encounter (HOSPITAL_COMMUNITY): Payer: Self-pay

## 2018-05-04 ENCOUNTER — Telehealth: Payer: Self-pay | Admitting: *Deleted

## 2018-05-04 ENCOUNTER — Ambulatory Visit (HOSPITAL_COMMUNITY)
Admission: RE | Admit: 2018-05-04 | Discharge: 2018-05-04 | Disposition: A | Discharge status: Home / Self Care | Payer: Medicaid Other | Source: Ambulatory Visit | Attending: Oncology | Admitting: Oncology

## 2018-05-04 DIAGNOSIS — C50312 Malignant neoplasm of lower-inner quadrant of left female breast: Secondary | ICD-10-CM | POA: Insufficient documentation

## 2018-05-04 DIAGNOSIS — G9340 Encephalopathy, unspecified: Secondary | ICD-10-CM

## 2018-05-04 DIAGNOSIS — C50919 Malignant neoplasm of unspecified site of unspecified female breast: Secondary | ICD-10-CM

## 2018-05-04 DIAGNOSIS — R590 Localized enlarged lymph nodes: Secondary | ICD-10-CM | POA: Insufficient documentation

## 2018-05-04 DIAGNOSIS — C7951 Secondary malignant neoplasm of bone: Secondary | ICD-10-CM | POA: Insufficient documentation

## 2018-05-04 DIAGNOSIS — C7931 Secondary malignant neoplasm of brain: Secondary | ICD-10-CM | POA: Diagnosis not present

## 2018-05-04 MED ORDER — IOHEXOL 300 MG/ML  SOLN
75.0000 mL | Freq: Once | INTRAMUSCULAR | Status: AC | PRN
  Administered 2018-05-04: 75 mL via INTRAVENOUS

## 2018-05-04 NOTE — Telephone Encounter (Signed)
Pt called for results of CT chest.  Informed pt of known noted lymph nodes - which MD can show/talk about further at visit. No new areas of concern per reading.

## 2018-05-07 ENCOUNTER — Other Ambulatory Visit: Payer: Self-pay | Admitting: Oncology

## 2018-05-09 NOTE — Progress Notes (Signed)
[facility]  [phone]) [phone] [phone]) [phone]     ID: [name] DOB: 1961/10/10  MR#: [id number]  ZDG#:[id number]  Patient Care Team: Charlott Rakes, MD as PCP - General (Family Medicine) Kyung Rudd, MD as Consulting Physician (Radiation Oncology) Erline Levine, MD as Consulting Physician (Neurosurgery) Corena Pilgrim, MD as Consulting Physician (Psychiatry) Mickeal Skinner Acey Lav, MD as Consulting Physician (Psychiatry) Nehemiah Settle, MD as Referring Physician (Plastic Surgery) Irene Limbo, MD as Consulting Physician (Plastic Surgery)  CHIEF COMPLAINT: Metastatic triple positive breast cancer  CURRENT TREATMENT: Trastuzumab, denosumab, anastrozole   INTERVAL HISTORY: [name] returns today for a follow-up and treatment of her metastatic triple positive breast cancer.   The patient continues on anastrozole, which she tolerates well. She endorses vaginal dryness that is noticed when she is trying to be sexually active, which is not often. She reports a low libido. She denies hot flashes.   She also receives trastuzumab every 21 days.  Lastly, she receives [employer], monthly.  Her most recent echocardiogram on 02/14/2018 shows an ejection fraction in the 60-65% range.  On 05/04/2018 she underwent a CT chest with contrast that showed mild right mediastinal lymphadenopathy, increased, worrisome for progression of nodal metastatic disease. PET-CT could be considered for further evaluation. No additional findings of new or progressive metastatic disease in the chest. Sclerotic osseous metastases in the thoracolumbar spine are stable.   REVIEW OF SYSTEMS: [name] is doing well. She has been going to a tanning bed because she will be going to [place] to visit her family. She leaves 05/13/2018 and will be back on 05/23/2018. She has been staying busy working, and taking care of her lawn. Her stamina is not where it used to be. She reports weight gain  and adds she has not been exercising as much as she should be. The patient denies unusual headaches, visual changes, nausea, vomiting, or dizziness. There has been no unusual cough, phlegm production, or pleurisy. This been no change in bowel or bladder habits. The patient denies unexplained weight loss, bleeding, rash, or fever. A detailed review of systems was otherwise noncontributory.     BREAST CANCER HISTORY: From the original intake note:  [name] was aware of a "lemon sized lump in" her left axilla for about a year before bringing it to medical attention. By then she had developed left breast and left axillary swelling (June 2016). She presented to the local emergency room and had a chest CT scan 06/06/2015 which showed a nodule in the left breast measuring 0.9 cm and questionable left axillary adenopathy. She then proceeded to bilateral diagnostic mammography and left breast ultrasonography 06/19/2015. There were no prior films for comparison (last mammography 12 years prior).. The breast density was category C. Mammography showed in the left breast upper inner quadrant a 7 cm area including a small mass and significant pleomorphic calcifications. Ultrasonography defined the mass as measuring 1.2 cm. The left axilla appeared unremarkable. There was significant skin edema.  Biopsy of the left breast mass 06/19/2015 showed (SP [id number]) an invasive ductal carcinoma, grade 2, estrogen receptor 83% positive, progesterone receptor 26% positive, and HER-2 amplified by immunohistochemstry with a 3+ reading. The patient had biopsies of a separate area in the left breast August of the same year and this showed atypical ductal hyperplasia. (SP [id number]).  Accordingly after appropriate discussion on 08/21/2015 the patient proceeded to left mastectomy with left axillary sentinel lymph node sampling, which, since the lymph nodes were positive, extended to the procedure  to left axillary lymph node dissection. The  pathology (SP [id number]) showed an invasive ductal carcinoma, grade 3, measuring in excess of 9 cm. There were also skin satellites, not contiguous with the invasive carcinoma. Margins were clear and ample. There was evidence of lymphovascular invasion. A total of 15 lymph nodes were removed, including 5 sentinel lymph nodes, all of which were positive, so that the final total was 14 out of 15 lymph nodes involved by tumor. There was evidence of extranodal extension. The final pathology was pT4b pN2a, stage IIIB  CA-27-29 and CEA 09/19/2015 were non-informative October 2016.  Unfortunately CT scans of the chest abdomen and pelvis 09/16/2015 showed bony metastases to the right scapula, left iliac crest, and also L4 and T-spine. There were questionable liver cysts which on repeat CT scan 03/02/2016 appear to be a little bit more well-defined, possibly a little larger. There were also some possible right upper lobe lung lesions.  Adjuvant treatment consisted of docetaxel, trastuzumab and pertuzumab, with the final (6th) docetaxel dose given 02/11/2016. She continues on trastuzumab and pertuzumab, with the 11th cycle given 05/05/2016. Echocardiogram 02/26/2016 showed an ejection fraction of 55%. She receives denosumab/Xgeva every 4 weeks.. She also receives radiation, started 06/09/201, to be completed 06/26/2016.  Her subsequent history is as detailed below   PAST MEDICAL HISTORY: Past Medical History:  Diagnosis Date  . Alcohol abuse   . Anemia    during chemo  . Anxiety    At age 1  . Arthritis Dx 2010  . Bipolar disorder ([place])   . Bronchitis   . Cancer ([place])    breast mets to brain  . Chronic pain   . Complication of anesthesia   . Depression   . Family history of adverse reaction to anesthesia    parents had PONV  . Fibromyalgia Dx 2005  . GERD (gastroesophageal reflux disease)   . Headache    hx  migraines  . Lymphedema of left arm   . Opiate dependence ([place])   . PONV  (postoperative nausea and vomiting)   . Port-A-Cath in place   . PTSD (post-traumatic stress disorder)      PAST SURGICAL HISTORY: Past Surgical History:  Procedure Laterality Date  . APPLICATION OF CRANIAL NAVIGATION N/A 08/14/2016   Procedure: APPLICATION OF CRANIAL NAVIGATION;  Surgeon: Erline Levine, MD;  Location: [facility];  Service: Neurosurgery;  Laterality: N/A;  . BREAST RECONSTRUCTION Left    with silicone implant  . CRANIOTOMY N/A 08/14/2016   Procedure: CRANIOTOMY TUMOR EXCISION WITH Lucky Rathke;  Surgeon: Erline Levine, MD;  Location: [facility];  Service: Neurosurgery;  Laterality: N/A;  . FIBULA FRACTURE SURGERY Left   . MASTECTOMY Left   . RADIOLOGY WITH ANESTHESIA N/A 07/23/2016   Procedure: MRI OF BRAIN WITH AND WITHOUT;  Surgeon: Medication Radiologist, MD;  Location: [facility];  Service: Radiology;  Laterality: N/A;  . RADIOLOGY WITH ANESTHESIA N/A 09/08/2016   Procedure: MRI OF BRAIN WITH AND WITHOUT CONTRAST;  Surgeon: Medication Radiologist, MD;  Location: [facility];  Service: Radiology;  Laterality: N/A;  . RADIOLOGY WITH ANESTHESIA N/A 12/10/2016   Procedure: MRI OF BRAIN WITH AND WITHOUT;  Surgeon: Medication Radiologist, MD;  Location: [facility];  Service: Radiology;  Laterality: N/A;  . RADIOLOGY WITH ANESTHESIA N/A 03/02/2017   Procedure: MRI of BRAIN W and W/OUT CONTRAST;  Surgeon: Medication Radiologist, MD;  Location: [facility];  Service: Radiology;  Laterality: N/A;  . RADIOLOGY WITH ANESTHESIA N/A 07/29/2017   Procedure:  RADIOLOGY WITH ANESTHESIA MRI OF BRAIN WITH AND WITHOUT CONTRAST;  Surgeon: Radiologist, Medication, MD;  Location: [facility];  Service: Radiology;  Laterality: N/A;  . RADIOLOGY WITH ANESTHESIA N/A 12/07/2017   Procedure: MRI WITH ANESTHESIA OF BRAIN WITH AND WITHOUT CONTRAST;  Surgeon: Radiologist, Medication, MD;  Location: [facility];  Service: Radiology;  Laterality: N/A;  . RADIOLOGY WITH ANESTHESIA N/A 04/07/2018   Procedure: MRI OF BRAIN WITH AND WITHOUT  CONTRAST;  Surgeon: Radiologist, Medication, MD;  Location: [facility];  Service: Radiology;  Laterality: N/A;  . right power port placement Right      FAMILY HISTORY Family History  Problem Relation Age of Onset  . Diabetes Mother   . Bipolar disorder Mother   . CAD Father    The patient's father still living, age 50, in [place]. He had prostate cancer at some point in the past. The patient's mother died at age 66 from complications of diabetes. The patient had no brothers, 2 sisters. A paternal grandmother had lung cancer in the setting of tobacco abuse. There is no other history of cancer in the family to her knowledge  GYNECOLOGIC HISTORY:  No LMP recorded. Patient has had an ablation. Menarche approximately age 19. First live birth in 89. The patient is GX P2. She underwent endometrial ablation in 2016.  SOCIAL HISTORY:  The patient is originally from [place]. She has lived in [place] before but more recently was in [place]. She is back here because she could not afford her rent in [place]. She is living here and a temporary situation. She is divorced. HER-2 children are [name] who lives in [name] and works as a Development worker, community, and [name] who also lives in [place] and works as a Catering manager. The patient has a grandchild, Arelia Longest, 85 years old as of July 2017, living in [place] with his mother. The patient has not established herself with a local church yet.    ADVANCED DIRECTIVES: Not in place; at the 06/03/2016 visit the patient was given the appropriate forms to complete and notarize at her discretion   HEALTH MAINTENANCE: Social History   Tobacco Use  . Smoking status: Current Every Day Smoker    Packs/day: 1.00    Types: Cigarettes  . Smokeless tobacco: Never Used  Substance Use Topics  . Alcohol use: No    Comment: no ETOH since 08/22/12  . Drug use: No    Comment: states she's in recovery program for 5 years     Colonoscopy:  PAP:  Bone  density:   Allergies  Allergen Reactions  . Demerol Itching and Nausea And Vomiting  . Erythromycin Rash    Current Outpatient Medications on File Prior to Visit  Medication Sig Dispense Refill  . amoxicillin (AMOXIL) 500 MG capsule Take 2 capsules (1,000 mg total) by mouth 2 (two) times daily. 40 capsule 0  . anastrozole (ARIMIDEX) 1 MG tablet Take 1 tablet (1 mg total) daily by mouth. 30 tablet 0  . anastrozole (ARIMIDEX) 1 MG tablet TAKE 1 TABLET BY MOUTH EVERY DAY 90 tablet 1  . benzonatate (TESSALON) 100 MG capsule Take 2 capsules (200 mg total) by mouth 3 (three) times daily as needed for cough. (Patient not taking: Reported on 04/01/2018) 40 capsule 0  . Biotin 1000 MCG tablet Take 1,000 mcg by mouth daily.    . Cholecalciferol (VITAMIN D3) 5000 units CAPS Take 5,000 Units by mouth daily.    . cyclobenzaprine (FLEXERIL) 10 MG tablet Take 1 tablet (10 mg total)  by mouth 2 (two) times daily as needed for muscle spasms. 60 tablet 3  . cycloSPORINE (RESTASIS) 0.05 % ophthalmic emulsion Place 1 drop into both eyes 2 (two) times daily.    [name] docusate sodium (COLACE) 100 MG capsule Take 100 mg by mouth daily as needed for mild constipation.     . fluticasone (FLONASE) 50 MCG/ACT nasal spray SPRAY 2 SPRAYS INTO EACH NOSTRIL EVERY DAY 16 g 2  . gabapentin (NEURONTIN) 300 MG capsule Take 1 capsule (300 mg total) by mouth 2 (two) times daily. (Patient taking differently: Take 300-600 mg by mouth See admin instructions. Take 300 mg by mouth in the morning and take 600 mg by mouth at bedtime) 60 capsule 3  . ibuprofen (ADVIL,MOTRIN) 800 MG tablet TAKE 1 TABLET BY MOUTH THREE TIMES A DAY (Patient taking differently: Take 800 mg by mouth every 8 hours as needed for pain) 90 tablet 0  . lamoTRIgine (LAMICTAL) 25 MG tablet Take 1 tablet (25 mg total) by mouth 2 (two) times daily. (Patient taking differently: Take 75 mg by mouth every morning. ) 60 tablet 0  . loratadine (CLARITIN) 10 MG tablet Take 10 mg  by mouth daily.    [name] lurasidone (LATUDA) 40 MG TABS tablet Take 1 tablet (40 mg total) by mouth daily with breakfast. 30 tablet 0  . ondansetron (ZOFRAN) 8 MG tablet Take 1 tablet (8 mg total) by mouth every 8 (eight) hours as needed for nausea or vomiting. 90 tablet 1  . pantoprazole (PROTONIX) 40 MG tablet Take 1 tablet (40 mg total) by mouth daily. 30 tablet 3  . polyethylene glycol (MIRALAX / GLYCOLAX) packet TAKE 17 G BY MOUTH DAILY AS NEEDED FOR MILD CONSTIPATION. 30 packet 0  . traZODone (DESYREL) 100 MG tablet Take 1 tablet (100 mg total) by mouth at bedtime. (Patient taking differently: Take 200 mg by mouth at bedtime. ) 30 tablet 0   No current facility-administered medications on file prior to visit.      OBJECTIVE: Middle-aged white woman in no acute distress  Vitals:   05/10/18 0954  BP: 114/85  Pulse: (!) 106  Resp: 18  Temp: 98.2 F (36.8 C)  TempSrc: Oral  SpO2: 98%  Weight: 160 lb 12.8 oz (72.9 kg)  Height: _0  (1.651 m)  Body mass index is 26.76 kg/m.  ECOG FS: 1 - Symptomatic but completely ambulatory  Sclerae unicteric, EOMs intact Oropharynx clear and moist No cervical or supraclavicular adenopathy Lungs no rales or rhonchi Heart regular rate and rhythm Abd soft, nontender, positive bowel sounds MSK no focal spinal tenderness, no upper extremity lymphedema Neuro: nonfocal, well oriented, appropriate affect Breasts: The right breast is unremarkable.  The left breast has undergone mastectomy and expander is in place.  There is no evidence of local recurrence.  Both axillae are benign Skin: Status post tanning bed  LAB RESULTS: No results found for: LABCA2  CBC    Component Value Date/Time   WBC 4.5 04/29/2018 1243   RBC 4.02 04/29/2018 1243   HGB 12.8 04/29/2018 1243   HGB 12.1 10/26/2017 1038   HCT 38.4 04/29/2018 1243   HCT 35.7 10/26/2017 1038   PLT 159 04/29/2018 1243   PLT 167 10/26/2017 1038   MCV 95.5 04/29/2018 1243   MCV 98.4  10/26/2017 1038   MCH 31.8 04/29/2018 1243   MCHC 33.3 04/29/2018 1243   RDW 12.6 04/29/2018 1243   RDW 13.3 10/26/2017 1038   LYMPHSABS 1.2 04/29/2018 1243  LYMPHSABS 0.5 (L) 10/26/2017 1038   MONOABS 0.3 04/29/2018 1243   MONOABS 0.1 10/26/2017 1038   EOSABS 0.1 04/29/2018 1243   EOSABS 0.0 10/26/2017 1038   BASOSABS 0.0 04/29/2018 1243   BASOSABS 0.0 10/26/2017 1038   CMP Latest Ref Rng & Units 04/29/2018 03/29/2018 02/15/2018  Glucose 70 - 140 mg/dL 97 99 90  BUN 7 - 26 mg/dL _0 Creatinine 0.60 - 1.10 mg/dL 1.06 0.95 0.94  Sodium 136 - 145 mmol/L 139 141 139  Potassium 3.5 - 5.1 mmol/L 3.9 3.9 4.3  Chloride 98 - 109 mmol/L 103 105 104  CO2 22 - 29 mmol/L _1 Calcium 8.4 - 10.4 mg/dL 9.3 9.6 9.4  Total Protein 6.4 - 8.3 g/dL 6.8 6.9 6.5  Total Bilirubin 0.2 - 1.2 mg/dL <0.2(L) <0.2(L) 0.2  Alkaline Phos 40 - 150 U/L 57 59 50  AST 5 - 34 U/L _2 ALT 0 - 55 U/L _3 STUDIES: Ct Chest W Contrast  Result Date: 05/04/2018 CLINICAL DATA:  Stage IV metastatic left breast cancer. Restaging. Ongoing medical therapy. EXAM: CT CHEST WITH CONTRAST TECHNIQUE: Multidetector CT imaging of the chest was performed during intravenous contrast administration. CONTRAST:  64m OMNIPAQUE IOHEXOL 300 MG/ML  SOLN COMPARISON:  11/10/2017 chest CT. FINDINGS: Cardiovascular: Normal heart size. No significant pericardial effusion/thickening. Right subclavian MediPort terminates in the lower third of the SVC. Great vessels are normal in course and caliber. No central pulmonary emboli. Mediastinum/Nodes: Stable subcentimeter bilateral hypodense thyroid lobe nodules, several partially calcified on the left. Unremarkable esophagus. No axillary adenopathy. Enlarged 1.4 cm right lower paratracheal node (series 2/image 50), increased from 0.9 cm. New top-normal right prevascular mediastinal nodes measuring up to 0.7 cm (series 2/image 49). No pathologically enlarged hilar nodes.  Lungs/Pleura: No pneumothorax. No pleural effusion. No acute consolidative airspace disease, lung masses or significant pulmonary nodules. Minimal subpleural reticulation in the anterior left upper lobe is stable and compatible with minimal radiation fibrosis. Mild hypoventilatory changes in the dependent lungs. Previously described 7 mm posterior right upper lobe nodular opacity is absent on today's scan and probably represented a small focus of atelectasis. Upper abdomen: Several subcentimeter hypodense lesions scattered throughout the liver are too small to characterize and are not appreciably changed. Musculoskeletal: Sclerotic T6, T9 and L1 vertebral osseous lesions are stable. No new focal osseous lesions. Left mastectomy. Intact appearing left breast prosthesis. IMPRESSION: 1. Mild right mediastinal lymphadenopathy, increased, worrisome for progression of nodal metastatic disease. PET-CT could be considered for further evaluation. 2. No additional findings of new or progressive metastatic disease in the chest. Sclerotic osseous metastases in the thoracolumbar spine are stable. Electronically Signed   By: JIlona SorrelM.D.   On: 05/04/2018 11:05    ELIGIBLE FOR AVAILABLE RESEARCH PROTOCOL: no  ASSESSMENT: 57y.o. 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  (2) status post left mastectomy and axillary lymph node dissection  with immediate expander placement 07/18/2015 for an mpT4 pN2,stage IIIB invasive ductal carcinoma, grade 3, with negative margins.  (a) definitive implant exchange to be scheduled in December   METASTATIC DISEASE: October 2016  (3) CT scan of the chest abdomen and pelvis  09/16/2015 shows metastatic lesions in the right scapula, left iliac crest, L4, and T spine.  There were questionable  liver cysts, with repeat CT scan 03/02/2016 showing possible right upper lobe lung lesions and possibly increased liver lesions  (a) CT scan of the chest 06/17/2016 shows no active disease in the lungs or liver  (b) Bone scan July 2017 showed no evidence of bony metastatic disease   (c) head CT 07/08/2016 showed a cerebellar lesion, confirmed by MRI 07/23/2016, status post craniotomy 08/14/2016, confirming a metastatic deposit which was estrogen and progesterone receptor negative, HER-2 amplified with a signals ratio of 7.16, number per cell 13.25  (d) CA 27-29 is not informative  (4) received docetaxel every 3 weeks 6 together with trastuzumab and pertuzumab, last docetaxel dose 02/11/2016  (5) adjuvant radiation7/03/2016 to 06/26/2016 at [facility]: 1. The Left chest wall was treated to 23.4 Gy in 13 fractions at 1.8 Gy per fraction. 2. The Left chest wall was boosted to 10 Gy in 5 fractions at 2 Gy per fraction. 3. The Left Sclav/PAB was treated to 23.4 Gy in 13 fractions at 1.8 Gy per fraction.  [Note: Including the patient's treatment in [place] (received 15 fractions per Dr. Maryan Rued near [place], [place]), the patient received 50.4 Gy to the left chest wall and supraclavicular region. ]  (6) started trastuzumab and pertuzumab October 2016, continuing every 3 weeks,  (a) echocardiogram 02/26/2016 showed a well preserved ejection fraction  (b) echocardiogram 07/01/2016 shows an ejection fraction in the 60-65%   (c) pertuzumab discontinued 10/2016 with uncontrolled diarrhea  (d) echocardiogram 11/11/2016 showed an ejection fraction in the 60-65%  (e) echocardiogram 03/03/2017 shows an ejection fraction of 60-65%  (f) echocardiogram on 05/19/2017 shows an ejection fraction of 55-60%  (g) echocardiogram 09/24/2017 shows the ejection fraction in the 60-65% range.  (h) echocardiogram 02/14/2018 shows an ejection fraction in the 60-65% range.    (7) started denosumab/Xgeva October 2017,  repeated every 4 weeks  (8) started anastrozole October 2017   (a) bone scan 11/10/2016 shows no active disease  (b) chest CT scan 11/10/2016 stable, with no evidence of active disease  (c) chest CT and bone scan 07/02/2017 show no evidence of active disease  (d) CT scan of the chest with contrast 11/10/2017 shows some left axillary edema, but no evidence of thrombosis or adenopathy in that area, 0.9 cm precarinal lymph and 0.7 cm right upper lobe nodule node; bone lesions stable  (e) CT of the chest 05/04/2018 shows a 1.4 cm right lower paratracheal node which is slightly increased and a new right prevascular mediastinal node measuring 0.7 cm.  Bone lesions are stable.   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  (a) brain irradiation10/19/17-11/08/17: Whole brain/ 35 Gy in 14 fractions   (b) repeat brain MRI obtained 12/10/2016 shows no active disease in the brain  (c) repeat brain MRI 03/02/2017 shows no evidence of residual or recurrent disease  (d) repeat brain MRI 07/29/2017 shows no evidence of residual or recurrent disease  (e) repeat brain MRI 12/07/2017 shows no evidence of disease recurrence.  There is progressive white matter change secondary to prior treatment.  (f) repeat brain MRI 04/07/2018 showed no evidence of disease   PLAN:  Maryuri is now a little over 2-1/2 years out from definitive diagnosis of metastatic breast cancer with brain involvement, with no definitive evidence of active disease.  This is very favorable.  We discussed her current CT results in detail.  The biggest change is a single lymph node that has  grown by 5 mm.  She is having some sinus symptoms and a little bit of a bronchitic cough and of course she does continue to smoke.  This certainly could account for this minor change.  This does need follow-up though and that is why we are repeating a CT scan of the chest before she returns to see me 07/19/2018.  He is planning a trip to [place] with 1 of her children and her father to visit her sister and nephews.  She will be back 06 24 and therefore we are changing her next treatment date to 06/ 26.  She is she is scheduled for colonoscopy early July  She knows to call for any other issues that may develop before her next visit.    Lillianne Eick, Virgie Dad, MD  05/10/18 10:21 AM Medical Oncology and Hematology [facility] [address] [place], [place] [address] Tel. [phone]    Fax. [phone]  I, Margit Banda am acting as a scribe for Chauncey Cruel, MD.   I, Lurline Del MD, have reviewed the above documentation for accuracy and completeness, and I agree with the above.

## 2018-05-10 ENCOUNTER — Inpatient Hospital Stay: Payer: Medicaid Other | Attending: Medical | Admitting: Oncology

## 2018-05-10 ENCOUNTER — Other Ambulatory Visit: Payer: Self-pay

## 2018-05-10 ENCOUNTER — Ambulatory Visit: Payer: Self-pay

## 2018-05-10 ENCOUNTER — Telehealth: Payer: Self-pay | Admitting: Oncology

## 2018-05-10 VITALS — BP 114/85 | HR 106 | Temp 98.2°F | Resp 18 | Ht 65.0 in | Wt 160.8 lb

## 2018-05-10 DIAGNOSIS — Z9012 Acquired absence of left breast and nipple: Secondary | ICD-10-CM | POA: Insufficient documentation

## 2018-05-10 DIAGNOSIS — Z5112 Encounter for antineoplastic immunotherapy: Secondary | ICD-10-CM | POA: Insufficient documentation

## 2018-05-10 DIAGNOSIS — C7951 Secondary malignant neoplasm of bone: Secondary | ICD-10-CM | POA: Diagnosis not present

## 2018-05-10 DIAGNOSIS — F319 Bipolar disorder, unspecified: Secondary | ICD-10-CM | POA: Diagnosis not present

## 2018-05-10 DIAGNOSIS — C7931 Secondary malignant neoplasm of brain: Secondary | ICD-10-CM | POA: Diagnosis not present

## 2018-05-10 DIAGNOSIS — R05 Cough: Secondary | ICD-10-CM | POA: Insufficient documentation

## 2018-05-10 DIAGNOSIS — C50919 Malignant neoplasm of unspecified site of unspecified female breast: Secondary | ICD-10-CM

## 2018-05-10 DIAGNOSIS — Z923 Personal history of irradiation: Secondary | ICD-10-CM | POA: Diagnosis not present

## 2018-05-10 DIAGNOSIS — Z170 Estrogen receptor positive status [ER+]: Secondary | ICD-10-CM | POA: Insufficient documentation

## 2018-05-10 DIAGNOSIS — C50312 Malignant neoplasm of lower-inner quadrant of left female breast: Secondary | ICD-10-CM | POA: Insufficient documentation

## 2018-05-10 NOTE — Telephone Encounter (Signed)
Gave avs and calendar ° °

## 2018-05-13 ENCOUNTER — Other Ambulatory Visit: Payer: Self-pay | Admitting: *Deleted

## 2018-05-13 NOTE — Telephone Encounter (Signed)
Pt left 4 VM on 05/12/2018 stating " when I saw Dr [name] Tuesday he was going to refill my lorazepam - I am flying out tomorrow to [place] ".  Message not retrieved off of VM per phone that had calls transferred to another line. ( note messages forwarded can bypass transfer request ).  This RN reviewed chart and noted pt does not have lorazepam on medication list. Per MD dictation not noted as part of discussion.  Pt had previously been on this medication but was discontinued over a year ago due to noted mental status changes and concern for drug interactions with known bipolar diagnosis.  Above will be reviewed with MD upon his return to office on 05/16/2018

## 2018-05-17 ENCOUNTER — Other Ambulatory Visit: Payer: Self-pay

## 2018-05-17 MED ORDER — ONDANSETRON HCL 8 MG PO TABS: 8.0000 mg | ORAL_TABLET | Freq: Three times a day (TID) | ORAL | 1 refills | Status: DC | PRN

## 2018-05-17 MED ORDER — HYDROXYZINE HCL 25 MG PO TABS: 25.0000 mg | ORAL_TABLET | Freq: Three times a day (TID) | ORAL | 0 refills | Status: DC | PRN

## 2018-05-21 ENCOUNTER — Other Ambulatory Visit: Payer: Self-pay | Admitting: Oncology

## 2018-05-23 ENCOUNTER — Ambulatory Visit: Payer: Self-pay

## 2018-05-23 ENCOUNTER — Other Ambulatory Visit: Payer: Self-pay

## 2018-05-24 ENCOUNTER — Telehealth: Payer: Self-pay | Admitting: *Deleted

## 2018-05-24 NOTE — Telephone Encounter (Signed)
Pt currently undergoing treatment for breast cancer with mets. Was referred for screening colonoscopy by PCP. Discussed with Dr. Ardis Hughs and patient needs an OV with any provider to discuss appropriateness of timing for screening colonoscopy.   Pt was scheduled for PV today. No show. Called pt, no answer, left message that colonoscopy has been cancelled and pt should call in to schedule and OV with any provider.

## 2018-05-25 ENCOUNTER — Inpatient Hospital Stay: Payer: Medicaid Other

## 2018-05-25 VITALS — HR 87 | Temp 98.1°F | Resp 16 | Wt 163.8 lb

## 2018-05-25 DIAGNOSIS — C50312 Malignant neoplasm of lower-inner quadrant of left female breast: Secondary | ICD-10-CM

## 2018-05-25 DIAGNOSIS — Z5112 Encounter for antineoplastic immunotherapy: Secondary | ICD-10-CM | POA: Diagnosis not present

## 2018-05-25 LAB — CBC WITH DIFFERENTIAL/PLATELET
Basophils Absolute: 0 10*3/uL (ref 0.0–0.1)
Basophils Relative: 1 %
Eosinophils Absolute: 0.1 10*3/uL (ref 0.0–0.5)
Eosinophils Relative: 2 %
HCT: 36.3 % (ref 34.8–46.6)
Hemoglobin: 12.4 g/dL (ref 11.6–15.9)
Lymphocytes Relative: 26 %
Lymphs Abs: 0.9 10*3/uL (ref 0.9–3.3)
MCH: 31.8 pg (ref 25.1–34.0)
MCHC: 34.3 g/dL (ref 31.5–36.0)
MCV: 92.7 fL (ref 79.5–101.0)
Monocytes Absolute: 0.3 10*3/uL (ref 0.1–0.9)
Monocytes Relative: 9 %
Neutro Abs: 2.2 10*3/uL (ref 1.5–6.5)
Neutrophils Relative %: 62 %
Platelets: 172 10*3/uL (ref 145–400)
RBC: 3.91 MIL/uL (ref 3.70–5.45)
RDW: 13.2 % (ref 11.2–14.5)
WBC: 3.5 10*3/uL — ABNORMAL LOW (ref 3.9–10.3)

## 2018-05-25 LAB — COMPREHENSIVE METABOLIC PANEL
ALT: 17 U/L (ref 0–44)
AST: 20 U/L (ref 15–41)
Albumin: 3.8 g/dL (ref 3.5–5.0)
Alkaline Phosphatase: 54 U/L (ref 38–126)
Anion gap: 10 (ref 5–15)
BUN: 13 mg/dL (ref 6–20)
CO2: 26 mmol/L (ref 22–32)
Calcium: 9.6 mg/dL (ref 8.9–10.3)
Chloride: 104 mmol/L (ref 98–111)
Creatinine, Ser: 0.97 mg/dL (ref 0.44–1.00)
GFR calc Af Amer: >60 mL/min (ref >60)
GFR calc non Af Amer: >60 mL/min (ref >60)
Glucose, Bld: 108 mg/dL — ABNORMAL HIGH (ref 70–99)
Potassium: 4 mmol/L (ref 3.5–5.1)
Sodium: 140 mmol/L (ref 135–145)
Total Bilirubin: 0.2 mg/dL — ABNORMAL LOW (ref 0.3–1.2)
Total Protein: 6.4 g/dL — ABNORMAL LOW (ref 6.5–8.1)

## 2018-05-25 MED ORDER — ACETAMINOPHEN 325 MG PO TABS: 650.0000 mg | ORAL_TABLET | Freq: Once | ORAL | Status: DC

## 2018-05-25 MED ORDER — DIPHENHYDRAMINE HCL 25 MG PO CAPS: 25.0000 mg | ORAL_CAPSULE | Freq: Once | ORAL | Status: DC

## 2018-05-25 MED ORDER — SODIUM CHLORIDE 0.9% FLUSH
10.0000 mL | INTRAVENOUS | Status: DC | PRN
  Administered 2018-05-25: 10 mL
  Filled 2018-05-25: qty 10

## 2018-05-25 MED ORDER — SODIUM CHLORIDE 0.9 % IV SOLN
Freq: Once | INTRAVENOUS | Status: AC
  Administered 2018-05-25: 08:00:00 via INTRAVENOUS

## 2018-05-25 MED ORDER — DENOSUMAB 120 MG/1.7ML ~~LOC~~ SOLN
SUBCUTANEOUS | Status: AC
  Filled 2018-05-25: qty 1.7

## 2018-05-25 MED ORDER — DENOSUMAB 120 MG/1.7ML ~~LOC~~ SOLN
120.0000 mg | Freq: Once | SUBCUTANEOUS | Status: AC
  Administered 2018-05-25: 120 mg via SUBCUTANEOUS

## 2018-05-25 MED ORDER — HEPARIN SOD (PORK) LOCK FLUSH 100 UNIT/ML IV SOLN
500.0000 [IU] | Freq: Once | INTRAVENOUS | Status: AC | PRN
Start: 2018-05-25 — End: 2018-05-25
  Administered 2018-05-25: 500 [IU]
  Filled 2018-05-25: qty 5

## 2018-05-25 MED ORDER — TRASTUZUMAB CHEMO 150 MG IV SOLR
450.0000 mg | Freq: Once | INTRAVENOUS | Status: AC
  Administered 2018-05-25: 450 mg via INTRAVENOUS
  Filled 2018-05-25: qty 21.43

## 2018-05-25 NOTE — Progress Notes (Signed)
Spoke w/ Dr. [name] - okay to treat with ECHO from 02/14/18. He will be repeating an ECHO at the end of her treatment, after her 7/24 dose.   Demetrius Charity, PharmD PGY2 Oncology Pharmacy Resident  Pharmacy Phone: [phone] 05/25/2018

## 2018-05-25 NOTE — Patient Instructions (Addendum)
[place] Discharge Instructions for Patients Receiving Chemotherapy  Today you received the following chemotherapy agents: Herceptin.  To help prevent nausea and vomiting after your treatment, we encourage you to take your nausea medication as directed  If you develop nausea and vomiting that is not controlled by your nausea medication, call the clinic.   BELOW ARE SYMPTOMS THAT SHOULD BE REPORTED IMMEDIATELY:  *FEVER GREATER THAN 100.5 F  *CHILLS WITH OR WITHOUT FEVER  NAUSEA AND VOMITING THAT IS NOT CONTROLLED WITH YOUR NAUSEA MEDICATION  *UNUSUAL SHORTNESS OF BREATH  *UNUSUAL BRUISING OR BLEEDING  TENDERNESS IN MOUTH AND THROAT WITH OR WITHOUT PRESENCE OF ULCERS  *URINARY PROBLEMS  *BOWEL PROBLEMS  UNUSUAL RASH Items with * indicate a potential emergency and should be followed up as soon as possible.  Feel free to call the clinic you have any questions or concerns. The clinic phone number is [phone]. Denosumab injection What is this medicine? DENOSUMAB (den oh sue mab) slows bone breakdown. Prolia is used to treat osteoporosis in women after menopause and in men. Delton See is used to treat a high calcium level due to cancer and to prevent bone fractures and other bone problems caused by multiple myeloma or cancer bone metastases. Delton See is also used to treat giant cell tumor of the bone. This medicine may be used for other purposes; ask your health care provider or pharmacist if you have questions. COMMON BRAND NAME(S): Prolia, XGEVA What should I tell my health care provider before I take this medicine? They need to know if you have any of these conditions: -dental disease -having surgery or tooth extraction -infection -kidney disease -low levels of calcium or Vitamin D in the blood -malnutrition -on hemodialysis -skin conditions or sensitivity -thyroid or parathyroid disease -an unusual reaction to denosumab, other medicines, foods, dyes, or  preservatives -pregnant or trying to get pregnant -breast-feeding How should I use this medicine? This medicine is for injection under the skin. It is given by a health care professional in a hospital or clinic setting. If you are getting Prolia, a special MedGuide will be given to you by the pharmacist with each prescription and refill. Be sure to read this information carefully each time. For Prolia, talk to your pediatrician regarding the use of this medicine in children. Special care may be needed. For Delton See, talk to your pediatrician regarding the use of this medicine in children. While this drug may be prescribed for children as young as 13 years for selected conditions, precautions do apply. Overdosage: If you think you have taken too much of this medicine contact a poison control center or emergency room at once. NOTE: This medicine is only for you. Do not share this medicine with others. What if I miss a dose? It is important not to miss your dose. Call your doctor or health care professional if you are unable to keep an appointment. What may interact with this medicine? Do not take this medicine with any of the following medications: -other medicines containing denosumab This medicine may also interact with the following medications: -medicines that lower your chance of fighting infection -steroid medicines like prednisone or cortisone This list may not describe all possible interactions. Give your health care provider a list of all the medicines, herbs, non-prescription drugs, or dietary supplements you use. Also tell them if you smoke, drink alcohol, or use illegal drugs. Some items may interact with your medicine. What should I watch for while using this medicine? Visit your  doctor or health care professional for regular checks on your progress. Your doctor or health care professional may order blood tests and other tests to see how you are doing. Call your doctor or health care  professional for advice if you get a fever, chills or sore throat, or other symptoms of a cold or flu. Do not treat yourself. This drug may decrease your body's ability to fight infection. Try to avoid being around people who are sick. You should make sure you get enough calcium and vitamin D while you are taking this medicine, unless your doctor tells you not to. Discuss the foods you eat and the vitamins you take with your health care professional. See your dentist regularly. Brush and floss your teeth as directed. Before you have any dental work done, tell your dentist you are receiving this medicine. Do not become pregnant while taking this medicine or for 5 months after stopping it. Talk with your doctor or health care professional about your birth control options while taking this medicine. Women should inform their doctor if they wish to become pregnant or think they might be pregnant. There is a potential for serious side effects to an unborn child. Talk to your health care professional or pharmacist for more information. What side effects may I notice from receiving this medicine? Side effects that you should report to your doctor or health care professional as soon as possible: -allergic reactions like skin rash, itching or hives, swelling of the face, lips, or tongue -bone pain -breathing problems -dizziness -jaw pain, especially after dental work -redness, blistering, peeling of the skin -signs and symptoms of infection like fever or chills; cough; sore throat; pain or trouble passing urine -signs of low calcium like fast heartbeat, muscle cramps or muscle pain; pain, tingling, numbness in the hands or feet; seizures -unusual bleeding or bruising -unusually weak or tired Side effects that usually do not require medical attention (report to your doctor or health care professional if they continue or are bothersome): -constipation -diarrhea -headache -joint pain -loss of  appetite -muscle pain -runny nose -tiredness -upset stomach This list may not describe all possible side effects. Call your doctor for medical advice about side effects. You may report side effects to FDA at 1-800-FDA-1088. Where should I keep my medicine? This medicine is only given in a clinic, doctor's office, or other health care setting and will not be stored at home. NOTE: This sheet is a summary. It may not cover all possible information. If you have questions about this medicine, talk to your doctor, pharmacist, or health care provider.  2018 Elsevier/Gold Standard (2016-12-08 19:17:21)

## 2018-05-26 LAB — CANCER ANTIGEN 27.29: CA 27.29: 9.2 U/mL (ref 0.0–38.6)

## 2018-06-06 ENCOUNTER — Other Ambulatory Visit: Payer: Self-pay | Admitting: Physician Assistant

## 2018-06-06 DIAGNOSIS — J302 Other seasonal allergic rhinitis: Secondary | ICD-10-CM

## 2018-06-07 ENCOUNTER — Encounter: Payer: Self-pay | Admitting: Gastroenterology

## 2018-06-22 ENCOUNTER — Inpatient Hospital Stay: Payer: Medicaid Other | Attending: Medical

## 2018-06-22 ENCOUNTER — Inpatient Hospital Stay: Payer: Medicaid Other

## 2018-06-22 ENCOUNTER — Other Ambulatory Visit: Payer: Self-pay | Admitting: Oncology

## 2018-06-22 VITALS — BP 127/76 | HR 87 | Temp 98.4°F | Resp 18 | Wt 165.0 lb

## 2018-06-22 DIAGNOSIS — C50312 Malignant neoplasm of lower-inner quadrant of left female breast: Secondary | ICD-10-CM

## 2018-06-22 DIAGNOSIS — C7931 Secondary malignant neoplasm of brain: Secondary | ICD-10-CM | POA: Diagnosis not present

## 2018-06-22 DIAGNOSIS — Z5112 Encounter for antineoplastic immunotherapy: Secondary | ICD-10-CM | POA: Insufficient documentation

## 2018-06-22 DIAGNOSIS — C7951 Secondary malignant neoplasm of bone: Secondary | ICD-10-CM | POA: Insufficient documentation

## 2018-06-22 LAB — COMPREHENSIVE METABOLIC PANEL
ALT: 15 U/L (ref 0–44)
AST: 16 U/L (ref 15–41)
Albumin: 4 g/dL (ref 3.5–5.0)
Alkaline Phosphatase: 67 U/L (ref 38–126)
Anion gap: 9 (ref 5–15)
BUN: 12 mg/dL (ref 6–20)
CO2: 28 mmol/L (ref 22–32)
Calcium: 9.8 mg/dL (ref 8.9–10.3)
Chloride: 103 mmol/L (ref 98–111)
Creatinine, Ser: 1.01 mg/dL — ABNORMAL HIGH (ref 0.44–1.00)
GFR calc Af Amer: >60 mL/min (ref >60)
GFR calc non Af Amer: >60 mL/min (ref >60)
Glucose, Bld: 101 mg/dL — ABNORMAL HIGH (ref 70–99)
Potassium: 4.2 mmol/L (ref 3.5–5.1)
Sodium: 140 mmol/L (ref 135–145)
Total Bilirubin: <0.2 mg/dL — ABNORMAL LOW (ref 0.3–1.2)
Total Protein: 6.8 g/dL (ref 6.5–8.1)

## 2018-06-22 LAB — CBC WITH DIFFERENTIAL/PLATELET
Basophils Absolute: 0 10*3/uL (ref 0.0–0.1)
Basophils Relative: 0 %
Eosinophils Absolute: 0 10*3/uL (ref 0.0–0.5)
Eosinophils Relative: 1 %
HCT: 39.4 % (ref 34.8–46.6)
Hemoglobin: 13 g/dL (ref 11.6–15.9)
Lymphocytes Relative: 22 %
Lymphs Abs: 1 10*3/uL (ref 0.9–3.3)
MCH: 31 pg (ref 25.1–34.0)
MCHC: 33 g/dL (ref 31.5–36.0)
MCV: 94 fL (ref 79.5–101.0)
Monocytes Absolute: 0.4 10*3/uL (ref 0.1–0.9)
Monocytes Relative: 10 %
Neutro Abs: 3 10*3/uL (ref 1.5–6.5)
Neutrophils Relative %: 67 %
Platelets: 176 10*3/uL (ref 145–400)
RBC: 4.19 MIL/uL (ref 3.70–5.45)
RDW: 13.1 % (ref 11.2–14.5)
WBC: 4.5 10*3/uL (ref 3.9–10.3)

## 2018-06-22 MED ORDER — ACETAMINOPHEN 325 MG PO TABS
ORAL_TABLET | ORAL | Status: AC
  Filled 2018-06-22: qty 2

## 2018-06-22 MED ORDER — SODIUM CHLORIDE 0.9% FLUSH
10.0000 mL | INTRAVENOUS | Status: DC | PRN
  Administered 2018-06-22: 10 mL
  Filled 2018-06-22: qty 10

## 2018-06-22 MED ORDER — DENOSUMAB 120 MG/1.7ML ~~LOC~~ SOLN
120.0000 mg | Freq: Once | SUBCUTANEOUS | Status: AC
  Administered 2018-06-22: 120 mg via SUBCUTANEOUS

## 2018-06-22 MED ORDER — HEPARIN SOD (PORK) LOCK FLUSH 100 UNIT/ML IV SOLN
500.0000 [IU] | Freq: Once | INTRAVENOUS | Status: AC | PRN
  Administered 2018-06-22: 500 [IU]
  Filled 2018-06-22: qty 5

## 2018-06-22 MED ORDER — DIPHENHYDRAMINE HCL 25 MG PO CAPS: 25.0000 mg | ORAL_CAPSULE | Freq: Once | ORAL | Status: DC

## 2018-06-22 MED ORDER — TRASTUZUMAB CHEMO 150 MG IV SOLR
450.0000 mg | Freq: Once | INTRAVENOUS | Status: AC
  Administered 2018-06-22: 450 mg via INTRAVENOUS
  Filled 2018-06-22: qty 21.43

## 2018-06-22 MED ORDER — ACETAMINOPHEN 325 MG PO TABS
650.0000 mg | ORAL_TABLET | Freq: Once | ORAL | Status: AC
  Administered 2018-06-22: 650 mg via ORAL

## 2018-06-22 MED ORDER — DENOSUMAB 120 MG/1.7ML ~~LOC~~ SOLN
SUBCUTANEOUS | Status: AC
  Filled 2018-06-22: qty 1.7

## 2018-06-22 MED ORDER — SODIUM CHLORIDE 0.9 % IV SOLN
Freq: Once | INTRAVENOUS | Status: AC
  Administered 2018-06-22: 10:00:00 via INTRAVENOUS
  Filled 2018-06-22: qty 250

## 2018-06-22 NOTE — Patient Instructions (Signed)
[facility] Discharge Instructions for Patients Receiving Chemotherapy  Today you received the following chemotherapy agents Herceptin   To help prevent nausea and vomiting after your treatment, we encourage you to take your nausea medication as directed.    If you develop nausea and vomiting that is not controlled by your nausea medication, call the clinic.   BELOW ARE SYMPTOMS THAT SHOULD BE REPORTED IMMEDIATELY:  *FEVER GREATER THAN 100.5 F  *CHILLS WITH OR WITHOUT FEVER  NAUSEA AND VOMITING THAT IS NOT CONTROLLED WITH YOUR NAUSEA MEDICATION  *UNUSUAL SHORTNESS OF BREATH  *UNUSUAL BRUISING OR BLEEDING  TENDERNESS IN MOUTH AND THROAT WITH OR WITHOUT PRESENCE OF ULCERS  *URINARY PROBLEMS  *BOWEL PROBLEMS  UNUSUAL RASH Items with * indicate a potential emergency and should be followed up as soon as possible.  Feel free to call the clinic should you have any questions or concerns. The clinic phone number is [phone].  Please show the CHEMO ALERT CARD at check-in to the Emergency Department and triage nurse.  Denosumab injection What is this medicine? DENOSUMAB (den oh sue mab) slows bone breakdown. Prolia is used to treat osteoporosis in women after menopause and in men. Xgeva is used to treat a high calcium level due to cancer and to prevent bone fractures and other bone problems caused by multiple myeloma or cancer bone metastases. Xgeva is also used to treat giant cell tumor of the bone. This medicine may be used for other purposes; ask your health care provider or pharmacist if you have questions. COMMON BRAND NAME(S): Prolia, XGEVA What should I tell my health care provider before I take this medicine? They need to know if you have any of these conditions: -dental disease -having surgery or tooth extraction -infection -kidney disease -low levels of calcium or Vitamin D in the blood -malnutrition -on hemodialysis -skin conditions or  sensitivity -thyroid or parathyroid disease -an unusual reaction to denosumab, other medicines, foods, dyes, or preservatives -pregnant or trying to get pregnant -breast-feeding How should I use this medicine? This medicine is for injection under the skin. It is given by a health care professional in a hospital or clinic setting. If you are getting Prolia, a special MedGuide will be given to you by the pharmacist with each prescription and refill. Be sure to read this information carefully each time. For Prolia, talk to your pediatrician regarding the use of this medicine in children. Special care may be needed. For Xgeva, talk to your pediatrician regarding the use of this medicine in children. While this drug may be prescribed for children as [name] as 13 years for selected conditions, precautions do apply. Overdosage: If you think you have taken too much of this medicine contact a poison control center or emergency room at once. NOTE: This medicine is only for you. Do not share this medicine with others. What if I miss a dose? It is important not to miss your dose. Call your doctor or health care professional if you are unable to keep an appointment. What may interact with this medicine? Do not take this medicine with any of the following medications: -other medicines containing denosumab This medicine may also interact with the following medications: -medicines that lower your chance of fighting infection -steroid medicines like prednisone or cortisone This list may not describe all possible interactions. Give your health care provider a list of all the medicines, herbs, non-prescription drugs, or dietary supplements you use. Also tell them if you smoke, drink alcohol, or   use illegal drugs. Some items may interact with your medicine. What should I watch for while using this medicine? Visit your doctor or health care professional for regular checks on your progress. Your doctor or health care  professional may order blood tests and other tests to see how you are doing. Call your doctor or health care professional for advice if you get a fever, chills or sore throat, or other symptoms of a cold or flu. Do not treat yourself. This drug may decrease your body's ability to fight infection. Try to avoid being around people who are sick. You should make sure you get enough calcium and vitamin D while you are taking this medicine, unless your doctor tells you not to. Discuss the foods you eat and the vitamins you take with your health care professional. See your dentist regularly. Brush and floss your teeth as directed. Before you have any dental work done, tell your dentist you are receiving this medicine. Do not become pregnant while taking this medicine or for 5 months after stopping it. Talk with your doctor or health care professional about your birth control options while taking this medicine. Women should inform their doctor if they wish to become pregnant or think they might be pregnant. There is a potential for serious side effects to an unborn child. Talk to your health care professional or pharmacist for more information. What side effects may I notice from receiving this medicine? Side effects that you should report to your doctor or health care professional as soon as possible: -allergic reactions like skin rash, itching or hives, swelling of the face, lips, or tongue -bone pain -breathing problems -dizziness -jaw pain, especially after dental work -redness, blistering, peeling of the skin -signs and symptoms of infection like fever or chills; cough; sore throat; pain or trouble passing urine -signs of low calcium like fast heartbeat, muscle cramps or muscle pain; pain, tingling, numbness in the hands or feet; seizures -unusual bleeding or bruising -unusually weak or tired Side effects that usually do not require medical attention (report to your doctor or health care professional  if they continue or are bothersome): -constipation -diarrhea -headache -joint pain -loss of appetite -muscle pain -runny nose -tiredness -upset stomach This list may not describe all possible side effects. Call your doctor for medical advice about side effects. You may report side effects to FDA at 1-800-FDA-1088. Where should I keep my medicine? This medicine is only given in a clinic, doctor's office, or other health care setting and will not be stored at home. NOTE: This sheet is a summary. It may not cover all possible information. If you have questions about this medicine, talk to your doctor, pharmacist, or health care provider.  2018 Elsevier/Gold Standard (2016-12-08 19:17:21)

## 2018-06-23 LAB — CANCER ANTIGEN 27.29: CA 27.29: 13.1 U/mL (ref 0.0–38.6)

## 2018-06-25 ENCOUNTER — Other Ambulatory Visit: Payer: Self-pay | Admitting: Oncology

## 2018-06-27 ENCOUNTER — Encounter: Payer: Self-pay | Admitting: Gastroenterology

## 2018-06-28 ENCOUNTER — Other Ambulatory Visit: Payer: Self-pay | Admitting: Family Medicine

## 2018-06-30 ENCOUNTER — Ambulatory Visit (HOSPITAL_BASED_OUTPATIENT_CLINIC_OR_DEPARTMENT_OTHER): Payer: Medicaid Other

## 2018-06-30 ENCOUNTER — Other Ambulatory Visit: Payer: Self-pay

## 2018-06-30 DIAGNOSIS — C50312 Malignant neoplasm of lower-inner quadrant of left female breast: Secondary | ICD-10-CM

## 2018-06-30 DIAGNOSIS — C7951 Secondary malignant neoplasm of bone: Secondary | ICD-10-CM | POA: Diagnosis present

## 2018-06-30 DIAGNOSIS — C7931 Secondary malignant neoplasm of brain: Secondary | ICD-10-CM | POA: Diagnosis present

## 2018-06-30 IMAGING — CT CT CHEST W/ CM
2 of 3 series · 15 of 36 positions shown, 18 images · IV contrast (iopamidol)
Comparison: 06/17/2016.

CLINICAL DATA: Breast cancer with brain metastases and bone
metastases.

EXAM:
CT CHEST WITH CONTRAST
TECHNIQUE: Multidetector CT imaging of the chest was performed during
intravenous contrast administration.
CONTRAST:  75mL [id number] IOPAMIDOL ([id number]) INJECTION 61%

[Series 2: chest with st · axial · 0.68mm/px · z∈[-480,-226]mm · 12 of 151 slices shown, 15 images]
[im 12/151  mediastinal]
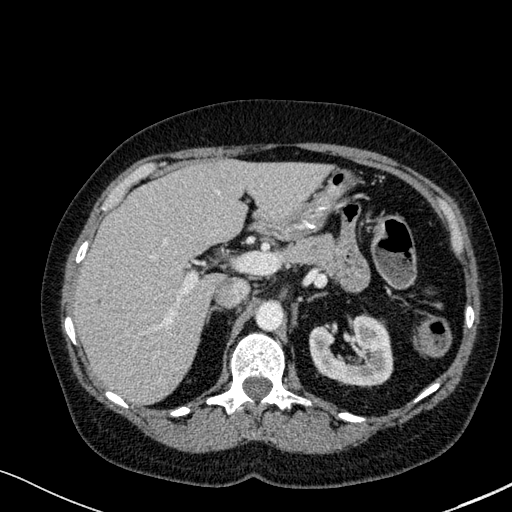
[im 12/151  lung]
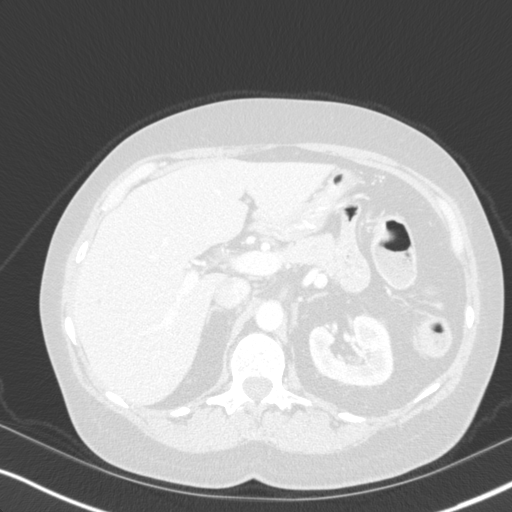
[im 23/151  lung]
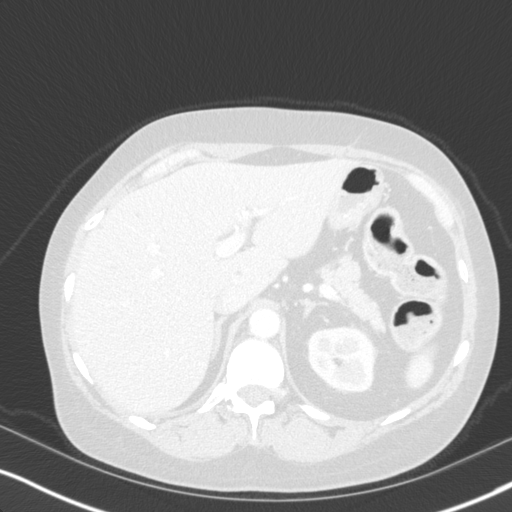
[im 34/151  lung]
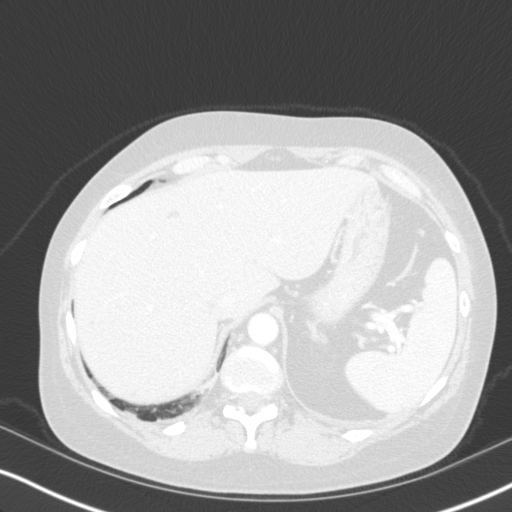
[im 45/151  lung]
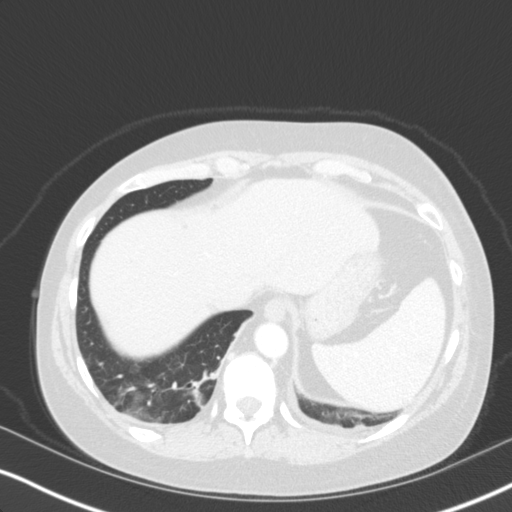
[im 56/151  mediastinal]
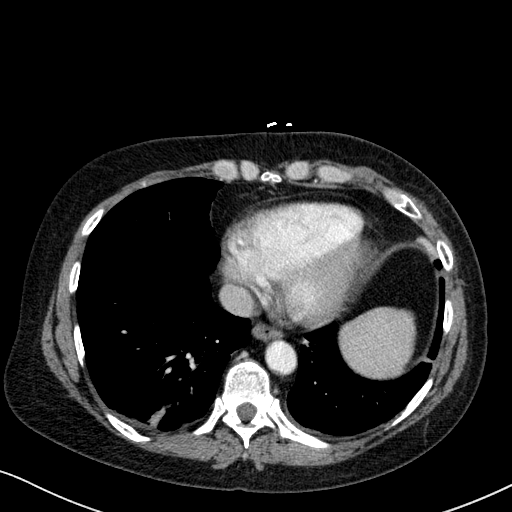
[im 56/151  lung]
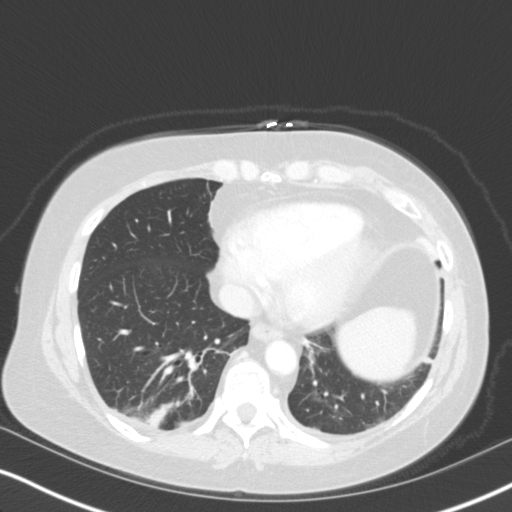
[im 67/151  lung]
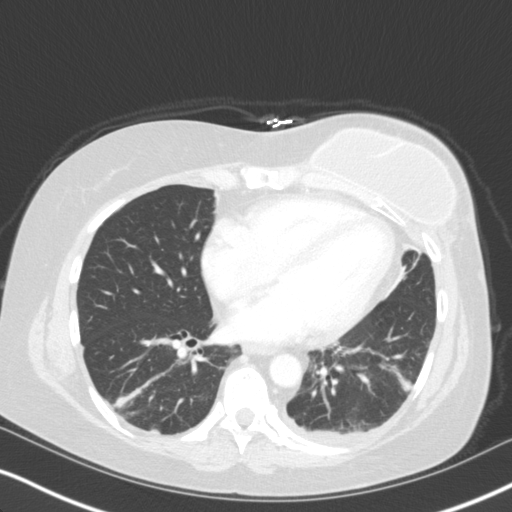
[im 84/151  lung]
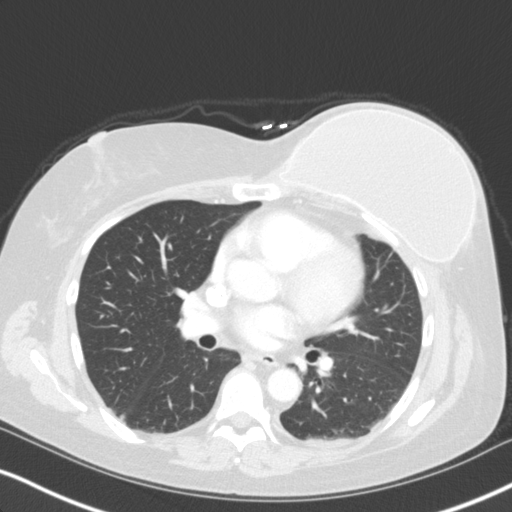
[im 95/151  lung]
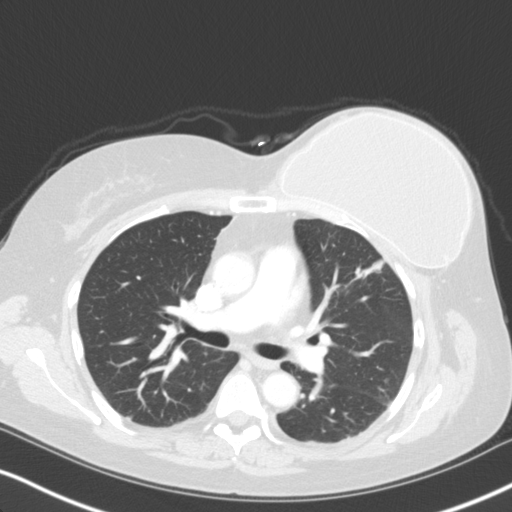
[im 106/151  mediastinal]
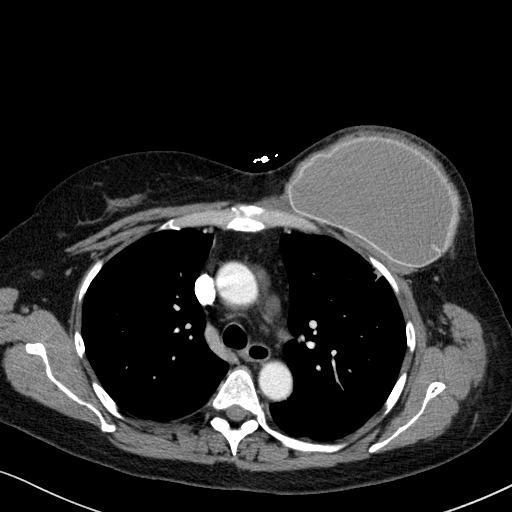
[im 106/151  lung]
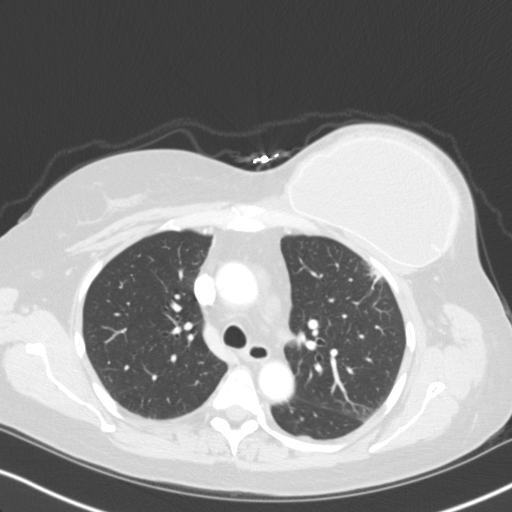
[im 117/151  lung]
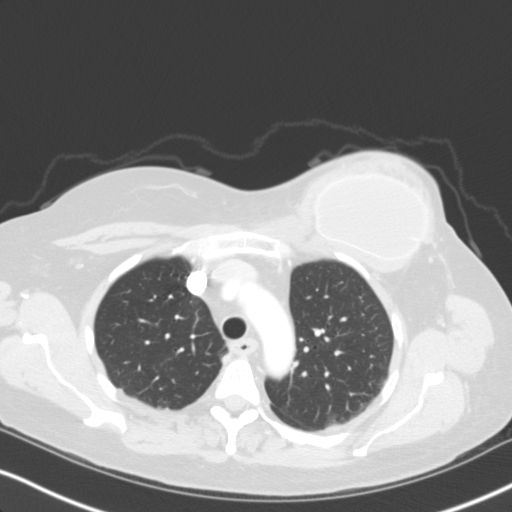
[im 128/151  lung]
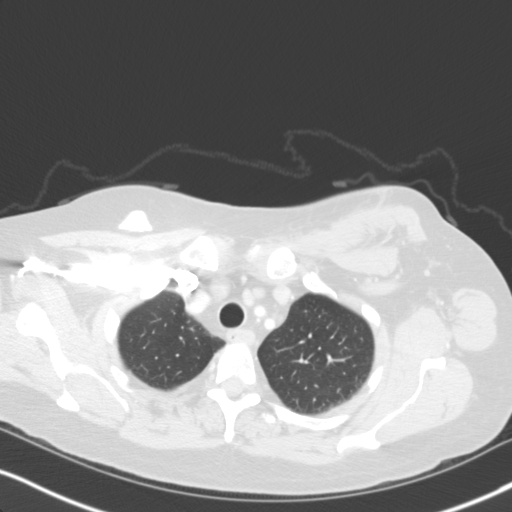
[im 139/151  lung]
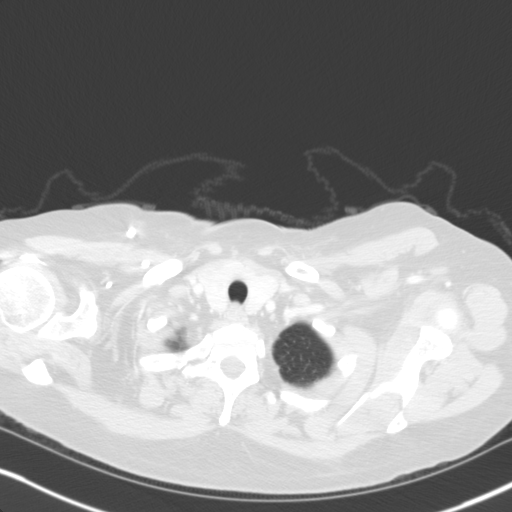

[Series 3: coronals · coronal · 0.59mm/px · 3 of 87 slices shown]
[im 18/87  lung]
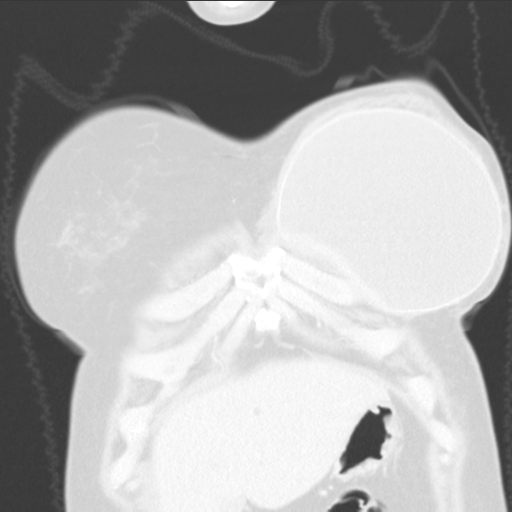
[im 35/87  lung]
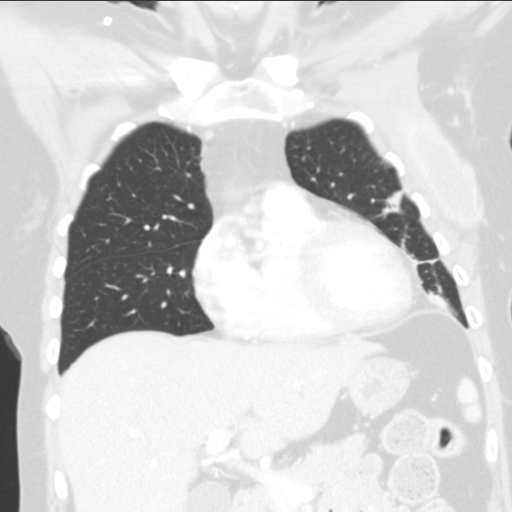
[im 52/87  lung]
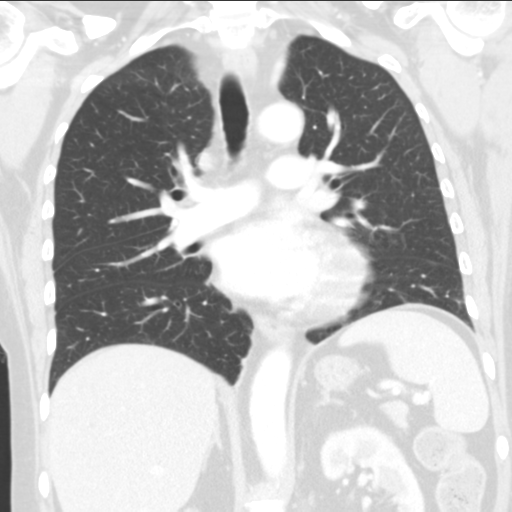

[15 of 36 positions shown; findings below may reference images not displayed]

FINDINGS: Cardiovascular: The heart size is moderately enlarged. There is no
pericardial effusion identified.

Mediastinum/Nodes: The trachea appears patent and is midline. Normal
appearance of the esophagus. There is no mediastinal or hilar
adenopathy identified. Low-attenuation nodule in the left lobe of
thyroid gland measures 8 mm. No axillary or supraclavicular
adenopathy identified. Previous left mastectomy with implant
reconstruction.

Lungs/Pleura: There is mild pleural thickening overlying the
posterior lung bases. Subsegmental atelectasis versus scar
identified within both lung bases. Anterior left upper lobe
subsegmental scar versus atelectasis also noted. Pulmonary nodule
within the left upper lobe measures 4 mm, image 56 of series 7.
Unchanged from previous exam.

Upper Abdomen: Multiple low-attenuation foci are identified
throughout the liver. Favored to represent multiple cysts. The
gallbladder appears normal. The visualized portions of the adrenal
glands are normal. No acute abnormality noted.

Musculoskeletal: There is subtle increase sclerosis involving the T9
vertebra which appears similar to 06/17/2016. In retrospect this is
a new finding when compared with 06/06/2015. Within the T6 vertebra
there is subtle asymmetric increased sclerosis on the left, image 70
of series 3. This also appears new from 06/06/2015 but is similar to
06/17/2016.
IMPRESSION: 1. Stable CT of the chest compared with 06/17/2016.
2. There is subtle increased scleroses within the T6 and T9 vertebra
which, in retrospect is similar to 06/17/2016 but is new when
compared with 06/06/2015. Findings are suspicious for bone
metastases.

## 2018-07-01 ENCOUNTER — Ambulatory Visit (HOSPITAL_COMMUNITY)
Admission: RE | Admit: 2018-07-01 | Discharge: 2018-07-01 | Disposition: A | Discharge status: Home / Self Care | Payer: Medicaid Other | Source: Ambulatory Visit | Attending: Oncology | Admitting: Oncology

## 2018-07-01 ENCOUNTER — Other Ambulatory Visit: Payer: Self-pay | Admitting: Family Medicine

## 2018-07-01 ENCOUNTER — Telehealth: Payer: Self-pay | Admitting: *Deleted

## 2018-07-01 DIAGNOSIS — C7951 Secondary malignant neoplasm of bone: Secondary | ICD-10-CM | POA: Insufficient documentation

## 2018-07-01 DIAGNOSIS — G8929 Other chronic pain: Secondary | ICD-10-CM

## 2018-07-01 DIAGNOSIS — C7931 Secondary malignant neoplasm of brain: Secondary | ICD-10-CM | POA: Insufficient documentation

## 2018-07-01 DIAGNOSIS — R519 Headache, unspecified: Secondary | ICD-10-CM

## 2018-07-01 DIAGNOSIS — M62838 Other muscle spasm: Secondary | ICD-10-CM

## 2018-07-01 DIAGNOSIS — C50312 Malignant neoplasm of lower-inner quadrant of left female breast: Secondary | ICD-10-CM | POA: Insufficient documentation

## 2018-07-01 DIAGNOSIS — C50919 Malignant neoplasm of unspecified site of unspecified female breast: Secondary | ICD-10-CM

## 2018-07-01 DIAGNOSIS — R51 Headache: Principal | ICD-10-CM

## 2018-07-01 MED ORDER — IOPAMIDOL (ISOVUE-300) INJECTION 61%
INTRAVENOUS | Status: AC
  Administered 2018-07-01: 75 mL
  Filled 2018-07-01: qty 100

## 2018-07-01 NOTE — Telephone Encounter (Signed)
This RN called pt per her request with results of CT chest done this am.

## 2018-07-02 ENCOUNTER — Other Ambulatory Visit: Payer: Self-pay | Admitting: Family Medicine

## 2018-07-02 DIAGNOSIS — R519 Headache, unspecified: Secondary | ICD-10-CM

## 2018-07-02 DIAGNOSIS — G8929 Other chronic pain: Secondary | ICD-10-CM

## 2018-07-02 DIAGNOSIS — R51 Headache: Principal | ICD-10-CM

## 2018-07-02 DIAGNOSIS — M62838 Other muscle spasm: Secondary | ICD-10-CM

## 2018-07-03 ENCOUNTER — Encounter: Payer: Self-pay | Admitting: Oncology

## 2018-07-10 ENCOUNTER — Other Ambulatory Visit: Payer: Self-pay | Admitting: Family Medicine

## 2018-07-10 DIAGNOSIS — C50312 Malignant neoplasm of lower-inner quadrant of left female breast: Secondary | ICD-10-CM

## 2018-07-14 ENCOUNTER — Encounter: Payer: Self-pay | Admitting: Gastroenterology

## 2018-07-18 NOTE — Progress Notes (Signed)
[place]  [phone] [phone]     ID: [name] DOB: August 10, 1961  MR#: [id number]  WKM#:[id number]  Patient Care Team: Charlott Rakes, MD as PCP - General (Family Medicine) Kyung Rudd, MD as Consulting Physician (Radiation Oncology) Erline Levine, MD as Consulting Physician (Neurosurgery) Corena Pilgrim, MD as Consulting Physician (Psychiatry) Mickeal Skinner Acey Lav, MD as Consulting Physician (Psychiatry) Nehemiah Settle, MD as Referring Physician (Plastic Surgery) Irene Limbo, MD as Consulting Physician (Plastic Surgery)  CHIEF COMPLAINT: Metastatic triple positive breast cancer  CURRENT TREATMENT: Trastuzumab, denosumab, anastrozole   INTERVAL HISTORY: [name] returns today for a follow-up and treatment of her metastatic triple positive breast cancer. She continues on anastrozole, with good tolerance. She denies issues with hot flashes. She has vaginal dryness, and she aids this with coconut oil.   She also receives trastuzumab every 28 days, with a dose due today. She tolerates this well. She denies fatigue or issues with diarrhea.  She completed an echocardiogram on 06/30/2018 showing an ejection fraction in the 55-60% range.   Finally, she receives denosumab/Xgeva monthly, with a dose due today. She also tolerates this well without any complications.   Since her last visit, she completed a chest CT on 07/01/2018 showing: No definite findings of metastatic disease in the thorax. Previously noted borderline enlarged low right paratracheal lymph node is stable to slightly decreased in size compared to the prior examination.   She has an appointment with Dr. Mickeal Skinner on 08/16/2018 with a brain MRI to be performed shortly before that visit, not yet scheduled.   REVIEW OF SYSTEMS: Faven reports that she has bald spots on her head. She has been busy with work. She has a new grandson who lives in [place]. She is planning to move by  the end of the year. She doesn't have as much energy as she used to. She denies unusual headaches, visual changes, nausea, vomiting, or dizziness. There has been no unusual cough, phlegm production, or pleurisy. There has been no change in bowel or bladder habits. She denies unexplained fatigue or unexplained weight loss, bleeding, rash, or fever. A detailed review of systems was otherwise stable.       BREAST CANCER HISTORY: From the original intake note:  [name] was aware of a "lemon sized lump in" her left axilla for about a year before bringing it to medical attention. By then she had developed left breast and left axillary swelling (June 2016). She presented to the local emergency room and had a chest CT scan 06/06/2015 which showed a nodule in the left breast measuring 0.9 cm and questionable left axillary adenopathy. She then proceeded to bilateral diagnostic mammography and left breast ultrasonography 06/19/2015. There were no prior films for comparison (last mammography 12 years prior).. The breast density was category C. Mammography showed in the left breast upper inner quadrant a 7 cm area including a small mass and significant pleomorphic calcifications. Ultrasonography defined the mass as measuring 1.2 cm. The left axilla appeared unremarkable. There was significant skin edema.  Biopsy of the left breast mass 06/19/2015 showed (SP [id number]) an invasive ductal carcinoma, grade 2, estrogen receptor 83% positive, progesterone receptor 26% positive, and HER-2 amplified by immunohistochemstry with a 3+ reading. The patient had biopsies of a separate area in the left breast August of the same year and this showed atypical ductal hyperplasia. (SP [id number]).  Accordingly after appropriate discussion on 08/21/2015 the patient proceeded to left mastectomy with left axillary sentinel lymph node sampling,  which, since the lymph nodes were positive, extended to the procedure to left axillary lymph node  dissection. The pathology (SP [id number]) showed an invasive ductal carcinoma, grade 3, measuring in excess of 9 cm. There were also skin satellites, not contiguous with the invasive carcinoma. Margins were clear and ample. There was evidence of lymphovascular invasion. A total of 15 lymph nodes were removed, including 5 sentinel lymph nodes, all of which were positive, so that the final total was 14 out of 15 lymph nodes involved by tumor. There was evidence of extranodal extension. The final pathology was pT4b pN2a, stage IIIB  CA-27-29 and CEA 09/19/2015 were non-informative October 2016.  Unfortunately CT scans of the chest abdomen and pelvis 09/16/2015 showed bony metastases to the right scapula, left iliac crest, and also L4 and T-spine. There were questionable liver cysts which on repeat CT scan 03/02/2016 appear to be a little bit more well-defined, possibly a little larger. There were also some possible right upper lobe lung lesions.  Adjuvant treatment consisted of docetaxel, trastuzumab and pertuzumab, with the final (6th) docetaxel dose given 02/11/2016. She continues on trastuzumab and pertuzumab, with the 11th cycle given 05/05/2016. Echocardiogram 02/26/2016 showed an ejection fraction of 55%. She receives denosumab/Xgeva every 4 weeks.. She also receives radiation, started 06/09/201, to be completed 06/26/2016.  Her subsequent history is as detailed below   PAST MEDICAL HISTORY: Past Medical History:  Diagnosis Date  . Alcohol abuse   . Anemia    during chemo  . Anxiety    At age 57  . Arthritis Dx 2010  . Bipolar disorder ([place])   . Bronchitis   . Cancer ([place])    breast mets to brain  . Chronic pain   . Complication of anesthesia   . Depression   . Family history of adverse reaction to anesthesia    parents had PONV  . Fibromyalgia Dx 2005  . GERD (gastroesophageal reflux disease)   . Headache    hx  migraines  . Lymphedema of left arm   . Opiate dependence ([place])   .  PONV (postoperative nausea and vomiting)   . Port-A-Cath in place   . PTSD (post-traumatic stress disorder)      PAST SURGICAL HISTORY: Past Surgical History:  Procedure Laterality Date  . APPLICATION OF CRANIAL NAVIGATION N/A 08/14/2016   Procedure: APPLICATION OF CRANIAL NAVIGATION;  Surgeon: Erline Levine, MD;  Location: [facility];  Service: Neurosurgery;  Laterality: N/A;  . BREAST RECONSTRUCTION Left    with silicone implant  . CRANIOTOMY N/A 08/14/2016   Procedure: CRANIOTOMY TUMOR EXCISION WITH Lucky Rathke;  Surgeon: Erline Levine, MD;  Location: [facility];  Service: Neurosurgery;  Laterality: N/A;  . FIBULA FRACTURE SURGERY Left   . MASTECTOMY Left   . RADIOLOGY WITH ANESTHESIA N/A 07/23/2016   Procedure: MRI OF BRAIN WITH AND WITHOUT;  Surgeon: Medication Radiologist, MD;  Location: [facility];  Service: Radiology;  Laterality: N/A;  . RADIOLOGY WITH ANESTHESIA N/A 09/08/2016   Procedure: MRI OF BRAIN WITH AND WITHOUT CONTRAST;  Surgeon: Medication Radiologist, MD;  Location: [facility];  Service: Radiology;  Laterality: N/A;  . RADIOLOGY WITH ANESTHESIA N/A 12/10/2016   Procedure: MRI OF BRAIN WITH AND WITHOUT;  Surgeon: Medication Radiologist, MD;  Location: [facility];  Service: Radiology;  Laterality: N/A;  . RADIOLOGY WITH ANESTHESIA N/A 03/02/2017   Procedure: MRI of BRAIN W and W/OUT CONTRAST;  Surgeon: Medication Radiologist, MD;  Location: [facility];  Service: Radiology;  Laterality:  N/A;  . RADIOLOGY WITH ANESTHESIA N/A 07/29/2017   Procedure: RADIOLOGY WITH ANESTHESIA MRI OF BRAIN WITH AND WITHOUT CONTRAST;  Surgeon: Radiologist, Medication, MD;  Location: [facility];  Service: Radiology;  Laterality: N/A;  . RADIOLOGY WITH ANESTHESIA N/A 12/07/2017   Procedure: MRI WITH ANESTHESIA OF BRAIN WITH AND WITHOUT CONTRAST;  Surgeon: Radiologist, Medication, MD;  Location: [facility];  Service: Radiology;  Laterality: N/A;  . RADIOLOGY WITH ANESTHESIA N/A 04/07/2018   Procedure: MRI OF BRAIN WITH AND WITHOUT  CONTRAST;  Surgeon: Radiologist, Medication, MD;  Location: [facility];  Service: Radiology;  Laterality: N/A;  . right power port placement Right      FAMILY HISTORY Family History  Problem Relation Age of Onset  . Diabetes Mother   . Bipolar disorder Mother   . CAD Father    The patient's father still living, age 70, in [place]. He had prostate cancer at some point in the past. The patient's mother died at age 20 from complications of diabetes. The patient had no brothers, 2 sisters. A paternal grandmother had lung cancer in the setting of tobacco abuse. There is no other history of cancer in the family to her knowledge  GYNECOLOGIC HISTORY:  No LMP recorded. Patient has had an ablation. Menarche approximately age 18. First live birth in 49. The patient is GX P2. She underwent endometrial ablation in 2016.  SOCIAL HISTORY: Updated August 2019 The patient is originally from [place]. She has lived in [place] before but more recently was in [place]. She is back here because she could not afford her rent in [place]. She is living here and a temporary situation. She is divorced. Her 2 children are [name] who lives in [place] and works as a Development worker, community, and [name] who also lives in [place] and works as a Catering manager. The patient has a grandchild, Arelia Longest, 20 years old as of July 2019, living in [place] with his mother. The patient also has a grandson born in February 2019, who also lives in [place]. The patient has not established herself with a local church yet.    ADVANCED DIRECTIVES: Not in place; at the 06/03/2016 visit the patient was given the appropriate forms to complete and notarize at her discretion   HEALTH MAINTENANCE: Social History   Tobacco Use  . Smoking status: Current Every Day Smoker    Packs/day: 1.00    Types: Cigarettes  . Smokeless tobacco: Never Used  Substance Use Topics  . Alcohol use: No    Comment: no ETOH since 08/22/12  . Drug use: No     Comment: states she's in recovery program for 5 years     Colonoscopy:  PAP:  Bone density:   Allergies  Allergen Reactions  . Demerol Itching and Nausea And Vomiting  . Erythromycin Rash    Current Outpatient Medications on File Prior to Visit  Medication Sig Dispense Refill  . amoxicillin (AMOXIL) 500 MG capsule Take 2 capsules (1,000 mg total) by mouth 2 (two) times daily. 40 capsule 0  . anastrozole (ARIMIDEX) 1 MG tablet Take 1 tablet (1 mg total) daily by mouth. 30 tablet 0  . anastrozole (ARIMIDEX) 1 MG tablet TAKE 1 TABLET BY MOUTH EVERY DAY 90 tablet 1  . benzonatate (TESSALON) 100 MG capsule Take 2 capsules (200 mg total) by mouth 3 (three) times daily as needed for cough. (Patient not taking: Reported on 04/01/2018) 40 capsule 0  . Biotin 1000 MCG tablet Take 1,000 mcg by mouth daily.    [name]  Cholecalciferol (VITAMIN D3) 5000 units CAPS Take 5,000 Units by mouth daily.    . cyclobenzaprine (FLEXERIL) 5 MG tablet TAKE 2 TABLET BY MOUTH TWICE A DAY AS NEEDED FOR MUSCLE SPASMS 120 tablet 0  . cycloSPORINE (RESTASIS) 0.05 % ophthalmic emulsion Place 1 drop into both eyes 2 (two) times daily.    [name] docusate sodium (COLACE) 100 MG capsule Take 100 mg by mouth daily as needed for mild constipation.     . fluticasone (FLONASE) 50 MCG/ACT nasal spray SPRAY 2 SPRAYS INTO EACH NOSTRIL EVERY DAY 16 g 2  . gabapentin (NEURONTIN) 300 MG capsule Take 1 capsule (300 mg total) by mouth 2 (two) times daily. (Patient taking differently: Take 300-600 mg by mouth See admin instructions. Take 300 mg by mouth in the morning and take 600 mg by mouth at bedtime) 60 capsule 3  . hydrOXYzine (ATARAX/VISTARIL) 25 MG tablet Take 1 tablet (25 mg total) by mouth 3 (three) times daily as needed. Pt may take 1-2 tablets 3 times daily as needed 60 tablet 0  . ibuprofen (ADVIL,MOTRIN) 800 MG tablet TAKE 1 TABLET BY MOUTH THREE TIMES A DAY 90 tablet 0  . lamoTRIgine (LAMICTAL) 25 MG tablet Take 1 tablet (25 mg  total) by mouth 2 (two) times daily. (Patient taking differently: Take 75 mg by mouth every morning. ) 60 tablet 0  . loratadine (CLARITIN) 10 MG tablet Take 10 mg by mouth daily.    [name] loratadine (CLARITIN) 10 MG tablet TAKE 1 TABLET BY MOUTH EVERY DAY 30 tablet 11  . lurasidone (LATUDA) 40 MG TABS tablet Take 1 tablet (40 mg total) by mouth daily with breakfast. 30 tablet 0  . ondansetron (ZOFRAN) 8 MG tablet Take 1 tablet (8 mg total) by mouth every 8 (eight) hours as needed for nausea or vomiting. 90 tablet 1  . pantoprazole (PROTONIX) 40 MG tablet Take 1 tablet (40 mg total) by mouth daily. 30 tablet 3  . polyethylene glycol (MIRALAX / GLYCOLAX) packet TAKE 17 G BY MOUTH DAILY AS NEEDED FOR MILD CONSTIPATION. 30 packet 0  . traZODone (DESYREL) 100 MG tablet Take 1 tablet (100 mg total) by mouth at bedtime. (Patient taking differently: Take 200 mg by mouth at bedtime. ) 30 tablet 0   No current facility-administered medications on file prior to visit.      OBJECTIVE: Middle-aged white woman who appears well  Vitals:   07/19/18 0830  BP: 123/70  Pulse: 80  Resp: 18  Temp: 98.6 F (37 C)  TempSrc: Oral  SpO2: 100%  Weight: 167 lb (75.8 kg)  Height: 5' 5"  (1.651 m)  Body mass index is 27.79 kg/m.  ECOG FS: 1 - Symptomatic but completely ambulatory  Sclerae unicteric, pupils round and equal Oropharynx clear and moist No cervical or supraclavicular adenopathy Lungs no rales or rhonchi Heart regular rate and rhythm Abd soft, nontender, positive bowel sounds MSK no focal spinal tenderness, no upper extremity lymphedema Neuro: nonfocal, well oriented, appropriate affect Breasts: The right breast is benign.  The left breast is status post mastectomy and expander placement.  There is no evidence of chest wall recurrence.  Both axillae are benign. Skin: Hair is thinning.  LAB RESULTS: No results found for: LABCA2  CBC    Component Value Date/Time   WBC 4.2 07/19/2018 0817    RBC 4.06 07/19/2018 0817   HGB 12.7 07/19/2018 0817   HGB 12.1 10/26/2017 1038   HCT 38.2 07/19/2018 0817   HCT 35.7 10/26/2017  1038   PLT 177 07/19/2018 0817   PLT 167 10/26/2017 1038   MCV 94.1 07/19/2018 0817   MCV 98.4 10/26/2017 1038   MCH 31.3 07/19/2018 0817   MCHC 33.2 07/19/2018 0817   RDW 13.2 07/19/2018 0817   RDW 13.3 10/26/2017 1038   LYMPHSABS 0.9 07/19/2018 0817   LYMPHSABS 0.5 (L) 10/26/2017 1038   MONOABS 0.3 07/19/2018 0817   MONOABS 0.1 10/26/2017 1038   EOSABS 0.0 07/19/2018 0817   EOSABS 0.0 10/26/2017 1038   BASOSABS 0.0 07/19/2018 0817   BASOSABS 0.0 10/26/2017 1038   CMP Latest Ref Rng & Units 06/22/2018 05/25/2018 04/29/2018  Glucose 70 - 99 mg/dL 101(H) 108(H) 97  BUN 6 - 20 mg/dL 12 13 10   Creatinine 0.44 - 1.00 mg/dL 1.01(H) 0.97 1.06  Sodium 135 - 145 mmol/L 140 140 139  Potassium 3.5 - 5.1 mmol/L 4.2 4.0 3.9  Chloride 98 - 111 mmol/L 103 104 103  CO2 22 - 32 mmol/L 28 26 27   Calcium 8.9 - 10.3 mg/dL 9.8 9.6 9.3  Total Protein 6.5 - 8.1 g/dL 6.8 6.4(L) 6.8  Total Bilirubin 0.3 - 1.2 mg/dL <0.2(L) 0.2(L) <0.2(L)  Alkaline Phos 38 - 126 U/L 67 54 57  AST 15 - 41 U/L 16 20 22   ALT 0 - 44 U/L 15 17 19      STUDIES: Ct Chest W Contrast  Result Date: 07/01/2018 CLINICAL DATA:  57 year old female with history of left-sided breast cancer with metastatic disease, originally diagnosed in 2016. Ongoing chemotherapy. Radiation therapy complete. EXAM: CT CHEST WITH CONTRAST TECHNIQUE: Multidetector CT imaging of the chest was performed during intravenous contrast administration. CONTRAST:  59m ISOVUE-300 IOPAMIDOL (ISOVUE-300) INJECTION 61% COMPARISON:  Multiple priors, most recently chest CT 05/04/2018. FINDINGS: Cardiovascular: Heart size is normal. There is no significant pericardial fluid, thickening or pericardial calcification. Right subclavian single-lumen porta cath with tip terminating at the superior cavoatrial junction. No atherosclerotic  calcifications are noted in the thoracic aorta or or coronary arteries. Separate origin of the left vertebral artery directly off the arch (normal anatomical variant) incidentally noted. Mediastinum/Nodes: Prominent borderline enlarged low right paratracheal lymph node measuring 13 mm in short axis is stable to slightly decreased in size compared to the prior study from 05/04/2018. No other pathologically enlarged mediastinal or hilar lymph nodes. No internal mammary lymphadenopathy. Esophagus is unremarkable in appearance. No axillary lymphadenopathy. Lungs/Pleura: No suspicious appearing pulmonary nodules or masses. Areas of mild linear scarring are noted in the lungs bilaterally. No acute consolidative airspace disease. No pleural effusions. Upper Abdomen: Multiple subcentimeter low-attenuation lesions scattered throughout the liver appear similar in size, number and distribution to numerous prior examinations, incompletely characterized on today's noncontrast CT examination, but statistically likely to represent benign cysts or biliary hamartomas. Musculoskeletal: Status post left modified radical mastectomy with left subpectoral breast implant. Several sclerotic osseous lesions are noted, largest of which occupies much of the T9 vertebral body, similar to prior examinations, compatible with known metastatic disease to the bones. No definite new osseous lesions are noted. IMPRESSION: 1. No definite findings of metastatic disease in the thorax. Previously noted borderline enlarged low right paratracheal lymph node is stable to slightly decreased in size compared to the prior examination. Statistically, this would be a highly unusual solitary nodal metastasis in a patient with breast cancer, and is presumed to be benign. Attention on follow-up studies is recommended to ensure stability or regression of this lesion. 2. Additional incidental findings, as above. Electronically Signed   By:  Vinnie Langton M.D.   On:  07/01/2018 11:18    ELIGIBLE FOR AVAILABLE RESEARCH PROTOCOL: no  ASSESSMENT: 57 y.o. [place] woman with stage IV left-sided breast cancer involving bone and central nervous system  (1) s/p left breast lower inner quadrant biopsy 06/19/2015 for a clinical T2-3 NX invasive ductal carcinoma, grade 2, triple positive.  (2) status post left mastectomy and axillary lymph node dissection  with immediate expander placement 07/18/2015 for an mpT4 pN2,stage IIIB invasive ductal carcinoma, grade 3, with negative margins.  (a) definitive implant exchange to be scheduled in December   METASTATIC DISEASE: October 2016  (3) CT scan of the chest abdomen and pelvis  09/16/2015 shows metastatic lesions in the right scapula, left iliac crest, L4, and T spine. There were questionable liver cysts, with repeat CT scan 03/02/2016 showing possible right upper lobe lung lesions and possibly increased liver lesions  (a) CT scan of the chest 06/17/2016 shows no active disease in the lungs or liver  (b) Bone scan July 2017 showed no evidence of bony metastatic disease   (c) head CT 07/08/2016 showed a cerebellar lesion, confirmed by MRI 07/23/2016, status post craniotomy 08/14/2016, confirming a metastatic deposit which was estrogen and progesterone receptor negative, HER-2 amplified with a signals ratio of 7.16, number per cell 13.25  (d) CA 27-29 is not informative  (4) received docetaxel every 3 weeks 6 together with trastuzumab and pertuzumab, last docetaxel dose 02/11/2016  (5) adjuvant radiation7/03/2016 to 06/26/2016 at [facility]: 1. The Left chest wall was treated to 23.4 Gy in 13 fractions at 1.8 Gy per fraction. 2. The Left chest wall was boosted to 10 Gy in 5 fractions at 2 Gy per fraction. 3. The Left Sclav/PAB was treated to 23.4 Gy in 13 fractions at 1.8 Gy per fraction.  [Note: Including the patient's treatment in [place] (received 15 fractions per Dr. Maryan Rued near [place], [place]), the patient  received 50.4 Gy to the left chest wall and supraclavicular region. ]  (6) started trastuzumab and pertuzumab October 2016, continuing every 3 weeks,  (a) echocardiogram 02/26/2016 showed a well preserved ejection fraction  (b) echocardiogram 07/01/2016 shows an ejection fraction in the 60-65%   (c) pertuzumab discontinued 10/2016 with uncontrolled diarrhea  (d) echocardiogram 11/11/2016 showed an ejection fraction in the 60-65%  (e) echocardiogram 03/03/2017 shows an ejection fraction of 60-65%  (f) echocardiogram on 05/19/2017 shows an ejection fraction of 55-60%  (g) echocardiogram 09/24/2017 shows the ejection fraction in the 60-65%  (h) echocardiogram 02/14/2018 shows an ejection fraction in the 60-65%  (I) echocardiogram  06/30/2018 shows an ejection fraction in the 55-60%    (7) started denosumab/Xgeva October 2017, repeated every 4 weeks  (8) started anastrozole October 2017   (a) bone scan 11/10/2016 shows no active disease  (b) chest CT scan 11/10/2016 stable, with no evidence of active disease  (c) chest CT and bone scan 07/02/2017 show no evidence of active disease  (d) CT scan of the chest with contrast 11/10/2017 shows some left axillary edema, but no evidence of thrombosis or adenopathy in that area, 0.9 cm precarinal lymph and 0.7 cm right upper lobe nodule node; bone lesions stable  (e) CT of the chest 05/04/2018 shows a 1.4 cm right lower paratracheal node which is slightly increased and a new right prevascular mediastinal node measuring 0.7 cm.  Bone lesions are stable.  (f) chest CT on 07/01/2018 shows no definite findings of metastatic disease in the thorax. 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  (a) brain irradiation10/19/17-11/08/17: Whole brain/ 35 Gy in 14 fractions   (b) repeat brain MRI obtained 12/10/2016 shows no active disease in the brain  (c) repeat brain MRI 03/02/2017 shows no evidence of residual or recurrent disease  (d) repeat brain MRI 07/29/2017 shows no evidence of residual or recurrent disease  (e) repeat brain MRI 12/07/2017 shows no evidence of disease recurrence.  There is progressive white matter change secondary to prior treatment.  (f) repeat brain MRI 04/07/2018 showed no evidence of disease   PLAN: I spent approximately 30 minutes face to face with Aleta with more than 50% of that time spent in counseling and coordination of care. Cami is now just about 3 years out from definitive diagnosis of metastatic breast cancer, with no evidence of disease activity.  This is very favorable.  I suggested we do not need to repeat a CT of the chest in 3 months.  We will do it again in 6.  She was a little anxious about this but we discussed the fact that she does get quite a bit of radiation every time she has a CT of the chest and she is agreeable to waiting until February.  She will be seeing Dr. Mickeal Skinner in September and before that visit we have ordered a repeat brain MRI  I have written her for Rogaine to see if that helps the hair loss.  She will be due for repeat right mammography January 2020  She will have been on [place] monthly 2 years as of October.  We can consider going to every 8 weeks after that  We are of course continuing the trastuzumab monthly indefinitely.  She will be due for her next echocardiogram 6 months  She knows to call for any other issues that may develop before the next visit.      Chele Cornell, Virgie Dad, MD  07/19/18 8:48  AM Medical Oncology and Hematology [facility] [address] [place], [place] [address] Tel. [phone]    Fax. [phone]  Alice Rieger, am acting as scribe for Chauncey Cruel MD.  I, Lurline Del MD, have reviewed the above documentation for accuracy and completeness, and I agree with the above.

## 2018-07-19 ENCOUNTER — Inpatient Hospital Stay: Payer: Medicaid Other

## 2018-07-19 ENCOUNTER — Inpatient Hospital Stay: Payer: Medicaid Other | Attending: Medical | Admitting: Oncology

## 2018-07-19 ENCOUNTER — Other Ambulatory Visit: Payer: Self-pay

## 2018-07-19 ENCOUNTER — Ambulatory Visit: Payer: Self-pay

## 2018-07-19 ENCOUNTER — Telehealth: Payer: Self-pay | Admitting: Adult Health

## 2018-07-19 VITALS — BP 123/70 | HR 80 | Temp 98.6°F | Resp 18 | Ht 65.0 in | Wt 167.0 lb

## 2018-07-19 DIAGNOSIS — Z9012 Acquired absence of left breast and nipple: Secondary | ICD-10-CM

## 2018-07-19 DIAGNOSIS — C7951 Secondary malignant neoplasm of bone: Secondary | ICD-10-CM | POA: Diagnosis not present

## 2018-07-19 DIAGNOSIS — C50312 Malignant neoplasm of lower-inner quadrant of left female breast: Secondary | ICD-10-CM | POA: Insufficient documentation

## 2018-07-19 DIAGNOSIS — L659 Nonscarring hair loss, unspecified: Secondary | ICD-10-CM

## 2018-07-19 DIAGNOSIS — Z5112 Encounter for antineoplastic immunotherapy: Secondary | ICD-10-CM | POA: Diagnosis present

## 2018-07-19 DIAGNOSIS — C7931 Secondary malignant neoplasm of brain: Secondary | ICD-10-CM | POA: Insufficient documentation

## 2018-07-19 DIAGNOSIS — C50919 Malignant neoplasm of unspecified site of unspecified female breast: Secondary | ICD-10-CM

## 2018-07-19 DIAGNOSIS — Z170 Estrogen receptor positive status [ER+]: Secondary | ICD-10-CM

## 2018-07-19 DIAGNOSIS — Z801 Family history of malignant neoplasm of trachea, bronchus and lung: Secondary | ICD-10-CM

## 2018-07-19 DIAGNOSIS — Z79811 Long term (current) use of aromatase inhibitors: Secondary | ICD-10-CM

## 2018-07-19 LAB — COMPREHENSIVE METABOLIC PANEL
ALT: 13 U/L (ref 0–44)
AST: 15 U/L (ref 15–41)
Albumin: 3.9 g/dL (ref 3.5–5.0)
Alkaline Phosphatase: 71 U/L (ref 38–126)
Anion gap: 10 (ref 5–15)
BUN: 9 mg/dL (ref 6–20)
CO2: 27 mmol/L (ref 22–32)
Calcium: 9.4 mg/dL (ref 8.9–10.3)
Chloride: 104 mmol/L (ref 98–111)
Creatinine, Ser: 0.96 mg/dL (ref 0.44–1.00)
GFR calc Af Amer: >60 mL/min (ref >60)
GFR calc non Af Amer: >60 mL/min (ref >60)
Glucose, Bld: 101 mg/dL — ABNORMAL HIGH (ref 70–99)
Potassium: 4.3 mmol/L (ref 3.5–5.1)
Sodium: 141 mmol/L (ref 135–145)
Total Bilirubin: 0.3 mg/dL (ref 0.3–1.2)
Total Protein: 6.9 g/dL (ref 6.5–8.1)

## 2018-07-19 LAB — CBC WITH DIFFERENTIAL/PLATELET
Basophils Absolute: 0 10*3/uL (ref 0.0–0.1)
Basophils Relative: 0 %
Eosinophils Absolute: 0 10*3/uL (ref 0.0–0.5)
Eosinophils Relative: 1 %
HCT: 38.2 % (ref 34.8–46.6)
Hemoglobin: 12.7 g/dL (ref 11.6–15.9)
Lymphocytes Relative: 22 %
Lymphs Abs: 0.9 10*3/uL (ref 0.9–3.3)
MCH: 31.3 pg (ref 25.1–34.0)
MCHC: 33.2 g/dL (ref 31.5–36.0)
MCV: 94.1 fL (ref 79.5–101.0)
Monocytes Absolute: 0.3 10*3/uL (ref 0.1–0.9)
Monocytes Relative: 7 %
Neutro Abs: 2.9 10*3/uL (ref 1.5–6.5)
Neutrophils Relative %: 70 %
Platelets: 177 10*3/uL (ref 145–400)
RBC: 4.06 MIL/uL (ref 3.70–5.45)
RDW: 13.2 % (ref 11.2–14.5)
WBC: 4.2 10*3/uL (ref 3.9–10.3)

## 2018-07-19 MED ORDER — SODIUM CHLORIDE 0.9 % IV SOLN
Freq: Once | INTRAVENOUS | Status: AC
  Administered 2018-07-19: 10:00:00 via INTRAVENOUS
  Filled 2018-07-19: qty 250

## 2018-07-19 MED ORDER — DENOSUMAB 120 MG/1.7ML ~~LOC~~ SOLN
120.0000 mg | Freq: Once | SUBCUTANEOUS | Status: AC
  Administered 2018-07-19: 120 mg via SUBCUTANEOUS

## 2018-07-19 MED ORDER — TRASTUZUMAB CHEMO 150 MG IV SOLR
450.0000 mg | Freq: Once | INTRAVENOUS | Status: AC
  Administered 2018-07-19: 450 mg via INTRAVENOUS
  Filled 2018-07-19: qty 21.43

## 2018-07-19 MED ORDER — HEPARIN SOD (PORK) LOCK FLUSH 100 UNIT/ML IV SOLN
500.0000 [IU] | Freq: Once | INTRAVENOUS | Status: AC | PRN
  Administered 2018-07-19: 500 [IU]
  Filled 2018-07-19: qty 5

## 2018-07-19 MED ORDER — DENOSUMAB 120 MG/1.7ML ~~LOC~~ SOLN
SUBCUTANEOUS | Status: AC
  Filled 2018-07-19: qty 1.7

## 2018-07-19 MED ORDER — ACETAMINOPHEN 325 MG PO TABS: 650.0000 mg | ORAL_TABLET | Freq: Once | ORAL | Status: DC

## 2018-07-19 MED ORDER — DIPHENHYDRAMINE HCL 25 MG PO CAPS: 25.0000 mg | ORAL_CAPSULE | Freq: Once | ORAL | Status: DC

## 2018-07-19 MED ORDER — ACETAMINOPHEN 325 MG PO TABS
ORAL_TABLET | ORAL | Status: AC
  Filled 2018-07-19: qty 2

## 2018-07-19 MED ORDER — MINOXIDIL 2 % EX SOLN: Freq: Two times a day (BID) | CUTANEOUS | 0 refills | Status: DC

## 2018-07-19 MED ORDER — SODIUM CHLORIDE 0.9% FLUSH
10.0000 mL | INTRAVENOUS | Status: DC | PRN
  Administered 2018-07-19: 10 mL
  Filled 2018-07-19: qty 10

## 2018-07-19 MED ORDER — DIPHENHYDRAMINE HCL 25 MG PO CAPS
ORAL_CAPSULE | ORAL | Status: AC
  Filled 2018-07-19: qty 1

## 2018-07-19 NOTE — Patient Instructions (Signed)
[facility] Discharge Instructions for Patients Receiving Chemotherapy  Today you received the following chemotherapy agents Herceptin   To help prevent nausea and vomiting after your treatment, we encourage you to take your nausea medication as directed.    If you develop nausea and vomiting that is not controlled by your nausea medication, call the clinic.   BELOW ARE SYMPTOMS THAT SHOULD BE REPORTED IMMEDIATELY:  *FEVER GREATER THAN 100.5 F  *CHILLS WITH OR WITHOUT FEVER  NAUSEA AND VOMITING THAT IS NOT CONTROLLED WITH YOUR NAUSEA MEDICATION  *UNUSUAL SHORTNESS OF BREATH  *UNUSUAL BRUISING OR BLEEDING  TENDERNESS IN MOUTH AND THROAT WITH OR WITHOUT PRESENCE OF ULCERS  *URINARY PROBLEMS  *BOWEL PROBLEMS  UNUSUAL RASH Items with * indicate a potential emergency and should be followed up as soon as possible.  Feel free to call the clinic should you have any questions or concerns. The clinic phone number is [phone].  Please show the [facility] at check-in to the Emergency Department and triage nurse.  Denosumab injection What is this medicine? DENOSUMAB (den oh sue mab) slows bone breakdown. Prolia is used to treat osteoporosis in women after menopause and in men. Delton See is used to treat a high calcium level due to cancer and to prevent bone fractures and other bone problems caused by multiple myeloma or cancer bone metastases. Delton See is also used to treat giant cell tumor of the bone. This medicine may be used for other purposes; ask your health care provider or pharmacist if you have questions. COMMON BRAND NAME(S): Prolia, XGEVA What should I tell my health care provider before I take this medicine? They need to know if you have any of these conditions: -dental disease -having surgery or tooth extraction -infection -kidney disease -low levels of calcium or Vitamin D in the blood -malnutrition -on hemodialysis -skin conditions or  sensitivity -thyroid or parathyroid disease -an unusual reaction to denosumab, other medicines, foods, dyes, or preservatives -pregnant or trying to get pregnant -breast-feeding How should I use this medicine? This medicine is for injection under the skin. It is given by a health care professional in a hospital or clinic setting. If you are getting Prolia, a special MedGuide will be given to you by the pharmacist with each prescription and refill. Be sure to read this information carefully each time. For Prolia, talk to your pediatrician regarding the use of this medicine in children. Special care may be needed. For Delton See, talk to your pediatrician regarding the use of this medicine in children. While this drug may be prescribed for children as young as 13 years for selected conditions, precautions do apply. Overdosage: If you think you have taken too much of this medicine contact a poison control center or emergency room at once. NOTE: This medicine is only for you. Do not share this medicine with others. What if I miss a dose? It is important not to miss your dose. Call your doctor or health care professional if you are unable to keep an appointment. What may interact with this medicine? Do not take this medicine with any of the following medications: -other medicines containing denosumab This medicine may also interact with the following medications: -medicines that lower your chance of fighting infection -steroid medicines like prednisone or cortisone This list may not describe all possible interactions. Give your health care provider a list of all the medicines, herbs, non-prescription drugs, or dietary supplements you use. Also tell them if you smoke, drink alcohol, or  use illegal drugs. Some items may interact with your medicine. What should I watch for while using this medicine? Visit your doctor or health care professional for regular checks on your progress. Your doctor or health care  professional may order blood tests and other tests to see how you are doing. Call your doctor or health care professional for advice if you get a fever, chills or sore throat, or other symptoms of a cold or flu. Do not treat yourself. This drug may decrease your body's ability to fight infection. Try to avoid being around people who are sick. You should make sure you get enough calcium and vitamin D while you are taking this medicine, unless your doctor tells you not to. Discuss the foods you eat and the vitamins you take with your health care professional. See your dentist regularly. Brush and floss your teeth as directed. Before you have any dental work done, tell your dentist you are receiving this medicine. Do not become pregnant while taking this medicine or for 5 months after stopping it. Talk with your doctor or health care professional about your birth control options while taking this medicine. Women should inform their doctor if they wish to become pregnant or think they might be pregnant. There is a potential for serious side effects to an unborn child. Talk to your health care professional or pharmacist for more information. What side effects may I notice from receiving this medicine? Side effects that you should report to your doctor or health care professional as soon as possible: -allergic reactions like skin rash, itching or hives, swelling of the face, lips, or tongue -bone pain -breathing problems -dizziness -jaw pain, especially after dental work -redness, blistering, peeling of the skin -signs and symptoms of infection like fever or chills; cough; sore throat; pain or trouble passing urine -signs of low calcium like fast heartbeat, muscle cramps or muscle pain; pain, tingling, numbness in the hands or feet; seizures -unusual bleeding or bruising -unusually weak or tired Side effects that usually do not require medical attention (report to your doctor or health care professional  if they continue or are bothersome): -constipation -diarrhea -headache -joint pain -loss of appetite -muscle pain -runny nose -tiredness -upset stomach This list may not describe all possible side effects. Call your doctor for medical advice about side effects. You may report side effects to FDA at 1-800-FDA-1088. Where should I keep my medicine? This medicine is only given in a clinic, doctor's office, or other health care setting and will not be stored at home. NOTE: This sheet is a summary. It may not cover all possible information. If you have questions about this medicine, talk to your doctor, pharmacist, or health care provider.  2018 Elsevier/Gold Standard (2016-12-08 19:17:21)

## 2018-07-19 NOTE — Telephone Encounter (Signed)
Gave avs and calendar ° °

## 2018-07-20 ENCOUNTER — Encounter: Payer: Self-pay | Admitting: Gastroenterology

## 2018-07-20 ENCOUNTER — Ambulatory Visit (INDEPENDENT_AMBULATORY_CARE_PROVIDER_SITE_OTHER): Payer: Medicaid Other | Admitting: Gastroenterology

## 2018-07-20 VITALS — BP 118/66 | HR 70 | Ht 65.0 in | Wt 167.2 lb

## 2018-07-20 DIAGNOSIS — Z1211 Encounter for screening for malignant neoplasm of colon: Secondary | ICD-10-CM

## 2018-07-20 LAB — CANCER ANTIGEN 27.29: CA 27.29: 9.2 U/mL (ref 0.0–38.6)

## 2018-07-20 MED ORDER — NA SULFATE-K SULFATE-MG SULF 17.5-3.13-1.6 GM/177ML PO SOLN: 1.0000 | ORAL | 0 refills | Status: DC

## 2018-07-20 NOTE — Progress Notes (Signed)
Agree with assessment and plan as outlined.  

## 2018-07-20 NOTE — Progress Notes (Signed)
07/20/2018 [name] [id number] Oct 09, 1961   HISTORY OF PRESENT ILLNESS:  This is a pleasant 57 year old female with metastatic breast cancer.  She is under the care of Dr. [name] and is doing well, in remission.  Anyway, she is here today to discuss screening colonoscopy.  Labs from yesterday look good.  Reports constipation that has been present for a while and she thinks is likely due to her medications.  She is using Dulcolax daily and using Miralax every 2-3 days currently.  No sign of rectal bleeding.   Past Medical History:  Diagnosis Date  . Alcohol abuse   . Anemia    during chemo  . Anxiety    At age 27  . Arthritis Dx 2010  . Bipolar disorder ([place])   . Bronchitis   . Cancer ([place])    breast mets to brain  . Chronic pain   . Complication of anesthesia   . Depression   . Family history of adverse reaction to anesthesia    parents had PONV  . Fibromyalgia Dx 2005  . GERD (gastroesophageal reflux disease)   . Headache    hx  migraines  . Lymphedema of left arm   . Opiate dependence ([place])   . PONV (postoperative nausea and vomiting)   . Port-A-Cath in place   . PTSD (post-traumatic stress disorder)    Past Surgical History:  Procedure Laterality Date  . APPLICATION OF CRANIAL NAVIGATION N/A 08/14/2016   Procedure: APPLICATION OF CRANIAL NAVIGATION;  Surgeon: Erline Levine, MD;  Location: [facility];  Service: Neurosurgery;  Laterality: N/A;  . BREAST RECONSTRUCTION Left    with silicone implant  . CRANIOTOMY N/A 08/14/2016   Procedure: CRANIOTOMY TUMOR EXCISION WITH Lucky Rathke;  Surgeon: Erline Levine, MD;  Location: [facility];  Service: Neurosurgery;  Laterality: N/A;  . FIBULA FRACTURE SURGERY Left   . MASTECTOMY Left   . RADIOLOGY WITH ANESTHESIA N/A 07/23/2016   Procedure: MRI OF BRAIN WITH AND WITHOUT;  Surgeon: Medication Radiologist, MD;  Location: [facility];  Service: Radiology;  Laterality: N/A;  . RADIOLOGY WITH ANESTHESIA N/A 09/08/2016   Procedure: MRI OF BRAIN WITH AND WITHOUT CONTRAST;  Surgeon: Medication Radiologist, MD;  Location: [facility];  Service: Radiology;  Laterality: N/A;  . RADIOLOGY WITH ANESTHESIA N/A 12/10/2016   Procedure: MRI OF BRAIN WITH AND WITHOUT;  Surgeon: Medication Radiologist, MD;  Location: [facility];  Service: Radiology;  Laterality: N/A;  . RADIOLOGY WITH ANESTHESIA N/A 03/02/2017   Procedure: MRI of BRAIN W and W/OUT CONTRAST;  Surgeon: Medication Radiologist, MD;  Location: [facility];  Service: Radiology;  Laterality: N/A;  . RADIOLOGY WITH ANESTHESIA N/A 07/29/2017   Procedure: RADIOLOGY WITH ANESTHESIA MRI OF BRAIN WITH AND WITHOUT CONTRAST;  Surgeon: Radiologist, Medication, MD;  Location: [facility];  Service: Radiology;  Laterality: N/A;  . RADIOLOGY WITH ANESTHESIA N/A 12/07/2017   Procedure: MRI WITH ANESTHESIA OF BRAIN WITH AND WITHOUT CONTRAST;  Surgeon: Radiologist, Medication, MD;  Location: [facility];  Service: Radiology;  Laterality: N/A;  . RADIOLOGY WITH ANESTHESIA N/A 04/07/2018   Procedure: MRI OF BRAIN WITH AND WITHOUT CONTRAST;  Surgeon: Radiologist, Medication, MD;  Location: [facility];  Service: Radiology;  Laterality: N/A;  . right power port placement Right     reports that she has been smoking cigarettes. She has been smoking about 1.00 pack per day. She has never used smokeless tobacco. She reports that she does not drink alcohol or use drugs.  family history includes Bipolar disorder in her mother; CAD in her father; Diabetes in her mother. Allergies  Allergen Reactions  . Demerol Itching and Nausea And Vomiting  . Erythromycin Rash      Outpatient Encounter Medications as of 07/20/2018  Medication Sig  . amoxicillin (AMOXIL) 500 MG capsule Take 2 capsules (1,000 mg total) by mouth 2 (two) times daily.  [name] anastrozole (ARIMIDEX) 1 MG tablet TAKE 1 TABLET BY MOUTH EVERY DAY  . Biotin 1000 MCG tablet Take 1,000 mcg by mouth daily.  . Cholecalciferol (VITAMIN D3) 5000 units CAPS Take 5,000 Units by  mouth daily.  . cyclobenzaprine (FLEXERIL) 5 MG tablet TAKE 2 TABLET BY MOUTH TWICE A DAY AS NEEDED FOR MUSCLE SPASMS  . cycloSPORINE (RESTASIS) 0.05 % ophthalmic emulsion Place 1 drop into both eyes 2 (two) times daily.  [name] docusate sodium (COLACE) 100 MG capsule Take 100 mg by mouth daily as needed for mild constipation.   . fluticasone (FLONASE) 50 MCG/ACT nasal spray SPRAY 2 SPRAYS INTO EACH NOSTRIL EVERY DAY  . gabapentin (NEURONTIN) 300 MG capsule Take 1 capsule (300 mg total) by mouth 2 (two) times daily. (Patient taking differently: Take 300-600 mg by mouth See admin instructions. Take 300 mg by mouth in the morning and take 600 mg by mouth at bedtime)  . hydrOXYzine (ATARAX/VISTARIL) 25 MG tablet Take 1 tablet (25 mg total) by mouth 3 (three) times daily as needed. Pt may take 1-2 tablets 3 times daily as needed  . ibuprofen (ADVIL,MOTRIN) 800 MG tablet TAKE 1 TABLET BY MOUTH THREE TIMES A DAY  . lamoTRIgine (LAMICTAL) 25 MG tablet Take 1 tablet (25 mg total) by mouth 2 (two) times daily. (Patient taking differently: Take 75 mg by mouth every morning. )  . loratadine (CLARITIN) 10 MG tablet TAKE 1 TABLET BY MOUTH EVERY DAY  . lurasidone (LATUDA) 40 MG TABS tablet Take 1 tablet (40 mg total) by mouth daily with breakfast.  . ondansetron (ZOFRAN) 8 MG tablet Take 1 tablet (8 mg total) by mouth every 8 (eight) hours as needed for nausea or vomiting.  . pantoprazole (PROTONIX) 40 MG tablet Take 1 tablet (40 mg total) by mouth daily.  . polyethylene glycol (MIRALAX / GLYCOLAX) packet TAKE 17 G BY MOUTH DAILY AS NEEDED FOR MILD CONSTIPATION.  . traZODone (DESYREL) 100 MG tablet Take 1 tablet (100 mg total) by mouth at bedtime. (Patient taking differently: Take 200 mg by mouth at bedtime. )  . varenicline (CHANTIX) 0.5 MG tablet Take 0.5 mg by mouth daily.  . [DISCONTINUED] anastrozole (ARIMIDEX) 1 MG tablet Take 1 tablet (1 mg total) daily by mouth.  . [DISCONTINUED] benzonatate (TESSALON) 100 MG  capsule Take 2 capsules (200 mg total) by mouth 3 (three) times daily as needed for cough.  . [DISCONTINUED] loratadine (CLARITIN) 10 MG tablet Take 10 mg by mouth daily.  . [DISCONTINUED] minoxidil (ROGAINE) 2 % external solution Apply topically 2 (two) times daily.   No facility-administered encounter medications on file as of 07/20/2018.      REVIEW OF SYSTEMS  : All other systems reviewed and negative except where noted in the History of Present Illness.   PHYSICAL EXAM: BP 118/66   Pulse 70   Ht 5\' 5"  (1.651 m)   Wt 167 lb 3.2 oz (75.8 kg)   BMI 27.82 kg/m  General: Well developed white female in no acute distress Head: Normocephalic and atraumatic Eyes:  Sclerae anicteric, conjunctiva pink. Ears: Normal auditory acuity Lungs:  Clear throughout to auscultation; no increased WOB. Heart: Regular rate and rhythm; no M/R/G. Abdomen: Soft, non-distended.  BS present.  Non-tender. Rectal:  Will be done at the time of colonoscopy. Musculoskeletal: Symmetrical with no gross deformities  Skin: No lesions on visible extremities Extremities:  Left arm wrapped due to lymphedema.   Neurological: Alert oriented x 4, grossly non-focal Psychological:  Alert and cooperative. Normal mood and affect  ASSESSMENT AND PLAN: *Screening for CRC:  Doing well from oncology standpoint, in remission from breast cancer.  Ok to proceed with colonoscopy.  Will schedule with Dr. Havery Moros. *Chronic constipation:  Using Miralax a couple of times per week and then using Dulcolax regularly.  I have asked her to switch and begin using Miralax daily or at least every other day and can increase if needed and to decrease use of Dulcolax.  **The risks, benefits, and alternatives to colonoscopy were discussed with the patient and she consents to proceed.   CC:  Charlott Rakes, MD

## 2018-07-20 NOTE — Patient Instructions (Addendum)
You have been scheduled for a colonoscopy. Please follow written instructions given to you at your visit today.  Please pick up your prep supplies at the pharmacy within the next 1-3 days. If you use inhalers (even only as needed), please bring them with you on the day of your procedure. Your physician has requested that you go to www.startemmi.com and enter the access code given to you at your visit today. This web site gives a general overview about your procedure. However, you should still follow specific instructions given to you by our office regarding your preparation for the procedure.  As discussed begin miralax 17 grams in 8 oz of water or juice daily or every other day.

## 2018-07-22 ENCOUNTER — Ambulatory Visit: Payer: Self-pay

## 2018-07-22 ENCOUNTER — Other Ambulatory Visit: Payer: Self-pay

## 2018-07-26 ENCOUNTER — Ambulatory Visit (AMBULATORY_SURGERY_CENTER): Payer: Medicaid Other | Admitting: Gastroenterology

## 2018-07-26 ENCOUNTER — Encounter: Payer: Self-pay | Admitting: Gastroenterology

## 2018-07-26 VITALS — BP 134/70 | HR 80 | Temp 98.4°F | Resp 17 | Ht 65.0 in | Wt 167.0 lb

## 2018-07-26 DIAGNOSIS — K635 Polyp of colon: Secondary | ICD-10-CM | POA: Diagnosis not present

## 2018-07-26 DIAGNOSIS — Z1211 Encounter for screening for malignant neoplasm of colon: Secondary | ICD-10-CM | POA: Diagnosis not present

## 2018-07-26 DIAGNOSIS — D122 Benign neoplasm of ascending colon: Secondary | ICD-10-CM | POA: Diagnosis not present

## 2018-07-26 DIAGNOSIS — D120 Benign neoplasm of cecum: Secondary | ICD-10-CM | POA: Diagnosis not present

## 2018-07-26 MED ORDER — SODIUM CHLORIDE 0.9 % IV SOLN: 500.0000 mL | Freq: Once | INTRAVENOUS | Status: DC

## 2018-07-26 NOTE — Progress Notes (Signed)
Report given to PACU, vss 

## 2018-07-26 NOTE — Op Note (Signed)
[facility] Patient Name: [name] Procedure Date: 07/26/2018 8:15 AM MRN: [id number] Endoscopist: Remo Lipps P. Havery Moros , MD Age: 57 Referring MD:  Date of Birth: 01-14-1961 Gender: Female Account #: [id number] Procedure:                Colonoscopy Indications:              Screening for colorectal malignant neoplasm, This                            is the patient's first colonoscopy Medicines:                Monitored Anesthesia Care Procedure:                Pre-Anesthesia Assessment:                           - Prior to the procedure, a History and Physical                            was performed, and patient medications and                            allergies were reviewed. The patient's tolerance of                            previous anesthesia was also reviewed. The risks                            and benefits of the procedure and the sedation                            options and risks were discussed with the patient.                            All questions were answered, and informed consent                            was obtained. Prior Anticoagulants: The patient has                            taken no previous anticoagulant or antiplatelet                            agents. ASA Grade Assessment: II - A patient with                            mild systemic disease. After reviewing the risks                            and benefits, the patient was deemed in                            satisfactory condition to undergo the procedure.  After obtaining informed consent, the colonoscope                            was passed under direct vision. Throughout the                            procedure, the patient's blood pressure, pulse, and                            oxygen saturations were monitored continuously. The                            Colonoscope was introduced through the anus and                            advanced to the the  terminal ileum, with                            identification of the appendiceal orifice and IC                            valve. The colonoscopy was performed without                            difficulty. The patient tolerated the procedure                            well. The quality of the bowel preparation was                            good. The terminal ileum, ileocecal valve,                            appendiceal orifice, and rectum were photographed. Scope In: 8:26:01 AM Scope Out: 8:48:01 AM Scope Withdrawal Time: 0 hours 16 minutes 21 seconds  Total Procedure Duration: 0 hours 22 minutes 0 seconds  Findings:                 The perianal and digital rectal examinations were                            normal.                           The terminal ileum appeared normal.                           A 4 mm polyp was found in the cecum. The polyp was                            sessile. The polyp was removed with a cold snare.                            Resection and retrieval were complete.  A 5 mm polyp was found in the ascending colon. The                            polyp was sessile. The polyp was removed with a                            cold snare. Resection and retrieval were complete.                           Multiple small-mouthed diverticula were found in                            the left colon.                           The colon was tortuous.                           The exam was otherwise without abnormality on                            direct and retroflexion views. Complications:            No immediate complications. Estimated blood loss:                            Minimal. Estimated Blood Loss:     Estimated blood loss was minimal. Impression:               - The examined portion of the ileum was normal.                           - One 4 mm polyp in the cecum, removed with a cold                            snare. Resected and  retrieved.                           - One 5 mm polyp in the ascending colon, removed                            with a cold snare. Resected and retrieved.                           - Diverticulosis in the left colon.                           - Tortuous colon.                           - The examination was otherwise normal on direct                            and retroflexion views. Recommendation:           - Patient has a contact  number available for                            emergencies. The signs and symptoms of potential                            delayed complications were discussed with the                            patient. Return to normal activities tomorrow.                            Written discharge instructions were provided to the                            patient.                           - Resume previous diet.                           - Continue present medications.                           - Await pathology results.                           - Repeat colonoscopy for surveillance based on                            pathology results. Remo Lipps P. Tanay Misuraca, MD 07/26/2018 8:52:03 AM This report has been signed electronically.

## 2018-07-26 NOTE — Patient Instructions (Signed)
**   Handouts given on polyps and diverticulosis **   YOU HAD AN ENDOSCOPIC PROCEDURE TODAY AT THE [facility]:   Refer to the procedure report that was given to you for any specific questions about what was found during the examination.  If the procedure report does not answer your questions, please call your gastroenterologist to clarify.  If you requested that your care partner not be given the details of your procedure findings, then the procedure report has been included in a sealed envelope for you to review at your convenience later.  YOU SHOULD EXPECT: Some feelings of bloating in the abdomen. Passage of more gas than usual.  Walking can help get rid of the air that was put into your GI tract during the procedure and reduce the bloating. If you had a lower endoscopy (such as a colonoscopy or flexible sigmoidoscopy) you may notice spotting of blood in your stool or on the toilet paper. If you underwent a bowel prep for your procedure, you may not have a normal bowel movement for a few days.  Please Note:  You might notice some irritation and congestion in your nose or some drainage.  This is from the oxygen used during your procedure.  There is no need for concern and it should clear up in a day or so.  SYMPTOMS TO REPORT IMMEDIATELY:   Following lower endoscopy (colonoscopy or flexible sigmoidoscopy):  Excessive amounts of blood in the stool  Significant tenderness or worsening of abdominal pains  Swelling of the abdomen that is new, acute  Fever of 100F or higher  For urgent or emergent issues, a gastroenterologist can be reached at any hour by calling [phone].   DIET:  We do recommend a small meal at first, but then you may proceed to your regular diet.  Drink plenty of fluids but you should avoid alcoholic beverages for 24 hours.  ACTIVITY:  You should plan to take it easy for the rest of today and you should NOT DRIVE or use heavy machinery until tomorrow (because  of the sedation medicines used during the test).    FOLLOW UP: Our staff will call the number listed on your records the next business day following your procedure to check on you and address any questions or concerns that you may have regarding the information given to you following your procedure. If we do not reach you, we will leave a message.  However, if you are feeling well and you are not experiencing any problems, there is no need to return our call.  We will assume that you have returned to your regular daily activities without incident.  If any biopsies were taken you will be contacted by phone or by letter within the next 1-3 weeks.  Please call us at [phone] if you have not heard about the biopsies in 3 weeks.    SIGNATURES/CONFIDENTIALITY: You and/or your care partner have signed paperwork which will be entered into your electronic medical record.  These signatures attest to the fact that that the information above on your After Visit Summary has been reviewed and is understood.  Full responsibility of the confidentiality of this discharge information lies with you and/or your care-partner. 

## 2018-07-26 NOTE — Progress Notes (Signed)
Called to room to assist during endoscopic procedure.  Patient ID and intended procedure confirmed with present staff. Received instructions for my participation in the procedure from the performing physician.  

## 2018-07-27 ENCOUNTER — Telehealth: Payer: Self-pay | Admitting: *Deleted

## 2018-07-27 NOTE — Telephone Encounter (Signed)
  Follow up Call-  Call back number 07/26/2018  Post procedure Call Back phone  # [phone]  Permission to leave phone message Yes  Some recent data might be hidden     Patient questions:  Do you have a fever, pain , or abdominal swelling? No. Pain Score  0 *  Have you tolerated food without any problems? Yes.    Have you been able to return to your normal activities? Yes.    Do you have any questions about your discharge instructions: Diet   No. Medications  No. Follow up visit  No.  Do you have questions or concerns about your Care? No.  Actions: * If pain score is 4 or above: No action needed, pain <4.

## 2018-07-30 ENCOUNTER — Other Ambulatory Visit: Payer: Self-pay | Admitting: Family Medicine

## 2018-07-30 DIAGNOSIS — R519 Headache, unspecified: Secondary | ICD-10-CM

## 2018-07-30 DIAGNOSIS — M62838 Other muscle spasm: Secondary | ICD-10-CM

## 2018-07-30 DIAGNOSIS — G8929 Other chronic pain: Secondary | ICD-10-CM

## 2018-07-30 DIAGNOSIS — R51 Headache: Principal | ICD-10-CM

## 2018-08-03 ENCOUNTER — Encounter: Payer: Self-pay | Admitting: Gastroenterology

## 2018-08-10 ENCOUNTER — Other Ambulatory Visit: Payer: Self-pay | Admitting: Oncology

## 2018-08-10 ENCOUNTER — Telehealth: Payer: Self-pay | Admitting: Gastroenterology

## 2018-08-10 NOTE — Telephone Encounter (Signed)
Called patient back, got her vm so left a message that a letter was sent out, I will reprint it and send it to her. Recall in 5 years for colonoscopy.

## 2018-08-10 NOTE — Telephone Encounter (Signed)
Pt called looking for path results.

## 2018-08-15 ENCOUNTER — Inpatient Hospital Stay: Payer: Medicaid Other | Attending: Medical | Admitting: Adult Health

## 2018-08-15 ENCOUNTER — Telehealth: Payer: Self-pay | Admitting: Adult Health

## 2018-08-15 ENCOUNTER — Encounter: Payer: Self-pay | Admitting: Adult Health

## 2018-08-15 VITALS — BP 129/85 | HR 88 | Temp 98.1°F | Resp 18 | Ht 65.0 in | Wt 164.5 lb

## 2018-08-15 DIAGNOSIS — Z5112 Encounter for antineoplastic immunotherapy: Secondary | ICD-10-CM | POA: Diagnosis present

## 2018-08-15 DIAGNOSIS — Z170 Estrogen receptor positive status [ER+]: Secondary | ICD-10-CM | POA: Diagnosis not present

## 2018-08-15 DIAGNOSIS — C7951 Secondary malignant neoplasm of bone: Secondary | ICD-10-CM

## 2018-08-15 DIAGNOSIS — Z79811 Long term (current) use of aromatase inhibitors: Secondary | ICD-10-CM | POA: Diagnosis not present

## 2018-08-15 DIAGNOSIS — Z9012 Acquired absence of left breast and nipple: Secondary | ICD-10-CM | POA: Diagnosis not present

## 2018-08-15 DIAGNOSIS — F17210 Nicotine dependence, cigarettes, uncomplicated: Secondary | ICD-10-CM

## 2018-08-15 DIAGNOSIS — Z801 Family history of malignant neoplasm of trachea, bronchus and lung: Secondary | ICD-10-CM | POA: Diagnosis not present

## 2018-08-15 DIAGNOSIS — C7931 Secondary malignant neoplasm of brain: Secondary | ICD-10-CM

## 2018-08-15 DIAGNOSIS — C50312 Malignant neoplasm of lower-inner quadrant of left female breast: Secondary | ICD-10-CM

## 2018-08-15 DIAGNOSIS — C50919 Malignant neoplasm of unspecified site of unspecified female breast: Secondary | ICD-10-CM

## 2018-08-15 DIAGNOSIS — M2550 Pain in unspecified joint: Secondary | ICD-10-CM | POA: Diagnosis not present

## 2018-08-15 NOTE — Telephone Encounter (Signed)
Per 9/16 no los 

## 2018-08-15 NOTE — Progress Notes (Signed)
[facility]  [phone]) [phone] Fax:(336) [phone]     ID: [name] DOB: 20-Oct-1961  MR#: [id number]  QMG#:[id number]  Patient Care Team: Charlott Rakes, MD as PCP - General (Family Medicine) Kyung Rudd, MD as Consulting Physician (Radiation Oncology) Erline Levine, MD as Consulting Physician (Neurosurgery) Corena Pilgrim, MD as Consulting Physician (Psychiatry) Mickeal Skinner Acey Lav, MD as Consulting Physician (Psychiatry) Nehemiah Settle, MD as Referring Physician (Plastic Surgery) Irene Limbo, MD as Consulting Physician (Plastic Surgery)  CHIEF COMPLAINT: Metastatic triple positive breast cancer  CURRENT TREATMENT: Trastuzumab, denosumab, anastrozole   INTERVAL HISTORY: [name] returns today for a follow-up and treatment of her metastatic triple positive breast cancer. She continues on anastrozole with good tolerance.    She also receives trastuzumab every 28 days, with a dose due today. She tolerates this well. She denies fatigue or issues with diarrhea.  She completed an echocardiogram on 06/30/2018 showing an ejection fraction in the 55-60% range.     REVIEW OF SYSTEMS: [name] is doing well today.  She notes pain in her knees and right shoulder pain.  The right shoulder pain started about two weeks ago.  The pain is worse with movement and Motrin has helped it.  The pain is constant with varying severity. She also notes arthralgias in her knees.  She notes she did fall a few days ago carrying laundry and tripping.  She wants to know if there is anything to help "lube" her joints.  She is taking Motrin '800mg'$  in the middle of the night, and one mid morning.    [name] is doing well otherwise and denies any other issues today.   She is without any mucositis, taste issues, appetite loss or unintentional weight loss.  She has some mild constipation.  She denies nausea, vomiting, or diarrhea.  She is without shortness of breath, chest pain, or  palpitations.  A detailed ROS is otherwise non contributory today.    BREAST CANCER HISTORY: From the original intake note:  [name] was aware of a "lemon sized lump in" her left axilla for about a year before bringing it to medical attention. By then she had developed left breast and left axillary swelling (June 2016). She presented to the local emergency room and had a chest CT scan 06/06/2015 which showed a nodule in the left breast measuring 0.9 cm and questionable left axillary adenopathy. She then proceeded to bilateral diagnostic mammography and left breast ultrasonography 06/19/2015. There were no prior films for comparison (last mammography 12 years prior).. The breast density was category C. Mammography showed in the left breast upper inner quadrant a 7 cm area including a small mass and significant pleomorphic calcifications. Ultrasonography defined the mass as measuring 1.2 cm. The left axilla appeared unremarkable. There was significant skin edema.  Biopsy of the left breast mass 06/19/2015 showed (SP [id number]) an invasive ductal carcinoma, grade 2, estrogen receptor 83% positive, progesterone receptor 26% positive, and HER-2 amplified by immunohistochemstry with a 3+ reading. The patient had biopsies of a separate area in the left breast August of the same year and this showed atypical ductal hyperplasia. (SP [id number]).  Accordingly after appropriate discussion on 08/21/2015 the patient proceeded to left mastectomy with left axillary sentinel lymph node sampling, which, since the lymph nodes were positive, extended to the procedure to left axillary lymph node dissection. The pathology (SP [id number]) showed an invasive ductal carcinoma, grade 3, measuring in excess of 9 cm. There were also skin satellites, not contiguous with the  invasive carcinoma. Margins were clear and ample. There was evidence of lymphovascular invasion. A total of 15 lymph nodes were removed, including 5 sentinel lymph nodes,  all of which were positive, so that the final total was 14 out of 15 lymph nodes involved by tumor. There was evidence of extranodal extension. The final pathology was pT4b pN2a, stage IIIB  CA-27-29 and CEA 09/19/2015 were non-informative October 2016.  Unfortunately CT scans of the chest abdomen and pelvis 09/16/2015 showed bony metastases to the right scapula, left iliac crest, and also L4 and T-spine. There were questionable liver cysts which on repeat CT scan 03/02/2016 appear to be a little bit more well-defined, possibly a little larger. There were also some possible right upper lobe lung lesions.  Adjuvant treatment consisted of docetaxel, trastuzumab and pertuzumab, with the final (6th) docetaxel dose given 02/11/2016. She continues on trastuzumab and pertuzumab, with the 11th cycle given 05/05/2016. Echocardiogram 02/26/2016 showed an ejection fraction of 55%. She receives denosumab/Xgeva every 4 weeks.. She also receives radiation, started 06/09/201, to be completed 06/26/2016.  Her subsequent history is as detailed below   PAST MEDICAL HISTORY: Past Medical History:  Diagnosis Date  . Alcohol abuse   . Anemia    during chemo  . Anxiety    At age 46  . Arthritis Dx 2010  . Bipolar disorder ([place])   . Bronchitis   . Cancer ([place])    breast mets to brain  . Chronic pain   . Complication of anesthesia   . Depression   . Family history of adverse reaction to anesthesia    parents had PONV  . Fibromyalgia Dx 2005  . GERD (gastroesophageal reflux disease)   . Headache    hx  migraines  . Lymphedema of left arm   . Opiate dependence ([place])   . PONV (postoperative nausea and vomiting)   . Port-A-Cath in place   . PTSD (post-traumatic stress disorder)      PAST SURGICAL HISTORY: Past Surgical History:  Procedure Laterality Date  . APPLICATION OF CRANIAL NAVIGATION N/A 08/14/2016   Procedure: APPLICATION OF CRANIAL NAVIGATION;  Surgeon: Erline Levine, MD;  Location: [facility];  Service: Neurosurgery;  Laterality: N/A;  . BREAST RECONSTRUCTION Left    with silicone implant  . CRANIOTOMY N/A 08/14/2016   Procedure: CRANIOTOMY TUMOR EXCISION WITH Lucky Rathke;  Surgeon: Erline Levine, MD;  Location: [facility];  Service: Neurosurgery;  Laterality: N/A;  . FIBULA FRACTURE SURGERY Left   . MASTECTOMY Left   . RADIOLOGY WITH ANESTHESIA N/A 07/23/2016   Procedure: MRI OF BRAIN WITH AND WITHOUT;  Surgeon: Medication Radiologist, MD;  Location: [facility];  Service: Radiology;  Laterality: N/A;  . RADIOLOGY WITH ANESTHESIA N/A 09/08/2016   Procedure: MRI OF BRAIN WITH AND WITHOUT CONTRAST;  Surgeon: Medication Radiologist, MD;  Location: [facility];  Service: Radiology;  Laterality: N/A;  . RADIOLOGY WITH ANESTHESIA N/A 12/10/2016   Procedure: MRI OF BRAIN WITH AND WITHOUT;  Surgeon: Medication Radiologist, MD;  Location: [facility];  Service: Radiology;  Laterality: N/A;  . RADIOLOGY WITH ANESTHESIA N/A 03/02/2017   Procedure: MRI of BRAIN W and W/OUT CONTRAST;  Surgeon: Medication Radiologist, MD;  Location: [facility];  Service: Radiology;  Laterality: N/A;  . RADIOLOGY WITH ANESTHESIA N/A 07/29/2017   Procedure: RADIOLOGY WITH ANESTHESIA MRI OF BRAIN WITH AND WITHOUT CONTRAST;  Surgeon: Radiologist, Medication, MD;  Location: [facility];  Service: Radiology;  Laterality: N/A;  . RADIOLOGY WITH ANESTHESIA N/A 12/07/2017  Procedure: MRI WITH ANESTHESIA OF BRAIN WITH AND WITHOUT CONTRAST;  Surgeon: Radiologist, Medication, MD;  Location: [facility];  Service: Radiology;  Laterality: N/A;  . RADIOLOGY WITH ANESTHESIA N/A 04/07/2018   Procedure: MRI OF BRAIN WITH AND WITHOUT CONTRAST;  Surgeon: Radiologist, Medication, MD;  Location: [facility];  Service: Radiology;  Laterality: N/A;  . right power port placement Right      FAMILY HISTORY Family History  Problem Relation Age of Onset  . Diabetes Mother   . Bipolar disorder Mother   . CAD Father    The patient's father still living, age 51, in [place].  He had prostate cancer at some point in the past. The patient's mother died at age 62 from complications of diabetes. The patient had no brothers, 2 sisters. A paternal grandmother had lung cancer in the setting of tobacco abuse. There is no other history of cancer in the family to her knowledge  GYNECOLOGIC HISTORY:  No LMP recorded. Patient has had an ablation. Menarche approximately age 52. First live birth in [birth year]. The patient is GX P2. She underwent endometrial ablation in 2016.  SOCIAL HISTORY: Updated August 2019 The patient is originally from [place]. She has lived in [place] before but more recently was in [place]. She is back here because she could not afford her rent in [place]. She is living here and a temporary situation. She is divorced. Her 2 children are [name] who lives in [place] and works as a Development worker, community, and [name] who also lives in [place] and works as a Catering manager. The patient has a grandchild, Arelia Longest, 62 years old as of July 2019, living in [place] with his mother. The patient also has a grandson born in February 2019, who also lives in [place]. The patient has not established herself with a local church yet.    ADVANCED DIRECTIVES: Not in place; at the 06/03/2016 visit the patient was given the appropriate forms to complete and notarize at her discretion   HEALTH MAINTENANCE: Social History   Tobacco Use  . Smoking status: Current Every Day Smoker    Packs/day: 1.00    Types: Cigarettes  . Smokeless tobacco: Never Used  Substance Use Topics  . Alcohol use: No    Comment: no ETOH since 08/22/12  . Drug use: No    Comment: states she's in recovery program for 5 years     Colonoscopy:  PAP:  Bone density:   Allergies  Allergen Reactions  . Demerol Itching and Nausea And Vomiting  . Erythromycin Rash    Current Outpatient Medications on File Prior to Visit  Medication Sig Dispense Refill  . anastrozole (ARIMIDEX) 1 MG tablet TAKE 1  TABLET BY MOUTH EVERY DAY 90 tablet 1  . Biotin 1000 MCG tablet Take 1,000 mcg by mouth daily.    . Cholecalciferol (VITAMIN D3) 5000 units CAPS Take 5,000 Units by mouth daily.    . cyclobenzaprine (FLEXERIL) 5 MG tablet TAKE 2 TABLET BY MOUTH TWICE A DAY AS NEEDED FOR MUSCLE SPASMS 120 tablet 0  . cycloSPORINE (RESTASIS) 0.05 % ophthalmic emulsion Place 1 drop into both eyes 2 (two) times daily.    [name] docusate sodium (COLACE) 100 MG capsule Take 100 mg by mouth daily as needed for mild constipation.     . fluticasone (FLONASE) 50 MCG/ACT nasal spray SPRAY 2 SPRAYS INTO EACH NOSTRIL EVERY DAY 16 g 2  . gabapentin (NEURONTIN) 300 MG capsule Take 1 capsule (300 mg total) by mouth 2 (two) times  daily. (Patient taking differently: Take 300-600 mg by mouth See admin instructions. Take 300 mg by mouth in the morning and take 600 mg by mouth at bedtime) 60 capsule 3  . ibuprofen (ADVIL,MOTRIN) 800 MG tablet TAKE 1 TABLET BY MOUTH THREE TIMES A DAY 90 tablet 0  . lamoTRIgine (LAMICTAL) 25 MG tablet Take 1 tablet (25 mg total) by mouth 2 (two) times daily. (Patient taking differently: Take 75 mg by mouth every morning. ) 60 tablet 0  . loratadine (CLARITIN) 10 MG tablet TAKE 1 TABLET BY MOUTH EVERY DAY 30 tablet 11  . lurasidone (LATUDA) 40 MG TABS tablet Take 1 tablet (40 mg total) by mouth daily with breakfast. 30 tablet 0  . ondansetron (ZOFRAN) 8 MG tablet Take 1 tablet (8 mg total) by mouth every 8 (eight) hours as needed for nausea or vomiting. 90 tablet 1  . pantoprazole (PROTONIX) 40 MG tablet Take 1 tablet (40 mg total) by mouth daily. 30 tablet 3  . polyethylene glycol (MIRALAX / GLYCOLAX) packet TAKE 17 G BY MOUTH DAILY AS NEEDED FOR MILD CONSTIPATION. 30 packet 0  . traZODone (DESYREL) 100 MG tablet Take 1 tablet (100 mg total) by mouth at bedtime. (Patient taking differently: Take 200 mg by mouth at bedtime. ) 30 tablet 0  . varenicline (CHANTIX) 0.5 MG tablet Take 0.5 mg by mouth daily.     No  current facility-administered medications on file prior to visit.      OBJECTIVE: Middle-aged white woman who appears well  Vitals:   08/15/18 0918  BP: 129/85  Pulse: 88  Resp: 18  Temp: 98.1 F (36.7 C)  TempSrc: Oral  SpO2: 96%  Weight: 164 lb 8 oz (74.6 kg)  Height: _0  (1.651 m)  Body mass index is 27.37 kg/m.  ECOG FS: 1 - Symptomatic but completely ambulatory GENERAL: Patient is a well appearing female in no acute distress HEENT:  Sclerae anicteric.  Oropharynx clear and moist. No ulcerations or evidence of oropharyngeal candidiasis. Neck is supple.  NODES:  No cervical, supraclavicular, or axillary lymphadenopathy palpated.  BREAST EXAM:  Right breast is without any nodules, masses, skin or nipple changes, left breast is s/p mastectomy and implant placement, no nodularity or sign of local recurrence noted LUNGS:  Clear to auscultation bilaterally.  No wheezes or rhonchi. HEART:  Regular rate and rhythm. No murmur appreciated. ABDOMEN:  Soft, nontender.  Positive, normoactive bowel sounds. No organomegaly palpated. MSK:  No focal spinal tenderness to palpation. Full range of motion bilaterally in the upper extremities. Pain noted in right shoulder with active ROM.  Right rhomboid muscle has mild TTP.   EXTREMITIES:  No peripheral edema.   SKIN:  Clear with no obvious rashes or skin changes. No nail dyscrasia. NEURO:  Nonfocal. Well oriented.  Appropriate affect.    LAB RESULTS: No results found for: LABCA2  CBC    Component Value Date/Time   WBC 4.2 07/19/2018 0817   RBC 4.06 07/19/2018 0817   HGB 12.7 07/19/2018 0817   HGB 12.1 10/26/2017 1038   HCT 38.2 07/19/2018 0817   HCT 35.7 10/26/2017 1038   PLT 177 07/19/2018 0817   PLT 167 10/26/2017 1038   MCV 94.1 07/19/2018 0817   MCV 98.4 10/26/2017 1038   MCH 31.3 07/19/2018 0817   MCHC 33.2 07/19/2018 0817   RDW 13.2 07/19/2018 0817   RDW 13.3 10/26/2017 1038   LYMPHSABS 0.9 07/19/2018 0817   LYMPHSABS  0.5 (L) 10/26/2017 1038  MONOABS 0.3 07/19/2018 0817   MONOABS 0.1 10/26/2017 1038   EOSABS 0.0 07/19/2018 0817   EOSABS 0.0 10/26/2017 1038   BASOSABS 0.0 07/19/2018 0817   BASOSABS 0.0 10/26/2017 1038   CMP Latest Ref Rng & Units 07/19/2018 06/22/2018 05/25/2018  Glucose 70 - 99 mg/dL 101(H) 101(H) 108(H)  BUN 6 - 20 mg/dL _0 Creatinine 0.44 - 1.00 mg/dL 0.96 1.01(H) 0.97  Sodium 135 - 145 mmol/L 141 140 140  Potassium 3.5 - 5.1 mmol/L 4.3 4.2 4.0  Chloride 98 - 111 mmol/L 104 103 104  CO2 22 - 32 mmol/L _1 Calcium 8.9 - 10.3 mg/dL 9.4 9.8 9.6  Total Protein 6.5 - 8.1 g/dL 6.9 6.8 6.4(L)  Total Bilirubin 0.3 - 1.2 mg/dL 0.3 <0.2(L) 0.2(L)  Alkaline Phos 38 - 126 U/L 71 67 54  AST 15 - 41 U/L _2 ALT 0 - 44 U/L _3 STUDIES: No results found.  ELIGIBLE FOR AVAILABLE RESEARCH PROTOCOL: no  ASSESSMENT: 57 y.o. 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  (2) status post left mastectomy and axillary lymph node dissection  with immediate expander placement 07/18/2015 for an mpT4 pN2,stage IIIB invasive ductal carcinoma, grade 3, with negative margins.  (a) definitive implant exchange to be scheduled in December   METASTATIC DISEASE: October 2016  (3) CT scan of the chest abdomen and pelvis  09/16/2015 shows metastatic lesions in the right scapula, left iliac crest, L4, and T spine. There were questionable liver cysts, with repeat CT scan 03/02/2016 showing possible right upper lobe lung lesions and possibly increased liver lesions  (a) CT scan of the chest 06/17/2016 shows no active disease in the lungs or liver  (b) Bone scan July 2017 showed no evidence of bony metastatic disease   (c) head CT 07/08/2016 showed a cerebellar lesion, confirmed by MRI 07/23/2016,  status post craniotomy 08/14/2016, confirming a metastatic deposit which was estrogen and progesterone receptor negative, HER-2 amplified with a signals ratio of 7.16, number per cell 13.25  (d) CA 27-29 is not informative  (4) received docetaxel every 3 weeks 6 together with trastuzumab and pertuzumab, last docetaxel dose 02/11/2016  (5) adjuvant radiation7/03/2016 to 06/26/2016 at [facility]: 1. The Left chest wall was treated to 23.4 Gy in 13 fractions at 1.8 Gy per fraction. 2. The Left chest wall was boosted to 10 Gy in 5 fractions at 2 Gy per fraction. 3. The Left Sclav/PAB was treated to 23.4 Gy in 13 fractions at 1.8 Gy per fraction.  [Note: Including the patient's treatment in [place] (received 15 fractions per Dr. Maryan Rued near [place], [place]), the patient received 50.4 Gy to the left chest wall and supraclavicular region. ]  (6) started trastuzumab and pertuzumab October 2016, continuing every 3 weeks,  (a) echocardiogram 02/26/2016 showed a well preserved ejection fraction  (b) echocardiogram 07/01/2016 shows an ejection fraction in the 60-65%   (c) pertuzumab discontinued 10/2016 with uncontrolled diarrhea  (d) echocardiogram 11/11/2016 showed an ejection fraction in the 60-65%  (e) echocardiogram 03/03/2017 shows an ejection fraction of 60-65%  (f) echocardiogram on 05/19/2017 shows an ejection fraction of 55-60%  (g) echocardiogram 09/24/2017 shows the ejection fraction in the 60-65%  (h) echocardiogram 02/14/2018 shows an ejection fraction in the 60-65%  (  I) echocardiogram  06/30/2018 shows an ejection fraction in the 55-60%    (7) started denosumab/Xgeva October 2017, repeated every 4 weeks  (8) started anastrozole October 2017   (a) bone scan 11/10/2016 shows no active disease  (b) chest CT scan 11/10/2016 stable, with no evidence of active disease  (c) chest CT and bone scan 07/02/2017 show no evidence of active disease  (d) CT scan of the chest with contrast  11/10/2017 shows some left axillary edema, but no evidence of thrombosis or adenopathy in that area, 0.9 cm precarinal lymph and 0.7 cm right upper lobe nodule node; bone lesions stable  (e) CT of the chest 05/04/2018 shows a 1.4 cm right lower paratracheal node which is slightly increased and a new right prevascular mediastinal node measuring 0.7 cm.  Bone lesions are stable.  (f) chest CT on 07/01/2018 shows no definite findings of metastatic disease in the thorax. 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  (a) brain irradiation10/19/17-11/08/17: Whole brain/ 35 Gy in 14 fractions   (b) repeat brain MRI obtained 12/10/2016 shows no active disease in the brain  (c) repeat brain MRI 03/02/2017 shows no evidence of residual or recurrent disease  (d) repeat brain MRI 07/29/2017 shows no evidence of residual or recurrent disease  (e) repeat brain MRI 12/07/2017 shows no evidence of disease recurrence.  There is progressive white matter change secondary to prior treatment.  (f) repeat brain MRI 04/07/2018 showed no evidence of disease   PLAN:  Areli is doing well today.  She will continue on Anastrozole, Xgeva, and Trastuzumab as she is tolerating this treatment well.  She does have some arthralgias.  I recommended yoga and tai chi to her to help this.  She is also taking Motrin BID.    Oveta will undergo shoulder xray to evaluate her shoulder.  She will do this tomorrow.   Warren Lacy and I reviewed her plan of care which includes  upcoming MRI and f/u with Dr. Mickeal Skinner shortly thereafter.  She will continue to return every four weeks for labs and Trastuzumab/Xgeva and I will see her next in November.    She knows to call for any other issues that may develop before the next visit, as we can certainly see her sooner if needed.    A total of (30) minutes of face-to-face time was spent with this patient with greater than 50% of that time in counseling and care-coordination.  Wilber Bihari, NP  08/15/18 9:36 AM Medical Oncology and Hematology [facility] [address] [place], [place] [address] Tel. [phone]    Fax. [phone]

## 2018-08-16 ENCOUNTER — Inpatient Hospital Stay: Payer: Medicaid Other

## 2018-08-16 ENCOUNTER — Ambulatory Visit: Payer: Self-pay | Admitting: Internal Medicine

## 2018-08-16 VITALS — BP 115/80 | HR 91 | Temp 98.6°F | Resp 18

## 2018-08-16 DIAGNOSIS — C50312 Malignant neoplasm of lower-inner quadrant of left female breast: Secondary | ICD-10-CM

## 2018-08-16 DIAGNOSIS — Z5112 Encounter for antineoplastic immunotherapy: Secondary | ICD-10-CM | POA: Diagnosis not present

## 2018-08-16 LAB — CBC WITH DIFFERENTIAL/PLATELET
Basophils Absolute: 0 10*3/uL (ref 0.0–0.1)
Basophils Relative: 0 %
Eosinophils Absolute: 0 10*3/uL (ref 0.0–0.5)
Eosinophils Relative: 1 %
HCT: 39.7 % (ref 34.8–46.6)
Hemoglobin: 13.2 g/dL (ref 11.6–15.9)
Lymphocytes Relative: 27 %
Lymphs Abs: 1.1 10*3/uL (ref 0.9–3.3)
MCH: 31.2 pg (ref 25.1–34.0)
MCHC: 33.2 g/dL (ref 31.5–36.0)
MCV: 93.9 fL (ref 79.5–101.0)
Monocytes Absolute: 0.3 10*3/uL (ref 0.1–0.9)
Monocytes Relative: 7 %
Neutro Abs: 2.7 10*3/uL (ref 1.5–6.5)
Neutrophils Relative %: 65 %
Platelets: 164 10*3/uL (ref 145–400)
RBC: 4.23 MIL/uL (ref 3.70–5.45)
RDW: 12.9 % (ref 11.2–14.5)
WBC: 4.2 10*3/uL (ref 3.9–10.3)

## 2018-08-16 LAB — COMPREHENSIVE METABOLIC PANEL
ALT: 11 U/L (ref 0–44)
AST: 14 U/L — ABNORMAL LOW (ref 15–41)
Albumin: 4 g/dL (ref 3.5–5.0)
Alkaline Phosphatase: 66 U/L (ref 38–126)
Anion gap: 11 (ref 5–15)
BUN: 12 mg/dL (ref 6–20)
CO2: 27 mmol/L (ref 22–32)
Calcium: 9.9 mg/dL (ref 8.9–10.3)
Chloride: 104 mmol/L (ref 98–111)
Creatinine, Ser: 1.16 mg/dL — ABNORMAL HIGH (ref 0.44–1.00)
GFR calc Af Amer: 59 mL/min — ABNORMAL LOW (ref >60)
GFR calc non Af Amer: 51 mL/min — ABNORMAL LOW (ref >60)
Glucose, Bld: 122 mg/dL — ABNORMAL HIGH (ref 70–99)
Potassium: 4.2 mmol/L (ref 3.5–5.1)
Sodium: 142 mmol/L (ref 135–145)
Total Bilirubin: 0.3 mg/dL (ref 0.3–1.2)
Total Protein: 7 g/dL (ref 6.5–8.1)

## 2018-08-16 MED ORDER — TRASTUZUMAB CHEMO 150 MG IV SOLR
450.0000 mg | Freq: Once | INTRAVENOUS | Status: AC
  Administered 2018-08-16: 450 mg via INTRAVENOUS
  Filled 2018-08-16: qty 21.43

## 2018-08-16 MED ORDER — INFLUENZA VAC SPLIT QUAD 0.5 ML IM SUSY
0.5000 mL | PREFILLED_SYRINGE | Freq: Once | INTRAMUSCULAR | Status: AC
  Administered 2018-08-16: 0.5 mL via INTRAMUSCULAR

## 2018-08-16 MED ORDER — DENOSUMAB 120 MG/1.7ML ~~LOC~~ SOLN
120.0000 mg | Freq: Once | SUBCUTANEOUS | Status: AC
  Administered 2018-08-16: 120 mg via SUBCUTANEOUS

## 2018-08-16 MED ORDER — DENOSUMAB 120 MG/1.7ML ~~LOC~~ SOLN
SUBCUTANEOUS | Status: AC
  Filled 2018-08-16: qty 1.7

## 2018-08-16 MED ORDER — DIPHENHYDRAMINE HCL 25 MG PO CAPS: 25.0000 mg | ORAL_CAPSULE | Freq: Once | ORAL | Status: DC

## 2018-08-16 MED ORDER — SODIUM CHLORIDE 0.9% FLUSH
10.0000 mL | INTRAVENOUS | Status: DC | PRN
  Administered 2018-08-16: 10 mL
  Filled 2018-08-16: qty 10

## 2018-08-16 MED ORDER — INFLUENZA VAC SPLIT QUAD 0.5 ML IM SUSY
PREFILLED_SYRINGE | INTRAMUSCULAR | Status: AC
  Filled 2018-08-16: qty 0.5

## 2018-08-16 MED ORDER — HEPARIN SOD (PORK) LOCK FLUSH 100 UNIT/ML IV SOLN
500.0000 [IU] | Freq: Once | INTRAVENOUS | Status: AC | PRN
  Administered 2018-08-16: 500 [IU]
  Filled 2018-08-16: qty 5

## 2018-08-16 MED ORDER — SODIUM CHLORIDE 0.9 % IV SOLN
Freq: Once | INTRAVENOUS | Status: AC
  Administered 2018-08-16: 10:00:00 via INTRAVENOUS
  Filled 2018-08-16: qty 250

## 2018-08-16 MED ORDER — ACETAMINOPHEN 325 MG PO TABS: 650.0000 mg | ORAL_TABLET | Freq: Once | ORAL | Status: DC

## 2018-08-16 NOTE — Patient Instructions (Addendum)
[facility] Discharge Instructions for Patients Receiving Chemotherapy  Today you received the following chemotherapy agents: Trastuzumab (Herceptin)  To help prevent nausea and vomiting after your treatment, we encourage you to take your nausea medication as directed.    If you develop nausea and vomiting that is not controlled by your nausea medication, call the clinic.   BELOW ARE SYMPTOMS THAT SHOULD BE REPORTED IMMEDIATELY:  *FEVER GREATER THAN 100.5 F  *CHILLS WITH OR WITHOUT FEVER  NAUSEA AND VOMITING THAT IS NOT CONTROLLED WITH YOUR NAUSEA MEDICATION  *UNUSUAL SHORTNESS OF BREATH  *UNUSUAL BRUISING OR BLEEDING  TENDERNESS IN MOUTH AND THROAT WITH OR WITHOUT PRESENCE OF ULCERS  *URINARY PROBLEMS  *BOWEL PROBLEMS  UNUSUAL RASH Items with * indicate a potential emergency and should be followed up as soon as possible.  Feel free to call the clinic should you have any questions or concerns. The clinic phone number is [phone].  Please show the [facility] at check-in to the Emergency Department and triage nurse.  Denosumab injection What is this medicine? DENOSUMAB (den oh sue mab) slows bone breakdown. Prolia is used to treat osteoporosis in women after menopause and in men. Delton See is used to treat a high calcium level due to cancer and to prevent bone fractures and other bone problems caused by multiple myeloma or cancer bone metastases. Delton See is also used to treat giant cell tumor of the bone. This medicine may be used for other purposes; ask your health care provider or pharmacist if you have questions. COMMON BRAND NAME(S): Prolia, XGEVA What should I tell my health care provider before I take this medicine? They need to know if you have any of these conditions: -dental disease -having surgery or tooth extraction -infection -kidney disease -low levels of calcium or Vitamin D in the blood -malnutrition -on hemodialysis -skin conditions or  sensitivity -thyroid or parathyroid disease -an unusual reaction to denosumab, other medicines, foods, dyes, or preservatives -pregnant or trying to get pregnant -breast-feeding How should I use this medicine? This medicine is for injection under the skin. It is given by a health care professional in a hospital or clinic setting. If you are getting Prolia, a special MedGuide will be given to you by the pharmacist with each prescription and refill. Be sure to read this information carefully each time. For Prolia, talk to your pediatrician regarding the use of this medicine in children. Special care may be needed. For Delton See, talk to your pediatrician regarding the use of this medicine in children. While this drug may be prescribed for children as young as 13 years for selected conditions, precautions do apply. Overdosage: If you think you have taken too much of this medicine contact a poison control center or emergency room at once. NOTE: This medicine is only for you. Do not share this medicine with others. What if I miss a dose? It is important not to miss your dose. Call your doctor or health care professional if you are unable to keep an appointment. What may interact with this medicine? Do not take this medicine with any of the following medications: -other medicines containing denosumab This medicine may also interact with the following medications: -medicines that lower your chance of fighting infection -steroid medicines like prednisone or cortisone This list may not describe all possible interactions. Give your health care provider a list of all the medicines, herbs, non-prescription drugs, or dietary supplements you use. Also tell them if you smoke, drink alcohol, or  use illegal drugs. Some items may interact with your medicine. What should I watch for while using this medicine? Visit your doctor or health care professional for regular checks on your progress. Your doctor or health care  professional may order blood tests and other tests to see how you are doing. Call your doctor or health care professional for advice if you get a fever, chills or sore throat, or other symptoms of a cold or flu. Do not treat yourself. This drug may decrease your body's ability to fight infection. Try to avoid being around people who are sick. You should make sure you get enough calcium and vitamin D while you are taking this medicine, unless your doctor tells you not to. Discuss the foods you eat and the vitamins you take with your health care professional. See your dentist regularly. Brush and floss your teeth as directed. Before you have any dental work done, tell your dentist you are receiving this medicine. Do not become pregnant while taking this medicine or for 5 months after stopping it. Talk with your doctor or health care professional about your birth control options while taking this medicine. Women should inform their doctor if they wish to become pregnant or think they might be pregnant. There is a potential for serious side effects to an unborn child. Talk to your health care professional or pharmacist for more information. What side effects may I notice from receiving this medicine? Side effects that you should report to your doctor or health care professional as soon as possible: -allergic reactions like skin rash, itching or hives, swelling of the face, lips, or tongue -bone pain -breathing problems -dizziness -jaw pain, especially after dental work -redness, blistering, peeling of the skin -signs and symptoms of infection like fever or chills; cough; sore throat; pain or trouble passing urine -signs of low calcium like fast heartbeat, muscle cramps or muscle pain; pain, tingling, numbness in the hands or feet; seizures -unusual bleeding or bruising -unusually weak or tired Side effects that usually do not require medical attention (report to your doctor or health care professional  if they continue or are bothersome): -constipation -diarrhea -headache -joint pain -loss of appetite -muscle pain -runny nose -tiredness -upset stomach This list may not describe all possible side effects. Call your doctor for medical advice about side effects. You may report side effects to FDA at 1-800-FDA-1088. Where should I keep my medicine? This medicine is only given in a clinic, doctor's office, or other health care setting and will not be stored at home. NOTE: This sheet is a summary. It may not cover all possible information. If you have questions about this medicine, talk to your doctor, pharmacist, or health care provider.  2018 Elsevier/Gold Standard (2016-12-08 19:17:21)  Influenza (Flu) Vaccine (Inactivated or Recombinant): What You Need to Know 1. Why get vaccinated? Influenza ("flu") is a contagious disease that spreads around the [place] every year, usually between October and May. Flu is caused by influenza viruses, and is spread mainly by coughing, sneezing, and close contact. Anyone can get flu. Flu strikes suddenly and can last several days. Symptoms vary by age, but can include:  fever/chills  sore throat  muscle aches  fatigue  cough  headache  runny or stuffy nose  Flu can also lead to pneumonia and blood infections, and cause diarrhea and seizures in children. If you have a medical condition, such as heart or lung disease, flu can make it worse. Flu is more dangerous for  some people. Infants and young children, people 17 years of age and older, pregnant women, and people with certain health conditions or a weakened immune system are at greatest risk. Each year thousands of people in the [place] die from flu, and many more are hospitalized. Flu vaccine can:  keep you from getting flu,  make flu less severe if you do get it, and  keep you from spreading flu to your family and other people. 2. Inactivated and recombinant flu  vaccines A dose of flu vaccine is recommended every flu season. Children 6 months through 29 years of age may need two doses during the same flu season. Everyone else needs only one dose each flu season. Some inactivated flu vaccines contain a very small amount of a mercury-based preservative called thimerosal. Studies have not shown thimerosal in vaccines to be harmful, but flu vaccines that do not contain thimerosal are available. There is no live flu virus in flu shots. They cannot cause the flu. There are many flu viruses, and they are always changing. Each year a new flu vaccine is made to protect against three or four viruses that are likely to cause disease in the upcoming flu season. But even when the vaccine doesn't exactly match these viruses, it may still provide some protection. Flu vaccine cannot prevent:  flu that is caused by a virus not covered by the vaccine, or  illnesses that look like flu but are not.  It takes about 2 weeks for protection to develop after vaccination, and protection lasts through the flu season. 3. Some people should not get this vaccine Tell the person who is giving you the vaccine:  If you have any severe, life-threatening allergies. If you ever had a life-threatening allergic reaction after a dose of flu vaccine, or have a severe allergy to any part of this vaccine, you may be advised not to get vaccinated. Most, but not all, types of flu vaccine contain a small amount of egg protein.  If you ever had Guillain-Barr Syndrome (also called GBS). Some people with a history of GBS should not get this vaccine. This should be discussed with your doctor.  If you are not feeling well. It is usually okay to get flu vaccine when you have a mild illness, but you might be asked to come back when you feel better.  4. Risks of a vaccine reaction With any medicine, including vaccines, there is a chance of reactions. These are usually mild and go away on their own, but  serious reactions are also possible. Most people who get a flu shot do not have any problems with it. Minor problems following a flu shot include:  soreness, redness, or swelling where the shot was given  hoarseness  sore, red or itchy eyes  cough  fever  aches  headache  itching  fatigue  If these problems occur, they usually begin soon after the shot and last 1 or 2 days. More serious problems following a flu shot can include the following:  There may be a small increased risk of Guillain-Barre Syndrome (GBS) after inactivated flu vaccine. This risk has been estimated at 1 or 2 additional cases per million people vaccinated. This is much lower than the risk of severe complications from flu, which can be prevented by flu vaccine.  Young children who get the flu shot along with pneumococcal vaccine (PCV13) and/or DTaP vaccine at the same time might be slightly more likely to have a seizure caused by  fever. Ask your doctor for more information. Tell your doctor if a child who is getting flu vaccine has ever had a seizure.  Problems that could happen after any injected vaccine:  People sometimes faint after a medical procedure, including vaccination. Sitting or lying down for about 15 minutes can help prevent fainting, and injuries caused by a fall. Tell your doctor if you feel dizzy, or have vision changes or ringing in the ears.  Some people get severe pain in the shoulder and have difficulty moving the arm where a shot was given. This happens very rarely.  Any medication can cause a severe allergic reaction. Such reactions from a vaccine are very rare, estimated at about 1 in a million doses, and would happen within a few minutes to a few hours after the vaccination. As with any medicine, there is a very remote chance of a vaccine causing a serious injury or death. The safety of vaccines is always being monitored. For more information, visit: http://www.aguilar.org/ 5. What  if there is a serious reaction? What should I look for? Look for anything that concerns you, such as signs of a severe allergic reaction, very high fever, or unusual behavior. Signs of a severe allergic reaction can include hives, swelling of the face and throat, difficulty breathing, a fast heartbeat, dizziness, and weakness. These would start a few minutes to a few hours after the vaccination. What should I do?  If you think it is a severe allergic reaction or other emergency that can't wait, call 9-1-1 and get the person to the nearest hospital. Otherwise, call your doctor.  Reactions should be reported to the Vaccine Adverse Event Reporting System (VAERS). Your doctor should file this report, or you can do it yourself through the VAERS web site at www.vaers.SamedayNews.es, or by calling (339)133-7169. ? VAERS does not give medical advice. 6. The National Vaccine Injury Compensation Program The [employer] Vaccine Injury Compensation Program (VICP) is a federal program that was created to compensate people who may have been injured by certain vaccines. Persons who believe they may have been injured by a vaccine can learn about the program and about filing a claim by calling 7798656294 or visiting the [place] website at GoldCloset.com.ee. There is a time limit to file a claim for compensation. 7. How can I learn more?  Ask your healthcare provider. He or she can give you the vaccine package insert or suggest other sources of information.  Call your local or state health department.  Contact the Centers for Disease Control and Prevention (CDC): ? Call 6102931112 (1-800-CDC-INFO) or ? Visit CDC's website at https://gibson.com/ Vaccine Information Statement, Inactivated Influenza Vaccine (07/06/2014) This information is not intended to replace advice given to you by your health care provider. Make sure you discuss any questions you have with your health care provider. Document Released:  09/10/2006 Document Revised: 08/06/2016 Document Reviewed: 08/06/2016 Elsevier Interactive Patient Education  2017 [employer].

## 2018-08-17 LAB — CANCER ANTIGEN 27.29: CA 27.29: 9.8 U/mL (ref 0.0–38.6)

## 2018-08-18 ENCOUNTER — Other Ambulatory Visit: Payer: Self-pay | Admitting: Radiation Therapy

## 2018-08-22 ENCOUNTER — Encounter (HOSPITAL_COMMUNITY): Payer: Self-pay | Admitting: *Deleted

## 2018-08-22 ENCOUNTER — Other Ambulatory Visit: Payer: Self-pay | Admitting: Family Medicine

## 2018-08-22 NOTE — Progress Notes (Signed)
Denies chest pain, shob, or cardiac visit.

## 2018-08-23 ENCOUNTER — Ambulatory Visit (HOSPITAL_COMMUNITY): Payer: Medicaid Other | Admitting: Certified Registered Nurse Anesthetist

## 2018-08-23 ENCOUNTER — Other Ambulatory Visit: Payer: Self-pay

## 2018-08-23 ENCOUNTER — Encounter (HOSPITAL_COMMUNITY)
Admission: RE | Disposition: A | Discharge status: Home / Self Care | Payer: Self-pay | Source: Ambulatory Visit | Attending: Oncology

## 2018-08-23 ENCOUNTER — Ambulatory Visit (HOSPITAL_COMMUNITY)
Admission: RE | Admit: 2018-08-23 | Discharge: 2018-08-23 | Disposition: A | Discharge status: Home / Self Care | Payer: Medicaid Other | Source: Ambulatory Visit | Attending: Oncology | Admitting: Oncology

## 2018-08-23 ENCOUNTER — Encounter (HOSPITAL_COMMUNITY): Payer: Self-pay | Admitting: *Deleted

## 2018-08-23 DIAGNOSIS — F17210 Nicotine dependence, cigarettes, uncomplicated: Secondary | ICD-10-CM | POA: Diagnosis not present

## 2018-08-23 DIAGNOSIS — Z85841 Personal history of malignant neoplasm of brain: Secondary | ICD-10-CM | POA: Insufficient documentation

## 2018-08-23 DIAGNOSIS — Z791 Long term (current) use of non-steroidal anti-inflammatories (NSAID): Secondary | ICD-10-CM | POA: Insufficient documentation

## 2018-08-23 DIAGNOSIS — Z9012 Acquired absence of left breast and nipple: Secondary | ICD-10-CM | POA: Insufficient documentation

## 2018-08-23 DIAGNOSIS — D649 Anemia, unspecified: Secondary | ICD-10-CM | POA: Diagnosis not present

## 2018-08-23 DIAGNOSIS — Z79811 Long term (current) use of aromatase inhibitors: Secondary | ICD-10-CM | POA: Diagnosis not present

## 2018-08-23 DIAGNOSIS — Z79899 Other long term (current) drug therapy: Secondary | ICD-10-CM | POA: Diagnosis not present

## 2018-08-23 DIAGNOSIS — C50919 Malignant neoplasm of unspecified site of unspecified female breast: Secondary | ICD-10-CM

## 2018-08-23 DIAGNOSIS — Z923 Personal history of irradiation: Secondary | ICD-10-CM | POA: Diagnosis not present

## 2018-08-23 DIAGNOSIS — Z7951 Long term (current) use of inhaled steroids: Secondary | ICD-10-CM | POA: Insufficient documentation

## 2018-08-23 DIAGNOSIS — Z85830 Personal history of malignant neoplasm of bone: Secondary | ICD-10-CM | POA: Insufficient documentation

## 2018-08-23 DIAGNOSIS — C50312 Malignant neoplasm of lower-inner quadrant of left female breast: Secondary | ICD-10-CM | POA: Diagnosis present

## 2018-08-23 DIAGNOSIS — C7951 Secondary malignant neoplasm of bone: Secondary | ICD-10-CM

## 2018-08-23 DIAGNOSIS — F319 Bipolar disorder, unspecified: Secondary | ICD-10-CM | POA: Diagnosis not present

## 2018-08-23 DIAGNOSIS — Z170 Estrogen receptor positive status [ER+]: Secondary | ICD-10-CM | POA: Insufficient documentation

## 2018-08-23 DIAGNOSIS — C7931 Secondary malignant neoplasm of brain: Secondary | ICD-10-CM

## 2018-08-23 HISTORY — PX: RADIOLOGY WITH ANESTHESIA: SHX6223

## 2018-08-23 LAB — CBC
HCT: 37.1 % (ref 36.0–46.0)
Hemoglobin: 11.9 g/dL — ABNORMAL LOW (ref 12.0–15.0)
MCH: 30.7 pg (ref 26.0–34.0)
MCHC: 32.1 g/dL (ref 30.0–36.0)
MCV: 95.6 fL (ref 78.0–100.0)
Platelets: 174 10*3/uL (ref 150–400)
RBC: 3.88 MIL/uL (ref 3.87–5.11)
RDW: 12.4 % (ref 11.5–15.5)
WBC: 3.4 10*3/uL — ABNORMAL LOW (ref 4.0–10.5)

## 2018-08-23 LAB — BASIC METABOLIC PANEL
Anion gap: 9 (ref 5–15)
BUN: 9 mg/dL (ref 6–20)
CO2: 27 mmol/L (ref 22–32)
Calcium: 9 mg/dL (ref 8.9–10.3)
Chloride: 105 mmol/L (ref 98–111)
Creatinine, Ser: 1.05 mg/dL — ABNORMAL HIGH (ref 0.44–1.00)
GFR calc Af Amer: >60 mL/min (ref >60)
GFR calc non Af Amer: 58 mL/min — ABNORMAL LOW (ref >60)
Glucose, Bld: 111 mg/dL — ABNORMAL HIGH (ref 70–99)
Potassium: 3.8 mmol/L (ref 3.5–5.1)
Sodium: 141 mmol/L (ref 135–145)

## 2018-08-23 SURGERY — MRI WITH ANESTHESIA
Anesthesia: General

## 2018-08-23 MED ORDER — HYDROMORPHONE HCL 1 MG/ML IJ SOLN: 0.2500 mg | INTRAMUSCULAR | Status: DC | PRN

## 2018-08-23 MED ORDER — OXYCODONE HCL 5 MG PO TABS: 5.0000 mg | ORAL_TABLET | Freq: Once | ORAL | Status: DC | PRN

## 2018-08-23 MED ORDER — LACTATED RINGERS IV SOLN
INTRAVENOUS | Status: DC
  Administered 2018-08-23: 50 mL/h via INTRAVENOUS

## 2018-08-23 MED ORDER — GADOBUTROL 1 MMOL/ML IV SOLN
7.5000 mL | Freq: Once | INTRAVENOUS | Status: AC | PRN
  Administered 2018-08-23: 7 mL via INTRAVENOUS

## 2018-08-23 MED ORDER — OXYCODONE HCL 5 MG/5ML PO SOLN: 5.0000 mg | Freq: Once | ORAL | Status: DC | PRN

## 2018-08-23 MED ORDER — ONDANSETRON HCL 4 MG/2ML IJ SOLN: 4.0000 mg | Freq: Once | INTRAMUSCULAR | Status: DC | PRN

## 2018-08-23 NOTE — Anesthesia Procedure Notes (Signed)
Procedure Name: Intubation Date/Time: 08/23/2018 8:27 AM Performed by: Leonor Liv, CRNA Pre-anesthesia Checklist: Patient identified, Emergency Drugs available, Suction available and Patient being monitored Patient Re-evaluated:Patient Re-evaluated prior to induction Oxygen Delivery Method: Circle System Utilized Preoxygenation: Pre-oxygenation with 100% oxygen Induction Type: IV induction Ventilation: Mask ventilation without difficulty Laryngoscope Size: Mac and 3 Grade View: Grade II Tube type: Oral Number of attempts: 1 Airway Equipment and Method: Stylet and Oral airway Placement Confirmation: ETT inserted through vocal cords under direct vision,  positive ETCO2 and breath sounds checked- equal and bilateral Secured at: 21 cm Tube secured with: Tape Dental Injury: Teeth and Oropharynx as per pre-operative assessment

## 2018-08-23 NOTE — Anesthesia Preprocedure Evaluation (Addendum)
Anesthesia Evaluation  Patient identified by MRN, date of birth, ID band Patient awake    Reviewed: Allergy & Precautions, NPO status , Patient's Chart, lab work & pertinent test results  Airway Mallampati: II  TM Distance: >3 FB Neck ROM: Full    Dental  (+) Teeth Intact, Dental Advisory Given,    Pulmonary Current Smoker,    breath sounds clear to auscultation       Cardiovascular  Rhythm:Regular Rate:Normal     Neuro/Psych    GI/Hepatic   Endo/Other    Renal/GU      Musculoskeletal   Abdominal   Peds  Hematology   Anesthesia Other Findings   Reproductive/Obstetrics                            Anesthesia Physical Anesthesia Plan  ASA: III  Anesthesia Plan: General   Post-op Pain Management:    Induction:   PONV Risk Score and Plan: Ondansetron and Dexamethasone  Airway Management Planned: Oral ETT  Additional Equipment:   Intra-op Plan:   Post-operative Plan: Extubation in OR  Informed Consent: I have reviewed the patients History and Physical, chart, labs and discussed the procedure including the risks, benefits and alternatives for the proposed anesthesia with the patient or authorized representative who has indicated his/her understanding and acceptance.   Dental advisory given  Plan Discussed with: CRNA and Anesthesiologist  Anesthesia Plan Comments:        Anesthesia Quick Evaluation

## 2018-08-23 NOTE — Anesthesia Postprocedure Evaluation (Signed)
Anesthesia Post Note  Patient: [name]  Procedure(s) Performed: MRI WITH ANESTHESIA OF THE BRAIN WITH AND WITHOUT (N/A )     Patient location during evaluation: PACU Anesthesia Type: General Level of consciousness: awake and alert Pain management: pain level controlled Vital Signs Assessment: post-procedure vital signs reviewed and stable Respiratory status: spontaneous breathing, nonlabored ventilation, respiratory function stable and patient connected to nasal cannula oxygen Cardiovascular status: blood pressure returned to baseline and stable Postop Assessment: no apparent nausea or vomiting Anesthetic complications: no    Last Vitals:  Vitals:   08/23/18 1015 08/23/18 1023  BP: 121/79 120/74  Pulse: 93 95  Resp: 13 14  Temp:    SpO2: 93% 94%    Last Pain:  Vitals:   08/23/18 1023  TempSrc:   PainSc: 0-No pain                 [name]

## 2018-08-23 NOTE — Transfer of Care (Signed)
Immediate Anesthesia Transfer of Care Note  Patient: [name]  Procedure(s) Performed: MRI WITH ANESTHESIA OF THE BRAIN WITH AND WITHOUT (N/A )  Patient Location: PACU  Anesthesia Type:General  Level of Consciousness: awake, alert  and oriented  Airway & Oxygen Therapy: Patient Spontanous Breathing and Patient connected to nasal cannula oxygen  Post-op Assessment: Report given to RN, Post -op Vital signs reviewed and stable and Patient moving all extremities X 4  Post vital signs: Reviewed and stable  Last Vitals:  Vitals Value Taken Time  BP 116/75 08/23/2018  9:58 AM  Temp    Pulse 89 08/23/2018 10:06 AM  Resp 17 08/23/2018 10:06 AM  SpO2 95 % 08/23/2018 10:06 AM  Vitals shown include unvalidated device data.  Last Pain:  Vitals:   08/23/18 9753  TempSrc:   PainSc: 0-No pain         Complications: No apparent anesthesia complications

## 2018-08-24 ENCOUNTER — Inpatient Hospital Stay: Payer: Medicaid Other

## 2018-08-24 ENCOUNTER — Inpatient Hospital Stay (HOSPITAL_BASED_OUTPATIENT_CLINIC_OR_DEPARTMENT_OTHER): Payer: Medicaid Other | Admitting: Internal Medicine

## 2018-08-24 ENCOUNTER — Encounter (HOSPITAL_COMMUNITY): Payer: Self-pay | Admitting: Radiology

## 2018-08-24 VITALS — BP 130/77 | HR 97 | Temp 99.0°F | Resp 18 | Ht 65.0 in | Wt 169.2 lb

## 2018-08-24 DIAGNOSIS — C50312 Malignant neoplasm of lower-inner quadrant of left female breast: Secondary | ICD-10-CM

## 2018-08-24 DIAGNOSIS — C7931 Secondary malignant neoplasm of brain: Secondary | ICD-10-CM

## 2018-08-24 DIAGNOSIS — R51 Headache: Secondary | ICD-10-CM | POA: Diagnosis not present

## 2018-08-24 DIAGNOSIS — C7951 Secondary malignant neoplasm of bone: Secondary | ICD-10-CM

## 2018-08-24 DIAGNOSIS — F17210 Nicotine dependence, cigarettes, uncomplicated: Secondary | ICD-10-CM

## 2018-08-24 DIAGNOSIS — Z5112 Encounter for antineoplastic immunotherapy: Secondary | ICD-10-CM | POA: Diagnosis not present

## 2018-08-24 MED FILL — Sugammadex Sodium IV 200 MG/2ML (Base Equivalent): INTRAVENOUS | Qty: 2 | Status: AC

## 2018-08-24 MED FILL — Lactated Ringer's Solution: INTRAVENOUS | Qty: 1000 | Status: AC

## 2018-08-24 MED FILL — Rocuronium Bromide IV Soln 50 MG/5ML (10 MG/ML): INTRAVENOUS | Qty: 5 | Status: AC

## 2018-08-24 MED FILL — Midazolam HCl Inj 2 MG/2ML (Base Equivalent): INTRAMUSCULAR | Qty: 2 | Status: AC

## 2018-08-24 MED FILL — Propofol IV Emul 200 MG/20ML (10 MG/ML): INTRAVENOUS | Qty: 20 | Status: AC

## 2018-08-24 MED FILL — Dexamethasone Sod Phosphate Preservative Free Inj 10 MG/ML: INTRAMUSCULAR | Qty: 1 | Status: AC

## 2018-08-24 MED FILL — Phenylephrine-NaCl Pref Syr 0.4 MG/10ML-0.9% (40 MCG/ML): INTRAVENOUS | Qty: 10 | Status: AC

## 2018-08-24 MED FILL — Ondansetron HCl Inj 4 MG/2ML (2 MG/ML): INTRAMUSCULAR | Qty: 2 | Status: AC

## 2018-08-24 MED FILL — Fentanyl Citrate Preservative Free (PF) Inj 100 MCG/2ML: INTRAMUSCULAR | Qty: 2 | Status: AC

## 2018-08-24 MED FILL — Lidocaine HCl(Cardiac) IV PF Soln Pref Syr 100 MG/5ML (2%): INTRAVENOUS | Qty: 5 | Status: AC

## 2018-08-24 MED FILL — Phenylephrine-NaCl IV Solution 10 MG/250ML-0.9%: INTRAVENOUS | Qty: 250 | Status: AC

## 2018-08-24 NOTE — Progress Notes (Signed)
[facility] at [place], [place] [address] [phone]   Interval Evaluation  Date of Service: 08/24/18 Patient Name: [name] Patient MRN: [id number] Patient DOB: 11/11/1961 Provider: Ventura Sellers, MD  Identifying Statement:  [name] is a 57 y.o. 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 Vertell Limber 10/07/16: Whole brain treated to 35 Gy in 14 fractions with Dr. Lisbeth Renshaw  Interval History: [name] presents for follow up after recent MRI brain.  She describes no new or progressive neurologic deficits. Headaches persist as prior, not worsened.          Current Outpatient Medications on File Prior to Visit  Medication Sig Dispense Refill  . anastrozole (ARIMIDEX) 1 MG tablet TAKE 1 TABLET BY MOUTH EVERY DAY 90 tablet 1  . Biotin 1000 MCG tablet Take 1,000 mcg by mouth daily.    . Cholecalciferol (VITAMIN D3) 5000 units CAPS Take 5,000 Units by mouth daily.    . cyclobenzaprine (FLEXERIL) 5 MG tablet TAKE 2 TABLET BY MOUTH TWICE A DAY AS NEEDED FOR MUSCLE SPASMS (Patient taking differently: Take 10 mg by mouth 2 (two) times daily as needed for muscle spasms. ) 120 tablet 0  . docusate sodium (COLACE) 100 MG capsule Take 100 mg by mouth daily as needed for mild constipation.     . fluticasone (FLONASE) 50 MCG/ACT nasal spray SPRAY 2 SPRAYS INTO EACH NOSTRIL EVERY DAY (Patient taking differently: Place 2 sprays into both nostrils daily. ) 16 g 2  . gabapentin (NEURONTIN) 300 MG capsule Take 1 capsule (300 mg total) by mouth 2 (two) times daily. (Patient taking differently: Take 300-600 mg by mouth See admin instructions. Take 300 mg by mouth in the morning and take 600 mg by mouth at  bedtime) 60 capsule 3  . ibuprofen (ADVIL,MOTRIN) 800 MG tablet TAKE 1 TABLET BY MOUTH THREE TIMES A DAY (Patient taking differently: Take 800 mg by mouth 2 (two) times daily. ) 90 tablet 0  . lamoTRIgine (LAMICTAL) 25 MG tablet Take 1 tablet (25 mg total) by mouth 2 (two) times daily. (Patient taking differently: Take 75 mg by mouth daily. ) 60 tablet 0  . loratadine (CLARITIN) 10 MG tablet TAKE 1 TABLET BY MOUTH EVERY DAY (Patient taking differently: Take 10 mg by mouth daily. ) 30 tablet 11  . lurasidone (LATUDA) 40 MG TABS tablet Take 1 tablet (40 mg total) by mouth daily with breakfast. 30 tablet 0  . ondansetron (ZOFRAN) 8 MG tablet Take 1 tablet (8 mg total) by mouth every 8 (eight) hours as needed for nausea or vomiting. 90 tablet 1  . pantoprazole (PROTONIX) 40 MG tablet Take 1 tablet (40 mg total) by mouth daily. 30 tablet 3  . polyethylene glycol (MIRALAX / GLYCOLAX) packet TAKE 17 G BY MOUTH DAILY AS NEEDED FOR MILD CONSTIPATION. 30 packet 0  . traZODone (DESYREL) 100 MG tablet Take 1 tablet (100 mg total) by mouth at bedtime. (Patient taking differently: Take 200 mg by mouth at bedtime. ) 30 tablet 0  . varenicline (CHANTIX) 0.5 MG tablet Take 0.5 mg by mouth daily.     No current facility-administered medications on file prior to visit.     Allergies:  Allergies  Allergen Reactions  . Demerol Itching and Nausea And Vomiting  . Erythromycin Rash   Past Medical History:  Past Medical History:  Diagnosis Date  . Alcohol abuse   . Anemia    during chemo  . Anxiety    At age 4  . Arthritis Dx 2010  . Bipolar disorder ([place])   . Bronchitis   . Cancer ([place])    breast mets to brain  . Chronic pain   . Complication of anesthesia   . Depression   . Family history of adverse reaction to anesthesia    parents had PONV  . Fibromyalgia Dx 2005  . GERD (gastroesophageal reflux disease)   . Headache    hx  migraines  . Lymphedema of left arm   . Opiate dependence ([place])   .  PONV (postoperative nausea and vomiting)   . Port-A-Cath in place   . PTSD (post-traumatic stress disorder)    Past Surgical History:  Past Surgical History:  Procedure Laterality Date  . APPLICATION OF CRANIAL NAVIGATION N/A 08/14/2016   Procedure: APPLICATION OF CRANIAL NAVIGATION;  Surgeon: Erline Levine, MD;  Location: [facility];  Service: Neurosurgery;  Laterality: N/A;  . BREAST RECONSTRUCTION Left    with silicone implant  . CRANIOTOMY N/A 08/14/2016   Procedure: CRANIOTOMY TUMOR EXCISION WITH Lucky Rathke;  Surgeon: Erline Levine, MD;  Location: [facility];  Service: Neurosurgery;  Laterality: N/A;  . FIBULA FRACTURE SURGERY Left   . MASTECTOMY Left   . RADIOLOGY WITH ANESTHESIA N/A 07/23/2016   Procedure: MRI OF BRAIN WITH AND WITHOUT;  Surgeon: Medication Radiologist, MD;  Location: [facility];  Service: Radiology;  Laterality: N/A;  . RADIOLOGY WITH ANESTHESIA N/A 09/08/2016   Procedure: MRI OF BRAIN WITH AND WITHOUT CONTRAST;  Surgeon: Medication Radiologist, MD;  Location: [facility];  Service: Radiology;  Laterality: N/A;  . RADIOLOGY WITH ANESTHESIA N/A 12/10/2016   Procedure: MRI OF BRAIN WITH AND WITHOUT;  Surgeon: Medication Radiologist, MD;  Location: [facility];  Service: Radiology;  Laterality: N/A;  . RADIOLOGY WITH ANESTHESIA N/A 03/02/2017   Procedure: MRI of BRAIN W and W/OUT CONTRAST;  Surgeon: Medication Radiologist, MD;  Location: [facility];  Service: Radiology;  Laterality: N/A;  . RADIOLOGY WITH ANESTHESIA N/A 07/29/2017   Procedure: RADIOLOGY WITH ANESTHESIA MRI OF BRAIN WITH AND WITHOUT CONTRAST;  Surgeon: Radiologist, Medication, MD;  Location: [facility];  Service: Radiology;  Laterality: N/A;  . RADIOLOGY WITH ANESTHESIA N/A 12/07/2017   Procedure: MRI WITH ANESTHESIA OF BRAIN WITH AND WITHOUT CONTRAST;  Surgeon: Radiologist, Medication, MD;  Location: [facility];  Service: Radiology;  Laterality: N/A;  . RADIOLOGY WITH ANESTHESIA N/A 04/07/2018   Procedure: MRI OF BRAIN WITH AND WITHOUT  CONTRAST;  Surgeon: Radiologist, Medication, MD;  Location: [facility];  Service: Radiology;  Laterality: N/A;  . right power port placement Right    Social History:  Social History   Socioeconomic History  . Marital status: Single    Spouse name: Not on file  . Number of children: Not on file  . Years of education: Not on file  . Highest education level: Not on file  Occupational History  . Not on file  Social Needs  . Financial resource strain: Not on file  . Food insecurity:    Worry: Not on file    Inability: Not on file  . Transportation needs:    Medical: Not on file    Non-medical: Not on file  Tobacco Use  .  Smoking status: Current Every Day Smoker    Packs/day: 0.50    Types: Cigarettes  . Smokeless tobacco: Never Used  Substance and Sexual Activity  . Alcohol use: No    Comment: no ETOH since 08/22/12  . Drug use: No    Comment: states she's in recovery program for 5 years  . Sexual activity: Never    Birth control/protection: Other-see comments    Comment: ablation  Lifestyle  . Physical activity:    Days per week: Not on file    Minutes per session: Not on file  . Stress: Not on file  Relationships  . Social connections:    Talks on phone: Not on file    Gets together: Not on file    Attends religious service: Not on file    Active member of club or organization: Not on file    Attends meetings of clubs or organizations: Not on file    Relationship status: Not on file  . Intimate partner violence:    Fear of current or ex partner: Not on file    Emotionally abused: Not on file    Physically abused: Not on file    Forced sexual activity: Not on file  Other Topics Concern  . Not on file  Social History Narrative  . Not on file   Family History:  Family History  Problem Relation Age of Onset  . Diabetes Mother   . Bipolar disorder Mother   . CAD Father     Review of Systems: Constitutional: Denies fevers, chills or abnormal weight loss Eyes:  Denies blurriness of vision Ears, nose, mouth, throat, and face: Denies mucositis or sore throat Respiratory: Denies cough, dyspnea or wheezes Cardiovascular: Denies palpitation, chest discomfort or lower extremity swelling Gastrointestinal:  Denies nausea GU: Denies dysuria or incontinence Skin: Denies abnormal skin rashes Neurological: Per HPI Musculoskeletal: joint pain Behavioral/Psych: +anxiety  Physical Exam: Vitals:   08/24/18 0916  BP: 130/77  Pulse: 97  Resp: 18  Temp: 99 F (37.2 C)  SpO2: 97%   KPS: 90. General: Alert, cooperative, pleasant, in no acute distress Head: Normal EENT: No conjunctival injection or scleral icterus. Oral mucosa moist Lungs: Resp effort normal Cardiac: Regular rate and rhythm Abdomen: Soft, non-distended abdomen Skin: No rashes cyanosis or petechiae. Extremities: No clubbing or edema  Neurologic Exam: Mental Status: Awake, alert, attentive to examiner. Oriented to self and environment. Language is fluent with intact comprehension.  Cranial Nerves: Visual acuity is grossly normal. Visual fields are full. Extra-ocular movements intact. No ptosis. Face is symmetric, tongue midline. Motor: Tone and bulk are normal. Power is full in both arms and legs. Reflexes are symmetric, no pathologic reflexes present.  Coordination: Mild dysmetria on finger to nose bilaterally.   Sensory: Intact to light touch and temperature Gait: Normal and tandem gait is normal.   Labs: I have reviewed the data as listed    Component Value Date/Time   NA 141 08/23/2018 0634   NA 140 10/26/2017 1038   K 3.8 08/23/2018 0634   K 3.8 10/26/2017 1038   CL 105 08/23/2018 0634   CO2 27 08/23/2018 0634   CO2 24 10/26/2017 1038   GLUCOSE 111 (H) 08/23/2018 0634   GLUCOSE 158 (H) 10/26/2017 1038   BUN 9 08/23/2018 0634   BUN 12.0 10/26/2017 1038   CREATININE 1.05 (H) 08/23/2018 0634   CREATININE 0.8 10/26/2017 1038   CALCIUM 9.0 08/23/2018 0634   CALCIUM 8.7  10/26/2017 1038     PROT 7.0 08/16/2018 0826   PROT 6.1 (L) 10/26/2017 1038   ALBUMIN 4.0 08/16/2018 0826   ALBUMIN 3.7 10/26/2017 1038   AST 14 (L) 08/16/2018 0826   AST 35 (H) 10/26/2017 1038   ALT 11 08/16/2018 0826   ALT 48 10/26/2017 1038   ALKPHOS 66 08/16/2018 0826   ALKPHOS 34 (L) 10/26/2017 1038   BILITOT 0.3 08/16/2018 0826   BILITOT <0.22 10/26/2017 1038   GFRNONAA 58 (L) 08/23/2018 0634   GFRAA >60 08/23/2018 0634   Lab Results  Component Value Date   WBC 3.4 (L) 08/23/2018   NEUTROABS 2.7 08/16/2018   HGB 11.9 (L) 08/23/2018   HCT 37.1 08/23/2018   MCV 95.6 08/23/2018   PLT 174 08/23/2018    Imaging:  [facility] Clinician Interpretation: I have personally reviewed the CNS images as listed.  My interpretation, in the context of the patient's clinical presentation, is stable disease  Mr Brain W Wo Contrast  Result Date: 08/23/2018 CLINICAL DATA:  Follow-up breast cancer metastases. History of whole-brain radiotherapy EXAM: MRI HEAD WITHOUT AND WITH CONTRAST TECHNIQUE: Multiplanar, multiecho pulse sequences of the brain and surrounding structures were obtained without and with intravenous contrast. CONTRAST:  7 cc Gadavist COMPARISON:  04/07/2018 FINDINGS: Brain: No parenchymal or meningeal enhancing foci to suggest metastatic disease. Tiny focus of sulcal enhancement in the inferior right temporal lobe on 10:53 is stable and likely a vessel based on prior. There is sequela of whole-brain radiotherapy with confluent FLAIR hyperintensity in the cerebral white matter. Sequela of left occipital mass resection. No interval infarct, hemorrhage, hydrocephalus, or collection. Vascular: Major flow voids and vascular enhancements are preserved. Skull and upper cervical spine: No evident metastasis. Sinuses/Orbits: Negative IMPRESSION: Stable post treatment brain.  No evidence of metastatic disease. Electronically Signed   By: Jonathon  Watts M.D.   On: 08/23/2018 16:28      Assessment/Plan  1. Brain metastasis ([facility]) 2. Chronic nonintractable headache, unspecified headache type  [name] is clinically and radiographically stable at this time.  We recommend repeating an MRI brain in 5 months for continued surveillance.  She is due for re-staging of breast cancer in November, will continue to follow with Dr. Magrinat.  We appreciate the opportunity to participate in the care of [name].   All questions were answered. The patient knows to call the clinic with any problems, questions or concerns. No barriers to learning were detected.  The total time spent in the appointment was 25 minutes and more than 50% was on counseling and review of test results    K , MD Medical Director of Neuro-Oncology [facility] at [place] 08/24/18 9:08 AM 

## 2018-08-25 ENCOUNTER — Telehealth: Payer: Self-pay

## 2018-08-25 NOTE — Telephone Encounter (Signed)
Left a message concerning a new appointment added to the patient schedule, and to still come to her other scheduled appointment . Per 9/25 los

## 2018-08-26 NOTE — Progress Notes (Signed)
Brain and Spine Tumor Board Documentation  Aster Screws was presented by Cecil Cobbs, MD at Brain and Spine Tumor Board on 08/26/2018, which included representatives from neuro oncology, radiation oncology, surgical oncology, radiology, pathology, navigation.  Adlean was presented as a current patient with history of the following treatments:  .  Additionally, we reviewed previous medical and familial history, history of present illness, and recent lab results along with all available histopathologic and imaging studies. The tumor board considered available treatment options and made the following recommendations:  Active surveillance  Tumor board is a meeting of clinicians from various specialty areas who evaluate and discuss patients for whom a multidisciplinary approach is being considered. Final determinations in the plan of care are those of the provider(s). The responsibility for follow up of recommendations given during tumor board is that of the provider.   Today's extended care, comprehensive team conference, Kiesha was not present for the discussion and was not examined.

## 2018-08-28 NOTE — H&P (Signed)
Inappropriate request.

## 2018-08-29 ENCOUNTER — Other Ambulatory Visit: Payer: Self-pay | Admitting: Family Medicine

## 2018-08-29 DIAGNOSIS — M62838 Other muscle spasm: Secondary | ICD-10-CM

## 2018-08-29 DIAGNOSIS — G8929 Other chronic pain: Secondary | ICD-10-CM

## 2018-08-29 DIAGNOSIS — R519 Headache, unspecified: Secondary | ICD-10-CM

## 2018-08-29 DIAGNOSIS — R51 Headache: Principal | ICD-10-CM

## 2018-09-06 ENCOUNTER — Other Ambulatory Visit: Payer: Self-pay | Admitting: Family Medicine

## 2018-09-07 ENCOUNTER — Other Ambulatory Visit: Payer: Self-pay | Admitting: Oncology

## 2018-09-10 ENCOUNTER — Other Ambulatory Visit: Payer: Self-pay | Admitting: Family Medicine

## 2018-09-10 DIAGNOSIS — R519 Headache, unspecified: Secondary | ICD-10-CM

## 2018-09-10 DIAGNOSIS — M62838 Other muscle spasm: Secondary | ICD-10-CM

## 2018-09-10 DIAGNOSIS — R51 Headache: Principal | ICD-10-CM

## 2018-09-10 DIAGNOSIS — G8929 Other chronic pain: Secondary | ICD-10-CM

## 2018-09-12 ENCOUNTER — Other Ambulatory Visit: Payer: Self-pay | Admitting: Oncology

## 2018-09-13 ENCOUNTER — Inpatient Hospital Stay: Payer: Medicaid Other

## 2018-09-13 ENCOUNTER — Other Ambulatory Visit: Payer: Self-pay | Admitting: Oncology

## 2018-09-13 ENCOUNTER — Inpatient Hospital Stay: Payer: Medicaid Other | Attending: Medical

## 2018-09-13 VITALS — BP 116/77 | HR 93 | Temp 98.0°F | Resp 18

## 2018-09-13 DIAGNOSIS — C50312 Malignant neoplasm of lower-inner quadrant of left female breast: Secondary | ICD-10-CM

## 2018-09-13 DIAGNOSIS — Z5112 Encounter for antineoplastic immunotherapy: Secondary | ICD-10-CM | POA: Diagnosis present

## 2018-09-13 DIAGNOSIS — M62838 Other muscle spasm: Secondary | ICD-10-CM

## 2018-09-13 DIAGNOSIS — C7951 Secondary malignant neoplasm of bone: Secondary | ICD-10-CM | POA: Diagnosis not present

## 2018-09-13 DIAGNOSIS — C7931 Secondary malignant neoplasm of brain: Secondary | ICD-10-CM | POA: Diagnosis not present

## 2018-09-13 DIAGNOSIS — G8929 Other chronic pain: Secondary | ICD-10-CM

## 2018-09-13 DIAGNOSIS — R51 Headache: Principal | ICD-10-CM

## 2018-09-13 DIAGNOSIS — R519 Headache, unspecified: Secondary | ICD-10-CM

## 2018-09-13 LAB — CBC WITH DIFFERENTIAL/PLATELET
Abs Immature Granulocytes: 0.01 10*3/uL (ref 0.00–0.07)
Basophils Absolute: 0 10*3/uL (ref 0.0–0.1)
Basophils Relative: 1 %
Eosinophils Absolute: 0 10*3/uL (ref 0.0–0.5)
Eosinophils Relative: 1 %
HCT: 39.8 % (ref 36.0–46.0)
Hemoglobin: 13.5 g/dL (ref 12.0–15.0)
Immature Granulocytes: 0 %
Lymphocytes Relative: 26 %
Lymphs Abs: 1 10*3/uL (ref 0.7–4.0)
MCH: 31.3 pg (ref 26.0–34.0)
MCHC: 33.9 g/dL (ref 30.0–36.0)
MCV: 92.1 fL (ref 80.0–100.0)
Monocytes Absolute: 0.3 10*3/uL (ref 0.1–1.0)
Monocytes Relative: 9 %
Neutro Abs: 2.5 10*3/uL (ref 1.7–7.7)
Neutrophils Relative %: 63 %
Platelets: 180 10*3/uL (ref 150–400)
RBC: 4.32 MIL/uL (ref 3.87–5.11)
RDW: 12.4 % (ref 11.5–15.5)
WBC: 3.9 10*3/uL — ABNORMAL LOW (ref 4.0–10.5)
nRBC: 0 % (ref 0.0–0.2)

## 2018-09-13 LAB — COMPREHENSIVE METABOLIC PANEL
ALT: 15 U/L (ref 0–44)
AST: 17 U/L (ref 15–41)
Albumin: 4 g/dL (ref 3.5–5.0)
Alkaline Phosphatase: 78 U/L (ref 38–126)
Anion gap: 11 (ref 5–15)
BUN: 14 mg/dL (ref 6–20)
CO2: 27 mmol/L (ref 22–32)
Calcium: 10 mg/dL (ref 8.9–10.3)
Chloride: 101 mmol/L (ref 98–111)
Creatinine, Ser: 1.22 mg/dL — ABNORMAL HIGH (ref 0.44–1.00)
GFR calc Af Amer: 56 mL/min — ABNORMAL LOW (ref >60)
GFR calc non Af Amer: 48 mL/min — ABNORMAL LOW (ref >60)
Glucose, Bld: 114 mg/dL — ABNORMAL HIGH (ref 70–99)
Potassium: 4.1 mmol/L (ref 3.5–5.1)
Sodium: 139 mmol/L (ref 135–145)
Total Bilirubin: 0.3 mg/dL (ref 0.3–1.2)
Total Protein: 7.3 g/dL (ref 6.5–8.1)

## 2018-09-13 MED ORDER — DIPHENHYDRAMINE HCL 25 MG PO CAPS: 25.0000 mg | ORAL_CAPSULE | Freq: Once | ORAL | Status: DC

## 2018-09-13 MED ORDER — HEPARIN SOD (PORK) LOCK FLUSH 100 UNIT/ML IV SOLN
500.0000 [IU] | Freq: Once | INTRAVENOUS | Status: AC | PRN
  Administered 2018-09-13: 500 [IU]
  Filled 2018-09-13: qty 5

## 2018-09-13 MED ORDER — ACETAMINOPHEN 325 MG PO TABS: 650.0000 mg | ORAL_TABLET | Freq: Once | ORAL | Status: DC

## 2018-09-13 MED ORDER — SODIUM CHLORIDE 0.9% FLUSH
10.0000 mL | INTRAVENOUS | Status: DC | PRN
  Administered 2018-09-13: 10 mL
  Filled 2018-09-13: qty 10

## 2018-09-13 MED ORDER — SODIUM CHLORIDE 0.9 % IV SOLN
Freq: Once | INTRAVENOUS | Status: AC
  Administered 2018-09-13: 11:00:00 via INTRAVENOUS
  Filled 2018-09-13: qty 250

## 2018-09-13 MED ORDER — TRASTUZUMAB CHEMO 150 MG IV SOLR
450.0000 mg | Freq: Once | INTRAVENOUS | Status: AC
  Administered 2018-09-13: 450 mg via INTRAVENOUS
  Filled 2018-09-13: qty 21.4

## 2018-09-13 MED ORDER — CYCLOBENZAPRINE HCL 5 MG PO TABS: ORAL_TABLET | ORAL | 0 refills | Status: DC

## 2018-09-13 MED ORDER — DENOSUMAB 120 MG/1.7ML ~~LOC~~ SOLN: 120.0000 mg | Freq: Once | SUBCUTANEOUS | Status: DC

## 2018-09-13 NOTE — Patient Instructions (Signed)
[facility] Discharge Instructions for Patients Receiving Chemotherapy  Today you received the following chemotherapy agents: Trastuzumab (Herceptin)  To help prevent nausea and vomiting after your treatment, we encourage you to take your nausea medication as directed.    If you develop nausea and vomiting that is not controlled by your nausea medication, call the clinic.   BELOW ARE SYMPTOMS THAT SHOULD BE REPORTED IMMEDIATELY:  *FEVER GREATER THAN 100.5 F  *CHILLS WITH OR WITHOUT FEVER  NAUSEA AND VOMITING THAT IS NOT CONTROLLED WITH YOUR NAUSEA MEDICATION  *UNUSUAL SHORTNESS OF BREATH  *UNUSUAL BRUISING OR BLEEDING  TENDERNESS IN MOUTH AND THROAT WITH OR WITHOUT PRESENCE OF ULCERS  *URINARY PROBLEMS  *BOWEL PROBLEMS  UNUSUAL RASH Items with * indicate a potential emergency and should be followed up as soon as possible.  Feel free to call the clinic should you have any questions or concerns. The clinic phone number is [phone].  Please show the CHEMO ALERT CARD at check-in to the Emergency Department and triage nurse.    

## 2018-09-14 LAB — CANCER ANTIGEN 27.29: CA 27.29: 10.7 U/mL (ref 0.0–38.6)

## 2018-09-17 ENCOUNTER — Other Ambulatory Visit: Payer: Self-pay | Admitting: Family Medicine

## 2018-09-17 DIAGNOSIS — R519 Headache, unspecified: Secondary | ICD-10-CM

## 2018-09-17 DIAGNOSIS — R51 Headache: Principal | ICD-10-CM

## 2018-09-17 DIAGNOSIS — M62838 Other muscle spasm: Secondary | ICD-10-CM

## 2018-09-17 DIAGNOSIS — G8929 Other chronic pain: Secondary | ICD-10-CM

## 2018-09-20 ENCOUNTER — Other Ambulatory Visit: Payer: Self-pay | Admitting: *Deleted

## 2018-09-20 MED ORDER — ONDANSETRON HCL 8 MG PO TABS: 8.0000 mg | ORAL_TABLET | Freq: Three times a day (TID) | ORAL | 1 refills | Status: DC | PRN

## 2018-09-20 NOTE — Telephone Encounter (Signed)
Pt called this am for refill on her zofran stating that pharmacy asked her to call.  Refill sent to local pharmacy & pt notified.

## 2018-09-22 ENCOUNTER — Other Ambulatory Visit: Payer: Self-pay | Admitting: Family Medicine

## 2018-09-22 DIAGNOSIS — K219 Gastro-esophageal reflux disease without esophagitis: Secondary | ICD-10-CM

## 2018-09-25 ENCOUNTER — Other Ambulatory Visit: Payer: Self-pay | Admitting: Oncology

## 2018-09-25 DIAGNOSIS — R51 Headache: Principal | ICD-10-CM

## 2018-09-25 DIAGNOSIS — R519 Headache, unspecified: Secondary | ICD-10-CM

## 2018-09-25 DIAGNOSIS — G8929 Other chronic pain: Secondary | ICD-10-CM

## 2018-09-25 DIAGNOSIS — M62838 Other muscle spasm: Secondary | ICD-10-CM

## 2018-09-28 ENCOUNTER — Other Ambulatory Visit: Payer: Self-pay | Admitting: Family Medicine

## 2018-09-28 DIAGNOSIS — K219 Gastro-esophageal reflux disease without esophagitis: Secondary | ICD-10-CM

## 2018-10-03 ENCOUNTER — Telehealth: Payer: Self-pay | Admitting: Medical Oncology

## 2018-10-03 NOTE — Telephone Encounter (Signed)
L shoulder pain with and without pressure. Pain presented 7 days ago. Denies injury.  Pain is not getting any better with ibuprofen or flexeril. On herceptin and xgeva.  She has lymphedma in same arm. "Do you think I have bone cancer?"  She sees Dr Collene Mares for PAP on  wed . I told her not to be too concerned at this point and I will ask Magrinat to advise. She sees Ria Comment on 10/11/18.

## 2018-10-03 NOTE — Telephone Encounter (Signed)
Pt called back and requests [facility] appt. Scheduled for tomorrow and pt notified.

## 2018-10-03 NOTE — Telephone Encounter (Signed)
Per Dr [name] I instructed pt to take aleve 220 mg and tylenol 500 mg tid with food . I offered [facility] appt ( per Magrinat)  , pt declined and will talk to her PCP about it on wed. I told her to call back if she wants an appt.

## 2018-10-04 ENCOUNTER — Inpatient Hospital Stay: Payer: Medicaid Other | Attending: Medical | Admitting: Medical

## 2018-10-04 ENCOUNTER — Ambulatory Visit (HOSPITAL_COMMUNITY)
Admission: RE | Admit: 2018-10-04 | Discharge: 2018-10-04 | Disposition: A | Discharge status: Home / Self Care | Payer: Medicaid Other | Source: Ambulatory Visit | Attending: Medical | Admitting: Medical

## 2018-10-04 VITALS — BP 128/89 | HR 89 | Temp 98.2°F | Resp 18 | Ht 65.0 in | Wt 170.4 lb

## 2018-10-04 DIAGNOSIS — C7951 Secondary malignant neoplasm of bone: Secondary | ICD-10-CM | POA: Insufficient documentation

## 2018-10-04 DIAGNOSIS — M79602 Pain in left arm: Secondary | ICD-10-CM

## 2018-10-04 DIAGNOSIS — C7931 Secondary malignant neoplasm of brain: Secondary | ICD-10-CM | POA: Diagnosis not present

## 2018-10-04 DIAGNOSIS — I890 Lymphedema, not elsewhere classified: Secondary | ICD-10-CM | POA: Insufficient documentation

## 2018-10-04 DIAGNOSIS — Z5112 Encounter for antineoplastic immunotherapy: Secondary | ICD-10-CM | POA: Diagnosis present

## 2018-10-04 DIAGNOSIS — C50312 Malignant neoplasm of lower-inner quadrant of left female breast: Secondary | ICD-10-CM | POA: Insufficient documentation

## 2018-10-04 DIAGNOSIS — M25512 Pain in left shoulder: Secondary | ICD-10-CM | POA: Insufficient documentation

## 2018-10-04 DIAGNOSIS — Z170 Estrogen receptor positive status [ER+]: Secondary | ICD-10-CM | POA: Diagnosis not present

## 2018-10-04 NOTE — Patient Instructions (Signed)
Implanted Port Home Guide An implanted port is a type of central line that is placed under the skin. Central lines are used to provide IV access when treatment or nutrition needs to be given through a person's veins. Implanted ports are used for long-term IV access. An implanted port may be placed because:  You need IV medicine that would be irritating to the small veins in your hands or arms.  You need long-term IV medicines, such as antibiotics.  You need IV nutrition for a long period.  You need frequent blood draws for lab tests.  You need dialysis.  Implanted ports are usually placed in the chest area, but they can also be placed in the upper arm, the abdomen, or the leg. An implanted port has two main parts:  Reservoir. The reservoir is round and will appear as a small, raised area under your skin. The reservoir is the part where a needle is inserted to give medicines or draw blood.  Catheter. The catheter is a thin, flexible tube that extends from the reservoir. The catheter is placed into a large vein. Medicine that is inserted into the reservoir goes into the catheter and then into the vein.  How will I care for my incision site? Do not get the incision site wet. Bathe or shower as directed by your health care provider. How is my port accessed? Special steps must be taken to access the port:  Before the port is accessed, a numbing cream can be placed on the skin. This helps numb the skin over the port site.  Your health care provider uses a sterile technique to access the port. ? Your health care provider must put on a mask and sterile gloves. ? The skin over your port is cleaned carefully with an antiseptic and allowed to dry. ? The port is gently pinched between sterile gloves, and a needle is inserted into the port.  Only "non-coring" port needles should be used to access the port. Once the port is accessed, a blood return should be checked. This helps ensure that the port  is in the vein and is not clogged.  If your port needs to remain accessed for a constant infusion, a clear (transparent) bandage will be placed over the needle site. The bandage and needle will need to be changed every week, or as directed by your health care provider.  Keep the bandage covering the needle clean and dry. Do not get it wet. Follow your health care provider's instructions on how to take a shower or bath while the port is accessed.  If your port does not need to stay accessed, no bandage is needed over the port.  What is flushing? Flushing helps keep the port from getting clogged. Follow your health care provider's instructions on how and when to flush the port. Ports are usually flushed with saline solution or a medicine called heparin. The need for flushing will depend on how the port is used.  If the port is used for intermittent medicines or blood draws, the port will need to be flushed: ? After medicines have been given. ? After blood has been drawn. ? As part of routine maintenance.  If a constant infusion is running, the port may not need to be flushed.  How long will my port stay implanted? The port can stay in for as long as your health care provider thinks it is needed. When it is time for the port to come out, surgery will be   done to remove it. The procedure is similar to the one performed when the port was put in. When should I seek immediate medical care? When you have an implanted port, you should seek immediate medical care if:  You notice a bad smell coming from the incision site.  You have swelling, redness, or drainage at the incision site.  You have more swelling or pain at the port site or the surrounding area.  You have a fever that is not controlled with medicine.  This information is not intended to replace advice given to you by your health care provider. Make sure you discuss any questions you have with your health care provider. Document  Released: 11/16/2005 Document Revised: 04/23/2016 Document Reviewed: 07/24/2013 Elsevier Interactive Patient Education  2017 [employer]  

## 2018-10-05 ENCOUNTER — Telehealth: Payer: Self-pay | Admitting: Medical

## 2018-10-05 ENCOUNTER — Ambulatory Visit: Payer: Medicaid Other | Attending: Family Medicine | Admitting: Family Medicine

## 2018-10-05 ENCOUNTER — Other Ambulatory Visit (HOSPITAL_COMMUNITY)
Admission: RE | Admit: 2018-10-05 | Discharge: 2018-10-05 | Disposition: A | Discharge status: Home / Self Care | Payer: Medicaid Other | Source: Ambulatory Visit | Attending: Family Medicine | Admitting: Family Medicine

## 2018-10-05 ENCOUNTER — Encounter: Payer: Self-pay | Admitting: Family Medicine

## 2018-10-05 VITALS — BP 127/79 | HR 98 | Temp 98.2°F | Ht 65.0 in | Wt 170.6 lb

## 2018-10-05 DIAGNOSIS — Z79899 Other long term (current) drug therapy: Secondary | ICD-10-CM | POA: Insufficient documentation

## 2018-10-05 DIAGNOSIS — B9689 Other specified bacterial agents as the cause of diseases classified elsewhere: Secondary | ICD-10-CM | POA: Insufficient documentation

## 2018-10-05 DIAGNOSIS — C50312 Malignant neoplasm of lower-inner quadrant of left female breast: Secondary | ICD-10-CM | POA: Insufficient documentation

## 2018-10-05 DIAGNOSIS — C7931 Secondary malignant neoplasm of brain: Secondary | ICD-10-CM | POA: Diagnosis not present

## 2018-10-05 DIAGNOSIS — Z885 Allergy status to narcotic agent status: Secondary | ICD-10-CM | POA: Diagnosis not present

## 2018-10-05 DIAGNOSIS — Z124 Encounter for screening for malignant neoplasm of cervix: Secondary | ICD-10-CM | POA: Diagnosis present

## 2018-10-05 DIAGNOSIS — Z923 Personal history of irradiation: Secondary | ICD-10-CM | POA: Insufficient documentation

## 2018-10-05 DIAGNOSIS — Z881 Allergy status to other antibiotic agents status: Secondary | ICD-10-CM | POA: Diagnosis not present

## 2018-10-05 DIAGNOSIS — G8929 Other chronic pain: Secondary | ICD-10-CM

## 2018-10-05 DIAGNOSIS — F4310 Post-traumatic stress disorder, unspecified: Secondary | ICD-10-CM | POA: Diagnosis not present

## 2018-10-05 DIAGNOSIS — Z01411 Encounter for gynecological examination (general) (routine) with abnormal findings: Secondary | ICD-10-CM | POA: Diagnosis present

## 2018-10-05 DIAGNOSIS — K219 Gastro-esophageal reflux disease without esophagitis: Secondary | ICD-10-CM | POA: Diagnosis not present

## 2018-10-05 DIAGNOSIS — R519 Headache, unspecified: Secondary | ICD-10-CM

## 2018-10-05 DIAGNOSIS — Z9012 Acquired absence of left breast and nipple: Secondary | ICD-10-CM | POA: Insufficient documentation

## 2018-10-05 DIAGNOSIS — M62838 Other muscle spasm: Secondary | ICD-10-CM | POA: Insufficient documentation

## 2018-10-05 DIAGNOSIS — R51 Headache: Secondary | ICD-10-CM | POA: Insufficient documentation

## 2018-10-05 DIAGNOSIS — F319 Bipolar disorder, unspecified: Secondary | ICD-10-CM | POA: Diagnosis not present

## 2018-10-05 DIAGNOSIS — N760 Acute vaginitis: Secondary | ICD-10-CM | POA: Insufficient documentation

## 2018-10-05 DIAGNOSIS — Z87891 Personal history of nicotine dependence: Secondary | ICD-10-CM | POA: Insufficient documentation

## 2018-10-05 MED ORDER — CYCLOBENZAPRINE HCL 5 MG PO TABS: 5.0000 mg | ORAL_TABLET | Freq: Two times a day (BID) | ORAL | 2 refills | Status: DC | PRN

## 2018-10-05 MED ORDER — PANTOPRAZOLE SODIUM 40 MG PO TBEC: 40.0000 mg | DELAYED_RELEASE_TABLET | Freq: Every day | ORAL | 2 refills | Status: DC

## 2018-10-05 MED ORDER — METRONIDAZOLE 0.75 % VA GEL: 1.0000 | Freq: Every day | VAGINAL | 0 refills | Status: DC

## 2018-10-05 NOTE — Progress Notes (Signed)
Subjective:  Patient ID: [name], female    DOB: 08-31-1961  Age: 56 y.o. 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  Last seen at the oncology clinic yesterday. She presents for Pap smear today.  She has noticed a fishy odor in her genitalia but denies vaginal discharge or itching. She would also like refills on her chronic medications. Endorses fatigue which starts at about 3 PM throughout the rest of the day but she states mornings are her best times.  Past Medical History:  Diagnosis Date  . Alcohol abuse   . Anemia    during chemo  . Anxiety    At age 82  . Arthritis Dx 2010  . Bipolar disorder ([place])   . Bronchitis   . Cancer ([place])    breast mets to brain  . Chronic pain   . Complication of anesthesia   . Depression   . Family history of adverse reaction to anesthesia    parents had PONV  . Fibromyalgia Dx 2005  . GERD (gastroesophageal reflux disease)   . Headache    hx  migraines  . Lymphedema of left arm   . Opiate dependence ([place])   . PONV (postoperative nausea and vomiting)   . Port-A-Cath in place   . PTSD (post-traumatic stress disorder)     Past Surgical History:  Procedure Laterality Date  . APPLICATION OF CRANIAL NAVIGATION N/A 08/14/2016   Procedure: APPLICATION OF CRANIAL NAVIGATION;  Surgeon: Erline Levine, MD;  Location: [place] [facility];  Service: Neurosurgery;  Laterality: N/A;  . BREAST RECONSTRUCTION Left    with silicone implant  . CRANIOTOMY N/A 08/14/2016   Procedure: CRANIOTOMY TUMOR EXCISION WITH Lucky Rathke;   Surgeon: Erline Levine, MD;  Location: [place] [facility];  Service: Neurosurgery;  Laterality: N/A;  . FIBULA FRACTURE SURGERY Left   . MASTECTOMY Left   . RADIOLOGY WITH ANESTHESIA N/A 07/23/2016   Procedure: MRI OF BRAIN WITH AND WITHOUT;  Surgeon: Medication Radiologist, MD;  Location: [place];  Service: Radiology;  Laterality: N/A;  . RADIOLOGY WITH ANESTHESIA N/A 09/08/2016   Procedure: MRI OF BRAIN WITH AND WITHOUT CONTRAST;  Surgeon: Medication Radiologist, MD;  Location: [place];  Service: Radiology;  Laterality: N/A;  . RADIOLOGY WITH ANESTHESIA N/A 12/10/2016   Procedure: MRI OF BRAIN WITH AND WITHOUT;  Surgeon: Medication Radiologist, MD;  Location: [place];  Service: Radiology;  Laterality: N/A;  . RADIOLOGY WITH ANESTHESIA N/A 03/02/2017   Procedure: MRI of BRAIN W and W/OUT CONTRAST;  Surgeon: Medication Radiologist, MD;  Location: [place];  Service: Radiology;  Laterality: N/A;  . RADIOLOGY WITH ANESTHESIA N/A 07/29/2017   Procedure: RADIOLOGY WITH ANESTHESIA MRI OF BRAIN WITH AND WITHOUT CONTRAST;  Surgeon: Radiologist, Medication, MD;  Location: [place];  Service: Radiology;  Laterality: N/A;  . RADIOLOGY WITH ANESTHESIA N/A 12/07/2017   Procedure: MRI WITH ANESTHESIA OF BRAIN WITH AND WITHOUT CONTRAST;  Surgeon: Radiologist, Medication, MD;  Location: [place];  Service: Radiology;  Laterality: N/A;  . RADIOLOGY WITH ANESTHESIA N/A 04/07/2018   Procedure: MRI OF BRAIN WITH AND WITHOUT CONTRAST;  Surgeon: Radiologist, Medication,  MD;  Location: [place];  Service: Radiology;  Laterality: N/A;  . RADIOLOGY WITH ANESTHESIA N/A 08/23/2018   Procedure: MRI WITH ANESTHESIA OF THE BRAIN WITH AND WITHOUT;  Surgeon: Radiologist, Medication, MD;  Location: [place];  Service: Radiology;  Laterality: N/A;  . right power port placement Right     Allergies  Allergen Reactions  . Demerol Itching and Nausea And Vomiting  . Erythromycin Rash     Outpatient Medications Prior to Visit  Medication Sig Dispense Refill  .  anastrozole (ARIMIDEX) 1 MG tablet TAKE 1 TABLET BY MOUTH EVERY DAY 90 tablet 1  . Biotin 1000 MCG tablet Take 1,000 mcg by mouth daily.    . Cholecalciferol (VITAMIN D3) 5000 units CAPS Take 5,000 Units by mouth daily.    [name] docusate sodium (COLACE) 100 MG capsule Take 100 mg by mouth daily as needed for mild constipation.     . fluticasone (FLONASE) 50 MCG/ACT nasal spray SPRAY 2 SPRAYS INTO EACH NOSTRIL EVERY DAY (Patient taking differently: Place 2 sprays into both nostrils daily. ) 16 g 2  . gabapentin (NEURONTIN) 300 MG capsule Take 1 capsule (300 mg total) by mouth 2 (two) times daily. (Patient taking differently: Take 300-600 mg by mouth See admin instructions. Take 300 mg by mouth in the morning and take 600 mg by mouth at bedtime) 60 capsule 3  . ibuprofen (ADVIL,MOTRIN) 800 MG tablet TAKE 1 TABLET BY MOUTH THREE TIMES A DAY 90 tablet 0  . lamoTRIgine (LAMICTAL) 25 MG tablet Take 1 tablet (25 mg total) by mouth 2 (two) times daily. (Patient taking differently: Take 75 mg by mouth daily. ) 60 tablet 0  . loratadine (CLARITIN) 10 MG tablet TAKE 1 TABLET BY MOUTH EVERY DAY (Patient taking differently: Take 10 mg by mouth daily. ) 30 tablet 11  . lurasidone (LATUDA) 40 MG TABS tablet Take 1 tablet (40 mg total) by mouth daily with breakfast. 30 tablet 0  . ondansetron (ZOFRAN) 8 MG tablet Take 1 tablet (8 mg total) by mouth every 8 (eight) hours as needed for nausea or vomiting. 90 tablet 1  . polyethylene glycol (MIRALAX / GLYCOLAX) packet TAKE 17 G BY MOUTH DAILY AS NEEDED FOR MILD CONSTIPATION. 30 packet 0  . traZODone (DESYREL) 100 MG tablet Take 1 tablet (100 mg total) by mouth at bedtime. (Patient taking differently: Take 200 mg by mouth at bedtime. ) 30 tablet 0  . varenicline (CHANTIX) 0.5 MG tablet Take 0.5 mg by mouth daily.    . cyclobenzaprine (FLEXERIL) 5 MG tablet TAKE 2 TABLET BY MOUTH TWICE A DAY AS NEEDED FOR MUSCLE SPASMS 60 tablet 0  . pantoprazole (PROTONIX) 40 MG tablet TAKE  1 TABLET BY MOUTH EVERY DAY 30 tablet 02   No facility-administered medications prior to visit.     ROS Review of Systems  Constitutional: Positive for fatigue. Negative for activity change and appetite change.  HENT: Negative for congestion, sinus pressure and sore throat.   Eyes: Negative for visual disturbance.  Respiratory: Negative for cough, chest tightness, shortness of breath and wheezing.   Cardiovascular: Negative for chest pain and palpitations.  Gastrointestinal: Negative for abdominal distention, abdominal pain and constipation.  Endocrine: Negative for polydipsia.  Genitourinary: Negative for dysuria, frequency and vaginal discharge.  Musculoskeletal: Negative for arthralgias and back pain.  Skin: Negative for rash.  Neurological: Negative for tremors, light-headedness and numbness.  Hematological: Does not bruise/bleed easily.  Psychiatric/Behavioral: Negative for agitation and behavioral problems.  Objective:  BP 127/79   Pulse 98   Temp 98.2 F (36.8 C) (Oral)   Ht 5\' 5"  (1.651 m)   Wt 170 lb 9.6 oz (77.4 kg)   SpO2 99%   BMI 28.39 kg/m   BP/Weight 10/05/2018 10/04/2018 09/23/8526  Systolic BP 782 423 536  Diastolic BP 79 89 77  Wt. (Lbs) 170.6 170.4 -  BMI 28.39 28.36 -  Some encounter information is confidential and restricted. Go to Review Flowsheets activity to see all data.      Physical Exam  Constitutional: She is oriented to person, place, and time. She appears well-developed and well-nourished.  Cardiovascular: Normal rate, normal heart sounds and intact distal pulses.  No murmur heard. Pulmonary/Chest: Effort normal and breath sounds normal. She has no wheezes. She has no rales. She exhibits no tenderness.  Abdominal: Soft. Bowel sounds are normal. She exhibits no distension and no mass. There is no tenderness.  Genitourinary:  Genitourinary Comments: External genitalia, vagina, cervix, adnexa-normal  Musculoskeletal: Normal range of  motion.  Neurological: She is alert and oriented to person, place, and time.  Skin: Skin is warm and dry.  Psychiatric: She has a normal mood and affect.     Assessment & Plan:   1. Gastroesophageal reflux disease without esophagitis Controlled - pantoprazole (PROTONIX) 40 MG tablet; Take 1 tablet (40 mg total) by mouth daily.  Dispense: 30 tablet; Refill: 2  2. Chronic nonintractable headache, unspecified headache type She does have underlying muscle spasm and uses Flexeril intermittently - cyclobenzaprine (FLEXERIL) 5 MG tablet; Take 1 tablet (5 mg total) by mouth 2 (two) times daily as needed for muscle spasms.  Dispense: 60 tablet; Refill: 2  3. Muscle spasm Stable - cyclobenzaprine (FLEXERIL) 5 MG tablet; Take 1 tablet (5 mg total) by mouth 2 (two) times daily as needed for muscle spasms.  Dispense: 60 tablet; Refill: 2  4. Bacterial vaginosis Placed on MetroGel  5. Screening for cervical cancer - Cytology - PAP([place])  6. Primary cancer of lower-inner quadrant of left female breast [facility]) With brain metastasis status post left mastectomy and axillary node dissection Currently on Anastrozole, Denosumab and Trastuzumab.  Meds ordered this encounter  Medications  . metroNIDAZOLE (METROGEL VAGINAL) 0.75 % vaginal gel    Sig: Place 1 Applicatorful vaginally at bedtime.    Dispense:  70 g    Refill:  0  . pantoprazole (PROTONIX) 40 MG tablet    Sig: Take 1 tablet (40 mg total) by mouth daily.    Dispense:  30 tablet    Refill:  2  . cyclobenzaprine (FLEXERIL) 5 MG tablet    Sig: Take 1 tablet (5 mg total) by mouth 2 (two) times daily as needed for muscle spasms.    Dispense:  60 tablet    Refill:  2    Follow-up: Return in about 6 months (around 04/05/2019) for follow up of chronic medical conditions.   Charlott Rakes MD

## 2018-10-05 NOTE — Telephone Encounter (Signed)
No los per 11/5. °

## 2018-10-06 NOTE — Progress Notes (Signed)
These results were called to [facility]. A message was left for her. she was told to call if there were questions.  Sandi Mealy, MHS, PA-C

## 2018-10-06 NOTE — Progress Notes (Signed)
These results were called to [facility]. A message was left for her. she was told to call if there were questions.  Sandi Mealy, MHS, PA-C

## 2018-10-06 NOTE — Progress Notes (Signed)
These results were called to [facility]. A message was left for her. she was told to call if there were questions.  Sandi Mealy, MHS, PA-C

## 2018-10-07 ENCOUNTER — Ambulatory Visit (HOSPITAL_COMMUNITY)
Admission: RE | Admit: 2018-10-07 | Discharge: 2018-10-07 | Disposition: A | Discharge status: Home / Self Care | Payer: Medicaid Other | Source: Ambulatory Visit | Attending: Medical | Admitting: Medical

## 2018-10-07 DIAGNOSIS — M79602 Pain in left arm: Secondary | ICD-10-CM | POA: Diagnosis not present

## 2018-10-07 DIAGNOSIS — M25512 Pain in left shoulder: Secondary | ICD-10-CM | POA: Diagnosis present

## 2018-10-07 NOTE — Progress Notes (Signed)
*  Preliminary Results* Left upper extremity venous duplex completed. Left upper extremity is negative for deep and superficial vein thrombosis.  10/07/2018 9:16 AM  Maudry Mayhew, MHA, RVT, RDCS, RDMS

## 2018-10-07 NOTE — Progress Notes (Signed)
[facility]pt[facility] Management [facility] Progress Note   [name] [id number] 12-13-1960 57 y.o.  [name] is managed by Dr. [name]  Actively treated with chemotherapy/immunotherapy: yes  Current Therapy: Trastuzumab  Last Treated: 09/13/2018 (cycle 31)  Assessment: Plan:    Acute pain of left shoulder - Plan: DG Humerus Left, DG Shoulder Left, DG Clavicle Left, VAS [place] UPPER EXTREMITY VENOUS DUPLEX  Pain of left upper extremity - Plan: DG Humerus Left, DG Shoulder Left, DG Clavicle Left, VAS [place] UPPER EXTREMITY VENOUS DUPLEX   Pain in the left shoulder and left upper extremity in a setting of lymphedema and mild swelling over the left supraclavicular area: The patient was referred for x-rays of her left humerus left shoulder and left clavicle.  These results returned negative.  The patient has had a Doppler ultrasound of her left upper extremity ordered.  Please see After Visit Summary for patient specific instructions.  Future Appointments  Date Time Provider [place]  10/07/2018  9:00 AM [facility] VASC US 1-[facility]  10/11/2018 10:15 AM [facility] LAB 1 [facility] None  10/11/2018 10:30 AM Causey, Charlestine Massed, NP [facility] None  10/11/2018 11:30 AM [facility] INFUSION [facility] None  01/26/2019 12:00 PM [facility] LAB 4 [facility] None  01/26/2019 12:40 PM Vaslow, Acey Lav, MD [facility] None    Orders Placed This Encounter  Procedures  . DG Humerus Left  . DG Shoulder Left  . DG Clavicle Left       Subjective:   Patient ID:  [name] is a 57 y.o. (DOB 02/03/1961) female.  Chief Complaint:  Chief Complaint  Patient presents with  . Shoulder Pain    HPI [name] is a 57 year old female with a history of a metastatic triple positive breast cancer who is managed by Dr. [name].   She was treated with trastuzumab most recently on 09/13/2018.  She presents to the office today with a report of left proximal arm pain, anterior shoulder  pain and ongoing lymphedema of her left arm.  She has not noticed that her left neck and left supraclavicular area is slightly edematous.  She denies any changes in activity and denies trauma.  Medications: I have reviewed the patient's current medications.  Allergies:  Allergies  Allergen Reactions  . Demerol Itching and Nausea And Vomiting  . Erythromycin Rash    Past Medical History:  Diagnosis Date  . Alcohol abuse   . Anemia    during chemo  . Anxiety    At age 27  . Arthritis Dx 2010  . Bipolar disorder ([place])   . Bronchitis   . Cancer ([place])    breast mets to brain  . Chronic pain   . Complication of anesthesia   . Depression   . Family history of adverse reaction to anesthesia    parents had PONV  . Fibromyalgia Dx 2005  . GERD (gastroesophageal reflux disease)   . Headache    hx  migraines  . Lymphedema of left arm   . Opiate dependence ([place])   . PONV (postoperative nausea and vomiting)   . Port-A-Cath in place   . PTSD (post-traumatic stress disorder)     Past Surgical History:  Procedure Laterality Date  . APPLICATION OF CRANIAL NAVIGATION N/A 08/14/2016   Procedure: APPLICATION OF CRANIAL NAVIGATION;  Surgeon: Erline Levine, MD;  Location: [place] [facility];  Service: Neurosurgery;  Laterality: N/A;  . BREAST RECONSTRUCTION Left    with silicone implant  . CRANIOTOMY N/A 08/14/2016  Procedure: CRANIOTOMY TUMOR EXCISION WITH Lucky Rathke;  Surgeon: Erline Levine, MD;  Location: [place] [facility];  Service: Neurosurgery;  Laterality: N/A;  . FIBULA FRACTURE SURGERY Left   . MASTECTOMY Left   . RADIOLOGY WITH ANESTHESIA N/A 07/23/2016   Procedure: MRI OF BRAIN WITH AND WITHOUT;  Surgeon: Medication Radiologist, MD;  Location: [place];  Service: Radiology;  Laterality: N/A;  . RADIOLOGY WITH ANESTHESIA N/A 09/08/2016   Procedure: MRI OF BRAIN WITH AND WITHOUT CONTRAST;  Surgeon: Medication Radiologist, MD;  Location: [place];  Service: Radiology;  Laterality: N/A;  . RADIOLOGY  WITH ANESTHESIA N/A 12/10/2016   Procedure: MRI OF BRAIN WITH AND WITHOUT;  Surgeon: Medication Radiologist, MD;  Location: [place];  Service: Radiology;  Laterality: N/A;  . RADIOLOGY WITH ANESTHESIA N/A 03/02/2017   Procedure: MRI of BRAIN W and W/OUT CONTRAST;  Surgeon: Medication Radiologist, MD;  Location: [place];  Service: Radiology;  Laterality: N/A;  . RADIOLOGY WITH ANESTHESIA N/A 07/29/2017   Procedure: RADIOLOGY WITH ANESTHESIA MRI OF BRAIN WITH AND WITHOUT CONTRAST;  Surgeon: Radiologist, Medication, MD;  Location: [place];  Service: Radiology;  Laterality: N/A;  . RADIOLOGY WITH ANESTHESIA N/A 12/07/2017   Procedure: MRI WITH ANESTHESIA OF BRAIN WITH AND WITHOUT CONTRAST;  Surgeon: Radiologist, Medication, MD;  Location: [place];  Service: Radiology;  Laterality: N/A;  . RADIOLOGY WITH ANESTHESIA N/A 04/07/2018   Procedure: MRI OF BRAIN WITH AND WITHOUT CONTRAST;  Surgeon: Radiologist, Medication, MD;  Location: [place];  Service: Radiology;  Laterality: N/A;  . RADIOLOGY WITH ANESTHESIA N/A 08/23/2018   Procedure: MRI WITH ANESTHESIA OF THE BRAIN WITH AND WITHOUT;  Surgeon: Radiologist, Medication, MD;  Location: [place];  Service: Radiology;  Laterality: N/A;  . right power port placement Right     Family History  Problem Relation Age of Onset  . Diabetes Mother   . Bipolar disorder Mother   . CAD Father     Social History   Socioeconomic History  . Marital status: Single    Spouse name: Not on file  . Number of children: Not on file  . Years of education: Not on file  . Highest education level: Not on file  Occupational History  . Not on file  Social Needs  . Financial resource strain: Not on file  . Food insecurity:    Worry: Not on file    Inability: Not on file  . Transportation needs:    Medical: Not on file    Non-medical: Not on file  Tobacco Use  . Smoking status: Current Every Day Smoker    Packs/day: 0.50    Types: Cigarettes  . Smokeless tobacco: Never Used    Substance and Sexual Activity  . Alcohol use: No    Comment: no ETOH since 08/22/12  . Drug use: No    Comment: states she's in recovery program for 5 years  . Sexual activity: Never    Birth control/protection: Other-see comments    Comment: ablation  Lifestyle  . Physical activity:    Days per week: Not on file    Minutes per session: Not on file  . Stress: Not on file  Relationships  . Social connections:    Talks on phone: Not on file    Gets together: Not on file    Attends religious service: Not on file    Active member of club or organization: Not on file    Attends meetings of clubs or organizations: Not on file  Relationship status: Not on file  . Intimate partner violence:    Fear of current or ex partner: Not on file    Emotionally abused: Not on file    Physically abused: Not on file    Forced sexual activity: Not on file  Other Topics Concern  . Not on file  Social History Narrative  . Not on file    Past Medical History, Surgical history, Social history, and Family history were reviewed and updated as appropriate.   Please see review of systems for further details on the patient's review from today.   Review of Systems:  Review of Systems  Constitutional: Negative for activity change, chills, diaphoresis, fatigue and fever.  HENT: Negative for trouble swallowing.        Mild left neck swelling  Musculoskeletal: Positive for arthralgias. Negative for back pain and joint swelling.       Mild swelling over the left supraclavicular area  Skin: Negative for color change, rash and wound.       Lymphedema of the left upper extremity  Neurological: Negative for headaches.    Objective:   Physical Exam:  BP 128/89 (BP Location: Left Arm, Patient Position: Sitting)   Pulse 89   Temp 98.2 F (36.8 C) (Oral)   Resp 18   Ht 5\' 5"  (1.651 m)   Wt 170 lb 6.4 oz (77.3 kg)   SpO2 98%   BMI 28.36 kg/m  ECOG: 0  Physical Exam  Constitutional: No distress.   HENT:  Head: Normocephalic and atraumatic.  Neck:  Mild swelling of the left lateral neck  Cardiovascular: Normal rate, regular rhythm and normal heart sounds. Exam reveals no gallop and no friction rub.  No murmur heard. Pulmonary/Chest: Effort normal and breath sounds normal. No stridor. No respiratory distress. She has no wheezes. She has no rales.  Musculoskeletal:  Diffuse swelling over the left supraclavicular area.  Lymphadenopathy:    She has no cervical adenopathy.  Neurological: She is alert. Coordination normal.  Skin: Skin is warm and dry. She is not diaphoretic.  Stable left upper extremity lymphedema.    Lab Review:     Component Value Date/Time   NA 139 09/13/2018 1013   NA 140 10/26/2017 1038   K 4.1 09/13/2018 1013   K 3.8 10/26/2017 1038   CL 101 09/13/2018 1013   CO2 27 09/13/2018 1013   CO2 24 10/26/2017 1038   GLUCOSE 114 (H) 09/13/2018 1013   GLUCOSE 158 (H) 10/26/2017 1038   BUN 14 09/13/2018 1013   BUN 12.0 10/26/2017 1038   CREATININE 1.22 (H) 09/13/2018 1013   CREATININE 0.8 10/26/2017 1038   CALCIUM 10.0 09/13/2018 1013   CALCIUM 8.7 10/26/2017 1038   PROT 7.3 09/13/2018 1013   PROT 6.1 (L) 10/26/2017 1038   ALBUMIN 4.0 09/13/2018 1013   ALBUMIN 3.7 10/26/2017 1038   AST 17 09/13/2018 1013   AST 35 (H) 10/26/2017 1038   ALT 15 09/13/2018 1013   ALT 48 10/26/2017 1038   ALKPHOS 78 09/13/2018 1013   ALKPHOS 34 (L) 10/26/2017 1038   BILITOT 0.3 09/13/2018 1013   BILITOT <0.22 10/26/2017 1038   GFRNONAA 48 (L) 09/13/2018 1013   GFRAA 56 (L) 09/13/2018 1013       Component Value Date/Time   WBC 3.9 (L) 09/13/2018 1013   RBC 4.32 09/13/2018 1013   HGB 13.5 09/13/2018 1013   HGB 12.1 10/26/2017 1038   HCT 39.8 09/13/2018 1013   HCT 35.7 10/26/2017  1038   PLT 180 09/13/2018 1013   PLT 167 10/26/2017 1038   MCV 92.1 09/13/2018 1013   MCV 98.4 10/26/2017 1038   MCH 31.3 09/13/2018 1013   MCHC 33.9 09/13/2018 1013   RDW 12.4 09/13/2018  1013   RDW 13.3 10/26/2017 1038   LYMPHSABS 1.0 09/13/2018 1013   LYMPHSABS 0.5 (L) 10/26/2017 1038   MONOABS 0.3 09/13/2018 1013   MONOABS 0.1 10/26/2017 1038   EOSABS 0.0 09/13/2018 1013   EOSABS 0.0 10/26/2017 1038   BASOSABS 0.0 09/13/2018 1013   BASOSABS 0.0 10/26/2017 1038   -------------------------------  Imaging from last 24 hours (if applicable):  Radiology interpretation: Dg Clavicle Left  Result Date: 10/04/2018 CLINICAL DATA:  Left-sided shoulder pain. History of metastatic breast cancer. EXAM: LEFT CLAVICLE - 2+ VIEWS COMPARISON:  Chest CT 07/01/2018 FINDINGS: The bony thorax is intact. No lytic or sclerotic bone lesions are identified. The sternoclavicular and AC joints are normal. The left glenohumeral joint is normal. IMPRESSION: No acute bony findings or worrisome bone lesions. Electronically Signed   By: Marijo Sanes M.D.   On: 10/04/2018 12:09   Dg Shoulder Left  Result Date: 10/04/2018 CLINICAL DATA:  Left shoulder pain. History of metastatic breast cancer. EXAM: LEFT SHOULDER - 2+ VIEW COMPARISON:  None. FINDINGS: The joint spaces are maintained. No acute bony findings or bone lesion. No abnormal soft tissue calcifications. The visualized lung is clear and the visualized ribs are intact. IMPRESSION: No acute bony findings or worrisome bone lesions. Electronically Signed   By: Marijo Sanes M.D.   On: 10/04/2018 12:08   Dg Humerus Left  Result Date: 10/04/2018 CLINICAL DATA:  Shoulder pain.  History of metastatic breast cancer. EXAM: LEFT HUMERUS - 2+ VIEW COMPARISON:  None. FINDINGS: The shoulder and elbow joints are maintained. No acute bony findings or worrisome bone lesions involving the humerus. IMPRESSION: No acute bony findings or worrisome bone lesions. Electronically Signed   By: Marijo Sanes M.D.   On: 10/04/2018 12:06

## 2018-10-09 ENCOUNTER — Other Ambulatory Visit: Payer: Self-pay | Admitting: Oncology

## 2018-10-09 DIAGNOSIS — R519 Headache, unspecified: Secondary | ICD-10-CM

## 2018-10-09 DIAGNOSIS — R51 Headache: Principal | ICD-10-CM

## 2018-10-09 DIAGNOSIS — G8929 Other chronic pain: Secondary | ICD-10-CM

## 2018-10-09 DIAGNOSIS — M62838 Other muscle spasm: Secondary | ICD-10-CM

## 2018-10-10 ENCOUNTER — Telehealth: Payer: Self-pay

## 2018-10-10 NOTE — Telephone Encounter (Signed)
-----   Message from Charlott Rakes, MD sent at 10/10/2018  9:42 AM EST ----- Pap smear is negative for malignancy

## 2018-10-10 NOTE — Telephone Encounter (Signed)
Patient was called and informed of lab results. 

## 2018-10-11 ENCOUNTER — Telehealth: Payer: Self-pay | Admitting: Adult Health

## 2018-10-11 ENCOUNTER — Inpatient Hospital Stay: Payer: Medicaid Other

## 2018-10-11 ENCOUNTER — Encounter: Payer: Self-pay | Admitting: Adult Health

## 2018-10-11 ENCOUNTER — Inpatient Hospital Stay (HOSPITAL_BASED_OUTPATIENT_CLINIC_OR_DEPARTMENT_OTHER): Payer: Medicaid Other | Admitting: Adult Health

## 2018-10-11 VITALS — BP 134/92 | HR 90 | Temp 98.6°F | Resp 16 | Ht 65.0 in | Wt 170.7 lb

## 2018-10-11 DIAGNOSIS — R05 Cough: Secondary | ICD-10-CM | POA: Diagnosis not present

## 2018-10-11 DIAGNOSIS — C7931 Secondary malignant neoplasm of brain: Secondary | ICD-10-CM | POA: Diagnosis not present

## 2018-10-11 DIAGNOSIS — Z9012 Acquired absence of left breast and nipple: Secondary | ICD-10-CM

## 2018-10-11 DIAGNOSIS — F319 Bipolar disorder, unspecified: Secondary | ICD-10-CM

## 2018-10-11 DIAGNOSIS — C50312 Malignant neoplasm of lower-inner quadrant of left female breast: Secondary | ICD-10-CM

## 2018-10-11 DIAGNOSIS — Z5112 Encounter for antineoplastic immunotherapy: Secondary | ICD-10-CM | POA: Diagnosis not present

## 2018-10-11 DIAGNOSIS — C7951 Secondary malignant neoplasm of bone: Secondary | ICD-10-CM

## 2018-10-11 DIAGNOSIS — Z170 Estrogen receptor positive status [ER+]: Secondary | ICD-10-CM

## 2018-10-11 DIAGNOSIS — Z1239 Encounter for other screening for malignant neoplasm of breast: Secondary | ICD-10-CM

## 2018-10-11 DIAGNOSIS — C50919 Malignant neoplasm of unspecified site of unspecified female breast: Secondary | ICD-10-CM

## 2018-10-11 DIAGNOSIS — I890 Lymphedema, not elsewhere classified: Secondary | ICD-10-CM

## 2018-10-11 DIAGNOSIS — F17210 Nicotine dependence, cigarettes, uncomplicated: Secondary | ICD-10-CM

## 2018-10-11 DIAGNOSIS — Z79811 Long term (current) use of aromatase inhibitors: Secondary | ICD-10-CM

## 2018-10-11 LAB — COMPREHENSIVE METABOLIC PANEL
ALT: 12 U/L (ref 0–44)
AST: 15 U/L (ref 15–41)
Albumin: 3.7 g/dL (ref 3.5–5.0)
Alkaline Phosphatase: 72 U/L (ref 38–126)
Anion gap: 8 (ref 5–15)
BUN: 13 mg/dL (ref 6–20)
CO2: 27 mmol/L (ref 22–32)
Calcium: 9.1 mg/dL (ref 8.9–10.3)
Chloride: 105 mmol/L (ref 98–111)
Creatinine, Ser: 0.99 mg/dL (ref 0.44–1.00)
GFR calc Af Amer: >60 mL/min (ref >60)
GFR calc non Af Amer: >60 mL/min (ref >60)
Glucose, Bld: 104 mg/dL — ABNORMAL HIGH (ref 70–99)
Potassium: 3.7 mmol/L (ref 3.5–5.1)
Sodium: 140 mmol/L (ref 135–145)
Total Bilirubin: 0.3 mg/dL (ref 0.3–1.2)
Total Protein: 6.7 g/dL (ref 6.5–8.1)

## 2018-10-11 LAB — CBC WITH DIFFERENTIAL/PLATELET
Abs Immature Granulocytes: 0.03 10*3/uL (ref 0.00–0.07)
Basophils Absolute: 0 10*3/uL (ref 0.0–0.1)
Basophils Relative: 0 %
Eosinophils Absolute: 0 10*3/uL (ref 0.0–0.5)
Eosinophils Relative: 0 %
HCT: 36.4 % (ref 36.0–46.0)
Hemoglobin: 12.1 g/dL (ref 12.0–15.0)
Immature Granulocytes: 1 %
Lymphocytes Relative: 18 %
Lymphs Abs: 0.9 10*3/uL (ref 0.7–4.0)
MCH: 31 pg (ref 26.0–34.0)
MCHC: 33.2 g/dL (ref 30.0–36.0)
MCV: 93.3 fL (ref 80.0–100.0)
Monocytes Absolute: 0.3 10*3/uL (ref 0.1–1.0)
Monocytes Relative: 6 %
Neutro Abs: 3.8 10*3/uL (ref 1.7–7.7)
Neutrophils Relative %: 75 %
Platelets: 189 10*3/uL (ref 150–400)
RBC: 3.9 MIL/uL (ref 3.87–5.11)
RDW: 12.5 % (ref 11.5–15.5)
WBC: 5.1 10*3/uL (ref 4.0–10.5)
nRBC: 0 % (ref 0.0–0.2)

## 2018-10-11 MED ORDER — TRASTUZUMAB CHEMO 150 MG IV SOLR
450.0000 mg | Freq: Once | INTRAVENOUS | Status: AC
  Administered 2018-10-11: 450 mg via INTRAVENOUS
  Filled 2018-10-11: qty 21.4

## 2018-10-11 MED ORDER — HEPARIN SOD (PORK) LOCK FLUSH 100 UNIT/ML IV SOLN
500.0000 [IU] | Freq: Once | INTRAVENOUS | Status: DC | PRN
  Filled 2018-10-11: qty 5

## 2018-10-11 MED ORDER — SODIUM CHLORIDE 0.9% FLUSH
10.0000 mL | INTRAVENOUS | Status: DC | PRN
  Filled 2018-10-11: qty 10

## 2018-10-11 MED ORDER — DENOSUMAB 120 MG/1.7ML ~~LOC~~ SOLN
120.0000 mg | Freq: Once | SUBCUTANEOUS | Status: AC
  Administered 2018-10-11: 120 mg via SUBCUTANEOUS

## 2018-10-11 MED ORDER — SODIUM CHLORIDE 0.9 % IV SOLN
Freq: Once | INTRAVENOUS | Status: AC
  Administered 2018-10-11: 12:00:00 via INTRAVENOUS
  Filled 2018-10-11: qty 250

## 2018-10-11 MED ORDER — DENOSUMAB 120 MG/1.7ML ~~LOC~~ SOLN
SUBCUTANEOUS | Status: AC
  Filled 2018-10-11: qty 1.7

## 2018-10-11 NOTE — Telephone Encounter (Signed)
Per 11/12 los.  Radiology will call patient re CT scan.  Pre-authorization inquiry sent to managed care.  Calendar to be printed in infusion.

## 2018-10-11 NOTE — Telephone Encounter (Signed)
11/12 los appts made; tx plan not in system.

## 2018-10-11 NOTE — Patient Instructions (Signed)
[facility] Discharge Instructions for Patients Receiving Chemotherapy  Today you received the following chemotherapy agents: Trastuzumab (Herceptin)  To help prevent nausea and vomiting after your treatment, we encourage you to take your nausea medication as directed.    If you develop nausea and vomiting that is not controlled by your nausea medication, call the clinic.   BELOW ARE SYMPTOMS THAT SHOULD BE REPORTED IMMEDIATELY:  *FEVER GREATER THAN 100.5 F  *CHILLS WITH OR WITHOUT FEVER  NAUSEA AND VOMITING THAT IS NOT CONTROLLED WITH YOUR NAUSEA MEDICATION  *UNUSUAL SHORTNESS OF BREATH  *UNUSUAL BRUISING OR BLEEDING  TENDERNESS IN MOUTH AND THROAT WITH OR WITHOUT PRESENCE OF ULCERS  *URINARY PROBLEMS  *BOWEL PROBLEMS  UNUSUAL RASH Items with * indicate a potential emergency and should be followed up as soon as possible.  Feel free to call the clinic should you have any questions or concerns. The clinic phone number is [phone].  Please show the CHEMO ALERT CARD at check-in to the Emergency Department and triage nurse.    

## 2018-10-11 NOTE — Progress Notes (Signed)
[place]  [phone] [phone]     ID: [name] DOB: September 25, 1961  MR#: [id number]  GYI#:[id number]  Patient Care Team: Charlott Rakes, MD as PCP - General (Family Medicine) Kyung Rudd, MD as Consulting Physician (Radiation Oncology) Erline Levine, MD as Consulting Physician (Neurosurgery) Corena Pilgrim, MD as Consulting Physician (Psychiatry) Mickeal Skinner Acey Lav, MD as Consulting Physician (Psychiatry) Nehemiah Settle, MD as Referring Physician (Plastic Surgery) Irene Limbo, MD as Consulting Physician (Plastic Surgery)  CHIEF COMPLAINT: Metastatic triple positive breast cancer  CURRENT TREATMENT: Trastuzumab, denosumab, anastrozole   INTERVAL HISTORY: [name] returns today for a follow-up and treatment of her metastatic triple positive breast cancer. She continues on anastrozole with good tolerance.    She also receives trastuzumab every 28 days, with a dose due today. She tolerates this well. She denies fatigue or issues with diarrhea.  She completed an echocardiogram on 06/30/2018 showing an ejection fraction in the 55-60% range. She has these done every 6 months.   She underwent doppler of her left arm and xrays due to left shoulder pain and swelling that were negative, she notes her pain is improved.  She continues to have left arm lymphedema and has a sleeve and compression device that she uses for this.     REVIEW OF SYSTEMS: Mell is doing well today.  She has a mild NP cough that has been present for about one week. She denies any chest pain, shortness of breath, or drainage.  She is without any fevers, chills, nausea, vomiting, bowel/bladder changes or new pain.  A detailed ROS was otherwise non contributory.    BREAST CANCER HISTORY: From the original intake note:  [name] was aware of a "lemon sized lump in" her left axilla for about a year before bringing it to medical attention. By then she had developed left  breast and left axillary swelling (June 2016). She presented to the local emergency room and had a chest CT scan 06/06/2015 which showed a nodule in the left breast measuring 0.9 cm and questionable left axillary adenopathy. She then proceeded to bilateral diagnostic mammography and left breast ultrasonography 06/19/2015. There were no prior films for comparison (last mammography 12 years prior).. The breast density was category C. Mammography showed in the left breast upper inner quadrant a 7 cm area including a small mass and significant pleomorphic calcifications. Ultrasonography defined the mass as measuring 1.2 cm. The left axilla appeared unremarkable. There was significant skin edema.  Biopsy of the left breast mass 06/19/2015 showed (SP [id number]) an invasive ductal carcinoma, grade 2, estrogen receptor 83% positive, progesterone receptor 26% positive, and HER-2 amplified by immunohistochemstry with a 3+ reading. The patient had biopsies of a separate area in the left breast August of the same year and this showed atypical ductal hyperplasia. (SP [id number]).  Accordingly after appropriate discussion on 08/21/2015 the patient proceeded to left mastectomy with left axillary sentinel lymph node sampling, which, since the lymph nodes were positive, extended to the procedure to left axillary lymph node dissection. The pathology (SP [id number]) showed an invasive ductal carcinoma, grade 3, measuring in excess of 9 cm. There were also skin satellites, not contiguous with the invasive carcinoma. Margins were clear and ample. There was evidence of lymphovascular invasion. A total of 15 lymph nodes were removed, including 5 sentinel lymph nodes, all of which were positive, so that the final total was 14 out of 15 lymph nodes involved by tumor. There was evidence of extranodal extension. The  final pathology was pT4b pN2a, stage IIIB  CA-27-29 and CEA 09/19/2015 were non-informative October 2016.  Unfortunately CT  scans of the chest abdomen and pelvis 09/16/2015 showed bony metastases to the right scapula, left iliac crest, and also L4 and T-spine. There were questionable liver cysts which on repeat CT scan 03/02/2016 appear to be a little bit more well-defined, possibly a little larger. There were also some possible right upper lobe lung lesions.  Adjuvant treatment consisted of docetaxel, trastuzumab and pertuzumab, with the final (6th) docetaxel dose given 02/11/2016. She continues on trastuzumab and pertuzumab, with the 11th cycle given 05/05/2016. Echocardiogram 02/26/2016 showed an ejection fraction of 55%. She receives denosumab/Xgeva every 4 weeks.. She also receives radiation, started 06/09/201, to be completed 06/26/2016.  Her subsequent history is as detailed below   PAST MEDICAL HISTORY: Past Medical History:  Diagnosis Date  . Alcohol abuse   . Anemia    during chemo  . Anxiety    At age 8  . Arthritis Dx 2010  . Bipolar disorder ([place])   . Bronchitis   . Cancer ([place])    breast mets to brain  . Chronic pain   . Complication of anesthesia   . Depression   . Family history of adverse reaction to anesthesia    parents had PONV  . Fibromyalgia Dx 2005  . GERD (gastroesophageal reflux disease)   . Headache    hx  migraines  . Lymphedema of left arm   . Opiate dependence ([place])   . PONV (postoperative nausea and vomiting)   . Port-A-Cath in place   . PTSD (post-traumatic stress disorder)      PAST SURGICAL HISTORY: Past Surgical History:  Procedure Laterality Date  . APPLICATION OF CRANIAL NAVIGATION N/A 08/14/2016   Procedure: APPLICATION OF CRANIAL NAVIGATION;  Surgeon: Erline Levine, MD;  Location: [facility];  Service: Neurosurgery;  Laterality: N/A;  . BREAST RECONSTRUCTION Left    with silicone implant  . CRANIOTOMY N/A 08/14/2016   Procedure: CRANIOTOMY TUMOR EXCISION WITH Lucky Rathke;  Surgeon: Erline Levine, MD;  Location: [facility];  Service: Neurosurgery;   Laterality: N/A;  . FIBULA FRACTURE SURGERY Left   . MASTECTOMY Left   . RADIOLOGY WITH ANESTHESIA N/A 07/23/2016   Procedure: MRI OF BRAIN WITH AND WITHOUT;  Surgeon: Medication Radiologist, MD;  Location: [facility];  Service: Radiology;  Laterality: N/A;  . RADIOLOGY WITH ANESTHESIA N/A 09/08/2016   Procedure: MRI OF BRAIN WITH AND WITHOUT CONTRAST;  Surgeon: Medication Radiologist, MD;  Location: [facility];  Service: Radiology;  Laterality: N/A;  . RADIOLOGY WITH ANESTHESIA N/A 12/10/2016   Procedure: MRI OF BRAIN WITH AND WITHOUT;  Surgeon: Medication Radiologist, MD;  Location: [facility];  Service: Radiology;  Laterality: N/A;  . RADIOLOGY WITH ANESTHESIA N/A 03/02/2017   Procedure: MRI of BRAIN W and W/OUT CONTRAST;  Surgeon: Medication Radiologist, MD;  Location: [facility];  Service: Radiology;  Laterality: N/A;  . RADIOLOGY WITH ANESTHESIA N/A 07/29/2017   Procedure: RADIOLOGY WITH ANESTHESIA MRI OF BRAIN WITH AND WITHOUT CONTRAST;  Surgeon: Radiologist, Medication, MD;  Location: [facility];  Service: Radiology;  Laterality: N/A;  . RADIOLOGY WITH ANESTHESIA N/A 12/07/2017   Procedure: MRI WITH ANESTHESIA OF BRAIN WITH AND WITHOUT CONTRAST;  Surgeon: Radiologist, Medication, MD;  Location: [facility];  Service: Radiology;  Laterality: N/A;  . RADIOLOGY WITH ANESTHESIA N/A 04/07/2018   Procedure: MRI OF BRAIN WITH AND WITHOUT CONTRAST;  Surgeon: Radiologist, Medication, MD;  Location: [facility];  Service: Radiology;  Laterality: N/A;  . RADIOLOGY WITH ANESTHESIA N/A 08/23/2018   Procedure: MRI WITH ANESTHESIA OF THE BRAIN WITH AND WITHOUT;  Surgeon: Radiologist, Medication, MD;  Location: [facility];  Service: Radiology;  Laterality: N/A;  . right power port placement Right      FAMILY HISTORY Family History  Problem Relation Age of Onset  . Diabetes Mother   . Bipolar disorder Mother   . CAD Father    The patient's father still living, age 58, in [place]. He had prostate cancer at some point in the past. The  patient's mother died at age 54 from complications of diabetes. The patient had no brothers, 2 sisters. A paternal grandmother had lung cancer in the setting of tobacco abuse. There is no other history of cancer in the family to her knowledge  GYNECOLOGIC HISTORY:  No LMP recorded. Patient has had an ablation. Menarche approximately age 9. First live birth in 59. The patient is GX P2. She underwent endometrial ablation in 2016.  SOCIAL HISTORY: Updated August 2019 The patient is originally from [place]. She has lived in [place] before but more recently was in [place]. She is back here because she could not afford her rent in [place]. She is living here and a temporary situation. She is divorced. Her 2 children are [name] who lives in [place] and works as a Development worker, community, and [name] who also lives in [place] and works as a Catering manager. The patient has a grandchild, Arelia Longest, 52 years old as of July 2019, living in [place] with his mother. The patient also has a grandson born in February 2019, who also lives in [place]. The patient has not established herself with a local church yet.    ADVANCED DIRECTIVES: Not in place; at the 06/03/2016 visit the patient was given the appropriate forms to complete and notarize at her discretion   HEALTH MAINTENANCE: Social History   Tobacco Use  . Smoking status: Current Every Day Smoker    Packs/day: 0.50    Types: Cigarettes  . Smokeless tobacco: Never Used  Substance Use Topics  . Alcohol use: No    Comment: no ETOH since 08/22/12  . Drug use: No    Comment: states she's in recovery program for 5 years     Colonoscopy:  PAP:  Bone density:   Allergies  Allergen Reactions  . Demerol Itching and Nausea And Vomiting  . Erythromycin Rash    Current Outpatient Medications on File Prior to Visit  Medication Sig Dispense Refill  . anastrozole (ARIMIDEX) 1 MG tablet TAKE 1 TABLET BY MOUTH EVERY DAY 90 tablet 1  . Biotin 1000 MCG  tablet Take 1,000 mcg by mouth daily.    . Cholecalciferol (VITAMIN D3) 5000 units CAPS Take 5,000 Units by mouth daily.    . cyclobenzaprine (FLEXERIL) 5 MG tablet TAKE 2 TABLET BY MOUTH TWICE A DAY AS NEEDED FOR MUSCLE SPASMS 60 tablet 0  . docusate sodium (COLACE) 100 MG capsule Take 100 mg by mouth daily as needed for mild constipation.     . fluticasone (FLONASE) 50 MCG/ACT nasal spray SPRAY 2 SPRAYS INTO EACH NOSTRIL EVERY DAY (Patient taking differently: Place 2 sprays into both nostrils daily. ) 16 g 2  . gabapentin (NEURONTIN) 300 MG capsule Take 1 capsule (300 mg total) by mouth 2 (two) times daily. (Patient taking differently: Take 300-600 mg by mouth See admin instructions. Take 300 mg by mouth in the morning and take 600 mg by mouth at  bedtime) 60 capsule 3  . ibuprofen (ADVIL,MOTRIN) 800 MG tablet TAKE 1 TABLET BY MOUTH THREE TIMES A DAY 90 tablet 0  . lamoTRIgine (LAMICTAL) 25 MG tablet Take 1 tablet (25 mg total) by mouth 2 (two) times daily. (Patient taking differently: Take 75 mg by mouth daily. ) 60 tablet 0  . loratadine (CLARITIN) 10 MG tablet TAKE 1 TABLET BY MOUTH EVERY DAY (Patient taking differently: Take 10 mg by mouth daily. ) 30 tablet 11  . lurasidone (LATUDA) 40 MG TABS tablet Take 1 tablet (40 mg total) by mouth daily with breakfast. 30 tablet 0  . metroNIDAZOLE (METROGEL VAGINAL) 0.75 % vaginal gel Place 1 Applicatorful vaginally at bedtime. 70 g 0  . ondansetron (ZOFRAN) 8 MG tablet Take 1 tablet (8 mg total) by mouth every 8 (eight) hours as needed for nausea or vomiting. 90 tablet 1  . pantoprazole (PROTONIX) 40 MG tablet Take 1 tablet (40 mg total) by mouth daily. 30 tablet 2  . polyethylene glycol (MIRALAX / GLYCOLAX) packet TAKE 17 G BY MOUTH DAILY AS NEEDED FOR MILD CONSTIPATION. 30 packet 0  . traZODone (DESYREL) 100 MG tablet Take 1 tablet (100 mg total) by mouth at bedtime. (Patient taking differently: Take 200 mg by mouth at bedtime. ) 30 tablet 0  .  varenicline (CHANTIX) 0.5 MG tablet Take 0.5 mg by mouth daily.     No current facility-administered medications on file prior to visit.      OBJECTIVE: Middle-aged white woman who appears well  Vitals:   10/11/18 1051  BP: (!) 134/92  Pulse: 90  Resp: 16  Temp: 98.6 F (37 C)  TempSrc: Oral  SpO2: 98%  Weight: 170 lb 11.2 oz (77.4 kg)  Height: 5' 5"  (1.651 m)  Body mass index is 28.41 kg/m.  ECOG FS: 1 - Symptomatic but completely ambulatory GENERAL: Patient is a well appearing female in no acute distress HEENT:  Sclerae anicteric.  Oropharynx clear and moist. No ulcerations or evidence of oropharyngeal candidiasis. Neck is supple.  NODES:  No cervical, supraclavicular, or axillary lymphadenopathy palpated.  BREAST EXAM:  Right breast is without any nodules, masses, skin or nipple changes, left breast is s/p mastectomy and implant placement, no nodularity or sign of local recurrence noted LUNGS:  Clear to auscultation bilaterally.  No wheezes or rhonchi. HEART:  Regular rate and rhythm. No murmur appreciated. ABDOMEN:  Soft, nontender.  Positive, normoactive bowel sounds. No organomegaly palpated. MSK:  No focal spinal tenderness to palpation. Full range of motion bilaterally in the upper extremities. Pain noted in right shoulder with active ROM.  Right rhomboid muscle has mild TTP.   EXTREMITIES:  No peripheral edema.   SKIN:  Clear with no obvious rashes or skin changes. No nail dyscrasia. NEURO:  Nonfocal. Well oriented.  Appropriate affect.    LAB RESULTS: No results found for: LABCA2  CBC    Component Value Date/Time   WBC 5.1 10/11/2018 1037   RBC 3.90 10/11/2018 1037   HGB 12.1 10/11/2018 1037   HGB 12.1 10/26/2017 1038   HCT 36.4 10/11/2018 1037   HCT 35.7 10/26/2017 1038   PLT 189 10/11/2018 1037   PLT 167 10/26/2017 1038   MCV 93.3 10/11/2018 1037   MCV 98.4 10/26/2017 1038   MCH 31.0 10/11/2018 1037   MCHC 33.2 10/11/2018 1037   RDW 12.5 10/11/2018  1037   RDW 13.3 10/26/2017 1038   LYMPHSABS 0.9 10/11/2018 1037   LYMPHSABS 0.5 (L) 10/26/2017 1038  MONOABS 0.3 10/11/2018 1037   MONOABS 0.1 10/26/2017 1038   EOSABS 0.0 10/11/2018 1037   EOSABS 0.0 10/26/2017 1038   BASOSABS 0.0 10/11/2018 1037   BASOSABS 0.0 10/26/2017 1038   CMP Latest Ref Rng & Units 09/13/2018 08/23/2018 08/16/2018  Glucose 70 - 99 mg/dL 114(H) 111(H) 122(H)  BUN 6 - 20 mg/dL 14 9 12   Creatinine 0.44 - 1.00 mg/dL 1.22(H) 1.05(H) 1.16(H)  Sodium 135 - 145 mmol/L 139 141 142  Potassium 3.5 - 5.1 mmol/L 4.1 3.8 4.2  Chloride 98 - 111 mmol/L 101 105 104  CO2 22 - 32 mmol/L 27 27 27   Calcium 8.9 - 10.3 mg/dL 10.0 9.0 9.9  Total Protein 6.5 - 8.1 g/dL 7.3 - 7.0  Total Bilirubin 0.3 - 1.2 mg/dL 0.3 - 0.3  Alkaline Phos 38 - 126 U/L 78 - 66  AST 15 - 41 U/L 17 - 14(L)  ALT 0 - 44 U/L 15 - 11     STUDIES: Dg Clavicle Left  Result Date: 10/04/2018 CLINICAL DATA:  Left-sided shoulder pain. History of metastatic breast cancer. EXAM: LEFT CLAVICLE - 2+ VIEWS COMPARISON:  Chest CT 07/01/2018 FINDINGS: The bony thorax is intact. No lytic or sclerotic bone lesions are identified. The sternoclavicular and AC joints are normal. The left glenohumeral joint is normal. IMPRESSION: No acute bony findings or worrisome bone lesions. Electronically Signed   By: Marijo Sanes M.D.   On: 10/04/2018 12:09   Dg Shoulder Left  Result Date: 10/04/2018 CLINICAL DATA:  Left shoulder pain. History of metastatic breast cancer. EXAM: LEFT SHOULDER - 2+ VIEW COMPARISON:  None. FINDINGS: The joint spaces are maintained. No acute bony findings or bone lesion. No abnormal soft tissue calcifications. The visualized lung is clear and the visualized ribs are intact. IMPRESSION: No acute bony findings or worrisome bone lesions. Electronically Signed   By: Marijo Sanes M.D.   On: 10/04/2018 12:08   Dg Humerus Left  Result Date: 10/04/2018 CLINICAL DATA:  Shoulder pain.  History of metastatic breast  cancer. EXAM: LEFT HUMERUS - 2+ VIEW COMPARISON:  None. FINDINGS: The shoulder and elbow joints are maintained. No acute bony findings or worrisome bone lesions involving the humerus. IMPRESSION: No acute bony findings or worrisome bone lesions. Electronically Signed   By: Marijo Sanes M.D.   On: 10/04/2018 12:06   Vas [place] Upper Extremity Venous Duplex  Result Date: 10/07/2018 UPPER VENOUS STUDY  Indications: Pain Performing Technologist: Maudry Mayhew MHA, RDMS, RVT, RDCS  Examination Guidelines: A complete evaluation includes B-mode imaging, spectral Doppler, color Doppler, and power Doppler as needed of all accessible portions of each vessel. Bilateral testing is considered an integral part of a complete examination. Limited examinations for reoccurring indications may be performed as noted.  Right Findings: +----------+------------+----------+---------+-----------+-------+ RIGHT     CompressiblePropertiesPhasicitySpontaneousSummary +----------+------------+----------+---------+-----------+-------+ Subclavian                         Yes       Yes            +----------+------------+----------+---------+-----------+-------+  Left Findings: +----------+------------+----------+---------+-----------+-------+ LEFT      CompressiblePropertiesPhasicitySpontaneousSummary +----------+------------+----------+---------+-----------+-------+ IJV           Full                 Yes       Yes            +----------+------------+----------+---------+-----------+-------+ Subclavian    Full  Yes       Yes            +----------+------------+----------+---------+-----------+-------+ Axillary      Full                 Yes       Yes            +----------+------------+----------+---------+-----------+-------+ Brachial      Full                 Yes       Yes            +----------+------------+----------+---------+-----------+-------+ Radial        Full                                           +----------+------------+----------+---------+-----------+-------+ Ulnar         Full                                          +----------+------------+----------+---------+-----------+-------+ Cephalic      Full                                          +----------+------------+----------+---------+-----------+-------+ Basilic       Full                                          +----------+------------+----------+---------+-----------+-------+  Summary:  Right: No evidence of thrombosis in the subclavian.  Left: No evidence of deep vein thrombosis in the upper extremity. No evidence of superficial vein thrombosis in the upper extremity.  *See table(s) above for measurements and observations.  Diagnosing physician: Monica Martinez MD Electronically signed by Monica Martinez MD on 10/07/2018 at 1:53:30 PM.    Final     ELIGIBLE FOR AVAILABLE RESEARCH PROTOCOL: no  ASSESSMENT: 57 y.o. [place] woman with stage IV left-sided breast cancer involving bone and central nervous system  (1) s/p left breast lower inner quadrant biopsy 06/19/2015 for a clinical T2-3 NX invasive ductal carcinoma, grade 2, triple positive.  (2) status post left mastectomy and axillary lymph node dissection  with immediate expander placement 07/18/2015 for an mpT4 pN2,stage IIIB invasive ductal carcinoma, grade 3, with negative margins.  (a) definitive implant exchange to be scheduled in December   METASTATIC DISEASE: October 2016  (3) CT scan of the chest abdomen and pelvis  09/16/2015 shows metastatic lesions in the right scapula, left iliac crest, L4, and T spine. There were questionable liver cysts, with repeat CT scan 03/02/2016 showing possible right upper lobe lung lesions and possibly increased liver lesions  (a) CT scan of the chest 06/17/2016 shows no active disease in the lungs or liver  (b) Bone scan July 2017 showed no evidence of bony metastatic disease    (c) head CT 07/08/2016 showed a cerebellar lesion, confirmed by MRI 07/23/2016, status post craniotomy 08/14/2016, confirming a metastatic deposit which was estrogen and progesterone receptor negative, HER-2 amplified with a signals ratio of 7.16, number per cell 13.25  (d) CA 27-29 is not informative  (4) received docetaxel every 3  weeks 6 together with trastuzumab and pertuzumab, last docetaxel dose 02/11/2016  (5) adjuvant radiation7/03/2016 to 06/26/2016 at [facility]: 1. The Left chest wall was treated to 23.4 Gy in 13 fractions at 1.8 Gy per fraction. 2. The Left chest wall was boosted to 10 Gy in 5 fractions at 2 Gy per fraction. 3. The Left Sclav/PAB was treated to 23.4 Gy in 13 fractions at 1.8 Gy per fraction.  [Note: Including the patient's treatment in [place] (received 15 fractions per Dr. Maryan Rued near [place], [place]), the patient received 50.4 Gy to the left chest wall and supraclavicular region. ]  (6) started trastuzumab and pertuzumab October 2016, continuing every 3 weeks,  (a) echocardiogram 02/26/2016 showed a well preserved ejection fraction  (b) echocardiogram 07/01/2016 shows an ejection fraction in the 60-65%   (c) pertuzumab discontinued 10/2016 with uncontrolled diarrhea  (d) echocardiogram 11/11/2016 showed an ejection fraction in the 60-65%  (e) echocardiogram 03/03/2017 shows an ejection fraction of 60-65%  (f) echocardiogram on 05/19/2017 shows an ejection fraction of 55-60%  (g) echocardiogram 09/24/2017 shows the ejection fraction in the 60-65%  (h) echocardiogram 02/14/2018 shows an ejection fraction in the 60-65%  (I) echocardiogram  06/30/2018 shows an ejection fraction in the 55-60%    (7) started denosumab/Xgeva October 2017 given every 4 weeks, transitioned to every 8 weeks beginning 10/11/18  (8) started anastrozole October 2017   (a) bone scan 11/10/2016 shows no active disease  (b) chest CT scan 11/10/2016 stable, with no evidence of active  disease  (c) chest CT and bone scan 07/02/2017 show no evidence of active disease  (d) CT scan of the chest with contrast 11/10/2017 shows some left axillary edema, but no evidence of thrombosis or adenopathy in that area, 0.9 cm precarinal lymph and 0.7 cm right upper lobe nodule node; bone lesions stable  (e) CT of the chest 05/04/2018 shows a 1.4 cm right lower paratracheal node which is slightly increased and a new right prevascular mediastinal node measuring 0.7 cm.  Bone lesions are stable.  (f) chest CT on 07/01/2018 shows no definite findings of metastatic disease in the thorax. 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  (a) brain irradiation10/19/17-11/08/17: Whole brain/ 35 Gy in 14 fractions   (b) repeat brain MRI obtained 12/10/2016 shows no active disease in the brain  (c) repeat brain MRI 03/02/2017 shows no evidence of residual or recurrent disease  (d) repeat brain MRI 07/29/2017 shows no evidence of residual or recurrent disease  (e) repeat brain MRI 12/07/2017 shows no evidence of disease recurrence.  There is progressive white matter change secondary to prior treatment.  (f) repeat brain MRI 04/07/2018 showed no evidence of disease\  (g) repeat brain MRI on 08/23/2018 shows no evidence of disease   PLAN:  [name] is doing well today.  She will continue on Anastrozole, and Trastuzumab as she is tolerating this treatment well.  She will go to receiving Xgeva every 8 weeks.  She missed her dose in October, so  she will receive the [facility]  today and with every other Trastuzumab.    Warren Lacy and I reviewed her cough.  This is mild and not particularly bothersome.  Her lung exam is normal, so she knows that if it persists for another week, or worsens to let me know so I can get a chest xray on her.    Warren Lacy and I reviewed her upcoming scans.  She will undergo mammogram in 11/2018; ordered today, CT chest 12/2018; ordered today, echo 12/2018; ordered today.  She has MRI brain ordered for 2/20 and will f/u with Dr. Mickeal Skinner on 01/26/2018 to review those results.    Warren Lacy and I scheduled her next few appointments.  She will return in 4 weeks for labs and Trastuzumab (does not want separate flush appointments, wants labs drawn from hand), 8 weeks for labs, f/u with me and Trastuzumab, and in 12 weeks for labs, f/u with Dr. [name] to review echo and CT scan results, and Trastuzumab.    She knows to call for any other issues that may develop before the next visit, as we can certainly see her sooner if needed.    A total of (30) minutes of face-to-face time was spent with this patient with greater than 50% of that time in counseling and care-coordination.  Wilber Bihari, NP  10/11/18 11:01 AM Medical Oncology and Hematology [facility] [address] [place], [place] [address] Tel. [phone]    Fax. [phone]

## 2018-10-12 LAB — CANCER ANTIGEN 27.29: CA 27.29: 12.1 U/mL (ref 0.0–38.6)

## 2018-10-17 LAB — CYTOLOGY - PAP: HPV: NOT DETECTED

## 2018-10-23 ENCOUNTER — Other Ambulatory Visit: Payer: Self-pay | Admitting: Oncology

## 2018-10-23 ENCOUNTER — Other Ambulatory Visit: Payer: Self-pay | Admitting: Family Medicine

## 2018-10-23 DIAGNOSIS — C50312 Malignant neoplasm of lower-inner quadrant of left female breast: Secondary | ICD-10-CM

## 2018-11-02 ENCOUNTER — Other Ambulatory Visit: Payer: Self-pay | Admitting: Oncology

## 2018-11-07 ENCOUNTER — Other Ambulatory Visit: Payer: Self-pay

## 2018-11-07 MED ORDER — CYCLOBENZAPRINE HCL 10 MG PO TABS: 10.0000 mg | ORAL_TABLET | Freq: Every day | ORAL | 0 refills | Status: DC

## 2018-11-08 ENCOUNTER — Inpatient Hospital Stay: Payer: Medicaid Other | Attending: Medical

## 2018-11-08 ENCOUNTER — Inpatient Hospital Stay: Payer: Medicaid Other

## 2018-11-08 VITALS — BP 118/84 | HR 88 | Temp 98.3°F | Resp 18 | Ht 65.0 in | Wt 169.8 lb

## 2018-11-08 DIAGNOSIS — C7931 Secondary malignant neoplasm of brain: Secondary | ICD-10-CM | POA: Diagnosis not present

## 2018-11-08 DIAGNOSIS — Z5112 Encounter for antineoplastic immunotherapy: Secondary | ICD-10-CM | POA: Diagnosis present

## 2018-11-08 DIAGNOSIS — C7951 Secondary malignant neoplasm of bone: Secondary | ICD-10-CM | POA: Insufficient documentation

## 2018-11-08 DIAGNOSIS — C50312 Malignant neoplasm of lower-inner quadrant of left female breast: Secondary | ICD-10-CM | POA: Diagnosis present

## 2018-11-08 LAB — COMPREHENSIVE METABOLIC PANEL
ALT: 11 U/L (ref 0–44)
AST: 15 U/L (ref 15–41)
Albumin: 3.8 g/dL (ref 3.5–5.0)
Alkaline Phosphatase: 62 U/L (ref 38–126)
Anion gap: 9 (ref 5–15)
BUN: 11 mg/dL (ref 6–20)
CO2: 25 mmol/L (ref 22–32)
Calcium: 8.8 mg/dL — ABNORMAL LOW (ref 8.9–10.3)
Chloride: 106 mmol/L (ref 98–111)
Creatinine, Ser: 1.02 mg/dL — ABNORMAL HIGH (ref 0.44–1.00)
GFR calc Af Amer: >60 mL/min (ref >60)
GFR calc non Af Amer: >60 mL/min (ref >60)
Glucose, Bld: 108 mg/dL — ABNORMAL HIGH (ref 70–99)
Potassium: 3.8 mmol/L (ref 3.5–5.1)
Sodium: 140 mmol/L (ref 135–145)
Total Bilirubin: 0.2 mg/dL — ABNORMAL LOW (ref 0.3–1.2)
Total Protein: 6.8 g/dL (ref 6.5–8.1)

## 2018-11-08 LAB — CBC WITH DIFFERENTIAL/PLATELET
Abs Immature Granulocytes: 0.01 10*3/uL (ref 0.00–0.07)
Basophils Absolute: 0 10*3/uL (ref 0.0–0.1)
Basophils Relative: 1 %
Eosinophils Absolute: 0 10*3/uL (ref 0.0–0.5)
Eosinophils Relative: 1 %
HCT: 36.6 % (ref 36.0–46.0)
Hemoglobin: 12 g/dL (ref 12.0–15.0)
Immature Granulocytes: 0 %
Lymphocytes Relative: 25 %
Lymphs Abs: 1 10*3/uL (ref 0.7–4.0)
MCH: 30.5 pg (ref 26.0–34.0)
MCHC: 32.8 g/dL (ref 30.0–36.0)
MCV: 93.1 fL (ref 80.0–100.0)
Monocytes Absolute: 0.3 10*3/uL (ref 0.1–1.0)
Monocytes Relative: 7 %
Neutro Abs: 2.7 10*3/uL (ref 1.7–7.7)
Neutrophils Relative %: 66 %
Platelets: 192 10*3/uL (ref 150–400)
RBC: 3.93 MIL/uL (ref 3.87–5.11)
RDW: 12.2 % (ref 11.5–15.5)
WBC: 4.1 10*3/uL (ref 4.0–10.5)
nRBC: 0 % (ref 0.0–0.2)

## 2018-11-08 MED ORDER — TRASTUZUMAB CHEMO 150 MG IV SOLR
450.0000 mg | Freq: Once | INTRAVENOUS | Status: AC
  Administered 2018-11-08: 450 mg via INTRAVENOUS
  Filled 2018-11-08: qty 21.43

## 2018-11-08 MED ORDER — SODIUM CHLORIDE 0.9 % IV SOLN
Freq: Once | INTRAVENOUS | Status: AC
  Administered 2018-11-08: 09:00:00 via INTRAVENOUS
  Filled 2018-11-08: qty 250

## 2018-11-08 MED ORDER — SODIUM CHLORIDE 0.9% FLUSH
10.0000 mL | INTRAVENOUS | Status: DC | PRN
  Administered 2018-11-08: 10 mL
  Filled 2018-11-08: qty 10

## 2018-11-08 MED ORDER — HEPARIN SOD (PORK) LOCK FLUSH 100 UNIT/ML IV SOLN
500.0000 [IU] | Freq: Once | INTRAVENOUS | Status: AC | PRN
  Administered 2018-11-08: 500 [IU]
  Filled 2018-11-08: qty 5

## 2018-11-08 NOTE — Patient Instructions (Signed)
[facility] Discharge Instructions for Patients Receiving Chemotherapy  Today you received the following chemotherapy agents Trastuzumab (HERCEPTIN).  To help prevent nausea and vomiting after your treatment, we encourage you to take your nausea medication as prescribed.   If you develop nausea and vomiting that is not controlled by your nausea medication, call the clinic.   BELOW ARE SYMPTOMS THAT SHOULD BE REPORTED IMMEDIATELY:  *FEVER GREATER THAN 100.5 F  *CHILLS WITH OR WITHOUT FEVER  NAUSEA AND VOMITING THAT IS NOT CONTROLLED WITH YOUR NAUSEA MEDICATION  *UNUSUAL SHORTNESS OF BREATH  *UNUSUAL BRUISING OR BLEEDING  TENDERNESS IN MOUTH AND THROAT WITH OR WITHOUT PRESENCE OF ULCERS  *URINARY PROBLEMS  *BOWEL PROBLEMS  UNUSUAL RASH Items with * indicate a potential emergency and should be followed up as soon as possible.  Feel free to call the clinic should you have any questions or concerns. The clinic phone number is [phone].  Please show the CHEMO ALERT CARD at check-in to the Emergency Department and triage nurse.   

## 2018-11-09 LAB — CANCER ANTIGEN 27.29: CA 27.29: 15.6 U/mL (ref 0.0–38.6)

## 2018-11-17 ENCOUNTER — Telehealth: Payer: Self-pay

## 2018-11-17 NOTE — Telephone Encounter (Signed)
Pt called regarding shoulder discomfort that she had seen Sandi Mealy, PA for about two months ago. Per verbalized that a CT was completed and dose pack discussed, but not prescribed. Pt has recently started to experience throbbing pain in her L shoulder, and was requesting assistance as to how she should proceed. Per Lucianne Lei, [place], pt to call [facility] to schedule an appt. Pt should not need a referral, but is welcome to call back if one is requested by the office before proceeding. Pt verbalized understanding of plan and office number given from website [phone].

## 2018-11-18 ENCOUNTER — Telehealth: Payer: Self-pay

## 2018-11-18 NOTE — Telephone Encounter (Signed)
Called [facility] to have disc prepared with imaging from 10/04/18 in preparation for pt visit with [facility] on Monday (11/21/18). Spoke with pt who communicated that [facility] said they would be picking up the disc. Called [facility] office and secretary confirmed that their doctors have access to Epic and should not need a disc created. Cancelled the order with [facility] and LVM with pt to relay the msg.

## 2018-11-22 ENCOUNTER — Other Ambulatory Visit: Payer: Self-pay | Admitting: Oncology

## 2018-12-06 ENCOUNTER — Inpatient Hospital Stay: Payer: Medicaid Other | Attending: Medical

## 2018-12-06 ENCOUNTER — Telehealth: Payer: Self-pay | Admitting: Adult Health

## 2018-12-06 ENCOUNTER — Inpatient Hospital Stay (HOSPITAL_BASED_OUTPATIENT_CLINIC_OR_DEPARTMENT_OTHER): Payer: Medicaid Other | Admitting: Adult Health

## 2018-12-06 ENCOUNTER — Inpatient Hospital Stay: Payer: Medicaid Other

## 2018-12-06 ENCOUNTER — Encounter: Payer: Self-pay | Admitting: Adult Health

## 2018-12-06 VITALS — BP 119/81 | HR 87 | Temp 97.9°F | Resp 18 | Ht 65.0 in | Wt 164.2 lb

## 2018-12-06 DIAGNOSIS — C50312 Malignant neoplasm of lower-inner quadrant of left female breast: Secondary | ICD-10-CM | POA: Insufficient documentation

## 2018-12-06 DIAGNOSIS — Z9012 Acquired absence of left breast and nipple: Secondary | ICD-10-CM

## 2018-12-06 DIAGNOSIS — C50919 Malignant neoplasm of unspecified site of unspecified female breast: Secondary | ICD-10-CM

## 2018-12-06 DIAGNOSIS — C7931 Secondary malignant neoplasm of brain: Secondary | ICD-10-CM | POA: Insufficient documentation

## 2018-12-06 DIAGNOSIS — Z5112 Encounter for antineoplastic immunotherapy: Secondary | ICD-10-CM | POA: Diagnosis present

## 2018-12-06 DIAGNOSIS — F319 Bipolar disorder, unspecified: Secondary | ICD-10-CM

## 2018-12-06 DIAGNOSIS — C7951 Secondary malignant neoplasm of bone: Secondary | ICD-10-CM | POA: Diagnosis not present

## 2018-12-06 DIAGNOSIS — Z170 Estrogen receptor positive status [ER+]: Secondary | ICD-10-CM

## 2018-12-06 LAB — COMPREHENSIVE METABOLIC PANEL
ALT: 12 U/L (ref 0–44)
AST: 13 U/L — ABNORMAL LOW (ref 15–41)
Albumin: 3.9 g/dL (ref 3.5–5.0)
Alkaline Phosphatase: 62 U/L (ref 38–126)
Anion gap: 12 (ref 5–15)
BUN: 12 mg/dL (ref 6–20)
CO2: 24 mmol/L (ref 22–32)
Calcium: 9.4 mg/dL (ref 8.9–10.3)
Chloride: 105 mmol/L (ref 98–111)
Creatinine, Ser: 1.08 mg/dL — ABNORMAL HIGH (ref 0.44–1.00)
GFR calc Af Amer: >60 mL/min (ref >60)
GFR calc non Af Amer: 57 mL/min — ABNORMAL LOW (ref >60)
Glucose, Bld: 91 mg/dL (ref 70–99)
Potassium: 4.1 mmol/L (ref 3.5–5.1)
Sodium: 141 mmol/L (ref 135–145)
Total Bilirubin: 0.3 mg/dL (ref 0.3–1.2)
Total Protein: 7.1 g/dL (ref 6.5–8.1)

## 2018-12-06 LAB — CBC WITH DIFFERENTIAL/PLATELET
Abs Immature Granulocytes: 0.01 10*3/uL (ref 0.00–0.07)
Basophils Absolute: 0 10*3/uL (ref 0.0–0.1)
Basophils Relative: 1 %
Eosinophils Absolute: 0 10*3/uL (ref 0.0–0.5)
Eosinophils Relative: 0 %
HCT: 40.2 % (ref 36.0–46.0)
Hemoglobin: 12.9 g/dL (ref 12.0–15.0)
Immature Granulocytes: 0 %
Lymphocytes Relative: 19 %
Lymphs Abs: 1 10*3/uL (ref 0.7–4.0)
MCH: 30.1 pg (ref 26.0–34.0)
MCHC: 32.1 g/dL (ref 30.0–36.0)
MCV: 93.7 fL (ref 80.0–100.0)
Monocytes Absolute: 0.4 10*3/uL (ref 0.1–1.0)
Monocytes Relative: 7 %
Neutro Abs: 3.9 10*3/uL (ref 1.7–7.7)
Neutrophils Relative %: 73 %
Platelets: 166 10*3/uL (ref 150–400)
RBC: 4.29 MIL/uL (ref 3.87–5.11)
RDW: 12.4 % (ref 11.5–15.5)
WBC: 5.3 10*3/uL (ref 4.0–10.5)
nRBC: 0 % (ref 0.0–0.2)

## 2018-12-06 MED ORDER — HEPARIN SOD (PORK) LOCK FLUSH 100 UNIT/ML IV SOLN
500.0000 [IU] | Freq: Once | INTRAVENOUS | Status: AC | PRN
  Administered 2018-12-06: 500 [IU]
  Filled 2018-12-06: qty 5

## 2018-12-06 MED ORDER — TRASTUZUMAB CHEMO 150 MG IV SOLR
450.0000 mg | Freq: Once | INTRAVENOUS | Status: AC
  Administered 2018-12-06: 450 mg via INTRAVENOUS
  Filled 2018-12-06: qty 21.43

## 2018-12-06 MED ORDER — DENOSUMAB 120 MG/1.7ML ~~LOC~~ SOLN
120.0000 mg | Freq: Once | SUBCUTANEOUS | Status: AC
  Administered 2018-12-06: 120 mg via SUBCUTANEOUS

## 2018-12-06 MED ORDER — SODIUM CHLORIDE 0.9 % IV SOLN
Freq: Once | INTRAVENOUS | Status: AC
  Administered 2018-12-06: 10:00:00 via INTRAVENOUS
  Filled 2018-12-06: qty 250

## 2018-12-06 MED ORDER — SODIUM CHLORIDE 0.9% FLUSH
10.0000 mL | INTRAVENOUS | Status: DC | PRN
  Administered 2018-12-06: 10 mL
  Filled 2018-12-06: qty 10

## 2018-12-06 MED ORDER — DENOSUMAB 120 MG/1.7ML ~~LOC~~ SOLN
SUBCUTANEOUS | Status: AC
  Filled 2018-12-06: qty 1.7

## 2018-12-06 NOTE — Patient Instructions (Signed)
[facility] Discharge Instructions for Patients Receiving Chemotherapy  Today you received the following chemotherapy agents: Trastuzumab (Herceptin)  To help prevent nausea and vomiting after your treatment, we encourage you to take your nausea medication as directed.    If you develop nausea and vomiting that is not controlled by your nausea medication, call the clinic.   BELOW ARE SYMPTOMS THAT SHOULD BE REPORTED IMMEDIATELY:  *FEVER GREATER THAN 100.5 F  *CHILLS WITH OR WITHOUT FEVER  NAUSEA AND VOMITING THAT IS NOT CONTROLLED WITH YOUR NAUSEA MEDICATION  *UNUSUAL SHORTNESS OF BREATH  *UNUSUAL BRUISING OR BLEEDING  TENDERNESS IN MOUTH AND THROAT WITH OR WITHOUT PRESENCE OF ULCERS  *URINARY PROBLEMS  *BOWEL PROBLEMS  UNUSUAL RASH Items with * indicate a potential emergency and should be followed up as soon as possible.  Feel free to call the clinic should you have any questions or concerns. The clinic phone number is [phone].  Please show the [facility] at check-in to the Emergency Department and triage nurse.  Denosumab injection What is this medicine? DENOSUMAB (den oh sue mab) slows bone breakdown. Prolia is used to treat osteoporosis in women after menopause and in men, and in people who are taking corticosteroids for 6 months or more. Delton See is used to treat a high calcium level due to cancer and to prevent bone fractures and other bone problems caused by multiple myeloma or cancer bone metastases. Delton See is also used to treat giant cell tumor of the bone. This medicine may be used for other purposes; ask your health care provider or pharmacist if you have questions. COMMON BRAND NAME(S): Prolia, XGEVA What should I tell my health care provider before I take this medicine? They need to know if you have any of these conditions: -dental disease -having surgery or tooth extraction -infection -kidney disease -low levels of calcium or Vitamin D  in the blood -malnutrition -on hemodialysis -skin conditions or sensitivity -thyroid or parathyroid disease -an unusual reaction to denosumab, other medicines, foods, dyes, or preservatives -pregnant or trying to get pregnant -breast-feeding How should I use this medicine? This medicine is for injection under the skin. It is given by a health care professional in a hospital or clinic setting. A special MedGuide will be given to you before each treatment. Be sure to read this information carefully each time. For Prolia, talk to your pediatrician regarding the use of this medicine in children. Special care may be needed. For Delton See, talk to your pediatrician regarding the use of this medicine in children. While this drug may be prescribed for children as young as 13 years for selected conditions, precautions do apply. Overdosage: If you think you have taken too much of this medicine contact a poison control center or emergency room at once. NOTE: This medicine is only for you. Do not share this medicine with others. What if I miss a dose? It is important not to miss your dose. Call your doctor or health care professional if you are unable to keep an appointment. What may interact with this medicine? Do not take this medicine with any of the following medications: -other medicines containing denosumab This medicine may also interact with the following medications: -medicines that lower your chance of fighting infection -steroid medicines like prednisone or cortisone This list may not describe all possible interactions. Give your health care provider a list of all the medicines, herbs, non-prescription drugs, or dietary supplements you use. Also tell them if you smoke, drink  alcohol, or use illegal drugs. Some items may interact with your medicine. What should I watch for while using this medicine? Visit your doctor or health care professional for regular checks on your progress. Your doctor or  health care professional may order blood tests and other tests to see how you are doing. Call your doctor or health care professional for advice if you get a fever, chills or sore throat, or other symptoms of a cold or flu. Do not treat yourself. This drug may decrease your body's ability to fight infection. Try to avoid being around people who are sick. You should make sure you get enough calcium and vitamin D while you are taking this medicine, unless your doctor tells you not to. Discuss the foods you eat and the vitamins you take with your health care professional. See your dentist regularly. Brush and floss your teeth as directed. Before you have any dental work done, tell your dentist you are receiving this medicine. Do not become pregnant while taking this medicine or for 5 months after stopping it. Talk with your doctor or health care professional about your birth control options while taking this medicine. Women should inform their doctor if they wish to become pregnant or think they might be pregnant. There is a potential for serious side effects to an unborn child. Talk to your health care professional or pharmacist for more information. What side effects may I notice from receiving this medicine? Side effects that you should report to your doctor or health care professional as soon as possible: -allergic reactions like skin rash, itching or hives, swelling of the face, lips, or tongue -bone pain -breathing problems -dizziness -jaw pain, especially after dental work -redness, blistering, peeling of the skin -signs and symptoms of infection like fever or chills; cough; sore throat; pain or trouble passing urine -signs of low calcium like fast heartbeat, muscle cramps or muscle pain; pain, tingling, numbness in the hands or feet; seizures -unusual bleeding or bruising -unusually weak or tired Side effects that usually do not require medical attention (report to your doctor or health care  professional if they continue or are bothersome): -constipation -diarrhea -headache -joint pain -loss of appetite -muscle pain -runny nose -tiredness -upset stomach This list may not describe all possible side effects. Call your doctor for medical advice about side effects. You may report side effects to FDA at 1-800-FDA-1088. Where should I keep my medicine? This medicine is only given in a clinic, doctor's office, or other health care setting and will not be stored at home. NOTE: This sheet is a summary. It may not cover all possible information. If you have questions about this medicine, talk to your doctor, pharmacist, or health care provider.  2019 [employer] Standard (2018-03-25 16:10:44)

## 2018-12-06 NOTE — Telephone Encounter (Signed)
Per 1/7 no los 

## 2018-12-06 NOTE — Progress Notes (Signed)
[facility]  [phone] [phone]     ID: [name] DOB: 1961-07-27  MR#: [id number]  PJS#:[id number]  Patient Care Team: Charlott Rakes, MD as PCP - General (Family Medicine) Kyung Rudd, MD as Consulting Physician (Radiation Oncology) Erline Levine, MD as Consulting Physician (Neurosurgery) Corena Pilgrim, MD as Consulting Physician (Psychiatry) Mickeal Skinner Acey Lav, MD as Consulting Physician (Psychiatry) Nehemiah Settle, MD as Referring Physician (Plastic Surgery) Irene Limbo, MD as Consulting Physician (Plastic Surgery)  CHIEF COMPLAINT: Metastatic triple positive breast cancer  CURRENT TREATMENT: Trastuzumab, denosumab, anastrozole   INTERVAL HISTORY: [name] returns today for a follow-up and treatment of her metastatic triple positive breast cancer. She continues on anastrozole with good tolerance.    She also receives trastuzumab every 28 days, with a dose due today.   She receives [place] every 12 weeks with a dose due today.  She denies any dental issues, jaw or mouth concerns, or recent dental work.  She completed an echocardiogram on 06/30/2018 that was normal.  Her repeat echo is due in late January, early February, and this is not yet scheduled.  She also has CT chest due in early February that is not yet scheduled.  She has her mammogram and MRI scheduled in early February.  She is feeling well today.    REVIEW OF SYSTEMS: [name] is doing well today.  She is not fatigued.  She has lost 6 pounds.  She says she has eliminated sweets.  She denies any fevers, chills, hot flashes, arthralgias.  She is without chest pain, cough, shortness of breath, palpitations.  She denies any bowel/bladder changes, nausea, or vomiting.  Her activity level is good.  She enjoyed her holiday with her family and grandchildren.  She is working with [employer].  She does not intentionally exercise.  A detailed ROS was  conducted and was otherwise non contributory.    BREAST CANCER HISTORY: From the original intake note:  [name] was aware of a "lemon sized lump in" her left axilla for about a year before bringing it to medical attention. By then she had developed left breast and left axillary swelling (June 2016). She presented to the local emergency room and had a chest CT scan 06/06/2015 which showed a nodule in the left breast measuring 0.9 cm and questionable left axillary adenopathy. She then proceeded to bilateral diagnostic mammography and left breast ultrasonography 06/19/2015. There were no prior films for comparison (last mammography 12 years prior).. The breast density was category C. Mammography showed in the left breast upper inner quadrant a 7 cm area including a small mass and significant pleomorphic calcifications. Ultrasonography defined the mass as measuring 1.2 cm. The left axilla appeared unremarkable. There was significant skin edema.  Biopsy of the left breast mass 06/19/2015 showed ([id number]) an invasive ductal carcinoma, grade 2, estrogen receptor 83% positive, progesterone receptor 26% positive, and HER-2 amplified by immunohistochemstry with a 3+ reading. The patient had biopsies of a separate area in the left breast August of the same year and this showed atypical ductal hyperplasia. (SP [id number]).  Accordingly after appropriate discussion on 08/21/2015 the patient proceeded to left mastectomy with left axillary sentinel lymph node sampling, which, since the lymph nodes were positive, extended to the procedure to left axillary lymph node dissection. The pathology ([id number]) showed an invasive ductal carcinoma, grade 3, measuring in excess of 9 cm. There were also skin satellites, not contiguous with the invasive carcinoma. Margins were  clear and ample. There was evidence of lymphovascular invasion. A total of 15 lymph nodes were removed, including 5 sentinel lymph nodes, all of which were  positive, so that the final total was 14 out of 15 lymph nodes involved by tumor. There was evidence of extranodal extension. The final pathology was pT4b pN2a, stage IIIB  CA-27-29 and CEA 09/19/2015 were non-informative October 2016.  Unfortunately CT scans of the chest abdomen and pelvis 09/16/2015 showed bony metastases to the right scapula, left iliac crest, and also L4 and T-spine. There were questionable liver cysts which on repeat CT scan 03/02/2016 appear to be a little bit more well-defined, possibly a little larger. There were also some possible right upper lobe lung lesions.  Adjuvant treatment consisted of docetaxel, trastuzumab and pertuzumab, with the final (6th) docetaxel dose given 02/11/2016. She continues on trastuzumab and pertuzumab, with the 11th cycle given 05/05/2016. Echocardiogram 02/26/2016 showed an ejection fraction of 55%. She receives denosumab/Xgeva every 4 weeks.. She also receives radiation, started 06/09/201, to be completed 06/26/2016.  Her subsequent history is as detailed below   PAST MEDICAL HISTORY: Past Medical History:  Diagnosis Date  . Alcohol abuse   . Anemia    during chemo  . Anxiety    At age 44  . Arthritis Dx 2010  . Bipolar disorder ([place])   . Bronchitis   . Cancer ([place])    breast mets to brain  . Chronic pain   . Complication of anesthesia   . Depression   . Family history of adverse reaction to anesthesia    parents had PONV  . Fibromyalgia Dx 2005  . GERD (gastroesophageal reflux disease)   . Headache    hx  migraines  . Lymphedema of left arm   . Opiate dependence ([place])   . PONV (postoperative nausea and vomiting)   . Port-A-Cath in place   . PTSD (post-traumatic stress disorder)      PAST SURGICAL HISTORY: Past Surgical History:  Procedure Laterality Date  . APPLICATION OF CRANIAL NAVIGATION N/A 08/14/2016   Procedure: APPLICATION OF CRANIAL NAVIGATION;  Surgeon: Erline Levine, MD;  Location: [facility];  Service:  Neurosurgery;  Laterality: N/A;  . BREAST RECONSTRUCTION Left    with silicone implant  . CRANIOTOMY N/A 08/14/2016   Procedure: CRANIOTOMY TUMOR EXCISION WITH Lucky Rathke;  Surgeon: Erline Levine, MD;  Location: [facility];  Service: Neurosurgery;  Laterality: N/A;  . FIBULA FRACTURE SURGERY Left   . MASTECTOMY Left   . RADIOLOGY WITH ANESTHESIA N/A 07/23/2016   Procedure: MRI OF BRAIN WITH AND WITHOUT;  Surgeon: Medication Radiologist, MD;  Location: [facility];  Service: Radiology;  Laterality: N/A;  . RADIOLOGY WITH ANESTHESIA N/A 09/08/2016   Procedure: MRI OF BRAIN WITH AND WITHOUT CONTRAST;  Surgeon: Medication Radiologist, MD;  Location: [facility];  Service: Radiology;  Laterality: N/A;  . RADIOLOGY WITH ANESTHESIA N/A 12/10/2016   Procedure: MRI OF BRAIN WITH AND WITHOUT;  Surgeon: Medication Radiologist, MD;  Location: [facility];  Service: Radiology;  Laterality: N/A;  . RADIOLOGY WITH ANESTHESIA N/A 03/02/2017   Procedure: MRI of BRAIN W and W/OUT CONTRAST;  Surgeon: Medication Radiologist, MD;  Location: [facility];  Service: Radiology;  Laterality: N/A;  . RADIOLOGY WITH ANESTHESIA N/A 07/29/2017   Procedure: RADIOLOGY WITH ANESTHESIA MRI OF BRAIN WITH AND WITHOUT CONTRAST;  Surgeon: Radiologist, Medication, MD;  Location: [facility];  Service: Radiology;  Laterality: N/A;  . RADIOLOGY WITH ANESTHESIA N/A 12/07/2017   Procedure: MRI  WITH ANESTHESIA OF BRAIN WITH AND WITHOUT CONTRAST;  Surgeon: Radiologist, Medication, MD;  Location: [facility];  Service: Radiology;  Laterality: N/A;  . RADIOLOGY WITH ANESTHESIA N/A 04/07/2018   Procedure: MRI OF BRAIN WITH AND WITHOUT CONTRAST;  Surgeon: Radiologist, Medication, MD;  Location: [facility];  Service: Radiology;  Laterality: N/A;  . RADIOLOGY WITH ANESTHESIA N/A 08/23/2018   Procedure: MRI WITH ANESTHESIA OF THE BRAIN WITH AND WITHOUT;  Surgeon: Radiologist, Medication, MD;  Location: [facility];  Service: Radiology;  Laterality: N/A;  . right power port placement Right       FAMILY HISTORY Family History  Problem Relation Age of Onset  . Diabetes Mother   . Bipolar disorder Mother   . CAD Father    The patient's father still living, age 2, in [place]. He had prostate cancer at some point in the past. The patient's mother died at age 86 from complications of diabetes. The patient had no brothers, 2 sisters. A paternal grandmother had lung cancer in the setting of tobacco abuse. There is no other history of cancer in the family to her knowledge  GYNECOLOGIC HISTORY:  No LMP recorded. Patient has had an ablation. Menarche approximately age 58. First live birth in [birth year]. The patient is GX P2. She underwent endometrial ablation in 2016.  SOCIAL HISTORY: Updated August 2019 The patient is originally from [place]. She has lived in [place] before but more recently was in [place]. She is back here because she could not afford her rent in [place]. She is living here and a temporary situation. She is divorced. Her 2 children are [name] who lives in [place] and works as a Development worker, community, and [name] who also lives in [place] and works as a Catering manager. The patient has a grandchild, Arelia Longest, 69 years old as of July 2019, living in [place] with his mother. The patient also has a grandson born in February 2019, who also lives in [place]. The patient has not established herself with a local church yet.    ADVANCED DIRECTIVES: Not in place; at the 06/03/2016 visit the patient was given the appropriate forms to complete and notarize at her discretion   HEALTH MAINTENANCE: Social History   Tobacco Use  . Smoking status: Current Every Day Smoker    Packs/day: 0.50    Types: Cigarettes  . Smokeless tobacco: Never Used  Substance Use Topics  . Alcohol use: No    Comment: no ETOH since 08/22/12  . Drug use: No    Comment: states she's in recovery program for 5 years     Colonoscopy:  PAP:  Bone density:   Allergies  Allergen Reactions  .  Demerol Itching and Nausea And Vomiting  . Erythromycin Rash    Current Outpatient Medications on File Prior to Visit  Medication Sig Dispense Refill  . anastrozole (ARIMIDEX) 1 MG tablet TAKE 1 TABLET BY MOUTH EVERY DAY 90 tablet 1  . Biotin 1000 MCG tablet Take 1,000 mcg by mouth daily.    . Cholecalciferol (VITAMIN D3) 5000 units CAPS Take 5,000 Units by mouth daily.    . cyclobenzaprine (FLEXERIL) 10 MG tablet Take 1 tablet (10 mg total) by mouth daily. 30 tablet 0  . docusate sodium (COLACE) 100 MG capsule Take 100 mg by mouth daily as needed for mild constipation.     . fluticasone (FLONASE) 50 MCG/ACT nasal spray SPRAY 2 SPRAYS INTO EACH NOSTRIL EVERY DAY 16 g 2  . gabapentin (NEURONTIN) 300 MG capsule Take 1 capsule (  300 mg total) by mouth 2 (two) times daily. (Patient taking differently: Take 300-600 mg by mouth See admin instructions. Take 300 mg by mouth in the morning and take 600 mg by mouth at bedtime) 60 capsule 3  . ibuprofen (ADVIL,MOTRIN) 800 MG tablet TAKE 1 TABLET BY MOUTH THREE TIMES A DAY 90 tablet 0  . lamoTRIgine (LAMICTAL) 25 MG tablet Take 1 tablet (25 mg total) by mouth 2 (two) times daily. (Patient taking differently: Take 75 mg by mouth daily. ) 60 tablet 0  . loratadine (CLARITIN) 10 MG tablet TAKE 1 TABLET BY MOUTH EVERY DAY (Patient taking differently: Take 10 mg by mouth daily. ) 30 tablet 11  . lurasidone (LATUDA) 40 MG TABS tablet Take 1 tablet (40 mg total) by mouth daily with breakfast. 30 tablet 0  . metroNIDAZOLE (METROGEL VAGINAL) 0.75 % vaginal gel Place 1 Applicatorful vaginally at bedtime. 70 g 0  . ondansetron (ZOFRAN) 8 MG tablet TAKE 1 TABLET (8 MG TOTAL) BY MOUTH EVERY 8 (EIGHT) HOURS AS NEEDED FOR NAUSEA OR VOMITING 90 tablet 0  . pantoprazole (PROTONIX) 40 MG tablet Take 1 tablet (40 mg total) by mouth daily. 30 tablet 2  . polyethylene glycol (MIRALAX / GLYCOLAX) packet TAKE 17 G BY MOUTH DAILY AS NEEDED FOR MILD CONSTIPATION. 30 packet 0  .  traZODone (DESYREL) 100 MG tablet Take 1 tablet (100 mg total) by mouth at bedtime. (Patient taking differently: Take 200 mg by mouth at bedtime. ) 30 tablet 0  . varenicline (CHANTIX) 0.5 MG tablet Take 0.5 mg by mouth daily.     No current facility-administered medications on file prior to visit.      OBJECTIVE:  Vitals:   12/06/18 0918  BP: 119/81  Pulse: 87  Resp: 18  Temp: 97.9 F (36.6 C)  TempSrc: Oral  SpO2: 98%  Weight: 164 lb 3.2 oz (74.5 kg)  Height: 5' 5"  (1.651 m)  Body mass index is 27.32 kg/m.  ECOG FS: 1 - Symptomatic but completely ambulatory GENERAL: Patient is a well appearing female in no acute distress HEENT:  Sclerae anicteric.  Oropharynx clear and moist. No ulcerations or evidence of oropharyngeal candidiasis. Neck is supple.  NODES:  No cervical, supraclavicular, or axillary lymphadenopathy palpated.  BREAST EXAM:  Right breast is without any nodules, masses, skin or nipple changes, left breast is s/p mastectomy and implant placement, slight amount of swelling above left breast no nodularity or sign of local recurrence noted LUNGS:  Clear to auscultation bilaterally.  No wheezes or rhonchi. HEART:  Regular rate and rhythm. No murmur appreciated. ABDOMEN:  Soft, nontender.  Positive, normoactive bowel sounds. No organomegaly palpated. MSK:  No focal spinal tenderness to palpation. Full range of motion bilaterally in the upper extremities.    EXTREMITIES:  No peripheral edema.   SKIN:  Clear with no obvious rashes or skin changes. No nail dyscrasia. NEURO:  Nonfocal. Well oriented.  Appropriate affect.    LAB RESULTS: No results found for: LABCA2  CBC    Component Value Date/Time   WBC 5.3 12/06/2018 0858   RBC 4.29 12/06/2018 0858   HGB 12.9 12/06/2018 0858   HGB 12.1 10/26/2017 1038   HCT 40.2 12/06/2018 0858   HCT 35.7 10/26/2017 1038   PLT 166 12/06/2018 0858   PLT 167 10/26/2017 1038   MCV 93.7 12/06/2018 0858   MCV 98.4 10/26/2017 1038    MCH 30.1 12/06/2018 0858   MCHC 32.1 12/06/2018 0858   RDW 12.4  12/06/2018 0858   RDW 13.3 10/26/2017 1038   LYMPHSABS 1.0 12/06/2018 0858   LYMPHSABS 0.5 (L) 10/26/2017 1038   MONOABS 0.4 12/06/2018 0858   MONOABS 0.1 10/26/2017 1038   EOSABS 0.0 12/06/2018 0858   EOSABS 0.0 10/26/2017 1038   BASOSABS 0.0 12/06/2018 0858   BASOSABS 0.0 10/26/2017 1038   CMP Latest Ref Rng & Units 11/08/2018 10/11/2018 09/13/2018  Glucose 70 - 99 mg/dL 108(H) 104(H) 114(H)  BUN 6 - 20 mg/dL 11 13 14   Creatinine 0.44 - 1.00 mg/dL 1.02(H) 0.99 1.22(H)  Sodium 135 - 145 mmol/L 140 140 139  Potassium 3.5 - 5.1 mmol/L 3.8 3.7 4.1  Chloride 98 - 111 mmol/L 106 105 101  CO2 22 - 32 mmol/L 25 27 27   Calcium 8.9 - 10.3 mg/dL 8.8(L) 9.1 10.0  Total Protein 6.5 - 8.1 g/dL 6.8 6.7 7.3  Total Bilirubin 0.3 - 1.2 mg/dL 0.2(L) 0.3 0.3  Alkaline Phos 38 - 126 U/L 62 72 78  AST 15 - 41 U/L 15 15 17   ALT 0 - 44 U/L 11 12 15      STUDIES: No results found.  ELIGIBLE FOR AVAILABLE RESEARCH PROTOCOL: no  ASSESSMENT: 58 y.o. 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  (2) status post left mastectomy and axillary lymph node dissection  with immediate expander placement 07/18/2015 for an mpT4 pN2,stage IIIB invasive ductal carcinoma, grade 3, with negative margins.  (a) definitive implant exchange to be scheduled in December   METASTATIC DISEASE: October 2016  (3) CT scan of the chest abdomen and pelvis  09/16/2015 shows metastatic lesions in the right scapula, left iliac crest, L4, and T spine. There were questionable liver cysts, with repeat CT scan 03/02/2016 showing possible right upper lobe lung lesions and possibly increased liver lesions  (a) CT scan of the chest 06/17/2016 shows no active disease in the lungs or  liver  (b) Bone scan July 2017 showed no evidence of bony metastatic disease   (c) head CT 07/08/2016 showed a cerebellar lesion, confirmed by MRI 07/23/2016, status post craniotomy 08/14/2016, confirming a metastatic deposit which was estrogen and progesterone receptor negative, HER-2 amplified with a signals ratio of 7.16, number per cell 13.25  (d) CA 27-29 is not informative  (4) received docetaxel every 3 weeks 6 together with trastuzumab and pertuzumab, last docetaxel dose 02/11/2016  (5) adjuvant radiation7/03/2016 to 06/26/2016 at [facility]: 1. The Left chest wall was treated to 23.4 Gy in 13 fractions at 1.8 Gy per fraction. 2. The Left chest wall was boosted to 10 Gy in 5 fractions at 2 Gy per fraction. 3. The Left Sclav/PAB was treated to 23.4 Gy in 13 fractions at 1.8 Gy per fraction.  [Note: Including the patient's treatment in [facility] (received 15 fractions per Dr. Maryan Rued near [place], [place]), the patient received 50.4 Gy to the left chest wall and supraclavicular region. ]  (6) started trastuzumab and pertuzumab October 2016, continuing every 3 weeks,  (a) echocardiogram 02/26/2016 showed a well preserved ejection fraction  (b) echocardiogram 07/01/2016 shows an ejection fraction in the 60-65%   (c) pertuzumab discontinued 10/2016 with uncontrolled diarrhea  (d) echocardiogram 11/11/2016 showed an ejection fraction in the 60-65%  (e) echocardiogram 03/03/2017 shows an ejection fraction of 60-65%  (f) echocardiogram on 05/19/2017 shows an ejection fraction of 55-60%  (  g) echocardiogram 09/24/2017 shows the ejection fraction in the 60-65%  (h) echocardiogram 02/14/2018 shows an ejection fraction in the 60-65%  (I) echocardiogram  06/30/2018 shows an ejection fraction in the 55-60%    (7) started denosumab/Xgeva October 2017 given every 4 weeks, transitioned to every 8 weeks beginning 10/11/18  (8) started anastrozole October 2017   (a) bone scan 11/10/2016 shows no  active disease  (b) chest CT scan 11/10/2016 stable, with no evidence of active disease  (c) chest CT and bone scan 07/02/2017 show no evidence of active disease  (d) CT scan of the chest with contrast 11/10/2017 shows some left axillary edema, but no evidence of thrombosis or adenopathy in that area, 0.9 cm precarinal lymph and 0.7 cm right upper lobe nodule node; bone lesions stable  (e) CT of the chest 05/04/2018 shows a 1.4 cm right lower paratracheal node which is slightly increased and a new right prevascular mediastinal node measuring 0.7 cm.  Bone lesions are stable.  (f) chest CT on 07/01/2018 shows no definite findings of metastatic disease in the thorax. 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  (a) brain irradiation10/19/17-11/08/17: Whole brain/ 35 Gy in 14 fractions   (b) repeat brain MRI obtained 12/10/2016 shows no active disease in the brain  (c) repeat brain MRI 03/02/2017 shows no evidence of residual or recurrent disease  (d) repeat brain MRI 07/29/2017 shows no evidence of residual or recurrent disease  (e) repeat brain MRI 12/07/2017 shows no evidence of disease recurrence.  There is progressive white matter change secondary to prior treatment.  (f) repeat brain MRI 04/07/2018 showed no evidence of disease\  (g) repeat brain MRI on 08/23/2018 shows no evidence of disease   PLAN:  Cait is doing well today.  She has no clinical sign of progression.  She is tolerating her treatment well and will  continue this.  My nurse called and got her echocardiogram, CT chest, and ultrasound of left breast scheduled.    Catia will return in 4 weeks for labs, f/u with Dr. [name], and her next trastuzumab.  She also sees Dr. Mickeal Skinner on 01/26/19.  She knows to call for any other issues that may develop before the next visit, as we can certainly see her sooner if needed.    A total of (20) minutes of face-to-face time was spent with this patient with greater than 50% of that time in counseling and care-coordination.  Wilber Bihari, NP  12/06/18 9:37 AM Medical Oncology and Hematology [facility] [address] [place], [place] [address] Tel. [phone]    Fax. [phone]

## 2018-12-06 NOTE — Addendum Note (Signed)
Addended by: Wilber Bihari C on: 12/06/2018 10:07 AM   Modules accepted: Level of Service

## 2018-12-07 LAB — CANCER ANTIGEN 27.29: CA 27.29: 12.2 U/mL (ref 0.0–38.6)

## 2018-12-08 ENCOUNTER — Ambulatory Visit (HOSPITAL_COMMUNITY)
Admission: RE | Admit: 2018-12-08 | Discharge: 2018-12-08 | Disposition: A | Discharge status: Home / Self Care | Payer: Medicaid Other | Source: Ambulatory Visit | Attending: Adult Health | Admitting: Adult Health

## 2018-12-08 DIAGNOSIS — Z9882 Breast implant status: Secondary | ICD-10-CM | POA: Insufficient documentation

## 2018-12-08 DIAGNOSIS — C7931 Secondary malignant neoplasm of brain: Secondary | ICD-10-CM | POA: Diagnosis not present

## 2018-12-08 DIAGNOSIS — F17210 Nicotine dependence, cigarettes, uncomplicated: Secondary | ICD-10-CM | POA: Diagnosis not present

## 2018-12-08 DIAGNOSIS — C50919 Malignant neoplasm of unspecified site of unspecified female breast: Secondary | ICD-10-CM | POA: Insufficient documentation

## 2018-12-08 DIAGNOSIS — C7951 Secondary malignant neoplasm of bone: Secondary | ICD-10-CM

## 2018-12-08 DIAGNOSIS — I517 Cardiomegaly: Secondary | ICD-10-CM | POA: Diagnosis not present

## 2018-12-08 NOTE — Progress Notes (Signed)
  Echocardiogram 2D Echocardiogram has been performed.  [name] 12/08/2018, 11:43 AM

## 2018-12-09 ENCOUNTER — Ambulatory Visit (INDEPENDENT_AMBULATORY_CARE_PROVIDER_SITE_OTHER): Payer: Medicaid Other | Admitting: Plastic Surgery

## 2018-12-09 ENCOUNTER — Encounter: Payer: Self-pay | Admitting: Plastic Surgery

## 2018-12-09 VITALS — BP 123/74 | HR 100 | Temp 98.0°F | Resp 14 | Ht 65.0 in | Wt 162.0 lb

## 2018-12-09 DIAGNOSIS — C7931 Secondary malignant neoplasm of brain: Secondary | ICD-10-CM | POA: Diagnosis not present

## 2018-12-09 DIAGNOSIS — C50312 Malignant neoplasm of lower-inner quadrant of left female breast: Secondary | ICD-10-CM | POA: Diagnosis not present

## 2018-12-09 DIAGNOSIS — F3130 Bipolar disorder, current episode depressed, mild or moderate severity, unspecified: Secondary | ICD-10-CM

## 2018-12-09 DIAGNOSIS — Z9012 Acquired absence of left breast and nipple: Secondary | ICD-10-CM

## 2018-12-09 DIAGNOSIS — Z9889 Other specified postprocedural states: Secondary | ICD-10-CM | POA: Insufficient documentation

## 2018-12-09 DIAGNOSIS — C50919 Malignant neoplasm of unspecified site of unspecified female breast: Secondary | ICD-10-CM

## 2018-12-09 DIAGNOSIS — Z923 Personal history of irradiation: Secondary | ICD-10-CM

## 2018-12-09 NOTE — Progress Notes (Signed)
Patient ID: [name], female    DOB: 23-May-1961, 58 y.o.   MRN: [id number]   Chief Complaint  Patient presents with  . Breast Problem    The patient is a 58 yrs old wf here for consultation for breast reconstruction.  The patient is referred here from Dr. [name].  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  She had a left mastectomy with axillary node dissection (14 / 15 positive nodes her nodal extension).  She was staged at pT4b, pN2a, stage IIIB.  She had and a CT from October 2016 with bone metastasis to the right scapula, left iliac crest and spine.  The CT from April 2017 was concerning for liver and lung lesions. She underwent adjuvant chemotherapy, left mastectomy with reconstruction.  She had a breast expander/ADM placed followed a Naturelle Inspira SRF 431 cc silicone implant at [facility]  She then had radiation (ended July 2017).  She has residual left arm lymphedema for which she gets physical therapy and has a compression sleeve.  She had left cerebellar metastatic disease which was treated by Dr. Vertell Limber.  She is scheduled for a CT scan on January 30 and an MRI February 25.  She is 5 feet 5 inches tall, weight equals 162 pounds up from 132 last year.  Preop bra equals 36B.  She gets Trastuzumab monthly. The EF from 2019 = 55 - 60%.  She is interested and improved symmetry as the right breast has grade 3 ptosis.  The left implant is superior and laterally placed.  It is very firm and likely has a grade 3 capsular contracture.  This is causing her pain and discomfort.  She is having to supplement in her bra due to the asymmetry and size discrepancy between the 2 breasts.  She is currently smoking half a pack a day.  She is a recovered alcoholic and drug abuser from 2011 and 2013. She also has a history of bipolar disease. She does have some family support and support from her [place] sponsor.   Review of Systems  Constitutional: Negative.   Negative for activity change and appetite change.  HENT: Negative.   Eyes: Negative.   Respiratory: Negative.   Cardiovascular: Negative.   Gastrointestinal: Negative.   Endocrine: Negative.   Genitourinary: Negative.   Musculoskeletal: Positive for back pain and neck pain.  Skin: Positive for color change. Negative for wound.  Psychiatric/Behavioral: Negative.     Past Medical History:  Diagnosis Date  . Alcohol abuse   . Anemia    during chemo  . Anxiety    At age 58  . Arthritis Dx 2010  . Bipolar disorder ([place])   . Bronchitis   . Cancer ([place])    breast mets to brain  . Chronic pain   . Complication of anesthesia   . Depression   . Family history of adverse reaction to anesthesia    parents had PONV  . Fibromyalgia Dx 2005  . GERD (gastroesophageal reflux disease)   . Headache    hx  migraines  . Lymphedema of left arm   . Opiate dependence ([place])   . PONV (postoperative nausea and vomiting)   . Port-A-Cath in place   . PTSD (post-traumatic stress disorder)     Past Surgical History:  Procedure Laterality Date  . APPLICATION OF CRANIAL NAVIGATION N/A 08/14/2016   Procedure: APPLICATION OF CRANIAL NAVIGATION;  Surgeon: Erline Levine, MD;  Location: [facility]  [facility];  Service: Neurosurgery;  Laterality: N/A;  . BREAST RECONSTRUCTION Left    with silicone implant  . CRANIOTOMY N/A 08/14/2016   Procedure: CRANIOTOMY TUMOR EXCISION WITH Lucky Rathke;  Surgeon: Erline Levine, MD;  Location: [facility];  Service: Neurosurgery;  Laterality: N/A;  . FIBULA FRACTURE SURGERY Left   . MASTECTOMY Left   . RADIOLOGY WITH ANESTHESIA N/A 07/23/2016   Procedure: MRI OF BRAIN WITH AND WITHOUT;  Surgeon: Medication Radiologist, MD;  Location: [facility];  Service: Radiology;  Laterality: N/A;  . RADIOLOGY WITH ANESTHESIA N/A 09/08/2016   Procedure: MRI OF BRAIN WITH AND WITHOUT CONTRAST;  Surgeon: Medication Radiologist, MD;  Location: [facility];  Service: Radiology;  Laterality: N/A;  . RADIOLOGY  WITH ANESTHESIA N/A 12/10/2016   Procedure: MRI OF BRAIN WITH AND WITHOUT;  Surgeon: Medication Radiologist, MD;  Location: [facility];  Service: Radiology;  Laterality: N/A;  . RADIOLOGY WITH ANESTHESIA N/A 03/02/2017   Procedure: MRI of BRAIN W and W/OUT CONTRAST;  Surgeon: Medication Radiologist, MD;  Location: [facility];  Service: Radiology;  Laterality: N/A;  . RADIOLOGY WITH ANESTHESIA N/A 07/29/2017   Procedure: RADIOLOGY WITH ANESTHESIA MRI OF BRAIN WITH AND WITHOUT CONTRAST;  Surgeon: Radiologist, Medication, MD;  Location: [facility];  Service: Radiology;  Laterality: N/A;  . RADIOLOGY WITH ANESTHESIA N/A 12/07/2017   Procedure: MRI WITH ANESTHESIA OF BRAIN WITH AND WITHOUT CONTRAST;  Surgeon: Radiologist, Medication, MD;  Location: [place];  Service: Radiology;  Laterality: N/A;  . RADIOLOGY WITH ANESTHESIA N/A 04/07/2018   Procedure: MRI OF BRAIN WITH AND WITHOUT CONTRAST;  Surgeon: Radiologist, Medication, MD;  Location: [place];  Service: Radiology;  Laterality: N/A;  . RADIOLOGY WITH ANESTHESIA N/A 08/23/2018   Procedure: MRI WITH ANESTHESIA OF THE BRAIN WITH AND WITHOUT;  Surgeon: Radiologist, Medication, MD;  Location: [facility];  Service: Radiology;  Laterality: N/A;  . right power port placement Right       Current Outpatient Medications:  .  anastrozole (ARIMIDEX) 1 MG tablet, TAKE 1 TABLET BY MOUTH EVERY DAY, Disp: 90 tablet, Rfl: 1 .  Biotin 1 MG CAPS, biotin, Disp: , Rfl:  .  Biotin 1000 MCG tablet, Take 1,000 mcg by mouth daily., Disp: , Rfl:  .  Cholecalciferol (VITAMIN D3) 5000 units CAPS, Take 5,000 Units by mouth daily., Disp: , Rfl:  .  cyclobenzaprine (FLEXERIL) 10 MG tablet, Take 1 tablet (10 mg total) by mouth daily., Disp: 30 tablet, Rfl: 0 .  Cyclobenzaprine HCl (CYCLOBENZAPRINE 20 TD), cyclobenzaprine, Disp: , Rfl:  .  docusate sodium (COLACE) 100 MG capsule, Take 100 mg by mouth daily as needed for mild constipation. , Disp: , Rfl:  .  fluticasone (FLONASE) 50 MCG/ACT nasal spray, SPRAY  2 SPRAYS INTO EACH NOSTRIL EVERY DAY, Disp: 16 g, Rfl: 2 .  gabapentin (NEURONTIN) 300 MG capsule, Take 1 capsule (300 mg total) by mouth 2 (two) times daily. (Patient taking differently: Take 300-600 mg by mouth See admin instructions. Take 300 mg by mouth in the morning and take 600 mg by mouth at bedtime), Disp: 60 capsule, Rfl: 3 .  ibuprofen (ADVIL,MOTRIN) 800 MG tablet, TAKE 1 TABLET BY MOUTH THREE TIMES A DAY, Disp: 90 tablet, Rfl: 0 .  lamoTRIgine (LAMICTAL) 25 MG tablet, Take 1 tablet (25 mg total) by mouth 2 (two) times daily. (Patient taking differently: Take 75 mg by mouth daily. ), Disp: 60 tablet, Rfl: 0 .  loratadine (CLARITIN) 10 MG tablet, TAKE 1 TABLET BY MOUTH EVERY  DAY (Patient taking differently: Take 10 mg by mouth daily. ), Disp: 30 tablet, Rfl: 11 .  lurasidone (LATUDA) 40 MG TABS tablet, Take 1 tablet (40 mg total) by mouth daily with breakfast., Disp: 30 tablet, Rfl: 0 .  metroNIDAZOLE (METROGEL VAGINAL) 0.75 % vaginal gel, Place 1 Applicatorful vaginally at bedtime., Disp: 70 g, Rfl: 0 .  ondansetron (ZOFRAN) 8 MG tablet, TAKE 1 TABLET (8 MG TOTAL) BY MOUTH EVERY 8 (EIGHT) HOURS AS NEEDED FOR NAUSEA OR VOMITING, Disp: 90 tablet, Rfl: 0 .  pantoprazole (PROTONIX) 40 MG tablet, Take 1 tablet (40 mg total) by mouth daily., Disp: 30 tablet, Rfl: 2 .  polyethylene glycol (MIRALAX / GLYCOLAX) packet, TAKE 17 G BY MOUTH DAILY AS NEEDED FOR MILD CONSTIPATION., Disp: 30 packet, Rfl: 0 .  traZODone (DESYREL) 100 MG tablet, Take 1 tablet (100 mg total) by mouth at bedtime. (Patient taking differently: Take 200 mg by mouth at bedtime. ), Disp: 30 tablet, Rfl: 0 .  varenicline (CHANTIX) 0.5 MG tablet, Take 0.5 mg by mouth daily., Disp: , Rfl:    Objective:   Vitals:   12/09/18 0940  BP: 123/74  Pulse: 100  Resp: 14  Temp: 98 F (36.7 C)  SpO2: 99%    Physical Exam Vitals signs and nursing note reviewed.  Constitutional:      Appearance: Normal appearance.  HENT:     Head:  Normocephalic and atraumatic.     Nose: Nose normal.     Mouth/Throat:     Mouth: Mucous membranes are moist.  Eyes:     Extraocular Movements: Extraocular movements intact.  Neck:     Musculoskeletal: Normal range of motion.  Cardiovascular:     Rate and Rhythm: Normal rate and regular rhythm.     Pulses: Normal pulses.  Pulmonary:     Effort: Pulmonary effort is normal.  Abdominal:     General: Abdomen is flat. There is no distension.     Palpations: There is no mass.  Musculoskeletal:        General: Swelling present.  Skin:    General: Skin is warm.  Neurological:     Mental Status: She is alert.  Psychiatric:        Mood and Affect: Mood normal.        Thought Content: Thought content normal.        Judgment: Judgment normal.     Assessment & Plan:  Brain metastasis ([facility])  Metastatic breast cancer ([place])  Bipolar I disorder, most recent episode depressed ([place])  Primary cancer of lower-inner quadrant of left female breast ([place])  S/P mastectomy, left  S/P breast reconstruction, left  S/P radiation therapy  Assessment and Plan:  A long, detailed conversation was had regarding the patient's options for breast reconstruction. The following was discussed.  1. Further reconstruction is a optional but she wants it for the discomfort of the left breast and asymmetry.  2. Reconstruction can be a multi-stage process which involves multiple surgeries spaced several months apart. The entire process can take over one year.  3. The major goal of breast reconstruction is to have the patient look normal in clothing. When naked, there will always be scars.  4. Asymmetries are often present during the reconstruction process. Several operations may be needed, including surgery to the non-cancerous breast, to achieve satisfactory results.  5. No matter the reconstructive method, there are ways that the reconstruction can fail and a secondary reconstructive plan would need to be  created.   A general discussion regarding all available methods of breast reconstruction were discussed. The types of reconstructions described included.  1. Tissue expander and implant based reconstruction, both single and multi-stage approaches.  2. Autologous only reconstructions, including free abdominal-tissue based reconstructions.  3. Combination procedures, particularly latissismus dorsi flaps combined with either expanders or implants.  For each of the reconstruction methods mentioned above, the risks, benefits, alternatives, scarring, and recovery time were discussed in great detail. Specific risks detailed included bleeding, infection, hematoma, seroma, scarring, pain, wound healing complications, flap loss, fat necrosis, capsular contracture, need for implant removal, donor site complications, bulge, hernia, umbilical necrosis, need for urgent reoperation, and need for dressing changes were discussed.   Assessment  Once all reconstruction options were presented, a focused discussion was had regarding the patient's suitability for each of these procedures.  A total of 50 minutes of face-to-face time was spent in this encounter, of which >50% was spent in counseling.  The patient would like to left-sided breast reconstruction and understands that due to the radiation she will need autologous reconstruction at least in part.  She is agreeable to a left latissimus muscle flap.  She was given the option of reconstruction with or without an expander.  Due to the fact that she would like to go a little bit larger we will plan for placement of the expander.  At the time of the exchange to the implant (3 months later) we can do a right breast lift for symmetry. She is scheduled for the CT scan January 30 and the MRI February 25.  We will plan to do the surgery in March.    [place], DO

## 2018-12-15 ENCOUNTER — Ambulatory Visit: Payer: Medicaid Other | Admitting: Podiatry

## 2018-12-15 ENCOUNTER — Other Ambulatory Visit: Payer: Self-pay | Admitting: Oncology

## 2018-12-25 ENCOUNTER — Emergency Department (HOSPITAL_COMMUNITY): Payer: Medicaid Other

## 2018-12-25 ENCOUNTER — Encounter (HOSPITAL_COMMUNITY): Payer: Self-pay

## 2018-12-25 ENCOUNTER — Emergency Department (HOSPITAL_COMMUNITY)
Admission: EM | Admit: 2018-12-25 | Discharge: 2018-12-25 | Disposition: A | Discharge status: Home / Self Care | Payer: Medicaid Other | Attending: Emergency Medicine | Admitting: Emergency Medicine

## 2018-12-25 ENCOUNTER — Other Ambulatory Visit: Payer: Self-pay

## 2018-12-25 DIAGNOSIS — Y92410 Unspecified street and highway as the place of occurrence of the external cause: Secondary | ICD-10-CM | POA: Insufficient documentation

## 2018-12-25 DIAGNOSIS — F419 Anxiety disorder, unspecified: Secondary | ICD-10-CM | POA: Diagnosis not present

## 2018-12-25 DIAGNOSIS — Z853 Personal history of malignant neoplasm of breast: Secondary | ICD-10-CM | POA: Diagnosis not present

## 2018-12-25 DIAGNOSIS — Z85841 Personal history of malignant neoplasm of brain: Secondary | ICD-10-CM | POA: Insufficient documentation

## 2018-12-25 DIAGNOSIS — F319 Bipolar disorder, unspecified: Secondary | ICD-10-CM | POA: Insufficient documentation

## 2018-12-25 DIAGNOSIS — Z791 Long term (current) use of non-steroidal anti-inflammatories (NSAID): Secondary | ICD-10-CM | POA: Diagnosis not present

## 2018-12-25 DIAGNOSIS — Z85830 Personal history of malignant neoplasm of bone: Secondary | ICD-10-CM | POA: Insufficient documentation

## 2018-12-25 DIAGNOSIS — F17210 Nicotine dependence, cigarettes, uncomplicated: Secondary | ICD-10-CM | POA: Diagnosis not present

## 2018-12-25 DIAGNOSIS — Z79899 Other long term (current) drug therapy: Secondary | ICD-10-CM | POA: Insufficient documentation

## 2018-12-25 DIAGNOSIS — R52 Pain, unspecified: Secondary | ICD-10-CM

## 2018-12-25 DIAGNOSIS — Y9389 Activity, other specified: Secondary | ICD-10-CM | POA: Insufficient documentation

## 2018-12-25 DIAGNOSIS — F1010 Alcohol abuse, uncomplicated: Secondary | ICD-10-CM | POA: Diagnosis not present

## 2018-12-25 DIAGNOSIS — Y998 Other external cause status: Secondary | ICD-10-CM | POA: Insufficient documentation

## 2018-12-25 DIAGNOSIS — S60222A Contusion of left hand, initial encounter: Secondary | ICD-10-CM | POA: Diagnosis not present

## 2018-12-25 DIAGNOSIS — S6992XA Unspecified injury of left wrist, hand and finger(s), initial encounter: Secondary | ICD-10-CM | POA: Diagnosis present

## 2018-12-25 MED ORDER — HYDROCODONE-ACETAMINOPHEN 5-325 MG PO TABS
1.0000 | ORAL_TABLET | Freq: Once | ORAL | Status: AC
  Administered 2018-12-25: 1 via ORAL
  Filled 2018-12-25: qty 1

## 2018-12-25 MED ORDER — METHOCARBAMOL 500 MG PO TABS: 500.0000 mg | ORAL_TABLET | Freq: Two times a day (BID) | ORAL | 0 refills | Status: DC

## 2018-12-25 MED ORDER — BACITRACIN ZINC 500 UNIT/GM EX OINT
TOPICAL_OINTMENT | Freq: Once | CUTANEOUS | Status: AC
  Administered 2018-12-25: 22:00:00 via TOPICAL
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
## 2018-12-26 ENCOUNTER — Telehealth: Payer: Self-pay | Admitting: Internal Medicine

## 2018-12-26 NOTE — Telephone Encounter (Signed)
Dr. Mickeal Skinner pal, 02/27 to 02/26. Left message for patient. Mailed out new schedule

## 2018-12-29 ENCOUNTER — Other Ambulatory Visit: Payer: Self-pay | Admitting: Adult Health

## 2018-12-29 DIAGNOSIS — C50919 Malignant neoplasm of unspecified site of unspecified female breast: Secondary | ICD-10-CM

## 2018-12-30 ENCOUNTER — Ambulatory Visit (HOSPITAL_COMMUNITY)
Admission: RE | Admit: 2018-12-30 | Discharge: 2018-12-30 | Disposition: A | Discharge status: Home / Self Care | Payer: Medicaid Other | Source: Ambulatory Visit | Attending: Adult Health | Admitting: Adult Health

## 2018-12-30 DIAGNOSIS — C50919 Malignant neoplasm of unspecified site of unspecified female breast: Secondary | ICD-10-CM | POA: Insufficient documentation

## 2018-12-30 DIAGNOSIS — Z1239 Encounter for other screening for malignant neoplasm of breast: Secondary | ICD-10-CM

## 2018-12-30 MED ORDER — IOHEXOL 300 MG/ML  SOLN
75.0000 mL | Freq: Once | INTRAMUSCULAR | Status: AC | PRN
  Administered 2018-12-30: 75 mL via INTRAVENOUS

## 2018-12-30 MED ORDER — SODIUM CHLORIDE (PF) 0.9 % IJ SOLN
INTRAMUSCULAR | Status: AC
  Filled 2018-12-30: qty 50

## 2019-01-02 ENCOUNTER — Ambulatory Visit
Admission: RE | Admit: 2019-01-02 | Discharge: 2019-01-02 | Disposition: A | Discharge status: Home / Self Care | Payer: Medicaid Other | Source: Ambulatory Visit | Attending: Adult Health | Admitting: Adult Health

## 2019-01-02 ENCOUNTER — Other Ambulatory Visit: Payer: Self-pay

## 2019-01-02 ENCOUNTER — Ambulatory Visit: Payer: Self-pay

## 2019-01-02 ENCOUNTER — Telehealth: Payer: Self-pay

## 2019-01-02 DIAGNOSIS — C50919 Malignant neoplasm of unspecified site of unspecified female breast: Secondary | ICD-10-CM

## 2019-01-02 DIAGNOSIS — C7951 Secondary malignant neoplasm of bone: Secondary | ICD-10-CM

## 2019-01-02 DIAGNOSIS — C7931 Secondary malignant neoplasm of brain: Secondary | ICD-10-CM

## 2019-01-02 NOTE — Progress Notes (Signed)
Per Dr.Magrinat review of pt CT scan, Pt to get PET scan for further evaluation of Right paratracheal adenopathy. Pt aware and will be seeing Dr.Magrinat for an appt tomorrow 01/03/2019.

## 2019-01-02 NOTE — Progress Notes (Signed)
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 uncontrolled diarrhea  (d) echocardiogram 11/11/2016 showed an ejection fraction in the 60-65%  (e) echocardiogram 03/03/2017 shows an ejection fraction of 60-65%  (f) echocardiogram on 05/19/2017 shows an ejection fraction of 55-60%  (g) echocardiogram 09/24/2017 shows the ejection fraction in the 60-65%  (h) echocardiogram 02/14/2018 shows an ejection fraction in the 60-65%  (I) echocardiogram  06/30/2018 shows an ejection fraction in the 55-60%    (7) started denosumab/Xgeva October 2017 given every 4 weeks, transitioned to every 8 weeks beginning 10/11/18  (8) started anastrozole October 2017   (a) bone scan 11/10/2016 shows no active disease  (b) chest CT scan 11/10/2016 stable, with no evidence of active disease  (c) chest CT and bone scan 07/02/2017 show no evidence of active disease  (d) CT scan of the chest with contrast 11/10/2017  shows some left axillary edema, but no evidence of thrombosis or adenopathy in that area, 0.9 cm precarinal lymph and 0.7 cm right upper lobe nodule node; bone lesions stable  (e) CT of the chest 05/04/2018 shows a 1.4 cm right lower paratracheal node which is slightly increased and a new right prevascular mediastinal  node measuring 0.7 cm.  Bone lesions are stable.  (f) chest CT on 07/01/2018 shows no definite findings of metastatic disease in the thorax. 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  (a) brain irradiation10/19/17-11/08/17: Whole brain/ 35 Gy in 14 fractions   (b) repeat brain MRI obtained 12/10/2016 shows no active disease in the brain  (c) repeat brain MRI 03/02/2017 shows no evidence of residual or recurrent disease  (d) repeat brain MRI 07/29/2017 shows no evidence of residual or recurrent disease  (e) repeat brain MRI 12/07/2017 shows no evidence of disease recurrence.  There is progressive white matter change secondary to prior treatment.  (f) repeat brain MRI 04/07/2018 showed no evidence of disease\  (g) repeat brain MRI on 08/23/2018 shows no evidence of disease   PLAN: Deitra is appropriately concerned about the changes noted on her recent CT scan of the chest.  I did emphasize that these lymph nodes were there previously, and that they have changed minimally, 2 or 3 mm.  In addition she does have significant allergies and that may be what we are seeing.  Nevertheless we cannot ignore this and for further evaluation I am setting her up for a PET scan which I hope will be done within the next week.  She is already scheduled for a brain MRI in 2 weeks.  She will see Dr. Mickeal Skinner on 01/25/2019.  I have scheduled [place] to see is the same day, a little earlier, so that she does not have to come twice.  Otherwise she will  receive trastuzumab today and continue to receive it every 28 days, with Xgeva as before  She knows to call for any other issues that may develop before the next visit.   Braylin Formby, Virgie Dad, MD  01/03/19 1:50 PM Medical Oncology and Hematology [facility] [address] [place], [place] [address] Tel. [phone]    Fax. [phone]  I, Jacqualyn Posey am acting as a Education administrator for Chauncey Cruel, MD.   I, Lurline Del MD, have reviewed the above documentation for accuracy and completeness, and I agree with the above.

## 2019-01-02 NOTE — Telephone Encounter (Signed)
Pt called to get results of her scans. Pt feels very anxious to know her results. Will  Let Dr.Magrinat know about results being available and call pt back today. Pt verbalized understanding.

## 2019-01-03 ENCOUNTER — Inpatient Hospital Stay (HOSPITAL_BASED_OUTPATIENT_CLINIC_OR_DEPARTMENT_OTHER): Payer: Medicaid Other | Admitting: Oncology

## 2019-01-03 ENCOUNTER — Inpatient Hospital Stay: Payer: Medicaid Other

## 2019-01-03 ENCOUNTER — Inpatient Hospital Stay: Payer: Medicaid Other | Attending: Medical

## 2019-01-03 ENCOUNTER — Telehealth: Payer: Self-pay | Admitting: Oncology

## 2019-01-03 VITALS — HR 96

## 2019-01-03 VITALS — BP 134/72 | HR 101 | Temp 97.9°F | Resp 18 | Ht 65.0 in | Wt 169.4 lb

## 2019-01-03 DIAGNOSIS — C50312 Malignant neoplasm of lower-inner quadrant of left female breast: Secondary | ICD-10-CM

## 2019-01-03 DIAGNOSIS — R590 Localized enlarged lymph nodes: Secondary | ICD-10-CM | POA: Diagnosis not present

## 2019-01-03 DIAGNOSIS — C7931 Secondary malignant neoplasm of brain: Secondary | ICD-10-CM | POA: Insufficient documentation

## 2019-01-03 DIAGNOSIS — R5383 Other fatigue: Secondary | ICD-10-CM | POA: Diagnosis not present

## 2019-01-03 DIAGNOSIS — C7951 Secondary malignant neoplasm of bone: Secondary | ICD-10-CM | POA: Diagnosis not present

## 2019-01-03 DIAGNOSIS — F17210 Nicotine dependence, cigarettes, uncomplicated: Secondary | ICD-10-CM

## 2019-01-03 DIAGNOSIS — Z79811 Long term (current) use of aromatase inhibitors: Secondary | ICD-10-CM

## 2019-01-03 DIAGNOSIS — Z5112 Encounter for antineoplastic immunotherapy: Secondary | ICD-10-CM | POA: Insufficient documentation

## 2019-01-03 DIAGNOSIS — F319 Bipolar disorder, unspecified: Secondary | ICD-10-CM | POA: Diagnosis not present

## 2019-01-03 DIAGNOSIS — Z9012 Acquired absence of left breast and nipple: Secondary | ICD-10-CM

## 2019-01-03 DIAGNOSIS — Z170 Estrogen receptor positive status [ER+]: Secondary | ICD-10-CM

## 2019-01-03 DIAGNOSIS — C50919 Malignant neoplasm of unspecified site of unspecified female breast: Secondary | ICD-10-CM

## 2019-01-03 LAB — COMPREHENSIVE METABOLIC PANEL
ALT: 20 U/L (ref 0–44)
AST: 21 U/L (ref 15–41)
Albumin: 4 g/dL (ref 3.5–5.0)
Alkaline Phosphatase: 63 U/L (ref 38–126)
Anion gap: 9 (ref 5–15)
BUN: 10 mg/dL (ref 6–20)
CO2: 30 mmol/L (ref 22–32)
Calcium: 10.1 mg/dL (ref 8.9–10.3)
Chloride: 102 mmol/L (ref 98–111)
Creatinine, Ser: 1.11 mg/dL — ABNORMAL HIGH (ref 0.44–1.00)
GFR calc Af Amer: >60 mL/min (ref >60)
GFR calc non Af Amer: 55 mL/min — ABNORMAL LOW (ref >60)
Glucose, Bld: 106 mg/dL — ABNORMAL HIGH (ref 70–99)
Potassium: 4 mmol/L (ref 3.5–5.1)
Sodium: 141 mmol/L (ref 135–145)
Total Bilirubin: 0.4 mg/dL (ref 0.3–1.2)
Total Protein: 7.2 g/dL (ref 6.5–8.1)

## 2019-01-03 LAB — CBC WITH DIFFERENTIAL/PLATELET
Abs Immature Granulocytes: 0.04 10*3/uL (ref 0.00–0.07)
Basophils Absolute: 0 10*3/uL (ref 0.0–0.1)
Basophils Relative: 0 %
Eosinophils Absolute: 0 10*3/uL (ref 0.0–0.5)
Eosinophils Relative: 1 %
HCT: 38.3 % (ref 36.0–46.0)
Hemoglobin: 12.4 g/dL (ref 12.0–15.0)
Immature Granulocytes: 1 %
Lymphocytes Relative: 18 %
Lymphs Abs: 1 10*3/uL (ref 0.7–4.0)
MCH: 30.7 pg (ref 26.0–34.0)
MCHC: 32.4 g/dL (ref 30.0–36.0)
MCV: 94.8 fL (ref 80.0–100.0)
Monocytes Absolute: 0.3 10*3/uL (ref 0.1–1.0)
Monocytes Relative: 5 %
Neutro Abs: 4.2 10*3/uL (ref 1.7–7.7)
Neutrophils Relative %: 75 %
Platelets: 161 10*3/uL (ref 150–400)
RBC: 4.04 MIL/uL (ref 3.87–5.11)
RDW: 12.6 % (ref 11.5–15.5)
WBC: 5.6 10*3/uL (ref 4.0–10.5)
nRBC: 0 % (ref 0.0–0.2)

## 2019-01-03 MED ORDER — SODIUM CHLORIDE 0.9% FLUSH
10.0000 mL | INTRAVENOUS | Status: DC | PRN
  Administered 2019-01-03: 10 mL
  Filled 2019-01-03: qty 10

## 2019-01-03 MED ORDER — HEPARIN SOD (PORK) LOCK FLUSH 100 UNIT/ML IV SOLN
500.0000 [IU] | Freq: Once | INTRAVENOUS | Status: AC | PRN
  Administered 2019-01-03: 500 [IU]
  Filled 2019-01-03: qty 5

## 2019-01-03 MED ORDER — TRASTUZUMAB CHEMO 150 MG IV SOLR
450.0000 mg | Freq: Once | INTRAVENOUS | Status: AC
  Administered 2019-01-03: 450 mg via INTRAVENOUS
  Filled 2019-01-03: qty 21.43

## 2019-01-03 MED ORDER — SODIUM CHLORIDE 0.9 % IV SOLN
Freq: Once | INTRAVENOUS | Status: AC
  Administered 2019-01-03: 14:00:00 via INTRAVENOUS
  Filled 2019-01-03: qty 250

## 2019-01-03 NOTE — Patient Instructions (Signed)
Cancer Center Discharge Instructions for Patients Receiving Chemotherapy  Today you received the following chemotherapy agents: Trastuzumab (Herceptin)  To help prevent nausea and vomiting after your treatment, we encourage you to take your nausea medication as directed.    If you develop nausea and vomiting that is not controlled by your nausea medication, call the clinic.   BELOW ARE SYMPTOMS THAT SHOULD BE REPORTED IMMEDIATELY:  *FEVER GREATER THAN 100.5 F  *CHILLS WITH OR WITHOUT FEVER  NAUSEA AND VOMITING THAT IS NOT CONTROLLED WITH YOUR NAUSEA MEDICATION  *UNUSUAL SHORTNESS OF BREATH  *UNUSUAL BRUISING OR BLEEDING  TENDERNESS IN MOUTH AND THROAT WITH OR WITHOUT PRESENCE OF ULCERS  *URINARY PROBLEMS  *BOWEL PROBLEMS  UNUSUAL RASH Items with * indicate a potential emergency and should be followed up as soon as possible.  Feel free to call the clinic should you have any questions or concerns. The clinic phone number is [phone].  Please show the CHEMO ALERT CARD at check-in to the Emergency Department and triage nurse.    

## 2019-01-03 NOTE — Telephone Encounter (Signed)
Called regarding 2/26

## 2019-01-04 LAB — CANCER ANTIGEN 27.29: CA 27.29: 6.8 U/mL (ref 0.0–38.6)

## 2019-01-05 ENCOUNTER — Other Ambulatory Visit: Payer: Self-pay | Admitting: Oncology

## 2019-01-06 ENCOUNTER — Other Ambulatory Visit: Payer: Self-pay | Admitting: Oncology

## 2019-01-06 ENCOUNTER — Telehealth: Payer: Self-pay | Admitting: Oncology

## 2019-01-06 NOTE — Telephone Encounter (Signed)
Scheduled appt per 2/4 sch message - pt is aware of appts added

## 2019-01-09 ENCOUNTER — Telehealth: Payer: Self-pay | Admitting: Adult Health

## 2019-01-09 NOTE — Telephone Encounter (Signed)
Called [employer] and spoke with [place] for 5 minutes, and then Jocelyn P for 2 minutes.  They could not schedule peer to peer due to technical difficulties and asked that I call back later to be able to schedule.  They let me know that the deadline to schedule peer to peer is Wednesday, 01/11/2019.  Wilber Bihari, NP

## 2019-01-10 ENCOUNTER — Telehealth: Payer: Self-pay | Admitting: Adult Health

## 2019-01-10 NOTE — Telephone Encounter (Signed)
Called and spoke with [facility] And then Janett Billow at [facility].  Scheduled a peer to peer with Dr. Alanson Aly on 01/11/2019 at 215pm.    Time spent: 7 minutes.  Wilber Bihari, NP

## 2019-01-11 ENCOUNTER — Telehealth: Payer: Self-pay

## 2019-01-11 ENCOUNTER — Telehealth: Payer: Self-pay | Admitting: Adult Health

## 2019-01-11 NOTE — Telephone Encounter (Signed)
Nurse returned patient's call.  VM left for return call.

## 2019-01-11 NOTE — Telephone Encounter (Signed)
Received phone call with Dr. Arleta Creek today at 1415 regarding patient.  Reviewed relevant clinical information.  Received approval for PET scan with approval number [id number].    Wilber Bihari, NP

## 2019-01-17 ENCOUNTER — Encounter: Payer: Self-pay | Admitting: Critical Care Medicine

## 2019-01-17 ENCOUNTER — Telehealth: Payer: Self-pay | Admitting: *Deleted

## 2019-01-17 ENCOUNTER — Ambulatory Visit: Payer: Medicaid Other | Attending: Critical Care Medicine | Admitting: Critical Care Medicine

## 2019-01-17 VITALS — BP 122/82 | HR 102 | Temp 97.7°F | Ht 65.0 in | Wt 167.0 lb

## 2019-01-17 DIAGNOSIS — Z79811 Long term (current) use of aromatase inhibitors: Secondary | ICD-10-CM | POA: Diagnosis not present

## 2019-01-17 DIAGNOSIS — K219 Gastro-esophageal reflux disease without esophagitis: Secondary | ICD-10-CM | POA: Diagnosis not present

## 2019-01-17 DIAGNOSIS — Z833 Family history of diabetes mellitus: Secondary | ICD-10-CM | POA: Diagnosis not present

## 2019-01-17 DIAGNOSIS — F319 Bipolar disorder, unspecified: Secondary | ICD-10-CM | POA: Diagnosis not present

## 2019-01-17 DIAGNOSIS — C50919 Malignant neoplasm of unspecified site of unspecified female breast: Secondary | ICD-10-CM

## 2019-01-17 DIAGNOSIS — F17200 Nicotine dependence, unspecified, uncomplicated: Secondary | ICD-10-CM

## 2019-01-17 DIAGNOSIS — Z85841 Personal history of malignant neoplasm of brain: Secondary | ICD-10-CM

## 2019-01-17 DIAGNOSIS — J302 Other seasonal allergic rhinitis: Secondary | ICD-10-CM

## 2019-01-17 DIAGNOSIS — Z853 Personal history of malignant neoplasm of breast: Secondary | ICD-10-CM

## 2019-01-17 DIAGNOSIS — Z79899 Other long term (current) drug therapy: Secondary | ICD-10-CM | POA: Diagnosis not present

## 2019-01-17 DIAGNOSIS — Z881 Allergy status to other antibiotic agents status: Secondary | ICD-10-CM | POA: Insufficient documentation

## 2019-01-17 DIAGNOSIS — R0789 Other chest pain: Secondary | ICD-10-CM

## 2019-01-17 DIAGNOSIS — Z885 Allergy status to narcotic agent status: Secondary | ICD-10-CM | POA: Insufficient documentation

## 2019-01-17 DIAGNOSIS — F17210 Nicotine dependence, cigarettes, uncomplicated: Secondary | ICD-10-CM | POA: Insufficient documentation

## 2019-01-17 DIAGNOSIS — C7931 Secondary malignant neoplasm of brain: Secondary | ICD-10-CM | POA: Diagnosis present

## 2019-01-17 DIAGNOSIS — J309 Allergic rhinitis, unspecified: Secondary | ICD-10-CM | POA: Insufficient documentation

## 2019-01-17 DIAGNOSIS — C787 Secondary malignant neoplasm of liver and intrahepatic bile duct: Secondary | ICD-10-CM | POA: Insufficient documentation

## 2019-01-17 DIAGNOSIS — Z818 Family history of other mental and behavioral disorders: Secondary | ICD-10-CM | POA: Diagnosis not present

## 2019-01-17 DIAGNOSIS — Z8589 Personal history of malignant neoplasm of other organs and systems: Secondary | ICD-10-CM

## 2019-01-17 DIAGNOSIS — C50312 Malignant neoplasm of lower-inner quadrant of left female breast: Secondary | ICD-10-CM | POA: Insufficient documentation

## 2019-01-17 MED ORDER — FLUTICASONE PROPIONATE 50 MCG/ACT NA SUSP: 2.0000 | Freq: Every day | NASAL | 4 refills | Status: DC

## 2019-01-17 MED ORDER — PANTOPRAZOLE SODIUM 40 MG PO TBEC: 40.0000 mg | DELAYED_RELEASE_TABLET | Freq: Every day | ORAL | 2 refills | Status: DC

## 2019-01-17 MED ORDER — NICOTINE POLACRILEX 4 MG MT LOZG: 4.0000 mg | LOZENGE | OROMUCOSAL | 0 refills | Status: DC | PRN

## 2019-01-17 MED ORDER — LORATADINE 10 MG PO TABS: 10.0000 mg | ORAL_TABLET | Freq: Every day | ORAL | 6 refills | Status: DC

## 2019-01-17 MED ORDER — DIAZEPAM 5 MG PO TABS: ORAL_TABLET | ORAL | 0 refills | Status: DC

## 2019-01-17 MED ORDER — CYCLOBENZAPRINE HCL 10 MG PO TABS: 10.0000 mg | ORAL_TABLET | Freq: Two times a day (BID) | ORAL | 3 refills | Status: DC

## 2019-01-17 MED FILL — NICOTINE 4 MG LOZENGE: 4 | 30 days supply | Qty: 100 | Fill #0
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
## 2019-01-17 NOTE — Assessment & Plan Note (Signed)
Ongoing allergic rhinitis We will continue Flonase and Claritin but no antibiotics are indicated as there is not an acute infection

## 2019-01-17 NOTE — Assessment & Plan Note (Signed)
Gastroesophageal reflux disease under good control  Continue Protonix and will provide refills

## 2019-01-17 NOTE — Patient Instructions (Signed)
Refills on your Protonix, Claritin, Flonase, and Flexeril were [facility]  The nicotine 4 mg lozenge was sent to our pharmacy to use 3 times daily dissolved this in the mouth for smoking cessation  We will follow-up the results of your PET scan and MRI of the brain  Continue use the Advil for chest wall pain   A tetanus vaccine was given today  Return to see Dr. Margarita Rana within the next 2 months

## 2019-01-17 NOTE — Assessment & Plan Note (Signed)
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
## 2019-01-17 NOTE — Telephone Encounter (Signed)
This RN spoke with pt per scheduled PET and pt stating concern for claustrophobia.  This RN explained to pt above scan is not an enclosed scan but a 2 foot wide tube - so she is never totally inside a tunnel like the MRI.  She was told " the scan takes 1 1/2 hours and I can't lay still that long "  This RN explained the actual scan of going thru the tube is 12 minutes but getting prepped for the scan takes longer.  Per need for pt to endure and have good outcome medication can be given to decrease her anxiety and help her relax.  Pt verified she has a driver.  Prescription for valium will be obtained with pt understanding use of medication.

## 2019-01-17 NOTE — Assessment & Plan Note (Signed)
Ongoing tobacco use I spent 10 minutes of this visit going over tobacco cessation and will utilize nicotine 4 mg lozenge 3 times daily in addition to the low-dose of Chantix

## 2019-01-19 ENCOUNTER — Ambulatory Visit (HOSPITAL_COMMUNITY)
Admission: RE | Admit: 2019-01-19 | Discharge: 2019-01-19 | Disposition: A | Discharge status: Home / Self Care | Payer: Medicaid Other | Source: Ambulatory Visit | Attending: Oncology | Admitting: Oncology

## 2019-01-19 ENCOUNTER — Other Ambulatory Visit: Payer: Self-pay | Admitting: Radiation Therapy

## 2019-01-19 DIAGNOSIS — C50919 Malignant neoplasm of unspecified site of unspecified female breast: Secondary | ICD-10-CM | POA: Insufficient documentation

## 2019-01-19 LAB — GLUCOSE, CAPILLARY: Glucose-Capillary: 103 mg/dL — ABNORMAL HIGH (ref 70–99)

## 2019-01-19 MED ORDER — FLUDEOXYGLUCOSE F - 18 (FDG) INJECTION: 8.1000 | Freq: Once | INTRAVENOUS | Status: DC | PRN

## 2019-01-20 ENCOUNTER — Other Ambulatory Visit: Payer: Self-pay | Admitting: Oncology

## 2019-01-20 ENCOUNTER — Ambulatory Visit: Payer: Self-pay | Admitting: Plastic Surgery

## 2019-01-20 ENCOUNTER — Encounter (HOSPITAL_COMMUNITY): Payer: Self-pay | Admitting: *Deleted

## 2019-01-20 NOTE — Progress Notes (Addendum)
Ms [name] denies chest painor shortness of breath.  Patient was frustrated with the call, "You have everything in the computer." I explained that we review history and medications for her safety. Ms [name] called back to clarify time of arrival- I told her 5:45, patient then said it has always been 2 hours early and she probably will not be here until after 6.

## 2019-01-20 NOTE — Progress Notes (Signed)
[name]'s PET scan shows evidence of progression.  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

## 2019-01-23 NOTE — Anesthesia Preprocedure Evaluation (Addendum)
Anesthesia Evaluation  Patient identified by MRN, date of birth, ID band Patient awake    Reviewed: Allergy & Precautions, NPO status , Patient's Chart, lab work & pertinent test results  History of Anesthesia Complications (+) PONV and history of anesthetic complications  Airway Mallampati: II  TM Distance: >3 FB Neck ROM: Full    Dental no notable dental hx. (+) Teeth Intact, Dental Advisory Given   Pulmonary Current Smoker,    Pulmonary exam normal breath sounds clear to auscultation       Cardiovascular negative cardio ROS Normal cardiovascular exam Rhythm:Regular Rate:Normal     Neuro/Psych  Headaches, PSYCHIATRIC DISORDERS Anxiety Depression Bipolar Disorder  Neuromuscular disease    GI/Hepatic GERD  ,(+)     substance abuse  alcohol use,   Endo/Other  negative endocrine ROS  Renal/GU negative Renal ROS     Musculoskeletal  (+) Arthritis , Fibromyalgia -, narcotic dependent  Abdominal   Peds  Hematology  (+) anemia ,   Anesthesia Other Findings   Reproductive/Obstetrics  Metastatic breast cancer                            Anesthesia Physical Anesthesia Plan  ASA: III  Anesthesia Plan: General   Post-op Pain Management:    Induction: Intravenous  PONV Risk Score and Plan: 3 and Treatment may vary due to age or medical condition, Ondansetron, Dexamethasone and Midazolam  Airway Management Planned: Oral ETT  Additional Equipment: None  Intra-op Plan:   Post-operative Plan: Extubation in OR  Informed Consent:   Plan Discussed with: CRNA and Anesthesiologist  Anesthesia Plan Comments:        Anesthesia Quick Evaluation

## 2019-01-24 ENCOUNTER — Ambulatory Visit (HOSPITAL_COMMUNITY): Payer: Medicaid Other | Admitting: Anesthesiology

## 2019-01-24 ENCOUNTER — Encounter (HOSPITAL_COMMUNITY)
Admission: RE | Disposition: A | Discharge status: Home / Self Care | Payer: Self-pay | Source: Home / Self Care | Attending: Internal Medicine

## 2019-01-24 ENCOUNTER — Other Ambulatory Visit: Payer: Self-pay

## 2019-01-24 ENCOUNTER — Encounter (HOSPITAL_COMMUNITY): Payer: Self-pay | Admitting: General Practice

## 2019-01-24 ENCOUNTER — Ambulatory Visit (HOSPITAL_COMMUNITY)
Admission: RE | Admit: 2019-01-24 | Discharge: 2019-01-24 | Disposition: A | Discharge status: Home / Self Care | Payer: Medicaid Other | Source: Ambulatory Visit | Attending: Internal Medicine | Admitting: Internal Medicine

## 2019-01-24 ENCOUNTER — Telehealth: Payer: Self-pay | Admitting: *Deleted

## 2019-01-24 ENCOUNTER — Ambulatory Visit (HOSPITAL_COMMUNITY)
Admission: RE | Admit: 2019-01-24 | Discharge: 2019-01-24 | Disposition: A | Discharge status: Home / Self Care | Payer: Medicaid Other | Attending: Internal Medicine | Admitting: Internal Medicine

## 2019-01-24 DIAGNOSIS — Z7951 Long term (current) use of inhaled steroids: Secondary | ICD-10-CM | POA: Insufficient documentation

## 2019-01-24 DIAGNOSIS — F17210 Nicotine dependence, cigarettes, uncomplicated: Secondary | ICD-10-CM | POA: Diagnosis not present

## 2019-01-24 DIAGNOSIS — Z79811 Long term (current) use of aromatase inhibitors: Secondary | ICD-10-CM | POA: Diagnosis not present

## 2019-01-24 DIAGNOSIS — C50312 Malignant neoplasm of lower-inner quadrant of left female breast: Secondary | ICD-10-CM | POA: Diagnosis present

## 2019-01-24 DIAGNOSIS — C7931 Secondary malignant neoplasm of brain: Secondary | ICD-10-CM

## 2019-01-24 DIAGNOSIS — Z79899 Other long term (current) drug therapy: Secondary | ICD-10-CM | POA: Diagnosis not present

## 2019-01-24 DIAGNOSIS — Z85841 Personal history of malignant neoplasm of brain: Secondary | ICD-10-CM | POA: Diagnosis not present

## 2019-01-24 DIAGNOSIS — Z9012 Acquired absence of left breast and nipple: Secondary | ICD-10-CM | POA: Diagnosis not present

## 2019-01-24 DIAGNOSIS — R0789 Other chest pain: Secondary | ICD-10-CM | POA: Diagnosis not present

## 2019-01-24 DIAGNOSIS — F319 Bipolar disorder, unspecified: Secondary | ICD-10-CM | POA: Insufficient documentation

## 2019-01-24 DIAGNOSIS — Z923 Personal history of irradiation: Secondary | ICD-10-CM | POA: Diagnosis not present

## 2019-01-24 DIAGNOSIS — K219 Gastro-esophageal reflux disease without esophagitis: Secondary | ICD-10-CM | POA: Insufficient documentation

## 2019-01-24 DIAGNOSIS — J302 Other seasonal allergic rhinitis: Secondary | ICD-10-CM | POA: Insufficient documentation

## 2019-01-24 DIAGNOSIS — Z8673 Personal history of transient ischemic attack (TIA), and cerebral infarction without residual deficits: Secondary | ICD-10-CM | POA: Diagnosis not present

## 2019-01-24 HISTORY — PX: RADIOLOGY WITH ANESTHESIA: SHX6223

## 2019-01-24 SURGERY — MRI WITH ANESTHESIA
Anesthesia: General

## 2019-01-24 MED ORDER — PROMETHAZINE HCL 25 MG/ML IJ SOLN
INTRAMUSCULAR | Status: AC
  Filled 2019-01-24: qty 1

## 2019-01-24 MED ORDER — SODIUM CHLORIDE 0.9 % IV SOLN
8.0000 mg | Freq: Once | INTRAVENOUS | Status: DC
  Filled 2019-01-24: qty 4

## 2019-01-24 MED ORDER — ONDANSETRON HCL 4 MG/2ML IJ SOLN
INTRAMUSCULAR | Status: AC
  Administered 2019-01-24: 8 mg via INTRAVENOUS
  Filled 2019-01-24: qty 4

## 2019-01-24 MED ORDER — GADOBUTROL 1 MMOL/ML IV SOLN
7.5000 mL | Freq: Once | INTRAVENOUS | Status: AC | PRN
  Administered 2019-01-24: 7.5 mL via INTRAVENOUS

## 2019-01-24 MED ORDER — LACTATED RINGERS IV SOLN
INTRAVENOUS | Status: DC
  Administered 2019-01-24: 07:00:00 via INTRAVENOUS

## 2019-01-24 MED ORDER — ONDANSETRON HCL 4 MG/2ML IJ SOLN
8.0000 mg | Freq: Once | INTRAMUSCULAR | Status: AC
  Administered 2019-01-24: 8 mg via INTRAVENOUS

## 2019-01-24 MED ORDER — PROMETHAZINE HCL 25 MG/ML IJ SOLN: 6.2500 mg | INTRAMUSCULAR | Status: DC | PRN

## 2019-01-24 NOTE — Telephone Encounter (Signed)
VM left by pt requesting results from MRI she had done this AM.  Noted MRI was ordered by Dr Mickeal Skinner - this note will be sent to his nurse for pt communication and appropriate follow up.  Pt left number for contact as [phone].

## 2019-01-24 NOTE — Progress Notes (Signed)
Patient complaining of nausea and stated she had one episode of vomiting this morning because she forgot to take her Zofran with her medications this morning.  Received verbal order to give 8 mg of Zofran.

## 2019-01-24 NOTE — Anesthesia Postprocedure Evaluation (Signed)
Anesthesia Post Note  Patient: Trena Dunavan  Procedure(s) Performed: MRI OF BRAIN WITH AND WITHOUT CONTRAST (N/A )     Patient location during evaluation: PACU Anesthesia Type: General Level of consciousness: awake and alert Pain management: pain level controlled Vital Signs Assessment: post-procedure vital signs reviewed and stable Respiratory status: spontaneous breathing, nonlabored ventilation and respiratory function stable Cardiovascular status: blood pressure returned to baseline and stable Postop Assessment: no apparent nausea or vomiting Anesthetic complications: no    Last Vitals:  Vitals:   01/24/19 1015 01/24/19 1035  BP: 140/60 (!) 114/98  Pulse:    Resp:  16  Temp:    SpO2: 94% 99%    Last Pain:  Vitals:   01/24/19 1035  TempSrc:   PainSc: 0-No pain                 Audry Pili

## 2019-01-24 NOTE — Transfer of Care (Signed)
Immediate Anesthesia Transfer of Care Note  Patient: [name]  Procedure(s) Performed: MRI OF BRAIN WITH AND WITHOUT CONTRAST (N/A )  Patient Location: PACU  Anesthesia Type:General  Level of Consciousness: awake, alert  and oriented  Airway & Oxygen Therapy: Patient Spontanous Breathing and Patient connected to face mask oxygen  Post-op Assessment: Report given to RN and Post -op Vital signs reviewed and stable  Post vital signs: Reviewed and stable  Last Vitals:  Vitals Value Taken Time  BP 121/68 01/24/2019 10:05 AM  Temp    Pulse 95 01/24/2019 10:06 AM  Resp 17 01/24/2019 10:06 AM  SpO2 94 % 01/24/2019 10:06 AM  Vitals shown include unvalidated device data.  Last Pain:  Vitals:   01/24/19 0708  TempSrc:   PainSc: 0-No pain      Patients Stated Pain Goal: 4 (17/00/17 4944)  Complications: No apparent anesthesia complications

## 2019-01-25 ENCOUNTER — Inpatient Hospital Stay: Payer: Medicaid Other

## 2019-01-25 ENCOUNTER — Inpatient Hospital Stay (HOSPITAL_BASED_OUTPATIENT_CLINIC_OR_DEPARTMENT_OTHER): Payer: Medicaid Other | Admitting: Adult Health

## 2019-01-25 ENCOUNTER — Encounter (HOSPITAL_COMMUNITY): Payer: Self-pay | Admitting: Radiology

## 2019-01-25 ENCOUNTER — Telehealth: Payer: Self-pay | Admitting: Adult Health

## 2019-01-25 ENCOUNTER — Inpatient Hospital Stay: Payer: Medicaid Other | Admitting: Internal Medicine

## 2019-01-25 VITALS — BP 133/77 | HR 92 | Temp 98.0°F | Resp 18 | Ht 65.0 in | Wt 169.0 lb

## 2019-01-25 DIAGNOSIS — C7931 Secondary malignant neoplasm of brain: Secondary | ICD-10-CM | POA: Diagnosis not present

## 2019-01-25 DIAGNOSIS — C7951 Secondary malignant neoplasm of bone: Secondary | ICD-10-CM | POA: Diagnosis not present

## 2019-01-25 DIAGNOSIS — C50312 Malignant neoplasm of lower-inner quadrant of left female breast: Secondary | ICD-10-CM | POA: Diagnosis not present

## 2019-01-25 DIAGNOSIS — R590 Localized enlarged lymph nodes: Secondary | ICD-10-CM

## 2019-01-25 DIAGNOSIS — C50919 Malignant neoplasm of unspecified site of unspecified female breast: Secondary | ICD-10-CM

## 2019-01-25 DIAGNOSIS — Z5112 Encounter for antineoplastic immunotherapy: Secondary | ICD-10-CM | POA: Diagnosis not present

## 2019-01-25 DIAGNOSIS — F319 Bipolar disorder, unspecified: Secondary | ICD-10-CM

## 2019-01-25 DIAGNOSIS — Z170 Estrogen receptor positive status [ER+]: Secondary | ICD-10-CM

## 2019-01-25 DIAGNOSIS — Z79811 Long term (current) use of aromatase inhibitors: Secondary | ICD-10-CM

## 2019-01-25 LAB — CBC WITH DIFFERENTIAL/PLATELET
Abs Immature Granulocytes: 0.03 10*3/uL (ref 0.00–0.07)
Basophils Absolute: 0 10*3/uL (ref 0.0–0.1)
Basophils Relative: 0 %
Eosinophils Absolute: 0 10*3/uL (ref 0.0–0.5)
Eosinophils Relative: 0 %
HCT: 37.2 % (ref 36.0–46.0)
Hemoglobin: 12.1 g/dL (ref 12.0–15.0)
Immature Granulocytes: 1 %
Lymphocytes Relative: 25 %
Lymphs Abs: 1.6 10*3/uL (ref 0.7–4.0)
MCH: 30.9 pg (ref 26.0–34.0)
MCHC: 32.5 g/dL (ref 30.0–36.0)
MCV: 94.9 fL (ref 80.0–100.0)
Monocytes Absolute: 0.5 10*3/uL (ref 0.1–1.0)
Monocytes Relative: 7 %
Neutro Abs: 4.2 10*3/uL (ref 1.7–7.7)
Neutrophils Relative %: 67 %
Platelets: 190 10*3/uL (ref 150–400)
RBC: 3.92 MIL/uL (ref 3.87–5.11)
RDW: 12.7 % (ref 11.5–15.5)
WBC: 6.4 10*3/uL (ref 4.0–10.5)
nRBC: 0 % (ref 0.0–0.2)

## 2019-01-25 LAB — COMPREHENSIVE METABOLIC PANEL
ALT: 12 U/L (ref 0–44)
AST: 16 U/L (ref 15–41)
Albumin: 4.1 g/dL (ref 3.5–5.0)
Alkaline Phosphatase: 57 U/L (ref 38–126)
Anion gap: 9 (ref 5–15)
BUN: 11 mg/dL (ref 6–20)
CO2: 30 mmol/L (ref 22–32)
Calcium: 10.7 mg/dL — ABNORMAL HIGH (ref 8.9–10.3)
Chloride: 101 mmol/L (ref 98–111)
Creatinine, Ser: 0.95 mg/dL (ref 0.44–1.00)
GFR calc Af Amer: >60 mL/min (ref >60)
GFR calc non Af Amer: >60 mL/min (ref >60)
Glucose, Bld: 95 mg/dL (ref 70–99)
Potassium: 3.5 mmol/L (ref 3.5–5.1)
Sodium: 140 mmol/L (ref 135–145)
Total Bilirubin: 0.2 mg/dL — ABNORMAL LOW (ref 0.3–1.2)
Total Protein: 7.1 g/dL (ref 6.5–8.1)

## 2019-01-25 NOTE — Telephone Encounter (Signed)
Scheduled appt per 2/26 sch message - pt is aware of apt date and time

## 2019-01-25 NOTE — Progress Notes (Addendum)
[name]  [phone]) [phone] Fax:(336) [phone]     ID: [name] DOB: 07/19/1961  MR#: [id number]  DHR#:[id number]  Patient Care Team: Charlott Rakes, MD as PCP - General (Family Medicine) Kyung Rudd, MD as Consulting Physician (Radiation Oncology) Erline Levine, MD as Consulting Physician (Neurosurgery) Corena Pilgrim, MD as Consulting Physician (Psychiatry) Mickeal Skinner Acey Lav, MD as Consulting Physician (Psychiatry) Nehemiah Settle, MD as Referring Physician (Plastic Surgery) Irene Limbo, MD as Consulting Physician (Plastic Surgery) Dillingham, Loel Lofty, DO as Attending Physician (Plastic Surgery)   CHIEF COMPLAINT: Metastatic triple positive breast cancer  CURRENT TREATMENT: Trastuzumab, denosumab, anastrozole   INTERVAL HISTORY: [name] returns today for follow-up and treatment of her metastatic triple positive breast cancer.   She continues on trastuzumab. She tolerates this well and without any noticeable side effects.    She also continues on denosumab. She tolerates this well and without any noticeable side effects.    Finally, she also continues on anastrozole. She has vaginal dryness.  She is aware of our pelvic health program but has not opted to participate in it.  [name]'s last echocardiogram on 12/08/2018, showed an ejection fraction in the 60% - 65% range.   She underwent a chest CT on 12/30/2018 showing: Precarinal and RIGHT paratracheal adenopathy mildly increased from prior concerning for breast cancer metastasis recurrence. Recommend FDG PET scan further evaluation. Stable sclerotic lesions in the thoracic spine consists with stable skeletal metastasis.  She underwent PET scan on 01/19/2019 that showed intensely hypermetabolic and enlarged right paratracheal lymph node, consistent with breast cancer recurrence, no addition sites of metastatic disease, several sclerotic lesions in the lumbar and thoracic spine without metabolic  activity suggesting treated skeletal metastases.    She underwent repeat brain MRI on 01/24/2019 that showed stable post treatment changes of brain and upper cervical spine, no focal enhancement or mass lesion to suggest residual recurrent metastases, remote infarct of the left cerebellum is stable.  [name] notes that she received a phone call from Dr. Mickeal Skinner about her results and does not have to f/u with him later today as scheduled.    REVIEW OF SYSTEMS: [name] is feeling well.  She wants to know what the plan is for her PET scan results.  She denies any new issues and continues on Anastrozole.  She is tolerating that well.  She denies any new pain, fevers, chills, nausea, vomiting, dysphagia, indigestion, bowel/bladder changes.  She hasn't noted chest pain, palpitations, cough, shortness of breath.  She has been anxious over the past couple of weeks waiting on the results from her scans.  She says this made her more "snappy" than she typically is.  Otherwise, a detailed ROS was non contributory.  BREAST CANCER HISTORY: From the original intake note:  [name] was aware of a "lemon sized lump in" her left axilla for about a year before bringing it to medical attention. By then she had developed left breast and left axillary swelling (June 2016). She presented to the local emergency room and had a chest CT scan 06/06/2015 which showed a nodule in the left breast measuring 0.9 cm and questionable left axillary adenopathy. She then proceeded to bilateral diagnostic mammography and left breast ultrasonography 06/19/2015. There were no prior films for comparison (last mammography 12 years prior).. The breast density was category C. Mammography showed in the left breast upper inner quadrant a 7 cm area including a small mass and significant pleomorphic calcifications. Ultrasonography defined the mass as measuring 1.2 cm. The left  axilla appeared unremarkable. There was significant skin edema.  Biopsy of the left  breast mass 06/19/2015 showed ([id number]) an invasive ductal carcinoma, grade 2, estrogen receptor 83% positive, progesterone receptor 26% positive, and HER-2 amplified by immunohistochemstry with a 3+ reading. The patient had biopsies of a separate area in the left breast August of the same year and this showed atypical ductal hyperplasia. (SP [id number]).  Accordingly after appropriate discussion on 08/21/2015 the patient proceeded to left mastectomy with left axillary sentinel lymph node sampling, which, since the lymph nodes were positive, extended to the procedure to left axillary lymph node dissection. The pathology ([id number]) showed an invasive ductal carcinoma, grade 3, measuring in excess of 9 cm. There were also skin satellites, not contiguous with the invasive carcinoma. Margins were clear and ample. There was evidence of lymphovascular invasion. A total of 15 lymph nodes were removed, including 5 sentinel lymph nodes, all of which were positive, so that the final total was 14 out of 15 lymph nodes involved by tumor. There was evidence of extranodal extension. The final pathology was pT4b pN2a, stage IIIB  CA-27-29 and CEA 09/19/2015 were non-informative October 2016.  Unfortunately CT scans of the chest abdomen and pelvis 09/16/2015 showed bony metastases to the right scapula, left iliac crest, and also L4 and T-spine. There were questionable liver cysts which on repeat CT scan 03/02/2016 appear to be a little bit more well-defined, possibly a little larger. There were also some possible right upper lobe lung lesions.  Adjuvant treatment consisted of docetaxel, trastuzumab and pertuzumab, with the final (6th) docetaxel dose given 02/11/2016. She continues on trastuzumab and pertuzumab, with the 11th cycle given 05/05/2016. Echocardiogram 02/26/2016 showed an ejection fraction of 55%. She receives denosumab/Xgeva every 4 weeks.. She also receives radiation, started 06/09/201, to be completed  06/26/2016.  Her subsequent history is as detailed below   PAST MEDICAL HISTORY: Past Medical History:  Diagnosis Date  . Alcohol abuse   . Anemia    during chemo  . Anxiety    At age 50  . Arthritis Dx 2010  . Bipolar disorder ([place])   . Bronchitis   . Cancer ([place])    breast mets to brain  . Chronic pain   . Complication of anesthesia   . Depression   . Family history of adverse reaction to anesthesia    MOther had PONV  . Fibromyalgia Dx 2005  . GERD (gastroesophageal reflux disease)   . Headache    hx  migraines  . Lymphedema of left arm   . Opiate dependence ([place])   . PONV (postoperative nausea and vomiting)   . Port-A-Cath in place   . PTSD (post-traumatic stress disorder)      PAST SURGICAL HISTORY: Past Surgical History:  Procedure Laterality Date  . APPLICATION OF CRANIAL NAVIGATION N/A 08/14/2016   Procedure: APPLICATION OF CRANIAL NAVIGATION;  Surgeon: Erline Levine, MD;  Location: [facility];  Service: Neurosurgery;  Laterality: N/A;  . BREAST RECONSTRUCTION Left    with silicone implant  . COLONOSCOPY W/ POLYPECTOMY    . CRANIOTOMY N/A 08/14/2016   Procedure: CRANIOTOMY TUMOR EXCISION WITH Lucky Rathke;  Surgeon: Erline Levine, MD;  Location: [facility];  Service: Neurosurgery;  Laterality: N/A;  . FIBULA FRACTURE SURGERY Left   . MASTECTOMY Left   . RADIOLOGY WITH ANESTHESIA N/A 07/23/2016   Procedure: MRI OF BRAIN WITH AND WITHOUT;  Surgeon: Medication Radiologist, MD;  Location: [facility];  Service: Radiology;  Laterality: N/A;  .  RADIOLOGY WITH ANESTHESIA N/A 09/08/2016   Procedure: MRI OF BRAIN WITH AND WITHOUT CONTRAST;  Surgeon: Medication Radiologist, MD;  Location: [facility];  Service: Radiology;  Laterality: N/A;  . RADIOLOGY WITH ANESTHESIA N/A 12/10/2016   Procedure: MRI OF BRAIN WITH AND WITHOUT;  Surgeon: Medication Radiologist, MD;  Location: [facility];  Service: Radiology;  Laterality: N/A;  . RADIOLOGY WITH ANESTHESIA N/A 03/02/2017   Procedure: MRI of  BRAIN W and W/OUT CONTRAST;  Surgeon: Medication Radiologist, MD;  Location: [facility];  Service: Radiology;  Laterality: N/A;  . RADIOLOGY WITH ANESTHESIA N/A 07/29/2017   Procedure: RADIOLOGY WITH ANESTHESIA MRI OF BRAIN WITH AND WITHOUT CONTRAST;  Surgeon: Radiologist, Medication, MD;  Location: [facility];  Service: Radiology;  Laterality: N/A;  . RADIOLOGY WITH ANESTHESIA N/A 12/07/2017   Procedure: MRI WITH ANESTHESIA OF BRAIN WITH AND WITHOUT CONTRAST;  Surgeon: Radiologist, Medication, MD;  Location: [facility];  Service: Radiology;  Laterality: N/A;  . RADIOLOGY WITH ANESTHESIA N/A 04/07/2018   Procedure: MRI OF BRAIN WITH AND WITHOUT CONTRAST;  Surgeon: Radiologist, Medication, MD;  Location: [facility];  Service: Radiology;  Laterality: N/A;  . RADIOLOGY WITH ANESTHESIA N/A 08/23/2018   Procedure: MRI WITH ANESTHESIA OF THE BRAIN WITH AND WITHOUT;  Surgeon: Radiologist, Medication, MD;  Location: [facility];  Service: Radiology;  Laterality: N/A;  . RADIOLOGY WITH ANESTHESIA N/A 01/24/2019   Procedure: MRI OF BRAIN WITH AND WITHOUT CONTRAST;  Surgeon: Radiologist, Medication, MD;  Location: [facility];  Service: Radiology;  Laterality: N/A;  . right power port placement Right      FAMILY HISTORY Family History  Problem Relation Age of Onset  . Diabetes Mother   . Bipolar disorder Mother   . CAD Father    The patient's father still living, age 11, in [place]. He had prostate cancer at some point in the past. The patient's mother died at age 63 from complications of diabetes. The patient had no brothers, 2 sisters. A paternal grandmother had lung cancer in the setting of tobacco abuse. There is no other history of cancer in the family to her knowledge  GYNECOLOGIC HISTORY:  No LMP recorded. Patient has had an ablation. Menarche approximately age 67. First live birth in 89. The patient is GX P2. She underwent endometrial ablation in 2016.  SOCIAL HISTORY: Updated August 2019 The patient is originally from  [place]. She has lived in [place] before but more recently was in [place]. She is back here because she could not afford her rent in [place]. She is living here and a temporary situation. She is divorced. Her 2 children are [name] who lives in [place] and works as a Development worker, community, and [name] who also lives in [place] and works as a Catering manager. The patient has a grandchild, [name], 50 years old as of July 2019, living in [place] with his mother. The patient also has a grandson born in February 2019, who also lives in [place]. The patient has not established herself with a local church yet.    ADVANCED DIRECTIVES: Not in place; at the 06/03/2016 visit the patient was given the appropriate forms to complete and notarize at her discretion   HEALTH MAINTENANCE: Social History   Tobacco Use  . Smoking status: Current Every Day Smoker    Packs/day: 0.50    Years: 43.00    Pack years: 21.50    Types: Cigarettes  . Smokeless tobacco: Never Used  Substance Use Topics  . Alcohol use: No    Comment:  no ETOH since 08/22/12  . Drug use: No    Comment: states she's in recovery program for 5 years     Colonoscopy:  PAP:  Bone density:   Allergies  Allergen Reactions  . Demerol [Meperidine Hcl] Itching and Nausea And Vomiting  . Erythromycin Rash    Current Outpatient Medications on File Prior to Visit  Medication Sig Dispense Refill  . anastrozole (ARIMIDEX) 1 MG tablet TAKE 1 TABLET BY MOUTH EVERY DAY 90 tablet 1  . Biotin 1000 MCG tablet Take 1,000 mcg by mouth daily.    . Cholecalciferol (VITAMIN D3) 5000 units CAPS Take 5,000 Units by mouth daily.    . cyclobenzaprine (FLEXERIL) 10 MG tablet Take 1 tablet (10 mg total) by mouth 2 (two) times daily. 60 tablet 3  . diazepam (VALIUM) 5 MG tablet Take 1 tab 1 hour prior to scan and may repeat if needed 2 tablet 0  . docusate sodium (COLACE) 100 MG capsule Take 100 mg by mouth daily as needed for mild constipation.     .  fluticasone (FLONASE) 50 MCG/ACT nasal spray Place 2 sprays into both nostrils daily. 16 g 4  . gabapentin (NEURONTIN) 300 MG capsule Take 1 capsule (300 mg total) by mouth 2 (two) times daily. (Patient taking differently: Take 300-600 mg by mouth See admin instructions. Take 300 mg by mouth in the morning and take 600 mg by mouth at bedtime) 60 capsule 3  . ibuprofen (ADVIL,MOTRIN) 800 MG tablet TAKE 1 TABLET BY MOUTH THREE TIMES A DAY (Patient taking differently: Take 800 mg by mouth every 8 (eight) hours as needed (pain). ) 90 tablet 0  . lamoTRIgine (LAMICTAL) 100 MG tablet Take 100 mg by mouth every morning.    . loratadine (CLARITIN) 10 MG tablet Take 1 tablet (10 mg total) by mouth daily. 30 tablet 6  . Lurasidone HCl (LATUDA) 60 MG TABS Take 60 mg by mouth daily.    . nicotine polacrilex (NICOTINE MINI) 4 MG lozenge Take 1 lozenge (4 mg total) by mouth as needed for smoking cessation. 100 tablet 0  . ondansetron (ZOFRAN) 8 MG tablet TAKE 1 TABLET (8 MG TOTAL) BY MOUTH EVERY 8 (EIGHT) HOURS AS NEEDED FOR NAUSEA OR VOMITING. 90 tablet 1  . pantoprazole (PROTONIX) 40 MG tablet Take 1 tablet (40 mg total) by mouth daily. 30 tablet 2  . polyethylene glycol (MIRALAX / GLYCOLAX) packet TAKE 17 G BY MOUTH DAILY AS NEEDED FOR MILD CONSTIPATION. 30 packet 0  . traZODone (DESYREL) 100 MG tablet Take 1 tablet (100 mg total) by mouth at bedtime. (Patient taking differently: Take 300 mg by mouth at bedtime. ) 30 tablet 0  . varenicline (CHANTIX) 0.5 MG tablet Take 0.5 mg by mouth daily.     No current facility-administered medications on file prior to visit.      OBJECTIVE:  Vitals:   01/25/19 1050  BP: 133/77  Pulse: 92  Resp: 18  Temp: 98 F (36.7 C)  TempSrc: Oral  SpO2: 96%  Weight: 169 lb (76.7 kg)  Height: 5' 5"  (1.651 m)  Body mass index is 28.12 kg/m.  ECOG FS: 1 - Symptomatic but completely ambulatory  GENERAL: Patient is a well appearing female in no acute distress HEENT:   Sclerae anicteric.  Oropharynx clear and moist. No ulcerations or evidence of oropharyngeal candidiasis. Neck is supple.  NODES:  No cervical, supraclavicular, or axillary lymphadenopathy palpated.  BREAST EXAM:  Deferred. LUNGS:  Clear to auscultation bilaterally.  No wheezes or rhonchi. HEART:  Regular rate and rhythm. No murmur appreciated. ABDOMEN:  Soft, nontender.  Positive, normoactive bowel sounds. No organomegaly palpated. MSK:  No focal spinal tenderness to palpation. Full range of motion bilaterally in the upper extremities. EXTREMITIES:  Slight left arm edema SKIN:  Clear with no obvious rashes or skin changes. No nail dyscrasia. NEURO:  Nonfocal. Well oriented.  Appropriate affect.      LAB RESULTS: No results found for: LABCA2  CBC    Component Value Date/Time   WBC 6.4 01/25/2019 1137   RBC 3.92 01/25/2019 1137   HGB 12.1 01/25/2019 1137   HGB 12.1 10/26/2017 1038   HCT 37.2 01/25/2019 1137   HCT 35.7 10/26/2017 1038   PLT 190 01/25/2019 1137   PLT 167 10/26/2017 1038   MCV 94.9 01/25/2019 1137   MCV 98.4 10/26/2017 1038   MCH 30.9 01/25/2019 1137   MCHC 32.5 01/25/2019 1137   RDW 12.7 01/25/2019 1137   RDW 13.3 10/26/2017 1038   LYMPHSABS 1.6 01/25/2019 1137   LYMPHSABS 0.5 (L) 10/26/2017 1038   MONOABS 0.5 01/25/2019 1137   MONOABS 0.1 10/26/2017 1038   EOSABS 0.0 01/25/2019 1137   EOSABS 0.0 10/26/2017 1038   BASOSABS 0.0 01/25/2019 1137   BASOSABS 0.0 10/26/2017 1038   CMP Latest Ref Rng & Units 01/03/2019 12/06/2018 11/08/2018  Glucose 70 - 99 mg/dL 106(H) 91 108(H)  BUN 6 - 20 mg/dL 10 12 11   Creatinine 0.44 - 1.00 mg/dL 1.11(H) 1.08(H) 1.02(H)  Sodium 135 - 145 mmol/L 141 141 140  Potassium 3.5 - 5.1 mmol/L 4.0 4.1 3.8  Chloride 98 - 111 mmol/L 102 105 106  CO2 22 - 32 mmol/L 30 24 25   Calcium 8.9 - 10.3 mg/dL 10.1 9.4 8.8(L)  Total Protein 6.5 - 8.1 g/dL 7.2 7.1 6.8  Total Bilirubin 0.3 - 1.2 mg/dL 0.4 0.3 0.2(L)  Alkaline Phos 38 - 126 U/L 63  62 62  AST 15 - 41 U/L 21 13(L) 15  ALT 0 - 44 U/L 20 12 11      STUDIES: Ct Chest W Contrast  Result Date: 12/30/2018 CLINICAL DATA:  Metastatic LEFT breast cancer. Diagnosed 2016 with brain and bone metastasis. LEFT mastectomy EXAM: CT CHEST WITH CONTRAST TECHNIQUE: Multidetector CT imaging of the chest was performed during intravenous contrast administration. CONTRAST:  48m OMNIPAQUE IOHEXOL 300 MG/ML  SOLN COMPARISON:  CT 07/01/2018 FINDINGS: Cardiovascular: Port in the anterior chest wall with tip in distal SVC. No significant vascular findings. Normal heart size. No pericardial effusion. Mediastinum/Nodes: Enlarged precarinal lymph node measures 15 mm short axis (image 52/2 compared to 12 mm. RIGHT paratracheal node measures 13 mm (image 73/5) compared to 11 mm. A potential RIGHT internal mammary node (image 53/5). No hilar adenopathy. The subcarinal adenopathy Lungs/Pleura: No suspicious pulmonary nodules. Mild pleural thickening in the RIGHT lung. Upper Abdomen: Limited view of the liver, kidneys, pancreas are unremarkable. Normal adrenal glands. Musculoskeletal: Stable sclerotic vertebral bodies at T5, T8,  T12. IMPRESSION: 1. Precarinal and RIGHT paratracheal adenopathy mildly increased from prior concerning for breast cancer metastasis recurrence. Recommend FDG PET scan further evaluation. 2. Stable sclerotic lesions in the thoracic spine consists with stable skeletal metastasis. Electronically Signed   By: SSuzy BouchardM.D.   On: 12/30/2018 15:17   Mr BJeri CosWRNContrast  Result Date: 01/24/2019 CLINICAL DATA:  Brain metastasis.  Metastatic breast cancer. EXAM: MRI HEAD WITHOUT AND WITH CONTRAST TECHNIQUE: Multiplanar, multiecho pulse sequences of the brain  and surrounding structures were obtained without and with intravenous contrast. CONTRAST:  7.5 mL Gadavist COMPARISON:  MRI brain 08/23/2018 FINDINGS: Brain: The patient is status post left suboccipital craniotomy for resection of  tumor. There is no enhancement to suggest residual recurrent tumor. Remote left cerebellar infarct is stable. Diffuse periventricular and subcortical confluent white matter changes likely reflect the sequelae of whole-brain radiation. There is no significant interval change. No new distal metastases are evident. Vascular: Flow is present in the major intracranial arteries. Skull and upper cervical spine: Post radiation changes of the marrow are noted in the upper cervical spine and skull base. No focal lesions are present. Craniocervical junction is otherwise normal. Sinuses/Orbits: The paranasal sinuses and mastoid air cells are clear. The globes and orbits are within normal limits. IMPRESSION: 1. Stable post treatment changes of the brain and upper cervical spine. 2. No focal enhancement or mass lesion to suggest residual recurrent metastasis. 3. Remote infarct of the left cerebellum is stable. Electronically Signed   By: San Morelle M.D.   On: 01/24/2019 12:39   Nm Pet Image Initial (pi) Skull Base To Thigh  Result Date: 01/19/2019 CLINICAL DATA:  Subsequent treatment strategy for breast carcinoma. RIGHT paratracheal adenopathy. Stage IV disease diagnosed 2016. EXAM: NUCLEAR MEDICINE PET SKULL BASE TO THIGH TECHNIQUE: 8.1 mCi F-18 FDG was injected intravenously. Full-ring PET imaging was performed from the skull base to thigh after the radiotracer. CT data was obtained and used for attenuation correction and anatomic localization. Fasting blood glucose: 103 mg/dl COMPARISON:  CT 12/30/2018 FINDINGS: Mediastinal blood pool activity: SUV max 2.3 NECK: No hypermetabolic lymph nodes in the neck. Incidental CT findings: none CHEST: Enlarged RIGHT lower tracheal lymph node measures 13 mm short axis has intense metabolic activity SUV max equal 6.7. No supraclavicular hypermetabolic nodes. No hypermetabolic internal mammary nodes. No hypermetabolic hilar nodes. No suspicious pulmonary nodules. Incidental CT  findings: Port in the anterior chest wall with tip in distal SVC. LEFT mastectomy with prosthetic. ABDOMEN/PELVIS: No abnormal hypermetabolic activity within the liver, pancreas, adrenal glands, or spleen. No hypermetabolic lymph nodes in the abdomen or pelvis. Incidental CT findings: Uterus and ovaries normal. SKELETON: Sclerotic lesion measuring 10 mm in the L3 vertebral body. Sclerotic lesion in the T8 vertebral body. Subtle sclerotic lesion T6 vertebral body. None of these sclerotic lesions have associated metabolic activity. Incidental CT findings: none IMPRESSION: 1. Intensely hypermetabolic and enlarged RIGHT paratracheal lymph node consistent with breast cancer recurrence. 2. No additional sites of metastatic disease on skull base to thigh FDG PET scan. 3. Several sclerotic lesions in the lumbar and thoracic spine without metabolic activity suggest treated skeletal metastasis. Electronically Signed   By: Suzy Bouchard M.D.   On: 01/19/2019 10:20   US Breast Ltd Uni Left Inc Axilla  Result Date: 01/02/2019 CLINICAL DATA:  58 year old female status post left mastectomy in 2016. Patient complains of swelling and tightness along the superior aspect of the mastectomy bed for several months. EXAM: DIGITAL DIAGNOSTIC RIGHT MAMMOGRAM WITH CAD AND TOMO ULTRASOUND LEFT MASTECTOMY BED COMPARISON:  Previous exam(s). ACR Breast Density Category c: The breast tissue is heterogeneously dense, which may obscure small masses. FINDINGS: No suspicious mammographic findings are identified on the right. The parenchymal pattern is stable. Mammographic images were processed with CAD. Targeted ultrasound is performed, showing normal soft tissue without focal or suspicious sonographic abnormality. Evaluation of the superior left mastectomy bed was performed. Underlying implant is identified. IMPRESSION: 1. No suspicious mammographic findings on the right.  2. No suspicious sonographic findings corresponding with the patient's  left mastectomy bed symptoms. Recommendation is for clinical and symptomatic follow-up. RECOMMENDATION: 1. Clinical follow-up recommended for the symptomatic area of concern in the left mastectomy bed. Any further workup should be based on clinical grounds. 2. Screening right breast mammogram in 1 year. I have discussed the findings and recommendations with the patient. Results were also provided in writing at the conclusion of the visit. If applicable, a reminder letter will be sent to the patient regarding the next appointment. BI-RADS CATEGORY  2: Benign. Electronically Signed   By: Kristopher Oppenheim M.D.   On: 01/02/2019 08:30   Mm Diag Breast Tomo Uni Right  Result Date: 01/02/2019 CLINICAL DATA:  58 year old female status post left mastectomy in 2016. Patient complains of swelling and tightness along the superior aspect of the mastectomy bed for several months. EXAM: DIGITAL DIAGNOSTIC RIGHT MAMMOGRAM WITH CAD AND TOMO ULTRASOUND LEFT MASTECTOMY BED COMPARISON:  Previous exam(s). ACR Breast Density Category c: The breast tissue is heterogeneously dense, which may obscure small masses. FINDINGS: No suspicious mammographic findings are identified on the right. The parenchymal pattern is stable. Mammographic images were processed with CAD. Targeted ultrasound is performed, showing normal soft tissue without focal or suspicious sonographic abnormality. Evaluation of the superior left mastectomy bed was performed. Underlying implant is identified. IMPRESSION: 1. No suspicious mammographic findings on the right. 2. No suspicious sonographic findings corresponding with the patient's left mastectomy bed symptoms. Recommendation is for clinical and symptomatic follow-up. RECOMMENDATION: 1. Clinical follow-up recommended for the symptomatic area of concern in the left mastectomy bed. Any further workup should be based on clinical grounds. 2. Screening right breast mammogram in 1 year. I have discussed the findings and  recommendations with the patient. Results were also provided in writing at the conclusion of the visit. If applicable, a reminder letter will be sent to the patient regarding the next appointment. BI-RADS CATEGORY  2: Benign. Electronically Signed   By: Kristopher Oppenheim M.D.   On: 01/02/2019 08:30    ELIGIBLE FOR AVAILABLE RESEARCH PROTOCOL: no  ASSESSMENT: 58 y.o. [place] woman with stage IV left-sided breast cancer involving bone and central nervous system  (1) s/p left breast lower inner quadrant biopsy 06/19/2015 for a clinical T2-3 NX invasive ductal carcinoma, grade 2, triple positive.  (2) status post left mastectomy and axillary lymph node dissection  with immediate expander placement 07/18/2015 for an mpT4 pN2,stage IIIB invasive ductal carcinoma, grade 3, with negative margins.  (a) definitive implant exchange to be scheduled in December   METASTATIC DISEASE: October 2016  (3) CT scan of the chest abdomen and pelvis  09/16/2015 shows metastatic lesions in the right scapula, left iliac crest, L4, and T spine. There were questionable liver cysts, with repeat CT scan 03/02/2016 showing possible right upper lobe lung lesions and possibly increased liver lesions  (a) CT scan of the chest 06/17/2016 shows no active disease in the lungs or liver  (b) Bone scan July 2017 showed no evidence of bony metastatic disease   (c) head CT 07/08/2016 showed a cerebellar lesion, confirmed by MRI 07/23/2016, status post craniotomy 08/14/2016, confirming a metastatic deposit which was estrogen and progesterone receptor negative, HER-2 amplified with a signals ratio of 7.16, number per cell 13.25  (d) CA 27-29 is not informative  (4) received docetaxel every 3 weeks 6 together with trastuzumab and pertuzumab, last docetaxel dose 02/11/2016  (5) adjuvant radiation7/03/2016 to 06/26/2016 at [place] [facility]: 1. The Left  chest wall was treated to 23.4 Gy in 13 fractions at 1.8 Gy per fraction. 2. The Left chest  wall was boosted to 10 Gy in 5 fractions at 2 Gy per fraction. 3. The Left Sclav/PAB was treated to 23.4 Gy in 13 fractions at 1.8 Gy per fraction.  [Note: Including the patient's treatment in [place] (received 15 fractions per Dr. Maryan Rued near [place], [place]), the patient received 50.4 Gy to the left chest wall and supraclavicular region. ]  (6) started trastuzumab and pertuzumab October 2016, continuing every 3 weeks,  (a) echocardiogram 02/26/2016 showed a well preserved ejection fraction  (b) echocardiogram 07/01/2016 shows an ejection fraction in the 60-65%   (c) pertuzumab discontinued 10/2016 with uncontrolled diarrhea  (d) echocardiogram 11/11/2016 showed an ejection fraction in the 60-65%  (e) echocardiogram 03/03/2017 shows an ejection fraction of 60-65%  (f) echocardiogram on 05/19/2017 shows an ejection fraction of 55-60%  (g) echocardiogram 09/24/2017 shows the ejection fraction in the 60-65%  (h) echocardiogram 02/14/2018 shows an ejection fraction in the 60-65%  (I) echocardiogram  06/30/2018 shows an ejection fraction in the 55-60%  (m) echocardiogram on 12/08/2018 shows an ejection fraction in the 60-65% range      (7) started denosumab/Xgeva October 2017 given every 4 weeks, transitioned to every 8 weeks beginning 10/11/18  (8) started anastrozole October 2017   (a) bone scan 11/10/2016 shows no active disease  (b) chest CT scan 11/10/2016 stable, with no evidence of active disease  (c) chest CT and bone scan 07/02/2017 show no evidence of active disease  (d) CT scan of the chest with contrast 11/10/2017 shows some left axillary edema, but no evidence of thrombosis or adenopathy in that area, 0.9 cm precarinal lymph and 0.7 cm right upper lobe nodule node; bone lesions stable  (e) CT of the chest 05/04/2018 shows a 1.4 cm right lower paratracheal node which is slightly increased and a new right prevascular mediastinal node measuring 0.7 cm.  Bone lesions are stable.  (f)  chest CT on 07/01/2018 shows no definite findings of metastatic disease in the thorax. Previously noted borderline enlarged low right paratracheal lymph node is stable to slightly decreased in size   (e) chest CT on 12/30/2018 notes mild increase in right paratracheal adneopathy, recommended PET scan.  Pet scan on 01/19/2019 showed a hypermetabolic and enlarged right paratracheal lymph node consistent with breast cancer recurrence.  (f)Trastuzumab discontinued due to scans below, TDM-1 to start on 01/31/2019.  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  (a) brain irradiation10/19/17-11/08/17: Whole brain/ 35 Gy in 14 fractions   (b) repeat brain MRI obtained 12/10/2016 shows no active disease in the brain  (c) repeat brain MRI 03/02/2017 shows no evidence of residual or recurrent disease  (d) repeat brain MRI 07/29/2017 shows no evidence of residual or recurrent disease  (e) repeat brain MRI 12/07/2017 shows no evidence of disease recurrence.  There is progressive white matter change secondary to prior treatment.  (f) repeat brain MRI 04/07/2018 showed no evidence of disease\  (g) repeat brain MRI on 08/23/2018 shows no evidence of disease  (h) repeat brain MRI on 01/25/2019 shows no  evidence of disease   PLAN: Bricelyn is doing well today.  She met with Dr. [name] to review her PET scan results.  I showed her the PET scan images and where the enlarged paratracheal lymph node is.  Dr. [name] noted that it is difficult to determine exactly what that lymph node is, and that a biopsy of the area would be very difficult.  He gave [place] two options.   She could either continue on her current treatment plan, and repeat PET scan in 3 months, OR she could change to [facility] and repeat PET scan in 3 months.    After thorough discussion about TDM1 and how it works, she will start this therapy next week.  I will see her prior to her treatment to make sure she is doing well, review everything again, and ensure her appointments/orders are set as they should be.    She will continue to receive Xgeva.  She knows to call for any other issues that may develop before the next visit.   Wilber Bihari, NP 01/25/19 12:30 PM Medical Oncology and Hematology [facility] [address] [place], [place] [address] Tel. [phone]    Fax. [phone]    ADDENDUM: I discussed the PET results in detail with [facility] and we also showed her the relevant images.  She understands we really do not know 400% sure that this is recurrence of her cancer.  If it is it may have transformed into a triple negative or at any rate HER-2 negative since it would be unusual for her to disease to progress under treatment.  We discussed changing her treatment to TDM 1 and she has a good understanding of the possible toxicities, side effects and complications of that agent, which she will start receiving next week.  She will also meet with radiation oncology for consideration of SRS treatment to this 1 spot  She knows to call for any other issues that may develop before the next visit.  I personally saw this patient and performed a substantive portion of this encounter with the listed APP documented above.   Chauncey Cruel, MD Medical Oncology and Hematology [facility] [address] [place], [place] [address] Tel. [phone]    Fax. [phone]

## 2019-01-26 ENCOUNTER — Other Ambulatory Visit: Payer: Self-pay

## 2019-01-26 ENCOUNTER — Ambulatory Visit: Payer: Self-pay | Admitting: Internal Medicine

## 2019-01-26 LAB — CANCER ANTIGEN 27.29: CA 27.29: 11 U/mL (ref 0.0–38.6)

## 2019-01-30 ENCOUNTER — Inpatient Hospital Stay: Payer: Medicaid Other

## 2019-01-30 ENCOUNTER — Other Ambulatory Visit: Payer: Self-pay | Admitting: Internal Medicine

## 2019-01-30 ENCOUNTER — Inpatient Hospital Stay: Payer: Medicaid Other | Attending: Medical | Admitting: Adult Health

## 2019-01-30 DIAGNOSIS — C7931 Secondary malignant neoplasm of brain: Secondary | ICD-10-CM | POA: Insufficient documentation

## 2019-01-30 DIAGNOSIS — C50312 Malignant neoplasm of lower-inner quadrant of left female breast: Secondary | ICD-10-CM | POA: Insufficient documentation

## 2019-01-30 DIAGNOSIS — Z5112 Encounter for antineoplastic immunotherapy: Secondary | ICD-10-CM | POA: Insufficient documentation

## 2019-01-30 DIAGNOSIS — C7951 Secondary malignant neoplasm of bone: Secondary | ICD-10-CM | POA: Insufficient documentation

## 2019-01-31 ENCOUNTER — Inpatient Hospital Stay: Payer: Medicaid Other

## 2019-01-31 ENCOUNTER — Institutional Professional Consult (permissible substitution): Payer: Self-pay | Admitting: Radiation Oncology

## 2019-01-31 ENCOUNTER — Other Ambulatory Visit: Payer: Self-pay

## 2019-01-31 ENCOUNTER — Other Ambulatory Visit: Payer: Self-pay | Admitting: Oncology

## 2019-01-31 VITALS — BP 130/79 | HR 93 | Temp 98.5°F | Resp 18

## 2019-01-31 DIAGNOSIS — C7951 Secondary malignant neoplasm of bone: Secondary | ICD-10-CM | POA: Diagnosis not present

## 2019-01-31 DIAGNOSIS — Z5112 Encounter for antineoplastic immunotherapy: Secondary | ICD-10-CM | POA: Diagnosis present

## 2019-01-31 DIAGNOSIS — C50919 Malignant neoplasm of unspecified site of unspecified female breast: Secondary | ICD-10-CM

## 2019-01-31 DIAGNOSIS — C50312 Malignant neoplasm of lower-inner quadrant of left female breast: Secondary | ICD-10-CM

## 2019-01-31 DIAGNOSIS — C7931 Secondary malignant neoplasm of brain: Secondary | ICD-10-CM | POA: Diagnosis not present

## 2019-01-31 LAB — CBC WITH DIFFERENTIAL/PLATELET
Abs Immature Granulocytes: 0.02 10*3/uL (ref 0.00–0.07)
Basophils Absolute: 0 10*3/uL (ref 0.0–0.1)
Basophils Relative: 1 %
Eosinophils Absolute: 0 10*3/uL (ref 0.0–0.5)
Eosinophils Relative: 1 %
HCT: 39.2 % (ref 36.0–46.0)
Hemoglobin: 12.8 g/dL (ref 12.0–15.0)
Immature Granulocytes: 1 %
Lymphocytes Relative: 22 %
Lymphs Abs: 0.9 10*3/uL (ref 0.7–4.0)
MCH: 30.8 pg (ref 26.0–34.0)
MCHC: 32.7 g/dL (ref 30.0–36.0)
MCV: 94.5 fL (ref 80.0–100.0)
Monocytes Absolute: 0.3 10*3/uL (ref 0.1–1.0)
Monocytes Relative: 8 %
Neutro Abs: 2.7 10*3/uL (ref 1.7–7.7)
Neutrophils Relative %: 67 %
Platelets: 159 10*3/uL (ref 150–400)
RBC: 4.15 MIL/uL (ref 3.87–5.11)
RDW: 12.6 % (ref 11.5–15.5)
WBC: 3.9 10*3/uL — ABNORMAL LOW (ref 4.0–10.5)
nRBC: 0 % (ref 0.0–0.2)

## 2019-01-31 LAB — COMPREHENSIVE METABOLIC PANEL
ALT: 14 U/L (ref 0–44)
AST: 17 U/L (ref 15–41)
Albumin: 3.9 g/dL (ref 3.5–5.0)
Alkaline Phosphatase: 58 U/L (ref 38–126)
Anion gap: 14 (ref 5–15)
BUN: 13 mg/dL (ref 6–20)
CO2: 25 mmol/L (ref 22–32)
Calcium: 9.7 mg/dL (ref 8.9–10.3)
Chloride: 104 mmol/L (ref 98–111)
Creatinine, Ser: 1.05 mg/dL — ABNORMAL HIGH (ref 0.44–1.00)
GFR calc Af Amer: >60 mL/min (ref >60)
GFR calc non Af Amer: 59 mL/min — ABNORMAL LOW (ref >60)
Glucose, Bld: 125 mg/dL — ABNORMAL HIGH (ref 70–99)
Potassium: 4 mmol/L (ref 3.5–5.1)
Sodium: 143 mmol/L (ref 135–145)
Total Bilirubin: 0.2 mg/dL — ABNORMAL LOW (ref 0.3–1.2)
Total Protein: 7.2 g/dL (ref 6.5–8.1)

## 2019-01-31 MED ORDER — ACETAMINOPHEN 325 MG PO TABS
ORAL_TABLET | ORAL | Status: AC
  Filled 2019-01-31: qty 2

## 2019-01-31 MED ORDER — ACETAMINOPHEN 325 MG PO TABS
650.0000 mg | ORAL_TABLET | Freq: Once | ORAL | Status: AC
  Administered 2019-01-31: 650 mg via ORAL

## 2019-01-31 MED ORDER — DENOSUMAB 120 MG/1.7ML ~~LOC~~ SOLN
120.0000 mg | Freq: Once | SUBCUTANEOUS | Status: AC
  Administered 2019-01-31: 120 mg via SUBCUTANEOUS

## 2019-01-31 MED ORDER — SODIUM CHLORIDE 0.9% FLUSH
10.0000 mL | INTRAVENOUS | Status: DC | PRN
  Administered 2019-01-31: 10 mL
  Filled 2019-01-31: qty 10

## 2019-01-31 MED ORDER — DIPHENHYDRAMINE HCL 25 MG PO CAPS
ORAL_CAPSULE | ORAL | Status: AC
  Filled 2019-01-31: qty 1

## 2019-01-31 MED ORDER — DENOSUMAB 120 MG/1.7ML ~~LOC~~ SOLN
SUBCUTANEOUS | Status: AC
  Filled 2019-01-31: qty 1.7

## 2019-01-31 MED ORDER — HEPARIN SOD (PORK) LOCK FLUSH 100 UNIT/ML IV SOLN
500.0000 [IU] | Freq: Once | INTRAVENOUS | Status: AC | PRN
  Administered 2019-01-31: 500 [IU]
  Filled 2019-01-31: qty 5

## 2019-01-31 MED ORDER — SODIUM CHLORIDE 0.9 % IV SOLN
3.4000 mg/kg | Freq: Once | INTRAVENOUS | Status: AC
  Administered 2019-01-31: 260 mg via INTRAVENOUS
  Filled 2019-01-31: qty 8

## 2019-01-31 MED ORDER — SODIUM CHLORIDE 0.9 % IV SOLN
Freq: Once | INTRAVENOUS | Status: AC
  Administered 2019-01-31: 10:00:00 via INTRAVENOUS
  Filled 2019-01-31: qty 250

## 2019-01-31 MED ORDER — DIPHENHYDRAMINE HCL 25 MG PO CAPS
25.0000 mg | ORAL_CAPSULE | Freq: Once | ORAL | Status: AC
  Administered 2019-01-31: 25 mg via ORAL

## 2019-01-31 NOTE — Progress Notes (Signed)
Patient requests to leave before completing 90-minute post-infusion observation. Educated patient on potential problems including symptoms similar to an allergic reaction which could lead to death. [name] states she understands potential consequences of leaving before recommended time and assumes responsibility for this action. Educated patient on the importance of seeking immediate emergency care if these symptoms arise. Patient verbalized understanding and agrees. Patient left against medical advice; however, patient was stable with no concerns when leaving the infusion room.

## 2019-01-31 NOTE — Patient Instructions (Signed)
Ado-Trastuzumab Emtansine for injection What is this medicine? ADO-TRASTUZUMAB EMTANSINE (ADD oh traz TOO zuh mab em TAN zine) is a monoclonal antibody combined with chemotherapy. It is used to treat breast cancer. This medicine may be used for other purposes; ask your health care provider or pharmacist if you have questions. COMMON BRAND NAME(S): Kadcyla What should I tell my health care provider before I take this medicine? They need to know if you have any of these conditions: -heart disease -heart failure -infection (especially a virus infection such as chickenpox, cold sores, or herpes) -liver disease -lung or breathing disease, like asthma -an unusual or allergic reaction to ado-trastuzumab emtansine, other medications, foods, dyes, or preservatives -pregnant or trying to get pregnant -breast-feeding How should I use this medicine? This medicine is for infusion into a vein. It is given by a health care professional in a hospital or clinic setting. Talk to your pediatrician regarding the use of this medicine in children. Special care may be needed. Overdosage: If you think you have taken too much of this medicine contact a poison control center or emergency room at once. NOTE: This medicine is only for you. Do not share this medicine with others. What if I miss a dose? It is important not to miss your dose. Call your doctor or health care professional if you are unable to keep an appointment. What may interact with this medicine? This medicine may also interact with the following medications: -atazanavir -boceprevir -clarithromycin -delavirdine -indinavir -dalfopristin; quinupristin -isoniazid, INH -itraconazole -ketoconazole -nefazodone -nelfinavir -ritonavir -telaprevir -telithromycin -tipranavir -voriconazole This list may not describe all possible interactions. Give your health care provider a list of all the medicines, herbs, non-prescription drugs, or dietary  supplements you use. Also tell them if you smoke, drink alcohol, or use illegal drugs. Some items may interact with your medicine. What should I watch for while using this medicine? Visit your doctor for checks on your progress. This drug may make you feel generally unwell. This is not uncommon, as chemotherapy can affect healthy cells as well as cancer cells. Report any side effects. Continue your course of treatment even though you feel ill unless your doctor tells you to stop. You may need blood work done while you are taking this medicine. Call your doctor or health care professional for advice if you get a fever, chills or sore throat, or other symptoms of a cold or flu. Do not treat yourself. This drug decreases your body's ability to fight infections. Try to avoid being around people who are sick. Be careful brushing and flossing your teeth or using a toothpick because you may get an infection or bleed more easily. If you have any dental work done, tell your dentist you are receiving this medicine. Avoid taking products that contain aspirin, acetaminophen, ibuprofen, naproxen, or ketoprofen unless instructed by your doctor. These medicines may hide a fever. Do not become pregnant while taking this medicine or for 7 months after stopping it, men with female partners should use contraception during treatment and for 4 months after the last dose. Women should inform their doctor if they wish to become pregnant or think they might be pregnant. There is a potential for serious side effects to an unborn child. Do not breast-feed an infant while taking this medicine or for 7 months after the last dose. Men who have a partner who is pregnant or who is capable of becoming pregnant should use a condom during sexual activity while taking this medicine and for   4 months after stopping it. Men should inform their doctors if they wish to father a child. This medicine may lower sperm counts. Talk to your health care  professional or pharmacist for more information. What side effects may I notice from receiving this medicine? Side effects that you should report to your doctor or health care professional as soon as possible: -allergic reactions like skin rash, itching or hives, swelling of the face, lips, or tongue -breathing problems -chest pain or palpitations -fever or chills, sore throat -general ill feeling or flu-like symptoms -light-colored stools -nausea, vomiting -pain, tingling, numbness in the hands or feet -signs and symptoms of bleeding such as bloody or black, tarry stools; red or dark-brown urine; spitting up blood or brown material that looks like coffee grounds; red spots on the skin; unusual bruising or bleeding from the eye, gums, or nose -swelling of the legs or ankles -yellowing of the eyes or skin Side effects that usually do not require medical attention (report to your doctor or health care professional if they continue or are bothersome): -changes in taste -constipation -dizziness -headache -joint pain -muscle pain -trouble sleeping -unusually weak or tired This list may not describe all possible side effects. Call your doctor for medical advice about side effects. You may report side effects to FDA at 1-800-FDA-1088. Where should I keep my medicine? This drug is given in a hospital or clinic and will not be stored at home. NOTE: This sheet is a summary. It may not cover all possible information. If you have questions about this medicine, talk to your doctor, pharmacist, or health care provider.  2019 Elsevier/Gold Standard (2016-01-06 12:11:06)  

## 2019-02-01 ENCOUNTER — Other Ambulatory Visit: Payer: Self-pay | Admitting: Oncology

## 2019-02-01 LAB — CANCER ANTIGEN 27.29: CA 27.29: 11.9 U/mL (ref 0.0–38.6)

## 2019-02-02 ENCOUNTER — Other Ambulatory Visit: Payer: Self-pay

## 2019-02-02 ENCOUNTER — Encounter: Payer: Self-pay | Admitting: Radiation Oncology

## 2019-02-02 ENCOUNTER — Ambulatory Visit
Admission: RE | Admit: 2019-02-02 | Discharge: 2019-02-02 | Disposition: A | Discharge status: Home / Self Care | Payer: Medicaid Other | Source: Ambulatory Visit | Attending: Radiation Oncology | Admitting: Radiation Oncology

## 2019-02-02 VITALS — BP 142/90 | HR 119 | Temp 97.9°F | Ht 65.0 in | Wt 170.2 lb

## 2019-02-02 DIAGNOSIS — C7931 Secondary malignant neoplasm of brain: Secondary | ICD-10-CM | POA: Insufficient documentation

## 2019-02-02 DIAGNOSIS — F17210 Nicotine dependence, cigarettes, uncomplicated: Secondary | ICD-10-CM | POA: Insufficient documentation

## 2019-02-02 DIAGNOSIS — C781 Secondary malignant neoplasm of mediastinum: Secondary | ICD-10-CM | POA: Insufficient documentation

## 2019-02-02 DIAGNOSIS — R51 Headache: Secondary | ICD-10-CM | POA: Diagnosis not present

## 2019-02-02 DIAGNOSIS — Z9221 Personal history of antineoplastic chemotherapy: Secondary | ICD-10-CM | POA: Insufficient documentation

## 2019-02-02 DIAGNOSIS — C50919 Malignant neoplasm of unspecified site of unspecified female breast: Secondary | ICD-10-CM

## 2019-02-02 DIAGNOSIS — M129 Arthropathy, unspecified: Secondary | ICD-10-CM | POA: Insufficient documentation

## 2019-02-02 DIAGNOSIS — C770 Secondary and unspecified malignant neoplasm of lymph nodes of head, face and neck: Secondary | ICD-10-CM | POA: Diagnosis present

## 2019-02-02 DIAGNOSIS — Z79899 Other long term (current) drug therapy: Secondary | ICD-10-CM | POA: Diagnosis not present

## 2019-02-02 DIAGNOSIS — M797 Fibromyalgia: Secondary | ICD-10-CM | POA: Insufficient documentation

## 2019-02-02 DIAGNOSIS — Z170 Estrogen receptor positive status [ER+]: Secondary | ICD-10-CM | POA: Insufficient documentation

## 2019-02-02 DIAGNOSIS — C50312 Malignant neoplasm of lower-inner quadrant of left female breast: Secondary | ICD-10-CM | POA: Insufficient documentation

## 2019-02-02 DIAGNOSIS — G8929 Other chronic pain: Secondary | ICD-10-CM | POA: Insufficient documentation

## 2019-02-02 DIAGNOSIS — F4310 Post-traumatic stress disorder, unspecified: Secondary | ICD-10-CM | POA: Diagnosis not present

## 2019-02-02 DIAGNOSIS — F319 Bipolar disorder, unspecified: Secondary | ICD-10-CM | POA: Insufficient documentation

## 2019-02-02 DIAGNOSIS — Z923 Personal history of irradiation: Secondary | ICD-10-CM | POA: Insufficient documentation

## 2019-02-02 DIAGNOSIS — K219 Gastro-esophageal reflux disease without esophagitis: Secondary | ICD-10-CM | POA: Diagnosis not present

## 2019-02-03 ENCOUNTER — Encounter: Payer: Self-pay | Admitting: General Practice

## 2019-02-03 NOTE — Progress Notes (Signed)
[place] Psychosocial Distress Screening Clinical Social Work  Clinical Social Work was referred by distress screening protocol.  The patient scored a 6 on the Psychosocial Distress Thermometer which indicates moderate distress. Clinical Social Worker contacted patient by phone to assess for distress and other psychosocial needs. Unable to reach patient by phone, VM left w generic information about [place], contact information and encouragement to call for support/resources as needed.    [facility] DISTRESS SCREENING 02/02/2019  Screening Type Initial Screening  Distress experienced in past week (1-10) 6  Practical problem type   Emotional problem type Nervousness/Anxiety;Boredom  Spiritual/Religous concerns type Relating to God  Information Concerns Type Lack of info about maintaining fitness  Physical Problem type Pain  Physician notified of physical symptoms   Referral to clinical social work     Clinical Social Worker follow up needed: No.  If yes, follow up plan:  Beverely Pace, [place], LCSW Clinical Social Worker Phone:  [phone]

## 2019-02-05 NOTE — Addendum Note (Signed)
Encounter addended by: Hayden Pedro, PA-C on: 02/05/2019 7:42 PM  Actions taken: LOS modified

## 2019-02-05 NOTE — Progress Notes (Signed)
Radiation Oncology         [phone] ________________________________  Name: [name] MRN: [id number]  Date: 02/02/2019  DOB: 1961/09/17  TI:[id number], [name], MD  [name], MD     REFERRING PHYSICIAN: [name], MD   DIAGNOSIS: The encounter diagnosis was Metastatic breast cancer ([place]).   HISTORY OF PRESENT ILLNESS: [name] is a 58 y.o. female with a history of stage IV breast cancer. She noticed a lump the size of a lemon in her left axillary region for a year prior to her mammogram. She requested a mammogram after her breast started swelling. Diagnostic imaging followed by biopsy in August 2016 revealed an invasive lobular carcinoma, triple positive. She subsequently underwent mastectomy with axillary lymph node dissection in tissue expander placement on 08/21/2015. The tumor was consistent with invasive indeterminate grade mammary carcinoma with mixed ductal and lobular features, and high-grade ductal carcinoma in situ, the tumor size was 1.3 cm, sentinel axillary lymph nodes demonstrated 4 out of 15 being positive for metastatic carcinoma. She was found to have a sclerotic lesion in the posterior left iliac bone, there is questionable lesions in the liver, as well as pulmonary nodules. She began systemic chemotherapy in November 2016 with Taxotere, Herceptin projection. She completed her Taxotere and March 2017 and continues on Herceptin and Perjeta with monthly Xgeva. She did have a posttreatment CT scan to restage on 03/02/2016, this revealed multiple new ill-defined right upper lobe pulmonary parenchymal nodules, lobular dependent pleural fluid and potential mild pleural thickening bilaterally, numerous low-attenuation foci again are present within the liver slightly increased that have not significantly changed in size. The sclerotic lesion in the posterior left iliac bone was stable. She began radiotherapy on 05/08/2016 and was to receive 33 fractions. She has moved  to [place], received the remainder of her radiotherapy. 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 She's been followed since in surveillance and has had stability of the resection cavity and of the brain in general.  She recently underwent MRI of the brain on 01/24/2019 that was negative for any new metastatic disease.  Stability of her brain was again noted.  Recommendations in conference were to continue in surveillance.  She recently underwent a restaging PET scan on 01/19/2019 which revealed only a single site of disease in the right paratracheal region with a lymph node measuring 13 mm.  She began TDM 1 infusions along with her antiestrogen therapy, and comes today to discuss options of stereotactic body radiotherapy to the paratracheal lymph node.   PREVIOUS RADIATION THERAPY:   09/17/16-10/07/16: Whole brain treated to 35 Gy in 14 fractions  06/03/16-06/26/16: Completion of treatment to the left chest wall and nodes in [facility] with Dr. Lisbeth Renshaw: total of 45Gy  Summer 2017: received radiotherapy to the left chest wall and regional lymph nodes, received 15 fractions per Dr. Maryan Rued near [place], [place].   PAST MEDICAL HISTORY:  Past Medical History:  Diagnosis Date  . Alcohol abuse   . Anemia    during chemo  . Anxiety    At age 10  . Arthritis Dx 2010  . Bipolar disorder ([place])   . Bronchitis   . Cancer ([place])    breast mets to brain  . Chronic pain   . Complication of  anesthesia   . Depression   . Family history of adverse reaction to anesthesia    MOther had PONV  . Fibromyalgia Dx 2005  . GERD (gastroesophageal reflux disease)   . Headache    hx  migraines  . Lymphedema of left arm   . Opiate dependence ([place])   . PONV  (postoperative nausea and vomiting)   . Port-A-Cath in place   . PTSD (post-traumatic stress disorder)        PAST SURGICAL HISTORY: Past Surgical History:  Procedure Laterality Date  . APPLICATION OF CRANIAL NAVIGATION N/A 08/14/2016   Procedure: APPLICATION OF CRANIAL NAVIGATION;  Surgeon: Erline Levine, MD;  Location: [place] [facility];  Service: Neurosurgery;  Laterality: N/A;  . BREAST RECONSTRUCTION Left    with silicone implant  . COLONOSCOPY W/ POLYPECTOMY    . CRANIOTOMY N/A 08/14/2016   Procedure: CRANIOTOMY TUMOR EXCISION WITH Lucky Rathke;  Surgeon: Erline Levine, MD;  Location: [place] [facility];  Service: Neurosurgery;  Laterality: N/A;  . FIBULA FRACTURE SURGERY Left   . MASTECTOMY Left   . RADIOLOGY WITH ANESTHESIA N/A 07/23/2016   Procedure: MRI OF BRAIN WITH AND WITHOUT;  Surgeon: Medication Radiologist, MD;  Location: [place];  Service: Radiology;  Laterality: N/A;  . RADIOLOGY WITH ANESTHESIA N/A 09/08/2016   Procedure: MRI OF BRAIN WITH AND WITHOUT CONTRAST;  Surgeon: Medication Radiologist, MD;  Location: [place];  Service: Radiology;  Laterality: N/A;  . RADIOLOGY WITH ANESTHESIA N/A 12/10/2016   Procedure: MRI OF BRAIN WITH AND WITHOUT;  Surgeon: Medication Radiologist, MD;  Location: [place];  Service: Radiology;  Laterality: N/A;  . RADIOLOGY WITH ANESTHESIA N/A 03/02/2017   Procedure: MRI of BRAIN W and W/OUT CONTRAST;  Surgeon: Medication Radiologist, MD;  Location: [place];  Service: Radiology;  Laterality: N/A;  . RADIOLOGY WITH ANESTHESIA N/A 07/29/2017   Procedure: RADIOLOGY WITH ANESTHESIA MRI OF BRAIN WITH AND WITHOUT CONTRAST;  Surgeon: Radiologist, Medication, MD;  Location: [place];  Service: Radiology;  Laterality: N/A;  . RADIOLOGY WITH ANESTHESIA N/A 12/07/2017   Procedure: MRI WITH ANESTHESIA OF BRAIN WITH AND WITHOUT CONTRAST;  Surgeon: Radiologist, Medication, MD;  Location: [place];  Service: Radiology;  Laterality: N/A;  . RADIOLOGY WITH ANESTHESIA N/A 04/07/2018   Procedure: MRI  OF BRAIN WITH AND WITHOUT CONTRAST;  Surgeon: Radiologist, Medication, MD;  Location: [place];  Service: Radiology;  Laterality: N/A;  . RADIOLOGY WITH ANESTHESIA N/A 08/23/2018   Procedure: MRI WITH ANESTHESIA OF THE BRAIN WITH AND WITHOUT;  Surgeon: Radiologist, Medication, MD;  Location: [facility];  Service: Radiology;  Laterality: N/A;  . RADIOLOGY WITH ANESTHESIA N/A 01/24/2019   Procedure: MRI OF BRAIN WITH AND WITHOUT CONTRAST;  Surgeon: Radiologist, Medication, MD;  Location: [place];  Service: Radiology;  Laterality: N/A;  . right power port placement Right      FAMILY HISTORY:  Family History  Problem Relation Age of Onset  . Diabetes Mother   . Bipolar disorder Mother   . CAD Father      SOCIAL HISTORY:  reports that she has been smoking cigarettes. She has a 21.50 pack-year smoking history. She has never used smokeless tobacco. She reports that she does not drink alcohol or use drugs.She is divorced and lives in [place]. She is a recovering alcoholic and had been clean also from using prescription pain pills for about 4 years time when we met her in 2017, however has had episodes of non compliance with her medical care with suspicion for  intermittent drug use.  ALLERGIES: Demerol [meperidine hcl] and Erythromycin   MEDICATIONS:  Current Outpatient Medications  Medication Sig Dispense Refill  . anastrozole (ARIMIDEX) 1 MG tablet TAKE 1 TABLET BY MOUTH EVERY DAY 90 tablet 1  . Biotin 1000 MCG tablet Take 1,000 mcg by mouth daily.    . Cholecalciferol (VITAMIN D3) 5000 units CAPS Take 5,000 Units by mouth daily.    . cyclobenzaprine (FLEXERIL) 10 MG tablet Take 1 tablet (10 mg total) by mouth 2 (two) times daily. 60 tablet 3  . diazepam (VALIUM) 5 MG tablet Take 1 tab 1 hour prior to scan and may repeat if needed 2 tablet 0  . docusate sodium (COLACE) 100 MG capsule Take 100 mg by mouth daily as needed for mild constipation.     . fluticasone (FLONASE) 50 MCG/ACT nasal spray Place 2  sprays into both nostrils daily. 16 g 4  . gabapentin (NEURONTIN) 300 MG capsule Take 1 capsule (300 mg total) by mouth 2 (two) times daily. (Patient taking differently: Take 300-600 mg by mouth See admin instructions. Take 300 mg by mouth in the morning and take 600 mg by mouth at bedtime) 60 capsule 3  . ibuprofen (ADVIL,MOTRIN) 800 MG tablet TAKE 1 TABLET BY MOUTH THREE TIMES A DAY 90 tablet 0  . lamoTRIgine (LAMICTAL) 100 MG tablet Take 100 mg by mouth every morning.    . loratadine (CLARITIN) 10 MG tablet Take 1 tablet (10 mg total) by mouth daily. 30 tablet 6  . Lurasidone HCl (LATUDA) 60 MG TABS Take 60 mg by mouth daily.    . ondansetron (ZOFRAN) 8 MG tablet TAKE 1 TABLET (8 MG TOTAL) BY MOUTH EVERY 8 (EIGHT) HOURS AS NEEDED FOR NAUSEA OR VOMITING. 90 tablet 1  . pantoprazole (PROTONIX) 40 MG tablet Take 1 tablet (40 mg total) by mouth daily. 30 tablet 2  . polyethylene glycol (MIRALAX / GLYCOLAX) packet TAKE 17 G BY MOUTH DAILY AS NEEDED FOR MILD CONSTIPATION. 30 packet 0  . traZODone (DESYREL) 100 MG tablet Take 1 tablet (100 mg total) by mouth at bedtime. (Patient taking differently: Take 300 mg by mouth at bedtime. ) 30 tablet 0  . varenicline (CHANTIX) 0.5 MG tablet Take 0.5 mg by mouth daily.    . nicotine polacrilex (NICOTINE MINI) 4 MG lozenge Take 1 lozenge (4 mg total) by mouth as needed for smoking cessation. (Patient not taking: Reported on 02/02/2019) 100 tablet 0   No current facility-administered medications for this encounter.     REVIEW OF SYSTEMS: On review of systems, the patient reports that she is doing well overall. She denies any chest pain, shortness of breath, cough, fevers, chills, night sweats, unintended weight changes. She denies any bowel or bladder disturbances, and denies abdominal pain, nausea or vomiting. She denies any new musculoskeletal or joint aches or pains, new skin lesions or concerns. A complete review of systems is obtained and is otherwise  negative.    PHYSICAL EXAM:  Wt Readings from Last 3 Encounters:  02/02/19 170 lb 3.2 oz (77.2 kg)  01/25/19 169 lb (76.7 kg)  01/24/19 164 lb (74.4 kg)   Temp Readings from Last 3 Encounters:  02/02/19 97.9 F (36.6 C) (Oral)  01/31/19 98.5 F (36.9 C) (Oral)  01/25/19 98 F (36.7 C) (Oral)   BP Readings from Last 3 Encounters:  02/02/19 (!) 142/90  01/31/19 130/79  01/25/19 133/77   Pulse Readings from Last 3 Encounters:  02/02/19 (!) 119  01/31/19 93  01/25/19 92   Pain Assessment Pain Loc: (stiffness to neck, shoulders. )/10 In general this is a well appearing caucasian female in no acute distress. She's alert and oriented x4 and appropriate throughout the examination. Cardiopulmonary assessment is negative for acute distress and she exhibits normal effort.  No focal neurologic deficits are noted grossly.   ECOG = 0 0 - Asymptomatic (Fully active, able to carry on all predisease activities without restriction)  1 - Symptomatic but completely ambulatory (Restricted in physically strenuous activity but ambulatory and able to carry out work of a light or sedentary nature. For example, light housework, office work)  2 - Symptomatic, <50% in bed during the day (Ambulatory and capable of all self care but unable to carry out any work activities. Up and about more than 50% of waking hours)  3 - Symptomatic, >50% in bed, but not bedbound (Capable of only limited self-care, confined to bed or chair 50% or more of waking hours)  4 - Bedbound (Completely disabled. Cannot carry on any self-care. Totally confined to bed or chair)  5 - Death   Eustace Pen MM, Creech RH, Tormey DC, et al. [phone]). "Toxicity and response criteria of the [facility]". [place] Oncol. 5 (6): 649-55    LABORATORY DATA:  Lab Results  Component Value Date   WBC 3.9 (L) 01/31/2019   HGB 12.8 01/31/2019   HCT 39.2 01/31/2019   MCV 94.5 01/31/2019   PLT 159 01/31/2019   Lab  Results  Component Value Date   NA 143 01/31/2019   K 4.0 01/31/2019   CL 104 01/31/2019   CO2 25 01/31/2019   Lab Results  Component Value Date   ALT 14 01/31/2019   AST 17 01/31/2019   ALKPHOS 58 01/31/2019   BILITOT 0.2 (L) 01/31/2019      RADIOGRAPHY: Mr Jeri Cos XY Contrast  Result Date: 01/24/2019 CLINICAL DATA:  Brain metastasis.  Metastatic breast cancer. EXAM: MRI HEAD WITHOUT AND WITH CONTRAST TECHNIQUE: Multiplanar, multiecho pulse sequences of the brain and surrounding structures were obtained without and with intravenous contrast. CONTRAST:  7.5 mL Gadavist COMPARISON:  MRI brain 08/23/2018 FINDINGS: Brain: The patient is status post left suboccipital craniotomy for resection of tumor. There is no enhancement to suggest residual recurrent tumor. Remote left cerebellar infarct is stable. Diffuse periventricular and subcortical confluent white matter changes likely reflect the sequelae of whole-brain radiation. There is no significant interval change. No new distal metastases are evident. Vascular: Flow is present in the major intracranial arteries. Skull and upper cervical spine: Post radiation changes of the marrow are noted in the upper cervical spine and skull base. No focal lesions are present. Craniocervical junction is otherwise normal. Sinuses/Orbits: The paranasal sinuses and mastoid air cells are clear. The globes and orbits are within normal limits. IMPRESSION: 1. Stable post treatment changes of the brain and upper cervical spine. 2. No focal enhancement or mass lesion to suggest residual recurrent metastasis. 3. Remote infarct of the left cerebellum is stable. Electronically Signed   By: San Morelle M.D.   On: 01/24/2019 12:39   Nm Pet Image Initial (pi) Skull Base To Thigh  Result Date: 01/19/2019 CLINICAL DATA:  Subsequent treatment strategy for breast carcinoma. RIGHT paratracheal adenopathy. Stage IV disease diagnosed 2016. EXAM: NUCLEAR MEDICINE PET SKULL  BASE TO THIGH TECHNIQUE: 8.1 mCi F-18 FDG was injected intravenously. Full-ring PET imaging was performed from the skull base to thigh after the radiotracer. CT data was obtained  and used for attenuation correction and anatomic localization. Fasting blood glucose: 103 mg/dl COMPARISON:  CT 12/30/2018 FINDINGS: Mediastinal blood pool activity: SUV max 2.3 NECK: No hypermetabolic lymph nodes in the neck. Incidental CT findings: none CHEST: Enlarged RIGHT lower tracheal lymph node measures 13 mm short axis has intense metabolic activity SUV max equal 6.7. No supraclavicular hypermetabolic nodes. No hypermetabolic internal mammary nodes. No hypermetabolic hilar nodes. No suspicious pulmonary nodules. Incidental CT findings: Port in the anterior chest wall with tip in distal SVC. LEFT mastectomy with prosthetic. ABDOMEN/PELVIS: No abnormal hypermetabolic activity within the liver, pancreas, adrenal glands, or spleen. No hypermetabolic lymph nodes in the abdomen or pelvis. Incidental CT findings: Uterus and ovaries normal. SKELETON: Sclerotic lesion measuring 10 mm in the L3 vertebral body. Sclerotic lesion in the T8 vertebral body. Subtle sclerotic lesion T6 vertebral body. None of these sclerotic lesions have associated metabolic activity. Incidental CT findings: none IMPRESSION: 1. Intensely hypermetabolic and enlarged RIGHT paratracheal lymph node consistent with breast cancer recurrence. 2. No additional sites of metastatic disease on skull base to thigh FDG PET scan. 3. Several sclerotic lesions in the lumbar and thoracic spine without metabolic activity suggest treated skeletal metastasis. Electronically Signed   By: Suzy Bouchard M.D.   On: 01/19/2019 10:20       IMPRESSION/PLAN:  1. Stage IV, T4, N3, M1 invasive lobular and ductal carcinoma, ER positive, HER-2 positive the left breast with metastatic disease to bone and brain, and paratracheal lymph node.  Dr. Lisbeth Renshaw discusses the findings from her recent  PET imaging, and the rationale to consider stereotactic body radiotherapy (SBRT) to style therapy to this location.  He anticipates a course of 10 fractions to this location.  We discussed the risks, benefits, short and long-term effects of therapy, and the patient is interested in proceeding.  We also reviewed her most recent MRI of the brain which was reassuring, and she would be continued in surveillance for this as well.  She plans to continue TDM 1 with Dr. [name] along with her antiestrogen therapy.Written consent is obtained and placed in the chart, a copy was provided to the patient.  She will proceed with simulation and be contacted by our staff to coordinate this.  In a visit lasting 25 minutes, greater than 50% of the time was spent face to face discussing her symptoms, and coordinating the patient's care.    Carola Rhine, PAC

## 2019-02-09 ENCOUNTER — Ambulatory Visit: Payer: Medicaid Other | Admitting: Radiation Oncology

## 2019-02-21 ENCOUNTER — Ambulatory Visit
Admission: RE | Admit: 2019-02-21 | Discharge: 2019-02-21 | Disposition: A | Discharge status: Home / Self Care | Payer: Medicaid Other | Source: Ambulatory Visit | Attending: Radiation Oncology | Admitting: Radiation Oncology

## 2019-02-21 ENCOUNTER — Other Ambulatory Visit: Payer: Self-pay

## 2019-02-21 DIAGNOSIS — M797 Fibromyalgia: Secondary | ICD-10-CM | POA: Diagnosis not present

## 2019-02-21 DIAGNOSIS — Z170 Estrogen receptor positive status [ER+]: Secondary | ICD-10-CM | POA: Insufficient documentation

## 2019-02-21 DIAGNOSIS — Z510 Encounter for antineoplastic radiation therapy: Secondary | ICD-10-CM | POA: Insufficient documentation

## 2019-02-21 DIAGNOSIS — F319 Bipolar disorder, unspecified: Secondary | ICD-10-CM | POA: Diagnosis not present

## 2019-02-21 DIAGNOSIS — C50312 Malignant neoplasm of lower-inner quadrant of left female breast: Secondary | ICD-10-CM | POA: Insufficient documentation

## 2019-02-21 DIAGNOSIS — F4310 Post-traumatic stress disorder, unspecified: Secondary | ICD-10-CM | POA: Insufficient documentation

## 2019-02-21 DIAGNOSIS — Z79899 Other long term (current) drug therapy: Secondary | ICD-10-CM | POA: Diagnosis not present

## 2019-02-21 DIAGNOSIS — F17210 Nicotine dependence, cigarettes, uncomplicated: Secondary | ICD-10-CM | POA: Insufficient documentation

## 2019-02-21 DIAGNOSIS — C770 Secondary and unspecified malignant neoplasm of lymph nodes of head, face and neck: Secondary | ICD-10-CM

## 2019-02-28 ENCOUNTER — Inpatient Hospital Stay: Payer: Medicaid Other

## 2019-02-28 ENCOUNTER — Inpatient Hospital Stay (HOSPITAL_BASED_OUTPATIENT_CLINIC_OR_DEPARTMENT_OTHER): Payer: Medicaid Other | Admitting: Adult Health

## 2019-02-28 ENCOUNTER — Other Ambulatory Visit: Payer: Self-pay

## 2019-02-28 ENCOUNTER — Encounter: Payer: Self-pay | Admitting: Adult Health

## 2019-02-28 VITALS — BP 102/78 | HR 85 | Temp 97.7°F | Resp 17 | Ht 65.0 in | Wt 167.0 lb

## 2019-02-28 DIAGNOSIS — Z5112 Encounter for antineoplastic immunotherapy: Secondary | ICD-10-CM | POA: Diagnosis not present

## 2019-02-28 DIAGNOSIS — C7951 Secondary malignant neoplasm of bone: Secondary | ICD-10-CM | POA: Diagnosis not present

## 2019-02-28 DIAGNOSIS — C50919 Malignant neoplasm of unspecified site of unspecified female breast: Secondary | ICD-10-CM

## 2019-02-28 DIAGNOSIS — C7931 Secondary malignant neoplasm of brain: Secondary | ICD-10-CM | POA: Diagnosis not present

## 2019-02-28 DIAGNOSIS — Z510 Encounter for antineoplastic radiation therapy: Secondary | ICD-10-CM | POA: Diagnosis not present

## 2019-02-28 DIAGNOSIS — C50312 Malignant neoplasm of lower-inner quadrant of left female breast: Secondary | ICD-10-CM

## 2019-02-28 DIAGNOSIS — Z9012 Acquired absence of left breast and nipple: Secondary | ICD-10-CM

## 2019-02-28 DIAGNOSIS — F17210 Nicotine dependence, cigarettes, uncomplicated: Secondary | ICD-10-CM

## 2019-02-28 DIAGNOSIS — F319 Bipolar disorder, unspecified: Secondary | ICD-10-CM | POA: Diagnosis not present

## 2019-02-28 DIAGNOSIS — Z170 Estrogen receptor positive status [ER+]: Secondary | ICD-10-CM

## 2019-02-28 DIAGNOSIS — Z801 Family history of malignant neoplasm of trachea, bronchus and lung: Secondary | ICD-10-CM

## 2019-02-28 LAB — COMPREHENSIVE METABOLIC PANEL
ALT: 21 U/L (ref 0–44)
AST: 22 U/L (ref 15–41)
Albumin: 3.8 g/dL (ref 3.5–5.0)
Alkaline Phosphatase: 56 U/L (ref 38–126)
Anion gap: 10 (ref 5–15)
BUN: 11 mg/dL (ref 6–20)
CO2: 26 mmol/L (ref 22–32)
Calcium: 9.4 mg/dL (ref 8.9–10.3)
Chloride: 103 mmol/L (ref 98–111)
Creatinine, Ser: 1.06 mg/dL — ABNORMAL HIGH (ref 0.44–1.00)
GFR calc Af Amer: >60 mL/min (ref >60)
GFR calc non Af Amer: 58 mL/min — ABNORMAL LOW (ref >60)
Glucose, Bld: 105 mg/dL — ABNORMAL HIGH (ref 70–99)
Potassium: 4 mmol/L (ref 3.5–5.1)
Sodium: 139 mmol/L (ref 135–145)
Total Bilirubin: 0.3 mg/dL (ref 0.3–1.2)
Total Protein: 6.8 g/dL (ref 6.5–8.1)

## 2019-02-28 LAB — CBC WITH DIFFERENTIAL/PLATELET
Abs Immature Granulocytes: 0.01 10*3/uL (ref 0.00–0.07)
Basophils Absolute: 0 10*3/uL (ref 0.0–0.1)
Basophils Relative: 0 %
Eosinophils Absolute: 0 10*3/uL (ref 0.0–0.5)
Eosinophils Relative: 0 %
HCT: 38.2 % (ref 36.0–46.0)
Hemoglobin: 12.4 g/dL (ref 12.0–15.0)
Immature Granulocytes: 0 %
Lymphocytes Relative: 24 %
Lymphs Abs: 0.8 10*3/uL (ref 0.7–4.0)
MCH: 29.9 pg (ref 26.0–34.0)
MCHC: 32.5 g/dL (ref 30.0–36.0)
MCV: 92 fL (ref 80.0–100.0)
Monocytes Absolute: 0.3 10*3/uL (ref 0.1–1.0)
Monocytes Relative: 9 %
Neutro Abs: 2.2 10*3/uL (ref 1.7–7.7)
Neutrophils Relative %: 67 %
Platelets: 146 10*3/uL — ABNORMAL LOW (ref 150–400)
RBC: 4.15 MIL/uL (ref 3.87–5.11)
RDW: 12.7 % (ref 11.5–15.5)
WBC: 3.3 10*3/uL — ABNORMAL LOW (ref 4.0–10.5)
nRBC: 0 % (ref 0.0–0.2)

## 2019-02-28 MED ORDER — ACETAMINOPHEN 325 MG PO TABS: 650.0000 mg | ORAL_TABLET | Freq: Once | ORAL | Status: DC

## 2019-02-28 MED ORDER — DIPHENHYDRAMINE HCL 25 MG PO CAPS: 25.0000 mg | ORAL_CAPSULE | Freq: Once | ORAL | Status: DC

## 2019-02-28 MED ORDER — ACETAMINOPHEN 325 MG PO TABS
ORAL_TABLET | ORAL | Status: AC
  Filled 2019-02-28: qty 2

## 2019-02-28 MED ORDER — DIPHENHYDRAMINE HCL 25 MG PO CAPS
ORAL_CAPSULE | ORAL | Status: AC
  Filled 2019-02-28: qty 1

## 2019-02-28 MED ORDER — SODIUM CHLORIDE 0.9 % IV SOLN
3.4000 mg/kg | Freq: Once | INTRAVENOUS | Status: AC
  Administered 2019-02-28: 260 mg via INTRAVENOUS
  Filled 2019-02-28: qty 8

## 2019-02-28 MED ORDER — HEPARIN SOD (PORK) LOCK FLUSH 100 UNIT/ML IV SOLN
500.0000 [IU] | Freq: Once | INTRAVENOUS | Status: AC | PRN
  Administered 2019-02-28: 500 [IU]
  Filled 2019-02-28: qty 5

## 2019-02-28 MED ORDER — SODIUM CHLORIDE 0.9% FLUSH
10.0000 mL | INTRAVENOUS | Status: DC | PRN
  Administered 2019-02-28: 10 mL
  Filled 2019-02-28: qty 10

## 2019-02-28 MED ORDER — SODIUM CHLORIDE 0.9 % IV SOLN
Freq: Once | INTRAVENOUS | Status: AC
  Administered 2019-02-28: 09:00:00 via INTRAVENOUS
  Filled 2019-02-28: qty 250

## 2019-02-28 NOTE — Patient Instructions (Signed)
Ado-Trastuzumab Emtansine for injection What is this medicine? ADO-TRASTUZUMAB EMTANSINE (ADD oh traz TOO zuh mab em TAN zine) is a monoclonal antibody combined with chemotherapy. It is used to treat breast cancer. This medicine may be used for other purposes; ask your health care provider or pharmacist if you have questions. COMMON BRAND NAME(S): Kadcyla What should I tell my health care provider before I take this medicine? They need to know if you have any of these conditions: -heart disease -heart failure -infection (especially a virus infection such as chickenpox, cold sores, or herpes) -liver disease -lung or breathing disease, like asthma -an unusual or allergic reaction to ado-trastuzumab emtansine, other medications, foods, dyes, or preservatives -pregnant or trying to get pregnant -breast-feeding How should I use this medicine? This medicine is for infusion into a vein. It is given by a health care professional in a hospital or clinic setting. Talk to your pediatrician regarding the use of this medicine in children. Special care may be needed. Overdosage: If you think you have taken too much of this medicine contact a poison control center or emergency room at once. NOTE: This medicine is only for you. Do not share this medicine with others. What if I miss a dose? It is important not to miss your dose. Call your doctor or health care professional if you are unable to keep an appointment. What may interact with this medicine? This medicine may also interact with the following medications: -atazanavir -boceprevir -clarithromycin -delavirdine -indinavir -dalfopristin; quinupristin -isoniazid, INH -itraconazole -ketoconazole -nefazodone -nelfinavir -ritonavir -telaprevir -telithromycin -tipranavir -voriconazole This list may not describe all possible interactions. Give your health care provider a list of all the medicines, herbs, non-prescription drugs, or dietary  supplements you use. Also tell them if you smoke, drink alcohol, or use illegal drugs. Some items may interact with your medicine. What should I watch for while using this medicine? Visit your doctor for checks on your progress. This drug may make you feel generally unwell. This is not uncommon, as chemotherapy can affect healthy cells as well as cancer cells. Report any side effects. Continue your course of treatment even though you feel ill unless your doctor tells you to stop. You may need blood work done while you are taking this medicine. Call your doctor or health care professional for advice if you get a fever, chills or sore throat, or other symptoms of a cold or flu. Do not treat yourself. This drug decreases your body's ability to fight infections. Try to avoid being around people who are sick. Be careful brushing and flossing your teeth or using a toothpick because you may get an infection or bleed more easily. If you have any dental work done, tell your dentist you are receiving this medicine. Avoid taking products that contain aspirin, acetaminophen, ibuprofen, naproxen, or ketoprofen unless instructed by your doctor. These medicines may hide a fever. Do not become pregnant while taking this medicine or for 7 months after stopping it, men with female partners should use contraception during treatment and for 4 months after the last dose. Women should inform their doctor if they wish to become pregnant or think they might be pregnant. There is a potential for serious side effects to an unborn child. Do not breast-feed an infant while taking this medicine or for 7 months after the last dose. Men who have a partner who is pregnant or who is capable of becoming pregnant should use a condom during sexual activity while taking this medicine and for   4 months after stopping it. Men should inform their doctors if they wish to father a child. This medicine may lower sperm counts. Talk to your health care  professional or pharmacist for more information. What side effects may I notice from receiving this medicine? Side effects that you should report to your doctor or health care professional as soon as possible: -allergic reactions like skin rash, itching or hives, swelling of the face, lips, or tongue -breathing problems -chest pain or palpitations -fever or chills, sore throat -general ill feeling or flu-like symptoms -light-colored stools -nausea, vomiting -pain, tingling, numbness in the hands or feet -signs and symptoms of bleeding such as bloody or black, tarry stools; red or dark-brown urine; spitting up blood or brown material that looks like coffee grounds; red spots on the skin; unusual bruising or bleeding from the eye, gums, or nose -swelling of the legs or ankles -yellowing of the eyes or skin Side effects that usually do not require medical attention (report to your doctor or health care professional if they continue or are bothersome): -changes in taste -constipation -dizziness -headache -joint pain -muscle pain -trouble sleeping -unusually weak or tired This list may not describe all possible side effects. Call your doctor for medical advice about side effects. You may report side effects to FDA at 1-800-FDA-1088. Where should I keep my medicine? This drug is given in a hospital or clinic and will not be stored at home. NOTE: This sheet is a summary. It may not cover all possible information. If you have questions about this medicine, talk to your doctor, pharmacist, or health care provider.  2019 Elsevier/Gold Standard (2016-01-06 12:11:06)  

## 2019-02-28 NOTE — Progress Notes (Signed)
[facility]  [phone] [phone]) [phone]     ID: [name] DOB: 12-31-1960  MR#: [id number]  SAY#:[id number]  Patient Care Team: Charlott Rakes, MD as PCP - General (Family Medicine) Kyung Rudd, MD as Consulting Physician (Radiation Oncology) Erline Levine, MD as Consulting Physician (Neurosurgery) Corena Pilgrim, MD as Consulting Physician (Psychiatry) Mickeal Skinner Acey Lav, MD as Consulting Physician (Psychiatry) Nehemiah Settle, MD as Referring Physician (Plastic Surgery) Irene Limbo, MD as Consulting Physician (Plastic Surgery) Dillingham, Loel Lofty, DO as Attending Physician (Plastic Surgery)   CHIEF COMPLAINT: Metastatic triple positive breast cancer  CURRENT TREATMENT: Trastuzumab, denosumab, anastrozole  INTERVAL HISTORY: [name] returns today for follow-up and treatment of her metastatic triple positive breast cancer.   She cwas recently started on TDM-1 for likely progression in her paratracheal node.  She started this on 01/31/2019.  She tolerated this treatment well.    She also continues on denosumab. Which she is tolerating well.    REVIEW OF SYSTEMS: Amand is doing well today.  She is slightly fatigued.  She has noted an increase in her allergies, and has some clear nasal drianage.  She is working outside [place] yards.  She continues to live at a transitional home.  She denies any fever, chills, cough, shortness of breath, chest pain, or palpitations.  She is without nausea, vomiting, bowel/bladder changes.  A detailed ROS was otherwise non contributory.    BREAST CANCER HISTORY: From the original intake note:  [name] was aware of a "lemon sized lump in" her left axilla for about a year before bringing it to medical attention. By then she had developed left breast and left axillary swelling (June 2016). She presented to the local emergency room and had a chest CT scan 06/06/2015 which showed a nodule in the left breast measuring 0.9 cm  and questionable left axillary adenopathy. She then proceeded to bilateral diagnostic mammography and left breast ultrasonography 06/19/2015. There were no prior films for comparison (last mammography 12 years prior).. The breast density was category C. Mammography showed in the left breast upper inner quadrant a 7 cm area including a small mass and significant pleomorphic calcifications. Ultrasonography defined the mass as measuring 1.2 cm. The left axilla appeared unremarkable. There was significant skin edema.  Biopsy of the left breast mass 06/19/2015 showed (SP [id number]) an invasive ductal carcinoma, grade 2, estrogen receptor 83% positive, progesterone receptor 26% positive, and HER-2 amplified by immunohistochemstry with a 3+ reading. The patient had biopsies of a separate area in the left breast August of the same year and this showed atypical ductal hyperplasia. (SP [id number]).  Accordingly after appropriate discussion on 08/21/2015 the patient proceeded to left mastectomy with left axillary sentinel lymph node sampling, which, since the lymph nodes were positive, extended to the procedure to left axillary lymph node dissection. The pathology (SP [id number]) showed an invasive ductal carcinoma, grade 3, measuring in excess of 9 cm. There were also skin satellites, not contiguous with the invasive carcinoma. Margins were clear and ample. There was evidence of lymphovascular invasion. A total of 15 lymph nodes were removed, including 5 sentinel lymph nodes, all of which were positive, so that the final total was 14 out of 15 lymph nodes involved by tumor. There was evidence of extranodal extension. The final pathology was pT4b pN2a, stage IIIB  CA-27-29 and CEA 09/19/2015 were non-informative October 2016.  Unfortunately CT scans of the chest abdomen and pelvis 09/16/2015 showed bony metastases to the ri/ght scapula, left iliac crest,  and also L4 and T-spine. There were questionable liver cysts which on  repeat CT scan 03/02/2016 appear to be a little bit more well-defined, possibly a little larger. There were also some possible right upper lobe lung lesions.  Adjuvant treatment consisted of docetaxel, trastuzumab and pertuzumab, with the final (6th) docetaxel dose given 02/11/2016. She continues on trastuzumab and pertuzumab, with the 11th cycle given 05/05/2016. Echocardiogram 02/26/2016 showed an ejection fraction of 55%. She receives denosumab/Xgeva every 4 weeks.. She also receives radiation, started 06/09/201, to be completed 06/26/2016.  Her subsequent history is as detailed below   PAST MEDICAL HISTORY: Past Medical History:  Diagnosis Date   Alcohol abuse    Anemia    during chemo   Anxiety    At age 22   Arthritis Dx 2010   Bipolar disorder ([place])    Bronchitis    Cancer ([place])    breast mets to brain   Chronic pain    Complication of anesthesia    Depression    Family history of adverse reaction to anesthesia    MOther had PONV   Fibromyalgia Dx 2005   GERD (gastroesophageal reflux disease)    Headache    hx  migraines   Lymphedema of left arm    Opiate dependence (HCC)    PONV (postoperative nausea and vomiting)    Port-A-Cath in place    PTSD (post-traumatic stress disorder)      PAST SURGICAL HISTORY: Past Surgical History:  Procedure Laterality Date   APPLICATION OF CRANIAL NAVIGATION N/A 08/14/2016   Procedure: APPLICATION OF CRANIAL NAVIGATION;  Surgeon: Erline Levine, MD;  Location: [facility];  Service: Neurosurgery;  Laterality: N/A;   BREAST RECONSTRUCTION Left    with silicone implant   COLONOSCOPY W/ POLYPECTOMY     CRANIOTOMY N/A 08/14/2016   Procedure: CRANIOTOMY TUMOR EXCISION WITH Lucky Rathke;  Surgeon: Erline Levine, MD;  Location: [facility];  Service: Neurosurgery;  Laterality: N/A;   FIBULA FRACTURE SURGERY Left    MASTECTOMY Left    RADIOLOGY WITH ANESTHESIA N/A 07/23/2016   Procedure: MRI OF BRAIN WITH AND WITHOUT;   Surgeon: Medication Radiologist, MD;  Location: [facility];  Service: Radiology;  Laterality: N/A;   RADIOLOGY WITH ANESTHESIA N/A 09/08/2016   Procedure: MRI OF BRAIN WITH AND WITHOUT CONTRAST;  Surgeon: Medication Radiologist, MD;  Location: [facility];  Service: Radiology;  Laterality: N/A;   RADIOLOGY WITH ANESTHESIA N/A 12/10/2016   Procedure: MRI OF BRAIN WITH AND WITHOUT;  Surgeon: Medication Radiologist, MD;  Location: [facility];  Service: Radiology;  Laterality: N/A;   RADIOLOGY WITH ANESTHESIA N/A 03/02/2017   Procedure: MRI of BRAIN W and W/OUT CONTRAST;  Surgeon: Medication Radiologist, MD;  Location: [facility];  Service: Radiology;  Laterality: N/A;   RADIOLOGY WITH ANESTHESIA N/A 07/29/2017   Procedure: RADIOLOGY WITH ANESTHESIA MRI OF BRAIN WITH AND WITHOUT CONTRAST;  Surgeon: Radiologist, Medication, MD;  Location: [facility];  Service: Radiology;  Laterality: N/A;   RADIOLOGY WITH ANESTHESIA N/A 12/07/2017   Procedure: MRI WITH ANESTHESIA OF BRAIN WITH AND WITHOUT CONTRAST;  Surgeon: Radiologist, Medication, MD;  Location: [facility];  Service: Radiology;  Laterality: N/A;   RADIOLOGY WITH ANESTHESIA N/A 04/07/2018   Procedure: MRI OF BRAIN WITH AND WITHOUT CONTRAST;  Surgeon: Radiologist, Medication, MD;  Location: [facility];  Service: Radiology;  Laterality: N/A;   RADIOLOGY WITH ANESTHESIA N/A 08/23/2018   Procedure: MRI WITH ANESTHESIA OF THE BRAIN WITH AND WITHOUT;  Surgeon: Radiologist, Medication, MD;  Location: [facility];  Service: Radiology;  Laterality: N/A;   RADIOLOGY WITH ANESTHESIA N/A 01/24/2019   Procedure: MRI OF BRAIN WITH AND WITHOUT CONTRAST;  Surgeon: Radiologist, Medication, MD;  Location: [facility];  Service: Radiology;  Laterality: N/A;   right power port placement Right      FAMILY HISTORY Family History  Problem Relation Age of Onset   Diabetes Mother    Bipolar disorder Mother    CAD Father    The patient's father still living, age 29, in [place]. He had prostate cancer at  some point in the past. The patient's mother died at age 44 from complications of diabetes. The patient had no brothers, 2 sisters. A paternal grandmother had lung cancer in the setting of tobacco abuse. There is no other history of cancer in the family to her knowledge  GYNECOLOGIC HISTORY:  No LMP recorded. Patient has had an ablation. Menarche approximately age 13. First live birth in [birth year]. The patient is GX P2. She underwent endometrial ablation in 2016.  SOCIAL HISTORY: Updated August 2019 The patient is originally from [place]. She has lived in [place] before but more recently was in [place]. She is back here because she could not afford her rent in [place]. She is living here and a temporary situation. She is divorced. Her 2 children are [name] who lives in [place] and works as a Development worker, community, and [name] who also lives in [place] and works as a Catering manager. The patient has a grandchild, Arelia Longest, 90 years old as of July 2019, living in [place] with his mother. The patient also has a grandson born in February 2019, who also lives in [place]. The patient has not established herself with a local church yet.    ADVANCED DIRECTIVES: Not in place; at the 06/03/2016 visit the patient was given the appropriate forms to complete and notarize at her discretion   HEALTH MAINTENANCE: Social History   Tobacco Use   Smoking status: Current Every Day Smoker    Packs/day: 0.50    Years: 43.00    Pack years: 21.50    Types: Cigarettes   Smokeless tobacco: Never Used  Substance Use Topics   Alcohol use: No    Comment: no ETOH since 08/22/12   Drug use: No    Comment: states she's in recovery program for 5 years     Colonoscopy:  PAP:  Bone density:   Allergies  Allergen Reactions   Demerol [Meperidine Hcl] Itching and Nausea And Vomiting   Erythromycin Rash    Current Outpatient Medications on File Prior to Visit  Medication Sig Dispense Refill   anastrozole  (ARIMIDEX) 1 MG tablet TAKE 1 TABLET BY MOUTH EVERY DAY 90 tablet 1   Biotin 1000 MCG tablet Take 1,000 mcg by mouth daily.     Cholecalciferol (VITAMIN D3) 5000 units CAPS Take 5,000 Units by mouth daily.     cyclobenzaprine (FLEXERIL) 10 MG tablet Take 1 tablet (10 mg total) by mouth 2 (two) times daily. 60 tablet 3   diazepam (VALIUM) 5 MG tablet Take 1 tab 1 hour prior to scan and may repeat if needed 2 tablet 0   docusate sodium (COLACE) 100 MG capsule Take 100 mg by mouth daily as needed for mild constipation.      fluticasone (FLONASE) 50 MCG/ACT nasal spray Place 2 sprays into both nostrils daily. 16 g 4   gabapentin (NEURONTIN) 300 MG capsule Take 1 capsule (300 mg total) by mouth 2 (two) times daily. (Patient  taking differently: Take 300-600 mg by mouth See admin instructions. Take 300 mg by mouth in the morning and take 600 mg by mouth at bedtime) 60 capsule 3   ibuprofen (ADVIL,MOTRIN) 800 MG tablet TAKE 1 TABLET BY MOUTH THREE TIMES A DAY 90 tablet 0   lamoTRIgine (LAMICTAL) 100 MG tablet Take 100 mg by mouth every morning.     loratadine (CLARITIN) 10 MG tablet Take 1 tablet (10 mg total) by mouth daily. 30 tablet 6   Lurasidone HCl (LATUDA) 60 MG TABS Take 60 mg by mouth daily.     nicotine polacrilex (NICOTINE MINI) 4 MG lozenge Take 1 lozenge (4 mg total) by mouth as needed for smoking cessation. (Patient not taking: Reported on 02/02/2019) 100 tablet 0   ondansetron (ZOFRAN) 8 MG tablet TAKE 1 TABLET (8 MG TOTAL) BY MOUTH EVERY 8 (EIGHT) HOURS AS NEEDED FOR NAUSEA OR VOMITING. 90 tablet 1   pantoprazole (PROTONIX) 40 MG tablet Take 1 tablet (40 mg total) by mouth daily. 30 tablet 2   polyethylene glycol (MIRALAX / GLYCOLAX) packet TAKE 17 G BY MOUTH DAILY AS NEEDED FOR MILD CONSTIPATION. 30 packet 0   traZODone (DESYREL) 100 MG tablet Take 1 tablet (100 mg total) by mouth at bedtime. (Patient taking differently: Take 300 mg by mouth at bedtime. ) 30 tablet 0    varenicline (CHANTIX) 0.5 MG tablet Take 0.5 mg by mouth daily.     Current Facility-Administered Medications on File Prior to Visit  Medication Dose Route Frequency Provider Last Rate Last Dose   acetaminophen (TYLENOL) tablet 650 mg  650 mg Oral Once Magrinat, Virgie Dad, MD       diphenhydrAMINE (BENADRYL) capsule 25 mg  25 mg Oral Once Magrinat, Virgie Dad, MD       heparin lock flush 100 unit/mL  500 Units Intracatheter Once PRN Magrinat, Virgie Dad, MD       sodium chloride flush (NS) 0.9 % injection 10 mL  10 mL Intracatheter PRN Magrinat, Virgie Dad, MD         OBJECTIVE:  Vitals:   02/28/19 0839  BP: 102/78  Pulse: 85  Resp: 17  Temp: 97.7 F (36.5 C)  TempSrc: Oral  SpO2: 97%  Weight: 167 lb (75.8 kg)  Height: 5' 5"  (1.651 m)  Body mass index is 27.79 kg/m.  ECOG FS: 1 - Symptomatic but completely ambulatory  GENERAL: Patient is a well appearing female in no acute distress HEENT:  Sclerae anicteric.  Oropharynx clear and moist. No ulcerations or evidence of oropharyngeal candidiasis. Neck is supple.  NODES:  No cervical, supraclavicular, or axillary lymphadenopathy palpated.  BREAST EXAM:  Deferred. LUNGS:  Clear to auscultation bilaterally.  No wheezes or rhonchi. HEART:  Regular rate and rhythm. No murmur appreciated. ABDOMEN:  Soft, nontender.  Positive, normoactive bowel sounds. No organomegaly palpated. MSK:  No focal spinal tenderness to palpation. Full range of motion bilaterally in the upper extremities. EXTREMITIES:  Slight left arm edema SKIN:  Clear with no obvious rashes or skin changes. No nail dyscrasia. NEURO:  Nonfocal. Well oriented.  Appropriate affect.      LAB RESULTS: No results found for: LABCA2  CBC    Component Value Date/Time   WBC 3.3 (L) 02/28/2019 0808   RBC 4.15 02/28/2019 0808   HGB 12.4 02/28/2019 0808   HGB 12.1 10/26/2017 1038   HCT 38.2 02/28/2019 0808   HCT 35.7 10/26/2017 1038   PLT 146 (L) 02/28/2019 0808   PLT  167  10/26/2017 1038   MCV 92.0 02/28/2019 0808   MCV 98.4 10/26/2017 1038   MCH 29.9 02/28/2019 0808   MCHC 32.5 02/28/2019 0808   RDW 12.7 02/28/2019 0808   RDW 13.3 10/26/2017 1038   LYMPHSABS 0.8 02/28/2019 0808   LYMPHSABS 0.5 (L) 10/26/2017 1038   MONOABS 0.3 02/28/2019 0808   MONOABS 0.1 10/26/2017 1038   EOSABS 0.0 02/28/2019 0808   EOSABS 0.0 10/26/2017 1038   BASOSABS 0.0 02/28/2019 0808   BASOSABS 0.0 10/26/2017 1038   CMP Latest Ref Rng & Units 02/28/2019 01/31/2019 01/25/2019  Glucose 70 - 99 mg/dL 105(H) 125(H) 95  BUN 6 - 20 mg/dL 11 13 11   Creatinine 0.44 - 1.00 mg/dL 1.06(H) 1.05(H) 0.95  Sodium 135 - 145 mmol/L 139 143 140  Potassium 3.5 - 5.1 mmol/L 4.0 4.0 3.5  Chloride 98 - 111 mmol/L 103 104 101  CO2 22 - 32 mmol/L 26 25 30   Calcium 8.9 - 10.3 mg/dL 9.4 9.7 10.7(H)  Total Protein 6.5 - 8.1 g/dL 6.8 7.2 7.1  Total Bilirubin 0.3 - 1.2 mg/dL 0.3 0.2(L) 0.2(L)  Alkaline Phos 38 - 126 U/L 56 58 57  AST 15 - 41 U/L 22 17 16   ALT 0 - 44 U/L 21 14 12      STUDIES: No results found.  ELIGIBLE FOR AVAILABLE RESEARCH PROTOCOL: no  ASSESSMENT: 58 y.o. [place] woman with stage IV left-sided breast cancer involving bone and central nervous system  (1) s/p left breast lower inner quadrant biopsy 06/19/2015 for a clinical T2-3 NX invasive ductal carcinoma, grade 2, triple positive.  (2) status post left mastectomy and axillary lymph node dissection  with immediate expander placement 07/18/2015 for an mpT4 pN2,stage IIIB invasive ductal carcinoma, grade 3, with negative margins.  (a) definitive implant exchange to be scheduled in December   METASTATIC DISEASE: October 2016  (3) CT scan of the chest abdomen and pelvis  09/16/2015 shows metastatic lesions in the right scapula, left iliac crest, L4, and T spine. There were questionable liver cysts, with repeat CT scan 03/02/2016 showing possible right upper lobe lung lesions and possibly increased liver lesions  (a) CT scan  of the chest 06/17/2016 shows no active disease in the lungs or liver  (b) Bone scan July 2017 showed no evidence of bony metastatic disease   (c) head CT 07/08/2016 showed a cerebellar lesion, confirmed by MRI 07/23/2016, status post craniotomy 08/14/2016, confirming a metastatic deposit which was estrogen and progesterone receptor negative, HER-2 amplified with a signals ratio of 7.16, number per cell 13.25  (d) CA 27-29 is not informative  (4) received docetaxel every 3 weeks 6 together with trastuzumab and pertuzumab, last docetaxel dose 02/11/2016  (5) adjuvant radiation7/03/2016 to 06/26/2016 at [facility]: 1. The Left chest wall was treated to 23.4 Gy in 13 fractions at 1.8 Gy per fraction. 2. The Left chest wall was boosted to 10 Gy in 5 fractions at 2 Gy per fraction. 3. The Left Sclav/PAB was treated to 23.4 Gy in 13 fractions at 1.8 Gy per fraction.  [Note: Including the patient's treatment in [place] (received 15 fractions per Dr. Maryan Rued near [place], [place]), the patient received 50.4 Gy to the left chest wall and supraclavicular region. ]  (6) started trastuzumab and pertuzumab October 2016, continuing every 3 weeks,  (a) echocardiogram 02/26/2016 showed a well preserved ejection fraction  (b) echocardiogram 07/01/2016 shows an ejection fraction in the 60-65%   (c) pertuzumab discontinued 10/2016 with uncontrolled diarrhea  (  d) echocardiogram 11/11/2016 showed an ejection fraction in the 60-65%  (e) echocardiogram 03/03/2017 shows an ejection fraction of 60-65%  (f) echocardiogram on 05/19/2017 shows an ejection fraction of 55-60%  (g) echocardiogram 09/24/2017 shows the ejection fraction in the 60-65%  (h) echocardiogram 02/14/2018 shows an ejection fraction in the 60-65%  (I) echocardiogram  06/30/2018 shows an ejection fraction in the 55-60%  (m) echocardiogram on 12/08/2018 shows an ejection fraction in the 60-65% range      (7) started denosumab/Xgeva October 2017 given  every 4 weeks, transitioned to every 8 weeks beginning 10/11/18 (every 6 weeks while giving TDM1 every 3 weeks)  (8) started anastrozole October 2017   (a) bone scan 11/10/2016 shows no active disease  (b) chest CT scan 11/10/2016 stable, with no evidence of active disease  (c) chest CT and bone scan 07/02/2017 show no evidence of active disease  (d) CT scan of the chest with contrast 11/10/2017 shows some left axillary edema, but no evidence of thrombosis or adenopathy in that area, 0.9 cm precarinal lymph and 0.7 cm right upper lobe nodule node; bone lesions stable  (e) CT of the chest 05/04/2018 shows a 1.4 cm right lower paratracheal node which is slightly increased and a new right prevascular mediastinal node measuring 0.7 cm.  Bone lesions are stable.  (f) chest CT on 07/01/2018 shows no definite findings of metastatic disease in the thorax. Previously noted borderline enlarged low right paratracheal lymph node is stable to slightly decreased in size   (e) chest CT on 12/30/2018 notes mild increase in right paratracheal adneopathy, recommended PET scan.  Pet scan on 01/19/2019 showed a hypermetabolic and enlarged right paratracheal lymph node consistent with breast cancer recurrence.  (f)Trastuzumab discontinued due to scans below, TDM-1 to start on 01/31/2019.  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  (a) brain irradiation10/19/17-11/08/17: Whole brain/ 35 Gy in 14 fractions   (b) repeat brain MRI obtained 12/10/2016 shows no active disease in the brain  (c) repeat brain MRI 03/02/2017 shows no evidence of residual or recurrent  disease  (d) repeat brain MRI 07/29/2017 shows no evidence of residual or recurrent disease  (e) repeat brain MRI 12/07/2017 shows no evidence of disease recurrence.  There is progressive white matter change secondary to prior treatment.  (f) repeat brain MRI 04/07/2018 showed no evidence of disease\  (g) repeat brain MRI on 08/23/2018 shows no evidence of disease  (h) repeat brain MRI on 01/25/2019 shows no evidence of disease   PLAN: Caro is doing well today.  Her CBC is stable and I reviewed it with her in detail.  Her CMET is pending.  She is tolerating the TDM1 well and will continue this.  She has no clinical signs of progression.  She will start radiation next week.  I reviewed her TDM1 schedule with Dr. [name].  He would like for her to receive it every 3 weeks instead of every 4 weeks for the first 6-8 treatments.  Then he will likely transition it back to every 4 weeks.    She will continue to receive Xgeva.  While she is receiving the TDM1 every  3 weeks, she will receive the xgeva every 6 weeks.  I have updated her orders accordingly.    We reviewed recommendations regarding social distancing and isolation during the COVID-19 pandemic.  I reviewed that she is at increased risk for potential complications should she get the virus due to her health history and co morbidities.   Dawnn will return in 3 weeks for labs and TDM1, and in 6 weeks for labs, f/u with myself or Dr. [name], and [place].  She knows to call for any other issues that may develop before the next visit.  A total of (20) minutes of face-to-face time was spent with this patient with greater than 50% of that time in counseling and care-coordination.   Wilber Bihari, NP 02/28/19 10:53 AM Medical Oncology and Hematology [facility] [address] [place], [place] [address]

## 2019-03-01 LAB — CANCER ANTIGEN 27.29: CA 27.29: 7.1 U/mL (ref 0.0–38.6)

## 2019-03-02 ENCOUNTER — Telehealth: Payer: Self-pay | Admitting: Adult Health

## 2019-03-02 NOTE — Telephone Encounter (Signed)
Tried to reach regarding 4/21 I did leave a message

## 2019-03-03 ENCOUNTER — Other Ambulatory Visit: Payer: Self-pay | Admitting: Oncology

## 2019-03-07 ENCOUNTER — Ambulatory Visit
Admission: RE | Admit: 2019-03-07 | Discharge: 2019-03-07 | Disposition: A | Discharge status: Home / Self Care | Payer: Medicaid Other | Source: Ambulatory Visit | Attending: Radiation Oncology | Admitting: Radiation Oncology

## 2019-03-07 ENCOUNTER — Other Ambulatory Visit: Payer: Self-pay

## 2019-03-07 ENCOUNTER — Ambulatory Visit: Payer: Medicaid Other | Admitting: Radiation Oncology

## 2019-03-07 DIAGNOSIS — F4310 Post-traumatic stress disorder, unspecified: Secondary | ICD-10-CM | POA: Diagnosis not present

## 2019-03-07 DIAGNOSIS — Z79899 Other long term (current) drug therapy: Secondary | ICD-10-CM | POA: Diagnosis not present

## 2019-03-07 DIAGNOSIS — F319 Bipolar disorder, unspecified: Secondary | ICD-10-CM | POA: Insufficient documentation

## 2019-03-07 DIAGNOSIS — Z510 Encounter for antineoplastic radiation therapy: Secondary | ICD-10-CM | POA: Insufficient documentation

## 2019-03-07 DIAGNOSIS — C50312 Malignant neoplasm of lower-inner quadrant of left female breast: Secondary | ICD-10-CM | POA: Insufficient documentation

## 2019-03-07 DIAGNOSIS — M797 Fibromyalgia: Secondary | ICD-10-CM | POA: Insufficient documentation

## 2019-03-07 DIAGNOSIS — F17210 Nicotine dependence, cigarettes, uncomplicated: Secondary | ICD-10-CM | POA: Insufficient documentation

## 2019-03-07 DIAGNOSIS — Z170 Estrogen receptor positive status [ER+]: Secondary | ICD-10-CM | POA: Diagnosis not present

## 2019-03-08 ENCOUNTER — Telehealth: Payer: Self-pay

## 2019-03-08 ENCOUNTER — Ambulatory Visit: Payer: Medicaid Other

## 2019-03-08 ENCOUNTER — Other Ambulatory Visit: Payer: Self-pay

## 2019-03-08 ENCOUNTER — Ambulatory Visit
Admission: RE | Admit: 2019-03-08 | Discharge: 2019-03-08 | Disposition: A | Discharge status: Home / Self Care | Payer: Medicaid Other | Source: Ambulatory Visit | Attending: Radiation Oncology | Admitting: Radiation Oncology

## 2019-03-08 DIAGNOSIS — Z510 Encounter for antineoplastic radiation therapy: Secondary | ICD-10-CM | POA: Diagnosis not present

## 2019-03-08 MED ORDER — GABAPENTIN 300 MG PO CAPS: 300.0000 mg | ORAL_CAPSULE | ORAL | 0 refills | Status: DC

## 2019-03-08 NOTE — Telephone Encounter (Signed)
Patient requesting refill on Gabapentin.  Originally prescribed by a different doctor, however that clinic is currently closed due to COVID-19 per patient.    Nurse reviewed with MD, okay to provide 30 day supply.  Pt aware.

## 2019-03-09 ENCOUNTER — Ambulatory Visit: Payer: Medicaid Other | Admitting: Radiation Oncology

## 2019-03-09 ENCOUNTER — Ambulatory Visit
Admission: RE | Admit: 2019-03-09 | Discharge: 2019-03-09 | Disposition: A | Discharge status: Home / Self Care | Payer: Medicaid Other | Source: Ambulatory Visit | Attending: Radiation Oncology | Admitting: Radiation Oncology

## 2019-03-09 ENCOUNTER — Other Ambulatory Visit: Payer: Self-pay

## 2019-03-09 DIAGNOSIS — Z510 Encounter for antineoplastic radiation therapy: Secondary | ICD-10-CM | POA: Diagnosis not present

## 2019-03-10 ENCOUNTER — Ambulatory Visit: Payer: Medicaid Other

## 2019-03-10 ENCOUNTER — Ambulatory Visit
Admission: RE | Admit: 2019-03-10 | Discharge: 2019-03-10 | Disposition: A | Discharge status: Home / Self Care | Payer: Medicaid Other | Source: Ambulatory Visit | Attending: Radiation Oncology | Admitting: Radiation Oncology

## 2019-03-10 ENCOUNTER — Other Ambulatory Visit: Payer: Self-pay

## 2019-03-10 DIAGNOSIS — Z510 Encounter for antineoplastic radiation therapy: Secondary | ICD-10-CM | POA: Diagnosis not present

## 2019-03-13 ENCOUNTER — Ambulatory Visit: Payer: Medicaid Other | Admitting: Radiation Oncology

## 2019-03-13 ENCOUNTER — Other Ambulatory Visit: Payer: Self-pay

## 2019-03-13 ENCOUNTER — Ambulatory Visit
Admission: RE | Admit: 2019-03-13 | Discharge: 2019-03-13 | Disposition: A | Discharge status: Home / Self Care | Payer: Medicaid Other | Source: Ambulatory Visit | Attending: Radiation Oncology | Admitting: Radiation Oncology

## 2019-03-13 DIAGNOSIS — Z510 Encounter for antineoplastic radiation therapy: Secondary | ICD-10-CM | POA: Diagnosis not present

## 2019-03-14 ENCOUNTER — Other Ambulatory Visit: Payer: Self-pay

## 2019-03-14 ENCOUNTER — Ambulatory Visit
Admission: RE | Admit: 2019-03-14 | Discharge: 2019-03-14 | Disposition: A | Discharge status: Home / Self Care | Payer: Medicaid Other | Source: Ambulatory Visit | Attending: Radiation Oncology | Admitting: Radiation Oncology

## 2019-03-14 DIAGNOSIS — Z510 Encounter for antineoplastic radiation therapy: Secondary | ICD-10-CM | POA: Diagnosis not present

## 2019-03-15 ENCOUNTER — Ambulatory Visit: Payer: Medicaid Other | Admitting: Radiation Oncology

## 2019-03-15 ENCOUNTER — Other Ambulatory Visit: Payer: Self-pay

## 2019-03-15 ENCOUNTER — Ambulatory Visit
Admission: RE | Admit: 2019-03-15 | Discharge: 2019-03-15 | Disposition: A | Discharge status: Home / Self Care | Payer: Medicaid Other | Source: Ambulatory Visit | Attending: Radiation Oncology | Admitting: Radiation Oncology

## 2019-03-15 DIAGNOSIS — Z510 Encounter for antineoplastic radiation therapy: Secondary | ICD-10-CM | POA: Diagnosis not present

## 2019-03-16 ENCOUNTER — Other Ambulatory Visit: Payer: Self-pay

## 2019-03-16 ENCOUNTER — Ambulatory Visit
Admission: RE | Admit: 2019-03-16 | Discharge: 2019-03-16 | Disposition: A | Discharge status: Home / Self Care | Payer: Medicaid Other | Source: Ambulatory Visit | Attending: Radiation Oncology | Admitting: Radiation Oncology

## 2019-03-16 DIAGNOSIS — Z510 Encounter for antineoplastic radiation therapy: Secondary | ICD-10-CM | POA: Diagnosis not present

## 2019-03-17 ENCOUNTER — Ambulatory Visit: Payer: Medicaid Other | Admitting: Radiation Oncology

## 2019-03-17 ENCOUNTER — Ambulatory Visit
Admission: RE | Admit: 2019-03-17 | Discharge: 2019-03-17 | Disposition: A | Discharge status: Home / Self Care | Payer: Medicaid Other | Source: Ambulatory Visit | Attending: Radiation Oncology | Admitting: Radiation Oncology

## 2019-03-17 ENCOUNTER — Other Ambulatory Visit: Payer: Self-pay

## 2019-03-17 DIAGNOSIS — Z510 Encounter for antineoplastic radiation therapy: Secondary | ICD-10-CM | POA: Diagnosis not present

## 2019-03-20 ENCOUNTER — Encounter: Payer: Self-pay | Admitting: Family Medicine

## 2019-03-20 ENCOUNTER — Encounter: Payer: Self-pay | Admitting: Radiation Oncology

## 2019-03-20 ENCOUNTER — Ambulatory Visit
Admission: RE | Admit: 2019-03-20 | Discharge: 2019-03-20 | Disposition: A | Discharge status: Home / Self Care | Payer: Medicaid Other | Source: Ambulatory Visit | Attending: Radiation Oncology | Admitting: Radiation Oncology

## 2019-03-20 ENCOUNTER — Ambulatory Visit: Payer: Medicaid Other | Attending: Family Medicine | Admitting: Family Medicine

## 2019-03-20 ENCOUNTER — Other Ambulatory Visit: Payer: Self-pay

## 2019-03-20 DIAGNOSIS — J302 Other seasonal allergic rhinitis: Secondary | ICD-10-CM | POA: Diagnosis not present

## 2019-03-20 DIAGNOSIS — C50919 Malignant neoplasm of unspecified site of unspecified female breast: Secondary | ICD-10-CM | POA: Diagnosis not present

## 2019-03-20 DIAGNOSIS — F3130 Bipolar disorder, current episode depressed, mild or moderate severity, unspecified: Secondary | ICD-10-CM

## 2019-03-20 DIAGNOSIS — K219 Gastro-esophageal reflux disease without esophagitis: Secondary | ICD-10-CM | POA: Diagnosis not present

## 2019-03-20 DIAGNOSIS — Z510 Encounter for antineoplastic radiation therapy: Secondary | ICD-10-CM | POA: Diagnosis not present

## 2019-03-20 MED ORDER — FLUTICASONE PROPIONATE 50 MCG/ACT NA SUSP: 2.0000 | Freq: Every day | NASAL | 4 refills | Status: DC

## 2019-03-20 MED ORDER — CYCLOBENZAPRINE HCL 10 MG PO TABS: 10.0000 mg | ORAL_TABLET | Freq: Two times a day (BID) | ORAL | 1 refills | Status: DC

## 2019-03-20 MED ORDER — PANTOPRAZOLE SODIUM 40 MG PO TBEC: 40.0000 mg | DELAYED_RELEASE_TABLET | Freq: Every day | ORAL | 1 refills | Status: DC

## 2019-03-20 NOTE — Progress Notes (Signed)
Patient has been called and DOB has been verified. Patient has been screened and transferred to PCP to start phone visit.     

## 2019-03-20 NOTE — Progress Notes (Signed)
Virtual Visit via Telephone Note  I connected with [name], on 03/20/2019 at 9:26 AM by telephone and verified that I am speaking with the correct person using two identifiers.   Consent: I discussed the limitations, risks, security and privacy concerns of performing an evaluation and management service by telephone and the availability of in person appointments. I also discussed with the patient that there may be a patient responsible charge related to this service. The patient expressed understanding and agreed to proceed.   Location of Patient: Patient's car  Location of Provider: Clinic  Persons participating in Telemedicine visit: Kawthar Ennen Farrington-CMA Dr. Margarita Rana- Physician    History of Present Illness: [name]  is a 58 year old female with a history of tobacco abuse, bipolar disorder, HER -2 positive  pT4b pN2a, M1 stage IIIB-status invasive ductal carcinoma with negative margins, (s/p post left mastectomy and axillary lymph node dissection with expander placement) s/p suboccipital craniectomy for resection of posterior fossa metastasis, s/p radiation to the brain and breast, currently on Anastrozole, Denosumab(Xgeva)  and TDM1  Her PET scan from 01/19/2019 revealed findings below: IMPRESSION: 1. Intensely hypermetabolic and enlarged RIGHT paratracheal lymph node consistent with breast cancer recurrence. 2. No additional sites of metastatic disease on skull base to thigh FDG PET scan. 3. Several sclerotic lesions in the lumbar and thoracic spine without metabolic activity suggest treated skeletal metastasis  She is on her way back from her radiation treatment which happens to be her last and she has an appointment with her medical oncologist tomorrow for another infusion. TDM1 recently started due to likely progression in paratracheal node evident from PET scan above. Last visit with medical oncology was on 02/28/2019.  Overall she feels fine and does  have a baseline fatigue.  She is able to work in [employer] which she does about 2 times a week.  Appetite is good and her mood is stable.  She has been following closely [facility]. She is requesting refill of her chronic medications.  Past Medical History:  Diagnosis Date  . Alcohol abuse   . Anemia    during chemo  . Anxiety    At age 61  . Arthritis Dx 2010  . Bipolar disorder ([place])   . Bronchitis   . Cancer ([place])    breast mets to brain  . Chronic pain   . Complication of anesthesia   . Depression   . Family history of adverse reaction to anesthesia    MOther had PONV  . Fibromyalgia Dx 2005  . GERD (gastroesophageal reflux disease)   . Headache    hx  migraines  . Lymphedema of left arm   . Opiate dependence ([place])   . PONV (postoperative nausea and vomiting)   . Port-A-Cath in place   . PTSD (post-traumatic stress disorder)     Past Surgical History:  Procedure Laterality Date  . APPLICATION OF CRANIAL NAVIGATION N/A 08/14/2016   Procedure: APPLICATION OF CRANIAL NAVIGATION;  Surgeon: Erline Levine, MD;  Location: [facility];  Service: Neurosurgery;  Laterality: N/A;  . BREAST RECONSTRUCTION Left    with silicone implant  . COLONOSCOPY W/ POLYPECTOMY    . CRANIOTOMY N/A 08/14/2016   Procedure: CRANIOTOMY TUMOR EXCISION WITH Lucky Rathke;  Surgeon: Erline Levine, MD;  Location: [facility];  Service: Neurosurgery;  Laterality: N/A;  . FIBULA FRACTURE SURGERY Left   . MASTECTOMY Left   . RADIOLOGY WITH ANESTHESIA N/A 07/23/2016   Procedure: MRI OF  BRAIN WITH AND WITHOUT;  Surgeon: Medication Radiologist, MD;  Location: [facility];  Service: Radiology;  Laterality: N/A;  . RADIOLOGY WITH ANESTHESIA N/A 09/08/2016   Procedure: MRI OF BRAIN WITH AND WITHOUT CONTRAST;  Surgeon: Medication Radiologist, MD;  Location: [facility];  Service: Radiology;  Laterality: N/A;  . RADIOLOGY WITH ANESTHESIA N/A 12/10/2016   Procedure: MRI OF BRAIN WITH AND  WITHOUT;  Surgeon: Medication Radiologist, MD;  Location: [facility];  Service: Radiology;  Laterality: N/A;  . RADIOLOGY WITH ANESTHESIA N/A 03/02/2017   Procedure: MRI of BRAIN W and W/OUT CONTRAST;  Surgeon: Medication Radiologist, MD;  Location: [facility];  Service: Radiology;  Laterality: N/A;  . RADIOLOGY WITH ANESTHESIA N/A 07/29/2017   Procedure: RADIOLOGY WITH ANESTHESIA MRI OF BRAIN WITH AND WITHOUT CONTRAST;  Surgeon: Radiologist, Medication, MD;  Location: [facility];  Service: Radiology;  Laterality: N/A;  . RADIOLOGY WITH ANESTHESIA N/A 12/07/2017   Procedure: MRI WITH ANESTHESIA OF BRAIN WITH AND WITHOUT CONTRAST;  Surgeon: Radiologist, Medication, MD;  Location: [facility];  Service: Radiology;  Laterality: N/A;  . RADIOLOGY WITH ANESTHESIA N/A 04/07/2018   Procedure: MRI OF BRAIN WITH AND WITHOUT CONTRAST;  Surgeon: Radiologist, Medication, MD;  Location: [facility];  Service: Radiology;  Laterality: N/A;  . RADIOLOGY WITH ANESTHESIA N/A 08/23/2018   Procedure: MRI WITH ANESTHESIA OF THE BRAIN WITH AND WITHOUT;  Surgeon: Radiologist, Medication, MD;  Location: [place];  Service: Radiology;  Laterality: N/A;  . RADIOLOGY WITH ANESTHESIA N/A 01/24/2019   Procedure: MRI OF BRAIN WITH AND WITHOUT CONTRAST;  Surgeon: Radiologist, Medication, MD;  Location: [place];  Service: Radiology;  Laterality: N/A;  . right power port placement Right     Allergies  Allergen Reactions  . Demerol [Meperidine Hcl] Itching and Nausea And Vomiting  . Erythromycin Rash    Review of Systems  Constitutional: Positive for malaise/fatigue and weight loss (10 lb). Negative for fever.  HENT: Negative for congestion.   Respiratory: Negative for cough, shortness of breath and wheezing.   Cardiovascular: Negative for chest pain, orthopnea and PND.  Gastrointestinal: Negative for heartburn, nausea and vomiting.  Genitourinary: Negative for dysuria and flank pain.  Musculoskeletal: Negative for back pain and myalgias.  Skin: Negative for  rash.  Neurological: Negative for dizziness and headaches.  Psychiatric/Behavioral: Negative for depression, hallucinations and suicidal ideas.    Observations/Objective: Awake, alert, oriented x3 Not in acute distress  CMP Latest Ref Rng & Units 02/28/2019 01/31/2019 01/25/2019  Glucose 70 - 99 mg/dL 105(H) 125(H) 95  BUN 6 - 20 mg/dL 11 13 11   Creatinine 0.44 - 1.00 mg/dL 1.06(H) 1.05(H) 0.95  Sodium 135 - 145 mmol/L 139 143 140  Potassium 3.5 - 5.1 mmol/L 4.0 4.0 3.5  Chloride 98 - 111 mmol/L 103 104 101  CO2 22 - 32 mmol/L 26 25 30   Calcium 8.9 - 10.3 mg/dL 9.4 9.7 10.7(H)  Total Protein 6.5 - 8.1 g/dL 6.8 7.2 7.1  Total Bilirubin 0.3 - 1.2 mg/dL 0.3 0.2(L) 0.2(L)  Alkaline Phos 38 - 126 U/L 56 58 57  AST 15 - 41 U/L 22 17 16   ALT 0 - 44 U/L 21 14 12     CBC    Component Value Date/Time   WBC 3.3 (L) 02/28/2019 0808   RBC 4.15 02/28/2019 0808   HGB 12.4 02/28/2019 0808   HGB 12.1 10/26/2017 1038   HCT 38.2 02/28/2019 0808   HCT 35.7 10/26/2017 1038   PLT 146 (L) 02/28/2019 2500  PLT 167 10/26/2017 1038   MCV 92.0 02/28/2019 0808   MCV 98.4 10/26/2017 1038   MCH 29.9 02/28/2019 0808   MCHC 32.5 02/28/2019 0808   RDW 12.7 02/28/2019 0808   RDW 13.3 10/26/2017 1038   LYMPHSABS 0.8 02/28/2019 0808   LYMPHSABS 0.5 (L) 10/26/2017 1038   MONOABS 0.3 02/28/2019 0808   MONOABS 0.1 10/26/2017 1038   EOSABS 0.0 02/28/2019 0808   EOSABS 0.0 10/26/2017 1038   BASOSABS 0.0 02/28/2019 0808   BASOSABS 0.0 10/26/2017 1038    Assessment and Plan: 1. Gastroesophageal reflux disease without esophagitis Stable - pantoprazole (PROTONIX) 40 MG tablet; Take 1 tablet (40 mg total) by mouth daily.  Dispense: 90 tablet; Refill: 1  2. Metastatic breast cancer ([place]) S/p post left mastectomy and axillary lymph node dissection with expander placement) s/p suboccipital craniectomy for resection of posterior fossa metastasis, s/p radiation to the brain and breast, currently on Anastrozole,  Denosumab(Xgeva) every 6 weeks and TDM1 every 3 weeks (switch to every 4 weeks after first 6-8 treatments per Oncology) Completed last dose of radiation today. Encouraged to take daily walks as tolerated for short amounts of time which will help with her energy level.  3. Bipolar I disorder, most recent episode depressed ([place]) Stable [facility]  4. Seasonal allergies Stable - fluticasone (FLONASE) 50 MCG/ACT nasal spray; Place 2 sprays into both nostrils daily.  Dispense: 16 g; Refill: 4   Follow Up Instructions: Return in about 3 months (around 06/19/2019).  General COVID precautions discussed as she is high risk for development of complications should she be infected.  I discussed the assessment and treatment plan with the patient. The patient was provided an opportunity to ask questions and all were answered. The patient agreed with the plan and demonstrated an understanding of the instructions.   The patient was advised to call back or seek an in-person evaluation if the symptoms worsen or if the condition fails to improve as anticipated.     I provided 25 minutes total of non-face-to-face time during this encounter including median intraservice time, reviewing previous notes, labs, imaging, medications and explaining diagnosis and management.     Charlott Rakes, MD, FAAFP. [facility] [place], [place]   03/20/2019, 9:26 AM

## 2019-03-21 ENCOUNTER — Other Ambulatory Visit: Payer: Self-pay

## 2019-03-21 ENCOUNTER — Inpatient Hospital Stay: Payer: Medicaid Other

## 2019-03-21 ENCOUNTER — Inpatient Hospital Stay: Payer: Medicaid Other | Attending: Medical

## 2019-03-21 VITALS — BP 121/78 | HR 94 | Temp 98.1°F | Resp 16

## 2019-03-21 DIAGNOSIS — C7951 Secondary malignant neoplasm of bone: Secondary | ICD-10-CM | POA: Diagnosis not present

## 2019-03-21 DIAGNOSIS — C50919 Malignant neoplasm of unspecified site of unspecified female breast: Secondary | ICD-10-CM

## 2019-03-21 DIAGNOSIS — C7931 Secondary malignant neoplasm of brain: Secondary | ICD-10-CM | POA: Diagnosis not present

## 2019-03-21 DIAGNOSIS — C50312 Malignant neoplasm of lower-inner quadrant of left female breast: Secondary | ICD-10-CM | POA: Diagnosis present

## 2019-03-21 DIAGNOSIS — Z5112 Encounter for antineoplastic immunotherapy: Secondary | ICD-10-CM | POA: Insufficient documentation

## 2019-03-21 LAB — COMPREHENSIVE METABOLIC PANEL
ALT: 43 U/L (ref 0–44)
AST: 41 U/L (ref 15–41)
Albumin: 3.9 g/dL (ref 3.5–5.0)
Alkaline Phosphatase: 65 U/L (ref 38–126)
Anion gap: 13 (ref 5–15)
BUN: 10 mg/dL (ref 6–20)
CO2: 26 mmol/L (ref 22–32)
Calcium: 10.3 mg/dL (ref 8.9–10.3)
Chloride: 103 mmol/L (ref 98–111)
Creatinine, Ser: 1.14 mg/dL — ABNORMAL HIGH (ref 0.44–1.00)
GFR calc Af Amer: >60 mL/min (ref >60)
GFR calc non Af Amer: 53 mL/min — ABNORMAL LOW (ref >60)
Glucose, Bld: 93 mg/dL (ref 70–99)
Potassium: 4.1 mmol/L (ref 3.5–5.1)
Sodium: 142 mmol/L (ref 135–145)
Total Bilirubin: 0.3 mg/dL (ref 0.3–1.2)
Total Protein: 7.5 g/dL (ref 6.5–8.1)

## 2019-03-21 LAB — CBC WITH DIFFERENTIAL/PLATELET
Abs Immature Granulocytes: 0.02 10*3/uL (ref 0.00–0.07)
Basophils Absolute: 0 10*3/uL (ref 0.0–0.1)
Basophils Relative: 1 %
Eosinophils Absolute: 0 10*3/uL (ref 0.0–0.5)
Eosinophils Relative: 0 %
HCT: 43.7 % (ref 36.0–46.0)
Hemoglobin: 13.7 g/dL (ref 12.0–15.0)
Immature Granulocytes: 1 %
Lymphocytes Relative: 20 %
Lymphs Abs: 0.6 10*3/uL — ABNORMAL LOW (ref 0.7–4.0)
MCH: 29.7 pg (ref 26.0–34.0)
MCHC: 31.4 g/dL (ref 30.0–36.0)
MCV: 94.6 fL (ref 80.0–100.0)
Monocytes Absolute: 0.2 10*3/uL (ref 0.1–1.0)
Monocytes Relative: 8 %
Neutro Abs: 2 10*3/uL (ref 1.7–7.7)
Neutrophils Relative %: 70 %
Platelets: 141 10*3/uL — ABNORMAL LOW (ref 150–400)
RBC: 4.62 MIL/uL (ref 3.87–5.11)
RDW: 13.2 % (ref 11.5–15.5)
WBC: 2.8 10*3/uL — ABNORMAL LOW (ref 4.0–10.5)
nRBC: 0 % (ref 0.0–0.2)

## 2019-03-21 MED ORDER — SODIUM CHLORIDE 0.9 % IV SOLN
Freq: Once | INTRAVENOUS | Status: AC
  Administered 2019-03-21: 09:00:00 via INTRAVENOUS
  Filled 2019-03-21: qty 250

## 2019-03-21 MED ORDER — SODIUM CHLORIDE 0.9% FLUSH
10.0000 mL | INTRAVENOUS | Status: DC | PRN
  Filled 2019-03-21: qty 10

## 2019-03-21 MED ORDER — ACETAMINOPHEN 325 MG PO TABS: 650.0000 mg | ORAL_TABLET | Freq: Once | ORAL | Status: DC

## 2019-03-21 MED ORDER — SODIUM CHLORIDE 0.9 % IV SOLN
3.4000 mg/kg | Freq: Once | INTRAVENOUS | Status: AC
  Administered 2019-03-21: 260 mg via INTRAVENOUS
  Filled 2019-03-21: qty 8

## 2019-03-21 MED ORDER — DIPHENHYDRAMINE HCL 25 MG PO CAPS: 25.0000 mg | ORAL_CAPSULE | Freq: Once | ORAL | Status: DC

## 2019-03-21 MED ORDER — HEPARIN SOD (PORK) LOCK FLUSH 100 UNIT/ML IV SOLN
500.0000 [IU] | Freq: Once | INTRAVENOUS | Status: DC | PRN
  Filled 2019-03-21: qty 5

## 2019-03-21 MED ORDER — DENOSUMAB 120 MG/1.7ML ~~LOC~~ SOLN
SUBCUTANEOUS | Status: AC
  Filled 2019-03-21: qty 1.7

## 2019-03-21 MED ORDER — DENOSUMAB 120 MG/1.7ML ~~LOC~~ SOLN
120.0000 mg | Freq: Once | SUBCUTANEOUS | Status: AC
  Administered 2019-03-21: 120 mg via SUBCUTANEOUS

## 2019-03-21 NOTE — Patient Instructions (Signed)
Ado-Trastuzumab Emtansine for injection What is this medicine? ADO-TRASTUZUMAB EMTANSINE (ADD oh traz TOO zuh mab em TAN zine) is a monoclonal antibody combined with chemotherapy. It is used to treat breast cancer. This medicine may be used for other purposes; ask your health care provider or pharmacist if you have questions. COMMON BRAND NAME(S): Kadcyla What should I tell my health care provider before I take this medicine? They need to know if you have any of these conditions: -heart disease -heart failure -infection (especially a virus infection such as chickenpox, cold sores, or herpes) -liver disease -lung or breathing disease, like asthma -an unusual or allergic reaction to ado-trastuzumab emtansine, other medications, foods, dyes, or preservatives -pregnant or trying to get pregnant -breast-feeding How should I use this medicine? This medicine is for infusion into a vein. It is given by a health care professional in a hospital or clinic setting. Talk to your pediatrician regarding the use of this medicine in children. Special care may be needed. Overdosage: If you think you have taken too much of this medicine contact a poison control center or emergency room at once. NOTE: This medicine is only for you. Do not share this medicine with others. What if I miss a dose? It is important not to miss your dose. Call your doctor or health care professional if you are unable to keep an appointment. What may interact with this medicine? This medicine may also interact with the following medications: -atazanavir -boceprevir -clarithromycin -delavirdine -indinavir -dalfopristin; quinupristin -isoniazid, INH -itraconazole -ketoconazole -nefazodone -nelfinavir -ritonavir -telaprevir -telithromycin -tipranavir -voriconazole This list may not describe all possible interactions. Give your health care provider a list of all the medicines, herbs, non-prescription drugs, or dietary  supplements you use. Also tell them if you smoke, drink alcohol, or use illegal drugs. Some items may interact with your medicine. What should I watch for while using this medicine? Visit your doctor for checks on your progress. This drug may make you feel generally unwell. This is not uncommon, as chemotherapy can affect healthy cells as well as cancer cells. Report any side effects. Continue your course of treatment even though you feel ill unless your doctor tells you to stop. You may need blood work done while you are taking this medicine. Call your doctor or health care professional for advice if you get a fever, chills or sore throat, or other symptoms of a cold or flu. Do not treat yourself. This drug decreases your body's ability to fight infections. Try to avoid being around people who are sick. Be careful brushing and flossing your teeth or using a toothpick because you may get an infection or bleed more easily. If you have any dental work done, tell your dentist you are receiving this medicine. Avoid taking products that contain aspirin, acetaminophen, ibuprofen, naproxen, or ketoprofen unless instructed by your doctor. These medicines may hide a fever. Do not become pregnant while taking this medicine or for 7 months after stopping it, men with female partners should use contraception during treatment and for 4 months after the last dose. Women should inform their doctor if they wish to become pregnant or think they might be pregnant. There is a potential for serious side effects to an unborn child. Do not breast-feed an infant while taking this medicine or for 7 months after the last dose. Men who have a partner who is pregnant or who is capable of becoming pregnant should use a condom during sexual activity while taking this medicine and for   4 months after stopping it. Men should inform their doctors if they wish to father a child. This medicine may lower sperm counts. Talk to your health care  professional or pharmacist for more information. What side effects may I notice from receiving this medicine? Side effects that you should report to your doctor or health care professional as soon as possible: -allergic reactions like skin rash, itching or hives, swelling of the face, lips, or tongue -breathing problems -chest pain or palpitations -fever or chills, sore throat -general ill feeling or flu-like symptoms -light-colored stools -nausea, vomiting -pain, tingling, numbness in the hands or feet -signs and symptoms of bleeding such as bloody or black, tarry stools; red or dark-brown urine; spitting up blood or brown material that looks like coffee grounds; red spots on the skin; unusual bruising or bleeding from the eye, gums, or nose -swelling of the legs or ankles -yellowing of the eyes or skin Side effects that usually do not require medical attention (report to your doctor or health care professional if they continue or are bothersome): -changes in taste -constipation -dizziness -headache -joint pain -muscle pain -trouble sleeping -unusually weak or tired This list may not describe all possible side effects. Call your doctor for medical advice about side effects. You may report side effects to FDA at 1-800-FDA-1088. Where should I keep my medicine? This drug is given in a hospital or clinic and will not be stored at home. NOTE: This sheet is a summary. It may not cover all possible information. If you have questions about this medicine, talk to your doctor, pharmacist, or health care provider.  2019 Elsevier/Gold Standard (2016-01-06 12:11:06)  

## 2019-03-22 LAB — CANCER ANTIGEN 27.29: CA 27.29: 9.4 U/mL (ref 0.0–38.6)

## 2019-03-28 ENCOUNTER — Other Ambulatory Visit: Payer: Self-pay

## 2019-03-28 ENCOUNTER — Ambulatory Visit: Payer: Self-pay

## 2019-03-30 DIAGNOSIS — C770 Secondary and unspecified malignant neoplasm of lymph nodes of head, face and neck: Secondary | ICD-10-CM | POA: Insufficient documentation

## 2019-03-30 NOTE — Progress Notes (Signed)
  Radiation Oncology         [phone] ________________________________  Name: [name] MRN: [id number]  Date: 02/21/2019  DOB: 1961-08-12  SIMULATION AND TREATMENT PLANNING NOTE  DIAGNOSIS:     ICD-10-CM   1. 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 chest  NARRATIVE:  The patient was brought to the [facility].  Identity was confirmed.  All relevant records and images related to the planned course of therapy were reviewed.   Written consent to proceed with treatment was confirmed which was freely given after reviewing the details related to the planned course of therapy had been reviewed with the patient.  Then, the patient was set-up in a stable reproducible  supine position for radiation therapy.  CT images were obtained.  Surface markings were placed.    Medically necessary complex treatment device(s) for immobilization:   1.  Blue-bag.  2.  accuform device   The CT images were loaded into the planning software.  Then the target and avoidance structures were contoured.  Treatment planning then occurred.  The radiation prescription was entered and confirmed.  A total of 3 complex treatment devices were fabricated which relate to the designed radiation treatment fields. Each of these customized fields/ complex treatment devices will be used on a daily basis during the radiation course. I have requested : Intensity Modulated Radiotherapy (IMRT) is medically necessary for this case for the following reason:  Sparing of adjacent critical structres. The patient will be treated in a manner analogous to SBRT, but given the difficult location of the tumor the patient will receive 10 fractions to improve the safety profile.   The patient will undergo daily image guidance to ensure accurate localization of the target, and adequate minimize dose to the normal surrounding structures in close proximity to the target.   PLAN:   The patient will receive 50 Gy in 10 fractions.  Special treatment procedure The patient will be treated with a course of high dose radiation to the target. This requires extremely precise localization of the target and sophisticated, labor intensive treatment planning to achieve a highly focused radiation treatment plan. Due to the extra work involved in planning and delivery of such a procedure, this corresponds to a special treatment procedure.   ________________________________   Jodelle Gross, MD, PhD

## 2019-03-31 ENCOUNTER — Other Ambulatory Visit: Payer: Self-pay | Admitting: Oncology

## 2019-04-05 ENCOUNTER — Telehealth: Payer: Self-pay

## 2019-04-05 MED ORDER — AMPICILLIN 500 MG PO CAPS: 500.0000 mg | ORAL_CAPSULE | Freq: Three times a day (TID) | ORAL | 0 refills | Status: DC

## 2019-04-05 NOTE — Telephone Encounter (Signed)
Patient called regarding "bleeding gums."  Pt reports that she has had problems with gingivitis in the past, and worries about getting an infection.  Pt is trying to establish care with a dentist however with the pandemic appointments are unavailable at this time. Pt denies any fever.   Nurse reviewed with Dr. [name] [facility] for Ampicillin 500mg  TID X 5 days.  Nurse educated patient this can cause GI upset.  Pt voiced understanding, no further needs at this time.

## 2019-04-11 ENCOUNTER — Encounter: Payer: Self-pay | Admitting: Adult Health

## 2019-04-11 ENCOUNTER — Inpatient Hospital Stay (HOSPITAL_BASED_OUTPATIENT_CLINIC_OR_DEPARTMENT_OTHER): Payer: Medicaid Other | Admitting: Adult Health

## 2019-04-11 ENCOUNTER — Inpatient Hospital Stay: Payer: Medicaid Other

## 2019-04-11 ENCOUNTER — Inpatient Hospital Stay: Payer: Medicaid Other | Attending: Medical

## 2019-04-11 ENCOUNTER — Other Ambulatory Visit: Payer: Self-pay

## 2019-04-11 VITALS — HR 87

## 2019-04-11 VITALS — BP 125/76 | HR 102 | Temp 98.3°F | Resp 17 | Ht 65.0 in | Wt 161.9 lb

## 2019-04-11 DIAGNOSIS — Z170 Estrogen receptor positive status [ER+]: Secondary | ICD-10-CM

## 2019-04-11 DIAGNOSIS — F17210 Nicotine dependence, cigarettes, uncomplicated: Secondary | ICD-10-CM | POA: Diagnosis not present

## 2019-04-11 DIAGNOSIS — C50919 Malignant neoplasm of unspecified site of unspecified female breast: Secondary | ICD-10-CM

## 2019-04-11 DIAGNOSIS — C7931 Secondary malignant neoplasm of brain: Secondary | ICD-10-CM

## 2019-04-11 DIAGNOSIS — Z5112 Encounter for antineoplastic immunotherapy: Secondary | ICD-10-CM

## 2019-04-11 DIAGNOSIS — C50312 Malignant neoplasm of lower-inner quadrant of left female breast: Secondary | ICD-10-CM

## 2019-04-11 DIAGNOSIS — C7951 Secondary malignant neoplasm of bone: Secondary | ICD-10-CM | POA: Diagnosis not present

## 2019-04-11 DIAGNOSIS — D649 Anemia, unspecified: Secondary | ICD-10-CM | POA: Insufficient documentation

## 2019-04-11 DIAGNOSIS — C50212 Malignant neoplasm of upper-inner quadrant of left female breast: Secondary | ICD-10-CM | POA: Diagnosis not present

## 2019-04-11 DIAGNOSIS — Z9012 Acquired absence of left breast and nipple: Secondary | ICD-10-CM | POA: Diagnosis not present

## 2019-04-11 DIAGNOSIS — C773 Secondary and unspecified malignant neoplasm of axilla and upper limb lymph nodes: Secondary | ICD-10-CM | POA: Diagnosis not present

## 2019-04-11 DIAGNOSIS — Z923 Personal history of irradiation: Secondary | ICD-10-CM

## 2019-04-11 DIAGNOSIS — F319 Bipolar disorder, unspecified: Secondary | ICD-10-CM | POA: Insufficient documentation

## 2019-04-11 LAB — CBC WITH DIFFERENTIAL (CANCER CENTER ONLY)
Abs Immature Granulocytes: 0.01 10*3/uL (ref 0.00–0.07)
Basophils Absolute: 0 10*3/uL (ref 0.0–0.1)
Basophils Relative: 1 %
Eosinophils Absolute: 0 10*3/uL (ref 0.0–0.5)
Eosinophils Relative: 1 %
HCT: 43.6 % (ref 36.0–46.0)
Hemoglobin: 14 g/dL (ref 12.0–15.0)
Immature Granulocytes: 0 %
Lymphocytes Relative: 25 %
Lymphs Abs: 0.8 10*3/uL (ref 0.7–4.0)
MCH: 29.7 pg (ref 26.0–34.0)
MCHC: 32.1 g/dL (ref 30.0–36.0)
MCV: 92.6 fL (ref 80.0–100.0)
Monocytes Absolute: 0.3 10*3/uL (ref 0.1–1.0)
Monocytes Relative: 10 %
Neutro Abs: 2.1 10*3/uL (ref 1.7–7.7)
Neutrophils Relative %: 63 %
Platelet Count: 122 10*3/uL — ABNORMAL LOW (ref 150–400)
RBC: 4.71 MIL/uL (ref 3.87–5.11)
RDW: 13.1 % (ref 11.5–15.5)
WBC Count: 3.2 10*3/uL — ABNORMAL LOW (ref 4.0–10.5)
nRBC: 0 % (ref 0.0–0.2)

## 2019-04-11 LAB — COMPREHENSIVE METABOLIC PANEL
ALT: 74 U/L — ABNORMAL HIGH (ref 0–44)
AST: 57 U/L — ABNORMAL HIGH (ref 15–41)
Albumin: 3.9 g/dL (ref 3.5–5.0)
Alkaline Phosphatase: 70 U/L (ref 38–126)
Anion gap: 10 (ref 5–15)
BUN: 12 mg/dL (ref 6–20)
CO2: 27 mmol/L (ref 22–32)
Calcium: 9.6 mg/dL (ref 8.9–10.3)
Chloride: 102 mmol/L (ref 98–111)
Creatinine, Ser: 1.22 mg/dL — ABNORMAL HIGH (ref 0.44–1.00)
GFR calc Af Amer: 57 mL/min — ABNORMAL LOW (ref >60)
GFR calc non Af Amer: 49 mL/min — ABNORMAL LOW (ref >60)
Glucose, Bld: 112 mg/dL — ABNORMAL HIGH (ref 70–99)
Potassium: 4.1 mmol/L (ref 3.5–5.1)
Sodium: 139 mmol/L (ref 135–145)
Total Bilirubin: 0.3 mg/dL (ref 0.3–1.2)
Total Protein: 7.4 g/dL (ref 6.5–8.1)

## 2019-04-11 MED ORDER — SODIUM CHLORIDE 0.9% FLUSH
10.0000 mL | INTRAVENOUS | Status: DC | PRN
  Administered 2019-04-11: 10 mL
  Filled 2019-04-11: qty 10

## 2019-04-11 MED ORDER — HEPARIN SOD (PORK) LOCK FLUSH 100 UNIT/ML IV SOLN
500.0000 [IU] | Freq: Once | INTRAVENOUS | Status: AC | PRN
  Administered 2019-04-11: 500 [IU]
  Filled 2019-04-11: qty 5

## 2019-04-11 MED ORDER — SODIUM CHLORIDE 0.9 % IV SOLN
Freq: Once | INTRAVENOUS | Status: AC
  Administered 2019-04-11: 10:00:00 via INTRAVENOUS
  Filled 2019-04-11: qty 250

## 2019-04-11 MED ORDER — ACETAMINOPHEN 325 MG PO TABS: 650.0000 mg | ORAL_TABLET | Freq: Once | ORAL | Status: DC

## 2019-04-11 MED ORDER — DIPHENHYDRAMINE HCL 25 MG PO CAPS: 25.0000 mg | ORAL_CAPSULE | Freq: Once | ORAL | Status: DC

## 2019-04-11 MED ORDER — SODIUM CHLORIDE 0.9 % IV SOLN
3.4000 mg/kg | Freq: Once | INTRAVENOUS | Status: AC
  Administered 2019-04-11: 11:00:00 260 mg via INTRAVENOUS
  Filled 2019-04-11: qty 8

## 2019-04-11 NOTE — Progress Notes (Signed)
[place]  [phone] [phone]     ID: [name] DOB: 09-08-1961  MR#: [id number]  YTR#:[id number]  Patient Care Team: Charlott Rakes, MD as PCP - General (Family Medicine) Kyung Rudd, MD as Consulting Physician (Radiation Oncology) Erline Levine, MD as Consulting Physician (Neurosurgery) Corena Pilgrim, MD as Consulting Physician (Psychiatry) Mickeal Skinner Acey Lav, MD as Consulting Physician (Psychiatry) Nehemiah Settle, MD as Referring Physician (Plastic Surgery) Irene Limbo, MD as Consulting Physician (Plastic Surgery) Dillingham, Loel Lofty, DO as Attending Physician (Plastic Surgery)   CHIEF COMPLAINT: Metastatic triple positive breast cancer  CURRENT TREATMENT: Trastuzumab, denosumab, anastrozole  INTERVAL HISTORY: [name] returns today for follow-up and treatment of her metastatic triple positive breast cancer.   She was recently started on TDM-1 for likely progression in her paratracheal node.  She started this on 01/31/2019.  She tolerated this treatment well.    She also continues on denosumab. Which she is tolerating well.    REVIEW OF SYSTEMS: [name] is doing well today.  She continues to have some fatigue and weakness.  She is very delighted because she celebrated her 58th birthday and Mother's Day a couple of days ago.  She is going to see her boys this weekend and is looking forward to that.  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  [name] denies any new issues such as pain, headaches, vision changes.  She says her appetite is good.  She has some mild constipation and has a BM every 3 days with the help of fiber and Dulcolax.  She denies nausea, vomiting, bladder changes, dysphagia.  She hasn't noted fever, chills, cough, shortness of breath, chest pain, or palpitations.  A detailed ROS was otherwise non contributory.    BREAST CANCER HISTORY: From the original intake note:  [name] was  aware of a "lemon sized lump in" her left axilla for about a year before bringing it to medical attention. By then she had developed left breast and left axillary swelling (June 2016). She presented to the local emergency room and had a chest CT scan 06/06/2015 which showed a nodule in the left breast measuring 0.9 cm and questionable left axillary adenopathy. She then proceeded to bilateral diagnostic mammography and left breast ultrasonography 06/19/2015. There were no prior films for comparison (last mammography 12 years prior).. The breast density was category C. Mammography showed in the left breast upper inner quadrant a 7 cm area including a small mass and significant pleomorphic calcifications. Ultrasonography defined the mass as measuring 1.2 cm. The left axilla appeared unremarkable. There was significant skin edema.  Biopsy of the left breast mass 06/19/2015 showed (SP [id number]) an invasive ductal carcinoma, grade 2, estrogen receptor 83% positive, progesterone receptor 26% positive, and HER-2 amplified by immunohistochemstry with a 3+ reading. The patient had biopsies of a separate area in the left breast August of the same year and this showed atypical ductal hyperplasia. (SP [id number]).  Accordingly after appropriate discussion on 08/21/2015 the patient proceeded to left mastectomy with left axillary sentinel lymph node sampling, which, since the lymph nodes were positive, extended to the procedure to left axillary lymph node dissection. The pathology (SP [id number]) showed an invasive ductal carcinoma, grade 3, measuring in excess of 9 cm. There were also skin satellites, not contiguous with the invasive carcinoma. Margins were clear and ample. There was evidence of lymphovascular invasion. A total of 15 lymph nodes were removed, including 5 sentinel lymph nodes, all of  which were positive, so that the final total was 14 out of 15 lymph nodes involved by tumor. There was evidence of extranodal  extension. The final pathology was pT4b pN2a, stage IIIB  CA-27-29 and CEA 09/19/2015 were non-informative October 2016.  Unfortunately CT scans of the chest abdomen and pelvis 09/16/2015 showed bony metastases to the ri/ght scapula, left iliac crest, and also L4 and T-spine. There were questionable liver cysts which on repeat CT scan 03/02/2016 appear to be a little bit more well-defined, possibly a little larger. There were also some possible right upper lobe lung lesions.  Adjuvant treatment consisted of docetaxel, trastuzumab and pertuzumab, with the final (6th) docetaxel dose given 02/11/2016. She continues on trastuzumab and pertuzumab, with the 11th cycle given 05/05/2016. Echocardiogram 02/26/2016 showed an ejection fraction of 55%. She receives denosumab/Xgeva every 4 weeks.. She also receives radiation, started 06/09/201, to be completed 06/26/2016.  Her subsequent history is as detailed below   PAST MEDICAL HISTORY: Past Medical History:  Diagnosis Date  . Alcohol abuse   . Anemia    during chemo  . Anxiety    At age 27  . Arthritis Dx 2010  . Bipolar disorder ([place])   . Bronchitis   . Cancer ([place])    breast mets to brain  . Chronic pain   . Complication of anesthesia   . Depression   . Family history of adverse reaction to anesthesia    MOther had PONV  . Fibromyalgia Dx 2005  . GERD (gastroesophageal reflux disease)   . Headache    hx  migraines  . Lymphedema of left arm   . Opiate dependence ([place])   . PONV (postoperative nausea and vomiting)   . Port-A-Cath in place   . PTSD (post-traumatic stress disorder)      PAST SURGICAL HISTORY: Past Surgical History:  Procedure Laterality Date  . APPLICATION OF CRANIAL NAVIGATION N/A 08/14/2016   Procedure: APPLICATION OF CRANIAL NAVIGATION;  Surgeon: Erline Levine, MD;  Location: [facility];  Service: Neurosurgery;  Laterality: N/A;  . BREAST RECONSTRUCTION Left    with silicone implant  . COLONOSCOPY W/  POLYPECTOMY    . CRANIOTOMY N/A 08/14/2016   Procedure: CRANIOTOMY TUMOR EXCISION WITH Lucky Rathke;  Surgeon: Erline Levine, MD;  Location: [facility];  Service: Neurosurgery;  Laterality: N/A;  . FIBULA FRACTURE SURGERY Left   . MASTECTOMY Left   . RADIOLOGY WITH ANESTHESIA N/A 07/23/2016   Procedure: MRI OF BRAIN WITH AND WITHOUT;  Surgeon: Medication Radiologist, MD;  Location: [facility];  Service: Radiology;  Laterality: N/A;  . RADIOLOGY WITH ANESTHESIA N/A 09/08/2016   Procedure: MRI OF BRAIN WITH AND WITHOUT CONTRAST;  Surgeon: Medication Radiologist, MD;  Location: [facility];  Service: Radiology;  Laterality: N/A;  . RADIOLOGY WITH ANESTHESIA N/A 12/10/2016   Procedure: MRI OF BRAIN WITH AND WITHOUT;  Surgeon: Medication Radiologist, MD;  Location: [facility];  Service: Radiology;  Laterality: N/A;  . RADIOLOGY WITH ANESTHESIA N/A 03/02/2017   Procedure: MRI of BRAIN W and W/OUT CONTRAST;  Surgeon: Medication Radiologist, MD;  Location: [facility];  Service: Radiology;  Laterality: N/A;  . RADIOLOGY WITH ANESTHESIA N/A 07/29/2017   Procedure: RADIOLOGY WITH ANESTHESIA MRI OF BRAIN WITH AND WITHOUT CONTRAST;  Surgeon: Radiologist, Medication, MD;  Location: [facility];  Service: Radiology;  Laterality: N/A;  . RADIOLOGY WITH ANESTHESIA N/A 12/07/2017   Procedure: MRI WITH ANESTHESIA OF BRAIN WITH AND WITHOUT CONTRAST;  Surgeon: Radiologist, Medication, MD;  Location: [facility];  Service: Radiology;  Laterality: N/A;  . RADIOLOGY WITH ANESTHESIA N/A 04/07/2018   Procedure: MRI OF BRAIN WITH AND WITHOUT CONTRAST;  Surgeon: Radiologist, Medication, MD;  Location: [facility];  Service: Radiology;  Laterality: N/A;  . RADIOLOGY WITH ANESTHESIA N/A 08/23/2018   Procedure: MRI WITH ANESTHESIA OF THE BRAIN WITH AND WITHOUT;  Surgeon: Radiologist, Medication, MD;  Location: [facility];  Service: Radiology;  Laterality: N/A;  . RADIOLOGY WITH ANESTHESIA N/A 01/24/2019   Procedure: MRI OF BRAIN WITH AND WITHOUT CONTRAST;  Surgeon: Radiologist,  Medication, MD;  Location: [facility];  Service: Radiology;  Laterality: N/A;  . right power port placement Right      FAMILY HISTORY Family History  Problem Relation Age of Onset  . Diabetes Mother   . Bipolar disorder Mother   . CAD Father    The patient's father still living, age 33, in [place]. He had prostate cancer at some point in the past. The patient's mother died at age 63 from complications of diabetes. The patient had no brothers, 2 sisters. A paternal grandmother had lung cancer in the setting of tobacco abuse. There is no other history of cancer in the family to her knowledge  GYNECOLOGIC HISTORY:  No LMP recorded. Patient has had an ablation. Menarche approximately age 58. First live birth in 77. The patient is GX P2. She underwent endometrial ablation in 2016.  SOCIAL HISTORY: Updated August 2019 The patient is originally from [place]. She has lived in [place] before but more recently was in [place]. She is back here because she could not afford her rent in [place]. She is living here and a temporary situation. She is divorced. Her 2 children are [name] who lives in [place] and works as a Development worker, community, and [name] who also lives in [place] and works as a Catering manager. The patient has a grandchild, Arelia Longest, 54 years old as of July 2019, living in [place] with his mother. The patient also has a grandson born in February 2019, who also lives in [place]. The patient has not established herself with a local church yet.    ADVANCED DIRECTIVES: Not in place; at the 06/03/2016 visit the patient was given the appropriate forms to complete and notarize at her discretion   HEALTH MAINTENANCE: Social History   Tobacco Use  . Smoking status: Current Every Day Smoker    Packs/day: 0.50    Years: 43.00    Pack years: 21.50    Types: Cigarettes  . Smokeless tobacco: Never Used  Substance Use Topics  . Alcohol use: No    Comment: no ETOH since 08/22/12  . Drug  use: No    Comment: states she's in recovery program for 5 years     Colonoscopy:  PAP:  Bone density:   Allergies  Allergen Reactions  . Demerol [Meperidine Hcl] Itching and Nausea And Vomiting  . Erythromycin Rash    Current Outpatient Medications on File Prior to Visit  Medication Sig Dispense Refill  . anastrozole (ARIMIDEX) 1 MG tablet TAKE 1 TABLET BY MOUTH EVERY DAY 90 tablet 1  . Biotin 1000 MCG tablet Take 1,000 mcg by mouth daily.    . Cholecalciferol (VITAMIN D3) 5000 units CAPS Take 5,000 Units by mouth daily.    . cyclobenzaprine (FLEXERIL) 10 MG tablet Take 1 tablet (10 mg total) by mouth 2 (two) times daily. 90 tablet 1  . diazepam (VALIUM) 5 MG tablet Take 1 tab 1 hour prior to scan and may repeat if needed (Patient  not taking: Reported on 03/20/2019) 2 tablet 0  . docusate sodium (COLACE) 100 MG capsule Take 100 mg by mouth daily as needed for mild constipation.     . fluticasone (FLONASE) 50 MCG/ACT nasal spray Place 2 sprays into both nostrils daily. 16 g 4  . gabapentin (NEURONTIN) 300 MG capsule Take 1-2 capsules (300-600 mg total) by mouth See admin instructions. Take 300 mg by mouth in the morning and take 600 mg by mouth at bedtime 90 capsule 0  . ibuprofen (ADVIL) 800 MG tablet TAKE 1 TABLET BY MOUTH THREE TIMES A DAY 90 tablet 0  . lamoTRIgine (LAMICTAL) 100 MG tablet Take 100 mg by mouth every morning.    . loratadine (CLARITIN) 10 MG tablet Take 1 tablet (10 mg total) by mouth daily. 30 tablet 6  . Lurasidone HCl (LATUDA) 60 MG TABS Take 60 mg by mouth daily.    . nicotine polacrilex (NICOTINE MINI) 4 MG lozenge Take 1 lozenge (4 mg total) by mouth as needed for smoking cessation. (Patient not taking: Reported on 02/02/2019) 100 tablet 0  . ondansetron (ZOFRAN) 8 MG tablet TAKE 1 TABLET (8 MG TOTAL) BY MOUTH EVERY 8 (EIGHT) HOURS AS NEEDED FOR NAUSEA OR VOMITING. 90 tablet 1  . pantoprazole (PROTONIX) 40 MG tablet Take 1 tablet (40 mg total) by mouth daily. 90  tablet 1  . polyethylene glycol (MIRALAX / GLYCOLAX) packet TAKE 17 G BY MOUTH DAILY AS NEEDED FOR MILD CONSTIPATION. 30 packet 0  . traZODone (DESYREL) 100 MG tablet Take 1 tablet (100 mg total) by mouth at bedtime. (Patient taking differently: Take 300 mg by mouth at bedtime. ) 30 tablet 0  . varenicline (CHANTIX) 0.5 MG tablet Take 0.5 mg by mouth daily.     No current facility-administered medications on file prior to visit.      OBJECTIVE:  Vitals:   04/11/19 0849  BP: 125/76  Pulse: (!) 102  Resp: 17  Temp: 98.3 F (36.8 C)  TempSrc: Oral  SpO2: 98%  Weight: 161 lb 14.4 oz (73.4 kg)  Height: _0  (1.651 m)  Body mass index is 26.94 kg/m.  ECOG FS: 1 - Symptomatic but completely ambulatory  GENERAL: Patient is a well appearing female in no acute distress HEENT:  Sclerae anicteric.  Oropharynx clear and slightly dry. No ulcerations or evidence of oropharyngeal candidiasis. Neck is supple.  NODES:  No cervical, supraclavicular, or axillary lymphadenopathy palpated.  BREAST EXAM:  Deferred. LUNGS:  Clear to auscultation bilaterally.  No wheezes or rhonchi. HEART:  Regular rate and rhythm. No murmur appreciated. ABDOMEN:  Soft, nontender.  Positive, normoactive bowel sounds. No organomegaly palpated. MSK:  No focal spinal tenderness to palpation. Full range of motion bilaterally in the upper extremities. EXTREMITIES:  Slight left arm edema SKIN:  Clear with no obvious rashes or skin changes. No nail dyscrasia. NEURO:  Nonfocal. Well oriented.  Appropriate affect.      LAB RESULTS: No results found for: LABCA2  CBC    Component Value Date/Time   WBC 3.2 (L) 04/11/2019 0832   WBC 2.8 (L) 03/21/2019 0815   RBC 4.71 04/11/2019 0832   HGB 14.0 04/11/2019 0832   HGB 12.1 10/26/2017 1038   HCT 43.6 04/11/2019 0832   HCT 35.7 10/26/2017 1038   PLT 122 (L) 04/11/2019 0832   PLT 167 10/26/2017 1038   MCV 92.6 04/11/2019 0832   MCV 98.4 10/26/2017 1038   MCH 29.7  04/11/2019 0832   MCHC 32.1  04/11/2019 0832   RDW 13.1 04/11/2019 0832   RDW 13.3 10/26/2017 1038   LYMPHSABS 0.8 04/11/2019 0832   LYMPHSABS 0.5 (L) 10/26/2017 1038   MONOABS 0.3 04/11/2019 0832   MONOABS 0.1 10/26/2017 1038   EOSABS 0.0 04/11/2019 0832   EOSABS 0.0 10/26/2017 1038   BASOSABS 0.0 04/11/2019 0832   BASOSABS 0.0 10/26/2017 1038   CMP Latest Ref Rng & Units 03/21/2019 02/28/2019 01/31/2019  Glucose 70 - 99 mg/dL 93 105(H) 125(H)  BUN 6 - 20 mg/dL _0 Creatinine 0.44 - 1.00 mg/dL 1.14(H) 1.06(H) 1.05(H)  Sodium 135 - 145 mmol/L 142 139 143  Potassium 3.5 - 5.1 mmol/L 4.1 4.0 4.0  Chloride 98 - 111 mmol/L 103 103 104  CO2 22 - 32 mmol/L _1 Calcium 8.9 - 10.3 mg/dL 10.3 9.4 9.7  Total Protein 6.5 - 8.1 g/dL 7.5 6.8 7.2  Total Bilirubin 0.3 - 1.2 mg/dL 0.3 0.3 0.2(L)  Alkaline Phos 38 - 126 U/L 65 56 58  AST 15 - 41 U/L 41 22 17  ALT 0 - 44 U/L 43 21 14     STUDIES: No results found.  ELIGIBLE FOR AVAILABLE RESEARCH PROTOCOL: no  ASSESSMENT: 58 y.o. [place] woman with stage IV left-sided breast cancer involving bone and central nervous system  (1) s/p left breast lower inner quadrant biopsy 06/19/2015 for a clinical T2-3 NX invasive ductal carcinoma, grade 2, triple positive.  (2) status post left mastectomy and axillary lymph node dissection  with immediate expander placement 07/18/2015 for an mpT4 pN2,stage IIIB invasive ductal carcinoma, grade 3, with negative margins.  (a) definitive implant exchange to be scheduled in December   METASTATIC DISEASE: October 2016  (3) CT scan of the chest abdomen and pelvis  09/16/2015 shows metastatic lesions in the right scapula, left iliac crest, L4, and T spine. There were questionable liver cysts, with repeat CT scan 03/02/2016 showing possible right upper lobe lung lesions and possibly increased liver lesions  (a) CT scan of the chest 06/17/2016 shows no active disease in the lungs or liver  (b) Bone scan  July 2017 showed no evidence of bony metastatic disease   (c) head CT 07/08/2016 showed a cerebellar lesion, confirmed by MRI 07/23/2016, status post craniotomy 08/14/2016, confirming a metastatic deposit which was estrogen and progesterone receptor negative, HER-2 amplified with a signals ratio of 7.16, number per cell 13.25  (d) CA 27-29 is not informative  (4) received docetaxel every 3 weeks 6 together with trastuzumab and pertuzumab, last docetaxel dose 02/11/2016  (5) adjuvant radiation7/03/2016 to 06/26/2016 at [facility]: 1. The Left chest wall was treated to 23.4 Gy in 13 fractions at 1.8 Gy per fraction. 2. The Left chest wall was boosted to 10 Gy in 5 fractions at 2 Gy per fraction. 3. The Left Sclav/PAB was treated to 23.4 Gy in 13 fractions at 1.8 Gy per fraction.  [Note: Including the patient's treatment in [facility] (received 15 fractions per Dr. Maryan Rued near [place], [place]), the patient received 50.4 Gy to the left chest wall and supraclavicular region. ]  (6) started trastuzumab and pertuzumab October 2016, continuing every 3 weeks,  (a) echocardiogram 02/26/2016 showed a well preserved ejection fraction  (b) echocardiogram 07/01/2016 shows an ejection fraction in the 60-65%   (c) pertuzumab discontinued 10/2016 with uncontrolled diarrhea  (d) echocardiogram 11/11/2016 showed an ejection fraction in the 60-65%  (e) echocardiogram 03/03/2017 shows an ejection fraction of 60-65%  (f) echocardiogram on 05/19/2017  shows an ejection fraction of 55-60%  (g) echocardiogram 09/24/2017 shows the ejection fraction in the 60-65%  (h) echocardiogram 02/14/2018 shows an ejection fraction in the 60-65%  (I) echocardiogram  06/30/2018 shows an ejection fraction in the 55-60%  (m) echocardiogram on 12/08/2018 shows an ejection fraction in the 60-65% range      (7) started denosumab/Xgeva October 2017 given every 4 weeks, transitioned to every 8 weeks beginning 10/11/18 (every 6 weeks while  giving TDM1 every 3 weeks)  (8) started anastrozole October 2017   (a) bone scan 11/10/2016 shows no active disease  (b) chest CT scan 11/10/2016 stable, with no evidence of active disease  (c) chest CT and bone scan 07/02/2017 show no evidence of active disease  (d) CT scan of the chest with contrast 11/10/2017 shows some left axillary edema, but no evidence of thrombosis or adenopathy in that area, 0.9 cm precarinal lymph and 0.7 cm right upper lobe nodule node; bone lesions stable  (e) CT of the chest 05/04/2018 shows a 1.4 cm right lower paratracheal node which is slightly increased and a new right prevascular mediastinal node measuring 0.7 cm.  Bone lesions are stable.  (f) chest CT on 07/01/2018 shows no definite findings of metastatic disease in the thorax. Previously noted borderline enlarged low right paratracheal lymph node is stable to slightly decreased in size   (e) chest CT on 12/30/2018 notes mild increase in right paratracheal adneopathy, recommended PET scan.  Pet scan on 01/19/2019 showed a hypermetabolic and enlarged right paratracheal lymph node consistent with breast cancer recurrence.  (f)Trastuzumab discontinued due to scans below, TDM-1 to start on 01/31/2019.  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  (a) brain irradiation10/19/17-11/08/17: Whole brain/ 35 Gy in 14 fractions   (b) repeat brain MRI obtained 12/10/2016 shows no active disease in the brain  (c) repeat brain MRI 03/02/2017 shows no evidence of residual or recurrent disease  (d) repeat brain MRI 07/29/2017 shows no evidence of residual or recurrent  disease  (e) repeat brain MRI 12/07/2017 shows no evidence of disease recurrence.  There is progressive white matter change secondary to prior treatment.  (f) repeat brain MRI 04/07/2018 showed no evidence of disease\  (g) repeat brain MRI on 08/23/2018 shows no evidence of disease  (h) repeat brain MRI on 01/25/2019 shows no evidence of disease   PLAN: Posey is doing well today.  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  Her CMET is pending.  She is tolerating the TDM1 well and will continue this.  She has no clinical signs of progression.    I ordered CT chest and echocardiogram to be done prior to her  Follow up appt with Dr. [name] on 6/23.  Per Dr. [name], Marwa will receive it TDM1 every 3 weeks instead of every 4 weeks for the first 6-8 treatments.  She will receive number 4 today.  In two treatments she will be due for number 6.  She will have scans around that time and see Dr. [name].  At that  point he will likely transition the TDM1 back to every 4 weeks.    She will continue to receive Xgeva.  While she is receiving the TDM1 every 3 weeks, she will receive the xgeva every 6 weeks.  She is tolerating this well.    We reviewed recommendations regarding social distancing and isolation during the COVID-19 pandemic.  I reviewed that she is at increased risk for potential complications should she get the virus due to her health history and co morbidities.   Breelyn will return in 3 weeks for labs and TDM1, and in 6 weeks for labs, Dr. [name], and [place].  She knows to call for any other issues that may develop before the next visit.  The plan above was reviewed with Dr. [name] in detail who is in agreement.  A total of (30) minutes of face-to-face time was spent with this patient with greater than 50% of that time in counseling and care-coordination.   Wilber Bihari, NP 04/11/19 9:25 AM  Medical Oncology and Hematology [facility] [address] [place], [place] [id number]

## 2019-04-11 NOTE — Patient Instructions (Signed)
Ado-Trastuzumab Emtansine for injection What is this medicine? ADO-TRASTUZUMAB EMTANSINE (ADD oh traz TOO zuh mab em TAN zine) is a monoclonal antibody combined with chemotherapy. It is used to treat breast cancer. This medicine may be used for other purposes; ask your health care provider or pharmacist if you have questions. COMMON BRAND NAME(S): Kadcyla What should I tell my health care provider before I take this medicine? They need to know if you have any of these conditions: -heart disease -heart failure -infection (especially a virus infection such as chickenpox, cold sores, or herpes) -liver disease -lung or breathing disease, like asthma -an unusual or allergic reaction to ado-trastuzumab emtansine, other medications, foods, dyes, or preservatives -pregnant or trying to get pregnant -breast-feeding How should I use this medicine? This medicine is for infusion into a vein. It is given by a health care professional in a hospital or clinic setting. Talk to your pediatrician regarding the use of this medicine in children. Special care may be needed. Overdosage: If you think you have taken too much of this medicine contact a poison control center or emergency room at once. NOTE: This medicine is only for you. Do not share this medicine with others. What if I miss a dose? It is important not to miss your dose. Call your doctor or health care professional if you are unable to keep an appointment. What may interact with this medicine? This medicine may also interact with the following medications: -atazanavir -boceprevir -clarithromycin -delavirdine -indinavir -dalfopristin; quinupristin -isoniazid, INH -itraconazole -ketoconazole -nefazodone -nelfinavir -ritonavir -telaprevir -telithromycin -tipranavir -voriconazole This list may not describe all possible interactions. Give your health care provider a list of all the medicines, herbs, non-prescription drugs, or dietary  supplements you use. Also tell them if you smoke, drink alcohol, or use illegal drugs. Some items may interact with your medicine. What should I watch for while using this medicine? Visit your doctor for checks on your progress. This drug may make you feel generally unwell. This is not uncommon, as chemotherapy can affect healthy cells as well as cancer cells. Report any side effects. Continue your course of treatment even though you feel ill unless your doctor tells you to stop. You may need blood work done while you are taking this medicine. Call your doctor or health care professional for advice if you get a fever, chills or sore throat, or other symptoms of a cold or flu. Do not treat yourself. This drug decreases your body's ability to fight infections. Try to avoid being around people who are sick. Be careful brushing and flossing your teeth or using a toothpick because you may get an infection or bleed more easily. If you have any dental work done, tell your dentist you are receiving this medicine. Avoid taking products that contain aspirin, acetaminophen, ibuprofen, naproxen, or ketoprofen unless instructed by your doctor. These medicines may hide a fever. Do not become pregnant while taking this medicine or for 7 months after stopping it, men with female partners should use contraception during treatment and for 4 months after the last dose. Women should inform their doctor if they wish to become pregnant or think they might be pregnant. There is a potential for serious side effects to an unborn child. Do not breast-feed an infant while taking this medicine or for 7 months after the last dose. Men who have a partner who is pregnant or who is capable of becoming pregnant should use a condom during sexual activity while taking this medicine and for   4 months after stopping it. Men should inform their doctors if they wish to father a child. This medicine may lower sperm counts. Talk to your health care  professional or pharmacist for more information. What side effects may I notice from receiving this medicine? Side effects that you should report to your doctor or health care professional as soon as possible: -allergic reactions like skin rash, itching or hives, swelling of the face, lips, or tongue -breathing problems -chest pain or palpitations -fever or chills, sore throat -general ill feeling or flu-like symptoms -light-colored stools -nausea, vomiting -pain, tingling, numbness in the hands or feet -signs and symptoms of bleeding such as bloody or black, tarry stools; red or dark-brown urine; spitting up blood or brown material that looks like coffee grounds; red spots on the skin; unusual bruising or bleeding from the eye, gums, or nose -swelling of the legs or ankles -yellowing of the eyes or skin Side effects that usually do not require medical attention (report to your doctor or health care professional if they continue or are bothersome): -changes in taste -constipation -dizziness -headache -joint pain -muscle pain -trouble sleeping -unusually weak or tired This list may not describe all possible side effects. Call your doctor for medical advice about side effects. You may report side effects to FDA at 1-800-FDA-1088. Where should I keep my medicine? This drug is given in a hospital or clinic and will not be stored at home. NOTE: This sheet is a summary. It may not cover all possible information. If you have questions about this medicine, talk to your doctor, pharmacist, or health care provider.  2019 Elsevier/Gold Standard (2016-01-06 12:11:06)  

## 2019-04-12 ENCOUNTER — Telehealth: Payer: Self-pay | Admitting: Adult Health

## 2019-04-12 ENCOUNTER — Telehealth: Payer: Self-pay | Admitting: Radiation Oncology

## 2019-04-12 LAB — CANCER ANTIGEN 27.29: CA 27.29: 19.1 U/mL (ref 0.0–38.6)

## 2019-04-12 NOTE — Telephone Encounter (Signed)
  Radiation Oncology         [phone] ________________________________  Name: [name] MRN: [id number]  Date of Service: 04/12/2019  DOB: 12-Mar-1961  Post Treatment Telephone Note  Diagnosis:   Stage IV, T4, N3, M1 invasive lobular and ductal carcinoma, ER positive, HER-2 positive the left breast with metastatic disease to bone and brain, and paratracheal lymph node.  Interval Since Last Radiation:  3 weeks   03/07/2019-03/20/2019: The patient was treated to 40 Gy in 10 fractions with SBRT like therapy to the paratracheal node.   09/17/16-10/07/16: Whole brain treated to 35 Gy in 14 fractions  06/03/16-06/26/16: Completion of treatment to the left chest wall and nodes in [facility] with Dr. Lisbeth Renshaw: total of 45Gy  Summer 2017: received radiotherapy to the left chest wall and regional lymph nodes, received 15 fractions per Dr. Maryan Rued near [place], [place].  Narrative:  The patient was contacted today for routine follow-up. During treatment she did very well with radiotherapy and did not have significant side effects. She reports she has been tired and had a metallic taste in her mouth.  Impression/Plan: 1. Stage IV, T4, N3, M1 invasive lobular and ductal carcinoma, ER positive, HER-2 positive the left breast with metastatic disease to bone and brain, and paratracheal lymph node. The patient has been doing well since completion of radiotherapy and continues to function well even with some of her chemo side effects. We discussed that we would be coordinating her next brain MRI scan in September.  She is due for CT chest in June and to follow up with Dr. [name] and will call us if she has questions or concerns prior to her next visit.    Carola Rhine, PAC

## 2019-04-12 NOTE — Telephone Encounter (Signed)
Called regarding schedule °

## 2019-04-19 ENCOUNTER — Other Ambulatory Visit: Payer: Self-pay | Admitting: *Deleted

## 2019-04-19 MED ORDER — AMPICILLIN 500 MG PO CAPS: 500.0000 mg | ORAL_CAPSULE | Freq: Three times a day (TID) | ORAL | 0 refills | Status: AC

## 2019-04-19 NOTE — Telephone Encounter (Signed)
This RN spoke with pt per her call stating she has ongoing gingivitis issues with an area on her front tooth with the root exposed.  She has called to Dr Mosetta Anis DDS office " but I get a VM and then couldn't leave a message because it said the mail box was full "  [name] is asking " can Dr [name] refill the ampicillin until I can be seen which I hope is next week "  This RN called to Dr Johnnette Litter- Long office and obtained VM - was able to leave a message. Message left requested for pt to be seen asap due to gum issues. Left pt's contact number as well as this RN's number.  Per MD review ampicillin refilled.

## 2019-04-20 ENCOUNTER — Other Ambulatory Visit (HOSPITAL_COMMUNITY): Payer: Medicaid Other

## 2019-04-25 ENCOUNTER — Ambulatory Visit: Payer: Self-pay

## 2019-04-25 ENCOUNTER — Other Ambulatory Visit: Payer: Self-pay

## 2019-04-25 NOTE — Progress Notes (Signed)
  Radiation Oncology         [phone] ________________________________  Name: [name] MRN: [id number]  Date: 03/20/2019  DOB: 18-May-1961  End of Treatment Note  Diagnosis:   58 y.o. 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   No major difficulties stated, other than some increased fatigue.  Plan: The patient has completed radiation treatment. The patient will return to radiation oncology clinic for routine followup in one month. I advised them to call or return sooner if they have any questions or concerns related to their recovery or treatment.  ------------------------------------------------  Jodelle Gross, MD, PhD  This document serves as a record of services personally performed by Kyung Rudd, MD. It was created on his behalf by Rae Lips, a trained medical scribe. The creation of this record is based on the scribe's personal observations and the provider's statements to them. This document has been checked and approved by the attending provider.

## 2019-04-29 ENCOUNTER — Other Ambulatory Visit: Payer: Self-pay | Admitting: Oncology

## 2019-05-01 ENCOUNTER — Other Ambulatory Visit: Payer: Self-pay

## 2019-05-01 ENCOUNTER — Ambulatory Visit (HOSPITAL_COMMUNITY)
Admission: RE | Admit: 2019-05-01 | Discharge: 2019-05-01 | Disposition: A | Discharge status: Home / Self Care | Payer: Medicaid Other | Source: Ambulatory Visit | Attending: Adult Health | Admitting: Adult Health

## 2019-05-01 DIAGNOSIS — C50919 Malignant neoplasm of unspecified site of unspecified female breast: Secondary | ICD-10-CM | POA: Diagnosis not present

## 2019-05-01 DIAGNOSIS — C7951 Secondary malignant neoplasm of bone: Secondary | ICD-10-CM | POA: Diagnosis not present

## 2019-05-01 NOTE — Progress Notes (Signed)
  Echocardiogram 2D Echocardiogram has been performed.  Jennette Dubin 05/01/2019, 10:14 AM

## 2019-05-02 ENCOUNTER — Other Ambulatory Visit: Payer: Self-pay

## 2019-05-02 ENCOUNTER — Inpatient Hospital Stay: Payer: Medicaid Other

## 2019-05-02 ENCOUNTER — Inpatient Hospital Stay: Payer: Medicaid Other | Attending: Medical

## 2019-05-02 VITALS — BP 126/85 | HR 92 | Temp 97.8°F | Resp 16 | Wt 157.8 lb

## 2019-05-02 DIAGNOSIS — F17210 Nicotine dependence, cigarettes, uncomplicated: Secondary | ICD-10-CM | POA: Insufficient documentation

## 2019-05-02 DIAGNOSIS — C50312 Malignant neoplasm of lower-inner quadrant of left female breast: Secondary | ICD-10-CM | POA: Insufficient documentation

## 2019-05-02 DIAGNOSIS — Z5112 Encounter for antineoplastic immunotherapy: Secondary | ICD-10-CM | POA: Insufficient documentation

## 2019-05-02 DIAGNOSIS — Z79811 Long term (current) use of aromatase inhibitors: Secondary | ICD-10-CM | POA: Diagnosis not present

## 2019-05-02 DIAGNOSIS — Z9012 Acquired absence of left breast and nipple: Secondary | ICD-10-CM | POA: Insufficient documentation

## 2019-05-02 DIAGNOSIS — C7931 Secondary malignant neoplasm of brain: Secondary | ICD-10-CM | POA: Insufficient documentation

## 2019-05-02 DIAGNOSIS — Z9221 Personal history of antineoplastic chemotherapy: Secondary | ICD-10-CM | POA: Insufficient documentation

## 2019-05-02 DIAGNOSIS — F319 Bipolar disorder, unspecified: Secondary | ICD-10-CM | POA: Insufficient documentation

## 2019-05-02 DIAGNOSIS — D6959 Other secondary thrombocytopenia: Secondary | ICD-10-CM | POA: Diagnosis not present

## 2019-05-02 DIAGNOSIS — Z79899 Other long term (current) drug therapy: Secondary | ICD-10-CM | POA: Diagnosis not present

## 2019-05-02 DIAGNOSIS — Z170 Estrogen receptor positive status [ER+]: Secondary | ICD-10-CM | POA: Insufficient documentation

## 2019-05-02 DIAGNOSIS — C7951 Secondary malignant neoplasm of bone: Secondary | ICD-10-CM | POA: Insufficient documentation

## 2019-05-02 DIAGNOSIS — C50919 Malignant neoplasm of unspecified site of unspecified female breast: Secondary | ICD-10-CM

## 2019-05-02 DIAGNOSIS — Z923 Personal history of irradiation: Secondary | ICD-10-CM | POA: Diagnosis not present

## 2019-05-02 LAB — CBC WITH DIFFERENTIAL/PLATELET
Abs Immature Granulocytes: 0.02 10*3/uL (ref 0.00–0.07)
Basophils Absolute: 0 10*3/uL (ref 0.0–0.1)
Basophils Relative: 1 %
Eosinophils Absolute: 0 10*3/uL (ref 0.0–0.5)
Eosinophils Relative: 0 %
HCT: 43 % (ref 36.0–46.0)
Hemoglobin: 13.8 g/dL (ref 12.0–15.0)
Immature Granulocytes: 1 %
Lymphocytes Relative: 19 %
Lymphs Abs: 0.8 10*3/uL (ref 0.7–4.0)
MCH: 29.8 pg (ref 26.0–34.0)
MCHC: 32.1 g/dL (ref 30.0–36.0)
MCV: 92.9 fL (ref 80.0–100.0)
Monocytes Absolute: 0.3 10*3/uL (ref 0.1–1.0)
Monocytes Relative: 7 %
Neutro Abs: 3.1 10*3/uL (ref 1.7–7.7)
Neutrophils Relative %: 72 %
Platelets: 76 10*3/uL — ABNORMAL LOW (ref 150–400)
RBC: 4.63 MIL/uL (ref 3.87–5.11)
RDW: 13.7 % (ref 11.5–15.5)
WBC: 4.2 10*3/uL (ref 4.0–10.5)
nRBC: 0 % (ref 0.0–0.2)

## 2019-05-02 LAB — CMP (CANCER CENTER ONLY)
ALT: 36 U/L (ref 0–44)
AST: 38 U/L (ref 15–41)
Albumin: 3.9 g/dL (ref 3.5–5.0)
Alkaline Phosphatase: 74 U/L (ref 38–126)
Anion gap: 11 (ref 5–15)
BUN: 11 mg/dL (ref 6–20)
CO2: 27 mmol/L (ref 22–32)
Calcium: 9.8 mg/dL (ref 8.9–10.3)
Chloride: 102 mmol/L (ref 98–111)
Creatinine: 1.08 mg/dL — ABNORMAL HIGH (ref 0.44–1.00)
GFR, Est AFR Am: >60 mL/min (ref >60)
GFR, Estimated: 57 mL/min — ABNORMAL LOW (ref >60)
Glucose, Bld: 94 mg/dL (ref 70–99)
Potassium: 4 mmol/L (ref 3.5–5.1)
Sodium: 140 mmol/L (ref 135–145)
Total Bilirubin: 0.3 mg/dL (ref 0.3–1.2)
Total Protein: 7.3 g/dL (ref 6.5–8.1)

## 2019-05-02 MED ORDER — ACETAMINOPHEN 325 MG PO TABS
ORAL_TABLET | ORAL | Status: AC
  Filled 2019-05-02: qty 2

## 2019-05-02 MED ORDER — SODIUM CHLORIDE 0.9 % IV SOLN
260.0000 mg | Freq: Once | INTRAVENOUS | Status: AC
  Administered 2019-05-02: 11:00:00 260 mg via INTRAVENOUS
  Filled 2019-05-02: qty 5

## 2019-05-02 MED ORDER — DENOSUMAB 120 MG/1.7ML ~~LOC~~ SOLN
SUBCUTANEOUS | Status: AC
  Filled 2019-05-02: qty 1.7

## 2019-05-02 MED ORDER — SODIUM CHLORIDE 0.9 % IV SOLN
Freq: Once | INTRAVENOUS | Status: AC
  Administered 2019-05-02: 10:00:00 via INTRAVENOUS
  Filled 2019-05-02: qty 250

## 2019-05-02 MED ORDER — HEPARIN SOD (PORK) LOCK FLUSH 100 UNIT/ML IV SOLN
500.0000 [IU] | Freq: Once | INTRAVENOUS | Status: AC | PRN
  Administered 2019-05-02: 500 [IU]
  Filled 2019-05-02: qty 5

## 2019-05-02 MED ORDER — DIPHENHYDRAMINE HCL 25 MG PO CAPS: 25.0000 mg | ORAL_CAPSULE | Freq: Once | ORAL | Status: DC

## 2019-05-02 MED ORDER — ACETAMINOPHEN 325 MG PO TABS
650.0000 mg | ORAL_TABLET | Freq: Once | ORAL | Status: AC
  Administered 2019-05-02: 650 mg via ORAL

## 2019-05-02 MED ORDER — DENOSUMAB 120 MG/1.7ML ~~LOC~~ SOLN
120.0000 mg | Freq: Once | SUBCUTANEOUS | Status: AC
  Administered 2019-05-02: 120 mg via SUBCUTANEOUS

## 2019-05-02 MED ORDER — SODIUM CHLORIDE 0.9% FLUSH
10.0000 mL | INTRAVENOUS | Status: DC | PRN
  Administered 2019-05-02: 10 mL
  Filled 2019-05-02: qty 10

## 2019-05-02 NOTE — Patient Instructions (Signed)
Ado-Trastuzumab Emtansine for injection What is this medicine? ADO-TRASTUZUMAB EMTANSINE (ADD oh traz TOO zuh mab em TAN zine) is a monoclonal antibody combined with chemotherapy. It is used to treat breast cancer. This medicine may be used for other purposes; ask your health care provider or pharmacist if you have questions. COMMON BRAND NAME(S): Kadcyla What should I tell my health care provider before I take this medicine? They need to know if you have any of these conditions: -heart disease -heart failure -infection (especially a virus infection such as chickenpox, cold sores, or herpes) -liver disease -lung or breathing disease, like asthma -an unusual or allergic reaction to ado-trastuzumab emtansine, other medications, foods, dyes, or preservatives -pregnant or trying to get pregnant -breast-feeding How should I use this medicine? This medicine is for infusion into a vein. It is given by a health care professional in a hospital or clinic setting. Talk to your pediatrician regarding the use of this medicine in children. Special care may be needed. Overdosage: If you think you have taken too much of this medicine contact a poison control center or emergency room at once. NOTE: This medicine is only for you. Do not share this medicine with others. What if I miss a dose? It is important not to miss your dose. Call your doctor or health care professional if you are unable to keep an appointment. What may interact with this medicine? This medicine may also interact with the following medications: -atazanavir -boceprevir -clarithromycin -delavirdine -indinavir -dalfopristin; quinupristin -isoniazid, INH -itraconazole -ketoconazole -nefazodone -nelfinavir -ritonavir -telaprevir -telithromycin -tipranavir -voriconazole This list may not describe all possible interactions. Give your health care provider a list of all the medicines, herbs, non-prescription drugs, or dietary  supplements you use. Also tell them if you smoke, drink alcohol, or use illegal drugs. Some items may interact with your medicine. What should I watch for while using this medicine? Visit your doctor for checks on your progress. This drug may make you feel generally unwell. This is not uncommon, as chemotherapy can affect healthy cells as well as cancer cells. Report any side effects. Continue your course of treatment even though you feel ill unless your doctor tells you to stop. You may need blood work done while you are taking this medicine. Call your doctor or health care professional for advice if you get a fever, chills or sore throat, or other symptoms of a cold or flu. Do not treat yourself. This drug decreases your body's ability to fight infections. Try to avoid being around people who are sick. Be careful brushing and flossing your teeth or using a toothpick because you may get an infection or bleed more easily. If you have any dental work done, tell your dentist you are receiving this medicine. Avoid taking products that contain aspirin, acetaminophen, ibuprofen, naproxen, or ketoprofen unless instructed by your doctor. These medicines may hide a fever. Do not become pregnant while taking this medicine or for 7 months after stopping it, men with female partners should use contraception during treatment and for 4 months after the last dose. Women should inform their doctor if they wish to become pregnant or think they might be pregnant. There is a potential for serious side effects to an unborn child. Do not breast-feed an infant while taking this medicine or for 7 months after the last dose. Men who have a partner who is pregnant or who is capable of becoming pregnant should use a condom during sexual activity while taking this medicine and for   4 months after stopping it. Men should inform their doctors if they wish to father a child. This medicine may lower sperm counts. Talk to your health care  professional or pharmacist for more information. What side effects may I notice from receiving this medicine? Side effects that you should report to your doctor or health care professional as soon as possible: -allergic reactions like skin rash, itching or hives, swelling of the face, lips, or tongue -breathing problems -chest pain or palpitations -fever or chills, sore throat -general ill feeling or flu-like symptoms -light-colored stools -nausea, vomiting -pain, tingling, numbness in the hands or feet -signs and symptoms of bleeding such as bloody or black, tarry stools; red or dark-brown urine; spitting up blood or brown material that looks like coffee grounds; red spots on the skin; unusual bruising or bleeding from the eye, gums, or nose -swelling of the legs or ankles -yellowing of the eyes or skin Side effects that usually do not require medical attention (report to your doctor or health care professional if they continue or are bothersome): -changes in taste -constipation -dizziness -headache -joint pain -muscle pain -trouble sleeping -unusually weak or tired This list may not describe all possible side effects. Call your doctor for medical advice about side effects. You may report side effects to FDA at 1-800-FDA-1088. Where should I keep my medicine? This drug is given in a hospital or clinic and will not be stored at home. NOTE: This sheet is a summary. It may not cover all possible information. If you have questions about this medicine, talk to your doctor, pharmacist, or health care provider.  2019 Elsevier/Gold Standard (2016-01-06 12:11:06)  

## 2019-05-02 NOTE — Progress Notes (Signed)
Labs reviewed today, per Dr. [name] ok to proceed with treatment.

## 2019-05-03 LAB — CANCER ANTIGEN 27.29: CA 27.29: 18.3 U/mL (ref 0.0–38.6)

## 2019-05-04 ENCOUNTER — Telehealth: Payer: Self-pay | Admitting: *Deleted

## 2019-05-04 ENCOUNTER — Other Ambulatory Visit: Payer: Self-pay | Admitting: Oncology

## 2019-05-15 ENCOUNTER — Ambulatory Visit (HOSPITAL_COMMUNITY)
Admission: RE | Admit: 2019-05-15 | Discharge: 2019-05-15 | Disposition: A | Discharge status: Home / Self Care | Payer: Medicaid Other | Source: Ambulatory Visit | Attending: Adult Health | Admitting: Adult Health

## 2019-05-15 ENCOUNTER — Other Ambulatory Visit: Payer: Self-pay

## 2019-05-15 DIAGNOSIS — C7951 Secondary malignant neoplasm of bone: Secondary | ICD-10-CM | POA: Insufficient documentation

## 2019-05-15 DIAGNOSIS — C50919 Malignant neoplasm of unspecified site of unspecified female breast: Secondary | ICD-10-CM | POA: Diagnosis not present

## 2019-05-15 MED ORDER — IOHEXOL 300 MG/ML  SOLN
75.0000 mL | Freq: Once | INTRAMUSCULAR | Status: AC | PRN
  Administered 2019-05-15: 75 mL via INTRAVENOUS

## 2019-05-15 MED ORDER — SODIUM CHLORIDE (PF) 0.9 % IJ SOLN
INTRAMUSCULAR | Status: AC
  Filled 2019-05-15: qty 50

## 2019-05-22 ENCOUNTER — Other Ambulatory Visit: Payer: Self-pay | Admitting: Oncology
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
## 2019-05-23 ENCOUNTER — Ambulatory Visit: Payer: Self-pay

## 2019-05-23 ENCOUNTER — Inpatient Hospital Stay: Payer: Medicaid Other

## 2019-05-23 ENCOUNTER — Telehealth: Payer: Self-pay | Admitting: *Deleted

## 2019-05-23 ENCOUNTER — Other Ambulatory Visit: Payer: Self-pay

## 2019-05-23 ENCOUNTER — Inpatient Hospital Stay (HOSPITAL_BASED_OUTPATIENT_CLINIC_OR_DEPARTMENT_OTHER): Payer: Medicaid Other | Admitting: Oncology

## 2019-05-23 VITALS — BP 121/83 | HR 93 | Temp 98.2°F | Resp 18 | Ht 65.0 in | Wt 156.8 lb

## 2019-05-23 DIAGNOSIS — C50312 Malignant neoplasm of lower-inner quadrant of left female breast: Secondary | ICD-10-CM

## 2019-05-23 DIAGNOSIS — Z170 Estrogen receptor positive status [ER+]: Secondary | ICD-10-CM | POA: Diagnosis not present

## 2019-05-23 DIAGNOSIS — F17210 Nicotine dependence, cigarettes, uncomplicated: Secondary | ICD-10-CM

## 2019-05-23 DIAGNOSIS — F319 Bipolar disorder, unspecified: Secondary | ICD-10-CM

## 2019-05-23 DIAGNOSIS — C7931 Secondary malignant neoplasm of brain: Secondary | ICD-10-CM

## 2019-05-23 DIAGNOSIS — Z9221 Personal history of antineoplastic chemotherapy: Secondary | ICD-10-CM

## 2019-05-23 DIAGNOSIS — Z79811 Long term (current) use of aromatase inhibitors: Secondary | ICD-10-CM

## 2019-05-23 DIAGNOSIS — C770 Secondary and unspecified malignant neoplasm of lymph nodes of head, face and neck: Secondary | ICD-10-CM

## 2019-05-23 DIAGNOSIS — C7951 Secondary malignant neoplasm of bone: Secondary | ICD-10-CM | POA: Diagnosis not present

## 2019-05-23 DIAGNOSIS — Z5112 Encounter for antineoplastic immunotherapy: Secondary | ICD-10-CM | POA: Diagnosis not present

## 2019-05-23 DIAGNOSIS — Z79899 Other long term (current) drug therapy: Secondary | ICD-10-CM

## 2019-05-23 DIAGNOSIS — Z95828 Presence of other vascular implants and grafts: Secondary | ICD-10-CM

## 2019-05-23 DIAGNOSIS — C50919 Malignant neoplasm of unspecified site of unspecified female breast: Secondary | ICD-10-CM

## 2019-05-23 DIAGNOSIS — Z9012 Acquired absence of left breast and nipple: Secondary | ICD-10-CM

## 2019-05-23 DIAGNOSIS — D6959 Other secondary thrombocytopenia: Secondary | ICD-10-CM

## 2019-05-23 DIAGNOSIS — Z923 Personal history of irradiation: Secondary | ICD-10-CM

## 2019-05-23 LAB — CBC WITH DIFFERENTIAL/PLATELET
Abs Immature Granulocytes: 0.02 10*3/uL (ref 0.00–0.07)
Basophils Absolute: 0 10*3/uL (ref 0.0–0.1)
Basophils Relative: 0 %
Eosinophils Absolute: 0 10*3/uL (ref 0.0–0.5)
Eosinophils Relative: 0 %
HCT: 41.6 % (ref 36.0–46.0)
Hemoglobin: 13.3 g/dL (ref 12.0–15.0)
Immature Granulocytes: 1 %
Lymphocytes Relative: 21 %
Lymphs Abs: 0.6 10*3/uL — ABNORMAL LOW (ref 0.7–4.0)
MCH: 29.2 pg (ref 26.0–34.0)
MCHC: 32 g/dL (ref 30.0–36.0)
MCV: 91.2 fL (ref 80.0–100.0)
Monocytes Absolute: 0.3 10*3/uL (ref 0.1–1.0)
Monocytes Relative: 11 %
Neutro Abs: 2.1 10*3/uL (ref 1.7–7.7)
Neutrophils Relative %: 67 %
Platelets: 72 10*3/uL — ABNORMAL LOW (ref 150–400)
RBC: 4.56 MIL/uL (ref 3.87–5.11)
RDW: 14 % (ref 11.5–15.5)
WBC: 3.1 10*3/uL — ABNORMAL LOW (ref 4.0–10.5)
nRBC: 0 % (ref 0.0–0.2)

## 2019-05-23 LAB — COMPREHENSIVE METABOLIC PANEL
ALT: 45 U/L — ABNORMAL HIGH (ref 0–44)
AST: 47 U/L — ABNORMAL HIGH (ref 15–41)
Albumin: 3.8 g/dL (ref 3.5–5.0)
Alkaline Phosphatase: 73 U/L (ref 38–126)
Anion gap: 8 (ref 5–15)
BUN: 8 mg/dL (ref 6–20)
CO2: 27 mmol/L (ref 22–32)
Calcium: 9.1 mg/dL (ref 8.9–10.3)
Chloride: 104 mmol/L (ref 98–111)
Creatinine, Ser: 1.06 mg/dL — ABNORMAL HIGH (ref 0.44–1.00)
GFR calc Af Amer: >60 mL/min (ref >60)
GFR calc non Af Amer: 58 mL/min — ABNORMAL LOW (ref >60)
Glucose, Bld: 98 mg/dL (ref 70–99)
Potassium: 3.6 mmol/L (ref 3.5–5.1)
Sodium: 139 mmol/L (ref 135–145)
Total Bilirubin: 0.3 mg/dL (ref 0.3–1.2)
Total Protein: 6.9 g/dL (ref 6.5–8.1)

## 2019-05-23 MED ORDER — HEPARIN SOD (PORK) LOCK FLUSH 100 UNIT/ML IV SOLN
500.0000 [IU] | Freq: Once | INTRAVENOUS | Status: AC | PRN
  Administered 2019-05-23: 500 [IU]
  Filled 2019-05-23: qty 5

## 2019-05-23 MED ORDER — SODIUM CHLORIDE 0.9% FLUSH
10.0000 mL | INTRAVENOUS | Status: DC | PRN
  Administered 2019-05-23: 10 mL via INTRAVENOUS
  Filled 2019-05-23: qty 10

## 2019-05-23 MED ORDER — DIPHENHYDRAMINE HCL 25 MG PO CAPS: 25.0000 mg | ORAL_CAPSULE | Freq: Once | ORAL | Status: DC

## 2019-05-23 MED ORDER — SODIUM CHLORIDE 0.9 % IV SOLN
Freq: Once | INTRAVENOUS | Status: AC
  Administered 2019-05-23: 10:00:00 via INTRAVENOUS
  Filled 2019-05-23: qty 250

## 2019-05-23 MED ORDER — HEPARIN SOD (PORK) LOCK FLUSH 100 UNIT/ML IV SOLN
500.0000 [IU] | Freq: Once | INTRAVENOUS | Status: DC
  Filled 2019-05-23: qty 5

## 2019-05-23 MED ORDER — DIPHENHYDRAMINE HCL 25 MG PO CAPS
ORAL_CAPSULE | ORAL | Status: AC
  Filled 2019-05-23: qty 1

## 2019-05-23 MED ORDER — SODIUM CHLORIDE 0.9% FLUSH
10.0000 mL | INTRAVENOUS | Status: DC | PRN
  Administered 2019-05-23: 10 mL
  Filled 2019-05-23: qty 10

## 2019-05-23 MED ORDER — ACETAMINOPHEN 325 MG PO TABS
650.0000 mg | ORAL_TABLET | Freq: Once | ORAL | Status: AC
  Administered 2019-05-23: 650 mg via ORAL

## 2019-05-23 MED ORDER — SODIUM CHLORIDE 0.9 % IV SOLN
260.0000 mg | Freq: Once | INTRAVENOUS | Status: AC
  Administered 2019-05-23: 260 mg via INTRAVENOUS
  Filled 2019-05-23: qty 8

## 2019-05-23 MED ORDER — ACETAMINOPHEN 325 MG PO TABS
ORAL_TABLET | ORAL | Status: AC
  Filled 2019-05-23: qty 2

## 2019-05-23 NOTE — Patient Instructions (Signed)
[facility] Discharge Instructions for Patients Receiving Chemotherapy  Today you received the following chemotherapy agents Ado-trastuzumab (KADCYLA).  To help prevent nausea and vomiting after your treatment, we encourage you to take your nausea medication as prescribed.   If you develop nausea and vomiting that is not controlled by your nausea medication, call the clinic.   BELOW ARE SYMPTOMS THAT SHOULD BE REPORTED IMMEDIATELY:  *FEVER GREATER THAN 100.5 F  *CHILLS WITH OR WITHOUT FEVER  NAUSEA AND VOMITING THAT IS NOT CONTROLLED WITH YOUR NAUSEA MEDICATION  *UNUSUAL SHORTNESS OF BREATH  *UNUSUAL BRUISING OR BLEEDING  TENDERNESS IN MOUTH AND THROAT WITH OR WITHOUT PRESENCE OF ULCERS  *URINARY PROBLEMS  *BOWEL PROBLEMS  UNUSUAL RASH Items with * indicate a potential emergency and should be followed up as soon as possible.  Feel free to call the clinic should you have any questions or concerns. The clinic phone number is [phone].  Please show the CHEMO ALERT CARD at check-in to the Emergency Department and triage nurse.  Coronavirus (COVID-19) Are you at risk?  Are you at risk for the Coronavirus (COVID-19)?  To be considered HIGH RISK for Coronavirus (COVID-19), you have to meet the following criteria:  . Traveled to [place], [place], [place], [place] or [place]; or in the [place] to [place], [place], [place], or [place]; and have fever, cough, and shortness of breath within the last 2 weeks of travel OR . Been in close contact with a person diagnosed with COVID-19 within the last 2 weeks and have fever, cough, and shortness of breath . IF YOU DO NOT MEET THESE CRITERIA, YOU ARE CONSIDERED LOW RISK FOR COVID-19.  What to do if you are HIGH RISK for COVID-19?  . If you are having a medical emergency, call 911. . Seek medical care right away. Before you go to a doctor's office, urgent care or emergency department, call ahead and tell  them about your recent travel, contact with someone diagnosed with COVID-19, and your symptoms. You should receive instructions from your physician's office regarding next steps of care.  . When you arrive at healthcare provider, tell the healthcare staff immediately you have returned from visiting [place], [place], [place], [place] or [place]; or traveled in the [place] to [place], [place], [place], or [place]; in the last two weeks or you have been in close contact with a person diagnosed with COVID-19 in the last 2 weeks.   . Tell the health care staff about your symptoms: fever, cough and shortness of breath. . After you have been seen by a medical provider, you will be either: o Tested for (COVID-19) and discharged home on quarantine except to seek medical care if symptoms worsen, and asked to  - Stay home and avoid contact with others until you get your results (4-5 days)  - Avoid travel on public transportation if possible (such as bus, train, or airplane) or o Sent to the Emergency Department by EMS for evaluation, COVID-19 testing, and possible admission depending on your condition and test results.  What to do if you are LOW RISK for COVID-19?  Reduce your risk of any infection by using the same precautions used for avoiding the common cold or flu:  . Wash your hands often with soap and warm water for at least 20 seconds.  If soap and water are not readily available, use an alcohol-based hand sanitizer with at least 60% alcohol.  . If coughing or   cover your mouth and nose by coughing or sneezing into the elbow areas of your shirt or coat, into a tissue or into your sleeve (not your hands). . Avoid shaking hands with others and consider head nods or verbal greetings only. . Avoid touching your eyes, nose, or mouth with unwashed hands.  . Avoid close contact with people who are sick. . Avoid places or events with large numbers of people in one location, like concerts or  sporting events. . Carefully consider travel plans you have or are making. . If you are planning any travel outside or inside the [place], visit the CDC's Travelers' Health webpage for the latest health notices. . If you have some symptoms but not all symptoms, continue to monitor at home and seek medical attention if your symptoms worsen. . If you are having a medical emergency, call 911.   [place] / e-Visit: [email or web address]         [facility]: [facility]: [phone]                   [facility]: [phone]

## 2019-05-23 NOTE — Telephone Encounter (Signed)
Per MD ok to treat today with noted platelets of 70,000.

## 2019-05-24 ENCOUNTER — Telehealth: Payer: Self-pay | Admitting: Adult Health

## 2019-05-24 LAB — CANCER ANTIGEN 27.29: CA 27.29: 15 U/mL (ref 0.0–38.6)

## 2019-05-24 NOTE — Telephone Encounter (Signed)
I talk with patient regarding schedule  

## 2019-05-26 ENCOUNTER — Other Ambulatory Visit: Payer: Self-pay | Admitting: Oncology

## 2019-05-31 ENCOUNTER — Other Ambulatory Visit: Payer: Self-pay

## 2019-05-31 DIAGNOSIS — Z85841 Personal history of malignant neoplasm of brain: Secondary | ICD-10-CM

## 2019-05-31 DIAGNOSIS — C50919 Malignant neoplasm of unspecified site of unspecified female breast: Secondary | ICD-10-CM

## 2019-05-31 DIAGNOSIS — C50312 Malignant neoplasm of lower-inner quadrant of left female breast: Secondary | ICD-10-CM

## 2019-05-31 DIAGNOSIS — Z8589 Personal history of malignant neoplasm of other organs and systems: Secondary | ICD-10-CM

## 2019-05-31 DIAGNOSIS — C770 Secondary and unspecified malignant neoplasm of lymph nodes of head, face and neck: Secondary | ICD-10-CM

## 2019-05-31 DIAGNOSIS — C7951 Secondary malignant neoplasm of bone: Secondary | ICD-10-CM

## 2019-06-08 ENCOUNTER — Ambulatory Visit (HOSPITAL_COMMUNITY): Payer: Medicaid Other

## 2019-06-09 ENCOUNTER — Ambulatory Visit (HOSPITAL_COMMUNITY): Payer: Medicaid Other

## 2019-06-14 ENCOUNTER — Telehealth: Payer: Self-pay | Admitting: *Deleted

## 2019-06-14 NOTE — Telephone Encounter (Signed)
Received vm call from pt stating that she needs an MRI with sedation & has not been scheduled. She states someone called her but it was not scheduled with sedation & is asking for someone to help with this.  Called [employer] & scheduler will email someone at [facility] & have them f/u. Received call from [facility] & she states she will schedule & call pt.

## 2019-06-20 ENCOUNTER — Inpatient Hospital Stay: Payer: Medicaid Other

## 2019-06-20 ENCOUNTER — Telehealth: Payer: Self-pay | Admitting: *Deleted

## 2019-06-20 ENCOUNTER — Inpatient Hospital Stay: Payer: Medicaid Other | Attending: Medical

## 2019-06-20 ENCOUNTER — Other Ambulatory Visit: Payer: Self-pay

## 2019-06-20 ENCOUNTER — Telehealth: Payer: Self-pay | Admitting: Internal Medicine

## 2019-06-20 VITALS — BP 114/72 | HR 90 | Temp 99.1°F | Resp 18 | Wt 156.0 lb

## 2019-06-20 DIAGNOSIS — C50919 Malignant neoplasm of unspecified site of unspecified female breast: Secondary | ICD-10-CM

## 2019-06-20 DIAGNOSIS — Z95828 Presence of other vascular implants and grafts: Secondary | ICD-10-CM | POA: Insufficient documentation

## 2019-06-20 DIAGNOSIS — C7951 Secondary malignant neoplasm of bone: Secondary | ICD-10-CM | POA: Diagnosis not present

## 2019-06-20 DIAGNOSIS — C50312 Malignant neoplasm of lower-inner quadrant of left female breast: Secondary | ICD-10-CM | POA: Insufficient documentation

## 2019-06-20 DIAGNOSIS — C7931 Secondary malignant neoplasm of brain: Secondary | ICD-10-CM | POA: Insufficient documentation

## 2019-06-20 DIAGNOSIS — Z5111 Encounter for antineoplastic chemotherapy: Secondary | ICD-10-CM | POA: Insufficient documentation

## 2019-06-20 DIAGNOSIS — F411 Generalized anxiety disorder: Secondary | ICD-10-CM

## 2019-06-20 DIAGNOSIS — Z5112 Encounter for antineoplastic immunotherapy: Secondary | ICD-10-CM | POA: Diagnosis present

## 2019-06-20 LAB — COMPREHENSIVE METABOLIC PANEL
ALT: 32 U/L (ref 0–44)
AST: 39 U/L (ref 15–41)
Albumin: 3.6 g/dL (ref 3.5–5.0)
Alkaline Phosphatase: 80 U/L (ref 38–126)
Anion gap: 11 (ref 5–15)
BUN: 9 mg/dL (ref 6–20)
CO2: 26 mmol/L (ref 22–32)
Calcium: 9.2 mg/dL (ref 8.9–10.3)
Chloride: 103 mmol/L (ref 98–111)
Creatinine, Ser: 0.99 mg/dL (ref 0.44–1.00)
GFR calc Af Amer: >60 mL/min (ref >60)
GFR calc non Af Amer: >60 mL/min (ref >60)
Glucose, Bld: 99 mg/dL (ref 70–99)
Potassium: 3.7 mmol/L (ref 3.5–5.1)
Sodium: 140 mmol/L (ref 135–145)
Total Bilirubin: 0.3 mg/dL (ref 0.3–1.2)
Total Protein: 6.8 g/dL (ref 6.5–8.1)

## 2019-06-20 LAB — CBC WITH DIFFERENTIAL/PLATELET
Abs Immature Granulocytes: 0.01 10*3/uL (ref 0.00–0.07)
Basophils Absolute: 0 10*3/uL (ref 0.0–0.1)
Basophils Relative: 1 %
Eosinophils Absolute: 0 10*3/uL (ref 0.0–0.5)
Eosinophils Relative: 0 %
HCT: 41.4 % (ref 36.0–46.0)
Hemoglobin: 13.3 g/dL (ref 12.0–15.0)
Immature Granulocytes: 0 %
Lymphocytes Relative: 16 %
Lymphs Abs: 0.6 10*3/uL — ABNORMAL LOW (ref 0.7–4.0)
MCH: 29.2 pg (ref 26.0–34.0)
MCHC: 32.1 g/dL (ref 30.0–36.0)
MCV: 90.8 fL (ref 80.0–100.0)
Monocytes Absolute: 0.3 10*3/uL (ref 0.1–1.0)
Monocytes Relative: 8 %
Neutro Abs: 2.9 10*3/uL (ref 1.7–7.7)
Neutrophils Relative %: 75 %
Platelets: 72 10*3/uL — ABNORMAL LOW (ref 150–400)
RBC: 4.56 MIL/uL (ref 3.87–5.11)
RDW: 14.5 % (ref 11.5–15.5)
WBC: 3.9 10*3/uL — ABNORMAL LOW (ref 4.0–10.5)
nRBC: 0 % (ref 0.0–0.2)

## 2019-06-20 MED ORDER — ACETAMINOPHEN 325 MG PO TABS
ORAL_TABLET | ORAL | Status: AC
  Filled 2019-06-20: qty 2

## 2019-06-20 MED ORDER — SODIUM CHLORIDE 0.9 % IV SOLN
260.0000 mg | Freq: Once | INTRAVENOUS | Status: AC
  Administered 2019-06-20: 260 mg via INTRAVENOUS
  Filled 2019-06-20: qty 5

## 2019-06-20 MED ORDER — SODIUM CHLORIDE 0.9 % IV SOLN
Freq: Once | INTRAVENOUS | Status: AC
  Administered 2019-06-20: 09:00:00 via INTRAVENOUS
  Filled 2019-06-20: qty 250

## 2019-06-20 MED ORDER — HEPARIN SOD (PORK) LOCK FLUSH 100 UNIT/ML IV SOLN
500.0000 [IU] | Freq: Once | INTRAVENOUS | Status: AC | PRN
  Administered 2019-06-20: 500 [IU]
  Filled 2019-06-20: qty 5

## 2019-06-20 MED ORDER — DENOSUMAB 120 MG/1.7ML ~~LOC~~ SOLN
SUBCUTANEOUS | Status: AC
  Filled 2019-06-20: qty 1.7

## 2019-06-20 MED ORDER — ACETAMINOPHEN 325 MG PO TABS
650.0000 mg | ORAL_TABLET | Freq: Once | ORAL | Status: AC
  Administered 2019-06-20: 650 mg via ORAL

## 2019-06-20 MED ORDER — DENOSUMAB 120 MG/1.7ML ~~LOC~~ SOLN
120.0000 mg | Freq: Once | SUBCUTANEOUS | Status: AC
  Administered 2019-06-20: 120 mg via SUBCUTANEOUS

## 2019-06-20 MED ORDER — SODIUM CHLORIDE 0.9% FLUSH
10.0000 mL | INTRAVENOUS | Status: DC | PRN
  Administered 2019-06-20: 10 mL
  Filled 2019-06-20: qty 10

## 2019-06-20 MED ORDER — DIPHENHYDRAMINE HCL 25 MG PO CAPS: 25.0000 mg | ORAL_CAPSULE | Freq: Once | ORAL | Status: DC

## 2019-06-20 MED ORDER — DIPHENHYDRAMINE HCL 25 MG PO CAPS
ORAL_CAPSULE | ORAL | Status: AC
  Filled 2019-06-20: qty 1

## 2019-06-20 NOTE — Telephone Encounter (Signed)
OK to treat with continued plts of 72,000 per MD.

## 2019-06-20 NOTE — Patient Instructions (Signed)
[facility] Discharge Instructions for Patients Receiving Chemotherapy  Today you received the following chemotherapy agents Ado-trastuzumab (KADCYLA) also Xgeva  To help prevent nausea and vomiting after your treatment, we encourage you to take your nausea medication as prescribed.  If you develop nausea and vomiting that is not controlled by your nausea medication, call the clinic.   BELOW ARE SYMPTOMS THAT SHOULD BE REPORTED IMMEDIATELY:  *FEVER GREATER THAN 100.5 F  *CHILLS WITH OR WITHOUT FEVER  NAUSEA AND VOMITING THAT IS NOT CONTROLLED WITH YOUR NAUSEA MEDICATION  *UNUSUAL SHORTNESS OF BREATH  *UNUSUAL BRUISING OR BLEEDING  TENDERNESS IN MOUTH AND THROAT WITH OR WITHOUT PRESENCE OF ULCERS  *URINARY PROBLEMS  *BOWEL PROBLEMS  UNUSUAL RASH Items with * indicate a potential emergency and should be followed up as soon as possible.  Feel free to call the clinic should you have any questions or concerns. The clinic phone number is [phone].  Please show the [place] at check-in to the Emergency Department and triage nurse.  Coronavirus (COVID-19) Are you at risk?  Are you at risk for the Coronavirus (COVID-19)?  To be considered HIGH RISK for Coronavirus (COVID-19), you have to meet the following criteria:  . Traveled to [place], [place], [place], [place] or [place]; or in the [place] to [place], [place], [place], or [place]; and have fever, cough, and shortness of breath within the last 2 weeks of travel OR . Been in close contact with a person diagnosed with COVID-19 within the last 2 weeks and have fever, cough, and shortness of breath . IF YOU DO NOT MEET THESE CRITERIA, YOU ARE CONSIDERED LOW RISK FOR COVID-19.  What to do if you are HIGH RISK for COVID-19?  [name] If you are having a medical emergency, call 911. . Seek medical care right away. Before you go to a doctor's office, urgent care or emergency department, call ahead  and tell them about your recent travel, contact with someone diagnosed with COVID-19, and your symptoms. You should receive instructions from your physician's office regarding next steps of care.  . When you arrive at healthcare provider, tell the healthcare staff immediately you have returned from visiting [place], [place], [place], [place] or [place]; or traveled in the [place] to [place], [place], [place], or [place]; in the last two weeks or you have been in close contact with a person diagnosed with COVID-19 in the last 2 weeks.   . Tell the health care staff about your symptoms: fever, cough and shortness of breath. . After you have been seen by a medical provider, you will be either: o Tested for (COVID-19) and discharged home on quarantine except to seek medical care if symptoms worsen, and asked to  - Stay home and avoid contact with others until you get your results (4-5 days)  - Avoid travel on public transportation if possible (such as bus, train, or airplane) or o Sent to the Emergency Department by EMS for evaluation, COVID-19 testing, and possible admission depending on your condition and test results.  What to do if you are LOW RISK for COVID-19?  Reduce your risk of any infection by using the same precautions used for avoiding the common cold or flu:  [name] Wash your hands often with soap and warm water for at least 20 seconds.  If soap and water are not readily available, use an alcohol-based hand sanitizer with at least 60% alcohol.  . If coughing  or sneezing, cover your mouth and nose by coughing or sneezing into the elbow areas of your shirt or coat, into a tissue or into your sleeve (not your hands). . Avoid shaking hands with others and consider head nods or verbal greetings only. . Avoid touching your eyes, nose, or mouth with unwashed hands.  . Avoid close contact with people who are sick. . Avoid places or events with large numbers of people in one location, like  concerts or sporting events. . Carefully consider travel plans you have or are making. . If you are planning any travel outside or inside the [place], visit the CDC's Travelers' Health webpage for the latest health notices. . If you have some symptoms but not all symptoms, continue to monitor at home and seek medical attention if your symptoms worsen. . If you are having a medical emergency, call 911.   [place] / e-Visit: [email or web address]         [facility]: [facility]: [phone]                   [facility]: [phone]

## 2019-06-20 NOTE — Telephone Encounter (Signed)
Scheduled appt per 7/20 sch message - spoke with pt - she is aware of appt added and reminder letter mailed.

## 2019-06-21 LAB — CANCER ANTIGEN 27.29: CA 27.29: 8 U/mL (ref 0.0–38.6)

## 2019-06-23 ENCOUNTER — Other Ambulatory Visit: Payer: Self-pay | Admitting: Oncology

## 2019-06-27 ENCOUNTER — Other Ambulatory Visit: Payer: Self-pay | Admitting: Family Medicine

## 2019-07-03 ENCOUNTER — Other Ambulatory Visit: Payer: Self-pay | Admitting: Radiation Therapy

## 2019-07-03 ENCOUNTER — Other Ambulatory Visit (HOSPITAL_COMMUNITY)
Admission: RE | Admit: 2019-07-03 | Discharge: 2019-07-03 | Disposition: A | Discharge status: Home / Self Care | Payer: Medicaid Other | Source: Ambulatory Visit | Attending: Oncology | Admitting: Oncology

## 2019-07-03 DIAGNOSIS — Z01812 Encounter for preprocedural laboratory examination: Secondary | ICD-10-CM | POA: Insufficient documentation

## 2019-07-03 DIAGNOSIS — Z20828 Contact with and (suspected) exposure to other viral communicable diseases: Secondary | ICD-10-CM | POA: Insufficient documentation

## 2019-07-03 LAB — SARS CORONAVIRUS 2 (TAT 6-24 HRS): SARS Coronavirus 2: NEGATIVE

## 2019-07-04 ENCOUNTER — Other Ambulatory Visit: Payer: Self-pay

## 2019-07-04 ENCOUNTER — Telehealth: Payer: Self-pay | Admitting: *Deleted

## 2019-07-04 ENCOUNTER — Encounter (HOSPITAL_COMMUNITY): Payer: Self-pay | Admitting: *Deleted

## 2019-07-04 ENCOUNTER — Telehealth: Payer: Self-pay

## 2019-07-04 NOTE — Telephone Encounter (Signed)
-----   Message from Gardenia Phlegm, NP sent at 07/04/2019  8:00 AM EDT ----- Please call patient with negative results ----- Message ----- From: Buel Ream, [facility] [place] Sent: 07/03/2019   5:58 PM EDT To: Chauncey Cruel, MD

## 2019-07-04 NOTE — Progress Notes (Signed)
Left message for Dr. Virgie Dad nurse informing them that we need a H&P that has been done in the last 30 days for pt's MRI with anesthesia. Requested that they call me back as soon as possible.

## 2019-07-04 NOTE — Telephone Encounter (Signed)
This RN received call that pt needed an in person H&P for MRI to be performed under sedation - H&P has to be within 30 days of procedure. MRI is scheduled for 07/06/2019.  Per above this RN scheduled pt for visit with NP on 07/05/2019. [employer] aware of above.  Preadmissions aware of visit as well.

## 2019-07-04 NOTE — Telephone Encounter (Signed)
TC to Pt. Informed her of negative results for COVID testing. Pt verbalized understanding

## 2019-07-04 NOTE — Progress Notes (Signed)
Spoke with pt for pre-op call. Pt denies cardiac history, HTN or Diabetes. Pt does have hx of Breast cancer with brain mets. Pt is to see the NP at Dr. Virgie Dad office Wednesday, 07/05/19 to have a H&P done for her MRI with anesthesia on Thursday.   Pt had Covid test done yesterday and it is negative. She states she has been in quarantine since and knows to wear her mask and social distance when she goes for her appt.    Coronavirus Screening  Have you experienced the following symptoms:  Cough N0 Fever (>100.78F)  NO Runny nose NO Sore throat NO Difficulty breathing/shortness of breath  NO  Have you or a family member traveled in the last 14 days and where? NO  Patient reminded that hospital visitation restrictions are in effect and the importance of the restrictions. Pt informed that she can have 1 visitor wait in the waiting room while she is in pre-op, MRI and PACU. She voiced understanding.

## 2019-07-05 ENCOUNTER — Encounter: Payer: Self-pay | Admitting: Adult Health

## 2019-07-05 ENCOUNTER — Other Ambulatory Visit: Payer: Self-pay

## 2019-07-05 ENCOUNTER — Inpatient Hospital Stay: Payer: Medicaid Other

## 2019-07-05 ENCOUNTER — Inpatient Hospital Stay: Payer: Medicaid Other | Attending: Medical | Admitting: Adult Health

## 2019-07-05 VITALS — BP 106/71 | HR 92 | Temp 99.1°F | Resp 19 | Ht 65.0 in | Wt 156.0 lb

## 2019-07-05 DIAGNOSIS — Z9012 Acquired absence of left breast and nipple: Secondary | ICD-10-CM | POA: Insufficient documentation

## 2019-07-05 DIAGNOSIS — Z79811 Long term (current) use of aromatase inhibitors: Secondary | ICD-10-CM | POA: Diagnosis not present

## 2019-07-05 DIAGNOSIS — F17210 Nicotine dependence, cigarettes, uncomplicated: Secondary | ICD-10-CM | POA: Diagnosis not present

## 2019-07-05 DIAGNOSIS — C50312 Malignant neoplasm of lower-inner quadrant of left female breast: Secondary | ICD-10-CM

## 2019-07-05 DIAGNOSIS — Z5112 Encounter for antineoplastic immunotherapy: Secondary | ICD-10-CM | POA: Diagnosis present

## 2019-07-05 DIAGNOSIS — I890 Lymphedema, not elsewhere classified: Secondary | ICD-10-CM | POA: Insufficient documentation

## 2019-07-05 DIAGNOSIS — D649 Anemia, unspecified: Secondary | ICD-10-CM | POA: Insufficient documentation

## 2019-07-05 DIAGNOSIS — Z170 Estrogen receptor positive status [ER+]: Secondary | ICD-10-CM | POA: Insufficient documentation

## 2019-07-05 DIAGNOSIS — C7951 Secondary malignant neoplasm of bone: Secondary | ICD-10-CM

## 2019-07-05 DIAGNOSIS — Z801 Family history of malignant neoplasm of trachea, bronchus and lung: Secondary | ICD-10-CM | POA: Diagnosis not present

## 2019-07-05 DIAGNOSIS — F319 Bipolar disorder, unspecified: Secondary | ICD-10-CM | POA: Diagnosis not present

## 2019-07-05 LAB — CBC WITH DIFFERENTIAL (CANCER CENTER ONLY)
Abs Immature Granulocytes: 0.01 10*3/uL (ref 0.00–0.07)
Basophils Absolute: 0 10*3/uL (ref 0.0–0.1)
Basophils Relative: 0 %
Eosinophils Absolute: 0 10*3/uL (ref 0.0–0.5)
Eosinophils Relative: 0 %
HCT: 42.1 % (ref 36.0–46.0)
Hemoglobin: 13.5 g/dL (ref 12.0–15.0)
Immature Granulocytes: 0 %
Lymphocytes Relative: 24 %
Lymphs Abs: 0.9 10*3/uL (ref 0.7–4.0)
MCH: 29.1 pg (ref 26.0–34.0)
MCHC: 32.1 g/dL (ref 30.0–36.0)
MCV: 90.7 fL (ref 80.0–100.0)
Monocytes Absolute: 0.5 10*3/uL (ref 0.1–1.0)
Monocytes Relative: 12 %
Neutro Abs: 2.4 10*3/uL (ref 1.7–7.7)
Neutrophils Relative %: 64 %
Platelet Count: 54 10*3/uL — ABNORMAL LOW (ref 150–400)
RBC: 4.64 MIL/uL (ref 3.87–5.11)
RDW: 14.4 % (ref 11.5–15.5)
WBC Count: 3.9 10*3/uL — ABNORMAL LOW (ref 4.0–10.5)
nRBC: 0 % (ref 0.0–0.2)

## 2019-07-05 LAB — PROTIME-INR
INR: 0.8 (ref 0.8–1.2)
Prothrombin Time: 11.4 seconds (ref 11.4–15.2)

## 2019-07-05 LAB — APTT: aPTT: 29 seconds (ref 24–36)

## 2019-07-05 NOTE — Progress Notes (Signed)
[place]  [phone] [phone]     ID: [name] DOB: 03-06-1961  MR#: [id number]  JYN#:[id number]  Patient Care Team: Charlott Rakes, MD as PCP - General (Family Medicine) Kyung Rudd, MD as Consulting Physician (Radiation Oncology) Erline Levine, MD as Consulting Physician (Neurosurgery) Corena Pilgrim, MD as Consulting Physician (Psychiatry) Mickeal Skinner Acey Lav, MD as Consulting Physician (Psychiatry) Nehemiah Settle, MD as Referring Physician (Plastic Surgery) Irene Limbo, MD as Consulting Physician (Plastic Surgery) Dillingham, Loel Lofty, DO as Attending Physician (Plastic Surgery)   CHIEF COMPLAINT: Metastatic triple positive breast cancer  CURRENT TREATMENT: T-DM1, denosumab, anastrozole   INTERVAL HISTORY: [name] returns today for follow-up and treatment of her metastatic triple positive breast cancer.   She continues on TDM-1. She says she is tolerating this well, other than the decreased platelet count that has occurred since starting it.    She also continues on denosumab. She has no issues with this.  She also continues on anastrozole. She is taking this daily with good tolerance.  [name]'s last echocardiogram on 05/01/2019, showed an ejection fraction in the 60% - 65% range.     REVIEW OF SYSTEMS: [name] has left arm swelling.  She has h/o lymphedema.  She had seen PT for this in the past, however hasn't seen them in one year.  She notes that she is wearing a sleeve from time to time and wears the night time machine they recommended, however it has persisted.  She wants to know if she could go back.  She also notes a slight increase in easy bruising.  She denies any easy bleeding, such as BRBPR or epistaxis, but she does feel like she bruises easier and larger than she previously did.  She denies any other issues such as fever, chills, chest pain, palpitations, cough, shortness of breath, nausea, vomiting, bowel/bladder  changes.  A detailed ROS was otherwise non contributory.   BREAST CANCER HISTORY: From the original intake note:  [name] was aware of a "lemon sized lump in" her left axilla for about a year before bringing it to medical attention. By then she had developed left breast and left axillary swelling (June 2016). She presented to the local emergency room and had a chest CT scan 06/06/2015 which showed a nodule in the left breast measuring 0.9 cm and questionable left axillary adenopathy. She then proceeded to bilateral diagnostic mammography and left breast ultrasonography 06/19/2015. There were no prior films for comparison (last mammography 12 years prior).. The breast density was category C. Mammography showed in the left breast upper inner quadrant a 7 cm area including a small mass and significant pleomorphic calcifications. Ultrasonography defined the mass as measuring 1.2 cm. The left axilla appeared unremarkable. There was significant skin edema.  Biopsy of the left breast mass 06/19/2015 showed (SP [id number]) an invasive ductal carcinoma, grade 2, estrogen receptor 83% positive, progesterone receptor 26% positive, and HER-2 amplified by immunohistochemstry with a 3+ reading. The patient had biopsies of a separate area in the left breast August of the same year and this showed atypical ductal hyperplasia. (SP [id number]).  Accordingly after appropriate discussion on 08/21/2015 the patient proceeded to left mastectomy with left axillary sentinel lymph node sampling, which, since the lymph nodes were positive, extended to the procedure to left axillary lymph node dissection. The pathology (SP [id number]) showed an invasive ductal carcinoma, grade 3, measuring in excess of 9 cm. There were also skin satellites, not contiguous with the invasive carcinoma. Margins were clear  and ample. There was evidence of lymphovascular invasion. A total of 15 lymph nodes were removed, including 5 sentinel lymph nodes, all of  which were positive, so that the final total was 14 out of 15 lymph nodes involved by tumor. There was evidence of extranodal extension. The final pathology was pT4b pN2a, stage IIIB  CA-27-29 and CEA 09/19/2015 were non-informative October 2016.  Unfortunately CT scans of the chest abdomen and pelvis 09/16/2015 showed bony metastases to the ri/ght scapula, left iliac crest, and also L4 and T-spine. There were questionable liver cysts which on repeat CT scan 03/02/2016 appear to be a little bit more well-defined, possibly a little larger. There were also some possible right upper lobe lung lesions.  Adjuvant treatment consisted of docetaxel, trastuzumab and pertuzumab, with the final (6th) docetaxel dose given 02/11/2016. She continues on trastuzumab and pertuzumab, with the 11th cycle given 05/05/2016. Echocardiogram 02/26/2016 showed an ejection fraction of 55%. She receives denosumab/Xgeva every 4 weeks.. She also receives radiation, started 06/09/201, to be completed 06/26/2016.  Her subsequent history is as detailed below   PAST MEDICAL HISTORY: Past Medical History:  Diagnosis Date   Alcohol abuse    Anemia    during chemo   Anxiety    At age 58   Arthritis Dx 58   Bipolar disorder ([place])    Bronchitis    Cancer ([place])    breast mets to brain   Chronic pain    Complication of anesthesia    Depression    Family history of adverse reaction to anesthesia    MOther had PONV   Fibromyalgia Dx 2005   GERD (gastroesophageal reflux disease)    Headache    hx  migraines   Lymphedema of left arm    Opiate dependence ([facility])    PONV (postoperative nausea and vomiting)    Port-A-Cath in place    PTSD (post-traumatic stress disorder)      PAST SURGICAL HISTORY: Past Surgical History:  Procedure Laterality Date   APPLICATION OF CRANIAL NAVIGATION N/A 08/14/2016   Procedure: APPLICATION OF CRANIAL NAVIGATION;  Surgeon: Erline Levine, MD;  Location: [facility];   Service: Neurosurgery;  Laterality: N/A;   BREAST RECONSTRUCTION Left    with silicone implant   COLONOSCOPY W/ POLYPECTOMY     CRANIOTOMY N/A 08/14/2016   Procedure: CRANIOTOMY TUMOR EXCISION WITH Lucky Rathke;  Surgeon: Erline Levine, MD;  Location: [facility];  Service: Neurosurgery;  Laterality: N/A;   FIBULA FRACTURE SURGERY Left    MASTECTOMY Left    RADIOLOGY WITH ANESTHESIA N/A 07/23/2016   Procedure: MRI OF BRAIN WITH AND WITHOUT;  Surgeon: Medication Radiologist, MD;  Location: [facility];  Service: Radiology;  Laterality: N/A;   RADIOLOGY WITH ANESTHESIA N/A 09/08/2016   Procedure: MRI OF BRAIN WITH AND WITHOUT CONTRAST;  Surgeon: Medication Radiologist, MD;  Location: [facility];  Service: Radiology;  Laterality: N/A;   RADIOLOGY WITH ANESTHESIA N/A 12/10/2016   Procedure: MRI OF BRAIN WITH AND WITHOUT;  Surgeon: Medication Radiologist, MD;  Location: [facility];  Service: Radiology;  Laterality: N/A;   RADIOLOGY WITH ANESTHESIA N/A 03/02/2017   Procedure: MRI of BRAIN W and W/OUT CONTRAST;  Surgeon: Medication Radiologist, MD;  Location: [facility];  Service: Radiology;  Laterality: N/A;   RADIOLOGY WITH ANESTHESIA N/A 07/29/2017   Procedure: RADIOLOGY WITH ANESTHESIA MRI OF BRAIN WITH AND WITHOUT CONTRAST;  Surgeon: Radiologist, Medication, MD;  Location: [facility];  Service: Radiology;  Laterality: N/A;   RADIOLOGY WITH ANESTHESIA  N/A 12/07/2017   Procedure: MRI WITH ANESTHESIA OF BRAIN WITH AND WITHOUT CONTRAST;  Surgeon: Radiologist, Medication, MD;  Location: [facility];  Service: Radiology;  Laterality: N/A;   RADIOLOGY WITH ANESTHESIA N/A 04/07/2018   Procedure: MRI OF BRAIN WITH AND WITHOUT CONTRAST;  Surgeon: Radiologist, Medication, MD;  Location: [facility];  Service: Radiology;  Laterality: N/A;   RADIOLOGY WITH ANESTHESIA N/A 08/23/2018   Procedure: MRI WITH ANESTHESIA OF THE BRAIN WITH AND WITHOUT;  Surgeon: Radiologist, Medication, MD;  Location: [facility];  Service: Radiology;  Laterality: N/A;    RADIOLOGY WITH ANESTHESIA N/A 01/24/2019   Procedure: MRI OF BRAIN WITH AND WITHOUT CONTRAST;  Surgeon: Radiologist, Medication, MD;  Location: [facility];  Service: Radiology;  Laterality: N/A;   right power port placement Right      FAMILY HISTORY Family History  Problem Relation Age of Onset   Diabetes Mother    Bipolar disorder Mother    CAD Father    The patient's father still living, age 76, in [place]. He had prostate cancer at some point in the past. The patient's mother died at age 26 from complications of diabetes. The patient had no brothers, 2 sisters. A paternal grandmother had lung cancer in the setting of tobacco abuse. There is no other history of cancer in the family to her knowledge  GYNECOLOGIC HISTORY:  No LMP recorded. Patient has had an ablation. Menarche approximately age 54. First live birth in 67. The patient is GX P2. She underwent endometrial ablation in 2016.  SOCIAL HISTORY: Updated August 2019 The patient is originally from [place]. She has lived in [place] before but more recently was in [place]. She is back here because she could not afford her rent in [place]. She is living here and a temporary situation. She is divorced. Her 2 children are [name] who lives in [place] and works as a Development worker, community, and [name] who also lives in [place] and works as a Catering manager. The patient has a grandchild, Arelia Longest, 60 years old as of July 2019, living in [place] with his mother. The patient also has a grandson born in February 2019, who also lives in [place]. The patient has not established herself with a local church yet.    ADVANCED DIRECTIVES: Not in place; at the 06/03/2016 visit the patient was given the appropriate forms to complete and notarize at her discretion   HEALTH MAINTENANCE: Social History   Tobacco Use   Smoking status: Current Every Day Smoker    Packs/day: 0.25    Years: 43.00    Pack years: 10.75    Types: Cigarettes    Smokeless tobacco: Never Used  Substance Use Topics   Alcohol use: No    Comment: no ETOH since 08/22/12   Drug use: No    Comment: states she's in recovery program for 7 years     Colonoscopy:  PAP:  Bone density:   Allergies  Allergen Reactions   Demerol [Meperidine Hcl] Itching and Nausea And Vomiting   Erythromycin Rash    Current Outpatient Medications on File Prior to Visit  Medication Sig Dispense Refill   anastrozole (ARIMIDEX) 1 MG tablet TAKE 1 TABLET BY MOUTH EVERY DAY (Patient taking differently: Take 1 mg by mouth daily. ) 90 tablet 1   Biotin 5 MG TABS Take 5 mg by mouth daily.      Cholecalciferol (VITAMIN D3) 50 MCG (2000 UT) capsule Take 2,000 Units by mouth daily.      cyclobenzaprine (FLEXERIL) 10  MG tablet TAKE 1 TABLET BY MOUTH TWICE A DAY (Patient taking differently: Take 10 mg by mouth 2 (two) times daily. ) 60 tablet 1   cycloSPORINE (RESTASIS) 0.05 % ophthalmic emulsion Place 1 drop into both eyes 2 (two) times daily.     docusate sodium (COLACE) 100 MG capsule Take 100 mg by mouth daily.      fluticasone (FLONASE) 50 MCG/ACT nasal spray Place 2 sprays into both nostrils daily. 16 g 4   gabapentin (NEURONTIN) 300 MG capsule Take 1-2 capsules (300-600 mg total) by mouth See admin instructions. Take 300 mg by mouth in the morning and take 600 mg by mouth at bedtime 90 capsule 0   ibuprofen (ADVIL) 800 MG tablet TAKE 1 TABLET BY MOUTH THREE TIMES A DAY (Patient taking differently: Take 800 mg by mouth daily. ) 90 tablet 0   lamoTRIgine (LAMICTAL) 100 MG tablet Take 100 mg by mouth every morning.     loratadine (CLARITIN) 10 MG tablet Take 1 tablet (10 mg total) by mouth daily. 30 tablet 6   Lurasidone HCl (LATUDA) 60 MG TABS Take 60 mg by mouth daily.     ondansetron (ZOFRAN) 8 MG tablet TAKE 1 TABLET (8 MG TOTAL) BY MOUTH EVERY 8 (EIGHT) HOURS AS NEEDED FOR NAUSEA OR VOMITING. (Patient taking differently: Take 8 mg by mouth 2 (two) times daily. )  90 tablet 1   pantoprazole (PROTONIX) 40 MG tablet Take 1 tablet (40 mg total) by mouth daily. 90 tablet 1   traZODone (DESYREL) 100 MG tablet Take 1 tablet (100 mg total) by mouth at bedtime. (Patient taking differently: Take 300 mg by mouth at bedtime. ) 30 tablet 0   No current facility-administered medications on file prior to visit.      OBJECTIVE:  Vitals:   07/05/19 1103  BP: 106/71  Pulse: 92  Resp: 19  Temp: 99.1 F (37.3 C)  TempSrc: Oral  SpO2: 97%  Weight: 156 lb (70.8 kg)  Height: _0  (1.651 m)  Body mass index is 25.96 kg/m.  ECOG FS: 1 - Symptomatic but completely ambulatory  GENERAL: Patient is a well appearing female in no acute distress HEENT:  Sclerae anicteric.  Oropharynx clear and moist. No ulcerations or evidence of oropharyngeal candidiasis. Neck is supple.  NODES:  No cervical, supraclavicular, or axillary lymphadenopathy palpated.  BREAST EXAM:  Deferred. LUNGS:  Clear to auscultation bilaterally.  No wheezes or rhonchi. HEART:  Regular rate and rhythm. No murmur appreciated. ABDOMEN:  Soft, nontender.  Positive, normoactive bowel sounds. No organomegaly palpated. MSK:  No focal spinal tenderness to palpation. Full range of motion bilaterally in the upper extremities. EXTREMITIES:  + swelling in left arm SKIN:  Clear with no obvious rashes or skin changes. Ecchymosis noted on right upper arm  No nail dyscrasia. NEURO:  Nonfocal. Well oriented.  Appropriate affect.    LAB RESULTS: No results found for: LABCA2  CBC    Component Value Date/Time   WBC 3.9 (L) 06/20/2019 0820   RBC 4.56 06/20/2019 0820   HGB 13.3 06/20/2019 0820   HGB 14.0 04/11/2019 0832   HGB 12.1 10/26/2017 1038   HCT 41.4 06/20/2019 0820   HCT 35.7 10/26/2017 1038   PLT 72 (L) 06/20/2019 0820   PLT 122 (L) 04/11/2019 0832   PLT 167 10/26/2017 1038   MCV 90.8 06/20/2019 0820   MCV 98.4 10/26/2017 1038   MCH 29.2 06/20/2019 0820   MCHC 32.1 06/20/2019 0820   RDW  14.5 06/20/2019 0820   RDW 13.3 10/26/2017 1038   LYMPHSABS 0.6 (L) 06/20/2019 0820   LYMPHSABS 0.5 (L) 10/26/2017 1038   MONOABS 0.3 06/20/2019 0820   MONOABS 0.1 10/26/2017 1038   EOSABS 0.0 06/20/2019 0820   EOSABS 0.0 10/26/2017 1038   BASOSABS 0.0 06/20/2019 0820   BASOSABS 0.0 10/26/2017 1038   CMP Latest Ref Rng & Units 06/20/2019 05/23/2019 05/02/2019  Glucose 70 - 99 mg/dL 99 98 94  BUN 6 - 20 mg/dL _0 Creatinine 0.44 - 1.00 mg/dL 0.99 1.06(H) 1.08(H)  Sodium 135 - 145 mmol/L 140 139 140  Potassium 3.5 - 5.1 mmol/L 3.7 3.6 4.0  Chloride 98 - 111 mmol/L 103 104 102  CO2 22 - 32 mmol/L _1 Calcium 8.9 - 10.3 mg/dL 9.2 9.1 9.8  Total Protein 6.5 - 8.1 g/dL 6.8 6.9 7.3  Total Bilirubin 0.3 - 1.2 mg/dL 0.3 0.3 0.3  Alkaline Phos 38 - 126 U/L 80 73 74  AST 15 - 41 U/L 39 47(H) 38  ALT 0 - 44 U/L 32 45(H) 36     STUDIES: No results found.  ELIGIBLE FOR AVAILABLE RESEARCH PROTOCOL: no  ASSESSMENT: 58 y.o. [place] woman with stage IV left-sided breast cancer involving bone and central nervous system  (1) s/p left breast lower inner quadrant biopsy 06/19/2015 for a clinical T2-3 NX invasive ductal carcinoma, grade 2, triple positive.  (2) status post left mastectomy and axillary lymph node dissection  with immediate expander placement 07/18/2015 for an mpT4 pN2,stage IIIB invasive ductal carcinoma, grade 3, with negative margins.  (a) definitive implant exchange to be scheduled in December   METASTATIC DISEASE: October 2016  (3) CT scan of the chest abdomen and pelvis  09/16/2015 shows metastatic lesions in the right scapula, left iliac crest, L4, and T spine. There were questionable liver cysts, with repeat CT scan 03/02/2016 showing possible right upper lobe lung lesions and possibly increased liver lesions  (a) CT scan of the chest 06/17/2016 shows no active disease in the lungs or liver  (b) Bone scan July 2017 showed no evidence of bony metastatic disease    (c) head CT 07/08/2016 showed a cerebellar lesion, confirmed by MRI 07/23/2016, status post craniotomy 08/14/2016, confirming a metastatic deposit which was estrogen and progesterone receptor negative, HER-2 amplified with a signals ratio of 7.16, number per cell 13.25  (d) CA 27-29 is not informative  (4) received docetaxel every 3 weeks 6 together with trastuzumab and pertuzumab, last docetaxel dose 02/11/2016  (5) adjuvant radiation7/03/2016 to 06/26/2016 at [facility]: 1. The Left chest wall was treated to 23.4 Gy in 13 fractions at 1.8 Gy per fraction. 2. The Left chest wall was boosted to 10 Gy in 5 fractions at 2 Gy per fraction. 3. The Left Sclav/PAB was treated to 23.4 Gy in 13 fractions at 1.8 Gy per fraction.  [Note: Including the patient's treatment in [place] (received 15 fractions per Dr. Maryan Rued near [place], [place]), the patient received 50.4 Gy to the left chest wall and supraclavicular region. ]  (6) started trastuzumab and pertuzumab October 2016, continuing every 3 weeks,  (a) echocardiogram 02/26/2016 showed a well preserved ejection fraction  (b) echocardiogram 07/01/2016 shows an ejection fraction in the 60-65%   (c) pertuzumab discontinued 10/2016 with uncontrolled diarrhea  (d) echocardiogram 11/11/2016 showed an ejection fraction in the 60-65%  (e) echocardiogram 03/03/2017 shows an ejection fraction of 60-65%  (f) echocardiogram on 05/19/2017 shows an ejection fraction  of 55-60%  (g) echocardiogram 09/24/2017 shows the ejection fraction in the 60-65%  (h) echocardiogram 02/14/2018 shows an ejection fraction in the 60-65%  (I) echocardiogram  06/30/2018 shows an ejection fraction in the 55-60%  (m) echocardiogram on 12/08/2018 shows an ejection fraction in the 60-65% range      (7) started denosumab/Xgeva October 2017 given every 4 weeks, transitioned to every 8 weeks beginning 10/11/18 (every 6 weeks while giving TDM1 every 3 weeks)  (8) started anastrozole  October 2017   (a) bone scan 11/10/2016 shows no active disease  (b) chest CT scan 11/10/2016 stable, with no evidence of active disease  (c) chest CT and bone scan 07/02/2017 show no evidence of active disease  (d) CT scan of the chest with contrast 11/10/2017 shows some left axillary edema, but no evidence of thrombosis or adenopathy in that area, 0.9 cm precarinal lymph and 0.7 cm right upper lobe nodule node; bone lesions stable  (e) CT of the chest 05/04/2018 shows a 1.4 cm right lower paratracheal node which is slightly increased and a new right prevascular mediastinal node measuring 0.7 cm.  Bone lesions are stable.  (f) chest CT on 07/01/2018 shows no definite findings of metastatic disease in the thorax. Previously noted borderline enlarged low right paratracheal lymph node is stable to slightly decreased in size   (e) chest CT on 12/30/2018 notes mild increase in right paratracheal adneopathy, recommended PET scan.  Pet scan on 01/19/2019 showed a hypermetabolic and enlarged right paratracheal lymph node consistent with breast cancer recurrence.  (f)Trastuzumab discontinued due to February 2020 scans   (g) TDM-1 started on 01/31/2019 given every 21 days.  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  (a) brain irradiation10/19/17-11/08/17: Whole brain/ 35 Gy in 14 fractions   (b) repeat brain MRI obtained 12/10/2016 shows no active disease in the brain  (c) repeat brain MRI 03/02/2017 shows no evidence of residual or recurrent disease  (d) repeat brain MRI 07/29/2017  shows no evidence of residual or recurrent disease  (e) repeat brain MRI 12/07/2017 shows no evidence of disease recurrence.  There is progressive white matter change secondary to prior treatment.  (f) repeat brain MRI 04/07/2018 showed no evidence of disease\  (g) repeat brain MRI on 08/23/2018 shows no evidence of disease  (h) repeat brain MRI on 01/25/2019 shows no evidence of disease   PLAN: [name] is doing well today.  She is tolerating her treatment well.  She does have some easy bruising, so I ordered CBC, PTT, and INR today.  I anticipate that these will be normal, however with her undergoing the MRI tomorrow with sedation/anesthesia, I want to be sure there are no unanticipated issues.  I recommended [place] wear her sleeve when she can to help with her lymphedema.  I placed another referral to PT to see if they can evaluate and make any other recommendations.    [name] will return in 2 weeks for labs, f/u, and her next appointments.  She was recommended to continue with the appropriate pandemic precautions. She knows to call for any questions  that may arise between now and her next appointment.  We are happy to see her sooner if needed.  A total of (20) minutes of face-to-face time was spent with this patient with greater than 50% of that time in counseling and care-coordination.    Wilber Bihari, NP  07/05/19 11:29 AM Medical Oncology and Hematology [facility] [address] [place],  [address] Tel. [phone]    Fax. [phone]

## 2019-07-06 ENCOUNTER — Encounter (HOSPITAL_COMMUNITY)
Admission: RE | Disposition: A | Discharge status: Home / Self Care | Payer: Self-pay | Source: Home / Self Care | Attending: Oncology

## 2019-07-06 ENCOUNTER — Ambulatory Visit (HOSPITAL_COMMUNITY)
Admission: RE | Admit: 2019-07-06 | Discharge: 2019-07-06 | Disposition: A | Discharge status: Home / Self Care | Payer: Medicaid Other | Source: Ambulatory Visit | Attending: Oncology | Admitting: Oncology

## 2019-07-06 ENCOUNTER — Ambulatory Visit (HOSPITAL_COMMUNITY): Payer: Medicaid Other | Admitting: Anesthesiology

## 2019-07-06 ENCOUNTER — Encounter (HOSPITAL_COMMUNITY): Payer: Self-pay

## 2019-07-06 ENCOUNTER — Ambulatory Visit (HOSPITAL_COMMUNITY)
Admission: RE | Admit: 2019-07-06 | Discharge: 2019-07-06 | Disposition: A | Discharge status: Home / Self Care | Payer: Medicaid Other | Attending: Oncology | Admitting: Oncology

## 2019-07-06 DIAGNOSIS — C50312 Malignant neoplasm of lower-inner quadrant of left female breast: Secondary | ICD-10-CM | POA: Diagnosis not present

## 2019-07-06 DIAGNOSIS — C7651 Malignant neoplasm of right lower limb: Secondary | ICD-10-CM | POA: Insufficient documentation

## 2019-07-06 DIAGNOSIS — C770 Secondary and unspecified malignant neoplasm of lymph nodes of head, face and neck: Secondary | ICD-10-CM | POA: Insufficient documentation

## 2019-07-06 DIAGNOSIS — F17200 Nicotine dependence, unspecified, uncomplicated: Secondary | ICD-10-CM | POA: Insufficient documentation

## 2019-07-06 DIAGNOSIS — Z85841 Personal history of malignant neoplasm of brain: Secondary | ICD-10-CM | POA: Insufficient documentation

## 2019-07-06 DIAGNOSIS — C50919 Malignant neoplasm of unspecified site of unspecified female breast: Secondary | ICD-10-CM

## 2019-07-06 DIAGNOSIS — K219 Gastro-esophageal reflux disease without esophagitis: Secondary | ICD-10-CM | POA: Diagnosis not present

## 2019-07-06 DIAGNOSIS — C7951 Secondary malignant neoplasm of bone: Secondary | ICD-10-CM

## 2019-07-06 DIAGNOSIS — Z8589 Personal history of malignant neoplasm of other organs and systems: Secondary | ICD-10-CM

## 2019-07-06 HISTORY — PX: RADIOLOGY WITH ANESTHESIA: SHX6223

## 2019-07-06 SURGERY — MRI WITH ANESTHESIA
Anesthesia: General

## 2019-07-06 MED ORDER — GADOBUTROL 1 MMOL/ML IV SOLN
7.0000 mL | Freq: Once | INTRAVENOUS | Status: AC | PRN
  Administered 2019-07-06: 7 mL via INTRAVENOUS

## 2019-07-06 NOTE — Anesthesia Preprocedure Evaluation (Signed)
Anesthesia Evaluation  Patient identified by MRN, date of birth, ID band Patient awake    Reviewed: Allergy & Precautions, NPO status , Patient's Chart, lab work & pertinent test results  History of Anesthesia Complications (+) PONV, Family history of anesthesia reaction and history of anesthetic complications  Airway Mallampati: II  TM Distance: >3 FB Neck ROM: Full    Dental no notable dental hx. (+) Dental Advisory Given, Poor Dentition, Missing   Pulmonary Current Smoker and Patient abstained from smoking.,    Pulmonary exam normal breath sounds clear to auscultation       Cardiovascular negative cardio ROS Normal cardiovascular exam Rhythm:Regular Rate:Normal     Neuro/Psych  Headaches, PSYCHIATRIC DISORDERS Anxiety Depression Bipolar Disorder  Neuromuscular disease    GI/Hepatic GERD  Medicated and Controlled,(+)       alcohol use,   Endo/Other  negative endocrine ROS  Renal/GU negative Renal ROS     Musculoskeletal  (+) Arthritis , Fibromyalgia -, narcotic dependent  Abdominal   Peds  Hematology negative hematology ROS (+) anemia ,   Anesthesia Other Findings Day of surgery medications reviewed with the patient.  Reproductive/Obstetrics  Metastatic breast cancer                             Anesthesia Physical  Anesthesia Plan  ASA: III  Anesthesia Plan: General   Post-op Pain Management:    Induction: Intravenous  PONV Risk Score and Plan: 3 and Treatment may vary due to age or medical condition, Ondansetron, Dexamethasone and Midazolam  Airway Management Planned: Oral ETT  Additional Equipment: None  Intra-op Plan:   Post-operative Plan: Extubation in OR  Informed Consent: I have reviewed the patients History and Physical, chart, labs and discussed the procedure including the risks, benefits and alternatives for the proposed anesthesia with the patient or  authorized representative who has indicated his/her understanding and acceptance.     Dental advisory given  Plan Discussed with: CRNA and Anesthesiologist  Anesthesia Plan Comments:         Anesthesia Quick Evaluation

## 2019-07-06 NOTE — Transfer of Care (Signed)
Immediate Anesthesia Transfer of Care Note  Patient: [name]  Procedure(s) Performed: MRI WITH ANESTHESIA OF BRAIN WITH AND WITHOUT CONTRAST (N/A )  Patient Location: PACU  Anesthesia Type:General  Level of Consciousness: awake, alert  and oriented  Airway & Oxygen Therapy: Patient Spontanous Breathing and Patient connected to nasal cannula oxygen  Post-op Assessment: Report given to RN, Post -op Vital signs reviewed and stable and Patient moving all extremities  Post vital signs: Reviewed and stable  Last Vitals:  Vitals Value Taken Time  BP 118/68 07/06/19 1231  Temp    Pulse 81 07/06/19 1234  Resp 13 07/06/19 1234  SpO2 99 % 07/06/19 1234  Vitals shown include unvalidated device data.  Last Pain:  Vitals:   07/06/19 0827  TempSrc:   PainSc: 0-No pain         Complications: No apparent anesthesia complications

## 2019-07-07 ENCOUNTER — Other Ambulatory Visit: Payer: Self-pay

## 2019-07-07 ENCOUNTER — Ambulatory Visit: Payer: Medicaid Other | Attending: Adult Health | Admitting: Rehabilitation

## 2019-07-07 ENCOUNTER — Encounter: Payer: Self-pay | Admitting: Rehabilitation

## 2019-07-07 DIAGNOSIS — M25612 Stiffness of left shoulder, not elsewhere classified: Secondary | ICD-10-CM | POA: Insufficient documentation

## 2019-07-07 DIAGNOSIS — M79602 Pain in left arm: Secondary | ICD-10-CM

## 2019-07-07 DIAGNOSIS — I972 Postmastectomy lymphedema syndrome: Secondary | ICD-10-CM | POA: Diagnosis present

## 2019-07-07 DIAGNOSIS — R293 Abnormal posture: Secondary | ICD-10-CM | POA: Diagnosis present

## 2019-07-07 NOTE — Therapy (Signed)
[place], [place], [address] Phone: [phone]   Fax:  [phone]  Physical Therapy Treatment  Patient Details  Name: [name] MRN: [id number] Date of Birth: 08/10/1961 Referring Provider (PT): Wilber Bihari, NP   Encounter Date: 07/07/2019  PT End of Session - 07/07/19 0940    Visit Number  1    Number of Visits  9    Date for PT Re-Evaluation  09/08/19    Authorization Type  medicaid    PT Start Time  0900    PT Stop Time  0940    PT Time Calculation (min)  40 min    Activity Tolerance  Patient tolerated treatment well    Behavior During Therapy  [facility] for tasks assessed/performed       Past Medical History:  Diagnosis Date  . Alcohol abuse   . Anemia    during chemo  . Anxiety    At age 52  . Arthritis Dx 2010  . Bipolar disorder ([place])   . Bronchitis   . Cancer ([place])    breast mets to brain  . Chronic pain   . Complication of anesthesia   . Depression   . Family history of adverse reaction to anesthesia    MOther had PONV  . Fibromyalgia Dx 2005  . GERD (gastroesophageal reflux disease)   . Headache    hx  migraines  . Lymphedema of left arm   . Opiate dependence ([place])   . PONV (postoperative nausea and vomiting)   . Port-A-Cath in place   . PTSD (post-traumatic stress disorder)     Past Surgical History:  Procedure Laterality Date  . APPLICATION OF CRANIAL NAVIGATION N/A 08/14/2016   Procedure: APPLICATION OF CRANIAL NAVIGATION;  Surgeon: Erline Levine, MD;  Location: [place] [facility]URO ORS;  Service: Neurosurgery;  Laterality: N/A;  . BREAST RECONSTRUCTION Left    with silicone implant  . COLONOSCOPY W/ POLYPECTOMY    . CRANIOTOMY N/A 08/14/2016   Procedure: CRANIOTOMY TUMOR EXCISION WITH Lucky Rathke;  Surgeon: Erline Levine, MD;  Location: [place] NEUR[facility];  Service: Neurosurgery;  Laterality: N/A;  . FIBULA FRACTURE SURGERY Left   . MASTECTOMY Left   . RADIOLOGY WITH ANESTHESIA N/A  07/23/2016   Procedure: MRI OF BRAIN WITH AND WITHOUT;  Surgeon: Medication Radiologist, MD;  Location: [place];  Service: Radiology;  Laterality: N/A;  . RADIOLOGY WITH ANESTHESIA N/A 09/08/2016   Procedure: MRI OF BRAIN WITH AND WITHOUT CONTRAST;  Surgeon: Medication Radiologist, MD;  Location: [place];  Service: Radiology;  Laterality: N/A;  . RADIOLOGY WITH ANESTHESIA N/A 12/10/2016   Procedure: MRI OF BRAIN WITH AND WITHOUT;  Surgeon: Medication Radiologist, MD;  Location: [place];  Service: Radiology;  Laterality: N/A;  . RADIOLOGY WITH ANESTHESIA N/A 03/02/2017   Procedure: MRI of BRAIN W and W/OUT CONTRAST;  Surgeon: Medication Radiologist, MD;  Location: [place];  Service: Radiology;  Laterality: N/A;  . RADIOLOGY WITH ANESTHESIA N/A 07/29/2017   Procedure: RADIOLOGY WITH ANESTHESIA MRI OF BRAIN WITH AND WITHOUT CONTRAST;  Surgeon: Radiologist, Medication, MD;  Location: [place];  Service: Radiology;  Laterality: N/A;  . RADIOLOGY WITH ANESTHESIA N/A 12/07/2017   Procedure: MRI WITH ANESTHESIA OF BRAIN WITH AND WITHOUT CONTRAST;  Surgeon: Radiologist, Medication, MD;  Location: [place];  Service: Radiology;  Laterality: N/A;  . RADIOLOGY WITH ANESTHESIA N/A 04/07/2018   Procedure: MRI OF BRAIN WITH AND WITHOUT CONTRAST;  Surgeon: Radiologist, Medication, MD;  Location: [place]  [facility];  Service: Radiology;  Laterality: N/A;  . RADIOLOGY WITH ANESTHESIA N/A 08/23/2018   Procedure: MRI WITH ANESTHESIA OF THE BRAIN WITH AND WITHOUT;  Surgeon: Radiologist, Medication, MD;  Location: [place];  Service: Radiology;  Laterality: N/A;  . RADIOLOGY WITH ANESTHESIA N/A 01/24/2019   Procedure: MRI OF BRAIN WITH AND WITHOUT CONTRAST;  Surgeon: Radiologist, Medication, MD;  Location: [place];  Service: Radiology;  Laterality: N/A;  . right power port placement Right     There were no vitals filed for this visit.  Subjective Assessment - 07/07/19 0903    Subjective  My arm has gotten bigger quickly a couple months after that. I never  got sleeves from [facility].  I have a pump and a night sleeve.  I never wear a sleeve during the day.    Pertinent History  Treated here in March of last year currently with a reid sleeve, day sleeve and compression pump. Lt mastectomy and ALND with reconstruction 07/08/15 for T4N2 stage IIIB IDC. Metastatic disease in the brain s/p craniotomy 08/14/16. Chemo completed in 2017 Trastuzumab and Pertuzumab, docetaxel. Radiation completed 2017. Stable bone lesions to the Rt scapula, Lt iliac crest, L4, Tspine. Also probable in the liver and lungs. Pt is also bipolar and a recovered drug and alchohol user.  Current treatment TDM-1, denosumab and anastrozole.    Limitations  Other (comment)   reports none   Patient Stated Goals  get the swelling down    Currently in Pain?  No/denies   oncea week it hurts on the top        [facility] PT Assessment - 07/07/19 0001      Assessment   Medical Diagnosis  Lt breast cancer    Referring Provider (PT)  Wilber Bihari, NP    Onset Date/Surgical Date  07/08/15    Hand Dominance  Right    Prior Therapy  yes      Precautions   Precaution Comments  lymphedema, bones mets      Restrictions   Weight Bearing Restrictions  No      Balance Screen   Has the patient fallen in the past 6 months  No    Has the patient had a decrease in activity level because of a fear of falling?   No    Is the patient reluctant to leave their home because of a fear of falling?   No      Home Film/video editor residence    Living Arrangements  Other (Comment)    Available Help at Discharge  Friend(s)      Prior Function   Level of Independence  Independent    Vocation  Part time employment    Vocation Requirements  mowing    Leisure  limited now; normally see family      Cognition   Overall Cognitive Status  Within Functional Limits for tasks assessed      Observation/Other Assessments   Observations  Lt arm larger      Sensation   Light Touch  Appears  Intact      Coordination   Gross Motor Movements are Fluid and Coordinated  Yes      Posture/Postural Control   Posture/Postural Control  Postural limitations    Postural Limitations  Rounded Shoulders;Forward head      ROM / Strength   AROM / PROM / Strength  AROM      AROM   Overall AROM Comments  WNL bilaterally with pull in the shoulders with behind the head and behind the back      Palpation   Palpation comment  3+ pitting edema lower arm 1+ upper arm ; firm skin especially back of the upper arm        LYMPHEDEMA/ONCOLOGY QUESTIONNAIRE - 07/07/19 0913      Type   Cancer Type  Left breast cancer      Surgeries   Mastectomy Date  08/21/15    Axillary Lymph Node Dissection Date  08/21/15    Number Lymph Nodes Removed  20      Date Lymphedema/Swelling Started   Date  11/06/17      Treatment   Active Chemotherapy Treatment  Yes    Date  --   every 5 weeks    Past Chemotherapy Treatment  Yes    Active Radiation Treatment  No    Past Radiation Treatment  Yes    Current Hormone Treatment  Yes      What other symptoms do you have   Are you Having Heaviness or Tightness  Yes    Are you having Pain  Yes    Are you having pitting edema  Yes    Is it Hard or Difficult finding clothes that fit  No      Lymphedema Assessments   Lymphedema Assessments  Upper extremities      Right Upper Extremity Lymphedema   10 cm Proximal to Olecranon Process  26 cm    Olecranon Process  23 cm    10 cm Proximal to Ulnar Styloid Process  18.8 cm    Just Proximal to Ulnar Styloid Process  14.6 cm    Across Hand at [employer]  18.4 cm    At [place] of 2nd Digit  6.2 cm      Left Upper Extremity Lymphedema   15 cm Proximal to Olecranon Process  33.5 cm    10 cm Proximal to Olecranon Process  34.5 cm    Olecranon Process  28.8 cm    15 cm Proximal to Ulnar Styloid Process  27.2 cm    10 cm Proximal to Ulnar Styloid Process  22.8 cm    Just Proximal to Ulnar Styloid Process  16.1  cm    Across Hand at [employer]  19 cm    At [place] of 2nd Digit  5.8 cm    Other  9                        PT Education - 07/07/19 0939    Education Details  POC    Person(s) Educated  Patient    Methods  Explanation    Comprehension  Verbalized understanding          PT Long Term Goals - 07/07/19 0944      PT LONG TERM GOAL #1   Title  Reduce left arm circumference at 10 cm proximal to her olecranon to </= 30 cm.    Baseline  baseline 34.5cm    Time  8    Period  Weeks    Status  New      PT LONG TERM GOAL #2   Title  Reduce left arm circumference at 10 cm proximal to her ulnar styloid process to </= 20 cm.    Baseline  22.8    Time  8    Period  Weeks    Status  New      PT LONG TERM GOAL #3   Title  Pt will be ind with lymphedema self care to include pump use or MLD and appropriate garments    Time  8    Period  Weeks    Status  New            Plan - 07/07/19 0940    Clinical Impression Statement  Pt declines wrapping but may think about it.  Currently has a day sleeve, reid sleeve, and pump but is not using any of it.  Size of the Lt UE has increased mainly 10cm proximal in the forearm and upper arm with moderate to significant fibrosis and pitting posteriorly.  Pt works in the sun all day mowing which has most likely increased swelling.  Pt is agreeable to getting a circaid reducation kit and will [facility] at [facility] and return here for use of that with MLD.  Encouraged to use the pump at home every day to make sure it is working.    Personal Factors and Comorbidities  Behavior Pattern;Past/Current Experience;Time since onset of injury/illness/exacerbation;Comorbidity 2    Comorbidities  radiation and all lymph nodes removed    Examination-Activity Limitations  --   reports none   Stability/Clinical Decision Making  Evolving/Moderate complexity    Clinical Decision Making  Low    Rehab Potential  Fair   declines bandaging and  poor compliance   PT Frequency  1x / week    PT Duration  8 weeks    PT Treatment/Interventions  ADLs/Self Care Home Management;Manual techniques;Manual lymph drainage;Compression bandaging;Passive range of motion;Therapeutic exercise;Taping    PT Next Visit Plan  get circaid? fit this.  MLD Lt UE, try pump more?    Consulted and Agree with Plan of Care  Patient       Patient will benefit from skilled therapeutic intervention in order to improve the following deficits and impairments:  Decreased knowledge of use of DME, Decreased skin integrity, Increased edema  Visit Diagnosis: 1. Postmastectomy lymphedema   2. Abnormal posture   3. Pain in left arm        Problem List Patient Active Problem List   Diagnosis Date Noted  . Port-A-Cath in place 06/20/2019  . 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 S/P mastectomy, left 12/09/2018  . S/P breast reconstruction, left 12/09/2018  . S/P radiation therapy 12/09/2018  . GERD (gastroesophageal reflux disease) 12/10/2017  . Edema 11/09/2017  . Sensorineural hearing loss (SNHL) of both ears 11/05/2017  . Vitamin D deficiency 01/18/2017  . Bipolar I disorder, most recent episode depressed ([place]) 12/08/2016  . Adjustment disorder with anxiety 12/06/2016  . Metastatic breast cancer ([place])   . History of cancer metastatic to brain 07/27/2016  . Iron deficiency anemia 06/26/2016  . Bone metastases ([place]) 06/03/2016  . Primary cancer of lower-inner quadrant of left female breast ([place]) 06/01/2016  . Current smoker 03/28/2015  . Seasonal allergies 03/28/2015  . Anxiety state 02/28/2015  . Fibromyalgia 02/28/2015  . Family history of diabetes mellitus 02/28/2015  . H/O alcohol abuse     Stark Bray 07/07/2019, 9:47 AM  [place], [place], [address] Phone: [phone]   Fax:  [phone]  Name: [name] MRN:  [id number] Date of Birth: 1961/07/14

## 2019-07-07 NOTE — Patient Instructions (Signed)
Call your MD to get a prescription faxed to [facility] to get a circaid reduction kit Use your pump every day

## 2019-07-10 ENCOUNTER — Ambulatory Visit (HOSPITAL_COMMUNITY): Admission: RE | Admit: 2019-07-10 | Payer: Medicaid Other | Source: Ambulatory Visit

## 2019-07-10 MED FILL — Propofol IV Emul 200 MG/20ML (10 MG/ML): INTRAVENOUS | Qty: 20 | Status: AC

## 2019-07-10 MED FILL — Sugammadex Sodium IV 200 MG/2ML (Base Equivalent): INTRAVENOUS | Qty: 2 | Status: AC

## 2019-07-10 MED FILL — Rocuronium Bromide IV Soln 50 MG/5ML (10 MG/ML): INTRAVENOUS | Qty: 5 | Status: AC

## 2019-07-10 MED FILL — Phenylephrine-NaCl Pref Syr 0.4 MG/10ML-0.9% (40 MCG/ML): INTRAVENOUS | Qty: 10 | Status: AC

## 2019-07-10 MED FILL — Ondansetron HCl Inj 4 MG/2ML (2 MG/ML): INTRAMUSCULAR | Qty: 2 | Status: AC

## 2019-07-10 MED FILL — Fentanyl Citrate Preservative Free (PF) Inj 100 MCG/2ML: INTRAMUSCULAR | Qty: 2 | Status: AC

## 2019-07-10 MED FILL — Dexamethasone Sodium Phosphate Inj 4 MG/ML: INTRAMUSCULAR | Qty: 1 | Status: AC

## 2019-07-10 MED FILL — Lactated Ringer's Solution: INTRAVENOUS | Qty: 1000 | Status: AC

## 2019-07-10 MED FILL — Midazolam HCl Inj 2 MG/2ML (Base Equivalent): INTRAMUSCULAR | Qty: 2 | Status: AC

## 2019-07-10 MED FILL — Lidocaine HCl Local Soln Prefilled Syringe 100 MG/5ML (2%): INTRAMUSCULAR | Qty: 5 | Status: AC

## 2019-07-11 ENCOUNTER — Inpatient Hospital Stay (HOSPITAL_BASED_OUTPATIENT_CLINIC_OR_DEPARTMENT_OTHER): Payer: Medicaid Other | Admitting: Internal Medicine

## 2019-07-11 DIAGNOSIS — Z85841 Personal history of malignant neoplasm of brain: Secondary | ICD-10-CM

## 2019-07-11 DIAGNOSIS — C7931 Secondary malignant neoplasm of brain: Secondary | ICD-10-CM | POA: Diagnosis not present

## 2019-07-11 DIAGNOSIS — Z8589 Personal history of malignant neoplasm of other organs and systems: Secondary | ICD-10-CM

## 2019-07-11 NOTE — Progress Notes (Signed)
I connected with [name] on 07/11/19 at 12:00 PM EDT by telephone visit and verified that I am speaking with the correct person using two identifiers.  I discussed the limitations, risks, security and privacy concerns of performing an evaluation and management service by telemedicine and the availability of in-person appointments. I also discussed with the patient that there may be a patient responsible charge related to this service. The patient expressed understanding and agreed to proceed.  Other persons participating in the visit and their role in the encounter:  n/a  Patient's location:  Home  Provider's location:  Office  Chief Complaint:  Brain Metastases  History of Present Ilness: No new or progressive neurologic deficits.  Headaches are sporadic and stable.  Denies seizures. Observations: Cognition and language normal  Imaging:  [facility] Clinician Interpretation: I have personally reviewed the CNS images as listed.  My interpretation, in the context of the patient's clinical presentation, is stable disease  [name] Wo Contrast  Result Date: 07/06/2019 CLINICAL DATA:  Metastatic breast cancer. History of left cerebellar metastasis status post resection and postoperative whole-brain radiation. EXAM: MRI HEAD WITHOUT AND WITH CONTRAST TECHNIQUE: Multiplanar, multiecho pulse sequences of the brain and surrounding structures were obtained without and with intravenous contrast. CONTRAST:  7 mL Gadavist COMPARISON:  01/24/2019 FINDINGS: Brain: There is no evidence of acute infarct, mass, midline shift, or extra-axial fluid collection. A chronic microhemorrhage in the right parietal lobe is likely unchanged though more conspicuous on today's study due to imaging at 3 T. Sequelae of left suboccipital craniectomy are again identified with unchanged small region of encephalomalacia in the left cerebellum. Confluent T2 hyperintensity throughout the cerebral white matter bilaterally is unchanged and  compatible with post radiation sequela. No abnormal brain parenchymal or meningeal enhancement suggestive of recurrent metastatic disease is evident. There is mild-to-moderate cerebral atrophy. Vascular: Major intracranial vascular flow voids are preserved. Skull and upper cervical spine: Left suboccipital craniectomy. No suspicious marrow lesion. Sinuses/Orbits: Unremarkable orbits. Trace bilateral mastoid fluid. Clear paranasal sinuses. Other: None. IMPRESSION: Stable post treatment changes without evidence of recurrent metastatic disease. Electronically Signed   By: Logan Bores M.D.   On: 07/06/2019 15:54    Assessment and Plan: Clinically and radiographically stable Follow Up Instructions: Follow up via telephone after repeat MRI brain in 6 months  I discussed the assessment and treatment plan with the patient.  The patient was provided an opportunity to ask questions and all were answered.  The patient agreed with the plan and demonstrated understanding of the instructions.    The patient was advised to call back or seek an in-person evaluation if the symptoms worsen or if the condition fails to improve as anticipated.  I provided 5-10 minutes of non-face-to-face time during this enocunter.  Ventura Sellers, MD   I provided 10 minutes of non face-to-face telephone visit time during this encounter, and > 50% was spent counseling as documented under my assessment & plan.

## 2019-07-12 ENCOUNTER — Telehealth: Payer: Self-pay | Admitting: Internal Medicine

## 2019-07-12 NOTE — Telephone Encounter (Signed)
Scheduled appt per 8/11 los.  Spoke with patient and she is aware of the appt date and time.

## 2019-07-13 ENCOUNTER — Ambulatory Visit (HOSPITAL_COMMUNITY): Payer: Medicaid Other

## 2019-07-13 ENCOUNTER — Telehealth: Payer: Self-pay

## 2019-07-13 DIAGNOSIS — C770 Secondary and unspecified malignant neoplasm of lymph nodes of head, face and neck: Secondary | ICD-10-CM

## 2019-07-13 DIAGNOSIS — Z8589 Personal history of malignant neoplasm of other organs and systems: Secondary | ICD-10-CM

## 2019-07-13 DIAGNOSIS — Z85841 Personal history of malignant neoplasm of brain: Secondary | ICD-10-CM

## 2019-07-13 DIAGNOSIS — C50312 Malignant neoplasm of lower-inner quadrant of left female breast: Secondary | ICD-10-CM

## 2019-07-13 DIAGNOSIS — C7951 Secondary malignant neoplasm of bone: Secondary | ICD-10-CM

## 2019-07-13 NOTE — Telephone Encounter (Signed)
Message received that PET scan was denied.  RN reviewed with MD.  MD recommendations to obtain CT Chest with Contrast, and Bone Scan.  Will place orders, and send message to obtain approval.

## 2019-07-18 ENCOUNTER — Other Ambulatory Visit: Payer: Self-pay

## 2019-07-18 ENCOUNTER — Inpatient Hospital Stay: Payer: Medicaid Other

## 2019-07-18 ENCOUNTER — Inpatient Hospital Stay (HOSPITAL_BASED_OUTPATIENT_CLINIC_OR_DEPARTMENT_OTHER): Payer: Medicaid Other | Admitting: Adult Health

## 2019-07-18 ENCOUNTER — Encounter: Payer: Self-pay | Admitting: Adult Health

## 2019-07-18 ENCOUNTER — Telehealth: Payer: Self-pay

## 2019-07-18 VITALS — BP 134/81 | HR 89 | Temp 98.1°F | Resp 18 | Ht 65.0 in | Wt 152.0 lb

## 2019-07-18 VITALS — BP 130/85 | HR 85 | Temp 98.3°F | Resp 17

## 2019-07-18 DIAGNOSIS — C7951 Secondary malignant neoplasm of bone: Secondary | ICD-10-CM

## 2019-07-18 DIAGNOSIS — Z5112 Encounter for antineoplastic immunotherapy: Secondary | ICD-10-CM | POA: Diagnosis not present

## 2019-07-18 DIAGNOSIS — C50919 Malignant neoplasm of unspecified site of unspecified female breast: Secondary | ICD-10-CM

## 2019-07-18 DIAGNOSIS — C50312 Malignant neoplasm of lower-inner quadrant of left female breast: Secondary | ICD-10-CM

## 2019-07-18 DIAGNOSIS — F411 Generalized anxiety disorder: Secondary | ICD-10-CM

## 2019-07-18 DIAGNOSIS — Z95828 Presence of other vascular implants and grafts: Secondary | ICD-10-CM

## 2019-07-18 LAB — CBC WITH DIFFERENTIAL/PLATELET
Abs Immature Granulocytes: 0.03 10*3/uL (ref 0.00–0.07)
Basophils Absolute: 0 10*3/uL (ref 0.0–0.1)
Basophils Relative: 1 %
Eosinophils Absolute: 0 10*3/uL (ref 0.0–0.5)
Eosinophils Relative: 0 %
HCT: 39.9 % (ref 36.0–46.0)
Hemoglobin: 13 g/dL (ref 12.0–15.0)
Immature Granulocytes: 1 %
Lymphocytes Relative: 21 %
Lymphs Abs: 0.8 10*3/uL (ref 0.7–4.0)
MCH: 29.1 pg (ref 26.0–34.0)
MCHC: 32.6 g/dL (ref 30.0–36.0)
MCV: 89.5 fL (ref 80.0–100.0)
Monocytes Absolute: 0.3 10*3/uL (ref 0.1–1.0)
Monocytes Relative: 8 %
Neutro Abs: 2.6 10*3/uL (ref 1.7–7.7)
Neutrophils Relative %: 69 %
Platelets: 54 10*3/uL — ABNORMAL LOW (ref 150–400)
RBC: 4.46 MIL/uL (ref 3.87–5.11)
RDW: 14.6 % (ref 11.5–15.5)
WBC: 3.7 10*3/uL — ABNORMAL LOW (ref 4.0–10.5)
nRBC: 0 % (ref 0.0–0.2)

## 2019-07-18 LAB — COMPREHENSIVE METABOLIC PANEL
ALT: 22 U/L (ref 0–44)
AST: 34 U/L (ref 15–41)
Albumin: 3.7 g/dL (ref 3.5–5.0)
Alkaline Phosphatase: 81 U/L (ref 38–126)
Anion gap: 10 (ref 5–15)
BUN: 11 mg/dL (ref 6–20)
CO2: 27 mmol/L (ref 22–32)
Calcium: 8.8 mg/dL — ABNORMAL LOW (ref 8.9–10.3)
Chloride: 105 mmol/L (ref 98–111)
Creatinine, Ser: 0.88 mg/dL (ref 0.44–1.00)
GFR calc Af Amer: >60 mL/min (ref >60)
GFR calc non Af Amer: >60 mL/min (ref >60)
Glucose, Bld: 94 mg/dL (ref 70–99)
Potassium: 3.7 mmol/L (ref 3.5–5.1)
Sodium: 142 mmol/L (ref 135–145)
Total Bilirubin: 0.4 mg/dL (ref 0.3–1.2)
Total Protein: 6.7 g/dL (ref 6.5–8.1)

## 2019-07-18 MED ORDER — SODIUM CHLORIDE 0.9% FLUSH
10.0000 mL | INTRAVENOUS | Status: DC | PRN
  Administered 2019-07-18: 10 mL
  Filled 2019-07-18: qty 10

## 2019-07-18 MED ORDER — SODIUM CHLORIDE 0.9 % IV SOLN
Freq: Once | INTRAVENOUS | Status: AC
  Administered 2019-07-18: 11:00:00 via INTRAVENOUS
  Filled 2019-07-18: qty 250

## 2019-07-18 MED ORDER — HEPARIN SOD (PORK) LOCK FLUSH 100 UNIT/ML IV SOLN
500.0000 [IU] | Freq: Once | INTRAVENOUS | Status: AC | PRN
  Administered 2019-07-18: 500 [IU]
  Filled 2019-07-18: qty 5

## 2019-07-18 MED ORDER — SODIUM CHLORIDE 0.9 % IV SOLN
260.0000 mg | Freq: Once | INTRAVENOUS | Status: AC
  Administered 2019-07-18: 260 mg via INTRAVENOUS
  Filled 2019-07-18: qty 5

## 2019-07-18 MED ORDER — DIPHENHYDRAMINE HCL 25 MG PO CAPS: 25.0000 mg | ORAL_CAPSULE | Freq: Once | ORAL | Status: DC

## 2019-07-18 MED ORDER — ACETAMINOPHEN 325 MG PO TABS: 650.0000 mg | ORAL_TABLET | Freq: Once | ORAL | Status: DC

## 2019-07-18 NOTE — Progress Notes (Signed)
Patient refused pre-meds (Tylenol 650mg  and Benadryl 25mg ) as well as 30 minute post observation period even after multiple requests by RN. Patient stated "she nevers waits" after completion of medication. Vitals stable.

## 2019-07-18 NOTE — Telephone Encounter (Signed)
OK to treat with Platelet count of 54 per Dr. [name].

## 2019-07-18 NOTE — Progress Notes (Signed)
[facility]  [phone] [phone]     ID: [name] DOB: 06/22/1961  MR#: [id number]  BDZ#:[id number]  Patient Care Team: Charlott Rakes, MD as PCP - General (Family Medicine) Kyung Rudd, MD as Consulting Physician (Radiation Oncology) Erline Levine, MD as Consulting Physician (Neurosurgery) Corena Pilgrim, MD as Consulting Physician (Psychiatry) Mickeal Skinner Acey Lav, MD as Consulting Physician (Psychiatry) Nehemiah Settle, MD as Referring Physician (Plastic Surgery) Irene Limbo, MD as Consulting Physician (Plastic Surgery) Dillingham, Loel Lofty, DO as Attending Physician (Plastic Surgery)   CHIEF COMPLAINT: Metastatic triple positive breast cancer  CURRENT TREATMENT: T-DM1, denosumab, anastrozole   INTERVAL HISTORY: [name] returns today for follow-up and treatment of her metastatic triple positive breast cancer.   She continues on TDM-1. She says she is tolerating this well, other than the decreased platelet count that has occurred since starting it.    She also continues on denosumab. She has no issues with this.  She also continues on anastrozole. She is taking this daily with good tolerance.  [name]'s last echocardiogram on 05/01/2019, showed an ejection fraction in the 60% - 65% range. She has these every 6 months.    Since her last visit, she underwent brain MRI a couple of weeks ago that showed no progression or recurrent disease in the brain.  She also had f/u with Dr. Mickeal Skinner which recommended repeat MRI in 6 months.    REVIEW OF SYSTEMS: [name] has left arm swelling.  She was re referred back to PT for evaluation and treatment.  She denies any new pain.  She has no fever, chills, cough, shortness of breath, chest pain or palpitations.  She is without nausea, vomiting, bowel/bladder changes.  She has no mucositis, easy bruising/bleeding, dysphagia, lymphadenopathy, or any other concerns.  She remains active doing yard work for her  job. A detailed ROS was otherwise non contributory.      BREAST CANCER HISTORY: From the original intake note:  Dovie was aware of a "lemon sized lump in" her left axilla for about a year before bringing it to medical attention. By then she had developed left breast and left axillary swelling (June 2016). She presented to the local emergency room and had a chest CT scan 06/06/2015 which showed a nodule in the left breast measuring 0.9 cm and questionable left axillary adenopathy. She then proceeded to bilateral diagnostic mammography and left breast ultrasonography 06/19/2015. There were no prior films for comparison (last mammography 12 years prior).. The breast density was category C. Mammography showed in the left breast upper inner quadrant a 7 cm area including a small mass and significant pleomorphic calcifications. Ultrasonography defined the mass as measuring 1.2 cm. The left axilla appeared unremarkable. There was significant skin edema.  Biopsy of the left breast mass 06/19/2015 showed ([id number]) an invasive ductal carcinoma, grade 2, estrogen receptor 83% positive, progesterone receptor 26% positive, and HER-2 amplified by immunohistochemstry with a 3+ reading. The patient had biopsies of a separate area in the left breast August of the same year and this showed atypical ductal hyperplasia. (SP [id number]).  Accordingly after appropriate discussion on 08/21/2015 the patient proceeded to left mastectomy with left axillary sentinel lymph node sampling, which, since the lymph nodes were positive, extended to the procedure to left axillary lymph node dissection. The pathology ([id number]) showed an invasive ductal carcinoma, grade 3, measuring in excess of 9 cm. There were also skin satellites, not contiguous with the invasive carcinoma. Margins were clear and ample.  There was evidence of lymphovascular invasion. A total of 15 lymph nodes were removed, including 5 sentinel lymph nodes, all of  which were positive, so that the final total was 14 out of 15 lymph nodes involved by tumor. There was evidence of extranodal extension. The final pathology was pT4b pN2a, stage IIIB  CA-27-29 and CEA 09/19/2015 were non-informative October 2016.  Unfortunately CT scans of the chest abdomen and pelvis 09/16/2015 showed bony metastases to the ri/ght scapula, left iliac crest, and also L4 and T-spine. There were questionable liver cysts which on repeat CT scan 03/02/2016 appear to be a little bit more well-defined, possibly a little larger. There were also some possible right upper lobe lung lesions.  Adjuvant treatment consisted of docetaxel, trastuzumab and pertuzumab, with the final (6th) docetaxel dose given 02/11/2016. She continues on trastuzumab and pertuzumab, with the 11th cycle given 05/05/2016. Echocardiogram 02/26/2016 showed an ejection fraction of 55%. She receives denosumab/Xgeva every 4 weeks.. She also receives radiation, started 06/09/201, to be completed 06/26/2016.  Her subsequent history is as detailed below   PAST MEDICAL HISTORY: Past Medical History:  Diagnosis Date   Alcohol abuse    Anemia    during chemo   Anxiety    At age 34   Arthritis Dx 2010   Bipolar disorder ([facility])    Bronchitis    Cancer ([facility])    breast mets to brain   Chronic pain    Complication of anesthesia    Depression    Family history of adverse reaction to anesthesia    MOther had PONV   Fibromyalgia Dx 2005   GERD (gastroesophageal reflux disease)    Headache    hx  migraines   Lymphedema of left arm    Opiate dependence ([facility])    PONV (postoperative nausea and vomiting)    Port-A-Cath in place    PTSD (post-traumatic stress disorder)      PAST SURGICAL HISTORY: Past Surgical History:  Procedure Laterality Date   APPLICATION OF CRANIAL NAVIGATION N/A 08/14/2016   Procedure: APPLICATION OF CRANIAL NAVIGATION;  Surgeon: Erline Levine, MD;  Location: [facility];   Service: Neurosurgery;  Laterality: N/A;   BREAST RECONSTRUCTION Left    with silicone implant   COLONOSCOPY W/ POLYPECTOMY     CRANIOTOMY N/A 08/14/2016   Procedure: CRANIOTOMY TUMOR EXCISION WITH Lucky Rathke;  Surgeon: Erline Levine, MD;  Location: [facility];  Service: Neurosurgery;  Laterality: N/A;   FIBULA FRACTURE SURGERY Left    MASTECTOMY Left    RADIOLOGY WITH ANESTHESIA N/A 07/23/2016   Procedure: MRI OF BRAIN WITH AND WITHOUT;  Surgeon: Medication Radiologist, MD;  Location: [facility];  Service: Radiology;  Laterality: N/A;   RADIOLOGY WITH ANESTHESIA N/A 09/08/2016   Procedure: MRI OF BRAIN WITH AND WITHOUT CONTRAST;  Surgeon: Medication Radiologist, MD;  Location: [facility];  Service: Radiology;  Laterality: N/A;   RADIOLOGY WITH ANESTHESIA N/A 12/10/2016   Procedure: MRI OF BRAIN WITH AND WITHOUT;  Surgeon: Medication Radiologist, MD;  Location: [facility];  Service: Radiology;  Laterality: N/A;   RADIOLOGY WITH ANESTHESIA N/A 03/02/2017   Procedure: MRI of BRAIN W and W/OUT CONTRAST;  Surgeon: Medication Radiologist, MD;  Location: [facility];  Service: Radiology;  Laterality: N/A;   RADIOLOGY WITH ANESTHESIA N/A 07/29/2017   Procedure: RADIOLOGY WITH ANESTHESIA MRI OF BRAIN WITH AND WITHOUT CONTRAST;  Surgeon: Radiologist, Medication, MD;  Location: [facility];  Service: Radiology;  Laterality: N/A;   RADIOLOGY WITH ANESTHESIA N/A 12/07/2017  Procedure: MRI WITH ANESTHESIA OF BRAIN WITH AND WITHOUT CONTRAST;  Surgeon: Radiologist, Medication, MD;  Location: [facility];  Service: Radiology;  Laterality: N/A;   RADIOLOGY WITH ANESTHESIA N/A 04/07/2018   Procedure: MRI OF BRAIN WITH AND WITHOUT CONTRAST;  Surgeon: Radiologist, Medication, MD;  Location: [facility];  Service: Radiology;  Laterality: N/A;   RADIOLOGY WITH ANESTHESIA N/A 08/23/2018   Procedure: MRI WITH ANESTHESIA OF THE BRAIN WITH AND WITHOUT;  Surgeon: Radiologist, Medication, MD;  Location: [facility];  Service: Radiology;  Laterality: N/A;    RADIOLOGY WITH ANESTHESIA N/A 01/24/2019   Procedure: MRI OF BRAIN WITH AND WITHOUT CONTRAST;  Surgeon: Radiologist, Medication, MD;  Location: [facility];  Service: Radiology;  Laterality: N/A;   RADIOLOGY WITH ANESTHESIA N/A 07/06/2019   Procedure: MRI WITH ANESTHESIA OF BRAIN WITH AND WITHOUT CONTRAST;  Surgeon: Radiologist, Medication, MD;  Location: [facility];  Service: Radiology;  Laterality: N/A;   right power port placement Right      FAMILY HISTORY Family History  Problem Relation Age of Onset   Diabetes Mother    Bipolar disorder Mother    CAD Father    The patient's father still living, age 18, in [place]. He had prostate cancer at some point in the past. The patient's mother died at age 13 from complications of diabetes. The patient had no brothers, 2 sisters. A paternal grandmother had lung cancer in the setting of tobacco abuse. There is no other history of cancer in the family to her knowledge  GYNECOLOGIC HISTORY:  No LMP recorded. Patient has had an ablation. Menarche approximately age 2. First live birth in 7. The patient is GX P2. She underwent endometrial ablation in 2016.  SOCIAL HISTORY: Updated August 2019 The patient is originally from [place]. She has lived in [place] before but more recently was in [place]. She is back here because she could not afford her rent in [place]. She is living here and a temporary situation. She is divorced. Her 2 children are [name] who lives in [place] and works as a Development worker, community, and [name] who also lives in [place] and works as a Catering manager. The patient has a grandchild, Arelia Longest, 73 years old as of July 2019, living in [place] with his mother. The patient also has a grandson born in February 2019, who also lives in [place]. The patient has not established herself with a local church yet.    ADVANCED DIRECTIVES: Not in place; at the 06/03/2016 visit the patient was given the appropriate forms to complete and  notarize at her discretion   HEALTH MAINTENANCE: Social History   Tobacco Use   Smoking status: Current Every Day Smoker    Packs/day: 0.25    Years: 43.00    Pack years: 10.75    Types: Cigarettes   Smokeless tobacco: Never Used  Substance Use Topics   Alcohol use: No    Comment: no ETOH since 08/22/12   Drug use: No    Comment: states she's in recovery program for 7 years     Colonoscopy:  PAP:  Bone density:   Allergies  Allergen Reactions   Demerol [Meperidine Hcl] Itching and Nausea And Vomiting   Erythromycin Rash    Current Outpatient Medications on File Prior to Visit  Medication Sig Dispense Refill   anastrozole (ARIMIDEX) 1 MG tablet TAKE 1 TABLET BY MOUTH EVERY DAY (Patient taking differently: Take 1 mg by mouth daily. ) 90 tablet 1   Biotin 5 MG TABS Take 5 mg  by mouth daily.      Cholecalciferol (VITAMIN D3) 50 MCG (2000 UT) capsule Take 2,000 Units by mouth daily.      cyclobenzaprine (FLEXERIL) 10 MG tablet TAKE 1 TABLET BY MOUTH TWICE A DAY (Patient taking differently: Take 10 mg by mouth 2 (two) times daily. ) 60 tablet 1   cycloSPORINE (RESTASIS) 0.05 % ophthalmic emulsion Place 1 drop into both eyes 2 (two) times daily.     docusate sodium (COLACE) 100 MG capsule Take 100 mg by mouth daily.      fluticasone (FLONASE) 50 MCG/ACT nasal spray Place 2 sprays into both nostrils daily. 16 g 4   gabapentin (NEURONTIN) 300 MG capsule Take 1-2 capsules (300-600 mg total) by mouth See admin instructions. Take 300 mg by mouth in the morning and take 600 mg by mouth at bedtime 90 capsule 0   ibuprofen (ADVIL) 800 MG tablet TAKE 1 TABLET BY MOUTH THREE TIMES A DAY (Patient taking differently: Take 800 mg by mouth daily. ) 90 tablet 0   lamoTRIgine (LAMICTAL) 100 MG tablet Take 100 mg by mouth every morning.     loratadine (CLARITIN) 10 MG tablet Take 1 tablet (10 mg total) by mouth daily. 30 tablet 6   Lurasidone HCl (LATUDA) 60 MG TABS Take 60 mg by  mouth daily.     ondansetron (ZOFRAN) 8 MG tablet TAKE 1 TABLET (8 MG TOTAL) BY MOUTH EVERY 8 (EIGHT) HOURS AS NEEDED FOR NAUSEA OR VOMITING. (Patient taking differently: Take 8 mg by mouth 2 (two) times daily. ) 90 tablet 1   pantoprazole (PROTONIX) 40 MG tablet Take 1 tablet (40 mg total) by mouth daily. 90 tablet 1   traZODone (DESYREL) 100 MG tablet Take 1 tablet (100 mg total) by mouth at bedtime. (Patient taking differently: Take 300 mg by mouth at bedtime. ) 30 tablet 0   No current facility-administered medications on file prior to visit.      OBJECTIVE:  Vitals:   07/18/19 0941  BP: 134/81  Pulse: 89  Resp: 18  Temp: 98.1 F (36.7 C)  TempSrc: Oral  SpO2: 99%  Weight: 152 lb (68.9 kg)  Height: 5' 5"  (1.651 m)  Body mass index is 25.29 kg/m.  ECOG FS: 1 - Symptomatic but completely ambulatory  GENERAL: Patient is a well appearing female in no acute distress HEENT:  Sclerae anicteric.  Oropharynx clear and moist. No ulcerations or evidence of oropharyngeal candidiasis. Neck is supple.  NODES:  No cervical, supraclavicular, or axillary lymphadenopathy palpated.  BREAST EXAM:  Deferred. LUNGS:  Clear to auscultation bilaterally.  No wheezes or rhonchi. HEART:  Regular rate and rhythm. No murmur appreciated. ABDOMEN:  Soft, nontender.  Positive, normoactive bowel sounds. No organomegaly palpated. MSK:  No focal spinal tenderness to palpation. Full range of motion bilaterally in the upper extremities. EXTREMITIES:  + swelling in left arm SKIN:  Clear with no obvious rashes or skin changes. No nail dyscrasia. NEURO:  Nonfocal. Well oriented.  Appropriate affect.    LAB RESULTS: No results found for: LABCA2  CBC    Component Value Date/Time   WBC 3.7 (L) 07/18/2019 0924   RBC 4.46 07/18/2019 0924   HGB 13.0 07/18/2019 0924   HGB 13.5 07/05/2019 1129   HGB 12.1 10/26/2017 1038   HCT 39.9 07/18/2019 0924   HCT 35.7 10/26/2017 1038   PLT 54 (L) 07/18/2019 0924    PLT 54 (L) 07/05/2019 1129   PLT 167 10/26/2017 1038   MCV  89.5 07/18/2019 0924   MCV 98.4 10/26/2017 1038   MCH 29.1 07/18/2019 0924   MCHC 32.6 07/18/2019 0924   RDW 14.6 07/18/2019 0924   RDW 13.3 10/26/2017 1038   LYMPHSABS 0.8 07/18/2019 0924   LYMPHSABS 0.5 (L) 10/26/2017 1038   MONOABS 0.3 07/18/2019 0924   MONOABS 0.1 10/26/2017 1038   EOSABS 0.0 07/18/2019 0924   EOSABS 0.0 10/26/2017 1038   BASOSABS 0.0 07/18/2019 0924   BASOSABS 0.0 10/26/2017 1038   CMP Latest Ref Rng & Units 06/20/2019 05/23/2019 05/02/2019  Glucose 70 - 99 mg/dL 99 98 94  BUN 6 - 20 mg/dL 9 8 11   Creatinine 0.44 - 1.00 mg/dL 0.99 1.06(H) 1.08(H)  Sodium 135 - 145 mmol/L 140 139 140  Potassium 3.5 - 5.1 mmol/L 3.7 3.6 4.0  Chloride 98 - 111 mmol/L 103 104 102  CO2 22 - 32 mmol/L 26 27 27   Calcium 8.9 - 10.3 mg/dL 9.2 9.1 9.8  Total Protein 6.5 - 8.1 g/dL 6.8 6.9 7.3  Total Bilirubin 0.3 - 1.2 mg/dL 0.3 0.3 0.3  Alkaline Phos 38 - 126 U/L 80 73 74  AST 15 - 41 U/L 39 47(H) 38  ALT 0 - 44 U/L 32 45(H) 36     STUDIES: Mr Jeri Cos MA Contrast  Result Date: 07/06/2019 CLINICAL DATA:  Metastatic breast cancer. History of left cerebellar metastasis status post resection and postoperative whole-brain radiation. EXAM: MRI HEAD WITHOUT AND WITH CONTRAST TECHNIQUE: Multiplanar, multiecho pulse sequences of the brain and surrounding structures were obtained without and with intravenous contrast. CONTRAST:  7 mL Gadavist COMPARISON:  01/24/2019 FINDINGS: Brain: There is no evidence of acute infarct, mass, midline shift, or extra-axial fluid collection. A chronic microhemorrhage in the right parietal lobe is likely unchanged though more conspicuous on today's study due to imaging at 3 T. Sequelae of left suboccipital craniectomy are again identified with unchanged small region of encephalomalacia in the left cerebellum. Confluent T2 hyperintensity throughout the cerebral white matter bilaterally is unchanged and  compatible with post radiation sequela. No abnormal brain parenchymal or meningeal enhancement suggestive of recurrent metastatic disease is evident. There is mild-to-moderate cerebral atrophy. Vascular: Major intracranial vascular flow voids are preserved. Skull and upper cervical spine: Left suboccipital craniectomy. No suspicious marrow lesion. Sinuses/Orbits: Unremarkable orbits. Trace bilateral mastoid fluid. Clear paranasal sinuses. Other: None. IMPRESSION: Stable post treatment changes without evidence of recurrent metastatic disease. Electronically Signed   By: Logan Bores M.D.   On: 07/06/2019 15:54    ELIGIBLE FOR AVAILABLE RESEARCH PROTOCOL: no  ASSESSMENT: 58 y.o. 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  (2) status post left mastectomy and axillary lymph node dissection  with immediate expander placement 07/18/2015 for an mpT4 pN2,stage IIIB invasive ductal carcinoma, grade 3, with negative margins.  (a) definitive implant exchange to be scheduled in December   METASTATIC DISEASE: October 2016  (3) CT scan of the chest abdomen and pelvis  09/16/2015 shows metastatic lesions in the right scapula, left iliac crest, L4, and T spine. There were questionable liver cysts, with repeat CT scan 03/02/2016 showing possible right upper lobe lung lesions and possibly increased liver lesions  (a) CT scan of the chest 06/17/2016 shows no active disease in the lungs or liver  (b) Bone scan July 2017 showed no evidence of bony  metastatic disease   (c) head CT 07/08/2016 showed a cerebellar lesion, confirmed by MRI 07/23/2016, status post craniotomy 08/14/2016, confirming a metastatic deposit which was estrogen and progesterone receptor negative, HER-2 amplified with a signals ratio of 7.16, number per cell  13.25  (d) CA 27-29 is not informative  (4) received docetaxel every 3 weeks 6 together with trastuzumab and pertuzumab, last docetaxel dose 02/11/2016  (5) adjuvant radiation7/03/2016 to 06/26/2016 at [facility]: 1. The Left chest wall was treated to 23.4 Gy in 13 fractions at 1.8 Gy per fraction. 2. The Left chest wall was boosted to 10 Gy in 5 fractions at 2 Gy per fraction. 3. The Left Sclav/PAB was treated to 23.4 Gy in 13 fractions at 1.8 Gy per fraction.  [Note: Including the patient's treatment in [facility] (received 15 fractions per Dr. Maryan Rued near [place], [place]), the patient received 50.4 Gy to the left chest wall and supraclavicular region. ]  (6) started trastuzumab and pertuzumab October 2016, continuing every 3 weeks,  (a) echocardiogram 02/26/2016 showed a well preserved ejection fraction  (b) echocardiogram 07/01/2016 shows an ejection fraction in the 60-65%   (c) pertuzumab discontinued 10/2016 with uncontrolled diarrhea  (d) echocardiogram 11/11/2016 showed an ejection fraction in the 60-65%  (e) echocardiogram 03/03/2017 shows an ejection fraction of 60-65%  (f) echocardiogram on 05/19/2017 shows an ejection fraction of 55-60%  (g) echocardiogram 09/24/2017 shows the ejection fraction in the 60-65%  (h) echocardiogram 02/14/2018 shows an ejection fraction in the 60-65%  (I) echocardiogram  06/30/2018 shows an ejection fraction in the 55-60%  (m) echocardiogram on 12/08/2018 shows an ejection fraction in the 60-65% range      (7) started denosumab/Xgeva October 2017 given every 4 weeks, transitioned to every 8 weeks beginning 10/11/18 (every 6 weeks while giving TDM1 every 3 weeks)  (8) started anastrozole October 2017   (a) bone scan 11/10/2016 shows no active disease  (b) chest CT scan 11/10/2016 stable, with no evidence of active disease  (c) chest CT and bone scan 07/02/2017 show no evidence of active disease  (d) CT scan of the chest with contrast 11/10/2017  shows some left axillary edema, but no evidence of thrombosis or adenopathy in that area, 0.9 cm precarinal lymph and 0.7 cm right upper lobe nodule node; bone lesions stable  (e) CT of the chest 05/04/2018 shows a 1.4 cm right lower paratracheal node which is slightly increased and a new right prevascular mediastinal node measuring 0.7 cm.  Bone lesions are stable.  (f) chest CT on 07/01/2018 shows no definite findings of metastatic disease in the thorax. Previously noted borderline enlarged low right paratracheal lymph node is stable to slightly decreased in size   (e) chest CT on 12/30/2018 notes mild increase in right paratracheal adneopathy, recommended PET scan.  Pet scan on 01/19/2019 showed a hypermetabolic and enlarged right paratracheal lymph node consistent with breast cancer recurrence.  (f)Trastuzumab discontinued due to February 2020 scans   (g) TDM-1 started on 01/31/2019 given every 21 days.  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  (a) brain irradiation10/19/17-11/08/17:  Whole brain/ 35 Gy in 14 fractions   (b) repeat brain MRI obtained 12/10/2016 shows no active disease in the brain  (c) repeat brain MRI 03/02/2017 shows no evidence of residual or recurrent disease  (d) repeat brain MRI 07/29/2017 shows no evidence of residual or recurrent disease  (e) repeat brain MRI 12/07/2017 shows no evidence of disease recurrence.  There is progressive white matter change secondary to prior treatment.  (f) repeat brain MRI 04/07/2018 showed no evidence of disease\  (g)  repeat brain MRI on 08/23/2018 shows no evidence of disease  (h) repeat brain MRI on 01/25/2019 shows no evidence of disease   PLAN: Karra continues to do well.  She is clinically without any signs of disease progression of her metastatic breast cancer.  She will continue on TDM1 every 3 weeks.  She has thrombocytopenia and I reviewed her decreased platelet count of 54 with Dr. [name].  She is ok to treat with this platelet count.  Holli needs scans.  We could not get PET scan approved by insurance.  We are awaiting on bone scan and CT chest to be scheduled.  I have sent Nilda Calamity a staff message to see where we are in insurance authorization of this.  Treana will continue on Anastrozole daily and will continue to receive the Xgeva every 6 weeks.  She is tolerating these well.  We will see Hanifah back every 3 weeks for TDM1.  She will undergo restaging scans and see Dr. [name] back in 6 weeks.  She was recommended to continue with the appropriate pandemic precautions. She knows to call for any questions that may arise between now and her next appointment.  We are happy to see her sooner if needed.   A total of (20) minutes of face-to-face time was spent with this patient with greater than 50% of that time in counseling and care-coordination.    Wilber Bihari, NP  07/18/19 10:02 AM Medical Oncology and Hematology [facility] [address] [place], [place] [address] Tel. [phone]    Fax. [phone]

## 2019-07-18 NOTE — Patient Instructions (Signed)
[facility] Discharge Instructions for Patients Receiving Chemotherapy  Today you received the following chemotherapy agents Kadcyla  To help prevent nausea and vomiting after your treatment, we encourage you to take your nausea medication as directed   If you develop nausea and vomiting that is not controlled by your nausea medication, call the clinic.   BELOW ARE SYMPTOMS THAT SHOULD BE REPORTED IMMEDIATELY:  *FEVER GREATER THAN 100.5 F  *CHILLS WITH OR WITHOUT FEVER  NAUSEA AND VOMITING THAT IS NOT CONTROLLED WITH YOUR NAUSEA MEDICATION  *UNUSUAL SHORTNESS OF BREATH  *UNUSUAL BRUISING OR BLEEDING  TENDERNESS IN MOUTH AND THROAT WITH OR WITHOUT PRESENCE OF ULCERS  *URINARY PROBLEMS  *BOWEL PROBLEMS  UNUSUAL RASH Items with * indicate a potential emergency and should be followed up as soon as possible.  Feel free to call the clinic should you have any questions or concerns. The clinic phone number is [phone].  Please show the CHEMO ALERT CARD at check-in to the Emergency Department and triage nurse.   

## 2019-07-19 ENCOUNTER — Telehealth: Payer: Self-pay | Admitting: Oncology

## 2019-07-19 LAB — CANCER ANTIGEN 27.29: CA 27.29: 7.7 U/mL (ref 0.0–38.6)

## 2019-07-19 NOTE — Telephone Encounter (Signed)
I left a message regarding schedule  

## 2019-07-20 ENCOUNTER — Telehealth: Payer: Self-pay | Admitting: *Deleted

## 2019-07-20 ENCOUNTER — Ambulatory Visit: Payer: Medicaid Other

## 2019-07-20 NOTE — Telephone Encounter (Signed)
Per Suzan Garibaldi NP, bone scan and Chest CT appt scheduled for 8/27 starting @ 11am. Pt called and made aware of schedule day and time. Advised ONLY liquids 4hr prior to appt. Pt verbalized understanding. Vmail was also left on pt phone per pt request.

## 2019-07-25 ENCOUNTER — Other Ambulatory Visit: Payer: Self-pay

## 2019-07-25 ENCOUNTER — Ambulatory Visit: Payer: Medicaid Other

## 2019-07-25 ENCOUNTER — Other Ambulatory Visit: Payer: Self-pay | Admitting: Medical

## 2019-07-25 DIAGNOSIS — I972 Postmastectomy lymphedema syndrome: Secondary | ICD-10-CM | POA: Diagnosis not present

## 2019-07-25 DIAGNOSIS — M79602 Pain in left arm: Secondary | ICD-10-CM

## 2019-07-25 DIAGNOSIS — N3001 Acute cystitis with hematuria: Secondary | ICD-10-CM

## 2019-07-25 DIAGNOSIS — R293 Abnormal posture: Secondary | ICD-10-CM

## 2019-07-25 MED ORDER — CIPROFLOXACIN HCL 500 MG PO TABS: 500.0000 mg | ORAL_TABLET | Freq: Two times a day (BID) | ORAL | 0 refills | Status: DC

## 2019-07-25 MED ORDER — PHENAZOPYRIDINE HCL 100 MG PO TABS: 100.0000 mg | ORAL_TABLET | Freq: Three times a day (TID) | ORAL | 0 refills | Status: DC | PRN

## 2019-07-25 NOTE — Therapy (Signed)
[place], [place], [address] Phone: [phone]   Fax:  [phone]  Physical Therapy Treatment  Patient Details  Name: [name] MRN: [id number] Date of Birth: 10-27-1961 Referring Provider (PT): Wilber Bihari, NP   Encounter Date: 07/25/2019  PT End of Session - 07/25/19 1155    Visit Number  2    Number of Visits  9    Date for PT Re-Evaluation  09/08/19    Authorization Type  medicaid    Authorization Time Period  to 08/09/19 for 4 visits    Authorization - Visit Number  2    Authorization - Number of Visits  4    PT Start Time  1100    PT Stop Time  1148    PT Time Calculation (min)  48 min    Activity Tolerance  Patient tolerated treatment well    Behavior During Therapy  [facility] for tasks assessed/performed       Past Medical History:  Diagnosis Date  . Alcohol abuse   . Anemia    during chemo  . Anxiety    At age 58  . Arthritis Dx 2010  . Bipolar disorder ([place])   . Bronchitis   . Cancer ([place])    breast mets to brain  . Chronic pain   . Complication of anesthesia   . Depression   . Family history of adverse reaction to anesthesia    MOther had PONV  . Fibromyalgia Dx 2005  . GERD (gastroesophageal reflux disease)   . Headache    hx  migraines  . Lymphedema of left arm   . Opiate dependence ([place])   . PONV (postoperative nausea and vomiting)   . Port-A-Cath in place   . PTSD (post-traumatic stress disorder)     Past Surgical History:  Procedure Laterality Date  . APPLICATION OF CRANIAL NAVIGATION N/A 08/14/2016   Procedure: APPLICATION OF CRANIAL NAVIGATION;  Surgeon: Erline Levine, MD;  Location: [facility];  Service: Neurosurgery;  Laterality: N/A;  . BREAST RECONSTRUCTION Left    with silicone implant  . COLONOSCOPY W/ POLYPECTOMY    . CRANIOTOMY N/A 08/14/2016   Procedure: CRANIOTOMY TUMOR EXCISION WITH Lucky Rathke;  Surgeon: Erline Levine, MD;  Location: [facility];   Service: Neurosurgery;  Laterality: N/A;  . FIBULA FRACTURE SURGERY Left   . MASTECTOMY Left   . RADIOLOGY WITH ANESTHESIA N/A 07/23/2016   Procedure: MRI OF BRAIN WITH AND WITHOUT;  Surgeon: Medication Radiologist, MD;  Location: [facility];  Service: Radiology;  Laterality: N/A;  . RADIOLOGY WITH ANESTHESIA N/A 09/08/2016   Procedure: MRI OF BRAIN WITH AND WITHOUT CONTRAST;  Surgeon: Medication Radiologist, MD;  Location: [facility];  Service: Radiology;  Laterality: N/A;  . RADIOLOGY WITH ANESTHESIA N/A 12/10/2016   Procedure: MRI OF BRAIN WITH AND WITHOUT;  Surgeon: Medication Radiologist, MD;  Location: [facility];  Service: Radiology;  Laterality: N/A;  . RADIOLOGY WITH ANESTHESIA N/A 03/02/2017   Procedure: MRI of BRAIN W and W/OUT CONTRAST;  Surgeon: Medication Radiologist, MD;  Location: [facility];  Service: Radiology;  Laterality: N/A;  . RADIOLOGY WITH ANESTHESIA N/A 07/29/2017   Procedure: RADIOLOGY WITH ANESTHESIA MRI OF BRAIN WITH AND WITHOUT CONTRAST;  Surgeon: Radiologist, Medication, MD;  Location: [facility];  Service: Radiology;  Laterality: N/A;  . RADIOLOGY WITH ANESTHESIA N/A 12/07/2017   Procedure: MRI WITH ANESTHESIA OF BRAIN WITH AND WITHOUT CONTRAST;  Surgeon: Radiologist, Medication, MD;  Location: [facility];  Service: Radiology;  Laterality: N/A;  . RADIOLOGY WITH ANESTHESIA N/A 04/07/2018   Procedure: MRI OF BRAIN WITH AND WITHOUT CONTRAST;  Surgeon: Radiologist, Medication, MD;  Location: [facility];  Service: Radiology;  Laterality: N/A;  . RADIOLOGY WITH ANESTHESIA N/A 08/23/2018   Procedure: MRI WITH ANESTHESIA OF THE BRAIN WITH AND WITHOUT;  Surgeon: Radiologist, Medication, MD;  Location: [facility];  Service: Radiology;  Laterality: N/A;  . RADIOLOGY WITH ANESTHESIA N/A 01/24/2019   Procedure: MRI OF BRAIN WITH AND WITHOUT CONTRAST;  Surgeon: Radiologist, Medication, MD;  Location: [facility];  Service: Radiology;  Laterality: N/A;  . RADIOLOGY WITH ANESTHESIA N/A 07/06/2019   Procedure: MRI WITH ANESTHESIA OF  BRAIN WITH AND WITHOUT CONTRAST;  Surgeon: Radiologist, Medication, MD;  Location: [facility];  Service: Radiology;  Laterality: N/A;  . right power port placement Right     There were no vitals filed for this visit.  Subjective Assessment - 07/25/19 1105    Subjective  Pt reports having a UTI or bladder infection and her doctor prescribed her Pyridium and Keflex which she just picked up before coming her today. She has been using nighttime garment every night since evaluation and has noticed great improvements since then. Only tried pump once but reports can't sit still for an hour.    Pertinent History  Treated here in March of last year currently with a reid sleeve, day sleeve and compression pump. Lt mastectomy and ALND with reconstruction 07/08/15 for T4N2 stage IIIB IDC. Metastatic disease in the brain s/p craniotomy 08/14/16. Chemo completed in 2017 Trastuzumab and Pertuzumab, docetaxel. Radiation completed 2017. Stable bone lesions to the Rt scapula, Lt iliac crest, L4, Tspine. Also probable in the liver and lungs. Pt is also bipolar and a recovered drug and alchohol user.  Current treatment TDM-1, denosumab and anastrozole.    Limitations  Other (comment)   reports none   Patient Stated Goals  get the swelling down    Currently in Pain?  No/denies            LYMPHEDEMA/ONCOLOGY QUESTIONNAIRE - 07/25/19 1142      Left Upper Extremity Lymphedema   15 cm Proximal to Olecranon Process  33.4 cm    10 cm Proximal to Olecranon Process  33.5 cm    Olecranon Process  27.2 cm    15 cm Proximal to Ulnar Styloid Process  26.6 cm    10 cm Proximal to Ulnar Styloid Process  22.7 cm    Just Proximal to Ulnar Styloid Process  17.2 cm    Across Hand at [employer]  17.3 cm    At [place] of 2nd Digit  5.6 cm                [facility] Adult PT Treatment/Exercise - 07/25/19 0001      Manual Therapy   Manual Therapy  Manual Lymphatic Drainage (MLD)    Manual Lymphatic Drainage (MLD)  In  supine: Short neck, 5 diaphragmatic breaths, Lt inguinal and Rt axillary nodes, Lt axillo-inguinal anterior inter-axillary anastomosis, then Lt UE from lateral shoulder to dorsal hand workin gfrom proximal to distal then retracing all steps beginning to instruct pt in this.              PT Education - 07/25/19 1155    Education Details  Self manual lymph drainage    Person(s) Educated  Patient    Methods  Explanation;Demonstration;Tactile cues;Verbal cues;Handout    Comprehension  Verbalized understanding;Returned demonstration;Tactile cues  required;Need further instruction          PT Long Term Goals - 07/07/19 0944      PT LONG TERM GOAL #1   Title  Reduce left arm circumference at 10 cm proximal to her olecranon to </= 30 cm.    Baseline  baseline 34.5cm    Time  8    Period  Weeks    Status  New      PT LONG TERM GOAL #2   Title  Reduce left arm circumference at 10 cm proximal to her ulnar styloid process to </= 20 cm.    Baseline  22.8    Time  8    Period  Weeks    Status  New      PT LONG TERM GOAL #3   Title  Pt will be ind with lymphedema self care to include pump use or MLD and appropriate garments    Time  8    Period  Weeks    Status  New            Plan - 07/25/19 1157    Clinical Impression Statement  Pts first visit back since evaluation. Her compliance is improving as she has worn her nighttime garment every night since eval and her circumference measurements show improvement due to this. She only did pump once reporting sitting for an hour was hard for her so instructed her to call pump company and see if they can teach her to add 30 min sequence? Has not worn old daytime sleeve as she reports it's loose and ill fitting. She was educated in self manual lymph drainage today while therapist performing and having pt return demo with VCs and hand over hand instruction for correct technique and pressure. Pt will need further review in this to assess  technique. She will benefit from and would like to get another day time sleeve as her old one does not fit right. Pt is agreeable to PTA sending demographics to [facility] to be fitted for new compression garments with them. Also sent script to doctor for same.    Personal Factors and Comorbidities  Behavior Pattern;Past/Current Experience;Time since onset of injury/illness/exacerbation;Comorbidity 2    Comorbidities  radiation and all lymph nodes removed    Stability/Clinical Decision Making  Evolving/Moderate complexity    Rehab Potential  Fair   declines bandaging and poor compliance   PT Frequency  1x / week    PT Duration  8 weeks    PT Treatment/Interventions  ADLs/Self Care Home Management;Manual techniques;Manual lymph drainage;Compression bandaging;Passive range of motion;Therapeutic exercise;Taping    PT Next Visit Plan  Continue Lt UE MLD and review this with pt assessing her technique. Pt plans to be measured after next visit here at our clinic at 1000. Did she call pump company about shortened sequence?    PT Home Exercise Plan  Begin trying self MLD    Consulted and Agree with Plan of Care  Patient       Patient will benefit from skilled therapeutic intervention in order to improve the following deficits and impairments:  Decreased knowledge of use of DME, Decreased skin integrity, Increased edema  Visit Diagnosis: Postmastectomy lymphedema  Abnormal posture  Pain in left arm     Problem List Patient Active Problem List   Diagnosis Date Noted  . Port-A-Cath in place 06/20/2019  . 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 S/P mastectomy, left 12/09/2018  .  S/P breast reconstruction, left 12/09/2018  . S/P radiation therapy 12/09/2018  . GERD (gastroesophageal reflux disease) 12/10/2017  . Edema 11/09/2017  . Sensorineural hearing loss (SNHL) of both ears 11/05/2017  . Vitamin D deficiency 01/18/2017  . Bipolar I disorder, most  recent episode depressed ([place]) 12/08/2016  . Adjustment disorder with anxiety 12/06/2016  . Metastatic breast cancer ([place])   . History of cancer metastatic to brain 07/27/2016  . Iron deficiency anemia 06/26/2016  . Bone metastases ([place]) 06/03/2016  . Primary cancer of lower-inner quadrant of left female breast ([place]) 06/01/2016  . Current smoker 03/28/2015  . Seasonal allergies 03/28/2015  . Anxiety state 02/28/2015  . Fibromyalgia 02/28/2015  . Family history of diabetes mellitus 02/28/2015  . H/O alcohol abuse     Otelia Limes, [place] 07/25/2019, 12:04 PM  [place], [place], [address] Phone: [phone]   Fax:  [phone]  Name: [name] MRN: [id number] Date of Birth: 1961/04/18

## 2019-07-25 NOTE — Patient Instructions (Signed)
Deep Effective Breath     Cancer Rehab [phone]   Standing, sitting, or laying down, place both hands on the belly. Take a deep breath IN, expanding the belly; then breath OUT, contracting the belly. Repeat __5__ times. Do __2-3__ sessions per day and before your self massage.  Axilla to Axilla - Sweep   On uninvolved side make 5 circles in the armpit, then pump _5__ times from involved armpit across chest to uninvolved armpit, making a pathway. Do _1__ time per day.  Copyright  VHI. All rights reserved.  Axilla to Inguinal Nodes - Sweep   On involved side, make 5 circles at groin at panty line, then pump _5__ times from armpit along side of trunk to outer hip, making your other pathway. Do __1_ time per day.  Copyright  VHI. All rights reserved.  Arm Posterior: Elbow to Shoulder - Sweep   Pump _5__ times from back of elbow to top of shoulder. Then inner to outer upper arm _5_ times, then outer arm again _5_ times. Then back to the pathways _2-3_ times. Do _1__ time per day.  Copyright  VHI. All rights reserved.  ARM: [place] Wrist to Elbow - Sweep   Pump or stationary circles _5__ times from wrist to elbow making sure to do both sides of the forearm. Then retrace your steps to the outer arm, and the pathways _2-3_ times each. Do _1__ time per day.  Copyright  VHI. All rights reserved.  ARM: Dorsum of Hand to Shoulder - Sweep   Pump or stationary circles _5__ times on back of hand including knuckle spaces and individual fingers if needed working up towards the wrist, then retrace all your steps working back up the forearm, doing both sides; upper outer arm and back to your pathways _2-3_ times each. Then do 5 circles again at uninvolved armpit and involved groin where you started! Good job!! Do __1_ time per day.

## 2019-07-27 ENCOUNTER — Ambulatory Visit (HOSPITAL_COMMUNITY): Payer: Medicaid Other

## 2019-07-27 ENCOUNTER — Encounter (HOSPITAL_COMMUNITY): Payer: Medicaid Other

## 2019-07-27 ENCOUNTER — Telehealth: Payer: Self-pay | Admitting: Oncology

## 2019-07-27 ENCOUNTER — Ambulatory Visit (HOSPITAL_COMMUNITY): Admission: RE | Admit: 2019-07-27 | Payer: Medicaid Other | Source: Ambulatory Visit

## 2019-07-27 NOTE — Telephone Encounter (Signed)
Called patient regarding infusion log, informed patient 09/09 appointment time has been changed due to holiday.

## 2019-07-28 ENCOUNTER — Other Ambulatory Visit: Payer: Self-pay | Admitting: Oncology

## 2019-07-31 ENCOUNTER — Ambulatory Visit: Payer: Medicaid Other | Admitting: Rehabilitation

## 2019-07-31 ENCOUNTER — Encounter: Payer: Self-pay | Admitting: Rehabilitation

## 2019-07-31 ENCOUNTER — Other Ambulatory Visit: Payer: Self-pay

## 2019-07-31 DIAGNOSIS — M79602 Pain in left arm: Secondary | ICD-10-CM

## 2019-07-31 DIAGNOSIS — I972 Postmastectomy lymphedema syndrome: Secondary | ICD-10-CM

## 2019-07-31 DIAGNOSIS — M25612 Stiffness of left shoulder, not elsewhere classified: Secondary | ICD-10-CM

## 2019-07-31 DIAGNOSIS — R293 Abnormal posture: Secondary | ICD-10-CM

## 2019-07-31 NOTE — Therapy (Signed)
[place], [place], [address] Phone: [phone]   Fax:  [phone]  Physical Therapy Treatment  Patient Details  Name: [name] MRN: [id number] Date of Birth: 15-Nov-1961 Referring Provider (PT): Wilber Bihari, NP   Encounter Date: 07/31/2019  PT End of Session - 07/31/19 1350    Visit Number  3    Number of Visits  9    Date for PT Re-Evaluation  09/08/19    Authorization Type  medicaid    Authorization - Visit Number  3    Authorization - Number of Visits  4    PT Start Time  1300    PT Stop Time  1346    PT Time Calculation (min)  46 min    Activity Tolerance  Patient tolerated treatment well    Behavior During Therapy  [facility] for tasks assessed/performed       Past Medical History:  Diagnosis Date  . Alcohol abuse   . Anemia    during chemo  . Anxiety    At age 38  . Arthritis Dx 2010  . Bipolar disorder ([place])   . Bronchitis   . Cancer ([place])    breast mets to brain  . Chronic pain   . Complication of anesthesia   . Depression   . Family history of adverse reaction to anesthesia    MOther had PONV  . Fibromyalgia Dx 2005  . GERD (gastroesophageal reflux disease)   . Headache    hx  migraines  . Lymphedema of left arm   . Opiate dependence ([place])   . PONV (postoperative nausea and vomiting)   . Port-A-Cath in place   . PTSD (post-traumatic stress disorder)     Past Surgical History:  Procedure Laterality Date  . APPLICATION OF CRANIAL NAVIGATION N/A 08/14/2016   Procedure: APPLICATION OF CRANIAL NAVIGATION;  Surgeon: Erline Levine, MD;  Location: [facility];  Service: Neurosurgery;  Laterality: N/A;  . BREAST RECONSTRUCTION Left    with silicone implant  . COLONOSCOPY W/ POLYPECTOMY    . CRANIOTOMY N/A 08/14/2016   Procedure: CRANIOTOMY TUMOR EXCISION WITH Lucky Rathke;  Surgeon: Erline Levine, MD;  Location: [facility];  Service: Neurosurgery;  Laterality: N/A;  . FIBULA  FRACTURE SURGERY Left   . MASTECTOMY Left   . RADIOLOGY WITH ANESTHESIA N/A 07/23/2016   Procedure: MRI OF BRAIN WITH AND WITHOUT;  Surgeon: Medication Radiologist, MD;  Location: [facility];  Service: Radiology;  Laterality: N/A;  . RADIOLOGY WITH ANESTHESIA N/A 09/08/2016   Procedure: MRI OF BRAIN WITH AND WITHOUT CONTRAST;  Surgeon: Medication Radiologist, MD;  Location: [facility];  Service: Radiology;  Laterality: N/A;  . RADIOLOGY WITH ANESTHESIA N/A 12/10/2016   Procedure: MRI OF BRAIN WITH AND WITHOUT;  Surgeon: Medication Radiologist, MD;  Location: [facility];  Service: Radiology;  Laterality: N/A;  . RADIOLOGY WITH ANESTHESIA N/A 03/02/2017   Procedure: MRI of BRAIN W and W/OUT CONTRAST;  Surgeon: Medication Radiologist, MD;  Location: [facility];  Service: Radiology;  Laterality: N/A;  . RADIOLOGY WITH ANESTHESIA N/A 07/29/2017   Procedure: RADIOLOGY WITH ANESTHESIA MRI OF BRAIN WITH AND WITHOUT CONTRAST;  Surgeon: Radiologist, Medication, MD;  Location: [facility];  Service: Radiology;  Laterality: N/A;  . RADIOLOGY WITH ANESTHESIA N/A 12/07/2017   Procedure: MRI WITH ANESTHESIA OF BRAIN WITH AND WITHOUT CONTRAST;  Surgeon: Radiologist, Medication, MD;  Location: [facility];  Service: Radiology;  Laterality: N/A;  . RADIOLOGY WITH ANESTHESIA N/A  04/07/2018   Procedure: MRI OF BRAIN WITH AND WITHOUT CONTRAST;  Surgeon: Radiologist, Medication, MD;  Location: [facility];  Service: Radiology;  Laterality: N/A;  . RADIOLOGY WITH ANESTHESIA N/A 08/23/2018   Procedure: MRI WITH ANESTHESIA OF THE BRAIN WITH AND WITHOUT;  Surgeon: Radiologist, Medication, MD;  Location: [facility];  Service: Radiology;  Laterality: N/A;  . RADIOLOGY WITH ANESTHESIA N/A 01/24/2019   Procedure: MRI OF BRAIN WITH AND WITHOUT CONTRAST;  Surgeon: Radiologist, Medication, MD;  Location: [facility];  Service: Radiology;  Laterality: N/A;  . RADIOLOGY WITH ANESTHESIA N/A 07/06/2019   Procedure: MRI WITH ANESTHESIA OF BRAIN WITH AND WITHOUT CONTRAST;  Surgeon: Radiologist,  Medication, MD;  Location: [facility];  Service: Radiology;  Laterality: N/A;  . right power port placement Right     There were no vitals filed for this visit.  Subjective Assessment - 07/31/19 1303    Subjective  I have been sleeping every night in the reid sleeve, sometimes my hand gets swollen but not the arm. I still can't sit with the pump for 1 hour its too boring.  I didn't call the pump guy.    Pertinent History  Treated here in March of last year currently with a reid sleeve, day sleeve and compression pump. Lt mastectomy and ALND with reconstruction 07/08/15 for T4N2 stage IIIB IDC. Metastatic disease in the brain s/p craniotomy 08/14/16. Chemo completed in 2017 Trastuzumab and Pertuzumab, docetaxel. Radiation completed 2017. Stable bone lesions to the Rt scapula, Lt iliac crest, L4, Tspine. Also probable in the liver and lungs. Pt is also bipolar and a recovered drug and alchohol user.  Current treatment TDM-1, denosumab and anastrozole.    Currently in Pain?  No/denies            LYMPHEDEMA/ONCOLOGY QUESTIONNAIRE - 07/31/19 1314      Left Upper Extremity Lymphedema   15 cm Proximal to Olecranon Process  33.5 cm    10 cm Proximal to Olecranon Process  33.8 cm    Olecranon Process  29.2 cm    15 cm Proximal to Ulnar Styloid Process  26.6 cm    10 cm Proximal to Ulnar Styloid Process  22.6 cm    Just Proximal to Ulnar Styloid Process  17.5 cm    Across Hand at [employer]  19.6 cm    At [place] of 2nd Digit  6.2 cm                [facility] Adult PT Treatment/Exercise - 07/31/19 0001      Manual Therapy   Manual Therapy  Edema management    Edema Management  checked fit on the reid sleeve checked for fit  Seems to fit well.  Talked about not pulling it too tight at the wrist to keep the hand from swelling.     Manual Lymphatic Drainage (MLD)  In supine: Short neck, no abdominals due to tenderness/UTI, 5 diaphragmatic breaths, Lt inguinal and Rt axillary nodes, Lt  axillo-inguinal anterior inter-axillary anastomosis, then Lt UE from lateral shoulder to dorsal hand workin gfrom proximal to distal then retracing all steps beginning to instruct pt in this.  Then in Rt sidelying posterior interaxillary and axillo inguinal.                   PT Long Term Goals - 07/31/19 1352      PT LONG TERM GOAL #1   Title  Reduce left arm circumference at 10 cm proximal to  her olecranon to </= 30 cm.    Status  Partially Met      PT LONG TERM GOAL #2   Title  Reduce left arm circumference at 10 cm proximal to her ulnar styloid process to </= 20 cm.    Status  On-going      PT LONG TERM GOAL #3   Title  Pt will be ind with lymphedema self care to include pump use or MLD and appropriate garments    Status  Partially Met            Plan - 07/31/19 1350    Clinical Impression Statement  pt missed appt today for compression fitting so is scheduled again for 08/14/19.  Discussed finding things to do like read or watch tv during pump time in order to prioritize this before garment fitting.  No change in measurements with pt wearing reid sleeve x 1 week every night.  continued with MLD of the Lt UE.    PT Frequency  1x / week    PT Duration  8 weeks    PT Treatment/Interventions  ADLs/Self Care Home Management;Manual techniques;Manual lymph drainage;Compression bandaging;Passive range of motion;Therapeutic exercise;Taping    PT Next Visit Plan  Continue Lt UE MLD and review this with pt assessing her technique. getting garment measured 9.14    Consulted and Agree with Plan of Care  Patient       Patient will benefit from skilled therapeutic intervention in order to improve the following deficits and impairments:     Visit Diagnosis: Postmastectomy lymphedema  Abnormal posture  Pain in left arm  Stiffness of left shoulder, not elsewhere classified     Problem List Patient Active Problem List   Diagnosis Date Noted  . Port-A-Cath in place  06/20/2019  . 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[place]) 03/30/2019  . S/P mastectomy, left 12/09/2018  . S/P breast reconstruction, left 12/09/2018  . S/P radiation therapy 12/09/2018  . GERD (gastroesophageal reflux disease) 12/10/2017  . Edema 11/09/2017  . Sensorineural hearing loss (SNHL) of both ears 11/05/2017  . Vitamin D deficiency 01/18/2017  . Bipolar I disorder, most recent episode depressed ([place]) 12/08/2016  . Adjustment disorder with anxiety 12/06/2016  . Metastatic breast cancer ([place])   . History of cancer metastatic to brain 07/27/2016  . Iron deficiency anemia 06/26/2016  . Bone metastases ([place]) 06/03/2016  . Primary cancer of lower-inner quadrant of left female breast ([place]) 06/01/2016  . Current smoker 03/28/2015  . Seasonal allergies 03/28/2015  . Anxiety state 02/28/2015  . Fibromyalgia 02/28/2015  . Family history of diabetes mellitus 02/28/2015  . H/O alcohol abuse     Stark Bray 07/31/2019, 1:54 PM  [place], [place], [address] Phone: [phone]   Fax:  [phone]  Name: [name] MRN: [id number] Date of Birth: Sep 06, 1961

## 2019-08-03 ENCOUNTER — Ambulatory Visit: Payer: Medicaid Other | Attending: Adult Health | Admitting: Rehabilitation

## 2019-08-03 ENCOUNTER — Ambulatory Visit: Payer: Medicaid Other

## 2019-08-03 DIAGNOSIS — R2681 Unsteadiness on feet: Secondary | ICD-10-CM | POA: Insufficient documentation

## 2019-08-03 DIAGNOSIS — R2689 Other abnormalities of gait and mobility: Secondary | ICD-10-CM | POA: Insufficient documentation

## 2019-08-03 DIAGNOSIS — M6281 Muscle weakness (generalized): Secondary | ICD-10-CM | POA: Insufficient documentation

## 2019-08-09 ENCOUNTER — Inpatient Hospital Stay: Payer: Medicaid Other | Attending: Medical

## 2019-08-09 ENCOUNTER — Other Ambulatory Visit: Payer: Self-pay

## 2019-08-09 ENCOUNTER — Other Ambulatory Visit: Payer: Medicaid Other

## 2019-08-09 ENCOUNTER — Ambulatory Visit (HOSPITAL_BASED_OUTPATIENT_CLINIC_OR_DEPARTMENT_OTHER): Payer: Medicaid Other | Admitting: Oncology

## 2019-08-09 ENCOUNTER — Telehealth: Payer: Self-pay | Admitting: *Deleted

## 2019-08-09 ENCOUNTER — Inpatient Hospital Stay: Payer: Medicaid Other

## 2019-08-09 ENCOUNTER — Ambulatory Visit: Payer: Medicaid Other

## 2019-08-09 ENCOUNTER — Other Ambulatory Visit: Payer: Self-pay | Admitting: Oncology

## 2019-08-09 ENCOUNTER — Other Ambulatory Visit: Payer: Self-pay | Admitting: Adult Health

## 2019-08-09 VITALS — BP 120/77 | HR 85 | Temp 97.8°F | Resp 16

## 2019-08-09 DIAGNOSIS — D696 Thrombocytopenia, unspecified: Secondary | ICD-10-CM | POA: Insufficient documentation

## 2019-08-09 DIAGNOSIS — C50312 Malignant neoplasm of lower-inner quadrant of left female breast: Secondary | ICD-10-CM

## 2019-08-09 DIAGNOSIS — Z5112 Encounter for antineoplastic immunotherapy: Secondary | ICD-10-CM | POA: Diagnosis not present

## 2019-08-09 DIAGNOSIS — C770 Secondary and unspecified malignant neoplasm of lymph nodes of head, face and neck: Secondary | ICD-10-CM | POA: Diagnosis not present

## 2019-08-09 DIAGNOSIS — C7951 Secondary malignant neoplasm of bone: Secondary | ICD-10-CM | POA: Insufficient documentation

## 2019-08-09 DIAGNOSIS — Z0000 Encounter for general adult medical examination without abnormal findings: Secondary | ICD-10-CM

## 2019-08-09 DIAGNOSIS — C7931 Secondary malignant neoplasm of brain: Secondary | ICD-10-CM | POA: Insufficient documentation

## 2019-08-09 DIAGNOSIS — C50919 Malignant neoplasm of unspecified site of unspecified female breast: Secondary | ICD-10-CM

## 2019-08-09 LAB — COMPREHENSIVE METABOLIC PANEL
ALT: 21 U/L (ref 0–44)
AST: 38 U/L (ref 15–41)
Albumin: 3.7 g/dL (ref 3.5–5.0)
Alkaline Phosphatase: 75 U/L (ref 38–126)
Anion gap: 10 (ref 5–15)
BUN: 9 mg/dL (ref 6–20)
CO2: 29 mmol/L (ref 22–32)
Calcium: 8.9 mg/dL (ref 8.9–10.3)
Chloride: 104 mmol/L (ref 98–111)
Creatinine, Ser: 0.82 mg/dL (ref 0.44–1.00)
GFR calc Af Amer: >60 mL/min (ref >60)
GFR calc non Af Amer: >60 mL/min (ref >60)
Glucose, Bld: 98 mg/dL (ref 70–99)
Potassium: 3.5 mmol/L (ref 3.5–5.1)
Sodium: 143 mmol/L (ref 135–145)
Total Bilirubin: 0.4 mg/dL (ref 0.3–1.2)
Total Protein: 6.7 g/dL (ref 6.5–8.1)

## 2019-08-09 LAB — CBC WITH DIFFERENTIAL/PLATELET
Basophils Absolute: 0 10*3/uL (ref 0.0–0.1)
Basophils Relative: 0 %
Eosinophils Absolute: 0 10*3/uL (ref 0.0–0.5)
Eosinophils Relative: 1 %
HCT: 39.3 % (ref 36.0–46.0)
Hemoglobin: 12.7 g/dL (ref 12.0–15.0)
Lymphocytes Relative: 20 %
Lymphs Abs: 0.6 10*3/uL — ABNORMAL LOW (ref 0.7–4.0)
MCH: 29.2 pg (ref 26.0–34.0)
MCHC: 32.3 g/dL (ref 30.0–36.0)
MCV: 90.3 fL (ref 80.0–100.0)
Monocytes Absolute: 0.3 10*3/uL (ref 0.1–1.0)
Monocytes Relative: 8 %
Neutro Abs: 2.2 10*3/uL (ref 1.7–7.7)
Neutrophils Relative %: 71 %
Platelets: 49 10*3/uL — ABNORMAL LOW (ref 150–400)
RBC: 4.35 MIL/uL (ref 3.87–5.11)
RDW: 14.5 % (ref 11.5–15.5)
WBC: 3.1 10*3/uL — ABNORMAL LOW (ref 4.0–10.5)
nRBC: 0 % (ref 0.0–0.2)

## 2019-08-09 MED ORDER — DENOSUMAB 120 MG/1.7ML ~~LOC~~ SOLN
SUBCUTANEOUS | Status: AC
  Filled 2019-08-09: qty 1.7

## 2019-08-09 MED ORDER — SODIUM CHLORIDE 0.9% FLUSH
10.0000 mL | INTRAVENOUS | Status: DC | PRN
  Administered 2019-08-09: 10 mL
  Filled 2019-08-09: qty 10

## 2019-08-09 MED ORDER — ACETAMINOPHEN 325 MG PO TABS
650.0000 mg | ORAL_TABLET | Freq: Once | ORAL | Status: AC
  Administered 2019-08-09: 650 mg via ORAL

## 2019-08-09 MED ORDER — SODIUM CHLORIDE 0.9 % IV SOLN
Freq: Once | INTRAVENOUS | Status: AC
  Administered 2019-08-09: 09:00:00 via INTRAVENOUS
  Filled 2019-08-09: qty 250

## 2019-08-09 MED ORDER — DIPHENHYDRAMINE HCL 25 MG PO CAPS: 25.0000 mg | ORAL_CAPSULE | Freq: Once | ORAL | Status: DC

## 2019-08-09 MED ORDER — HEPARIN SOD (PORK) LOCK FLUSH 100 UNIT/ML IV SOLN
500.0000 [IU] | Freq: Once | INTRAVENOUS | Status: AC | PRN
  Administered 2019-08-09: 500 [IU]
  Filled 2019-08-09: qty 5

## 2019-08-09 MED ORDER — INFLUENZA VAC SPLIT QUAD 0.5 ML IM SUSY
0.5000 mL | PREFILLED_SYRINGE | Freq: Once | INTRAMUSCULAR | Status: AC
  Administered 2019-08-09: 0.5 mL via INTRAMUSCULAR
  Filled 2019-08-09: qty 0.5

## 2019-08-09 MED ORDER — ACETAMINOPHEN 325 MG PO TABS
ORAL_TABLET | ORAL | Status: AC
  Filled 2019-08-09: qty 2

## 2019-08-09 MED ORDER — SODIUM CHLORIDE 0.9 % IV SOLN
260.0000 mg | Freq: Once | INTRAVENOUS | Status: AC
  Administered 2019-08-09: 260 mg via INTRAVENOUS
  Filled 2019-08-09: qty 5

## 2019-08-09 MED ORDER — DENOSUMAB 120 MG/1.7ML ~~LOC~~ SOLN
120.0000 mg | Freq: Once | SUBCUTANEOUS | Status: AC
  Administered 2019-08-09: 120 mg via SUBCUTANEOUS

## 2019-08-09 NOTE — Patient Instructions (Signed)

## 2019-08-09 NOTE — Progress Notes (Signed)
[name]  [phone] Fax:(336) [phone]     ID: [name] DOB: 04-18-1961  MR#: [id number]  HWY#:[id number]  Patient Care Team: Shannon Rakes, MD as PCP - General (Family Medicine) Shannon Rudd, MD as Consulting Physician (Radiation Oncology) Shannon Levine, MD as Consulting Physician (Neurosurgery) Shannon Pilgrim, MD as Consulting Physician (Psychiatry) Shannon Skinner Acey Lav, MD as Consulting Physician (Psychiatry) Shannon Settle, MD as Referring Physician (Plastic Surgery) Shannon Limbo, MD as Consulting Physician (Plastic Surgery) [name], Shannon Lofty, DO as Attending Physician (Plastic Surgery)   CHIEF COMPLAINT: Metastatic triple positive breast cancer  CURRENT TREATMENT: T-DM1, denosumab, anastrozole   INTERVAL HISTORY: [name] was scheduled for treatment only today but she let me know that she wanted to be seen to discuss some issues going on.  She continues on TDM 1, which generally she tolerates well.  What worries her is the thrombocytopenia.  She says she has been getting bruising.  Particularly in the shin and the lateral aspect of the thighs.  She feels her balance is poor and she is banging into things.  She also continues on denosumab.  She has tolerated this well so far, with no dental issues to her knowledge.  She also continues on anastrozole.  Hot flashes and vaginal dryness are not major issues for her.  [name]'s last echocardiogram on 05/01/2019, showed an ejection fraction in the 60% - 65% range. She has these every 6 months.  Next echo will be due early December 2020   REVIEW OF SYSTEMS: [name] feels tired and short of breath but she says they have not been able to document hypoxia and so she does not have oxygen at home.  She wonders if that would be helpful.  She continues to work in Biomedical scientist, but she says her coworker does most of the work and she does does a little bit.  She cannot do very much.  She is looking forward  to visiting family over the winter holidays.  She denies unusual headaches, visual changes, nausea, or vomiting.  There is been no change in bowel or bladder habits.  There is been no frank bleeding.  A detailed review of systems today was otherwise stable    BREAST CANCER HISTORY: From the original intake note:  [name] was aware of a "lemon sized lump in" her left axilla for about a year before bringing it to medical attention. By then she had developed left breast and left axillary swelling (June 2016). She presented to the local emergency room and had a chest CT scan 06/06/2015 which showed a nodule in the left breast measuring 0.9 cm and questionable left axillary adenopathy. She then proceeded to bilateral diagnostic mammography and left breast ultrasonography 06/19/2015. There were no prior films for comparison (last mammography 12 years prior).. The breast density was category C. Mammography showed in the left breast upper inner quadrant a 7 cm area including a small mass and significant pleomorphic calcifications. Ultrasonography defined the mass as measuring 1.2 cm. The left axilla appeared unremarkable. There was significant skin edema.  Biopsy of the left breast mass 06/19/2015 showed ([id number]) an invasive ductal carcinoma, grade 2, estrogen receptor 83% positive, progesterone receptor 26% positive, and HER-2 amplified by immunohistochemstry with a 3+ reading. The patient had biopsies of a separate area in the left breast August of the same year and this showed atypical ductal hyperplasia. (SP [id number]).  Accordingly after appropriate discussion on 08/21/2015 the patient proceeded to left mastectomy with left axillary sentinel lymph  node sampling, which, since the lymph nodes were positive, extended to the procedure to left axillary lymph node dissection. The pathology ([id number]) showed an invasive ductal carcinoma, grade 3, measuring in excess of 9 cm. There were also skin satellites, not  contiguous with the invasive carcinoma. Margins were clear and ample. There was evidence of lymphovascular invasion. A total of 15 lymph nodes were removed, including 5 sentinel lymph nodes, all of which were positive, so that the final total was 14 out of 15 lymph nodes involved by tumor. There was evidence of extranodal extension. The final pathology was pT4b pN2a, stage IIIB  CA-27-29 and CEA 09/19/2015 were non-informative October 2016.  Unfortunately CT scans of the chest abdomen and pelvis 09/16/2015 showed bony metastases to the ri/ght scapula, left iliac crest, and also L4 and T-spine. There were questionable liver cysts which on repeat CT scan 03/02/2016 appear to be a little bit more well-defined, possibly a little larger. There were also some possible right upper lobe lung lesions.  Adjuvant treatment consisted of docetaxel, trastuzumab and pertuzumab, with the final (6th) docetaxel dose given 02/11/2016. She continues on trastuzumab and pertuzumab, with the 11th cycle given 05/05/2016. Echocardiogram 02/26/2016 showed an ejection fraction of 55%. She receives denosumab/Xgeva every 4 weeks.. She also receives radiation, started 06/09/201, to be completed 06/26/2016.  Her subsequent history is as detailed below   PAST MEDICAL HISTORY: Past Medical History:  Diagnosis Date   Alcohol abuse    Anemia    during chemo   Anxiety    At age 55   Arthritis Dx 2010   Bipolar disorder ([name])    Bronchitis    Cancer ([facility])    breast mets to brain   Chronic pain    Complication of anesthesia    Depression    Family history of adverse reaction to anesthesia    [name] had PONV   Fibromyalgia Dx 2005   GERD (gastroesophageal reflux disease)    Headache    hx  migraines   Lymphedema of left arm    Opiate dependence ([facility])    PONV (postoperative nausea and vomiting)    Port-A-Cath in place    PTSD (post-traumatic stress disorder)      PAST SURGICAL HISTORY: Past  Surgical History:  Procedure Laterality Date   APPLICATION OF CRANIAL NAVIGATION N/A 08/14/2016   Procedure: APPLICATION OF CRANIAL NAVIGATION;  Surgeon: Shannon Levine, MD;  Location: [facility];  Service: Neurosurgery;  Laterality: N/A;   BREAST RECONSTRUCTION Left    with silicone implant   COLONOSCOPY W/ POLYPECTOMY     CRANIOTOMY N/A 08/14/2016   Procedure: CRANIOTOMY TUMOR EXCISION WITH Lucky Rathke;  Surgeon: Shannon Levine, MD;  Location: [facility];  Service: Neurosurgery;  Laterality: N/A;   FIBULA FRACTURE SURGERY Left    MASTECTOMY Left    RADIOLOGY WITH ANESTHESIA N/A 07/23/2016   Procedure: MRI OF BRAIN WITH AND WITHOUT;  Surgeon: Medication Radiologist, MD;  Location: [facility];  Service: Radiology;  Laterality: N/A;   RADIOLOGY WITH ANESTHESIA N/A 09/08/2016   Procedure: MRI OF BRAIN WITH AND WITHOUT CONTRAST;  Surgeon: Medication Radiologist, MD;  Location: [facility];  Service: Radiology;  Laterality: N/A;   RADIOLOGY WITH ANESTHESIA N/A 12/10/2016   Procedure: MRI OF BRAIN WITH AND WITHOUT;  Surgeon: Medication Radiologist, MD;  Location: [facility];  Service: Radiology;  Laterality: N/A;   RADIOLOGY WITH ANESTHESIA N/A 03/02/2017   Procedure: MRI of BRAIN W and W/OUT CONTRAST;  Surgeon: Medication Radiologist, MD;  Location: [facility];  Service: Radiology;  Laterality: N/A;   RADIOLOGY WITH ANESTHESIA N/A 07/29/2017   Procedure: RADIOLOGY WITH ANESTHESIA MRI OF BRAIN WITH AND WITHOUT CONTRAST;  Surgeon: Radiologist, Medication, MD;  Location: [facility];  Service: Radiology;  Laterality: N/A;   RADIOLOGY WITH ANESTHESIA N/A 12/07/2017   Procedure: MRI WITH ANESTHESIA OF BRAIN WITH AND WITHOUT CONTRAST;  Surgeon: Radiologist, Medication, MD;  Location: [facility];  Service: Radiology;  Laterality: N/A;   RADIOLOGY WITH ANESTHESIA N/A 04/07/2018   Procedure: MRI OF BRAIN WITH AND WITHOUT CONTRAST;  Surgeon: Radiologist, Medication, MD;  Location: [facility];  Service: Radiology;  Laterality: N/A;    RADIOLOGY WITH ANESTHESIA N/A 08/23/2018   Procedure: MRI WITH ANESTHESIA OF THE BRAIN WITH AND WITHOUT;  Surgeon: Radiologist, Medication, MD;  Location: [facility];  Service: Radiology;  Laterality: N/A;   RADIOLOGY WITH ANESTHESIA N/A 01/24/2019   Procedure: MRI OF BRAIN WITH AND WITHOUT CONTRAST;  Surgeon: Radiologist, Medication, MD;  Location: [facility];  Service: Radiology;  Laterality: N/A;   RADIOLOGY WITH ANESTHESIA N/A 07/06/2019   Procedure: MRI WITH ANESTHESIA OF BRAIN WITH AND WITHOUT CONTRAST;  Surgeon: Radiologist, Medication, MD;  Location: [facility];  Service: Radiology;  Laterality: N/A;   right power port placement Right      FAMILY HISTORY Family History  Problem Relation Age of Onset   Diabetes [name]    Bipolar disorder [name]    CAD Father    The patient's father still living, age 63, in [place]. He had prostate cancer at some point in the past. The patient's [name] died at age 9 from complications of diabetes. The patient had no brothers, 2 sisters. A paternal grandmother had lung cancer in the setting of tobacco abuse. There is no other history of cancer in the family to her knowledge  GYNECOLOGIC HISTORY:  No LMP recorded. Patient has had an ablation. Menarche approximately age 5. First live birth in [birth year]. The patient is GX P2. She underwent endometrial ablation in 2016.  SOCIAL HISTORY: Updated August 2019 The patient is originally from [place]. She has lived in [place] before but more recently was in [place]. She is back here because she could not afford her rent in [place]. She is living here and a temporary situation. She is divorced. Her 2 children are [name] who lives in [place] and works as a Development worker, community, and [name] who also lives in [place] and works as a Catering manager. The patient has a grandchild, [name], 54 years old as of July 2019, living in [place] with his [name]. The patient also has a grandson born in February 2019, who also lives in  [place]. The patient has not established herself with a local church yet.    ADVANCED DIRECTIVES: Not in place; at the 06/03/2016 visit the patient was given the appropriate forms to complete and notarize at her discretion   HEALTH MAINTENANCE: Social History   Tobacco Use   Smoking status: Current Every Day Smoker    Packs/day: 0.25    Years: 43.00    Pack years: 10.75    Types: Cigarettes   Smokeless tobacco: Never Used  Substance Use Topics   Alcohol use: No    Comment: no ETOH since 08/22/12   Drug use: No    Comment: states she's in recovery program for 7 years     Colonoscopy:  PAP:  Bone density:   Allergies  Allergen Reactions   Demerol [Meperidine Hcl] Itching and Nausea And Vomiting   Erythromycin  Rash    Current Outpatient Medications on File Prior to Visit  Medication Sig Dispense Refill   anastrozole (ARIMIDEX) 1 MG tablet TAKE 1 TABLET BY MOUTH EVERY DAY (Patient taking differently: Take 1 mg by mouth daily. ) 90 tablet 1   Biotin 5 MG TABS Take 5 mg by mouth daily.      Cholecalciferol (VITAMIN D3) 50 MCG (2000 UT) capsule Take 2,000 Units by mouth daily.      ciprofloxacin (CIPRO) 500 MG tablet Take 1 tablet (500 mg total) by mouth 2 (two) times daily. 10 tablet 0   cyclobenzaprine (FLEXERIL) 10 MG tablet TAKE 1 TABLET BY MOUTH TWICE A DAY (Patient taking differently: Take 10 mg by mouth 2 (two) times daily. ) 60 tablet 1   cycloSPORINE (RESTASIS) 0.05 % ophthalmic emulsion Place 1 drop into both eyes 2 (two) times daily.     docusate sodium (COLACE) 100 MG capsule Take 100 mg by mouth daily.      fluticasone (FLONASE) 50 MCG/ACT nasal spray Place 2 sprays into both nostrils daily. 16 g 4   gabapentin (NEURONTIN) 300 MG capsule Take 1-2 capsules (300-600 mg total) by mouth See admin instructions. Take 300 mg by mouth in the morning and take 600 mg by mouth at bedtime 90 capsule 0   ibuprofen (ADVIL) 800 MG tablet TAKE 1 TABLET BY MOUTH THREE  TIMES A DAY 90 tablet 0   lamoTRIgine (LAMICTAL) 100 MG tablet Take 100 mg by mouth every morning.     loratadine (CLARITIN) 10 MG tablet Take 1 tablet (10 mg total) by mouth daily. 30 tablet 6   Lurasidone HCl (LATUDA) 60 MG TABS Take 60 mg by mouth daily.     ondansetron (ZOFRAN) 8 MG tablet TAKE 1 TABLET (8 MG TOTAL) BY MOUTH EVERY 8 (EIGHT) HOURS AS NEEDED FOR NAUSEA OR VOMITING. (Patient taking differently: Take 8 mg by mouth 2 (two) times daily. ) 90 tablet 1   pantoprazole (PROTONIX) 40 MG tablet Take 1 tablet (40 mg total) by mouth daily. 90 tablet 1   phenazopyridine (PYRIDIUM) 100 MG tablet Take 1 tablet (100 mg total) by mouth 3 (three) times daily as needed for pain. 15 tablet 0   traZODone (DESYREL) 100 MG tablet Take 1 tablet (100 mg total) by mouth at bedtime. (Patient taking differently: Take 300 mg by mouth at bedtime. ) 30 tablet 0   No current facility-administered medications on file prior to visit.      OBJECTIVE: Middle-aged white woman examined in a recliner There were no vitals filed for this visit.There is no height or weight on file to calculate BMI.  For vitals today please see the infusion room flowsheet  ECOG FS: 1 - Symptomatic but completely ambulatory   Sclerae unicteric, EOMs intact Wearing a mask No cervical or supraclavicular adenopathy Lungs no rales or rhonchi Heart regular rate and rhythm Abd soft, nontender, positive bowel sounds Neuro: nonfocal, well oriented, appropriate affect Breasts: Deferred Skin: No significant ecchymoses   LAB RESULTS: No results found for: LABCA2  CBC    Component Value Date/Time   WBC 3.1 (L) 08/09/2019 0824   RBC 4.35 08/09/2019 0824   HGB 12.7 08/09/2019 0824   HGB 13.5 07/05/2019 1129   HGB 12.1 10/26/2017 1038   HCT 39.3 08/09/2019 0824   HCT 35.7 10/26/2017 1038   PLT 49 (L) 08/09/2019 0824   PLT 54 (L) 07/05/2019 1129   PLT 167 10/26/2017 1038   MCV 90.3 08/09/2019 0824  MCV 98.4 10/26/2017  1038   MCH 29.2 08/09/2019 0824   MCHC 32.3 08/09/2019 0824   RDW 14.5 08/09/2019 0824   RDW 13.3 10/26/2017 1038   LYMPHSABS 0.6 (L) 08/09/2019 0824   LYMPHSABS 0.5 (L) 10/26/2017 1038   MONOABS 0.3 08/09/2019 0824   MONOABS 0.1 10/26/2017 1038   EOSABS 0.0 08/09/2019 0824   EOSABS 0.0 10/26/2017 1038   BASOSABS 0.0 08/09/2019 0824   BASOSABS 0.0 10/26/2017 1038   CMP Latest Ref Rng & Units 08/09/2019 07/18/2019 06/20/2019  Glucose 70 - 99 mg/dL 98 94 99  BUN 6 - 20 mg/dL 9 11 9   Creatinine 0.44 - 1.00 mg/dL 0.82 0.88 0.99  Sodium 135 - 145 mmol/L 143 142 140  Potassium 3.5 - 5.1 mmol/L 3.5 3.7 3.7  Chloride 98 - 111 mmol/L 104 105 103  CO2 22 - 32 mmol/L 29 27 26   Calcium 8.9 - 10.3 mg/dL 8.9 8.8(L) 9.2  Total Protein 6.5 - 8.1 g/dL 6.7 6.7 6.8  Total Bilirubin 0.3 - 1.2 mg/dL 0.4 0.4 0.3  Alkaline Phos 38 - 126 U/L 75 81 80  AST 15 - 41 U/L 38 34 39  ALT 0 - 44 U/L 21 22 32     STUDIES: No results found.  ELIGIBLE FOR AVAILABLE RESEARCH PROTOCOL: no  ASSESSMENT: 58 y.o. [place] woman with stage IV left-sided breast cancer involving bone and central nervous system  (1) s/p left breast lower inner quadrant biopsy 06/19/2015 for a clinical T2-3 NX invasive ductal carcinoma, grade 2, triple positive.  (2) status post left mastectomy and axillary lymph node dissection  with immediate expander placement 07/18/2015 for an mpT4 pN2,stage IIIB invasive ductal carcinoma, grade 3, with negative margins.  (a) definitive implant exchange to be scheduled in December   METASTATIC DISEASE: October 2016  (3) CT scan of the chest abdomen and pelvis  09/16/2015 shows metastatic lesions in the right scapula, left iliac crest, L4, and T spine. There were questionable liver cysts, with repeat CT scan 03/02/2016 showing possible right upper lobe lung lesions and possibly increased liver lesions  (a) CT scan of the chest 06/17/2016 shows no active disease in the lungs or liver  (b) Bone scan  July 2017 showed no evidence of bony metastatic disease   (c) head CT 07/08/2016 showed a cerebellar lesion, confirmed by MRI 07/23/2016, status post craniotomy 08/14/2016, confirming a metastatic deposit which was estrogen and progesterone receptor negative, HER-2 amplified with a signals ratio of 7.16, number per cell 13.25  (d) CA 27-29 is not informative  (4) received docetaxel every 3 weeks 6 together with trastuzumab and pertuzumab, last docetaxel dose 02/11/2016  (5) adjuvant radiation7/03/2016 to 06/26/2016 at [facility]: 1. The Left chest wall was treated to 23.4 Gy in 13 fractions at 1.8 Gy per fraction. 2. The Left chest wall was boosted to 10 Gy in 5 fractions at 2 Gy per fraction. 3. The Left Sclav/PAB was treated to 23.4 Gy in 13 fractions at 1.8 Gy per fraction.  [Note: Including the patient's treatment in [place] (received 15 fractions per Dr. Maryan Rued near [place], [place]), the patient received 50.4 Gy to the left chest wall and supraclavicular region. ]  (6) started trastuzumab and pertuzumab October 2016, continuing every 3 weeks,  (a) echocardiogram 02/26/2016 showed a well preserved ejection fraction  (b) echocardiogram 07/01/2016 shows an ejection fraction in the 60-65%   (c) pertuzumab discontinued 10/2016 with uncontrolled diarrhea  (d) echocardiogram 11/11/2016 showed an ejection fraction in the  60-65%  (e) echocardiogram 03/03/2017 shows an ejection fraction of 60-65%  (f) echocardiogram on 05/19/2017 shows an ejection fraction of 55-60%  (g) echocardiogram 09/24/2017 shows the ejection fraction in the 60-65%  (h) echocardiogram 02/14/2018 shows an ejection fraction in the 60-65%  (I) echocardiogram  06/30/2018 shows an ejection fraction in the 55-60%  (m) echocardiogram on 12/08/2018 shows an ejection fraction in the 60-65% range      (7) started denosumab/Xgeva October 2017 given every 4 weeks, transitioned to every 8 weeks beginning 10/11/18 (every 6 weeks while  giving TDM1 every 3 weeks)  (8) started anastrozole October 2017   (a) bone scan 11/10/2016 shows no active disease  (b) chest CT scan 11/10/2016 stable, with no evidence of active disease  (c) chest CT and bone scan 07/02/2017 show no evidence of active disease  (d) CT scan of the chest with contrast 11/10/2017 shows some left axillary edema, but no evidence of thrombosis or adenopathy in that area, 0.9 cm precarinal lymph and 0.7 cm right upper lobe nodule node; bone lesions stable  (e) CT of the chest 05/04/2018 shows a 1.4 cm right lower paratracheal node which is slightly increased and a new right prevascular mediastinal node measuring 0.7 cm.  Bone lesions are stable.  (f) chest CT on 07/01/2018 shows no definite findings of metastatic disease in the thorax. Previously noted borderline enlarged low right paratracheal lymph node is stable to slightly decreased in size   (e) chest CT on 12/30/2018 notes mild increase in right paratracheal adneopathy, recommended PET scan.  Pet scan on 01/19/2019 showed a hypermetabolic and enlarged right paratracheal lymph node consistent with breast cancer recurrence.  (f)Trastuzumab discontinued due to February 2020 scans   (g) TDM-1 started on 01/31/2019 given every 21 days.  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  (a) brain irradiation10/19/17-11/08/17: Whole brain/ 35 Gy in 14 fractions   (b) repeat brain MRI obtained 12/10/2016 shows no active disease in the brain  (c) repeat brain MRI 03/02/2017 shows no evidence of residual or  recurrent disease  (d) repeat brain MRI 07/29/2017 shows no evidence of residual or recurrent disease  (e) repeat brain MRI 12/07/2017 shows no evidence of disease recurrence.  There is progressive white matter change secondary to prior treatment.  (f) repeat brain MRI 04/07/2018 showed no evidence of disease\  (g) repeat brain MRI on 08/23/2018 shows no evidence of disease  (h) repeat brain MRI on 01/25/2019 shows no evidence of disease   PLAN: Tomoko is now just about 4 years out from definitive diagnosis of metastatic breast cancer.  Her disease is well controlled.  She is tolerating treatment well.  I reassured her that her platelet count while low is adequate.  I think the real problem is not so much low platelets but poor balance.  I think she would benefit from physical therapy for balance retraining and I have put that referral in for her.  Her brain metastases are followed through our neuro-oncology group and she has a follow-up appointment with Dr. Mickeal Skinner in February of next year.  We  are obtaining restaging studies for peripheral disease later this month and she already has an appointment to see me at that time.  She knows to call for any other issues that may develop before then.  Virgie Dad Layce Sprung  08/09/19 4:48 PM Medical Oncology and Hematology [facility] [address] [place], [place] [address] Tel. [phone]    Fax. [phone]

## 2019-08-09 NOTE — Patient Instructions (Signed)
[facility] Discharge Instructions for Patients Receiving Chemotherapy  Today you received the following chemotherapy agents [place]  To help prevent nausea and vomiting after your treatment, we encourage you to take your nausea medication as directed   If you develop nausea and vomiting that is not controlled by your nausea medication, call the clinic.   BELOW ARE SYMPTOMS THAT SHOULD BE REPORTED IMMEDIATELY:  *FEVER GREATER THAN 100.5 F  *CHILLS WITH OR WITHOUT FEVER  NAUSEA AND VOMITING THAT IS NOT CONTROLLED WITH YOUR NAUSEA MEDICATION  *UNUSUAL SHORTNESS OF BREATH  *UNUSUAL BRUISING OR BLEEDING  TENDERNESS IN MOUTH AND THROAT WITH OR WITHOUT PRESENCE OF ULCERS  *URINARY PROBLEMS  *BOWEL PROBLEMS  UNUSUAL RASH Items with * indicate a potential emergency and should be followed up as soon as possible.  Feel free to call the clinic should you have any questions or concerns. The clinic phone number is [phone].  Please show the CHEMO ALERT CARD at check-in to the Emergency Department and triage nurse.   

## 2019-08-09 NOTE — Progress Notes (Signed)
Patient seen by Dr. [name], and needs referral to PT to help with her balance.

## 2019-08-09 NOTE — Telephone Encounter (Signed)
Per MD - proceed with treatment today despite noted plts of 49,000.

## 2019-08-10 LAB — CANCER ANTIGEN 27.29: CA 27.29: 4.9 U/mL (ref 0.0–38.6)

## 2019-08-13 ENCOUNTER — Other Ambulatory Visit: Payer: Self-pay | Admitting: Oncology

## 2019-08-17 ENCOUNTER — Other Ambulatory Visit: Payer: Self-pay

## 2019-08-17 ENCOUNTER — Ambulatory Visit (HOSPITAL_COMMUNITY)
Admission: RE | Admit: 2019-08-17 | Discharge: 2019-08-17 | Disposition: A | Discharge status: Home / Self Care | Payer: Medicaid Other | Source: Ambulatory Visit | Attending: Oncology | Admitting: Oncology

## 2019-08-17 ENCOUNTER — Encounter (HOSPITAL_COMMUNITY): Payer: Self-pay

## 2019-08-17 DIAGNOSIS — C50312 Malignant neoplasm of lower-inner quadrant of left female breast: Secondary | ICD-10-CM | POA: Diagnosis present

## 2019-08-17 DIAGNOSIS — C770 Secondary and unspecified malignant neoplasm of lymph nodes of head, face and neck: Secondary | ICD-10-CM | POA: Insufficient documentation

## 2019-08-17 DIAGNOSIS — Z85841 Personal history of malignant neoplasm of brain: Secondary | ICD-10-CM

## 2019-08-17 DIAGNOSIS — C7951 Secondary malignant neoplasm of bone: Secondary | ICD-10-CM | POA: Insufficient documentation

## 2019-08-17 DIAGNOSIS — Z8589 Personal history of malignant neoplasm of other organs and systems: Secondary | ICD-10-CM

## 2019-08-17 MED ORDER — TECHNETIUM TC 99M MEDRONATE IV KIT
19.7000 | PACK | Freq: Once | INTRAVENOUS | Status: AC
  Administered 2019-08-17: 19.7 via INTRAVENOUS

## 2019-08-17 MED ORDER — SODIUM CHLORIDE (PF) 0.9 % IJ SOLN
INTRAMUSCULAR | Status: AC
  Filled 2019-08-17: qty 50

## 2019-08-17 MED ORDER — IOHEXOL 300 MG/ML  SOLN
75.0000 mL | Freq: Once | INTRAMUSCULAR | Status: AC | PRN
  Administered 2019-08-17: 75 mL via INTRAVENOUS

## 2019-08-21 ENCOUNTER — Other Ambulatory Visit: Payer: Self-pay | Admitting: *Deleted

## 2019-08-22 ENCOUNTER — Ambulatory Visit: Payer: Medicaid Other | Admitting: Physical Therapy

## 2019-08-22 ENCOUNTER — Telehealth: Payer: Self-pay

## 2019-08-22 ENCOUNTER — Other Ambulatory Visit: Payer: Self-pay

## 2019-08-22 ENCOUNTER — Encounter: Payer: Self-pay | Admitting: Physical Therapy

## 2019-08-22 DIAGNOSIS — M6281 Muscle weakness (generalized): Secondary | ICD-10-CM

## 2019-08-22 DIAGNOSIS — R2681 Unsteadiness on feet: Secondary | ICD-10-CM

## 2019-08-22 DIAGNOSIS — R2689 Other abnormalities of gait and mobility: Secondary | ICD-10-CM | POA: Diagnosis present

## 2019-08-22 NOTE — Therapy (Signed)
[facility] [address] [place], [place], [address] Phone: [phone]   Fax:  [phone]  Physical Therapy Evaluation  Patient Details  Name: [name] MRN: [id number] Date of Birth: 02-15-1961 Referring Provider (PT): Wilber Bihari, NP   Encounter Date: 08/22/2019  PT End of Session - 08/22/19 2053    Visit Number  1   New episode of care   Number of Visits  12    Date for PT Re-Evaluation  11/20/19    Authorization Type  Medicaid; request authorization (per email from Antony Salmon and Trudee Kuster 08/22/2019, >1 medicaid eval is allowed in a year)    PT Start Time  1104    PT Stop Time  1146    PT Time Calculation (min)  42 min    Activity Tolerance  Patient tolerated treatment well    Behavior During Therapy  [facility] for tasks assessed/performed       Past Medical History:  Diagnosis Date  . Alcohol abuse   . Anemia    during chemo  . Anxiety    At age 29  . Arthritis Dx 2010  . Bipolar disorder ([place])   . Bronchitis   . Cancer ([place])    breast mets to brain  . Chronic pain   . Complication of anesthesia   . Depression   . Family history of adverse reaction to anesthesia    MOther had PONV  . Fibromyalgia Dx 2005  . GERD (gastroesophageal reflux disease)   . Headache    hx  migraines  . Lymphedema of left arm   . Opiate dependence ([place])   . PONV (postoperative nausea and vomiting)   . Port-A-Cath in place   . PTSD (post-traumatic stress disorder)     Past Surgical History:  Procedure Laterality Date  . APPLICATION OF CRANIAL NAVIGATION N/A 08/14/2016   Procedure: APPLICATION OF CRANIAL NAVIGATION;  Surgeon: Erline Levine, MD;  Location: [facility];  Service: Neurosurgery;  Laterality: N/A;  . BREAST RECONSTRUCTION Left    with silicone implant  . COLONOSCOPY W/ POLYPECTOMY    . CRANIOTOMY N/A 08/14/2016   Procedure: CRANIOTOMY TUMOR EXCISION WITH Lucky Rathke;  Surgeon: Erline Levine, MD;   Location: [facility];  Service: Neurosurgery;  Laterality: N/A;  . FIBULA FRACTURE SURGERY Left   . MASTECTOMY Left   . RADIOLOGY WITH ANESTHESIA N/A 07/23/2016   Procedure: MRI OF BRAIN WITH AND WITHOUT;  Surgeon: Medication Radiologist, MD;  Location: [facility];  Service: Radiology;  Laterality: N/A;  . RADIOLOGY WITH ANESTHESIA N/A 09/08/2016   Procedure: MRI OF BRAIN WITH AND WITHOUT CONTRAST;  Surgeon: Medication Radiologist, MD;  Location: [facility];  Service: Radiology;  Laterality: N/A;  . RADIOLOGY WITH ANESTHESIA N/A 12/10/2016   Procedure: MRI OF BRAIN WITH AND WITHOUT;  Surgeon: Medication Radiologist, MD;  Location: [facility];  Service: Radiology;  Laterality: N/A;  . RADIOLOGY WITH ANESTHESIA N/A 03/02/2017   Procedure: MRI of BRAIN W and W/OUT CONTRAST;  Surgeon: Medication Radiologist, MD;  Location: [facility];  Service: Radiology;  Laterality: N/A;  . RADIOLOGY WITH ANESTHESIA N/A 07/29/2017   Procedure: RADIOLOGY WITH ANESTHESIA MRI OF BRAIN WITH AND WITHOUT CONTRAST;  Surgeon: Radiologist, Medication, MD;  Location: [facility];  Service: Radiology;  Laterality: N/A;  . RADIOLOGY WITH ANESTHESIA N/A 12/07/2017   Procedure: MRI WITH ANESTHESIA OF BRAIN WITH AND WITHOUT CONTRAST;  Surgeon: Radiologist, Medication, MD;  Location: [facility];  Service: Radiology;  Laterality: N/A;  .  RADIOLOGY WITH ANESTHESIA N/A 04/07/2018   Procedure: MRI OF BRAIN WITH AND WITHOUT CONTRAST;  Surgeon: Radiologist, Medication, MD;  Location: [facility];  Service: Radiology;  Laterality: N/A;  . RADIOLOGY WITH ANESTHESIA N/A 08/23/2018   Procedure: MRI WITH ANESTHESIA OF THE BRAIN WITH AND WITHOUT;  Surgeon: Radiologist, Medication, MD;  Location: [facility];  Service: Radiology;  Laterality: N/A;  . RADIOLOGY WITH ANESTHESIA N/A 01/24/2019   Procedure: MRI OF BRAIN WITH AND WITHOUT CONTRAST;  Surgeon: Radiologist, Medication, MD;  Location: [facility];  Service: Radiology;  Laterality: N/A;  . RADIOLOGY WITH ANESTHESIA N/A 07/06/2019   Procedure:  MRI WITH ANESTHESIA OF BRAIN WITH AND WITHOUT CONTRAST;  Surgeon: Radiologist, Medication, MD;  Location: [facility];  Service: Radiology;  Laterality: N/A;  . right power port placement Right     There were no vitals filed for this visit.   Subjective Assessment - 08/22/19 1110    Subjective  Pt reports history of chemo in 2016-2017 and then brain surgery with radiation.  She is in remission with her cancer; pt reports full scans are clear.  Pt has been seen for lymphedema at [facility], but reports that is "out of control".  Pt feels that her balance has been worsening over the summer.  She reports having near falls and stumbles and one episode coming off a curb where she hit her head.  She does not use assistive device.  She feels like she sways more.    Pertinent History  Per [facility] lymphedema eval 07/2019:  Treated here in March of last year currently with a reid sleeve, day sleeve and compression pump. Lt mastectomy and ALND with reconstruction 07/08/15 for T4N2 stage IIIB IDC. Metastatic disease in the brain s/p craniotomy 08/14/16. Chemo completed in 2017 Trastuzumab and Pertuzumab, docetaxel. Radiation completed 2017. Stable bone lesions to the Rt scapula, Lt iliac crest, L4, Tspine. Also probable in the liver and lungs. Pt is also bipolar and a recovered drug and alchohol user.  Current treatment TDM-1, denosumab and anastrozole.    Patient Stated Goals  To get more stability back in legs    Currently in Pain?  No/denies         [facility] PT Assessment - 08/22/19 1116      Assessment   Medical Diagnosis  balance impairment    Referring Provider (PT)  Wilber Bihari, NP    Onset Date/Surgical Date  08/09/19   MD visit; balance worsening over the summer 2020   Hand Dominance  Right    Prior Therapy  yes, lymphedema therapy at [facility], was approved for Medicaid through 08/09/2019      Precautions   Precautions  Fall    Precaution Comments  lymphedema, bone mets; no blood  pressure check on LUE      Balance Screen   Has the patient fallen in the past 6 months  No   Near falls, stumbles   Has the patient had a decrease in activity level because of a fear of falling?   Yes    Is the patient reluctant to leave their home because of a fear of falling?   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      Sensation   Light Touch  Appears Intact   to bilateral lower extremities     Posture/Postural Control   Posture/Postural Control  Postural limitations    Postural Limitations  Rounded Shoulders;Forward head      ROM / Strength   AROM / PROM / Strength  Strength      Strength   Overall Strength  Deficits    Strength Assessment Site  Hip;Knee;Ankle    Right/Left Hip  Right;Left    Right Hip Flexion  4/5    Left Hip Flexion  4/5    Right/Left Knee  Right;Left    Right Knee Flexion  4/5    Right Knee Extension  3+/5    Left Knee Flexion  3+/5    Left Knee Extension  4/5    Right/Left Ankle  Right;Left    Right Ankle Dorsiflexion  3+/5    Left Ankle Dorsiflexion  3+/5      Transfers   Transfers  Sit to Stand;Stand to Sit    Sit to Stand  6: Modified independent (Device/Increase time);Without upper extremity assist;From chair/3-in-1    Five time sit to stand comments   20.68    Stand to Sit  6: Modified independent (Device/Increase time);Without upper extremity assist;To chair/3-in-1      Ambulation/Gait   Ambulation/Gait  Yes    Ambulation/Gait Assistance  5: Supervision    Ambulation/Gait Assistance Details  200    Ambulation Distance (Feet)  200 Feet     Assistive device  None    Gait Pattern  Step-through pattern;Decreased arm swing - right;Decreased arm swing - left;Decreased step length - right;Decreased step length - left;Shuffle;Decreased dorsiflexion - right;Decreased dorsiflexion - left;Poor foot clearance - left;Poor foot clearance - right;Decreased trunk rotation    Ambulation Surface  Level;Indoor    Gait velocity  16.78 sec = 1.95 ft/sec      Standardized Balance Assessment   Standardized Balance Assessment  Dynamic Gait Index;Timed Up and Go Test      Dynamic Gait Index   Level Surface  Moderate Impairment    Change in Gait Speed  Moderate Impairment    Gait with Horizontal Head Turns  Moderate Impairment    Gait with Vertical Head Turns  Severe Impairment    Gait and Pivot Turn  Normal    Step Over Obstacle  Moderate Impairment    Step Around Obstacles  Mild Impairment    Steps  Mild Impairment    Total Score  11    DGI comment:  Scores <19/24 indicate increased fall risk       Timed Up and Go Test   Normal TUG (seconds)  17.19    TUG Comments  Scores >13.5 sec indicate increased fall risk.      High Level Balance   High Level Balance Comments  Pt stands EO and EC on solid surface x 30 seconds; pt stands EO 30 sec on foam with increased sway; pt stands <10 sec on foam EC prior to reaching out for support.  Indicates potential decreased use of vestibular system for balance                Objective measurements completed  on examination: See above findings.                PT Short Term Goals - 08/22/19 2103      PT SHORT TERM GOAL #1   Title  Pt will be independent with HEP to address balance, strength, and gait.  TARGET 3 weeks (09/22/2019)    Baseline  No current HEP for balance    Time  3    Period  Weeks    Status  New    Target Date  09/22/19      PT SHORT TERM GOAL #2   Title  Pt will improve TUG score to less than or equal to 13.5 seconds for decreased fall risk.    Baseline  TUG  score 17.19 seconds (>13.5 seconds indicates increased fall risk)    Time  3    Period  Weeks    Status  New    Target Date  09/22/19      PT SHORT TERM GOAL #3   Title  Pt will verbalize fall prevention in home envrionment.    Baseline  At fall risk per TUG, DGI scores    Time  3    Period  Weeks    Status  New    Target Date  09/22/19        PT Long Term Goals - 08/22/19 2109      PT LONG TERM GOAL #1   Title  Pt will be independent with progression of balance exercise program for decreased fall risk.  TARGET 10/20/2019    Baseline  No formal balance HEP    Time  7    Period  Weeks    Status  New    Target Date  10/20/19      PT LONG TERM GOAL #2   Title  Pt will improve DGI score to at least 16/24 for decreased fall risk.    Baseline  DGI score 11/24 (Scores <19/24 indicate increased fall risk)    Time  7    Status  New    Target Date  10/20/19      PT LONG TERM GOAL #3   Title  Pt will improve gait velocity to at least 2.3 ft/sec for improved gait efficiency and safety.    Baseline  Gait velocity 1.95 ft/sec (1.31-2.62 f/tsec indicates limited community ambulator)    Time  7    Period  Weeks    Status  New    Target Date  10/20/19      PT LONG TERM GOAL #4   Title  Pt will negotiate 1 curb step to simulate step up into home, no rail, independentlyf or improved safety with curbs/home entry.    Baseline  Pt reports one near fall with curb step; unable to perform stepping over obstacle without stopping, SLS <1 second each leg    Time  7    Period  Weeks    Status  New    Target Date  10/20/19             Plan - 08/22/19 2055    Clinical Impression Statement  Pt is a 58 year old female with history of mestatic breast cancer to brain and bone, with pt reporting cancer currently in remission.  Over the course of treatment, she received chemo and radiation.  She presents today for OPPT evaluation for balance.  She presents with decreased balance, decreased  muscle strength, decreased timing and coordiantion  with gait (shuffling gait pattern and decreased arm swing), slowed transfers, abnormal posture.  She is at fall risk per DGI, TUG measures and demonstrates gait velocity as limited community ambulator.  She was previously working in Biomedical scientist, but is not unable to perform due to balance issues.  She would benefit from skilled PT to address the above stated deficits for decreased fall risk and overall improved functional mobility.    Personal Factors and Comorbidities  Comorbidity 3+    Comorbidities  See PMH (subjective); metastatic breast cancer, hx of bipolar and anxiety    Examination-Activity Limitations  Stairs   Curbs   Examination-Participation Restrictions  --   Work   Stability/Clinical Decision Making  Stable/Uncomplicated    Clinical Decision Making  Low    Rehab Potential  Good    PT Frequency  Other (comment)   1x/wk for 3 weeks, then 2x/wk for 4 weeks   PT Duration  --   7 weeks total POC   PT Treatment/Interventions  ADLs/Self Care Home Management;Neuromuscular re-education;Gait training;Stair training;Patient/family education;Therapeutic activities;Functional mobility training;Therapeutic exercise;Balance training;DME Instruction    PT Next Visit Plan  Initiate balance exercises for HEP, gait training for improved foot clearance; curb and step negotiation, fall prevention education    Recommended Other Services  Please note that pt was evaluated by [facility] PT for lymphedema, was approved for 3 visits through 08/09/2019, but did not show for last scheduled visit       Patient will benefit from skilled therapeutic intervention in order to improve the following deficits and impairments:  Abnormal gait, Difficulty walking, Decreased mobility, Decreased strength, Decreased balance  Visit Diagnosis: Unsteadiness on feet  Other abnormalities of gait and mobility  Muscle weakness (generalized)     Problem List Patient  Active Problem List   Diagnosis Date Noted  . Thrombocytopenia ([place]) 08/09/2019  . Port-A-Cath in place 06/20/2019  . 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 S/P mastectomy, left 12/09/2018  . S/P breast reconstruction, left 12/09/2018  . S/P radiation therapy 12/09/2018  . GERD (gastroesophageal reflux disease) 12/10/2017  . Edema 11/09/2017  . Sensorineural hearing loss (SNHL) of both ears 11/05/2017  . Vitamin D deficiency 01/18/2017  . Bipolar I disorder, most recent episode depressed ([place]) 12/08/2016  . Adjustment disorder with anxiety 12/06/2016  . Metastatic breast cancer ([place])   . History of cancer metastatic to brain 07/27/2016  . Iron deficiency anemia 06/26/2016  . Bone metastases ([place]) 06/03/2016  . Primary cancer of lower-inner quadrant of left female breast ([place]) 06/01/2016  . Current smoker 03/28/2015  . Seasonal allergies 03/28/2015  . Anxiety state 02/28/2015  . Fibromyalgia 02/28/2015  . Family history of diabetes mellitus 02/28/2015  . H/O alcohol abuse     Jaira Canady W. 08/22/2019, 9:24 PM  Frazier Butt., PT   [facility] [address] [place], [place], [address] Phone: [phone]   Fax:  [phone]  Name: [name] MRN: [id number] Date of Birth: [birth year]

## 2019-08-22 NOTE — Telephone Encounter (Signed)
-----   Message from Gardenia Phlegm, NP sent at 08/17/2019 12:12 PM EDT ----- Please let patient know everything looks good on scan.  No areas of concern.  The area that was slightly enlarged is no longer present.   ----- Message ----- From: Interface, Rad Results In Sent: 08/17/2019  12:05 PM EDT To: Chauncey Cruel, MD

## 2019-08-22 NOTE — Telephone Encounter (Signed)
Spoke with patient to inform that everything on her scan looks good, no concerns.  Enlarged area is no longer present.  Patient voiced understanding and thanks for call.

## 2019-08-28 ENCOUNTER — Telehealth: Payer: Self-pay | Admitting: Oncology

## 2019-08-28 NOTE — Telephone Encounter (Signed)
Returned patient's phone call regarding rescheduling an appointment, left a voicemail. 

## 2019-08-29 ENCOUNTER — Other Ambulatory Visit: Payer: Medicaid Other

## 2019-08-29 ENCOUNTER — Other Ambulatory Visit: Payer: Self-pay | Admitting: Family Medicine

## 2019-08-29 ENCOUNTER — Ambulatory Visit: Payer: Medicaid Other | Admitting: Oncology

## 2019-08-29 ENCOUNTER — Other Ambulatory Visit: Payer: Self-pay | Admitting: Oncology

## 2019-08-29 ENCOUNTER — Ambulatory Visit: Payer: Medicaid Other

## 2019-08-29 DIAGNOSIS — J302 Other seasonal allergic rhinitis: Secondary | ICD-10-CM

## 2019-08-29 NOTE — Progress Notes (Signed)
[name]  [phone] [phone]     ID: Jerilyn Gillaspie DOB: [birth year]  MR#: [id number]  YHC#:[id number]  Patient Care Team: Charlott Rakes, MD as PCP - General (Family Medicine) Kyung Rudd, MD as Consulting Physician (Radiation Oncology) Erline Levine, MD as Consulting Physician (Neurosurgery) Corena Pilgrim, MD as Consulting Physician (Psychiatry) Mickeal Skinner Acey Lav, MD as Consulting Physician (Psychiatry) Nehemiah Settle, MD as Referring Physician (Plastic Surgery) Irene Limbo, MD as Consulting Physician (Plastic Surgery) Dillingham, Loel Lofty, DO as Attending Physician (Plastic Surgery)   CHIEF COMPLAINT: Metastatic triple positive breast cancer  CURRENT TREATMENT: T-DM1, denosumab, anastrozole  INTERVAL HISTORY: Lusia was scheduled for treatment only today but she let me know that she wanted to be seen to discuss some issues going on.  She continues on TDM 1, which generally she tolerates well.  She has some thrombocytopenia and her platelet count is 41.     She also continues on denosumab.  She has tolerated this well so far, with no dental issues to her knowledge.  She also continues on anastrozole.  Hot flashes and vaginal dryness are not major issues for her.  Since her last visit she underwent restaging with a bone scan and CT chest on 08/17/2019.  The bone scan showed no evidence of metastatic disease.  The CT chest showed resolution of previous thoracic adenopathy.  REVIEW OF SYSTEMS: Ravonda was referred to PT due to balance issues.  She went at the end of last week.  She is scheduled to return tomorrow, or Friday.  She is feeling well otherwise.  She has no fever, chills, chest pain, palpitations, cough, shortness of breath.  She is without any nausea, vomiting, bowel/bladder changes, skin changes.  She notes bruising is improved with removing advil from her PR med list and taking tylenol instead.  A detailed ROS was otherwise  non contributory.     BREAST CANCER HISTORY: From the original intake note:  [name] was aware of a "lemon sized lump in" her left axilla for about a year before bringing it to medical attention. By then she had developed left breast and left axillary swelling (June 2016). She presented to the local emergency room and had a chest CT scan 06/06/2015 which showed a nodule in the left breast measuring 0.9 cm and questionable left axillary adenopathy. She then proceeded to bilateral diagnostic mammography and left breast ultrasonography 06/19/2015. There were no prior films for comparison (last mammography 12 years prior).. The breast density was category C. Mammography showed in the left breast upper inner quadrant a 7 cm area including a small mass and significant pleomorphic calcifications. Ultrasonography defined the mass as measuring 1.2 cm. The left axilla appeared unremarkable. There was significant skin edema.  Biopsy of the left breast mass 06/19/2015 showed ([id number]) an invasive ductal carcinoma, grade 2, estrogen receptor 83% positive, progesterone receptor 26% positive, and HER-2 amplified by immunohistochemstry with a 3+ reading. The patient had biopsies of a separate area in the left breast August of the same year and this showed atypical ductal hyperplasia. (SP [id number]).  Accordingly after appropriate discussion on 08/21/2015 the patient proceeded to left mastectomy with left axillary sentinel lymph node sampling, which, since the lymph nodes were positive, extended to the procedure to left axillary lymph node dissection. The pathology ([id number]) showed an invasive ductal carcinoma, grade 3, measuring in excess of 9 cm. There were also skin satellites, not contiguous with the invasive carcinoma. Margins were clear and ample. There  was evidence of lymphovascular invasion. A total of 15 lymph nodes were removed, including 5 sentinel lymph nodes, all of which were positive, so that the final  total was 14 out of 15 lymph nodes involved by tumor. There was evidence of extranodal extension. The final pathology was pT4b pN2a, stage IIIB  CA-27-29 and CEA 09/19/2015 were non-informative October 2016.  Unfortunately CT scans of the chest abdomen and pelvis 09/16/2015 showed bony metastases to the ri/ght scapula, left iliac crest, and also L4 and T-spine. There were questionable liver cysts which on repeat CT scan 03/02/2016 appear to be a little bit more well-defined, possibly a little larger. There were also some possible right upper lobe lung lesions.  Adjuvant treatment consisted of docetaxel, trastuzumab and pertuzumab, with the final (6th) docetaxel dose given 02/11/2016. She continues on trastuzumab and pertuzumab, with the 11th cycle given 05/05/2016. Echocardiogram 02/26/2016 showed an ejection fraction of 55%. She receives denosumab/Xgeva every 4 weeks.. She also receives radiation, started 06/09/201, to be completed 06/26/2016.  Her subsequent history is as detailed below   PAST MEDICAL HISTORY: Past Medical History:  Diagnosis Date  . Alcohol abuse   . Anemia    during chemo  . Anxiety    At age 28  . Arthritis Dx 2010  . Bipolar disorder ([place])   . Bronchitis   . Cancer ([place])    breast mets to brain  . Chronic pain   . Complication of anesthesia   . Depression   . Family history of adverse reaction to anesthesia    MOther had PONV  . Fibromyalgia Dx 2005  . GERD (gastroesophageal reflux disease)   . Headache    hx  migraines  . Lymphedema of left arm   . Opiate dependence ([place])   . PONV (postoperative nausea and vomiting)   . Port-A-Cath in place   . PTSD (post-traumatic stress disorder)      PAST SURGICAL HISTORY: Past Surgical History:  Procedure Laterality Date  . APPLICATION OF CRANIAL NAVIGATION N/A 08/14/2016   Procedure: APPLICATION OF CRANIAL NAVIGATION;  Surgeon: Erline Levine, MD;  Location: [facility];  Service: Neurosurgery;  Laterality: N/A;   . BREAST RECONSTRUCTION Left    with silicone implant  . COLONOSCOPY W/ POLYPECTOMY    . CRANIOTOMY N/A 08/14/2016   Procedure: CRANIOTOMY TUMOR EXCISION WITH Lucky Rathke;  Surgeon: Erline Levine, MD;  Location: [facility];  Service: Neurosurgery;  Laterality: N/A;  . FIBULA FRACTURE SURGERY Left   . MASTECTOMY Left   . RADIOLOGY WITH ANESTHESIA N/A 07/23/2016   Procedure: MRI OF BRAIN WITH AND WITHOUT;  Surgeon: Medication Radiologist, MD;  Location: [facility];  Service: Radiology;  Laterality: N/A;  . RADIOLOGY WITH ANESTHESIA N/A 09/08/2016   Procedure: MRI OF BRAIN WITH AND WITHOUT CONTRAST;  Surgeon: Medication Radiologist, MD;  Location: [facility];  Service: Radiology;  Laterality: N/A;  . RADIOLOGY WITH ANESTHESIA N/A 12/10/2016   Procedure: MRI OF BRAIN WITH AND WITHOUT;  Surgeon: Medication Radiologist, MD;  Location: [facility];  Service: Radiology;  Laterality: N/A;  . RADIOLOGY WITH ANESTHESIA N/A 03/02/2017   Procedure: MRI of BRAIN W and W/OUT CONTRAST;  Surgeon: Medication Radiologist, MD;  Location: [facility];  Service: Radiology;  Laterality: N/A;  . RADIOLOGY WITH ANESTHESIA N/A 07/29/2017   Procedure: RADIOLOGY WITH ANESTHESIA MRI OF BRAIN WITH AND WITHOUT CONTRAST;  Surgeon: Radiologist, Medication, MD;  Location: [facility];  Service: Radiology;  Laterality: N/A;  . RADIOLOGY WITH ANESTHESIA N/A 12/07/2017  Procedure: MRI WITH ANESTHESIA OF BRAIN WITH AND WITHOUT CONTRAST;  Surgeon: Radiologist, Medication, MD;  Location: [facility];  Service: Radiology;  Laterality: N/A;  . RADIOLOGY WITH ANESTHESIA N/A 04/07/2018   Procedure: MRI OF BRAIN WITH AND WITHOUT CONTRAST;  Surgeon: Radiologist, Medication, MD;  Location: [facility];  Service: Radiology;  Laterality: N/A;  . RADIOLOGY WITH ANESTHESIA N/A 08/23/2018   Procedure: MRI WITH ANESTHESIA OF THE BRAIN WITH AND WITHOUT;  Surgeon: Radiologist, Medication, MD;  Location: [facility];  Service: Radiology;  Laterality: N/A;  . RADIOLOGY WITH ANESTHESIA N/A 01/24/2019    Procedure: MRI OF BRAIN WITH AND WITHOUT CONTRAST;  Surgeon: Radiologist, Medication, MD;  Location: [facility];  Service: Radiology;  Laterality: N/A;  . RADIOLOGY WITH ANESTHESIA N/A 07/06/2019   Procedure: MRI WITH ANESTHESIA OF BRAIN WITH AND WITHOUT CONTRAST;  Surgeon: Radiologist, Medication, MD;  Location: [facility];  Service: Radiology;  Laterality: N/A;  . right power port placement Right      FAMILY HISTORY Family History  Problem Relation Age of Onset  . Diabetes Mother   . Bipolar disorder Mother   . CAD Father    The patient's father still living, age 53, in [place]. He had prostate cancer at some point in the past. The patient's mother died at age 72 from complications of diabetes. The patient had no brothers, 2 sisters. A paternal grandmother had lung cancer in the setting of tobacco abuse. There is no other history of cancer in the family to her knowledge  GYNECOLOGIC HISTORY:  No LMP recorded. Patient has had an ablation. Menarche approximately age 9. First live birth in 29. The patient is GX P2. She underwent endometrial ablation in 2016.  SOCIAL HISTORY: Updated August 2019 The patient is originally from [place]. She has lived in [place] before but more recently was in [place]. She is back here because she could not afford her rent in [place]. She is living here and a temporary situation. She is divorced. Her 2 children are [name] who lives in [place] and works as a Development worker, community, and [name] who also lives in [place] and works as a Catering manager. The patient has a grandchild, Arelia Longest, 65 years old as of July 2019, living in [place] with his mother. The patient also has a grandson born in February 2019, who also lives in [place]. The patient has not established herself with a local church yet.    ADVANCED DIRECTIVES: Not in place; at the 06/03/2016 visit the patient was given the appropriate forms to complete and notarize at her discretion   HEALTH  MAINTENANCE: Social History   Tobacco Use  . Smoking status: Current Every Day Smoker    Packs/day: 0.25    Years: 43.00    Pack years: 10.75    Types: Cigarettes  . Smokeless tobacco: Never Used  Substance Use Topics  . Alcohol use: No    Comment: no ETOH since 08/22/12  . Drug use: No    Comment: states she's in recovery program for 7 years     Colonoscopy:  PAP:  Bone density:   Allergies  Allergen Reactions  . Demerol [Meperidine Hcl] Itching and Nausea And Vomiting  . Erythromycin Rash    Current Outpatient Medications on File Prior to Visit  Medication Sig Dispense Refill  . anastrozole (ARIMIDEX) 1 MG tablet TAKE 1 TABLET BY MOUTH EVERY DAY (Patient taking differently: Take 1 mg by mouth daily. ) 90 tablet 1  . Biotin 5 MG TABS Take 5 mg  by mouth daily.     . Cholecalciferol (VITAMIN D3) 50 MCG (2000 UT) capsule Take 2,000 Units by mouth daily.     . ciprofloxacin (CIPRO) 500 MG tablet Take 1 tablet (500 mg total) by mouth 2 (two) times daily. (Patient not taking: Reported on 08/22/2019) 10 tablet 0  . cyclobenzaprine (FLEXERIL) 10 MG tablet TAKE 1 TABLET BY MOUTH TWICE A DAY (Patient taking differently: Take 10 mg by mouth 2 (two) times daily. ) 60 tablet 1  . cycloSPORINE (RESTASIS) 0.05 % ophthalmic emulsion Place 1 drop into both eyes 2 (two) times daily.    [name] docusate sodium (COLACE) 100 MG capsule Take 100 mg by mouth daily.     . fluticasone (FLONASE) 50 MCG/ACT nasal spray Place 2 sprays into both nostrils daily. 16 g 4  . gabapentin (NEURONTIN) 300 MG capsule Take 1-2 capsules (300-600 mg total) by mouth See admin instructions. Take 300 mg by mouth in the morning and take 600 mg by mouth at bedtime 90 capsule 0  . ibuprofen (ADVIL) 800 MG tablet TAKE 1 TABLET BY MOUTH THREE TIMES A DAY 90 tablet 0  . lamoTRIgine (LAMICTAL) 100 MG tablet Take 100 mg by mouth every morning.    . loratadine (CLARITIN) 10 MG tablet Take 1 tablet (10 mg total) by mouth daily. 30  tablet 6  . Lurasidone HCl (LATUDA) 60 MG TABS Take 60 mg by mouth daily.    . ondansetron (ZOFRAN) 8 MG tablet TAKE 1 TABLET (8 MG TOTAL) BY MOUTH EVERY 8 (EIGHT) HOURS AS NEEDED FOR NAUSEA OR VOMITING. 90 tablet 1  . pantoprazole (PROTONIX) 40 MG tablet Take 1 tablet (40 mg total) by mouth daily. 90 tablet 1  . phenazopyridine (PYRIDIUM) 100 MG tablet Take 1 tablet (100 mg total) by mouth 3 (three) times daily as needed for pain. (Patient not taking: Reported on 08/22/2019) 15 tablet 0  . traZODone (DESYREL) 100 MG tablet Take 1 tablet (100 mg total) by mouth at bedtime. (Patient taking differently: Take 300 mg by mouth at bedtime. ) 30 tablet 0   No current facility-administered medications on file prior to visit.      OBJECTIVE: Middle-aged white woman examined in a recliner Vitals:   08/30/19 0954  BP: 120/83  Pulse: 90  Resp: 16  Temp: 97.8 F (36.6 C)  TempSrc: Temporal  SpO2: 100%  Weight: 150 lb 3.2 oz (68.1 kg)  Height: 5' 5"  (1.651 m)  Body mass index is 24.99 kg/m.  For vitals today please see the infusion room flowsheet  ECOG FS: 1 - Symptomatic but completely ambulatory  GENERAL: Patient is a well appearing female in no acute distress HEENT:  Sclerae anicteric. Mask in place. Neck is supple.  NODES:  No cervical, supraclavicular, or axillary lymphadenopathy palpated.  BREAST EXAM:  Deferred. LUNGS:  Clear to auscultation bilaterally.  No wheezes or rhonchi. HEART:  Regular rate and rhythm. No murmur appreciated. ABDOMEN:  Soft, nontender.  Positive, normoactive bowel sounds. No organomegaly palpated. MSK:  No focal spinal tenderness to palpation. Full range of motion bilaterally in the upper extremities. EXTREMITIES:  No peripheral edema.   SKIN:  Clear with no obvious rashes or skin changes. No nail dyscrasia. NEURO:  Nonfocal. Well oriented.  Appropriate affect.     LAB RESULTS: No results found for: LABCA2  CBC    Component Value Date/Time   WBC 2.7  (L) 08/30/2019 0916   RBC 4.35 08/30/2019 0916   HGB 12.4 08/30/2019 0916  HGB 13.5 07/05/2019 1129   HGB 12.1 10/26/2017 1038   HCT 38.8 08/30/2019 0916   HCT 35.7 10/26/2017 1038   PLT 41 (L) 08/30/2019 0916   PLT 54 (L) 07/05/2019 1129   PLT 167 10/26/2017 1038   MCV 89.2 08/30/2019 0916   MCV 98.4 10/26/2017 1038   MCH 28.5 08/30/2019 0916   MCHC 32.0 08/30/2019 0916   RDW 14.7 08/30/2019 0916   RDW 13.3 10/26/2017 1038   LYMPHSABS 0.7 08/30/2019 0916   LYMPHSABS 0.5 (L) 10/26/2017 1038   MONOABS 0.3 08/30/2019 0916   MONOABS 0.1 10/26/2017 1038   EOSABS 0.0 08/30/2019 0916   EOSABS 0.0 10/26/2017 1038   BASOSABS 0.0 08/30/2019 0916   BASOSABS 0.0 10/26/2017 1038   CMP Latest Ref Rng & Units 08/09/2019 07/18/2019 06/20/2019  Glucose 70 - 99 mg/dL 98 94 99  BUN 6 - 20 mg/dL 9 11 9   Creatinine 0.44 - 1.00 mg/dL 0.82 0.88 0.99  Sodium 135 - 145 mmol/L 143 142 140  Potassium 3.5 - 5.1 mmol/L 3.5 3.7 3.7  Chloride 98 - 111 mmol/L 104 105 103  CO2 22 - 32 mmol/L 29 27 26   Calcium 8.9 - 10.3 mg/dL 8.9 8.8(L) 9.2  Total Protein 6.5 - 8.1 g/dL 6.7 6.7 6.8  Total Bilirubin 0.3 - 1.2 mg/dL 0.4 0.4 0.3  Alkaline Phos 38 - 126 U/L 75 81 80  AST 15 - 41 U/L 38 34 39  ALT 0 - 44 U/L 21 22 32     STUDIES: Ct Chest W Contrast  Result Date: 08/17/2019 CLINICAL DATA:  Follow-up metastatic breast cancer to the bone and brain. EXAM: CT CHEST WITH CONTRAST TECHNIQUE: Multidetector CT imaging of the chest was performed during intravenous contrast administration. CONTRAST:  65m OMNIPAQUE IOHEXOL 300 MG/ML  SOLN COMPARISON:  05/15/2019 FINDINGS: Cardiovascular: The heart size appears normal. No pericardial effusion. Mediastinum/Nodes: Small thyroid nodules are again noted measuring to 8 mm. The trachea appears patent and is midline. Normal appearance of the esophagus. No enlarged supraclavicular, axillary, mediastinal, or hilar lymph nodes. Lungs/Pleura: No pleural effusion. Bilateral lower  lobe linear densities are again noted compatible with scar. No suspicious pulmonary nodule. Upper Abdomen: Unchanged appearance of scattered low-attenuation liver foci. No acute abnormality noted. Musculoskeletal: Unchanged appearance of sclerotic metastasis within the thoracic spine. Prior left mastectomy and implant reconstruction. IMPRESSION: 1. Stable exam. No change in scattered sclerotic metastasis in the thoracic spine. 2. No suspicious pulmonary nodule or mass. Resolution of previous thoracic adenopathy. Electronically Signed   By: TKerby MoorsM.D.   On: 08/17/2019 12:03   Nm Bone Scan Whole Body  Result Date: 08/18/2019 CLINICAL DATA:  Breast cancer. EXAM: NUCLEAR MEDICINE WHOLE BODY BONE SCAN TECHNIQUE: Whole body anterior and posterior images were obtained approximately 3 hours after intravenous injection of radiopharmaceutical. RADIOPHARMACEUTICALS:  19.7 mCi Technetium-960mDP IV COMPARISON:  CT 08/17/2019 05/15/2019. PET scan 01/19/2019. Bone scan report 07/02/2017. FINDINGS: Bilateral renal function excretion. Photopenic area over the left chest consistent with left breast implant. No focal bony abnormalities to suggest metastatic disease identified. IMPRESSION: Negative exam. Electronically Signed   By: ThMarcello MooresRegister   On: 08/18/2019 05:41    ELIGIBLE FOR AVAILABLE RESEARCH PROTOCOL: no  ASSESSMENT: [age].o. [place] woman with stage IV left-sided breast cancer involving bone and central nervous system  (1) s/p left breast lower inner quadrant biopsy 06/19/2015 for a clinical T2-3 NX invasive ductal carcinoma, grade 2, triple positive.  (2) status post left mastectomy and  axillary lymph node dissection  with immediate expander placement 07/18/2015 for an mpT4 pN2,stage IIIB invasive ductal carcinoma, grade 3, with negative margins.  (a) definitive implant exchange to be scheduled in December   METASTATIC DISEASE: October 2016  (3) CT scan of the chest abdomen and pelvis   09/16/2015 shows metastatic lesions in the right scapula, left iliac crest, L4, and T spine. There were questionable liver cysts, with repeat CT scan 03/02/2016 showing possible right upper lobe lung lesions and possibly increased liver lesions  (a) CT scan of the chest 06/17/2016 shows no active disease in the lungs or liver  (b) Bone scan July 2017 showed no evidence of bony metastatic disease   (c) head CT 07/08/2016 showed a cerebellar lesion, confirmed by MRI 07/23/2016, status post craniotomy 08/14/2016, confirming a metastatic deposit which was estrogen and progesterone receptor negative, HER-2 amplified with a signals ratio of 7.16, number per cell 13.25  (d) CA 27-29 is not informative  (4) received docetaxel every 3 weeks 6 together with trastuzumab and pertuzumab, last docetaxel dose 02/11/2016  (5) adjuvant radiation7/03/2016 to 06/26/2016 at [facility]: 1. The Left chest wall was treated to 23.4 Gy in 13 fractions at 1.8 Gy per fraction. 2. The Left chest wall was boosted to 10 Gy in 5 fractions at 2 Gy per fraction. 3. The Left Sclav/PAB was treated to 23.4 Gy in 13 fractions at 1.8 Gy per fraction.  [Note: Including the patient's treatment in [place] (received 15 fractions per Dr. Maryan Rued near [place], [place]), the patient received 50.4 Gy to the left chest wall and supraclavicular region. ]  (6) started trastuzumab and pertuzumab October 2016, continuing every 3 weeks,  (a) echocardiogram 02/26/2016 showed a well preserved ejection fraction  (b) echocardiogram 07/01/2016 shows an ejection fraction in the 60-65%   (c) pertuzumab discontinued 10/2016 with uncontrolled diarrhea  (d) echocardiogram 11/11/2016 showed an ejection fraction in the 60-65%  (e) echocardiogram 03/03/2017 shows an ejection fraction of 60-65%  (f) echocardiogram on 05/19/2017 shows an ejection fraction of 55-60%  (g) echocardiogram 09/24/2017 shows the ejection fraction in the 60-65%  (h) echocardiogram  02/14/2018 shows an ejection fraction in the 60-65%  (I) echocardiogram  06/30/2018 shows an ejection fraction in the 55-60%  (m) echocardiogram on 12/08/2018 shows an ejection fraction in the 60-65% range      (7) started denosumab/Xgeva October 2017 given every 4 weeks, transitioned to every 8 weeks beginning 10/11/18 (every 6 weeks while giving TDM1 every 3 weeks)  (8) started anastrozole October 2017   (a) bone scan 11/10/2016 shows no active disease  (b) chest CT scan 11/10/2016 stable, with no evidence of active disease  (c) chest CT and bone scan 07/02/2017 show no evidence of active disease  (d) CT scan of the chest with contrast 11/10/2017 shows some left axillary edema, but no evidence of thrombosis or adenopathy in that area, 0.9 cm precarinal lymph and 0.7 cm right upper lobe nodule node; bone lesions stable  (e) CT of the chest 05/04/2018 shows a 1.4 cm right lower paratracheal node which is slightly increased and a new right prevascular mediastinal node measuring 0.7 cm.  Bone lesions are stable.  (f) chest CT on 07/01/2018 shows no definite findings of metastatic disease in the thorax. Previously noted borderline enlarged low right paratracheal lymph node is stable to slightly decreased in size   (e) chest CT on 12/30/2018 notes mild increase in right paratracheal adneopathy, recommended PET scan.  Pet scan on 01/19/2019 showed  a hypermetabolic and enlarged right paratracheal lymph node consistent with breast cancer recurrence.  (f)Trastuzumab discontinued due to February 2020 scans   (g) TDM-1 started on 01/31/2019 given every 21 days.  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  (a) brain irradiation10/19/17-11/08/17: Whole brain/ 35 Gy in 14 fractions   (b) repeat brain MRI obtained 12/10/2016 shows no active disease in the brain  (c) repeat brain MRI 03/02/2017 shows no evidence of residual or recurrent disease  (d) repeat brain MRI 07/29/2017 shows no evidence of residual or recurrent disease  (e) repeat brain MRI 12/07/2017 shows no evidence of disease recurrence.  There is progressive white matter change secondary to prior treatment.  (f) repeat brain MRI 04/07/2018 showed no evidence of disease\  (g) repeat brain MRI on 08/23/2018 shows no evidence of disease  (h) repeat brain MRI on 01/25/2019 shows no evidence of disease   PLAN: Harmony is doing well today.  She has no signs of clinical progression.  Her CBC shows mildly decreased WBC count, with normal ANC.  Her plt count is 41.  I reviewed her scans with her and that the adenopathy had resolved.  This is good news.  I reviewed the above with Dr. [name], particularly considering the patient platelet count.  He wants her to proceed with treatment with TMD1 unless her platelet count falls below 30, then we will hold treatment and discuss options.  Shemeca was recommended healthy diet and exercise.  She will continue with treatment and we will see her back with every other TDM1.  She was recommended to continue with the appropriate pandemic precautions. She knows to call for any questions that may arise between now and her next appointment.  We are happy to see her sooner if needed.  A total of (20) minutes of face-to-face time was spent with this patient with greater than 50% of that time in counseling and care-coordination.   Wilber Bihari, NP   08/30/19 10:01 AM Medical Oncology and Hematology [facility] [address] [place],  [address] Tel. [phone]    Fax. [phone]

## 2019-08-30 ENCOUNTER — Inpatient Hospital Stay: Payer: Medicaid Other

## 2019-08-30 ENCOUNTER — Encounter: Payer: Self-pay | Admitting: Oncology

## 2019-08-30 ENCOUNTER — Other Ambulatory Visit: Payer: Self-pay

## 2019-08-30 ENCOUNTER — Encounter: Payer: Self-pay | Admitting: Adult Health

## 2019-08-30 ENCOUNTER — Inpatient Hospital Stay (HOSPITAL_BASED_OUTPATIENT_CLINIC_OR_DEPARTMENT_OTHER): Payer: Medicaid Other | Admitting: Adult Health

## 2019-08-30 ENCOUNTER — Other Ambulatory Visit: Payer: Self-pay | Admitting: Medical

## 2019-08-30 VITALS — BP 120/83 | HR 90 | Temp 97.8°F | Resp 16 | Ht 65.0 in | Wt 150.2 lb

## 2019-08-30 DIAGNOSIS — C50312 Malignant neoplasm of lower-inner quadrant of left female breast: Secondary | ICD-10-CM

## 2019-08-30 DIAGNOSIS — B370 Candidal stomatitis: Secondary | ICD-10-CM

## 2019-08-30 DIAGNOSIS — C7951 Secondary malignant neoplasm of bone: Secondary | ICD-10-CM | POA: Diagnosis not present

## 2019-08-30 DIAGNOSIS — C50919 Malignant neoplasm of unspecified site of unspecified female breast: Secondary | ICD-10-CM

## 2019-08-30 DIAGNOSIS — Z95828 Presence of other vascular implants and grafts: Secondary | ICD-10-CM

## 2019-08-30 DIAGNOSIS — Z5112 Encounter for antineoplastic immunotherapy: Secondary | ICD-10-CM | POA: Diagnosis not present

## 2019-08-30 DIAGNOSIS — F411 Generalized anxiety disorder: Secondary | ICD-10-CM

## 2019-08-30 LAB — COMPREHENSIVE METABOLIC PANEL
ALT: 23 U/L (ref 0–44)
AST: 38 U/L (ref 15–41)
Albumin: 3.7 g/dL (ref 3.5–5.0)
Alkaline Phosphatase: 83 U/L (ref 38–126)
Anion gap: 8 (ref 5–15)
BUN: 7 mg/dL (ref 6–20)
CO2: 29 mmol/L (ref 22–32)
Calcium: 8.6 mg/dL — ABNORMAL LOW (ref 8.9–10.3)
Chloride: 103 mmol/L (ref 98–111)
Creatinine, Ser: 0.83 mg/dL (ref 0.44–1.00)
GFR calc Af Amer: >60 mL/min (ref >60)
GFR calc non Af Amer: >60 mL/min (ref >60)
Glucose, Bld: 93 mg/dL (ref 70–99)
Potassium: 3.5 mmol/L (ref 3.5–5.1)
Sodium: 140 mmol/L (ref 135–145)
Total Bilirubin: 0.4 mg/dL (ref 0.3–1.2)
Total Protein: 6.4 g/dL — ABNORMAL LOW (ref 6.5–8.1)

## 2019-08-30 LAB — CBC WITH DIFFERENTIAL/PLATELET
Abs Immature Granulocytes: 0.02 10*3/uL (ref 0.00–0.07)
Basophils Absolute: 0 10*3/uL (ref 0.0–0.1)
Basophils Relative: 1 %
Eosinophils Absolute: 0 10*3/uL (ref 0.0–0.5)
Eosinophils Relative: 0 %
HCT: 38.8 % (ref 36.0–46.0)
Hemoglobin: 12.4 g/dL (ref 12.0–15.0)
Immature Granulocytes: 1 %
Lymphocytes Relative: 24 %
Lymphs Abs: 0.7 10*3/uL (ref 0.7–4.0)
MCH: 28.5 pg (ref 26.0–34.0)
MCHC: 32 g/dL (ref 30.0–36.0)
MCV: 89.2 fL (ref 80.0–100.0)
Monocytes Absolute: 0.3 10*3/uL (ref 0.1–1.0)
Monocytes Relative: 9 %
Neutro Abs: 1.8 10*3/uL (ref 1.7–7.7)
Neutrophils Relative %: 65 %
Platelets: 41 10*3/uL — ABNORMAL LOW (ref 150–400)
RBC: 4.35 MIL/uL (ref 3.87–5.11)
RDW: 14.7 % (ref 11.5–15.5)
WBC: 2.7 10*3/uL — ABNORMAL LOW (ref 4.0–10.5)
nRBC: 0 % (ref 0.0–0.2)

## 2019-08-30 MED ORDER — SODIUM CHLORIDE 0.9 % IV SOLN
Freq: Once | INTRAVENOUS | Status: AC
  Administered 2019-08-30: 10:00:00 via INTRAVENOUS
  Filled 2019-08-30: qty 250

## 2019-08-30 MED ORDER — DIPHENHYDRAMINE HCL 25 MG PO CAPS
ORAL_CAPSULE | ORAL | Status: AC
  Filled 2019-08-30: qty 1

## 2019-08-30 MED ORDER — SODIUM CHLORIDE 0.9% FLUSH
10.0000 mL | INTRAVENOUS | Status: DC | PRN
  Administered 2019-08-30: 10 mL
  Filled 2019-08-30: qty 10

## 2019-08-30 MED ORDER — HEPARIN SOD (PORK) LOCK FLUSH 100 UNIT/ML IV SOLN
500.0000 [IU] | Freq: Once | INTRAVENOUS | Status: AC | PRN
  Administered 2019-08-30: 500 [IU]
  Filled 2019-08-30: qty 5

## 2019-08-30 MED ORDER — ACETAMINOPHEN 325 MG PO TABS: 650.0000 mg | ORAL_TABLET | Freq: Once | ORAL | Status: DC

## 2019-08-30 MED ORDER — DIPHENHYDRAMINE HCL 25 MG PO CAPS: 25.0000 mg | ORAL_CAPSULE | Freq: Once | ORAL | Status: DC

## 2019-08-30 MED ORDER — FLUCONAZOLE 100 MG PO TABS: 100.0000 mg | ORAL_TABLET | Freq: Every day | ORAL | 0 refills | Status: DC

## 2019-08-30 MED ORDER — ACETAMINOPHEN 325 MG PO TABS
ORAL_TABLET | ORAL | Status: AC
  Filled 2019-08-30: qty 2

## 2019-08-30 MED ORDER — SODIUM CHLORIDE 0.9 % IV SOLN
260.0000 mg | Freq: Once | INTRAVENOUS | Status: AC
  Administered 2019-08-30: 260 mg via INTRAVENOUS
  Filled 2019-08-30: qty 5

## 2019-08-30 NOTE — Patient Instructions (Signed)
[facility] Discharge Instructions for Patients Receiving Chemotherapy  Today you received the following chemotherapy agents Ado-trastuzumab (KADCYLA).  To help prevent nausea and vomiting after your treatment, we encourage you to take your nausea medication as prescribed.   If you develop nausea and vomiting that is not controlled by your nausea medication, call the clinic.   BELOW ARE SYMPTOMS THAT SHOULD BE REPORTED IMMEDIATELY:  *FEVER GREATER THAN 100.5 F  *CHILLS WITH OR WITHOUT FEVER  NAUSEA AND VOMITING THAT IS NOT CONTROLLED WITH YOUR NAUSEA MEDICATION  *UNUSUAL SHORTNESS OF BREATH  *UNUSUAL BRUISING OR BLEEDING  TENDERNESS IN MOUTH AND THROAT WITH OR WITHOUT PRESENCE OF ULCERS  *URINARY PROBLEMS  *BOWEL PROBLEMS  UNUSUAL RASH Items with * indicate a potential emergency and should be followed up as soon as possible.  Feel free to call the clinic should you have any questions or concerns. The clinic phone number is [phone].  Please show the CHEMO ALERT CARD at check-in to the Emergency Department and triage nurse.  Coronavirus (COVID-19) Are you at risk?  Are you at risk for the Coronavirus (COVID-19)?  To be considered HIGH RISK for Coronavirus (COVID-19), you have to meet the following criteria:  . Traveled to [place], [place], [place], [place] or [place]; or in the [place] to [place], [place], [place], or [place]; and have fever, cough, and shortness of breath within the last 2 weeks of travel OR . Been in close contact with a person diagnosed with COVID-19 within the last 2 weeks and have fever, cough, and shortness of breath . IF YOU DO NOT MEET THESE CRITERIA, YOU ARE CONSIDERED LOW RISK FOR COVID-19.  What to do if you are HIGH RISK for COVID-19?  . If you are having a medical emergency, call 911. . Seek medical care right away. Before you go to a doctor's office, urgent care or emergency department, call ahead and tell  them about your recent travel, contact with someone diagnosed with COVID-19, and your symptoms. You should receive instructions from your physician's office regarding next steps of care.  . When you arrive at healthcare provider, tell the healthcare staff immediately you have returned from visiting [place], [place], [place], [place] or [place]; or traveled in the [place] to [place], [place], [place], or [place]; in the last two weeks or you have been in close contact with a person diagnosed with COVID-19 in the last 2 weeks.   . Tell the health care staff about your symptoms: fever, cough and shortness of breath. . After you have been seen by a medical provider, you will be either: o Tested for (COVID-19) and discharged home on quarantine except to seek medical care if symptoms worsen, and asked to  - Stay home and avoid contact with others until you get your results (4-5 days)  - Avoid travel on public transportation if possible (such as bus, train, or airplane) or o Sent to the Emergency Department by EMS for evaluation, COVID-19 testing, and possible admission depending on your condition and test results.  What to do if you are LOW RISK for COVID-19?  Reduce your risk of any infection by using the same precautions used for avoiding the common cold or flu:  . Wash your hands often with soap and warm water for at least 20 seconds.  If soap and water are not readily available, use an alcohol-based hand sanitizer with at least 60% alcohol.  . If coughing or   sneezing, cover your mouth and nose by coughing or sneezing into the elbow areas of your shirt or coat, into a tissue or into your sleeve (not your hands). . Avoid shaking hands with others and consider head nods or verbal greetings only. . Avoid touching your eyes, nose, or mouth with unwashed hands.  . Avoid close contact with people who are sick. . Avoid places or events with large numbers of people in one location, like concerts or  sporting events. . Carefully consider travel plans you have or are making. . If you are planning any travel outside or inside the [place], visit the CDC's Travelers' Health webpage for the latest health notices. . If you have some symptoms but not all symptoms, continue to monitor at home and seek medical attention if your symptoms worsen. . If you are having a medical emergency, call 911.   [place] / e-Visit: [email or web address]         [facility]: [facility]: [phone]                   [facility]: [phone]

## 2019-08-31 ENCOUNTER — Ambulatory Visit: Payer: Medicaid Other | Admitting: Physical Therapy

## 2019-08-31 LAB — CANCER ANTIGEN 27.29: CA 27.29: 14.2 U/mL (ref 0.0–38.6)

## 2019-09-01 ENCOUNTER — Telehealth: Payer: Self-pay | Admitting: Oncology

## 2019-09-01 ENCOUNTER — Telehealth: Payer: Self-pay | Admitting: Adult Health

## 2019-09-01 NOTE — Telephone Encounter (Signed)
Returned patient's phone call regarding rescheduling an appointment, informed patient I will give a call back once I get further instructions from provider.

## 2019-09-01 NOTE — Telephone Encounter (Signed)
Scheduled per 10/02 scheduled message, patient Is notified.

## 2019-09-06 ENCOUNTER — Other Ambulatory Visit: Payer: Self-pay | Admitting: Family Medicine

## 2019-09-06 DIAGNOSIS — K219 Gastro-esophageal reflux disease without esophagitis: Secondary | ICD-10-CM

## 2019-09-07 ENCOUNTER — Other Ambulatory Visit: Payer: Self-pay

## 2019-09-07 ENCOUNTER — Ambulatory Visit: Payer: Medicaid Other | Attending: Adult Health | Admitting: Physical Therapy

## 2019-09-07 DIAGNOSIS — M6281 Muscle weakness (generalized): Secondary | ICD-10-CM | POA: Diagnosis present

## 2019-09-07 DIAGNOSIS — R2681 Unsteadiness on feet: Secondary | ICD-10-CM | POA: Insufficient documentation

## 2019-09-07 DIAGNOSIS — R2689 Other abnormalities of gait and mobility: Secondary | ICD-10-CM | POA: Diagnosis present

## 2019-09-07 NOTE — Patient Instructions (Signed)
OTAGO Program initiated through heel/toe raises in standing

## 2019-09-07 NOTE — Therapy (Signed)
[facility] [address] [place], [place], [address] Phone: [phone]   Fax:  [phone]  Physical Therapy Treatment  Patient Details  Name: [name] MRN: [id number] Date of Birth: 02/01/1961 Referring Provider (PT): Wilber Bihari, NP   Encounter Date: 09/07/2019  PT End of Session - 09/07/19 2056    Visit Number  2   New episode of care   Number of Visits  12    Date for PT Re-Evaluation  11/20/19    Authorization Type  Medicaid (per email from Antony Salmon and Trudee Kuster 08/22/2019, >1 medicaid eval is allowed in a year)    Authorization Time Period  08/31/2019-09/20/2019    Authorization - Visit Number  1    Authorization - Number of Visits  3    PT Start Time  [id number]    PT Stop Time  0934    PT Time Calculation (min)  47 min    Activity Tolerance  Patient tolerated treatment well;Patient limited by fatigue   Fatigue during Berg, reaches out multiple times to therapist for balance   Behavior During Therapy  Impulsive   decreased safety awareness      Past Medical History:  Diagnosis Date  . Alcohol abuse   . Anemia    during chemo  . Anxiety    At age 34  . Arthritis Dx 2010  . Bipolar disorder ([place])   . Bronchitis   . Cancer ([place])    breast mets to brain  . Chronic pain   . Complication of anesthesia   . Depression   . Family history of adverse reaction to anesthesia    MOther had PONV  . Fibromyalgia Dx 2005  . GERD (gastroesophageal reflux disease)   . Headache    hx  migraines  . Lymphedema of left arm   . Opiate dependence ([place])   . PONV (postoperative nausea and vomiting)   . Port-A-Cath in place   . PTSD (post-traumatic stress disorder)     Past Surgical History:  Procedure Laterality Date  . APPLICATION OF CRANIAL NAVIGATION N/A 08/14/2016   Procedure: APPLICATION OF CRANIAL NAVIGATION;  Surgeon: Erline Levine, MD;  Location: [facility];  Service: Neurosurgery;  Laterality:  N/A;  . BREAST RECONSTRUCTION Left    with silicone implant  . COLONOSCOPY W/ POLYPECTOMY    . CRANIOTOMY N/A 08/14/2016   Procedure: CRANIOTOMY TUMOR EXCISION WITH Lucky Rathke;  Surgeon: Erline Levine, MD;  Location: [facility];  Service: Neurosurgery;  Laterality: N/A;  . FIBULA FRACTURE SURGERY Left   . MASTECTOMY Left   . RADIOLOGY WITH ANESTHESIA N/A 07/23/2016   Procedure: MRI OF BRAIN WITH AND WITHOUT;  Surgeon: Medication Radiologist, MD;  Location: [facility];  Service: Radiology;  Laterality: N/A;  . RADIOLOGY WITH ANESTHESIA N/A 09/08/2016   Procedure: MRI OF BRAIN WITH AND WITHOUT CONTRAST;  Surgeon: Medication Radiologist, MD;  Location: [facility];  Service: Radiology;  Laterality: N/A;  . RADIOLOGY WITH ANESTHESIA N/A 12/10/2016   Procedure: MRI OF BRAIN WITH AND WITHOUT;  Surgeon: Medication Radiologist, MD;  Location: [facility];  Service: Radiology;  Laterality: N/A;  . RADIOLOGY WITH ANESTHESIA N/A 03/02/2017   Procedure: MRI of BRAIN W and W/OUT CONTRAST;  Surgeon: Medication Radiologist, MD;  Location: [facility];  Service: Radiology;  Laterality: N/A;  . RADIOLOGY WITH ANESTHESIA N/A 07/29/2017   Procedure: RADIOLOGY WITH ANESTHESIA MRI OF BRAIN WITH AND WITHOUT CONTRAST;  Surgeon: Radiologist, Medication, MD;  Location: [facility];  Service: Radiology;  Laterality: N/A;  . RADIOLOGY WITH ANESTHESIA N/A 12/07/2017   Procedure: MRI WITH ANESTHESIA OF BRAIN WITH AND WITHOUT CONTRAST;  Surgeon: Radiologist, Medication, MD;  Location: [facility];  Service: Radiology;  Laterality: N/A;  . RADIOLOGY WITH ANESTHESIA N/A 04/07/2018   Procedure: MRI OF BRAIN WITH AND WITHOUT CONTRAST;  Surgeon: Radiologist, Medication, MD;  Location: [facility];  Service: Radiology;  Laterality: N/A;  . RADIOLOGY WITH ANESTHESIA N/A 08/23/2018   Procedure: MRI WITH ANESTHESIA OF THE BRAIN WITH AND WITHOUT;  Surgeon: Radiologist, Medication, MD;  Location: [facility];  Service: Radiology;  Laterality: N/A;  . RADIOLOGY WITH ANESTHESIA N/A 01/24/2019    Procedure: MRI OF BRAIN WITH AND WITHOUT CONTRAST;  Surgeon: Radiologist, Medication, MD;  Location: [facility];  Service: Radiology;  Laterality: N/A;  . RADIOLOGY WITH ANESTHESIA N/A 07/06/2019   Procedure: MRI WITH ANESTHESIA OF BRAIN WITH AND WITHOUT CONTRAST;  Surgeon: Radiologist, Medication, MD;  Location: [facility];  Service: Radiology;  Laterality: N/A;  . right power port placement Right     There were no vitals filed for this visit.  Subjective Assessment - 09/07/19 0847    Subjective  Had a fall the other day, tried to sit on the curb and then I just fell over forward.  No blacking out or changes since then, but did not get checked out by a doctor.  Do I need to go to assisted living for this (rehab)?    Pertinent History  Per [employer] lymphedema eval 07/2019:  Treated here in March of last year currently with a reid sleeve, day sleeve and compression pump. Lt mastectomy and ALND with reconstruction 07/08/15 for T4N2 stage IIIB IDC. Metastatic disease in the brain s/p craniotomy 08/14/16. Chemo completed in 2017 Trastuzumab and Pertuzumab, docetaxel. Radiation completed 2017. Stable bone lesions to the Rt scapula, Lt iliac crest, L4, Tspine. Also probable in the liver and lungs. Pt is also bipolar and a recovered drug and alchohol user.  Current treatment TDM-1, denosumab and anastrozole.    Patient Stated Goals  To get more stability back in legs    Currently in Pain?  No/denies                       [facility] Adult PT Treatment/Exercise - 09/07/19 0001      Transfers   Transfers  Sit to Stand;Stand to Sit    Sit to Stand  5: Supervision;With upper extremity assist;From bed;From chair/3-in-1    Sit to Stand Details  Verbal cues for sequencing;Verbal cues for technique   For hand placement   Stand to Sit  5: Supervision;With upper extremity assist;To chair/3-in-1    Stand to Sit Details (indicate cue type and reason)  Verbal cues for sequencing;Verbal cues for technique;Verbal  cues for precautions/safety   Cues to fully turn around and for hand placement to sit   Comments  With use of rollator walker, pt requires cues for safety, and for correct hand placement, as pt tends to hold to rollator for sit<>stand.      Ambulation/Gait   Ambulation/Gait  Yes    Ambulation/Gait Assistance  4: Min guard;5: Supervision    Ambulation/Gait Assistance Details  --       Ambulation Distance (Feet)  115 Feet   x 2 rollator; 100 ft with RW   Assistive device  Rollator    Gait Pattern  Step-through pattern;Decreased arm swing - right;Decreased arm swing - left;Decreased step  length - right;Decreased step length - left;Shuffle;Decreased dorsiflexion - right;Decreased dorsiflexion - left;Poor foot clearance - left;Poor foot clearance - right;Decreased trunk rotation    Ambulation Surface  Level;Indoor    Gait Comments  Trial of rollator today, with pt needing cues for staying close to rollator and for slowed pace, for improved foot clearance.  Pt is able to slow pace and have improved foot clearance with cues.  Trial of RW (pt says she has at home but does not use); With trial of RW, pt pushes walker too far in front and has shuffling gait pattern, with LOB x 2 with min guard assist of therapist.      Standardized Balance Assessment   Standardized Balance Assessment  Berg Balance Test      Berg Balance Test   Sit to Stand  Able to stand  independently using hands    Standing Unsupported  Able to stand 2 minutes with supervision    Sitting with Back Unsupported but Feet Supported on Floor or Stool  Able to sit safely and securely 2 minutes    Stand to Sit  Controls descent by using hands    Transfers  Able to transfer safely, minor use of hands    Standing Unsupported with Eyes Closed  Able to stand 3 seconds    Standing Ubsupported with Feet Together  Able to place feet together independently and stand for 1 minute with supervision    From Standing, Reach Forward with Outstretched  Arm  Can reach confidently >25 cm (10")    From Standing Position, Pick up Object from Floor  Able to pick up shoe, needs supervision    From Standing Position, Turn to Look Behind Over each Shoulder  Turn sideways only but maintains balance    Turn 360 Degrees  Needs close supervision or verbal cueing   6.37 sec   Standing Unsupported, Alternately Place Feet on Step/Stool  Needs assistance to keep from falling or unable to try    Standing Unsupported, One Foot in Front  Able to take small step independently and hold 30 seconds    Standing on One Leg  Unable to try or needs assist to prevent fall    Total Score  34          Balance Exercises - 09/07/19 0916      OTAGO PROGRAM   Head Movements  Sitting;Standing;5 reps    Neck Movements  Sitting;Standing;5 reps    Back Extension  Standing;5 reps    Trunk Movements  Standing;5 reps   Holding onto rollator   Ankle Movements  Sitting;10 reps    Knee Extensor  10 reps   Also seated marching 10 reps   Knee Flexor  5 reps   2 sets, with cues for technique   Hip ABductor  10 reps    Ankle Plantorflexors  --   10 reps, UE support   Ankle Dorsiflexors  --   10 reps, UE support       PT Education - 09/07/19 2054    Education Details  HEP initiated; OTAGO program; safety with gait and turns, fall risk per DGI and Berg measures, benefits of rollator walker (due to decr. balance and fatigue)    Person(s) Educated  Patient    Methods  Explanation;Demonstration;Verbal cues;Handout    Comprehension  Verbalized understanding;Returned demonstration;Verbal cues required       PT Short Term Goals - 08/22/19 2103      PT SHORT TERM GOAL #1  Title  Pt will be independent with HEP to address balance, strength, and gait.  TARGET 3 weeks (09/22/2019)    Baseline  No current HEP for balance    Time  3    Period  Weeks    Status  New    Target Date  09/22/19      PT SHORT TERM GOAL #2   Title  Pt will improve TUG score to less than or  equal to 13.5 seconds for decreased fall risk.    Baseline  TUG score 17.19 seconds (>13.5 seconds indicates increased fall risk)    Time  3    Period  Weeks    Status  New    Target Date  09/22/19      PT SHORT TERM GOAL #3   Title  Pt will verbalize fall prevention in home envrionment.    Baseline  At fall risk per TUG, DGI scores    Time  3    Period  Weeks    Status  New    Target Date  09/22/19        PT Long Term Goals - 08/22/19 2109      PT LONG TERM GOAL #1   Title  Pt will be independent with progression of balance exercise program for decreased fall risk.  TARGET 10/20/2019    Baseline  No formal balance HEP    Time  7    Period  Weeks    Status  New    Target Date  10/20/19      PT LONG TERM GOAL #2   Title  Pt will improve DGI score to at least 16/24 for decreased fall risk.    Baseline  DGI score 11/24 (Scores <19/24 indicate increased fall risk)    Time  7    Status  New    Target Date  10/20/19      PT LONG TERM GOAL #3   Title  Pt will improve gait velocity to at least 2.3 ft/sec for improved gait efficiency and safety.    Baseline  Gait velocity 1.95 ft/sec (1.31-2.62 f/tsec indicates limited community ambulator)    Time  7    Period  Weeks    Status  New    Target Date  10/20/19      PT LONG TERM GOAL #4   Title  Pt will negotiate 1 curb step to simulate step up into home, no rail, independentlyf or improved safety with curbs/home entry.    Baseline  Pt reports one near fall with curb step; unable to perform stepping over obstacle without stopping, SLS <1 second each leg    Time  7    Period  Weeks    Status  New    Target Date  10/20/19            Plan - 09/07/19 2058    Clinical Impression Statement  Pt returns to PT today following PT eval, with HEP initiated for strength.  Initiated OTAGO exercise program in order to address strength, balance, walking.  Assessed Merrilee Jansky this visit due to pt's recent fall, with Berg score 34/56; pt is at  fall risk and would benefit from use of walker for improved safety with gait.  Trialed rollator and RW this visit, with pt demonstrating improved safety with gait on rollator versus RW, but she will need continued repetition and practice in rollator use.  PT to try to request order for rollator from MD.  Pt will continue to benefit from skilled PT to address strength, balance, mobility.    Personal Factors and Comorbidities  Comorbidity 3+    Comorbidities  See PMH (subjective); metastatic breast cancer, hx of bipolar and anxiety    Examination-Activity Limitations  Stairs   Curbs   Examination-Participation Restrictions  --   Work   Stability/Clinical Decision Making  Stable/Uncomplicated    Rehab Potential  Good    PT Frequency  Other (comment)   1x/wk for 3 weeks, then 2x/wk for 4 weeks   PT Duration  --   7 weeks total POC   PT Treatment/Interventions  ADLs/Self Care Home Management;Neuromuscular re-education;Gait training;Stair training;Patient/family education;Therapeutic activities;Functional mobility training;Therapeutic exercise;Balance training;DME Instruction    PT Next Visit Plan  Review HEP and add to OTAGO as appropriate; gait training (rollator versus RW) for improved foot clearance; curb and step negotiation, fall prevention education    Recommended Other Services  PT to request order for rollator from NP    Consulted and Agree with Plan of Care  Patient       Patient will benefit from skilled therapeutic intervention in order to improve the following deficits and impairments:  Abnormal gait, Difficulty walking, Decreased mobility, Decreased strength, Decreased balance  Visit Diagnosis: Unsteadiness on feet  Muscle weakness (generalized)  Other abnormalities of gait and mobility     Problem List Patient Active Problem List   Diagnosis Date Noted  . Thrombocytopenia ([place]) 08/09/2019  . Port-A-Cath in place 06/20/2019  . 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 S/P mastectomy, left 12/09/2018  . S/P breast reconstruction, left 12/09/2018  . S/P radiation therapy 12/09/2018  . GERD (gastroesophageal reflux disease) 12/10/2017  . Edema 11/09/2017  . Sensorineural hearing loss (SNHL) of both ears 11/05/2017  . Vitamin D deficiency 01/18/2017  . Bipolar I disorder, most recent episode depressed ([place]) 12/08/2016  . Adjustment disorder with anxiety 12/06/2016  . Metastatic breast cancer ([place])   . History of cancer metastatic to brain 07/27/2016  . Iron deficiency anemia 06/26/2016  . Bone metastases ([place]) 06/03/2016  . Primary cancer of lower-inner quadrant of left female breast ([place]) 06/01/2016  . Current smoker 03/28/2015  . Seasonal allergies 03/28/2015  . Anxiety state 02/28/2015  . Fibromyalgia 02/28/2015  . Family history of diabetes mellitus 02/28/2015  . H/O alcohol abuse     Hanzel Pizzo W. 09/07/2019, 9:08 PM  Frazier Butt., PT   [facility] [address] [place], [place], [address] Phone: [phone]   Fax:  [phone]  Name: [name] MRN: [id number] Date of Birth: 1961-08-26

## 2019-09-14 ENCOUNTER — Other Ambulatory Visit: Payer: Self-pay

## 2019-09-14 ENCOUNTER — Encounter: Payer: Self-pay | Admitting: Physical Therapy

## 2019-09-14 ENCOUNTER — Ambulatory Visit: Payer: Medicaid Other | Admitting: Physical Therapy

## 2019-09-14 DIAGNOSIS — R2681 Unsteadiness on feet: Secondary | ICD-10-CM

## 2019-09-14 DIAGNOSIS — M6281 Muscle weakness (generalized): Secondary | ICD-10-CM

## 2019-09-14 NOTE — Patient Instructions (Addendum)

## 2019-09-15 ENCOUNTER — Telehealth: Payer: Self-pay | Admitting: Physical Therapy

## 2019-09-15 NOTE — Therapy (Signed)
[facility] [address] [place], [place], [address] Phone: [phone]   Fax:  [phone]  Physical Therapy Treatment  Patient Details  Name: [name] MRN: [id number] Date of Birth: [birth year] Referring Provider (PT): Wilber Bihari, NP   Encounter Date: 09/14/2019  PT End of Session - 09/15/19 1411    Visit Number  3   New episode of care   Number of Visits  12    Date for PT Re-Evaluation  11/20/19    Authorization Type  Medicaid (per email from Antony Salmon and Trudee Kuster 08/22/2019, >1 medicaid eval is allowed in a year)    Authorization Time Period  08/31/2019-09/20/2019    Authorization - Visit Number  2    Authorization - Number of Visits  3    PT Start Time  [phone]    PT Stop Time  1015    PT Time Calculation (min)  41 min    Activity Tolerance  Patient tolerated treatment well;Patient limited by fatigue    Behavior During Therapy  Impulsive   decreased safety awareness      Past Medical History:  Diagnosis Date  . Alcohol abuse   . Anemia    during chemo  . Anxiety    At age 45  . Arthritis Dx 2010  . Bipolar disorder ([place])   . Bronchitis   . Cancer ([place])    breast mets to brain  . Chronic pain   . Complication of anesthesia   . Depression   . Family history of adverse reaction to anesthesia    MOther had PONV  . Fibromyalgia Dx 2005  . GERD (gastroesophageal reflux disease)   . Headache    hx  migraines  . Lymphedema of left arm   . Opiate dependence ([place])   . PONV (postoperative nausea and vomiting)   . Port-A-Cath in place   . PTSD (post-traumatic stress disorder)     Past Surgical History:  Procedure Laterality Date  . APPLICATION OF CRANIAL NAVIGATION N/A 08/14/2016   Procedure: APPLICATION OF CRANIAL NAVIGATION;  Surgeon: Erline Levine, MD;  Location: [facility];  Service: Neurosurgery;  Laterality: N/A;  . BREAST RECONSTRUCTION Left    with silicone implant  .  COLONOSCOPY W/ POLYPECTOMY    . CRANIOTOMY N/A 08/14/2016   Procedure: CRANIOTOMY TUMOR EXCISION WITH Lucky Rathke;  Surgeon: Erline Levine, MD;  Location: [facility];  Service: Neurosurgery;  Laterality: N/A;  . FIBULA FRACTURE SURGERY Left   . MASTECTOMY Left   . RADIOLOGY WITH ANESTHESIA N/A 07/23/2016   Procedure: MRI OF BRAIN WITH AND WITHOUT;  Surgeon: Medication Radiologist, MD;  Location: [facility];  Service: Radiology;  Laterality: N/A;  . RADIOLOGY WITH ANESTHESIA N/A 09/08/2016   Procedure: MRI OF BRAIN WITH AND WITHOUT CONTRAST;  Surgeon: Medication Radiologist, MD;  Location: [facility];  Service: Radiology;  Laterality: N/A;  . RADIOLOGY WITH ANESTHESIA N/A 12/10/2016   Procedure: MRI OF BRAIN WITH AND WITHOUT;  Surgeon: Medication Radiologist, MD;  Location: [facility];  Service: Radiology;  Laterality: N/A;  . RADIOLOGY WITH ANESTHESIA N/A 03/02/2017   Procedure: MRI of BRAIN W and W/OUT CONTRAST;  Surgeon: Medication Radiologist, MD;  Location: [facility];  Service: Radiology;  Laterality: N/A;  . RADIOLOGY WITH ANESTHESIA N/A 07/29/2017   Procedure: RADIOLOGY WITH ANESTHESIA MRI OF BRAIN WITH AND WITHOUT CONTRAST;  Surgeon: Radiologist, Medication, MD;  Location: [facility];  Service: Radiology;  Laterality: N/A;  . RADIOLOGY WITH ANESTHESIA  N/A 12/07/2017   Procedure: MRI WITH ANESTHESIA OF BRAIN WITH AND WITHOUT CONTRAST;  Surgeon: Radiologist, Medication, MD;  Location: [facility];  Service: Radiology;  Laterality: N/A;  . RADIOLOGY WITH ANESTHESIA N/A 04/07/2018   Procedure: MRI OF BRAIN WITH AND WITHOUT CONTRAST;  Surgeon: Radiologist, Medication, MD;  Location: [facility];  Service: Radiology;  Laterality: N/A;  . RADIOLOGY WITH ANESTHESIA N/A 08/23/2018   Procedure: MRI WITH ANESTHESIA OF THE BRAIN WITH AND WITHOUT;  Surgeon: Radiologist, Medication, MD;  Location: [facility];  Service: Radiology;  Laterality: N/A;  . RADIOLOGY WITH ANESTHESIA N/A 01/24/2019   Procedure: MRI OF BRAIN WITH AND WITHOUT CONTRAST;  Surgeon:  Radiologist, Medication, MD;  Location: [facility];  Service: Radiology;  Laterality: N/A;  . RADIOLOGY WITH ANESTHESIA N/A 07/06/2019   Procedure: MRI WITH ANESTHESIA OF BRAIN WITH AND WITHOUT CONTRAST;  Surgeon: Radiologist, Medication, MD;  Location: [facility];  Service: Radiology;  Laterality: N/A;  . right power port placement Right     There were no vitals filed for this visit.  Subjective Assessment - 09/14/19 0937    Subjective  Went to the gym the other day and walked, used bike for several minutes.  Used machines.  Did my exercises.  No falls.    Pertinent History  Per [employer] lymphedema eval 07/2019:  Treated here in March of last year currently with a reid sleeve, day sleeve and compression pump. Lt mastectomy and ALND with reconstruction 07/08/15 for T4N2 stage IIIB IDC. Metastatic disease in the brain s/p craniotomy 08/14/16. Chemo completed in 2017 Trastuzumab and Pertuzumab, docetaxel. Radiation completed 2017. Stable bone lesions to the Rt scapula, Lt iliac crest, L4, Tspine. Also probable in the liver and lungs. Pt is also bipolar and a recovered drug and alchohol user.  Current treatment TDM-1, denosumab and anastrozole.    Patient Stated Goals  To get more stability back in legs    Currently in Pain?  Yes    Pain Score  1     Pain Location  Back    Pain Orientation  Right;Lower    Pain Descriptors / Indicators  Aching    Pain Type  Chronic pain    Pain Onset  More than a month ago    Pain Frequency  Intermittent    Aggravating Factors   Bending, certain movements    Pain Relieving Factors  Medication                       [facility] Adult PT Treatment/Exercise - 09/15/19 0001      Self-Care   Self-Care  Other Self-Care Comments    Other Self-Care Comments   Provided fall prevention education to patient; will follow-up again with MD regarding rollator walker.          Balance Exercises - 09/14/19 0941      OTAGO PROGRAM   Head Movements  Sitting;5 reps     Neck Movements  Sitting;5 reps    Back Extension  Standing;5 reps    Trunk Movements  Standing;5 reps    Ankle Movements  Sitting;10 reps    Knee Extensor  10 reps   Also seated marching x 10 reps   Knee Flexor  5 reps    Hip ABductor  10 reps    Ankle Plantorflexors  20 reps, support    Ankle Dorsiflexors  20 reps, support    Knee Bends  10 reps, support    Backwards Walking  Support   4 reps forward and backward   Sideways Walking  Assistive device   Holding to counter   Sit to Stand  5 reps, bilateral support   2 sets       PT Education - 09/15/19 1410    Education Details  Additions to HEP; fall prevention    Person(s) Educated  Patient    Methods  Explanation;Demonstration;Verbal cues;Handout    Comprehension  Verbalized understanding;Returned demonstration;Verbal cues required       PT Short Term Goals - 08/22/19 2103      PT SHORT TERM GOAL #1   Title  Pt will be independent with HEP to address balance, strength, and gait.  TARGET 3 weeks (09/22/2019)    Baseline  No current HEP for balance    Time  3    Period  Weeks    Status  New    Target Date  09/22/19      PT SHORT TERM GOAL #2   Title  Pt will improve TUG score to less than or equal to 13.5 seconds for decreased fall risk.    Baseline  TUG score 17.19 seconds (>13.5 seconds indicates increased fall risk)    Time  3    Period  Weeks    Status  New    Target Date  09/22/19      PT SHORT TERM GOAL #3   Title  Pt will verbalize fall prevention in home envrionment.    Baseline  At fall risk per TUG, DGI scores    Time  3    Period  Weeks    Status  New    Target Date  09/22/19        PT Long Term Goals - 08/22/19 2109      PT LONG TERM GOAL #1   Title  Pt will be independent with progression of balance exercise program for decreased fall risk.  TARGET 10/20/2019    Baseline  No formal balance HEP    Time  7    Period  Weeks    Status  New    Target Date  10/20/19      PT LONG TERM GOAL #2    Title  Pt will improve DGI score to at least 16/24 for decreased fall risk.    Baseline  DGI score 11/24 (Scores <19/24 indicate increased fall risk)    Time  7    Status  New    Target Date  10/20/19      PT LONG TERM GOAL #3   Title  Pt will improve gait velocity to at least 2.3 ft/sec for improved gait efficiency and safety.    Baseline  Gait velocity 1.95 ft/sec (1.31-2.62 f/tsec indicates limited community ambulator)    Time  7    Period  Weeks    Status  New    Target Date  10/20/19      PT LONG TERM GOAL #4   Title  Pt will negotiate 1 curb step to simulate step up into home, no rail, independentlyf or improved safety with curbs/home entry.    Baseline  Pt reports one near fall with curb step; unable to perform stepping over obstacle without stopping, SLS <1 second each leg    Time  7    Period  Weeks    Status  New    Target Date  10/20/19            Plan - 09/15/19 1412  Clinical Impression Statement  REviewed and progressed HEP this visit.  Provided fall prevention education to patient.  Pt continues to need occasional rest breaks due to fatigue during session, and will continue to benefit from skilled PT towards goals.    Personal Factors and Comorbidities  Comorbidity 3+    Comorbidities  See PMH (subjective); metastatic breast cancer, hx of bipolar and anxiety    Examination-Activity Limitations  Stairs   Curbs   Examination-Participation Restrictions  --   Work   Stability/Clinical Decision Making  Stable/Uncomplicated    Rehab Potential  Good    PT Frequency  Other (comment)   1x/wk for 3 weeks, then 2x/wk for 4 weeks   PT Duration  --   7 weeks total POC   PT Treatment/Interventions  ADLs/Self Care Home Management;Neuromuscular re-education;Gait training;Stair training;Patient/family education;Therapeutic activities;Functional mobility training;Therapeutic exercise;Balance training;DME Instruction    PT Next Visit Plan  Review additions to HEP and add  to OTAGO as appropriate; gait training (rollator versus RW) for improved foot clearance; curb and step negotiation; check STGs and resubmit to Medicaid    Recommended Other Services  Send telephone order to NP 09/15/2019    Consulted and Agree with Plan of Care  Patient       Patient will benefit from skilled therapeutic intervention in order to improve the following deficits and impairments:  Abnormal gait, Difficulty walking, Decreased mobility, Decreased strength, Decreased balance  Visit Diagnosis: Muscle weakness (generalized)  Unsteadiness on feet     Problem List Patient Active Problem List   Diagnosis Date Noted  . Thrombocytopenia ([place]) 08/09/2019  . Port-A-Cath in place 06/20/2019  . 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 S/P mastectomy, left 12/09/2018  . S/P breast reconstruction, left 12/09/2018  . S/P radiation therapy 12/09/2018  . GERD (gastroesophageal reflux disease) 12/10/2017  . Edema 11/09/2017  . Sensorineural hearing loss (SNHL) of both ears 11/05/2017  . Vitamin D deficiency 01/18/2017  . Bipolar I disorder, most recent episode depressed ([place]) 12/08/2016  . Adjustment disorder with anxiety 12/06/2016  . Metastatic breast cancer ([place])   . History of cancer metastatic to brain 07/27/2016  . Iron deficiency anemia 06/26/2016  . Bone metastases ([place]) 06/03/2016  . Primary cancer of lower-inner quadrant of left female breast ([place]) 06/01/2016  . Current smoker 03/28/2015  . Seasonal allergies 03/28/2015  . Anxiety state 02/28/2015  . Fibromyalgia 02/28/2015  . Family history of diabetes mellitus 02/28/2015  . H/O alcohol abuse     Maryfer Tauzin W. 09/15/2019, 2:18 PM  Frazier Butt., PT   [facility] [address] [place], [place], [address] Phone: [phone]   Fax:  [phone]  Name: [name] MRN: [id number] Date of Birth: [birth year]

## 2019-09-15 NOTE — Telephone Encounter (Signed)
Hello, Fleta Moeller has been seen for PT to address balance and gait.  She is at a high fall risk per Merrilee Jansky and DGI scores.  She would benefit from a rollator walker for improved stability with gait due to balance, fatigue with gait.  She has demonstrated improved ease of gait and safety with gait using rollator versus the rolling walker.   If you agree, could you please place order for rollator walker in Epic?   Thank you.  Mady Haagensen, PT 09/15/19 2:15 PM Phone: [phone] Fax: [phone]

## 2019-09-15 NOTE — Telephone Encounter (Signed)
Val, can you help with this?

## 2019-09-18 ENCOUNTER — Encounter: Payer: Self-pay | Admitting: Physical Therapy

## 2019-09-18 ENCOUNTER — Ambulatory Visit: Payer: Medicaid Other | Admitting: Physical Therapy

## 2019-09-18 ENCOUNTER — Other Ambulatory Visit: Payer: Self-pay

## 2019-09-18 DIAGNOSIS — M6281 Muscle weakness (generalized): Secondary | ICD-10-CM

## 2019-09-18 DIAGNOSIS — R2681 Unsteadiness on feet: Secondary | ICD-10-CM

## 2019-09-18 DIAGNOSIS — R2689 Other abnormalities of gait and mobility: Secondary | ICD-10-CM

## 2019-09-18 NOTE — Therapy (Signed)
[facility] [address] [place], [place], [address] Phone: [phone]   Fax:  [phone]  Physical Therapy Treatment  Patient Details  Name: [name] MRN: [id number] Date of Birth: 1961/10/02 Referring Provider (PT): Wilber Bihari, NP   Encounter Date: 09/18/2019  PT End of Session - 09/18/19 1518    Visit Number  4   New episode of care   Number of Visits  12    Date for PT Re-Evaluation  11/20/19    Authorization Type  Medicaid (per email from Antony Salmon and Trudee Kuster 08/22/2019, >1 medicaid eval is allowed in a year); 09/18/2019:  requesting Medicaid reauth    Authorization Time Period  08/31/2019-09/20/2019    Authorization - Visit Number  3    Authorization - Number of Visits  3    PT Start Time  0806    PT Stop Time  0846    PT Time Calculation (min)  40 min    Activity Tolerance  Patient tolerated treatment well;Patient limited by fatigue    Behavior During Therapy  Impulsive   decreased safety awareness      Past Medical History:  Diagnosis Date  . Alcohol abuse   . Anemia    during chemo  . Anxiety    At age 79  . Arthritis Dx 2010  . Bipolar disorder ([place])   . Bronchitis   . Cancer ([place])    breast mets to brain  . Chronic pain   . Complication of anesthesia   . Depression   . Family history of adverse reaction to anesthesia    MOther had PONV  . Fibromyalgia Dx 2005  . GERD (gastroesophageal reflux disease)   . Headache    hx  migraines  . Lymphedema of left arm   . Opiate dependence ([place])   . PONV (postoperative nausea and vomiting)   . Port-A-Cath in place   . PTSD (post-traumatic stress disorder)     Past Surgical History:  Procedure Laterality Date  . APPLICATION OF CRANIAL NAVIGATION N/A 08/14/2016   Procedure: APPLICATION OF CRANIAL NAVIGATION;  Surgeon: Erline Levine, MD;  Location: [facility];  Service: Neurosurgery;  Laterality: N/A;  . BREAST RECONSTRUCTION Left     with silicone implant  . COLONOSCOPY W/ POLYPECTOMY    . CRANIOTOMY N/A 08/14/2016   Procedure: CRANIOTOMY TUMOR EXCISION WITH Lucky Rathke;  Surgeon: Erline Levine, MD;  Location: [facility];  Service: Neurosurgery;  Laterality: N/A;  . FIBULA FRACTURE SURGERY Left   . MASTECTOMY Left   . RADIOLOGY WITH ANESTHESIA N/A 07/23/2016   Procedure: MRI OF BRAIN WITH AND WITHOUT;  Surgeon: Medication Radiologist, MD;  Location: [facility];  Service: Radiology;  Laterality: N/A;  . RADIOLOGY WITH ANESTHESIA N/A 09/08/2016   Procedure: MRI OF BRAIN WITH AND WITHOUT CONTRAST;  Surgeon: Medication Radiologist, MD;  Location: [facility];  Service: Radiology;  Laterality: N/A;  . RADIOLOGY WITH ANESTHESIA N/A 12/10/2016   Procedure: MRI OF BRAIN WITH AND WITHOUT;  Surgeon: Medication Radiologist, MD;  Location: [facility];  Service: Radiology;  Laterality: N/A;  . RADIOLOGY WITH ANESTHESIA N/A 03/02/2017   Procedure: MRI of BRAIN W and W/OUT CONTRAST;  Surgeon: Medication Radiologist, MD;  Location: [facility];  Service: Radiology;  Laterality: N/A;  . RADIOLOGY WITH ANESTHESIA N/A 07/29/2017   Procedure: RADIOLOGY WITH ANESTHESIA MRI OF BRAIN WITH AND WITHOUT CONTRAST;  Surgeon: Radiologist, Medication, MD;  Location: [facility];  Service: Radiology;  Laterality: N/A;  .  RADIOLOGY WITH ANESTHESIA N/A 12/07/2017   Procedure: MRI WITH ANESTHESIA OF BRAIN WITH AND WITHOUT CONTRAST;  Surgeon: Radiologist, Medication, MD;  Location: [facility];  Service: Radiology;  Laterality: N/A;  . RADIOLOGY WITH ANESTHESIA N/A 04/07/2018   Procedure: MRI OF BRAIN WITH AND WITHOUT CONTRAST;  Surgeon: Radiologist, Medication, MD;  Location: [facility];  Service: Radiology;  Laterality: N/A;  . RADIOLOGY WITH ANESTHESIA N/A 08/23/2018   Procedure: MRI WITH ANESTHESIA OF THE BRAIN WITH AND WITHOUT;  Surgeon: Radiologist, Medication, MD;  Location: [facility];  Service: Radiology;  Laterality: N/A;  . RADIOLOGY WITH ANESTHESIA N/A 01/24/2019   Procedure: MRI OF BRAIN WITH AND  WITHOUT CONTRAST;  Surgeon: Radiologist, Medication, MD;  Location: [facility];  Service: Radiology;  Laterality: N/A;  . RADIOLOGY WITH ANESTHESIA N/A 07/06/2019   Procedure: MRI WITH ANESTHESIA OF BRAIN WITH AND WITHOUT CONTRAST;  Surgeon: Radiologist, Medication, MD;  Location: [facility];  Service: Radiology;  Laterality: N/A;  . right power port placement Right     There were no vitals filed for this visit.  Subjective Assessment - 09/18/19 0808    Subjective  Feel a little wobbly today.  No falls since last visit.  Back was bothering me yesterday and wasn't able to do my exercises.    Pertinent History  Per [employer] lymphedema eval 07/2019:  Treated here in March of last year currently with a reid sleeve, day sleeve and compression pump. Lt mastectomy and ALND with reconstruction 07/08/15 for T4N2 stage IIIB IDC. Metastatic disease in the brain s/p craniotomy 08/14/16. Chemo completed in 2017 Trastuzumab and Pertuzumab, docetaxel. Radiation completed 2017. Stable bone lesions to the Rt scapula, Lt iliac crest, L4, Tspine. Also probable in the liver and lungs. Pt is also bipolar and a recovered drug and alchohol user.  Current treatment TDM-1, denosumab and anastrozole.    Patient Stated Goals  To get more stability back in legs    Currently in Pain?  No/denies    Pain Score  0-No pain    Pain Onset  More than a month ago                       [facility] Adult PT Treatment/Exercise - 09/18/19 0811      Transfers   Transfers  Sit to Stand;Stand to Sit    Sit to Stand  5: Supervision;With upper extremity assist;From bed;From chair/3-in-1    Five time sit to stand comments   14.85   UEs support; 14.59 no UE support with knees flexed standing   Stand to Sit  5: Supervision;With upper extremity assist;To chair/3-in-1      Ambulation/Gait   Ambulation/Gait  Yes    Ambulation/Gait Assistance  4: Min guard;5: Supervision    Ambulation/Gait Assistance Details  Cues to stay close to rollator  walker, to keep feet under seat of rollator.    Ambulation Distance (Feet)  230 Feet    Assistive device  Rollator    Gait Pattern  Step-through pattern;Decreased arm swing - right;Decreased arm swing - left;Decreased step length - right;Decreased step length - left;Shuffle;Decreased dorsiflexion - right;Decreased dorsiflexion - left;Poor foot clearance - left;Poor foot clearance - right;Decreased trunk rotation    Ambulation Surface  Level;Indoor    Stairs  Yes    Stairs Assistance  5: Supervision    Stair Management Technique  Two rails;Alternating pattern;Forwards    Number of Stairs  4   x 3   Height of Stairs  6    Curb  4: Min assist    Curb Details (indicate cue type and reason)  Pt reaches out for windowsill and for support of therapist, with PT providing HHA and min guard for curb negotiation x 2 reps.      Standardized Balance Assessment   Standardized Balance Assessment  Timed Up and Go Test      Timed Up and Go Test   TUG  Normal TUG    Normal TUG (seconds)  13.75      Self-Care   Self-Care  Other Self-Care Comments    Other Self-Care Comments   REviewed fall prevention, pt with no questions.  Discussed progress towards goals and plans to continue POC, request Medicaid reauth          Balance Exercises - 09/18/19 0831      OTAGO PROGRAM   Knee Bends  10 reps, support    Backwards Walking  Support   x 2 reps at counter   Sideways Walking  Assistive device   x 2 reps at counter   Tandem Stance  10 seconds, support   cues for technique   One Leg Stand  10 seconds, support   cues for technique, hand placement for optimal support   Sit to Stand  5 reps, bilateral support   2 sets       PT Education - 09/18/19 1518    Education Details  Additions to HEP; progress towards goals, POC    Person(s) Educated  Patient    Methods  Explanation;Demonstration;Handout    Comprehension  Verbalized understanding;Returned demonstration;Verbal cues required       PT  Short Term Goals - 09/18/19 0810      PT SHORT TERM GOAL #1   Title  Pt will be independent with HEP to address balance, strength, and gait.  TARGET 3 weeks (09/22/2019)    Baseline  Initiated OTAGO HEP, with pt needing cues for correct technique of exercises    Time  3    Period  Weeks    Status  Partially Met    Target Date  09/22/19      PT SHORT TERM GOAL #2   Title  Pt will improve TUG score to less than or equal to 13.5 seconds for decreased fall risk.    Baseline  TUG score 17.19 seconds (>13.5 seconds indicates increased fall risk) at eval; 13.03 sec at best 09/18/2019    Time  3    Period  Weeks    Status  Achieved    Target Date  09/22/19      PT SHORT TERM GOAL #3   Title  Pt will verbalize fall prevention in home envrionment.    Baseline  At fall risk per TUG, DGI scores    Time  3    Period  Weeks    Status  Achieved    Target Date  09/22/19        PT Long Term Goals - 09/18/19 1523      PT LONG TERM GOAL #1   Title  Pt will be independent with progression of balance exercise program for decreased fall risk.  TARGET 10/20/2019    Baseline  No formal balance HEP    Time  7    Period  Weeks    Status  On-going      PT LONG TERM GOAL #2   Title  Pt will improve DGI score to at least 16/24 for decreased fall  risk.    Baseline  DGI score 11/24 (Scores <19/24 indicate increased fall risk)    Time  7    Status  On-going      PT LONG TERM GOAL #3   Title  Pt will improve gait velocity to at least 2.3 ft/sec for improved gait efficiency and safety.    Baseline  Gait velocity 1.95 ft/sec (1.31-2.62 f/tsec indicates limited community ambulator)    Time  7    Period  Weeks    Status  On-going      PT LONG TERM GOAL #4   Title  Pt will negotiate 1 curb step to simulate step up into home, no rail, independentlyf or improved safety with curbs/home entry.    Baseline  Pt reports one near fall with curb step; unable to perform stepping over obstacle without stopping,  SLS <1 second each leg    Time  7    Period  Weeks    Status  On-going            Plan - 09/18/19 1520    Clinical Impression Statement  Assessed STGs this visit, with pt meeting 2 of 3 STGs.  STG 1 partially met for HEP (needing occasional cues for correct technique), STG 2 met for improved TUG score; STG 3 met for fall prevention education.  PT has requested order from her MD for rollator walker, due to increased fall risk, including score of 34/56 on Berg Balance test and fatigue with gait.  Pt will continue to beneift from skilled PT to further address balance, strength, and gait training due to pt still at high fall risk based on Berg and DGI measures.    Personal Factors and Comorbidities  Comorbidity 3+    Comorbidities  See PMH (subjective); metastatic breast cancer, hx of bipolar and anxiety    Examination-Activity Limitations  Stairs   Curbs   Examination-Participation Restrictions  --   Work   Stability/Clinical Decision Making  Stable/Uncomplicated    Rehab Potential  Good    PT Frequency  Other (comment)   1x/wk for 3 weeks, then 2x/wk for 4 weeks   PT Duration  --   7 weeks total POC   PT Treatment/Interventions  ADLs/Self Care Home Management;Neuromuscular re-education;Gait training;Stair training;Patient/family education;Therapeutic activities;Functional mobility training;Therapeutic exercise;Balance training;DME Instruction    PT Next Visit Plan  Review additions to HEP and add to OTAGO as appropriate; gait training (rollator versus RW) for improved foot clearance; curb and step negotiation;functional strengtheninga nd balance; check on rollator walker order from NP    Consulted and Agree with Plan of Care  Patient       Patient will benefit from skilled therapeutic intervention in order to improve the following deficits and impairments:  Abnormal gait, Difficulty walking, Decreased mobility, Decreased strength, Decreased balance  Visit Diagnosis: Unsteadiness on  feet  Muscle weakness (generalized)  Other abnormalities of gait and mobility     Problem List Patient Active Problem List   Diagnosis Date Noted  . Thrombocytopenia ([place]) 08/09/2019  . Port-A-Cath in place 06/20/2019  . 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 S/P mastectomy, left 12/09/2018  . S/P breast reconstruction, left 12/09/2018  . S/P radiation therapy 12/09/2018  . GERD (gastroesophageal reflux disease) 12/10/2017  . Edema 11/09/2017  . Sensorineural hearing loss (SNHL) of both ears 11/05/2017  . Vitamin D deficiency 01/18/2017  . Bipolar I disorder, most recent episode depressed ([place]) 12/08/2016  . Adjustment disorder  with anxiety 12/06/2016  . Metastatic breast cancer ([place])   . History of cancer metastatic to brain 07/27/2016  . Iron deficiency anemia 06/26/2016  . Bone metastases ([place]) 06/03/2016  . Primary cancer of lower-inner quadrant of left female breast ([place]) 06/01/2016  . Current smoker 03/28/2015  . Seasonal allergies 03/28/2015  . Anxiety state 02/28/2015  . Fibromyalgia 02/28/2015  . Family history of diabetes mellitus 02/28/2015  . H/O alcohol abuse     Tyrene Nader W. 09/18/2019, 3:25 PM  Frazier Butt., PT   [facility] [address] [place], [place], [address] Phone: [phone]   Fax:  [phone]  Name: Nidya Bouyer MRN: [id number] Date of Birth: September 28, 1961

## 2019-09-18 NOTE — Patient Instructions (Signed)
Added SLS and tandem stance pictures into her current OTAGO HEP

## 2019-09-19 ENCOUNTER — Inpatient Hospital Stay: Payer: Medicaid Other | Attending: Medical

## 2019-09-19 ENCOUNTER — Inpatient Hospital Stay: Payer: Medicaid Other

## 2019-09-19 ENCOUNTER — Other Ambulatory Visit: Payer: Self-pay

## 2019-09-19 VITALS — BP 129/77 | HR 95 | Temp 98.3°F | Resp 16 | Wt 149.0 lb

## 2019-09-19 DIAGNOSIS — C50919 Malignant neoplasm of unspecified site of unspecified female breast: Secondary | ICD-10-CM

## 2019-09-19 DIAGNOSIS — C50312 Malignant neoplasm of lower-inner quadrant of left female breast: Secondary | ICD-10-CM | POA: Diagnosis present

## 2019-09-19 DIAGNOSIS — C7931 Secondary malignant neoplasm of brain: Secondary | ICD-10-CM | POA: Insufficient documentation

## 2019-09-19 DIAGNOSIS — Z5112 Encounter for antineoplastic immunotherapy: Secondary | ICD-10-CM | POA: Diagnosis present

## 2019-09-19 DIAGNOSIS — C7951 Secondary malignant neoplasm of bone: Secondary | ICD-10-CM | POA: Diagnosis not present

## 2019-09-19 LAB — COMPREHENSIVE METABOLIC PANEL
ALT: 20 U/L (ref 0–44)
AST: 40 U/L (ref 15–41)
Albumin: 3.6 g/dL (ref 3.5–5.0)
Alkaline Phosphatase: 86 U/L (ref 38–126)
Anion gap: 9 (ref 5–15)
BUN: 8 mg/dL (ref 6–20)
CO2: 27 mmol/L (ref 22–32)
Calcium: 8.9 mg/dL (ref 8.9–10.3)
Chloride: 104 mmol/L (ref 98–111)
Creatinine, Ser: 0.8 mg/dL (ref 0.44–1.00)
GFR calc Af Amer: >60 mL/min (ref >60)
GFR calc non Af Amer: >60 mL/min (ref >60)
Glucose, Bld: 102 mg/dL — ABNORMAL HIGH (ref 70–99)
Potassium: 3.6 mmol/L (ref 3.5–5.1)
Sodium: 140 mmol/L (ref 135–145)
Total Bilirubin: 0.4 mg/dL (ref 0.3–1.2)
Total Protein: 6.8 g/dL (ref 6.5–8.1)

## 2019-09-19 LAB — CBC WITH DIFFERENTIAL/PLATELET
Abs Immature Granulocytes: 0.01 10*3/uL (ref 0.00–0.07)
Basophils Absolute: 0 10*3/uL (ref 0.0–0.1)
Basophils Relative: 1 %
Eosinophils Absolute: 0 10*3/uL (ref 0.0–0.5)
Eosinophils Relative: 0 %
HCT: 37.8 % (ref 36.0–46.0)
Hemoglobin: 12.2 g/dL (ref 12.0–15.0)
Immature Granulocytes: 0 %
Lymphocytes Relative: 27 %
Lymphs Abs: 0.7 10*3/uL (ref 0.7–4.0)
MCH: 28.7 pg (ref 26.0–34.0)
MCHC: 32.3 g/dL (ref 30.0–36.0)
MCV: 88.9 fL (ref 80.0–100.0)
Monocytes Absolute: 0.3 10*3/uL (ref 0.1–1.0)
Monocytes Relative: 11 %
Neutro Abs: 1.6 10*3/uL — ABNORMAL LOW (ref 1.7–7.7)
Neutrophils Relative %: 61 %
Platelets: 42 10*3/uL — ABNORMAL LOW (ref 150–400)
RBC: 4.25 MIL/uL (ref 3.87–5.11)
RDW: 14.7 % (ref 11.5–15.5)
WBC: 2.7 10*3/uL — ABNORMAL LOW (ref 4.0–10.5)
nRBC: 0 % (ref 0.0–0.2)

## 2019-09-19 MED ORDER — DENOSUMAB 120 MG/1.7ML ~~LOC~~ SOLN
SUBCUTANEOUS | Status: AC
  Filled 2019-09-19: qty 1.7

## 2019-09-19 MED ORDER — ACETAMINOPHEN 325 MG PO TABS
ORAL_TABLET | ORAL | Status: AC
  Filled 2019-09-19: qty 2

## 2019-09-19 MED ORDER — DIPHENHYDRAMINE HCL 25 MG PO CAPS: 25.0000 mg | ORAL_CAPSULE | Freq: Once | ORAL | Status: DC

## 2019-09-19 MED ORDER — DENOSUMAB 120 MG/1.7ML ~~LOC~~ SOLN
120.0000 mg | Freq: Once | SUBCUTANEOUS | Status: AC
  Administered 2019-09-19: 120 mg via SUBCUTANEOUS

## 2019-09-19 MED ORDER — SODIUM CHLORIDE 0.9 % IV SOLN
260.0000 mg | Freq: Once | INTRAVENOUS | Status: AC
  Administered 2019-09-19: 260 mg via INTRAVENOUS
  Filled 2019-09-19: qty 5

## 2019-09-19 MED ORDER — HEPARIN SOD (PORK) LOCK FLUSH 100 UNIT/ML IV SOLN
500.0000 [IU] | Freq: Once | INTRAVENOUS | Status: AC | PRN
  Administered 2019-09-19: 500 [IU]
  Filled 2019-09-19: qty 5

## 2019-09-19 MED ORDER — ACETAMINOPHEN 325 MG PO TABS
650.0000 mg | ORAL_TABLET | Freq: Once | ORAL | Status: AC
  Administered 2019-09-19: 650 mg via ORAL

## 2019-09-19 MED ORDER — SODIUM CHLORIDE 0.9 % IV SOLN
Freq: Once | INTRAVENOUS | Status: AC
  Administered 2019-09-19: 10:00:00 via INTRAVENOUS
  Filled 2019-09-19: qty 250

## 2019-09-19 MED ORDER — SODIUM CHLORIDE 0.9% FLUSH
10.0000 mL | INTRAVENOUS | Status: DC | PRN
  Administered 2019-09-19: 10 mL
  Filled 2019-09-19: qty 10

## 2019-09-19 NOTE — Progress Notes (Signed)
Patient declined to stay 30 minute post observation period for [place].

## 2019-09-19 NOTE — Patient Instructions (Signed)
[facility] Discharge Instructions for Patients Receiving Chemotherapy  Today you received the following chemotherapy agents Ado-trastuzumab (KADCYLA).  To help prevent nausea and vomiting after your treatment, we encourage you to take your nausea medication as prescribed.   If you develop nausea and vomiting that is not controlled by your nausea medication, call the clinic.   BELOW ARE SYMPTOMS THAT SHOULD BE REPORTED IMMEDIATELY:  *FEVER GREATER THAN 100.5 F  *CHILLS WITH OR WITHOUT FEVER  NAUSEA AND VOMITING THAT IS NOT CONTROLLED WITH YOUR NAUSEA MEDICATION  *UNUSUAL SHORTNESS OF BREATH  *UNUSUAL BRUISING OR BLEEDING  TENDERNESS IN MOUTH AND THROAT WITH OR WITHOUT PRESENCE OF ULCERS  *URINARY PROBLEMS  *BOWEL PROBLEMS  UNUSUAL RASH Items with * indicate a potential emergency and should be followed up as soon as possible.  Feel free to call the clinic should you have any questions or concerns. The clinic phone number is [phone].  Please show the CHEMO ALERT CARD at check-in to the Emergency Department and triage nurse.  Coronavirus (COVID-19) Are you at risk?  Are you at risk for the Coronavirus (COVID-19)?  To be considered HIGH RISK for Coronavirus (COVID-19), you have to meet the following criteria:  . Traveled to [place], [place], [place], [place] or [place]; or in the [place] to [place], [place], [place], or [place]; and have fever, cough, and shortness of breath within the last 2 weeks of travel OR . Been in close contact with a person diagnosed with COVID-19 within the last 2 weeks and have fever, cough, and shortness of breath . IF YOU DO NOT MEET THESE CRITERIA, YOU ARE CONSIDERED LOW RISK FOR COVID-19.  What to do if you are HIGH RISK for COVID-19?  . If you are having a medical emergency, call 911. . Seek medical care right away. Before you go to a doctor's office, urgent care or emergency department, call ahead and tell  them about your recent travel, contact with someone diagnosed with COVID-19, and your symptoms. You should receive instructions from your physician's office regarding next steps of care.  . When you arrive at healthcare provider, tell the healthcare staff immediately you have returned from visiting [place], [place], [place], [place] or [place]; or traveled in the [place] to [place], [place], [place], or [place]; in the last two weeks or you have been in close contact with a person diagnosed with COVID-19 in the last 2 weeks.   . Tell the health care staff about your symptoms: fever, cough and shortness of breath. . After you have been seen by a medical provider, you will be either: o Tested for (COVID-19) and discharged home on quarantine except to seek medical care if symptoms worsen, and asked to  - Stay home and avoid contact with others until you get your results (4-5 days)  - Avoid travel on public transportation if possible (such as bus, train, or airplane) or o Sent to the Emergency Department by EMS for evaluation, COVID-19 testing, and possible admission depending on your condition and test results.  What to do if you are LOW RISK for COVID-19?  Reduce your risk of any infection by using the same precautions used for avoiding the common cold or flu:  . Wash your hands often with soap and warm water for at least 20 seconds.  If soap and water are not readily available, use an alcohol-based hand sanitizer with at least 60% alcohol.  . If coughing or   sneezing, cover your mouth and nose by coughing or sneezing into the elbow areas of your shirt or coat, into a tissue or into your sleeve (not your hands). . Avoid shaking hands with others and consider head nods or verbal greetings only. . Avoid touching your eyes, nose, or mouth with unwashed hands.  . Avoid close contact with people who are sick. . Avoid places or events with large numbers of people in one location, like concerts or  sporting events. . Carefully consider travel plans you have or are making. . If you are planning any travel outside or inside the [place], visit the CDC's Travelers' Health webpage for the latest health notices. . If you have some symptoms but not all symptoms, continue to monitor at home and seek medical attention if your symptoms worsen. . If you are having a medical emergency, call 911.   [facility] / e-Visit: [email or web address]         [facility]: [facility]: [phone]                   [facility]: [phone]

## 2019-09-19 NOTE — Patient Instructions (Signed)

## 2019-09-20 ENCOUNTER — Other Ambulatory Visit: Payer: Self-pay

## 2019-09-20 DIAGNOSIS — R2681 Unsteadiness on feet: Secondary | ICD-10-CM

## 2019-09-20 LAB — CANCER ANTIGEN 27.29: CA 27.29: 13.7 U/mL (ref 0.0–38.6)

## 2019-09-20 NOTE — Progress Notes (Signed)
Orders placed for rolling walker per PT recommendations.

## 2019-09-28 ENCOUNTER — Encounter: Payer: Self-pay | Admitting: Physical Therapy

## 2019-09-28 ENCOUNTER — Other Ambulatory Visit: Payer: Self-pay

## 2019-09-28 ENCOUNTER — Ambulatory Visit: Payer: Medicaid Other | Admitting: Physical Therapy

## 2019-09-28 DIAGNOSIS — M6281 Muscle weakness (generalized): Secondary | ICD-10-CM

## 2019-09-28 DIAGNOSIS — R2681 Unsteadiness on feet: Secondary | ICD-10-CM | POA: Diagnosis not present

## 2019-09-28 DIAGNOSIS — R2689 Other abnormalities of gait and mobility: Secondary | ICD-10-CM

## 2019-09-29 ENCOUNTER — Encounter: Payer: Self-pay | Admitting: Physical Therapy

## 2019-09-29 ENCOUNTER — Telehealth: Payer: Self-pay

## 2019-09-29 ENCOUNTER — Telehealth: Payer: Self-pay | Admitting: Physical Therapy

## 2019-09-29 ENCOUNTER — Ambulatory Visit: Payer: Medicaid Other | Admitting: Physical Therapy

## 2019-09-29 DIAGNOSIS — R2681 Unsteadiness on feet: Secondary | ICD-10-CM

## 2019-09-29 DIAGNOSIS — R2689 Other abnormalities of gait and mobility: Secondary | ICD-10-CM

## 2019-09-29 NOTE — Telephone Encounter (Signed)
Received vm from Mady Haagensen, PT at [facility].  Msg states she has worked with pt past 2 days and pt reports falling at home prior to appt past 2 days.  Amy states pt has hit head and eye in same place with both falls and has bruise and swelling. This RN called pt to get her scheduled for appt here today to be evaluated.  LVM for pt to call back ASAP so she can be evaluated.

## 2019-09-29 NOTE — Therapy (Signed)
[facility] [address] [place], [place], [address] Phone: [phone]   Fax:  [phone]  Physical Therapy Treatment  Patient Details  Name: [name] MRN: [id number] Date of Birth: 04-11-1961 Referring Provider (PT): Wilber Bihari, NP   Encounter Date: 09/29/2019  PT End of Session - 09/29/19 1226    Visit Number  6    Number of Visits  12    Date for PT Re-Evaluation  11/20/19    Authorization Type  Medicaid (per email from Antony Salmon and Trudee Kuster 08/22/2019, >1 medicaid eval is allowed in a year);    Authorization Time Period  08/31/2019-09/20/2019; 09/28/19-10/25/19 (8 visits)    Authorization - Visit Number  2    Authorization - Number of Visits  8    PT Start Time  0847    PT Stop Time  0927    PT Time Calculation (min)  40 min    Equipment Utilized During Treatment  Gait belt    Activity Tolerance  Patient tolerated treatment well;Patient limited by fatigue    Behavior During Therapy  Impulsive   decreased safety awareness      Past Medical History:  Diagnosis Date  . Alcohol abuse   . Anemia    during chemo  . Anxiety    At age 58  . Arthritis Dx 2010  . Bipolar disorder ([place])   . Bronchitis   . Cancer ([place])    breast mets to brain  . Chronic pain   . Complication of anesthesia   . Depression   . Family history of adverse reaction to anesthesia    MOther had PONV  . Fibromyalgia Dx 2005  . GERD (gastroesophageal reflux disease)   . Headache    hx  migraines  . Lymphedema of left arm   . Opiate dependence ([place])   . PONV (postoperative nausea and vomiting)   . Port-A-Cath in place   . PTSD (post-traumatic stress disorder)     Past Surgical History:  Procedure Laterality Date  . APPLICATION OF CRANIAL NAVIGATION N/A 08/14/2016   Procedure: APPLICATION OF CRANIAL NAVIGATION;  Surgeon: Erline Levine, MD;  Location: [facility];  Service: Neurosurgery;  Laterality: N/A;  . BREAST  RECONSTRUCTION Left    with silicone implant  . COLONOSCOPY W/ POLYPECTOMY    . CRANIOTOMY N/A 08/14/2016   Procedure: CRANIOTOMY TUMOR EXCISION WITH Lucky Rathke;  Surgeon: Erline Levine, MD;  Location: [facility];  Service: Neurosurgery;  Laterality: N/A;  . FIBULA FRACTURE SURGERY Left   . MASTECTOMY Left   . RADIOLOGY WITH ANESTHESIA N/A 07/23/2016   Procedure: MRI OF BRAIN WITH AND WITHOUT;  Surgeon: Medication Radiologist, MD;  Location: [facility];  Service: Radiology;  Laterality: N/A;  . RADIOLOGY WITH ANESTHESIA N/A 09/08/2016   Procedure: MRI OF BRAIN WITH AND WITHOUT CONTRAST;  Surgeon: Medication Radiologist, MD;  Location: [facility];  Service: Radiology;  Laterality: N/A;  . RADIOLOGY WITH ANESTHESIA N/A 12/10/2016   Procedure: MRI OF BRAIN WITH AND WITHOUT;  Surgeon: Medication Radiologist, MD;  Location: [facility];  Service: Radiology;  Laterality: N/A;  . RADIOLOGY WITH ANESTHESIA N/A 03/02/2017   Procedure: MRI of BRAIN W and W/OUT CONTRAST;  Surgeon: Medication Radiologist, MD;  Location: [facility];  Service: Radiology;  Laterality: N/A;  . RADIOLOGY WITH ANESTHESIA N/A 07/29/2017   Procedure: RADIOLOGY WITH ANESTHESIA MRI OF BRAIN WITH AND WITHOUT CONTRAST;  Surgeon: Radiologist, Medication, MD;  Location: [facility];  Service: Radiology;  Laterality: N/A;  . RADIOLOGY WITH ANESTHESIA N/A 12/07/2017   Procedure: MRI WITH ANESTHESIA OF BRAIN WITH AND WITHOUT CONTRAST;  Surgeon: Radiologist, Medication, MD;  Location: [facility];  Service: Radiology;  Laterality: N/A;  . RADIOLOGY WITH ANESTHESIA N/A 04/07/2018   Procedure: MRI OF BRAIN WITH AND WITHOUT CONTRAST;  Surgeon: Radiologist, Medication, MD;  Location: [facility];  Service: Radiology;  Laterality: N/A;  . RADIOLOGY WITH ANESTHESIA N/A 08/23/2018   Procedure: MRI WITH ANESTHESIA OF THE BRAIN WITH AND WITHOUT;  Surgeon: Radiologist, Medication, MD;  Location: [facility];  Service: Radiology;  Laterality: N/A;  . RADIOLOGY WITH ANESTHESIA N/A 01/24/2019   Procedure:  MRI OF BRAIN WITH AND WITHOUT CONTRAST;  Surgeon: Radiologist, Medication, MD;  Location: [facility];  Service: Radiology;  Laterality: N/A;  . RADIOLOGY WITH ANESTHESIA N/A 07/06/2019   Procedure: MRI WITH ANESTHESIA OF BRAIN WITH AND WITHOUT CONTRAST;  Surgeon: Radiologist, Medication, MD;  Location: [facility];  Service: Radiology;  Laterality: N/A;  . right power port placement Right     There were no vitals filed for this visit.  Subjective Assessment - 09/29/19 0858    Subjective  Had a fall this morning, tripped on my bedroom shoes.  Hit my eye again.    Pertinent History  Per [employer] lymphedema eval 07/2019:  Treated here in March of last year currently with a reid sleeve, day sleeve and compression pump. Lt mastectomy and ALND with reconstruction 07/08/15 for T4N2 stage IIIB IDC. Metastatic disease in the brain s/p craniotomy 08/14/16. Chemo completed in 2017 Trastuzumab and Pertuzumab, docetaxel. Radiation completed 2017. Stable bone lesions to the Rt scapula, Lt iliac crest, L4, Tspine. Also probable in the liver and lungs. Pt is also bipolar and a recovered drug and alchohol user.  Current treatment TDM-1, denosumab and anastrozole.    Limitations  Other (comment)    Patient Stated Goals  To get more stability back in legs    Currently in Pain?  Yes    Pain Score  3     Pain Location  Eye    Pain Orientation  Left    Pain Descriptors / Indicators  Aching    Pain Type  Acute pain   2 falls in past 3 days   Pain Onset  In the past 7 days    Pain Frequency  Intermittent    Aggravating Factors   fall    Pain Relieving Factors  ice                       [facility] Adult PT Treatment/Exercise - 09/29/19 0001      Transfers   Transfers  Sit to Stand;Stand to Sit    Sit to Stand  5: Supervision;With upper extremity assist;From bed;From chair/3-in-1    Sit to Stand Details (indicate cue type and reason)  Cues for hand placement, to not use hands on rollator to stand    Stand to  Sit  5: Supervision;With upper extremity assist;To chair/3-in-1    Stand to Sit Details  Cues for hand placement, to let go of rollator prior to sitting      Ambulation/Gait   Ambulation/Gait  Yes    Ambulation/Gait Assistance  5: Supervision;4: Min guard    Ambulation/Gait Assistance Details  Use of rollator, around furniture, turns and changes of directions.  Cues for locking/unlocking brakes and positioning of rollator for transfers    Ambulation Distance (Feet)  200 Feet   250  Assistive device  Rollator    Gait Pattern  Step-through pattern;Decreased arm swing - right;Decreased arm swing - left;Decreased step length - right;Decreased step length - left;Shuffle;Decreased dorsiflexion - right;Decreased dorsiflexion - left;Poor foot clearance - left;Poor foot clearance - right;Decreased trunk rotation    Ambulation Surface  Level;Indoor    Gait Comments  Occasional cues throughout gait to slow pace, to stay close within BOS of walker.  Pt has 2 episodes of hitting objects on L side, needing cues to slow pace and veer wider to avoid furniture obstacles.      Self-Care   Self-Care  Other Self-Care Comments    Other Self-Care Comments   Given pt's additional fall (2 falls in 3 days), with fall resulting in hitting head and L eye again, PT assessed for any trauma, with pt denying headache, LOC, dizziness or blurred vision.  PT contacted by phone and by staff message her oncologist/referring MD to make aware of these falls.  Discussed again, fall prevention, safety awareness and need for supervision with mobility for improved safety.  Pt has not yet gotten rollator, gave patient directions again to [facility] for getting rollator (she already has MD order).  Discussed she is still at fall risk even with rollator, needing supervision to use correctly and improve safety.             PT Education - 09/29/19 1225    Education Details  Use of rollator for safety with gait; if possible, to have  supervision when ambulating due to recent 2 falls in 3 days.  Reviewed fall prevention    Person(s) Educated  Patient    Methods  Explanation    Comprehension  Verbalized understanding       PT Short Term Goals - 09/18/19 0810      PT SHORT TERM GOAL #1   Title  Pt will be independent with HEP to address balance, strength, and gait.  TARGET 3 weeks (09/22/2019)    Baseline  Initiated OTAGO HEP, with pt needing cues for correct technique of exercises    Time  3    Period  Weeks    Status  Partially Met    Target Date  09/22/19      PT SHORT TERM GOAL #2   Title  Pt will improve TUG score to less than or equal to 13.5 seconds for decreased fall risk.    Baseline  TUG score 17.19 seconds (>13.5 seconds indicates increased fall risk) at eval; 13.03 sec at best 09/18/2019    Time  3    Period  Weeks    Status  Achieved    Target Date  09/22/19      PT SHORT TERM GOAL #3   Title  Pt will verbalize fall prevention in home envrionment.    Baseline  At fall risk per TUG, DGI scores    Time  3    Period  Weeks    Status  Achieved    Target Date  09/22/19        PT Long Term Goals - 09/18/19 1523      PT LONG TERM GOAL #1   Title  Pt will be independent with progression of balance exercise program for decreased fall risk.  TARGET 10/20/2019    Baseline  No formal balance HEP    Time  7    Period  Weeks    Status  On-going      PT LONG TERM GOAL #2  Title  Pt will improve DGI score to at least 16/24 for decreased fall risk.    Baseline  DGI score 11/24 (Scores <19/24 indicate increased fall risk)    Time  7    Status  On-going      PT LONG TERM GOAL #3   Title  Pt will improve gait velocity to at least 2.3 ft/sec for improved gait efficiency and safety.    Baseline  Gait velocity 1.95 ft/sec (1.31-2.62 f/tsec indicates limited community ambulator)    Time  7    Period  Weeks    Status  On-going      PT LONG TERM GOAL #4   Title  Pt will negotiate 1 curb step to  simulate step up into home, no rail, independentlyf or improved safety with curbs/home entry.    Baseline  Pt reports one near fall with curb step; unable to perform stepping over obstacle without stopping, SLS <1 second each leg    Time  7    Period  Weeks    Status  On-going            Plan - 09/29/19 1407    Clinical Impression Statement  Pt reports an additional fall this morning (2 falls in past 3 days), hitting her head/L eye again.  She denies any symptoms associated with head injury, but L eye is swollen and bruised.  PT made her physician's office aware of recent 2 falls and eye injury.  Educated again in use of rollator walker (pt has not yet gone to get rollator) and educated in likely need for supervision (?with roommate) with all gait for improved safety with mobility.    Personal Factors and Comorbidities  Comorbidity 3+    Comorbidities  See PMH (subjective); metastatic breast cancer, hx of bipolar and anxiety    Examination-Activity Limitations  Stairs   Curbs   Examination-Participation Restrictions  --   Work   Stability/Clinical Decision Making  Stable/Uncomplicated    Rehab Potential  Good    PT Frequency  Other (comment)   1x/wk for 3 weeks, then 2x/wk for 4 weeks   PT Duration  --   7 weeks total POC   PT Treatment/Interventions  ADLs/Self Care Home Management;Neuromuscular re-education;Gait training;Stair training;Patient/family education;Therapeutic activities;Functional mobility training;Therapeutic exercise;Balance training;DME Instruction    PT Next Visit Plan  Has pt obtained rollator?; gait training for improved foot clearance, curb/step negoitation, functional stregnthening and balance; ask pt if roommate can come in (or whoever can provide additional supervision) for safety training with gait    Consulted and Agree with Plan of Care  Patient       Patient will benefit from skilled therapeutic intervention in order to improve the following deficits and  impairments:  Abnormal gait, Difficulty walking, Decreased mobility, Decreased strength, Decreased balance  Visit Diagnosis: Unsteadiness on feet  Other abnormalities of gait and mobility     Problem List Patient Active Problem List   Diagnosis Date Noted  . Thrombocytopenia ([place]) 08/09/2019  . Port-A-Cath in place 06/20/2019  . 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 S/P mastectomy, left 12/09/2018  . S/P breast reconstruction, left 12/09/2018  . S/P radiation therapy 12/09/2018  . GERD (gastroesophageal reflux disease) 12/10/2017  . Edema 11/09/2017  . Sensorineural hearing loss (SNHL) of both ears 11/05/2017  . Vitamin D deficiency 01/18/2017  . Bipolar I disorder, most recent episode depressed ([place]) 12/08/2016  . Adjustment disorder with anxiety 12/06/2016  .  Metastatic breast cancer ([place])   . History of cancer metastatic to brain 07/27/2016  . Iron deficiency anemia 06/26/2016  . Bone metastases ([place]) 06/03/2016  . Primary cancer of lower-inner quadrant of left female breast ([place]) 06/01/2016  . Current smoker 03/28/2015  . Seasonal allergies 03/28/2015  . Anxiety state 02/28/2015  . Fibromyalgia 02/28/2015  . Family history of diabetes mellitus 02/28/2015  . H/O alcohol abuse     Genavie Boettger W. 09/29/2019, 2:15 PM Frazier Butt., PT [facility] [address] [place], [place], [address] Phone: [phone]   Fax:  [phone]  Name: Mily Malecki MRN: [id number] Date of Birth: 08-21-1961

## 2019-09-29 NOTE — Telephone Encounter (Signed)
[name] presented to OPPT today and yesterday with having 2 falls in the past 3 days.  The first fall was at the top step and she fell, hitting her head/L eye.  The second fall was early this morning, again falling and hitting her head/L eye.  She is complaining of soreness around the L eye, with bruising and swelling noted.  She did not report LOC, and she does not report headache, blurred vision, or change in mental status.    She has been provided with written order for rollator walker and she was given information on how to obtain.  She has not gotten that yet.  I wanted to make you aware of these falls, as they have been continuing, frequent, and both falls have caused injury to head/eye.    Thank you.  Mady Haagensen, PT 09/29/19 9:11 AM Phone: [phone] Fax: [phone]

## 2019-09-29 NOTE — Therapy (Signed)
[facility] [address] [place], [place], [address] Phone: [phone]   Fax:  [phone]  Physical Therapy Treatment  Patient Details  Name: [name] MRN: [id number] Date of Birth: [birth year] Referring Provider (PT): Wilber Bihari, NP   Encounter Date: 09/27/2057  PT End of Session - 09/28/19 1858    Visit Number  5    Number of Visits  12    Date for PT Re-Evaluation  11/20/19    Authorization Type  Medicaid (per email from Antony Salmon and Trudee Kuster 08/22/2019, >1 medicaid eval is allowed in a year); 09/18/2019:  requesting Medicaid reauth    Authorization Time Period  08/31/2019-09/20/2019    Authorization - Visit Number  1    Authorization - Number of Visits  8    PT Start Time  1016    PT Stop Time  1100    PT Time Calculation (min)  44 min    Equipment Utilized During Treatment  Gait belt    Activity Tolerance  Patient tolerated treatment well;Patient limited by fatigue    Behavior During Therapy  Impulsive   decreased safety awareness      Past Medical History:  Diagnosis Date  . Alcohol abuse   . Anemia    during chemo  . Anxiety    At age 58  . Arthritis Dx 2010  . Bipolar disorder ([place])   . Bronchitis   . Cancer ([place])    breast mets to brain  . Chronic pain   . Complication of anesthesia   . Depression   . Family history of adverse reaction to anesthesia    MOther had PONV  . Fibromyalgia Dx 2005  . GERD (gastroesophageal reflux disease)   . Headache    hx  migraines  . Lymphedema of left arm   . Opiate dependence ([place])   . PONV (postoperative nausea and vomiting)   . Port-A-Cath in place   . PTSD (post-traumatic stress disorder)     Past Surgical History:  Procedure Laterality Date  . APPLICATION OF CRANIAL NAVIGATION N/A 08/14/2016   Procedure: APPLICATION OF CRANIAL NAVIGATION;  Surgeon: Erline Levine, MD;  Location: [facility];  Service: Neurosurgery;  Laterality: N/A;   . BREAST RECONSTRUCTION Left    with silicone implant  . COLONOSCOPY W/ POLYPECTOMY    . CRANIOTOMY N/A 08/14/2016   Procedure: CRANIOTOMY TUMOR EXCISION WITH Lucky Rathke;  Surgeon: Erline Levine, MD;  Location: [facility];  Service: Neurosurgery;  Laterality: N/A;  . FIBULA FRACTURE SURGERY Left   . MASTECTOMY Left   . RADIOLOGY WITH ANESTHESIA N/A 07/23/2016   Procedure: MRI OF BRAIN WITH AND WITHOUT;  Surgeon: Medication Radiologist, MD;  Location: [facility];  Service: Radiology;  Laterality: N/A;  . RADIOLOGY WITH ANESTHESIA N/A 09/08/2016   Procedure: MRI OF BRAIN WITH AND WITHOUT CONTRAST;  Surgeon: Medication Radiologist, MD;  Location: [facility];  Service: Radiology;  Laterality: N/A;  . RADIOLOGY WITH ANESTHESIA N/A 12/10/2016   Procedure: MRI OF BRAIN WITH AND WITHOUT;  Surgeon: Medication Radiologist, MD;  Location: [facility];  Service: Radiology;  Laterality: N/A;  . RADIOLOGY WITH ANESTHESIA N/A 03/02/2017   Procedure: MRI of BRAIN W and W/OUT CONTRAST;  Surgeon: Medication Radiologist, MD;  Location: [facility];  Service: Radiology;  Laterality: N/A;  . RADIOLOGY WITH ANESTHESIA N/A 07/29/2017   Procedure: RADIOLOGY WITH ANESTHESIA MRI OF BRAIN WITH AND WITHOUT CONTRAST;  Surgeon: Radiologist, Medication, MD;  Location: [facility];  Service: Radiology;  Laterality: N/A;  . RADIOLOGY WITH ANESTHESIA N/A 12/07/2017   Procedure: MRI WITH ANESTHESIA OF BRAIN WITH AND WITHOUT CONTRAST;  Surgeon: Radiologist, Medication, MD;  Location: [facility];  Service: Radiology;  Laterality: N/A;  . RADIOLOGY WITH ANESTHESIA N/A 04/07/2018   Procedure: MRI OF BRAIN WITH AND WITHOUT CONTRAST;  Surgeon: Radiologist, Medication, MD;  Location: [facility];  Service: Radiology;  Laterality: N/A;  . RADIOLOGY WITH ANESTHESIA N/A 08/23/2018   Procedure: MRI WITH ANESTHESIA OF THE BRAIN WITH AND WITHOUT;  Surgeon: Radiologist, Medication, MD;  Location: [facility];  Service: Radiology;  Laterality: N/A;  . RADIOLOGY WITH ANESTHESIA N/A 01/24/2019    Procedure: MRI OF BRAIN WITH AND WITHOUT CONTRAST;  Surgeon: Radiologist, Medication, MD;  Location: [facility];  Service: Radiology;  Laterality: N/A;  . RADIOLOGY WITH ANESTHESIA N/A 07/06/2019   Procedure: MRI WITH ANESTHESIA OF BRAIN WITH AND WITHOUT CONTRAST;  Surgeon: Radiologist, Medication, MD;  Location: [facility];  Service: Radiology;  Laterality: N/A;  . right power port placement Right     There were no vitals filed for this visit.  Subjective Assessment - 09/28/19 1022    Subjective  Had a fall yesterday going up stairs. Missed the last step and feel foward. Has a place on her left eye from where her glasses hit her eye. Also skinner her left knee. Denies any headache, no loss of consiousness. People near her helped her up. No other complaitns.    Pertinent History  Per [employer] lymphedema eval 07/2019:  Treated here in March of last year currently with a reid sleeve, day sleeve and compression pump. Lt mastectomy and ALND with reconstruction 07/08/15 for T4N2 stage IIIB IDC. Metastatic disease in the brain s/p craniotomy 08/14/16. Chemo completed in 2017 Trastuzumab and Pertuzumab, docetaxel. Radiation completed 2017. Stable bone lesions to the Rt scapula, Lt iliac crest, L4, Tspine. Also probable in the liver and lungs. Pt is also bipolar and a recovered drug and alchohol user.  Current treatment TDM-1, denosumab and anastrozole.    Limitations  Other (comment)    Patient Stated Goals  To get more stability back in legs    Currently in Pain?  No/denies    Pain Score  0-No pain              [facility] Adult PT Treatment/Exercise - 09/28/19 1041      Transfers   Transfers  Sit to Stand;Stand to Sit    Sit to Stand  5: Supervision;With upper extremity assist;From bed;From chair/3-in-1    Stand to Sit  5: Supervision;With upper extremity assist;To chair/3-in-1      Ambulation/Gait   Ambulation/Gait  Yes    Ambulation/Gait Assistance  5: Supervision;4: Min guard    Ambulation/Gait  Assistance Details  cues for position and to negotiate around objects    Ambulation Distance (Feet)  250 Feet   x1, plus around gym with activity   Assistive device  Rollator    Gait Pattern  Step-through pattern;Decreased arm swing - right;Decreased arm swing - left;Decreased step length - right;Decreased step length - left;Shuffle;Decreased dorsiflexion - right;Decreased dorsiflexion - left;Poor foot clearance - left;Poor foot clearance - right;Decreased trunk rotation    Ambulation Surface  Level;Indoor      High Level Balance   High Level Balance Activities  Negotitating around obstacles    High Level Balance Comments  weaving around 3 stools along ~20 foot pathway- 4-5 laps with min assist to maneuver around objects  Balance Exercises - 09/28/19 1049      OTAGO PROGRAM   Knee Bends  10 reps, support    Backwards Walking  Support    Sideways Walking  Assistive device    Tandem Stance  10 seconds, support    One Leg Stand  10 seconds, support    Overall OTAGO Comments  use of counter for balance, cues on form and technique needed          PT Short Term Goals - 09/18/19 0810      PT SHORT TERM GOAL #1   Title  Pt will be independent with HEP to address balance, strength, and gait.  TARGET 3 weeks (09/22/2019)    Baseline  Initiated OTAGO HEP, with pt needing cues for correct technique of exercises    Time  3    Period  Weeks    Status  Partially Met    Target Date  09/22/19      PT SHORT TERM GOAL #2   Title  Pt will improve TUG score to less than or equal to 13.5 seconds for decreased fall risk.    Baseline  TUG score 17.19 seconds (>13.5 seconds indicates increased fall risk) at eval; 13.03 sec at best 09/18/2019    Time  3    Period  Weeks    Status  Achieved    Target Date  09/22/19      PT SHORT TERM GOAL #3   Title  Pt will verbalize fall prevention in home envrionment.    Baseline  At fall risk per TUG, DGI scores    Time  3    Period  Weeks     Status  Achieved    Target Date  09/22/19        PT Long Term Goals - 09/18/19 1523      PT LONG TERM GOAL #1   Title  Pt will be independent with progression of balance exercise program for decreased fall risk.  TARGET 10/20/2019    Baseline  No formal balance HEP    Time  7    Period  Weeks    Status  On-going      PT LONG TERM GOAL #2   Title  Pt will improve DGI score to at least 16/24 for decreased fall risk.    Baseline  DGI score 11/24 (Scores <19/24 indicate increased fall risk)    Time  7    Status  On-going      PT LONG TERM GOAL #3   Title  Pt will improve gait velocity to at least 2.3 ft/sec for improved gait efficiency and safety.    Baseline  Gait velocity 1.95 ft/sec (1.31-2.62 f/tsec indicates limited community ambulator)    Time  7    Period  Weeks    Status  On-going      PT LONG TERM GOAL #4   Title  Pt will negotiate 1 curb step to simulate step up into home, no rail, independentlyf or improved safety with curbs/home entry.    Baseline  Pt reports one near fall with curb step; unable to perform stepping over obstacle without stopping, SLS <1 second each leg    Time  7    Period  Weeks    Status  On-going            Plan - 09/28/19 1026    Clinical Impression Statement  Today's skilled session focused on use of rollator with gait for  increased safety and decreased fall risk. Pt needs cues to negotiate around objects/walls as she runs into them. The pt is progressing slowly, should benefit from continued PT to progress toward unmet goals. Primary PT made aware of fall and over to assess pt at start of session.    Personal Factors and Comorbidities  Comorbidity 3+    Comorbidities  See PMH (subjective); metastatic breast cancer, hx of bipolar and anxiety    Examination-Activity Limitations  Stairs   Curbs   Examination-Participation Restrictions  --   Work   Stability/Clinical Decision Making  Stable/Uncomplicated    Rehab Potential  Good    PT  Frequency  Other (comment)   1x/wk for 3 weeks, then 2x/wk for 4 weeks   PT Duration  --   7 weeks total POC   PT Treatment/Interventions  ADLs/Self Care Home Management;Neuromuscular re-education;Gait training;Stair training;Patient/family education;Therapeutic activities;Functional mobility training;Therapeutic exercise;Balance training;DME Instruction    PT Next Visit Plan  ; gait training (rollator versus RW) for improved foot clearance; curb and step negotiation;functional strengtheninga nd balance; check on rollator walker order from NP    Consulted and Agree with Plan of Care  Patient       Patient will benefit from skilled therapeutic intervention in order to improve the following deficits and impairments:  Abnormal gait, Difficulty walking, Decreased mobility, Decreased strength, Decreased balance  Visit Diagnosis: Unsteadiness on feet  Muscle weakness (generalized)  Other abnormalities of gait and mobility     Problem List Patient Active Problem List   Diagnosis Date Noted  . Thrombocytopenia ([place]) 08/09/2019  . Port-A-Cath in place 06/20/2019  . 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[facility]) 03/30/2019  . S/P mastectomy, left 12/09/2018  . S/P breast reconstruction, left 12/09/2018  . S/P radiation therapy 12/09/2018  . GERD (gastroesophageal reflux disease) 12/10/2017  . Edema 11/09/2017  . Sensorineural hearing loss (SNHL) of both ears 11/05/2017  . Vitamin D deficiency 01/18/2017  . Bipolar I disorder, most recent episode depressed ([place]) 12/08/2016  . Adjustment disorder with anxiety 12/06/2016  . Metastatic breast cancer ([place])   . History of cancer metastatic to brain 07/27/2016  . Iron deficiency anemia 06/26/2016  . Bone metastases ([place]) 06/03/2016  . Primary cancer of lower-inner quadrant of left female breast ([place]) 06/01/2016  . Current smoker 03/28/2015  . Seasonal allergies 03/28/2015  . Anxiety state 02/28/2015  . Fibromyalgia 02/28/2015   . Family history of diabetes mellitus 02/28/2015  . H/O alcohol abuse     [place], [place], [facility] [address] [place], [place] [address] [phone] 09/29/19, 8:54 AM   Name: Delayza Lungren MRN: [id number] Date of Birth: 21-May-1961

## 2019-09-29 NOTE — Telephone Encounter (Signed)
Patient last platelet count was 42.  Has nursing called and spoke with patient?  I am concerned that she will need to be evaluated due to frequent head injuries and thrombocytopenia.

## 2019-09-29 NOTE — Telephone Encounter (Signed)
Spoke with pt by phone regarding her recent falls.  Pt verbalizes that she did fall x 2 and has a bruised eye. Pt states no blurred vision, h/a, slurred speech, dizziness or difficulty walking. Pt states she has been applying ice to eye and "I know I will have a black eye". Pt declines to come in for office visit at this time. Pt instructed to go to ED immediately if she develops any of the above mentioned symptoms.  Pt verbalizes understanding and states she will call us back on Monday to let us know how she is doing.

## 2019-10-02 ENCOUNTER — Other Ambulatory Visit: Payer: Self-pay

## 2019-10-02 ENCOUNTER — Ambulatory Visit: Payer: Medicaid Other | Attending: Adult Health | Admitting: Physical Therapy

## 2019-10-02 ENCOUNTER — Encounter: Payer: Self-pay | Admitting: Physical Therapy

## 2019-10-02 VITALS — BP 112/72

## 2019-10-02 DIAGNOSIS — M6281 Muscle weakness (generalized): Secondary | ICD-10-CM | POA: Diagnosis present

## 2019-10-02 DIAGNOSIS — R2681 Unsteadiness on feet: Secondary | ICD-10-CM | POA: Diagnosis present

## 2019-10-02 DIAGNOSIS — R2689 Other abnormalities of gait and mobility: Secondary | ICD-10-CM | POA: Insufficient documentation

## 2019-10-02 DIAGNOSIS — R293 Abnormal posture: Secondary | ICD-10-CM | POA: Diagnosis present

## 2019-10-02 NOTE — Therapy (Signed)
[facility] [address] [place], [place], [address] Phone: [phone]   Fax:  [phone]  Physical Therapy Treatment  Patient Details  Name: [name] MRN: [id number] Date of Birth: 1967/09/17 Referring Provider (PT): Wilber Bihari, NP   Encounter Date: 10/02/2019  PT End of Session - 10/02/19 1024    Visit Number  7    Number of Visits  12    Date for PT Re-Evaluation  11/20/19    Authorization Type  Medicaid (per email from Antony Salmon and Trudee Kuster 08/22/2019, >1 medicaid eval is allowed in a year);    Authorization Time Period  08/31/2019-09/20/2019; 09/28/19-10/25/19 (8 visits)    Authorization - Visit Number  3    Authorization - Number of Visits  8    PT Start Time  0848    PT Stop Time  0930    PT Time Calculation (min)  42 min    Equipment Utilized During Treatment  Gait belt    Activity Tolerance  Patient tolerated treatment well;Patient limited by fatigue   Needs repeated sitting breaks during standing exercise   Behavior During Therapy  [facility] for tasks assessed/performed   decreased safety awareness; needs repeated cues for braking technique, hand placement with transfers      Past Medical History:  Diagnosis Date  . Alcohol abuse   . Anemia    during chemo  . Anxiety    At age 9  . Arthritis Dx 2010  . Bipolar disorder ([place])   . Bronchitis   . Cancer ([place])    breast mets to brain  . Chronic pain   . Complication of anesthesia   . Depression   . Family history of adverse reaction to anesthesia    MOther had PONV  . Fibromyalgia Dx 2005  . GERD (gastroesophageal reflux disease)   . Headache    hx  migraines  . Lymphedema of left arm   . Opiate dependence ([place])   . PONV (postoperative nausea and vomiting)   . Port-A-Cath in place   . PTSD (post-traumatic stress disorder)     Past Surgical History:  Procedure Laterality Date  . APPLICATION OF CRANIAL NAVIGATION N/A 08/14/2016   Procedure: APPLICATION OF CRANIAL NAVIGATION;  Surgeon: Erline Levine, MD;  Location: [facility];  Service: Neurosurgery;  Laterality: N/A;  . BREAST RECONSTRUCTION Left    with silicone implant  . COLONOSCOPY W/ POLYPECTOMY    . CRANIOTOMY N/A 08/14/2016   Procedure: CRANIOTOMY TUMOR EXCISION WITH Lucky Rathke;  Surgeon: Erline Levine, MD;  Location: [facility];  Service: Neurosurgery;  Laterality: N/A;  . FIBULA FRACTURE SURGERY Left   . MASTECTOMY Left   . RADIOLOGY WITH ANESTHESIA N/A 07/23/2016   Procedure: MRI OF BRAIN WITH AND WITHOUT;  Surgeon: Medication Radiologist, MD;  Location: [facility];  Service: Radiology;  Laterality: N/A;  . RADIOLOGY WITH ANESTHESIA N/A 09/08/2016   Procedure: MRI OF BRAIN WITH AND WITHOUT CONTRAST;  Surgeon: Medication Radiologist, MD;  Location: [facility];  Service: Radiology;  Laterality: N/A;  . RADIOLOGY WITH ANESTHESIA N/A 12/10/2016   Procedure: MRI OF BRAIN WITH AND WITHOUT;  Surgeon: Medication Radiologist, MD;  Location: [facility];  Service: Radiology;  Laterality: N/A;  . RADIOLOGY WITH ANESTHESIA N/A 03/02/2017   Procedure: MRI of BRAIN W and W/OUT CONTRAST;  Surgeon: Medication Radiologist, MD;  Location: [facility];  Service: Radiology;  Laterality: N/A;  . RADIOLOGY WITH ANESTHESIA N/A 07/29/2017   Procedure: RADIOLOGY WITH  ANESTHESIA MRI OF BRAIN WITH AND WITHOUT CONTRAST;  Surgeon: Radiologist, Medication, MD;  Location: [facility];  Service: Radiology;  Laterality: N/A;  . RADIOLOGY WITH ANESTHESIA N/A 12/07/2017   Procedure: MRI WITH ANESTHESIA OF BRAIN WITH AND WITHOUT CONTRAST;  Surgeon: Radiologist, Medication, MD;  Location: [facility];  Service: Radiology;  Laterality: N/A;  . RADIOLOGY WITH ANESTHESIA N/A 04/07/2018   Procedure: MRI OF BRAIN WITH AND WITHOUT CONTRAST;  Surgeon: Radiologist, Medication, MD;  Location: [facility];  Service: Radiology;  Laterality: N/A;  . RADIOLOGY WITH ANESTHESIA N/A 08/23/2018   Procedure: MRI WITH ANESTHESIA OF THE BRAIN WITH AND WITHOUT;   Surgeon: Radiologist, Medication, MD;  Location: [facility];  Service: Radiology;  Laterality: N/A;  . RADIOLOGY WITH ANESTHESIA N/A 01/24/2019   Procedure: MRI OF BRAIN WITH AND WITHOUT CONTRAST;  Surgeon: Radiologist, Medication, MD;  Location: [facility];  Service: Radiology;  Laterality: N/A;  . RADIOLOGY WITH ANESTHESIA N/A 07/06/2019   Procedure: MRI WITH ANESTHESIA OF BRAIN WITH AND WITHOUT CONTRAST;  Surgeon: Radiologist, Medication, MD;  Location: [facility];  Service: Radiology;  Laterality: N/A;  . right power port placement Right     Vitals:   10/02/19 0913 10/02/19 0914  BP: 110/80 112/72    Subjective Assessment - 10/02/19 0850    Subjective  No more falls over the weekend.  Got the rollator, but did not use over the weekend.  Not sure how to lock the brakes.    Pertinent History  Per [employer] lymphedema eval 07/2019:  Treated here in March of last year currently with a reid sleeve, day sleeve and compression pump. Lt mastectomy and ALND with reconstruction 07/08/15 for T4N2 stage IIIB IDC. Metastatic disease in the brain s/p craniotomy 08/14/16. Chemo completed in 2017 Trastuzumab and Pertuzumab, docetaxel. Radiation completed 2017. Stable bone lesions to the Rt scapula, Lt iliac crest, L4, Tspine. Also probable in the liver and lungs. Pt is also bipolar and a recovered drug and alchohol user.  Current treatment TDM-1, denosumab and anastrozole.    Limitations  Other (comment)    Patient Stated Goals  To get more stability back in legs    Currently in Pain?  No/denies    Pain Onset  In the past 7 days                       [facility] Adult PT Treatment/Exercise - 10/02/19 0001      Transfers   Transfers  Sit to Stand;Stand to Sit    Sit to Stand  5: Supervision;4: Min guard;With upper extremity assist;From chair/3-in-1;From bed    Sit to Stand Details (indicate cue type and reason)  Cues for hand placement, correct sequence to lock brakes; despite cues, pt verbalizes  understanding, but keeps hands on rollator handles for sit>stand    Stand to Sit  5: Supervision;4: Min guard;With upper extremity assist;To chair/3-in-1;To bed    Stand to Sit Details  Cues for hand placement, to lock brakes in correct sequence    Comments  Practiced x 1, locking brakes, turning to sit at rollator seat; then stand from rollator seat and unlock brakes to start gait again.      Ambulation/Gait   Ambulation/Gait  Yes    Ambulation/Gait Assistance  5: Supervision;4: Min guard    Ambulation/Gait Assistance Details  Pt brings in her rollator today.  Explained and had pt use brakes correctly throughout session.    Ambulation Distance (Feet)  350 Feet  115 x 2   Assistive device  Rollator    Gait Pattern  Step-through pattern;Decreased arm swing - right;Decreased arm swing - left;Decreased step length - right;Decreased step length - left;Shuffle;Decreased dorsiflexion - right;Decreased dorsiflexion - left;Poor foot clearance - left;Poor foot clearance - right;Decreased trunk rotation    Ambulation Surface  Level;Indoor    Gait Comments  Additional 200 ft of gait with turns to negotiate around furniture, then figure-8s, 2 sets x 2 reps around cones with rollator (hits cones each time), needing manual cues to widen turns to avoid cones.          Balance Exercises - 10/02/19 0909      Balance Exercises: Standing   Wall Bumps  Hip    Wall Bumps-Hips  Eyes opened;Anterior/posterior;10 reps      OTAGO PROGRAM   Knee Extensor  10 reps   2 sets   Knee Flexor  10 reps   2 sets    Hip ABductor  10 reps   2 sets, 2nd set alternating legs   Ankle Plantorflexors  20 reps, support    Ankle Dorsiflexors  20 reps, support    Knee Bends  10 reps, support     Pt requests to sit several times:  Feeling dizzy (see BP measures above), then feeling like legs are wobbly/weak.   PT Education - 10/02/19 1018    Education Details  Instructed in use of brakes correctly for transfers, for  use of rollator seat for seated breaks with gait (Pt needs repeated cues); PT asked if roommate could come in for carryover of PT recommendations    Person(s) Educated  Patient    Methods  Explanation;Demonstration;Verbal cues    Comprehension  Verbalized understanding;Verbal cues required;Tactile cues required;Need further instruction       PT Short Term Goals - 09/18/19 0810      PT SHORT TERM GOAL #1   Title  Pt will be independent with HEP to address balance, strength, and gait.  TARGET 3 weeks (09/22/2019)    Baseline  Initiated OTAGO HEP, with pt needing cues for correct technique of exercises    Time  3    Period  Weeks    Status  Partially Met    Target Date  09/22/19      PT SHORT TERM GOAL #2   Title  Pt will improve TUG score to less than or equal to 13.5 seconds for decreased fall risk.    Baseline  TUG score 17.19 seconds (>13.5 seconds indicates increased fall risk) at eval; 13.03 sec at best 09/18/2019    Time  3    Period  Weeks    Status  Achieved    Target Date  09/22/19      PT SHORT TERM GOAL #3   Title  Pt will verbalize fall prevention in home envrionment.    Baseline  At fall risk per TUG, DGI scores    Time  3    Period  Weeks    Status  Achieved    Target Date  09/22/19        PT Long Term Goals - 09/18/19 1523      PT LONG TERM GOAL #1   Title  Pt will be independent with progression of balance exercise program for decreased fall risk.  TARGET 10/20/2019    Baseline  No formal balance HEP    Time  7    Period  Weeks    Status  On-going  PT LONG TERM GOAL #2   Title  Pt will improve DGI score to at least 16/24 for decreased fall risk.    Baseline  DGI score 11/24 (Scores <19/24 indicate increased fall risk)    Time  7    Status  On-going      PT LONG TERM GOAL #3   Title  Pt will improve gait velocity to at least 2.3 ft/sec for improved gait efficiency and safety.    Baseline  Gait velocity 1.95 ft/sec (1.31-2.62 f/tsec indicates  limited community ambulator)    Time  7    Period  Weeks    Status  On-going      PT LONG TERM GOAL #4   Title  Pt will negotiate 1 curb step to simulate step up into home, no rail, independentlyf or improved safety with curbs/home entry.    Baseline  Pt reports one near fall with curb step; unable to perform stepping over obstacle without stopping, SLS <1 second each leg    Time  7    Period  Weeks    Status  On-going            Plan - 10/02/19 1025    Clinical Impression Statement  Pt comes to therapy session today with her personal rollator walker.  Focused time today on education of correct technique for use of brakes for transfers, how to safely use brakes in order to use seat on rollator.  Pt needs repeated cues for correct hand placement, correct sequence to lock brakes, for optimal safety during transfers.  Asked pt to invite roommate in for additional education for carryover with safety with use of rollator.    Personal Factors and Comorbidities  Comorbidity 3+    Comorbidities  See PMH (subjective); metastatic breast cancer, hx of bipolar and anxiety    Examination-Activity Limitations  Stairs   Curbs   Examination-Participation Restrictions  --   Work   Stability/Clinical Decision Making  Stable/Uncomplicated    Rehab Potential  Good    PT Frequency  Other (comment)   1x/wk for 3 weeks, then 2x/wk for 4 weeks   PT Duration  --   7 weeks total POC   PT Treatment/Interventions  ADLs/Self Care Home Management;Neuromuscular re-education;Gait training;Stair training;Patient/family education;Therapeutic activities;Functional mobility training;Therapeutic exercise;Balance training;DME Instruction    PT Next Visit Plan  Review [place] as needed and encourage consistent performance with HEP; gait training with rollator; hip/ankle/step strategies, balance training    Consulted and Agree with Plan of Care  Patient       Patient will benefit from skilled therapeutic intervention  in order to improve the following deficits and impairments:  Abnormal gait, Difficulty walking, Decreased mobility, Decreased strength, Decreased balance  Visit Diagnosis: Other abnormalities of gait and mobility  Unsteadiness on feet  Muscle weakness (generalized)     Problem List Patient Active Problem List   Diagnosis Date Noted  . Thrombocytopenia ([place]) 08/09/2019  . Port-A-Cath in place 06/20/2019  . 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 S/P mastectomy, left 12/09/2018  . S/P breast reconstruction, left 12/09/2018  . S/P radiation therapy 12/09/2018  . GERD (gastroesophageal reflux disease) 12/10/2017  . Edema 11/09/2017  . Sensorineural hearing loss (SNHL) of both ears 11/05/2017  . Vitamin D deficiency 01/18/2017  . Bipolar I disorder, most recent episode depressed ([place]) 12/08/2016  . Adjustment disorder with anxiety 12/06/2016  . Metastatic breast cancer ([place])   . History of cancer metastatic  to brain 07/27/2016  . Iron deficiency anemia 06/26/2016  . Bone metastases ([place]) 06/03/2016  . Primary cancer of lower-inner quadrant of left female breast ([place]) 06/01/2016  . Current smoker 03/28/2015  . Seasonal allergies 03/28/2015  . Anxiety state 02/28/2015  . Fibromyalgia 02/28/2015  . Family history of diabetes mellitus 02/28/2015  . H/O alcohol abuse     Tanay Massiah W. 10/02/2019, 10:32 AM  Frazier Butt., PT   [facility] [address] [place], [place], [address] Phone: [phone]   Fax:  [phone]  Name: Analicia Skibinski MRN: [id number] Date of Birth: 04/28/1961

## 2019-10-03 NOTE — Telephone Encounter (Signed)
Spoke with pt by phone on 10/30.  She was asymptomatic at the time and icing her eye.  We reviewed symptoms to look out for and I told her to go straight to ED if any symptoms developed.  She agreed.  She had PT again yesterday.  I just called her back to check on her but got VM.

## 2019-10-04 ENCOUNTER — Ambulatory Visit: Payer: Medicaid Other | Admitting: Physical Therapy

## 2019-10-04 ENCOUNTER — Encounter: Payer: Self-pay | Admitting: Physical Therapy

## 2019-10-04 ENCOUNTER — Other Ambulatory Visit: Payer: Self-pay

## 2019-10-04 DIAGNOSIS — M6281 Muscle weakness (generalized): Secondary | ICD-10-CM

## 2019-10-04 DIAGNOSIS — R2689 Other abnormalities of gait and mobility: Secondary | ICD-10-CM | POA: Diagnosis not present

## 2019-10-04 DIAGNOSIS — R2681 Unsteadiness on feet: Secondary | ICD-10-CM

## 2019-10-04 DIAGNOSIS — R293 Abnormal posture: Secondary | ICD-10-CM

## 2019-10-05 ENCOUNTER — Other Ambulatory Visit: Payer: Self-pay | Admitting: Family Medicine

## 2019-10-05 DIAGNOSIS — K219 Gastro-esophageal reflux disease without esophagitis: Secondary | ICD-10-CM

## 2019-10-05 NOTE — Therapy (Signed)
[facility] [address] [place], [place], [address] Phone: [phone]   Fax:  [phone]  Physical Therapy Treatment  Patient Details  Name: [name] MRN: [id number] Date of Birth: Mar 20, 1961 Referring Provider (PT): Wilber Bihari, NP   Encounter Date: 10/04/2019  PT End of Session - 10/04/19 0852    Visit Number  8    Number of Visits  12    Date for PT Re-Evaluation  11/20/19    Authorization Type  Medicaid (per email from Antony Salmon and Trudee Kuster 08/22/2019, >1 medicaid eval is allowed in a year);    Authorization Time Period  08/31/2019-09/20/2019; 09/28/19-10/25/19 (8 visits)    Authorization - Visit Number  4    Authorization - Number of Visits  8    PT Start Time  8144    PT Stop Time  0918   left early due to illness   PT Time Calculation (min)  28 min    Equipment Utilized During Treatment  Gait belt    Activity Tolerance  Patient tolerated treatment well;Treatment limited secondary to medical complications (Comment);Other (comment)   pt with nausea and vomiting from am meds   Behavior During Therapy  [facility] for tasks assessed/performed   decreased safety awareness; needs repeated cues for braking technique, hand placement with transfers      Past Medical History:  Diagnosis Date  . Alcohol abuse   . Anemia    during chemo  . Anxiety    At age 2  . Arthritis Dx 2010  . Bipolar disorder ([place])   . Bronchitis   . Cancer ([place])    breast mets to brain  . Chronic pain   . Complication of anesthesia   . Depression   . Family history of adverse reaction to anesthesia    MOther had PONV  . Fibromyalgia Dx 2005  . GERD (gastroesophageal reflux disease)   . Headache    hx  migraines  . Lymphedema of left arm   . Opiate dependence ([place])   . PONV (postoperative nausea and vomiting)   . Port-A-Cath in place   . PTSD (post-traumatic stress disorder)     Past Surgical History:  Procedure  Laterality Date  . APPLICATION OF CRANIAL NAVIGATION N/A 08/14/2016   Procedure: APPLICATION OF CRANIAL NAVIGATION;  Surgeon: Erline Levine, MD;  Location: [place];  Service: Neurosurgery;  Laterality: N/A;  . BREAST RECONSTRUCTION Left    with silicone implant  . COLONOSCOPY W/ POLYPECTOMY    . CRANIOTOMY N/A 08/14/2016   Procedure: CRANIOTOMY TUMOR EXCISION WITH Lucky Rathke;  Surgeon: Erline Levine, MD;  Location: [place];  Service: Neurosurgery;  Laterality: N/A;  . FIBULA FRACTURE SURGERY Left   . MASTECTOMY Left   . RADIOLOGY WITH ANESTHESIA N/A 07/23/2016   Procedure: MRI OF BRAIN WITH AND WITHOUT;  Surgeon: Medication Radiologist, MD;  Location: [facility];  Service: Radiology;  Laterality: N/A;  . RADIOLOGY WITH ANESTHESIA N/A 09/08/2016   Procedure: MRI OF BRAIN WITH AND WITHOUT CONTRAST;  Surgeon: Medication Radiologist, MD;  Location: [facility];  Service: Radiology;  Laterality: N/A;  . RADIOLOGY WITH ANESTHESIA N/A 12/10/2016   Procedure: MRI OF BRAIN WITH AND WITHOUT;  Surgeon: Medication Radiologist, MD;  Location: [facility];  Service: Radiology;  Laterality: N/A;  . RADIOLOGY WITH ANESTHESIA N/A 03/02/2017   Procedure: MRI of BRAIN W and W/OUT CONTRAST;  Surgeon: Medication Radiologist, MD;  Location: [facility];  Service: Radiology;  Laterality: N/A;  .  RADIOLOGY WITH ANESTHESIA N/A 07/29/2017   Procedure: RADIOLOGY WITH ANESTHESIA MRI OF BRAIN WITH AND WITHOUT CONTRAST;  Surgeon: Radiologist, Medication, MD;  Location: [facility];  Service: Radiology;  Laterality: N/A;  . RADIOLOGY WITH ANESTHESIA N/A 12/07/2017   Procedure: MRI WITH ANESTHESIA OF BRAIN WITH AND WITHOUT CONTRAST;  Surgeon: Radiologist, Medication, MD;  Location: [facility];  Service: Radiology;  Laterality: N/A;  . RADIOLOGY WITH ANESTHESIA N/A 04/07/2018   Procedure: MRI OF BRAIN WITH AND WITHOUT CONTRAST;  Surgeon: Radiologist, Medication, MD;  Location: [facility];  Service: Radiology;  Laterality: N/A;  . RADIOLOGY WITH ANESTHESIA N/A  08/23/2018   Procedure: MRI WITH ANESTHESIA OF THE BRAIN WITH AND WITHOUT;  Surgeon: Radiologist, Medication, MD;  Location: [facility];  Service: Radiology;  Laterality: N/A;  . RADIOLOGY WITH ANESTHESIA N/A 01/24/2019   Procedure: MRI OF BRAIN WITH AND WITHOUT CONTRAST;  Surgeon: Radiologist, Medication, MD;  Location: [facility];  Service: Radiology;  Laterality: N/A;  . RADIOLOGY WITH ANESTHESIA N/A 07/06/2019   Procedure: MRI WITH ANESTHESIA OF BRAIN WITH AND WITHOUT CONTRAST;  Surgeon: Radiologist, Medication, MD;  Location: [facility];  Service: Radiology;  Laterality: N/A;  . right power port placement Right     There were no vitals filed for this visit.  Subjective Assessment - 10/04/19 0852    Subjective  No new complaints. No falls or pain. Has rollator, did not use it to come to therapy today.    Pertinent History  Per [employer] lymphedema eval 07/2019:  Treated here in March of last year currently with a reid sleeve, day sleeve and compression pump. Lt mastectomy and ALND with reconstruction 07/08/15 for T4N2 stage IIIB IDC. Metastatic disease in the brain s/p craniotomy 08/14/16. Chemo completed in 2017 Trastuzumab and Pertuzumab, docetaxel. Radiation completed 2017. Stable bone lesions to the Rt scapula, Lt iliac crest, L4, Tspine. Also probable in the liver and lungs. Pt is also bipolar and a recovered drug and alchohol user.  Current treatment TDM-1, denosumab and anastrozole.    Patient Stated Goals  To get more stability back in legs    Currently in Pain?  No/denies    Pain Score  0-No pain           [facility] Adult PT Treatment/Exercise - 10/04/19 0853      Transfers   Transfers  Sit to Stand;Stand to Sit    Sit to Stand  5: Supervision;4: Min guard;With upper extremity assist;From chair/3-in-1;From bed    Sit to Stand Details  Verbal cues for sequencing;Verbal cues for technique    Sit to Stand Details (indicate cue type and reason)  cue for proper use of brakes on rollator with transfers     Stand to Sit  5: Supervision;4: Min guard;With upper extremity assist;To chair/3-in-1;To bed    Stand to Sit Details (indicate cue type and reason)  Verbal cues for sequencing;Verbal cues for technique;Verbal cues for precautions/safety    Stand to Sit Details  cues to lock brakes prior to sitting and to keep rollator with her when turning/backing up to sit down      Ambulation/Gait   Ambulation/Gait  Yes    Ambulation/Gait Assistance  5: Supervision;4: Min guard    Ambulation/Gait Assistance Details  cues for hand placement on brakes with gait, use of brakes with inclines/declines and rollator position wtih gait.     Ambulation Distance (Feet)  500 Feet   x1, plus around gym   Assistive device  Rollator  Gait Pattern  Step-through pattern;Decreased arm swing - right;Decreased arm swing - left;Decreased step length - right;Decreased step length - left;Shuffle;Decreased dorsiflexion - right;Decreased dorsiflexion - left;Poor foot clearance - left;Poor foot clearance - right;Decreased trunk rotation    Ambulation Surface  Level;Indoor;Unlevel;Outdoor;Paved;Grass      Knee/Hip Exercises: Aerobic   Other Aerobic  Scifit UE/LE level 1.5 with goal >/=25 rpm for 3 minutes of work, then pt reporting feeling sick on her stomach from her am meds and not taking her Zofran. Assisted pt to restroom with rollator.             PT Short Term Goals - 09/18/19 0810      PT SHORT TERM GOAL #1   Title  Pt will be independent with HEP to address balance, strength, and gait.  TARGET 3 weeks (09/22/2019)    Baseline  Initiated [place] HEP, with pt needing cues for correct technique of exercises    Time  3    Period  Weeks    Status  Partially Met    Target Date  09/22/19      PT SHORT TERM GOAL #2   Title  Pt will improve TUG score to less than or equal to 13.5 seconds for decreased fall risk.    Baseline  TUG score 17.19 seconds (>13.5 seconds indicates increased fall risk) at eval; 13.03 sec at best  09/18/2019    Time  3    Period  Weeks    Status  Achieved    Target Date  09/22/19      PT SHORT TERM GOAL #3   Title  Pt will verbalize fall prevention in home envrionment.    Baseline  At fall risk per TUG, DGI scores    Time  3    Period  Weeks    Status  Achieved    Target Date  09/22/19        PT Long Term Goals - 09/18/19 1523      PT LONG TERM GOAL #1   Title  Pt will be independent with progression of balance exercise program for decreased fall risk.  TARGET 10/20/2019    Baseline  No formal balance HEP    Time  7    Period  Weeks    Status  On-going      PT LONG TERM GOAL #2   Title  Pt will improve DGI score to at least 16/24 for decreased fall risk.    Baseline  DGI score 11/24 (Scores <19/24 indicate increased fall risk)    Time  7    Status  On-going      PT LONG TERM GOAL #3   Title  Pt will improve gait velocity to at least 2.3 ft/sec for improved gait efficiency and safety.    Baseline  Gait velocity 1.95 ft/sec (1.31-2.62 f/tsec indicates limited community ambulator)    Time  7    Period  Weeks    Status  On-going      PT LONG TERM GOAL #4   Title  Pt will negotiate 1 curb step to simulate step up into home, no rail, independentlyf or improved safety with curbs/home entry.    Baseline  Pt reports one near fall with curb step; unable to perform stepping over obstacle without stopping, SLS <1 second each leg    Time  7    Period  Weeks    Status  On-going  Plan - 10/04/19 0853    Clinical Impression Statement  Today's skilled session continued to focus on use of rollator with gait (use of clinic rollator due to pt did not bring hers). Continues to need cues for general safety with mobility and gait. Began to work on strengthening and activity tolerance with pt becoming nauseated on Scifit. Assisted pt to restroom with session ending after this so that she can go get her Zofran from pharmacy. Reports her med's make her sick so she usually  takes Zofran with them to not get sick and did not take it this morning as she was out. Pt escorted to car with clinic rollator where boyfriend stated they would go now for her meds. He was also informed she should be using the rollator with all gait for balance and safety. Pt should benefit from continued PT to progress toward unmet goals.    Personal Factors and Comorbidities  Comorbidity 3+    Comorbidities  See PMH (subjective); metastatic breast cancer, hx of bipolar and anxiety    Examination-Activity Limitations  Stairs   Curbs   Examination-Participation Restrictions  --   Work   Stability/Clinical Decision Making  Stable/Uncomplicated    Rehab Potential  Good    PT Frequency  Other (comment)   1x/wk for 3 weeks, then 2x/wk for 4 weeks   PT Duration  --   7 weeks total POC   PT Treatment/Interventions  ADLs/Self Care Home Management;Neuromuscular re-education;Gait training;Stair training;Patient/family education;Therapeutic activities;Functional mobility training;Therapeutic exercise;Balance training;DME Instruction    PT Next Visit Plan  Review [place] as needed and encourage consistent performance with HEP; gait training with rollator; hip/ankle/step strategies, balance training    Consulted and Agree with Plan of Care  Patient       Patient will benefit from skilled therapeutic intervention in order to improve the following deficits and impairments:  Abnormal gait, Difficulty walking, Decreased mobility, Decreased strength, Decreased balance  Visit Diagnosis: Other abnormalities of gait and mobility  Unsteadiness on feet  Muscle weakness (generalized)  Abnormal posture     Problem List Patient Active Problem List   Diagnosis Date Noted  . Thrombocytopenia ([place]) 08/09/2019  . Port-A-Cath in place 06/20/2019  . 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 S/P mastectomy, left 12/09/2018  . S/P breast reconstruction, left 12/09/2018  . S/P  radiation therapy 12/09/2018  . GERD (gastroesophageal reflux disease) 12/10/2017  . Edema 11/09/2017  . Sensorineural hearing loss (SNHL) of both ears 11/05/2017  . Vitamin D deficiency 01/18/2017  . Bipolar I disorder, most recent episode depressed ([place]) 12/08/2016  . Adjustment disorder with anxiety 12/06/2016  . Metastatic breast cancer ([place])   . History of cancer metastatic to brain 07/27/2016  . Iron deficiency anemia 06/26/2016  . Bone metastases ([place]) 06/03/2016  . Primary cancer of lower-inner quadrant of left female breast ([place]) 06/01/2016  . Current smoker 03/28/2015  . Seasonal allergies 03/28/2015  . Anxiety state 02/28/2015  . Fibromyalgia 02/28/2015  . Family history of diabetes mellitus 02/28/2015  . H/O alcohol abuse     [place], [place], [facility] [address], [place], [place] [address] [phone] 10/05/19, 11:25 AM   Name: [name] MRN: [id number] Date of Birth: 05/22/1961

## 2019-10-10 ENCOUNTER — Ambulatory Visit: Payer: Medicaid Other | Admitting: Physical Therapy

## 2019-10-11 ENCOUNTER — Inpatient Hospital Stay (HOSPITAL_BASED_OUTPATIENT_CLINIC_OR_DEPARTMENT_OTHER): Payer: Medicaid Other | Admitting: Adult Health

## 2019-10-11 ENCOUNTER — Encounter: Payer: Self-pay | Admitting: Adult Health

## 2019-10-11 ENCOUNTER — Inpatient Hospital Stay: Payer: Medicaid Other

## 2019-10-11 ENCOUNTER — Encounter: Payer: Self-pay | Admitting: Oncology

## 2019-10-11 ENCOUNTER — Inpatient Hospital Stay: Payer: Medicaid Other | Attending: Medical

## 2019-10-11 ENCOUNTER — Other Ambulatory Visit: Payer: Self-pay

## 2019-10-11 VITALS — HR 97

## 2019-10-11 VITALS — BP 145/75 | HR 113 | Temp 98.2°F | Resp 17 | Ht 65.0 in | Wt 149.5 lb

## 2019-10-11 DIAGNOSIS — C770 Secondary and unspecified malignant neoplasm of lymph nodes of head, face and neck: Secondary | ICD-10-CM | POA: Diagnosis not present

## 2019-10-11 DIAGNOSIS — C50312 Malignant neoplasm of lower-inner quadrant of left female breast: Secondary | ICD-10-CM | POA: Insufficient documentation

## 2019-10-11 DIAGNOSIS — C7951 Secondary malignant neoplasm of bone: Secondary | ICD-10-CM

## 2019-10-11 DIAGNOSIS — Z5112 Encounter for antineoplastic immunotherapy: Secondary | ICD-10-CM | POA: Insufficient documentation

## 2019-10-11 DIAGNOSIS — C50919 Malignant neoplasm of unspecified site of unspecified female breast: Secondary | ICD-10-CM

## 2019-10-11 DIAGNOSIS — Z95828 Presence of other vascular implants and grafts: Secondary | ICD-10-CM

## 2019-10-11 DIAGNOSIS — F411 Generalized anxiety disorder: Secondary | ICD-10-CM

## 2019-10-11 LAB — CBC WITH DIFFERENTIAL/PLATELET
Abs Immature Granulocytes: 0.02 10*3/uL (ref 0.00–0.07)
Basophils Absolute: 0 10*3/uL (ref 0.0–0.1)
Basophils Relative: 1 %
Eosinophils Absolute: 0 10*3/uL (ref 0.0–0.5)
Eosinophils Relative: 0 %
HCT: 37 % (ref 36.0–46.0)
Hemoglobin: 11.8 g/dL — ABNORMAL LOW (ref 12.0–15.0)
Immature Granulocytes: 1 %
Lymphocytes Relative: 21 %
Lymphs Abs: 0.6 10*3/uL — ABNORMAL LOW (ref 0.7–4.0)
MCH: 28.1 pg (ref 26.0–34.0)
MCHC: 31.9 g/dL (ref 30.0–36.0)
MCV: 88.1 fL (ref 80.0–100.0)
Monocytes Absolute: 0.2 10*3/uL (ref 0.1–1.0)
Monocytes Relative: 6 %
Neutro Abs: 2.2 10*3/uL (ref 1.7–7.7)
Neutrophils Relative %: 71 %
Platelets: 44 10*3/uL — ABNORMAL LOW (ref 150–400)
RBC: 4.2 MIL/uL (ref 3.87–5.11)
RDW: 15.1 % (ref 11.5–15.5)
WBC: 3 10*3/uL — ABNORMAL LOW (ref 4.0–10.5)
nRBC: 0 % (ref 0.0–0.2)

## 2019-10-11 LAB — COMPREHENSIVE METABOLIC PANEL
ALT: 17 U/L (ref 0–44)
AST: 34 U/L (ref 15–41)
Albumin: 3.6 g/dL (ref 3.5–5.0)
Alkaline Phosphatase: 94 U/L (ref 38–126)
Anion gap: 8 (ref 5–15)
BUN: 10 mg/dL (ref 6–20)
CO2: 29 mmol/L (ref 22–32)
Calcium: 9.6 mg/dL (ref 8.9–10.3)
Chloride: 103 mmol/L (ref 98–111)
Creatinine, Ser: 0.92 mg/dL (ref 0.44–1.00)
GFR calc Af Amer: >60 mL/min (ref >60)
GFR calc non Af Amer: >60 mL/min (ref >60)
Glucose, Bld: 186 mg/dL — ABNORMAL HIGH (ref 70–99)
Potassium: 3.3 mmol/L — ABNORMAL LOW (ref 3.5–5.1)
Sodium: 140 mmol/L (ref 135–145)
Total Bilirubin: 0.4 mg/dL (ref 0.3–1.2)
Total Protein: 6.8 g/dL (ref 6.5–8.1)

## 2019-10-11 MED ORDER — ACETAMINOPHEN 325 MG PO TABS
ORAL_TABLET | ORAL | Status: AC
  Filled 2019-10-11: qty 2

## 2019-10-11 MED ORDER — DIPHENHYDRAMINE HCL 25 MG PO CAPS: 25.0000 mg | ORAL_CAPSULE | Freq: Once | ORAL | Status: DC

## 2019-10-11 MED ORDER — ACETAMINOPHEN 325 MG PO TABS
650.0000 mg | ORAL_TABLET | Freq: Once | ORAL | Status: AC
  Administered 2019-10-11: 650 mg via ORAL

## 2019-10-11 MED ORDER — HEPARIN SOD (PORK) LOCK FLUSH 100 UNIT/ML IV SOLN
500.0000 [IU] | Freq: Once | INTRAVENOUS | Status: AC | PRN
  Administered 2019-10-11: 500 [IU]
  Filled 2019-10-11: qty 5

## 2019-10-11 MED ORDER — SODIUM CHLORIDE 0.9% FLUSH
10.0000 mL | INTRAVENOUS | Status: DC | PRN
  Administered 2019-10-11: 10 mL
  Filled 2019-10-11: qty 10

## 2019-10-11 MED ORDER — SODIUM CHLORIDE 0.9 % IV SOLN
Freq: Once | INTRAVENOUS | Status: AC
  Administered 2019-10-11: 11:00:00 via INTRAVENOUS
  Filled 2019-10-11: qty 250

## 2019-10-11 MED ORDER — SODIUM CHLORIDE 0.9 % IV SOLN
260.0000 mg | Freq: Once | INTRAVENOUS | Status: AC
  Administered 2019-10-11: 260 mg via INTRAVENOUS
  Filled 2019-10-11: qty 8

## 2019-10-11 NOTE — Progress Notes (Signed)
Pt states she has an echo scheduled for 10/2019

## 2019-10-11 NOTE — Progress Notes (Signed)
[name]  [phone] [phone]     ID: [name] DOB: 11/02/1961  MR#: [id number]  UXY#:[id number]  Patient Care Team: Charlott Rakes, MD as PCP - General (Family Medicine) Kyung Rudd, MD as Consulting Physician (Radiation Oncology) Erline Levine, MD as Consulting Physician (Neurosurgery) Corena Pilgrim, MD as Consulting Physician (Psychiatry) Mickeal Skinner Acey Lav, MD as Consulting Physician (Psychiatry) Nehemiah Settle, MD as Referring Physician (Plastic Surgery) Irene Limbo, MD as Consulting Physician (Plastic Surgery) Dillingham, Loel Lofty, DO as Attending Physician (Plastic Surgery)   CHIEF COMPLAINT: Metastatic triple positive breast cancer  CURRENT TREATMENT: T-DM1, denosumab, anastrozole  INTERVAL HISTORY: [name] was scheduled for treatment only today but she let me know that she wanted to be seen to discuss some issues going on.  She continues on TDM 1, which generally she tolerates well.    She also continues on denosumab.  She has tolerated this well so far, with no dental issues to her knowledge.  She also continues on anastrozole.  Hot flashes and vaginal dryness are not major issues for her.  REVIEW OF SYSTEMS: [name] is feeling well today.  She did fall 2 weeks ago and hit her face, but has not had any issues since then.  She says she tripped and fell down.  She notes that she called our nurse and was given instructions on what to look out and she hasn't had any issues.  She continues to work with PT on her balance.  [name] denies any new pain, headaches, vision changes, focal weakness, numbness, nausea, vomiting, bowel/bladder changes, easy bleeding, cough, shortness of breath, chest pain, or palpitation.  A detailed ROS was otherwise non contributory.   BREAST CANCER HISTORY: From the original intake note:  [name] was aware of a "lemon sized lump in" her left axilla for about a year before bringing it to medical  attention. By then she had developed left breast and left axillary swelling (June 2016). She presented to the local emergency room and had a chest CT scan 06/06/2015 which showed a nodule in the left breast measuring 0.9 cm and questionable left axillary adenopathy. She then proceeded to bilateral diagnostic mammography and left breast ultrasonography 06/19/2015. There were no prior films for comparison (last mammography 12 years prior).. The breast density was category C. Mammography showed in the left breast upper inner quadrant a 7 cm area including a small mass and significant pleomorphic calcifications. Ultrasonography defined the mass as measuring 1.2 cm. The left axilla appeared unremarkable. There was significant skin edema.  Biopsy of the left breast mass 06/19/2015 showed ([id number]) an invasive ductal carcinoma, grade 2, estrogen receptor 83% positive, progesterone receptor 26% positive, and HER-2 amplified by immunohistochemstry with a 3+ reading. The patient had biopsies of a separate area in the left breast August of the same year and this showed atypical ductal hyperplasia. (SP [id number]).  Accordingly after appropriate discussion on 08/21/2015 the patient proceeded to left mastectomy with left axillary sentinel lymph node sampling, which, since the lymph nodes were positive, extended to the procedure to left axillary lymph node dissection. The pathology (SP [id number]) showed an invasive ductal carcinoma, grade 3, measuring in excess of 9 cm. There were also skin satellites, not contiguous with the invasive carcinoma. Margins were clear and ample. There was evidence of lymphovascular invasion. A total of 15 lymph nodes were removed, including 5 sentinel lymph nodes, all of which were positive, so that the final total was 14 out of 15 lymph nodes  involved by tumor. There was evidence of extranodal extension. The final pathology was pT4b pN2a, stage IIIB  CA-27-29 and CEA 09/19/2015 were  non-informative October 2016.  Unfortunately CT scans of the chest abdomen and pelvis 09/16/2015 showed bony metastases to the ri/ght scapula, left iliac crest, and also L4 and T-spine. There were questionable liver cysts which on repeat CT scan 03/02/2016 appear to be a little bit more well-defined, possibly a little larger. There were also some possible right upper lobe lung lesions.  Adjuvant treatment consisted of docetaxel, trastuzumab and pertuzumab, with the final (6th) docetaxel dose given 02/11/2016. She continues on trastuzumab and pertuzumab, with the 11th cycle given 05/05/2016. Echocardiogram 02/26/2016 showed an ejection fraction of 55%. She receives denosumab/Xgeva every 4 weeks.. She also receives radiation, started 06/09/201, to be completed 06/26/2016.  Her subsequent history is as detailed below   PAST MEDICAL HISTORY: Past Medical History:  Diagnosis Date   Alcohol abuse    Anemia    during chemo   Anxiety    At age 82   Arthritis Dx 2010   Bipolar disorder ([place])    Bronchitis    Cancer ([place])    breast mets to brain   Chronic pain    Complication of anesthesia    Depression    Family history of adverse reaction to anesthesia    MOther had PONV   Fibromyalgia Dx 2005   GERD (gastroesophageal reflux disease)    Headache    hx  migraines   Lymphedema of left arm    Opiate dependence ([facility])    PONV (postoperative nausea and vomiting)    Port-A-Cath in place    PTSD (post-traumatic stress disorder)      PAST SURGICAL HISTORY: Past Surgical History:  Procedure Laterality Date   APPLICATION OF CRANIAL NAVIGATION N/A 08/14/2016   Procedure: APPLICATION OF CRANIAL NAVIGATION;  Surgeon: Erline Levine, MD;  Location: [facility];  Service: Neurosurgery;  Laterality: N/A;   BREAST RECONSTRUCTION Left    with silicone implant   COLONOSCOPY W/ POLYPECTOMY     CRANIOTOMY N/A 08/14/2016   Procedure: CRANIOTOMY TUMOR EXCISION WITH Lucky Rathke;   Surgeon: Erline Levine, MD;  Location: [facility];  Service: Neurosurgery;  Laterality: N/A;   FIBULA FRACTURE SURGERY Left    MASTECTOMY Left    RADIOLOGY WITH ANESTHESIA N/A 07/23/2016   Procedure: MRI OF BRAIN WITH AND WITHOUT;  Surgeon: Medication Radiologist, MD;  Location: [facility];  Service: Radiology;  Laterality: N/A;   RADIOLOGY WITH ANESTHESIA N/A 09/08/2016   Procedure: MRI OF BRAIN WITH AND WITHOUT CONTRAST;  Surgeon: Medication Radiologist, MD;  Location: [facility];  Service: Radiology;  Laterality: N/A;   RADIOLOGY WITH ANESTHESIA N/A 12/10/2016   Procedure: MRI OF BRAIN WITH AND WITHOUT;  Surgeon: Medication Radiologist, MD;  Location: [facility];  Service: Radiology;  Laterality: N/A;   RADIOLOGY WITH ANESTHESIA N/A 03/02/2017   Procedure: MRI of BRAIN W and W/OUT CONTRAST;  Surgeon: Medication Radiologist, MD;  Location: [facility];  Service: Radiology;  Laterality: N/A;   RADIOLOGY WITH ANESTHESIA N/A 07/29/2017   Procedure: RADIOLOGY WITH ANESTHESIA MRI OF BRAIN WITH AND WITHOUT CONTRAST;  Surgeon: Radiologist, Medication, MD;  Location: [facility];  Service: Radiology;  Laterality: N/A;   RADIOLOGY WITH ANESTHESIA N/A 12/07/2017   Procedure: MRI WITH ANESTHESIA OF BRAIN WITH AND WITHOUT CONTRAST;  Surgeon: Radiologist, Medication, MD;  Location: [facility];  Service: Radiology;  Laterality: N/A;   RADIOLOGY WITH ANESTHESIA N/A 04/07/2018  Procedure: MRI OF BRAIN WITH AND WITHOUT CONTRAST;  Surgeon: Radiologist, Medication, MD;  Location: [facility];  Service: Radiology;  Laterality: N/A;   RADIOLOGY WITH ANESTHESIA N/A 08/23/2018   Procedure: MRI WITH ANESTHESIA OF THE BRAIN WITH AND WITHOUT;  Surgeon: Radiologist, Medication, MD;  Location: [facility];  Service: Radiology;  Laterality: N/A;   RADIOLOGY WITH ANESTHESIA N/A 01/24/2019   Procedure: MRI OF BRAIN WITH AND WITHOUT CONTRAST;  Surgeon: Radiologist, Medication, MD;  Location: [facility];  Service: Radiology;  Laterality: N/A;   RADIOLOGY WITH ANESTHESIA  N/A 07/06/2019   Procedure: MRI WITH ANESTHESIA OF BRAIN WITH AND WITHOUT CONTRAST;  Surgeon: Radiologist, Medication, MD;  Location: [facility];  Service: Radiology;  Laterality: N/A;   right power port placement Right      FAMILY HISTORY Family History  Problem Relation Age of Onset   Diabetes Mother    Bipolar disorder Mother    CAD Father    The patient's father still living, age 43, in [place]. He had prostate cancer at some point in the past. The patient's mother died at age 44 from complications of diabetes. The patient had no brothers, 2 sisters. A paternal grandmother had lung cancer in the setting of tobacco abuse. There is no other history of cancer in the family to her knowledge  GYNECOLOGIC HISTORY:  No LMP recorded. Patient has had an ablation. Menarche approximately age 36. First live birth in 6. The patient is GX P2. She underwent endometrial ablation in 2016.  SOCIAL HISTORY: Updated August 2019 The patient is originally from [place]. She has lived in [place] before but more recently was in [place]. She is back here because she could not afford her rent in [place]. She is living here and a temporary situation. She is divorced. Her 2 children are [name] who lives in [place] and works as a Development worker, community, and [name] who also lives in [place] and works as a Catering manager. The patient has a grandchild, Arelia Longest, 28 years old as of July 2019, living in [place] with his mother. The patient also has a grandson born in February 2019, who also lives in [place]. The patient has not established herself with a local church yet.    ADVANCED DIRECTIVES: Not in place; at the 06/03/2016 visit the patient was given the appropriate forms to complete and notarize at her discretion   HEALTH MAINTENANCE: Social History   Tobacco Use   Smoking status: Current Every Day Smoker    Packs/day: 0.25    Years: 43.00    Pack years: 10.75    Types: Cigarettes   Smokeless  tobacco: Never Used  Substance Use Topics   Alcohol use: No    Comment: no ETOH since 08/22/12   Drug use: No    Comment: states she's in recovery program for 7 years     Colonoscopy:  PAP:  Bone density:   Allergies  Allergen Reactions   Demerol [Meperidine Hcl] Itching and Nausea And Vomiting   Erythromycin Rash    Current Outpatient Medications on File Prior to Visit  Medication Sig Dispense Refill   anastrozole (ARIMIDEX) 1 MG tablet TAKE 1 TABLET BY MOUTH EVERY DAY (Patient taking differently: Take 1 mg by mouth daily. ) 90 tablet 1   Biotin 5 MG TABS Take 5 mg by mouth daily.      Cholecalciferol (VITAMIN D3) 50 MCG (2000 UT) capsule Take 2,000 Units by mouth daily.      cyclobenzaprine (FLEXERIL) 10 MG tablet TAKE 1  TABLET BY MOUTH TWICE A DAY (Patient taking differently: Take 10 mg by mouth 2 (two) times daily. ) 60 tablet 1   cycloSPORINE (RESTASIS) 0.05 % ophthalmic emulsion Place 1 drop into both eyes 2 (two) times daily.     docusate sodium (COLACE) 100 MG capsule Take 100 mg by mouth daily.      fluconazole (DIFLUCAN) 100 MG tablet Take 1 tablet (100 mg total) by mouth daily. 10 tablet 0   fluticasone (FLONASE) 50 MCG/ACT nasal spray SPRAY 2 SPRAYS INTO EACH NOSTRIL EVERY DAY 16 mL 2   gabapentin (NEURONTIN) 300 MG capsule Take 1-2 capsules (300-600 mg total) by mouth See admin instructions. Take 300 mg by mouth in the morning and take 600 mg by mouth at bedtime 90 capsule 0   lamoTRIgine (LAMICTAL) 100 MG tablet Take 100 mg by mouth every morning.     loratadine (CLARITIN) 10 MG tablet Take 1 tablet (10 mg total) by mouth daily. 30 tablet 6   Lurasidone HCl (LATUDA) 60 MG TABS Take 60 mg by mouth daily.     ondansetron (ZOFRAN) 8 MG tablet TAKE 1 TABLET (8 MG TOTAL) BY MOUTH EVERY 8 (EIGHT) HOURS AS NEEDED FOR NAUSEA OR VOMITING. 90 tablet 1   pantoprazole (PROTONIX) 40 MG tablet Take 1 tablet (40 mg total) by mouth daily. Must have office visit for  refills 90 tablet 0   traZODone (DESYREL) 100 MG tablet Take 1 tablet (100 mg total) by mouth at bedtime. (Patient taking differently: Take 300 mg by mouth at bedtime. ) 30 tablet 0   No current facility-administered medications on file prior to visit.      OBJECTIVE:  Vitals:   10/11/19 1022  BP: (!) 145/75  Pulse: (!) 113  Resp: 17  Temp: 98.2 F (36.8 C)  TempSrc: Temporal  SpO2: 98%  Weight: 149 lb 8 oz (67.8 kg)  Height: _0  (1.651 m)  Body mass index is 24.88 kg/m.  For vitals today please see the infusion room flowsheet  ECOG FS: 1 - Symptomatic but completely ambulatory  GENERAL: Patient is a well appearing female in no acute distress HEENT:  Sclerae anicteric. Mask in place. Neck is supple. Ecchymosis over left eye, no swelling noted NODES:  No cervical, supraclavicular, or axillary lymphadenopathy palpated.  BREAST EXAM:  Deferred. LUNGS:  Clear to auscultation bilaterally.  No wheezes or rhonchi. HEART:  Regular rate and rhythm.  HR 96 on ausc.[name] No murmur appreciated. ABDOMEN:  Soft, nontender.  Positive, normoactive bowel sounds. No organomegaly palpated. MSK:  No focal spinal tenderness to palpation. Full range of motion bilaterally in the upper extremities. EXTREMITIES:  No peripheral edema.   SKIN:  Clear with no obvious rashes or skin changes. No nail dyscrasia. NEURO:  Nonfocal. Well oriented.  Appropriate affect.     LAB RESULTS: No results found for: LABCA2  CBC    Component Value Date/Time   WBC 3.0 (L) 10/11/2019 1007   RBC 4.20 10/11/2019 1007   HGB 11.8 (L) 10/11/2019 1007   HGB 13.5 07/05/2019 1129   HGB 12.1 10/26/2017 1038   HCT 37.0 10/11/2019 1007   HCT 35.7 10/26/2017 1038   PLT 44 (L) 10/11/2019 1007   PLT 54 (L) 07/05/2019 1129   PLT 167 10/26/2017 1038   MCV 88.1 10/11/2019 1007   MCV 98.4 10/26/2017 1038   MCH 28.1 10/11/2019 1007   MCHC 31.9 10/11/2019 1007   RDW 15.1 10/11/2019 1007   RDW 13.3 10/26/2017 1038  LYMPHSABS 0.6 (L) 10/11/2019 1007   LYMPHSABS 0.5 (L) 10/26/2017 1038   MONOABS 0.2 10/11/2019 1007   MONOABS 0.1 10/26/2017 1038   EOSABS 0.0 10/11/2019 1007   EOSABS 0.0 10/26/2017 1038   BASOSABS 0.0 10/11/2019 1007   BASOSABS 0.0 10/26/2017 1038   CMP Latest Ref Rng & Units 10/11/2019 09/19/2019 08/30/2019  Glucose 70 - 99 mg/dL 186(H) 102(H) 93  BUN 6 - 20 mg/dL _0 Creatinine 0.44 - 1.00 mg/dL 0.92 0.80 0.83  Sodium 135 - 145 mmol/L 140 140 140  Potassium 3.5 - 5.1 mmol/L 3.3(L) 3.6 3.5  Chloride 98 - 111 mmol/L 103 104 103  CO2 22 - 32 mmol/L _1 Calcium 8.9 - 10.3 mg/dL 9.6 8.9 8.6(L)  Total Protein 6.5 - 8.1 g/dL 6.8 6.8 6.4(L)  Total Bilirubin 0.3 - 1.2 mg/dL 0.4 0.4 0.4  Alkaline Phos 38 - 126 U/L 94 86 83  AST 15 - 41 U/L 34 40 38  ALT 0 - 44 U/L _2 STUDIES: No results found.  ELIGIBLE FOR AVAILABLE RESEARCH PROTOCOL: no  ASSESSMENT: 57 y.o. [place] woman with stage IV left-sided breast cancer involving bone and central nervous system  (1) s/p left breast lower inner quadrant biopsy 06/19/2015 for a clinical T2-3 NX invasive ductal carcinoma, grade 2, triple positive.  (2) status post left mastectomy and axillary lymph node dissection  with immediate expander placement 07/18/2015 for an mpT4 pN2,stage IIIB invasive ductal carcinoma, grade 3, with negative margins.  (a) definitive implant exchange to be scheduled in December   METASTATIC DISEASE: October 2016  (3) CT scan of the chest abdomen and pelvis  09/16/2015 shows metastatic lesions in the right scapula, left iliac crest, L4, and T spine. There were questionable liver cysts, with repeat CT scan 03/02/2016 showing possible right upper lobe lung lesions and possibly increased liver lesions  (a) CT scan of the chest 06/17/2016 shows no active disease in the lungs or liver  (b) Bone scan July 2017 showed no evidence of bony metastatic disease   (c) head CT 07/08/2016 showed a cerebellar  lesion, confirmed by MRI 07/23/2016, status post craniotomy 08/14/2016, confirming a metastatic deposit which was estrogen and progesterone receptor negative, HER-2 amplified with a signals ratio of 7.16, number per cell 13.25  (d) CA 27-29 is not informative  (4) received docetaxel every 3 weeks 6 together with trastuzumab and pertuzumab, last docetaxel dose 02/11/2016  (5) adjuvant radiation7/03/2016 to 06/26/2016 at [facility]: 1. The Left chest wall was treated to 23.4 Gy in 13 fractions at 1.8 Gy per fraction. 2. The Left chest wall was boosted to 10 Gy in 5 fractions at 2 Gy per fraction. 3. The Left Sclav/PAB was treated to 23.4 Gy in 13 fractions at 1.8 Gy per fraction.  [Note: Including the patient's treatment in [facility] (received 15 fractions per Dr. Maryan Rued near [place], [place]), the patient received 50.4 Gy to the left chest wall and supraclavicular region. ]  (6) started trastuzumab and pertuzumab October 2016, continuing every 3 weeks,  (a) echocardiogram 02/26/2016 showed a well preserved ejection fraction  (b) echocardiogram 07/01/2016 shows an ejection fraction in the 60-65%   (c) pertuzumab discontinued 10/2016 with uncontrolled diarrhea  (d) echocardiogram 11/11/2016 showed an ejection fraction in the 60-65%  (e) echocardiogram 03/03/2017 shows an ejection fraction of 60-65%  (f) echocardiogram on 05/19/2017 shows an ejection fraction of 55-60%  (g) echocardiogram 09/24/2017 shows the ejection fraction in  the 60-65%  (h) echocardiogram 02/14/2018 shows an ejection fraction in the 60-65%  (I) echocardiogram  06/30/2018 shows an ejection fraction in the 55-60%  (m) echocardiogram on 12/08/2018 shows an ejection fraction in the 60-65% range      (7) started denosumab/Xgeva October 2017 given every 4 weeks, transitioned to every 8 weeks beginning 10/11/18 (every 6 weeks while giving TDM1 every 3 weeks)  (8) started anastrozole October 2017   (a) bone scan 11/10/2016 shows  no active disease  (b) chest CT scan 11/10/2016 stable, with no evidence of active disease  (c) chest CT and bone scan 07/02/2017 show no evidence of active disease  (d) CT scan of the chest with contrast 11/10/2017 shows some left axillary edema, but no evidence of thrombosis or adenopathy in that area, 0.9 cm precarinal lymph and 0.7 cm right upper lobe nodule node; bone lesions stable  (e) CT of the chest 05/04/2018 shows a 1.4 cm right lower paratracheal node which is slightly increased and a new right prevascular mediastinal node measuring 0.7 cm.  Bone lesions are stable.  (f) chest CT on 07/01/2018 shows no definite findings of metastatic disease in the thorax. Previously noted borderline enlarged low right paratracheal lymph node is stable to slightly decreased in size   (e) chest CT on 12/30/2018 notes mild increase in right paratracheal adneopathy, recommended PET scan.  Pet scan on 01/19/2019 showed a hypermetabolic and enlarged right paratracheal lymph node consistent with breast cancer recurrence.  (f)Trastuzumab discontinued due to February 2020 scans   (g) TDM-1 started on 01/31/2019 given every 21 days.  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  (a) brain irradiation10/19/17-11/08/17: Whole brain/ 35 Gy in 14 fractions   (b) repeat brain MRI obtained 12/10/2016 shows no active disease in the brain  (c) repeat brain MRI 03/02/2017 shows no evidence of residual or recurrent disease  (d) repeat  brain MRI 07/29/2017 shows no evidence of residual or recurrent disease  (e) repeat brain MRI 12/07/2017 shows no evidence of disease recurrence.  There is progressive white matter change secondary to prior treatment.  (f) repeat brain MRI 04/07/2018 showed no evidence of disease\  (g) repeat brain MRI on 08/23/2018 shows no evidence of disease  (h) repeat brain MRI on 01/25/2019 shows no evidence of disease   PLAN: Kamiyah is doing moderately well today.  Her labs are stable, and she has no signs of breast cancer progression.  She will continue on TDM1 every 3 weeks.  She is due for echo next month and I placed these orders today.  She will receive treatment so long as her plt count is above 30.  It has remained in the 37s.    Her recent staging showed resolution of adenopathy and no evidence of disease.  She will be restaged in February around the time she undergoes repeat MRI.    [place] will continue to receive TDM1 every 3 weeks, and we will see her in early January.  She was recommended to continue with the  appropriate pandemic precautions. She knows to call for any questions that may arise between now and her next appointment.  We are happy to see her sooner if needed.   A total of (20) minutes of face-to-face time was spent with this patient with greater than 50% of that time in counseling and care-coordination.   Wilber Bihari, NP   10/11/19 10:55 AM Medical Oncology and Hematology [facility] [address] [place], [place] [address] Tel. [phone]    Fax. [phone]

## 2019-10-11 NOTE — Patient Instructions (Addendum)
[facility] Discharge Instructions for Patients Receiving Chemotherapy  Today you received the following chemotherapy agents Ado-trastuzumab (KADCYLA).  To help prevent nausea and vomiting after your treatment, we encourage you to take your nausea medication as prescribed.   If you develop nausea and vomiting that is not controlled by your nausea medication, call the clinic.   BELOW ARE SYMPTOMS THAT SHOULD BE REPORTED IMMEDIATELY:  *FEVER GREATER THAN 100.5 F  *CHILLS WITH OR WITHOUT FEVER  NAUSEA AND VOMITING THAT IS NOT CONTROLLED WITH YOUR NAUSEA MEDICATION  *UNUSUAL SHORTNESS OF BREATH  *UNUSUAL BRUISING OR BLEEDING  TENDERNESS IN MOUTH AND THROAT WITH OR WITHOUT PRESENCE OF ULCERS  *URINARY PROBLEMS  *BOWEL PROBLEMS  UNUSUAL RASH Items with * indicate a potential emergency and should be followed up as soon as possible.  Feel free to call the clinic should you have any questions or concerns. The clinic phone number is [phone].  Please show the CHEMO ALERT CARD at check-in to the Emergency Department and triage nurse.  Coronavirus (COVID-19) Are you at risk?  Are you at risk for the Coronavirus (COVID-19)?  To be considered HIGH RISK for Coronavirus (COVID-19), you have to meet the following criteria:  . Traveled to [place], [place], [place], [place] or [place]; or in the [place] to [place], [place], [place], or [place]; and have fever, cough, and shortness of breath within the last 2 weeks of travel OR . Been in close contact with a person diagnosed with COVID-19 within the last 2 weeks and have fever, cough, and shortness of breath . IF YOU DO NOT MEET THESE CRITERIA, YOU ARE CONSIDERED LOW RISK FOR COVID-19.  What to do if you are HIGH RISK for COVID-19?  . If you are having a medical emergency, call 911. . Seek medical care right away. Before you go to a doctor's office, urgent care or emergency department, call ahead and tell  them about your recent travel, contact with someone diagnosed with COVID-19, and your symptoms. You should receive instructions from your physician's office regarding next steps of care.  . When you arrive at healthcare provider, tell the healthcare staff immediately you have returned from visiting [place], [place], [place], [place] or [place]; or traveled in the [place] to [place], [place], [place], or [place]; in the last two weeks or you have been in close contact with a person diagnosed with COVID-19 in the last 2 weeks.   . Tell the health care staff about your symptoms: fever, cough and shortness of breath. . After you have been seen by a medical provider, you will be either: o Tested for (COVID-19) and discharged home on quarantine except to seek medical care if symptoms worsen, and asked to  - Stay home and avoid contact with others until you get your results (4-5 days)  - Avoid travel on public transportation if possible (such as bus, train, or airplane) or o Sent to the Emergency Department by EMS for evaluation, COVID-19 testing, and possible admission depending on your condition and test results.  What to do if you are LOW RISK for COVID-19?  Reduce your risk of any infection by using the same precautions used for avoiding the common cold or flu:  . Wash your hands often with soap and warm water for at least 20 seconds.  If soap and water are not readily available, use an alcohol-based hand sanitizer with at least 60% alcohol.  . If coughing or   sneezing, cover your mouth and nose by coughing or sneezing into the elbow areas of your shirt or coat, into a tissue or into your sleeve (not your hands). . Avoid shaking hands with others and consider head nods or verbal greetings only. . Avoid touching your eyes, nose, or mouth with unwashed hands.  . Avoid close contact with people who are sick. . Avoid places or events with large numbers of people in one location, like concerts or  sporting events. . Carefully consider travel plans you have or are making. . If you are planning any travel outside or inside the [place], visit the CDC's Travelers' Health webpage for the latest health notices. . If you have some symptoms but not all symptoms, continue to monitor at home and seek medical attention if your symptoms worsen. . If you are having a medical emergency, call 911.   [place] / e-Visit: [email or web address]         [facility]: [facility]: [id number]                   [facility]: [id number]     Thrombocytopenia Thrombocytopenia means that you have a low number of platelets in your blood. Platelets are tiny cells in the blood. When you bleed, they clump together at the cut or injury to stop the bleeding. This is called blood clotting. If you do not have enough platelets, it can cause bleeding problems. Some cases of this condition are mild while others are more severe. What are the causes? This condition may be caused by:  Your body not making enough platelets. This may be caused by: ? Your bone marrow not making blood cells (aplastic anemia). ? Cancer in the bone marrow. ? Certain medicines. ? Infection in the bone marrow. ? Drinking a lot of alcohol.  Your body destroying platelets too quickly. This may be caused by: ? Certain immune diseases. ? Certain medicines. ? Certain blood clotting disorders. ? Certain disorders that are passed from parent to child (inherited). ? Certain bleeding disorders. ? Pregnancy. ? Having a spleen that is larger than normal. What are the signs or symptoms?  Bleeding that is not normal.  Nosebleeds.  Heavy menstrual periods.  Blood in the pee (urine) or poop (stool).  A purple-like color to the skin (purpura).  Bruising.  A rash that looks like pinpoint,  purple-red spots (petechiae). How is this treated?  Treatment of another condition that is causing the low platelet count.  Medicines to help protect your platelets from being destroyed.  A replacement (transfusion) of platelets to stop or prevent bleeding.  Surgery to remove the spleen. Follow these instructions at home: Activity  Avoid activities that could cause you to get hurt or bruised. Follow instructions about how to prevent falls.  Take care not to cut yourself: ? When you shave. ? When you use scissors, needles, knives, or other tools.  Take care not to burn yourself: ? When you use an iron. ? When you cook. General instructions   Check your skin and the inside of your mouth for bruises or blood as told by your doctor.  Check to see if there is blood in your spit (sputum), pee, and poop. Do this as told by your doctor.  Do not drink alcohol.  Take over-the-counter and prescription medicines only as told by your doctor.  Do not take any medicines that have  aspirin or NSAIDs in them. These medicines can thin your blood and cause you to bleed.  Tell all of your doctors that you have this condition. Be sure to tell your dentist and eye doctor too. Contact a doctor if:  You have bruises and you do not know why. Get help right away if:  You are bleeding anywhere on your body.  You have blood in your spit, pee, or poop. Summary  Thrombocytopenia means that you have a low number of platelets in your blood.  Platelets are needed for blood clotting.  Symptoms of this condition include bleeding that is not normal, and bruising.  Take care not to cut or burn yourself. This information is not intended to replace advice given to you by your health care provider. Make sure you discuss any questions you have with your health care provider. Document Released: 11/05/2011 Document Revised: 08/18/2018 Document Reviewed: 08/18/2018 Elsevier Patient Education  2020  [employer].

## 2019-10-12 LAB — CANCER ANTIGEN 27.29: CA 27.29: 18.2 U/mL (ref 0.0–38.6)

## 2019-10-13 ENCOUNTER — Other Ambulatory Visit: Payer: Self-pay

## 2019-10-13 ENCOUNTER — Encounter: Payer: Self-pay | Admitting: Physical Therapy

## 2019-10-13 ENCOUNTER — Ambulatory Visit: Payer: Medicaid Other | Admitting: Physical Therapy

## 2019-10-13 DIAGNOSIS — R293 Abnormal posture: Secondary | ICD-10-CM

## 2019-10-13 DIAGNOSIS — M6281 Muscle weakness (generalized): Secondary | ICD-10-CM

## 2019-10-13 DIAGNOSIS — R2689 Other abnormalities of gait and mobility: Secondary | ICD-10-CM | POA: Diagnosis not present

## 2019-10-13 DIAGNOSIS — R2681 Unsteadiness on feet: Secondary | ICD-10-CM

## 2019-10-15 NOTE — Therapy (Signed)
[facility] [address] [place], [place], [address] Phone: [phone]   Fax:  [phone]  Physical Therapy Treatment  Patient Details  Name: [name] MRN: [id number] Date of Birth: 08-26-1961 Referring Provider (PT): Wilber Bihari, NP   Encounter Date: 10/13/2019     10/13/19 0852  PT Visits / Re-Eval  Visit Number 9  Number of Visits 12  Date for PT Re-Evaluation 11/20/19  Authorization  Authorization Type Medicaid (per email from Antony Salmon and Trudee Kuster 08/22/2019, >1 medicaid eval is allowed in a year);  Authorization Time Period 08/31/2019-09/20/2019; 09/28/19-10/25/19 (8 visits)  Authorization - Visit Number 5  Authorization - Number of Visits 8  PT Time Calculation  PT Start Time 0849  PT Stop Time 0929  PT Time Calculation (min) 40 min  PT - End of Session  Equipment Utilized During Treatment Gait belt  Activity Tolerance Patient tolerated treatment well;No increased pain  Behavior During Therapy [facility] for tasks assessed/performed (decreased safety awareness; needs repeated cues for braking technique, hand placement with transfers)    Past Medical History:  Diagnosis Date  . Alcohol abuse   . Anemia    during chemo  . Anxiety    At age 50  . Arthritis Dx 2010  . Bipolar disorder ([place])   . Bronchitis   . Cancer ([place])    breast mets to brain  . Chronic pain   . Complication of anesthesia   . Depression   . Family history of adverse reaction to anesthesia    MOther had PONV  . Fibromyalgia Dx 2005  . GERD (gastroesophageal reflux disease)   . Headache    hx  migraines  . Lymphedema of left arm   . Opiate dependence ([place])   . PONV (postoperative nausea and vomiting)   . Port-A-Cath in place   . PTSD (post-traumatic stress disorder)     Past Surgical History:  Procedure Laterality Date  . APPLICATION OF CRANIAL NAVIGATION N/A 08/14/2016   Procedure: APPLICATION OF CRANIAL  NAVIGATION;  Surgeon: Erline Levine, MD;  Location: [place] [facility];  Service: Neurosurgery;  Laterality: N/A;  . BREAST RECONSTRUCTION Left    with silicone implant  . COLONOSCOPY W/ POLYPECTOMY    . CRANIOTOMY N/A 08/14/2016   Procedure: CRANIOTOMY TUMOR EXCISION WITH Lucky Rathke;  Surgeon: Erline Levine, MD;  Location: [place] [facility];  Service: Neurosurgery;  Laterality: N/A;  . FIBULA FRACTURE SURGERY Left   . MASTECTOMY Left   . RADIOLOGY WITH ANESTHESIA N/A 07/23/2016   Procedure: MRI OF BRAIN WITH AND WITHOUT;  Surgeon: Medication Radiologist, MD;  Location: [place];  Service: Radiology;  Laterality: N/A;  . RADIOLOGY WITH ANESTHESIA N/A 09/08/2016   Procedure: MRI OF BRAIN WITH AND WITHOUT CONTRAST;  Surgeon: Medication Radiologist, MD;  Location: [facility];  Service: Radiology;  Laterality: N/A;  . RADIOLOGY WITH ANESTHESIA N/A 12/10/2016   Procedure: MRI OF BRAIN WITH AND WITHOUT;  Surgeon: Medication Radiologist, MD;  Location: [place];  Service: Radiology;  Laterality: N/A;  . RADIOLOGY WITH ANESTHESIA N/A 03/02/2017   Procedure: MRI of BRAIN W and W/OUT CONTRAST;  Surgeon: Medication Radiologist, MD;  Location: [facility];  Service: Radiology;  Laterality: N/A;  . RADIOLOGY WITH ANESTHESIA N/A 07/29/2017   Procedure: RADIOLOGY WITH ANESTHESIA MRI OF BRAIN WITH AND WITHOUT CONTRAST;  Surgeon: Radiologist, Medication, MD;  Location: [facility];  Service: Radiology;  Laterality: N/A;  . RADIOLOGY WITH ANESTHESIA N/A 12/07/2017   Procedure: MRI WITH ANESTHESIA  OF BRAIN WITH AND WITHOUT CONTRAST;  Surgeon: Radiologist, Medication, MD;  [facility];  Service: Radiology;  Laterality: N/A;  . RADIOLOGY WITH ANESTHESIA N/A 04/07/2018   Procedure: MRI OF BRAIN WITH AND WITHOUT CONTRAST;  Surgeon: Radiologist, Medication, MD;  Location: [facility];  Service: Radiology;  Laterality: N/A;  . RADIOLOGY WITH ANESTHESIA N/A 08/23/2018   Procedure: MRI WITH ANESTHESIA OF THE BRAIN WITH AND WITHOUT;  Surgeon: Radiologist, Medication, MD;   [facility];  Service: Radiology;  Laterality: N/A;  . RADIOLOGY WITH ANESTHESIA N/A 01/24/2019   Procedure: MRI OF BRAIN WITH AND WITHOUT CONTRAST;  Surgeon: Radiologist, Medication, MD;  [facility];  Service: Radiology;  Laterality: N/A;  . RADIOLOGY WITH ANESTHESIA N/A 07/06/2019   Procedure: MRI WITH ANESTHESIA OF BRAIN WITH AND WITHOUT CONTRAST;  Surgeon: Radiologist, Medication, MD;  Location[facility];  Service: Radiology;  Laterality: N/A;  . right power port placement Right     There were no vitals filed for this visit.     10/13/19 0851  Symptoms/Limitations  Subjective To therapy today with rollator. Reports no falls for 2 weeks now. Has not done the ex's this week because the folder has been in her boyfriends truck. No pain.  Pertinent History Per [address] lymphedema eval 07/2019:  Treated here in March of last year currently with a reid sleeve, day sleeve and compression pump. Lt mastectomy and ALND with reconstruction 07/08/15 for T4N2 stage IIIB IDC. Metastatic disease in the brain s/p craniotomy 08/14/16. Chemo completed in 2017 Trastuzumab and Pertuzumab, docetaxel. Radiation completed 2017. Stable bone lesions to the Rt scapula, Lt iliac crest, L4, Tspine. Also probable in the liver and lungs. Pt is also bipolar and a recovered drug and alchohol user.  Current treatment TDM-1, denosumab and anastrozole.  Limitations Other (comment)  Patient Stated Goals To get more stability back in legs  Pain Assessment  Currently in Pain? No/denies  Pain Score 0        10/13/19 0853  Transfers  Transfers Sit to Stand;Stand to Sit  Sit to Stand 5: Supervision;With upper extremity assist;From chair/3-in-1;From bed  Stand to Sit 5: Supervision;With upper extremity assist;To chair/3-in-1;To bed  Ambulation/Gait  Ambulation/Gait Yes  Ambulation/Gait Assistance 5: Supervision  Ambulation/Gait Assistance Details cues for hand placement on brakes of rollator, to keep rollator closer  with gait and for posture with gait.   Ambulation Distance (Feet)  (around gym with session)  Assistive device Rollator  Gait Pattern Step-through pattern;Decreased stride length;Shuffle;Narrow base of support;Poor foot clearance - left;Poor foot clearance - right  Ambulation Surface Level;Indoor  High Level Balance  High Level Balance Activities Side stepping;Tandem walking;Marching forwards;Marching backwards  High Level Balance Comments on blue mat in parallel bars- 3-4 laps each with cues on form/technique. light UE support to no UE support on bars, cues on form and technique,   Knee/Hip Exercises: Aerobic  Other Aerobic Scifit UE/LE's level 1.5 for 3 minute work, 2 minute rest x 2 reps, with rpm >/= 30 for strengthening and activity tolerance.   Knee/Hip Exercises: Standing  Forward Step Up Both;1 set;10 reps;Step Height: 4";Hand Hold: 2;Limitations  Lateral Step Up Both;1 set;10 reps;Hand Hold: 2;Step Height: 4";Limitations  Lateral Step Up Limitations cues on form and technique, cues for decreased UE support on bars.   Forward Step Up Limitations cues on form and technique; cues for decreased UE support on bars with each rep.       10/13/19 0922  Balance Exercises: Standing    Standing Eyes Closed Narrow base of support (BOS);Wide (BOA);Head turns;Foam/compliant surface;Other reps (comment);30 secs;Limitations  Balance Exercises: Standing  Standing Eyes Closed Limitations on 1 inch foam with no UE support, min guard to min assist for balance: feet apart to feet together for EC no head movements, then with feet apart for EC head movements left<>right, then up<>down. min guard to min assist for balance. cues on posture and weight shifitng for balance assistance.           PT Short Term Goals - 09/18/19 0810      PT SHORT TERM GOAL #1   Title  Pt will be independent with HEP to address balance, strength, and gait.  TARGET 3 weeks (09/22/2019)    Baseline  Initiated OTAGO HEP, with  pt needing cues for correct technique of exercises    Time  3    Period  Weeks    Status  Partially Met    Target Date  09/22/19      PT SHORT TERM GOAL #2   Title  Pt will improve TUG score to less than or equal to 13.5 seconds for decreased fall risk.    Baseline  TUG score 17.19 seconds (>13.5 seconds indicates increased fall risk) at eval; 13.03 sec at best 09/18/2019    Time  3    Period  Weeks    Status  Achieved    Target Date  09/22/19      PT SHORT TERM GOAL #3   Title  Pt will verbalize fall prevention in home envrionment.    Baseline  At fall risk per TUG, DGI scores    Time  3    Period  Weeks    Status  Achieved    Target Date  09/22/19        PT Long Term Goals - 09/18/19 1523      PT LONG TERM GOAL #1   Title  Pt will be independent with progression of balance exercise program for decreased fall risk.  TARGET 10/20/2019    Baseline  No formal balance HEP    Time  7    Period  Weeks    Status  On-going      PT LONG TERM GOAL #2   Title  Pt will improve DGI score to at least 16/24 for decreased fall risk.    Baseline  DGI score 11/24 (Scores <19/24 indicate increased fall risk)    Time  7    Status  On-going      PT LONG TERM GOAL #3   Title  Pt will improve gait velocity to at least 2.3 ft/sec for improved gait efficiency and safety.    Baseline  Gait velocity 1.95 ft/sec (1.31-2.62 f/tsec indicates limited community ambulator)    Time  7    Period  Weeks    Status  On-going      PT LONG TERM GOAL #4   Title  Pt will negotiate 1 curb step to simulate step up into home, no rail, independentlyf or improved safety with curbs/home entry.    Baseline  Pt reports one near fall with curb step; unable to perform stepping over obstacle without stopping, SLS <1 second each leg    Time  7    Period  Weeks    Status  On-going         10/13/19 0254  Plan  Clinical Impression Statement Today's skilled session continued to focus on gait with rollator, balance  reactions  and LE strengthening. No issues other than fatigue reported with pt taking rest breaks through out the session to recover. The pt was able to tolerate increased activity this session vs with previous sessions. The pt is progressing toward goals and should benefit from continued PT to progress toward unmet goals.  Personal Factors and Comorbidities Comorbidity 3+  Comorbidities See PMH (subjective); metastatic breast cancer, hx of bipolar and anxiety  Examination-Activity Limitations Stairs (Curbs)  Examination-Participation Restrictions  (Work)  Pt will benefit from skilled therapeutic intervention in order to improve on the following deficits Abnormal gait;Difficulty walking;Decreased mobility;Decreased strength;Decreased balance  Stability/Clinical Decision Making Stable/Uncomplicated  Rehab Potential Good  PT Frequency Other (comment) (1x/wk for 3 weeks, then 2x/wk for 4 weeks)  PT Duration  (7 weeks total POC)  PT Treatment/Interventions ADLs/Self Care Home Management;Neuromuscular re-education;Gait training;Stair training;Patient/family education;Therapeutic activities;Functional mobility training;Therapeutic exercise;Balance training;DME Instruction  PT Next Visit Plan gait training with rollator; hip/ankle/step strategies, balance training  Consulted and Agree with Plan of Care Patient        Patient will benefit from skilled therapeutic intervention in order to improve the following deficits and impairments:  Abnormal gait, Difficulty walking, Decreased mobility, Decreased strength, Decreased balance  Visit Diagnosis: Other abnormalities of gait and mobility  Unsteadiness on feet  Muscle weakness (generalized)  Abnormal posture     Problem List Patient Active Problem List   Diagnosis Date Noted  . Thrombocytopenia ([place]) 08/09/2019  . Port-A-Cath in place 06/20/2019  . 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 S/P mastectomy,  left 12/09/2018  . S/P breast reconstruction, left 12/09/2018  . S/P radiation therapy 12/09/2018  . GERD (gastroesophageal reflux disease) 12/10/2017  . Edema 11/09/2017  . Sensorineural hearing loss (SNHL) of both ears 11/05/2017  . Vitamin D deficiency 01/18/2017  . Bipolar I disorder, most recent episode depressed ([place]) 12/08/2016  . Adjustment disorder with anxiety 12/06/2016  . Metastatic breast cancer ([place])   . History of cancer metastatic to brain 07/27/2016  . Iron deficiency anemia 06/26/2016  . Bone metastases ([place]) 06/03/2016  . Primary cancer of lower-inner quadrant of left female breast ([place]) 06/01/2016  . Current smoker 03/28/2015  . Seasonal allergies 03/28/2015  . Anxiety state 02/28/2015  . Fibromyalgia 02/28/2015  . Family history of diabetes mellitus 02/28/2015  . H/O alcohol abuse    [place], [place], [facility] [address], [place], [place] [address] [phone] 10/15/19, 7:59 PM   Name: Neshia Mckenzie MRN: [id number] Date of Birth: [birth year]

## 2019-10-17 ENCOUNTER — Ambulatory Visit: Payer: Medicaid Other | Admitting: Physical Therapy

## 2019-10-17 ENCOUNTER — Other Ambulatory Visit: Payer: Self-pay

## 2019-10-17 ENCOUNTER — Encounter: Payer: Self-pay | Admitting: Physical Therapy

## 2019-10-17 DIAGNOSIS — R2689 Other abnormalities of gait and mobility: Secondary | ICD-10-CM | POA: Diagnosis not present

## 2019-10-17 DIAGNOSIS — R2681 Unsteadiness on feet: Secondary | ICD-10-CM

## 2019-10-17 DIAGNOSIS — M6281 Muscle weakness (generalized): Secondary | ICD-10-CM

## 2019-10-17 DIAGNOSIS — R293 Abnormal posture: Secondary | ICD-10-CM

## 2019-10-17 NOTE — Therapy (Signed)
[facility] [address] [place], [place], [address] Phone: [phone]   Fax:  [phone]  Physical Therapy Treatment  Patient Details  Name: [name] MRN: [id number] Date of Birth: 01/23/1961 Referring Provider (PT): Wilber Bihari, NP   Encounter Date: 10/17/2019  PT End of Session - 10/17/19 0851    Visit Number  10    Number of Visits  12    Date for PT Re-Evaluation  11/20/19    Authorization Type  Medicaid (per email from Antony Salmon and Trudee Kuster 08/22/2019, >1 medicaid eval is allowed in a year);    Authorization Time Period  08/31/2019-09/20/2019; 09/28/19-10/25/19 (8 visits)    Authorization - Visit Number  6    Authorization - Number of Visits  8    PT Start Time  0848    PT Stop Time  0929    PT Time Calculation (min)  41 min    Equipment Utilized During Treatment  Gait belt    Activity Tolerance  Patient tolerated treatment well;No increased pain    Behavior During Therapy  [facility] for tasks assessed/performed   decreased safety awareness; needs repeated cues for braking technique, hand placement with transfers      Past Medical History:  Diagnosis Date  . Alcohol abuse   . Anemia    during chemo  . Anxiety    At age 18  . Arthritis Dx 2010  . Bipolar disorder ([place])   . Bronchitis   . Cancer ([place])    breast mets to brain  . Chronic pain   . Complication of anesthesia   . Depression   . Family history of adverse reaction to anesthesia    MOther had PONV  . Fibromyalgia Dx 2005  . GERD (gastroesophageal reflux disease)   . Headache    hx  migraines  . Lymphedema of left arm   . Opiate dependence ([place])   . PONV (postoperative nausea and vomiting)   . Port-A-Cath in place   . PTSD (post-traumatic stress disorder)     Past Surgical History:  Procedure Laterality Date  . APPLICATION OF CRANIAL NAVIGATION N/A 08/14/2016   Procedure: APPLICATION OF CRANIAL NAVIGATION;  Surgeon: Erline Levine, MD;  Location: [facility];  Service: Neurosurgery;  Laterality: N/A;  . BREAST RECONSTRUCTION Left    with silicone implant  . COLONOSCOPY W/ POLYPECTOMY    . CRANIOTOMY N/A 08/14/2016   Procedure: CRANIOTOMY TUMOR EXCISION WITH Lucky Rathke;  Surgeon: Erline Levine, MD;  Location: [facility];  Service: Neurosurgery;  Laterality: N/A;  . FIBULA FRACTURE SURGERY Left   . MASTECTOMY Left   . RADIOLOGY WITH ANESTHESIA N/A 07/23/2016   Procedure: MRI OF BRAIN WITH AND WITHOUT;  Surgeon: Medication Radiologist, MD;  Location: [facility];  Service: Radiology;  Laterality: N/A;  . RADIOLOGY WITH ANESTHESIA N/A 09/08/2016   Procedure: MRI OF BRAIN WITH AND WITHOUT CONTRAST;  Surgeon: Medication Radiologist, MD;  Location: [facility];  Service: Radiology;  Laterality: N/A;  . RADIOLOGY WITH ANESTHESIA N/A 12/10/2016   Procedure: MRI OF BRAIN WITH AND WITHOUT;  Surgeon: Medication Radiologist, MD;  Location: [facility];  Service: Radiology;  Laterality: N/A;  . RADIOLOGY WITH ANESTHESIA N/A 03/02/2017   Procedure: MRI of BRAIN W and W/OUT CONTRAST;  Surgeon: Medication Radiologist, MD;  Location: [facility];  Service: Radiology;  Laterality: N/A;  . RADIOLOGY WITH ANESTHESIA N/A 07/29/2017   Procedure: RADIOLOGY WITH ANESTHESIA MRI OF BRAIN WITH AND WITHOUT CONTRAST;  Surgeon: Radiologist, Medication, MD;  Location: [facility];  Service: Radiology;  Laterality: N/A;  . RADIOLOGY WITH ANESTHESIA N/A 12/07/2017   Procedure: MRI WITH ANESTHESIA OF BRAIN WITH AND WITHOUT CONTRAST;  Surgeon: Radiologist, Medication, MD;  Location: [facility];  Service: Radiology;  Laterality: N/A;  . RADIOLOGY WITH ANESTHESIA N/A 04/07/2018   Procedure: MRI OF BRAIN WITH AND WITHOUT CONTRAST;  Surgeon: Radiologist, Medication, MD;  Location: [facility];  Service: Radiology;  Laterality: N/A;  . RADIOLOGY WITH ANESTHESIA N/A 08/23/2018   Procedure: MRI WITH ANESTHESIA OF THE BRAIN WITH AND WITHOUT;  Surgeon: Radiologist, Medication, MD;  Location: [facility];  Service:  Radiology;  Laterality: N/A;  . RADIOLOGY WITH ANESTHESIA N/A 01/24/2019   Procedure: MRI OF BRAIN WITH AND WITHOUT CONTRAST;  Surgeon: Radiologist, Medication, MD;  Location: [facility];  Service: Radiology;  Laterality: N/A;  . RADIOLOGY WITH ANESTHESIA N/A 07/06/2019   Procedure: MRI WITH ANESTHESIA OF BRAIN WITH AND WITHOUT CONTRAST;  Surgeon: Radiologist, Medication, MD;  Location: [facility];  Service: Radiology;  Laterality: N/A;  . right power port placement Right     There were no vitals filed for this visit.  Subjective Assessment - 10/17/19 0850    Subjective  Does not have rollator as she took a cab and did not want the fare to run up while she was loading or unloading it into the car. Reports no falls or pain. Has not done the HEP as the folder is still in her boyfriends trunk.    Pertinent History  Per [employer] lymphedema eval 07/2019:  Treated here in March of last year currently with a reid sleeve, day sleeve and compression pump. Lt mastectomy and ALND with reconstruction 07/08/15 for T4N2 stage IIIB IDC. Metastatic disease in the brain s/p craniotomy 08/14/16. Chemo completed in 2017 Trastuzumab and Pertuzumab, docetaxel. Radiation completed 2017. Stable bone lesions to the Rt scapula, Lt iliac crest, L4, Tspine. Also probable in the liver and lungs. Pt is also bipolar and a recovered drug and alchohol user.  Current treatment TDM-1, denosumab and anastrozole.    Limitations  Other (comment)    Patient Stated Goals  To get more stability back in legs    Currently in Pain?  No/denies          [facility] Adult PT Treatment/Exercise - 10/17/19 0851      Transfers   Transfers  Sit to Stand;Stand to Sit    Sit to Stand  5: Supervision;With upper extremity assist;From chair/3-in-1;From bed    Stand to Sit  5: Supervision;With upper extremity assist;To chair/3-in-1;To bed      Ambulation/Gait   Ambulation/Gait  Yes    Ambulation/Gait Assistance  5: Supervision;4: Min guard    Ambulation/Gait  Assistance Details  continues to need cues for safety with use of brakes on rollator- leave it locked with standing, hand placement on them with gait and to lock it prior to sitting down. min guard assist for gait into gym with no AD. supervision for gait with rollator in session and back to lobby.     Ambulation Distance (Feet)  --   around gym with activity   Assistive device  Rollator;None   use of clinic rollator in session   Gait Pattern  Step-through pattern;Decreased stride length;Shuffle;Narrow base of support;Poor foot clearance - left;Poor foot clearance - right    Ambulation Surface  Level;Indoor    Gait velocity  13.93 sec's= 2.35 ft/sec with rollator      High Level  Balance   High Level Balance Activities  Negotiating over obstacles    High Level Balance Comments  in parallel bars- fwd reciprocal stepping over hurdles of varied heights for 6 laps with bil UE support, cues for increased step height and increased step length; laterally stepping over hurddles of varied heights for 2 laps each way with bil UE support, cues on increased step height/length.       Exercises   Exercises  Other Exercises    Other Exercises   seated with feet across red foam beam: sit<>stand for 10 reps with emphasis on tall posture and slow, controlled descent for sitting back down. min guard to min assist for balance.       Knee/Hip Exercises: Aerobic   Other Aerobic  Scifit UE/LE's level 1.5 for 4 minute work, 2 minute rest x 2 reps, with rpm >/= 30 for strengthening and activity tolerance.           Balance Exercises - 10/17/19 0913      Balance Exercises: Standing   Rockerboard  Anterior/posterior;Head turns;EO;EC;20 seconds;10 reps      Balance Exercises: Standing   Standing Eyes Closed Limitations  on balance board- rocking board with emphasis on tall posture with EO; holding board steady for EC no head movements, progressing to EO for head movements left<>right, then up<>down. cues on  posture, weight shifting for balnance, min guard to min assist for balance. intermittent touch to bars for balance.           PT Short Term Goals - 09/18/19 0810      PT SHORT TERM GOAL #1   Title  Pt will be independent with HEP to address balance, strength, and gait.  TARGET 3 weeks (09/22/2019)    Baseline  Initiated OTAGO HEP, with pt needing cues for correct technique of exercises    Time  3    Period  Weeks    Status  Partially Met    Target Date  09/22/19      PT SHORT TERM GOAL #2   Title  Pt will improve TUG score to less than or equal to 13.5 seconds for decreased fall risk.    Baseline  TUG score 17.19 seconds (>13.5 seconds indicates increased fall risk) at eval; 13.03 sec at best 09/18/2019    Time  3    Period  Weeks    Status  Achieved    Target Date  09/22/19      PT SHORT TERM GOAL #3   Title  Pt will verbalize fall prevention in home envrionment.    Baseline  At fall risk per TUG, DGI scores    Time  3    Period  Weeks    Status  Achieved    Target Date  09/22/19        PT Long Term Goals - 09/18/19 1523      PT LONG TERM GOAL #1   Title  Pt will be independent with progression of balance exercise program for decreased fall risk.  TARGET 10/20/2019    Baseline  No formal balance HEP    Time  7    Period  Weeks    Status  On-going      PT LONG TERM GOAL #2   Title  Pt will improve DGI score to at least 16/24 for decreased fall risk.    Baseline  DGI score 11/24 (Scores <19/24 indicate increased fall risk)    Time  7  Status  On-going      PT LONG TERM GOAL #3   Title  Pt will improve gait velocity to at least 2.3 ft/sec for improved gait efficiency and safety.    Baseline  Gait velocity 1.95 ft/sec (1.31-2.62 f/tsec indicates limited community ambulator)    Time  7    Period  Weeks    Status  On-going      PT LONG TERM GOAL #4   Title  Pt will negotiate 1 curb step to simulate step up into home, no rail, independentlyf or improved safety  with curbs/home entry.    Baseline  Pt reports one near fall with curb step; unable to perform stepping over obstacle without stopping, SLS <1 second each leg    Time  7    Period  Weeks    Status  On-going            Plan - 10/17/19 0851    Clinical Impression Statement  Today's skilled session continued to focus on LE strengthening and balance reactions with rest breaks needed due to fatigue. The pt does continue to make progress toward goals and should benefit from continued PT to progress toward unmet goals.    Personal Factors and Comorbidities  Comorbidity 3+    Comorbidities  See PMH (subjective); metastatic breast cancer, hx of bipolar and anxiety    Examination-Activity Limitations  Stairs   Curbs   Examination-Participation Restrictions  --   Work   Stability/Clinical Decision Making  Stable/Uncomplicated    Rehab Potential  Good    PT Frequency  Other (comment)   1x/wk for 3 weeks, then 2x/wk for 4 weeks   PT Duration  --   7 weeks total POC   PT Treatment/Interventions  ADLs/Self Care Home Management;Neuromuscular re-education;Gait training;Stair training;Patient/family education;Therapeutic activities;Functional mobility training;Therapeutic exercise;Balance training;DME Instruction    PT Next Visit Plan  gait training with rollator; hip/ankle/step strategies, balance training    Consulted and Agree with Plan of Care  Patient       Patient will benefit from skilled therapeutic intervention in order to improve the following deficits and impairments:  Abnormal gait, Difficulty walking, Decreased mobility, Decreased strength, Decreased balance  Visit Diagnosis: Other abnormalities of gait and mobility  Unsteadiness on feet  Muscle weakness (generalized)  Abnormal posture     Problem List Patient Active Problem List   Diagnosis Date Noted  . Thrombocytopenia ([place]) 08/09/2019  . Port-A-Cath in place 06/20/2019  . 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 S/P mastectomy, left 12/09/2018  . S/P breast reconstruction, left 12/09/2018  . S/P radiation therapy 12/09/2018  . GERD (gastroesophageal reflux disease) 12/10/2017  . Edema 11/09/2017  . Sensorineural hearing loss (SNHL) of both ears 11/05/2017  . Vitamin D deficiency 01/18/2017  . Bipolar I disorder, most recent episode depressed ([place]) 12/08/2016  . Adjustment disorder with anxiety 12/06/2016  . Metastatic breast cancer ([place])   . History of cancer metastatic to brain 07/27/2016  . Iron deficiency anemia 06/26/2016  . Bone metastases ([place]) 06/03/2016  . Primary cancer of lower-inner quadrant of left female breast ([place]) 06/01/2016  . Current smoker 03/28/2015  . Seasonal allergies 03/28/2015  . Anxiety state 02/28/2015  . Fibromyalgia 02/28/2015  . Family history of diabetes mellitus 02/28/2015  . H/O alcohol abuse    [place], [place], [facility] [address] [place], [place] [address] [phone] 10/17/19, 9:15 PM   Name: Danyla Wattley  MRN: [id number] Date of Birth: Aug 01, 1961

## 2019-10-19 ENCOUNTER — Ambulatory Visit: Payer: Medicaid Other | Admitting: Physical Therapy

## 2019-10-23 ENCOUNTER — Ambulatory Visit: Payer: Medicaid Other | Admitting: Physical Therapy

## 2019-10-24 ENCOUNTER — Ambulatory Visit: Payer: Medicaid Other | Admitting: Physical Therapy

## 2019-10-27 ENCOUNTER — Other Ambulatory Visit: Payer: Self-pay | Admitting: Family Medicine

## 2019-10-27 ENCOUNTER — Other Ambulatory Visit: Payer: Self-pay | Admitting: Critical Care Medicine

## 2019-10-27 DIAGNOSIS — J302 Other seasonal allergic rhinitis: Secondary | ICD-10-CM

## 2019-10-30 ENCOUNTER — Telehealth: Payer: Self-pay | Admitting: Oncology

## 2019-10-30 NOTE — Telephone Encounter (Signed)
Returned patient's phone call regarding rescheduling an appointment, left a voicemail. 

## 2019-10-31 ENCOUNTER — Ambulatory Visit: Payer: Medicaid Other | Admitting: Physical Therapy

## 2019-11-01 ENCOUNTER — Inpatient Hospital Stay: Payer: Medicaid Other | Attending: Medical

## 2019-11-01 ENCOUNTER — Inpatient Hospital Stay (HOSPITAL_BASED_OUTPATIENT_CLINIC_OR_DEPARTMENT_OTHER): Payer: Medicaid Other | Admitting: Oncology

## 2019-11-01 ENCOUNTER — Telehealth: Payer: Self-pay | Admitting: Oncology

## 2019-11-01 ENCOUNTER — Encounter: Payer: Self-pay | Admitting: General Practice

## 2019-11-01 ENCOUNTER — Other Ambulatory Visit: Payer: Self-pay

## 2019-11-01 ENCOUNTER — Inpatient Hospital Stay: Payer: Medicaid Other

## 2019-11-01 ENCOUNTER — Other Ambulatory Visit: Payer: Self-pay | Admitting: Oncology

## 2019-11-01 VITALS — BP 123/75 | HR 87 | Temp 98.3°F | Resp 18 | Wt 147.8 lb

## 2019-11-01 DIAGNOSIS — C50919 Malignant neoplasm of unspecified site of unspecified female breast: Secondary | ICD-10-CM

## 2019-11-01 DIAGNOSIS — C50312 Malignant neoplasm of lower-inner quadrant of left female breast: Secondary | ICD-10-CM

## 2019-11-01 DIAGNOSIS — C7951 Secondary malignant neoplasm of bone: Secondary | ICD-10-CM | POA: Diagnosis not present

## 2019-11-01 DIAGNOSIS — F411 Generalized anxiety disorder: Secondary | ICD-10-CM

## 2019-11-01 DIAGNOSIS — Z5112 Encounter for antineoplastic immunotherapy: Secondary | ICD-10-CM | POA: Diagnosis present

## 2019-11-01 DIAGNOSIS — C770 Secondary and unspecified malignant neoplasm of lymph nodes of head, face and neck: Secondary | ICD-10-CM

## 2019-11-01 DIAGNOSIS — C7931 Secondary malignant neoplasm of brain: Secondary | ICD-10-CM | POA: Diagnosis not present

## 2019-11-01 DIAGNOSIS — Z95828 Presence of other vascular implants and grafts: Secondary | ICD-10-CM

## 2019-11-01 DIAGNOSIS — D696 Thrombocytopenia, unspecified: Secondary | ICD-10-CM

## 2019-11-01 LAB — COMPREHENSIVE METABOLIC PANEL
ALT: 23 U/L (ref 0–44)
AST: 41 U/L (ref 15–41)
Albumin: 3.5 g/dL (ref 3.5–5.0)
Alkaline Phosphatase: 85 U/L (ref 38–126)
Anion gap: 12 (ref 5–15)
BUN: 10 mg/dL (ref 6–20)
CO2: 27 mmol/L (ref 22–32)
Calcium: 8.8 mg/dL — ABNORMAL LOW (ref 8.9–10.3)
Chloride: 104 mmol/L (ref 98–111)
Creatinine, Ser: 0.82 mg/dL (ref 0.44–1.00)
GFR calc Af Amer: >60 mL/min (ref >60)
GFR calc non Af Amer: >60 mL/min (ref >60)
Glucose, Bld: 92 mg/dL (ref 70–99)
Potassium: 3.5 mmol/L (ref 3.5–5.1)
Sodium: 143 mmol/L (ref 135–145)
Total Bilirubin: 0.5 mg/dL (ref 0.3–1.2)
Total Protein: 6.6 g/dL (ref 6.5–8.1)

## 2019-11-01 LAB — CBC WITH DIFFERENTIAL/PLATELET
Abs Immature Granulocytes: 0 10*3/uL (ref 0.00–0.07)
Basophils Absolute: 0 10*3/uL (ref 0.0–0.1)
Basophils Relative: 1 %
Eosinophils Absolute: 0 10*3/uL (ref 0.0–0.5)
Eosinophils Relative: 1 %
HCT: 34.1 % — ABNORMAL LOW (ref 36.0–46.0)
Hemoglobin: 10.9 g/dL — ABNORMAL LOW (ref 12.0–15.0)
Immature Granulocytes: 0 %
Lymphocytes Relative: 23 %
Lymphs Abs: 0.6 10*3/uL — ABNORMAL LOW (ref 0.7–4.0)
MCH: 28.1 pg (ref 26.0–34.0)
MCHC: 32 g/dL (ref 30.0–36.0)
MCV: 87.9 fL (ref 80.0–100.0)
Monocytes Absolute: 0.3 10*3/uL (ref 0.1–1.0)
Monocytes Relative: 13 %
Neutro Abs: 1.5 10*3/uL — ABNORMAL LOW (ref 1.7–7.7)
Neutrophils Relative %: 62 %
Platelets: 37 10*3/uL — ABNORMAL LOW (ref 150–400)
RBC: 3.88 MIL/uL (ref 3.87–5.11)
RDW: 15.2 % (ref 11.5–15.5)
WBC: 2.5 10*3/uL — ABNORMAL LOW (ref 4.0–10.5)
nRBC: 0 % (ref 0.0–0.2)

## 2019-11-01 MED ORDER — SODIUM CHLORIDE 0.9% FLUSH
10.0000 mL | INTRAVENOUS | Status: DC | PRN
  Administered 2019-11-01: 10 mL
  Filled 2019-11-01: qty 10

## 2019-11-01 MED ORDER — SODIUM CHLORIDE 0.9 % IV SOLN
Freq: Once | INTRAVENOUS | Status: AC
  Administered 2019-11-01: 09:00:00 via INTRAVENOUS
  Filled 2019-11-01: qty 250

## 2019-11-01 MED ORDER — DENOSUMAB 120 MG/1.7ML ~~LOC~~ SOLN
120.0000 mg | Freq: Once | SUBCUTANEOUS | Status: AC
  Administered 2019-11-01: 120 mg via SUBCUTANEOUS

## 2019-11-01 MED ORDER — HEPARIN SOD (PORK) LOCK FLUSH 100 UNIT/ML IV SOLN
500.0000 [IU] | Freq: Once | INTRAVENOUS | Status: AC | PRN
  Administered 2019-11-01: 500 [IU]
  Filled 2019-11-01: qty 5

## 2019-11-01 MED ORDER — DENOSUMAB 120 MG/1.7ML ~~LOC~~ SOLN
SUBCUTANEOUS | Status: AC
  Filled 2019-11-01: qty 1.7

## 2019-11-01 MED ORDER — DIPHENHYDRAMINE HCL 25 MG PO CAPS: 25.0000 mg | ORAL_CAPSULE | Freq: Once | ORAL | Status: DC

## 2019-11-01 MED ORDER — ACETAMINOPHEN 325 MG PO TABS
ORAL_TABLET | ORAL | Status: AC
  Filled 2019-11-01: qty 2

## 2019-11-01 MED ORDER — ACETAMINOPHEN 325 MG PO TABS
650.0000 mg | ORAL_TABLET | Freq: Once | ORAL | Status: AC
  Administered 2019-11-01: 650 mg via ORAL

## 2019-11-01 MED ORDER — TRASTUZUMAB-DKST CHEMO 150 MG IV SOLR
6.0000 mg/kg | Freq: Once | INTRAVENOUS | Status: AC
  Administered 2019-11-01: 399 mg via INTRAVENOUS
  Filled 2019-11-01: qty 19

## 2019-11-01 NOTE — Telephone Encounter (Signed)
Appointments scheduled per 12/2 schedule message and los. Patient taken schedule in infusion.

## 2019-11-01 NOTE — Progress Notes (Signed)
[facility] CSW Progress Notes  Request received from medical oncologist to explore options for SNF rehab for patient to address current issues with balance/falls.  Left VM for patient w my contact information, requested call back to explore options/needs/resources.  Edwyna Shell, LCSW Clinical Social Worker Phone:  [phone] Cell:  [phone]

## 2019-11-01 NOTE — Progress Notes (Signed)
[name] CSW Progress Notes  Request from medical oncologist to reach out to patient regarding her request for further help with rehab placement.  Called patient "[name], my legs feel like noodles, [name]"  "[name]"  Medicaid only approves 6 total outpatient PT visits annually - she has completed 3 visits.  Worries that she has to go up/donw stairs, push rollator through thick grass in her yard.  "[name], people see it in me."    Feels she needs help w "steadying her gait, [name]"  Wamnts more outpatient PT visits or placement in facility that includes rehab time.  Message sent to outpatient PT who is currently working with her - will need to determine the level of care needed and investigate her options.  Patient declines referral for Medicaid [facility] - "that's not what I need, I need more rehab time."  Is reluctant to consider any kind of facility placement - states she "[name]", however, patient has told oncologist that she would like to be "in rehab for 2 - 3 weeks to get stronger."    CSW will get more information from her outpatient PT team in order to determine next steps, will keep patient advised.  Edwyna Shell, LCSW Clinical Social Worker Phone:  [phone] Cell:  [phone]

## 2019-11-01 NOTE — Progress Notes (Signed)
[facility]  [phone] [phone]     ID: [name] DOB: 09/06/1961  MR#: [id number]  EPP#:[id number]  Patient Care Team: Charlott Rakes, MD as PCP - General (Family Medicine) Kyung Rudd, MD as Consulting Physician (Radiation Oncology) Erline Levine, MD as Consulting Physician (Neurosurgery) Corena Pilgrim, MD as Consulting Physician (Psychiatry) Mickeal Skinner Acey Lav, MD as Consulting Physician (Psychiatry) Nehemiah Settle, MD as Referring Physician (Plastic Surgery) Irene Limbo, MD as Consulting Physician (Plastic Surgery) Dillingham, Loel Lofty, DO as Attending Physician (Plastic Surgery)   CHIEF COMPLAINT: Metastatic triple positive breast cancer  CURRENT TREATMENT: trastuzumab, denosumab, anastrozole  INTERVAL HISTORY: [name] came in for her T-DM1 treatment today.  Generally she has tolerated this well except that it her platelet count has been dropping and today it is down to 37.  This is of particular concern because she has been falling.  She tells me she has had about 4 falls in the last week or 2.  She is getting weekly rehab through the neuro rehab group but she wonders if she would do better if she had more intensive treatment perhaps in a skilled nursing facility for 2 to 3 weeks.  She does have a Rollator which she uses varying constantly.  She is tolerating denosumab and anastrozole without any unusual side effects  REVIEW OF SYSTEMS: [name] is not having any unusual headaches visual changes nausea or vomiting.  Balance is the issue.  She denies cough phlegm production or pleurisy.  She has not had intercurrent fevers or diarrhea.  She is planning to spend Christmas at her son's in [place].  That son had the [place] virus about 6 weeks ago.  A daughter-in-law, wife of the other son, also had Covid.  The patient's ex-husband also had the disease.  In her own house as she has a room by herself and she is appropriately distancing.   A detailed review of systems today was otherwise stable  BREAST CANCER HISTORY: From the original intake note:  [name] was aware of a "lemon sized lump in" her left axilla for about a year before bringing it to medical attention. By then she had developed left breast and left axillary swelling (June 2016). She presented to the local emergency room and had a chest CT scan 06/06/2015 which showed a nodule in the left breast measuring 0.9 cm and questionable left axillary adenopathy. She then proceeded to bilateral diagnostic mammography and left breast ultrasonography 06/19/2015. There were no prior films for comparison (last mammography 12 years prior).. The breast density was category C. Mammography showed in the left breast upper inner quadrant a 7 cm area including a small mass and significant pleomorphic calcifications. Ultrasonography defined the mass as measuring 1.2 cm. The left axilla appeared unremarkable. There was significant skin edema.  Biopsy of the left breast mass 06/19/2015 showed (SP [id number]) an invasive ductal carcinoma, grade 2, estrogen receptor 83% positive, progesterone receptor 26% positive, and HER-2 amplified by immunohistochemstry with a 3+ reading. The patient had biopsies of a separate area in the left breast August of the same year and this showed atypical ductal hyperplasia. (SP [id number]).  Accordingly after appropriate discussion on 08/21/2015 the patient proceeded to left mastectomy with left axillary sentinel lymph node sampling, which, since the lymph nodes were positive, extended to the procedure to left axillary lymph node dissection. The pathology (SP [id number]) showed an invasive ductal carcinoma, grade 3, measuring in excess of 9 cm. There were also skin satellites, not contiguous with  the invasive carcinoma. Margins were clear and ample. There was evidence of lymphovascular invasion. A total of 15 lymph nodes were removed, including 5 sentinel lymph nodes, all of which  were positive, so that the final total was 14 out of 15 lymph nodes involved by tumor. There was evidence of extranodal extension. The final pathology was pT4b pN2a, stage IIIB  CA-27-29 and CEA 09/19/2015 were non-informative October 2016.  Unfortunately CT scans of the chest abdomen and pelvis 09/16/2015 showed bony metastases to the ri/ght scapula, left iliac crest, and also L4 and T-spine. There were questionable liver cysts which on repeat CT scan 03/02/2016 appear to be a little bit more well-defined, possibly a little larger. There were also some possible right upper lobe lung lesions.  Adjuvant treatment consisted of docetaxel, trastuzumab and pertuzumab, with the final (6th) docetaxel dose given 02/11/2016. She continues on trastuzumab and pertuzumab, with the 11th cycle given 05/05/2016. Echocardiogram 02/26/2016 showed an ejection fraction of 55%. She receives denosumab/Xgeva every 4 weeks.. She also receives radiation, started 06/09/201, to be completed 06/26/2016.  Her subsequent history is as detailed below   PAST MEDICAL HISTORY: Past Medical History:  Diagnosis Date   Alcohol abuse    Anemia    during chemo   Anxiety    At age 34   Arthritis Dx 2010   Bipolar disorder ([facility])    Bronchitis    Cancer ([facility])    breast mets to brain   Chronic pain    Complication of anesthesia    Depression    Family history of adverse reaction to anesthesia    MOther had PONV   Fibromyalgia Dx 2005   GERD (gastroesophageal reflux disease)    Headache    hx  migraines   Lymphedema of left arm    Opiate dependence ([facility])    PONV (postoperative nausea and vomiting)    Port-A-Cath in place    PTSD (post-traumatic stress disorder)      PAST SURGICAL HISTORY: Past Surgical History:  Procedure Laterality Date   APPLICATION OF CRANIAL NAVIGATION N/A 08/14/2016   Procedure: APPLICATION OF CRANIAL NAVIGATION;  Surgeon: Erline Levine, MD;  Location: [facility];   Service: Neurosurgery;  Laterality: N/A;   BREAST RECONSTRUCTION Left    with silicone implant   COLONOSCOPY W/ POLYPECTOMY     CRANIOTOMY N/A 08/14/2016   Procedure: CRANIOTOMY TUMOR EXCISION WITH Lucky Rathke;  Surgeon: Erline Levine, MD;  Location: [facility];  Service: Neurosurgery;  Laterality: N/A;   FIBULA FRACTURE SURGERY Left    MASTECTOMY Left    RADIOLOGY WITH ANESTHESIA N/A 07/23/2016   Procedure: MRI OF BRAIN WITH AND WITHOUT;  Surgeon: Medication Radiologist, MD;  Location: [facility];  Service: Radiology;  Laterality: N/A;   RADIOLOGY WITH ANESTHESIA N/A 09/08/2016   Procedure: MRI OF BRAIN WITH AND WITHOUT CONTRAST;  Surgeon: Medication Radiologist, MD;  Location: [facility];  Service: Radiology;  Laterality: N/A;   RADIOLOGY WITH ANESTHESIA N/A 12/10/2016   Procedure: MRI OF BRAIN WITH AND WITHOUT;  Surgeon: Medication Radiologist, MD;  Location: [facility];  Service: Radiology;  Laterality: N/A;   RADIOLOGY WITH ANESTHESIA N/A 03/02/2017   Procedure: MRI of BRAIN W and W/OUT CONTRAST;  Surgeon: Medication Radiologist, MD;  Location: [facility];  Service: Radiology;  Laterality: N/A;   RADIOLOGY WITH ANESTHESIA N/A 07/29/2017   Procedure: RADIOLOGY WITH ANESTHESIA MRI OF BRAIN WITH AND WITHOUT CONTRAST;  Surgeon: Radiologist, Medication, MD;  Location: [facility];  Service: Radiology;  Laterality:  N/A;   RADIOLOGY WITH ANESTHESIA N/A 12/07/2017   Procedure: MRI WITH ANESTHESIA OF BRAIN WITH AND WITHOUT CONTRAST;  Surgeon: Radiologist, Medication, MD;  Location: [facility];  Service: Radiology;  Laterality: N/A;   RADIOLOGY WITH ANESTHESIA N/A 04/07/2018   Procedure: MRI OF BRAIN WITH AND WITHOUT CONTRAST;  Surgeon: Radiologist, Medication, MD;  Location: [facility];  Service: Radiology;  Laterality: N/A;   RADIOLOGY WITH ANESTHESIA N/A 08/23/2018   Procedure: MRI WITH ANESTHESIA OF THE BRAIN WITH AND WITHOUT;  Surgeon: Radiologist, Medication, MD;  Location: [facility];  Service: Radiology;  Laterality: N/A;    RADIOLOGY WITH ANESTHESIA N/A 01/24/2019   Procedure: MRI OF BRAIN WITH AND WITHOUT CONTRAST;  Surgeon: Radiologist, Medication, MD;  Location: [facility];  Service: Radiology;  Laterality: N/A;   RADIOLOGY WITH ANESTHESIA N/A 07/06/2019   Procedure: MRI WITH ANESTHESIA OF BRAIN WITH AND WITHOUT CONTRAST;  Surgeon: Radiologist, Medication, MD;  Location: [facility];  Service: Radiology;  Laterality: N/A;   right power port placement Right      FAMILY HISTORY Family History  Problem Relation Age of Onset   Diabetes Mother    Bipolar disorder Mother    CAD Father    The patient's father still living, age 37, in [place]. He had prostate cancer at some point in the past. The patient's mother died at age 72 from complications of diabetes. The patient had no brothers, 2 sisters. A paternal grandmother had lung cancer in the setting of tobacco abuse. There is no other history of cancer in the family to her knowledge  GYNECOLOGIC HISTORY:  No LMP recorded. Patient has had an ablation. Menarche approximately age 58. First live birth in 72. The patient is GX P2. She underwent endometrial ablation in 2016.  SOCIAL HISTORY: Updated August 2019 The patient is originally from [place]. She has lived in [place] before but more recently was in [place]. She is back here because she could not afford her rent in [place]. She is living here and a temporary situation. She is divorced. Her 2 children are [name] who lives in [place] and works as a Development worker, community, and [name] who also lives in [place] and works as a Catering manager. The patient has a grandchild, Arelia Longest, 3 years old as of July 2019, living in [place] with his mother. The patient also has a grandson born in February 2019, who also lives in [place]. The patient has not established herself with a local church yet.    ADVANCED DIRECTIVES: Not in place; at the 06/03/2016 visit the patient was given the appropriate forms to complete and  notarize at her discretion   HEALTH MAINTENANCE: Social History   Tobacco Use   Smoking status: Current Every Day Smoker    Packs/day: 0.25    Years: 43.00    Pack years: 10.75    Types: Cigarettes   Smokeless tobacco: Never Used  Substance Use Topics   Alcohol use: No    Comment: no ETOH since 08/22/12   Drug use: No    Comment: states she's in recovery program for 7 years     Colonoscopy:  PAP:  Bone density:   Allergies  Allergen Reactions   Demerol [Meperidine Hcl] Itching and Nausea And Vomiting   Erythromycin Rash    Current Outpatient Medications on File Prior to Visit  Medication Sig Dispense Refill   anastrozole (ARIMIDEX) 1 MG tablet TAKE 1 TABLET BY MOUTH EVERY DAY (Patient taking differently: Take 1 mg by mouth daily. ) 90 tablet  1   Biotin 5 MG TABS Take 5 mg by mouth daily.      Cholecalciferol (VITAMIN D3) 50 MCG (2000 UT) capsule Take 2,000 Units by mouth daily.      cyclobenzaprine (FLEXERIL) 10 MG tablet TAKE 1 TABLET BY MOUTH TWICE A DAY (Patient taking differently: Take 10 mg by mouth 2 (two) times daily. ) 60 tablet 1   cycloSPORINE (RESTASIS) 0.05 % ophthalmic emulsion Place 1 drop into both eyes 2 (two) times daily.     docusate sodium (COLACE) 100 MG capsule Take 100 mg by mouth daily.      fluconazole (DIFLUCAN) 100 MG tablet Take 1 tablet (100 mg total) by mouth daily. 10 tablet 0   fluticasone (FLONASE) 50 MCG/ACT nasal spray SPRAY 2 SPRAYS INTO EACH NOSTRIL EVERY DAY 16 mL 2   gabapentin (NEURONTIN) 300 MG capsule Take 1-2 capsules (300-600 mg total) by mouth See admin instructions. Take 300 mg by mouth in the morning and take 600 mg by mouth at bedtime 90 capsule 0   lamoTRIgine (LAMICTAL) 100 MG tablet Take 100 mg by mouth every morning.     loratadine (CLARITIN) 10 MG tablet TAKE 1 TABLET BY MOUTH EVERY DAY 30 tablet 6   Lurasidone HCl (LATUDA) 60 MG TABS Take 60 mg by mouth daily.     ondansetron (ZOFRAN) 8 MG tablet TAKE 1  TABLET (8 MG TOTAL) BY MOUTH EVERY 8 (EIGHT) HOURS AS NEEDED FOR NAUSEA OR VOMITING. 90 tablet 1   pantoprazole (PROTONIX) 40 MG tablet Take 1 tablet (40 mg total) by mouth daily. Must have office visit for refills 90 tablet 0   traZODone (DESYREL) 100 MG tablet Take 1 tablet (100 mg total) by mouth at bedtime. (Patient taking differently: Take 300 mg by mouth at bedtime. ) 30 tablet 0   Current Facility-Administered Medications on File Prior to Visit  Medication Dose Route Frequency Provider Last Rate Last Dose   denosumab (XGEVA) injection 120 mg  120 mg Subcutaneous Once Causey, Charlestine Massed, NP       diphenhydrAMINE (BENADRYL) capsule 25 mg  25 mg Oral Once Samyiah Halvorsen, Virgie Dad, MD       heparin lock flush 100 unit/mL  500 Units Intracatheter Once PRN Phylliss Strege, Virgie Dad, MD       sodium chloride flush (NS) 0.9 % injection 10 mL  10 mL Intracatheter PRN Hannan Hutmacher, Virgie Dad, MD       trastuzumab-dkst (OGIVRI) 399 mg in sodium chloride 0.9 % 250 mL chemo infusion  6 mg/kg (Treatment Plan Recorded) Intravenous Once Ilena Dieckman, Virgie Dad, MD         OBJECTIVE: Middle-aged white woman examined in the infusion area There were no vitals filed for this visit.There is no height or weight on file to calculate BMI.  For vitals today (11/01/2019) please see the infusion room flowsheet  ECOG FS: 1 - Symptomatic but completely ambulatory   Left periorbital area 11/01/2019      LAB RESULTS: No results found for: LABCA2  CBC    Component Value Date/Time   WBC 2.5 (L) 11/01/2019 0811   RBC 3.88 11/01/2019 0811   HGB 10.9 (L) 11/01/2019 0811   HGB 13.5 07/05/2019 1129   HGB 12.1 10/26/2017 1038   HCT 34.1 (L) 11/01/2019 0811   HCT 35.7 10/26/2017 1038   PLT 37 (L) 11/01/2019 0811   PLT 54 (L) 07/05/2019 1129   PLT 167 10/26/2017 1038   MCV 87.9 11/01/2019 0811   MCV 98.4  10/26/2017 1038   MCH 28.1 11/01/2019 0811   MCHC 32.0 11/01/2019 0811   RDW 15.2 11/01/2019 0811   RDW 13.3  10/26/2017 1038   LYMPHSABS 0.6 (L) 11/01/2019 0811   LYMPHSABS 0.5 (L) 10/26/2017 1038   MONOABS 0.3 11/01/2019 0811   MONOABS 0.1 10/26/2017 1038   EOSABS 0.0 11/01/2019 0811   EOSABS 0.0 10/26/2017 1038   BASOSABS 0.0 11/01/2019 0811   BASOSABS 0.0 10/26/2017 1038   CMP Latest Ref Rng & Units 11/01/2019 10/11/2019 09/19/2019  Glucose 70 - 99 mg/dL 92 186(H) 102(H)  BUN 6 - 20 mg/dL 10 10 8   Creatinine 0.44 - 1.00 mg/dL 0.82 0.92 0.80  Sodium 135 - 145 mmol/L 143 140 140  Potassium 3.5 - 5.1 mmol/L 3.5 3.3(L) 3.6  Chloride 98 - 111 mmol/L 104 103 104  CO2 22 - 32 mmol/L 27 29 27   Calcium 8.9 - 10.3 mg/dL 8.8(L) 9.6 8.9  Total Protein 6.5 - 8.1 g/dL 6.6 6.8 6.8  Total Bilirubin 0.3 - 1.2 mg/dL 0.5 0.4 0.4  Alkaline Phos 38 - 126 U/L 85 94 86  AST 15 - 41 U/L 41 34 40  ALT 0 - 44 U/L 23 17 20      STUDIES: No results found.  ELIGIBLE FOR AVAILABLE RESEARCH PROTOCOL: no  ASSESSMENT: 58 y.o. 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  (2) status post left mastectomy and axillary lymph node dissection  with immediate expander placement 07/18/2015 for an mpT4 pN2,stage IIIB invasive ductal carcinoma, grade 3, with negative margins.  (a) definitive implant exchange to be scheduled in December   METASTATIC DISEASE: October 2016  (3) CT scan of the chest abdomen and pelvis  09/16/2015 shows metastatic lesions in the right scapula, left iliac crest, L4, and T spine. There were questionable liver cysts, with repeat CT scan 03/02/2016 showing possible right upper lobe lung lesions and possibly increased liver lesions  (a) CT scan of the chest 06/17/2016 shows no active disease in the lungs or liver  (b) Bone scan July 2017 showed no evidence of bony metastatic disease   (c) head CT 07/08/2016  showed a cerebellar lesion, confirmed by MRI 07/23/2016, status post craniotomy 08/14/2016, confirming a metastatic deposit which was estrogen and progesterone receptor negative, HER-2 amplified with a signals ratio of 7.16, number per cell 13.25  (d) CA 27-29 is not informative  (4) received docetaxel every 3 weeks 6 together with trastuzumab and pertuzumab, last docetaxel dose 02/11/2016  (5) adjuvant radiation7/03/2016 to 06/26/2016 at [facility]: 1. The Left chest wall was treated to 23.4 Gy in 13 fractions at 1.8 Gy per fraction. 2. The Left chest wall was boosted to 10 Gy in 5 fractions at 2 Gy per fraction. 3. The Left Sclav/PAB was treated to 23.4 Gy in 13 fractions at 1.8 Gy per fraction.  [Note: Including the patient's treatment in [facility] (received 15 fractions per Dr. Maryan Rued near [place], [place]), the patient received 50.4 Gy to the left chest wall and supraclavicular region. ]  (6) started trastuzumab and pertuzumab October 2016, continuing every 3 weeks,  (a) echocardiogram 02/26/2016 showed a well preserved ejection fraction  (b) echocardiogram 07/01/2016 shows an ejection fraction in the 60-65%   (c) pertuzumab discontinued 10/2016 with uncontrolled diarrhea  (d) echocardiogram 11/11/2016 showed an ejection fraction in the 60-65%  (  e) echocardiogram 03/03/2017 shows an ejection fraction of 60-65%  (f) echocardiogram on 05/19/2017 shows an ejection fraction of 55-60%  (g) echocardiogram 09/24/2017 shows the ejection fraction in the 60-65%  (h) echocardiogram 02/14/2018 shows an ejection fraction in the 60-65%  (I) echocardiogram  06/30/2018 shows an ejection fraction in the 55-60%  (m) echocardiogram on 12/08/2018 shows an ejection fraction in the 60-65% range      (7) started denosumab/Xgeva October 2017 given every 4 weeks, transitioned to every 8 weeks beginning 10/11/18 (every 6 weeks while giving TDM1 every 3 weeks)  (8) started anastrozole October 2017   (a) bone  scan 11/10/2016 shows no active disease  (b) chest CT scan 11/10/2016 stable, with no evidence of active disease  (c) chest CT and bone scan 07/02/2017 show no evidence of active disease  (d) CT scan of the chest with contrast 11/10/2017 shows some left axillary edema, but no evidence of thrombosis or adenopathy in that area, 0.9 cm precarinal lymph and 0.7 cm right upper lobe nodule node; bone lesions stable  (e) CT of the chest 05/04/2018 shows a 1.4 cm right lower paratracheal node which is slightly increased and a new right prevascular mediastinal node measuring 0.7 cm.  Bone lesions are stable.  (f) chest CT on 07/01/2018 shows no definite findings of metastatic disease in the thorax. Previously noted borderline enlarged low right paratracheal lymph node is stable to slightly decreased in size   (e) chest CT on 12/30/2018 notes mild increase in right paratracheal adneopathy, recommended PET scan.  Pet scan on 01/19/2019 showed a hypermetabolic and enlarged right paratracheal lymph node consistent with breast cancer recurrence.  (f)Trastuzumab discontinued due to February 2020 scans   (g) TDM-1 started on 01/31/2019 given every 21 days.  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  (a) brain irradiation10/19/17-11/08/17: Whole brain/ 35 Gy in 14 fractions   (b) repeat brain MRI obtained 12/10/2016  shows no active disease in the brain  (c) repeat brain MRI 03/02/2017 shows no evidence of residual or recurrent disease  (d) repeat brain MRI 07/29/2017 shows no evidence of residual or recurrent disease  (e) repeat brain MRI 12/07/2017 shows no evidence of disease recurrence.  There is progressive white matter change secondary to prior treatment.  (f) repeat brain MRI 04/07/2018 showed no evidence of disease\  (g) repeat brain MRI on 08/23/2018 shows no evidence of disease  (h) repeat brain MRI on 01/25/2019 shows no evidence of disease   PLAN: [name] has been on T-DM1 8 months.  Her metastatic disease remains well controlled but her platelets have been dropping and she has been having falls.  She has some periorbital bruises as imaged above.  I do not think it safe to continue T-DM1 at this point.  I am switching her to Herceptin which will allow her platelets to come back up.  She is already scheduled for repeat echocardiogram 11/10/2019.  I have  asked her to use her Rollator all the time.  She would like to have more intensive physical therapy and I am alerting our social workers to see what we can arrange for her.  Ideally she would be in a rehab facility 2 to 3 weeks until her balance improves.  She is going to be visiting family in [place] for the holidays.  We discussed appropriate pandemic precautions  Otherwise she will see me again with her next dose of trastuzumab which will be in 3 weeks  She knows to call for any other issues that may develop before the next visit.  Wilber Bihari, NP   11/01/19 9:25 AM Medical Oncology and Hematology [facility] [address] [place], [place] [address] Tel. [phone]    Fax. [phone]

## 2019-11-02 LAB — CANCER ANTIGEN 27.29: CA 27.29: 8.6 U/mL (ref 0.0–38.6)

## 2019-11-06 ENCOUNTER — Ambulatory Visit: Payer: Medicaid Other

## 2019-11-10 ENCOUNTER — Other Ambulatory Visit (HOSPITAL_COMMUNITY): Payer: Medicaid Other

## 2019-11-10 ENCOUNTER — Other Ambulatory Visit: Payer: Self-pay

## 2019-11-10 ENCOUNTER — Ambulatory Visit: Payer: Medicaid Other | Attending: Adult Health | Admitting: Physical Therapy

## 2019-11-10 ENCOUNTER — Encounter: Payer: Self-pay | Admitting: Physical Therapy

## 2019-11-10 DIAGNOSIS — R2689 Other abnormalities of gait and mobility: Secondary | ICD-10-CM | POA: Diagnosis present

## 2019-11-10 DIAGNOSIS — M6281 Muscle weakness (generalized): Secondary | ICD-10-CM | POA: Insufficient documentation

## 2019-11-10 DIAGNOSIS — R2681 Unsteadiness on feet: Secondary | ICD-10-CM | POA: Diagnosis present

## 2019-11-10 NOTE — Therapy (Signed)
[facility] [address] [place], [place], [address] Phone: [phone]   Fax:  [phone]  Physical Therapy Treatment  Patient Details  Name: [name] MRN: [id number] Date of Birth: [birth year] Referring Provider (PT): Wilber Bihari, NP   Encounter Date: 11/10/2019  PT End of Session - 11/10/19 0910    Visit Number  11    Number of Visits  12    Date for PT Re-Evaluation  11/20/19    Authorization Type  Medicaid (per email from Antony Salmon and Trudee Kuster 08/22/2019, >1 medicaid eval is allowed in a year);    Authorization Time Period  08/31/2019-09/20/2019; 09/28/19-10/25/19 (8 visits); 2 visits approved 12/11- 11/21/2019   Authorization - Visit Number  7    Authorization - Number of Visits  8    PT Start Time  5397    PT Stop Time  0803    PT Time Calculation (min)  46 min    Equipment Utilized During Treatment  Gait belt    Activity Tolerance  Patient tolerated treatment well;No increased pain;Patient limited by fatigue    Behavior During Therapy  [facility] for tasks assessed/performed   decreased safety awareness; needs repeated cues for braking technique, hand placement with transfers      Past Medical History:  Diagnosis Date  . Alcohol abuse   . Anemia    during chemo  . Anxiety    At age 6  . Arthritis Dx 2010  . Bipolar disorder ([place])   . Bronchitis   . Cancer ([place])    breast mets to brain  . Chronic pain   . Complication of anesthesia   . Depression   . Family history of adverse reaction to anesthesia    MOther had PONV  . Fibromyalgia Dx 2005  . GERD (gastroesophageal reflux disease)   . Headache    hx  migraines  . Lymphedema of left arm   . Opiate dependence ([place])   . PONV (postoperative nausea and vomiting)   . Port-A-Cath in place   . PTSD (post-traumatic stress disorder)     Past Surgical History:  Procedure Laterality Date  . APPLICATION OF CRANIAL NAVIGATION N/A 08/14/2016   Procedure: APPLICATION OF CRANIAL NAVIGATION;  Surgeon: Erline Levine, MD;  Location: [facility];  Service: Neurosurgery;  Laterality: N/A;  . BREAST RECONSTRUCTION Left    with silicone implant  . COLONOSCOPY W/ POLYPECTOMY    . CRANIOTOMY N/A 08/14/2016   Procedure: CRANIOTOMY TUMOR EXCISION WITH Lucky Rathke;  Surgeon: Erline Levine, MD;  Location: [facility];  Service: Neurosurgery;  Laterality: N/A;  . FIBULA FRACTURE SURGERY Left   . MASTECTOMY Left   . RADIOLOGY WITH ANESTHESIA N/A 07/23/2016   Procedure: MRI OF BRAIN WITH AND WITHOUT;  Surgeon: Medication Radiologist, MD;  Location: [facility];  Service: Radiology;  Laterality: N/A;  . RADIOLOGY WITH ANESTHESIA N/A 09/08/2016   Procedure: MRI OF BRAIN WITH AND WITHOUT CONTRAST;  Surgeon: Medication Radiologist, MD;  Location: [facility];  Service: Radiology;  Laterality: N/A;  . RADIOLOGY WITH ANESTHESIA N/A 12/10/2016   Procedure: MRI OF BRAIN WITH AND WITHOUT;  Surgeon: Medication Radiologist, MD;  Location: [facility];  Service: Radiology;  Laterality: N/A;  . RADIOLOGY WITH ANESTHESIA N/A 03/02/2017   Procedure: MRI of BRAIN W and W/OUT CONTRAST;  Surgeon: Medication Radiologist, MD;  Location: [facility];  Service: Radiology;  Laterality: N/A;  . RADIOLOGY WITH ANESTHESIA N/A 07/29/2017   Procedure: RADIOLOGY WITH ANESTHESIA MRI  OF BRAIN WITH AND WITHOUT CONTRAST;  Surgeon: Radiologist, Medication, MD;  Location: [facility];  Service: Radiology;  Laterality: N/A;  . RADIOLOGY WITH ANESTHESIA N/A 12/07/2017   Procedure: MRI WITH ANESTHESIA OF BRAIN WITH AND WITHOUT CONTRAST;  Surgeon: Radiologist, Medication, MD;  Location: [facility];  Service: Radiology;  Laterality: N/A;  . RADIOLOGY WITH ANESTHESIA N/A 04/07/2018   Procedure: MRI OF BRAIN WITH AND WITHOUT CONTRAST;  Surgeon: Radiologist, Medication, MD;  Location: [facility];  Service: Radiology;  Laterality: N/A;  . RADIOLOGY WITH ANESTHESIA N/A 08/23/2018   Procedure: MRI WITH ANESTHESIA OF THE BRAIN WITH AND WITHOUT;   Surgeon: Radiologist, Medication, MD;  Location: [facility];  Service: Radiology;  Laterality: N/A;  . RADIOLOGY WITH ANESTHESIA N/A 01/24/2019   Procedure: MRI OF BRAIN WITH AND WITHOUT CONTRAST;  Surgeon: Radiologist, Medication, MD;  Location: [facility];  Service: Radiology;  Laterality: N/A;  . RADIOLOGY WITH ANESTHESIA N/A 07/06/2019   Procedure: MRI WITH ANESTHESIA OF BRAIN WITH AND WITHOUT CONTRAST;  Surgeon: Radiologist, Medication, MD;  Location: [facility];  Service: Radiology;  Laterality: N/A;  . right power port placement Right     There were no vitals filed for this visit.  Subjective Assessment - 11/10/19 0721    Subjective  Have had at least 2 falls since the last time I was here.  "Girl, I am in bad shape."  Usually the steps are a problem.    Pertinent History  Per [employer] lymphedema eval 07/2019:  Treated here in March of last year currently with a reid sleeve, day sleeve and compression pump. Lt mastectomy and ALND with reconstruction 07/08/15 for T4N2 stage IIIB IDC. Metastatic disease in the brain s/p craniotomy 08/14/16. Chemo completed in 2017 Trastuzumab and Pertuzumab, docetaxel. Radiation completed 2017. Stable bone lesions to the Rt scapula, Lt iliac crest, L4, Tspine. Also probable in the liver and lungs. Pt is also bipolar and a recovered drug and alchohol user.  Current treatment TDM-1, denosumab and anastrozole.    Limitations  Other (comment)    Patient Stated Goals  To get more stability back in legs    Currently in Pain?  No/denies                    Therapeutic Exercise     [facility] Adult PT Treatment/Exercise - 11/10/19 0001      Ambulation/Gait   Ambulation/Gait  Yes    Ambulation/Gait Assistance  5: Supervision;4: Min guard    Ambulation/Gait Assistance Details  bumps into multiple objects on the left.  Cues to slow gait pace and foot clearance.    Ambulation Distance (Feet)  345 Feet    Assistive device  Rollator    Gait Pattern  Step-through  pattern;Decreased stride length;Shuffle;Narrow base of support;Poor foot clearance - left;Poor foot clearance - right    Ambulation Surface  Level;Indoor      Pt ambulates 330 ft in 2:30 with rollator.    Access Code: [email or web address]  URL: [email or web address]  Date: 11/10/2019  Prepared by: Mady Haagensen   Pt performed the following Exercises, which were added to HEP:  Sit to Stand - 10 reps - 2 sets - 1x daily - 7x weekly (from mat, cues to stand upright ) Seated March - 10 reps - 1-2 sets - 1x daily - 5x weekly Seated Ankle Dorsiflexion with Resistance - 10 reps - 1-2 sets - 1x daily - 5x weekly (red theraband) Standing Hip Abduction - 10 reps - 1 sets -  1x daily - 5x weekly (cues for foot positioning) Standing Hip Extension - 10 reps - 1 sets - 1x daily - 5x weekly Heel Toe Raises with Counter Support - 10 reps - 1 sets - 1x daily - 5x weekly (cues for 3 sec hold) Alternating Step Taps with Counter Support - 10 reps - 1-2 sets - 1x daily - 5x weekly    Also performed standing marching x 8 reps, with 1 UE support (pt feels unsteady)  Seated foot pedaler, minimal resistance, forward x 2 minutes, back x 30 seconds.  Pt requires 2 seated rest breaks during standing exercises, due to legs feeling shaky.    Self Care: Discussed importance of continued activity during the day (as pt reports she goes to work site for the morning, then upon return home after noon, she typically stays in the bed the rest of the day, except for bathroom breaks).  Discussed walking as part of HEP, discussed being out of bed and sitting up in other chairs.  Discussed importance of consistent HEP performance.  Discussed pt's remaining POC in December and plans to check goals, reassess POC next visit.     PT Education - 11/10/19 0908    Education Details  Updated HEP; walking program daily (2 minutes, 3 reps per day).  Activity recommendations-for patient to stay out of bed during the day.     Person(s) Educated  Patient    Methods  Explanation;Demonstration;Verbal cues;Handout    Comprehension  Verbalized understanding;Returned demonstration;Verbal cues required;Need further instruction       PT Short Term Goals - 09/18/19 0810      PT SHORT TERM GOAL #1   Title  Pt will be independent with HEP to address balance, strength, and gait.  TARGET 3 weeks (09/22/2019)    Baseline  Initiated [facility] HEP, with pt needing cues for correct technique of exercises    Time  3    Period  Weeks    Status  Partially Met    Target Date  09/22/19      PT SHORT TERM GOAL #2   Title  Pt will improve TUG score to less than or equal to 13.5 seconds for decreased fall risk.    Baseline  TUG score 17.19 seconds (>13.5 seconds indicates increased fall risk) at eval; 13.03 sec at best 09/18/2019    Time  3    Period  Weeks    Status  Achieved    Target Date  09/22/19      PT SHORT TERM GOAL #3   Title  Pt will verbalize fall prevention in home envrionment.    Baseline  At fall risk per TUG, DGI scores    Time  3    Period  Weeks    Status  Achieved    Target Date  09/22/19        PT Long Term Goals - 11/10/19 1601      PT LONG TERM GOAL #1   Title  Pt will be independent with progression of balance exercise program for decreased fall risk.  TARGET 11/14/2019 (Updated, extended target, as pt has not been to therapy since 10/17/2019)    Baseline  No formal balance HEP    Time  7    Period  Weeks    Status  On-going      PT LONG TERM GOAL #2   Title  Pt will improve DGI score to at least 16/24 for decreased fall risk.  Baseline  DGI score 11/24 (Scores <19/24 indicate increased fall risk)    Time  7    Status  On-going      PT LONG TERM GOAL #3   Title  Pt will improve gait velocity to at least 2.3 ft/sec for improved gait efficiency and safety.    Baseline  Gait velocity 1.95 ft/sec (1.31-2.62 f/tsec indicates limited community ambulator)    Time  7    Period  Weeks    Status   On-going      PT LONG TERM GOAL #4   Title  Pt will negotiate 1 curb step to simulate step up into home, no rail, independentlyf or improved safety with curbs/home entry.    Baseline  Pt reports one near fall with curb step; unable to perform stepping over obstacle without stopping, SLS <1 second each leg    Time  7    Period  Weeks    Status  On-going            Plan - 11/10/19 0913    Clinical Impression Statement  Pt has not been in clinic for PT sessions since 10/17/2019 (due to no-show, cancellation on pt's part and due to clinician out sick).  She reports feeling very weak and having had at least 2 falls since last PT visit.  She reports not using rollator on outdoor surfaces, having difficultyw ith steps and generally staying in the bed from the afternoon on, each day.  Pt has lost her previous HEP.  Focused time today updating HEP (in [place]) with seated and standing exercises for strengthening as well as addition of walking program in home with rollator.  Educated patient on need to be more active during the day and not spending as much time in bed in order to work on improved activity tolerance, endurance and general strength.  Pt continues to demonstrate decreased safety awareness with rollator, walking quickly and bumping into obstacles on L side.  Able to improve briefly with verbal cues, then returns to quicker pattern of gait.    Personal Factors and Comorbidities  Comorbidity 3+    Comorbidities  See PMH (subjective); metastatic breast cancer, hx of bipolar and anxiety    Examination-Activity Limitations  Stairs   Curbs   Examination-Participation Restrictions  --   Work   Stability/Clinical Decision Making  Stable/Uncomplicated    Rehab Potential  Good    PT Frequency  Other (comment)   1x/wk for 3 weeks, then 2x/wk for 4 weeks   PT Duration  --   7 weeks total POC   PT Treatment/Interventions  ADLs/Self Care Home Management;Neuromuscular re-education;Gait  training;Stair training;Patient/family education;Therapeutic activities;Functional mobility training;Therapeutic exercise;Balance training;DME Instruction    PT Next Visit Plan  Review updated HEP and walking pogram; add balance exercises to HEP as able.  Gait training with rollator on outdoor surfaces, stair negoitation.  CHECK LTGS and add January appts/recert for Medicaid auth    Consulted and Agree with Plan of Care  Patient       Patient will benefit from skilled therapeutic intervention in order to improve the following deficits and impairments:  Abnormal gait, Difficulty walking, Decreased mobility, Decreased strength, Decreased balance  Visit Diagnosis: Muscle weakness (generalized)  Unsteadiness on feet  Other abnormalities of gait and mobility     Problem List Patient Active Problem List   Diagnosis Date Noted  . Brain metastases ([place]) 11/01/2019  . Thrombocytopenia ([place]) 08/09/2019  . Port-A-Cath in place 06/20/2019  .  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 S/P mastectomy, left 12/09/2018  . S/P breast reconstruction, left 12/09/2018  . S/P radiation therapy 12/09/2018  . GERD (gastroesophageal reflux disease) 12/10/2017  . Edema 11/09/2017  . Sensorineural hearing loss (SNHL) of both ears 11/05/2017  . Vitamin D deficiency 01/18/2017  . Bipolar I disorder, most recent episode depressed ([place]) 12/08/2016  . Adjustment disorder with anxiety 12/06/2016  . Metastatic breast cancer ([place])   . History of cancer metastatic to brain 07/27/2016  . Iron deficiency anemia 06/26/2016  . Bone metastases ([place]) 06/03/2016  . Primary cancer of lower-inner quadrant of left female breast ([place]) 06/01/2016  . Current smoker 03/28/2015  . Seasonal allergies 03/28/2015  . Anxiety state 02/28/2015  . Fibromyalgia 02/28/2015  . Family history of diabetes mellitus 02/28/2015  . H/O alcohol abuse     Daelynn Blower W. 11/10/2019, 9:22 AM  Frazier Butt., PT  [facility] [address] [place], [place], [address] Phone: [phone]   Fax:  [phone]  Name: Carmine Youngberg MRN: [id number] Date of Birth: 05/10/1961

## 2019-11-10 NOTE — Patient Instructions (Signed)
Access Code: [email or web address]  URL: [email or web address]  Date: 11/10/2019  Prepared by: Mady Haagensen   Exercises Sit to Stand - 10 reps - 2 sets - 1x daily - 7x weekly Seated March - 10 reps - 1-2 sets - 1x daily - 5x weekly Seated Ankle Dorsiflexion with Resistance - 10 reps - 1-2 sets - 1x daily - 5x weekly Standing Hip Abduction - 10 reps - 1 sets - 1x daily - 5x weekly Standing Hip Extension - 10 reps - 1 sets - 1x daily - 5x weekly Heel Toe Raises with Counter Support - 10 reps - 1 sets - 1x daily - 5x weekly Alternating Step Taps with Counter Support - 10 reps - 1-2 sets - 1x daily - 5x weekly  Walking around home x 2 minutes, 3 reps per day, with rollator.  SLOWED gait pattern and attention to foot clearance.

## 2019-11-14 ENCOUNTER — Other Ambulatory Visit: Payer: Self-pay

## 2019-11-14 ENCOUNTER — Encounter: Payer: Self-pay | Admitting: Physical Therapy

## 2019-11-14 ENCOUNTER — Ambulatory Visit: Payer: Medicaid Other | Admitting: Physical Therapy

## 2019-11-14 DIAGNOSIS — M6281 Muscle weakness (generalized): Secondary | ICD-10-CM

## 2019-11-14 DIAGNOSIS — R2681 Unsteadiness on feet: Secondary | ICD-10-CM

## 2019-11-14 DIAGNOSIS — R2689 Other abnormalities of gait and mobility: Secondary | ICD-10-CM

## 2019-11-14 NOTE — Therapy (Addendum)
[facility] [address] [place], [place], [address] Phone: [phone]   Fax:  [phone]  Physical Therapy Treatment  Patient Details  Name: [name] MRN: [id number] Date of Birth: 06-Oct-1961 Referring Provider (PT): Wilber Bihari, NP   Encounter Date: 11/14/2019  PT End of Session - 11/14/19 1501    Visit Number  12    Number of Visits  23   per recert [id number]/8315   Date for PT Re-Evaluation  02/12/20    Authorization Type  Medicaid (per email from Antony Salmon and Trudee Kuster 08/22/2019, >1 medicaid eval is allowed in a year); per 11/14/2019 visit-requesting additional Medicaid auth for 2021    Authorization Time Period  08/31/2019-09/20/2019; 09/28/19-10/25/19 (8 visits); 2 visits approved 12/11-12/22/2020    Authorization - Visit Number  8    Authorization - Number of Visits  8    PT Start Time  0846    PT Stop Time  0932    PT Time Calculation (min)  46 min    Equipment Utilized During Treatment  Gait belt    Activity Tolerance  Patient tolerated treatment well;No increased pain;Patient limited by fatigue   Pt requires brief, multiple seated rest breaks between exercises   Behavior During Therapy  [facility] for tasks assessed/performed   decreased safety awareness; needs repeated cues for braking technique, hand placement with transfers      Past Medical History:  Diagnosis Date  . Alcohol abuse   . Anemia    during chemo  . Anxiety    At age 35  . Arthritis Dx 2010  . Bipolar disorder ([place])   . Bronchitis   . Cancer ([place])    breast mets to brain  . Chronic pain   . Complication of anesthesia   . Depression   . Family history of adverse reaction to anesthesia    MOther had PONV  . Fibromyalgia Dx 2005  . GERD (gastroesophageal reflux disease)   . Headache    hx  migraines  . Lymphedema of left arm   . Opiate dependence ([place])   . PONV (postoperative nausea and vomiting)   . Port-A-Cath in place    . PTSD (post-traumatic stress disorder)     Past Surgical History:  Procedure Laterality Date  . APPLICATION OF CRANIAL NAVIGATION N/A 08/14/2016   Procedure: APPLICATION OF CRANIAL NAVIGATION;  Surgeon: Erline Levine, MD;  Location: [place] [facility];  Service: Neurosurgery;  Laterality: N/A;  . BREAST RECONSTRUCTION Left    with silicone implant  . COLONOSCOPY W/ POLYPECTOMY    . CRANIOTOMY N/A 08/14/2016   Procedure: CRANIOTOMY TUMOR EXCISION WITH Lucky Rathke;  Surgeon: Erline Levine, MD;  Location: [place] NEUR[facility] ORS;  Service: Neurosurgery;  Laterality: N/A;  . FIBULA FRACTURE SURGERY Left   . MASTECTOMY Left   . RADIOLOGY WITH ANESTHESIA N/A 07/23/2016   Procedure: MRI OF BRAIN WITH AND WITHOUT;  Surgeon: Medication Radiologist, MD;  Location: [place];  Service: Radiology;  Laterality: N/A;  . RADIOLOGY WITH ANESTHESIA N/A 09/08/2016   Procedure: MRI OF BRAIN WITH AND WITHOUT CONTRAST;  Surgeon: Medication Radiologist, MD;  Location: [facility];  Service: Radiology;  Laterality: N/A;  . RADIOLOGY WITH ANESTHESIA N/A 12/10/2016   Procedure: MRI OF BRAIN WITH AND WITHOUT;  Surgeon: Medication Radiologist, MD;  Location: [facility];  Service: Radiology;  Laterality: N/A;  . RADIOLOGY WITH ANESTHESIA N/A 03/02/2017   Procedure: MRI of BRAIN W and W/OUT CONTRAST;  Surgeon: Medication Radiologist, MD;  Location: [place];  Service: Radiology;  Laterality: N/A;  . RADIOLOGY WITH ANESTHESIA N/A 07/29/2017   Procedure: RADIOLOGY WITH ANESTHESIA MRI OF BRAIN WITH AND WITHOUT CONTRAST;  Surgeon: Radiologist, Medication, MD;  Location: [facility];  Service: Radiology;  Laterality: N/A;  . RADIOLOGY WITH ANESTHESIA N/A 12/07/2017   Procedure: MRI WITH ANESTHESIA OF BRAIN WITH AND WITHOUT CONTRAST;  Surgeon: Radiologist, Medication, MD;  Location: [facility];  Service: Radiology;  Laterality: N/A;  . RADIOLOGY WITH ANESTHESIA N/A 04/07/2018   Procedure: MRI OF BRAIN WITH AND WITHOUT CONTRAST;  Surgeon: Radiologist, Medication, MD;  Location: [place];  Service: Radiology;  Laterality: N/A;  . RADIOLOGY WITH ANESTHESIA N/A 08/23/2018   Procedure: MRI WITH ANESTHESIA OF THE BRAIN WITH AND WITHOUT;  Surgeon: Radiologist, Medication, MD;  Location: [facility];  Service: Radiology;  Laterality: N/A;  . RADIOLOGY WITH ANESTHESIA N/A 01/24/2019   Procedure: MRI OF BRAIN WITH AND WITHOUT CONTRAST;  Surgeon: Radiologist, Medication, MD;  Location: [place];  Service: Radiology;  Laterality: N/A;  . RADIOLOGY WITH ANESTHESIA N/A 07/06/2019   Procedure: MRI WITH ANESTHESIA OF BRAIN WITH AND WITHOUT CONTRAST;  Surgeon: Radiologist, Medication, MD;  Location: [facility];  Service: Radiology;  Laterality: N/A;  . right power port placement Right     There were no vitals filed for this visit.  Subjective Assessment - 11/14/19 0848    Subjective  No falls since last time.  Doing the exercises everyday, just having a hard time with the band exercises for the ankles.    Pertinent History  Per [employer] lymphedema eval 07/2019:  Treated here in March of last year currently with a reid sleeve, day sleeve and compression pump. Lt mastectomy and ALND with reconstruction 07/08/15 for T4N2 stage IIIB IDC. Metastatic disease in the brain s/p craniotomy 08/14/16. Chemo completed in 2017 Trastuzumab and Pertuzumab, docetaxel. Radiation completed 2017. Stable bone lesions to the Rt scapula, Lt iliac crest, L4, Tspine. Also probable in the liver and lungs. Pt is also bipolar and a recovered drug and alchohol user.  Current treatment TDM-1, denosumab and anastrozole.    Limitations  Other (comment)    Patient Stated Goals  To get more stability back in legs    Currently in Pain?  No/denies                       [facility] Adult PT Treatment/Exercise - 11/14/19 0001      Transfers   Transfers  Sit to Stand;Stand to Sit    Sit to Stand  5: Supervision;Without upper extremity assist;From bed;From chair/3-in-1;With upper extremity assist    Sit to Stand Details  Verbal cues  for sequencing;Verbal cues for technique    Sit to Stand Details (indicate cue type and reason)  Cues for hand placement    Five time sit to stand comments   13.21    Stand to Sit  5: Supervision;With upper extremity assist;To chair/3-in-1;To bed;Without upper extremity assist    Stand to Sit Details (indicate cue type and reason)  Verbal cues for sequencing;Verbal cues for technique;Verbal cues for precautions/safety    Stand to Sit Details  Cues for hand placement      Ambulation/Gait   Ambulation/Gait  Yes    Ambulation/Gait Assistance  5: Supervision    Ambulation Distance (Feet)  115 Feet   x 2 reps   Assistive device  Rollator    Gait Pattern  Step-through pattern;Decreased stride length;Shuffle;Narrow base of support;Poor  foot clearance - left;Poor foot clearance - right    Ambulation Surface  Level;Indoor    Gait velocity  13.75 sec = 2.39 ft/sec   rollator   Stairs  Yes    Stairs Assistance  5: Supervision    Stairs Assistance Details (indicate cue type and reason)  Pt has access to one rail at her steps at home; instructed pt to hold to the one rail with both hands with step-to pattern descending.    Stair Management Technique  Forwards;Alternating pattern;Step to pattern;One rail Right   Step to pattern with cues descending   Number of Stairs  4   x 2   Height of Stairs  6    Curb  4: Min assist    Curb Details (indicate cue type and reason)  HHA of PT, with cues for foot placement for improved foot clearance, x 3 reps      Berg Balance Test   Sit to Stand  Able to stand without using hands and stabilize independently    Standing Unsupported  Able to stand safely 2 minutes    Sitting with Back Unsupported but Feet Supported on Floor or Stool  Able to sit safely and securely 2 minutes    Stand to Sit  Sits safely with minimal use of hands    Transfers  Able to transfer safely, minor use of hands    Standing Unsupported with Eyes Closed  Able to stand 10 seconds with  supervision    Standing Ubsupported with Feet Together  Able to place feet together independently and stand for 1 minute with supervision    From Standing, Reach Forward with Outstretched Arm  Can reach forward >12 cm safely (5")    From Standing Position, Pick up Object from Floor  Able to pick up shoe, needs supervision    From Standing Position, Turn to Look Behind Over each Shoulder  Turn sideways only but maintains balance    Turn 360 Degrees  Able to turn 360 degrees safely but slowly   7.06   Standing Unsupported, Alternately Place Feet on Step/Stool  Able to complete >2 steps/needs minimal assist    Standing Unsupported, One Foot in Front  Able to take small step independently and hold 30 seconds    Standing on One Leg  Unable to try or needs assist to prevent fall    Total Score  39      Timed Up and Go Test   TUG  Normal TUG    Normal TUG (seconds)  21.62      Self-Care   Self-Care  Other Self-Care Comments    Other Self-Care Comments   Discussed POC and progress towards goals.  Discussed that PT will be in touch with Education officer, museum at [employer] regarding therapy progression.  Provided patient with informaiton on AHOY exercises on [employer], progression of standing time during the day at home (to increase activity progression).      Exercises   Exercises  Other Exercises    Other Exercises   Reviewed exercises given as updated HEP from last visit.  Pt performs independently, except difficulty with use of red theraband for ankle dorsiflexion.  Modified her ankle dorsiflexion in sitting to be without theraband.             PT Education - 11/14/19 1458    Education Details  Progression towards goals, POC for January and beyond; educated patient in activity level and exercises to increase general  strength and endurance    Person(s) Educated  Patient    Methods  Explanation;Handout    Comprehension  Verbalized understanding       PT Short Term Goals -  09/18/19 0810      PT SHORT TERM GOAL #1   Title  Pt will be independent with HEP to address balance, strength, and gait.  TARGET 3 weeks (09/22/2019)    Baseline  Initiated OTAGO HEP, with pt needing cues for correct technique of exercises    Time  3    Period  Weeks    Status  Partially Met    Target Date  09/22/19      PT SHORT TERM GOAL #2   Title  Pt will improve TUG score to less than or equal to 13.5 seconds for decreased fall risk.    Baseline  TUG score 17.19 seconds (>13.5 seconds indicates increased fall risk) at eval; 13.03 sec at best 09/18/2019    Time  3    Period  Weeks    Status  Achieved    Target Date  09/22/19      PT SHORT TERM GOAL #3   Title  Pt will verbalize fall prevention in home envrionment.    Baseline  At fall risk per TUG, DGI scores    Time  3    Period  Weeks    Status  Achieved    Target Date  09/22/19        PT Long Term Goals - 11/14/19 0920      PT LONG TERM GOAL #1   Title  Pt will be independent with progression of balance exercise program for decreased fall risk.  TARGET 11/14/2019 (Updated, extended target, as pt has not been to therapy since 10/17/2019)    Baseline  No formal balance HEP    Time  7    Period  Weeks    Status  Partially Met      PT LONG TERM GOAL #2   Title  Pt will improve DGI score to at least 16/24 for decreased fall risk.    Baseline  DGI score 11/24 (Scores <19/24 indicate increased fall risk); PT has instructed pt to use rollator at all times due to fall risk, hx of falls; DGI no longer appropriate.    Time  7    Status  Deferred      PT LONG TERM GOAL #3   Title  Pt will improve gait velocity to at least 2.3 ft/sec for improved gait efficiency and safety.    Baseline  Gait velocity 1.95 ft/sec (1.31-2.62 f/tsec indicates limited community ambulator); 2.39 ft/sec 11/14/2019    Time  7    Period  Weeks    Status  Achieved      PT LONG TERM GOAL #4   Title  Pt will negotiate 1 curb step to simulate step  up into home, no rail, independentlyf or improved safety with curbs/home entry.    Baseline  11/14/2019:  requires min assist and cues for safe curb negotiation    Time  7    Period  Weeks    Status  Not Met            Plan - 11/14/19 1505    Clinical Impression Statement  PT assessed pt's LTGs this visit, with pt meeting 1 of 4 LTGs.  Pt partially met LTG 1 for HEP (is performing updated HEP, but needs cues for technique on several exercises).  LTG 2 deferred, as DGI is no longer appropriate, due to pt's fall risk and hx of falls.  LTG 3 met for improved gait velocity with rollator.  LTG 4 not met for curb negoitation, as pt needs min assist for safety with curbs.  With other measures taken today, pt has demonstrated improvement in 5x sit<>stand test (from 14.85 sec to 13.21 sec), improvement in Berg score from 34/56 to 39/56.  Pt's TUG score has increased with use of rollator (to 21.62 sec).  Pt remains at fall risk per Berg, TUG scores.  Pt demo decreased functional strength with 5x sit<>stand, and pt demo decreased standing tolerance, with pt needing seated rest breaks after 2-3 minutes of standing activity.  Pt reports being up out of bed more than at last visit; she has not had any falls in the past week.  She is making slow, steady progress towards goals.  She will benefit from additional skilled PT to address strength, balance, gait training, transfers for improved functional mobility and decreased fall risk.    Personal Factors and Comorbidities  Comorbidity 3+    Comorbidities  See PMH (subjective); metastatic breast cancer, hx of bipolar and anxiety    Examination-Activity Limitations  Stairs   Curbs   Examination-Participation Restrictions  --   Work   Stability/Clinical Decision Making  Stable/Uncomplicated    Rehab Potential  Good    PT Frequency  Other (comment)   1x/wk for 3 weeks, then 2x/wk for 4 weeks   PT Duration  --   7 weeks total POC   PT Treatment/Interventions   ADLs/Self Care Home Management;Neuromuscular re-education;Gait training;Stair training;Patient/family education;Therapeutic activities;Functional mobility training;Therapeutic exercise;Balance training;DME Instruction    PT Next Visit Plan  Submit for reauth for 2021 Medicaid (pt scheduled to return 12/07/2019); update HEP, ask about standing tolerance, walking for endurance at home; use of machines for stregnthening    Consulted and Agree with Plan of Care  Patient       Patient will benefit from skilled therapeutic intervention in order to improve the following deficits and impairments:  Abnormal gait, Difficulty walking, Decreased mobility, Decreased strength, Decreased balance  Visit Diagnosis: Unsteadiness on feet  Other abnormalities of gait and mobility  Muscle weakness (generalized)     Problem List Patient Active Problem List   Diagnosis Date Noted  . Brain metastases ([place]) 11/01/2019  . Thrombocytopenia ([place]) 08/09/2019  . Port-A-Cath in place 06/20/2019  . 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 S/P mastectomy, left 12/09/2018  . S/P breast reconstruction, left 12/09/2018  . S/P radiation therapy 12/09/2018  . GERD (gastroesophageal reflux disease) 12/10/2017  . Edema 11/09/2017  . Sensorineural hearing loss (SNHL) of both ears 11/05/2017  . Vitamin D deficiency 01/18/2017  . Bipolar I disorder, most recent episode depressed ([place]) 12/08/2016  . Adjustment disorder with anxiety 12/06/2016  . Metastatic breast cancer ([place])   . History of cancer metastatic to brain 07/27/2016  . Iron deficiency anemia 06/26/2016  . Bone metastases ([place]) 06/03/2016  . Primary cancer of lower-inner quadrant of left female breast ([place]) 06/01/2016  . Current smoker 03/28/2015  . Seasonal allergies 03/28/2015  . Anxiety state 02/28/2015  . Fibromyalgia 02/28/2015  . Family history of diabetes mellitus 02/28/2015  . H/O alcohol abuse     Kellyanne Ellwanger  W. 11/14/2019, 4:30 PM Frazier Butt., PT  [facility] [address] [place], [place], [address] Phone: [phone]   Fax:  [phone]  Name: [name] MRN: [id number] Date of Birth: [birth year]   New goals for Recert:  PT Short Term Goals - 11/14/19 1631      PT SHORT TERM GOAL #1   Title  Pt will be independent with progression of HEP to address balance, strength, and gait.  TARGET 3 weeks (12/31/2019)    Baseline  Updated HEP at 11/10/2019 visit; pt requires cues for updated HEP    Time  3    Period  Weeks    Status  Revised    Target Date  12/31/19      PT SHORT TERM GOAL #2   Title  Pt will improve 5x sit<>stand test to less than or equal to 11.5 seconds to demonstrate improved lower extremity functional strength.    Baseline  5x sit<>stand:  13.75 sec 11/14/2019    Time  3    Period  Weeks    Status  New    Target Date  12/31/19      PT SHORT TERM GOAL #3   Title  Pt will stand for at least 10 minutes during PT sessions, without sitting breaks, for improved participation in ADLs and household tasks.    Baseline  11/14/2019:  Pt requires seated rest break after 2-3 minutes of standing during PT sessions.    Time  3    Period  Weeks    Status  New    Target Date  12/31/19      PT Long Term Goals - 11/14/19 1643      PT LONG TERM GOAL #1   Title  Pt will be independent with progression of HEP and verbalize plans for continued community fitness upon d/c from PT.  TARGET 01/28/2020    Baseline  Updates to HEP provided at 11/10/2019 visit    Time  7    Period  Weeks    Status  Revised    Target Date  01/28/20      PT LONG TERM GOAL #2   Title  Pt will improve Berg Balance score to at least 45/56 for decreased fall risk.    Baseline  Score 39/56 11/14/2019; scores <45/56 indicate increased fall risk    Time  7    Status  Revised    Target Date  01/28/20      PT LONG TERM GOAL #3   Title  Pt will  improve gait velocity to at least 2.62 ft/sec for improved gait efficiency and safety.    Baseline  2.39 ft/sec 11/14/2019 with rollator    Time  7    Period  Weeks    Status  Revised    Target Date  01/28/20      PT LONG TERM GOAL #4   Title  Pt will negotiate 1 curb step to simulate step up into home, no rail, with supervision, for improved safety with curbs/home entry.    Baseline  11/14/2019:  requires min assist and cues for safe curb negotiation    Time  7    Period  Weeks    Status  Revised      PT LONG TERM GOAL #5   Title  Pt will negotiate at least 4 steps with one handrail with supervision, for improved safety with stair negotiation.    Baseline  min assist for steps 1 handrail 11/14/2019    Time  7    Period  Weeks    Status  New    Target Date  01/28/20  Mady Haagensen, PT 11/14/19 4:50 PM Phone: [phone] Fax: [phone]

## 2019-11-14 NOTE — Patient Instructions (Addendum)
AHOY Exercises on [employer]  -Mondays-Fridays 8 am and 1 pm (you can follow along with the exercises you feel comfortable doing)  Do your exercises EVERYDAY  Walk with your walker in your home [place] for up to 5 minutes at a time, several times a day to build up your endurance.  Ask for HELP when you need it for STAIRS and CURBS for safety to prevent falls.

## 2019-11-15 ENCOUNTER — Inpatient Hospital Stay (HOSPITAL_COMMUNITY): Admission: RE | Admit: 2019-11-15 | Payer: Medicaid Other | Source: Ambulatory Visit

## 2019-11-15 ENCOUNTER — Telehealth: Payer: Self-pay | Admitting: *Deleted

## 2019-11-15 NOTE — Telephone Encounter (Signed)
Pt left VM stating she had to reschedule her ECHO to January and " so I need to reschedule my infusion because I was told I couldn't get the medicine until I got the ECHO "  This RN noted last ECHO was June 2020 with normal readings with pt having stable cardiac function while on herceptin.  Per MD - ok to treat next week with ECHO with current ECHO scheduled in January.  This RN attempted to call pt- received identified VM- message left per above.

## 2019-11-17 ENCOUNTER — Other Ambulatory Visit (HOSPITAL_COMMUNITY): Payer: Medicaid Other

## 2019-11-17 ENCOUNTER — Telehealth: Payer: Self-pay | Admitting: Oncology

## 2019-11-17 ENCOUNTER — Other Ambulatory Visit: Payer: Self-pay | Admitting: Oncology

## 2019-11-17 NOTE — Telephone Encounter (Signed)
Returned patient's phone call regarding rescheduling 01/13 appointments and cancelling 12/22 appointments. 01/13 appointments have moved to 01/12 and due to patient being out of town 12/22 appointments have been cancelled.  Message to provider.

## 2019-11-21 ENCOUNTER — Ambulatory Visit: Payer: Medicaid Other | Admitting: Oncology

## 2019-11-21 ENCOUNTER — Ambulatory Visit: Payer: Medicaid Other

## 2019-11-21 ENCOUNTER — Ambulatory Visit: Payer: Medicaid Other | Attending: Family Medicine | Admitting: Family Medicine

## 2019-11-21 ENCOUNTER — Ambulatory Visit: Payer: Medicaid Other | Attending: Internal Medicine

## 2019-11-21 ENCOUNTER — Other Ambulatory Visit: Payer: Medicaid Other

## 2019-11-21 ENCOUNTER — Other Ambulatory Visit: Payer: Self-pay

## 2019-11-21 DIAGNOSIS — F3130 Bipolar disorder, current episode depressed, mild or moderate severity, unspecified: Secondary | ICD-10-CM

## 2019-11-21 DIAGNOSIS — C50919 Malignant neoplasm of unspecified site of unspecified female breast: Secondary | ICD-10-CM | POA: Diagnosis not present

## 2019-11-21 DIAGNOSIS — K219 Gastro-esophageal reflux disease without esophagitis: Secondary | ICD-10-CM

## 2019-11-21 DIAGNOSIS — W19XXXD Unspecified fall, subsequent encounter: Secondary | ICD-10-CM

## 2019-11-21 DIAGNOSIS — Z9181 History of falling: Secondary | ICD-10-CM

## 2019-11-21 DIAGNOSIS — K5909 Other constipation: Secondary | ICD-10-CM

## 2019-11-21 DIAGNOSIS — Z20822 Contact with and (suspected) exposure to covid-19: Secondary | ICD-10-CM

## 2019-11-21 MED ORDER — PANTOPRAZOLE SODIUM 40 MG PO TBEC: 40.0000 mg | DELAYED_RELEASE_TABLET | Freq: Every day | ORAL | 1 refills | Status: DC

## 2019-11-21 NOTE — Progress Notes (Signed)
Patient has been called and DOB has been verified. Patient has been screened and transferred to PCP to start phone visit.  Patient is not having any leg pain or numbness in the legs.  Patient is having pain in her shoulders.
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
## 2019-11-22 ENCOUNTER — Other Ambulatory Visit: Payer: Medicaid Other

## 2019-11-22 ENCOUNTER — Encounter: Payer: Self-pay | Admitting: Family Medicine

## 2019-11-22 ENCOUNTER — Ambulatory Visit: Payer: Medicaid Other

## 2019-11-23 LAB — NOVEL CORONAVIRUS, NAA: SARS-CoV-2, NAA: NOT DETECTED

## 2019-11-26 ENCOUNTER — Other Ambulatory Visit: Payer: Self-pay | Admitting: Oncology

## 2019-11-28 ENCOUNTER — Telehealth: Payer: Self-pay

## 2019-11-28 NOTE — Telephone Encounter (Signed)
Caller given negative result and verbalized understanding  

## 2019-12-06 ENCOUNTER — Ambulatory Visit (HOSPITAL_COMMUNITY)
Admission: RE | Admit: 2019-12-06 | Discharge: 2019-12-06 | Disposition: A | Discharge status: Home / Self Care | Payer: Medicaid Other | Source: Ambulatory Visit | Attending: Adult Health | Admitting: Adult Health

## 2019-12-06 ENCOUNTER — Other Ambulatory Visit: Payer: Self-pay

## 2019-12-06 DIAGNOSIS — I351 Nonrheumatic aortic (valve) insufficiency: Secondary | ICD-10-CM | POA: Insufficient documentation

## 2019-12-06 DIAGNOSIS — Z9882 Breast implant status: Secondary | ICD-10-CM | POA: Diagnosis not present

## 2019-12-06 DIAGNOSIS — Z01812 Encounter for preprocedural laboratory examination: Secondary | ICD-10-CM | POA: Insufficient documentation

## 2019-12-06 DIAGNOSIS — C7951 Secondary malignant neoplasm of bone: Secondary | ICD-10-CM | POA: Diagnosis not present

## 2019-12-06 DIAGNOSIS — C50919 Malignant neoplasm of unspecified site of unspecified female breast: Secondary | ICD-10-CM | POA: Diagnosis not present

## 2019-12-06 DIAGNOSIS — C770 Secondary and unspecified malignant neoplasm of lymph nodes of head, face and neck: Secondary | ICD-10-CM | POA: Insufficient documentation

## 2019-12-07 ENCOUNTER — Encounter: Payer: Self-pay | Admitting: Physical Therapy

## 2019-12-07 ENCOUNTER — Other Ambulatory Visit: Payer: Self-pay | Admitting: Radiation Therapy

## 2019-12-07 ENCOUNTER — Ambulatory Visit: Payer: Medicaid Other | Attending: Adult Health | Admitting: Physical Therapy

## 2019-12-07 ENCOUNTER — Other Ambulatory Visit: Payer: Self-pay

## 2019-12-07 DIAGNOSIS — R2681 Unsteadiness on feet: Secondary | ICD-10-CM | POA: Insufficient documentation

## 2019-12-07 DIAGNOSIS — M6281 Muscle weakness (generalized): Secondary | ICD-10-CM | POA: Diagnosis not present

## 2019-12-07 DIAGNOSIS — R293 Abnormal posture: Secondary | ICD-10-CM | POA: Diagnosis present

## 2019-12-07 DIAGNOSIS — R2689 Other abnormalities of gait and mobility: Secondary | ICD-10-CM | POA: Diagnosis present

## 2019-12-07 NOTE — Therapy (Signed)
[facility] [address] [place], [place], [address] Phone: [phone]   Fax:  [phone]  Physical Therapy Treatment  Patient Details  Name: [name] MRN: [id number] Date of Birth: 1961/03/01 Referring Provider (PT): Wilber Bihari, NP   Encounter Date: 12/07/2019  PT End of Session - 12/07/19 1149    Visit Number  13    Number of Visits  23   per recert [id number]   Date for PT Re-Evaluation  02/12/20    Authorization Type  Medicaid (3 visits 12/07/2019-12/27/2019)    Authorization Time Period  08/31/2019-09/20/2019; 09/28/19-10/25/19 (8 visits); 2 visits approved 12/11-12/22/2020; 3 visits 12/07/19/12/27/19    Authorization - Visit Number  1    Authorization - Number of Visits  3    PT Start Time  0806    PT Stop Time  0846    PT Time Calculation (min)  40 min    Equipment Utilized During Treatment  Gait belt    Activity Tolerance  Patient tolerated treatment well;No increased pain;Patient limited by fatigue   Pt requires brief, multiple seated rest breaks between exercises   Behavior During Therapy  [facility] for tasks assessed/performed   decreased safety awareness; needs repeated cues for braking technique, hand placement with transfers      Past Medical History:  Diagnosis Date  . Alcohol abuse   . Anemia    during chemo  . Anxiety    At age 47  . Arthritis Dx 2010  . Bipolar disorder ([place])   . Bronchitis   . Cancer ([place])    breast mets to brain  . Chronic pain   . Complication of anesthesia   . Depression   . Family history of adverse reaction to anesthesia    MOther had PONV  . Fibromyalgia Dx 2005  . GERD (gastroesophageal reflux disease)   . Headache    hx  migraines  . Lymphedema of left arm   . Opiate dependence ([place])   . PONV (postoperative nausea and vomiting)   . Port-A-Cath in place   . PTSD (post-traumatic stress disorder)     Past Surgical History:  Procedure Laterality Date  .  APPLICATION OF CRANIAL NAVIGATION N/A 08/14/2016   Procedure: APPLICATION OF CRANIAL NAVIGATION;  Surgeon: Erline Levine, MD;  Location: [facility];  Service: Neurosurgery;  Laterality: N/A;  . BREAST RECONSTRUCTION Left    with silicone implant  . COLONOSCOPY W/ POLYPECTOMY    . CRANIOTOMY N/A 08/14/2016   Procedure: CRANIOTOMY TUMOR EXCISION WITH Lucky Rathke;  Surgeon: Erline Levine, MD;  Location: [facility];  Service: Neurosurgery;  Laterality: N/A;  . FIBULA FRACTURE SURGERY Left   . MASTECTOMY Left   . RADIOLOGY WITH ANESTHESIA N/A 07/23/2016   Procedure: MRI OF BRAIN WITH AND WITHOUT;  Surgeon: Medication Radiologist, MD;  Location: [facility];  Service: Radiology;  Laterality: N/A;  . RADIOLOGY WITH ANESTHESIA N/A 09/08/2016   Procedure: MRI OF BRAIN WITH AND WITHOUT CONTRAST;  Surgeon: Medication Radiologist, MD;  Location: [facility];  Service: Radiology;  Laterality: N/A;  . RADIOLOGY WITH ANESTHESIA N/A 12/10/2016   Procedure: MRI OF BRAIN WITH AND WITHOUT;  Surgeon: Medication Radiologist, MD;  Location: [facility];  Service: Radiology;  Laterality: N/A;  . RADIOLOGY WITH ANESTHESIA N/A 03/02/2017   Procedure: MRI of BRAIN W and W/OUT CONTRAST;  Surgeon: Medication Radiologist, MD;  Location: [facility];  Service: Radiology;  Laterality: N/A;  . RADIOLOGY WITH ANESTHESIA N/A 07/29/2017   Procedure:  RADIOLOGY WITH ANESTHESIA MRI OF BRAIN WITH AND WITHOUT CONTRAST;  Surgeon: Radiologist, Medication, MD;  Location: [facility];  Service: Radiology;  Laterality: N/A;  . RADIOLOGY WITH ANESTHESIA N/A 12/07/2017   Procedure: MRI WITH ANESTHESIA OF BRAIN WITH AND WITHOUT CONTRAST;  Surgeon: Radiologist, Medication, MD;  Location: [facility];  Service: Radiology;  Laterality: N/A;  . RADIOLOGY WITH ANESTHESIA N/A 04/07/2018   Procedure: MRI OF BRAIN WITH AND WITHOUT CONTRAST;  Surgeon: Radiologist, Medication, MD;  Location: [facility];  Service: Radiology;  Laterality: N/A;  . RADIOLOGY WITH ANESTHESIA N/A 08/23/2018   Procedure: MRI  WITH ANESTHESIA OF THE BRAIN WITH AND WITHOUT;  Surgeon: Radiologist, Medication, MD;  Location: [facility];  Service: Radiology;  Laterality: N/A;  . RADIOLOGY WITH ANESTHESIA N/A 01/24/2019   Procedure: MRI OF BRAIN WITH AND WITHOUT CONTRAST;  Surgeon: Radiologist, Medication, MD;  Location: [facility];  Service: Radiology;  Laterality: N/A;  . RADIOLOGY WITH ANESTHESIA N/A 07/06/2019   Procedure: MRI WITH ANESTHESIA OF BRAIN WITH AND WITHOUT CONTRAST;  Surgeon: Radiologist, Medication, MD;  Location: [facility];  Service: Radiology;  Laterality: N/A;  . right power port placement Right     There were no vitals filed for this visit.  Subjective Assessment - 12/07/19 0808    Subjective  Been doing pretty well, just a little wobbliness yesterday.  No pain, no falls.    Pertinent History  Per [employer] lymphedema eval 07/2019:  Treated here in March of last year currently with a reid sleeve, day sleeve and compression pump. Lt mastectomy and ALND with reconstruction 07/08/15 for T4N2 stage IIIB IDC. Metastatic disease in the brain s/p craniotomy 08/14/16. Chemo completed in 2017 Trastuzumab and Pertuzumab, docetaxel. Radiation completed 2017. Stable bone lesions to the Rt scapula, Lt iliac crest, L4, Tspine. Also probable in the liver and lungs. Pt is also bipolar and a recovered drug and alchohol user.  Current treatment TDM-1, denosumab and anastrozole.    Limitations  Other (comment)    Patient Stated Goals  To get more stability back in legs    Currently in Pain?  No/denies                       [facility] Adult PT Treatment/Exercise - 12/07/19 0001      Transfers   Transfers  Sit to Stand;Stand to Sit      Ambulation/Gait   Ambulation/Gait  Yes    Ambulation/Gait Assistance  5: Supervision;4: Min guard    Ambulation/Gait Assistance Details  PT cues pt x 2 to stop to reset pace, as pt speeds up, needs cues for slowed pace, keeping rollator close and for heel strike.    Ambulation Distance  (Feet)  345 Feet   230 ft   Assistive device  Rollator    Gait Pattern  Step-through pattern;Decreased stride length;Shuffle;Narrow base of support;Poor foot clearance - left;Poor foot clearance - right   Hastening of gait with rollator   Ambulation Surface  Level;Indoor    Gait Comments  Cues with gait for slowed pace.  Pt turns to sit x 2 reps at UNLOCKED rollator.   PT gives cues to lock rollator before sitting, but she continues to sit without locking rollator.      Exercises   Exercises  Knee/Hip;Ankle      Knee/Hip Exercises: Standing   Knee Flexion  Strengthening;Right;Left;1 set;10 reps   2#   Hip Flexion  Stengthening;Right;Left;2 sets;10 reps;Knee bent   1st set 2#,  2nd set no resistance   Hip Abduction  Stengthening;Right;Left;1 set;10 reps;Knee straight   2# weight; attempted red theraband-pt w/difficulty   Abduction Limitations  2nd set no resistance, 10 reps, tactile/manual cues for technique to avoid external rotation    Hip Extension  Stengthening;Right;Left;1 set;10 reps;Knee straight   2#    Extension Limitations  2nd set 10 reps, no resistance    Other Standing Knee Exercises  Heel/toe raises, 2 sets x 10, cues for technique.  Step taps alternating to 4" step:  10 reps x 2 sets     Other Standing Knee Exercises  Sit<>stand x 10 reps, cues for hand placement      Ankle Exercises: Seated   Heel Raises  Both;10 reps   2 sets      Additional sit<>stand x at least 6 reps throughout session.  While seated, pt performs seated heel/toe raises with cues for technique.   Balance Exercises - 12/07/19 0842      Balance Exercises: Standing   Tandem Stance  Eyes open;Upper extremity support 2;2 reps;10 secs   Cues for posture, looking ahead to visual target   Other Standing Exercises  Feet shoulder width apart with alt UE reaches to cabinet x 10 reps for lessening UE support.        PT Education - 12/07/19 1145    Education Details  Increase to 2 sets of 10 reps for  all exercises in HEP, increase frequency of HEP to 5x/wk    Person(s) Educated  Patient    Methods  Explanation;Demonstration;Handout    Comprehension  Verbalized understanding;Returned demonstration;Tactile cues required;Verbal cues required       PT Short Term Goals - 11/14/19 1631      PT SHORT TERM GOAL #1   Title  Pt will be independent with progression of HEP to address balance, strength, and gait.  TARGET 3 weeks (12/31/2019)    Baseline  Updated HEP at 11/10/2019 visit; pt requires cues for updated HEP    Time  3    Period  Weeks    Status  Revised    Target Date  12/31/19      PT SHORT TERM GOAL #2   Title  Pt will improve 5x sit<>stand test to less than or equal to 11.5 seconds to demonstrate improved lower extremity functional strength.    Baseline  5x sit<>stand:  13.75 sec 11/14/2019    Time  3    Period  Weeks    Status  New    Target Date  12/31/19      PT SHORT TERM GOAL #3   Title  Pt will stand for at least 10 minutes during PT sessions, without sitting breaks, for improved participation in ADLs and household tasks.    Baseline  11/14/2019:  Pt requires seated rest break after 2-3 minutes of standing during PT sessions.    Time  3    Period  Weeks    Status  New    Target Date  12/31/19        PT Long Term Goals - 11/14/19 1643      PT LONG TERM GOAL #1   Title  Pt will be independent with progression of HEP and verbalize plans for continued community fitness upon d/c from PT.  TARGET 01/28/2020    Baseline  Updates to HEP provided at 11/10/2019 visit    Time  7    Period  Weeks    Status  Revised  Target Date  01/28/20      PT LONG TERM GOAL #2   Title  Pt will improve Berg Balance score to at least 45/56 for decreased fall risk.    Baseline  Score 39/56 11/14/2019; scores <45/56 indicate increased fall risk    Time  7    Status  Revised    Target Date  01/28/20      PT LONG TERM GOAL #3   Title  Pt will improve gait velocity to at least 2.62  ft/sec for improved gait efficiency and safety.    Baseline  2.39 ft/sec 11/14/2019 with rollator    Time  7    Period  Weeks    Status  Revised    Target Date  01/28/20      PT LONG TERM GOAL #4   Title  Pt will negotiate 1 curb step to simulate step up into home, no rail, with supervision, for improved safety with curbs/home entry.    Baseline  11/14/2019:  requires min assist and cues for safe curb negotiation    Time  7    Period  Weeks    Status  Revised      PT LONG TERM GOAL #5   Title  Pt will negotiate at least 4 steps with one handrail with supervision, for improved safety with stair negotiation.    Baseline  min assist for steps 1 handrail 11/14/2019    Time  7    Period  Weeks    Status  New    Target Date  01/28/20            Plan - 12/07/19 1156    Clinical Impression Statement  Worked on progressing pt's HEP today, but increasing number of sets for all exercises to 2 sets (of 10 reps).  Tried 2# weights and theraband for resistance, but pt has difficulty maintaining good form of exercise, so weights/resistance removed.  Pt continues to demo decreased safety awareness with sitting at unlocked rollator, continued cues for correct hand placement with transfers, and hastening pace of gait, needing cues to slow pace and stay within BOS of rollator.  Pt requires multiple seated rest breaks due to fatigue throughout session.  Pt will continue to benefit from skilled PT to work towards goals for improved overall functional mobility.    Personal Factors and Comorbidities  Comorbidity 3+    Comorbidities  See PMH (subjective); metastatic breast cancer, hx of bipolar and anxiety    Examination-Activity Limitations  Stairs   Curbs   Examination-Participation Restrictions  --   Work   Stability/Clinical Decision Making  Stable/Uncomplicated    Rehab Potential  Good    PT Frequency  Other (comment)   1x/wk for 3 weeks, then 2x/wk for 4 weeks   PT Duration  --   7 weeks  total POC   PT Treatment/Interventions  ADLs/Self Care Home Management;Neuromuscular re-education;Gait training;Stair training;Patient/family education;Therapeutic activities;Functional mobility training;Therapeutic exercise;Balance training;DME Instruction    PT Next Visit Plan  Ask if pt has increased HEP to 2 sets of 10; increase standing time/tolerance; use of machines for strengthening/parallel bars for balance    Consulted and Agree with Plan of Care  Patient       Patient will benefit from skilled therapeutic intervention in order to improve the following deficits and impairments:  Abnormal gait, Difficulty walking, Decreased mobility, Decreased strength, Decreased balance  Visit Diagnosis: Muscle weakness (generalized)  Other abnormalities of gait and mobility  Problem List Patient Active Problem List   Diagnosis Date Noted  . Brain metastases ([place]) 11/01/2019  . Thrombocytopenia ([place]) 08/09/2019  . Port-A-Cath in place 06/20/2019  . 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 S/P mastectomy, left 12/09/2018  . S/P breast reconstruction, left 12/09/2018  . S/P radiation therapy 12/09/2018  . GERD (gastroesophageal reflux disease) 12/10/2017  . Edema 11/09/2017  . Sensorineural hearing loss (SNHL) of both ears 11/05/2017  . Vitamin D deficiency 01/18/2017  . Bipolar I disorder, most recent episode depressed ([place]) 12/08/2016  . Adjustment disorder with anxiety 12/06/2016  . History of cancer metastatic to brain 07/27/2016  . Iron deficiency anemia 06/26/2016  . Bone metastases ([place]) 06/03/2016  . Primary cancer of lower-inner quadrant of left female breast ([place]) 06/01/2016  . Malignant neoplasm of female breast ([place]) 07/01/2015  . Current smoker 03/28/2015  . Seasonal allergies 03/28/2015  . Anxiety state 02/28/2015  . Fibromyalgia 02/28/2015  . Family history of diabetes mellitus 02/28/2015  . H/O alcohol abuse     [name]  W. 12/07/2019, 12:06 PM  Frazier Butt., PT  [facility] [address] [place], [place], [address] Phone: [phone]   Fax:  [phone]  Name: [name] MRN: [id number] Date of Birth: November 15, 1961

## 2019-12-07 NOTE — Patient Instructions (Signed)
Wrote on pt's copy of HEP to increase all exercises to 2 sets of 10 reps, 5x/week (she is currently performing 1 set of 10, 3x/wk)

## 2019-12-08 ENCOUNTER — Other Ambulatory Visit: Payer: Self-pay | Admitting: Family Medicine

## 2019-12-08 DIAGNOSIS — J302 Other seasonal allergic rhinitis: Secondary | ICD-10-CM

## 2019-12-11 NOTE — Progress Notes (Signed)
[facility]  [phone]) [phone] Fax:(336) [phone]     ID: Derotha Fishbaugh DOB: 1961/05/21  MR#: [id number]  MVH#:[id number]  Patient Care Team: Charlott Rakes, MD as PCP - General (Family Medicine) Kyung Rudd, MD as Consulting Physician (Radiation Oncology) Erline Levine, MD as Consulting Physician (Neurosurgery) Corena Pilgrim, MD as Consulting Physician (Psychiatry) Mickeal Skinner Acey Lav, MD as Consulting Physician (Psychiatry) Nehemiah Settle, MD as Referring Physician (Plastic Surgery) Irene Limbo, MD as Consulting Physician (Plastic Surgery) Dillingham, Loel Lofty, DO as Attending Physician (Plastic Surgery)   CHIEF COMPLAINT: Metastatic triple positive breast cancer  CURRENT TREATMENT: trastuzumab, denosumab/Xgeva (Q6W), anastrozole   INTERVAL HISTORY: [name] returns today for follow up and treatment of her metastatic triple positive breast cancer.  We went back to [place] 3 weeks ago after her taking TDM 1 for several months.  She still has somewhat low platelets.  I am hoping these will drift upwards.  She is tolerating denosumab/Xgeva every 6 weeks with no side effects that she is aware of.  She also does generally well with anastrozole.  She has some vaginal dryness, and she is using some coconut oil regarding this.  Since her last visit, she underwent repeat echocardiogram on 12/06/2019, which showed an ejection fraction of 60-65%.  She is scheduled for repeat brain MRI on 12/26/2019 and to see Dr. Mickeal Skinner shortly after   REVIEW OF SYSTEMS: Tranice tells me she went to [facility] for the holidays and spent time with her family and enjoyed that a great deal.  She was having balance issues.  She has not fallen though in the last 2 weeks and she is getting physical therapy for this.  She bruises easily.  She denies unusual headaches visual changes nausea or vomiting.  She has not had a cough or phlegm production and no pleurisy.  There has been no change in  bowel or bladder habits.  A detailed review of systems today was otherwise stable   BREAST CANCER HISTORY: From the original intake note:  [name] was aware of a "lemon sized lump in" her left axilla for about a year before bringing it to medical attention. By then she had developed left breast and left axillary swelling (June 2016). She presented to the local emergency room and had a chest CT scan 06/06/2015 which showed a nodule in the left breast measuring 0.9 cm and questionable left axillary adenopathy. She then proceeded to bilateral diagnostic mammography and left breast ultrasonography 06/19/2015. There were no prior films for comparison (last mammography 12 years prior).. The breast density was category C. Mammography showed in the left breast upper inner quadrant a 7 cm area including a small mass and significant pleomorphic calcifications. Ultrasonography defined the mass as measuring 1.2 cm. The left axilla appeared unremarkable. There was significant skin edema.  Biopsy of the left breast mass 06/19/2015 showed (SP [id number]) an invasive ductal carcinoma, grade 2, estrogen receptor 83% positive, progesterone receptor 26% positive, and HER-2 amplified by immunohistochemstry with a 3+ reading. The patient had biopsies of a separate area in the left breast August of the same year and this showed atypical ductal hyperplasia. (SP [id number]).  Accordingly after appropriate discussion on 08/21/2015 the patient proceeded to left mastectomy with left axillary sentinel lymph node sampling, which, since the lymph nodes were positive, extended to the procedure to left axillary lymph node dissection. The pathology (SP [id number]) showed an invasive ductal carcinoma, grade 3, measuring in excess of 9 cm. There were also skin satellites, not contiguous  with the invasive carcinoma. Margins were clear and ample. There was evidence of lymphovascular invasion. A total of 15 lymph nodes were removed, including 5  sentinel lymph nodes, all of which were positive, so that the final total was 14 out of 15 lymph nodes involved by tumor. There was evidence of extranodal extension. The final pathology was pT4b pN2a, stage IIIB  CA-27-29 and CEA 09/19/2015 were non-informative October 2016.  Unfortunately CT scans of the chest abdomen and pelvis 09/16/2015 showed bony metastases to the ri/ght scapula, left iliac crest, and also L4 and T-spine. There were questionable liver cysts which on repeat CT scan 03/02/2016 appear to be a little bit more well-defined, possibly a little larger. There were also some possible right upper lobe lung lesions.  Adjuvant treatment consisted of docetaxel, trastuzumab and pertuzumab, with the final (6th) docetaxel dose given 02/11/2016. She continues on trastuzumab and pertuzumab, with the 11th cycle given 05/05/2016. Echocardiogram 02/26/2016 showed an ejection fraction of 55%. She receives denosumab/Xgeva every 4 weeks.. She also receives radiation, started 06/09/201, to be completed 06/26/2016.  Her subsequent history is as detailed below   PAST MEDICAL HISTORY: Past Medical History:  Diagnosis Date  . Alcohol abuse   . Anemia    during chemo  . Anxiety    At age 59  . Arthritis Dx 2010  . Bipolar disorder ([place])   . Bronchitis   . Cancer ([place])    breast mets to brain  . Chronic pain   . Complication of anesthesia   . Depression   . Family history of adverse reaction to anesthesia    MOther had PONV  . Fibromyalgia Dx 2005  . GERD (gastroesophageal reflux disease)   . Headache    hx  migraines  . Lymphedema of left arm   . Opiate dependence ([place])   . PONV (postoperative nausea and vomiting)   . Port-A-Cath in place   . PTSD (post-traumatic stress disorder)     PAST SURGICAL HISTORY: Past Surgical History:  Procedure Laterality Date  . APPLICATION OF CRANIAL NAVIGATION N/A 08/14/2016   Procedure: APPLICATION OF CRANIAL NAVIGATION;  Surgeon: Erline Levine, MD;   Location: [facility];  Service: Neurosurgery;  Laterality: N/A;  . BREAST RECONSTRUCTION Left    with silicone implant  . COLONOSCOPY W/ POLYPECTOMY    . CRANIOTOMY N/A 08/14/2016   Procedure: CRANIOTOMY TUMOR EXCISION WITH Lucky Rathke;  Surgeon: Erline Levine, MD;  Location: [facility];  Service: Neurosurgery;  Laterality: N/A;  . FIBULA FRACTURE SURGERY Left   . MASTECTOMY Left   . RADIOLOGY WITH ANESTHESIA N/A 07/23/2016   Procedure: MRI OF BRAIN WITH AND WITHOUT;  Surgeon: Medication Radiologist, MD;  Location: [facility];  Service: Radiology;  Laterality: N/A;  . RADIOLOGY WITH ANESTHESIA N/A 09/08/2016   Procedure: MRI OF BRAIN WITH AND WITHOUT CONTRAST;  Surgeon: Medication Radiologist, MD;  Location: [facility];  Service: Radiology;  Laterality: N/A;  . RADIOLOGY WITH ANESTHESIA N/A 12/10/2016   Procedure: MRI OF BRAIN WITH AND WITHOUT;  Surgeon: Medication Radiologist, MD;  Location: [facility];  Service: Radiology;  Laterality: N/A;  . RADIOLOGY WITH ANESTHESIA N/A 03/02/2017   Procedure: MRI of BRAIN W and W/OUT CONTRAST;  Surgeon: Medication Radiologist, MD;  Location: [facility];  Service: Radiology;  Laterality: N/A;  . RADIOLOGY WITH ANESTHESIA N/A 07/29/2017   Procedure: RADIOLOGY WITH ANESTHESIA MRI OF BRAIN WITH AND WITHOUT CONTRAST;  Surgeon: Radiologist, Medication, MD;  Location: [facility];  Service: Radiology;  Laterality:  N/A;  . RADIOLOGY WITH ANESTHESIA N/A 12/07/2017   Procedure: MRI WITH ANESTHESIA OF BRAIN WITH AND WITHOUT CONTRAST;  Surgeon: Radiologist, Medication, MD;  Location: [facility];  Service: Radiology;  Laterality: N/A;  . RADIOLOGY WITH ANESTHESIA N/A 04/07/2018   Procedure: MRI OF BRAIN WITH AND WITHOUT CONTRAST;  Surgeon: Radiologist, Medication, MD;  Location: [facility];  Service: Radiology;  Laterality: N/A;  . RADIOLOGY WITH ANESTHESIA N/A 08/23/2018   Procedure: MRI WITH ANESTHESIA OF THE BRAIN WITH AND WITHOUT;  Surgeon: Radiologist, Medication, MD;  Location: [facility];  Service: Radiology;   Laterality: N/A;  . RADIOLOGY WITH ANESTHESIA N/A 01/24/2019   Procedure: MRI OF BRAIN WITH AND WITHOUT CONTRAST;  Surgeon: Radiologist, Medication, MD;  Location: [facility];  Service: Radiology;  Laterality: N/A;  . RADIOLOGY WITH ANESTHESIA N/A 07/06/2019   Procedure: MRI WITH ANESTHESIA OF BRAIN WITH AND WITHOUT CONTRAST;  Surgeon: Radiologist, Medication, MD;  Location: [facility];  Service: Radiology;  Laterality: N/A;  . right power port placement Right     FAMILY HISTORY Family History  Problem Relation Age of Onset  . Diabetes Mother   . Bipolar disorder Mother   . CAD Father    The patient's father still living, age 53, in [place]. He had prostate cancer at some point in the past. The patient's mother died at age 79 from complications of diabetes. The patient had no brothers, 2 sisters. A paternal grandmother had lung cancer in the setting of tobacco abuse. There is no other history of cancer in the family to her knowledge   GYNECOLOGIC HISTORY:  No LMP recorded. Patient has had an ablation. Menarche approximately age 50. First live birth in 26. The patient is GX P2. She underwent endometrial ablation in 2016.   SOCIAL HISTORY: Updated August 2019 The patient is originally from [place]. She has lived in [place] before but more recently was in [place]. She is back here because she could not afford her rent in [place]. She is divorced. Her 2 children are [name] who lives in [place] and works as a Development worker, community, and [name] who also lives in [place] and works as a Catering manager. The patient has a grandchild, Arelia Longest, 60 years old as of July 2019, living in [place] with his mother. The patient also has a grandson born in February 2019, who lives in [place]. The patient has not established herself with a local church yet.    ADVANCED DIRECTIVES: Not in place; at the 06/03/2016 visit the patient was given the appropriate forms to complete and notarize at her  discretion   HEALTH MAINTENANCE: Social History   Tobacco Use  . Smoking status: Current Every Day Smoker    Packs/day: 0.25    Years: 43.00    Pack years: 10.75    Types: Cigarettes  . Smokeless tobacco: Never Used  Substance Use Topics  . Alcohol use: No    Comment: no ETOH since 08/22/12  . Drug use: No    Comment: states she's in recovery program for 7 years     Colonoscopy:  PAP:  Bone density:   Allergies  Allergen Reactions  . Demerol [Meperidine Hcl] Itching and Nausea And Vomiting  . Erythromycin Rash    Current Outpatient Medications on File Prior to Visit  Medication Sig Dispense Refill  . anastrozole (ARIMIDEX) 1 MG tablet TAKE 1 TABLET BY MOUTH EVERY DAY 90 tablet 1  . Biotin 5 MG TABS Take 5 mg by mouth daily.     [name]  Cholecalciferol (VITAMIN D3) 50 MCG (2000 UT) capsule Take 2,000 Units by mouth daily.     . cyclobenzaprine (FLEXERIL) 10 MG tablet TAKE 1 TABLET BY MOUTH TWICE A DAY (Patient taking differently: Take 10 mg by mouth 2 (two) times daily. ) 60 tablet 1  . cycloSPORINE (RESTASIS) 0.05 % ophthalmic emulsion Place 1 drop into both eyes 2 (two) times daily.    [name] docusate sodium (COLACE) 100 MG capsule Take 100 mg by mouth daily.     . fluconazole (DIFLUCAN) 100 MG tablet Take 1 tablet (100 mg total) by mouth daily. (Patient not taking: Reported on 11/21/2019) 10 tablet 0  . fluticasone (FLONASE) 50 MCG/ACT nasal spray SPRAY 2 SPRAYS INTO EACH NOSTRIL EVERY DAY 16 mL 2  . gabapentin (NEURONTIN) 300 MG capsule Take 1-2 capsules (300-600 mg total) by mouth See admin instructions. Take 300 mg by mouth in the morning and take 600 mg by mouth at bedtime 90 capsule 0  . lamoTRIgine (LAMICTAL) 100 MG tablet Take 100 mg by mouth every morning.    . loratadine (CLARITIN) 10 MG tablet TAKE 1 TABLET BY MOUTH EVERY DAY 30 tablet 6  . Lurasidone HCl (LATUDA) 60 MG TABS Take 60 mg by mouth daily.    . ondansetron (ZOFRAN) 8 MG tablet TAKE 1 TABLET (8 MG TOTAL) BY  MOUTH EVERY 8 (EIGHT) HOURS AS NEEDED FOR NAUSEA OR VOMITING. 90 tablet 1  . pantoprazole (PROTONIX) 40 MG tablet Take 1 tablet (40 mg total) by mouth daily. 90 tablet 1  . polyethylene glycol powder (GLYCOLAX/MIRALAX) 17 GM/SCOOP powder Take by mouth.    . traZODone (DESYREL) 100 MG tablet Take 1 tablet (100 mg total) by mouth at bedtime. (Patient taking differently: Take 300 mg by mouth at bedtime. ) 30 tablet 0   No current facility-administered medications on file prior to visit.     OBJECTIVE: Middle-aged white woman who appears stated age  58:   12/12/19 0929  BP: 99/73  Pulse: 90  Resp: 18  Temp: 98.2 F (36.8 C)  TempSrc: Temporal  SpO2: 99%  Weight: 141 lb 11.2 oz (64.3 kg)  Height: 5' 5"  (1.651 m)  Body mass index is 23.58 kg/m.   ECOG FS: 1 - Symptomatic but completely ambulatory   Sclerae unicteric, EOMs intact Wearing a mask No cervical or supraclavicular adenopathy Lungs no rales or rhonchi Heart regular rate and rhythm Abd soft, nontender, positive bowel sounds MSK no focal spinal tenderness, no upper extremity lymphedema Neuro: nonfocal, well oriented, slightly flat affect Breasts: The right breast is unremarkable.  The left wrist is status post mastectomy and reconstruction.  The skin is a little bit tight but there is no evidence of local recurrence.  Both axillae are benign.   LAB RESULTS: No results found for: LABCA2  CBC    Component Value Date/Time   WBC 2.1 (L) 12/12/2019 0916   RBC 4.04 12/12/2019 0916   HGB 11.6 (L) 12/12/2019 0916   HGB 13.5 07/05/2019 1129   HGB 12.1 10/26/2017 1038   HCT 36.4 12/12/2019 0916   HCT 35.7 10/26/2017 1038   PLT 48 (L) 12/12/2019 0916   PLT 54 (L) 07/05/2019 1129   PLT 167 10/26/2017 1038   MCV 90.1 12/12/2019 0916   MCV 98.4 10/26/2017 1038   MCH 28.7 12/12/2019 0916   MCHC 31.9 12/12/2019 0916   RDW 15.8 (H) 12/12/2019 0916   RDW 13.3 10/26/2017 1038   LYMPHSABS 0.5 (L) 12/12/2019 5102  LYMPHSABS 0.5 (L) 10/26/2017 1038   MONOABS 0.2 12/12/2019 0916   MONOABS 0.1 10/26/2017 1038   EOSABS 0.0 12/12/2019 0916   EOSABS 0.0 10/26/2017 1038   BASOSABS 0.0 12/12/2019 0916   BASOSABS 0.0 10/26/2017 1038   CMP Latest Ref Rng & Units 12/12/2019 11/01/2019 10/11/2019  Glucose 70 - 99 mg/dL 96 92 186(H)  BUN 6 - 20 mg/dL 10 10 10   Creatinine 0.44 - 1.00 mg/dL 0.88 0.82 0.92  Sodium 135 - 145 mmol/L 143 143 140  Potassium 3.5 - 5.1 mmol/L 3.7 3.5 3.3(L)  Chloride 98 - 111 mmol/L 106 104 103  CO2 22 - 32 mmol/L 27 27 29   Calcium 8.9 - 10.3 mg/dL 8.6(L) 8.8(L) 9.6  Total Protein 6.5 - 8.1 g/dL 6.7 6.6 6.8  Total Bilirubin 0.3 - 1.2 mg/dL 0.5 0.5 0.4  Alkaline Phos 38 - 126 U/L 80 85 94  AST 15 - 41 U/L 35 41 34  ALT 0 - 44 U/L 21 23 17      STUDIES: ECHOCARDIOGRAM COMPLETE  Result Date: 12/06/2019   ECHOCARDIOGRAM REPORT   Patient Name:   [name] Date of Exam: 12/06/2019 Medical Rec #:  [id number]    Height:       65.0 in Accession #:    [id number]   Weight:       147.7 lb Date of Birth:  Apr 07, 1961    BSA:          1.74 m Patient Age:    74 years     BP:           123/75 mmHg Patient Gender: F            HR:           91 bpm. Exam Location:  Outpatient Procedure: 2D Echo and Strain Analysis Indications:    Chemo Z09  History:        Patient has prior history of Echocardiogram examinations, most                 recent 05/01/2019.  Sonographer:    Mikki Santee RDCS (AE) Referring Phys: [id number] [place]  Sonographer Comments: Technically difficult study due to poor echo windows. Image acquisition challenging due to breast implants. IMPRESSIONS  1. Left ventricular ejection fraction, by visual estimation, is 60 to 65%. The left ventricle has normal function. Left ventricular septal wall thickness was normal. There is no left ventricular hypertrophy.  2. Left ventricular diastolic parameters are consistent with Grade I diastolic dysfunction (impaired relaxation).  3. The left  ventricle has no regional wall motion abnormalities.  4. Global right ventricle has normal systolic function.The right ventricular size is normal. No increase in right ventricular wall thickness.  5. Left atrial size was normal.  6. Right atrial size was normal.  7. The mitral valve is normal in structure. No evidence of mitral valve regurgitation.  8. The tricuspid valve is normal in structure.  9. The aortic valve is tricuspid. Aortic valve regurgitation is trivial. 10. The pulmonic valve was grossly normal. Pulmonic valve regurgitation is not visualized. 11. The average left ventricular global longitudinal strain is -18.8 %. 12. The interatrial septum was not well visualized. FINDINGS  Left Ventricle: Left ventricular ejection fraction, by visual estimation, is 60 to 65%. The left ventricle has normal function. The average left ventricular global longitudinal strain is -18.8 %. The left ventricle has no regional wall motion abnormalities. There is no left ventricular hypertrophy. Left ventricular diastolic  parameters are consistent with Grade I diastolic dysfunction (impaired relaxation). Right Ventricle: The right ventricular size is normal. No increase in right ventricular wall thickness. Global RV systolic function is has normal systolic function. Left Atrium: Left atrial size was normal in size. Right Atrium: Right atrial size was normal in size Pericardium: There is no evidence of pericardial effusion. Mitral Valve: The mitral valve is normal in structure. No evidence of mitral valve regurgitation. Tricuspid Valve: The tricuspid valve is normal in structure. Tricuspid valve regurgitation is trivial. Aortic Valve: The aortic valve is tricuspid. Aortic valve regurgitation is trivial. There is mild calcification of the aortic valve. Pulmonic Valve: The pulmonic valve was grossly normal. Pulmonic valve regurgitation is not visualized. Pulmonic regurgitation is not visualized. Aorta: The aortic root and ascending  aorta are structurally normal, with no evidence of dilitation. IAS/Shunts: The interatrial septum was not well visualized.  LEFT VENTRICLE PLAX 2D LVIDd:         4.60 cm  Diastology LVIDs:         2.20 cm  LV e' lateral:   7.83 cm/s LV PW:         1.00 cm  LV E/e' lateral: 6.9 LV IVS:        0.90 cm  LV e' medial:    8.16 cm/s LVOT diam:     2.00 cm  LV E/e' medial:  6.6 LV SV:         81 ml LV SV Index:   46.02    2D Longitudinal Strain LVOT Area:     3.14 cm 2D Strain GLS Avg:     -18.8 %  RIGHT VENTRICLE RV S prime:     12.60 cm/s TAPSE (M-mode): 1.0 cm LEFT ATRIUM             Index       RIGHT ATRIUM          Index LA diam:        3.00 cm 1.72 cm/m  RA Area:     6.55 cm LA Vol (A2C):   34.5 ml 19.84 ml/m RA Volume:   11.20 ml 6.44 ml/m LA Vol (A4C):   18.9 ml 10.87 ml/m LA Biplane Vol: 26.2 ml 15.06 ml/m  AORTIC VALVE LVOT Vmax:   71.10 cm/s LVOT Vmean:  47.000 cm/s LVOT VTI:    0.133 m  AORTA Ao Root diam: 2.70 cm MITRAL VALVE MV Area (PHT): 3.89 cm             SHUNTS MV PHT:        56.55 msec           Systemic VTI:  0.13 m MV Decel Time: 195 msec             Systemic Diam: 2.00 cm MV E velocity: 54.00 cm/s 103 cm/s MV A velocity: 54.40 cm/s 70.3 cm/s MV E/A ratio:  0.99       1.5  Glori Bickers MD Electronically signed by Glori Bickers MD Signature Date/Time: 12/06/2019/3:11:34 PM    Final     ELIGIBLE FOR AVAILABLE RESEARCH PROTOCOL: no  ASSESSMENT: 59 y.o. [place] woman with stage IV left-sided breast cancer involving bone and central nervous system  (1) s/p left breast lower inner quadrant biopsy 06/19/2015 for a clinical T2-3 NX invasive ductal carcinoma, grade 2, triple positive.  (2) status post left mastectomy and axillary lymph node dissection  with immediate expander placement 07/18/2015 for an mpT4 pN2,stage IIIB invasive ductal carcinoma,  grade 3, with negative margins.  (a) definitive implant exchange to be scheduled in December   METASTATIC DISEASE: October 2016  (3)  CT scan of the chest abdomen and pelvis  09/16/2015 shows metastatic lesions in the right scapula, left iliac crest, L4, and T spine. There were questionable liver cysts, with repeat CT scan 03/02/2016 showing possible right upper lobe lung lesions and possibly increased liver lesions  (a) CT scan of the chest 06/17/2016 shows no active disease in the lungs or liver  (b) Bone scan July 2017 showed no evidence of bony metastatic disease   (c) head CT 07/08/2016 showed a cerebellar lesion, confirmed by MRI 07/23/2016, status post craniotomy 08/14/2016, confirming a metastatic deposit which was estrogen and progesterone receptor negative, HER-2 amplified with a signals ratio of 7.16, number per cell 13.25  (d) CA 27-29 is not informative  (4) received docetaxel every 3 weeks 6 together with trastuzumab and pertuzumab, last docetaxel dose 02/11/2016  (5) adjuvant radiation7/03/2016 to 06/26/2016 at [facility]: 1. The Left chest wall was treated to 23.4 Gy in 13 fractions at 1.8 Gy per fraction. 2. The Left chest wall was boosted to 10 Gy in 5 fractions at 2 Gy per fraction. 3. The Left Sclav/PAB was treated to 23.4 Gy in 13 fractions at 1.8 Gy per fraction.  [Note: Including the patient's treatment in [place] (received 15 fractions per Dr. Maryan Rued near [place], [place]), the patient received 50.4 Gy to the left chest wall and supraclavicular region. ]  (6) started trastuzumab and pertuzumab October 2016, continuing every 3 weeks,  (a) echocardiogram 02/26/2016 showed a well preserved ejection fraction  (b) echocardiogram 07/01/2016 shows an ejection fraction in the 60-65%   (c) pertuzumab discontinued 10/2016 with uncontrolled diarrhea  (d) echocardiogram 11/11/2016 showed an ejection fraction in the 60-65%  (e) echocardiogram 03/03/2017 shows an ejection fraction of 60-65%  (f) echocardiogram on 05/19/2017 shows an ejection fraction of 55-60%  (g) echocardiogram 09/24/2017 shows the ejection  fraction in the 60-65%  (h) echocardiogram 02/14/2018 shows an ejection fraction in the 60-65%  (I) echocardiogram  06/30/2018 shows an ejection fraction in the 55-60%  (m) echocardiogram on 12/08/2018 shows an ejection fraction in the 60-65% range      (7) started denosumab/Xgeva October 2017 given every 4 weeks, transitioned to every 8 weeks beginning 10/11/18 (every 6 weeks while giving TDM1 every 3 weeks)  (8) started anastrozole October 2017   (a) bone scan 11/10/2016 shows no active disease  (b) chest CT scan 11/10/2016 stable, with no evidence of active disease  (c) chest CT and bone scan 07/02/2017 show no evidence of active disease  (d) CT scan of the chest with contrast 11/10/2017 shows some left axillary edema, but no evidence of thrombosis or adenopathy in that area, 0.9 cm precarinal lymph and 0.7 cm right upper lobe nodule node; bone lesions stable  (e) CT of the chest 05/04/2018 shows a 1.4 cm right lower paratracheal node which is slightly increased and a new right prevascular mediastinal node measuring 0.7 cm.  Bone lesions are stable.  (f) chest CT on 07/01/2018 shows no definite findings of metastatic disease in the thorax. Previously noted borderline enlarged low right paratracheal lymph node is stable to slightly decreased in size   (e) chest CT on 12/30/2018 notes mild increase in right paratracheal adneopathy, recommended PET scan.  Pet scan on 01/19/2019 showed a hypermetabolic and enlarged right paratracheal lymph node consistent with breast cancer recurrence.  (f)Trastuzumab discontinued due to  February 2020 scans   (g) TDM-1 started on 01/31/2019 given every 21 days.  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  (a) brain irradiation10/19/17-11/08/17: Whole brain/ 35 Gy in 14 fractions   (b) repeat brain MRI obtained 12/10/2016 shows no active disease in the brain  (c) repeat brain MRI 03/02/2017 shows no evidence of residual or recurrent disease  (d) repeat brain MRI 07/29/2017 shows no evidence of residual or recurrent disease  (e) repeat brain MRI 12/07/2017 shows no evidence of disease recurrence.  There is progressive white matter change secondary to prior treatment.  (f) repeat brain MRI 04/07/2018 showed no evidence of disease\  (g) repeat brain MRI on 08/23/2018 shows no evidence of disease  (h) repeat brain MRI on 01/25/2019 shows no evidence of disease  (I) brain MRI 07/06/2019 shows no evidence of active disease  (j) brain MRI 12/21/2019   PLAN: Jasamine is clinically very stable.  She is tolerating the Herceptin well and has an excellent cardiac function.  She has no dental or other issues related to the denosumab/Xgeva which she is currently receiving every 8 weeks.  She tolerates anastrozole well.  She is already scheduled for repeat brain MRI later this month with a follow-up with Dr. Mickeal Skinner  I am going to obtain a CT of the chest and a bone scan in March.  Her last set of scans was in September 2020.  She is continuing on physical therapy for balance and that seems to be helping.  She is on multiple medications that could possibly affect her balance as well of course as the fact that she had whole brain radiation 3 years ago.  At this point I am encouraged that she is so stable.  She knows to call for any other issue that  may develop before the next visit.  Total encounter time 35 minutes*  Lanie Schelling C. Dayani Winbush, MD  12/12/19 10:20 AM Medical Oncology and Hematology [facility] [place], [place] [address] Tel. [phone]    Fax. [phone]   I, Wilburn Mylar, am acting as scribe for Dr. Virgie Dad. Kataleya Zaugg.  I, Lurline Del MD, have reviewed the above documentation for accuracy and completeness, and I agree with the above.    *Total Encounter Time as defined by the Centers for Medicare and Medicaid Services includes, in addition to the face-to-face time of a patient visit (documented in the note above) non-face-to-face time: obtaining and reviewing outside history, ordering and reviewing medications, tests or procedures, care coordination (communications with other health care professionals or caregivers) and documentation in the medical record.

## 2019-12-12 ENCOUNTER — Inpatient Hospital Stay: Payer: Medicaid Other

## 2019-12-12 ENCOUNTER — Ambulatory Visit: Payer: Medicaid Other | Admitting: Physical Therapy

## 2019-12-12 ENCOUNTER — Telehealth: Payer: Self-pay | Admitting: *Deleted

## 2019-12-12 ENCOUNTER — Other Ambulatory Visit: Payer: Self-pay

## 2019-12-12 ENCOUNTER — Inpatient Hospital Stay (HOSPITAL_BASED_OUTPATIENT_CLINIC_OR_DEPARTMENT_OTHER): Payer: Medicaid Other | Admitting: Oncology

## 2019-12-12 ENCOUNTER — Inpatient Hospital Stay: Payer: Medicaid Other | Attending: Medical

## 2019-12-12 VITALS — BP 99/73 | HR 90 | Temp 98.2°F | Resp 18 | Ht 65.0 in | Wt 141.7 lb

## 2019-12-12 DIAGNOSIS — C7931 Secondary malignant neoplasm of brain: Secondary | ICD-10-CM

## 2019-12-12 DIAGNOSIS — C50312 Malignant neoplasm of lower-inner quadrant of left female breast: Secondary | ICD-10-CM

## 2019-12-12 DIAGNOSIS — C7951 Secondary malignant neoplasm of bone: Secondary | ICD-10-CM | POA: Diagnosis not present

## 2019-12-12 DIAGNOSIS — F411 Generalized anxiety disorder: Secondary | ICD-10-CM

## 2019-12-12 DIAGNOSIS — Z5112 Encounter for antineoplastic immunotherapy: Secondary | ICD-10-CM | POA: Diagnosis present

## 2019-12-12 DIAGNOSIS — Z95828 Presence of other vascular implants and grafts: Secondary | ICD-10-CM

## 2019-12-12 DIAGNOSIS — C770 Secondary and unspecified malignant neoplasm of lymph nodes of head, face and neck: Secondary | ICD-10-CM

## 2019-12-12 DIAGNOSIS — Z7189 Other specified counseling: Secondary | ICD-10-CM

## 2019-12-12 DIAGNOSIS — C50119 Malignant neoplasm of central portion of unspecified female breast: Secondary | ICD-10-CM

## 2019-12-12 DIAGNOSIS — D696 Thrombocytopenia, unspecified: Secondary | ICD-10-CM

## 2019-12-12 LAB — COMPREHENSIVE METABOLIC PANEL
ALT: 21 U/L (ref 0–44)
AST: 35 U/L (ref 15–41)
Albumin: 3.7 g/dL (ref 3.5–5.0)
Alkaline Phosphatase: 80 U/L (ref 38–126)
Anion gap: 10 (ref 5–15)
BUN: 10 mg/dL (ref 6–20)
CO2: 27 mmol/L (ref 22–32)
Calcium: 8.6 mg/dL — ABNORMAL LOW (ref 8.9–10.3)
Chloride: 106 mmol/L (ref 98–111)
Creatinine, Ser: 0.88 mg/dL (ref 0.44–1.00)
GFR calc Af Amer: >60 mL/min (ref >60)
GFR calc non Af Amer: >60 mL/min (ref >60)
Glucose, Bld: 96 mg/dL (ref 70–99)
Potassium: 3.7 mmol/L (ref 3.5–5.1)
Sodium: 143 mmol/L (ref 135–145)
Total Bilirubin: 0.5 mg/dL (ref 0.3–1.2)
Total Protein: 6.7 g/dL (ref 6.5–8.1)

## 2019-12-12 LAB — CBC WITH DIFFERENTIAL/PLATELET
Abs Immature Granulocytes: 0.01 10*3/uL (ref 0.00–0.07)
Basophils Absolute: 0 10*3/uL (ref 0.0–0.1)
Basophils Relative: 1 %
Eosinophils Absolute: 0 10*3/uL (ref 0.0–0.5)
Eosinophils Relative: 1 %
HCT: 36.4 % (ref 36.0–46.0)
Hemoglobin: 11.6 g/dL — ABNORMAL LOW (ref 12.0–15.0)
Immature Granulocytes: 1 %
Lymphocytes Relative: 23 %
Lymphs Abs: 0.5 10*3/uL — ABNORMAL LOW (ref 0.7–4.0)
MCH: 28.7 pg (ref 26.0–34.0)
MCHC: 31.9 g/dL (ref 30.0–36.0)
MCV: 90.1 fL (ref 80.0–100.0)
Monocytes Absolute: 0.2 10*3/uL (ref 0.1–1.0)
Monocytes Relative: 9 %
Neutro Abs: 1.3 10*3/uL — ABNORMAL LOW (ref 1.7–7.7)
Neutrophils Relative %: 65 %
Platelets: 48 10*3/uL — ABNORMAL LOW (ref 150–400)
RBC: 4.04 MIL/uL (ref 3.87–5.11)
RDW: 15.8 % — ABNORMAL HIGH (ref 11.5–15.5)
WBC: 2.1 10*3/uL — ABNORMAL LOW (ref 4.0–10.5)
nRBC: 0 % (ref 0.0–0.2)

## 2019-12-12 MED ORDER — DIPHENHYDRAMINE HCL 25 MG PO CAPS: 25.0000 mg | ORAL_CAPSULE | Freq: Once | ORAL | Status: DC

## 2019-12-12 MED ORDER — HEPARIN SOD (PORK) LOCK FLUSH 100 UNIT/ML IV SOLN
500.0000 [IU] | Freq: Once | INTRAVENOUS | Status: AC | PRN
  Administered 2019-12-12: 500 [IU]
  Filled 2019-12-12: qty 5

## 2019-12-12 MED ORDER — DIPHENHYDRAMINE HCL 25 MG PO CAPS
ORAL_CAPSULE | ORAL | Status: AC
  Filled 2019-12-12: qty 1

## 2019-12-12 MED ORDER — ACETAMINOPHEN 325 MG PO TABS
650.0000 mg | ORAL_TABLET | Freq: Once | ORAL | Status: AC
  Administered 2019-12-12: 650 mg via ORAL

## 2019-12-12 MED ORDER — SODIUM CHLORIDE 0.9% FLUSH
10.0000 mL | INTRAVENOUS | Status: DC | PRN
  Administered 2019-12-12: 10 mL
  Filled 2019-12-12: qty 10

## 2019-12-12 MED ORDER — DENOSUMAB 120 MG/1.7ML ~~LOC~~ SOLN
SUBCUTANEOUS | Status: AC
  Filled 2019-12-12: qty 1.7

## 2019-12-12 MED ORDER — SODIUM CHLORIDE 0.9 % IV SOLN
Freq: Once | INTRAVENOUS | Status: AC
  Filled 2019-12-12: qty 250

## 2019-12-12 MED ORDER — DENOSUMAB 120 MG/1.7ML ~~LOC~~ SOLN
120.0000 mg | Freq: Once | SUBCUTANEOUS | Status: AC
  Administered 2019-12-12: 120 mg via SUBCUTANEOUS

## 2019-12-12 MED ORDER — TRASTUZUMAB-DKST CHEMO 150 MG IV SOLR
6.0000 mg/kg | Freq: Once | INTRAVENOUS | Status: AC
  Administered 2019-12-12: 399 mg via INTRAVENOUS
  Filled 2019-12-12: qty 19

## 2019-12-12 MED ORDER — ACETAMINOPHEN 325 MG PO TABS
ORAL_TABLET | ORAL | Status: AC
  Filled 2019-12-12: qty 2

## 2019-12-12 NOTE — Patient Instructions (Signed)
[facility] °Discharge Instructions for Patients Receiving Chemotherapy ° °Today you received the following chemotherapy agents Trastuzumab ° °To help prevent nausea and vomiting after your treatment, we encourage you to take your nausea medication as directed. °  °If you develop nausea and vomiting that is not controlled by your nausea medication, call the clinic.  ° °BELOW ARE SYMPTOMS THAT SHOULD BE REPORTED IMMEDIATELY: °· *FEVER GREATER THAN 100.5 F °· *CHILLS WITH OR WITHOUT FEVER °· NAUSEA AND VOMITING THAT IS NOT CONTROLLED WITH YOUR NAUSEA MEDICATION °· *UNUSUAL SHORTNESS OF BREATH °· *UNUSUAL BRUISING OR BLEEDING °· TENDERNESS IN MOUTH AND THROAT WITH OR WITHOUT PRESENCE OF ULCERS °· *URINARY PROBLEMS °· *BOWEL PROBLEMS °· UNUSUAL RASH °Items with * indicate a potential emergency and should be followed up as soon as possible. ° °Feel free to call the clinic should you have any questions or concerns. The clinic phone number is [phone]. ° °Please show the CHEMO ALERT CARD at check-in to the Emergency Department and triage nurse. ° ° °

## 2019-12-12 NOTE — Telephone Encounter (Signed)
Per MD ok to proceed with Xgeva with noted calcium of 8.6. pt being advised to maintain daily intake of 1200 mg of calcium as well as for the next 3 days she is to chew 2 tums 3 times a day ( in addition to daily calcium

## 2019-12-12 NOTE — Patient Instructions (Signed)
Tunneled Central Venous Catheter Flushing Guide  It is important to flush your tunneled central venous catheter each time you use it, both before and after you use it. Flushing your catheter will help prevent it from clogging. What are the risks? Risks may include:  Infection.  Air getting into the catheter and bloodstream. Supplies needed:  A clean pair of gloves.  A disinfecting wipe. Use an alcohol wipe, chlorhexidine wipe, or iodine wipe as told by your health care provider.  A 10 mL syringe that has been prefilled with saline solution.  An empty 10 mL syringe, if a substance called heparin was injected into your catheter. How to flush your catheter When you flush your catheter, make sure you follow any specific instructions from your health care provider or the manufacturer. These are general guidelines. Flushing your catheter before use If there is heparin in your catheter: 1. Wash your hands with soap and water. 2. Put on gloves. 3. Scrub the injection cap for a minimum of 15 seconds with a disinfecting wipe. 4. Unclamp the catheter. 5. Attach the empty syringe to the injection cap. 6. Pull the syringe plunger back and withdraw 10 mL of blood. 7. Place the syringe into an appropriate waste container. 8. Scrub the injection cap for 15 seconds with a disinfecting wipe. 9. Attach the prefilled syringe to the injection cap. 10. Flush the catheter by pushing the plunger forward until all the liquid from the syringe is in the catheter. 11. Remove the syringe from the injection cap. 12. Clamp the catheter. If there is no heparin in your catheter: 1. Wash your hands with soap and water. 2. Put on gloves. 3. Scrub the injection cap for 15 seconds with a disinfecting wipe. 4. Unclamp the catheter. 5. Attach the prefilled syringe to the injection cap. 6. Flush the catheter by pushing the plunger forward until 5 mL of the liquid from the syringe is in the catheter. 7. Pull back on  the syringe until you see blood in the catheter. 8. If you have been asked to collect any blood, follow your health care provider's instructions. Otherwise, flush the catheter with the rest of the solution from the syringe. 9. Remove the syringe from the injection cap. 10. Clamp the catheter.  Flushing your catheter after use 1. Wash your hands with soap and water. 2. Put on gloves. 3. Scrub the injection cap for 15 seconds with a disinfecting wipe. 4. Unclamp the catheter. 5. Attach the prefilled syringe to the injection cap. 6. Flush the catheter by pushing the plunger forward until all of the liquid from the syringe is in the catheter. 7. Remove the syringe from the injection cap. 8. Clamp the catheter. Problems and solutions  If blood cannot be completely cleared from the injection cap, you may need to have the injection cap replaced.  If the catheter is difficult to flush, use the pulsing method. The pulsing method involves pushing only a few milliliters of solution into the catheter at a time and pausing between pushes.  If you do not see blood in the catheter when you pull back on the syringe, change your body position, such as by raising your arms above your head. Take a deep breath and cough. Then, pull back on the syringe. If you still do not see blood, flush the catheter with a small amount of solution. Then, change positions again and take a breath or cough. Pull back on the syringe again. If you still do not see   blood, finish flushing the catheter and contact your health care provider. Do not use your catheter until your health care provider says it is okay. General tips  Have someone help you flush your catheter, if possible.  Do not force fluid through your catheter.  Do not use a syringe that is larger or smaller than 10 mL. Using a smaller syringe can make the catheter burst.  Do not use your catheter without flushing it first if it has heparin in it. Contact a health  care provider if:  You cannot see any blood in the catheter when you flush it before using it.  Your catheter is difficult to flush. Get help right away if:  You cannot flush the catheter.  The catheter leaks when you flush it or when there is fluid in it.  There are cracks or breaks in the catheter. Summary  It is important to flush your tunneled central venous catheter each time you use it, both before and after you use it.  Scrub the injection cap for 15 seconds with a disinfecting wipe before and after you flush it.  When you flush your catheter, make sure you follow any specific instructions from your health care provider or the manufacturer.  Get help right away if you cannot flush the catheter. This information is not intended to replace advice given to you by your health care provider. Make sure you discuss any questions you have with your health care provider. Document Revised: 08/11/2019 Document Reviewed: 02/01/2019 Elsevier Patient Education  2020 Elsevier Inc.  

## 2019-12-12 NOTE — Progress Notes (Addendum)
Nutrition  Patient identified on Malnutrition Screening report for weight loss.  RD had planned to meet with patient during infusion but patient infusion had already completed before RD could see patient face to face today.    RD called and left message with call back number.  Patient returned RD's call.    59 year old female with metastatic triple positive breast cancer.  Patient receiving herceptin, denosumab/xgeva and anastrozole.  Noted patient has been receiving PT for balance issues.  Patient reports that she has been trying to lose some weight.  Reports that she only eats 1 meal per day (usually around 2:30pm).  Has 5-6 cookies for dinner.  Recently purchased boost original to start drinking.  1 meal per day is usually [place] food, sub sandwich, salads, Cookout, beanie weanies.  Reports issues with constipation. Denies any other nutrition impact symptom.    Medications: colace, miralax  Labs reviewed  Weight 141 lb 11.2 oz on 1/12 Height: 65 inches BMI: 23 Noted weight 08/30/2019 150 lb  6% weight loss in the last 3 1/2 months, concerning in the setting of metastatic cancer  Intervention: Encouraged weight maintenance vs intentional weight loss. Discussed high calorie oral nutrition supplements to try and encouraged at least 1 time per day Encouraged good sources of protein and examples provided Provided strategies to help with constipation.  Contact information provided to patient Patient will reach out if needed in the future.   No follow-up visits planned.  Mathis Cashman B. Zenia Resides, [place], [place] Registered Dietitian [phone] (pager)

## 2019-12-13 ENCOUNTER — Telehealth: Payer: Self-pay | Admitting: Oncology

## 2019-12-13 ENCOUNTER — Ambulatory Visit: Payer: Medicaid Other

## 2019-12-13 ENCOUNTER — Other Ambulatory Visit: Payer: Medicaid Other

## 2019-12-13 ENCOUNTER — Ambulatory Visit: Payer: Medicaid Other | Admitting: Oncology

## 2019-12-13 LAB — CANCER ANTIGEN 27.29: CA 27.29: 15.2 U/mL (ref 0.0–38.6)

## 2019-12-13 NOTE — Telephone Encounter (Signed)
I left a message regarding schedule  

## 2019-12-19 ENCOUNTER — Other Ambulatory Visit: Payer: Self-pay

## 2019-12-19 ENCOUNTER — Encounter: Payer: Self-pay | Admitting: Physical Therapy

## 2019-12-19 ENCOUNTER — Ambulatory Visit: Payer: Medicaid Other | Admitting: Physical Therapy

## 2019-12-19 DIAGNOSIS — R2681 Unsteadiness on feet: Secondary | ICD-10-CM

## 2019-12-19 DIAGNOSIS — R2689 Other abnormalities of gait and mobility: Secondary | ICD-10-CM

## 2019-12-19 DIAGNOSIS — R293 Abnormal posture: Secondary | ICD-10-CM

## 2019-12-19 DIAGNOSIS — M6281 Muscle weakness (generalized): Secondary | ICD-10-CM

## 2019-12-19 NOTE — Patient Instructions (Signed)
Access Code: [email or web address]  URL: [email or web address]  Date: 11/10/2019  Prepared by: Willow Ora   Exercises Sit to Stand - 10 reps - 2 sets - 1x daily - 7x weekly Seated March - 10 reps - 1-2 sets - 1x daily - 5x weekly Seated Ankle Dorsiflexion with Resistance - 10 reps - 1-2 sets - 1x daily - 5x weekly Standing Hip Abduction - 10 reps - 1 sets - 1x daily - 5x weekly Standing Hip Extension - 10 reps - 1 sets - 1x daily - 5x weekly Heel Toe Raises with Counter Support - 10 reps - 1 sets - 1x daily - 5x weekly Alternating Step Taps with Counter Support - 10 reps - 1-2 sets - 1x daily - 5x weekly  Printed ex tracker sheet to go with above program to assist pt with tracking ex compliance at home.

## 2019-12-20 NOTE — Therapy (Signed)
[facility] [address] [place], [place], [address] Phone: [phone]   Fax:  [phone]  Physical Therapy Treatment  Patient Details  Name: [name] MRN: [id number] Date of Birth: [birth year] Referring Provider (PT): Wilber Bihari, NP   Encounter Date: 12/19/2019  PT End of Session - 12/19/19 0808    Visit Number  14    Number of Visits  23   per recert [id number]   Date for PT Re-Evaluation  02/12/20    Authorization Type  Medicaid (3 visits 12/07/2019-12/27/2019)    Authorization Time Period  08/31/2019-09/20/2019; 09/28/19-10/25/19 (8 visits); 2 visits approved 12/11-12/22/2020; 3 visits 12/07/19/12/27/19    Authorization - Visit Number  2    Authorization - Number of Visits  3    PT Start Time  0805    PT Stop Time  0845    PT Time Calculation (min)  40 min    Equipment Utilized During Treatment  Gait belt    Activity Tolerance  Patient tolerated treatment well;No increased pain;Patient limited by fatigue   Pt requires brief, multiple seated rest breaks between exercises   Behavior During Therapy  [facility] for tasks assessed/performed   decreased safety awareness; needs repeated cues for braking technique, hand placement with transfers      Past Medical History:  Diagnosis Date  . Alcohol abuse   . Anemia    during chemo  . Anxiety    At age 61  . Arthritis Dx 2010  . Bipolar disorder ([place])   . Bronchitis   . Cancer ([place])    breast mets to brain  . Chronic pain   . Complication of anesthesia   . Depression   . Family history of adverse reaction to anesthesia    MOther had PONV  . Fibromyalgia Dx 2005  . GERD (gastroesophageal reflux disease)   . Headache    hx  migraines  . Lymphedema of left arm   . Opiate dependence ([place])   . PONV (postoperative nausea and vomiting)   . Port-A-Cath in place   . PTSD (post-traumatic stress disorder)     Past Surgical History:  Procedure Laterality Date  .  APPLICATION OF CRANIAL NAVIGATION N/A 08/14/2016   Procedure: APPLICATION OF CRANIAL NAVIGATION;  Surgeon: Erline Levine, MD;  Location: [facility];  Service: Neurosurgery;  Laterality: N/A;  . BREAST RECONSTRUCTION Left    with silicone implant  . COLONOSCOPY W/ POLYPECTOMY    . CRANIOTOMY N/A 08/14/2016   Procedure: CRANIOTOMY TUMOR EXCISION WITH Lucky Rathke;  Surgeon: Erline Levine, MD;  Location: [facility];  Service: Neurosurgery;  Laterality: N/A;  . FIBULA FRACTURE SURGERY Left   . MASTECTOMY Left   . RADIOLOGY WITH ANESTHESIA N/A 07/23/2016   Procedure: MRI OF BRAIN WITH AND WITHOUT;  Surgeon: Medication Radiologist, MD;  Location: [facility];  Service: Radiology;  Laterality: N/A;  . RADIOLOGY WITH ANESTHESIA N/A 09/08/2016   Procedure: MRI OF BRAIN WITH AND WITHOUT CONTRAST;  Surgeon: Medication Radiologist, MD;  Location: [facility];  Service: Radiology;  Laterality: N/A;  . RADIOLOGY WITH ANESTHESIA N/A 12/10/2016   Procedure: MRI OF BRAIN WITH AND WITHOUT;  Surgeon: Medication Radiologist, MD;  Location: [facility];  Service: Radiology;  Laterality: N/A;  . RADIOLOGY WITH ANESTHESIA N/A 03/02/2017   Procedure: MRI of BRAIN W and W/OUT CONTRAST;  Surgeon: Medication Radiologist, MD;  Location: [facility];  Service: Radiology;  Laterality: N/A;  . RADIOLOGY WITH ANESTHESIA N/A 07/29/2017   Procedure:  RADIOLOGY WITH ANESTHESIA MRI OF BRAIN WITH AND WITHOUT CONTRAST;  Surgeon: Radiologist, Medication, MD;  Location: [facility];  Service: Radiology;  Laterality: N/A;  . RADIOLOGY WITH ANESTHESIA N/A 12/07/2017   Procedure: MRI WITH ANESTHESIA OF BRAIN WITH AND WITHOUT CONTRAST;  Surgeon: Radiologist, Medication, MD;  Location: [facility];  Service: Radiology;  Laterality: N/A;  . RADIOLOGY WITH ANESTHESIA N/A 04/07/2018   Procedure: MRI OF BRAIN WITH AND WITHOUT CONTRAST;  Surgeon: Radiologist, Medication, MD;  Location: [facility];  Service: Radiology;  Laterality: N/A;  . RADIOLOGY WITH ANESTHESIA N/A 08/23/2018   Procedure: MRI  WITH ANESTHESIA OF THE BRAIN WITH AND WITHOUT;  Surgeon: Radiologist, Medication, MD;  Location: [facility];  Service: Radiology;  Laterality: N/A;  . RADIOLOGY WITH ANESTHESIA N/A 01/24/2019   Procedure: MRI OF BRAIN WITH AND WITHOUT CONTRAST;  Surgeon: Radiologist, Medication, MD;  Location: [facility];  Service: Radiology;  Laterality: N/A;  . RADIOLOGY WITH ANESTHESIA N/A 07/06/2019   Procedure: MRI WITH ANESTHESIA OF BRAIN WITH AND WITHOUT CONTRAST;  Surgeon: Radiologist, Medication, MD;  Location: [facility];  Service: Radiology;  Laterality: N/A;  . right power port placement Right     There were no vitals filed for this visit.  Subjective Assessment - 12/19/19 0808    Subjective  No new falls. Having trouble with tracking exercises and getting them done.    Pertinent History  Per [employer] lymphedema eval 07/2019:  Treated here in March of last year currently with a reid sleeve, day sleeve and compression pump. Lt mastectomy and ALND with reconstruction 07/08/15 for T4N2 stage IIIB IDC. Metastatic disease in the brain s/p craniotomy 08/14/16. Chemo completed in 2017 Trastuzumab and Pertuzumab, docetaxel. Radiation completed 2017. Stable bone lesions to the Rt scapula, Lt iliac crest, L4, Tspine. Also probable in the liver and lungs. Pt is also bipolar and a recovered drug and alchohol user.  Current treatment TDM-1, denosumab and anastrozole.    Patient Stated Goals  To get more stability back in legs    Currently in Pain?  No/denies             [facility] Adult PT Treatment/Exercise - 12/19/19 0814      Transfers   Transfers  Sit to Stand;Stand to Sit    Sit to Stand  5: Supervision;Without upper extremity assist;From bed;From chair/3-in-1;With upper extremity assist    Stand to Sit  5: Supervision;With upper extremity assist;To chair/3-in-1;To bed;Without upper extremity assist    Comments  continued to need cues with standing from rollator as she tried to unlock the brakes prior to standing. pt  needed cues for use of brakes with sit<>stand transfers with chairs/mat as well.        Ambulation/Gait   Ambulation/Gait  Yes    Ambulation/Gait Assistance  5: Supervision;4: Min guard    Ambulation/Gait Assistance Details  cues/assistance needed to slow gait down with cues to keep hands on rollator brakes. when slowing pt down she resorted to a short, stopping gait pattern.     Ambulation Distance (Feet)  345 Feet    Assistive device  Rollator    Gait Pattern  Step-through pattern;Decreased stride length;Shuffle;Narrow base of support;Poor foot clearance - left;Poor foot clearance - right    Ambulation Surface  Level;Indoor      Knee/Hip Exercises: Aerobic   Other Aerobic  Scifit UE/LE's level 4.0 for 2.5 minutes, then 2.0 for an additonal 2.5 minute work, 2 minute rest x 2 reps, with rpm >/= 30 for  strengthening and activity tolerance.           Balance Exercises - 12/19/19 0832      Balance Exercises: Standing   Standing Eyes Closed  Wide (BOA);Head turns;Foam/compliant surface;Other reps (comment);30 secs;Limitations    Standing Eyes Closed Limitations  on one inch foam with fingertip support for balance: min guard assist to min assist for balance. EC no head movements, progressing to EC head movements left<>right, up<>down.     Partial Tandem Stance  Eyes open;Intermittent upper extremity support;3 reps;20 secs;Limitations    Partial Tandem Stance Limitations  on floor with intermittent touch to rollator for balance, min guard to min assist for balance. 3 reps each foot forward.          PT Education - 12/19/19 1630    Education Details  provided new copy of HEP with exercise tracking forms to assist with compliance at home    Person(s) Educated  Patient    Methods  Explanation;Demonstration;Verbal cues;Handout    Comprehension  Verbalized understanding;Returned demonstration;Verbal cues required;Need further instruction       PT Short Term Goals - 11/14/19 1631      PT  SHORT TERM GOAL #1   Title  Pt will be independent with progression of HEP to address balance, strength, and gait.  TARGET 3 weeks (12/31/2019)    Baseline  Updated HEP at 11/10/2019 visit; pt requires cues for updated HEP    Time  3    Period  Weeks    Status  Revised    Target Date  12/31/19      PT SHORT TERM GOAL #2   Title  Pt will improve 5x sit<>stand test to less than or equal to 11.5 seconds to demonstrate improved lower extremity functional strength.    Baseline  5x sit<>stand:  13.75 sec 11/14/2019    Time  3    Period  Weeks    Status  New    Target Date  12/31/19      PT SHORT TERM GOAL #3   Title  Pt will stand for at least 10 minutes during PT sessions, without sitting breaks, for improved participation in ADLs and household tasks.    Baseline  11/14/2019:  Pt requires seated rest break after 2-3 minutes of standing during PT sessions.    Time  3    Period  Weeks    Status  New    Target Date  12/31/19        PT Long Term Goals - 11/14/19 1643      PT LONG TERM GOAL #1   Title  Pt will be independent with progression of HEP and verbalize plans for continued community fitness upon d/c from PT.  TARGET 01/28/2020    Baseline  Updates to HEP provided at 11/10/2019 visit    Time  7    Period  Weeks    Status  Revised    Target Date  01/28/20      PT LONG TERM GOAL #2   Title  Pt will improve Berg Balance score to at least 45/56 for decreased fall risk.    Baseline  Score 39/56 11/14/2019; scores <45/56 indicate increased fall risk    Time  7    Status  Revised    Target Date  01/28/20      PT LONG TERM GOAL #3   Title  Pt will improve gait velocity to at least 2.62 ft/sec for improved gait efficiency and safety.  Baseline  2.39 ft/sec 11/14/2019 with rollator    Time  7    Period  Weeks    Status  Revised    Target Date  01/28/20      PT LONG TERM GOAL #4   Title  Pt will negotiate 1 curb step to simulate step up into home, no rail, with supervision,  for improved safety with curbs/home entry.    Baseline  11/14/2019:  requires min assist and cues for safe curb negotiation    Time  7    Period  Weeks    Status  Revised      PT LONG TERM GOAL #5   Title  Pt will negotiate at least 4 steps with one handrail with supervision, for improved safety with stair negotiation.    Baseline  min assist for steps 1 handrail 11/14/2019    Time  7    Period  Weeks    Status  New    Target Date  01/28/20            Plan - 12/19/19 0809    Clinical Impression Statement  Today's skilled session continued to focus on gait/safety with rollator, strengthening and balance reactions. Pt continues to need cues for safety with use of rollator. Assist needed for unsupported balance. Provided pt with an HEP tracker form to assist with compliance at home.    Personal Factors and Comorbidities  Comorbidity 3+    Comorbidities  See PMH (subjective); metastatic breast cancer, hx of bipolar and anxiety    Examination-Activity Limitations  Stairs   Curbs   Examination-Participation Restrictions  --   Work   Stability/Clinical Decision Making  Stable/Uncomplicated    Rehab Potential  Good    PT Frequency  Other (comment)   1x/wk for 3 weeks, then 2x/wk for 4 weeks   PT Duration  --   7 weeks total POC   PT Treatment/Interventions  ADLs/Self Care Home Management;Neuromuscular re-education;Gait training;Stair training;Patient/family education;Therapeutic activities;Functional mobility training;Therapeutic exercise;Balance training;DME Instruction    PT Next Visit Plan  check goals to submit to Medicaid for additional visits.    Consulted and Agree with Plan of Care  Patient       Patient will benefit from skilled therapeutic intervention in order to improve the following deficits and impairments:  Abnormal gait, Difficulty walking, Decreased mobility, Decreased strength, Decreased balance  Visit Diagnosis: Muscle weakness (generalized)  Other  abnormalities of gait and mobility  Unsteadiness on feet  Abnormal posture     Problem List Patient Active Problem List   Diagnosis Date Noted  . Brain metastases ([place]) 11/01/2019  . Thrombocytopenia ([place]) 08/09/2019  . Port-A-Cath in place 06/20/2019  . 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 S/P mastectomy, left 12/09/2018  . S/P breast reconstruction, left 12/09/2018  . S/P radiation therapy 12/09/2018  . GERD (gastroesophageal reflux disease) 12/10/2017  . Edema 11/09/2017  . Sensorineural hearing loss (SNHL) of both ears 11/05/2017  . Vitamin D deficiency 01/18/2017  . Bipolar I disorder, most recent episode depressed ([place]) 12/08/2016  . Adjustment disorder with anxiety 12/06/2016  . History of cancer metastatic to brain 07/27/2016  . Iron deficiency anemia 06/26/2016  . Bone metastases ([place]) 06/03/2016  . Primary cancer of lower-inner quadrant of left female breast ([place]) 06/01/2016  . Malignant neoplasm of female breast ([place]) 07/01/2015  . Current smoker 03/28/2015  . Goals of care, counseling/discussion 03/28/2015  . Seasonal allergies 03/28/2015  . Anxiety state  02/28/2015  . Fibromyalgia 02/28/2015  . Family history of diabetes mellitus 02/28/2015  . H/O alcohol abuse     Willow Ora, [place], [facility] [address], [place], [place] [address] [phone] 12/20/19, 12:42 PM   Name: [name] MRN: [id number] Date of Birth: Nov 02, 1961

## 2019-12-20 NOTE — Telephone Encounter (Signed)
No entry 

## 2019-12-23 ENCOUNTER — Ambulatory Visit (HOSPITAL_COMMUNITY)
Admission: RE | Admit: 2019-12-23 | Discharge: 2019-12-23 | Disposition: A | Discharge status: Home / Self Care | Payer: Medicaid Other | Source: Ambulatory Visit | Attending: Internal Medicine | Admitting: Internal Medicine

## 2019-12-23 DIAGNOSIS — Z01812 Encounter for preprocedural laboratory examination: Secondary | ICD-10-CM | POA: Diagnosis not present

## 2019-12-23 DIAGNOSIS — Z20822 Contact with and (suspected) exposure to covid-19: Secondary | ICD-10-CM | POA: Diagnosis not present

## 2019-12-23 LAB — SARS CORONAVIRUS 2 (TAT 6-24 HRS): SARS Coronavirus 2: NEGATIVE

## 2019-12-25 ENCOUNTER — Encounter (HOSPITAL_COMMUNITY): Payer: Self-pay | Admitting: *Deleted

## 2019-12-25 ENCOUNTER — Encounter (HOSPITAL_COMMUNITY): Payer: Self-pay | Admitting: Physician Assistant

## 2019-12-25 ENCOUNTER — Other Ambulatory Visit: Payer: Self-pay

## 2019-12-25 NOTE — Anesthesia Preprocedure Evaluation (Deleted)
Anesthesia Evaluation    Reviewed: Allergy & Precautions, Patient's Chart, lab work & pertinent test results  History of Anesthesia Complications (+) PONV, Family history of anesthesia reaction and history of anesthetic complications  Airway        Dental   Pulmonary Current Smoker,           Cardiovascular negative cardio ROS       Neuro/Psych  Headaches, PSYCHIATRIC DISORDERS Anxiety Depression Bipolar Disorder Brain metastases    GI/Hepatic Neg liver ROS, GERD  Medicated,  Endo/Other  negative endocrine ROS  Renal/GU negative Renal ROS     Musculoskeletal  (+) Arthritis , Fibromyalgia -  Abdominal   Peds  Hematology  (+) Blood dyscrasia (Thrombocytopenia), anemia ,   Anesthesia Other Findings Day of surgery medications reviewed with the patient.  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 She had been maintained on T-DM1 but was noted to have low platelets, 37k on 11/01/19. At that time the decision was made to stop T-DMI and switch to herceptin. Per followup note by Dr. [name] on 12/12/19, "Dayamin is clinically very stable.  She is tolerating the  Herceptin well and has an excellent cardiac function.  She has no dental or other issues related to the denosumab/Xgeva which she is currently receiving every 8 weeks. She tolerates anastrozole well." Platelets at that time were 48k. Remainder of labs from 12/12/19 unremarkable.  TTE 12/06/19: 1. Left ventricular ejection fraction, by visual estimation, is 60 to 65%. The left ventricle has normal function. Left ventricular septal wall thickness was normal. There is no left ventricular hypertrophy.  2. Left ventricular diastolic parameters are consistent with Grade I diastolic dysfunction (impaired relaxation).  3. The left ventricle has no regional wall motion abnormalities.  4. Global right ventricle has normal systolic function.The right ventricular size is normal. No increase in right ventricular wall thickness.  5. Left atrial size was normal.  6. Right atrial size was normal.  7. The mitral valve is normal in structure. No evidence of mitral valve regurgitation.  8. The tricuspid valve is normal in structure.  9. The aortic valve is tricuspid. Aortic valve regurgitation is trivial. 10. The pulmonic valve was grossly normal. Pulmonic valve regurgitation is not visualized. 11. The average left ventricular global longitudinal strain is -18.8 %. 12. The interatrial septum was not well visualized.)       Anesthesia Quick Evaluation

## 2019-12-25 NOTE — Progress Notes (Signed)
Patient denies shortness of breath, fever, cough and chest pain.  PCP - Dr Charlott Rakes Cardiologist - Dr Haroldine Laws  Chest x-ray - denies EKG - denies Stress Test - denies ECHO - 12/06/19 Cardiac Cath - denies  Anesthesia review: Yes  STOP now taking any Aspirin (unless otherwise instructed by your surgeon), Aleve, Naproxen, Ibuprofen, Motrin, Advil, Goody's, BC's, all herbal medications, fish oil, and all vitamins.   Coronavirus Screening Covid test on 12/23/19 was negative.  Patient verbalized understanding of instructions that were given via phone.

## 2019-12-25 NOTE — Progress Notes (Signed)
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 She had been maintained on T-DM1 but was noted to have low platelets, 37k on 11/01/19. At that time the decision was made to stop T-DMI and switch to herceptin. Per followup note by Dr. [name] on 12/12/19, "[name] is clinically very stable.  She is tolerating the Herceptin well and has an excellent cardiac function.  She has no dental or other issues related to the denosumab/Xgeva which she is currently receiving every 8 weeks. She tolerates anastrozole well." Platelets at that time were 48k. Remainder of labs from 12/12/19 unremarkable.  TTE 12/06/19: 1. Left ventricular ejection fraction, by visual estimation, is 60 to 65%. The left ventricle has normal function. Left ventricular septal wall thickness was normal. There is no left ventricular hypertrophy.  2. Left ventricular diastolic parameters are consistent with Grade I diastolic dysfunction (impaired relaxation).  3. The left ventricle has no regional wall motion abnormalities.  4. Global right ventricle has normal systolic function.The right ventricular size is normal. No increase in right ventricular wall thickness.  5. Left atrial size was normal.  6. Right atrial size was normal.  7. The mitral valve is normal in structure. No evidence of mitral valve regurgitation.  8. The tricuspid valve is normal in structure.  9. The aortic valve is tricuspid. Aortic valve regurgitation is trivial. 10. The pulmonic valve was grossly normal. Pulmonic valve regurgitation is not visualized. 11. The average left ventricular global longitudinal strain is -18.8 %. 12. The interatrial septum was not well visualized.  [name]/Anesthesiology Phone [phone] 12/25/2019 12:12 PM

## 2019-12-26 ENCOUNTER — Ambulatory Visit (HOSPITAL_COMMUNITY): Admission: RE | Admit: 2019-12-26 | Payer: Medicaid Other | Source: Home / Self Care

## 2019-12-26 ENCOUNTER — Encounter (HOSPITAL_COMMUNITY): Payer: Self-pay

## 2019-12-26 ENCOUNTER — Ambulatory Visit (HOSPITAL_COMMUNITY): Admission: RE | Admit: 2019-12-26 | Payer: Medicaid Other | Source: Ambulatory Visit

## 2019-12-26 HISTORY — DX: Other specified postprocedural states: Z98.890

## 2019-12-26 SURGERY — MRI WITH ANESTHESIA
Anesthesia: General

## 2019-12-27 ENCOUNTER — Ambulatory Visit: Payer: Medicaid Other | Admitting: Physical Therapy

## 2019-12-27 ENCOUNTER — Other Ambulatory Visit: Payer: Self-pay

## 2019-12-27 ENCOUNTER — Encounter: Payer: Self-pay | Admitting: Physical Therapy

## 2019-12-27 ENCOUNTER — Telehealth: Payer: Self-pay | Admitting: *Deleted

## 2019-12-27 ENCOUNTER — Other Ambulatory Visit: Payer: Self-pay | Admitting: Oncology

## 2019-12-27 DIAGNOSIS — R293 Abnormal posture: Secondary | ICD-10-CM

## 2019-12-27 DIAGNOSIS — M6281 Muscle weakness (generalized): Secondary | ICD-10-CM

## 2019-12-27 DIAGNOSIS — Z1231 Encounter for screening mammogram for malignant neoplasm of breast: Secondary | ICD-10-CM

## 2019-12-27 DIAGNOSIS — R2689 Other abnormalities of gait and mobility: Secondary | ICD-10-CM

## 2019-12-27 DIAGNOSIS — R2681 Unsteadiness on feet: Secondary | ICD-10-CM

## 2019-12-27 NOTE — Telephone Encounter (Signed)
This RN received VM from pt stating she was unable to have her MRI under sedation due to failure with transportation.  She contacted the MRI department and they informed her she would need to reschedule  This RN contacted Central Scheduling - and was transferred to [facility]- and received her VM.  Detailed message left per above need.

## 2019-12-27 NOTE — Therapy (Addendum)
[facility] [address] [place], [place], [address] Phone: [phone]   Fax:  [phone]  Physical Therapy Treatment  Patient Details  Name: [name] MRN: [id number] Date of Birth: Nov 02, 1961 Referring Provider (PT): Wilber Bihari, NP   Encounter Date: 12/27/2019  PT End of Session - 12/27/19 0729    Visit Number  15    Number of Visits  23   per recert 68/09/5725   Date for PT Re-Evaluation  02/12/20    Authorization Type  Medicaid (3 visits 12/07/2019-12/27/2019)    Authorization Time Period  08/31/2019-09/20/2019; 09/28/19-10/25/19 (8 visits); 2 visits approved 12/11-12/22/2020; 3 visits 12/07/19/12/27/19    Authorization - Visit Number  3    Authorization - Number of Visits  3    PT Start Time  0720    PT Stop Time  0800    PT Time Calculation (min)  40 min    Equipment Utilized During Treatment  Gait belt    Activity Tolerance  Patient tolerated treatment well;No increased pain;Patient limited by fatigue   seated rest breaks needed through out session   Behavior During Therapy  [facility] for tasks assessed/performed   continues with decreased safety awareness      Past Medical History:  Diagnosis Date  . Alcohol abuse    none since 2013  . Anemia    during chemo  . Anxiety    At age 6  . Arthritis Dx 2010  . Bipolar disorder ([place])   . Bronchitis   . Cancer ([place])    breast mets to brain  . Chronic pain    resolved per patient 12/25/19  . Depression   . Family history of adverse reaction to anesthesia    MOther had PONV  . Fibromyalgia Dx 2005  . GERD (gastroesophageal reflux disease)   . Headache    hx  migraines  . Lymphedema of left arm   . Opiate dependence ([place])   . PONV (postoperative nausea and vomiting)   . Port-A-Cath in place   . PTSD (post-traumatic stress disorder)   . S/P endometrial ablation    in md's office    Past Surgical History:  Procedure Laterality Date  . APPLICATION OF  CRANIAL NAVIGATION N/A 08/14/2016   Procedure: APPLICATION OF CRANIAL NAVIGATION;  Surgeon: Erline Levine, MD;  Location: [facility];  Service: Neurosurgery;  Laterality: N/A;  . BREAST RECONSTRUCTION Left    with silicone implant  . COLONOSCOPY W/ POLYPECTOMY    . CRANIOTOMY N/A 08/14/2016   Procedure: CRANIOTOMY TUMOR EXCISION WITH Lucky Rathke;  Surgeon: Erline Levine, MD;  Location: [facility];  Service: Neurosurgery;  Laterality: N/A;  . FIBULA FRACTURE SURGERY Left   . MASTECTOMY Left   . RADIOLOGY WITH ANESTHESIA N/A 07/23/2016   Procedure: MRI OF BRAIN WITH AND WITHOUT;  Surgeon: Medication Radiologist, MD;  Location: [facility];  Service: Radiology;  Laterality: N/A;  . RADIOLOGY WITH ANESTHESIA N/A 09/08/2016   Procedure: MRI OF BRAIN WITH AND WITHOUT CONTRAST;  Surgeon: Medication Radiologist, MD;  Location: [facility];  Service: Radiology;  Laterality: N/A;  . RADIOLOGY WITH ANESTHESIA N/A 12/10/2016   Procedure: MRI OF BRAIN WITH AND WITHOUT;  Surgeon: Medication Radiologist, MD;  Location: [facility];  Service: Radiology;  Laterality: N/A;  . RADIOLOGY WITH ANESTHESIA N/A 03/02/2017   Procedure: MRI of BRAIN W and W/OUT CONTRAST;  Surgeon: Medication Radiologist, MD;  Location: [facility];  Service: Radiology;  Laterality: N/A;  . RADIOLOGY WITH  ANESTHESIA N/A 07/29/2017   Procedure: RADIOLOGY WITH ANESTHESIA MRI OF BRAIN WITH AND WITHOUT CONTRAST;  Surgeon: Radiologist, Medication, MD;  Location: [facility];  Service: Radiology;  Laterality: N/A;  . RADIOLOGY WITH ANESTHESIA N/A 12/07/2017   Procedure: MRI WITH ANESTHESIA OF BRAIN WITH AND WITHOUT CONTRAST;  Surgeon: Radiologist, Medication, MD;  Location: [facility];  Service: Radiology;  Laterality: N/A;  . RADIOLOGY WITH ANESTHESIA N/A 04/07/2018   Procedure: MRI OF BRAIN WITH AND WITHOUT CONTRAST;  Surgeon: Radiologist, Medication, MD;  Location: [facility];  Service: Radiology;  Laterality: N/A;  . RADIOLOGY WITH ANESTHESIA N/A 08/23/2018   Procedure: MRI WITH ANESTHESIA  OF THE BRAIN WITH AND WITHOUT;  Surgeon: Radiologist, Medication, MD;  Location: [facility];  Service: Radiology;  Laterality: N/A;  . RADIOLOGY WITH ANESTHESIA N/A 01/24/2019   Procedure: MRI OF BRAIN WITH AND WITHOUT CONTRAST;  Surgeon: Radiologist, Medication, MD;  Location: [facility];  Service: Radiology;  Laterality: N/A;  . RADIOLOGY WITH ANESTHESIA N/A 07/06/2019   Procedure: MRI WITH ANESTHESIA OF BRAIN WITH AND WITHOUT CONTRAST;  Surgeon: Radiologist, Medication, MD;  Location: [facility];  Service: Radiology;  Laterality: N/A;  . right power port placement Right     There were no vitals filed for this visit.  Subjective Assessment - 12/27/19 0726    Subjective  No new complaints. No falls. Having some neck and shoulder pain "that's common in the morning".    Pertinent History  Per [employer] lymphedema eval 07/2019:  Treated here in March of last year currently with a reid sleeve, day sleeve and compression pump. Lt mastectomy and ALND with reconstruction 07/08/15 for T4N2 stage IIIB IDC. Metastatic disease in the brain s/p craniotomy 08/14/16. Chemo completed in 2017 Trastuzumab and Pertuzumab, docetaxel. Radiation completed 2017. Stable bone lesions to the Rt scapula, Lt iliac crest, L4, Tspine. Also probable in the liver and lungs. Pt is also bipolar and a recovered drug and alchohol user.  Current treatment TDM-1, denosumab and anastrozole.    Limitations  Other (comment)    Patient Stated Goals  To get more stability back in legs    Currently in Pain?  Yes    Pain Score  4     Pain Location  Shoulder   across the neck from shoulder to shoulder   Pain Orientation  Right;Left;Medial    Pain Descriptors / Indicators  Aching    Pain Type  Chronic pain    Pain Onset  More than a month ago    Pain Frequency  Intermittent    Aggravating Factors   pt reports she wakes up with the pain, it gets better during the day    Pain Relieving Factors  ibuprofen, Flexaril           [facility] Adult PT  Treatment/Exercise - 12/27/19 0733      Transfers   Transfers  Sit to Stand;Stand to Sit    Sit to Stand  5: Supervision;Without upper extremity assist;From bed;From chair/3-in-1;With upper extremity assist    Five time sit to stand comments   10.68 sec's with intermittent UE assist using standard height chair    Stand to Sit  5: Supervision;With upper extremity assist;To chair/3-in-1;To bed;Without upper extremity assist      Ambulation/Gait   Ambulation/Gait  Yes    Ambulation/Gait Assistance  5: Supervision    Ambulation/Gait Assistance Details  occasional cues for posture and rollator position with gait    Ambulation Distance (Feet)  650 Feet  Assistive device  Rollator    Gait Pattern  Step-through pattern;Decreased stride length;Shuffle;Narrow base of support;Poor foot clearance - left;Poor foot clearance - right    Ambulation Surface  Level;Indoor    Gait velocity  11.72 sec's= 2.79 ft/sec with rollator    Stairs  Yes    Stairs Assistance  4: Min guard    Stairs Assistance Details (indicate cue type and reason)  use of reciprocal pattern both ways with increased time/effort with descent needing min guard assist for balance/safety     Stair Management Technique  Alternating pattern;Forwards;One rail Right;Step to pattern    Number of Stairs  4    Height of Stairs  6    Ramp  Other (comment)   min guard assist   Ramp Details (indicate cue type and reason)  with rollator    Curb  4: Min assist    Curb Details (indicate cue type and reason)  with rollator      Berg Balance Test   Sit to Stand  Able to stand without using hands and stabilize independently    Standing Unsupported  Able to stand safely 2 minutes    Sitting with Back Unsupported but Feet Supported on Floor or Stool  Able to sit safely and securely 2 minutes    Stand to Sit  Sits safely with minimal use of hands    Transfers  Able to transfer safely, minor use of hands    Standing Unsupported with Eyes Closed  Able  to stand 10 seconds safely    Standing Ubsupported with Feet Together  Able to place feet together independently and stand for 1 minute with supervision    From Standing, Reach Forward with Outstretched Arm  Can reach forward >12 cm safely (5")   5 inches with max cues to task   From Standing Position, Pick up Object from [place] to pick up shoe safely and easily    From Standing Position, Turn to Look Behind Over each Shoulder  Looks behind one side only/other side shows less weight shift   right >left   Turn 360 Degrees  Able to turn 360 degrees safely but slowly   4-5 seconds both ways   Standing Unsupported, Alternately Place Feet on Step/Stool  Able to complete 4 steps without aid or supervision    Standing Unsupported, One Foot in Front  Able to plae foot ahead of the other independently and hold 30 seconds    Standing on One Leg  Able to lift leg independently and hold equal to or more than 3 seconds    Total Score  46      Therapeutic Activites    Therapeutic Activities  Other Therapeutic Activities    Other Therapeutic Activities  pt able to engage in standing activity for 6 minutes before needed a rest break.       Knee/Hip Exercises: Aerobic   Other Aerobic  Scifit level 2.0 for 5 minutes no rest with goal rpm >/= 30 for strengthening and activity tolerance.             PT Short Term Goals - 12/27/19 0729      PT SHORT TERM GOAL #1   Title  Pt will be independent with progression of HEP to address balance, strength, and gait.  TARGET 3 weeks (12/31/2019)    Baseline  12/27/19: doing current program a couple days a week without issues per pt report. will need to be updated as pt progresses  Status  Achieved    Target Date  12/31/19      PT SHORT TERM GOAL #2   Title  Pt will improve 5x sit<>stand test to less than or equal to 11.5 seconds to demonstrate improved lower extremity functional strength.    Baseline  12/27/19: 10.68 sec's with intermittent UE support from  standard height chair    Time  --    Period  --    Status  Achieved    Target Date  12/31/19      PT SHORT TERM GOAL #3   Title  Pt will stand for at least 10 minutes during PT sessions, without sitting breaks, for improved participation in ADLs and household tasks.    Baseline  12/27/19: pt able to participate in standing gait/actvities for 6-7 minutes before needing a seated rest break, improved just not to goal    Time  --    Period  --    Status  Partially Met    Target Date  12/31/19        PT Long Term Goals - 12/27/19 1239      PT LONG TERM GOAL #1   Title  Pt will be independent with progression of HEP and verbalize plans for continued community fitness upon d/c from PT.  TARGET 01/28/2020    Baseline  12/27/19: has HEP that will need to be updated as pt continues to progress with therapy    Time  7    Period  Weeks    Status  On-going      PT LONG TERM GOAL #2   Title  Pt will improve Berg Balance score to at least 45/56 for decreased fall risk.    Baseline  12/27/19: 46/56 scored today    Status  Achieved      PT LONG TERM GOAL #3   Title  Pt will improve gait velocity to at least 2.62 ft/sec for improved gait efficiency and safety.    Baseline  12/27/19: 2.79 ft/sec with rollator    Status  Achieved      PT LONG TERM GOAL #4   Title  Pt will negotiate 1 curb step to simulate step up into home, no rail, with supervision, for improved safety with curbs/home entry.    Baseline  12/27/19: min guard assist with ramp, min assist with curb with rollator    Time  7    Period  Weeks    Status  On-going      PT LONG TERM GOAL #5   Title  Pt will negotiate at least 4 steps with one handrail with supervision, for improved safety with stair negotiation.    Baseline  12/27/19: min guard assist with single hand rail    Time  7    Period  Weeks    Status  On-going            Plan - 12/27/19 0729    Clinical Impression Statement  Today's skilled session focused on  progress toward goals with 1st (HEP) and 2cd goals (5x sit to stand) met, 3rd goal for standing 10 minutes not met. Pt has also met her LTGs for Berg Balance test and gait velocity. The pt is making steady progress and should benefit from continued PT to progress toward unmet goals.    Personal Factors and Comorbidities  Comorbidity 3+    Comorbidities  See PMH (subjective); metastatic breast cancer, hx of bipolar and anxiety    Examination-Activity Limitations  Stairs   Curbs   Examination-Participation Restrictions  --   Work   Stability/Clinical Decision Making  Stable/Uncomplicated    Rehab Potential  Good    PT Frequency  Other (comment)   1x/wk for 3 weeks, then 2x/wk for 4 weeks   PT Duration  --   7 weeks total POC   PT Treatment/Interventions  ADLs/Self Care Home Management;Neuromuscular re-education;Gait training;Stair training;Patient/family education;Therapeutic activities;Functional mobility training;Therapeutic exercise;Balance training;DME Instruction    PT Next Visit Plan  continue to work on gait/barriers with rollator, LE strengthening, balance reactions toward LTGs    Consulted and Agree with Plan of Care  Patient       Patient will benefit from skilled therapeutic intervention in order to improve the following deficits and impairments:  Abnormal gait, Difficulty walking, Decreased mobility, Decreased strength, Decreased balance  Visit Diagnosis: Muscle weakness (generalized)  Other abnormalities of gait and mobility  Unsteadiness on feet  Abnormal posture     Problem List Patient Active Problem List   Diagnosis Date Noted  . Brain metastases ([place]) 11/01/2019  . Thrombocytopenia ([place]) 08/09/2019  . Port-A-Cath in place 06/20/2019  . 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 S/P mastectomy, left 12/09/2018  . S/P breast reconstruction, left 12/09/2018  . S/P radiation therapy 12/09/2018  . GERD (gastroesophageal reflux  disease) 12/10/2017  . Edema 11/09/2017  . Sensorineural hearing loss (SNHL) of both ears 11/05/2017  . Vitamin D deficiency 01/18/2017  . Bipolar I disorder, most recent episode depressed ([place]) 12/08/2016  . Adjustment disorder with anxiety 12/06/2016  . History of cancer metastatic to brain 07/27/2016  . Iron deficiency anemia 06/26/2016  . Bone metastases ([place]) 06/03/2016  . Primary cancer of lower-inner quadrant of left female breast ([place]) 06/01/2016  . Malignant neoplasm of female breast ([place]) 07/01/2015  . Current smoker 03/28/2015  . Goals of care, counseling/discussion 03/28/2015  . Seasonal allergies 03/28/2015  . Anxiety state 02/28/2015  . Fibromyalgia 02/28/2015  . Family history of diabetes mellitus 02/28/2015  . H/O alcohol abuse     [place], [place], [facility] [address], [place], [place] [address] [phone] 12/27/19, 12:58 PM   Name: [name] MRN: [id number] Date of Birth: [birth year]   Updates to LTGs for submission to Medicaid-see below.   PT Long Term Goals - 12/27/19 1433      PT LONG TERM GOAL #1   Title  Pt will be independent with progression of HEP and verbalize plans for continued community fitness upon d/c from PT.  TARGET 01/28/2020    Baseline  12/27/19: has HEP that will need to be updated as pt continues to progress with therapy    Time  7    Period  Weeks    Status  On-going      PT LONG TERM GOAL #2   Title  Pt will improve Berg Balance score to at least 49/56 for decreased fall risk.    Baseline  12/27/19: 46/56 scored today    Time  7    Status  Revised      PT LONG TERM GOAL #3   Title  Pt will improve gait velocity to at least 3 ft/sec for improved gait efficiency and safety.    Baseline  12/27/19: 2.79 ft/sec with rollator    Time  7    Status  Revised      PT LONG TERM GOAL #4   Title  Pt will negotiate  1 curb step to simulate step up into home, no rail, with supervision, for improved safety  with curbs/home entry.    Baseline  12/27/19: min guard assist with ramp, min assist with curb with rollator    Time  7    Period  Weeks    Status  On-going      PT LONG TERM GOAL #5   Title  Pt will negotiate at least 4 steps with one handrail with supervision, for improved safety with stair negotiation.    Baseline  12/27/19: min guard assist with single hand rail    Time  7    Period  Weeks    Status  On-going       Mady Haagensen, [place] 12/27/19 2:36 PM Phone: [phone] Fax: [phone]

## 2019-12-28 ENCOUNTER — Telehealth: Payer: Self-pay | Admitting: *Deleted

## 2019-12-28 NOTE — Telephone Encounter (Signed)
MRI of brain under sedation has been rescheduled to next available on 01/11/2020.  This note will be sent to MD's for coordination of care with current appointments.

## 2020-01-08 ENCOUNTER — Other Ambulatory Visit (HOSPITAL_COMMUNITY)
Admission: RE | Admit: 2020-01-08 | Discharge: 2020-01-08 | Disposition: A | Discharge status: Home / Self Care | Payer: Medicaid Other | Source: Ambulatory Visit | Attending: Internal Medicine | Admitting: Internal Medicine

## 2020-01-08 ENCOUNTER — Ambulatory Visit: Payer: Medicaid Other | Admitting: Physical Therapy

## 2020-01-08 DIAGNOSIS — Z20822 Contact with and (suspected) exposure to covid-19: Secondary | ICD-10-CM | POA: Insufficient documentation

## 2020-01-08 DIAGNOSIS — Z01812 Encounter for preprocedural laboratory examination: Secondary | ICD-10-CM | POA: Diagnosis present

## 2020-01-08 LAB — SARS CORONAVIRUS 2 (TAT 6-24 HRS): SARS Coronavirus 2: NEGATIVE

## 2020-01-09 ENCOUNTER — Inpatient Hospital Stay: Payer: Medicaid Other

## 2020-01-09 ENCOUNTER — Other Ambulatory Visit: Payer: Self-pay

## 2020-01-09 ENCOUNTER — Inpatient Hospital Stay: Payer: Medicaid Other | Attending: Medical

## 2020-01-09 VITALS — BP 125/72 | HR 88 | Temp 97.8°F | Resp 16 | Ht 65.0 in | Wt 139.0 lb

## 2020-01-09 DIAGNOSIS — C50312 Malignant neoplasm of lower-inner quadrant of left female breast: Secondary | ICD-10-CM | POA: Insufficient documentation

## 2020-01-09 DIAGNOSIS — D696 Thrombocytopenia, unspecified: Secondary | ICD-10-CM

## 2020-01-09 DIAGNOSIS — Z95828 Presence of other vascular implants and grafts: Secondary | ICD-10-CM

## 2020-01-09 DIAGNOSIS — F411 Generalized anxiety disorder: Secondary | ICD-10-CM

## 2020-01-09 DIAGNOSIS — C50119 Malignant neoplasm of central portion of unspecified female breast: Secondary | ICD-10-CM

## 2020-01-09 DIAGNOSIS — Z5112 Encounter for antineoplastic immunotherapy: Secondary | ICD-10-CM | POA: Diagnosis not present

## 2020-01-09 DIAGNOSIS — C7951 Secondary malignant neoplasm of bone: Secondary | ICD-10-CM | POA: Insufficient documentation

## 2020-01-09 DIAGNOSIS — C7931 Secondary malignant neoplasm of brain: Secondary | ICD-10-CM | POA: Insufficient documentation

## 2020-01-09 DIAGNOSIS — C770 Secondary and unspecified malignant neoplasm of lymph nodes of head, face and neck: Secondary | ICD-10-CM

## 2020-01-09 LAB — COMPREHENSIVE METABOLIC PANEL
ALT: 27 U/L (ref 0–44)
AST: 41 U/L (ref 15–41)
Albumin: 3.8 g/dL (ref 3.5–5.0)
Alkaline Phosphatase: 77 U/L (ref 38–126)
Anion gap: 7 (ref 5–15)
BUN: 10 mg/dL (ref 6–20)
CO2: 28 mmol/L (ref 22–32)
Calcium: 8.8 mg/dL — ABNORMAL LOW (ref 8.9–10.3)
Chloride: 105 mmol/L (ref 98–111)
Creatinine, Ser: 0.83 mg/dL (ref 0.44–1.00)
GFR calc Af Amer: >60 mL/min (ref >60)
GFR calc non Af Amer: >60 mL/min (ref >60)
Glucose, Bld: 92 mg/dL (ref 70–99)
Potassium: 3.7 mmol/L (ref 3.5–5.1)
Sodium: 140 mmol/L (ref 135–145)
Total Bilirubin: 0.5 mg/dL (ref 0.3–1.2)
Total Protein: 6.7 g/dL (ref 6.5–8.1)

## 2020-01-09 LAB — CBC WITH DIFFERENTIAL/PLATELET
Abs Immature Granulocytes: 0 10*3/uL (ref 0.00–0.07)
Basophils Absolute: 0 10*3/uL (ref 0.0–0.1)
Basophils Relative: 1 %
Eosinophils Absolute: 0 10*3/uL (ref 0.0–0.5)
Eosinophils Relative: 0 %
HCT: 36.7 % (ref 36.0–46.0)
Hemoglobin: 11.8 g/dL — ABNORMAL LOW (ref 12.0–15.0)
Immature Granulocytes: 0 %
Lymphocytes Relative: 21 %
Lymphs Abs: 0.5 10*3/uL — ABNORMAL LOW (ref 0.7–4.0)
MCH: 28.8 pg (ref 26.0–34.0)
MCHC: 32.2 g/dL (ref 30.0–36.0)
MCV: 89.5 fL (ref 80.0–100.0)
Monocytes Absolute: 0.2 10*3/uL (ref 0.1–1.0)
Monocytes Relative: 9 %
Neutro Abs: 1.6 10*3/uL — ABNORMAL LOW (ref 1.7–7.7)
Neutrophils Relative %: 69 %
Platelets: 49 10*3/uL — ABNORMAL LOW (ref 150–400)
RBC: 4.1 MIL/uL (ref 3.87–5.11)
RDW: 15.9 % — ABNORMAL HIGH (ref 11.5–15.5)
WBC: 2.4 10*3/uL — ABNORMAL LOW (ref 4.0–10.5)
nRBC: 0 % (ref 0.0–0.2)

## 2020-01-09 MED ORDER — SODIUM CHLORIDE 0.9 % IV SOLN
Freq: Once | INTRAVENOUS | Status: AC
  Filled 2020-01-09: qty 250

## 2020-01-09 MED ORDER — DIPHENHYDRAMINE HCL 25 MG PO CAPS: 25.0000 mg | ORAL_CAPSULE | Freq: Once | ORAL | Status: DC

## 2020-01-09 MED ORDER — ACETAMINOPHEN 325 MG PO TABS
650.0000 mg | ORAL_TABLET | Freq: Once | ORAL | Status: AC
  Administered 2020-01-09: 650 mg via ORAL

## 2020-01-09 MED ORDER — SODIUM CHLORIDE 0.9% FLUSH
10.0000 mL | INTRAVENOUS | Status: DC | PRN
  Administered 2020-01-09: 10 mL
  Filled 2020-01-09: qty 10

## 2020-01-09 MED ORDER — TRASTUZUMAB-DKST CHEMO 150 MG IV SOLR
6.0000 mg/kg | Freq: Once | INTRAVENOUS | Status: AC
  Administered 2020-01-09: 399 mg via INTRAVENOUS
  Filled 2020-01-09: qty 19

## 2020-01-09 MED ORDER — DIPHENHYDRAMINE HCL 25 MG PO CAPS
ORAL_CAPSULE | ORAL | Status: AC
  Filled 2020-01-09: qty 1

## 2020-01-09 MED ORDER — ACETAMINOPHEN 325 MG PO TABS
ORAL_TABLET | ORAL | Status: AC
  Filled 2020-01-09: qty 2

## 2020-01-09 MED ORDER — HEPARIN SOD (PORK) LOCK FLUSH 100 UNIT/ML IV SOLN
500.0000 [IU] | Freq: Once | INTRAVENOUS | Status: AC | PRN
  Administered 2020-01-09: 500 [IU]
  Filled 2020-01-09: qty 5

## 2020-01-09 NOTE — Patient Instructions (Addendum)
[facility] Discharge Instructions for Patients Receiving Chemotherapy  Today you received the following Immunotherapy agent: [id number]  To help prevent nausea and vomiting after your treatment, we encourage you to take your nausea medication as directed by your MD.   If you develop nausea and vomiting that is not controlled by your nausea medication, call the clinic.   BELOW ARE SYMPTOMS THAT SHOULD BE REPORTED IMMEDIATELY:  *FEVER GREATER THAN 100.5 F  *CHILLS WITH OR WITHOUT FEVER  NAUSEA AND VOMITING THAT IS NOT CONTROLLED WITH YOUR NAUSEA MEDICATION  *UNUSUAL SHORTNESS OF BREATH  *UNUSUAL BRUISING OR BLEEDING  TENDERNESS IN MOUTH AND THROAT WITH OR WITHOUT PRESENCE OF ULCERS  *URINARY PROBLEMS  *BOWEL PROBLEMS  UNUSUAL RASH Items with * indicate a potential emergency and should be followed up as soon as possible.  Feel free to call the clinic should you have any questions or concerns. The clinic phone number is [phone].  Please show the [facility] at check-in to the Emergency Department and triage nurse.  Coronavirus (COVID-19) Are you at risk?  Are you at risk for the Coronavirus (COVID-19)?  To be considered HIGH RISK for Coronavirus (COVID-19), you have to meet the following criteria:  . Traveled to [place], [place], [place], [place] or [place]; or in the [place] to [place], [place], [place], or [place]; and have fever, cough, and shortness of breath within the last 2 weeks of travel OR . Been in close contact with a person diagnosed with COVID-19 within the last 2 weeks and have fever, cough, and shortness of breath . IF YOU DO NOT MEET THESE CRITERIA, YOU ARE CONSIDERED LOW RISK FOR COVID-19.  What to do if you are HIGH RISK for COVID-19?  [name] If you are having a medical emergency, call 911. . Seek medical care right away. Before you go to a doctor's office, urgent care or emergency department, call ahead and tell  them about your recent travel, contact with someone diagnosed with COVID-19, and your symptoms. You should receive instructions from your physician's office regarding next steps of care.  . When you arrive at healthcare provider, tell the healthcare staff immediately you have returned from visiting [place], [place], [place], [place] or [place]; or traveled in the [place] to [place], [place], [place], or [place]; in the last two weeks or you have been in close contact with a person diagnosed with COVID-19 in the last 2 weeks.   . Tell the health care staff about your symptoms: fever, cough and shortness of breath. . After you have been seen by a medical provider, you will be either: o Tested for (COVID-19) and discharged home on quarantine except to seek medical care if symptoms worsen, and asked to  - Stay home and avoid contact with others until you get your results (4-5 days)  - Avoid travel on public transportation if possible (such as bus, train, or airplane) or o Sent to the Emergency Department by EMS for evaluation, COVID-19 testing, and possible admission depending on your condition and test results.  What to do if you are LOW RISK for COVID-19?  Reduce your risk of any infection by using the same precautions used for avoiding the common cold or flu:  [name] Wash your hands often with soap and warm water for at least 20 seconds.  If soap and water are not readily available, use an alcohol-based hand sanitizer with at least 60% alcohol.  . If  coughing or sneezing, cover your mouth and nose by coughing or sneezing into the elbow areas of your shirt or coat, into a tissue or into your sleeve (not your hands). . Avoid shaking hands with others and consider head nods or verbal greetings only. . Avoid touching your eyes, nose, or mouth with unwashed hands.  . Avoid close contact with people who are sick. . Avoid places or events with large numbers of people in one location, like concerts or  sporting events. . Carefully consider travel plans you have or are making. . If you are planning any travel outside or inside the [place], visit the CDC's Travelers' Health webpage for the latest health notices. . If you have some symptoms but not all symptoms, continue to monitor at home and seek medical attention if your symptoms worsen. . If you are having a medical emergency, call 911.   [place] / e-Visit: [email or web address]         [facility]: [facility]: [id number]                   [facility]: [id number]    Thrombocytopenia Thrombocytopenia means that you have a low number of platelets in your blood. Platelets are tiny cells in the blood. When you bleed, they clump together at the cut or injury to stop the bleeding. This is called blood clotting. If you do not have enough platelets, it can cause bleeding problems. Some cases of this condition are mild while others are more severe. What are the causes? This condition may be caused by:  Your body not making enough platelets. This may be caused by: ? Your bone marrow not making blood cells (aplastic anemia). ? Cancer in the bone marrow. ? Certain medicines. ? Infection in the bone marrow. ? Drinking a lot of alcohol.  Your body destroying platelets too quickly. This may be caused by: ? Certain immune diseases. ? Certain medicines. ? Certain blood clotting disorders. ? Certain disorders that are passed from parent to child (inherited). ? Certain bleeding disorders. ? Pregnancy. ? Having a spleen that is larger than normal. What are the signs or symptoms?  Bleeding that is not normal.  Nosebleeds.  Heavy menstrual periods.  Blood in the pee (urine) or poop (stool).  A purple-like color to the skin (purpura).  Bruising.  A rash that looks like pinpoint,  purple-red spots (petechiae). How is this treated?  Treatment of another condition that is causing the low platelet count.  Medicines to help protect your platelets from being destroyed.  A replacement (transfusion) of platelets to stop or prevent bleeding.  Surgery to remove the spleen. Follow these instructions at home: Activity  Avoid activities that could cause you to get hurt or bruised. Follow instructions about how to prevent falls.  Take care not to cut yourself: ? When you shave. ? When you use scissors, needles, knives, or other tools.  Take care not to burn yourself: ? When you use an iron. ? When you cook. General instructions   Check your skin and the inside of your mouth for bruises or blood as told by your doctor.  Check to see if there is blood in your spit (sputum), pee, and poop. Do this as told by your doctor.  Do not drink alcohol.  Take over-the-counter and prescription medicines only as told by your doctor.  Do not take any medicines that  have aspirin or NSAIDs in them. These medicines can thin your blood and cause you to bleed.  Tell all of your doctors that you have this condition. Be sure to tell your dentist and eye doctor too. Contact a doctor if:  You have bruises and you do not know why. Get help right away if:  You are bleeding anywhere on your body.  You have blood in your spit, pee, or poop. Summary  Thrombocytopenia means that you have a low number of platelets in your blood.  Platelets are needed for blood clotting.  Symptoms of this condition include bleeding that is not normal, and bruising.  Take care not to cut or burn yourself. This information is not intended to replace advice given to you by your health care provider. Make sure you discuss any questions you have with your health care provider. Document Revised: 08/18/2018 Document Reviewed: 08/18/2018 Elsevier Patient Education  [employer].

## 2020-01-09 NOTE — Progress Notes (Signed)
Per Dr. Ron Agee, ok for treatment with Platlets 49.

## 2020-01-10 ENCOUNTER — Encounter (HOSPITAL_COMMUNITY): Payer: Self-pay | Admitting: *Deleted

## 2020-01-10 ENCOUNTER — Other Ambulatory Visit: Payer: Self-pay

## 2020-01-10 ENCOUNTER — Ambulatory Visit: Payer: Medicaid Other | Admitting: Physical Therapy

## 2020-01-10 LAB — CANCER ANTIGEN 27.29: CA 27.29: 6.3 U/mL (ref 0.0–38.6)

## 2020-01-10 NOTE — Anesthesia Preprocedure Evaluation (Addendum)
Anesthesia Evaluation  Patient identified by MRN, date of birth, ID band Patient awake    Reviewed: Allergy & Precautions, NPO status , Patient's Chart, lab work & pertinent test results  History of Anesthesia Complications (+) PONV and history of anesthetic complications  Airway Mallampati: II  TM Distance: >3 FB Neck ROM: Full  Mouth opening: Limited Mouth Opening  Dental no notable dental hx.    Pulmonary Current Smoker and Patient abstained from smoking.,    Pulmonary exam normal        Cardiovascular negative cardio ROS Normal cardiovascular exam     Neuro/Psych  Headaches, Anxiety Depression Bipolar Disorder    GI/Hepatic Neg liver ROS, GERD  Medicated and Controlled,  Endo/Other  negative endocrine ROS  Renal/GU negative Renal ROS  negative genitourinary   Musculoskeletal  (+) Arthritis , Fibromyalgia -  Abdominal   Peds  Hematology  (+) anemia , Hgb 11.8, plt 49k   Anesthesia Other Findings Hx of breast ca with intracranial metastasis  Reproductive/Obstetrics negative OB ROS                           Anesthesia Physical Anesthesia Plan  ASA: III  Anesthesia Plan: General   Post-op Pain Management:    Induction: Intravenous  PONV Risk Score and Plan: 4 or greater and Treatment may vary due to age or medical condition, Ondansetron, Dexamethasone and Midazolam  Airway Management Planned: Oral ETT  Additional Equipment: None  Intra-op Plan:   Post-operative Plan: Extubation in OR  Informed Consent: I have reviewed the patients History and Physical, chart, labs and discussed the procedure including the risks, benefits and alternatives for the proposed anesthesia with the patient or authorized representative who has indicated his/her understanding and acceptance.     Dental advisory given  Plan Discussed with:   Anesthesia Plan Comments: (See recent PAT note by Karoline Caldwell, PA-C 12/25/19. MRI was cancelled at that time due to lack of transportation. No interim health changes noted. )      Anesthesia Quick Evaluation

## 2020-01-10 NOTE — Progress Notes (Signed)
Patient denies shortness of breath, fever, cough and chest pain.  PCP - Dr Charlott Rakes Cardiologist - Dr Haroldine Laws  Chest x-ray - denies EKG - denies Stress Test - denies ECHO - 12/06/19 Cardiac Cath - denies  Anesthesia review: Yes  STOP now taking any Aspirin (unless otherwise instructed by your surgeon), Aleve, Naproxen, Ibuprofen, Motrin, Advil, Goody's, BC's, all herbal medications, fish oil, and all vitamins.   Coronavirus Screening Covid testing on 01/08/20 was negative.  Patient verbalized understanding of instructions that were given via phone.

## 2020-01-11 ENCOUNTER — Ambulatory Visit (HOSPITAL_COMMUNITY)
Admission: RE | Admit: 2020-01-11 | Discharge: 2020-01-11 | Disposition: A | Discharge status: Home / Self Care | Payer: Medicaid Other | Source: Ambulatory Visit | Attending: Internal Medicine | Admitting: Internal Medicine

## 2020-01-11 ENCOUNTER — Encounter (HOSPITAL_COMMUNITY): Payer: Self-pay | Admitting: Internal Medicine

## 2020-01-11 ENCOUNTER — Ambulatory Visit (HOSPITAL_COMMUNITY): Payer: Medicaid Other | Admitting: Physician Assistant

## 2020-01-11 ENCOUNTER — Ambulatory Visit (HOSPITAL_COMMUNITY)
Admission: RE | Admit: 2020-01-11 | Discharge: 2020-01-11 | Disposition: A | Discharge status: Home / Self Care | Payer: Medicaid Other | Attending: Internal Medicine | Admitting: Internal Medicine

## 2020-01-11 ENCOUNTER — Other Ambulatory Visit: Payer: Self-pay

## 2020-01-11 ENCOUNTER — Telehealth: Payer: Self-pay | Admitting: *Deleted

## 2020-01-11 ENCOUNTER — Encounter (HOSPITAL_COMMUNITY)
Admission: RE | Disposition: A | Discharge status: Home / Self Care | Payer: Self-pay | Source: Home / Self Care | Attending: Internal Medicine

## 2020-01-11 DIAGNOSIS — M797 Fibromyalgia: Secondary | ICD-10-CM | POA: Insufficient documentation

## 2020-01-11 DIAGNOSIS — F319 Bipolar disorder, unspecified: Secondary | ICD-10-CM | POA: Diagnosis not present

## 2020-01-11 DIAGNOSIS — I6200 Nontraumatic subdural hemorrhage, unspecified: Secondary | ICD-10-CM | POA: Insufficient documentation

## 2020-01-11 DIAGNOSIS — C7931 Secondary malignant neoplasm of brain: Secondary | ICD-10-CM | POA: Insufficient documentation

## 2020-01-11 DIAGNOSIS — F17210 Nicotine dependence, cigarettes, uncomplicated: Secondary | ICD-10-CM | POA: Diagnosis not present

## 2020-01-11 DIAGNOSIS — K219 Gastro-esophageal reflux disease without esophagitis: Secondary | ICD-10-CM | POA: Insufficient documentation

## 2020-01-11 DIAGNOSIS — Z853 Personal history of malignant neoplasm of breast: Secondary | ICD-10-CM | POA: Diagnosis not present

## 2020-01-11 DIAGNOSIS — F419 Anxiety disorder, unspecified: Secondary | ICD-10-CM | POA: Diagnosis not present

## 2020-01-11 HISTORY — PX: RADIOLOGY WITH ANESTHESIA: SHX6223

## 2020-01-11 SURGERY — MRI WITH ANESTHESIA
Anesthesia: General

## 2020-01-11 MED ORDER — ONDANSETRON HCL 4 MG/2ML IJ SOLN
INTRAMUSCULAR | Status: DC | PRN
  Administered 2020-01-11: 4 mg via INTRAVENOUS

## 2020-01-11 MED ORDER — SUGAMMADEX SODIUM 200 MG/2ML IV SOLN
INTRAVENOUS | Status: DC | PRN
  Administered 2020-01-11: 120 mg via INTRAVENOUS

## 2020-01-11 MED ORDER — DEXAMETHASONE SODIUM PHOSPHATE 10 MG/ML IJ SOLN
INTRAMUSCULAR | Status: DC | PRN
  Administered 2020-01-11: 5 mg via INTRAVENOUS

## 2020-01-11 MED ORDER — ROCURONIUM BROMIDE 10 MG/ML (PF) SYRINGE
PREFILLED_SYRINGE | INTRAVENOUS | Status: DC | PRN
  Administered 2020-01-11: 60 mg via INTRAVENOUS

## 2020-01-11 MED ORDER — LACTATED RINGERS IV SOLN: INTRAVENOUS | Status: DC

## 2020-01-11 MED ORDER — GADOBUTROL 1 MMOL/ML IV SOLN
6.0000 mL | Freq: Once | INTRAVENOUS | Status: AC | PRN
  Administered 2020-01-11: 6 mL via INTRAVENOUS

## 2020-01-11 MED ORDER — FENTANYL CITRATE (PF) 100 MCG/2ML IJ SOLN
INTRAMUSCULAR | Status: DC | PRN
  Administered 2020-01-11: 50 ug via INTRAVENOUS

## 2020-01-11 MED ORDER — PROPOFOL 10 MG/ML IV BOLUS
INTRAVENOUS | Status: DC | PRN
  Administered 2020-01-11: 30 mg via INTRAVENOUS
  Administered 2020-01-11: 20 mg via INTRAVENOUS
  Administered 2020-01-11: 150 mg via INTRAVENOUS

## 2020-01-11 MED ORDER — MIDAZOLAM HCL 5 MG/5ML IJ SOLN
INTRAMUSCULAR | Status: DC | PRN
  Administered 2020-01-11: 1 mg via INTRAVENOUS

## 2020-01-11 MED ORDER — LIDOCAINE 2% (20 MG/ML) 5 ML SYRINGE
INTRAMUSCULAR | Status: DC | PRN
  Administered 2020-01-11: 60 mg via INTRAVENOUS

## 2020-01-11 NOTE — Anesthesia Procedure Notes (Signed)
Procedure Name: Intubation Date/Time: 01/11/2020 8:42 AM Performed by: Imagene Riches, CRNA Pre-anesthesia Checklist: Patient identified, Emergency Drugs available, Suction available and Patient being monitored Patient Re-evaluated:Patient Re-evaluated prior to induction Oxygen Delivery Method: Circle System Utilized Preoxygenation: Pre-oxygenation with 100% oxygen Induction Type: IV induction Ventilation: Mask ventilation without difficulty and Oral airway inserted - appropriate to patient size Laryngoscope Size: Sabra Heck and 2 Grade View: Grade I Tube type: Oral Tube size: 6.5 mm Number of attempts: 2 Airway Equipment and Method: Stylet and Oral airway Placement Confirmation: ETT inserted through vocal cords under direct vision,  positive ETCO2 and breath sounds checked- equal and bilateral Secured at: 22 cm Tube secured with: Tape Dental Injury: Teeth and Oropharynx as per pre-operative assessment  Comments: On first DL, unable to pass 7.0 ETT through vocal cords. With second DL, 6.5 ETT easily passed.

## 2020-01-11 NOTE — Transfer of Care (Signed)
Immediate Anesthesia Transfer of Care Note  Patient: [name]  Procedure(s) Performed: MRI WITH ANESTHESIA BRAIN WITH AND WITHOUT (N/A )  Patient Location: PACU  Anesthesia Type:General  Level of Consciousness: awake, alert  and oriented  Airway & Oxygen Therapy: Patient Spontanous Breathing and Patient connected to nasal cannula oxygen  Post-op Assessment: Report given to RN and Post -op Vital signs reviewed and stable  Post vital signs: Reviewed and stable  Last Vitals:  Vitals Value Taken Time  BP 120/72 01/11/20 0952  Temp    Pulse 81 01/11/20 0954  Resp 13 01/11/20 0954  SpO2 99 % 01/11/20 0954  Vitals shown include unvalidated device data.  Last Pain:  Vitals:   01/11/20 0710  TempSrc:   PainSc: 0-No pain         Complications: No apparent anesthesia complications

## 2020-01-11 NOTE — Anesthesia Postprocedure Evaluation (Signed)
Anesthesia Post Note  Patient: [name]  Procedure(s) Performed: MRI WITH ANESTHESIA BRAIN WITH AND WITHOUT (N/A )     Patient location during evaluation: PACU Anesthesia Type: General Level of consciousness: awake and alert and oriented Pain management: pain level controlled Vital Signs Assessment: post-procedure vital signs reviewed and stable Respiratory status: spontaneous breathing, nonlabored ventilation and respiratory function stable Cardiovascular status: blood pressure returned to baseline Postop Assessment: no apparent nausea or vomiting Anesthetic complications: no    Last Vitals:  Vitals:   01/11/20 0952 01/11/20 1003  BP:  126/75  Pulse:  82  Resp:  16  Temp: 36.9 C 36.8 C  SpO2: 96% 96%    Last Pain:  Vitals:   01/11/20 0952  TempSrc:   PainSc: 0-No pain                 Brennan Bailey

## 2020-01-11 NOTE — Telephone Encounter (Signed)
Received call report from [facility] Radiology regarding MRI completed today 01/11/2020.  Dr. Mickeal Skinner reviewed.  Appt scheduled for 01/15/2020.  Medications reviewed and also short stay nurse advised patient to not take any NSAIDS, Aspirin, herbals and vitamins until instructed otherwise.  No further instructions given at this point.

## 2020-01-12 ENCOUNTER — Ambulatory Visit: Payer: Medicaid Other | Admitting: Physical Therapy

## 2020-01-12 ENCOUNTER — Other Ambulatory Visit: Payer: Self-pay | Admitting: Family Medicine

## 2020-01-15 ENCOUNTER — Inpatient Hospital Stay (HOSPITAL_BASED_OUTPATIENT_CLINIC_OR_DEPARTMENT_OTHER): Payer: Medicaid Other | Admitting: Internal Medicine

## 2020-01-15 DIAGNOSIS — C7931 Secondary malignant neoplasm of brain: Secondary | ICD-10-CM

## 2020-01-15 NOTE — Progress Notes (Signed)
I connected with [name] on 01/15/20 at  9:00 AM EST by telephone visit and verified that I am speaking with the correct person using two identifiers.  I discussed the limitations, risks, security and privacy concerns of performing an evaluation and management service by telemedicine and the availability of in-person appointments. I also discussed with the patient that there may be a patient responsible charge related to this service. The patient expressed understanding and agreed to proceed.  Other persons participating in the visit and their role in the encounter:  n/a  Patient's location:  Home  Provider's location:  Office  Chief Complaint:  Brain Metastases  History of Present Ilness: No new or progressive neurologic deficits.  Headaches are sporadic and stable.  Denies seizures.  She does describe one provoked fall with head trauma, soft tissue injury, roughly 1 month ago.  No change in neurologic status apart from superficial injury.  Observations: Cognition and language normal  Imaging:  [facility] Clinician Interpretation: I have personally reviewed the CNS images as listed.  My interpretation, in the context of the patient's clinical presentation, is subdural hematoma  MR Brain W Wo Contrast  Result Date: 01/11/2020 CLINICAL DATA:  Brain metastasis. Neoplasm: Head, CNS, Rx monitor or follow-up. EXAM: MRI HEAD WITHOUT AND WITH CONTRAST TECHNIQUE: Multiplanar, multiecho pulse sequences of the brain and surrounding structures were obtained without and with intravenous contrast. CONTRAST:  63mL GADAVIST GADOBUTROL 1 MMOL/ML IV SOLN COMPARISON:  Prior brain MRI examinations 07/06/2019 and earlier FINDINGS: Brain: New from prior MRI 07/06/2019, there is a right cerebral convexity mixed signal intensity subdural collection consistent with hematoma. The hematoma measures up to 8 mm in thickness overlying the right frontoparietal lobes. There is patchy diffusion weighted hyperintensity within the  collection which may reflect susceptibility from blood products. Mild mass effect upon the underlying right cerebral hemisphere. No midline shift. There is no evidence of acute infarct. No evidence of intracranial mass. Redemonstrated multifocal SWI signal loss within the bilateral cerebral hemispheres as well as confluent T2/FLAIR hyperintense cerebral white matter signal abnormality. Findings are consistent with the history of prior whole brain radiation. Mild-to-moderate generalized parenchymal atrophy. Redemonstrated sequela of previous left suboccipital craniotomy with small focus of encephalomalacia in the left cerebellar hemisphere. There is smooth dural thickening and enhancement along the right subdural collection. No nodular or masslike dural enhancement is demonstrated. No abnormal parenchymal enhancement is demonstrated to suggest intracranial metastatic disease. Vascular: Flow voids maintained within the proximal large arterial vessels. Expected enhancement within the proximal large arterial vessels and dural venous sinuses. Skull and upper cervical spine: Left suboccipital cranioplasty. No focal marrow lesion Sinuses/Orbits: Visualized orbits demonstrate no acute abnormality. Mild ethmoid sinus mucosal thickening. Trace fluid within right mastoid air cells. Impression #1 will be called to the ordering clinician or representative by the Radiologist Assistant, and communication documented in the PACS or zVision Dashboard. IMPRESSION: Right cerebral convexity mixed signal intensity subdural hematoma measuring up to 8 mm in thickness, new from prior MRI 07/06/2019. There is associated smooth dural thickening and enhancement, likely reactive. No masslike or nodular dural thickening is demonstrated. Stable post treatment changes without evidence of recurrent metastatic disease. Electronically Signed   By: Kellie Simmering DO   On: 01/11/2020 12:56    Assessment and Plan: Clinically stable.  CT head  demonstrates subdural hematoma likely 2/2 recent head trauma, thrombocytopenia, prior irradiation.   Follow Up Instructions: Follow up via telephone after repeat CT head in 4-6 weeks to re-assess blood products.  Next MRI can be in 6 months.  I discussed the assessment and treatment plan with the patient.  The patient was provided an opportunity to ask questions and all were answered.  The patient agreed with the plan and demonstrated understanding of the instructions.    The patient was advised to call back or seek an in-person evaluation if the symptoms worsen or if the condition fails to improve as anticipated.  I provided 5-10 minutes of non-face-to-face time during this enocunter.  Ventura Sellers, MD   I provided 20 minutes of non face-to-face telephone visit time during this encounter, and > 50% was spent counseling as documented under my assessment & plan.

## 2020-01-16 ENCOUNTER — Other Ambulatory Visit: Payer: Self-pay

## 2020-01-16 ENCOUNTER — Ambulatory Visit: Payer: Medicaid Other | Attending: Adult Health | Admitting: Physical Therapy

## 2020-01-16 ENCOUNTER — Encounter: Payer: Self-pay | Admitting: Physical Therapy

## 2020-01-16 ENCOUNTER — Telehealth: Payer: Self-pay | Admitting: Internal Medicine

## 2020-01-16 DIAGNOSIS — R293 Abnormal posture: Secondary | ICD-10-CM | POA: Diagnosis present

## 2020-01-16 DIAGNOSIS — R2681 Unsteadiness on feet: Secondary | ICD-10-CM

## 2020-01-16 DIAGNOSIS — R2689 Other abnormalities of gait and mobility: Secondary | ICD-10-CM | POA: Diagnosis present

## 2020-01-16 DIAGNOSIS — M6281 Muscle weakness (generalized): Secondary | ICD-10-CM | POA: Diagnosis present

## 2020-01-16 NOTE — Therapy (Signed)
[employer] [address] [place], [place], [address] Phone: [phone]   Fax:  [phone]  Physical Therapy Treatment  Patient Details  Name: [name] MRN: [id number] Date of Birth: [birth year] Referring Provider (PT): Wilber Bihari, NP   Encounter Date: 01/16/2020  PT End of Session - 01/16/20 0939    Visit Number  16    Number of Visits  23   per recert [id number]/65/0354   Date for PT Re-Evaluation  02/12/20    Authorization Type  Medicaid (3 visits 12/07/2019-12/27/2019)    Authorization Time Period  2021 visits: 3 visits 12/07/19-12/27/19, 8 visits from 01/08/20- 02/04/20    Authorization - Visit Number  1    Authorization - Number of Visits  8    PT Start Time  0930    PT Stop Time  1010    PT Time Calculation (min)  40 min    Equipment Utilized During Treatment  Gait belt    Activity Tolerance  Patient tolerated treatment well;No increased pain;Patient limited by fatigue   seated rest breaks needed through out session   Behavior During Therapy  [facility] for tasks assessed/performed   continues with decreased safety awareness      Past Medical History:  Diagnosis Date  . Alcohol abuse    none since 2013  . Anemia    during chemo  . Anxiety    At age 48  . Arthritis Dx 2010  . Bipolar disorder ([place])   . Bronchitis   . Cancer ([place])    breast mets to brain  . Chronic pain    resolved per patient 12/25/19  . Depression   . Family history of adverse reaction to anesthesia    MOther had PONV  . Fibromyalgia Dx 2005  . GERD (gastroesophageal reflux disease)   . Headache    hx  migraines  . Lymphedema of left arm   . Opiate dependence ([place])   . PONV (postoperative nausea and vomiting)   . Port-A-Cath in place   . PTSD (post-traumatic stress disorder)   . S/P endometrial ablation    in md's office    Past Surgical History:  Procedure Laterality Date  . APPLICATION OF CRANIAL NAVIGATION N/A 08/14/2016   Procedure: APPLICATION OF CRANIAL NAVIGATION;  Surgeon: Erline Levine, MD;  Location: [facility];  Service: Neurosurgery;  Laterality: N/A;  . BREAST RECONSTRUCTION Left    with silicone implant  . COLONOSCOPY W/ POLYPECTOMY    . CRANIOTOMY N/A 08/14/2016   Procedure: CRANIOTOMY TUMOR EXCISION WITH Lucky Rathke;  Surgeon: Erline Levine, MD;  Location: [facility];  Service: Neurosurgery;  Laterality: N/A;  . FIBULA FRACTURE SURGERY Left   . MASTECTOMY Left   . RADIOLOGY WITH ANESTHESIA N/A 07/23/2016   Procedure: MRI OF BRAIN WITH AND WITHOUT;  Surgeon: Medication Radiologist, MD;  Location: [facility];  Service: Radiology;  Laterality: N/A;  . RADIOLOGY WITH ANESTHESIA N/A 09/08/2016   Procedure: MRI OF BRAIN WITH AND WITHOUT CONTRAST;  Surgeon: Medication Radiologist, MD;  Location: [facility];  Service: Radiology;  Laterality: N/A;  . RADIOLOGY WITH ANESTHESIA N/A 12/10/2016   Procedure: MRI OF BRAIN WITH AND WITHOUT;  Surgeon: Medication Radiologist, MD;  Location: [facility];  Service: Radiology;  Laterality: N/A;  . RADIOLOGY WITH ANESTHESIA N/A 03/02/2017   Procedure: MRI of BRAIN W and W/OUT CONTRAST;  Surgeon: Medication Radiologist, MD;  Location: [facility];  Service: Radiology;  Laterality: N/A;  . RADIOLOGY WITH ANESTHESIA N/A  07/29/2017   Procedure: RADIOLOGY WITH ANESTHESIA MRI OF BRAIN WITH AND WITHOUT CONTRAST;  Surgeon: Radiologist, Medication, MD;  Location: [facility];  Service: Radiology;  Laterality: N/A;  . RADIOLOGY WITH ANESTHESIA N/A 12/07/2017   Procedure: MRI WITH ANESTHESIA OF BRAIN WITH AND WITHOUT CONTRAST;  Surgeon: Radiologist, Medication, MD;  Location: [facility];  Service: Radiology;  Laterality: N/A;  . RADIOLOGY WITH ANESTHESIA N/A 04/07/2018   Procedure: MRI OF BRAIN WITH AND WITHOUT CONTRAST;  Surgeon: Radiologist, Medication, MD;  Location: [facility];  Service: Radiology;  Laterality: N/A;  . RADIOLOGY WITH ANESTHESIA N/A 08/23/2018   Procedure: MRI WITH ANESTHESIA OF THE BRAIN WITH AND WITHOUT;   Surgeon: Radiologist, Medication, MD;  Location: [facility];  Service: Radiology;  Laterality: N/A;  . RADIOLOGY WITH ANESTHESIA N/A 01/24/2019   Procedure: MRI OF BRAIN WITH AND WITHOUT CONTRAST;  Surgeon: Radiologist, Medication, MD;  Location: [facility];  Service: Radiology;  Laterality: N/A;  . RADIOLOGY WITH ANESTHESIA N/A 07/06/2019   Procedure: MRI WITH ANESTHESIA OF BRAIN WITH AND WITHOUT CONTRAST;  Surgeon: Radiologist, Medication, MD;  Location: [facility];  Service: Radiology;  Laterality: N/A;  . RADIOLOGY WITH ANESTHESIA N/A 01/11/2020   Procedure: MRI WITH ANESTHESIA BRAIN WITH AND WITHOUT;  Surgeon: Radiologist, Medication, MD;  Location: [facility];  Service: Radiology;  Laterality: N/A;  . right power port placement Right     There were no vitals filed for this visit.  Subjective Assessment - 01/16/20 0936    Subjective  No new complaints. Has been moving, new place is level entry apartment. No new falls or pain to report. Did had CT recently that shows a SDH. Dr. Mickeal Skinner attributed this to recent head trauma for fall and plans for repeat CT in 4-6 weeks. To clinic today without AD. "I keep meaning to get it when I go to the old place".    Pertinent History  Per [employer] lymphedema eval 07/2019:  Treated here in March of last year currently with a reid sleeve, day sleeve and compression pump. Lt mastectomy and ALND with reconstruction 07/08/15 for T4N2 stage IIIB IDC. Metastatic disease in the brain s/p craniotomy 08/14/16. Chemo completed in 2017 Trastuzumab and Pertuzumab, docetaxel. Radiation completed 2017. Stable bone lesions to the Rt scapula, Lt iliac crest, L4, Tspine. Also probable in the liver and lungs. Pt is also bipolar and a recovered drug and alchohol user.  Current treatment TDM-1, denosumab and anastrozole.    Limitations  Other (comment)    Patient Stated Goals  To get more stability back in legs    Currently in Pain?  No/denies    Pain Score  0-No pain            [facility] Adult PT  Treatment/Exercise - 01/16/20 0941      Transfers   Transfers  Sit to Stand;Stand to Sit    Sit to Stand  5: Supervision;Without upper extremity assist;From bed;From chair/3-in-1;With upper extremity assist    Stand to Sit  5: Supervision;With upper extremity assist;To chair/3-in-1;To bed;Without upper extremity assist      Ambulation/Gait   Ambulation/Gait  Yes    Ambulation/Gait Assistance  4: Min guard;5: Supervision    Ambulation/Gait Assistance Details  around gym with session. min guard assist when pt fatigued for safety.     Assistive device  None    Gait Pattern  Step-through pattern;Decreased stride length;Shuffle;Narrow base of support;Poor foot clearance - left;Poor foot clearance - right    Ambulation Surface  Level;Indoor  Self-Care   Self-Care  Other Self-Care Comments    Other Self-Care Comments   in light of recent CT results revisited fall prevention with focus on use of rollator at all times as pt's most recent scores on balance tests continue to place her in a high fall risk category. The pt verbalized understanding and plans to get her rollator today.       Exercises   Exercises  Other Exercises    Other Exercises   seated with feet on 2 one inch mats: sit<>stands for 10 reps with cues/emphasis on tall posture, slow and controlled descent.       Knee/Hip Exercises: Aerobic   Nustep  Level 2.0 with UE/LE's for 4 minutes work, 2 minutes rest, then 4 more minutes of work, >/= 40 steps per minute.           Balance Exercises - 01/16/20 0959      Balance Exercises: Standing   Standing Eyes Closed  Narrow base of support (BOS);Foam/compliant surface;Other reps (comment);Wide (BOA);Head turns;30 secs;Limitations    Standing Eyes Closed Limitations  on 2 one inch foam mats with light fingertip support on sturdy surface: feet together for EC no head movements, progressing to feet apart for EC head movements left<>right, up<>down for 10 reps each. min guard to min  assist.           PT Short Term Goals - 12/27/19 0729      PT SHORT TERM GOAL #1   Title  Pt will be independent with progression of HEP to address balance, strength, and gait.  TARGET 3 weeks (12/31/2019)    Baseline  12/27/19: doing current program a couple days a week without issues per pt report. will need to be updated as pt progresses    Status  Achieved    Target Date  12/31/19      PT SHORT TERM GOAL #2   Title  Pt will improve 5x sit<>stand test to less than or equal to 11.5 seconds to demonstrate improved lower extremity functional strength.    Baseline  12/27/19: 10.68 sec's with intermittent UE support from standard height chair    Time  --    Period  --    Status  Achieved    Target Date  12/31/19      PT SHORT TERM GOAL #3   Title  Pt will stand for at least 10 minutes during PT sessions, without sitting breaks, for improved participation in ADLs and household tasks.    Baseline  12/27/19: pt able to participate in standing gait/actvities for 6-7 minutes before needing a seated rest break, improved just not to goal    Time  --    Period  --    Status  Partially Met    Target Date  12/31/19        PT Long Term Goals - 12/27/19 1433      PT LONG TERM GOAL #1   Title  Pt will be independent with progression of HEP and verbalize plans for continued community fitness upon d/c from PT.  TARGET 01/28/2020    Baseline  12/27/19: has HEP that will need to be updated as pt continues to progress with therapy    Time  7    Period  Weeks    Status  On-going      PT LONG TERM GOAL #2   Title  Pt will improve Berg Balance score to at least 49/56 for decreased fall risk.  Baseline  12/27/19: 46/56 scored today    Time  7    Status  Revised      PT LONG TERM GOAL #3   Title  Pt will improve gait velocity to at least 3 ft/sec for improved gait efficiency and safety.    Baseline  12/27/19: 2.79 ft/sec with rollator    Time  7    Status  Revised      PT LONG TERM GOAL #4    Title  Pt will negotiate 1 curb step to simulate step up into home, no rail, with supervision, for improved safety with curbs/home entry.    Baseline  12/27/19: min guard assist with ramp, min assist with curb with rollator    Time  7    Period  Weeks    Status  On-going      PT LONG TERM GOAL #5   Title  Pt will negotiate at least 4 steps with one handrail with supervision, for improved safety with stair negotiation.    Baseline  12/27/19: min guard assist with single hand rail    Time  7    Period  Weeks    Status  On-going            Plan - 01/16/20 0940    Clinical Impression Statement  Today's skilled session continued to focus on strengthening and balance reactions. Frequent rest breaks needed due to fatigue this session.The pt should benefit from continued PT to progress toward unmet goals.    Personal Factors and Comorbidities  Comorbidity 3+    Comorbidities  See PMH (subjective); metastatic breast cancer, hx of bipolar and anxiety    Examination-Activity Limitations  Stairs   Curbs   Examination-Participation Restrictions  --   Work   Stability/Clinical Decision Making  Stable/Uncomplicated    Rehab Potential  Good    PT Frequency  Other (comment)   1x/wk for 3 weeks, then 2x/wk for 4 weeks   PT Duration  --   7 weeks total POC   PT Treatment/Interventions  ADLs/Self Care Home Management;Neuromuscular re-education;Gait training;Stair training;Patient/family education;Therapeutic activities;Functional mobility training;Therapeutic exercise;Balance training;DME Instruction    PT Next Visit Plan  continue to work on gait/barriers with rollator, LE strengthening, balance reactions toward LTGs    Consulted and Agree with Plan of Care  Patient       Patient will benefit from skilled therapeutic intervention in order to improve the following deficits and impairments:  Abnormal gait, Difficulty walking, Decreased mobility, Decreased strength, Decreased balance  Visit  Diagnosis: Muscle weakness (generalized)  Other abnormalities of gait and mobility  Unsteadiness on feet  Abnormal posture     Problem List Patient Active Problem List   Diagnosis Date Noted  . Brain metastases ([place]) 11/01/2019  . Thrombocytopenia ([place]) 08/09/2019  . Port-A-Cath in place 06/20/2019  . 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 S/P mastectomy, left 12/09/2018  . S/P breast reconstruction, left 12/09/2018  . S/P radiation therapy 12/09/2018  . GERD (gastroesophageal reflux disease) 12/10/2017  . Edema 11/09/2017  . Sensorineural hearing loss (SNHL) of both ears 11/05/2017  . Vitamin D deficiency 01/18/2017  . Bipolar I disorder, most recent episode depressed ([place]) 12/08/2016  . Adjustment disorder with anxiety 12/06/2016  . History of cancer metastatic to brain 07/27/2016  . Iron deficiency anemia 06/26/2016  . Bone metastases ([place]) 06/03/2016  . Primary cancer of lower-inner quadrant of left female breast ([place]) 06/01/2016  . Malignant neoplasm of  female breast ([place]) 07/01/2015  . Current smoker 03/28/2015  . Goals of care, counseling/discussion 03/28/2015  . Seasonal allergies 03/28/2015  . Anxiety state 02/28/2015  . Fibromyalgia 02/28/2015  . Family history of diabetes mellitus 02/28/2015  . H/O alcohol abuse     Willow Ora 01/16/2020, 4:31 PM  [facility] [address] [place], [place], [address] Phone: [phone]   Fax:  [phone]  Name: [name] MRN: [id number] Date of Birth: December 19, 1960

## 2020-01-16 NOTE — Telephone Encounter (Signed)
Scheduled appt per 2/15 los.'  Spoke with pt and also left a VM of the appt date and time, per the patient request.

## 2020-01-17 ENCOUNTER — Other Ambulatory Visit: Payer: Self-pay | Admitting: Oncology

## 2020-01-18 ENCOUNTER — Ambulatory Visit: Payer: Medicaid Other | Admitting: Physical Therapy

## 2020-01-22 ENCOUNTER — Telehealth: Payer: Self-pay | Admitting: *Deleted

## 2020-01-22 NOTE — Telephone Encounter (Signed)
Patient called this morning to report fall this weekend with a hit to the back of her head.  She denies losing consciousness, headache, or loss of any other neurological function.  Reviewed with Dr. Mickeal Skinner.   Instructed patient to seek emergent medical care if she should have any sudden loss of neurological function.  No other instructions at this time.  Patient states understanding.

## 2020-01-23 ENCOUNTER — Ambulatory Visit: Payer: Medicaid Other | Admitting: Physical Therapy

## 2020-01-25 ENCOUNTER — Ambulatory Visit: Payer: Medicaid Other | Admitting: Physical Therapy

## 2020-01-28 ENCOUNTER — Other Ambulatory Visit: Payer: Self-pay | Admitting: Oncology

## 2020-01-29 ENCOUNTER — Telehealth: Payer: Self-pay

## 2020-01-29 NOTE — Telephone Encounter (Signed)
Pt called to report nose bleed X 1 this AM.     Pt with episode of epistaxis this AM, patient reports being able to stop the bleeding after several minutes.  Pt with history of thrombocytopenia.    Pt denies any abnormal bleeding in gums or stools.  RN educated on thrombocytopenia precautions.    Per MD recommendations patient to utilize normal saline nose spray.  If abnormal bruising or bleeding develops will recommend lab and further assessment.  Pt verbalized understanding.

## 2020-01-30 ENCOUNTER — Ambulatory Visit: Payer: Medicaid Other | Admitting: Physical Therapy

## 2020-01-31 ENCOUNTER — Ambulatory Visit (HOSPITAL_COMMUNITY)
Admission: RE | Admit: 2020-01-31 | Discharge: 2020-01-31 | Disposition: A | Discharge status: Home / Self Care | Payer: Medicaid Other | Source: Ambulatory Visit | Attending: Oncology | Admitting: Oncology

## 2020-01-31 ENCOUNTER — Other Ambulatory Visit: Payer: Self-pay | Admitting: Oncology

## 2020-01-31 ENCOUNTER — Encounter (HOSPITAL_COMMUNITY): Payer: Self-pay

## 2020-01-31 ENCOUNTER — Encounter: Payer: Self-pay | Admitting: Oncology

## 2020-01-31 ENCOUNTER — Ambulatory Visit (HOSPITAL_BASED_OUTPATIENT_CLINIC_OR_DEPARTMENT_OTHER): Payer: Medicaid Other | Admitting: Family Medicine

## 2020-01-31 ENCOUNTER — Other Ambulatory Visit: Payer: Self-pay

## 2020-01-31 DIAGNOSIS — Z7189 Other specified counseling: Secondary | ICD-10-CM

## 2020-01-31 DIAGNOSIS — C770 Secondary and unspecified malignant neoplasm of lymph nodes of head, face and neck: Secondary | ICD-10-CM | POA: Insufficient documentation

## 2020-01-31 DIAGNOSIS — C50312 Malignant neoplasm of lower-inner quadrant of left female breast: Secondary | ICD-10-CM | POA: Insufficient documentation

## 2020-01-31 DIAGNOSIS — C7931 Secondary malignant neoplasm of brain: Secondary | ICD-10-CM | POA: Diagnosis not present

## 2020-01-31 DIAGNOSIS — C50919 Malignant neoplasm of unspecified site of unspecified female breast: Secondary | ICD-10-CM | POA: Diagnosis not present

## 2020-01-31 DIAGNOSIS — C7951 Secondary malignant neoplasm of bone: Secondary | ICD-10-CM | POA: Insufficient documentation

## 2020-01-31 DIAGNOSIS — J018 Other acute sinusitis: Secondary | ICD-10-CM | POA: Diagnosis not present

## 2020-01-31 DIAGNOSIS — W19XXXD Unspecified fall, subsequent encounter: Secondary | ICD-10-CM | POA: Diagnosis not present

## 2020-01-31 MED ORDER — TECHNETIUM TC 99M MEDRONATE IV KIT
21.3000 | PACK | Freq: Once | INTRAVENOUS | Status: AC | PRN
  Administered 2020-01-31: 21.3 via INTRAVENOUS

## 2020-01-31 MED ORDER — SODIUM CHLORIDE (PF) 0.9 % IJ SOLN
INTRAMUSCULAR | Status: AC
  Filled 2020-01-31: qty 50

## 2020-01-31 MED ORDER — AMOXICILLIN 500 MG PO CAPS: 500.0000 mg | ORAL_CAPSULE | Freq: Three times a day (TID) | ORAL | 0 refills | Status: DC

## 2020-01-31 MED ORDER — IOHEXOL 300 MG/ML  SOLN
75.0000 mL | Freq: Once | INTRAMUSCULAR | Status: AC | PRN
  Administered 2020-01-31: 75 mL via INTRAVENOUS

## 2020-01-31 NOTE — Progress Notes (Unsigned)
I called [facility] and let her know that the CT scan is suggestive of COVID-19.  She needs to get tested.  We will set that up for her.

## 2020-01-31 NOTE — Progress Notes (Signed)
Virtual Visit via Telephone Note  I connected with [name], on 01/31/2020 at 1:32 PM by telephone due to the COVID-19 pandemic and verified that I am speaking with the correct person using two identifiers.   Consent: I discussed the limitations, risks, security and privacy concerns of performing an evaluation and management service by telephone and the availability of in person appointments. I also discussed with the patient that there may be a patient responsible charge related to this service. The patient expressed understanding and agreed to proceed.   Location of Patient: Home  Location of Provider: Clinic   Persons participating in Telemedicine visit: Kalah Mansker Farrington-CMA Dr. 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   She had been in the hospital all day for appointments she says and during one of her appointments she had water run over her purse and got it wet; this has her in a not so good mood at this time.Complains her anxiety is high right now. Her balance is not great and she is expecting a rollator soon; last fall was 3 weeks ago.  Last PT session was on 01/16/2020.  Seen by oncology last month and thought to be clinically stable; Oncology appt comes up later this month - 02/06/20 She had a CT head today ordered by oncology which revealed: IMPRESSION: Interval decrease in size of a al mixed density subdural collection in the right cerebral convexity, measuring up to 6 mm in thickness. No midline shift.  Bone scan also ordered but reading is yet to be in  epic.  Her sinuses have been clogged up for 2 weeks, ears flutter and she has had Theraflu, OTC sinus remedies with no improvement; she also has sinus pressure. She has intermittent headache but no fever.  Past Medical History:  Diagnosis Date  . Alcohol abuse    none since 2013  . Anemia    during chemo  . Anxiety    At age 45  . Arthritis Dx 2010  . Bipolar disorder ([place])   . Bronchitis   . 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 Chronic pain    resolved per patient 12/25/19  . Depression   . Family history of adverse reaction to anesthesia    MOther had PONV  . Fibromyalgia Dx 2005  . GERD (gastroesophageal reflux disease)   . Headache    hx  migraines  . Lymphedema of left arm   . Opiate dependence ([place])   . PONV (postoperative nausea and vomiting)   . Port-A-Cath in place   . PTSD (post-traumatic stress disorder)   . S/P endometrial ablation    in md's office   Allergies  Allergen Reactions  . Demerol [Meperidine Hcl] Itching and Nausea And Vomiting    INTOLERANCE >  N & V  . Erythromycin Rash    Current Outpatient Medications on File Prior to Visit  Medication Sig Dispense Refill  . anastrozole (ARIMIDEX) 1 MG tablet TAKE 1 TABLET BY MOUTH EVERY DAY (Patient taking differently: Take 1 mg by mouth daily. ) 90 tablet 1  . Biotin 5 MG TABS Take 5  mg by mouth daily.     . Cholecalciferol (VITAMIN D3) 250 MCG (10000 UT) capsule Take 10,000 Units by mouth daily.     . cyclobenzaprine (FLEXERIL) 10 MG tablet TAKE 1 TABLET BY MOUTH TWICE A DAY 60 tablet 1  . cycloSPORINE (RESTASIS) 0.05 % ophthalmic emulsion Place 1 drop into both eyes 2 (two) times daily.    [name] docusate sodium (COLACE) 100 MG capsule Take 100 mg by mouth daily.     . fluticasone (FLONASE) 50 MCG/ACT nasal spray SPRAY 2 SPRAYS INTO EACH NOSTRIL EVERY DAY (Patient taking differently: Place 2 sprays into both nostrils daily. ) 16 mL 2  . gabapentin (NEURONTIN) 300 MG capsule Take 1-2 capsules (300-600 mg  total) by mouth See admin instructions. Take 300 mg by mouth in the morning and take 600 mg by mouth at bedtime 90 capsule 0  . lamoTRIgine (LAMICTAL) 100 MG tablet Take 100 mg by mouth every morning.    . loratadine (CLARITIN) 10 MG tablet TAKE 1 TABLET BY MOUTH EVERY DAY (Patient taking differently: Take 10 mg by mouth daily. ) 30 tablet 6  . Lurasidone HCl (LATUDA) 60 MG TABS Take 60 mg by mouth daily.    . ondansetron (ZOFRAN) 8 MG tablet TAKE 1 TABLET (8 MG TOTAL) BY MOUTH EVERY 8 (EIGHT) HOURS AS NEEDED FOR NAUSEA OR VOMITING. 90 tablet 1  . pantoprazole (PROTONIX) 40 MG tablet Take 1 tablet (40 mg total) by mouth daily. 90 tablet 1  . traZODone (DESYREL) 100 MG tablet Take 1 tablet (100 mg total) by mouth at bedtime. (Patient taking differently: Take 300 mg by mouth at bedtime. ) 30 tablet 0  . fluconazole (DIFLUCAN) 100 MG tablet Take 1 tablet (100 mg total) by mouth daily. (Patient not taking: Reported on 11/21/2019) 10 tablet 0   Current Facility-Administered Medications on File Prior to Visit  Medication Dose Route Frequency Provider Last Rate Last Admin  . sodium chloride (PF) 0.9 % injection             Observations/Objective: Awake, alert, oriented x3 Not in acute distress  Assessment and Plan: 1. Acute non-recurrent sinusitis of other sinus Given OTC medications have been ineffective I will place her on an antibiotic as she is currently immunosuppressed - amoxicillin (AMOXIL) 500 MG capsule; Take 1 capsule (500 mg total) by mouth 3 (three) times daily.  Dispense: 30 capsule; Refill: 0  2. Metastatic breast cancer ([place]) S/p left mastectomy and axillary lymph node dissection with expander placement) s/p suboccipital craniectomy for resection of posterior fossa metastasis, s/p radiation to the brain and breast  3. Fall, subsequent encounter Secondary to balance issues CT head reveals improving subdural collection Fall precautions discussed Continue gait training with  PT   Follow Up Instructions: 105-months   I discussed the assessment and treatment plan with the patient. The patient was provided an opportunity to ask questions and all were answered. The patient agreed with the plan and demonstrated an understanding of the instructions.   The patient was advised to call back or seek an in-person evaluation if the symptoms worsen or if the condition fails to improve as anticipated.     I provided 13 minutes total of non-face-to-face time during this encounter including median intraservice time, reviewing previous notes, investigations, ordering medications, medical decision making, coordinating care and patient verbalized understanding at the end of the visit.     Charlott Rakes, MD, FAAFP. [facility], [place]  01/31/2020, 1:32 PM

## 2020-01-31 NOTE — Progress Notes (Signed)
States that both nostrils are stopped up and no medications are working

## 2020-01-31 NOTE — Addendum Note (Signed)
Addended by: Tora Kindred on: 01/31/2020 02:18 PM   Modules accepted: Orders

## 2020-02-01 ENCOUNTER — Ambulatory Visit: Payer: Medicaid Other | Admitting: Physical Therapy

## 2020-02-02 ENCOUNTER — Telehealth: Payer: Self-pay | Admitting: *Deleted

## 2020-02-02 ENCOUNTER — Encounter: Payer: Self-pay | Admitting: Oncology

## 2020-02-02 NOTE — Telephone Encounter (Signed)
This RN spoke with [name] who states she went to the [facility] and had a Covid test done which was negative.  " what else could it be on the CT "  This RN informed her she likely has a form of an infection - that is not Covid. She did go to her primary MD yesterday and was put on an antibiotic.  [name] will continue with current medications for symptoms and this note will be forwarded to MD for information.

## 2020-02-05 ENCOUNTER — Ambulatory Visit: Payer: Medicaid Other

## 2020-02-06 ENCOUNTER — Inpatient Hospital Stay: Payer: Medicaid Other

## 2020-02-06 ENCOUNTER — Telehealth: Payer: Self-pay | Admitting: Oncology

## 2020-02-06 ENCOUNTER — Inpatient Hospital Stay: Payer: Medicaid Other | Admitting: Oncology

## 2020-02-06 NOTE — Telephone Encounter (Signed)
I talk with patient regarding reschedule from 3/9 to 3/16

## 2020-02-07 ENCOUNTER — Other Ambulatory Visit: Payer: Self-pay | Admitting: Oncology

## 2020-02-07 ENCOUNTER — Other Ambulatory Visit: Payer: Self-pay | Admitting: *Deleted

## 2020-02-07 MED ORDER — IBUPROFEN 800 MG PO TABS: 800.0000 mg | ORAL_TABLET | Freq: Three times a day (TID) | ORAL | 0 refills | Status: DC

## 2020-02-12 NOTE — Progress Notes (Signed)
[place]  [phone] [phone]     ID: [name] DOB: 1961/03/21  MR#: [id number]  YOV#:[id number]  Patient Care Team: Charlott Rakes, MD as PCP - General (Family Medicine) Kyung Rudd, MD as Consulting Physician (Radiation Oncology) Erline Levine, MD as Consulting Physician (Neurosurgery) Corena Pilgrim, MD as Consulting Physician (Psychiatry) Mickeal Skinner Acey Lav, MD as Consulting Physician (Psychiatry) Nehemiah Settle, MD as Referring Physician (Plastic Surgery) Irene Limbo, MD as Consulting Physician (Plastic Surgery) Dillingham, Loel Lofty, DO as Attending Physician (Plastic Surgery)   CHIEF COMPLAINT: Metastatic triple positive breast cancer  CURRENT TREATMENT: trastuzumab [every 21 days], denosumab/Xgeva (Q6W), anastrozole   INTERVAL HISTORY: [name] returns today for follow up and treatment of her metastatic triple positive breast cancer.  She continues on trastuzumab, given every 21 days.  We switched back to this for maintenance after she took the TDM 1 for 8 months. Her most recent echocardiogram on 12/06/2019 showed an ejection fraction of 60-65%.  We normally repeat her echocardiograms every 6 months.  She is tolerating denosumab/Xgeva every 6 weeks with no side effects that she is aware of.    She also does generally well with anastrozole.  She has some vaginal dryness, and she is using some coconut oil regarding this.  Since her last visit, she underwent brain MRI on 01/11/2020 revealing: new right cerebral subdural hematoma, measuring up to 8 mm, with no mass-like or nodular dural thickening is demonstrated; no evidence of recurrent metastatic disease.  She underwent follow up head CT on 01/31/2020 for the hematoma, which showed a decrease in size to 6 mm. She is scheduled for follow up with Dr. Mickeal Skinner on 02/19/2020.  She also underwent bone scan on 01/31/2020 showing no evidence of skeletal metastasis.   Chest CT also performed  that day showed: new peribronchovascular ground-glass; new subpleural nodular consolidation in the left lower lobe; stable osseous metastatic disease.  In light of the new ground-glass seen on CT scan, she underwent Covid-19 testing on 02/02/2020 at [facility]. Fortunately, this was negative.  She is scheduled for right screening mammogram on 02/27/2020.   REVIEW OF SYSTEMS: [name] tells me she is finally living at her own place, 1 bedroom 1 bathroom.  She has lived there before.  She is very happy about this.  It is on a first floor.  Otherwise she continues to work part-time.  She denies cough, fever, or shortness of breath despite the changes noted in her recent CT of the chest.  She did receive antibiotics for that and she feels that helped.  She is not wearing her compression sleeve in the left arm because she says she is "lazy".  She tells me family is doing well and all of them she says have had the COVID-19 infection.  They are all got over it without complications as far she can tell.   BREAST CANCER HISTORY: From the original intake note:  [name] was aware of a "lemon sized lump in" her left axilla for about a year before bringing it to medical attention. By then she had developed left breast and left axillary swelling (June 2016). She presented to the local emergency room and had a chest CT scan 06/06/2015 which showed a nodule in the left breast measuring 0.9 cm and questionable left axillary adenopathy. She then proceeded to bilateral diagnostic mammography and left breast ultrasonography 06/19/2015. There were no prior films for comparison (last mammography 12 years prior).. The breast density was category C. Mammography showed in  the left breast upper inner quadrant a 7 cm area including a small mass and significant pleomorphic calcifications. Ultrasonography defined the mass as measuring 1.2 cm. The left axilla appeared unremarkable. There was significant skin edema.  Biopsy of the left breast  mass 06/19/2015 showed (SP [id number]) an invasive ductal carcinoma, grade 2, estrogen receptor 83% positive, progesterone receptor 26% positive, and HER-2 amplified by immunohistochemstry with a 3+ reading. The patient had biopsies of a separate area in the left breast August of the same year and this showed atypical ductal hyperplasia. (SP [id number]).  Accordingly after appropriate discussion on 08/21/2015 the patient proceeded to left mastectomy with left axillary sentinel lymph node sampling, which, since the lymph nodes were positive, extended to the procedure to left axillary lymph node dissection. The pathology (SP [id number]) showed an invasive ductal carcinoma, grade 3, measuring in excess of 9 cm. There were also skin satellites, not contiguous with the invasive carcinoma. Margins were clear and ample. There was evidence of lymphovascular invasion. A total of 15 lymph nodes were removed, including 5 sentinel lymph nodes, all of which were positive, so that the final total was 14 out of 15 lymph nodes involved by tumor. There was evidence of extranodal extension. The final pathology was pT4b pN2a, stage IIIB  CA-27-29 and CEA 09/19/2015 were non-informative October 2016.  Unfortunately CT scans of the chest abdomen and pelvis 09/16/2015 showed bony metastases to the ri/ght scapula, left iliac crest, and also L4 and T-spine. There were questionable liver cysts which on repeat CT scan 03/02/2016 appear to be a little bit more well-defined, possibly a little larger. There were also some possible right upper lobe lung lesions.  Adjuvant treatment consisted of docetaxel, trastuzumab and pertuzumab, with the final (6th) docetaxel dose given 02/11/2016. She continues on trastuzumab and pertuzumab, with the 11th cycle given 05/05/2016. Echocardiogram 02/26/2016 showed an ejection fraction of 55%. She receives denosumab/Xgeva every 4 weeks.. She also receives radiation, started 06/09/201, to be completed  06/26/2016.  Her subsequent history is as detailed below   PAST MEDICAL HISTORY: Past Medical History:  Diagnosis Date   Alcohol abuse    none since 2013   Anemia    during chemo   Anxiety    At age 45   Arthritis Dx 2010   Bipolar disorder ([place])    Bronchitis    Cancer ([place])    breast mets to brain   Chronic pain    resolved per patient 12/25/19   Depression    Family history of adverse reaction to anesthesia    MOther had PONV   Fibromyalgia Dx 2005   GERD (gastroesophageal reflux disease)    Headache    hx  migraines   Lymphedema of left arm    Opiate dependence ([facility])    PONV (postoperative nausea and vomiting)    Port-A-Cath in place    PTSD (post-traumatic stress disorder)    S/P endometrial ablation    in md's office    PAST SURGICAL HISTORY: Past Surgical History:  Procedure Laterality Date   APPLICATION OF CRANIAL NAVIGATION N/A 08/14/2016   Procedure: APPLICATION OF CRANIAL NAVIGATION;  Surgeon: Erline Levine, MD;  Location: [facility];  Service: Neurosurgery;  Laterality: N/A;   BREAST RECONSTRUCTION Left    with silicone implant   COLONOSCOPY W/ POLYPECTOMY     CRANIOTOMY N/A 08/14/2016   Procedure: CRANIOTOMY TUMOR EXCISION WITH Lucky Rathke;  Surgeon: Erline Levine, MD;  Location: [facility];  Service: Neurosurgery;  Laterality: N/A;  FIBULA FRACTURE SURGERY Left    MASTECTOMY Left    RADIOLOGY WITH ANESTHESIA N/A 07/23/2016   Procedure: MRI OF BRAIN WITH AND WITHOUT;  Surgeon: Medication Radiologist, MD;  Location: [facility];  Service: Radiology;  Laterality: N/A;   RADIOLOGY WITH ANESTHESIA N/A 09/08/2016   Procedure: MRI OF BRAIN WITH AND WITHOUT CONTRAST;  Surgeon: Medication Radiologist, MD;  Location: [facility];  Service: Radiology;  Laterality: N/A;   RADIOLOGY WITH ANESTHESIA N/A 12/10/2016   Procedure: MRI OF BRAIN WITH AND WITHOUT;  Surgeon: Medication Radiologist, MD;  Location: [facility];  Service: Radiology;  Laterality: N/A;     RADIOLOGY WITH ANESTHESIA N/A 03/02/2017   Procedure: MRI of BRAIN W and W/OUT CONTRAST;  Surgeon: Medication Radiologist, MD;  Location: [facility];  Service: Radiology;  Laterality: N/A;   RADIOLOGY WITH ANESTHESIA N/A 07/29/2017   Procedure: RADIOLOGY WITH ANESTHESIA MRI OF BRAIN WITH AND WITHOUT CONTRAST;  Surgeon: Radiologist, Medication, MD;  Location: [facility];  Service: Radiology;  Laterality: N/A;   RADIOLOGY WITH ANESTHESIA N/A 12/07/2017   Procedure: MRI WITH ANESTHESIA OF BRAIN WITH AND WITHOUT CONTRAST;  Surgeon: Radiologist, Medication, MD;  Location: [facility];  Service: Radiology;  Laterality: N/A;   RADIOLOGY WITH ANESTHESIA N/A 04/07/2018   Procedure: MRI OF BRAIN WITH AND WITHOUT CONTRAST;  Surgeon: Radiologist, Medication, MD;  Location: [facility];  Service: Radiology;  Laterality: N/A;   RADIOLOGY WITH ANESTHESIA N/A 08/23/2018   Procedure: MRI WITH ANESTHESIA OF THE BRAIN WITH AND WITHOUT;  Surgeon: Radiologist, Medication, MD;  Location: [facility];  Service: Radiology;  Laterality: N/A;   RADIOLOGY WITH ANESTHESIA N/A 01/24/2019   Procedure: MRI OF BRAIN WITH AND WITHOUT CONTRAST;  Surgeon: Radiologist, Medication, MD;  Location: [facility];  Service: Radiology;  Laterality: N/A;   RADIOLOGY WITH ANESTHESIA N/A 07/06/2019   Procedure: MRI WITH ANESTHESIA OF BRAIN WITH AND WITHOUT CONTRAST;  Surgeon: Radiologist, Medication, MD;  Location: [facility];  Service: Radiology;  Laterality: N/A;   RADIOLOGY WITH ANESTHESIA N/A 01/11/2020   Procedure: MRI WITH ANESTHESIA BRAIN WITH AND WITHOUT;  Surgeon: Radiologist, Medication, MD;  Location: [facility];  Service: Radiology;  Laterality: N/A;   right power port placement Right     FAMILY HISTORY Family History  Problem Relation Age of Onset   Diabetes Mother    Bipolar disorder Mother    CAD Father    The patient's father still living, age 63, in [place]. He had prostate cancer at some point in the past. The patient's mother died at age 34 from  complications of diabetes. The patient had no brothers, 2 sisters. A paternal grandmother had lung cancer in the setting of tobacco abuse. There is no other history of cancer in the family to her knowledge   GYNECOLOGIC HISTORY:  No LMP recorded. Patient has had an ablation. Menarche approximately age 67. First live birth in 48. The patient is GX P2. She underwent endometrial ablation in 2016.   SOCIAL HISTORY: Updated August 2019 The patient is originally from [place]. She has lived in [place] before but more recently was in [place]. She is back here because she could not afford her rent in [place]. She is divorced. Her 2 children are [name] who lives in [place] and works as a Development worker, community, and [name] who also lives in [place] and works as a Catering manager. The patient has a grandchild, [name], 80 years old as of July 2019, living in [place] with his mother. The patient also has a grandson born  in February 2019, who lives in [place]. The patient has not established herself with a local church yet.    ADVANCED DIRECTIVES: Not in place; at the 06/03/2016 visit the patient was given the appropriate forms to complete and notarize at her discretion   HEALTH MAINTENANCE: Social History   Tobacco Use   Smoking status: Current Every Day Smoker    Packs/day: 0.75    Years: 43.00    Pack years: 32.25    Types: Cigarettes   Smokeless tobacco: Never Used  Substance Use Topics   Alcohol use: No    Comment: no ETOH since 08/22/12   Drug use: No    Comment: states she's in recovery program for 7 years     Colonoscopy:  PAP:  Bone density:   Allergies  Allergen Reactions   Meperidine Nausea And Vomiting and Itching   Demerol [Meperidine Hcl] Itching and Nausea And Vomiting    INTOLERANCE >  N & V   Erythromycin Rash    Current Outpatient Medications on File Prior to Visit  Medication Sig Dispense Refill   amoxicillin (AMOXIL) 500 MG capsule Take 1 capsule (500  mg total) by mouth 3 (three) times daily. 30 capsule 0   anastrozole (ARIMIDEX) 1 MG tablet TAKE 1 TABLET BY MOUTH EVERY DAY (Patient taking differently: Take 1 mg by mouth daily. ) 90 tablet 1   Biotin 5 MG TABS Take 5 mg by mouth daily.      Cholecalciferol (VITAMIN D3) 250 MCG (10000 UT) capsule Take 10,000 Units by mouth daily.      cyclobenzaprine (FLEXERIL) 10 MG tablet TAKE 1 TABLET BY MOUTH TWICE A DAY 60 tablet 1   cycloSPORINE (RESTASIS) 0.05 % ophthalmic emulsion Place 1 drop into both eyes 2 (two) times daily.     docusate sodium (COLACE) 100 MG capsule Take 100 mg by mouth daily.      fluconazole (DIFLUCAN) 100 MG tablet Take 1 tablet (100 mg total) by mouth daily. (Patient not taking: Reported on 11/21/2019) 10 tablet 0   fluticasone (FLONASE) 50 MCG/ACT nasal spray SPRAY 2 SPRAYS INTO EACH NOSTRIL EVERY DAY (Patient taking differently: Place 2 sprays into both nostrils daily. ) 16 mL 2   gabapentin (NEURONTIN) 300 MG capsule Take 1-2 capsules (300-600 mg total) by mouth See admin instructions. Take 300 mg by mouth in the morning and take 600 mg by mouth at bedtime 90 capsule 0   ibuprofen (ADVIL) 800 MG tablet Take 1 tablet (800 mg total) by mouth 3 (three) times daily. 90 tablet 0   lamoTRIgine (LAMICTAL) 100 MG tablet Take 100 mg by mouth every morning.     loratadine (CLARITIN) 10 MG tablet TAKE 1 TABLET BY MOUTH EVERY DAY (Patient taking differently: Take 10 mg by mouth daily. ) 30 tablet 6   Lurasidone HCl (LATUDA) 60 MG TABS Take 60 mg by mouth daily.     ondansetron (ZOFRAN) 8 MG tablet TAKE 1 TABLET (8 MG TOTAL) BY MOUTH EVERY 8 (EIGHT) HOURS AS NEEDED FOR NAUSEA OR VOMITING. 90 tablet 1   pantoprazole (PROTONIX) 40 MG tablet Take 1 tablet (40 mg total) by mouth daily. 90 tablet 1   traZODone (DESYREL) 100 MG tablet Take 1 tablet (100 mg total) by mouth at bedtime. (Patient taking differently: Take 300 mg by mouth at bedtime. ) 30 tablet 0   No current  facility-administered medications on file prior to visit.    OBJECTIVE: Middle-aged white woman in no acute distress  Vitals:   02/13/20 0920  BP: 120/69  Pulse: 88  Resp: 18  Temp: 98.3 F (36.8 C)  TempSrc: Temporal  SpO2: 96%  Weight: 137 lb 4.8 oz (62.3 kg)  Height: _0  (1.651 m)  Body mass index is 22.85 kg/m.   ECOG FS: 1 - Symptomatic but completely ambulatory   Sclerae unicteric, EOMs intact, pupils round and equal Wearing a mask No cervical or supraclavicular adenopathy Lungs no rales or rhonchi Heart regular rate and rhythm Abd soft, nontender, positive bowel sounds MSK no focal spinal tenderness, chronic grade 2 left upper extremity lymphedema, compression sleeve not in place Neuro: nonfocal, appears well oriented, positive affect Breasts: The right breast is benign.  She is status post left mastectomy with reconstruction.  There is no evidence of local recurrence.  Both axillae are benign.   LAB RESULTS: No results found for: LABCA2  CBC    Component Value Date/Time   WBC 2.7 (L) 02/13/2020 0845   RBC 3.95 02/13/2020 0845   HGB 11.7 (L) 02/13/2020 0845   HGB 13.5 07/05/2019 1129   HGB 12.1 10/26/2017 1038   HCT 35.5 (L) 02/13/2020 0845   HCT 35.7 10/26/2017 1038   PLT 51 (L) 02/13/2020 0845   PLT 54 (L) 07/05/2019 1129   PLT 167 10/26/2017 1038   MCV 89.9 02/13/2020 0845   MCV 98.4 10/26/2017 1038   MCH 29.6 02/13/2020 0845   MCHC 33.0 02/13/2020 0845   RDW 15.9 (H) 02/13/2020 0845   RDW 13.3 10/26/2017 1038   LYMPHSABS 0.6 (L) 02/13/2020 0845   LYMPHSABS 0.5 (L) 10/26/2017 1038   MONOABS 0.3 02/13/2020 0845   MONOABS 0.1 10/26/2017 1038   EOSABS 0.0 02/13/2020 0845   EOSABS 0.0 10/26/2017 1038   BASOSABS 0.0 02/13/2020 0845   BASOSABS 0.0 10/26/2017 1038   CMP Latest Ref Rng & Units 02/13/2020 01/09/2020 12/12/2019  Glucose 70 - 99 mg/dL 92 92 96  BUN 6 - 20 mg/dL _1 Creatinine 0.44 - 1.00 mg/dL 0.88 0.83 0.88  Sodium 135 - 145  mmol/L 142 140 143  Potassium 3.5 - 5.1 mmol/L 4.2 3.7 3.7  Chloride 98 - 111 mmol/L 105 105 106  CO2 22 - 32 mmol/L _2 Calcium 8.9 - 10.3 mg/dL 9.6 8.8(L) 8.6(L)  Total Protein 6.5 - 8.1 g/dL 6.6 6.7 6.7  Total Bilirubin 0.3 - 1.2 mg/dL 0.4 0.5 0.5  Alkaline Phos 38 - 126 U/L 76 77 80  AST 15 - 41 U/L 30 41 35  ALT 0 - 44 U/L _3 STUDIES: CT Head W Wo Contrast  Result Date: 01/31/2020 CLINICAL DATA:  Head trauma with subdural hematoma. Brain metastasis. EXAM: CT HEAD WITHOUT AND WITH CONTRAST TECHNIQUE: Contiguous axial images were obtained from the base of the skull through the vertex without and with intravenous contrast CONTRAST:  42m OMNIPAQUE IOHEXOL 300 MG/ML  SOLN COMPARISON:  MRI of the brain January 11, 2020 FINDINGS: Brain: Residual mixed density subdural collection is noted in the right cerebral convexity, measuring up to 6 mm in thickness. There is minimal mass effect the cerebral sulci in the right cerebral hemisphere without midline shift. Dural thickening and enhancement is no longer seen. However, this may be due to differences in technique. Surgical cavity the left cerebellar hemisphere is unchanged. No associated contrast enhancement seen. Confluent hypodensity of the periventricular white matter, likely corresponding to posttreatment changes. No evidence of acute infarction, hydrocephalus or mass.  Vascular: Calcified plaques are noted in the bilateral carotid siphons. Skull: Postsurgical changes from left suboccipital craniotomy. No fracture identified. Sinuses/Orbits: Mild mucosal thickening of ethmoid cells. The orbits are maintained. Other: None. IMPRESSION: Interval decrease in size of a al mixed density subdural collection in the right cerebral convexity, measuring up to 6 mm in thickness. No midline shift. Electronically Signed   By: Pedro Earls M.D.   On: 01/31/2020 12:22   CT Chest W Contrast  Result Date: 01/31/2020 CLINICAL DATA:   Breast cancer.  Treatment in progress. EXAM: CT CHEST WITH CONTRAST TECHNIQUE: Multidetector CT imaging of the chest was performed during intravenous contrast administration. CONTRAST:  48m OMNIPAQUE IOHEXOL 300 MG/ML  SOLN COMPARISON:  08/17/2019. FINDINGS: Cardiovascular: Right IJ Port-A-Cath terminates in the low SVC. Coronary artery calcification. Heart is mildly enlarged. No pericardial effusion. Mediastinum/Nodes: Subcentimeter low-attenuation lesions in the thyroid. No follow-up recommended (ref: J Am Coll Radiol. 2015 Feb;12(2): 143-50).No pathologically enlarged mediastinal, hilar, internal mammary or axillary lymph nodes. Esophagus is unremarkable. Lungs/Pleura: Subpleural radiation fibrosis in the anterior left upper lobe. New peribronchovascular ground-glass bilaterally, left greater than right, predominantly peripheral in location with some interstitial involvement in the left upper lobe. New subpleural nodular consolidation in the left lower lobe measures 0.7 x 1.6 cm (8/66). No pleural fluid. Airway is unremarkable. Upper Abdomen: Subcentimeter low-attenuation lesions in the liver are too small to characterize but stable. Visualized portions of the liver, gallbladder, adrenal glands, kidneys, spleen, pancreas, stomach and bowel are otherwise grossly unremarkable. Musculoskeletal: Sclerotic lesions in the T6, T9 and L1 vertebral bodies as well as right fifth anterolateral rib are unchanged. No new lesions. IMPRESSION: 1. New peribronchovascular ground-glass is seen predominantly in a peripheral distribution and is asymmetric on the left. Findings can be seen with COVID-19 pneumonia or possibly drug toxicity. 2. Subpleural nodular consolidation in the left lower lobe, new from 08/17/2019. While this may represent an infectious or inflammatory lesion, malignancy is difficult to exclude. Continued attention on follow-up exams is warranted. Stable osseous metastatic disease. 3. Coronary artery  calcification. Electronically Signed   By: MLorin PicketM.D.   On: 01/31/2020 12:15   NM Bone Scan Whole Body  Result Date: 01/31/2020 : Patient has hx of left side breast cancer, bone metastases, brain metastases. Hx of craniotomy 2017. No recent falls/fx/trauma. EOV^21.388mlicurie TC-MDP TECHNETIUM TC 7M MEDRONATE IV KITBreast cancer, staging EXAM: NUCLEAR MEDICINE WHOLE BODY BONE SCAN TECHNIQUE: Whole body anterior and posterior images were obtained approximately 3 hours after intravenous injection of radiopharmaceutical. RADIOPHARMACEUTICALS:  21.3 mCi Technetium-9951mP IV COMPARISON:  None. FINDINGS: No abnormal radiotracer accumulation within the axillary or appendicular skeleton to localize skeletal metastasis. No change from comparison exam. Inferior sacrum obscured by bladder. IMPRESSION: No scintigraphic evidence skeletal metastasis. Electronically Signed   By: SteSuzy BouchardD.   On: 01/31/2020 20:26    ELIGIBLE FOR AVAILABLE RESEARCH PROTOCOL: no  ASSESSMENT: [age]o. [place] woman with stage IV left-sided breast cancer involving bone and central nervous system  (1) s/p left breast lower inner quadrant biopsy 06/19/2015 for a clinical T2-3 NX invasive ductal carcinoma, grade 2, triple positive.  (2) status post left mastectomy and axillary lymph node dissection  with immediate expander placement 07/18/2015 for an mpT4 pN2,stage IIIB invasive ductal carcinoma, grade 3, with negative margins.  (a) definitive implant exchange to be scheduled in December   METASTATIC DISEASE: October 2016  (3) CT scan of the chest abdomen and pelvis  09/16/2015 shows metastatic lesions  in the right scapula, left iliac crest, L4, and T spine. There were questionable liver cysts, with repeat CT scan 03/02/2016 showing possible right upper lobe lung lesions and possibly increased liver lesions  (a) CT scan of the chest 06/17/2016 shows no active disease in the lungs or liver  (b) Bone scan July 2017  showed no evidence of bony metastatic disease   (c) head CT 07/08/2016 showed a cerebellar lesion, confirmed by MRI 07/23/2016, status post craniotomy 08/14/2016, confirming a metastatic deposit which was estrogen and progesterone receptor negative, HER-2 amplified with a signals ratio of 7.16, number per cell 13.25  (d) CA 27-29 is not informative  (4) received docetaxel every 3 weeks 6 together with trastuzumab and pertuzumab, last docetaxel dose 02/11/2016  (5) adjuvant radiation7/03/2016 to 06/26/2016 at [facility]: 1. The Left chest wall was treated to 23.4 Gy in 13 fractions at 1.8 Gy per fraction. 2. The Left chest wall was boosted to 10 Gy in 5 fractions at 2 Gy per fraction. 3. The Left Sclav/PAB was treated to 23.4 Gy in 13 fractions at 1.8 Gy per fraction.  [Note: Including the patient's treatment in [place] (received 15 fractions per Dr. Maryan Rued near [place], [place]), the patient received 50.4 Gy to the left chest wall and supraclavicular region. ]  (6) started trastuzumab and pertuzumab October 2016, continuing every 3 weeks,  (a) echocardiogram 02/26/2016 showed a well preserved ejection fraction  (b) echocardiogram 07/01/2016 shows an ejection fraction in the 60-65%   (c) pertuzumab discontinued 10/2016 with uncontrolled diarrhea  (d) echocardiogram 11/11/2016 showed an ejection fraction in the 60-65%  (e) echocardiogram 03/03/2017 shows an ejection fraction of 60-65%  (f) echocardiogram on 05/19/2017 shows an ejection fraction of 55-60%  (g) echocardiogram 09/24/2017 shows the ejection fraction in the 60-65%  (h) echocardiogram 02/14/2018 shows an ejection fraction in the 60-65%  (I) echocardiogram  06/30/2018 shows an ejection fraction in the 55-60%  (m) echocardiogram on 12/08/2018 shows an ejection fraction in the 60-65% range  (7) started denosumab/Xgeva October 2017 given every 4 weeks, transitioned to every 8 weeks beginning 10/11/18 (every 6 weeks while giving TDM1  every 3 weeks)  (8) started anastrozole October 2017   (a) bone scan 11/10/2016 shows no active disease  (b) chest CT scan 11/10/2016 stable, with no evidence of active disease  (c) chest CT and bone scan 07/02/2017 show no evidence of active disease  (d) CT scan of the chest with contrast 11/10/2017 shows some left axillary edema, but no evidence of thrombosis or adenopathy in that area, 0.9 cm precarinal lymph and 0.7 cm right upper lobe nodule node; bone lesions stable  (e) CT of the chest 05/04/2018 shows a 1.4 cm right lower paratracheal node which is slightly increased and a new right prevascular mediastinal node measuring 0.7 cm.  Bone lesions are stable.  (f) chest CT on 07/01/2018 shows no definite findings of metastatic disease in the thorax. Previously noted borderline enlarged low right paratracheal lymph node is stable to slightly decreased in size   (e) chest CT on 12/30/2018 notes mild increase in right paratracheal adneopathy, recommended PET scan.  Pet scan on 01/19/2019 showed a hypermetabolic and enlarged right paratracheal lymph node consistent with breast cancer recurrence.  (f)Trastuzumab discontinued due to February 2020 scans   (g) TDM-1 started on 01/31/2019 given every 21 days.  (h) Chest CT 05/15/2019 shows decrease in mediastinal adenopathy  (i) CT chest on 08/17/2019 shows resolution of thoracic adenopathy  (j) TDM 1 discontinued  after 10/11/2019 dose (8 months treatment) because of thrombocytopenia  (k) trastuzumab resumed 11/01/2019, repeated every 21 days  (l) echocardiogram 12/06/2019 showed an ejection fraction in the 60-65% range  (m) chest CT scan, brain CT and bone scan 01/31/2020 show no evidence of active disease  (9) history of bipolar disorder  (a) currently on Lamictal and Latuda as well as Desiree L and Neurontin  (10) mild anemia with a significant drop in the MCV, ferritin 10 06/03/2016,   (a) Feraheme given 06/12/2016 and 06/18/2016  (11) tobacco  abuse: Chantix started 06/18/2016--she is not currently trying to quit   (12) 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  (a) brain irradiation10/19/17-11/08/17: Whole brain/ 35 Gy in 14 fractions   (b) repeat brain MRI obtained 12/10/2016 shows no active disease in the brain  (c) repeat brain MRI 03/02/2017 shows no evidence of residual or recurrent disease  (d) repeat brain MRI 07/29/2017 shows no evidence of residual or recurrent disease  (e) repeat brain MRI 12/07/2017 shows no evidence of disease recurrence.  There is progressive white matter change secondary to prior treatment.  (f) repeat brain MRI 04/07/2018 showed no evidence of disease\  (g) repeat brain MRI on 08/23/2018 shows no evidence of disease  (h) repeat brain MRI on 01/25/2019 shows no evidence of disease  (I) brain MRI 07/06/2019 shows no evidence of active disease  (j) brain MRI 01/11/2020 shows a subdural hematoma measuring 0.8 cm but no evidence of recurrent metastatic disease   PLAN: Tita is now 4-1/2 years out from definitive diagnosis of metastatic breast cancer.  Her disease is very well controlled and there is currently no evidence of active disease.  I am delighted that she has her own place.  She will be happier and safe for there.  She thought she was due for repeat MRI.  I told her that she had an MRI in February and then a head CT this month.  Nevertheless she wanted me to check with Dr. Mickeal Skinner and I was glad to do that.  She does have an appointment with him but there is no need for her to have yet another brain MRI at this point.  We are doing echocardiograms every 6 months so her next one will be due May or June.  We are doing her trastuzumab every 21 days.  If when she gets restaged in June as she continues to have no evidence of active disease we will switch back to every 4 weeks at that point  She is scheduled to receive her first vaccine dose later this week.  I  commended her on going through with that.  Otherwise she will see [place] again late April and mid June.  She knows to call for any other issues that may develop before that visit  Total encounter time 40 minutes.Sarajane Jews C. Joeangel Jeanpaul, MD  02/14/20 9:38 AM Medical Oncology and Hematology [facility] [place], [place] [address] Tel. [phone]    Fax. [phone]   I, Wilburn Mylar, am acting as scribe for Dr. Virgie Dad. Ari Bernabei.  I, Lurline Del MD, have reviewed the above documentation for accuracy and completeness, and I agree with the above.   *Total Encounter Time as defined by the Centers for Medicare and Medicaid Services includes, in addition to the face-to-face time of a patient visit (documented in the note above) non-face-to-face time: obtaining and reviewing outside history, ordering and reviewing medications,  tests or procedures, care coordination (communications with other health care professionals or caregivers) and documentation in the medical record.

## 2020-02-13 ENCOUNTER — Other Ambulatory Visit: Payer: Self-pay

## 2020-02-13 ENCOUNTER — Inpatient Hospital Stay: Payer: Medicaid Other | Attending: Medical

## 2020-02-13 ENCOUNTER — Inpatient Hospital Stay: Payer: Medicaid Other

## 2020-02-13 ENCOUNTER — Inpatient Hospital Stay (HOSPITAL_BASED_OUTPATIENT_CLINIC_OR_DEPARTMENT_OTHER): Payer: Medicaid Other | Admitting: Oncology

## 2020-02-13 VITALS — BP 120/69 | HR 88 | Temp 98.3°F | Resp 18 | Ht 65.0 in | Wt 137.3 lb

## 2020-02-13 DIAGNOSIS — C770 Secondary and unspecified malignant neoplasm of lymph nodes of head, face and neck: Secondary | ICD-10-CM | POA: Diagnosis not present

## 2020-02-13 DIAGNOSIS — C7951 Secondary malignant neoplasm of bone: Secondary | ICD-10-CM

## 2020-02-13 DIAGNOSIS — C7931 Secondary malignant neoplasm of brain: Secondary | ICD-10-CM | POA: Diagnosis not present

## 2020-02-13 DIAGNOSIS — C50312 Malignant neoplasm of lower-inner quadrant of left female breast: Secondary | ICD-10-CM | POA: Insufficient documentation

## 2020-02-13 DIAGNOSIS — D696 Thrombocytopenia, unspecified: Secondary | ICD-10-CM

## 2020-02-13 DIAGNOSIS — C50119 Malignant neoplasm of central portion of unspecified female breast: Secondary | ICD-10-CM

## 2020-02-13 DIAGNOSIS — Z5112 Encounter for antineoplastic immunotherapy: Secondary | ICD-10-CM | POA: Insufficient documentation

## 2020-02-13 DIAGNOSIS — C50011 Malignant neoplasm of nipple and areola, right female breast: Secondary | ICD-10-CM | POA: Diagnosis not present

## 2020-02-13 DIAGNOSIS — C50012 Malignant neoplasm of nipple and areola, left female breast: Secondary | ICD-10-CM

## 2020-02-13 LAB — COMPREHENSIVE METABOLIC PANEL
ALT: 17 U/L (ref 0–44)
AST: 30 U/L (ref 15–41)
Albumin: 3.7 g/dL (ref 3.5–5.0)
Alkaline Phosphatase: 76 U/L (ref 38–126)
Anion gap: 8 (ref 5–15)
BUN: 12 mg/dL (ref 6–20)
CO2: 29 mmol/L (ref 22–32)
Calcium: 9.6 mg/dL (ref 8.9–10.3)
Chloride: 105 mmol/L (ref 98–111)
Creatinine, Ser: 0.88 mg/dL (ref 0.44–1.00)
GFR calc Af Amer: >60 mL/min (ref >60)
GFR calc non Af Amer: >60 mL/min (ref >60)
Glucose, Bld: 92 mg/dL (ref 70–99)
Potassium: 4.2 mmol/L (ref 3.5–5.1)
Sodium: 142 mmol/L (ref 135–145)
Total Bilirubin: 0.4 mg/dL (ref 0.3–1.2)
Total Protein: 6.6 g/dL (ref 6.5–8.1)

## 2020-02-13 LAB — CBC WITH DIFFERENTIAL/PLATELET
Abs Immature Granulocytes: 0.01 10*3/uL (ref 0.00–0.07)
Basophils Absolute: 0 10*3/uL (ref 0.0–0.1)
Basophils Relative: 1 %
Eosinophils Absolute: 0 10*3/uL (ref 0.0–0.5)
Eosinophils Relative: 0 %
HCT: 35.5 % — ABNORMAL LOW (ref 36.0–46.0)
Hemoglobin: 11.7 g/dL — ABNORMAL LOW (ref 12.0–15.0)
Immature Granulocytes: 0 %
Lymphocytes Relative: 21 %
Lymphs Abs: 0.6 10*3/uL — ABNORMAL LOW (ref 0.7–4.0)
MCH: 29.6 pg (ref 26.0–34.0)
MCHC: 33 g/dL (ref 30.0–36.0)
MCV: 89.9 fL (ref 80.0–100.0)
Monocytes Absolute: 0.3 10*3/uL (ref 0.1–1.0)
Monocytes Relative: 10 %
Neutro Abs: 1.8 10*3/uL (ref 1.7–7.7)
Neutrophils Relative %: 68 %
Platelets: 51 10*3/uL — ABNORMAL LOW (ref 150–400)
RBC: 3.95 MIL/uL (ref 3.87–5.11)
RDW: 15.9 % — ABNORMAL HIGH (ref 11.5–15.5)
WBC: 2.7 10*3/uL — ABNORMAL LOW (ref 4.0–10.5)
nRBC: 0 % (ref 0.0–0.2)

## 2020-02-13 MED ORDER — ACETAMINOPHEN 325 MG PO TABS
650.0000 mg | ORAL_TABLET | Freq: Once | ORAL | Status: AC
  Administered 2020-02-13: 650 mg via ORAL

## 2020-02-13 MED ORDER — DENOSUMAB 120 MG/1.7ML ~~LOC~~ SOLN: 120.0000 mg | Freq: Once | SUBCUTANEOUS | Status: DC

## 2020-02-13 MED ORDER — HEPARIN SOD (PORK) LOCK FLUSH 100 UNIT/ML IV SOLN
500.0000 [IU] | Freq: Once | INTRAVENOUS | Status: AC | PRN
  Administered 2020-02-13: 500 [IU]
  Filled 2020-02-13: qty 5

## 2020-02-13 MED ORDER — TRASTUZUMAB-DKST CHEMO 150 MG IV SOLR
6.0000 mg/kg | Freq: Once | INTRAVENOUS | Status: AC
  Administered 2020-02-13: 399 mg via INTRAVENOUS
  Filled 2020-02-13: qty 19

## 2020-02-13 MED ORDER — DIPHENHYDRAMINE HCL 25 MG PO CAPS: 25.0000 mg | ORAL_CAPSULE | Freq: Once | ORAL | Status: DC

## 2020-02-13 MED ORDER — ACETAMINOPHEN 325 MG PO TABS
ORAL_TABLET | ORAL | Status: AC
  Filled 2020-02-13: qty 2

## 2020-02-13 MED ORDER — SODIUM CHLORIDE 0.9 % IV SOLN
Freq: Once | INTRAVENOUS | Status: AC
  Filled 2020-02-13: qty 250

## 2020-02-13 MED ORDER — DENOSUMAB 120 MG/1.7ML ~~LOC~~ SOLN
SUBCUTANEOUS | Status: AC
  Filled 2020-02-13: qty 1.7

## 2020-02-13 MED ORDER — SODIUM CHLORIDE 0.9% FLUSH
10.0000 mL | INTRAVENOUS | Status: DC | PRN
  Administered 2020-02-13: 10 mL
  Filled 2020-02-13: qty 10

## 2020-02-13 NOTE — Patient Instructions (Signed)
[facility] Discharge Instructions for Patients Receiving Chemotherapy  Today you received the following chemotherapy agents: trastuzumab (Ogivri).  To help prevent nausea and vomiting after your treatment, we encourage you to take your nausea medication as directed.   If you develop nausea and vomiting that is not controlled by your nausea medication, call the clinic.   BELOW ARE SYMPTOMS THAT SHOULD BE REPORTED IMMEDIATELY:  *FEVER GREATER THAN 100.5 F  *CHILLS WITH OR WITHOUT FEVER  NAUSEA AND VOMITING THAT IS NOT CONTROLLED WITH YOUR NAUSEA MEDICATION  *UNUSUAL SHORTNESS OF BREATH  *UNUSUAL BRUISING OR BLEEDING  TENDERNESS IN MOUTH AND THROAT WITH OR WITHOUT PRESENCE OF ULCERS  *URINARY PROBLEMS  *BOWEL PROBLEMS  UNUSUAL RASH Items with * indicate a potential emergency and should be followed up as soon as possible.  Feel free to call the clinic should you have any questions or concerns. The clinic phone number is [phone].  Please show the [place] at check-in to the Emergency Department and triage nurse.

## 2020-02-13 NOTE — Progress Notes (Signed)
MD would like to continue Ogivri at 6 mg/kg today as planned.  Kennith Center, Pharm.D., CPP 02/13/2020@10 :23 AM

## 2020-02-13 NOTE — Progress Notes (Signed)
When assessing patient for denosumab injection, she states she is scheduled for dental extractions on 03/12/2020. Dr. [name] aware and advised to hold denosumab. Carolynne Edouard (pharmacist) notified.

## 2020-02-14 ENCOUNTER — Other Ambulatory Visit: Payer: Self-pay | Admitting: *Deleted

## 2020-02-14 ENCOUNTER — Other Ambulatory Visit: Payer: Self-pay | Admitting: Oncology

## 2020-02-14 ENCOUNTER — Telehealth: Payer: Self-pay | Admitting: Oncology

## 2020-02-14 LAB — CANCER ANTIGEN 27.29: CA 27.29: 12.3 U/mL (ref 0.0–38.6)

## 2020-02-14 MED ORDER — ONDANSETRON HCL 8 MG PO TABS: 8.0000 mg | ORAL_TABLET | Freq: Three times a day (TID) | ORAL | 1 refills | Status: DC | PRN

## 2020-02-14 NOTE — Telephone Encounter (Signed)
Scheduled appts per 3/16 los. Left voicemail with appt details.

## 2020-02-15 ENCOUNTER — Ambulatory Visit: Payer: Medicaid Other | Attending: Adult Health | Admitting: Physical Therapy

## 2020-02-15 ENCOUNTER — Other Ambulatory Visit: Payer: Self-pay

## 2020-02-15 DIAGNOSIS — M6281 Muscle weakness (generalized): Secondary | ICD-10-CM | POA: Insufficient documentation

## 2020-02-15 DIAGNOSIS — R2689 Other abnormalities of gait and mobility: Secondary | ICD-10-CM | POA: Diagnosis present

## 2020-02-15 DIAGNOSIS — R2681 Unsteadiness on feet: Secondary | ICD-10-CM | POA: Insufficient documentation

## 2020-02-15 NOTE — Therapy (Signed)
[facility] [address] [place], [place], [address] Phone: [phone]   Fax:  [phone]  Physical Therapy Treatment  Patient Details  Name: [name] MRN: [id number] Date of Birth: 04/15/1961 Referring Provider (PT): Wilber Bihari, NP   Encounter Date: 02/15/2020  PT End of Session - 02/15/20 1336    Visit Number  17    Number of Visits  24   per recert 6/80/3212   Authorization Type  Medicaid (8 visits 02/15/2020-03/13/2020)    Authorization Time Period  2021 visits: 3 visits 12/07/19-12/27/19, 8 visits from 01/08/20- 02/04/20    Authorization - Visit Number  1    Authorization - Number of Visits  8    PT Start Time  2482   Pt arrives late   PT Stop Time  0928    PT Time Calculation (min)  33 min    Equipment Utilized During Treatment  Gait belt    Activity Tolerance  Patient tolerated treatment well;No increased pain    Behavior During Therapy  [facility] for tasks assessed/performed   continues with decreased safety awareness      Past Medical History:  Diagnosis Date  . Alcohol abuse    none since 2013  . Anemia    during chemo  . Anxiety    At age 76  . Arthritis Dx 2010  . Bipolar disorder ([place])   . Bronchitis   . Cancer ([place])    breast mets to brain  . Chronic pain    resolved per patient 12/25/19  . Depression   . Family history of adverse reaction to anesthesia    MOther had PONV  . Fibromyalgia Dx 2005  . GERD (gastroesophageal reflux disease)   . Headache    hx  migraines  . Lymphedema of left arm   . Opiate dependence ([place])   . PONV (postoperative nausea and vomiting)   . Port-A-Cath in place   . PTSD (post-traumatic stress disorder)   . S/P endometrial ablation    in md's office    Past Surgical History:  Procedure Laterality Date  . APPLICATION OF CRANIAL NAVIGATION N/A 08/14/2016   Procedure: APPLICATION OF CRANIAL NAVIGATION;  Surgeon: Erline Levine, MD;  Location: [facility] NEURO ORS;   Service: Neurosurgery;  Laterality: N/A;  . BREAST RECONSTRUCTION Left    with silicone implant  . COLONOSCOPY W/ POLYPECTOMY    . CRANIOTOMY N/A 08/14/2016   Procedure: CRANIOTOMY TUMOR EXCISION WITH Lucky Rathke;  Surgeon: Erline Levine, MD;  Location: [facility] NEURO ORS;  Service: Neurosurgery;  Laterality: N/A;  . FIBULA FRACTURE SURGERY Left   . MASTECTOMY Left   . RADIOLOGY WITH ANESTHESIA N/A 07/23/2016   Procedure: MRI OF BRAIN WITH AND WITHOUT;  Surgeon: Medication Radiologist, MD;  Location: [facility];  Service: Radiology;  Laterality: N/A;  . RADIOLOGY WITH ANESTHESIA N/A 09/08/2016   Procedure: MRI OF BRAIN WITH AND WITHOUT CONTRAST;  Surgeon: Medication Radiologist, MD;  Location: [facility];  Service: Radiology;  Laterality: N/A;  . RADIOLOGY WITH ANESTHESIA N/A 12/10/2016   Procedure: MRI OF BRAIN WITH AND WITHOUT;  Surgeon: Medication Radiologist, MD;  Location: [facility];  Service: Radiology;  Laterality: N/A;  . RADIOLOGY WITH ANESTHESIA N/A 03/02/2017   Procedure: MRI of BRAIN W and W/OUT CONTRAST;  Surgeon: Medication Radiologist, MD;  Location: [facility];  Service: Radiology;  Laterality: N/A;  . RADIOLOGY WITH ANESTHESIA N/A 07/29/2017   Procedure: RADIOLOGY WITH ANESTHESIA MRI OF BRAIN WITH AND WITHOUT CONTRAST;  Surgeon: Radiologist, Medication, MD;  Location: [facility];  Service: Radiology;  Laterality: N/A;  . RADIOLOGY WITH ANESTHESIA N/A 12/07/2017   Procedure: MRI WITH ANESTHESIA OF BRAIN WITH AND WITHOUT CONTRAST;  Surgeon: Radiologist, Medication, MD;  Location: [facility];  Service: Radiology;  Laterality: N/A;  . RADIOLOGY WITH ANESTHESIA N/A 04/07/2018   Procedure: MRI OF BRAIN WITH AND WITHOUT CONTRAST;  Surgeon: Radiologist, Medication, MD;  Location: [facility];  Service: Radiology;  Laterality: N/A;  . RADIOLOGY WITH ANESTHESIA N/A 08/23/2018   Procedure: MRI WITH ANESTHESIA OF THE BRAIN WITH AND WITHOUT;  Surgeon: Radiologist, Medication, MD;  Location: [facility];  Service: Radiology;  Laterality: N/A;  .  RADIOLOGY WITH ANESTHESIA N/A 01/24/2019   Procedure: MRI OF BRAIN WITH AND WITHOUT CONTRAST;  Surgeon: Radiologist, Medication, MD;  Location: [facility];  Service: Radiology;  Laterality: N/A;  . RADIOLOGY WITH ANESTHESIA N/A 07/06/2019   Procedure: MRI WITH ANESTHESIA OF BRAIN WITH AND WITHOUT CONTRAST;  Surgeon: Radiologist, Medication, MD;  Location: [facility];  Service: Radiology;  Laterality: N/A;  . RADIOLOGY WITH ANESTHESIA N/A 01/11/2020   Procedure: MRI WITH ANESTHESIA BRAIN WITH AND WITHOUT;  Surgeon: Radiologist, Medication, MD;  Location: [facility];  Service: Radiology;  Laterality: N/A;  . right power port placement Right     There were no vitals filed for this visit.  Subjective Assessment - 02/15/20 0857    Subjective  Moved into my own place, apartment on the first level, but it does have 4 steps and rails to get in.  No falls in the past month.  Per MD note and pt report, had a brain bleed from all the falls, but it has decreased in size.    Pertinent History  Per [employer] lymphedema eval 07/2019:  Treated here in March of last year currently with a reid sleeve, day sleeve and compression pump. Lt mastectomy and ALND with reconstruction 07/08/15 for T4N2 stage IIIB IDC. Metastatic disease in the brain s/p craniotomy 08/14/16. Chemo completed in 2017 Trastuzumab and Pertuzumab, docetaxel. Radiation completed 2017. Stable bone lesions to the Rt scapula, Lt iliac crest, L4, Tspine. Also probable in the liver and lungs. Pt is also bipolar and a recovered drug and alchohol user.  Current treatment TDM-1, denosumab and anastrozole.    Limitations  Other (comment)    Patient Stated Goals  To get more stability back in legs    Currently in Pain?  No/denies                       [facility] Adult PT Treatment/Exercise - 02/15/20 0900      Transfers   Transfers  Sit to Stand;Stand to Sit    Sit to Stand  5: Supervision;Without upper extremity assist;From chair/3-in-1    Sit to Stand  Details  Verbal cues for sequencing;Verbal cues for technique    Sit to Stand Details (indicate cue type and reason)  Cues for hand placement    Five time sit to stand comments   13.09    Stand to Sit  5: Supervision;To chair/3-in-1;Without upper extremity assist    Stand to Sit Details (indicate cue type and reason)  Verbal cues for sequencing;Verbal cues for technique;Verbal cues for precautions/safety    Stand to Sit Details  Cues for hand placement      Ambulation/Gait   Ambulation/Gait  Yes    Ambulation/Gait Assistance  4: Min guard;5: Supervision    Assistive device  Rollator  Gait Pattern  Step-through pattern;Decreased stride length;Shuffle;Narrow base of support;Poor foot clearance - left;Poor foot clearance - right    Ambulation Surface  Level;Indoor    Gait velocity  3.08 ft/sec    Stairs  Yes    Stairs Assistance  5: Supervision    Stair Management Technique  One rail Right;Alternating pattern;Forwards    Number of Stairs  4   x 2   Height of Stairs  6    Curb  4: Min assist   No device   Curb Details (indicate cue type and reason)  Pt reports whenever she is in community, friend will lift rollator      Berg Balance Test   Sit to Stand  Able to stand without using hands and stabilize independently    Standing Unsupported  Able to stand safely 2 minutes    Sitting with Back Unsupported but Feet Supported on Floor or Stool  Able to sit safely and securely 2 minutes    Stand to Sit  Sits safely with minimal use of hands    Transfers  Able to transfer safely, minor use of hands    Standing Unsupported with Eyes Closed  Able to stand 10 seconds with supervision    Standing Ubsupported with Feet Together  Needs help to attain position but able to stand for 30 seconds with feet together    From Standing, Reach Forward with Outstretched Arm  Can reach forward >12 cm safely (5")   max cues for task, 9"   From Standing Position, Pick up Object from Floor  Able to pick up shoe,  needs supervision    From Standing Position, Turn to Look Behind Over each Shoulder  Looks behind one side only/other side shows less weight shift    Turn 360 Degrees  Able to turn 360 degrees safely but slowly    Standing Unsupported, Alternately Place Feet on Step/Stool  Able to complete >2 steps/needs minimal assist    Standing Unsupported, One Foot in [employer] balance while stepping or standing    Standing on One Leg  Unable to try or needs assist to prevent fall    Total Score  36      Timed Up and Go Test   TUG  Normal TUG    Normal TUG (seconds)  21.59      Self-Care   Self-Care  Other Self-Care Comments    Other Self-Care Comments   Discussed POC, progress towards goals, as pt has been out of therapy > 1 month due to COVID quarantine.  Educated pt on this POC recert and plans to transition towards d/c in the next 4 weeks.  Highlighted again the importance of consistent performance of HEP; verbally reviewed standing exercises and reviewed pt's exercise chart (she brings in but has not been using).      Knee/Hip Exercises: Seated   Other Seated Knee/Hip Exercises  SEated ankle dorsiflexion x 10 reps (no band, as pt reports having difficulty with band at home).      Marching  Strengthening;Right;Left;1 set;10 reps    Sit to Sand  1 set;5 reps;without UE support   cues for hands on knees            PT Education - 02/15/20 1333    Education Details  POC, progress towards goals    Person(s) Educated  Patient    Methods  Explanation    Comprehension  Verbalized understanding       PT Short  Term Goals - 12/27/19 0729      PT SHORT TERM GOAL #1   Title  Pt will be independent with progression of HEP to address balance, strength, and gait.  TARGET 3 weeks (12/31/2019)    Baseline  12/27/19: doing current program a couple days a week without issues per pt report. will need to be updated as pt progresses    Status  Achieved    Target Date  12/31/19      PT SHORT TERM GOAL  #2   Title  Pt will improve 5x sit<>stand test to less than or equal to 11.5 seconds to demonstrate improved lower extremity functional strength.    Baseline  12/27/19: 10.68 sec's with intermittent UE support from standard height chair    Time  --    Period  --    Status  Achieved    Target Date  12/31/19      PT SHORT TERM GOAL #3   Title  Pt will stand for at least 10 minutes during PT sessions, without sitting breaks, for improved participation in ADLs and household tasks.    Baseline  12/27/19: pt able to participate in standing gait/actvities for 6-7 minutes before needing a seated rest break, improved just not to goal    Time  --    Period  --    Status  Partially Met    Target Date  12/31/19        PT Long Term Goals - 02/15/20 0914      PT LONG TERM GOAL #1   Title  Pt will be independent with progression of HEP and verbalize plans for continued community fitness upon d/c from PT.  TARGET 01/28/2020    Baseline  02/15/2020-reviewed HEP, but pt is not consistently performing    Time  7    Period  Weeks    Status  Not Met      PT LONG TERM GOAL #2   Title  Pt will improve Berg Balance score to at least 49/56 for decreased fall risk.    Baseline  12/27/19: 46/56 scored today; 02/15/2020 36/56    Time  7    Status  Not Met      PT LONG TERM GOAL #3   Title  Pt will improve gait velocity to at least 3 ft/sec for improved gait efficiency and safety.    Baseline  12/27/19: 2.79 ft/sec with rollator; 02/15/2020:  3.08 ft/sec    Time  7    Status  Achieved      PT LONG TERM GOAL #4   Title  Pt will negotiate 1 curb step to simulate step up into home, no rail, with supervision, for improved safety with curbs/home entry.    Baseline  12/27/19: min guard assist with ramp, min assist with curb with rollator; 02/15/2020 min assist with therapist, no device    Time  7    Period  Weeks    Status  Not Met      PT LONG TERM GOAL #5   Title  Pt will negotiate at least 4 steps with one  handrail with supervision, for improved safety with stair negotiation.    Baseline  12/27/19: min guard assist with single hand rail; 02/15/2020    Time  7    Period  Weeks    Status  Achieved            Plan - 02/15/20 1340    Clinical Impression Statement  Pt has not been seen for PT since 01/16/2020 session, due to pt being in COVID-quarantine due to contacts with people who had [place].  She returns today, LTGs assessed.  Pt has met 2 of 5 LTGS.  LTG 1 not met for independence with HEP; LTG 2 not met, with Berg score decreased from 46/56 to 36/56.  LTG 3 met for improved gait velocity.  LTG 4 not met for curb negotiation; LTG 5 met for stair negotiation.  Due to pt being out of therapy for > 1 month, pt will benefit from additional short course of PT to review and update HEP as needed, provide additional gait and safety training for gait/transfers, with plan to transition towards d/c from PT in the next 4 weeks.    Personal Factors and Comorbidities  Comorbidity 3+    Comorbidities  See PMH (subjective); metastatic breast cancer, hx of bipolar and anxiety    Examination-Activity Limitations  Stairs   Curbs   Examination-Participation Restrictions  --   Work   Stability/Clinical Decision Making  Stable/Uncomplicated    Rehab Potential  Good    PT Frequency 2x/wk      PT Duration 4 weeks (per recert 4/78/2956)   PT Treatment/Interventions  ADLs/Self Care Home Management;Neuromuscular re-education;Gait training;Stair training;Patient/family education;Therapeutic activities;Functional mobility training;Therapeutic exercise;Balance training;DME Instruction    PT Next Visit Plan  Review full HEP and ask if she has done HEP since 3/18 visit; continue gait training with rollator, outdoors gait, BLE strenghtening and balance    Consulted and Agree with Plan of Care  Patient       Patient will benefit from skilled therapeutic intervention in order to improve the following deficits and  impairments:  Abnormal gait, Difficulty walking, Decreased mobility, Decreased strength, Decreased balance  Visit Diagnosis: Other abnormalities of gait and mobility  Muscle weakness (generalized)  Unsteadiness on feet     Problem List Patient Active Problem List   Diagnosis Date Noted  . Brain metastases ([place]) 11/01/2019  . Thrombocytopenia ([place]) 08/09/2019  . Port-A-Cath in place 06/20/2019  . 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 S/P mastectomy, left 12/09/2018  . S/P breast reconstruction, left 12/09/2018  . S/P radiation therapy 12/09/2018  . GERD (gastroesophageal reflux disease) 12/10/2017  . Edema 11/09/2017  . Sensorineural hearing loss (SNHL) of both ears 11/05/2017  . Vitamin D deficiency 01/18/2017  . Bipolar I disorder, most recent episode depressed ([place]) 12/08/2016  . Adjustment disorder with anxiety 12/06/2016  . History of cancer metastatic to brain 07/27/2016  . Iron deficiency anemia 06/26/2016  . Bone metastases ([place]) 06/03/2016  . Primary cancer of lower-inner quadrant of left female breast ([place]) 06/01/2016  . Tobacco abuse 07/22/2015  . Alcoholism in remission ([place]) 07/22/2015  . Malignant neoplasm of female breast ([place]) 07/01/2015  . Current smoker 03/28/2015  . Goals of care, counseling/discussion 03/28/2015  . Seasonal allergies 03/28/2015  . Anxiety state 02/28/2015  . Fibromyalgia 02/28/2015  . Family history of diabetes mellitus 02/28/2015  . H/O alcohol abuse     Kebra Lowrimore W. 02/15/2020, 1:45 PM  Frazier Butt., PT   [facility] [address] [place], [place], [address] Phone: [phone]   Fax:  [phone]  Name: [name] MRN: [id number] Date of Birth: [birth year]   New goals per recert:  PT Long Term Goals - 02/15/20 0914      PT LONG TERM GOAL #1   Title  Pt will be independent  with progression of HEP and verbalize plans for  continued community fitness upon d/c from PT.  TARGET 03/13/2020    Baseline  02/15/2020-reviewed HEP, but pt is not consistently performing    Time  4    Period  Weeks    Status  On-going      PT LONG TERM GOAL #2   Title  Pt will improve Berg Balance score to at least 42/56 for decreased fall risk.    Baseline  12/27/19: 46/56 scored today; 02/15/2020 36/56    Time  4    Status  Revised      PT LONG TERM GOAL #3   Title  Pt will improve 5x sit<>stand to less than or equal to 11 seconds for improved transfer safety and functional strength.    Baseline  5x sit<>stand 13.8 sec 02/15/2020    Time  4    Status  Revised      PT LONG TERM GOAL #4   Title  Pt will ambulate at least 1000 ft, indoor and outdoor surfaces, with supervision, for improved community gait.    Baseline  fatigues easily with indoor gait with rollator    Time  4    Period  Weeks    Status  Revised      PT LONG TERM GOAL #5   Title  --    Baseline  --    Time  --    Period  --    Status  --      Mady Haagensen, PT 02/15/20 1:49 PM Phone: [phone] Fax: [phone]

## 2020-02-19 ENCOUNTER — Inpatient Hospital Stay (HOSPITAL_BASED_OUTPATIENT_CLINIC_OR_DEPARTMENT_OTHER): Payer: Medicaid Other | Admitting: Internal Medicine

## 2020-02-19 DIAGNOSIS — C7931 Secondary malignant neoplasm of brain: Secondary | ICD-10-CM

## 2020-02-19 NOTE — Progress Notes (Signed)
I connected with [name] on 02/19/20 at  9:00 AM EDT by telephone visit and verified that I am speaking with the correct person using two identifiers.  I discussed the limitations, risks, security and privacy concerns of performing an evaluation and management service by telemedicine and the availability of in-person appointments. I also discussed with the patient that there may be a patient responsible charge related to this service. The patient expressed understanding and agreed to proceed.  Other persons participating in the visit and their role in the encounter:  n/a  Patient's location:  Home  Provider's location:  Office  Chief Complaint:  Brain Metastases  History of Present Ilness: No new or progressive neurologic deficits.  Headaches continue to be sporadic and stable.  Denies seizures.  No recent falls.  Observations: Cognition and language normal  Imaging:  [facility] Clinician Interpretation: I have personally reviewed the CNS images as listed.  My interpretation, in the context of the patient's clinical presentation, is reduced subdural hematoma  CT Head W Wo Contrast  Result Date: 01/31/2020 CLINICAL DATA:  Head trauma with subdural hematoma. Brain metastasis. EXAM: CT HEAD WITHOUT AND WITH CONTRAST TECHNIQUE: Contiguous axial images were obtained from the base of the skull through the vertex without and with intravenous contrast CONTRAST:  41mL OMNIPAQUE IOHEXOL 300 MG/ML  SOLN COMPARISON:  MRI of the brain January 11, 2020 FINDINGS: Brain: Residual mixed density subdural collection is noted in the right cerebral convexity, measuring up to 6 mm in thickness. There is minimal mass effect the cerebral sulci in the right cerebral hemisphere without midline shift. Dural thickening and enhancement is no longer seen. However, this may be due to differences in technique. Surgical cavity the left cerebellar hemisphere is unchanged. No associated contrast enhancement seen. Confluent hypodensity  of the periventricular white matter, likely corresponding to posttreatment changes. No evidence of acute infarction, hydrocephalus or mass. Vascular: Calcified plaques are noted in the bilateral carotid siphons. Skull: Postsurgical changes from left suboccipital craniotomy. No fracture identified. Sinuses/Orbits: Mild mucosal thickening of ethmoid cells. The orbits are maintained. Other: None. IMPRESSION: Interval decrease in size of a al mixed density subdural collection in the right cerebral convexity, measuring up to 6 mm in thickness. No midline shift. Electronically Signed   By: Pedro Earls M.D.   On: 01/31/2020 12:22   CT Chest W Contrast  Result Date: 01/31/2020 CLINICAL DATA:  Breast cancer.  Treatment in progress. EXAM: CT CHEST WITH CONTRAST TECHNIQUE: Multidetector CT imaging of the chest was performed during intravenous contrast administration. CONTRAST:  62mL OMNIPAQUE IOHEXOL 300 MG/ML  SOLN COMPARISON:  08/17/2019. FINDINGS: Cardiovascular: Right IJ Port-A-Cath terminates in the low SVC. Coronary artery calcification. Heart is mildly enlarged. No pericardial effusion. Mediastinum/Nodes: Subcentimeter low-attenuation lesions in the thyroid. No follow-up recommended (ref: J Am Coll Radiol. 2015 Feb;12(2): 143-50).No pathologically enlarged mediastinal, hilar, internal mammary or axillary lymph nodes. Esophagus is unremarkable. Lungs/Pleura: Subpleural radiation fibrosis in the anterior left upper lobe. New peribronchovascular ground-glass bilaterally, left greater than right, predominantly peripheral in location with some interstitial involvement in the left upper lobe. New subpleural nodular consolidation in the left lower lobe measures 0.7 x 1.6 cm (8/66). No pleural fluid. Airway is unremarkable. Upper Abdomen: Subcentimeter low-attenuation lesions in the liver are too small to characterize but stable. Visualized portions of the liver, gallbladder, adrenal glands, kidneys,  spleen, pancreas, stomach and bowel are otherwise grossly unremarkable. Musculoskeletal: Sclerotic lesions in the T6, T9 and L1 vertebral bodies as well  as right fifth anterolateral rib are unchanged. No new lesions. IMPRESSION: 1. New peribronchovascular ground-glass is seen predominantly in a peripheral distribution and is asymmetric on the left. Findings can be seen with COVID-19 pneumonia or possibly drug toxicity. 2. Subpleural nodular consolidation in the left lower lobe, new from 08/17/2019. While this may represent an infectious or inflammatory lesion, malignancy is difficult to exclude. Continued attention on follow-up exams is warranted. Stable osseous metastatic disease. 3. Coronary artery calcification. Electronically Signed   By: Lorin Picket M.D.   On: 01/31/2020 12:15   NM Bone Scan Whole Body  Result Date: 01/31/2020 : Patient has hx of left side breast cancer, bone metastases, brain metastases. Hx of craniotomy 2017. No recent falls/fx/trauma. EOV^21.24millicurie TC-MDP TECHNETIUM TC 18M MEDRONATE IV KITBreast cancer, staging EXAM: NUCLEAR MEDICINE WHOLE BODY BONE SCAN TECHNIQUE: Whole body anterior and posterior images were obtained approximately 3 hours after intravenous injection of radiopharmaceutical. RADIOPHARMACEUTICALS:  21.3 mCi Technetium-58m MDP IV COMPARISON:  None. FINDINGS: No abnormal radiotracer accumulation within the axillary or appendicular skeleton to localize skeletal metastasis. No change from comparison exam. Inferior sacrum obscured by bladder. IMPRESSION: No scintigraphic evidence skeletal metastasis. Electronically Signed   By: Suzy Bouchard M.D.   On: 01/31/2020 20:26    Assessment and Plan: Clinically and radiographically stable.    Return following next MRI brain in 4 months.  I discussed the assessment and treatment plan with the patient.  The patient was provided an opportunity to ask questions and all were answered.  The patient agreed with the plan and  demonstrated understanding of the instructions.    The patient was advised to call back or seek an in-person evaluation if the symptoms worsen or if the condition fails to improve as anticipated.  I provided 5-10 minutes of non-face-to-face time during this enocunter.  Ventura Sellers, MD   I provided 10 minutes of non face-to-face telephone visit time during this encounter, and > 50% was spent counseling as documented under my assessment & plan.

## 2020-02-20 ENCOUNTER — Telehealth: Payer: Self-pay | Admitting: Internal Medicine

## 2020-02-20 NOTE — Telephone Encounter (Signed)
Scheduled appt per 3/22 los.  Spoke with pt and she is aware of the appt date and time.

## 2020-02-23 ENCOUNTER — Ambulatory Visit: Payer: Medicaid Other | Admitting: Rehabilitative and Restorative Service Providers"

## 2020-02-23 ENCOUNTER — Encounter: Payer: Self-pay | Admitting: Rehabilitative and Restorative Service Providers"

## 2020-02-23 ENCOUNTER — Other Ambulatory Visit: Payer: Self-pay

## 2020-02-23 DIAGNOSIS — R2681 Unsteadiness on feet: Secondary | ICD-10-CM

## 2020-02-23 DIAGNOSIS — R2689 Other abnormalities of gait and mobility: Secondary | ICD-10-CM | POA: Diagnosis not present

## 2020-02-23 DIAGNOSIS — M6281 Muscle weakness (generalized): Secondary | ICD-10-CM

## 2020-02-23 NOTE — Patient Instructions (Signed)
Access Code: [email or web address] URL: [email or web address] Date: 02/23/2020 Prepared by: Rosanne Ashing  Exercises Sit to Stand - 1 x daily - 7 x weekly - 2 sets - 10 reps Seated March - 1 x daily - 7 x weekly - 1-2 sets - 10 reps Seated Ankle Dorsiflexion with Resistance - 1 x daily - 7 x weekly - 1-2 sets - 10 reps Standing Hip Abduction - 1 x daily - 7 x weekly - 1 sets - 10 reps Standing Hip Extension - 1 x daily - 7 x weekly - 1 sets - 10 reps Heel Toe Raises with Counter Support - 1 x daily - 7 x weekly - 1 sets - 10 reps Alternating Step Taps with Counter Support - 1 x daily - 7 x weekly - 1 sets - 10 reps

## 2020-02-23 NOTE — Therapy (Addendum)
[facility] [address] [place], [place], [address] Phone: [phone]   Fax:  [phone]  Physical Therapy Treatment  Patient Details  Name: [name] MRN: [id number] Date of Birth: [birth year] Referring Provider (PT): Wilber Bihari, NP   Encounter Date: 02/23/2020  PT End of Session - 02/23/20 0941    Visit Number  18    Number of Visits  24   per recert [id number]   Authorization Type  Medicaid (8 visits 02/15/2020-03/13/2020)    Authorization Time Period  2021 visits: 3 visits 12/07/19-12/27/19, 8 visits from 01/08/20- 02/04/20    Authorization - Visit Number  2    Authorization - Number of Visits  8    PT Start Time  [id number]    PT Stop Time  0926    PT Time Calculation (min)  44 min    Equipment Utilized During Treatment  Gait belt    Activity Tolerance  Patient tolerated treatment well;No increased pain    Behavior During Therapy  [facility] for tasks assessed/performed   continues with decreased safety awareness      Past Medical History:  Diagnosis Date  . Alcohol abuse    none since 2013  . Anemia    during chemo  . Anxiety    At age 59  . Arthritis Dx 2010  . Bipolar disorder ([place])   . Bronchitis   . Cancer ([place])    breast mets to brain  . Chronic pain    resolved per patient 12/25/19  . Depression   . Family history of adverse reaction to anesthesia    MOther had PONV  . Fibromyalgia Dx 2005  . GERD (gastroesophageal reflux disease)   . Headache    hx  migraines  . Lymphedema of left arm   . Opiate dependence ([place])   . PONV (postoperative nausea and vomiting)   . Port-A-Cath in place   . PTSD (post-traumatic stress disorder)   . S/P endometrial ablation    in md's office    Past Surgical History:  Procedure Laterality Date  . APPLICATION OF CRANIAL NAVIGATION N/A 08/14/2016   Procedure: APPLICATION OF CRANIAL NAVIGATION;  Surgeon: Erline Levine, MD;  Location: [facility];  Service: Neurosurgery;   Laterality: N/A;  . BREAST RECONSTRUCTION Left    with silicone implant  . COLONOSCOPY W/ POLYPECTOMY    . CRANIOTOMY N/A 08/14/2016   Procedure: CRANIOTOMY TUMOR EXCISION WITH Lucky Rathke;  Surgeon: Erline Levine, MD;  Location: [facility];  Service: Neurosurgery;  Laterality: N/A;  . FIBULA FRACTURE SURGERY Left   . MASTECTOMY Left   . RADIOLOGY WITH ANESTHESIA N/A 07/23/2016   Procedure: MRI OF BRAIN WITH AND WITHOUT;  Surgeon: Medication Radiologist, MD;  Location: [facility];  Service: Radiology;  Laterality: N/A;  . RADIOLOGY WITH ANESTHESIA N/A 09/08/2016   Procedure: MRI OF BRAIN WITH AND WITHOUT CONTRAST;  Surgeon: Medication Radiologist, MD;  Location: [facility];  Service: Radiology;  Laterality: N/A;  . RADIOLOGY WITH ANESTHESIA N/A 12/10/2016   Procedure: MRI OF BRAIN WITH AND WITHOUT;  Surgeon: Medication Radiologist, MD;  Location: [facility];  Service: Radiology;  Laterality: N/A;  . RADIOLOGY WITH ANESTHESIA N/A 03/02/2017   Procedure: MRI of BRAIN W and W/OUT CONTRAST;  Surgeon: Medication Radiologist, MD;  Location: [facility];  Service: Radiology;  Laterality: N/A;  . RADIOLOGY WITH ANESTHESIA N/A 07/29/2017   Procedure: RADIOLOGY WITH ANESTHESIA MRI OF BRAIN WITH AND WITHOUT CONTRAST;  Surgeon: Radiologist, Medication, MD;  Location: [facility];  Service: Radiology;  Laterality: N/A;  . RADIOLOGY WITH ANESTHESIA N/A 12/07/2017   Procedure: MRI WITH ANESTHESIA OF BRAIN WITH AND WITHOUT CONTRAST;  Surgeon: Radiologist, Medication, MD;  Location: [facility];  Service: Radiology;  Laterality: N/A;  . RADIOLOGY WITH ANESTHESIA N/A 04/07/2018   Procedure: MRI OF BRAIN WITH AND WITHOUT CONTRAST;  Surgeon: Radiologist, Medication, MD;  Location: [facility];  Service: Radiology;  Laterality: N/A;  . RADIOLOGY WITH ANESTHESIA N/A 08/23/2018   Procedure: MRI WITH ANESTHESIA OF THE BRAIN WITH AND WITHOUT;  Surgeon: Radiologist, Medication, MD;  Location: [facility];  Service: Radiology;  Laterality: N/A;  . RADIOLOGY WITH ANESTHESIA  N/A 01/24/2019   Procedure: MRI OF BRAIN WITH AND WITHOUT CONTRAST;  Surgeon: Radiologist, Medication, MD;  Location: [facility];  Service: Radiology;  Laterality: N/A;  . RADIOLOGY WITH ANESTHESIA N/A 07/06/2019   Procedure: MRI WITH ANESTHESIA OF BRAIN WITH AND WITHOUT CONTRAST;  Surgeon: Radiologist, Medication, MD;  Location: [facility];  Service: Radiology;  Laterality: N/A;  . RADIOLOGY WITH ANESTHESIA N/A 01/11/2020   Procedure: MRI WITH ANESTHESIA BRAIN WITH AND WITHOUT;  Surgeon: Radiologist, Medication, MD;  Location: [facility];  Service: Radiology;  Laterality: N/A;  . right power port placement Right     There were no vitals filed for this visit.  Subjective Assessment - 02/23/20 0840    Subjective  Patient denies any falls. Patient reports HEP compliance 4-5d/wk.    Pertinent History  Per [employer] lymphedema eval 07/2019:  Treated here in March of last year currently with a reid sleeve, day sleeve and compression pump. Lt mastectomy and ALND with reconstruction 07/08/15 for T4N2 stage IIIB IDC. Metastatic disease in the brain s/p craniotomy 08/14/16. Chemo completed in 2017 Trastuzumab and Pertuzumab, docetaxel. Radiation completed 2017. Stable bone lesions to the Rt scapula, Lt iliac crest, L4, Tspine. Also probable in the liver and lungs. Pt is also bipolar and a recovered drug and alchohol user.  Current treatment TDM-1, denosumab and anastrozole.    Limitations  Other (comment)    Patient Stated Goals  To get more stability back in legs                       [facility] Adult PT Treatment/Exercise - 02/23/20 0847      Transfers   Transfers  Sit to Stand;Stand to Sit    Sit to Stand  5: Supervision;Without upper extremity assist;From chair/3-in-1    Sit to Stand Details  Verbal cues for sequencing;Verbal cues for technique    Sit to Stand Details (indicate cue type and reason)  When fatigued and/or distracted requires cueing for safety with rollator (brakes)    Five time sit to  stand comments   --    Stand to Sit  5: Supervision;To chair/3-in-1;Without upper extremity assist    Stand to Sit Details (indicate cue type and reason)  Verbal cues for sequencing;Verbal cues for technique;Verbal cues for precautions/safety      Ambulation/Gait   Ambulation/Gait  Yes    Ambulation/Gait Assistance  4: Min guard;5: Supervision;4: Min assist   min A - see ambulation/gait assistance details, below   Ambulation/Gait Assistance Details  Requires cueing when distracted and/or fatigued to slow rollator. Utilized head turns and nods and dual tasking ("how many exit signs, physioballs, etc") during gait - requires min guard to min A especially when dual tasking    Ambulation Distance (Feet)  548 Feet   207 feet  Assistive device  Rollator    Gait Pattern  Step-through pattern;Decreased stride length;Shuffle;Narrow base of support;Poor foot clearance - left;Poor foot clearance - right    Ambulation Surface  Level;Indoor    Gait velocity  --    Stairs  --    Stairs Assistance  --    Stair Management Technique  --    Number of Stairs  --    Height of Stairs  --    Curb  --    Gait Comments  reviewed with patient importance of requesting assistance from transportation to move rollator up/down curb and ramp - she voiced understanding stating this is her routine practice (does not perform without assistance). This reminder is in written form as part of HEP folder, too.      Berg Balance Test   Sit to Stand  --    Standing Unsupported  --    Sitting with Back Unsupported but Feet Supported on Floor or Stool  --    Stand to Sit  --    Transfers  --    Standing Unsupported with Eyes Closed  --    Standing Ubsupported with Feet Together  --    From Standing, Reach Forward with Outstretched Arm  --    From Standing Position, Pick up Object from Floor  --    From Standing Position, Turn to Look Behind Over each Shoulder  --    Turn 360 Degrees  --    Standing Unsupported, Alternately  Place Feet on Step/Stool  --    Standing Unsupported, One Foot in Front  --    Standing on One Leg  --    Total Score  --      Timed Up and Go Test   TUG  --    Normal TUG (seconds)  --      Self-Care   Self-Care  --    Other Self-Care Comments   --      Knee/Hip Exercises: Seated   Other Seated Knee/Hip Exercises  --    Marching  --    Sit to [employer]  --         Therapeutic Exercises (complete review of HEP)  Sit to Stand - 2 sets - 10 reps Seated March - 1-2 sets - 10 reps Seated Ankle Dorsiflexion with Resistance -  1-2 sets - 10 reps Standing Hip Abduction  - 1 sets - 10 reps Standing Hip Extension  - 1 sets - 10 reps Heel Toe Raises with Counter Support - 1 sets - 10 reps Alternating Step Taps with Counter Support - 1 sets - 10 reps      PT Education - 02/23/20 0940    Education Details  reviewed entire HEP (see updated patient instructions - former Medbridge access code didn't work - updated code & program in instructions) including walking program    Person(s) Educated  Patient    Methods  Explanation;Demonstration;Tactile cues;Verbal cues;Handout    Comprehension  Verbalized understanding;Returned demonstration;Need further instruction       PT Short Term Goals - 02/15/20 1346      PT SHORT TERM GOAL #2   Target Date  12/31/19      PT SHORT TERM GOAL #3   Target Date  12/31/19        PT Long Term Goals - 02/15/20 0914      PT LONG TERM GOAL #1   Title  Pt will be independent with progression of HEP and verbalize  plans for continued community fitness upon d/c from PT.  TARGET 03/13/2020    Baseline  02/15/2020-reviewed HEP, but pt is not consistently performing    Time  4    Period  Weeks    Status  On-going      PT LONG TERM GOAL #2   Title  Pt will improve Berg Balance score to at least 42/56 for decreased fall risk.    Baseline  12/27/19: 46/56 scored today; 02/15/2020 36/56    Time  4    Status  Revised      PT LONG TERM GOAL #3   Title  Pt  will improve 5x sit<>stand to less than or equal to 11 seconds for improved transfer safety and functional strength.    Baseline  5x sit<>stand 13.8 sec 02/15/2020    Time  4    Status  Revised      PT LONG TERM GOAL #4   Title  Pt will ambulate at least 1000 ft, indoor and outdoor surfaces, with supervision, for improved community gait.    Baseline  fatigues easily with indoor gait with rollator    Time  4    Period  Weeks    Status  Revised      PT LONG TERM GOAL #5   Title  --    Baseline  --    Time  --    Period  --    Status  --            Plan - 02/23/20 0941    Clinical Impression Statement  Today's session focused on complete HEP (including walking and standing program at home) review. Patient's compliance with HEP is reported to be consistent, but based on fatigue demonstrated to date, PT modified number of sets to make her safer at home. Tolerated HEP review well, but requires intermittent seated rest breaks due to fatigue. Will benefit from continued skilled PT to maximize functional outcomes and reduce fall risk.    Personal Factors and Comorbidities  Comorbidity 3+    Comorbidities  See PMH (subjective); metastatic breast cancer, hx of bipolar and anxiety    Examination-Activity Limitations  Stairs   Curbs   Examination-Participation Restrictions  --   Work   Stability/Clinical Decision Making  Stable/Uncomplicated    Rehab Potential  Good    PT Frequency  2x / week    PT Duration  4 weeks   per recert [id number]   PT Treatment/Interventions  ADLs/Self Care Home Management;Neuromuscular re-education;Gait training;Stair training;Patient/family education;Therapeutic activities;Functional mobility training;Therapeutic exercise;Balance training;DME Instruction    PT Next Visit Plan  check for HEP compliance including walking program (in her home)??, continue gait training with rollator, outdoors gait and dual tasking, BLE functional strengthening and balance     Consulted and Agree with Plan of Care  Patient       Patient will benefit from skilled therapeutic intervention in order to improve the following deficits and impairments:  Abnormal gait, Difficulty walking, Decreased mobility, Decreased strength, Decreased balance  Visit Diagnosis: Other abnormalities of gait and mobility  Muscle weakness (generalized)  Unsteadiness on feet     Problem List Patient Active Problem List   Diagnosis Date Noted  . Brain metastases ([place]) 11/01/2019  . Thrombocytopenia ([place]) 08/09/2019  . Port-A-Cath in place 06/20/2019  . 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 S/P mastectomy, left 12/09/2018  . S/P breast reconstruction, left 12/09/2018  . S/P radiation therapy 12/09/2018  .  GERD (gastroesophageal reflux disease) 12/10/2017  . Edema 11/09/2017  . Sensorineural hearing loss (SNHL) of both ears 11/05/2017  . Vitamin D deficiency 01/18/2017  . Bipolar I disorder, most recent episode depressed ([place]) 12/08/2016  . Adjustment disorder with anxiety 12/06/2016  . History of cancer metastatic to brain 07/27/2016  . Iron deficiency anemia 06/26/2016  . Bone metastases ([place]) 06/03/2016  . Primary cancer of lower-inner quadrant of left female breast ([place]) 06/01/2016  . Tobacco abuse 07/22/2015  . Alcoholism in remission ([place]) 07/22/2015  . Malignant neoplasm of female breast ([place]) 07/01/2015  . Current smoker 03/28/2015  . Goals of care, counseling/discussion 03/28/2015  . Seasonal allergies 03/28/2015  . Anxiety state 02/28/2015  . Fibromyalgia 02/28/2015  . Family history of diabetes mellitus 02/28/2015  . H/O alcohol abuse     [place], Millisa Giarrusso, DPT  02/23/2020, 9:47 AM  [facility] [address] [place], [place], [address] Phone: [phone]   Fax:  [phone]  Name: Shakedra Igou MRN: [id number] Date of Birth: Jan 17, 1961

## 2020-02-27 ENCOUNTER — Other Ambulatory Visit: Payer: Self-pay

## 2020-02-27 ENCOUNTER — Ambulatory Visit
Admission: RE | Admit: 2020-02-27 | Discharge: 2020-02-27 | Disposition: A | Discharge status: Home / Self Care | Payer: Medicaid Other | Source: Ambulatory Visit | Attending: Oncology | Admitting: Oncology

## 2020-02-27 DIAGNOSIS — Z1231 Encounter for screening mammogram for malignant neoplasm of breast: Secondary | ICD-10-CM

## 2020-02-28 ENCOUNTER — Ambulatory Visit: Payer: Medicaid Other | Admitting: Physical Therapy

## 2020-02-28 NOTE — Progress Notes (Signed)
Pharmacist Chemotherapy Monitoring - Follow Up Assessment    I verify that I have reviewed each item in the below checklist:  . Regimen for the patient is scheduled for the appropriate day and plan matches scheduled date. [name] Appropriate non-routine labs are ordered dependent on drug ordered. . If applicable, additional medications reviewed and ordered per protocol based on lifetime cumulative doses and/or treatment regimen.   Plan for follow-up and/or issues identified: No . I-vent associated with next due treatment: No . MD and/or nursing notified: No  [name] K 02/28/2020 8:38 AM

## 2020-02-29 ENCOUNTER — Other Ambulatory Visit: Payer: Self-pay | Admitting: Family Medicine

## 2020-02-29 DIAGNOSIS — J302 Other seasonal allergic rhinitis: Secondary | ICD-10-CM

## 2020-03-04 ENCOUNTER — Ambulatory Visit: Payer: Medicaid Other | Admitting: Physical Therapy

## 2020-03-05 ENCOUNTER — Inpatient Hospital Stay (HOSPITAL_BASED_OUTPATIENT_CLINIC_OR_DEPARTMENT_OTHER): Payer: Medicaid Other | Admitting: Medical

## 2020-03-05 ENCOUNTER — Other Ambulatory Visit: Payer: Self-pay | Admitting: Medical

## 2020-03-05 ENCOUNTER — Other Ambulatory Visit: Payer: Self-pay | Admitting: Oncology

## 2020-03-05 ENCOUNTER — Inpatient Hospital Stay: Payer: Medicaid Other

## 2020-03-05 ENCOUNTER — Inpatient Hospital Stay: Payer: Medicaid Other | Attending: Medical

## 2020-03-05 ENCOUNTER — Other Ambulatory Visit: Payer: Self-pay

## 2020-03-05 VITALS — BP 105/67 | HR 86 | Temp 98.2°F | Resp 17

## 2020-03-05 DIAGNOSIS — C7951 Secondary malignant neoplasm of bone: Secondary | ICD-10-CM | POA: Diagnosis not present

## 2020-03-05 DIAGNOSIS — C50312 Malignant neoplasm of lower-inner quadrant of left female breast: Secondary | ICD-10-CM | POA: Diagnosis present

## 2020-03-05 DIAGNOSIS — C50119 Malignant neoplasm of central portion of unspecified female breast: Secondary | ICD-10-CM

## 2020-03-05 DIAGNOSIS — F411 Generalized anxiety disorder: Secondary | ICD-10-CM

## 2020-03-05 DIAGNOSIS — C7931 Secondary malignant neoplasm of brain: Secondary | ICD-10-CM | POA: Insufficient documentation

## 2020-03-05 DIAGNOSIS — Z95828 Presence of other vascular implants and grafts: Secondary | ICD-10-CM

## 2020-03-05 DIAGNOSIS — R21 Rash and other nonspecific skin eruption: Secondary | ICD-10-CM

## 2020-03-05 DIAGNOSIS — Z5112 Encounter for antineoplastic immunotherapy: Secondary | ICD-10-CM | POA: Insufficient documentation

## 2020-03-05 DIAGNOSIS — C770 Secondary and unspecified malignant neoplasm of lymph nodes of head, face and neck: Secondary | ICD-10-CM

## 2020-03-05 DIAGNOSIS — D696 Thrombocytopenia, unspecified: Secondary | ICD-10-CM

## 2020-03-05 LAB — COMPREHENSIVE METABOLIC PANEL
ALT: 19 U/L (ref 0–44)
AST: 31 U/L (ref 15–41)
Albumin: 3.9 g/dL (ref 3.5–5.0)
Alkaline Phosphatase: 69 U/L (ref 38–126)
Anion gap: 10 (ref 5–15)
BUN: 11 mg/dL (ref 6–20)
CO2: 27 mmol/L (ref 22–32)
Calcium: 9.3 mg/dL (ref 8.9–10.3)
Chloride: 101 mmol/L (ref 98–111)
Creatinine, Ser: 0.95 mg/dL (ref 0.44–1.00)
GFR calc Af Amer: >60 mL/min (ref >60)
GFR calc non Af Amer: >60 mL/min (ref >60)
Glucose, Bld: 93 mg/dL (ref 70–99)
Potassium: 4.1 mmol/L (ref 3.5–5.1)
Sodium: 138 mmol/L (ref 135–145)
Total Bilirubin: 0.4 mg/dL (ref 0.3–1.2)
Total Protein: 6.8 g/dL (ref 6.5–8.1)

## 2020-03-05 LAB — CBC WITH DIFFERENTIAL/PLATELET
Abs Immature Granulocytes: 0.01 10*3/uL (ref 0.00–0.07)
Basophils Absolute: 0 10*3/uL (ref 0.0–0.1)
Basophils Relative: 1 %
Eosinophils Absolute: 0 10*3/uL (ref 0.0–0.5)
Eosinophils Relative: 0 %
HCT: 35.9 % — ABNORMAL LOW (ref 36.0–46.0)
Hemoglobin: 11.7 g/dL — ABNORMAL LOW (ref 12.0–15.0)
Immature Granulocytes: 0 %
Lymphocytes Relative: 22 %
Lymphs Abs: 0.6 10*3/uL — ABNORMAL LOW (ref 0.7–4.0)
MCH: 30.1 pg (ref 26.0–34.0)
MCHC: 32.6 g/dL (ref 30.0–36.0)
MCV: 92.3 fL (ref 80.0–100.0)
Monocytes Absolute: 0.3 10*3/uL (ref 0.1–1.0)
Monocytes Relative: 9 %
Neutro Abs: 2 10*3/uL (ref 1.7–7.7)
Neutrophils Relative %: 68 %
Platelets: 56 10*3/uL — ABNORMAL LOW (ref 150–400)
RBC: 3.89 MIL/uL (ref 3.87–5.11)
RDW: 15.5 % (ref 11.5–15.5)
WBC: 2.9 10*3/uL — ABNORMAL LOW (ref 4.0–10.5)
nRBC: 0 % (ref 0.0–0.2)

## 2020-03-05 MED ORDER — SODIUM CHLORIDE 0.9% FLUSH
10.0000 mL | INTRAVENOUS | Status: DC | PRN
  Administered 2020-03-05: 10 mL
  Filled 2020-03-05: qty 10

## 2020-03-05 MED ORDER — MUPIROCIN 2 % EX OINT: TOPICAL_OINTMENT | CUTANEOUS | 1 refills | Status: DC

## 2020-03-05 MED ORDER — DIPHENHYDRAMINE HCL 25 MG PO CAPS: 25.0000 mg | ORAL_CAPSULE | Freq: Once | ORAL | Status: DC

## 2020-03-05 MED ORDER — ACETAMINOPHEN 325 MG PO TABS
650.0000 mg | ORAL_TABLET | Freq: Once | ORAL | Status: AC
  Administered 2020-03-05: 650 mg via ORAL

## 2020-03-05 MED ORDER — DENOSUMAB 120 MG/1.7ML ~~LOC~~ SOLN
120.0000 mg | Freq: Once | SUBCUTANEOUS | Status: AC
  Administered 2020-03-05: 120 mg via SUBCUTANEOUS

## 2020-03-05 MED ORDER — HEPARIN SOD (PORK) LOCK FLUSH 100 UNIT/ML IV SOLN
500.0000 [IU] | Freq: Once | INTRAVENOUS | Status: AC | PRN
  Administered 2020-03-05: 500 [IU]
  Filled 2020-03-05: qty 5

## 2020-03-05 MED ORDER — ACETAMINOPHEN 325 MG PO TABS
ORAL_TABLET | ORAL | Status: AC
  Filled 2020-03-05: qty 2

## 2020-03-05 MED ORDER — DENOSUMAB 120 MG/1.7ML ~~LOC~~ SOLN
SUBCUTANEOUS | Status: AC
  Filled 2020-03-05: qty 1.7

## 2020-03-05 MED ORDER — TRASTUZUMAB-DKST CHEMO 150 MG IV SOLR
6.0000 mg/kg | Freq: Once | INTRAVENOUS | Status: AC
  Administered 2020-03-05: 399 mg via INTRAVENOUS
  Filled 2020-03-05: qty 19

## 2020-03-05 MED ORDER — SODIUM CHLORIDE 0.9 % IV SOLN
Freq: Once | INTRAVENOUS | Status: AC
  Filled 2020-03-05: qty 250

## 2020-03-05 NOTE — Progress Notes (Signed)
The patient was seen in the infusion room today as she was receiving treatment.  She reported having multiple small pustules and excoriations over her right breast.  She has had no change in activity.  She was given a prescription for Bactroban to use 3 times daily until the areas resolve.  Sandi Mealy, MHS, PA-C Physician Assistant

## 2020-03-05 NOTE — Patient Instructions (Signed)

## 2020-03-05 NOTE — Progress Notes (Signed)
Per Dr. [name], okay to treat without Benadryl 25mg  as patient has refused. Dr. [name] has also requested pharmacy omit from future treatments. RN will request pharmacy to complete.

## 2020-03-05 NOTE — Patient Instructions (Signed)
[facility] Discharge Instructions for Patients Receiving Chemotherapy  Today you received the following chemotherapy agents trastuzumab.  To help prevent nausea and vomiting after your treatment, we encourage you to take your nausea medication as directed.    If you develop nausea and vomiting that is not controlled by your nausea medication, call the clinic.   BELOW ARE SYMPTOMS THAT SHOULD BE REPORTED IMMEDIATELY:  *FEVER GREATER THAN 100.5 F  *CHILLS WITH OR WITHOUT FEVER  NAUSEA AND VOMITING THAT IS NOT CONTROLLED WITH YOUR NAUSEA MEDICATION  *UNUSUAL SHORTNESS OF BREATH  *UNUSUAL BRUISING OR BLEEDING  TENDERNESS IN MOUTH AND THROAT WITH OR WITHOUT PRESENCE OF ULCERS  *URINARY PROBLEMS  *BOWEL PROBLEMS  UNUSUAL RASH Items with * indicate a potential emergency and should be followed up as soon as possible.  Feel free to call the clinic should you have any questions or concerns. The clinic phone number is [phone].  Please show the CHEMO ALERT CARD at check-in to the Emergency Department and triage nurse.   

## 2020-03-07 ENCOUNTER — Encounter: Payer: Self-pay | Admitting: Physical Therapy

## 2020-03-07 ENCOUNTER — Ambulatory Visit: Payer: Medicaid Other | Attending: Adult Health | Admitting: Physical Therapy

## 2020-03-07 ENCOUNTER — Other Ambulatory Visit: Payer: Self-pay

## 2020-03-07 DIAGNOSIS — R2689 Other abnormalities of gait and mobility: Secondary | ICD-10-CM | POA: Diagnosis present

## 2020-03-07 DIAGNOSIS — R2681 Unsteadiness on feet: Secondary | ICD-10-CM | POA: Diagnosis present

## 2020-03-07 DIAGNOSIS — M6281 Muscle weakness (generalized): Secondary | ICD-10-CM | POA: Insufficient documentation

## 2020-03-07 NOTE — Therapy (Signed)
[facility] [address] [place], [place], [address] Phone: [phone]   Fax:  [phone]  Physical Therapy Treatment  Patient Details  Name: [name] MRN: [id number] Date of Birth: 09/07/1961 Referring Provider (PT): Wilber Bihari, NP   Encounter Date: 03/07/2020  PT End of Session - 03/07/20 1223    Visit Number  19    Number of Visits  24   per recert [id number]   Authorization Type  Medicaid (8 visits 02/15/2020-03/13/2020)    Authorization Time Period  2021 visits: 3 visits 12/07/19-12/27/19, 8 visits from 01/08/20- 02/04/20; 8 visits from 02/11/2020-03/13/2020    Authorization - Visit Number  3    Authorization - Number of Visits  8    PT Start Time  0939    PT Stop Time  1018    PT Time Calculation (min)  39 min    Equipment Utilized During Treatment  Gait belt    Activity Tolerance  Patient tolerated treatment well    Behavior During Therapy  [facility] for tasks assessed/performed   continues with decreased safety awareness      Past Medical History:  Diagnosis Date  . Alcohol abuse    none since 2013  . Anemia    during chemo  . Anxiety    At age 24  . Arthritis Dx 2010  . Bipolar disorder ([place])   . Bronchitis   . Cancer ([place])    breast mets to brain  . Chronic pain    resolved per patient 12/25/19  . Depression   . Family history of adverse reaction to anesthesia    MOther had PONV  . Fibromyalgia Dx 2005  . GERD (gastroesophageal reflux disease)   . Headache    hx  migraines  . Lymphedema of left arm   . Opiate dependence ([place])   . PONV (postoperative nausea and vomiting)   . Port-A-Cath in place   . PTSD (post-traumatic stress disorder)   . S/P endometrial ablation    in md's office    Past Surgical History:  Procedure Laterality Date  . APPLICATION OF CRANIAL NAVIGATION N/A 08/14/2016   Procedure: APPLICATION OF CRANIAL NAVIGATION;  Surgeon: Erline Levine, MD;  Location: [facility];   Service: Neurosurgery;  Laterality: N/A;  . BREAST RECONSTRUCTION Left    with silicone implant  . COLONOSCOPY W/ POLYPECTOMY    . CRANIOTOMY N/A 08/14/2016   Procedure: CRANIOTOMY TUMOR EXCISION WITH Lucky Rathke;  Surgeon: Erline Levine, MD;  Location: [facility];  Service: Neurosurgery;  Laterality: N/A;  . FIBULA FRACTURE SURGERY Left   . MASTECTOMY Left   . RADIOLOGY WITH ANESTHESIA N/A 07/23/2016   Procedure: MRI OF BRAIN WITH AND WITHOUT;  Surgeon: Medication Radiologist, MD;  Location: [facility];  Service: Radiology;  Laterality: N/A;  . RADIOLOGY WITH ANESTHESIA N/A 09/08/2016   Procedure: MRI OF BRAIN WITH AND WITHOUT CONTRAST;  Surgeon: Medication Radiologist, MD;  Location: [facility];  Service: Radiology;  Laterality: N/A;  . RADIOLOGY WITH ANESTHESIA N/A 12/10/2016   Procedure: MRI OF BRAIN WITH AND WITHOUT;  Surgeon: Medication Radiologist, MD;  Location: [facility];  Service: Radiology;  Laterality: N/A;  . RADIOLOGY WITH ANESTHESIA N/A 03/02/2017   Procedure: MRI of BRAIN W and W/OUT CONTRAST;  Surgeon: Medication Radiologist, MD;  Location: [facility];  Service: Radiology;  Laterality: N/A;  . RADIOLOGY WITH ANESTHESIA N/A 07/29/2017   Procedure: RADIOLOGY WITH ANESTHESIA MRI OF BRAIN WITH AND WITHOUT CONTRAST;  Surgeon: Radiologist,  Medication, MD;  Location: [facility];  Service: Radiology;  Laterality: N/A;  . RADIOLOGY WITH ANESTHESIA N/A 12/07/2017   Procedure: MRI WITH ANESTHESIA OF BRAIN WITH AND WITHOUT CONTRAST;  Surgeon: Radiologist, Medication, MD;  Location: [facility];  Service: Radiology;  Laterality: N/A;  . RADIOLOGY WITH ANESTHESIA N/A 04/07/2018   Procedure: MRI OF BRAIN WITH AND WITHOUT CONTRAST;  Surgeon: Radiologist, Medication, MD;  Location: [facility];  Service: Radiology;  Laterality: N/A;  . RADIOLOGY WITH ANESTHESIA N/A 08/23/2018   Procedure: MRI WITH ANESTHESIA OF THE BRAIN WITH AND WITHOUT;  Surgeon: Radiologist, Medication, MD;  Location: [facility];  Service: Radiology;  Laterality: N/A;  .  RADIOLOGY WITH ANESTHESIA N/A 01/24/2019   Procedure: MRI OF BRAIN WITH AND WITHOUT CONTRAST;  Surgeon: Radiologist, Medication, MD;  Location: [facility];  Service: Radiology;  Laterality: N/A;  . RADIOLOGY WITH ANESTHESIA N/A 07/06/2019   Procedure: MRI WITH ANESTHESIA OF BRAIN WITH AND WITHOUT CONTRAST;  Surgeon: Radiologist, Medication, MD;  Location: [facility];  Service: Radiology;  Laterality: N/A;  . RADIOLOGY WITH ANESTHESIA N/A 01/11/2020   Procedure: MRI WITH ANESTHESIA BRAIN WITH AND WITHOUT;  Surgeon: Radiologist, Medication, MD;  Location: [facility];  Service: Radiology;  Laterality: N/A;  . right power port placement Right     There were no vitals filed for this visit.  Subjective Assessment - 03/07/20 0943    Subjective  Pt wasn't feeling well last visit so she cancelled.  I've had people tell me that I'm walking better.    Pertinent History  Per [employer] lymphedema eval 07/2019:  Treated here in March of last year currently with a reid sleeve, day sleeve and compression pump. Lt mastectomy and ALND with reconstruction 07/08/15 for T4N2 stage IIIB IDC. Metastatic disease in the brain s/p craniotomy 08/14/16. Chemo completed in 2017 Trastuzumab and Pertuzumab, docetaxel. Radiation completed 2017. Stable bone lesions to the Rt scapula, Lt iliac crest, L4, Tspine. Also probable in the liver and lungs. Pt is also bipolar and a recovered drug and alchohol user.  Current treatment TDM-1, denosumab and anastrozole.    Limitations  Other (comment)    Patient Stated Goals  To get more stability back in legs    Currently in Pain?  No/denies                       [facility] Adult PT Treatment/Exercise - 03/07/20 0948      Transfers   Transfers  Sit to Stand;Stand to Sit    Sit to Stand  5: Supervision;Without upper extremity assist;From chair/3-in-1    Five time sit to stand comments   12.3    Stand to Sit  5: Supervision;To chair/3-in-1;Without upper extremity assist      Ambulation/Gait    Ambulation/Gait  Yes    Ambulation/Gait Assistance  5: Supervision;6: Modified independent (Device/Increase time)    Ambulation/Gait Assistance Details  Requires cues for rollator placement/use of brakes when negotiating curb outdoors    Ambulation Distance (Feet)  1200 Feet    Assistive device  Rollator    Gait Pattern  Step-through pattern    Ambulation Surface  Level;Indoor    Curb  5: Supervision   Cues for rollator placement and use of brakes   Curb Details (indicate cue type and reason)  Pt descends curb with rollator, with supervision and cues    Gait Comments  Additional gait indoors x 200 ft, using rollator with environmental scanning, slight veering noted, with supervision  Standardized Balance Assessment   Standardized Balance Assessment  Berg Balance Test      Berg Balance Test   Sit to Stand  Able to stand without using hands and stabilize independently    Standing Unsupported  Able to stand safely 2 minutes    Sitting with Back Unsupported but Feet Supported on Floor or Stool  Able to sit safely and securely 2 minutes    Stand to Sit  Sits safely with minimal use of hands    Transfers  Able to transfer safely, minor use of hands    Standing Unsupported with Eyes Closed  Able to stand 10 seconds safely    Standing Ubsupported with Feet Together  Able to place feet together independently but unable to hold for 30 seconds    From Standing, Reach Forward with Outstretched Arm  Can reach forward >12 cm safely (5")    From Standing Position, Pick up Object from Floor  Able to pick up shoe safely and easily    From Standing Position, Turn to Look Behind Over each Shoulder  Looks behind one side only/other side shows less weight shift    Turn 360 Degrees  Able to turn 360 degrees safely in 4 seconds or less    Standing Unsupported, Alternately Place Feet on Step/Stool  Able to complete 4 steps without aid or supervision    Standing Unsupported, One Foot in [place] to  place foot tandem independently and hold 30 seconds    Standing on One Leg  Able to lift leg independently and hold equal to or more than 3 seconds    Total Score  48           Self Care:  Importance of continueing HEP after d/c (next visit), verbally reviewed HEP.  Discussed improvements on Berg test, still at minimal fall risk.  PT Education - 03/07/20 1222    Education Details  Progress towards goals and POC; plans for d/c next visit    Person(s) Educated  Patient    Methods  Explanation    Comprehension  Verbalized understanding       PT Short Term Goals - 02/15/20 1346      PT SHORT TERM GOAL #2   Target Date  12/31/19      PT SHORT TERM GOAL #3   Target Date  12/31/19        PT Long Term Goals - 03/07/20 1001      PT LONG TERM GOAL #1   Title  Pt will be independent with progression of HEP and verbalize plans for continued community fitness upon d/c from PT.  TARGET 03/13/2020    Baseline  02/15/2020-reviewed HEP, but pt is not consistently performing    Time  4    Period  Weeks    Status  On-going      PT LONG TERM GOAL #2   Title  Pt will improve Berg Balance score to at least 42/56 for decreased fall risk.    Baseline  12/27/19: 46/56 scored today; 02/15/2020 36/56, 03/07/20 48/56    Time  4    Status  Achieved      PT LONG TERM GOAL #3   Title  Pt will improve 5x sit<>stand to less than or equal to 11 seconds for improved transfer safety and functional strength.    Baseline  5x sit<>stand 13.8 sec 02/15/2020, 03/07/20 12.3 secs    Time  4    Status  Revised  PT LONG TERM GOAL #4   Title  Pt will ambulate at least 1000 ft, indoor and outdoor surfaces, with supervision, for improved community gait.    Baseline  1200 ft mostly outdoor surface, supervision/mod I with rollator    Time  4    Period  Weeks    Status  Achieved            Plan - 03/07/20 1225    Clinical Impression Statement  Began assessing LTGs, with pt meeting LTG for Berg and for  long distance gait.  She has improved 5x sit<>stand, but just not yet to goal level.  She feels and she notes that friends are reporting that she is walking better.  She has improved Berg score to 48/56 (from previous score 36/56).  Pt appears to be appropriate for d/c from PT next visit.    Personal Factors and Comorbidities  Comorbidity 3+    Comorbidities  See PMH (subjective); metastatic breast cancer, hx of bipolar and anxiety    Examination-Activity Limitations  Stairs   Curbs   Examination-Participation Restrictions  --   Work   Stability/Clinical Decision Making  Stable/Uncomplicated    Rehab Potential  Good    PT Frequency  2x / week    PT Duration  4 weeks   per recert [id number]   PT Treatment/Interventions  ADLs/Self Care Home Management;Neuromuscular re-education;Gait training;Stair training;Patient/family education;Therapeutic activities;Functional mobility training;Therapeutic exercise;Balance training;DME Instruction    PT Next Visit Plan  Check HEP and remaining LTGs; discuss importance of continuing HEP and walking program; plan to d/c next visit    Consulted and Agree with Plan of Care  Patient       Patient will benefit from skilled therapeutic intervention in order to improve the following deficits and impairments:  Abnormal gait, Difficulty walking, Decreased mobility, Decreased strength, Decreased balance  Visit Diagnosis: Other abnormalities of gait and mobility  Unsteadiness on feet     Problem List Patient Active Problem List   Diagnosis Date Noted  . Brain metastases ([place]) 11/01/2019  . Thrombocytopenia ([place]) 08/09/2019  . Port-A-Cath in place 06/20/2019  . 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 S/P mastectomy, left 12/09/2018  . S/P breast reconstruction, left 12/09/2018  . S/P radiation therapy 12/09/2018  . GERD (gastroesophageal reflux disease) 12/10/2017  . Edema 11/09/2017  . Sensorineural hearing loss (SNHL) of  both ears 11/05/2017  . Vitamin D deficiency 01/18/2017  . Bipolar I disorder, most recent episode depressed ([place]) 12/08/2016  . Adjustment disorder with anxiety 12/06/2016  . History of cancer metastatic to brain 07/27/2016  . Iron deficiency anemia 06/26/2016  . Bone metastases ([place]) 06/03/2016  . Primary cancer of lower-inner quadrant of left female breast ([place]) 06/01/2016  . Tobacco abuse 07/22/2015  . Alcoholism in remission ([place]) 07/22/2015  . Malignant neoplasm of female breast ([place]) 07/01/2015  . Current smoker 03/28/2015  . Goals of care, counseling/discussion 03/28/2015  . Seasonal allergies 03/28/2015  . Anxiety state 02/28/2015  . Fibromyalgia 02/28/2015  . Family history of diabetes mellitus 02/28/2015  . H/O alcohol abuse     Latorya Bautch W. 03/07/2020, 12:30 PM Frazier Butt., PT  [facility] [address] [place], [place], [address] Phone: [phone]   Fax:  [phone]  Name: [name] MRN: [id number] Date of Birth: 09-16-1961

## 2020-03-11 ENCOUNTER — Ambulatory Visit (HOSPITAL_COMMUNITY): Payer: Medicaid Other | Attending: Internal Medicine

## 2020-03-11 ENCOUNTER — Other Ambulatory Visit: Payer: Self-pay

## 2020-03-11 DIAGNOSIS — C7931 Secondary malignant neoplasm of brain: Secondary | ICD-10-CM

## 2020-03-11 DIAGNOSIS — C50011 Malignant neoplasm of nipple and areola, right female breast: Secondary | ICD-10-CM | POA: Diagnosis present

## 2020-03-11 DIAGNOSIS — C770 Secondary and unspecified malignant neoplasm of lymph nodes of head, face and neck: Secondary | ICD-10-CM

## 2020-03-11 DIAGNOSIS — C7951 Secondary malignant neoplasm of bone: Secondary | ICD-10-CM | POA: Diagnosis present

## 2020-03-11 DIAGNOSIS — C50312 Malignant neoplasm of lower-inner quadrant of left female breast: Secondary | ICD-10-CM | POA: Diagnosis present

## 2020-03-11 DIAGNOSIS — C50012 Malignant neoplasm of nipple and areola, left female breast: Secondary | ICD-10-CM

## 2020-03-12 ENCOUNTER — Encounter: Payer: Self-pay | Admitting: Physical Therapy

## 2020-03-12 ENCOUNTER — Ambulatory Visit: Payer: Medicaid Other | Admitting: Physical Therapy

## 2020-03-12 DIAGNOSIS — M6281 Muscle weakness (generalized): Secondary | ICD-10-CM

## 2020-03-12 DIAGNOSIS — R2689 Other abnormalities of gait and mobility: Secondary | ICD-10-CM

## 2020-03-12 DIAGNOSIS — R2681 Unsteadiness on feet: Secondary | ICD-10-CM

## 2020-03-12 NOTE — Patient Instructions (Signed)
Access Code: [email or web address] URL: [email or web address] Date: 02/23/2020 Prepared by: Willow Ora  Exercises Sit to Stand - 1 x daily - 7 x weekly - 2 sets - 10 reps Seated March - 1 x daily - 7 x weekly - 1-2 sets - 10 reps Seated Ankle Dorsiflexion with Resistance - 1 x daily - 7 x weekly - 1-2 sets - 10 reps Standing Hip Abduction - 1 x daily - 7 x weekly - 1 sets - 10 reps Standing Hip Extension - 1 x daily - 7 x weekly - 1 sets - 10 reps Heel Toe Raises with Counter Support - 1 x daily - 7 x weekly - 1 sets - 10 reps Alternating Step Taps with Counter Support - 1 x daily - 7 x weekly - 1 sets - 10 reps

## 2020-03-12 NOTE — Therapy (Signed)
[facility] [address] [place], [place], [address] Phone: [phone]   Fax:  [phone]  Physical Therapy Treatment  Patient Details  Name: [name] MRN: [id number] Date of Birth: 04/25/1961 Referring Provider (PT): Wilber Bihari, NP   Encounter Date: 03/12/2020  PT End of Session - 03/12/20 0851    Visit Number  20    Number of Visits  24   per recert 5/97/4163   Authorization Type  Medicaid (8 visits 02/15/2020-03/13/2020)    Authorization Time Period  2021 visits: 3 visits 12/07/19-12/27/19, 8 visits from 01/08/20- 02/04/20; 8 visits from 02/11/2020-03/13/2020    Authorization - Visit Number  4    Authorization - Number of Visits  8    PT Start Time  0848    PT Stop Time  8453   discharge visit, not all time was needed   PT Time Calculation (min)  28 min    Equipment Utilized During Treatment  Gait belt    Activity Tolerance  Patient tolerated treatment well    Behavior During Therapy  [facility] for tasks assessed/performed   continues with decreased safety awareness      Past Medical History:  Diagnosis Date  . Alcohol abuse    none since 2013  . Anemia    during chemo  . Anxiety    At age 63  . Arthritis Dx 2010  . Bipolar disorder ([place])   . Bronchitis   . Cancer ([place])    breast mets to brain  . Chronic pain    resolved per patient 12/25/19  . Depression   . Family history of adverse reaction to anesthesia    MOther had PONV  . Fibromyalgia Dx 2005  . GERD (gastroesophageal reflux disease)   . Headache    hx  migraines  . Lymphedema of left arm   . Opiate dependence ([place])   . PONV (postoperative nausea and vomiting)   . Port-A-Cath in place   . PTSD (post-traumatic stress disorder)   . S/P endometrial ablation    in md's office    Past Surgical History:  Procedure Laterality Date  . APPLICATION OF CRANIAL NAVIGATION N/A 08/14/2016   Procedure: APPLICATION OF CRANIAL NAVIGATION;  Surgeon: Erline Levine, MD;  Location: [facility];  Service: Neurosurgery;  Laterality: N/A;  . BREAST RECONSTRUCTION Left    with silicone implant  . COLONOSCOPY W/ POLYPECTOMY    . CRANIOTOMY N/A 08/14/2016   Procedure: CRANIOTOMY TUMOR EXCISION WITH Lucky Rathke;  Surgeon: Erline Levine, MD;  Location: [facility];  Service: Neurosurgery;  Laterality: N/A;  . FIBULA FRACTURE SURGERY Left   . MASTECTOMY Left   . RADIOLOGY WITH ANESTHESIA N/A 07/23/2016   Procedure: MRI OF BRAIN WITH AND WITHOUT;  Surgeon: Medication Radiologist, MD;  Location: [facility];  Service: Radiology;  Laterality: N/A;  . RADIOLOGY WITH ANESTHESIA N/A 09/08/2016   Procedure: MRI OF BRAIN WITH AND WITHOUT CONTRAST;  Surgeon: Medication Radiologist, MD;  Location: [facility];  Service: Radiology;  Laterality: N/A;  . RADIOLOGY WITH ANESTHESIA N/A 12/10/2016   Procedure: MRI OF BRAIN WITH AND WITHOUT;  Surgeon: Medication Radiologist, MD;  Location: [facility];  Service: Radiology;  Laterality: N/A;  . RADIOLOGY WITH ANESTHESIA N/A 03/02/2017   Procedure: MRI of BRAIN W and W/OUT CONTRAST;  Surgeon: Medication Radiologist, MD;  Location: [facility];  Service: Radiology;  Laterality: N/A;  . RADIOLOGY WITH ANESTHESIA N/A 07/29/2017   Procedure: RADIOLOGY WITH ANESTHESIA MRI OF  BRAIN WITH AND WITHOUT CONTRAST;  Surgeon: Radiologist, Medication, MD;  Location: [facility];  Service: Radiology;  Laterality: N/A;  . RADIOLOGY WITH ANESTHESIA N/A 12/07/2017   Procedure: MRI WITH ANESTHESIA OF BRAIN WITH AND WITHOUT CONTRAST;  Surgeon: Radiologist, Medication, MD;  Location: [facility];  Service: Radiology;  Laterality: N/A;  . RADIOLOGY WITH ANESTHESIA N/A 04/07/2018   Procedure: MRI OF BRAIN WITH AND WITHOUT CONTRAST;  Surgeon: Radiologist, Medication, MD;  Location: [facility];  Service: Radiology;  Laterality: N/A;  . RADIOLOGY WITH ANESTHESIA N/A 08/23/2018   Procedure: MRI WITH ANESTHESIA OF THE BRAIN WITH AND WITHOUT;  Surgeon: Radiologist, Medication, MD;  Location: [facility];  Service:  Radiology;  Laterality: N/A;  . RADIOLOGY WITH ANESTHESIA N/A 01/24/2019   Procedure: MRI OF BRAIN WITH AND WITHOUT CONTRAST;  Surgeon: Radiologist, Medication, MD;  Location: [facility];  Service: Radiology;  Laterality: N/A;  . RADIOLOGY WITH ANESTHESIA N/A 07/06/2019   Procedure: MRI WITH ANESTHESIA OF BRAIN WITH AND WITHOUT CONTRAST;  Surgeon: Radiologist, Medication, MD;  Location: [facility];  Service: Radiology;  Laterality: N/A;  . RADIOLOGY WITH ANESTHESIA N/A 01/11/2020   Procedure: MRI WITH ANESTHESIA BRAIN WITH AND WITHOUT;  Surgeon: Radiologist, Medication, MD;  Location: [facility];  Service: Radiology;  Laterality: N/A;  . right power port placement Right     There were no vitals filed for this visit.  Subjective Assessment - 03/12/20 0850    Subjective  No new complaints. No falls or pain to report.    Pertinent History  Per [employer] lymphedema eval 07/2019:  Treated here in March of last year currently with a reid sleeve, day sleeve and compression pump. Lt mastectomy and ALND with reconstruction 07/08/15 for T4N2 stage IIIB IDC. Metastatic disease in the brain s/p craniotomy 08/14/16. Chemo completed in 2017 Trastuzumab and Pertuzumab, docetaxel. Radiation completed 2017. Stable bone lesions to the Rt scapula, Lt iliac crest, L4, Tspine. Also probable in the liver and lungs. Pt is also bipolar and a recovered drug and alchohol user.  Current treatment TDM-1, denosumab and anastrozole.    Limitations  Other (comment)    Patient Stated Goals  To get more stability back in legs    Currently in Pain?  No/denies    Pain Score  0-No pain          [facility] Adult PT Treatment/Exercise - 03/12/20 0852      Transfers   Transfers  Sit to Stand;Stand to Sit    Sit to Stand  5: Supervision;Without upper extremity assist;From chair/3-in-1;From bed    Five time sit to stand comments   10.50 no UE support from standard height chair    Stand to Sit  5: Supervision;To chair/3-in-1;Without upper extremity  assist;To bed      Ambulation/Gait   Ambulation/Gait  Yes    Ambulation/Gait Assistance  5: Supervision;6: Modified independent (Device/Increase time)    Ambulation/Gait Assistance Details  mod I on indoor level surfaces. supervision with outdoor uneven surfaces, especially on declines with cues to slow rollator down with brakes.     Ambulation Distance (Feet)  1000 Feet    Assistive device  Rollator    Gait Pattern  Step-through pattern    Ambulation Surface  Level;Unlevel;Indoor;Outdoor;Paved      Self-Care   Self-Care  Other Self-Care Comments    Other Self-Care Comments   Reinforced importance of continued exercies to maintain gains made in therapy. Pt is to continue with her HEP and walking program. Pt verbalized  understanding.       Exercises   Exercises  Other Exercises    Other Exercises   reviewed all ex's on current [facility] program with pt performing each for 10 reps, cues on form/technique.           PT Education - 03/12/20 0958    Education Details  progress toward remaining LTGs. Importance of continuing her HEP and walking program.    Person(s) Educated  Patient    Methods  Explanation;Demonstration    Comprehension  Verbalized understanding;Returned demonstration       PT Short Term Goals - 03/12/20 0953      PT SHORT TERM GOAL #1   Title  Pt will be independent with progression of HEP to address balance, strength, and gait.  TARGET 3 weeks (12/31/2019)    Baseline  12/27/19: doing current program a couple days a week without issues per pt report. will need to be updated as pt progresses    Status  Achieved    Target Date  12/31/19      PT SHORT TERM GOAL #2   Title  Pt will improve 5x sit<>stand test to less than or equal to 11.5 seconds to demonstrate improved lower extremity functional strength.    Baseline  12/27/19: 10.68 sec's with intermittent UE support from standard height chair    Status  Achieved    Target Date  12/31/19      PT SHORT TERM GOAL  #3   Title  Pt will stand for at least 10 minutes during PT sessions, without sitting breaks, for improved participation in ADLs and household tasks.    Baseline  12/27/19: pt able to participate in standing gait/actvities for 6-7 minutes before needing a seated rest break, improved just not to goal    Status  Partially Met    Target Date  12/31/19        PT Long Term Goals - 03/12/20 0954      PT LONG TERM GOAL #1   Title  Pt will be independent with progression of HEP and verbalize plans for continued community fitness upon d/c from PT.  TARGET 03/13/2020    Baseline  03/12/20: reviewed all with min cues for form/technique needed.    Period  Weeks    Status  Partially Met      PT LONG TERM GOAL #2   Title  Pt will improve Berg Balance score to at least 42/56 for decreased fall risk.    Baseline  12/27/19: 46/56 scored today; 02/15/2020 36/56;  03/07/20 48/56    Status  Achieved      PT LONG TERM GOAL #3   Title  Pt will improve 5x sit<>stand to less than or equal to 11 seconds for improved transfer safety and functional strength.    Baseline  5x sit<>stand 13.8 sec 02/15/2020; 03/07/20 12.3 secs; 03/12/20: 10.50 sec's    Status  Achieved      PT LONG TERM GOAL #4   Title  Pt will ambulate at least 1000 ft, indoor and outdoor surfaces, with supervision, for improved community gait.    Baseline  1200 ft mostly outdoor surface, supervision/mod I with rollator    Status  Achieved            Plan - 03/12/20 0851    Clinical Impression Statement  Today's skilled session focused on unmet LTGs with 5 time sit to stand goal met this session. HEP goal partially met as pt required some cues  on correct performance and does report inconsistency with performance. Pt is agreeable to discharge today without any questions.    Personal Factors and Comorbidities  Comorbidity 3+    Comorbidities  See PMH (subjective); metastatic breast cancer, hx of bipolar and anxiety    Examination-Activity  Limitations  Stairs   Curbs   Examination-Participation Restrictions  --   Work   Stability/Clinical Decision Making  Stable/Uncomplicated    Rehab Potential  Good    PT Frequency  2x / week    PT Duration  4 weeks   per recert 2/91/9166   PT Treatment/Interventions  ADLs/Self Care Home Management;Neuromuscular re-education;Gait training;Stair training;Patient/family education;Therapeutic activities;Functional mobility training;Therapeutic exercise;Balance training;DME Instruction    PT Next Visit Plan  discharge per PT plan of care    PT Home Exercise Plan  Access Code: [email or web address], plus a walking program    Consulted and Agree with Plan of Care  Patient       Patient will benefit from skilled therapeutic intervention in order to improve the following deficits and impairments:  Abnormal gait, Difficulty walking, Decreased mobility, Decreased strength, Decreased balance  Visit Diagnosis: Other abnormalities of gait and mobility  Unsteadiness on feet  Muscle weakness (generalized)     Problem List Patient Active Problem List   Diagnosis Date Noted  . Brain metastases ([place]) 11/01/2019  . Thrombocytopenia ([place]) 08/09/2019  . Port-A-Cath in place 06/20/2019  . 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 S/P mastectomy, left 12/09/2018  . S/P breast reconstruction, left 12/09/2018  . S/P radiation therapy 12/09/2018  . GERD (gastroesophageal reflux disease) 12/10/2017  . Edema 11/09/2017  . Sensorineural hearing loss (SNHL) of both ears 11/05/2017  . Vitamin D deficiency 01/18/2017  . Bipolar I disorder, most recent episode depressed ([place]) 12/08/2016  . Adjustment disorder with anxiety 12/06/2016  . History of cancer metastatic to brain 07/27/2016  . Iron deficiency anemia 06/26/2016  . Bone metastases ([place]) 06/03/2016  . Primary cancer of lower-inner quadrant of left female breast ([place]) 06/01/2016  . Tobacco abuse 07/22/2015  . Alcoholism in  remission ([place]) 07/22/2015  . Malignant neoplasm of female breast ([place]) 07/01/2015  . Current smoker 03/28/2015  . Goals of care, counseling/discussion 03/28/2015  . Seasonal allergies 03/28/2015  . Anxiety state 02/28/2015  . Fibromyalgia 02/28/2015  . Family history of diabetes mellitus 02/28/2015  . H/O alcohol abuse    [place], [place], [facility] [address] [place], [place] [address] [phone] 03/12/20, 10:00 AM   Name: [name] MRN: [id number] Date of Birth: [birth year]

## 2020-03-18 ENCOUNTER — Other Ambulatory Visit: Payer: Self-pay | Admitting: Oncology

## 2020-03-20 NOTE — Progress Notes (Signed)
Pharmacist Chemotherapy Monitoring - Follow Up Assessment    I verify that I have reviewed each item in the below checklist:  . Regimen for the patient is scheduled for the appropriate day and plan matches scheduled date. [name] Appropriate non-routine labs are ordered dependent on drug ordered. . If applicable, additional medications reviewed and ordered per protocol based on lifetime cumulative doses and/or treatment regimen.   Plan for follow-up and/or issues identified: No . I-vent associated with next due treatment: No . MD and/or nursing notified: No  Romualdo Bolk [facility] 03/20/2020 9:47 AM

## 2020-03-25 ENCOUNTER — Encounter: Payer: Self-pay | Admitting: Physical Therapy

## 2020-03-25 NOTE — Therapy (Signed)
[facility] [address] [place], [place], [address] Phone: [phone]   Fax:  [phone]  Patient Details  Name: [name] MRN: [id number] Date of Birth: Dec 01, 1960 Referring Provider:  No ref. provider found  Encounter Date: 03/25/2020   PHYSICAL THERAPY DISCHARGE SUMMARY  Visits from Start of Care: 20  Current functional level related to goals / functional outcomes: PT Long Term Goals - 03/12/20 0954      PT LONG TERM GOAL #1   Title  Pt will be independent with progression of HEP and verbalize plans for continued community fitness upon d/c from PT.  TARGET 03/13/2020    Baseline  03/12/20: reviewed all with min cues for form/technique needed.    Period  Weeks    Status  Partially Met      PT LONG TERM GOAL #2   Title  Pt will improve Berg Balance score to at least 42/56 for decreased fall risk.    Baseline  12/27/19: 46/56 scored today; 02/15/2020 36/56;  03/07/20 48/56    Status  Achieved      PT LONG TERM GOAL #3   Title  Pt will improve 5x sit<>stand to less than or equal to 11 seconds for improved transfer safety and functional strength.    Baseline  5x sit<>stand 13.8 sec 02/15/2020; 03/07/20 12.3 secs; 03/12/20: 10.50 sec's    Status  Achieved      PT LONG TERM GOAL #4   Title  Pt will ambulate at least 1000 ft, indoor and outdoor surfaces, with supervision, for improved community gait.    Baseline  1200 ft mostly outdoor surface, supervision/mod I with rollator    Status  Achieved      Pt has met 3 of 4 LTGs.   Remaining deficits: Decreased balance, decreased safety awareness with gait   Education / Equipment: Educated in fall prevention, HEP and progression of HEP, safety with gait using rollator  Plan: Patient agrees to discharge.  Patient goals were not met. Patient is being discharged due to                                                     ?????maximizing rehab potential at this time.        Robinette Esters W. 03/25/2020, 9:12 AM Mady Haagensen, PT 03/25/20 9:13 AM Phone: [phone] Fax: [facility] [address] [place], [place], [address] Phone: [phone]   Fax:  [phone]

## 2020-03-26 ENCOUNTER — Inpatient Hospital Stay: Payer: Medicaid Other

## 2020-03-26 ENCOUNTER — Inpatient Hospital Stay: Payer: Medicaid Other | Admitting: Adult Health

## 2020-03-26 ENCOUNTER — Other Ambulatory Visit: Payer: Self-pay | Admitting: Family Medicine

## 2020-03-27 NOTE — Progress Notes (Signed)
[place]  [phone]) [phone] [phone]) [phone]     ID: [name] DOB: [birth year]  MR#: [id number]  HCW#:[id number]  Patient Care Team: Charlott Rakes, MD as PCP - General (Family Medicine) Kyung Rudd, MD as Consulting Physician (Radiation Oncology) Erline Levine, MD as Consulting Physician (Neurosurgery) Corena Pilgrim, MD as Consulting Physician (Psychiatry) Mickeal Skinner Acey Lav, MD as Consulting Physician (Psychiatry) Nehemiah Settle, MD as Referring Physician (Plastic Surgery) Irene Limbo, MD as Consulting Physician (Plastic Surgery) Dillingham, Loel Lofty, DO as Attending Physician (Plastic Surgery)   CHIEF COMPLAINT: Metastatic triple positive breast cancer  CURRENT TREATMENT: trastuzumab [every 21 days], denosumab/Xgeva (Q6W), anastrozole   INTERVAL HISTORY: [name] returns today for follow up and treatment of her metastatic triple positive breast cancer.  She continues on trastuzumab, given every 21 days.  We switched back to this for maintenance after she took the TDM 1 for 8 months. Her most recent echocardiogram on 4/12/2021and showed an LVEF of 65-70%.  We normally repeat her echocardiograms every 6 months.  She is tolerating denosumab/Xgeva every 6 weeks with no side effects that she is aware of.    She also does generally well with anastrozole.  She has some vaginal dryness, and she is using some coconut oil regarding this.  Her most recent brain MRI was completed on 01/11/2020 revealing: new right cerebral subdural hematoma, measuring up to 8 mm, with no mass-like or nodular dural thickening is demonstrated; no evidence of recurrent metastatic disease.  She met with Dr. Mickeal Skinner on 02/19/2020 and was recommended repeat brain MRI and f/u in 4 months.  She would like to plan her CT and f/u at this time.  She underwent CT chest and head on 01/31/2020: consolidation likely infectious or inflammatory as well as ground glass opacity questionably related  to [place].  Her covid test was negative.    Bone scan was completed on 01/31/2020: no evidence of skeletal metastases.  Right breast screening mammogram completed on 02/27/2020 and showed no evidence of malignancy and breast density category C.     REVIEW OF SYSTEMS: Meggie tells me she is finally living at her own place, 1 bedroom 1 bathroom.  She is active with mowing yards.  She recently went down to [place] to visit her son and grandchildren.  She enjoyed this trip and got a new tattoo during this time period to commemorate her grandchildren.  She is doing well.    She does have an ear popping sensation and wants me to look in her ear.  She has taken sudafed, claritin, and flonase for this.  She denies any fever, chills, chest pain, palpitations, cough, bowel/bladder changes, nausea, vomiting, new pain, shortness of breath, or any other concerns.  A detailed ROS Was otherwise non contributory.    BREAST CANCER HISTORY: From the original intake note:  [name] was aware of a "lemon sized lump in" her left axilla for about a year before bringing it to medical attention. By then she had developed left breast and left axillary swelling (June 2016). She presented to the local emergency room and had a chest CT scan 06/06/2015 which showed a nodule in the left breast measuring 0.9 cm and questionable left axillary adenopathy. She then proceeded to bilateral diagnostic mammography and left breast ultrasonography 06/19/2015. There were no prior films for comparison (last mammography 12 years prior).. The breast density was category C. Mammography showed in the left breast upper inner quadrant a 7 cm area including a small mass and significant  pleomorphic calcifications. Ultrasonography defined the mass as measuring 1.2 cm. The left axilla appeared unremarkable. There was significant skin edema.  Biopsy of the left breast mass 06/19/2015 showed (SP [id number]) an invasive ductal carcinoma, grade 2, estrogen  receptor 83% positive, progesterone receptor 26% positive, and HER-2 amplified by immunohistochemstry with a 3+ reading. The patient had biopsies of a separate area in the left breast August of the same year and this showed atypical ductal hyperplasia. (SP [id number]).  Accordingly after appropriate discussion on 08/21/2015 the patient proceeded to left mastectomy with left axillary sentinel lymph node sampling, which, since the lymph nodes were positive, extended to the procedure to left axillary lymph node dissection. The pathology (SP [id number]) showed an invasive ductal carcinoma, grade 3, measuring in excess of 9 cm. There were also skin satellites, not contiguous with the invasive carcinoma. Margins were clear and ample. There was evidence of lymphovascular invasion. A total of 15 lymph nodes were removed, including 5 sentinel lymph nodes, all of which were positive, so that the final total was 14 out of 15 lymph nodes involved by tumor. There was evidence of extranodal extension. The final pathology was pT4b pN2a, stage IIIB  CA-27-29 and CEA 09/19/2015 were non-informative October 2016.  Unfortunately CT scans of the chest abdomen and pelvis 09/16/2015 showed bony metastases to the ri/ght scapula, left iliac crest, and also L4 and T-spine. There were questionable liver cysts which on repeat CT scan 03/02/2016 appear to be a little bit more well-defined, possibly a little larger. There were also some possible right upper lobe lung lesions.  Adjuvant treatment consisted of docetaxel, trastuzumab and pertuzumab, with the final (6th) docetaxel dose given 02/11/2016. She continues on trastuzumab and pertuzumab, with the 11th cycle given 05/05/2016. Echocardiogram 02/26/2016 showed an ejection fraction of 55%. She receives denosumab/Xgeva every 4 weeks.. She also receives radiation, started 06/09/201, to be completed 06/26/2016.  Her subsequent history is as detailed below   PAST MEDICAL HISTORY: Past  Medical History:  Diagnosis Date  . Alcohol abuse    none since 2013  . Anemia    during chemo  . Anxiety    At age 73  . Arthritis Dx 2010  . Bipolar disorder ([place])   . Bronchitis   . Cancer ([place])    breast mets to brain  . Chronic pain    resolved per patient 12/25/19  . Depression   . Family history of adverse reaction to anesthesia    MOther had PONV  . Fibromyalgia Dx 2005  . GERD (gastroesophageal reflux disease)   . Headache    hx  migraines  . Lymphedema of left arm   . Opiate dependence ([place])   . PONV (postoperative nausea and vomiting)   . Port-A-Cath in place   . PTSD (post-traumatic stress disorder)   . S/P endometrial ablation    in md's office    PAST SURGICAL HISTORY: Past Surgical History:  Procedure Laterality Date  . APPLICATION OF CRANIAL NAVIGATION N/A 08/14/2016   Procedure: APPLICATION OF CRANIAL NAVIGATION;  Surgeon: Erline Levine, MD;  Location: [facility];  Service: Neurosurgery;  Laterality: N/A;  . BREAST RECONSTRUCTION Left    with silicone implant  . COLONOSCOPY W/ POLYPECTOMY    . CRANIOTOMY N/A 08/14/2016   Procedure: CRANIOTOMY TUMOR EXCISION WITH Lucky Rathke;  Surgeon: Erline Levine, MD;  Location: [facility];  Service: Neurosurgery;  Laterality: N/A;  . FIBULA FRACTURE SURGERY Left   . MASTECTOMY Left   . RADIOLOGY WITH  ANESTHESIA N/A 07/23/2016   Procedure: MRI OF BRAIN WITH AND WITHOUT;  Surgeon: Medication Radiologist, MD;  Location: [facility];  Service: Radiology;  Laterality: N/A;  . RADIOLOGY WITH ANESTHESIA N/A 09/08/2016   Procedure: MRI OF BRAIN WITH AND WITHOUT CONTRAST;  Surgeon: Medication Radiologist, MD;  Location: [facility];  Service: Radiology;  Laterality: N/A;  . RADIOLOGY WITH ANESTHESIA N/A 12/10/2016   Procedure: MRI OF BRAIN WITH AND WITHOUT;  Surgeon: Medication Radiologist, MD;  Location: [facility];  Service: Radiology;  Laterality: N/A;  . RADIOLOGY WITH ANESTHESIA N/A 03/02/2017   Procedure: MRI of BRAIN W and W/OUT CONTRAST;   Surgeon: Medication Radiologist, MD;  Location: [facility];  Service: Radiology;  Laterality: N/A;  . RADIOLOGY WITH ANESTHESIA N/A 07/29/2017   Procedure: RADIOLOGY WITH ANESTHESIA MRI OF BRAIN WITH AND WITHOUT CONTRAST;  Surgeon: Radiologist, Medication, MD;  Location: [facility];  Service: Radiology;  Laterality: N/A;  . RADIOLOGY WITH ANESTHESIA N/A 12/07/2017   Procedure: MRI WITH ANESTHESIA OF BRAIN WITH AND WITHOUT CONTRAST;  Surgeon: Radiologist, Medication, MD;  Location: [facility];  Service: Radiology;  Laterality: N/A;  . RADIOLOGY WITH ANESTHESIA N/A 04/07/2018   Procedure: MRI OF BRAIN WITH AND WITHOUT CONTRAST;  Surgeon: Radiologist, Medication, MD;  Location: [facility];  Service: Radiology;  Laterality: N/A;  . RADIOLOGY WITH ANESTHESIA N/A 08/23/2018   Procedure: MRI WITH ANESTHESIA OF THE BRAIN WITH AND WITHOUT;  Surgeon: Radiologist, Medication, MD;  Location: [facility];  Service: Radiology;  Laterality: N/A;  . RADIOLOGY WITH ANESTHESIA N/A 01/24/2019   Procedure: MRI OF BRAIN WITH AND WITHOUT CONTRAST;  Surgeon: Radiologist, Medication, MD;  Location: [facility];  Service: Radiology;  Laterality: N/A;  . RADIOLOGY WITH ANESTHESIA N/A 07/06/2019   Procedure: MRI WITH ANESTHESIA OF BRAIN WITH AND WITHOUT CONTRAST;  Surgeon: Radiologist, Medication, MD;  Location: [facility];  Service: Radiology;  Laterality: N/A;  . RADIOLOGY WITH ANESTHESIA N/A 01/11/2020   Procedure: MRI WITH ANESTHESIA BRAIN WITH AND WITHOUT;  Surgeon: Radiologist, Medication, MD;  Location: [facility];  Service: Radiology;  Laterality: N/A;  . right power port placement Right     FAMILY HISTORY Family History  Problem Relation Age of Onset  . Diabetes Mother   . Bipolar disorder Mother   . CAD Father    The patient's father still living, age 58, in [place]. He had prostate cancer at some point in the past. The patient's mother died at age 33 from complications of diabetes. The patient had no brothers, 2 sisters. A paternal grandmother had  lung cancer in the setting of tobacco abuse. There is no other history of cancer in the family to her knowledge   GYNECOLOGIC HISTORY:  No LMP recorded. Patient has had an ablation. Menarche approximately age 32. First live birth in 42. The patient is GX P2. She underwent endometrial ablation in 2016.   SOCIAL HISTORY: Updated August 2019 The patient is originally from [place]. She has lived in [place] before but more recently was in [place]. She is back here because she could not afford her rent in [place]. She is divorced. Her 2 children are [name] who lives in [place] and works as a Development worker, community, and [name] who also lives in [place] and works as a Catering manager. The patient has a grandchild, Arelia Longest, 63 years old as of July 2019, living in [place] with his mother. The patient also has a grandson born in February 2019, who lives in [place]. The patient has not established herself with a  local church yet.    ADVANCED DIRECTIVES: Not in place; at the 06/03/2016 visit the patient was given the appropriate forms to complete and notarize at her discretion   HEALTH MAINTENANCE: Social History   Tobacco Use  . Smoking status: Current Every Day Smoker    Packs/day: 0.75    Years: 43.00    Pack years: 32.25    Types: Cigarettes  . Smokeless tobacco: Never Used  Substance Use Topics  . Alcohol use: No    Comment: no ETOH since 08/22/12  . Drug use: No    Comment: states she's in recovery program for 7 years     Colonoscopy:  PAP:  Bone density:   Allergies  Allergen Reactions  . Meperidine Nausea And Vomiting and Itching  . Demerol [Meperidine Hcl] Itching and Nausea And Vomiting    INTOLERANCE >  N & V  . Erythromycin Rash    Current Outpatient Medications on File Prior to Visit  Medication Sig Dispense Refill  . anastrozole (ARIMIDEX) 1 MG tablet TAKE 1 TABLET BY MOUTH EVERY DAY (Patient taking differently: Take 1 mg by mouth daily. ) 90 tablet 1  . Biotin 5  MG TABS Take 5 mg by mouth daily.     . Cholecalciferol (VITAMIN D3) 250 MCG (10000 UT) capsule Take 10,000 Units by mouth daily.     . cycloSPORINE (RESTASIS) 0.05 % ophthalmic emulsion Place 1 drop into both eyes 2 (two) times daily.    [name] docusate sodium (COLACE) 100 MG capsule Take 100 mg by mouth daily.     . fluticasone (FLONASE) 50 MCG/ACT nasal spray SPRAY 2 SPRAYS INTO EACH NOSTRIL EVERY DAY 16 mL 2  . gabapentin (NEURONTIN) 300 MG capsule Take 1-2 capsules (300-600 mg total) by mouth See admin instructions. Take 300 mg by mouth in the morning and take 600 mg by mouth at bedtime 90 capsule 0  . ibuprofen (ADVIL) 800 MG tablet TAKE 1 TABLET BY MOUTH THREE TIMES A DAY 90 tablet 0  . lamoTRIgine (LAMICTAL) 100 MG tablet Take 100 mg by mouth every morning.    . loratadine (CLARITIN) 10 MG tablet TAKE 1 TABLET BY MOUTH EVERY DAY (Patient taking differently: Take 10 mg by mouth daily. ) 30 tablet 6  . lurasidone (LATUDA) 40 MG TABS tablet Take by mouth.    . ondansetron (ZOFRAN) 8 MG tablet Take 1 tablet (8 mg total) by mouth every 8 (eight) hours as needed for nausea or vomiting. 90 tablet 1  . pantoprazole (PROTONIX) 40 MG tablet Take 1 tablet (40 mg total) by mouth daily. 90 tablet 1  . traZODone (DESYREL) 100 MG tablet Take 1 tablet (100 mg total) by mouth at bedtime. (Patient taking differently: Take 300 mg by mouth at bedtime. ) 30 tablet 0  . cyclobenzaprine (FLEXERIL) 10 MG tablet TAKE 1 TABLET BY MOUTH TWICE A DAY 60 tablet 1  . Lurasidone HCl (LATUDA) 60 MG TABS Take 60 mg by mouth daily.    . mupirocin ointment (BACTROBAN) 2 % Apply to effected areas TID until healed. 30 g 1   No current facility-administered medications on file prior to visit.    OBJECTIVE:  Vitals:   03/28/20 0831  BP: 126/73  Pulse: 81  Resp: 17  Temp: 98.7 F (37.1 C)  TempSrc: Temporal  SpO2: 100%  Weight: 139 lb 8 oz (63.3 kg)  Height: _0  (1.651 m)  Body mass index is 23.21 kg/m.  ECOG FS: 1 -  Symptomatic  but completely ambulatory  GENERAL: Patient is a well appearing female in no acute distress HEENT:  Sclerae anicteric.  Mask in place. Neck is supple. Bilateral TM normal, + light reflex, +BLM, no sign of infection NODES:  No cervical, supraclavicular, or axillary lymphadenopathy palpated.  BREAST EXAM:  Right breast benign, left breast s/p mastectomy and reconstruction, no sign of local recurrence.   LUNGS:  Clear to auscultation bilaterally.  No wheezes or rhonchi. HEART:  Regular rate and rhythm. No murmur appreciated. ABDOMEN:  Soft, nontender.  Positive, normoactive bowel sounds. No organomegaly palpated. MSK:  No focal spinal tenderness to palpation. Full range of motion bilaterally in the upper extremities. EXTREMITIES: + left arm lymphedema present and stable SKIN:  Clear with no obvious rashes or skin changes. No nail dyscrasia. NEURO:  Nonfocal. Well oriented.  Appropriate affect.    LAB RESULTS: No results found for: LABCA2  CBC    Component Value Date/Time   WBC 2.5 (L) 03/28/2020 0810   RBC 3.62 (L) 03/28/2020 0810   HGB 11.0 (L) 03/28/2020 0810   HGB 13.5 07/05/2019 1129   HGB 12.1 10/26/2017 1038   HCT 33.9 (L) 03/28/2020 0810   HCT 35.7 10/26/2017 1038   PLT 61 (L) 03/28/2020 0810   PLT 54 (L) 07/05/2019 1129   PLT 167 10/26/2017 1038   MCV 93.6 03/28/2020 0810   MCV 98.4 10/26/2017 1038   MCH 30.4 03/28/2020 0810   MCHC 32.4 03/28/2020 0810   RDW 14.8 03/28/2020 0810   RDW 13.3 10/26/2017 1038   LYMPHSABS 0.6 (L) 03/28/2020 0810   LYMPHSABS 0.5 (L) 10/26/2017 1038   MONOABS 0.2 03/28/2020 0810   MONOABS 0.1 10/26/2017 1038   EOSABS 0.0 03/28/2020 0810   EOSABS 0.0 10/26/2017 1038   BASOSABS 0.0 03/28/2020 0810   BASOSABS 0.0 10/26/2017 1038   CMP Latest Ref Rng & Units 03/28/2020 03/05/2020 02/13/2020  Glucose 70 - 99 mg/dL 93 93 92  BUN 6 - 20 mg/dL _0 Creatinine 0.44 - 1.00 mg/dL 0.86 0.95 0.88  Sodium 135 - 145 mmol/L 140 138 142    Potassium 3.5 - 5.1 mmol/L 3.8 4.1 4.2  Chloride 98 - 111 mmol/L 102 101 105  CO2 22 - 32 mmol/L _1 Calcium 8.9 - 10.3 mg/dL 9.2 9.3 9.6  Total Protein 6.5 - 8.1 g/dL 6.3(L) 6.8 6.6  Total Bilirubin 0.3 - 1.2 mg/dL 0.4 0.4 0.4  Alkaline Phos 38 - 126 U/L 75 69 76  AST 15 - 41 U/L _2 ALT 0 - 44 U/L _3 STUDIES: MM 3D SCREEN BREAST UNI RIGHT  Result Date: 02/27/2020 CLINICAL DATA:  Screening. EXAM: DIGITAL SCREENING UNILATERAL RIGHT MAMMOGRAM WITH CAD AND TOMO COMPARISON:  Previous exam(s). ACR Breast Density Category c: The breast tissue is heterogeneously dense, which may obscure small masses. FINDINGS: There are no findings suspicious for malignancy. Images were processed with CAD. IMPRESSION: No mammographic evidence of malignancy. A result letter of this screening mammogram will be mailed directly to the patient. RECOMMENDATION: Screening mammogram in one year. (Code:SM-B-01Y) BI-RADS CATEGORY  1: Negative. Electronically Signed   By: Abelardo Diesel M.D.   On: 02/27/2020 13:51   ECHOCARDIOGRAM COMPLETE  Result Date: 03/11/2020    ECHOCARDIOGRAM REPORT   Patient Name:   [name]  Date of Exam: 03/11/2020 Medical Rec #:  [id number]     Height:       65.0 in Accession #:  [id number]    Weight:       137.3 lb Date of Birth:  1961/07/26     BSA:          1.686 m Patient Age:    2 years      BP:           120/69 mmHg Patient Gender: F             HR:           89 bpm. Exam Location:  [facility] Procedure: 2D Echo, 3D Echo, Cardiac Doppler, Color Doppler and Strain Analysis Indications:    C50.312 Breast cancer  History:        Patient has prior history of Echocardiogram examinations, most                 recent 12/06/2019. Risk Factors:Current Smoker. Breast cancer s/p                 mastectomy and reconstruction. Brain and bone metastasis. ETOH.                 Anemia. Edema.  Sonographer:    Jessee Avers, RDCS Referring Phys: [place]  1.  Global longitudinal strain is -19.1%.  2. Left ventricular ejection fraction, by estimation, is 65 to 70%. The left ventricle has normal function. The left ventricle has no regional wall motion abnormalities. There is mild left ventricular hypertrophy. Left ventricular diastolic parameters were normal.  3. Right ventricular systolic function is normal. The right ventricular size is normal.  4. The mitral valve is abnormal. Trivial mitral valve regurgitation. Mild mitral stenosis.  5. The aortic valve is normal in structure. Aortic valve regurgitation is trivial. Comparison(s): No significant change from prior study. 12/06/19 EF 60-65%. FINDINGS  Left Ventricle: Left ventricular ejection fraction, by estimation, is 65 to 70%. The left ventricle has normal function. The left ventricle has no regional wall motion abnormalities. The left ventricular internal cavity size was normal in size. There is  mild left ventricular hypertrophy. Left ventricular diastolic parameters were normal. Right Ventricle: The right ventricular size is normal. No increase in right ventricular wall thickness. Right ventricular systolic function is normal. Left Atrium: Left atrial size was normal in size. Right Atrium: Right atrial size was normal in size. Pericardium: There is no evidence of pericardial effusion. Mitral Valve: The mitral valve is abnormal. Trivial mitral valve regurgitation. Mild mitral valve stenosis. Tricuspid Valve: The tricuspid valve is normal in structure. Tricuspid valve regurgitation is trivial. Aortic Valve: The aortic valve is normal in structure. Aortic valve regurgitation is trivial. Pulmonic Valve: The pulmonic valve was not well visualized. Pulmonic valve regurgitation is not visualized. Aorta: The aortic root is normal in size and structure. IAS/Shunts: No atrial level shunt detected by color flow Doppler. Additional Comments: Global longitudinal strain is -19.1%.  LEFT VENTRICLE PLAX 2D LVIDd:         4.00 cm   Diastology LVIDs:         2.30 cm  LV e' lateral:   11.00 cm/s LV PW:         1.00 cm  LV E/e' lateral: 4.9 LV IVS:        1.20 cm  LV e' medial:    6.90 cm/s LVOT diam:     1.90 cm  LV E/e' medial:  7.8 LV SV:         56 LV SV Index:   33  2D Longitudinal Strain LVOT Area:     2.84 cm 2D Strain GLS (A2C):   -18.3 %                         2D Strain GLS (A3C):   -17.2 %                         2D Strain GLS (A4C):   -21.9 %                         2D Strain GLS Avg:     -19.1 %                          3D Volume EF:                         3D EF:        55 %                         LV EDV:       94 ml                         LV ESV:       43 ml                         LV SV:        51 ml RIGHT VENTRICLE RV Basal diam:  2.90 cm RV S prime:     9.85 cm/s TAPSE (M-mode): 1.7 cm LEFT ATRIUM             Index       RIGHT ATRIUM           Index LA diam:        3.80 cm 2.25 cm/m  RA Pressure: 3.00 mmHg LA Vol (A2C):   21.6 ml 12.81 ml/m RA Area:     6.09 cm LA Vol (A4C):   19.3 ml 11.45 ml/m RA Volume:   9.26 ml   5.49 ml/m LA Biplane Vol: 20.4 ml 12.10 ml/m  AORTIC VALVE LVOT Vmax:   98.60 cm/s LVOT Vmean:  59.300 cm/s LVOT VTI:    0.199 m  AORTA Ao Root diam: 2.90 cm Ao Asc diam:  2.90 cm MITRAL VALVE               TRICUSPID VALVE                            Estimated RAP:  3.00 mmHg  MV E velocity: 53.60 cm/s  SHUNTS MV A velocity: 45.00 cm/s  Systemic VTI:  0.20 m MV E/A ratio:  1.19        Systemic Diam: 1.90 cm [name] MD Electronically signed by [name] MD Signature Date/Time: 03/11/2020/9:06:21 PM    Final     ELIGIBLE FOR AVAILABLE RESEARCH PROTOCOL: no  ASSESSMENT: 59 y.o. 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  (2) status post left mastectomy and axillary lymph node dissection  with immediate  expander placement 07/18/2015 for an mpT4 pN2,stage IIIB invasive ductal carcinoma, grade 3, with negative margins.  (a) definitive implant exchange to be scheduled in December   METASTATIC DISEASE: October 2016  (3) CT scan of the chest abdomen and pelvis  09/16/2015 shows metastatic lesions in the right scapula, left iliac crest, L4, and T spine. There were questionable liver cysts, with repeat CT scan 03/02/2016 showing possible right upper lobe lung lesions and possibly increased liver lesions  (a) CT scan of the chest 06/17/2016 shows no active disease in the lungs or liver  (b) Bone scan July 2017 showed no evidence of bony metastatic disease   (c) head CT 07/08/2016 showed a cerebellar lesion, confirmed by MRI 07/23/2016, status post craniotomy 08/14/2016, confirming a metastatic deposit which was estrogen and progesterone receptor negative, HER-2 amplified with a signals ratio of 7.16, number per cell 13.25  (d) CA 27-29 is not informative  (4) received docetaxel every 3 weeks 6 together with trastuzumab and pertuzumab, last docetaxel dose 02/11/2016  (5) adjuvant radiation7/03/2016 to 06/26/2016 at [facility]: 1. The Left chest wall was treated to 23.4 Gy in 13 fractions at 1.8 Gy per fraction. 2. The Left chest wall was boosted to 10 Gy in 5 fractions at 2 Gy per fraction. 3. The Left Sclav/PAB was treated to 23.4 Gy in 13 fractions at 1.8 Gy per fraction.  [Note: Including the patient's treatment in [facility] (received 15 fractions per Dr. Maryan Rued near [place], [place]), the patient received 50.4 Gy to the left chest wall and supraclavicular region. ]  (6) started trastuzumab and pertuzumab October 2016, continuing every 3 weeks,  (a) echocardiogram 02/26/2016 showed a well preserved ejection fraction  (b) echocardiogram 07/01/2016 shows an ejection fraction in the 60-65%   (c) pertuzumab discontinued 10/2016 with uncontrolled diarrhea  (d) echocardiogram 11/11/2016 showed an  ejection fraction in the 60-65%  (e) echocardiogram 03/03/2017 shows an ejection fraction of 60-65%  (f) echocardiogram on 05/19/2017 shows an ejection fraction of 55-60%  (g) echocardiogram 09/24/2017 shows the ejection fraction in the 60-65%  (h) echocardiogram 02/14/2018 shows an ejection fraction in the 60-65%  (I) echocardiogram  06/30/2018 shows an ejection fraction in the 55-60%  (m) echocardiogram on 12/08/2018 shows an ejection fraction in the 60-65% range  (7) started denosumab/Xgeva October 2017 given every 4 weeks, transitioned to every 8 weeks beginning 10/11/18 (every 6 weeks while giving TDM1 every 3 weeks)  (8) started anastrozole October 2017   (a) bone scan 11/10/2016 shows no active disease  (b) chest CT scan 11/10/2016 stable, with no evidence of active disease  (c) chest CT and bone scan 07/02/2017 show no evidence of active disease  (d) CT scan of the chest with contrast 11/10/2017 shows some left axillary edema, but no evidence of thrombosis or adenopathy in that area, 0.9 cm precarinal lymph and 0.7 cm right upper lobe nodule node; bone lesions stable  (e) CT of the chest 05/04/2018 shows a 1.4 cm right lower paratracheal node which is slightly increased and a new right prevascular mediastinal node measuring 0.7 cm.  Bone lesions are stable.  (f) chest CT on 07/01/2018 shows no definite findings of metastatic disease in the thorax. Previously noted borderline enlarged low right paratracheal lymph node is stable to slightly decreased in size   (e) chest CT on 12/30/2018 notes mild increase in right paratracheal adneopathy, recommended PET scan.  Pet scan on 01/19/2019 showed a hypermetabolic and enlarged right paratracheal lymph node consistent with breast cancer  recurrence.  (f)Trastuzumab discontinued due to February 2020 scans   (g) TDM-1 started on 01/31/2019 given every 21 days.  (h) Chest CT 05/15/2019 shows decrease in mediastinal adenopathy  (i) CT chest on 08/17/2019  shows resolution of thoracic adenopathy  (j) TDM 1 discontinued after 10/11/2019 dose (8 months treatment) because of thrombocytopenia  (k) trastuzumab resumed 11/01/2019, repeated every 21 days  (l) echocardiogram 12/06/2019 showed an ejection fraction in the 60-65% range  (m) chest CT scan, brain CT and bone scan 01/31/2020 show no evidence of active disease  (9) history of bipolar disorder  (a) currently on Lamictal and Latuda as well as Desiree L and Neurontin  (10) mild anemia with a significant drop in the MCV, ferritin 10 06/03/2016,   (a) Feraheme given 06/12/2016 and 06/18/2016  (11) tobacco abuse: Chantix started 06/18/2016--she is not currently trying to quit   (12) 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  (a) brain irradiation10/19/17-11/08/17: Whole brain/ 35 Gy in 14 fractions   (b) repeat brain MRI obtained 12/10/2016 shows no active disease in the brain  (c) repeat brain MRI 03/02/2017 shows no evidence of residual or recurrent disease  (d) repeat brain MRI 07/29/2017 shows no evidence of residual or recurrent disease  (e) repeat brain MRI 12/07/2017 shows no evidence of disease recurrence.  There is progressive white matter change secondary to prior treatment.  (f) repeat brain MRI 04/07/2018 showed no evidence of disease\  (g) repeat brain MRI on 08/23/2018 shows no evidence of disease  (h) repeat brain MRI on 01/25/2019 shows no evidence of disease  (I) brain MRI 07/06/2019 shows no evidence of active disease  (j) brain MRI 01/11/2020 shows a subdural hematoma measuring 0.8 cm but no evidence of recurrent metastatic disease   PLAN: [name] is doing well today.  She will continue with Trastuzumab every three weeks and is toelrating this well.  I reviewed her recent scans, along with her plans for repeating scans in June.  She will also repeat brain MRI and f/u with Dr. Mickeal Skinner at that time.  She will continue on Anastrozole daily and  tolerates this well.  She will continue using coconut oil for the vaginal dryness.  Her ears are fluttering likely secondary to eustachian tube dysfunction.  We will optimize her nasal spray and add astelin to her regimen.  She feels like she is losing her muscle mass.  I gave her information about our [employer] program and she will look into it.  She will need a compression sleeve and we can coordinate this when/if she decides to go.  She was recommended continued exercise.  Total encounter time 30 minutes.Wilber Bihari, NP 03/28/20 9:02 AM Medical Oncology and Hematology [facility] [place], [place] [address] Tel. [phone]    Fax. [phone]   *Total Encounter Time as defined by the Centers for Medicare and Medicaid Services includes, in addition to the face-to-face time of a patient visit (documented in the note above) non-face-to-face time: obtaining and reviewing outside history, ordering and reviewing medications, tests or procedures, care coordination (communications with other health care professionals or caregivers) and documentation in the medical record.

## 2020-03-28 ENCOUNTER — Inpatient Hospital Stay (HOSPITAL_BASED_OUTPATIENT_CLINIC_OR_DEPARTMENT_OTHER): Payer: Medicaid Other | Admitting: Adult Health

## 2020-03-28 ENCOUNTER — Inpatient Hospital Stay: Payer: Medicaid Other

## 2020-03-28 ENCOUNTER — Encounter: Payer: Self-pay | Admitting: Adult Health

## 2020-03-28 ENCOUNTER — Other Ambulatory Visit: Payer: Self-pay

## 2020-03-28 VITALS — BP 126/73 | HR 81 | Temp 98.7°F | Resp 17 | Ht 65.0 in | Wt 139.5 lb

## 2020-03-28 DIAGNOSIS — C50012 Malignant neoplasm of nipple and areola, left female breast: Secondary | ICD-10-CM | POA: Diagnosis not present

## 2020-03-28 DIAGNOSIS — Z95828 Presence of other vascular implants and grafts: Secondary | ICD-10-CM

## 2020-03-28 DIAGNOSIS — H6993 Unspecified Eustachian tube disorder, bilateral: Secondary | ICD-10-CM

## 2020-03-28 DIAGNOSIS — H6983 Other specified disorders of Eustachian tube, bilateral: Secondary | ICD-10-CM | POA: Diagnosis not present

## 2020-03-28 DIAGNOSIS — Z5112 Encounter for antineoplastic immunotherapy: Secondary | ICD-10-CM | POA: Diagnosis not present

## 2020-03-28 DIAGNOSIS — C50312 Malignant neoplasm of lower-inner quadrant of left female breast: Secondary | ICD-10-CM

## 2020-03-28 DIAGNOSIS — C7931 Secondary malignant neoplasm of brain: Secondary | ICD-10-CM | POA: Diagnosis not present

## 2020-03-28 DIAGNOSIS — C50011 Malignant neoplasm of nipple and areola, right female breast: Secondary | ICD-10-CM

## 2020-03-28 DIAGNOSIS — C50119 Malignant neoplasm of central portion of unspecified female breast: Secondary | ICD-10-CM

## 2020-03-28 DIAGNOSIS — D696 Thrombocytopenia, unspecified: Secondary | ICD-10-CM

## 2020-03-28 DIAGNOSIS — C770 Secondary and unspecified malignant neoplasm of lymph nodes of head, face and neck: Secondary | ICD-10-CM

## 2020-03-28 LAB — COMPREHENSIVE METABOLIC PANEL
ALT: 19 U/L (ref 0–44)
AST: 28 U/L (ref 15–41)
Albumin: 3.7 g/dL (ref 3.5–5.0)
Alkaline Phosphatase: 75 U/L (ref 38–126)
Anion gap: 8 (ref 5–15)
BUN: 10 mg/dL (ref 6–20)
CO2: 30 mmol/L (ref 22–32)
Calcium: 9.2 mg/dL (ref 8.9–10.3)
Chloride: 102 mmol/L (ref 98–111)
Creatinine, Ser: 0.86 mg/dL (ref 0.44–1.00)
GFR calc Af Amer: >60 mL/min (ref >60)
GFR calc non Af Amer: >60 mL/min (ref >60)
Glucose, Bld: 93 mg/dL (ref 70–99)
Potassium: 3.8 mmol/L (ref 3.5–5.1)
Sodium: 140 mmol/L (ref 135–145)
Total Bilirubin: 0.4 mg/dL (ref 0.3–1.2)
Total Protein: 6.3 g/dL — ABNORMAL LOW (ref 6.5–8.1)

## 2020-03-28 LAB — CBC WITH DIFFERENTIAL/PLATELET
Abs Immature Granulocytes: 0.01 10*3/uL (ref 0.00–0.07)
Basophils Absolute: 0 10*3/uL (ref 0.0–0.1)
Basophils Relative: 0 %
Eosinophils Absolute: 0 10*3/uL (ref 0.0–0.5)
Eosinophils Relative: 0 %
HCT: 33.9 % — ABNORMAL LOW (ref 36.0–46.0)
Hemoglobin: 11 g/dL — ABNORMAL LOW (ref 12.0–15.0)
Immature Granulocytes: 0 %
Lymphocytes Relative: 25 %
Lymphs Abs: 0.6 10*3/uL — ABNORMAL LOW (ref 0.7–4.0)
MCH: 30.4 pg (ref 26.0–34.0)
MCHC: 32.4 g/dL (ref 30.0–36.0)
MCV: 93.6 fL (ref 80.0–100.0)
Monocytes Absolute: 0.2 10*3/uL (ref 0.1–1.0)
Monocytes Relative: 8 %
Neutro Abs: 1.7 10*3/uL (ref 1.7–7.7)
Neutrophils Relative %: 67 %
Platelets: 61 10*3/uL — ABNORMAL LOW (ref 150–400)
RBC: 3.62 MIL/uL — ABNORMAL LOW (ref 3.87–5.11)
RDW: 14.8 % (ref 11.5–15.5)
WBC: 2.5 10*3/uL — ABNORMAL LOW (ref 4.0–10.5)
nRBC: 0 % (ref 0.0–0.2)

## 2020-03-28 MED ORDER — HEPARIN SOD (PORK) LOCK FLUSH 100 UNIT/ML IV SOLN
500.0000 [IU] | Freq: Once | INTRAVENOUS | Status: AC | PRN
  Administered 2020-03-28: 500 [IU]
  Filled 2020-03-28: qty 5

## 2020-03-28 MED ORDER — TRASTUZUMAB-DKST CHEMO 150 MG IV SOLR
6.0000 mg/kg | Freq: Once | INTRAVENOUS | Status: AC
  Administered 2020-03-28: 399 mg via INTRAVENOUS
  Filled 2020-03-28: qty 19

## 2020-03-28 MED ORDER — SODIUM CHLORIDE 0.9% FLUSH
10.0000 mL | INTRAVENOUS | Status: DC | PRN
  Administered 2020-03-28: 10 mL
  Filled 2020-03-28: qty 10

## 2020-03-28 MED ORDER — ACETAMINOPHEN 325 MG PO TABS
650.0000 mg | ORAL_TABLET | Freq: Once | ORAL | Status: AC
  Administered 2020-03-28: 650 mg via ORAL

## 2020-03-28 MED ORDER — SODIUM CHLORIDE 0.9% FLUSH
10.0000 mL | INTRAVENOUS | Status: DC | PRN
  Administered 2020-03-28: 10 mL via INTRAVENOUS
  Filled 2020-03-28: qty 10

## 2020-03-28 MED ORDER — ACETAMINOPHEN 325 MG PO TABS
ORAL_TABLET | ORAL | Status: AC
  Filled 2020-03-28: qty 2

## 2020-03-28 MED ORDER — AZELASTINE HCL 0.1 % NA SOLN: 2.0000 | Freq: Two times a day (BID) | NASAL | 12 refills | Status: DC

## 2020-03-28 MED ORDER — SODIUM CHLORIDE 0.9 % IV SOLN
Freq: Once | INTRAVENOUS | Status: AC
  Filled 2020-03-28: qty 250

## 2020-03-28 NOTE — Patient Instructions (Signed)
[facility] °Discharge Instructions for Patients Receiving Chemotherapy ° °Today you received the following chemotherapy agents Trastuzumab ° °To help prevent nausea and vomiting after your treatment, we encourage you to take your nausea medication as directed. °  °If you develop nausea and vomiting that is not controlled by your nausea medication, call the clinic.  ° °BELOW ARE SYMPTOMS THAT SHOULD BE REPORTED IMMEDIATELY: °· *FEVER GREATER THAN 100.5 F °· *CHILLS WITH OR WITHOUT FEVER °· NAUSEA AND VOMITING THAT IS NOT CONTROLLED WITH YOUR NAUSEA MEDICATION °· *UNUSUAL SHORTNESS OF BREATH °· *UNUSUAL BRUISING OR BLEEDING °· TENDERNESS IN MOUTH AND THROAT WITH OR WITHOUT PRESENCE OF ULCERS °· *URINARY PROBLEMS °· *BOWEL PROBLEMS °· UNUSUAL RASH °Items with * indicate a potential emergency and should be followed up as soon as possible. ° °Feel free to call the clinic should you have any questions or concerns. The clinic phone number is [phone]. ° °Please show the CHEMO ALERT CARD at check-in to the Emergency Department and triage nurse. ° ° °

## 2020-03-29 ENCOUNTER — Ambulatory Visit: Payer: Medicaid Other | Admitting: Family Medicine

## 2020-03-29 ENCOUNTER — Telehealth: Payer: Self-pay | Admitting: Adult Health

## 2020-03-29 NOTE — Telephone Encounter (Signed)
No 4/29 los. No changes made to pt's schedule.

## 2020-04-02 ENCOUNTER — Other Ambulatory Visit: Payer: Medicaid Other

## 2020-04-02 ENCOUNTER — Ambulatory Visit: Payer: Medicaid Other

## 2020-04-10 NOTE — Progress Notes (Signed)
Pharmacist Chemotherapy Monitoring - Follow Up Assessment    I verify that I have reviewed each item in the below checklist:  . Regimen for the patient is scheduled for the appropriate day and plan matches scheduled date. [name] Appropriate non-routine labs are ordered dependent on drug ordered. . If applicable, additional medications reviewed and ordered per protocol based on lifetime cumulative doses and/or treatment regimen.   Plan for follow-up and/or issues identified: No . I-vent associated with next due treatment: No . MD and/or nursing notified: No  [name] [facility] 04/10/2020 4:34 PM

## 2020-04-16 ENCOUNTER — Other Ambulatory Visit: Payer: Self-pay

## 2020-04-16 ENCOUNTER — Inpatient Hospital Stay: Payer: Medicaid Other

## 2020-04-16 ENCOUNTER — Inpatient Hospital Stay: Payer: Medicaid Other | Attending: Medical

## 2020-04-16 ENCOUNTER — Other Ambulatory Visit: Payer: Medicaid Other

## 2020-04-16 ENCOUNTER — Ambulatory Visit: Payer: Medicaid Other

## 2020-04-16 VITALS — BP 111/82 | HR 81 | Temp 98.9°F | Resp 18

## 2020-04-16 DIAGNOSIS — Z5112 Encounter for antineoplastic immunotherapy: Secondary | ICD-10-CM | POA: Insufficient documentation

## 2020-04-16 DIAGNOSIS — C50312 Malignant neoplasm of lower-inner quadrant of left female breast: Secondary | ICD-10-CM | POA: Insufficient documentation

## 2020-04-16 DIAGNOSIS — C770 Secondary and unspecified malignant neoplasm of lymph nodes of head, face and neck: Secondary | ICD-10-CM

## 2020-04-16 DIAGNOSIS — D696 Thrombocytopenia, unspecified: Secondary | ICD-10-CM

## 2020-04-16 DIAGNOSIS — C7951 Secondary malignant neoplasm of bone: Secondary | ICD-10-CM | POA: Diagnosis not present

## 2020-04-16 DIAGNOSIS — Z95828 Presence of other vascular implants and grafts: Secondary | ICD-10-CM

## 2020-04-16 DIAGNOSIS — F411 Generalized anxiety disorder: Secondary | ICD-10-CM

## 2020-04-16 DIAGNOSIS — C50119 Malignant neoplasm of central portion of unspecified female breast: Secondary | ICD-10-CM

## 2020-04-16 DIAGNOSIS — C7931 Secondary malignant neoplasm of brain: Secondary | ICD-10-CM | POA: Insufficient documentation

## 2020-04-16 LAB — CBC WITH DIFFERENTIAL/PLATELET
Abs Immature Granulocytes: 0.02 10*3/uL (ref 0.00–0.07)
Basophils Absolute: 0 10*3/uL (ref 0.0–0.1)
Basophils Relative: 0 %
Eosinophils Absolute: 0 10*3/uL (ref 0.0–0.5)
Eosinophils Relative: 0 %
HCT: 33.3 % — ABNORMAL LOW (ref 36.0–46.0)
Hemoglobin: 10.9 g/dL — ABNORMAL LOW (ref 12.0–15.0)
Immature Granulocytes: 1 %
Lymphocytes Relative: 18 %
Lymphs Abs: 0.6 10*3/uL — ABNORMAL LOW (ref 0.7–4.0)
MCH: 30.4 pg (ref 26.0–34.0)
MCHC: 32.7 g/dL (ref 30.0–36.0)
MCV: 93 fL (ref 80.0–100.0)
Monocytes Absolute: 0.3 10*3/uL (ref 0.1–1.0)
Monocytes Relative: 9 %
Neutro Abs: 2.2 10*3/uL (ref 1.7–7.7)
Neutrophils Relative %: 72 %
Platelets: 60 10*3/uL — ABNORMAL LOW (ref 150–400)
RBC: 3.58 MIL/uL — ABNORMAL LOW (ref 3.87–5.11)
RDW: 14 % (ref 11.5–15.5)
WBC: 3 10*3/uL — ABNORMAL LOW (ref 4.0–10.5)
nRBC: 0 % (ref 0.0–0.2)

## 2020-04-16 LAB — COMPREHENSIVE METABOLIC PANEL
ALT: 28 U/L (ref 0–44)
AST: 36 U/L (ref 15–41)
Albumin: 3.7 g/dL (ref 3.5–5.0)
Alkaline Phosphatase: 76 U/L (ref 38–126)
Anion gap: 11 (ref 5–15)
BUN: 10 mg/dL (ref 6–20)
CO2: 26 mmol/L (ref 22–32)
Calcium: 8.9 mg/dL (ref 8.9–10.3)
Chloride: 103 mmol/L (ref 98–111)
Creatinine, Ser: 0.94 mg/dL (ref 0.44–1.00)
GFR calc Af Amer: >60 mL/min (ref >60)
GFR calc non Af Amer: >60 mL/min (ref >60)
Glucose, Bld: 95 mg/dL (ref 70–99)
Potassium: 4.4 mmol/L (ref 3.5–5.1)
Sodium: 140 mmol/L (ref 135–145)
Total Bilirubin: 0.4 mg/dL (ref 0.3–1.2)
Total Protein: 6.6 g/dL (ref 6.5–8.1)

## 2020-04-16 MED ORDER — ACETAMINOPHEN 325 MG PO TABS
650.0000 mg | ORAL_TABLET | Freq: Once | ORAL | Status: AC
  Administered 2020-04-16: 650 mg via ORAL

## 2020-04-16 MED ORDER — DENOSUMAB 120 MG/1.7ML ~~LOC~~ SOLN
120.0000 mg | Freq: Once | SUBCUTANEOUS | Status: AC
  Administered 2020-04-16: 120 mg via SUBCUTANEOUS

## 2020-04-16 MED ORDER — SODIUM CHLORIDE 0.9 % IV SOLN
Freq: Once | INTRAVENOUS | Status: AC
  Filled 2020-04-16: qty 250

## 2020-04-16 MED ORDER — SODIUM CHLORIDE 0.9% FLUSH
10.0000 mL | INTRAVENOUS | Status: DC | PRN
  Administered 2020-04-16: 10 mL
  Filled 2020-04-16: qty 10

## 2020-04-16 MED ORDER — TRASTUZUMAB-DKST CHEMO 150 MG IV SOLR
6.0000 mg/kg | Freq: Once | INTRAVENOUS | Status: AC
  Administered 2020-04-16: 399 mg via INTRAVENOUS
  Filled 2020-04-16: qty 19

## 2020-04-16 MED ORDER — HEPARIN SOD (PORK) LOCK FLUSH 100 UNIT/ML IV SOLN
500.0000 [IU] | Freq: Once | INTRAVENOUS | Status: AC | PRN
  Administered 2020-04-16: 500 [IU]
  Filled 2020-04-16: qty 5

## 2020-04-16 MED ORDER — ACETAMINOPHEN 325 MG PO TABS
ORAL_TABLET | ORAL | Status: AC
  Filled 2020-04-16: qty 2

## 2020-04-16 MED ORDER — DENOSUMAB 120 MG/1.7ML ~~LOC~~ SOLN
SUBCUTANEOUS | Status: AC
  Filled 2020-04-16: qty 1.7

## 2020-04-16 NOTE — Patient Instructions (Signed)
[facility] °Discharge Instructions for Patients Receiving Chemotherapy ° °Today you received the following chemotherapy agents Trastuzumab ° °To help prevent nausea and vomiting after your treatment, we encourage you to take your nausea medication as directed. °  °If you develop nausea and vomiting that is not controlled by your nausea medication, call the clinic.  ° °BELOW ARE SYMPTOMS THAT SHOULD BE REPORTED IMMEDIATELY: °· *FEVER GREATER THAN 100.5 F °· *CHILLS WITH OR WITHOUT FEVER °· NAUSEA AND VOMITING THAT IS NOT CONTROLLED WITH YOUR NAUSEA MEDICATION °· *UNUSUAL SHORTNESS OF BREATH °· *UNUSUAL BRUISING OR BLEEDING °· TENDERNESS IN MOUTH AND THROAT WITH OR WITHOUT PRESENCE OF ULCERS °· *URINARY PROBLEMS °· *BOWEL PROBLEMS °· UNUSUAL RASH °Items with * indicate a potential emergency and should be followed up as soon as possible. ° °Feel free to call the clinic should you have any questions or concerns. The clinic phone number is [phone]. ° °Please show the CHEMO ALERT CARD at check-in to the Emergency Department and triage nurse. ° ° °

## 2020-04-16 NOTE — Progress Notes (Signed)
Pt w/ pending dental extractions; pt states possibly in June/July. I reviewed w/ Dr. [name] and he would like pt to get Xgeva today pending CMET results.  Kennith Center, Pharm.D., CPP 04/16/2020@9 :15 AM

## 2020-04-27 ENCOUNTER — Other Ambulatory Visit: Payer: Self-pay | Admitting: Oncology

## 2020-04-30 ENCOUNTER — Other Ambulatory Visit: Payer: Self-pay | Admitting: Radiation Therapy

## 2020-05-03 ENCOUNTER — Ambulatory Visit (HOSPITAL_COMMUNITY): Admission: RE | Admit: 2020-05-03 | Payer: Medicaid Other | Source: Ambulatory Visit

## 2020-05-03 ENCOUNTER — Ambulatory Visit (HOSPITAL_COMMUNITY): Payer: Medicaid Other

## 2020-05-07 ENCOUNTER — Other Ambulatory Visit: Payer: Medicaid Other

## 2020-05-07 ENCOUNTER — Ambulatory Visit: Payer: Medicaid Other | Admitting: Oncology

## 2020-05-07 ENCOUNTER — Ambulatory Visit: Payer: Medicaid Other

## 2020-05-07 NOTE — Progress Notes (Signed)
[facility]  [phone] [phone]     ID: [name] DOB: Jun 03, 1961  MR#: [id number]  UTM#:[id number]  Patient Care Team: Charlott Rakes, MD as PCP - General (Family Medicine) Kyung Rudd, MD as Consulting Physician (Radiation Oncology) Erline Levine, MD as Consulting Physician (Neurosurgery) Shannon Pilgrim, MD as Consulting Physician (Psychiatry) Shannon Skinner Acey Lav, MD as Consulting Physician (Psychiatry) Nehemiah Settle, MD as Referring Physician (Plastic Surgery) Irene Limbo, MD as Consulting Physician (Plastic Surgery) Dillingham, Loel Lofty, DO as Attending Physician (Plastic Surgery)   CHIEF COMPLAINT: Metastatic triple positive breast cancer  CURRENT TREATMENT: trastuzumab [every 21 days], denosumab/Xgeva (Q6W), anastrozole   INTERVAL HISTORY: [name] returns today for follow up and treatment of her metastatic triple positive breast cancer.  She continues on trastuzumab, given every 21 days.  We switched back to this for maintenance after she took the TDM 1 for 8 months. Her most recent echocardiogram on 4/12/2021and showed an LVEF of 65-70%.  Her echocardiograms are repeated every 6 months.    She is tolerating denosumab/Xgeva every 6 weeks with no side effects that she is aware of.    She also does generally well with anastrozole.  She has some vaginal dryness and uses coconut oil for intercourse.      REVIEW OF SYSTEMS: [name] notes she is doing well today.  She says her appetite is good.  She and her family are doing well and her activity level is good.  She wants to know if/when she might be able to go back on TDM1 treatment.    She is constipated and has a bowel movement once every 4 days.  She is taking miralax every other day and still struggles with this.  She denies any fevers, chills, chest pain, palpitations, cough, shortness of breath, bladder changes, nausea, vomiting or any other concerns.  A detailed ROS Was  otherwise non contributory.   BREAST CANCER HISTORY: From the original intake note:  [name] was aware of a "lemon sized lump in" her left axilla for about a year before bringing it to medical attention. By then she had developed left breast and left axillary swelling (June 2016). She presented to the local emergency room and had a chest CT scan 06/06/2015 which showed a nodule in the left breast measuring 0.9 cm and questionable left axillary adenopathy. She then proceeded to bilateral diagnostic mammography and left breast ultrasonography 06/19/2015. There were no prior films for comparison (last mammography 12 years prior).. The breast density was category C. Mammography showed in the left breast upper inner quadrant a 7 cm area including a small mass and significant pleomorphic calcifications. Ultrasonography defined the mass as measuring 1.2 cm. The left axilla appeared unremarkable. There was significant skin edema.  Biopsy of the left breast mass 06/19/2015 showed (SP [id number]) an invasive ductal carcinoma, grade 2, estrogen receptor 83% positive, progesterone receptor 26% positive, and HER-2 amplified by immunohistochemstry with a 3+ reading. The patient had biopsies of a separate area in the left breast August of the same year and this showed atypical ductal hyperplasia. (SP [id number]).  Accordingly after appropriate discussion on 08/21/2015 the patient proceeded to left mastectomy with left axillary sentinel lymph node sampling, which, since the lymph nodes were positive, extended to the procedure to left axillary lymph node dissection. The pathology (SP [id number]) showed an invasive ductal carcinoma, grade 3, measuring in excess of 9 cm. There were also skin satellites, not contiguous with the invasive carcinoma. Margins were clear and ample.  There was evidence of lymphovascular invasion. A total of 15 lymph nodes were removed, including 5 sentinel lymph nodes, all of which were positive, so that the  final total was 14 out of 15 lymph nodes involved by tumor. There was evidence of extranodal extension. The final pathology was pT4b pN2a, stage IIIB  CA-27-29 and CEA 09/19/2015 were non-informative October 2016.  Unfortunately CT scans of the chest abdomen and pelvis 09/16/2015 showed bony metastases to the ri/ght scapula, left iliac crest, and also L4 and T-spine. There were questionable liver cysts which on repeat CT scan 03/02/2016 appear to be a little bit more well-defined, possibly a little larger. There were also some possible right upper lobe lung lesions.  Adjuvant treatment consisted of docetaxel, trastuzumab and pertuzumab, with the final (6th) docetaxel dose given 02/11/2016. She continues on trastuzumab and pertuzumab, with the 11th cycle given 05/05/2016. Echocardiogram 02/26/2016 showed an ejection fraction of 55%. She receives denosumab/Xgeva every 4 weeks.. She also receives radiation, started 06/09/201, to be completed 06/26/2016.  Her subsequent history is as detailed below   PAST MEDICAL HISTORY: Past Medical History:  Diagnosis Date  . Alcohol abuse    none since 2013  . Anemia    during chemo  . Anxiety    At age 56  . Arthritis Dx 2010  . Bipolar disorder ([place])   . Bronchitis   . Cancer ([place])    breast mets to brain  . Chronic pain    resolved per patient 12/25/19  . Depression   . Family history of adverse reaction to anesthesia    MOther had PONV  . Fibromyalgia Dx 2005  . GERD (gastroesophageal reflux disease)   . Headache    hx  migraines  . Lymphedema of left arm   . Opiate dependence ([place])   . PONV (postoperative nausea and vomiting)   . Port-A-Cath in place   . PTSD (post-traumatic stress disorder)   . S/P endometrial ablation    in md's office    PAST SURGICAL HISTORY: Past Surgical History:  Procedure Laterality Date  . APPLICATION OF CRANIAL NAVIGATION N/A 08/14/2016   Procedure: APPLICATION OF CRANIAL NAVIGATION;  Surgeon: Erline Levine,  MD;  Location: [facility];  Service: Neurosurgery;  Laterality: N/A;  . BREAST RECONSTRUCTION Left    with silicone implant  . COLONOSCOPY W/ POLYPECTOMY    . CRANIOTOMY N/A 08/14/2016   Procedure: CRANIOTOMY TUMOR EXCISION WITH Lucky Rathke;  Surgeon: Erline Levine, MD;  Location: [facility];  Service: Neurosurgery;  Laterality: N/A;  . FIBULA FRACTURE SURGERY Left   . MASTECTOMY Left   . RADIOLOGY WITH ANESTHESIA N/A 07/23/2016   Procedure: MRI OF BRAIN WITH AND WITHOUT;  Surgeon: Medication Radiologist, MD;  Location: [facility];  Service: Radiology;  Laterality: N/A;  . RADIOLOGY WITH ANESTHESIA N/A 09/08/2016   Procedure: MRI OF BRAIN WITH AND WITHOUT CONTRAST;  Surgeon: Medication Radiologist, MD;  Location: [facility];  Service: Radiology;  Laterality: N/A;  . RADIOLOGY WITH ANESTHESIA N/A 12/10/2016   Procedure: MRI OF BRAIN WITH AND WITHOUT;  Surgeon: Medication Radiologist, MD;  Location: [facility];  Service: Radiology;  Laterality: N/A;  . RADIOLOGY WITH ANESTHESIA N/A 03/02/2017   Procedure: MRI of BRAIN W and W/OUT CONTRAST;  Surgeon: Medication Radiologist, MD;  Location: [facility];  Service: Radiology;  Laterality: N/A;  . RADIOLOGY WITH ANESTHESIA N/A 07/29/2017   Procedure: RADIOLOGY WITH ANESTHESIA MRI OF BRAIN WITH AND WITHOUT CONTRAST;  Surgeon: Radiologist, Medication, MD;  Location:  [facility];  Service: Radiology;  Laterality: N/A;  . RADIOLOGY WITH ANESTHESIA N/A 12/07/2017   Procedure: MRI WITH ANESTHESIA OF BRAIN WITH AND WITHOUT CONTRAST;  Surgeon: Radiologist, Medication, MD;  Location: [facility];  Service: Radiology;  Laterality: N/A;  . RADIOLOGY WITH ANESTHESIA N/A 04/07/2018   Procedure: MRI OF BRAIN WITH AND WITHOUT CONTRAST;  Surgeon: Radiologist, Medication, MD;  Location: [facility];  Service: Radiology;  Laterality: N/A;  . RADIOLOGY WITH ANESTHESIA N/A 08/23/2018   Procedure: MRI WITH ANESTHESIA OF THE BRAIN WITH AND WITHOUT;  Surgeon: Radiologist, Medication, MD;  Location: [facility];  Service:  Radiology;  Laterality: N/A;  . RADIOLOGY WITH ANESTHESIA N/A 01/24/2019   Procedure: MRI OF BRAIN WITH AND WITHOUT CONTRAST;  Surgeon: Radiologist, Medication, MD;  Location: [facility];  Service: Radiology;  Laterality: N/A;  . RADIOLOGY WITH ANESTHESIA N/A 07/06/2019   Procedure: MRI WITH ANESTHESIA OF BRAIN WITH AND WITHOUT CONTRAST;  Surgeon: Radiologist, Medication, MD;  Location: [facility];  Service: Radiology;  Laterality: N/A;  . RADIOLOGY WITH ANESTHESIA N/A 01/11/2020   Procedure: MRI WITH ANESTHESIA BRAIN WITH AND WITHOUT;  Surgeon: Radiologist, Medication, MD;  Location: [facility];  Service: Radiology;  Laterality: N/A;  . right power port placement Right     FAMILY HISTORY Family History  Problem Relation Age of Onset  . Diabetes Mother   . Bipolar disorder Mother   . CAD Father    The patient's father still living, age 27, in [place]. He had prostate cancer at some point in the past. The patient's mother died at age 37 from complications of diabetes. The patient had no brothers, 2 sisters. A paternal grandmother had lung cancer in the setting of tobacco abuse. There is no other history of cancer in the family to her knowledge   GYNECOLOGIC HISTORY:  No LMP recorded. Patient has had an ablation. Menarche approximately age 23. First live birth in 24. The patient is GX P2. She underwent endometrial ablation in 2016.   SOCIAL HISTORY: Updated August 2019 The patient is originally from [place]. She has lived in [place] before but more recently was in [place]. She is back here because she could not afford her rent in [place]. She is divorced. Her 2 children are [name] who lives in [place] and works as a Development worker, community, and [name] who also lives in [place] and works as a Catering manager. The patient has a grandchild, Arelia Longest, 90 years old as of July 2019, living in [place] with his mother. The patient also has a grandson born in February 2019, who lives in [place]. The patient  has not established herself with a local church yet.    ADVANCED DIRECTIVES: Not in place; at the 06/03/2016 visit the patient was given the appropriate forms to complete and notarize at her discretion   HEALTH MAINTENANCE: Social History   Tobacco Use  . Smoking status: Current Every Day Smoker    Packs/day: 0.75    Years: 43.00    Pack years: 32.25    Types: Cigarettes  . Smokeless tobacco: Never Used  Substance Use Topics  . Alcohol use: No    Comment: no ETOH since 08/22/12  . Drug use: No    Comment: states she's in recovery program for 7 years     Colonoscopy:  PAP:  Bone density:   Allergies  Allergen Reactions  . Meperidine Nausea And Vomiting and Itching  . Demerol [Meperidine Hcl] Itching and Nausea And Vomiting    INTOLERANCE >  N &  V  . Erythromycin Rash    Current Outpatient Medications on File Prior to Visit  Medication Sig Dispense Refill  . anastrozole (ARIMIDEX) 1 MG tablet TAKE 1 TABLET BY MOUTH EVERY DAY (Patient taking differently: Take 1 mg by mouth daily. ) 90 tablet 1  . azelastine (ASTELIN) 0.1 % nasal spray Place 2 sprays into both nostrils 2 (two) times daily. Use in each nostril as directed 30 mL 12  . Biotin 5 MG TABS Take 5 mg by mouth daily.     . Cholecalciferol (VITAMIN D3) 250 MCG (10000 UT) capsule Take 10,000 Units by mouth daily.     . cyclobenzaprine (FLEXERIL) 10 MG tablet TAKE 1 TABLET BY MOUTH TWICE A DAY 60 tablet 1  . cycloSPORINE (RESTASIS) 0.05 % ophthalmic emulsion Place 1 drop into both eyes 2 (two) times daily.    [name] docusate sodium (COLACE) 100 MG capsule Take 100 mg by mouth daily.     . fluticasone (FLONASE) 50 MCG/ACT nasal spray SPRAY 2 SPRAYS INTO EACH NOSTRIL EVERY DAY 16 mL 2  . gabapentin (NEURONTIN) 300 MG capsule Take 1-2 capsules (300-600 mg total) by mouth See admin instructions. Take 300 mg by mouth in the morning and take 600 mg by mouth at bedtime 90 capsule 0  . ibuprofen (ADVIL) 800 MG tablet TAKE 1 TABLET BY  MOUTH THREE TIMES A DAY 90 tablet 0  . lamoTRIgine (LAMICTAL) 100 MG tablet Take 100 mg by mouth every morning.    . loratadine (CLARITIN) 10 MG tablet TAKE 1 TABLET BY MOUTH EVERY DAY (Patient taking differently: Take 10 mg by mouth daily. ) 30 tablet 6  . lurasidone (LATUDA) 40 MG TABS tablet Take by mouth.    . Lurasidone HCl (LATUDA) 60 MG TABS Take 60 mg by mouth daily.    . mupirocin ointment (BACTROBAN) 2 % Apply to effected areas TID until healed. 30 g 1  . ondansetron (ZOFRAN) 8 MG tablet Take 1 tablet (8 mg total) by mouth every 8 (eight) hours as needed for nausea or vomiting. 90 tablet 1  . pantoprazole (PROTONIX) 40 MG tablet Take 1 tablet (40 mg total) by mouth daily. 90 tablet 1  . traZODone (DESYREL) 100 MG tablet Take 1 tablet (100 mg total) by mouth at bedtime. (Patient taking differently: Take 300 mg by mouth at bedtime. ) 30 tablet 0   No current facility-administered medications on file prior to visit.    OBJECTIVE:  Vitals:   05/08/20 0815  BP: 114/65  Pulse: 92  Resp: 17  Temp: 98.7 F (37.1 C)  TempSrc: Temporal  SpO2: 100%  Weight: 140 lb 8 oz (63.7 kg)  Height: '5\' 5"'$  (1.651 m)  Body mass index is 23.38 kg/m.  ECOG FS: 1 - Symptomatic but completely ambulatory  GENERAL: Patient is a well appearing female in no acute distress HEENT:  Sclerae anicteric.  Mask in place. Neck is supple.  NODES:  No cervical, supraclavicular, or axillary lymphadenopathy palpated.  BREAST EXAM:  Right breast benign, left breast s/p mastectomy and reconstruction, no sign of local recurrence.   LUNGS:  Clear to auscultation bilaterally.  No wheezes or rhonchi. HEART:  Regular rate and rhythm. No murmur appreciated. ABDOMEN:  Soft, nontender.  Positive, normoactive bowel sounds. No organomegaly palpated. MSK:  No focal spinal tenderness to palpation. Full range of motion bilaterally in the upper extremities. EXTREMITIES: + left arm lymphedema present and stable SKIN:  Clear with  no obvious rashes or skin changes.  No nail dyscrasia. NEURO:  Nonfocal. Well oriented.  Appropriate affect.    LAB RESULTS: No results found for: LABCA2  CBC    Component Value Date/Time   WBC 2.6 (L) 05/08/2020 0800   RBC 3.80 (L) 05/08/2020 0800   HGB 11.8 (L) 05/08/2020 0800   HGB 13.5 07/05/2019 1129   HGB 12.1 10/26/2017 1038   HCT 35.4 (L) 05/08/2020 0800   HCT 35.7 10/26/2017 1038   PLT 66 (L) 05/08/2020 0800   PLT 54 (L) 07/05/2019 1129   PLT 167 10/26/2017 1038   MCV 93.2 05/08/2020 0800   MCV 98.4 10/26/2017 1038   MCH 31.1 05/08/2020 0800   MCHC 33.3 05/08/2020 0800   RDW 13.2 05/08/2020 0800   RDW 13.3 10/26/2017 1038   LYMPHSABS 0.7 05/08/2020 0800   LYMPHSABS 0.5 (L) 10/26/2017 1038   MONOABS 0.2 05/08/2020 0800   MONOABS 0.1 10/26/2017 1038   EOSABS 0.0 05/08/2020 0800   EOSABS 0.0 10/26/2017 1038   BASOSABS 0.0 05/08/2020 0800   BASOSABS 0.0 10/26/2017 1038   CMP Latest Ref Rng & Units 04/16/2020 03/28/2020 03/05/2020  Glucose 70 - 99 mg/dL 95 93 93  BUN 6 - 20 mg/dL _0 Creatinine 0.44 - 1.00 mg/dL 0.94 0.86 0.95  Sodium 135 - 145 mmol/L 140 140 138  Potassium 3.5 - 5.1 mmol/L 4.4 3.8 4.1  Chloride 98 - 111 mmol/L 103 102 101  CO2 22 - 32 mmol/L _1 Calcium 8.9 - 10.3 mg/dL 8.9 9.2 9.3  Total Protein 6.5 - 8.1 g/dL 6.6 6.3(L) 6.8  Total Bilirubin 0.3 - 1.2 mg/dL 0.4 0.4 0.4  Alkaline Phos 38 - 126 U/L 76 75 69  AST 15 - 41 U/L 36 28 31  ALT 0 - 44 U/L _2 STUDIES: No results found.  ELIGIBLE FOR AVAILABLE RESEARCH PROTOCOL: no  ASSESSMENT: 59 y.o. [place] woman with stage IV left-sided breast cancer involving bone and central nervous system  (1) s/p left breast lower inner quadrant biopsy 06/19/2015 for a clinical T2-3 NX invasive ductal carcinoma, grade 2, triple positive.  (2) status post left mastectomy and axillary lymph node dissection  with immediate expander placement 07/18/2015 for an mpT4 pN2,stage IIIB  invasive ductal carcinoma, grade 3, with negative margins.  (a) definitive implant exchange to be scheduled in December   METASTATIC DISEASE: October 2016  (3) CT scan of the chest abdomen and pelvis  09/16/2015 shows metastatic lesions in the right scapula, left iliac crest, L4, and T spine. There were questionable liver cysts, with repeat CT scan 03/02/2016 showing possible right upper lobe lung lesions and possibly increased liver lesions  (a) CT scan of the chest 06/17/2016 shows no active disease in the lungs or liver  (b) Bone scan July 2017 showed no evidence of bony metastatic disease   (c) head CT 07/08/2016 showed a cerebellar lesion, confirmed by MRI 07/23/2016, status post craniotomy 08/14/2016, confirming a metastatic deposit which was estrogen and progesterone receptor negative, HER-2 amplified with a signals ratio of 7.16, number per cell 13.25  (d) CA 27-29 is not informative  (4) received docetaxel every 3 weeks 6 together with trastuzumab and pertuzumab, last docetaxel dose 02/11/2016  (5) adjuvant radiation7/03/2016 to 06/26/2016 at [facility]: 1. The Left chest wall was treated to 23.4 Gy in 13 fractions at 1.8 Gy per fraction. 2. The Left chest wall was boosted to 10 Gy in 5 fractions at 2 Gy per  fraction. 3. The Left Sclav/PAB was treated to 23.4 Gy in 13 fractions at 1.8 Gy per fraction.  [Note: Including the patient's treatment in [place] (received 15 fractions per Dr. Maryan Rued near [place], [place]), the patient received 50.4 Gy to the left chest wall and supraclavicular region. ]  (6) started trastuzumab and pertuzumab October 2016, continuing every 3 weeks,  (a) echocardiogram 02/26/2016 showed a well preserved ejection fraction  (b) echocardiogram 07/01/2016 shows an ejection fraction in the 60-65%   (c) pertuzumab discontinued 10/2016 with uncontrolled diarrhea  (d) echocardiogram 11/11/2016 showed an ejection fraction in the 60-65%  (e) echocardiogram 03/03/2017  shows an ejection fraction of 60-65%  (f) echocardiogram on 05/19/2017 shows an ejection fraction of 55-60%  (g) echocardiogram 09/24/2017 shows the ejection fraction in the 60-65%  (h) echocardiogram 02/14/2018 shows an ejection fraction in the 60-65%  (I) echocardiogram  06/30/2018 shows an ejection fraction in the 55-60%  (m) echocardiogram on 12/08/2018 shows an ejection fraction in the 60-65% range  (7) started denosumab/Xgeva October 2017 given every 4 weeks, transitioned to every 8 weeks beginning 10/11/18 (every 6 weeks while giving TDM1 every 3 weeks)  (8) started anastrozole October 2017   (a) bone scan 11/10/2016 shows no active disease  (b) chest CT scan 11/10/2016 stable, with no evidence of active disease  (c) chest CT and bone scan 07/02/2017 show no evidence of active disease  (d) CT scan of the chest with contrast 11/10/2017 shows some left axillary edema, but no evidence of thrombosis or adenopathy in that area, 0.9 cm precarinal lymph and 0.7 cm right upper lobe nodule node; bone lesions stable  (e) CT of the chest 05/04/2018 shows a 1.4 cm right lower paratracheal node which is slightly increased and a new right prevascular mediastinal node measuring 0.7 cm.  Bone lesions are stable.  (f) chest CT on 07/01/2018 shows no definite findings of metastatic disease in the thorax. Previously noted borderline enlarged low right paratracheal lymph node is stable to slightly decreased in size   (e) chest CT on 12/30/2018 notes mild increase in right paratracheal adneopathy, recommended PET scan.  Pet scan on 01/19/2019 showed a hypermetabolic and enlarged right paratracheal lymph node consistent with breast cancer recurrence.  (f)Trastuzumab discontinued due to February 2020 scans   (g) TDM-1 started on 01/31/2019 given every 21 days.  (h) Chest CT 05/15/2019 shows decrease in mediastinal adenopathy  (i) CT chest on 08/17/2019 shows resolution of thoracic adenopathy  (j) TDM 1 discontinued  after 10/11/2019 dose (8 months treatment) because of thrombocytopenia  (k) trastuzumab resumed 11/01/2019, repeated every 21 days  (l) echocardiogram 12/06/2019 showed an ejection fraction in the 60-65% range  (m) chest CT scan, brain CT and bone scan 01/31/2020 show no evidence of active disease  (9) history of bipolar disorder  (a) currently on Lamictal and Latuda as well as Desiree L and Neurontin  (10) mild anemia with a significant drop in the MCV, ferritin 10 06/03/2016,   (a) Feraheme given 06/12/2016 and 06/18/2016  (11) tobacco abuse: Chantix started 06/18/2016--she is not currently trying to quit   (12) 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  (a) brain irradiation10/19/17-11/08/17: Whole brain/ 35 Gy in 14 fractions   (b) repeat brain MRI obtained 12/10/2016 shows no active disease in the brain  (c) repeat brain MRI 03/02/2017 shows no evidence of residual or recurrent disease  (d) repeat brain MRI 07/29/2017 shows no evidence of residual or recurrent disease  (e)  repeat brain MRI 12/07/2017 shows no evidence of disease recurrence.  There is progressive white matter change secondary to prior treatment.  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  She continues on treatment with Trastuzumab, Anastrozole, and Xgeva.  She is tolerating these well.  She is due for restaging after today's dose, and her bone scan and CT chest has not yet been set up.  I have sent a message to our nursing staff to help get this coordinated.  She desires to restart the TDM1, but we really need to get  restaging to see where everything is before making any changes.  Berlinda and I talked about her constipation.  She was recommended to take miralax daily.  I also gave her a handout in her AVS about constipation and common remedies.    Terilyn will return in 3 months for labs,  F/u, and her next infusion.  She was recommended to continue with the appropriate pandemic precautions. She knows to call for any questions that may arise between now and her next appointment.  We are happy to see her sooner if needed.   Total encounter time 20 minutes.Wilber Bihari, NP 05/08/20 8:40 AM Medical Oncology and Hematology [facility] [place], [place] [address] Tel. [phone]    Fax. [phone]   *Total Encounter Time as defined by the Centers for Medicare and Medicaid Services includes, in addition to the face-to-face time of a patient visit (documented in the note above) non-face-to-face time: obtaining and reviewing outside history, ordering and reviewing medications, tests or procedures, care coordination (communications with other health care professionals or caregivers) and documentation in the medical record.

## 2020-05-08 ENCOUNTER — Encounter: Payer: Self-pay | Admitting: Adult Health

## 2020-05-08 ENCOUNTER — Inpatient Hospital Stay: Payer: Medicaid Other | Attending: Medical

## 2020-05-08 ENCOUNTER — Inpatient Hospital Stay (HOSPITAL_BASED_OUTPATIENT_CLINIC_OR_DEPARTMENT_OTHER): Payer: Medicaid Other | Admitting: Adult Health

## 2020-05-08 ENCOUNTER — Other Ambulatory Visit: Payer: Self-pay

## 2020-05-08 ENCOUNTER — Inpatient Hospital Stay: Payer: Medicaid Other

## 2020-05-08 VITALS — BP 114/65 | HR 92 | Temp 98.7°F | Resp 17 | Ht 65.0 in | Wt 140.5 lb

## 2020-05-08 DIAGNOSIS — Z170 Estrogen receptor positive status [ER+]: Secondary | ICD-10-CM | POA: Insufficient documentation

## 2020-05-08 DIAGNOSIS — D696 Thrombocytopenia, unspecified: Secondary | ICD-10-CM

## 2020-05-08 DIAGNOSIS — F17210 Nicotine dependence, cigarettes, uncomplicated: Secondary | ICD-10-CM | POA: Insufficient documentation

## 2020-05-08 DIAGNOSIS — C770 Secondary and unspecified malignant neoplasm of lymph nodes of head, face and neck: Secondary | ICD-10-CM

## 2020-05-08 DIAGNOSIS — C7931 Secondary malignant neoplasm of brain: Secondary | ICD-10-CM | POA: Insufficient documentation

## 2020-05-08 DIAGNOSIS — M549 Dorsalgia, unspecified: Secondary | ICD-10-CM | POA: Diagnosis not present

## 2020-05-08 DIAGNOSIS — Z923 Personal history of irradiation: Secondary | ICD-10-CM | POA: Diagnosis not present

## 2020-05-08 DIAGNOSIS — F411 Generalized anxiety disorder: Secondary | ICD-10-CM

## 2020-05-08 DIAGNOSIS — Z5112 Encounter for antineoplastic immunotherapy: Secondary | ICD-10-CM | POA: Insufficient documentation

## 2020-05-08 DIAGNOSIS — C50119 Malignant neoplasm of central portion of unspecified female breast: Secondary | ICD-10-CM

## 2020-05-08 DIAGNOSIS — Z95828 Presence of other vascular implants and grafts: Secondary | ICD-10-CM

## 2020-05-08 DIAGNOSIS — C50312 Malignant neoplasm of lower-inner quadrant of left female breast: Secondary | ICD-10-CM | POA: Diagnosis present

## 2020-05-08 DIAGNOSIS — K5900 Constipation, unspecified: Secondary | ICD-10-CM | POA: Insufficient documentation

## 2020-05-08 LAB — CBC WITH DIFFERENTIAL/PLATELET
Abs Immature Granulocytes: 0.02 10*3/uL (ref 0.00–0.07)
Basophils Absolute: 0 10*3/uL (ref 0.0–0.1)
Basophils Relative: 0 %
Eosinophils Absolute: 0 10*3/uL (ref 0.0–0.5)
Eosinophils Relative: 0 %
HCT: 35.4 % — ABNORMAL LOW (ref 36.0–46.0)
Hemoglobin: 11.8 g/dL — ABNORMAL LOW (ref 12.0–15.0)
Immature Granulocytes: 1 %
Lymphocytes Relative: 25 %
Lymphs Abs: 0.7 10*3/uL (ref 0.7–4.0)
MCH: 31.1 pg (ref 26.0–34.0)
MCHC: 33.3 g/dL (ref 30.0–36.0)
MCV: 93.2 fL (ref 80.0–100.0)
Monocytes Absolute: 0.2 10*3/uL (ref 0.1–1.0)
Monocytes Relative: 8 %
Neutro Abs: 1.7 10*3/uL (ref 1.7–7.7)
Neutrophils Relative %: 66 %
Platelets: 66 10*3/uL — ABNORMAL LOW (ref 150–400)
RBC: 3.8 MIL/uL — ABNORMAL LOW (ref 3.87–5.11)
RDW: 13.2 % (ref 11.5–15.5)
WBC: 2.6 10*3/uL — ABNORMAL LOW (ref 4.0–10.5)
nRBC: 0 % (ref 0.0–0.2)

## 2020-05-08 LAB — COMPREHENSIVE METABOLIC PANEL
ALT: 28 U/L (ref 0–44)
AST: 34 U/L (ref 15–41)
Albumin: 3.8 g/dL (ref 3.5–5.0)
Alkaline Phosphatase: 75 U/L (ref 38–126)
Anion gap: 14 (ref 5–15)
BUN: 8 mg/dL (ref 6–20)
CO2: 24 mmol/L (ref 22–32)
Calcium: 9.6 mg/dL (ref 8.9–10.3)
Chloride: 102 mmol/L (ref 98–111)
Creatinine, Ser: 0.98 mg/dL (ref 0.44–1.00)
GFR calc Af Amer: >60 mL/min (ref >60)
GFR calc non Af Amer: >60 mL/min (ref >60)
Glucose, Bld: 97 mg/dL (ref 70–99)
Potassium: 4.1 mmol/L (ref 3.5–5.1)
Sodium: 140 mmol/L (ref 135–145)
Total Bilirubin: 0.2 mg/dL — ABNORMAL LOW (ref 0.3–1.2)
Total Protein: 6.8 g/dL (ref 6.5–8.1)

## 2020-05-08 MED ORDER — SODIUM CHLORIDE 0.9% FLUSH
10.0000 mL | Freq: Once | INTRAVENOUS | Status: AC
  Administered 2020-05-08: 10 mL
  Filled 2020-05-08: qty 10

## 2020-05-08 MED ORDER — HEPARIN SOD (PORK) LOCK FLUSH 100 UNIT/ML IV SOLN
500.0000 [IU] | Freq: Once | INTRAVENOUS | Status: AC | PRN
  Administered 2020-05-08: 500 [IU]
  Filled 2020-05-08: qty 5

## 2020-05-08 MED ORDER — SODIUM CHLORIDE 0.9% FLUSH
10.0000 mL | INTRAVENOUS | Status: DC | PRN
  Administered 2020-05-08: 10 mL
  Filled 2020-05-08: qty 10

## 2020-05-08 MED ORDER — ACETAMINOPHEN 325 MG PO TABS
650.0000 mg | ORAL_TABLET | Freq: Once | ORAL | Status: AC
  Administered 2020-05-08: 650 mg via ORAL

## 2020-05-08 MED ORDER — TRASTUZUMAB-DKST CHEMO 150 MG IV SOLR
6.0000 mg/kg | Freq: Once | INTRAVENOUS | Status: AC
  Administered 2020-05-08: 399 mg via INTRAVENOUS
  Filled 2020-05-08: qty 19

## 2020-05-08 MED ORDER — SODIUM CHLORIDE 0.9 % IV SOLN
Freq: Once | INTRAVENOUS | Status: AC
  Filled 2020-05-08: qty 250

## 2020-05-08 MED ORDER — ACETAMINOPHEN 325 MG PO TABS
ORAL_TABLET | ORAL | Status: AC
  Filled 2020-05-08: qty 2

## 2020-05-08 NOTE — Patient Instructions (Signed)
[facility] °Discharge Instructions for Patients Receiving Chemotherapy ° °Today you received the following chemotherapy agents Trastuzumab ° °To help prevent nausea and vomiting after your treatment, we encourage you to take your nausea medication as directed. °  °If you develop nausea and vomiting that is not controlled by your nausea medication, call the clinic.  ° °BELOW ARE SYMPTOMS THAT SHOULD BE REPORTED IMMEDIATELY: °· *FEVER GREATER THAN 100.5 F °· *CHILLS WITH OR WITHOUT FEVER °· NAUSEA AND VOMITING THAT IS NOT CONTROLLED WITH YOUR NAUSEA MEDICATION °· *UNUSUAL SHORTNESS OF BREATH °· *UNUSUAL BRUISING OR BLEEDING °· TENDERNESS IN MOUTH AND THROAT WITH OR WITHOUT PRESENCE OF ULCERS °· *URINARY PROBLEMS °· *BOWEL PROBLEMS °· UNUSUAL RASH °Items with * indicate a potential emergency and should be followed up as soon as possible. ° °Feel free to call the clinic should you have any questions or concerns. The clinic phone number is [phone]. ° °Please show the CHEMO ALERT CARD at check-in to the Emergency Department and triage nurse. ° ° °

## 2020-05-08 NOTE — Patient Instructions (Signed)
For your bowels: recommend taking Miralax daily.    Constipation, Adult Constipation is when a person:  Poops (has a bowel movement) fewer times in a week than normal.  Has a hard time pooping.  Has poop that is dry, hard, or bigger than normal. Follow these instructions at home: Eating and drinking   Eat foods that have a lot of fiber, such as: ? Fresh fruits and vegetables. ? Whole grains. ? Beans.  Eat less of foods that are high in fat, low in fiber, or overly processed, such as: ? [place] fries. ? Hamburgers. ? Cookies. ? Candy. ? Soda.  Drink enough fluid to keep your pee (urine) clear or pale yellow. General instructions  Exercise regularly or as told by your doctor.  Go to the restroom when you feel like you need to poop. Do not hold it in.  Take over-the-counter and prescription medicines only as told by your doctor. These include any fiber supplements.  Do pelvic floor retraining exercises, such as: ? Doing deep breathing while relaxing your lower belly (abdomen). ? Relaxing your pelvic floor while pooping.  Watch your condition for any changes.  Keep all follow-up visits as told by your doctor. This is important. Contact a doctor if:  You have pain that gets worse.  You have a fever.  You have not pooped for 4 days.  You throw up (vomit).  You are not hungry.  You lose weight.  You are bleeding from the anus.  You have thin, pencil-like poop (stool). Get help right away if:  You have a fever, and your symptoms suddenly get worse.  You leak poop or have blood in your poop.  Your belly feels hard or bigger than normal (is bloated).  You have very bad belly pain.  You feel dizzy or you faint. This information is not intended to replace advice given to you by your health care provider. Make sure you discuss any questions you have with your health care provider. Document Revised: 10/29/2017 Document Reviewed: 05/06/2016 Elsevier Patient  Education  2020 [employer].

## 2020-05-09 ENCOUNTER — Telehealth: Payer: Self-pay | Admitting: Adult Health

## 2020-05-09 NOTE — Telephone Encounter (Signed)
Scheduled appts per 6/9 los. Left voicemail with appt date and time.

## 2020-05-09 NOTE — Telephone Encounter (Signed)
R/s appt per 6/10 sch message- pt is aware of new appt time

## 2020-05-16 ENCOUNTER — Other Ambulatory Visit: Payer: Self-pay | Admitting: Adult Health

## 2020-05-16 ENCOUNTER — Other Ambulatory Visit: Payer: Self-pay | Admitting: *Deleted

## 2020-05-16 MED ORDER — ONDANSETRON HCL 8 MG PO TABS: 8.0000 mg | ORAL_TABLET | Freq: Three times a day (TID) | ORAL | 1 refills | Status: DC | PRN

## 2020-05-18 ENCOUNTER — Other Ambulatory Visit (HOSPITAL_COMMUNITY)
Admission: RE | Admit: 2020-05-18 | Discharge: 2020-05-18 | Disposition: A | Discharge status: Home / Self Care | Payer: Medicaid Other | Source: Ambulatory Visit | Attending: Anesthesiology | Admitting: Anesthesiology

## 2020-05-18 DIAGNOSIS — Z791 Long term (current) use of non-steroidal anti-inflammatories (NSAID): Secondary | ICD-10-CM | POA: Diagnosis not present

## 2020-05-18 DIAGNOSIS — Z888 Allergy status to other drugs, medicaments and biological substances status: Secondary | ICD-10-CM | POA: Diagnosis not present

## 2020-05-18 DIAGNOSIS — M1990 Unspecified osteoarthritis, unspecified site: Secondary | ICD-10-CM | POA: Diagnosis not present

## 2020-05-18 DIAGNOSIS — Z885 Allergy status to narcotic agent status: Secondary | ICD-10-CM | POA: Diagnosis not present

## 2020-05-18 DIAGNOSIS — Z9010 Acquired absence of unspecified breast and nipple: Secondary | ICD-10-CM | POA: Diagnosis not present

## 2020-05-18 DIAGNOSIS — C773 Secondary and unspecified malignant neoplasm of axilla and upper limb lymph nodes: Secondary | ICD-10-CM | POA: Diagnosis not present

## 2020-05-18 DIAGNOSIS — Z853 Personal history of malignant neoplasm of breast: Secondary | ICD-10-CM | POA: Diagnosis not present

## 2020-05-18 DIAGNOSIS — F17210 Nicotine dependence, cigarettes, uncomplicated: Secondary | ICD-10-CM | POA: Diagnosis not present

## 2020-05-18 DIAGNOSIS — Z20822 Contact with and (suspected) exposure to covid-19: Secondary | ICD-10-CM | POA: Diagnosis not present

## 2020-05-18 DIAGNOSIS — Z881 Allergy status to other antibiotic agents status: Secondary | ICD-10-CM | POA: Diagnosis not present

## 2020-05-18 DIAGNOSIS — C7951 Secondary malignant neoplasm of bone: Secondary | ICD-10-CM | POA: Diagnosis not present

## 2020-05-18 DIAGNOSIS — Z08 Encounter for follow-up examination after completed treatment for malignant neoplasm: Secondary | ICD-10-CM | POA: Diagnosis not present

## 2020-05-18 DIAGNOSIS — F1120 Opioid dependence, uncomplicated: Secondary | ICD-10-CM | POA: Diagnosis not present

## 2020-05-18 DIAGNOSIS — K219 Gastro-esophageal reflux disease without esophagitis: Secondary | ICD-10-CM | POA: Diagnosis not present

## 2020-05-18 DIAGNOSIS — F319 Bipolar disorder, unspecified: Secondary | ICD-10-CM | POA: Diagnosis not present

## 2020-05-18 DIAGNOSIS — Z79810 Long term (current) use of selective estrogen receptor modulators (SERMs): Secondary | ICD-10-CM | POA: Diagnosis not present

## 2020-05-18 DIAGNOSIS — M797 Fibromyalgia: Secondary | ICD-10-CM | POA: Diagnosis not present

## 2020-05-18 DIAGNOSIS — Z79899 Other long term (current) drug therapy: Secondary | ICD-10-CM | POA: Diagnosis not present

## 2020-05-18 DIAGNOSIS — C7931 Secondary malignant neoplasm of brain: Secondary | ICD-10-CM | POA: Diagnosis present

## 2020-05-18 LAB — SARS CORONAVIRUS 2 (TAT 6-24 HRS): SARS Coronavirus 2: NEGATIVE

## 2020-05-20 ENCOUNTER — Encounter (HOSPITAL_COMMUNITY): Payer: Self-pay | Admitting: *Deleted

## 2020-05-20 NOTE — Progress Notes (Signed)
Patient denies chest pain, shortness of breath, or cardiology visit. Cardiac test noted on chart. Spoke to [place], [place] regarding chronic low plt count. Patient reports that she has been to pickup prescriptions and to grocery store since COVID test but was wearing a mask. Educated on Environmental manager.

## 2020-05-20 NOTE — Progress Notes (Signed)
Anesthesia Chart Review:  Pt is a same day work up   Case: [id number] Date/Time: 05/21/20 0945   Procedure: MRI WITH ANESTHESIA OF BRAIN WITH AND WITHOUT CONTRAST (N/A )   Anesthesia type: General   Pre-op diagnosis: BRAIN METASTASES   Location: [facility] OR RADIOLOGY ROOM / [facility]   Surgeons: Radiologist, Medication, MD      DISCUSSION:  Pt is a 59 year old with hx metastatic breast cancer. Current smoker.   PROVIDERS: - PCP is Charlott Rakes, MD  - Oncologist is Lurline Del, MD. Last office visit 05/08/20 with Wilber Bihari, NP   LABS:  - CBC with diff 05/08/20 showed: WBC 2.6, H/H 11.8/35.4, platelets 66. Platelets have ranged 37-76 over last 12 months and is regularly followed by oncology - CMP 05/08/20 was normal   IMAGES:  CT chest with contrast 08/17/19:  1. Stable exam. No change in scattered sclerotic metastasis in the thoracic spine. 2. No suspicious pulmonary nodule or mass. Resolution of previous thoracic adenopathy.   EKG: N/A   CV:  Echo 03/11/20:  1. Global longitudinal strain is -19.1%.  2. Left ventricular ejection fraction, by estimation, is 65 to 70%. The left ventricle has normal function. The left ventricle has no regional wall motion abnormalities. There is mild left ventricular hypertrophy. Left ventricular diastolic parameters were normal.  3. Right ventricular systolic function is normal. The right ventricular size is normal.  4. The mitral valve is abnormal. Trivial mitral valve regurgitation. Mild mitral stenosis.  5. The aortic valve is normal in structure. Aortic valve regurgitation is trivial.  - Comparison(s): No significant change from prior study. 12/06/19 EF 60-65%.    Past Medical History:  Diagnosis Date  . Alcohol abuse    none since 2013  . Anemia    during chemo  . Anxiety    At age 73  . Arthritis Dx 2010  . Bipolar disorder ([place])   . Bronchitis   . Cancer ([place])    breast mets to brain  . Chronic pain    resolved per patient  12/25/19  . Complication of anesthesia   . Depression   . Family history of adverse reaction to anesthesia    MOther had PONV  . GERD (gastroesophageal reflux disease)   . Headache    hx  migraines  . Lymphedema of left arm   . Opiate dependence ([place])   . PONV (postoperative nausea and vomiting)   . Port-A-Cath in place   . PTSD (post-traumatic stress disorder)   . S/P endometrial ablation    in md's office    Past Surgical History:  Procedure Laterality Date  . APPLICATION OF CRANIAL NAVIGATION N/A 08/14/2016   Procedure: APPLICATION OF CRANIAL NAVIGATION;  Surgeon: Erline Levine, MD;  Location: [facility];  Service: Neurosurgery;  Laterality: N/A;  . BREAST RECONSTRUCTION Left    with silicone implant  . COLONOSCOPY W/ POLYPECTOMY    . CRANIOTOMY N/A 08/14/2016   Procedure: CRANIOTOMY TUMOR EXCISION WITH Lucky Rathke;  Surgeon: Erline Levine, MD;  Location: [facility];  Service: Neurosurgery;  Laterality: N/A;  . FIBULA FRACTURE SURGERY Left   . MASTECTOMY Left   . RADIOLOGY WITH ANESTHESIA N/A 07/23/2016   Procedure: MRI OF BRAIN WITH AND WITHOUT;  Surgeon: Medication Radiologist, MD;  Location: [facility];  Service: Radiology;  Laterality: N/A;  . RADIOLOGY WITH ANESTHESIA N/A 09/08/2016   Procedure: MRI OF BRAIN WITH AND WITHOUT CONTRAST;  Surgeon: Medication Radiologist, MD;  Location: [place];  Service: Radiology;  Laterality: N/A;  . RADIOLOGY WITH ANESTHESIA N/A 12/10/2016   Procedure: MRI OF BRAIN WITH AND WITHOUT;  Surgeon: Medication Radiologist, MD;  Location: [facility] [place];  Service: Radiology;  Laterality: N/A;  . RADIOLOGY WITH ANESTHESIA N/A 03/02/2017   Procedure: MRI of BRAIN W and W/OUT CONTRAST;  Surgeon: Medication Radiologist, MD;  Location: [facility];  Service: Radiology;  Laterality: N/A;  . RADIOLOGY WITH ANESTHESIA N/A 07/29/2017   Procedure: RADIOLOGY WITH ANESTHESIA MRI OF BRAIN WITH AND WITHOUT CONTRAST;  Surgeon: Radiologist, Medication, MD;  Location: [facility];  Service:  Radiology;  Laterality: N/A;  . RADIOLOGY WITH ANESTHESIA N/A 12/07/2017   Procedure: MRI WITH ANESTHESIA OF BRAIN WITH AND WITHOUT CONTRAST;  Surgeon: Radiologist, Medication, MD;  Location: [facility];  Service: Radiology;  Laterality: N/A;  . RADIOLOGY WITH ANESTHESIA N/A 04/07/2018   Procedure: MRI OF BRAIN WITH AND WITHOUT CONTRAST;  Surgeon: Radiologist, Medication, MD;  Location: [facility];  Service: Radiology;  Laterality: N/A;  . RADIOLOGY WITH ANESTHESIA N/A 08/23/2018   Procedure: MRI WITH ANESTHESIA OF THE BRAIN WITH AND WITHOUT;  Surgeon: Radiologist, Medication, MD;  Location: [place];  Service: Radiology;  Laterality: N/A;  . RADIOLOGY WITH ANESTHESIA N/A 01/24/2019   Procedure: MRI OF BRAIN WITH AND WITHOUT CONTRAST;  Surgeon: Radiologist, Medication, MD;  Location: [place];  Service: Radiology;  Laterality: N/A;  . RADIOLOGY WITH ANESTHESIA N/A 07/06/2019   Procedure: MRI WITH ANESTHESIA OF BRAIN WITH AND WITHOUT CONTRAST;  Surgeon: Radiologist, Medication, MD;  Location: [facility];  Service: Radiology;  Laterality: N/A;  . RADIOLOGY WITH ANESTHESIA N/A 01/11/2020   Procedure: MRI WITH ANESTHESIA BRAIN WITH AND WITHOUT;  Surgeon: Radiologist, Medication, MD;  Location: [facility];  Service: Radiology;  Laterality: N/A;  . right power port placement Right     MEDICATIONS: No current facility-administered medications for this encounter.   [name] acetaminophen (TYLENOL) 500 MG tablet  . anastrozole (ARIMIDEX) 1 MG tablet  . azelastine (ASTELIN) 0.1 % nasal spray  . Cholecalciferol (VITAMIN D3) 250 MCG (10000 UT) capsule  . cyclobenzaprine (FLEXERIL) 10 MG tablet  . cycloSPORINE (RESTASIS) 0.05 % ophthalmic emulsion  . fluticasone (FLONASE) 50 MCG/ACT nasal spray  . gabapentin (NEURONTIN) 300 MG capsule  . ibuprofen (ADVIL) 800 MG tablet  . lamoTRIgine (LAMICTAL) 100 MG tablet  . loratadine (CLARITIN) 10 MG tablet  . Lurasidone HCl (LATUDA) 60 MG TABS  . pantoprazole (PROTONIX) 40 MG tablet  . traZODone  (DESYREL) 100 MG tablet  . ondansetron (ZOFRAN) 8 MG tablet    If no changes, I anticipate pt can proceed with surgery as scheduled.   Willeen Cass, FNP-BC [facility]/Anesthesiology Phone: [phone] 05/20/2020 3:15 PM

## 2020-05-21 ENCOUNTER — Encounter (HOSPITAL_COMMUNITY): Payer: Self-pay | Admitting: Internal Medicine

## 2020-05-21 ENCOUNTER — Ambulatory Visit (HOSPITAL_COMMUNITY): Payer: Medicaid Other | Admitting: Emergency Medicine

## 2020-05-21 ENCOUNTER — Ambulatory Visit (HOSPITAL_COMMUNITY)
Admission: RE | Admit: 2020-05-21 | Discharge: 2020-05-21 | Disposition: A | Discharge status: Home / Self Care | Payer: Medicaid Other | Attending: Internal Medicine | Admitting: Internal Medicine

## 2020-05-21 ENCOUNTER — Encounter (HOSPITAL_COMMUNITY)
Admission: RE | Disposition: A | Discharge status: Home / Self Care | Payer: Self-pay | Source: Home / Self Care | Attending: Internal Medicine

## 2020-05-21 ENCOUNTER — Ambulatory Visit (HOSPITAL_COMMUNITY)
Admission: RE | Admit: 2020-05-21 | Discharge: 2020-05-21 | Disposition: A | Discharge status: Home / Self Care | Payer: Medicaid Other | Source: Ambulatory Visit | Attending: Internal Medicine | Admitting: Internal Medicine

## 2020-05-21 ENCOUNTER — Other Ambulatory Visit: Payer: Self-pay

## 2020-05-21 DIAGNOSIS — Z08 Encounter for follow-up examination after completed treatment for malignant neoplasm: Secondary | ICD-10-CM | POA: Insufficient documentation

## 2020-05-21 DIAGNOSIS — C7951 Secondary malignant neoplasm of bone: Secondary | ICD-10-CM | POA: Diagnosis not present

## 2020-05-21 DIAGNOSIS — Z853 Personal history of malignant neoplasm of breast: Secondary | ICD-10-CM | POA: Insufficient documentation

## 2020-05-21 DIAGNOSIS — K219 Gastro-esophageal reflux disease without esophagitis: Secondary | ICD-10-CM | POA: Insufficient documentation

## 2020-05-21 DIAGNOSIS — Z791 Long term (current) use of non-steroidal anti-inflammatories (NSAID): Secondary | ICD-10-CM | POA: Insufficient documentation

## 2020-05-21 DIAGNOSIS — Z885 Allergy status to narcotic agent status: Secondary | ICD-10-CM | POA: Insufficient documentation

## 2020-05-21 DIAGNOSIS — C773 Secondary and unspecified malignant neoplasm of axilla and upper limb lymph nodes: Secondary | ICD-10-CM | POA: Diagnosis not present

## 2020-05-21 DIAGNOSIS — C7931 Secondary malignant neoplasm of brain: Secondary | ICD-10-CM

## 2020-05-21 DIAGNOSIS — M1990 Unspecified osteoarthritis, unspecified site: Secondary | ICD-10-CM | POA: Insufficient documentation

## 2020-05-21 DIAGNOSIS — Z79899 Other long term (current) drug therapy: Secondary | ICD-10-CM | POA: Insufficient documentation

## 2020-05-21 DIAGNOSIS — Z9010 Acquired absence of unspecified breast and nipple: Secondary | ICD-10-CM | POA: Insufficient documentation

## 2020-05-21 DIAGNOSIS — Z881 Allergy status to other antibiotic agents status: Secondary | ICD-10-CM | POA: Insufficient documentation

## 2020-05-21 DIAGNOSIS — Z20822 Contact with and (suspected) exposure to covid-19: Secondary | ICD-10-CM | POA: Insufficient documentation

## 2020-05-21 DIAGNOSIS — Z79810 Long term (current) use of selective estrogen receptor modulators (SERMs): Secondary | ICD-10-CM | POA: Insufficient documentation

## 2020-05-21 DIAGNOSIS — F319 Bipolar disorder, unspecified: Secondary | ICD-10-CM | POA: Insufficient documentation

## 2020-05-21 DIAGNOSIS — F17210 Nicotine dependence, cigarettes, uncomplicated: Secondary | ICD-10-CM | POA: Insufficient documentation

## 2020-05-21 DIAGNOSIS — M797 Fibromyalgia: Secondary | ICD-10-CM | POA: Insufficient documentation

## 2020-05-21 DIAGNOSIS — F1120 Opioid dependence, uncomplicated: Secondary | ICD-10-CM | POA: Insufficient documentation

## 2020-05-21 DIAGNOSIS — Z888 Allergy status to other drugs, medicaments and biological substances status: Secondary | ICD-10-CM | POA: Insufficient documentation

## 2020-05-21 HISTORY — PX: RADIOLOGY WITH ANESTHESIA: SHX6223

## 2020-05-21 SURGERY — MRI WITH ANESTHESIA
Anesthesia: General

## 2020-05-21 MED ORDER — OXYCODONE HCL 5 MG/5ML PO SOLN: 5.0000 mg | Freq: Once | ORAL | Status: AC | PRN

## 2020-05-21 MED ORDER — OXYCODONE HCL 5 MG PO TABS
ORAL_TABLET | ORAL | Status: AC
  Administered 2020-05-21: 5 mg via ORAL
  Filled 2020-05-21: qty 1

## 2020-05-21 MED ORDER — ORAL CARE MOUTH RINSE: 15.0000 mL | Freq: Once | OROMUCOSAL | Status: AC

## 2020-05-21 MED ORDER — ONDANSETRON HCL 4 MG/2ML IJ SOLN
INTRAMUSCULAR | Status: DC | PRN
  Administered 2020-05-21: 4 mg via INTRAVENOUS

## 2020-05-21 MED ORDER — PHENYLEPHRINE HCL-NACL 10-0.9 MG/250ML-% IV SOLN
INTRAVENOUS | Status: DC | PRN
  Administered 2020-05-21: 40 ug/min via INTRAVENOUS

## 2020-05-21 MED ORDER — CHLORHEXIDINE GLUCONATE 0.12 % MT SOLN
15.0000 mL | Freq: Once | OROMUCOSAL | Status: AC
  Administered 2020-05-21: 15 mL via OROMUCOSAL
  Filled 2020-05-21: qty 15

## 2020-05-21 MED ORDER — GADOBUTROL 1 MMOL/ML IV SOLN
6.0000 mL | Freq: Once | INTRAVENOUS | Status: AC | PRN
  Administered 2020-05-21: 6 mL via INTRAVENOUS

## 2020-05-21 MED ORDER — LACTATED RINGERS IV SOLN: INTRAVENOUS | Status: DC

## 2020-05-21 MED ORDER — PHENYLEPHRINE 40 MCG/ML (10ML) SYRINGE FOR IV PUSH (FOR BLOOD PRESSURE SUPPORT)
PREFILLED_SYRINGE | INTRAVENOUS | Status: DC | PRN
  Administered 2020-05-21: 120 ug via INTRAVENOUS

## 2020-05-21 MED ORDER — OXYCODONE HCL 5 MG PO TABS: 5.0000 mg | ORAL_TABLET | Freq: Once | ORAL | Status: AC | PRN

## 2020-05-21 MED ORDER — PROPOFOL 10 MG/ML IV BOLUS
INTRAVENOUS | Status: DC | PRN
  Administered 2020-05-21: 200 mg via INTRAVENOUS

## 2020-05-21 MED ORDER — MIDAZOLAM HCL 5 MG/5ML IJ SOLN
INTRAMUSCULAR | Status: DC | PRN
  Administered 2020-05-21 (×2): 1 mg via INTRAVENOUS

## 2020-05-21 MED ORDER — LIDOCAINE 2% (20 MG/ML) 5 ML SYRINGE
INTRAMUSCULAR | Status: DC | PRN
  Administered 2020-05-21: 50 mg via INTRAVENOUS

## 2020-05-21 MED ORDER — FENTANYL CITRATE (PF) 100 MCG/2ML IJ SOLN: 25.0000 ug | INTRAMUSCULAR | Status: DC | PRN

## 2020-05-21 MED ORDER — PROMETHAZINE HCL 25 MG/ML IJ SOLN: 6.2500 mg | INTRAMUSCULAR | Status: DC | PRN

## 2020-05-21 MED ORDER — MIDAZOLAM HCL 2 MG/2ML IJ SOLN
INTRAMUSCULAR | Status: AC
  Filled 2020-05-21: qty 2

## 2020-05-21 MED ORDER — DEXAMETHASONE SODIUM PHOSPHATE 10 MG/ML IJ SOLN
INTRAMUSCULAR | Status: DC | PRN
  Administered 2020-05-21: 4 mg via INTRAVENOUS

## 2020-05-21 NOTE — Transfer of Care (Signed)
Immediate Anesthesia Transfer of Care Note  Patient: [name]  Procedure(s) Performed: MRI WITH ANESTHESIA OF BRAIN WITH AND WITHOUT CONTRAST (N/A )  Patient Location: PACU  Anesthesia Type:General  Level of Consciousness: awake  Airway & Oxygen Therapy: Patient Spontanous Breathing and Patient connected to face mask oxygen  Post-op Assessment: Report given to RN and Post -op Vital signs reviewed and stable  Post vital signs: Reviewed and stable  Last Vitals:  Vitals Value Taken Time  BP 122/67 05/21/20 1132  Temp 36.8 C 05/21/20 1130  Pulse 83 05/21/20 1134  Resp 17 05/21/20 1134  SpO2 97 % 05/21/20 1134  Vitals shown include unvalidated device data.  Last Pain:  Vitals:   05/21/20 1130  PainSc: 3       Patients Stated Pain Goal: 5 (82/42/35 3614)  Complications: No complications documented.

## 2020-05-21 NOTE — Anesthesia Preprocedure Evaluation (Addendum)
Anesthesia Evaluation  Patient identified by MRN, date of birth, ID band Patient awake    Reviewed: Allergy & Precautions, NPO status , Patient's Chart, lab work & pertinent test results  History of Anesthesia Complications (+) PONV and history of anesthetic complications  Airway Mallampati: III  TM Distance: >3 FB Neck ROM: Full    Dental  (+) Dental Advisory Given, Chipped,    Pulmonary Current SmokerPatient did not abstain from smoking.,    Pulmonary exam normal        Cardiovascular negative cardio ROS Normal cardiovascular exam     Neuro/Psych  Headaches, PSYCHIATRIC DISORDERS Anxiety Depression Bipolar Disorder  Brain metastases     GI/Hepatic GERD  Medicated and Controlled,(+)     substance abuse  alcohol use,   Endo/Other  negative endocrine ROS  Renal/GU negative Renal ROS     Musculoskeletal  (+) Arthritis , Fibromyalgia -, narcotic dependent  Abdominal   Peds  Hematology  (+) anemia ,  Pancytopenia (WBC 2.6, Plt 66, Hb 11.8)    Anesthesia Other Findings Covid test negative   Reproductive/Obstetrics  Breast cancer with metastases to brain                             Anesthesia Physical Anesthesia Plan  ASA: III  Anesthesia Plan: General   Post-op Pain Management:    Induction: Intravenous  PONV Risk Score and Plan: 4 or greater and Treatment may vary due to age or medical condition, Ondansetron, Dexamethasone and Midazolam  Airway Management Planned: LMA  Additional Equipment: None  Intra-op Plan:   Post-operative Plan: Extubation in OR  Informed Consent: I have reviewed the patients History and Physical, chart, labs and discussed the procedure including the risks, benefits and alternatives for the proposed anesthesia with the patient or authorized representative who has indicated his/her understanding and acceptance.     Dental advisory given  Plan  Discussed with: CRNA and Anesthesiologist  Anesthesia Plan Comments:        Anesthesia Quick Evaluation

## 2020-05-21 NOTE — Anesthesia Procedure Notes (Signed)
Procedure Name: LMA Insertion °Performed by: Emanual Lamountain H, CRNA °Pre-anesthesia Checklist: Patient identified, Emergency Drugs available, Suction available and Patient being monitored °Patient Re-evaluated:Patient Re-evaluated prior to induction °Oxygen Delivery Method: Circle System Utilized °Preoxygenation: Pre-oxygenation with 100% oxygen °Induction Type: IV induction °Ventilation: Mask ventilation without difficulty °LMA: LMA inserted °LMA Size: 4.0 °Number of attempts: 1 °Airway Equipment and Method: Bite block °Placement Confirmation: positive ETCO2 °Tube secured with: Tape °Dental Injury: Teeth and Oropharynx as per pre-operative assessment  ° ° ° ° ° ° °

## 2020-05-21 NOTE — Progress Notes (Signed)
Per Dr. Fransisco Beau no need to repeat labs.

## 2020-05-22 ENCOUNTER — Other Ambulatory Visit: Payer: Self-pay | Admitting: Family Medicine

## 2020-05-22 ENCOUNTER — Other Ambulatory Visit: Payer: Self-pay | Admitting: Oncology

## 2020-05-22 ENCOUNTER — Telehealth: Payer: Self-pay | Admitting: *Deleted

## 2020-05-22 ENCOUNTER — Encounter (HOSPITAL_COMMUNITY): Payer: Self-pay | Admitting: Radiology

## 2020-05-22 NOTE — Anesthesia Postprocedure Evaluation (Signed)
Anesthesia Post Note  Patient: [name]  Procedure(s) Performed: MRI WITH ANESTHESIA OF BRAIN WITH AND WITHOUT CONTRAST (N/A )     Patient location during evaluation: PACU Anesthesia Type: General Level of consciousness: awake and alert Pain management: pain level controlled Vital Signs Assessment: post-procedure vital signs reviewed and stable Respiratory status: spontaneous breathing, nonlabored ventilation, respiratory function stable and patient connected to nasal cannula oxygen Cardiovascular status: blood pressure returned to baseline and stable Postop Assessment: no apparent nausea or vomiting Anesthetic complications: no   No complications documented.  Last Vitals:  Vitals:   05/21/20 1130 05/21/20 1145  BP: 122/67 131/77  Pulse: 79 85  Resp: 17 16  Temp: 36.8 C 36.9 C  SpO2: 98% 96%    Last Pain:  Vitals:   05/21/20 1145  PainSc: 0-No pain                 Tiajuana Amass

## 2020-05-22 NOTE — Telephone Encounter (Signed)
Received call from patient requesting the results of her brain MRI that she had done yesterday.  Advised that I would send a message to Dr. Mickeal Skinner and have him call her.  Advised that he is not in the office today and may not call until tomorrow.  Pt voiced understanding.

## 2020-05-24 ENCOUNTER — Telehealth: Payer: Self-pay | Admitting: Internal Medicine

## 2020-05-24 NOTE — Telephone Encounter (Signed)
Contacted patient to verify telephone visit for pre reg °

## 2020-05-27 ENCOUNTER — Telehealth: Payer: Self-pay | Admitting: Internal Medicine

## 2020-05-27 ENCOUNTER — Other Ambulatory Visit: Payer: Self-pay | Admitting: *Deleted

## 2020-05-27 ENCOUNTER — Inpatient Hospital Stay (HOSPITAL_BASED_OUTPATIENT_CLINIC_OR_DEPARTMENT_OTHER): Payer: Medicaid Other | Admitting: Internal Medicine

## 2020-05-27 DIAGNOSIS — C7931 Secondary malignant neoplasm of brain: Secondary | ICD-10-CM

## 2020-05-27 NOTE — Progress Notes (Signed)
I connected with [name] on 05/27/20 at  9:00 AM EDT by telephone visit and verified that I am speaking with the correct person using two identifiers.  I discussed the limitations, risks, security and privacy concerns of performing an evaluation and management service by telemedicine and the availability of in-person appointments. I also discussed with the patient that there may be a patient responsible charge related to this service. The patient expressed understanding and agreed to proceed.  Other persons participating in the visit and their role in the encounter:  n/a  Patient's location:  Home  Provider's location:  Office  Chief Complaint:  Brain Metastases  History of Present Ilness: [name] describes no new or progressive neurologic deficits.  Headaches continue to be sporadic and stable.  Denies seizures.  No recent chemo.  Observations: Cognition and language normal  Imaging:  [facility] Clinician Interpretation: I have personally reviewed the CNS images as listed.  My interpretation, in the context of the patient's clinical presentation, is reduced subdural hematoma  MR BRAIN W WO CONTRAST  Result Date: 05/22/2020 CLINICAL DATA:  History of metastatic breast cancer. EXAM: MRI HEAD WITHOUT AND WITH CONTRAST TECHNIQUE: Multiplanar, multiecho pulse sequences of the brain and surrounding structures were obtained without and with intravenous contrast. CONTRAST:  94mL GADAVIST GADOBUTROL 1 MMOL/ML IV SOLN COMPARISON:  MRI head 01/11/2020 FINDINGS: Brain: Ventricle size normal. Extensive hyperintensity throughout the cerebral white matter bilaterally is unchanged. Negative for acute infarct. Previously noted subdural hematoma on the right has nearly completely resolved with mild residual dural thickening and enhancement in the right parietal region. There are scattered areas of chronic microhemorrhage in the brain bilaterally similar to the prior MRI. Suboccipital craniotomy on the left for  tumor resection. Small area of encephalomalacia left cerebellum is unchanged. No recurrent tumor. Postcontrast imaging demonstrate no enhancing metastatic deposits in the brain. Vascular: Normal arterial flow voids Skull and upper cervical spine: No skeletal lesions. Left suboccipital craniotomy. Sinuses/Orbits: Paranasal sinuses clear.  Negative orbit. Other: None IMPRESSION: Negative for metastatic disease in the brain. Resolving right parietal subdural hematoma with residual dural thickening and enhancement. Extensive white matter changes bilaterally. Chronic microhemorrhage in both cerebral hemisphere. Findings most likely due to prior cancer treatment. Correlate with prior whole-brain radiation. Electronically Signed   By: Franchot Gallo M.D.   On: 05/22/2020 10:29    Assessment and Plan: Clinically and radiographically stable.    Return following next MRI brain in 6 months.  I discussed the assessment and treatment plan with the patient.  The patient was provided an opportunity to ask questions and all were answered.  The patient agreed with the plan and demonstrated understanding of the instructions.    The patient was advised to call back or seek an in-person evaluation if the symptoms worsen or if the condition fails to improve as anticipated.  I provided 5-10 minutes of non-face-to-face time during this enocunter.  Ventura Sellers, MD   I provided 10 minutes of non face-to-face telephone visit time during this encounter, and > 50% was spent counseling as documented under my assessment & plan.

## 2020-05-29 ENCOUNTER — Inpatient Hospital Stay (HOSPITAL_BASED_OUTPATIENT_CLINIC_OR_DEPARTMENT_OTHER): Payer: Medicaid Other | Admitting: Adult Health

## 2020-05-29 ENCOUNTER — Encounter: Payer: Self-pay | Admitting: Adult Health

## 2020-05-29 ENCOUNTER — Other Ambulatory Visit: Payer: Medicaid Other

## 2020-05-29 ENCOUNTER — Inpatient Hospital Stay: Payer: Medicaid Other

## 2020-05-29 ENCOUNTER — Ambulatory Visit: Payer: Medicaid Other | Admitting: Adult Health

## 2020-05-29 ENCOUNTER — Ambulatory Visit: Payer: Medicaid Other

## 2020-05-29 ENCOUNTER — Telehealth: Payer: Self-pay | Admitting: Internal Medicine

## 2020-05-29 ENCOUNTER — Other Ambulatory Visit: Payer: Self-pay

## 2020-05-29 VITALS — BP 106/56 | HR 84 | Temp 98.3°F | Resp 18 | Ht 65.0 in | Wt 140.4 lb

## 2020-05-29 DIAGNOSIS — C50119 Malignant neoplasm of central portion of unspecified female breast: Secondary | ICD-10-CM

## 2020-05-29 DIAGNOSIS — F411 Generalized anxiety disorder: Secondary | ICD-10-CM

## 2020-05-29 DIAGNOSIS — Z95828 Presence of other vascular implants and grafts: Secondary | ICD-10-CM

## 2020-05-29 DIAGNOSIS — Z5112 Encounter for antineoplastic immunotherapy: Secondary | ICD-10-CM | POA: Diagnosis not present

## 2020-05-29 DIAGNOSIS — C7931 Secondary malignant neoplasm of brain: Secondary | ICD-10-CM | POA: Diagnosis not present

## 2020-05-29 DIAGNOSIS — C770 Secondary and unspecified malignant neoplasm of lymph nodes of head, face and neck: Secondary | ICD-10-CM

## 2020-05-29 DIAGNOSIS — D696 Thrombocytopenia, unspecified: Secondary | ICD-10-CM

## 2020-05-29 DIAGNOSIS — C50312 Malignant neoplasm of lower-inner quadrant of left female breast: Secondary | ICD-10-CM | POA: Diagnosis not present

## 2020-05-29 LAB — CBC WITH DIFFERENTIAL/PLATELET
Abs Immature Granulocytes: 0.01 10*3/uL (ref 0.00–0.07)
Basophils Absolute: 0 10*3/uL (ref 0.0–0.1)
Basophils Relative: 0 %
Eosinophils Absolute: 0 10*3/uL (ref 0.0–0.5)
Eosinophils Relative: 0 %
HCT: 34.5 % — ABNORMAL LOW (ref 36.0–46.0)
Hemoglobin: 11.6 g/dL — ABNORMAL LOW (ref 12.0–15.0)
Immature Granulocytes: 0 %
Lymphocytes Relative: 21 %
Lymphs Abs: 0.5 10*3/uL — ABNORMAL LOW (ref 0.7–4.0)
MCH: 30.9 pg (ref 26.0–34.0)
MCHC: 33.6 g/dL (ref 30.0–36.0)
MCV: 92 fL (ref 80.0–100.0)
Monocytes Absolute: 0.3 10*3/uL (ref 0.1–1.0)
Monocytes Relative: 11 %
Neutro Abs: 1.7 10*3/uL (ref 1.7–7.7)
Neutrophils Relative %: 68 %
Platelets: 64 10*3/uL — ABNORMAL LOW (ref 150–400)
RBC: 3.75 MIL/uL — ABNORMAL LOW (ref 3.87–5.11)
RDW: 12.6 % (ref 11.5–15.5)
WBC: 2.5 10*3/uL — ABNORMAL LOW (ref 4.0–10.5)
nRBC: 0 % (ref 0.0–0.2)

## 2020-05-29 LAB — COMPREHENSIVE METABOLIC PANEL
ALT: 22 U/L (ref 0–44)
AST: 30 U/L (ref 15–41)
Albumin: 3.8 g/dL (ref 3.5–5.0)
Alkaline Phosphatase: 85 U/L (ref 38–126)
Anion gap: 8 (ref 5–15)
BUN: 10 mg/dL (ref 6–20)
CO2: 28 mmol/L (ref 22–32)
Calcium: 9.4 mg/dL (ref 8.9–10.3)
Chloride: 98 mmol/L (ref 98–111)
Creatinine, Ser: 1.04 mg/dL — ABNORMAL HIGH (ref 0.44–1.00)
GFR calc Af Amer: >60 mL/min (ref >60)
GFR calc non Af Amer: 59 mL/min — ABNORMAL LOW (ref >60)
Glucose, Bld: 94 mg/dL (ref 70–99)
Potassium: 4 mmol/L (ref 3.5–5.1)
Sodium: 134 mmol/L — ABNORMAL LOW (ref 135–145)
Total Bilirubin: 0.3 mg/dL (ref 0.3–1.2)
Total Protein: 6.8 g/dL (ref 6.5–8.1)

## 2020-05-29 MED ORDER — ACETAMINOPHEN 325 MG PO TABS
650.0000 mg | ORAL_TABLET | Freq: Once | ORAL | Status: AC
  Administered 2020-05-29: 650 mg via ORAL

## 2020-05-29 MED ORDER — TRASTUZUMAB-DKST CHEMO 150 MG IV SOLR
6.0000 mg/kg | Freq: Once | INTRAVENOUS | Status: AC
  Administered 2020-05-29: 399 mg via INTRAVENOUS
  Filled 2020-05-29: qty 19

## 2020-05-29 MED ORDER — DENOSUMAB 120 MG/1.7ML ~~LOC~~ SOLN
120.0000 mg | Freq: Once | SUBCUTANEOUS | Status: AC
  Administered 2020-05-29: 120 mg via SUBCUTANEOUS

## 2020-05-29 MED ORDER — SODIUM CHLORIDE 0.9% FLUSH
10.0000 mL | Freq: Once | INTRAVENOUS | Status: AC
  Administered 2020-05-29: 10 mL
  Filled 2020-05-29: qty 10

## 2020-05-29 MED ORDER — ACETAMINOPHEN 325 MG PO TABS
ORAL_TABLET | ORAL | Status: AC
  Filled 2020-05-29: qty 2

## 2020-05-29 MED ORDER — SODIUM CHLORIDE 0.9% FLUSH
10.0000 mL | INTRAVENOUS | Status: DC | PRN
  Administered 2020-05-29: 10 mL
  Filled 2020-05-29: qty 10

## 2020-05-29 MED ORDER — HEPARIN SOD (PORK) LOCK FLUSH 100 UNIT/ML IV SOLN
500.0000 [IU] | Freq: Once | INTRAVENOUS | Status: AC | PRN
  Administered 2020-05-29: 500 [IU]
  Filled 2020-05-29: qty 5

## 2020-05-29 MED ORDER — ACETAMINOPHEN 325 MG PO TABS
ORAL_TABLET | ORAL | Status: AC
  Filled 2020-05-29: qty 1

## 2020-05-29 MED ORDER — SODIUM CHLORIDE 0.9 % IV SOLN
Freq: Once | INTRAVENOUS | Status: AC
  Filled 2020-05-29: qty 250

## 2020-05-29 NOTE — Telephone Encounter (Signed)
No 6/28 los 

## 2020-05-29 NOTE — Progress Notes (Signed)
[facility]  [phone] [phone]     ID: [name] DOB: [birth year]  MR#: [id number]  QPR#:[id number]  Patient Care Team: Charlott Rakes, MD as PCP - General (Family Medicine) Kyung Rudd, MD as Consulting Physician (Radiation Oncology) Erline Levine, MD as Consulting Physician (Neurosurgery) Corena Pilgrim, MD as Consulting Physician (Psychiatry) Mickeal Skinner Acey Lav, MD as Consulting Physician (Psychiatry) Nehemiah Settle, MD as Referring Physician (Plastic Surgery) Irene Limbo, MD as Consulting Physician (Plastic Surgery) Dillingham, Loel Lofty, DO as Attending Physician (Plastic Surgery) Magrinat, Virgie Dad, MD as Consulting Physician (Oncology)   CHIEF COMPLAINT: Metastatic triple positive breast cancer  CURRENT TREATMENT: trastuzumab [every 21 days], denosumab/Xgeva (Q6W), anastrozole   INTERVAL HISTORY: [name] returns today for follow up and treatment of her metastatic triple positive breast cancer.  She continues on trastuzumab, given every 21 days.  We switched back to this for maintenance after she took the TDM 1 for 8 months. Her most recent echocardiogram on 4/12/2021and showed an LVEF of 65-70%.  Her echocardiograms are repeated every 6 months.    She is tolerating denosumab/Xgeva every 6 weeks with no side effects that she is aware of.    She also does generally well with anastrozole.  She has some vaginal dryness and uses coconut oil for intercourse.    Since her last visit she underwent brain MRI that was negative for metastatic disease on 05/22/2020.  She is scheduled for CT chest and bone scan on 06/10/2020.    She had f/u with Dr. Mickeal Skinner on 05/27/2020 who recommended repeat brain MRI and f/u in 6 months.   REVIEW OF SYSTEMS: [name] notes she is feeling well today.  She has some slowed thinking, and she notes that she talked to Dr. Mickeal Skinner about this.  He reviewed with her that this isn't unusual for her h/o brain radiation.      [name] notes an increase in her chronic right hip and sciatic pain lately.  She is taking Ibuprofen and Flexeril as needed for this.    At one of her recent appointments [name] was having increased nasal allergies and ear pain.  I sent in flonase and astelin nasal spray.  She notes that since starting this she has noted a significant improvement in her allergies.    [name] remains active and her appetite and weight remain stable.  She denies any new issues today such as fever, chills, chest pain, palpitations, cough, bladder changes.  She remains constipated and is managing this by taking Miralax every other day.  She is overall feeling well and a detailed ROS was otherwise non contributory.    BREAST CANCER HISTORY: From the original intake note:  [name] was aware of a "lemon sized lump in" her left axilla for about a year before bringing it to medical attention. By then she had developed left breast and left axillary swelling (June 2016). She presented to the local emergency room and had a chest CT scan 06/06/2015 which showed a nodule in the left breast measuring 0.9 cm and questionable left axillary adenopathy. She then proceeded to bilateral diagnostic mammography and left breast ultrasonography 06/19/2015. There were no prior films for comparison (last mammography 12 years prior).. The breast density was category C. Mammography showed in the left breast upper inner quadrant a 7 cm area including a small mass and significant pleomorphic calcifications. Ultrasonography defined the mass as measuring 1.2 cm. The left axilla appeared unremarkable. There was significant skin edema.  Biopsy of the left  breast mass 06/19/2015 showed (SP [id number]) an invasive ductal carcinoma, grade 2, estrogen receptor 83% positive, progesterone receptor 26% positive, and HER-2 amplified by immunohistochemstry with a 3+ reading. The patient had biopsies of a separate area in the left breast August of the same year and this  showed atypical ductal hyperplasia. (SP [id number]).  Accordingly after appropriate discussion on 08/21/2015 the patient proceeded to left mastectomy with left axillary sentinel lymph node sampling, which, since the lymph nodes were positive, extended to the procedure to left axillary lymph node dissection. The pathology (SP [id number]) showed an invasive ductal carcinoma, grade 3, measuring in excess of 9 cm. There were also skin satellites, not contiguous with the invasive carcinoma. Margins were clear and ample. There was evidence of lymphovascular invasion. A total of 15 lymph nodes were removed, including 5 sentinel lymph nodes, all of which were positive, so that the final total was 14 out of 15 lymph nodes involved by tumor. There was evidence of extranodal extension. The final pathology was pT4b pN2a, stage IIIB  CA-27-29 and CEA 09/19/2015 were non-informative October 2016.  Unfortunately CT scans of the chest abdomen and pelvis 09/16/2015 showed bony metastases to the ri/ght scapula, left iliac crest, and also L4 and T-spine. There were questionable liver cysts which on repeat CT scan 03/02/2016 appear to be a little bit more well-defined, possibly a little larger. There were also some possible right upper lobe lung lesions.  Adjuvant treatment consisted of docetaxel, trastuzumab and pertuzumab, with the final (6th) docetaxel dose given 02/11/2016. She continues on trastuzumab and pertuzumab, with the 11th cycle given 05/05/2016. Echocardiogram 02/26/2016 showed an ejection fraction of 55%. She receives denosumab/Xgeva every 4 weeks.. She also receives radiation, started 06/09/201, to be completed 06/26/2016.  Her subsequent history is as detailed below   PAST MEDICAL HISTORY: Past Medical History:  Diagnosis Date  . Alcohol abuse    none since 2013  . Anemia    during chemo  . Anxiety    At age 82  . Arthritis Dx 2010  . Bipolar disorder ([place])   . Bronchitis   . Cancer ([facility] [place])     breast mets to brain  . Chronic pain    resolved per patient 12/25/19  . Complication of anesthesia   . Depression   . Family history of adverse reaction to anesthesia    MOther had PONV  . GERD (gastroesophageal reflux disease)   . Headache    hx  migraines  . Lymphedema of left arm   . Opiate dependence ([place])   . PONV (postoperative nausea and vomiting)   . Port-A-Cath in place   . PTSD (post-traumatic stress disorder)   . S/P endometrial ablation    in md's office    PAST SURGICAL HISTORY: Past Surgical History:  Procedure Laterality Date  . APPLICATION OF CRANIAL NAVIGATION N/A 08/14/2016   Procedure: APPLICATION OF CRANIAL NAVIGATION;  Surgeon: Erline Levine, MD;  Location: [facility];  Service: Neurosurgery;  Laterality: N/A;  . BREAST RECONSTRUCTION Left    with silicone implant  . COLONOSCOPY W/ POLYPECTOMY    . CRANIOTOMY N/A 08/14/2016   Procedure: CRANIOTOMY TUMOR EXCISION WITH Lucky Rathke;  Surgeon: Erline Levine, MD;  Location: [facility];  Service: Neurosurgery;  Laterality: N/A;  . FIBULA FRACTURE SURGERY Left   . MASTECTOMY Left   . RADIOLOGY WITH ANESTHESIA N/A 07/23/2016   Procedure: MRI OF BRAIN WITH AND WITHOUT;  Surgeon: Medication Radiologist, MD;  Location: [facility];  Service: Radiology;  Laterality: N/A;  . RADIOLOGY WITH ANESTHESIA N/A 09/08/2016   Procedure: MRI OF BRAIN WITH AND WITHOUT CONTRAST;  Surgeon: Medication Radiologist, MD;  Location: [facility];  Service: Radiology;  Laterality: N/A;  . RADIOLOGY WITH ANESTHESIA N/A 12/10/2016   Procedure: MRI OF BRAIN WITH AND WITHOUT;  Surgeon: Medication Radiologist, MD;  Location: [facility];  Service: Radiology;  Laterality: N/A;  . RADIOLOGY WITH ANESTHESIA N/A 03/02/2017   Procedure: MRI of BRAIN W and W/OUT CONTRAST;  Surgeon: Medication Radiologist, MD;  Location: [facility];  Service: Radiology;  Laterality: N/A;  . RADIOLOGY WITH ANESTHESIA N/A 07/29/2017   Procedure: RADIOLOGY WITH ANESTHESIA MRI OF BRAIN WITH AND  WITHOUT CONTRAST;  Surgeon: Radiologist, Medication, MD;  Location: [facility];  Service: Radiology;  Laterality: N/A;  . RADIOLOGY WITH ANESTHESIA N/A 12/07/2017   Procedure: MRI WITH ANESTHESIA OF BRAIN WITH AND WITHOUT CONTRAST;  Surgeon: Radiologist, Medication, MD;  Location: [facility];  Service: Radiology;  Laterality: N/A;  . RADIOLOGY WITH ANESTHESIA N/A 04/07/2018   Procedure: MRI OF BRAIN WITH AND WITHOUT CONTRAST;  Surgeon: Radiologist, Medication, MD;  Location: [facility];  Service: Radiology;  Laterality: N/A;  . RADIOLOGY WITH ANESTHESIA N/A 08/23/2018   Procedure: MRI WITH ANESTHESIA OF THE BRAIN WITH AND WITHOUT;  Surgeon: Radiologist, Medication, MD;  Location: [facility];  Service: Radiology;  Laterality: N/A;  . RADIOLOGY WITH ANESTHESIA N/A 01/24/2019   Procedure: MRI OF BRAIN WITH AND WITHOUT CONTRAST;  Surgeon: Radiologist, Medication, MD;  Location: [facility];  Service: Radiology;  Laterality: N/A;  . RADIOLOGY WITH ANESTHESIA N/A 07/06/2019   Procedure: MRI WITH ANESTHESIA OF BRAIN WITH AND WITHOUT CONTRAST;  Surgeon: Radiologist, Medication, MD;  Location: [facility];  Service: Radiology;  Laterality: N/A;  . RADIOLOGY WITH ANESTHESIA N/A 01/11/2020   Procedure: MRI WITH ANESTHESIA BRAIN WITH AND WITHOUT;  Surgeon: Radiologist, Medication, MD;  Location: [facility];  Service: Radiology;  Laterality: N/A;  . RADIOLOGY WITH ANESTHESIA N/A 05/21/2020   Procedure: MRI WITH ANESTHESIA OF BRAIN WITH AND WITHOUT CONTRAST;  Surgeon: Radiologist, Medication, MD;  Location: [facility];  Service: Radiology;  Laterality: N/A;  . right power port placement Right     FAMILY HISTORY Family History  Problem Relation Age of Onset  . Diabetes Mother   . Bipolar disorder Mother   . CAD Father    The patient's father still living, age 40, in [place]. He had prostate cancer at some point in the past. The patient's mother died at age 30 from complications of diabetes. The patient had no brothers, 2 sisters. A paternal  grandmother had lung cancer in the setting of tobacco abuse. There is no other history of cancer in the family to her knowledge   GYNECOLOGIC HISTORY:  No LMP recorded. Patient has had an ablation. Menarche approximately age 34. First live birth in 29. The patient is GX P2. She underwent endometrial ablation in 2016.   SOCIAL HISTORY: Updated August 2019 The patient is originally from [place]. She has lived in [place] before but more recently was in [place]. She is back here because she could not afford her rent in [place]. She is divorced. Her 2 children are [name] who lives in [place] and works as a Development worker, community, and [name] who also lives in [place] and works as a Catering manager. The patient has a grandchild, Arelia Longest, 76 years old as of July 2019, living in [place] with his mother. The patient also has a grandson born in February 2019, who lives in  [place]. The patient has not established herself with a local church yet.    ADVANCED DIRECTIVES: Not in place; at the 06/03/2016 visit the patient was given the appropriate forms to complete and notarize at her discretion   HEALTH MAINTENANCE: Social History   Tobacco Use  . Smoking status: Current Every Day Smoker    Packs/day: 1.00    Years: 43.00    Pack years: 43.00    Types: Cigarettes  . Smokeless tobacco: Never Used  Vaping Use  . Vaping Use: Some days  Substance Use Topics  . Alcohol use: No    Comment: no ETOH since 08/22/12  . Drug use: No    Comment: states she's in recovery program for 7 years     Colonoscopy:  PAP:  Bone density:   Allergies  Allergen Reactions  . Demerol [Meperidine Hcl] Itching and Nausea And Vomiting    INTOLERANCE >  N & V  . Erythromycin Rash    Current Outpatient Medications on File Prior to Visit  Medication Sig Dispense Refill  . acetaminophen (TYLENOL) 500 MG tablet Take 1,000 mg by mouth every 6 (six) hours as needed for moderate pain.    [name] anastrozole (ARIMIDEX) 1 MG  tablet TAKE 1 TABLET BY MOUTH EVERY DAY 90 tablet 1  . azelastine (ASTELIN) 0.1 % nasal spray Place 2 sprays into both nostrils 2 (two) times daily. Use in each nostril as directed 30 mL 12  . Cholecalciferol (VITAMIN D3) 250 MCG (10000 UT) capsule Take 10,000 Units by mouth daily.     . cyclobenzaprine (FLEXERIL) 10 MG tablet TAKE 1 TABLET BY MOUTH TWICE A DAY 60 tablet 1  . cycloSPORINE (RESTASIS) 0.05 % ophthalmic emulsion Place 1 drop into both eyes 2 (two) times daily as needed (dry eyes).     . fluticasone (FLONASE) 50 MCG/ACT nasal spray SPRAY 2 SPRAYS INTO EACH NOSTRIL EVERY DAY (Patient taking differently: Place 2 sprays into both nostrils daily. ) 16 mL 2  . gabapentin (NEURONTIN) 300 MG capsule Take 1-2 capsules (300-600 mg total) by mouth See admin instructions. Take 300 mg by mouth in the morning and take 600 mg by mouth at bedtime 90 capsule 0  . ibuprofen (ADVIL) 800 MG tablet TAKE 1 TABLET BY MOUTH THREE TIMES A DAY (Patient taking differently: Take 800 mg by mouth every 8 (eight) hours as needed for moderate pain. ) 90 tablet 0  . lamoTRIgine (LAMICTAL) 100 MG tablet Take 100 mg by mouth every morning.    . loratadine (CLARITIN) 10 MG tablet TAKE 1 TABLET BY MOUTH EVERY DAY (Patient taking differently: Take 10 mg by mouth daily. ) 30 tablet 6  . Lurasidone HCl (LATUDA) 60 MG TABS Take 60 mg by mouth daily.    . ondansetron (ZOFRAN) 8 MG tablet Take 1 tablet (8 mg total) by mouth every 8 (eight) hours as needed for nausea or vomiting. 90 tablet 1  . pantoprazole (PROTONIX) 40 MG tablet Take 1 tablet (40 mg total) by mouth daily. 90 tablet 1  . traZODone (DESYREL) 100 MG tablet Take 1 tablet (100 mg total) by mouth at bedtime. (Patient taking differently: Take 300 mg by mouth at bedtime. ) 30 tablet 0   No current facility-administered medications on file prior to visit.    OBJECTIVE:  Vitals:   05/29/20 0824  BP: (!) 106/56  Pulse: 84  Resp: 18  Temp: 98.3 F (36.8 C)    TempSrc: Oral  SpO2: 99%  Weight: 140  lb 6.4 oz (63.7 kg)  Height: 5' 5"  (1.651 m)  Body mass index is 23.36 kg/m.  ECOG FS: 1 - Symptomatic but completely ambulatory  GENERAL: Patient is a well appearing female in no acute distress HEENT:  Sclerae anicteric.  Mask in place. Neck is supple.  NODES:  No cervical, supraclavicular, or axillary lymphadenopathy palpated.  BREAST EXAM:  Deferred today LUNGS:  Clear to auscultation bilaterally.  No wheezes or rhonchi. HEART:  Regular rate and rhythm. No murmur appreciated. ABDOMEN:  Soft, nontender.  Positive, normoactive bowel sounds. No organomegaly palpated. MSK:  No focal spinal tenderness to palpation. Full range of motion bilaterally in the upper extremities. EXTREMITIES: + left arm lymphedema present and stable SKIN:  Clear with no obvious rashes or skin changes. No nail dyscrasia. NEURO:  Nonfocal. Well oriented.  Appropriate affect.    LAB RESULTS: No results found for: LABCA2  CBC    Component Value Date/Time   WBC 2.5 (L) 05/29/2020 0805   RBC 3.75 (L) 05/29/2020 0805   HGB 11.6 (L) 05/29/2020 0805   HGB 13.5 07/05/2019 1129   HGB 12.1 10/26/2017 1038   HCT 34.5 (L) 05/29/2020 0805   HCT 35.7 10/26/2017 1038   PLT 64 (L) 05/29/2020 0805   PLT 54 (L) 07/05/2019 1129   PLT 167 10/26/2017 1038   MCV 92.0 05/29/2020 0805   MCV 98.4 10/26/2017 1038   MCH 30.9 05/29/2020 0805   MCHC 33.6 05/29/2020 0805   RDW 12.6 05/29/2020 0805   RDW 13.3 10/26/2017 1038   LYMPHSABS 0.5 (L) 05/29/2020 0805   LYMPHSABS 0.5 (L) 10/26/2017 1038   MONOABS 0.3 05/29/2020 0805   MONOABS 0.1 10/26/2017 1038   EOSABS 0.0 05/29/2020 0805   EOSABS 0.0 10/26/2017 1038   BASOSABS 0.0 05/29/2020 0805   BASOSABS 0.0 10/26/2017 1038   CMP Latest Ref Rng & Units 05/29/2020 05/08/2020 04/16/2020  Glucose 70 - 99 mg/dL 94 97 95  BUN 6 - 20 mg/dL 10 8 10   Creatinine 0.44 - 1.00 mg/dL 1.04(H) 0.98 0.94  Sodium 135 - 145 mmol/L 134(L) 140 140   Potassium 3.5 - 5.1 mmol/L 4.0 4.1 4.4  Chloride 98 - 111 mmol/L 98 102 103  CO2 22 - 32 mmol/L 28 24 26   Calcium 8.9 - 10.3 mg/dL 9.4 9.6 8.9  Total Protein 6.5 - 8.1 g/dL 6.8 6.8 6.6  Total Bilirubin 0.3 - 1.2 mg/dL 0.3 0.2(L) 0.4  Alkaline Phos 38 - 126 U/L 85 75 76  AST 15 - 41 U/L 30 34 36  ALT 0 - 44 U/L 22 28 28      STUDIES: MR BRAIN W WO CONTRAST  Result Date: 05/22/2020 CLINICAL DATA:  History of metastatic breast cancer. EXAM: MRI HEAD WITHOUT AND WITH CONTRAST TECHNIQUE: Multiplanar, multiecho pulse sequences of the brain and surrounding structures were obtained without and with intravenous contrast. CONTRAST:  22m GADAVIST GADOBUTROL 1 MMOL/ML IV SOLN COMPARISON:  MRI head 01/11/2020 FINDINGS: Brain: Ventricle size normal. Extensive hyperintensity throughout the cerebral white matter bilaterally is unchanged. Negative for acute infarct. Previously noted subdural hematoma on the right has nearly completely resolved with mild residual dural thickening and enhancement in the right parietal region. There are scattered areas of chronic microhemorrhage in the brain bilaterally similar to the prior MRI. Suboccipital craniotomy on the left for tumor resection. Small area of encephalomalacia left cerebellum is unchanged. No recurrent tumor. Postcontrast imaging demonstrate no enhancing metastatic deposits in the brain. Vascular: Normal arterial flow voids Skull  and upper cervical spine: No skeletal lesions. Left suboccipital craniotomy. Sinuses/Orbits: Paranasal sinuses clear.  Negative orbit. Other: None IMPRESSION: Negative for metastatic disease in the brain. Resolving right parietal subdural hematoma with residual dural thickening and enhancement. Extensive white matter changes bilaterally. Chronic microhemorrhage in both cerebral hemisphere. Findings most likely due to prior cancer treatment. Correlate with prior whole-brain radiation. Electronically Signed   By: Franchot Gallo M.D.   On:  05/22/2020 10:29    ELIGIBLE FOR AVAILABLE RESEARCH PROTOCOL: no  ASSESSMENT: 59 y.o. [place] woman with stage IV left-sided breast cancer involving bone and central nervous system  (1) s/p left breast lower inner quadrant biopsy 06/19/2015 for a clinical T2-3 NX invasive ductal carcinoma, grade 2, triple positive.  (2) status post left mastectomy and axillary lymph node dissection  with immediate expander placement 07/18/2015 for an mpT4 pN2,stage IIIB invasive ductal carcinoma, grade 3, with negative margins.  (a) definitive implant exchange to be scheduled in December   METASTATIC DISEASE: October 2016  (3) CT scan of the chest abdomen and pelvis  09/16/2015 shows metastatic lesions in the right scapula, left iliac crest, L4, and T spine. There were questionable liver cysts, with repeat CT scan 03/02/2016 showing possible right upper lobe lung lesions and possibly increased liver lesions  (a) CT scan of the chest 06/17/2016 shows no active disease in the lungs or liver  (b) Bone scan July 2017 showed no evidence of bony metastatic disease   (c) head CT 07/08/2016 showed a cerebellar lesion, confirmed by MRI 07/23/2016, status post craniotomy 08/14/2016, confirming a metastatic deposit which was estrogen and progesterone receptor negative, HER-2 amplified with a signals ratio of 7.16, number per cell 13.25  (d) CA 27-29 is not informative  (4) received docetaxel every 3 weeks 6 together with trastuzumab and pertuzumab, last docetaxel dose 02/11/2016  (5) adjuvant radiation7/03/2016 to 06/26/2016 at [facility]: 1. The Left chest wall was treated to 23.4 Gy in 13 fractions at 1.8 Gy per fraction. 2. The Left chest wall was boosted to 10 Gy in 5 fractions at 2 Gy per fraction. 3. The Left Sclav/PAB was treated to 23.4 Gy in 13 fractions at 1.8 Gy per fraction.  [Note: Including the patient's treatment in [place] (received 15 fractions per Dr. Maryan Rued near [place], [place]), the patient  received 50.4 Gy to the left chest wall and supraclavicular region. ]  (6) started trastuzumab and pertuzumab October 2016, continuing every 3 weeks,  (a) echocardiogram 02/26/2016 showed a well preserved ejection fraction  (b) echocardiogram 07/01/2016 shows an ejection fraction in the 60-65%   (c) pertuzumab discontinued 10/2016 with uncontrolled diarrhea  (d) echocardiogram 11/11/2016 showed an ejection fraction in the 60-65%  (e) echocardiogram 03/03/2017 shows an ejection fraction of 60-65%  (f) echocardiogram on 05/19/2017 shows an ejection fraction of 55-60%  (g) echocardiogram 09/24/2017 shows the ejection fraction in the 60-65%  (h) echocardiogram 02/14/2018 shows an ejection fraction in the 60-65%  (I) echocardiogram  06/30/2018 shows an ejection fraction in the 55-60%  (m) echocardiogram on 12/08/2018 shows an ejection fraction in the 60-65% range  (7) started denosumab/Xgeva October 2017 given every 4 weeks, transitioned to every 8 weeks beginning 10/11/18 (every 6 weeks while giving TDM1 every 3 weeks)  (8) started anastrozole October 2017   (a) bone scan 11/10/2016 shows no active disease  (b) chest CT scan 11/10/2016 stable, with no evidence of active disease  (c) chest CT and bone scan 07/02/2017 show no evidence of active disease  (  d) CT scan of the chest with contrast 11/10/2017 shows some left axillary edema, but no evidence of thrombosis or adenopathy in that area, 0.9 cm precarinal lymph and 0.7 cm right upper lobe nodule node; bone lesions stable  (e) CT of the chest 05/04/2018 shows a 1.4 cm right lower paratracheal node which is slightly increased and a new right prevascular mediastinal node measuring 0.7 cm.  Bone lesions are stable.  (f) chest CT on 07/01/2018 shows no definite findings of metastatic disease in the thorax. Previously noted borderline enlarged low right paratracheal lymph node is stable to slightly decreased in size   (e) chest CT on 12/30/2018 notes mild  increase in right paratracheal adneopathy, recommended PET scan.  Pet scan on 01/19/2019 showed a hypermetabolic and enlarged right paratracheal lymph node consistent with breast cancer recurrence.  (f)Trastuzumab discontinued due to February 2020 scans   (g) TDM-1 started on 01/31/2019 given every 21 days.  (h) Chest CT 05/15/2019 shows decrease in mediastinal adenopathy  (i) CT chest on 08/17/2019 shows resolution of thoracic adenopathy  (j) TDM 1 discontinued after 10/11/2019 dose (8 months treatment) because of thrombocytopenia  (k) trastuzumab resumed 11/01/2019, repeated every 21 days  (l) echocardiogram 12/06/2019 showed an ejection fraction in the 60-65% range  (m) chest CT scan, brain CT and bone scan 01/31/2020 show no evidence of active disease  (9) history of bipolar disorder  (a) currently on Lamictal and Latuda as well as Desiree L and Neurontin  (10) mild anemia with a significant drop in the MCV, ferritin 10 06/03/2016,   (a) Feraheme given 06/12/2016 and 06/18/2016  (11) tobacco abuse: Chantix started 06/18/2016--she is not currently trying to quit   (12) 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  (a) brain irradiation10/19/17-11/08/17: Whole brain/ 35 Gy in 14 fractions   (b) repeat brain MRI obtained 12/10/2016 shows no active disease in the brain  (c) repeat brain MRI 03/02/2017 shows no evidence of residual or recurrent disease  (d) repeat brain MRI 07/29/2017 shows no evidence of residual or recurrent disease  (e) repeat brain MRI 12/07/2017 shows no evidence of disease recurrence.  There is progressive white matter change secondary to prior treatment.  (f) repeat brain MRI 04/07/2018 showed no evidence of disease\  (g) repeat brain MRI on 08/23/2018 shows no evidence of disease  (h) repeat brain MRI on 01/25/2019 shows no evidence of disease  (I) brain MRI 07/06/2019 shows no evidence of active disease  (j) brain MRI 01/11/2020 shows  a subdural hematoma measuring 0.8 cm but no evidence of recurrent metastatic disease   PLAN: [name] is doing well today.  She has no clinical signs of breast cancer progression.  I reviewed her brain MRI and f/u with Dr. Mickeal Skinner with her.  She will undergo repeat MRI in 6 months time.    [name] and I discussed her back pain.  She will continue her current management with Ibuprofen and Flexeril.  She is tolerating this well.  She will undergo bone scan in less than 2 weeks.    [name] will continue miralax every other day for her constipation.  I am happy that the Astelin and Flonase I prescribed for her helped her allergies.    I recommended that she remain active, and continue with healthy diet.  Her weight is stable.    [name] and I reviewed her treatment.  She will be due for her restaging in a week and a half, in 3 weeks we  will have her come back for labs and f/u with Dr. [name].    She knows to call for any questions that may arise between now and her next appointment.  We are happy to see her sooner if needed.    Total encounter time 20 minutes.Wilber Bihari, NP 05/29/20 8:53 AM Medical Oncology and Hematology [facility] [place], [place] [address] Tel. [phone]    Fax. [phone]   *Total Encounter Time as defined by the Centers for Medicare and Medicaid Services includes, in addition to the face-to-face time of a patient visit (documented in the note above) non-face-to-face time: obtaining and reviewing outside history, ordering and reviewing medications, tests or procedures, care coordination (communications with other health care professionals or caregivers) and documentation in the medical record.

## 2020-05-29 NOTE — Patient Instructions (Signed)
[facility] Discharge Instructions for Patients Receiving Chemotherapy  Today you received the following chemotherapy agents trastuzumab.  To help prevent nausea and vomiting after your treatment, we encourage you to take your nausea medication as directed.    If you develop nausea and vomiting that is not controlled by your nausea medication, call the clinic.   BELOW ARE SYMPTOMS THAT SHOULD BE REPORTED IMMEDIATELY:  *FEVER GREATER THAN 100.5 F  *CHILLS WITH OR WITHOUT FEVER  NAUSEA AND VOMITING THAT IS NOT CONTROLLED WITH YOUR NAUSEA MEDICATION  *UNUSUAL SHORTNESS OF BREATH  *UNUSUAL BRUISING OR BLEEDING  TENDERNESS IN MOUTH AND THROAT WITH OR WITHOUT PRESENCE OF ULCERS  *URINARY PROBLEMS  *BOWEL PROBLEMS  UNUSUAL RASH Items with * indicate a potential emergency and should be followed up as soon as possible.  Feel free to call the clinic should you have any questions or concerns. The clinic phone number is [phone].  Please show the CHEMO ALERT CARD at check-in to the Emergency Department and triage nurse.   

## 2020-05-30 ENCOUNTER — Telehealth: Payer: Self-pay | Admitting: Adult Health

## 2020-05-30 NOTE — Telephone Encounter (Signed)
Scheduled appts per 7/1 los. Pt confirmed appt dates and times.

## 2020-06-02 ENCOUNTER — Other Ambulatory Visit: Payer: Self-pay | Admitting: Family Medicine

## 2020-06-02 DIAGNOSIS — J302 Other seasonal allergic rhinitis: Secondary | ICD-10-CM

## 2020-06-02 NOTE — Telephone Encounter (Signed)
Requested Prescriptions  Pending Prescriptions Disp Refills  . fluticasone (FLONASE) 50 MCG/ACT nasal spray [Pharmacy Med Name: FLUTICASONE PROP 50 MCG SPRAY] 16 mL 2    Sig: SPRAY 2 SPRAYS INTO EACH NOSTRIL EVERY DAY     Ear, Nose, and Throat: Nasal Preparations - Corticosteroids Passed - 06/02/2020  7:44 AM      Passed - Valid encounter within last 12 months    Recent Outpatient Visits          4 months ago Acute non-recurrent sinusitis of other sinus   [place], [name], MD   6 months ago Other constipation   [facility] Charlott Rakes, MD   1 year ago Metastatic breast cancer [facility])   [place], Enobong, MD   1 year ago Seasonal allergic rhinitis, unspecified trigger   [place], Patrick E, MD   1 year ago Bacterial vaginosis   [facility] Charlott Rakes, MD

## 2020-06-03 ENCOUNTER — Other Ambulatory Visit: Payer: Self-pay | Admitting: Oncology

## 2020-06-10 ENCOUNTER — Other Ambulatory Visit: Payer: Self-pay

## 2020-06-10 ENCOUNTER — Ambulatory Visit (HOSPITAL_COMMUNITY)
Admission: RE | Admit: 2020-06-10 | Discharge: 2020-06-10 | Disposition: A | Discharge status: Home / Self Care | Payer: Medicaid Other | Source: Ambulatory Visit | Attending: Oncology | Admitting: Oncology

## 2020-06-10 ENCOUNTER — Encounter (HOSPITAL_COMMUNITY): Payer: Self-pay

## 2020-06-10 ENCOUNTER — Encounter: Payer: Self-pay | Admitting: Oncology

## 2020-06-10 DIAGNOSIS — C7931 Secondary malignant neoplasm of brain: Secondary | ICD-10-CM

## 2020-06-10 DIAGNOSIS — C50011 Malignant neoplasm of nipple and areola, right female breast: Secondary | ICD-10-CM

## 2020-06-10 DIAGNOSIS — C770 Secondary and unspecified malignant neoplasm of lymph nodes of head, face and neck: Secondary | ICD-10-CM | POA: Insufficient documentation

## 2020-06-10 DIAGNOSIS — C50312 Malignant neoplasm of lower-inner quadrant of left female breast: Secondary | ICD-10-CM

## 2020-06-10 DIAGNOSIS — C50012 Malignant neoplasm of nipple and areola, left female breast: Secondary | ICD-10-CM | POA: Insufficient documentation

## 2020-06-10 DIAGNOSIS — C7951 Secondary malignant neoplasm of bone: Secondary | ICD-10-CM | POA: Insufficient documentation

## 2020-06-10 MED ORDER — TECHNETIUM TC 99M MEDRONATE IV KIT
20.0000 | PACK | Freq: Once | INTRAVENOUS | Status: AC | PRN
  Administered 2020-06-10: 20.8 via INTRAVENOUS

## 2020-06-10 MED ORDER — SODIUM CHLORIDE (PF) 0.9 % IJ SOLN
INTRAMUSCULAR | Status: AC
  Filled 2020-06-10: qty 50

## 2020-06-10 MED ORDER — IOHEXOL 300 MG/ML  SOLN
75.0000 mL | Freq: Once | INTRAMUSCULAR | Status: AC | PRN
  Administered 2020-06-10: 75 mL via INTRAVENOUS

## 2020-06-11 ENCOUNTER — Encounter: Payer: Self-pay | Admitting: Oncology

## 2020-06-16 NOTE — Progress Notes (Deleted)
[place]   [phone]) [phone] Fax:([phone]) [phone]   Clinic Follow up Note   Patient Care Team: Charlott Rakes, MD as PCP - General (Family Medicine) Kyung Rudd, MD as Consulting Physician (Radiation Oncology) Erline Levine, MD as Consulting Physician (Neurosurgery) Corena Pilgrim, MD as Consulting Physician (Psychiatry) Mickeal Skinner Acey Lav, MD as Consulting Physician (Psychiatry) Nehemiah Settle, MD as Referring Physician (Plastic Surgery) Irene Limbo, MD as Consulting Physician (Plastic Surgery) Dillingham, Loel Lofty, DO as Attending Physician (Plastic Surgery) Magrinat, Virgie Dad, MD as Consulting Physician (Oncology) 06/16/2020  CHIEF COMPLAINT:   SUMMARY OF ONCOLOGIC HISTORY: Oncology History  Primary cancer of lower-inner quadrant of left female breast ([place])  06/01/2016 Initial Diagnosis   Primary cancer of lower-inner quadrant of left female breast ([place])   01/31/2019 - 10/31/2019 Chemotherapy   The patient had ado-trastuzumab emtansine (KADCYLA) 260 mg in sodium chloride 0.9 % 250 mL chemo infusion, 3.4 mg/kg = 280 mg, Intravenous, Once, 12 of 18 cycles Administration: 260 mg (01/31/2019), 260 mg (02/28/2019), 260 mg (05/02/2019), 260 mg (03/21/2019), 260 mg (04/11/2019), 260 mg (05/23/2019), 260 mg (06/20/2019), 260 mg (07/18/2019), 260 mg (08/09/2019), 260 mg (08/30/2019), 260 mg (09/19/2019), 260 mg (10/11/2019)  for chemotherapy treatment.    11/01/2019 -  Chemotherapy   The patient had trastuzumab-dkst (OGIVRI) 399 mg in sodium chloride 0.9 % 250 mL chemo infusion, 6 mg/kg = 399 mg (100 % of original dose 6 mg/kg), Intravenous,  Once, 9 of 12 cycles Dose modification: 6 mg/kg (original dose 6 mg/kg, Cycle 1, Reason: Provider Judgment) Administration: 399 mg (11/01/2019), 399 mg (12/12/2019), 399 mg (03/05/2020), 399 mg (01/09/2020), 399 mg (02/13/2020), 399 mg (03/28/2020), 399 mg (04/16/2020), 399 mg (05/08/2020), 399 mg (05/29/2020)  for chemotherapy treatment.      Malignant neoplasm of female breast ([place])  04/26/2017 Initial Diagnosis   Metastatic breast cancer ([place])   01/31/2019 - 10/31/2019 Chemotherapy   The patient had ado-trastuzumab emtansine (KADCYLA) 260 mg in sodium chloride 0.9 % 250 mL chemo infusion, 3.4 mg/kg = 280 mg, Intravenous, Once, 12 of 18 cycles Administration: 260 mg (01/31/2019), 260 mg (02/28/2019), 260 mg (05/02/2019), 260 mg (03/21/2019), 260 mg (04/11/2019), 260 mg (05/23/2019), 260 mg (06/20/2019), 260 mg (07/18/2019), 260 mg (08/09/2019), 260 mg (08/30/2019), 260 mg (09/19/2019), 260 mg (10/11/2019)  for chemotherapy treatment.    11/01/2019 -  Chemotherapy   The patient had trastuzumab-dkst (OGIVRI) 399 mg in sodium chloride 0.9 % 250 mL chemo infusion, 6 mg/kg = 399 mg (100 % of original dose 6 mg/kg), Intravenous,  Once, 9 of 12 cycles Dose modification: 6 mg/kg (original dose 6 mg/kg, Cycle 1, Reason: Provider Judgment) Administration: 399 mg (11/01/2019), 399 mg (12/12/2019), 399 mg (03/05/2020), 399 mg (01/09/2020), 399 mg (02/13/2020), 399 mg (03/28/2020), 399 mg (04/16/2020), 399 mg (05/08/2020), 399 mg (05/29/2020)  for chemotherapy treatment.    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  CURRENT THERAPY:   INTERVAL HISTORY:   REVIEW OF SYSTEMS:   Constitutional: Denies fevers, chills or abnormal weight loss Eyes:  Denies blurriness of vision Ears, nose, mouth, throat, and face: Denies mucositis or sore throat Respiratory: Denies cough, dyspnea or wheezes Cardiovascular: Denies palpitation, chest discomfort or lower extremity swelling Gastrointestinal:  Denies nausea, heartburn or change in bowel habits Skin: Denies abnormal skin rashes Lymphatics: Denies new lymphadenopathy or easy bruising Neurological:Denies numbness, tingling or new weaknesses Behavioral/Psych: Mood is stable, no new changes  All other systems were reviewed with the patient and are negative.  MEDICAL HISTORY:  Past Medical History:  Diagnosis Date  . Alcohol abuse    none since 2013  . Anemia    during chemo  . Anxiety    At age 70  . Arthritis Dx 2010  . Bipolar disorder ([place])   . Bronchitis   . Cancer ([place])    breast mets to brain  . Chronic pain    resolved per patient 12/25/19  . Complication of anesthesia   . Depression   . Family history of adverse reaction to anesthesia    MOther had PONV  . GERD (gastroesophageal reflux disease)   . Headache    hx  migraines  . Lymphedema of left arm   . Opiate dependence ([place])   . PONV (postoperative nausea and vomiting)   . Port-A-Cath in place   . PTSD (post-traumatic stress disorder)   . S/P endometrial ablation    in md's office    SURGICAL HISTORY: Past Surgical History:  Procedure Laterality Date  . APPLICATION OF CRANIAL NAVIGATION N/A 08/14/2016   Procedure: APPLICATION OF CRANIAL NAVIGATION;  Surgeon: Erline Levine, MD;  Location: [place] [facility];  Service: Neurosurgery;  Laterality: N/A;  . BREAST RECONSTRUCTION Left    with silicone implant  . COLONOSCOPY W/ POLYPECTOMY    . CRANIOTOMY N/A 08/14/2016   Procedure: CRANIOTOMY TUMOR EXCISION WITH Lucky Rathke;  Surgeon: Erline Levine, MD;  Location: [place] [facility];  Service: Neurosurgery;  Laterality: N/A;  . FIBULA FRACTURE SURGERY Left   . MASTECTOMY Left   . RADIOLOGY WITH ANESTHESIA N/A 07/23/2016   Procedure:  MRI OF BRAIN WITH AND WITHOUT;  Surgeon: Medication Radiologist, MD;  Location: [place];  Service: Radiology;  Laterality: N/A;  . RADIOLOGY WITH ANESTHESIA N/A 09/08/2016   Procedure: MRI OF BRAIN WITH AND WITHOUT CONTRAST;  Surgeon: Medication Radiologist, MD;  Location: [place];  Service: Radiology;  Laterality: N/A;  . RADIOLOGY WITH ANESTHESIA N/A 12/10/2016   Procedure: MRI OF BRAIN WITH AND WITHOUT;  Surgeon: Medication Radiologist, MD;  Location: [place];  Service: Radiology;  Laterality: N/A;  . RADIOLOGY WITH ANESTHESIA N/A 03/02/2017   Procedure: MRI of BRAIN W and W/OUT CONTRAST;  Surgeon: Medication Radiologist, MD;  Location: [place];  Service: Radiology;  Laterality: N/A;  . RADIOLOGY WITH ANESTHESIA N/A 07/29/2017   Procedure: RADIOLOGY WITH ANESTHESIA MRI OF BRAIN WITH AND WITHOUT CONTRAST;  Surgeon: Radiologist, Medication, MD;  Location: [place];  Service: Radiology;  Laterality: N/A;  . RADIOLOGY WITH ANESTHESIA N/A 12/07/2017   Procedure: MRI WITH ANESTHESIA OF BRAIN WITH AND WITHOUT CONTRAST;  Surgeon: Radiologist, Medication, MD;  Location: [place];  Service: Radiology;  Laterality: N/A;  . RADIOLOGY WITH ANESTHESIA N/A 04/07/2018   Procedure: MRI OF BRAIN WITH AND WITHOUT CONTRAST;  Surgeon: Radiologist, Medication, MD;  Location: [place];  Service: Radiology;  Laterality: N/A;  . RADIOLOGY WITH ANESTHESIA N/A 08/23/2018   Procedure:  MRI WITH ANESTHESIA OF THE BRAIN WITH AND WITHOUT;  Surgeon: Radiologist, Medication, MD;  Location: [place];  Service: Radiology;  Laterality: N/A;  . RADIOLOGY WITH ANESTHESIA N/A 01/24/2019   Procedure: MRI OF BRAIN WITH AND WITHOUT CONTRAST;  Surgeon: Radiologist, Medication, MD;  Location: [place];  Service: Radiology;  Laterality: N/A;  . RADIOLOGY WITH ANESTHESIA N/A 07/06/2019   Procedure: MRI WITH ANESTHESIA OF BRAIN WITH AND WITHOUT CONTRAST;  Surgeon: Radiologist, Medication, MD;  Location: [place];  Service: Radiology;  Laterality: N/A;  . RADIOLOGY WITH  ANESTHESIA N/A 01/11/2020   Procedure: MRI WITH ANESTHESIA BRAIN WITH AND WITHOUT;  Surgeon: Radiologist, Medication, MD;  Location: [place];  Service: Radiology;  Laterality: N/A;  . RADIOLOGY WITH ANESTHESIA N/A 05/21/2020   Procedure: MRI WITH ANESTHESIA OF BRAIN WITH AND WITHOUT CONTRAST;  Surgeon: Radiologist, Medication, MD;  Location: [place];  Service: Radiology;  Laterality: N/A;  . right power port placement Right     I have reviewed the social history and family history with the patient and they are unchanged from previous note.  ALLERGIES:  is allergic to demerol [meperidine hcl] and erythromycin.  MEDICATIONS:  Current Outpatient Medications  Medication Sig Dispense Refill  . acetaminophen (TYLENOL) 500 MG tablet Take 1,000 mg by mouth every 6 (six) hours as needed for moderate pain.    [name] anastrozole (ARIMIDEX) 1 MG tablet TAKE 1 TABLET BY MOUTH EVERY DAY 90 tablet 1  . azelastine (ASTELIN) 0.1 % nasal spray Place 2 sprays into both nostrils 2 (two) times daily. Use in each nostril as directed 30 mL 12  . Cholecalciferol (VITAMIN D3) 250 MCG (10000 UT) capsule Take 10,000 Units by mouth daily.     . cyclobenzaprine (FLEXERIL) 10 MG tablet TAKE 1 TABLET BY MOUTH TWICE A DAY 60 tablet 1  . cycloSPORINE (RESTASIS) 0.05 % ophthalmic emulsion Place 1 drop into both eyes 2 (two) times daily as needed (dry eyes).     . fluticasone (FLONASE) 50 MCG/ACT nasal spray SPRAY 2 SPRAYS INTO EACH NOSTRIL EVERY DAY 16 mL 2  . gabapentin (NEURONTIN) 300 MG capsule Take 1-2 capsules (300-600 mg total) by mouth See admin instructions. Take 300 mg by mouth in the morning and take 600 mg by mouth at bedtime 90 capsule 0  . ibuprofen (ADVIL) 800 MG tablet Take 0.5-1 tablets (400-800 mg total) by mouth 2 (two) times daily as needed. Take with food. 90 tablet 0  . lamoTRIgine (LAMICTAL) 100 MG tablet Take 100 mg by mouth every morning.    . loratadine (CLARITIN) 10 MG tablet TAKE 1 TABLET BY MOUTH EVERY DAY  (Patient taking differently: Take 10 mg by mouth daily. ) 30 tablet 6  . Lurasidone HCl (LATUDA) 60 MG TABS Take 60 mg by mouth daily.    . ondansetron (ZOFRAN) 8 MG tablet Take 1 tablet (8 mg total) by mouth every 8 (eight) hours as needed for nausea or vomiting. 90 tablet 1  . pantoprazole (PROTONIX) 40 MG tablet Take 1 tablet (40 mg total) by mouth daily. 90 tablet 1  . traZODone (DESYREL) 100 MG tablet Take 1 tablet (100 mg total) by mouth at bedtime. (Patient taking differently: Take 300 mg by mouth at bedtime. ) 30 tablet 0   No current facility-administered medications for this visit.    PHYSICAL EXAMINATION: ECOG PERFORMANCE STATUS: {CHL ONC ECOG PS:[id number]}  There were no vitals filed for this visit. There were no vitals filed for this visit.  GENERAL:alert, no  distress and comfortable SKIN: skin color, texture, turgor are normal, no rashes or significant lesions EYES: normal, Conjunctiva are pink and non-injected, sclera clear OROPHARYNX:no exudate, no erythema and lips, buccal mucosa, and tongue normal  NECK: supple, thyroid normal size, non-tender, without nodularity LYMPH:  no palpable lymphadenopathy in the cervical, axillary or inguinal LUNGS: clear to auscultation and percussion with normal breathing effort HEART: regular rate & rhythm and no murmurs and no lower extremity edema ABDOMEN:abdomen soft, non-tender and normal bowel sounds Musculoskeletal:no cyanosis of digits and no clubbing  NEURO: alert & oriented x 3 with fluent speech, no focal motor/sensory deficits  LABORATORY DATA:  I have reviewed the data as listed CBC Latest Ref Rng & Units 05/29/2020 05/08/2020 04/16/2020  WBC 4.0 - 10.5 K/uL 2.5(L) 2.6(L) 3.0(L)  Hemoglobin 12.0 - 15.0 g/dL 11.6(L) 11.8(L) 10.9(L)  Hematocrit 36 - 46 % 34.5(L) 35.4(L) 33.3(L)  Platelets 150 - 400 K/uL 64(L) 66(L) 60(L)     CMP Latest Ref Rng & Units 05/29/2020 05/08/2020 04/16/2020  Glucose 70 - 99 mg/dL 94 97 95  BUN 6 - 20  mg/dL 10 8 10   Creatinine 0.44 - 1.00 mg/dL 1.04(H) 0.98 0.94  Sodium 135 - 145 mmol/L 134(L) 140 140  Potassium 3.5 - 5.1 mmol/L 4.0 4.1 4.4  Chloride 98 - 111 mmol/L 98 102 103  CO2 22 - 32 mmol/L 28 24 26   Calcium 8.9 - 10.3 mg/dL 9.4 9.6 8.9  Total Protein 6.5 - 8.1 g/dL 6.8 6.8 6.6  Total Bilirubin 0.3 - 1.2 mg/dL 0.3 0.2(L) 0.4  Alkaline Phos 38 - 126 U/L 85 75 76  AST 15 - 41 U/L 30 34 36  ALT 0 - 44 U/L 22 28 28       RADIOGRAPHIC STUDIES: I have personally reviewed the radiological images as listed and agreed with the findings in the report. No results found.   ASSESSMENT & PLAN:  No problem-specific Assessment & Plan notes found for this encounter.   No orders of the defined types were placed in this encounter.  All questions were answered. The patient knows to call the clinic with any problems, questions or concerns. No barriers to learning was detected. I spent {CHL ONC TIME VISIT - OMAYO:[id number]} counseling the patient face to face. The total time spent in the appointment was {CHL ONC TIME VISIT - LTRVU:[id number]} and more than 50% was on counseling and review of test results     Alla Feeling, NP 06/16/20

## 2020-06-18 ENCOUNTER — Other Ambulatory Visit: Payer: Self-pay

## 2020-06-18 ENCOUNTER — Encounter: Payer: Self-pay | Admitting: Adult Health

## 2020-06-18 ENCOUNTER — Inpatient Hospital Stay: Payer: Medicaid Other | Attending: Medical

## 2020-06-18 ENCOUNTER — Inpatient Hospital Stay: Payer: Medicaid Other

## 2020-06-18 ENCOUNTER — Inpatient Hospital Stay (HOSPITAL_BASED_OUTPATIENT_CLINIC_OR_DEPARTMENT_OTHER): Payer: Medicaid Other | Admitting: Adult Health

## 2020-06-18 ENCOUNTER — Other Ambulatory Visit: Payer: Self-pay | Admitting: Adult Health

## 2020-06-18 VITALS — BP 110/75 | HR 84 | Temp 98.2°F | Resp 18 | Ht 65.0 in | Wt 141.2 lb

## 2020-06-18 DIAGNOSIS — C7931 Secondary malignant neoplasm of brain: Secondary | ICD-10-CM

## 2020-06-18 DIAGNOSIS — C50312 Malignant neoplasm of lower-inner quadrant of left female breast: Secondary | ICD-10-CM | POA: Insufficient documentation

## 2020-06-18 DIAGNOSIS — J302 Other seasonal allergic rhinitis: Secondary | ICD-10-CM | POA: Diagnosis not present

## 2020-06-18 DIAGNOSIS — K219 Gastro-esophageal reflux disease without esophagitis: Secondary | ICD-10-CM

## 2020-06-18 DIAGNOSIS — C7940 Secondary malignant neoplasm of unspecified part of nervous system: Secondary | ICD-10-CM | POA: Insufficient documentation

## 2020-06-18 DIAGNOSIS — C50012 Malignant neoplasm of nipple and areola, left female breast: Secondary | ICD-10-CM

## 2020-06-18 DIAGNOSIS — C7951 Secondary malignant neoplasm of bone: Secondary | ICD-10-CM

## 2020-06-18 DIAGNOSIS — D696 Thrombocytopenia, unspecified: Secondary | ICD-10-CM

## 2020-06-18 DIAGNOSIS — Z5112 Encounter for antineoplastic immunotherapy: Secondary | ICD-10-CM | POA: Insufficient documentation

## 2020-06-18 DIAGNOSIS — C770 Secondary and unspecified malignant neoplasm of lymph nodes of head, face and neck: Secondary | ICD-10-CM

## 2020-06-18 DIAGNOSIS — C50119 Malignant neoplasm of central portion of unspecified female breast: Secondary | ICD-10-CM

## 2020-06-18 DIAGNOSIS — Z95828 Presence of other vascular implants and grafts: Secondary | ICD-10-CM

## 2020-06-18 DIAGNOSIS — C50011 Malignant neoplasm of nipple and areola, right female breast: Secondary | ICD-10-CM

## 2020-06-18 DIAGNOSIS — F411 Generalized anxiety disorder: Secondary | ICD-10-CM

## 2020-06-18 LAB — CBC WITH DIFFERENTIAL/PLATELET
Abs Immature Granulocytes: 0 10*3/uL (ref 0.00–0.07)
Basophils Absolute: 0 10*3/uL (ref 0.0–0.1)
Basophils Relative: 0 %
Eosinophils Absolute: 0 10*3/uL (ref 0.0–0.5)
Eosinophils Relative: 0 %
HCT: 35 % — ABNORMAL LOW (ref 36.0–46.0)
Hemoglobin: 11.9 g/dL — ABNORMAL LOW (ref 12.0–15.0)
Immature Granulocytes: 0 %
Lymphocytes Relative: 25 %
Lymphs Abs: 0.6 10*3/uL — ABNORMAL LOW (ref 0.7–4.0)
MCH: 30.8 pg (ref 26.0–34.0)
MCHC: 34 g/dL (ref 30.0–36.0)
MCV: 90.7 fL (ref 80.0–100.0)
Monocytes Absolute: 0.2 10*3/uL (ref 0.1–1.0)
Monocytes Relative: 10 %
Neutro Abs: 1.5 10*3/uL — ABNORMAL LOW (ref 1.7–7.7)
Neutrophils Relative %: 65 %
Platelets: 60 10*3/uL — ABNORMAL LOW (ref 150–400)
RBC: 3.86 MIL/uL — ABNORMAL LOW (ref 3.87–5.11)
RDW: 12.8 % (ref 11.5–15.5)
WBC: 2.4 10*3/uL — ABNORMAL LOW (ref 4.0–10.5)
nRBC: 0 % (ref 0.0–0.2)

## 2020-06-18 LAB — COMPREHENSIVE METABOLIC PANEL
ALT: 27 U/L (ref 0–44)
AST: 35 U/L (ref 15–41)
Albumin: 3.9 g/dL (ref 3.5–5.0)
Alkaline Phosphatase: 75 U/L (ref 38–126)
Anion gap: 12 (ref 5–15)
BUN: 10 mg/dL (ref 6–20)
CO2: 27 mmol/L (ref 22–32)
Calcium: 10.3 mg/dL (ref 8.9–10.3)
Chloride: 97 mmol/L — ABNORMAL LOW (ref 98–111)
Creatinine, Ser: 1.12 mg/dL — ABNORMAL HIGH (ref 0.44–1.00)
GFR calc Af Amer: >60 mL/min (ref >60)
GFR calc non Af Amer: 54 mL/min — ABNORMAL LOW (ref >60)
Glucose, Bld: 95 mg/dL (ref 70–99)
Potassium: 4.1 mmol/L (ref 3.5–5.1)
Sodium: 136 mmol/L (ref 135–145)
Total Bilirubin: 0.4 mg/dL (ref 0.3–1.2)
Total Protein: 6.8 g/dL (ref 6.5–8.1)

## 2020-06-18 LAB — VITAMIN B12: Vitamin B-12: 412 pg/mL (ref 180–914)

## 2020-06-18 MED ORDER — SODIUM CHLORIDE 0.9 % IV SOLN
Freq: Once | INTRAVENOUS | Status: DC
  Filled 2020-06-18: qty 250

## 2020-06-18 MED ORDER — PANTOPRAZOLE SODIUM 40 MG PO TBEC
40.0000 mg | DELAYED_RELEASE_TABLET | Freq: Every day | ORAL | 3 refills | Status: DC
Start: 2020-06-18 — End: 2021-06-09

## 2020-06-18 MED ORDER — SODIUM CHLORIDE 0.9% FLUSH
10.0000 mL | Freq: Once | INTRAVENOUS | Status: AC
  Administered 2020-06-18: 10 mL
  Filled 2020-06-18: qty 10

## 2020-06-18 MED ORDER — ACETAMINOPHEN 325 MG PO TABS
650.0000 mg | ORAL_TABLET | Freq: Once | ORAL | Status: AC
  Administered 2020-06-18: 650 mg via ORAL

## 2020-06-18 MED ORDER — SODIUM CHLORIDE 0.9 % IV SOLN
Freq: Once | INTRAVENOUS | Status: AC
  Filled 2020-06-18: qty 250

## 2020-06-18 MED ORDER — SODIUM CHLORIDE 0.9 % IV SOLN
INTRAVENOUS | Status: DC
  Filled 2020-06-18: qty 250

## 2020-06-18 MED ORDER — LORATADINE 10 MG PO TABS: 10.0000 mg | ORAL_TABLET | Freq: Every day | ORAL | 3 refills | Status: DC

## 2020-06-18 MED ORDER — SODIUM CHLORIDE 0.9% FLUSH
10.0000 mL | INTRAVENOUS | Status: DC | PRN
  Administered 2020-06-18: 10 mL
  Filled 2020-06-18: qty 10

## 2020-06-18 MED ORDER — TRASTUZUMAB-DKST CHEMO 150 MG IV SOLR
6.0000 mg/kg | Freq: Once | INTRAVENOUS | Status: AC
  Administered 2020-06-18: 399 mg via INTRAVENOUS
  Filled 2020-06-18: qty 19

## 2020-06-18 MED ORDER — HEPARIN SOD (PORK) LOCK FLUSH 100 UNIT/ML IV SOLN
500.0000 [IU] | Freq: Once | INTRAVENOUS | Status: AC | PRN
  Administered 2020-06-18: 500 [IU]
  Filled 2020-06-18: qty 5

## 2020-06-18 MED ORDER — ACETAMINOPHEN 325 MG PO TABS
ORAL_TABLET | ORAL | Status: AC
  Filled 2020-06-18: qty 2

## 2020-06-18 NOTE — Progress Notes (Signed)
[facility]  [phone] [phone]     ID: [name] DOB: 1961/05/13  MR#: [id number]  DSK#:[id number]  Patient Care Team: Charlott Rakes, MD as PCP - General (Family Medicine) Kyung Rudd, MD as Consulting Physician (Radiation Oncology) Erline Levine, MD as Consulting Physician (Neurosurgery) Corena Pilgrim, MD as Consulting Physician (Psychiatry) Mickeal Skinner Acey Lav, MD as Consulting Physician (Psychiatry) Nehemiah Settle, MD as Referring Physician (Plastic Surgery) Irene Limbo, MD as Consulting Physician (Plastic Surgery) Dillingham, Loel Lofty, DO as Attending Physician (Plastic Surgery) Magrinat, Virgie Dad, MD as Consulting Physician (Oncology)   CHIEF COMPLAINT: Metastatic triple positive breast cancer  CURRENT TREATMENT: trastuzumab [every 21 days], denosumab/Xgeva (Q6W), anastrozole   INTERVAL HISTORY: [name] returns today for follow up and treatment of her metastatic triple positive breast cancer.  She continues on trastuzumab, given every 21 days.  We switched back to this for maintenance after she took the TDM 1 for 8 months. Her most recent echocardiogram on 4/12/2021and showed an LVEF of 65-70%.  She completes her echo every 6 months and this is next due in 08/2020.    She is tolerating denosumab/Xgeva every 6 weeks and has no issues with taking this.      She continues on anastrozole with good tolerance only needing some coconut oil for intercourse for vaginal dryness.    Her most recent restaging brain MRI that was negative for metastatic disease on 05/22/2020, and she will undergo repeat in 10/2020.  She underwent CT chest and bone scan which showed no sign of cancer progression.     REVIEW OF SYSTEMS: [name] continues to feel well.  She says that she hasn't been drinking as much water as she normally does, and she feels a little wobbly today.  Her weight is stable.  Her appetite is good and she continues to mow yards when she  can and the weather is agreeable.    She has no new symptoms today and a detailed ROS was otherwise non contributory.    BREAST CANCER HISTORY: From the original intake note:  [name] was aware of a "lemon sized lump in" her left axilla for about a year before bringing it to medical attention. By then she had developed left breast and left axillary swelling (June 2016). She presented to the local emergency room and had a chest CT scan 06/06/2015 which showed a nodule in the left breast measuring 0.9 cm and questionable left axillary adenopathy. She then proceeded to bilateral diagnostic mammography and left breast ultrasonography 06/19/2015. There were no prior films for comparison (last mammography 12 years prior).. The breast density was category C. Mammography showed in the left breast upper inner quadrant a 7 cm area including a small mass and significant pleomorphic calcifications. Ultrasonography defined the mass as measuring 1.2 cm. The left axilla appeared unremarkable. There was significant skin edema.  Biopsy of the left breast mass 06/19/2015 showed (SP [id number]) an invasive ductal carcinoma, grade 2, estrogen receptor 83% positive, progesterone receptor 26% positive, and HER-2 amplified by immunohistochemstry with a 3+ reading. The patient had biopsies of a separate area in the left breast August of the same year and this showed atypical ductal hyperplasia. (SP [id number]).  Accordingly after appropriate discussion on 08/21/2015 the patient proceeded to left mastectomy with left axillary sentinel lymph node sampling, which, since the lymph nodes were positive, extended to the procedure to left axillary lymph node dissection. The pathology (SP [id number]) showed an invasive ductal carcinoma, grade 3, measuring in excess  of 9 cm. There were also skin satellites, not contiguous with the invasive carcinoma. Margins were clear and ample. There was evidence of lymphovascular invasion. A total of 15 lymph  nodes were removed, including 5 sentinel lymph nodes, all of which were positive, so that the final total was 14 out of 15 lymph nodes involved by tumor. There was evidence of extranodal extension. The final pathology was pT4b pN2a, stage IIIB  CA-27-29 and CEA 09/19/2015 were non-informative October 2016.  Unfortunately CT scans of the chest abdomen and pelvis 09/16/2015 showed bony metastases to the ri/ght scapula, left iliac crest, and also L4 and T-spine. There were questionable liver cysts which on repeat CT scan 03/02/2016 appear to be a little bit more well-defined, possibly a little larger. There were also some possible right upper lobe lung lesions.  Adjuvant treatment consisted of docetaxel, trastuzumab and pertuzumab, with the final (6th) docetaxel dose given 02/11/2016. She continues on trastuzumab and pertuzumab, with the 11th cycle given 05/05/2016. Echocardiogram 02/26/2016 showed an ejection fraction of 55%. She receives denosumab/Xgeva every 4 weeks.. She also receives radiation, started 06/09/201, to be completed 06/26/2016.  Her subsequent history is as detailed below   PAST MEDICAL HISTORY: Past Medical History:  Diagnosis Date  . Alcohol abuse    none since 2013  . Anemia    during chemo  . Anxiety    At age 59  . Arthritis Dx 2010  . Bipolar disorder ([place])   . Bronchitis   . Cancer ([place])    breast mets to brain  . Chronic pain    resolved per patient 59/25/21  . Complication of anesthesia   . Depression   . Family history of adverse reaction to anesthesia    MOther had PONV  . GERD (gastroesophageal reflux disease)   . Headache    hx  migraines  . Lymphedema of left arm   . Opiate dependence ([place])   . PONV (postoperative nausea and vomiting)   . Port-A-Cath in place   . PTSD (post-traumatic stress disorder)   . S/P endometrial ablation    in md's office    PAST SURGICAL HISTORY: Past Surgical History:  Procedure Laterality Date  . APPLICATION OF  CRANIAL NAVIGATION N/A 08/14/2016   Procedure: APPLICATION OF CRANIAL NAVIGATION;  Surgeon: Erline Levine, MD;  Location: [facility];  Service: Neurosurgery;  Laterality: N/A;  . BREAST RECONSTRUCTION Left    with silicone implant  . COLONOSCOPY W/ POLYPECTOMY    . CRANIOTOMY N/A 08/14/2016   Procedure: CRANIOTOMY TUMOR EXCISION WITH Lucky Rathke;  Surgeon: Erline Levine, MD;  Location: [facility];  Service: Neurosurgery;  Laterality: N/A;  . FIBULA FRACTURE SURGERY Left   . MASTECTOMY Left   . RADIOLOGY WITH ANESTHESIA N/A 07/23/2016   Procedure: MRI OF BRAIN WITH AND WITHOUT;  Surgeon: Medication Radiologist, MD;  Location: [facility];  Service: Radiology;  Laterality: N/A;  . RADIOLOGY WITH ANESTHESIA N/A 09/08/2016   Procedure: MRI OF BRAIN WITH AND WITHOUT CONTRAST;  Surgeon: Medication Radiologist, MD;  Location: [facility];  Service: Radiology;  Laterality: N/A;  . RADIOLOGY WITH ANESTHESIA N/A 12/10/2016   Procedure: MRI OF BRAIN WITH AND WITHOUT;  Surgeon: Medication Radiologist, MD;  Location: [facility];  Service: Radiology;  Laterality: N/A;  . RADIOLOGY WITH ANESTHESIA N/A 03/02/2017   Procedure: MRI of BRAIN W and W/OUT CONTRAST;  Surgeon: Medication Radiologist, MD;  Location: [facility];  Service: Radiology;  Laterality: N/A;  . RADIOLOGY WITH ANESTHESIA N/A 07/29/2017  Procedure: RADIOLOGY WITH ANESTHESIA MRI OF BRAIN WITH AND WITHOUT CONTRAST;  Surgeon: Radiologist, Medication, MD;  Location: [facility];  Service: Radiology;  Laterality: N/A;  . RADIOLOGY WITH ANESTHESIA N/A 12/07/2017   Procedure: MRI WITH ANESTHESIA OF BRAIN WITH AND WITHOUT CONTRAST;  Surgeon: Radiologist, Medication, MD;  Location: [facility];  Service: Radiology;  Laterality: N/A;  . RADIOLOGY WITH ANESTHESIA N/A 04/07/2018   Procedure: MRI OF BRAIN WITH AND WITHOUT CONTRAST;  Surgeon: Radiologist, Medication, MD;  Location: [facility];  Service: Radiology;  Laterality: N/A;  . RADIOLOGY WITH ANESTHESIA N/A 08/23/2018   Procedure: MRI WITH ANESTHESIA  OF THE BRAIN WITH AND WITHOUT;  Surgeon: Radiologist, Medication, MD;  Location: [facility];  Service: Radiology;  Laterality: N/A;  . RADIOLOGY WITH ANESTHESIA N/A 01/24/2019   Procedure: MRI OF BRAIN WITH AND WITHOUT CONTRAST;  Surgeon: Radiologist, Medication, MD;  Location: [facility];  Service: Radiology;  Laterality: N/A;  . RADIOLOGY WITH ANESTHESIA N/A 07/06/2019   Procedure: MRI WITH ANESTHESIA OF BRAIN WITH AND WITHOUT CONTRAST;  Surgeon: Radiologist, Medication, MD;  Location: [facility];  Service: Radiology;  Laterality: N/A;  . RADIOLOGY WITH ANESTHESIA N/A 01/11/2020   Procedure: MRI WITH ANESTHESIA BRAIN WITH AND WITHOUT;  Surgeon: Radiologist, Medication, MD;  Location: [facility];  Service: Radiology;  Laterality: N/A;  . RADIOLOGY WITH ANESTHESIA N/A 05/21/2020   Procedure: MRI WITH ANESTHESIA OF BRAIN WITH AND WITHOUT CONTRAST;  Surgeon: Radiologist, Medication, MD;  Location: [facility];  Service: Radiology;  Laterality: N/A;  . right power port placement Right     FAMILY HISTORY Family History  Problem Relation Age of Onset  . Diabetes Mother   . Bipolar disorder Mother   . CAD Father    The patient's father still living, age 88, in [place]. He had prostate cancer at some point in the past. The patient's mother died at age 36 from complications of diabetes. The patient had no brothers, 2 sisters. A paternal grandmother had lung cancer in the setting of tobacco abuse. There is no other history of cancer in the family to her knowledge   GYNECOLOGIC HISTORY:  No LMP recorded. Patient has had an ablation. Menarche approximately age 23. First live birth in [birth year]. The patient is GX P2. She underwent endometrial ablation in 2016.   SOCIAL HISTORY: Updated August 2019 The patient is originally from [place]. She has lived in [place] before but more recently was in [place]. She is back here because she could not afford her rent in [place]. She is divorced. Her 2 children are [name] who  lives in [place] and works as a Development worker, community, and [name] who also lives in [place] and works as a Catering manager. The patient has a grandchild, Arelia Longest, 82 years old as of July 2019, living in [place] with his mother. The patient also has a grandson born in February 2019, who lives in [place]. The patient has not established herself with a local church yet.    ADVANCED DIRECTIVES: Not in place; at the 06/03/2016 visit the patient was given the appropriate forms to complete and notarize at her discretion   HEALTH MAINTENANCE: Social History   Tobacco Use  . Smoking status: Current Every Day Smoker    Packs/day: 1.00    Years: 43.00    Pack years: 43.00    Types: Cigarettes  . Smokeless tobacco: Never Used  Vaping Use  . Vaping Use: Some days  Substance Use Topics  . Alcohol use: No    Comment: no  ETOH since 08/22/12  . Drug use: No    Comment: states she's in recovery program for 7 years     Colonoscopy:  PAP:  Bone density:   Allergies  Allergen Reactions  . Demerol [Meperidine Hcl] Itching and Nausea And Vomiting    INTOLERANCE >  N & V  . Erythromycin Rash    Current Outpatient Medications on File Prior to Visit  Medication Sig Dispense Refill  . acetaminophen (TYLENOL) 500 MG tablet Take 1,000 mg by mouth every 6 (six) hours as needed for moderate pain.    [name] anastrozole (ARIMIDEX) 1 MG tablet TAKE 1 TABLET BY MOUTH EVERY DAY 90 tablet 1  . azelastine (ASTELIN) 0.1 % nasal spray Place 2 sprays into both nostrils 2 (two) times daily. Use in each nostril as directed 30 mL 12  . Cholecalciferol (VITAMIN D3) 250 MCG (10000 UT) capsule Take 10,000 Units by mouth daily.     . cyclobenzaprine (FLEXERIL) 10 MG tablet TAKE 1 TABLET BY MOUTH TWICE A DAY 60 tablet 1  . cycloSPORINE (RESTASIS) 0.05 % ophthalmic emulsion Place 1 drop into both eyes 2 (two) times daily as needed (dry eyes).     . fluticasone (FLONASE) 50 MCG/ACT nasal spray SPRAY 2 SPRAYS INTO EACH NOSTRIL EVERY DAY 16  mL 2  . gabapentin (NEURONTIN) 300 MG capsule Take 1-2 capsules (300-600 mg total) by mouth See admin instructions. Take 300 mg by mouth in the morning and take 600 mg by mouth at bedtime 90 capsule 0  . ibuprofen (ADVIL) 800 MG tablet Take 0.5-1 tablets (400-800 mg total) by mouth 2 (two) times daily as needed. Take with food. 90 tablet 0  . lamoTRIgine (LAMICTAL) 100 MG tablet Take 100 mg by mouth every morning.    . loratadine (CLARITIN) 10 MG tablet TAKE 1 TABLET BY MOUTH EVERY DAY (Patient taking differently: Take 10 mg by mouth daily. ) 30 tablet 6  . Lurasidone HCl (LATUDA) 60 MG TABS Take 60 mg by mouth daily.    . ondansetron (ZOFRAN) 8 MG tablet Take 1 tablet (8 mg total) by mouth every 8 (eight) hours as needed for nausea or vomiting. 90 tablet 1  . pantoprazole (PROTONIX) 40 MG tablet Take 1 tablet (40 mg total) by mouth daily. 90 tablet 1  . traZODone (DESYREL) 100 MG tablet Take 1 tablet (100 mg total) by mouth at bedtime. (Patient taking differently: Take 300 mg by mouth at bedtime. ) 30 tablet 0   No current facility-administered medications on file prior to visit.    OBJECTIVE:  Vitals:   06/18/20 0830  BP: 110/75  Pulse: 84  Resp: 18  Temp: 98.2 F (36.8 C)  TempSrc: Temporal  SpO2: 99%  Weight: 141 lb 3.2 oz (64 kg)  Height: 5' 5"  (1.651 m)  Body mass index is 23.5 kg/m.  ECOG FS: 1 - Symptomatic but completely ambulatory  GENERAL: Patient is a well appearing female in no acute distress HEENT:  Sclerae anicteric.  Mask in place. Neck is supple.  NODES:  No cervical, supraclavicular, or axillary lymphadenopathy palpated.  BREAST EXAM:  Left breast s/p mastectomy and reconstruction, no sign of local recurrence, right breast is benign. LUNGS:  Clear to auscultation bilaterally.  No wheezes or rhonchi. HEART:  Regular rate and rhythm. No murmur appreciated. ABDOMEN:  Soft, nontender.  Positive, normoactive bowel sounds. No organomegaly palpated. MSK:  No focal spinal  tenderness to palpation. Full range of motion bilaterally in the upper extremities. EXTREMITIES: +  left arm lymphedema present and stable SKIN:  Clear with no obvious rashes or skin changes. No nail dyscrasia. NEURO:  Nonfocal. Well oriented.  Appropriate affect.    LAB RESULTS: No results found for: LABCA2  CBC    Component Value Date/Time   WBC 2.4 (L) 06/18/2020 0822   RBC 3.86 (L) 06/18/2020 0822   HGB 11.9 (L) 06/18/2020 0822   HGB 13.5 07/05/2019 1129   HGB 12.1 10/26/2017 1038   HCT 35.0 (L) 06/18/2020 0822   HCT 35.7 10/26/2017 1038   PLT 60 (L) 06/18/2020 0822   PLT 54 (L) 07/05/2019 1129   PLT 167 10/26/2017 1038   MCV 90.7 06/18/2020 0822   MCV 98.4 10/26/2017 1038   MCH 30.8 06/18/2020 0822   MCHC 34.0 06/18/2020 0822   RDW 12.8 06/18/2020 0822   RDW 13.3 10/26/2017 1038   LYMPHSABS 0.6 (L) 06/18/2020 0822   LYMPHSABS 0.5 (L) 10/26/2017 1038   MONOABS 0.2 06/18/2020 0822   MONOABS 0.1 10/26/2017 1038   EOSABS 0.0 06/18/2020 0822   EOSABS 0.0 10/26/2017 1038   BASOSABS 0.0 06/18/2020 0822   BASOSABS 0.0 10/26/2017 1038   CMP Latest Ref Rng & Units 05/29/2020 05/08/2020 04/16/2020  Glucose 70 - 99 mg/dL 94 97 95  BUN 6 - 20 mg/dL 10 8 10   Creatinine 0.44 - 1.00 mg/dL 1.04(H) 0.98 0.94  Sodium 135 - 145 mmol/L 134(L) 140 140  Potassium 3.5 - 5.1 mmol/L 4.0 4.1 4.4  Chloride 98 - 111 mmol/L 98 102 103  CO2 22 - 32 mmol/L 28 24 26   Calcium 8.9 - 10.3 mg/dL 9.4 9.6 8.9  Total Protein 6.5 - 8.1 g/dL 6.8 6.8 6.6  Total Bilirubin 0.3 - 1.2 mg/dL 0.3 0.2(L) 0.4  Alkaline Phos 38 - 126 U/L 85 75 76  AST 15 - 41 U/L 30 34 36  ALT 0 - 44 U/L 22 28 28      STUDIES: CT Chest W Contrast  Result Date: 06/10/2020 CLINICAL DATA:  Restaging breast cancer. Metastatic breast cancer to bone and brain. Status post XRT and chemotherapy. Ongoing Herceptin therapy EXAM: CT CHEST WITH CONTRAST TECHNIQUE: Multidetector CT imaging of the chest was performed during intravenous  contrast administration. CONTRAST:  34m OMNIPAQUE IOHEXOL 300 MG/ML  SOLN COMPARISON:  CT chest 08/17/2019 FINDINGS: Cardiovascular: Normal heart size.  No pericardial effusion. Mediastinum/Nodes: No enlarged mediastinal, hilar, or axillary lymph nodes. Thyroid gland, trachea, and esophagus demonstrate no significant findings. Lungs/Pleura: No pleural effusion, airspace consolidation or atelectasis. No pneumothorax. Scar like densities noted within both lower lobes. No suspicious pulmonary nodule or mass identified. Upper Abdomen: Similar appearance of scattered low-attenuation foci within the liver, too small to reliably characterize. No acute findings identified within the imaged portions of the upper abdomen. Musculoskeletal: Status post left mastectomy with implant reconstruction.Unchanged appearance of T6 and T9 sclerotic bone metastases. Remote anterior right rib fractures are stable, image 86/7 and image 100/7. IMPRESSION: 1. Stable exam. 2. Unchanged appearance of T6 and T9 sclerotic bone metastases. 3. Similar appearance of scattered low-attenuation foci within the liver, too small to reliably characterize. Electronically Signed   By: TKerby MoorsM.D.   On: 06/10/2020 09:49   MR BRAIN W WO CONTRAST  Result Date: 05/22/2020 CLINICAL DATA:  History of metastatic breast cancer. EXAM: MRI HEAD WITHOUT AND WITH CONTRAST TECHNIQUE: Multiplanar, multiecho pulse sequences of the brain and surrounding structures were obtained without and with intravenous contrast. CONTRAST:  660mGADAVIST GADOBUTROL 1 MMOL/ML IV SOLN COMPARISON:  MRI head 01/11/2020 FINDINGS: Brain: Ventricle size normal. Extensive hyperintensity throughout the cerebral white matter bilaterally is unchanged. Negative for acute infarct. Previously noted subdural hematoma on the right has nearly completely resolved with mild residual dural thickening and enhancement in the right parietal region. There are scattered areas of chronic  microhemorrhage in the brain bilaterally similar to the prior MRI. Suboccipital craniotomy on the left for tumor resection. Small area of encephalomalacia left cerebellum is unchanged. No recurrent tumor. Postcontrast imaging demonstrate no enhancing metastatic deposits in the brain. Vascular: Normal arterial flow voids Skull and upper cervical spine: No skeletal lesions. Left suboccipital craniotomy. Sinuses/Orbits: Paranasal sinuses clear.  Negative orbit. Other: None IMPRESSION: Negative for metastatic disease in the brain. Resolving right parietal subdural hematoma with residual dural thickening and enhancement. Extensive white matter changes bilaterally. Chronic microhemorrhage in both cerebral hemisphere. Findings most likely due to prior cancer treatment. Correlate with prior whole-brain radiation. Electronically Signed   By: Franchot Gallo M.D.   On: 05/22/2020 10:29   NM Bone Scan Whole Body  Result Date: 06/10/2020 CLINICAL DATA:  Breast cancer, cervical lymphadenopathy, left ankle fracture EXAM: NUCLEAR MEDICINE WHOLE BODY BONE SCAN TECHNIQUE: Whole body anterior and posterior images were obtained approximately 3 hours after intravenous injection of radiopharmaceutical. RADIOPHARMACEUTICALS:  20.8 mCi Technetium-80mMDP IV COMPARISON:  Findings are compared with prior examination of 01/31/2020 and 08/17/2019 FINDINGS: Mild asymmetric uptake in the region of the right patella is again noted, likely arthropathic in nature. No additional abnormal foci of osseous radiotracer uptake. Photopenic region over the left chest is in keeping with left breast implant. Normal visualization of the kidneys and bladder. IMPRESSION: No evidence of skeletal metastatic disease. Electronically Signed   By: AFidela SalisburyMD   On: 06/10/2020 22:21    ELIGIBLE FOR AVAILABLE RESEARCH PROTOCOL: no  ASSESSMENT: 59y.o. [place] woman with stage IV left-sided breast cancer involving bone and central nervous system  (1)  s/p left breast lower inner quadrant biopsy 06/19/2015 for a clinical T2-3 NX invasive ductal carcinoma, grade 2, triple positive.  (2) status post left mastectomy and axillary lymph node dissection  with immediate expander placement 07/18/2015 for an mpT4 pN2,stage IIIB invasive ductal carcinoma, grade 3, with negative margins.  (a) definitive implant exchange to be scheduled in December   METASTATIC DISEASE: October 2016  (3) CT scan of the chest abdomen and pelvis  09/16/2015 shows metastatic lesions in the right scapula, left iliac crest, L4, and T spine. There were questionable liver cysts, with repeat CT scan 03/02/2016 showing possible right upper lobe lung lesions and possibly increased liver lesions  (a) CT scan of the chest 06/17/2016 shows no active disease in the lungs or liver  (b) Bone scan July 2017 showed no evidence of bony metastatic disease   (c) head CT 07/08/2016 showed a cerebellar lesion, confirmed by MRI 07/23/2016, status post craniotomy 08/14/2016, confirming a metastatic deposit which was estrogen and progesterone receptor negative, HER-2 amplified with a signals ratio of 7.16, number per cell 13.25  (d) CA 27-29 is not informative  (4) received docetaxel every 3 weeks 6 together with trastuzumab and pertuzumab, last docetaxel dose 02/11/2016  (5) adjuvant [place]7/03[place]2016 [place] [facility]. The Left chest wall was treated to 23.4 Gy in 13 fractions at 1.8 Gy per fraction. 2. The Left chest wall was boosted to 10 Gy in 5 fractions at 2 Gy per fraction. 3. The Left Sclav/PAB was treated to 23.4 Gy in 13 fractions  at 1.8 Gy per fraction.  [Note: Including the patient's treatment in [place] (received 15 fractions per Dr. Maryan Rued near [place], [place]), the patient received 50.4 Gy to the left chest wall and supraclavicular region. ]  (6) started trastuzumab and pertuzumab October 2016, continuing every 3 weeks,  (a) echocardiogram 02/26/2016 showed a well  preserved ejection fraction  (b) echocardiogram 07/01/2016 shows an ejection fraction in the 60-65%   (c) pertuzumab discontinued 10/2016 with uncontrolled diarrhea  (d) echocardiogram 11/11/2016 showed an ejection fraction in the 60-65%  (e) echocardiogram 03/03/2017 shows an ejection fraction of 60-65%  (f) echocardiogram on 05/19/2017 shows an ejection fraction of 55-60%  (g) echocardiogram 09/24/2017 shows the ejection fraction in the 60-65%  (h) echocardiogram 02/14/2018 shows an ejection fraction in the 60-65%  (I) echocardiogram  06/30/2018 shows an ejection fraction in the 55-60%  (m) echocardiogram on 12/08/2018 shows an ejection fraction in the 60-65% range  (7) started denosumab/Xgeva October 2017 given every 4 weeks, transitioned to every 8 weeks beginning 10/11/18 (every 6 weeks while giving TDM1 every 3 weeks)  (8) started anastrozole October 2017   (a) bone scan 11/10/2016 shows no active disease  (b) chest CT scan 11/10/2016 stable, with no evidence of active disease  (c) chest CT and bone scan 07/02/2017 show no evidence of active disease  (d) CT scan of the chest with contrast 11/10/2017 shows some left axillary edema, but no evidence of thrombosis or adenopathy in that area, 0.9 cm precarinal lymph and 0.7 cm right upper lobe nodule node; bone lesions stable  (e) CT of the chest 05/04/2018 shows a 1.4 cm right lower paratracheal node which is slightly increased and a new right prevascular mediastinal node measuring 0.7 cm.  Bone lesions are stable.  (f) chest CT on 07/01/2018 shows no definite findings of metastatic disease in the thorax. Previously noted borderline enlarged low right paratracheal lymph node is stable to slightly decreased in size   (e) chest CT on 12/30/2018 notes mild increase in right paratracheal adneopathy, recommended PET scan.  Pet scan on 01/19/2019 showed a hypermetabolic and enlarged right paratracheal lymph node consistent with breast cancer  recurrence.  (f)Trastuzumab discontinued due to February 2020 scans   (g) TDM-1 started on 01/31/2019 given every 21 days.  (h) Chest CT 05/15/2019 shows decrease in mediastinal adenopathy  (i) CT chest on 08/17/2019 shows resolution of thoracic adenopathy  (j) TDM 1 discontinued after 10/11/2019 dose (8 months treatment) because of thrombocytopenia  (k) trastuzumab resumed 11/01/2019, repeated every 21 days  (l) echocardiogram 12/06/2019 showed an ejection fraction in the 60-65% range  (m) chest CT scan, brain CT and bone scan 01/31/2020 show no evidence of active disease  (9) history of bipolar disorder  (a) currently on Lamictal and Latuda as well as Desiree L and Neurontin  (10) mild anemia with a significant drop in the MCV, ferritin 10 06/03/2016,   (a) Feraheme given 06/12/2016 and 06/18/2016  (11) tobacco abuse: Chantix started 06/18/2016--she is not currently trying to quit   (12) 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  (a) brain irradiation10/19/17-11/08/17: Whole brain/ 35 Gy in 14 fractions   (b) repeat brain MRI obtained 12/10/2016 shows no active disease in the brain  (c) repeat brain MRI 03/02/2017 shows no evidence of residual or recurrent disease  (d) repeat brain MRI 07/29/2017 shows no evidence of residual or recurrent disease  (e) repeat brain MRI 12/07/2017 shows no evidence of disease recurrence.  There is  progressive white matter change secondary to prior treatment.  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  She has no clinical or radiographic signs of metastatic  breast cancer progression.  She will continue on her current therapy with Trastuzumab every 3 weeks, Anastrozole daily, and Xgeva every 6 weeks.  She is tolerating this very well.  She will be due for restaging in around 6 months time, with MRI brain and CT chest/bone scan.  Her most recent echocardiogram was in 02/2020 and showed an EF of 65-70%.  She is having these every 6 months.    She continues to have cytopenias, which remain stable.  I am checking a b12 level at her request today, she wonders if it is low and if she should receive b12 injections.  She has a stable mild anemia.    I encouraged her to drink more water, and I will give her some fluids today to see if that helps her.  She will return every 3 weeks for treatment, and will see me in early September, and Dr. [name] in October after her echocardiogram.  She will be due for restaging before the end of the year.    She knows to call for any questions that may arise between now and her next appointment.  We are happy to see her sooner if needed.    Total encounter time 30 minutes.Wilber Bihari, NP 06/18/20 8:42 AM Medical Oncology and Hematology [facility] [place], [place] [address] Tel. [phone]    Fax. [phone]   *Total Encounter Time as defined by the Centers for Medicare and Medicaid Services includes, in addition to the face-to-face time of a patient visit (documented in the note above) non-face-to-face time: obtaining and reviewing outside history, ordering and reviewing medications, tests or procedures, care coordination (communications with other health care professionals or caregivers) and documentation in the medical record.

## 2020-06-18 NOTE — Patient Instructions (Signed)
[facility] Discharge Instructions for Patients Receiving Chemotherapy  Today you received the following chemotherapy agents trastuzumab.  To help prevent nausea and vomiting after your treatment, we encourage you to take your nausea medication as directed.    If you develop nausea and vomiting that is not controlled by your nausea medication, call the clinic.   BELOW ARE SYMPTOMS THAT SHOULD BE REPORTED IMMEDIATELY:  *FEVER GREATER THAN 100.5 F  *CHILLS WITH OR WITHOUT FEVER  NAUSEA AND VOMITING THAT IS NOT CONTROLLED WITH YOUR NAUSEA MEDICATION  *UNUSUAL SHORTNESS OF BREATH  *UNUSUAL BRUISING OR BLEEDING  TENDERNESS IN MOUTH AND THROAT WITH OR WITHOUT PRESENCE OF ULCERS  *URINARY PROBLEMS  *BOWEL PROBLEMS  UNUSUAL RASH Items with * indicate a potential emergency and should be followed up as soon as possible.  Feel free to call the clinic should you have any questions or concerns. The clinic phone number is [phone].  Please show the CHEMO ALERT CARD at check-in to the Emergency Department and triage nurse.   

## 2020-06-18 NOTE — Progress Notes (Signed)
Echo orders.

## 2020-06-19 ENCOUNTER — Telehealth: Payer: Self-pay | Admitting: Adult Health

## 2020-06-19 NOTE — Telephone Encounter (Signed)
Scheduled appts per 7/20 los. Pt confirmed appt dates and times.

## 2020-07-02 ENCOUNTER — Other Ambulatory Visit: Payer: Self-pay

## 2020-07-02 ENCOUNTER — Ambulatory Visit (HOSPITAL_COMMUNITY)
Admission: RE | Admit: 2020-07-02 | Discharge: 2020-07-02 | Disposition: A | Discharge status: Home / Self Care | Payer: Medicaid Other | Source: Ambulatory Visit | Attending: Adult Health | Admitting: Adult Health

## 2020-07-02 DIAGNOSIS — C50011 Malignant neoplasm of nipple and areola, right female breast: Secondary | ICD-10-CM | POA: Insufficient documentation

## 2020-07-02 DIAGNOSIS — C50012 Malignant neoplasm of nipple and areola, left female breast: Secondary | ICD-10-CM | POA: Diagnosis not present

## 2020-07-02 LAB — ECHOCARDIOGRAM COMPLETE
Area-P 1/2: 2.52 cm2
S' Lateral: 2.8 cm

## 2020-07-02 NOTE — Progress Notes (Signed)
  Echocardiogram 2D Echocardiogram has been performed.  [name] 07/02/2020, 9:59 AM

## 2020-07-05 ENCOUNTER — Telehealth (HOSPITAL_COMMUNITY): Payer: Self-pay | Admitting: Vascular Surgery

## 2020-07-05 NOTE — Telephone Encounter (Signed)
This RN spoke with pt per her stating she was called by the cardiologist and is scheduled for a visit on Monday.  " what is wrong with my heart that they have to see me so urgently "  Note pt has known OCD regarding unknown aspects of her health care.  This RN discussed known fact that the herceptin we have been treating her with for years has can cause heart changes - which is why we do the Echocardiograms and why she is followed by a cardiologist.  She has some noted changes on recent echocardiogram and the cardiologist will review with her for best outcome.  [name] verbalized appreciation and less anxiety over upcoming cardiology visit.

## 2020-07-05 NOTE — Telephone Encounter (Signed)
Left pt VM, giving  New pt brst appt aw/ [facility] 07/08/20 @ 10:40 am asked pt to call back to confirm this appt

## 2020-07-08 ENCOUNTER — Other Ambulatory Visit: Payer: Self-pay

## 2020-07-08 ENCOUNTER — Ambulatory Visit (HOSPITAL_COMMUNITY)
Admission: RE | Admit: 2020-07-08 | Discharge: 2020-07-08 | Disposition: A | Discharge status: Home / Self Care | Payer: Medicaid Other | Source: Ambulatory Visit | Attending: Cardiology | Admitting: Cardiology

## 2020-07-08 ENCOUNTER — Encounter (HOSPITAL_COMMUNITY): Payer: Self-pay | Admitting: Cardiology

## 2020-07-08 VITALS — BP 112/82 | HR 87 | Ht 65.0 in | Wt 142.8 lb

## 2020-07-08 DIAGNOSIS — C50011 Malignant neoplasm of nipple and areola, right female breast: Secondary | ICD-10-CM | POA: Diagnosis not present

## 2020-07-08 DIAGNOSIS — Z170 Estrogen receptor positive status [ER+]: Secondary | ICD-10-CM | POA: Insufficient documentation

## 2020-07-08 DIAGNOSIS — C7951 Secondary malignant neoplasm of bone: Secondary | ICD-10-CM | POA: Diagnosis not present

## 2020-07-08 DIAGNOSIS — F319 Bipolar disorder, unspecified: Secondary | ICD-10-CM | POA: Insufficient documentation

## 2020-07-08 DIAGNOSIS — C50919 Malignant neoplasm of unspecified site of unspecified female breast: Secondary | ICD-10-CM | POA: Diagnosis not present

## 2020-07-08 DIAGNOSIS — T451X5A Adverse effect of antineoplastic and immunosuppressive drugs, initial encounter: Secondary | ICD-10-CM

## 2020-07-08 DIAGNOSIS — Z79899 Other long term (current) drug therapy: Secondary | ICD-10-CM | POA: Diagnosis not present

## 2020-07-08 DIAGNOSIS — C50912 Malignant neoplasm of unspecified site of left female breast: Secondary | ICD-10-CM | POA: Diagnosis present

## 2020-07-08 DIAGNOSIS — Z791 Long term (current) use of non-steroidal anti-inflammatories (NSAID): Secondary | ICD-10-CM | POA: Insufficient documentation

## 2020-07-08 DIAGNOSIS — Z9012 Acquired absence of left breast and nipple: Secondary | ICD-10-CM | POA: Diagnosis not present

## 2020-07-08 DIAGNOSIS — I427 Cardiomyopathy due to drug and external agent: Secondary | ICD-10-CM

## 2020-07-08 DIAGNOSIS — F17210 Nicotine dependence, cigarettes, uncomplicated: Secondary | ICD-10-CM | POA: Diagnosis not present

## 2020-07-08 LAB — LIPID PANEL
Cholesterol: 239 mg/dL — ABNORMAL HIGH (ref 0–200)
HDL: 57 mg/dL (ref >40)
LDL Cholesterol: 163 mg/dL — ABNORMAL HIGH (ref 0–99)
Total CHOL/HDL Ratio: 4.2 RATIO
Triglycerides: 93 mg/dL (ref <150)
VLDL: 19 mg/dL (ref 0–40)

## 2020-07-08 MED ORDER — CARVEDILOL 3.125 MG PO TABS
ORAL_TABLET | ORAL | 3 refills | Status: DC
Start: 2020-07-08 — End: 2020-10-14

## 2020-07-08 NOTE — Patient Instructions (Signed)
Start Carvedilol 3.125 mg (1 tab) Twice daily FOR 10 DAYS, THEN INCREASE TO:  6.25 MG (2 TABS) Twice daily   Labs done today, your results will be available in MyChart, we will contact you for abnormal readings.  Your physician recommends that you schedule a follow-up appointment in: 6 weeks with echocardiogram  If you have any questions or concerns before your next appointment please send [place] a message through [place] or call our office at [phone].    TO LEAVE A MESSAGE FOR THE NURSE SELECT OPTION 2, PLEASE LEAVE A MESSAGE INCLUDING: . YOUR NAME . DATE OF BIRTH . CALL BACK NUMBER . REASON FOR CALL**this is important as we prioritize the call backs  [place] AS LONG AS YOU CALL BEFORE 4:00 PM  At the [facility], you and your health needs are our priority. As part of our continuing mission to provide you with exceptional heart care, we have created designated Provider Care Teams. These Care Teams include your primary Cardiologist (physician) and Advanced Practice Providers (APPs- Physician Assistants and Nurse Practitioners) who all work together to provide you with the care you need, when you need it.   You may see any of the following providers on your designated Care Team at your next follow up: [name] Dr Glori Bickers . Dr Loralie Champagne . Darrick Grinder, NP . Lyda Jester, PA . Audry Riles, PharmD   Please be sure to bring in all your medications bottles to every appointment.

## 2020-07-08 NOTE — Progress Notes (Signed)
Oncology: Dr. [name]  59 y.o. with history of metastatic breast cancer was referred by Dr. [name] for cardio-oncology evaluation. ER+/PR+/HER2+ left breast cancer diagnosed 7/16.  She had left mastectomy, positive nodes.  She was later found to have bony mets.  6 cycles docetaxol, Herceptin/Perjeta. She had radiation in 2017.  She is now on long-term Herceptin.  She has had serial echoes while on Herceptin.  Most recent echo in 8/21 showed normal EF 60-65%, but GLS has become less negative, -19.1% => -13.5%.   She has no cardiac history.  She has been a smoker, quit earlier this month.  No exertional dyspnea or chest pain.  No orthopnea/PND.  No lightheadedness or palpitations. Generally feels good.   ECG (personally reviewed): NSR, normal  PMH: 1. Breast cancer: ER+/PR+/HER2+ left breast cancer diagnosed 7/16.  She had left mastectomy, positive nodes.  She was later found to have bony mets.   - 6 cycles docetaxol, Herceptin/Perjeta.  - Radiation 2017 - Long-term Herceptin.  - Echo (8/21): EF 60-65%, GLS -13.5%, RV normal. 2. Bipolar disorder 3. Smoker: Quit in 8/21.   FH: Father with CABG around age 62.   Social History   Socioeconomic History  . Marital status: Single    Spouse name: Not on file  . Number of children: Not on file  . Years of education: Not on file  . Highest education level: Not on file  Occupational History  . Not on file  Tobacco Use  . Smoking status: Light Tobacco Smoker    Packs/day: 1.00    Years: 43.00    Pack years: 43.00    Types: Cigarettes  . Smokeless tobacco: Never Used  . Tobacco comment: 3 day since last cigarette  Vaping Use  . Vaping Use: Some days  Substance and Sexual Activity  . Alcohol use: No    Comment: no ETOH since 08/22/12  . 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 Sexual activity: Never    Birth control/protection: Other-see comments    Comment: ablation  Other Topics Concern  . Not on  file  Social History Narrative  . Not on file   Social Determinants of Health   Financial Resource Strain:   . Difficulty of Paying Living Expenses:   Food Insecurity:   . Worried About Charity fundraiser in the Last Year:   . Arboriculturist in the Last Year:   Transportation Needs:   . Film/video editor (Medical):   [name] Lack of Transportation (Non-Medical):   Physical Activity:   . Days of Exercise per Week:   . Minutes of Exercise per Session:   Stress:   . Feeling of Stress :   Social Connections:   . Frequency of Communication with Friends and Family:   . Frequency of Social Gatherings with Friends and Family:   . Attends Religious Services:   . Active Member of Clubs or Organizations:   . Attends Archivist Meetings:   [name] Marital Status:   Intimate Partner Violence:   . Fear of Current or Ex-Partner:   . Emotionally Abused:   [name] Physically Abused:   . Sexually Abused:    ROS: All systems reviewed and negative except as per HPI.   Current Outpatient Medications  Medication Sig Dispense Refill  . acetaminophen (TYLENOL) 500 MG tablet Take 1,000 mg by mouth every 6 (six) hours as needed for moderate pain.    [name]  anastrozole (ARIMIDEX) 1 MG tablet TAKE 1 TABLET BY MOUTH EVERY DAY 90 tablet 1  . azelastine (ASTELIN) 0.1 % nasal spray Place 2 sprays into both nostrils 2 (two) times daily. Use in each nostril as directed 30 mL 12  . Cholecalciferol (VITAMIN D3) 250 MCG (10000 UT) capsule Take 10,000 Units by mouth daily.     . cyclobenzaprine (FLEXERIL) 10 MG tablet TAKE 1 TABLET BY MOUTH TWICE A DAY 60 tablet 1  . cycloSPORINE (RESTASIS) 0.05 % ophthalmic emulsion Place 1 drop into both eyes 2 (two) times daily as needed (dry eyes).     . fluticasone (FLONASE) 50 MCG/ACT nasal spray SPRAY 2 SPRAYS INTO EACH NOSTRIL EVERY DAY 16 mL 2  . gabapentin (NEURONTIN) 300 MG capsule Take 1-2 capsules (300-600 mg total) by mouth See admin instructions. Take 300 mg by mouth  in the morning and take 600 mg by mouth at bedtime 90 capsule 0  . ibuprofen (ADVIL) 800 MG tablet Take 0.5-1 tablets (400-800 mg total) by mouth 2 (two) times daily as needed. Take with food. 90 tablet 0  . lamoTRIgine (LAMICTAL) 100 MG tablet Take 100 mg by mouth every morning.    . loratadine (CLARITIN) 10 MG tablet Take 1 tablet (10 mg total) by mouth daily. 90 tablet 3  . Lurasidone HCl (LATUDA) 60 MG TABS Take 60 mg by mouth daily.    . ondansetron (ZOFRAN) 8 MG tablet Take 1 tablet (8 mg total) by mouth every 8 (eight) hours as needed for nausea or vomiting. 90 tablet 1  . pantoprazole (PROTONIX) 40 MG tablet Take 1 tablet (40 mg total) by mouth daily. 90 tablet 3  . polyethylene glycol (MIRALAX) 17 g packet Take 17 g by mouth daily as needed for mild constipation.    . traZODone (DESYREL) 100 MG tablet Take 1 tablet (100 mg total) by mouth at bedtime. (Patient taking differently: Take 300 mg by mouth at bedtime. ) 30 tablet 0  . carvedilol (COREG) 3.125 MG tablet Take 1 tablet (3.125 mg total) by mouth 2 (two) times daily for 10 days, THEN 2 tablets (6.25 mg total) 2 (two) times daily. 120 tablet 3   No current facility-administered medications for this encounter.   BP 112/82   Pulse 87   Ht _0  (1.651 m)   Wt 64.8 kg (142 lb 12.8 oz)   LMP  (LMP Unknown) Comment: ablation  SpO2 99%   BMI 23.76 kg/m  General: NAD Neck: No JVD, no thyromegaly or thyroid nodule.  Lungs: Clear to auscultation bilaterally with normal respiratory effort. CV: Nondisplaced PMI.  Heart regular S1/S2, no S3/S4, no murmur.  No peripheral edema.  No carotid bruit.  Normal pedal pulses.  Abdomen: Soft, nontender, no hepatosplenomegaly, no distention.  Skin: Intact without lesions or rashes.  Neurologic: Alert and oriented x 3.  Psych: Normal affect. Extremities: No clubbing or cyanosis.  HEENT: Normal.   Assessment/Plan: 1. Breast cancer: Metastatic, now on long-term Herceptin.  She has been getting  serial echoes since 2016, they have all showed normal EF.  However, most recent echo showed less negative GLS, with change from -19.1% => -13.5%.  This fall in strain is concerning as it could augur future fall in EF.  However, given prior echoes that have all been normal, I am concerned that the change may have just been related to technique (difficult study).  - I will have her start Coreg 3.125 mg bid x 10 days then 6.25 mg bid.  -  I will have her return in 6 wks for echo repeat echo to follow.  - She will continue Herceptin for now.  2. Smoking: I congratulated her on quitting.   Loralie Champagne 07/08/2020

## 2020-07-10 ENCOUNTER — Inpatient Hospital Stay: Payer: Medicaid Other | Attending: Medical

## 2020-07-10 ENCOUNTER — Other Ambulatory Visit: Payer: Medicaid Other

## 2020-07-10 ENCOUNTER — Inpatient Hospital Stay: Payer: Medicaid Other

## 2020-07-10 ENCOUNTER — Telehealth (HOSPITAL_COMMUNITY): Payer: Self-pay

## 2020-07-10 ENCOUNTER — Other Ambulatory Visit: Payer: Self-pay

## 2020-07-10 VITALS — BP 100/66 | HR 100 | Temp 98.0°F | Resp 16

## 2020-07-10 DIAGNOSIS — Z95828 Presence of other vascular implants and grafts: Secondary | ICD-10-CM

## 2020-07-10 DIAGNOSIS — Z112 Encounter for screening for other bacterial diseases: Secondary | ICD-10-CM | POA: Diagnosis not present

## 2020-07-10 DIAGNOSIS — D696 Thrombocytopenia, unspecified: Secondary | ICD-10-CM

## 2020-07-10 DIAGNOSIS — C50312 Malignant neoplasm of lower-inner quadrant of left female breast: Secondary | ICD-10-CM | POA: Diagnosis present

## 2020-07-10 DIAGNOSIS — Z5112 Encounter for antineoplastic immunotherapy: Secondary | ICD-10-CM | POA: Diagnosis present

## 2020-07-10 DIAGNOSIS — C770 Secondary and unspecified malignant neoplasm of lymph nodes of head, face and neck: Secondary | ICD-10-CM

## 2020-07-10 DIAGNOSIS — F411 Generalized anxiety disorder: Secondary | ICD-10-CM

## 2020-07-10 DIAGNOSIS — C50011 Malignant neoplasm of nipple and areola, right female breast: Secondary | ICD-10-CM

## 2020-07-10 DIAGNOSIS — C7951 Secondary malignant neoplasm of bone: Secondary | ICD-10-CM | POA: Diagnosis not present

## 2020-07-10 DIAGNOSIS — C7940 Secondary malignant neoplasm of unspecified part of nervous system: Secondary | ICD-10-CM | POA: Diagnosis not present

## 2020-07-10 DIAGNOSIS — C50119 Malignant neoplasm of central portion of unspecified female breast: Secondary | ICD-10-CM

## 2020-07-10 LAB — COMPREHENSIVE METABOLIC PANEL
ALT: 22 U/L (ref 0–44)
AST: 29 U/L (ref 15–41)
Albumin: 3.7 g/dL (ref 3.5–5.0)
Alkaline Phosphatase: 69 U/L (ref 38–126)
Anion gap: 7 (ref 5–15)
BUN: 11 mg/dL (ref 6–20)
CO2: 28 mmol/L (ref 22–32)
Calcium: 10.1 mg/dL (ref 8.9–10.3)
Chloride: 101 mmol/L (ref 98–111)
Creatinine, Ser: 1.04 mg/dL — ABNORMAL HIGH (ref 0.44–1.00)
GFR calc Af Amer: >60 mL/min (ref >60)
GFR calc non Af Amer: 59 mL/min — ABNORMAL LOW (ref >60)
Glucose, Bld: 87 mg/dL (ref 70–99)
Potassium: 4.1 mmol/L (ref 3.5–5.1)
Sodium: 136 mmol/L (ref 135–145)
Total Bilirubin: 0.4 mg/dL (ref 0.3–1.2)
Total Protein: 6.5 g/dL (ref 6.5–8.1)

## 2020-07-10 LAB — CBC WITH DIFFERENTIAL/PLATELET
Abs Immature Granulocytes: 0.01 10*3/uL (ref 0.00–0.07)
Basophils Absolute: 0 10*3/uL (ref 0.0–0.1)
Basophils Relative: 1 %
Eosinophils Absolute: 0 10*3/uL (ref 0.0–0.5)
Eosinophils Relative: 0 %
HCT: 35.3 % — ABNORMAL LOW (ref 36.0–46.0)
Hemoglobin: 11.6 g/dL — ABNORMAL LOW (ref 12.0–15.0)
Immature Granulocytes: 0 %
Lymphocytes Relative: 22 %
Lymphs Abs: 0.7 10*3/uL (ref 0.7–4.0)
MCH: 29.7 pg (ref 26.0–34.0)
MCHC: 32.9 g/dL (ref 30.0–36.0)
MCV: 90.3 fL (ref 80.0–100.0)
Monocytes Absolute: 0.3 10*3/uL (ref 0.1–1.0)
Monocytes Relative: 9 %
Neutro Abs: 2.3 10*3/uL (ref 1.7–7.7)
Neutrophils Relative %: 68 %
Platelets: 76 10*3/uL — ABNORMAL LOW (ref 150–400)
RBC: 3.91 MIL/uL (ref 3.87–5.11)
RDW: 12.9 % (ref 11.5–15.5)
WBC: 3.3 10*3/uL — ABNORMAL LOW (ref 4.0–10.5)
nRBC: 0 % (ref 0.0–0.2)

## 2020-07-10 MED ORDER — SODIUM CHLORIDE 0.9% FLUSH
10.0000 mL | Freq: Once | INTRAVENOUS | Status: AC
  Administered 2020-07-10: 10 mL
  Filled 2020-07-10: qty 10

## 2020-07-10 MED ORDER — ACETAMINOPHEN 325 MG PO TABS
650.0000 mg | ORAL_TABLET | Freq: Once | ORAL | Status: AC
  Administered 2020-07-10: 650 mg via ORAL

## 2020-07-10 MED ORDER — SODIUM CHLORIDE 0.9 % IV SOLN
Freq: Once | INTRAVENOUS | Status: AC
  Filled 2020-07-10: qty 250

## 2020-07-10 MED ORDER — HEPARIN SOD (PORK) LOCK FLUSH 100 UNIT/ML IV SOLN
500.0000 [IU] | Freq: Once | INTRAVENOUS | Status: AC | PRN
  Administered 2020-07-10: 500 [IU]
  Filled 2020-07-10: qty 5

## 2020-07-10 MED ORDER — ROSUVASTATIN CALCIUM 10 MG PO TABS
10.0000 mg | ORAL_TABLET | Freq: Every day | ORAL | 3 refills | Status: DC
Start: 2020-07-10 — End: 2021-07-07

## 2020-07-10 MED ORDER — DENOSUMAB 120 MG/1.7ML ~~LOC~~ SOLN
120.0000 mg | Freq: Once | SUBCUTANEOUS | Status: AC
  Administered 2020-07-10: 120 mg via SUBCUTANEOUS

## 2020-07-10 MED ORDER — TRASTUZUMAB-DKST CHEMO 150 MG IV SOLR
6.0000 mg/kg | Freq: Once | INTRAVENOUS | Status: AC
  Administered 2020-07-10: 399 mg via INTRAVENOUS
  Filled 2020-07-10: qty 19

## 2020-07-10 MED ORDER — DENOSUMAB 120 MG/1.7ML ~~LOC~~ SOLN
SUBCUTANEOUS | Status: AC
  Filled 2020-07-10: qty 1.7

## 2020-07-10 MED ORDER — ACETAMINOPHEN 325 MG PO TABS
ORAL_TABLET | ORAL | Status: AC
  Filled 2020-07-10: qty 2

## 2020-07-10 MED ORDER — SODIUM CHLORIDE 0.9% FLUSH
10.0000 mL | INTRAVENOUS | Status: DC | PRN
  Administered 2020-07-10: 10 mL
  Filled 2020-07-10: qty 10

## 2020-07-10 NOTE — Patient Instructions (Signed)
[facility] °Discharge Instructions for Patients Receiving Chemotherapy ° °Today you received the following chemotherapy agents Trastuzumab ° °To help prevent nausea and vomiting after your treatment, we encourage you to take your nausea medication as directed. °  °If you develop nausea and vomiting that is not controlled by your nausea medication, call the clinic.  ° °BELOW ARE SYMPTOMS THAT SHOULD BE REPORTED IMMEDIATELY: °· *FEVER GREATER THAN 100.5 F °· *CHILLS WITH OR WITHOUT FEVER °· NAUSEA AND VOMITING THAT IS NOT CONTROLLED WITH YOUR NAUSEA MEDICATION °· *UNUSUAL SHORTNESS OF BREATH °· *UNUSUAL BRUISING OR BLEEDING °· TENDERNESS IN MOUTH AND THROAT WITH OR WITHOUT PRESENCE OF ULCERS °· *URINARY PROBLEMS °· *BOWEL PROBLEMS °· UNUSUAL RASH °Items with * indicate a potential emergency and should be followed up as soon as possible. ° °Feel free to call the clinic should you have any questions or concerns. The clinic phone number is [phone]. ° °Please show the CHEMO ALERT CARD at check-in to the Emergency Department and triage nurse. ° ° °

## 2020-07-10 NOTE — Telephone Encounter (Signed)
Patient advised and verbalized understanding. New rx sent in,labs entered.   Orders Placed This Encounter  Procedures  . Hepatic function panel    Standing Status:   Future    Standing Expiration Date:   07/10/2021    Order Specific Question:   Release to patient    Answer:   Immediate  . Lipid Profile    Standing Status:   Future    Standing Expiration Date:   07/10/2021    Order Specific Question:   Release to patient    Answer:   Immediate    Meds ordered this encounter  Medications  . rosuvastatin (CRESTOR) 10 MG tablet    Sig: Take 1 tablet (10 mg total) by mouth daily.    Dispense:  90 tablet    Refill:  3

## 2020-07-10 NOTE — Telephone Encounter (Signed)
-----   Message from Larey Dresser, MD sent at 07/09/2020  5:55 PM EDT ----- Very high LDL.  Would be a good idea to start on Crestor 10 mg daily with lipids/LFTs in 2 months.

## 2020-07-12 ENCOUNTER — Other Ambulatory Visit: Payer: Self-pay | Admitting: *Deleted

## 2020-07-12 DIAGNOSIS — C50312 Malignant neoplasm of lower-inner quadrant of left female breast: Secondary | ICD-10-CM

## 2020-07-12 DIAGNOSIS — C7931 Secondary malignant neoplasm of brain: Secondary | ICD-10-CM

## 2020-07-19 ENCOUNTER — Ambulatory Visit: Payer: Medicaid Other | Attending: Oncology

## 2020-07-19 ENCOUNTER — Other Ambulatory Visit: Payer: Self-pay

## 2020-07-19 DIAGNOSIS — M6281 Muscle weakness (generalized): Secondary | ICD-10-CM | POA: Diagnosis present

## 2020-07-19 DIAGNOSIS — R2689 Other abnormalities of gait and mobility: Secondary | ICD-10-CM

## 2020-07-19 DIAGNOSIS — R2681 Unsteadiness on feet: Secondary | ICD-10-CM | POA: Diagnosis present

## 2020-07-19 NOTE — Therapy (Signed)
[facility] [address] [place], [place], [address] Phone: [phone]   Fax:  [phone]  Physical Therapy Evaluation  Patient Details  Name: [name] MRN: [id number] Date of Birth: 1961-06-04 Referring Provider (PT): Lurline Del   Encounter Date: 07/19/2020   PT End of Session - 07/19/20 0933    Visit Number 1    Number of Visits 13    Authorization Type Medicaid ( pt used 8 visits earlier this year in this clinic)    PT Start Time 0848    PT Stop Time 0934    PT Time Calculation (min) 46 min    Equipment Utilized During Treatment Gait belt    Activity Tolerance Patient tolerated treatment well    Behavior During Therapy [facility] for tasks assessed/performed           Past Medical History:  Diagnosis Date  . Alcohol abuse    none since 2013  . Anemia    during chemo  . Anxiety    At age 59  . Arthritis Dx 2010  . Bipolar disorder ([place])   . Bronchitis   . Cancer ([place])    breast mets to brain  . Chronic pain    resolved per patient 12/25/19  . Complication of anesthesia   . Depression   . Family history of adverse reaction to anesthesia    MOther had PONV  . GERD (gastroesophageal reflux disease)   . Headache    hx  migraines  . Lymphedema of left arm   . Opiate dependence ([place])   . PONV (postoperative nausea and vomiting)   . Port-A-Cath in place   . PTSD (post-traumatic stress disorder)   . S/P endometrial ablation    in md's office    Past Surgical History:  Procedure Laterality Date  . APPLICATION OF CRANIAL NAVIGATION N/A 08/14/2016   Procedure: APPLICATION OF CRANIAL NAVIGATION;  Surgeon: Erline Levine, MD;  Location: [place] [facility];  Service: Neurosurgery;  Laterality: N/A;  . BREAST RECONSTRUCTION Left    with silicone implant  . COLONOSCOPY W/ POLYPECTOMY    . CRANIOTOMY N/A 08/14/2016   Procedure: CRANIOTOMY TUMOR EXCISION WITH Lucky Rathke;  Surgeon: Erline Levine, MD;  Location: [place]  NEURO ORS;  Service: Neurosurgery;  Laterality: N/A;  . FIBULA FRACTURE SURGERY Left   . MASTECTOMY Left   . RADIOLOGY WITH ANESTHESIA N/A 07/23/2016   Procedure: MRI OF BRAIN WITH AND WITHOUT;  Surgeon: Medication Radiologist, MD;  Location: [place];  Service: Radiology;  Laterality: N/A;  . RADIOLOGY WITH ANESTHESIA N/A 09/08/2016   Procedure: MRI OF BRAIN WITH AND WITHOUT CONTRAST;  Surgeon: Medication Radiologist, MD;  Location: [place];  Service: Radiology;  Laterality: N/A;  . RADIOLOGY WITH ANESTHESIA N/A 12/10/2016   Procedure: MRI OF BRAIN WITH AND WITHOUT;  Surgeon: Medication Radiologist, MD;  Location: [place];  Service: Radiology;  Laterality: N/A;  . RADIOLOGY WITH ANESTHESIA N/A 03/02/2017   Procedure: MRI of BRAIN W and W/OUT CONTRAST;  Surgeon: Medication Radiologist, MD;  Location: [place];  Service: Radiology;  Laterality: N/A;  . RADIOLOGY WITH ANESTHESIA N/A 07/29/2017   Procedure: RADIOLOGY WITH ANESTHESIA MRI OF BRAIN WITH AND WITHOUT CONTRAST;  Surgeon: Radiologist, Medication, MD;  Location: [place];  Service: Radiology;  Laterality: N/A;  . RADIOLOGY WITH ANESTHESIA N/A 12/07/2017   Procedure: MRI WITH ANESTHESIA OF BRAIN WITH AND WITHOUT CONTRAST;  Surgeon: Radiologist, Medication, MD;  Location: [place];  Service: Radiology;  Laterality: N/A;  .  RADIOLOGY WITH ANESTHESIA N/A 04/07/2018   Procedure: MRI OF BRAIN WITH AND WITHOUT CONTRAST;  Surgeon: Radiologist, Medication, MD;  Location: [place];  Service: Radiology;  Laterality: N/A;  . RADIOLOGY WITH ANESTHESIA N/A 08/23/2018   Procedure: MRI WITH ANESTHESIA OF THE BRAIN WITH AND WITHOUT;  Surgeon: Radiologist, Medication, MD;  Location: [place];  Service: Radiology;  Laterality: N/A;  . RADIOLOGY WITH ANESTHESIA N/A 01/24/2019   Procedure: MRI OF BRAIN WITH AND WITHOUT CONTRAST;  Surgeon: Radiologist, Medication, MD;  Location: [facility];  Service: Radiology;  Laterality: N/A;  . RADIOLOGY WITH ANESTHESIA N/A 07/06/2019   Procedure: MRI WITH  ANESTHESIA OF BRAIN WITH AND WITHOUT CONTRAST;  Surgeon: Radiologist, Medication, MD;  Location: [place];  Service: Radiology;  Laterality: N/A;  . RADIOLOGY WITH ANESTHESIA N/A 01/11/2020   Procedure: MRI WITH ANESTHESIA BRAIN WITH AND WITHOUT;  Surgeon: Radiologist, Medication, MD;  Location: [place];  Service: Radiology;  Laterality: N/A;  . RADIOLOGY WITH ANESTHESIA N/A 05/21/2020   Procedure: MRI WITH ANESTHESIA OF BRAIN WITH AND WITHOUT CONTRAST;  Surgeon: Radiologist, Medication, MD;  Location: [place];  Service: Radiology;  Laterality: N/A;  . right power port placement Right     There were no vitals filed for this visit.    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 s/p SRT/Radiation for brain mets now with balance issues. Pt was first diagnosed with breast cancer in 2016 and then mets in 2018 time period. Pt reports that balance issues have gotten worse more recently. Pt also reports she has some memory issues due to the damage. Pt uses rollator to walk mostly around the house. When she is out in community boyfriend is with her. Pt reports 20+ falls the last 6 months mostly with negotiating step or curb.    Pertinent History PMH: alcohol abuse (none since 2013), anemia (during chemo), anxiety, arthritis, bipoloar, bronchitis, headaches (hx of migraines), lymphedema of left arm, PTSD    Patient Stated Goals Pt wants to be able to walk better and not lean and have rubber legs.    Currently in Pain? Yes    Pain Score 0-No pain    Pain Location Neck    Pain Descriptors / Indicators Aching    Pain Type Chronic pain    Pain Onset More than a month ago    Pain Frequency Intermittent    Aggravating Factors  firt thing in morning    Pain Relieving Factors ibuprofen and flexeril in morning help    Multiple Pain Sites Yes    Pain Score 0    Pain Location Back    Pain Orientation Right     Pain Descriptors / Indicators Aching    Pain Type Chronic pain    Pain Radiating Towards down right leg    Pain Onset More than a month ago    Pain Frequency Intermittent    Pain Relieving Factors ibuprofen and flexeril              [facility] PT Assessment - 07/19/20 0900      Assessment   Medical Diagnosis breast cancer with brain mets    Referring Provider (PT) Sarajane Jews Magrinat    Onset Date/Surgical Date 07/12/20    Hand Dominance Right    Prior Therapy outpatient PT earlier this year      Precautions   Precautions Fall  Balance Screen   Has the patient fallen in the past 6 months Yes    How many times? 20    Has the patient had a decrease in activity level because of a fear of falling?  Yes    Is the patient reluctant to leave their home because of a fear of falling?  No      Home Environment   Living Environment Private residence    Living Arrangements Alone    Type of [place] to enter    Entrance Stairs-Number of Steps 4    Entrance Stairs-Rails Right;Left;Can reach both    [place] One level    [place] - 4 wheels    Additional Comments doesn't usually leave home unless boyfriend helping as he carries walker up/down steps.  Has fallen twice in tub shower. No shower chair      Prior Function   Level of Independence Independent with basic ADLs;Independent with household mobility with device    Vocation On disability      Cognition   Overall Cognitive Status Impaired/Different from baseline    Area of Impairment Memory    Memory Impaired    Memory Impairment Decreased short term memory      Observation/Other Assessments   Observations reports needs new glasses as vision not great    Skin Integrity scab right anterior knee, bruise left anterior knee      Observation/Other Assessments-Edema    Edema --   lymphedema left arm from masectomy     Sensation   Light Touch Appears Intact      Coordination   Gross  Motor Movements are Fluid and Coordinated Yes   RAMs intact, heel/shin intact   Fine Motor Movements are Fluid and Coordinated Yes   finger opposition intact     ROM / Strength   AROM / PROM / Strength Strength      Strength   Strength Assessment Site Shoulder;Elbow;Hand;Hip;Knee;Ankle    Right/Left Shoulder Right;Left    Right Shoulder Flexion 4/5    Left Shoulder Flexion 4/5    Right/Left Elbow Right;Left    Right Elbow Flexion 4/5    Right Elbow Extension 4/5    Left Elbow Flexion 4/5    Right/Left hand Right;Left    Right Hand Gross Grasp Functional    Left Hand Gross Grasp Functional    Right/Left Hip Right;Left    Right Hip Flexion 4/5    Right Hip ABduction 4/5    Left Hip Flexion 4/5    Left Hip ABduction 4/5    Right/Left Knee Right;Left    Right Knee Flexion 4/5    Right Knee Extension 4/5    Left Knee Flexion 4/5    Left Knee Extension 4/5    Right/Left Ankle Right;Left    Right Ankle Dorsiflexion 4/5    Left Ankle Dorsiflexion 4/5      Transfers   Transfers Sit to Stand;Stand to Sit    Sit to Stand 5: Supervision    Five time sit to stand comments  20.36 sec from chair with hands    Stand to Sit 5: Supervision    Comments Pt was instructed to turn all the way prior to sitting and feel chair on legs. Then lock rollator and reach back prior to sitting. Pt was turning and reaching with her bottom.      Ambulation/Gait   Ambulation/Gait Yes    Ambulation/Gait Assistance 5: Supervision  Ambulation/Gait Assistance Details Pt has decreased heel strike bilateral.    Ambulation Distance (Feet) 75 Feet    Assistive device Rollator    Gait Pattern Step-through pattern;Decreased step length - right;Decreased step length - left    Ambulation Surface Level;Indoor    Gait velocity 16.44 sec=0.51m/s      Standardized Balance Assessment   Standardized Balance Assessment Berg Balance Test      Berg Balance Test   Sit to Stand Able to stand  independently using hands      Standing Unsupported Able to stand 2 minutes with supervision    Sitting with Back Unsupported but Feet Supported on Floor or Stool Able to sit safely and securely 2 minutes    Stand to Sit Controls descent by using hands    Transfers Able to transfer with verbal cueing and /or supervision    Standing Unsupported with Eyes Closed Able to stand 3 seconds    Standing Unsupported with Feet Together Needs help to attain position but able to stand for 30 seconds with feet together    From Standing, Reach Forward with Outstretched Arm Can reach forward >12 cm safely (5")    From Standing Position, Pick up Object from Floor Unable to try/needs assist to keep balance    From Standing Position, Turn to Look Behind Over each Shoulder Needs supervision when turning    Turn 360 Degrees Needs assistance while turning    Standing Unsupported, Alternately Place Feet on Step/Stool Able to complete >2 steps/needs minimal assist    Standing Unsupported, One Foot in Front Needs help to step but can hold 15 seconds    Standing on One Leg Tries to lift leg/unable to hold 3 seconds but remains standing independently    Total Score 25                      Objective measurements completed on examination: See above findings.               PT Education - 07/19/20 1045    Education Details PT plan of care. Discussed PT asking for order for tub bench due to multiple falls in tub shower. Transfer safety.    Person(s) Educated Patient    Methods Explanation    Comprehension Verbalized understanding            PT Short Term Goals - 07/19/20 1758      PT SHORT TERM GOAL #1   Title Pt will be independent with progressive HEP for strength and balance to continue at home.    Baseline Pt has not been consistently working on exercises.    Time 3    Period Weeks    Status New    Target Date 08/09/20      PT SHORT TERM GOAL #2   Title Pt will decrease 5 x sit to stand from 20.36 sec to  <15 sec for improved balance and functional strength.    Baseline 20.36 sec from chair with arms    Time 3    Period Weeks    Status New    Target Date 08/09/20      PT SHORT TERM GOAL #3   Title Pt will be able to tolerate 5 min of standing activities during session without seated break for improved activity participation around the house.    Baseline 07/19/20 2 min standing and then needed to sit.    Time 3    Period Weeks  Status New    Target Date 08/09/20             PT Long Term Goals - 07/19/20 1802      PT LONG TERM GOAL #1   Title Pt will be able to ambulate >1000' with rollator mod I on level indoor and paved outdoor surfaces for improved community mobility.    Baseline supervision on level surfaces 100'    Time 8    Period Weeks    Status New    Target Date 09/17/20      PT LONG TERM GOAL #2   Title Pt will increase Berg Balance from 25/56 to >35/56 for improved balance and decreased fall risk.    Baseline 25/56 on 07/19/20    Time 8    Period Weeks    Status New    Target Date 09/17/20      PT LONG TERM GOAL #3   Title Pt will increase gait speed from 0.55m/s to >0.47m/s for improved gait safety.    Baseline 0.4m/s on 07/19/20    Time 8    Period Weeks    Status New    Target Date 09/17/20      PT LONG TERM GOAL #4   Title Pt will ambulate up/down 4 steps with bilateral rails supervision for improved safety with household access.    Baseline TBD but pt reports multiple falls on steps.    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 s/p SRT/Radiation for brain mets now with balance issues. Pt was seen earlier this year at this clinic as was doing much better on testing at that time. She is currently a very high fall risk with Berg  Balance score of 25/56 and 5 x sit to stand of 20.36 sec. She reports 20+ falls in last 6 months. Pt is ambulating with rollator with slow gait speed of 0.74m/s indicating decreased safety with community ambulation. Pt's strength grossly 4/5 throughout but fatigues quickly in standing. Pt will benefit from skilled PT to address strength, balance and functional mobility deficits.    Personal Factors and Comorbidities Time since onset of injury/illness/exacerbation;Comorbidity 3+    Comorbidities alcohol abuse (none since 2013), anemia (during chemo), anxiety, arthritis, bipoloar, bronchitis, headaches (hx of migraines), lymphedema of left arm, PTSD    Examination-Activity Limitations Bathing;Stand;Locomotion Level;Stairs    Examination-Participation Restrictions Community Activity;Cleaning;Meal Prep    Stability/Clinical Decision Making Evolving/Moderate complexity    Clinical Decision Making Moderate    Rehab Potential Good    PT Frequency 1x / week   followed by 2x/week for 4 weeks then 1x/week for 1 week   PT Duration 3 weeks    PT Treatment/Interventions ADLs/Self Care Home Management;Aquatic Therapy;DME Instruction;Gait training;Stair training;Functional mobility training;Therapeutic activities;Therapeutic exercise;Patient/family education;Neuromuscular re-education;Balance training;Vestibular;Manual techniques;Energy conservation    PT Next Visit Plan Assess pt on stairs. She has 4 stairs with bilateral rails on home but can't get rollator up/down on her own. Begin balance training. Transfer training to see if carryover with safety instruction at eval.    Recommended Other Services PT will request order for tub bench from MD    Consulted and Agree with  Plan of Care Patient           Patient will benefit from skilled therapeutic intervention in order to improve the following deficits and impairments:  Abnormal gait, Decreased activity tolerance, Decreased balance, Decreased endurance,  Decreased mobility, Decreased knowledge of use of DME, Decreased strength  Visit Diagnosis: Other abnormalities of gait and mobility  Muscle weakness (generalized)  Unsteadiness on feet     Problem List Patient Active Problem List   Diagnosis Date Noted  . Brain metastases ([place]) 11/01/2019  . Thrombocytopenia ([place]) 08/09/2019  . Port-A-Cath in place 06/20/2019  . 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[place]) 03/30/2019  . S/P mastectomy, left 12/09/2018  . S/P breast reconstruction, left 12/09/2018  . S/P radiation therapy 12/09/2018  . GERD (gastroesophageal reflux disease) 12/10/2017  . Edema 11/09/2017  . Sensorineural hearing loss (SNHL) of both ears 11/05/2017  . Vitamin D deficiency 01/18/2017  . Bipolar I disorder, most recent episode depressed ([place]) 12/08/2016  . Adjustment disorder with anxiety 12/06/2016  . History of cancer metastatic to brain 07/27/2016  . Iron deficiency anemia 06/26/2016  . Bone metastases ([place]) 06/03/2016  . Primary cancer of lower-inner quadrant of left female breast ([place]) 06/01/2016  . Tobacco abuse 07/22/2015  . Alcoholism in remission ([place]) 07/22/2015  . Malignant neoplasm of female breast ([place]) 07/01/2015  . Current smoker 03/28/2015  . Goals of care, counseling/discussion 03/28/2015  . Seasonal allergies 03/28/2015  . Anxiety state 02/28/2015  . Fibromyalgia 02/28/2015  . Family history of diabetes mellitus 02/28/2015  . H/O alcohol abuse     Electa Sniff, PT, DPT, NCS 07/19/2020, 6:08 PM  [facility] [address] [place], [place], [address] Phone: [phone]   Fax:  [phone]  Name: Lanay Zinda MRN: [id number] Date of Birth: 12-27-1960

## 2020-07-20 ENCOUNTER — Other Ambulatory Visit: Payer: Self-pay | Admitting: Oncology

## 2020-07-22 ENCOUNTER — Ambulatory Visit: Payer: Medicaid Other

## 2020-07-25 ENCOUNTER — Telehealth: Payer: Self-pay

## 2020-07-25 NOTE — Telephone Encounter (Signed)
Dr. [name], [name] is being treated by physical therapy for balance deficits from breast cancer with brain mets.  She will benefit from use of a tub bench in order to improve safety with functional mobility.   Please submit order in epic or fax to [phone].   Thank you, Cherly Anderson, PT, DPT, NCS

## 2020-07-26 ENCOUNTER — Telehealth: Payer: Self-pay

## 2020-07-26 ENCOUNTER — Ambulatory Visit: Payer: Self-pay

## 2020-07-26 ENCOUNTER — Telehealth (HOSPITAL_COMMUNITY): Payer: Self-pay | Admitting: *Deleted

## 2020-07-26 ENCOUNTER — Other Ambulatory Visit: Payer: Self-pay

## 2020-07-26 ENCOUNTER — Ambulatory Visit: Payer: Medicaid Other

## 2020-07-26 DIAGNOSIS — R2689 Other abnormalities of gait and mobility: Secondary | ICD-10-CM | POA: Diagnosis not present

## 2020-07-26 DIAGNOSIS — R2681 Unsteadiness on feet: Secondary | ICD-10-CM

## 2020-07-26 DIAGNOSIS — M6281 Muscle weakness (generalized): Secondary | ICD-10-CM

## 2020-07-26 NOTE — Therapy (Signed)
[facility] [address] [place], [place], [address] Phone: [phone]   Fax:  [phone]  Physical Therapy Treatment  Patient Details  Name: [name] MRN: [id number] Date of Birth: 1961/07/19 Referring Provider (PT): Lurline Del   Encounter Date: 07/26/2020   PT End of Session - 07/26/20 0800    Visit Number 2    Number of Visits 13    Authorization Type Medicaid ( pt used 8 visits earlier this year in this clinic)    PT Start Time 0800    PT Stop Time 0844    PT Time Calculation (min) 44 min    Equipment Utilized During Treatment Gait belt    Activity Tolerance Patient tolerated treatment well    Behavior During Therapy [facility] for tasks assessed/performed           Past Medical History:  Diagnosis Date  . Alcohol abuse    none since 2013  . Anemia    during chemo  . Anxiety    At age 56  . Arthritis Dx 2010  . Bipolar disorder ([place])   . Bronchitis   . Cancer ([place])    breast mets to brain  . Chronic pain    resolved per patient 12/25/19  . Complication of anesthesia   . Depression   . Family history of adverse reaction to anesthesia    MOther had PONV  . GERD (gastroesophageal reflux disease)   . Headache    hx  migraines  . Lymphedema of left arm   . Opiate dependence ([place])   . PONV (postoperative nausea and vomiting)   . Port-A-Cath in place   . PTSD (post-traumatic stress disorder)   . S/P endometrial ablation    in md's office    Past Surgical History:  Procedure Laterality Date  . APPLICATION OF CRANIAL NAVIGATION N/A 08/14/2016   Procedure: APPLICATION OF CRANIAL NAVIGATION;  Surgeon: Erline Levine, MD;  Location: [place] NEURO ORS;  Service: Neurosurgery;  Laterality: N/A;  . BREAST RECONSTRUCTION Left    with silicone implant  . COLONOSCOPY W/ POLYPECTOMY    . CRANIOTOMY N/A 08/14/2016   Procedure: CRANIOTOMY TUMOR EXCISION WITH Lucky Rathke;  Surgeon: Erline Levine, MD;  Location: [place]  NEURO ORS;  Service: Neurosurgery;  Laterality: N/A;  . FIBULA FRACTURE SURGERY Left   . MASTECTOMY Left   . RADIOLOGY WITH ANESTHESIA N/A 07/23/2016   Procedure: MRI OF BRAIN WITH AND WITHOUT;  Surgeon: Medication Radiologist, MD;  Location: [facility];  Service: Radiology;  Laterality: N/A;  . RADIOLOGY WITH ANESTHESIA N/A 09/08/2016   Procedure: MRI OF BRAIN WITH AND WITHOUT CONTRAST;  Surgeon: Medication Radiologist, MD;  Location: [place];  Service: Radiology;  Laterality: N/A;  . RADIOLOGY WITH ANESTHESIA N/A 12/10/2016   Procedure: MRI OF BRAIN WITH AND WITHOUT;  Surgeon: Medication Radiologist, MD;  Location: [facility];  Service: Radiology;  Laterality: N/A;  . RADIOLOGY WITH ANESTHESIA N/A 03/02/2017   Procedure: MRI of BRAIN W and W/OUT CONTRAST;  Surgeon: Medication Radiologist, MD;  Location: [place];  Service: Radiology;  Laterality: N/A;  . RADIOLOGY WITH ANESTHESIA N/A 07/29/2017   Procedure: RADIOLOGY WITH ANESTHESIA MRI OF BRAIN WITH AND WITHOUT CONTRAST;  Surgeon: Radiologist, Medication, MD;  Location: [place];  Service: Radiology;  Laterality: N/A;  . RADIOLOGY WITH ANESTHESIA N/A 12/07/2017   Procedure: MRI WITH ANESTHESIA OF BRAIN WITH AND WITHOUT CONTRAST;  Surgeon: Radiologist, Medication, MD;  Location: [place];  Service: Radiology;  Laterality: N/A;  .  RADIOLOGY WITH ANESTHESIA N/A 04/07/2018   Procedure: MRI OF BRAIN WITH AND WITHOUT CONTRAST;  Surgeon: Radiologist, Medication, MD;  Location: [facility];  Service: Radiology;  Laterality: N/A;  . RADIOLOGY WITH ANESTHESIA N/A 08/23/2018   Procedure: MRI WITH ANESTHESIA OF THE BRAIN WITH AND WITHOUT;  Surgeon: Radiologist, Medication, MD;  Location: [facility];  Service: Radiology;  Laterality: N/A;  . RADIOLOGY WITH ANESTHESIA N/A 01/24/2019   Procedure: MRI OF BRAIN WITH AND WITHOUT CONTRAST;  Surgeon: Radiologist, Medication, MD;  Location: [place];  Service: Radiology;  Laterality: N/A;  . RADIOLOGY WITH ANESTHESIA N/A 07/06/2019   Procedure: MRI WITH  ANESTHESIA OF BRAIN WITH AND WITHOUT CONTRAST;  Surgeon: Radiologist, Medication, MD;  Location: [facility];  Service: Radiology;  Laterality: N/A;  . RADIOLOGY WITH ANESTHESIA N/A 01/11/2020   Procedure: MRI WITH ANESTHESIA BRAIN WITH AND WITHOUT;  Surgeon: Radiologist, Medication, MD;  Location: [place];  Service: Radiology;  Laterality: N/A;  . RADIOLOGY WITH ANESTHESIA N/A 05/21/2020   Procedure: MRI WITH ANESTHESIA OF BRAIN WITH AND WITHOUT CONTRAST;  Surgeon: Radiologist, Medication, MD;  Location: [place];  Service: Radiology;  Laterality: N/A;  . right power port placement Right     There were no vitals filed for this visit.   Subjective Assessment - 07/26/20 0800    Subjective Pt reports that she had a fall yesterday as legs buckled on her when she caught her foot. She fell on her bottom and reports bruise to right bottom and sore.Was able to get up on her own.    Pertinent History PMH: alcohol abuse (none since 2013), anemia (during chemo), anxiety, arthritis, bipoloar, bronchitis, headaches (hx of migraines), lymphedema of left arm, PTSD    Patient Stated Goals Pt wants to be able to walk better and not lean and have rubber legs.    Currently in Pain? Yes    Pain Score 4     Pain Location Buttocks    Pain Orientation Left    Pain Descriptors / Indicators Sore    Pain Type Acute pain    Pain Onset More than a month ago    Pain Frequency Constant    Aggravating Factors  from fall yesterday, laying on side    Pain Relieving Factors ibuprofen and flexeril    Pain Onset More than a month ago                             [facility] Adult PT Treatment/Exercise - 07/26/20 0803      Transfers   Transfers Sit to Stand;Stand to Sit    Sit to Stand 5: Supervision;4: Min guard    Sit to Stand Details Verbal cues for technique;Verbal cues for precautions/safety    Stand to Sit 5: Supervision;4: Min guard    Stand to Sit Details (indicate cue type and reason) Verbal cues for  technique;Verbal cues for precautions/safety    Comments Performed from mat and from rollator. Pt needed max cues for hand placement and to lock rollator properly. Unable to show carryover throughout session.      Ambulation/Gait   Ambulation/Gait Yes    Ambulation/Gait Assistance 5: Supervision;4: Min guard    Ambulation/Gait Assistance Details Pt with short, shuffled steps. Cued to try to increase and get more heel strike with short, shuffled steps and as went on walker would get too far in front. Pt was cued to keep walker close. Became less steady as went on  needing more assistance.    Ambulation Distance (Feet) 115 Feet    Assistive device Rollator    Gait Pattern Step-through pattern;Decreased step length - right;Decreased step length - left;Decreased dorsiflexion - right;Decreased dorsiflexion - left;Decreased hip/knee flexion - right;Decreased hip/knee flexion - left;Right flexed knee in stance;Left flexed knee in stance    Ambulation Surface Level;Indoor    Stairs Yes    Stairs Assistance 4: Min guard;4: Min assist    Stairs Assistance Details (indicate cue type and reason) Pt catches toes at times. Was performing reciprocal pattern initially and PT cued to try step-to pattern to slow her down and help focus on clearing feet as was catching toes on steps at times. Pt reported no change with reciprocal versus step-to but did seem safer although she was inconsistent with performance.    Stair Management Technique One rail Right;Alternating pattern;Step to pattern;Two rails    Number of Stairs 8    Gait Comments Pt performed alternating toe taps on bottom step x 10 each leg working on trying to clear leg fully.       Self-Care   Self-Care Other Self-Care Comments    Other Self-Care Comments  PT discussed with patient the importance of always using walker in home as very high fall risk. Also discussed with pt if there was anyway to get more help at home as she really needed supervision at  all times when up. Discussed lifeline and will try to find resources for her next visit. Advised to keep phone on her person at all times though for safety. Pt stated ok.      Neuro Re-ed    Neuro Re-ed Details  In // bars: Standing with feet together x 20 sec eyes open CGA with increased sway then with feet apart x 30 sec. Feet apart with alternating shoulder flexion x 5 with increased forward lean as she went on min assist. Forward march x 6' then backwards gait x 6'. Side stepping along // bars 6' x 2 with bilateral UE support. Alternating toe taps on cone x 10. Pt fearful with balance activities and relied heavily on therapist. Needed multiple rest breaks throughout. Backwards gait bothered left hip/low back so stopped.                  PT Education - 07/26/20 1148    Education Details See self care section. Educated on fall prevention and safety measures for home.    Person(s) Educated Patient    Methods Explanation    Comprehension Verbalized understanding;Need further instruction            PT Short Term Goals - 07/19/20 1758      PT SHORT TERM GOAL #1   Title Pt will be independent with progressive HEP for strength and balance to continue at home.    Baseline Pt has not been consistently working on exercises.    Time 3    Period Weeks    Status New    Target Date 08/09/20      PT SHORT TERM GOAL #2   Title Pt will decrease 5 x sit to stand from 20.36 sec to <15 sec for improved balance and functional strength.    Baseline 20.36 sec from chair with arms    Time 3    Period Weeks    Status New    Target Date 08/09/20      PT SHORT TERM GOAL #3   Title Pt will be able to tolerate  5 min of standing activities during session without seated break for improved activity participation around the house.    Baseline 07/19/20 2 min standing and then needed to sit.    Time 3    Period Weeks    Status New    Target Date 08/09/20             PT Long Term Goals -  07/19/20 1802      PT LONG TERM GOAL #1   Title Pt will be able to ambulate >1000' with rollator mod I on level indoor and paved outdoor surfaces for improved community mobility.    Baseline supervision on level surfaces 100'    Time 8    Period Weeks    Status New    Target Date 09/17/20      PT LONG TERM GOAL #2   Title Pt will increase Berg Balance from 25/56 to >35/56 for improved balance and decreased fall risk.    Baseline 25/56 on 07/19/20    Time 8    Period Weeks    Status New    Target Date 09/17/20      PT LONG TERM GOAL #3   Title Pt will increase gait speed from 0.29m/s to >0.41m/s for improved gait safety.    Baseline 0.59m/s on 07/19/20    Time 8    Period Weeks    Status New    Target Date 09/17/20      PT LONG TERM GOAL #4   Title Pt will ambulate up/down 4 steps with bilateral rails supervision for improved safety with household access.    Baseline TBD but pt reports multiple falls on steps.    Time 8    Period Weeks    Status New    Target Date 09/17/20                 Plan - 07/26/20 1149    Clinical Impression Statement Pt continues to be very unsteady with gait and balance activities. Pt with fall yesterday. PT concerned that pt needs more help in home as lives alone. Will reach out to MD to discuss concerns. Pt also has significant cognitive deficits noting that she can not remember things well and asking therapist what they were talking about when she just asked the question.    Personal Factors and Comorbidities Time since onset of injury/illness/exacerbation;Comorbidity 3+    Comorbidities alcohol abuse (none since 2013), anemia (during chemo), anxiety, arthritis, bipoloar, bronchitis, headaches (hx of migraines), lymphedema of left arm, PTSD    Examination-Activity Limitations Bathing;Stand;Locomotion Level;Stairs    Examination-Participation Restrictions Community Activity;Cleaning;Meal Prep    Stability/Clinical Decision Making  Evolving/Moderate complexity    Rehab Potential Good    PT Frequency 1x / week   followed by 2x/week for 4 weeks then 1x/week for 1 week   PT Duration 3 weeks    PT Treatment/Interventions ADLs/Self Care Home Management;Aquatic Therapy;DME Instruction;Gait training;Stair training;Functional mobility training;Therapeutic activities;Therapeutic exercise;Patient/family education;Neuromuscular re-education;Balance training;Vestibular;Manual techniques;Energy conservation    PT Next Visit Plan Did order come back for tub bench? Continue with fall prevention training, balance training. gait and transfers. Pt showed poor carryover between eval and 1st session with cognitive deficits.    Consulted and Agree with Plan of Care Patient           Patient will benefit from skilled therapeutic intervention in order to improve the following deficits and impairments:  Abnormal gait, Decreased activity tolerance, Decreased balance, Decreased endurance, Decreased mobility,  Decreased knowledge of use of DME, Decreased strength  Visit Diagnosis: Other abnormalities of gait and mobility  Muscle weakness (generalized)  Unsteadiness on feet     Problem List Patient Active Problem List   Diagnosis Date Noted  . Brain metastases ([place]) 11/01/2019  . Thrombocytopenia ([place]) 08/09/2019  . Port-A-Cath in place 06/20/2019  . 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[place]) 03/30/2019  . S/P mastectomy, left 12/09/2018  . S/P breast reconstruction, left 12/09/2018  . S/P radiation therapy 12/09/2018  . GERD (gastroesophageal reflux disease) 12/10/2017  . Edema 11/09/2017  . Sensorineural hearing loss (SNHL) of both ears 11/05/2017  . Vitamin D deficiency 01/18/2017  . Bipolar I disorder, most recent episode depressed ([place]) 12/08/2016  . Adjustment disorder with anxiety 12/06/2016  . History of cancer metastatic to brain 07/27/2016  . Iron deficiency anemia 06/26/2016  . Bone metastases ([place])  06/03/2016  . Primary cancer of lower-inner quadrant of left female breast ([place]) 06/01/2016  . Tobacco abuse 07/22/2015  . Alcoholism in remission ([place]) 07/22/2015  . Malignant neoplasm of female breast ([place]) 07/01/2015  . Current smoker 03/28/2015  . Goals of care, counseling/discussion 03/28/2015  . Seasonal allergies 03/28/2015  . Anxiety state 02/28/2015  . Fibromyalgia 02/28/2015  . Family history of diabetes mellitus 02/28/2015  . H/O alcohol abuse     Electa Sniff, PT, DPT, NCS 07/26/2020, 11:53 AM  [facility] [address] [place], [place], [address] Phone: [phone]   Fax:  [phone]  Name: Lasonja Lakins MRN: [id number] Date of Birth: 03/03/1961

## 2020-07-26 NOTE — Telephone Encounter (Signed)
Dr. [name], I am very concerned about the safety of this patient. I have seen [name] for PT eval and 1 treatment and she has already had another fall at home. She lives alone and has stairs to enter. She can not perform these safely without assistance and can only leave if someone carries her walker up/down. I looked back on her PT chart from when she was here in April of this year and her [name] Balance has decreased from 48/56 to 25/56. She is an extremely high fall risk. She is also showing cognitive issues and unable to show carryover with safety instructions. She would forget what question she asked me while I was answering it. Do you see her soon for follow-up? I'm wondering if there is a way to get social work involved to see if more help is available. What was the most recent status of her brain mets? Is this a progression you are expecting? Thanks so much for any help you can offer. [name], PT, DPT, NCS

## 2020-07-26 NOTE — Telephone Encounter (Signed)
Patient called stating that she has been incontinent of  BM's in the middle of the night.  She states they are normal color and mushy.  She states it happens every night for about 2 weeks. She is not constipated. She states that she is totally unaware that she has gone. She has stage 4 breast Ca but has had no treatments for several years. She has had several falls. Last one hurt but she does not feel numbness to the rectal area when she wipes.  No abdominal pain. Per protocol patient was asked to go to UC for evaluation.  She refused stating that she will call oncologist to see if there is anything that could be causing her problem. Patient has appointment in office in September and is on wait list. Care advice read to patient. She verbalized understanding but refuses UC  Reason for Disposition . [1] MILD diarrhea (e.g., 1-3 or more stools than normal in past 24 hours) without known cause AND [2] present >  7 days  Answer Assessment - Initial Assessment Questions 1. DIARRHEA SEVERITY: "How bad is the diarrhea?" "How many extra stools have you had in the past 24 hours than normal?"    - NO DIARRHEA (SCALE 0)   - MILD (SCALE 1-3): Few loose or mushy BMs; increase of 1-3 stools over normal daily number of stools; mild increase in ostomy output.   -  MODERATE (SCALE 4-7): Increase of 4-6 stools daily over normal; moderate increase in ostomy output. * SEVERE (SCALE 8-10; OR 'WORST POSSIBLE'): Increase of 7 or more stools daily over normal; moderate increase in ostomy output; incontinence.     1 mushy at night 2. ONSET: "When did the diarrhea begin?"     2 weeks ago 3. BM CONSISTENCY: "How loose or watery is the diarrhea?"     mushy 4. VOMITING: "Are you also vomiting?" If Yes, ask: "How many times in the past 24 hours?"      no 5. ABDOMINAL PAIN: "Are you having any abdominal pain?" If Yes, ask: "What does it feel like?" (e.g., crampy, dull, intermittent, constant)     None does not feel BM come  on 6. ABDOMINAL PAIN SEVERITY: If present, ask: "How bad is the pain?"  (e.g., Scale 1-10; mild, moderate, or severe)   - MILD (1-3): doesn't interfere with normal activities, abdomen soft and not tender to touch    - MODERATE (4-7): interferes with normal activities or awakens from sleep, tender to touch    - SEVERE (8-10): excruciating pain, doubled over, unable to do any normal activities      None 7. ORAL INTAKE: If vomiting, "Have you been able to drink liquids?" "How much fluids have you had in the past 24 hours?"     Yes not vomiting 8. HYDRATION: "Any signs of dehydration?" (e.g., dry mouth [not just dry lips], too weak to stand, dizziness, new weight loss) "When did you last urinate?"     nothing  9. EXPOSURE: "Have you traveled to a foreign country recently?" "Have you been exposed to anyone with diarrhea?" "Could you have eaten any food that was spoiled?"     no 10. ANTIBIOTIC USE: "Are you taking antibiotics now or have you taken antibiotics in the past 2 months?"      no 11. OTHER SYMPTOMS: "Do you have any other symptoms?" (e.g., fever, blood in stool)      no 12. PREGNANCY: "Is there any chance you are pregnant?" "When was your last  menstrual period?"     No  Protocols used: DIARRHEA-A-AH

## 2020-07-26 NOTE — Telephone Encounter (Signed)
Pt aware and will contact her pcp.

## 2020-07-26 NOTE — Telephone Encounter (Signed)
Pt left VM stating Dr.McLean started her on a medication and now she's having issues controlling her bowels especially at night when she is sleep. She wants to know if this is a common side effect of the medication if so patient wants to decrease or stop medication.   Routed to [place]

## 2020-07-26 NOTE — Telephone Encounter (Signed)
Will route to PCP for review. 

## 2020-07-26 NOTE — Telephone Encounter (Signed)
Coreg should not affect her bowels.

## 2020-07-29 ENCOUNTER — Other Ambulatory Visit: Payer: Self-pay | Admitting: *Deleted

## 2020-07-29 ENCOUNTER — Encounter: Payer: Self-pay | Admitting: Licensed Clinical Social Worker

## 2020-07-29 ENCOUNTER — Telehealth: Payer: Self-pay | Admitting: Family Medicine

## 2020-07-29 NOTE — Telephone Encounter (Signed)
Attempted to call patient to explore current supports and needs. Potentially could refer for [place] (in-home assistance) or to SNF if medically necessary.  No answer. Left VM with call back number. Patient is scheduled to see NP on 08/01/2020. If patient does not return call, CSW will attempt to see in clinic and will request NP to explore needs with patient as well.

## 2020-07-29 NOTE — Telephone Encounter (Signed)
Thank you. GM 

## 2020-07-29 NOTE — Progress Notes (Signed)
[facility] Work  Clinical Social Work was referred by Dr. [name] for assessment of psychosocial needs.  Clinical Social Worker contacted patient by phone  to offer support and assess for needs.    Patient reports that she had a really bad day last week with "noodle legs" but overall feels she can navigate her apartment and take care of ADL's. CSW reviewed various options for support such as Personal care services, but patient declined at this time. Stated she has friends who can help if needed and denied other assistance needs. Should pt decline physically, can look into PCS or placement if needed.  Patient did note that she needs assistance with transportation to [facility]. CSW entered referral to transportation department.   Patient also expressed financial need. Has already accessed [employer]. CSW will provide [employer] application and sign up for [employer].    Deetta Siegmann, [place], [place] Worker [facility]

## 2020-07-29 NOTE — Telephone Encounter (Signed)
Patient was called and she states that she has already spoken with the doctor in regards to her diarrhea.

## 2020-07-29 NOTE — Telephone Encounter (Signed)
I share your concern, Raquel Sarna. Unfortunately Briar's social situation is poor--basically no local family support. Sharyn Lull, would she qualify for snf? Is that something the patient would want?  The issue is not likely progression of disease, though that could happen--it's just brain deterioration due to prior treatments and the cancer itself  I will be glad to help any way I can. Just let me know what would be helpful  Thanks!  GM

## 2020-07-29 NOTE — Telephone Encounter (Signed)
I would recommend she notify Oncology as she has a history of metastatic breast ca. Thanks

## 2020-07-29 NOTE — Telephone Encounter (Signed)
   [name] DOB: 1961-03-16 MRN: [id number]   RIDER WAIVER AND RELEASE OF LIABILITY  For purposes of improving physical access to our facilities, [facility] is pleased to partner with third parties to provide [facility] patients or other authorized individuals the option of convenient, on-demand ground transportation services (the Technical brewer") through use of the technology service that enables users to request on-demand ground transportation from independent third-party providers.  By opting to use and accept these [employer], I, the undersigned, hereby agree on behalf of myself, and on behalf of any minor child using the [employer] for whom I am the parent or legal guardian, as follows:  1. Government social research officer provided to me are provided by independent third-party transportation providers who are not [employer] or employees and who are unaffiliated with [employer]. 2. [facility] is neither a transportation carrier nor a common or public carrier. 3. [facility] has no control over the quality or safety of the transportation that occurs as a result of the [employer]. 4. [facility] cannot guarantee that any third-party transportation provider will complete any arranged transportation service. 5. [facility] makes no representation, warranty, or guarantee regarding the reliability, timeliness, quality, safety, suitability, or availability of any of the Transport Services or that they will be error free. 6. I fully understand that traveling by vehicle involves risks and dangers of serious bodily injury, including permanent disability, paralysis, and death. I agree, on behalf of myself and on behalf of any minor child using the Transport Services for whom I am the parent or legal guardian, that the entire risk arising out of my use of the [employer] remains solely with me, to the maximum extent permitted under applicable law. 7. The [employer]  are provided "as is" and "as available." [facility] disclaims all representations and warranties, express, implied or statutory, not expressly set out in these terms, including the implied warranties of merchantability and fitness for a particular purpose. 8. I hereby waive and release [facility], its agents, employees, officers, directors, representatives, insurers, attorneys, assigns, successors, subsidiaries, and affiliates from any and all past, present, or future claims, demands, liabilities, actions, causes of action, or suits of any kind directly or indirectly arising from acceptance and use of the [employer]. 9. I further waive and release [facility] and its affiliates from all present and future liability and responsibility for any injury or death to persons or damages to property caused by or related to the use of the [employer]. 10. I have read this Waiver and Release of Liability, and I understand the terms used in it and their legal significance. This Waiver is freely and voluntarily given with the understanding that my right (as well as the right of any minor child for whom I am the parent or legal guardian using the [employer]) to legal recourse against [facility] in connection with the [employer] is knowingly surrendered in return for use of these services.   I attest that I read the consent document to [name], gave [name] the opportunity to ask questions and answered the questions asked (if any). I affirm that [name] then provided consent for she's participation in this program.     Legrand Pitts

## 2020-07-30 ENCOUNTER — Other Ambulatory Visit: Payer: Self-pay | Admitting: *Deleted

## 2020-07-30 NOTE — Telephone Encounter (Signed)
Thank you for reaching out [facility]. I so wish she would at least except PCS services. She has had multiple falls in shower. I put a request in for order for a tub bench to Dr. [name]. Her cognitive issues may also be affecting her ability to make safe decisions at this time. I will talk to her next time I see her about PCS services again to see if she would change her mind.

## 2020-07-31 ENCOUNTER — Ambulatory Visit: Payer: Medicaid Other | Admitting: Physical Therapy

## 2020-07-31 ENCOUNTER — Ambulatory Visit: Payer: Medicaid Other

## 2020-07-31 ENCOUNTER — Other Ambulatory Visit: Payer: Medicaid Other

## 2020-07-31 ENCOUNTER — Encounter: Payer: Self-pay | Admitting: Licensed Clinical Social Worker

## 2020-07-31 ENCOUNTER — Other Ambulatory Visit: Payer: Self-pay | Admitting: *Deleted

## 2020-07-31 ENCOUNTER — Ambulatory Visit: Payer: Medicaid Other | Admitting: Adult Health

## 2020-07-31 NOTE — Progress Notes (Signed)
[facility]   Telephone:([phone]) [phone] [phone]) [phone]   Clinic Follow up Note   Patient Care Team: Charlott Rakes, MD as PCP - General (Family Medicine) Kyung Rudd, MD as Consulting Physician (Radiation Oncology) Erline Levine, MD as Consulting Physician (Neurosurgery) Corena Pilgrim, MD as Consulting Physician (Psychiatry) Mickeal Skinner Acey Lav, MD as Consulting Physician (Psychiatry) Nehemiah Settle, MD as Referring Physician (Plastic Surgery) Irene Limbo, MD as Consulting Physician (Plastic Surgery) Dillingham, Loel Lofty, DO as Attending Physician (Plastic Surgery) Magrinat, Virgie Dad, MD as Consulting Physician (Oncology) 08/01/2020  CHIEF COMPLAINT: F/u metastatic triple positive breast cancer   CURRENT THERAPY: q3 week trastuzumab, Xgeva q6 weeks, daily anastrozole   INTERVAL HISTORY: [name] returns for f/u and treatment as scheduled. She was last seen 06/18/20 and completed another cycle of trastuzumab and Xgeva on 07/10/2020.  Continues PT for her balance which she feels is improving.  However she did fall last week when she tripped over the litter box, she has scattered bruising but no other injuries.  She does not feel she needs home health or assistance at home except she is waiting on a shower chair.  Her energy level is fair, appetite is adequate.  Denies nausea or vomiting.  In the last 3 weeks she has become bowel incontinent at night with soft stools while she is asleep.  Does occasionally have foul odor.  No recent antibiotics, fever, chills, abdominal pain or cramping.  She is continent of urine.  Denies back pain or other neurological changes.  Her frequent headaches are at baseline.  Denies neuropathy in her hands or feet.  Otherwise denies cough, chest pain, dyspnea.   MEDICAL HISTORY:  Past Medical History:  Diagnosis Date  . Alcohol abuse    none since 2013  . Anemia    during chemo  . Anxiety    At age 71  . Arthritis Dx 2010  . Bipolar  disorder ([place])   . Bronchitis   . Cancer ([place])    breast mets to brain  . Chronic pain    resolved per patient 12/25/19  . Complication of anesthesia   . Depression   . Family history of adverse reaction to anesthesia    MOther had PONV  . GERD (gastroesophageal reflux disease)   . Headache    hx  migraines  . Lymphedema of left arm   . Opiate dependence ([place])   . PONV (postoperative nausea and vomiting)   . Port-A-Cath in place   . PTSD (post-traumatic stress disorder)   . S/P endometrial ablation    in md's office    SURGICAL HISTORY: Past Surgical History:  Procedure Laterality Date  . APPLICATION OF CRANIAL NAVIGATION N/A 08/14/2016   Procedure: APPLICATION OF CRANIAL NAVIGATION;  Surgeon: Erline Levine, MD;  Location: [facility];  Service: Neurosurgery;  Laterality: N/A;  . BREAST RECONSTRUCTION Left    with silicone implant  . COLONOSCOPY W/ POLYPECTOMY    . CRANIOTOMY N/A 08/14/2016   Procedure: CRANIOTOMY TUMOR EXCISION WITH Lucky Rathke;  Surgeon: Erline Levine, MD;  Location: [facility];  Service: Neurosurgery;  Laterality: N/A;  . FIBULA FRACTURE SURGERY Left   . MASTECTOMY Left   . RADIOLOGY WITH ANESTHESIA N/A 07/23/2016   Procedure: MRI OF BRAIN WITH AND WITHOUT;  Surgeon: Medication Radiologist, MD;  Location: [facility];  Service: Radiology;  Laterality: N/A;  . RADIOLOGY WITH ANESTHESIA N/A 09/08/2016   Procedure: MRI OF BRAIN WITH AND WITHOUT CONTRAST;  Surgeon: Medication Radiologist,  MD;  Location: [facility];  Service: Radiology;  Laterality: N/A;  . RADIOLOGY WITH ANESTHESIA N/A 12/10/2016   Procedure: MRI OF BRAIN WITH AND WITHOUT;  Surgeon: Medication Radiologist, MD;  Location: [facility];  Service: Radiology;  Laterality: N/A;  . RADIOLOGY WITH ANESTHESIA N/A 03/02/2017   Procedure: MRI of BRAIN W and W/OUT CONTRAST;  Surgeon: Medication Radiologist, MD;  Location: [facility];  Service: Radiology;  Laterality: N/A;  . RADIOLOGY WITH ANESTHESIA N/A 07/29/2017   Procedure:  RADIOLOGY WITH ANESTHESIA MRI OF BRAIN WITH AND WITHOUT CONTRAST;  Surgeon: Radiologist, Medication, MD;  Location: [facility];  Service: Radiology;  Laterality: N/A;  . RADIOLOGY WITH ANESTHESIA N/A 12/07/2017   Procedure: MRI WITH ANESTHESIA OF BRAIN WITH AND WITHOUT CONTRAST;  Surgeon: Radiologist, Medication, MD;  Location: [facility];  Service: Radiology;  Laterality: N/A;  . RADIOLOGY WITH ANESTHESIA N/A 04/07/2018   Procedure: MRI OF BRAIN WITH AND WITHOUT CONTRAST;  Surgeon: Radiologist, Medication, MD;  Location: [facility];  Service: Radiology;  Laterality: N/A;  . RADIOLOGY WITH ANESTHESIA N/A 08/23/2018   Procedure: MRI WITH ANESTHESIA OF THE BRAIN WITH AND WITHOUT;  Surgeon: Radiologist, Medication, MD;  Location: [facility];  Service: Radiology;  Laterality: N/A;  . RADIOLOGY WITH ANESTHESIA N/A 01/24/2019   Procedure: MRI OF BRAIN WITH AND WITHOUT CONTRAST;  Surgeon: Radiologist, Medication, MD;  Location: [facility];  Service: Radiology;  Laterality: N/A;  . RADIOLOGY WITH ANESTHESIA N/A 07/06/2019   Procedure: MRI WITH ANESTHESIA OF BRAIN WITH AND WITHOUT CONTRAST;  Surgeon: Radiologist, Medication, MD;  Location: [facility];  Service: Radiology;  Laterality: N/A;  . RADIOLOGY WITH ANESTHESIA N/A 01/11/2020   Procedure: MRI WITH ANESTHESIA BRAIN WITH AND WITHOUT;  Surgeon: Radiologist, Medication, MD;  Location: [facility];  Service: Radiology;  Laterality: N/A;  . RADIOLOGY WITH ANESTHESIA N/A 05/21/2020   Procedure: MRI WITH ANESTHESIA OF BRAIN WITH AND WITHOUT CONTRAST;  Surgeon: Radiologist, Medication, MD;  Location: [facility];  Service: Radiology;  Laterality: N/A;  . right power port placement Right     I have reviewed the social history and family history with the patient and they are unchanged from previous note.  ALLERGIES:  is allergic to demerol [meperidine hcl] and erythromycin.  MEDICATIONS:  Current Outpatient Medications  Medication Sig Dispense Refill  . anastrozole (ARIMIDEX) 1 MG tablet TAKE 1 TABLET BY  MOUTH EVERY DAY 90 tablet 1  . carvedilol (COREG) 3.125 MG tablet Take 1 tablet (3.125 mg total) by mouth 2 (two) times daily for 10 days, THEN 2 tablets (6.25 mg total) 2 (two) times daily. 120 tablet 3  . Cholecalciferol (VITAMIN D3) 250 MCG (10000 UT) capsule Take 10,000 Units by mouth daily.     . cyclobenzaprine (FLEXERIL) 10 MG tablet TAKE 1 TABLET BY MOUTH TWICE A DAY 60 tablet 1  . fluticasone (FLONASE) 50 MCG/ACT nasal spray SPRAY 2 SPRAYS INTO EACH NOSTRIL EVERY DAY 16 mL 2  . gabapentin (NEURONTIN) 300 MG capsule Take 1-2 capsules (300-600 mg total) by mouth See admin instructions. Take 300 mg by mouth in the morning and take 600 mg by mouth at bedtime 90 capsule 0  . ibuprofen (ADVIL) 800 MG tablet TAKE 0.5-1 TABLETS (400-800 MG TOTAL) BY MOUTH 2 (TWO) TIMES DAILY AS NEEDED. TAKE WITH FOOD. 90 tablet 0  . lamoTRIgine (LAMICTAL) 100 MG tablet Take 100 mg by mouth every morning.    . Lurasidone HCl (LATUDA) 60 MG TABS Take 60 mg by mouth daily.    [name]  ondansetron (ZOFRAN) 8 MG tablet Take 1 tablet (8 mg total) by mouth every 8 (eight) hours as needed for nausea or vomiting. 90 tablet 1  . pantoprazole (PROTONIX) 40 MG tablet Take 1 tablet (40 mg total) by mouth daily. 90 tablet 3  . rosuvastatin (CRESTOR) 10 MG tablet Take 1 tablet (10 mg total) by mouth daily. 90 tablet 3  . traZODone (DESYREL) 100 MG tablet Take 1 tablet (100 mg total) by mouth at bedtime. (Patient taking differently: Take 300 mg by mouth at bedtime. ) 30 tablet 0  . acetaminophen (TYLENOL) 500 MG tablet Take 1,000 mg by mouth every 6 (six) hours as needed for moderate pain.    [name] azelastine (ASTELIN) 0.1 % nasal spray Place 2 sprays into both nostrils 2 (two) times daily. Use in each nostril as directed 30 mL 12  . cycloSPORINE (RESTASIS) 0.05 % ophthalmic emulsion Place 1 drop into both eyes 2 (two) times daily as needed (dry eyes).     [name] loratadine (CLARITIN) 10 MG tablet Take 1 tablet (10 mg total) by mouth daily. 90  tablet 3  . polyethylene glycol (MIRALAX) 17 g packet Take 17 g by mouth daily as needed for mild constipation.     No current facility-administered medications for this visit.   Facility-Administered Medications Ordered in Other Visits  Medication Dose Route Frequency Provider Last Rate Last Admin  . heparin lock flush 100 unit/mL  500 Units Intracatheter Once PRN Magrinat, Virgie Dad, MD      . sodium chloride flush (NS) 0.9 % injection 10 mL  10 mL Intracatheter PRN Magrinat, Virgie Dad, MD      . trastuzumab-dkst (OGIVRI) 399 mg in sodium chloride 0.9 % 250 mL chemo infusion  6 mg/kg (Treatment Plan Recorded) Intravenous Once Magrinat, Virgie Dad, MD        PHYSICAL EXAMINATION: ECOG PERFORMANCE STATUS: 1 - Symptomatic but completely ambulatory  Vitals:   08/01/20 0935  BP: (!) 106/95  Pulse: 72  Resp: 18  Temp: (!) 97.4 F (36.3 C)  SpO2: 100%   Filed Weights   08/01/20 0935  Weight: 145 lb 4.8 oz (65.9 kg)    GENERAL:alert, no distress and comfortable SKIN: No obvious rash.  Bruise to right lateral abdominal wall EYES:  sclera clear NECK: Without mass LYMPH:  no palpable cervical or supraclavicular lymphadenopathy.  Moderate left upper extremity lymphedema LUNGS: clear  with normal breathing effort HEART: regular rate & rhythm, no lower extremity edema ABDOMEN:abdomen soft, non-tender and normal bowel sounds Musculoskeletal: No focal spinal tenderness NEURO: alert & oriented x 3 with fluent speech, strength, coordination, sensation normal and intact to lower extremities bilaterally.  Normal patellar reflexes.  No decreased vibratory sense per tuning fork exam.  Neuro exam is nonfocal  Breast exam: S/p left mastectomy with implant.  No nodularity or mass of the chest wall, left implant, right breast or either axilla that I could appreciate PAC without erythema  LABORATORY DATA:  I have reviewed the data as listed CBC Latest Ref Rng & Units 08/01/2020 07/10/2020 06/18/2020  WBC  4.0 - 10.5 K/uL 3.1(L) 3.3(L) 2.4(L)  Hemoglobin 12.0 - 15.0 g/dL 11.0(L) 11.6(L) 11.9(L)  Hematocrit 36 - 46 % 32.8(L) 35.3(L) 35.0(L)  Platelets 150 - 400 K/uL 70(L) 76(L) 60(L)     CMP Latest Ref Rng & Units 08/01/2020 07/10/2020 06/18/2020  Glucose 70 - 99 mg/dL 87 87 95  BUN 6 - 20 mg/dL 10 11 10   Creatinine 0.44 - 1.00 mg/dL  0.94 1.04(H) 1.12(H)  Sodium 135 - 145 mmol/L 140 136 136  Potassium 3.5 - 5.1 mmol/L 4.3 4.1 4.1  Chloride 98 - 111 mmol/L 103 101 97(L)  CO2 22 - 32 mmol/L 31 28 27   Calcium 8.9 - 10.3 mg/dL 10.2 10.1 10.3  Total Protein 6.5 - 8.1 g/dL 6.6 6.5 6.8  Total Bilirubin 0.3 - 1.2 mg/dL 0.4 0.4 0.4  Alkaline Phos 38 - 126 U/L 74 69 75  AST 15 - 41 U/L 24 29 35  ALT 0 - 44 U/L 20 22 27       RADIOGRAPHIC STUDIES: I have personally reviewed the radiological images as listed and agreed with the findings in the report. No results found.   ASSESSMENT & PLAN: 59 y.o.  female  1.  Malignant neoplasm of the lower inner quadrant of left breast  -Diagnosed 06/19/2015, invasive ductal carcinoma, grade 2, ER/PR/HER-2 positive, cT2-3 NX  -s/p left mastectomy and axillary lymph node dissection with immediate expander placement 07/18/2015 for mpT4 pN2,stage IIIB invasive ductal carcinoma, grade 3, with negative margins. -S/p adjuvant chemotherapy with docetaxel every 3 weeks x 6 together with trastuzumab and Pertuzumab, completed last docetaxel in 02/11/2016 followed by adjuvant radiation to the chest wall -She developed metastatic disease in the right scapula, left iliac crest, L4 and T-spine in 08/2015. She began first-line trastuzumab and Pertuzumab in 10/26, epratuzumab discontinued in 10/2016 due to uncontrolled diarrhea.  Serial echoes have been stable.  -head CT in 06/2016 showed a cerebellar lesion confirmed by MRI s/p craniotomy on 08/14/2016 path showed a metastatic deposit which was ER/PR negative, HER-2 amplified.  She had a normal CA 27.29.  S/p whole brain radiation  from 08/2016-09/2016, no evidence of recurrent brain metastasis.  Followed by neuro-onc Dr. Mickeal Skinner -She began anastrozole and Xgeva in 08/2016 -PET scan on 01/19/2019 showed a hypermetabolic right paratracheal node consistent with breast cancer recurrence.  Trastuzumab was discontinued and she began second line TDM-1 on 01/2019 which was ultimately discontinued in 10/2019 due to thrombocytopenia.  She resumed every 3 weeks trastuzumab in 10/2019 -Most recent CT/bone scan in 05/2020 shows stable sclerotic T6 and T9 metastases which were negative on bone scan, no other new or progressive disease   2.  Bowel incontinence  3.  Falls, currently ongoing PT for balance.  Awaiting shower chair  4.  Bipolar disorder, stable on gabapentin, lamotrigine, lurasidone, trazodone     Disposition Ms. Westergard appears stable.  She completed another cycle of q. 3 weeks trastuzumab and every 6 weeks Xgeva on 8/11.  She tolerates treatment well overall.  She is able to recover and function well for the most part.  She has had recent falls, she continues physical therapy.  She declined home health services today.  She has developed bowel incontinence at night.  The etiology is unclear.  Denies back pain.  Neurological exam is nonfocal.  Her T6 and T9 spinal lesions were stable and negative on bone scan in July.  We will check C. difficile screen.  I reviewed signs and symptoms to call to report.  If she develops new/worsening back pain, progressive weakness, numbness or further incontinence she knows to seek ED evaluation.  I will CC my note to Dr. Mickeal Skinner  The CBC and CMP were reviewed.  She will proceed with another cycle of trastuzumab today.  She plans to repeat echo and see Dr. Marigene Ehlers later this month.  She will return for follow-up with next cycle in 3 weeks.  The plan  was discussed with Dr. [name].   Orders Placed This Encounter  Procedures  . C difficile quick screen w PCR reflex    Standing Status:    Future    Number of Occurrences:   1    Standing Expiration Date:   08/01/2021   All questions were answered. The patient knows to call the clinic with any problems, questions or concerns. No barriers to learning were detected. Total encounter time was 30 minutes.     Shannon Feeling, NP 08/01/20

## 2020-08-01 ENCOUNTER — Inpatient Hospital Stay: Payer: Medicaid Other | Attending: Medical

## 2020-08-01 ENCOUNTER — Inpatient Hospital Stay: Payer: Medicaid Other | Admitting: Licensed Clinical Social Worker

## 2020-08-01 ENCOUNTER — Encounter: Payer: Self-pay | Admitting: Nurse Practitioner

## 2020-08-01 ENCOUNTER — Inpatient Hospital Stay: Payer: Medicaid Other

## 2020-08-01 ENCOUNTER — Other Ambulatory Visit: Payer: Self-pay

## 2020-08-01 ENCOUNTER — Inpatient Hospital Stay (HOSPITAL_BASED_OUTPATIENT_CLINIC_OR_DEPARTMENT_OTHER): Payer: Medicaid Other | Admitting: Nurse Practitioner

## 2020-08-01 VITALS — BP 106/95 | HR 72 | Temp 97.4°F | Resp 18 | Ht 65.0 in | Wt 145.3 lb

## 2020-08-01 DIAGNOSIS — C770 Secondary and unspecified malignant neoplasm of lymph nodes of head, face and neck: Secondary | ICD-10-CM | POA: Insufficient documentation

## 2020-08-01 DIAGNOSIS — Z5112 Encounter for antineoplastic immunotherapy: Secondary | ICD-10-CM | POA: Diagnosis present

## 2020-08-01 DIAGNOSIS — C50312 Malignant neoplasm of lower-inner quadrant of left female breast: Secondary | ICD-10-CM

## 2020-08-01 DIAGNOSIS — R159 Full incontinence of feces: Secondary | ICD-10-CM

## 2020-08-01 DIAGNOSIS — D696 Thrombocytopenia, unspecified: Secondary | ICD-10-CM

## 2020-08-01 DIAGNOSIS — C7951 Secondary malignant neoplasm of bone: Secondary | ICD-10-CM

## 2020-08-01 DIAGNOSIS — F411 Generalized anxiety disorder: Secondary | ICD-10-CM

## 2020-08-01 DIAGNOSIS — I890 Lymphedema, not elsewhere classified: Secondary | ICD-10-CM | POA: Insufficient documentation

## 2020-08-01 DIAGNOSIS — Z95828 Presence of other vascular implants and grafts: Secondary | ICD-10-CM

## 2020-08-01 DIAGNOSIS — C50119 Malignant neoplasm of central portion of unspecified female breast: Secondary | ICD-10-CM

## 2020-08-01 DIAGNOSIS — C50919 Malignant neoplasm of unspecified site of unspecified female breast: Secondary | ICD-10-CM

## 2020-08-01 LAB — C DIFFICILE QUICK SCREEN W PCR REFLEX
C Diff antigen: NEGATIVE
C Diff interpretation: NOT DETECTED
C Diff toxin: NEGATIVE

## 2020-08-01 LAB — CBC WITH DIFFERENTIAL/PLATELET
Abs Immature Granulocytes: 0.01 10*3/uL (ref 0.00–0.07)
Basophils Absolute: 0 10*3/uL (ref 0.0–0.1)
Basophils Relative: 0 %
Eosinophils Absolute: 0 10*3/uL (ref 0.0–0.5)
Eosinophils Relative: 0 %
HCT: 32.8 % — ABNORMAL LOW (ref 36.0–46.0)
Hemoglobin: 11 g/dL — ABNORMAL LOW (ref 12.0–15.0)
Immature Granulocytes: 0 %
Lymphocytes Relative: 26 %
Lymphs Abs: 0.8 10*3/uL (ref 0.7–4.0)
MCH: 30.2 pg (ref 26.0–34.0)
MCHC: 33.5 g/dL (ref 30.0–36.0)
MCV: 90.1 fL (ref 80.0–100.0)
Monocytes Absolute: 0.3 10*3/uL (ref 0.1–1.0)
Monocytes Relative: 10 %
Neutro Abs: 2 10*3/uL (ref 1.7–7.7)
Neutrophils Relative %: 64 %
Platelets: 70 10*3/uL — ABNORMAL LOW (ref 150–400)
RBC: 3.64 MIL/uL — ABNORMAL LOW (ref 3.87–5.11)
RDW: 12.8 % (ref 11.5–15.5)
WBC: 3.1 10*3/uL — ABNORMAL LOW (ref 4.0–10.5)
nRBC: 0 % (ref 0.0–0.2)

## 2020-08-01 LAB — COMPREHENSIVE METABOLIC PANEL
ALT: 20 U/L (ref 0–44)
AST: 24 U/L (ref 15–41)
Albumin: 3.8 g/dL (ref 3.5–5.0)
Alkaline Phosphatase: 74 U/L (ref 38–126)
Anion gap: 6 (ref 5–15)
BUN: 10 mg/dL (ref 6–20)
CO2: 31 mmol/L (ref 22–32)
Calcium: 10.2 mg/dL (ref 8.9–10.3)
Chloride: 103 mmol/L (ref 98–111)
Creatinine, Ser: 0.94 mg/dL (ref 0.44–1.00)
GFR calc Af Amer: >60 mL/min (ref >60)
GFR calc non Af Amer: >60 mL/min (ref >60)
Glucose, Bld: 87 mg/dL (ref 70–99)
Potassium: 4.3 mmol/L (ref 3.5–5.1)
Sodium: 140 mmol/L (ref 135–145)
Total Bilirubin: 0.4 mg/dL (ref 0.3–1.2)
Total Protein: 6.6 g/dL (ref 6.5–8.1)

## 2020-08-01 MED ORDER — SODIUM CHLORIDE 0.9% FLUSH
10.0000 mL | INTRAVENOUS | Status: DC | PRN
  Administered 2020-08-01: 10 mL
  Filled 2020-08-01: qty 10

## 2020-08-01 MED ORDER — SODIUM CHLORIDE 0.9% FLUSH
10.0000 mL | Freq: Once | INTRAVENOUS | Status: AC
  Administered 2020-08-01: 10 mL
  Filled 2020-08-01: qty 10

## 2020-08-01 MED ORDER — HEPARIN SOD (PORK) LOCK FLUSH 100 UNIT/ML IV SOLN
500.0000 [IU] | Freq: Once | INTRAVENOUS | Status: AC | PRN
  Administered 2020-08-01: 500 [IU]
  Filled 2020-08-01: qty 5

## 2020-08-01 MED ORDER — ACETAMINOPHEN 325 MG PO TABS
650.0000 mg | ORAL_TABLET | Freq: Once | ORAL | Status: AC
  Administered 2020-08-01: 650 mg via ORAL

## 2020-08-01 MED ORDER — TRASTUZUMAB-DKST CHEMO 150 MG IV SOLR
6.0000 mg/kg | Freq: Once | INTRAVENOUS | Status: AC
  Administered 2020-08-01: 399 mg via INTRAVENOUS
  Filled 2020-08-01: qty 19

## 2020-08-01 MED ORDER — SODIUM CHLORIDE 0.9 % IV SOLN
Freq: Once | INTRAVENOUS | Status: AC
  Filled 2020-08-01: qty 250

## 2020-08-01 MED ORDER — ACETAMINOPHEN 325 MG PO TABS
ORAL_TABLET | ORAL | Status: AC
  Filled 2020-08-01: qty 2

## 2020-08-01 NOTE — Patient Instructions (Signed)
[facility] °Discharge Instructions for Patients Receiving Chemotherapy ° °Today you received the following chemotherapy agents Trastuzumab ° °To help prevent nausea and vomiting after your treatment, we encourage you to take your nausea medication as directed. °  °If you develop nausea and vomiting that is not controlled by your nausea medication, call the clinic.  ° °BELOW ARE SYMPTOMS THAT SHOULD BE REPORTED IMMEDIATELY: °· *FEVER GREATER THAN 100.5 F °· *CHILLS WITH OR WITHOUT FEVER °· NAUSEA AND VOMITING THAT IS NOT CONTROLLED WITH YOUR NAUSEA MEDICATION °· *UNUSUAL SHORTNESS OF BREATH °· *UNUSUAL BRUISING OR BLEEDING °· TENDERNESS IN MOUTH AND THROAT WITH OR WITHOUT PRESENCE OF ULCERS °· *URINARY PROBLEMS °· *BOWEL PROBLEMS °· UNUSUAL RASH °Items with * indicate a potential emergency and should be followed up as soon as possible. ° °Feel free to call the clinic should you have any questions or concerns. The clinic phone number is [phone]. ° °Please show the CHEMO ALERT CARD at check-in to the Emergency Department and triage nurse. ° ° °

## 2020-08-01 NOTE — Progress Notes (Signed)
[facility] CSW Progress Note  Clinical Education officer, museum met with patient in infusion to follow-up on resources discussed on 07/29/2020. Completed & submitted application for [employer]ney. Signed up for & provided first installment of [employer]. No other needs at this time.    Edwinna Areola Chery Giusto LCSW

## 2020-08-06 ENCOUNTER — Telehealth: Payer: Self-pay

## 2020-08-06 NOTE — Telephone Encounter (Signed)
Spoke with pt made her aware of CDIFF results(-) no new orders encouraged to call [facility] if sx worsen pt did not verbalize any worsened sx during call again reaffirmed encouraged to call for any questions concerns or changes pt understands

## 2020-08-07 ENCOUNTER — Other Ambulatory Visit: Payer: Self-pay

## 2020-08-07 ENCOUNTER — Other Ambulatory Visit: Payer: Self-pay | Admitting: Family Medicine

## 2020-08-07 ENCOUNTER — Telehealth: Payer: Self-pay | Admitting: *Deleted

## 2020-08-07 ENCOUNTER — Inpatient Hospital Stay (HOSPITAL_BASED_OUTPATIENT_CLINIC_OR_DEPARTMENT_OTHER): Payer: Medicaid Other | Admitting: Hematology and Oncology

## 2020-08-07 DIAGNOSIS — C50312 Malignant neoplasm of lower-inner quadrant of left female breast: Secondary | ICD-10-CM

## 2020-08-07 DIAGNOSIS — Z5112 Encounter for antineoplastic immunotherapy: Secondary | ICD-10-CM | POA: Diagnosis not present

## 2020-08-07 NOTE — Assessment & Plan Note (Signed)
Metastatic breast cancer status post whole brain radiation Current treatment: Trastuzumab  Reason for today's visit: Swelling of the left arm with fluid oozing out of the arm Evaluation: Patient has significant lymphedema which she tells me is chronic for the past several years.  She does not use any of her lymphedema treatments that she has including the pump. No evidence of cellulitis or infection.  Recommendation: [name] her physical therapist was able to meet with her and provided her contact information to be seen at the physical therapy office.  At minimum she was asked to wear at nighttime sleeve to keep the swelling from getting worse.  If she truly wanted the swelling to come down then she will need to use her compression garments and the pump.  No need of antibiotics since there is no evidence of infection.

## 2020-08-07 NOTE — Telephone Encounter (Signed)
This RN spoke with the patient per her VM stating she has developed swelling in arm " and it's leaking like it's sweating "  This RN informed pt post MD review to come in for assessment and further recommendations

## 2020-08-07 NOTE — Progress Notes (Signed)
Patient Care Team: Charlott Rakes, MD as PCP - General (Family Medicine) Kyung Rudd, MD as Consulting Physician (Radiation Oncology) Erline Levine, MD as Consulting Physician (Neurosurgery) Corena Pilgrim, MD as Consulting Physician (Psychiatry) Mickeal Skinner, Acey Lav, MD as Consulting Physician (Psychiatry) Nehemiah Settle, MD as Referring Physician (Plastic Surgery) Irene Limbo, MD as Consulting Physician (Plastic Surgery) Dillingham, Loel Lofty, DO as Attending Physician (Plastic Surgery) Magrinat, Virgie Dad, MD as Consulting Physician (Oncology)  DIAGNOSIS:  Encounter Diagnosis  Name Primary?  . Primary cancer of lower-inner quadrant of left female breast ([place])     SUMMARY OF ONCOLOGIC HISTORY: Oncology History  Primary cancer of lower-inner quadrant of left female breast ([place])  06/01/2016 Initial Diagnosis   Primary cancer of lower-inner quadrant of left female breast ([place])   01/31/2019 - 10/31/2019 Chemotherapy   The patient had ado-trastuzumab emtansine (KADCYLA) 260 mg in sodium chloride 0.9 % 250 mL chemo infusion, 3.4 mg/kg = 280 mg, Intravenous, Once, 12 of 18 cycles Administration: 260 mg (01/31/2019), 260 mg (02/28/2019), 260 mg (05/02/2019), 260 mg (03/21/2019), 260 mg (04/11/2019), 260 mg (05/23/2019), 260 mg (06/20/2019), 260 mg (07/18/2019), 260 mg (08/09/2019), 260 mg (08/30/2019), 260 mg (09/19/2019), 260 mg (10/11/2019)  for chemotherapy treatment.    11/01/2019 -  Chemotherapy   The patient had trastuzumab-dkst (OGIVRI) 399 mg in sodium chloride 0.9 % 250 mL chemo infusion, 6 mg/kg = 399 mg (100 % of original dose 6 mg/kg), Intravenous,  Once, 12 of 18 cycles Dose modification: 6 mg/kg (original dose 6 mg/kg, Cycle 1, Reason: Provider Judgment) Administration: 399 mg (11/01/2019), 399 mg (12/12/2019), 399 mg (03/05/2020), 399 mg (01/09/2020), 399 mg (02/13/2020), 399 mg (03/28/2020), 399 mg (04/16/2020), 399 mg (05/08/2020), 399 mg (05/29/2020), 399 mg (06/18/2020), 399 mg (07/10/2020), 399  mg (08/01/2020)  for chemotherapy treatment.    Malignant neoplasm of female breast ([place])  04/26/2017 Initial Diagnosis   Metastatic breast cancer ([place])   01/31/2019 - 10/31/2019 Chemotherapy   The patient had ado-trastuzumab emtansine (KADCYLA) 260 mg in sodium chloride 0.9 % 250 mL chemo infusion, 3.4 mg/kg = 280 mg, Intravenous, Once, 12 of 18 cycles Administration: 260 mg (01/31/2019), 260 mg (02/28/2019), 260 mg (05/02/2019), 260 mg (03/21/2019), 260 mg (04/11/2019), 260 mg (05/23/2019), 260 mg (06/20/2019), 260 mg (07/18/2019), 260 mg (08/09/2019), 260 mg (08/30/2019), 260 mg (09/19/2019), 260 mg (10/11/2019)  for chemotherapy treatment.    11/01/2019 -  Chemotherapy   The patient had trastuzumab-dkst (OGIVRI) 399 mg in sodium chloride 0.9 % 250 mL chemo infusion, 6 mg/kg = 399 mg (100 % of original dose 6 mg/kg), Intravenous,  Once, 12 of 18 cycles Dose modification: 6 mg/kg (original dose 6 mg/kg, Cycle 1, Reason: Provider Judgment) Administration: 399 mg (11/01/2019), 399 mg (12/12/2019), 399 mg (03/05/2020), 399 mg (01/09/2020), 399 mg (02/13/2020), 399 mg (03/28/2020), 399 mg (04/16/2020), 399 mg (05/08/2020), 399 mg (05/29/2020), 399 mg (06/18/2020), 399 mg (07/10/2020), 399 mg (08/01/2020)  for chemotherapy treatment.    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[place])   11/01/2019 -  Chemotherapy   The patient had trastuzumab-dkst (OGIVRI) 399 mg in sodium chloride 0.9 % 250 mL chemo infusion, 6 mg/kg = 399 mg (100 % of original dose 6 mg/kg), Intravenous,  Once, 12 of 18 cycles Dose modification: 6 mg/kg (original dose 6 mg/kg, Cycle 1, Reason: Provider Judgment) Administration: 399 mg (11/01/2019), 399 mg (12/12/2019), 399 mg (03/05/2020), 399 mg (01/09/2020), 399 mg (  02/13/2020), 399 mg (03/28/2020), 399 mg (04/16/2020), 399 mg (05/08/2020), 399 mg (05/29/2020), 399 mg (06/18/2020), 399 mg (07/10/2020), 399 mg (08/01/2020)  for  chemotherapy treatment.      CHIEF COMPLIANT: Urgent visit because of concern for cellulitis  INTERVAL HISTORY: Savanah Bayles is a 59 year old above-mentioned history of metastatic breast cancer who is currently trastuzumab therapy.  She came in urgently because she thought she was elevating infection arm.  She has noticed increasing fluid oozing out of the arm for the past 24 hours.  She did not have any fevers or chills.  Denies any palpitations or lightheadedness or dizziness.  Her left arm is always had lymphedema and the for the past 24 hours she has noticed increased fluid oozing over the skin.   ALLERGIES:  is allergic to demerol [meperidine hcl] and erythromycin.  MEDICATIONS:  Current Outpatient Medications  Medication Sig Dispense Refill  . acetaminophen (TYLENOL) 500 MG tablet Take 1,000 mg by mouth every 6 (six) hours as needed for moderate pain.    [name] anastrozole (ARIMIDEX) 1 MG tablet TAKE 1 TABLET BY MOUTH EVERY DAY 90 tablet 1  . azelastine (ASTELIN) 0.1 % nasal spray Place 2 sprays into both nostrils 2 (two) times daily. Use in each nostril as directed 30 mL 12  . carvedilol (COREG) 3.125 MG tablet Take 1 tablet (3.125 mg total) by mouth 2 (two) times daily for 10 days, THEN 2 tablets (6.25 mg total) 2 (two) times daily. 120 tablet 3  . Cholecalciferol (VITAMIN D3) 250 MCG (10000 UT) capsule Take 10,000 Units by mouth daily.     . cyclobenzaprine (FLEXERIL) 10 MG tablet TAKE 1 TABLET BY MOUTH TWICE A DAY 60 tablet 1  . cycloSPORINE (RESTASIS) 0.05 % ophthalmic emulsion Place 1 drop into both eyes 2 (two) times daily as needed (dry eyes).     . fluticasone (FLONASE) 50 MCG/ACT nasal spray SPRAY 2 SPRAYS INTO EACH NOSTRIL EVERY DAY 16 mL 2  . gabapentin (NEURONTIN) 300 MG capsule Take 1-2 capsules (300-600 mg total) by mouth See admin instructions. Take 300 mg by mouth in the morning and take 600 mg by mouth at bedtime 90 capsule 0  . ibuprofen (ADVIL) 800 MG tablet TAKE 0.5-1  TABLETS (400-800 MG TOTAL) BY MOUTH 2 (TWO) TIMES DAILY AS NEEDED. TAKE WITH FOOD. 90 tablet 0  . lamoTRIgine (LAMICTAL) 100 MG tablet Take 100 mg by mouth every morning.    . loratadine (CLARITIN) 10 MG tablet Take 1 tablet (10 mg total) by mouth daily. 90 tablet 3  . Lurasidone HCl (LATUDA) 60 MG TABS Take 60 mg by mouth daily.    . ondansetron (ZOFRAN) 8 MG tablet Take 1 tablet (8 mg total) by mouth every 8 (eight) hours as needed for nausea or vomiting. 90 tablet 1  . pantoprazole (PROTONIX) 40 MG tablet Take 1 tablet (40 mg total) by mouth daily. 90 tablet 3  . polyethylene glycol (MIRALAX) 17 g packet Take 17 g by mouth daily as needed for mild constipation.    . rosuvastatin (CRESTOR) 10 MG tablet Take 1 tablet (10 mg total) by mouth daily. 90 tablet 3  . traZODone (DESYREL) 100 MG tablet Take 1 tablet (100 mg total) by mouth at bedtime. (Patient taking differently: Take 300 mg by mouth at bedtime. ) 30 tablet 0   No current facility-administered medications for this visit.    PHYSICAL EXAMINATION: ECOG PERFORMANCE STATUS: 2 - Symptomatic, <50% confined to bed  Vitals:   08/07/20 1143  BP: 103/68  Pulse: 73  Resp: 17  Temp: (!) 96.5 F (35.8 C)  SpO2: 98%   Filed Weights   08/07/20 1143  Weight: 143 lb 14.4 oz (65.3 kg)    BREAST: Left arm swelling which is chronic lymphedema  LABORATORY DATA:  I have reviewed the data as listed CMP Latest Ref Rng & Units 08/01/2020 07/10/2020 06/18/2020  Glucose 70 - 99 mg/dL 87 87 95  BUN 6 - 20 mg/dL 10 11 10   Creatinine 0.44 - 1.00 mg/dL 0.94 1.04(H) 1.12(H)  Sodium 135 - 145 mmol/L 140 136 136  Potassium 3.5 - 5.1 mmol/L 4.3 4.1 4.1  Chloride 98 - 111 mmol/L 103 101 97(L)  CO2 22 - 32 mmol/L 31 28 27   Calcium 8.9 - 10.3 mg/dL 10.2 10.1 10.3  Total Protein 6.5 - 8.1 g/dL 6.6 6.5 6.8  Total Bilirubin 0.3 - 1.2 mg/dL 0.4 0.4 0.4  Alkaline Phos 38 - 126 U/L 74 69 75  AST 15 - 41 U/L 24 29 35  ALT 0 - 44 U/L 20 22 27     Lab  Results  Component Value Date   WBC 3.1 (L) 08/01/2020   HGB 11.0 (L) 08/01/2020   HCT 32.8 (L) 08/01/2020   MCV 90.1 08/01/2020   PLT 70 (L) 08/01/2020   NEUTROABS 2.0 08/01/2020    ASSESSMENT & PLAN:  Primary cancer of lower-inner quadrant of left female breast ([place]) Metastatic breast cancer status post whole brain radiation Current treatment: Trastuzumab  Reason for today's visit: Swelling of the left arm with fluid oozing out of the arm Evaluation: Patient has significant lymphedema which she tells me is chronic for the past several years.  She does not use any of her lymphedema treatments that she has including the pump. No evidence of cellulitis or infection.  Recommendation: Jerrye Beavers her physical therapist was able to meet with her and provided her contact information to be seen at the physical therapy office.  At minimum she was asked to wear at nighttime sleeve to keep the swelling from getting worse.  If she truly wanted the swelling to come down then she will need to use her compression garments and the pump.  No need of antibiotics since there is no evidence of infection.    No orders of the defined types were placed in this encounter.  The patient has a good understanding of the overall plan. she agrees with it. she will call with any problems that may develop before the next visit here. Total time spent: 30 mins including face to face time and time spent for planning, charting and co-ordination of care   Harriette Ohara, MD 08/07/20

## 2020-08-07 NOTE — Telephone Encounter (Signed)
Requested medication (s) are due for refill today:yes  Requested medication (s) are on the active medication list:yes  Last refill:05/22/20  #60  1 refill Future visit scheduled  yes 08/28/20  Notes to clinic:not delegated  Requested Prescriptions  Pending Prescriptions Disp Refills   cyclobenzaprine (FLEXERIL) 10 MG tablet [Pharmacy Med Name: CYCLOBENZAPRINE 10 MG TABLET] 60 tablet 1    Sig: TAKE 1 TABLET BY MOUTH TWICE A DAY      Not Delegated - Analgesics:  Muscle Relaxants Failed - 08/07/2020  6:37 PM      Failed - This refill cannot be delegated      Failed - Valid encounter within last 6 months    Recent Outpatient Visits           6 months ago Acute non-recurrent sinusitis of other sinus   [place], Enobong, MD   8 months ago Other constipation   [place], Enobong, MD   1 year ago Metastatic breast cancer [facility])   [place], Enobong, MD   1 year ago Seasonal allergic rhinitis, unspecified trigger   [place] Elsie Stain, MD   1 year ago Bacterial vaginosis   [place], Enobong, MD       Future Appointments             In 3 weeks Fults, Casimer Bilis [place]

## 2020-08-08 ENCOUNTER — Telehealth: Payer: Self-pay | Admitting: Hematology and Oncology

## 2020-08-08 NOTE — Telephone Encounter (Signed)
No 9/8 los, no changes made to pt schedule

## 2020-08-09 ENCOUNTER — Other Ambulatory Visit: Payer: Self-pay | Admitting: Oncology

## 2020-08-09 ENCOUNTER — Other Ambulatory Visit: Payer: Self-pay | Admitting: *Deleted

## 2020-08-14 ENCOUNTER — Ambulatory Visit: Payer: Medicaid Other | Admitting: Physical Therapy

## 2020-08-19 ENCOUNTER — Ambulatory Visit: Payer: Medicaid Other

## 2020-08-20 ENCOUNTER — Ambulatory Visit (HOSPITAL_COMMUNITY)
Admission: RE | Admit: 2020-08-20 | Discharge: 2020-08-20 | Disposition: A | Discharge status: Home / Self Care | Payer: Medicaid Other | Source: Ambulatory Visit | Attending: Cardiology | Admitting: Cardiology

## 2020-08-20 ENCOUNTER — Other Ambulatory Visit: Payer: Self-pay

## 2020-08-20 ENCOUNTER — Ambulatory Visit (HOSPITAL_BASED_OUTPATIENT_CLINIC_OR_DEPARTMENT_OTHER)
Admission: RE | Admit: 2020-08-20 | Discharge: 2020-08-20 | Disposition: A | Discharge status: Home / Self Care | Payer: Medicaid Other | Source: Ambulatory Visit | Attending: Cardiology | Admitting: Cardiology

## 2020-08-20 ENCOUNTER — Encounter (HOSPITAL_COMMUNITY): Payer: Self-pay | Admitting: Cardiology

## 2020-08-20 VITALS — BP 124/78 | HR 69 | Wt 147.6 lb

## 2020-08-20 DIAGNOSIS — E785 Hyperlipidemia, unspecified: Secondary | ICD-10-CM | POA: Insufficient documentation

## 2020-08-20 DIAGNOSIS — I427 Cardiomyopathy due to drug and external agent: Secondary | ICD-10-CM | POA: Diagnosis not present

## 2020-08-20 DIAGNOSIS — Z87891 Personal history of nicotine dependence: Secondary | ICD-10-CM | POA: Diagnosis not present

## 2020-08-20 DIAGNOSIS — C50312 Malignant neoplasm of lower-inner quadrant of left female breast: Secondary | ICD-10-CM | POA: Insufficient documentation

## 2020-08-20 DIAGNOSIS — Z853 Personal history of malignant neoplasm of breast: Secondary | ICD-10-CM | POA: Insufficient documentation

## 2020-08-20 DIAGNOSIS — E7800 Pure hypercholesterolemia, unspecified: Secondary | ICD-10-CM | POA: Insufficient documentation

## 2020-08-20 DIAGNOSIS — Z9012 Acquired absence of left breast and nipple: Secondary | ICD-10-CM | POA: Insufficient documentation

## 2020-08-20 DIAGNOSIS — C50919 Malignant neoplasm of unspecified site of unspecified female breast: Secondary | ICD-10-CM

## 2020-08-20 DIAGNOSIS — Z79899 Other long term (current) drug therapy: Secondary | ICD-10-CM | POA: Insufficient documentation

## 2020-08-20 DIAGNOSIS — C50011 Malignant neoplasm of nipple and areola, right female breast: Secondary | ICD-10-CM | POA: Diagnosis not present

## 2020-08-20 DIAGNOSIS — T451X5A Adverse effect of antineoplastic and immunosuppressive drugs, initial encounter: Secondary | ICD-10-CM

## 2020-08-20 LAB — ECHOCARDIOGRAM COMPLETE
Area-P 1/2: 4.49 cm2
S' Lateral: 2.8 cm

## 2020-08-20 NOTE — Progress Notes (Signed)
Oncology: Dr. [name]  59 y.o. with history of metastatic breast cancer was referred by Dr. [name] for cardio-oncology evaluation. ER+/PR+/HER2+ left breast cancer diagnosed 7/16.  She had left mastectomy, positive nodes.  She was later found to have bony mets.  6 cycles docetaxol, Herceptin/Perjeta. She had radiation in 2017.  She is now on long-term Herceptin.  She has had serial echoes while on Herceptin.  Echo in 8/21 showed normal EF 60-65%, but GLS had become less negative, -19.1% => -13.5%.   Echo was done today and reviewed, EF 55-60% with GLS -15.4% and normal diastolic function (technically difficult study).   She has no cardiac history.  She has been a smoker, quit this year.  No exertional dyspnea or chest pain.  No orthopnea/PND.  No lightheadedness or palpitations. Generally feels good. She has started Crestor with elevated LDL.   Labs (8/21): LDL 163  PMH: 1. Breast cancer: ER+/PR+/HER2+ left breast cancer diagnosed 7/16.  She had left mastectomy, positive nodes.  She was later found to have bony mets.   - 6 cycles docetaxol, Herceptin/Perjeta.  - Radiation 2017 - Long-term Herceptin.  - Echo (8/21): EF 60-65%, GLS -13.5%, RV normal. - Echo (9/21): EF 48-88%, normal diastolic function, GLS -91.6%, normal RV.  2. Bipolar disorder 3. Smoker: Quit in 8/21.  4. Hyperlipidemia  FH: Father with CABG around age 1.   Social History   Socioeconomic History  . Marital status: Single    Spouse name: Not on file  . Number of children: Not on file  . Years of education: Not on file  . Highest education level: Not on file  Occupational History  . Not on file  Tobacco Use  . Smoking status: Former Smoker    Packs/day: 1.00    Years: 43.00    Pack years: 43.00    Types: Cigarettes  . Smokeless tobacco: Never Used  . Tobacco comment: 3 day since last cigarette  Vaping Use  . Vaping Use: Some days  Substance and Sexual Activity  . Alcohol use: No    Comment: no ETOH  since 08/22/12  . 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 Sexual activity: Never    Birth control/protection: Other-see comments    Comment: ablation  Other Topics Concern  . Not on file  Social History Narrative  . Not on file   Social Determinants of Health   Financial Resource Strain:   . Difficulty of Paying Living Expenses: Not on file  Food Insecurity:   . Worried About Charity fundraiser in the Last Year: Not on file  . Ran Out of Food in the Last Year: Not on file  Transportation Needs:   . Lack of Transportation (Medical): Not on file  . Lack of Transportation (Non-Medical): Not on file  Physical Activity:   . Days of Exercise per Week: Not on file  . Minutes of Exercise per Session: Not on file  Stress:   . Feeling of Stress : Not on file  Social Connections:   . Frequency of Communication with Friends and Family: Not on file  . Frequency of Social Gatherings with Friends and Family: Not on file  . Attends Religious Services: Not on file  . Active Member of Clubs or Organizations: Not on file  . Attends Archivist Meetings: Not on file  . Marital Status: Not on file  Intimate Partner Violence:   . Fear of Current  or Ex-Partner: Not on file  . Emotionally Abused: Not on file  . Physically Abused: Not on file  . Sexually Abused: Not on file   ROS: All systems reviewed and negative except as per HPI.   Current Outpatient Medications  Medication Sig Dispense Refill  . acetaminophen (TYLENOL) 500 MG tablet Take 1,000 mg by mouth every 6 (six) hours as needed for moderate pain.    [name] anastrozole (ARIMIDEX) 1 MG tablet TAKE 1 TABLET BY MOUTH EVERY DAY 90 tablet 1  . azelastine (ASTELIN) 0.1 % nasal spray Place 2 sprays into both nostrils 2 (two) times daily. Use in each nostril as directed 30 mL 12  . carvedilol (COREG) 3.125 MG tablet Take 1 tablet (3.125 mg total) by mouth 2 (two) times daily for 10 days, THEN 2 tablets  (6.25 mg total) 2 (two) times daily. 120 tablet 3  . Cholecalciferol (VITAMIN D3) 250 MCG (10000 UT) capsule Take 10,000 Units by mouth daily.     . cyclobenzaprine (FLEXERIL) 10 MG tablet TAKE 1 TABLET BY MOUTH TWICE A DAY 60 tablet 0  . cycloSPORINE (RESTASIS) 0.05 % ophthalmic emulsion Place 1 drop into both eyes 2 (two) times daily as needed (dry eyes).     . fluticasone (FLONASE) 50 MCG/ACT nasal spray SPRAY 2 SPRAYS INTO EACH NOSTRIL EVERY DAY 16 mL 2  . gabapentin (NEURONTIN) 300 MG capsule Take 1-2 capsules (300-600 mg total) by mouth See admin instructions. Take 300 mg by mouth in the morning and take 600 mg by mouth at bedtime 90 capsule 0  . ibuprofen (ADVIL) 800 MG tablet TAKE 0.5-1 TABLETS (400-800 MG TOTAL) BY MOUTH 2 (TWO) TIMES DAILY AS NEEDED. TAKE WITH FOOD. 90 tablet 0  . lamoTRIgine (LAMICTAL) 100 MG tablet Take 100 mg by mouth every morning.    . loratadine (CLARITIN) 10 MG tablet Take 1 tablet (10 mg total) by mouth daily. 90 tablet 3  . Lurasidone HCl (LATUDA) 60 MG TABS Take 60 mg by mouth daily.    . ondansetron (ZOFRAN) 8 MG tablet Take 1 tablet (8 mg total) by mouth every 8 (eight) hours as needed for nausea or vomiting. 90 tablet 1  . pantoprazole (PROTONIX) 40 MG tablet Take 1 tablet (40 mg total) by mouth daily. 90 tablet 3  . polyethylene glycol (MIRALAX) 17 g packet Take 17 g by mouth daily as needed for mild constipation.    . rosuvastatin (CRESTOR) 10 MG tablet Take 1 tablet (10 mg total) by mouth daily. 90 tablet 3  . traZODone (DESYREL) 100 MG tablet Take 1 tablet (100 mg total) by mouth at bedtime. (Patient taking differently: Take 300 mg by mouth at bedtime. ) 30 tablet 0   No current facility-administered medications for this encounter.   BP 124/78   Pulse 69   Wt 67 kg (147 lb 9.6 oz)   LMP  (LMP Unknown) Comment: ablation  SpO2 98%   BMI 24.56 kg/m  General: NAD Neck: No JVD, no thyromegaly or thyroid nodule.  Lungs: Clear to auscultation  bilaterally with normal respiratory effort. CV: Nondisplaced PMI.  Heart regular S1/S2, no S3/S4, no murmur.  No peripheral edema.  No carotid bruit.  Normal pedal pulses.  Abdomen: Soft, nontender, no hepatosplenomegaly, no distention.  Skin: Intact without lesions or rashes.  Neurologic: Alert and oriented x 3.  Psych: Normal affect. Extremities: No clubbing or cyanosis.  HEENT: Normal.   Assessment/Plan: 1. Breast cancer: Metastatic, now on long-term Herceptin.  She has  been getting serial echoes since 2016, they have all showed normal EF.  However, echo in 8/21 showed less negative GLS, with change from -19.1% => -13.5%.  This fall in strain was concerning as it could augur future fall in EF.  However, given prior echoes that have all been normal, I am concerned that the change may have just been related to technique (difficult study).  Echo today showed EF 55-60% with GLS mildly more negative at -15.4%.  Technically difficult study, hard to track endocardium for strain images.  I suspect that her LV function is stable.  - She will continue Coreg 6.25 mg bid for now.  I will repeat echo at 3 months and if unchanged, I think she can stop Coreg.  - She will continue Herceptin.  2. Hyperlipidemia: Very high LDL in 8/21.  She is now on Crestor.  - Check lipids/LFTs in 1 month.   Loralie Champagne 08/20/2020

## 2020-08-20 NOTE — Patient Instructions (Signed)
Your physician has requested that you have an echocardiogram. Echocardiography is a painless test that uses sound waves to create images of your heart. It provides your doctor with information about the size and shape of your heart and how well your heart's chambers and valves are working. This procedure takes approximately one hour. There are no restrictions for this procedure.   Your physician recommends that you schedule a follow-up appointment in: 1 month for labs and 3 months for an echo and visit with Dr Aundra Dubin  Please call office at [phone] option 2 if you have any questions or concerns.   At the [facility], you and your health needs are our priority. As part of our continuing mission to provide you with exceptional heart care, we have created designated Provider Care Teams. These Care Teams include your primary Cardiologist (physician) and Advanced Practice Providers (APPs- Physician Assistants and Nurse Practitioners) who all work together to provide you with the care you need, when you need it.   You may see any of the following providers on your designated Care Team at your next follow up: [name] Dr Glori Bickers . Dr Loralie Champagne . Darrick Grinder, NP . Lyda Jester, PA . Audry Riles, PharmD   Please be sure to bring in all your medications bottles to every appointment.

## 2020-08-20 NOTE — Progress Notes (Signed)
  Echocardiogram 2D Echocardiogram has been performed.  Jannett Celestine 08/20/2020, 11:06 AM

## 2020-08-21 ENCOUNTER — Encounter: Payer: Self-pay | Admitting: Licensed Clinical Social Worker

## 2020-08-21 ENCOUNTER — Inpatient Hospital Stay: Payer: Medicaid Other

## 2020-08-21 ENCOUNTER — Other Ambulatory Visit: Payer: Self-pay

## 2020-08-21 ENCOUNTER — Other Ambulatory Visit: Payer: Self-pay | Admitting: Oncology

## 2020-08-21 VITALS — BP 104/82 | HR 69 | Temp 98.2°F | Resp 18

## 2020-08-21 DIAGNOSIS — Z5112 Encounter for antineoplastic immunotherapy: Secondary | ICD-10-CM | POA: Diagnosis not present

## 2020-08-21 DIAGNOSIS — C770 Secondary and unspecified malignant neoplasm of lymph nodes of head, face and neck: Secondary | ICD-10-CM

## 2020-08-21 DIAGNOSIS — C50119 Malignant neoplasm of central portion of unspecified female breast: Secondary | ICD-10-CM

## 2020-08-21 DIAGNOSIS — C7931 Secondary malignant neoplasm of brain: Secondary | ICD-10-CM

## 2020-08-21 DIAGNOSIS — Z95828 Presence of other vascular implants and grafts: Secondary | ICD-10-CM

## 2020-08-21 DIAGNOSIS — D696 Thrombocytopenia, unspecified: Secondary | ICD-10-CM

## 2020-08-21 DIAGNOSIS — C50312 Malignant neoplasm of lower-inner quadrant of left female breast: Secondary | ICD-10-CM

## 2020-08-21 DIAGNOSIS — F411 Generalized anxiety disorder: Secondary | ICD-10-CM

## 2020-08-21 DIAGNOSIS — C7951 Secondary malignant neoplasm of bone: Secondary | ICD-10-CM

## 2020-08-21 LAB — CBC WITH DIFFERENTIAL/PLATELET
Abs Immature Granulocytes: 0.02 10*3/uL (ref 0.00–0.07)
Basophils Absolute: 0 10*3/uL (ref 0.0–0.1)
Basophils Relative: 1 %
Eosinophils Absolute: 0 10*3/uL (ref 0.0–0.5)
Eosinophils Relative: 0 %
HCT: 30.4 % — ABNORMAL LOW (ref 36.0–46.0)
Hemoglobin: 10.4 g/dL — ABNORMAL LOW (ref 12.0–15.0)
Immature Granulocytes: 1 %
Lymphocytes Relative: 27 %
Lymphs Abs: 0.7 10*3/uL (ref 0.7–4.0)
MCH: 31.2 pg (ref 26.0–34.0)
MCHC: 34.2 g/dL (ref 30.0–36.0)
MCV: 91.3 fL (ref 80.0–100.0)
Monocytes Absolute: 0.3 10*3/uL (ref 0.1–1.0)
Monocytes Relative: 9 %
Neutro Abs: 1.7 10*3/uL (ref 1.7–7.7)
Neutrophils Relative %: 62 %
Platelets: 56 10*3/uL — ABNORMAL LOW (ref 150–400)
RBC: 3.33 MIL/uL — ABNORMAL LOW (ref 3.87–5.11)
RDW: 13.3 % (ref 11.5–15.5)
WBC: 2.7 10*3/uL — ABNORMAL LOW (ref 4.0–10.5)
nRBC: 0 % (ref 0.0–0.2)

## 2020-08-21 LAB — COMPREHENSIVE METABOLIC PANEL
ALT: 22 U/L (ref 0–44)
AST: 25 U/L (ref 15–41)
Albumin: 3.6 g/dL (ref 3.5–5.0)
Alkaline Phosphatase: 58 U/L (ref 38–126)
Anion gap: 6 (ref 5–15)
BUN: 12 mg/dL (ref 6–20)
CO2: 32 mmol/L (ref 22–32)
Calcium: 9.2 mg/dL (ref 8.9–10.3)
Chloride: 102 mmol/L (ref 98–111)
Creatinine, Ser: 1.03 mg/dL — ABNORMAL HIGH (ref 0.44–1.00)
GFR calc Af Amer: >60 mL/min (ref >60)
GFR calc non Af Amer: 59 mL/min — ABNORMAL LOW (ref >60)
Glucose, Bld: 90 mg/dL (ref 70–99)
Potassium: 4 mmol/L (ref 3.5–5.1)
Sodium: 140 mmol/L (ref 135–145)
Total Bilirubin: 0.4 mg/dL (ref 0.3–1.2)
Total Protein: 6.3 g/dL — ABNORMAL LOW (ref 6.5–8.1)

## 2020-08-21 IMAGING — US ULTRASOUND LEFT BREAST LIMITED
1 series · 3 of 3 positions shown · non-contrast
Comparison: Previous exam(s).

CLINICAL DATA: 57-year-old female status post left mastectomy in
3560. Patient complains of swelling and tightness along the superior
aspect of the mastectomy bed for several months.

EXAM:
DIGITAL DIAGNOSTIC RIGHT MAMMOGRAM WITH CAD AND TOMO
ULTRASOUND LEFT MASTECTOMY BED

[Series 1: ultrasound left breast limited · 0.05mm/px · 3 of 3 slices shown]
[im 1/3]
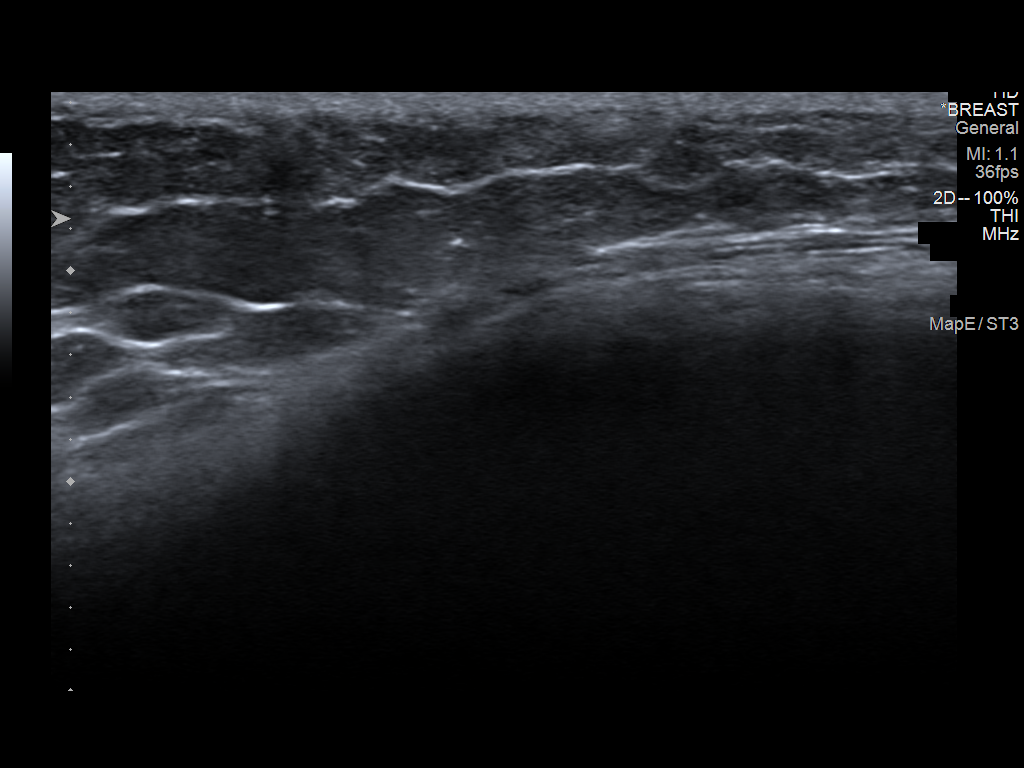
[im 2/3]
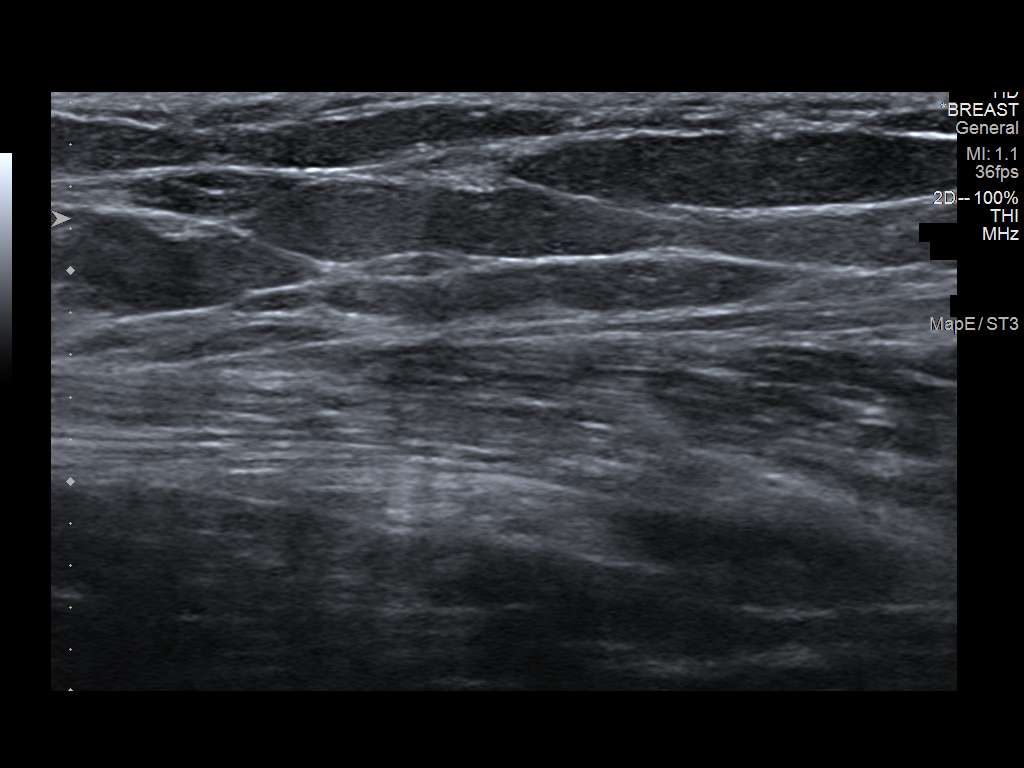
[im 3/3]
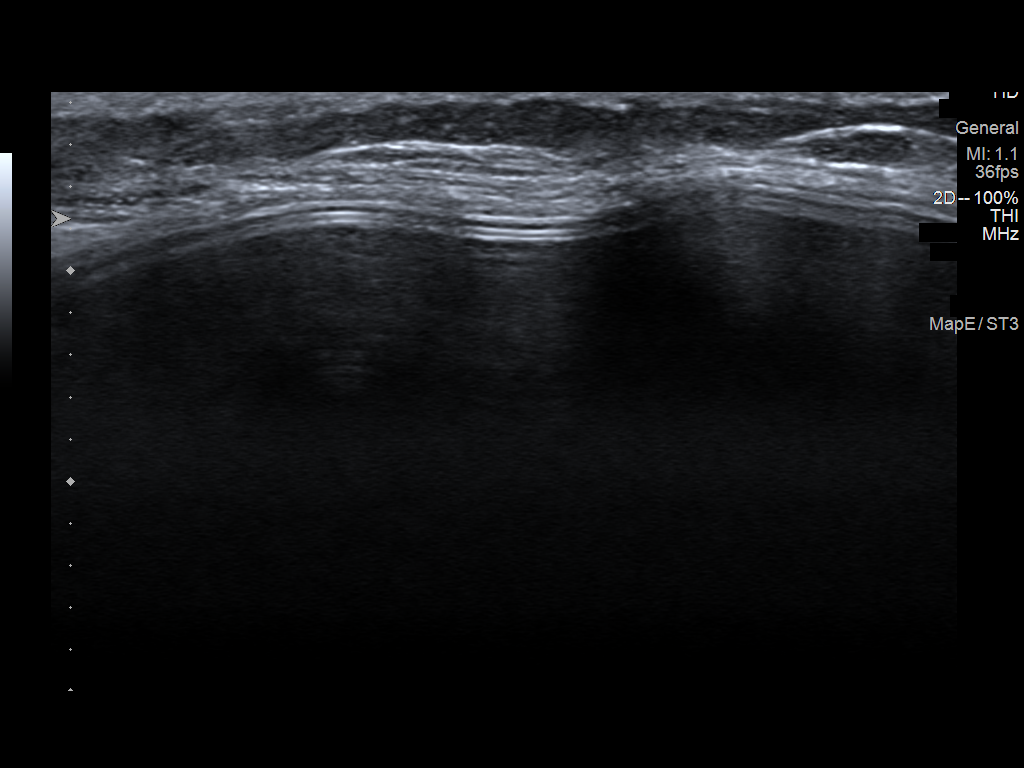

[3 of 3 positions shown; findings below may reference images not displayed]

ACR Breast Density Category c: The breast tissue is heterogeneously
dense, which may obscure small masses.
FINDINGS: No suspicious mammographic findings are identified on the right. The
parenchymal pattern is stable.

Mammographic images were processed with CAD.

Targeted ultrasound is performed, showing normal soft tissue without
focal or suspicious sonographic abnormality. Evaluation of the
superior left mastectomy bed was performed. Underlying implant is
identified.
IMPRESSION: 1. No suspicious mammographic findings on the right.
2. No suspicious sonographic findings corresponding with the
patient's left mastectomy bed symptoms. Recommendation is for
clinical and symptomatic follow-up.

RECOMMENDATION:
1. Clinical follow-up recommended for the symptomatic area of
concern in the left mastectomy bed. Any further workup should be
based on clinical grounds.
2. Screening right breast mammogram in 1 year.

I have discussed the findings and recommendations with the patient.
Results were also provided in writing at the conclusion of the
visit. If applicable, a reminder letter will be sent to the patient
regarding the next appointment.

BI-RADS CATEGORY  2: Benign.

## 2020-08-21 MED ORDER — HEPARIN SOD (PORK) LOCK FLUSH 100 UNIT/ML IV SOLN
500.0000 [IU] | Freq: Once | INTRAVENOUS | Status: AC | PRN
  Administered 2020-08-21: 500 [IU]
  Filled 2020-08-21: qty 5

## 2020-08-21 MED ORDER — SODIUM CHLORIDE 0.9% FLUSH
10.0000 mL | Freq: Once | INTRAVENOUS | Status: AC
  Administered 2020-08-21: 10 mL
  Filled 2020-08-21: qty 10

## 2020-08-21 MED ORDER — ACETAMINOPHEN 325 MG PO TABS
650.0000 mg | ORAL_TABLET | Freq: Once | ORAL | Status: AC
  Administered 2020-08-21: 650 mg via ORAL

## 2020-08-21 MED ORDER — SODIUM CHLORIDE 0.9% FLUSH
10.0000 mL | INTRAVENOUS | Status: DC | PRN
  Administered 2020-08-21: 10 mL
  Filled 2020-08-21: qty 10

## 2020-08-21 MED ORDER — DENOSUMAB 120 MG/1.7ML ~~LOC~~ SOLN
SUBCUTANEOUS | Status: AC
  Filled 2020-08-21: qty 1.7

## 2020-08-21 MED ORDER — ACETAMINOPHEN 325 MG PO TABS
ORAL_TABLET | ORAL | Status: AC
  Filled 2020-08-21: qty 2

## 2020-08-21 MED ORDER — DENOSUMAB 120 MG/1.7ML ~~LOC~~ SOLN
120.0000 mg | Freq: Once | SUBCUTANEOUS | Status: AC
  Administered 2020-08-21: 120 mg via SUBCUTANEOUS

## 2020-08-21 MED ORDER — SODIUM CHLORIDE 0.9 % IV SOLN
Freq: Once | INTRAVENOUS | Status: AC
  Filled 2020-08-21: qty 250

## 2020-08-21 MED ORDER — TRASTUZUMAB-DKST CHEMO 150 MG IV SOLR
6.0000 mg/kg | Freq: Once | INTRAVENOUS | Status: AC
  Administered 2020-08-21: 399 mg via INTRAVENOUS
  Filled 2020-08-21: qty 19

## 2020-08-21 NOTE — Progress Notes (Signed)
Patient very wobbly today.  Needs assistance with walking.  Gardiner Rhyme, RN

## 2020-08-21 NOTE — Progress Notes (Signed)
[facility] CS[facility] Progress Note  Holiday representative met with patient to provide ongoing support. Provided next [employer]. Patient reports having a more difficult day today with weak legs again. Still does not feel it impacts her completing tasks if she can rest in between. Is open to in-home PT or other therapies. Agreed to referral to [facility]- placed through [facility]. Also would like to see if MD can do referral for home health. Message sent to Dr. [name].    Edwinna Areola Caidyn Blossom , LCSW

## 2020-08-21 NOTE — Progress Notes (Signed)
Dr. [name] made aware of today's platelet count of 56. Ok to proceed with treatment per Dr. [name]

## 2020-08-21 NOTE — Patient Instructions (Signed)
[facility] Discharge Instructions for Patients Receiving Chemotherapy  Today you received the following chemotherapy agents: Ogivri   To help prevent nausea and vomiting after your treatment, we encourage you to take your nausea medication as directed.    If you develop nausea and vomiting that is not controlled by your nausea medication, call the clinic.   BELOW ARE SYMPTOMS THAT SHOULD BE REPORTED IMMEDIATELY:  *FEVER GREATER THAN 100.5 F  *CHILLS WITH OR WITHOUT FEVER  NAUSEA AND VOMITING THAT IS NOT CONTROLLED WITH YOUR NAUSEA MEDICATION  *UNUSUAL SHORTNESS OF BREATH  *UNUSUAL BRUISING OR BLEEDING  TENDERNESS IN MOUTH AND THROAT WITH OR WITHOUT PRESENCE OF ULCERS  *URINARY PROBLEMS  *BOWEL PROBLEMS  UNUSUAL RASH Items with * indicate a potential emergency and should be followed up as soon as possible.  Feel free to call the clinic should you have any questions or concerns. The clinic phone number is [phone].  Please show the CHEMO ALERT CARD at check-in to the Emergency Department and triage nurse.   

## 2020-08-21 NOTE — Progress Notes (Signed)
[facility] unable to process referral due to insurance and insignificant staffing.   RN faxed new referral to [facility] [phone].

## 2020-08-21 NOTE — Patient Instructions (Signed)

## 2020-08-21 NOTE — Progress Notes (Signed)
Home health referral faxed to [facility] for physical therapy evaluation due to weakness, and unsteady gait per MD recommendations.

## 2020-08-23 ENCOUNTER — Ambulatory Visit: Payer: Medicaid Other | Admitting: Physical Therapy

## 2020-08-26 ENCOUNTER — Ambulatory Visit: Payer: Medicaid Other

## 2020-08-28 ENCOUNTER — Ambulatory Visit: Payer: Medicaid Other | Admitting: Physician Assistant

## 2020-08-29 ENCOUNTER — Ambulatory Visit: Payer: Medicaid Other

## 2020-09-03 ENCOUNTER — Ambulatory Visit: Payer: Medicaid Other | Admitting: Physical Therapy

## 2020-09-06 ENCOUNTER — Ambulatory Visit: Payer: Medicaid Other

## 2020-09-10 ENCOUNTER — Ambulatory Visit: Payer: Medicaid Other | Admitting: Physical Therapy

## 2020-09-10 ENCOUNTER — Other Ambulatory Visit: Payer: Self-pay | Admitting: Family Medicine

## 2020-09-10 NOTE — Telephone Encounter (Signed)
Requested medications are due for refill today?  Yes  - This medication refill cannot be delegated.    Requested medications are on active medication list?  Yes  Last Refill:  08/09/2020  # 60 with no refills   Future visit scheduled? No  Notes to Clinic:  This medication refill cannot be delegated.

## 2020-09-11 ENCOUNTER — Encounter: Payer: Self-pay | Admitting: Licensed Clinical Social Worker

## 2020-09-11 ENCOUNTER — Other Ambulatory Visit: Payer: Self-pay | Admitting: Adult Health

## 2020-09-11 ENCOUNTER — Inpatient Hospital Stay: Payer: Medicaid Other | Attending: Medical

## 2020-09-11 ENCOUNTER — Inpatient Hospital Stay: Payer: Medicaid Other

## 2020-09-11 ENCOUNTER — Ambulatory Visit (HOSPITAL_COMMUNITY)
Admission: RE | Admit: 2020-09-11 | Discharge: 2020-09-11 | Disposition: A | Discharge status: Home / Self Care | Payer: Medicaid Other | Source: Ambulatory Visit | Attending: Adult Health | Admitting: Adult Health

## 2020-09-11 ENCOUNTER — Inpatient Hospital Stay (HOSPITAL_BASED_OUTPATIENT_CLINIC_OR_DEPARTMENT_OTHER): Payer: Medicaid Other | Admitting: Adult Health

## 2020-09-11 ENCOUNTER — Other Ambulatory Visit: Payer: Self-pay

## 2020-09-11 ENCOUNTER — Encounter: Payer: Self-pay | Admitting: Adult Health

## 2020-09-11 VITALS — BP 98/63 | HR 87 | Temp 97.5°F | Resp 18 | Ht 65.0 in | Wt 147.1 lb

## 2020-09-11 DIAGNOSIS — C50312 Malignant neoplasm of lower-inner quadrant of left female breast: Secondary | ICD-10-CM

## 2020-09-11 DIAGNOSIS — F411 Generalized anxiety disorder: Secondary | ICD-10-CM

## 2020-09-11 DIAGNOSIS — Z5112 Encounter for antineoplastic immunotherapy: Secondary | ICD-10-CM | POA: Diagnosis present

## 2020-09-11 DIAGNOSIS — C7951 Secondary malignant neoplasm of bone: Secondary | ICD-10-CM

## 2020-09-11 DIAGNOSIS — C50011 Malignant neoplasm of nipple and areola, right female breast: Secondary | ICD-10-CM | POA: Insufficient documentation

## 2020-09-11 DIAGNOSIS — C50012 Malignant neoplasm of nipple and areola, left female breast: Secondary | ICD-10-CM

## 2020-09-11 DIAGNOSIS — C7931 Secondary malignant neoplasm of brain: Secondary | ICD-10-CM

## 2020-09-11 DIAGNOSIS — D696 Thrombocytopenia, unspecified: Secondary | ICD-10-CM

## 2020-09-11 DIAGNOSIS — Z95828 Presence of other vascular implants and grafts: Secondary | ICD-10-CM

## 2020-09-11 DIAGNOSIS — C770 Secondary and unspecified malignant neoplasm of lymph nodes of head, face and neck: Secondary | ICD-10-CM

## 2020-09-11 DIAGNOSIS — C50119 Malignant neoplasm of central portion of unspecified female breast: Secondary | ICD-10-CM

## 2020-09-11 LAB — COMPREHENSIVE METABOLIC PANEL
ALT: 18 U/L (ref 0–44)
AST: 22 U/L (ref 15–41)
Albumin: 3.7 g/dL (ref 3.5–5.0)
Alkaline Phosphatase: 60 U/L (ref 38–126)
Anion gap: 4 — ABNORMAL LOW (ref 5–15)
BUN: 16 mg/dL (ref 6–20)
CO2: 34 mmol/L — ABNORMAL HIGH (ref 22–32)
Calcium: 9.9 mg/dL (ref 8.9–10.3)
Chloride: 101 mmol/L (ref 98–111)
Creatinine, Ser: 1.16 mg/dL — ABNORMAL HIGH (ref 0.44–1.00)
GFR, Estimated: 51 mL/min — ABNORMAL LOW (ref >60)
Glucose, Bld: 88 mg/dL (ref 70–99)
Potassium: 4.4 mmol/L (ref 3.5–5.1)
Sodium: 139 mmol/L (ref 135–145)
Total Bilirubin: 0.3 mg/dL (ref 0.3–1.2)
Total Protein: 6.5 g/dL (ref 6.5–8.1)

## 2020-09-11 LAB — CBC WITH DIFFERENTIAL/PLATELET
Abs Immature Granulocytes: 0.02 10*3/uL (ref 0.00–0.07)
Basophils Absolute: 0 10*3/uL (ref 0.0–0.1)
Basophils Relative: 0 %
Eosinophils Absolute: 0 10*3/uL (ref 0.0–0.5)
Eosinophils Relative: 0 %
HCT: 32.2 % — ABNORMAL LOW (ref 36.0–46.0)
Hemoglobin: 10.8 g/dL — ABNORMAL LOW (ref 12.0–15.0)
Immature Granulocytes: 1 %
Lymphocytes Relative: 23 %
Lymphs Abs: 0.8 10*3/uL (ref 0.7–4.0)
MCH: 30.6 pg (ref 26.0–34.0)
MCHC: 33.5 g/dL (ref 30.0–36.0)
MCV: 91.2 fL (ref 80.0–100.0)
Monocytes Absolute: 0.3 10*3/uL (ref 0.1–1.0)
Monocytes Relative: 9 %
Neutro Abs: 2.4 10*3/uL (ref 1.7–7.7)
Neutrophils Relative %: 67 %
Platelets: 78 10*3/uL — ABNORMAL LOW (ref 150–400)
RBC: 3.53 MIL/uL — ABNORMAL LOW (ref 3.87–5.11)
RDW: 13.7 % (ref 11.5–15.5)
WBC: 3.6 10*3/uL — ABNORMAL LOW (ref 4.0–10.5)
nRBC: 0 % (ref 0.0–0.2)

## 2020-09-11 MED ORDER — TRASTUZUMAB-DKST CHEMO 150 MG IV SOLR
6.0000 mg/kg | Freq: Once | INTRAVENOUS | Status: AC
  Administered 2020-09-11: 399 mg via INTRAVENOUS
  Filled 2020-09-11: qty 19

## 2020-09-11 MED ORDER — SODIUM CHLORIDE 0.9% FLUSH
10.0000 mL | Freq: Once | INTRAVENOUS | Status: AC
  Administered 2020-09-11: 10 mL
  Filled 2020-09-11: qty 10

## 2020-09-11 MED ORDER — ACETAMINOPHEN 325 MG PO TABS
650.0000 mg | ORAL_TABLET | Freq: Once | ORAL | Status: AC
  Administered 2020-09-11: 650 mg via ORAL

## 2020-09-11 MED ORDER — SODIUM CHLORIDE 0.9% FLUSH
10.0000 mL | INTRAVENOUS | Status: DC | PRN
  Administered 2020-09-11: 10 mL
  Filled 2020-09-11: qty 10

## 2020-09-11 MED ORDER — SODIUM CHLORIDE 0.9 % IV SOLN
Freq: Once | INTRAVENOUS | Status: AC
  Filled 2020-09-11: qty 250

## 2020-09-11 MED ORDER — ACETAMINOPHEN 325 MG PO TABS
ORAL_TABLET | ORAL | Status: AC
  Filled 2020-09-11: qty 2

## 2020-09-11 MED ORDER — HEPARIN SOD (PORK) LOCK FLUSH 100 UNIT/ML IV SOLN
500.0000 [IU] | Freq: Once | INTRAVENOUS | Status: AC | PRN
  Administered 2020-09-11: 500 [IU]
  Filled 2020-09-11: qty 5

## 2020-09-11 NOTE — Progress Notes (Signed)
Per Wilber Bihari, NP pt will not receive Xgeva today d/t recent tooth extraction. Shearon Balo, RN made aware.

## 2020-09-11 NOTE — Patient Instructions (Signed)

## 2020-09-11 NOTE — Progress Notes (Signed)
[facility]  [phone] Fax:([phone]) [phone]     ID: [name] DOB: 1961-04-18  MR#: [id number]  KPV#:[id number]  Patient Care Team: Charlott Rakes, MD as PCP - General (Family Medicine) Larey Dresser, MD as PCP - Advanced Heart Failure (Cardiology) Kyung Rudd, MD as Consulting Physician (Radiation Oncology) Erline Levine, MD as Consulting Physician (Neurosurgery) Corena Pilgrim, MD as Consulting Physician (Psychiatry) Mickeal Skinner Acey Lav, MD as Consulting Physician (Psychiatry) Nehemiah Settle, MD as Referring Physician (Plastic Surgery) Irene Limbo, MD as Consulting Physician (Plastic Surgery) Dillingham, Loel Lofty, DO as Attending Physician (Plastic Surgery) Magrinat, Virgie Dad, MD as Consulting Physician (Oncology)   CHIEF COMPLAINT: Metastatic triple positive breast cancer  CURRENT TREATMENT: trastuzumab [every 21 days], denosumab/Xgeva (Q6W), anastrozole   INTERVAL HISTORY: [name] returns today for follow up and treatment of her metastatic triple positive breast cancer.  She continues on trastuzumab, given every 21 days.  We switched back to this for maintenance after she took the TDM 1 for 8 months. Her most recent echocardiogram on 08/20/2020 showed an EF of 55-60%, this is a slight drop and her heart strain was worse, Dr. Aundra Dubin is following her for this, and believes the decline is likely due to the exam itself and technique, rather than a true reading.  She will see him again in 10/2020 for repeat echo and f/u.   She is tolerating denosumab/Xgeva every 6 weeks and has no issues with taking this.   She did have a dental extraction last week.  She notes no difficulty with this and states that the tooth is almost healed.    She continues on anastrozole with good tolerance only needing some coconut oil for intercourse for vaginal dryness.    Her most recent restaging brain MRI that was negative for metastatic disease on 05/22/2020, and she  will undergo repeat in 10/2020.  She underwent CT chest and bone scan in 05/2020 which showed no sign of cancer progression.     REVIEW OF SYSTEMS: [name] is doing moderately well today.  She says that she is very constipated.  She is unable to afford Miralax.  She has received assistance from social work, and there are limited resources for continued support.  She is eating fruits/vegetables.  Her water intake has decreased somewhat.    [name] reports right sciatic pain.  She says that she has been taking increased amounts of Ibuprofen for the pain.  She works outside [place].  She cannot recall a precipitating event.  She denies any focal weakness, saddle anesthesia, bowel/bladder incontinence.  She denies any numbness or tingling in her leg.  A detailed ROS was otherwise non contributory today.    BREAST CANCER HISTORY: From the original intake note:  [name] was aware of a "lemon sized lump in" her left axilla for about a year before bringing it to medical attention. By then she had developed left breast and left axillary swelling (June 2016). She presented to the local emergency room and had a chest CT scan 06/06/2015 which showed a nodule in the left breast measuring 0.9 cm and questionable left axillary adenopathy. She then proceeded to bilateral diagnostic mammography and left breast ultrasonography 06/19/2015. There were no prior films for comparison (last mammography 12 years prior).. The breast density was category C. Mammography showed in the left breast upper inner quadrant a 7 cm area including a small mass and significant pleomorphic calcifications. Ultrasonography defined the mass as measuring 1.2 cm. The left axilla appeared unremarkable.  There was significant skin edema.  Biopsy of the left breast mass 06/19/2015 showed ([id number]) an invasive ductal carcinoma, grade 2, estrogen receptor 83% positive, progesterone receptor 26% positive, and HER-2 amplified by immunohistochemstry with  a 3+ reading. The patient had biopsies of a separate area in the left breast August of the same year and this showed atypical ductal hyperplasia. (SP [id number]).  Accordingly after appropriate discussion on 08/21/2015 the patient proceeded to left mastectomy with left axillary sentinel lymph node sampling, which, since the lymph nodes were positive, extended to the procedure to left axillary lymph node dissection. The pathology ([id number]) showed an invasive ductal carcinoma, grade 3, measuring in excess of 9 cm. There were also skin satellites, not contiguous with the invasive carcinoma. Margins were clear and ample. There was evidence of lymphovascular invasion. A total of 15 lymph nodes were removed, including 5 sentinel lymph nodes, all of which were positive, so that the final total was 14 out of 15 lymph nodes involved by tumor. There was evidence of extranodal extension. The final pathology was pT4b pN2a, stage IIIB  CA-27-29 and CEA 09/19/2015 were non-informative October 2016.  Unfortunately CT scans of the chest abdomen and pelvis 09/16/2015 showed bony metastases to the ri/ght scapula, left iliac crest, and also L4 and T-spine. There were questionable liver cysts which on repeat CT scan 03/02/2016 appear to be a little bit more well-defined, possibly a little larger. There were also some possible right upper lobe lung lesions.  Adjuvant treatment consisted of docetaxel, trastuzumab and pertuzumab, with the final (6th) docetaxel dose given 02/11/2016. She continues on trastuzumab and pertuzumab, with the 11th cycle given 05/05/2016. Echocardiogram 02/26/2016 showed an ejection fraction of 55%. She receives denosumab/Xgeva every 4 weeks.. She also receives radiation, started 06/09/201, to be completed 06/26/2016.  Her subsequent history is as detailed below   PAST MEDICAL HISTORY: Past Medical History:  Diagnosis Date  . Alcohol abuse    none since 2013  . Anemia    during chemo  .  Anxiety    At age 57  . Arthritis Dx 2010  . Bipolar disorder ([place])   . Bronchitis   . Cancer ([facility])    breast mets to brain  . Chronic pain    resolved per patient 12/25/19  . Complication of anesthesia   . Depression   . Family history of adverse reaction to anesthesia    MOther had PONV  . GERD (gastroesophageal reflux disease)   . Headache    hx  migraines  . Lymphedema of left arm   . Opiate dependence ([place])   . PONV (postoperative nausea and vomiting)   . Port-A-Cath in place   . PTSD (post-traumatic stress disorder)   . S/P endometrial ablation    in md's office    PAST SURGICAL HISTORY: Past Surgical History:  Procedure Laterality Date  . APPLICATION OF CRANIAL NAVIGATION N/A 08/14/2016   Procedure: APPLICATION OF CRANIAL NAVIGATION;  Surgeon: Erline Levine, MD;  Location: [facility];  Service: Neurosurgery;  Laterality: N/A;  . BREAST RECONSTRUCTION Left    with silicone implant  . COLONOSCOPY W/ POLYPECTOMY    . CRANIOTOMY N/A 08/14/2016   Procedure: CRANIOTOMY TUMOR EXCISION WITH Lucky Rathke;  Surgeon: Erline Levine, MD;  Location: [facility];  Service: Neurosurgery;  Laterality: N/A;  . FIBULA FRACTURE SURGERY Left   . MASTECTOMY Left   . RADIOLOGY WITH ANESTHESIA N/A 07/23/2016   Procedure: MRI OF BRAIN WITH AND WITHOUT;  Surgeon:  Medication Radiologist, MD;  Location: [facility];  Service: Radiology;  Laterality: N/A;  . RADIOLOGY WITH ANESTHESIA N/A 09/08/2016   Procedure: MRI OF BRAIN WITH AND WITHOUT CONTRAST;  Surgeon: Medication Radiologist, MD;  Location: [facility];  Service: Radiology;  Laterality: N/A;  . RADIOLOGY WITH ANESTHESIA N/A 12/10/2016   Procedure: MRI OF BRAIN WITH AND WITHOUT;  Surgeon: Medication Radiologist, MD;  Location: [facility];  Service: Radiology;  Laterality: N/A;  . RADIOLOGY WITH ANESTHESIA N/A 03/02/2017   Procedure: MRI of BRAIN W and W/OUT CONTRAST;  Surgeon: Medication Radiologist, MD;  Location: [facility];  Service: Radiology;  Laterality: N/A;  .  RADIOLOGY WITH ANESTHESIA N/A 07/29/2017   Procedure: RADIOLOGY WITH ANESTHESIA MRI OF BRAIN WITH AND WITHOUT CONTRAST;  Surgeon: Radiologist, Medication, MD;  Location: [facility];  Service: Radiology;  Laterality: N/A;  . RADIOLOGY WITH ANESTHESIA N/A 12/07/2017   Procedure: MRI WITH ANESTHESIA OF BRAIN WITH AND WITHOUT CONTRAST;  Surgeon: Radiologist, Medication, MD;  Location: [facility];  Service: Radiology;  Laterality: N/A;  . RADIOLOGY WITH ANESTHESIA N/A 04/07/2018   Procedure: MRI OF BRAIN WITH AND WITHOUT CONTRAST;  Surgeon: Radiologist, Medication, MD;  Location: [facility];  Service: Radiology;  Laterality: N/A;  . RADIOLOGY WITH ANESTHESIA N/A 08/23/2018   Procedure: MRI WITH ANESTHESIA OF THE BRAIN WITH AND WITHOUT;  Surgeon: Radiologist, Medication, MD;  Location: [facility];  Service: Radiology;  Laterality: N/A;  . RADIOLOGY WITH ANESTHESIA N/A 01/24/2019   Procedure: MRI OF BRAIN WITH AND WITHOUT CONTRAST;  Surgeon: Radiologist, Medication, MD;  Location: [facility];  Service: Radiology;  Laterality: N/A;  . RADIOLOGY WITH ANESTHESIA N/A 07/06/2019   Procedure: MRI WITH ANESTHESIA OF BRAIN WITH AND WITHOUT CONTRAST;  Surgeon: Radiologist, Medication, MD;  Location: [facility];  Service: Radiology;  Laterality: N/A;  . RADIOLOGY WITH ANESTHESIA N/A 01/11/2020   Procedure: MRI WITH ANESTHESIA BRAIN WITH AND WITHOUT;  Surgeon: Radiologist, Medication, MD;  Location: [facility];  Service: Radiology;  Laterality: N/A;  . RADIOLOGY WITH ANESTHESIA N/A 05/21/2020   Procedure: MRI WITH ANESTHESIA OF BRAIN WITH AND WITHOUT CONTRAST;  Surgeon: Radiologist, Medication, MD;  Location: [facility];  Service: Radiology;  Laterality: N/A;  . right power port placement Right     FAMILY HISTORY Family History  Problem Relation Age of Onset  . Diabetes Mother   . Bipolar disorder Mother   . CAD Father    The patient's father still living, age 54, in [place]. He had prostate cancer at some point in the past. The patient's mother died  at age 59 from complications of diabetes. The patient had no brothers, 2 sisters. A paternal grandmother had lung cancer in the setting of tobacco abuse. There is no other history of cancer in the family to her knowledge   GYNECOLOGIC HISTORY:  No LMP recorded. Patient has had an ablation. Menarche approximately age 4. First live birth in 54. The patient is GX P2. She underwent endometrial ablation in 2016.   SOCIAL HISTORY: Updated August 2019 The patient is originally from [place]. She has lived in [place] before but more recently was in [place]. She is back here because she could not afford her rent in [place]. She is divorced. Her 2 children are [name] who lives in [place] and works as a Development worker, community, and [name] who also lives in [place] and works as a Catering manager. The patient has a grandchild, Arelia Longest, 40 years old as of July 2019, living in [place] with his mother. The patient  also has a grandson born in February 2019, who lives in [place]. The patient has not established herself with a local church yet.    ADVANCED DIRECTIVES: Not in place; at the 06/03/2016 visit the patient was given the appropriate forms to complete and notarize at her discretion   HEALTH MAINTENANCE: Social History   Tobacco Use  . Smoking status: Former Smoker    Packs/day: 1.00    Years: 43.00    Pack years: 43.00    Types: Cigarettes  . Smokeless tobacco: Never Used  . Tobacco comment: 3 day since last cigarette  Vaping Use  . Vaping Use: Some days  Substance Use Topics  . Alcohol use: No    Comment: no ETOH since 08/22/12  . Drug use: No    Comment: states she's in recovery program for 7 years     Colonoscopy:  PAP:  Bone density:   Allergies  Allergen Reactions  . Demerol [Meperidine Hcl] Itching and Nausea And Vomiting    INTOLERANCE >  N & V  . Erythromycin Rash    Current Outpatient Medications on File Prior to Visit  Medication Sig Dispense Refill  . acetaminophen  (TYLENOL) 500 MG tablet Take 1,000 mg by mouth every 6 (six) hours as needed for moderate pain.    [name] anastrozole (ARIMIDEX) 1 MG tablet TAKE 1 TABLET BY MOUTH EVERY DAY 90 tablet 1  . azelastine (ASTELIN) 0.1 % nasal spray Place 2 sprays into both nostrils 2 (two) times daily. Use in each nostril as directed 30 mL 12  . carvedilol (COREG) 3.125 MG tablet Take 1 tablet (3.125 mg total) by mouth 2 (two) times daily for 10 days, THEN 2 tablets (6.25 mg total) 2 (two) times daily. 120 tablet 3  . Cholecalciferol (VITAMIN D3) 250 MCG (10000 UT) capsule Take 10,000 Units by mouth daily.     . cyclobenzaprine (FLEXERIL) 10 MG tablet TAKE 1 TABLET BY MOUTH TWICE A DAY 60 tablet 0  . cycloSPORINE (RESTASIS) 0.05 % ophthalmic emulsion Place 1 drop into both eyes 2 (two) times daily as needed (dry eyes).     . fluticasone (FLONASE) 50 MCG/ACT nasal spray SPRAY 2 SPRAYS INTO EACH NOSTRIL EVERY DAY 16 mL 2  . gabapentin (NEURONTIN) 300 MG capsule Take 1-2 capsules (300-600 mg total) by mouth See admin instructions. Take 300 mg by mouth in the morning and take 600 mg by mouth at bedtime 90 capsule 0  . ibuprofen (ADVIL) 800 MG tablet TAKE 0.5-1 TABLETS (400-800 MG TOTAL) BY MOUTH 2 (TWO) TIMES DAILY AS NEEDED. TAKE WITH FOOD. 90 tablet 0  . lamoTRIgine (LAMICTAL) 100 MG tablet Take 100 mg by mouth every morning.    . loratadine (CLARITIN) 10 MG tablet Take 1 tablet (10 mg total) by mouth daily. 90 tablet 3  . Lurasidone HCl (LATUDA) 60 MG TABS Take 60 mg by mouth daily.    . ondansetron (ZOFRAN) 8 MG tablet Take 1 tablet (8 mg total) by mouth every 8 (eight) hours as needed for nausea or vomiting. 90 tablet 1  . pantoprazole (PROTONIX) 40 MG tablet Take 1 tablet (40 mg total) by mouth daily. 90 tablet 3  . polyethylene glycol (MIRALAX) 17 g packet Take 17 g by mouth daily as needed for mild constipation.    . rosuvastatin (CRESTOR) 10 MG tablet Take 1 tablet (10 mg total) by mouth daily. 90 tablet 3  . traZODone  (DESYREL) 100 MG tablet Take 1 tablet (100 mg total) by  mouth at bedtime. (Patient taking differently: Take 300 mg by mouth at bedtime. ) 30 tablet 0   Current Facility-Administered Medications on File Prior to Visit  Medication Dose Route Frequency Provider Last Rate Last Admin  . heparin lock flush 100 unit/mL  500 Units Intracatheter Once PRN Magrinat, Virgie Dad, MD      . sodium chloride flush (NS) 0.9 % injection 10 mL  10 mL Intracatheter PRN Magrinat, Virgie Dad, MD      . trastuzumab-dkst (OGIVRI) 399 mg in sodium chloride 0.9 % 250 mL chemo infusion  6 mg/kg (Treatment Plan Recorded) Intravenous Once Magrinat, Virgie Dad, MD 538 mL/hr at 09/11/20 1050 399 mg at 09/11/20 1050    OBJECTIVE:  Vitals:   09/11/20 0913  BP: 98/63  Pulse: 87  Resp: 18  Temp: (!) 97.5 F (36.4 C)  TempSrc: Tympanic  SpO2: 98%  Weight: 147 lb 1.6 oz (66.7 kg)  Height: 5' 5"  (1.651 m)  Body mass index is 24.48 kg/m.  ECOG FS: 1 - Symptomatic but completely ambulatory  GENERAL: Patient is a well appearing female in no acute distress HEENT:  Sclerae anicteric.  Mask in place. Neck is supple.  NODES:  No cervical, supraclavicular, or axillary lymphadenopathy palpated.  BREAST EXAM: Declined exam today LUNGS:  Clear to auscultation bilaterally.  No wheezes or rhonchi. HEART:  Regular rate and rhythm. No murmur appreciated. ABDOMEN:  Soft, nontender.  Positive, normoactive bowel sounds. No organomegaly palpated. MSK:  No focal spinal tenderness to palpation. Full range of motion bilaterally in the upper extremities. EXTREMITIES: + left arm lymphedema present and stable SKIN:  Clear with no obvious rashes or skin changes. No nail dyscrasia. NEURO:  Nonfocal. Well oriented.  Appropriate affect.    LAB RESULTS: No results found for: LABCA2  CBC    Component Value Date/Time   WBC 3.6 (L) 09/11/2020 0850   RBC 3.53 (L) 09/11/2020 0850   HGB 10.8 (L) 09/11/2020 0850   HGB 13.5 07/05/2019 1129   HGB  12.1 10/26/2017 1038   HCT 32.2 (L) 09/11/2020 0850   HCT 35.7 10/26/2017 1038   PLT 78 (L) 09/11/2020 0850   PLT 54 (L) 07/05/2019 1129   PLT 167 10/26/2017 1038   MCV 91.2 09/11/2020 0850   MCV 98.4 10/26/2017 1038   MCH 30.6 09/11/2020 0850   MCHC 33.5 09/11/2020 0850   RDW 13.7 09/11/2020 0850   RDW 13.3 10/26/2017 1038   LYMPHSABS 0.8 09/11/2020 0850   LYMPHSABS 0.5 (L) 10/26/2017 1038   MONOABS 0.3 09/11/2020 0850   MONOABS 0.1 10/26/2017 1038   EOSABS 0.0 09/11/2020 0850   EOSABS 0.0 10/26/2017 1038   BASOSABS 0.0 09/11/2020 0850   BASOSABS 0.0 10/26/2017 1038   CMP Latest Ref Rng & Units 09/11/2020 08/21/2020 08/01/2020  Glucose 70 - 99 mg/dL 88 90 87  BUN 6 - 20 mg/dL 16 12 10   Creatinine 0.44 - 1.00 mg/dL 1.16(H) 1.03(H) 0.94  Sodium 135 - 145 mmol/L 139 140 140  Potassium 3.5 - 5.1 mmol/L 4.4 4.0 4.3  Chloride 98 - 111 mmol/L 101 102 103  CO2 22 - 32 mmol/L 34(H) 32 31  Calcium 8.9 - 10.3 mg/dL 9.9 9.2 10.2  Total Protein 6.5 - 8.1 g/dL 6.5 6.3(L) 6.6  Total Bilirubin 0.3 - 1.2 mg/dL 0.3 0.4 0.4  Alkaline Phos 38 - 126 U/L 60 58 74  AST 15 - 41 U/L 22 25 24   ALT 0 - 44 U/L 18 22 20  STUDIES: ECHOCARDIOGRAM COMPLETE  Result Date: 08/20/2020    ECHOCARDIOGRAM REPORT   Patient Name:   VEDHA TERCERO Date of Exam: 08/20/2020 Medical Rec #:  [id number]    Height:       65.0 in Accession #:    [id number]   Weight:       143.9 lb Date of Birth:  1961-04-04    BSA:          1.720 m Patient Age:    6 years     BP:           124/78 mmHg Patient Gender: F            HR:           65 bpm. Exam Location:  Outpatient Procedure: 2D Echo, Color Doppler and Strain Analysis Indications:    Breast cancer followup  History:        Patient has prior history of Echocardiogram examinations.  Sonographer:    NA Referring Phys: [place]  1. Left ventricular ejection fraction, by estimation, is 55 to 60%. The left ventricle has normal function. The left ventricle has no  regional wall motion abnormalities. Left ventricular diastolic parameters were normal. GLS -15.4%, but tracking of endocardium does not appear ideal.  2. Right ventricular systolic function is normal. The right ventricular size is normal. Tricuspid regurgitation signal is inadequate for assessing PA pressure.  3. The mitral valve is normal in structure. No evidence of mitral valve regurgitation. No evidence of mitral stenosis.  4. The aortic valve is normal in structure. Aortic valve regurgitation is not visualized. No aortic stenosis is present.  5. The inferior vena cava is normal in size with greater than 50% respiratory variability, suggesting right atrial pressure of 3 mmHg.  6. Technically difficult study. FINDINGS  Left Ventricle: Left ventricular ejection fraction, by estimation, is 55 to 60%. The left ventricle has normal function. The left ventricle has no regional wall motion abnormalities. The left ventricular internal cavity size was normal in size. There is  no left ventricular hypertrophy. Left ventricular diastolic parameters were normal. Right Ventricle: The right ventricular size is normal. No increase in right ventricular wall thickness. Right ventricular systolic function is normal. Tricuspid regurgitation signal is inadequate for assessing PA pressure. Left Atrium: Left atrial size was normal in size. Right Atrium: Right atrial size was normal in size. Pericardium: There is no evidence of pericardial effusion. Mitral Valve: The mitral valve is normal in structure. No evidence of mitral valve regurgitation. No evidence of mitral valve stenosis. Tricuspid Valve: The tricuspid valve is normal in structure. Tricuspid valve regurgitation is not demonstrated. Aortic Valve: The aortic valve is normal in structure. Aortic valve regurgitation is not visualized. No aortic stenosis is present. Pulmonic Valve: The pulmonic valve was not well visualized. Pulmonic valve regurgitation is not visualized. Aorta:  The aortic root is normal in size and structure. Venous: The inferior vena cava is normal in size with greater than 50% respiratory variability, suggesting right atrial pressure of 3 mmHg. IAS/Shunts: No atrial level shunt detected by color flow Doppler.  LEFT VENTRICLE PLAX 2D LVIDd:         3.50 cm  Diastology LVIDs:         2.80 cm  LV e' medial:   12.10 cm/s LV PW:         1.20 cm  LV E/e' medial: 6.4 LV IVS:        1.10 cm LVOT  diam:     2.10 cm LV SV:         50 LV SV Index:   29 LVOT Area:     3.46 cm  RIGHT VENTRICLE RV S prime:     9.57 cm/s TAPSE (M-mode): 2.0 cm LEFT ATRIUM           Index LA diam:      2.90 cm 1.69 cm/m LA Vol (A2C): 22.2 ml 12.91 ml/m  AORTIC VALVE LVOT Vmax:   69.80 cm/s LVOT Vmean:  48.900 cm/s LVOT VTI:    0.144 m  AORTA Ao Root diam: 3.10 cm MITRAL VALVE MV Area (PHT): 4.49 cm    SHUNTS MV Decel Time: 169 msec    Systemic VTI:  0.14 m MV E velocity: 78.00 cm/s  Systemic Diam: 2.10 cm MV A velocity: 36.40 cm/s MV E/A ratio:  2.14 Loralie Champagne MD Electronically signed by Loralie Champagne MD Signature Date/Time: 08/20/2020/11:06:24 AM    Final     ELIGIBLE FOR AVAILABLE RESEARCH PROTOCOL: no  ASSESSMENT: 59 y.o. 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  (2) status post left mastectomy and axillary lymph node dissection  with immediate expander placement 07/18/2015 for an mpT4 pN2,stage IIIB invasive ductal carcinoma, grade 3, with negative margins.  (a) definitive implant exchange to be scheduled in December   METASTATIC DISEASE: October 2016  (3) CT scan of the chest abdomen and pelvis  09/16/2015 shows metastatic lesions in the right scapula, left iliac crest, L4, and T spine. There were questionable liver cysts, with repeat CT scan 03/02/2016 showing possible right upper lobe  lung lesions and possibly increased liver lesions  (a) CT scan of the chest 06/17/2016 shows no active disease in the lungs or liver  (b) Bone scan July 2017 showed no evidence of bony metastatic disease   (c) head CT 07/08/2016 showed a cerebellar lesion, confirmed by MRI 07/23/2016, status post craniotomy 08/14/2016, confirming a metastatic deposit which was estrogen and progesterone receptor negative, HER-2 amplified with a signals ratio of 7.16, number per cell 13.25  (d) CA 27-29 is not informative  (4) received docetaxel every 3 weeks 6 together with trastuzumab and pertuzumab, last docetaxel dose 02/11/2016  (5) adjuvant radiation7/03/2016 to 06/26/2016 at [facility]: 1. The Left chest wall was treated to 23.4 Gy in 13 fractions at 1.8 Gy per fraction. 2. The Left chest wall was boosted to 10 Gy in 5 fractions at 2 Gy per fraction. 3. The Left Sclav/PAB was treated to 23.4 Gy in 13 fractions at 1.8 Gy per fraction.  [Note: Including the patient's treatment in [place] (received 15 fractions per Dr. Maryan Rued near [place], [place]), the patient received 50.4 Gy to the left chest wall and supraclavicular region. ]  (6) started trastuzumab and pertuzumab October 2016, continuing every 3 weeks,  (a) echocardiogram 02/26/2016 showed a well preserved ejection fraction  (b) echocardiogram 07/01/2016 shows an ejection fraction in the 60-65%   (c) pertuzumab discontinued 10/2016 with uncontrolled diarrhea  (d) echocardiogram 11/11/2016 showed an ejection fraction in the 60-65%  (e) echocardiogram 03/03/2017 shows an ejection fraction of 60-65%  (f) echocardiogram on 05/19/2017 shows an ejection fraction of 55-60%  (g) echocardiogram 09/24/2017 shows the ejection fraction in the 60-65%  (h) echocardiogram 02/14/2018 shows an ejection fraction in the 60-65%  (I) echocardiogram  06/30/2018 shows an ejection fraction in the 55-60%  (m) echocardiogram on 12/08/2018 shows an ejection fraction in the  60-65% range  (7) started denosumab/Xgeva October 2017 given every 4 weeks, transitioned to every 8 weeks beginning 10/11/18 (every 6 weeks while giving TDM1 every 3 weeks)  (8) started anastrozole October 2017   (a) bone scan 11/10/2016 shows no active disease  (b) chest CT scan 11/10/2016 stable, with no evidence of active disease  (c) chest CT and bone scan 07/02/2017 show no evidence of active disease  (d) CT scan of the chest with contrast 11/10/2017 shows some left axillary edema, but no evidence of thrombosis or adenopathy in that area, 0.9 cm precarinal lymph and 0.7 cm right upper lobe nodule node; bone lesions stable  (e) CT of the chest 05/04/2018 shows a 1.4 cm right lower paratracheal node which is slightly increased and a new right prevascular mediastinal node measuring 0.7 cm.  Bone lesions are stable.  (f) chest CT on 07/01/2018 shows no definite findings of metastatic disease in the thorax. Previously noted borderline enlarged low right paratracheal lymph node is stable to slightly decreased in size   (e) chest CT on 12/30/2018 notes mild increase in right paratracheal adneopathy, recommended PET scan.  Pet scan on 01/19/2019 showed a hypermetabolic and enlarged right paratracheal lymph node consistent with breast cancer recurrence.  (f)Trastuzumab discontinued due to February 2020 scans   (g) TDM-1 started on 01/31/2019 given every 21 days.  (h) Chest CT 05/15/2019 shows decrease in mediastinal adenopathy  (i) CT chest on 08/17/2019 shows resolution of thoracic adenopathy  (j) TDM 1 discontinued after 10/11/2019 dose (8 months treatment) because of thrombocytopenia  (k) trastuzumab resumed 11/01/2019, repeated every 21 days  (l) echocardiogram 12/06/2019 showed an ejection fraction in the 60-65% range  (m) chest CT scan, brain CT and bone scan 01/31/2020 show no evidence of active disease  (9) history of bipolar disorder  (a) currently on Lamictal and Latuda as well as Desiree L  and Neurontin  (10) mild anemia with a significant drop in the MCV, ferritin 10 06/03/2016,   (a) Feraheme given 06/12/2016 and 06/18/2016  (11) tobacco abuse: Chantix started 06/18/2016--she is not currently trying to quit   (12) 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  (a) brain irradiation10/19/17-11/08/17: Whole brain/ 35 Gy in 14 fractions   (b) repeat brain MRI obtained 12/10/2016 shows no active disease in the brain  (c) repeat brain MRI 03/02/2017 shows no evidence of residual or recurrent disease  (d) repeat brain MRI 07/29/2017 shows no evidence of residual or recurrent disease  (e) repeat brain MRI 12/07/2017 shows no evidence of disease recurrence.  There is progressive white matter change secondary to prior treatment.  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  She has no clinical signs of progression today, and will proceed with Trastuzumab given every 3 weeks.  She will also continue on her daily Anastrozole.    We will hold the Xgeva due to her recent dental extraction.  This is healing well.  She will undergo xray of her lumbar spine and pelvis today to evaluate this pain.  I counseled her to only take the Ibuprofen as prescribed.  I also instructed her to increase her fluid intake as her kidney function is slightly elevated, ? Secondary to increased use of ibuprofen.  Iliyana was recommended to continue with eating fruits/vegetables, drink plenty of water.  Her Miralax and stool softeners were paid for at the [facility].    She  will return in 3 weeks for her next treatment.  She knows to call for any questions that may arise between now and her next appointment.  We are happy to see her sooner if needed.  Total encounter time 30 minutes.Wilber Bihari, NP 09/11/20 11:09 AM Medical Oncology and Hematology [facility] [place], [place] [address] Tel. [phone]    Fax. [phone]   *Total Encounter Time as defined by the Centers for Medicare and Medicaid Services includes, in addition to the face-to-face time of a patient visit (documented in the note above) non-face-to-face time: obtaining and reviewing outside history, ordering and reviewing medications, tests or procedures, care coordination (communications with other health care professionals or caregivers) and documentation in the medical record.

## 2020-09-11 NOTE — Patient Instructions (Signed)
[facility] Discharge Instructions for Patients Receiving Chemotherapy  Today you received the following chemotherapy agents: Ogivri   To help prevent nausea and vomiting after your treatment, we encourage you to take your nausea medication as directed.    If you develop nausea and vomiting that is not controlled by your nausea medication, call the clinic.   BELOW ARE SYMPTOMS THAT SHOULD BE REPORTED IMMEDIATELY:  *FEVER GREATER THAN 100.5 F  *CHILLS WITH OR WITHOUT FEVER  NAUSEA AND VOMITING THAT IS NOT CONTROLLED WITH YOUR NAUSEA MEDICATION  *UNUSUAL SHORTNESS OF BREATH  *UNUSUAL BRUISING OR BLEEDING  TENDERNESS IN MOUTH AND THROAT WITH OR WITHOUT PRESENCE OF ULCERS  *URINARY PROBLEMS  *BOWEL PROBLEMS  UNUSUAL RASH Items with * indicate a potential emergency and should be followed up as soon as possible.  Feel free to call the clinic should you have any questions or concerns. The clinic phone number is [phone].  Please show the CHEMO ALERT CARD at check-in to the Emergency Department and triage nurse.   

## 2020-09-11 NOTE — Progress Notes (Signed)
[place] CSW Progress Note  Holiday representative met with patient to provide 3rd of 4 installments of Medtronic. Patient also having trouble paying for Miralax out of pocket currently. CSW explored options through good rx and found lower price at [facility], recommended patient purchase generic there. Patient has already received [employer] and used all of her [employer].     Edwinna Areola Hildegarde Dunaway , LCSW

## 2020-09-12 ENCOUNTER — Telehealth: Payer: Self-pay | Admitting: Oncology

## 2020-09-12 NOTE — Telephone Encounter (Signed)
Scheduled per 10/13 los. Called and spoke with pt, confirmed 11/3 and 11/24 appts

## 2020-09-12 NOTE — Telephone Encounter (Signed)
Patient checking on the status of cyclobenzaprine (FLEXERIL) 10 MG tablet and would like request expedited as soon as possible. Patient would like a follow up call when completed.   *patient states she's disappointment that request has not been met yet"   [id number] [place] [address] [id number]

## 2020-09-12 NOTE — Telephone Encounter (Signed)
Copied from [place] [id number]. Topic: General - Other >> Sep 12, 2020 12:04 PM Rainey Pines A wrote: Patient requesting callback from Dr. Benard Rink nurse in regards to her medication request. Patient stateded tha tthe pharmacy has tried reaching office 3 times but hasnt heard anything back. Patient is now out of medication and would like a callback

## 2020-09-13 ENCOUNTER — Other Ambulatory Visit: Payer: Self-pay | Admitting: Family Medicine

## 2020-09-13 ENCOUNTER — Telehealth: Payer: Self-pay

## 2020-09-13 ENCOUNTER — Ambulatory Visit: Payer: Medicaid Other

## 2020-09-13 NOTE — Telephone Encounter (Signed)
Pt called to find out xray results. This nurse called radiology to have radiologist release report. Returned call to pt and explained per Dr [name] that results show no abnormalties. Pt states her pain has alleviated and verbalizes thanks & understanding.

## 2020-09-13 NOTE — Telephone Encounter (Signed)
Requested medication (s) are due for refill today: yes  Requested medication (s) are on the active medication list: yes  Last refill:  08/09/20  Future visit scheduled: no  Notes to clinic:  not delegated    Requested Prescriptions  Pending Prescriptions Disp Refills   cyclobenzaprine (FLEXERIL) 10 MG tablet [Pharmacy Med Name: CYCLOBENZAPRINE 10 MG TABLET] 60 tablet 0    Sig: TAKE 1 TABLET BY MOUTH TWICE A DAY      Not Delegated - Analgesics:  Muscle Relaxants Failed - 09/13/2020 11:00 AM      Failed - This refill cannot be delegated      Failed - Valid encounter within last 6 months    Recent Outpatient Visits           7 months ago Acute non-recurrent sinusitis of other sinus   [place], Enobong, MD   9 months ago Other constipation   [facility] Charlott Rakes, MD   1 year ago Metastatic breast cancer [facility])   [place], Enobong, MD   1 year ago Seasonal allergic rhinitis, unspecified trigger   [place], Patrick E, MD   1 year ago Bacterial vaginosis   [facility] Charlott Rakes, MD

## 2020-09-13 NOTE — Telephone Encounter (Signed)
Medication: cyclobenzaprine (FLEXERIL) 10 MG tablet [[id number]] - Patient is asking if this can be resent to the pharmacy.Apt Scheduled  Has the patient contacted their pharmacy? YES  (Agent: If no, request that the patient contact the pharmacy for the refill.) (Agent: If yes, when and what did the pharmacy advise?)  Preferred Pharmacy (with phone number or street name): [id number] [place] [place]  [address], [place] [address]  Phone:  [phone] Fax:  [phone]   Agent: Please be advised that RX refills may take up to 3 business days. We ask that you follow-up with your pharmacy.

## 2020-09-13 NOTE — Telephone Encounter (Signed)
   Notes to clinic Was refused earlier today due to needs office visit, visit is scheduled for 10/22/20, Not Delegated.

## 2020-09-16 ENCOUNTER — Telehealth: Payer: Self-pay

## 2020-09-16 NOTE — Telephone Encounter (Signed)
Called and given below message. She verbalized understanding. 

## 2020-09-16 NOTE — Telephone Encounter (Signed)
-----   Message from Gardenia Phlegm, NP sent at 09/16/2020  9:01 AM EDT ----- Hip and spine xrays are normal and show no cancer ----- Message ----- From: Interface, Rad Results In Sent: 09/13/2020   9:32 AM EDT To: Gardenia Phlegm, NP

## 2020-09-16 NOTE — Telephone Encounter (Signed)
Med refilled by PCP on  09/13/2020   Copied from [place] #[id number]. Topic: General - Other >> Sep 12, 2020 12:04 PM Rainey Pines A wrote: Patient requesting callback from Dr. Benard Rink nurse in regards to her medication request. Patient stateded tha tthe pharmacy has tried reaching office 3 times but hasnt heard anything back. Patient is now out of medication and would like a callback

## 2020-09-17 ENCOUNTER — Other Ambulatory Visit: Payer: Self-pay | Admitting: Family Medicine

## 2020-09-17 ENCOUNTER — Other Ambulatory Visit: Payer: Self-pay | Admitting: Oncology

## 2020-09-17 NOTE — Telephone Encounter (Signed)
Requested medication (s) are due for refill today: No  Requested medication (s) are on the active medication list: Yes  Last refill:  09/13/20  Future visit scheduled: Yes  Notes to clinic:  See request.    Requested Prescriptions  Pending Prescriptions Disp Refills   cyclobenzaprine (FLEXERIL) 10 MG tablet [Pharmacy Med Name: CYCLOBENZAPRINE 10 MG TABLET] 60 tablet 0    Sig: TAKE 1 TABLET BY MOUTH TWICE A DAY      Not Delegated - Analgesics:  Muscle Relaxants Failed - 09/17/2020  7:37 AM      Failed - This refill cannot be delegated      Failed - Valid encounter within last 6 months    Recent Outpatient Visits           7 months ago Acute non-recurrent sinusitis of other sinus   [place], En[place]ong, MD   10 months ago Other constipation   [facility] Charlott Rakes, MD   1 year ago Metastatic breast cancer [facility])   [place], [place], MD   1 year ago Seasonal allergic rhinitis, unspecified trigger   [place], Patrick E, MD   1 year ago Bacterial vaginosis   [place], MD       Future Appointments             In 1 month Charlott Rakes, MD [place]

## 2020-09-17 NOTE — Telephone Encounter (Signed)
Patient was called and a voicemail was left informing patient to return call with medication name that she is requesting a refill.

## 2020-09-19 ENCOUNTER — Other Ambulatory Visit (HOSPITAL_COMMUNITY): Payer: Medicaid Other

## 2020-09-19 MED ORDER — CYCLOBENZAPRINE HCL 10 MG PO TABS
10.0000 mg | ORAL_TABLET | Freq: Two times a day (BID) | ORAL | 1 refills | Status: DC
Start: 2020-09-19 — End: 2020-11-27

## 2020-09-19 NOTE — Telephone Encounter (Signed)
Requested medication (s) are due for refill today:  Yes  Requested medication (s) are on the active medication list:  Yes  Future visit scheduled:  no  Last Refill: 09/13/20; #60; no refills  Notes to clinic: medication is not delegated.   Requested Prescriptions  Pending Prescriptions Disp Refills   cyclobenzaprine (FLEXERIL) 10 MG tablet [Pharmacy Med Name: CYCLOBENZAPRINE 10 MG TABLET] 60 tablet 0    Sig: TAKE 1 TABLET BY MOUTH TWICE A DAY      Not Delegated - Analgesics:  Muscle Relaxants Failed - 09/19/2020 11:35 AM      Failed - This refill cannot be delegated      Failed - Valid encounter within last 6 months    Recent Outpatient Visits           7 months ago Acute non-recurrent sinusitis of other sinus   [place], Enobong, MD   10 months ago Other constipation   [facility] Charlott Rakes, MD   1 year ago Metastatic breast cancer [facility])   [place], Enobong, MD   1 year ago Seasonal allergic rhinitis, unspecified trigger   [place], Patrick E, MD   1 year ago Bacterial vaginosis   [place], MD       Future Appointments             In 1 month Charlott Rakes, MD [place]

## 2020-09-19 NOTE — Addendum Note (Signed)
Addended by: Charlott Rakes on: 09/19/2020 01:59 PM   Modules accepted: Orders

## 2020-09-19 NOTE — Telephone Encounter (Addendum)
The cyclobenzaprine did not go through the message e-scribed said verification status not available for this order. [facility] [address]

## 2020-09-23 ENCOUNTER — Telehealth: Payer: Self-pay

## 2020-09-23 NOTE — Telephone Encounter (Signed)
Copied from [place] [id number]. Topic: General - Other >> Sep 12, 2020 12:04 PM Rainey Pines A wrote: Patient requesting callback from Dr. Benard Rink nurse in regards to her medication request. Patient stateded tha tthe pharmacy has tried reaching office 3 times but hasnt heard anything back. Patient is now out of medication and would like a callback >> Sep 16, 2020  1:35 PM Keene Breath wrote: Patient called to check the status of her medication request.  She stated that the pharmacy still does not have the medication.  Please advise and call patient to discuss at [phone]

## 2020-09-24 ENCOUNTER — Ambulatory Visit: Payer: Medicaid Other

## 2020-09-24 ENCOUNTER — Other Ambulatory Visit: Payer: Self-pay | Admitting: Family Medicine

## 2020-09-24 ENCOUNTER — Other Ambulatory Visit: Payer: Self-pay | Admitting: Oncology

## 2020-09-24 DIAGNOSIS — J302 Other seasonal allergic rhinitis: Secondary | ICD-10-CM

## 2020-09-24 NOTE — Telephone Encounter (Signed)
Requested Prescriptions  Pending Prescriptions Disp Refills  . fluticasone (FLONASE) 50 MCG/ACT nasal spray [Pharmacy Med Name: FLUTICASONE PROP 50 MCG SPRAY] 16 mL 2    Sig: SPRAY 2 SPRAYS INTO EACH NOSTRIL EVERY DAY     Ear, Nose, and Throat: Nasal Preparations - Corticosteroids Passed - 09/24/2020  7:45 AM      Passed - Valid encounter within last 12 months    Recent Outpatient Visits          7 months ago Acute non-recurrent sinusitis of other sinus   [place], Enobong, MD   10 months ago Other constipation   [facility] Charlott Rakes, MD   1 year ago Metastatic breast cancer [facility])   [place], Enobong, MD   1 year ago Seasonal allergic rhinitis, unspecified trigger   [place] Elsie Stain, MD   1 year ago Bacterial vaginosis   [place], MD      Future Appointments            In 4 weeks Charlott Rakes, MD [place]

## 2020-09-27 ENCOUNTER — Ambulatory Visit: Payer: Medicaid Other

## 2020-10-01 ENCOUNTER — Ambulatory Visit: Payer: Medicaid Other | Admitting: Physical Therapy

## 2020-10-02 ENCOUNTER — Other Ambulatory Visit: Payer: Self-pay | Admitting: *Deleted

## 2020-10-02 ENCOUNTER — Encounter: Payer: Self-pay | Admitting: Licensed Clinical Social Worker

## 2020-10-02 ENCOUNTER — Inpatient Hospital Stay: Payer: Medicaid Other | Attending: Medical

## 2020-10-02 ENCOUNTER — Inpatient Hospital Stay: Payer: Medicaid Other

## 2020-10-02 ENCOUNTER — Telehealth (HOSPITAL_COMMUNITY): Payer: Self-pay | Admitting: *Deleted

## 2020-10-02 ENCOUNTER — Other Ambulatory Visit: Payer: Self-pay | Admitting: Oncology

## 2020-10-02 ENCOUNTER — Other Ambulatory Visit: Payer: Self-pay

## 2020-10-02 VITALS — BP 107/64 | HR 68 | Temp 98.1°F | Resp 18 | Ht 65.0 in | Wt 151.0 lb

## 2020-10-02 DIAGNOSIS — C7951 Secondary malignant neoplasm of bone: Secondary | ICD-10-CM

## 2020-10-02 DIAGNOSIS — F411 Generalized anxiety disorder: Secondary | ICD-10-CM

## 2020-10-02 DIAGNOSIS — C50312 Malignant neoplasm of lower-inner quadrant of left female breast: Secondary | ICD-10-CM

## 2020-10-02 DIAGNOSIS — C7931 Secondary malignant neoplasm of brain: Secondary | ICD-10-CM | POA: Diagnosis not present

## 2020-10-02 DIAGNOSIS — Z9012 Acquired absence of left breast and nipple: Secondary | ICD-10-CM | POA: Diagnosis not present

## 2020-10-02 DIAGNOSIS — Z5112 Encounter for antineoplastic immunotherapy: Secondary | ICD-10-CM | POA: Diagnosis not present

## 2020-10-02 DIAGNOSIS — Z170 Estrogen receptor positive status [ER+]: Secondary | ICD-10-CM | POA: Insufficient documentation

## 2020-10-02 DIAGNOSIS — K5900 Constipation, unspecified: Secondary | ICD-10-CM | POA: Insufficient documentation

## 2020-10-02 DIAGNOSIS — Z801 Family history of malignant neoplasm of trachea, bronchus and lung: Secondary | ICD-10-CM | POA: Diagnosis not present

## 2020-10-02 DIAGNOSIS — R5383 Other fatigue: Secondary | ICD-10-CM | POA: Insufficient documentation

## 2020-10-02 DIAGNOSIS — I519 Heart disease, unspecified: Secondary | ICD-10-CM | POA: Diagnosis not present

## 2020-10-02 DIAGNOSIS — Z95828 Presence of other vascular implants and grafts: Secondary | ICD-10-CM

## 2020-10-02 DIAGNOSIS — Z87891 Personal history of nicotine dependence: Secondary | ICD-10-CM | POA: Diagnosis not present

## 2020-10-02 DIAGNOSIS — F319 Bipolar disorder, unspecified: Secondary | ICD-10-CM | POA: Diagnosis not present

## 2020-10-02 DIAGNOSIS — D696 Thrombocytopenia, unspecified: Secondary | ICD-10-CM

## 2020-10-02 DIAGNOSIS — C770 Secondary and unspecified malignant neoplasm of lymph nodes of head, face and neck: Secondary | ICD-10-CM

## 2020-10-02 DIAGNOSIS — C50119 Malignant neoplasm of central portion of unspecified female breast: Secondary | ICD-10-CM

## 2020-10-02 DIAGNOSIS — Z79899 Other long term (current) drug therapy: Secondary | ICD-10-CM | POA: Insufficient documentation

## 2020-10-02 LAB — COMPREHENSIVE METABOLIC PANEL
ALT: 18 U/L (ref 0–44)
AST: 23 U/L (ref 15–41)
Albumin: 4 g/dL (ref 3.5–5.0)
Alkaline Phosphatase: 58 U/L (ref 38–126)
Anion gap: 7 (ref 5–15)
BUN: 9 mg/dL (ref 6–20)
CO2: 29 mmol/L (ref 22–32)
Calcium: 8.8 mg/dL — ABNORMAL LOW (ref 8.9–10.3)
Chloride: 106 mmol/L (ref 98–111)
Creatinine, Ser: 0.98 mg/dL (ref 0.44–1.00)
GFR, Estimated: >60 mL/min (ref >60)
Glucose, Bld: 100 mg/dL — ABNORMAL HIGH (ref 70–99)
Potassium: 4.2 mmol/L (ref 3.5–5.1)
Sodium: 142 mmol/L (ref 135–145)
Total Bilirubin: 0.4 mg/dL (ref 0.3–1.2)
Total Protein: 6.6 g/dL (ref 6.5–8.1)

## 2020-10-02 LAB — CBC WITH DIFFERENTIAL/PLATELET
Abs Immature Granulocytes: 0.01 10*3/uL (ref 0.00–0.07)
Basophils Absolute: 0 10*3/uL (ref 0.0–0.1)
Basophils Relative: 0 %
Eosinophils Absolute: 0 10*3/uL (ref 0.0–0.5)
Eosinophils Relative: 0 %
HCT: 32.3 % — ABNORMAL LOW (ref 36.0–46.0)
Hemoglobin: 10.7 g/dL — ABNORMAL LOW (ref 12.0–15.0)
Immature Granulocytes: 0 %
Lymphocytes Relative: 27 %
Lymphs Abs: 0.8 10*3/uL (ref 0.7–4.0)
MCH: 31.1 pg (ref 26.0–34.0)
MCHC: 33.1 g/dL (ref 30.0–36.0)
MCV: 93.9 fL (ref 80.0–100.0)
Monocytes Absolute: 0.3 10*3/uL (ref 0.1–1.0)
Monocytes Relative: 10 %
Neutro Abs: 2 10*3/uL (ref 1.7–7.7)
Neutrophils Relative %: 63 %
Platelets: 76 10*3/uL — ABNORMAL LOW (ref 150–400)
RBC: 3.44 MIL/uL — ABNORMAL LOW (ref 3.87–5.11)
RDW: 13.9 % (ref 11.5–15.5)
WBC: 3.2 10*3/uL — ABNORMAL LOW (ref 4.0–10.5)
nRBC: 0 % (ref 0.0–0.2)

## 2020-10-02 MED ORDER — PROCHLORPERAZINE MALEATE 10 MG PO TABS: 10.0000 mg | ORAL_TABLET | Freq: Four times a day (QID) | ORAL | 1 refills | Status: DC | PRN

## 2020-10-02 MED ORDER — ACETAMINOPHEN 325 MG PO TABS
ORAL_TABLET | ORAL | Status: AC
  Filled 2020-10-02: qty 2

## 2020-10-02 MED ORDER — TRASTUZUMAB-DKST CHEMO 150 MG IV SOLR
6.0000 mg/kg | Freq: Once | INTRAVENOUS | Status: AC
  Administered 2020-10-02: 399 mg via INTRAVENOUS
  Filled 2020-10-02: qty 19

## 2020-10-02 MED ORDER — ACETAMINOPHEN 325 MG PO TABS: 325.0000 mg | ORAL_TABLET | Freq: Once | ORAL | Status: DC

## 2020-10-02 MED ORDER — SODIUM CHLORIDE 0.9% FLUSH
10.0000 mL | INTRAVENOUS | Status: DC | PRN
  Administered 2020-10-02: 10 mL
  Filled 2020-10-02: qty 10

## 2020-10-02 MED ORDER — SODIUM CHLORIDE 0.9% FLUSH
10.0000 mL | Freq: Once | INTRAVENOUS | Status: DC
  Filled 2020-10-02: qty 10

## 2020-10-02 MED ORDER — OXYCODONE HCL 5 MG PO TABS
5.0000 mg | ORAL_TABLET | Freq: Once | ORAL | Status: AC
  Administered 2020-10-02: 5 mg via ORAL

## 2020-10-02 MED ORDER — OXYCODONE HCL 5 MG PO TABS
ORAL_TABLET | ORAL | Status: AC
  Filled 2020-10-02: qty 1

## 2020-10-02 MED ORDER — HEPARIN SOD (PORK) LOCK FLUSH 100 UNIT/ML IV SOLN
500.0000 [IU] | Freq: Once | INTRAVENOUS | Status: AC | PRN
  Administered 2020-10-02: 500 [IU]
  Filled 2020-10-02: qty 5

## 2020-10-02 MED ORDER — SODIUM CHLORIDE 0.9 % IV SOLN
Freq: Once | INTRAVENOUS | Status: AC
  Filled 2020-10-02: qty 250

## 2020-10-02 MED ORDER — ACETAMINOPHEN 325 MG PO TABS
650.0000 mg | ORAL_TABLET | Freq: Once | ORAL | Status: AC
  Administered 2020-10-02: 650 mg via ORAL

## 2020-10-02 NOTE — Patient Instructions (Signed)
[facility] Discharge Instructions for Patients Receiving Chemotherapy  Today you received the following chemotherapy agents trastuzumab.  To help prevent nausea and vomiting after your treatment, we encourage you to take your nausea medication as directed.    If you develop nausea and vomiting that is not controlled by your nausea medication, call the clinic.   BELOW ARE SYMPTOMS THAT SHOULD BE REPORTED IMMEDIATELY:  *FEVER GREATER THAN 100.5 F  *CHILLS WITH OR WITHOUT FEVER  NAUSEA AND VOMITING THAT IS NOT CONTROLLED WITH YOUR NAUSEA MEDICATION  *UNUSUAL SHORTNESS OF BREATH  *UNUSUAL BRUISING OR BLEEDING  TENDERNESS IN MOUTH AND THROAT WITH OR WITHOUT PRESENCE OF ULCERS  *URINARY PROBLEMS  *BOWEL PROBLEMS  UNUSUAL RASH Items with * indicate a potential emergency and should be followed up as soon as possible.  Feel free to call the clinic should you have any questions or concerns. The clinic phone number is [phone].  Please show the CHEMO ALERT CARD at check-in to the Emergency Department and triage nurse.   

## 2020-10-02 NOTE — Telephone Encounter (Signed)
Pt left VM requesting return call about missed lab appt. Called pt back no answer/left vm requesting return call.

## 2020-10-02 NOTE — Progress Notes (Signed)
[place] CSW Progress Note  Holiday representative met with patient to provide last installment of Medtronic. Pt denies any other needs today. CSW card given for patient to contact with any further needs.    Christeen Douglas , LCSW

## 2020-10-02 NOTE — Progress Notes (Signed)
Patient reports having a dental extraction on 08/27/2020. Per Dr. [name], hold denosumab dose today. Informed patient of the importance of notifying Dr. [name] if she has any dental work and notifying dentist she is currently receiving care for breast cancer. Patient verbalized understanding. While in treatment today, patient called her dentist and advised them to call Dr. [name] before any future dental work.

## 2020-10-04 ENCOUNTER — Ambulatory Visit: Payer: Medicaid Other

## 2020-10-08 ENCOUNTER — Other Ambulatory Visit: Payer: Self-pay | Admitting: Oncology

## 2020-10-08 ENCOUNTER — Other Ambulatory Visit: Payer: Self-pay

## 2020-10-08 ENCOUNTER — Ambulatory Visit: Payer: Medicaid Other | Admitting: Physical Therapy

## 2020-10-08 MED ORDER — DEXAMETHASONE 4 MG PO TABS: 4.0000 mg | ORAL_TABLET | Freq: Two times a day (BID) | ORAL | 0 refills | Status: DC

## 2020-10-08 NOTE — Telephone Encounter (Signed)
Pt called stating Compazine called in is not helping like her Zofran is. Pt lost Rx for Zofran and is not able to pay out of pocket for it since insurance will not cover. Pt states she is vomiting in the mornings and at night but she is drinking 4 "really big bottles" of water a day. Pt asks if there is anything else we can give her.   This LPN spoke with Dr [name], who would like for pt to come in to symptom management and have IVF and possible brain MRI. Pt declines offer for sx mgmt and states she does not need that, just wants a different antiemetic. Per Dr [name], Decadron 4 mg 1 tab PO BID qty 10 [facility] [address] [facility]. Pt is aware and verbalizes thanks and understanding.

## 2020-10-11 ENCOUNTER — Ambulatory Visit: Payer: Medicaid Other

## 2020-10-13 ENCOUNTER — Other Ambulatory Visit (HOSPITAL_COMMUNITY): Payer: Self-pay | Admitting: Cardiology

## 2020-10-14 ENCOUNTER — Telehealth: Payer: Self-pay

## 2020-10-14 NOTE — Telephone Encounter (Signed)
Pt called stating she would like to r/s appts made on 11/24 d/t being out of town that week. This LPN returned pt call and informed her scheduling would be in touch to accommodate schedule change. Message sent to scheduling.

## 2020-10-16 ENCOUNTER — Ambulatory Visit (HOSPITAL_COMMUNITY)
Admission: RE | Admit: 2020-10-16 | Discharge: 2020-10-16 | Disposition: A | Discharge status: Home / Self Care | Payer: Medicaid Other | Source: Ambulatory Visit | Attending: Cardiology | Admitting: Cardiology

## 2020-10-16 ENCOUNTER — Other Ambulatory Visit: Payer: Self-pay

## 2020-10-16 DIAGNOSIS — E7800 Pure hypercholesterolemia, unspecified: Secondary | ICD-10-CM | POA: Insufficient documentation

## 2020-10-16 LAB — HEPATIC FUNCTION PANEL
ALT: 23 U/L (ref 0–44)
AST: 20 U/L (ref 15–41)
Albumin: 3.6 g/dL (ref 3.5–5.0)
Alkaline Phosphatase: 43 U/L (ref 38–126)
Bilirubin, Direct: 0.1 mg/dL (ref 0.0–0.2)
Indirect Bilirubin: 0.1 mg/dL — ABNORMAL LOW (ref 0.3–0.9)
Total Bilirubin: 0.2 mg/dL — ABNORMAL LOW (ref 0.3–1.2)
Total Protein: 6.1 g/dL — ABNORMAL LOW (ref 6.5–8.1)

## 2020-10-16 LAB — LIPID PANEL
Cholesterol: 146 mg/dL (ref 0–200)
HDL: 57 mg/dL (ref >40)
LDL Cholesterol: 71 mg/dL (ref 0–99)
Total CHOL/HDL Ratio: 2.6 RATIO
Triglycerides: 91 mg/dL (ref <150)
VLDL: 18 mg/dL (ref 0–40)

## 2020-10-17 ENCOUNTER — Telehealth: Payer: Self-pay | Admitting: *Deleted

## 2020-10-17 MED ORDER — METRONIDAZOLE 500 MG PO TABS: ORAL_TABLET | ORAL | 0 refills | Status: DC

## 2020-10-17 NOTE — Telephone Encounter (Signed)
[name] left a message stating she has a vaginal infection.  [facility] Pt notified

## 2020-10-20 ENCOUNTER — Other Ambulatory Visit: Payer: Self-pay | Admitting: Medical

## 2020-10-21 ENCOUNTER — Inpatient Hospital Stay: Payer: Medicaid Other

## 2020-10-21 ENCOUNTER — Other Ambulatory Visit: Payer: Self-pay

## 2020-10-21 ENCOUNTER — Encounter: Payer: Self-pay | Admitting: Adult Health

## 2020-10-21 ENCOUNTER — Inpatient Hospital Stay (HOSPITAL_BASED_OUTPATIENT_CLINIC_OR_DEPARTMENT_OTHER): Payer: Medicaid Other | Admitting: Adult Health

## 2020-10-21 ENCOUNTER — Telehealth: Payer: Self-pay | Admitting: Oncology

## 2020-10-21 VITALS — BP 101/69 | HR 70 | Temp 97.7°F | Resp 18 | Ht 65.0 in | Wt 152.2 lb

## 2020-10-21 DIAGNOSIS — C7931 Secondary malignant neoplasm of brain: Secondary | ICD-10-CM | POA: Diagnosis not present

## 2020-10-21 DIAGNOSIS — C7951 Secondary malignant neoplasm of bone: Secondary | ICD-10-CM | POA: Diagnosis not present

## 2020-10-21 DIAGNOSIS — C50312 Malignant neoplasm of lower-inner quadrant of left female breast: Secondary | ICD-10-CM

## 2020-10-21 DIAGNOSIS — Z95828 Presence of other vascular implants and grafts: Secondary | ICD-10-CM

## 2020-10-21 DIAGNOSIS — Z5112 Encounter for antineoplastic immunotherapy: Secondary | ICD-10-CM | POA: Diagnosis not present

## 2020-10-21 DIAGNOSIS — C50011 Malignant neoplasm of nipple and areola, right female breast: Secondary | ICD-10-CM | POA: Diagnosis not present

## 2020-10-21 DIAGNOSIS — C50012 Malignant neoplasm of nipple and areola, left female breast: Secondary | ICD-10-CM | POA: Diagnosis not present

## 2020-10-21 DIAGNOSIS — F411 Generalized anxiety disorder: Secondary | ICD-10-CM

## 2020-10-21 LAB — CBC WITH DIFFERENTIAL/PLATELET
Abs Immature Granulocytes: 0.08 10*3/uL — ABNORMAL HIGH (ref 0.00–0.07)
Basophils Absolute: 0 10*3/uL (ref 0.0–0.1)
Basophils Relative: 0 %
Eosinophils Absolute: 0 10*3/uL (ref 0.0–0.5)
Eosinophils Relative: 0 %
HCT: 32.4 % — ABNORMAL LOW (ref 36.0–46.0)
Hemoglobin: 10.7 g/dL — ABNORMAL LOW (ref 12.0–15.0)
Immature Granulocytes: 2 %
Lymphocytes Relative: 23 %
Lymphs Abs: 1.1 10*3/uL (ref 0.7–4.0)
MCH: 31.8 pg (ref 26.0–34.0)
MCHC: 33 g/dL (ref 30.0–36.0)
MCV: 96.4 fL (ref 80.0–100.0)
Monocytes Absolute: 0.4 10*3/uL (ref 0.1–1.0)
Monocytes Relative: 8 %
Neutro Abs: 3.1 10*3/uL (ref 1.7–7.7)
Neutrophils Relative %: 67 %
Platelets: 69 10*3/uL — ABNORMAL LOW (ref 150–400)
RBC: 3.36 MIL/uL — ABNORMAL LOW (ref 3.87–5.11)
RDW: 13.4 % (ref 11.5–15.5)
WBC: 4.6 10*3/uL (ref 4.0–10.5)
nRBC: 0 % (ref 0.0–0.2)

## 2020-10-21 LAB — COMPREHENSIVE METABOLIC PANEL
ALT: 18 U/L (ref 0–44)
AST: 22 U/L (ref 15–41)
Albumin: 3.6 g/dL (ref 3.5–5.0)
Alkaline Phosphatase: 48 U/L (ref 38–126)
Anion gap: 5 (ref 5–15)
BUN: 11 mg/dL (ref 6–20)
CO2: 31 mmol/L (ref 22–32)
Calcium: 9.1 mg/dL (ref 8.9–10.3)
Chloride: 103 mmol/L (ref 98–111)
Creatinine, Ser: 0.96 mg/dL (ref 0.44–1.00)
GFR, Estimated: >60 mL/min (ref >60)
Glucose, Bld: 101 mg/dL — ABNORMAL HIGH (ref 70–99)
Potassium: 4.1 mmol/L (ref 3.5–5.1)
Sodium: 139 mmol/L (ref 135–145)
Total Bilirubin: 0.4 mg/dL (ref 0.3–1.2)
Total Protein: 6.2 g/dL — ABNORMAL LOW (ref 6.5–8.1)

## 2020-10-21 MED ORDER — HEPARIN SOD (PORK) LOCK FLUSH 100 UNIT/ML IV SOLN
500.0000 [IU] | Freq: Once | INTRAVENOUS | Status: AC
  Administered 2020-10-21: 500 [IU]
  Filled 2020-10-21: qty 5

## 2020-10-21 MED ORDER — SODIUM CHLORIDE 0.9% FLUSH
10.0000 mL | Freq: Once | INTRAVENOUS | Status: AC
  Administered 2020-10-21: 10 mL
  Filled 2020-10-21: qty 10

## 2020-10-21 NOTE — Progress Notes (Signed)
[name]  [phone] [phone]     ID: [name] DOB: 06-Jul-1961  MR#: [id number]  DGU#:[id number]  Patient Care Team: Shannon Rakes, MD as PCP - General (Family Medicine) Shannon Dresser, MD as PCP - Advanced Heart Failure (Cardiology) Shannon Rudd, MD as Consulting Physician (Radiation Oncology) Shannon Levine, MD as Consulting Physician (Neurosurgery) Shannon Pilgrim, MD as Consulting Physician (Psychiatry) Shannon Skinner Acey Lav, MD as Consulting Physician (Psychiatry) Shannon Settle, MD as Referring Physician (Plastic Surgery) Shannon Limbo, MD as Consulting Physician (Plastic Surgery) [name], Shannon Lofty, DO as Attending Physician (Plastic Surgery) [name], Shannon Dad, MD as Consulting Physician (Oncology)   CHIEF COMPLAINT: Metastatic triple positive breast cancer  CURRENT TREATMENT: trastuzumab [every 21 days], denosumab/Xgeva (Q6W), anastrozole   INTERVAL HISTORY: [name] returns today for follow up and treatment of her metastatic triple positive breast cancer.  She continues on trastuzumab, given every 21 days.  We switched back to this for maintenance after she took the TDM 1 for 8 months. Her most recent echocardiogram on 08/20/2020 showed an EF of 55-60%, this is a slight drop and her heart strain was worse, Dr. Aundra Dubin is following her for this, and believes the decline is likely due to the exam itself and technique, rather than a true reading.  She will see him again in 10/2020 for repeat echo and f/u.   She is tolerating denosumab/Xgeva every 6 weeks and has no issues with taking this.   Her dental extraction is still healing.  The xgeva was held 6 weeks ago due to this.    She continues on anastrozole with good tolerance.    Her most recent restaging brain MRI that was negative for metastatic disease on 05/22/2020, and she will undergo repeat in 10/2020.  She underwent CT chest and bone scan in 05/2020 which showed no sign of  cancer progression.     REVIEW OF SYSTEMS: [name] notes she remains constipated.  She ran out of stool softeners and laxatives.  She notes when she has these she is able to go to the bathroom and have regular bowel movements.  She denies any new pain in her chest, abdomen or pelvis.  She continues ot have back pain occasionally radiating down her right leg.  She notes that she is increasingly weak and isn't as active as she previously was, however her weight is stable.    Otherwise, a detailed ROS was non contributory.    BREAST CANCER HISTORY: From the original intake note:  [name] was aware of a "lemon sized lump in" her left axilla for about a year before bringing it to medical attention. By then she had developed left breast and left axillary swelling (June 2016). She presented to the local emergency room and had a chest CT scan 06/06/2015 which showed a nodule in the left breast measuring 0.9 cm and questionable left axillary adenopathy. She then proceeded to bilateral diagnostic mammography and left breast ultrasonography 06/19/2015. There were no prior films for comparison (last mammography 12 years prior).. The breast density was category C. Mammography showed in the left breast upper inner quadrant a 7 cm area including a small mass and significant pleomorphic calcifications. Ultrasonography defined the mass as measuring 1.2 cm. The left axilla appeared unremarkable. There was significant skin edema.  Biopsy of the left breast mass 06/19/2015 showed (SP [id number]) an invasive ductal carcinoma, grade 2, estrogen receptor 83% positive, progesterone receptor 26% positive, and HER-2 amplified by immunohistochemstry with a 3+ reading. The  patient had biopsies of a separate area in the left breast August of the same year and this showed atypical ductal hyperplasia. (SP [id number]).  Accordingly after appropriate discussion on 08/21/2015 the patient proceeded to left mastectomy with left axillary sentinel  lymph node sampling, which, since the lymph nodes were positive, extended to the procedure to left axillary lymph node dissection. The pathology (SP [id number]) showed an invasive ductal carcinoma, grade 3, measuring in excess of 9 cm. There were also skin satellites, not contiguous with the invasive carcinoma. Margins were clear and ample. There was evidence of lymphovascular invasion. A total of 15 lymph nodes were removed, including 5 sentinel lymph nodes, all of which were positive, so that the final total was 14 out of 15 lymph nodes involved by tumor. There was evidence of extranodal extension. The final pathology was pT4b pN2a, stage IIIB  CA-27-29 and CEA 09/19/2015 were non-informative October 2016.  Unfortunately CT scans of the chest abdomen and pelvis 09/16/2015 showed bony metastases to the ri/ght scapula, left iliac crest, and also L4 and T-spine. There were questionable liver cysts which on repeat CT scan 03/02/2016 appear to be a little bit more well-defined, possibly a little larger. There were also some possible right upper lobe lung lesions.  Adjuvant treatment consisted of docetaxel, trastuzumab and pertuzumab, with the final (6th) docetaxel dose given 02/11/2016. She continues on trastuzumab and pertuzumab, with the 11th cycle given 05/05/2016. Echocardiogram 02/26/2016 showed an ejection fraction of 55%. She receives denosumab/Xgeva every 4 weeks.. She also receives radiation, started 06/09/201, to be completed 06/26/2016.  Her subsequent history is as detailed below   PAST MEDICAL HISTORY: Past Medical History:  Diagnosis Date  . Alcohol abuse    none since 2013  . Anemia    during chemo  . Anxiety    At age 36  . Arthritis Dx 2010  . Bipolar disorder ([place])   . Bronchitis   . Cancer ([place])    breast mets to brain  . Chronic pain    resolved per patient 12/25/19  . Complication of anesthesia   . Depression   . Family history of adverse reaction to anesthesia    MOther  had PONV  . GERD (gastroesophageal reflux disease)   . Headache    hx  migraines  . Lymphedema of left arm   . Opiate dependence ([place])   . PONV (postoperative nausea and vomiting)   . Port-A-Cath in place   . PTSD (post-traumatic stress disorder)   . S/P endometrial ablation    in md's office    PAST SURGICAL HISTORY: Past Surgical History:  Procedure Laterality Date  . APPLICATION OF CRANIAL NAVIGATION N/A 08/14/2016   Procedure: APPLICATION OF CRANIAL NAVIGATION;  Surgeon: Shannon Levine, MD;  Location: [facility];  Service: Neurosurgery;  Laterality: N/A;  . BREAST RECONSTRUCTION Left    with silicone implant  . COLONOSCOPY W/ POLYPECTOMY    . CRANIOTOMY N/A 08/14/2016   Procedure: CRANIOTOMY TUMOR EXCISION WITH Lucky Rathke;  Surgeon: Shannon Levine, MD;  Location: [facility];  Service: Neurosurgery;  Laterality: N/A;  . FIBULA FRACTURE SURGERY Left   . MASTECTOMY Left   . RADIOLOGY WITH ANESTHESIA N/A 07/23/2016   Procedure: MRI OF BRAIN WITH AND WITHOUT;  Surgeon: Medication Radiologist, MD;  Location: [facility];  Service: Radiology;  Laterality: N/A;  . RADIOLOGY WITH ANESTHESIA N/A 09/08/2016   Procedure: MRI OF BRAIN WITH AND WITHOUT CONTRAST;  Surgeon: Medication Radiologist, MD;  Location: [facility];  Service: Radiology;  Laterality: N/A;  . RADIOLOGY WITH ANESTHESIA N/A 12/10/2016   Procedure: MRI OF BRAIN WITH AND WITHOUT;  Surgeon: Medication Radiologist, MD;  Location: [facility];  Service: Radiology;  Laterality: N/A;  . RADIOLOGY WITH ANESTHESIA N/A 03/02/2017   Procedure: MRI of BRAIN W and W/OUT CONTRAST;  Surgeon: Medication Radiologist, MD;  Location: [facility];  Service: Radiology;  Laterality: N/A;  . RADIOLOGY WITH ANESTHESIA N/A 07/29/2017   Procedure: RADIOLOGY WITH ANESTHESIA MRI OF BRAIN WITH AND WITHOUT CONTRAST;  Surgeon: Radiologist, Medication, MD;  Location: [facility];  Service: Radiology;  Laterality: N/A;  . RADIOLOGY WITH ANESTHESIA N/A 12/07/2017   Procedure: MRI WITH  ANESTHESIA OF BRAIN WITH AND WITHOUT CONTRAST;  Surgeon: Radiologist, Medication, MD;  Location: [facility];  Service: Radiology;  Laterality: N/A;  . RADIOLOGY WITH ANESTHESIA N/A 04/07/2018   Procedure: MRI OF BRAIN WITH AND WITHOUT CONTRAST;  Surgeon: Radiologist, Medication, MD;  Location: [facility];  Service: Radiology;  Laterality: N/A;  . RADIOLOGY WITH ANESTHESIA N/A 08/23/2018   Procedure: MRI WITH ANESTHESIA OF THE BRAIN WITH AND WITHOUT;  Surgeon: Radiologist, Medication, MD;  Location: [facility];  Service: Radiology;  Laterality: N/A;  . RADIOLOGY WITH ANESTHESIA N/A 01/24/2019   Procedure: MRI OF BRAIN WITH AND WITHOUT CONTRAST;  Surgeon: Radiologist, Medication, MD;  Location: [facility];  Service: Radiology;  Laterality: N/A;  . RADIOLOGY WITH ANESTHESIA N/A 07/06/2019   Procedure: MRI WITH ANESTHESIA OF BRAIN WITH AND WITHOUT CONTRAST;  Surgeon: Radiologist, Medication, MD;  Location: [facility];  Service: Radiology;  Laterality: N/A;  . RADIOLOGY WITH ANESTHESIA N/A 01/11/2020   Procedure: MRI WITH ANESTHESIA BRAIN WITH AND WITHOUT;  Surgeon: Radiologist, Medication, MD;  Location: [facility];  Service: Radiology;  Laterality: N/A;  . RADIOLOGY WITH ANESTHESIA N/A 05/21/2020   Procedure: MRI WITH ANESTHESIA OF BRAIN WITH AND WITHOUT CONTRAST;  Surgeon: Radiologist, Medication, MD;  Location: [facility];  Service: Radiology;  Laterality: N/A;  . right power port placement Right     FAMILY HISTORY Family History  Problem Relation Age of Onset  . Diabetes Mother   . Bipolar disorder Mother   . CAD Father    The patient's father still living, age 45, in [place]. He had prostate cancer at some point in the past. The patient's mother died at age 100 from complications of diabetes. The patient had no brothers, 2 sisters. A paternal grandmother had lung cancer in the setting of tobacco abuse. There is no other history of cancer in the family to her knowledge   GYNECOLOGIC HISTORY:  No LMP recorded. Patient has had  an ablation. Menarche approximately age 39. First live birth in 59. The patient is GX P2. She underwent endometrial ablation in 2016.   SOCIAL HISTORY: Updated August 2019 The patient is originally from [place]. She has lived in [place] before but more recently was in [place]. She is back here because she could not afford her rent in [place]. She is divorced. Her 2 children are [name] who lives in [place] and works as a Development worker, community, and [name] who also lives in [place] and works as a Catering manager. The patient has a grandchild, Arelia Longest, 57 years old as of July 2019, living in [place] with his mother. The patient also has a grandson born in February 2019, who lives in [place]. The patient has not established herself with a local church yet.    ADVANCED DIRECTIVES: Not in place; at the 06/03/2016 visit the patient was given the  appropriate forms to complete and notarize at her discretion   HEALTH MAINTENANCE: Social History   Tobacco Use  . Smoking status: Former Smoker    Packs/day: 1.00    Years: 43.00    Pack years: 43.00    Types: Cigarettes  . Smokeless tobacco: Never Used  . Tobacco comment: 3 day since last cigarette  Vaping Use  . Vaping Use: Some days  Substance Use Topics  . Alcohol use: No    Comment: no ETOH since 08/22/12  . Drug use: No    Comment: states she's in recovery program for 7 years     Colonoscopy:  PAP:  Bone density:   Allergies  Allergen Reactions  . Demerol [Meperidine Hcl] Itching and Nausea And Vomiting    INTOLERANCE >  N & V  . Erythromycin Rash    Current Outpatient Medications on File Prior to Visit  Medication Sig Dispense Refill  . acetaminophen (TYLENOL) 500 MG tablet Take 1,000 mg by mouth every 6 (six) hours as needed for moderate pain.    [name] anastrozole (ARIMIDEX) 1 MG tablet TAKE 1 TABLET BY MOUTH EVERY DAY 90 tablet 1  . azelastine (ASTELIN) 0.1 % nasal spray Place 2 sprays into both nostrils 2 (two) times daily.  Use in each nostril as directed 30 mL 12  . carvedilol (COREG) 3.125 MG tablet TAKE 1 TABLET (3.125 MG TOTAL) BY MOUTH 2 TIMES DAILY FOR 10 DAYS, THEN 2 TABLETS 2 TIMES DAILY. 120 tablet 3  . Cholecalciferol (VITAMIN D3) 250 MCG (10000 UT) capsule Take 10,000 Units by mouth daily.     . cyclobenzaprine (FLEXERIL) 10 MG tablet Take 1 tablet (10 mg total) by mouth 2 (two) times daily. 60 tablet 1  . cycloSPORINE (RESTASIS) 0.05 % ophthalmic emulsion Place 1 drop into both eyes 2 (two) times daily as needed (dry eyes).     [name] dexamethasone (DECADRON) 4 MG tablet Take 1 tablet (4 mg total) by mouth 2 (two) times daily. 10 tablet 0  . fluticasone (FLONASE) 50 MCG/ACT nasal spray SPRAY 2 SPRAYS INTO EACH NOSTRIL EVERY DAY 16 mL 2  . gabapentin (NEURONTIN) 300 MG capsule Take 1-2 capsules (300-600 mg total) by mouth See admin instructions. Take 300 mg by mouth in the morning and take 600 mg by mouth at bedtime 90 capsule 0  . ibuprofen (ADVIL) 800 MG tablet TAKE 0.5-1 TABLETS (400-800 MG TOTAL) BY MOUTH 2 (TWO) TIMES DAILY AS NEEDED. TAKE WITH FOOD. 90 tablet 0  . lamoTRIgine (LAMICTAL) 100 MG tablet Take 100 mg by mouth every morning.    . loratadine (CLARITIN) 10 MG tablet Take 1 tablet (10 mg total) by mouth daily. 90 tablet 3  . Lurasidone HCl (LATUDA) 60 MG TABS Take 60 mg by mouth daily.    . metroNIDAZOLE (FLAGYL) 500 MG tablet Take 4 tablets today 4 tablet 0  . ondansetron (ZOFRAN) 8 MG tablet TAKE 1 TABLET (8 MG TOTAL) BY MOUTH EVERY 8 (EIGHT) HOURS AS NEEDED FOR NAUSEA OR VOMITING. 90 tablet 1  . pantoprazole (PROTONIX) 40 MG tablet Take 1 tablet (40 mg total) by mouth daily. 90 tablet 3  . polyethylene glycol (MIRALAX) 17 g packet Take 17 g by mouth daily as needed for mild constipation. (Patient not taking: Reported on 10/21/2020)    . prochlorperazine (COMPAZINE) 10 MG tablet Take 1 tablet (10 mg total) by mouth every 6 (six) hours as needed for nausea or vomiting. 30 tablet 1  . rosuvastatin  (CRESTOR)  10 MG tablet Take 1 tablet (10 mg total) by mouth daily. 90 tablet 3  . traZODone (DESYREL) 100 MG tablet Take 1 tablet (100 mg total) by mouth at bedtime. (Patient taking differently: Take 300 mg by mouth at bedtime. ) 30 tablet 0   No current facility-administered medications on file prior to visit.    OBJECTIVE:  Vitals:   10/21/20 0900  BP: 101/69  Pulse: 70  Resp: 18  Temp: 97.7 F (36.5 C)  TempSrc: Tympanic  SpO2: 96%  Weight: 152 lb 3.2 oz (69 kg)  Height: 5' 5"  (1.651 m)  Body mass index is 25.33 kg/m.  ECOG FS: 2  GENERAL: Patient is a well appearing female in no acute distress HEENT:  Sclerae anicteric.  Mask in place. Neck is supple.  NODES:  No cervical, supraclavicular, or axillary lymphadenopathy palpated.  BREAST EXAM: Declined exam today LUNGS:  Clear to auscultation bilaterally.  No wheezes or rhonchi. HEART:  Regular rate and rhythm. No murmur appreciated. ABDOMEN:  Soft, nontender.  Positive, normoactive bowel sounds. No organomegaly palpated. MSK:  No focal spinal tenderness to palpation. EXTREMITIES: + left arm lymphedema present and stable SKIN:  Clear with no obvious rashes or skin changes. No nail dyscrasia. NEURO:  Nonfocal. Well oriented.  Appropriate affect.    LAB RESULTS: No results found for: LABCA2  CBC    Component Value Date/Time   WBC 4.6 10/21/2020 0840   RBC 3.36 (L) 10/21/2020 0840   HGB 10.7 (L) 10/21/2020 0840   HGB 13.5 07/05/2019 1129   HGB 12.1 10/26/2017 1038   HCT 32.4 (L) 10/21/2020 0840   HCT 35.7 10/26/2017 1038   PLT 69 (L) 10/21/2020 0840   PLT 54 (L) 07/05/2019 1129   PLT 167 10/26/2017 1038   MCV 96.4 10/21/2020 0840   MCV 98.4 10/26/2017 1038   MCH 31.8 10/21/2020 0840   MCHC 33.0 10/21/2020 0840   RDW 13.4 10/21/2020 0840   RDW 13.3 10/26/2017 1038   LYMPHSABS 1.1 10/21/2020 0840   LYMPHSABS 0.5 (L) 10/26/2017 1038   MONOABS 0.4 10/21/2020 0840   MONOABS 0.1 10/26/2017 1038   EOSABS 0.0  10/21/2020 0840   EOSABS 0.0 10/26/2017 1038   BASOSABS 0.0 10/21/2020 0840   BASOSABS 0.0 10/26/2017 1038   CMP Latest Ref Rng & Units 10/16/2020 10/02/2020 09/11/2020  Glucose 70 - 99 mg/dL - 100(H) 88  BUN 6 - 20 mg/dL - 9 16  Creatinine 0.44 - 1.00 mg/dL - 0.98 1.16(H)  Sodium 135 - 145 mmol/L - 142 139  Potassium 3.5 - 5.1 mmol/L - 4.2 4.4  Chloride 98 - 111 mmol/L - 106 101  CO2 22 - 32 mmol/L - 29 34(H)  Calcium 8.9 - 10.3 mg/dL - 8.8(L) 9.9  Total Protein 6.5 - 8.1 g/dL 6.1(L) 6.6 6.5  Total Bilirubin 0.3 - 1.2 mg/dL 0.2(L) 0.4 0.3  Alkaline Phos 38 - 126 U/L 43 58 60  AST 15 - 41 U/L 20 23 22   ALT 0 - 44 U/L 23 18 18      STUDIES: No results found.  ELIGIBLE FOR AVAILABLE RESEARCH PROTOCOL: no  ASSESSMENT: 59 y.o. [place] woman with stage IV left-sided breast cancer involving bone and central nervous system  (1) s/p left breast lower inner quadrant biopsy 06/19/2015 for a clinical T2-3 NX invasive ductal carcinoma, grade 2, triple positive.  (2) status post left mastectomy and axillary lymph node dissection  with immediate expander placement 07/18/2015 for an mpT4 pN2,stage IIIB invasive ductal  carcinoma, grade 3, with negative margins.  (a) definitive implant exchange to be scheduled in December   METASTATIC DISEASE: October 2016  (3) CT scan of the chest abdomen and pelvis  09/16/2015 shows metastatic lesions in the right scapula, left iliac crest, L4, and T spine. There were questionable liver cysts, with repeat CT scan 03/02/2016 showing possible right upper lobe lung lesions and possibly increased liver lesions  (a) CT scan of the chest 06/17/2016 shows no active disease in the lungs or liver  (b) Bone scan July 2017 showed no evidence of bony metastatic disease   (c) head CT 07/08/2016 showed a cerebellar lesion, confirmed by MRI 07/23/2016, status post craniotomy 08/14/2016, confirming a metastatic deposit which was estrogen and progesterone receptor negative,  HER-2 amplified with a signals ratio of 7.16, number per cell 13.25  (d) CA 27-29 is not informative  (4) received docetaxel every 3 weeks 6 together with trastuzumab and pertuzumab, last docetaxel dose 02/11/2016  (5) adjuvant radiation7/03/2016 to 06/26/2016 at [facility]: 1. The Left chest wall was treated to 23.4 Gy in 13 fractions at 1.8 Gy per fraction. 2. The Left chest wall was boosted to 10 Gy in 5 fractions at 2 Gy per fraction. 3. The Left Sclav/PAB was treated to 23.4 Gy in 13 fractions at 1.8 Gy per fraction.  [Note: Including the patient's treatment in [facility] (received 15 fractions per Dr. Maryan Rued near [place], [place]), the patient received 50.4 Gy to the left chest wall and supraclavicular region. ]  (6) started trastuzumab and pertuzumab October 2016, continuing every 3 weeks,  (a) echocardiogram 02/26/2016 showed a well preserved ejection fraction  (b) echocardiogram 07/01/2016 shows an ejection fraction in the 60-65%   (c) pertuzumab discontinued 10/2016 with uncontrolled diarrhea  (d) echocardiogram 11/11/2016 showed an ejection fraction in the 60-65%  (e) echocardiogram 03/03/2017 shows an ejection fraction of 60-65%  (f) echocardiogram on 05/19/2017 shows an ejection fraction of 55-60%  (g) echocardiogram 09/24/2017 shows the ejection fraction in the 60-65%  (h) echocardiogram 02/14/2018 shows an ejection fraction in the 60-65%  (I) echocardiogram  06/30/2018 shows an ejection fraction in the 55-60%  (m) echocardiogram on 12/08/2018 shows an ejection fraction in the 60-65% range  (7) started denosumab/Xgeva October 2017 given every 4 weeks, transitioned to every 8 weeks beginning 10/11/18 (every 6 weeks while giving TDM1 every 3 weeks)  (8) started anastrozole October 2017   (a) bone scan 11/10/2016 shows no active disease  (b) chest CT scan 11/10/2016 stable, with no evidence of active disease  (c) chest CT and bone scan 07/02/2017 show no evidence of active  disease  (d) CT scan of the chest with contrast 11/10/2017 shows some left axillary edema, but no evidence of thrombosis or adenopathy in that area, 0.9 cm precarinal lymph and 0.7 cm right upper lobe nodule node; bone lesions stable  (e) CT of the chest 05/04/2018 shows a 1.4 cm right lower paratracheal node which is slightly increased and a new right prevascular mediastinal node measuring 0.7 cm.  Bone lesions are stable.  (f) chest CT on 07/01/2018 shows no definite findings of metastatic disease in the thorax. Previously noted borderline enlarged low right paratracheal lymph node is stable to slightly decreased in size   (e) chest CT on 12/30/2018 notes mild increase in right paratracheal adneopathy, recommended PET scan.  Pet scan on 01/19/2019 showed a hypermetabolic and enlarged right paratracheal lymph node consistent with breast cancer recurrence.  (f)Trastuzumab discontinued due to February 2020 scans   (  g) TDM-1 started on 01/31/2019 given every 21 days.  (h) Chest CT 05/15/2019 shows decrease in mediastinal adenopathy  (i) CT chest on 08/17/2019 shows resolution of thoracic adenopathy  (j) TDM 1 discontinued after 10/11/2019 dose (8 months treatment) because of thrombocytopenia  (k) trastuzumab resumed 11/01/2019, repeated every 21 days  (l) echocardiogram 12/06/2019 showed an ejection fraction in the 60-65% range  (m) chest CT scan, brain CT and bone scan 01/31/2020 show no evidence of active disease  (9) history of bipolar disorder  (a) currently on Lamictal and Latuda as well as Desiree L and Neurontin  (10) mild anemia with a significant drop in the MCV, ferritin 10 06/03/2016,   (a) Feraheme given 06/12/2016 and 06/18/2016  (11) tobacco abuse: Chantix started 06/18/2016--she is not currently trying to quit   (12) 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  (a) brain irradiation10/19/17-11/08/17: Whole brain/ 35 Gy in 14 fractions   (b) repeat  brain MRI obtained 12/10/2016 shows no active disease in the brain  (c) repeat brain MRI 03/02/2017 shows no evidence of residual or recurrent disease  (d) repeat brain MRI 07/29/2017 shows no evidence of residual or recurrent disease  (e) repeat brain MRI 12/07/2017 shows no evidence of disease recurrence.  There is progressive white matter change secondary to prior treatment.  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  She continues on Trastuzumab every 3 weeks with good tolerance.  Her labs so far are stable.    She is following closely with Dr. Aundra Dubin regarding her heart function.  She sees him again in 12/2020.    [facility] is due for restaging brain MRI in December.  She requires anesthesia for her MRI, so I added a MRI total spine for her to have completed considering the persistence of her back pain.  I asked that they schedule both of these.    I have ensured that she can pick up her miralax from [facility].  I encouraged her to get up and down more to stay active so she won't get weaker.  She understands this.  She will return in 3 weeks for her next treatment and in 6 weeks for labs, f/u with Dr. [name] and Trastuzumab.  She knows to call for any questions that may arise between now and her next appointment.  We are happy to see her sooner if needed.  Total encounter time 20 minutes.Wilber Bihari, NP 10/21/20 9:17 AM Medical Oncology and Hematology [facility] [place], [place] [address] Tel. [phone]    Fax. [phone]   *Total Encounter Time as  defined by the Centers for Medicare and Medicaid Services includes, in addition to the face-to-face time of a patient visit (documented in the note above) non-face-to-face time: obtaining and reviewing outside history, ordering and reviewing medications, tests or procedures, care coordination (communications with other health care professionals or caregivers) and documentation in the medical record.

## 2020-10-21 NOTE — Patient Instructions (Signed)

## 2020-10-21 NOTE — Telephone Encounter (Signed)
Scheduled appts per 11/22 los. Gave pt a print out of AVS.  

## 2020-10-22 ENCOUNTER — Telehealth: Payer: Self-pay

## 2020-10-22 ENCOUNTER — Inpatient Hospital Stay: Payer: Medicaid Other

## 2020-10-22 ENCOUNTER — Other Ambulatory Visit: Payer: Self-pay | Admitting: Oncology

## 2020-10-22 ENCOUNTER — Ambulatory Visit: Payer: Medicaid Other | Admitting: Family Medicine

## 2020-10-22 VITALS — BP 108/69 | HR 72 | Temp 98.2°F | Resp 18

## 2020-10-22 DIAGNOSIS — C50119 Malignant neoplasm of central portion of unspecified female breast: Secondary | ICD-10-CM

## 2020-10-22 DIAGNOSIS — Z5112 Encounter for antineoplastic immunotherapy: Secondary | ICD-10-CM | POA: Diagnosis not present

## 2020-10-22 DIAGNOSIS — D696 Thrombocytopenia, unspecified: Secondary | ICD-10-CM

## 2020-10-22 DIAGNOSIS — C50312 Malignant neoplasm of lower-inner quadrant of left female breast: Secondary | ICD-10-CM

## 2020-10-22 DIAGNOSIS — C770 Secondary and unspecified malignant neoplasm of lymph nodes of head, face and neck: Secondary | ICD-10-CM

## 2020-10-22 MED ORDER — OXYCODONE HCL 5 MG PO TABS
ORAL_TABLET | ORAL | Status: AC
  Filled 2020-10-22: qty 1

## 2020-10-22 MED ORDER — TRASTUZUMAB-DKST CHEMO 150 MG IV SOLR
6.0000 mg/kg | Freq: Once | INTRAVENOUS | Status: AC
  Administered 2020-10-22: 399 mg via INTRAVENOUS
  Filled 2020-10-22: qty 19

## 2020-10-22 MED ORDER — OXYCODONE HCL 5 MG PO TABS
5.0000 mg | ORAL_TABLET | Freq: Once | ORAL | Status: AC
  Administered 2020-10-22: 5 mg via ORAL

## 2020-10-22 MED ORDER — ACETAMINOPHEN 325 MG PO TABS
ORAL_TABLET | ORAL | Status: AC
  Filled 2020-10-22: qty 1

## 2020-10-22 MED ORDER — HEPARIN SOD (PORK) LOCK FLUSH 100 UNIT/ML IV SOLN
500.0000 [IU] | Freq: Once | INTRAVENOUS | Status: AC | PRN
  Administered 2020-10-22: 500 [IU]
  Filled 2020-10-22: qty 5

## 2020-10-22 MED ORDER — SODIUM CHLORIDE 0.9% FLUSH
10.0000 mL | INTRAVENOUS | Status: DC | PRN
  Administered 2020-10-22: 10 mL
  Filled 2020-10-22: qty 10

## 2020-10-22 MED ORDER — ACETAMINOPHEN 325 MG PO TABS
650.0000 mg | ORAL_TABLET | Freq: Once | ORAL | Status: AC
  Administered 2020-10-22: 650 mg via ORAL

## 2020-10-22 MED ORDER — SODIUM CHLORIDE 0.9 % IV SOLN
Freq: Once | INTRAVENOUS | Status: AC
  Filled 2020-10-22: qty 250

## 2020-10-22 NOTE — Progress Notes (Signed)
Per Dr. [name], hold [name] today due to patient having tooth extracted on 08/27/20.

## 2020-10-22 NOTE — Patient Instructions (Signed)
[facility] Discharge Instructions for Patients Receiving Chemotherapy  Today you received the following chemotherapy agents: Ogivri   To help prevent nausea and vomiting after your treatment, we encourage you to take your nausea medication as directed.    If you develop nausea and vomiting that is not controlled by your nausea medication, call the clinic.   BELOW ARE SYMPTOMS THAT SHOULD BE REPORTED IMMEDIATELY:  *FEVER GREATER THAN 100.5 F  *CHILLS WITH OR WITHOUT FEVER  NAUSEA AND VOMITING THAT IS NOT CONTROLLED WITH YOUR NAUSEA MEDICATION  *UNUSUAL SHORTNESS OF BREATH  *UNUSUAL BRUISING OR BLEEDING  TENDERNESS IN MOUTH AND THROAT WITH OR WITHOUT PRESENCE OF ULCERS  *URINARY PROBLEMS  *BOWEL PROBLEMS  UNUSUAL RASH Items with * indicate a potential emergency and should be followed up as soon as possible.  Feel free to call the clinic should you have any questions or concerns. The clinic phone number is [phone].  Please show the CHEMO ALERT CARD at check-in to the Emergency Department and triage nurse.   

## 2020-10-22 NOTE — Telephone Encounter (Signed)
Dr. [name], Sending this to you as she is your patient and this went to [facility] for refill Gardiner Rhyme, RN

## 2020-10-23 ENCOUNTER — Other Ambulatory Visit: Payer: Medicaid Other

## 2020-10-23 ENCOUNTER — Ambulatory Visit: Payer: Medicaid Other

## 2020-10-23 ENCOUNTER — Ambulatory Visit: Payer: Medicaid Other | Admitting: Oncology

## 2020-10-28 ENCOUNTER — Telehealth: Payer: Self-pay

## 2020-10-29 ENCOUNTER — Other Ambulatory Visit: Payer: Self-pay | Admitting: Radiation Therapy

## 2020-11-04 ENCOUNTER — Other Ambulatory Visit: Payer: Self-pay | Admitting: Family Medicine

## 2020-11-04 NOTE — Telephone Encounter (Signed)
Requested medication (s) are due for refill today: no  Requested medication (s) are on the active medication list: yes   Last refill:10/15/2020  Future visit scheduled: no  Notes to clinic:  this refill cannot be delegated  Message left for patient to contact office for appts   Requested Prescriptions  Pending Prescriptions Disp Refills   cyclobenzaprine (FLEXERIL) 10 MG tablet [Pharmacy Med Name: CYCLOBENZAPRINE 10 MG TABLET] 60 tablet 1    Sig: TAKE 1 TABLET BY MOUTH TWICE A DAY      Not Delegated - Analgesics:  Muscle Relaxants Failed - 11/04/2020  7:57 AM      Failed - This refill cannot be delegated      Failed - Valid encounter within last 6 months    Recent Outpatient Visits           9 months ago Acute non-recurrent sinusitis of other sinus   [place], [place], MD   11 months ago Other constipation   [facility] Charlott Rakes, MD   1 year ago Metastatic breast cancer [facility])   [place], Enobong, MD   1 year ago Seasonal allergic rhinitis, unspecified trigger   [place], Patrick E, MD   2 years ago Bacterial vaginosis   [place], MD       Future Appointments             In 1 month Charlott Rakes, MD [place]

## 2020-11-12 ENCOUNTER — Inpatient Hospital Stay: Payer: Medicaid Other

## 2020-11-12 ENCOUNTER — Inpatient Hospital Stay: Payer: Medicaid Other | Attending: Medical

## 2020-11-12 ENCOUNTER — Other Ambulatory Visit: Payer: Self-pay

## 2020-11-12 VITALS — BP 103/54 | HR 70 | Temp 97.9°F | Resp 18 | Wt 153.8 lb

## 2020-11-12 DIAGNOSIS — F411 Generalized anxiety disorder: Secondary | ICD-10-CM

## 2020-11-12 DIAGNOSIS — C50119 Malignant neoplasm of central portion of unspecified female breast: Secondary | ICD-10-CM

## 2020-11-12 DIAGNOSIS — Z5112 Encounter for antineoplastic immunotherapy: Secondary | ICD-10-CM | POA: Diagnosis not present

## 2020-11-12 DIAGNOSIS — D696 Thrombocytopenia, unspecified: Secondary | ICD-10-CM

## 2020-11-12 DIAGNOSIS — C7951 Secondary malignant neoplasm of bone: Secondary | ICD-10-CM | POA: Diagnosis present

## 2020-11-12 DIAGNOSIS — Z95828 Presence of other vascular implants and grafts: Secondary | ICD-10-CM

## 2020-11-12 DIAGNOSIS — C50312 Malignant neoplasm of lower-inner quadrant of left female breast: Secondary | ICD-10-CM

## 2020-11-12 DIAGNOSIS — C770 Secondary and unspecified malignant neoplasm of lymph nodes of head, face and neck: Secondary | ICD-10-CM

## 2020-11-12 LAB — CBC WITH DIFFERENTIAL/PLATELET
Abs Immature Granulocytes: 0.02 K/uL (ref 0.00–0.07)
Basophils Absolute: 0 K/uL (ref 0.0–0.1)
Basophils Relative: 1 %
Eosinophils Absolute: 0 K/uL (ref 0.0–0.5)
Eosinophils Relative: 0 %
HCT: 30.8 % — ABNORMAL LOW (ref 36.0–46.0)
Hemoglobin: 10.3 g/dL — ABNORMAL LOW (ref 12.0–15.0)
Immature Granulocytes: 1 %
Lymphocytes Relative: 30 %
Lymphs Abs: 1.1 K/uL (ref 0.7–4.0)
MCH: 31.8 pg (ref 26.0–34.0)
MCHC: 33.4 g/dL (ref 30.0–36.0)
MCV: 95.1 fL (ref 80.0–100.0)
Monocytes Absolute: 0.4 K/uL (ref 0.1–1.0)
Monocytes Relative: 11 %
Neutro Abs: 2.1 K/uL (ref 1.7–7.7)
Neutrophils Relative %: 57 %
Platelets: 88 K/uL — ABNORMAL LOW (ref 150–400)
RBC: 3.24 MIL/uL — ABNORMAL LOW (ref 3.87–5.11)
RDW: 12.5 % (ref 11.5–15.5)
WBC: 3.5 K/uL — ABNORMAL LOW (ref 4.0–10.5)
nRBC: 0 % (ref 0.0–0.2)

## 2020-11-12 LAB — COMPREHENSIVE METABOLIC PANEL
ALT: 20 U/L (ref 0–44)
AST: 26 U/L (ref 15–41)
Albumin: 3.6 g/dL (ref 3.5–5.0)
Alkaline Phosphatase: 58 U/L (ref 38–126)
Anion gap: 8 (ref 5–15)
BUN: 10 mg/dL (ref 6–20)
CO2: 26 mmol/L (ref 22–32)
Calcium: 8.9 mg/dL (ref 8.9–10.3)
Chloride: 106 mmol/L (ref 98–111)
Creatinine, Ser: 0.99 mg/dL (ref 0.44–1.00)
GFR, Estimated: >60 mL/min (ref >60)
Glucose, Bld: 97 mg/dL (ref 70–99)
Potassium: 4 mmol/L (ref 3.5–5.1)
Sodium: 140 mmol/L (ref 135–145)
Total Bilirubin: 0.4 mg/dL (ref 0.3–1.2)
Total Protein: 6.3 g/dL — ABNORMAL LOW (ref 6.5–8.1)

## 2020-11-12 MED ORDER — SODIUM CHLORIDE 0.9% FLUSH
10.0000 mL | INTRAVENOUS | Status: DC | PRN
  Administered 2020-11-12: 10 mL
  Filled 2020-11-12: qty 10

## 2020-11-12 MED ORDER — ACETAMINOPHEN 325 MG PO TABS
650.0000 mg | ORAL_TABLET | Freq: Once | ORAL | Status: AC
  Administered 2020-11-12: 650 mg via ORAL

## 2020-11-12 MED ORDER — HEPARIN SOD (PORK) LOCK FLUSH 100 UNIT/ML IV SOLN
500.0000 [IU] | Freq: Once | INTRAVENOUS | Status: AC | PRN
  Administered 2020-11-12: 500 [IU]
  Filled 2020-11-12: qty 5

## 2020-11-12 MED ORDER — ACETAMINOPHEN 325 MG PO TABS
ORAL_TABLET | ORAL | Status: AC
  Filled 2020-11-12: qty 2

## 2020-11-12 MED ORDER — TRASTUZUMAB-DKST CHEMO 150 MG IV SOLR
6.0000 mg/kg | Freq: Once | INTRAVENOUS | Status: AC
  Administered 2020-11-12: 399 mg via INTRAVENOUS
  Filled 2020-11-12: qty 19

## 2020-11-12 MED ORDER — SODIUM CHLORIDE 0.9% FLUSH
10.0000 mL | Freq: Once | INTRAVENOUS | Status: AC
  Administered 2020-11-12: 10 mL
  Filled 2020-11-12: qty 10

## 2020-11-12 MED ORDER — SODIUM CHLORIDE 0.9 % IV SOLN
Freq: Once | INTRAVENOUS | Status: AC
  Filled 2020-11-12: qty 250

## 2020-11-12 NOTE — Patient Instructions (Signed)
[facility] Discharge Instructions for Patients Receiving Chemotherapy  Today you received the following chemotherapy agents trastuzumab.  To help prevent nausea and vomiting after your treatment, we encourage you to take your nausea medication as directed.    If you develop nausea and vomiting that is not controlled by your nausea medication, call the clinic.   BELOW ARE SYMPTOMS THAT SHOULD BE REPORTED IMMEDIATELY:  *FEVER GREATER THAN 100.5 F  *CHILLS WITH OR WITHOUT FEVER  NAUSEA AND VOMITING THAT IS NOT CONTROLLED WITH YOUR NAUSEA MEDICATION  *UNUSUAL SHORTNESS OF BREATH  *UNUSUAL BRUISING OR BLEEDING  TENDERNESS IN MOUTH AND THROAT WITH OR WITHOUT PRESENCE OF ULCERS  *URINARY PROBLEMS  *BOWEL PROBLEMS  UNUSUAL RASH Items with * indicate a potential emergency and should be followed up as soon as possible.  Feel free to call the clinic should you have any questions or concerns. The clinic phone number is [phone].  Please show the CHEMO ALERT CARD at check-in to the Emergency Department and triage nurse.   

## 2020-11-25 ENCOUNTER — Encounter (HOSPITAL_COMMUNITY): Payer: Medicaid Other | Admitting: Cardiology

## 2020-11-25 ENCOUNTER — Other Ambulatory Visit (HOSPITAL_COMMUNITY): Payer: Medicaid Other

## 2020-11-26 ENCOUNTER — Ambulatory Visit (HOSPITAL_COMMUNITY): Payer: Medicaid Other

## 2020-11-26 ENCOUNTER — Other Ambulatory Visit (HOSPITAL_COMMUNITY): Payer: Medicaid Other

## 2020-11-27 ENCOUNTER — Other Ambulatory Visit: Payer: Self-pay | Admitting: Family Medicine

## 2020-11-27 NOTE — Telephone Encounter (Signed)
Requested medication (s) are due for refill today: Yes  Requested medication (s) are on the active medication list: Yes  Last refill:  09/19/20  Future visit scheduled: No  Notes to clinic:  See request.    Requested Prescriptions  Pending Prescriptions Disp Refills   cyclobenzaprine (FLEXERIL) 10 MG tablet [Pharmacy Med Name: CYCLOBENZAPRINE 10 MG TABLET] 60 tablet 1    Sig: TAKE 1 TABLET BY MOUTH TWICE A DAY      Not Delegated - Analgesics:  Muscle Relaxants Failed - 11/27/2020  7:49 AM      Failed - This refill cannot be delegated      Failed - Valid encounter within last 6 months    Recent Outpatient Visits           10 months ago Acute non-recurrent sinusitis of other sinus   [facility] Hoy Register, MD   1 year ago Other constipation   [facility] Hoy Register, MD   1 year ago Metastatic breast cancer [facility] [place])   [facility] Hoy Register, MD   1 year ago Seasonal allergic rhinitis, unspecified trigger   [facility] Storm Frisk, MD   2 years ago Bacterial vaginosis    [facility] H[facility], [facility]       Future Appointments             In 1 week Hoy Register, MD [facility]

## 2020-12-02 ENCOUNTER — Ambulatory Visit (HOSPITAL_COMMUNITY): Payer: Medicaid Other | Attending: Family Medicine

## 2020-12-02 NOTE — Progress Notes (Signed)
[facility]  [phone] [phone]     ID: [name] DOB: 28-Oct-1961  MR#: [id number]  DHR#:[id number]  Patient Care Team: Charlott Rakes, MD as PCP - General (Family Medicine) Larey Dresser, MD as PCP - Advanced Heart Failure (Cardiology) Kyung Rudd, MD as Consulting Physician (Radiation Oncology) Erline Levine, MD as Consulting Physician (Neurosurgery) Corena Pilgrim, MD as Consulting Physician (Psychiatry) Mickeal Skinner Acey Lav, MD as Consulting Physician (Psychiatry) Nehemiah Settle, MD as Referring Physician (Plastic Surgery) Irene Limbo, MD as Consulting Physician (Plastic Surgery) Dillingham, Loel Lofty, DO as Attending Physician (Plastic Surgery) Shaquita Fort, Virgie Dad, MD as Consulting Physician (Oncology)   CHIEF COMPLAINT: Metastatic triple positive breast cancer  CURRENT TREATMENT: trastuzumab [every 21 days], denosumab/Xgeva (Q6W), anastrozole   INTERVAL HISTORY: [name] was scheduled today for follow up and treatment of her metastatic triple positive breast cancer.  However she did not show for her visit  She continues on trastuzumab, given every 21 days.  We switched back to this for maintenance after she took the TDM 1 for 8 months. Her most recent echocardiogram on 08/20/2020 showed an EF of 55-60%. She is due for repeat echocardiogram 01/01/2020 and follow up with Dr. Aundra Dubin.  She also receives denosumab/Xgeva every 6 weeks and has no issues with taking this.  However this has been held due to a healing dental extraction. Her most recent dose was on 08/21/2020.  She continues on anastrozole with good tolerance.    She is scheduled for brain MRI and total spine screening on 12/05/2020 and to see Dr. Mickeal Skinner 12/10/2019   REVIEW OF SYSTEMS: [name]    COVID 19 VACCINATION STATUS:    BREAST CANCER HISTORY: From the original intake note:  Cadynce was aware of a "lemon sized lump in" her left axilla for about a year before  bringing it to medical attention. By then she had developed left breast and left axillary swelling (June 2016). She presented to the local emergency room and had a chest CT scan 06/06/2015 which showed a nodule in the left breast measuring 0.9 cm and questionable left axillary adenopathy. She then proceeded to bilateral diagnostic mammography and left breast ultrasonography 06/19/2015. There were no prior films for comparison (last mammography 12 years prior).. The breast density was category C. Mammography showed in the left breast upper inner quadrant a 7 cm area including a small mass and significant pleomorphic calcifications. Ultrasonography defined the mass as measuring 1.2 cm. The left axilla appeared unremarkable. There was significant skin edema.  Biopsy of the left breast mass 06/19/2015 showed (SP [id number]) an invasive ductal carcinoma, grade 2, estrogen receptor 83% positive, progesterone receptor 26% positive, and HER-2 amplified by immunohistochemstry with a 3+ reading. The patient had biopsies of a separate area in the left breast August of the same year and this showed atypical ductal hyperplasia. (SP [id number]).  Accordingly after appropriate discussion on 08/21/2015 the patient proceeded to left mastectomy with left axillary sentinel lymph node sampling, which, since the lymph nodes were positive, extended to the procedure to left axillary lymph node dissection. The pathology (SP [id number]) showed an invasive ductal carcinoma, grade 3, measuring in excess of 9 cm. There were also skin satellites, not contiguous with the invasive carcinoma. Margins were clear and ample. There was evidence of lymphovascular invasion. A total of 15 lymph nodes were removed, including 5 sentinel lymph nodes, all of which were positive, so that the final total was 14 out of 15 lymph nodes involved by tumor.  There was evidence of extranodal extension. The final pathology was pT4b pN2a, stage IIIB  CA-27-29 and CEA  09/19/2015 were non-informative October 2016.  Unfortunately CT scans of the chest abdomen and pelvis 09/16/2015 showed bony metastases to the ri/ght scapula, left iliac crest, and also L4 and T-spine. There were questionable liver cysts which on repeat CT scan 03/02/2016 appear to be a little bit more well-defined, possibly a little larger. There were also some possible right upper lobe lung lesions.  Adjuvant treatment consisted of docetaxel, trastuzumab and pertuzumab, with the final (6th) docetaxel dose given 02/11/2016. She continues on trastuzumab and pertuzumab, with the 11th cycle given 05/05/2016. Echocardiogram 02/26/2016 showed an ejection fraction of 55%. She receives denosumab/Xgeva every 4 weeks.. She also receives radiation, started 06/09/201, to be completed 06/26/2016.  Her subsequent history is as detailed below   PAST MEDICAL HISTORY: Past Medical History:  Diagnosis Date  . Alcohol abuse    none since 2013  . Anemia    during chemo  . Anxiety    At age 89  . Arthritis Dx 2010  . Bipolar disorder ([facility])   . Bronchitis   . Cancer ([place])    breast mets to brain  . Chronic pain    resolved per patient 12/25/19  . Complication of anesthesia   . Depression   . Family history of adverse reaction to anesthesia    MOther had PONV  . GERD (gastroesophageal reflux disease)   . Headache    hx  migraines  . Lymphedema of left arm   . Opiate dependence ([place])   . PONV (postoperative nausea and vomiting)   . Port-A-Cath in place   . PTSD (post-traumatic stress disorder)   . S/P endometrial ablation    in md's office    PAST SURGICAL HISTORY: Past Surgical History:  Procedure Laterality Date  . APPLICATION OF CRANIAL NAVIGATION N/A 08/14/2016   Procedure: APPLICATION OF CRANIAL NAVIGATION;  Surgeon: Erline Levine, MD;  Location: [facility];  Service: Neurosurgery;  Laterality: N/A;  . BREAST RECONSTRUCTION Left    with silicone implant  . COLONOSCOPY W/ POLYPECTOMY     . CRANIOTOMY N/A 08/14/2016   Procedure: CRANIOTOMY TUMOR EXCISION WITH Lucky Rathke;  Surgeon: Erline Levine, MD;  Location: [facility];  Service: Neurosurgery;  Laterality: N/A;  . FIBULA FRACTURE SURGERY Left   . MASTECTOMY Left   . RADIOLOGY WITH ANESTHESIA N/A 07/23/2016   Procedure: MRI OF BRAIN WITH AND WITHOUT;  Surgeon: Medication Radiologist, MD;  Location: [facility];  Service: Radiology;  Laterality: N/A;  . RADIOLOGY WITH ANESTHESIA N/A 09/08/2016   Procedure: MRI OF BRAIN WITH AND WITHOUT CONTRAST;  Surgeon: Medication Radiologist, MD;  Location: [facility];  Service: Radiology;  Laterality: N/A;  . RADIOLOGY WITH ANESTHESIA N/A 12/10/2016   Procedure: MRI OF BRAIN WITH AND WITHOUT;  Surgeon: Medication Radiologist, MD;  Location: [facility];  Service: Radiology;  Laterality: N/A;  . RADIOLOGY WITH ANESTHESIA N/A 03/02/2017   Procedure: MRI of BRAIN W and W/OUT CONTRAST;  Surgeon: Medication Radiologist, MD;  Location: [facility];  Service: Radiology;  Laterality: N/A;  . RADIOLOGY WITH ANESTHESIA N/A 07/29/2017   Procedure: RADIOLOGY WITH ANESTHESIA MRI OF BRAIN WITH AND WITHOUT CONTRAST;  Surgeon: Radiologist, Medication, MD;  Location: [facility];  Service: Radiology;  Laterality: N/A;  . RADIOLOGY WITH ANESTHESIA N/A 12/07/2017   Procedure: MRI WITH ANESTHESIA OF BRAIN WITH AND WITHOUT CONTRAST;  Surgeon: Radiologist, Medication, MD;  Location: [facility];  Service:  Radiology;  Laterality: N/A;  . RADIOLOGY WITH ANESTHESIA N/A 04/07/2018   Procedure: MRI OF BRAIN WITH AND WITHOUT CONTRAST;  Surgeon: Radiologist, Medication, MD;  Location: [facility];  Service: Radiology;  Laterality: N/A;  . RADIOLOGY WITH ANESTHESIA N/A 08/23/2018   Procedure: MRI WITH ANESTHESIA OF THE BRAIN WITH AND WITHOUT;  Surgeon: Radiologist, Medication, MD;  Location: [facility];  Service: Radiology;  Laterality: N/A;  . RADIOLOGY WITH ANESTHESIA N/A 01/24/2019   Procedure: MRI OF BRAIN WITH AND WITHOUT CONTRAST;  Surgeon: Radiologist, Medication, MD;   Location: [facility];  Service: Radiology;  Laterality: N/A;  . RADIOLOGY WITH ANESTHESIA N/A 07/06/2019   Procedure: MRI WITH ANESTHESIA OF BRAIN WITH AND WITHOUT CONTRAST;  Surgeon: Radiologist, Medication, MD;  Location: [facility];  Service: Radiology;  Laterality: N/A;  . RADIOLOGY WITH ANESTHESIA N/A 01/11/2020   Procedure: MRI WITH ANESTHESIA BRAIN WITH AND WITHOUT;  Surgeon: Radiologist, Medication, MD;  Location: [facility];  Service: Radiology;  Laterality: N/A;  . RADIOLOGY WITH ANESTHESIA N/A 05/21/2020   Procedure: MRI WITH ANESTHESIA OF BRAIN WITH AND WITHOUT CONTRAST;  Surgeon: Radiologist, Medication, MD;  Location: [facility];  Service: Radiology;  Laterality: N/A;  . right power port placement Right     FAMILY HISTORY Family History  Problem Relation Age of Onset  . Diabetes Mother   . Bipolar disorder Mother   . CAD Father    The patient's father still living, age 36, in [place]. He had prostate cancer at some point in the past. The patient's mother died at age 85 from complications of diabetes. The patient had no brothers, 2 sisters. A paternal grandmother had lung cancer in the setting of tobacco abuse. There is no other history of cancer in the family to her knowledge   GYNECOLOGIC HISTORY:  No LMP recorded. Patient has had an ablation. Menarche approximately age 32. First live birth in 35. The patient is GX P2. She underwent endometrial ablation in 2016.   SOCIAL HISTORY: Updated August 2019 The patient is originally from [place]. She has lived in [place] before but more recently was in [place]. She is back here because she could not afford her rent in [place]. She is divorced. Her 2 children are [name] who lives in [place] and works as a Development worker, community, and [name] who also lives in [place] and works as a Catering manager. The patient has a grandchild, Arelia Longest, 20 years old as of July 2019, living in [place] with his mother. The patient also has a grandson born in  February 2019, who lives in [place]. The patient has not established herself with a local church yet.    ADVANCED DIRECTIVES: Not in place; at the 06/03/2016 visit the patient was given the appropriate forms to complete and notarize at her discretion   HEALTH MAINTENANCE: Social History   Tobacco Use  . Smoking status: Former Smoker    Packs/day: 1.00    Years: 43.00    Pack years: 43.00    Types: Cigarettes  . Smokeless tobacco: Never Used  . Tobacco comment: 3 day since last cigarette  Vaping Use  . Vaping Use: Some days  Substance Use Topics  . Alcohol use: No    Comment: no ETOH since 08/22/12  . Drug use: No    Comment: states she's in recovery program for 7 years     Colonoscopy:  PAP:  Bone density:   Allergies  Allergen Reactions  . Demerol [Meperidine Hcl] Itching and Nausea And Vomiting  INTOLERANCE >  N & V  . Erythromycin Rash    Current Outpatient Medications on File Prior to Visit  Medication Sig Dispense Refill  . acetaminophen (TYLENOL) 500 MG tablet Take 1,000 mg by mouth every 6 (six) hours as needed for moderate pain.    [name] anastrozole (ARIMIDEX) 1 MG tablet TAKE 1 TABLET BY MOUTH EVERY DAY 90 tablet 1  . azelastine (ASTELIN) 0.1 % nasal spray Place 2 sprays into both nostrils 2 (two) times daily. Use in each nostril as directed 30 mL 12  . carvedilol (COREG) 3.125 MG tablet TAKE 1 TABLET (3.125 MG TOTAL) BY MOUTH 2 TIMES DAILY FOR 10 DAYS, THEN 2 TABLETS 2 TIMES DAILY. 120 tablet 3  . Cholecalciferol (VITAMIN D3) 250 MCG (10000 UT) capsule Take 10,000 Units by mouth daily.     . cyclobenzaprine (FLEXERIL) 10 MG tablet TAKE 1 TABLET BY MOUTH TWICE A DAY 60 tablet 0  . cycloSPORINE (RESTASIS) 0.05 % ophthalmic emulsion Place 1 drop into both eyes 2 (two) times daily as needed (dry eyes).     [name] dexamethasone (DECADRON) 4 MG tablet Take 1 tablet (4 mg total) by mouth 2 (two) times daily. 10 tablet 0  . fluticasone (FLONASE) 50 MCG/ACT nasal spray SPRAY 2  SPRAYS INTO EACH NOSTRIL EVERY DAY 16 mL 2  . gabapentin (NEURONTIN) 300 MG capsule Take 1-2 capsules (300-600 mg total) by mouth See admin instructions. Take 300 mg by mouth in the morning and take 600 mg by mouth at bedtime 90 capsule 0  . ibuprofen (ADVIL) 800 MG tablet TAKE 0.5-1 TABLETS (400-800 MG TOTAL) BY MOUTH 2 (TWO) TIMES DAILY AS NEEDED. TAKE WITH FOOD. 60 tablet 1  . lamoTRIgine (LAMICTAL) 100 MG tablet Take 100 mg by mouth every morning.    . loratadine (CLARITIN) 10 MG tablet Take 1 tablet (10 mg total) by mouth daily. 90 tablet 3  . Lurasidone HCl (LATUDA) 60 MG TABS Take 60 mg by mouth daily.    . metroNIDAZOLE (FLAGYL) 500 MG tablet Take 4 tablets today 4 tablet 0  . ondansetron (ZOFRAN) 8 MG tablet TAKE 1 TABLET (8 MG TOTAL) BY MOUTH EVERY 8 (EIGHT) HOURS AS NEEDED FOR NAUSEA OR VOMITING. 90 tablet 1  . pantoprazole (PROTONIX) 40 MG tablet Take 1 tablet (40 mg total) by mouth daily. 90 tablet 3  . polyethylene glycol (MIRALAX) 17 g packet Take 17 g by mouth daily as needed for mild constipation. (Patient not taking: Reported on 10/21/2020)    . prochlorperazine (COMPAZINE) 10 MG tablet Take 1 tablet (10 mg total) by mouth every 6 (six) hours as needed for nausea or vomiting. 30 tablet 1  . rosuvastatin (CRESTOR) 10 MG tablet Take 1 tablet (10 mg total) by mouth daily. 90 tablet 3  . traZODone (DESYREL) 100 MG tablet Take 1 tablet (100 mg total) by mouth at bedtime. (Patient taking differently: Take 300 mg by mouth at bedtime. ) 30 tablet 0   No current facility-administered medications on file prior to visit.    OBJECTIVE:  There were no vitals filed for this visit.There is no height or weight on file to calculate BMI.  ECOG FS: 2     LAB RESULTS: No results found for: LABCA2  CBC    Component Value Date/Time   WBC 3.5 (L) 11/12/2020 0840   RBC 3.24 (L) 11/12/2020 0840   HGB 10.3 (L) 11/12/2020 0840   HGB 13.5 07/05/2019 1129   HGB 12.1 10/26/2017 1038  HCT 30.8  (L) 11/12/2020 0840   HCT 35.7 10/26/2017 1038   PLT 88 (L) 11/12/2020 0840   PLT 54 (L) 07/05/2019 1129   PLT 167 10/26/2017 1038   MCV 95.1 11/12/2020 0840   MCV 98.4 10/26/2017 1038   MCH 31.8 11/12/2020 0840   MCHC 33.4 11/12/2020 0840   RDW 12.5 11/12/2020 0840   RDW 13.3 10/26/2017 1038   LYMPHSABS 1.1 11/12/2020 0840   LYMPHSABS 0.5 (L) 10/26/2017 1038   MONOABS 0.4 11/12/2020 0840   MONOABS 0.1 10/26/2017 1038   EOSABS 0.0 11/12/2020 0840   EOSABS 0.0 10/26/2017 1038   BASOSABS 0.0 11/12/2020 0840   BASOSABS 0.0 10/26/2017 1038   CMP Latest Ref Rng & Units 11/12/2020 10/21/2020 10/16/2020  Glucose 70 - 99 mg/dL 97 101(H) -  BUN 6 - 20 mg/dL 10 11 -  Creatinine 0.44 - 1.00 mg/dL 0.99 0.96 -  Sodium 135 - 145 mmol/L 140 139 -  Potassium 3.5 - 5.1 mmol/L 4.0 4.1 -  Chloride 98 - 111 mmol/L 106 103 -  CO2 22 - 32 mmol/L 26 31 -  Calcium 8.9 - 10.3 mg/dL 8.9 9.1 -  Total Protein 6.5 - 8.1 g/dL 6.3(L) 6.2(L) 6.1(L)  Total Bilirubin 0.3 - 1.2 mg/dL 0.4 0.4 0.2(L)  Alkaline Phos 38 - 126 U/L 58 48 43  AST 15 - 41 U/L 26 22 20   ALT 0 - 44 U/L 20 18 23      STUDIES: No results found.   ELIGIBLE FOR AVAILABLE RESEARCH PROTOCOL: no  ASSESSMENT: 60 y.o. 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  (2) status post left mastectomy and axillary lymph node dissection  with immediate expander placement 07/18/2015 for an mpT4 pN2,stage IIIB invasive ductal carcinoma, grade 3, with negative margins.  (a) definitive implant exchange to be scheduled in December   METASTATIC DISEASE: October 2016  (3) CT scan of the chest abdomen and pelvis  09/16/2015 shows metastatic lesions in the right scapula, left iliac crest, L4, and T spine. There were questionable liver cysts, with repeat CT scan  03/02/2016 showing possible right upper lobe lung lesions and possibly increased liver lesions  (a) CT scan of the chest 06/17/2016 shows no active disease in the lungs or liver  (b) Bone scan July 2017 showed no evidence of bony metastatic disease   (c) head CT 07/08/2016 showed a cerebellar lesion, confirmed by MRI 07/23/2016, status post craniotomy 08/14/2016, confirming a metastatic deposit which was estrogen and progesterone receptor negative, HER-2 amplified with a signals ratio of 7.16, number per cell 13.25  (d) CA 27-29 is not informative  (4) received docetaxel every 3 weeks 6 together with trastuzumab and pertuzumab, last docetaxel dose 02/11/2016  (5) adjuvant radiation7/03/2016 to 06/26/2016 at [facility]: 1. The Left chest wall was treated to 23.4 Gy in 13 fractions at 1.8 Gy per fraction. 2. The Left chest wall was boosted to 10 Gy in 5 fractions at 2 Gy per fraction. 3. The Left Sclav/PAB was treated to 23.4 Gy in 13 fractions at 1.8 Gy per fraction.  [Note: Including the patient's treatment in [place] (received 15 fractions per Dr. Maryan Rued near [place], [place]), the patient received 50.4 Gy to the left chest wall and supraclavicular region. ]  (6) started trastuzumab and pertuzumab October 2016, continuing every 3 weeks,  (  a) echocardiogram 02/26/2016 showed a well preserved ejection fraction  (b) echocardiogram 07/01/2016 shows an ejection fraction in the 60-65%   (c) pertuzumab discontinued 10/2016 with uncontrolled diarrhea  (d) echocardiogram 11/11/2016 showed an ejection fraction in the 60-65%  (e) echocardiogram 03/03/2017 shows an ejection fraction of 60-65%  (f) echocardiogram on 05/19/2017 shows an ejection fraction of 55-60%  (g) echocardiogram 09/24/2017 shows the ejection fraction in the 60-65%  (h) echocardiogram 02/14/2018 shows an ejection fraction in the 60-65%  (I) echocardiogram  06/30/2018 shows an ejection fraction in the 55-60%  (m) echocardiogram on  12/08/2018 shows an ejection fraction in the 60-65% range  (7) started denosumab/Xgeva October 2017 given every 4 weeks, transitioned to every 8 weeks beginning 10/11/18 (every 6 weeks while giving TDM1 every 3 weeks)  (8) started anastrozole October 2017   (a) bone scan 11/10/2016 shows no active disease  (b) chest CT scan 11/10/2016 stable, with no evidence of active disease  (c) chest CT and bone scan 07/02/2017 show no evidence of active disease  (d) CT scan of the chest with contrast 11/10/2017 shows some left axillary edema, but no evidence of thrombosis or adenopathy in that area, 0.9 cm precarinal lymph and 0.7 cm right upper lobe nodule node; bone lesions stable  (e) CT of the chest 05/04/2018 shows a 1.4 cm right lower paratracheal node which is slightly increased and a new right prevascular mediastinal node measuring 0.7 cm.  Bone lesions are stable.  (f) chest CT on 07/01/2018 shows no definite findings of metastatic disease in the thorax. Previously noted borderline enlarged low right paratracheal lymph node is stable to slightly decreased in size   (e) chest CT on 12/30/2018 notes mild increase in right paratracheal adneopathy, recommended PET scan.  Pet scan on 01/19/2019 showed a hypermetabolic and enlarged right paratracheal lymph node consistent with breast cancer recurrence.  (f)Trastuzumab discontinued due to February 2020 scans   (g) TDM-1 started on 01/31/2019 given every 21 days.  (h) Chest CT 05/15/2019 shows decrease in mediastinal adenopathy  (i) CT chest on 08/17/2019 shows resolution of thoracic adenopathy  (j) TDM 1 discontinued after 10/11/2019 dose (8 months treatment) because of thrombocytopenia  (k) trastuzumab resumed 11/01/2019, repeated every 21 days  (l) echocardiogram 12/06/2019 showed an ejection fraction in the 60-65% range  (m) chest CT scan, brain CT and bone scan 01/31/2020 show no evidence of active disease  (9) history of bipolar disorder  (a) currently on  Lamictal and Latuda as well as Desiree L and Neurontin  (10) mild anemia with a significant drop in the MCV, ferritin 10 06/03/2016,   (a) Feraheme given 06/12/2016 and 06/18/2016  (11) tobacco abuse: Chantix started 06/18/2016--she is not currently trying to quit   (12) 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  (a) brain irradiation10/19/17-11/08/17: Whole brain/ 35 Gy in 14 fractions   (b) repeat brain MRI obtained 12/10/2016 shows no active disease in the brain  (c) repeat brain MRI 03/02/2017 shows no evidence of residual or recurrent disease  (d) repeat brain MRI 07/29/2017 shows no evidence of residual or recurrent disease  (e) repeat brain MRI 12/07/2017 shows no evidence of disease recurrence.  There is progressive white matter change secondary to prior treatment.  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  She has MRI of the brain and visit with Dr. Mickeal Skinner 12/10/2019.  I am going ahead and moving her trastuzumab treatment to 12/10/2019 and we will follow up with her thereafter.  Virgie Dad. Latrell Potempa, MD 12/03/20 9:51 AM Medical Oncology and Hematology [facility] [place], [place] [address] Tel. [phone]    Fax. [phone]   I, Wilburn Mylar, am acting as scribe for Dr. Virgie Dad. Constance Whittle.  I, Lurline Del MD, have reviewed the above documentation for accuracy and completeness, and I agree with the above.    *Total Encounter Time as defined by the Centers for Medicare and Medicaid Services  includes, in addition to the face-to-face time of a patient visit (documented in the note above) non-face-to-face time: obtaining and reviewing outside history, ordering and reviewing medications, tests or procedures, care coordination (communications with other health care professionals or caregivers) and documentation in the medical record.

## 2020-12-03 ENCOUNTER — Inpatient Hospital Stay (HOSPITAL_BASED_OUTPATIENT_CLINIC_OR_DEPARTMENT_OTHER): Payer: Medicaid Other | Admitting: Oncology

## 2020-12-03 ENCOUNTER — Inpatient Hospital Stay: Payer: Medicaid Other | Attending: Medical

## 2020-12-03 ENCOUNTER — Telehealth: Payer: Self-pay | Admitting: *Deleted

## 2020-12-03 ENCOUNTER — Inpatient Hospital Stay: Payer: Medicaid Other

## 2020-12-03 DIAGNOSIS — Z79899 Other long term (current) drug therapy: Secondary | ICD-10-CM | POA: Insufficient documentation

## 2020-12-03 DIAGNOSIS — C7951 Secondary malignant neoplasm of bone: Secondary | ICD-10-CM | POA: Insufficient documentation

## 2020-12-03 DIAGNOSIS — C50312 Malignant neoplasm of lower-inner quadrant of left female breast: Secondary | ICD-10-CM | POA: Insufficient documentation

## 2020-12-03 DIAGNOSIS — C770 Secondary and unspecified malignant neoplasm of lymph nodes of head, face and neck: Secondary | ICD-10-CM

## 2020-12-03 DIAGNOSIS — Z5112 Encounter for antineoplastic immunotherapy: Secondary | ICD-10-CM | POA: Insufficient documentation

## 2020-12-03 NOTE — Progress Notes (Signed)
Pt is on our surgery schedule for a MRI under anesthesia on 12/05/20. Pt was scheduled for her Covid test yesterday and did not show. I saw Dr. Darrall Dears note from today and she did not show for her appt with him. That appt was for her H and P for the MRI with anesthesia. I called Dr. Liana Gerold office and spoke with Selena Batten and told her that pt had not shown up for these appts. She states she will reach out to pt to see what is going on with her.

## 2020-12-03 NOTE — Telephone Encounter (Signed)
Patient was a no show for her covid screening for her MRI under general sedation.   Patient was a no show for her visit with Dr Darnelle Catalan that would provide her H&P for her MRI under sedation.  Attempted to reach patient to discuss need to reschedule all of the above, left message pending call back.

## 2020-12-04 ENCOUNTER — Encounter (HOSPITAL_COMMUNITY): Payer: Self-pay | Admitting: Certified Registered"

## 2020-12-04 ENCOUNTER — Ambulatory Visit: Payer: Medicaid Other | Admitting: Family Medicine

## 2020-12-04 ENCOUNTER — Telehealth: Payer: Self-pay

## 2020-12-04 ENCOUNTER — Encounter (HOSPITAL_COMMUNITY): Payer: Self-pay | Admitting: *Deleted

## 2020-12-04 NOTE — Telephone Encounter (Signed)
RN spoke with patient to informed that message has been sent by MD to have infusion appointment rescheduled.    RN notified desk nurse for Dr. Barbaraann Cao regarding upcoming imaging/Covid testing.

## 2020-12-04 NOTE — Progress Notes (Signed)
Attempted to reach out to patient today about patients MRI that is scheduled for 12/05/20.  Patient did not answer.  If patient is not able to get a visit with Dr. Floy Sabina today to have the H&P completed then the patient will need to be rescheduled for her MRI

## 2020-12-04 NOTE — Progress Notes (Signed)
Spoke with patient.  She stated she was not aware of her MRI for tomorrow.  Instructed patient to contact Dr Melina Copa office to reschedule for her H&P and MRI./  Patient verbalized understanding.  MRI for tomorrow is cancelled

## 2020-12-05 ENCOUNTER — Ambulatory Visit (HOSPITAL_COMMUNITY)
Admission: RE | Admit: 2020-12-05 | Discharge: 2020-12-05 | Disposition: A | Discharge status: Home / Self Care | Payer: Medicaid Other | Source: Ambulatory Visit | Attending: Adult Health | Admitting: Adult Health

## 2020-12-05 ENCOUNTER — Ambulatory Visit (HOSPITAL_COMMUNITY): Admission: RE | Admit: 2020-12-05 | Payer: Medicaid Other | Source: Home / Self Care

## 2020-12-05 ENCOUNTER — Ambulatory Visit (HOSPITAL_COMMUNITY): Admission: RE | Admit: 2020-12-05 | Payer: Medicaid Other | Source: Ambulatory Visit

## 2020-12-05 ENCOUNTER — Encounter (HOSPITAL_COMMUNITY): Admission: RE | Payer: Self-pay | Source: Home / Self Care

## 2020-12-05 HISTORY — DX: Hyperlipidemia, unspecified: E78.5

## 2020-12-05 HISTORY — DX: Essential (primary) hypertension: I10

## 2020-12-05 SURGERY — MRI WITH ANESTHESIA
Anesthesia: General

## 2020-12-06 ENCOUNTER — Other Ambulatory Visit: Payer: Medicaid Other

## 2020-12-06 ENCOUNTER — Telehealth: Payer: Self-pay | Admitting: Oncology

## 2020-12-06 ENCOUNTER — Ambulatory Visit: Payer: Medicaid Other

## 2020-12-06 ENCOUNTER — Ambulatory Visit: Payer: Medicaid Other | Admitting: Adult Health

## 2020-12-06 NOTE — Telephone Encounter (Signed)
Called pt to confirm appt on 1/10 - r/s from 1/3 - unable to reach pt . Left message for patient with appt date an time

## 2020-12-08 NOTE — Progress Notes (Signed)
[facility]  [phone] [phone]     ID: [name] DOB: 08/21/1961  MR#: [id number]  XTG#:[id number]  Patient Care Team: Shannon Rakes, MD as PCP - General (Family Medicine) Shannon Dresser, MD as PCP - Advanced Heart Failure (Cardiology) Shannon Rudd, MD as Consulting Physician (Radiation Oncology) Shannon Levine, MD as Consulting Physician (Neurosurgery) Shannon Pilgrim, MD as Consulting Physician (Psychiatry) Shannon Skinner Acey Lav, MD as Consulting Physician (Psychiatry) Shannon Settle, MD as Referring Physician (Plastic Surgery) Shannon Limbo, MD as Consulting Physician (Plastic Surgery) Dillingham, Loel Lofty, DO as Attending Physician (Plastic Surgery) [name], Shannon Dad, MD as Consulting Physician (Oncology)   CHIEF COMPLAINT: Metastatic triple positive breast cancer  CURRENT TREATMENT: trastuzumab [name] 21 days], [denosumab/Xgeva (Q6W)], anastrozole   INTERVAL HISTORY: [name] returns today for follow up and treatment of her metastatic triple positive breast cancer.   She continues on trastuzumab, given every 21 days.  We switched back to this for maintenance after she took the TDM 1 for 8 months. Her most recent echocardiogram on 08/20/2020 showed an EF of 55-60%. She is due for repeat echocardiogram 01/01/2020 and follow up with Dr. Aundra Dubin.  She also received denosumab/Xgeva every 6 weeks and had no symptoms or complications.  However this has been held due to a healing dental extraction. Her most recent dose was on 08/21/2020.  She continues on anastrozole with good tolerance.    She did not show for prescreening her brain MRI and total spine screening on 12/05/2020. This, along with follow up with Dr. Mickeal Skinner, is being rescheduled.   REVIEW OF SYSTEMS: [name] tells me that she has pain in the right hip area sometimes going down her right buttock.  It is worse in the morning.  It is not improved with activity.  She takes Tylenol for  this and some ibuprofen as well.  She says Flexeril does not work and she does not find that [place] works either.  She denies unusual headaches, visual changes nausea or vomiting.  She has been found to have cataracts and is scheduled for right cataract surgery later this month.  She also is very unsteady and very concerned, appropriately, regarding falls.   COVID 19 VACCINATION STATUS: Status post Pfizer x2 with booster September 2021   BREAST CANCER HISTORY: From the original intake note:  [name] was aware of a "lemon sized lump in" her left axilla for about a year before bringing it to medical attention. By then she had developed left breast and left axillary swelling (June 2016). She presented to the local emergency room and had a chest CT scan 06/06/2015 which showed a nodule in the left breast measuring 0.9 cm and questionable left axillary adenopathy. She then proceeded to bilateral diagnostic mammography and left breast ultrasonography 06/19/2015. There were no prior films for comparison (last mammography 12 years prior).. The breast density was category C. Mammography showed in the left breast upper inner quadrant a 7 cm area including a small mass and significant pleomorphic calcifications. Ultrasonography defined the mass as measuring 1.2 cm. The left axilla appeared unremarkable. There was significant skin edema.  Biopsy of the left breast mass 06/19/2015 showed (SP [id number]) an invasive ductal carcinoma, grade 2, estrogen receptor 83% positive, progesterone receptor 26% positive, and HER-2 amplified by immunohistochemstry with a 3+ reading. The patient had biopsies of a separate area in the left breast August of the same year and this showed atypical ductal hyperplasia. (SP [id number]).  Accordingly after appropriate discussion on 08/21/2015  the patient proceeded to left mastectomy with left axillary sentinel lymph node sampling, which, since the lymph nodes were positive, extended to the  procedure to left axillary lymph node dissection. The pathology ([id number]) showed an invasive ductal carcinoma, grade 3, measuring in excess of 9 cm. There were also skin satellites, not contiguous with the invasive carcinoma. Margins were clear and ample. There was evidence of lymphovascular invasion. A total of 15 lymph nodes were removed, including 5 sentinel lymph nodes, all of which were positive, so that the final total was 14 out of 15 lymph nodes involved by tumor. There was evidence of extranodal extension. The final pathology was pT4b pN2a, stage IIIB  CA-27-29 and CEA 09/19/2015 were non-informative October 2016.  Unfortunately CT scans of the chest abdomen and pelvis 09/16/2015 showed bony metastases to the ri/ght scapula, left iliac crest, and also L4 and T-spine. There were questionable liver cysts which on repeat CT scan 03/02/2016 appear to be a little bit more well-defined, possibly a little larger. There were also some possible right upper lobe lung lesions.  Adjuvant treatment consisted of docetaxel, trastuzumab and pertuzumab, with the final (6th) docetaxel dose given 02/11/2016. She continues on trastuzumab and pertuzumab, with the 11th cycle given 05/05/2016. Echocardiogram 02/26/2016 showed an ejection fraction of 55%. She receives denosumab/Xgeva every 4 weeks.. She also receives radiation, started 06/09/201, to be completed 06/26/2016.  Her subsequent history is as detailed below   PAST MEDICAL HISTORY: Past Medical History:  Diagnosis Date  . Alcohol abuse    none since 2013  . Anemia    during chemo  . Anxiety    At age 12  . Arthritis Dx 2010  . Bipolar disorder ([place])   . Bronchitis   . Cancer ([place])    breast mets to brain  . Chronic pain    resolved per patient 12/25/19  . Complication of anesthesia   . Depression   . Family history of adverse reaction to anesthesia    MOther had PONV  . GERD (gastroesophageal reflux disease)   . Headache    hx   migraines  . HLD (hyperlipidemia)   . Hypertension   . Lymphedema of left arm   . Opiate dependence ([place])   . PONV (postoperative nausea and vomiting)   . Port-A-Cath in place   . PTSD (post-traumatic stress disorder)   . S/P endometrial ablation    in md's office    PAST SURGICAL HISTORY: Past Surgical History:  Procedure Laterality Date  . APPLICATION OF CRANIAL NAVIGATION N/A 08/14/2016   Procedure: APPLICATION OF CRANIAL NAVIGATION;  Surgeon: Shannon Levine, MD;  Location: [facility];  Service: Neurosurgery;  Laterality: N/A;  . BREAST RECONSTRUCTION Left    with silicone implant  . COLONOSCOPY W/ POLYPECTOMY    . CRANIOTOMY N/A 08/14/2016   Procedure: CRANIOTOMY TUMOR EXCISION WITH Lucky Rathke;  Surgeon: Shannon Levine, MD;  Location: [facility];  Service: Neurosurgery;  Laterality: N/A;  . FIBULA FRACTURE SURGERY Left   . MASTECTOMY Left   . RADIOLOGY WITH ANESTHESIA N/A 07/23/2016   Procedure: MRI OF BRAIN WITH AND WITHOUT;  Surgeon: Medication Radiologist, MD;  Location: [facility];  Service: Radiology;  Laterality: N/A;  . RADIOLOGY WITH ANESTHESIA N/A 09/08/2016   Procedure: MRI OF BRAIN WITH AND WITHOUT CONTRAST;  Surgeon: Medication Radiologist, MD;  Location: [facility];  Service: Radiology;  Laterality: N/A;  . RADIOLOGY WITH ANESTHESIA N/A 12/10/2016   Procedure: MRI OF BRAIN WITH AND WITHOUT;  Surgeon: Medication Radiologist, MD;  Location: [facility];  Service: Radiology;  Laterality: N/A;  . RADIOLOGY WITH ANESTHESIA N/A 03/02/2017   Procedure: MRI of BRAIN W and W/OUT CONTRAST;  Surgeon: Medication Radiologist, MD;  Location: [facility];  Service: Radiology;  Laterality: N/A;  . RADIOLOGY WITH ANESTHESIA N/A 07/29/2017   Procedure: RADIOLOGY WITH ANESTHESIA MRI OF BRAIN WITH AND WITHOUT CONTRAST;  Surgeon: Radiologist, Medication, MD;  Location: [facility];  Service: Radiology;  Laterality: N/A;  . RADIOLOGY WITH ANESTHESIA N/A 12/07/2017   Procedure: MRI WITH ANESTHESIA OF BRAIN WITH AND WITHOUT  CONTRAST;  Surgeon: Radiologist, Medication, MD;  Location: [facility];  Service: Radiology;  Laterality: N/A;  . RADIOLOGY WITH ANESTHESIA N/A 04/07/2018   Procedure: MRI OF BRAIN WITH AND WITHOUT CONTRAST;  Surgeon: Radiologist, Medication, MD;  Location: [facility];  Service: Radiology;  Laterality: N/A;  . RADIOLOGY WITH ANESTHESIA N/A 08/23/2018   Procedure: MRI WITH ANESTHESIA OF THE BRAIN WITH AND WITHOUT;  Surgeon: Radiologist, Medication, MD;  Location: [facility];  Service: Radiology;  Laterality: N/A;  . RADIOLOGY WITH ANESTHESIA N/A 01/24/2019   Procedure: MRI OF BRAIN WITH AND WITHOUT CONTRAST;  Surgeon: Radiologist, Medication, MD;  Location: [facility];  Service: Radiology;  Laterality: N/A;  . RADIOLOGY WITH ANESTHESIA N/A 07/06/2019   Procedure: MRI WITH ANESTHESIA OF BRAIN WITH AND WITHOUT CONTRAST;  Surgeon: Radiologist, Medication, MD;  Location: [facility];  Service: Radiology;  Laterality: N/A;  . RADIOLOGY WITH ANESTHESIA N/A 01/11/2020   Procedure: MRI WITH ANESTHESIA BRAIN WITH AND WITHOUT;  Surgeon: Radiologist, Medication, MD;  Location: [facility];  Service: Radiology;  Laterality: N/A;  . RADIOLOGY WITH ANESTHESIA N/A 05/21/2020   Procedure: MRI WITH ANESTHESIA OF BRAIN WITH AND WITHOUT CONTRAST;  Surgeon: Radiologist, Medication, MD;  Location: [facility];  Service: Radiology;  Laterality: N/A;  . right power port placement Right     FAMILY HISTORY Family History  Problem Relation Age of Onset  . Diabetes Mother   . Bipolar disorder Mother   . CAD Father    The patient's father still living, age 65, in [place]. He had prostate cancer at some point in the past. The patient's mother died at age 40 from complications of diabetes. The patient had no brothers, 2 sisters. A paternal grandmother had lung cancer in the setting of tobacco abuse. There is no other history of cancer in the family to her knowledge   GYNECOLOGIC HISTORY:  No LMP recorded. Patient has had an ablation. Menarche approximately  age 49. First live birth in 30. The patient is GX P2. She underwent endometrial ablation in 2016.   SOCIAL HISTORY: Updated January 2022 The patient is originally from [place]. She has lived in [place] before but more recently was in [place]. She is back here because she could not afford her rent in [place].  She now has has around 1 bedroom apartment on [address].  She is divorced. Her 2 children are [name] who lives in [place] and works as a Development worker, community, and [name] who also lives in [place] and works as a Catering manager. The patient has a grandchild, [name], 68 years old as of July 2019, living in [place] with his mother. The patient also has a grandson born in February 2019, who lives in [place]. The patient has not established herself with a local church yet.    ADVANCED DIRECTIVES: Not in place; at the 06/03/2016 visit the patient was given the appropriate forms to complete and notarize at her discretion  HEALTH MAINTENANCE: Social History   Tobacco Use  . Smoking status: Former Smoker    Packs/day: 1.00    Years: 43.00    Pack years: 43.00    Types: Cigarettes  . Smokeless tobacco: Never Used  . Tobacco comment: 3 day since last cigarette  Vaping Use  . Vaping Use: Some days  Substance Use Topics  . Alcohol use: No    Comment: no ETOH since 08/22/12  . Drug use: No    Comment: states she's in recovery program for 7 years     Colonoscopy:  PAP:  Bone density:   Allergies  Allergen Reactions  . Demerol [Meperidine Hcl] Itching and Nausea And Vomiting    INTOLERANCE >  N & V  . Erythromycin Rash    Current Outpatient Medications on File Prior to Visit  Medication Sig Dispense Refill  . azelastine (ASTELIN) 0.1 % nasal spray Place 2 sprays into both nostrils 2 (two) times daily. Use in each nostril as directed (Patient taking differently: Place 2 sprays into both nostrils daily. Use in each nostril as directed) 30 mL 12  . carvedilol (COREG)  3.125 MG tablet TAKE 1 TABLET (3.125 MG TOTAL) BY MOUTH 2 TIMES DAILY FOR 10 DAYS, THEN 2 TABLETS 2 TIMES DAILY. (Patient taking differently: Take 6.25 mg by mouth 2 (two) times daily.) 120 tablet 3  . cholecalciferol (VITAMIN D3) 25 MCG (1000 UNIT) tablet Take 2,000 Units by mouth daily.    [name] dexamethasone (DECADRON) 4 MG tablet Take 1 tablet (4 mg total) by mouth 2 (two) times daily. (Patient not taking: No sig reported) 10 tablet 0  . fluticasone (FLONASE) 50 MCG/ACT nasal spray SPRAY 2 SPRAYS INTO EACH NOSTRIL EVERY DAY (Patient taking differently: Place 2 sprays into both nostrils daily.) 16 mL 2  . lamoTRIgine (LAMICTAL) 100 MG tablet Take 100 mg by mouth every morning.    . loratadine (CLARITIN) 10 MG tablet Take 1 tablet (10 mg total) by mouth daily. 90 tablet 3  . Lurasidone HCl 60 MG TABS Take 60 mg by mouth daily.    . ondansetron (ZOFRAN) 8 MG tablet TAKE 1 TABLET (8 MG TOTAL) BY MOUTH EVERY 8 (EIGHT) HOURS AS NEEDED FOR NAUSEA OR VOMITING. (Patient taking differently: Take 8 mg by mouth 2 (two) times daily.) 90 tablet 1  . pantoprazole (PROTONIX) 40 MG tablet Take 1 tablet (40 mg total) by mouth daily. 90 tablet 3  . prochlorperazine (COMPAZINE) 10 MG tablet Take 1 tablet (10 mg total) by mouth every 6 (six) hours as needed for nausea or vomiting. (Patient not taking: No sig reported) 30 tablet 1  . rosuvastatin (CRESTOR) 10 MG tablet Take 1 tablet (10 mg total) by mouth daily. 90 tablet 3  . traZODone (DESYREL) 100 MG tablet Take 1 tablet (100 mg total) by mouth at bedtime. (Patient taking differently: Take 200-300 mg by mouth at bedtime.) 30 tablet 0   No current facility-administered medications on file prior to visit.    OBJECTIVE: White woman who appears stated age 32:   12/09/20 0827  BP: 102/64  Pulse: 73  Resp: 18  Temp: (!) 97.4 F (36.3 C)  TempSrc: Tympanic  SpO2: 97%  Weight: 156 lb 6.4 oz (70.9 kg)  Height: 5' 5"  (1.651 m)  Body mass index is 26.03 kg/m.   ECOG FS: 2   Sclerae unicteric, EOMs intact Wearing a mask Lungs no rales or rhonchi Heart regular rate and rhythm Abd soft, nontender, positive bowel sounds  MSK no focal spinal tenderness, chronic left grade 1 upper extremity lymphedema, compression sleeve not in place Neuro: nonfocal, well oriented, very unstable ambulation Breasts: The right breast is unremarkable.  The left breast is status post mastectomy with reconstruction.  There is no evidence of local recurrence.  Both axillae are benign.   LAB RESULTS: No results found for: LABCA2  CBC    Component Value Date/Time   WBC 4.1 12/09/2020 0812   RBC 3.41 (L) 12/09/2020 0812   HGB 10.9 (L) 12/09/2020 0812   HGB 13.5 07/05/2019 1129   HGB 12.1 10/26/2017 1038   HCT 32.6 (L) 12/09/2020 0812   HCT 35.7 10/26/2017 1038   PLT 90 (L) 12/09/2020 0812   PLT 54 (L) 07/05/2019 1129   PLT 167 10/26/2017 1038   MCV 95.6 12/09/2020 0812   MCV 98.4 10/26/2017 1038   MCH 32.0 12/09/2020 0812   MCHC 33.4 12/09/2020 0812   RDW 12.2 12/09/2020 0812   RDW 13.3 10/26/2017 1038   LYMPHSABS 0.9 12/09/2020 0812   LYMPHSABS 0.5 (L) 10/26/2017 1038   MONOABS 0.3 12/09/2020 0812   MONOABS 0.1 10/26/2017 1038   EOSABS 0.0 12/09/2020 0812   EOSABS 0.0 10/26/2017 1038   BASOSABS 0.0 12/09/2020 0812   BASOSABS 0.0 10/26/2017 1038   CMP Latest Ref Rng & Units 12/09/2020 11/12/2020 10/21/2020  Glucose 70 - 99 mg/dL 100(H) 97 101(H)  BUN 6 - 20 mg/dL 11 10 11   Creatinine 0.44 - 1.00 mg/dL 1.04(H) 0.99 0.96  Sodium 135 - 145 mmol/L 140 140 139  Potassium 3.5 - 5.1 mmol/L 4.3 4.0 4.1  Chloride 98 - 111 mmol/L 104 106 103  CO2 22 - 32 mmol/L 28 26 31   Calcium 8.9 - 10.3 mg/dL 9.3 8.9 9.1  Total Protein 6.5 - 8.1 g/dL 6.7 6.3(L) 6.2(L)  Total Bilirubin 0.3 - 1.2 mg/dL 0.4 0.4 0.4  Alkaline Phos 38 - 126 U/L 60 58 48  AST 15 - 41 U/L 27 26 22   ALT 0 - 44 U/L 17 20 18      STUDIES: No results found.   ELIGIBLE FOR AVAILABLE RESEARCH  PROTOCOL: no  ASSESSMENT: 60 y.o. [place] woman with stage IV left-sided breast cancer involving bone and central nervous system  (1) s/p left breast lower inner quadrant biopsy 06/19/2015 for a clinical T2-3 NX invasive ductal carcinoma, grade 2, triple positive.  (2) status post left mastectomy and axillary lymph node dissection  with immediate expander placement 07/18/2015 for an mpT4 pN2,stage IIIB invasive ductal carcinoma, grade 3, with negative margins.  (a) definitive implant exchange to be scheduled in December   METASTATIC DISEASE: October 2016  (3) CT scan of the chest abdomen and pelvis  09/16/2015 shows metastatic lesions in the right scapula, left iliac crest, L4, and T spine. There were questionable liver cysts, with repeat CT scan 03/02/2016 showing possible right upper lobe lung lesions and possibly increased liver lesions  (a) CT scan of the chest 06/17/2016 shows no active disease in the lungs or liver  (b) Bone scan July 2017 showed no evidence of bony metastatic disease   (c) head CT 07/08/2016 showed a cerebellar lesion, confirmed by MRI 07/23/2016, status post craniotomy 08/14/2016, confirming a metastatic deposit which was estrogen and progesterone receptor negative, HER-2 amplified with a signals ratio of 7.16, number per cell 13.25  (d) CA 27-29 is not informative  (4) received docetaxel every 3 weeks 6 together with trastuzumab and pertuzumab, last docetaxel dose 02/11/2016  (5) adjuvant  radiation7/03/2016 to 06/26/2016 at [facility]: 1. The Left chest wall was treated to 23.4 Gy in 13 fractions at 1.8 Gy per fraction. 2. The Left chest wall was boosted to 10 Gy in 5 fractions at 2 Gy per fraction. 3. The Left Sclav/PAB was treated to 23.4 Gy in 13 fractions at 1.8 Gy per fraction.  [Note: Including the patient's treatment in [facility] (received 15 fractions per Dr. Maryan Rued near [place], [place]), the patient received 50.4 Gy to the left chest wall and  supraclavicular region. ]  (6) started trastuzumab and pertuzumab October 2016, continuing every 3 weeks,  (a) echocardiogram 02/26/2016 showed a well preserved ejection fraction  (b) echocardiogram 07/01/2016 shows an ejection fraction in the 60-65%   (c) pertuzumab discontinued 10/2016 with uncontrolled diarrhea  (d) echocardiogram 11/11/2016 showed an ejection fraction in the 60-65%  (e) echocardiogram 03/03/2017 shows an ejection fraction of 60-65%  (f) echocardiogram on 05/19/2017 shows an ejection fraction of 55-60%  (g) echocardiogram 09/24/2017 shows the ejection fraction in the 60-65%  (h) echocardiogram 02/14/2018 shows an ejection fraction in the 60-65%  (I) echocardiogram  06/30/2018 shows an ejection fraction in the 55-60%  (m) echocardiogram on 12/08/2018 shows an ejection fraction in the 60-65% range  (7) started denosumab/Xgeva October 2017 given every 4 weeks, transitioned to every 8 weeks beginning 10/11/18 (every 6 weeks while giving TDM1 every 3 weeks)  (a) Xgeva held after 08/21/2020 dose due to denatal concerns  (8) started anastrozole October 2017   (a) bone scan 11/10/2016 shows no active disease  (b) chest CT scan 11/10/2016 stable, with no evidence of active disease  (c) chest CT and bone scan 07/02/2017 show no evidence of active disease  (d) CT scan of the chest with contrast 11/10/2017 shows some left axillary edema, but no evidence of thrombosis or adenopathy in that area, 0.9 cm precarinal lymph and 0.7 cm right upper lobe nodule node; bone lesions stable  (e) CT of the chest 05/04/2018 shows a 1.4 cm right lower paratracheal node which is slightly increased and a new right prevascular mediastinal node measuring 0.7 cm.  Bone lesions are stable.  (f) chest CT on 07/01/2018 shows no definite findings of metastatic disease in the thorax. Previously noted borderline enlarged low right paratracheal lymph node is stable to slightly decreased in size   (e) chest CT on  12/30/2018 notes mild increase in right paratracheal adneopathy, recommended PET scan.  Pet scan on 01/19/2019 showed a hypermetabolic and enlarged right paratracheal lymph node consistent with breast cancer recurrence.  (f)Trastuzumab discontinued due to February 2020 scans   (g) TDM-1 started on 01/31/2019 given every 21 days.  (h) Chest CT 05/15/2019 shows decrease in mediastinal adenopathy  (i) CT chest on 08/17/2019 shows resolution of thoracic adenopathy  (j) TDM 1 discontinued after 10/11/2019 dose (8 months treatment) because of thrombocytopenia  (k) trastuzumab resumed 11/01/2019, repeated every 21 days  (l) echocardiogram 12/06/2019 showed an ejection fraction in the 60-65% range  (m) chest CT scan, brain CT and bone scan 01/31/2020 show no evidence of active disease  (n) CT chest with contrast 06/10/2020 showed no evidence of actice/progressive disease  (9) history of bipolar disorder  (a) currently on Lamictal and Latuda as well as Desyrel and Astelin   (10) mild anemia with a significant drop in the MCV, ferritin 10 on 06/03/2016,   (a) Feraheme given 06/12/2016 and 06/18/2016  (11) tobacco abuse: Patient quit August 2021, vaping instead  (12) 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  (a) brain irradiation10/19/17-11/08/17: Whole brain/ 35 Gy in 14 fractions   (b) repeat brain MRI obtained 12/10/2016 shows no active disease in the brain  (c) repeat brain MRI 03/02/2017 shows no evidence of residual or recurrent disease  (d) repeat brain MRI 07/29/2017 shows no evidence of residual or recurrent disease  (e) repeat brain MRI 12/07/2017 shows no evidence of disease recurrence.  There is progressive white matter change secondary to prior treatment.  (f) repeat brain MRI 04/07/2018 showed no evidence of disease\  (g) repeat brain MRI on 08/23/2018 shows no evidence of disease  (h) repeat brain MRI on 01/25/2019 shows no evidence of disease  (I)  brain MRI 07/06/2019 shows no evidence of active disease  (j) brain MRI 01/11/2020 shows a subdural hematoma measuring 0.8 cm but no evidence of recurrent metastatic disease  (k) brain MRI 05/21/2020 no evidence of recurrent disease; chronic changes c/w prior treatment and recent subdural (otherwise resolved)   PLAN: [name] is now a little over 5 years out from definitive diagnosis of metastatic breast cancer.  Clinically she appears to be very stable, and I think when she finally gets her brain MRI and spinal MRI done as she will get good news.  I am obtaining plain hip films on the right today just to make sure we do not have a new acute development there.  I am refilling multiple of her medications today.  She prefers to obtain these through the [employer] where she has an account.  We are continuing the Herceptin every 3 weeks indefinitely.  She has an echocardiogram already scheduled.  I am adding a visit in 3 weeks just to make sure she got the MRI done and once the brain MRI done she can follow-up with Dr. Mickeal Skinner as originally suggested  We again reviewed fall precautions.  I commended her quitting smoking.  Total encounter time 40 minutes.Sarajane Jews C. Tajae Rybicki, MD 12/09/20 9:01 AM Medical Oncology and Hematology [facility] [place], [place] [address] Tel. [phone]    Fax. [phone]   I, Wilburn Mylar, am acting as scribe for Dr. Virgie Hunter. Rether Rison.  I, Lurline Del MD, have reviewed the above documentation for accuracy and completeness, and I agree with the above.   *Total Encounter Time as defined by the Centers for Medicare and Medicaid Services includes, in addition to the face-to-face time of a patient visit (documented in the note above) non-face-to-face time: obtaining and reviewing outside history, ordering and reviewing medications, tests or procedures, care coordination (communications with other health care  professionals or caregivers) and documentation in the medical record.

## 2020-12-09 ENCOUNTER — Telehealth: Payer: Self-pay | Admitting: *Deleted

## 2020-12-09 ENCOUNTER — Inpatient Hospital Stay: Payer: Medicaid Other

## 2020-12-09 ENCOUNTER — Other Ambulatory Visit: Payer: Self-pay | Admitting: *Deleted

## 2020-12-09 ENCOUNTER — Other Ambulatory Visit: Payer: Self-pay

## 2020-12-09 ENCOUNTER — Ambulatory Visit (HOSPITAL_COMMUNITY)
Admission: RE | Admit: 2020-12-09 | Discharge: 2020-12-09 | Disposition: A | Discharge status: Home / Self Care | Payer: Medicaid Other | Source: Ambulatory Visit | Attending: Oncology | Admitting: Oncology

## 2020-12-09 ENCOUNTER — Other Ambulatory Visit: Payer: Self-pay | Admitting: Oncology

## 2020-12-09 ENCOUNTER — Inpatient Hospital Stay (HOSPITAL_BASED_OUTPATIENT_CLINIC_OR_DEPARTMENT_OTHER): Payer: Medicaid Other | Admitting: Oncology

## 2020-12-09 ENCOUNTER — Ambulatory Visit: Payer: Medicaid Other | Admitting: Internal Medicine

## 2020-12-09 VITALS — BP 102/64 | HR 73 | Temp 97.4°F | Resp 18 | Ht 65.0 in | Wt 156.4 lb

## 2020-12-09 DIAGNOSIS — C50312 Malignant neoplasm of lower-inner quadrant of left female breast: Secondary | ICD-10-CM

## 2020-12-09 DIAGNOSIS — Z7189 Other specified counseling: Secondary | ICD-10-CM

## 2020-12-09 DIAGNOSIS — C50119 Malignant neoplasm of central portion of unspecified female breast: Secondary | ICD-10-CM

## 2020-12-09 DIAGNOSIS — C770 Secondary and unspecified malignant neoplasm of lymph nodes of head, face and neck: Secondary | ICD-10-CM

## 2020-12-09 DIAGNOSIS — I890 Lymphedema, not elsewhere classified: Secondary | ICD-10-CM | POA: Insufficient documentation

## 2020-12-09 DIAGNOSIS — C7931 Secondary malignant neoplasm of brain: Secondary | ICD-10-CM

## 2020-12-09 DIAGNOSIS — C7951 Secondary malignant neoplasm of bone: Secondary | ICD-10-CM | POA: Insufficient documentation

## 2020-12-09 DIAGNOSIS — Z79899 Other long term (current) drug therapy: Secondary | ICD-10-CM | POA: Diagnosis not present

## 2020-12-09 DIAGNOSIS — Z95828 Presence of other vascular implants and grafts: Secondary | ICD-10-CM

## 2020-12-09 DIAGNOSIS — F411 Generalized anxiety disorder: Secondary | ICD-10-CM

## 2020-12-09 DIAGNOSIS — D696 Thrombocytopenia, unspecified: Secondary | ICD-10-CM

## 2020-12-09 DIAGNOSIS — Z5112 Encounter for antineoplastic immunotherapy: Secondary | ICD-10-CM | POA: Diagnosis present

## 2020-12-09 LAB — CBC WITH DIFFERENTIAL/PLATELET
Abs Immature Granulocytes: 0.03 K/uL (ref 0.00–0.07)
Basophils Absolute: 0 K/uL (ref 0.0–0.1)
Basophils Relative: 0 %
Eosinophils Absolute: 0 K/uL (ref 0.0–0.5)
Eosinophils Relative: 0 %
HCT: 32.6 % — ABNORMAL LOW (ref 36.0–46.0)
Hemoglobin: 10.9 g/dL — ABNORMAL LOW (ref 12.0–15.0)
Immature Granulocytes: 1 %
Lymphocytes Relative: 23 %
Lymphs Abs: 0.9 K/uL (ref 0.7–4.0)
MCH: 32 pg (ref 26.0–34.0)
MCHC: 33.4 g/dL (ref 30.0–36.0)
MCV: 95.6 fL (ref 80.0–100.0)
Monocytes Absolute: 0.3 K/uL (ref 0.1–1.0)
Monocytes Relative: 8 %
Neutro Abs: 2.8 K/uL (ref 1.7–7.7)
Neutrophils Relative %: 68 %
Platelets: 90 K/uL — ABNORMAL LOW (ref 150–400)
RBC: 3.41 MIL/uL — ABNORMAL LOW (ref 3.87–5.11)
RDW: 12.2 % (ref 11.5–15.5)
WBC: 4.1 K/uL (ref 4.0–10.5)
nRBC: 0 % (ref 0.0–0.2)

## 2020-12-09 LAB — FERRITIN: Ferritin: 18 ng/mL (ref 11–307)

## 2020-12-09 LAB — COMPREHENSIVE METABOLIC PANEL
ALT: 17 U/L (ref 0–44)
AST: 27 U/L (ref 15–41)
Albumin: 3.7 g/dL (ref 3.5–5.0)
Alkaline Phosphatase: 60 U/L (ref 38–126)
Anion gap: 8 (ref 5–15)
BUN: 11 mg/dL (ref 6–20)
CO2: 28 mmol/L (ref 22–32)
Calcium: 9.3 mg/dL (ref 8.9–10.3)
Chloride: 104 mmol/L (ref 98–111)
Creatinine, Ser: 1.04 mg/dL — ABNORMAL HIGH (ref 0.44–1.00)
GFR, Estimated: >60 mL/min (ref >60)
Glucose, Bld: 100 mg/dL — ABNORMAL HIGH (ref 70–99)
Potassium: 4.3 mmol/L (ref 3.5–5.1)
Sodium: 140 mmol/L (ref 135–145)
Total Bilirubin: 0.4 mg/dL (ref 0.3–1.2)
Total Protein: 6.7 g/dL (ref 6.5–8.1)

## 2020-12-09 MED ORDER — DOCUSATE SODIUM 100 MG PO CAPS: 100.0000 mg | ORAL_CAPSULE | Freq: Every day | ORAL | 6 refills | Status: DC

## 2020-12-09 MED ORDER — ANASTROZOLE 1 MG PO TABS: 1.0000 mg | ORAL_TABLET | Freq: Every day | ORAL | 1 refills | Status: DC

## 2020-12-09 MED ORDER — POLYETHYLENE GLYCOL 3350 17 G PO PACK: 17.0000 g | PACK | Freq: Every day | ORAL | 6 refills | Status: DC | PRN

## 2020-12-09 MED ORDER — SODIUM CHLORIDE 0.9% FLUSH
10.0000 mL | INTRAVENOUS | Status: DC | PRN
  Administered 2020-12-09: 10 mL
  Filled 2020-12-09: qty 10

## 2020-12-09 MED ORDER — ACETAMINOPHEN 500 MG PO TABS: 1000.0000 mg | ORAL_TABLET | Freq: Three times a day (TID) | ORAL | 6 refills | Status: DC | PRN

## 2020-12-09 MED ORDER — SODIUM CHLORIDE 0.9% FLUSH
10.0000 mL | Freq: Once | INTRAVENOUS | Status: AC
  Administered 2020-12-09: 10 mL
  Filled 2020-12-09: qty 10

## 2020-12-09 MED ORDER — POLYETHYLENE GLYCOL 3350 17 G PO PACK: 17.0000 g | PACK | Freq: Every day | ORAL | 6 refills | Status: AC | PRN

## 2020-12-09 MED ORDER — ACETAMINOPHEN 325 MG PO TABS
650.0000 mg | ORAL_TABLET | Freq: Once | ORAL | Status: AC
  Administered 2020-12-09: 650 mg via ORAL

## 2020-12-09 MED ORDER — IBUPROFEN 800 MG PO TABS: 800.0000 mg | ORAL_TABLET | Freq: Two times a day (BID) | ORAL | 6 refills | Status: DC | PRN

## 2020-12-09 MED ORDER — HEPARIN SOD (PORK) LOCK FLUSH 100 UNIT/ML IV SOLN
500.0000 [IU] | Freq: Once | INTRAVENOUS | Status: AC | PRN
  Administered 2020-12-09: 500 [IU]
  Filled 2020-12-09: qty 5

## 2020-12-09 MED ORDER — ACETAMINOPHEN 325 MG PO TABS
ORAL_TABLET | ORAL | Status: AC
  Filled 2020-12-09: qty 2

## 2020-12-09 MED ORDER — SODIUM CHLORIDE 0.9 % IV SOLN
Freq: Once | INTRAVENOUS | Status: AC
  Filled 2020-12-09: qty 250

## 2020-12-09 MED ORDER — TRASTUZUMAB-DKST CHEMO 150 MG IV SOLR
6.0000 mg/kg | Freq: Once | INTRAVENOUS | Status: AC
  Administered 2020-12-09: 399 mg via INTRAVENOUS
  Filled 2020-12-09: qty 19

## 2020-12-09 MED FILL — GaviLAX POWD: 17 | 28 days supply | Qty: 476 | Fill #0

## 2020-12-09 MED FILL — IBUPROFEN 800 MG TAB: 800 | 30 days supply | Qty: 60 | Fill #0

## 2020-12-09 MED FILL — ACETAMINOPHEN EXTRA STRENGT: 500 | 16 days supply | Qty: 100 | Fill #0

## 2020-12-09 NOTE — Telephone Encounter (Signed)
-----   Message from Gardenia Phlegm, NP sent at 12/09/2020  2:03 PM EST ----- Xrays do not show any cause of hip pain.  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  Please let her know. ----- Message ----- From: Interface, Rad Results In Sent: 12/09/2020  11:37 AM EST To: Chauncey Cruel, MD

## 2020-12-09 NOTE — Patient Instructions (Signed)
[facility] Discharge Instructions for Patients Receiving Chemotherapy  Today you received the following chemotherapy agents: trastuzumab-dkst (Ogivri)  To help prevent nausea and vomiting after your treatment, we encourage you to take your nausea medication as directed.   If you develop nausea and vomiting that is not controlled by your nausea medication, call the clinic.   BELOW ARE SYMPTOMS THAT SHOULD BE REPORTED IMMEDIATELY:  *FEVER GREATER THAN 100.5 F  *CHILLS WITH OR WITHOUT FEVER  NAUSEA AND VOMITING THAT IS NOT CONTROLLED WITH YOUR NAUSEA MEDICATION  *UNUSUAL SHORTNESS OF BREATH  *UNUSUAL BRUISING OR BLEEDING  TENDERNESS IN MOUTH AND THROAT WITH OR WITHOUT PRESENCE OF ULCERS  *URINARY PROBLEMS  *BOWEL PROBLEMS  UNUSUAL RASH Items with * indicate a potential emergency and should be followed up as soon as possible.  Feel free to call the clinic should you have any questions or concerns. The clinic phone number is [phone].  Please show the CHEMO ALERT CARD at check-in to the Emergency Department and triage nurse.   

## 2020-12-09 NOTE — Telephone Encounter (Signed)
-----   Message from Lindsey Cornetto Causey, NP sent at 12/09/2020  2:03 PM EST ----- Xrays do not show any cause of hip pain.  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  Please let her know. ----- Message ----- From: Interface, Rad Results In Sent: 12/09/2020  11:37 AM EST To: Gustav C Magrinat, MD   

## 2020-12-09 NOTE — Telephone Encounter (Signed)
Notified of message below

## 2020-12-09 NOTE — Patient Instructions (Signed)

## 2020-12-09 NOTE — Progress Notes (Signed)
Addendum: [name] is anemic with a low ferritin.  We are going to give her some Feraheme with her next infusion.

## 2020-12-11 NOTE — Therapy (Signed)
[facility] [address] [place], [place], [address] Phone: [phone]   Fax:  [phone]  Patient Details  Name: [name] MRN: [id number] Date of Birth: October 20, 1961 Referring Provider:  No ref. provider found  Encounter Date: 12/11/2020  PHYSICAL THERAPY DISCHARGE SUMMARY/ non visit discharge  Visits from Start of Care: 2  Current functional level related to goals / functional outcomes: Pt did not return to therapy after visit on 07/26/20 cancelling remaining visits. Current status unknown. PT was very concerned with pt's home situation and had communicated with MD about this as pt very high fall risk with no help.   Remaining deficits: Pt was very high fall risk at last visit.   Education / Equipment: HEP  Plan: Patient agrees to discharge.  Patient goals were not met. Patient is being discharged due to not returning since the last visit.  ?????           Electa Sniff, PT, DPT, NCS 12/11/2020, 11:58 AM  [facility] [address] [place], [place], [address] Phone: [phone]   Fax:  [phone]

## 2020-12-26 ENCOUNTER — Other Ambulatory Visit: Payer: Self-pay | Admitting: *Deleted

## 2020-12-26 ENCOUNTER — Ambulatory Visit: Payer: Medicaid Other | Admitting: Family Medicine

## 2020-12-30 ENCOUNTER — Telehealth: Payer: Self-pay | Admitting: Physician Assistant

## 2020-12-30 ENCOUNTER — Ambulatory Visit (HOSPITAL_COMMUNITY)
Admission: RE | Admit: 2020-12-30 | Discharge: 2020-12-30 | Disposition: A | Discharge status: Home / Self Care | Payer: Medicaid Other | Source: Ambulatory Visit | Attending: Oncology | Admitting: Oncology

## 2020-12-30 ENCOUNTER — Inpatient Hospital Stay: Payer: Medicaid Other | Admitting: Physician Assistant

## 2020-12-30 ENCOUNTER — Inpatient Hospital Stay: Payer: Medicaid Other

## 2020-12-30 DIAGNOSIS — Z20822 Contact with and (suspected) exposure to covid-19: Secondary | ICD-10-CM | POA: Diagnosis not present

## 2020-12-30 DIAGNOSIS — Z01812 Encounter for preprocedural laboratory examination: Secondary | ICD-10-CM | POA: Diagnosis present

## 2020-12-30 LAB — SARS CORONAVIRUS 2 (TAT 6-24 HRS): SARS Coronavirus 2: NEGATIVE

## 2020-12-30 NOTE — Telephone Encounter (Signed)
The patient did not show up for her appointment today. I called her to check on her. She states she called earlier and spoke to someone about cancelling her appointment for today. She is canceling because she states she is constipated and is having abdominal bloating and "does not want to be up and down" at the office today. There was a form on my desk this morning for her H&P for her MRI to be done under sedation. Ideally, we would reschedule her this week before her MRI on Thursday so her physical exam portion  of her form can be filled out. I have sent a high priority scheduling message. Hoping to get her rescheduled with MG, LC, me, or any other APP this week.

## 2020-12-31 ENCOUNTER — Ambulatory Visit (HOSPITAL_BASED_OUTPATIENT_CLINIC_OR_DEPARTMENT_OTHER)
Admission: RE | Admit: 2020-12-31 | Discharge: 2020-12-31 | Disposition: A | Discharge status: Home / Self Care | Payer: Medicaid Other | Source: Ambulatory Visit | Attending: Cardiology | Admitting: Cardiology

## 2020-12-31 ENCOUNTER — Telehealth: Payer: Self-pay | Admitting: Oncology

## 2020-12-31 ENCOUNTER — Encounter (HOSPITAL_COMMUNITY): Payer: Self-pay | Admitting: Cardiology

## 2020-12-31 ENCOUNTER — Other Ambulatory Visit: Payer: Self-pay

## 2020-12-31 ENCOUNTER — Ambulatory Visit (HOSPITAL_COMMUNITY)
Admission: RE | Admit: 2020-12-31 | Discharge: 2020-12-31 | Disposition: A | Discharge status: Home / Self Care | Payer: Medicaid Other | Source: Ambulatory Visit | Attending: Cardiology | Admitting: Cardiology

## 2020-12-31 VITALS — BP 104/60 | HR 71 | Wt 160.4 lb

## 2020-12-31 DIAGNOSIS — E785 Hyperlipidemia, unspecified: Secondary | ICD-10-CM | POA: Diagnosis not present

## 2020-12-31 DIAGNOSIS — Z9882 Breast implant status: Secondary | ICD-10-CM | POA: Insufficient documentation

## 2020-12-31 DIAGNOSIS — C50919 Malignant neoplasm of unspecified site of unspecified female breast: Secondary | ICD-10-CM | POA: Diagnosis not present

## 2020-12-31 DIAGNOSIS — C50312 Malignant neoplasm of lower-inner quadrant of left female breast: Secondary | ICD-10-CM | POA: Insufficient documentation

## 2020-12-31 DIAGNOSIS — E7800 Pure hypercholesterolemia, unspecified: Secondary | ICD-10-CM | POA: Diagnosis not present

## 2020-12-31 DIAGNOSIS — I10 Essential (primary) hypertension: Secondary | ICD-10-CM | POA: Diagnosis not present

## 2020-12-31 DIAGNOSIS — Z9012 Acquired absence of left breast and nipple: Secondary | ICD-10-CM | POA: Insufficient documentation

## 2020-12-31 DIAGNOSIS — Z79899 Other long term (current) drug therapy: Secondary | ICD-10-CM | POA: Insufficient documentation

## 2020-12-31 DIAGNOSIS — Z01818 Encounter for other preprocedural examination: Secondary | ICD-10-CM | POA: Diagnosis present

## 2020-12-31 DIAGNOSIS — Z0189 Encounter for other specified special examinations: Secondary | ICD-10-CM

## 2020-12-31 DIAGNOSIS — Z87891 Personal history of nicotine dependence: Secondary | ICD-10-CM | POA: Diagnosis not present

## 2020-12-31 HISTORY — DX: Heart failure, unspecified: I50.9

## 2020-12-31 LAB — ECHOCARDIOGRAM COMPLETE
Area-P 1/2: 2.95 cm2
S' Lateral: 2.6 cm

## 2020-12-31 NOTE — Patient Instructions (Addendum)
No medication changes were made. Please continue all current medications as prescribed.  Your physician recommends that you schedule a follow-up appointment in: 6 month with an echo prior to your appointment.s. Please contact our office in July to schedule a August appointment    If you have any questions or concerns before your next appointment please send [place] a message through [place] or call our office at [phone].    TO LEAVE A MESSAGE FOR THE NURSE SELECT OPTION 2, PLEASE LEAVE A MESSAGE INCLUDING: . YOUR NAME . DATE OF BIRTH . CALL BACK NUMBER . REASON FOR CALL**this is important as we prioritize the call backs  YOU WILL RECEIVE A CALL BACK THE SAME DAY AS LONG AS YOU CALL BEFORE 4:00 PM   Do the following things EVERYDAY: 1) Weigh yourself in the morning before breakfast. Write it down and keep it in a log. 2) Take your medicines as prescribed 3) Eat low salt foods--Limit salt (sodium) to 2000 mg per day.  4) Stay as active as you can everyday 5) Limit all fluids for the day to less than 2 liters   At the [facility], you and your health needs are our priority. As part of our continuing mission to provide you with exceptional heart care, we have created designated Provider Care Teams. These Care Teams include your primary Cardiologist (physician) and Advanced Practice Providers (APPs- Physician Assistants and Nurse Practitioners) who all work together to provide you with the care you need, when you need it.   You may see any of the following providers on your designated Care Team at your next follow up: [name] Dr Glori Bickers . Dr Loralie Champagne . Darrick Grinder, NP . Lyda Jester, PA . Audry Riles, PharmD   Please be sure to bring in all your medications bottles to every appointment.

## 2020-12-31 NOTE — Telephone Encounter (Signed)
Scheduled appt per 1/31 sch msg - pt is aware of appt date and time   

## 2020-12-31 NOTE — Progress Notes (Signed)
  Echocardiogram 2D Echocardiogram has been performed.  Jennette Dubin 12/31/2020, 12:13 PM

## 2020-12-31 NOTE — Progress Notes (Signed)
[facility] OFFICE PROGRESS NOTE  Charlott Rakes, MD [place] [address]  DIAGNOSIS: Metastatic triple positive breast cancer  CURRENT THERAPY: Trastuzumab [every 21 days], [denosumab/Xgeva (Q6W)], anastrozole  INTERVAL HISTORY: [name] 60 y.o. female returns to the clinic today for a follow-up visit for her triple positive breast cancer. She was unable to come into the clinic for her appointment earlier this week due to constipation, which has resolved with the use of miralax. The patient continues taking trastuzumab every 21 days.  She is tolerating this well without any adverse side effects.  She had a repeat echocardiogram yesterday and a follow-up with Dr. Aundra Dubin. Her Delton See has been on hold since 08/21/2020 due to a healing dental extraction.  She also continues to take anastrozole with out any adverse side effects.  Also, the patient is scheduled for a brain MRI and spinal MRI on 01/02/2021.  The patient is then scheduled for follow-up visit with Dr. Mickeal Skinner in 01/07/2021.  At the patient's last appointment, the patient was found to be anemic with a low ferritin.  She is scheduled to also receive Feraheme today. The patient did not know she was to receive IV iron today. She wishes to delay this until her next appointment.   Otherwise, the patient denies any recent fever, chills, night sweats, or weight loss.  Denies any chest pain, shortness of breath, or cough.  She continues to complain of right hip pain which travels down her right buttock.  She takes Tylenol and ibuprofen with some relief. She also has weakness in her legs which has been occurring for 2 years. Denies any rashes or skin changes.  Denies any recent signs and symptoms of infection.  The patient is here today for evaluation and repeat blood work before starting cycle #19 trastuzumab.    BREAST CANCER HISTORY: From the original intake note:   [name] was aware of a "lemon sized lump in"  her left axilla for about a year before bringing it to medical attention. By then she had developed left breast and left axillary swelling (June 2016). She presented to the local emergency room and had a chest CT scan 06/06/2015 which showed a nodule in the left breast measuring 0.9 cm and questionable left axillary adenopathy. She then proceeded to bilateral diagnostic mammography and left breast ultrasonography 06/19/2015. There were no prior films for comparison (last mammography 12 years prior).. The breast density was category C. Mammography showed in the left breast upper inner quadrant a 7 cm area including a small mass and significant pleomorphic calcifications. Ultrasonography defined the mass as measuring 1.2 cm. The left axilla appeared unremarkable. There was significant skin edema.   Biopsy of the left breast mass 06/19/2015 showed (SP [id number]) an invasive ductal carcinoma, grade 2, estrogen receptor 83% positive, progesterone receptor 26% positive, and HER-2 amplified by immunohistochemstry with a 3+ reading. The patient had biopsies of a separate area in the left breast August of the same year and this showed atypical ductal hyperplasia. (SP [id number]).   Accordingly after appropriate discussion on 08/21/2015 the patient proceeded to left mastectomy with left axillary sentinel lymph node sampling, which, since the lymph nodes were positive, extended to the procedure to left axillary lymph node dissection. The pathology ([id number]) showed an invasive ductal carcinoma, grade 3, measuring in excess of 9 cm. There were also skin satellites, not contiguous with the invasive carcinoma. Margins were clear and ample. There was evidence of lymphovascular invasion. A total  of 15 lymph nodes were removed, including 5 sentinel lymph nodes, all of which were positive, so that the final total was 14 out of 15 lymph nodes involved by tumor. There was evidence of extranodal extension. The final pathology was  pT4b pN2a, stage IIIB   CA-27-29 and CEA 09/19/2015 were non-informative October 2016.   Unfortunately CT scans of the chest abdomen and pelvis 09/16/2015 showed bony metastases to the ri/ght scapula, left iliac crest, and also L4 and T-spine. There were questionable liver cysts which on repeat CT scan 03/02/2016 appear to be a little bit more well-defined, possibly a little larger. There were also some possible right upper lobe lung lesions.   Adjuvant treatment consisted of docetaxel, trastuzumab and pertuzumab, with the final (6th) docetaxel dose given 02/11/2016. She continues on trastuzumab and pertuzumab, with the 11th cycle given 05/05/2016. Echocardiogram 02/26/2016 showed an ejection fraction of 55%. She receives denosumab/Xgeva every 4 weeks.. She also receives radiation, started 06/09/201, to be completed 06/26/2016.   Her subsequent history is as detailed below  MEDICAL HISTORY: Past Medical History:  Diagnosis Date  . Alcohol abuse    none since 2013  . Anemia    during chemo  . Anxiety    At age 12  . Arthritis Dx 2010  . Bipolar disorder ([place])   . Bronchitis   . Cancer ([place])    breast mets to brain  . CHF (congestive heart failure) ([facility])   . Chronic pain    resolved per patient 12/25/19  . Complication of anesthesia   . Depression   . Family history of adverse reaction to anesthesia    MOther had PONV  . GERD (gastroesophageal reflux disease)   . Headache    hx  migraines  . HLD (hyperlipidemia)   . Hypertension   . Lymphedema of left arm   . Opiate dependence ([place])   . PONV (postoperative nausea and vomiting)   . Port-A-Cath in place   . PTSD (post-traumatic stress disorder)   . S/P endometrial ablation    in md's office    ALLERGIES:  is allergic to demerol [meperidine hcl] and erythromycin.  MEDICATIONS:  Current Outpatient Medications  Medication Sig Dispense Refill  . acetaminophen (TYLENOL) 500 MG tablet Take 2 tablets (1,000 mg total) by mouth  every 8 (eight) hours as needed for moderate pain. 100 tablet 6  . anastrozole (ARIMIDEX) 1 MG tablet Take 1 tablet (1 mg total) by mouth daily. 90 tablet 1  . azelastine (ASTELIN) 0.1 % nasal spray Place 2 sprays into both nostrils 2 (two) times daily. Use in each nostril as directed 30 mL 12  . carvedilol (COREG) 3.125 MG tablet TAKE 1 TABLET (3.125 MG TOTAL) BY MOUTH 2 TIMES DAILY FOR 10 DAYS, THEN 2 TABLETS 2 TIMES DAILY. 120 tablet 3  . cholecalciferol (VITAMIN D3) 25 MCG (1000 UNIT) tablet Take 2,000 Units by mouth daily.    [name] docusate sodium (COLACE) 100 MG capsule Take 1 capsule (100 mg total) by mouth daily. 100 capsule 6  . fluticasone (FLONASE) 50 MCG/ACT nasal spray SPRAY 2 SPRAYS INTO EACH NOSTRIL EVERY DAY 16 mL 2  . ibuprofen (ADVIL) 800 MG tablet Take 1 tablet (800 mg total) by mouth 2 (two) times daily as needed for moderate pain. Take with food. 60 tablet 6  . lamoTRIgine (LAMICTAL) 100 MG tablet Take 100 mg by mouth every morning.    . loratadine (CLARITIN) 10 MG tablet Take 1 tablet (10 mg total) by  mouth daily. 90 tablet 3  . Lurasidone HCl 60 MG TABS Take 60 mg by mouth daily.    . ondansetron (ZOFRAN) 8 MG tablet TAKE 1 TABLET (8 MG TOTAL) BY MOUTH EVERY 8 (EIGHT) HOURS AS NEEDED FOR NAUSEA OR VOMITING. (Patient taking differently: Take 8 mg by mouth 2 (two) times daily.) 90 tablet 1  . pantoprazole (PROTONIX) 40 MG tablet Take 1 tablet (40 mg total) by mouth daily. 90 tablet 3  . polyethylene glycol (MIRALAX / GLYCOLAX) 17 g packet Take 17 g by mouth daily as needed for mild constipation. 30 each 6  . rosuvastatin (CRESTOR) 10 MG tablet Take 1 tablet (10 mg total) by mouth daily. 90 tablet 3  . traZODone (DESYREL) 100 MG tablet Take 1 tablet (100 mg total) by mouth at bedtime. 30 tablet 0   No current facility-administered medications for this visit.    SURGICAL HISTORY:  Past Surgical History:  Procedure Laterality Date  . APPLICATION OF CRANIAL NAVIGATION N/A  08/14/2016   Procedure: APPLICATION OF CRANIAL NAVIGATION;  Surgeon: Erline Levine, MD;  Location: [facility];  Service: Neurosurgery;  Laterality: N/A;  . BREAST RECONSTRUCTION Left    with silicone implant  . COLONOSCOPY W/ POLYPECTOMY    . CRANIOTOMY N/A 08/14/2016   Procedure: CRANIOTOMY TUMOR EXCISION WITH Lucky Rathke;  Surgeon: Erline Levine, MD;  Location: [facility];  Service: Neurosurgery;  Laterality: N/A;  . FIBULA FRACTURE SURGERY Left   . MASTECTOMY Left   . RADIOLOGY WITH ANESTHESIA N/A 07/23/2016   Procedure: MRI OF BRAIN WITH AND WITHOUT;  Surgeon: Medication Radiologist, MD;  Location: [facility];  Service: Radiology;  Laterality: N/A;  . RADIOLOGY WITH ANESTHESIA N/A 09/08/2016   Procedure: MRI OF BRAIN WITH AND WITHOUT CONTRAST;  Surgeon: Medication Radiologist, MD;  Location: [facility];  Service: Radiology;  Laterality: N/A;  . RADIOLOGY WITH ANESTHESIA N/A 12/10/2016   Procedure: MRI OF BRAIN WITH AND WITHOUT;  Surgeon: Medication Radiologist, MD;  Location: [facility];  Service: Radiology;  Laterality: N/A;  . RADIOLOGY WITH ANESTHESIA N/A 03/02/2017   Procedure: MRI of BRAIN W and W/OUT CONTRAST;  Surgeon: Medication Radiologist, MD;  Location: [facility];  Service: Radiology;  Laterality: N/A;  . RADIOLOGY WITH ANESTHESIA N/A 07/29/2017   Procedure: RADIOLOGY WITH ANESTHESIA MRI OF BRAIN WITH AND WITHOUT CONTRAST;  Surgeon: Radiologist, Medication, MD;  Location: [facility];  Service: Radiology;  Laterality: N/A;  . RADIOLOGY WITH ANESTHESIA N/A 12/07/2017   Procedure: MRI WITH ANESTHESIA OF BRAIN WITH AND WITHOUT CONTRAST;  Surgeon: Radiologist, Medication, MD;  Location: [facility];  Service: Radiology;  Laterality: N/A;  . RADIOLOGY WITH ANESTHESIA N/A 04/07/2018   Procedure: MRI OF BRAIN WITH AND WITHOUT CONTRAST;  Surgeon: Radiologist, Medication, MD;  Location: [facility];  Service: Radiology;  Laterality: N/A;  . RADIOLOGY WITH ANESTHESIA N/A 08/23/2018   Procedure: MRI WITH ANESTHESIA OF THE BRAIN WITH AND  WITHOUT;  Surgeon: Radiologist, Medication, MD;  Location: [facility];  Service: Radiology;  Laterality: N/A;  . RADIOLOGY WITH ANESTHESIA N/A 01/24/2019   Procedure: MRI OF BRAIN WITH AND WITHOUT CONTRAST;  Surgeon: Radiologist, Medication, MD;  Location: [facility];  Service: Radiology;  Laterality: N/A;  . RADIOLOGY WITH ANESTHESIA N/A 07/06/2019   Procedure: MRI WITH ANESTHESIA OF BRAIN WITH AND WITHOUT CONTRAST;  Surgeon: Radiologist, Medication, MD;  Location: [facility];  Service: Radiology;  Laterality: N/A;  . RADIOLOGY WITH ANESTHESIA N/A 01/11/2020   Procedure: MRI WITH ANESTHESIA BRAIN WITH AND WITHOUT;  Surgeon: Radiologist, Medication, MD;  Location: [facility];  Service: Radiology;  Laterality: N/A;  . RADIOLOGY WITH ANESTHESIA N/A 05/21/2020   Procedure: MRI WITH ANESTHESIA OF BRAIN WITH AND WITHOUT CONTRAST;  Surgeon: Radiologist, Medication, MD;  Location: [facility];  Service: Radiology;  Laterality: N/A;  . right power port placement Right     The patient's father still living, age 74, in [place]. He had prostate cancer at some point in the past. The patient's mother died at age 54 from complications of diabetes. The patient had no brothers, 2 sisters. A paternal grandmother had lung cancer in the setting of tobacco abuse. There is no other history of cancer in the family to her knowledge   GYNECOLOGIC HISTORY:  No LMP recorded. Patient has had an ablation. Menarche approximately age 73. First live birth in 4. The patient is GX P2. She underwent endometrial ablation in 2016.   SOCIAL HISTORY: Updated January 2022 The patient is originally from [place]. She has lived in [place] before but more recently was in [place]. She is back here because she could not afford her rent in [place].  She now has has around 1 bedroom apartment on [address].  She is divorced. Her 2 children are [name] who lives in [place] and works as a Development worker, community, and [name] who also lives in [place] and  works as a Catering manager. The patient has a grandchild, Arelia Longest, 5 years old as of July 2019, living in [place] with his mother. The patient also has a grandson born in February 2019, who lives in [place]. The patient has not established herself with a local church yet.                          ADVANCED DIRECTIVES: Not in place; at the 06/03/2016 visit the patient was given the appropriate forms to complete and notarize at her discretion   REVIEW OF SYSTEMS:   Review of Systems  Constitutional: Negative for appetite change, chills, fatigue, fever and unexpected weight change.  HENT: Negative for mouth sores, nosebleeds, sore throat and trouble swallowing.   Eyes: Negative for eye problems and icterus.  Respiratory: Negative for cough, hemoptysis, shortness of breath and wheezing.   Cardiovascular: Negative for chest pain and leg swelling.  Gastrointestinal: Negative for abdominal pain, constipation, diarrhea, nausea and vomiting.  Genitourinary: Negative for bladder incontinence, difficulty urinating, dysuria, frequency and hematuria.   Musculoskeletal: Negative for back pain, gait problem, neck pain and neck stiffness.  Skin: Negative for itching and rash.  Neurological: Positive for weakness. Negative for dizziness, extremity weakness, gait problem, headaches, light-headedness and seizures.  Hematological: Negative for adenopathy. Does not bruise/bleed easily.  Psychiatric/Behavioral: Negative for confusion, depression and sleep disturbance. The patient is not nervous/anxious.     PHYSICAL EXAMINATION:  There were no vitals taken for this visit.  ECOG PERFORMANCE STATUS: 1 - Symptomatic but completely ambulatory  Physical Exam  Constitutional: Oriented to person, place, and time and well-developed, well-nourished, and in no distress.  HENT:  Head: Normocephalic and atraumatic.  Mouth/Throat: Oropharynx is clear and moist. No oropharyngeal exudate.  Eyes: Conjunctivae are  normal. Right eye exhibits no discharge. Left eye exhibits no discharge. No scleral icterus.  Neck: Normal range of motion. Neck supple.  Cardiovascular: Normal rate, regular rhythm, normal heart sounds and intact distal pulses.   Pulmonary/Chest: Effort normal and breath sounds normal. No respiratory distress. No wheezes. No rales.  Abdominal: Soft. Bowel sounds are normal. Exhibits  no distension and no mass. There is no tenderness.  Musculoskeletal: Normal range of motion. Mild left upper extremity lymphedema.  Lymphadenopathy:    No cervical adenopathy.  Neurological: Alert and oriented to person, place, and time. Exhibits normal muscle tone. Gait normal. Coordination normal.  Skin: Skin is warm and dry. No rash noted. Not diaphoretic. No erythema. No pallor.  Psychiatric: Mood, memory and judgment normal.  Breasts: The right breast is unremarkable.  The left breast is status post mastectomy with reconstruction.  There is no evidence of local recurrence.  Both axillae are benign.  LABORATORY DATA: Lab Results  Component Value Date   WBC 4.1 12/09/2020   HGB 10.9 (L) 12/09/2020   HCT 32.6 (L) 12/09/2020   MCV 95.6 12/09/2020   PLT 90 (L) 12/09/2020      Chemistry      Component Value Date/Time   NA 140 12/09/2020 0812   NA 140 10/26/2017 1038   K 4.3 12/09/2020 0812   K 3.8 10/26/2017 1038   CL 104 12/09/2020 0812   CO2 28 12/09/2020 0812   CO2 24 10/26/2017 1038   BUN 11 12/09/2020 0812   BUN 12.0 10/26/2017 1038   CREATININE 1.04 (H) 12/09/2020 0812   CREATININE 1.08 (H) 05/02/2019 0831   CREATININE 0.8 10/26/2017 1038      Component Value Date/Time   CALCIUM 9.3 12/09/2020 0812   CALCIUM 8.7 10/26/2017 1038   ALKPHOS 60 12/09/2020 0812   ALKPHOS 34 (L) 10/26/2017 1038   AST 27 12/09/2020 0812   AST 38 05/02/2019 0831   AST 35 (H) 10/26/2017 1038   ALT 17 12/09/2020 0812   ALT 36 05/02/2019 0831   ALT 48 10/26/2017 1038   BILITOT 0.4 12/09/2020 0812   BILITOT  0.3 05/02/2019 0831   BILITOT <0.22 10/26/2017 1038       RADIOGRAPHIC STUDIES:  ECHOCARDIOGRAM COMPLETE  Result Date: 12/31/2020    ECHOCARDIOGRAM REPORT   Patient Name:   AMAIRANI SHUEY Date of Exam: 12/31/2020 Medical Rec #:  [id number]    Height:       65.0 in Accession #:    [id number]   Weight:       156.4 lb Date of Birth:  [birth year]    BSA:          1.782 m Patient Age:    17 years     BP:           104/60 mmHg Patient Gender: F            HR:           70 bpm. Exam Location:  Outpatient Procedure: 2D Echo Indications:    Chemotherapy evaluation v87.41  History:        Patient has prior history of Echocardiogram examinations, most                 recent 08/20/2020. Breast Cancer; Risk Factors:Hypertension and                 Dyslipidemia.  Sonographer:    Mikki Santee RDCS (AE) Referring Phys: [id number] [facility]  Sonographer Comments: Technically difficult study due to poor echo windows. Image acquisition challenging due to breast implants. IMPRESSIONS  1. Left ventricular ejection fraction, by estimation, is 60 to 65%. The left ventricle has normal function. The left ventricle has no regional wall motion abnormalities. Left ventricular diastolic parameters were normal. GLS normal, -22.8%.  2. Right ventricular systolic function is normal. The  right ventricular size is normal. Tricuspid regurgitation signal is inadequate for assessing PA pressure.  3. The mitral valve is normal in structure. No evidence of mitral valve regurgitation. No evidence of mitral stenosis.  4. The aortic valve is tricuspid. Aortic valve regurgitation is not visualized. No aortic stenosis is present.  5. The inferior vena cava is normal in size with greater than 50% respiratory variability, suggesting right atrial pressure of 3 mmHg. FINDINGS  Left Ventricle: Left ventricular ejection fraction, by estimation, is 60 to 65%. The left ventricle has normal function. The left ventricle has no regional wall motion abnormalities.  The left ventricular internal cavity size was normal in size. There is  no left ventricular hypertrophy. Left ventricular diastolic parameters were normal. Right Ventricle: The right ventricular size is normal. No increase in right ventricular wall thickness. Right ventricular systolic function is normal. Tricuspid regurgitation signal is inadequate for assessing PA pressure. Left Atrium: Left atrial size was normal in size. Right Atrium: Right atrial size was normal in size. Pericardium: There is no evidence of pericardial effusion. Mitral Valve: The mitral valve is normal in structure. No evidence of mitral valve regurgitation. No evidence of mitral valve stenosis. Tricuspid Valve: The tricuspid valve is normal in structure. Tricuspid valve regurgitation is not demonstrated. Aortic Valve: The aortic valve is tricuspid. Aortic valve regurgitation is not visualized. No aortic stenosis is present. Pulmonic Valve: The pulmonic valve was normal in structure. Pulmonic valve regurgitation is not visualized. Aorta: The aortic root is normal in size and structure. Venous: The inferior vena cava is normal in size with greater than 50% respiratory variability, suggesting right atrial pressure of 3 mmHg. IAS/Shunts: No atrial level shunt detected by color flow Doppler.  LEFT VENTRICLE PLAX 2D LVIDd:         3.90 cm  Diastology LVIDs:         2.60 cm  LV e' medial:    8.38 cm/s LV PW:         1.10 cm  LV E/e' medial:  8.2 LV IVS:        1.10 cm  LV e' lateral:   11.60 cm/s LVOT diam:     2.00 cm  LV E/e' lateral: 5.9 LV SV:         62 LV SV Index:   35 LVOT Area:     3.14 cm  RIGHT VENTRICLE RV S prime:     9.03 cm/s TAPSE (M-mode): 1.8 cm LEFT ATRIUM             Index       RIGHT ATRIUM          Index LA diam:        3.30 cm 1.85 cm/m  RA Area:     9.59 cm LA Vol (A2C):   35.6 ml 19.98 ml/m RA Volume:   20.00 ml 11.22 ml/m LA Vol (A4C):   27.5 ml 15.43 ml/m LA Biplane Vol: 33.2 ml 18.63 ml/m  AORTIC VALVE LVOT Vmax:    86.20 cm/s LVOT Vmean:  55.500 cm/s LVOT VTI:    0.197 m  AORTA Ao Root diam: 2.70 cm MITRAL VALVE MV Area (PHT): 2.95 cm    SHUNTS MV Decel Time: 257 msec    Systemic VTI:  0.20 m MV E velocity: 68.90 cm/s  Systemic Diam: 2.00 cm MV A velocity: 59.60 cm/s MV E/A ratio:  1.16 Loralie Champagne MD Electronically signed by Loralie Champagne MD Signature Date/Time: 12/31/2020/12:28:54 PM  Final    DG Hip Unilat W or W/O Pelvis 1 View Right  Result Date: 12/09/2020 CLINICAL DATA:  RIGHT hip pain posteriorly, history breast cancer EXAM: DG HIP (WITH OR WITHOUT PELVIS) 1V RIGHT COMPARISON:  09/11/2020 Correlation: Bone scan 06/10/2020, PET-CT 01/19/2019 FINDINGS: Osseous mineralization normal. Hip and SI joint spaces preserved. No acute fracture or dislocation. Unchanged since 09/11/2020 1.5 cm diameter sclerotic focus at the LEFT iliac bone unchanged since 09/11/2020. 2.5 x 2.3 cm diameter vague area of sclerosis within RIGHT iliac bone, unchanged since PET-CT. No additional focal areas of sclerosis or lucency identified. IMPRESSION: Stable sclerotic lesions within the iliac bones bilaterally. No new osseous findings. Electronically Signed   By: Lavonia Dana M.D.   On: 12/09/2020 11:34      ASSESSMENT/PLAN:  ELIGIBLE FOR AVAILABLE RESEARCH PROTOCOL: no   ASSESSMENT: 60 y.o. 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   (2) status post left mastectomy and axillary lymph node dissection  with immediate expander placement 07/18/2015 for an mpT4 pN2,stage IIIB invasive ductal carcinoma, grade 3, with negative margins.             (a) definitive implant exchange to be scheduled in December             METASTATIC DISEASE: October 2016  (3) CT scan of the chest abdomen and pelvis  09/16/2015 shows metastatic lesions in the right  scapula, left iliac crest, L4, and T spine. There were questionable liver cysts, with repeat CT scan 03/02/2016 showing possible right upper lobe lung lesions and possibly increased liver lesions             (a) CT scan of the chest 06/17/2016 shows no active disease in the lungs or liver             (b) Bone scan July 2017 showed no evidence of bony metastatic disease              (c) head CT 07/08/2016 showed a cerebellar lesion, confirmed by MRI 07/23/2016, status post craniotomy 08/14/2016, confirming a metastatic deposit which was estrogen and progesterone receptor negative, HER-2 amplified with a signals ratio of 7.16, number per cell 13.25             (d) CA 27-29 is not informative   (4) received docetaxel every 3 weeks 6 together with trastuzumab and pertuzumab, last docetaxel dose 02/11/2016   (5) adjuvant radiation7/03/2016 to 06/26/2016 at [facility]: 1. The Left chest wall was treated to 23.4 Gy in 13 fractions at 1.8 Gy per fraction. 2. The Left chest wall was boosted to 10 Gy in 5 fractions at 2 Gy per fraction. 3. The Left Sclav/PAB was treated to 23.4 Gy in 13 fractions at 1.8 Gy per fraction.   [Note: Including the patient's treatment in [place] (received 15 fractions per Dr. Maryan Rued near [place], [place]), the patient received 50.4 Gy to the left chest wall and supraclavicular region. ]   (6) started trastuzumab and pertuzumab October 2016, continuing every 3 weeks,             (a) echocardiogram 02/26/2016 showed a well preserved ejection fraction             (b) echocardiogram 07/01/2016 shows an ejection fraction in the 60-65%              (  c) pertuzumab discontinued 10/2016 with uncontrolled diarrhea             (d) echocardiogram 11/11/2016 showed an ejection fraction in the 60-65%             (e) echocardiogram 03/03/2017 shows an ejection fraction of 60-65%             (f) echocardiogram on 05/19/2017 shows an ejection fraction of 55-60%             (g) echocardiogram  09/24/2017 shows the ejection fraction in the 60-65%             (h) echocardiogram 02/14/2018 shows an ejection fraction in the 60-65%             (I) echocardiogram  06/30/2018 shows an ejection fraction in the 55-60%             (m) echocardiogram on 12/08/2018 shows an ejection fraction in the 60-65% range   (7) started denosumab/Xgeva October 2017 given every 4 weeks, transitioned to every 8 weeks beginning 10/11/18 (every 6 weeks while giving TDM1 every 3 weeks)             (a) Xgeva held after 08/21/2020 dose due to denatal concerns   (8) started anastrozole October 2017              (a) bone scan 11/10/2016 shows no active disease             (b) chest CT scan 11/10/2016 stable, with no evidence of active disease             (c) chest CT and bone scan 07/02/2017 show no evidence of active disease             (d) CT scan of the chest with contrast 11/10/2017 shows some left axillary edema, but no evidence of thrombosis or adenopathy in that area, 0.9 cm precarinal lymph and 0.7 cm right upper lobe nodule node; bone lesions stable             (e) CT of the chest 05/04/2018 shows a 1.4 cm right lower paratracheal node which is slightly increased and a new right prevascular mediastinal node measuring 0.7 cm.  Bone lesions are stable.             (f) chest CT on 07/01/2018 shows no definite findings of metastatic disease in the thorax. Previously noted borderline enlarged low right paratracheal lymph node is stable to slightly decreased in size              (e) chest CT on 12/30/2018 notes mild increase in right paratracheal adneopathy, recommended PET scan.  Pet scan on 01/19/2019 showed a hypermetabolic and enlarged right paratracheal lymph node consistent with breast cancer recurrence.             (f)Trastuzumab discontinued due to February 2020 scans              (g) TDM-1 started on 01/31/2019 given every 21 days.             (h) Chest CT 05/15/2019 shows decrease in mediastinal adenopathy              (i) CT chest on 08/17/2019 shows resolution of thoracic adenopathy             (j) TDM 1 discontinued after 10/11/2019 dose (8 months treatment) because of thrombocytopenia             (k) trastuzumab  resumed 11/01/2019, repeated every 21 days             (l) echocardiogram 12/06/2019 showed an ejection fraction in the 60-65% range             (m) chest CT scan, brain CT and bone scan 01/31/2020 show no evidence of active disease             (n) CT chest with contrast 06/10/2020 showed no evidence of actice/progressive disease   (9) history of bipolar disorder             (a) currently on Lamictal and Latuda as well as Desyrel and Astelin    (10) mild anemia with a significant drop in the MCV, ferritin 10 on 06/03/2016,              (a) Feraheme given 06/12/2016 and 06/18/2016   (11) tobacco abuse: Patient quit August 2021, vaping instead   (12) 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             (a) brain irradiation10/19/17-11/08/17: Whole brain/ 35 Gy in 14 fractions              (b) repeat brain MRI obtained 12/10/2016 shows no active disease in the brain             (c) repeat brain MRI 03/02/2017 shows no evidence of residual or recurrent disease             (d) repeat brain MRI 07/29/2017 shows no evidence of residual or recurrent disease             (e) repeat brain MRI 12/07/2017 shows no evidence of disease recurrence.  There is progressive white matter change secondary to prior treatment.             (f) repeat brain MRI 04/07/2018 showed no evidence of disease\             (g) repeat brain MRI on 08/23/2018 shows no evidence of disease             (h) repeat brain MRI on 01/25/2019 shows no evidence of disease             (I) brain MRI 07/06/2019 shows no evidence of active disease             (j) brain MRI 01/11/2020 shows a subdural hematoma measuring 0.8 cm but no evidence of recurrent metastatic disease             (k) brain MRI  05/21/2020 no evidence of recurrent disease; chronic changes c/w prior treatment and recent subdural (otherwise resolved)     PLAN: [name] is a very pleasant 60 year old female with stage IV breast cancer. She is undergoing treatment with Trastuzumab and anastrozole. She is also being treated with xgeva, although that is on hold due to a dental extraction. Labs were reviewed. She has chronic stable thrombocytopenia. She will proceed with cycle #19 of Trastuzumab  today as scheduled.    She wishes to delay her feraheme appointment at her next infusion. She was not prepared to stay longer today. We will ensure her next infusion is the correct length of time for herceptin and feraheme.   I reviewed her schedule with her. She will get her brain MRI and spinal MRI on Thursday as scheduled. She will then follow up with Dr. Mickeal Skinner on 01/07/21 as scheduled.    We are continuing the Herceptin  every 3 weeks indefinitely.  \  We will fax her completed MRI with sedation H&P over the the radiology department.   The patient was advised to call immediately if she has any concerning symptoms in the interval. The patient voices understanding of current disease status and treatment options and is in agreement with the current care plan. All questions were answered. The patient knows to call the clinic with any problems, questions or concerns. We can certainly see the patient much sooner if necessary  No orders of the defined types were placed in this encounter.    I spent 20-29 minutes in this encounter.   Ayisha Pol L Davidson Palmieri, PA-C 12/31/20

## 2021-01-01 ENCOUNTER — Inpatient Hospital Stay: Payer: Medicaid Other

## 2021-01-01 ENCOUNTER — Other Ambulatory Visit: Payer: Self-pay | Admitting: *Deleted

## 2021-01-01 ENCOUNTER — Inpatient Hospital Stay (HOSPITAL_BASED_OUTPATIENT_CLINIC_OR_DEPARTMENT_OTHER): Payer: Medicaid Other | Admitting: Physician Assistant

## 2021-01-01 ENCOUNTER — Inpatient Hospital Stay: Payer: Medicaid Other | Attending: Medical

## 2021-01-01 ENCOUNTER — Encounter (HOSPITAL_COMMUNITY): Payer: Self-pay

## 2021-01-01 ENCOUNTER — Other Ambulatory Visit: Payer: Self-pay

## 2021-01-01 VITALS — BP 114/80 | HR 94 | Temp 97.8°F | Resp 18 | Ht 65.0 in | Wt 163.5 lb

## 2021-01-01 DIAGNOSIS — Z79899 Other long term (current) drug therapy: Secondary | ICD-10-CM | POA: Diagnosis not present

## 2021-01-01 DIAGNOSIS — C7931 Secondary malignant neoplasm of brain: Secondary | ICD-10-CM | POA: Diagnosis not present

## 2021-01-01 DIAGNOSIS — C7951 Secondary malignant neoplasm of bone: Secondary | ICD-10-CM | POA: Insufficient documentation

## 2021-01-01 DIAGNOSIS — C50312 Malignant neoplasm of lower-inner quadrant of left female breast: Secondary | ICD-10-CM

## 2021-01-01 DIAGNOSIS — C50119 Malignant neoplasm of central portion of unspecified female breast: Secondary | ICD-10-CM

## 2021-01-01 DIAGNOSIS — C770 Secondary and unspecified malignant neoplasm of lymph nodes of head, face and neck: Secondary | ICD-10-CM

## 2021-01-01 DIAGNOSIS — F411 Generalized anxiety disorder: Secondary | ICD-10-CM

## 2021-01-01 DIAGNOSIS — Z95828 Presence of other vascular implants and grafts: Secondary | ICD-10-CM

## 2021-01-01 DIAGNOSIS — Z5112 Encounter for antineoplastic immunotherapy: Secondary | ICD-10-CM | POA: Diagnosis present

## 2021-01-01 DIAGNOSIS — D509 Iron deficiency anemia, unspecified: Secondary | ICD-10-CM

## 2021-01-01 DIAGNOSIS — D696 Thrombocytopenia, unspecified: Secondary | ICD-10-CM

## 2021-01-01 LAB — COMPREHENSIVE METABOLIC PANEL
ALT: 16 U/L (ref 0–44)
AST: 22 U/L (ref 15–41)
Albumin: 3.9 g/dL (ref 3.5–5.0)
Alkaline Phosphatase: 56 U/L (ref 38–126)
Anion gap: 7 (ref 5–15)
BUN: 11 mg/dL (ref 6–20)
CO2: 28 mmol/L (ref 22–32)
Calcium: 9.2 mg/dL (ref 8.9–10.3)
Chloride: 108 mmol/L (ref 98–111)
Creatinine, Ser: 1 mg/dL (ref 0.44–1.00)
GFR, Estimated: >60 mL/min (ref >60)
Glucose, Bld: 80 mg/dL (ref 70–99)
Potassium: 3.8 mmol/L (ref 3.5–5.1)
Sodium: 143 mmol/L (ref 135–145)
Total Bilirubin: 0.3 mg/dL (ref 0.3–1.2)
Total Protein: 6.2 g/dL — ABNORMAL LOW (ref 6.5–8.1)

## 2021-01-01 LAB — CBC WITH DIFFERENTIAL/PLATELET
Abs Immature Granulocytes: 0.01 10*3/uL (ref 0.00–0.07)
Basophils Absolute: 0 10*3/uL (ref 0.0–0.1)
Basophils Relative: 1 %
Eosinophils Absolute: 0 10*3/uL (ref 0.0–0.5)
Eosinophils Relative: 0 %
HCT: 32.8 % — ABNORMAL LOW (ref 36.0–46.0)
Hemoglobin: 10.7 g/dL — ABNORMAL LOW (ref 12.0–15.0)
Immature Granulocytes: 0 %
Lymphocytes Relative: 30 %
Lymphs Abs: 0.8 10*3/uL (ref 0.7–4.0)
MCH: 31.2 pg (ref 26.0–34.0)
MCHC: 32.6 g/dL (ref 30.0–36.0)
MCV: 95.6 fL (ref 80.0–100.0)
Monocytes Absolute: 0.3 10*3/uL (ref 0.1–1.0)
Monocytes Relative: 10 %
Neutro Abs: 1.6 10*3/uL — ABNORMAL LOW (ref 1.7–7.7)
Neutrophils Relative %: 59 %
Platelets: 85 10*3/uL — ABNORMAL LOW (ref 150–400)
RBC: 3.43 MIL/uL — ABNORMAL LOW (ref 3.87–5.11)
RDW: 12.3 % (ref 11.5–15.5)
WBC: 2.8 10*3/uL — ABNORMAL LOW (ref 4.0–10.5)
nRBC: 0 % (ref 0.0–0.2)

## 2021-01-01 MED ORDER — TRASTUZUMAB-DKST CHEMO 150 MG IV SOLR
6.0000 mg/kg | Freq: Once | INTRAVENOUS | Status: AC
  Administered 2021-01-01: 399 mg via INTRAVENOUS
  Filled 2021-01-01: qty 19

## 2021-01-01 MED ORDER — SODIUM CHLORIDE 0.9% FLUSH
10.0000 mL | Freq: Once | INTRAVENOUS | Status: AC
  Administered 2021-01-01: 10 mL
  Filled 2021-01-01: qty 10

## 2021-01-01 MED ORDER — HEPARIN SOD (PORK) LOCK FLUSH 100 UNIT/ML IV SOLN
500.0000 [IU] | Freq: Once | INTRAVENOUS | Status: AC | PRN
  Administered 2021-01-01: 500 [IU]
  Filled 2021-01-01: qty 5

## 2021-01-01 MED ORDER — SODIUM CHLORIDE 0.9 % IV SOLN
Freq: Once | INTRAVENOUS | Status: AC
  Filled 2021-01-01: qty 250

## 2021-01-01 MED ORDER — ACETAMINOPHEN 325 MG PO TABS
ORAL_TABLET | ORAL | Status: AC
  Filled 2021-01-01: qty 2

## 2021-01-01 MED ORDER — ACETAMINOPHEN 325 MG PO TABS
650.0000 mg | ORAL_TABLET | Freq: Once | ORAL | Status: AC
  Administered 2021-01-01: 650 mg via ORAL

## 2021-01-01 MED ORDER — SODIUM CHLORIDE 0.9% FLUSH
10.0000 mL | INTRAVENOUS | Status: DC | PRN
  Administered 2021-01-01: 3 mL
  Filled 2021-01-01: qty 10

## 2021-01-01 NOTE — Patient Instructions (Signed)
Cancer Center Discharge Instructions for Patients Receiving Chemotherapy  Today you received the following chemotherapy agents: trastuzumab-dkst (Ogivri)  To help prevent nausea and vomiting after your treatment, we encourage you to take your nausea medication as directed.   If you develop nausea and vomiting that is not controlled by your nausea medication, call the clinic.   BELOW ARE SYMPTOMS THAT SHOULD BE REPORTED IMMEDIATELY:  *FEVER GREATER THAN 100.5 F  *CHILLS WITH OR WITHOUT FEVER  NAUSEA AND VOMITING THAT IS NOT CONTROLLED WITH YOUR NAUSEA MEDICATION  *UNUSUAL SHORTNESS OF BREATH  *UNUSUAL BRUISING OR BLEEDING  TENDERNESS IN MOUTH AND THROAT WITH OR WITHOUT PRESENCE OF ULCERS  *URINARY PROBLEMS  *BOWEL PROBLEMS  UNUSUAL RASH Items with * indicate a potential emergency and should be followed up as soon as possible.  Feel free to call the clinic should you have any questions or concerns. The clinic phone number is [phone].  Please show the CHEMO ALERT CARD at check-in to the Emergency Department and triage nurse.   

## 2021-01-01 NOTE — Patient Instructions (Signed)

## 2021-01-01 NOTE — Progress Notes (Signed)
EKG:  07/08/20 CXR:  02/25/17 ECHO:  12/31/20 Stress Test:  Denies Cardiac Cath:  Denies  Fasting Blood Sugar-  N/A Checks Blood Sugar_N/A__ times a day  OSA/CPAP:  No  ASA/Blood Thinners:  No  Covid test 12/30/20  Anesthesia Review:  No  Patient denies shortness of breath, fever, cough, and chest pain at PAT appointment.  Patient verbalized understanding of instructions provided today at the PAT appointment.  Patient asked to review instructions at home and day of surgery.

## 2021-01-01 NOTE — Progress Notes (Signed)
Per Cassie Heilingoetter, PA, ok to treat with platelets of 85.

## 2021-01-01 NOTE — Progress Notes (Signed)
Oncology: Dr. [name]  60 y.o. with history of metastatic breast cancer was referred by Dr. [name] for cardio-oncology evaluation. ER+/PR+/HER2+ left breast cancer diagnosed 7/16.  She had left mastectomy, positive nodes.  She was later found to have bony mets.  6 cycles docetaxol, Herceptin/Perjeta. She had radiation in 2017.  She is now on long-term Herceptin.  She has had serial echoes while on Herceptin.  Echo in 8/21 showed normal EF 60-65%, but GLS had become less negative, -19.1% => -13.5%.   Echo in 9/21 showed EF 55-60% with GLS -15.4% and normal diastolic function (technically difficult study).  Echo was done today and reviewed, EF 60-65%, GLS -62.2%, normal diastolic function.   She has no cardiac history.  She has been a smoker in the past but quit.  No exertional dyspnea or chest pain.  No lightheadedness or palpitations.   Labs (8/21): LDL 163 Labs (11/21): LDL 71 Labs (1/22): creatinine 1.04  PMH: 1. Breast cancer: ER+/PR+/HER2+ left breast cancer diagnosed 7/16.  She had left mastectomy, positive nodes.  She was later found to have bony mets.   - 6 cycles docetaxol, Herceptin/Perjeta.  - Radiation 2017 - Long-term Herceptin.  - Echo (8/21): EF 60-65%, GLS -13.5%, RV normal. - Echo (9/21): EF 29-79%, normal diastolic function, GLS -89.2%, normal RV.  - Echo (1/22): EF 60-65%, GLS -11.9%, normal diastolic function, normal RV.  2. Bipolar disorder 3. Smoker: Quit in 8/21.  4. Hyperlipidemia  FH: Father with CABG around age 54.   Social History   Socioeconomic History  . Marital status: Single    Spouse name: Not on file  . Number of children: Not on file  . Years of education: Not on file  . Highest education level: Not on file  Occupational History  . Not on file  Tobacco Use  . Smoking status: Former Smoker    Packs/day: 1.00    Years: 43.00    Pack years: 43.00    Types: Cigarettes  . Smokeless tobacco: Never Used  . Tobacco comment: 3 day since last  cigarette  Vaping Use  . Vaping Use: Some days  Substance and Sexual Activity  . Alcohol use: No    Comment: no ETOH since 08/22/12  . 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 Sexual activity: Never    Birth control/protection: Other-see comments    Comment: ablation  Other Topics Concern  . Not on file  Social History Narrative  . Not on file   Social Determinants of Health   Financial Resource Strain: Not on file  Food Insecurity: Not on file  Transportation Needs: Not on file  Physical Activity: Not on file  Stress: Not on file  Social Connections: Not on file  Intimate Partner Violence: Not on file   ROS: All systems reviewed and negative except as per HPI.   Current Outpatient Medications  Medication Sig Dispense Refill  . acetaminophen (TYLENOL) 500 MG tablet Take 2 tablets (1,000 mg total) by mouth every 8 (eight) hours as needed for moderate pain. 100 tablet 6  . anastrozole (ARIMIDEX) 1 MG tablet Take 1 tablet (1 mg total) by mouth daily. 90 tablet 1  . azelastine (ASTELIN) 0.1 % nasal spray Place 2 sprays into both nostrils 2 (two) times daily. Use in each nostril as directed 30 mL 12  . carvedilol (COREG) 3.125 MG tablet TAKE 1 TABLET (3.125 MG TOTAL) BY MOUTH 2 TIMES DAILY FOR 10  DAYS, THEN 2 TABLETS 2 TIMES DAILY. 120 tablet 3  . cholecalciferol (VITAMIN D3) 25 MCG (1000 UNIT) tablet Take 2,000 Units by mouth daily.    [name] docusate sodium (COLACE) 100 MG capsule Take 1 capsule (100 mg total) by mouth daily. 100 capsule 6  . fluticasone (FLONASE) 50 MCG/ACT nasal spray SPRAY 2 SPRAYS INTO EACH NOSTRIL EVERY DAY 16 mL 2  . ibuprofen (ADVIL) 800 MG tablet Take 1 tablet (800 mg total) by mouth 2 (two) times daily as needed for moderate pain. Take with food. 60 tablet 6  . lamoTRIgine (LAMICTAL) 100 MG tablet Take 100 mg by mouth every morning.    . loratadine (CLARITIN) 10 MG tablet Take 1 tablet (10 mg total) by mouth daily. 90 tablet 3   . Lurasidone HCl 60 MG TABS Take 60 mg by mouth daily.    . ondansetron (ZOFRAN) 8 MG tablet TAKE 1 TABLET (8 MG TOTAL) BY MOUTH EVERY 8 (EIGHT) HOURS AS NEEDED FOR NAUSEA OR VOMITING. (Patient taking differently: Take 8 mg by mouth 2 (two) times daily.) 90 tablet 1  . pantoprazole (PROTONIX) 40 MG tablet Take 1 tablet (40 mg total) by mouth daily. 90 tablet 3  . polyethylene glycol (MIRALAX / GLYCOLAX) 17 g packet Take 17 g by mouth daily as needed for mild constipation. 30 each 6  . rosuvastatin (CRESTOR) 10 MG tablet Take 1 tablet (10 mg total) by mouth daily. 90 tablet 3  . traZODone (DESYREL) 100 MG tablet Take 1 tablet (100 mg total) by mouth at bedtime. 30 tablet 0   No current facility-administered medications for this encounter.   BP 104/60   Pulse 71   Wt 72.8 kg (160 lb 6.4 oz)   LMP  (LMP Unknown) Comment: ablation  SpO2 98%   BMI 26.69 kg/m  General: NAD Neck: No JVD, no thyromegaly or thyroid nodule.  Lungs: Clear to auscultation bilaterally with normal respiratory effort. CV: Nondisplaced PMI.  Heart regular S1/S2, no S3/S4, no murmur.  No peripheral edema.  No carotid bruit.  Normal pedal pulses.  Abdomen: Soft, nontender, no hepatosplenomegaly, no distention.  Skin: Intact without lesions or rashes.  Neurologic: Alert and oriented x 3.  Psych: Normal affect. Extremities: No clubbing or cyanosis.  HEENT: Normal.   Assessment/Plan: 1. Breast cancer: Metastatic, now on long-term Herceptin.  She has been getting serial echoes since 2016, they have all showed normal EF.  However, echo in 8/21 showed less negative GLS, with change from -19.1% => -13.5%.  This fall in strain was concerning as it could augur future fall in EF.  However, given prior echoes that have all been normal, I was concerned that the change may have just been related to technique (difficult study).  Echo in 9/21 EF 55-60% with GLS mildly more negative at -15.4%.  Technically difficult study, hard to track  endocardium for strain images. Echo today showed EF 60-65% with strain up to -22.8%.   - With soft BP and normal LV strain, I am going to let her stop Coreg.   - She will continue Herceptin long-term.  - Echo in 6 months.  2. Hyperlipidemia: Very high LDL in 8/21.  She is now on Crestor, LDL much better in 11/21.   Loralie Champagne 01/01/2021

## 2021-01-02 ENCOUNTER — Ambulatory Visit (HOSPITAL_COMMUNITY): Payer: Medicaid Other | Admitting: Anesthesiology

## 2021-01-02 ENCOUNTER — Encounter (HOSPITAL_COMMUNITY): Payer: Self-pay

## 2021-01-02 ENCOUNTER — Telehealth: Payer: Self-pay | Admitting: Physician Assistant

## 2021-01-02 ENCOUNTER — Encounter (HOSPITAL_COMMUNITY): Admission: RE | Disposition: A | Discharge status: Home / Self Care | Payer: Self-pay | Source: Home / Self Care

## 2021-01-02 ENCOUNTER — Ambulatory Visit (HOSPITAL_COMMUNITY)
Admission: RE | Admit: 2021-01-02 | Discharge: 2021-01-02 | Disposition: A | Discharge status: Home / Self Care | Payer: Medicaid Other | Attending: Oncology | Admitting: Oncology

## 2021-01-02 ENCOUNTER — Ambulatory Visit (HOSPITAL_COMMUNITY): Admission: RE | Admit: 2021-01-02 | Payer: Medicaid Other | Source: Ambulatory Visit

## 2021-01-02 ENCOUNTER — Ambulatory Visit (HOSPITAL_COMMUNITY)
Admission: RE | Admit: 2021-01-02 | Discharge: 2021-01-02 | Disposition: A | Discharge status: Home / Self Care | Payer: Medicaid Other | Source: Ambulatory Visit | Attending: Oncology | Admitting: Oncology

## 2021-01-02 ENCOUNTER — Other Ambulatory Visit: Payer: Self-pay

## 2021-01-02 DIAGNOSIS — Z853 Personal history of malignant neoplasm of breast: Secondary | ICD-10-CM | POA: Diagnosis not present

## 2021-01-02 DIAGNOSIS — C770 Secondary and unspecified malignant neoplasm of lymph nodes of head, face and neck: Secondary | ICD-10-CM

## 2021-01-02 DIAGNOSIS — C799 Secondary malignant neoplasm of unspecified site: Secondary | ICD-10-CM | POA: Diagnosis present

## 2021-01-02 HISTORY — PX: RADIOLOGY WITH ANESTHESIA: SHX6223

## 2021-01-02 SURGERY — MRI WITH ANESTHESIA
Anesthesia: General

## 2021-01-02 MED ORDER — PROPOFOL 10 MG/ML IV BOLUS
INTRAVENOUS | Status: DC | PRN
  Administered 2021-01-02: 140 mg via INTRAVENOUS

## 2021-01-02 MED ORDER — DEXAMETHASONE SODIUM PHOSPHATE 10 MG/ML IJ SOLN
INTRAMUSCULAR | Status: DC | PRN
  Administered 2021-01-02: 5 mg via INTRAVENOUS

## 2021-01-02 MED ORDER — ORAL CARE MOUTH RINSE: 15.0000 mL | Freq: Once | OROMUCOSAL | Status: DC

## 2021-01-02 MED ORDER — LIDOCAINE 2% (20 MG/ML) 5 ML SYRINGE
INTRAMUSCULAR | Status: DC | PRN
  Administered 2021-01-02: 40 mg via INTRAVENOUS

## 2021-01-02 MED ORDER — CHLORHEXIDINE GLUCONATE 0.12 % MT SOLN
15.0000 mL | Freq: Once | OROMUCOSAL | Status: DC
  Filled 2021-01-02: qty 15

## 2021-01-02 MED ORDER — FENTANYL CITRATE (PF) 100 MCG/2ML IJ SOLN: 25.0000 ug | INTRAMUSCULAR | Status: DC | PRN

## 2021-01-02 MED ORDER — LACTATED RINGERS IV SOLN: INTRAVENOUS | Status: DC

## 2021-01-02 MED ORDER — GADOBUTROL 1 MMOL/ML IV SOLN
7.0000 mL | Freq: Once | INTRAVENOUS | Status: AC | PRN
  Administered 2021-01-02: 7 mL via INTRAVENOUS

## 2021-01-02 MED ORDER — ONDANSETRON HCL 4 MG/2ML IJ SOLN: 4.0000 mg | Freq: Once | INTRAMUSCULAR | Status: DC | PRN

## 2021-01-02 MED ORDER — ROCURONIUM BROMIDE 10 MG/ML (PF) SYRINGE
PREFILLED_SYRINGE | INTRAVENOUS | Status: DC | PRN
  Administered 2021-01-02: 30 mg via INTRAVENOUS
  Administered 2021-01-02: 50 mg via INTRAVENOUS
  Administered 2021-01-02: 20 mg via INTRAVENOUS

## 2021-01-02 MED ORDER — ONDANSETRON HCL 4 MG/2ML IJ SOLN
INTRAMUSCULAR | Status: DC | PRN
  Administered 2021-01-02: 4 mg via INTRAVENOUS

## 2021-01-02 MED ORDER — MIDAZOLAM HCL 2 MG/2ML IJ SOLN
INTRAMUSCULAR | Status: DC | PRN
  Administered 2021-01-02: 2 mg via INTRAVENOUS

## 2021-01-02 MED ORDER — SUGAMMADEX SODIUM 200 MG/2ML IV SOLN
INTRAVENOUS | Status: DC | PRN
  Administered 2021-01-02: 400 mg via INTRAVENOUS

## 2021-01-02 NOTE — Telephone Encounter (Signed)
Scheduled appointments per 2/2 los. Left message for patient with appointments date and times.

## 2021-01-02 NOTE — Transfer of Care (Signed)
Immediate Anesthesia Transfer of Care Note  Patient: [name]  Procedure(s) Performed: MRI WITH ANESTHESIA  BRAIN WITH AND WITHOUT CONTRAST,TOTAL SPINE MET SCREENING (N/A )  Patient Location: PACU  Anesthesia Type:General  Level of Consciousness: drowsy, patient cooperative and responds to stimulation  Airway & Oxygen Therapy: Patient Spontanous Breathing  Post-op Assessment: Report given to RN and Post -op Vital signs reviewed and stable  Post vital signs: Reviewed and stable  Last Vitals:  Vitals Value Taken Time  BP 77/40 01/02/21 1253  Temp    Pulse 81 01/02/21 1254  Resp 14 01/02/21 1254  SpO2 92 % 01/02/21 1254  Vitals shown include unvalidated device data.  Last Pain:  Vitals:   01/02/21 0928  TempSrc:   PainSc: 0-No pain      Patients Stated Pain Goal: 3 (01/02/21 0928)  Complications: No complications documented. 

## 2021-01-02 NOTE — Anesthesia Preprocedure Evaluation (Signed)
Anesthesia Evaluation  Patient identified by MRN, date of birth, ID band Patient awake    Reviewed: Allergy & Precautions, NPO status , Patient's Chart, lab work & pertinent test results  Airway Mallampati: II  TM Distance: >3 FB Neck ROM: Full    Dental  (+) Teeth Intact, Dental Advisory Given   Pulmonary former smoker,    breath sounds clear to auscultation       Cardiovascular hypertension,  Rhythm:Regular Rate:Normal     Neuro/Psych    GI/Hepatic   Endo/Other    Renal/GU      Musculoskeletal   Abdominal   Peds  Hematology   Anesthesia Other Findings   Reproductive/Obstetrics                             Anesthesia Physical Anesthesia Plan  ASA: III  Anesthesia Plan: General   Post-op Pain Management:    Induction: Intravenous  PONV Risk Score and Plan: Ondansetron and Dexamethasone  Airway Management Planned: Oral ETT  Additional Equipment:   Intra-op Plan:   Post-operative Plan: Extubation in OR  Informed Consent: I have reviewed the patients History and Physical, chart, labs and discussed the procedure including the risks, benefits and alternatives for the proposed anesthesia with the patient or authorized representative who has indicated his/her understanding and acceptance.     Dental advisory given  Plan Discussed with: CRNA and Anesthesiologist  Anesthesia Plan Comments:         Anesthesia Quick Evaluation  

## 2021-01-02 NOTE — Anesthesia Postprocedure Evaluation (Signed)
Anesthesia Post Note  Patient: [name]  Procedure(s) Performed: MRI WITH ANESTHESIA  BRAIN WITH AND WITHOUT CONTRAST,TOTAL SPINE MET SCREENING (N/A )     Patient location during evaluation: PACU Anesthesia Type: General Level of consciousness: awake and alert Pain management: pain level controlled Vital Signs Assessment: post-procedure vital signs reviewed and stable Respiratory status: spontaneous breathing, nonlabored ventilation, respiratory function stable and patient connected to nasal cannula oxygen Cardiovascular status: blood pressure returned to baseline and stable Postop Assessment: no apparent nausea or vomiting Anesthetic complications: no   No complications documented.  Last Vitals:  Vitals:   01/02/21 1255 01/02/21 1308  BP: 113/78 131/69  Pulse: 79 75  Resp: 10 18  Temp: (!) 36.2 C (!) 36.2 C  SpO2: 95% 95%    Last Pain:  Vitals:   01/02/21 1255  TempSrc:   PainSc: 0-No pain                 Tedd Cottrill COKER

## 2021-01-02 NOTE — Anesthesia Procedure Notes (Signed)
Procedure Name: Intubation Date/Time: 01/02/2021 11:12 AM Performed by: Janace Litten, CRNA Pre-anesthesia Checklist: Patient identified, Emergency Drugs available, Suction available and Patient being monitored Patient Re-evaluated:Patient Re-evaluated prior to induction Oxygen Delivery Method: Circle System Utilized Preoxygenation: Pre-oxygenation with 100% oxygen Induction Type: IV induction Ventilation: Mask ventilation without difficulty Laryngoscope Size: Mac and 3 Grade View: Grade II Tube type: Oral Tube size: 7.0 mm Number of attempts: 1 Airway Equipment and Method: Stylet Placement Confirmation: ETT inserted through vocal cords under direct vision,  positive ETCO2 and breath sounds checked- equal and bilateral Secured at: 20 cm Tube secured with: Tape Dental Injury: Teeth and Oropharynx as per pre-operative assessment  Comments: Grade II view likely due to head and neck positioning in MRI head holder

## 2021-01-03 ENCOUNTER — Other Ambulatory Visit: Payer: Self-pay | Admitting: Family Medicine

## 2021-01-03 ENCOUNTER — Telehealth: Payer: Self-pay | Admitting: Family Medicine

## 2021-01-03 ENCOUNTER — Encounter (HOSPITAL_COMMUNITY): Payer: Self-pay | Admitting: Radiology

## 2021-01-03 DIAGNOSIS — J302 Other seasonal allergic rhinitis: Secondary | ICD-10-CM

## 2021-01-03 NOTE — Telephone Encounter (Unsigned)
Copied from [place] [id number]. Topic: Quick Communication - Rx Refill/Question >> Jan 03, 2021  9:08 AM Tessa Lerner A wrote: Medication: cyclobenzaprine ([place]) 60 M  Has the patient contacted their pharmacy? No. Patient has not contacted pharmacy.   Preferred Pharmacy (with phone number or street name): [id number] - [place], [place]  Phone: [phone]  Agent: Please be advised that RX refills may take up to 3 business days. We ask that you follow-up with your pharmacy.

## 2021-01-03 NOTE — Telephone Encounter (Signed)
Please review for refill. Refill not delegated per protocol. Listed as historical med. Next OV: 01/28/21

## 2021-01-03 NOTE — Telephone Encounter (Signed)
Pt requesting refill on Flexeril.

## 2021-01-05 MED ORDER — CYCLOBENZAPRINE HCL 10 MG PO TABS: 10.0000 mg | ORAL_TABLET | Freq: Two times a day (BID) | ORAL | 2 refills | Status: DC

## 2021-01-05 NOTE — Telephone Encounter (Signed)
Done

## 2021-01-06 ENCOUNTER — Encounter: Payer: Self-pay | Admitting: Oncology

## 2021-01-06 NOTE — Telephone Encounter (Signed)
Pt was called and informed of medication being sent to pharmacy. 

## 2021-01-07 ENCOUNTER — Other Ambulatory Visit: Payer: Self-pay | Admitting: Oncology

## 2021-01-07 ENCOUNTER — Inpatient Hospital Stay (HOSPITAL_BASED_OUTPATIENT_CLINIC_OR_DEPARTMENT_OTHER): Payer: Medicaid Other | Admitting: Internal Medicine

## 2021-01-07 DIAGNOSIS — C7931 Secondary malignant neoplasm of brain: Secondary | ICD-10-CM

## 2021-01-07 NOTE — Progress Notes (Signed)
I connected with [name] on 01/07/21 at 11:30 AM EST by telephone visit and verified that I am speaking with the correct person using two identifiers.  I discussed the limitations, risks, security and privacy concerns of performing an evaluation and management service by telemedicine and the availability of in-person appointments. I also discussed with the patient that there may be a patient responsible charge related to this service. The patient expressed understanding and agreed to proceed.  Other persons participating in the visit and their role in the encounter:  n/a  Patient's location:  Home  Provider's location:  Office  Chief Complaint:  Brain Metastases  History of Present Ilness: [name] describes no new or progressive neurologic deficits.  No reports of further headaches.  Denies seizures.  Continues to follow with Dr. [name]. Observations: Cognition and language normal  Imaging:  [facility] Clinician Interpretation: I have personally reviewed the CNS images as listed.  My interpretation, in the context of the patient's clinical presentation, is reduced stable disease  MR Brain W Wo Contrast  Result Date: 01/03/2021 CLINICAL DATA:  Metastatic breast cancer. Follow-up response to treatment. EXAM: MRI HEAD WITHOUT AND WITH CONTRAST TECHNIQUE: Multiplanar, multiecho pulse sequences of the brain and surrounding structures were obtained without and with intravenous contrast. CONTRAST:  45mL GADAVIST GADOBUTROL 1 MMOL/ML IV SOLN COMPARISON:  MRI head 05/21/2020 FINDINGS: Brain: Generalized atrophy and extensive white matter T2 hyperintensity appears unchanged. Few small areas of chronic microhemorrhage in the brain. 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 No recurrent tumor. Dural thickening and enhancement right parietal lobe with mild progression. This is site of prior subdural hematoma. Enhancement is smooth and without nodularity. No enhancing brain  metastasis identified. Negative for acute infarct. Vascular: Normal arterial flow voids. Skull and upper cervical spine: No skeletal lesion identified. Sinuses/Orbits: Paranasal sinuses clear. Bilateral cataract extraction. Other: None IMPRESSION: No recurrent tumor at the site of left cerebellar tumor resection. No new  metastatic deposits in the brain Dural thickening enhancement right parietal lobe with mild progression. This is the site of prior subdural hematoma. Continued follow-up recommended. Electronically Signed   By: Franchot Gallo M.D.   On: 01/03/2021 13:47   ECHOCARDIOGRAM COMPLETE  Result Date: 12/31/2020    ECHOCARDIOGRAM REPORT   Patient Name:   [name] Date of Exam: 12/31/2020 Medical Rec #:  [id number]    Height:       65.0 in Accession #:    [id number]   Weight:       156.4 lb Date of Birth:  Mar 17, 1961    BSA:          1.782 m Patient Age:    60 years     BP:           104/60 mmHg Patient Gender: F            HR:           70 bpm. Exam Location:  Outpatient Procedure: 2D Echo Indications:    Chemotherapy evaluation v87.41  History:        Patient has prior history of Echocardiogram examinations, most                 recent 08/20/2020. Breast Cancer; Risk Factors:Hypertension and                 Dyslipidemia.  Sonographer:    Mikki Santee RDCS (AE) Referring Phys: [place]  Sonographer Comments: Technically difficult study due  to poor echo windows. Image acquisition challenging due to breast implants. IMPRESSIONS  1. Left ventricular ejection fraction, by estimation, is 60 to 65%. The left ventricle has normal function. The left ventricle has no regional wall motion abnormalities. Left ventricular diastolic parameters were normal. GLS normal, -22.8%.  2. Right ventricular systolic function is normal. The right ventricular size is normal. Tricuspid regurgitation signal is inadequate for assessing PA pressure.  3. The mitral valve is normal in structure. No evidence of mitral  valve regurgitation. No evidence of mitral stenosis.  4. The aortic valve is tricuspid. Aortic valve regurgitation is not visualized. No aortic stenosis is present.  5. The inferior vena cava is normal in size with greater than 50% respiratory variability, suggesting right atrial pressure of 3 mmHg. FINDINGS  Left Ventricle: Left ventricular ejection fraction, by estimation, is 60 to 65%. The left ventricle has normal function. The left ventricle has no regional wall motion abnormalities. The left ventricular internal cavity size was normal in size. There is  no left ventricular hypertrophy. Left ventricular diastolic parameters were normal. Right Ventricle: The right ventricular size is normal. No increase in right ventricular wall thickness. Right ventricular systolic function is normal. Tricuspid regurgitation signal is inadequate for assessing PA pressure. Left Atrium: Left atrial size was normal in size. Right Atrium: Right atrial size was normal in size. Pericardium: There is no evidence of pericardial effusion. Mitral Valve: The mitral valve is normal in structure. No evidence of mitral valve regurgitation. No evidence of mitral valve stenosis. Tricuspid Valve: The tricuspid valve is normal in structure. Tricuspid valve regurgitation is not demonstrated. Aortic Valve: The aortic valve is tricuspid. Aortic valve regurgitation is not visualized. No aortic stenosis is present. Pulmonic Valve: The pulmonic valve was normal in structure. Pulmonic valve regurgitation is not visualized. Aorta: The aortic root is normal in size and structure. Venous: The inferior vena cava is normal in size with greater than 50% respiratory variability, suggesting right atrial pressure of 3 mmHg. IAS/Shunts: No atrial level shunt detected by color flow Doppler.  LEFT VENTRICLE PLAX 2D LVIDd:         3.90 cm  Diastology LVIDs:         2.60 cm  LV e' medial:    8.38 cm/s LV PW:         1.10 cm  LV E/e' medial:  8.2 LV IVS:        1.10  cm  LV e' lateral:   11.60 cm/s LVOT diam:     2.00 cm  LV E/e' lateral: 5.9 LV SV:         62 LV SV Index:   35 LVOT Area:     3.14 cm  RIGHT VENTRICLE RV S prime:     9.03 cm/s TAPSE (M-mode): 1.8 cm LEFT ATRIUM             Index       RIGHT ATRIUM          Index LA diam:        3.30 cm 1.85 cm/m  RA Area:     9.59 cm LA Vol (A2C):   35.6 ml 19.98 ml/m RA Volume:   20.00 ml 11.22 ml/m LA Vol (A4C):   27.5 ml 15.43 ml/m LA Biplane Vol: 33.2 ml 18.63 ml/m  AORTIC VALVE LVOT Vmax:   86.20 cm/s LVOT Vmean:  55.500 cm/s LVOT VTI:    0.197 m  AORTA Ao Root diam: 2.70 cm MITRAL VALVE  MV Area (PHT): 2.95 cm    SHUNTS MV Decel Time: 257 msec    Systemic VTI:  0.20 m MV E velocity: 68.90 cm/s  Systemic Diam: 2.00 cm MV A velocity: 59.60 cm/s MV E/A ratio:  1.16 Loralie Champagne MD Electronically signed by Loralie Champagne MD Signature Date/Time: 12/31/2020/12:28:54 PM    Final    MR TOTAL SPINE METS SCREENING  Result Date: 01/03/2021 CLINICAL DATA:  Breast cancer staging. EXAM: MRI TOTAL SPINE WITHOUT AND WITH CONTRAST TECHNIQUE: Multisequence MR imaging of the spine from the cervical spine to the sacrum was performed prior to and following IV contrast administration for evaluation of spinal metastatic disease. CONTRAST:  79mL GADAVIST GADOBUTROL 1 MMOL/ML IV SOLN COMPARISON:  CT chest 06/10/2020. FINDINGS: MRI CERVICAL SPINE FINDINGS Alignment: Normal Vertebrae: Vertebral body heights are maintained. No evidence of marrow replacing lesion. Small inferior C6 degenerative Schmorl's node. No abnormal enhancement. Cord: Normal cord signal.  No evidence of abnormal enhancement. Posterior Fossa fossa and paraspinal tissues: Limited evaluation given only imaged on sagittals without overt abnormality. Disc levels: Small posterior disc osteophyte complexes at C4-C5, C5-C6 and C6-C7 which partially efface ventral CSF without significant canal stenosis. Evaluation of the foramina is limited given absence of axial sequences on this  protocol. MRI THORACIC SPINE FINDINGS Alignment:  Normal. Vertebrae: Nonenhancing, T1 hypointense and STIR hyperintense T9 lesion, compatible with known bone metastasis. No other enhancing lesions identified. Vertebral body heights are maintained. Cord:  Normal cord signal.  No evidence of abnormal enhancement. Paraspinal and other soft tissues: Limited evaluation given only imaged on sagittals without overt abnormality. Disc levels: Mild multilevel degenerative change without significant canal. No evidence of high-grade foraminal stenosis although of the foramina is limited given absence of axial sequences on this protocol. MRI LUMBAR SPINE FINDINGS Segmentation:  Standard. Alignment:  Normal. Vertebrae: Vertebral body heights are maintained. No focal marrow replacing lesions identified. No abnormal enhancement. Conus medullaris: Extends to the L1-L2 level and appears normal. No abnormal enhancement. Paraspinal and other soft tissues: Unremarkable. Disc levels: There are small broad disc bulges at L4-L5 and L5-S1 and mild lower lumbar facet hypertrophy. No significant canal stenosis. No evidence of high-grade foraminal stenosis although of the foramina is limited given limited screening protocol. IMPRESSION: 1. Nonenhancing T9 lesion, compatible with known bony metastasis. Additional sclerotic lesions seen on prior CT studies are not well visualized by MRI. No enhancing osseous metastatic disease. 2. No evidence of pathologic fracture or epidural involvement. 3. Mild multilevel degenerative change without significant canal stenosis, as detailed above. Electronically Signed   By: Margaretha Sheffield MD   On: 01/03/2021 14:20   DG Hip Unilat W or W/O Pelvis 1 View Right  Result Date: 12/09/2020 CLINICAL DATA:  RIGHT hip pain posteriorly, history breast cancer EXAM: DG HIP (WITH OR WITHOUT PELVIS) 1V RIGHT COMPARISON:  09/11/2020 Correlation: Bone scan 06/10/2020, PET-CT 01/19/2019 FINDINGS: Osseous mineralization  normal. Hip and SI joint spaces preserved. No acute fracture or dislocation. Unchanged since 09/11/2020 1.5 cm diameter sclerotic focus at the LEFT iliac bone unchanged since 09/11/2020. 2.5 x 2.3 cm diameter vague area of sclerosis within RIGHT iliac bone, unchanged since PET-CT. No additional focal areas of sclerosis or lucency identified. IMPRESSION: Stable sclerotic lesions within the iliac bones bilaterally. No new osseous findings. Electronically Signed   By: Lavonia Dana M.D.   On: 12/09/2020 11:34    Assessment and Plan: Clinically and radiographically stable.    We ask that Wiletta Bermingham return to clinic in  9 months following next brain MRI, or sooner as needed.  I discussed the assessment and treatment plan with the patient.  The patient was provided an opportunity to ask questions and all were answered.  The patient agreed with the plan and demonstrated understanding of the instructions.    The patient was advised to call back or seek an in-person evaluation if the symptoms worsen or if the condition fails to improve as anticipated.  Ventura Sellers, MD   I provided 15 minutes of non face-to-face telephone visit time during this encounter, and > 50% was spent counseling as documented under my assessment & plan.

## 2021-01-13 ENCOUNTER — Telehealth: Payer: Self-pay

## 2021-01-13 NOTE — Telephone Encounter (Signed)
Returned call to [name]. [name] states she is supposed to have a CT scan of the chest and it has not been ordered and it is past due. [name] informed that Dr. [name] would be notified .

## 2021-01-15 ENCOUNTER — Telehealth: Payer: Self-pay

## 2021-01-15 DIAGNOSIS — C50119 Malignant neoplasm of central portion of unspecified female breast: Secondary | ICD-10-CM

## 2021-01-15 DIAGNOSIS — C7931 Secondary malignant neoplasm of brain: Secondary | ICD-10-CM

## 2021-01-15 NOTE — Telephone Encounter (Signed)
RN spoke with patient regarding concerns about CT Chest.  Pt verbalized she usually has scheduled every 6 months.   RN reviewed with MD - Verbal orders given for CT Chest W Contrast.  Orders placed, awaiting authorization.

## 2021-01-15 NOTE — Telephone Encounter (Signed)
err

## 2021-01-16 ENCOUNTER — Telehealth: Payer: Self-pay

## 2021-01-16 NOTE — Telephone Encounter (Signed)
Contacted pt to let her know date and time of CT scan. 01/20/21 at 2:15 pm. Liquids only for 4 hours prior. Pt verbalized understanding.

## 2021-01-20 ENCOUNTER — Other Ambulatory Visit: Payer: Self-pay

## 2021-01-20 ENCOUNTER — Ambulatory Visit (HOSPITAL_COMMUNITY)
Admission: RE | Admit: 2021-01-20 | Discharge: 2021-01-20 | Disposition: A | Discharge status: Home / Self Care | Payer: Medicaid Other | Source: Ambulatory Visit | Attending: Oncology | Admitting: Oncology

## 2021-01-20 DIAGNOSIS — C50119 Malignant neoplasm of central portion of unspecified female breast: Secondary | ICD-10-CM

## 2021-01-20 DIAGNOSIS — C7931 Secondary malignant neoplasm of brain: Secondary | ICD-10-CM

## 2021-01-20 MED ORDER — IOHEXOL 300 MG/ML  SOLN
75.0000 mL | Freq: Once | INTRAMUSCULAR | Status: AC | PRN
  Administered 2021-01-20: 75 mL via INTRAVENOUS

## 2021-01-21 ENCOUNTER — Telehealth: Payer: Self-pay

## 2021-01-21 NOTE — Telephone Encounter (Signed)
Pt contacted to let her know that her CT results were available in my Chart. Reported to pt no changes were noted. Pt acknowledged results.

## 2021-01-22 ENCOUNTER — Other Ambulatory Visit: Payer: Self-pay | Admitting: Oncology

## 2021-01-22 NOTE — Progress Notes (Addendum)
[facility] [place]  [phone] [phone]     ID: [name] DOB: May 20, 1961  MR#: [id number]  JJH#:[id number]  Patient Care Team: Charlott Rakes, MD as PCP - General (Family Medicine) Larey Dresser, MD as PCP - Advanced Heart Failure (Cardiology) Kyung Rudd, MD as Consulting Physician (Radiation Oncology) Erline Levine, MD as Consulting Physician (Neurosurgery) Corena Pilgrim, MD as Consulting Physician (Psychiatry) Mickeal Skinner Acey Lav, MD as Consulting Physician (Psychiatry) Nehemiah Settle, MD as Referring Physician (Plastic Surgery) Irene Limbo, MD as Consulting Physician (Plastic Surgery) Dillingham, Loel Lofty, DO as Attending Physician (Plastic Surgery) Magrinat, Virgie Dad, MD as Consulting Physician (Oncology)   CHIEF COMPLAINT: Metastatic triple positive breast cancer  CURRENT TREATMENT: trastuzumab [name] 21 days], [denosumab/Xgeva (Q6W)], anastrozole   INTERVAL HISTORY: [name] returns today for follow up and treatment of her metastatic triple positive breast cancer.   She continues on trastuzumab, given every 21 days.  We switched back to this for maintenance after she took the TDM 1 for 8 months.   [name] underwent an echocardiogram on 12/31/2020 that showed normal EF of 60-65%.    She also received denosumab/Xgeva every 6 weeks and had no symptoms or complications.  However this has been held due to a healing dental extraction. Her most recent dose was on 08/21/2020.  She continues on anastrozole with good tolerance.    She underwent MRI of the brain and total spine on 01/02/2021 under anesthesia.  No recurrent or new metastatic lesions were noted in the brain.  Her spine shows the known, healing t9 metastatic lesion, no evidence of pathologic fracture or epidural involvement, no enhancing osseous metastatic disease, and mild degeneration.    Her most recent CT chest completed 01/21/2021 was stable showing no new or progressive  metastatic disease.     REVIEW OF SYSTEMS: [name] is doing well today.  She is concerned about having a problem with her vagina, she says it is itchy and has a foul odor.  She notes her urine is dark, and she drinks an increasing amount of water and it remains dark.  She is doing well otherwise.  She notes her dental area has healed from her extraction.  She denies any new issues and a detailed ROS Was non contributory.      COVID 19 VACCINATION STATUS: Status post Pfizer x2 with booster September 2021   BREAST CANCER HISTORY: From the original intake note:  [name] was aware of a "lemon sized lump in" her left axilla for about a year before bringing it to medical attention. By then she had developed left breast and left axillary swelling (June 2016). She presented to the local emergency room and had a chest CT scan 06/06/2015 which showed a nodule in the left breast measuring 0.9 cm and questionable left axillary adenopathy. She then proceeded to bilateral diagnostic mammography and left breast ultrasonography 06/19/2015. There were no prior films for comparison (last mammography 12 years prior).. The breast density was category C. Mammography showed in the left breast upper inner quadrant a 7 cm area including a small mass and significant pleomorphic calcifications. Ultrasonography defined the mass as measuring 1.2 cm. The left axilla appeared unremarkable. There was significant skin edema.  Biopsy of the left breast mass 06/19/2015 showed (SP [id number]) an invasive ductal carcinoma, grade 2, estrogen receptor 83% positive, progesterone receptor 26% positive, and HER-2 amplified by immunohistochemstry with a 3+ reading. The patient had biopsies of a separate area in the left breast August of the  same year and this showed atypical ductal hyperplasia. (SP [id number]).  Accordingly after appropriate discussion on 08/21/2015 the patient proceeded to left mastectomy with left axillary sentinel lymph node  sampling, which, since the lymph nodes were positive, extended to the procedure to left axillary lymph node dissection. The pathology (SP [id number]) showed an invasive ductal carcinoma, grade 3, measuring in excess of 9 cm. There were also skin satellites, not contiguous with the invasive carcinoma. Margins were clear and ample. There was evidence of lymphovascular invasion. A total of 15 lymph nodes were removed, including 5 sentinel lymph nodes, all of which were positive, so that the final total was 14 out of 15 lymph nodes involved by tumor. There was evidence of extranodal extension. The final pathology was pT4b pN2a, stage IIIB  CA-27-29 and CEA 09/19/2015 were non-informative October 2016.  Unfortunately CT scans of the chest abdomen and pelvis 09/16/2015 showed bony metastases to the ri/ght scapula, left iliac crest, and also L4 and T-spine. There were questionable liver cysts which on repeat CT scan 03/02/2016 appear to be a little bit more well-defined, possibly a little larger. There were also some possible right upper lobe lung lesions.  Adjuvant treatment consisted of docetaxel, trastuzumab and pertuzumab, with the final (6th) docetaxel dose given 02/11/2016. She continues on trastuzumab and pertuzumab, with the 11th cycle given 05/05/2016. Echocardiogram 02/26/2016 showed an ejection fraction of 55%. She receives denosumab/Xgeva every 4 weeks.. She also receives radiation, started 06/09/201, to be completed 06/26/2016.  Her subsequent history is as detailed below   PAST MEDICAL HISTORY: Past Medical History:  Diagnosis Date  . Alcohol abuse    none since 2013  . Anemia    during chemo  . Anxiety    At age 16  . Arthritis Dx 2010  . Bipolar disorder ([place])   . Bronchitis   . Cancer ([place])    breast mets to brain  . CHF (congestive heart failure) ([place])   . Chronic pain    resolved per patient 12/25/19  . Complication of anesthesia   . Depression   . Family history of adverse  reaction to anesthesia    MOther had PONV  . GERD (gastroesophageal reflux disease)   . Headache    hx  migraines  . HLD (hyperlipidemia)   . Hypertension   . Lymphedema of left arm   . Opiate dependence ([place])   . PONV (postoperative nausea and vomiting)   . Port-A-Cath in place   . PTSD (post-traumatic stress disorder)   . S/P endometrial ablation    in md's office    PAST SURGICAL HISTORY: Past Surgical History:  Procedure Laterality Date  . APPLICATION OF CRANIAL NAVIGATION N/A 08/14/2016   Procedure: APPLICATION OF CRANIAL NAVIGATION;  Surgeon: Erline Levine, MD;  Location: [facility];  Service: Neurosurgery;  Laterality: N/A;  . BREAST RECONSTRUCTION Left    with silicone implant  . COLONOSCOPY W/ POLYPECTOMY    . CRANIOTOMY N/A 08/14/2016   Procedure: CRANIOTOMY TUMOR EXCISION WITH Lucky Rathke;  Surgeon: Erline Levine, MD;  Location: [facility];  Service: Neurosurgery;  Laterality: N/A;  . FIBULA FRACTURE SURGERY Left   . MASTECTOMY Left   . RADIOLOGY WITH ANESTHESIA N/A 07/23/2016   Procedure: MRI OF BRAIN WITH AND WITHOUT;  Surgeon: Medication Radiologist, MD;  Location: [facility];  Service: Radiology;  Laterality: N/A;  . RADIOLOGY WITH ANESTHESIA N/A 09/08/2016   Procedure: MRI OF BRAIN WITH AND WITHOUT CONTRAST;  Surgeon: Medication Radiologist, MD;  Location:  [facility];  Service: Radiology;  Laterality: N/A;  . RADIOLOGY WITH ANESTHESIA N/A 12/10/2016   Procedure: MRI OF BRAIN WITH AND WITHOUT;  Surgeon: Medication Radiologist, MD;  Location: [facility];  Service: Radiology;  Laterality: N/A;  . RADIOLOGY WITH ANESTHESIA N/A 03/02/2017   Procedure: MRI of BRAIN W and W/OUT CONTRAST;  Surgeon: Medication Radiologist, MD;  Location: [facility];  Service: Radiology;  Laterality: N/A;  . RADIOLOGY WITH ANESTHESIA N/A 07/29/2017   Procedure: RADIOLOGY WITH ANESTHESIA MRI OF BRAIN WITH AND WITHOUT CONTRAST;  Surgeon: Radiologist, Medication, MD;  Location: [facility];  Service: Radiology;  Laterality:  N/A;  . RADIOLOGY WITH ANESTHESIA N/A 12/07/2017   Procedure: MRI WITH ANESTHESIA OF BRAIN WITH AND WITHOUT CONTRAST;  Surgeon: Radiologist, Medication, MD;  Location: [facility];  Service: Radiology;  Laterality: N/A;  . RADIOLOGY WITH ANESTHESIA N/A 04/07/2018   Procedure: MRI OF BRAIN WITH AND WITHOUT CONTRAST;  Surgeon: Radiologist, Medication, MD;  Location: [facility];  Service: Radiology;  Laterality: N/A;  . RADIOLOGY WITH ANESTHESIA N/A 08/23/2018   Procedure: MRI WITH ANESTHESIA OF THE BRAIN WITH AND WITHOUT;  Surgeon: Radiologist, Medication, MD;  Location: [facility];  Service: Radiology;  Laterality: N/A;  . RADIOLOGY WITH ANESTHESIA N/A 01/24/2019   Procedure: MRI OF BRAIN WITH AND WITHOUT CONTRAST;  Surgeon: Radiologist, Medication, MD;  Location: [facility];  Service: Radiology;  Laterality: N/A;  . RADIOLOGY WITH ANESTHESIA N/A 07/06/2019   Procedure: MRI WITH ANESTHESIA OF BRAIN WITH AND WITHOUT CONTRAST;  Surgeon: Radiologist, Medication, MD;  Location: [facility];  Service: Radiology;  Laterality: N/A;  . RADIOLOGY WITH ANESTHESIA N/A 01/11/2020   Procedure: MRI WITH ANESTHESIA BRAIN WITH AND WITHOUT;  Surgeon: Radiologist, Medication, MD;  Location: [facility];  Service: Radiology;  Laterality: N/A;  . RADIOLOGY WITH ANESTHESIA N/A 05/21/2020   Procedure: MRI WITH ANESTHESIA OF BRAIN WITH AND WITHOUT CONTRAST;  Surgeon: Radiologist, Medication, MD;  Location: [facility];  Service: Radiology;  Laterality: N/A;  . RADIOLOGY WITH ANESTHESIA N/A 01/02/2021   Procedure: MRI WITH ANESTHESIA  BRAIN WITH AND WITHOUT CONTRAST,TOTAL SPINE MET SCREENING;  Surgeon: Radiologist, Medication, MD;  Location: [facility];  Service: Radiology;  Laterality: N/A;  . right power port placement Right     FAMILY HISTORY Family History  Problem Relation Age of Onset  . Diabetes Mother   . Bipolar disorder Mother   . CAD Father    The patient's father still living, age 52, in [place]. He had prostate cancer at some point in the past. The  patient's mother died at age 53 from complications of diabetes. The patient had no brothers, 2 sisters. A paternal grandmother had lung cancer in the setting of tobacco abuse. There is no other history of cancer in the family to her knowledge   GYNECOLOGIC HISTORY:  No LMP recorded. Patient has had an ablation. Menarche approximately age 15. First live birth in 16. The patient is GX P2. She underwent endometrial ablation in 2016.   SOCIAL HISTORY: Updated January 2022 The patient is originally from [place]. She has lived in [place] before but more recently was in [place]. She is back here because she could not afford her rent in [place].  She now has has around 1 bedroom apartment on [address].  She is divorced. Her 2 children are [name] who lives in [place] and works as a Development worker, community, and [name] who also lives in [place] and works as a Catering manager. The patient has a grandchild, [name], 8 years  old as of July 2019, living in [place] with his mother. The patient also has a grandson born in February 2019, who lives in [place]. The patient has not established herself with a local church yet.    ADVANCED DIRECTIVES: Not in place; at the 06/03/2016 visit the patient was given the appropriate forms to complete and notarize at her discretion   HEALTH MAINTENANCE: Social History   Tobacco Use  . Smoking status: Former Smoker    Packs/day: 1.00    Years: 43.00    Pack years: 43.00    Types: Cigarettes  . Smokeless tobacco: Never Used  . Tobacco comment: 3 day since last cigarette  Vaping Use  . Vaping Use: Some days  Substance Use Topics  . Alcohol use: No    Comment: no ETOH since 08/22/12  . Drug use: No    Comment: states she's in recovery program for 7 years     Colonoscopy:  PAP:  Bone density:   Allergies  Allergen Reactions  . Demerol [Meperidine Hcl] Itching and Nausea And Vomiting    INTOLERANCE >  N & V  . Erythromycin Rash    Current  Outpatient Medications on File Prior to Visit  Medication Sig Dispense Refill  . acetaminophen (TYLENOL) 500 MG tablet Take 2 tablets (1,000 mg total) by mouth every 8 (eight) hours as needed for moderate pain. 100 tablet 6  . anastrozole (ARIMIDEX) 1 MG tablet Take 1 tablet (1 mg total) by mouth daily. 90 tablet 1  . azelastine (ASTELIN) 0.1 % nasal spray Place 2 sprays into both nostrils 2 (two) times daily. Use in each nostril as directed (Patient taking differently: Place 2 sprays into both nostrils daily. Use in each nostril as directed) 30 mL 12  . carvedilol (COREG) 3.125 MG tablet TAKE 1 TABLET (3.125 MG TOTAL) BY MOUTH 2 TIMES DAILY FOR 10 DAYS, THEN 2 TABLETS 2 TIMES DAILY. 120 tablet 3  . cholecalciferol (VITAMIN D3) 25 MCG (1000 UNIT) tablet Take 2,000 Units by mouth daily.    . cyclobenzaprine (FLEXERIL) 10 MG tablet Take 1 tablet (10 mg total) by mouth 2 (two) times daily. 30 tablet 2  . docusate sodium (COLACE) 100 MG capsule Take 1 capsule (100 mg total) by mouth daily. 100 capsule 6  . fluticasone (FLONASE) 50 MCG/ACT nasal spray SPRAY 2 SPRAYS INTO EACH NOSTRIL EVERY DAY 16 mL 0  . ibuprofen (ADVIL) 800 MG tablet Take 1 tablet (800 mg total) by mouth 2 (two) times daily as needed for moderate pain. Take with food. (Patient taking differently: Take 800 mg by mouth in the morning and at bedtime. Take with food.) 60 tablet 6  . ketorolac (ACULAR) 0.5 % ophthalmic solution Place 1 drop into the left eye 4 (four) times daily.    [name] lamoTRIgine (LAMICTAL) 100 MG tablet Take 100 mg by mouth every morning.    . loratadine (CLARITIN) 10 MG tablet Take 1 tablet (10 mg total) by mouth daily. 90 tablet 3  . Lurasidone HCl 60 MG TABS Take 60 mg by mouth daily.    . ondansetron (ZOFRAN) 8 MG tablet TAKE 1 TABLET (8 MG TOTAL) BY MOUTH EVERY 8 (EIGHT) HOURS AS NEEDED FOR NAUSEA OR VOMITING. 90 tablet 1  . pantoprazole (PROTONIX) 40 MG tablet Take 1 tablet (40 mg total) by mouth daily. 90 tablet 3   . polyethylene glycol (MIRALAX / GLYCOLAX) 17 g packet Take 17 g by mouth daily as needed for mild constipation. (Patient taking  differently: Take 17 g by mouth every 3 (three) days.) 30 each 6  . PREDNISOLONE ACETATE P-F 1 % ophthalmic suspension Place 1 drop into the left eye 4 (four) times daily.    . traZODone (DESYREL) 100 MG tablet Take 1 tablet (100 mg total) by mouth at bedtime. (Patient taking differently: Take 300 mg by mouth at bedtime.) 30 tablet 0  . VIGAMOX 0.5 % ophthalmic solution Place 1 drop into the left eye in the morning, at noon, in the evening, and at bedtime.    . rosuvastatin (CRESTOR) 10 MG tablet Take 1 tablet (10 mg total) by mouth daily. 90 tablet 3   No current facility-administered medications on file prior to visit.    OBJECTIVE:  Vitals:   01/23/21 0836  BP: 118/67  Pulse: 86  Resp: 17  Temp: (!) 97.5 F (36.4 C)  TempSrc: Tympanic  SpO2: 97%  Weight: 162 lb 6.4 oz (73.7 kg)  Height: _0  (1.651 m)  Body mass index is 27.02 kg/m.  ECOG FS: 2  GENERAL: Patient is a well appearing female in no acute distress HEENT:  Sclerae anicteric.  No oropharyngeal concerns, no ulcerations or candida. Neck is supple.  NODES:  No cervical, supraclavicular, or axillary lymphadenopathy palpated.  BREAST EXAM:  Right breast benign, left breast s/p mastectomy and reconstruction, no sign of local recurrence. LUNGS:  Clear to auscultation bilaterally.  No wheezes or rhonchi. HEART:  Regular rate and rhythm. No murmur appreciated. ABDOMEN:  Soft, nontender.  Positive, normoactive bowel sounds. No organomegaly palpated. MSK:  No focal spinal tenderness to palpation. Full range of motion bilaterally in the upper extremities. EXTREMITIES:  No peripheral edema.   SKIN:  Clear with no obvious rashes or skin changes. No nail dyscrasia. NEURO:  Nonfocal. Well oriented.  Appropriate affect.    LAB RESULTS: No results found for: LABCA2  CBC    Component Value Date/Time    WBC 2.8 (L) 01/23/2021 0825   RBC 3.45 (L) 01/23/2021 0825   HGB 10.5 (L) 01/23/2021 0825   HGB 13.5 07/05/2019 1129   HGB 12.1 10/26/2017 1038   HCT 32.7 (L) 01/23/2021 0825   HCT 35.7 10/26/2017 1038   PLT 92 (L) 01/23/2021 0825   PLT 54 (L) 07/05/2019 1129   PLT 167 10/26/2017 1038   MCV 94.8 01/23/2021 0825   MCV 98.4 10/26/2017 1038   MCH 30.4 01/23/2021 0825   MCHC 32.1 01/23/2021 0825   RDW 11.9 01/23/2021 0825   RDW 13.3 10/26/2017 1038   LYMPHSABS 0.8 01/23/2021 0825   LYMPHSABS 0.5 (L) 10/26/2017 1038   MONOABS 0.3 01/23/2021 0825   MONOABS 0.1 10/26/2017 1038   EOSABS 0.0 01/23/2021 0825   EOSABS 0.0 10/26/2017 1038   BASOSABS 0.0 01/23/2021 0825   BASOSABS 0.0 10/26/2017 1038   CMP Latest Ref Rng & Units 01/01/2021 12/09/2020 11/12/2020  Glucose 70 - 99 mg/dL 80 100(H) 97  BUN 6 - 20 mg/dL _1 Creatinine 0.44 - 1.00 mg/dL 1.00 1.04(H) 0.99  Sodium 135 - 145 mmol/L 143 140 140  Potassium 3.5 - 5.1 mmol/L 3.8 4.3 4.0  Chloride 98 - 111 mmol/L 108 104 106  CO2 22 - 32 mmol/L _2 Calcium 8.9 - 10.3 mg/dL 9.2 9.3 8.9  Total Protein 6.5 - 8.1 g/dL 6.2(L) 6.7 6.3(L)  Total Bilirubin 0.3 - 1.2 mg/dL 0.3 0.4 0.4  Alkaline Phos 38 - 126 U/L 56 60 58  AST 15 - 41 U/L  _0 ALT 0 - 44 U/L _1 STUDIES: CT Chest W Contrast  Result Date: 01/21/2021 CLINICAL DATA:  Metastatic breast cancer.  Restaging. EXAM: CT CHEST WITH CONTRAST TECHNIQUE: Multidetector CT imaging of the chest was performed during intravenous contrast administration. CONTRAST:  45m OMNIPAQUE IOHEXOL 300 MG/ML  SOLN COMPARISON:  06/10/2020 chest CT. FINDINGS: Cardiovascular: Normal heart size. No significant pericardial effusion/thickening. Right subclavian Port-A-Cath terminates in the lower third of the SVC. Great vessels are normal in course and caliber. No central pulmonary emboli. Mediastinum/Nodes: Stable coarsely calcified subcentimeter left thyroid nodules. Not clinically  significant; no follow-up imaging recommended (ref: J Am Coll Radiol. 2015 Feb;12(2): 143-50). Unremarkable esophagus. No pathologically enlarged axillary, mediastinal or hilar lymph nodes. Lungs/Pleura: No pneumothorax. No pleural effusion. Parenchymal bands in the bilateral lower lobes are unchanged. No acute consolidative airspace disease, lung masses or significant pulmonary nodules. Stable minimal patchy subpleural reticulation in the anterior left upper lobe compatible with minimal radiation fibrosis. Upper abdomen: A few scattered subcentimeter hypodense liver lesions are too small to characterize and are not appreciably changed. Musculoskeletal: Patchy mildly sclerotic lesions in T6, T9, T11 and L1 vertebral bodies, not appreciably changed. Stable small sclerotic foci in the anterior right fifth and sixth ribs, potentially posttraumatic. No new focal osseous lesions in the chest. Left breast prosthesis again noted. IMPRESSION: 1. Stable exam. Scattered mildly sclerotic osseous metastases in the thoracic spine are unchanged. 2. No evidence of new or progressive metastatic disease in the chest. 3. Stable scattered subcentimeter low-attenuation liver lesions, too small to characterize. Electronically Signed   By: JIlona SorrelM.D.   On: 01/21/2021 13:13   MR Brain W Wo Contrast  Result Date: 01/03/2021 CLINICAL DATA:  Metastatic breast cancer. Follow-up response to treatment. EXAM: MRI HEAD WITHOUT AND WITH CONTRAST TECHNIQUE: Multiplanar, multiecho pulse sequences of the brain and surrounding structures were obtained without and with intravenous contrast. CONTRAST:  778mGADAVIST GADOBUTROL 1 MMOL/ML IV SOLN COMPARISON:  MRI head 05/21/2020 FINDINGS: Brain: Generalized atrophy and extensive white matter T2 hyperintensity appears unchanged. Few small areas of chronic microhemorrhage in the brain. 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 No recurrent tumor. Dural thickening and  enhancement right parietal lobe with mild progression. This is site of prior subdural hematoma. Enhancement is smooth and without nodularity. No enhancing brain metastasis identified. Negative for acute infarct. Vascular: Normal arterial flow voids. Skull and upper cervical spine: No skeletal lesion identified. Sinuses/Orbits: Paranasal sinuses clear. Bilateral cataract extraction. Other: None IMPRESSION: No recurrent tumor at the site of left cerebellar tumor resection. No new  metastatic deposits in the brain Dural thickening enhancement right parietal lobe with mild progression. This is the site of prior subdural hematoma. Continued follow-up recommended. Electronically Signed   By: ChFranchot Gallo.D.   On: 01/03/2021 13:47   ECHOCARDIOGRAM COMPLETE  Result Date: 12/31/2020    ECHOCARDIOGRAM REPORT   Patient Name:   EMNEASIA FLEEMANate of Exam: 12/31/2020 Medical Rec #:  [id number]  Height:       65.0 in Accession #:    [id number] Weight:       156.4 lb Date of Birth:  5/10-16-1962  BSA:          1.782 m Patient [age]:    [age]     BP:           104/60 mmHg Patient Gender: F  HR:           70 bpm. Exam Location:  Outpatient Procedure: 2D Echo Indications:    Chemotherapy evaluation v87.41  History:        Patient has prior history of Echocardiogram examinations, most                 recent 08/20/2020. Breast Cancer; Risk Factors:Hypertension and                 Dyslipidemia.  Sonographer:    Mikki Santee RDCS (AE) Referring Phys: [id number] Elby Showers [facility]  Sonographer Comments: Technically difficult study due to poor echo windows. Image acquisition challenging due to breast implants. IMPRESSIONS  1. Left ventricular ejection fraction, by estimation, is 60 to 65%. The left ventricle has normal function. The left ventricle has no regional wall motion abnormalities. Left ventricular diastolic parameters were normal. GLS normal, -22.8%.  2. Right ventricular systolic function is normal. The right  ventricular size is normal. Tricuspid regurgitation signal is inadequate for assessing PA pressure.  3. The mitral valve is normal in structure. No evidence of mitral valve regurgitation. No evidence of mitral stenosis.  4. The aortic valve is tricuspid. Aortic valve regurgitation is not visualized. No aortic stenosis is present.  5. The inferior vena cava is normal in size with greater than 50% respiratory variability, suggesting right atrial pressure of 3 mmHg. FINDINGS  Left Ventricle: Left ventricular ejection fraction, by estimation, is 60 to 65%. The left ventricle has normal function. The left ventricle has no regional wall motion abnormalities. The left ventricular internal cavity size was normal in size. There is  no left ventricular hypertrophy. Left ventricular diastolic parameters were normal. Right Ventricle: The right ventricular size is normal. No increase in right ventricular wall thickness. Right ventricular systolic function is normal. Tricuspid regurgitation signal is inadequate for assessing PA pressure. Left Atrium: Left atrial size was normal in size. Right Atrium: Right atrial size was normal in size. Pericardium: There is no evidence of pericardial effusion. Mitral Valve: The mitral valve is normal in structure. No evidence of mitral valve regurgitation. No evidence of mitral valve stenosis. Tricuspid Valve: The tricuspid valve is normal in structure. Tricuspid valve regurgitation is not demonstrated. Aortic Valve: The aortic valve is tricuspid. Aortic valve regurgitation is not visualized. No aortic stenosis is present. Pulmonic Valve: The pulmonic valve was normal in structure. Pulmonic valve regurgitation is not visualized. Aorta: The aortic root is normal in size and structure. Venous: The inferior vena cava is normal in size with greater than 50% respiratory variability, suggesting right atrial pressure of 3 mmHg. IAS/Shunts: No atrial level shunt detected by color flow Doppler.  LEFT  VENTRICLE PLAX 2D LVIDd:         3.90 cm  Diastology LVIDs:         2.60 cm  LV e' medial:    8.38 cm/s LV PW:         1.10 cm  LV E/e' medial:  8.2 LV IVS:        1.10 cm  LV e' lateral:   11.60 cm/s LVOT diam:     2.00 cm  LV E/e' lateral: 5.9 LV SV:         62 LV SV Index:   35 LVOT Area:     3.14 cm  RIGHT VENTRICLE RV S prime:     9.03 cm/s TAPSE (M-mode): 1.8 cm LEFT ATRIUM  Index       RIGHT ATRIUM          Index LA diam:        3.30 cm 1.85 cm/m  RA Area:     9.59 cm LA Vol (A2C):   35.6 ml 19.98 ml/m RA Volume:   20.00 ml 11.22 ml/m LA Vol (A4C):   27.5 ml 15.43 ml/m LA Biplane Vol: 33.2 ml 18.63 ml/m  AORTIC VALVE LVOT Vmax:   86.20 cm/s LVOT Vmean:  55.500 cm/s LVOT VTI:    0.197 m  AORTA Ao Root diam: 2.70 cm MITRAL VALVE MV Area (PHT): 2.95 cm    SHUNTS MV Decel Time: 257 msec    Systemic VTI:  0.20 m MV E velocity: 68.90 cm/s  Systemic Diam: 2.00 cm MV A velocity: 59.60 cm/s MV E/A ratio:  1.16 Loralie Champagne MD Electronically signed by Loralie Champagne MD Signature Date/Time: 12/31/2020/12:28:54 PM    Final    MR TOTAL SPINE METS SCREENING  Result Date: 01/03/2021 CLINICAL DATA:  Breast cancer staging. EXAM: MRI TOTAL SPINE WITHOUT AND WITH CONTRAST TECHNIQUE: Multisequence MR imaging of the spine from the cervical spine to the sacrum was performed prior to and following IV contrast administration for evaluation of spinal metastatic disease. CONTRAST:  22m GADAVIST GADOBUTROL 1 MMOL/ML IV SOLN COMPARISON:  CT chest 06/10/2020. FINDINGS: MRI CERVICAL SPINE FINDINGS Alignment: Normal Vertebrae: Vertebral body heights are maintained. No evidence of marrow replacing lesion. Small inferior C6 degenerative Schmorl's node. No abnormal enhancement. Cord: Normal cord signal.  No evidence of abnormal enhancement. Posterior Fossa fossa and paraspinal tissues: Limited evaluation given only imaged on sagittals without overt abnormality. Disc levels: Small posterior disc osteophyte complexes at  C4-C5, C5-C6 and C6-C7 which partially efface ventral CSF without significant canal stenosis. Evaluation of the foramina is limited given absence of axial sequences on this protocol. MRI THORACIC SPINE FINDINGS Alignment:  Normal. Vertebrae: Nonenhancing, T1 hypointense and STIR hyperintense T9 lesion, compatible with known bone metastasis. No other enhancing lesions identified. Vertebral body heights are maintained. Cord:  Normal cord signal.  No evidence of abnormal enhancement. Paraspinal and other soft tissues: Limited evaluation given only imaged on sagittals without overt abnormality. Disc levels: Mild multilevel degenerative change without significant canal. No evidence of high-grade foraminal stenosis although of the foramina is limited given absence of axial sequences on this protocol. MRI LUMBAR SPINE FINDINGS Segmentation:  Standard. Alignment:  Normal. Vertebrae: Vertebral body heights are maintained. No focal marrow replacing lesions identified. No abnormal enhancement. Conus medullaris: Extends to the L1-L2 level and appears normal. No abnormal enhancement. Paraspinal and other soft tissues: Unremarkable. Disc levels: There are small broad disc bulges at L4-L5 and L5-S1 and mild lower lumbar facet hypertrophy. No significant canal stenosis. No evidence of high-grade foraminal stenosis although of the foramina is limited given limited screening protocol. IMPRESSION: 1. Nonenhancing T9 lesion, compatible with known bony metastasis. Additional sclerotic lesions seen on prior CT studies are not well visualized by MRI. No enhancing osseous metastatic disease. 2. No evidence of pathologic fracture or epidural involvement. 3. Mild multilevel degenerative change without significant canal stenosis, as detailed above. Electronically Signed   By: FMargaretha SheffieldMD   On: 01/03/2021 14:20     ELIGIBLE FOR AVAILABLE RESEARCH PROTOCOL: no  ASSESSMENT: 60y.o. 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  (2) status post left mastectomy and axillary lymph node dissection  with immediate expander placement 07/18/2015 for an mpT4 pN2,stage IIIB invasive ductal carcinoma, grade 3, with negative margins.  (a) definitive implant exchange to be scheduled in December   METASTATIC DISEASE: October 2016  (3) CT scan of the chest abdomen and pelvis  09/16/2015 shows metastatic lesions in the right scapula, left iliac crest, L4, and T spine. There were questionable liver cysts, with repeat CT scan 03/02/2016 showing possible right upper lobe lung lesions and possibly increased liver lesions  (a) CT scan of the chest 06/17/2016 shows no active disease in the lungs or liver  (b) Bone scan July 2017 showed no evidence of bony metastatic disease   (c) head CT 07/08/2016 showed a cerebellar lesion, confirmed by MRI 07/23/2016, status post craniotomy 08/14/2016, confirming a metastatic deposit which was estrogen and progesterone receptor negative, HER-2 amplified with a signals ratio of 7.16, number per cell 13.25  (d) CA 27-29 is not informative  (4) received docetaxel every 3 weeks 6 together with trastuzumab and pertuzumab, last docetaxel dose 02/11/2016  (5) adjuvant radiation7/03/2016 to 06/26/2016 at [place]: 1. The Left chest wall was treated to 23.4 Gy in 13 fractions at 1.8 Gy per fraction. 2. The Left chest wall was boosted to 10 Gy in 5 fractions at 2 Gy per fraction. 3. The Left Sclav/PAB was treated to 23.4 Gy in 13 fractions at 1.8 Gy per fraction.  [Note: Including the patient's treatment in [place] (received 15 fractions per Dr. Maryan Rued near [place], [place]), the patient received 50.4 Gy to the left chest wall and supraclavicular region. ]  (6) started trastuzumab and pertuzumab October 2016, continuing  every 3 weeks,  (a) echocardiogram 02/26/2016 showed a well preserved ejection fraction  (b) echocardiogram 07/01/2016 shows an ejection fraction in the 60-65%   (c) pertuzumab discontinued 10/2016 with uncontrolled diarrhea  (d) echocardiogram 11/11/2016 showed an ejection fraction in the 60-65%  (e) echocardiogram 03/03/2017 shows an ejection fraction of 60-65%  (f) echocardiogram on 05/19/2017 shows an ejection fraction of 55-60%  (g) echocardiogram 09/24/2017 shows the ejection fraction in the 60-65%  (h) echocardiogram 02/14/2018 shows an ejection fraction in the 60-65%  (I) echocardiogram  06/30/2018 shows an ejection fraction in the 55-60%  (m) echocardiogram on 12/08/2018 shows an ejection fraction in the 60-65% range  (7) started denosumab/Xgeva October 2017 given every 4 weeks, transitioned to every 8 weeks beginning 10/11/18 (every 6 weeks while giving TDM1 every 3 weeks)  (a) Xgeva held after 08/21/2020 dose due to denatal concerns  (8) started anastrozole October 2017   (a) bone scan 11/10/2016 shows no active disease  (b) chest CT scan 11/10/2016 stable, with no evidence of active disease  (c) chest CT and bone scan 07/02/2017 show no evidence of active disease  (d) CT scan of the chest with contrast 11/10/2017 shows some left axillary edema, but no evidence of thrombosis or adenopathy in that area, 0.9 cm precarinal lymph and 0.7 cm right upper lobe nodule node; bone lesions stable  (e) CT of the chest 05/04/2018 shows a 1.4 cm right lower paratracheal node which is slightly increased and a new right prevascular mediastinal node measuring 0.7 cm.  Bone lesions are stable.  (f) chest CT on 07/01/2018 shows no definite findings of metastatic disease in the thorax. Previously noted borderline enlarged low right paratracheal lymph node is stable to slightly decreased in size   (e) chest CT  on 12/30/2018 notes mild increase in right paratracheal adneopathy, recommended PET scan.  Pet scan  on 01/19/2019 showed a hypermetabolic and enlarged right paratracheal lymph node consistent with breast cancer recurrence.  (f)Trastuzumab discontinued due to February 2020 scans   (g) TDM-1 started on 01/31/2019 given every 21 days.  (h) Chest CT 05/15/2019 shows decrease in mediastinal adenopathy  (i) CT chest on 08/17/2019 shows resolution of thoracic adenopathy  (j) TDM 1 discontinued after 10/11/2019 dose (8 months treatment) because of thrombocytopenia  (k) trastuzumab resumed 11/01/2019, repeated every 21 days  (l) echocardiogram 12/06/2019 showed an ejection fraction in the 60-65% range  (m) chest CT scan, brain CT and bone scan 01/31/2020 show no evidence of active disease  (n) CT chest with contrast 06/10/2020 showed no evidence of actice/progressive disease  (9) history of bipolar disorder  (a) currently on Lamictal and Latuda as well as Desyrel and Astelin   (10) mild anemia with a significant drop in the MCV, ferritin 10 on 06/03/2016,   (a) Feraheme given 06/12/2016 and 06/18/2016  (11) tobacco abuse: Patient quit August 2021, vaping instead  (12) 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  (a) brain irradiation10/19/17-11/08/17: Whole brain/ 35 Gy in 14 fractions   (b) repeat brain MRI obtained 12/10/2016 shows no active disease in the brain  (c) repeat brain MRI 03/02/2017 shows no evidence of residual or recurrent disease  (d) repeat brain MRI 07/29/2017 shows no evidence of residual or recurrent disease  (e) repeat brain MRI 12/07/2017 shows no evidence of disease recurrence.  There is progressive white matter change secondary to prior treatment.  (f) repeat brain MRI 04/07/2018 showed no evidence of disease\  (g) repeat brain MRI on 08/23/2018 shows no evidence of disease  (h) repeat brain MRI on 01/25/2019 shows no evidence of disease  (I) brain MRI 07/06/2019 shows no evidence of active disease  (j) brain MRI 01/11/2020 shows a  subdural hematoma measuring 0.8 cm but no evidence of recurrent metastatic disease  (k) brain MRI 05/21/2020 no evidence of recurrent disease; chronic changes c/w prior treatment and recent subdural (otherwise resolved)   PLAN: Lachlyn is doing well today and has no clinical or radiographic sign of breast cancer progression which is great news.  She will proceed with her Trastuzumab today.  We will continue to treat her every three weeks, and will see her with every other treatment.    She has f/u with Dr. Mickeal Skinner at the end of the year, as her brain metastases are controlled, and he is planning brain MRI around that time.  Jaxson's dental extraction has healed and she can resume [place] today.  I have updated her orders.  We discussed her taking some extra calcium in form of tums.  We sent  Wet prep and I ordered a urinalysis and culture if she is able to leave a sample over in infusion.    Irlanda is in agreement with the plan.    Total encounter time 30 minutes.Wilber Bihari, NP 01/23/21 9:07 AM Medical Oncology and Hematology [facility] [place], [place] [address] Tel. [phone]    Fax. [phone]  Addendum: Wet prep shows bacterial vaginosis.  Metrogel 0.75% sent to [facility] outpatient pharmacy for her to insert vaginally BID.  She has used this before without difficulty.  I called and gave this message with her nurse Heidi in infusion.   *Total Encounter Time as defined by the Centers for Medicare  and Medicaid Services includes, in addition to the face-to-face time of a patient visit (documented in the note above) non-face-to-face time: obtaining and reviewing outside history, ordering and reviewing medications, tests or procedures, care coordination (communications with other health care professionals or caregivers) and documentation in the medical record.

## 2021-01-23 ENCOUNTER — Other Ambulatory Visit: Payer: Self-pay | Admitting: Adult Health

## 2021-01-23 ENCOUNTER — Inpatient Hospital Stay: Payer: Medicaid Other

## 2021-01-23 ENCOUNTER — Inpatient Hospital Stay (HOSPITAL_BASED_OUTPATIENT_CLINIC_OR_DEPARTMENT_OTHER): Payer: Medicaid Other | Admitting: Adult Health

## 2021-01-23 ENCOUNTER — Encounter: Payer: Self-pay | Admitting: Adult Health

## 2021-01-23 ENCOUNTER — Other Ambulatory Visit: Payer: Self-pay

## 2021-01-23 VITALS — BP 118/67 | HR 86 | Temp 97.5°F | Resp 17 | Ht 65.0 in | Wt 162.4 lb

## 2021-01-23 DIAGNOSIS — C770 Secondary and unspecified malignant neoplasm of lymph nodes of head, face and neck: Secondary | ICD-10-CM | POA: Diagnosis not present

## 2021-01-23 DIAGNOSIS — R300 Dysuria: Secondary | ICD-10-CM

## 2021-01-23 DIAGNOSIS — N898 Other specified noninflammatory disorders of vagina: Secondary | ICD-10-CM

## 2021-01-23 DIAGNOSIS — C50312 Malignant neoplasm of lower-inner quadrant of left female breast: Secondary | ICD-10-CM

## 2021-01-23 DIAGNOSIS — Z5112 Encounter for antineoplastic immunotherapy: Secondary | ICD-10-CM | POA: Diagnosis not present

## 2021-01-23 DIAGNOSIS — D696 Thrombocytopenia, unspecified: Secondary | ICD-10-CM

## 2021-01-23 DIAGNOSIS — C50119 Malignant neoplasm of central portion of unspecified female breast: Secondary | ICD-10-CM

## 2021-01-23 DIAGNOSIS — C7951 Secondary malignant neoplasm of bone: Secondary | ICD-10-CM

## 2021-01-23 DIAGNOSIS — Z95828 Presence of other vascular implants and grafts: Secondary | ICD-10-CM

## 2021-01-23 DIAGNOSIS — F411 Generalized anxiety disorder: Secondary | ICD-10-CM

## 2021-01-23 DIAGNOSIS — C7931 Secondary malignant neoplasm of brain: Secondary | ICD-10-CM

## 2021-01-23 DIAGNOSIS — N760 Acute vaginitis: Secondary | ICD-10-CM

## 2021-01-23 DIAGNOSIS — B9689 Other specified bacterial agents as the cause of diseases classified elsewhere: Secondary | ICD-10-CM

## 2021-01-23 LAB — COMPREHENSIVE METABOLIC PANEL
ALT: 17 U/L (ref 0–44)
AST: 22 U/L (ref 15–41)
Albumin: 3.7 g/dL (ref 3.5–5.0)
Alkaline Phosphatase: 66 U/L (ref 38–126)
Anion gap: 9 (ref 5–15)
BUN: 11 mg/dL (ref 6–20)
CO2: 27 mmol/L (ref 22–32)
Calcium: 9.1 mg/dL (ref 8.9–10.3)
Chloride: 106 mmol/L (ref 98–111)
Creatinine, Ser: 1.06 mg/dL — ABNORMAL HIGH (ref 0.44–1.00)
GFR, Estimated: >60 mL/min (ref >60)
Glucose, Bld: 102 mg/dL — ABNORMAL HIGH (ref 70–99)
Potassium: 3.9 mmol/L (ref 3.5–5.1)
Sodium: 142 mmol/L (ref 135–145)
Total Bilirubin: 0.3 mg/dL (ref 0.3–1.2)
Total Protein: 6.5 g/dL (ref 6.5–8.1)

## 2021-01-23 LAB — CBC WITH DIFFERENTIAL/PLATELET
Abs Immature Granulocytes: 0.02 10*3/uL (ref 0.00–0.07)
Basophils Absolute: 0 10*3/uL (ref 0.0–0.1)
Basophils Relative: 1 %
Eosinophils Absolute: 0 10*3/uL (ref 0.0–0.5)
Eosinophils Relative: 0 %
HCT: 32.7 % — ABNORMAL LOW (ref 36.0–46.0)
Hemoglobin: 10.5 g/dL — ABNORMAL LOW (ref 12.0–15.0)
Immature Granulocytes: 1 %
Lymphocytes Relative: 28 %
Lymphs Abs: 0.8 10*3/uL (ref 0.7–4.0)
MCH: 30.4 pg (ref 26.0–34.0)
MCHC: 32.1 g/dL (ref 30.0–36.0)
MCV: 94.8 fL (ref 80.0–100.0)
Monocytes Absolute: 0.3 10*3/uL (ref 0.1–1.0)
Monocytes Relative: 9 %
Neutro Abs: 1.7 10*3/uL (ref 1.7–7.7)
Neutrophils Relative %: 61 %
Platelets: 92 10*3/uL — ABNORMAL LOW (ref 150–400)
RBC: 3.45 MIL/uL — ABNORMAL LOW (ref 3.87–5.11)
RDW: 11.9 % (ref 11.5–15.5)
WBC: 2.8 10*3/uL — ABNORMAL LOW (ref 4.0–10.5)
nRBC: 0 % (ref 0.0–0.2)

## 2021-01-23 LAB — WET PREP, GENITAL
Sperm: NONE SEEN
Trich, Wet Prep: NONE SEEN
Yeast Wet Prep HPF POC: NONE SEEN

## 2021-01-23 MED ORDER — SODIUM CHLORIDE 0.9% FLUSH
10.0000 mL | INTRAVENOUS | Status: DC | PRN
  Administered 2021-01-23: 10 mL
  Filled 2021-01-23: qty 10

## 2021-01-23 MED ORDER — DENOSUMAB 120 MG/1.7ML ~~LOC~~ SOLN
120.0000 mg | Freq: Once | SUBCUTANEOUS | Status: AC
  Administered 2021-01-23: 120 mg via SUBCUTANEOUS

## 2021-01-23 MED ORDER — SODIUM CHLORIDE 0.9 % IV SOLN
510.0000 mg | Freq: Once | INTRAVENOUS | Status: DC
  Filled 2021-01-23: qty 17

## 2021-01-23 MED ORDER — METRONIDAZOLE 0.75 % VA GEL: 1.0000 | Freq: Two times a day (BID) | VAGINAL | 0 refills | Status: DC

## 2021-01-23 MED ORDER — ACETAMINOPHEN 325 MG PO TABS
650.0000 mg | ORAL_TABLET | Freq: Once | ORAL | Status: AC
  Administered 2021-01-23: 650 mg via ORAL

## 2021-01-23 MED ORDER — ACETAMINOPHEN 325 MG PO TABS
ORAL_TABLET | ORAL | Status: AC
  Filled 2021-01-23: qty 2

## 2021-01-23 MED ORDER — HEPARIN SOD (PORK) LOCK FLUSH 100 UNIT/ML IV SOLN
500.0000 [IU] | Freq: Once | INTRAVENOUS | Status: AC | PRN
  Administered 2021-01-23: 500 [IU]
  Filled 2021-01-23: qty 5

## 2021-01-23 MED ORDER — SODIUM CHLORIDE 0.9 % IV SOLN
Freq: Once | INTRAVENOUS | Status: AC
  Filled 2021-01-23: qty 250

## 2021-01-23 MED ORDER — TRASTUZUMAB-DKST CHEMO 150 MG IV SOLR
6.0000 mg/kg | Freq: Once | INTRAVENOUS | Status: AC
  Administered 2021-01-23: 399 mg via INTRAVENOUS
  Filled 2021-01-23: qty 19

## 2021-01-23 MED ORDER — DENOSUMAB 120 MG/1.7ML ~~LOC~~ SOLN
SUBCUTANEOUS | Status: AC
  Filled 2021-01-23: qty 1.7

## 2021-01-23 MED FILL — metroNIDAZOLE 0.75 % GEL: 0.75 | 7 days supply | Qty: 70 | Fill #0

## 2021-01-23 NOTE — Addendum Note (Signed)
Addended by: Scot Dock on: 01/23/2021 10:38 AM   Modules accepted: Orders

## 2021-01-23 NOTE — Patient Instructions (Signed)

## 2021-01-23 NOTE — Patient Instructions (Signed)
[facility] Discharge Instructions for Patients Receiving Chemotherapy  Today you received the following chemotherapy agents: trastuzumab-dkst Madalyn Rob).  To help prevent nausea and vomiting after your treatment, we encourage you to take your nausea medication as directed.   If you develop nausea and vomiting that is not controlled by your nausea medication, call the clinic.   BELOW ARE SYMPTOMS THAT SHOULD BE REPORTED IMMEDIATELY:  *FEVER GREATER THAN 100.5 F  *CHILLS WITH OR WITHOUT FEVER  NAUSEA AND VOMITING THAT IS NOT CONTROLLED WITH YOUR NAUSEA MEDICATION  *UNUSUAL SHORTNESS OF BREATH  *UNUSUAL BRUISING OR BLEEDING  TENDERNESS IN MOUTH AND THROAT WITH OR WITHOUT PRESENCE OF ULCERS  *URINARY PROBLEMS  *BOWEL PROBLEMS  UNUSUAL RASH Items with * indicate a potential emergency and should be followed up as soon as possible.  Feel free to call the clinic should you have any questions or concerns. The clinic phone number is [phone].  Please show the [place] at check-in to the Emergency Department and triage nurse.

## 2021-01-24 ENCOUNTER — Telehealth: Payer: Self-pay | Admitting: Adult Health

## 2021-01-24 NOTE — Telephone Encounter (Signed)
Scheduled appts per 2/24 los. Pt confirmed appt dates and times.

## 2021-02-13 ENCOUNTER — Other Ambulatory Visit: Payer: Self-pay

## 2021-02-13 ENCOUNTER — Inpatient Hospital Stay: Payer: Medicaid Other

## 2021-02-13 ENCOUNTER — Inpatient Hospital Stay: Payer: Medicaid Other | Attending: Medical

## 2021-02-13 VITALS — BP 122/80 | HR 90 | Temp 97.9°F | Resp 18 | Wt 165.8 lb

## 2021-02-13 DIAGNOSIS — C50312 Malignant neoplasm of lower-inner quadrant of left female breast: Secondary | ICD-10-CM

## 2021-02-13 DIAGNOSIS — Z95828 Presence of other vascular implants and grafts: Secondary | ICD-10-CM

## 2021-02-13 DIAGNOSIS — R300 Dysuria: Secondary | ICD-10-CM

## 2021-02-13 DIAGNOSIS — C770 Secondary and unspecified malignant neoplasm of lymph nodes of head, face and neck: Secondary | ICD-10-CM

## 2021-02-13 DIAGNOSIS — F411 Generalized anxiety disorder: Secondary | ICD-10-CM

## 2021-02-13 DIAGNOSIS — Z79899 Other long term (current) drug therapy: Secondary | ICD-10-CM | POA: Diagnosis not present

## 2021-02-13 DIAGNOSIS — Z5112 Encounter for antineoplastic immunotherapy: Secondary | ICD-10-CM | POA: Diagnosis not present

## 2021-02-13 DIAGNOSIS — C50119 Malignant neoplasm of central portion of unspecified female breast: Secondary | ICD-10-CM

## 2021-02-13 DIAGNOSIS — D696 Thrombocytopenia, unspecified: Secondary | ICD-10-CM

## 2021-02-13 DIAGNOSIS — C7951 Secondary malignant neoplasm of bone: Secondary | ICD-10-CM | POA: Insufficient documentation

## 2021-02-13 LAB — COMPREHENSIVE METABOLIC PANEL
ALT: 16 U/L (ref 0–44)
AST: 21 U/L (ref 15–41)
Albumin: 3.9 g/dL (ref 3.5–5.0)
Alkaline Phosphatase: 62 U/L (ref 38–126)
Anion gap: 7 (ref 5–15)
BUN: 10 mg/dL (ref 6–20)
CO2: 27 mmol/L (ref 22–32)
Calcium: 8.7 mg/dL — ABNORMAL LOW (ref 8.9–10.3)
Chloride: 107 mmol/L (ref 98–111)
Creatinine, Ser: 0.97 mg/dL (ref 0.44–1.00)
GFR, Estimated: >60 mL/min (ref >60)
Glucose, Bld: 112 mg/dL — ABNORMAL HIGH (ref 70–99)
Potassium: 3.8 mmol/L (ref 3.5–5.1)
Sodium: 141 mmol/L (ref 135–145)
Total Bilirubin: <0.2 mg/dL — ABNORMAL LOW (ref 0.3–1.2)
Total Protein: 6.6 g/dL (ref 6.5–8.1)

## 2021-02-13 LAB — CBC WITH DIFFERENTIAL/PLATELET
Abs Immature Granulocytes: 0.03 10*3/uL (ref 0.00–0.07)
Basophils Absolute: 0 10*3/uL (ref 0.0–0.1)
Basophils Relative: 1 %
Eosinophils Absolute: 0 10*3/uL (ref 0.0–0.5)
Eosinophils Relative: 0 %
HCT: 32.3 % — ABNORMAL LOW (ref 36.0–46.0)
Hemoglobin: 10.5 g/dL — ABNORMAL LOW (ref 12.0–15.0)
Immature Granulocytes: 1 %
Lymphocytes Relative: 23 %
Lymphs Abs: 0.8 10*3/uL (ref 0.7–4.0)
MCH: 30.4 pg (ref 26.0–34.0)
MCHC: 32.5 g/dL (ref 30.0–36.0)
MCV: 93.6 fL (ref 80.0–100.0)
Monocytes Absolute: 0.3 10*3/uL (ref 0.1–1.0)
Monocytes Relative: 9 %
Neutro Abs: 2.4 10*3/uL (ref 1.7–7.7)
Neutrophils Relative %: 66 %
Platelets: 94 10*3/uL — ABNORMAL LOW (ref 150–400)
RBC: 3.45 MIL/uL — ABNORMAL LOW (ref 3.87–5.11)
RDW: 12.1 % (ref 11.5–15.5)
WBC: 3.6 10*3/uL — ABNORMAL LOW (ref 4.0–10.5)
nRBC: 0 % (ref 0.0–0.2)

## 2021-02-13 MED ORDER — ACETAMINOPHEN 325 MG PO TABS
650.0000 mg | ORAL_TABLET | Freq: Once | ORAL | Status: AC
  Administered 2021-02-13: 650 mg via ORAL

## 2021-02-13 MED ORDER — PROCHLORPERAZINE MALEATE 10 MG PO TABS
ORAL_TABLET | ORAL | Status: AC
  Filled 2021-02-13: qty 1

## 2021-02-13 MED ORDER — HEPARIN SOD (PORK) LOCK FLUSH 100 UNIT/ML IV SOLN
500.0000 [IU] | Freq: Once | INTRAVENOUS | Status: AC | PRN
  Administered 2021-02-13: 500 [IU]
  Filled 2021-02-13: qty 5

## 2021-02-13 MED ORDER — TRASTUZUMAB-DKST CHEMO 150 MG IV SOLR: 6.0000 mg/kg | Freq: Once | INTRAVENOUS | Status: DC

## 2021-02-13 MED ORDER — SODIUM CHLORIDE 0.9% FLUSH
10.0000 mL | Freq: Once | INTRAVENOUS | Status: AC
  Administered 2021-02-13: 10 mL
  Filled 2021-02-13: qty 10

## 2021-02-13 MED ORDER — SODIUM CHLORIDE 0.9 % IV SOLN
Freq: Once | INTRAVENOUS | Status: AC
  Filled 2021-02-13: qty 250

## 2021-02-13 MED ORDER — ACETAMINOPHEN 325 MG PO TABS
ORAL_TABLET | ORAL | Status: AC
  Filled 2021-02-13: qty 4

## 2021-02-13 MED ORDER — TRASTUZUMAB-DKST CHEMO 150 MG IV SOLR
450.0000 mg | Freq: Once | INTRAVENOUS | Status: AC
  Administered 2021-02-13: 450 mg via INTRAVENOUS
  Filled 2021-02-13: qty 21.43

## 2021-02-13 MED ORDER — SODIUM CHLORIDE 0.9% FLUSH
10.0000 mL | INTRAVENOUS | Status: DC | PRN
  Administered 2021-02-13: 10 mL
  Filled 2021-02-13: qty 10

## 2021-02-13 MED ORDER — DIPHENHYDRAMINE HCL 25 MG PO CAPS
ORAL_CAPSULE | ORAL | Status: AC
  Filled 2021-02-13: qty 2

## 2021-02-13 NOTE — Patient Instructions (Addendum)
[facility] Discharge Instructions for Patients Receiving Chemotherapy  Today you received the following chemotherapy agents Traztuzumab-dkst([place])  To help prevent nausea and vomiting after your treatment, we encourage you to take your nausea medication as directed.   If you develop nausea and vomiting that is not controlled by your nausea medication, call the clinic.   BELOW ARE SYMPTOMS THAT SHOULD BE REPORTED IMMEDIATELY:  *FEVER GREATER THAN 100.5 F  *CHILLS WITH OR WITHOUT FEVER  NAUSEA AND VOMITING THAT IS NOT CONTROLLED WITH YOUR NAUSEA MEDICATION  *UNUSUAL SHORTNESS OF BREATH  *UNUSUAL BRUISING OR BLEEDING  TENDERNESS IN MOUTH AND THROAT WITH OR WITHOUT PRESENCE OF ULCERS  *URINARY PROBLEMS  *BOWEL PROBLEMS  UNUSUAL RASH Items with * indicate a potential emergency and should be followed up as soon as possible.  Feel free to call the clinic should you have any questions or concerns. The clinic phone number is [phone].  Please show the [facility] at check-in to the Emergency Department and triage nurse.

## 2021-02-13 NOTE — Progress Notes (Signed)
Ok to update dose to patients current weight per MD

## 2021-02-23 ENCOUNTER — Other Ambulatory Visit: Payer: Self-pay | Admitting: Family Medicine

## 2021-02-23 DIAGNOSIS — J302 Other seasonal allergic rhinitis: Secondary | ICD-10-CM

## 2021-02-23 NOTE — Telephone Encounter (Signed)
Flonase approved per protocol. Future visit 02/27/21. Flexeril will be routed to clinic for approval.

## 2021-02-23 NOTE — Telephone Encounter (Signed)
Requested medication (s) are due for refill today Undetermined  Last ordered 01/05/21 #30 tabs with 2 refills.  Requested medication (s) are on the active medication list Yes  Future visit scheduled Yes on 02/27/21.  Note to clinic-medication not delegated for PEC to refill.   Requested Prescriptions  Pending Prescriptions Disp Refills   cyclobenzaprine (FLEXERIL) 10 MG tablet [Pharmacy Med Name: CYCLOBENZAPRINE 10 MG TABLET] 30 tablet 2    Sig: TAKE 1 TABLET BY MOUTH TWICE A DAY      Not Delegated - Analgesics:  Muscle Relaxants Failed - 02/23/2021  7:31 AM      Failed - This refill cannot be delegated      Failed - Valid encounter within last 6 months    Recent Outpatient Visits           1 year ago Acute non-recurrent sinusitis of other sinus   [place], Enobong, MD   1 year ago Other constipation   [facility] Charlott Rakes, MD   1 year ago Metastatic breast cancer [facility])   [place], Enobong, MD   2 years ago Seasonal allergic rhinitis, unspecified trigger   [place] Elsie Stain, MD   2 years ago Bacterial vaginosis   [place], Enobong, MD       Future Appointments             In 4 days Charlott Rakes, MD [place]              Signed Prescriptions Disp Refills   fluticasone (FLONASE) 50 MCG/ACT nasal spray 16 mL 2    Sig: SPRAY 2 SPRAYS INTO EACH NOSTRIL EVERY DAY      Ear, Nose, and Throat: Nasal Preparations - Corticosteroids Failed - 02/23/2021  7:31 AM      Failed - Valid encounter within last 12 months    Recent Outpatient Visits           1 year ago Acute non-recurrent sinusitis of other sinus   [place], Enobong, MD   1 year ago Other constipation   [facility]  Charlott Rakes, MD   1 year ago Metastatic breast cancer [facility])   [place], Enobong, MD   2 years ago Seasonal allergic rhinitis, unspecified trigger   [place], Patrick E, MD   2 years ago Bacterial vaginosis   [place], Enobong, MD       Future Appointments             In 4 days Charlott Rakes, MD [place]

## 2021-02-27 ENCOUNTER — Other Ambulatory Visit (HOSPITAL_COMMUNITY)
Admission: RE | Admit: 2021-02-27 | Discharge: 2021-02-27 | Disposition: A | Discharge status: Home / Self Care | Payer: Medicaid Other | Source: Ambulatory Visit | Attending: Family Medicine | Admitting: Family Medicine

## 2021-02-27 ENCOUNTER — Ambulatory Visit: Payer: Medicaid Other | Attending: Family Medicine | Admitting: Family Medicine

## 2021-02-27 ENCOUNTER — Other Ambulatory Visit: Payer: Self-pay

## 2021-02-27 VITALS — BP 112/76 | HR 90 | Ht 65.0 in | Wt 162.6 lb

## 2021-02-27 DIAGNOSIS — Z885 Allergy status to narcotic agent status: Secondary | ICD-10-CM | POA: Insufficient documentation

## 2021-02-27 DIAGNOSIS — F319 Bipolar disorder, unspecified: Secondary | ICD-10-CM | POA: Diagnosis not present

## 2021-02-27 DIAGNOSIS — N9489 Other specified conditions associated with female genital organs and menstrual cycle: Secondary | ICD-10-CM

## 2021-02-27 DIAGNOSIS — Z833 Family history of diabetes mellitus: Secondary | ICD-10-CM | POA: Insufficient documentation

## 2021-02-27 DIAGNOSIS — N949 Unspecified condition associated with female genital organs and menstrual cycle: Secondary | ICD-10-CM

## 2021-02-27 DIAGNOSIS — Z7182 Exercise counseling: Secondary | ICD-10-CM | POA: Diagnosis not present

## 2021-02-27 DIAGNOSIS — Z124 Encounter for screening for malignant neoplasm of cervix: Secondary | ICD-10-CM | POA: Insufficient documentation

## 2021-02-27 DIAGNOSIS — Z853 Personal history of malignant neoplasm of breast: Secondary | ICD-10-CM | POA: Insufficient documentation

## 2021-02-27 DIAGNOSIS — Z1231 Encounter for screening mammogram for malignant neoplasm of breast: Secondary | ICD-10-CM

## 2021-02-27 DIAGNOSIS — Z87891 Personal history of nicotine dependence: Secondary | ICD-10-CM | POA: Diagnosis not present

## 2021-02-27 DIAGNOSIS — Z9012 Acquired absence of left breast and nipple: Secondary | ICD-10-CM | POA: Insufficient documentation

## 2021-02-27 DIAGNOSIS — N7689 Other specified inflammation of vagina and vulva: Secondary | ICD-10-CM | POA: Diagnosis not present

## 2021-02-27 DIAGNOSIS — Z79899 Other long term (current) drug therapy: Secondary | ICD-10-CM | POA: Diagnosis not present

## 2021-02-27 DIAGNOSIS — Z0000 Encounter for general adult medical examination without abnormal findings: Secondary | ICD-10-CM | POA: Diagnosis present

## 2021-02-27 DIAGNOSIS — Z923 Personal history of irradiation: Secondary | ICD-10-CM | POA: Insufficient documentation

## 2021-02-27 DIAGNOSIS — Z85841 Personal history of malignant neoplasm of brain: Secondary | ICD-10-CM | POA: Insufficient documentation

## 2021-02-27 MED ORDER — CYCLOBENZAPRINE HCL 10 MG PO TABS: 10.0000 mg | ORAL_TABLET | Freq: Two times a day (BID) | ORAL | 1 refills | Status: DC

## 2021-02-27 MED ORDER — CLOTRIMAZOLE 1 % EX CREA
1.0000 | TOPICAL_CREAM | Freq: Two times a day (BID) | CUTANEOUS | 0 refills | Status: DC
Start: 2021-02-27 — End: 2021-03-03

## 2021-02-27 NOTE — Patient Instructions (Signed)
Health Maintenance, Female Adopting a healthy lifestyle and getting preventive care are important in promoting health and wellness. Ask your health care provider about:  The right schedule for you to have regular tests and exams.  Things you can do on your own to prevent diseases and keep yourself healthy. What should I know about diet, weight, and exercise? Eat a healthy diet  Eat a diet that includes plenty of vegetables, fruits, low-fat dairy products, and lean protein.  Do not eat a lot of foods that are high in solid fats, added sugars, or sodium.   Maintain a healthy weight Body mass index (BMI) is used to identify weight problems. It estimates body fat based on height and weight. Your health care provider can help determine your BMI and help you achieve or maintain a healthy weight. Get regular exercise Get regular exercise. This is one of the most important things you can do for your health. Most adults should:  Exercise for at least 150 minutes each week. The exercise should increase your heart rate and make you sweat (moderate-intensity exercise).  Do strengthening exercises at least twice a week. This is in addition to the moderate-intensity exercise.  Spend less time sitting. Even light physical activity can be beneficial. Watch cholesterol and blood lipids Have your blood tested for lipids and cholesterol at 60 years of age, then have this test every 5 years. Have your cholesterol levels checked more often if:  Your lipid or cholesterol levels are high.  You are older than 60 years of age.  You are at high risk for heart disease. What should I know about cancer screening? Depending on your health history and family history, you may need to have cancer screening at various ages. This may include screening for:  Breast cancer.  Cervical cancer.  Colorectal cancer.  Skin cancer.  Lung cancer. What should I know about heart disease, diabetes, and high blood  pressure? Blood pressure and heart disease  High blood pressure causes heart disease and increases the risk of stroke. This is more likely to develop in people who have high blood pressure readings, are of African descent, or are overweight.  Have your blood pressure checked: ? Every 3-5 years if you are 18-39 years of age. ? Every year if you are 40 years old or older. Diabetes Have regular diabetes screenings. This checks your fasting blood sugar level. Have the screening done:  Once every three years after age 40 if you are at a normal weight and have a low risk for diabetes.  More often and at a younger age if you are overweight or have a high risk for diabetes. What should I know about preventing infection? Hepatitis B If you have a higher risk for hepatitis B, you should be screened for this virus. Talk with your health care provider to find out if you are at risk for hepatitis B infection. Hepatitis C Testing is recommended for:  Everyone born from 1945 through 1965.  Anyone with known risk factors for hepatitis C. Sexually transmitted infections (STIs)  Get screened for STIs, including gonorrhea and chlamydia, if: ? You are sexually active and are younger than 60 years of age. ? You are older than 60 years of age and your health care provider tells you that you are at risk for this type of infection. ? Your sexual activity has changed since you were last screened, and you are at increased risk for chlamydia or gonorrhea. Ask your health care provider   if you are at risk.  Ask your health care provider about whether you are at high risk for HIV. Your health care provider may recommend a prescription medicine to help prevent HIV infection. If you choose to take medicine to prevent HIV, you should first get tested for HIV. You should then be tested every 3 months for as long as you are taking the medicine. Pregnancy  If you are about to stop having your period (premenopausal) and  you may become pregnant, seek counseling before you get pregnant.  Take 400 to 800 micrograms (mcg) of folic acid every day if you become pregnant.  Ask for birth control (contraception) if you want to prevent pregnancy. Osteoporosis and menopause Osteoporosis is a disease in which the bones lose minerals and strength with aging. This can result in bone fractures. If you are 65 years old or older, or if you are at risk for osteoporosis and fractures, ask your health care provider if you should:  Be screened for bone loss.  Take a calcium or vitamin D supplement to lower your risk of fractures.  Be given hormone replacement therapy (HRT) to treat symptoms of menopause. Follow these instructions at home: Lifestyle  Do not use any products that contain nicotine or tobacco, such as cigarettes, e-cigarettes, and chewing tobacco. If you need help quitting, ask your health care provider.  Do not use street drugs.  Do not share needles.  Ask your health care provider for help if you need support or information about quitting drugs. Alcohol use  Do not drink alcohol if: ? Your health care provider tells you not to drink. ? You are pregnant, may be pregnant, or are planning to become pregnant.  If you drink alcohol: ? Limit how much you use to 0-1 drink a day. ? Limit intake if you are breastfeeding.  Be aware of how much alcohol is in your drink. In the [place] one drink equals one 12 oz bottle of beer (355 mL), one 5 oz glass of wine (148 mL), or one 1 oz glass of hard liquor (44 mL). General instructions  Schedule regular health, dental, and eye exams.  Stay current with your vaccines.  Tell your health care provider if: ? You often feel depressed. ? You have ever been abused or do not feel safe at home. Summary  Adopting a healthy lifestyle and getting preventive care are important in promoting health and wellness.  Follow your health care provider's instructions about healthy  diet, exercising, and getting tested or screened for diseases.  Follow your health care provider's instructions on monitoring your cholesterol and blood pressure. This information is not intended to replace advice given to you by your health care provider. Make sure you discuss any questions you have with your health care provider. Document Revised: 11/09/2018 Document Reviewed: 11/09/2018 Elsevier Patient Education  2021 [employer]  

## 2021-02-27 NOTE — Progress Notes (Signed)
Subjective:  Patient ID: [name], female    DOB: 10-Oct-1961  Age: 60 y.o. MRN: [id number]  CC: Gynecologic Exam   HPI [name] is a 60 year old female with a history of tobacco abuse, bipolar disorder, HER -2 positive pT4b pN2a, M1 stage IIIB-status invasive ductal carcinoma with negative margins, (s/post left mastectomy and axillary lymph node dissection with expander placement) s/p suboccipital craniectomy for resection of posterior fossa metastasis, s/p radiation to the brain and breasthere for a complete physical exam. Her last mammogram ordered by the cancer center was negative for malignancy in 01/2020.  She is due for Pap smear. Last colonoscopy was in 2019 with findings of sessile polyp and per GI recommendation was for repeat in 5 years. For the last 2 weeks on her labia majora she has had burning and used Metrogel and A and D ointment.  Past Medical History:  Diagnosis Date  . Alcohol abuse    none since 2013  . Anemia    during chemo  . Anxiety    At age 63  . Arthritis Dx 2010  . Bipolar disorder ([place])   . Bronchitis   . Cancer ([place])    breast mets to brain  . CHF (congestive heart failure) ([place])   . Chronic pain    resolved per patient 12/25/19  . Complication of anesthesia   . Depression   . Family history of adverse reaction to anesthesia    MOther had PONV  . GERD (gastroesophageal reflux disease)   . Headache    hx  migraines  . HLD (hyperlipidemia)   . Hypertension   . Lymphedema of left arm   . Opiate dependence ([place])   . PONV (postoperative nausea and vomiting)   . Port-A-Cath in place   . PTSD (post-traumatic stress disorder)   . S/P endometrial ablation    in md's office    Past Surgical History:  Procedure Laterality Date  . APPLICATION OF CRANIAL NAVIGATION N/A 08/14/2016   Procedure: APPLICATION OF CRANIAL NAVIGATION;  Surgeon: Erline Levine, MD;  Location: [facility];  Service: Neurosurgery;  Laterality: N/A;  . BREAST  RECONSTRUCTION Left    with silicone implant  . COLONOSCOPY W/ POLYPECTOMY    . CRANIOTOMY N/A 08/14/2016   Procedure: CRANIOTOMY TUMOR EXCISION WITH Lucky Rathke;  Surgeon: Erline Levine, MD;  Location: [facility];  Service: Neurosurgery;  Laterality: N/A;  . FIBULA FRACTURE SURGERY Left   . MASTECTOMY Left   . RADIOLOGY WITH ANESTHESIA N/A 07/23/2016   Procedure: MRI OF BRAIN WITH AND WITHOUT;  Surgeon: Medication Radiologist, MD;  Location: [facility];  Service: Radiology;  Laterality: N/A;  . RADIOLOGY WITH ANESTHESIA N/A 09/08/2016   Procedure: MRI OF BRAIN WITH AND WITHOUT CONTRAST;  Surgeon: Medication Radiologist, MD;  Location: [facility];  Service: Radiology;  Laterality: N/A;  . RADIOLOGY WITH ANESTHESIA N/A 12/10/2016   Procedure: MRI OF BRAIN WITH AND WITHOUT;  Surgeon: Medication Radiologist, MD;  Location: [facility];  Service: Radiology;  Laterality: N/A;  . RADIOLOGY WITH ANESTHESIA N/A 03/02/2017   Procedure: MRI of BRAIN W and W/OUT CONTRAST;  Surgeon: Medication Radiologist, MD;  Location: [facility];  Service: Radiology;  Laterality: N/A;  . RADIOLOGY WITH ANESTHESIA N/A 07/29/2017   Procedure: RADIOLOGY WITH ANESTHESIA MRI OF BRAIN WITH AND WITHOUT CONTRAST;  Surgeon: Radiologist, Medication, MD;  Location: [facility];  Service: Radiology;  Laterality: N/A;  . RADIOLOGY WITH ANESTHESIA N/A 12/07/2017   Procedure: MRI WITH ANESTHESIA OF BRAIN WITH  AND WITHOUT CONTRAST;  Surgeon: Radiologist, Medication, MD;  Location: [facility];  Service: Radiology;  Laterality: N/A;  . RADIOLOGY WITH ANESTHESIA N/A 04/07/2018   Procedure: MRI OF BRAIN WITH AND WITHOUT CONTRAST;  Surgeon: Radiologist, Medication, MD;  Location: [facility];  Service: Radiology;  Laterality: N/A;  . RADIOLOGY WITH ANESTHESIA N/A 08/23/2018   Procedure: MRI WITH ANESTHESIA OF THE BRAIN WITH AND WITHOUT;  Surgeon: Radiologist, Medication, MD;  Location: [facility];  Service: Radiology;  Laterality: N/A;  . RADIOLOGY WITH ANESTHESIA N/A 01/24/2019   Procedure:  MRI OF BRAIN WITH AND WITHOUT CONTRAST;  Surgeon: Radiologist, Medication, MD;  Location: [facility];  Service: Radiology;  Laterality: N/A;  . RADIOLOGY WITH ANESTHESIA N/A 07/06/2019   Procedure: MRI WITH ANESTHESIA OF BRAIN WITH AND WITHOUT CONTRAST;  Surgeon: Radiologist, Medication, MD;  Location: [facility];  Service: Radiology;  Laterality: N/A;  . RADIOLOGY WITH ANESTHESIA N/A 01/11/2020   Procedure: MRI WITH ANESTHESIA BRAIN WITH AND WITHOUT;  Surgeon: Radiologist, Medication, MD;  Location: [facility];  Service: Radiology;  Laterality: N/A;  . RADIOLOGY WITH ANESTHESIA N/A 05/21/2020   Procedure: MRI WITH ANESTHESIA OF BRAIN WITH AND WITHOUT CONTRAST;  Surgeon: Radiologist, Medication, MD;  Location: [facility];  Service: Radiology;  Laterality: N/A;  . RADIOLOGY WITH ANESTHESIA N/A 01/02/2021   Procedure: MRI WITH ANESTHESIA  BRAIN WITH AND WITHOUT CONTRAST,TOTAL SPINE MET SCREENING;  Surgeon: Radiologist, Medication, MD;  Location: [facility];  Service: Radiology;  Laterality: N/A;  . right power port placement Right     Family History  Problem Relation Age of Onset  . Diabetes Mother   . Bipolar disorder Mother   . CAD Father     Allergies  Allergen Reactions  . Demerol [Meperidine Hcl] Itching and Nausea And Vomiting    INTOLERANCE >  N & V  . Erythromycin Rash    Outpatient Medications Prior to Visit  Medication Sig Dispense Refill  . acetaminophen (TYLENOL) 500 MG tablet Take 2 tablets (1,000 mg total) by mouth every 8 (eight) hours as needed for moderate pain. 100 tablet 6  . anastrozole (ARIMIDEX) 1 MG tablet Take 1 tablet (1 mg total) by mouth daily. 90 tablet 1  . azelastine (ASTELIN) 0.1 % nasal spray Place 2 sprays into both nostrils 2 (two) times daily. Use in each nostril as directed (Patient taking differently: Place 2 sprays into both nostrils daily. Use in each nostril as directed) 30 mL 12  . cholecalciferol (VITAMIN D3) 25 MCG (1000 UNIT) tablet Take 2,000 Units by mouth daily.    [name]  docusate sodium (COLACE) 100 MG capsule Take 1 capsule (100 mg total) by mouth daily. 100 capsule 6  . fluticasone (FLONASE) 50 MCG/ACT nasal spray SPRAY 2 SPRAYS INTO EACH NOSTRIL EVERY DAY 16 mL 2  . ibuprofen (ADVIL) 800 MG tablet Take 1 tablet (800 mg total) by mouth 2 (two) times daily as needed for moderate pain. Take with food. (Patient taking differently: Take 800 mg by mouth in the morning and at bedtime. Take with food.) 60 tablet 6  . lamoTRIgine (LAMICTAL) 100 MG tablet Take 100 mg by mouth every morning.    . loratadine (CLARITIN) 10 MG tablet Take 1 tablet (10 mg total) by mouth daily. 90 tablet 3  . Lurasidone HCl 60 MG TABS Take 60 mg by mouth daily.    . ondansetron (ZOFRAN) 8 MG tablet TAKE 1 TABLET (8 MG TOTAL) BY MOUTH EVERY 8 (EIGHT) HOURS AS NEEDED FOR NAUSEA OR  VOMITING. 90 tablet 1  . pantoprazole (PROTONIX) 40 MG tablet Take 1 tablet (40 mg total) by mouth daily. 90 tablet 3  . polyethylene glycol (MIRALAX / GLYCOLAX) 17 g packet Take 17 g by mouth daily as needed for mild constipation. (Patient taking differently: Take 17 g by mouth every 3 (three) days.) 30 each 6  . traZODone (DESYREL) 100 MG tablet Take 1 tablet (100 mg total) by mouth at bedtime. (Patient taking differently: Take 300 mg by mouth at bedtime.) 30 tablet 0  . cyclobenzaprine (FLEXERIL) 10 MG tablet Take 1 tablet (10 mg total) by mouth 2 (two) times daily. 30 tablet 2  . carvedilol (COREG) 3.125 MG tablet TAKE 1 TABLET (3.125 MG TOTAL) BY MOUTH 2 TIMES DAILY FOR 10 DAYS, THEN 2 TABLETS 2 TIMES DAILY. (Patient not taking: Reported on 02/27/2021) 120 tablet 3  . ketorolac (ACULAR) 0.5 % ophthalmic solution Place 1 drop into the left eye 4 (four) times daily. (Patient not taking: Reported on 02/27/2021)    . metroNIDAZOLE (METROGEL VAGINAL) 0.75 % vaginal gel Place 1 Applicatorful vaginally 2 (two) times daily. (Patient not taking: Reported on 02/27/2021) 70 g 0  . PREDNISOLONE ACETATE P-F 1 % ophthalmic suspension  Place 1 drop into the left eye 4 (four) times daily. (Patient not taking: Reported on 02/27/2021)    . rosuvastatin (CRESTOR) 10 MG tablet Take 1 tablet (10 mg total) by mouth daily. 90 tablet 3  . VIGAMOX 0.5 % ophthalmic solution Place 1 drop into the left eye in the morning, at noon, in the evening, and at bedtime. (Patient not taking: Reported on 02/27/2021)     No facility-administered medications prior to visit.     ROS Review of Systems  Constitutional: Negative for activity change, appetite change and fatigue.  HENT: Negative for congestion, sinus pressure and sore throat.   Eyes: Negative for visual disturbance.  Respiratory: Negative for cough, chest tightness, shortness of breath and wheezing.   Cardiovascular: Negative for chest pain and palpitations.  Gastrointestinal: Negative for abdominal distention, abdominal pain and constipation.  Endocrine: Negative for polydipsia.  Genitourinary: Negative for dysuria and frequency.  Musculoskeletal: Negative for arthralgias and back pain.  Skin: Negative for rash.  Neurological: Negative for tremors, light-headedness and numbness.  Hematological: Does not bruise/bleed easily.  Psychiatric/Behavioral: Negative for agitation and behavioral problems.    Objective:  BP 112/76   Pulse 90   Ht 5' 5"  (1.651 m)   Wt 162 lb 9.6 oz (73.8 kg)   LMP  (LMP Unknown) Comment: ablation  SpO2 100%   BMI 27.06 kg/m   BP/Weight 02/27/2021 02/13/2021 6/56/8127  Systolic BP 517 001 749  Diastolic BP 76 80 67  Wt. (Lbs) 162.6 165.8 162.4  BMI 27.06 27.59 27.02  Some encounter information is confidential and restricted. Go to Review Flowsheets activity to see all data.      Physical Exam Constitutional:      General: She is not in acute distress.    Appearance: She is well-developed. She is not diaphoretic.  HENT:     Head: Normocephalic.     Right Ear: External ear normal.     Left Ear: External ear normal.     Nose: Nose normal.   Eyes:     Conjunctiva/sclera: Conjunctivae normal.     Pupils: Pupils are equal, round, and reactive to light.  Neck:     Vascular: No JVD.  Cardiovascular:     Rate and Rhythm: Normal rate and regular  rhythm.     Heart sounds: Normal heart sounds. No murmur heard. No gallop.   Pulmonary:     Effort: Pulmonary effort is normal. No respiratory distress.     Breath sounds: Normal breath sounds. No wheezing or rales.  Chest:     Chest wall: No tenderness.  Breasts:     Right: No mass, nipple discharge or tenderness.     Left: Mass (s/p reconstruction) present. No nipple discharge or tenderness.    Abdominal:     General: Bowel sounds are normal. There is no distension.     Palpations: Abdomen is soft. There is no mass.     Tenderness: There is no abdominal tenderness.  Genitourinary:    Comments: External genitalia-2-minute sores on inferior aspect of left labia majora Vagina, cervix, adnexa-normal Musculoskeletal:        General: No tenderness. Normal range of motion.     Cervical back: Normal range of motion.  Skin:    General: Skin is warm and dry.  Neurological:     Mental Status: She is alert and oriented to person, place, and time.     Deep Tendon Reflexes: Reflexes are normal and symmetric.     CMP Latest Ref Rng & Units 02/13/2021 01/23/2021 01/01/2021  Glucose 70 - 99 mg/dL 112(H) 102(H) 80  BUN 6 - 20 mg/dL 10 11 11   Creatinine 0.44 - 1.00 mg/dL 0.97 1.06(H) 1.00  Sodium 135 - 145 mmol/L 141 142 143  Potassium 3.5 - 5.1 mmol/L 3.8 3.9 3.8  Chloride 98 - 111 mmol/L 107 106 108  CO2 22 - 32 mmol/L 27 27 28   Calcium 8.9 - 10.3 mg/dL 8.7(L) 9.1 9.2  Total Protein 6.5 - 8.1 g/dL 6.6 6.5 6.2(L)  Total Bilirubin 0.3 - 1.2 mg/dL <0.2(L) 0.3 0.3  Alkaline Phos 38 - 126 U/L 62 66 56  AST 15 - 41 U/L 21 22 22   ALT 0 - 44 U/L 16 17 16     Lipid Panel     Component Value Date/Time   CHOL 146 10/16/2020 0848   TRIG 91 10/16/2020 0848   HDL 57 10/16/2020 0848   CHOLHDL  2.6 10/16/2020 0848   VLDL 18 10/16/2020 0848   LDLCALC 71 10/16/2020 0848    CBC    Component Value Date/Time   WBC 3.6 (L) 02/13/2021 0844   RBC 3.45 (L) 02/13/2021 0844   HGB 10.5 (L) 02/13/2021 0844   HGB 13.5 07/05/2019 1129   HGB 12.1 10/26/2017 1038   HCT 32.3 (L) 02/13/2021 0844   HCT 35.7 10/26/2017 1038   PLT 94 (L) 02/13/2021 0844   PLT 54 (L) 07/05/2019 1129   PLT 167 10/26/2017 1038   MCV 93.6 02/13/2021 0844   MCV 98.4 10/26/2017 1038   MCH 30.4 02/13/2021 0844   MCHC 32.5 02/13/2021 0844   RDW 12.1 02/13/2021 0844   RDW 13.3 10/26/2017 1038   LYMPHSABS 0.8 02/13/2021 0844   LYMPHSABS 0.5 (L) 10/26/2017 1038   MONOABS 0.3 02/13/2021 0844   MONOABS 0.1 10/26/2017 1038   EOSABS 0.0 02/13/2021 0844   EOSABS 0.0 10/26/2017 1038   BASOSABS 0.0 02/13/2021 0844   BASOSABS 0.0 10/26/2017 1038    Lab Results  Component Value Date   HGBA1C 5.1 12/10/2017    Assessment & Plan:  1. Annual physical exam Counseled on 150 minutes of exercise per week, healthy eating (including decreased daily intake of saturated fats, cholesterol, added sugars, sodium),routine healthcare maintenance.   2. Screening for  cervical cancer - Cytology - PAP([place])  3. Vaginal burning - clotrimazole (LOTRIMIN) 1 % cream; Apply 1 application topically 2 (two) times daily.  Dispense: 30 g; Refill: 0  4.  Screening for breast cancer Mammogram ordered  Meds ordered this encounter  Medications  . cyclobenzaprine (FLEXERIL) 10 MG tablet    Sig: Take 1 tablet (10 mg total) by mouth 2 (two) times daily.    Dispense:  180 tablet    Refill:  1  . clotrimazole (LOTRIMIN) 1 % cream    Sig: Apply 1 application topically 2 (two) times daily.    Dispense:  30 g    Refill:  0    Follow-up: Return in about 6 months (around 08/29/2021) for Chronic disease management.       Charlott Rakes, MD, FAAFP. [name] [place], [place]    02/27/2021, 10:21 AM

## 2021-03-03 ENCOUNTER — Other Ambulatory Visit: Payer: Self-pay | Admitting: Family Medicine

## 2021-03-03 DIAGNOSIS — N9489 Other specified conditions associated with female genital organs and menstrual cycle: Secondary | ICD-10-CM

## 2021-03-03 DIAGNOSIS — N949 Unspecified condition associated with female genital organs and menstrual cycle: Secondary | ICD-10-CM

## 2021-03-03 LAB — CYTOLOGY - PAP
Comment: NEGATIVE
Diagnosis: NEGATIVE
High risk HPV: NEGATIVE

## 2021-03-03 NOTE — Telephone Encounter (Signed)
Requested medication (s) are due for refill today: no  Requested medication (s) are on the active medication list: yes  Last refill:  02/27/2021  Future visit scheduled: no  Notes to clinic: Medication not assigned to a protocol, review manually   Requested Prescriptions  Pending Prescriptions Disp Refills   clotrimazole (LOTRIMIN) 1 % cream [Pharmacy Med Name: CLOTRIMAZOLE 1% TOPICAL CREAM] 30 g 0    Sig: APPLY TO AFFECTED AREA TWICE A DAY      Off-Protocol Failed - 03/03/2021  8:10 AM      Failed - Medication not assigned to a protocol, review manually.      Passed - Valid encounter within last 12 months    Recent Outpatient Visits           4 days ago Annual physical exam   [place], Enobong, MD   1 year ago Acute non-recurrent sinusitis of other sinus   [place], Enobong, MD   1 year ago Other constipation   [facility] Charlott Rakes, MD   1 year ago Metastatic breast cancer [facility])   [place], Enobong, MD   2 years ago Seasonal allergic rhinitis, unspecified trigger   [place], Patrick E, MD

## 2021-03-06 ENCOUNTER — Ambulatory Visit: Payer: Medicaid Other

## 2021-03-06 ENCOUNTER — Other Ambulatory Visit: Payer: Medicaid Other

## 2021-03-06 ENCOUNTER — Ambulatory Visit: Payer: Medicaid Other | Admitting: Adult Health

## 2021-03-07 ENCOUNTER — Telehealth: Payer: Self-pay

## 2021-03-07 NOTE — Telephone Encounter (Signed)
Dr Bonnee Quin DDS called regarding pt who had (L) tooth 19 extracted last year and states it did not heal appropriately and bone is exposed. Dr Bonnee Quin reports this is not bothering the pt; however, would like to know if Dr [name] recommends pt being referred to oral surgeon for the bone extraction that is exposed. This LPN iformed Dr Bonnee Quin I would consult with Dr [name] and we would let her know Monday 4/11 since this is not emergent and it is not bothering pt. Dr Bonnee Quin verbalized thanks and understanding.

## 2021-03-10 NOTE — Progress Notes (Incomplete)
[place]  [phone]) [phone] [phone]) [phone]     ID: [name] DOB: 1961-05-18  MR#: [id number]  OTL#:[id number]  Patient Care Team: Charlott Rakes, MD as PCP - General (Family Medicine) Larey Dresser, MD as PCP - Advanced Heart Failure (Cardiology) Kyung Rudd, MD as Consulting Physician (Radiation Oncology) Erline Levine, MD as Consulting Physician (Neurosurgery) Corena Pilgrim, MD as Consulting Physician (Psychiatry) Mickeal Skinner Acey Lav, MD as Consulting Physician (Psychiatry) Nehemiah Settle, MD as Referring Physician (Plastic Surgery) Irene Limbo, MD as Consulting Physician (Plastic Surgery) Dillingham, Loel Lofty, DO as Attending Physician (Plastic Surgery) Magrinat, Virgie Dad, MD as Consulting Physician (Oncology)   CHIEF COMPLAINT: Metastatic triple positive breast cancer  CURRENT TREATMENT: trastuzumab [name] 21 days], [denosumab/Xgeva (Q6W)], anastrozole   INTERVAL HISTORY: [name] returns today for follow up and treatment of her metastatic triple positive breast cancer.   She continues on trastuzumab, given every 21 days.  We switched back to this for maintenance after she took the TDM 1 for 8 months.   Her most recent echocardiogram on 12/31/2020 showed normal EF of 60-65%.    She also received denosumab/Xgeva every 6 weeks and had no symptoms or complications.  Her last dose was on 01/23/2021.  She continues on anastrozole with good tolerance.    Her most recent CT chest completed 01/21/2021 was stable showing no new or progressive metastatic disease.     REVIEW OF SYSTEMS: [name]    COVID 19 VACCINATION STATUS: Status post [place] x2 with booster September 2021   BREAST CANCER HISTORY: From the original intake note:  [name] was aware of a "lemon sized lump in" her left axilla for about a year before bringing it to medical attention. By then she had developed left breast and left axillary swelling (June 2016). She presented to the  local emergency room and had a chest CT scan 06/06/2015 which showed a nodule in the left breast measuring 0.9 cm and questionable left axillary adenopathy. She then proceeded to bilateral diagnostic mammography and left breast ultrasonography 06/19/2015. There were no prior films for comparison (last mammography 12 years prior).. The breast density was category C. Mammography showed in the left breast upper inner quadrant a 7 cm area including a small mass and significant pleomorphic calcifications. Ultrasonography defined the mass as measuring 1.2 cm. The left axilla appeared unremarkable. There was significant skin edema.  Biopsy of the left breast mass 06/19/2015 showed (SP [id number]) an invasive ductal carcinoma, grade 2, estrogen receptor 83% positive, progesterone receptor 26% positive, and HER-2 amplified by immunohistochemstry with a 3+ reading. The patient had biopsies of a separate area in the left breast August of the same year and this showed atypical ductal hyperplasia. (SP [id number]).  Accordingly after appropriate discussion on 08/21/2015 the patient proceeded to left mastectomy with left axillary sentinel lymph node sampling, which, since the lymph nodes were positive, extended to the procedure to left axillary lymph node dissection. The pathology (SP [id number]) showed an invasive ductal carcinoma, grade 3, measuring in excess of 9 cm. There were also skin satellites, not contiguous with the invasive carcinoma. Margins were clear and ample. There was evidence of lymphovascular invasion. A total of 15 lymph nodes were removed, including 5 sentinel lymph nodes, all of which were positive, so that the final total was 14 out of 15 lymph nodes involved by tumor. There was evidence of extranodal extension. The final pathology was pT4b pN2a, stage IIIB  CA-27-29 and CEA 09/19/2015 were non-informative October 2016.  Unfortunately CT scans of the chest abdomen and pelvis 09/16/2015 showed bony  metastases to the ri/ght scapula, left iliac crest, and also L4 and T-spine. There were questionable liver cysts which on repeat CT scan 03/02/2016 appear to be a little bit more well-defined, possibly a little larger. There were also some possible right upper lobe lung lesions.  Adjuvant treatment consisted of docetaxel, trastuzumab and pertuzumab, with the final (6th) docetaxel dose given 02/11/2016. She continues on trastuzumab and pertuzumab, with the 11th cycle given 05/05/2016. Echocardiogram 02/26/2016 showed an ejection fraction of 55%. She receives denosumab/Xgeva every 4 weeks.. She also receives radiation, started 06/09/201, to be completed 06/26/2016.  Her subsequent history is as detailed below   PAST MEDICAL HISTORY: Past Medical History:  Diagnosis Date  . Alcohol abuse    none since 2013  . Anemia    during chemo  . Anxiety    At age 22  . Arthritis Dx 2010  . Bipolar disorder ([place])   . Bronchitis   . Cancer ([place])    breast mets to brain  . CHF (congestive heart failure) ([place])   . Chronic pain    resolved per patient 12/25/19  . Complication of anesthesia   . Depression   . Family history of adverse reaction to anesthesia    MOther had PONV  . GERD (gastroesophageal reflux disease)   . Headache    hx  migraines  . HLD (hyperlipidemia)   . Hypertension   . Lymphedema of left arm   . Opiate dependence ([place])   . PONV (postoperative nausea and vomiting)   . Port-A-Cath in place   . PTSD (post-traumatic stress disorder)   . S/P endometrial ablation    in md's office    PAST SURGICAL HISTORY: Past Surgical History:  Procedure Laterality Date  . APPLICATION OF CRANIAL NAVIGATION N/A 08/14/2016   Procedure: APPLICATION OF CRANIAL NAVIGATION;  Surgeon: Erline Levine, MD;  Location: [facility];  Service: Neurosurgery;  Laterality: N/A;  . BREAST RECONSTRUCTION Left    with silicone implant  . COLONOSCOPY W/ POLYPECTOMY    . CRANIOTOMY N/A 08/14/2016   Procedure:  CRANIOTOMY TUMOR EXCISION WITH Lucky Rathke;  Surgeon: Erline Levine, MD;  Location: [facility];  Service: Neurosurgery;  Laterality: N/A;  . FIBULA FRACTURE SURGERY Left   . MASTECTOMY Left   . RADIOLOGY WITH ANESTHESIA N/A 07/23/2016   Procedure: MRI OF BRAIN WITH AND WITHOUT;  Surgeon: Medication Radiologist, MD;  Location: [facility];  Service: Radiology;  Laterality: N/A;  . RADIOLOGY WITH ANESTHESIA N/A 09/08/2016   Procedure: MRI OF BRAIN WITH AND WITHOUT CONTRAST;  Surgeon: Medication Radiologist, MD;  Location: [facility];  Service: Radiology;  Laterality: N/A;  . RADIOLOGY WITH ANESTHESIA N/A 12/10/2016   Procedure: MRI OF BRAIN WITH AND WITHOUT;  Surgeon: Medication Radiologist, MD;  Location: [facility];  Service: Radiology;  Laterality: N/A;  . RADIOLOGY WITH ANESTHESIA N/A 03/02/2017   Procedure: MRI of BRAIN W and W/OUT CONTRAST;  Surgeon: Medication Radiologist, MD;  Location: [facility];  Service: Radiology;  Laterality: N/A;  . RADIOLOGY WITH ANESTHESIA N/A 07/29/2017   Procedure: RADIOLOGY WITH ANESTHESIA MRI OF BRAIN WITH AND WITHOUT CONTRAST;  Surgeon: Radiologist, Medication, MD;  Location: [facility];  Service: Radiology;  Laterality: N/A;  . RADIOLOGY WITH ANESTHESIA N/A 12/07/2017   Procedure: MRI WITH ANESTHESIA OF BRAIN WITH AND WITHOUT CONTRAST;  Surgeon: Radiologist, Medication, MD;  Location: [facility];  Service: Radiology;  Laterality: N/A;  . RADIOLOGY  WITH ANESTHESIA N/A 04/07/2018   Procedure: MRI OF BRAIN WITH AND WITHOUT CONTRAST;  Surgeon: Radiologist, Medication, MD;  Location: [facility];  Service: Radiology;  Laterality: N/A;  . RADIOLOGY WITH ANESTHESIA N/A 08/23/2018   Procedure: MRI WITH ANESTHESIA OF THE BRAIN WITH AND WITHOUT;  Surgeon: Radiologist, Medication, MD;  Location: [facility];  Service: Radiology;  Laterality: N/A;  . RADIOLOGY WITH ANESTHESIA N/A 01/24/2019   Procedure: MRI OF BRAIN WITH AND WITHOUT CONTRAST;  Surgeon: Radiologist, Medication, MD;  Location: [facility];  Service: Radiology;   Laterality: N/A;  . RADIOLOGY WITH ANESTHESIA N/A 07/06/2019   Procedure: MRI WITH ANESTHESIA OF BRAIN WITH AND WITHOUT CONTRAST;  Surgeon: Radiologist, Medication, MD;  Location: [facility];  Service: Radiology;  Laterality: N/A;  . RADIOLOGY WITH ANESTHESIA N/A 01/11/2020   Procedure: MRI WITH ANESTHESIA BRAIN WITH AND WITHOUT;  Surgeon: Radiologist, Medication, MD;  Location: [facility];  Service: Radiology;  Laterality: N/A;  . RADIOLOGY WITH ANESTHESIA N/A 05/21/2020   Procedure: MRI WITH ANESTHESIA OF BRAIN WITH AND WITHOUT CONTRAST;  Surgeon: Radiologist, Medication, MD;  Location: [facility];  Service: Radiology;  Laterality: N/A;  . RADIOLOGY WITH ANESTHESIA N/A 01/02/2021   Procedure: MRI WITH ANESTHESIA  BRAIN WITH AND WITHOUT CONTRAST,TOTAL SPINE MET SCREENING;  Surgeon: Radiologist, Medication, MD;  Location: [facility];  Service: Radiology;  Laterality: N/A;  . right power port placement Right     FAMILY HISTORY Family History  Problem Relation Age of Onset  . Diabetes Mother   . Bipolar disorder Mother   . CAD Father   The patient's father still living, age 13, in [place]. He had prostate cancer at some point in the past. The patient's mother died at age 41 from complications of diabetes. The patient had no brothers, 2 sisters. A paternal grandmother had lung cancer in the setting of tobacco abuse. There is no other history of cancer in the family to her knowledge   GYNECOLOGIC HISTORY:  No LMP recorded. Patient has had an ablation. Menarche approximately age 29. First live birth in 3. The patient is GX P2. She underwent endometrial ablation in 2016.   SOCIAL HISTORY: Updated January 2022 The patient is originally from [place]. She has lived in [place] before but more recently was in [place]. She is back here because she could not afford her rent in [place].  She now has has around 1 bedroom apartment on [address].  She is divorced. Her 2 children are [name] who lives  in [place] and works as a Development worker, community, and [name] who also lives in [place] and works as a Catering manager. The patient has a grandchild, Arelia Longest, 58 years old as of July 2019, living in [place] with his mother. The patient also has a grandson born in February 2019, who lives in [place]. The patient has not established herself with a local church yet.    ADVANCED DIRECTIVES: Not in place; at the 06/03/2016 visit the patient was given the appropriate forms to complete and notarize at her discretion   HEALTH MAINTENANCE: Social History   Tobacco Use  . Smoking status: Former Smoker    Packs/day: 1.00    Years: 43.00    Pack years: 43.00    Types: Cigarettes  . Smokeless tobacco: Never Used  . Tobacco comment: 3 day since last cigarette  Vaping Use  . Vaping Use: Some days  Substance Use Topics  . Alcohol use: No    Comment: no ETOH since 08/22/12  . Drug use:  No    Comment: states she's in recovery program for 7 years     Colonoscopy:  PAP:  Bone density:   Allergies  Allergen Reactions  . Demerol [Meperidine Hcl] Itching and Nausea And Vomiting    INTOLERANCE >  N & V  . Erythromycin Rash    Current Outpatient Medications on File Prior to Visit  Medication Sig Dispense Refill  . acetaminophen (TYLENOL) 500 MG tablet TAKE 2 TABLETS BY MOUTH EVERY 8 HOURS AS NEEDED FOR MODERATE PAIN 100 tablet 6  . anastrozole (ARIMIDEX) 1 MG tablet TAKE 1 TABLET BY MOUTH ONCE A DAY 90 tablet 1  . azelastine (ASTELIN) 0.1 % nasal spray Place 2 sprays into both nostrils 2 (two) times daily. Use in each nostril as directed (Patient taking differently: Place 2 sprays into both nostrils daily. Use in each nostril as directed) 30 mL 12  . carvedilol (COREG) 3.125 MG tablet TAKE 1 TABLET (3.125 MG TOTAL) BY MOUTH 2 TIMES DAILY FOR 10 DAYS, THEN 2 TABLETS 2 TIMES DAILY. (Patient not taking: Reported on 02/27/2021) 120 tablet 3  . cholecalciferol (VITAMIN D3) 25 MCG (1000 UNIT) tablet Take 2,000 Units by  mouth daily.    . clotrimazole (LOTRIMIN) 1 % cream APPLY TO AFFECTED AREA TWICE A DAY 30 g 0  . cyclobenzaprine (FLEXERIL) 10 MG tablet Take 1 tablet (10 mg total) by mouth 2 (two) times daily. 180 tablet 1  . docusate sodium (COLACE) 100 MG capsule TAKE 1 CAPSULE BY MOUTH ONCE A DAY 100 capsule 6  . fluticasone (FLONASE) 50 MCG/ACT nasal spray SPRAY 2 SPRAYS INTO EACH NOSTRIL EVERY DAY 16 mL 2  . ibuprofen (ADVIL) 800 MG tablet TAKE 1 TABLET BY MOUTH TWICE A DAY AS NEEDED FOR MODERATE PAIN. TAKE WITH FOOD. (Patient taking differently: Take 800 mg by mouth in the morning and at bedtime. Take with food.) 60 tablet 6  . ketorolac (ACULAR) 0.5 % ophthalmic solution Place 1 drop into the left eye 4 (four) times daily. (Patient not taking: Reported on 02/27/2021)    . lamoTRIgine (LAMICTAL) 100 MG tablet Take 100 mg by mouth every morning.    . loratadine (CLARITIN) 10 MG tablet Take 1 tablet (10 mg total) by mouth daily. 90 tablet 3  . Lurasidone HCl 60 MG TABS Take 60 mg by mouth daily.    . metroNIDAZOLE (METROGEL) 0.75 % vaginal gel PLACE 1 APPLICATORFUL VAGINALLY 2 TIMES DAILY AS DIRECTED (Patient not taking: Reported on 02/27/2021) 70 g 0  . ondansetron (ZOFRAN) 8 MG tablet TAKE 1 TABLET (8 MG TOTAL) BY MOUTH EVERY 8 (EIGHT) HOURS AS NEEDED FOR NAUSEA OR VOMITING. 90 tablet 1  . pantoprazole (PROTONIX) 40 MG tablet Take 1 tablet (40 mg total) by mouth daily. 90 tablet 3  . polyethylene glycol (MIRALAX / GLYCOLAX) 17 g packet Take 17 g by mouth daily as needed for mild constipation. (Patient taking differently: Take 17 g by mouth every 3 (three) days.) 30 each 6  . polyethylene glycol powder (GLYCOLAX/MIRALAX) 17 GM/SCOOP powder MIX 17 GRAMS (1 CAPFUL) IN 4-8 OUNCES OF LIQUID AND TAKE BY MOUTH DAILY AS NEEDED FOR MILD CONSTIPATION. 476 g 6  . PREDNISOLONE ACETATE P-F 1 % ophthalmic suspension Place 1 drop into the left eye 4 (four) times daily. (Patient not taking: Reported on 02/27/2021)    .  rosuvastatin (CRESTOR) 10 MG tablet Take 1 tablet (10 mg total) by mouth daily. 90 tablet 3  . traZODone (DESYREL) 100 MG tablet  Take 1 tablet (100 mg total) by mouth at bedtime. (Patient taking differently: Take 300 mg by mouth at bedtime.) 30 tablet 0  . VIGAMOX 0.5 % ophthalmic solution Place 1 drop into the left eye in the morning, at noon, in the evening, and at bedtime. (Patient not taking: Reported on 02/27/2021)     No current facility-administered medications on file prior to visit.    OBJECTIVE:  There were no vitals filed for this visit.There is no height or weight on file to calculate BMI.  ECOG FS: 2   Sclerae unicteric, EOMs intact Wearing a mask No cervical or supraclavicular adenopathy Lungs no rales or rhonchi Heart regular rate and rhythm Abd soft, nontender, positive bowel sounds MSK no focal spinal tenderness, no upper extremity lymphedema Neuro: nonfocal, well oriented, appropriate affect Breasts:    {GENERAL: Patient is a well appearing female in no acute distress HEENT:  Sclerae anicteric.  No oropharyngeal concerns, no ulcerations or candida. Neck is supple.  NODES:  No cervical, supraclavicular, or axillary lymphadenopathy palpated.  BREAST EXAM:  Right breast benign, left breast s/p mastectomy and reconstruction, no sign of local recurrence. LUNGS:  Clear to auscultation bilaterally.  No wheezes or rhonchi. HEART:  Regular rate and rhythm. No murmur appreciated. ABDOMEN:  Soft, nontender.  Positive, normoactive bowel sounds. No organomegaly palpated. MSK:  No focal spinal tenderness to palpation. Full range of motion bilaterally in the upper extremities. EXTREMITIES:  No peripheral edema.   SKIN:  Clear with no obvious rashes or skin changes. No nail dyscrasia. NEURO:  Nonfocal. Well oriented.  Appropriate affect.}   LAB RESULTS: No results found for: LABCA2  CBC    Component Value Date/Time   WBC 3.6 (L) 02/13/2021 0844   RBC 3.45 (L) 02/13/2021  0844   HGB 10.5 (L) 02/13/2021 0844   HGB 13.5 07/05/2019 1129   HGB 12.1 10/26/2017 1038   HCT 32.3 (L) 02/13/2021 0844   HCT 35.7 10/26/2017 1038   PLT 94 (L) 02/13/2021 0844   PLT 54 (L) 07/05/2019 1129   PLT 167 10/26/2017 1038   MCV 93.6 02/13/2021 0844   MCV 98.4 10/26/2017 1038   MCH 30.4 02/13/2021 0844   MCHC 32.5 02/13/2021 0844   RDW 12.1 02/13/2021 0844   RDW 13.3 10/26/2017 1038   LYMPHSABS 0.8 02/13/2021 0844   LYMPHSABS 0.5 (L) 10/26/2017 1038   MONOABS 0.3 02/13/2021 0844   MONOABS 0.1 10/26/2017 1038   EOSABS 0.0 02/13/2021 0844   EOSABS 0.0 10/26/2017 1038   BASOSABS 0.0 02/13/2021 0844   BASOSABS 0.0 10/26/2017 1038   CMP Latest Ref Rng & Units 02/13/2021 01/23/2021 01/01/2021  Glucose 70 - 99 mg/dL 112(H) 102(H) 80  BUN 6 - 20 mg/dL 10 11 11   Creatinine 0.44 - 1.00 mg/dL 0.97 1.06(H) 1.00  Sodium 135 - 145 mmol/L 141 142 143  Potassium 3.5 - 5.1 mmol/L 3.8 3.9 3.8  Chloride 98 - 111 mmol/L 107 106 108  CO2 22 - 32 mmol/L 27 27 28   Calcium 8.9 - 10.3 mg/dL 8.7(L) 9.1 9.2  Total Protein 6.5 - 8.1 g/dL 6.6 6.5 6.2(L)  Total Bilirubin 0.3 - 1.2 mg/dL <0.2(L) 0.3 0.3  Alkaline Phos 38 - 126 U/L 62 66 56  AST 15 - 41 U/L 21 22 22   ALT 0 - 44 U/L 16 17 16     STUDIES: No results found.   ELIGIBLE FOR AVAILABLE RESEARCH PROTOCOL: no  ASSESSMENT: 60 y.o. [place] woman with stage IV left-sided breast  cancer involving bone and central nervous system  (1) s/p left breast lower inner quadrant biopsy 06/19/2015 for a clinical T2-3 NX invasive ductal carcinoma, grade 2, triple positive.  (2) status post left mastectomy and axillary lymph node dissection  with immediate expander placement 07/18/2015 for an mpT4 pN2,stage IIIB invasive ductal carcinoma, grade 3, with negative margins.  (a) definitive implant exchange to be scheduled in December   METASTATIC DISEASE: October 2016  (3) CT scan of the chest abdomen and pelvis  09/16/2015 shows metastatic lesions in  the right scapula, left iliac crest, L4, and T spine. There were questionable liver cysts, with repeat CT scan 03/02/2016 showing possible right upper lobe lung lesions and possibly increased liver lesions  (a) CT scan of the chest 06/17/2016 shows no active disease in the lungs or liver  (b) Bone scan July 2017 showed no evidence of bony metastatic disease   (c) head CT 07/08/2016 showed a cerebellar lesion, confirmed by MRI 07/23/2016, status post craniotomy 08/14/2016, confirming a metastatic deposit which was estrogen and progesterone receptor negative, HER-2 amplified with a signals ratio of 7.16, number per cell 13.25  (d) CA 27-29 is not informative  (4) received docetaxel every 3 weeks 6 together with trastuzumab and pertuzumab, last docetaxel dose 02/11/2016  (5) adjuvant radiation7/03/2016 to 06/26/2016 at [facility]: 1. The Left chest wall was treated to 23.4 Gy in 13 fractions at 1.8 Gy per fraction. 2. The Left chest wall was boosted to 10 Gy in 5 fractions at 2 Gy per fraction. 3. The Left Sclav/PAB was treated to 23.4 Gy in 13 fractions at 1.8 Gy per fraction.  [Note: Including the patient's treatment in [place] (received 15 fractions per Dr. Maryan Rued near [place], [place]), the patient received 50.4 Gy to the left chest wall and supraclavicular region. ]  (6) started trastuzumab and pertuzumab October 2016, continuing every 3 weeks,  (a) echocardiogram 02/26/2016 showed a well preserved ejection fraction  (b) echocardiogram 07/01/2016 shows an ejection fraction in the 60-65%   (c) pertuzumab discontinued 10/2016 with uncontrolled diarrhea  (d) echocardiogram 11/11/2016 showed an ejection fraction in the 60-65%  (e) echocardiogram 03/03/2017 shows an ejection fraction of 60-65%  (f) echocardiogram on 05/19/2017 shows an ejection fraction of 55-60%  (g) echocardiogram 09/24/2017 shows the ejection fraction in the 60-65%  (h) echocardiogram 02/14/2018 shows an ejection fraction in  the 60-65%  (I) echocardiogram  06/30/2018 shows an ejection fraction in the 55-60%  (m) echocardiogram on 12/08/2018 shows an ejection fraction in the 60-65% range  (7) started denosumab/Xgeva October 2017 given every 4 weeks, transitioned to every 8 weeks beginning 10/11/18 (every 6 weeks while giving TDM1 every 3 weeks)  (a) Xgeva held after 08/21/2020 dose due to denatal concerns  (8) started anastrozole October 2017   (a) bone scan 11/10/2016 shows no active disease  (b) chest CT scan 11/10/2016 stable, with no evidence of active disease  (c) chest CT and bone scan 07/02/2017 show no evidence of active disease  (d) CT scan of the chest with contrast 11/10/2017 shows some left axillary edema, but no evidence of thrombosis or adenopathy in that area, 0.9 cm precarinal lymph and 0.7 cm right upper lobe nodule node; bone lesions stable  (e) CT of the chest 05/04/2018 shows a 1.4 cm right lower paratracheal node which is slightly increased and a new right prevascular mediastinal node measuring 0.7 cm.  Bone lesions are stable.  (f) chest CT on 07/01/2018 shows no definite findings of metastatic  disease in the thorax. Previously noted borderline enlarged low right paratracheal lymph node is stable to slightly decreased in size   (e) chest CT on 12/30/2018 notes mild increase in right paratracheal adneopathy, recommended PET scan.  Pet scan on 01/19/2019 showed a hypermetabolic and enlarged right paratracheal lymph node consistent with breast cancer recurrence.  (f)Trastuzumab discontinued due to February 2020 scans   (g) TDM-1 started on 01/31/2019 given every 21 days.  (h) Chest CT 05/15/2019 shows decrease in mediastinal adenopathy  (i) CT chest on 08/17/2019 shows resolution of thoracic adenopathy  (j) TDM 1 discontinued after 10/11/2019 dose (8 months treatment) because of thrombocytopenia  (k) trastuzumab resumed 11/01/2019, repeated every 21 days  (l) echocardiogram 12/06/2019 showed an ejection  fraction in the 60-65% range  (m) chest CT scan, brain CT and bone scan 01/31/2020 show no evidence of active disease  (n) CT chest with contrast 06/10/2020 showed no evidence of actice/progressive disease  (9) history of bipolar disorder  (a) currently on Lamictal and Latuda as well as Desyrel and Astelin   (10) mild anemia with a significant drop in the MCV, ferritin 10 on 06/03/2016,   (a) Feraheme given 06/12/2016 and 06/18/2016  (11) tobacco abuse: Patient quit August 2021, vaping instead  (12) 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  (a) brain irradiation10/19/17-11/08/17: Whole brain/ 35 Gy in 14 fractions   (b) repeat brain MRI obtained 12/10/2016 shows no active disease in the brain  (c) repeat brain MRI 03/02/2017 shows no evidence of residual or recurrent disease  (d) repeat brain MRI 07/29/2017 shows no evidence of residual or recurrent disease  (e) repeat brain MRI 12/07/2017 shows no evidence of disease recurrence.  There is progressive white matter change secondary to prior treatment.  (f) repeat brain MRI 04/07/2018 showed no evidence of disease\  (g) repeat brain MRI on 08/23/2018 shows no evidence of disease  (h) repeat brain MRI on 01/25/2019 shows no evidence of disease  (I) brain MRI 07/06/2019 shows no evidence of active disease  (j) brain MRI 01/11/2020 shows a subdural hematoma measuring 0.8 cm but no evidence of recurrent metastatic disease  (k) brain MRI 05/21/2020 no evidence of recurrent disease; chronic changes c/w prior treatment and recent subdural (otherwise resolved)   PLAN: [name] is doing well today and has no clinical or radiographic sign of breast cancer progression which is great news.  She will proceed with her Trastuzumab today.  We will continue to treat her every three weeks, and will see her with every other treatment.    She has f/u with Dr. Mickeal Skinner at the end of the year, as her brain metastases are  controlled, and he is planning brain MRI around that time.  [name]'s dental extraction has healed and she can resume [place] today.  I have updated her orders.  We discussed her taking some extra calcium in form of tums.  We sent  Wet prep and I ordered a urinalysis and culture if she is able to leave a sample over in infusion.    [name] is in agreement with the plan.    Addendum: Wet prep shows bacterial vaginosis.  Metrogel 0.75% sent to [facility] outpatient pharmacy for her to insert vaginally BID.  She has used this before without difficulty.  I called and gave this message with her nurse Heidi in infusion.  Total encounter time 30 minutes.[name] C. Magrinat, MD 03/10/21 10:13 PM Medical Oncology and H[facility]ology [facility] [place], [place] [address] Tel. [phone]    Fax. [phone]   I, Wilburn Mylar, am acting as scribe for Dr. Virgie Dad. Magrinat.  {Add scribe attestation statement}   *Total Encounter Time as defined by the Centers for Medicare and Medicaid Services includes, in addition to the face-to-face time of a patient visit (documented in the note above) non-face-to-face time: obtaining and reviewing outside history, ordering and reviewing medications, tests or procedures, care coordination (communications with other health care professionals or caregivers) and documentation in the medical record.

## 2021-03-11 ENCOUNTER — Inpatient Hospital Stay: Payer: Medicaid Other

## 2021-03-11 ENCOUNTER — Other Ambulatory Visit: Payer: Self-pay | Admitting: Oncology

## 2021-03-11 ENCOUNTER — Inpatient Hospital Stay: Payer: Medicaid Other | Admitting: Oncology

## 2021-03-11 DIAGNOSIS — M8710 Osteonecrosis due to drugs, unspecified bone: Secondary | ICD-10-CM | POA: Insufficient documentation

## 2021-03-11 NOTE — Progress Notes (Unsigned)
I was called regarding [name] by Dr. Iantha Fallen, her dentist.  Her number is [phone].  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  She wanted to know if referral to neurosurgeon would be a good idea.  I called back but was not able to reach Dr. Bonnee Quin.  I left a message explaining that treatment of this problem is generally conservative but if it appears that the removal of the area in question can be easily accomplished then referral to an oral surgeon would be appropriate.  Extensive surgery to remove the area is not indicated.

## 2021-03-14 ENCOUNTER — Telehealth: Payer: Self-pay | Admitting: Oncology

## 2021-03-14 NOTE — Telephone Encounter (Signed)
R/s appt, however, provider stated it was okay for pt to be seen on 4/28 instead of trying to r/s. I spoke to the pt and she said she's okay coming in on 4/28 as well. Appt. Confirmed.

## 2021-03-17 ENCOUNTER — Ambulatory Visit: Payer: Medicaid Other

## 2021-03-26 NOTE — Progress Notes (Signed)
[place]  [phone] [phone]     ID: [name] DOB: 1961/04/29  MR#: [id number]  HWE#:[id number]  Patient Care Team: Charlott Rakes, MD as PCP - General (Family Medicine) Larey Dresser, MD as PCP - Advanced Heart Failure (Cardiology) Kyung Rudd, MD as Consulting Physician (Radiation Oncology) Erline Levine, MD as Consulting Physician (Neurosurgery) Corena Pilgrim, MD as Consulting Physician (Psychiatry) Mickeal Skinner Acey Lav, MD as Consulting Physician (Psychiatry) Nehemiah Settle, MD as Referring Physician (Plastic Surgery) Irene Limbo, MD as Consulting Physician (Plastic Surgery) Dillingham, Loel Lofty, DO as Attending Physician (Plastic Surgery) Shanedra Lave, Virgie Dad, MD as Consulting Physician (Oncology) Merdis Delay, DMD (Dentistry)   CHIEF COMPLAINT: Metastatic triple positive breast cancer  CURRENT TREATMENT: trastuzumab [name] 21 days], [denosumab/Xgeva (Q6W)], anastrozole   INTERVAL HISTORY: [name] returns today for follow up and treatment of her metastatic triple positive breast cancer.   She continues on trastuzumab, given every 21 days.  We switched back to this for maintenance after she took the TDM-1 for 8 months.  Recall the anti-HER2 treatment was intensified after PET scan February 2020 showed a hypermetabolic right paratracheal lymph node.  Her most recent echocardiogram on 12/31/2020 showed normal EF of 60-65%.    She also received denosumab/Xgeva every 6 weeks.[name]  However this had been held due to a healing dental extraction. She restarted this at her last visit on 01/23/2021.  But we are not planning to renew it until after she has had oral surgery for her osteonecrosis problem.  She continues on anastrozole with good tolerance except for vaginal dryness issues.  She has already participated in the pelvic rehab program.  Pap smear cytology 02/27/2021 was negative for HPV.  She is scheduled for screening  right breast mammogram on 05/15/2021.   REVIEW OF SYSTEMS: [name] is now living in her own apartment.  She gets transported here by way of [name] and then she has a friend who takes her around sometimes for shopping and other things.  She recently spent time with her son in [place] who is going to be getting married in the summer.  She reminded me today that she has been "dry" more than 7 years and that is a real achievement.  She is having no unusual headaches visual changes nausea or vomiting.  She is pretty wobbly she says even though she has had physical therapy previously for this.  She thinks she would benefit from further physical therapy and I agree.  A detailed review of systems today was otherwise stable   COVID 19 VACCINATION STATUS: Status post Pfizer x2 with booster September 2021   BREAST CANCER HISTORY: From the original intake note:  [name] was aware of a "lemon sized lump in" her left axilla for about a year before bringing it to medical attention. By then she had developed left breast and left axillary swelling (June 2016). She presented to the local emergency room and had a chest CT scan 06/06/2015 which showed a nodule in the left breast measuring 0.9 cm and questionable left axillary adenopathy. She then proceeded to bilateral diagnostic mammography and left breast ultrasonography 06/19/2015. There were no prior films for comparison (last mammography 12 years prior).. The breast density was category C. Mammography showed in the left breast upper inner quadrant a 7 cm area including a small mass and significant pleomorphic calcifications. Ultrasonography defined the mass as measuring 1.2 cm. The left axilla appeared unremarkable. There was significant skin edema.  Biopsy of the left  breast mass 06/19/2015 showed (SP [phone]) an invasive ductal carcinoma, grade 2, estrogen receptor 83% positive, progesterone receptor 26% positive, and HER-2 amplified by immunohistochemstry with a 3+  reading. The patient had biopsies of a separate area in the left breast August of the same year and this showed atypical ductal hyperplasia. (SP [id number]).  Accordingly after appropriate discussion on 08/21/2015 the patient proceeded to left mastectomy with left axillary sentinel lymph node sampling, which, since the lymph nodes were positive, extended to the procedure to left axillary lymph node dissection. The pathology (SP [id number]) showed an invasive ductal carcinoma, grade 3, measuring in excess of 9 cm. There were also skin satellites, not contiguous with the invasive carcinoma. Margins were clear and ample. There was evidence of lymphovascular invasion. A total of 15 lymph nodes were removed, including 5 sentinel lymph nodes, all of which were positive, so that the final total was 14 out of 15 lymph nodes involved by tumor. There was evidence of extranodal extension. The final pathology was pT4b pN2a, stage IIIB  CA-27-29 and CEA 09/19/2015 were non-informative October 2016.  Unfortunately CT scans of the chest abdomen and pelvis 09/16/2015 showed bony metastases to the ri/ght scapula, left iliac crest, and also L4 and T-spine. There were questionable liver cysts which on repeat CT scan 03/02/2016 appear to be a little bit more well-defined, possibly a little larger. There were also some possible right upper lobe lung lesions.  Adjuvant treatment consisted of docetaxel, trastuzumab and pertuzumab, with the final (6th) docetaxel dose given 02/11/2016. She continues on trastuzumab and pertuzumab, with the 11th cycle given 05/05/2016. Echocardiogram 02/26/2016 showed an ejection fraction of 55%. She receives denosumab/Xgeva every 4 weeks.. She also receives radiation, started 06/09/201, to be completed 06/26/2016.  Her subsequent history is as detailed below   PAST MEDICAL HISTORY: Past Medical History:  Diagnosis Date  . Alcohol abuse    none since 2013  . Anemia    during chemo  . Anxiety     At age 31  . Arthritis Dx 2010  . Bipolar disorder ([place])   . Bronchitis   . Cancer ([place])    breast mets to brain  . CHF (congestive heart failure) ([place])   . Chronic pain    resolved per patient 12/25/19  . Complication of anesthesia   . Depression   . Family history of adverse reaction to anesthesia    MOther had PONV  . GERD (gastroesophageal reflux disease)   . Headache    hx  migraines  . HLD (hyperlipidemia)   . Hypertension   . Lymphedema of left arm   . Opiate dependence ([place])   . PONV (postoperative nausea and vomiting)   . Port-A-Cath in place   . PTSD (post-traumatic stress disorder)   . S/P endometrial ablation    in md's office    PAST SURGICAL HISTORY: Past Surgical History:  Procedure Laterality Date  . APPLICATION OF CRANIAL NAVIGATION N/A 08/14/2016   Procedure: APPLICATION OF CRANIAL NAVIGATION;  Surgeon: Erline Levine, MD;  Location: [facility];  Service: Neurosurgery;  Laterality: N/A;  . BREAST RECONSTRUCTION Left    with silicone implant  . COLONOSCOPY W/ POLYPECTOMY    . CRANIOTOMY N/A 08/14/2016   Procedure: CRANIOTOMY TUMOR EXCISION WITH Lucky Rathke;  Surgeon: Erline Levine, MD;  Location: [facility];  Service: Neurosurgery;  Laterality: N/A;  . FIBULA FRACTURE SURGERY Left   . MASTECTOMY Left   . RADIOLOGY WITH ANESTHESIA N/A 07/23/2016   Procedure: MRI  OF BRAIN WITH AND WITHOUT;  Surgeon: Medication Radiologist, MD;  Location: [facility];  Service: Radiology;  Laterality: N/A;  . RADIOLOGY WITH ANESTHESIA N/A 09/08/2016   Procedure: MRI OF BRAIN WITH AND WITHOUT CONTRAST;  Surgeon: Medication Radiologist, MD;  Location: [facility];  Service: Radiology;  Laterality: N/A;  . RADIOLOGY WITH ANESTHESIA N/A 12/10/2016   Procedure: MRI OF BRAIN WITH AND WITHOUT;  Surgeon: Medication Radiologist, MD;  Location: [facility];  Service: Radiology;  Laterality: N/A;  . RADIOLOGY WITH ANESTHESIA N/A 03/02/2017   Procedure: MRI of BRAIN W and W/OUT CONTRAST;  Surgeon: Medication  Radiologist, MD;  Location: [facility];  Service: Radiology;  Laterality: N/A;  . RADIOLOGY WITH ANESTHESIA N/A 07/29/2017   Procedure: RADIOLOGY WITH ANESTHESIA MRI OF BRAIN WITH AND WITHOUT CONTRAST;  Surgeon: Radiologist, Medication, MD;  Location: [facility];  Service: Radiology;  Laterality: N/A;  . RADIOLOGY WITH ANESTHESIA N/A 12/07/2017   Procedure: MRI WITH ANESTHESIA OF BRAIN WITH AND WITHOUT CONTRAST;  Surgeon: Radiologist, Medication, MD;  Location: [facility];  Service: Radiology;  Laterality: N/A;  . RADIOLOGY WITH ANESTHESIA N/A 04/07/2018   Procedure: MRI OF BRAIN WITH AND WITHOUT CONTRAST;  Surgeon: Radiologist, Medication, MD;  Location: [facility];  Service: Radiology;  Laterality: N/A;  . RADIOLOGY WITH ANESTHESIA N/A 08/23/2018   Procedure: MRI WITH ANESTHESIA OF THE BRAIN WITH AND WITHOUT;  Surgeon: Radiologist, Medication, MD;  Location: [facility];  Service: Radiology;  Laterality: N/A;  . RADIOLOGY WITH ANESTHESIA N/A 01/24/2019   Procedure: MRI OF BRAIN WITH AND WITHOUT CONTRAST;  Surgeon: Radiologist, Medication, MD;  Location: [facility];  Service: Radiology;  Laterality: N/A;  . RADIOLOGY WITH ANESTHESIA N/A 07/06/2019   Procedure: MRI WITH ANESTHESIA OF BRAIN WITH AND WITHOUT CONTRAST;  Surgeon: Radiologist, Medication, MD;  Location: [facility];  Service: Radiology;  Laterality: N/A;  . RADIOLOGY WITH ANESTHESIA N/A 01/11/2020   Procedure: MRI WITH ANESTHESIA BRAIN WITH AND WITHOUT;  Surgeon: Radiologist, Medication, MD;  Location: [facility];  Service: Radiology;  Laterality: N/A;  . RADIOLOGY WITH ANESTHESIA N/A 05/21/2020   Procedure: MRI WITH ANESTHESIA OF BRAIN WITH AND WITHOUT CONTRAST;  Surgeon: Radiologist, Medication, MD;  Location: [facility];  Service: Radiology;  Laterality: N/A;  . RADIOLOGY WITH ANESTHESIA N/A 01/02/2021   Procedure: MRI WITH ANESTHESIA  BRAIN WITH AND WITHOUT CONTRAST,TOTAL SPINE MET SCREENING;  Surgeon: Radiologist, Medication, MD;  Location: [facility];  Service: Radiology;  Laterality: N/A;  .  right power port placement Right     FAMILY HISTORY Family History  Problem Relation Age of Onset  . Diabetes Mother   . Bipolar disorder Mother   . CAD Father   The patient's father still living, age 42, in [place]. He had prostate cancer at some point in the past. The patient's mother died at age 45 from complications of diabetes. The patient had no brothers, 2 sisters. A paternal grandmother had lung cancer in the setting of tobacco abuse. There is no other history of cancer in the family to her knowledge   GYNECOLOGIC HISTORY:  No LMP recorded. Patient has had an ablation. Menarche approximately age 12. First live birth in 6. The patient is GX P2. She underwent endometrial ablation in 2016.   SOCIAL HISTORY: (Updated April 2022) The patient is originally from [place]. She moved to this area from [place] because it was less expensive here she says.   She now has has her own 1 bedroom apartment on [address].  She is divorced.  Her 2 children are [name] who lives in [place] and works as a Development worker, community, and Noonday who lives in [place] and works as Freight forwarder other healthcare facility.  [name] has 2 children, Interior and spatial designer and Grayce Sessions.  He is planning to remarry (to [place], who has 2 children of her own plus a step grandson) summer 2022.  [name] has no children.    ADVANCED DIRECTIVES: Not in place; at the 06/03/2016 visit the patient was given the appropriate forms to complete and notarize at her discretion   HEALTH MAINTENANCE: Social History   Tobacco Use  . Smoking status: Former Smoker    Packs/day: 1.00    Years: 43.00    Pack years: 43.00    Types: Cigarettes  . Smokeless tobacco: Never Used  . Tobacco comment: 3 day since last cigarette  Vaping Use  . Vaping Use: Some days  Substance Use Topics  . Alcohol use: No    Comment: no ETOH since 08/22/12  . Drug use: No    Comment: states she's in recovery program for 7 years     Colonoscopy:  PAP:  Bone  density:   Allergies  Allergen Reactions  . Demerol [Meperidine Hcl] Itching and Nausea And Vomiting    INTOLERANCE >  N & V  . Erythromycin Rash    Current Outpatient Medications on File Prior to Visit  Medication Sig Dispense Refill  . anastrozole (ARIMIDEX) 1 MG tablet TAKE 1 TABLET BY MOUTH ONCE A DAY 90 tablet 1  . azelastine (ASTELIN) 0.1 % nasal spray Place 2 sprays into both nostrils 2 (two) times daily. Use in each nostril as directed (Patient taking differently: Place 2 sprays into both nostrils daily. Use in each nostril as directed) 30 mL 12  . carvedilol (COREG) 3.125 MG tablet TAKE 1 TABLET (3.125 MG TOTAL) BY MOUTH 2 TIMES DAILY FOR 10 DAYS, THEN 2 TABLETS 2 TIMES DAILY. (Patient not taking: Reported on 02/27/2021) 120 tablet 3  . cyclobenzaprine (FLEXERIL) 10 MG tablet Take 1 tablet (10 mg total) by mouth 2 (two) times daily. 180 tablet 1  . fluticasone (FLONASE) 50 MCG/ACT nasal spray SPRAY 2 SPRAYS INTO EACH NOSTRIL EVERY DAY 16 mL 2  . ibuprofen (ADVIL) 800 MG tablet TAKE 1 TABLET BY MOUTH TWICE A DAY AS NEEDED FOR MODERATE PAIN. TAKE WITH FOOD. (Patient taking differently: Take 800 mg by mouth in the morning and at bedtime. Take with food.) 60 tablet 6  . ketorolac (ACULAR) 0.5 % ophthalmic solution Place 1 drop into the left eye 4 (four) times daily. (Patient not taking: Reported on 02/27/2021)    . lamoTRIgine (LAMICTAL) 100 MG tablet Take 100 mg by mouth every morning.    . loratadine (CLARITIN) 10 MG tablet Take 1 tablet (10 mg total) by mouth daily. 90 tablet 3  . Lurasidone HCl 60 MG TABS Take 60 mg by mouth daily.    . ondansetron (ZOFRAN) 8 MG tablet TAKE 1 TABLET (8 MG TOTAL) BY MOUTH EVERY 8 (EIGHT) HOURS AS NEEDED FOR NAUSEA OR VOMITING. 90 tablet 1  . pantoprazole (PROTONIX) 40 MG tablet Take 1 tablet (40 mg total) by mouth daily. 90 tablet 3  . polyethylene glycol (MIRALAX / GLYCOLAX) 17 g packet Take 17 g by mouth daily as needed for mild constipation. (Patient  taking differently: Take 17 g by mouth every 3 (three) days.) 30 each 6  . rosuvastatin (CRESTOR) 10 MG tablet Take 1 tablet (10 mg total) by mouth daily. 90 tablet 3  .  traZODone (DESYREL) 100 MG tablet Take 1 tablet (100 mg total) by mouth at bedtime. (Patient taking differently: Take 300 mg by mouth at bedtime.) 30 tablet 0   No current facility-administered medications on file prior to visit.    OBJECTIVE: White woman who appears stated age 60:   03/27/21 1002  BP: 129/68  Pulse: 86  Resp: 18  Temp: (!) 97.4 F (36.3 C)  TempSrc: Tympanic  SpO2: 99%  Weight: 165 lb 1.6 oz (74.9 kg)  Height: $Remove'5\' 5"'HoSBhag$  (1.651 m)  Body mass index is 27.47 kg/m.  ECOG FS: 2   Sclerae unicteric, EOMs intact Wearing a mask No cervical or supraclavicular adenopathy Lungs no rales or rhonchi Heart regular rate and rhythm Abd soft, nontender, positive bowel sounds MSK no focal spinal tenderness, no upper extremity lymphedema Neuro: nonfocal, well oriented, appropriate affect Breasts: The right breast is unremarkable.  The left breast is status post mastectomy with saline reconstruction.  There is no evidence of local recurrence.  Both axillae are benign.  LAB RESULTS: No results found for: LABCA2  CBC    Component Value Date/Time   WBC 3.8 (L) 03/27/2021 0941   RBC 3.54 (L) 03/27/2021 0941   HGB 10.5 (L) 03/27/2021 0941   HGB 13.5 07/05/2019 1129   HGB 12.1 10/26/2017 1038   HCT 32.3 (L) 03/27/2021 0941   HCT 35.7 10/26/2017 1038   PLT 116 (L) 03/27/2021 0941   PLT 54 (L) 07/05/2019 1129   PLT 167 10/26/2017 1038   MCV 91.2 03/27/2021 0941   MCV 98.4 10/26/2017 1038   MCH 29.7 03/27/2021 0941   MCHC 32.5 03/27/2021 0941   RDW 12.6 03/27/2021 0941   RDW 13.3 10/26/2017 1038   LYMPHSABS 0.9 03/27/2021 0941   LYMPHSABS 0.5 (L) 10/26/2017 1038   MONOABS 0.3 03/27/2021 0941   MONOABS 0.1 10/26/2017 1038   EOSABS 0.0 03/27/2021 0941   EOSABS 0.0 10/26/2017 1038   BASOSABS 0.0  03/27/2021 0941   BASOSABS 0.0 10/26/2017 1038   CMP Latest Ref Rng & Units 02/13/2021 01/23/2021 01/01/2021  Glucose 70 - 99 mg/dL 112(H) 102(H) 80  BUN 6 - 20 mg/dL $Remove'10 11 11  'EHgxEhu$ Creatinine 0.44 - 1.00 mg/dL 0.97 1.06(H) 1.00  Sodium 135 - 145 mmol/L 141 142 143  Potassium 3.5 - 5.1 mmol/L 3.8 3.9 3.8  Chloride 98 - 111 mmol/L 107 106 108  CO2 22 - 32 mmol/L $RemoveB'27 27 28  'yYizECSD$ Calcium 8.9 - 10.3 mg/dL 8.7(L) 9.1 9.2  Total Protein 6.5 - 8.1 g/dL 6.6 6.5 6.2(L)  Total Bilirubin 0.3 - 1.2 mg/dL <0.2(L) 0.3 0.3  Alkaline Phos 38 - 126 U/L 62 66 56  AST 15 - 41 U/L $Remo'21 22 22  'PUSoI$ ALT 0 - 44 U/L $Remo'16 17 16    'ydoXq$ STUDIES: No results found.   ELIGIBLE FOR AVAILABLE RESEARCH PROTOCOL: no  ASSESSMENT: 59 y.o. [place] woman with stage IV left-sided breast cancer involving bone and central nervous system  (1) s/p left breast lower inner quadrant biopsy 06/19/2015 for a clinical T2-3 NX invasive ductal carcinoma, grade 2, triple positive.  (2) status post left mastectomy and axillary lymph node dissection  with immediate expander placement 07/18/2015 for an mpT4 pN2,stage IIIB invasive ductal carcinoma, grade 3, with negative margins.  (a) definitive implant exchange to be scheduled in December   METASTATIC DISEASE: October 2016  (3) CT scan of the chest abdomen and pelvis  09/16/2015 shows metastatic lesions in the right scapula, left iliac crest, L4, and  T spine. There were questionable liver cysts, with repeat CT scan 03/02/2016 showing possible right upper lobe lung lesions and possibly increased liver lesions  (a) CT scan of the chest 06/17/2016 shows no active disease in the lungs or liver  (b) Bone scan July 2017 showed no evidence of bony metastatic disease   (c) head CT 07/08/2016 showed a cerebellar lesion, confirmed by MRI 07/23/2016, status post craniotomy 08/14/2016, confirming a metastatic deposit which was estrogen and progesterone receptor negative, HER-2 amplified with a signals ratio of 7.16,  number per cell 13.25  (d) CA 27-29 is not informative  (4) received docetaxel every 3 weeks 6 together with trastuzumab and pertuzumab, last docetaxel dose 02/11/2016  (5) adjuvant radiation7/03/2016 to 06/26/2016 at [facility]: 1. The Left chest wall was treated to 23.4 Gy in 13 fractions at 1.8 Gy per fraction. 2. The Left chest wall was boosted to 10 Gy in 5 fractions at 2 Gy per fraction. 3. The Left Sclav/PAB was treated to 23.4 Gy in 13 fractions at 1.8 Gy per fraction.  [Note: Including the patient's treatment in [place] (received 15 fractions per Dr. Maryan Rued near [place], [place]), the patient received 50.4 Gy to the left chest wall and supraclavicular region. ]  (6) started trastuzumab and pertuzumab October 2016, continuing every 3 weeks,  (a) echocardiogram 02/26/2016 showed a well preserved ejection fraction  (b) echocardiogram 07/01/2016 shows an ejection fraction in the 60-65%   (c) pertuzumab discontinued 10/2016 with uncontrolled diarrhea  (d) echocardiogram 11/11/2016 showed an ejection fraction in the 60-65%  (e) echocardiogram 03/03/2017 shows an ejection fraction of 60-65%  (f) echocardiogram on 05/19/2017 shows an ejection fraction of 55-60%  (g) echocardiogram 09/24/2017 shows the ejection fraction in the 60-65%  (h) echocardiogram 02/14/2018 shows an ejection fraction in the 60-65%  (I) echocardiogram  06/30/2018 shows an ejection fraction in the 55-60%  (m) echocardiogram on 12/08/2018 shows an ejection fraction in the 60-65% range  (7) started denosumab/Xgeva October 2017 given every 4 weeks, transitioned to every 8 weeks beginning 10/11/18 (every 6 weeks while giving TDM1 every 3 weeks)  (a) Xgeva held after 08/21/2020 dose due to dental concerns  (8) started anastrozole October 2017   (a) bone scan 11/10/2016 shows no active disease  (b) chest CT scan 11/10/2016 stable, with no evidence of active disease  (c) chest CT and bone scan 07/02/2017 show no evidence  of active disease  (d) CT scan of the chest with contrast 11/10/2017 shows some left axillary edema, but no evidence of thrombosis or adenopathy in that area, 0.9 cm precarinal lymph and 0.7 cm right upper lobe nodule node; bone lesions stable  (e) CT of the chest 05/04/2018 shows a 1.4 cm right lower paratracheal node which is slightly increased and a new right prevascular mediastinal node measuring 0.7 cm.  Bone lesions are stable.  (f) chest CT on 07/01/2018 shows no definite findings of metastatic disease in the thorax. Previously noted borderline enlarged low right paratracheal lymph node is stable to slightly decreased in size   (e) chest CT on 12/30/2018 notes mild increase in right paratracheal adneopathy, recommended PET scan.  Pet scan on 01/19/2019 showed a hypermetabolic and enlarged right paratracheal lymph node consistent with breast cancer recurrence.  (f)Trastuzumab discontinued due to February 2020 scans   (g) TDM-1 started on 01/31/2019 given every 21 days.  (h) Chest CT 05/15/2019 shows decrease in mediastinal adenopathy  (i) CT chest on 08/17/2019 shows resolution of thoracic adenopathy  (j) TDM  1 discontinued after 10/11/2019 dose (8 months treatment) because of thrombocytopenia  (k) trastuzumab resumed 11/01/2019, repeated every 21 days  (l) echocardiogram 12/06/2019 showed an ejection fraction in the 60-65% range  (m) chest CT scan, brain CT and bone scan 01/31/2020 show no evidence of active disease  (n) CT chest with contrast 06/10/2020 showed no evidence of actice/progressive disease  (9) history of bipolar disorder  (a) currently on Lamictal and Latuda as well as Desyrel and Astelin   (10) mild anemia with a significant drop in the MCV, ferritin 10 on 06/03/2016,   (a) Feraheme given 06/12/2016 and 06/18/2016  (11) tobacco abuse: Patient quit August 2021, vaping instead  (12) 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  (a) brain  irradiation10/19/17-11/08/17: Whole brain/ 35 Gy in 14 fractions   (b) repeat brain MRI obtained 12/10/2016 shows no active disease in the brain  (c) repeat brain MRI 03/02/2017 shows no evidence of residual or recurrent disease  (d) repeat brain MRI 07/29/2017 shows no evidence of residual or recurrent disease  (e) repeat brain MRI 12/07/2017 shows no evidence of disease recurrence.  There is progressive white matter change secondary to prior treatment.  (f) repeat brain MRI 04/07/2018 showed no evidence of disease\  (g) repeat brain MRI on 08/23/2018 shows no evidence of disease  (h) repeat brain MRI on 01/25/2019 shows no evidence of disease  (I) brain MRI 07/06/2019 shows no evidence of active disease  (j) brain MRI 01/11/2020 shows a subdural hematoma measuring 0.8 cm but no evidence of recurrent metastatic disease  (k) brain MRI 05/21/2020 no evidence of recurrent disease; chronic changes c/w prior treatment and recent subdural (otherwise resolved)  (l) brain MRI 01/02/2021 shows no evidence of recurrent disease.  There is some dural thickening in the area of prior subdural hematoma and continued follow-up is recommended.  (13) peripheral disease monitoring:  (a) MRI total spine 01/03/2021 shows no enhancing bony mets, prior sclerotic lesions not well visualized  (b) CT of the chest 01/20/2021 stable, with unchanged bony metastasis, scattered subcentimeter low-attenuation liver lesions too small to characterize, and no measurable disease.   PLAN: Danne is now 5-1/2 years out from definitive diagnosis of metastatic breast cancer.  Her disease is very well controlled and currently she has no symptoms related to the disease itself.  She is also tolerating treatment well.  She does have vaginal dryness issues related to the anastrozole.  She has been 5 years on this medication and the plan is to continue it for 2 more years.  She is tolerating the trastuzumab well.  She had her most recent echo  in February and will be due for repeat echo in August.  Those orders have been placed.  We are going to hold the denosumab/Xgeva until she has the osteonecrosis problem taken care of.  She tells me her dentist will be referring her to an oral surgeon.  I favor that moved.  She will see me again in September.  We will do a PET scan prior to that visit.  Total encounter time 55 minutes.Sarajane Jews C. Aissa Lisowski, MD 03/27/21 10:29 AM Medical Oncology and Hematology [facility] [place], [place] [address] Tel. [phone]    Fax. [phone]   I, Wilburn Mylar, am acting as scribe for Dr. Virgie Dad. Arlon Bleier.  I, Lurline Del MD, have reviewed the above documentation for accuracy and completeness, and I agree with the above.    *Total  Encounter Time as defined by the Centers for Medicare and Medicaid Services includes, in addition to the face-to-face time of a patient visit (documented in the note above) non-face-to-face time: obtaining and reviewing outside history, ordering and reviewing medications, tests or procedures, care coordination (communications with other health care professionals or caregivers) and documentation in the medical record.

## 2021-03-27 ENCOUNTER — Other Ambulatory Visit: Payer: Self-pay

## 2021-03-27 ENCOUNTER — Other Ambulatory Visit: Payer: Medicaid Other

## 2021-03-27 ENCOUNTER — Inpatient Hospital Stay: Payer: Medicaid Other

## 2021-03-27 ENCOUNTER — Inpatient Hospital Stay: Payer: Medicaid Other | Attending: Medical

## 2021-03-27 ENCOUNTER — Inpatient Hospital Stay (HOSPITAL_BASED_OUTPATIENT_CLINIC_OR_DEPARTMENT_OTHER): Payer: Medicaid Other | Admitting: Oncology

## 2021-03-27 VITALS — BP 129/68 | HR 86 | Temp 97.4°F | Resp 18 | Ht 65.0 in | Wt 165.1 lb

## 2021-03-27 DIAGNOSIS — Z5112 Encounter for antineoplastic immunotherapy: Secondary | ICD-10-CM | POA: Diagnosis present

## 2021-03-27 DIAGNOSIS — Z79899 Other long term (current) drug therapy: Secondary | ICD-10-CM | POA: Diagnosis not present

## 2021-03-27 DIAGNOSIS — C770 Secondary and unspecified malignant neoplasm of lymph nodes of head, face and neck: Secondary | ICD-10-CM

## 2021-03-27 DIAGNOSIS — C50312 Malignant neoplasm of lower-inner quadrant of left female breast: Secondary | ICD-10-CM | POA: Diagnosis not present

## 2021-03-27 DIAGNOSIS — C50119 Malignant neoplasm of central portion of unspecified female breast: Secondary | ICD-10-CM

## 2021-03-27 DIAGNOSIS — Z95828 Presence of other vascular implants and grafts: Secondary | ICD-10-CM

## 2021-03-27 DIAGNOSIS — D696 Thrombocytopenia, unspecified: Secondary | ICD-10-CM

## 2021-03-27 DIAGNOSIS — C7951 Secondary malignant neoplasm of bone: Secondary | ICD-10-CM | POA: Insufficient documentation

## 2021-03-27 LAB — COMPREHENSIVE METABOLIC PANEL
ALT: 14 U/L (ref 0–44)
AST: 25 U/L (ref 15–41)
Albumin: 3.8 g/dL (ref 3.5–5.0)
Alkaline Phosphatase: 69 U/L (ref 38–126)
Anion gap: 8 (ref 5–15)
BUN: 7 mg/dL (ref 6–20)
CO2: 28 mmol/L (ref 22–32)
Calcium: 9 mg/dL (ref 8.9–10.3)
Chloride: 104 mmol/L (ref 98–111)
Creatinine, Ser: 0.92 mg/dL (ref 0.44–1.00)
GFR, Estimated: >60 mL/min (ref >60)
Glucose, Bld: 97 mg/dL (ref 70–99)
Potassium: 4 mmol/L (ref 3.5–5.1)
Sodium: 140 mmol/L (ref 135–145)
Total Bilirubin: 0.3 mg/dL (ref 0.3–1.2)
Total Protein: 6.6 g/dL (ref 6.5–8.1)

## 2021-03-27 LAB — CBC WITH DIFFERENTIAL/PLATELET
Abs Immature Granulocytes: 0.02 10*3/uL (ref 0.00–0.07)
Basophils Absolute: 0 10*3/uL (ref 0.0–0.1)
Basophils Relative: 1 %
Eosinophils Absolute: 0 10*3/uL (ref 0.0–0.5)
Eosinophils Relative: 0 %
HCT: 32.3 % — ABNORMAL LOW (ref 36.0–46.0)
Hemoglobin: 10.5 g/dL — ABNORMAL LOW (ref 12.0–15.0)
Immature Granulocytes: 1 %
Lymphocytes Relative: 23 %
Lymphs Abs: 0.9 10*3/uL (ref 0.7–4.0)
MCH: 29.7 pg (ref 26.0–34.0)
MCHC: 32.5 g/dL (ref 30.0–36.0)
MCV: 91.2 fL (ref 80.0–100.0)
Monocytes Absolute: 0.3 10*3/uL (ref 0.1–1.0)
Monocytes Relative: 8 %
Neutro Abs: 2.6 10*3/uL (ref 1.7–7.7)
Neutrophils Relative %: 67 %
Platelets: 116 10*3/uL — ABNORMAL LOW (ref 150–400)
RBC: 3.54 MIL/uL — ABNORMAL LOW (ref 3.87–5.11)
RDW: 12.6 % (ref 11.5–15.5)
WBC: 3.8 10*3/uL — ABNORMAL LOW (ref 4.0–10.5)
nRBC: 0 % (ref 0.0–0.2)

## 2021-03-27 MED ORDER — HEPARIN SOD (PORK) LOCK FLUSH 100 UNIT/ML IV SOLN
500.0000 [IU] | Freq: Once | INTRAVENOUS | Status: AC | PRN
  Administered 2021-03-27: 500 [IU]
  Filled 2021-03-27: qty 5

## 2021-03-27 MED ORDER — ACETAMINOPHEN 325 MG PO TABS
650.0000 mg | ORAL_TABLET | Freq: Once | ORAL | Status: AC
  Administered 2021-03-27: 650 mg via ORAL

## 2021-03-27 MED ORDER — TRASTUZUMAB-DKST CHEMO 150 MG IV SOLR
450.0000 mg | Freq: Once | INTRAVENOUS | Status: AC
  Administered 2021-03-27: 450 mg via INTRAVENOUS
  Filled 2021-03-27: qty 21.43

## 2021-03-27 MED ORDER — VITAMIN D 25 MCG (1000 UNIT) PO TABS: 2000.0000 [IU] | ORAL_TABLET | Freq: Every day | ORAL | 4 refills | Status: AC

## 2021-03-27 MED ORDER — SODIUM CHLORIDE 0.9% FLUSH
10.0000 mL | INTRAVENOUS | Status: DC | PRN
  Administered 2021-03-27: 10 mL
  Filled 2021-03-27: qty 10

## 2021-03-27 MED ORDER — SODIUM CHLORIDE 0.9% FLUSH
10.0000 mL | INTRAVENOUS | Status: DC | PRN
Start: 2021-03-27 — End: 2021-03-27
  Administered 2021-03-27: 10 mL via INTRAVENOUS
  Filled 2021-03-27: qty 10

## 2021-03-27 MED ORDER — DOCUSATE SODIUM 100 MG PO CAPS: ORAL_CAPSULE | Freq: Every day | ORAL | 6 refills | Status: AC

## 2021-03-27 MED ORDER — SODIUM CHLORIDE 0.9 % IV SOLN
Freq: Once | INTRAVENOUS | Status: AC
  Filled 2021-03-27: qty 250

## 2021-03-27 MED ORDER — ACETAMINOPHEN 325 MG PO TABS
ORAL_TABLET | ORAL | Status: AC
  Filled 2021-03-27: qty 2

## 2021-03-27 NOTE — Patient Instructions (Signed)
[facility]  Discharge Instructions: ?Thank you for choosing [facility] to provide your oncology and hematology care.  ? ?If you have a lab appointment with the Cancer Center, please go directly to the Cancer Center and check in at the registration area. ?  ?Wear comfortable clothing and clothing appropriate for easy access to any Portacath or PICC line.  ? ?We strive to give you quality time with your provider. You may need to reschedule your appointment if you arrive late (15 or more minutes).  Arriving late affects you and other patients whose appointments are after yours.  Also, if you miss three or more appointments without notifying the office, you may be dismissed from the clinic at the provider?s discretion.    ?  ?For prescription refill requests, have your pharmacy contact our office and allow 72 hours for refills to be completed.   ? ?Today you received the following chemotherapy and/or immunotherapy agents: Trastuzumab    ?  ?To help prevent nausea and vomiting after your treatment, we encourage you to take your nausea medication as directed. ? ?BELOW ARE SYMPTOMS THAT SHOULD BE REPORTED IMMEDIATELY: ?*FEVER GREATER THAN 100.4 F (38 ?C) OR HIGHER ?*CHILLS OR SWEATING ?*NAUSEA AND VOMITING THAT IS NOT CONTROLLED WITH YOUR NAUSEA MEDICATION ?*UNUSUAL SHORTNESS OF BREATH ?*UNUSUAL BRUISING OR BLEEDING ?*URINARY PROBLEMS (pain or burning when urinating, or frequent urination) ?*BOWEL PROBLEMS (unusual diarrhea, constipation, pain near the anus) ?TENDERNESS IN MOUTH AND THROAT WITH OR WITHOUT PRESENCE OF ULCERS (sore throat, sores in mouth, or a toothache) ?UNUSUAL RASH, SWELLING OR PAIN  ?UNUSUAL VAGINAL DISCHARGE OR ITCHING  ? ?Items with * indicate a potential emergency and should be followed up as soon as possible or go to the Emergency Department if any problems should occur. ? ?Please show the CHEMOTHERAPY ALERT CARD or IMMUNOTHERAPY ALERT CARD at check-in  to the Emergency Department and triage nurse. ? ?Should you have questions after your visit or need to cancel or reschedule your appointment, please contact [facility]  Dept: [phone]  and follow the prompts.  Office hours are 8:00 a.m. to 4:30 p.m. Monday - Friday. Please note that voicemails left after 4:00 p.m. may not be returned until the following business day.  We are closed weekends and major holidays. You have access to a nurse at all times for urgent questions. Please call the main number to the clinic Dept: [phone] and follow the prompts. ? ? ?For any non-urgent questions, you may also contact your provider using MyChart. We now offer e-Visits for anyone 18 and older to request care online for non-urgent symptoms. For details visit [email or web address]. ?  ?Also download the MyChart app! Go to the app store, search "MyChart", open the app, select [facility], and log in with your MyChart username and password. ? ?Due to Covid, a mask is required upon entering the hospital/clinic. If you do not have a mask, one will be given to you upon arrival. For doctor visits, patients may have 1 support person aged 18 or older with them. For treatment visits, patients cannot have anyone with them due to current Covid guidelines and our immunocompromised population.  ? ?

## 2021-03-27 NOTE — Patient Instructions (Signed)
Implanted Port Insertion, Care After This sheet gives you information about how to care for yourself after your procedure. Your health care provider may also give you more specific instructions. If you have problems or questions, contact your health care provider. What can I expect after the procedure? After the procedure, it is common to have:  Discomfort at the port insertion site.  Bruising on the skin over the port. This should improve over 3-4 days. Follow these instructions at home: Port care  After your port is placed, you will get a manufacturer's information card. The card has information about your port. Keep this card with you at all times.  Take care of the port as told by your health care provider. Ask your health care provider if you or a family member can get training for taking care of the port at home. A home health care nurse may also take care of the port.  Make sure to remember what type of port you have. Incision care  Follow instructions from your health care provider about how to take care of your port insertion site. Make sure you: ? Wash your hands with soap and water before and after you change your bandage (dressing). If soap and water are not available, use hand sanitizer. ? Change your dressing as told by your health care provider. ? Leave stitches (sutures), skin glue, or adhesive strips in place. These skin closures may need to stay in place for 2 weeks or longer. If adhesive strip edges start to loosen and curl up, you may trim the loose edges. Do not remove adhesive strips completely unless your health care provider tells you to do that.  Check your port insertion site every day for signs of infection. Check for: ? Redness, swelling, or pain. ? Fluid or blood. ? Warmth. ? Pus or a bad smell.      Activity  Return to your normal activities as told by your health care provider. Ask your health care provider what activities are safe for you.  Do not  lift anything that is heavier than 10 lb (4.5 kg), or the limit that you are told, until your health care provider says that it is safe. General instructions  Take over-the-counter and prescription medicines only as told by your health care provider.  Do not take baths, swim, or use a hot tub until your health care provider approves. Ask your health care provider if you may take showers. You may only be allowed to take sponge baths.  Do not drive for 24 hours if you were given a sedative during your procedure.  Wear a medical alert bracelet in case of an emergency. This will tell any health care providers that you have a port.  Keep all follow-up visits as told by your health care provider. This is important. Contact a health care provider if:  You cannot flush your port with saline as directed, or you cannot draw blood from the port.  You have a fever or chills.  You have redness, swelling, or pain around your port insertion site.  You have fluid or blood coming from your port insertion site.  Your port insertion site feels warm to the touch.  You have pus or a bad smell coming from the port insertion site. Get help right away if:  You have chest pain or shortness of breath.  You have bleeding from your port that you cannot control. Summary  Take care of the port as told by your   health care provider. Keep the manufacturer's information card with you at all times.  Change your dressing as told by your health care provider.  Contact a health care provider if you have a fever or chills or if you have redness, swelling, or pain around your port insertion site.  Keep all follow-up visits as told by your health care provider. This information is not intended to replace advice given to you by your health care provider. Make sure you discuss any questions you have with your health care provider. Document Revised: 06/14/2018 Document Reviewed: 06/14/2018 Elsevier Patient Education   2021 Elsevier Inc.  

## 2021-04-07 ENCOUNTER — Ambulatory Visit (HOSPITAL_COMMUNITY)
Admission: EM | Admit: 2021-04-07 | Discharge: 2021-04-07 | Disposition: A | Discharge status: Home / Self Care | Payer: Medicaid Other | Attending: Emergency Medicine | Admitting: Emergency Medicine

## 2021-04-07 ENCOUNTER — Encounter (HOSPITAL_COMMUNITY): Payer: Self-pay

## 2021-04-07 ENCOUNTER — Other Ambulatory Visit: Payer: Self-pay

## 2021-04-07 ENCOUNTER — Ambulatory Visit: Payer: Self-pay | Admitting: *Deleted

## 2021-04-07 DIAGNOSIS — R350 Frequency of micturition: Secondary | ICD-10-CM

## 2021-04-07 DIAGNOSIS — R319 Hematuria, unspecified: Secondary | ICD-10-CM

## 2021-04-07 DIAGNOSIS — N390 Urinary tract infection, site not specified: Secondary | ICD-10-CM

## 2021-04-07 LAB — POCT URINALYSIS DIPSTICK, ED / UC
Bilirubin Urine: NEGATIVE
Glucose, UA: NEGATIVE mg/dL
Ketones, ur: NEGATIVE mg/dL
Nitrite: NEGATIVE
Protein, ur: NEGATIVE mg/dL
Specific Gravity, Urine: >=1.03 (ref 1.005–1.030)
Urobilinogen, UA: 0.2 mg/dL (ref 0.0–1.0)
pH: 5.5 (ref 5.0–8.0)

## 2021-04-07 MED ORDER — SULFAMETHOXAZOLE-TRIMETHOPRIM 800-160 MG PO TABS: 1.0000 | ORAL_TABLET | Freq: Two times a day (BID) | ORAL | 0 refills | Status: DC

## 2021-04-07 NOTE — Telephone Encounter (Signed)
Patient had Rx sent to pharmacy today.  Informed about the mobile unit operations hours and locations. Advised to call office to see if there any cancellations. Asking for an appt in 2 weeks.

## 2021-04-07 NOTE — ED Notes (Signed)
Pt still unable to provide urine sample at this time. Water provided.

## 2021-04-07 NOTE — ED Provider Notes (Signed)
[facility]    CSN: [id number] Arrival date & time: 04/07/21  1006      History   Chief Complaint Chief Complaint  Patient presents with  . Hematuria  . Urinary Frequency  . painful urination  . Gait Problem    HPI [name] is a 60 y.o. female.   Pt has blood in urine has frequency feels the pressure to void but not able to at times. States that she had loose bowels due to taking mira lax and believes that she may have a uti from that. Was able to give a very small amount of urine. Denies any n/v/d at this time. Has chronic back pain so no change in pain. Has not taken anything pta.      Past Medical History:  Diagnosis Date  . Alcohol abuse    none since 2013  . Anemia    during chemo  . Anxiety    At age 60  . Arthritis Dx 2010  . Bipolar disorder ([place])   . Bronchitis   . Cancer ([place])    breast mets to brain  . CHF (congestive heart failure) ([place])   . Chronic pain    resolved per patient 12/25/19  . Complication of anesthesia   . Depression   . Family history of adverse reaction to anesthesia    MOther had PONV  . GERD (gastroesophageal reflux disease)   . Headache    hx  migraines  . HLD (hyperlipidemia)   . Hypertension   . Lymphedema of left arm   . Opiate dependence ([place])   . PONV (postoperative nausea and vomiting)   . Port-A-Cath in place   . PTSD (post-traumatic stress disorder)   . S/P endometrial ablation    in md's office    Patient Active Problem List   Diagnosis Date Noted  . 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 Lymphedema of left upper extremity 12/09/2020  . Brain metastases ([place]) 11/01/2019  . Thrombocytopenia ([place]) 08/09/2019  . Port-A-Cath in place 06/20/2019  . 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 S/P mastectomy, left 12/09/2018  . S/P breast reconstruction, left 12/09/2018  . S/P radiation therapy 12/09/2018  . GERD (gastroesophageal reflux disease) 12/10/2017  .  Edema 11/09/2017  . Sensorineural hearing loss (SNHL) of both ears 11/05/2017  . Vitamin D deficiency 01/18/2017  . Bipolar I disorder, most recent episode depressed ([place]) 12/08/2016  . Adjustment disorder with anxiety 12/06/2016  . History of cancer metastatic to brain 07/27/2016  . Iron deficiency anemia 06/26/2016  . Bone metastases ([place]) 06/03/2016  . Primary cancer of lower-inner quadrant of left female breast ([place]) 06/01/2016  . Tobacco abuse 07/22/2015  . Alcoholism in remission ([place]) 07/22/2015  . Malignant neoplasm of female breast ([place]) 07/01/2015  . Current smoker 03/28/2015  . Goals of care, counseling/discussion 03/28/2015  . Seasonal allergies 03/28/2015  . Anxiety state 02/28/2015  . Fibromyalgia 02/28/2015  . Family history of diabetes mellitus 02/28/2015  . H/O alcohol abuse     Past Surgical History:  Procedure Laterality Date  . APPLICATION OF CRANIAL NAVIGATION N/A 08/14/2016   Procedure: APPLICATION OF CRANIAL NAVIGATION;  Surgeon: Erline Levine, MD;  Location: [facility];  Service: Neurosurgery;  Laterality: N/A;  . BREAST RECONSTRUCTION Left    with silicone implant  . COLONOSCOPY W/ POLYPECTOMY    . CRANIOTOMY N/A 08/14/2016   Procedure: CRANIOTOMY TUMOR EXCISION WITH Lucky Rathke;  Surgeon: Erline Levine, MD;  Location: [facility];  Service: Neurosurgery;  Laterality: N/A;  . FIBULA FRACTURE SURGERY Left   . MASTECTOMY Left   . RADIOLOGY WITH ANESTHESIA N/A 07/23/2016   Procedure: MRI OF BRAIN WITH AND WITHOUT;  Surgeon: Medication Radiologist, MD;  Location: [facility];  Service: Radiology;  Laterality: N/A;  . RADIOLOGY WITH ANESTHESIA N/A 09/08/2016   Procedure: MRI OF BRAIN WITH AND WITHOUT CONTRAST;  Surgeon: Medication Radiologist, MD;  Location: [facility];  Service: Radiology;  Laterality: N/A;  . RADIOLOGY WITH ANESTHESIA N/A 12/10/2016   Procedure: MRI OF BRAIN WITH AND WITHOUT;  Surgeon: Medication Radiologist, MD;  Location: [facility];  Service: Radiology;   Laterality: N/A;  . RADIOLOGY WITH ANESTHESIA N/A 03/02/2017   Procedure: MRI of BRAIN W and W/OUT CONTRAST;  Surgeon: Medication Radiologist, MD;  Location: [facility];  Service: Radiology;  Laterality: N/A;  . RADIOLOGY WITH ANESTHESIA N/A 07/29/2017   Procedure: RADIOLOGY WITH ANESTHESIA MRI OF BRAIN WITH AND WITHOUT CONTRAST;  Surgeon: Radiologist, Medication, MD;  Location: [facility];  Service: Radiology;  Laterality: N/A;  . RADIOLOGY WITH ANESTHESIA N/A 12/07/2017   Procedure: MRI WITH ANESTHESIA OF BRAIN WITH AND WITHOUT CONTRAST;  Surgeon: Radiologist, Medication, MD;  Location: [facility];  Service: Radiology;  Laterality: N/A;  . RADIOLOGY WITH ANESTHESIA N/A 04/07/2018   Procedure: MRI OF BRAIN WITH AND WITHOUT CONTRAST;  Surgeon: Radiologist, Medication, MD;  Location: [facility];  Service: Radiology;  Laterality: N/A;  . RADIOLOGY WITH ANESTHESIA N/A 08/23/2018   Procedure: MRI WITH ANESTHESIA OF THE BRAIN WITH AND WITHOUT;  Surgeon: Radiologist, Medication, MD;  Location: [facility];  Service: Radiology;  Laterality: N/A;  . RADIOLOGY WITH ANESTHESIA N/A 01/24/2019   Procedure: MRI OF BRAIN WITH AND WITHOUT CONTRAST;  Surgeon: Radiologist, Medication, MD;  Location: [facility];  Service: Radiology;  Laterality: N/A;  . RADIOLOGY WITH ANESTHESIA N/A 07/06/2019   Procedure: MRI WITH ANESTHESIA OF BRAIN WITH AND WITHOUT CONTRAST;  Surgeon: Radiologist, Medication, MD;  Location: [facility];  Service: Radiology;  Laterality: N/A;  . RADIOLOGY WITH ANESTHESIA N/A 01/11/2020   Procedure: MRI WITH ANESTHESIA BRAIN WITH AND WITHOUT;  Surgeon: Radiologist, Medication, MD;  Location: [facility];  Service: Radiology;  Laterality: N/A;  . RADIOLOGY WITH ANESTHESIA N/A 05/21/2020   Procedure: MRI WITH ANESTHESIA OF BRAIN WITH AND WITHOUT CONTRAST;  Surgeon: Radiologist, Medication, MD;  Location: [facility];  Service: Radiology;  Laterality: N/A;  . RADIOLOGY WITH ANESTHESIA N/A 01/02/2021   Procedure: MRI WITH ANESTHESIA  BRAIN WITH AND WITHOUT  CONTRAST,TOTAL SPINE MET SCREENING;  Surgeon: Radiologist, Medication, MD;  Location: [facility];  Service: Radiology;  Laterality: N/A;  . right power port placement Right     OB History   No obstetric history on file.      Home Medications    Prior to Admission medications   Medication Sig Start Date End Date Taking? Authorizing Provider  anastrozole (ARIMIDEX) 1 MG tablet TAKE 1 TABLET BY MOUTH ONCE A DAY 12/09/20 12/09/21 Yes Magrinat, Virgie Dad, MD  azelastine (ASTELIN) 0.1 % nasal spray Place 2 sprays into both nostrils 2 (two) times daily. Use in each nostril as directed Patient taking differently: Place 2 sprays into both nostrils daily. Use in each nostril as directed 03/28/20  Yes Causey, Charlestine Massed, NP  carvedilol (COREG) 3.125 MG tablet TAKE 1 TABLET (3.125 MG TOTAL) BY MOUTH 2 TIMES DAILY FOR 10 DAYS, THEN 2 TABLETS 2 TIMES DAILY. 10/14/20  Yes Larey Dresser, MD  cholecalciferol (VITAMIN D3) 25 MCG (1000 UNIT) tablet Take 2 tablets (2,000 Units total) by mouth daily. 03/27/21  Yes Magrinat, Virgie Dad, MD  cyclobenzaprine (FLEXERIL) 10 MG tablet Take 1 tablet (10 mg total) by mouth 2 (two) times daily. 02/27/21  Yes Charlott Rakes, MD  docusate sodium (COLACE) 100 MG capsule TAKE 1 CAPSULE BY MOUTH ONCE A DAY 03/27/21 03/27/22 Yes Magrinat, Virgie Dad, MD  fluticasone (FLONASE) 50 MCG/ACT nasal spray SPRAY 2 SPRAYS INTO EACH NOSTRIL EVERY DAY 02/23/21  Yes Newlin, Enobong, MD  ibuprofen (ADVIL) 800 MG tablet TAKE 1 TABLET BY MOUTH TWICE A DAY AS NEEDED FOR MODERATE PAIN. TAKE WITH FOOD. Patient taking differently: Take 800 mg by mouth in the morning and at bedtime. Take with food. 12/09/20 12/09/21 Yes Magrinat, Virgie Dad, MD  ketorolac (ACULAR) 0.5 % ophthalmic solution Place 1 drop into the left eye 4 (four) times daily. 12/13/20  Yes [provider]  lamoTRIgine (LAMICTAL) 100 MG tablet Take 100 mg by mouth every morning.   Yes [provider]  loratadine (CLARITIN) 10  MG tablet Take 1 tablet (10 mg total) by mouth daily. 06/18/20  Yes Causey, Charlestine Massed, NP  Lurasidone HCl 60 MG TABS Take 60 mg by mouth daily.   Yes [provider]  ondansetron (ZOFRAN) 8 MG tablet TAKE 1 TABLET (8 MG TOTAL) BY MOUTH EVERY 8 (EIGHT) HOURS AS NEEDED FOR NAUSEA OR VOMITING. 01/07/21  Yes Magrinat, Virgie Dad, MD  pantoprazole (PROTONIX) 40 MG tablet Take 1 tablet (40 mg total) by mouth daily. 06/18/20  Yes Causey, Charlestine Massed, NP  polyethylene glycol (MIRALAX / GLYCOLAX) 17 g packet Take 17 g by mouth daily as needed for mild constipation. Patient taking differently: Take 17 g by mouth every 3 (three) days. 12/09/20  Yes Magrinat, Virgie Dad, MD  rosuvastatin (CRESTOR) 10 MG tablet Take 1 tablet (10 mg total) by mouth daily. 07/10/20 10/08/20 Yes Larey Dresser, MD  sulfamethoxazole-trimethoprim (BACTRIM DS) 800-160 MG tablet Take 1 tablet by mouth 2 (two) times daily. 04/07/21  Yes Marney Setting, NP  traZODone (DESYREL) 100 MG tablet Take 1 tablet (100 mg total) by mouth at bedtime. Patient taking differently: Take 300 mg by mouth at bedtime. 04/30/17  Yes Florencia Reasons, MD    Family History Family History  Problem Relation Age of Onset  . Diabetes Mother   . Bipolar disorder Mother   . CAD Father     Social History Social History   Tobacco Use  . Smoking status: Former Smoker    Packs/day: 1.00    Years: 43.00    Pack years: 43.00    Types: Cigarettes  . Smokeless tobacco: Never Used  . Tobacco comment: 3 day since last cigarette  Vaping Use  . Vaping Use: Some days  Substance Use Topics  . Alcohol use: No    Comment: no ETOH since 08/22/12  . Drug use: No    Comment: states she's in recovery program for 7 years     Allergies   Demerol [meperidine hcl] and Erythromycin   Review of Systems Review of Systems  Constitutional: Positive for activity change.  Respiratory: Negative.   Cardiovascular: Negative.   Gastrointestinal: Positive for  abdominal pain.  Genitourinary: Positive for difficulty urinating, frequency, hematuria, pelvic pain and urgency.  Neurological: Negative.      Physical Exam Triage Vital Signs ED Triage Vitals  Enc Vitals Group     BP      Pulse  Resp      Temp      Temp src      SpO2      Weight      Height      Head Circumference      Peak Flow      Pain Score      Pain Loc      Pain Edu?      Excl. in [place]?    No data found.  Updated Vital Signs BP (!) 108/92   Pulse 98   Temp 98.8 F (37.1 C)   Resp 18   LMP  (LMP Unknown) Comment: ablation  SpO2 99%   Visual Acuity Right Eye Distance:   Left Eye Distance:   Bilateral Distance:    Right Eye Near:   Left Eye Near:    Bilateral Near:     Physical Exam Constitutional:      Appearance: Normal appearance.  Cardiovascular:     Rate and Rhythm: Normal rate.  Pulmonary:     Effort: Pulmonary effort is normal.  Abdominal:     Tenderness: There is abdominal tenderness. There is right CVA tenderness and left CVA tenderness.     Comments: Pt states cv tenderness is chronic   Skin:    General: Skin is warm.  Neurological:     Mental Status: She is alert.     Comments: Pt has mets ca to brain and other areas, alert has an unsteady gait able to walk steady to bathroom       UC Treatments / Results  Labs (all labs ordered are listed, but only abnormal results are displayed) Labs Reviewed  POCT URINALYSIS DIPSTICK, ED / UC - Abnormal; Notable for the following components:      Result Value   Hgb urine dipstick MODERATE (*)    Leukocytes,Ua LARGE (*)    All other components within normal limits  URINE CULTURE    EKG   Radiology No results found.  Procedures Procedures (including critical care time)  Medications Ordered in UC Medications - No data to display  Initial Impression / Assessment and Plan / UC Course  I have reviewed the triage vital signs and the nursing notes.  Pertinent labs & imaging  results that were available during my care of the patient were reviewed by me and considered in my medical decision making (see chart for details).     Will need to have urine rechecked after abx treatment  Take full dose of abx with food  Final Clinical Impressions(s) / UC Diagnoses   Final diagnoses:  Lower urinary tract infectious disease  Frequency of urination  Hematuria, unspecified type     Discharge Instructions     Will need to have urine rechecked after abx treatment  Take full dose of abx with food      ED Prescriptions    Medication Sig Dispense Auth. Provider   sulfamethoxazole-trimethoprim (BACTRIM DS) 800-160 MG tablet Take 1 tablet by mouth 2 (two) times daily. 10 tablet Marney Setting, NP     PDMP not reviewed this encounter.   Marney Setting, NP 04/07/21 [phone]

## 2021-04-07 NOTE — Telephone Encounter (Signed)
Pt called in c/o blood in urine and a lot of burning with urination this morning.   It just started this morning.   Denies flank pain on either side but is having abd cramping and frequency with urination.   She is requesting to be worked in today instead of going to the urgent care.    I let her know I would send a note to the office to see if she can be worked in.   She was agreeable to this.  I sent a high priority note to [facility] for a return call regarding scheduling an appt.   Reason for Disposition . [1] Pain or burning with passing urine AND [2] side (flank) or back pain present  Answer Assessment - Initial Assessment Questions 1. COLOR of URINE: "Describe the color of the urine."  (e.g., tea-colored, pink, red, blood clots, bloody)     I'm peeing blood and I'm burning with urination.  I'm so irritated down there. 2. ONSET: "When did the bleeding start?"      This morning.   Cramping   No flank pain. 3. EPISODES: "How many times has there been blood in the urine?" or "How many times today?"     Once 4. PAIN with URINATION: "Is there any pain with passing your urine?" If Yes, ask: "How bad is the pain?"  (Scale 1-10; or mild, moderate, severe)    - MILD - complains slightly about urination hurting    - MODERATE - interferes with normal activities      - SEVERE - excruciating, unwilling or unable to urinate because of the pain      Yes Moderate.  5. FEVER: "Do you have a fever?" If Yes, ask: "What is your temperature, how was it measured, and when did it start?"     No 6. ASSOCIATED SYMPTOMS: "Are you passing urine more frequently than usual?"     Yes 7. OTHER SYMPTOMS: "Do you have any other symptoms?" (e.g., back/flank pain, abdominal pain, vomiting)     Denies flank pain on either side.   Having abd cramping with urination. 8. PREGNANCY: "Is there any chance you are pregnant?" "When was your last menstrual period?"     Not asked due to  age  Protocols used: URINE - BLOOD IN-A-AH

## 2021-04-07 NOTE — Discharge Instructions (Addendum)
Will need to have urine rechecked after abx treatment  Take full dose of abx with food

## 2021-04-07 NOTE — ED Triage Notes (Signed)
Pt reports blood on tissue when wiping, urinary burning and frequency, sensation of bladder spasms starting this morning. Pt also reports feeling more wobbly today than she has been in the past (states can be unsteady at baseline due to some brain damage in the past. States gait is shuffling more today but that this comes and goes.

## 2021-04-09 ENCOUNTER — Other Ambulatory Visit: Payer: Self-pay | Admitting: *Deleted

## 2021-04-09 DIAGNOSIS — C50312 Malignant neoplasm of lower-inner quadrant of left female breast: Secondary | ICD-10-CM

## 2021-04-09 DIAGNOSIS — C7931 Secondary malignant neoplasm of brain: Secondary | ICD-10-CM

## 2021-04-10 ENCOUNTER — Other Ambulatory Visit: Payer: Self-pay | Admitting: *Deleted

## 2021-04-14 ENCOUNTER — Other Ambulatory Visit: Payer: Self-pay | Admitting: Oncology

## 2021-04-14 ENCOUNTER — Ambulatory Visit: Payer: Medicaid Other | Admitting: Physical Therapy

## 2021-04-14 DIAGNOSIS — R5381 Other malaise: Secondary | ICD-10-CM | POA: Insufficient documentation

## 2021-04-14 DIAGNOSIS — R2689 Other abnormalities of gait and mobility: Secondary | ICD-10-CM | POA: Insufficient documentation

## 2021-04-14 DIAGNOSIS — C770 Secondary and unspecified malignant neoplasm of lymph nodes of head, face and neck: Secondary | ICD-10-CM

## 2021-04-14 DIAGNOSIS — M259 Joint disorder, unspecified: Secondary | ICD-10-CM

## 2021-04-15 ENCOUNTER — Ambulatory Visit: Payer: Medicaid Other | Attending: Family Medicine | Admitting: Occupational Therapy

## 2021-04-15 ENCOUNTER — Encounter: Payer: Self-pay | Admitting: Occupational Therapy

## 2021-04-15 ENCOUNTER — Other Ambulatory Visit: Payer: Self-pay

## 2021-04-15 DIAGNOSIS — R41844 Frontal lobe and executive function deficit: Secondary | ICD-10-CM | POA: Diagnosis present

## 2021-04-15 DIAGNOSIS — R29810 Facial weakness: Secondary | ICD-10-CM | POA: Diagnosis not present

## 2021-04-15 DIAGNOSIS — R293 Abnormal posture: Secondary | ICD-10-CM | POA: Insufficient documentation

## 2021-04-15 DIAGNOSIS — R2681 Unsteadiness on feet: Secondary | ICD-10-CM

## 2021-04-15 DIAGNOSIS — M6281 Muscle weakness (generalized): Secondary | ICD-10-CM | POA: Insufficient documentation

## 2021-04-15 DIAGNOSIS — R2689 Other abnormalities of gait and mobility: Secondary | ICD-10-CM | POA: Insufficient documentation

## 2021-04-15 DIAGNOSIS — R41842 Visuospatial deficit: Secondary | ICD-10-CM | POA: Diagnosis present

## 2021-04-15 DIAGNOSIS — R41840 Attention and concentration deficit: Secondary | ICD-10-CM | POA: Insufficient documentation

## 2021-04-15 NOTE — Therapy (Signed)
[facility] [address] [place], [place], [address] Phone: [phone]   Fax:  [phone]  Occupational Therapy Evaluation  Patient Details  Name: [name] MRN: [id number] Date of Birth: [birth year] Referring Provider (OT): Dr. Virgie Dad. Magrinat   Encounter Date: 04/15/2021   OT End of Session - 04/15/21 1542    Visit Number 1    Number of Visits 4    Date for OT Re-Evaluation 05/15/21    Authorization Type Medicaid--awaiting auth    OT Start Time 0935    OT Stop Time 1015    OT Time Calculation (min) 40 min    Activity Tolerance Patient tolerated treatment well    Behavior During Therapy Impulsive           Past Medical History:  Diagnosis Date  . Alcohol abuse    none since 2013  . Anemia    during chemo  . Anxiety    At age 57  . Arthritis Dx 2010  . Bipolar disorder ([place])   . Bronchitis   . Cancer ([place])    breast mets to brain  . CHF (congestive heart failure) ([place])   . Chronic pain    resolved per patient 12/25/19  . Complication of anesthesia   . Depression   . Family history of adverse reaction to anesthesia    MOther had PONV  . GERD (gastroesophageal reflux disease)   . Headache    hx  migraines  . HLD (hyperlipidemia)   . Hypertension   . Lymphedema of left arm   . Opiate dependence ([place])   . PONV (postoperative nausea and vomiting)   . Port-A-Cath in place   . PTSD (post-traumatic stress disorder)   . S/P endometrial ablation    in md's office    Past Surgical History:  Procedure Laterality Date  . APPLICATION OF CRANIAL NAVIGATION N/A 08/14/2016   Procedure: APPLICATION OF CRANIAL NAVIGATION;  Surgeon: Erline Levine, MD;  Location: [facility];  Service: Neurosurgery;  Laterality: N/A;  . BREAST RECONSTRUCTION Left    with silicone implant  . COLONOSCOPY W/ POLYPECTOMY    . CRANIOTOMY N/A 08/14/2016   Procedure: CRANIOTOMY TUMOR EXCISION WITH Lucky Rathke;  Surgeon: Erline Levine, MD;  Location: [facility];  Service: Neurosurgery;  Laterality: N/A;  . FIBULA FRACTURE SURGERY Left   . MASTECTOMY Left   . RADIOLOGY WITH ANESTHESIA N/A 07/23/2016   Procedure: MRI OF BRAIN WITH AND WITHOUT;  Surgeon: Medication Radiologist, MD;  Location: [facility];  Service: Radiology;  Laterality: N/A;  . RADIOLOGY WITH ANESTHESIA N/A 09/08/2016   Procedure: MRI OF BRAIN WITH AND WITHOUT CONTRAST;  Surgeon: Medication Radiologist, MD;  Location: [facility];  Service: Radiology;  Laterality: N/A;  . RADIOLOGY WITH ANESTHESIA N/A 12/10/2016   Procedure: MRI OF BRAIN WITH AND WITHOUT;  Surgeon: Medication Radiologist, MD;  Location: [facility];  Service: Radiology;  Laterality: N/A;  . RADIOLOGY WITH ANESTHESIA N/A 03/02/2017   Procedure: MRI of BRAIN W and W/OUT CONTRAST;  Surgeon: Medication Radiologist, MD;  Location: [facility];  Service: Radiology;  Laterality: N/A;  . RADIOLOGY WITH ANESTHESIA N/A 07/29/2017   Procedure: RADIOLOGY WITH ANESTHESIA MRI OF BRAIN WITH AND WITHOUT CONTRAST;  Surgeon: Radiologist, Medication, MD;  Location: [facility];  Service: Radiology;  Laterality: N/A;  . RADIOLOGY WITH ANESTHESIA N/A 12/07/2017   Procedure: MRI WITH ANESTHESIA OF BRAIN WITH AND WITHOUT CONTRAST;  Surgeon: Radiologist, Medication, MD;  Location: [facility];  Service: Radiology;  Laterality: N/A;  . RADIOLOGY WITH ANESTHESIA N/A 04/07/2018   Procedure: MRI OF BRAIN WITH AND WITHOUT CONTRAST;  Surgeon: Radiologist, Medication, MD;  Location: [facility];  Service: Radiology;  Laterality: N/A;  . RADIOLOGY WITH ANESTHESIA N/A 08/23/2018   Procedure: MRI WITH ANESTHESIA OF THE BRAIN WITH AND WITHOUT;  Surgeon: Radiologist, Medication, MD;  Location: [facility];  Service: Radiology;  Laterality: N/A;  . RADIOLOGY WITH ANESTHESIA N/A 01/24/2019   Procedure: MRI OF BRAIN WITH AND WITHOUT CONTRAST;  Surgeon: Radiologist, Medication, MD;  Location: [facility];  Service: Radiology;  Laterality: N/A;  . RADIOLOGY WITH ANESTHESIA N/A 07/06/2019    Procedure: MRI WITH ANESTHESIA OF BRAIN WITH AND WITHOUT CONTRAST;  Surgeon: Radiologist, Medication, MD;  Location: [facility];  Service: Radiology;  Laterality: N/A;  . RADIOLOGY WITH ANESTHESIA N/A 01/11/2020   Procedure: MRI WITH ANESTHESIA BRAIN WITH AND WITHOUT;  Surgeon: Radiologist, Medication, MD;  Location: [facility];  Service: Radiology;  Laterality: N/A;  . RADIOLOGY WITH ANESTHESIA N/A 05/21/2020   Procedure: MRI WITH ANESTHESIA OF BRAIN WITH AND WITHOUT CONTRAST;  Surgeon: Radiologist, Medication, MD;  Location: [facility];  Service: Radiology;  Laterality: N/A;  . RADIOLOGY WITH ANESTHESIA N/A 01/02/2021   Procedure: MRI WITH ANESTHESIA  BRAIN WITH AND WITHOUT CONTRAST,TOTAL SPINE MET SCREENING;  Surgeon: Radiologist, Medication, MD;  Location: [facility];  Service: Radiology;  Laterality: N/A;  . right power port placement Right     There were no vitals filed for this visit.   Subjective Assessment - 04/15/21 0947    Subjective  Pt reports 5 falls last week.  Wasn't able to use rollator--pt arrived today with front R wheel not rolling (had odd unknown piece of black plastic stuck in wheel that therapist removed)    Pertinent History Deconditioning, decr balance, neoplasm of cervical lympth node, shoulder disorder.  PMH:  breast CA (05/2015) with L mastectomy and reconstruction, bony mets to R scapula, L iliac crest, L4 and T spine, R upper lung, liver, s/p suboccipital craniectomy for resection of posterior fossa metastasis, s/p radiation to the brain and breast; hx of alcohol abuse (none since 2013), anxiety, arthritis, hx of migraines, lymphedema of left arm, PTSD, HLD, HTN    Limitations fall risk; Lymphedema L arm    Patient Stated Goals to get stronger in my legs and be able to work    Currently in Pain? No/denies             [facility] OT Assessment - 04/15/21 0001      Assessment   Medical Diagnosis Deconditioning, decr balance, neoplasm of Cervical lympth node, shoulder disorder    Referring  Provider (OT) Dr. Virgie Dad. Magrinat    Onset Date/Surgical Date --   last week due to 5 falls (referred 04/14/21)   Hand Dominance Right    Prior Therapy PT 06/2021      Precautions   Precautions Fall    Precaution Comments lymphedema LUE, hx of breast CA      Balance Screen   Has the patient fallen in the past 6 months Yes    How many times? 5 in last week (with only mild scrapes/bruises), lands on L side   scheduled for PT evaluation Friday     Home  Environment   Family/patient expects to be discharged to: Private residence    Available Help at Discharge --   friend helps some   Home Access Stairs   4 steps   Lives  With Alone      Prior Function   Level of Independence --   mod I BADLs, gets some assist for IADLs, hx of falls     ADL   Lower Body Bathing --    Upper Body Dressing --    Tub/Shower Transfer Equipment --   Pt thinks that she has a seat (but boxed and hasn't been using).  tub/shower combo   Transfers/Ambulation Related to ADL's 2 falls getting in bed    ADL comments Pt reports performing BADLs mod I; however, hx of multiple falls in various situations.  Pt also reports that last week was a bad week and that one day it took her 64mn to put on underwear; however, pt unsure why..."it was just a bad week"      IADL   Shopping Needs to be accompanied on any shopping trip   walks behind cart, friend carries groceries   Prior Level of Function Light Housekeeping pt washes dishes    Prior Level of Function Meal Prep boyfriend cooks, pt cooks some in cKimtransportation (Art therapist or friend     Mobility   Mobility Status History of falls    Mobility Status Comments Pt reports that she typically ambulates with rollator but hasn't lately due to difficulty pushing (object stuck in wheel).  Pt with 5 falls in the last week.  Pt needed assistance for 4/5 falls to get up from floor (neighbors, boyfriend).  Pt reports occasional falls prior  to last week      Vision - History   Visual History Cataracts    Additional Comments Pt denies visual deficits      Vision Assessment   Saccades Additional eye shifts occurred during testing   nystagmus noted and decr speed     Cognition   Overall Cognitive Status Impaired/Different from baseline    Behaviors Impulsive;Perseveration    Cognition Comments slow processing, memory deficits, word finding difficulty, decr awareness/safety awareness, impulsive, decr attention.      Observation/Other Assessments   Skin Integrity Pt with small scrape L upper arm with dried blood.  Pt was picking at scab and reports that it was where she "got a blackhead this am".  Pt instructed not to pick at or scatch due to risk of infection.  Therapist cleaned with water and guaze and applied bandaid.      Sensation   Light Touch Appears Intact    Additional Comments Pt denies sensation changes      Coordination   Fine Motor Movements are Fluid and Coordinated No   rapid finger opposition with decr speed/accuracy     Edema   Edema Pt with obvious lymphedema LUE.  Pt reports that she has a night sleeve, but needs new compression sleeve.  Pt to discuss with MD next week.      ROM / Strength   AROM / PROM / Strength Strength;AROM      AROM   Overall AROM  Within functional limits for tasks performed    Overall AROM Comments BUEs ROM grossly [facility]     Strength   Overall Strength Deficits    Overall Strength Comments R proximal strength grossly 4+/5, L proximal strength grossly 3+ to 4/5.                           OT Education - 04/15/21 1552  Education Details OT POC; recommendation that pt does not pick scabs on LUE due to risk of infection; recommendation that pt follow up with MD for lymphedema, possible referral to social work, and about recieving some aide assistance    Person(s) Educated Patient    Methods Explanation    Comprehension Verbalized understanding                OT Long Term Goals - 04/15/21 1549      OT LONG TERM GOAL #1   Title Pt will verbalize understanding of AE/strategies for incr safety with ADLs and IADLs.    Baseline decr safty with ADLs/IADLs, hx of multiple falls (5 last week)    Time 4    Period Weeks    Status New      OT LONG TERM GOAL #2   Title Pt will verbalize understanding of appropriate community resources.    Baseline dependent, would benefit from community resources    Time 4    Period Weeks    Status New      OT LONG TERM GOAL #3   Title Pt will perform simple IADL task incorporating safety strategies mod I.    Baseline hx of multiple falls, cognitive deficits with decr safety    Time 4    Period Weeks    Status New                 Plan - 04/15/21 1543    Clinical Impression Statement Pt is a 60 y.o, female referred to occupational therapy for deconditioning, decr balance, neoplasm of cervical lympth node, shoulder disorder.  Pt with hx of  breast CA (05/2015) with L mastectomy and reconstruction, bony mets to R scapula, L iliac crest, L4 and T spine, R upper lung, liver, s/p suboccipital craniectomy for resection of posterior fossa metastasis, s/p radiation to the brain and breast.  Pt also with PMH that includes:  hx of alcohol abuse (none since 2013), anxiety, arthritis, hx of migraines, lymphedema of left arm, PTSD, HLD, HTN.  Pt presents today with hx of falls/decr balance and decr functional mobility for ADLs, decr strength, visual deficits, and cognitive deficits affecting safety.    OT Occupational Profile and History Detailed Assessment- Review of Records and additional review of physical, cognitive, psychosocial history related to current functional performance    Occupational performance deficits (Please refer to evaluation for details): ADL's;IADL's;Leisure;Social Participation    Body Structure / Function / Physical Skills ADL;Balance;Strength;IADL;Mobility;Coordination;Decreased knowledge  of precautions;Decreased knowledge of use of DME    Cognitive Skills Attention;Memory;Safety Awareness    Rehab Potential Fair    Clinical Decision Making Several treatment options, min-mod task modification necessary    Comorbidities Affecting Occupational Performance: May have comorbidities impacting occupational performance    Modification or Assistance to Complete Evaluation  Min-Moderate modification of tasks or assist with assess necessary to complete eval    OT Frequency 1x / week    OT Duration 4 weeks    OT Treatment/Interventions Self-care/ADL training;DME and/or AE instruction;Balance training;Therapeutic activities;Therapeutic exercise;Cognitive remediation/compensation;Functional Mobility Training;Neuromuscular education;Energy conservation;Patient/family education    Plan AE and strategies for incr safety with ADLs/IADLs    Consulted and Agree with Plan of Care Patient           Patient will benefit from skilled therapeutic intervention in order to improve the following deficits and impairments:   Body Structure / Function / Physical Skills: ADL,Balance,Strength,IADL,Mobility,Coordination,Decreased knowledge of precautions,Decreased knowledge of use of DME Cognitive Skills: Attention,Memory,Safety  Awareness     Visit Diagnosis: Unsteadiness on feet  Frontal lobe and executive function deficit  Attention and concentration deficit  Visuospatial deficit  Muscle weakness (generalized)    Problem List Patient Active Problem List   Diagnosis Date Noted  . Physical deconditioning 04/14/2021  . Balance disorder 04/14/2021  . Shoulder disorder 04/14/2021  . 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 Lymphedema of left upper extremity 12/09/2020  . Brain metastases ([place]) 11/01/2019  . Thrombocytopenia ([place]) 08/09/2019  . Port-A-Cath in place 06/20/2019  . 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 S/P mastectomy, left 12/09/2018  .  S/P breast reconstruction, left 12/09/2018  . S/P radiation therapy 12/09/2018  . GERD (gastroesophageal reflux disease) 12/10/2017  . Edema 11/09/2017  . Sensorineural hearing loss (SNHL) of both ears 11/05/2017  . Vitamin D deficiency 01/18/2017  . Bipolar I disorder, most recent episode depressed ([place]) 12/08/2016  . Adjustment disorder with anxiety 12/06/2016  . History of cancer metastatic to brain 07/27/2016  . Iron deficiency anemia 06/26/2016  . Bone metastases ([place]) 06/03/2016  . Primary cancer of lower-inner quadrant of left female breast ([place]) 06/01/2016  . Tobacco abuse 07/22/2015  . Alcoholism in remission ([place]) 07/22/2015  . Malignant neoplasm of female breast ([place]) 07/01/2015  . Current smoker 03/28/2015  . Goals of care, counseling/discussion 03/28/2015  . Seasonal allergies 03/28/2015  . Anxiety state 02/28/2015  . Fibromyalgia 02/28/2015  . Family history of diabetes mellitus 02/28/2015  . H/O alcohol abuse     [facility] 04/15/2021, 3:59 PM  [facility] [address] [place], [place], [address] Phone: [phone]   Fax:  [phone]  Name: [name] MRN: [id number] Date of Birth: September 01, 1961   Vianne Bulls, OTR/L [facility] [address] [place], [place]  [address] [phone] phone [phone] 04/15/21 3:59 PM

## 2021-04-15 NOTE — Progress Notes (Signed)
Provider called patient on 04/16/2021 at 8:28 AM for telemedicine visit. Patient reports that she intended to cancel today's appointment because she would rather follow-up at her PCP's office located at [facility]. Reports she plans to call today to schedule an appointment.

## 2021-04-16 ENCOUNTER — Encounter: Payer: Medicaid Other | Admitting: Family

## 2021-04-16 ENCOUNTER — Other Ambulatory Visit: Payer: Self-pay | Admitting: *Deleted

## 2021-04-16 DIAGNOSIS — C50312 Malignant neoplasm of lower-inner quadrant of left female breast: Secondary | ICD-10-CM

## 2021-04-16 DIAGNOSIS — Z09 Encounter for follow-up examination after completed treatment for conditions other than malignant neoplasm: Secondary | ICD-10-CM

## 2021-04-16 DIAGNOSIS — R319 Hematuria, unspecified: Secondary | ICD-10-CM

## 2021-04-16 DIAGNOSIS — R350 Frequency of micturition: Secondary | ICD-10-CM

## 2021-04-16 DIAGNOSIS — N390 Urinary tract infection, site not specified: Secondary | ICD-10-CM

## 2021-04-17 ENCOUNTER — Inpatient Hospital Stay: Payer: Medicaid Other

## 2021-04-17 ENCOUNTER — Other Ambulatory Visit: Payer: Medicaid Other

## 2021-04-17 ENCOUNTER — Ambulatory Visit: Payer: Medicaid Other | Admitting: Oncology

## 2021-04-17 ENCOUNTER — Telehealth: Payer: Self-pay | Admitting: Oncology

## 2021-04-17 NOTE — Telephone Encounter (Signed)
R/s appt pt call in from 5/18 requesting to r/s. Called pt, no answer. Left msg with updated appt date and time as well as location.

## 2021-04-18 ENCOUNTER — Other Ambulatory Visit: Payer: Self-pay

## 2021-04-18 ENCOUNTER — Inpatient Hospital Stay: Payer: Medicaid Other

## 2021-04-18 ENCOUNTER — Encounter: Payer: Self-pay | Admitting: Physical Therapy

## 2021-04-18 ENCOUNTER — Ambulatory Visit: Payer: Medicaid Other | Admitting: Physical Therapy

## 2021-04-18 DIAGNOSIS — R2689 Other abnormalities of gait and mobility: Secondary | ICD-10-CM

## 2021-04-18 DIAGNOSIS — R2681 Unsteadiness on feet: Secondary | ICD-10-CM

## 2021-04-18 DIAGNOSIS — M6281 Muscle weakness (generalized): Secondary | ICD-10-CM

## 2021-04-18 DIAGNOSIS — R293 Abnormal posture: Secondary | ICD-10-CM

## 2021-04-18 NOTE — Therapy (Signed)
[facility] [address] [place], [place], [address] Phone: [phone]   Fax:  [phone]  Physical Therapy Evaluation  Patient Details  Name: [name] MRN: [id number] Date of Birth: [birth year] Referring Provider (PT): Magrinat, Sarajane Jews   Encounter Date: 04/18/2021   PT End of Session - 04/18/21 0747    Visit Number 1    Number of Visits 17    Authorization Type Medicaid-requested auth at completion of eval    PT Start Time 0850    PT Stop Time 0924    PT Time Calculation (min) 34 min    Equipment Utilized During Treatment Gait belt    Activity Tolerance Patient tolerated treatment well;Patient limited by fatigue   fatigued with increased shuffling gait by end of session   Behavior During Therapy [facility] for tasks assessed/performed;Impulsive           Past Medical History:  Diagnosis Date  . Alcohol abuse    none since 2013  . Anemia    during chemo  . Anxiety    At age 27  . Arthritis Dx 2010  . Bipolar disorder ([place])   . Bronchitis   . Cancer ([place])    breast mets to brain  . CHF (congestive heart failure) ([place])   . Chronic pain    resolved per patient 12/25/19  . Complication of anesthesia   . Depression   . Family history of adverse reaction to anesthesia    MOther had PONV  . GERD (gastroesophageal reflux disease)   . Headache    hx  migraines  . HLD (hyperlipidemia)   . Hypertension   . Lymphedema of left arm   . Opiate dependence ([place])   . PONV (postoperative nausea and vomiting)   . Port-A-Cath in place   . PTSD (post-traumatic stress disorder)   . S/P endometrial ablation    in md's office    Past Surgical History:  Procedure Laterality Date  . APPLICATION OF CRANIAL NAVIGATION N/A 08/14/2016   Procedure: APPLICATION OF CRANIAL NAVIGATION;  Surgeon: Erline Levine, MD;  Location: [facility];  Service: Neurosurgery;  Laterality: N/A;  . BREAST RECONSTRUCTION Left    with silicone  implant  . COLONOSCOPY W/ POLYPECTOMY    . CRANIOTOMY N/A 08/14/2016   Procedure: CRANIOTOMY TUMOR EXCISION WITH Lucky Rathke;  Surgeon: Erline Levine, MD;  Location: [facility];  Service: Neurosurgery;  Laterality: N/A;  . FIBULA FRACTURE SURGERY Left   . MASTECTOMY Left   . RADIOLOGY WITH ANESTHESIA N/A 07/23/2016   Procedure: MRI OF BRAIN WITH AND WITHOUT;  Surgeon: Medication Radiologist, MD;  Location: [facility];  Service: Radiology;  Laterality: N/A;  . RADIOLOGY WITH ANESTHESIA N/A 09/08/2016   Procedure: MRI OF BRAIN WITH AND WITHOUT CONTRAST;  Surgeon: Medication Radiologist, MD;  Location: [facility];  Service: Radiology;  Laterality: N/A;  . RADIOLOGY WITH ANESTHESIA N/A 12/10/2016   Procedure: MRI OF BRAIN WITH AND WITHOUT;  Surgeon: Medication Radiologist, MD;  Location: [facility];  Service: Radiology;  Laterality: N/A;  . RADIOLOGY WITH ANESTHESIA N/A 03/02/2017   Procedure: MRI of BRAIN W and W/OUT CONTRAST;  Surgeon: Medication Radiologist, MD;  Location: [facility];  Service: Radiology;  Laterality: N/A;  . RADIOLOGY WITH ANESTHESIA N/A 07/29/2017   Procedure: RADIOLOGY WITH ANESTHESIA MRI OF BRAIN WITH AND WITHOUT CONTRAST;  Surgeon: Radiologist, Medication, MD;  Location: [facility];  Service: Radiology;  Laterality: N/A;  . RADIOLOGY WITH ANESTHESIA N/A 12/07/2017   Procedure:  MRI WITH ANESTHESIA OF BRAIN WITH AND WITHOUT CONTRAST;  Surgeon: Radiologist, Medication, MD;  Location: [facility];  Service: Radiology;  Laterality: N/A;  . RADIOLOGY WITH ANESTHESIA N/A 04/07/2018   Procedure: MRI OF BRAIN WITH AND WITHOUT CONTRAST;  Surgeon: Radiologist, Medication, MD;  Location: [facility];  Service: Radiology;  Laterality: N/A;  . RADIOLOGY WITH ANESTHESIA N/A 08/23/2018   Procedure: MRI WITH ANESTHESIA OF THE BRAIN WITH AND WITHOUT;  Surgeon: Radiologist, Medication, MD;  Location: [facility];  Service: Radiology;  Laterality: N/A;  . RADIOLOGY WITH ANESTHESIA N/A 01/24/2019   Procedure: MRI OF BRAIN WITH AND WITHOUT CONTRAST;   Surgeon: Radiologist, Medication, MD;  Location: [facility];  Service: Radiology;  Laterality: N/A;  . RADIOLOGY WITH ANESTHESIA N/A 07/06/2019   Procedure: MRI WITH ANESTHESIA OF BRAIN WITH AND WITHOUT CONTRAST;  Surgeon: Radiologist, Medication, MD;  Location: [facility];  Service: Radiology;  Laterality: N/A;  . RADIOLOGY WITH ANESTHESIA N/A 01/11/2020   Procedure: MRI WITH ANESTHESIA BRAIN WITH AND WITHOUT;  Surgeon: Radiologist, Medication, MD;  Location: [facility];  Service: Radiology;  Laterality: N/A;  . RADIOLOGY WITH ANESTHESIA N/A 05/21/2020   Procedure: MRI WITH ANESTHESIA OF BRAIN WITH AND WITHOUT CONTRAST;  Surgeon: Radiologist, Medication, MD;  Location: [facility];  Service: Radiology;  Laterality: N/A;  . RADIOLOGY WITH ANESTHESIA N/A 01/02/2021   Procedure: MRI WITH ANESTHESIA  BRAIN WITH AND WITHOUT CONTRAST,TOTAL SPINE MET SCREENING;  Surgeon: Radiologist, Medication, MD;  Location: [facility];  Service: Radiology;  Laterality: N/A;  . right power port placement Right     There were no vitals filed for this visit.    Subjective Assessment - 04/18/21 0746    Subjective Have some weakness, which makes it hard to get dressed and walk.  Some weeks are better than others.  Have a rollator, but just use that when it is "bad".  Forgot to bring it today.  Had 5 falls last week, but no falls this week.    Pertinent History PMH: breast cancer with mets to braine/lymph node, alcohol abuse (none since 2013), anemia (during chemo), anxiety, arthritis, bipoloar, bronchitis, headaches (hx of migraines), lymphedema of left arm, PTSD    Limitations Sitting;Standing;Walking    Patient Stated Goals Pt's goals for PT are to get back on my feet more steady.    Currently in Pain? No/denies              [facility] PT Assessment - 04/18/21 0854      Assessment   Medical Diagnosis Deconditioning, decreaesd balance    Referring Provider (PT) Magrinat, Sarajane Jews    Onset Date/Surgical Date 04/14/21   MD order   Hand Dominance  Right    Prior Therapy PT 06/2021      Precautions   Precautions Fall    Precaution Comments lymphedema LUE, hx of breast CA      Balance Screen   Has the patient fallen in the past 6 months Yes    How many times? multiple-I don't know how many    Has the patient had a decrease in activity level because of a fear of falling?  Yes    Is the patient reluctant to leave their home because of a fear of falling?  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    Leisure Enjoys being out in the community      Observation/Other Assessments   Focus on Therapeutic Outcomes (FOTO)  NA      Posture/Postural Control   Posture/Postural Control Postural limitations    Postural Limitations Rounded Shoulders;Forward head      ROM / Strength   AROM / PROM / Strength AROM;Strength      AROM   Overall AROM  Within functional limits for tasks performed    Overall AROM Comments BLEs WFL      Strength   Overall Strength Deficits    Strength Assessment Site Ankle;Hip;Knee    Right/Left Hip Right;Left    Right Hip Flexion 4/5    Left Hip Flexion 3+/5    Right/Left Knee Right;Left    Right Knee Flexion 4/5    Right Knee Extension 4/5    Left Knee Flexion 3+/5    Left Knee Extension 3+/5    Right/Left Ankle Right;Left    Right Ankle Dorsiflexion 4/5    Left Ankle Dorsiflexion 4/5      Transfers   Transfers Sit to Stand;Stand to Sit    Sit to Stand 5: Supervision;Without upper extremity assist;From chair/3-in-1    Five time sit to stand comments   20.44    Stand to Sit 5: Supervision;Without upper extremity assist;To chair/3-in-1      Ambulation/Gait   Ambulation/Gait Yes    Ambulation/Gait Assistance 5: Supervision    Ambulation/Gait Assistance Details did not bring rollator today    Ambulation Distance (Feet) 80 Feet    Assistive device None    Gait Pattern Step-through pattern;Decreased arm swing - right;Decreased arm swing - left;Decreased step length - right;Decreased step length - left;Poor foot clearance - left;Narrow base of support;Shuffle;Poor foot clearance - right   Shuffling noted with fatigue, slight ataxia in trunk   Ambulation Surface Level;Indoor    Gait velocity 15.88 sec = 2.07 ft/sec      Standardized Balance Assessment   Standardized Balance Assessment Timed Up and Go Test;Dynamic Gait Index      Dynamic Gait Index   Level Surface Moderate Impairment    Change in Gait Speed Moderate Impairment    Gait with Horizontal Head Turns Moderate Impairment    Gait with Vertical Head Turns Moderate Impairment    Gait and Pivot Turn Severe Impairment    Step Over Obstacle Severe Impairment    Step Around Obstacles Moderate Impairment    Steps Severe Impairment   Able to go alternating pattern, but with min guard, decreased foot clearance.   Total Score 5    DGI comment: Scores <19/24 indicate increased fall risk       Timed Up and Go Test   Normal TUG (seconds) 19.19    TUG Comments Scores >13.5 sec indicates increased fall risk.                      Objective measurements completed on examination: See above findings.               PT Education - 04/18/21 0919    Education Details PT eval results,  PT POC    Person(s) Educated Patient    Methods Explanation    Comprehension Verbalized understanding            PT Short Term Goals - 04/18/21 1319      PT SHORT TERM GOAL #1   Title Pt will be independent with HEP for strength, gait, and balance.  TARGET 05/23/2021    Baseline not  currently performing HEP-pt lost previous HEP packet    Time 4    Period Weeks    Status New      PT SHORT TERM GOAL #2   Title Pt will decrease 5 x sit to stand from 20.44 sec to <15 sec for improved balance and functional strength.    Baseline 20.44 sec no UE support    Time 4    Period Weeks    Status New      PT SHORT TERM GOAL #3   Title Pt will improve TUG score to less than or equal to 15 seconds for decreased fall risk.    Baseline TUG 19.19 sec    Time 4    Period Weeks    Status New      PT SHORT TERM GOAL #4   Title Pt will verbalize understanding of fall prevention education.    Baseline Pt at fall risk per TUG and DGI scores    Time 4    Period Weeks    Status New      PT SHORT TERM GOAL #5   Title Pt will improve DGI score to at least 9/24 for imrpoved balance without device in short distances.    Baseline 5/24    Time 4    Period Weeks    Status New             PT Long Term Goals - 04/18/21 1323      PT LONG TERM GOAL #1   Title Pt will be independent with progression of HEP for improved balance, strength, gait.  TARGET 06/20/21    Baseline not currently performing an HEP    Time 8    Period Weeks    Status New      PT LONG TERM GOAL #2   Title Pt will improve gait velocity to at least 2.62 ft/sec for improved gait efficiency and safety.    Baseline 2.07 ft/sec at eval (no device)    Time 8    Period Weeks    Status New      PT LONG TERM GOAL #3   Title Pt will improve TUG score to less than or equal to 13.5 sec for decreased fall risk.    Baseline 19.19 sec    Time 8    Period Weeks    Status New      PT LONG TERM GOAL #4   Title Pt will ambulate up/down 4 steps with bilateral rails supervision for improved safety with household access.    Baseline supervision/min guard, decreased foot clearance.    Time 8    Period Weeks    Status New      PT LONG TERM GOAL #5   Title Pt will ambulate at least 1000 ft indoor and outdoor surfaces,  using rollator, mod I, for improved community gait.    Baseline supervision/min guard with no device short distance gait    Time 8    Period Weeks    Status New  Plan - 04/18/21 0747    Clinical Impression Statement Pt is a 60 y.o, female referred to occupational therapy for deconditioning, decr balance, neoplasm of cervical lympth node, shoulder disorder.  Pt with hx of  breast CA (05/2015) with L mastectomy and reconstruction, bony mets to R scapula, L iliac crest, L4 and T spine, R upper lung, liver, s/p suboccipital craniectomy for resection of posterior fossa metastasis, s/p radiation to the brain and breast.  Pt also with PMH that includes:  hx of alcohol abuse (none since 2013), anxiety, arthritis, hx of migraines, lymphedema of left arm, PTSD, HLD, HTN.  She has infusion treatments every three weeks.  She presents to OPPT with decreased functional strength in lower extremities, decreased trunk strength, decreased balance, decreased safety awareness, decreased independence and safety with gait, decreased activity tolerance.  She has history of multiple falls (5 falls last week), and is at fall risk per DGI and TUG scores.  Educated pt based on DGI scores that she needs to be using rollator when walking longer distances.  She would benefit from skilled PT to address the above stated deficits to decrease fall risk and improve overall functional mobility.    Personal Factors and Comorbidities Comorbidity 3+;Behavior Pattern    Comorbidities See above    Examination-Activity Limitations Locomotion Level;Transfers;Stairs;Stand    Examination-Participation Restrictions Occupation;Community Activity;Shop    Stability/Clinical Decision Making Evolving/Moderate complexity    Clinical Decision Making Moderate    Rehab Potential Good    PT Frequency 2x / week    PT Duration 8 weeks   plus eval   PT Treatment/Interventions DME Instruction;Neuromuscular re-education;Balance  training;Therapeutic exercise;Therapeutic activities;Functional mobility training;Stair training;Gait training;Patient/family education;Orthotic Fit/Training;Passive range of motion;Manual techniques    PT Next Visit Plan Initiate HEP for strengthening, balance, gait and stair training, fall prevention education    Consulted and Agree with Plan of Care Patient           Patient will benefit from skilled therapeutic intervention in order to improve the following deficits and impairments:  Abnormal gait,Difficulty walking,Decreased balance,Decreased mobility,Decreased strength  Visit Diagnosis: Other abnormalities of gait and mobility  Muscle weakness (generalized)  Unsteadiness on feet  Abnormal posture     Problem List Patient Active Problem List   Diagnosis Date Noted  . Physical deconditioning 04/14/2021  . Balance disorder 04/14/2021  . Shoulder disorder 04/14/2021  . 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 Lymphedema of left upper extremity 12/09/2020  . Brain metastases ([place]) 11/01/2019  . Thrombocytopenia ([place]) 08/09/2019  . Port-A-Cath in place 06/20/2019  . 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 S/P mastectomy, left 12/09/2018  . S/P breast reconstruction, left 12/09/2018  . S/P radiation therapy 12/09/2018  . GERD (gastroesophageal reflux disease) 12/10/2017  . Edema 11/09/2017  . Sensorineural hearing loss (SNHL) of both ears 11/05/2017  . Vitamin D deficiency 01/18/2017  . Bipolar I disorder, most recent episode depressed ([place]) 12/08/2016  . Adjustment disorder with anxiety 12/06/2016  . History of cancer metastatic to brain 07/27/2016  . Iron deficiency anemia 06/26/2016  . Bone metastases ([place]) 06/03/2016  . Primary cancer of lower-inner quadrant of left female breast ([place]) 06/01/2016  . Tobacco abuse 07/22/2015  . Alcoholism in remission ([place]) 07/22/2015  . Malignant neoplasm of female breast ([place]) 07/01/2015  .  Current smoker 03/28/2015  . Goals of care, counseling/discussion 03/28/2015  . Seasonal allergies 03/28/2015  . Anxiety state 02/28/2015  . Fibromyalgia 02/28/2015  . Family history  of diabetes mellitus 02/28/2015  . H/O alcohol abuse     Shannon Class W. 04/18/2021, 1:27 PM Shannon Butt., PT  [facility] [address] [place], [place], [address] Phone: [phone]   Fax:  [phone]  Name: [name] MRN: [id number] Date of Birth: 02/22/1961

## 2021-04-24 ENCOUNTER — Encounter: Payer: Self-pay | Admitting: Oncology

## 2021-04-25 ENCOUNTER — Inpatient Hospital Stay: Payer: Medicaid Other

## 2021-04-25 ENCOUNTER — Inpatient Hospital Stay: Payer: Medicaid Other | Attending: Medical

## 2021-04-25 ENCOUNTER — Other Ambulatory Visit: Payer: Self-pay

## 2021-04-25 ENCOUNTER — Encounter: Payer: Self-pay | Admitting: Rehabilitation

## 2021-04-25 ENCOUNTER — Ambulatory Visit: Payer: Medicaid Other | Attending: Oncology | Admitting: Rehabilitation

## 2021-04-25 VITALS — BP 123/73 | HR 94 | Temp 98.1°F | Resp 18 | Wt 157.5 lb

## 2021-04-25 DIAGNOSIS — I972 Postmastectomy lymphedema syndrome: Secondary | ICD-10-CM | POA: Insufficient documentation

## 2021-04-25 DIAGNOSIS — Z79899 Other long term (current) drug therapy: Secondary | ICD-10-CM | POA: Insufficient documentation

## 2021-04-25 DIAGNOSIS — C7951 Secondary malignant neoplasm of bone: Secondary | ICD-10-CM | POA: Insufficient documentation

## 2021-04-25 DIAGNOSIS — C770 Secondary and unspecified malignant neoplasm of lymph nodes of head, face and neck: Secondary | ICD-10-CM

## 2021-04-25 DIAGNOSIS — D696 Thrombocytopenia, unspecified: Secondary | ICD-10-CM

## 2021-04-25 DIAGNOSIS — C50312 Malignant neoplasm of lower-inner quadrant of left female breast: Secondary | ICD-10-CM | POA: Diagnosis present

## 2021-04-25 DIAGNOSIS — Z95828 Presence of other vascular implants and grafts: Secondary | ICD-10-CM

## 2021-04-25 DIAGNOSIS — C50119 Malignant neoplasm of central portion of unspecified female breast: Secondary | ICD-10-CM

## 2021-04-25 DIAGNOSIS — Z5112 Encounter for antineoplastic immunotherapy: Secondary | ICD-10-CM | POA: Diagnosis present

## 2021-04-25 LAB — CMP (CANCER CENTER ONLY)
ALT: 16 U/L (ref 0–44)
AST: 27 U/L (ref 15–41)
Albumin: 4 g/dL (ref 3.5–5.0)
Alkaline Phosphatase: 73 U/L (ref 38–126)
Anion gap: 9 (ref 5–15)
BUN: 14 mg/dL (ref 6–20)
CO2: 28 mmol/L (ref 22–32)
Calcium: 9.7 mg/dL (ref 8.9–10.3)
Chloride: 98 mmol/L (ref 98–111)
Creatinine: 1.02 mg/dL — ABNORMAL HIGH (ref 0.44–1.00)
GFR, Estimated: >60 mL/min (ref >60)
Glucose, Bld: 91 mg/dL (ref 70–99)
Potassium: 4.1 mmol/L (ref 3.5–5.1)
Sodium: 135 mmol/L (ref 135–145)
Total Bilirubin: 0.2 mg/dL — ABNORMAL LOW (ref 0.3–1.2)
Total Protein: 6.8 g/dL (ref 6.5–8.1)

## 2021-04-25 LAB — CBC WITH DIFFERENTIAL (CANCER CENTER ONLY)
Abs Immature Granulocytes: 0.01 10*3/uL (ref 0.00–0.07)
Basophils Absolute: 0 10*3/uL (ref 0.0–0.1)
Basophils Relative: 0 %
Eosinophils Absolute: 0 10*3/uL (ref 0.0–0.5)
Eosinophils Relative: 0 %
HCT: 32.6 % — ABNORMAL LOW (ref 36.0–46.0)
Hemoglobin: 10.8 g/dL — ABNORMAL LOW (ref 12.0–15.0)
Immature Granulocytes: 0 %
Lymphocytes Relative: 19 %
Lymphs Abs: 1.1 10*3/uL (ref 0.7–4.0)
MCH: 29.6 pg (ref 26.0–34.0)
MCHC: 33.1 g/dL (ref 30.0–36.0)
MCV: 89.3 fL (ref 80.0–100.0)
Monocytes Absolute: 0.4 10*3/uL (ref 0.1–1.0)
Monocytes Relative: 7 %
Neutro Abs: 4.1 10*3/uL (ref 1.7–7.7)
Neutrophils Relative %: 74 %
Platelet Count: 114 10*3/uL — ABNORMAL LOW (ref 150–400)
RBC: 3.65 MIL/uL — ABNORMAL LOW (ref 3.87–5.11)
RDW: 12.9 % (ref 11.5–15.5)
WBC Count: 5.6 10*3/uL (ref 4.0–10.5)
nRBC: 0 % (ref 0.0–0.2)

## 2021-04-25 MED ORDER — SODIUM CHLORIDE 0.9 % IV SOLN
Freq: Once | INTRAVENOUS | Status: AC
  Filled 2021-04-25: qty 250

## 2021-04-25 MED ORDER — TRASTUZUMAB-DKST CHEMO 150 MG IV SOLR
6.0000 mg/kg | Freq: Once | INTRAVENOUS | Status: AC
  Administered 2021-04-25: 399 mg via INTRAVENOUS
  Filled 2021-04-25: qty 19

## 2021-04-25 MED ORDER — SODIUM CHLORIDE 0.9% FLUSH
10.0000 mL | INTRAVENOUS | Status: DC | PRN
  Administered 2021-04-25: 10 mL
  Filled 2021-04-25: qty 10

## 2021-04-25 MED ORDER — HEPARIN SOD (PORK) LOCK FLUSH 100 UNIT/ML IV SOLN
500.0000 [IU] | Freq: Once | INTRAVENOUS | Status: AC | PRN
Start: 2021-04-25 — End: 2021-04-25
  Administered 2021-04-25: 500 [IU]
  Filled 2021-04-25: qty 5

## 2021-04-25 MED ORDER — SODIUM CHLORIDE 0.9% FLUSH
10.0000 mL | INTRAVENOUS | Status: AC | PRN
  Administered 2021-04-25: 10 mL
  Filled 2021-04-25: qty 10

## 2021-04-25 MED ORDER — ACETAMINOPHEN 325 MG PO TABS
ORAL_TABLET | ORAL | Status: AC
  Filled 2021-04-25: qty 2

## 2021-04-25 MED ORDER — ACETAMINOPHEN 325 MG PO TABS
650.0000 mg | ORAL_TABLET | Freq: Once | ORAL | Status: AC
Start: 2021-04-25 — End: 2021-04-25
  Administered 2021-04-25: 650 mg via ORAL

## 2021-04-25 NOTE — Therapy (Signed)
Pt arrived to lymphedema eval but is already being seen by Neurorehab and has been approved by medicaid so discussed with patient insurance requirements of one location for PT only and pt will complete neurorehab first due to frequent falls.  Size of the arm was measured which is up around 2cm compared to almost 1 year ago.  Ptisnot wearing anycompression so she will go get a circaid reduction kit and new night sleeve to wear.

## 2021-04-25 NOTE — Patient Instructions (Signed)
[facility] to get a circaid reduction kit with glove as well as new tribute night garment and glove and will let us know to get it fitted or have them fit it to wear during neurorehab and to get a headstart on lymphedema treatment.

## 2021-04-25 NOTE — Patient Instructions (Signed)
[facility]   Discharge Instructions: Thank you for choosing [facility] to provide your oncology and hematology care.   If you have a lab appointment with the Cancer Center, please go directly to the Cancer Center and check in at the registration area.   Wear comfortable clothing and clothing appropriate for easy access to any Portacath or PICC line.   We strive to give you quality time with your provider. You may need to reschedule your appointment if you arrive late (15 or more minutes).  Arriving late affects you and other patients whose appointments are after yours.  Also, if you miss three or more appointments without notifying the office, you may be dismissed from the clinic at the provider's discretion.      For prescription refill requests, have your pharmacy contact our office and allow 72 hours for refills to be completed.    Today you received the following chemotherapy and/or immunotherapy agents: [id number].      To help prevent nausea and vomiting after your treatment, we encourage you to take your nausea medication as directed.  BELOW ARE SYMPTOMS THAT SHOULD BE REPORTED IMMEDIATELY: *FEVER GREATER THAN 100.4 F (38 C) OR HIGHER *CHILLS OR SWEATING *NAUSEA AND VOMITING THAT IS NOT CONTROLLED WITH YOUR NAUSEA MEDICATION *UNUSUAL SHORTNESS OF BREATH *UNUSUAL BRUISING OR BLEEDING *URINARY PROBLEMS (pain or burning when urinating, or frequent urination) *BOWEL PROBLEMS (unusual diarrhea, constipation, pain near the anus) TENDERNESS IN MOUTH AND THROAT WITH OR WITHOUT PRESENCE OF ULCERS (sore throat, sores in mouth, or a toothache) UNUSUAL RASH, SWELLING OR PAIN  UNUSUAL VAGINAL DISCHARGE OR ITCHING   Items with * indicate a potential emergency and should be followed up as soon as possible or go to the Emergency Department if any problems should occur.  Please show the CHEMOTHERAPY ALERT CARD or IMMUNOTHERAPY ALERT CARD at  check-in to the Emergency Department and triage nurse.  Should you have questions after your visit or need to cancel or reschedule your appointment, please contact [facility]  Dept: [phone]  and follow the prompts.  Office hours are 8:00 a.m. to 4:30 p.m. Monday - Friday. Please note that voicemails left after 4:00 p.m. may not be returned until the following business day.  We are closed weekends and major holidays. You have access to a nurse at all times for urgent questions. Please call the main number to the clinic Dept: [phone] and follow the prompts.   For any non-urgent questions, you may also contact your provider using MyChart. We now offer e-Visits for anyone 18 and older to request care online for non-urgent symptoms. For details visit [email or web address].   Also download the MyChart app! Go to the app store, search "MyChart", open the app, select [facility], and log in with your MyChart username and password.  Due to Covid, a mask is required upon entering the hospital/clinic. If you do not have a mask, one will be given to you upon arrival. For doctor visits, patients may have 1 support person aged 18 or older with them. For treatment visits, patients cannot have anyone with them due to current Covid guidelines and our immunocompromised population.    

## 2021-04-30 ENCOUNTER — Encounter: Payer: Self-pay | Admitting: Physical Therapy

## 2021-04-30 ENCOUNTER — Other Ambulatory Visit: Payer: Self-pay

## 2021-04-30 ENCOUNTER — Ambulatory Visit: Payer: Medicaid Other | Admitting: Occupational Therapy

## 2021-04-30 ENCOUNTER — Ambulatory Visit: Payer: Medicaid Other | Admitting: Physical Therapy

## 2021-04-30 ENCOUNTER — Encounter: Payer: Self-pay | Admitting: Occupational Therapy

## 2021-04-30 VITALS — Wt 157.4 lb

## 2021-04-30 DIAGNOSIS — R2689 Other abnormalities of gait and mobility: Secondary | ICD-10-CM

## 2021-04-30 DIAGNOSIS — R293 Abnormal posture: Secondary | ICD-10-CM | POA: Insufficient documentation

## 2021-04-30 DIAGNOSIS — R41842 Visuospatial deficit: Secondary | ICD-10-CM | POA: Insufficient documentation

## 2021-04-30 DIAGNOSIS — M6281 Muscle weakness (generalized): Secondary | ICD-10-CM

## 2021-04-30 DIAGNOSIS — R41844 Frontal lobe and executive function deficit: Secondary | ICD-10-CM

## 2021-04-30 DIAGNOSIS — R41840 Attention and concentration deficit: Secondary | ICD-10-CM

## 2021-04-30 DIAGNOSIS — C7951 Secondary malignant neoplasm of bone: Secondary | ICD-10-CM | POA: Diagnosis present

## 2021-04-30 DIAGNOSIS — Z5112 Encounter for antineoplastic immunotherapy: Secondary | ICD-10-CM | POA: Diagnosis present

## 2021-04-30 DIAGNOSIS — Z79899 Other long term (current) drug therapy: Secondary | ICD-10-CM | POA: Diagnosis not present

## 2021-04-30 DIAGNOSIS — C50312 Malignant neoplasm of lower-inner quadrant of left female breast: Secondary | ICD-10-CM | POA: Diagnosis present

## 2021-04-30 DIAGNOSIS — R2681 Unsteadiness on feet: Secondary | ICD-10-CM

## 2021-04-30 NOTE — Patient Instructions (Signed)

## 2021-04-30 NOTE — Therapy (Signed)
[facility] [address] [place], [place], [address] Phone: [phone]   Fax:  [phone]  Occupational Therapy Treatment  Patient Details  Name: [name] MRN: [id number] Date of Birth: 21-Apr-1961 Referring Provider (OT): Dr. Virgie Dad. Magrinat   Encounter Date: 04/30/2021   OT End of Session - 04/30/21 1019    Visit Number 2    Number of Visits 4    Date for OT Re-Evaluation 05/15/21    Authorization Type Medicaid--awaiting auth    OT Start Time 1016    OT Stop Time 1055    OT Time Calculation (min) 39 min    Activity Tolerance Patient tolerated treatment well    Behavior During Therapy Impulsive           Past Medical History:  Diagnosis Date  . Alcohol abuse    none since 2013  . Anemia    during chemo  . Anxiety    At age 71  . Arthritis Dx 2010  . Bipolar disorder ([place])   . Bronchitis   . Cancer ([facility])    breast mets to brain  . CHF (congestive heart failure) ([place])   . Chronic pain    resolved per patient 12/25/19  . Complication of anesthesia   . Depression   . Family history of adverse reaction to anesthesia    MOther had PONV  . GERD (gastroesophageal reflux disease)   . Headache    hx  migraines  . HLD (hyperlipidemia)   . Hypertension   . Lymphedema of left arm   . Opiate dependence ([place])   . PONV (postoperative nausea and vomiting)   . Port-A-Cath in place   . PTSD (post-traumatic stress disorder)   . S/P endometrial ablation    in md's office    Past Surgical History:  Procedure Laterality Date  . APPLICATION OF CRANIAL NAVIGATION N/A 08/14/2016   Procedure: APPLICATION OF CRANIAL NAVIGATION;  Surgeon: Erline Levine, MD;  Location: [facility];  Service: Neurosurgery;  Laterality: N/A;  . BREAST RECONSTRUCTION Left    with silicone implant  . COLONOSCOPY W/ POLYPECTOMY    . CRANIOTOMY N/A 08/14/2016   Procedure: CRANIOTOMY TUMOR EXCISION WITH [place];  Surgeon: Erline Levine, MD;  Location: [facility];  Service: Neurosurgery;  Laterality: N/A;  . FIBULA FRACTURE SURGERY Left   . MASTECTOMY Left   . RADIOLOGY WITH ANESTHESIA N/A 07/23/2016   Procedure: MRI OF BRAIN WITH AND WITHOUT;  Surgeon: Medication Radiologist, MD;  Location: [facility];  Service: Radiology;  Laterality: N/A;  . RADIOLOGY WITH ANESTHESIA N/A 09/08/2016   Procedure: MRI OF BRAIN WITH AND WITHOUT CONTRAST;  Surgeon: Medication Radiologist, MD;  Location: [facility];  Service: Radiology;  Laterality: N/A;  . RADIOLOGY WITH ANESTHESIA N/A 12/10/2016   Procedure: MRI OF BRAIN WITH AND WITHOUT;  Surgeon: Medication Radiologist, MD;  Location: [facility];  Service: Radiology;  Laterality: N/A;  . RADIOLOGY WITH ANESTHESIA N/A 03/02/2017   Procedure: MRI of BRAIN W and W/OUT CONTRAST;  Surgeon: Medication Radiologist, MD;  Location: [facility];  Service: Radiology;  Laterality: N/A;  . RADIOLOGY WITH ANESTHESIA N/A 07/29/2017   Procedure: RADIOLOGY WITH ANESTHESIA MRI OF BRAIN WITH AND WITHOUT CONTRAST;  Surgeon: Radiologist, Medication, MD;  Location: [facility];  Service: Radiology;  Laterality: N/A;  . RADIOLOGY WITH ANESTHESIA N/A 12/07/2017   Procedure: MRI WITH ANESTHESIA OF BRAIN WITH AND WITHOUT CONTRAST;  Surgeon: Radiologist, Medication, MD;  Location: [facility];  Service: Radiology;  Laterality: N/A;  . RADIOLOGY WITH ANESTHESIA N/A 04/07/2018   Procedure: MRI OF BRAIN WITH AND WITHOUT CONTRAST;  Surgeon: Radiologist, Medication, MD;  Location: [facility];  Service: Radiology;  Laterality: N/A;  . RADIOLOGY WITH ANESTHESIA N/A 08/23/2018   Procedure: MRI WITH ANESTHESIA OF THE BRAIN WITH AND WITHOUT;  Surgeon: Radiologist, Medication, MD;  Location: [facility];  Service: Radiology;  Laterality: N/A;  . RADIOLOGY WITH ANESTHESIA N/A 01/24/2019   Procedure: MRI OF BRAIN WITH AND WITHOUT CONTRAST;  Surgeon: Radiologist, Medication, MD;  Location: [facility];  Service: Radiology;  Laterality: N/A;  . RADIOLOGY WITH ANESTHESIA N/A 07/06/2019    Procedure: MRI WITH ANESTHESIA OF BRAIN WITH AND WITHOUT CONTRAST;  Surgeon: Radiologist, Medication, MD;  Location: [facility];  Service: Radiology;  Laterality: N/A;  . RADIOLOGY WITH ANESTHESIA N/A 01/11/2020   Procedure: MRI WITH ANESTHESIA BRAIN WITH AND WITHOUT;  Surgeon: Radiologist, Medication, MD;  Location: [facility];  Service: Radiology;  Laterality: N/A;  . RADIOLOGY WITH ANESTHESIA N/A 05/21/2020   Procedure: MRI WITH ANESTHESIA OF BRAIN WITH AND WITHOUT CONTRAST;  Surgeon: Radiologist, Medication, MD;  Location: [facility];  Service: Radiology;  Laterality: N/A;  . RADIOLOGY WITH ANESTHESIA N/A 01/02/2021   Procedure: MRI WITH ANESTHESIA  BRAIN WITH AND WITHOUT CONTRAST,TOTAL SPINE MET SCREENING;  Surgeon: Radiologist, Medication, MD;  Location: [facility];  Service: Radiology;  Laterality: N/A;  . right power port placement Right     Vitals:   04/30/21 1052  Weight: 157 lb 6.4 oz (71.4 kg)     Subjective Assessment - 04/30/21 1020    Subjective  Pt denies any pain.    Pertinent History Deconditioning, decr balance, neoplasm of cervical lympth node, shoulder disorder.  PMH:  breast CA (05/2015) with L mastectomy and reconstruction, bony mets to R scapula, L iliac crest, L4 and T spine, R upper lung, liver, s/p suboccipital craniectomy for resection of posterior fossa metastasis, s/p radiation to the brain and breast; hx of alcohol abuse (none since 2013), anxiety, arthritis, hx of migraines, lymphedema of left arm, PTSD, HLD, HTN    Limitations fall risk; Lymphedema L arm    Patient Stated Goals to get stronger in my legs and be able to work             Pt reports boyfriend cooks and completing meals and is pleased with this. Pt reported taking laundry to [employer] and a friend helps with completing it and is pleased with this plan and procedure.   Pt reports having an interview soon for getting an aide to come in and help with showers and some self-care at home.   Went over energy conservation  techniques and strategies. Pt receptive and verbalized understanding.                      OT Education - 04/30/21 1101    Education Details Energy conservation strategies    Person(s) Educated Patient    Methods Explanation    Comprehension Verbalized understanding               OT Long Term Goals - 04/30/21 1021      OT LONG TERM GOAL #1   Title Pt will verbalize understanding of AE/strategies for incr safety with ADLs and IADLs.    Baseline decr safty with ADLs/IADLs, hx of multiple falls (5 last week)    Time 4    Period Weeks    Status On-going  OT LONG TERM GOAL #2   Title Pt will verbalize understanding of appropriate community resources.    Baseline dependent, would benefit from community resources    Time 4    Period Weeks    Status On-going   pt is in the process of getting an aid     OT LONG TERM GOAL #3   Title Pt will perform simple IADL task incorporating safety strategies mod I.    Baseline hx of multiple falls, cognitive deficits with decr safety    Time 4    Period Weeks    Status On-going   pt reports getting a friend to help with laundry and changing cat litter but otherwise is completing light housekeeping and pet care with mod I                Plan - 04/30/21 1406    Clinical Impression Statement Pt is agreeable to goals. Pt receptive to energy conservation strategies.    OT Occupational Profile and History Detailed Assessment- Review of Records and additional review of physical, cognitive, psychosocial history related to current functional performance    Occupational performance deficits (Please refer to evaluation for details): ADL's;IADL's;Leisure;Social Participation    Body Structure / Function / Physical Skills ADL;Balance;Strength;IADL;Mobility;Coordination;Decreased knowledge of precautions;Decreased knowledge of use of DME    Cognitive Skills Attention;Memory;Safety Awareness    Rehab Potential Fair    Clinical  Decision Making Several treatment options, min-mod task modification necessary    Comorbidities Affecting Occupational Performance: May have comorbidities impacting occupational performance    Modification or Assistance to Complete Evaluation  Min-Moderate modification of tasks or assist with assess necessary to complete eval    OT Frequency 1x / week    OT Duration 4 weeks    OT Treatment/Interventions Self-care/ADL training;DME and/or AE instruction;Balance training;Therapeutic activities;Therapeutic exercise;Cognitive remediation/compensation;Functional Mobility Training;Neuromuscular education;Energy conservation;Patient/family education    Plan AE and strategies for incr safety with ADLs/IADLs    Consulted and Agree with Plan of Care Patient           Patient will benefit from skilled therapeutic intervention in order to improve the following deficits and impairments:   Body Structure / Function / Physical Skills: ADL,Balance,Strength,IADL,Mobility,Coordination,Decreased knowledge of precautions,Decreased knowledge of use of DME Cognitive Skills: Attention,Memory,Safety Awareness     Visit Diagnosis: Visuospatial deficit  Other abnormalities of gait and mobility  Muscle weakness (generalized)  Unsteadiness on feet  Frontal lobe and executive function deficit  Attention and concentration deficit    Problem List Patient Active Problem List   Diagnosis Date Noted  . Physical deconditioning 04/14/2021  . Balance disorder 04/14/2021  . Shoulder disorder 04/14/2021  . 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 Lymphedema of left upper extremity 12/09/2020  . Brain metastases ([facility] [facility]) 11/01/2019  . Thrombocytopenia ([place]) 08/09/2019  . Port-A-Cath in place 06/20/2019  . 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[facility]) 03/30/2019  . S/P mastectomy, left 12/09/2018  . S/P breast reconstruction, left 12/09/2018  . S/P radiation therapy 12/09/2018  . GERD  (gastroesophageal reflux disease) 12/10/2017  . Edema 11/09/2017  . Sensorineural hearing loss (SNHL) of both ears 11/05/2017  . Vitamin D deficiency 01/18/2017  . Bipolar I disorder, most recent episode depressed ([facility]) 12/08/2016  . Adjustment disorder with anxiety 12/06/2016  . History of cancer metastatic to brain 07/27/2016  . Iron deficiency anemia 06/26/2016  . Bone metastases ([facility]) 06/03/2016  . Primary cancer of lower-inner quadrant of left female breast ([facility]) 06/01/2016  .  Tobacco abuse 07/22/2015  . Alcoholism in remission ([facility]) 07/22/2015  . Malignant neoplasm of female breast ([facility]) 07/01/2015  . Current smoker 03/28/2015  . Goals of care, counseling/discussion 03/28/2015  . Seasonal allergies 03/28/2015  . Anxiety state 02/28/2015  . Fibromyalgia 02/28/2015  . Family history of diabetes mellitus 02/28/2015  . H/O alcohol abuse     Zachery Conch Luther, OTR/L  04/30/2021, 2:11 PM  [facility] [address] [place], [place], [address] Phone: [phone]   Fax:  [phone]  Name: [name] MRN: [id number] Date of Birth: [birth year]

## 2021-04-30 NOTE — Patient Instructions (Signed)
Access Code: [email or web address] URL: [email or web address] Date: 04/30/2021 Prepared by: Willow Ora  Exercises Sit to Stand - 1 x daily - 5 x weekly - 1 sets - 10 reps Seated March with Resistance - 1 x daily - 5 x weekly - 1 sets - 10 reps Seated Hip Abduction with Resistance - 1 x daily - 5 x weekly - 1 sets - 10 reps - 3 hold Heel Toe Raises with Counter Support - 1 x daily - 5 x weekly - 1 sets - 10 reps Standing Hip Abduction - 1 x daily - 5 x weekly - 1 sets - 10 reps Narrow Stance with Counter Support - 1 x daily - 5 x weekly - 1 sets - 3 reps - 20 hold Standing Single Leg Stance with Counter Support - 1 x daily - 5 x weekly - 1 sets - 3 reps - 10 hold

## 2021-05-01 NOTE — Therapy (Signed)
[facility] [address] [place], [place], [address] Phone: [phone]   Fax:  [phone]  Physical Therapy Treatment  Patient Details  Name: [name] MRN: [id number] Date of Birth: Mar 28, 1961 Referring Provider (PT): Magrinat, Sarajane Jews   Encounter Date: 04/30/2021   PT End of Session - 04/30/21 0938    Visit Number 2    Number of Visits 17    Authorization Type Medicaid    Authorization Time Period 12 visits from 04/30/21-06/10/21    Authorization - Visit Number 1    Authorization - Number of Visits 12    PT Start Time 0933    PT Stop Time 1015    PT Time Calculation (min) 42 min    Equipment Utilized During Treatment Gait belt    Activity Tolerance Patient tolerated treatment well;Patient limited by fatigue    Behavior During Therapy [facility] for tasks assessed/performed;Impulsive           Past Medical History:  Diagnosis Date  . Alcohol abuse    none since 2013  . Anemia    during chemo  . Anxiety    At age 51  . Arthritis Dx 2010  . Bipolar disorder ([place])   . Bronchitis   . Cancer ([place])    breast mets to brain  . CHF (congestive heart failure) ([place])   . Chronic pain    resolved per patient 12/25/19  . Complication of anesthesia   . Depression   . Family history of adverse reaction to anesthesia    MOther had PONV  . GERD (gastroesophageal reflux disease)   . Headache    hx  migraines  . HLD (hyperlipidemia)   . Hypertension   . Lymphedema of left arm   . Opiate dependence ([place])   . PONV (postoperative nausea and vomiting)   . Port-A-Cath in place   . PTSD (post-traumatic stress disorder)   . S/P endometrial ablation    in md's office    Past Surgical History:  Procedure Laterality Date  . APPLICATION OF CRANIAL NAVIGATION N/A 08/14/2016   Procedure: APPLICATION OF CRANIAL NAVIGATION;  Surgeon: Erline Levine, MD;  Location: [facility];  Service: Neurosurgery;  Laterality: N/A;  . BREAST  RECONSTRUCTION Left    with silicone implant  . COLONOSCOPY W/ POLYPECTOMY    . CRANIOTOMY N/A 08/14/2016   Procedure: CRANIOTOMY TUMOR EXCISION WITH Lucky Rathke;  Surgeon: Erline Levine, MD;  Location: [place] [facility];  Service: Neurosurgery;  Laterality: N/A;  . FIBULA FRACTURE SURGERY Left   . MASTECTOMY Left   . RADIOLOGY WITH ANESTHESIA N/A 07/23/2016   Procedure: MRI OF BRAIN WITH AND WITHOUT;  Surgeon: Medication Radiologist, MD;  Location: [facility];  Service: Radiology;  Laterality: N/A;  . RADIOLOGY WITH ANESTHESIA N/A 09/08/2016   Procedure: MRI OF BRAIN WITH AND WITHOUT CONTRAST;  Surgeon: Medication Radiologist, MD;  Location: [facility];  Service: Radiology;  Laterality: N/A;  . RADIOLOGY WITH ANESTHESIA N/A 12/10/2016   Procedure: MRI OF BRAIN WITH AND WITHOUT;  Surgeon: Medication Radiologist, MD;  Location: [facility];  Service: Radiology;  Laterality: N/A;  . RADIOLOGY WITH ANESTHESIA N/A 03/02/2017   Procedure: MRI of BRAIN W and W/OUT CONTRAST;  Surgeon: Medication Radiologist, MD;  Location: [facility];  Service: Radiology;  Laterality: N/A;  . RADIOLOGY WITH ANESTHESIA N/A 07/29/2017   Procedure: RADIOLOGY WITH ANESTHESIA MRI OF BRAIN WITH AND WITHOUT CONTRAST;  Surgeon: Radiologist, Medication, MD;  Location: [facility];  Service: Radiology;  Laterality: N/A;  . RADIOLOGY WITH ANESTHESIA N/A 12/07/2017   Procedure: MRI WITH ANESTHESIA OF BRAIN WITH AND WITHOUT CONTRAST;  Surgeon: Radiologist, Medication, MD;  Location: [facility];  Service: Radiology;  Laterality: N/A;  . RADIOLOGY WITH ANESTHESIA N/A 04/07/2018   Procedure: MRI OF BRAIN WITH AND WITHOUT CONTRAST;  Surgeon: Radiologist, Medication, MD;  Location: [facility];  Service: Radiology;  Laterality: N/A;  . RADIOLOGY WITH ANESTHESIA N/A 08/23/2018   Procedure: MRI WITH ANESTHESIA OF THE BRAIN WITH AND WITHOUT;  Surgeon: Radiologist, Medication, MD;  Location: [facility];  Service: Radiology;  Laterality: N/A;  . RADIOLOGY WITH ANESTHESIA N/A 01/24/2019   Procedure:  MRI OF BRAIN WITH AND WITHOUT CONTRAST;  Surgeon: Radiologist, Medication, MD;  Location: [facility];  Service: Radiology;  Laterality: N/A;  . RADIOLOGY WITH ANESTHESIA N/A 07/06/2019   Procedure: MRI WITH ANESTHESIA OF BRAIN WITH AND WITHOUT CONTRAST;  Surgeon: Radiologist, Medication, MD;  Location: [facility];  Service: Radiology;  Laterality: N/A;  . RADIOLOGY WITH ANESTHESIA N/A 01/11/2020   Procedure: MRI WITH ANESTHESIA BRAIN WITH AND WITHOUT;  Surgeon: Radiologist, Medication, MD;  Location: [facility];  Service: Radiology;  Laterality: N/A;  . RADIOLOGY WITH ANESTHESIA N/A 05/21/2020   Procedure: MRI WITH ANESTHESIA OF BRAIN WITH AND WITHOUT CONTRAST;  Surgeon: Radiologist, Medication, MD;  Location: [facility];  Service: Radiology;  Laterality: N/A;  . RADIOLOGY WITH ANESTHESIA N/A 01/02/2021   Procedure: MRI WITH ANESTHESIA  BRAIN WITH AND WITHOUT CONTRAST,TOTAL SPINE MET SCREENING;  Surgeon: Radiologist, Medication, MD;  Location: [facility];  Service: Radiology;  Laterality: N/A;  . right power port placement Right     There were no vitals filed for this visit.   Subjective Assessment - 04/30/21 0936    Subjective Has not had a change to go and get her new sleeve for the left arm. Hoping to go tomorrow. Left arm with mild to moderate lymphedema. No falls since Neuro PT eval. Has her rollator with her today.    Pertinent History PMH: breast cancer with mets to brain/lymph node, alcohol abuse (none since 2013), anemia (during chemo), anxiety, arthritis, bipoloar, bronchitis, headaches (hx of migraines), lymphedema of left arm, PTSD    Limitations Sitting;Standing;Walking    Patient Stated Goals Pt's goals for PT are to get back on my feet more steady.    Currently in Pain? No/denies    Pain Score 0-No pain                 [facility] Adult PT Treatment/Exercise - 04/30/21 0939      Transfers   Transfers Sit to Stand;Stand to Sit    Sit to Stand 5: Supervision;Without upper extremity assist;From chair/3-in-1     Stand to Sit 5: Supervision;Without upper extremity assist;To chair/3-in-1      Ambulation/Gait   Ambulation/Gait Yes    Ambulation/Gait Assistance 5: Supervision    Ambulation/Gait Assistance Details around clinic with session    Assistive device Rollator    Gait Pattern Step-through pattern;Decreased arm swing - right;Decreased arm swing - left;Decreased step length - right;Decreased step length - left;Poor foot clearance - left;Narrow base of support;Shuffle;Poor foot clearance - right    Ambulation Surface Level;Indoor      Exercises   Exercises Other Exercises    Other Exercises  reviewed and revised pt's HEP from previous episode of care. Added in balance ex's as well. Refer to [facility] for full dteaisl      Knee/Hip Exercises: Aerobic   Nustep LE/UE's level  3.0 x 8 minutes with goal >/= 40 steps per minute for strengthening and activity tolerance.          issued the following to HEP today: Access Code: [email or web address] URL: [email or web address] Date: 04/30/2021 Prepared by: Willow Ora  Exercises Sit to Stand - 1 x daily - 5 x weekly - 1 sets - 10 reps Seated March with Resistance - 1 x daily - 5 x weekly - 1 sets - 10 reps Seated Hip Abduction with Resistance - 1 x daily - 5 x weekly - 1 sets - 10 reps - 3 hold Heel Toe Raises with Counter Support - 1 x daily - 5 x weekly - 1 sets - 10 reps Standing Hip Abduction - 1 x daily - 5 x weekly - 1 sets - 10 reps Narrow Stance with Counter Support - 1 x daily - 5 x weekly - 1 sets - 3 reps - 20 hold Standing Single Leg Stance with Counter Support - 1 x daily - 5 x weekly - 1 sets - 3 reps - 10 hold         PT Education - 04/30/21 1155    Education Details HEP issued today    Person(s) Educated Patient    Methods Explanation;Demonstration;Tactile cues;Verbal cues;Handout    Comprehension Verbalized understanding;Returned demonstration;Verbal cues required;Tactile cues required;Need further instruction             PT Short Term Goals - 04/18/21 1319      PT SHORT TERM GOAL #1   Title Pt will be independent with HEP for strength, gait, and balance.  TARGET 05/23/2021    Baseline not currently performing HEP-pt lost previous HEP packet    Time 4    Period Weeks    Status New      PT SHORT TERM GOAL #2   Title Pt will decrease 5 x sit to stand from 20.44 sec to <15 sec for improved balance and functional strength.    Baseline 20.44 sec no UE support    Time 4    Period Weeks    Status New      PT SHORT TERM GOAL #3   Title Pt will improve TUG score to less than or equal to 15 seconds for decreased fall risk.    Baseline TUG 19.19 sec    Time 4    Period Weeks    Status New      PT SHORT TERM GOAL #4   Title Pt will verbalize understanding of fall prevention education.    Baseline Pt at fall risk per TUG and DGI scores    Time 4    Period Weeks    Status New      PT SHORT TERM GOAL #5   Title Pt will improve DGI score to at least 9/24 for imrpoved balance without device in short distances.    Baseline 5/24    Time 4    Period Weeks    Status New             PT Long Term Goals - 04/18/21 1323      PT LONG TERM GOAL #1   Title Pt will be independent with progression of HEP for improved balance, strength, gait.  TARGET 06/20/21    Baseline not currently performing an HEP    Time 8    Period Weeks    Status New      PT LONG TERM GOAL #2   Title Pt will improve  gait velocity to at least 2.62 ft/sec for improved gait efficiency and safety.    Baseline 2.07 ft/sec at eval (no device)    Time 8    Period Weeks    Status New      PT LONG TERM GOAL #3   Title Pt will improve TUG score to less than or equal to 13.5 sec for decreased fall risk.    Baseline 19.19 sec    Time 8    Period Weeks    Status New      PT LONG TERM GOAL #4   Title Pt will ambulate up/down 4 steps with bilateral rails supervision for improved safety with household access.    Baseline  supervision/min guard, decreased foot clearance.    Time 8    Period Weeks    Status New      PT LONG TERM GOAL #5   Title Pt will ambulate at least 1000 ft indoor and outdoor surfaces, using rollator, mod I, for improved community gait.    Baseline supervision/min guard with no device short distance gait    Time 8    Period Weeks    Status New                 Plan - 04/30/21 6945    Clinical Impression Statement Today's skilled session initially focused on review and revisment of pt's HEP from previous episode of care. No issues noted or reported with performance of ex's in session except cues on form/technique. Remainder of session focused on use of Nustep for strenthening and activity tolerance with no issues reported. The pt is progressing toward goals and should benefit from continued PT to progress toward unmet goals.    Personal Factors and Comorbidities Comorbidity 3+;Behavior Pattern    Comorbidities See above    Examination-Activity Limitations Locomotion Level;Transfers;Stairs;Stand    Examination-Participation Restrictions Occupation;Community Activity;Shop    Stability/Clinical Decision Making Evolving/Moderate complexity    Rehab Potential Good    PT Frequency 2x / week    PT Duration 8 weeks   plus eval   PT Treatment/Interventions DME Instruction;Neuromuscular re-education;Balance training;Therapeutic exercise;Therapeutic activities;Functional mobility training;Stair training;Gait training;Patient/family education;Orthotic Fit/Training;Passive range of motion;Manual techniques    PT Next Visit Plan continue to work on strengthening, balance, gait and stair training, fall prevention education    PT Home Exercise Plan Access Code: [email or web address]    Consulted and Agree with Plan of Care Patient           Patient will benefit from skilled therapeutic intervention in order to improve the following deficits and impairments:  Abnormal gait,Difficulty walking,Decreased  balance,Decreased mobility,Decreased strength  Visit Diagnosis: Other abnormalities of gait and mobility  Muscle weakness (generalized)  Unsteadiness on feet  Abnormal posture     Problem List Patient Active Problem List   Diagnosis Date Noted  . Physical deconditioning 04/14/2021  . Balance disorder 04/14/2021  . Shoulder disorder 04/14/2021  . 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 Lymphedema of left upper extremity 12/09/2020  . Brain metastases ([place]) 11/01/2019  . Thrombocytopenia ([place]) 08/09/2019  . Port-A-Cath in place 06/20/2019  . 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 S/P mastectomy, left 12/09/2018  . S/P breast reconstruction, left 12/09/2018  . S/P radiation therapy 12/09/2018  . GERD (gastroesophageal reflux disease) 12/10/2017  . Edema 11/09/2017  . Sensorineural hearing loss (SNHL) of both ears 11/05/2017  . Vitamin D deficiency 01/18/2017  . Bipolar I disorder, most recent episode  depressed ([place]) 12/08/2016  . Adjustment disorder with anxiety 12/06/2016  . History of cancer metastatic to brain 07/27/2016  . Iron deficiency anemia 06/26/2016  . Bone metastases ([place]) 06/03/2016  . Primary cancer of lower-inner quadrant of left female breast ([place]) 06/01/2016  . Tobacco abuse 07/22/2015  . Alcoholism in remission ([place]) 07/22/2015  . Malignant neoplasm of female breast ([place]) 07/01/2015  . Current smoker 03/28/2015  . Goals of care, counseling/discussion 03/28/2015  . Seasonal allergies 03/28/2015  . Anxiety state 02/28/2015  . Fibromyalgia 02/28/2015  . Family history of diabetes mellitus 02/28/2015  . H/O alcohol abuse    Willow Ora, [place], [facility] [address] [place], [place] [address] [phone] 05/01/21, 12:56 PM   Name: [name] MRN: [id number] Date of Birth: 1961-07-13

## 2021-05-02 ENCOUNTER — Ambulatory Visit: Payer: Medicaid Other | Admitting: Physical Therapy

## 2021-05-06 ENCOUNTER — Ambulatory Visit: Payer: Medicaid Other | Admitting: Occupational Therapy

## 2021-05-06 ENCOUNTER — Ambulatory Visit: Payer: Medicaid Other

## 2021-05-08 ENCOUNTER — Ambulatory Visit: Payer: Medicaid Other

## 2021-05-08 ENCOUNTER — Other Ambulatory Visit: Payer: Self-pay

## 2021-05-08 ENCOUNTER — Other Ambulatory Visit: Payer: Self-pay | Admitting: Hematology and Oncology

## 2021-05-08 ENCOUNTER — Inpatient Hospital Stay: Payer: Medicaid Other

## 2021-05-08 ENCOUNTER — Inpatient Hospital Stay: Payer: Medicaid Other | Attending: Medical

## 2021-05-08 VITALS — BP 110/73 | HR 88 | Temp 98.1°F | Resp 17 | Wt 157.0 lb

## 2021-05-08 DIAGNOSIS — Z5112 Encounter for antineoplastic immunotherapy: Secondary | ICD-10-CM | POA: Diagnosis not present

## 2021-05-08 DIAGNOSIS — C770 Secondary and unspecified malignant neoplasm of lymph nodes of head, face and neck: Secondary | ICD-10-CM

## 2021-05-08 DIAGNOSIS — C50119 Malignant neoplasm of central portion of unspecified female breast: Secondary | ICD-10-CM

## 2021-05-08 DIAGNOSIS — C50312 Malignant neoplasm of lower-inner quadrant of left female breast: Secondary | ICD-10-CM

## 2021-05-08 DIAGNOSIS — Z79899 Other long term (current) drug therapy: Secondary | ICD-10-CM | POA: Insufficient documentation

## 2021-05-08 DIAGNOSIS — Z95828 Presence of other vascular implants and grafts: Secondary | ICD-10-CM

## 2021-05-08 DIAGNOSIS — D696 Thrombocytopenia, unspecified: Secondary | ICD-10-CM

## 2021-05-08 DIAGNOSIS — C7951 Secondary malignant neoplasm of bone: Secondary | ICD-10-CM | POA: Insufficient documentation

## 2021-05-08 MED ORDER — ACETAMINOPHEN 325 MG PO TABS: 650.0000 mg | ORAL_TABLET | Freq: Once | ORAL | Status: DC

## 2021-05-08 MED ORDER — HEPARIN SOD (PORK) LOCK FLUSH 100 UNIT/ML IV SOLN
500.0000 [IU] | Freq: Once | INTRAVENOUS | Status: AC | PRN
  Administered 2021-05-08: 500 [IU]
  Filled 2021-05-08: qty 5

## 2021-05-08 MED ORDER — SODIUM CHLORIDE 0.9 % IV SOLN
Freq: Once | INTRAVENOUS | Status: AC
  Filled 2021-05-08: qty 250

## 2021-05-08 MED ORDER — SODIUM CHLORIDE 0.9 % IV SOLN: 6.0000 mg/kg | Freq: Once | INTRAVENOUS | Status: DC

## 2021-05-08 MED ORDER — SODIUM CHLORIDE 0.9% FLUSH
10.0000 mL | INTRAVENOUS | Status: DC | PRN
  Administered 2021-05-08: 10 mL
  Filled 2021-05-08: qty 10

## 2021-05-08 MED ORDER — ACETAMINOPHEN 325 MG PO TABS
ORAL_TABLET | ORAL | Status: AC
  Filled 2021-05-08: qty 2

## 2021-05-08 MED ORDER — SODIUM CHLORIDE 0.9% FLUSH
10.0000 mL | INTRAVENOUS | Status: AC | PRN
Start: 2021-05-08 — End: 2021-05-08
  Administered 2021-05-08: 10 mL
  Filled 2021-05-08: qty 10

## 2021-05-08 NOTE — Progress Notes (Signed)
Pt. one week early for infusion appt. due to scheduling. Per Dr. Lorenso Courier, no treatment today and pt. sent to scheduling to reschedule for 6/16 or 05/16/21. Scheduling message sent as too.

## 2021-05-09 ENCOUNTER — Telehealth: Payer: Self-pay | Admitting: Oncology

## 2021-05-09 NOTE — Telephone Encounter (Signed)
Sch per 6/7 sch msg , pt aware.

## 2021-05-12 ENCOUNTER — Other Ambulatory Visit: Payer: Self-pay | Admitting: Pharmacist

## 2021-05-12 ENCOUNTER — Telehealth: Payer: Self-pay

## 2021-05-12 NOTE — Telephone Encounter (Signed)
Pt called in to confirm appts this Thursday at 1230 port flush w/ lab and infusion. Pt verbalized thanks and understanding.

## 2021-05-13 ENCOUNTER — Ambulatory Visit: Payer: Medicaid Other | Admitting: Physical Therapy

## 2021-05-13 ENCOUNTER — Ambulatory Visit: Payer: Medicaid Other | Admitting: Occupational Therapy

## 2021-05-14 ENCOUNTER — Other Ambulatory Visit: Payer: Self-pay

## 2021-05-14 DIAGNOSIS — C770 Secondary and unspecified malignant neoplasm of lymph nodes of head, face and neck: Secondary | ICD-10-CM

## 2021-05-15 ENCOUNTER — Other Ambulatory Visit: Payer: Self-pay | Admitting: Oncology

## 2021-05-15 ENCOUNTER — Ambulatory Visit: Payer: Medicaid Other

## 2021-05-15 ENCOUNTER — Other Ambulatory Visit: Payer: Self-pay

## 2021-05-15 ENCOUNTER — Inpatient Hospital Stay: Payer: Medicaid Other

## 2021-05-15 ENCOUNTER — Ambulatory Visit: Payer: Medicaid Other | Admitting: Physical Therapy

## 2021-05-15 ENCOUNTER — Encounter: Payer: Self-pay | Admitting: Physical Therapy

## 2021-05-15 VITALS — BP 110/66 | HR 79 | Temp 98.2°F | Resp 20

## 2021-05-15 DIAGNOSIS — R2681 Unsteadiness on feet: Secondary | ICD-10-CM

## 2021-05-15 DIAGNOSIS — D696 Thrombocytopenia, unspecified: Secondary | ICD-10-CM

## 2021-05-15 DIAGNOSIS — Z5112 Encounter for antineoplastic immunotherapy: Secondary | ICD-10-CM | POA: Diagnosis not present

## 2021-05-15 DIAGNOSIS — Z95828 Presence of other vascular implants and grafts: Secondary | ICD-10-CM

## 2021-05-15 DIAGNOSIS — C770 Secondary and unspecified malignant neoplasm of lymph nodes of head, face and neck: Secondary | ICD-10-CM

## 2021-05-15 DIAGNOSIS — C50312 Malignant neoplasm of lower-inner quadrant of left female breast: Secondary | ICD-10-CM

## 2021-05-15 DIAGNOSIS — C50119 Malignant neoplasm of central portion of unspecified female breast: Secondary | ICD-10-CM

## 2021-05-15 DIAGNOSIS — M6281 Muscle weakness (generalized): Secondary | ICD-10-CM

## 2021-05-15 DIAGNOSIS — R2689 Other abnormalities of gait and mobility: Secondary | ICD-10-CM

## 2021-05-15 LAB — CMP (CANCER CENTER ONLY)
ALT: 17 U/L (ref 0–44)
AST: 23 U/L (ref 15–41)
Albumin: 3.7 g/dL (ref 3.5–5.0)
Alkaline Phosphatase: 66 U/L (ref 38–126)
Anion gap: 7 (ref 5–15)
BUN: 12 mg/dL (ref 6–20)
CO2: 27 mmol/L (ref 22–32)
Calcium: 9.5 mg/dL (ref 8.9–10.3)
Chloride: 101 mmol/L (ref 98–111)
Creatinine: 0.91 mg/dL (ref 0.44–1.00)
GFR, Estimated: >60 mL/min (ref >60)
Glucose, Bld: 101 mg/dL — ABNORMAL HIGH (ref 70–99)
Potassium: 4.2 mmol/L (ref 3.5–5.1)
Sodium: 135 mmol/L (ref 135–145)
Total Bilirubin: <0.2 mg/dL — ABNORMAL LOW (ref 0.3–1.2)
Total Protein: 6.5 g/dL (ref 6.5–8.1)

## 2021-05-15 LAB — CBC WITH DIFFERENTIAL (CANCER CENTER ONLY)
Abs Immature Granulocytes: 0.03 10*3/uL (ref 0.00–0.07)
Basophils Absolute: 0 10*3/uL (ref 0.0–0.1)
Basophils Relative: 0 %
Eosinophils Absolute: 0 10*3/uL (ref 0.0–0.5)
Eosinophils Relative: 0 %
HCT: 30.5 % — ABNORMAL LOW (ref 36.0–46.0)
Hemoglobin: 9.8 g/dL — ABNORMAL LOW (ref 12.0–15.0)
Immature Granulocytes: 1 %
Lymphocytes Relative: 21 %
Lymphs Abs: 1 10*3/uL (ref 0.7–4.0)
MCH: 29 pg (ref 26.0–34.0)
MCHC: 32.1 g/dL (ref 30.0–36.0)
MCV: 90.2 fL (ref 80.0–100.0)
Monocytes Absolute: 0.3 10*3/uL (ref 0.1–1.0)
Monocytes Relative: 7 %
Neutro Abs: 3.6 10*3/uL (ref 1.7–7.7)
Neutrophils Relative %: 71 %
Platelet Count: 111 10*3/uL — ABNORMAL LOW (ref 150–400)
RBC: 3.38 MIL/uL — ABNORMAL LOW (ref 3.87–5.11)
RDW: 13.1 % (ref 11.5–15.5)
WBC Count: 5 10*3/uL (ref 4.0–10.5)
nRBC: 0 % (ref 0.0–0.2)

## 2021-05-15 MED ORDER — ACETAMINOPHEN 325 MG PO TABS
ORAL_TABLET | ORAL | Status: AC
  Filled 2021-05-15: qty 2

## 2021-05-15 MED ORDER — HEPARIN SOD (PORK) LOCK FLUSH 100 UNIT/ML IV SOLN
500.0000 [IU] | Freq: Once | INTRAVENOUS | Status: AC | PRN
Start: 2021-05-15 — End: 2021-05-15
  Administered 2021-05-15: 500 [IU]
  Filled 2021-05-15: qty 5

## 2021-05-15 MED ORDER — SODIUM CHLORIDE 0.9% FLUSH
10.0000 mL | INTRAVENOUS | Status: DC | PRN
  Administered 2021-05-15: 10 mL via INTRAVENOUS
  Filled 2021-05-15: qty 10

## 2021-05-15 MED ORDER — TRASTUZUMAB-DKST CHEMO 150 MG IV SOLR
6.0000 mg/kg | Freq: Once | INTRAVENOUS | Status: AC
  Administered 2021-05-15: 399 mg via INTRAVENOUS
  Filled 2021-05-15: qty 19

## 2021-05-15 MED ORDER — ACETAMINOPHEN 325 MG PO TABS
650.0000 mg | ORAL_TABLET | Freq: Once | ORAL | Status: AC
  Administered 2021-05-15: 650 mg via ORAL

## 2021-05-15 MED ORDER — SODIUM CHLORIDE 0.9 % IV SOLN
Freq: Once | INTRAVENOUS | Status: AC
Start: 2021-05-15 — End: 2021-05-15
  Filled 2021-05-15: qty 250

## 2021-05-15 MED ORDER — SODIUM CHLORIDE 0.9% FLUSH
10.0000 mL | INTRAVENOUS | Status: DC | PRN
  Administered 2021-05-15: 10 mL
  Filled 2021-05-15: qty 10

## 2021-05-15 NOTE — Progress Notes (Signed)
Per Dr. [name], ok for treatment today with ECHO 12/31/20

## 2021-05-15 NOTE — Patient Instructions (Signed)

## 2021-05-15 NOTE — Patient Instructions (Signed)
[facility]  Discharge Instructions: ?Thank you for choosing [facility] to provide your oncology and hematology care.  ? ?If you have a lab appointment with the Cancer Center, please go directly to the Cancer Center and check in at the registration area. ?  ?Wear comfortable clothing and clothing appropriate for easy access to any Portacath or PICC line.  ? ?We strive to give you quality time with your provider. You may need to reschedule your appointment if you arrive late (15 or more minutes).  Arriving late affects you and other patients whose appointments are after yours.  Also, if you miss three or more appointments without notifying the office, you may be dismissed from the clinic at the provider?s discretion.    ?  ?For prescription refill requests, have your pharmacy contact our office and allow 72 hours for refills to be completed.   ? ?Today you received the following chemotherapy and/or immunotherapy agent: Trastuzumab ?  ?To help prevent nausea and vomiting after your treatment, we encourage you to take your nausea medication as directed. ? ?BELOW ARE SYMPTOMS THAT SHOULD BE REPORTED IMMEDIATELY: ?*FEVER GREATER THAN 100.4 F (38 ?C) OR HIGHER ?*CHILLS OR SWEATING ?*NAUSEA AND VOMITING THAT IS NOT CONTROLLED WITH YOUR NAUSEA MEDICATION ?*UNUSUAL SHORTNESS OF BREATH ?*UNUSUAL BRUISING OR BLEEDING ?*URINARY PROBLEMS (pain or burning when urinating, or frequent urination) ?*BOWEL PROBLEMS (unusual diarrhea, constipation, pain near the anus) ?TENDERNESS IN MOUTH AND THROAT WITH OR WITHOUT PRESENCE OF ULCERS (sore throat, sores in mouth, or a toothache) ?UNUSUAL RASH, SWELLING OR PAIN  ?UNUSUAL VAGINAL DISCHARGE OR ITCHING  ? ?Items with * indicate a potential emergency and should be followed up as soon as possible or go to the Emergency Department if any problems should occur. ? ?Please show the CHEMOTHERAPY ALERT CARD or IMMUNOTHERAPY ALERT CARD at check-in to  the Emergency Department and triage nurse. ? ?Should you have questions after your visit or need to cancel or reschedule your appointment, please contact [facility]  Dept: [phone]  and follow the prompts.  Office hours are 8:00 a.m. to 4:30 p.m. Monday - Friday. Please note that voicemails left after 4:00 p.m. may not be returned until the following business day.  We are closed weekends and major holidays. You have access to a nurse at all times for urgent questions. Please call the main number to the clinic Dept: [phone] and follow the prompts. ? ? ?For any non-urgent questions, you may also contact your provider using MyChart. We now offer e-Visits for anyone 18 and older to request care online for non-urgent symptoms. For details visit [email or web address]. ?  ?Also download the MyChart app! Go to the app store, search "MyChart", open the app, select [facility], and log in with your MyChart username and password. ? ?Due to Covid, a mask is required upon entering the hospital/clinic. If you do not have a mask, one will be given to you upon arrival. For doctor visits, patients may have 1 support person aged 18 or older with them. For treatment visits, patients cannot have anyone with them due to current Covid guidelines and our immunocompromised population.  ? ?

## 2021-05-15 NOTE — Therapy (Signed)
[facility] [address] [place], [place], [address] Phone: [phone]   Fax:  [phone]  Physical Therapy Treatment  Patient Details  Name: Crystalina Reasons MRN: [id number] Date of Birth: 12/05/1960 Referring Provider (PT): Magrinat, Gustav   Encounter Date: 05/15/2021   PT End of Session - 05/15/21 0852     Visit Number 3    Number of Visits 17    Authorization Type Medicaid    Authorization Time Period 12 visits from 04/30/21-06/10/21    Authorization - Visit Number 2    Authorization - Number of Visits 12    PT Start Time 0850    PT Stop Time 0929    PT Time Calculation (min) 39 min    Equipment Utilized During Treatment Gait belt    Activity Tolerance Patient tolerated treatment well;Patient limited by fatigue    Behavior During Therapy WFL for tasks assessed/performed;Impulsive             Past Medical History:  Diagnosis Date   Alcohol abuse    none since 2013   Anemia    during chemo   Anxiety    At age 60   Arthritis Dx 2010   Bipolar disorder (HCC)    Bronchitis    Cancer (HCC)    breast mets to brain   CHF (congestive heart failure) (HCC)    Chronic pain    resolved per patient 12/25/19   Complication of anesthesia    Depression    Family history of adverse reaction to anesthesia    MOther had PONV   GERD (gastroesophageal reflux disease)    Headache    hx  migraines   HLD (hyperlipidemia)    Hypertension    Lymphedema of left arm    Opiate dependence (HCC)    PONV (postoperative nausea and vomiting)    Port-A-Cath in place    PTSD (post-traumatic stress disorder)    S/P endometrial ablation    in md's office    Past Surgical History:  Procedure Laterality Date   APPLICATION OF CRANIAL NAVIGATION N/A 08/14/2016   Procedure: APPLICATION OF CRANIAL NAVIGATION;  Surgeon: Joseph Stern, MD;  Location: [facility];  Service: Neurosurgery;  Laterality: N/A;   BREAST RECONSTRUCTION Left     with silicone implant   COLONOSCOPY W/ POLYPECTOMY     CRANIOTOMY N/A 08/14/2016   Procedure: CRANIOTOMY TUMOR EXCISION WITH BRAINLAB;  Surgeon: Joseph Stern, MD;  Location: [facility];  Service: Neurosurgery;  Laterality: N/A;   FIBULA FRACTURE SURGERY Left    MASTECTOMY Left    RADIOLOGY WITH ANESTHESIA N/A 07/23/2016   Procedure: MRI OF BRAIN WITH AND WITHOUT;  Surgeon: Medication Radiologist, MD;  Location: [facility];  Service: Radiology;  Laterality: N/A;   RADIOLOGY WITH ANESTHESIA N/A 09/08/2016   Procedure: MRI OF BRAIN WITH AND WITHOUT CONTRAST;  Surgeon: Medication Radiologist, MD;  Location: [facility];  Service: Radiology;  Laterality: N/A;   RADIOLOGY WITH ANESTHESIA N/A 12/10/2016   Procedure: MRI OF BRAIN WITH AND WITHOUT;  Surgeon: Medication Radiologist, MD;  Location: [facility];  Service: Radiology;  Laterality: N/A;   RADIOLOGY WITH ANESTHESIA N/A 03/02/2017   Procedure: MRI of BRAIN W and W/OUT CONTRAST;  Surgeon: Medication Radiologist, MD;  Location: [facility];  Service: Radiology;  Laterality: N/A;   RADIOLOGY WITH ANESTHESIA N/A 07/29/2017   Procedure: RADIOLOGY WITH ANESTHESIA MRI OF BRAIN WITH AND WITHOUT CONTRAST;  Surgeon: Radiologist, Medication, MD;  Location: [facility];    Service: Radiology;  Laterality: N/A;   RADIOLOGY WITH ANESTHESIA N/A 12/07/2017   Procedure: MRI WITH ANESTHESIA OF BRAIN WITH AND WITHOUT CONTRAST;  Surgeon: Radiologist, Medication, MD;  Location: [facility];  Service: Radiology;  Laterality: N/A;   RADIOLOGY WITH ANESTHESIA N/A 04/07/2018   Procedure: MRI OF BRAIN WITH AND WITHOUT CONTRAST;  Surgeon: Radiologist, Medication, MD;  Location: [facility];  Service: Radiology;  Laterality: N/A;   RADIOLOGY WITH ANESTHESIA N/A 08/23/2018   Procedure: MRI WITH ANESTHESIA OF THE BRAIN WITH AND WITHOUT;  Surgeon: Radiologist, Medication, MD;  Location: [facility];  Service: Radiology;  Laterality: N/A;   RADIOLOGY WITH ANESTHESIA N/A 01/24/2019   Procedure: MRI OF BRAIN WITH AND WITHOUT  CONTRAST;  Surgeon: Radiologist, Medication, MD;  [facility]: [facility];  Service: Radiology;  Laterality: N/A;   RADIOLOGY WITH ANESTHESIA N/A 07/06/2019   Procedure: MRI WITH ANESTHESIA OF BRAIN WITH AND WITHOUT CONTRAST;  Surgeon: Radiologist, Medication, MD;  [facility]: [facility];  Service: Radiology;  Laterality: N/A;   RADIOLOGY WITH ANESTHESIA N/A 01/11/2020   Procedure: MRI WITH ANESTHESIA BRAIN WITH AND WITHOUT;  Surgeon: Radiologist, Medication, MD;  [facility]: [facility];  Service: Radiology;  Laterality: N/A;   RADIOLOGY WITH ANESTHESIA N/A 05/21/2020   Procedure: MRI WITH ANESTHESIA OF BRAIN WITH AND WITHOUT CONTRAST;  Surgeon: Radiologist, Medication, MD;  Location: [facility];  Service: Radiology;  Laterality: N/A;   RADIOLOGY WITH ANESTHESIA N/A 01/02/2021   Procedure: MRI WITH ANESTHESIA  BRAIN WITH AND WITHOUT CONTRAST,TOTAL SPINE MET SCREENING;  Surgeon: Radiologist, Medication, MD;  Location: [facility];  Service: Radiology;  Laterality: N/A;   right power port placement Right     There were no vitals filed for this visit.   Subjective Assessment - 05/15/21 0852     Subjective No pain, no changes since last visit.  No falls.  Forgot my rollator, as the transportation was early.    Pertinent History PMH: breast cancer with mets to brain/lymph node, alcohol abuse (none since 2013), anemia (during chemo), anxiety, arthritis, bipoloar, bronchitis, headaches (hx of migraines), lymphedema of left arm, PTSD    Limitations Sitting;Standing;Walking    Patient Stated Goals Pt's goals for PT are to get back on my feet more steady.    Currently in Pain? No/denies                               [facility] Adult PT Treatment/Exercise - 05/15/21 0001       Ambulation/Gait   Ambulation/Gait Yes    Ambulation/Gait Assistance 5: Supervision    Ambulation/Gait Assistance Details Using clinic rollator    Ambulation Distance (Feet) 230 Feet   250 ft   Assistive device Rollator    Gait Pattern  Step-through pattern;Decreased arm swing - right;Decreased arm swing - left;Decreased step length - right;Decreased step length - left;Poor foot clearance - left;Narrow base of support;Shuffle;Poor foot clearance - right    Ambulation Surface Level;Indoor    Gait Comments With gait, PT provides cues to slow pace and for foot clearance, to stay close to rollator.  Pt needs cues to stop and reset, to slow pace and improve foot clearance.      Exercises   Exercises Other Exercises    Other Exercises  SEated core stability exercises for abdominal activation:  ant/posterior pelvic tilts x 10, abdominal activation with single alt UE lifts x 5, BUE lifts x 5 reps, upper trunk rotation x 5 reps, forward   lean>upright posture x 10 reps (hold upright 5 sec).  Abdominal activation then marching:  2 sets x 5 reps, LAQ 2 sets x 5 reps.      Knee/Hip Exercises: Aerobic   Nustep BLEs, Level 3 x 6 minutes, cues to keep steps/min at least 30, for strength and flexibility.  Pt reports fatigue.                Access Code: [email or web address] URL: [email or web address] Date: 04/30/2021 Prepared by: Kathy Bury   Exercises-Reviewed HEP from last visit-pt return demo understanding; cues for seated exercises to work up to 3 sets of 10 Sit to Stand - 1 x daily - 5 x weekly - 1 sets - 10 reps Seated March with Resistance - 1 x daily - 5 x weekly - 1 sets - 10 reps Seated Hip Abduction with Resistance - 1 x daily - 5 x weekly - 1 sets - 10 reps - 3 hold Heel Toe Raises with Counter Support - 1 x daily - 5 x weekly - 1 sets - 10 reps Standing Hip Abduction - 1 x daily - 5 x weekly - 1 sets - 10 reps (cues for foot position to avoid external rotation) Narrow Stance with Counter Support - 1 x daily - 5 x weekly - 1 sets - 3 reps - 20 hold (cues for technique) Standing Single Leg Stance with Counter Support - 1 x daily - 5 x weekly - 1 sets - 3 reps - 10 hold       PT Short Term Goals - 04/18/21 1319        PT SHORT TERM GOAL #1   Title Pt will be independent with HEP for strength, gait, and balance.  TARGET 05/23/2021    Baseline not currently performing HEP-pt lost previous HEP packet    Time 4    Period Weeks    Status New      PT SHORT TERM GOAL #2   Title Pt will decrease 5 x sit to stand from 20.44 sec to <15 sec for improved balance and functional strength.    Baseline 20.44 sec no UE support    Time 4    Period Weeks    Status New      PT SHORT TERM GOAL #3   Title Pt will improve TUG score to less than or equal to 15 seconds for decreased fall risk.    Baseline TUG 19.19 sec    Time 4    Period Weeks    Status New      PT SHORT TERM GOAL #4   Title Pt will verbalize understanding of fall prevention education.    Baseline Pt at fall risk per TUG and DGI scores    Time 4    Period Weeks    Status New      PT SHORT TERM GOAL #5   Title Pt will improve DGI score to at least 9/24 for imrpoved balance without device in short distances.    Baseline 5/24    Time 4    Period Weeks    Status New               PT Long Term Goals - 04/18/21 1323       PT LONG TERM GOAL #1   Title Pt will be independent with progression of HEP for improved balance, strength, gait.  TARGET 06/20/21    Baseline not currently performing an HEP    Time 8      Period Weeks    Status New      PT LONG TERM GOAL #2   Title Pt will improve gait velocity to at least 2.62 ft/sec for improved gait efficiency and safety.    Baseline 2.07 ft/sec at eval (no device)    Time 8    Period Weeks    Status New      PT LONG TERM GOAL #3   Title Pt will improve TUG score to less than or equal to 13.5 sec for decreased fall risk.    Baseline 19.19 sec    Time 8    Period Weeks    Status New      PT LONG TERM GOAL #4   Title Pt will ambulate up/down 4 steps with bilateral rails supervision for improved safety with household access.    Baseline supervision/min guard, decreased foot clearance.     Time 8    Period Weeks    Status New      PT LONG TERM GOAL #5   Title Pt will ambulate at least 1000 ft indoor and outdoor surfaces, using rollator, mod I, for improved community gait.    Baseline supervision/min guard with no device short distance gait    Time 8    Period Weeks    Status New                   Plan - 05/15/21 4497     Clinical Impression Statement Reviewed HEP this visit, with pt requiring cues for correct technique of exercises in sitting/standing.  With standing exercises, she requires definite UE support and noted to have sway through trunk.  Focused some time during remainder of session on trunk stability/abdominal activation for improved stability through trunk for overall improved stability and mobility.    Personal Factors and Comorbidities Comorbidity 3+;Behavior Pattern    Comorbidities See above    Examination-Activity Limitations Locomotion Level;Transfers;Stairs;Stand    Examination-Participation Restrictions Occupation;Community Activity;Shop    Stability/Clinical Decision Making Evolving/Moderate complexity    Rehab Potential Good    PT Frequency 2x / week    PT Duration 8 weeks   plus eval   PT Treatment/Interventions DME Instruction;Neuromuscular re-education;Balance training;Therapeutic exercise;Therapeutic activities;Functional mobility training;Stair training;Gait training;Patient/family education;Orthotic Fit/Training;Passive range of motion;Manual techniques    PT Next Visit Plan Core stability/abdominal activation (try on therapy ball), continue to work on strengthening, balance, gait and stair training, fall prevention education    PT Home Exercise Plan Access Code: [email or web address]    Consulted and Agree with Plan of Care Patient             Patient will benefit from skilled therapeutic intervention in order to improve the following deficits and impairments:  Abnormal gait, Difficulty walking, Decreased balance, Decreased mobility,  Decreased strength  Visit Diagnosis: Other abnormalities of gait and mobility  Muscle weakness (generalized)  Unsteadiness on feet     Problem List Patient Active Problem List   Diagnosis Date Noted   Physical deconditioning 04/14/2021   Balance disorder 04/14/2021   Shoulder disorder 04/14/2021   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/11/2021   Lymphedema of left upper extremity 12/09/2020   Brain metastases ([place]) 11/01/2019   Thrombocytopenia ([place]) 08/09/2019   Port-A-Cath in place 06/20/2019   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[place]) 03/30/2019   S/P mastectomy, left 12/09/2018   S/P breast reconstruction, left 12/09/2018   S/P radiation therapy 12/09/2018   GERD (gastroesophageal reflux disease) 12/10/2017   Edema 11/09/2017  Sensorineural hearing loss (SNHL) of both ears 11/05/2017   Vitamin D deficiency 01/18/2017   Bipolar I disorder, most recent episode depressed (HCC) 12/08/2016   Adjustment disorder with anxiety 12/06/2016   History of cancer metastatic to brain 07/27/2016   Iron deficiency anemia 06/26/2016   Bone metastases (HCC) 06/03/2016   Primary cancer of lower-inner quadrant of left female breast (HCC) 06/01/2016   Tobacco abuse 07/22/2015   Alcoholism in remission (HCC) 07/22/2015   Malignant neoplasm of female breast (HCC) 07/01/2015   Current smoker 03/28/2015   Goals of care, counseling/discussion 03/28/2015   Seasonal allergies 03/28/2015   Anxiety state 02/28/2015   Fibromyalgia 02/28/2015   Family history of diabetes mellitus 02/28/2015   H/O alcohol abuse     , W. 05/15/2021, 9:26 AM  , PT 05/15/21 9:27 AM Phone: [phone] Fax: [phone]  [facility] [address] [place], [place], [address] Phone: [phone]   Fax:  [phone]  Name: Lagina Hasegawa MRN: [id number] Date of Birth: 08/02/1961    

## 2021-05-16 ENCOUNTER — Telehealth: Payer: Self-pay | Admitting: Oncology

## 2021-05-16 NOTE — Telephone Encounter (Signed)
Sch per 6/8  los, pt aware.

## 2021-05-19 ENCOUNTER — Other Ambulatory Visit: Payer: Self-pay | Admitting: Oncology

## 2021-05-20 ENCOUNTER — Other Ambulatory Visit: Payer: Self-pay

## 2021-05-20 ENCOUNTER — Ambulatory Visit: Payer: Medicaid Other | Admitting: Physical Therapy

## 2021-05-20 ENCOUNTER — Encounter: Payer: Self-pay | Admitting: Physical Therapy

## 2021-05-20 DIAGNOSIS — R293 Abnormal posture: Secondary | ICD-10-CM

## 2021-05-20 DIAGNOSIS — M6281 Muscle weakness (generalized): Secondary | ICD-10-CM

## 2021-05-20 DIAGNOSIS — R2689 Other abnormalities of gait and mobility: Secondary | ICD-10-CM

## 2021-05-20 DIAGNOSIS — R2681 Unsteadiness on feet: Secondary | ICD-10-CM

## 2021-05-20 DIAGNOSIS — Z5112 Encounter for antineoplastic immunotherapy: Secondary | ICD-10-CM | POA: Diagnosis not present

## 2021-05-20 NOTE — Patient Instructions (Signed)

## 2021-05-20 NOTE — Therapy (Signed)
[facility] [address] [place], [place], [address] Phone: [phone]   Fax:  [phone]  Physical Therapy Treatment  Patient Details  Name: [name] MRN: [id number] Date of Birth: December 20, 1960 Referring Provider (PT): Magrinat, Sarajane Jews   Encounter Date: 05/20/2021   PT End of Session - 05/20/21 0854     Visit Number 4    Number of Visits 17    Authorization Type Medicaid    Authorization Time Period 12 visits from 04/30/21-06/10/21    Authorization - Visit Number 3    Authorization - Number of Visits 12    PT Start Time 0850    PT Stop Time 0930    PT Time Calculation (min) 40 min    Equipment Utilized During Treatment Gait belt    Activity Tolerance Patient tolerated treatment well;Patient limited by fatigue    Behavior During Therapy [facility] for tasks assessed/performed;Impulsive             Past Medical History:  Diagnosis Date   Alcohol abuse    none since 2013   Anemia    during chemo   Anxiety    At age 34   Arthritis Dx 2010   Bipolar disorder ([place])    Bronchitis    Cancer ([place])    breast mets to brain   CHF (congestive heart failure) ([facility])    Chronic pain    resolved per patient 4/48/18   Complication of anesthesia    Depression    Family history of adverse reaction to anesthesia    MOther had PONV   GERD (gastroesophageal reflux disease)    Headache    hx  migraines   HLD (hyperlipidemia)    Hypertension    Lymphedema of left arm    Opiate dependence ([place])    PONV (postoperative nausea and vomiting)    Port-A-Cath in place    PTSD (post-traumatic stress disorder)    S/P endometrial ablation    in md's office    Past Surgical History:  Procedure Laterality Date   APPLICATION OF CRANIAL NAVIGATION N/A 08/14/2016   Procedure: APPLICATION OF CRANIAL NAVIGATION;  Surgeon: Erline Levine, MD;  Location: [facility];  Service: Neurosurgery;  Laterality: N/A;   BREAST RECONSTRUCTION Left     with silicone implant   COLONOSCOPY W/ POLYPECTOMY     CRANIOTOMY N/A 08/14/2016   Procedure: CRANIOTOMY TUMOR EXCISION WITH Lucky Rathke;  Surgeon: Erline Levine, MD;  Location: [facility];  Service: Neurosurgery;  Laterality: N/A;   FIBULA FRACTURE SURGERY Left    MASTECTOMY Left    RADIOLOGY WITH ANESTHESIA N/A 07/23/2016   Procedure: MRI OF BRAIN WITH AND WITHOUT;  Surgeon: Medication Radiologist, MD;  Location: [facility];  Service: Radiology;  Laterality: N/A;   RADIOLOGY WITH ANESTHESIA N/A 09/08/2016   Procedure: MRI OF BRAIN WITH AND WITHOUT CONTRAST;  Surgeon: Medication Radiologist, MD;  Location: [facility];  Service: Radiology;  Laterality: N/A;   RADIOLOGY WITH ANESTHESIA N/A 12/10/2016   Procedure: MRI OF BRAIN WITH AND WITHOUT;  Surgeon: Medication Radiologist, MD;  Location: [facility];  Service: Radiology;  Laterality: N/A;   RADIOLOGY WITH ANESTHESIA N/A 03/02/2017   Procedure: MRI of BRAIN W and W/OUT CONTRAST;  Surgeon: Medication Radiologist, MD;  Location: [facility];  Service: Radiology;  Laterality: N/A;   RADIOLOGY WITH ANESTHESIA N/A 07/29/2017   Procedure: RADIOLOGY WITH ANESTHESIA MRI OF BRAIN WITH AND WITHOUT CONTRAST;  Surgeon: Radiologist, Medication, MD;  Location: [facility];  Service: Radiology;  Laterality: N/A;   RADIOLOGY WITH ANESTHESIA N/A 12/07/2017   Procedure: MRI WITH ANESTHESIA OF BRAIN WITH AND WITHOUT CONTRAST;  Surgeon: Radiologist, Medication, MD;  Location: [facility];  Service: Radiology;  Laterality: N/A;   RADIOLOGY WITH ANESTHESIA N/A 04/07/2018   Procedure: MRI OF BRAIN WITH AND WITHOUT CONTRAST;  Surgeon: Radiologist, Medication, MD;  Location: [facility];  Service: Radiology;  Laterality: N/A;   RADIOLOGY WITH ANESTHESIA N/A 08/23/2018   Procedure: MRI WITH ANESTHESIA OF THE BRAIN WITH AND WITHOUT;  Surgeon: Radiologist, Medication, MD;  Location: [facility];  Service: Radiology;  Laterality: N/A;   RADIOLOGY WITH ANESTHESIA N/A 01/24/2019   Procedure: MRI OF BRAIN WITH AND WITHOUT  CONTRAST;  Surgeon: Radiologist, Medication, MD;  Location: [facility];  Service: Radiology;  Laterality: N/A;   RADIOLOGY WITH ANESTHESIA N/A 07/06/2019   Procedure: MRI WITH ANESTHESIA OF BRAIN WITH AND WITHOUT CONTRAST;  Surgeon: Radiologist, Medication, MD;  Location: [facility];  Service: Radiology;  Laterality: N/A;   RADIOLOGY WITH ANESTHESIA N/A 01/11/2020   Procedure: MRI WITH ANESTHESIA BRAIN WITH AND WITHOUT;  Surgeon: Radiologist, Medication, MD;  Location: [facility];  Service: Radiology;  Laterality: N/A;   RADIOLOGY WITH ANESTHESIA N/A 05/21/2020   Procedure: MRI WITH ANESTHESIA OF BRAIN WITH AND WITHOUT CONTRAST;  Surgeon: Radiologist, Medication, MD;  Location: [facility];  Service: Radiology;  Laterality: N/A;   RADIOLOGY WITH ANESTHESIA N/A 01/02/2021   Procedure: MRI WITH ANESTHESIA  BRAIN WITH AND WITHOUT CONTRAST,TOTAL SPINE MET SCREENING;  Surgeon: Radiologist, Medication, MD;  Location: [facility];  Service: Radiology;  Laterality: N/A;   right power port placement Right     There were no vitals filed for this visit.   Subjective Assessment - 05/20/21 0852     Subjective No new complaints. No falls or pain to report. Has her rollator today. Reports feeling more unsteady since Sunday, unsure why.    Pertinent History PMH: breast cancer with mets to brain/lymph node, alcohol abuse (none since 2013), anemia (during chemo), anxiety, arthritis, bipoloar, bronchitis, headaches (hx of migraines), lymphedema of left arm, PTSD    Limitations Sitting;Standing;Walking    Patient Stated Goals Pt's goals for PT are to get back on my feet more steady.    Currently in Pain? No/denies    Pain Score 0-No pain                    [facility] Adult PT Treatment/Exercise - 05/20/21 0854       Transfers   Transfers Sit to Stand;Stand to Sit    Sit to Stand 5: Supervision;Without upper extremity assist;From chair/3-in-1    Stand to Sit 5: Supervision;Without upper extremity assist;To chair/3-in-1       Ambulation/Gait   Ambulation/Gait Yes    Ambulation/Gait Assistance 5: Supervision;4: Min guard;4: Min assist    Ambulation/Gait Assistance Details supervision indoors when not fatigued. min guard to min assist with gait outdoors, increased assist as pt fatigues as she tends to get too fast with increased shuffling of feet and rollator too far away.    Ambulation Distance (Feet) 250 Feet   x2 in/outdoors   Assistive device Rollator    Gait Pattern Step-through pattern;Decreased arm swing - right;Decreased arm swing - left;Decreased step length - right;Decreased step length - left;Poor foot clearance - left;Narrow base of support;Shuffle;Poor foot clearance - right    Ambulation Surface Level;Indoor      Self-Care   Self-Care Other Self-Care Comments    Other  Self-Care Comments  provided education on fall prevention strategies. Reviewed in session.      Exercises   Exercises Other Exercises    Other Exercises  seated on green air disc: pelvic rocks ant/posterior, then laterally for ~10 reps each; with LE's long arc quads, then marching, alternating x 10 reps each/bil LE's. Ending with alternating contralateral UE/LE raises for ~6-8 reps each side. max cues needed on posture and ex form/technique.                    PT Education - 05/20/21 0907     Education Details fall prevention education    Person(s) Educated Patient    Methods Explanation;Demonstration;Verbal cues;Handout    Comprehension Verbalized understanding;Returned demonstration;Verbal cues required;Need further instruction              PT Short Term Goals - 04/18/21 1319       PT SHORT TERM GOAL #1   Title Pt will be independent with HEP for strength, gait, and balance.  TARGET 05/23/2021    Baseline not currently performing HEP-pt lost previous HEP packet    Time 4    Period Weeks    Status New      PT SHORT TERM GOAL #2   Title Pt will decrease 5 x sit to stand from 20.44 sec to <15 sec for improved  balance and functional strength.    Baseline 20.44 sec no UE support    Time 4    Period Weeks    Status New      PT SHORT TERM GOAL #3   Title Pt will improve TUG score to less than or equal to 15 seconds for decreased fall risk.    Baseline TUG 19.19 sec    Time 4    Period Weeks    Status New      PT SHORT TERM GOAL #4   Title Pt will verbalize understanding of fall prevention education.    Baseline Pt at fall risk per TUG and DGI scores    Time 4    Period Weeks    Status New      PT SHORT TERM GOAL #5   Title Pt will improve DGI score to at least 9/24 for imrpoved balance without device in short distances.    Baseline 5/24    Time 4    Period Weeks    Status New               PT Long Term Goals - 04/18/21 1323       PT LONG TERM GOAL #1   Title Pt will be independent with progression of HEP for improved balance, strength, gait.  TARGET 06/20/21    Baseline not currently performing an HEP    Time 8    Period Weeks    Status New      PT LONG TERM GOAL #2   Title Pt will improve gait velocity to at least 2.62 ft/sec for improved gait efficiency and safety.    Baseline 2.07 ft/sec at eval (no device)    Time 8    Period Weeks    Status New      PT LONG TERM GOAL #3   Title Pt will improve TUG score to less than or equal to 13.5 sec for decreased fall risk.    Baseline 19.19 sec    Time 8    Period Weeks    Status New  PT LONG TERM GOAL #4   Title Pt will ambulate up/down 4 steps with bilateral rails supervision for improved safety with household access.    Baseline supervision/min guard, decreased foot clearance.    Time 8    Period Weeks    Status New      PT LONG TERM GOAL #5   Title Pt will ambulate at least 1000 ft indoor and outdoor surfaces, using rollator, mod I, for improved community gait.    Baseline supervision/min guard with no device short distance gait    Time 8    Period Weeks    Status New                   Plan -  05/20/21 0854     Clinical Impression Statement Today's skilled session initially focused on education on fall prevention strategies with no questions from pt after review in session. Remainder of session continued to focus on gait training wtih rollator. Pt tends to get very fast, increased speed with fatigued, creating instability and decreased safety. Mod cues at this time to slow down, use brakes on rollator and take longer steps. Pt able to return demo for very few steps. Does better to have her stop and "regroup" before continuing gait. Ended session with continued work on core and LE strengthening with only fatigue reported, no other isssues noted or reported. The pt is progressing toward goals and should benefit from continued PT to progress toward unmet goals.    Personal Factors and Comorbidities Comorbidity 3+;Behavior Pattern    Comorbidities See above    Examination-Activity Limitations Locomotion Level;Transfers;Stairs;Stand    Examination-Participation Restrictions Occupation;Community Activity;Shop    Stability/Clinical Decision Making Evolving/Moderate complexity    Rehab Potential Good    PT Frequency 2x / week    PT Duration 8 weeks   plus eval   PT Treatment/Interventions DME Instruction;Neuromuscular re-education;Balance training;Therapeutic exercise;Therapeutic activities;Functional mobility training;Stair training;Gait training;Patient/family education;Orthotic Fit/Training;Passive range of motion;Manual techniques    PT Next Visit Plan Core stability/abdominal activation (try on therapy ball), continue to work on strengthening, balance, gait and stair training    PT Home Exercise Plan Access Code: [email or web address]    Consulted and Agree with Plan of Care Patient             Patient will benefit from skilled therapeutic intervention in order to improve the following deficits and impairments:  Abnormal gait, Difficulty walking, Decreased balance, Decreased mobility, Decreased  strength  Visit Diagnosis: Other abnormalities of gait and mobility  Muscle weakness (generalized)  Unsteadiness on feet  Abnormal posture     Problem List Patient Active Problem List   Diagnosis Date Noted   Physical deconditioning 04/14/2021   Balance disorder 04/14/2021   Shoulder disorder 04/14/2021   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[place]) 03/11/2021   Lymphedema of left upper extremity 12/09/2020   Brain metastases ([place]) 11/01/2019   Thrombocytopenia ([place]) 08/09/2019   Port-A-Cath in place 06/20/2019   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[place]) 03/30/2019   S/P mastectomy, left 12/09/2018   S/P breast reconstruction, left 12/09/2018   S/P radiation therapy 12/09/2018   GERD (gastroesophageal reflux disease) 12/10/2017   Edema 11/09/2017   Sensorineural hearing loss (SNHL) of both ears 11/05/2017   Vitamin D deficiency 01/18/2017   Bipolar I disorder, most recent episode depressed ([place]) 12/08/2016   Adjustment disorder with anxiety 12/06/2016   History of cancer metastatic to brain 07/27/2016   Iron deficiency anemia 06/26/2016   Bone  metastases ([place]) 06/03/2016   Primary cancer of lower-inner quadrant of left female breast ([place]) 06/01/2016   Tobacco abuse 07/22/2015   Alcoholism in remission ([place]) 07/22/2015   Malignant neoplasm of female breast ([place]) 07/01/2015   Current smoker 03/28/2015   Goals of care, counseling/discussion 03/28/2015   Seasonal allergies 03/28/2015   Anxiety state 02/28/2015   Fibromyalgia 02/28/2015   Family history of diabetes mellitus 02/28/2015   H/O alcohol abuse     [place], [place], [place] [address] [place], [place] [address] [phone] 05/20/21, 10:49 AM   Name: [name] MRN: [id number] Date of Birth: 09-Jun-1961

## 2021-05-22 ENCOUNTER — Ambulatory Visit: Payer: Medicaid Other | Admitting: Physician Assistant

## 2021-05-22 ENCOUNTER — Ambulatory Visit: Payer: Medicaid Other | Admitting: Physical Therapy

## 2021-05-22 ENCOUNTER — Other Ambulatory Visit: Payer: Self-pay

## 2021-05-22 ENCOUNTER — Encounter: Payer: Self-pay | Admitting: Physical Therapy

## 2021-05-22 DIAGNOSIS — R2681 Unsteadiness on feet: Secondary | ICD-10-CM

## 2021-05-22 DIAGNOSIS — R293 Abnormal posture: Secondary | ICD-10-CM

## 2021-05-22 DIAGNOSIS — Z5112 Encounter for antineoplastic immunotherapy: Secondary | ICD-10-CM | POA: Diagnosis not present

## 2021-05-22 DIAGNOSIS — M6281 Muscle weakness (generalized): Secondary | ICD-10-CM

## 2021-05-22 DIAGNOSIS — R2689 Other abnormalities of gait and mobility: Secondary | ICD-10-CM

## 2021-05-23 NOTE — Therapy (Signed)
[facility] [address] [place], [place], [address] Phone: [phone]   Fax:  [phone]  Physical Therapy Treatment  Patient Details  Name: [name] MRN: [id number] Date of Birth: [birth year] Referring Provider (PT): Magrinat, Sarajane Jews   Encounter Date: 05/22/2021   PT End of Session - 05/22/21 0855     Visit Number 5    Number of Visits 17    Authorization Type Medicaid    Authorization Time Period 12 visits from 04/30/21-06/10/21    Authorization - Visit Number 4    Authorization - Number of Visits 12    PT Start Time 0850    PT Stop Time 0930    PT Time Calculation (min) 40 min    Equipment Utilized During Treatment Gait belt    Activity Tolerance Patient tolerated treatment well;Patient limited by fatigue    Behavior During Therapy [facility] for tasks assessed/performed;Impulsive             Past Medical History:  Diagnosis Date   Alcohol abuse    none since 2013   Anemia    during chemo   Anxiety    At age 60   Arthritis Dx 2010   Bipolar disorder ([place])    Bronchitis    Cancer ([place])    breast mets to brain   CHF (congestive heart failure) (HCC)    Chronic pain    resolved per patient 9/37/90   Complication of anesthesia    Depression    Family history of adverse reaction to anesthesia    MOther had PONV   GERD (gastroesophageal reflux disease)    Headache    hx  migraines   HLD (hyperlipidemia)    Hypertension    Lymphedema of left arm    Opiate dependence ([place])    PONV (postoperative nausea and vomiting)    Port-A-Cath in place    PTSD (post-traumatic stress disorder)    S/P endometrial ablation    in md's office    Past Surgical History:  Procedure Laterality Date   APPLICATION OF CRANIAL NAVIGATION N/A 08/14/2016   Procedure: APPLICATION OF CRANIAL NAVIGATION;  Surgeon: Erline Levine, MD;  Location: [facility];  Service: Neurosurgery;  Laterality: N/A;   BREAST RECONSTRUCTION Left     with silicone implant   COLONOSCOPY W/ POLYPECTOMY     CRANIOTOMY N/A 08/14/2016   Procedure: CRANIOTOMY TUMOR EXCISION WITH Lucky Rathke;  Surgeon: Erline Levine, MD;  Location: [facility];  Service: Neurosurgery;  Laterality: N/A;   FIBULA FRACTURE SURGERY Left    MASTECTOMY Left    RADIOLOGY WITH ANESTHESIA N/A 07/23/2016   Procedure: MRI OF BRAIN WITH AND WITHOUT;  Surgeon: Medication Radiologist, MD;  Location: [facility];  Service: Radiology;  Laterality: N/A;   RADIOLOGY WITH ANESTHESIA N/A 09/08/2016   Procedure: MRI OF BRAIN WITH AND WITHOUT CONTRAST;  Surgeon: Medication Radiologist, MD;  Location: [facility];  Service: Radiology;  Laterality: N/A;   RADIOLOGY WITH ANESTHESIA N/A 12/10/2016   Procedure: MRI OF BRAIN WITH AND WITHOUT;  Surgeon: Medication Radiologist, MD;  Location: [facility];  Service: Radiology;  Laterality: N/A;   RADIOLOGY WITH ANESTHESIA N/A 03/02/2017   Procedure: MRI of BRAIN W and W/OUT CONTRAST;  Surgeon: Medication Radiologist, MD;  Location: [facility];  Service: Radiology;  Laterality: N/A;   RADIOLOGY WITH ANESTHESIA N/A 07/29/2017   Procedure: RADIOLOGY WITH ANESTHESIA MRI OF BRAIN WITH AND WITHOUT CONTRAST;  Surgeon: Radiologist, Medication, MD;  Location: [facility];  Service: Radiology;  Laterality: N/A;   RADIOLOGY WITH ANESTHESIA N/A 12/07/2017   Procedure: MRI WITH ANESTHESIA OF BRAIN WITH AND WITHOUT CONTRAST;  Surgeon: Radiologist, Medication, MD;  Location: [facility];  Service: Radiology;  Laterality: N/A;   RADIOLOGY WITH ANESTHESIA N/A 04/07/2018   Procedure: MRI OF BRAIN WITH AND WITHOUT CONTRAST;  Surgeon: Radiologist, Medication, MD;  Location: [facility];  Service: Radiology;  Laterality: N/A;   RADIOLOGY WITH ANESTHESIA N/A 08/23/2018   Procedure: MRI WITH ANESTHESIA OF THE BRAIN WITH AND WITHOUT;  Surgeon: Radiologist, Medication, MD;  Location: [facility];  Service: Radiology;  Laterality: N/A;   RADIOLOGY WITH ANESTHESIA N/A 01/24/2019   Procedure: MRI OF BRAIN WITH AND WITHOUT  CONTRAST;  Surgeon: Radiologist, Medication, MD;  Location: [facility];  Service: Radiology;  Laterality: N/A;   RADIOLOGY WITH ANESTHESIA N/A 07/06/2019   Procedure: MRI WITH ANESTHESIA OF BRAIN WITH AND WITHOUT CONTRAST;  Surgeon: Radiologist, Medication, MD;  Location: [facility];  Service: Radiology;  Laterality: N/A;   RADIOLOGY WITH ANESTHESIA N/A 01/11/2020   Procedure: MRI WITH ANESTHESIA BRAIN WITH AND WITHOUT;  Surgeon: Radiologist, Medication, MD;  Location: [facility];  Service: Radiology;  Laterality: N/A;   RADIOLOGY WITH ANESTHESIA N/A 05/21/2020   Procedure: MRI WITH ANESTHESIA OF BRAIN WITH AND WITHOUT CONTRAST;  Surgeon: Radiologist, Medication, MD;  Location: [facility];  Service: Radiology;  Laterality: N/A;   RADIOLOGY WITH ANESTHESIA N/A 01/02/2021   Procedure: MRI WITH ANESTHESIA  BRAIN WITH AND WITHOUT CONTRAST,TOTAL SPINE MET SCREENING;  Surgeon: Radiologist, Medication, MD;  Location: [facility];  Service: Radiology;  Laterality: N/A;   right power port placement Right     There were no vitals filed for this visit.   Subjective Assessment - 05/22/21 0853     Subjective No new complaints. No falls. One near fall this am when getting out of shower. Her foot slipped when stepping out, was able to self correct/catch balance due to hanging onto wall.    Pertinent History PMH: breast cancer with mets to brain/lymph node, alcohol abuse (none since 2013), anemia (during chemo), anxiety, arthritis, bipoloar, bronchitis, headaches (hx of migraines), lymphedema of left arm, PTSD    Limitations Sitting;Standing;Walking    Patient Stated Goals Pt's goals for PT are to get back on my feet more steady.    Currently in Pain? No/denies    Pain Score 0-No pain                  [facility] Adult PT Treatment/Exercise - 05/22/21 0856       Transfers   Transfers Sit to Stand;Stand to Sit    Sit to Stand 5: Supervision;Without upper extremity assist;From chair/3-in-1    Stand to Sit 5: Supervision;Without upper  extremity assist;To chair/3-in-1      Ambulation/Gait   Ambulation/Gait Yes    Ambulation/Gait Assistance 5: Supervision;4: Min guard    Ambulation/Gait Assistance Details around clinic with session    Assistive device Rollator    Gait Pattern Step-through pattern;Decreased arm swing - right;Decreased arm swing - left;Decreased step length - right;Decreased step length - left;Poor foot clearance - left;Narrow base of support;Shuffle;Poor foot clearance - right    Ambulation Surface Level;Indoor      Neuro Re-ed    Neuro Re-ed Details  standing with rollator next to mat table with feet hip width apart: no UE support for EC 30 sec's x 3 reps, progressing to EC head movements left<>right, up<>down for ~10 reps each. min guard to min  assist with cues for posture, weight shifting to assist with balance.      Exercises   Exercises Other Exercises    Other Exercises  seated on small blue pball: single UE raises, then bil UE raises for 6-8 reps each. Then with single UE support on mat table- heel<>toe raises, long arc quads, alternating marching for ~10 reps each, then contralateral UE/LE raises for 10 reps each side. cues on form/technique with assist to stabilize ball needed at times.      Knee/Hip Exercises: Aerobic   Nustep UE/LE's level 3 for 4 minutes, rest, then 4 additional minutes with goal >/= 35 steps per minute for strengthening and activity tolerance.                      PT Short Term Goals - 04/18/21 1319       PT SHORT TERM GOAL #1   Title Pt will be independent with HEP for strength, gait, and balance.  TARGET 05/23/2021    Baseline not currently performing HEP-pt lost previous HEP packet    Time 4    Period Weeks    Status New      PT SHORT TERM GOAL #2   Title Pt will decrease 5 x sit to stand from 20.44 sec to <15 sec for improved balance and functional strength.    Baseline 20.44 sec no UE support    Time 4    Period Weeks    Status New      PT SHORT  TERM GOAL #3   Title Pt will improve TUG score to less than or equal to 15 seconds for decreased fall risk.    Baseline TUG 19.19 sec    Time 4    Period Weeks    Status New      PT SHORT TERM GOAL #4   Title Pt will verbalize understanding of fall prevention education.    Baseline Pt at fall risk per TUG and DGI scores    Time 4    Period Weeks    Status New      PT SHORT TERM GOAL #5   Title Pt will improve DGI score to at least 9/24 for imrpoved balance without device in short distances.    Baseline 5/24    Time 4    Period Weeks    Status New               PT Long Term Goals - 04/18/21 1323       PT LONG TERM GOAL #1   Title Pt will be independent with progression of HEP for improved balance, strength, gait.  TARGET 06/20/21    Baseline not currently performing an HEP    Time 8    Period Weeks    Status New      PT LONG TERM GOAL #2   Title Pt will improve gait velocity to at least 2.62 ft/sec for improved gait efficiency and safety.    Baseline 2.07 ft/sec at eval (no device)    Time 8    Period Weeks    Status New      PT LONG TERM GOAL #3   Title Pt will improve TUG score to less than or equal to 13.5 sec for decreased fall risk.    Baseline 19.19 sec    Time 8    Period Weeks    Status New      PT LONG TERM GOAL #4  Title Pt will ambulate up/down 4 steps with bilateral rails supervision for improved safety with household access.    Baseline supervision/min guard, decreased foot clearance.    Time 8    Period Weeks    Status New      PT LONG TERM GOAL #5   Title Pt will ambulate at least 1000 ft indoor and outdoor surfaces, using rollator, mod I, for improved community gait.    Baseline supervision/min guard with no device short distance gait    Time 8    Period Weeks    Status New                   Plan - 05/22/21 0855     Clinical Impression Statement Today's skilled session continued to focus on strengthening and activity  tolerance with no issues noted or reported in session. The pt is making slow, steady progress toward goals and should benefit from continued PT to progress toward unmet goals.    Personal Factors and Comorbidities Comorbidity 3+;Behavior Pattern    Comorbidities See above    Examination-Activity Limitations Locomotion Level;Transfers;Stairs;Stand    Examination-Participation Restrictions Occupation;Community Activity;Shop    Stability/Clinical Decision Making Evolving/Moderate complexity    Rehab Potential Good    PT Frequency 2x / week    PT Duration 8 weeks   plus eval   PT Treatment/Interventions DME Instruction;Neuromuscular re-education;Balance training;Therapeutic exercise;Therapeutic activities;Functional mobility training;Stair training;Gait training;Patient/family education;Orthotic Fit/Training;Passive range of motion;Manual techniques    PT Next Visit Plan Core stability/abdominal activation- continue with pball, continue to work on strengthening, balance, gait and stair training    PT Home Exercise Plan Access Code: [email or web address]    Consulted and Agree with Plan of Care Patient             Patient will benefit from skilled therapeutic intervention in order to improve the following deficits and impairments:  Abnormal gait, Difficulty walking, Decreased balance, Decreased mobility, Decreased strength  Visit Diagnosis: Other abnormalities of gait and mobility  Muscle weakness (generalized)  Unsteadiness on feet  Abnormal posture     Problem List Patient Active Problem List   Diagnosis Date Noted   Physical deconditioning 04/14/2021   Balance disorder 04/14/2021   Shoulder disorder 04/14/2021   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   Lymphedema of left upper extremity 12/09/2020   Brain metastases ([place]) 11/01/2019   Thrombocytopenia ([place]) 08/09/2019   Port-A-Cath in place 06/20/2019   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[place]) 03/30/2019   S/P  mastectomy, left 12/09/2018   S/P breast reconstruction, left 12/09/2018   S/P radiation therapy 12/09/2018   GERD (gastroesophageal reflux disease) 12/10/2017   Edema 11/09/2017   Sensorineural hearing loss (SNHL) of both ears 11/05/2017   Vitamin D deficiency 01/18/2017   Bipolar I disorder, most recent episode depressed ([place]) 12/08/2016   Adjustment disorder with anxiety 12/06/2016   History of cancer metastatic to brain 07/27/2016   Iron deficiency anemia 06/26/2016   Bone metastases ([place]) 06/03/2016   Primary cancer of lower-inner quadrant of left female breast ([place]) 06/01/2016   Tobacco abuse 07/22/2015   Alcoholism in remission ([place]) 07/22/2015   Malignant neoplasm of female breast ([place]) 07/01/2015   Current smoker 03/28/2015   Goals of care, counseling/discussion 03/28/2015   Seasonal allergies 03/28/2015   Anxiety state 02/28/2015   Fibromyalgia 02/28/2015   Family history of diabetes mellitus 02/28/2015   H/O alcohol abuse     [place], [place], [place]  [address] [place], [place] [address] [phone] 05/23/21, 4:30 PM   Name: Raevyn Sokol MRN: [id number] Date of Birth: [birth year]

## 2021-05-26 ENCOUNTER — Other Ambulatory Visit: Payer: Self-pay | Admitting: Adult Health

## 2021-05-26 ENCOUNTER — Other Ambulatory Visit: Payer: Self-pay | Admitting: Oncology

## 2021-05-26 DIAGNOSIS — C50011 Malignant neoplasm of nipple and areola, right female breast: Secondary | ICD-10-CM

## 2021-05-26 DIAGNOSIS — H6993 Unspecified Eustachian tube disorder, bilateral: Secondary | ICD-10-CM

## 2021-05-26 DIAGNOSIS — C7931 Secondary malignant neoplasm of brain: Secondary | ICD-10-CM

## 2021-05-26 DIAGNOSIS — C50012 Malignant neoplasm of nipple and areola, left female breast: Secondary | ICD-10-CM

## 2021-05-26 DIAGNOSIS — H6983 Other specified disorders of Eustachian tube, bilateral: Secondary | ICD-10-CM

## 2021-05-27 ENCOUNTER — Ambulatory Visit: Payer: Medicaid Other | Admitting: Physical Therapy

## 2021-05-29 ENCOUNTER — Other Ambulatory Visit: Payer: Medicaid Other

## 2021-05-29 ENCOUNTER — Ambulatory Visit: Payer: Medicaid Other

## 2021-05-29 ENCOUNTER — Ambulatory Visit: Payer: Medicaid Other | Admitting: Physical Therapy

## 2021-05-30 ENCOUNTER — Other Ambulatory Visit: Payer: Self-pay

## 2021-05-30 ENCOUNTER — Ambulatory Visit: Payer: Medicaid Other | Attending: Family Medicine | Admitting: Physical Therapy

## 2021-05-30 DIAGNOSIS — R293 Abnormal posture: Secondary | ICD-10-CM | POA: Insufficient documentation

## 2021-05-30 DIAGNOSIS — R2689 Other abnormalities of gait and mobility: Secondary | ICD-10-CM

## 2021-05-30 DIAGNOSIS — M6281 Muscle weakness (generalized): Secondary | ICD-10-CM | POA: Insufficient documentation

## 2021-05-30 DIAGNOSIS — R2681 Unsteadiness on feet: Secondary | ICD-10-CM

## 2021-05-30 NOTE — Therapy (Signed)
[facility] [address] [place], [place], [address] Phone: [phone]   Fax:  [phone]  Physical Therapy Treatment  Patient Details  Name: [name] MRN: [id number] Date of Birth: 14-Apr-1961 Referring Provider (PT): Magrinat, Sarajane Jews   Encounter Date: 05/30/2021   PT End of Session - 05/30/21 0927     Visit Number 6    Number of Visits 17    Authorization Type Medicaid    Authorization Time Period 12 visits from 04/30/21-06/10/21    Authorization - Visit Number 5    Authorization - Number of Visits 12    PT Start Time [phone]    PT Stop Time 0930    PT Time Calculation (min) 38 min    Equipment Utilized During Treatment Gait belt    Activity Tolerance Patient tolerated treatment well;Patient limited by fatigue    Behavior During Therapy [facility] for tasks assessed/performed;Impulsive             Past Medical History:  Diagnosis Date   Alcohol abuse    none since 2013   Anemia    during chemo   Anxiety    At age 60   Arthritis Dx 2010   Bipolar disorder ([place])    Bronchitis    Cancer ([place])    breast mets to brain   CHF (congestive heart failure) (HCC)    Chronic pain    resolved per patient 8/85/02   Complication of anesthesia    Depression    Family history of adverse reaction to anesthesia    MOther had PONV   GERD (gastroesophageal reflux disease)    Headache    hx  migraines   HLD (hyperlipidemia)    Hypertension    Lymphedema of left arm    Opiate dependence ([place])    PONV (postoperative nausea and vomiting)    Port-A-Cath in place    PTSD (post-traumatic stress disorder)    S/P endometrial ablation    in md's office    Past Surgical History:  Procedure Laterality Date   APPLICATION OF CRANIAL NAVIGATION N/A 08/14/2016   Procedure: APPLICATION OF CRANIAL NAVIGATION;  Surgeon: Erline Levine, MD;  Location: [facility];  Service: Neurosurgery;  Laterality: N/A;   BREAST RECONSTRUCTION Left     with silicone implant   COLONOSCOPY W/ POLYPECTOMY     CRANIOTOMY N/A 08/14/2016   Procedure: CRANIOTOMY TUMOR EXCISION WITH Lucky Rathke;  Surgeon: Erline Levine, MD;  Location: [facility];  Service: Neurosurgery;  Laterality: N/A;   FIBULA FRACTURE SURGERY Left    MASTECTOMY Left    RADIOLOGY WITH ANESTHESIA N/A 07/23/2016   Procedure: MRI OF BRAIN WITH AND WITHOUT;  Surgeon: Medication Radiologist, MD;  Location: [facility];  Service: Radiology;  Laterality: N/A;   RADIOLOGY WITH ANESTHESIA N/A 09/08/2016   Procedure: MRI OF BRAIN WITH AND WITHOUT CONTRAST;  Surgeon: Medication Radiologist, MD;  Location: [facility];  Service: Radiology;  Laterality: N/A;   RADIOLOGY WITH ANESTHESIA N/A 12/10/2016   Procedure: MRI OF BRAIN WITH AND WITHOUT;  Surgeon: Medication Radiologist, MD;  Location: [facility];  Service: Radiology;  Laterality: N/A;   RADIOLOGY WITH ANESTHESIA N/A 03/02/2017   Procedure: MRI of BRAIN W and W/OUT CONTRAST;  Surgeon: Medication Radiologist, MD;  Location: [facility];  Service: Radiology;  Laterality: N/A;   RADIOLOGY WITH ANESTHESIA N/A 07/29/2017   Procedure: RADIOLOGY WITH ANESTHESIA MRI OF BRAIN WITH AND WITHOUT CONTRAST;  Surgeon: Radiologist, Medication, MD;  Location: [facility];  Service: Radiology;  Laterality: N/A;   RADIOLOGY WITH ANESTHESIA N/A 12/07/2017   Procedure: MRI WITH ANESTHESIA OF BRAIN WITH AND WITHOUT CONTRAST;  Surgeon: Radiologist, Medication, MD;  Location: [facility];  Service: Radiology;  Laterality: N/A;   RADIOLOGY WITH ANESTHESIA N/A 04/07/2018   Procedure: MRI OF BRAIN WITH AND WITHOUT CONTRAST;  Surgeon: Radiologist, Medication, MD;  Location: [facility];  Service: Radiology;  Laterality: N/A;   RADIOLOGY WITH ANESTHESIA N/A 08/23/2018   Procedure: MRI WITH ANESTHESIA OF THE BRAIN WITH AND WITHOUT;  Surgeon: Radiologist, Medication, MD;  Location: [facility];  Service: Radiology;  Laterality: N/A;   RADIOLOGY WITH ANESTHESIA N/A 01/24/2019   Procedure: MRI OF BRAIN WITH AND WITHOUT  CONTRAST;  Surgeon: Radiologist, Medication, MD;  Location: [facility];  Service: Radiology;  Laterality: N/A;   RADIOLOGY WITH ANESTHESIA N/A 07/06/2019   Procedure: MRI WITH ANESTHESIA OF BRAIN WITH AND WITHOUT CONTRAST;  Surgeon: Radiologist, Medication, MD;  Location: [facility];  Service: Radiology;  Laterality: N/A;   RADIOLOGY WITH ANESTHESIA N/A 01/11/2020   Procedure: MRI WITH ANESTHESIA BRAIN WITH AND WITHOUT;  Surgeon: Radiologist, Medication, MD;  Location: [facility];  Service: Radiology;  Laterality: N/A;   RADIOLOGY WITH ANESTHESIA N/A 05/21/2020   Procedure: MRI WITH ANESTHESIA OF BRAIN WITH AND WITHOUT CONTRAST;  Surgeon: Radiologist, Medication, MD;  Location: [facility];  Service: Radiology;  Laterality: N/A;   RADIOLOGY WITH ANESTHESIA N/A 01/02/2021   Procedure: MRI WITH ANESTHESIA  BRAIN WITH AND WITHOUT CONTRAST,TOTAL SPINE MET SCREENING;  Surgeon: Radiologist, Medication, MD;  Location: [facility];  Service: Radiology;  Laterality: N/A;   right power port placement Right     There were no vitals filed for this visit.   Subjective Assessment - 05/30/21 0855     Subjective No falls.  Melburn Popper has been messing up and that's why I've missed several appointments.    Pertinent History PMH: breast cancer with mets to brain/lymph node, alcohol abuse (none since 2013), anemia (during chemo), anxiety, arthritis, bipoloar, bronchitis, headaches (hx of migraines), lymphedema of left arm, PTSD    Limitations Sitting;Standing;Walking    Patient Stated Goals Pt's goals for PT are to get back on my feet more steady.    Currently in Pain? No/denies                               [facility] Adult PT Treatment/Exercise - 05/30/21 0001       Transfers   Transfers Sit to Stand;Stand to Sit    Sit to Stand 5: Supervision;Without upper extremity assist;From chair/3-in-1    Five time sit to stand comments  16.29    Stand to Sit 5: Supervision;Without upper extremity assist;To chair/3-in-1    Comments  Standing on Airex, x 10 reps      Ambulation/Gait   Ambulation/Gait Yes    Ambulation/Gait Assistance 5: Supervision;4: Min guard    Ambulation Distance (Feet) 250 Feet    Assistive device Rollator    Gait Pattern Step-through pattern;Decreased arm swing - right;Decreased arm swing - left;Decreased step length - right;Decreased step length - left;Poor foot clearance - left;Narrow base of support;Shuffle;Poor foot clearance - right    Ambulation Surface Level;Indoor    Gait velocity 13.44 sec = 2.44 ft/sec   using rollator   Gait Comments Discussed with patient that she continues to need to use rollator walker when out of the house due to increased fall risk.  Standardized Balance Assessment   Standardized Balance Assessment Timed Up and Go Test;Dynamic Gait Index      Dynamic Gait Index   Level Surface Moderate Impairment   12.87   Change in Gait Speed Mild Impairment    Gait with Horizontal Head Turns Moderate Impairment    Gait with Vertical Head Turns Moderate Impairment    Gait and Pivot Turn Mild Impairment    Step Over Obstacle Severe Impairment    Step Around Obstacles Moderate Impairment    Steps Mild Impairment    Total Score 10      Timed Up and Go Test   TUG Normal TUG    Normal TUG (seconds) 19.62             Access Code: [email or web address] URL: [email or web address] Date: 05/30/2021 Prepared by: Mady Haagensen  Exercises-Reviewed as part of HEP, pt return demo understanding.  Took out seated hip abduction, as pt is not using theraband at home.  Educated pt to increase sets of seated and standing exercises (see below)  Sit to Stand - 1 x daily - 5 x weekly - 1 sets - 10 reps Seated March with Resistance - 1 x daily - 5 x weekly - 3 sets - 10 reps Seated Long Arc Quad - 1 x daily - 5 x weekly - 3 sets - 10 reps - 3 sec hold Heel Toe Raises with Counter Support - 1 x daily - 5 x weekly - 1 sets - 10 reps (work up to 3 sets) Standing Hip Abduction - 1 x  daily - 5 x weekly - 1 sets - 10 reps (work up to 3 sets of 10) Narrow Stance with Counter Support - 1 x daily - 5 x weekly - 1 sets - 3 reps - 20 hold Standing Single Leg Stance with Counter Support - 1 x daily - 5 x weekly - 1 sets - 3 reps - 10 hold       PT Education - 05/30/21 0920     Education Details Reviewed fall prevention education, progress towards goals and POC; updated HEP    Person(s) Educated Patient    Methods Explanation;Demonstration;Handout    Comprehension Verbalized understanding;Returned demonstration              PT Short Term Goals - 05/30/21 0906       PT SHORT TERM GOAL #1   Title Pt will be independent with HEP for strength, gait, and balance.  TARGET 05/23/2021    Baseline not currently performing HEP-pt lost previous HEP packet; 05/30/21:  min cues and performing HEP 4-5x/wk    Time 4    Period Weeks    Status Partially Met      PT SHORT TERM GOAL #2   Title Pt will decrease 5 x sit to stand from 20.44 sec to <15 sec for improved balance and functional strength.    Baseline 20.44 sec no UE support; 16.29    Time 4    Period Weeks    Status Partially Met      PT SHORT TERM GOAL #3   Title Pt will improve TUG score to less than or equal to 15 seconds for decreased fall risk.    Baseline TUG 19.19 sec; 19.62 sec 05/30/21    Time 4    Period Weeks    Status Not Met      PT SHORT TERM GOAL #4   Title Pt will verbalize understanding of fall prevention education.  Baseline Pt at fall risk per TUG and DGI scores    Time 4    Period Weeks    Status Achieved      PT SHORT TERM GOAL #5   Title Pt will improve DGI score to at least 9/24 for imrpoved balance without device in short distances.    Baseline 5/24; 10/24 05/30/21    Time 4    Period Weeks    Status Achieved               PT Long Term Goals - 04/18/21 1323       PT LONG TERM GOAL #1   Title Pt will be independent with progression of HEP for improved balance, strength, gait.   TARGET 06/20/21    Baseline not currently performing an HEP    Time 8    Period Weeks    Status New      PT LONG TERM GOAL #2   Title Pt will improve gait velocity to at least 2.62 ft/sec for improved gait efficiency and safety.    Baseline 2.07 ft/sec at eval (no device)    Time 8    Period Weeks    Status New      PT LONG TERM GOAL #3   Title Pt will improve TUG score to less than or equal to 13.5 sec for decreased fall risk.    Baseline 19.19 sec    Time 8    Period Weeks    Status New      PT LONG TERM GOAL #4   Title Pt will ambulate up/down 4 steps with bilateral rails supervision for improved safety with household access.    Baseline supervision/min guard, decreased foot clearance.    Time 8    Period Weeks    Status New      PT LONG TERM GOAL #5   Title Pt will ambulate at least 1000 ft indoor and outdoor surfaces, using rollator, mod I, for improved community gait.    Baseline supervision/min guard with no device short distance gait    Time 8    Period Weeks    Status New                   Plan - 05/30/21 1200     Clinical Impression Statement Assessed STGs this visit, wtih pt meeting 2 of 5 STGs.  STG 1 partially met for HEP (needs cues), STG partially met for 5x sit<>stand (improved, just not to goal level).  STG 3 not met for TUG score; STG 4 and 5 met for fall prevention and improved DGI score.  Pt remains at fall risk, therefore, focused time on reviewing fall prevention education and educating pt to use rollator at all times when not in her home (she does not use in home due to close, small quarters).  She is making nice progress, despite missing several visits due to transportation issues.  She will continue to bneefit from skilled PT to further address strength and balance for imrpoved overall gait and functional mboility/decreased fall risk.    Personal Factors and Comorbidities Comorbidity 3+;Behavior Pattern    Comorbidities See above     Examination-Activity Limitations Locomotion Level;Transfers;Stairs;Stand    Examination-Participation Restrictions Occupation;Community Activity;Shop    Stability/Clinical Decision Making Evolving/Moderate complexity    Rehab Potential Good    PT Frequency 2x / week    PT Duration 8 weeks   plus eval   PT Treatment/Interventions DME Instruction;Neuromuscular re-education;Balance training;Therapeutic  exercise;Therapeutic activities;Functional mobility training;Stair training;Gait training;Patient/family education;Orthotic Fit/Training;Passive range of motion;Manual techniques    PT Next Visit Plan Core stability/abdominal activation- continue with pball, continue to work on strengthening, balance, gait and stair training, working towards [place].  Auth is up on 7/12, so will neeed to decide about reauth/more visits.    PT Home Exercise Plan Access Code: [email or web address]    Consulted and Agree with Plan of Care Patient             Patient will benefit from skilled therapeutic intervention in order to improve the following deficits and impairments:  Abnormal gait, Difficulty walking, Decreased balance, Decreased mobility, Decreased strength  Visit Diagnosis: Unsteadiness on feet  Muscle weakness (generalized)  Other abnormalities of gait and mobility     Problem List Patient Active Problem List   Diagnosis Date Noted   Physical deconditioning 04/14/2021   Balance disorder 04/14/2021   Shoulder disorder 04/14/2021   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   Lymphedema of left upper extremity 12/09/2020   Brain metastases ([place]) 11/01/2019   Thrombocytopenia ([place]) 08/09/2019   Port-A-Cath in place 06/20/2019   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[place]) 03/30/2019   S/P mastectomy, left 12/09/2018   S/P breast reconstruction, left 12/09/2018   S/P radiation therapy 12/09/2018   GERD (gastroesophageal reflux disease) 12/10/2017   Edema 11/09/2017   Sensorineural hearing  loss (SNHL) of both ears 11/05/2017   Vitamin D deficiency 01/18/2017   Bipolar I disorder, most recent episode depressed ([place]) 12/08/2016   Adjustment disorder with anxiety 12/06/2016   History of cancer metastatic to brain 07/27/2016   Iron deficiency anemia 06/26/2016   Bone metastases ([place]) 06/03/2016   Primary cancer of lower-inner quadrant of left female breast ([place]) 06/01/2016   Tobacco abuse 07/22/2015   Alcoholism in remission ([place]) 07/22/2015   Malignant neoplasm of female breast ([place]) 07/01/2015   Current smoker 03/28/2015   Goals of care, counseling/discussion 03/28/2015   Seasonal allergies 03/28/2015   Anxiety state 02/28/2015   Fibromyalgia 02/28/2015   Family history of diabetes mellitus 02/28/2015   H/O alcohol abuse     Voncile Schwarz W. 05/30/2021, 12:06 PM Frazier Butt., PT  [facility] [address] [place], [place], [address] Phone: [phone]   Fax:  [phone]  Name: Camylle Whicker MRN: [id number] Date of Birth: [birth year]

## 2021-05-30 NOTE — Patient Instructions (Signed)
Access Code: [email or web address] URL: [email or web address] Date: 05/30/2021 Prepared by: Mady Haagensen  Exercises Sit to Stand - 1 x daily - 5 x weekly - 1 sets - 10 reps Seated March with Resistance - 1 x daily - 5 x weekly - 3 sets - 10 reps Seated Long Arc Quad - 1 x daily - 5 x weekly - 3 sets - 10 reps - 3 sec hold Heel Toe Raises with Counter Support - 1 x daily - 5 x weekly - 2-3 sets - 10 reps Standing Hip Abduction - 1 x daily - 5 x weekly - 2-3 sets - 10 reps Narrow Stance with Counter Support - 1 x daily - 5 x weekly - 1 sets - 3 reps - 20 hold Standing Single Leg Stance with Counter Support - 1 x daily - 5 x weekly - 1 sets - 3 reps - 10 hold

## 2021-06-04 ENCOUNTER — Ambulatory Visit: Payer: Medicaid Other | Admitting: Physical Therapy

## 2021-06-04 ENCOUNTER — Other Ambulatory Visit: Payer: Self-pay | Admitting: *Deleted

## 2021-06-04 DIAGNOSIS — C770 Secondary and unspecified malignant neoplasm of lymph nodes of head, face and neck: Secondary | ICD-10-CM

## 2021-06-04 DIAGNOSIS — C50011 Malignant neoplasm of nipple and areola, right female breast: Secondary | ICD-10-CM

## 2021-06-04 DIAGNOSIS — C50012 Malignant neoplasm of nipple and areola, left female breast: Secondary | ICD-10-CM

## 2021-06-05 ENCOUNTER — Other Ambulatory Visit: Payer: Self-pay

## 2021-06-05 ENCOUNTER — Inpatient Hospital Stay: Payer: Medicaid Other | Attending: Medical

## 2021-06-05 ENCOUNTER — Inpatient Hospital Stay: Payer: Medicaid Other

## 2021-06-05 VITALS — BP 114/70 | HR 81 | Temp 98.1°F | Resp 18

## 2021-06-05 DIAGNOSIS — D696 Thrombocytopenia, unspecified: Secondary | ICD-10-CM

## 2021-06-05 DIAGNOSIS — C7951 Secondary malignant neoplasm of bone: Secondary | ICD-10-CM | POA: Insufficient documentation

## 2021-06-05 DIAGNOSIS — C50312 Malignant neoplasm of lower-inner quadrant of left female breast: Secondary | ICD-10-CM | POA: Insufficient documentation

## 2021-06-05 DIAGNOSIS — Z5112 Encounter for antineoplastic immunotherapy: Secondary | ICD-10-CM | POA: Diagnosis present

## 2021-06-05 DIAGNOSIS — C50119 Malignant neoplasm of central portion of unspecified female breast: Secondary | ICD-10-CM

## 2021-06-05 DIAGNOSIS — Z79899 Other long term (current) drug therapy: Secondary | ICD-10-CM | POA: Insufficient documentation

## 2021-06-05 DIAGNOSIS — C770 Secondary and unspecified malignant neoplasm of lymph nodes of head, face and neck: Secondary | ICD-10-CM

## 2021-06-05 DIAGNOSIS — Z95828 Presence of other vascular implants and grafts: Secondary | ICD-10-CM

## 2021-06-05 LAB — CBC WITH DIFFERENTIAL (CANCER CENTER ONLY)
Abs Immature Granulocytes: 0.02 10*3/uL (ref 0.00–0.07)
Basophils Absolute: 0 10*3/uL (ref 0.0–0.1)
Basophils Relative: 0 %
Eosinophils Absolute: 0 10*3/uL (ref 0.0–0.5)
Eosinophils Relative: 0 %
HCT: 31.5 % — ABNORMAL LOW (ref 36.0–46.0)
Hemoglobin: 10.7 g/dL — ABNORMAL LOW (ref 12.0–15.0)
Immature Granulocytes: 0 %
Lymphocytes Relative: 21 %
Lymphs Abs: 0.9 10*3/uL (ref 0.7–4.0)
MCH: 29.7 pg (ref 26.0–34.0)
MCHC: 34 g/dL (ref 30.0–36.0)
MCV: 87.5 fL (ref 80.0–100.0)
Monocytes Absolute: 0.3 10*3/uL (ref 0.1–1.0)
Monocytes Relative: 7 %
Neutro Abs: 3.2 10*3/uL (ref 1.7–7.7)
Neutrophils Relative %: 72 %
Platelet Count: 117 10*3/uL — ABNORMAL LOW (ref 150–400)
RBC: 3.6 MIL/uL — ABNORMAL LOW (ref 3.87–5.11)
RDW: 13 % (ref 11.5–15.5)
WBC Count: 4.5 10*3/uL (ref 4.0–10.5)
nRBC: 0 % (ref 0.0–0.2)

## 2021-06-05 LAB — CMP (CANCER CENTER ONLY)
ALT: 37 U/L (ref 0–44)
AST: 41 U/L (ref 15–41)
Albumin: 3.7 g/dL (ref 3.5–5.0)
Alkaline Phosphatase: 78 U/L (ref 38–126)
Anion gap: 9 (ref 5–15)
BUN: 10 mg/dL (ref 6–20)
CO2: 29 mmol/L (ref 22–32)
Calcium: 9.4 mg/dL (ref 8.9–10.3)
Chloride: 98 mmol/L (ref 98–111)
Creatinine: 0.93 mg/dL (ref 0.44–1.00)
GFR, Estimated: >60 mL/min (ref >60)
Glucose, Bld: 100 mg/dL — ABNORMAL HIGH (ref 70–99)
Potassium: 4.3 mmol/L (ref 3.5–5.1)
Sodium: 136 mmol/L (ref 135–145)
Total Bilirubin: 0.2 mg/dL — ABNORMAL LOW (ref 0.3–1.2)
Total Protein: 6.8 g/dL (ref 6.5–8.1)

## 2021-06-05 MED ORDER — TRASTUZUMAB-DKST CHEMO 150 MG IV SOLR
6.0000 mg/kg | Freq: Once | INTRAVENOUS | Status: AC
  Administered 2021-06-05: 399 mg via INTRAVENOUS
  Filled 2021-06-05: qty 19

## 2021-06-05 MED ORDER — SODIUM CHLORIDE 0.9 % IV SOLN
Freq: Once | INTRAVENOUS | Status: AC
  Filled 2021-06-05: qty 250

## 2021-06-05 MED ORDER — HEPARIN SOD (PORK) LOCK FLUSH 100 UNIT/ML IV SOLN
500.0000 [IU] | Freq: Once | INTRAVENOUS | Status: AC | PRN
  Administered 2021-06-05: 500 [IU]
  Filled 2021-06-05: qty 5

## 2021-06-05 MED ORDER — SODIUM CHLORIDE 0.9% FLUSH
10.0000 mL | INTRAVENOUS | Status: DC | PRN
  Administered 2021-06-05: 10 mL
  Filled 2021-06-05: qty 10

## 2021-06-05 MED ORDER — SODIUM CHLORIDE 0.9% FLUSH
10.0000 mL | INTRAVENOUS | Status: AC | PRN
Start: 2021-06-05 — End: 2021-06-05
  Administered 2021-06-05: 10 mL
  Filled 2021-06-05: qty 10

## 2021-06-05 MED ORDER — ACETAMINOPHEN 325 MG PO TABS
650.0000 mg | ORAL_TABLET | Freq: Once | ORAL | Status: AC
Start: 2021-06-05 — End: 2021-06-05
  Administered 2021-06-05: 650 mg via ORAL

## 2021-06-05 MED ORDER — HEPARIN SOD (PORK) LOCK FLUSH 100 UNIT/ML IV SOLN
250.0000 [IU] | INTRAVENOUS | Status: DC | PRN
Start: 2021-06-05 — End: 2021-06-05
  Filled 2021-06-05: qty 5

## 2021-06-05 MED ORDER — ACETAMINOPHEN 325 MG PO TABS
ORAL_TABLET | ORAL | Status: AC
  Filled 2021-06-05: qty 2

## 2021-06-05 NOTE — Patient Instructions (Signed)
[facility]  Discharge Instructions: Thank you for choosing [facility] to provide your oncology and hematology care.   If you have a lab appointment with the Cancer Center, please go directly to the Cancer Center and check in at the registration area.   Wear comfortable clothing and clothing appropriate for easy access to any Portacath or PICC line.   We strive to give you quality time with your provider. You may need to reschedule your appointment if you arrive late (15 or more minutes).  Arriving late affects you and other patients whose appointments are after yours.  Also, if you miss three or more appointments without notifying the office, you may be dismissed from the clinic at the provider's discretion.      For prescription refill requests, have your pharmacy contact our office and allow 72 hours for refills to be completed.    Today you received the following chemotherapy and/or immunotherapy agents trastuzumab      To help prevent nausea and vomiting after your treatment, we encourage you to take your nausea medication as directed.  BELOW ARE SYMPTOMS THAT SHOULD BE REPORTED IMMEDIATELY: *FEVER GREATER THAN 100.4 F (38 C) OR HIGHER *CHILLS OR SWEATING *NAUSEA AND VOMITING THAT IS NOT CONTROLLED WITH YOUR NAUSEA MEDICATION *UNUSUAL SHORTNESS OF BREATH *UNUSUAL BRUISING OR BLEEDING *URINARY PROBLEMS (pain or burning when urinating, or frequent urination) *BOWEL PROBLEMS (unusual diarrhea, constipation, pain near the anus) TENDERNESS IN MOUTH AND THROAT WITH OR WITHOUT PRESENCE OF ULCERS (sore throat, sores in mouth, or a toothache) UNUSUAL RASH, SWELLING OR PAIN  UNUSUAL VAGINAL DISCHARGE OR ITCHING   Items with * indicate a potential emergency and should be followed up as soon as possible or go to the Emergency Department if any problems should occur.  Please show the CHEMOTHERAPY ALERT CARD or IMMUNOTHERAPY ALERT CARD at check-in to  the Emergency Department and triage nurse.  Should you have questions after your visit or need to cancel or reschedule your appointment, please contact [facility]  Dept: [phone]  and follow the prompts.  Office hours are 8:00 a.m. to 4:30 p.m. Monday - Friday. Please note that voicemails left after 4:00 p.m. may not be returned until the following business day.  We are closed weekends and major holidays. You have access to a nurse at all times for urgent questions. Please call the main number to the clinic Dept: [phone] and follow the prompts.   For any non-urgent questions, you may also contact your provider using MyChart. We now offer e-Visits for anyone 18 and older to request care online for non-urgent symptoms. For details visit [email or web address].   Also download the MyChart app! Go to the app store, search "MyChart", open the app, select [facility], and log in with your MyChart username and password.  Due to Covid, a mask is required upon entering the hospital/clinic. If you do not have a mask, one will be given to you upon arrival. For doctor visits, patients may have 1 support person aged 18 or older with them. For treatment visits, patients cannot have anyone with them due to current Covid guidelines and our immunocompromised population.   

## 2021-06-09 ENCOUNTER — Other Ambulatory Visit: Payer: Self-pay | Admitting: Family Medicine

## 2021-06-09 ENCOUNTER — Ambulatory Visit: Payer: Medicaid Other | Admitting: Physical Therapy

## 2021-06-09 ENCOUNTER — Other Ambulatory Visit: Payer: Self-pay

## 2021-06-09 ENCOUNTER — Other Ambulatory Visit: Payer: Self-pay | Admitting: Adult Health

## 2021-06-09 DIAGNOSIS — K219 Gastro-esophageal reflux disease without esophagitis: Secondary | ICD-10-CM

## 2021-06-09 DIAGNOSIS — J302 Other seasonal allergic rhinitis: Secondary | ICD-10-CM

## 2021-06-09 DIAGNOSIS — R2681 Unsteadiness on feet: Secondary | ICD-10-CM | POA: Diagnosis not present

## 2021-06-09 DIAGNOSIS — R2689 Other abnormalities of gait and mobility: Secondary | ICD-10-CM

## 2021-06-09 NOTE — Telephone Encounter (Signed)
Requested medication (s) are due for refill today: Early  Requested medication (s) are on the active medication list: Yes  Last refill:  02/27/21  Future visit scheduled: No  Notes to clinic:  See request.    Requested Prescriptions  Pending Prescriptions Disp Refills   cyclobenzaprine (FLEXERIL) 10 MG tablet [Pharmacy Med Name: CYCLOBENZAPRINE 10 MG TABLET] 180 tablet 1    Sig: TAKE 1 TABLET BY MOUTH TWICE A DAY      Not Delegated - Analgesics:  Muscle Relaxants Failed - 06/09/2021  6:39 AM      Failed - This refill cannot be delegated      Passed - Valid encounter within last 6 months    Recent Outpatient Visits           3 months ago Annual physical exam   [place], Enobong, MD   1 year ago Acute non-recurrent sinusitis of other sinus   [place], Enobong, MD   1 year ago Other constipation   [place], Enobong, MD   2 years ago Metastatic breast cancer [facility])   [place], Enobong, MD   2 years ago Seasonal allergic rhinitis, unspecified trigger   [place], MD       Future Appointments             In 2 months Charlott Rakes, MD [place]

## 2021-06-09 NOTE — Therapy (Signed)
[facility] [address] [place], [place], [address] Phone: [phone]   Fax:  [phone]  Physical Therapy Treatment  Patient Details  Name: [name] MRN: [id number] Date of Birth: [birth year] Referring Provider (PT): Magrinat, Sarajane Jews   Encounter Date: 06/09/2021   PT End of Session - 06/09/21 0817     Visit Number 7    Number of Visits 17    Authorization Type Medicaid    Authorization Time Period 12 visits from 04/30/21-06/10/21    Authorization - Visit Number 6    Authorization - Number of Visits 12    PT Start Time 8786   Pt arrives late   PT Stop Time 0845    PT Time Calculation (min) 28 min    Equipment Utilized During Treatment Gait belt    Activity Tolerance Patient tolerated treatment well;Patient limited by fatigue    Behavior During Therapy [facility] for tasks assessed/performed;Impulsive             Past Medical History:  Diagnosis Date   Alcohol abuse    none since 2013   Anemia    during chemo   Anxiety    At age 22   Arthritis Dx 2010   Bipolar disorder ([place])    Bronchitis    Cancer ([place])    breast mets to brain   CHF (congestive heart failure) (HCC)    Chronic pain    resolved per patient 7/67/20   Complication of anesthesia    Depression    Family history of adverse reaction to anesthesia    MOther had PONV   GERD (gastroesophageal reflux disease)    Headache    hx  migraines   HLD (hyperlipidemia)    Hypertension    Lymphedema of left arm    Opiate dependence ([place])    PONV (postoperative nausea and vomiting)    Port-A-Cath in place    PTSD (post-traumatic stress disorder)    S/P endometrial ablation    in md's office    Past Surgical History:  Procedure Laterality Date   APPLICATION OF CRANIAL NAVIGATION N/A 08/14/2016   Procedure: APPLICATION OF CRANIAL NAVIGATION;  Surgeon: Erline Levine, MD;  Location: [facility];  Service: Neurosurgery;  Laterality: N/A;   BREAST  RECONSTRUCTION Left    with silicone implant   COLONOSCOPY W/ POLYPECTOMY     CRANIOTOMY N/A 08/14/2016   Procedure: CRANIOTOMY TUMOR EXCISION WITH Lucky Rathke;  Surgeon: Erline Levine, MD;  Location: [facility];  Service: Neurosurgery;  Laterality: N/A;   FIBULA FRACTURE SURGERY Left    MASTECTOMY Left    RADIOLOGY WITH ANESTHESIA N/A 07/23/2016   Procedure: MRI OF BRAIN WITH AND WITHOUT;  Surgeon: Medication Radiologist, MD;  Location: [facility];  Service: Radiology;  Laterality: N/A;   RADIOLOGY WITH ANESTHESIA N/A 09/08/2016   Procedure: MRI OF BRAIN WITH AND WITHOUT CONTRAST;  Surgeon: Medication Radiologist, MD;  Location: [facility];  Service: Radiology;  Laterality: N/A;   RADIOLOGY WITH ANESTHESIA N/A 12/10/2016   Procedure: MRI OF BRAIN WITH AND WITHOUT;  Surgeon: Medication Radiologist, MD;  Location: [facility];  Service: Radiology;  Laterality: N/A;   RADIOLOGY WITH ANESTHESIA N/A 03/02/2017   Procedure: MRI of BRAIN W and W/OUT CONTRAST;  Surgeon: Medication Radiologist, MD;  Location: [facility];  Service: Radiology;  Laterality: N/A;   RADIOLOGY WITH ANESTHESIA N/A 07/29/2017   Procedure: RADIOLOGY WITH ANESTHESIA MRI OF BRAIN WITH AND WITHOUT CONTRAST;  Surgeon: Radiologist, Medication, MD;  Location: [facility];  Service: Radiology;  Laterality: N/A;   RADIOLOGY WITH ANESTHESIA N/A 12/07/2017   Procedure: MRI WITH ANESTHESIA OF BRAIN WITH AND WITHOUT CONTRAST;  Surgeon: Radiologist, Medication, MD;  Location: [facility];  Service: Radiology;  Laterality: N/A;   RADIOLOGY WITH ANESTHESIA N/A 04/07/2018   Procedure: MRI OF BRAIN WITH AND WITHOUT CONTRAST;  Surgeon: Radiologist, Medication, MD;  Location: [facility];  Service: Radiology;  Laterality: N/A;   RADIOLOGY WITH ANESTHESIA N/A 08/23/2018   Procedure: MRI WITH ANESTHESIA OF THE BRAIN WITH AND WITHOUT;  Surgeon: Radiologist, Medication, MD;  Location: [facility];  Service: Radiology;  Laterality: N/A;   RADIOLOGY WITH ANESTHESIA N/A 01/24/2019   Procedure: MRI OF BRAIN  WITH AND WITHOUT CONTRAST;  Surgeon: Radiologist, Medication, MD;  Location: [facility];  Service: Radiology;  Laterality: N/A;   RADIOLOGY WITH ANESTHESIA N/A 07/06/2019   Procedure: MRI WITH ANESTHESIA OF BRAIN WITH AND WITHOUT CONTRAST;  Surgeon: Radiologist, Medication, MD;  Location: [facility];  Service: Radiology;  Laterality: N/A;   RADIOLOGY WITH ANESTHESIA N/A 01/11/2020   Procedure: MRI WITH ANESTHESIA BRAIN WITH AND WITHOUT;  Surgeon: Radiologist, Medication, MD;  Location: [facility];  Service: Radiology;  Laterality: N/A;   RADIOLOGY WITH ANESTHESIA N/A 05/21/2020   Procedure: MRI WITH ANESTHESIA OF BRAIN WITH AND WITHOUT CONTRAST;  Surgeon: Radiologist, Medication, MD;  Location: [facility];  Service: Radiology;  Laterality: N/A;   RADIOLOGY WITH ANESTHESIA N/A 01/02/2021   Procedure: MRI WITH ANESTHESIA  BRAIN WITH AND WITHOUT CONTRAST,TOTAL SPINE MET SCREENING;  Surgeon: Radiologist, Medication, MD;  Location: [facility];  Service: Radiology;  Laterality: N/A;   right power port placement Right     There were no vitals filed for this visit.   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  No falls.  PT reminded pt of appointment time tomorrow    Pertinent History PMH: breast cancer with mets to brain/lymph node, alcohol abuse (none since 2013), anemia (during chemo), anxiety, arthritis, bipoloar, bronchitis, headaches (hx of migraines), lymphedema of left arm, PTSD    Limitations Sitting;Standing;Walking    Patient Stated Goals Pt's goals for PT are to get back on my feet more steady.    Currently in Pain? No/denies                               [facility] Adult PT Treatment/Exercise - 06/09/21 0001       Transfers   Transfers Sit to Stand;Stand to Sit    Sit to Stand 5: Supervision;Without upper extremity assist;From chair/3-in-1    Stand to Sit 5: Supervision;Without upper extremity assist;To chair/3-in-1       Ambulation/Gait   Ambulation/Gait Yes    Ambulation/Gait Assistance 5: Supervision;4: Min guard    Ambulation/Gait Assistance Details Pt has one epioside of veering to R and one episode of veering to L with gait with head turns.    Ambulation Distance (Feet) 300 Feet   then 480 ft with rollator   Assistive device Rollator    Gait Pattern Step-through pattern;Decreased arm swing - right;Decreased arm swing - left;Decreased step length - right;Decreased step length - left;Poor foot clearance - left;Narrow base of support;Shuffle;Poor foot clearance - right    Ambulation Surface Level;Indoor    Gait velocity 14.07 sec = 2.33 ft/sec with rollator   at best, with cues for heelstrike  Pre-Gait Activities Figure-8 turns around cones, with rollator, cues to assist to slow pace of rollator and cues for long strides.    Gait Comments Cues for increased step length, heelstrike, which pt is able to do on straightaways with gait.  With curves, pt reverts to shuffle steps.  Cues to stop during gait x 1 for brief standing rest break.      Timed Up and Go Test   TUG Normal TUG    Normal TUG (seconds) 20.31   21.94, 23 sec with rollator; difficulty with hand placement/sequence with transfers.           Extra time and cues for TUG trials, due to safety concerns with turning to sit and with hand placement.          PT Short Term Goals - 05/30/21 0906       PT SHORT TERM GOAL #1   Title Pt will be independent with HEP for strength, gait, and balance.  TARGET 05/23/2021    Baseline not currently performing HEP-pt lost previous HEP packet; 05/30/21:  min cues and performing HEP 4-5x/wk    Time 4    Period Weeks    Status Partially Met      PT SHORT TERM GOAL #2   Title Pt will decrease 5 x sit to stand from 20.44 sec to <15 sec for improved balance and functional strength.    Baseline 20.44 sec no UE support; 16.29    Time 4    Period Weeks    Status Partially Met      PT SHORT TERM GOAL #3    Title Pt will improve TUG score to less than or equal to 15 seconds for decreased fall risk.    Baseline TUG 19.19 sec; 19.62 sec 05/30/21    Time 4    Period Weeks    Status Not Met      PT SHORT TERM GOAL #4   Title Pt will verbalize understanding of fall prevention education.    Baseline Pt at fall risk per TUG and DGI scores    Time 4    Period Weeks    Status Achieved      PT SHORT TERM GOAL #5   Title Pt will improve DGI score to at least 9/24 for imrpoved balance without device in short distances.    Baseline 5/24; 10/24 05/30/21    Time 4    Period Weeks    Status Achieved               PT Long Term Goals - 04/18/21 1323       PT LONG TERM GOAL #1   Title Pt will be independent with progression of HEP for improved balance, strength, gait.  TARGET 06/20/21    Baseline not currently performing an HEP    Time 8    Period Weeks    Status New      PT LONG TERM GOAL #2   Title Pt will improve gait velocity to at least 2.62 ft/sec for improved gait efficiency and safety.    Baseline 2.07 ft/sec at eval (no device)    Time 8    Period Weeks    Status New      PT LONG TERM GOAL #3   Title Pt will improve TUG score to less than or equal to 13.5 sec for decreased fall risk.    Baseline 19.19 sec    Time 8    Period Weeks  Status New      PT LONG TERM GOAL #4   Title Pt will ambulate up/down 4 steps with bilateral rails supervision for improved safety with household access.    Baseline supervision/min guard, decreased foot clearance.    Time 8    Period Weeks    Status New      PT LONG TERM GOAL #5   Title Pt will ambulate at least 1000 ft indoor and outdoor surfaces, using rollator, mod I, for improved community gait.    Baseline supervision/min guard with no device short distance gait    Time 8    Period Weeks    Status New                   Plan - 06/09/21 0925     Clinical Impression Statement Pt late to today's session due to transportation.   Focus of skilled PT session was to assess some functional measures, in preparation for request additional authorization of visits (after next session), and to address longer distance gait.  Pt ambulates 480 ft distance today, able to maintain good foot clearance and heelstrike with cues, and she does have some shuffling with turns.  She will continue to benefit from skilled PT to fruther address strength, balance, and gait towards goals of improved functional mobility and decreased falls.    Personal Factors and Comorbidities Comorbidity 3+;Behavior Pattern    Comorbidities See above    Examination-Activity Limitations Locomotion Level;Transfers;Stairs;Stand    Examination-Participation Restrictions Occupation;Community Activity;Shop    Stability/Clinical Decision Making Evolving/Moderate complexity    Rehab Potential Good    PT Frequency 2x / week    PT Duration 8 weeks   plus eval   PT Treatment/Interventions DME Instruction;Neuromuscular re-education;Balance training;Therapeutic exercise;Therapeutic activities;Functional mobility training;Stair training;Gait training;Patient/family education;Orthotic Fit/Training;Passive range of motion;Manual techniques    PT Next Visit Plan Core stability/abdominal activation- continue with pball, continue to work on strengthening, balance, gait and stair training, working towards [place].  Auth is up on 7/12, so will neeed to decide about reauth/more visits.  *Check LTGs and resubmit for auth; go ahead and recert.    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   Lymphedema of left upper extremity 12/09/2020   Brain metastases ([place]) 11/01/2019   Thrombocytopenia ([place]) 08/09/2019   Port-A-Cath in place 06/20/2019   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[place]) 03/30/2019   S/P mastectomy, left 12/09/2018   S/P breast reconstruction, left 12/09/2018   S/P radiation therapy 12/09/2018   GERD (gastroesophageal reflux disease) 12/10/2017   Edema 11/09/2017   Sensorineural hearing loss (SNHL) of both ears 11/05/2017   Vitamin D deficiency 01/18/2017   Bipolar I disorder, most recent episode depressed ([place]) 12/08/2016   Adjustment disorder with anxiety 12/06/2016   History of cancer metastatic to brain 07/27/2016   Iron deficiency anemia 06/26/2016   Bone metastases ([place]) 06/03/2016  Primary cancer of lower-inner quadrant of left female breast ([place]) 06/01/2016   Tobacco abuse 07/22/2015   Alcoholism in remission ([place]) 07/22/2015   Malignant neoplasm of female breast ([place]) 07/01/2015   Current smoker 03/28/2015   Goals of care, counseling/discussion 03/28/2015   Seasonal allergies 03/28/2015   Anxiety state 02/28/2015   Fibromyalgia 02/28/2015   Family history of diabetes mellitus 02/28/2015   H/O alcohol abuse     Leenah Seidner W. 06/09/2021, 9:28 AM Frazier Butt., PT   [facility] [address] [place], [place], [address] Phone: [phone]   Fax:  [phone]  Name: [name] MRN: [id number] Date of Birth: 1961/05/04

## 2021-06-10 ENCOUNTER — Ambulatory Visit: Payer: Medicaid Other | Admitting: Physical Therapy

## 2021-06-10 DIAGNOSIS — R2689 Other abnormalities of gait and mobility: Secondary | ICD-10-CM

## 2021-06-10 DIAGNOSIS — M6281 Muscle weakness (generalized): Secondary | ICD-10-CM

## 2021-06-10 DIAGNOSIS — R2681 Unsteadiness on feet: Secondary | ICD-10-CM | POA: Diagnosis not present

## 2021-06-10 NOTE — Therapy (Signed)
[facility] [address] [place], [place], [address] Phone: [phone]   Fax:  [phone]  Physical Therapy Treatment  Patient Details  Name: [name] MRN: [id number] Date of Birth: [birth year] Referring Provider (PT): Magrinat, Sarajane Jews   Encounter Date: 06/10/2021   PT End of Session - 06/10/21 0934     Visit Number 8    Number of Visits 17    Authorization Type Medicaid    Authorization Time Period 12 visits from 04/30/21-06/10/21    Authorization - Visit Number 7    Authorization - Number of Visits 12    PT Start Time 9381    PT Stop Time 1016    PT Time Calculation (min) 41 min    Equipment Utilized During Treatment Gait belt    Activity Tolerance Patient tolerated treatment well;Patient limited by fatigue    Behavior During Therapy [name] for tasks assessed/performed;Impulsive             Past Medical History:  Diagnosis Date   Alcohol abuse    none since 2013   Anemia    during chemo   Anxiety    At age 60   Arthritis Dx 2010   Bipolar disorder ([place])    Bronchitis    Cancer ([place])    breast mets to brain   CHF (congestive heart failure) (HCC)    Chronic pain    resolved per patient 07/28/92   Complication of anesthesia    Depression    Family history of adverse reaction to anesthesia    MOther had PONV   GERD (gastroesophageal reflux disease)    Headache    hx  migraines   HLD (hyperlipidemia)    Hypertension    Lymphedema of left arm    Opiate dependence ([place])    PONV (postoperative nausea and vomiting)    Port-A-Cath in place    PTSD (post-traumatic stress disorder)    S/P endometrial ablation    in md's office    Past Surgical History:  Procedure Laterality Date   APPLICATION OF CRANIAL NAVIGATION N/A 08/14/2016   Procedure: APPLICATION OF CRANIAL NAVIGATION;  Surgeon: Erline Levine, MD;  Location: [facility];  Service: Neurosurgery;  Laterality: N/A;   BREAST RECONSTRUCTION Left     with silicone implant   COLONOSCOPY W/ POLYPECTOMY     CRANIOTOMY N/A 08/14/2016   Procedure: CRANIOTOMY TUMOR EXCISION WITH Lucky Rathke;  Surgeon: Erline Levine, MD;  Location: [facility];  Service: Neurosurgery;  Laterality: N/A;   FIBULA FRACTURE SURGERY Left    MASTECTOMY Left    RADIOLOGY WITH ANESTHESIA N/A 07/23/2016   Procedure: MRI OF BRAIN WITH AND WITHOUT;  Surgeon: Medication Radiologist, MD;  Location: [facility];  Service: Radiology;  Laterality: N/A;   RADIOLOGY WITH ANESTHESIA N/A 09/08/2016   Procedure: MRI OF BRAIN WITH AND WITHOUT CONTRAST;  Surgeon: Medication Radiologist, MD;  Location: [facility];  Service: Radiology;  Laterality: N/A;   RADIOLOGY WITH ANESTHESIA N/A 12/10/2016   Procedure: MRI OF BRAIN WITH AND WITHOUT;  Surgeon: Medication Radiologist, MD;  Location: [facility];  Service: Radiology;  Laterality: N/A;   RADIOLOGY WITH ANESTHESIA N/A 03/02/2017   Procedure: MRI of BRAIN W and W/OUT CONTRAST;  Surgeon: Medication Radiologist, MD;  Location: [facility];  Service: Radiology;  Laterality: N/A;   RADIOLOGY WITH ANESTHESIA N/A 07/29/2017   Procedure: RADIOLOGY WITH ANESTHESIA MRI OF BRAIN WITH AND WITHOUT CONTRAST;  Surgeon: Radiologist, Medication, MD;  Location: [facility];  Service: Radiology;  Laterality: N/A;   RADIOLOGY WITH ANESTHESIA N/A 12/07/2017   Procedure: MRI WITH ANESTHESIA OF BRAIN WITH AND WITHOUT CONTRAST;  Surgeon: Radiologist, Medication, MD;  Location: [facility];  Service: Radiology;  Laterality: N/A;   RADIOLOGY WITH ANESTHESIA N/A 04/07/2018   Procedure: MRI OF BRAIN WITH AND WITHOUT CONTRAST;  Surgeon: Radiologist, Medication, MD;  Location: [facility];  Service: Radiology;  Laterality: N/A;   RADIOLOGY WITH ANESTHESIA N/A 08/23/2018   Procedure: MRI WITH ANESTHESIA OF THE BRAIN WITH AND WITHOUT;  Surgeon: Radiologist, Medication, MD;  Location: [facility];  Service: Radiology;  Laterality: N/A;   RADIOLOGY WITH ANESTHESIA N/A 01/24/2019   Procedure: MRI OF BRAIN WITH AND WITHOUT  CONTRAST;  Surgeon: Radiologist, Medication, MD;  Location: [facility];  Service: Radiology;  Laterality: N/A;   RADIOLOGY WITH ANESTHESIA N/A 07/06/2019   Procedure: MRI WITH ANESTHESIA OF BRAIN WITH AND WITHOUT CONTRAST;  Surgeon: Radiologist, Medication, MD;  Location: [facility];  Service: Radiology;  Laterality: N/A;   RADIOLOGY WITH ANESTHESIA N/A 01/11/2020   Procedure: MRI WITH ANESTHESIA BRAIN WITH AND WITHOUT;  Surgeon: Radiologist, Medication, MD;  Location: [facility];  Service: Radiology;  Laterality: N/A;   RADIOLOGY WITH ANESTHESIA N/A 05/21/2020   Procedure: MRI WITH ANESTHESIA OF BRAIN WITH AND WITHOUT CONTRAST;  Surgeon: Radiologist, Medication, MD;  Location: [facility];  Service: Radiology;  Laterality: N/A;   RADIOLOGY WITH ANESTHESIA N/A 01/02/2021   Procedure: MRI WITH ANESTHESIA  BRAIN WITH AND WITHOUT CONTRAST,TOTAL SPINE MET SCREENING;  Surgeon: Radiologist, Medication, MD;  Location: [facility];  Service: Radiology;  Laterality: N/A;   right power port placement Right     There were no vitals filed for this visit.   Subjective Assessment - 06/10/21 0934     Subjective Everything's the same; no changes.    Pertinent History PMH: breast cancer with mets to brain/lymph node, alcohol abuse (none since 2013), anemia (during chemo), anxiety, arthritis, bipoloar, bronchitis, headaches (hx of migraines), lymphedema of left arm, PTSD    Limitations Sitting;Standing;Walking    Patient Stated Goals Pt's goals for PT are to get back on my feet more steady.    Currently in Pain? No/denies                [place] [facility] PT Assessment - 06/10/21 0001       6 Minute Walk- Baseline   6 Minute Walk- Baseline yes    BP (mmHg) 120/82    HR (bpm) 100    02 Sat (%RA) 97 %      6 Minute walk- Post Test   6 Minute Walk Post Test yes    BP (mmHg) 118/80    HR (bpm) 102    02 Sat (%RA) 95 %    Modified Borg Scale for Dyspnea 3- Moderate shortness of breath or breathing difficulty    Perceived Rate of Exertion  (Borg) 12-      6 minute walk test results    Aerobic Endurance Distance Walked 619    Endurance additional comments 2 seated rest breaks at locked rollator walker                           [facility] Adult PT Treatment/Exercise - 06/10/21 0001       Transfers   Transfers Sit to Stand;Stand to Sit    Sit to Stand 5: Supervision;From chair/3-in-1;With upper extremity assist    Five time sit to stand comments  14.28   without UE support   Stand to Sit 5: Supervision;Without upper extremity assist;To chair/3-in-1;With upper extremity assist    Comments 30 seconds sit to stand:  Pt stands 12 times in 30.81 seconds      Ambulation/Gait   Ambulation/Gait Yes    Ambulation/Gait Assistance 5: Supervision;4: Min guard    Ambulation/Gait Assistance Details See notes from [facility]    Ambulation Distance (Feet) 15 Feet   x 6 reps, no device, simulating household walking for walking program; additional 60 ft x 2   Assistive device Rollator;None    Gait Pattern Step-through pattern;Decreased arm swing - right;Decreased arm swing - left;Decreased step length - right;Decreased step length - left;Poor foot clearance - left;Narrow base of support;Shuffle;Poor foot clearance - right    Ambulation Surface Level;Indoor    Gait velocity 13.69 sec= 2.39 ft/sec    Gait Comments Discussed walking program for home-see instructions for details.  Instructed to do this walking without device (as she does not use rollator in home), slowed pace, attention to clearing feet/stop if faitgued or she begins shuffling.      Knee/Hip Exercises: Aerobic   Nustep UE/LE's level 3 for 4 minutes,  with goal >/= 50 steps per minute for strengthening and activity tolerance.                    PT Education - 06/10/21 1810     Education Details Walking program for home; POC    Person(s) Educated Patient    Methods Explanation;Demonstration;Verbal cues;Handout    Comprehension Verbalized  understanding;Returned demonstration              PT Short Term Goals - 05/30/21 0906       PT SHORT TERM GOAL #1   Title Pt will be independent with HEP for strength, gait, and balance.  TARGET 05/23/2021    Baseline not currently performing HEP-pt lost previous HEP packet; 05/30/21:  min cues and performing HEP 4-5x/wk    Time 4    Period Weeks    Status Partially Met      PT SHORT TERM GOAL #2   Title Pt will decrease 5 x sit to stand from 20.44 sec to <15 sec for improved balance and functional strength.    Baseline 20.44 sec no UE support; 16.29    Time 4    Period Weeks    Status Partially Met      PT SHORT TERM GOAL #3   Title Pt will improve TUG score to less than or equal to 15 seconds for decreased fall risk.    Baseline TUG 19.19 sec; 19.62 sec 05/30/21    Time 4    Period Weeks    Status Not Met      PT SHORT TERM GOAL #4   Title Pt will verbalize understanding of fall prevention education.    Baseline Pt at fall risk per TUG and DGI scores    Time 4    Period Weeks    Status Achieved      PT SHORT TERM GOAL #5   Title Pt will improve DGI score to at least 9/24 for imrpoved balance without device in short distances.    Baseline 5/24; 10/24 05/30/21    Time 4    Period Weeks    Status Achieved               PT Long Term Goals - 06/10/21 1812       PT  LONG TERM GOAL #1   Title Pt will be independent with progression of HEP for improved balance, strength, gait.  TARGET 06/20/21    Baseline 06/10/2021:  continueing to modify HEP, with walking program added today    Time 8    Period Weeks    Status On-going      PT LONG TERM GOAL #2   Title Pt will improve gait velocity to at least 2.62 ft/sec for improved gait efficiency and safety.    Baseline 2.07 ft/sec at eval (no device); 2.39 ft/sec 06/10/2021    Time 8    Period Weeks    Status Partially Met      PT LONG TERM GOAL #3   Title Pt will improve TUG score to less than or equal to 13.5 sec for  decreased fall risk.    Baseline 19.19 sec; 20.31 sec 06/09/2021    Time 8    Period Weeks    Status Not Met      PT LONG TERM GOAL #4   Title Pt will ambulate up/down 4 steps with bilateral rails supervision for improved safety with household access.    Baseline supervision/min guard, decreased foot clearance.; 06/10/21-have not fully addressed    Time 8    Period Weeks    Status On-going      PT LONG TERM GOAL #5   Title Pt will ambulate at least 1000 ft indoor and outdoor surfaces, using rollator, mod I, for improved community gait.    Baseline Pt ambulates 619 ft indoor surfaces, rollator, with 2 seated rest breaks due to fatigue 06/10/21    Time 8    Period Weeks    Status Not Met                   Plan - 06/10/21 1811     Clinical Impression Statement Today is visit 7 of 12 approved visits for PT.  Pt has missed several appointments due to transportation issues.  Her STGs were assessed on 05/30/2021, with pt partially meeting 2 goals, meeting 2 goals and not meeting one goal.  She is improving with 5x sit to stand, indicating increased functional strength.  She has improved gait velocity to 2.39 ft/sec (compared to 2.07 ft/sec at eval); this conitnues to indicate she is a limited community ambulator.  Pt conitnues to be at significant fall risk per TUG and dynamic gait index scores; PT does recommend use of rollator at all times when not in home.  LTGs begun to be assessed today, with LTG 1 ongoing (HEP continues to be modified), LTG 2 partially met for improved gait velocity, just not to goal level.  LTG 3 not met for TUG score; LTG 4 not met, as stairs not yet fully addressed with patient.  LTG 5 not met , but pt is improving with her gait distances prior to fatigue limitations, just not to goal level.  Due to pt's missing several visits due to medical transportation issues as well as her ongoing infusion treatment for other medical issues, she would benefit from additional  skilled PT to address strength, balance, gait towards LTGs.  She is making progress slowly and steadily, just not to goal level, and due to pt continueing to be at fall risk, she would benefit from additional therapy to decrease fall risk and improve overall funcitonal mobility.  Full recert to be completed at next visit, with plan for 2x/wk for additional 8 weeks.    Personal Factors and  Comorbidities Comorbidity 3+;Behavior Pattern    Comorbidities See above    Examination-Activity Limitations Locomotion Level;Transfers;Stairs;Stand    Examination-Participation Restrictions Occupation;Community Activity;Shop    Stability/Clinical Decision Making Evolving/Moderate complexity    Rehab Potential Good    PT Frequency 2x / week    PT Duration 8 weeks   plus eval   PT Treatment/Interventions DME Instruction;Neuromuscular re-education;Balance training;Therapeutic exercise;Therapeutic activities;Functional mobility training;Stair training;Gait training;Patient/family education;Orthotic Fit/Training;Passive range of motion;Manual techniques    PT Next Visit Plan Additional auth requested through Medicaid after 7/12 visit.  When patient returns, PT needs to complete full recert.  Check on how walking program has gone; check HEP.  Continue to work on Paramedic.    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[place])  03/11/2021   Lymphedema of left upper extremity 12/09/2020   Brain metastases ([place]) 11/01/2019   Thrombocytopenia ([place]) 08/09/2019   Port-A-Cath in place 06/20/2019   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[place]) 03/30/2019   S/P mastectomy, left 12/09/2018   S/P breast reconstruction, left 12/09/2018   S/P radiation therapy 12/09/2018   GERD (gastroesophageal reflux disease) 12/10/2017   Edema 11/09/2017   Sensorineural hearing loss (SNHL) of both ears 11/05/2017   Vitamin D deficiency 01/18/2017   Bipolar I disorder, most recent episode depressed ([facility] [place]) 12/08/2016   Adjustment disorder with anxiety 12/06/2016   History of cancer metastatic to brain 07/27/2016   Iron deficiency anemia 06/26/2016   Bone metastases ([place]) 06/03/2016   Primary cancer of lower-inner quadrant of left female breast ([place]) 06/01/2016   Tobacco abuse 07/22/2015   Alcoholism in remission ([place]) 07/22/2015   Malignant neoplasm of female breast ([place]) 07/01/2015   Current smoker 03/28/2015   Goals of care, counseling/discussion 03/28/2015   Seasonal allergies 03/28/2015   Anxiety state 02/28/2015   Fibromyalgia 02/28/2015   Family history of diabetes mellitus 02/28/2015   H/O alcohol abuse     Elek Holderness W. 06/10/2021, 6:28 PM [facility] [address] [facility] [address] [place], [place], [address] Phone: [phone]   Fax:  [phone]  Name: [name] MRN: [id number] Date of Birth: 05-02-1961

## 2021-06-10 NOTE — Patient Instructions (Signed)
WALKING  Walking is a great form of exercise to increase your strength, endurance and overall fitness.  A walking program can help you start slowly and gradually build endurance as you go.  Everyone's ability is different, so each person's starting point will be different.  You do not have to follow them exactly.  The are just samples. You should simply find out what's right for you and stick to that program.   In the beginning, you'll start off walking 2-3 times a day for short distances.  As you get stronger, you'll be walking further at just 1-2 times per day.  A. You Can Walk For A Certain Length Of Time Each Day    Walk 2 minutes 3 times per day.  Increase 1 minute every 5-7 days (3 times per day).  Work up to 5-8 minutes (1-2 times per day).   Example:   Day 1-7 2 minutes 3 times per day   Day 8-15 3-4 minutes 2-3 times per day   Day 16-30 5-8 minutes 1-2 times per day

## 2021-06-13 ENCOUNTER — Telehealth: Payer: Self-pay | Admitting: Rehabilitation

## 2021-06-13 ENCOUNTER — Ambulatory Visit: Payer: Medicaid Other | Admitting: Rehabilitation

## 2021-06-13 NOTE — Telephone Encounter (Signed)
Attempted to return call regarding compression garment status but no answer.  Will retry next week.

## 2021-06-19 ENCOUNTER — Other Ambulatory Visit: Payer: Medicaid Other

## 2021-06-19 ENCOUNTER — Ambulatory Visit: Payer: Medicaid Other

## 2021-06-24 ENCOUNTER — Ambulatory Visit: Payer: Medicaid Other | Admitting: Physical Therapy

## 2021-06-24 ENCOUNTER — Telehealth: Payer: Self-pay | Admitting: Physical Therapy

## 2021-06-24 NOTE — Telephone Encounter (Signed)
Called pt about her missed PT appointment today. Left message on voice mail with date/time of her next PT appointment. Requested pt call if she will be unable to keep this next appointment, along with the number to call.   Willow Ora, PTA, [facility] [address] [place], [place] [address] [phone] 06/24/21, 9:57 AM

## 2021-06-26 ENCOUNTER — Inpatient Hospital Stay: Payer: Medicaid Other

## 2021-06-26 ENCOUNTER — Other Ambulatory Visit: Payer: Self-pay

## 2021-06-26 VITALS — Temp 98.1°F

## 2021-06-26 DIAGNOSIS — C50119 Malignant neoplasm of central portion of unspecified female breast: Secondary | ICD-10-CM

## 2021-06-26 DIAGNOSIS — C50312 Malignant neoplasm of lower-inner quadrant of left female breast: Secondary | ICD-10-CM

## 2021-06-26 DIAGNOSIS — C770 Secondary and unspecified malignant neoplasm of lymph nodes of head, face and neck: Secondary | ICD-10-CM

## 2021-06-26 DIAGNOSIS — Z5112 Encounter for antineoplastic immunotherapy: Secondary | ICD-10-CM | POA: Diagnosis not present

## 2021-06-26 DIAGNOSIS — D696 Thrombocytopenia, unspecified: Secondary | ICD-10-CM

## 2021-06-26 MED ORDER — SODIUM CHLORIDE 0.9% FLUSH
10.0000 mL | INTRAVENOUS | Status: DC | PRN
  Administered 2021-06-26: 10 mL
  Filled 2021-06-26: qty 10

## 2021-06-26 MED ORDER — ACETAMINOPHEN 325 MG PO TABS
ORAL_TABLET | ORAL | Status: AC
  Filled 2021-06-26: qty 2

## 2021-06-26 MED ORDER — HEPARIN SOD (PORK) LOCK FLUSH 100 UNIT/ML IV SOLN
500.0000 [IU] | Freq: Once | INTRAVENOUS | Status: AC | PRN
  Administered 2021-06-26: 500 [IU]
  Filled 2021-06-26: qty 5

## 2021-06-26 MED ORDER — SODIUM CHLORIDE 0.9 % IV SOLN
Freq: Once | INTRAVENOUS | Status: AC
  Filled 2021-06-26: qty 250

## 2021-06-26 MED ORDER — ACETAMINOPHEN 325 MG PO TABS
650.0000 mg | ORAL_TABLET | Freq: Once | ORAL | Status: AC
Start: 2021-06-26 — End: 2021-06-26
  Administered 2021-06-26: 650 mg via ORAL

## 2021-06-26 MED ORDER — TRASTUZUMAB-DKST CHEMO 150 MG IV SOLR
6.0000 mg/kg | Freq: Once | INTRAVENOUS | Status: AC
  Administered 2021-06-26: 399 mg via INTRAVENOUS
  Filled 2021-06-26: qty 19

## 2021-06-26 NOTE — Patient Instructions (Signed)
[facility]  Discharge Instructions: Thank you for choosing [facility] to provide your oncology and hematology care.   If you have a lab appointment with the Cancer Center, please go directly to the Cancer Center and check in at the registration area.   Wear comfortable clothing and clothing appropriate for easy access to any Portacath or PICC line.   We strive to give you quality time with your provider. You may need to reschedule your appointment if you arrive late (15 or more minutes).  Arriving late affects you and other patients whose appointments are after yours.  Also, if you miss three or more appointments without notifying the office, you may be dismissed from the clinic at the provider's discretion.      For prescription refill requests, have your pharmacy contact our office and allow 72 hours for refills to be completed.    Today you received the following chemotherapy and/or immunotherapy agents trastuzumab      To help prevent nausea and vomiting after your treatment, we encourage you to take your nausea medication as directed.  BELOW ARE SYMPTOMS THAT SHOULD BE REPORTED IMMEDIATELY: *FEVER GREATER THAN 100.4 F (38 C) OR HIGHER *CHILLS OR SWEATING *NAUSEA AND VOMITING THAT IS NOT CONTROLLED WITH YOUR NAUSEA MEDICATION *UNUSUAL SHORTNESS OF BREATH *UNUSUAL BRUISING OR BLEEDING *URINARY PROBLEMS (pain or burning when urinating, or frequent urination) *BOWEL PROBLEMS (unusual diarrhea, constipation, pain near the anus) TENDERNESS IN MOUTH AND THROAT WITH OR WITHOUT PRESENCE OF ULCERS (sore throat, sores in mouth, or a toothache) UNUSUAL RASH, SWELLING OR PAIN  UNUSUAL VAGINAL DISCHARGE OR ITCHING   Items with * indicate a potential emergency and should be followed up as soon as possible or go to the Emergency Department if any problems should occur.  Please show the CHEMOTHERAPY ALERT CARD or IMMUNOTHERAPY ALERT CARD at check-in to  the Emergency Department and triage nurse.  Should you have questions after your visit or need to cancel or reschedule your appointment, please contact [facility]  Dept: [phone]  and follow the prompts.  Office hours are 8:00 a.m. to 4:30 p.m. Monday - Friday. Please note that voicemails left after 4:00 p.m. may not be returned until the following business day.  We are closed weekends and major holidays. You have access to a nurse at all times for urgent questions. Please call the main number to the clinic Dept: [phone] and follow the prompts.   For any non-urgent questions, you may also contact your provider using MyChart. We now offer e-Visits for anyone 18 and older to request care online for non-urgent symptoms. For details visit [email or web address].   Also download the MyChart app! Go to the app store, search "MyChart", open the app, select [facility], and log in with your MyChart username and password.  Due to Covid, a mask is required upon entering the hospital/clinic. If you do not have a mask, one will be given to you upon arrival. For doctor visits, patients may have 1 support person aged 18 or older with them. For treatment visits, patients cannot have anyone with them due to current Covid guidelines and our immunocompromised population.   

## 2021-06-27 ENCOUNTER — Ambulatory Visit: Payer: Medicaid Other | Admitting: Physical Therapy

## 2021-06-27 DIAGNOSIS — R293 Abnormal posture: Secondary | ICD-10-CM

## 2021-06-27 DIAGNOSIS — R2689 Other abnormalities of gait and mobility: Secondary | ICD-10-CM

## 2021-06-27 DIAGNOSIS — R2681 Unsteadiness on feet: Secondary | ICD-10-CM

## 2021-06-27 DIAGNOSIS — M6281 Muscle weakness (generalized): Secondary | ICD-10-CM

## 2021-06-27 NOTE — Therapy (Signed)
[facility] [address] [place], [place], [address] Phone: [phone]   Fax:  [phone]  Physical Therapy Treatment/Recert Visit  Patient Details  Name: [name] MRN: [id number] Date of Birth: 09-Jan-1961 Referring Provider (PT): [name], [name]   Encounter Date: 06/27/2021   PT End of Session - 06/27/21 1017     Visit Number 9    Number of Visits 20    Authorization Type Medicaid    Authorization Time Period 12 visits from 06/24/21-08/04/21    Authorization - Visit Number 1    Authorization - Number of Visits 12    PT Start Time 1470    PT Stop Time 1059    PT Time Calculation (min) 41 min    Equipment Utilized During Treatment Gait belt    Activity Tolerance Patient tolerated treatment well;Patient limited by fatigue    Behavior During Therapy [name] for tasks assessed/performed;Impulsive             Past Medical History:  Diagnosis Date   Alcohol abuse    none since 2013   Anemia    during chemo   Anxiety    At age 58   Arthritis Dx 2010   Bipolar disorder ([place])    Bronchitis    Cancer ([place])    breast mets to brain   CHF (congestive heart failure) (HCC)    Chronic pain    resolved per patient 08/28/56   Complication of anesthesia    Depression    Family history of adverse reaction to anesthesia    MOther had PONV   GERD (gastroesophageal reflux disease)    Headache    hx  migraines   HLD (hyperlipidemia)    Hypertension    Lymphedema of left arm    Opiate dependence ([place])    PONV (postoperative nausea and vomiting)    Port-A-Cath in place    PTSD (post-traumatic stress disorder)    S/P endometrial ablation    in Hunter's office    Past Surgical History:  Procedure Laterality Date   APPLICATION OF CRANIAL NAVIGATION N/A 08/14/2016   Procedure: APPLICATION OF CRANIAL NAVIGATION;  Surgeon: Shannon Levine, Hunter;  Location: [facility];  Service: Neurosurgery;  Laterality: N/A;   BREAST  RECONSTRUCTION Left    with silicone implant   COLONOSCOPY W/ POLYPECTOMY     CRANIOTOMY N/A 08/14/2016   Procedure: CRANIOTOMY TUMOR EXCISION WITH [name];  Surgeon: Shannon Levine, Hunter;  Location: [facility];  Service: Neurosurgery;  Laterality: N/A;   FIBULA FRACTURE SURGERY Left    MASTECTOMY Left    RADIOLOGY WITH ANESTHESIA N/A 07/23/2016   Procedure: MRI OF BRAIN WITH AND WITHOUT;  Surgeon: [name];  Location: [facility];  Service: Radiology;  Laterality: N/A;   RADIOLOGY WITH ANESTHESIA N/A 09/08/2016   Procedure: MRI OF BRAIN WITH AND WITHOUT CONTRAST;  Surgeon: [name];  Location: [facility];  Service: Radiology;  Laterality: N/A;   RADIOLOGY WITH ANESTHESIA N/A 12/10/2016   Procedure: MRI OF BRAIN WITH AND WITHOUT;  Surgeon: [name];  Location: [facility];  Service: Radiology;  Laterality: N/A;   RADIOLOGY WITH ANESTHESIA N/A 03/02/2017   Procedure: MRI of BRAIN W and W/OUT CONTRAST;  Surgeon: [name];  Location: [facility];  Service: Radiology;  Laterality: N/A;   RADIOLOGY WITH ANESTHESIA N/A 07/29/2017   Procedure: RADIOLOGY WITH ANESTHESIA MRI OF BRAIN WITH AND WITHOUT CONTRAST;  Surgeon: [name];  Location: [facility];  Service: Radiology;  Laterality: N/A;   RADIOLOGY WITH ANESTHESIA N/A 12/07/2017   Procedure: MRI WITH ANESTHESIA OF BRAIN WITH AND WITHOUT CONTRAST;  Surgeon: [name];  Location: [facility];  Service: Radiology;  Laterality: N/A;   RADIOLOGY WITH ANESTHESIA N/A 04/07/2018   Procedure: MRI OF BRAIN WITH AND WITHOUT CONTRAST;  Surgeon: [name];  Location: [facility];  Service: Radiology;  Laterality: N/A;   RADIOLOGY WITH ANESTHESIA N/A 08/23/2018   Procedure: MRI WITH ANESTHESIA OF THE BRAIN WITH AND WITHOUT;  Surgeon: [name];  Location: [facility];  Service: Radiology;  Laterality: N/A;   RADIOLOGY WITH ANESTHESIA N/A 01/24/2019   Procedure: MRI OF BRAIN  WITH AND WITHOUT CONTRAST;  Surgeon: [name];  Location: [facility];  Service: Radiology;  Laterality: N/A;   RADIOLOGY WITH ANESTHESIA N/A 07/06/2019   Procedure: MRI WITH ANESTHESIA OF BRAIN WITH AND WITHOUT CONTRAST;  Surgeon: [name];  Location: [facility];  Service: Radiology;  Laterality: N/A;   RADIOLOGY WITH ANESTHESIA N/A 01/11/2020   Procedure: MRI WITH ANESTHESIA BRAIN WITH AND WITHOUT;  Surgeon: [name];  Location: [facility];  Service: Radiology;  Laterality: N/A;   RADIOLOGY WITH ANESTHESIA N/A 05/21/2020   Procedure: MRI WITH ANESTHESIA OF BRAIN WITH AND WITHOUT CONTRAST;  Surgeon: [name];  Location: [facility];  Service: Radiology;  Laterality: N/A;   RADIOLOGY WITH ANESTHESIA N/A 01/02/2021   Procedure: MRI WITH ANESTHESIA  BRAIN WITH AND WITHOUT CONTRAST,TOTAL SPINE MET SCREENING;  Surgeon: [name];  Location: [facility];  Service: Radiology;  Laterality: N/A;   right power port placement Right     There were no vitals filed for this visit.   Subjective Assessment - 06/27/21 1017     Subjective No falls, no stumbles.  Pt reports using her rollator over the weekend at her son's outdoor wedding, with no problems.    Pertinent History PMH: breast cancer with mets to brain/lymph node, alcohol abuse (none since 2013), anemia (during chemo), anxiety, arthritis, bipoloar, bronchitis, headaches (hx of migraines), lymphedema of left arm, PTSD    Limitations Sitting;Standing;Walking    Patient Stated Goals Pt's goals for PT are to get back on my feet more steady.    Currently in Pain? No/denies                            Transfers performed at least 8 reps throughout session-from chair, locked rollator, mat-cues each time for hand placement and technique   [facility] Adult PT Treatment/Exercise - 06/27/21 0001       Transfers   Transfers Sit to Stand;Stand to Sit    Sit to Stand 5:  Supervision;From chair/3-in-1;With upper extremity assist;From bed    Stand to Sit 5: Supervision;To chair/3-in-1;With upper extremity assist;To bed      Ambulation/Gait   Ambulation/Gait Yes    Ambulation/Gait Assistance 5: Supervision;4: Min guard    Ambulation/Gait Assistance Details 15 x 10 reps (3 minutes), no device with supervsiion/min guard, simulating indoor house distances for walking program.    Ambulation Distance (Feet) 460 Feet    Assistive device Rollator;None    Gait Pattern Step-through pattern;Decreased arm swing - right;Decreased arm swing - left;Decreased step length - right;Decreased step length - left;Poor foot clearance - left;Narrow base of support;Shuffle;Poor foot clearance - right    Ambulation Surface Level;Indoor    Stairs Yes    Stairs Assistance 4: Min guard  Stair Management Technique Two rails;Alternating pattern   Decreased foot clearance, decreased forward excursion through stance leg to initiate foot clearance descending steps   Number of Stairs 4   x 4   Height of Stairs 6    Gait Comments Reviewed walking program at home; pt reprots doing 2 minutes, 3x/day.  Discussed increaseing time to 3 minutes, 3x/day      Exercises   Exercises Knee/Hip      Knee/Hip Exercises: Aerobic   Nustep 4 extremities, level 3 for 4 minutes,  with goal >/= 50 steps per minute for strengthening and activity tolerance.      Knee/Hip Exercises: Standing   Forward Step Up Both;1 set;10 reps;Hand Hold: 2;Step Height: 4"    Forward Step Up Limitations Cues for technique    Step Down Both;1 set;10 reps;Hand Hold: 2;Step Height: 4"    Step Down Limitations Cues for technique and foot clearance, to use stance leg, not as much through [place]                    PT Education - 06/27/21 1256     Education Details Increase time in walking program to 3 minutes, 3x/day    Person(s) Educated Patient    Methods Explanation;Demonstration    Comprehension Verbalized  understanding              PT Short Term Goals - 05/30/21 0906       PT SHORT TERM GOAL #1   Title Pt will be independent with HEP for strength, gait, and balance.  TARGET 05/23/2021    Baseline not currently performing HEP-pt lost previous HEP packet; 05/30/21:  min cues and performing HEP 4-5x/wk    Time 4    Period Weeks    Status Partially Met      PT SHORT TERM GOAL #2   Title Pt will decrease 5 x sit to stand from 20.44 sec to <15 sec for improved balance and functional strength.    Baseline 20.44 sec no UE support; 16.29    Time 4    Period Weeks    Status Partially Met      PT SHORT TERM GOAL #3   Title Pt will improve TUG score to less than or equal to 15 seconds for decreased fall risk.    Baseline TUG 19.19 sec; 19.62 sec 05/30/21    Time 4    Period Weeks    Status Not Met      PT SHORT TERM GOAL #4   Title Pt will verbalize understanding of fall prevention education.    Baseline Pt at fall risk per TUG and DGI scores    Time 4    Period Weeks    Status Achieved      PT SHORT TERM GOAL #5   Title Pt will improve DGI score to at least 9/24 for imrpoved balance without device in short distances.    Baseline 5/24; 10/24 05/30/21    Time 4    Period Weeks    Status Achieved               PT Long Term Goals - 06/27/21 1258       PT LONG TERM GOAL #1   Title Pt will be independent with progression of HEP for improved balance, strength, gait.  TARGET 06/20/21    Baseline 06/10/2021:  continueing to modify HEP, with walking program added today    Time 8    Period Weeks  Status On-going      PT LONG TERM GOAL #2   Title Pt will improve gait velocity to at least 2.62 ft/sec for improved gait efficiency and safety.    Baseline 2.07 ft/sec at eval (no device); 2.39 ft/sec 06/10/2021    Time 8    Period Weeks    Status Partially Met      PT LONG TERM GOAL #3   Title Pt will improve TUG score to less than or equal to 13.5 sec for decreased fall risk.     Baseline 19.19 sec; 20.31 sec 06/09/2021    Time 8    Period Weeks    Status Not Met      PT LONG TERM GOAL #4   Title Pt will ambulate up/down 4 steps with bilateral rails supervision for improved safety with household access.    Baseline supervision/min guard, decreased foot clearance 06/27/21    Time 8    Period Weeks    Status On-going      PT LONG TERM GOAL #5   Title Pt will ambulate at least 1000 ft indoor and outdoor surfaces, using rollator, mod I, for improved community gait.    Baseline Pt ambulates 619 ft indoor surfaces, rollator, with 2 seated rest breaks due to fatigue 06/10/21    Time 8    Period Weeks    Status Not Met             New goals for recert:   PT Short Term Goals - 06/27/21 1309       PT SHORT TERM GOAL #1   Title Pt will be independent with progression of HEP for strength, gait, and balance.  TARGET 07/25/2021    Baseline HEP additions ongoing 06/27/2021    Time 4    Period Weeks    Status Revised      PT SHORT TERM GOAL #2   Title Pt will decrease 5 x sit to stand to less than 13 seconds for improved balance and functional strength.    Baseline 14.58 sec 06/27/21    Time 4    Period Weeks    Status Revised      PT SHORT TERM GOAL #3   Title Pt will improve 6MWT to at least 700 ft for improved gait efficiency for community gait.    Baseline 619 ft with 2 seated breaks    Time 4    Period Weeks    Status New      PT SHORT TERM GOAL #4   Title --    Baseline --    Time --    Period --    Status --      PT SHORT TERM GOAL #5   Title --    Baseline --    Time --    Period --    Status --             PT Long Term Goals - 06/27/21 1312       PT LONG TERM GOAL #1   Title Pt will be independent with final progression of HEP for improved balance, strength, gait to maximize gains made in PT.  08/08/21    Baseline 06/10/2021:  continueing to modify HEP, with walking program added today    Time 6    Period Weeks    Status Revised       PT LONG TERM GOAL #2   Title Pt will improve gait velocity to at least 2.62  ft/sec for improved gait efficiency and safety.    Baseline 2.07 ft/sec at eval (no device); 2.39 ft/sec 06/10/2021    Time 6    Period Weeks    Status On-going      PT LONG TERM GOAL #3   Title Pt will improve TUG score to less than or equal to 15 sec for decreased fall risk.    Baseline 19.19 sec; 20.31 sec 06/09/2021    Time 6    Period Weeks    Status On-going      PT LONG TERM GOAL #4   Title Pt will ambulate up/down 4 steps with bilateral rails supervision for improved safety with household access.    Baseline supervision/min guard, decreased foot clearance 06/27/21    Time 6    Period Weeks    Status On-going      PT LONG TERM GOAL #5   Title Pt will ambulate at least 1000 ft indoor and outdoor surfaces, using rollator, mod I, for improved community gait.    Baseline Pt ambulates 619 ft indoor surfaces, rollator, with 2 seated rest breaks due to fatigue 06/10/21    Time 6    Period Weeks    Status On-going                   Plan - 06/27/21 1256     Clinical Impression Statement Recert visit completed today, to continue to address functional strength, balance, and gait training.  Pt remains at fall risk per TUG score of 20.31; she demonstrates decreased funcitonal strength with sit to stand and with 6 minute walk test.  She will continue to benefit from skilled PT to fruther address lower extremity weakness, decreased balance, decreased timing/coordination/safety with gait and stairs.  Between last visit and this, LTGs assessed, wtih pt partially meeting LTG 2.  LTG 1, 3, 4, 5 not yet met and ongoing.  She has missed several visits due to transportation and the past several weeks due to wait time for insurance authorization.  See updated goals towards improved mobility and decreased falls.    Personal Factors and Comorbidities Comorbidity 3+;Behavior Pattern    Comorbidities See above     Examination-Activity Limitations Locomotion Level;Transfers;Stairs;Stand    Examination-Participation Restrictions Occupation;Community Activity;Shop    Stability/Clinical Decision Making Evolving/Moderate complexity    Rehab Potential Good    PT Frequency 2x / week    PT Duration 6 weeks   per recert, including 9/62/8366 visit   PT Treatment/Interventions DME Instruction;Neuromuscular re-education;Balance training;Therapeutic exercise;Therapeutic activities;Functional mobility training;Stair training;Gait training;Patient/family education;Orthotic Fit/Training;Passive range of motion;Manual techniques    PT Next Visit Plan Check walking program (how did increase to 3 minutes go?) check HEP; continue to work gait distances, stair training-step ups, step downs    PT Home Exercise Plan Access Code: [email or web address]    Consulted and Agree with Plan of Care Patient             Patient will benefit from skilled therapeutic intervention in order to improve the following deficits and impairments:  Abnormal gait, Difficulty walking, Decreased balance, Decreased mobility, Decreased strength  Visit Diagnosis: Muscle weakness (generalized)  Unsteadiness on feet  Other abnormalities of gait and mobility  Abnormal posture     Problem List Patient Active Problem List   Diagnosis Date Noted   Physical deconditioning 04/14/2021   Balance disorder 04/14/2021   Shoulder disorder 04/14/2021   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   Lymphedema of left upper extremity 12/09/2020  Brain metastases ([employer]) 11/01/2019   Thrombocytopenia ([place]) 08/09/2019   Port-A-Cath in place 06/20/2019   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[place]) 03/30/2019   S/P mastectomy, left 12/09/2018   S/P breast reconstruction, left 12/09/2018   S/P radiation therapy 12/09/2018   GERD (gastroesophageal reflux disease) 12/10/2017   Edema 11/09/2017   Sensorineural hearing loss (SNHL) of both ears 11/05/2017    Vitamin D deficiency 01/18/2017   Bipolar I disorder, most recent episode depressed ([place]) 12/08/2016   Adjustment disorder with anxiety 12/06/2016   History of cancer metastatic to brain 07/27/2016   Iron deficiency anemia 06/26/2016   Bone metastases ([place]) 06/03/2016   Primary cancer of lower-inner quadrant of left female breast ([place]) 06/01/2016   Tobacco abuse 07/22/2015   Alcoholism in remission ([place]) 07/22/2015   Malignant neoplasm of female breast ([place]) 07/01/2015   Current smoker 03/28/2015   Goals of care, counseling/discussion 03/28/2015   Seasonal allergies 03/28/2015   Anxiety state 02/28/2015   Fibromyalgia 02/28/2015   Family history of diabetes mellitus 02/28/2015   H/O alcohol abuse     Scotti Motter W. 06/27/2021, 1:07 PM [place] [address] [facility] [address] [place], [place], [address] Phone: [phone]   Fax:  [phone]  Name: [name] MRN: [id number] Date of Birth: [birth year]

## 2021-06-30 ENCOUNTER — Other Ambulatory Visit: Payer: Self-pay | Admitting: Family Medicine

## 2021-06-30 DIAGNOSIS — Z0000 Encounter for general adult medical examination without abnormal findings: Secondary | ICD-10-CM

## 2021-06-30 DIAGNOSIS — Z1231 Encounter for screening mammogram for malignant neoplasm of breast: Secondary | ICD-10-CM

## 2021-07-01 ENCOUNTER — Ambulatory Visit: Payer: Medicaid Other | Attending: Family Medicine | Admitting: Physical Therapy

## 2021-07-01 ENCOUNTER — Other Ambulatory Visit: Payer: Self-pay

## 2021-07-01 VITALS — BP 101/68 | HR 92

## 2021-07-01 DIAGNOSIS — M6281 Muscle weakness (generalized): Secondary | ICD-10-CM | POA: Diagnosis present

## 2021-07-01 DIAGNOSIS — R2689 Other abnormalities of gait and mobility: Secondary | ICD-10-CM | POA: Insufficient documentation

## 2021-07-01 DIAGNOSIS — R2681 Unsteadiness on feet: Secondary | ICD-10-CM | POA: Diagnosis present

## 2021-07-01 NOTE — Therapy (Signed)
[facility] [address] [place], [place], [address] Phone: [phone]   Fax:  [phone]  Physical Therapy Treatment  Patient Details  Name: [name] MRN: [id number] Date of Birth: Dec 30, 1960 Referring Provider (PT): Magrinat, Sarajane Jews   Encounter Date: 07/01/2021   PT End of Session - 07/01/21 0935     Visit Number 10    Number of Visits 20    Authorization Type Medicaid    Authorization Time Period 12 visits from 06/24/21-08/04/21    Authorization - Visit Number 2    Authorization - Number of Visits 12    PT Start Time 0935    PT Stop Time 1013    PT Time Calculation (min) 38 min    Equipment Utilized During Treatment Gait belt    Activity Tolerance Patient tolerated treatment well;Patient limited by fatigue    Behavior During Therapy [facility] for tasks assessed/performed;Impulsive             Past Medical History:  Diagnosis Date   Alcohol abuse    none since 2013   Anemia    during chemo   Anxiety    At age 46   Arthritis Dx 2010   Bipolar disorder ([place])    Bronchitis    Cancer ([place])    breast mets to brain   CHF (congestive heart failure) (HCC)    Chronic pain    resolved per patient 0/08/67   Complication of anesthesia    Depression    Family history of adverse reaction to anesthesia    MOther had PONV   GERD (gastroesophageal reflux disease)    Headache    hx  migraines   HLD (hyperlipidemia)    Hypertension    Lymphedema of left arm    Opiate dependence ([name])    PONV (postoperative nausea and vomiting)    Port-A-Cath in place    PTSD (post-traumatic stress disorder)    S/P endometrial ablation    in md's office    Past Surgical History:  Procedure Laterality Date   APPLICATION OF CRANIAL NAVIGATION N/A 08/14/2016   Procedure: APPLICATION OF CRANIAL NAVIGATION;  Surgeon: Erline Levine, MD;  Location: [place] [facility];  Service: Neurosurgery;  Laterality: N/A;   BREAST RECONSTRUCTION Left     with silicone implant   COLONOSCOPY W/ POLYPECTOMY     CRANIOTOMY N/A 08/14/2016   Procedure: CRANIOTOMY TUMOR EXCISION WITH Lucky Rathke;  Surgeon: Erline Levine, MD;  Location: [place] [facility];  Service: Neurosurgery;  Laterality: N/A;   FIBULA FRACTURE SURGERY Left    MASTECTOMY Left    RADIOLOGY WITH ANESTHESIA N/A 07/23/2016   Procedure: MRI OF BRAIN WITH AND WITHOUT;  Surgeon: Medication Radiologist, MD;  Location: [place];  Service: Radiology;  Laterality: N/A;   RADIOLOGY WITH ANESTHESIA N/A 09/08/2016   Procedure: MRI OF BRAIN WITH AND WITHOUT CONTRAST;  Surgeon: Medication Radiologist, MD;  Location: [place];  Service: Radiology;  Laterality: N/A;   RADIOLOGY WITH ANESTHESIA N/A 12/10/2016   Procedure: MRI OF BRAIN WITH AND WITHOUT;  Surgeon: Medication Radiologist, MD;  Location: [place];  Service: Radiology;  Laterality: N/A;   RADIOLOGY WITH ANESTHESIA N/A 03/02/2017   Procedure: MRI of BRAIN W and W/OUT CONTRAST;  Surgeon: Medication Radiologist, MD;  Location: [place];  Service: Radiology;  Laterality: N/A;   RADIOLOGY WITH ANESTHESIA N/A 07/29/2017   Procedure: RADIOLOGY WITH ANESTHESIA MRI OF BRAIN WITH AND WITHOUT CONTRAST;  Surgeon: Radiologist, Medication, MD;  Location: [place];  Service: Radiology;  Laterality: N/A;   RADIOLOGY WITH ANESTHESIA N/A 12/07/2017   Procedure: MRI WITH ANESTHESIA OF BRAIN WITH AND WITHOUT CONTRAST;  Surgeon: Radiologist, Medication, MD;  Location: [facility];  Service: Radiology;  Laterality: N/A;   RADIOLOGY WITH ANESTHESIA N/A 04/07/2018   Procedure: MRI OF BRAIN WITH AND WITHOUT CONTRAST;  Surgeon: Radiologist, Medication, MD;  Location: [place];  Service: Radiology;  Laterality: N/A;   RADIOLOGY WITH ANESTHESIA N/A 08/23/2018   Procedure: MRI WITH ANESTHESIA OF THE BRAIN WITH AND WITHOUT;  Surgeon: Radiologist, Medication, MD;  Location: [place] [facility];  Service: Radiology;  Laterality: N/A;   RADIOLOGY WITH ANESTHESIA N/A 01/24/2019   Procedure: MRI OF BRAIN WITH AND WITHOUT  CONTRAST;  Surgeon: Radiologist, Medication, MD;  Location: [facility] [place];  Service: Radiology;  Laterality: N/A;   RADIOLOGY WITH ANESTHESIA N/A 07/06/2019   Procedure: MRI WITH ANESTHESIA OF BRAIN WITH AND WITHOUT CONTRAST;  Surgeon: Radiologist, Medication, MD;  Location: [place];  Service: Radiology;  Laterality: N/A;   RADIOLOGY WITH ANESTHESIA N/A 01/11/2020   Procedure: MRI WITH ANESTHESIA BRAIN WITH AND WITHOUT;  Surgeon: Radiologist, Medication, MD;  Location: [place];  Service: Radiology;  Laterality: N/A;   RADIOLOGY WITH ANESTHESIA N/A 05/21/2020   Procedure: MRI WITH ANESTHESIA OF BRAIN WITH AND WITHOUT CONTRAST;  Surgeon: Radiologist, Medication, MD;  Location: [place];  Service: Radiology;  Laterality: N/A;   RADIOLOGY WITH ANESTHESIA N/A 01/02/2021   Procedure: MRI WITH ANESTHESIA  BRAIN WITH AND WITHOUT CONTRAST,TOTAL SPINE MET SCREENING;  Surgeon: Radiologist, Medication, MD;  Location: [place];  Service: Radiology;  Laterality: N/A;   right power port placement Right     Vitals:   07/01/21 0941  BP: 101/68  Pulse: 92     Subjective Assessment - 07/01/21 0938     Subjective Uneventful weekend.  No falls.  Been dizzy and extremely tired.    Pertinent History PMH: breast cancer with mets to brain/lymph node, alcohol abuse (none since 2013), anemia (during chemo), anxiety, arthritis, bipoloar, bronchitis, headaches (hx of migraines), lymphedema of left arm, PTSD    Limitations Sitting;Standing;Walking    Patient Stated Goals Pt's goals for PT are to get back on my feet more steady.    Currently in Pain? No/denies                               [facility] Adult PT Treatment/Exercise - 07/01/21 0001       Transfers   Transfers Sit to Stand;Stand to Sit    Sit to Stand 5: Supervision;From chair/3-in-1;With upper extremity assist    Sit to Stand Details (indicate cue type and reason) Cues for hand placement    Stand to Sit 5: Supervision;To chair/3-in-1;With upper extremity  assist    Stand to Sit Details Cus for hand placement      Ambulation/Gait   Ambulation/Gait Yes    Ambulation/Gait Assistance 5: Supervision;4: Min guard    Ambulation/Gait Assistance Details 20 ft x 8 reps, (approx 3 minutes), no device and supervision, to simulate household gait for walking program.  Cues to avoid taking too large of a step, as pt has several lateral LOB due to taking steps that are too large.    Ambulation Distance (Feet) 460 Feet   total with rollator, 395 ft in 3 minutes walking with rollator, 1 standing break.  Additional 120 ft with rollator.   Assistive device Rollator;None    Gait Pattern Step-through  pattern;Decreased arm swing - right;Decreased arm swing - left;Decreased step length - right;Decreased step length - left;Poor foot clearance - left;Narrow base of support;Shuffle;Poor foot clearance - right    Ambulation Surface Level;Indoor    Gait Comments Pt needs less cues for foot clearance, with increased awareness noted for decreased shuffling.  She does need cues occasionally to slow pace of gait.      Therapeutic Activites    Therapeutic Activities Other Therapeutic Activities    Other Therapeutic Activities Assessed BP measures today, given pt's reports of occasional dizziness and weakness over the weekend.  Pt describes it more as lightheadedness.  Advised pt to monitor how she is feeling, make sure to stay well hydrated and contact MD if she continues to have this issue.  Discussed POC and scheduling appointments to match approved visits.  Pt in agreement.  Discussed importance of consistency of HEP and progressing at home walking program, as we are likely planning for discharging therapy at end of these approved visits in POC.             Access Code: [email or web address] URL: [email or web address] Date: 07/01/2021 Prepared by: Mady Haagensen  Exercises-Performed and reviewed HEP this visit. Pt return demo understanding.  Had to perform standing  exercises at locked rollator due to busy gym, and educated pt to perform at counter at home.  Encouraged pt to perform reps written in HEP (make sure to perform multiple sets if indicated, as pt reports mostly performing 1 set)  Sit to Stand - 1 x daily - 5 x weekly - 1 sets - 10 reps Seated March with Resistance - 1 x daily - 5 x weekly - 3 sets - 10 reps- (Pt not performing with resistance) Seated Long Arc Quad - 1 x daily - 5 x weekly - 3 sets - 10 reps - 3 sec hold Heel Toe Raises with Counter Support - 1 x daily - 5 x weekly - 2-3 sets - 10 reps Standing Hip Abduction - 1 x daily - 5 x weekly - 2-3 sets - 10 reps Narrow Stance with Counter Support - 1 x daily - 5 x weekly - 1 sets - 3 reps - 20 hold Standing Single Leg Stance with Counter Support - 1 x daily - 5 x weekly - 1 sets - 3 reps - 10 hold       PT Education - 07/01/21 1208     Education Details See therapeutic activity discussion    Person(s) Educated Patient    Methods Explanation    Comprehension Verbalized understanding              PT Short Term Goals - 06/27/21 1309       PT SHORT TERM GOAL #1   Title Pt will be independent with progression of HEP for strength, gait, and balance.  TARGET 07/25/2021    Baseline HEP additions ongoing 06/27/2021    Time 4    Period Weeks    Status Revised      PT SHORT TERM GOAL #2   Title Pt will decrease 5 x sit to stand to less than 13 seconds for improved balance and functional strength.    Baseline 14.58 sec 06/27/21    Time 4    Period Weeks    Status Revised      PT SHORT TERM GOAL #3   Title Pt will improve 6MWT to at least 700 ft for improved gait efficiency for community gait.    Baseline  619 ft with 2 seated breaks    Time 4    Period Weeks    Status New      PT SHORT TERM GOAL #4   Title --    Baseline --    Time --    Period --    Status --      PT SHORT TERM GOAL #5   Title --    Baseline --    Time --    Period --    Status --                PT Long Term Goals - 06/27/21 1312       PT LONG TERM GOAL #1   Title Pt will be independent with final progression of HEP for improved balance, strength, gait to maximize gains made in PT.  08/08/21    Baseline 06/10/2021:  continueing to modify HEP, with walking program added today    Time 6    Period Weeks    Status Revised      PT LONG TERM GOAL #2   Title Pt will improve gait velocity to at least 2.62 ft/sec for improved gait efficiency and safety.    Baseline 2.07 ft/sec at eval (no device); 2.39 ft/sec 06/10/2021    Time 6    Period Weeks    Status On-going      PT LONG TERM GOAL #3   Title Pt will improve TUG score to less than or equal to 15 sec for decreased fall risk.    Baseline 19.19 sec; 20.31 sec 06/09/2021    Time 6    Period Weeks    Status On-going      PT LONG TERM GOAL #4   Title Pt will ambulate up/down 4 steps with bilateral rails supervision for improved safety with household access.    Baseline supervision/min guard, decreased foot clearance 06/27/21    Time 6    Period Weeks    Status On-going      PT LONG TERM GOAL #5   Title Pt will ambulate at least 1000 ft indoor and outdoor surfaces, using rollator, mod I, for improved community gait.    Baseline Pt ambulates 619 ft indoor surfaces, rollator, with 2 seated rest breaks due to fatigue 06/10/21    Time 6    Period Weeks    Status On-going                   Plan - 07/01/21 1209     Clinical Impression Statement Pt reports not increasing to 3 minutes of gait at home yet due to dizziness/weakness over the weeekend.  She does not report this during PT session and vitals are [facility].  Full review of her current HEP, with PT educating pt to perform 3 sets of appropriate exercises (noted in HEP instructions) to get full benefit for strengthening.  She is able to perform 3 sets of 10 of seated LAQ and marching in session today (as she reports she is not able to use the theraband at home).  She  appears to have improved awareness today with foot clearance and heelstrike, needing less cues for foot clearance.  She demonstrates decreased shuffling overall today, but she does tend to speed up with faitgue and shuffling comes on, so PT provides cues for slowed pace with gait.  She will continue to benefit from skilled physical therapy to address lower extremity weakness, balance, and gait towards improved functional mobility and  decreased fall risk.    Personal Factors and Comorbidities Comorbidity 3+;Behavior Pattern    Comorbidities See above    Examination-Activity Limitations Locomotion Level;Transfers;Stairs;Stand    Examination-Participation Restrictions Occupation;Community Activity;Shop    Stability/Clinical Decision Making Evolving/Moderate complexity    Rehab Potential Good    PT Frequency 2x / week    PT Duration 6 weeks   per recert, including 7/41/6384 visit   PT Treatment/Interventions DME Instruction;Neuromuscular re-education;Balance training;Therapeutic exercise;Therapeutic activities;Functional mobility training;Stair training;Gait training;Patient/family education;Orthotic Fit/Training;Passive range of motion;Manual techniques    PT Next Visit Plan Check walking program (how did increase to 3 minutes go?); continue to work gait distances, stair training-step ups, step downs, use of NuStep/Scifit for strengthening lower extremities    PT Home Exercise Plan Access Code: [email or web address]    Consulted and Agree with Plan of Care Patient             Patient will benefit from skilled therapeutic intervention in order to improve the following deficits and impairments:  Abnormal gait, Difficulty walking, Decreased balance, Decreased mobility, Decreased strength  Visit Diagnosis: Other abnormalities of gait and mobility  Muscle weakness (generalized)  Unsteadiness on feet     Problem List Patient Active Problem List   Diagnosis Date Noted   Physical deconditioning 04/14/2021    Balance disorder 04/14/2021   Shoulder disorder 04/14/2021   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   Lymphedema of left upper extremity 12/09/2020   Brain metastases ([place]) 11/01/2019   Thrombocytopenia ([place]) 08/09/2019   Port-A-Cath in place 06/20/2019   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[place]) 03/30/2019   S/P mastectomy, left 12/09/2018   S/P breast reconstruction, left 12/09/2018   S/P radiation therapy 12/09/2018   GERD (gastroesophageal reflux disease) 12/10/2017   Edema 11/09/2017   Sensorineural hearing loss (SNHL) of both ears 11/05/2017   Vitamin D deficiency 01/18/2017   Bipolar I disorder, most recent episode depressed ([place]) 12/08/2016   Adjustment disorder with anxiety 12/06/2016   History of cancer metastatic to brain 07/27/2016   Iron deficiency anemia 06/26/2016   Bone metastases ([place]) 06/03/2016   Primary cancer of lower-inner quadrant of left female breast ([place]) 06/01/2016   Tobacco abuse 07/22/2015   Alcoholism in remission ([place]) 07/22/2015   Malignant neoplasm of female breast ([place]) 07/01/2015   Current smoker 03/28/2015   Goals of care, counseling/discussion 03/28/2015   Seasonal allergies 03/28/2015   Anxiety state 02/28/2015   Fibromyalgia 02/28/2015   Family history of diabetes mellitus 02/28/2015   H/O alcohol abuse     Aniesha Haughn W. 07/01/2021, 12:17 PM [address] [facility] [address] [place], [place], [address] Phone: [phone]   Fax:  [phone]  Name: [name] MRN: [id number] Date of Birth: [birth year]

## 2021-07-02 ENCOUNTER — Ambulatory Visit
Admission: RE | Admit: 2021-07-02 | Discharge: 2021-07-02 | Disposition: A | Discharge status: Home / Self Care | Payer: Medicaid Other | Source: Ambulatory Visit | Attending: Family Medicine | Admitting: Family Medicine

## 2021-07-02 DIAGNOSIS — Z0000 Encounter for general adult medical examination without abnormal findings: Secondary | ICD-10-CM

## 2021-07-02 DIAGNOSIS — Z1231 Encounter for screening mammogram for malignant neoplasm of breast: Secondary | ICD-10-CM

## 2021-07-02 HISTORY — DX: Malignant neoplasm of unspecified site of unspecified female breast: C50.919

## 2021-07-03 ENCOUNTER — Ambulatory Visit: Payer: Medicaid Other | Admitting: Physical Therapy

## 2021-07-03 ENCOUNTER — Other Ambulatory Visit: Payer: Self-pay

## 2021-07-03 ENCOUNTER — Encounter: Payer: Self-pay | Admitting: Physical Therapy

## 2021-07-03 VITALS — BP 100/71 | HR 93

## 2021-07-03 DIAGNOSIS — R2689 Other abnormalities of gait and mobility: Secondary | ICD-10-CM | POA: Diagnosis not present

## 2021-07-03 DIAGNOSIS — M6281 Muscle weakness (generalized): Secondary | ICD-10-CM

## 2021-07-03 NOTE — Therapy (Signed)
[facility] [address] [place], [place], [address] Phone: [phone]   Fax:  [phone]  Physical Therapy Treatment  Patient Details  Name: [name] MRN: [id number] Date of Birth: 08/01/1961 Referring Provider (PT): Magrinat, Sarajane Jews   Encounter Date: 07/03/2021   PT End of Session - 07/03/21 0906     Visit Number 11    Number of Visits 20    Authorization Type Medicaid    Authorization Time Period 12 visits from 06/24/21-08/04/21    Authorization - Visit Number 3    Authorization - Number of Visits 12    PT Start Time 5102    PT Stop Time 0931    PT Time Calculation (min) 39 min    Equipment Utilized During Treatment Gait belt    Activity Tolerance Patient tolerated treatment well;Patient limited by fatigue    Behavior During Therapy [facility] for tasks assessed/performed;Impulsive             Past Medical History:  Diagnosis Date   Alcohol abuse    none since 2013   Anemia    during chemo   Anxiety    At age 64   Arthritis Dx 2010   Bipolar disorder ([place])    Breast cancer ([place])    Bronchitis    Cancer ([place])    breast mets to brain   CHF (congestive heart failure) (HCC)    Chronic pain    resolved per patient 5/85/27   Complication of anesthesia    Depression    Family history of adverse reaction to anesthesia    MOther had PONV   GERD (gastroesophageal reflux disease)    Headache    hx  migraines   HLD (hyperlipidemia)    Hypertension    Lymphedema of left arm    Opiate dependence (HCC)    PONV (postoperative nausea and vomiting)    Port-A-Cath in place    PTSD (post-traumatic stress disorder)    S/P endometrial ablation    in md's office    Past Surgical History:  Procedure Laterality Date   APPLICATION OF CRANIAL NAVIGATION N/A 08/14/2016   Procedure: APPLICATION OF CRANIAL NAVIGATION;  Surgeon: Erline Levine, MD;  Location: [facility];  Service: Neurosurgery;  Laterality: N/A;    BREAST RECONSTRUCTION Left    with silicone implant   COLONOSCOPY W/ POLYPECTOMY     CRANIOTOMY N/A 08/14/2016   Procedure: CRANIOTOMY TUMOR EXCISION WITH Lucky Rathke;  Surgeon: Erline Levine, MD;  Location: [place] [facility];  Service: Neurosurgery;  Laterality: N/A;   FIBULA FRACTURE SURGERY Left    MASTECTOMY Left    RADIOLOGY WITH ANESTHESIA N/A 07/23/2016   Procedure: MRI OF BRAIN WITH AND WITHOUT;  Surgeon: Medication Radiologist, MD;  Location: [facility];  Service: Radiology;  Laterality: N/A;   RADIOLOGY WITH ANESTHESIA N/A 09/08/2016   Procedure: MRI OF BRAIN WITH AND WITHOUT CONTRAST;  Surgeon: Medication Radiologist, MD;  Location: [place];  Service: Radiology;  Laterality: N/A;   RADIOLOGY WITH ANESTHESIA N/A 12/10/2016   Procedure: MRI OF BRAIN WITH AND WITHOUT;  Surgeon: Medication Radiologist, MD;  Location: [place];  Service: Radiology;  Laterality: N/A;   RADIOLOGY WITH ANESTHESIA N/A 03/02/2017   Procedure: MRI of BRAIN W and W/OUT CONTRAST;  Surgeon: Medication Radiologist, MD;  Location: [facility];  Service: Radiology;  Laterality: N/A;   RADIOLOGY WITH ANESTHESIA N/A 07/29/2017   Procedure: RADIOLOGY WITH ANESTHESIA MRI OF BRAIN WITH AND WITHOUT CONTRAST;  Surgeon: Radiologist, Medication,  MD;  Location: [facility];  Service: Radiology;  Laterality: N/A;   RADIOLOGY WITH ANESTHESIA N/A 12/07/2017   Procedure: MRI WITH ANESTHESIA OF BRAIN WITH AND WITHOUT CONTRAST;  Surgeon: Radiologist, Medication, MD;  Location: [facility];  Service: Radiology;  Laterality: N/A;   RADIOLOGY WITH ANESTHESIA N/A 04/07/2018   Procedure: MRI OF BRAIN WITH AND WITHOUT CONTRAST;  Surgeon: Radiologist, Medication, MD;  Location: [facility];  Service: Radiology;  Laterality: N/A;   RADIOLOGY WITH ANESTHESIA N/A 08/23/2018   Procedure: MRI WITH ANESTHESIA OF THE BRAIN WITH AND WITHOUT;  Surgeon: Radiologist, Medication, MD;  Location: [facility];  Service: Radiology;  Laterality: N/A;   RADIOLOGY WITH ANESTHESIA N/A 01/24/2019   Procedure: MRI OF  BRAIN WITH AND WITHOUT CONTRAST;  Surgeon: Radiologist, Medication, MD;  Location: [place];  Service: Radiology;  Laterality: N/A;   RADIOLOGY WITH ANESTHESIA N/A 07/06/2019   Procedure: MRI WITH ANESTHESIA OF BRAIN WITH AND WITHOUT CONTRAST;  Surgeon: Radiologist, Medication, MD;  Location: [facility];  Service: Radiology;  Laterality: N/A;   RADIOLOGY WITH ANESTHESIA N/A 01/11/2020   Procedure: MRI WITH ANESTHESIA BRAIN WITH AND WITHOUT;  Surgeon: Radiologist, Medication, MD;  Location: [facility];  Service: Radiology;  Laterality: N/A;   RADIOLOGY WITH ANESTHESIA N/A 05/21/2020   Procedure: MRI WITH ANESTHESIA OF BRAIN WITH AND WITHOUT CONTRAST;  Surgeon: Radiologist, Medication, MD;  Location: [facility];  Service: Radiology;  Laterality: N/A;   RADIOLOGY WITH ANESTHESIA N/A 01/02/2021   Procedure: MRI WITH ANESTHESIA  BRAIN WITH AND WITHOUT CONTRAST,TOTAL SPINE MET SCREENING;  Surgeon: Radiologist, Medication, MD;  Location: [facility];  Service: Radiology;  Laterality: N/A;   right power port placement Right     Vitals:   07/03/21 0917  BP: 100/71  Pulse: 93     Subjective Assessment - 07/03/21 0852     Subjective Just a busy week with appointments and such.  No falls.    Pertinent History PMH: breast cancer with mets to brain/lymph node, alcohol abuse (none since 2013), anemia (during chemo), anxiety, arthritis, bipoloar, bronchitis, headaches (hx of migraines), lymphedema of left arm, PTSD    Limitations Sitting;Standing;Walking    Patient Stated Goals Pt's goals for PT are to get back on my feet more steady.    Currently in Pain? No/denies                               [facility] Adult PT Treatment/Exercise - 07/03/21 0001       Transfers   Transfers Sit to Stand;Stand to Sit    Sit to Stand 5: Supervision;From chair/3-in-1;With upper extremity assist    Sit to Stand Details Verbal cues for sequencing;Verbal cues for technique    Sit to Stand Details (indicate cue type and reason) Cues  for hand placement    Stand to Sit 5: Supervision;To chair/3-in-1;With upper extremity assist    Stand to Sit Details (indicate cue type and reason) Verbal cues for sequencing;Verbal cues for technique    Stand to Sit Details Cues for hand placement, locking rollator brakes      Ambulation/Gait   Ambulation/Gait Yes    Ambulation/Gait Assistance 5: Supervision;4: Min guard    Ambulation/Gait Assistance Details 20 ft x 12 reps (3 minutes), no device and supervision, to simulate household walking.  Cues to stop briefly after turning, to get balance and then start again.  1 mild LOB today initially and no additional LOB after using the  turn/stop/start again cues.    Ambulation Distance (Feet) 400 Feet   in 3 min with rollator   Assistive device Rollator;None    Gait Pattern Step-through pattern;Decreased arm swing - right;Decreased arm swing - left;Decreased step length - right;Decreased step length - left;Poor foot clearance - left;Narrow base of support;Shuffle;Poor foot clearance - right    Ambulation Surface Level;Indoor    Gait Comments Pt requires 2 standing (brief) rest breaks to stop and reset posture and step length.  2nd stop break, pt is able to verbalize need to do this to avoid shuffling pattern.      Knee/Hip Exercises: Aerobic   Nustep 4 extremities, level 3 for 4 minutes,  with goal >/= 50-60 steps per minute for strengthening and activity tolerance.  3 minute rest, then additional 2 minutes at Level 2, lower extremities only.      Knee/Hip Exercises: Standing   Forward Step Up Both;1 set;10 reps;Hand Hold: 2;Step Height: 6"    Forward Step Up Limitations Cues for technique and foot clearance.    SLS with Vectors Alt step taps to 6" step, 3 sets x 5 reps, cues for foot clearance.             Additional 100 ft x 2 with rollator in and out of therapy session.  Pt does a good job attending to pace, no shuffling noted.         PT Short Term Goals - 06/27/21 1309        PT SHORT TERM GOAL #1   Title Pt will be independent with progression of HEP for strength, gait, and balance.  TARGET 07/25/2021    Baseline HEP additions ongoing 06/27/2021    Time 4    Period Weeks    Status Revised      PT SHORT TERM GOAL #2   Title Pt will decrease 5 x sit to stand to less than 13 seconds for improved balance and functional strength.    Baseline 14.58 sec 06/27/21    Time 4    Period Weeks    Status Revised      PT SHORT TERM GOAL #3   Title Pt will improve 6MWT to at least 700 ft for improved gait efficiency for community gait.    Baseline 619 ft with 2 seated breaks    Time 4    Period Weeks    Status New      PT SHORT TERM GOAL #4   Title --    Baseline --    Time --    Period --    Status --      PT SHORT TERM GOAL #5   Title --    Baseline --    Time --    Period --    Status --               PT Long Term Goals - 06/27/21 1312       PT LONG TERM GOAL #1   Title Pt will be independent with final progression of HEP for improved balance, strength, gait to maximize gains made in PT.  08/08/21    Baseline 06/10/2021:  continueing to modify HEP, with walking program added today    Time 6    Period Weeks    Status Revised      PT LONG TERM GOAL #2   Title Pt will improve gait velocity to at least 2.62 ft/sec for improved gait efficiency and safety.  Baseline 2.07 ft/sec at eval (no device); 2.39 ft/sec 06/10/2021    Time 6    Period Weeks    Status On-going      PT LONG TERM GOAL #3   Title Pt will improve TUG score to less than or equal to 15 sec for decreased fall risk.    Baseline 19.19 sec; 20.31 sec 06/09/2021    Time 6    Period Weeks    Status On-going      PT LONG TERM GOAL #4   Title Pt will ambulate up/down 4 steps with bilateral rails supervision for improved safety with household access.    Baseline supervision/min guard, decreased foot clearance 06/27/21    Time 6    Period Weeks    Status On-going      PT LONG TERM  GOAL #5   Title Pt will ambulate at least 1000 ft indoor and outdoor surfaces, using rollator, mod I, for improved community gait.    Baseline Pt ambulates 619 ft indoor surfaces, rollator, with 2 seated rest breaks due to fatigue 06/10/21    Time 6    Period Weeks    Status On-going                   Plan - 07/03/21 0916     Clinical Impression Statement Pt able to increase time on aerobic machine today, adding 2 minutes with lower extrmeities only.  She is able to recognize need to stop and reset posture/step length to avoid shuffling steps with rollator.  She does report increased fatigue in lower extremities today.  Pt will continue to benefit from skilled physical therapy to further address strength, balance, and gait training towards goals for imrpoved mobility and decreased fall risk.    Personal Factors and Comorbidities Comorbidity 3+;Behavior Pattern    Comorbidities See above    Examination-Activity Limitations Locomotion Level;Transfers;Stairs;Stand    Examination-Participation Restrictions Occupation;Community Activity;Shop    Stability/Clinical Decision Making Evolving/Moderate complexity    Rehab Potential Good    PT Frequency 2x / week    PT Duration 6 weeks   per recert, including 03/26/622 visit   PT Treatment/Interventions DME Instruction;Neuromuscular re-education;Balance training;Therapeutic exercise;Therapeutic activities;Functional mobility training;Stair training;Gait training;Patient/family education;Orthotic Fit/Training;Passive range of motion;Manual techniques    PT Next Visit Plan Check walking program (how did increase to 3 minutes go?); continue to work gait distances, stair training-step ups, step downs, use of NuStep/Scifit for strengthening lower extremities    PT Home Exercise Plan Access Code: [email or web address]    Consulted and Agree with Plan of Care Patient             Patient will benefit from skilled therapeutic intervention in order to improve  the following deficits and impairments:  Abnormal gait, Difficulty walking, Decreased balance, Decreased mobility, Decreased strength  Visit Diagnosis: Muscle weakness (generalized)  Other abnormalities of gait and mobility     Problem List Patient Active Problem List   Diagnosis Date Noted   Physical deconditioning 04/14/2021   Balance disorder 04/14/2021   Shoulder disorder 04/14/2021   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   Lymphedema of left upper extremity 12/09/2020   Brain metastases ([place]) 11/01/2019   Thrombocytopenia ([place]) 08/09/2019   Port-A-Cath in place 06/20/2019   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[place]) 03/30/2019   S/P mastectomy, left 12/09/2018   S/P breast reconstruction, left 12/09/2018   S/P radiation therapy 12/09/2018   GERD (gastroesophageal reflux disease) 12/10/2017   Edema 11/09/2017  Sensorineural hearing loss (SNHL) of both ears 11/05/2017   Vitamin D deficiency 01/18/2017   Bipolar I disorder, most recent episode depressed ([place]) 12/08/2016   Adjustment disorder with anxiety 12/06/2016   History of cancer metastatic to brain 07/27/2016   Iron deficiency anemia 06/26/2016   Bone metastases ([place]) 06/03/2016   Primary cancer of lower-inner quadrant of left female breast ([place]) 06/01/2016   Tobacco abuse 07/22/2015   Alcoholism in remission ([place]) 07/22/2015   Malignant neoplasm of female breast ([place]) 07/01/2015   Current smoker 03/28/2015   Goals of care, counseling/discussion 03/28/2015   Seasonal allergies 03/28/2015   Anxiety state 02/28/2015   Fibromyalgia 02/28/2015   Family history of diabetes mellitus 02/28/2015   H/O alcohol abuse     , W. 07/03/2021, 12:00 PM Frazier Butt., PT   [facility] [address] [place], [place], [address] Phone: [phone]   Fax:  [phone]  Name: [name] MRN: [id number] Date of Birth:  11-Oct-1961

## 2021-07-07 ENCOUNTER — Other Ambulatory Visit (HOSPITAL_COMMUNITY): Payer: Self-pay | Admitting: Cardiology

## 2021-07-09 ENCOUNTER — Ambulatory Visit: Payer: Medicaid Other | Admitting: Physical Therapy

## 2021-07-09 ENCOUNTER — Encounter: Payer: Self-pay | Admitting: Physical Therapy

## 2021-07-09 ENCOUNTER — Other Ambulatory Visit: Payer: Self-pay

## 2021-07-09 DIAGNOSIS — M6281 Muscle weakness (generalized): Secondary | ICD-10-CM

## 2021-07-09 DIAGNOSIS — R2681 Unsteadiness on feet: Secondary | ICD-10-CM

## 2021-07-09 DIAGNOSIS — R2689 Other abnormalities of gait and mobility: Secondary | ICD-10-CM

## 2021-07-09 NOTE — Therapy (Signed)
[facility] [address] [place], [place], [address] Phone: [phone]   Fax:  [phone]  Physical Therapy Treatment  Patient Details  Name: [name] MRN: [id number] Date of Birth: 1961/11/15 Referring Provider (PT): Magrinat, Sarajane Jews   Encounter Date: 07/09/2021   PT End of Session - 07/09/21 0854     Visit Number 12    Number of Visits 20    Authorization Type Medicaid    Authorization Time Period 12 visits from 06/24/21-08/04/21    Authorization - Visit Number 4    Authorization - Number of Visits 12    PT Start Time 0849    PT Stop Time 0930    PT Time Calculation (min) 41 min    Equipment Utilized During Treatment Gait belt    Activity Tolerance Patient tolerated treatment well;Patient limited by fatigue    Behavior During Therapy [facility] for tasks assessed/performed;Impulsive             Past Medical History:  Diagnosis Date   Alcohol abuse    none since 2013   Anemia    during chemo   Anxiety    At age 60   Arthritis Dx 2010   Bipolar disorder ([place])    Breast cancer ([place])    Bronchitis    Cancer ([place])    breast mets to brain   CHF (congestive heart failure) (HCC)    Chronic pain    resolved per patient 3/32/95   Complication of anesthesia    Depression    Family history of adverse reaction to anesthesia    MOther had PONV   GERD (gastroesophageal reflux disease)    Headache    hx  migraines   HLD (hyperlipidemia)    Hypertension    Lymphedema of left arm    Opiate dependence (HCC)    PONV (postoperative nausea and vomiting)    Port-A-Cath in place    PTSD (post-traumatic stress disorder)    S/P endometrial ablation    in md's office    Past Surgical History:  Procedure Laterality Date   APPLICATION OF CRANIAL NAVIGATION N/A 08/14/2016   Procedure: APPLICATION OF CRANIAL NAVIGATION;  Surgeon: Erline Levine, MD;  Location: [facility];  Service: Neurosurgery;  Laterality: N/A;    BREAST RECONSTRUCTION Left    with silicone implant   COLONOSCOPY W/ POLYPECTOMY     CRANIOTOMY N/A 08/14/2016   Procedure: CRANIOTOMY TUMOR EXCISION WITH Lucky Rathke;  Surgeon: Erline Levine, MD;  Location: [facility];  Service: Neurosurgery;  Laterality: N/A;   FIBULA FRACTURE SURGERY Left    MASTECTOMY Left    RADIOLOGY WITH ANESTHESIA N/A 07/23/2016   Procedure: MRI OF BRAIN WITH AND WITHOUT;  Surgeon: Medication Radiologist, MD;  Location: [facility];  Service: Radiology;  Laterality: N/A;   RADIOLOGY WITH ANESTHESIA N/A 09/08/2016   Procedure: MRI OF BRAIN WITH AND WITHOUT CONTRAST;  Surgeon: Medication Radiologist, MD;  Location: [facility];  Service: Radiology;  Laterality: N/A;   RADIOLOGY WITH ANESTHESIA N/A 12/10/2016   Procedure: MRI OF BRAIN WITH AND WITHOUT;  Surgeon: Medication Radiologist, MD;  Location: [facility];  Service: Radiology;  Laterality: N/A;   RADIOLOGY WITH ANESTHESIA N/A 03/02/2017   Procedure: MRI of BRAIN W and W/OUT CONTRAST;  Surgeon: Medication Radiologist, MD;  Location: [facility];  Service: Radiology;  Laterality: N/A;   RADIOLOGY WITH ANESTHESIA N/A 07/29/2017   Procedure: RADIOLOGY WITH ANESTHESIA MRI OF BRAIN WITH AND WITHOUT CONTRAST;  Surgeon: Radiologist, Medication,  MD;  Location: [facility];  Service: Radiology;  Laterality: N/A;   RADIOLOGY WITH ANESTHESIA N/A 12/07/2017   Procedure: MRI WITH ANESTHESIA OF BRAIN WITH AND WITHOUT CONTRAST;  Surgeon: Radiologist, Medication, MD;  Location: [facility];  Service: Radiology;  Laterality: N/A;   RADIOLOGY WITH ANESTHESIA N/A 04/07/2018   Procedure: MRI OF BRAIN WITH AND WITHOUT CONTRAST;  Surgeon: Radiologist, Medication, MD;  Location: [facility];  Service: Radiology;  Laterality: N/A;   RADIOLOGY WITH ANESTHESIA N/A 08/23/2018   Procedure: MRI WITH ANESTHESIA OF THE BRAIN WITH AND WITHOUT;  Surgeon: Radiologist, Medication, MD;  Location: [facility];  Service: Radiology;  Laterality: N/A;   RADIOLOGY WITH ANESTHESIA N/A 01/24/2019   Procedure: MRI OF  BRAIN WITH AND WITHOUT CONTRAST;  Surgeon: Radiologist, Medication, MD;  Location: [facility];  Service: Radiology;  Laterality: N/A;   RADIOLOGY WITH ANESTHESIA N/A 07/06/2019   Procedure: MRI WITH ANESTHESIA OF BRAIN WITH AND WITHOUT CONTRAST;  Surgeon: Radiologist, Medication, MD;  Location: [facility];  Service: Radiology;  Laterality: N/A;   RADIOLOGY WITH ANESTHESIA N/A 01/11/2020   Procedure: MRI WITH ANESTHESIA BRAIN WITH AND WITHOUT;  Surgeon: Radiologist, Medication, MD;  Location: [facility];  Service: Radiology;  Laterality: N/A;   RADIOLOGY WITH ANESTHESIA N/A 05/21/2020   Procedure: MRI WITH ANESTHESIA OF BRAIN WITH AND WITHOUT CONTRAST;  Surgeon: Radiologist, Medication, MD;  Location: [facility];  Service: Radiology;  Laterality: N/A;   RADIOLOGY WITH ANESTHESIA N/A 01/02/2021   Procedure: MRI WITH ANESTHESIA  BRAIN WITH AND WITHOUT CONTRAST,TOTAL SPINE MET SCREENING;  Surgeon: Radiologist, Medication, MD;  Location: [facility];  Service: Radiology;  Laterality: N/A;   right power port placement Right     There were no vitals filed for this visit.   Subjective Assessment - 07/09/21 0851     Subjective Had 2 falls on Sunday. Her foot caught the edge of the tub getting out and she fell forward to the floor. Then as she was trying to get up her legs gave out and she fell backwards. She was able to eventually use her arms to pull herself up. Denies any injuries. Reports her legs were weak the rest of the day into Monday. Feeling a little better today. No pain to report.    Pertinent History PMH: breast cancer with mets to brain/lymph node, alcohol abuse (none since 2013), anemia (during chemo), anxiety, arthritis, bipoloar, bronchitis, headaches (hx of migraines), lymphedema of left arm, PTSD    Limitations Sitting;Standing;Walking    Patient Stated Goals Pt's goals for PT are to get back on my feet more steady.    Pain Score 0-No pain                    [facility] Adult PT Treatment/Exercise - 07/09/21  0855       Transfers   Transfers Sit to Stand;Stand to Sit    Sit to Stand 5: Supervision;From chair/3-in-1;With upper extremity assist    Stand to Sit 5: Supervision;To chair/3-in-1;With upper extremity assist      Ambulation/Gait   Ambulation/Gait Yes    Ambulation/Gait Assistance 5: Supervision    Ambulation/Gait Assistance Details reminder cues for increased step length, to stay closer to rollator at times    Ambulation Distance (Feet) --   around clinic with session   Assistive device Rollator    Gait Pattern Step-through pattern;Decreased arm swing - right;Decreased arm swing - left;Decreased step length - right;Decreased step length - left;Poor foot clearance - left;Narrow base of  support;Shuffle;Poor foot clearance - right    Ambulation Surface Level;Indoor      Neuro Re-ed    Neuro Re-ed Details  for balance/NMR: on airex wiht no UE support- feet apart for EC 30 sec 's x 3 reps, then feet together for 1 rep. min guard to min assist for balance. cues on posture and weight shifting to assist with balance; standing on floor with 2 foam bubbles: alternating forward foot taps, then cross foot taps with min assist/single UE support. cues to slow down, maintain wider base of support and for weight shifting.      Knee/Hip Exercises: Aerobic   Nustep 4 extremities, level 3 for 4 mintues, rest, then 4 additonal minutes with goal >/= 50 steps per minute for strengthening and activity tolerance.      Knee/Hip Exercises: Standing   Forward Step Up Both;1 set;10 reps;Hand Hold: 2;Step Height: 6";Limitations    Forward Step Up Limitations with bil UE support on bars- alternating step ups with contralateral march, min guard assist for safety    Step Down Both;1 set;10 reps;Hand Hold: 2;Step Height: 4";Limitations    Step Down Limitations with bil UE support on bars, alternating step down/back up with cues on technique/form. min guard assist for safety                      PT Short  Term Goals - 06/27/21 1309       PT SHORT TERM GOAL #1   Title Pt will be independent with progression of HEP for strength, gait, and balance.  TARGET 07/25/2021    Baseline HEP additions ongoing 06/27/2021    Time 4    Period Weeks    Status Revised      PT SHORT TERM GOAL #2   Title Pt will decrease 5 x sit to stand to less than 13 seconds for improved balance and functional strength.    Baseline 14.58 sec 06/27/21    Time 4    Period Weeks    Status Revised      PT SHORT TERM GOAL #3   Title Pt will improve 6MWT to at least 700 ft for improved gait efficiency for community gait.    Baseline 619 ft with 2 seated breaks    Time 4    Period Weeks    Status New      PT SHORT TERM GOAL #4   Title --    Baseline --    Time --    Period --    Status --      PT SHORT TERM GOAL #5   Title --    Baseline --    Time --    Period --    Status --               PT Long Term Goals - 06/27/21 1312       PT LONG TERM GOAL #1   Title Pt will be independent with final progression of HEP for improved balance, strength, gait to maximize gains made in PT.  08/08/21    Baseline 06/10/2021:  continueing to modify HEP, with walking program added today    Time 6    Period Weeks    Status Revised      PT LONG TERM GOAL #2   Title Pt will improve gait velocity to at least 2.62 ft/sec for improved gait efficiency and safety.    Baseline 2.07 ft/sec at eval (no device); 2.39  ft/sec 06/10/2021    Time 6    Period Weeks    Status On-going      PT LONG TERM GOAL #3   Title Pt will improve TUG score to less than or equal to 15 sec for decreased fall risk.    Baseline 19.19 sec; 20.31 sec 06/09/2021    Time 6    Period Weeks    Status On-going      PT LONG TERM GOAL #4   Title Pt will ambulate up/down 4 steps with bilateral rails supervision for improved safety with household access.    Baseline supervision/min guard, decreased foot clearance 06/27/21    Time 6    Period Weeks     Status On-going      PT LONG TERM GOAL #5   Title Pt will ambulate at least 1000 ft indoor and outdoor surfaces, using rollator, mod I, for improved community gait.    Baseline Pt ambulates 619 ft indoor surfaces, rollator, with 2 seated rest breaks due to fatigue 06/10/21    Time 6    Period Weeks    Status On-going                   Plan - 07/09/21 0854     Clinical Impression Statement Today's skilled session continued to focus on strengthening and balance training with no significant issues noted, only fatigue. The pt is making steady progress toward goals and should benefit from continued PT to progress toward unmet goals.    Personal Factors and Comorbidities Comorbidity 3+;Behavior Pattern    Comorbidities See above    Examination-Activity Limitations Locomotion Level;Transfers;Stairs;Stand    Examination-Participation Restrictions Occupation;Community Activity;Shop    Stability/Clinical Decision Making Evolving/Moderate complexity    Rehab Potential Good    PT Frequency 2x / week    PT Duration 6 weeks   per recert, including 7/42/5956 visit   PT Treatment/Interventions DME Instruction;Neuromuscular re-education;Balance training;Therapeutic exercise;Therapeutic activities;Functional mobility training;Stair training;Gait training;Patient/family education;Orthotic Fit/Training;Passive range of motion;Manual techniques    PT Next Visit Plan Check walking program (how did increase to 3 minutes go?)- as of 8/10 visit pt had not started this; continue to work gait distances, stair training-step ups, step downs, use of NuStep/Scifit for strengthening lower extremities    PT Home Exercise Plan Access Code: [email or web address]    Consulted and Agree with Plan of Care Patient             Patient will benefit from skilled therapeutic intervention in order to improve the following deficits and impairments:  Abnormal gait, Difficulty walking, Decreased balance, Decreased mobility, Decreased  strength  Visit Diagnosis: Muscle weakness (generalized)  Other abnormalities of gait and mobility  Unsteadiness on feet     Problem List Patient Active Problem List   Diagnosis Date Noted   Physical deconditioning 04/14/2021   Balance disorder 04/14/2021   Shoulder disorder 04/14/2021   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   Lymphedema of left upper extremity 12/09/2020   Brain metastases ([place]) 11/01/2019   Thrombocytopenia ([place]) 08/09/2019   Port-A-Cath in place 06/20/2019   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[place]) 03/30/2019   S/P mastectomy, left 12/09/2018   S/P breast reconstruction, left 12/09/2018   S/P radiation therapy 12/09/2018   GERD (gastroesophageal reflux disease) 12/10/2017   Edema 11/09/2017   Sensorineural hearing loss (SNHL) of both ears 11/05/2017   Vitamin D deficiency 01/18/2017   Bipolar I disorder, most recent episode depressed ([place]) 12/08/2016   Adjustment  disorder with anxiety 12/06/2016   History of cancer metastatic to brain 07/27/2016   Iron deficiency anemia 06/26/2016   Bone metastases ([place]) 06/03/2016   Primary cancer of lower-inner quadrant of left female breast ([place]) 06/01/2016   Tobacco abuse 07/22/2015   Alcoholism in remission ([place]) 07/22/2015   Malignant neoplasm of female breast ([place]) 07/01/2015   Current smoker 03/28/2015   Goals of care, counseling/discussion 03/28/2015   Seasonal allergies 03/28/2015   Anxiety state 02/28/2015   Fibromyalgia 02/28/2015   Family history of diabetes mellitus 02/28/2015   H/O alcohol abuse     [facility] [place], [place], [place] [address], [place], [place] [address] [phone] 07/09/21, 8:08 PM   Name: [name] MRN: [id number] Date of Birth: 1961-09-08

## 2021-07-10 ENCOUNTER — Ambulatory Visit: Payer: Medicaid Other

## 2021-07-10 ENCOUNTER — Ambulatory Visit: Payer: Self-pay | Admitting: *Deleted

## 2021-07-10 ENCOUNTER — Other Ambulatory Visit: Payer: Medicaid Other

## 2021-07-10 NOTE — Telephone Encounter (Signed)
Patient experiencing constipation off and on for 6 month. Patient has been taking miralax everyday and will poop but its loose and messy, patient seeking clinical advice. Patient scheduled an appointment for 07/31/2021   Patient called back and reports she is having worsening symptoms of constipation. Taking miralax daily and colace. Stood hard, "comes out like a snake" , patient agreed "pencil like", some straining reported. Patient now having incontinence at night and wakes up to have BM and already gone.  Denies vomiting, fever.C/o low abdominal cramping prior to BM, abdominal bloating after eating. Patient concerned due to no control of bowels at night no accidents reported during the day. Appt scheduled for 07/31/21. Please advise if earlier appt available. Care advise given. Patient verbalized understanding of care advise and to call back or go to [facility] or ED if symptoms worsen.

## 2021-07-10 NOTE — Telephone Encounter (Signed)
Patient experiencing constipation off and on for 6 month. Patient has been taking miralax everyday and will poop but its loose and messy, patient seeking clinical advice. Patient scheduled an appointment for 07/31/2021  Reason for Disposition  Pencil-like, narrow stools  Answer Assessment - Initial Assessment Questions 1. STOOL PATTERN OR FREQUENCY: "How often do you have a bowel movement (BM)?"  (Normal range: 3 times a day to every 3 days)  "When was your last BM?"       Used to go every morning and now getting worse incontinent at night 2. STRAINING: "Do you have to strain to have a BM?"      At times  3. RECTAL PAIN: "Does your rectum hurt when the stool comes out?" If Yes, ask: "Do you have hemorrhoids? How bad is the pain?"  (Scale 1-10; or mild, moderate, severe)     No pain no hemorroids  4. STOOL COMPOSITION: "Are the stools hard?"      Hard  and "comes out like a snake" 5. BLOOD ON STOOLS: "Has there been any blood on the toilet tissue or on the surface of the BM?" If Yes, ask: "When was the last time?"      No  6. CHRONIC CONSTIPATION: "Is this a new problem for you?"  If no, ask: "How long have you had this problem?" (days, weeks, months)      No but getting worse due to incontinence at night  7. CHANGES IN DIET OR HYDRATION: "Have there been any recent changes in your diet?" "How much fluids are you drinking on a daily basis?"  "How much have you had to drink today?"     no 8. MEDICATIONS: "Have you been taking any new medications?" "Are you taking any narcotic pain medications?" (e.g., Vicodin, Percocet, morphine, Dilaudid)     no 9. LAXATIVES: "Have you been using any stool softeners, laxatives, or enemas?"  If yes, ask "What, how often, and when was the last time?"     Colace , miralax 10. ACTIVITY:  "How much walking do you do every day?"  "Has your activity level decreased in the past week?"        No , trouble walking uses rollator  11. CAUSE: "What do you think is causing  the constipation?"        Not sure, constipation for years  12. OTHER SYMPTOMS: "Do you have any other symptoms?" (e.g., abdominal pain, bloating, fever, vomiting)       Lower abdominal pain prior to BM, abdominal bloating after eating,  13. MEDICAL HISTORY: "Do you have a history of hemorrhoids, rectal fissures, or rectal surgery or rectal abscess?"         no 14. PREGNANCY: "Is there any chance you are pregnant?" "When was your last menstrual period?"       na  Protocols used: Constipation-A-AH

## 2021-07-11 ENCOUNTER — Ambulatory Visit: Payer: Medicaid Other | Admitting: Physical Therapy

## 2021-07-11 ENCOUNTER — Other Ambulatory Visit: Payer: Self-pay

## 2021-07-11 DIAGNOSIS — R2681 Unsteadiness on feet: Secondary | ICD-10-CM

## 2021-07-11 DIAGNOSIS — M6281 Muscle weakness (generalized): Secondary | ICD-10-CM

## 2021-07-11 DIAGNOSIS — R2689 Other abnormalities of gait and mobility: Secondary | ICD-10-CM

## 2021-07-11 NOTE — Therapy (Signed)
[facility] [address] [place], [place], [address] Phone: [phone]   Fax:  [phone]  Physical Therapy Treatment  Patient Details  Name: [name] MRN: [id number] Date of Birth: 1961-08-30 Referring Provider (PT): Magrinat, Sarajane Jews   Encounter Date: 07/11/2021   PT End of Session - 07/11/21 0853     Visit Number 13    Number of Visits 20    Authorization Type Medicaid    Authorization Time Period 12 visits from 06/24/21-08/04/21    Authorization - Visit Number 5    Authorization - Number of Visits 12    PT Start Time 0849    PT Stop Time 0929    PT Time Calculation (min) 40 min    Equipment Utilized During Treatment Gait belt    Activity Tolerance Patient tolerated treatment well;Patient limited by fatigue    Behavior During Therapy [facility] for tasks assessed/performed;Impulsive             Past Medical History:  Diagnosis Date   Alcohol abuse    none since 2013   Anemia    during chemo   Anxiety    At age 26   Arthritis Dx 2010   Bipolar disorder ([place])    Breast cancer ([place])    Bronchitis    Cancer ([place])    breast mets to brain   CHF (congestive heart failure) (HCC)    Chronic pain    resolved per patient 04/17/83   Complication of anesthesia    Depression    Family history of adverse reaction to anesthesia    MOther had PONV   GERD (gastroesophageal reflux disease)    Headache    hx  migraines   HLD (hyperlipidemia)    Hypertension    Lymphedema of left arm    Opiate dependence (HCC)    PONV (postoperative nausea and vomiting)    Port-A-Cath in place    PTSD (post-traumatic stress disorder)    S/P endometrial ablation    in md's office    Past Surgical History:  Procedure Laterality Date   APPLICATION OF CRANIAL NAVIGATION N/A 08/14/2016   Procedure: APPLICATION OF CRANIAL NAVIGATION;  Surgeon: Erline Levine, MD;  Location: [place] [facility];  Service: Neurosurgery;  Laterality: N/A;    BREAST RECONSTRUCTION Left    with silicone implant   COLONOSCOPY W/ POLYPECTOMY     CRANIOTOMY N/A 08/14/2016   Procedure: CRANIOTOMY TUMOR EXCISION WITH Lucky Rathke;  Surgeon: Erline Levine, MD;  Location: [place] [facility];  Service: Neurosurgery;  Laterality: N/A;   FIBULA FRACTURE SURGERY Left    MASTECTOMY Left    RADIOLOGY WITH ANESTHESIA N/A 07/23/2016   Procedure: MRI OF BRAIN WITH AND WITHOUT;  Surgeon: Medication Radiologist, MD;  Location: [facility];  Service: Radiology;  Laterality: N/A;   RADIOLOGY WITH ANESTHESIA N/A 09/08/2016   Procedure: MRI OF BRAIN WITH AND WITHOUT CONTRAST;  Surgeon: Medication Radiologist, MD;  Location: [facility];  Service: Radiology;  Laterality: N/A;   RADIOLOGY WITH ANESTHESIA N/A 12/10/2016   Procedure: MRI OF BRAIN WITH AND WITHOUT;  Surgeon: Medication Radiologist, MD;  Location: [facility];  Service: Radiology;  Laterality: N/A;   RADIOLOGY WITH ANESTHESIA N/A 03/02/2017   Procedure: MRI of BRAIN W and W/OUT CONTRAST;  Surgeon: Medication Radiologist, MD;  Location: [place];  Service: Radiology;  Laterality: N/A;   RADIOLOGY WITH ANESTHESIA N/A 07/29/2017   Procedure: RADIOLOGY WITH ANESTHESIA MRI OF BRAIN WITH AND WITHOUT CONTRAST;  Surgeon: Radiologist, Medication,  MD;  Location: [facility];  Service: Radiology;  Laterality: N/A;   RADIOLOGY WITH ANESTHESIA N/A 12/07/2017   Procedure: MRI WITH ANESTHESIA OF BRAIN WITH AND WITHOUT CONTRAST;  Surgeon: Radiologist, Medication, MD;  Location: [facility];  Service: Radiology;  Laterality: N/A;   RADIOLOGY WITH ANESTHESIA N/A 04/07/2018   Procedure: MRI OF BRAIN WITH AND WITHOUT CONTRAST;  Surgeon: Radiologist, Medication, MD;  Location: [facility];  Service: Radiology;  Laterality: N/A;   RADIOLOGY WITH ANESTHESIA N/A 08/23/2018   Procedure: MRI WITH ANESTHESIA OF THE BRAIN WITH AND WITHOUT;  Surgeon: Radiologist, Medication, MD;  Location: [facility];  Service: Radiology;  Laterality: N/A;   RADIOLOGY WITH ANESTHESIA N/A 01/24/2019   Procedure: MRI OF  BRAIN WITH AND WITHOUT CONTRAST;  Surgeon: Radiologist, Medication, MD;  Location: [place];  Service: Radiology;  Laterality: N/A;   RADIOLOGY WITH ANESTHESIA N/A 07/06/2019   Procedure: MRI WITH ANESTHESIA OF BRAIN WITH AND WITHOUT CONTRAST;  Surgeon: Radiologist, Medication, MD;  Location: [facility];  Service: Radiology;  Laterality: N/A;   RADIOLOGY WITH ANESTHESIA N/A 01/11/2020   Procedure: MRI WITH ANESTHESIA BRAIN WITH AND WITHOUT;  Surgeon: Radiologist, Medication, MD;  Location: [facility];  Service: Radiology;  Laterality: N/A;   RADIOLOGY WITH ANESTHESIA N/A 05/21/2020   Procedure: MRI WITH ANESTHESIA OF BRAIN WITH AND WITHOUT CONTRAST;  Surgeon: Radiologist, Medication, MD;  Location: [facility];  Service: Radiology;  Laterality: N/A;   RADIOLOGY WITH ANESTHESIA N/A 01/02/2021   Procedure: MRI WITH ANESTHESIA  BRAIN WITH AND WITHOUT CONTRAST,TOTAL SPINE MET SCREENING;  Surgeon: Radiologist, Medication, MD;  Location: [facility];  Service: Radiology;  Laterality: N/A;   right power port placement Right     There were no vitals filed for this visit.   Subjective Assessment - 07/11/21 0851     Subjective No new falls. Was wobbly the next day after last therapy session. No new complaints. Does have some lower back and shoulder sorness- pt states she thinks she needs a new pillow as she always wakes up this way and it gets better as the day progresses.    Pertinent History PMH: breast cancer with mets to brain/lymph node, alcohol abuse (none since 2013), anemia (during chemo), anxiety, arthritis, bipoloar, bronchitis, headaches (hx of migraines), lymphedema of left arm, PTSD    Limitations Sitting;Standing;Walking    Patient Stated Goals Pt's goals for PT are to get back on my feet more steady.    Currently in Pain? No/denies    Pain Score 0-No pain                     [facility] Adult PT Treatment/Exercise - 07/11/21 0854       Transfers   Transfers Sit to Stand;Stand to Sit    Sit to Stand 5:  Supervision;From chair/3-in-1;With upper extremity assist    Stand to Sit 5: Supervision;To chair/3-in-1;With upper extremity assist    Comments cues for safety needed as pt turns to sit, especially with fatigued as pt tends to rush- cues to slow down and ensure all the way to surface prior to sitting down.      Ambulation/Gait   Ambulation/Gait Yes    Ambulation/Gait Assistance 5: Supervision    Ambulation/Gait Assistance Details pt needing to stop and "re'set" several times due to shuffling gait pattern. with 1st half of outdoor gait (before 1st seated rest break) pt with very fast gait speed with shuffling steps needing assist to slow down rollator along with cues to  increase step length. 2cd gait rep outdoors after 1st seated rest break was improved with min guard assist- improved (decreased) gait speed with improved step length, only needed to stop 1 time when turning. 3rd gait rep coming back in after 2cd seated rest pt needed min guard assist with reminder cues on step lenght.    Ambulation Distance (Feet) 500 Feet   x1 with 2 seated rest breaks   Assistive device Rollator    Gait Pattern Step-through pattern;Decreased arm swing - right;Decreased arm swing - left;Decreased step length - right;Decreased step length - left;Poor foot clearance - left;Narrow base of support;Shuffle;Poor foot clearance - right    Ambulation Surface Level;Indoor;Unlevel;Outdoor;Paved      Knee/Hip Exercises: Aerobic   Nustep 4 extremities, level 3 for 4 mintues, rest, then 4 additonal minutes with goal >/= 50 steps per minute for strengthening and activity tolerance.      Knee/Hip Exercises: Standing   Functional Squat 1 set;10 reps;Limitations    Functional Squat Limitations UE support for mini squats with cues on form and technique.    Other Standing Knee Exercises with UE support: heel<>toe raises for 10 reps with cues on posture, straight LE's without locking out knees and increased toe lift as able.                       PT Short Term Goals - 06/27/21 1309       PT SHORT TERM GOAL #1   Title Pt will be independent with progression of HEP for strength, gait, and balance.  TARGET 07/25/2021    Baseline HEP additions ongoing 06/27/2021    Time 4    Period Weeks    Status Revised      PT SHORT TERM GOAL #2   Title Pt will decrease 5 x sit to stand to less than 13 seconds for improved balance and functional strength.    Baseline 14.58 sec 06/27/21    Time 4    Period Weeks    Status Revised      PT SHORT TERM GOAL #3   Title Pt will improve 6MWT to at least 700 ft for improved gait efficiency for community gait.    Baseline 619 ft with 2 seated breaks    Time 4    Period Weeks    Status New      PT SHORT TERM GOAL #4   Title --    Baseline --    Time --    Period --    Status --      PT SHORT TERM GOAL #5   Title --    Baseline --    Time --    Period --    Status --               PT Long Term Goals - 06/27/21 1312       PT LONG TERM GOAL #1   Title Pt will be independent with final progression of HEP for improved balance, strength, gait to maximize gains made in PT.  08/08/21    Baseline 06/10/2021:  continueing to modify HEP, with walking program added today    Time 6    Period Weeks    Status Revised      PT LONG TERM GOAL #2   Title Pt will improve gait velocity to at least 2.62 ft/sec for improved gait efficiency and safety.    Baseline 2.07 ft/sec at eval (no device); 2.39 ft/sec  06/10/2021    Time 6    Period Weeks    Status On-going      PT LONG TERM GOAL #3   Title Pt will improve TUG score to less than or equal to 15 sec for decreased fall risk.    Baseline 19.19 sec; 20.31 sec 06/09/2021    Time 6    Period Weeks    Status On-going      PT LONG TERM GOAL #4   Title Pt will ambulate up/down 4 steps with bilateral rails supervision for improved safety with household access.    Baseline supervision/min guard, decreased foot clearance  06/27/21    Time 6    Period Weeks    Status On-going      PT LONG TERM GOAL #5   Title Pt will ambulate at least 1000 ft indoor and outdoor surfaces, using rollator, mod I, for improved community gait.    Baseline Pt ambulates 619 ft indoor surfaces, rollator, with 2 seated rest breaks due to fatigue 06/10/21    Time 6    Period Weeks    Status On-going                   Plan - 07/11/21 0853     Clinical Impression Statement Today's skilled session continued to focus on strengthening and gait training with rollator. Pt still needs to stop and reset her gait/stepping patterns at times. The pt is making steady progress toward goals and should benefit from continued PT to progress toward unmet goals.    Personal Factors and Comorbidities Comorbidity 3+;Behavior Pattern    Comorbidities See above    Examination-Activity Limitations Locomotion Level;Transfers;Stairs;Stand    Examination-Participation Restrictions Occupation;Community Activity;Shop    Stability/Clinical Decision Making Evolving/Moderate complexity    Rehab Potential Good    PT Frequency 2x / week    PT Duration 6 weeks   per recert, including 01/13/864 visit   PT Treatment/Interventions DME Instruction;Neuromuscular re-education;Balance training;Therapeutic exercise;Therapeutic activities;Functional mobility training;Stair training;Gait training;Patient/family education;Orthotic Fit/Training;Passive range of motion;Manual techniques    PT Next Visit Plan Check walking program (how did increase to 3 minutes go?)- as of 8/12 visit pt had not started this; continue to work gait distances, stair training-step ups, step downs, use of NuStep/Scifit for strengthening lower extremities    PT Home Exercise Plan Access Code: [email or web address]    Consulted and Agree with Plan of Care Patient             Patient will benefit from skilled therapeutic intervention in order to improve the following deficits and impairments:  Abnormal  gait, Difficulty walking, Decreased balance, Decreased mobility, Decreased strength  Visit Diagnosis: Muscle weakness (generalized)  Other abnormalities of gait and mobility  Unsteadiness on feet     Problem List Patient Active Problem List   Diagnosis Date Noted   Physical deconditioning 04/14/2021   Balance disorder 04/14/2021   Shoulder disorder 04/14/2021   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[place]) 03/11/2021   Lymphedema of left upper extremity 12/09/2020   Brain metastases ([place]) 11/01/2019   Thrombocytopenia ([place]) 08/09/2019   Port-A-Cath in place 06/20/2019   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[place]) 03/30/2019   S/P mastectomy, left 12/09/2018   S/P breast reconstruction, left 12/09/2018   S/P radiation therapy 12/09/2018   GERD (gastroesophageal reflux disease) 12/10/2017   Edema 11/09/2017   Sensorineural hearing loss (SNHL) of both ears 11/05/2017   Vitamin D deficiency 01/18/2017   Bipolar I disorder, most recent episode  depressed ([place]) 12/08/2016   Adjustment disorder with anxiety 12/06/2016   History of cancer metastatic to brain 07/27/2016   Iron deficiency anemia 06/26/2016   Bone metastases ([place]) 06/03/2016   Primary cancer of lower-inner quadrant of left female breast ([place]) 06/01/2016   Tobacco abuse 07/22/2015   Alcoholism in remission ([place]) 07/22/2015   Malignant neoplasm of female breast ([place]) 07/01/2015   Current smoker 03/28/2015   Goals of care, counseling/discussion 03/28/2015   Seasonal allergies 03/28/2015   Anxiety state 02/28/2015   Fibromyalgia 02/28/2015   Family history of diabetes mellitus 02/28/2015   H/O alcohol abuse     [place], [place], [place] [address] [place], [place] [address] [phone] 07/11/21, 11:10 AM   Name: Ayat Drenning MRN: [id number] Date of Birth: 1961/02/25

## 2021-07-12 NOTE — Telephone Encounter (Signed)
Please place in an 8:10 am slot for virtual appointment. Thanks

## 2021-07-14 NOTE — Telephone Encounter (Signed)
Scheduled for tomorrow.

## 2021-07-15 ENCOUNTER — Encounter: Payer: Self-pay | Admitting: Physical Therapy

## 2021-07-15 ENCOUNTER — Other Ambulatory Visit: Payer: Self-pay | Admitting: Family Medicine

## 2021-07-15 ENCOUNTER — Encounter: Payer: Self-pay | Admitting: Family Medicine

## 2021-07-15 ENCOUNTER — Ambulatory Visit: Payer: Medicaid Other | Attending: Family Medicine | Admitting: Family Medicine

## 2021-07-15 ENCOUNTER — Other Ambulatory Visit: Payer: Self-pay

## 2021-07-15 ENCOUNTER — Ambulatory Visit: Payer: Medicaid Other | Admitting: Physical Therapy

## 2021-07-15 DIAGNOSIS — K5909 Other constipation: Secondary | ICD-10-CM

## 2021-07-15 DIAGNOSIS — M6281 Muscle weakness (generalized): Secondary | ICD-10-CM

## 2021-07-15 DIAGNOSIS — J302 Other seasonal allergic rhinitis: Secondary | ICD-10-CM

## 2021-07-15 DIAGNOSIS — R2681 Unsteadiness on feet: Secondary | ICD-10-CM

## 2021-07-15 DIAGNOSIS — R2689 Other abnormalities of gait and mobility: Secondary | ICD-10-CM | POA: Diagnosis not present

## 2021-07-15 MED ORDER — LUBIPROSTONE 8 MCG PO CAPS: 8.0000 ug | ORAL_CAPSULE | Freq: Two times a day (BID) | ORAL | 1 refills | Status: DC

## 2021-07-15 NOTE — Progress Notes (Signed)
Virtual Visit via Telephone Note  I connected with [name], on 07/15/2021 at 8:20 AM by telephone due to the COVID-19 pandemic and verified that I am speaking with the correct person using two identifiers.   Consent: I discussed the limitations, risks, security and privacy concerns of performing an evaluation and management service by telephone and the availability of in person appointments. I also discussed with the patient that there may be a patient responsible charge related to this service. The patient expressed understanding and agreed to proceed.   Location of Patient: Home  Location of Provider: Clinic   Persons participating in Telemedicine visit: Bella Kennedy Dr. Margarita Rana     History of Present Illness: [name] is a 60 y.o. 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  This has been ongoing for the last 7 months.  She denies use of opioid analgesics, has no hematochezia, no abdominal pain. She takes Miralax daily for her constipation with no much relief. Past Medical History:  Diagnosis Date   Alcohol abuse    none since 2013   Anemia    during chemo   Anxiety    At age 53   Arthritis Dx 2010   Bipolar disorder ([place])    Breast cancer ([place])    Bronchitis    Cancer ([place])    breast mets to brain   CHF (congestive heart failure) ([place])    Chronic pain    resolved per patient [id number]   Complication of anesthesia    Depression    Family history of adverse reaction to anesthesia    MOther had PONV   GERD (gastroesophageal reflux  disease)    Headache    hx  migraines   HLD (hyperlipidemia)    Hypertension    Lymphedema of left arm    Opiate dependence (HCC)    PONV (postoperative nausea and vomiting)    Port-A-Cath in place    PTSD (post-traumatic stress disorder)    S/P endometrial ablation    in md's office   Allergies  Allergen Reactions   Demerol [Meperidine Hcl] Itching and Nausea And Vomiting    INTOLERANCE >  N & V   Erythromycin Rash    Current Outpatient Medications on File Prior to Visit  Medication Sig Dispense Refill   cyclobenzaprine (FLEXERIL) 10 MG tablet TAKE 1 TABLET BY MOUTH TWICE A DAY 60 tablet 1   anastrozole (ARIMIDEX) 1 MG tablet TAKE 1 TABLET BY MOUTH ONCE A DAY 90 tablet 1   azelastine (ASTELIN) 0.1 % nasal spray Place 2 sprays into both nostrils daily. Use in each nostril as directed 30 mL 12   carvedilol (COREG) 3.125 MG tablet TAKE 1 TABLET (3.125 MG TOTAL) BY MOUTH 2 TIMES DAILY FOR 10 DAYS, THEN 2 TABLETS 2 TIMES DAILY. 120 tablet 3   cholecalciferol (VITAMIN D3) 25 MCG (1000 UNIT) tablet Take 2 tablets (2,000 Units total) by mouth daily. 90 tablet 4   docusate sodium (COLACE) 100 MG capsule TAKE 1 CAPSULE BY MOUTH ONCE A DAY 100 capsule 6  fluticasone (FLONASE) 50 MCG/ACT nasal spray SPRAY 2 SPRAYS INTO EACH NOSTRIL EVERY DAY 16 mL 2   ibuprofen (ADVIL) 800 MG tablet TAKE 1 TABLET BY MOUTH TWICE A DAY AS NEEDED FOR MODERATE PAIN. TAKE WITH FOOD. (Patient taking differently: Take 800 mg by mouth in the morning and at bedtime. Take with food.) 60 tablet 6   ketorolac (ACULAR) 0.5 % ophthalmic solution Place 1 drop into the left eye 4 (four) times daily.     lamoTRIgine (LAMICTAL) 100 MG tablet Take 100 mg by mouth every morning.     loratadine (CLARITIN) 10 MG tablet TAKE 1 TABLET BY MOUTH EVERY DAY 90 tablet 3   Lurasidone HCl 60 MG TABS Take 60 mg by mouth daily.     ondansetron (ZOFRAN) 8 MG tablet TAKE 1 TABLET BY MOUTH EVERY 8 HOURS AS NEEDED FOR NAUSEA OR VOMITING. 90  tablet 1   pantoprazole (PROTONIX) 40 MG tablet TAKE 1 TABLET BY MOUTH EVERY DAY 90 tablet 3   polyethylene glycol (MIRALAX / GLYCOLAX) 17 g packet Take 17 g by mouth daily as needed for mild constipation. (Patient taking differently: Take 17 g by mouth every 3 (three) days.) 30 each 6   rosuvastatin (CRESTOR) 10 MG tablet TAKE 1 TABLET BY MOUTH EVERY DAY 90 tablet 3   sulfamethoxazole-trimethoprim (BACTRIM DS) 800-160 MG tablet Take 1 tablet by mouth 2 (two) times daily. 10 tablet 0   traZODone (DESYREL) 100 MG tablet Take 1 tablet (100 mg total) by mouth at bedtime. (Patient taking differently: Take 300 mg by mouth at bedtime.) 30 tablet 0   No current facility-administered medications on file prior to visit.    ROS: See HPI  Observations/Objective: Awake, alert, oriented x3 Not in acute distress Normal mood  CMP Latest Ref Rng & Units 06/05/2021 05/15/2021 04/25/2021  Glucose 70 - 99 mg/dL 100(H) 101(H) 91  BUN 6 - 20 mg/dL '10 12 14  '$ Creatinine 0.44 - 1.00 mg/dL 0.93 0.91 1.02(H)  Sodium 135 - 145 mmol/L 136 135 135  Potassium 3.5 - 5.1 mmol/L 4.3 4.2 4.1  Chloride 98 - 111 mmol/L 98 101 98  CO2 22 - 32 mmol/L '29 27 28  '$ Calcium 8.9 - 10.3 mg/dL 9.4 9.5 9.7  Total Protein 6.5 - 8.1 g/dL 6.8 6.5 6.8  Total Bilirubin 0.3 - 1.2 mg/dL 0.2(L) <0.2(L) 0.2(L)  Alkaline Phos 38 - 126 U/L 78 66 73  AST 15 - 41 U/L 41 23 27  ALT 0 - 44 U/L 37 17 16    Lipid Panel     Component Value Date/Time   CHOL 146 10/16/2020 0848   TRIG 91 10/16/2020 0848   HDL 57 10/16/2020 0848   CHOLHDL 2.6 10/16/2020 0848   VLDL 18 10/16/2020 0848   LDLCALC 71 10/16/2020 0848    Lab Results  Component Value Date   HGBA1C 5.1 12/10/2017    Assessment and Plan: 1. Other constipation Counseled on increasing fiber intake, water MiraLAX has been ineffective Placed on Amitiza and will consider increasing dose if current dose is ineffective - lubiprostone (AMITIZA) 8 MCG capsule; Take 1 capsule (8 mcg  total) by mouth 2 (two) times daily with a meal.  Dispense: 60 capsule; Refill: 1   Follow Up Instructions: Keep previously scheduled appointment   I discussed the assessment and treatment plan with the patient. The patient was provided an opportunity to ask questions and all were answered. The patient agreed with the plan and demonstrated an understanding of  the instructions.   The patient was advised to call back or seek an in-person evaluation if the symptoms worsen or if the condition fails to improve as anticipated.     I provided 11 minutes total of non-face-to-face time during this encounter.   Charlott Rakes, MD, FAAFP. [facility] [place], [place]   07/15/2021, 8:20 AM

## 2021-07-15 NOTE — Telephone Encounter (Signed)
  Notes to clinic:   DX Code Needed    Requested Prescriptions  Pending Prescriptions Disp Refills   fluticasone (FLONASE) 50 MCG/ACT nasal spray [Pharmacy Med Name: FLUTICASONE PROP 50 MCG SPRAY] 16 mL 2    Sig: SPRAY 2 SPRAYS INTO EACH NOSTRIL EVERY DAY     Ear, Nose, and Throat: Nasal Preparations - Corticosteroids Passed - 07/15/2021  8:31 AM      Passed - Valid encounter within last 12 months    Recent Outpatient Visits           Today Other constipation   [place], [place], MD   4 months ago Annual physical exam   [place], [name], MD   1 year ago Acute non-recurrent sinusitis of other sinus   [place], Enobong, MD   1 year ago Other constipation   [place], Enobong, MD   2 years ago Metastatic breast cancer [facility])   [place], Enobong, MD       Future Appointments             In 2 weeks [place], Dionne Bucy, PA-C [place]   In 3 weeks Charlott Rakes, MD [place]

## 2021-07-15 NOTE — Therapy (Signed)
[name] [address] [place], [place], [address] Phone: [phone]   Fax:  [phone]  Physical Therapy Treatment  Patient Details  Name: [name] MRN: [id number] Date of Birth: February 03, 1961 Referring Provider (PT): Magrinat, Sarajane Jews   Encounter Date: 07/15/2021   PT End of Session - 07/15/21 0939     Visit Number 14    Number of Visits 20    Authorization Type Medicaid    Authorization Time Period 12 visits from 06/24/21-08/04/21    Authorization - Visit Number 6    Authorization - Number of Visits 12    PT Start Time [phone]    PT Stop Time 1015    PT Time Calculation (min) 38 min    Equipment Utilized During Treatment Gait belt    Activity Tolerance Patient tolerated treatment well;Patient limited by fatigue    Behavior During Therapy [name] for tasks assessed/performed;Impulsive             Past Medical History:  Diagnosis Date   Alcohol abuse    none since 2013   Anemia    during chemo   Anxiety    At age 82   Arthritis Dx 2010   Bipolar disorder ([name])    Breast cancer ([name])    Bronchitis    Cancer ([name])    breast mets to brain   CHF (congestive heart failure) (HCC)    Chronic pain    resolved per patient 5/00/37   Complication of anesthesia    Depression    Family history of adverse reaction to anesthesia    MOther had PONV   GERD (gastroesophageal reflux disease)    Headache    hx  migraines   HLD (hyperlipidemia)    Hypertension    Lymphedema of left arm    Opiate dependence (HCC)    PONV (postoperative nausea and vomiting)    Port-A-Cath in place    PTSD (post-traumatic stress disorder)    S/P endometrial ablation    in md's office    Past Surgical History:  Procedure Laterality Date   APPLICATION OF CRANIAL NAVIGATION N/A 08/14/2016   Procedure: APPLICATION OF CRANIAL NAVIGATION;  Surgeon: Erline Levine, MD;  Location: [facility];  Service: Neurosurgery;  Laterality: N/A;    BREAST RECONSTRUCTION Left    with silicone implant   COLONOSCOPY W/ POLYPECTOMY     CRANIOTOMY N/A 08/14/2016   Procedure: CRANIOTOMY TUMOR EXCISION WITH Lucky Rathke;  Surgeon: Erline Levine, MD;  Location: [name];  Service: Neurosurgery;  Laterality: N/A;   FIBULA FRACTURE SURGERY Left    MASTECTOMY Left    RADIOLOGY WITH ANESTHESIA N/A 07/23/2016   Procedure: MRI OF BRAIN WITH AND WITHOUT;  Surgeon: Medication Radiologist, MD;  Location: [name];  Service: Radiology;  Laterality: N/A;   RADIOLOGY WITH ANESTHESIA N/A 09/08/2016   Procedure: MRI OF BRAIN WITH AND WITHOUT CONTRAST;  Surgeon: Medication Radiologist, MD;  Location: [name];  Service: Radiology;  Laterality: N/A;   RADIOLOGY WITH ANESTHESIA N/A 12/10/2016   Procedure: MRI OF BRAIN WITH AND WITHOUT;  Surgeon: Medication Radiologist, MD;  Location: [name];  Service: Radiology;  Laterality: N/A;   RADIOLOGY WITH ANESTHESIA N/A 03/02/2017   Procedure: MRI of BRAIN W and W/OUT CONTRAST;  Surgeon: Medication Radiologist, MD;  Location: [name];  Service: Radiology;  Laterality: N/A;   RADIOLOGY WITH ANESTHESIA N/A 07/29/2017   Procedure: RADIOLOGY WITH ANESTHESIA MRI OF BRAIN WITH AND WITHOUT CONTRAST;  Surgeon: Radiologist, Medication,  MD;  Location: [name];  Service: Radiology;  Laterality: N/A;   RADIOLOGY WITH ANESTHESIA N/A 12/07/2017   Procedure: MRI WITH ANESTHESIA OF BRAIN WITH AND WITHOUT CONTRAST;  Surgeon: Radiologist, Medication, MD;  Location: [name];  Service: Radiology;  Laterality: N/A;   RADIOLOGY WITH ANESTHESIA N/A 04/07/2018   Procedure: MRI OF BRAIN WITH AND WITHOUT CONTRAST;  Surgeon: Radiologist, Medication, MD;  Location: [name];  Service: Radiology;  Laterality: N/A;   RADIOLOGY WITH ANESTHESIA N/A 08/23/2018   Procedure: MRI WITH ANESTHESIA OF THE BRAIN WITH AND WITHOUT;  Surgeon: Radiologist, Medication, MD;  Location: [name];  Service: Radiology;  Laterality: N/A;   RADIOLOGY WITH ANESTHESIA N/A 01/24/2019   Procedure: MRI OF  BRAIN WITH AND WITHOUT CONTRAST;  Surgeon: Radiologist, Medication, MD;  Location: [name];  Service: Radiology;  Laterality: N/A;   RADIOLOGY WITH ANESTHESIA N/A 07/06/2019   Procedure: MRI WITH ANESTHESIA OF BRAIN WITH AND WITHOUT CONTRAST;  Surgeon: Radiologist, Medication, MD;  Location: [name];  Service: Radiology;  Laterality: N/A;   RADIOLOGY WITH ANESTHESIA N/A 01/11/2020   Procedure: MRI WITH ANESTHESIA BRAIN WITH AND WITHOUT;  Surgeon: Radiologist, Medication, MD;  Location: [name];  Service: Radiology;  Laterality: N/A;   RADIOLOGY WITH ANESTHESIA N/A 05/21/2020   Procedure: MRI WITH ANESTHESIA OF BRAIN WITH AND WITHOUT CONTRAST;  Surgeon: Radiologist, Medication, MD;  Location: [name];  Service: Radiology;  Laterality: N/A;   RADIOLOGY WITH ANESTHESIA N/A 01/02/2021   Procedure: MRI WITH ANESTHESIA  BRAIN WITH AND WITHOUT CONTRAST,TOTAL SPINE MET SCREENING;  Surgeon: Radiologist, Medication, MD;  Location: [name];  Service: Radiology;  Laterality: N/A;   right power port placement Right     There were no vitals filed for this visit.   Subjective Assessment - 07/15/21 0939     Subjective No changes, no falls.  Walking 3 minutes, 2x/day at home.    Pertinent History PMH: breast cancer with mets to brain/lymph node, alcohol abuse (none since 2013), anemia (during chemo), anxiety, arthritis, bipoloar, bronchitis, headaches (hx of migraines), lymphedema of left arm, PTSD    Limitations Sitting;Standing;Walking    Patient Stated Goals Pt's goals for PT are to get back on my feet more steady.    Currently in Pain? No/denies                               [facility] Adult PT Treatment/Exercise - 07/15/21 0001       Ambulation/Gait   Ambulation/Gait Yes    Ambulation/Gait Assistance 5: Supervision    Ambulation/Gait Assistance Details Pt requires 1 seated rest break after about 400 ft.    Ambulation Distance (Feet) 600 Feet    Assistive device Rollator    Gait Pattern  Step-through pattern;Decreased arm swing - right;Decreased arm swing - left;Decreased step length - right;Decreased step length - left;Poor foot clearance - left;Narrow base of support;Shuffle;Poor foot clearance - right    Pre-Gait Activities Short distance gait no device, 4 minutes (12 laps of about 15 ft) with turn/stop to reset, with min guard.    Gait Comments Pt needs minimal cues for correcting shuffling steps; able to keep larger steps throughout.      Knee/Hip Exercises: Aerobic   Nustep 4 extremities, level 3 for 4 minutes, with goal >/= 50 steps per minute for strengthening and activity tolerance.      Knee/Hip Exercises: Standing   Heel Raises Both;1 set;10 reps  Heel Raises Limitations and toe raises    Side Lunges Right;Left;1 set;10 reps    Side Lunges Limitations UE support and cues for technique    Forward Step Up Both;1 set;10 reps;Hand Hold: 2;Step Height: 6";Limitations    Forward Step Up Limitations BUE support at steps    Step Down Right;1 set;10 reps    Step Down Limitations able to perform RLE step downs only, needs to sit due to fatigue.  Cues throughout to rely more on BLEs and decreased reliance on UE support.    Functional Squat 1 set;10 reps;Limitations    Functional Squat Limitations UE support for mini squats with cues on form and technique.                      PT Short Term Goals - 06/27/21 1309       PT SHORT TERM GOAL #1   Title Pt will be independent with progression of HEP for strength, gait, and balance.  TARGET 07/25/2021    Baseline HEP additions ongoing 06/27/2021    Time 4    Period Weeks    Status Revised      PT SHORT TERM GOAL #2   Title Pt will decrease 5 x sit to stand to less than 13 seconds for improved balance and functional strength.    Baseline 14.58 sec 06/27/21    Time 4    Period Weeks    Status Revised      PT SHORT TERM GOAL #3   Title Pt will improve 6MWT to at least 700 ft for improved gait efficiency for  community gait.    Baseline 619 ft with 2 seated breaks    Time 4    Period Weeks    Status New      PT SHORT TERM GOAL #4   Title --    Baseline --    Time --    Period --    Status --      PT SHORT TERM GOAL #5   Title --    Baseline --    Time --    Period --    Status --               PT Long Term Goals - 06/27/21 1312       PT LONG TERM GOAL #1   Title Pt will be independent with final progression of HEP for improved balance, strength, gait to maximize gains made in PT.  08/08/21    Baseline 06/10/2021:  continueing to modify HEP, with walking program added today    Time 6    Period Weeks    Status Revised      PT LONG TERM GOAL #2   Title Pt will improve gait velocity to at least 2.62 ft/sec for improved gait efficiency and safety.    Baseline 2.07 ft/sec at eval (no device); 2.39 ft/sec 06/10/2021    Time 6    Period Weeks    Status On-going      PT LONG TERM GOAL #3   Title Pt will improve TUG score to less than or equal to 15 sec for decreased fall risk.    Baseline 19.19 sec; 20.31 sec 06/09/2021    Time 6    Period Weeks    Status On-going      PT LONG TERM GOAL #4   Title Pt will ambulate up/down 4 steps with bilateral rails supervision for improved safety with household  access.    Baseline supervision/min guard, decreased foot clearance 06/27/21    Time 6    Period Weeks    Status On-going      PT LONG TERM GOAL #5   Title Pt will ambulate at least 1000 ft indoor and outdoor surfaces, using rollator, mod I, for improved community gait.    Baseline Pt ambulates 619 ft indoor surfaces, rollator, with 2 seated rest breaks due to fatigue 06/10/21    Time 6    Period Weeks    Status On-going                   Plan - 07/15/21 1010     Clinical Impression Statement Pt requires multiple seated rest breaks during standing exercises today, due to c/o muscle fatigue in legs.  Pt reports no changes, just "weak today".  She is able to ambulate  600 ft indoors today with rollator and one rest break (seated) and able to perform gait for short distances x 4 minutes without device.  She does report she has been walking her 3 minutes, 2x/day at home.  She continues to beenfit from skilled PT to further work towards strenght, balance, gait goals.    Personal Factors and Comorbidities Comorbidity 3+;Behavior Pattern    Comorbidities See above    Examination-Activity Limitations Locomotion Level;Transfers;Stairs;Stand    Examination-Participation Restrictions Occupation;Community Activity;Shop    Stability/Clinical Decision Making Evolving/Moderate complexity    Rehab Potential Good    PT Frequency 2x / week    PT Duration 6 weeks   per recert, including 12/05/2692 visit   PT Treatment/Interventions DME Instruction;Neuromuscular re-education;Balance training;Therapeutic exercise;Therapeutic activities;Functional mobility training;Stair training;Gait training;Patient/family education;Orthotic Fit/Training;Passive range of motion;Manual techniques    PT Next Visit Plan continue to work standing strengthening, gait distances, stair training-step ups, step downs, use of NuStep/Scifit for strengthening lower extremities.  Need to check STGs next week.    PT Home Exercise Plan Access Code: [email or web address]    Consulted and Agree with Plan of Care Patient             Patient will benefit from skilled therapeutic intervention in order to improve the following deficits and impairments:  Abnormal gait, Difficulty walking, Decreased balance, Decreased mobility, Decreased strength  Visit Diagnosis: Other abnormalities of gait and mobility  Muscle weakness (generalized)  Unsteadiness on feet     Problem List Patient Active Problem List   Diagnosis Date Noted   Physical deconditioning 04/14/2021   Balance disorder 04/14/2021   Shoulder disorder 04/14/2021   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) 12/08/2016   Adjustment disorder with anxiety 12/06/2016   History of cancer metastatic to brain 07/27/2016   Iron deficiency anemia 06/26/2016   Bone metastases ([place]) 06/03/2016   Primary cancer of lower-inner quadrant of left female breast ([place]) 06/01/2016   Tobacco abuse 07/22/2015   Alcoholism in remission ([place]) 07/22/2015   Malignant neoplasm of female breast ([place]) 07/01/2015  Current smoker 03/28/2015   Goals of care, counseling/discussion 03/28/2015   Seasonal allergies 03/28/2015   Anxiety state 02/28/2015   Fibromyalgia 02/28/2015   Family history of diabetes mellitus 02/28/2015   H/O alcohol abuse     Alzora Ha W. 07/15/2021, 10:15 AM Frazier Butt., PT   [facility] [address] [place], [place], [address] Phone: [phone]   Fax:  [phone]  Name: Deni Berti MRN: [id number] Date of Birth: 05/16/1961

## 2021-07-17 ENCOUNTER — Other Ambulatory Visit: Payer: Self-pay

## 2021-07-17 ENCOUNTER — Inpatient Hospital Stay: Payer: Medicaid Other | Attending: Medical

## 2021-07-17 ENCOUNTER — Inpatient Hospital Stay: Payer: Medicaid Other

## 2021-07-17 VITALS — BP 110/73 | HR 96 | Temp 98.1°F | Resp 16 | Wt 151.5 lb

## 2021-07-17 DIAGNOSIS — D696 Thrombocytopenia, unspecified: Secondary | ICD-10-CM

## 2021-07-17 DIAGNOSIS — C50312 Malignant neoplasm of lower-inner quadrant of left female breast: Secondary | ICD-10-CM | POA: Diagnosis present

## 2021-07-17 DIAGNOSIS — Z5112 Encounter for antineoplastic immunotherapy: Secondary | ICD-10-CM | POA: Diagnosis present

## 2021-07-17 DIAGNOSIS — C7951 Secondary malignant neoplasm of bone: Secondary | ICD-10-CM | POA: Insufficient documentation

## 2021-07-17 DIAGNOSIS — C770 Secondary and unspecified malignant neoplasm of lymph nodes of head, face and neck: Secondary | ICD-10-CM

## 2021-07-17 DIAGNOSIS — Z79899 Other long term (current) drug therapy: Secondary | ICD-10-CM | POA: Diagnosis not present

## 2021-07-17 DIAGNOSIS — Z95828 Presence of other vascular implants and grafts: Secondary | ICD-10-CM

## 2021-07-17 DIAGNOSIS — C50119 Malignant neoplasm of central portion of unspecified female breast: Secondary | ICD-10-CM

## 2021-07-17 LAB — CMP (CANCER CENTER ONLY)
ALT: 27 U/L (ref 0–44)
AST: 28 U/L (ref 15–41)
Albumin: 4.1 g/dL (ref 3.5–5.0)
Alkaline Phosphatase: 83 U/L (ref 38–126)
Anion gap: 11 (ref 5–15)
BUN: 11 mg/dL (ref 6–20)
CO2: 28 mmol/L (ref 22–32)
Calcium: 9.9 mg/dL (ref 8.9–10.3)
Chloride: 99 mmol/L (ref 98–111)
Creatinine: 1.17 mg/dL — ABNORMAL HIGH (ref 0.44–1.00)
GFR, Estimated: 53 mL/min — ABNORMAL LOW (ref >60)
Glucose, Bld: 102 mg/dL — ABNORMAL HIGH (ref 70–99)
Potassium: 3.9 mmol/L (ref 3.5–5.1)
Sodium: 138 mmol/L (ref 135–145)
Total Bilirubin: 0.3 mg/dL (ref 0.3–1.2)
Total Protein: 6.9 g/dL (ref 6.5–8.1)

## 2021-07-17 LAB — CBC WITH DIFFERENTIAL (CANCER CENTER ONLY)
Abs Immature Granulocytes: 0.01 10*3/uL (ref 0.00–0.07)
Basophils Absolute: 0 10*3/uL (ref 0.0–0.1)
Basophils Relative: 1 %
Eosinophils Absolute: 0 10*3/uL (ref 0.0–0.5)
Eosinophils Relative: 0 %
HCT: 34.8 % — ABNORMAL LOW (ref 36.0–46.0)
Hemoglobin: 11.6 g/dL — ABNORMAL LOW (ref 12.0–15.0)
Immature Granulocytes: 0 %
Lymphocytes Relative: 25 %
Lymphs Abs: 1 10*3/uL (ref 0.7–4.0)
MCH: 29.6 pg (ref 26.0–34.0)
MCHC: 33.3 g/dL (ref 30.0–36.0)
MCV: 88.8 fL (ref 80.0–100.0)
Monocytes Absolute: 0.3 10*3/uL (ref 0.1–1.0)
Monocytes Relative: 8 %
Neutro Abs: 2.6 10*3/uL (ref 1.7–7.7)
Neutrophils Relative %: 66 %
Platelet Count: 117 10*3/uL — ABNORMAL LOW (ref 150–400)
RBC: 3.92 MIL/uL (ref 3.87–5.11)
RDW: 13.2 % (ref 11.5–15.5)
WBC Count: 3.9 10*3/uL — ABNORMAL LOW (ref 4.0–10.5)
nRBC: 0 % (ref 0.0–0.2)

## 2021-07-17 MED ORDER — HEPARIN SOD (PORK) LOCK FLUSH 100 UNIT/ML IV SOLN
500.0000 [IU] | Freq: Once | INTRAVENOUS | Status: AC | PRN
  Administered 2021-07-17: 500 [IU]

## 2021-07-17 MED ORDER — SODIUM CHLORIDE 0.9 % IV SOLN: Freq: Once | INTRAVENOUS | Status: AC

## 2021-07-17 MED ORDER — SODIUM CHLORIDE 0.9% FLUSH
10.0000 mL | INTRAVENOUS | Status: DC | PRN
  Administered 2021-07-17: 10 mL via INTRAVENOUS

## 2021-07-17 MED ORDER — ACETAMINOPHEN 325 MG PO TABS
650.0000 mg | ORAL_TABLET | Freq: Once | ORAL | Status: AC
  Administered 2021-07-17: 650 mg via ORAL
  Filled 2021-07-17: qty 2

## 2021-07-17 MED ORDER — SODIUM CHLORIDE 0.9% FLUSH
10.0000 mL | INTRAVENOUS | Status: DC | PRN
  Administered 2021-07-17: 10 mL

## 2021-07-17 MED ORDER — TRASTUZUMAB-DKST CHEMO 150 MG IV SOLR
6.0000 mg/kg | Freq: Once | INTRAVENOUS | Status: AC
  Administered 2021-07-17: 399 mg via INTRAVENOUS
  Filled 2021-07-17: qty 19

## 2021-07-17 NOTE — Patient Instructions (Signed)
[facility]  Discharge Instructions: Thank you for choosing [facility] to provide your oncology and hematology care.   If you have a lab appointment with the Cancer Center, please go directly to the Cancer Center and check in at the registration area.   Wear comfortable clothing and clothing appropriate for easy access to any Portacath or PICC line.   We strive to give you quality time with your provider. You may need to reschedule your appointment if you arrive late (15 or more minutes).  Arriving late affects you and other patients whose appointments are after yours.  Also, if you miss three or more appointments without notifying the office, you may be dismissed from the clinic at the provider's discretion.      For prescription refill requests, have your pharmacy contact our office and allow 72 hours for refills to be completed.    Today you received the following chemotherapy and/or immunotherapy agents : Herceptin     To help prevent nausea and vomiting after your treatment, we encourage you to take your nausea medication as directed.  BELOW ARE SYMPTOMS THAT SHOULD BE REPORTED IMMEDIATELY: *FEVER GREATER THAN 100.4 F (38 C) OR HIGHER *CHILLS OR SWEATING *NAUSEA AND VOMITING THAT IS NOT CONTROLLED WITH YOUR NAUSEA MEDICATION *UNUSUAL SHORTNESS OF BREATH *UNUSUAL BRUISING OR BLEEDING *URINARY PROBLEMS (pain or burning when urinating, or frequent urination) *BOWEL PROBLEMS (unusual diarrhea, constipation, pain near the anus) TENDERNESS IN MOUTH AND THROAT WITH OR WITHOUT PRESENCE OF ULCERS (sore throat, sores in mouth, or a toothache) UNUSUAL RASH, SWELLING OR PAIN  UNUSUAL VAGINAL DISCHARGE OR ITCHING   Items with * indicate a potential emergency and should be followed up as soon as possible or go to the Emergency Department if any problems should occur.  Please show the CHEMOTHERAPY ALERT CARD or IMMUNOTHERAPY ALERT CARD at check-in to  the Emergency Department and triage nurse.  Should you have questions after your visit or need to cancel or reschedule your appointment, please contact [facility]  Dept: [phone]  and follow the prompts.  Office hours are 8:00 a.m. to 4:30 p.m. Monday - Friday. Please note that voicemails left after 4:00 p.m. may not be returned until the following business day.  We are closed weekends and major holidays. You have access to a nurse at all times for urgent questions. Please call the main number to the clinic Dept: [phone] and follow the prompts.   For any non-urgent questions, you may also contact your provider using MyChart. We now offer e-Visits for anyone 18 and older to request care online for non-urgent symptoms. For details visit [email or web address].   Also download the MyChart app! Go to the app store, search "MyChart", open the app, select , and log in with your MyChart username and password.  Due to Covid, a mask is required upon entering the hospital/clinic. If you do not have a mask, one will be given to you upon arrival. For doctor visits, patients may have 1 support person aged 18 or older with them. For treatment visits, patients cannot have anyone with them due to current Covid guidelines and our immunocompromised population.   

## 2021-07-18 ENCOUNTER — Ambulatory Visit: Payer: Medicaid Other | Admitting: Physical Therapy

## 2021-07-18 ENCOUNTER — Encounter: Payer: Self-pay | Admitting: Physical Therapy

## 2021-07-18 DIAGNOSIS — R2689 Other abnormalities of gait and mobility: Secondary | ICD-10-CM | POA: Diagnosis not present

## 2021-07-18 DIAGNOSIS — M6281 Muscle weakness (generalized): Secondary | ICD-10-CM

## 2021-07-18 DIAGNOSIS — R2681 Unsteadiness on feet: Secondary | ICD-10-CM

## 2021-07-18 NOTE — Therapy (Signed)
[facility] [address] [place], [place], [address] Phone: [phone]   Fax:  [phone]  Physical Therapy Treatment  Patient Details  Name: [name] MRN: [id number] Date of Birth: March 17, 1961 Referring Provider (PT): Magrinat, Sarajane Jews   Encounter Date: 07/18/2021   PT End of Session - 07/18/21 0944     Visit Number 15    Number of Visits 20    Authorization Type Medicaid    Authorization Time Period 12 visits from 06/24/21-08/04/21    Authorization - Visit Number 7    Authorization - Number of Visits 12    PT Start Time 4665    PT Stop Time 1013    PT Time Calculation (min) 39 min    Equipment Utilized During Treatment Gait belt    Activity Tolerance Patient tolerated treatment well;Patient limited by fatigue;Patient limited by pain    Behavior During Therapy [facility] for tasks assessed/performed;Impulsive             Past Medical History:  Diagnosis Date   Alcohol abuse    none since 2013   Anemia    during chemo   Anxiety    At age 89   Arthritis Dx 2010   Bipolar disorder ([place])    Breast cancer ([place])    Bronchitis    Cancer ([place])    breast mets to brain   CHF (congestive heart failure) (HCC)    Chronic pain    resolved per patient 9/93/57   Complication of anesthesia    Depression    Family history of adverse reaction to anesthesia    MOther had PONV   GERD (gastroesophageal reflux disease)    Headache    hx  migraines   HLD (hyperlipidemia)    Hypertension    Lymphedema of left arm    Opiate dependence (HCC)    PONV (postoperative nausea and vomiting)    Port-A-Cath in place    PTSD (post-traumatic stress disorder)    S/P endometrial ablation    in md's office    Past Surgical History:  Procedure Laterality Date   APPLICATION OF CRANIAL NAVIGATION N/A 08/14/2016   Procedure: APPLICATION OF CRANIAL NAVIGATION;  Surgeon: Erline Levine, MD;  Location: [facility];  Service:  Neurosurgery;  Laterality: N/A;   BREAST RECONSTRUCTION Left    with silicone implant   COLONOSCOPY W/ POLYPECTOMY     CRANIOTOMY N/A 08/14/2016   Procedure: CRANIOTOMY TUMOR EXCISION WITH Lucky Rathke;  Surgeon: Erline Levine, MD;  Location: [facility];  Service: Neurosurgery;  Laterality: N/A;   FIBULA FRACTURE SURGERY Left    MASTECTOMY Left    RADIOLOGY WITH ANESTHESIA N/A 07/23/2016   Procedure: MRI OF BRAIN WITH AND WITHOUT;  Surgeon: Medication Radiologist, MD;  Location: [facility];  Service: Radiology;  Laterality: N/A;   RADIOLOGY WITH ANESTHESIA N/A 09/08/2016   Procedure: MRI OF BRAIN WITH AND WITHOUT CONTRAST;  Surgeon: Medication Radiologist, MD;  Location: [facility];  Service: Radiology;  Laterality: N/A;   RADIOLOGY WITH ANESTHESIA N/A 12/10/2016   Procedure: MRI OF BRAIN WITH AND WITHOUT;  Surgeon: Medication Radiologist, MD;  Location: [facility];  Service: Radiology;  Laterality: N/A;   RADIOLOGY WITH ANESTHESIA N/A 03/02/2017   Procedure: MRI of BRAIN W and W/OUT CONTRAST;  Surgeon: Medication Radiologist, MD;  Location: [facility];  Service: Radiology;  Laterality: N/A;   RADIOLOGY WITH ANESTHESIA N/A 07/29/2017   Procedure: RADIOLOGY WITH ANESTHESIA MRI OF BRAIN WITH AND WITHOUT CONTRAST;  Surgeon: Radiologist, Medication, MD;  Location: [facility];  Service: Radiology;  Laterality: N/A;   RADIOLOGY WITH ANESTHESIA N/A 12/07/2017   Procedure: MRI WITH ANESTHESIA OF BRAIN WITH AND WITHOUT CONTRAST;  Surgeon: Radiologist, Medication, MD;  Location: [facility];  Service: Radiology;  Laterality: N/A;   RADIOLOGY WITH ANESTHESIA N/A 04/07/2018   Procedure: MRI OF BRAIN WITH AND WITHOUT CONTRAST;  Surgeon: Radiologist, Medication, MD;  Location: [facility];  Service: Radiology;  Laterality: N/A;   RADIOLOGY WITH ANESTHESIA N/A 08/23/2018   Procedure: MRI WITH ANESTHESIA OF THE BRAIN WITH AND WITHOUT;  Surgeon: Radiologist, Medication, MD;  Location: [facility];  Service: Radiology;  Laterality: N/A;   RADIOLOGY WITH ANESTHESIA  N/A 01/24/2019   Procedure: MRI OF BRAIN WITH AND WITHOUT CONTRAST;  Surgeon: Radiologist, Medication, MD;  Location: [facility];  Service: Radiology;  Laterality: N/A;   RADIOLOGY WITH ANESTHESIA N/A 07/06/2019   Procedure: MRI WITH ANESTHESIA OF BRAIN WITH AND WITHOUT CONTRAST;  Surgeon: Radiologist, Medication, MD;  Location: [facility];  Service: Radiology;  Laterality: N/A;   RADIOLOGY WITH ANESTHESIA N/A 01/11/2020   Procedure: MRI WITH ANESTHESIA BRAIN WITH AND WITHOUT;  Surgeon: Radiologist, Medication, MD;  Location: [facility];  Service: Radiology;  Laterality: N/A;   RADIOLOGY WITH ANESTHESIA N/A 05/21/2020   Procedure: MRI WITH ANESTHESIA OF BRAIN WITH AND WITHOUT CONTRAST;  Surgeon: Radiologist, Medication, MD;  Location: [facility];  Service: Radiology;  Laterality: N/A;   RADIOLOGY WITH ANESTHESIA N/A 01/02/2021   Procedure: MRI WITH ANESTHESIA  BRAIN WITH AND WITHOUT CONTRAST,TOTAL SPINE MET SCREENING;  Surgeon: Radiologist, Medication, MD;  Location: [facility];  Service: Radiology;  Laterality: N/A;   right power port placement Right     There were no vitals filed for this visit.   Subjective Assessment - 07/18/21 0936     Subjective Reports having increased Sciatica pain past few days (since Wed). Took some Ibuprofen and Flexeril, not helping much. No falls.    Pertinent History PMH: breast cancer with mets to brain/lymph node, alcohol abuse (none since 2013), anemia (during chemo), anxiety, arthritis, bipoloar, bronchitis, headaches (hx of migraines), lymphedema of left arm, PTSD    Limitations Sitting;Standing;Walking    Patient Stated Goals Pt's goals for PT are to get back on my feet more steady.    Currently in Pain? Yes    Pain Score 8     Pain Location Back    Pain Orientation Lower;Right    Pain Type Acute pain    Pain Radiating Towards into buttocks on right side    Pain Onset In the past 7 days    Pain Frequency Constant   varies in intensity   Aggravating Factors  increased  activity/walking    Pain Relieving Factors resting, pain meds some                    [facility] Adult PT Treatment/Exercise - 07/18/21 0939       Transfers   Transfers Sit to Stand;Stand to Sit    Sit to Stand 5: Supervision;From chair/3-in-1;With upper extremity assist    Stand to Sit 5: Supervision;To chair/3-in-1;With upper extremity assist      Ambulation/Gait   Ambulation/Gait Yes    Ambulation/Gait Assistance 5: Supervision    Ambulation/Gait Assistance Details gait around track after stretching/core ex's with pt reporting decreased pain. cues to scan enviroment fully as pt running into obstacles on left 3 times.    Ambulation Distance (Feet) 115 Feet   x1,  plus around clinic with session   Assistive device Rollator    Gait Pattern Step-through pattern;Decreased arm swing - right;Decreased arm swing - left;Decreased step length - right;Decreased step length - left;Poor foot clearance - left;Narrow base of support;Shuffle;Poor foot clearance - right    Ambulation Surface Level;Indoor      Exercises   Exercises Other Exercises    Other Exercises  supine on mat table: single knee to chest for 30 sec's x 2 reps on each side; nerve glides on right LE for 10 reps with assist needed to support her leg in the position while pt moved foot; with red pball: lower trunk rotation stretch with assist for 30 sec holds x 3 each side. then had pt work on active lower trunk rotation with abd bracing for 10 reps each way. then had pt perform bridges for 10 reps with cues for increased pelvic lifting and occasional assist for ball stability. ended with feet on ball for HS curls with assist needed to stabilize feet on ball for 10 reps.      Knee/Hip Exercises: Aerobic   Nustep Level 2 (decreased resistance today due to increased pain) with UE/LE"s for 4 minutes work, rest, then 4 additional minutes of work with goal >/= 40 steps per minute for strengthening and activity tolerance.                       PT Short Term Goals - 06/27/21 1309       PT SHORT TERM GOAL #1   Title Pt will be independent with progression of HEP for strength, gait, and balance.  TARGET 07/25/2021    Baseline HEP additions ongoing 06/27/2021    Time 4    Period Weeks    Status Revised      PT SHORT TERM GOAL #2   Title Pt will decrease 5 x sit to stand to less than 13 seconds for improved balance and functional strength.    Baseline 14.58 sec 06/27/21    Time 4    Period Weeks    Status Revised      PT SHORT TERM GOAL #3   Title Pt will improve 6MWT to at least 700 ft for improved gait efficiency for community gait.    Baseline 619 ft with 2 seated breaks    Time 4    Period Weeks    Status New      PT SHORT TERM GOAL #4   Title --    Baseline --    Time --    Period --    Status --      PT SHORT TERM GOAL #5   Title --    Baseline --    Time --    Period --    Status --               PT Long Term Goals - 06/27/21 1312       PT LONG TERM GOAL #1   Title Pt will be independent with final progression of HEP for improved balance, strength, gait to maximize gains made in PT.  08/08/21    Baseline 06/10/2021:  continueing to modify HEP, with walking program added today    Time 6    Period Weeks    Status Revised      PT LONG TERM GOAL #2   Title Pt will improve gait velocity to at least 2.62 ft/sec for improved gait efficiency and safety.  Baseline 2.07 ft/sec at eval (no device); 2.39 ft/sec 06/10/2021    Time 6    Period Weeks    Status On-going      PT LONG TERM GOAL #3   Title Pt will improve TUG score to less than or equal to 15 sec for decreased fall risk.    Baseline 19.19 sec; 20.31 sec 06/09/2021    Time 6    Period Weeks    Status On-going      PT LONG TERM GOAL #4   Title Pt will ambulate up/down 4 steps with bilateral rails supervision for improved safety with household access.    Baseline supervision/min guard, decreased foot clearance 06/27/21     Time 6    Period Weeks    Status On-going      PT LONG TERM GOAL #5   Title Pt will ambulate at least 1000 ft indoor and outdoor surfaces, using rollator, mod I, for improved community gait.    Baseline Pt ambulates 619 ft indoor surfaces, rollator, with 2 seated rest breaks due to fatigue 06/10/21    Time 6    Period Weeks    Status On-going                   Plan - 07/18/21 0944     Clinical Impression Statement Today's skilled session continued to focus on strengthening and gait with rollator. Pt with increased pain in back/right LE limiting her gait in session. Did report feeling better after stretches/core ex's on mat table. The pt is progressing toward goals and should benefit from continued PT to progress toward unmet goals.    Personal Factors and Comorbidities Comorbidity 3+;Behavior Pattern    Comorbidities See above    Examination-Activity Limitations Locomotion Level;Transfers;Stairs;Stand    Examination-Participation Restrictions Occupation;Community Activity;Shop    Stability/Clinical Decision Making Evolving/Moderate complexity    Rehab Potential Good    PT Frequency 2x / week    PT Duration 6 weeks   per recert, including 4/35/6861 visit   PT Treatment/Interventions DME Instruction;Neuromuscular re-education;Balance training;Therapeutic exercise;Therapeutic activities;Functional mobility training;Stair training;Gait training;Patient/family education;Orthotic Fit/Training;Passive range of motion;Manual techniques    PT Next Visit Plan STGs due. How is pain, how was it after last session. continue to work standing strengthening, gait distances, stair training-step ups, step downs, use of NuStep/Scifit for strengthening lower extremities.    PT Home Exercise Plan Access Code: [email or web address]    Consulted and Agree with Plan of Care Patient             Patient will benefit from skilled therapeutic intervention in order to improve the following deficits and impairments:   Abnormal gait, Difficulty walking, Decreased balance, Decreased mobility, Decreased strength  Visit Diagnosis: Other abnormalities of gait and mobility  Muscle weakness (generalized)  Unsteadiness on feet     Problem List Patient Active Problem List   Diagnosis Date Noted   Physical deconditioning 04/14/2021   Balance disorder 04/14/2021   Shoulder disorder 04/14/2021   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   Lymphedema of left upper extremity 12/09/2020   Brain metastases ([place]) 11/01/2019   Thrombocytopenia ([place]) 08/09/2019   Port-A-Cath in place 06/20/2019   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[place]) 03/30/2019   S/P mastectomy, left 12/09/2018   S/P breast reconstruction, left 12/09/2018   S/P radiation therapy 12/09/2018   GERD (gastroesophageal reflux disease) 12/10/2017   Edema 11/09/2017   Sensorineural hearing loss (SNHL) of both ears 11/05/2017   Vitamin D  deficiency 01/18/2017   Bipolar I disorder, most recent episode depressed ([place]) 12/08/2016   Adjustment disorder with anxiety 12/06/2016   History of cancer metastatic to brain 07/27/2016   Iron deficiency anemia 06/26/2016   Bone metastases ([place]) 06/03/2016   Primary cancer of lower-inner quadrant of left female breast ([place]) 06/01/2016   Tobacco abuse 07/22/2015   Alcoholism in remission ([place]) 07/22/2015   Malignant neoplasm of female breast ([place]) 07/01/2015   Current smoker 03/28/2015   Goals of care, counseling/discussion 03/28/2015   Seasonal allergies 03/28/2015   Anxiety state 02/28/2015   Fibromyalgia 02/28/2015   Family history of diabetes mellitus 02/28/2015   H/O alcohol abuse     [place], [place], [place] [address], [place], [place] [address] [phone] 07/18/21, 10:16 AM   Name: [name] MRN: [id number] Date of Birth: [birth year]

## 2021-07-21 ENCOUNTER — Ambulatory Visit: Payer: Medicaid Other | Admitting: Physical Therapy

## 2021-07-21 ENCOUNTER — Other Ambulatory Visit: Payer: Self-pay

## 2021-07-21 ENCOUNTER — Other Ambulatory Visit (HOSPITAL_COMMUNITY): Payer: Medicaid Other

## 2021-07-21 ENCOUNTER — Encounter: Payer: Self-pay | Admitting: Physical Therapy

## 2021-07-21 DIAGNOSIS — M6281 Muscle weakness (generalized): Secondary | ICD-10-CM

## 2021-07-21 DIAGNOSIS — R2689 Other abnormalities of gait and mobility: Secondary | ICD-10-CM | POA: Diagnosis not present

## 2021-07-21 NOTE — Therapy (Signed)
[facility] [address] [place], [place], [address] Phone: [phone]   Fax:  [phone]  Physical Therapy Treatment  Patient Details  Name: [name] MRN: [id number] Date of Birth: 1961/07/17 Referring Provider (PT): Magrinat, Sarajane Jews   Encounter Date: 07/21/2021   PT End of Session - 07/21/21 0940     Visit Number 16    Number of Visits 20    Authorization Type Medicaid    Authorization Time Period 12 visits from 06/24/21-08/04/21    Authorization - Visit Number 8    Authorization - Number of Visits 12    PT Start Time 0850    PT Stop Time 0930    PT Time Calculation (min) 40 min    Equipment Utilized During Treatment Gait belt    Activity Tolerance Patient tolerated treatment well;Patient limited by fatigue    Behavior During Therapy [facility] for tasks assessed/performed;Impulsive             Past Medical History:  Diagnosis Date   Alcohol abuse    none since 2013   Anemia    during chemo   Anxiety    At age 80   Arthritis Dx 2010   Bipolar disorder ([place])    Breast cancer ([place])    Bronchitis    Cancer ([place])    breast mets to brain   CHF (congestive heart failure) (HCC)    Chronic pain    resolved per patient 01/02/32   Complication of anesthesia    Depression    Family history of adverse reaction to anesthesia    MOther had PONV   GERD (gastroesophageal reflux disease)    Headache    hx  migraines   HLD (hyperlipidemia)    Hypertension    Lymphedema of left arm    Opiate dependence (HCC)    PONV (postoperative nausea and vomiting)    Port-A-Cath in place    PTSD (post-traumatic stress disorder)    S/P endometrial ablation    in md's office    Past Surgical History:  Procedure Laterality Date   APPLICATION OF CRANIAL NAVIGATION N/A 08/14/2016   Procedure: APPLICATION OF CRANIAL NAVIGATION;  Surgeon: Erline Levine, MD;  Location: [facility] [place];  Service: Neurosurgery;  Laterality: N/A;    BREAST RECONSTRUCTION Left    with silicone implant   COLONOSCOPY W/ POLYPECTOMY     CRANIOTOMY N/A 08/14/2016   Procedure: CRANIOTOMY TUMOR EXCISION WITH Lucky Rathke;  Surgeon: Erline Levine, MD;  Location: [facility];  Service: Neurosurgery;  Laterality: N/A;   FIBULA FRACTURE SURGERY Left    MASTECTOMY Left    RADIOLOGY WITH ANESTHESIA N/A 07/23/2016   Procedure: MRI OF BRAIN WITH AND WITHOUT;  Surgeon: Medication Radiologist, MD;  Location: [facility];  Service: Radiology;  Laterality: N/A;   RADIOLOGY WITH ANESTHESIA N/A 09/08/2016   Procedure: MRI OF BRAIN WITH AND WITHOUT CONTRAST;  Surgeon: Medication Radiologist, MD;  Location: [facility];  Service: Radiology;  Laterality: N/A;   RADIOLOGY WITH ANESTHESIA N/A 12/10/2016   Procedure: MRI OF BRAIN WITH AND WITHOUT;  Surgeon: Medication Radiologist, MD;  Location: [facility];  Service: Radiology;  Laterality: N/A;   RADIOLOGY WITH ANESTHESIA N/A 03/02/2017   Procedure: MRI of BRAIN W and W/OUT CONTRAST;  Surgeon: Medication Radiologist, MD;  Location: [facility];  Service: Radiology;  Laterality: N/A;   RADIOLOGY WITH ANESTHESIA N/A 07/29/2017   Procedure: RADIOLOGY WITH ANESTHESIA MRI OF BRAIN WITH AND WITHOUT CONTRAST;  Surgeon: Radiologist, Medication,  MD;  Location: [facility];  Service: Radiology;  Laterality: N/A;   RADIOLOGY WITH ANESTHESIA N/A 12/07/2017   Procedure: MRI WITH ANESTHESIA OF BRAIN WITH AND WITHOUT CONTRAST;  Surgeon: Radiologist, Medication, MD;  Location: [facility];  Service: Radiology;  Laterality: N/A;   RADIOLOGY WITH ANESTHESIA N/A 04/07/2018   Procedure: MRI OF BRAIN WITH AND WITHOUT CONTRAST;  Surgeon: Radiologist, Medication, MD;  Location: [facility];  Service: Radiology;  Laterality: N/A;   RADIOLOGY WITH ANESTHESIA N/A 08/23/2018   Procedure: MRI WITH ANESTHESIA OF THE BRAIN WITH AND WITHOUT;  Surgeon: Radiologist, Medication, MD;  Location: [facility];  Service: Radiology;  Laterality: N/A;   RADIOLOGY WITH ANESTHESIA N/A 01/24/2019   Procedure: MRI OF  BRAIN WITH AND WITHOUT CONTRAST;  Surgeon: Radiologist, Medication, MD;  Location: [facility];  Service: Radiology;  Laterality: N/A;   RADIOLOGY WITH ANESTHESIA N/A 07/06/2019   Procedure: MRI WITH ANESTHESIA OF BRAIN WITH AND WITHOUT CONTRAST;  Surgeon: Radiologist, Medication, MD;  Location: [facility];  Service: Radiology;  Laterality: N/A;   RADIOLOGY WITH ANESTHESIA N/A 01/11/2020   Procedure: MRI WITH ANESTHESIA BRAIN WITH AND WITHOUT;  Surgeon: Radiologist, Medication, MD;  Location: [facility];  Service: Radiology;  Laterality: N/A;   RADIOLOGY WITH ANESTHESIA N/A 05/21/2020   Procedure: MRI WITH ANESTHESIA OF BRAIN WITH AND WITHOUT CONTRAST;  Surgeon: Radiologist, Medication, MD;  Location: [facility];  Service: Radiology;  Laterality: N/A;   RADIOLOGY WITH ANESTHESIA N/A 01/02/2021   Procedure: MRI WITH ANESTHESIA  BRAIN WITH AND WITHOUT CONTRAST,TOTAL SPINE MET SCREENING;  Surgeon: Radiologist, Medication, MD;  Location: [facility];  Service: Radiology;  Laterality: N/A;   right power port placement Right     There were no vitals filed for this visit.   Subjective Assessment - 07/21/21 0853     Subjective Sciatica pain has eased off; it's not like it was on Friday.  Sometimes it just comes on like that.  No falls since last visit.    Pertinent History PMH: breast cancer with mets to brain/lymph node, alcohol abuse (none since 2013), anemia (during chemo), anxiety, arthritis, bipoloar, bronchitis, headaches (hx of migraines), lymphedema of left arm, PTSD    Limitations Sitting;Standing;Walking    Patient Stated Goals Pt's goals for PT are to get back on my feet more steady.    Currently in Pain? No/denies    Pain Onset In the past 7 days                [facility] PT Assessment - 07/21/21 0001       6 Minute Walk- Baseline   6 Minute Walk- Baseline no      6 Minute walk- Post Test   6 Minute Walk Post Test yes    HR (bpm) 102    02 Sat (%RA) 95 %    Modified Borg Scale for Dyspnea 1- Very mild  shortness of breath    Perceived Rate of Exertion (Borg) 13- Somewhat hard      6 minute walk test results    Aerobic Endurance Distance Walked 704                           [facility] Adult PT Treatment/Exercise - 07/21/21 0001       Transfers   Transfers Sit to Stand;Stand to Sit    Sit to Stand 5: Supervision;From chair/3-in-1;With upper extremity assist    Five time sit to stand comments  15.[place]  no UE support; 2nd trial   Stand to Sit 5: Supervision;To chair/3-in-1;With upper extremity assist    Comments Performed 5 reps sit<>stand, no UE support.      Ambulation/Gait   Ambulation/Gait Yes    Ambulation/Gait Assistance 5: Supervision    Ambulation Distance (Feet) 100 Feet   80 ft, 20 ft x 4 reps, additional gait in 6 MWT   Assistive device Rollator    Gait Pattern Step-through pattern;Decreased arm swing - right;Decreased arm swing - left;Decreased step length - right;Decreased step length - left;Poor foot clearance - left;Narrow base of support;Shuffle;Poor foot clearance - right    Ambulation Surface Level;Indoor    Stairs Yes    Stairs Assistance 5: Supervision    Stairs Assistance Details (indicate cue type and reason) Cues for foot clearance to "kick" foot forward to clear feet with descending steps    Stair Management Technique Two rails;Alternating pattern    Number of Stairs 4    Height of Stairs 6      Therapeutic Activites    Therapeutic Activities Other Therapeutic Activities    Other Therapeutic Activities Discussed progress towards goals and plans for PT to discharge at end of next week.  Pt reports NOT performing HEP at home and pt has not progressed with 5x sit<>stand score.  Reiterated importance of performing HEP at home; pt asks to continue therapy, but given pt's non-compliance with HEP and appearing to plateau with sit<>stand measure (she has improved with gait, but this has been a major focus in PT sessions), discussed that skilled PT will need  to plan for discharge at the end of this current POC.            Therapeutic Exercise   Access Code: [email or web address] URL: [email or web address] Date: 05/30/2021 Prepared by: Mady Haagensen   Exercises-Review of HEP.  Pt needs reminder cues (unable to pull up pictures from [place] today). Pt reports doing these exercises "none" when asked how many times a week she performs these.  Explained the importance of performing HEP consistently.  Sit to Stand - 1 x daily - 5 x weekly - 1 sets - 10 reps Seated March with Resistance - 1 x daily - 5 x weekly - 3 sets - 10 reps  (Performed 3 sets today)  Pt has previously reported not using resistance at home. Seated Long Arc Quad - 1 x daily - 5 x weekly - 3 sets - 10 reps - 3 sec hold (performed 3 sets today) Heel Toe Raises with Counter Support - 1 x daily - 5 x weekly - 2-3 sets - 10 reps Standing Hip Abduction - 1 x daily - 5 x weekly - 2-3 sets - 10 reps Narrow Stance with Counter Support - 1 x daily - 5 x weekly - 1 sets - 3 reps - 20 hold Standing Single Leg Stance with Counter Support - 1 x daily - 5 x weekly - 1 sets - 3 reps - 10 hold         PT Education - 07/21/21 0939     Education Details Reviewed HEP; discussed importance of HEP performance consistently, likely plans for d/c at end of next week.    Person(s) Educated Patient    Methods Explanation    Comprehension Verbalized understanding              PT Short Term Goals - 07/21/21 0907       PT SHORT TERM GOAL #1   Title Pt will  be independent with progression of HEP for strength, gait, and balance.  TARGET 07/25/2021    Baseline HEP additions ongoing 06/27/2021    Time 4    Period Weeks    Status Not Met      PT SHORT TERM GOAL #2   Title Pt will decrease 5 x sit to stand to less than 13 seconds for improved balance and functional strength.    Baseline 14.58 sec 06/27/21    Time 4    Period Weeks    Status Not Met      PT SHORT TERM GOAL #3   Title  Pt will improve 6MWT to at least 700 ft for improved gait efficiency for community gait.    Baseline 619 ft with 2 seated breaks; 07/21/21:  704 ft with 1 standing, 1 sitting break    Time 4    Period Weeks    Status Achieved               PT Long Term Goals - 06/27/21 1312       PT LONG TERM GOAL #1   Title Pt will be independent with final progression of HEP for improved balance, strength, gait to maximize gains made in PT.  08/08/21    Baseline 06/10/2021:  continueing to modify HEP, with walking program added today    Time 6    Period Weeks    Status Revised      PT LONG TERM GOAL #2   Title Pt will improve gait velocity to at least 2.62 ft/sec for improved gait efficiency and safety.    Baseline 2.07 ft/sec at eval (no device); 2.39 ft/sec 06/10/2021    Time 6    Period Weeks    Status On-going      PT LONG TERM GOAL #3   Title Pt will improve TUG score to less than or equal to 15 sec for decreased fall risk.    Baseline 19.19 sec; 20.31 sec 06/09/2021    Time 6    Period Weeks    Status On-going      PT LONG TERM GOAL #4   Title Pt will ambulate up/down 4 steps with bilateral rails supervision for improved safety with household access.    Baseline supervision/min guard, decreased foot clearance 06/27/21    Time 6    Period Weeks    Status On-going      PT LONG TERM GOAL #5   Title Pt will ambulate at least 1000 ft indoor and outdoor surfaces, using rollator, mod I, for improved community gait.    Baseline Pt ambulates 619 ft indoor surfaces, rollator, with 2 seated rest breaks due to fatigue 06/10/21    Time 6    Period Weeks    Status On-going                   Plan - 07/21/21 0941     Clinical Impression Statement Assessed STGs, with pt meeting 1 of 3 STGs.  She has not met STG 1, as she needs continued cues for HEP and she reports not performing HEP at home.  STG 2 not met, with 5x sit<>stand taking increased time today.  STG 3 is met for improved gait  distance with 6 MWT.  She only needs one sitting break (and one standing break) during 6MWT today.  Pt continues to fatigue in PT sessions, needing multiple sitting breaks and c/o muscle fatigue with exercises.  PT has had the conversation  today (and multiple times previous) that pt's performance of HEP at home will help lessen fatigue and help to improve strength, but pt reports performing HEP "none" at home.  Given pt's non-compliance with HEP and continued weakness, PT recommends working towards [place] and planning for d/c next week.    Personal Factors and Comorbidities Comorbidity 3+;Behavior Pattern    Comorbidities See above    Examination-Activity Limitations Locomotion Level;Transfers;Stairs;Stand    Examination-Participation Restrictions Occupation;Community Activity;Shop    Stability/Clinical Decision Making Evolving/Moderate complexity    Rehab Potential Good    PT Frequency 2x / week    PT Duration 6 weeks   per recert, including 05/26/349 visit   PT Treatment/Interventions DME Instruction;Neuromuscular re-education;Balance training;Therapeutic exercise;Therapeutic activities;Functional mobility training;Stair training;Gait training;Patient/family education;Orthotic Fit/Training;Passive range of motion;Manual techniques    PT Next Visit Plan Stair training, gait training, use of NuStep/SciFit for strenghtening. Discuss possibiliy of [facility] or community fitness to use machines (pt seemed interested in this when PT mentioned it).  Plan for d/c next week.    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   Lymphedema of left upper extremity 12/09/2020   Brain metastases ([place]) 11/01/2019   Thrombocytopenia ([place]) 08/09/2019   Port-A-Cath in place 06/20/2019   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   S/P mastectomy, left 12/09/2018   S/P breast reconstruction, left 12/09/2018   S/P radiation therapy 12/09/2018   GERD (gastroesophageal reflux disease) 12/10/2017   Edema 11/09/2017   Sensorineural hearing loss (SNHL) of both ears 11/05/2017   Vitamin D deficiency 01/18/2017   Bipolar I disorder, most recent episode depressed ([place]) 12/08/2016   Adjustment disorder with anxiety 12/06/2016   History of cancer metastatic to brain 07/27/2016   Iron deficiency anemia 06/26/2016   Bone metastases ([place]) 06/03/2016   Primary cancer of lower-inner quadrant of left female breast ([place]) 06/01/2016   Tobacco abuse 07/22/2015   Alcoholism in remission ([place]) 07/22/2015   Malignant neoplasm of female breast ([place]) 07/01/2015   Current smoker 03/28/2015   Goals of care, counseling/discussion 03/28/2015   Seasonal allergies 03/28/2015   Anxiety state 02/28/2015   Fibromyalgia 02/28/2015   Family history of diabetes mellitus 02/28/2015   H/O alcohol abuse     Lasya Vetter W. 07/21/2021, 9:46 AM Frazier Butt., PT   [facility] [address] [place], [place], [address] Phone: [phone]   Fax:  [phone]  Name: [name] MRN: [id number] Date of Birth: [birth year]

## 2021-07-22 ENCOUNTER — Telehealth (HOSPITAL_COMMUNITY): Payer: Self-pay | Admitting: Oncology

## 2021-07-22 ENCOUNTER — Ambulatory Visit (HOSPITAL_COMMUNITY): Admission: RE | Admit: 2021-07-22 | Payer: Medicaid Other | Source: Ambulatory Visit

## 2021-07-22 NOTE — Telephone Encounter (Signed)
Patient called and cancelled echocardiogram that you ordered and state she will call us back to reschedule. Order will be removed form the Active Echo wq and when patient calls back to reschedule we will reinstate the order. Thank you.

## 2021-07-24 ENCOUNTER — Ambulatory Visit: Payer: Medicaid Other | Admitting: Physical Therapy

## 2021-07-25 ENCOUNTER — Ambulatory Visit: Payer: Self-pay

## 2021-07-25 NOTE — Telephone Encounter (Signed)
Patient called and she says Dr. Margarita Rana told her to call back if the Amitiza didn't make her have a BM. She says she has not had a BM in 7 days since starting the medication. She says she takes Colace and Miralax daily along with the Amitiza. She says her abdomen is swollen and hard, no abdominal pain, she has gas. She says she has no urgency to go have a BM. I advised to try a glycerin suppository daily to soften up the stool from the bottom and maybe the softer stool will pass. I advised if she develops abdominal pain, increased swelling, pain in the rectum, any other symptoms to go to the ED. Advised I will send this to the office for review by Dr. Margarita Rana and someone will call back with her recommendation. Patient verbalized understanding of all advice given.   Summary: constipation   PCP prescribed lubiprostone (AMITIZA) 8 MCG capsule for constipation and advised her if it does not work PCP would prescribe another medication. Patient states miralax is not and constipation has not improved, patient states she has not pooped in 7 days. Seeking clinical advice      Reason for Disposition . Prescription request for new medicine (not a refill)  Answer Assessment - Initial Assessment Questions 1. NAME of MEDICATION: "What medicine are you calling about?"     Amitiza 2. QUESTION: "What is your question?" (e.g., double dose of medicine, side effect)     Not working for bowel movement, no BM in 7 days 3. PRESCRIBING HCP: "Who prescribed it?" Reason: if prescribed by specialist, call should be referred to that group.     Dr. Margarita Rana 4. SYMPTOMS: "Do you have any symptoms?"     No side effects, just no BM 5. SEVERITY: If symptoms are present, ask "Are they mild, moderate or severe?"     N/A 6. PREGNANCY:  "Is there any chance that you are pregnant?" "When was your last menstrual period?"     N/A  Protocols used: Medication Question Call-A-AH

## 2021-07-28 NOTE — Telephone Encounter (Signed)
Routing to PCP for review.

## 2021-07-29 ENCOUNTER — Ambulatory Visit: Payer: Medicaid Other | Admitting: Physical Therapy

## 2021-07-29 ENCOUNTER — Other Ambulatory Visit: Payer: Self-pay

## 2021-07-29 DIAGNOSIS — R2689 Other abnormalities of gait and mobility: Secondary | ICD-10-CM

## 2021-07-29 DIAGNOSIS — M6281 Muscle weakness (generalized): Secondary | ICD-10-CM

## 2021-07-29 DIAGNOSIS — R2681 Unsteadiness on feet: Secondary | ICD-10-CM

## 2021-07-29 NOTE — Therapy (Signed)
[facility] [address] [place], [place], [address] Phone: [phone]   Fax:  [phone]  Physical Therapy Treatment  Patient Details  Name: [name] MRN: [id number] Date of Birth: 1961/09/30 Referring Provider (PT): Magrinat, Sarajane Jews   Encounter Date: 07/29/2021   PT End of Session - 07/29/21 0933     Visit Number 17    Number of Visits 20    Authorization Type Medicaid    Authorization Time Period 12 visits from 06/24/21-08/04/21    Authorization - Visit Number 9    Authorization - Number of Visits 12    PT Start Time 0347    PT Stop Time 1012    PT Time Calculation (min) 38 min    Equipment Utilized During Treatment --    Activity Tolerance Patient tolerated treatment well    Behavior During Therapy [facility] for tasks assessed/performed;Impulsive             Past Medical History:  Diagnosis Date   Alcohol abuse    none since 2013   Anemia    during chemo   Anxiety    At age 27   Arthritis Dx 2010   Bipolar disorder ([place])    Breast cancer ([place])    Bronchitis    Cancer ([place])    breast mets to brain   CHF (congestive heart failure) (HCC)    Chronic pain    resolved per patient 03/24/94   Complication of anesthesia    Depression    Family history of adverse reaction to anesthesia    MOther had PONV   GERD (gastroesophageal reflux disease)    Headache    hx  migraines   HLD (hyperlipidemia)    Hypertension    Lymphedema of left arm    Opiate dependence (HCC)    PONV (postoperative nausea and vomiting)    Port-A-Cath in place    PTSD (post-traumatic stress disorder)    S/P endometrial ablation    in md's office    Past Surgical History:  Procedure Laterality Date   APPLICATION OF CRANIAL NAVIGATION N/A 08/14/2016   Procedure: APPLICATION OF CRANIAL NAVIGATION;  Surgeon: Erline Levine, MD;  Location: [facility];  Service: Neurosurgery;  Laterality: N/A;   BREAST RECONSTRUCTION Left    with  silicone implant   COLONOSCOPY W/ POLYPECTOMY     CRANIOTOMY N/A 08/14/2016   Procedure: CRANIOTOMY TUMOR EXCISION WITH Lucky Rathke;  Surgeon: Erline Levine, MD;  Location: [facility];  Service: Neurosurgery;  Laterality: N/A;   FIBULA FRACTURE SURGERY Left    MASTECTOMY Left    RADIOLOGY WITH ANESTHESIA N/A 07/23/2016   Procedure: MRI OF BRAIN WITH AND WITHOUT;  Surgeon: Medication Radiologist, MD;  Location: [facility];  Service: Radiology;  Laterality: N/A;   RADIOLOGY WITH ANESTHESIA N/A 09/08/2016   Procedure: MRI OF BRAIN WITH AND WITHOUT CONTRAST;  Surgeon: Medication Radiologist, MD;  Location: [facility];  Service: Radiology;  Laterality: N/A;   RADIOLOGY WITH ANESTHESIA N/A 12/10/2016   Procedure: MRI OF BRAIN WITH AND WITHOUT;  Surgeon: Medication Radiologist, MD;  Location: [facility];  Service: Radiology;  Laterality: N/A;   RADIOLOGY WITH ANESTHESIA N/A 03/02/2017   Procedure: MRI of BRAIN W and W/OUT CONTRAST;  Surgeon: Medication Radiologist, MD;  Location: [facility];  Service: Radiology;  Laterality: N/A;   RADIOLOGY WITH ANESTHESIA N/A 07/29/2017   Procedure: RADIOLOGY WITH ANESTHESIA MRI OF BRAIN WITH AND WITHOUT CONTRAST;  Surgeon: Radiologist, Medication, MD;  Location: [facility];  Service: Radiology;  Laterality: N/A;   RADIOLOGY WITH ANESTHESIA N/A 12/07/2017   Procedure: MRI WITH ANESTHESIA OF BRAIN WITH AND WITHOUT CONTRAST;  Surgeon: Radiologist, Medication, MD;  Location: [facility];  Service: Radiology;  Laterality: N/A;   RADIOLOGY WITH ANESTHESIA N/A 04/07/2018   Procedure: MRI OF BRAIN WITH AND WITHOUT CONTRAST;  Surgeon: Radiologist, Medication, MD;  Location: [place];  Service: Radiology;  Laterality: N/A;   RADIOLOGY WITH ANESTHESIA N/A 08/23/2018   Procedure: MRI WITH ANESTHESIA OF THE BRAIN WITH AND WITHOUT;  Surgeon: Radiologist, Medication, MD;  Location: [facility];  Service: Radiology;  Laterality: N/A;   RADIOLOGY WITH ANESTHESIA N/A 01/24/2019   Procedure: MRI OF BRAIN WITH AND WITHOUT CONTRAST;   Surgeon: Radiologist, Medication, MD;  Location: [facility];  Service: Radiology;  Laterality: N/A;   RADIOLOGY WITH ANESTHESIA N/A 07/06/2019   Procedure: MRI WITH ANESTHESIA OF BRAIN WITH AND WITHOUT CONTRAST;  Surgeon: Radiologist, Medication, MD;  Location: [facility];  Service: Radiology;  Laterality: N/A;   RADIOLOGY WITH ANESTHESIA N/A 01/11/2020   Procedure: MRI WITH ANESTHESIA BRAIN WITH AND WITHOUT;  Surgeon: Radiologist, Medication, MD;  Location: [facility];  Service: Radiology;  Laterality: N/A;   RADIOLOGY WITH ANESTHESIA N/A 05/21/2020   Procedure: MRI WITH ANESTHESIA OF BRAIN WITH AND WITHOUT CONTRAST;  Surgeon: Radiologist, Medication, MD;  Location: [facility];  Service: Radiology;  Laterality: N/A;   RADIOLOGY WITH ANESTHESIA N/A 01/02/2021   Procedure: MRI WITH ANESTHESIA  BRAIN WITH AND WITHOUT CONTRAST,TOTAL SPINE MET SCREENING;  Surgeon: Radiologist, Medication, MD;  Location: [facility];  Service: Radiology;  Laterality: N/A;   right power port placement Right     There were no vitals filed for this visit.   Subjective Assessment - 07/29/21 0933     Subjective Been constipated, and that has been very painful.  Mostly just sit during the day (even on the work site)    Pertinent History PMH: breast cancer with mets to brain/lymph node, alcohol abuse (none since 2013), anemia (during chemo), anxiety, arthritis, bipoloar, bronchitis, headaches (hx of migraines), lymphedema of left arm, PTSD    Limitations Sitting;Standing;Walking    Patient Stated Goals Pt's goals for PT are to get back on my feet more steady.    Currently in Pain? No/denies    Pain Onset In the past 7 days                               [facility] Adult PT Treatment/Exercise - 07/29/21 0001       Ambulation/Gait   Ambulation/Gait Yes    Ambulation/Gait Assistance 5: Supervision    Ambulation Distance (Feet) 300 Feet   185   Assistive device Rollator    Gait Pattern Step-through pattern;Decreased arm swing -  right;Decreased arm swing - left;Decreased step length - right;Decreased step length - left;Poor foot clearance - left;Narrow base of support;Shuffle;Poor foot clearance - right    Ambulation Surface Level;Indoor    Stairs Yes    Stairs Assistance 5: Supervision    Stairs Assistance Details (indicate cue type and reason) Cues for foot clearance and foot placement; cues to use step-to pattern descending for optimal safety.  Pt needs cues throughout to remember to continue step-to pattern.    Stair Management Technique Two rails;Alternating pattern    Number of Stairs 4   x 5   Height of Stairs 6      Self-Care   Self-Care  Other Self-Care Comments    Other Self-Care Comments  Discussed [facility], using NuStep and possibly recumbent bike. Discussed transition to [facility] 2x/wk for fitness in place of therapy sessions (planning for d/c this week).  Requested pt call [facility] and try to find transportation prior to last PT session next visit.      Knee/Hip Exercises: Aerobic   Nustep Level 2 with UE/LE"s for 5 minutes work, 2 minute rest, then 4 additional minutes of work with goal >/= 40 steps per minute for strengthening and activity tolerance.            Advised pt that she could likely start NuStep at [facility] 2 bouts of 5 minutes with short rest in between.        PT Education - 07/29/21 1003     Education Details [facility] information    Person(s) Educated Patient    Methods Explanation;Handout    Comprehension Verbalized understanding              PT Short Term Goals - 07/21/21 0907       PT SHORT TERM GOAL #1   Title Pt will be independent with progression of HEP for strength, gait, and balance.  TARGET 07/25/2021    Baseline HEP additions ongoing 06/27/2021    Time 4    Period Weeks    Status Not Met      PT SHORT TERM GOAL #2   Title Pt will decrease 5 x sit to stand to less than 13 seconds  for improved balance and functional strength.    Baseline 14.58 sec 06/27/21    Time 4    Period Weeks    Status Not Met      PT SHORT TERM GOAL #3   Title Pt will improve 6MWT to at least 700 ft for improved gait efficiency for community gait.    Baseline 619 ft with 2 seated breaks; 07/21/21:  704 ft with 1 standing, 1 sitting break    Time 4    Period Weeks    Status Achieved               PT Long Term Goals - 06/27/21 1312       PT LONG TERM GOAL #1   Title Pt will be independent with final progression of HEP for improved balance, strength, gait to maximize gains made in PT.  08/08/21    Baseline 06/10/2021:  continueing to modify HEP, with walking program added today    Time 6    Period Weeks    Status Revised      PT LONG TERM GOAL #2   Title Pt will improve gait velocity to at least 2.62 ft/sec for improved gait efficiency and safety.    Baseline 2.07 ft/sec at eval (no device); 2.39 ft/sec 06/10/2021    Time 6    Period Weeks    Status On-going      PT LONG TERM GOAL #3   Title Pt will improve TUG score to less than or equal to 15 sec for decreased fall risk.    Baseline 19.19 sec; 20.31 sec 06/09/2021    Time 6    Period Weeks    Status On-going      PT LONG TERM GOAL #4   Title Pt will ambulate up/down 4 steps with bilateral rails supervision for improved safety with household access.    Baseline supervision/min guard,  decreased foot clearance 06/27/21    Time 6    Period Weeks    Status On-going      PT LONG TERM GOAL #5   Title Pt will ambulate at least 1000 ft indoor and outdoor surfaces, using rollator, mod I, for improved community gait.    Baseline Pt ambulates 619 ft indoor surfaces, rollator, with 2 seated rest breaks due to fatigue 06/10/21    Time 6    Period Weeks    Status On-going                   Plan - 07/29/21 1007     Clinical Impression Statement Stair negoitation, gait activities performed today.  Frequent cues with stair  training for step-to pattern descending steps, for optimal safety, which is safer than step through pattern (pt tends to try this way).  Needs more practice with stairs.  Provided instructions for [facility] room, to try to transition after d/c from PT this week.  Plan for checking LTGs and discharge next visit.    Personal Factors and Comorbidities Comorbidity 3+;Behavior Pattern    Comorbidities See above    Examination-Activity Limitations Locomotion Level;Transfers;Stairs;Stand    Examination-Participation Restrictions Occupation;Community Activity;Shop    Stability/Clinical Decision Making Evolving/Moderate complexity    Rehab Potential Good    PT Frequency 2x / week    PT Duration 6 weeks   per recert, including 4/96/7591 visit   PT Treatment/Interventions DME Instruction;Neuromuscular re-education;Balance training;Therapeutic exercise;Therapeutic activities;Functional mobility training;Stair training;Gait training;Patient/family education;Orthotic Fit/Training;Passive range of motion;Manual techniques    PT Next Visit Plan Check LTGs, plan for d/c next visit.  [facility] stairs (step to pattern for safety) and ask about [facility].    PT Home Exercise Plan Access Code: [email or web address]    Consulted and Agree with Plan of Care Patient             Patient will benefit from skilled therapeutic intervention in order to improve the following deficits and impairments:  Abnormal gait, Difficulty walking, Decreased balance, Decreased mobility, Decreased strength  Visit Diagnosis: Other abnormalities of gait and mobility  Muscle weakness (generalized)  Unsteadiness on feet     Problem List Patient Active Problem List   Diagnosis Date Noted   Physical deconditioning 04/14/2021   Balance disorder 04/14/2021   Shoulder disorder 04/14/2021   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   Lymphedema of left upper extremity 12/09/2020   Brain metastases ([place]) 11/01/2019    Thrombocytopenia ([place]) 08/09/2019   Port-A-Cath in place 06/20/2019   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   S/P mastectomy, left 12/09/2018   S/P breast reconstruction, left 12/09/2018   S/P radiation therapy 12/09/2018   GERD (gastroesophageal reflux disease) 12/10/2017   Edema 11/09/2017   Sensorineural hearing loss (SNHL) of both ears 11/05/2017   Vitamin D deficiency 01/18/2017   Bipolar I disorder, most recent episode depressed ([place]) 12/08/2016   Adjustment disorder with anxiety 12/06/2016   History of cancer metastatic to brain 07/27/2016   Iron deficiency anemia 06/26/2016   Bone metastases ([place]) 06/03/2016   Primary cancer of lower-inner quadrant of left female breast ([place]) 06/01/2016   Tobacco abuse 07/22/2015   Alcoholism in remission ([place]) 07/22/2015   Malignant neoplasm of female breast ([place]) 07/01/2015   Current smoker 03/28/2015   Goals of care, counseling/discussion 03/28/2015   Seasonal allergies 03/28/2015   Anxiety state 02/28/2015   Fibromyalgia 02/28/2015   Family history of diabetes  mellitus 02/28/2015   H/O alcohol abuse     Jamarl Pew W. 07/29/2021, 10:13 AM Frazier Butt., PT   [facility] [address] [place], [place], [address] Phone: [phone]   Fax:  [phone]  Name: [name] MRN: [id number] Date of Birth: 01-22-1961

## 2021-07-30 ENCOUNTER — Other Ambulatory Visit: Payer: Self-pay | Admitting: *Deleted

## 2021-07-31 ENCOUNTER — Other Ambulatory Visit: Payer: Medicaid Other

## 2021-07-31 ENCOUNTER — Ambulatory Visit: Payer: Medicaid Other

## 2021-07-31 ENCOUNTER — Other Ambulatory Visit: Payer: Self-pay | Admitting: Oncology

## 2021-07-31 ENCOUNTER — Telehealth: Payer: Self-pay | Admitting: Oncology

## 2021-07-31 ENCOUNTER — Ambulatory Visit: Payer: Medicaid Other | Admitting: Physical Therapy

## 2021-07-31 ENCOUNTER — Ambulatory Visit: Payer: Medicaid Other | Admitting: Physician Assistant

## 2021-07-31 NOTE — Telephone Encounter (Signed)
Per sch msg, patient wanted to reschedule 9/8 appt, moved to following week and adjusted other appts. Called and spoke with patient. Confirmed appts

## 2021-08-04 ENCOUNTER — Other Ambulatory Visit: Payer: Self-pay | Admitting: Oncology

## 2021-08-06 ENCOUNTER — Ambulatory Visit: Payer: Medicaid Other | Admitting: Physical Therapy

## 2021-08-07 ENCOUNTER — Ambulatory Visit: Payer: Medicaid Other

## 2021-08-07 ENCOUNTER — Other Ambulatory Visit: Payer: Medicaid Other

## 2021-08-11 ENCOUNTER — Ambulatory Visit: Payer: Self-pay | Admitting: *Deleted

## 2021-08-11 ENCOUNTER — Ambulatory Visit: Payer: Medicaid Other | Admitting: Family Medicine

## 2021-08-11 NOTE — Telephone Encounter (Signed)
Answer Assessment - Initial Assessment Questions 1. STOOL PATTERN OR FREQUENCY: "How often do you have a bowel movement (BM)?"  (Normal range: 3 times a day to every 3 days)  "When was your last BM?"       For Years, worsening last 6 months 2. STRAINING: "Do you have to strain to have a BM?"      yes 3. RECTAL PAIN: "Does your rectum hurt when the stool comes out?" If Yes, ask: "Do you have hemorrhoids? How bad is the pain?"  (Scale 1-10; or mild, moderate, severe)     no 4. STOOL COMPOSITION: "Are the stools hard?"      Loose  like mushy, worms 5. BLOOD ON STOOLS: "Has there been any blood on the toilet tissue or on the surface of the BM?" If Yes, ask: "When was the last time?"      no 6. CHRONIC CONSTIPATION: "Is this a new problem for you?"  If no, ask: "How long have you had this problem?" (days, weeks, months)      years 7. CHANGES IN DIET OR HYDRATION: "Have there been any recent changes in your diet?" "How much fluids are you drinking on a daily basis?"  "How much have you had to drink today?"     no 8. MEDICATIONS: "Have you been taking any new medications?" "Are you taking any narcotic pain medications?" (e.g., Vicodin, Percocet, morphine, Dilaudid)      9. LAXATIVES: "Have you been using any stool softeners, laxatives, or enemas?"  If yes, ask "What, how often, and when was the last time?"     Miralax 2 days ago 10. ACTIVITY:  "How much walking do you do every day?"  "Has your activity level decreased in the past week?"        *No Answer* 11. CAUSE: "What do you think is causing the constipation?"        unsure 12. OTHER SYMPTOMS: "Do you have any other symptoms?" (e.g., abdominal pain, bloating, fever, vomiting)       Bloating, cramps at times 13. MEDICAL HISTORY: "Do you have a history of hemorrhoids, rectal fissures, or rectal surgery or rectal abscess?"  Protocols used: Constipation-A-AH

## 2021-08-11 NOTE — Telephone Encounter (Signed)
Pt reports had appt today for this issue and "Forgot."  Reports chronic constipation for years, worsening past 6 months. Reports cramping prior to BM and bloating. Reports stools are "Mushy. I feel like I'm not emptying." Denies any visible blood in stools, no rectal pain. Has been taking Amitiza as prescribed, occasional miralax. Assured pt NT would route to practice for PCPs review and final disposition. Advied ED for abdominal pain, fever, worsening symptoms. Pt verbalizes understanding. CB# [phone]    Reason for Disposition  Constipation is a chronic symptom (recurrent or ongoing AND present > 4 weeks)  Protocols used: Constipation-A-AH

## 2021-08-12 ENCOUNTER — Ambulatory Visit (HOSPITAL_COMMUNITY)
Admission: RE | Admit: 2021-08-12 | Discharge: 2021-08-12 | Disposition: A | Discharge status: Home / Self Care | Payer: Medicaid Other | Source: Ambulatory Visit | Attending: Oncology | Admitting: Oncology

## 2021-08-12 ENCOUNTER — Other Ambulatory Visit: Payer: Self-pay

## 2021-08-12 DIAGNOSIS — Z0189 Encounter for other specified special examinations: Secondary | ICD-10-CM | POA: Diagnosis not present

## 2021-08-12 DIAGNOSIS — C770 Secondary and unspecified malignant neoplasm of lymph nodes of head, face and neck: Secondary | ICD-10-CM | POA: Diagnosis not present

## 2021-08-12 DIAGNOSIS — Z01818 Encounter for other preprocedural examination: Secondary | ICD-10-CM | POA: Insufficient documentation

## 2021-08-12 DIAGNOSIS — I351 Nonrheumatic aortic (valve) insufficiency: Secondary | ICD-10-CM | POA: Insufficient documentation

## 2021-08-12 DIAGNOSIS — C50312 Malignant neoplasm of lower-inner quadrant of left female breast: Secondary | ICD-10-CM | POA: Diagnosis not present

## 2021-08-12 LAB — ECHOCARDIOGRAM COMPLETE
Area-P 1/2: 4.31 cm2
S' Lateral: 2.9 cm
Single Plane A4C EF: 61.6 %

## 2021-08-12 NOTE — Telephone Encounter (Signed)
Left message on voicemail to return call.  Attempt to schedule for same day appt with [facility]. Left phone number on voicemail.

## 2021-08-15 ENCOUNTER — Inpatient Hospital Stay: Payer: Medicaid Other

## 2021-08-18 ENCOUNTER — Telehealth: Payer: Self-pay | Admitting: Oncology

## 2021-08-18 NOTE — Telephone Encounter (Signed)
Sch per 9/16in basket, pt aware

## 2021-08-19 ENCOUNTER — Ambulatory Visit (HOSPITAL_COMMUNITY)
Admission: RE | Admit: 2021-08-19 | Discharge: 2021-08-19 | Disposition: A | Discharge status: Home / Self Care | Payer: Medicaid Other | Source: Ambulatory Visit | Attending: Oncology | Admitting: Oncology

## 2021-08-19 ENCOUNTER — Other Ambulatory Visit: Payer: Self-pay

## 2021-08-19 DIAGNOSIS — C770 Secondary and unspecified malignant neoplasm of lymph nodes of head, face and neck: Secondary | ICD-10-CM | POA: Insufficient documentation

## 2021-08-19 DIAGNOSIS — C50312 Malignant neoplasm of lower-inner quadrant of left female breast: Secondary | ICD-10-CM | POA: Diagnosis present

## 2021-08-19 LAB — GLUCOSE, CAPILLARY: Glucose-Capillary: 98 mg/dL (ref 70–99)

## 2021-08-19 MED ORDER — FLUDEOXYGLUCOSE F - 18 (FDG) INJECTION
7.8000 | Freq: Once | INTRAVENOUS | Status: AC
  Administered 2021-08-19: 7.53 via INTRAVENOUS

## 2021-08-20 ENCOUNTER — Encounter: Payer: Self-pay | Admitting: Oncology

## 2021-08-21 ENCOUNTER — Inpatient Hospital Stay: Payer: Medicaid Other | Attending: Medical

## 2021-08-21 ENCOUNTER — Ambulatory Visit: Payer: Medicaid Other

## 2021-08-21 ENCOUNTER — Inpatient Hospital Stay: Payer: Medicaid Other

## 2021-08-21 ENCOUNTER — Ambulatory Visit: Payer: Medicaid Other | Admitting: Oncology

## 2021-08-21 ENCOUNTER — Other Ambulatory Visit: Payer: Medicaid Other

## 2021-08-21 ENCOUNTER — Other Ambulatory Visit: Payer: Self-pay

## 2021-08-21 VITALS — BP 110/75 | HR 91 | Resp 18 | Wt 151.2 lb

## 2021-08-21 DIAGNOSIS — Z95828 Presence of other vascular implants and grafts: Secondary | ICD-10-CM

## 2021-08-21 DIAGNOSIS — C770 Secondary and unspecified malignant neoplasm of lymph nodes of head, face and neck: Secondary | ICD-10-CM

## 2021-08-21 DIAGNOSIS — D696 Thrombocytopenia, unspecified: Secondary | ICD-10-CM

## 2021-08-21 DIAGNOSIS — Z79899 Other long term (current) drug therapy: Secondary | ICD-10-CM | POA: Insufficient documentation

## 2021-08-21 DIAGNOSIS — C50312 Malignant neoplasm of lower-inner quadrant of left female breast: Secondary | ICD-10-CM | POA: Insufficient documentation

## 2021-08-21 DIAGNOSIS — C7951 Secondary malignant neoplasm of bone: Secondary | ICD-10-CM | POA: Insufficient documentation

## 2021-08-21 DIAGNOSIS — C50119 Malignant neoplasm of central portion of unspecified female breast: Secondary | ICD-10-CM

## 2021-08-21 DIAGNOSIS — Z5112 Encounter for antineoplastic immunotherapy: Secondary | ICD-10-CM | POA: Insufficient documentation

## 2021-08-21 DIAGNOSIS — C7931 Secondary malignant neoplasm of brain: Secondary | ICD-10-CM | POA: Insufficient documentation

## 2021-08-21 LAB — CMP (CANCER CENTER ONLY)
ALT: 18 U/L (ref 0–44)
AST: 23 U/L (ref 15–41)
Albumin: 4.1 g/dL (ref 3.5–5.0)
Alkaline Phosphatase: 81 U/L (ref 38–126)
Anion gap: 11 (ref 5–15)
BUN: 13 mg/dL (ref 6–20)
CO2: 29 mmol/L (ref 22–32)
Calcium: 10.5 mg/dL — ABNORMAL HIGH (ref 8.9–10.3)
Chloride: 100 mmol/L (ref 98–111)
Creatinine: 1.17 mg/dL — ABNORMAL HIGH (ref 0.44–1.00)
GFR, Estimated: 53 mL/min — ABNORMAL LOW (ref >60)
Glucose, Bld: 106 mg/dL — ABNORMAL HIGH (ref 70–99)
Potassium: 4 mmol/L (ref 3.5–5.1)
Sodium: 140 mmol/L (ref 135–145)
Total Bilirubin: 0.4 mg/dL (ref 0.3–1.2)
Total Protein: 7.2 g/dL (ref 6.5–8.1)

## 2021-08-21 LAB — CBC WITH DIFFERENTIAL (CANCER CENTER ONLY)
Abs Immature Granulocytes: 0.01 10*3/uL (ref 0.00–0.07)
Basophils Absolute: 0 10*3/uL (ref 0.0–0.1)
Basophils Relative: 0 %
Eosinophils Absolute: 0 10*3/uL (ref 0.0–0.5)
Eosinophils Relative: 0 %
HCT: 35.6 % — ABNORMAL LOW (ref 36.0–46.0)
Hemoglobin: 11.8 g/dL — ABNORMAL LOW (ref 12.0–15.0)
Immature Granulocytes: 0 %
Lymphocytes Relative: 26 %
Lymphs Abs: 1 10*3/uL (ref 0.7–4.0)
MCH: 29.7 pg (ref 26.0–34.0)
MCHC: 33.1 g/dL (ref 30.0–36.0)
MCV: 89.7 fL (ref 80.0–100.0)
Monocytes Absolute: 0.4 10*3/uL (ref 0.1–1.0)
Monocytes Relative: 9 %
Neutro Abs: 2.6 10*3/uL (ref 1.7–7.7)
Neutrophils Relative %: 65 %
Platelet Count: 131 10*3/uL — ABNORMAL LOW (ref 150–400)
RBC: 3.97 MIL/uL (ref 3.87–5.11)
RDW: 13.3 % (ref 11.5–15.5)
WBC Count: 4 10*3/uL (ref 4.0–10.5)
nRBC: 0 % (ref 0.0–0.2)

## 2021-08-21 MED ORDER — ACETAMINOPHEN 325 MG PO TABS
ORAL_TABLET | ORAL | Status: AC
  Filled 2021-08-21: qty 2

## 2021-08-21 MED ORDER — TRASTUZUMAB-DKST CHEMO 150 MG IV SOLR
6.0000 mg/kg | Freq: Once | INTRAVENOUS | Status: AC
  Administered 2021-08-21: 399 mg via INTRAVENOUS
  Filled 2021-08-21: qty 19

## 2021-08-21 MED ORDER — ACETAMINOPHEN 325 MG PO TABS
650.0000 mg | ORAL_TABLET | Freq: Once | ORAL | Status: AC
Start: 2021-08-21 — End: 2021-08-21
  Administered 2021-08-21: 650 mg via ORAL

## 2021-08-21 MED ORDER — SODIUM CHLORIDE 0.9% FLUSH
10.0000 mL | INTRAVENOUS | Status: DC | PRN
  Administered 2021-08-21: 10 mL via INTRAVENOUS

## 2021-08-21 MED ORDER — HEPARIN SOD (PORK) LOCK FLUSH 100 UNIT/ML IV SOLN
500.0000 [IU] | Freq: Once | INTRAVENOUS | Status: AC | PRN
  Administered 2021-08-21: 500 [IU]

## 2021-08-21 MED ORDER — SODIUM CHLORIDE 0.9% FLUSH
10.0000 mL | INTRAVENOUS | Status: DC | PRN
  Administered 2021-08-21: 10 mL

## 2021-08-21 MED ORDER — SODIUM CHLORIDE 0.9 % IV SOLN: Freq: Once | INTRAVENOUS | Status: AC

## 2021-08-21 NOTE — Patient Instructions (Signed)
[facility]COL[facility]  Discharge Instructions: Thank you for choosing [facility] to provide your oncology and hematology care.   If you have a lab appointment with the Cancer Center, please go directly to the Cancer Center and check in at the registration area.   Wear comfortable clothing and clothing appropriate for easy access to any Portacath or PICC line.   We strive to give you quality time with your provider. You may need to reschedule your appointment if you arrive late (15 or more minutes).  Arriving late affects you and other patients whose appointments are after yours.  Also, if you miss three or more appointments without notifying the office, you may be dismissed from the clinic at the provider's discretion.      For prescription refill requests, have your pharmacy contact our office and allow 72 hours for refills to be completed.    Today you received the following chemotherapy and/or immunotherapy agents trastuzumab      To help prevent nausea and vomiting after your treatment, we encourage you to take your nausea medication as directed.  BELOW ARE SYMPTOMS THAT SHOULD BE REPORTED IMMEDIATELY: *FEVER GREATER THAN 100.4 F (38 C) OR HIGHER *CHILLS OR SWEATING *NAUSEA AND VOMITING THAT IS NOT CONTROLLED WITH YOUR NAUSEA MEDICATION *UNUSUAL SHORTNESS OF BREATH *UNUSUAL BRUISING OR BLEEDING *URINARY PROBLEMS (pain or burning when urinating, or frequent urination) *BOWEL PROBLEMS (unusual diarrhea, constipation, pain near the anus) TENDERNESS IN MOUTH AND THROAT WITH OR WITHOUT PRESENCE OF ULCERS (sore throat, sores in mouth, or a toothache) UNUSUAL RASH, SWELLING OR PAIN  UNUSUAL VAGINAL DISCHARGE OR ITCHING   Items with * indicate a potential emergency and should be followed up as soon as possible or go to the Emergency Department if any problems should occur.  Please show the CHEMOTHERAPY ALERT CARD or IMMUNOTHERAPY ALERT CARD at check-in to  the Emergency Department and triage nurse.  Should you have questions after your visit or need to cancel or reschedule your appointment, please contact [facility]  Dept: [phone]  and follow the prompts.  Office hours are 8:00 a.m. to 4:30 p.m. Monday - Friday. Please note that voicemails left after 4:00 p.m. may not be returned until the following business day.  We are closed weekends and major holidays. You have access to a nurse at all times for urgent questions. Please call the main number to the clinic Dept: [phone] and follow the prompts.   For any non-urgent questions, you may also contact your provider using MyChart. We now offer e-Visits for anyone 18 and older to request care online for non-urgent symptoms. For details visit [email or web address].   Also download the MyChart app! Go to the app store, search "MyChart", open the app, select [facility], and log in with your MyChart username and password.  Due to Covid, a mask is required upon entering the hospital/clinic. If you do not have a mask, one will be given to you upon arrival. For doctor visits, patients may have 1 support person aged 18 or older with them. For treatment visits, patients cannot have anyone with them due to current Covid guidelines and our immunocompromised population.   

## 2021-08-26 ENCOUNTER — Inpatient Hospital Stay: Payer: Medicaid Other | Admitting: Adult Health

## 2021-08-26 ENCOUNTER — Other Ambulatory Visit: Payer: Self-pay

## 2021-08-28 ENCOUNTER — Ambulatory Visit: Payer: Medicaid Other

## 2021-08-28 ENCOUNTER — Other Ambulatory Visit: Payer: Self-pay | Admitting: Oncology

## 2021-08-28 ENCOUNTER — Ambulatory Visit: Payer: Medicaid Other | Admitting: Oncology

## 2021-08-28 ENCOUNTER — Other Ambulatory Visit: Payer: Medicaid Other

## 2021-08-29 ENCOUNTER — Telehealth: Payer: Self-pay | Admitting: Oncology

## 2021-08-29 NOTE — Telephone Encounter (Signed)
Sch per 9/29 inbasket,left msg

## 2021-09-04 ENCOUNTER — Ambulatory Visit: Payer: Medicaid Other | Admitting: Oncology

## 2021-09-04 ENCOUNTER — Ambulatory Visit: Payer: Medicaid Other

## 2021-09-04 ENCOUNTER — Other Ambulatory Visit: Payer: Medicaid Other

## 2021-09-04 ENCOUNTER — Ambulatory Visit: Payer: Medicaid Other | Admitting: Adult Health

## 2021-09-08 ENCOUNTER — Ambulatory Visit: Payer: Medicaid Other

## 2021-09-11 ENCOUNTER — Inpatient Hospital Stay: Payer: Medicaid Other

## 2021-09-11 ENCOUNTER — Inpatient Hospital Stay: Payer: Medicaid Other | Attending: Medical

## 2021-09-11 ENCOUNTER — Inpatient Hospital Stay (HOSPITAL_BASED_OUTPATIENT_CLINIC_OR_DEPARTMENT_OTHER): Payer: Medicaid Other | Admitting: Oncology

## 2021-09-11 ENCOUNTER — Other Ambulatory Visit: Payer: Self-pay

## 2021-09-11 VITALS — HR 98

## 2021-09-11 VITALS — BP 131/75 | HR 108 | Temp 97.5°F | Resp 18 | Ht 65.0 in | Wt 155.8 lb

## 2021-09-11 DIAGNOSIS — C50312 Malignant neoplasm of lower-inner quadrant of left female breast: Secondary | ICD-10-CM | POA: Diagnosis not present

## 2021-09-11 DIAGNOSIS — Z79899 Other long term (current) drug therapy: Secondary | ICD-10-CM | POA: Insufficient documentation

## 2021-09-11 DIAGNOSIS — C7951 Secondary malignant neoplasm of bone: Secondary | ICD-10-CM | POA: Diagnosis present

## 2021-09-11 DIAGNOSIS — C770 Secondary and unspecified malignant neoplasm of lymph nodes of head, face and neck: Secondary | ICD-10-CM

## 2021-09-11 DIAGNOSIS — D696 Thrombocytopenia, unspecified: Secondary | ICD-10-CM

## 2021-09-11 DIAGNOSIS — C50119 Malignant neoplasm of central portion of unspecified female breast: Secondary | ICD-10-CM

## 2021-09-11 DIAGNOSIS — Z95828 Presence of other vascular implants and grafts: Secondary | ICD-10-CM

## 2021-09-11 DIAGNOSIS — C7931 Secondary malignant neoplasm of brain: Secondary | ICD-10-CM

## 2021-09-11 DIAGNOSIS — Z5112 Encounter for antineoplastic immunotherapy: Secondary | ICD-10-CM | POA: Insufficient documentation

## 2021-09-11 LAB — CBC WITH DIFFERENTIAL (CANCER CENTER ONLY)
Abs Immature Granulocytes: 0.02 10*3/uL (ref 0.00–0.07)
Basophils Absolute: 0 10*3/uL (ref 0.0–0.1)
Basophils Relative: 0 %
Eosinophils Absolute: 0 10*3/uL (ref 0.0–0.5)
Eosinophils Relative: 0 %
HCT: 34.6 % — ABNORMAL LOW (ref 36.0–46.0)
Hemoglobin: 11.3 g/dL — ABNORMAL LOW (ref 12.0–15.0)
Immature Granulocytes: 0 %
Lymphocytes Relative: 18 %
Lymphs Abs: 0.9 10*3/uL (ref 0.7–4.0)
MCH: 29.6 pg (ref 26.0–34.0)
MCHC: 32.7 g/dL (ref 30.0–36.0)
MCV: 90.6 fL (ref 80.0–100.0)
Monocytes Absolute: 0.4 10*3/uL (ref 0.1–1.0)
Monocytes Relative: 8 %
Neutro Abs: 3.6 10*3/uL (ref 1.7–7.7)
Neutrophils Relative %: 74 %
Platelet Count: 135 10*3/uL — ABNORMAL LOW (ref 150–400)
RBC: 3.82 MIL/uL — ABNORMAL LOW (ref 3.87–5.11)
RDW: 13.4 % (ref 11.5–15.5)
WBC Count: 4.9 10*3/uL (ref 4.0–10.5)
nRBC: 0 % (ref 0.0–0.2)

## 2021-09-11 LAB — CMP (CANCER CENTER ONLY)
ALT: 18 U/L (ref 0–44)
AST: 24 U/L (ref 15–41)
Albumin: 4.4 g/dL (ref 3.5–5.0)
Alkaline Phosphatase: 75 U/L (ref 38–126)
Anion gap: 7 (ref 5–15)
BUN: 16 mg/dL (ref 6–20)
CO2: 30 mmol/L (ref 22–32)
Calcium: 9.3 mg/dL (ref 8.9–10.3)
Chloride: 100 mmol/L (ref 98–111)
Creatinine: 1.05 mg/dL — ABNORMAL HIGH (ref 0.44–1.00)
GFR, Estimated: >60 mL/min (ref >60)
Glucose, Bld: 103 mg/dL — ABNORMAL HIGH (ref 70–99)
Potassium: 3.5 mmol/L (ref 3.5–5.1)
Sodium: 137 mmol/L (ref 135–145)
Total Bilirubin: 0.4 mg/dL (ref 0.3–1.2)
Total Protein: 7.4 g/dL (ref 6.5–8.1)

## 2021-09-11 MED ORDER — IBUPROFEN 800 MG PO TABS: 800.0000 mg | ORAL_TABLET | Freq: Three times a day (TID) | ORAL | 0 refills | Status: DC | PRN

## 2021-09-11 MED ORDER — HEPARIN SOD (PORK) LOCK FLUSH 100 UNIT/ML IV SOLN
500.0000 [IU] | Freq: Once | INTRAVENOUS | Status: AC | PRN
  Administered 2021-09-11: 500 [IU]

## 2021-09-11 MED ORDER — SODIUM CHLORIDE 0.9 % IV SOLN: Freq: Once | INTRAVENOUS | Status: AC

## 2021-09-11 MED ORDER — ACETAMINOPHEN 500 MG PO TABS: 500.0000 mg | ORAL_TABLET | Freq: Two times a day (BID) | ORAL | 4 refills | Status: AC | PRN

## 2021-09-11 MED ORDER — TRASTUZUMAB-DKST CHEMO 150 MG IV SOLR
6.0000 mg/kg | Freq: Once | INTRAVENOUS | Status: AC
  Administered 2021-09-11: 399 mg via INTRAVENOUS
  Filled 2021-09-11: qty 19

## 2021-09-11 MED ORDER — ACETAMINOPHEN 325 MG PO TABS
650.0000 mg | ORAL_TABLET | Freq: Once | ORAL | Status: AC
  Administered 2021-09-11: 650 mg via ORAL
  Filled 2021-09-11: qty 2

## 2021-09-11 MED ORDER — ANASTROZOLE 1 MG PO TABS: 1.0000 mg | ORAL_TABLET | Freq: Every day | ORAL | 4 refills | Status: AC

## 2021-09-11 MED ORDER — NAPROXEN 250 MG PO TABS: 250.0000 mg | ORAL_TABLET | Freq: Two times a day (BID) | ORAL | 4 refills | Status: AC | PRN

## 2021-09-11 MED ORDER — SODIUM CHLORIDE 0.9% FLUSH
10.0000 mL | INTRAVENOUS | Status: AC | PRN
  Administered 2021-09-11: 10 mL

## 2021-09-11 MED ORDER — SODIUM CHLORIDE 0.9% FLUSH
10.0000 mL | INTRAVENOUS | Status: DC | PRN
  Administered 2021-09-11: 10 mL

## 2021-09-11 NOTE — Patient Instructions (Signed)
[facility]COL[facility]  Discharge Instructions: Thank you for choosing [facility] to provide your oncology and hematology care.   If you have a lab appointment with the Cancer Center, please go directly to the Cancer Center and check in at the registration area.   Wear comfortable clothing and clothing appropriate for easy access to any Portacath or PICC line.   We strive to give you quality time with your provider. You may need to reschedule your appointment if you arrive late (15 or more minutes).  Arriving late affects you and other patients whose appointments are after yours.  Also, if you miss three or more appointments without notifying the office, you may be dismissed from the clinic at the provider's discretion.      For prescription refill requests, have your pharmacy contact our office and allow 72 hours for refills to be completed.    Today you received the following chemotherapy and/or immunotherapy agents : Herceptin     To help prevent nausea and vomiting after your treatment, we encourage you to take your nausea medication as directed.  BELOW ARE SYMPTOMS THAT SHOULD BE REPORTED IMMEDIATELY: *FEVER GREATER THAN 100.4 F (38 C) OR HIGHER *CHILLS OR SWEATING *NAUSEA AND VOMITING THAT IS NOT CONTROLLED WITH YOUR NAUSEA MEDICATION *UNUSUAL SHORTNESS OF BREATH *UNUSUAL BRUISING OR BLEEDING *URINARY PROBLEMS (pain or burning when urinating, or frequent urination) *BOWEL PROBLEMS (unusual diarrhea, constipation, pain near the anus) TENDERNESS IN MOUTH AND THROAT WITH OR WITHOUT PRESENCE OF ULCERS (sore throat, sores in mouth, or a toothache) UNUSUAL RASH, SWELLING OR PAIN  UNUSUAL VAGINAL DISCHARGE OR ITCHING   Items with * indicate a potential emergency and should be followed up as soon as possible or go to the Emergency Department if any problems should occur.  Please show the CHEMOTHERAPY ALERT CARD or IMMUNOTHERAPY ALERT CARD at check-in to  the Emergency Department and triage nurse.  Should you have questions after your visit or need to cancel or reschedule your appointment, please contact [facility]  Dept: [phone]  and follow the prompts.  Office hours are 8:00 a.m. to 4:30 p.m. Monday - Friday. Please note that voicemails left after 4:00 p.m. may not be returned until the following business day.  We are closed weekends and major holidays. You have access to a nurse at all times for urgent questions. Please call the main number to the clinic Dept: [phone] and follow the prompts.   For any non-urgent questions, you may also contact your provider using MyChart. We now offer e-Visits for anyone 18 and older to request care online for non-urgent symptoms. For details visit [email or web address].   Also download the MyChart app! Go to the app store, search "MyChart", open the app, select , and log in with your MyChart username and password.  Due to Covid, a mask is required upon entering the hospital/clinic. If you do not have a mask, one will be given to you upon arrival. For doctor visits, patients may have 1 support person aged 18 or older with them. For treatment visits, patients cannot have anyone with them due to current Covid guidelines and our immunocompromised population.   

## 2021-09-11 NOTE — Progress Notes (Signed)
[facility]  [phone] [phone]     ID: [name] DOB: [birth year]  MR#: [id number]  TGY#:[id number]  Patient Care Team: Charlott Rakes, MD as PCP - General (Family Medicine) Larey Dresser, MD as PCP - Advanced Heart Failure (Cardiology) Kyung Rudd, MD as Consulting Physician (Radiation Oncology) Erline Levine, MD as Consulting Physician (Neurosurgery) Corena Pilgrim, MD as Consulting Physician (Psychiatry) Mickeal Skinner Acey Lav, MD as Consulting Physician (Psychiatry) Nehemiah Settle, MD as Referring Physician (Plastic Surgery) Irene Limbo, MD as Consulting Physician (Plastic Surgery) Dillingham, Loel Lofty, DO as Attending Physician (Plastic Surgery) Umeka Wrench, Virgie Dad, MD as Consulting Physician (Oncology) Merdis Delay, DMD (Dentistry)   CHIEF COMPLAINT: Metastatic triple positive breast cancer  CURRENT TREATMENT: trastuzumab [name] 21 days], [denosumab/Xgeva (Q6W)], anastrozole   INTERVAL HISTORY: Karinna returns today for follow up and treatment of her metastatic triple positive breast cancer.   Since her last visit here she had a PET scan on 08/19/2021 showing 1. No evidence of FDG avid metastatic disease. Osseous metastatic disease is not hypermetabolic. 2. Mild hypermetabolism within the thyroid. Consider laboratory correlation, as clinically indicated. 3. Aortic atherosclerosis (ICD10-I70.0).  She continues on trastuzumab, given every 21 days.  We switched back to this for maintenance after she took TDM-1 for 8 months.  Her most recent echo on 08/12/2021 showed an ejection fraction in the 55-60% range  She also continues on denosumab/Xgeva every 6 weeks.[name]  However this has been held due to a healing dental extraction.  Her last dose was 01/23/2021.  But we are not planning to renew it until after she has had clearance from oral surgery for her osteonecrosis problem.  She continues on anastrozole with good tolerance  except for vaginal dryness issues.  She has already participated in the pelvic rehab program.  Pap smear cytology 02/27/2021 was negative for HPV.  Screening right mammography 07/02/2021 showed breast density category C, no evidence of malignancy   REVIEW OF SYSTEMS: Nayla is now living in her own apartment.  She discusses is very peaceful and she likes her neighbors.  She has got herself a cat.  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  Every month or so she goes down to [facility] and visits her family and her ex-husband whom she describes as her best friend.  She is taking ibuprofen 800 mg twice a day for her chronic discomfort and this is discussed below.  A detailed below of systems was otherwise stable   COVID 19 VACCINATION STATUS: Status post Pfizer x2 with booster September 2021   BREAST CANCER HISTORY: From the original intake note:  Novice was aware of a "lemon sized lump in" her left axilla for about a year before bringing it to medical attention. By then she had developed left breast and left axillary swelling (June 2016). She presented to the local emergency room and had a chest CT scan 06/06/2015 which showed a nodule in the left breast measuring 0.9 cm and questionable left axillary adenopathy. She then proceeded to bilateral diagnostic mammography and left breast ultrasonography 06/19/2015. There were no prior films for comparison (last mammography 12 years prior).. The breast density was category C. Mammography showed in the left breast upper inner quadrant a 7 cm area including a small mass and significant pleomorphic calcifications. Ultrasonography defined the mass as measuring 1.2  cm. The left axilla appeared unremarkable. There was significant skin edema.  Biopsy of the left breast mass 06/19/2015 showed (SP [id number]) an invasive ductal carcinoma, grade  2, estrogen receptor 83% positive, progesterone receptor 26% positive, and HER-2 amplified by immunohistochemstry with a 3+ reading. The patient had biopsies of a separate area in the left breast August of the same year and this showed atypical ductal hyperplasia. (SP [id number]).  Accordingly after appropriate discussion on 08/21/2015 the patient proceeded to left mastectomy with left axillary sentinel lymph node sampling, which, since the lymph nodes were positive, extended to the procedure to left axillary lymph node dissection. The pathology ([id number]) showed an invasive ductal carcinoma, grade 3, measuring in excess of 9 cm. There were also skin satellites, not contiguous with the invasive carcinoma. Margins were clear and ample. There was evidence of lymphovascular invasion. A total of 15 lymph nodes were removed, including 5 sentinel lymph nodes, all of which were positive, so that the final total was 14 out of 15 lymph nodes involved by tumor. There was evidence of extranodal extension. The final pathology was pT4b pN2a, stage IIIB  CA-27-29 and CEA 09/19/2015 were non-informative October 2016.  Unfortunately CT scans of the chest abdomen and pelvis 09/16/2015 showed bony metastases to the ri/ght scapula, left iliac crest, and also L4 and T-spine. There were questionable liver cysts which on repeat CT scan 03/02/2016 appear to be a little bit more well-defined, possibly a little larger. There were also some possible right upper lobe lung lesions.  Adjuvant treatment consisted of docetaxel, trastuzumab and pertuzumab, with the final (6th) docetaxel dose given 02/11/2016. She continues on trastuzumab and pertuzumab, with the 11th cycle given 05/05/2016. Echocardiogram 02/26/2016 showed an ejection fraction of 55%. She receives denosumab/Xgeva every 4 weeks.. She also receives radiation, started 06/09/201, to be completed 06/26/2016.  Her subsequent history is as detailed below   PAST MEDICAL  HISTORY: Past Medical History:  Diagnosis Date   Alcohol abuse    none since 2013   Anemia    during chemo   Anxiety    At age 89   Arthritis Dx 2010   Bipolar disorder ([place])    Breast cancer ([facility])    Bronchitis    Cancer ([place])    breast mets to brain   CHF (congestive heart failure) ([place])    Chronic pain    resolved per patient 1/54/00   Complication of anesthesia    Depression    Family history of adverse reaction to anesthesia    MOther had PONV   GERD (gastroesophageal reflux disease)    Headache    hx  migraines   HLD (hyperlipidemia)    Hypertension    Lymphedema of left arm    Opiate dependence (H[facility])    PONV (postoperative nausea and vomiting)    Port-A-Cath in place    PTSD (post-traumatic stress disorder)    S/P endometrial ablation    in md's office    PAST SURGICAL HISTORY: Past Surgical History:  Procedure Laterality Date   APPLICATION OF CRANIAL NAVIGATION N/A 08/14/2016   Procedure: APPLICATION OF CRANIAL NAVIGATION;  Surgeon: Erline Levine, MD;  Location: [facility];  Service: Neurosurgery;  Laterality: N/A;   BREAST RECONSTRUCTION Left    with silicone implant   COLONOSCOPY W/ POLYPECTOMY     CRANIOTOMY N/A 08/14/2016   Procedure: CRANIOTOMY TUMOR EXCISION WITH Lucky Rathke;  Surgeon: Erline Levine, MD;  Location: [facility];  Service: Neurosurgery;  Laterality: N/A;  FIBULA FRACTURE SURGERY Left    MASTECTOMY Left    RADIOLOGY WITH ANESTHESIA N/A 07/23/2016   Procedure: MRI OF BRAIN WITH AND WITHOUT;  Surgeon: Medication Radiologist, MD;  Location: [facility];  Service: Radiology;  Laterality: N/A;   RADIOLOGY WITH ANESTHESIA N/A 09/08/2016   Procedure: MRI OF BRAIN WITH AND WITHOUT CONTRAST;  Surgeon: Medication Radiologist, MD;  Location: [facility];  Service: Radiology;  Laterality: N/A;   RADIOLOGY WITH ANESTHESIA N/A 12/10/2016   Procedure: MRI OF BRAIN WITH AND WITHOUT;  Surgeon: Medication Radiologist, MD;  Location: [facility];  Service: Radiology;   Laterality: N/A;   RADIOLOGY WITH ANESTHESIA N/A 03/02/2017   Procedure: MRI of BRAIN W and W/OUT CONTRAST;  Surgeon: Medication Radiologist, MD;  Location: [facility];  Service: Radiology;  Laterality: N/A;   RADIOLOGY WITH ANESTHESIA N/A 07/29/2017   Procedure: RADIOLOGY WITH ANESTHESIA MRI OF BRAIN WITH AND WITHOUT CONTRAST;  Surgeon: Radiologist, Medication, MD;  Location: [facility];  Service: Radiology;  Laterality: N/A;   RADIOLOGY WITH ANESTHESIA N/A 12/07/2017   Procedure: MRI WITH ANESTHESIA OF BRAIN WITH AND WITHOUT CONTRAST;  Surgeon: Radiologist, Medication, MD;  Location: [facility];  Service: Radiology;  Laterality: N/A;   RADIOLOGY WITH ANESTHESIA N/A 04/07/2018   Procedure: MRI OF BRAIN WITH AND WITHOUT CONTRAST;  Surgeon: Radiologist, Medication, MD;  Location: [facility];  Service: Radiology;  Laterality: N/A;   RADIOLOGY WITH ANESTHESIA N/A 08/23/2018   Procedure: MRI WITH ANESTHESIA OF THE BRAIN WITH AND WITHOUT;  Surgeon: Radiologist, Medication, MD;  Location: [facility];  Service: Radiology;  Laterality: N/A;   RADIOLOGY WITH ANESTHESIA N/A 01/24/2019   Procedure: MRI OF BRAIN WITH AND WITHOUT CONTRAST;  Surgeon: Radiologist, Medication, MD;  Location: [facility];  Service: Radiology;  Laterality: N/A;   RADIOLOGY WITH ANESTHESIA N/A 07/06/2019   Procedure: MRI WITH ANESTHESIA OF BRAIN WITH AND WITHOUT CONTRAST;  Surgeon: Radiologist, Medication, MD;  Location: [facility];  Service: Radiology;  Laterality: N/A;   RADIOLOGY WITH ANESTHESIA N/A 01/11/2020   Procedure: MRI WITH ANESTHESIA BRAIN WITH AND WITHOUT;  Surgeon: Radiologist, Medication, MD;  Location: [facility];  Service: Radiology;  Laterality: N/A;   RADIOLOGY WITH ANESTHESIA N/A 05/21/2020   Procedure: MRI WITH ANESTHESIA OF BRAIN WITH AND WITHOUT CONTRAST;  Surgeon: Radiologist, Medication, MD;  Location: [facility];  Service: Radiology;  Laterality: N/A;   RADIOLOGY WITH ANESTHESIA N/A 01/02/2021   Procedure: MRI WITH ANESTHESIA  BRAIN WITH AND WITHOUT CONTRAST,TOTAL  SPINE MET SCREENING;  Surgeon: Radiologist, Medication, MD;  Location: [facility];  Service: Radiology;  Laterality: N/A;   right power port placement Right     FAMILY HISTORY Family History  Problem Relation Age of Onset   Diabetes Mother    Bipolar disorder Mother    CAD Father    Breast cancer Neg Hx   The patient's father still living, age 34, in [place]. He had prostate cancer at some point in the past. The patient's mother died at age 103 from complications of diabetes. The patient had no brothers, 2 sisters. A paternal grandmother had lung cancer in the setting of tobacco abuse. There is no other history of cancer in the family to her knowledge   GYNECOLOGIC HISTORY:  No LMP recorded. Patient has had an ablation. Menarche approximately age 75. First live birth in 57. The patient is GX P2. She underwent endometrial ablation in 2016.   SOCIAL HISTORY: (Updated April 2022) The patient is originally from [place]. She moved to this area  from [place] because it was less expensive here she says.   She now has has her own 1 bedroom apartment on [address].  She is divorced. Her 2 children are [name] who lives in [place] and works as a Development worker, community, and Shady [place] who lives in [place] and works as Freight forwarder of a healthcare facility.  [name] has 2 children, Interior and spatial designer and Grayce Sessions.  He married in 2022, his wife Minette Brine, has 2 children of her own plus a step grandson).  [name] has no children.    ADVANCED DIRECTIVES: Not in place; at the 06/03/2016 visit the patient was given the appropriate forms to complete and notarize at her discretion   HEALTH MAINTENANCE: Social History   Tobacco Use   Smoking status: Former    Packs/day: 1.00    Years: 43.00    Pack years: 43.00    Types: Cigarettes   Smokeless tobacco: Never   Tobacco comments:    3 day since last cigarette  Vaping Use   Vaping Use: Some days  Substance Use Topics   Alcohol use: No    Comment: no ETOH since 08/22/12    Drug use: No    Comment: states she's in recovery program for 7 years     Colonoscopy:  PAP:  Bone density:   Allergies  Allergen Reactions   Demerol [Meperidine Hcl] Itching and Nausea And Vomiting    INTOLERANCE >  N & V   Erythromycin Rash    Current Outpatient Medications on File Prior to Visit  Medication Sig Dispense Refill   cyclobenzaprine (FLEXERIL) 10 MG tablet TAKE 1 TABLET BY MOUTH TWICE A DAY 60 tablet 1   anastrozole (ARIMIDEX) 1 MG tablet TAKE 1 TABLET BY MOUTH EVERY DAY 90 tablet 1   azelastine (ASTELIN) 0.1 % nasal spray Place 2 sprays into both nostrils daily. Use in each nostril as directed 30 mL 12   carvedilol (COREG) 3.125 MG tablet TAKE 1 TABLET (3.125 MG TOTAL) BY MOUTH 2 TIMES DAILY FOR 10 DAYS, THEN 2 TABLETS 2 TIMES DAILY. 120 tablet 3   cholecalciferol (VITAMIN D3) 25 MCG (1000 UNIT) tablet Take 2 tablets (2,000 Units total) by mouth daily. 90 tablet 4   docusate sodium (COLACE) 100 MG capsule TAKE 1 CAPSULE BY MOUTH ONCE A DAY 100 capsule 6   fluticasone (FLONASE) 50 MCG/ACT nasal spray SPRAY 2 SPRAYS INTO EACH NOSTRIL EVERY DAY 16 mL 2   ibuprofen (ADVIL) 800 MG tablet TAKE 1 TABLET BY MOUTH TWICE A DAY AS NEEDED FOR MODERATE PAIN. TAKE WITH FOOD. (Patient taking differently: Take 800 mg by mouth in the morning and at bedtime. Take with food.) 60 tablet 6   ketorolac (ACULAR) 0.5 % ophthalmic solution Place 1 drop into the left eye 4 (four) times daily.     lamoTRIgine (LAMICTAL) 100 MG tablet Take 100 mg by mouth every morning.     loratadine (CLARITIN) 10 MG tablet TAKE 1 TABLET BY MOUTH EVERY DAY 90 tablet 3   lubiprostone (AMITIZA) 8 MCG capsule Take 1 capsule (8 mcg total) by mouth 2 (two) times daily with a meal. 60 capsule 1   Lurasidone HCl 60 MG TABS Take 60 mg by mouth daily.     ondansetron (ZOFRAN) 8 MG tablet TAKE 1 TABLET BY MOUTH EVERY 8 HOURS AS NEEDED FOR NAUSEA AND VOMITING 90 tablet 1   pantoprazole (PROTONIX) 40 MG tablet TAKE 1  TABLET BY MOUTH EVERY DAY 90 tablet 3   polyethylene glycol (MIRALAX / GLYCOLAX)  17 g packet Take 17 g by mouth daily as needed for mild constipation. (Patient taking differently: Take 17 g by mouth every 3 (three) days.) 30 each 6   rosuvastatin (CRESTOR) 10 MG tablet TAKE 1 TABLET BY MOUTH EVERY DAY 90 tablet 3   traZODone (DESYREL) 100 MG tablet Take 1 tablet (100 mg total) by mouth at bedtime. (Patient taking differently: Take 300 mg by mouth at bedtime.) 30 tablet 0   No current facility-administered medications on file prior to visit.    OBJECTIVE: White woman in no acute distress Vitals:   09/11/21 1404  BP: 131/75  Pulse: (!) 108  Resp: 18  Temp: (!) 97.5 F (36.4 C)  TempSrc: Tympanic  SpO2: 98%  Weight: 155 lb 12.8 oz (70.7 kg)  Height: 5' 5"  (1.651 m)  Body mass index is 25.93 kg/m.  ECOG FS: 2   Sclerae unicteric, EOMs intact Wearing a mask No cervical or supraclavicular adenopathy Lungs no rales or rhonchi Heart regular rate and rhythm Abd soft, nontender, positive bowel sounds MSK no focal spinal tenderness, no upper extremity lymphedema Neuro: nonfocal, well oriented, appropriate affect Breasts: The right breast is unremarkable.  The left breast is status post mastectomy with saline reconstruction.  There is no evidence of local recurrence.  Both axillae are benign.  LAB RESULTS: No results found for: LABCA2  CBC    Component Value Date/Time   WBC 4.0 08/21/2021 0815   WBC 3.8 (L) 03/27/2021 0941   RBC 3.97 08/21/2021 0815   HGB 11.8 (L) 08/21/2021 0815   HGB 12.1 10/26/2017 1038   HCT 35.6 (L) 08/21/2021 0815   HCT 35.7 10/26/2017 1038   PLT 131 (L) 08/21/2021 0815   PLT 167 10/26/2017 1038   MCV 89.7 08/21/2021 0815   MCV 98.4 10/26/2017 1038   MCH 29.7 08/21/2021 0815   MCHC 33.1 08/21/2021 0815   RDW 13.3 08/21/2021 0815   RDW 13.3 10/26/2017 1038   LYMPHSABS 1.0 08/21/2021 0815   LYMPHSABS 0.5 (L) 10/26/2017 1038   MONOABS 0.4 08/21/2021  0815   MONOABS 0.1 10/26/2017 1038   EOSABS 0.0 08/21/2021 0815   EOSABS 0.0 10/26/2017 1038   BASOSABS 0.0 08/21/2021 0815   BASOSABS 0.0 10/26/2017 1038   CMP Latest Ref Rng & Units 08/21/2021 07/17/2021 06/05/2021  Glucose 70 - 99 mg/dL 106(H) 102(H) 100(H)  BUN 6 - 20 mg/dL 13 11 10   Creatinine 0.44 - 1.00 mg/dL 1.17(H) 1.17(H) 0.93  Sodium 135 - 145 mmol/L 140 138 136  Potassium 3.5 - 5.1 mmol/L 4.0 3.9 4.3  Chloride 98 - 111 mmol/L 100 99 98  CO2 22 - 32 mmol/L 29 28 29   Calcium 8.9 - 10.3 mg/dL 10.5(H) 9.9 9.4  Total Protein 6.5 - 8.1 g/dL 7.2 6.9 6.8  Total Bilirubin 0.3 - 1.2 mg/dL 0.4 0.3 0.2(L)  Alkaline Phos 38 - 126 U/L 81 83 78  AST 15 - 41 U/L 23 28 41  ALT 0 - 44 U/L 18 27 37    STUDIES: NM PET Image Restag (PS) Skull Base To Thigh  Result Date: 08/20/2021 CLINICAL DATA:  Subsequent treatment strategy for breast cancer. EXAM: NUCLEAR MEDICINE PET SKULL BASE TO THIGH TECHNIQUE: 7.5 mCi F-18 FDG was injected intravenously. Full-ring PET imaging was performed from the skull base to thigh after the radiotracer. CT data was obtained and used for attenuation correction and anatomic localization. Fasting blood glucose: 98 mg/dl COMPARISON:  CT chest 01/20/2021, MR total spine 01/02/2021 and PET  01/19/2019. FINDINGS: Mediastinal blood pool activity: SUV max 2.6 Liver activity: SUV max NA NECK: No abnormal hypermetabolism.  Slight misregistration artifact. Incidental CT findings: None. CHEST: No hypermetabolic mediastinal, hilar, axillary or internal mammary lymph nodes. No hypermetabolic pulmonary nodules. Mild hypermetabolism within the thyroid. Incidental CT findings: Right IJ Port-A-Cath terminates at the SVC RA junction. Heart is enlarged. No pericardial or pleural effusion. ABDOMEN/PELVIS: No abnormal hypermetabolism in the liver, adrenal glands, spleen or pancreas. No hypermetabolic lymph nodes. Incidental CT findings: Millimetric low-attenuation lesion in the posterior right  hepatic lobe, too small to characterize. Liver, gallbladder, adrenal glands, kidneys, spleen, pancreas, stomach and bowel are grossly unremarkable. Atherosclerotic calcification of the aorta. Small umbilical hernia contains fat. SKELETON: No abnormal hypermetabolism. Specifically, a T9 metastasis discussed on 01/02/2021 does not show abnormal hypermetabolism, nor do multiple scattered sclerotic lesions throughout the visualized osseous structures. Incidental CT findings: None. IMPRESSION: 1. No evidence of FDG avid metastatic disease. Osseous metastatic disease is not hypermetabolic. 2. Mild hypermetabolism within the thyroid. Consider laboratory correlation, as clinically indicated. 3. Aortic atherosclerosis (ICD10-I70.0). Electronically Signed   By: Lorin Picket M.D.   On: 08/20/2021 08:21     ELIGIBLE FOR AVAILABLE RESEARCH PROTOCOL: no  ASSESSMENT: 60 y.o. [place] woman with stage IV left-sided breast cancer involving bone and central nervous system  (1) s/p left breast lower inner quadrant biopsy 06/19/2015 for a clinical T2-3 NX invasive ductal carcinoma, grade 2, triple positive.  (2) status post left mastectomy and axillary lymph node dissection  with immediate expander placement 07/18/2015 for an mpT4 pN2,stage IIIB invasive ductal carcinoma, grade 3, with negative margins.  (a) definitive implant exchange to be scheduled in December   METASTATIC DISEASE: October 2016  (3) CT scan of the chest abdomen and pelvis  09/16/2015 shows metastatic lesions in the right scapula, left iliac crest, L4, and T spine. There were questionable liver cysts, with repeat CT scan 03/02/2016 showing possible right upper lobe lung lesions and possibly increased liver lesions  (a) CT scan of the chest 06/17/2016 shows no active disease in the lungs or liver  (b) Bone scan July 2017 showed no evidence of bony metastatic disease   (c) head CT 07/08/2016 showed a cerebellar lesion, confirmed by MRI 07/23/2016,  status post craniotomy 08/14/2016, confirming a metastatic deposit which was estrogen and progesterone receptor negative, HER-2 amplified with a signals ratio of 7.16, number per cell 13.25  (d) CA 27-29 is not informative  (4) received docetaxel every 3 weeks 6 together with trastuzumab and pertuzumab, last docetaxel dose 02/11/2016  (5) adjuvant radiation7/03/2016 to 06/26/2016 at [facility]: 1. The Left chest wall was treated to 23.4 Gy in 13 fractions at 1.8 Gy per fraction. 2. The Left chest wall was boosted to 10 Gy in 5 fractions at 2 Gy per fraction. 3. The Left Sclav/PAB was treated to 23.4 Gy in 13 fractions at 1.8 Gy per fraction.   [Note: Including the patient's treatment in [facility] (received 15 fractions per Dr. Maryan Rued near [place], [place]), the patient received 50.4 Gy to the left chest wall and supraclavicular region. ]  (6) started trastuzumab and pertuzumab October 2016, continuing every 3 weeks,  (a) echocardiogram 02/26/2016 showed a well preserved ejection fraction  (b) echocardiogram 07/01/2016 shows an ejection fraction in the 60-65%   (c) pertuzumab discontinued 10/2016 with uncontrolled diarrhea  (d) echocardiogram 11/11/2016 showed an ejection fraction in the 60-65%  (e) echocardiogram 03/03/2017 shows an ejection fraction of 60-65%  (f) echocardiogram on 05/19/2017  shows an ejection fraction of 55-60%  (g) echocardiogram 09/24/2017 shows the ejection fraction in the 60-65%  (h) echocardiogram 02/14/2018 shows an ejection fraction in the 60-65%  (I) echocardiogram  06/30/2018 shows an ejection fraction in the 55-60%  (m) echocardiogram on 12/08/2018 shows an ejection fraction in the 60-65% range  (7) started denosumab/Xgeva October 2017 given every 4 weeks, transitioned to every 8 weeks beginning 10/11/18 (every 6 weeks while giving TDM1 every 3 weeks)  (a) Xgeva held after 08/21/2020 dose due to dental concerns  (8) started anastrozole October 2017   (a) bone scan  11/10/2016 shows no active disease  (b) chest CT scan 11/10/2016 stable, with no evidence of active disease  (c) chest CT and bone scan 07/02/2017 show no evidence of active disease  (d) CT scan of the chest with contrast 11/10/2017 shows some left axillary edema, but no evidence of thrombosis or adenopathy in that area, 0.9 cm precarinal lymph and 0.7 cm right upper lobe nodule node; bone lesions stable  (e) CT of the chest 05/04/2018 shows a 1.4 cm right lower paratracheal node which is slightly increased and a new right prevascular mediastinal node measuring 0.7 cm.  Bone lesions are stable.  (f) chest CT on 07/01/2018 shows no definite findings of metastatic disease in the thorax. Previously noted borderline enlarged low right paratracheal lymph node is stable to slightly decreased in size   (e) chest CT on 12/30/2018 notes mild increase in right paratracheal adneopathy, recommended PET scan.  Pet scan on 01/19/2019 showed a hypermetabolic and enlarged right paratracheal lymph node consistent with breast cancer recurrence.  (f)Trastuzumab discontinued due to February 2020 scans   (g) TDM-1 started on 01/31/2019 given every 21 days.  (h) Chest CT 05/15/2019 shows decrease in mediastinal adenopathy  (i) CT chest on 08/17/2019 shows resolution of thoracic adenopathy  (j) TDM 1 discontinued after 10/11/2019 dose (8 months treatment) because of thrombocytopenia  (k) trastuzumab resumed 11/01/2019, repeated every 21 days  (l) echocardiogram 12/06/2019 showed an ejection fraction in the 60-65% range  (m) echo 08/12/2021 shows an ejection fraction in the 55-60% range.   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  (a) brain irradiation10/19/17-11/08/17: Whole brain/ 35 Gy in 14 fractions   (b) repeat brain MRI obtained 12/10/2016 shows no active disease in the brain  (c) repeat brain MRI 03/02/2017 shows no evidence of residual or recurrent disease  (d) repeat brain MRI 07/29/2017 shows no evidence of residual or recurrent disease  (e) repeat brain MRI 12/07/2017 shows no evidence of disease recurrence.  There is progressive white matter change secondary to prior treatment.  (f) repeat brain MRI 04/07/2018 showed no evidence of disease\  (g) repeat brain MRI on 08/23/2018 shows no evidence of disease  (h) repeat brain MRI on 01/25/2019 shows no evidence of disease  (I) brain MRI 07/06/2019 shows no evidence of active disease  (j) brain MRI 01/11/2020 shows a subdural hematoma measuring 0.8 cm but no evidence of recurrent metastatic disease  (k) brain MRI 05/21/2020 no evidence of recurrent disease; chronic changes c/w prior treatment and recent subdural (otherwise resolved)  (l) brain MRI 01/02/2021 shows no  evidence of recurrent disease.  There is some dural thickening in the area of prior subdural hematoma and continued follow-up is recommended.  (13) peripheral disease monitoring:  (A) chest CT scan, brain CT and bone scan 01/31/2020 show no evidence of active disease  (B) CT chest with contrast 06/10/2020 showed no evidence of actice/progressive disease (C) MRI total spine 01/03/2021 shows no enhancing bony mets, prior sclerotic lesions not well visualized  (D) CT of the chest 01/20/2021 stable, with unchanged bony metastasis, scattered subcentimeter low-attenuation liver lesions too small to characterize, and no measurable disease.  (E) PET scan 08/20/2021 shows no progression or active disease   PLAN: Jelisa is now 6 years out from definitive diagnosis of metastatic breast cancer with multiple sites of disease including the brain.  Her disease is very  well controlled, with no evidence of activity.  She is also tolerating treatment well, with a normal echocardiogram and good laboratories.  Accordingly the plan is to continue treatment as before.  We are not using denosumab/Xgeva at present because of a history of osteonecrosis of the jaw.  This will need to be reviewed once she sees her oral surgeon next.  She is due for repeat brain MRI.  I have tried to schedule this sometime this month but she needs to be "put out completely" and there are only limited slots for that.  Hopefully at that will be done before she returns to see me in 9 weeks.  Am concerned that her ibuprofen use may be excessive.  I have asked her to get off the 800 mg Motrin.  I have suggested she take naproxen 250 together with Tylenol 500 to at most 3 times daily instead.  I wrote those orders.  However those are over-the-counter medications and she needs to pay cash for those whereas she can get the ibuprofen 800 mg for $4 because it is a prescription.  We will see how she is doing on the suggested change when she sees me again at the next visit  Total encounter time 35 minutes.* Total encounter time 55 minutes.Sarajane Jews C. Seaton Hofmann, MD 09/11/21 2:07 PM Medical Oncology and Hematology [facility] [place], [place] [address] Tel. [phone]    Fax. [phone]   I, Wilburn Mylar, am acting as scribe for Dr. Virgie Dad. Rueben Kassim.  I, Lurline Del MD, have reviewed the above documentation for accuracy and completeness, and I agree with the above.    *Total Encounter Time as defined by the Centers for Medicare and Medicaid Services includes, in addition to the face-to-face time of a patient visit (documented in the note above) non-face-to-face time: obtaining and reviewing outside history, ordering and reviewing medications, tests or procedures, care coordination (communications with other health care professionals or caregivers) and  documentation in the medical record.

## 2021-09-13 ENCOUNTER — Other Ambulatory Visit: Payer: Self-pay | Admitting: Oncology

## 2021-09-15 ENCOUNTER — Encounter: Payer: Self-pay | Admitting: Oncology

## 2021-09-22 ENCOUNTER — Other Ambulatory Visit: Payer: Self-pay | Admitting: Family Medicine

## 2021-09-22 DIAGNOSIS — K5909 Other constipation: Secondary | ICD-10-CM

## 2021-09-22 NOTE — Telephone Encounter (Signed)
Requested Prescriptions  Pending Prescriptions Disp Refills  . AMITIZA 8 MCG capsule [Pharmacy Med Name: AMITIZA 8 MCG CAPSULE] 60 capsule 1    Sig: TAKE 1 CAPSULE (8 MCG TOTAL) BY MOUTH 2 (TWO) TIMES DAILY WITH A MEAL.     Gastroenterology: Irritable Bowel Syndrome Passed - 09/22/2021 11:36 AM      Passed - Valid encounter within last 12 months    Recent Outpatient Visits          2 months ago Other constipation   [place], Enobong, MD   6 months ago Annual physical exam   [place], Enobong, MD   1 year ago Acute non-recurrent sinusitis of other sinus   [place], Enobong, MD   1 year ago Other constipation   [facility] Charlott Rakes, MD   2 years ago Metastatic breast cancer [facility])   [facility] Charlott Rakes, MD

## 2021-09-23 ENCOUNTER — Encounter: Payer: Self-pay | Admitting: Oncology

## 2021-09-25 ENCOUNTER — Other Ambulatory Visit: Payer: Self-pay | Admitting: Radiation Therapy

## 2021-10-02 ENCOUNTER — Inpatient Hospital Stay: Payer: Medicaid Other

## 2021-10-02 ENCOUNTER — Telehealth: Payer: Self-pay

## 2021-10-02 NOTE — Telephone Encounter (Signed)
Pt was [place] for appt today at [facility]. Pt was r/s for 11/4 at 1015, pt knows she is to arrive at 1000 for check in. I advised pt to write this down as to not forget, pt states she wrote it down.

## 2021-10-03 ENCOUNTER — Other Ambulatory Visit: Payer: Medicaid Other

## 2021-10-03 ENCOUNTER — Inpatient Hospital Stay: Payer: Medicaid Other | Attending: Medical

## 2021-10-03 ENCOUNTER — Inpatient Hospital Stay: Payer: Medicaid Other

## 2021-10-03 ENCOUNTER — Other Ambulatory Visit: Payer: Self-pay

## 2021-10-03 VITALS — BP 141/85 | HR 88 | Temp 98.0°F | Resp 18 | Wt 155.5 lb

## 2021-10-03 DIAGNOSIS — C770 Secondary and unspecified malignant neoplasm of lymph nodes of head, face and neck: Secondary | ICD-10-CM | POA: Diagnosis not present

## 2021-10-03 DIAGNOSIS — Z79899 Other long term (current) drug therapy: Secondary | ICD-10-CM | POA: Diagnosis not present

## 2021-10-03 DIAGNOSIS — Z5112 Encounter for antineoplastic immunotherapy: Secondary | ICD-10-CM | POA: Insufficient documentation

## 2021-10-03 DIAGNOSIS — C50312 Malignant neoplasm of lower-inner quadrant of left female breast: Secondary | ICD-10-CM

## 2021-10-03 DIAGNOSIS — C7931 Secondary malignant neoplasm of brain: Secondary | ICD-10-CM | POA: Insufficient documentation

## 2021-10-03 DIAGNOSIS — C7951 Secondary malignant neoplasm of bone: Secondary | ICD-10-CM | POA: Diagnosis present

## 2021-10-03 DIAGNOSIS — D696 Thrombocytopenia, unspecified: Secondary | ICD-10-CM

## 2021-10-03 DIAGNOSIS — Z95828 Presence of other vascular implants and grafts: Secondary | ICD-10-CM

## 2021-10-03 DIAGNOSIS — C50119 Malignant neoplasm of central portion of unspecified female breast: Secondary | ICD-10-CM

## 2021-10-03 LAB — CMP (CANCER CENTER ONLY)
ALT: 19 U/L (ref 0–44)
AST: 29 U/L (ref 15–41)
Albumin: 4 g/dL (ref 3.5–5.0)
Alkaline Phosphatase: 75 U/L (ref 38–126)
Anion gap: 9 (ref 5–15)
BUN: 9 mg/dL (ref 6–20)
CO2: 29 mmol/L (ref 22–32)
Calcium: 9.2 mg/dL (ref 8.9–10.3)
Chloride: 103 mmol/L (ref 98–111)
Creatinine: 0.98 mg/dL (ref 0.44–1.00)
GFR, Estimated: >60 mL/min (ref >60)
Glucose, Bld: 97 mg/dL (ref 70–99)
Potassium: 3.9 mmol/L (ref 3.5–5.1)
Sodium: 141 mmol/L (ref 135–145)
Total Bilirubin: 0.3 mg/dL (ref 0.3–1.2)
Total Protein: 6.8 g/dL (ref 6.5–8.1)

## 2021-10-03 LAB — CBC WITH DIFFERENTIAL (CANCER CENTER ONLY)
Abs Immature Granulocytes: 0.02 10*3/uL (ref 0.00–0.07)
Basophils Absolute: 0 10*3/uL (ref 0.0–0.1)
Basophils Relative: 0 %
Eosinophils Absolute: 0 10*3/uL (ref 0.0–0.5)
Eosinophils Relative: 0 %
HCT: 33.5 % — ABNORMAL LOW (ref 36.0–46.0)
Hemoglobin: 10.8 g/dL — ABNORMAL LOW (ref 12.0–15.0)
Immature Granulocytes: 0 %
Lymphocytes Relative: 25 %
Lymphs Abs: 1.2 10*3/uL (ref 0.7–4.0)
MCH: 29.2 pg (ref 26.0–34.0)
MCHC: 32.2 g/dL (ref 30.0–36.0)
MCV: 90.5 fL (ref 80.0–100.0)
Monocytes Absolute: 0.3 10*3/uL (ref 0.1–1.0)
Monocytes Relative: 7 %
Neutro Abs: 3.1 10*3/uL (ref 1.7–7.7)
Neutrophils Relative %: 68 %
Platelet Count: 131 10*3/uL — ABNORMAL LOW (ref 150–400)
RBC: 3.7 MIL/uL — ABNORMAL LOW (ref 3.87–5.11)
RDW: 13.2 % (ref 11.5–15.5)
WBC Count: 4.6 10*3/uL (ref 4.0–10.5)
nRBC: 0 % (ref 0.0–0.2)

## 2021-10-03 MED ORDER — SODIUM CHLORIDE 0.9 % IV SOLN: Freq: Once | INTRAVENOUS | Status: AC

## 2021-10-03 MED ORDER — TRASTUZUMAB-DKST CHEMO 150 MG IV SOLR
6.0000 mg/kg | Freq: Once | INTRAVENOUS | Status: AC
  Administered 2021-10-03: 399 mg via INTRAVENOUS
  Filled 2021-10-03: qty 19

## 2021-10-03 MED ORDER — ACETAMINOPHEN 325 MG PO TABS
650.0000 mg | ORAL_TABLET | Freq: Once | ORAL | Status: AC
  Administered 2021-10-03: 650 mg via ORAL
  Filled 2021-10-03: qty 2

## 2021-10-03 MED ORDER — SODIUM CHLORIDE 0.9% FLUSH: 10.0000 mL | INTRAVENOUS | Status: DC | PRN

## 2021-10-03 MED ORDER — SODIUM CHLORIDE 0.9% FLUSH
10.0000 mL | INTRAVENOUS | Status: DC | PRN
  Administered 2021-10-03: 10 mL via INTRAVENOUS

## 2021-10-03 MED ORDER — SODIUM CHLORIDE 0.9% FLUSH
10.0000 mL | Freq: Once | INTRAVENOUS | Status: AC
  Administered 2021-10-03: 10 mL via INTRAVENOUS

## 2021-10-03 MED ORDER — HEPARIN SOD (PORK) LOCK FLUSH 100 UNIT/ML IV SOLN
500.0000 [IU] | Freq: Once | INTRAVENOUS | Status: AC | PRN
  Administered 2021-10-03: 500 [IU]

## 2021-10-03 NOTE — Patient Instructions (Signed)
[facility]  Discharge Instructions: Thank you for choosing [facility] to provide your oncology and hematology care.   If you have a lab appointment with the Cancer Center, please go directly to the Cancer Center and check in at the registration area.   Wear comfortable clothing and clothing appropriate for easy access to any Portacath or PICC line.   We strive to give you quality time with your provider. You may need to reschedule your appointment if you arrive late (15 or more minutes).  Arriving late affects you and other patients whose appointments are after yours.  Also, if you miss three or more appointments without notifying the office, you may be dismissed from the clinic at the provider's discretion.      For prescription refill requests, have your pharmacy contact our office and allow 72 hours for refills to be completed.    Today you received the following chemotherapy and/or immunotherapy agents trastuzumab      To help prevent nausea and vomiting after your treatment, we encourage you to take your nausea medication as directed.  BELOW ARE SYMPTOMS THAT SHOULD BE REPORTED IMMEDIATELY: *FEVER GREATER THAN 100.4 F (38 C) OR HIGHER *CHILLS OR SWEATING *NAUSEA AND VOMITING THAT IS NOT CONTROLLED WITH YOUR NAUSEA MEDICATION *UNUSUAL SHORTNESS OF BREATH *UNUSUAL BRUISING OR BLEEDING *URINARY PROBLEMS (pain or burning when urinating, or frequent urination) *BOWEL PROBLEMS (unusual diarrhea, constipation, pain near the anus) TENDERNESS IN MOUTH AND THROAT WITH OR WITHOUT PRESENCE OF ULCERS (sore throat, sores in mouth, or a toothache) UNUSUAL RASH, SWELLING OR PAIN  UNUSUAL VAGINAL DISCHARGE OR ITCHING   Items with * indicate a potential emergency and should be followed up as soon as possible or go to the Emergency Department if any problems should occur.  Please show the CHEMOTHERAPY ALERT CARD or IMMUNOTHERAPY ALERT CARD at check-in to  the Emergency Department and triage nurse.  Should you have questions after your visit or need to cancel or reschedule your appointment, please contact [facility]  Dept: [phone]  and follow the prompts.  Office hours are 8:00 a.m. to 4:30 p.m. Monday - Friday. Please note that voicemails left after 4:00 p.m. may not be returned until the following business day.  We are closed weekends and major holidays. You have access to a nurse at all times for urgent questions. Please call the main number to the clinic Dept: [phone] and follow the prompts.   For any non-urgent questions, you may also contact your provider using MyChart. We now offer e-Visits for anyone 18 and older to request care online for non-urgent symptoms. For details visit [email or web address].   Also download the MyChart app! Go to the app store, search "MyChart", open the app, select [facility], and log in with your MyChart username and password.  Due to Covid, a mask is required upon entering the hospital/clinic. If you do not have a mask, one will be given to you upon arrival. For doctor visits, patients may have 1 support person aged 18 or older with them. For treatment visits, patients cannot have anyone with them due to current Covid guidelines and our immunocompromised population.   

## 2021-10-05 ENCOUNTER — Encounter (HOSPITAL_COMMUNITY): Payer: Self-pay

## 2021-10-05 ENCOUNTER — Other Ambulatory Visit: Payer: Self-pay

## 2021-10-05 NOTE — Progress Notes (Signed)
PCP - Dr. Margarita Rana  Cardiologist - Denies  EP-Denies  Endocrine-Denies  Pulm-Denies  Chest x-ray - Denies  EKG - 10/07/21 Day of surgery  Stress Test - Denies  ECHO - 08/12/21 (E)  Cardiac Cath - Denies  AICD-na PM-na LOOP-na  Dialysis-Denies  Sleep Study - Denies CPAP - Denies  LABS- 10/03/21 (E): CBCw/D, CMP  ASA-Denies  ERAS- No  HA1C-Denies  Anesthesia- No  Pt denies having chest pain, sob, or fever during the pre-op phone call. All instructions explained to the pt, with a verbal understanding of the material including: as of today,  stop taking all Aspirin (unless instructed by your doctor) and Other Aspirin containing products, Vitamins, Fish oils, and Herbal medications. Also stop all NSAIDS i.e. Advil, Ibuprofen, Motrin, Aleve, Anaprox, Naproxen, BC, Goody Powders, and all Supplements. Pt also instructed to wear a mask and social distance if she has to go out prior to her procedure. The opportunity to ask questions was provided.    Coronavirus Screening  Have you experienced the following symptoms:  Cough yes/no: No Fever (>100.21F)  yes/no: No Runny nose yes/no: No Sore throat yes/no: No Difficulty breathing/shortness of breath  yes/no: No  Have you or a family member traveled in the last 14 days and where? yes/no: No   If the patient indicates "YES" to the above questions, their PAT will be rescheduled to limit the exposure to others and, the surgeon will be notified. THE PATIENT WILL NEED TO BE ASYMPTOMATIC FOR 14 DAYS.   If the patient is not experiencing any of these symptoms, the PAT nurse will instruct them to NOT bring anyone with them to their appointment since they may have these symptoms or traveled as well.   Please remind your patients and families that hospital visitation restrictions are in effect and the importance of the restrictions.

## 2021-10-06 ENCOUNTER — Other Ambulatory Visit: Payer: Self-pay | Admitting: Oncology

## 2021-10-07 ENCOUNTER — Encounter (HOSPITAL_COMMUNITY): Payer: Self-pay | Admitting: Oncology

## 2021-10-07 ENCOUNTER — Other Ambulatory Visit: Payer: Self-pay

## 2021-10-07 ENCOUNTER — Ambulatory Visit (HOSPITAL_COMMUNITY)
Admission: RE | Admit: 2021-10-07 | Discharge: 2021-10-07 | Disposition: A | Discharge status: Home / Self Care | Payer: Medicaid Other | Attending: Oncology | Admitting: Oncology

## 2021-10-07 ENCOUNTER — Encounter (HOSPITAL_COMMUNITY): Admission: RE | Disposition: A | Discharge status: Home / Self Care | Payer: Self-pay | Source: Home / Self Care

## 2021-10-07 ENCOUNTER — Ambulatory Visit (HOSPITAL_COMMUNITY)
Admission: RE | Admit: 2021-10-07 | Discharge: 2021-10-07 | Disposition: A | Discharge status: Home / Self Care | Payer: Medicaid Other | Source: Ambulatory Visit | Attending: Oncology | Admitting: Oncology

## 2021-10-07 ENCOUNTER — Ambulatory Visit (HOSPITAL_COMMUNITY): Payer: Medicaid Other | Admitting: Certified Registered Nurse Anesthetist

## 2021-10-07 ENCOUNTER — Ambulatory Visit (HOSPITAL_COMMUNITY): Admission: RE | Admit: 2021-10-07 | Payer: Medicaid Other | Source: Ambulatory Visit

## 2021-10-07 DIAGNOSIS — M47816 Spondylosis without myelopathy or radiculopathy, lumbar region: Secondary | ICD-10-CM | POA: Insufficient documentation

## 2021-10-07 DIAGNOSIS — Z170 Estrogen receptor positive status [ER+]: Secondary | ICD-10-CM | POA: Diagnosis not present

## 2021-10-07 DIAGNOSIS — C7931 Secondary malignant neoplasm of brain: Secondary | ICD-10-CM | POA: Insufficient documentation

## 2021-10-07 DIAGNOSIS — C7951 Secondary malignant neoplasm of bone: Secondary | ICD-10-CM

## 2021-10-07 DIAGNOSIS — C50312 Malignant neoplasm of lower-inner quadrant of left female breast: Secondary | ICD-10-CM

## 2021-10-07 DIAGNOSIS — J341 Cyst and mucocele of nose and nasal sinus: Secondary | ICD-10-CM | POA: Diagnosis not present

## 2021-10-07 DIAGNOSIS — C50011 Malignant neoplasm of nipple and areola, right female breast: Secondary | ICD-10-CM

## 2021-10-07 DIAGNOSIS — C770 Secondary and unspecified malignant neoplasm of lymph nodes of head, face and neck: Secondary | ICD-10-CM

## 2021-10-07 HISTORY — PX: RADIOLOGY WITH ANESTHESIA: SHX6223

## 2021-10-07 SURGERY — MRI WITH ANESTHESIA
Anesthesia: General

## 2021-10-07 MED ORDER — DIPHENHYDRAMINE HCL 50 MG/ML IJ SOLN
INTRAMUSCULAR | Status: AC
  Filled 2021-10-07: qty 1

## 2021-10-07 MED ORDER — ROCURONIUM BROMIDE 10 MG/ML (PF) SYRINGE
PREFILLED_SYRINGE | INTRAVENOUS | Status: DC | PRN
  Administered 2021-10-07: 20 mg via INTRAVENOUS
  Administered 2021-10-07: 50 mg via INTRAVENOUS

## 2021-10-07 MED ORDER — MIDAZOLAM HCL 2 MG/2ML IJ SOLN
INTRAMUSCULAR | Status: DC | PRN
  Administered 2021-10-07: 2 mg via INTRAVENOUS

## 2021-10-07 MED ORDER — PROPOFOL 10 MG/ML IV BOLUS
INTRAVENOUS | Status: AC
  Filled 2021-10-07: qty 20

## 2021-10-07 MED ORDER — PHENYLEPHRINE 40 MCG/ML (10ML) SYRINGE FOR IV PUSH (FOR BLOOD PRESSURE SUPPORT)
PREFILLED_SYRINGE | INTRAVENOUS | Status: DC | PRN
  Administered 2021-10-07: 80 ug via INTRAVENOUS

## 2021-10-07 MED ORDER — DEXAMETHASONE SODIUM PHOSPHATE 10 MG/ML IJ SOLN
INTRAMUSCULAR | Status: AC
  Filled 2021-10-07: qty 1

## 2021-10-07 MED ORDER — ONDANSETRON HCL 4 MG/2ML IJ SOLN
INTRAMUSCULAR | Status: DC | PRN
  Administered 2021-10-07: 4 mg via INTRAVENOUS

## 2021-10-07 MED ORDER — LIDOCAINE 2% (20 MG/ML) 5 ML SYRINGE
INTRAMUSCULAR | Status: DC | PRN
  Administered 2021-10-07: 40 mg via INTRAVENOUS

## 2021-10-07 MED ORDER — PHENYLEPHRINE HCL-NACL 20-0.9 MG/250ML-% IV SOLN
INTRAVENOUS | Status: DC | PRN
  Administered 2021-10-07: 40 ug/min via INTRAVENOUS

## 2021-10-07 MED ORDER — EPHEDRINE SULFATE-NACL 50-0.9 MG/10ML-% IV SOSY
PREFILLED_SYRINGE | INTRAVENOUS | Status: DC | PRN
  Administered 2021-10-07 (×2): 10 mg via INTRAVENOUS

## 2021-10-07 MED ORDER — DEXAMETHASONE SODIUM PHOSPHATE 10 MG/ML IJ SOLN
INTRAMUSCULAR | Status: DC | PRN
  Administered 2021-10-07: 5 mg via INTRAVENOUS

## 2021-10-07 MED ORDER — DIPHENHYDRAMINE HCL 50 MG/ML IJ SOLN
INTRAMUSCULAR | Status: DC | PRN
  Administered 2021-10-07: 12.5 mg via INTRAVENOUS

## 2021-10-07 MED ORDER — FENTANYL CITRATE (PF) 100 MCG/2ML IJ SOLN: 25.0000 ug | INTRAMUSCULAR | Status: DC | PRN

## 2021-10-07 MED ORDER — OXYCODONE HCL 5 MG/5ML PO SOLN: 5.0000 mg | Freq: Once | ORAL | Status: DC | PRN

## 2021-10-07 MED ORDER — GADOBUTROL 1 MMOL/ML IV SOLN
7.0000 mL | Freq: Once | INTRAVENOUS | Status: AC | PRN
  Administered 2021-10-07: 7 mL via INTRAVENOUS

## 2021-10-07 MED ORDER — ONDANSETRON HCL 4 MG/2ML IJ SOLN
INTRAMUSCULAR | Status: AC
  Filled 2021-10-07: qty 2

## 2021-10-07 MED ORDER — FENTANYL CITRATE (PF) 100 MCG/2ML IJ SOLN
INTRAMUSCULAR | Status: DC | PRN
  Administered 2021-10-07: 25 ug via INTRAVENOUS
  Administered 2021-10-07: 50 ug via INTRAVENOUS
  Administered 2021-10-07: 25 ug via INTRAVENOUS

## 2021-10-07 MED ORDER — LACTATED RINGERS IV SOLN: INTRAVENOUS | Status: DC

## 2021-10-07 MED ORDER — SUGAMMADEX SODIUM 200 MG/2ML IV SOLN
INTRAVENOUS | Status: DC | PRN
  Administered 2021-10-07: 200 mg via INTRAVENOUS

## 2021-10-07 MED ORDER — ORAL CARE MOUTH RINSE: 15.0000 mL | Freq: Once | OROMUCOSAL | Status: AC

## 2021-10-07 MED ORDER — PROPOFOL 10 MG/ML IV BOLUS
INTRAVENOUS | Status: DC | PRN
  Administered 2021-10-07: 120 mg via INTRAVENOUS

## 2021-10-07 MED ORDER — CHLORHEXIDINE GLUCONATE 0.12 % MT SOLN
15.0000 mL | Freq: Once | OROMUCOSAL | Status: AC
  Administered 2021-10-07: 15 mL via OROMUCOSAL
  Filled 2021-10-07: qty 15

## 2021-10-07 MED ORDER — ONDANSETRON HCL 4 MG/2ML IJ SOLN: 4.0000 mg | Freq: Four times a day (QID) | INTRAMUSCULAR | Status: DC | PRN

## 2021-10-07 MED ORDER — OXYCODONE HCL 5 MG PO TABS: 5.0000 mg | ORAL_TABLET | Freq: Once | ORAL | Status: DC | PRN

## 2021-10-07 NOTE — Anesthesia Preprocedure Evaluation (Signed)
Anesthesia Evaluation  Patient identified by MRN, date of birth, ID band Patient awake    Reviewed: Allergy & Precautions, H&P , NPO status , Patient's Chart, lab work & pertinent test results  History of Anesthesia Complications (+) PONV and history of anesthetic complications  Airway Mallampati: II   Neck ROM: full    Dental   Pulmonary former smoker,    breath sounds clear to auscultation       Cardiovascular hypertension, +CHF   Rhythm:regular Rate:Normal     Neuro/Psych  Headaches, PSYCHIATRIC DISORDERS Anxiety Depression Bipolar Disorder  Neuromuscular disease    GI/Hepatic GERD  ,  Endo/Other    Renal/GU      Musculoskeletal  (+) Arthritis , Fibromyalgia -  Abdominal   Peds  Hematology  (+) Blood dyscrasia, anemia ,   Anesthesia Other Findings   Reproductive/Obstetrics                             Anesthesia Physical Anesthesia Plan  ASA: 3  Anesthesia Plan: General   Post-op Pain Management:    Induction: Intravenous  PONV Risk Score and Plan: 4 or greater and Ondansetron, Dexamethasone, Midazolam, Scopolamine patch - Pre-op and Treatment may vary due to age or medical condition  Airway Management Planned: Oral ETT  Additional Equipment:   Intra-op Plan:   Post-operative Plan: Extubation in OR  Informed Consent: I have reviewed the patients History and Physical, chart, labs and discussed the procedure including the risks, benefits and alternatives for the proposed anesthesia with the patient or authorized representative who has indicated his/her understanding and acceptance.     Dental advisory given  Plan Discussed with: CRNA, Anesthesiologist and Surgeon  Anesthesia Plan Comments:         Anesthesia Quick Evaluation

## 2021-10-07 NOTE — Anesthesia Procedure Notes (Signed)
Procedure Name: Intubation Date/Time: 10/07/2021 8:40 AM Performed by: Reece Agar, CRNA Pre-anesthesia Checklist: Patient identified, Emergency Drugs available, Suction available and Patient being monitored Patient Re-evaluated:Patient Re-evaluated prior to induction Oxygen Delivery Method: Circle System Utilized Preoxygenation: Pre-oxygenation with 100% oxygen Induction Type: IV induction Ventilation: Mask ventilation without difficulty Laryngoscope Size: Glidescope and 3 Grade View: Grade I Tube type: Oral Tube size: 7.0 mm Number of attempts: 1 Airway Equipment and Method: Rigid stylet and Video-laryngoscopy Placement Confirmation: ETT inserted through vocal cords under direct vision, positive ETCO2 and breath sounds checked- equal and bilateral Secured at: 21 cm Tube secured with: Tape Dental Injury: Teeth and Oropharynx as per pre-operative assessment

## 2021-10-07 NOTE — Transfer of Care (Signed)
Immediate Anesthesia Transfer of Care Note  Patient: [name]  Procedure(s) Performed: MRI BRAIN WITH AND WITHOUT CONTRAST; MRI TOTAL SPINE;  METS SCREENING WITH ANESTHESIA  Patient Location: PACU  Anesthesia Type:General  Level of Consciousness: awake, alert , oriented, patient cooperative and responds to stimulation  Airway & Oxygen Therapy: Patient Spontanous Breathing  Post-op Assessment: Report given to RN and Post -op Vital signs reviewed and stable  Post vital signs: Reviewed and stable  Last Vitals:  Vitals Value Taken Time  BP 125/74 10/07/21 1101  Temp 36.6 C 10/07/21 1100  Pulse 84 10/07/21 1102  Resp 15 10/07/21 1102  SpO2 97 % 10/07/21 1102  Vitals shown include unvalidated device data.  Last Pain:  Vitals:   10/07/21 0702  TempSrc:   PainSc: 0-No pain      Patients Stated Pain Goal: 1 (56/38/93 7342)  Complications: No notable events documented.

## 2021-10-08 ENCOUNTER — Telehealth: Payer: Self-pay

## 2021-10-08 ENCOUNTER — Encounter (HOSPITAL_COMMUNITY): Payer: Self-pay | Admitting: Radiology

## 2021-10-08 NOTE — Telephone Encounter (Signed)
Pt called and LVM asking for MRI results. Pt did not answer. LVM for pt to return call for results.

## 2021-10-08 NOTE — Telephone Encounter (Signed)
Pt called regarding recent MRI results. Pt given results; verbalized thanks and understanding.

## 2021-10-11 NOTE — Anesthesia Postprocedure Evaluation (Signed)
Anesthesia Post Note  Patient: [name]  Procedure(s) Performed: MRI BRAIN WITH AND WITHOUT CONTRAST; MRI TOTAL SPINE;  METS SCREENING WITH ANESTHESIA     Patient location during evaluation: PACU Anesthesia Type: General Level of consciousness: awake and alert Pain management: pain level controlled Vital Signs Assessment: post-procedure vital signs reviewed and stable Respiratory status: spontaneous breathing, nonlabored ventilation, respiratory function stable and patient connected to nasal cannula oxygen Cardiovascular status: blood pressure returned to baseline and stable Postop Assessment: no apparent nausea or vomiting Anesthetic complications: no   No notable events documented.  Last Vitals:  Vitals:   10/07/21 1115 10/07/21 1130  BP: 128/86 111/85  Pulse: 92 87  Resp: 20 14  Temp:  (!) 36.4 C  SpO2: 95% 99%    Last Pain:  Vitals:   10/07/21 1130  TempSrc:   PainSc: 0-No pain                 Nil Bolser S

## 2021-10-14 ENCOUNTER — Other Ambulatory Visit: Payer: Self-pay

## 2021-10-14 ENCOUNTER — Inpatient Hospital Stay (HOSPITAL_BASED_OUTPATIENT_CLINIC_OR_DEPARTMENT_OTHER): Payer: Medicaid Other | Admitting: Internal Medicine

## 2021-10-14 DIAGNOSIS — C7931 Secondary malignant neoplasm of brain: Secondary | ICD-10-CM | POA: Diagnosis not present

## 2021-10-14 NOTE — Progress Notes (Signed)
I connected with [name] on 10/14/21 at 11:00 AM EST by telephone visit and verified that I am speaking with the correct person using two identifiers.  I discussed the limitations, risks, security and privacy concerns of performing an evaluation and management service by telemedicine and the availability of in-person appointments. I also discussed with the patient that there may be a patient responsible charge related to this service. The patient expressed understanding and agreed to proceed.  Other persons participating in the visit and their role in the encounter:  n/a  Patient's location:  Home  Provider's location:  Office  Chief Complaint:  Brain Metastases  History of Present Ilness: [name] describes no new or progressive neurologic deficits.  No reports of further headaches.  Denies seizures.  Continues to follow with Dr. [name].  Observations: Cognition and language normal  Imaging:  [facility] Clinician Interpretation: I have personally reviewed the CNS images as listed.  My interpretation, in the context of the patient's clinical presentation, is reduced stable disease  MR Brain W Wo Contrast  Result Date: 10/07/2021 CLINICAL DATA:  Secondary malignant neoplasm of cervical lymph node. Primary cancer of lower inner quadrant of left female breast. Brain metastases. Breast cancer, assess treatment response; brain metastases. EXAM: MRI HEAD WITHOUT AND WITH CONTRAST TECHNIQUE: Multiplanar, multiecho pulse sequences of the brain and surrounding structures were obtained without and with intravenous contrast. CONTRAST:  46mL GADAVIST GADOBUTROL 1 MMOL/ML IV SOLN COMPARISON:  Prior brain MRI examinations 01/02/2021 and earlier. FINDINGS: Brain: Generalized cerebral and cerebellar atrophy, stable. Redemonstrated advanced, confluent T2 FLAIR hyperintense signal abnormality within the bilateral cerebral white matter, likely reflecting treatment-related changes. As before, there are a few  scattered chronic microhemorrhages within the supratentorial and infratentorial brain. Persistent although decreased smooth dural thickening and enhancement overlying the right parietal lobe, likely related to prior subdural hematoma at this site. Redemonstrated sequela of prior left occipital craniotomy for metastasis resection. Stable T2 FLAIR hyperintense signal abnormality surrounding the resection site. No abnormal enhancement at this site to suggest recurrent tumor. No abnormal intracranial enhancement elsewhere to suggest a new intracranial metastasis. No acute infarct. No midline shift. Vascular: Maintained flow voids within the proximal large arterial vessels. Skull and upper cervical spine: Prior left occipital craniotomy. No focal suspicious marrow lesion. Sinuses/Orbits: Visualized orbits show no acute finding. Bilateral lens replacements. Tiny left maxillary sinus mucous retention cyst. Other: Trace fluid within the right mastoid air cells. IMPRESSION: Stable postoperative changes from prior left cerebellar metastasis resection. No evidence of recurrent tumor at this site. No new intracranial metastases. Persistent although decreased smooth dural thickening and enhancement overlying the right parietal lobe, likely related to prior subdural hematoma at this site. Electronically Signed   By: Kellie Simmering D.O.   On: 10/07/2021 11:29   MR TOTAL SPINE METS SCREENING  Result Date: 10/07/2021 CLINICAL DATA:  Bone metastases. Brain metastases. Bilateral malignant neoplasm involving both nipple and areola in female, unspecified estrogen receptor status. EXAM: MRI TOTAL SPINE WITHOUT AND WITH CONTRAST TECHNIQUE: Multisequence MR imaging of the spine from the cervical spine to the sacrum was performed prior to and following IV contrast administration for evaluation of spinal metastatic disease. CONTRAST:  69mL GADAVIST GADOBUTROL 1 MMOL/ML IV SOLN COMPARISON:  PET-CT 08/19/2021. MRI of the total spine  01/02/2021. FINDINGS: MRI CERVICAL SPINE FINDINGS Alignment: No significant spondylolisthesis. Vertebrae: Redemonstrated small Schmorl node within the C6 inferior endplate. Vertebral body height is otherwise maintained. No focal suspicious osseous signal abnormality or enhancement. Cord: No  appreciable cervical spinal cord signal abnormality or abnormal cord enhancement. Posterior Fossa, vertebral arteries, paraspinal tissues: Posterior fossa better assessed on same-day brain MRI. Limited assessment of the cervical vertebral artery flow voids on sagittal imaging. No paraspinal soft tissue mass. Disc levels: Cervical spondylosis has not appreciably changed, with findings most notably as follows. At C5-C6, there is mild-to-moderate disc degeneration. Disc bulge with endplate spurring and bilateral uncovertebral hypertrophy. Facet arthrosis/ligamentum flavum hypertrophy. No significant spinal canal stenosis. At C6-C7, there is mild-to-moderate disc degeneration. Small disc bulge. Facet arthrosis/ligamentum flavum hypertrophy. No significant spinal canal stenosis. Please note, there is limited assessment of the neural foramina in the absence of axial imaging. MRI THORACIC SPINE FINDINGS Alignment:  No significant spondylolisthesis. Vertebrae: Vertebral body height is maintained. Grossly unchanged T1 hypointense and T2 STIR hyperintense lesion within the T9 vertebral body with minimal peripheral enhancement. No focal suspicious osseous signal abnormality or enhancement elsewhere within the thoracic spine. Cord: No appreciable signal abnormality or abnormal enhancement of the spinal cord at the thoracic levels. Paraspinal and other soft tissues: No paraspinal soft tissue mass. Disc levels: Mild thoracic spondylosis has not appreciably changed. No more than mild disc degeneration within the thoracic spine. Shallow multilevel disc bulges. No significant spinal canal stenosis. No appreciable significant foraminal stenosis.  MRI LUMBAR SPINE FINDINGS Segmentation: 5 lumbar vertebrae. The caudal most well-formed intervertebral disc space is designated L5-S1. Alignment:  2 mm L3-L4 grade 1 retrolisthesis. Vertebrae: Vertebral body height is maintained. No focal suspicious osseous signal abnormality or enhancement. As before, there is mild degenerative appearing edema and enhancement within the posterior elements at L4 and L5. Conus medullaris: Extends to the L1-L2 level and appears normal. Paraspinal and other soft tissues: No paraspinal soft tissue mass. Disc levels: Lumbar spondylosis has not appreciably changed. Mild multilevel disc degeneration, greatest at L4-L5. As before, there are disc bulges at L3-L4, L4-L5 and L5-S1. Suspected superimposed small central disc protrusion at L4-L5. Multilevel facet arthrosis/ligamentum flavum hypertrophy, most prominent at L4-L5. No more than mild relative spinal canal narrowing. No appreciable high-grade foraminal stenosis on sagittal imaging. Redemonstrated small bilateral L4-L5 facet joint effusions. IMPRESSION: 1. Known bony metastasis within the T9 vertebral body with minimal peripheral enhancement, unchanged from the prior total spine MRI of 01/02/2021. No MR evidence of new osseous metastasis to the cervical, thoracic or lumbar spine. Additional sclerotic lesions seen on prior CT examinations are not appreciable by MR modality. 2. No pathologic fracture or epidural tumor involvement. 3. Cervical, thoracic and lumbar spondylosis, as outlined and unchanged. No more than mild appreciable spinal canal narrowing. Electronically Signed   By: Kellie Simmering D.O.   On: 10/07/2021 12:39     Assessment and Plan: Clinically and radiographically stable.  Both sets of imaging were reviewed today, Brain and Spine.  No new or progressive changes.  We ask that [name] return to clinic in 1 year following next brain MRI, or sooner as needed.  I discussed the assessment and treatment plan with the  patient.  The patient was provided an opportunity to ask questions and all were answered.  The patient agreed with the plan and demonstrated understanding of the instructions.    The patient was advised to call back or seek an in-person evaluation if the symptoms worsen or if the condition fails to improve as anticipated.  Ventura Sellers, MD   I provided 15 minutes of non face-to-face telephone visit time during this encounter, and > 50% was spent counseling as documented under  my assessment & plan.

## 2021-10-16 ENCOUNTER — Other Ambulatory Visit: Payer: Self-pay | Admitting: Family Medicine

## 2021-10-16 ENCOUNTER — Other Ambulatory Visit: Payer: Self-pay | Admitting: Oncology

## 2021-10-16 DIAGNOSIS — J302 Other seasonal allergic rhinitis: Secondary | ICD-10-CM

## 2021-10-16 NOTE — Telephone Encounter (Signed)
Requested Prescriptions  Pending Prescriptions Disp Refills  . fluticasone (FLONASE) 50 MCG/ACT nasal spray [Pharmacy Med Name: FLUTICASONE PROP 50 MCG SPRAY] 16 mL 2    Sig: SPRAY 2 SPRAYS INTO EACH NOSTRIL EVERY DAY     Ear, Nose, and Throat: Nasal Preparations - Corticosteroids Passed - 10/16/2021  7:21 AM      Passed - Valid encounter within last 12 months    Recent Outpatient Visits          3 months ago Other constipation   [place], [name], MD   7 months ago Annual physical exam   [place], [name], MD   1 year ago Acute non-recurrent sinusitis of other sinus   [place], Enobong, MD   1 year ago Other constipation   [facility] Charlott Rakes, MD   2 years ago Metastatic breast cancer [facility])   [facility] Charlott Rakes, MD

## 2021-10-18 ENCOUNTER — Emergency Department (HOSPITAL_COMMUNITY)
Admission: EM | Admit: 2021-10-18 | Discharge: 2021-10-18 | Discharge status: Against Medical Advice | Payer: Medicaid Other

## 2021-10-18 NOTE — ED Notes (Signed)
No answer for triage.

## 2021-10-21 ENCOUNTER — Telehealth: Payer: Self-pay | Admitting: Internal Medicine

## 2021-10-21 NOTE — Telephone Encounter (Signed)
Scheduled per 11/15 los, pt has been called and confirmed

## 2021-10-22 ENCOUNTER — Inpatient Hospital Stay: Payer: Medicaid Other

## 2021-10-22 ENCOUNTER — Inpatient Hospital Stay: Payer: Medicaid Other | Admitting: *Deleted

## 2021-10-22 ENCOUNTER — Inpatient Hospital Stay: Payer: Medicaid Other | Admitting: Oncology

## 2021-10-22 NOTE — Progress Notes (Incomplete)
[name]  [phone] [phone]     ID: [name] DOB: 04-05-1961  MR#: [id number]  UMP#:[id number]  Patient Care Team: Shannon Rakes, MD as PCP - General (Family Medicine) Shannon Dresser, MD as PCP - Advanced Heart Failure (Cardiology) Shannon Rudd, MD as Consulting Physician (Radiation Oncology) Shannon Levine, MD as Consulting Physician (Neurosurgery) Shannon Pilgrim, MD as Consulting Physician (Psychiatry) Shannon Skinner Acey Lav, MD as Consulting Physician (Psychiatry) Shannon Settle, MD as Referring Physician (Plastic Surgery) Shannon Limbo, MD as Consulting Physician (Plastic Surgery) [name], Shannon Lofty, DO as Attending Physician (Plastic Surgery) [name], Shannon Dad, MD as Consulting Physician (Oncology) [name], [name] (Dentistry)   CHIEF COMPLAINT: Metastatic triple positive breast cancer  CURRENT TREATMENT: trastuzumab [name] 21 days], [denosumab/Xgeva (Q6W)], anastrozole   INTERVAL HISTORY: [name] returns today for follow up and treatment of her metastatic triple positive breast cancer.   Since her last visit, she underwent brain and total spine MRI under anesthesia on 10/07/2021. Brain MRI showed: stable postoperative changes, no evidence of recurrent tumor; no new intracranial metastases; persistent although decreased smooth dural thickening and enhancement overlying right parietal lobe, likely related to prior subdural hematoma.  Total spine MRI showed: known bony metastasis in T9 unchanged from prior; no evidence of new osseous metastasis; no pathologic fracture or epidural tumor.  She continues on trastuzumab, given every 21 days.  We switched back to this for maintenance after she took TDM-1 for 8 months.  Her most recent echo on 08/12/2021 showed an ejection fraction in the 55-60% range  She continues on anastrozole with good tolerance except for vaginal dryness issues.  She has already participated in the pelvic  rehab program  She also continues on denosumab/Xgeva every 6 weeks.[name]  However this has been held due to a healing dental extraction.  Her last dose was 01/23/2021.  But we are not planning to renew it until after she has had clearance from oral surgery for her osteonecrosis problem.[name]   REVIEW OF SYSTEMS: [name]    COVID 19 VACCINATION STATUS: Status post [place] x2 with booster September 2021   BREAST CANCER HISTORY: From the original intake note:  [name] was aware of a "lemon sized lump in" her left axilla for about a year before bringing it to medical attention. By then she had developed left breast and left axillary swelling (June 2016). She presented to the local emergency room and had a chest CT scan 06/06/2015 which showed a nodule in the left breast measuring 0.9 cm and questionable left axillary adenopathy. She then proceeded to bilateral diagnostic mammography and left breast ultrasonography 06/19/2015. There were no prior films for comparison (last mammography 12 years prior).. The breast density was category C. Mammography showed in the left breast upper inner quadrant a 7 cm area including a small mass and significant pleomorphic calcifications. Ultrasonography defined the mass as measuring 1.2 cm. The left axilla appeared unremarkable. There was significant skin edema.  Biopsy of the left breast mass 06/19/2015 showed (SP [id number]) an invasive ductal carcinoma, grade 2, estrogen receptor 83% positive, progesterone receptor 26% positive, and HER-2 amplified by immunohistochemstry with a 3+ reading. The patient had biopsies of a separate area in the left breast August of the same year and this showed atypical ductal hyperplasia. (SP [id number]).  Accordingly after appropriate discussion on 08/21/2015 the patient proceeded to left mastectomy with left axillary sentinel lymph node sampling, which, since the lymph nodes were positive, extended to the procedure to left axillary  lymph node  dissection. The pathology (SP [id number]) showed an invasive ductal carcinoma, grade 3, measuring in excess of 9 cm. There were also skin satellites, not contiguous with the invasive carcinoma. Margins were clear and ample. There was evidence of lymphovascular invasion. A total of 15 lymph nodes were removed, including 5 sentinel lymph nodes, all of which were positive, so that the final total was 14 out of 15 lymph nodes involved by tumor. There was evidence of extranodal extension. The final pathology was pT4b pN2a, stage IIIB  CA-27-29 and CEA 09/19/2015 were non-informative October 2016.  Unfortunately CT scans of the chest abdomen and pelvis 09/16/2015 showed bony metastases to the ri/ght scapula, left iliac crest, and also L4 and T-spine. There were questionable liver cysts which on repeat CT scan 03/02/2016 appear to be a little bit more well-defined, possibly a little larger. There were also some possible right upper lobe lung lesions.  Adjuvant treatment consisted of docetaxel, trastuzumab and pertuzumab, with the final (6th) docetaxel dose given 02/11/2016. She continues on trastuzumab and pertuzumab, with the 11th cycle given 05/05/2016. Echocardiogram 02/26/2016 showed an ejection fraction of 55%. She receives denosumab/Xgeva every 4 weeks.. She also receives radiation, started 06/09/201, to be completed 06/26/2016.  Her subsequent history is as detailed below   PAST MEDICAL HISTORY: Past Medical History:  Diagnosis Date   Alcohol abuse    none since 2013   Anemia    during chemo   Anxiety    At age 60   Arthritis Dx 2010   Bipolar disorder ([place])    Breast cancer ([place])    Bronchitis    Cancer ([place])    breast mets to brain   CHF (congestive heart failure) ([place])    Chronic pain    resolved per patient 6/71/24   Complication of anesthesia    Depression    Family history of adverse reaction to anesthesia    MOther had PONV   GERD (gastroesophageal reflux disease)    Headache     hx  migraines   HLD (hyperlipidemia)    Hypertension    Lymphedema of left arm    Opiate dependence (HCC)    PONV (postoperative nausea and vomiting)    Port-A-Cath in place    PTSD (post-traumatic stress disorder)    S/P endometrial ablation    in md's office    PAST SURGICAL HISTORY: Past Surgical History:  Procedure Laterality Date   APPLICATION OF CRANIAL NAVIGATION N/A 08/14/2016   Procedure: APPLICATION OF CRANIAL NAVIGATION;  Surgeon: Shannon Levine, MD;  Location: [place] [facility];  Service: Neurosurgery;  Laterality: N/A;   BREAST RECONSTRUCTION Left    with silicone implant   COLONOSCOPY W/ POLYPECTOMY     CRANIOTOMY N/A 08/14/2016   Procedure: CRANIOTOMY TUMOR EXCISION WITH Lucky Rathke;  Surgeon: Shannon Levine, MD;  Location: [place] [facility];  Service: Neurosurgery;  Laterality: N/A;   FIBULA FRACTURE SURGERY Left    MASTECTOMY Left    RADIOLOGY WITH ANESTHESIA N/A 07/23/2016   Procedure: MRI OF BRAIN WITH AND WITHOUT;  Surgeon: Medication Radiologist, MD;  Location: [facility];  Service: Radiology;  Laterality: N/A;   RADIOLOGY WITH ANESTHESIA N/A 09/08/2016   Procedure: MRI OF BRAIN WITH AND WITHOUT CONTRAST;  Surgeon: Medication Radiologist, MD;  Location: [place] [facility];  Service: Radiology;  Laterality: N/A;   RADIOLOGY WITH ANESTHESIA N/A 12/10/2016   Procedure: MRI OF BRAIN WITH AND WITHOUT;  Surgeon: Medication Radiologist, MD;  Location: [place];  Service: Radiology;  Laterality:  N/A;   RADIOLOGY WITH ANESTHESIA N/A 03/02/2017   Procedure: MRI of BRAIN W and W/OUT CONTRAST;  Surgeon: Medication Radiologist, MD;  Location: [facility];  Service: Radiology;  Laterality: N/A;   RADIOLOGY WITH ANESTHESIA N/A 07/29/2017   Procedure: RADIOLOGY WITH ANESTHESIA MRI OF BRAIN WITH AND WITHOUT CONTRAST;  Surgeon: Radiologist, Medication, MD;  Location: [place];  Service: Radiology;  Laterality: N/A;   RADIOLOGY WITH ANESTHESIA N/A 12/07/2017   Procedure: MRI WITH ANESTHESIA OF BRAIN WITH AND WITHOUT CONTRAST;   Surgeon: Radiologist, Medication, MD;  Location: [place];  Service: Radiology;  Laterality: N/A;   RADIOLOGY WITH ANESTHESIA N/A 04/07/2018   Procedure: MRI OF BRAIN WITH AND WITHOUT CONTRAST;  Surgeon: Radiologist, Medication, MD;  Location: [place] [facility] [place];  Service: Radiology;  Laterality: N/A;   RADIOLOGY WITH ANESTHESIA N/A 08/23/2018   Procedure: MRI WITH ANESTHESIA OF THE BRAIN WITH AND WITHOUT;  Surgeon: Radiologist, Medication, MD;  Location: [facility] [place];  Service: Radiology;  Laterality: N/A;   RADIOLOGY WITH ANESTHESIA N/A 01/24/2019   Procedure: MRI OF BRAIN WITH AND WITHOUT CONTRAST;  Surgeon: Radiologist, Medication, MD;  Location: [place];  Service: Radiology;  Laterality: N/A;   RADIOLOGY WITH ANESTHESIA N/A 07/06/2019   Procedure: MRI WITH ANESTHESIA OF BRAIN WITH AND WITHOUT CONTRAST;  Surgeon: Radiologist, Medication, MD;  Location: [place];  Service: Radiology;  Laterality: N/A;   RADIOLOGY WITH ANESTHESIA N/A 01/11/2020   Procedure: MRI WITH ANESTHESIA BRAIN WITH AND WITHOUT;  Surgeon: Radiologist, Medication, MD;  Location: [place];  Service: Radiology;  Laterality: N/A;   RADIOLOGY WITH ANESTHESIA N/A 05/21/2020   Procedure: MRI WITH ANESTHESIA OF BRAIN WITH AND WITHOUT CONTRAST;  Surgeon: Radiologist, Medication, MD;  Location: [place];  Service: Radiology;  Laterality: N/A;   RADIOLOGY WITH ANESTHESIA N/A 01/02/2021   Procedure: MRI WITH ANESTHESIA  BRAIN WITH AND WITHOUT CONTRAST,TOTAL SPINE MET SCREENING;  Surgeon: Radiologist, Medication, MD;  Location: [place];  Service: Radiology;  Laterality: N/A;   RADIOLOGY WITH ANESTHESIA N/A 10/07/2021   Procedure: MRI BRAIN WITH AND WITHOUT CONTRAST; MRI TOTAL SPINE;  METS SCREENING WITH ANESTHESIA;  Surgeon: Radiologist, Medication, MD;  Location: [place];  Service: Radiology;  Laterality: N/A;   right power port placement Right     FAMILY HISTORY Family History  Problem Relation Age of Onset   Diabetes Mother    Bipolar disorder Mother    CAD Father    Breast  cancer Neg Hx   The patient's father still living, age 31, in [place]. He had prostate cancer at some point in the past. The patient's mother died at age 42 from complications of diabetes. The patient had no brothers, 2 sisters. A paternal grandmother had lung cancer in the setting of tobacco abuse. There is no other history of cancer in the family to her knowledge   GYNECOLOGIC HISTORY:  No LMP recorded. Patient has had an ablation. Menarche approximately age 46. First live birth in 45. The patient is GX P2. She underwent endometrial ablation in 2016.   SOCIAL HISTORY: (Updated April 2022) The patient is originally from [place]. She moved to this area from [place] because it was less expensive here she says.   She now has has her own 1 bedroom apartment on [address].  She is divorced. Her 2 children are [name] who lives in [place] and works as a Development worker, community, and Bushland who lives in [place] and works as Freight forwarder of a healthcare facility.  [name] has 2 children, Interior and spatial designer and Grayce Sessions.  He married in 2022, his wife Minette Brine, has 2 children of her own plus a step grandson).  [name] has no children.     ADVANCED DIRECTIVES: Not in place; at the 06/03/2016 visit the patient was given the appropriate forms to complete and notarize at her discretion   HEALTH MAINTENANCE: Social History   Tobacco Use   Smoking status: Former    Packs/day: 1.00    Years: 43.00    Pack years: 43.00    Types: Cigarettes   Smokeless tobacco: Never   Tobacco comments:    3 day since last cigarette  Vaping Use   Vaping Use: Some days  Substance Use Topics   Alcohol use: No    Comment: no ETOH since 08/22/12   Drug use: No    Comment: states she's in recovery program for 7 years     Colonoscopy:  PAP:  Bone density:   Allergies  Allergen Reactions   Demerol [Meperidine Hcl] Itching and Nausea And Vomiting    INTOLERANCE >  N & V   Erythromycin Rash    Current Outpatient Medications  on File Prior to Visit  Medication Sig Dispense Refill   cyclobenzaprine (FLEXERIL) 10 MG tablet TAKE 1 TABLET BY MOUTH TWICE A DAY (Patient not taking: Reported on 10/01/2021) 60 tablet 1   acetaminophen (TYLENOL) 500 MG tablet Take 1 tablet (500 mg total) by mouth 2 (two) times daily as needed. Take with naproxen 250 mg tablet (Patient taking differently: Take 500 mg by mouth 2 (two) times daily. Take with naproxen 250 mg tablet) 180 tablet 4   AMITIZA 8 MCG capsule TAKE 1 CAPSULE (8 MCG TOTAL) BY MOUTH 2 (TWO) TIMES DAILY WITH A MEAL. 60 capsule 1   anastrozole (ARIMIDEX) 1 MG tablet Take 1 tablet (1 mg total) by mouth daily. 90 tablet 4   azelastine (ASTELIN) 0.1 % nasal spray Place 2 sprays into both nostrils daily. Use in each nostril as directed (Patient taking differently: Place 1 spray into both nostrils daily. Use in each nostril as directed) 30 mL 12   carvedilol (COREG) 3.125 MG tablet TAKE 1 TABLET (3.125 MG TOTAL) BY MOUTH 2 TIMES DAILY FOR 10 DAYS, THEN 2 TABLETS 2 TIMES DAILY. (Patient not taking: Reported on 10/01/2021) 120 tablet 3   cholecalciferol (VITAMIN D3) 25 MCG (1000 UNIT) tablet Take 2 tablets (2,000 Units total) by mouth daily. (Patient taking differently: Take 5,000 Units by mouth daily.) 90 tablet 4   cromolyn (OPTICROM) 4 % ophthalmic solution Place 1 drop into both eyes daily.     docusate sodium (COLACE) 100 MG capsule TAKE 1 CAPSULE BY MOUTH ONCE A DAY 100 capsule 6   fluticasone (FLONASE) 50 MCG/ACT nasal spray SPRAY 2 SPRAYS INTO EACH NOSTRIL EVERY DAY 16 mL 2   gabapentin (NEURONTIN) 300 MG capsule Take 300-600 mg by mouth See admin instructions. Take 300 mg in the morning and 600 mg at bedtime     ibuprofen (ADVIL) 800 MG tablet TAKE 1 TABLET BY MOUTH 2 (TWO) TIMES DAILY AS NEEDED FOR MODERATE PAIN. TAKE WITH FOOD. (Patient not taking: Reported on 10/01/2021) 60 tablet 6   lamoTRIgine (LAMICTAL) 100 MG tablet Take 100 mg by mouth every morning.     loratadine  (CLARITIN) 10 MG tablet TAKE 1 TABLET BY MOUTH EVERY DAY 90 tablet 3   Lurasidone HCl 60 MG TABS Take 60 mg by mouth daily.     naproxen (NAPROSYN) 250 MG tablet Take 1 tablet (250 mg  total) by mouth 2 (two) times daily as needed. Take together with tylenol 500 mg tablet (Patient taking differently: Take 250 mg by mouth 2 (two) times daily. Take together with tylenol 500 mg tablet) 180 tablet 4   ondansetron (ZOFRAN) 8 MG tablet TAKE 1 TABLET BY MOUTH EVERY 8 HOURS AS NEEDED FOR NAUSEA AND VOMITING 90 tablet 1   pantoprazole (PROTONIX) 40 MG tablet TAKE 1 TABLET BY MOUTH EVERY DAY 90 tablet 3   polyethylene glycol (MIRALAX / GLYCOLAX) 17 g packet Take 17 g by mouth daily as needed for mild constipation. (Patient taking differently: Take 17 g by mouth every 3 (three) days.) 30 each 6   rosuvastatin (CRESTOR) 10 MG tablet TAKE 1 TABLET BY MOUTH EVERY DAY 90 tablet 3   traZODone (DESYREL) 100 MG tablet Take 1 tablet (100 mg total) by mouth at bedtime. (Patient taking differently: Take 200-300 mg by mouth at bedtime.) 30 tablet 0   vitamin C (ASCORBIC ACID) 500 MG tablet Take 500 mg by mouth daily.     No current facility-administered medications on file prior to visit.    OBJECTIVE: White woman in no acute distress There were no vitals filed for this visit. There is no height or weight on file to calculate BMI.  ECOG FS: 2   Sclerae unicteric, EOMs intact Wearing a mask No cervical or supraclavicular adenopathy Lungs no rales or rhonchi Heart regular rate and rhythm Abd soft, nontender, positive bowel sounds MSK no focal spinal tenderness, no upper extremity lymphedema Neuro: nonfocal, well oriented, appropriate affect Breasts:    {Sclerae unicteric, EOMs intact Wearing a mask No cervical or supraclavicular adenopathy Lungs no rales or rhonchi Heart regular rate and rhythm Abd soft, nontender, positive bowel sounds MSK no focal spinal tenderness, no upper extremity lymphedema Neuro:  nonfocal, well oriented, appropriate affect Breasts: The right breast is unremarkable.  The left breast is status post mastectomy with saline reconstruction.  There is no evidence of local recurrence.  Both axillae are benign.}  LAB RESULTS: No results found for: LABCA2  CBC    Component Value Date/Time   WBC 4.6 10/03/2021 1041   WBC 3.8 (L) 03/27/2021 0941   RBC 3.70 (L) 10/03/2021 1041   HGB 10.8 (L) 10/03/2021 1041   HGB 12.1 10/26/2017 1038   HCT 33.5 (L) 10/03/2021 1041   HCT 35.7 10/26/2017 1038   PLT 131 (L) 10/03/2021 1041   PLT 167 10/26/2017 1038   MCV 90.5 10/03/2021 1041   MCV 98.4 10/26/2017 1038   MCH 29.2 10/03/2021 1041   MCHC 32.2 10/03/2021 1041   RDW 13.2 10/03/2021 1041   RDW 13.3 10/26/2017 1038   LYMPHSABS 1.2 10/03/2021 1041   LYMPHSABS 0.5 (L) 10/26/2017 1038   MONOABS 0.3 10/03/2021 1041   MONOABS 0.1 10/26/2017 1038   EOSABS 0.0 10/03/2021 1041   EOSABS 0.0 10/26/2017 1038   BASOSABS 0.0 10/03/2021 1041   BASOSABS 0.0 10/26/2017 1038   CMP Latest Ref Rng & Units 10/03/2021 09/11/2021 08/21/2021  Glucose 70 - 99 mg/dL 97 103(H) 106(H)  BUN 6 - 20 mg/dL _0 Creatinine 0.44 - 1.00 mg/dL 0.98 1.05(H) 1.17(H)  Sodium 135 - 145 mmol/L 141 137 140  Potassium 3.5 - 5.1 mmol/L 3.9 3.5 4.0  Chloride 98 - 111 mmol/L 103 100 100  CO2 22 - 32 mmol/L _1 Calcium 8.9 - 10.3 mg/dL 9.2 9.3 10.5(H)  Total Protein 6.5 - 8.1 g/dL 6.8 7.4 7.2  Total Bilirubin 0.3 - 1.2 mg/dL 0.3 0.4 0.4  Alkaline Phos 38 - 126 U/L 75 75 81  AST 15 - 41 U/L _0 ALT 0 - 44 U/L _1 STUDIES: MR Brain W Wo Contrast  Result Date: 10/07/2021 CLINICAL DATA:  Secondary malignant neoplasm of cervical lymph node. Primary cancer of lower inner quadrant of left female breast. Brain metastases. Breast cancer, assess treatment response; brain metastases. EXAM: MRI HEAD WITHOUT AND WITH CONTRAST TECHNIQUE: Multiplanar, multiecho pulse sequences of the brain and  surrounding structures were obtained without and with intravenous contrast. CONTRAST:  75m GADAVIST GADOBUTROL 1 MMOL/ML IV SOLN COMPARISON:  Prior brain MRI examinations 01/02/2021 and earlier. FINDINGS: Brain: Generalized cerebral and cerebellar atrophy, stable. Redemonstrated advanced, confluent T2 FLAIR hyperintense signal abnormality within the bilateral cerebral white matter, likely reflecting treatment-related changes. As before, there are a few scattered chronic microhemorrhages within the supratentorial and infratentorial brain. Persistent although decreased smooth dural thickening and enhancement overlying the right parietal lobe, likely related to prior subdural hematoma at this site. Redemonstrated sequela of prior left occipital craniotomy for metastasis resection. Stable T2 FLAIR hyperintense signal abnormality surrounding the resection site. No abnormal enhancement at this site to suggest recurrent tumor. No abnormal intracranial enhancement elsewhere to suggest a new intracranial metastasis. No acute infarct. No midline shift. Vascular: Maintained flow voids within the proximal large arterial vessels. Skull and upper cervical spine: Prior left occipital craniotomy. No focal suspicious marrow lesion. Sinuses/Orbits: Visualized orbits show no acute finding. Bilateral lens replacements. Tiny left maxillary sinus mucous retention cyst. Other: Trace fluid within the right mastoid air cells. IMPRESSION: Stable postoperative changes from prior left cerebellar metastasis resection. No evidence of recurrent tumor at this site. No new intracranial metastases. Persistent although decreased smooth dural thickening and enhancement overlying the right parietal lobe, likely related to prior subdural hematoma at this site. Electronically Signed   By: KKellie SimmeringD.O.   On: 10/07/2021 11:29   MR TOTAL SPINE METS SCREENING  Result Date: 10/07/2021 CLINICAL DATA:  Bone metastases. Brain metastases. Bilateral  malignant neoplasm involving both nipple and areola in female, unspecified estrogen receptor status. EXAM: MRI TOTAL SPINE WITHOUT AND WITH CONTRAST TECHNIQUE: Multisequence MR imaging of the spine from the cervical spine to the sacrum was performed prior to and following IV contrast administration for evaluation of spinal metastatic disease. CONTRAST:  751mGADAVIST GADOBUTROL 1 MMOL/ML IV SOLN COMPARISON:  PET-CT 08/19/2021. MRI of the total spine 01/02/2021. FINDINGS: MRI CERVICAL SPINE FINDINGS Alignment: No significant spondylolisthesis. Vertebrae: Redemonstrated small Schmorl node within the C6 inferior endplate. Vertebral body height is otherwise maintained. No focal suspicious osseous signal abnormality or enhancement. Cord: No appreciable cervical spinal cord signal abnormality or abnormal cord enhancement. Posterior Fossa, vertebral arteries, paraspinal tissues: Posterior fossa better assessed on same-day brain MRI. Limited assessment of the cervical vertebral artery flow voids on sagittal imaging. No paraspinal soft tissue mass. Disc levels: Cervical spondylosis has not appreciably changed, with findings most notably as follows. At C5-C6, there is mild-to-moderate disc degeneration. Disc bulge with endplate spurring and bilateral uncovertebral hypertrophy. Facet arthrosis/ligamentum flavum hypertrophy. No significant spinal canal stenosis. At C6-C7, there is mild-to-moderate disc degeneration. Small disc bulge. Facet arthrosis/ligamentum flavum hypertrophy. No significant spinal canal stenosis. Please note, there is limited assessment of the neural foramina in the absence of axial imaging. MRI THORACIC SPINE FINDINGS Alignment:  No significant spondylolisthesis. Vertebrae: Vertebral body height is maintained. Grossly unchanged T1 hypointense and  T2 STIR hyperintense lesion within the T9 vertebral body with minimal peripheral enhancement. No focal suspicious osseous signal abnormality or enhancement  elsewhere within the thoracic spine. Cord: No appreciable signal abnormality or abnormal enhancement of the spinal cord at the thoracic levels. Paraspinal and other soft tissues: No paraspinal soft tissue mass. Disc levels: Mild thoracic spondylosis has not appreciably changed. No more than mild disc degeneration within the thoracic spine. Shallow multilevel disc bulges. No significant spinal canal stenosis. No appreciable significant foraminal stenosis. MRI LUMBAR SPINE FINDINGS Segmentation: 5 lumbar vertebrae. The caudal most well-formed intervertebral disc space is designated L5-S1. Alignment:  2 mm L3-L4 grade 1 retrolisthesis. Vertebrae: Vertebral body height is maintained. No focal suspicious osseous signal abnormality or enhancement. As before, there is mild degenerative appearing edema and enhancement within the posterior elements at L4 and L5. Conus medullaris: Extends to the L1-L2 level and appears normal. Paraspinal and other soft tissues: No paraspinal soft tissue mass. Disc levels: Lumbar spondylosis has not appreciably changed. Mild multilevel disc degeneration, greatest at L4-L5. As before, there are disc bulges at L3-L4, L4-L5 and L5-S1. Suspected superimposed small central disc protrusion at L4-L5. Multilevel facet arthrosis/ligamentum flavum hypertrophy, most prominent at L4-L5. No more than mild relative spinal canal narrowing. No appreciable high-grade foraminal stenosis on sagittal imaging. Redemonstrated small bilateral L4-L5 facet joint effusions. IMPRESSION: 1. Known bony metastasis within the T9 vertebral body with minimal peripheral enhancement, unchanged from the prior total spine MRI of 01/02/2021. No MR evidence of new osseous metastasis to the cervical, thoracic or lumbar spine. Additional sclerotic lesions seen on prior CT examinations are not appreciable by MR modality. 2. No pathologic fracture or epidural tumor involvement. 3. Cervical, thoracic and lumbar spondylosis, as outlined  and unchanged. No more than mild appreciable spinal canal narrowing. Electronically Signed   By: Kellie Simmering D.O.   On: 10/07/2021 12:39     ELIGIBLE FOR AVAILABLE RESEARCH PROTOCOL: no  ASSESSMENT: 60 y.o. [place] woman with stage IV left-sided breast cancer involving bone and central nervous system  (1) s/p left breast lower inner quadrant biopsy 06/19/2015 for a clinical T2-3 NX invasive ductal carcinoma, grade 2, triple positive.  (2) status post left mastectomy and axillary lymph node dissection  with immediate expander placement 07/18/2015 for an mpT4 pN2,stage IIIB invasive ductal carcinoma, grade 3, with negative margins.  (a) definitive implant exchange to be scheduled in December   METASTATIC DISEASE: October 2016  (3) CT scan of the chest abdomen and pelvis  09/16/2015 shows metastatic lesions in the right scapula, left iliac crest, L4, and T spine. There were questionable liver cysts, with repeat CT scan 03/02/2016 showing possible right upper lobe lung lesions and possibly increased liver lesions  (a) CT scan of the chest 06/17/2016 shows no active disease in the lungs or liver  (b) Bone scan July 2017 showed no evidence of bony metastatic disease   (c) head CT 07/08/2016 showed a cerebellar lesion, confirmed by MRI 07/23/2016, status post craniotomy 08/14/2016, confirming a metastatic deposit which was estrogen and progesterone receptor negative, HER-2 amplified with a signals ratio of 7.16, number per cell 13.25  (d) CA 27-29 is not informative  (4) received docetaxel every 3 weeks 6 together with trastuzumab and pertuzumab, last docetaxel dose 02/11/2016  (5) adjuvant radiation7/03/2016 to 06/26/2016 at [place]: 1. The Left chest wall was treated to 23.4 Gy in 13 fractions at 1.8 Gy per fraction. 2. The Left chest wall was boosted to 10 Gy in 5  fractions at 2 Gy per fraction. 3. The Left Sclav/PAB was treated to 23.4 Gy in 13 fractions at 1.8 Gy per fraction.   [Note:  Including the patient's treatment in [place] (received 15 fractions per Dr. Maryan Rued near [place], [place]), the patient received 50.4 Gy to the left chest wall and supraclavicular region. ]  (6) started trastuzumab and pertuzumab October 2016, continuing every 3 weeks,  (a) echocardiogram 02/26/2016 showed a well preserved ejection fraction  (b) echocardiogram 07/01/2016 shows an ejection fraction in the 60-65%   (c) pertuzumab discontinued 10/2016 with uncontrolled diarrhea  (d) echocardiogram 11/11/2016 showed an ejection fraction in the 60-65%  (e) echocardiogram 03/03/2017 shows an ejection fraction of 60-65%  (f) echocardiogram on 05/19/2017 shows an ejection fraction of 55-60%  (g) echocardiogram 09/24/2017 shows the ejection fraction in the 60-65%  (h) echocardiogram 02/14/2018 shows an ejection fraction in the 60-65%  (I) echocardiogram  06/30/2018 shows an ejection fraction in the 55-60%  (m) echocardiogram on 12/08/2018 shows an ejection fraction in the 60-65% range  (7) started denosumab/Xgeva October 2017 given every 4 weeks, transitioned to every 8 weeks beginning 10/11/18 (every 6 weeks while giving TDM1 every 3 weeks)  (a) Xgeva held after 08/21/2020 dose due to dental concerns  (8) started anastrozole October 2017   (a) bone scan 11/10/2016 shows no active disease  (b) chest CT scan 11/10/2016 stable, with no evidence of active disease  (c) chest CT and bone scan 07/02/2017 show no evidence of active disease  (d) CT scan of the chest with contrast 11/10/2017 shows some left axillary edema, but no evidence of thrombosis or adenopathy in that area, 0.9 cm precarinal lymph and 0.7 cm right upper lobe nodule node; bone lesions stable  (e) CT of the chest 05/04/2018 shows a 1.4 cm right lower paratracheal node which is slightly increased and a new right prevascular mediastinal node measuring 0.7 cm.  Bone lesions are stable.  (f) chest CT on 07/01/2018 shows no definite findings of  metastatic disease in the thorax. Previously noted borderline enlarged low right paratracheal lymph node is stable to slightly decreased in size   (e) chest CT on 12/30/2018 notes mild increase in right paratracheal adneopathy, recommended PET scan.  Pet scan on 01/19/2019 showed a hypermetabolic and enlarged right paratracheal lymph node consistent with breast cancer recurrence.  (f)Trastuzumab discontinued due to February 2020 scans   (g) TDM-1 started on 01/31/2019 given every 21 days.  (h) Chest CT 05/15/2019 shows decrease in mediastinal adenopathy  (i) CT chest on 08/17/2019 shows resolution of thoracic adenopathy  (j) TDM 1 discontinued after 10/11/2019 dose (8 months treatment) because of thrombocytopenia  (k) trastuzumab resumed 11/01/2019, repeated every 21 days  (l) echocardiogram 12/06/2019 showed an ejection fraction in the 60-65% range  (m) echo 08/12/2021 shows an ejection fraction in the 55-60% range.   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  (a) brain irradiation10/19/17-11/08/17: Whole brain/ 35 Gy in 14 fractions   (b) repeat brain MRI obtained 12/10/2016 shows no active disease in the brain  (c) repeat brain MRI 03/02/2017 shows no evidence of residual or recurrent disease  (d) repeat brain MRI 07/29/2017  shows no evidence of residual or recurrent disease  (e) repeat brain MRI 12/07/2017 shows no evidence of disease recurrence.  There is progressive white matter change secondary to prior treatment.  (f) repeat brain MRI 04/07/2018 showed no evidence of disease\  (g) repeat brain MRI on 08/23/2018 shows no evidence of disease  (h) repeat brain  MRI on 01/25/2019 shows no evidence of disease  (I) brain MRI 07/06/2019 shows no evidence of active disease  (j) brain MRI 01/11/2020 shows a subdural hematoma measuring 0.8 cm but no evidence of recurrent metastatic disease  (k) brain MRI 05/21/2020 no evidence of recurrent disease; chronic changes c/w prior treatment and recent subdural (otherwise resolved)  (l) brain MRI 01/02/2021 shows no evidence of recurrent disease.  There is some dural thickening in the area of prior subdural hematoma and continued follow-up is recommended.  (13) peripheral disease monitoring:  (A) chest CT scan, brain CT and bone scan 01/31/2020 show no evidence of active disease  (B) CT chest with contrast 06/10/2020 showed no evidence of actice/progressive disease (C) MRI total spine 01/03/2021 shows no enhancing bony mets, prior sclerotic lesions not well visualized  (D) CT of the chest 01/20/2021 stable, with unchanged bony metastasis, scattered subcentimeter low-attenuation liver lesions too small to characterize, and no measurable disease.  (E) PET scan 08/20/2021 shows no progression or active disease   PLAN: Chaunda is now 6 years out from definitive diagnosis of metastatic breast cancer with multiple sites of disease including the brain.  Her disease is very well controlled, with no evidence of activity.  She is also tolerating treatment well, with a normal echocardiogram and good laboratories.  Accordingly the plan is to continue treatment as before.  We are not using denosumab/Xgeva at present because of a history of osteonecrosis of the jaw.  This will need to be reviewed once she sees her oral surgeon next.  She is due for repeat brain MRI.  I have tried to schedule this sometime this month but she needs to be "put out completely" and there are only limited slots for that.  Hopefully at that will be done before she returns to see me in 9 weeks.  Am concerned that her ibuprofen use may be excessive.  I have  asked her to get off the 800 mg Motrin.  I have suggested she take naproxen 250 together with Tylenol 500 to at most 3 times daily instead.  I wrote those orders.  However those are over-the-counter medications and she needs to pay cash for those whereas she can get the ibuprofen 800 mg for $4 because it is a prescription.  We will see how she is doing on the suggested change when she sees me again at the next visit  Total encounter time 35 minutes.Sarajane Jews C. Magrinat, MD 10/22/21 5:57 AM Medical Oncology and Hematology [facility] [place], [place] [address] Tel. [phone]    Fax. [phone]   I, [name], am acting as scribe for Dr. Virgie Hunter. [name].  I, Shannon Del MD, have reviewed the above documentation for accuracy and completeness, and I agree with the above.   *Total Encounter Time as defined by the Centers for Medicare and Medicaid Services includes, in addition to the face-to-face time of a patient visit (documented in the note above) non-face-to-face time: obtaining and reviewing outside history, ordering and reviewing medications, tests or procedures, care coordination (communications with other health care professionals or caregivers) and documentation in  the medical record.

## 2021-10-30 ENCOUNTER — Ambulatory Visit: Payer: Medicaid Other | Admitting: Adult Health

## 2021-10-30 ENCOUNTER — Ambulatory Visit: Payer: Medicaid Other

## 2021-10-30 ENCOUNTER — Other Ambulatory Visit: Payer: Medicaid Other

## 2021-10-31 ENCOUNTER — Encounter: Payer: Self-pay | Admitting: Adult Health

## 2021-10-31 ENCOUNTER — Ambulatory Visit: Payer: Medicaid Other

## 2021-10-31 ENCOUNTER — Inpatient Hospital Stay: Payer: Medicaid Other | Admitting: Adult Health

## 2021-10-31 ENCOUNTER — Inpatient Hospital Stay: Payer: Medicaid Other | Attending: Medical

## 2021-10-31 ENCOUNTER — Other Ambulatory Visit: Payer: Self-pay

## 2021-10-31 ENCOUNTER — Inpatient Hospital Stay (HOSPITAL_BASED_OUTPATIENT_CLINIC_OR_DEPARTMENT_OTHER): Payer: Medicaid Other | Admitting: Adult Health

## 2021-10-31 ENCOUNTER — Inpatient Hospital Stay: Payer: Medicaid Other

## 2021-10-31 VITALS — BP 131/75 | HR 91 | Temp 97.9°F | Resp 18 | Ht 65.0 in | Wt 149.9 lb

## 2021-10-31 DIAGNOSIS — Z95828 Presence of other vascular implants and grafts: Secondary | ICD-10-CM

## 2021-10-31 DIAGNOSIS — C7931 Secondary malignant neoplasm of brain: Secondary | ICD-10-CM

## 2021-10-31 DIAGNOSIS — C50312 Malignant neoplasm of lower-inner quadrant of left female breast: Secondary | ICD-10-CM

## 2021-10-31 DIAGNOSIS — D696 Thrombocytopenia, unspecified: Secondary | ICD-10-CM

## 2021-10-31 DIAGNOSIS — C50011 Malignant neoplasm of nipple and areola, right female breast: Secondary | ICD-10-CM | POA: Diagnosis not present

## 2021-10-31 DIAGNOSIS — Z5112 Encounter for antineoplastic immunotherapy: Secondary | ICD-10-CM | POA: Diagnosis present

## 2021-10-31 DIAGNOSIS — Z79899 Other long term (current) drug therapy: Secondary | ICD-10-CM | POA: Diagnosis not present

## 2021-10-31 DIAGNOSIS — C7951 Secondary malignant neoplasm of bone: Secondary | ICD-10-CM | POA: Insufficient documentation

## 2021-10-31 DIAGNOSIS — C50119 Malignant neoplasm of central portion of unspecified female breast: Secondary | ICD-10-CM

## 2021-10-31 DIAGNOSIS — C50012 Malignant neoplasm of nipple and areola, left female breast: Secondary | ICD-10-CM

## 2021-10-31 DIAGNOSIS — C770 Secondary and unspecified malignant neoplasm of lymph nodes of head, face and neck: Secondary | ICD-10-CM

## 2021-10-31 LAB — CMP (CANCER CENTER ONLY)
ALT: 20 U/L (ref 0–44)
AST: 33 U/L (ref 15–41)
Albumin: 4.1 g/dL (ref 3.5–5.0)
Alkaline Phosphatase: 77 U/L (ref 38–126)
Anion gap: 10 (ref 5–15)
BUN: 12 mg/dL (ref 6–20)
CO2: 29 mmol/L (ref 22–32)
Calcium: 10.2 mg/dL (ref 8.9–10.3)
Chloride: 101 mmol/L (ref 98–111)
Creatinine: 1.01 mg/dL — ABNORMAL HIGH (ref 0.44–1.00)
GFR, Estimated: >60 mL/min (ref >60)
Glucose, Bld: 101 mg/dL — ABNORMAL HIGH (ref 70–99)
Potassium: 3.8 mmol/L (ref 3.5–5.1)
Sodium: 140 mmol/L (ref 135–145)
Total Bilirubin: 0.3 mg/dL (ref 0.3–1.2)
Total Protein: 6.9 g/dL (ref 6.5–8.1)

## 2021-10-31 LAB — CBC WITH DIFFERENTIAL (CANCER CENTER ONLY)
Abs Immature Granulocytes: 0.01 10*3/uL (ref 0.00–0.07)
Basophils Absolute: 0 10*3/uL (ref 0.0–0.1)
Basophils Relative: 1 %
Eosinophils Absolute: 0 10*3/uL (ref 0.0–0.5)
Eosinophils Relative: 0 %
HCT: 33.5 % — ABNORMAL LOW (ref 36.0–46.0)
Hemoglobin: 11.2 g/dL — ABNORMAL LOW (ref 12.0–15.0)
Immature Granulocytes: 0 %
Lymphocytes Relative: 23 %
Lymphs Abs: 0.8 10*3/uL (ref 0.7–4.0)
MCH: 30.2 pg (ref 26.0–34.0)
MCHC: 33.4 g/dL (ref 30.0–36.0)
MCV: 90.3 fL (ref 80.0–100.0)
Monocytes Absolute: 0.3 10*3/uL (ref 0.1–1.0)
Monocytes Relative: 8 %
Neutro Abs: 2.2 10*3/uL (ref 1.7–7.7)
Neutrophils Relative %: 68 %
Platelet Count: 124 10*3/uL — ABNORMAL LOW (ref 150–400)
RBC: 3.71 MIL/uL — ABNORMAL LOW (ref 3.87–5.11)
RDW: 13 % (ref 11.5–15.5)
WBC Count: 3.3 10*3/uL — ABNORMAL LOW (ref 4.0–10.5)
nRBC: 0 % (ref 0.0–0.2)

## 2021-10-31 MED ORDER — HEPARIN SOD (PORK) LOCK FLUSH 100 UNIT/ML IV SOLN
500.0000 [IU] | Freq: Once | INTRAVENOUS | Status: AC | PRN
  Administered 2021-10-31: 500 [IU]

## 2021-10-31 MED ORDER — TRASTUZUMAB-DKST CHEMO 150 MG IV SOLR
6.0000 mg/kg | Freq: Once | INTRAVENOUS | Status: AC
  Administered 2021-10-31: 399 mg via INTRAVENOUS
  Filled 2021-10-31: qty 19

## 2021-10-31 MED ORDER — SODIUM CHLORIDE 0.9% FLUSH
10.0000 mL | INTRAVENOUS | Status: DC | PRN
  Administered 2021-10-31: 10 mL

## 2021-10-31 MED ORDER — SODIUM CHLORIDE 0.9% FLUSH
10.0000 mL | INTRAVENOUS | Status: AC | PRN
  Administered 2021-10-31: 10 mL

## 2021-10-31 MED ORDER — SODIUM CHLORIDE 0.9 % IV SOLN: Freq: Once | INTRAVENOUS | Status: AC

## 2021-10-31 MED ORDER — ACETAMINOPHEN 325 MG PO TABS
650.0000 mg | ORAL_TABLET | Freq: Once | ORAL | Status: AC
  Administered 2021-10-31: 650 mg via ORAL

## 2021-10-31 NOTE — Patient Instructions (Signed)
[facility]  Discharge Instructions: ?Thank you for choosing [facility] to provide your oncology and hematology care.  ? ?If you have a lab appointment with the Cancer Center, please go directly to the Cancer Center and check in at the registration area. ?  ?Wear comfortable clothing and clothing appropriate for easy access to any Portacath or PICC line.  ? ?We strive to give you quality time with your provider. You may need to reschedule your appointment if you arrive late (15 or more minutes).  Arriving late affects you and other patients whose appointments are after yours.  Also, if you miss three or more appointments without notifying the office, you may be dismissed from the clinic at the provider?s discretion.    ?  ?For prescription refill requests, have your pharmacy contact our office and allow 72 hours for refills to be completed.   ? ?Today you received the following chemotherapy and/or immunotherapy agents: Trastuzumab    ?  ?To help prevent nausea and vomiting after your treatment, we encourage you to take your nausea medication as directed. ? ?BELOW ARE SYMPTOMS THAT SHOULD BE REPORTED IMMEDIATELY: ?*FEVER GREATER THAN 100.4 F (38 ?C) OR HIGHER ?*CHILLS OR SWEATING ?*NAUSEA AND VOMITING THAT IS NOT CONTROLLED WITH YOUR NAUSEA MEDICATION ?*UNUSUAL SHORTNESS OF BREATH ?*UNUSUAL BRUISING OR BLEEDING ?*URINARY PROBLEMS (pain or burning when urinating, or frequent urination) ?*BOWEL PROBLEMS (unusual diarrhea, constipation, pain near the anus) ?TENDERNESS IN MOUTH AND THROAT WITH OR WITHOUT PRESENCE OF ULCERS (sore throat, sores in mouth, or a toothache) ?UNUSUAL RASH, SWELLING OR PAIN  ?UNUSUAL VAGINAL DISCHARGE OR ITCHING  ? ?Items with * indicate a potential emergency and should be followed up as soon as possible or go to the Emergency Department if any problems should occur. ? ?Please show the CHEMOTHERAPY ALERT CARD or IMMUNOTHERAPY ALERT CARD at check-in  to the Emergency Department and triage nurse. ? ?Should you have questions after your visit or need to cancel or reschedule your appointment, please contact [facility]  Dept: [phone]  and follow the prompts.  Office hours are 8:00 a.m. to 4:30 p.m. Monday - Friday. Please note that voicemails left after 4:00 p.m. may not be returned until the following business day.  We are closed weekends and major holidays. You have access to a nurse at all times for urgent questions. Please call the main number to the clinic Dept: [phone] and follow the prompts. ? ? ?For any non-urgent questions, you may also contact your provider using MyChart. We now offer e-Visits for anyone 18 and older to request care online for non-urgent symptoms. For details visit [email or web address]. ?  ?Also download the MyChart app! Go to the app store, search "MyChart", open the app, select [facility], and log in with your MyChart username and password. ? ?Due to Covid, a mask is required upon entering the hospital/clinic. If you do not have a mask, one will be given to you upon arrival. For doctor visits, patients may have 1 support person aged 18 or older with them. For treatment visits, patients cannot have anyone with them due to current Covid guidelines and our immunocompromised population.  ? ?

## 2021-10-31 NOTE — Progress Notes (Signed)
[facility]  [phone] [phone]     ID: [name] DOB: [birth year]  MR#: [id number]  XTA#:[id number]  [name] Care Team: Shannon Rakes, MD as PCP - General (Family Medicine) Shannon Dresser, MD as PCP - Advanced Heart Failure (Cardiology) Shannon Rudd, MD as Consulting Physician (Radiation Oncology) Shannon Levine, MD as Consulting Physician (Neurosurgery) Shannon Pilgrim, MD as Consulting Physician (Psychiatry) Shannon Skinner Acey Lav, MD as Consulting Physician (Psychiatry) Shannon Settle, MD as Referring Physician (Plastic Surgery) Shannon Limbo, MD as Consulting Physician (Plastic Surgery) Dillingham, Loel Lofty, DO as Attending Physician (Plastic Surgery) [name], DMD (Dentistry) Shannon Pike, MD as Consulting Physician (Hematology and Oncology)   CHIEF COMPLAINT: Metastatic triple positive breast cancer  CURRENT TREATMENT: trastuzumab [name] 21 days], [denosumab/Xgeva (Q6W)], anastrozole   INTERVAL HISTORY: [name] returns today for follow up and treatment of her metastatic triple positive breast cancer.   [name] continues on anastrozole with good tolerance.  She has no concerns about taking this.  She is also receiving Herceptin every 3 weeks with good tolerance.  Her most recent echocardiogram was in September and it was normal.  In response recent imaging was completed with a PET scan at the end of September, 2022 and it showed no FDG avid metastatic disease, only non-hypermetabolic osseous mets metastases.  Her last Xgeva dose was given in February 2022.  We have been holding it since then due to her having a dental issue and needing an extraction.  Overall she says she is feeling well, and has no current concerns.  She is taking the Tylenol and Aleve as Dr. Jana Hunter had suggested.  She is not taking as much ibuprofen as she was previously.   REVIEW OF SYSTEMS: Review of Systems  Constitutional:  Negative for  appetite change, chills, fatigue, fever and unexpected weight change.  HENT:   Negative for hearing loss, lump/mass and trouble swallowing.   Eyes:  Negative for eye problems and icterus.  Respiratory:  Negative for chest tightness, cough and shortness of breath.   Cardiovascular:  Negative for chest pain, leg swelling and palpitations.  Gastrointestinal:  Negative for abdominal distention, abdominal pain, constipation, diarrhea, nausea and vomiting.  Endocrine: Negative for hot flashes.  Genitourinary:  Negative for difficulty urinating.   Musculoskeletal:  Negative for arthralgias.  Skin:  Negative for itching and rash.  Neurological:  Negative for dizziness, extremity weakness, headaches and numbness.  Hematological:  Negative for adenopathy. Does not bruise/bleed easily.  Psychiatric/Behavioral:  Negative for depression. [name] is not nervous/anxious.      COVID 19 VACCINATION STATUS: Status post Pfizer x2 with booster September 2021   BREAST CANCER HISTORY: From the original intake note:  [name] was aware of a "lemon sized lump in" her left axilla for about a year before bringing it to medical attention. By then she had developed left breast and left axillary swelling (June 2016). She presented to the local emergency room and had a chest CT scan 06/06/2015 which showed a nodule in the left breast measuring 0.9 cm and questionable left axillary adenopathy. She then proceeded to bilateral diagnostic mammography and left breast ultrasonography 06/19/2015. There were no prior films for comparison (last mammography 12 years prior).. The breast density was category C. Mammography showed in the left breast upper inner quadrant a 7 cm area including a small mass and significant pleomorphic calcifications. Ultrasonography defined the mass as measuring 1.2 cm. The left axilla appeared unremarkable. There was significant skin edema.  Biopsy  of the left breast mass 06/19/2015 showed ([id number])  an invasive ductal carcinoma, grade 2, estrogen receptor 83% positive, progesterone receptor 26% positive, and HER-2 amplified by immunohistochemstry with a 3+ reading. [name] had biopsies of a separate area in the left breast August of the same year and this showed atypical ductal hyperplasia. (SP [id number]).  Accordingly after appropriate discussion on 08/21/2015 [name] proceeded to left mastectomy with left axillary sentinel lymph node sampling, which, since the lymph nodes were positive, extended to the procedure to left axillary lymph node dissection. The pathology (SP [id number]) showed an invasive ductal carcinoma, grade 3, measuring in excess of 9 cm. There were also skin satellites, not contiguous with the invasive carcinoma. Margins were clear and ample. There was evidence of lymphovascular invasion. A total of 15 lymph nodes were removed, including 5 sentinel lymph nodes, all of which were positive, so that the final total was 14 out of 15 lymph nodes involved by tumor. There was evidence of extranodal extension. The final pathology was pT4b pN2a, stage IIIB  CA-27-29 and CEA 09/19/2015 were non-informative October 2016.  Unfortunately CT scans of the chest abdomen and pelvis 09/16/2015 showed bony metastases to the ri/ght scapula, left iliac crest, and also L4 and T-spine. There were questionable liver cysts which on repeat CT scan 03/02/2016 appear to be a little bit more well-defined, possibly a little larger. There were also some possible right upper lobe lung lesions.  Adjuvant treatment consisted of docetaxel, trastuzumab and pertuzumab, with the final (6th) docetaxel dose given 02/11/2016. She continues on trastuzumab and pertuzumab, with the 11th cycle given 05/05/2016. Echocardiogram 02/26/2016 showed an ejection fraction of 55%. She receives denosumab/Xgeva every 4 weeks.. She also receives radiation, started 06/09/201, to be completed 06/26/2016.  Her subsequent history is as  detailed below   PAST MEDICAL HISTORY: Past Medical History:  Diagnosis Date   Alcohol abuse    none since 2013   Anemia    during chemo   Anxiety    At age 43   Arthritis Dx 2010   Bipolar disorder ([facility] [place])    Breast cancer ([place])    Bronchitis    Cancer ([facility] [place])    breast mets to brain   CHF (congestive heart failure) ([place])    Chronic pain    resolved per [name] 1/70/01   Complication of anesthesia    Depression    Family history of adverse reaction to anesthesia    MOther had PONV   GERD (gastroesophageal reflux disease)    Headache    hx  migraines   HLD (hyperlipidemia)    Hypertension    Lymphedema of left arm    Opiate dependence (H[facility])    PONV (postoperative nausea and vomiting)    Port-A-Cath in place    PTSD (post-traumatic stress disorder)    S/P endometrial ablation    in md's office    PAST SURGICAL HISTORY: Past Surgical History:  Procedure Laterality Date   APPLICATION OF CRANIAL NAVIGATION N/A 08/14/2016   Procedure: APPLICATION OF CRANIAL NAVIGATION;  Surgeon: Shannon Levine, MD;  Location: [facility];  Service: Neurosurgery;  Laterality: N/A;   BREAST RECONSTRUCTION Left    with silicone implant   COLONOSCOPY W/ POLYPECTOMY     CRANIOTOMY N/A 08/14/2016   Procedure: CRANIOTOMY TUMOR EXCISION WITH Lucky Rathke;  Surgeon: Shannon Levine, MD;  Location: [facility];  Service: Neurosurgery;  Laterality: N/A;   FIBULA FRACTURE SURGERY Left    MASTECTOMY Left  RADIOLOGY WITH ANESTHESIA N/A 07/23/2016   Procedure: MRI OF BRAIN WITH AND WITHOUT;  Surgeon: Medication Radiologist, MD;  Location: [facility];  Service: Radiology;  Laterality: N/A;   RADIOLOGY WITH ANESTHESIA N/A 09/08/2016   Procedure: MRI OF BRAIN WITH AND WITHOUT CONTRAST;  Surgeon: Medication Radiologist, MD;  Location: [facility];  Service: Radiology;  Laterality: N/A;   RADIOLOGY WITH ANESTHESIA N/A 12/10/2016   Procedure: MRI OF BRAIN WITH AND WITHOUT;  Surgeon: Medication Radiologist, MD;  Location: [facility];  Service: Radiology;  Laterality: N/A;   RADIOLOGY WITH ANESTHESIA N/A 03/02/2017   Procedure: MRI of BRAIN W and W/OUT CONTRAST;  Surgeon: Medication Radiologist, MD;  Location: [facility];  Service: Radiology;  Laterality: N/A;   RADIOLOGY WITH ANESTHESIA N/A 07/29/2017   Procedure: RADIOLOGY WITH ANESTHESIA MRI OF BRAIN WITH AND WITHOUT CONTRAST;  Surgeon: Radiologist, Medication, MD;  Location: [facility];  Service: Radiology;  Laterality: N/A;   RADIOLOGY WITH ANESTHESIA N/A 12/07/2017   Procedure: MRI WITH ANESTHESIA OF BRAIN WITH AND WITHOUT CONTRAST;  Surgeon: Radiologist, Medication, MD;  Location: [facility];  Service: Radiology;  Laterality: N/A;   RADIOLOGY WITH ANESTHESIA N/A 04/07/2018   Procedure: MRI OF BRAIN WITH AND WITHOUT CONTRAST;  Surgeon: Radiologist, Medication, MD;  Location: [facility];  Service: Radiology;  Laterality: N/A;   RADIOLOGY WITH ANESTHESIA N/A 08/23/2018   Procedure: MRI WITH ANESTHESIA OF THE BRAIN WITH AND WITHOUT;  Surgeon: Radiologist, Medication, MD;  Location: [facility];  Service: Radiology;  Laterality: N/A;   RADIOLOGY WITH ANESTHESIA N/A 01/24/2019   Procedure: MRI OF BRAIN WITH AND WITHOUT CONTRAST;  Surgeon: Radiologist, Medication, MD;  Location: [facility];  Service: Radiology;  Laterality: N/A;   RADIOLOGY WITH ANESTHESIA N/A 07/06/2019   Procedure: MRI WITH ANESTHESIA OF BRAIN WITH AND WITHOUT CONTRAST;  Surgeon: Radiologist, Medication, MD;  Location: [facility];  Service: Radiology;  Laterality: N/A;   RADIOLOGY WITH ANESTHESIA N/A 01/11/2020   Procedure: MRI WITH ANESTHESIA BRAIN WITH AND WITHOUT;  Surgeon: Radiologist, Medication, MD;  Location: [facility];  Service: Radiology;  Laterality: N/A;   RADIOLOGY WITH ANESTHESIA N/A 05/21/2020   Procedure: MRI WITH ANESTHESIA OF BRAIN WITH AND WITHOUT CONTRAST;  Surgeon: Radiologist, Medication, MD;  Location: [facility];  Service: Radiology;  Laterality: N/A;   RADIOLOGY WITH ANESTHESIA N/A 01/02/2021   Procedure: MRI WITH ANESTHESIA  BRAIN WITH  AND WITHOUT CONTRAST,TOTAL SPINE MET SCREENING;  Surgeon: Radiologist, Medication, MD;  Location: [facility];  Service: Radiology;  Laterality: N/A;   RADIOLOGY WITH ANESTHESIA N/A 10/07/2021   Procedure: MRI BRAIN WITH AND WITHOUT CONTRAST; MRI TOTAL SPINE;  METS SCREENING WITH ANESTHESIA;  Surgeon: Radiologist, Medication, MD;  Location: [facility];  Service: Radiology;  Laterality: N/A;   right power port placement Right     FAMILY HISTORY Family History  Problem Relation Age of Onset   Diabetes Mother    Bipolar disorder Mother    CAD Father    Breast cancer Neg Hx   [name]'s father still living, age 59, in [place]. He had prostate cancer at some point in the past. [name]'s mother died at age 42 from complications of diabetes. [name] had no brothers, 2 sisters. A paternal grandmother had lung cancer in the setting of tobacco abuse. There is no other history of cancer in the family to her knowledge   GYNECOLOGIC HISTORY:  No LMP recorded. [name] has had an ablation. Menarche approximately age 37. First live birth in 29. [name]  is GX P2. She underwent endometrial ablation in 2016.   SOCIAL HISTORY: (Updated April 2022) [name] is originally from [place]. She moved to this area from [place] because it was less expensive here she says.   She now has has her own 1 bedroom apartment on [address].  She is divorced. Her 2 children are [name] who lives in [place] and works as a Development worker, community, and [place] who lives in [place] and works as Freight forwarder of a healthcare facility.  [name] has 2 children, Interior and spatial designer and Grayce Sessions.  He married in 2022, his wife Minette Brine, has 2 children of her own plus a step grandson).  [name] has no children.    ADVANCED DIRECTIVES: Not in place; at the 06/03/2016 visit [name] was given the appropriate forms to complete and notarize at her discretion   HEALTH MAINTENANCE: Social History   Tobacco Use   Smoking status: Former     Packs/day: 1.00    Years: 43.00    Pack years: 43.00    Types: Cigarettes   Smokeless tobacco: Never   Tobacco comments:    3 day since last cigarette  Vaping Use   Vaping Use: Some days  Substance Use Topics   Alcohol use: No    Comment: no ETOH since 08/22/12   Drug use: No    Comment: states she's in recovery program for 7 years     Colonoscopy:  PAP:  Bone density:   Allergies  Allergen Reactions   Demerol [Meperidine Hcl] Itching and Nausea And Vomiting    INTOLERANCE >  N & V   Erythromycin Rash    Current Outpatient Medications on File Prior to Visit  Medication Sig Dispense Refill   cyclobenzaprine (FLEXERIL) 10 MG tablet TAKE 1 TABLET BY MOUTH TWICE A DAY ([name] not taking: Reported on 10/01/2021) 60 tablet 1   acetaminophen (TYLENOL) 500 MG tablet Take 1 tablet (500 mg total) by mouth 2 (two) times daily as needed. Take with naproxen 250 mg tablet ([name] taking differently: Take 500 mg by mouth 2 (two) times daily. Take with naproxen 250 mg tablet) 180 tablet 4   AMITIZA 8 MCG capsule TAKE 1 CAPSULE (8 MCG TOTAL) BY MOUTH 2 (TWO) TIMES DAILY WITH A MEAL. 60 capsule 1   anastrozole (ARIMIDEX) 1 MG tablet Take 1 tablet (1 mg total) by mouth daily. 90 tablet 4   azelastine (ASTELIN) 0.1 % nasal spray Place 2 sprays into both nostrils daily. Use in each nostril as directed ([name] taking differently: Place 1 spray into both nostrils daily. Use in each nostril as directed) 30 mL 12   carvedilol (COREG) 3.125 MG tablet TAKE 1 TABLET (3.125 MG TOTAL) BY MOUTH 2 TIMES DAILY FOR 10 DAYS, THEN 2 TABLETS 2 TIMES DAILY. ([name] not taking: Reported on 10/01/2021) 120 tablet 3   cholecalciferol (VITAMIN D3) 25 MCG (1000 UNIT) tablet Take 2 tablets (2,000 Units total) by mouth daily. ([name] taking differently: Take 5,000 Units by mouth daily.) 90 tablet 4   cromolyn (OPTICROM) 4 % ophthalmic solution Place 1 drop into both eyes daily.     docusate sodium (COLACE) 100 MG capsule  TAKE 1 CAPSULE BY MOUTH ONCE A DAY 100 capsule 6   fluticasone (FLONASE) 50 MCG/ACT nasal spray SPRAY 2 SPRAYS INTO EACH NOSTRIL EVERY DAY 16 mL 2   gabapentin (NEURONTIN) 300 MG capsule Take 300-600 mg by mouth See admin instructions. Take 300 mg in the morning and 600 mg at bedtime  ibuprofen (ADVIL) 800 MG tablet TAKE 1 TABLET BY MOUTH 2 (TWO) TIMES DAILY AS NEEDED FOR MODERATE PAIN. TAKE WITH FOOD. ([name] not taking: Reported on 10/01/2021) 60 tablet 6   lamoTRIgine (LAMICTAL) 100 MG tablet Take 100 mg by mouth every morning.     loratadine (CLARITIN) 10 MG tablet TAKE 1 TABLET BY MOUTH EVERY DAY 90 tablet 3   Lurasidone HCl 60 MG TABS Take 60 mg by mouth daily.     naproxen (NAPROSYN) 250 MG tablet Take 1 tablet (250 mg total) by mouth 2 (two) times daily as needed. Take together with tylenol 500 mg tablet ([name] taking differently: Take 250 mg by mouth 2 (two) times daily. Take together with tylenol 500 mg tablet) 180 tablet 4   ondansetron (ZOFRAN) 8 MG tablet TAKE 1 TABLET BY MOUTH EVERY 8 HOURS AS NEEDED FOR NAUSEA AND VOMITING 90 tablet 1   pantoprazole (PROTONIX) 40 MG tablet TAKE 1 TABLET BY MOUTH EVERY DAY 90 tablet 3   polyethylene glycol (MIRALAX / GLYCOLAX) 17 g packet Take 17 g by mouth daily as needed for mild constipation. ([name] taking differently: Take 17 g by mouth every 3 (three) days.) 30 each 6   rosuvastatin (CRESTOR) 10 MG tablet TAKE 1 TABLET BY MOUTH EVERY DAY 90 tablet 3   traZODone (DESYREL) 100 MG tablet Take 1 tablet (100 mg total) by mouth at bedtime. ([name] taking differently: Take 200-300 mg by mouth at bedtime.) 30 tablet 0   vitamin C (ASCORBIC ACID) 500 MG tablet Take 500 mg by mouth daily.     No current facility-administered medications on file prior to visit.    OBJECTIVE: White Hunter in no acute distress Vitals:   10/31/21 0940  BP: 131/75  Pulse: 91  Resp: 18  Temp: 97.9 F (36.6 C)  TempSrc: Tympanic  SpO2: 97%  Weight: 149 lb 14.4 oz  (68 kg)  Height: 5' 5" (1.651 m)  Body mass index is 24.94 kg/m.  ECOG FS: 2   GENERAL: [name] is a well appearing female in no acute distress HEENT:  Sclerae anicteric.  Oropharynx clear and moist. No ulcerations or evidence of oropharyngeal candidiasis. Neck is supple.  NODES:  No cervical, supraclavicular, or axillary lymphadenopathy palpated.  BREAST EXAM:  Deferred. LUNGS:  Clear to auscultation bilaterally.  No wheezes or rhonchi. HEART:  Regular rate and rhythm. No murmur appreciated. ABDOMEN:  Soft, nontender.  Positive, normoactive bowel sounds. No organomegaly palpated. MSK:  No focal spinal tenderness to palpation. Full range of motion bilaterally in the upper extremities. EXTREMITIES:  No peripheral edema.   SKIN:  Clear with no obvious rashes or skin changes. No nail dyscrasia. NEURO:  Nonfocal. Well oriented.  Appropriate affect.   LAB RESULTS: No results found for: LABCA2  CBC    Component Value Date/Time   WBC 3.3 (L) 10/31/2021 0917   WBC 3.8 (L) 03/27/2021 0941   RBC 3.71 (L) 10/31/2021 0917   HGB 11.2 (L) 10/31/2021 0917   HGB 12.1 10/26/2017 1038   HCT 33.5 (L) 10/31/2021 0917   HCT 35.7 10/26/2017 1038   PLT 124 (L) 10/31/2021 0917   PLT 167 10/26/2017 1038   MCV 90.3 10/31/2021 0917   MCV 98.4 10/26/2017 1038   MCH 30.2 10/31/2021 0917   MCHC 33.4 10/31/2021 0917   RDW 13.0 10/31/2021 0917   RDW 13.3 10/26/2017 1038   LYMPHSABS 0.8 10/31/2021 0917   LYMPHSABS 0.5 (L) 10/26/2017 1038   MONOABS 0.3 10/31/2021 2458  MONOABS 0.1 10/26/2017 1038   EOSABS 0.0 10/31/2021 0917   EOSABS 0.0 10/26/2017 1038   BASOSABS 0.0 10/31/2021 0917   BASOSABS 0.0 10/26/2017 1038   CMP Latest Ref Rng & Units 10/31/2021 10/03/2021 09/11/2021  Glucose 70 - 99 mg/dL 101(H) 97 103(H)  BUN 6 - 20 mg/dL _0 Creatinine 0.44 - 1.00 mg/dL 1.01(H) 0.98 1.05(H)  Sodium 135 - 145 mmol/L 140 141 137  Potassium 3.5 - 5.1 mmol/L 3.8 3.9 3.5  Chloride 98 - 111 mmol/L 101  103 100  CO2 22 - 32 mmol/L _1 Calcium 8.9 - 10.3 mg/dL 10.2 9.2 9.3  Total Protein 6.5 - 8.1 g/dL 6.9 6.8 7.4  Total Bilirubin 0.3 - 1.2 mg/dL 0.3 0.3 0.4  Alkaline Phos 38 - 126 U/L 77 75 75  AST 15 - 41 U/L 33 29 24  ALT 0 - 44 U/L _2 STUDIES: MR Brain W Wo Contrast  Result Date: 10/07/2021 CLINICAL DATA:  Secondary malignant neoplasm of cervical lymph node. Primary cancer of lower inner quadrant of left female breast. Brain metastases. Breast cancer, assess treatment response; brain metastases. EXAM: MRI HEAD WITHOUT AND WITH CONTRAST TECHNIQUE: Multiplanar, multiecho pulse sequences of the brain and surrounding structures were obtained without and with intravenous contrast. CONTRAST:  64m GADAVIST GADOBUTROL 1 MMOL/ML IV SOLN COMPARISON:  Prior brain MRI examinations 01/02/2021 and earlier. FINDINGS: Brain: Generalized cerebral and cerebellar atrophy, stable. Redemonstrated advanced, confluent T2 FLAIR hyperintense signal abnormality within the bilateral cerebral white matter, likely reflecting treatment-related changes. As before, there are a few scattered chronic microhemorrhages within the supratentorial and infratentorial brain. Persistent although decreased smooth dural thickening and enhancement overlying the right parietal lobe, likely related to prior subdural hematoma at this site. Redemonstrated sequela of prior left occipital craniotomy for metastasis resection. Stable T2 FLAIR hyperintense signal abnormality surrounding the resection site. No abnormal enhancement at this site to suggest recurrent tumor. No abnormal intracranial enhancement elsewhere to suggest a new intracranial metastasis. No acute infarct. No midline shift. Vascular: Maintained flow voids within the proximal large arterial vessels. Skull and upper cervical spine: Prior left occipital craniotomy. No focal suspicious marrow lesion. Sinuses/Orbits: Visualized orbits show no acute finding. Bilateral lens  replacements. Tiny left maxillary sinus mucous retention cyst. Other: Trace fluid within the right mastoid air cells. IMPRESSION: Stable postoperative changes from prior left cerebellar metastasis resection. No evidence of recurrent tumor at this site. No new intracranial metastases. Persistent although decreased smooth dural thickening and enhancement overlying the right parietal lobe, likely related to prior subdural hematoma at this site. Electronically Signed   By: KKellie SimmeringD.O.   On: 10/07/2021 11:29   MR TOTAL SPINE METS SCREENING  Result Date: 10/07/2021 CLINICAL DATA:  Bone metastases. Brain metastases. Bilateral malignant neoplasm involving both nipple and areola in female, unspecified estrogen receptor status. EXAM: MRI TOTAL SPINE WITHOUT AND WITH CONTRAST TECHNIQUE: Multisequence MR imaging of the spine from the cervical spine to the sacrum was performed prior to and following IV contrast administration for evaluation of spinal metastatic disease. CONTRAST:  755mGADAVIST GADOBUTROL 1 MMOL/ML IV SOLN COMPARISON:  PET-CT 08/19/2021. MRI of the total spine 01/02/2021. FINDINGS: MRI CERVICAL SPINE FINDINGS Alignment: No significant spondylolisthesis. Vertebrae: Redemonstrated small Schmorl node within the C6 inferior endplate. Vertebral body height is otherwise maintained. No focal suspicious osseous signal abnormality or enhancement. Cord: No appreciable cervical spinal cord signal abnormality or abnormal cord enhancement. Posterior Fossa, vertebral  arteries, paraspinal tissues: Posterior fossa better assessed on same-day brain MRI. Limited assessment of the cervical vertebral artery flow voids on sagittal imaging. No paraspinal soft tissue mass. Disc levels: Cervical spondylosis has not appreciably changed, with findings most notably as follows. At C5-C6, there is mild-to-moderate disc degeneration. Disc bulge with endplate spurring and bilateral uncovertebral hypertrophy. Facet  arthrosis/ligamentum flavum hypertrophy. No significant spinal canal stenosis. At C6-C7, there is mild-to-moderate disc degeneration. Small disc bulge. Facet arthrosis/ligamentum flavum hypertrophy. No significant spinal canal stenosis. Please note, there is limited assessment of the neural foramina in the absence of axial imaging. MRI THORACIC SPINE FINDINGS Alignment:  No significant spondylolisthesis. Vertebrae: Vertebral body height is maintained. Grossly unchanged T1 hypointense and T2 STIR hyperintense lesion within the T9 vertebral body with minimal peripheral enhancement. No focal suspicious osseous signal abnormality or enhancement elsewhere within the thoracic spine. Cord: No appreciable signal abnormality or abnormal enhancement of the spinal cord at the thoracic levels. Paraspinal and other soft tissues: No paraspinal soft tissue mass. Disc levels: Mild thoracic spondylosis has not appreciably changed. No more than mild disc degeneration within the thoracic spine. Shallow multilevel disc bulges. No significant spinal canal stenosis. No appreciable significant foraminal stenosis. MRI LUMBAR SPINE FINDINGS Segmentation: 5 lumbar vertebrae. The caudal most well-formed intervertebral disc space is designated L5-S1. Alignment:  2 mm L3-L4 grade 1 retrolisthesis. Vertebrae: Vertebral body height is maintained. No focal suspicious osseous signal abnormality or enhancement. As before, there is mild degenerative appearing edema and enhancement within the posterior elements at L4 and L5. Conus medullaris: Extends to the L1-L2 level and appears normal. Paraspinal and other soft tissues: No paraspinal soft tissue mass. Disc levels: Lumbar spondylosis has not appreciably changed. Mild multilevel disc degeneration, greatest at L4-L5. As before, there are disc bulges at L3-L4, L4-L5 and L5-S1. Suspected superimposed small central disc protrusion at L4-L5. Multilevel facet arthrosis/ligamentum flavum hypertrophy, most  prominent at L4-L5. No more than mild relative spinal canal narrowing. No appreciable high-grade foraminal stenosis on sagittal imaging. Redemonstrated small bilateral L4-L5 facet joint effusions. IMPRESSION: 1. Known bony metastasis within the T9 vertebral body with minimal peripheral enhancement, unchanged from the prior total spine MRI of 01/02/2021. No MR evidence of new osseous metastasis to the cervical, thoracic or lumbar spine. Additional sclerotic lesions seen on prior CT examinations are not appreciable by MR modality. 2. No pathologic fracture or epidural tumor involvement. 3. Cervical, thoracic and lumbar spondylosis, as outlined and unchanged. No more than mild appreciable spinal canal narrowing. Electronically Signed   By: Kellie Simmering D.O.   On: 10/07/2021 12:39     ELIGIBLE FOR AVAILABLE RESEARCH PROTOCOL: no  ASSESSMENT: 60 y.o. 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  (2) status post left mastectomy and axillary lymph node dissection  with immediate expander placement 07/18/2015 for an mpT4 pN2,stage IIIB invasive ductal carcinoma, grade 3, with negative margins.  (a) definitive implant exchange to be scheduled in December   METASTATIC DISEASE: October 2016  (3) CT scan of the chest abdomen and pelvis  09/16/2015 shows metastatic lesions in the right scapula, left iliac crest, L4, and T spine. There were questionable liver cysts, with repeat CT scan 03/02/2016 showing possible right upper lobe lung lesions and possibly increased liver lesions  (a) CT scan of the chest 06/17/2016 shows no active disease  in the lungs or liver  (b) Bone scan July 2017 showed no evidence of bony metastatic disease   (c) head CT 07/08/2016 showed a cerebellar lesion, confirmed by MRI 07/23/2016, status post  craniotomy 08/14/2016, confirming a metastatic deposit which was estrogen and progesterone receptor negative, HER-2 amplified with a signals ratio of 7.16, number per cell 13.25  (d) CA 27-29 is not informative  (4) received docetaxel every 3 weeks 6 together with trastuzumab and pertuzumab, last docetaxel dose 02/11/2016  (5) adjuvant radiation7/03/2016 to 06/26/2016 at [place]: 1. The Left chest wall was treated to 23.4 Gy in 13 fractions at 1.8 Gy per fraction. 2. The Left chest wall was boosted to 10 Gy in 5 fractions at 2 Gy per fraction. 3. The Left Sclav/PAB was treated to 23.4 Gy in 13 fractions at 1.8 Gy per fraction.   [Note: Including [name]'s treatment in [place] (received 15 fractions per Dr. Maryan Rued near [place], [place]), [name] received 50.4 Gy to the left chest wall and supraclavicular region. ]  (6) started trastuzumab and pertuzumab October 2016, continuing every 3 weeks,  (a) echocardiogram 02/26/2016 showed a well preserved ejection fraction  (b) echocardiogram 07/01/2016 shows an ejection fraction in the 60-65%   (c) pertuzumab discontinued 10/2016 with uncontrolled diarrhea  (d) echocardiogram 11/11/2016 showed an ejection fraction in the 60-65%  (e) echocardiogram 03/03/2017 shows an ejection fraction of 60-65%  (f) echocardiogram on 05/19/2017 shows an ejection fraction of 55-60%  (g) echocardiogram 09/24/2017 shows the ejection fraction in the 60-65%  (h) echocardiogram 02/14/2018 shows an ejection fraction in the 60-65%  (I) echocardiogram  06/30/2018 shows an ejection fraction in the 55-60%  (m) echocardiogram on 12/08/2018 shows an ejection fraction in the 60-65% range  (7) started denosumab/Xgeva October 2017 given every 4 weeks, transitioned to every 8 weeks beginning 10/11/18 (every 6 weeks while giving TDM1 every 3 weeks)  (a) Xgeva held after 08/21/2020 dose due to dental concerns  (8) started anastrozole October 2017   (a) bone scan 11/10/2016  shows no active disease  (b) chest CT scan 11/10/2016 stable, with no evidence of active disease  (c) chest CT and bone scan 07/02/2017 show no evidence of active disease  (d) CT scan of the chest with contrast 11/10/2017 shows some left axillary edema, but no evidence of thrombosis or adenopathy in that area, 0.9 cm precarinal lymph and 0.7 cm right upper lobe nodule node; bone lesions stable  (e) CT of the chest 05/04/2018 shows a 1.4 cm right lower paratracheal node which is slightly increased and a new right prevascular mediastinal node measuring 0.7 cm.  Bone lesions are stable.  (f) chest CT on 07/01/2018 shows no definite findings of metastatic disease in the thorax. Previously noted borderline enlarged low right paratracheal lymph node is stable to slightly decreased in size   (e) chest CT on 12/30/2018 notes mild increase in right paratracheal adneopathy, recommended PET scan.  Pet scan on 01/19/2019 showed a hypermetabolic and enlarged right paratracheal lymph node consistent with breast cancer recurrence.  (f)Trastuzumab discontinued due to February 2020 scans   (g) TDM-1 started on 01/31/2019 given every 21 days.  (h) Chest CT 05/15/2019 shows decrease in mediastinal adenopathy  (i) CT chest on 08/17/2019 shows resolution of thoracic adenopathy  (j) TDM 1 discontinued after 10/11/2019 dose (8 months treatment) because of thrombocytopenia  (k) trastuzumab resumed 11/01/2019, repeated every 21 days  (l) echocardiogram 12/06/2019 showed an ejection fraction in the 60-65% range  (m) echo  08/12/2021 shows an ejection fraction in the 55-60% range.   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  (a) brain irradiation10/19/17-11/08/17: Whole brain/ 35 Gy in 14 fractions   (b) repeat brain MRI obtained 12/10/2016 shows no active disease in the brain  (c) repeat brain MRI 03/02/2017 shows no evidence of residual or recurrent disease  (d) repeat brain MRI 07/29/2017 shows no evidence of residual or recurrent disease  (e) repeat brain MRI 12/07/2017 shows no evidence of disease recurrence.  There is progressive white matter change secondary to prior treatment.  (f) repeat brain MRI 04/07/2018 showed no evidence of disease_0  (g) repeat brain MRI on 08/23/2018 shows no evidence of disease  (h) repeat brain MRI on 01/25/2019 shows no evidence of disease  (I) brain MRI 07/06/2019 shows no evidence of active disease  (j) brain MRI 01/11/2020 shows a subdural hematoma measuring 0.8 cm but no evidence of recurrent metastatic disease  (k) brain MRI 05/21/2020 no evidence of recurrent disease; chronic changes c/w prior treatment and recent subdural (otherwise resolved)  (l) brain MRI 01/02/2021 shows no evidence of recurrent disease.  There is some dural thickening in the area of prior subdural hematoma and continued follow-up is recommended.  (13) peripheral disease monitoring:  (A) chest CT scan, brain CT and bone scan 01/31/2020 show no evidence of active disease  (B) CT chest with contrast 06/10/2020 showed no evidence of actice/progressive disease (C) MRI total spine 01/03/2021 shows no enhancing bony mets, prior sclerotic lesions not well visualized  (D) CT of the chest 01/20/2021 stable, with unchanged bony metastasis, scattered subcentimeter low-attenuation liver lesions too small to characterize, and no measurable disease.  (E) PET scan 08/20/2021 shows no progression or active disease   PLAN: Adahlia has no clinical or radiographic sign of progression of her metastatic breast cancer.  She continues on anastrozole and Herceptin with good tolerance.  She will  continue these treatments for her metastatic breast cancer.  Her appointment schedule slightly off today, so we made adjustments to it.  She will return in 20 days instead of 21 for her next Herceptin because she is going out of town on the 23rd to see her son for the holidays.  We discussed Dr. Virgie Dad retirement and she has an upcoming appointment with Dr.Iruku.  She understands the above and is agreeable with the plan.  MRI knows to call for any questions or concerns between now and her next appointment we are always happy to see her sooner if needed.  Total encounter time 20 minutes.* in face to face visit time, chart review, lab review, order entry, care coordination, and documentation of the encounter.  Wilber Bihari, NP 10/31/21 12:37 PM Medical Oncology and Hematology [facility] [place], [place] [address] Tel. [phone]    Fax. [phone]   *Total Encounter Time as defined by the Centers for Medicare and Medicaid Services includes, in addition to the face-to-face time of a [name] visit (documented in the note above) non-face-to-face time: obtaining and reviewing outside history, ordering and reviewing medications, tests or procedures, care coordination (communications with other health care professionals or caregivers) and documentation  in the medical record.

## 2021-11-07 ENCOUNTER — Telehealth: Payer: Self-pay | Admitting: Oncology

## 2021-11-07 NOTE — Telephone Encounter (Signed)
Sch per 12/2 los, left msg

## 2021-11-12 ENCOUNTER — Ambulatory Visit: Payer: Medicaid Other

## 2021-11-12 ENCOUNTER — Other Ambulatory Visit: Payer: Medicaid Other

## 2021-11-12 ENCOUNTER — Ambulatory Visit: Payer: Medicaid Other | Admitting: Oncology

## 2021-11-17 ENCOUNTER — Other Ambulatory Visit: Payer: Self-pay | Admitting: Family Medicine

## 2021-11-17 ENCOUNTER — Ambulatory Visit: Payer: Self-pay | Admitting: *Deleted

## 2021-11-17 DIAGNOSIS — K5909 Other constipation: Secondary | ICD-10-CM

## 2021-11-17 NOTE — Telephone Encounter (Signed)
Pt called saying she has hurt her hip and lower back.  She does not know how it came about.  Its been hurting a couple of days.  Tylenol is not helping.  She would like to see someone asap.   CB#  [phone]  Reason for Disposition  [1] SEVERE back pain (e.g., excruciating) AND [2] sudden onset AND [3] age > 60 years  Answer Assessment - Initial Assessment Questions 1. ONSET: "When did the pain begin?"      5 days ago 2. LOCATION: "Where does it hurt?" (upper, mid or lower back)     Lower back and hips into inner thigh- left side 3. SEVERITY: "How bad is the pain?"  (e.g., Scale 1-10; mild, moderate, or severe)   - MILD (1-3): doesn't interfere with normal activities    - MODERATE (4-7): interferes with normal activities or awakens from sleep    - SEVERE (8-10): excruciating pain, unable to do any normal activities      Moderate/severe 4. PATTERN: "Is the pain constant?" (e.g., yes, no; constant, intermittent)      Constant 5. RADIATION: "Does the pain shoot into your legs or elsewhere?"     Radiates into thigh 6. CAUSE:  "What do you think is causing the back pain?"      unsure 7. BACK OVERUSE:  "Any recent lifting of heavy objects, strenuous work or exercise?"     no 8. MEDICATIONS: "What have you taken so far for the pain?" (e.g., nothing, acetaminophen, NSAIDS)     Naproxen, tylenol 9. NEUROLOGIC SYMPTOMS: "Do you have any weakness, numbness, or problems with bowel/bladder control?"     no 10. OTHER SYMPTOMS: "Do you have any other symptoms?" (e.g., fever, abdominal pain, burning with urination, blood in urine)       no 11. PREGNANCY: "Is there any chance you are pregnant?" (e.g., yes, no; LMP)       na  Protocols used: Back Pain-A-AH

## 2021-11-17 NOTE — Telephone Encounter (Signed)
°  Chief Complaint: back pain/hip pain Symptoms: lower back pain, hip pain, pain in inner thigh Frequency: 5 days Pertinent Negatives: Patient denies fever, numbness, weakness Disposition: [] ED /[] Urgent Care (no appt availability in office) / [] Appointment(In office/virtual)/ []  [facility]/ [] Home Care/ [x] Refused Recommended Disposition  Additional Notes: Patient is requesting medication- she states she does not have transportation to UC/ED for at least 2 days. Patient is leaving town the end of the week. Wants call back from office- appointment

## 2021-11-19 ENCOUNTER — Other Ambulatory Visit: Payer: Self-pay

## 2021-11-19 DIAGNOSIS — C770 Secondary and unspecified malignant neoplasm of lymph nodes of head, face and neck: Secondary | ICD-10-CM

## 2021-11-19 DIAGNOSIS — C7951 Secondary malignant neoplasm of bone: Secondary | ICD-10-CM

## 2021-11-19 DIAGNOSIS — D696 Thrombocytopenia, unspecified: Secondary | ICD-10-CM

## 2021-11-19 DIAGNOSIS — C50312 Malignant neoplasm of lower-inner quadrant of left female breast: Secondary | ICD-10-CM

## 2021-11-19 DIAGNOSIS — C7931 Secondary malignant neoplasm of brain: Secondary | ICD-10-CM

## 2021-11-19 DIAGNOSIS — C50119 Malignant neoplasm of central portion of unspecified female breast: Secondary | ICD-10-CM

## 2021-11-20 ENCOUNTER — Other Ambulatory Visit: Payer: Self-pay

## 2021-11-20 ENCOUNTER — Inpatient Hospital Stay: Payer: Medicaid Other

## 2021-11-20 VITALS — BP 118/76 | HR 83 | Temp 98.0°F | Resp 18

## 2021-11-20 DIAGNOSIS — D696 Thrombocytopenia, unspecified: Secondary | ICD-10-CM

## 2021-11-20 DIAGNOSIS — Z95828 Presence of other vascular implants and grafts: Secondary | ICD-10-CM

## 2021-11-20 DIAGNOSIS — C770 Secondary and unspecified malignant neoplasm of lymph nodes of head, face and neck: Secondary | ICD-10-CM

## 2021-11-20 DIAGNOSIS — C7951 Secondary malignant neoplasm of bone: Secondary | ICD-10-CM

## 2021-11-20 DIAGNOSIS — C7931 Secondary malignant neoplasm of brain: Secondary | ICD-10-CM

## 2021-11-20 DIAGNOSIS — Z5112 Encounter for antineoplastic immunotherapy: Secondary | ICD-10-CM | POA: Diagnosis not present

## 2021-11-20 DIAGNOSIS — C50119 Malignant neoplasm of central portion of unspecified female breast: Secondary | ICD-10-CM

## 2021-11-20 DIAGNOSIS — C50312 Malignant neoplasm of lower-inner quadrant of left female breast: Secondary | ICD-10-CM

## 2021-11-20 LAB — CBC WITH DIFFERENTIAL (CANCER CENTER ONLY)
Abs Immature Granulocytes: 0.02 10*3/uL (ref 0.00–0.07)
Basophils Absolute: 0 10*3/uL (ref 0.0–0.1)
Basophils Relative: 1 %
Eosinophils Absolute: 0 10*3/uL (ref 0.0–0.5)
Eosinophils Relative: 0 %
HCT: 34.2 % — ABNORMAL LOW (ref 36.0–46.0)
Hemoglobin: 11 g/dL — ABNORMAL LOW (ref 12.0–15.0)
Immature Granulocytes: 1 %
Lymphocytes Relative: 20 %
Lymphs Abs: 0.8 10*3/uL (ref 0.7–4.0)
MCH: 29.5 pg (ref 26.0–34.0)
MCHC: 32.2 g/dL (ref 30.0–36.0)
MCV: 91.7 fL (ref 80.0–100.0)
Monocytes Absolute: 0.3 10*3/uL (ref 0.1–1.0)
Monocytes Relative: 8 %
Neutro Abs: 2.9 10*3/uL (ref 1.7–7.7)
Neutrophils Relative %: 70 %
Platelet Count: 114 10*3/uL — ABNORMAL LOW (ref 150–400)
RBC: 3.73 MIL/uL — ABNORMAL LOW (ref 3.87–5.11)
RDW: 13.2 % (ref 11.5–15.5)
WBC Count: 4.1 10*3/uL (ref 4.0–10.5)
nRBC: 0 % (ref 0.0–0.2)

## 2021-11-20 LAB — CMP (CANCER CENTER ONLY)
ALT: 16 U/L (ref 0–44)
AST: 24 U/L (ref 15–41)
Albumin: 4.3 g/dL (ref 3.5–5.0)
Alkaline Phosphatase: 69 U/L (ref 38–126)
Anion gap: 8 (ref 5–15)
BUN: 12 mg/dL (ref 6–20)
CO2: 32 mmol/L (ref 22–32)
Calcium: 10.8 mg/dL — ABNORMAL HIGH (ref 8.9–10.3)
Chloride: 101 mmol/L (ref 98–111)
Creatinine: 1.24 mg/dL — ABNORMAL HIGH (ref 0.44–1.00)
GFR, Estimated: 50 mL/min — ABNORMAL LOW (ref >60)
Glucose, Bld: 91 mg/dL (ref 70–99)
Potassium: 3.8 mmol/L (ref 3.5–5.1)
Sodium: 141 mmol/L (ref 135–145)
Total Bilirubin: 0.4 mg/dL (ref 0.3–1.2)
Total Protein: 6.8 g/dL (ref 6.5–8.1)

## 2021-11-20 MED ORDER — SODIUM CHLORIDE 0.9% FLUSH
10.0000 mL | INTRAVENOUS | Status: DC | PRN
  Administered 2021-11-20: 14:00:00 10 mL

## 2021-11-20 MED ORDER — SODIUM CHLORIDE 0.9 % IV SOLN: Freq: Once | INTRAVENOUS | Status: AC

## 2021-11-20 MED ORDER — ACETAMINOPHEN 325 MG PO TABS
650.0000 mg | ORAL_TABLET | Freq: Once | ORAL | Status: AC
  Administered 2021-11-20: 12:00:00 650 mg via ORAL
  Filled 2021-11-20: qty 2

## 2021-11-20 MED ORDER — HEPARIN SOD (PORK) LOCK FLUSH 100 UNIT/ML IV SOLN
500.0000 [IU] | Freq: Once | INTRAVENOUS | Status: AC | PRN
  Administered 2021-11-20: 14:00:00 500 [IU]

## 2021-11-20 MED ORDER — SODIUM CHLORIDE 0.9% FLUSH
10.0000 mL | INTRAVENOUS | Status: DC | PRN
  Administered 2021-11-20: 12:00:00 10 mL via INTRAVENOUS

## 2021-11-20 MED ORDER — TRASTUZUMAB-DKST CHEMO 150 MG IV SOLR
6.0000 mg/kg | Freq: Once | INTRAVENOUS | Status: AC
  Administered 2021-11-20: 13:00:00 399 mg via INTRAVENOUS
  Filled 2021-11-20: qty 19

## 2021-11-20 NOTE — Patient Instructions (Signed)
[facility]  Discharge Instructions: Thank you for choosing [facility] to provide your oncology and hematology care.   If you have a lab appointment with the Cancer Center, please go directly to the Cancer Center and check in at the registration area.   Wear comfortable clothing and clothing appropriate for easy access to any Portacath or PICC line.   We strive to give you quality time with your provider. You may need to reschedule your appointment if you arrive late (15 or more minutes).  Arriving late affects you and other patients whose appointments are after yours.  Also, if you miss three or more appointments without notifying the office, you may be dismissed from the clinic at the provider's discretion.      For prescription refill requests, have your pharmacy contact our office and allow 72 hours for refills to be completed.    Today you received the following chemotherapy and/or immunotherapy agents trastuzumab      To help prevent nausea and vomiting after your treatment, we encourage you to take your nausea medication as directed.  BELOW ARE SYMPTOMS THAT SHOULD BE REPORTED IMMEDIATELY: *FEVER GREATER THAN 100.4 F (38 C) OR HIGHER *CHILLS OR SWEATING *NAUSEA AND VOMITING THAT IS NOT CONTROLLED WITH YOUR NAUSEA MEDICATION *UNUSUAL SHORTNESS OF BREATH *UNUSUAL BRUISING OR BLEEDING *URINARY PROBLEMS (pain or burning when urinating, or frequent urination) *BOWEL PROBLEMS (unusual diarrhea, constipation, pain near the anus) TENDERNESS IN MOUTH AND THROAT WITH OR WITHOUT PRESENCE OF ULCERS (sore throat, sores in mouth, or a toothache) UNUSUAL RASH, SWELLING OR PAIN  UNUSUAL VAGINAL DISCHARGE OR ITCHING   Items with * indicate a potential emergency and should be followed up as soon as possible or go to the Emergency Department if any problems should occur.  Please show the CHEMOTHERAPY ALERT CARD or IMMUNOTHERAPY ALERT CARD at check-in to  the Emergency Department and triage nurse.  Should you have questions after your visit or need to cancel or reschedule your appointment, please contact [facility]  Dept: [phone]  and follow the prompts.  Office hours are 8:00 a.m. to 4:30 p.m. Monday - Friday. Please note that voicemails left after 4:00 p.m. may not be returned until the following business day.  We are closed weekends and major holidays. You have access to a nurse at all times for urgent questions. Please call the main number to the clinic Dept: [phone] and follow the prompts.   For any non-urgent questions, you may also contact your provider using MyChart. We now offer e-Visits for anyone 18 and older to request care online for non-urgent symptoms. For details visit [email or web address].   Also download the MyChart app! Go to the app store, search "MyChart", open the app, select , and log in with your MyChart username and password.  Due to Covid, a mask is required upon entering the hospital/clinic. If you do not have a mask, one will be given to you upon arrival. For doctor visits, patients may have 1 support person aged 18 or older with them. For treatment visits, patients cannot have anyone with them due to current Covid guidelines and our immunocompromised population.   

## 2021-11-25 ENCOUNTER — Telehealth: Payer: Self-pay

## 2021-11-25 NOTE — Telephone Encounter (Signed)
Left message for Mrs. Piekarski that she does not need to come on 12/03/2021 and she is scheduled for 12/11/2021. Gardiner Rhyme, RN

## 2021-11-25 NOTE — Telephone Encounter (Signed)
-----   Message from Rolland Bimler, RN sent at 11/25/2021  4:49 PM EST ----- Regarding: FW: schedule Hello, Unfortunately, did not get to this on Monday. Would appreciate follow up. Thank you, Sandi  ----- Message ----- From: Lavinia Sharps, [facility] Sent: 11/25/2021   1:15 PM EST To: Rolland Bimler, RN Subject: schedule                                       Hi   Pt last received trastuzuamb on 12/22 so would not be due again until 12/11/21.  She is scheduled for 12/03/21.  This appt may need to be pushed back a week.  Thanks [employer]

## 2021-12-03 ENCOUNTER — Other Ambulatory Visit: Payer: Medicaid Other

## 2021-12-03 ENCOUNTER — Ambulatory Visit: Payer: Medicaid Other

## 2021-12-04 ENCOUNTER — Encounter: Payer: Self-pay | Admitting: Oncology

## 2021-12-05 ENCOUNTER — Telehealth: Payer: Medicaid Other | Admitting: Physician Assistant

## 2021-12-05 ENCOUNTER — Ambulatory Visit: Payer: Self-pay

## 2021-12-05 DIAGNOSIS — J028 Acute pharyngitis due to other specified organisms: Secondary | ICD-10-CM | POA: Diagnosis not present

## 2021-12-05 DIAGNOSIS — B9689 Other specified bacterial agents as the cause of diseases classified elsewhere: Secondary | ICD-10-CM

## 2021-12-05 MED ORDER — AMOXICILLIN 500 MG PO CAPS
500.0000 mg | ORAL_CAPSULE | Freq: Two times a day (BID) | ORAL | 0 refills | Status: AC
Start: 2021-12-05 — End: 2021-12-15

## 2021-12-05 MED ORDER — LIDOCAINE VISCOUS HCL 2 % MT SOLN
OROMUCOSAL | 0 refills | Status: AC
Start: 2021-12-05 — End: ?

## 2021-12-05 NOTE — Patient Instructions (Signed)
[name], thank you for joining Mar Daring, PA-C for today's virtual visit.  While this provider is not your primary care provider (PCP), if your PCP is located in our provider database this encounter information will be shared with them immediately following your visit.  Consent: (Patient) [name] provided verbal consent for this virtual visit at the beginning of the encounter.  Current Medications:  Current Outpatient Medications:    amoxicillin (AMOXIL) 500 MG capsule, Take 1 capsule (500 mg total) by mouth 2 (two) times daily for 10 days., Disp: 20 capsule, Rfl: 0   lidocaine (XYLOCAINE) 2 % solution, 34mL swish and swallow every 4 hours as needed for sore throat, Disp: 100 mL, Rfl: 0   acetaminophen (TYLENOL) 500 MG tablet, Take 1 tablet (500 mg total) by mouth 2 (two) times daily as needed. Take with naproxen 250 mg tablet (Patient taking differently: Take 500 mg by mouth 2 (two) times daily. Take with naproxen 250 mg tablet), Disp: 180 tablet, Rfl: 4   AMITIZA 8 MCG capsule, TAKE 1 CAPSULE (8 MCG TOTAL) BY MOUTH 2 (TWO) TIMES DAILY WITH A MEAL., Disp: 60 capsule, Rfl: 1   anastrozole (ARIMIDEX) 1 MG tablet, Take 1 tablet (1 mg total) by mouth daily., Disp: 90 tablet, Rfl: 4   azelastine (ASTELIN) 0.1 % nasal spray, Place 2 sprays into both nostrils daily. Use in each nostril as directed (Patient taking differently: Place 1 spray into both nostrils daily. Use in each nostril as directed), Disp: 30 mL, Rfl: 12   carvedilol (COREG) 3.125 MG tablet, TAKE 1 TABLET (3.125 MG TOTAL) BY MOUTH 2 TIMES DAILY FOR 10 DAYS, THEN 2 TABLETS 2 TIMES DAILY. (Patient not taking: Reported on 10/01/2021), Disp: 120 tablet, Rfl: 3   cholecalciferol (VITAMIN D3) 25 MCG (1000 UNIT) tablet, Take 2 tablets (2,000 Units total) by mouth daily. (Patient taking differently: Take 5,000 Units by mouth daily.), Disp: 90 tablet, Rfl: 4   cromolyn (OPTICROM) 4 % ophthalmic solution, Place 1 drop into both  eyes daily., Disp: , Rfl:    docusate sodium (COLACE) 100 MG capsule, TAKE 1 CAPSULE BY MOUTH ONCE A DAY, Disp: 100 capsule, Rfl: 6   fluticasone (FLONASE) 50 MCG/ACT nasal spray, SPRAY 2 SPRAYS INTO EACH NOSTRIL EVERY DAY, Disp: 16 mL, Rfl: 2   gabapentin (NEURONTIN) 300 MG capsule, Take 300-600 mg by mouth See admin instructions. Take 300 mg in the morning and 600 mg at bedtime, Disp: , Rfl:    lamoTRIgine (LAMICTAL) 100 MG tablet, Take 100 mg by mouth every morning., Disp: , Rfl:    loratadine (CLARITIN) 10 MG tablet, TAKE 1 TABLET BY MOUTH EVERY DAY, Disp: 90 tablet, Rfl: 3   Lurasidone HCl 60 MG TABS, Take 60 mg by mouth daily., Disp: , Rfl:    naproxen (NAPROSYN) 250 MG tablet, Take 1 tablet (250 mg total) by mouth 2 (two) times daily as needed. Take together with tylenol 500 mg tablet (Patient taking differently: Take 250 mg by mouth 2 (two) times daily. Take together with tylenol 500 mg tablet), Disp: 180 tablet, Rfl: 4   ondansetron (ZOFRAN) 8 MG tablet, TAKE 1 TABLET BY MOUTH EVERY 8 HOURS AS NEEDED FOR NAUSEA AND VOMITING, Disp: 90 tablet, Rfl: 1   pantoprazole (PROTONIX) 40 MG tablet, TAKE 1 TABLET BY MOUTH EVERY DAY, Disp: 90 tablet, Rfl: 3   polyethylene glycol (MIRALAX / GLYCOLAX) 17 g packet, Take 17 g by mouth daily as needed for mild constipation. (Patient taking differently:  Take 17 g by mouth every 3 (three) days.), Disp: 30 each, Rfl: 6   rosuvastatin (CRESTOR) 10 MG tablet, TAKE 1 TABLET BY MOUTH EVERY DAY, Disp: 90 tablet, Rfl: 3   traZODone (DESYREL) 100 MG tablet, Take 1 tablet (100 mg total) by mouth at bedtime. (Patient taking differently: Take 200-300 mg by mouth at bedtime.), Disp: 30 tablet, Rfl: 0   vitamin C (ASCORBIC ACID) 500 MG tablet, Take 500 mg by mouth daily., Disp: , Rfl:    Medications ordered in this encounter:  Meds ordered this encounter  Medications   amoxicillin (AMOXIL) 500 MG capsule    Sig: Take 1 capsule (500 mg total) by mouth 2 (two) times daily  for 10 days.    Dispense:  20 capsule    Refill:  0    Order Specific Question:   Supervising Provider    Answer:   MILLER, BRIAN [[id number]]   lidocaine (XYLOCAINE) 2 % solution    Sig: 55mL swish and swallow every 4 hours as needed for sore throat    Dispense:  100 mL    Refill:  0    Order Specific Question:   Supervising Provider    Answer:   Noemi Chapel [[id number]]     *If you need refills on other medications prior to your next appointment, please contact your pharmacy*  Follow-Up: Call back or seek an in-person evaluation if the symptoms worsen or if the condition fails to improve as anticipated.  Other Instructions Strep Throat, Adult Strep throat is an infection of the throat. It is caused by germs (bacteria). Strep throat is common during the cold months of the year. It mostly affects children who are 88-80 years old. However, people of all ages can get it at any time of the year. This infection spreads from person to person through coughing, sneezing, or having close contact. What are the causes? This condition is caused by the Streptococcus pyogenes germ. What increases the risk? You care for young children. Children are more likely to get strep throat and may spread it to others. You go to crowded places. Germs can spread easily in such places. You kiss or touch someone who has strep throat. What are the signs or symptoms? Fever or chills. Redness, swelling, or pain in the tonsils or throat. Pain or trouble when swallowing. White or yellow spots on the tonsils or throat. Tender glands in the neck and under the jaw. Bad breath. Red rash all over the body. This is rare. How is this treated? Medicines that kill germs (antibiotics). Medicines that treat pain or fever. These include: Ibuprofen or acetaminophen. Aspirin, only for people who are over the age of 40. Cough drops. Throat sprays. Follow these instructions at home: Medicines  Take over-the-counter and prescription  medicines only as told by your doctor. Take your antibiotic medicine as told by your doctor. Do not stop taking the antibiotic even if you start to feel better. Eating and drinking  If you have trouble swallowing, eat soft foods until your throat feels better. Drink enough fluid to keep your pee (urine) pale yellow. To help with pain, you may have: Warm fluids, such as soup and tea. Cold fluids, such as frozen desserts or popsicles. General instructions Rinse your mouth (gargle) with a salt-water mixture 3-4 times a day or as needed. To make a salt-water mixture, dissolve -1 tsp (3-6 g) of salt in 1 cup (237 mL) of warm water. Rest as much as you can. Stay  home from work or school until you have been taking antibiotics for 24 hours. Do not smoke or use any products that contain nicotine or tobacco. If you need help quitting, ask your doctor. Keep all follow-up visits. How is this prevented?  Do not share food, drinking cups, or personal items. They can cause the germs to spread. Wash your hands well with soap and water. Make sure that all people in your house wash their hands well. Have family members tested if they have a fever or a sore throat. They may need an antibiotic if they have strep throat. Contact a doctor if: You have swelling in your neck that keeps getting bigger. You get a rash, cough, or earache. You cough up a thick fluid that is green, yellow-brown, or bloody. You have pain that does not get better with medicine. Your symptoms get worse instead of getting better. You have a fever. Get help right away if: You vomit. You have a very bad headache. Your neck hurts or feels stiff. You have chest pain or are short of breath. You have drooling, very bad throat pain, or changes in your voice. Your neck is swollen, or the skin gets red and tender. Your mouth is dry, or you are peeing less than normal. You keep feeling more tired or have trouble waking up. Your joints are  red or painful. These symptoms may be an emergency. Do not wait to see if the symptoms will go away. Get help right away. Call your local emergency services (911 in the [place] Summary Strep throat is an infection of the throat. It is caused by germs (bacteria). This infection can spread from person to person through coughing, sneezing, or having close contact. Take your medicines, including antibiotics, as told by your doctor. Do not stop taking the antibiotic even if you start to feel better. To prevent the spread of germs, wash your hands well with soap and water. Have others do the same. Do not share food, drinking cups, or personal items. Get help right away if you have a bad headache, chest pain, shortness of breath, a stiff or painful neck, or you vomit. This information is not intended to replace advice given to you by your health care provider. Make sure you discuss any questions you have with your health care provider. Document Revised: 03/11/2021 Document Reviewed: 03/11/2021 Elsevier Patient Education  2022 [employer].    If you have been instructed to have an in-person evaluation today at a local Urgent Care facility, please use the link below. It will take you to a list of all of our available [facility], including address, phone number and hours of operation. Please do not delay care.  [facility]  If you or a family member do not have a primary care provider, use the link below to schedule a visit and establish care. When you choose a [facility] primary care physician or advanced practice provider, you gain a long-term partner in health. Find a Primary Care Provider  Learn more about 's in-office and virtual care options: [facility]

## 2021-12-05 NOTE — Telephone Encounter (Signed)
Appt scheduled today- virtual/ telephone.  Schedule via [facility], Nurse Mabe. Although patient MyChart status is active,  states she is unable to operate her MyChart.

## 2021-12-05 NOTE — Telephone Encounter (Signed)
°  Chief Complaint: Sore throat Symptoms: Sore throat, runny nose, mild cough, COVID home test negative Frequency: Started Tuesday Pertinent Negatives: Patient denies fever Disposition: [] ED /[] Urgent Care (no appt availability in office) / [] Appointment(In office/virtual)/ []  [facility]/ [] Home Care/ [] Refused Recommended Disposition /[] [facility]/ []  Follow-up with PCP Additional Notes: Pt. Requests a virtual visit today. Please advise pt.      Answer Assessment - Initial Assessment Questions 1. ONSET: "When did the throat start hurting?" (Hours or days ago)      Tuesday 2. SEVERITY: "How bad is the sore throat?" (Scale 1-10; mild, moderate or severe)   - MILD (1-3):  doesn't interfere with eating or normal activities   - MODERATE (4-7): interferes with eating some solids and normal activities   - SEVERE (8-10):  excruciating pain, interferes with most normal activities   - SEVERE DYSPHAGIA: can't swallow liquids, drooling     Severe 3. STREP EXPOSURE: "Has there been any exposure to strep within the past week?" If Yes, ask: "What type of contact occurred?"      No 4.  VIRAL SYMPTOMS: "Are there any symptoms of a cold, such as a runny nose, cough, hoarse voice or red eyes?"      Runny nose, mild cough 5. FEVER: "Do you have a fever?" If Yes, ask: "What is your temperature, how was it measured, and when did it start?"     No 6. PUS ON THE TONSILS: "Is there pus on the tonsils in the back of your throat?"     No 7. OTHER SYMPTOMS: "Do you have any other symptoms?" (e.g., difficulty breathing, headache, rash)     No 8. PREGNANCY: "Is there any chance you are pregnant?" "When was your last menstrual period?"     No  Protocols used: Sore Throat-A-AH

## 2021-12-05 NOTE — Progress Notes (Signed)
Virtual Visit Consent   [name], you are scheduled for a virtual visit with a [facility] provider today.     Just as with appointments in the office, your consent must be obtained to participate.  Your consent will be active for this visit and any virtual visit you may have with one of our providers in the next 365 days.     If you have a MyChart account, a copy of this consent can be sent to you electronically.  All virtual visits are billed to your insurance company just like a traditional visit in the office.    As this is a virtual visit, video technology does not allow for your provider to perform a traditional examination.  This may limit your provider's ability to fully assess your condition.  If your provider identifies any concerns that need to be evaluated in person or the need to arrange testing (such as labs, EKG, etc.), we will make arrangements to do so.     Although advances in technology are sophisticated, we cannot ensure that it will always work on either your end or our end.  If the connection with a video visit is poor, the visit may have to be switched to a telephone visit.  With either a video or telephone visit, we are not always able to ensure that we have a secure connection.     I need to obtain your verbal consent now.   Are you willing to proceed with your visit today?    Latarra Eagleton has provided verbal consent on 12/05/2021 for a virtual visit (video or telephone).   Mar Daring, PA-C   Date: 12/05/2021 5:52 PM   Virtual Visit via Video Note   I, Mar Daring, connected with  [name]  ([id number], 1961-04-05) on 12/05/21 at  5:45 PM EST by a video-enabled telemedicine application and verified that I am speaking with the correct person using two identifiers.  Location: Patient: Virtual Visit Location Patient: Home Provider: Virtual Visit Location Provider: Home Office   I discussed the limitations of evaluation and management by  telemedicine and the availability of in person appointments. The patient expressed understanding and agreed to proceed.    History of Present Illness: [name] is a 61 y.o. who identifies as a female who was assigned female at birth, and is being seen today for sore throat.  HPI: Sore Throat  This is a new problem. The current episode started in the past 7 days (tuesday). The problem has been gradually worsening. There has been no fever. Associated symptoms include congestion, coughing, ear pain (other day; now improved), headaches, swollen glands (right) and trouble swallowing. Pertinent negatives include no ear discharge, hoarse voice or shortness of breath. Associated symptoms comments: General malaise. She has had no exposure to strep or mono. She has tried acetaminophen (alka seltzer cold and congestion) for the symptoms. The treatment provided no relief.   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   S/P mastectomy, left 12/09/2018   S/P breast reconstruction, left 12/09/2018   S/P radiation therapy 12/09/2018  GERD (gastroesophageal reflux disease) 12/10/2017   Edema 11/09/2017   Sensorineural hearing loss (SNHL) of both ears 11/05/2017   Vitamin D deficiency 01/18/2017   Bipolar I disorder, most recent episode depressed ([place]) 12/08/2016   Adjustment disorder with anxiety 12/06/2016   History of cancer metastatic to brain 07/27/2016   Iron deficiency anemia 06/26/2016   Bone metastases ([place]) 06/03/2016   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[place]) 06/01/2016   Tobacco abuse 07/22/2015   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/22/2015   Malignant neoplasm of female breast ([place]) 07/01/2015   Current smoker 03/28/2015   Goals of care, counseling/discussion 03/28/2015   Seasonal allergies 03/28/2015   Anxiety state 02/28/2015   Fibromyalgia 02/28/2015   Family history of diabetes mellitus 02/28/2015   H/O alcohol abuse     Allergies:  Allergies  Allergen Reactions   Demerol [Meperidine Hcl] Itching and Nausea And Vomiting    INTOLERANCE >  N & V   Erythromycin Rash   Medications:  Current Outpatient Medications:    amoxicillin (AMOXIL) 500 MG capsule, Take 1 capsule (500 mg total) by mouth 2 (two) times daily for 10 days., Disp: 20 capsule, Rfl: 0   lidocaine (XYLOCAINE) 2 % solution, 47mL swish and swallow every 4 hours as needed for sore throat, Disp: 100 mL, Rfl: 0   acetaminophen (TYLENOL) 500 MG tablet, Take 1 tablet (500 mg total) by mouth 2 (two) times daily as needed. Take with naproxen 250 mg tablet (Patient taking differently: Take 500 mg by mouth 2 (two) times daily. Take with naproxen 250 mg tablet), Disp: 180 tablet, Rfl: 4   AMITIZA 8 MCG capsule, TAKE 1 CAPSULE (8 MCG TOTAL) BY MOUTH 2 (TWO) TIMES DAILY WITH A MEAL., Disp: 60 capsule, Rfl: 1   anastrozole (ARIMIDEX) 1 MG tablet, Take 1 tablet (1 mg total) by mouth daily., Disp: 90 tablet, Rfl: 4   azelastine (ASTELIN) 0.1 % nasal spray, Place 2 sprays into both nostrils daily. Use in each nostril as directed (Patient taking differently: Place 1 spray into both nostrils daily. Use in each nostril as directed), Disp: 30 mL, Rfl: 12   carvedilol (COREG) 3.125 MG tablet, TAKE 1 TABLET (3.125 MG TOTAL) BY MOUTH 2 TIMES DAILY FOR 10 DAYS, THEN 2 TABLETS 2 TIMES DAILY. (Patient not taking: Reported on 10/01/2021), Disp: 120 tablet, Rfl: 3   cholecalciferol (VITAMIN D3) 25 MCG (1000 UNIT) tablet, Take 2 tablets (2,000 Units total) by mouth daily. (Patient taking differently:  Take 5,000 Units by mouth daily.), Disp: 90 tablet, Rfl: 4   cromolyn (OPTICROM) 4 % ophthalmic solution, Place 1 drop into both eyes daily., Disp: , Rfl:    docusate sodium (COLACE) 100 MG capsule, TAKE 1 CAPSULE BY MOUTH ONCE A DAY, Disp: 100 capsule, Rfl: 6   fluticasone (FLONASE) 50 MCG/ACT nasal spray, SPRAY 2 SPRAYS INTO EACH NOSTRIL EVERY DAY, Disp: 16 mL, Rfl: 2   gabapentin (NEURONTIN) 300 MG capsule, Take 300-600 mg by mouth See admin instructions. Take 300 mg in the morning and 600 mg at bedtime, Disp: , Rfl:    lamoTRIgine (LAMICTAL) 100 MG tablet, Take 100 mg by mouth every morning., Disp: , Rfl:    loratadine (CLARITIN) 10 MG tablet, TAKE 1 TABLET BY MOUTH EVERY DAY, Disp: 90 tablet, Rfl: 3   Lurasidone HCl 60 MG TABS, Take 60 mg by mouth daily., Disp: , Rfl:    naproxen (NAPROSYN) 250 MG tablet, Take 1 tablet (250 mg total) by  mouth 2 (two) times daily as needed. Take together with tylenol 500 mg tablet (Patient taking differently: Take 250 mg by mouth 2 (two) times daily. Take together with tylenol 500 mg tablet), Disp: 180 tablet, Rfl: 4   ondansetron (ZOFRAN) 8 MG tablet, TAKE 1 TABLET BY MOUTH EVERY 8 HOURS AS NEEDED FOR NAUSEA AND VOMITING, Disp: 90 tablet, Rfl: 1   pantoprazole (PROTONIX) 40 MG tablet, TAKE 1 TABLET BY MOUTH EVERY DAY, Disp: 90 tablet, Rfl: 3   polyethylene glycol (MIRALAX / GLYCOLAX) 17 g packet, Take 17 g by mouth daily as needed for mild constipation. (Patient taking differently: Take 17 g by mouth every 3 (three) days.), Disp: 30 each, Rfl: 6   rosuvastatin (CRESTOR) 10 MG tablet, TAKE 1 TABLET BY MOUTH EVERY DAY, Disp: 90 tablet, Rfl: 3   traZODone (DESYREL) 100 MG tablet, Take 1 tablet (100 mg total) by mouth at bedtime. (Patient taking differently: Take 200-300 mg by mouth at bedtime.), Disp: 30 tablet, Rfl: 0   vitamin C (ASCORBIC ACID) 500 MG tablet, Take 500 mg by mouth daily., Disp: , Rfl:   Observations/Objective: Patient is well-developed,  well-nourished in no acute distress.  Resting comfortably at home.  Head is normocephalic, atraumatic.  No labored breathing.  Speech is clear and coherent with logical content.  Patient is alert and oriented at baseline.    Assessment and Plan: 1. Acute bacterial pharyngitis - amoxicillin (AMOXIL) 500 MG capsule; Take 1 capsule (500 mg total) by mouth 2 (two) times daily for 10 days.  Dispense: 20 capsule; Refill: 0 - lidocaine (XYLOCAINE) 2 % solution; 81mL swish and swallow every 4 hours as needed for sore throat  Dispense: 100 mL; Refill: 0  - Suspect strep throat or other bacterial pharyngitis - Amoxicillin prescribed - Viscous lidocaine for sore throat - Salt water gargles and warm liquids as needed - Push fluids - Rest - Seek in person evaluation if not improving or if symptoms worsen  Follow Up Instructions: I discussed the assessment and treatment plan with the patient. The patient was provided an opportunity to ask questions and all were answered. The patient agreed with the plan and demonstrated an understanding of the instructions.  A copy of instructions were sent to the patient via MyChart unless otherwise noted below.    The patient was advised to call back or seek an in-person evaluation if the symptoms worsen or if the condition fails to improve as anticipated.  Time:  I spent 12 minutes with the patient via telehealth technology discussing the above problems/concerns.    Mar Daring, PA-C

## 2021-12-05 NOTE — Telephone Encounter (Signed)
Pt called back, she was upset that no one had responded back to her about setting up a virtual appt. Advised pt that office has been busy today and seeing patients and apologized for not reaching out. Offered pt a virtual UC visit and she refused stated she wanted to speak with Dr Margarita Rana now and she wasn't going to UC or the ED since she reached out this morning. Called and spoke with Carilyn Goodpasture, RN who was conference in and she advised pt that there was no availability in the office or virtual until next week. Pt states she isn't waiting til next week. I advised her again the only option would be to set her up with an virtual UC visit. Scheduled her for 1745 and put in for them to call her rather than VV d/t pt reports VV doesn't work. Advised pt to call back if she doesn't receive a phone call around that time pt verbalized understanding.

## 2021-12-10 ENCOUNTER — Telehealth: Payer: Self-pay | Admitting: *Deleted

## 2021-12-10 NOTE — Telephone Encounter (Signed)
Patient had virtual apt on 12/05/2021.

## 2021-12-10 NOTE — Telephone Encounter (Signed)
Copied from [facility] [id number]. Topic: Appointment Scheduling - Scheduling Inquiry for Clinic >> Dec 05, 2021 11:11 AM Tessa Lerner A wrote: Reason for CRM: The patient would like to be contacted by a member of staff when possible about a previously discussed virtual visit   The agent was unable to successfully schedule the visit at the time of call  Please contact further when available >> Dec 05, 2021  3:04 PM Tessa Lerner A wrote: The patient has made an additional call regarding this matter  Please contact further when possible

## 2021-12-11 ENCOUNTER — Encounter: Payer: Self-pay | Admitting: Hematology and Oncology

## 2021-12-11 ENCOUNTER — Inpatient Hospital Stay (HOSPITAL_BASED_OUTPATIENT_CLINIC_OR_DEPARTMENT_OTHER): Payer: Medicaid Other | Admitting: Hematology and Oncology

## 2021-12-11 ENCOUNTER — Inpatient Hospital Stay: Payer: Medicaid Other

## 2021-12-11 ENCOUNTER — Other Ambulatory Visit: Payer: Self-pay | Admitting: *Deleted

## 2021-12-11 ENCOUNTER — Other Ambulatory Visit: Payer: Self-pay

## 2021-12-11 ENCOUNTER — Inpatient Hospital Stay: Payer: Medicaid Other | Attending: Medical

## 2021-12-11 VITALS — BP 128/71 | HR 100 | Temp 97.7°F | Resp 18 | Ht 65.0 in | Wt 150.6 lb

## 2021-12-11 DIAGNOSIS — C50119 Malignant neoplasm of central portion of unspecified female breast: Secondary | ICD-10-CM

## 2021-12-11 DIAGNOSIS — Z95828 Presence of other vascular implants and grafts: Secondary | ICD-10-CM

## 2021-12-11 DIAGNOSIS — C7931 Secondary malignant neoplasm of brain: Secondary | ICD-10-CM | POA: Insufficient documentation

## 2021-12-11 DIAGNOSIS — C7951 Secondary malignant neoplasm of bone: Secondary | ICD-10-CM | POA: Insufficient documentation

## 2021-12-11 DIAGNOSIS — D696 Thrombocytopenia, unspecified: Secondary | ICD-10-CM

## 2021-12-11 DIAGNOSIS — Z79899 Other long term (current) drug therapy: Secondary | ICD-10-CM | POA: Insufficient documentation

## 2021-12-11 DIAGNOSIS — C770 Secondary and unspecified malignant neoplasm of lymph nodes of head, face and neck: Secondary | ICD-10-CM

## 2021-12-11 DIAGNOSIS — Z8589 Personal history of malignant neoplasm of other organs and systems: Secondary | ICD-10-CM

## 2021-12-11 DIAGNOSIS — C50011 Malignant neoplasm of nipple and areola, right female breast: Secondary | ICD-10-CM

## 2021-12-11 DIAGNOSIS — C50312 Malignant neoplasm of lower-inner quadrant of left female breast: Secondary | ICD-10-CM | POA: Insufficient documentation

## 2021-12-11 DIAGNOSIS — Z5112 Encounter for antineoplastic immunotherapy: Secondary | ICD-10-CM | POA: Diagnosis present

## 2021-12-11 LAB — CBC WITH DIFFERENTIAL (CANCER CENTER ONLY)
Abs Immature Granulocytes: 0.1 10*3/uL — ABNORMAL HIGH (ref 0.00–0.07)
Basophils Absolute: 0 10*3/uL (ref 0.0–0.1)
Basophils Relative: 1 %
Eosinophils Absolute: 0 10*3/uL (ref 0.0–0.5)
Eosinophils Relative: 0 %
HCT: 33.1 % — ABNORMAL LOW (ref 36.0–46.0)
Hemoglobin: 10.8 g/dL — ABNORMAL LOW (ref 12.0–15.0)
Immature Granulocytes: 2 %
Lymphocytes Relative: 19 %
Lymphs Abs: 1.1 10*3/uL (ref 0.7–4.0)
MCH: 30.1 pg (ref 26.0–34.0)
MCHC: 32.6 g/dL (ref 30.0–36.0)
MCV: 92.2 fL (ref 80.0–100.0)
Monocytes Absolute: 0.5 10*3/uL (ref 0.1–1.0)
Monocytes Relative: 9 %
Neutro Abs: 4.1 10*3/uL (ref 1.7–7.7)
Neutrophils Relative %: 69 %
Platelet Count: 171 10*3/uL (ref 150–400)
RBC: 3.59 MIL/uL — ABNORMAL LOW (ref 3.87–5.11)
RDW: 13.1 % (ref 11.5–15.5)
WBC Count: 5.9 10*3/uL (ref 4.0–10.5)
nRBC: 0 % (ref 0.0–0.2)

## 2021-12-11 LAB — CMP (CANCER CENTER ONLY)
ALT: 16 U/L (ref 0–44)
AST: 23 U/L (ref 15–41)
Albumin: 4 g/dL (ref 3.5–5.0)
Alkaline Phosphatase: 81 U/L (ref 38–126)
Anion gap: 7 (ref 5–15)
BUN: 12 mg/dL (ref 6–20)
CO2: 34 mmol/L — ABNORMAL HIGH (ref 22–32)
Calcium: 10.8 mg/dL — ABNORMAL HIGH (ref 8.9–10.3)
Chloride: 100 mmol/L (ref 98–111)
Creatinine: 1.17 mg/dL — ABNORMAL HIGH (ref 0.44–1.00)
GFR, Estimated: 53 mL/min — ABNORMAL LOW (ref >60)
Glucose, Bld: 92 mg/dL (ref 70–99)
Potassium: 3.8 mmol/L (ref 3.5–5.1)
Sodium: 141 mmol/L (ref 135–145)
Total Bilirubin: 0.3 mg/dL (ref 0.3–1.2)
Total Protein: 6.6 g/dL (ref 6.5–8.1)

## 2021-12-11 MED ORDER — SODIUM CHLORIDE 0.9 % IV SOLN: Freq: Once | INTRAVENOUS | Status: AC

## 2021-12-11 MED ORDER — SODIUM CHLORIDE 0.9% FLUSH
10.0000 mL | INTRAVENOUS | Status: DC | PRN
  Administered 2021-12-11: 10 mL

## 2021-12-11 MED ORDER — HEPARIN SOD (PORK) LOCK FLUSH 100 UNIT/ML IV SOLN
500.0000 [IU] | Freq: Once | INTRAVENOUS | Status: AC | PRN
  Administered 2021-12-11: 500 [IU]

## 2021-12-11 MED ORDER — SODIUM CHLORIDE 0.9% FLUSH
10.0000 mL | INTRAVENOUS | Status: AC | PRN
  Administered 2021-12-11: 10 mL

## 2021-12-11 MED ORDER — TRASTUZUMAB-DKST CHEMO 150 MG IV SOLR
6.0000 mg/kg | Freq: Once | INTRAVENOUS | Status: AC
  Administered 2021-12-11: 399 mg via INTRAVENOUS
  Filled 2021-12-11: qty 19

## 2021-12-11 MED ORDER — ACETAMINOPHEN 325 MG PO TABS
650.0000 mg | ORAL_TABLET | Freq: Once | ORAL | Status: AC
  Administered 2021-12-11: 650 mg via ORAL
  Filled 2021-12-11: qty 2

## 2021-12-11 NOTE — Progress Notes (Signed)
[place]  [phone] Fax:(336) [phone]     ID: [name] DOB: Apr 17, 1961  MR#: [id number]  XYV#:[id number]  Patient Care Team: Charlott Rakes, MD as PCP - General (Family Medicine) Larey Dresser, MD as PCP - Advanced Heart Failure (Cardiology) Kyung Rudd, MD as Consulting Physician (Radiation Oncology) Erline Levine, MD as Consulting Physician (Neurosurgery) Corena Pilgrim, MD as Consulting Physician (Psychiatry) Mickeal Skinner Acey Lav, MD as Consulting Physician (Psychiatry) Nehemiah Settle, MD as Referring Physician (Plastic Surgery) Irene Limbo, MD as Consulting Physician (Plastic Surgery) Dillingham, Loel Lofty, DO as Attending Physician (Plastic Surgery) Merdis Delay, DMD (Dentistry) Benay Pike, MD as Consulting Physician (Hematology and Oncology)   CHIEF COMPLAINT: Metastatic triple positive breast cancer  CURRENT TREATMENT: trastuzumab [name] 21 days], [denosumab/Xgeva (Q6W)], anastrozole   INTERVAL HISTORY: [name] returns today for follow up and treatment of her metastatic triple positive breast cancer.  [name] continues on Herceptin and anastrozole for management of metastatic triple positive breast cancer.  She had imaging in September 2022 which showed no FDG avid metastatic disease.  Since last visit, she continues to do the same.  She has some left upper extremity lymphedema and has some sleeve that needs to be fitted, patient has to call and make that appointment.  She denies any new changes in her breathing, bowel habits or urinary habits.  She has some baseline urinary urgency and incontinence.  No new headaches or neurological symptoms.  She has a follow-up with her Dr. Mickeal Skinner next year with repeat MRI.  She denies any new bone pains.  No side effects from Herceptin on Arimidex and she remains compliant.  She does have some more dental work to be finished next week apparently and hence we will hold Xgeva at this  time.  She is continuing with scans every 6 months or sooner if there are any concerns.  Rest of the pertinent 10 point ROS reviewed and negative.   COVID 19 VACCINATION STATUS: Status post Pfizer x2 with booster September 2061   BREAST CANCER HISTORY: From the original intake note:  [name] was aware of a "lemon sized lump in" her left axilla for about a year before bringing it to medical attention. By then she had developed left breast and left axillary swelling (June 2016). She presented to the local emergency room and had a chest CT scan 06/06/2015 which showed a nodule in the left breast measuring 0.9 cm and questionable left axillary adenopathy. She then proceeded to bilateral diagnostic mammography and left breast ultrasonography 06/19/2015. There were no prior films for comparison (last mammography 12 years prior).. The breast density was category C. Mammography showed in the left breast upper inner quadrant a 7 cm area including a small mass and significant pleomorphic calcifications. Ultrasonography defined the mass as measuring 1.2 cm. The left axilla appeared unremarkable. There was significant skin edema.  Biopsy of the left breast mass 06/19/2015 showed (SP [id number]) an invasive ductal carcinoma, grade 2, estrogen receptor 83% positive, progesterone receptor 26% positive, and HER-2 amplified by immunohistochemstry with a 3+ reading. The patient had biopsies of a separate area in the left breast August of the same year and this showed atypical ductal hyperplasia. (SP [id number]).  Accordingly after appropriate discussion on 08/21/2015 the patient proceeded to left mastectomy with left axillary sentinel lymph node sampling, which, since the lymph nodes were positive, extended to the procedure to left axillary lymph node dissection. The pathology (SP [id number]) showed an invasive ductal carcinoma, grade 3,  measuring in excess of 9 cm. There were also skin satellites, not contiguous with the  invasive carcinoma. Margins were clear and ample. There was evidence of lymphovascular invasion. A total of 15 lymph nodes were removed, including 5 sentinel lymph nodes, all of which were positive, so that the final total was 14 out of 15 lymph nodes involved by tumor. There was evidence of extranodal extension. The final pathology was pT4b pN2a, stage IIIB  CA-27-29 and CEA 09/19/2015 were non-informative October 2016.  Unfortunately CT scans of the chest abdomen and pelvis 09/16/2015 showed bony metastases to the ri/ght scapula, left iliac crest, and also L4 and T-spine. There were questionable liver cysts which on repeat CT scan 03/02/2016 appear to be a little bit more well-defined, possibly a little larger. There were also some possible right upper lobe lung lesions.  Adjuvant treatment consisted of docetaxel, trastuzumab and pertuzumab, with the final (6th) docetaxel dose given 02/11/2016. She continues on trastuzumab and pertuzumab, with the 11th cycle given 05/05/2016. Echocardiogram 02/26/2016 showed an ejection fraction of 55%. She receives denosumab/Xgeva every 4 weeks.. She also receives radiation, started 06/09/201, to be completed 06/26/2016.  Her subsequent history is as detailed below   PAST MEDICAL HISTORY: Past Medical History:  Diagnosis Date   Alcohol abuse    none since 2013   Anemia    during chemo   Anxiety    At age 15   Arthritis Dx 2010   Bipolar disorder ([place])    Breast cancer ([facility])    Bronchitis    Cancer ([place])    breast mets to brain   CHF (congestive heart failure) ([place])    Chronic pain    resolved per patient 1/61/09   Complication of anesthesia    Depression    Family history of adverse reaction to anesthesia    MOther had PONV   GERD (gastroesophageal reflux disease)    Headache    hx  migraines   HLD (hyperlipidemia)    Hypertension    Lymphedema of left arm    Opiate dependence (HCC)    PONV (postoperative nausea and vomiting)     Port-A-Cath in place    PTSD (post-traumatic stress disorder)    S/P endometrial ablation    in md's office    PAST SURGICAL HISTORY: Past Surgical History:  Procedure Laterality Date   APPLICATION OF CRANIAL NAVIGATION N/A 08/14/2016   Procedure: APPLICATION OF CRANIAL NAVIGATION;  Surgeon: Erline Levine, MD;  Location: [facility];  Service: Neurosurgery;  Laterality: N/A;   BREAST RECONSTRUCTION Left    with silicone implant   COLONOSCOPY W/ POLYPECTOMY     CRANIOTOMY N/A 08/14/2016   Procedure: CRANIOTOMY TUMOR EXCISION WITH Lucky Rathke;  Surgeon: Erline Levine, MD;  Location: [facility];  Service: Neurosurgery;  Laterality: N/A;   FIBULA FRACTURE SURGERY Left    MASTECTOMY Left    RADIOLOGY WITH ANESTHESIA N/A 07/23/2016   Procedure: MRI OF BRAIN WITH AND WITHOUT;  Surgeon: Medication Radiologist, MD;  Location: [facility];  Service: Radiology;  Laterality: N/A;   RADIOLOGY WITH ANESTHESIA N/A 09/08/2016   Procedure: MRI OF BRAIN WITH AND WITHOUT CONTRAST;  Surgeon: Medication Radiologist, MD;  Location: [facility];  Service: Radiology;  Laterality: N/A;   RADIOLOGY WITH ANESTHESIA N/A 12/10/2016   Procedure: MRI OF BRAIN WITH AND WITHOUT;  Surgeon: Medication Radiologist, MD;  Location: [facility];  Service: Radiology;  Laterality: N/A;   RADIOLOGY WITH ANESTHESIA N/A 03/02/2017   Procedure: MRI of BRAIN  W and W/OUT CONTRAST;  Surgeon: Medication Radiologist, MD;  Location: [facility];  Service: Radiology;  Laterality: N/A;   RADIOLOGY WITH ANESTHESIA N/A 07/29/2017   Procedure: RADIOLOGY WITH ANESTHESIA MRI OF BRAIN WITH AND WITHOUT CONTRAST;  Surgeon: Radiologist, Medication, MD;  Location: [facility];  Service: Radiology;  Laterality: N/A;   RADIOLOGY WITH ANESTHESIA N/A 12/07/2017   Procedure: MRI WITH ANESTHESIA OF BRAIN WITH AND WITHOUT CONTRAST;  Surgeon: Radiologist, Medication, MD;  Location: [facility];  Service: Radiology;  Laterality: N/A;   RADIOLOGY WITH ANESTHESIA N/A 04/07/2018   Procedure: MRI OF BRAIN  WITH AND WITHOUT CONTRAST;  Surgeon: Radiologist, Medication, MD;  Location: [facility];  Service: Radiology;  Laterality: N/A;   RADIOLOGY WITH ANESTHESIA N/A 08/23/2018   Procedure: MRI WITH ANESTHESIA OF THE BRAIN WITH AND WITHOUT;  Surgeon: Radiologist, Medication, MD;  Location: [facility];  Service: Radiology;  Laterality: N/A;   RADIOLOGY WITH ANESTHESIA N/A 01/24/2019   Procedure: MRI OF BRAIN WITH AND WITHOUT CONTRAST;  Surgeon: Radiologist, Medication, MD;  Location: [facility];  Service: Radiology;  Laterality: N/A;   RADIOLOGY WITH ANESTHESIA N/A 07/06/2019   Procedure: MRI WITH ANESTHESIA OF BRAIN WITH AND WITHOUT CONTRAST;  Surgeon: Radiologist, Medication, MD;  Location: [facility];  Service: Radiology;  Laterality: N/A;   RADIOLOGY WITH ANESTHESIA N/A 01/11/2020   Procedure: MRI WITH ANESTHESIA BRAIN WITH AND WITHOUT;  Surgeon: Radiologist, Medication, MD;  Location: [facility];  Service: Radiology;  Laterality: N/A;   RADIOLOGY WITH ANESTHESIA N/A 05/21/2020   Procedure: MRI WITH ANESTHESIA OF BRAIN WITH AND WITHOUT CONTRAST;  Surgeon: Radiologist, Medication, MD;  Location: [facility];  Service: Radiology;  Laterality: N/A;   RADIOLOGY WITH ANESTHESIA N/A 01/02/2021   Procedure: MRI WITH ANESTHESIA  BRAIN WITH AND WITHOUT CONTRAST,TOTAL SPINE MET SCREENING;  Surgeon: Radiologist, Medication, MD;  Location: [facility];  Service: Radiology;  Laterality: N/A;   RADIOLOGY WITH ANESTHESIA N/A 10/07/2021   Procedure: MRI BRAIN WITH AND WITHOUT CONTRAST; MRI TOTAL SPINE;  METS SCREENING WITH ANESTHESIA;  Surgeon: Radiologist, Medication, MD;  Location: [facility];  Service: Radiology;  Laterality: N/A;   right power port placement Right     FAMILY HISTORY Family History  Problem Relation Age of Onset   Diabetes Mother    Bipolar disorder Mother    CAD Father    Breast cancer Neg Hx   The patient's father still living, age 43, in [place]. He had prostate cancer at some point in the past. The patient's mother died at age 25  from complications of diabetes. The patient had no brothers, 2 sisters. A paternal grandmother had lung cancer in the setting of tobacco abuse. There is no other history of cancer in the family to her knowledge   GYNECOLOGIC HISTORY:  No LMP recorded. Patient has had an ablation. Menarche approximately age 3. First live birth in 85. The patient is GX P2. She underwent endometrial ablation in 2016.   SOCIAL HISTORY: (Updated April 2022) The patient is originally from [place]. She moved to this area from [place] because it was less expensive here she says.   She now has has her own 1 bedroom apartment on [address].  She is divorced. Her 2 children are [name] who lives in [place] and works as a Development worker, community, and Pataha who lives in [place] and works as Freight forwarder of a healthcare facility.  [name] has 2 children, Interior and spatial designer and Grayce Sessions.  He married in 2022, his wife Minette Brine, has 2 children of her own  plus a step grandson).  [name] has no children.    ADVANCED DIRECTIVES: Not in place; at the 06/03/2016 visit the patient was given the appropriate forms to complete and notarize at her discretion   HEALTH MAINTENANCE: Social History   Tobacco Use   Smoking status: Former    Packs/day: 1.00    Years: 43.00    Pack years: 43.00    Types: Cigarettes   Smokeless tobacco: Never   Tobacco comments:    3 day since last cigarette  Vaping Use   Vaping Use: Some days  Substance Use Topics   Alcohol use: No    Comment: no ETOH since 08/22/12   Drug use: No    Comment: states she's in recovery program for 7 years     Colonoscopy:  PAP:  Bone density:   Allergies  Allergen Reactions   Demerol [Meperidine Hcl] Itching and Nausea And Vomiting    INTOLERANCE >  N & V   Erythromycin Rash    Current Outpatient Medications on File Prior to Visit  Medication Sig Dispense Refill   acetaminophen (TYLENOL) 500 MG tablet Take 1 tablet (500 mg total) by mouth 2 (two) times daily as needed.  Take with naproxen 250 mg tablet (Patient taking differently: Take 500 mg by mouth 2 (two) times daily. Take with naproxen 250 mg tablet) 180 tablet 4   AMITIZA 8 MCG capsule TAKE 1 CAPSULE (8 MCG TOTAL) BY MOUTH 2 (TWO) TIMES DAILY WITH A MEAL. 60 capsule 1   amoxicillin (AMOXIL) 500 MG capsule Take 1 capsule (500 mg total) by mouth 2 (two) times daily for 10 days. 20 capsule 0   anastrozole (ARIMIDEX) 1 MG tablet Take 1 tablet (1 mg total) by mouth daily. 90 tablet 4   azelastine (ASTELIN) 0.1 % nasal spray Place 2 sprays into both nostrils daily. Use in each nostril as directed (Patient taking differently: Place 1 spray into both nostrils daily. Use in each nostril as directed) 30 mL 12   carvedilol (COREG) 3.125 MG tablet TAKE 1 TABLET (3.125 MG TOTAL) BY MOUTH 2 TIMES DAILY FOR 10 DAYS, THEN 2 TABLETS 2 TIMES DAILY. (Patient not taking: Reported on 10/01/2021) 120 tablet 3   cholecalciferol (VITAMIN D3) 25 MCG (1000 UNIT) tablet Take 2 tablets (2,000 Units total) by mouth daily. (Patient taking differently: Take 5,000 Units by mouth daily.) 90 tablet 4   cromolyn (OPTICROM) 4 % ophthalmic solution Place 1 drop into both eyes daily.     docusate sodium (COLACE) 100 MG capsule TAKE 1 CAPSULE BY MOUTH ONCE A DAY 100 capsule 6   fluticasone (FLONASE) 50 MCG/ACT nasal spray SPRAY 2 SPRAYS INTO EACH NOSTRIL EVERY DAY 16 mL 2   gabapentin (NEURONTIN) 300 MG capsule Take 300-600 mg by mouth See admin instructions. Take 300 mg in the morning and 600 mg at bedtime     lamoTRIgine (LAMICTAL) 100 MG tablet Take 100 mg by mouth every morning.     lidocaine (XYLOCAINE) 2 % solution 51m swish and swallow every 4 hours as needed for sore throat 100 mL 0   loratadine (CLARITIN) 10 MG tablet TAKE 1 TABLET BY MOUTH EVERY DAY 90 tablet 3   Lurasidone HCl 60 MG TABS Take 60 mg by mouth daily.     naproxen (NAPROSYN) 250 MG tablet Take 1 tablet (250 mg total) by mouth 2 (two) times daily as needed. Take together with  tylenol 500 mg tablet (Patient taking differently: Take 250 mg by mouth  2 (two) times daily. Take together with tylenol 500 mg tablet) 180 tablet 4   ondansetron (ZOFRAN) 8 MG tablet TAKE 1 TABLET BY MOUTH EVERY 8 HOURS AS NEEDED FOR NAUSEA AND VOMITING 90 tablet 1   pantoprazole (PROTONIX) 40 MG tablet TAKE 1 TABLET BY MOUTH EVERY DAY 90 tablet 3   polyethylene glycol (MIRALAX / GLYCOLAX) 17 g packet Take 17 g by mouth daily as needed for mild constipation. (Patient taking differently: Take 17 g by mouth every 3 (three) days.) 30 each 6   rosuvastatin (CRESTOR) 10 MG tablet TAKE 1 TABLET BY MOUTH EVERY DAY 90 tablet 3   traZODone (DESYREL) 100 MG tablet Take 1 tablet (100 mg total) by mouth at bedtime. (Patient taking differently: Take 200-300 mg by mouth at bedtime.) 30 tablet 0   vitamin C (ASCORBIC ACID) 500 MG tablet Take 500 mg by mouth daily.     No current facility-administered medications on file prior to visit.    OBJECTIVE: White woman in no acute distress  There were no vitals filed for this visit. There is no height or weight on file to calculate BMI.  ECOG FS: 2   GENERAL: Patient appears chronically ill, is in a wheelchair and is in no acute distress.  NODES:  No cervical, supraclavicular, or axillary lymphadenopathy palpated.  BREAST EXAM: Left breast postmastectomy, no concerns.  Right breast without any palpable breast masses. LUNGS: Some bronchial breathing scattered, no crackles or wheeze today.   HEART:  Regular rate and rhythm.  ABDOMEN:  Soft, nontender.  EXTREMITIES:  No peripheral edema.   SKIN:  Clear with no obvious rashes or skin changes. No nail dyscrasia. NEURO:  Nonfocal. Well oriented.  Appropriate affect.   LAB RESULTS: No results found for: LABCA2  CBC    Component Value Date/Time   WBC 4.1 11/20/2021 1158   WBC 3.8 (L) 03/27/2021 0941   RBC 3.73 (L) 11/20/2021 1158   HGB 11.0 (L) 11/20/2021 1158   HGB 12.1 10/26/2017 1038   HCT 34.2 (L)  11/20/2021 1158   HCT 35.7 10/26/2017 1038   PLT 114 (L) 11/20/2021 1158   PLT 167 10/26/2017 1038   MCV 91.7 11/20/2021 1158   MCV 98.4 10/26/2017 1038   MCH 29.5 11/20/2021 1158   MCHC 32.2 11/20/2021 1158   RDW 13.2 11/20/2021 1158   RDW 13.3 10/26/2017 1038   LYMPHSABS 0.8 11/20/2021 1158   LYMPHSABS 0.5 (L) 10/26/2017 1038   MONOABS 0.3 11/20/2021 1158   MONOABS 0.1 10/26/2017 1038   EOSABS 0.0 11/20/2021 1158   EOSABS 0.0 10/26/2017 1038   BASOSABS 0.0 11/20/2021 1158   BASOSABS 0.0 10/26/2017 1038   CMP Latest Ref Rng & Units 11/20/2021 10/31/2021 10/03/2021  Glucose 70 - 99 mg/dL 91 101(H) 97  BUN 6 - 20 mg/dL _0 Creatinine 0.44 - 1.00 mg/dL 1.24(H) 1.01(H) 0.98  Sodium 135 - 145 mmol/L 141 140 141  Potassium 3.5 - 5.1 mmol/L 3.8 3.8 3.9  Chloride 98 - 111 mmol/L 101 101 103  CO2 22 - 32 mmol/L 32 29 29  Calcium 8.9 - 10.3 mg/dL 10.8(H) 10.2 9.2  Total Protein 6.5 - 8.1 g/dL 6.8 6.9 6.8  Total Bilirubin 0.3 - 1.2 mg/dL 0.4 0.3 0.3  Alkaline Phos 38 - 126 U/L 69 77 75  AST 15 - 41 U/L 24 33 29  ALT 0 - 44 U/L _1 STUDIES: No results found.   ELIGIBLE FOR AVAILABLE  RESEARCH PROTOCOL: no  ASSESSMENT: 61 y.o.  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  (2) status post left mastectomy and axillary lymph node dissection  with immediate expander placement 07/18/2015 for an mpT4 pN2,stage IIIB invasive ductal carcinoma, grade 3, with negative margins.  (a) definitive implant exchange to be scheduled in December   METASTATIC DISEASE: October 2016  (3) CT scan of the chest abdomen and pelvis  09/16/2015 shows metastatic lesions in the right scapula, left iliac crest, L4, and T spine. There were questionable liver cysts, with repeat CT scan 03/02/2016 showing possible  right upper lobe lung lesions and possibly increased liver lesions  (a) CT scan of the chest 06/17/2016 shows no active disease in the lungs or liver  (b) Bone scan July 2017 showed no evidence of bony metastatic disease   (c) head CT 07/08/2016 showed a cerebellar lesion, confirmed by MRI 07/23/2016, status post craniotomy 08/14/2016, confirming a metastatic deposit which was estrogen and progesterone receptor negative, HER-2 amplified with a signals ratio of 7.16, number per cell 13.25  (d) CA 27-29 is not informative  (4) received docetaxel every 3 weeks 6 together with trastuzumab and pertuzumab, last docetaxel dose 02/11/2016  (5) adjuvant radiation7/03/2016 to 06/26/2016 at [facility]: 1. The Left chest wall was treated to 23.4 Gy in 13 fractions at 1.8 Gy per fraction. 2. The Left chest wall was boosted to 10 Gy in 5 fractions at 2 Gy per fraction. 3. The Left Sclav/PAB was treated to 23.4 Gy in 13 fractions at 1.8 Gy per fraction.   [Note: Including the patient's treatment in [facility] (received 15 fractions per Dr. Maryan Rued near [place], [place]), the patient received 50.4 Gy to the left chest wall and supraclavicular region. ]  (6) started trastuzumab and pertuzumab October 2016, continuing every 3 weeks,  (a) echocardiogram 02/26/2016 showed a well preserved ejection fraction  (b) echocardiogram 07/01/2016 shows an ejection fraction in the 60-65%   (c) pertuzumab discontinued 10/2016 with uncontrolled diarrhea  (d) echocardiogram 11/11/2016 showed an ejection fraction in the 60-65%  (e) echocardiogram 03/03/2017 shows an ejection fraction of 60-65%  (f) echocardiogram on 05/19/2017 shows an ejection fraction of 55-60%  (g) echocardiogram 09/24/2017 shows the ejection fraction in the 60-65%  (h) echocardiogram 02/14/2018 shows an ejection fraction in the 60-65%  (I) echocardiogram  06/30/2018 shows an ejection fraction in the 55-60%  (m) echocardiogram on 12/08/2018 shows an ejection  fraction in the 60-65% range  (7) started denosumab/Xgeva October 2017 given every 4 weeks, transitioned to every 8 weeks beginning 10/11/18 (every 6 weeks while giving TDM1 every 3 weeks)  (a) Xgeva held after 08/21/2020 dose due to dental concerns  (8) started anastrozole October 2017   (a) bone scan 11/10/2016 shows no active disease  (b) chest CT scan 11/10/2016 stable, with no evidence of active disease  (c) chest CT and bone scan 07/02/2017 show no evidence of active disease  (d) CT scan of the chest with contrast 11/10/2017 shows some left axillary edema, but no evidence of thrombosis or adenopathy in that area, 0.9 cm precarinal lymph and 0.7 cm right upper lobe nodule node; bone lesions stable  (e) CT of the chest 05/04/2018 shows a 1.4 cm right lower paratracheal node which is slightly increased and a new right prevascular mediastinal node measuring 0.7 cm.  Bone  lesions are stable.  (f) chest CT on 07/01/2018 shows no definite findings of metastatic disease in the thorax. Previously noted borderline enlarged low right paratracheal lymph node is stable to slightly decreased in size   (e) chest CT on 12/30/2018 notes mild increase in right paratracheal adneopathy, recommended PET scan.  Pet scan on 01/19/2019 showed a hypermetabolic and enlarged right paratracheal lymph node consistent with breast cancer recurrence.  (f)Trastuzumab discontinued due to February 2020 scans   (g) TDM-1 started on 01/31/2019 given every 21 days.  (h) Chest CT 05/15/2019 shows decrease in mediastinal adenopathy  (i) CT chest on 08/17/2019 shows resolution of thoracic adenopathy  (j) TDM 1 discontinued after 10/11/2019 dose (8 months treatment) because of thrombocytopenia  (k) trastuzumab resumed 11/01/2019, repeated every 21 days  (l) echocardiogram 12/06/2019 showed an ejection fraction in the 60-65% range  (m) echo 08/12/2021 shows an ejection fraction in the 55-60% range.   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  (a) brain irradiation10/19/17-11/08/17: Whole brain/ 35 Gy in 14 fractions   (b) repeat brain MRI obtained 12/10/2016 shows no active disease in the brain  (c) repeat brain MRI 03/02/2017 shows no evidence of residual or recurrent disease  (d) repeat brain MRI 07/29/2017 shows no evidence of residual or recurrent disease  (e) repeat brain MRI 12/07/2017 shows no evidence of disease recurrence.  There is progressive white matter change secondary to prior treatment.  (f) repeat brain MRI 04/07/2018 showed no evidence of disease\  (g) repeat brain MRI on 08/23/2018 shows no evidence of disease  (h) repeat brain MRI on 01/25/2019 shows no evidence of disease  (I) brain MRI 07/06/2019 shows no evidence of active disease  (j) brain MRI 01/11/2020 shows a subdural hematoma measuring 0.8 cm but no evidence of recurrent metastatic disease  (k) brain MRI 05/21/2020 no evidence of recurrent disease; chronic changes c/w prior treatment and recent subdural (otherwise resolved)  (l) brain MRI 01/02/2021 shows no evidence of recurrent disease.  There is some dural thickening in the area of prior subdural hematoma and continued follow-up is recommended.  (13) peripheral disease monitoring:  (A) chest CT scan, brain CT and bone scan 01/31/2020 show no evidence of active disease  (B) CT chest with contrast 06/10/2020 showed no evidence of actice/progressive disease (C) MRI total spine 01/03/2021 shows no enhancing bony mets, prior sclerotic lesions not well visualized  (D) CT of the chest 01/20/2021 stable, with unchanged bony metastasis, scattered  subcentimeter low-attenuation liver lesions too small to characterize, and no measurable disease.  (E) PET scan 08/20/2021 shows no progression or active disease MRI brain in November 2022 showed stable postop changes from prior left cerebellar metastasis resection, no evidence of recurrent tumor at the site.  No new intracranial metastasis.  Persistent although decreased smooth dural thickening and enhancement overlying the right parietal lobe likely related to prior subdural hematoma at the site.    MR total spine med screening showed known bony metastasis within the T9 vertebral body with minimal peripheral enhancement unchanged from the prior total spine MRI of 01/02/2021 no MR evidence of new osseous metastasis.  Additional sclerotic lesion seen on prior  CT exams are not appreciable by MR modality.  No pathologic fracture or epidural tumor involvement.  Cervical thoracic and lumbar spondylosis as outlined and unchanged.  No more than mild appreciable spinal canal narrowing.   PLAN:  Ms. Denny has no clinical or radiographic sign of progression of her metastatic breast cancer.  She continues on anastrozole and Herceptin with good tolerance.  She will continue these treatments for her metastatic breast cancer. We will continue to hold Xgeva since she has some upcoming dental procedures.  We can resume Xgeva once all her dental procedures are complete. No concerns for cardiac compromise today.  We can proceed with echocardiogram evaluation every 6 months since she has been on Herceptin for more than 6 years. Will consider imaging every 6 months unless she has any clinical concerns for progression.  She was instructed to follow-up with Dr. Mickeal Skinner as scheduled. With regards to some congestion and some breathing issues, she was prescribed Augmentin for 10 days, she is currently on day 6 of day 10.  No major concerns for an active lung infection at this time.  Most likely this is upper respiratory tract  infection and resulting congestion.  She was saturating 100% on room air today for rest. She will return to clinic in approximately 8 weeks, will continue Herceptin infusion every 3 weeks, will continue Arimidex as prescribed.  Repeat imaging due in March 2023   Total encounter time 40 minutes.* in face to face visit time, chart review, lab review, order entry, care coordination, and documentation of the encounter.  *Total Encounter Time as defined by the Centers for Medicare and Medicaid Services includes, in addition to the face-to-face time of a patient visit (documented in the note above) non-face-to-face time: obtaining and reviewing outside history, ordering and reviewing medications, tests or procedures, care coordination (communications with other health care professionals or caregivers) and documentation in the medical record.

## 2021-12-11 NOTE — Progress Notes (Signed)
Per Dr. Chryl Heck - patient to have echocardiograms completed every 6 months. Last echo completed in September 2022.

## 2021-12-11 NOTE — Patient Instructions (Signed)
[facility]]   Discharge Instructions: Thank you for choosing [facility] to provide your oncology and hematology care.   If you have a lab appointment with the Cancer Center, please go directly to the Cancer Center and check in at the registration area.   Wear comfortable clothing and clothing appropriate for easy access to any Portacath or PICC line.   We strive to give you quality time with your provider. You may need to reschedule your appointment if you arrive late (15 or more minutes).  Arriving late affects you and other patients whose appointments are after yours.  Also, if you miss three or more appointments without notifying the office, you may be dismissed from the clinic at the provider's discretion.      For prescription refill requests, have your pharmacy contact our office and allow 72 hours for refills to be completed.    Today you received the following chemotherapy and/or immunotherapy agents: Trastuzumab (Herceptin)       To help prevent nausea and vomiting after your treatment, we encourage you to take your nausea medication as directed.  BELOW ARE SYMPTOMS THAT SHOULD BE REPORTED IMMEDIATELY: *FEVER GREATER THAN 100.4 F (38 C) OR HIGHER *CHILLS OR SWEATING *NAUSEA AND VOMITING THAT IS NOT CONTROLLED WITH YOUR NAUSEA MEDICATION *UNUSUAL SHORTNESS OF BREATH *UNUSUAL BRUISING OR BLEEDING *URINARY PROBLEMS (pain or burning when urinating, or frequent urination) *BOWEL PROBLEMS (unusual diarrhea, constipation, pain near the anus) TENDERNESS IN MOUTH AND THROAT WITH OR WITHOUT PRESENCE OF ULCERS (sore throat, sores in mouth, or a toothache) UNUSUAL RASH, SWELLING OR PAIN  UNUSUAL VAGINAL DISCHARGE OR ITCHING   Items with * indicate a potential emergency and should be followed up as soon as possible or go to the Emergency Department if any problems should occur.  Please show the CHEMOTHERAPY ALERT CARD or IMMUNOTHERAPY ALERT  CARD at check-in to the Emergency Department and triage nurse.  Should you have questions after your visit or need to cancel or reschedule your appointment, please contact [facility]  Dept: [phone]  and follow the prompts.  Office hours are 8:00 a.m. to 4:30 p.m. Monday - Friday. Please note that voicemails left after 4:00 p.m. may not be returned until the following business day.  We are closed weekends and major holidays. You have access to a nurse at all times for urgent questions. Please call the main number to the clinic Dept: [phone] and follow the prompts.   For any non-urgent questions, you may also contact your provider using MyChart. We now offer e-Visits for anyone 18 and older to request care online for non-urgent symptoms. For details visit [email or web address].   Also download the MyChart app! Go to the app store, search "MyChart", open the app, select [facility], and log in with your MyChart username and password.  Due to Covid, a mask is required upon entering the hospital/clinic. If you do not have a mask, one will be given to you upon arrival. For doctor visits, patients may have 1 support person aged 18 or older with them. For treatment visits, patients cannot have anyone with them due to current Covid guidelines and our immunocompromised population.  

## 2021-12-12 ENCOUNTER — Telehealth: Payer: Self-pay | Admitting: Hematology and Oncology

## 2021-12-12 NOTE — Telephone Encounter (Signed)
Scheduled appointment per 01/12 los. Patient aware.

## 2021-12-24 ENCOUNTER — Ambulatory Visit: Payer: Medicaid Other | Admitting: Hematology and Oncology

## 2021-12-24 ENCOUNTER — Ambulatory Visit: Payer: Medicaid Other

## 2021-12-24 ENCOUNTER — Other Ambulatory Visit: Payer: Medicaid Other

## 2021-12-30 ENCOUNTER — Other Ambulatory Visit: Payer: Self-pay

## 2021-12-30 ENCOUNTER — Encounter: Payer: Self-pay | Admitting: Nurse Practitioner

## 2021-12-30 ENCOUNTER — Ambulatory Visit: Payer: Self-pay

## 2021-12-30 ENCOUNTER — Ambulatory Visit: Payer: Medicaid Other | Attending: Nurse Practitioner | Admitting: Nurse Practitioner

## 2021-12-30 DIAGNOSIS — N760 Acute vaginitis: Secondary | ICD-10-CM

## 2021-12-30 MED ORDER — METRONIDAZOLE 500 MG PO TABS: 500.0000 mg | ORAL_TABLET | Freq: Two times a day (BID) | ORAL | 0 refills | Status: AC

## 2021-12-30 NOTE — Telephone Encounter (Signed)
Chief Complaint: Vaginal itching Symptoms: Odor Frequency: Onset 4 days ago Pertinent Negatives: Patient denies discharge, pain Disposition: [] ED /[] Urgent Care (no appt availability in office) / [x] Appointment(In office/virtual)/ []  [facility]/ [] Home Care/ [] Refused Recommended Disposition /[] [facility]/ []  Follow-up with PCP Additional Notes: N/A    Summary: Patient request medication for yeast infection   Pt returning call fu again [phone]   ----- Message from Jodie Echevaria sent at 12/30/2021  9:45 AM EST -----  Patient called in and requested an Rx for medication from Dr Margarita Rana for a yeast infection or vaginal infection of some kind. Did not state what the exact symptoms are. I informed patient that since her last visit was a while ago she may need to be seen before getting any treatment she went off asking what the Dr Seabron Spates do for that that she need to be seen for this issue. I offered the mobile clinic she told me just send a message. So please advise Ph# [phone]      Reason for Disposition  [1] Symptoms of a yeast infection (i.e., itchy, white discharge, not bad smelling) AND [2] not improved > 3 days following CARE ADVICE  Answer Assessment - Initial Assessment Questions 1. SYMPTOM: "What's the main symptom you're concerned about?" (e.g., pain, itching, dryness)     Itching 2. LOCATION: "Where is the itching located?" (e.g., inside/outside, left/right)     Inside 3. ONSET: "When did the itching start?"     4-5 days ago 4. PAIN: "Is there any pain?" If Yes, ask: "How bad is it?" (Scale: 1-10; mild, moderate, severe)     No 5. ITCHING: "Is there any itching?" If Yes, ask: "How bad is it?" (Scale: 1-10; mild, moderate, severe)     4 6. CAUSE: "What do you think is causing the discharge?" "Have you had the same problem before? What happened then?"     Yes, it was a yeast infection 7. OTHER SYMPTOMS: "Do you have any other symptoms?" (e.g.,  fever, itching, vaginal bleeding, pain with urination, injury to genital area, vaginal foreign body)    Odor 8. PREGNANCY: "Is there any chance you are pregnant?" "When was your last menstrual period?"     N/A  Protocols used: Vaginal Symptoms-A-AH

## 2021-12-30 NOTE — Progress Notes (Signed)
Virtual Visit via Telephone Note Due to national recommendations of social distancing due to [place] 19, telehealth visit is felt to be most appropriate for this patient at this time.  I discussed the limitations, risks, security and privacy concerns of performing an evaluation and management service by telephone and the availability of in person appointments. I also discussed with the patient that there may be a patient responsible charge related to this service. The patient expressed understanding and agreed to proceed.    I connected with [name] on 12/30/21  at   3:30 PM EST  EDT by telephone and verified that I am speaking with the correct person using two identifiers.  Location of Patient: Private Residence   Location of Provider: [facility] [employer] Office    Persons participating in Telemedicine visit: Geryl Rankins FNP-BC [name]    History of Present Illness: Telemedicine visit for: Vaginitis  She has a past medical history of Alcohol abuse, Anemia, Anxiety, Arthritis (Dx 2010), Bipolar disorder, Breast cancer, Bronchitis, CHF, Chronic pain, Depression,  GERD, HLD, Hypertension, Lymphedema of left arm, Opiate dependence, PONV,  Port-A-Cath in place, PTSD, and S/P endometrial ablation.    Vaginitis: Patient complains of an abnormal vaginal discharge for several days. Vaginal symptoms include discharge described as white and malodorous, local irritation, and odor. Vulvar symptoms include local irritation.STI Risk: Very low risk of STD exposure. Denies flank pain, pelvic pressure, hematuria or dysuria.      Past Medical History:  Diagnosis Date   Alcohol abuse    none since 2013   Anemia    during chemo   Anxiety    At age 67   Arthritis Dx 2010   Bipolar disorder ([place])    Breast cancer ([facility] [place])    Bronchitis    Cancer ([place])    breast mets to brain   CHF (congestive heart failure) ([place])    Chronic pain    resolved per patient 02/26/91    Complication of anesthesia    Depression    Family history of adverse reaction to anesthesia    MOther had PONV   GERD (gastroesophageal reflux disease)    Headache    hx  migraines   HLD (hyperlipidemia)    Hypertension    Lymphedema of left arm    Opiate dependence ([facility])    PONV (postoperative nausea and vomiting)    Port-A-Cath in place    PTSD (post-traumatic stress disorder)    S/P endometrial ablation    in md's office    Past Surgical History:  Procedure Laterality Date   APPLICATION OF CRANIAL NAVIGATION N/A 08/14/2016   Procedure: APPLICATION OF CRANIAL NAVIGATION;  Surgeon: Erline Levine, MD;  Location: [facility];  Service: Neurosurgery;  Laterality: N/A;   BREAST RECONSTRUCTION Left    with silicone implant   COLONOSCOPY W/ POLYPECTOMY     CRANIOTOMY N/A 08/14/2016   Procedure: CRANIOTOMY TUMOR EXCISION WITH Lucky Rathke;  Surgeon: Erline Levine, MD;  Location: [facility];  Service: Neurosurgery;  Laterality: N/A;   FIBULA FRACTURE SURGERY Left    MASTECTOMY Left    RADIOLOGY WITH ANESTHESIA N/A 07/23/2016   Procedure: MRI OF BRAIN WITH AND WITHOUT;  Surgeon: Medication Radiologist, MD;  Location: [facility];  Service: Radiology;  Laterality: N/A;   RADIOLOGY WITH ANESTHESIA N/A 09/08/2016   Procedure: MRI OF BRAIN WITH AND WITHOUT CONTRAST;  Surgeon: Medication Radiologist, MD;  Location: [facility];  Service: Radiology;  Laterality: N/A;   RADIOLOGY WITH ANESTHESIA  N/A 12/10/2016   Procedure: MRI OF BRAIN WITH AND WITHOUT;  Surgeon: Medication Radiologist, MD;  Location: [facility];  Service: Radiology;  Laterality: N/A;   RADIOLOGY WITH ANESTHESIA N/A 03/02/2017   Procedure: MRI of BRAIN W and W/OUT CONTRAST;  Surgeon: Medication Radiologist, MD;  Location: [facility];  Service: Radiology;  Laterality: N/A;   RADIOLOGY WITH ANESTHESIA N/A 07/29/2017   Procedure: RADIOLOGY WITH ANESTHESIA MRI OF BRAIN WITH AND WITHOUT CONTRAST;  Surgeon: Radiologist, Medication, MD;  Location: [facility];  Service:  Radiology;  Laterality: N/A;   RADIOLOGY WITH ANESTHESIA N/A 12/07/2017   Procedure: MRI WITH ANESTHESIA OF BRAIN WITH AND WITHOUT CONTRAST;  Surgeon: Radiologist, Medication, MD;  Location: [facility];  Service: Radiology;  Laterality: N/A;   RADIOLOGY WITH ANESTHESIA N/A 04/07/2018   Procedure: MRI OF BRAIN WITH AND WITHOUT CONTRAST;  Surgeon: Radiologist, Medication, MD;  Location: [facility];  Service: Radiology;  Laterality: N/A;   RADIOLOGY WITH ANESTHESIA N/A 08/23/2018   Procedure: MRI WITH ANESTHESIA OF THE BRAIN WITH AND WITHOUT;  Surgeon: Radiologist, Medication, MD;  Location: [facility];  Service: Radiology;  Laterality: N/A;   RADIOLOGY WITH ANESTHESIA N/A 01/24/2019   Procedure: MRI OF BRAIN WITH AND WITHOUT CONTRAST;  Surgeon: Radiologist, Medication, MD;  Location: [facility];  Service: Radiology;  Laterality: N/A;   RADIOLOGY WITH ANESTHESIA N/A 07/06/2019   Procedure: MRI WITH ANESTHESIA OF BRAIN WITH AND WITHOUT CONTRAST;  Surgeon: Radiologist, Medication, MD;  Location: [facility];  Service: Radiology;  Laterality: N/A;   RADIOLOGY WITH ANESTHESIA N/A 01/11/2020   Procedure: MRI WITH ANESTHESIA BRAIN WITH AND WITHOUT;  Surgeon: Radiologist, Medication, MD;  Location: [facility];  Service: Radiology;  Laterality: N/A;   RADIOLOGY WITH ANESTHESIA N/A 05/21/2020   Procedure: MRI WITH ANESTHESIA OF BRAIN WITH AND WITHOUT CONTRAST;  Surgeon: Radiologist, Medication, MD;  Location: [facility];  Service: Radiology;  Laterality: N/A;   RADIOLOGY WITH ANESTHESIA N/A 01/02/2021   Procedure: MRI WITH ANESTHESIA  BRAIN WITH AND WITHOUT CONTRAST,TOTAL SPINE MET SCREENING;  Surgeon: Radiologist, Medication, MD;  Location: [facility];  Service: Radiology;  Laterality: N/A;   RADIOLOGY WITH ANESTHESIA N/A 10/07/2021   Procedure: MRI BRAIN WITH AND WITHOUT CONTRAST; MRI TOTAL SPINE;  METS SCREENING WITH ANESTHESIA;  Surgeon: Radiologist, Medication, MD;  Location: [facility];  Service: Radiology;  Laterality: N/A;   right power port placement Right      Family History  Problem Relation Age of Onset   Diabetes Mother    Bipolar disorder Mother    CAD Father    Breast cancer Neg Hx     Social History   Socioeconomic History   Marital status: Single    Spouse name: Not on file   Number of children: Not on file   Years of education: Not on file   Highest education level: Not on file  Occupational History   Not on file  Tobacco Use   Smoking status: Former    Packs/day: 1.00    Years: 43.00    Pack years: 43.00    Types: Cigarettes   Smokeless tobacco: Never   Tobacco comments:    3 day since last cigarette  Vaping Use   Vaping Use: Some days  Substance and Sexual Activity   Alcohol use: No    Comment: no ETOH since 08/22/12   Drug use: No    Comment: states she's in recovery program for 7 years   Sexual activity: Never    Birth control/protection: Netta Corrigan  comments    Comment: ablation  Other Topics Concern   Not on file  Social History Narrative   Not on file   Social Determinants of Health   Financial Resource Strain: Not on file  Food Insecurity: Not on file  Transportation Needs: Not on file  Physical Activity: Not on file  Stress: Not on file  Social Connections: Not on file     Observations/Objective: Awake, alert and oriented x 3   Review of Systems  Constitutional:  Negative for fever.  Respiratory: Negative.    Cardiovascular: Negative.   Gastrointestinal:  Negative for abdominal pain and nausea.  Genitourinary: Negative.  Negative for dysuria, flank pain, frequency, hematuria and urgency.       SEE HPI  Musculoskeletal:  Negative for back pain.  Skin:  Positive for itching. Negative for rash.   Assessment and Plan: Diagnoses and all orders for this visit:  Acute vaginitis -     metroNIDAZOLE (FLAGYL) 500 MG tablet; Take 1 tablet (500 mg total) by mouth 2 (two) times daily for 7 days.     Follow Up Instructions Return if symptoms worsen or fail to improve.     I discussed the  assessment and treatment plan with the patient. The patient was provided an opportunity to ask questions and all were answered. The patient agreed with the plan and demonstrated an understanding of the instructions.   The patient was advised to call back or seek an in-person evaluation if the symptoms worsen or if the condition fails to improve as anticipated.  I provided 12 minutes of non-face-to-face time during this encounter including median intraservice time, reviewing previous notes, labs, imaging, medications and explaining diagnosis and management.  Gildardo Pounds, FNP-BC

## 2021-12-30 NOTE — Telephone Encounter (Signed)
Patient called in and requested an Rx for medication from Dr Margarita Rana for a yeast infection or vaginal infection of some kind. Did not state what the exact symptoms are. I informed patient that since her last visit was a while ago she may need to be seen before getting any treatment she went off asking what the Dr Seabron Spates do for that that she need to be seen for this issue. I offered the mobile clinic she told me just send a message. So please advise Ph# [phone]   Left message to call back about symptoms.

## 2022-01-01 ENCOUNTER — Inpatient Hospital Stay: Payer: Medicaid Other

## 2022-01-01 ENCOUNTER — Telehealth: Payer: Self-pay | Admitting: Hematology and Oncology

## 2022-01-01 ENCOUNTER — Other Ambulatory Visit: Payer: Self-pay | Admitting: Hematology and Oncology

## 2022-01-01 NOTE — Telephone Encounter (Signed)
Sch per 2/2 inbasket, left msg

## 2022-01-05 ENCOUNTER — Inpatient Hospital Stay: Payer: Medicaid Other

## 2022-01-07 ENCOUNTER — Inpatient Hospital Stay: Payer: Medicaid Other | Attending: Medical

## 2022-01-07 ENCOUNTER — Other Ambulatory Visit: Payer: Self-pay

## 2022-01-07 ENCOUNTER — Telehealth: Payer: Self-pay | Admitting: *Deleted

## 2022-01-07 DIAGNOSIS — C7931 Secondary malignant neoplasm of brain: Secondary | ICD-10-CM | POA: Insufficient documentation

## 2022-01-07 DIAGNOSIS — C7951 Secondary malignant neoplasm of bone: Secondary | ICD-10-CM | POA: Insufficient documentation

## 2022-01-07 DIAGNOSIS — Z5112 Encounter for antineoplastic immunotherapy: Secondary | ICD-10-CM | POA: Insufficient documentation

## 2022-01-07 DIAGNOSIS — C50119 Malignant neoplasm of central portion of unspecified female breast: Secondary | ICD-10-CM

## 2022-01-07 DIAGNOSIS — C7940 Secondary malignant neoplasm of unspecified part of nervous system: Secondary | ICD-10-CM | POA: Insufficient documentation

## 2022-01-07 DIAGNOSIS — C50312 Malignant neoplasm of lower-inner quadrant of left female breast: Secondary | ICD-10-CM | POA: Diagnosis present

## 2022-01-07 DIAGNOSIS — C770 Secondary and unspecified malignant neoplasm of lymph nodes of head, face and neck: Secondary | ICD-10-CM

## 2022-01-07 DIAGNOSIS — Z79899 Other long term (current) drug therapy: Secondary | ICD-10-CM | POA: Diagnosis not present

## 2022-01-07 DIAGNOSIS — Z170 Estrogen receptor positive status [ER+]: Secondary | ICD-10-CM | POA: Diagnosis not present

## 2022-01-07 DIAGNOSIS — D696 Thrombocytopenia, unspecified: Secondary | ICD-10-CM

## 2022-01-07 DIAGNOSIS — E222 Syndrome of inappropriate secretion of antidiuretic hormone: Secondary | ICD-10-CM

## 2022-01-07 LAB — CMP (CANCER CENTER ONLY)
ALT: 20 U/L (ref 0–44)
AST: 33 U/L (ref 15–41)
Albumin: 4.2 g/dL (ref 3.5–5.0)
Alkaline Phosphatase: 69 U/L (ref 38–126)
Anion gap: 5 (ref 5–15)
BUN: 14 mg/dL (ref 6–20)
CO2: 31 mmol/L (ref 22–32)
Calcium: 9.9 mg/dL (ref 8.9–10.3)
Chloride: 101 mmol/L (ref 98–111)
Creatinine: 1.04 mg/dL — ABNORMAL HIGH (ref 0.44–1.00)
GFR, Estimated: >60 mL/min (ref >60)
Glucose, Bld: 132 mg/dL — ABNORMAL HIGH (ref 70–99)
Potassium: 3.5 mmol/L (ref 3.5–5.1)
Sodium: 137 mmol/L (ref 135–145)
Total Bilirubin: 0.3 mg/dL (ref 0.3–1.2)
Total Protein: 6.6 g/dL (ref 6.5–8.1)

## 2022-01-07 LAB — CBC WITH DIFFERENTIAL (CANCER CENTER ONLY)
Abs Immature Granulocytes: 0.01 10*3/uL (ref 0.00–0.07)
Basophils Absolute: 0 10*3/uL (ref 0.0–0.1)
Basophils Relative: 1 %
Eosinophils Absolute: 0 10*3/uL (ref 0.0–0.5)
Eosinophils Relative: 0 %
HCT: 31.4 % — ABNORMAL LOW (ref 36.0–46.0)
Hemoglobin: 10.2 g/dL — ABNORMAL LOW (ref 12.0–15.0)
Immature Granulocytes: 0 %
Lymphocytes Relative: 29 %
Lymphs Abs: 1.2 10*3/uL (ref 0.7–4.0)
MCH: 29.9 pg (ref 26.0–34.0)
MCHC: 32.5 g/dL (ref 30.0–36.0)
MCV: 92.1 fL (ref 80.0–100.0)
Monocytes Absolute: 0.3 10*3/uL (ref 0.1–1.0)
Monocytes Relative: 8 %
Neutro Abs: 2.7 10*3/uL (ref 1.7–7.7)
Neutrophils Relative %: 62 %
Platelet Count: 135 10*3/uL — ABNORMAL LOW (ref 150–400)
RBC: 3.41 MIL/uL — ABNORMAL LOW (ref 3.87–5.11)
RDW: 13.2 % (ref 11.5–15.5)
WBC Count: 4.2 10*3/uL (ref 4.0–10.5)
nRBC: 0 % (ref 0.0–0.2)

## 2022-01-07 MED ORDER — HEPARIN SOD (PORK) LOCK FLUSH 100 UNIT/ML IV SOLN: 500.0000 [IU] | INTRAVENOUS | Status: DC | PRN

## 2022-01-07 MED ORDER — SODIUM CHLORIDE 0.9% FLUSH
10.0000 mL | INTRAVENOUS | Status: AC | PRN
  Administered 2022-01-07: 10 mL

## 2022-01-07 NOTE — Telephone Encounter (Signed)
Pt was seen in flush room and had concerns with missed chemotherapy appt. Pt was rescheduled for 2/10 at 8am. Pt was called and made aware of new time and day. Pt verbalized understanding

## 2022-01-09 ENCOUNTER — Other Ambulatory Visit: Payer: Self-pay

## 2022-01-09 ENCOUNTER — Inpatient Hospital Stay: Payer: Medicaid Other

## 2022-01-09 VITALS — BP 113/73 | HR 94 | Temp 98.2°F | Resp 18 | Ht 65.0 in | Wt 148.0 lb

## 2022-01-09 DIAGNOSIS — Z5112 Encounter for antineoplastic immunotherapy: Secondary | ICD-10-CM | POA: Diagnosis not present

## 2022-01-09 DIAGNOSIS — D696 Thrombocytopenia, unspecified: Secondary | ICD-10-CM

## 2022-01-09 DIAGNOSIS — C50119 Malignant neoplasm of central portion of unspecified female breast: Secondary | ICD-10-CM

## 2022-01-09 DIAGNOSIS — C50312 Malignant neoplasm of lower-inner quadrant of left female breast: Secondary | ICD-10-CM

## 2022-01-09 DIAGNOSIS — C770 Secondary and unspecified malignant neoplasm of lymph nodes of head, face and neck: Secondary | ICD-10-CM

## 2022-01-09 MED ORDER — ACETAMINOPHEN 325 MG PO TABS
650.0000 mg | ORAL_TABLET | Freq: Once | ORAL | Status: AC
  Administered 2022-01-09: 650 mg via ORAL
  Filled 2022-01-09: qty 2

## 2022-01-09 MED ORDER — SODIUM CHLORIDE 0.9% FLUSH
10.0000 mL | INTRAVENOUS | Status: DC | PRN
  Administered 2022-01-09: 10 mL

## 2022-01-09 MED ORDER — SODIUM CHLORIDE 0.9 % IV SOLN: Freq: Once | INTRAVENOUS | Status: AC

## 2022-01-09 MED ORDER — TRASTUZUMAB-DKST CHEMO 150 MG IV SOLR
6.0000 mg/kg | Freq: Once | INTRAVENOUS | Status: AC
  Administered 2022-01-09: 399 mg via INTRAVENOUS
  Filled 2022-01-09: qty 19

## 2022-01-09 MED ORDER — HEPARIN SOD (PORK) LOCK FLUSH 100 UNIT/ML IV SOLN
500.0000 [IU] | Freq: Once | INTRAVENOUS | Status: AC | PRN
  Administered 2022-01-09: 500 [IU]

## 2022-01-09 NOTE — Patient Instructions (Signed)
[facility]]   Discharge Instructions: Thank you for choosing [facility] to provide your oncology and hematology care.   If you have a lab appointment with the Cancer Center, please go directly to the Cancer Center and check in at the registration area.   Wear comfortable clothing and clothing appropriate for easy access to any Portacath or PICC line.   We strive to give you quality time with your provider. You may need to reschedule your appointment if you arrive late (15 or more minutes).  Arriving late affects you and other patients whose appointments are after yours.  Also, if you miss three or more appointments without notifying the office, you may be dismissed from the clinic at the provider's discretion.      For prescription refill requests, have your pharmacy contact our office and allow 72 hours for refills to be completed.    Today you received the following chemotherapy and/or immunotherapy agents: Trastuzumab (Herceptin)       To help prevent nausea and vomiting after your treatment, we encourage you to take your nausea medication as directed.  BELOW ARE SYMPTOMS THAT SHOULD BE REPORTED IMMEDIATELY: *FEVER GREATER THAN 100.4 F (38 C) OR HIGHER *CHILLS OR SWEATING *NAUSEA AND VOMITING THAT IS NOT CONTROLLED WITH YOUR NAUSEA MEDICATION *UNUSUAL SHORTNESS OF BREATH *UNUSUAL BRUISING OR BLEEDING *URINARY PROBLEMS (pain or burning when urinating, or frequent urination) *BOWEL PROBLEMS (unusual diarrhea, constipation, pain near the anus) TENDERNESS IN MOUTH AND THROAT WITH OR WITHOUT PRESENCE OF ULCERS (sore throat, sores in mouth, or a toothache) UNUSUAL RASH, SWELLING OR PAIN  UNUSUAL VAGINAL DISCHARGE OR ITCHING   Items with * indicate a potential emergency and should be followed up as soon as possible or go to the Emergency Department if any problems should occur.  Please show the CHEMOTHERAPY ALERT CARD or IMMUNOTHERAPY ALERT  CARD at check-in to the Emergency Department and triage nurse.  Should you have questions after your visit or need to cancel or reschedule your appointment, please contact [facility]  Dept: [phone]  and follow the prompts.  Office hours are 8:00 a.m. to 4:30 p.m. Monday - Friday. Please note that voicemails left after 4:00 p.m. may not be returned until the following business day.  We are closed weekends and major holidays. You have access to a nurse at all times for urgent questions. Please call the main number to the clinic Dept: [phone] and follow the prompts.   For any non-urgent questions, you may also contact your provider using MyChart. We now offer e-Visits for anyone 18 and older to request care online for non-urgent symptoms. For details visit [email or web address].   Also download the MyChart app! Go to the app store, search "MyChart", open the app, select [facility], and log in with your MyChart username and password.  Due to Covid, a mask is required upon entering the hospital/clinic. If you do not have a mask, one will be given to you upon arrival. For doctor visits, patients may have 1 support person aged 18 or older with them. For treatment visits, patients cannot have anyone with them due to current Covid guidelines and our immunocompromised population.  

## 2022-01-14 ENCOUNTER — Ambulatory Visit: Payer: Medicaid Other

## 2022-01-14 ENCOUNTER — Other Ambulatory Visit: Payer: Medicaid Other

## 2022-01-22 ENCOUNTER — Other Ambulatory Visit: Payer: Medicaid Other

## 2022-01-22 ENCOUNTER — Ambulatory Visit: Payer: Medicaid Other

## 2022-01-28 ENCOUNTER — Telehealth: Payer: Self-pay | Admitting: *Deleted

## 2022-01-28 ENCOUNTER — Other Ambulatory Visit: Payer: Self-pay | Admitting: *Deleted

## 2022-01-28 DIAGNOSIS — C50011 Malignant neoplasm of nipple and areola, right female breast: Secondary | ICD-10-CM

## 2022-01-28 DIAGNOSIS — M797 Fibromyalgia: Secondary | ICD-10-CM

## 2022-01-28 DIAGNOSIS — C50012 Malignant neoplasm of nipple and areola, left female breast: Secondary | ICD-10-CM

## 2022-01-28 DIAGNOSIS — R2689 Other abnormalities of gait and mobility: Secondary | ICD-10-CM

## 2022-01-28 DIAGNOSIS — C7931 Secondary malignant neoplasm of brain: Secondary | ICD-10-CM

## 2022-01-28 DIAGNOSIS — C7951 Secondary malignant neoplasm of bone: Secondary | ICD-10-CM

## 2022-01-28 NOTE — Telephone Encounter (Signed)
Pt requested referral to Neuro Rehab per recurrence of balance and strength issues - referral placed. ? ?LOS also sent to adjust pt's schedule for next herceptin to be every 4 weeks. ? ?Pt aware of above. ?

## 2022-01-30 ENCOUNTER — Telehealth: Payer: Self-pay | Admitting: Hematology and Oncology

## 2022-01-30 NOTE — Telephone Encounter (Signed)
Sch per 3/2 inbasket, pt unable to do 3/10 appts, appts r/s for 3/16 ?

## 2022-02-04 ENCOUNTER — Ambulatory Visit: Payer: Medicaid Other | Admitting: Rehabilitative and Restorative Service Providers"

## 2022-02-05 ENCOUNTER — Encounter: Payer: Self-pay | Admitting: Oncology

## 2022-02-05 ENCOUNTER — Encounter: Payer: Self-pay | Admitting: Hematology and Oncology

## 2022-02-06 ENCOUNTER — Other Ambulatory Visit: Payer: Medicaid Other

## 2022-02-06 ENCOUNTER — Ambulatory Visit: Payer: Medicaid Other

## 2022-02-11 ENCOUNTER — Other Ambulatory Visit: Payer: Self-pay

## 2022-02-11 DIAGNOSIS — C50119 Malignant neoplasm of central portion of unspecified female breast: Secondary | ICD-10-CM

## 2022-02-12 ENCOUNTER — Ambulatory Visit: Payer: Medicaid Other | Admitting: Hematology and Oncology

## 2022-02-12 ENCOUNTER — Other Ambulatory Visit: Payer: Self-pay

## 2022-02-12 ENCOUNTER — Other Ambulatory Visit: Payer: Self-pay | Admitting: Family Medicine

## 2022-02-12 ENCOUNTER — Inpatient Hospital Stay: Payer: Medicaid Other | Attending: Medical

## 2022-02-12 ENCOUNTER — Other Ambulatory Visit: Payer: Self-pay | Admitting: Hematology and Oncology

## 2022-02-12 ENCOUNTER — Inpatient Hospital Stay: Payer: Medicaid Other

## 2022-02-12 ENCOUNTER — Ambulatory Visit: Payer: Medicaid Other

## 2022-02-12 ENCOUNTER — Other Ambulatory Visit: Payer: Medicaid Other

## 2022-02-12 VITALS — BP 124/79 | HR 90 | Temp 98.4°F | Resp 18 | Wt 145.2 lb

## 2022-02-12 DIAGNOSIS — C7931 Secondary malignant neoplasm of brain: Secondary | ICD-10-CM | POA: Diagnosis not present

## 2022-02-12 DIAGNOSIS — C7940 Secondary malignant neoplasm of unspecified part of nervous system: Secondary | ICD-10-CM | POA: Insufficient documentation

## 2022-02-12 DIAGNOSIS — Z5112 Encounter for antineoplastic immunotherapy: Secondary | ICD-10-CM | POA: Diagnosis present

## 2022-02-12 DIAGNOSIS — Z79899 Other long term (current) drug therapy: Secondary | ICD-10-CM | POA: Diagnosis not present

## 2022-02-12 DIAGNOSIS — C50119 Malignant neoplasm of central portion of unspecified female breast: Secondary | ICD-10-CM

## 2022-02-12 DIAGNOSIS — D696 Thrombocytopenia, unspecified: Secondary | ICD-10-CM

## 2022-02-12 DIAGNOSIS — C7951 Secondary malignant neoplasm of bone: Secondary | ICD-10-CM | POA: Diagnosis present

## 2022-02-12 DIAGNOSIS — C50312 Malignant neoplasm of lower-inner quadrant of left female breast: Secondary | ICD-10-CM

## 2022-02-12 DIAGNOSIS — C770 Secondary and unspecified malignant neoplasm of lymph nodes of head, face and neck: Secondary | ICD-10-CM

## 2022-02-12 DIAGNOSIS — K5909 Other constipation: Secondary | ICD-10-CM

## 2022-02-12 DIAGNOSIS — Z95828 Presence of other vascular implants and grafts: Secondary | ICD-10-CM

## 2022-02-12 LAB — CMP (CANCER CENTER ONLY)
ALT: 12 U/L (ref 0–44)
AST: 21 U/L (ref 15–41)
Albumin: 4.2 g/dL (ref 3.5–5.0)
Alkaline Phosphatase: 88 U/L (ref 38–126)
Anion gap: 7 (ref 5–15)
BUN: 15 mg/dL (ref 6–20)
CO2: 33 mmol/L — ABNORMAL HIGH (ref 22–32)
Calcium: 10.7 mg/dL — ABNORMAL HIGH (ref 8.9–10.3)
Chloride: 100 mmol/L (ref 98–111)
Creatinine: 1.09 mg/dL — ABNORMAL HIGH (ref 0.44–1.00)
GFR, Estimated: 58 mL/min — ABNORMAL LOW (ref >60)
Glucose, Bld: 97 mg/dL (ref 70–99)
Potassium: 3.8 mmol/L (ref 3.5–5.1)
Sodium: 140 mmol/L (ref 135–145)
Total Bilirubin: 0.3 mg/dL (ref 0.3–1.2)
Total Protein: 7 g/dL (ref 6.5–8.1)

## 2022-02-12 LAB — CBC WITH DIFFERENTIAL (CANCER CENTER ONLY)
Abs Immature Granulocytes: 0.02 10*3/uL (ref 0.00–0.07)
Basophils Absolute: 0 10*3/uL (ref 0.0–0.1)
Basophils Relative: 1 %
Eosinophils Absolute: 0 10*3/uL (ref 0.0–0.5)
Eosinophils Relative: 0 %
HCT: 32.6 % — ABNORMAL LOW (ref 36.0–46.0)
Hemoglobin: 11 g/dL — ABNORMAL LOW (ref 12.0–15.0)
Immature Granulocytes: 0 %
Lymphocytes Relative: 20 %
Lymphs Abs: 1 10*3/uL (ref 0.7–4.0)
MCH: 30.7 pg (ref 26.0–34.0)
MCHC: 33.7 g/dL (ref 30.0–36.0)
MCV: 91.1 fL (ref 80.0–100.0)
Monocytes Absolute: 0.5 10*3/uL (ref 0.1–1.0)
Monocytes Relative: 9 %
Neutro Abs: 3.4 10*3/uL (ref 1.7–7.7)
Neutrophils Relative %: 70 %
Platelet Count: 150 10*3/uL (ref 150–400)
RBC: 3.58 MIL/uL — ABNORMAL LOW (ref 3.87–5.11)
RDW: 12.7 % (ref 11.5–15.5)
WBC Count: 4.9 10*3/uL (ref 4.0–10.5)
nRBC: 0 % (ref 0.0–0.2)

## 2022-02-12 MED ORDER — SODIUM CHLORIDE 0.9 % IV SOLN: Freq: Once | INTRAVENOUS | Status: AC

## 2022-02-12 MED ORDER — HEPARIN SOD (PORK) LOCK FLUSH 100 UNIT/ML IV SOLN
500.0000 [IU] | Freq: Once | INTRAVENOUS | Status: AC | PRN
  Administered 2022-02-12: 500 [IU]

## 2022-02-12 MED ORDER — TRASTUZUMAB-DKST CHEMO 150 MG IV SOLR
6.0000 mg/kg | Freq: Once | INTRAVENOUS | Status: AC
  Administered 2022-02-12: 399 mg via INTRAVENOUS
  Filled 2022-02-12: qty 19

## 2022-02-12 MED ORDER — ACETAMINOPHEN 325 MG PO TABS
650.0000 mg | ORAL_TABLET | Freq: Once | ORAL | Status: AC
  Administered 2022-02-12: 650 mg via ORAL
  Filled 2022-02-12: qty 2

## 2022-02-12 MED ORDER — SODIUM CHLORIDE 0.9% FLUSH
10.0000 mL | INTRAVENOUS | Status: DC | PRN
  Administered 2022-02-12: 10 mL

## 2022-02-12 MED ORDER — SODIUM CHLORIDE 0.9% FLUSH
10.0000 mL | INTRAVENOUS | Status: AC | PRN
  Administered 2022-02-12: 10 mL

## 2022-02-12 NOTE — Progress Notes (Signed)
She is clinically asymptomatic upon discussion with our nurse Val ?We will schedule for end of March ?Ok to proceed with treatment today ? ?[name]  ? ?

## 2022-02-12 NOTE — Patient Instructions (Signed)
[facility]  Discharge Instructions: Thank you for choosing [facility] to provide your oncology and hematology care.   If you have a lab appointment with the Cancer Center, please go directly to the Cancer Center and check in at the registration area.   Wear comfortable clothing and clothing appropriate for easy access to any Portacath or PICC line.   We strive to give you quality time with your provider. You may need to reschedule your appointment if you arrive late (15 or more minutes).  Arriving late affects you and other patients whose appointments are after yours.  Also, if you miss three or more appointments without notifying the office, you may be dismissed from the clinic at the provider's discretion.      For prescription refill requests, have your pharmacy contact our office and allow 72 hours for refills to be completed.    Today you received the following chemotherapy and/or immunotherapy agents trastuzumab      To help prevent nausea and vomiting after your treatment, we encourage you to take your nausea medication as directed.  BELOW ARE SYMPTOMS THAT SHOULD BE REPORTED IMMEDIATELY: *FEVER GREATER THAN 100.4 F (38 C) OR HIGHER *CHILLS OR SWEATING *NAUSEA AND VOMITING THAT IS NOT CONTROLLED WITH YOUR NAUSEA MEDICATION *UNUSUAL SHORTNESS OF BREATH *UNUSUAL BRUISING OR BLEEDING *URINARY PROBLEMS (pain or burning when urinating, or frequent urination) *BOWEL PROBLEMS (unusual diarrhea, constipation, pain near the anus) TENDERNESS IN MOUTH AND THROAT WITH OR WITHOUT PRESENCE OF ULCERS (sore throat, sores in mouth, or a toothache) UNUSUAL RASH, SWELLING OR PAIN  UNUSUAL VAGINAL DISCHARGE OR ITCHING   Items with * indicate a potential emergency and should be followed up as soon as possible or go to the Emergency Department if any problems should occur.  Please show the CHEMOTHERAPY ALERT CARD or IMMUNOTHERAPY ALERT CARD at check-in to  the Emergency Department and triage nurse.  Should you have questions after your visit or need to cancel or reschedule your appointment, please contact [facility]  Dept: [phone]  and follow the prompts.  Office hours are 8:00 a.m. to 4:30 p.m. Monday - Friday. Please note that voicemails left after 4:00 p.m. may not be returned until the following business day.  We are closed weekends and major holidays. You have access to a nurse at all times for urgent questions. Please call the main number to the clinic Dept: [phone] and follow the prompts.   For any non-urgent questions, you may also contact your provider using MyChart. We now offer e-Visits for anyone 18 and older to request care online for non-urgent symptoms. For details visit [email or web address].   Also download the MyChart app! Go to the app store, search "MyChart", open the app, select [facility], and log in with your MyChart username and password.  Due to Covid, a mask is required upon entering the hospital/clinic. If you do not have a mask, one will be given to you upon arrival. For doctor visits, patients may have 1 support person aged 18 or older with them. For treatment visits, patients cannot have anyone with them due to current Covid guidelines and our immunocompromised population.   

## 2022-02-12 NOTE — Telephone Encounter (Signed)
Requested Prescriptions  ?Pending Prescriptions Disp Refills  ?? AMITIZA 8 MCG capsule [Pharmacy Med Name: AMITIZA 8 MCG CAPSULE] 60 capsule 0  ?  Sig: TAKE 1 CAPSULE (8 MCG TOTAL) BY MOUTH 2 (TWO) TIMES DAILY WITH A MEAL.  ?  ? Gastroenterology: Irritable Bowel Syndrome - lubiprostone Passed - 02/12/2022  9:12 AM  ?  ?  Passed - AST in normal range and within 360 days  ?  AST  ?Date Value Ref Range Status  ?01/07/2022 33 15 - 41 U/L Final  ?10/26/2017 35 (H) 5 - 34 U/L Final  ?   ?  ?  Passed - ALT in normal range and within 360 days  ?  ALT  ?Date Value Ref Range Status  ?01/07/2022 20 0 - 44 U/L Final  ?10/26/2017 48 0 - 55 U/L Final  ?   ?  ?  Passed - Valid encounter within last 12 months  ?  Recent Outpatient Visits   ?      ? 1 month ago Acute vaginitis  ? [place], [place] W, NP  ? 7 months ago Other constipation  ? [place], [place], MD  ? 11 months ago Annual physical exam  ? [place], [name], MD  ? 2 years ago Acute non-recurrent sinusitis of other sinus  ? [place] Charlott Rakes, MD  ? 2 years ago Other constipation  ? [place] Charlott Rakes, MD  ?  ?  ?Future Appointments   ?        ? In 2 weeks Charlott Rakes, MD [place]  ?  ? ?  ?  ?  ? ? ?

## 2022-02-13 ENCOUNTER — Ambulatory Visit (HOSPITAL_COMMUNITY): Payer: Medicaid Other

## 2022-02-18 ENCOUNTER — Other Ambulatory Visit: Payer: Self-pay | Admitting: Family Medicine

## 2022-02-18 DIAGNOSIS — J302 Other seasonal allergic rhinitis: Secondary | ICD-10-CM

## 2022-02-20 ENCOUNTER — Other Ambulatory Visit: Payer: Self-pay | Admitting: *Deleted

## 2022-02-20 MED ORDER — ONDANSETRON HCL 8 MG PO TABS: ORAL_TABLET | ORAL | 1 refills | Status: AC

## 2022-02-27 ENCOUNTER — Ambulatory Visit (HOSPITAL_COMMUNITY)
Admission: RE | Admit: 2022-02-27 | Discharge: 2022-02-27 | Disposition: A | Discharge status: Home / Self Care | Payer: Medicaid Other | Source: Ambulatory Visit | Attending: Hematology and Oncology | Admitting: Hematology and Oncology

## 2022-02-27 DIAGNOSIS — C7931 Secondary malignant neoplasm of brain: Secondary | ICD-10-CM | POA: Diagnosis present

## 2022-02-27 DIAGNOSIS — C7951 Secondary malignant neoplasm of bone: Secondary | ICD-10-CM | POA: Diagnosis present

## 2022-02-27 DIAGNOSIS — C50119 Malignant neoplasm of central portion of unspecified female breast: Secondary | ICD-10-CM | POA: Diagnosis present

## 2022-02-27 LAB — GLUCOSE, CAPILLARY: Glucose-Capillary: 118 mg/dL — ABNORMAL HIGH (ref 70–99)

## 2022-02-27 MED ORDER — FLUDEOXYGLUCOSE F - 18 (FDG) INJECTION
7.6700 | Freq: Once | INTRAVENOUS | Status: AC | PRN
  Administered 2022-02-27: 7.67 via INTRAVENOUS

## 2022-03-03 ENCOUNTER — Encounter: Payer: Medicaid Other | Admitting: Family Medicine

## 2022-03-04 ENCOUNTER — Ambulatory Visit: Payer: Medicaid Other

## 2022-03-05 ENCOUNTER — Encounter: Payer: Self-pay | Admitting: Hematology and Oncology

## 2022-03-05 ENCOUNTER — Other Ambulatory Visit: Payer: Self-pay

## 2022-03-05 ENCOUNTER — Ambulatory Visit: Payer: Medicaid Other

## 2022-03-05 ENCOUNTER — Inpatient Hospital Stay: Payer: Medicaid Other

## 2022-03-05 ENCOUNTER — Other Ambulatory Visit: Payer: Medicaid Other

## 2022-03-05 ENCOUNTER — Other Ambulatory Visit: Payer: Self-pay | Admitting: *Deleted

## 2022-03-05 ENCOUNTER — Inpatient Hospital Stay: Payer: Medicaid Other | Attending: Medical | Admitting: Hematology and Oncology

## 2022-03-05 VITALS — BP 118/66 | HR 86 | Temp 97.3°F | Resp 16 | Wt 146.6 lb

## 2022-03-05 DIAGNOSIS — C50119 Malignant neoplasm of central portion of unspecified female breast: Secondary | ICD-10-CM

## 2022-03-05 DIAGNOSIS — C7931 Secondary malignant neoplasm of brain: Secondary | ICD-10-CM | POA: Diagnosis not present

## 2022-03-05 DIAGNOSIS — Z79899 Other long term (current) drug therapy: Secondary | ICD-10-CM | POA: Diagnosis not present

## 2022-03-05 DIAGNOSIS — C770 Secondary and unspecified malignant neoplasm of lymph nodes of head, face and neck: Secondary | ICD-10-CM | POA: Diagnosis not present

## 2022-03-05 DIAGNOSIS — C7951 Secondary malignant neoplasm of bone: Secondary | ICD-10-CM | POA: Diagnosis present

## 2022-03-05 DIAGNOSIS — C50312 Malignant neoplasm of lower-inner quadrant of left female breast: Secondary | ICD-10-CM | POA: Diagnosis present

## 2022-03-05 DIAGNOSIS — D696 Thrombocytopenia, unspecified: Secondary | ICD-10-CM

## 2022-03-05 DIAGNOSIS — Z5112 Encounter for antineoplastic immunotherapy: Secondary | ICD-10-CM | POA: Diagnosis present

## 2022-03-05 LAB — CMP (CANCER CENTER ONLY)
ALT: 37 U/L (ref 0–44)
AST: 41 U/L (ref 15–41)
Albumin: 4 g/dL (ref 3.5–5.0)
Alkaline Phosphatase: 93 U/L (ref 38–126)
Anion gap: 4 — ABNORMAL LOW (ref 5–15)
BUN: 12 mg/dL (ref 6–20)
CO2: 34 mmol/L — ABNORMAL HIGH (ref 22–32)
Calcium: 9.7 mg/dL (ref 8.9–10.3)
Chloride: 103 mmol/L (ref 98–111)
Creatinine: 0.98 mg/dL (ref 0.44–1.00)
GFR, Estimated: >60 mL/min (ref >60)
Glucose, Bld: 95 mg/dL (ref 70–99)
Potassium: 3.8 mmol/L (ref 3.5–5.1)
Sodium: 141 mmol/L (ref 135–145)
Total Bilirubin: 0.2 mg/dL — ABNORMAL LOW (ref 0.3–1.2)
Total Protein: 6.4 g/dL — ABNORMAL LOW (ref 6.5–8.1)

## 2022-03-05 LAB — CBC WITH DIFFERENTIAL (CANCER CENTER ONLY)
Abs Immature Granulocytes: 0.02 10*3/uL (ref 0.00–0.07)
Basophils Absolute: 0 10*3/uL (ref 0.0–0.1)
Basophils Relative: 1 %
Eosinophils Absolute: 0 10*3/uL (ref 0.0–0.5)
Eosinophils Relative: 0 %
HCT: 30.6 % — ABNORMAL LOW (ref 36.0–46.0)
Hemoglobin: 10 g/dL — ABNORMAL LOW (ref 12.0–15.0)
Immature Granulocytes: 1 %
Lymphocytes Relative: 26 %
Lymphs Abs: 1 10*3/uL (ref 0.7–4.0)
MCH: 30.4 pg (ref 26.0–34.0)
MCHC: 32.7 g/dL (ref 30.0–36.0)
MCV: 93 fL (ref 80.0–100.0)
Monocytes Absolute: 0.3 10*3/uL (ref 0.1–1.0)
Monocytes Relative: 8 %
Neutro Abs: 2.5 10*3/uL (ref 1.7–7.7)
Neutrophils Relative %: 64 %
Platelet Count: 138 10*3/uL — ABNORMAL LOW (ref 150–400)
RBC: 3.29 MIL/uL — ABNORMAL LOW (ref 3.87–5.11)
RDW: 12.4 % (ref 11.5–15.5)
WBC Count: 3.9 10*3/uL — ABNORMAL LOW (ref 4.0–10.5)
nRBC: 0 % (ref 0.0–0.2)

## 2022-03-05 MED ORDER — HEPARIN SOD (PORK) LOCK FLUSH 100 UNIT/ML IV SOLN
500.0000 [IU] | Freq: Once | INTRAVENOUS | Status: AC | PRN
  Administered 2022-03-05: 500 [IU]

## 2022-03-05 MED ORDER — ACETAMINOPHEN 325 MG PO TABS
650.0000 mg | ORAL_TABLET | Freq: Once | ORAL | Status: AC
  Administered 2022-03-05: 650 mg via ORAL
  Filled 2022-03-05: qty 2

## 2022-03-05 MED ORDER — TRASTUZUMAB-DKST CHEMO 150 MG IV SOLR
6.0000 mg/kg | Freq: Once | INTRAVENOUS | Status: AC
  Administered 2022-03-05: 399 mg via INTRAVENOUS
  Filled 2022-03-05: qty 19

## 2022-03-05 MED ORDER — SODIUM CHLORIDE 0.9 % IV SOLN: Freq: Once | INTRAVENOUS | Status: AC

## 2022-03-05 MED ORDER — SODIUM CHLORIDE 0.9% FLUSH
10.0000 mL | INTRAVENOUS | Status: DC | PRN
  Administered 2022-03-05: 10 mL

## 2022-03-05 NOTE — Progress Notes (Signed)
[facility]  [phone] [phone]     ID: [name] DOB: [birth year]  MR#: [id number]  JYN#:[id number]  Patient Care Team: Hoy Register, MD as PCP - General (Family Medicine) Laurey Morale, MD as PCP - Advanced Heart Failure (Cardiology) Dorothy Puffer, MD as Consulting Physician (Radiation Oncology) Maeola Harman, MD as Consulting Physician (Neurosurgery) Thedore Mins, MD as Consulting Physician (Psychiatry) Barbaraann Cao Georgeanna Lea, MD as Consulting Physician (Psychiatry) Loree Fee, MD as Referring Physician (Plastic Surgery) Glenna Fellows, MD as Consulting Physician (Plastic Surgery) Dillingham, Alena Bills, DO as Attending Physician (Plastic Surgery) Lenard Lance, DMD (Dentistry) Rachel Moulds, MD as Consulting Physician (Hematology and Oncology)   CHIEF COMPLAINT: Metastatic triple positive breast cancer  CURRENT TREATMENT: trastuzumab Lowella Grip 21 days], anastrozole Xgeva held.   INTERVAL HISTORY:  [name] returns today for follow up and treatment of her metastatic triple positive breast cancer.  [name] continues on Herceptin and anastrozole for management of metastatic triple positive breast cancer.   She denies any new health complaints except for falls.  She describes it as her legs gave out, she recently fell in the bathtub and it took her 30 minutes to pull herself up.  No syncope.   She talked to Dr. Barbaraann Cao on the phone and he recommended to repeat an MRI brain. She denies any changes in her breast.  No change in breathing, cough, chest pain or chest pressure.  Rest of the pertinent 10 point ROS reviewed and negative.   COVID 19 VACCINATION STATUS: Status post Pfizer x2 with booster September 2021   BREAST CANCER HISTORY: From the original intake note:  Jamese was aware of a "lemon sized lump in" her left axilla for about a year before bringing it to medical attention. By then she had developed left breast and  left axillary swelling (June 2016). She presented to the local emergency room and had a chest CT scan 06/06/2015 which showed a nodule in the left breast measuring 0.9 cm and questionable left axillary adenopathy. She then proceeded to bilateral diagnostic mammography and left breast ultrasonography 06/19/2015. There were no prior films for comparison (last mammography 12 years prior).. The breast density was category C. Mammography showed in the left breast upper inner quadrant a 7 cm area including a small mass and significant pleomorphic calcifications. Ultrasonography defined the mass as measuring 1.2 cm. The left axilla appeared unremarkable. There was significant skin edema.  Biopsy of the left breast mass 06/19/2015 showed (SP [id number]) an invasive ductal carcinoma, grade 2, estrogen receptor 83% positive, progesterone receptor 26% positive, and HER-2 amplified by immunohistochemstry with a 3+ reading. The patient had biopsies of a separate area in the left breast August of the same year and this showed atypical ductal hyperplasia. (SP [id number]).  Accordingly after appropriate discussion on 08/21/2015 the patient proceeded to left mastectomy with left axillary sentinel lymph node sampling, which, since the lymph nodes were positive, extended to the procedure to left axillary lymph node dissection. The pathology (SP [id number]) showed an invasive ductal carcinoma, grade 3, measuring in excess of 9 cm. There were also skin satellites, not contiguous with the invasive carcinoma. Margins were clear and ample. There was evidence of lymphovascular invasion. A total of 15 lymph nodes were removed, including 5 sentinel lymph nodes, all of which were positive, so that the final total was 14 out of 15 lymph nodes involved by tumor. There was evidence of extranodal extension. The final pathology was pT4b pN2a, stage IIIB  CA-27-29 and CEA 09/19/2015 were non-informative October 2016.  Unfortunately CT scans of the  chest abdomen and pelvis 09/16/2015 showed bony metastases to the ri/ght scapula, left iliac crest, and also L4 and T-spine. There were questionable liver cysts which on repeat CT scan 03/02/2016 appear to be a little bit more well-defined, possibly a little larger. There were also some possible right upper lobe lung lesions.  Adjuvant treatment consisted of docetaxel, trastuzumab and pertuzumab, with the final (6th) docetaxel dose given 02/11/2016. She continues on trastuzumab and pertuzumab, with the 11th cycle given 05/05/2016. Echocardiogram 02/26/2016 showed an ejection fraction of 55%. She receives denosumab/Xgeva every 4 weeks.. She also receives radiation, started 06/09/201, to be completed 06/26/2016.  Her subsequent history is as detailed below   PAST MEDICAL HISTORY: Past Medical History:  Diagnosis Date   Alcohol abuse    none since 2013   Anemia    during chemo   Anxiety    At age 66   Arthritis Dx 2010   Bipolar disorder (HCC)    Breast cancer (HCC)    Bronchitis    Cancer (HCC)    breast mets to brain   CHF (congestive heart failure) (HCC)    Chronic pain    resolved per patient 12/25/19   Complication of anesthesia    Depression    Family history of adverse reaction to anesthesia    MOther had PONV   GERD (gastroesophageal reflux disease)    Headache    hx  migraines   HLD (hyperlipidemia)    Hypertension    Lymphedema of left arm    Opiate dependence (HCC)    PONV (postoperative nausea and vomiting)    Port-A-Cath in place    PTSD (post-traumatic stress disorder)    S/P endometrial ablation    in md's office    PAST SURGICAL HISTORY: Past Surgical History:  Procedure Laterality Date   APPLICATION OF CRANIAL NAVIGATION N/A 08/14/2016   Procedure: APPLICATION OF CRANIAL NAVIGATION;  Surgeon: Maeola Harman, MD;  Location: [facility];  Service: Neurosurgery;  Laterality: N/A;   BREAST RECONSTRUCTION Left    with silicone implant   COLONOSCOPY W/  POLYPECTOMY     CRANIOTOMY N/A 08/14/2016   Procedure: CRANIOTOMY TUMOR EXCISION WITH Normajean Glasgow;  Surgeon: Maeola Harman, MD;  Location: [facility];  Service: Neurosurgery;  Laterality: N/A;   FIBULA FRACTURE SURGERY Left    MASTECTOMY Left    RADIOLOGY WITH ANESTHESIA N/A 07/23/2016   Procedure: MRI OF BRAIN WITH AND WITHOUT;  Surgeon: Medication Radiologist, MD;  Location: [facility];  Service: Radiology;  Laterality: N/A;   RADIOLOGY WITH ANESTHESIA N/A 09/08/2016   Procedure: MRI OF BRAIN WITH AND WITHOUT CONTRAST;  Surgeon: Medication Radiologist, MD;  Location: [facility];  Service: Radiology;  Laterality: N/A;   RADIOLOGY WITH ANESTHESIA N/A 12/10/2016   Procedure: MRI OF BRAIN WITH AND WITHOUT;  Surgeon: Medication Radiologist, MD;  Location: [facility];  Service: Radiology;  Laterality: N/A;   RADIOLOGY WITH ANESTHESIA N/A 03/02/2017   Procedure: MRI of BRAIN W and W/OUT CONTRAST;  Surgeon: Medication Radiologist, MD;  Location: [facility];  Service: Radiology;  Laterality: N/A;   RADIOLOGY WITH ANESTHESIA N/A 07/29/2017   Procedure: RADIOLOGY WITH ANESTHESIA MRI OF BRAIN WITH AND WITHOUT CONTRAST;  Surgeon: Radiologist, Medication, MD;  Location: [facility];  Service: Radiology;  Laterality: N/A;   RADIOLOGY WITH ANESTHESIA N/A 12/07/2017   Procedure: MRI WITH ANESTHESIA OF BRAIN WITH AND WITHOUT CONTRAST;  Surgeon: Radiologist,  Medication, MD;  Location: [facility];  Service: Radiology;  Laterality: N/A;   RADIOLOGY WITH ANESTHESIA N/A 04/07/2018   Procedure: MRI OF BRAIN WITH AND WITHOUT CONTRAST;  Surgeon: Radiologist, Medication, MD;  Location: [facility];  Service: Radiology;  Laterality: N/A;   RADIOLOGY WITH ANESTHESIA N/A 08/23/2018   Procedure: MRI WITH ANESTHESIA OF THE BRAIN WITH AND WITHOUT;  Surgeon: Radiologist, Medication, MD;  [facility]: [facility];  Service: Radiology;  Laterality: N/A;   RADIOLOGY WITH ANESTHESIA N/A 01/24/2019   Procedure: MRI OF BRAIN WITH AND WITHOUT CONTRAST;  Surgeon: Radiologist, Medication, MD;   [facility]: [facility];  Service: Radiology;  Laterality: N/A;   RADIOLOGY WITH ANESTHESIA N/A 07/06/2019   Procedure: MRI WITH ANESTHESIA OF BRAIN WITH AND WITHOUT CONTRAST;  Surgeon: Radiologist, Medication, MD;  [facility];  Service: Radiology;  Laterality: N/A;   RADIOLOGY WITH ANESTHESIA N/A 01/11/2020   Procedure: MRI WITH ANESTHESIA BRAIN WITH AND WITHOUT;  Surgeon: Radiologist, Medication, MD;  [facility]: [facility];  Service: Radiology;  Laterality: N/A;   RADIOLOGY WITH ANESTHESIA N/A 05/21/2020   Procedure: MRI WITH ANESTHESIA OF BRAIN WITH AND WITHOUT CONTRAST;  Surgeon: Radiologist, Medication, MD;  [facility]: [facility];  Service: Radiology;  Laterality: N/A;   RADIOLOGY WITH ANESTHESIA N/A 01/02/2021   Procedure: MRI WITH ANESTHESIA  BRAIN WITH AND WITHOUT CONTRAST,TOTAL SPINE MET SCREENING;  Surgeon: Radiologist, Medication, MD;  [facility]: [facility];  Service: Radiology;  Laterality: N/A;   RADIOLOGY WITH ANESTHESIA N/A 10/07/2021   Procedure: MRI BRAIN WITH AND WITHOUT CONTRAST; MRI TOTAL SPINE;  METS SCREENING WITH ANESTHESIA;  Surgeon: Radiologist, Medication, MD;  Location: [facility];  Service: Radiology;  Laterality: N/A;   right power port placement Right     FAMILY HISTORY Family History  Problem Relation Age of Onset   Diabetes Mother    Bipolar disorder Mother    CAD Father    Breast cancer Neg Hx   The patient's father still living, age 85, in [place]. He had prostate cancer at some point in the past. The patient's mother died at age 60 from complications of diabetes. The patient had no brothers, 2 sisters. A paternal grandmother had lung cancer in the setting of tobacco abuse. There is no other history of cancer in the family to her knowledge   GYNECOLOGIC HISTORY:  No LMP recorded. Patient has had an ablation. Menarche approximately age 35. First live birth in 15. The patient is GX P2. She underwent endometrial ablation in 2016.   SOCIAL HISTORY: (Updated April 2022) The patient  is originally from [place]. She moved to this area from [place] because it was less expensive here she says.   She now has has her own 1 bedroom apartment on [address].  She is divorced. Her 2 children are Janyth Pupa who lives in [place] and works as a Nutritional therapist, and [place] who lives in [place] and works as Production designer, theatre/television/film of a healthcare facility.  Janyth Pupa has 2 children, Music therapist and Pamelia Hoit.  He married in 2022, his wife Zollie Scale, has 2 children of her own plus a step grandson).  Apolinar Junes has no children.    ADVANCED DIRECTIVES: Not in place; at the 06/03/2016 visit the patient was given the appropriate forms to complete and notarize at her discretion   HEALTH MAINTENANCE: Social History   Tobacco Use   Smoking status: Former    Packs/day: 1.00    Years: 43.00    Pack years: 43.00    Types: Cigarettes   Smokeless tobacco: Never  Tobacco comments:    3 day since last cigarette  Vaping Use   Vaping Use: Some days  Substance Use Topics   Alcohol use: No    Comment: no ETOH since 08/22/12   Drug use: No    Comment: states she's in recovery program for 7 years     Colonoscopy:  PAP:  Bone density:   Allergies  Allergen Reactions   Demerol [Meperidine Hcl] Itching and Nausea And Vomiting    INTOLERANCE >  N & V   Erythromycin Rash    Current Outpatient Medications on File Prior to Visit  Medication Sig Dispense Refill   acetaminophen (TYLENOL) 500 MG tablet Take 1 tablet (500 mg total) by mouth 2 (two) times daily as needed. Take with naproxen 250 mg tablet (Patient taking differently: Take 500 mg by mouth 2 (two) times daily. Take with naproxen 250 mg tablet) 180 tablet 4   AMITIZA 8 MCG capsule TAKE 1 CAPSULE (8 MCG TOTAL) BY MOUTH 2 (TWO) TIMES DAILY WITH A MEAL. 60 capsule 0   anastrozole (ARIMIDEX) 1 MG tablet Take 1 tablet (1 mg total) by mouth daily. 90 tablet 4   azelastine (ASTELIN) 0.1 % nasal spray Place 2 sprays into both nostrils daily. Use in each nostril as  directed (Patient taking differently: Place 1 spray into both nostrils daily. Use in each nostril as directed) 30 mL 12   carvedilol (COREG) 3.125 MG tablet TAKE 1 TABLET (3.125 MG TOTAL) BY MOUTH 2 TIMES DAILY FOR 10 DAYS, THEN 2 TABLETS 2 TIMES DAILY. (Patient not taking: Reported on 10/01/2021) 120 tablet 3   cholecalciferol (VITAMIN D3) 25 MCG (1000 UNIT) tablet Take 2 tablets (2,000 Units total) by mouth daily. (Patient taking differently: Take 5,000 Units by mouth daily.) 90 tablet 4   cromolyn (OPTICROM) 4 % ophthalmic solution Place 1 drop into both eyes daily.     docusate sodium (COLACE) 100 MG capsule TAKE 1 CAPSULE BY MOUTH ONCE A DAY 100 capsule 6   fluticasone (FLONASE) 50 MCG/ACT nasal spray SPRAY 2 SPRAYS INTO EACH NOSTRIL EVERY DAY 16 mL 0   gabapentin (NEURONTIN) 300 MG capsule Take 300-600 mg by mouth See admin instructions. Take 300 mg in the morning and 600 mg at bedtime     lamoTRIgine (LAMICTAL) 100 MG tablet Take 100 mg by mouth every morning.     lidocaine (XYLOCAINE) 2 % solution 5mL swish and swallow every 4 hours as needed for sore throat 100 mL 0   loratadine (CLARITIN) 10 MG tablet TAKE 1 TABLET BY MOUTH EVERY DAY 90 tablet 3   Lurasidone HCl 60 MG TABS Take 60 mg by mouth daily.     naproxen (NAPROSYN) 250 MG tablet Take 1 tablet (250 mg total) by mouth 2 (two) times daily as needed. Take together with tylenol 500 mg tablet (Patient taking differently: Take 250 mg by mouth 2 (two) times daily. Take together with tylenol 500 mg tablet) 180 tablet 4   ondansetron (ZOFRAN) 8 MG tablet TAKE 1 TABLET BY MOUTH EVERY 8 HOURS AS NEEDED FOR NAUSEA AND VOMITING 90 tablet 1   pantoprazole (PROTONIX) 40 MG tablet TAKE 1 TABLET BY MOUTH EVERY DAY 90 tablet 3   polyethylene glycol (MIRALAX / GLYCOLAX) 17 g packet Take 17 g by mouth daily as needed for mild constipation. (Patient taking differently: Take 17 g by mouth every 3 (three) days.) 30 each 6   rosuvastatin (CRESTOR) 10 MG  tablet TAKE 1 TABLET BY MOUTH  EVERY DAY 90 tablet 3   traZODone (DESYREL) 100 MG tablet Take 1 tablet (100 mg total) by mouth at bedtime. (Patient taking differently: Take 200-300 mg by mouth at bedtime.) 30 tablet 0   vitamin C (ASCORBIC ACID) 500 MG tablet Take 500 mg by mouth daily.     No current facility-administered medications on file prior to visit.    OBJECTIVE: White woman in no acute distress  Vitals:   03/05/22 1316  BP: 118/66  Pulse: 86  Resp: 16  Temp: (!) 97.3 F (36.3 C)  TempSrc: Temporal  SpO2: 99%  Weight: 146 lb 9.6 oz (66.5 kg)  Body mass index is 24.4 kg/m.  ECOG FS: 2   GENERAL: Patient appears chronically ill, is in a wheelchair and is in no acute distress. Physical Exam Constitutional:      Appearance: Normal appearance.  Cardiovascular:     Rate and Rhythm: Normal rate and regular rhythm.     Pulses: Normal pulses.     Heart sounds: Normal heart sounds.  Pulmonary:     Effort: Pulmonary effort is normal. No respiratory distress.     Breath sounds: Normal breath sounds.  Abdominal:     General: Abdomen is flat. Bowel sounds are normal.  Musculoskeletal:     Cervical back: Normal range of motion and neck supple. No rigidity.     Comments: She walks in small steps, limited range of motion  Lymphadenopathy:     Cervical: No cervical adenopathy.  Skin:    General: Skin is warm and dry.  Neurological:     General: No focal deficit present.     Mental Status: She is alert.  Psychiatric:        Mood and Affect: Mood normal.     LAB RESULTS: No results found for: LABCA2  CBC    Component Value Date/Time   WBC 3.9 (L) 03/05/2022 1303   WBC 3.8 (L) 03/27/2021 0941   RBC 3.29 (L) 03/05/2022 1303   HGB 10.0 (L) 03/05/2022 1303   HGB 12.1 10/26/2017 1038   HCT 30.6 (L) 03/05/2022 1303   HCT 35.7 10/26/2017 1038   PLT 138 (L) 03/05/2022 1303   PLT 167 10/26/2017 1038   MCV 93.0 03/05/2022 1303   MCV 98.4 10/26/2017 1038   MCH 30.4  03/05/2022 1303   MCHC 32.7 03/05/2022 1303   RDW 12.4 03/05/2022 1303   RDW 13.3 10/26/2017 1038   LYMPHSABS 1.0 03/05/2022 1303   LYMPHSABS 0.5 (L) 10/26/2017 1038   MONOABS 0.3 03/05/2022 1303   MONOABS 0.1 10/26/2017 1038   EOSABS 0.0 03/05/2022 1303   EOSABS 0.0 10/26/2017 1038   BASOSABS 0.0 03/05/2022 1303   BASOSABS 0.0 10/26/2017 1038      Latest Ref Rng & Units 02/12/2022   11:25 AM 01/07/2022    2:10 PM 12/11/2021   11:08 AM  CMP  Glucose 70 - 99 mg/dL 97   161   92    BUN 6 - 20 mg/dL 15   14   12     Creatinine 0.44 - 1.00 mg/dL 0.96   0.45   4.09    Sodium 135 - 145 mmol/L 140   137   141    Potassium 3.5 - 5.1 mmol/L 3.8   3.5   3.8    Chloride 98 - 111 mmol/L 100   101   100    CO2 22 - 32 mmol/L 33   31   34  Calcium 8.9 - 10.3 mg/dL 29.5   9.9   62.1    Total Protein 6.5 - 8.1 g/dL 7.0   6.6   6.6    Total Bilirubin 0.3 - 1.2 mg/dL 0.3   0.3   0.3    Alkaline Phos 38 - 126 U/L 88   69   81    AST 15 - 41 U/L 21   33   23    ALT 0 - 44 U/L 12   20   16       STUDIES: NM PET Image Restag (PS) Skull Base To Thigh  Result Date: 03/01/2022 CLINICAL DATA:  Subsequent treatment strategy for metastatic breast cancer. EXAM: NUCLEAR MEDICINE PET SKULL BASE TO THIGH TECHNIQUE: 7.67 mCi F-18 FDG was injected intravenously. Full-ring PET imaging was performed from the skull base to thigh after the radiotracer. CT data was obtained and used for attenuation correction and anatomic localization. Fasting blood glucose: 118 mg/dl COMPARISON:  30/86/5784 FINDINGS: Mediastinal blood pool activity: SUV max 1.16 Liver activity: SUV max NA NECK: No hypermetabolic lymph nodes in the neck. Incidental CT findings: none CHEST: No hypermetabolic mediastinal or hilar nodes. No suspicious pulmonary nodules on the CT scan. A left breast prosthesis is noted. No hypermetabolic lesions in the right breast. No supraclavicular or axillary adenopathy. Incidental CT findings: The Port-A-Cath is stable.  Streaky bibasilar atelectasis. ABDOMEN/PELVIS: No abnormal hypermetabolic activity within the liver, pancreas, adrenal glands, or spleen. No hypermetabolic lymph nodes in the abdomen or pelvis. Incidental CT findings: none SKELETON: No hypermetabolic bone lesions to suggest active metastatic disease. Stable mixed lytic and sclerotic appearance of T9. There is a new healing fracture of the left eleventh rib posteriorly. Diffuse and fairly marked skeletal muscle uptake, particularly in the upper thorax and shoulder areas. This could be due to exercise, insulin or recent meal ingestion. Incidental CT findings: none IMPRESSION: 1. No PET-CT findings to suggest active metastatic disease. No pulmonary nodules or enlarged or hypermetabolic lymphadenopathy in the neck, chest, abdomen or pelvis. No hepatic or adrenal gland lesions. 2. Stable mixed lytic and sclerotic changes in the T9 vertebral body but no hypermetabolic bone lesions to suggest active metastatic disease. Electronically Signed   By: Rudie Meyer M.D.   On: 03/01/2022 13:47     ELIGIBLE FOR AVAILABLE RESEARCH PROTOCOL: no  ASSESSMENT: 61 y.o. [place] woman with stage IV left-sided breast cancer involving bone and central nervous system  (1) s/p left breast lower inner quadrant biopsy 06/19/2015 for a clinical T2-3 NX invasive ductal carcinoma, grade 2, triple positive.  (2) status post left mastectomy and axillary lymph node dissection  with immediate expander placement 07/18/2015 for an mpT4 pN2,stage IIIB invasive ductal carcinoma, grade 3, with negative margins.  (a) definitive implant exchange to be scheduled in December   METASTATIC DISEASE: October 2016  (3) CT scan of the chest abdomen and pelvis  09/16/2015 shows metastatic lesions in the right scapula, left iliac crest, L4, and T spine. There were questionable liver cysts, with repeat CT scan 03/02/2016 showing possible right upper lobe lung lesions and possibly increased liver  lesions  (a) CT scan of the chest 06/17/2016 shows no active disease in the lungs or liver  (b) Bone scan July 2017 showed no evidence of bony metastatic disease   (c) head CT 07/08/2016 showed a cerebellar lesion, confirmed by MRI 07/23/2016, status post craniotomy 08/14/2016, confirming a metastatic deposit which was estrogen and progesterone receptor negative, HER-2 amplified with a signals  ratio of 7.16, number per cell 13.25  (d) CA 27-29 is not informative  (4) received docetaxel every 3 weeks 6 together with trastuzumab and pertuzumab, last docetaxel dose 02/11/2016  (5) adjuvant radiation7/03/2016 to 06/26/2016 at [facility]: 1. The Left chest wall was treated to 23.4 Gy in 13 fractions at 1.8 Gy per fraction. 2. The Left chest wall was boosted to 10 Gy in 5 fractions at 2 Gy per fraction. 3. The Left Sclav/PAB was treated to 23.4 Gy in 13 fractions at 1.8 Gy per fraction.   [Note: Including the patient's treatment in [place] (received 15 fractions per Dr. Anitra Lauth near [place], [place]), the patient received 50.4 Gy to the left chest wall and supraclavicular region. ]  (6) started trastuzumab and pertuzumab October 2016, continuing every 3 weeks,  (a) echocardiogram 02/26/2016 showed a well preserved ejection fraction  (b) echocardiogram 07/01/2016 shows an ejection fraction in the 60-65%   (c) pertuzumab discontinued 10/2016 with uncontrolled diarrhea  (d) echocardiogram 11/11/2016 showed an ejection fraction in the 60-65%  (e) echocardiogram 03/03/2017 shows an ejection fraction of 60-65%  (f) echocardiogram on 05/19/2017 shows an ejection fraction of 55-60%  (g) echocardiogram 09/24/2017 shows the ejection fraction in the 60-65%  (h) echocardiogram 02/14/2018 shows an ejection fraction in the 60-65%  (I) echocardiogram  06/30/2018 shows an ejection fraction in the 55-60%  (m) echocardiogram on 12/08/2018 shows an ejection fraction in the 60-65% range  (7) started denosumab/Xgeva  October 2017 given every 4 weeks, transitioned to every 8 weeks beginning 10/11/18 (every 6 weeks while giving TDM1 every 3 weeks)  (a) Xgeva held after 08/21/2020 dose due to dental concerns  (8) started anastrozole October 2017   (a) bone scan 11/10/2016 shows no active disease  (b) chest CT scan 11/10/2016 stable, with no evidence of active disease  (c) chest CT and bone scan 07/02/2017 show no evidence of active disease  (d) CT scan of the chest with contrast 11/10/2017 shows some left axillary edema, but no evidence of thrombosis or adenopathy in that area, 0.9 cm precarinal lymph and 0.7 cm right upper lobe nodule node; bone lesions stable  (e) CT of the chest 05/04/2018 shows a 1.4 cm right lower paratracheal node which is slightly increased and a new right prevascular mediastinal node measuring 0.7 cm.  Bone lesions are stable.  (f) chest CT on 07/01/2018 shows no definite findings of metastatic disease in the thorax. Previously noted borderline enlarged low right paratracheal lymph node is stable to slightly decreased in size   (e) chest CT on 12/30/2018 notes mild increase in right paratracheal adneopathy, recommended PET scan.  Pet scan on 01/19/2019 showed a hypermetabolic and enlarged right paratracheal lymph node consistent with breast cancer recurrence.  (f)Trastuzumab discontinued due to February 2020 scans   (g) TDM-1 started on 01/31/2019 given every 21 days.  (h) Chest CT 05/15/2019 shows decrease in mediastinal adenopathy  (i) CT chest on 08/17/2019 shows resolution of thoracic adenopathy  (j) TDM 1 discontinued after 10/11/2019 dose (8 months treatment) because of thrombocytopenia  (k) trastuzumab resumed 11/01/2019, repeated every 21 days  (l) echocardiogram 12/06/2019 showed an ejection fraction in the 60-65% range  (m) echo 08/12/2021 shows an ejection fraction in the 55-60% range.   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  (a) brain irradiation10/19/17-11/08/17: Whole brain/ 35 Gy in 14 fractions   (b) repeat brain MRI obtained 12/10/2016 shows no active disease in the brain  (c) repeat brain MRI 03/02/2017 shows no evidence of residual or recurrent disease  (d) repeat brain MRI 07/29/2017 shows no evidence of residual or recurrent disease  (e) repeat brain MRI 12/07/2017 shows no evidence of disease recurrence.  There is progressive white matter change secondary to prior treatment.  (f) repeat brain MRI 04/07/2018 showed no evidence of disease\  (g) repeat brain MRI on 08/23/2018 shows no evidence of disease  (h) repeat brain MRI on 01/25/2019 shows no evidence of disease  (I) brain MRI 07/06/2019 shows no evidence of active disease  (j) brain MRI 01/11/2020 shows a subdural hematoma measuring 0.8 cm but no evidence of recurrent metastatic disease  (k) brain MRI 05/21/2020 no evidence of recurrent disease; chronic changes c/w prior treatment and recent subdural (otherwise resolved)  (l) brain MRI 01/02/2021 shows no evidence of recurrent disease.  There is some dural thickening in the area of prior subdural hematoma and continued follow-up is recommended.  (13) peripheral disease monitoring:  (A) chest CT scan, brain CT and bone scan 01/31/2020 show no evidence of active disease  (B) CT chest with contrast 06/10/2020 showed no evidence of actice/progressive disease (C) MRI total spine 01/03/2021 shows no enhancing bony mets, prior sclerotic lesions not well visualized  (D) CT of the chest 01/20/2021 stable, with unchanged bony metastasis, scattered subcentimeter low-attenuation liver lesions too small to characterize, and no  measurable disease.  (E) PET scan 08/20/2021 shows no progression or active disease MRI brain in November 2022 showed stable postop changes from prior left cerebellar metastasis resection, no evidence of recurrent tumor at the site.  No new intracranial metastasis.  Persistent although decreased smooth dural thickening and enhancement overlying the right parietal lobe likely related to prior subdural hematoma at the site.    MR total spine med screening showed known bony metastasis within the T9 vertebral body with minimal peripheral enhancement unchanged from the prior total spine MRI of 01/02/2021 no MR evidence of new osseous metastasis.  Additional sclerotic lesion seen on prior CT exams are not appreciable by MR modality.  No pathologic fracture or epidural tumor involvement.  Cervical thoracic and lumbar spondylosis as outlined and unchanged.  No more than mild appreciable spinal canal narrowing. PET scan February 27, 2022 with no PET/CT findings to suggest active metastatic disease.  Stable Taken sclerotic changes in the T9 vertebral body but no hypermetabolic bone lesions to suggest active metastatic disease.  PLAN:  [name] has no clinical or radiographic sign of progression of her metastatic breast cancer.   She will continue on anastrozole and Herceptin.  She is due for an echocardiogram, this has been ordered.  No concern for cardiac compromise. We will consider imaging every 6 months unless there are any clinical signs for progression.  She will continue to follow-up with Dr. Barbaraann Cao for her intracranial disease monitoring.   She will return to clinic in approximately 6 weeks.  Continue Arimidex as prescribed.  Rivka Barbara held for dental issues.  Total encounter time 30 minutes.* in face to face visit time, chart review, lab review, order entry, care coordination, and documentation of the encounter.  *Total Encounter Time as defined by the Centers for Medicare and Medicaid  Services includes, in  addition to the face-to-face time of a patient visit (documented in the note above) non-face-to-face time: obtaining and reviewing outside history, ordering and reviewing medications, tests or procedures, care coordination (communications with other health care professionals or caregivers) and documentation in the medical record.

## 2022-03-05 NOTE — Patient Instructions (Signed)
[facility] MEDICAL [facility]COL[facility]  Discharge Instructions: Thank you for choosing [facility] to provide your oncology and hematology care.   If you have a lab appointment with the Cancer Center, please go directly to the Cancer Center and check in at the registration area.   Wear comfortable clothing and clothing appropriate for easy access to any Portacath or PICC line.   We strive to give you quality time with your provider. You may need to reschedule your appointment if you arrive late (15 or more minutes).  Arriving late affects you and other patients whose appointments are after yours.  Also, if you miss three or more appointments without notifying the office, you may be dismissed from the clinic at the provider's discretion.      For prescription refill requests, have your pharmacy contact our office and allow 72 hours for refills to be completed.    Today you received the following chemotherapy and/or immunotherapy agents trastuzumab      To help prevent nausea and vomiting after your treatment, we encourage you to take your nausea medication as directed.  BELOW ARE SYMPTOMS THAT SHOULD BE REPORTED IMMEDIATELY: *FEVER GREATER THAN 100.4 F (38 C) OR HIGHER *CHILLS OR SWEATING *NAUSEA AND VOMITING THAT IS NOT CONTROLLED WITH YOUR NAUSEA MEDICATION *UNUSUAL SHORTNESS OF BREATH *UNUSUAL BRUISING OR BLEEDING *URINARY PROBLEMS (pain or burning when urinating, or frequent urination) *BOWEL PROBLEMS (unusual diarrhea, constipation, pain near the anus) TENDERNESS IN MOUTH AND THROAT WITH OR WITHOUT PRESENCE OF ULCERS (sore throat, sores in mouth, or a toothache) UNUSUAL RASH, SWELLING OR PAIN  UNUSUAL VAGINAL DISCHARGE OR ITCHING   Items with * indicate a potential emergency and should be followed up as soon as possible or go to the Emergency Department if any problems should occur.  Please show the CHEMOTHERAPY ALERT CARD or IMMUNOTHERAPY ALERT CARD at check-in to  the Emergency Department and triage nurse.  Should you have questions after your visit or need to cancel or reschedule your appointment, please contact [facility]  Dept: [phone]  and follow the prompts.  Office hours are 8:00 a.m. to 4:30 p.m. Monday - Friday. Please note that voicemails left after 4:00 p.m. may not be returned until the following business day.  We are closed weekends and major holidays. You have access to a nurse at all times for urgent questions. Please call the main number to the clinic Dept: [phone] and follow the prompts.   For any non-urgent questions, you may also contact your provider using MyChart. We now offer e-Visits for anyone 18 and older to request care online for non-urgent symptoms. For details visit [email or web address].   Also download the MyChart app! Go to the app store, search "MyChart", open the app, select [facility], and log in with your MyChart username and password.  Due to Covid, a mask is required upon entering the hospital/clinic. If you do not have a mask, one will be given to you upon arrival. For doctor visits, patients may have 1 support [name] aged 18 or older with them. For treatment visits, patients cannot have anyone with them due to current Covid guidelines and our immunocompromised population.   

## 2022-03-05 NOTE — Progress Notes (Signed)
Per Dr Chryl Heck, okay to proceed with previous echo from September 2022 ?

## 2022-03-06 ENCOUNTER — Telehealth: Payer: Self-pay

## 2022-03-06 NOTE — Telephone Encounter (Signed)
Pt is scheduled for echo 03/16/22 at 1000. She is aware she should arrive at 0945 for check in. Also reviewed upcoming lab & infusion appt for 4/27. Printed calendar and mailed to pt per request.  ?

## 2022-03-09 ENCOUNTER — Encounter: Payer: Self-pay | Admitting: Physical Therapy

## 2022-03-09 ENCOUNTER — Ambulatory Visit: Payer: Medicaid Other | Attending: Hematology and Oncology | Admitting: Physical Therapy

## 2022-03-09 DIAGNOSIS — C50012 Malignant neoplasm of nipple and areola, left female breast: Secondary | ICD-10-CM | POA: Insufficient documentation

## 2022-03-09 DIAGNOSIS — Z483 Aftercare following surgery for neoplasm: Secondary | ICD-10-CM | POA: Diagnosis present

## 2022-03-09 DIAGNOSIS — C7931 Secondary malignant neoplasm of brain: Secondary | ICD-10-CM | POA: Diagnosis not present

## 2022-03-09 DIAGNOSIS — Z9181 History of falling: Secondary | ICD-10-CM | POA: Insufficient documentation

## 2022-03-09 DIAGNOSIS — C50011 Malignant neoplasm of nipple and areola, right female breast: Secondary | ICD-10-CM | POA: Diagnosis not present

## 2022-03-09 DIAGNOSIS — M6281 Muscle weakness (generalized): Secondary | ICD-10-CM | POA: Diagnosis present

## 2022-03-09 DIAGNOSIS — R2689 Other abnormalities of gait and mobility: Secondary | ICD-10-CM | POA: Insufficient documentation

## 2022-03-09 NOTE — Therapy (Signed)
OUTPATIENT PHYSICAL THERAPY NEURO EVALUATION   Patient Name: [name] MRN: [id number] DOB:1961-03-24, 61 y.o., female Today's Date: 03/09/2022  PCP: Hoy Register, MD REFERRING PROVIDER: Rachel Moulds, MD   PT End of Session - 03/09/22 [phone]     Visit Number 1    Number of Visits 13    Date for PT Re-Evaluation 06/05/22    Authorization Type Medicaid 2023    PT Start Time 0930    PT Stop Time 1014    PT Time Calculation (min) 44 min    Equipment Utilized During Treatment Gait belt    Activity Tolerance Patient tolerated treatment well    Behavior During Therapy WFL for tasks assessed/performed             Past Medical History:  Diagnosis Date   Alcohol abuse    none since 2013   Anemia    during chemo   Anxiety    At age 15   Arthritis Dx 2010   Bipolar disorder (HCC)    Breast cancer (HCC)    Bronchitis    Cancer (HCC)    breast mets to brain   CHF (congestive heart failure) (HCC)    Chronic pain    resolved per patient 12/25/19   Complication of anesthesia    Depression    Family history of adverse reaction to anesthesia    MOther had PONV   GERD (gastroesophageal reflux disease)    Headache    hx  migraines   HLD (hyperlipidemia)    Hypertension    Lymphedema of left arm    Opiate dependence (HCC)    PONV (postoperative nausea and vomiting)    Port-A-Cath in place    PTSD (post-traumatic stress disorder)    S/P endometrial ablation    in md's office   Past Surgical History:  Procedure Laterality Date   APPLICATION OF CRANIAL NAVIGATION N/A 08/14/2016   Procedure: APPLICATION OF CRANIAL NAVIGATION;  Surgeon: Maeola Harman, MD;  Location: [facility];  Service: Neurosurgery;  Laterality: N/A;   BREAST RECONSTRUCTION Left    with silicone implant   COLONOSCOPY W/ POLYPECTOMY     CRANIOTOMY N/A 08/14/2016   Procedure: CRANIOTOMY TUMOR EXCISION WITH Normajean Glasgow;  Surgeon: Maeola Harman, MD;  Location: [facility];  Service: Neurosurgery;   Laterality: N/A;   FIBULA FRACTURE SURGERY Left    MASTECTOMY Left    RADIOLOGY WITH ANESTHESIA N/A 07/23/2016   Procedure: MRI OF BRAIN WITH AND WITHOUT;  Surgeon: Medication Radiologist, MD;  Location: [facility] OR;  Service: Radiology;  Laterality: N/A;   RADIOLOGY WITH ANESTHESIA N/A 09/08/2016   Procedure: MRI OF BRAIN WITH AND WITHOUT CONTRAST;  Surgeon: Medication Radiologist, MD;  Location: [facility] OR;  Service: Radiology;  Laterality: N/A;   RADIOLOGY WITH ANESTHESIA N/A 12/10/2016   Procedure: MRI OF BRAIN WITH AND WITHOUT;  Surgeon: Medication Radiologist, MD;  Location: [facility] OR;  Service: Radiology;  Laterality: N/A;   RADIOLOGY WITH ANESTHESIA N/A 03/02/2017   Procedure: MRI of BRAIN W and W/OUT CONTRAST;  Surgeon: Medication Radiologist, MD;  Location: [facility] OR;  Service: Radiology;  Laterality: N/A;   RADIOLOGY WITH ANESTHESIA N/A 07/29/2017   Procedure: RADIOLOGY WITH ANESTHESIA MRI OF BRAIN WITH AND WITHOUT CONTRAST;  Surgeon: Radiologist, Medication, MD;  Location: [facility] OR;  Service: Radiology;  Laterality: N/A;   RADIOLOGY WITH ANESTHESIA N/A 12/07/2017   Procedure: MRI WITH ANESTHESIA OF BRAIN WITH AND WITHOUT CONTRAST;  Surgeon: Radiologist, Medication, MD;  Location: [facility];  Service: Radiology;  Laterality: N/A;   RADIOLOGY WITH ANESTHESIA N/A 04/07/2018   Procedure: MRI OF BRAIN WITH AND WITHOUT CONTRAST;  Surgeon: Radiologist, Medication, MD;  Location: [facility];  Service: Radiology;  Laterality: N/A;   RADIOLOGY WITH ANESTHESIA N/A 08/23/2018   Procedure: MRI WITH ANESTHESIA OF THE BRAIN WITH AND WITHOUT;  Surgeon: Radiologist, Medication, MD;  Location: [facility];  Service: Radiology;  Laterality: N/A;   RADIOLOGY WITH ANESTHESIA N/A 01/24/2019   Procedure: MRI OF BRAIN WITH AND WITHOUT CONTRAST;  Surgeon: Radiologist, Medication, MD;  Location: [facility];  Service: Radiology;  Laterality: N/A;   RADIOLOGY WITH ANESTHESIA N/A 07/06/2019   Procedure: MRI WITH ANESTHESIA OF BRAIN WITH AND WITHOUT CONTRAST;   Surgeon: Radiologist, Medication, MD;  Location: [facility];  Service: Radiology;  Laterality: N/A;   RADIOLOGY WITH ANESTHESIA N/A 01/11/2020   Procedure: MRI WITH ANESTHESIA BRAIN WITH AND WITHOUT;  Surgeon: Radiologist, Medication, MD;  Location: [facility];  Service: Radiology;  Laterality: N/A;   RADIOLOGY WITH ANESTHESIA N/A 05/21/2020   Procedure: MRI WITH ANESTHESIA OF BRAIN WITH AND WITHOUT CONTRAST;  Surgeon: Radiologist, Medication, MD;  Location: [facility];  Service: Radiology;  Laterality: N/A;   RADIOLOGY WITH ANESTHESIA N/A 01/02/2021   Procedure: MRI WITH ANESTHESIA  BRAIN WITH AND WITHOUT CONTRAST,TOTAL SPINE MET SCREENING;  Surgeon: Radiologist, Medication, MD;  Location: [facility];  Service: Radiology;  Laterality: N/A;   RADIOLOGY WITH ANESTHESIA N/A 10/07/2021   Procedure: MRI BRAIN WITH AND WITHOUT CONTRAST; MRI TOTAL SPINE;  METS SCREENING WITH ANESTHESIA;  Surgeon: Radiologist, Medication, MD;  Location: [facility];  Service: Radiology;  Laterality: N/A;   right power port placement Right    Patient Active Problem List   Diagnosis Date Noted   Physical deconditioning 04/14/2021   Balance disorder 04/14/2021   Shoulder disorder 04/14/2021   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   Lymphedema of left upper extremity 12/09/2020   Brain metastases 11/01/2019   Thrombocytopenia (HCC) 08/09/2019   Port-A-Cath in place 06/20/2019   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HCC) 03/30/2019   S/P mastectomy, left 12/09/2018   S/P breast reconstruction, left 12/09/2018   S/P radiation therapy 12/09/2018   GERD (gastroesophageal reflux disease) 12/10/2017   Edema 11/09/2017   Sensorineural hearing loss (SNHL) of both ears 11/05/2017   Vitamin D deficiency 01/18/2017   Bipolar I disorder, most recent episode depressed (HCC) 12/08/2016   Adjustment disorder with anxiety 12/06/2016   History of cancer metastatic to brain 07/27/2016   Iron deficiency anemia 06/26/2016   Bone metastases  06/03/2016   Primary cancer of lower-inner quadrant of left female breast (HCC) 06/01/2016   Tobacco abuse 07/22/2015   Alcoholism in remission (HCC) 07/22/2015   Malignant neoplasm of female breast (HCC) 07/01/2015   Current smoker 03/28/2015   Goals of care, counseling/discussion 03/28/2015   Seasonal allergies 03/28/2015   Anxiety state 02/28/2015   Fibromyalgia 02/28/2015   Family history of diabetes mellitus 02/28/2015   H/O alcohol abuse     ONSET DATE: 01/28/2022   REFERRING DIAG: C50.011,C50.012 (ICD-10-CM) - 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  They tried getting me an aide to come in and help me but they denied me saying I didn't meet the criteria."  Walking has been the most difficult.  "My legs start hurting and I can't walk far." Pt accompanied by: self  PERTINENT HISTORY: PTSD, opiate dependence, lymphedema of left arm, HTN, HLD, migraines, GERD, depression, CHF, arthritis, anxiety, alcohol abuse  Pt known to clinic -most recent therapy August 2022.  PAIN:  Are you having pain? No-usually has pain in neck, shoulders (left worse than right), and lower back  PRECAUTIONS: Fall  WEIGHT BEARING RESTRICTIONS No  FALLS: Has patient fallen in last 6 months? Yes. Number of falls 20  LIVING ENVIRONMENT: Lives with: lives with an adult companion-roommate Trinity, helps  pt intermittently with fall recovery Lives in: House/apartment Stairs: Yes: External: 4 steps; on right going up, on left going up, and can reach both Has following equipment at home: Walker - 4 wheeled  PLOF: Requires assistive device for independence, Needs assistance with ADLs, and Needs assistance with homemaking  PATIENT GOALS "For me to stick to it"  "I have no muscles because I lay and it's just because I can't walk"  OBJECTIVE:   DIAGNOSTIC FINDINGS: PET scan from 03/01/2022:  "1. No PET-CT findings to suggest active metastatic disease. No pulmonary nodules or enlarged or hypermetabolic lymphadenopathy in the neck, chest, abdomen or pelvis. No hepatic or adrenal gland lesions. 2. Stable mixed lytic and sclerotic changes in the T9 vertebral body but no hypermetabolic bone lesions to suggest active metastatic disease." MRI BRAIN from 10/07/2021:  "Stable postoperative changes from prior left cerebellar metastasis resection. No evidence of recurrent tumor at this site.   No new intracranial metastases.   Persistent although decreased smooth dural thickening and enhancement overlying the right parietal lobe, likely related to prior subdural hematoma at this site."  COGNITION: Overall cognitive status: Impaired: Attention: Deficits attention span is short Memory: Deficits pt cannot recall things from day prior or incidents when she fell; states her memory has been an issue Awareness: Deficits decreased safety awareness Problem solving: Deficits Pt cannot sequence well to promote turning and spatial navigation in gym Difficulty following multi-step commands SENSATION: WFL  COORDINATION: Heel-to-shin:  WFL  EDEMA:  None noted in BLE.  Pt has Left UE lymphedema.  Awaiting another sleeve fitting for LUE.  MUSCLE TONE: None noted in BLE.   POSTURE: posterior pelvic tilt and pt sits slumped back in chair  LE ROM:     Active  Right 03/09/2022 Left 03/09/2022  Hip flexion  [facility]  Hip extension    Hip abduction " "  Hip adduction " "  Hip internal rotation    Hip external rotation    Knee flexion " "  Knee extension " "  Ankle dorsiflexion " "  Ankle plantarflexion " "  Ankle inversion    Ankle eversion     (Blank rows = not tested)  MMT:    MMT Right 03/09/2022 Left 03/09/2022  Hip flexion 4-/5 4-/5  Hip extension    Hip abduction 3+/5 3+/5  Hip adduction 3+/5 3+/5  Hip internal rotation    Hip external rotation    Knee flexion 3/5 3/5  Knee extension 3/5 3/5  Ankle dorsiflexion 3+/5 3/5  Ankle plantarflexion    Ankle inversion    Ankle eversion    (Blank rows = not tested)  BED MOBILITY:  Sit to supine Complete Independence Supine to sit Complete Independence  TRANSFERS: Assistive device utilized: Walker - 4 wheeled  Sit to stand: Modified independence Stand to sit: Modified independence Chair to chair: Modified independence Floor: Total A and pt reports she can recover w/o assistance from floor intermittently   GAIT: Gait pattern: step through pattern, decreased stride length, trunk flexed, narrow BOS, poor foot clearance- Right, and poor foot clearance- Left Distance walked: various short bouts in gym Assistive device utilized: Walker - 4 wheeled Level of assistance: SBA Comments: Pt ambulates with slow pace in gym on level surfaces several bouts for assessment.  She demonstrates increased reliance on UE support and poor rollator management around obstacles in gym.  FUNCTIONAL TESTs:  5 times sit to stand: 20.53sec with hands on rollator; pt unable to perform when pushing from chair Timed up and go (TUG): 22.71 w/ rollator and CGA to turn all the way around prior to sitting 10 meter walk test: 13.77 sec = 0.21m/sec OR  2.40 ft/sec Berg Balance Scale: To be assessed  PATIENT SURVEYS:  FOTO N/A  TODAY'S TREATMENT:  Next session.   PATIENT EDUCATION: Education details: PT POC, assessments used, goals set.  Edu on safety  when turning to sit w/ rollator close to body and brakes engaged with reach back to find seat. Person educated: Patient Education method: Explanation Education comprehension: verbalized understanding and needs further education   HOME EXERCISE PROGRAM: To be initiated next session.    GOALS: Goals reviewed with patient? Yes  SHORT TERM GOALS: Target date:  04/24/2022  Pt will be independent with strength and balance HEP with supervision from roommate/boyfriend. Baseline:  To be established  Goal status: INITIAL  2.  Pt will demonstrate a gait speed of >2.60 feet/sec in order to decrease risk for falls. Baseline: 2.85ft/sec Goal status: INITIAL  3.  Pt will demonstrate TUG of <20 seconds in order to decrease risk of falls and improve functional mobility using LRAD.  Baseline: 22.71sec Goal status: INITIAL  4.  Pt will decrease 5xSTS to <18 seconds using no more than unilateral UE support from mat in order to demonstrate decreased risk for falls and improved functional bilateral LE strength and power. Baseline: 20.53 sec w/ hands on rollator Goal status: INITIAL  5.  BERG to be assessed and goal set as appropriate. Baseline:  Goal status: INITIAL  LONG TERM GOALS: Target date:  06/05/2022  Pt will be independent with finalized strength and balance HEP. Baseline: to be established Goal status: INITIAL  2.  Pt will demonstrate a gait speed of >2.80 feet/sec in order to decrease risk for falls. Baseline: 2.53ft/sec Goal status: INITIAL  3.  Pt will decrease 5xSTS to <16 seconds in order to demonstrate decreased risk for falls and improved functional bilateral LE strength and power. Baseline: 20.53 sec w/ hands on rollator Goal status: INITIAL  4.  Pt will demonstrate TUG of <18 seconds in order to decrease risk of falls and improve functional mobility using LRAD. Baseline: 22.71sec Goal status: INITIAL  5.  Pt will demonstrate improved turning and approach to seated  surface using rollator with proper use of brakes and reach for surface for safety with transfers and increased independence. Baseline: pt demos difficulty with rollator management, does not fully engage brakes or reach for surface Goal status: INITIAL  6.  Pt will teachback strategies for fall prevention and safe fall recovery with floor to chair recovery reviewed prior to d/c. Baseline: repeated  falls requiring assistance to recover Goal status: INITIAL  7.  BERG to be assessed.  Baseline:   Goal status:  INITIAL  ASSESSMENT:  CLINICAL IMPRESSION: Patient is a 61 y.o. female who was seen today for physical therapy evaluation and treatment for weakness and imbalance secondary to metastatic breast cancer.  Pt has a significant PMH of PTSD, opiate dependence, lymphedema of left arm, HTN, HLD, migraines, GERD, depression, CHF, arthritis, anxiety, alcohol abuse.  Identified impairments include decreased safety awareness and rollator management, gait deviations, impaired dynamic balance noted during gait around obstacles in gym, impaired problem solving and BLE weakness with left DF worse than right DF.  Evaluation via the following assessment tools: 5xSTS, , TUG, and history of repeated falls indicate fall risk.  She would benefit from skilled PT to address impairments as noted and progress towards long term goals.   OBJECTIVE IMPAIRMENTS Abnormal gait, decreased activity tolerance, decreased balance, decreased cognition, decreased endurance, decreased knowledge of condition, decreased knowledge of use of DME, decreased mobility, difficulty walking, decreased strength, decreased safety awareness, and improper body mechanics.   ACTIVITY LIMITATIONS cleaning, community activity, driving, meal prep, and yard work.   PERSONAL FACTORS Age, Fitness, Past/current experiences, Social background, and 3+ comorbidities: PTSD, opiate dependence, lymphedema of left arm, HTN, HLD, migraines, GERD,  depression, CHF, arthritis, anxiety, alcohol abuse  are also affecting patient's functional outcome.    REHAB POTENTIAL: Fair see personal factors and PMH, pt has received PT services for similar issue end of last year  CLINICAL DECISION MAKING: Evolving/moderate complexity  EVALUATION COMPLEXITY: Moderate  PLAN: PT FREQUENCY: 1x/week  PT DURATION: 12 weeks  PLANNED INTERVENTIONS: Therapeutic exercises, Therapeutic activity, Neuromuscular re-education, Balance training, Gait training, Patient/Family education, Joint mobilization, Stair training, Vestibular training, DME instructions, and Manual therapy  PLAN FOR NEXT SESSION: Assess BERG-set goal, initiate HEP for strength and balance, assess stairs - set goal   Sadie Haber, PT, DPT 03/09/2022, 9:01 PM

## 2022-03-16 ENCOUNTER — Ambulatory Visit (HOSPITAL_COMMUNITY)
Admission: RE | Admit: 2022-03-16 | Discharge: 2022-03-16 | Disposition: A | Discharge status: Home / Self Care | Payer: Medicaid Other | Source: Ambulatory Visit | Attending: Hematology and Oncology | Admitting: Hematology and Oncology

## 2022-03-16 DIAGNOSIS — I110 Hypertensive heart disease with heart failure: Secondary | ICD-10-CM | POA: Insufficient documentation

## 2022-03-16 DIAGNOSIS — I509 Heart failure, unspecified: Secondary | ICD-10-CM | POA: Insufficient documentation

## 2022-03-16 DIAGNOSIS — C50312 Malignant neoplasm of lower-inner quadrant of left female breast: Secondary | ICD-10-CM

## 2022-03-16 DIAGNOSIS — Z0189 Encounter for other specified special examinations: Secondary | ICD-10-CM | POA: Diagnosis not present

## 2022-03-16 LAB — ECHOCARDIOGRAM COMPLETE
AR max vel: 3.26 cm2
AV Area VTI: 3.32 cm2
AV Area mean vel: 3.27 cm2
AV Mean grad: 1 mmHg
AV Peak grad: 2.8 mmHg
Ao pk vel: 0.84 m/s
Area-P 1/2: 3.72 cm2
S' Lateral: 2.2 cm
Single Plane A4C EF: 45.6 %

## 2022-03-17 ENCOUNTER — Other Ambulatory Visit: Payer: Self-pay | Admitting: *Deleted

## 2022-03-17 ENCOUNTER — Ambulatory Visit: Payer: Medicaid Other | Admitting: Physical Therapy

## 2022-03-18 ENCOUNTER — Other Ambulatory Visit: Payer: Self-pay | Admitting: Family Medicine

## 2022-03-18 DIAGNOSIS — K5909 Other constipation: Secondary | ICD-10-CM

## 2022-03-24 ENCOUNTER — Other Ambulatory Visit: Payer: Self-pay | Admitting: Family Medicine

## 2022-03-24 DIAGNOSIS — J302 Other seasonal allergic rhinitis: Secondary | ICD-10-CM

## 2022-03-26 ENCOUNTER — Inpatient Hospital Stay: Payer: Medicaid Other

## 2022-03-26 ENCOUNTER — Encounter: Payer: Self-pay | Admitting: Physical Therapy

## 2022-03-26 ENCOUNTER — Ambulatory Visit: Payer: Medicaid Other | Admitting: Physical Therapy

## 2022-03-26 VITALS — BP 116/78 | HR 84

## 2022-03-26 DIAGNOSIS — Z9181 History of falling: Secondary | ICD-10-CM

## 2022-03-26 DIAGNOSIS — Z483 Aftercare following surgery for neoplasm: Secondary | ICD-10-CM

## 2022-03-26 DIAGNOSIS — R2689 Other abnormalities of gait and mobility: Secondary | ICD-10-CM

## 2022-03-26 DIAGNOSIS — M6281 Muscle weakness (generalized): Secondary | ICD-10-CM

## 2022-03-26 NOTE — Patient Instructions (Signed)
Access Code: [email or web address] ?URL: [email or web address] ?Date: 03/26/2022 ?Prepared by: Elease Etienne ? ?Exercises ?- Seated March  - 1 x daily - 5 x weekly - 2 sets - 20 reps ?- Seated Long Arc Quad  - 1 x daily - 5 x weekly - 2 sets - 15 reps ?

## 2022-03-26 NOTE — Therapy (Signed)
OUTPATIENT PHYSICAL THERAPY TREATMENT NOTE   Patient Name: Stephane Junkins MRN: [id number] DOB:07-27-1961, 61 y.o., female Today's Date: 03/27/2022  PCP: Hoy Register, MD REFERRING PROVIDER: Rachel Moulds, MD  END OF SESSION:   PT End of Session - 03/26/22 0938     Visit Number 2    Number of Visits 13    Date for PT Re-Evaluation 06/05/22    Authorization Type Medicaid 2023    Authorization Time Period [employer] has approved 3 PT visits 03/17/22 - 04/06/22    Authorization - Visit Number 1    Authorization - Number of Visits 3    PT Start Time 0930    PT Stop Time 1015    PT Time Calculation (min) 45 min    Equipment Utilized During Treatment Gait belt    Activity Tolerance Patient tolerated treatment well    Behavior During Therapy WFL for tasks assessed/performed             Past Medical History:  Diagnosis Date   Alcohol abuse    none since 2013   Anemia    during chemo   Anxiety    At age 39   Arthritis Dx 2010   Bipolar disorder (HCC)    Breast cancer (HCC)    Bronchitis    Cancer (HCC)    breast mets to brain   CHF (congestive heart failure) (HCC)    Chronic pain    resolved per patient 12/25/19   Complication of anesthesia    Depression    Family history of adverse reaction to anesthesia    MOther had PONV   GERD (gastroesophageal reflux disease)    Headache    hx  migraines   HLD (hyperlipidemia)    Hypertension    Lymphedema of left arm    Opiate dependence (HCC)    PONV (postoperative nausea and vomiting)    Port-A-Cath in place    PTSD (post-traumatic stress disorder)    S/P endometrial ablation    in md's office   Past Surgical History:  Procedure Laterality Date   APPLICATION OF CRANIAL NAVIGATION N/A 08/14/2016   Procedure: APPLICATION OF CRANIAL NAVIGATION;  Surgeon: Maeola Harman, MD;  Location: [facility];  Service: Neurosurgery;  Laterality: N/A;   BREAST RECONSTRUCTION Left    with silicone implant   COLONOSCOPY W/ POLYPECTOMY      CRANIOTOMY N/A 08/14/2016   Procedure: CRANIOTOMY TUMOR EXCISION WITH Normajean Glasgow;  Surgeon: Maeola Harman, MD;  Location: [facility];  Service: Neurosurgery;  Laterality: N/A;   FIBULA FRACTURE SURGERY Left    MASTECTOMY Left    RADIOLOGY WITH ANESTHESIA N/A 07/23/2016   Procedure: MRI OF BRAIN WITH AND WITHOUT;  Surgeon: Medication Radiologist, MD;  Location: [facility];  Service: Radiology;  Laterality: N/A;   RADIOLOGY WITH ANESTHESIA N/A 09/08/2016   Procedure: MRI OF BRAIN WITH AND WITHOUT CONTRAST;  Surgeon: Medication Radiologist, MD;  Location: [facility];  Service: Radiology;  Laterality: N/A;   RADIOLOGY WITH ANESTHESIA N/A 12/10/2016   Procedure: MRI OF BRAIN WITH AND WITHOUT;  Surgeon: Medication Radiologist, MD;  Location: [facility];  Service: Radiology;  Laterality: N/A;   RADIOLOGY WITH ANESTHESIA N/A 03/02/2017   Procedure: MRI of BRAIN W and W/OUT CONTRAST;  Surgeon: Medication Radiologist, MD;  Location: [facility];  Service: Radiology;  Laterality: N/A;   RADIOLOGY WITH ANESTHESIA N/A 07/29/2017   Procedure: RADIOLOGY WITH ANESTHESIA MRI OF BRAIN WITH AND WITHOUT CONTRAST;  Surgeon: Radiologist, Medication, MD;  [facility]: [facility];  Service: Radiology;  Laterality: N/A;   RADIOLOGY WITH ANESTHESIA N/A 12/07/2017   Procedure: MRI WITH ANESTHESIA OF BRAIN WITH AND WITHOUT CONTRAST;  Surgeon: Radiologist, Medication, MD;  Location: [facility];  Service: Radiology;  Laterality: N/A;   RADIOLOGY WITH ANESTHESIA N/A 04/07/2018   Procedure: MRI OF BRAIN WITH AND WITHOUT CONTRAST;  Surgeon: Radiologist, Medication, MD;  [facility]: [facility];  Service: Radiology;  Laterality: N/A;   RADIOLOGY WITH ANESTHESIA N/A 08/23/2018   Procedure: MRI WITH ANESTHESIA OF THE BRAIN WITH AND WITHOUT;  Surgeon: Radiologist, Medication, MD;  [facility]: [facility];  Service: Radiology;  Laterality: N/A;   RADIOLOGY WITH ANESTHESIA N/A 01/24/2019   Procedure: MRI OF BRAIN WITH AND WITHOUT CONTRAST;  Surgeon: Radiologist, Medication, MD;  [facility];  Service: Radiology;  Laterality: N/A;   RADIOLOGY WITH ANESTHESIA N/A 07/06/2019   Procedure: MRI WITH ANESTHESIA OF BRAIN WITH AND WITHOUT CONTRAST;  Surgeon: Radiologist, Medication, MD;  [facility];  Service: Radiology;  Laterality: N/A;   RADIOLOGY WITH ANESTHESIA N/A 01/11/2020   Procedure: MRI WITH ANESTHESIA BRAIN WITH AND WITHOUT;  Surgeon: Radiologist, Medication, MD;  [facility]: [facility];  Service: Radiology;  Laterality: N/A;   RADIOLOGY WITH ANESTHESIA N/A 05/21/2020   Procedure: MRI WITH ANESTHESIA OF BRAIN WITH AND WITHOUT CONTRAST;  Surgeon: Radiologist, Medication, MD;  [facility]: [facility];  Service: Radiology;  Laterality: N/A;   RADIOLOGY WITH ANESTHESIA N/A 01/02/2021   Procedure: MRI WITH ANESTHESIA  BRAIN WITH AND WITHOUT CONTRAST,TOTAL SPINE MET SCREENING;  Surgeon: Radiologist, Medication, MD;  [facility]: [facility];  Service: Radiology;  Laterality: N/A;   RADIOLOGY WITH ANESTHESIA N/A 10/07/2021   Procedure: MRI BRAIN WITH AND WITHOUT CONTRAST; MRI TOTAL SPINE;  METS SCREENING WITH ANESTHESIA;  Surgeon: Radiologist, Medication, MD;  Location: [facility];  Service: Radiology;  Laterality: N/A;   right power port placement Right    Patient Active Problem List   Diagnosis Date Noted   Physical deconditioning 04/14/2021   Balance disorder 04/14/2021   Shoulder disorder 04/14/2021   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   Lymphedema of left upper extremity 12/09/2020   Brain metastases 11/01/2019   Thrombocytopenia (HCC) 08/09/2019   Port-A-Cath in place 06/20/2019   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HCC) 03/30/2019   S/P mastectomy, left 12/09/2018   S/P breast reconstruction, left 12/09/2018   S/P radiation therapy 12/09/2018   GERD (gastroesophageal reflux disease) 12/10/2017   Edema 11/09/2017   Sensorineural hearing loss (SNHL) of both ears 11/05/2017   Vitamin D deficiency 01/18/2017   Bipolar I disorder, most recent episode depressed (HCC) 12/08/2016    Adjustment disorder with anxiety 12/06/2016   History of cancer metastatic to brain 07/27/2016   Iron deficiency anemia 06/26/2016   Bone metastases 06/03/2016   Primary cancer of lower-inner quadrant of left female breast (HCC) 06/01/2016   Tobacco abuse 07/22/2015   Alcoholism in remission (HCC) 07/22/2015   Malignant neoplasm of female breast (HCC) 07/01/2015   Current smoker 03/28/2015   Goals of care, counseling/discussion 03/28/2015   Seasonal allergies 03/28/2015   Anxiety state 02/28/2015   Fibromyalgia 02/28/2015   Family history of diabetes mellitus 02/28/2015   H/O alcohol abuse     REFERRING DIAG: C50.011,C50.012 (ICD-10-CM) - 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  PRECAUTIONS: Fall  SUBJECTIVE: Pt expresses interest in 2x/wk frequency.  Pt was out of town since last Thursday.  She states her legs have felt more like noodles the last 2 weeks.  She is unsure what happened.  She did a lot of walking while she was out of town.  Pt states she fell 6 times while away.  She has to call the company that made her lymphedema sleeve today to have them show her how to put it on.  She is considering assisted living.  PAIN:  Are you having pain? Yes: NPRS scale: 7/10 Pain location: low back Pain description: intermittent, dull Aggravating factors: anything, sleeping in the bed, getting up, bending over Relieving factors: naproxen   OBJECTIVE:   TODAY'S TREATMENT:  Pt  ambulates into clinic using rollator demonstrating intermittent festination-like gait pattern requiring PT to use minA to slow gait speed and promote upright trunk to prevent forward lean into rollator.  Pt demonstrates difficulty with transfer to chair initiating sit too early requiring modA to align hips to seat.  Initiated session w/ discussion of continued 1x/wk frequency due to decreased benefit from increased frequency and insurance visit limitations.  Pt reluctant initially, but states understanding and agreement to continue POC.      Asssessed stairs:  pt demonstrates reciprocal pattern using bilateral railing.  Negotiates 4x6" steps.  She requires CGA during descent due to mild shaking of the LE.  She has increased forward lean and requires increased time to complete task.   Assessed BERG:  [facility] PT Assessment - 03/26/22 0945       Balance   Balance Assessed Yes      Standardized Balance Assessment   Standardized Balance Assessment Berg Balance Test      Berg Balance Test   Sit to Stand Able to stand  independently using hands    Standing Unsupported Able to stand 2 minutes with supervision    Sitting with Back Unsupported but Feet Supported on Floor or Stool Able to sit 2 minutes under supervision    Stand to Sit Controls descent by using hands    Transfers Able to transfer with verbal cueing and /or supervision    Standing Unsupported with Eyes Closed Able to stand 3 seconds    Standing Unsupported with Feet Together Needs help to attain position but able to stand for 30 seconds with feet together    From Standing, Reach Forward with Outstretched Arm Reaches forward but needs supervision    From Standing Position, Pick up Object from Floor Unable to pick up and needs supervision   Can pick up when RUE on rollator handle.   From Standing Position, Turn to Look Behind Over each Shoulder Turn sideways only but maintains balance    Turn 360 Degrees Needs assistance while turning     Standing Unsupported, Alternately Place Feet on Step/Stool Needs assistance to keep from falling or unable to try    Standing Unsupported, One Foot in Front Needs help to step but can hold 15 seconds    Standing on One Leg Tries to lift leg/unable to hold 3 seconds but remains standing independently    Total Score 23    Berg comment: High fall risk  Initiated HEP.  See below for details.   Pt requires significant and prolonged rest between each task.   PATIENT EDUCATION: Education details: Edu on initial HEP.  See treatment for further discussion w/ pt.  Pt initiates discussion about assisted living w/ PT providing edu on current level of assist pt requires for safety and to prevent falling. Person educated: Patient Education method: Explanation Education comprehension: verbalized understanding and needs further education     HOME EXERCISE PROGRAM: Access Code: [email or web address] URL: [email or web address] Date: 03/26/2022 Prepared by: Camille Bal  Exercises - Seated March  - 1 x daily - 5 x weekly - 2 sets - 20 reps - Seated Long Arc Quad  - 1 x daily - 5 x weekly - 2 sets - 15 reps       GOALS: Goals reviewed with patient? Yes   SHORT TERM GOALS: Target date:  04/24/2022   Pt will be independent with strength and balance HEP with supervision from roommate/boyfriend. Baseline:  To be established  Goal status: INITIAL   2.  Pt will demonstrate a gait speed of >2.60 feet/sec in order to decrease risk for falls. Baseline: 2.23ft/sec Goal status: INITIAL   3.  Pt will demonstrate TUG of <20 seconds in order to decrease risk of falls and improve functional mobility using LRAD.  Baseline: 22.71sec Goal status: INITIAL   4.  Pt will decrease 5xSTS to <18 seconds using no more than unilateral UE support from mat in order to demonstrate decreased risk for falls and improved functional bilateral LE strength and power. Baseline: 20.53 sec w/ hands on  rollator Goal status: INITIAL   5.  Pt will increase BERG balance score to 26/56 to demonstrate improved static balance. Baseline: 23/56 Goal status: INITIAL   LONG TERM GOALS: Target date:  06/05/2022   Pt will be independent with finalized strength and balance HEP. Baseline: to be established Goal status: INITIAL   2.  Pt will demonstrate a gait speed of >2.80 feet/sec in order to decrease risk for falls. Baseline: 2.92ft/sec Goal status: INITIAL   3.  Pt will decrease 5xSTS to <16 seconds in order to demonstrate decreased risk for falls and improved functional bilateral LE strength and power. Baseline: 20.53 sec w/ hands on rollator Goal status: INITIAL   4.  Pt will demonstrate TUG of <18 seconds in order to decrease risk of falls and improve functional mobility using LRAD. Baseline: 22.71sec Goal status: INITIAL   5.  Pt will demonstrate improved turning and approach to seated surface using rollator with proper use of brakes and reach for surface for safety with transfers and increased independence. Baseline: pt demos difficulty with rollator management, does not fully engage brakes or reach for surface Goal status: INITIAL   6.  Pt will teachback strategies for fall prevention and safe fall recovery with floor to chair recovery reviewed prior to d/c. Baseline: repeated falls requiring assistance to recover Goal status: INITIAL   7.  Pt will increase BERG balance score to 30/56 to demonstrate improved static balance.            Baseline: 23/56            Goal status:  INITIAL   ASSESSMENT:   CLINICAL IMPRESSION: Assessed BERG during session with pt scoring 23/56 indicating she is a high fall risk.  Initiated HEP for seated strengthening to promote safety in home environment as pt does not currently have adequate help or supervision full time.  Continue per POC.  OBJECTIVE IMPAIRMENTS Abnormal gait, decreased activity tolerance, decreased balance, decreased cognition,  decreased endurance, decreased knowledge of condition, decreased knowledge of use of DME, decreased mobility, difficulty walking, decreased strength, decreased safety awareness, and improper body mechanics.    ACTIVITY LIMITATIONS cleaning, community activity, driving, meal prep, and yard work.    PERSONAL FACTORS Age, Fitness, Past/current experiences, Social background, and 3+ comorbidities: PTSD, opiate dependence, lymphedema of left arm, HTN, HLD, migraines, GERD, depression, CHF, arthritis, anxiety, alcohol abuse  are also affecting patient's functional outcome.      REHAB POTENTIAL: Fair see personal factors and PMH, pt has received PT services for similar issue end of last year   CLINICAL DECISION MAKING: Evolving/moderate complexity   EVALUATION COMPLEXITY: Moderate   PLAN: PT FREQUENCY: 1x/week   PT DURATION: 12 weeks   PLANNED INTERVENTIONS: Therapeutic exercises, Therapeutic activity, Neuromuscular re-education, Balance training, Gait training, Patient/Family education, Joint mobilization, Stair training, Vestibular training, DME instructions, and Manual therapy   PLAN FOR NEXT SESSION: Work on transfers mat table<>chair, gait w/ rollator vs RW-emphasis on stride, upright posture, and AD management especially when approaching to sit, work on stairs-focus on descent steadiness, discuss fall recovery and strategies for fall prevention-provide handout as needed, SLS, tandem stance and walking, Add to HEP prn-bridges, STS (work on [employer] support)   Sadie Haber, PT, DPT 03/27/2022, 9:23 AM

## 2022-03-30 ENCOUNTER — Other Ambulatory Visit: Payer: Self-pay

## 2022-03-30 ENCOUNTER — Inpatient Hospital Stay: Payer: Medicaid Other | Attending: Medical

## 2022-03-30 ENCOUNTER — Inpatient Hospital Stay: Payer: Medicaid Other

## 2022-03-30 VITALS — BP 121/78 | HR 86 | Temp 98.4°F | Resp 16 | Ht 65.0 in | Wt 137.8 lb

## 2022-03-30 DIAGNOSIS — C50312 Malignant neoplasm of lower-inner quadrant of left female breast: Secondary | ICD-10-CM

## 2022-03-30 DIAGNOSIS — Z79899 Other long term (current) drug therapy: Secondary | ICD-10-CM | POA: Insufficient documentation

## 2022-03-30 DIAGNOSIS — C50119 Malignant neoplasm of central portion of unspecified female breast: Secondary | ICD-10-CM

## 2022-03-30 DIAGNOSIS — D696 Thrombocytopenia, unspecified: Secondary | ICD-10-CM

## 2022-03-30 DIAGNOSIS — C7931 Secondary malignant neoplasm of brain: Secondary | ICD-10-CM | POA: Insufficient documentation

## 2022-03-30 DIAGNOSIS — Z95828 Presence of other vascular implants and grafts: Secondary | ICD-10-CM

## 2022-03-30 DIAGNOSIS — C7951 Secondary malignant neoplasm of bone: Secondary | ICD-10-CM | POA: Insufficient documentation

## 2022-03-30 DIAGNOSIS — Z5112 Encounter for antineoplastic immunotherapy: Secondary | ICD-10-CM | POA: Diagnosis present

## 2022-03-30 DIAGNOSIS — C770 Secondary and unspecified malignant neoplasm of lymph nodes of head, face and neck: Secondary | ICD-10-CM

## 2022-03-30 DIAGNOSIS — F411 Generalized anxiety disorder: Secondary | ICD-10-CM

## 2022-03-30 LAB — CBC WITH DIFFERENTIAL (CANCER CENTER ONLY)
Abs Immature Granulocytes: 0.01 10*3/uL (ref 0.00–0.07)
Basophils Absolute: 0 10*3/uL (ref 0.0–0.1)
Basophils Relative: 1 %
Eosinophils Absolute: 0 10*3/uL (ref 0.0–0.5)
Eosinophils Relative: 0 %
HCT: 33.9 % — ABNORMAL LOW (ref 36.0–46.0)
Hemoglobin: 11.2 g/dL — ABNORMAL LOW (ref 12.0–15.0)
Immature Granulocytes: 0 %
Lymphocytes Relative: 24 %
Lymphs Abs: 1.1 10*3/uL (ref 0.7–4.0)
MCH: 30.4 pg (ref 26.0–34.0)
MCHC: 33 g/dL (ref 30.0–36.0)
MCV: 91.9 fL (ref 80.0–100.0)
Monocytes Absolute: 0.3 10*3/uL (ref 0.1–1.0)
Monocytes Relative: 8 %
Neutro Abs: 3 10*3/uL (ref 1.7–7.7)
Neutrophils Relative %: 67 %
Platelet Count: 138 10*3/uL — ABNORMAL LOW (ref 150–400)
RBC: 3.69 MIL/uL — ABNORMAL LOW (ref 3.87–5.11)
RDW: 11.9 % (ref 11.5–15.5)
WBC Count: 4.4 10*3/uL (ref 4.0–10.5)
nRBC: 0 % (ref 0.0–0.2)

## 2022-03-30 LAB — CMP (CANCER CENTER ONLY)
ALT: 16 U/L (ref 0–44)
AST: 25 U/L (ref 15–41)
Albumin: 4.3 g/dL (ref 3.5–5.0)
Alkaline Phosphatase: 99 U/L (ref 38–126)
Anion gap: 3 — ABNORMAL LOW (ref 5–15)
BUN: 15 mg/dL (ref 6–20)
CO2: 34 mmol/L — ABNORMAL HIGH (ref 22–32)
Calcium: 10.3 mg/dL (ref 8.9–10.3)
Chloride: 102 mmol/L (ref 98–111)
Creatinine: 1.06 mg/dL — ABNORMAL HIGH (ref 0.44–1.00)
GFR, Estimated: >60 mL/min (ref >60)
Glucose, Bld: 95 mg/dL (ref 70–99)
Potassium: 4.2 mmol/L (ref 3.5–5.1)
Sodium: 139 mmol/L (ref 135–145)
Total Bilirubin: 0.3 mg/dL (ref 0.3–1.2)
Total Protein: 7 g/dL (ref 6.5–8.1)

## 2022-03-30 MED ORDER — TRASTUZUMAB-DKST CHEMO 150 MG IV SOLR
6.0000 mg/kg | Freq: Once | INTRAVENOUS | Status: AC
  Administered 2022-03-30: 399 mg via INTRAVENOUS
  Filled 2022-03-30: qty 19

## 2022-03-30 MED ORDER — SODIUM CHLORIDE 0.9% FLUSH
10.0000 mL | Freq: Once | INTRAVENOUS | Status: AC
  Administered 2022-03-30: 10 mL

## 2022-03-30 MED ORDER — SODIUM CHLORIDE 0.9% FLUSH
10.0000 mL | INTRAVENOUS | Status: DC | PRN
  Administered 2022-03-30: 10 mL

## 2022-03-30 MED ORDER — HEPARIN SOD (PORK) LOCK FLUSH 100 UNIT/ML IV SOLN
500.0000 [IU] | Freq: Once | INTRAVENOUS | Status: AC | PRN
  Administered 2022-03-30: 500 [IU]

## 2022-03-30 MED ORDER — ACETAMINOPHEN 325 MG PO TABS
650.0000 mg | ORAL_TABLET | Freq: Once | ORAL | Status: AC
  Administered 2022-03-30: 650 mg via ORAL
  Filled 2022-03-30: qty 2

## 2022-03-30 MED ORDER — SODIUM CHLORIDE 0.9 % IV SOLN: Freq: Once | INTRAVENOUS | Status: AC

## 2022-03-30 NOTE — Patient Instructions (Signed)
[facility]   Discharge Instructions: Thank you for choosing [facility] to provide your oncology and hematology care.   If you have a lab appointment with the Cancer Center, please go directly to the Cancer Center and check in at the registration area.   Wear comfortable clothing and clothing appropriate for easy access to any Portacath or PICC line.   We strive to give you quality time with your provider. You may need to reschedule your appointment if you arrive late (15 or more minutes).  Arriving late affects you and other patients whose appointments are after yours.  Also, if you miss three or more appointments without notifying the office, you may be dismissed from the clinic at the provider's discretion.      For prescription refill requests, have your pharmacy contact our office and allow 72 hours for refills to be completed.    Today you received the following chemotherapy and/or immunotherapy agents: [id number].      To help prevent nausea and vomiting after your treatment, we encourage you to take your nausea medication as directed.  BELOW ARE SYMPTOMS THAT SHOULD BE REPORTED IMMEDIATELY: *FEVER GREATER THAN 100.4 F (38 C) OR HIGHER *CHILLS OR SWEATING *NAUSEA AND VOMITING THAT IS NOT CONTROLLED WITH YOUR NAUSEA MEDICATION *UNUSUAL SHORTNESS OF BREATH *UNUSUAL BRUISING OR BLEEDING *URINARY PROBLEMS (pain or burning when urinating, or frequent urination) *BOWEL PROBLEMS (unusual diarrhea, constipation, pain near the anus) TENDERNESS IN MOUTH AND THROAT WITH OR WITHOUT PRESENCE OF ULCERS (sore throat, sores in mouth, or a toothache) UNUSUAL RASH, SWELLING OR PAIN  UNUSUAL VAGINAL DISCHARGE OR ITCHING   Items with * indicate a potential emergency and should be followed up as soon as possible or go to the Emergency Department if any problems should occur.  Please show the CHEMOTHERAPY ALERT CARD or IMMUNOTHERAPY ALERT CARD at  check-in to the Emergency Department and triage nurse.  Should you have questions after your visit or need to cancel or reschedule your appointment, please contact [facility]  Dept: [phone]  and follow the prompts.  Office hours are 8:00 a.m. to 4:30 p.m. Monday - Friday. Please note that voicemails left after 4:00 p.m. may not be returned until the following business day.  We are closed weekends and major holidays. You have access to a nurse at all times for urgent questions. Please call the main number to the clinic Dept: [phone] and follow the prompts.   For any non-urgent questions, you may also contact your provider using MyChart. We now offer e-Visits for anyone 18 and older to request care online for non-urgent symptoms. For details visit [email or web address].   Also download the MyChart app! Go to the app store, search "MyChart", open the app, select [facility], and log in with your MyChart username and password.  Due to Covid, a mask is required upon entering the hospital/clinic. If you do not have a mask, one will be given to you upon arrival. For doctor visits, patients may have 1 support person aged 18 or older with them. For treatment visits, patients cannot have anyone with them due to current Covid guidelines and our immunocompromised population.    

## 2022-04-02 ENCOUNTER — Ambulatory Visit: Payer: Medicaid Other | Attending: Hematology and Oncology | Admitting: Physical Therapy

## 2022-04-02 ENCOUNTER — Encounter: Payer: Self-pay | Admitting: Physical Therapy

## 2022-04-02 VITALS — BP 119/81 | HR 105

## 2022-04-02 DIAGNOSIS — Z9181 History of falling: Secondary | ICD-10-CM | POA: Insufficient documentation

## 2022-04-02 DIAGNOSIS — M6281 Muscle weakness (generalized): Secondary | ICD-10-CM | POA: Diagnosis present

## 2022-04-02 DIAGNOSIS — Z483 Aftercare following surgery for neoplasm: Secondary | ICD-10-CM | POA: Insufficient documentation

## 2022-04-02 NOTE — Patient Instructions (Signed)
Access Code: [email or web address] ?URL: [email or web address] ?Date: 04/02/2022 ?Prepared by: Elease Etienne ? ?Exercises ?- Seated March  - 1 x daily - 5 x weekly - 2 sets - 20 reps ?- Seated Long Arc Quad  - 1 x daily - 5 x weekly - 2 sets - 15 reps ?- Supine Bridge  - 1 x daily - 5 x weekly - 2 sets - 10 reps ?- Sit to Stand with Armchair  - 1 x daily - 5 x weekly - 2 sets - 10 reps ?

## 2022-04-02 NOTE — Therapy (Signed)
OUTPATIENT PHYSICAL THERAPY TREATMENT NOTE   Patient Name: [name] MRN: [id number] DOB:February 13, 1961, 61 y.o., female Today's Date: 04/02/2022  PCP: Hoy Register, MD REFERRING PROVIDER: Rachel Moulds, MD  END OF SESSION:   PT End of Session - 04/02/22 0939     Visit Number 3    Number of Visits 13    Date for PT Re-Evaluation 06/05/22    Authorization Type Medicaid 2023    Authorization Time Period [employer] has approved 3 PT visits 03/17/22 - 04/06/22; additional auth requested.    Authorization - Visit Number 2    Authorization - Number of Visits 3    PT Start Time [phone]   pt late   PT Stop Time 1018    PT Time Calculation (min) 45 min    Equipment Utilized During Treatment Gait belt    Activity Tolerance Patient tolerated treatment well    Behavior During Therapy WFL for tasks assessed/performed             Past Medical History:  Diagnosis Date   Alcohol abuse    none since 2013   Anemia    during chemo   Anxiety    At age 12   Arthritis Dx 2010   Bipolar disorder (HCC)    Breast cancer (HCC)    Bronchitis    Cancer (HCC)    breast mets to brain   CHF (congestive heart failure) (HCC)    Chronic pain    resolved per patient 12/25/19   Complication of anesthesia    Depression    Family history of adverse reaction to anesthesia    MOther had PONV   GERD (gastroesophageal reflux disease)    Headache    hx  migraines   HLD (hyperlipidemia)    Hypertension    Lymphedema of left arm    Opiate dependence (HCC)    PONV (postoperative nausea and vomiting)    Port-A-Cath in place    PTSD (post-traumatic stress disorder)    S/P endometrial ablation    in md's office   Past Surgical History:  Procedure Laterality Date   APPLICATION OF CRANIAL NAVIGATION N/A 08/14/2016   Procedure: APPLICATION OF CRANIAL NAVIGATION;  Surgeon: Maeola Harman, MD;  [facility]: [facility];  Service: Neurosurgery;  Laterality: N/A;   BREAST RECONSTRUCTION Left    with silicone  implant   COLONOSCOPY W/ POLYPECTOMY     CRANIOTOMY N/A 08/14/2016   Procedure: CRANIOTOMY TUMOR EXCISION WITH Normajean Glasgow;  Surgeon: Maeola Harman, MD;  Location: [facility];  Service: Neurosurgery;  Laterality: N/A;   FIBULA FRACTURE SURGERY Left    MASTECTOMY Left    RADIOLOGY WITH ANESTHESIA N/A 07/23/2016   Procedure: MRI OF BRAIN WITH AND WITHOUT;  Surgeon: Medication Radiologist, MD;  Location: [facility];  Service: Radiology;  Laterality: N/A;   RADIOLOGY WITH ANESTHESIA N/A 09/08/2016   Procedure: MRI OF BRAIN WITH AND WITHOUT CONTRAST;  Surgeon: Medication Radiologist, MD;  [facility]: [facility];  Service: Radiology;  Laterality: N/A;   RADIOLOGY WITH ANESTHESIA N/A 12/10/2016   Procedure: MRI OF BRAIN WITH AND WITHOUT;  Surgeon: Medication Radiologist, MD;  [facility];  Service: Radiology;  Laterality: N/A;   RADIOLOGY WITH ANESTHESIA N/A 03/02/2017   Procedure: MRI of BRAIN W and W/OUT CONTRAST;  Surgeon: Medication Radiologist, MD;  [facility];  Service: Radiology;  Laterality: N/A;   RADIOLOGY WITH ANESTHESIA N/A 07/29/2017   Procedure: RADIOLOGY WITH ANESTHESIA MRI OF BRAIN WITH AND WITHOUT CONTRAST;  Surgeon:  Radiologist, Medication, MD;  [facility];  Service: Radiology;  Laterality: N/A;   RADIOLOGY WITH ANESTHESIA N/A 12/07/2017   Procedure: MRI WITH ANESTHESIA OF BRAIN WITH AND WITHOUT CONTRAST;  Surgeon: Radiologist, Medication, MD;  [facility];  Service: Radiology;  Laterality: N/A;   RADIOLOGY WITH ANESTHESIA N/A 04/07/2018   Procedure: MRI OF BRAIN WITH AND WITHOUT CONTRAST;  Surgeon: Radiologist, Medication, MD;  [facility];  Service: Radiology;  Laterality: N/A;   RADIOLOGY WITH ANESTHESIA N/A 08/23/2018   Procedure: MRI WITH ANESTHESIA OF THE BRAIN WITH AND WITHOUT;  Surgeon: Radiologist, Medication, MD;  [facility];  Service: Radiology;  Laterality: N/A;   RADIOLOGY WITH ANESTHESIA N/A 01/24/2019   Procedure: MRI OF BRAIN WITH AND WITHOUT CONTRAST;  Surgeon:  Radiologist, Medication, MD;  [facility];  Service: Radiology;  Laterality: N/A;   RADIOLOGY WITH ANESTHESIA N/A 07/06/2019   Procedure: MRI WITH ANESTHESIA OF BRAIN WITH AND WITHOUT CONTRAST;  Surgeon: Radiologist, Medication, MD;  [facility];  Service: Radiology;  Laterality: N/A;   RADIOLOGY WITH ANESTHESIA N/A 01/11/2020   Procedure: MRI WITH ANESTHESIA BRAIN WITH AND WITHOUT;  Surgeon: Radiologist, Medication, MD;  [facility];  Service: Radiology;  Laterality: N/A;   RADIOLOGY WITH ANESTHESIA N/A 05/21/2020   Procedure: MRI WITH ANESTHESIA OF BRAIN WITH AND WITHOUT CONTRAST;  Surgeon: Radiologist, Medication, MD;  [facility];  Service: Radiology;  Laterality: N/A;   RADIOLOGY WITH ANESTHESIA N/A 01/02/2021   Procedure: MRI WITH ANESTHESIA  BRAIN WITH AND WITHOUT CONTRAST,TOTAL SPINE MET SCREENING;  Surgeon: Radiologist, Medication, MD;  [facility];  Service: Radiology;  Laterality: N/A;   RADIOLOGY WITH ANESTHESIA N/A 10/07/2021   Procedure: MRI BRAIN WITH AND WITHOUT CONTRAST; MRI TOTAL SPINE;  METS SCREENING WITH ANESTHESIA;  Surgeon: Radiologist, Medication, MD;  [facility];  Service: Radiology;  Laterality: N/A;   right power port placement Right    Patient Active Problem List   Diagnosis Date Noted   Physical deconditioning 04/14/2021   Balance disorder 04/14/2021   Shoulder disorder 04/14/2021   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   Lymphedema of left upper extremity 12/09/2020   Brain metastases 11/01/2019   Thrombocytopenia (HCC) 08/09/2019   Port-A-Cath in place 06/20/2019   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HCC) 03/30/2019   S/P mastectomy, left 12/09/2018   S/P breast reconstruction, left 12/09/2018   S/P radiation therapy 12/09/2018   GERD (gastroesophageal reflux disease) 12/10/2017   Edema 11/09/2017   Sensorineural hearing loss (SNHL) of both ears 11/05/2017   Vitamin D deficiency 01/18/2017   Bipolar I disorder, most  recent episode depressed (HCC) 12/08/2016   Adjustment disorder with anxiety 12/06/2016   History of cancer metastatic to brain 07/27/2016   Iron deficiency anemia 06/26/2016   Bone metastases 06/03/2016   Primary cancer of lower-inner quadrant of left female breast (HCC) 06/01/2016   Tobacco abuse 07/22/2015   Alcoholism in remission (HCC) 07/22/2015   Malignant neoplasm of female breast (HCC) 07/01/2015   Current smoker 03/28/2015   Goals of care, counseling/discussion 03/28/2015   Seasonal allergies 03/28/2015   Anxiety state 02/28/2015   Fibromyalgia 02/28/2015   Family history of diabetes mellitus 02/28/2015   H/O alcohol abuse     REFERRING DIAG: C50.011,C50.012 (ICD-10-CM) - 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  PRECAUTIONS: Fall  SUBJECTIVE: Pt states she is moving into assisted living in [place], [place] on May 31 and wants to pursue PT.  She is only able to continue PT here in [facility] until May 25.  She fell trying to get onto the bed yesterday but was not injured.  She had to sit on the floor until she was strong enough to get herself up as her roommates did not answer the phone.  PAIN:  Are you having pain? Yes: NPRS scale: 7/10 Pain location: low back Pain description: intermittent, dull Aggravating factors: anything, sleeping in the bed, getting up, bending over Relieving factors: naproxen  BP taken on RUE: Today's Vitals   04/02/22 0953  BP: 119/81  Pulse: (!) 105   There is no height or weight on file to  calculate BMI.  OBJECTIVE:   TODAY'S TREATMENT:  Massed task practice transfers mat<>standard chair working on approach and STS technique w/ cues for safety and min-modA to prevent early sitting. Updated HEP.  See bolded below. Gait training w/ rollator x90' + 12' w/ RW, pt requires CGA-modA to prevent excessive forward lean and AD management, pt unable to adjust placement of AD or step size independently when cued.  Pt festinates frequently during gait requiring cues to stop and steady prior to resuming activity.  PT escorts pt to front waiting area following session +100' using rollator and CGA, suddenly pt has drastic lean forward requiring maxA to remain standing, stating "I cannot do this".  PT attempted to cue pt to bring rollator close to body to push into upright.  Pt refusing to try.  PT requests chair.  Pt returned to chair brought by ST and placed behind pt.  Pt transferred standard chair to w/c using modA for stand step transfer.  Pt requires cues for reach back to chair w/ PT providing maxA for lower into w/c.  Pt alert and oriented throughout.    PATIENT EDUCATION: Education details: Edu on Engineer, civil (consulting) department and saving non-emergency number in her phone or calling 911 to assist in getting off the floor when she falls. Person educated: Patient Education method: Explanation Education comprehension: verbalized understanding and needs further education     HOME EXERCISE PROGRAM: Access Code: [email or web address] URL: [email or web address] Date: 04/02/2022 Prepared by: Camille Bal  Exercises - Seated March  - 1 x daily - 5 x weekly - 2 sets - 20 reps - Seated Long Arc Quad  - 1 x daily - 5 x weekly - 2 sets - 15 reps - Supine Bridge  - 1 x daily - 5 x weekly - 2 sets - 10 reps - Sit to Stand with Armchair  - 1 x daily - 5 x weekly - 2 sets - 10 reps     GOALS: Goals reviewed with patient? Yes   SHORT TERM GOALS: Target date:  04/24/2022   Pt will be independent  with strength and balance HEP with supervision from roommate/boyfriend. Baseline:  To be established  Goal status: INITIAL   2.  Pt will demonstrate a gait speed of >2.60 feet/sec in order to decrease risk for falls. Baseline: 2.54ft/sec Goal status: INITIAL   3.  Pt will demonstrate TUG of <20 seconds in order to decrease risk of falls  and improve functional mobility using LRAD.  Baseline: 22.71sec Goal status: INITIAL   4.  Pt will decrease 5xSTS to <18 seconds using no more than unilateral UE support from mat in order to demonstrate decreased risk for falls and improved functional bilateral LE strength and power. Baseline: 20.53 sec w/ hands on rollator Goal status: INITIAL   5.  Pt will increase BERG balance score to 26/56 to demonstrate improved static balance. Baseline: 23/56 Goal status: INITIAL   LONG TERM GOALS: Target date:  06/05/2022   Pt will be independent with finalized strength and balance HEP. Baseline: to be established Goal status: INITIAL   2.  Pt will demonstrate a gait speed of >2.80 feet/sec in order to decrease risk for falls. Baseline: 2.42ft/sec Goal status: INITIAL   3.  Pt will decrease 5xSTS to <16 seconds in order to demonstrate decreased risk for falls and improved functional bilateral LE strength and power. Baseline: 20.53 sec w/ hands on rollator Goal status: INITIAL   4.  Pt will demonstrate TUG of <18 seconds in order to decrease risk of falls and improve functional mobility using LRAD. Baseline: 22.71sec Goal status: INITIAL   5.  Pt will demonstrate improved turning and approach to seated surface using rollator with proper use of brakes and reach for surface for safety with transfers and increased independence. Baseline: pt demos difficulty with rollator management, does not fully engage brakes or reach for surface Goal status: INITIAL   6.  Pt will teachback strategies for fall prevention and safe fall recovery with floor to chair recovery  reviewed prior to d/c. Baseline: repeated falls requiring assistance to recover Goal status: INITIAL   7.  Pt will increase BERG balance score to 30/56 to demonstrate improved static balance.            Baseline: 23/56            Goal status:  INITIAL   ASSESSMENT:   CLINICAL IMPRESSION: Time spent addressing transfers and gait w/ appropriate AD for safety.  Pt requires variable increased assistance with transfers due to decreased carryover from prior sessions and difficulty sequencing to approach seated surface.  She continues to benefit from skilled PT to further address deficits noted.     OBJECTIVE IMPAIRMENTS Abnormal gait, decreased activity tolerance, decreased balance, decreased cognition, decreased endurance, decreased knowledge of condition, decreased knowledge of use of DME, decreased mobility, difficulty walking, decreased strength, decreased safety awareness, and improper body mechanics.    ACTIVITY LIMITATIONS cleaning, community activity, driving, meal prep, and yard work.    PERSONAL FACTORS Age, Fitness, Past/current experiences, Social background, and 3+ comorbidities: PTSD, opiate dependence, lymphedema of left arm, HTN, HLD, migraines, GERD, depression, CHF, arthritis, anxiety, alcohol abuse  are also affecting patient's functional outcome.      REHAB POTENTIAL: Fair see personal factors and PMH, pt has received PT services for similar issue end of last year   CLINICAL DECISION MAKING: Evolving/moderate complexity   EVALUATION COMPLEXITY: Moderate   PLAN: PT FREQUENCY: 1x/week   PT DURATION: 12 weeks   PLANNED INTERVENTIONS: Therapeutic exercises, Therapeutic activity, Neuromuscular re-education, Balance training, Gait training, Patient/Family education, Joint mobilization, Stair training, Vestibular training, DME instructions, and Manual therapy   PLAN FOR NEXT SESSION: D/C planned for 5/25 due to pt moving to ALF, have pt schedule remaining appts beyond 5/10.   Work on transfers mat table<>chair, gait w/ rollator vs RW-emphasis on stride, upright posture, and AD management especially when approaching to sit, work on  stairs-focus on descent steadiness, discuss fall recovery and strategies for fall prevention-provide handout as needed, SLS, tandem stance and walking, Add to HEP prn   Sadie Haber, PT, DPT 04/02/2022, 1:59 PM

## 2022-04-03 ENCOUNTER — Telehealth: Payer: Self-pay | Admitting: Physical Therapy

## 2022-04-03 NOTE — Telephone Encounter (Signed)
PT spoke with pt about discontinuing visits due to placement in assisted living at end of month.  Explained Medicaid auth process due to existing approval for ALF, pursuing PT once placed, and obtaining referral for return to [facility] if placement does not occur.  Pt in agreement to discontinue visits at this time. ? ?Elease Etienne, PT, DPT ? ?

## 2022-04-06 ENCOUNTER — Ambulatory Visit: Payer: Medicaid Other | Attending: Hematology and Oncology | Admitting: Physical Therapy

## 2022-04-06 ENCOUNTER — Encounter: Payer: Self-pay | Admitting: Physical Therapy

## 2022-04-06 DIAGNOSIS — I890 Lymphedema, not elsewhere classified: Secondary | ICD-10-CM | POA: Insufficient documentation

## 2022-04-06 NOTE — Therapy (Signed)
Patient Name: Reeda Soohoo ?MRN: [id number] ?DOB:07-08-1961, 61 y.o., female ?Today's Date: 04/06/2022 ? ?Pt came into the clinic to be taught how to wear a Tribute compression garment and also how to use a Circaid Reduction kit. She explained that she is moving to [place], [place] to live in an assisted living facility later this month. She has limited UE function and difficulty donning upper body clothing. I explained that it would not make sense for me to cut her reduction kit and fit it to her without being able to modify it and follow up with her regularly. She agreed and was willing to try to wear her Tribute velcro wrap instead. I helped her don that and she demonstrated she would don/doff independently with her right hand. ?I called a rehab clinic in [place] who has lymphedema certified therapists and they said as soon as they receive a referral, they can get her scheduled. ?I sent an in-basket message to Dr. Chryl Heck (her oncologist) and the nurse navigator asking them to fax a referral to this location: ?[facility] ?[address] ?[address] ?[place], [place] [address] ?Fax # [phone] ?Phone # [phone] ?Ms. Talford knows to contact me with any questions or concerns and she was given the information of the rehab clinic as well. She was instructed to wear the Tribute as tolerated and that she may need to begin with 2 hours/day and increase as tolerated. ? ?Pt. Not charged for this visit due to lack of authorization.    ?  ? ?Annia Friendly, PT ?04/06/22 12:14 PM ? ?

## 2022-04-06 NOTE — Patient Instructions (Signed)
Ms. Justice is here for me to fit a compression garment on her. She let me know she's moving to [place], [place] at the end of the month to live at [facility]. A rehab clinic that has a lymphedema therapist in [place] can see her (5 miles from her assisted living facility). She really needs PT for her left arm lymphedema. I asked Dr. Chryl Heck to fax a referral to: ?[facility] ?[address] ?[place], [place] [address] ?Fax # [phone] ?Phone # [phone] ?They said they'd get you scheduled ASAP. ? ?Until they can see you, I would encourage you to wear your black Tribute garment for about 8 hours per day as long as it doesn't cause pain. ?When you go to PT in [place], bring your Circaid reduction kit and the Mondamin. ? ?Thanks, Inez Catalina ? ?

## 2022-04-07 ENCOUNTER — Telehealth: Payer: Self-pay | Admitting: Family Medicine

## 2022-04-07 DIAGNOSIS — F411 Generalized anxiety disorder: Secondary | ICD-10-CM

## 2022-04-07 NOTE — Telephone Encounter (Signed)
Routing to PCP for review.

## 2022-04-07 NOTE — Telephone Encounter (Signed)
Copied from [place]. Topic: Referral - Request for Referral ?>> Apr 07, 2022  3:33 PM Leward Quan A wrote: ?Has patient seen PCP for this complaint? Yes.   ?*If NO, is insurance requiring patient see PCP for this issue before PCP can refer them? ?Referral for which specialty: Psychiatrist  ?Preferred provider/office: [facility] [place] [facility] [place] moving in a few months  ?Reason for referral: Moving to [place] ?

## 2022-04-07 NOTE — Telephone Encounter (Signed)
Referral has been placed. 

## 2022-04-08 ENCOUNTER — Ambulatory Visit: Payer: Medicaid Other | Admitting: Physical Therapy

## 2022-04-14 ENCOUNTER — Ambulatory Visit: Payer: Medicaid Other | Attending: Family Medicine | Admitting: Family Medicine

## 2022-04-14 ENCOUNTER — Encounter: Payer: Self-pay | Admitting: Family Medicine

## 2022-04-14 ENCOUNTER — Telehealth: Payer: Self-pay

## 2022-04-14 VITALS — BP 134/83 | HR 95 | Ht 65.0 in | Wt 145.0 lb

## 2022-04-14 DIAGNOSIS — K5909 Other constipation: Secondary | ICD-10-CM

## 2022-04-14 DIAGNOSIS — J302 Other seasonal allergic rhinitis: Secondary | ICD-10-CM | POA: Diagnosis not present

## 2022-04-14 DIAGNOSIS — S40022A Contusion of left upper arm, initial encounter: Secondary | ICD-10-CM

## 2022-04-14 DIAGNOSIS — C50011 Malignant neoplasm of nipple and areola, right female breast: Secondary | ICD-10-CM

## 2022-04-14 DIAGNOSIS — Z13220 Encounter for screening for lipoid disorders: Secondary | ICD-10-CM | POA: Insufficient documentation

## 2022-04-14 DIAGNOSIS — Z853 Personal history of malignant neoplasm of breast: Secondary | ICD-10-CM | POA: Diagnosis not present

## 2022-04-14 DIAGNOSIS — K5900 Constipation, unspecified: Secondary | ICD-10-CM | POA: Insufficient documentation

## 2022-04-14 DIAGNOSIS — Z923 Personal history of irradiation: Secondary | ICD-10-CM | POA: Insufficient documentation

## 2022-04-14 DIAGNOSIS — Z79899 Other long term (current) drug therapy: Secondary | ICD-10-CM | POA: Diagnosis not present

## 2022-04-14 DIAGNOSIS — Z9012 Acquired absence of left breast and nipple: Secondary | ICD-10-CM | POA: Diagnosis not present

## 2022-04-14 DIAGNOSIS — C50012 Malignant neoplasm of nipple and areola, left female breast: Secondary | ICD-10-CM

## 2022-04-14 DIAGNOSIS — Z87891 Personal history of nicotine dependence: Secondary | ICD-10-CM | POA: Insufficient documentation

## 2022-04-14 DIAGNOSIS — Z85830 Personal history of malignant neoplasm of bone: Secondary | ICD-10-CM | POA: Insufficient documentation

## 2022-04-14 DIAGNOSIS — Z131 Encounter for screening for diabetes mellitus: Secondary | ICD-10-CM | POA: Insufficient documentation

## 2022-04-14 DIAGNOSIS — R269 Unspecified abnormalities of gait and mobility: Secondary | ICD-10-CM | POA: Diagnosis not present

## 2022-04-14 DIAGNOSIS — R531 Weakness: Secondary | ICD-10-CM | POA: Diagnosis not present

## 2022-04-14 MED ORDER — LORATADINE 10 MG PO TABS: 10.0000 mg | ORAL_TABLET | Freq: Every day | ORAL | 1 refills | Status: AC

## 2022-04-14 MED ORDER — LUBIPROSTONE 24 MCG PO CAPS: 24.0000 ug | ORAL_CAPSULE | Freq: Every day | ORAL | 1 refills | Status: AC

## 2022-04-14 NOTE — Telephone Encounter (Signed)
Pt called and requested to speak with Val regarding oncology and [facility] referral for [place], [place]. Attempted to return pt's call and LVM for call back.  ?

## 2022-04-14 NOTE — Progress Notes (Signed)
Subjective:  Patient ID: [name], female    DOB: [birth year]  Age: 61 y.o. MRN: [id number]  CC: Constipation   HPI [name] is a 60 y.o. 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  She was placed on Amitiza at that visit.  This has been ineffective and so has MiraLAX and other OTC laxatives.  She endorses having a poor diet that does not include intake of fiber.   Last seen by oncology in 02/2022 and notes reviewed.  No clinical or radiological signs of progression of metastatic breast cancer and she will continue anastrozole and Herceptin. PET scan 02/2022 revealed: IMPRESSION: 1. No PET-CT findings to suggest active metastatic disease. No pulmonary nodules or enlarged or hypermetabolic lymphadenopathy in the neck, chest, abdomen or pelvis. No hepatic or adrenal gland lesions. 2. Stable mixed lytic and sclerotic changes in the T9 vertebral body but no hypermetabolic bone lesions to suggest active metastatic disease.   She is moving to [facility] assisted [facility] at the end of this month so she can get rehab and her family is also aware.  She will be transferring her care over there. She continues to have problem with her gait and ambulates with the aid of a walker. Last week she took a fall while trying to pick something up from the ground when she was sitting on the commode and sustained a bruise to her left upper extremity.  Denies pain at the moment. Bipolar disorder is managed by mental health Past Medical History:  Diagnosis Date    Alcohol abuse    none since 2013   Anemia    during chemo   Anxiety    At age 47   Arthritis Dx 2010   Bipolar disorder (HCC)    Breast cancer (HCC)    Bronchitis    Cancer (HCC)    breast mets to brain   CHF (congestive heart failure) (HCC)    Chronic pain    resolved per patient 12/25/19   Complication of anesthesia    Depression    Family history of adverse reaction to anesthesia    MOther had PONV   GERD (gastroesophageal reflux disease)    Headache    hx  migraines   HLD (hyperlipidemia)    Hypertension    Lymphedema of left arm    Opiate dependence (HCC)    PONV (postoperative nausea and vomiting)    Port-A-Cath in place    PTSD (post-traumatic stress disorder)    S/P endometrial ablation    in md's office    Past Surgical History:  Procedure Laterality Date   APPLICATION OF CRANIAL NAVIGATION N/A 08/14/2016   Procedure: APPLICATION OF CRANIAL NAVIGATION;  Surgeon: Maeola Harman, MD;  Location: [facility];  Service: Neurosurgery;  Laterality: N/A;   BREAST RECONSTRUCTION Left    with silicone implant   COLONOSCOPY W/ POLYPECTOMY     CRANIOTOMY N/A 08/14/2016   Procedure: CRANIOTOMY TUMOR EXCISION WITH Normajean Glasgow;  Surgeon: Maeola Harman, MD;  Location: [facility];  Service: Neurosurgery;  Laterality: N/A;   FIBULA FRACTURE SURGERY  Left    MASTECTOMY Left    RADIOLOGY WITH ANESTHESIA N/A 07/23/2016   Procedure: MRI OF BRAIN WITH AND WITHOUT;  Surgeon: Medication Radiologist, MD;  Location: [facility];  Service: Radiology;  Laterality: N/A;   RADIOLOGY WITH ANESTHESIA N/A 09/08/2016   Procedure: MRI OF BRAIN WITH AND WITHOUT CONTRAST;  Surgeon: Medication Radiologist, MD;  Location: [facility];  Service: Radiology;  Laterality: N/A;   RADIOLOGY WITH ANESTHESIA N/A 12/10/2016   Procedure: MRI OF BRAIN WITH AND WITHOUT;  Surgeon: Medication Radiologist, MD;  Location: [facility];  Service: Radiology;  Laterality: N/A;   RADIOLOGY WITH ANESTHESIA N/A 03/02/2017   Procedure: MRI of BRAIN  W and W/OUT CONTRAST;  Surgeon: Medication Radiologist, MD;  Location: [facility];  Service: Radiology;  Laterality: N/A;   RADIOLOGY WITH ANESTHESIA N/A 07/29/2017   Procedure: RADIOLOGY WITH ANESTHESIA MRI OF BRAIN WITH AND WITHOUT CONTRAST;  Surgeon: Radiologist, Medication, MD;  Location: [facility];  Service: Radiology;  Laterality: N/A;   RADIOLOGY WITH ANESTHESIA N/A 12/07/2017   Procedure: MRI WITH ANESTHESIA OF BRAIN WITH AND WITHOUT CONTRAST;  Surgeon: Radiologist, Medication, MD;  [facility]: [facility];  Service: Radiology;  Laterality: N/A;   RADIOLOGY WITH ANESTHESIA N/A 04/07/2018   Procedure: MRI OF BRAIN WITH AND WITHOUT CONTRAST;  Surgeon: Radiologist, Medication, MD;  Location: [facility];  Service: Radiology;  Laterality: N/A;   RADIOLOGY WITH ANESTHESIA N/A 08/23/2018   Procedure: MRI WITH ANESTHESIA OF THE BRAIN WITH AND WITHOUT;  Surgeon: Radiologist, Medication, MD;  [facility]: [facility];  Service: Radiology;  Laterality: N/A;   RADIOLOGY WITH ANESTHESIA N/A 01/24/2019   Procedure: MRI OF BRAIN WITH AND WITHOUT CONTRAST;  Surgeon: Radiologist, Medication, MD;  [facility];  Service: Radiology;  Laterality: N/A;   RADIOLOGY WITH ANESTHESIA N/A 07/06/2019   Procedure: MRI WITH ANESTHESIA OF BRAIN WITH AND WITHOUT CONTRAST;  Surgeon: Radiologist, Medication, MD;  [facility];  Service: Radiology;  Laterality: N/A;   RADIOLOGY WITH ANESTHESIA N/A 01/11/2020   Procedure: MRI WITH ANESTHESIA BRAIN WITH AND WITHOUT;  Surgeon: Radiologist, Medication, MD;  [facility];  Service: Radiology;  Laterality: N/A;   RADIOLOGY WITH ANESTHESIA N/A 05/21/2020   Procedure: MRI WITH ANESTHESIA OF BRAIN WITH AND WITHOUT CONTRAST;  Surgeon: Radiologist, Medication, MD;  [facility];  Service: Radiology;  Laterality: N/A;   RADIOLOGY WITH ANESTHESIA N/A 01/02/2021   Procedure: MRI WITH ANESTHESIA  BRAIN WITH AND WITHOUT CONTRAST,TOTAL SPINE MET SCREENING;  Surgeon: Radiologist, Medication, MD;  [facility]: [facility];  Service:  Radiology;  Laterality: N/A;   RADIOLOGY WITH ANESTHESIA N/A 10/07/2021   Procedure: MRI BRAIN WITH AND WITHOUT CONTRAST; MRI TOTAL SPINE;  METS SCREENING WITH ANESTHESIA;  Surgeon: Radiologist, Medication, MD;  Location: [facility];  Service: Radiology;  Laterality: N/A;   right power port placement Right     Family History  Problem Relation Age of Onset   Diabetes Mother    Bipolar disorder Mother    CAD Father    Breast cancer Neg Hx     Social History   Socioeconomic History   Marital status: Single    Spouse name: Not on file   Number of children: Not on file   Years of education: Not on file   Highest education level: Not on file  Occupational History   Not on file  Tobacco Use   Smoking status: Former    Packs/day: 1.00    Years: 43.00    Pack years: 43.00  Types: Cigarettes   Smokeless tobacco: Never   Tobacco comments:    3 day since last cigarette  Vaping Use   Vaping Use: Some days  Substance and Sexual Activity   Alcohol use: No    Comment: no ETOH since 08/22/12   Drug use: No    Comment: states she's in recovery program for 7 years   Sexual activity: Never    Birth control/protection: Other-see comments    Comment: ablation  Other Topics Concern   Not on file  Social History Narrative   Not on file   Social Determinants of Health   Financial Resource Strain: Not on file  Food Insecurity: Not on file  Transportation Needs: Not on file  Physical Activity: Not on file  Stress: Not on file  Social Connections: Not on file    Allergies  Allergen Reactions   Demerol [Meperidine Hcl] Itching and Nausea And Vomiting    INTOLERANCE >  N & V   Erythromycin Rash    Outpatient Medications Prior to Visit  Medication Sig Dispense Refill   acetaminophen (TYLENOL) 500 MG tablet Take 1 tablet (500 mg total) by mouth 2 (two) times daily as needed. Take with naproxen 250 mg tablet (Patient taking differently: Take 500 mg by mouth 2 (two) times daily. Take with  naproxen 250 mg tablet) 180 tablet 4   anastrozole (ARIMIDEX) 1 MG tablet Take 1 tablet (1 mg total) by mouth daily. 90 tablet 4   azelastine (ASTELIN) 0.1 % nasal spray Place 2 sprays into both nostrils daily. Use in each nostril as directed (Patient taking differently: Place 1 spray into both nostrils daily. Use in each nostril as directed) 30 mL 12   carvedilol (COREG) 3.125 MG tablet TAKE 1 TABLET (3.125 MG TOTAL) BY MOUTH 2 TIMES DAILY FOR 10 DAYS, THEN 2 TABLETS 2 TIMES DAILY. 120 tablet 3   cholecalciferol (VITAMIN D3) 25 MCG (1000 UNIT) tablet Take 2 tablets (2,000 Units total) by mouth daily. (Patient taking differently: Take 5,000 Units by mouth daily.) 90 tablet 4   cromolyn (OPTICROM) 4 % ophthalmic solution Place 1 drop into both eyes daily.     fluticasone (FLONASE) 50 MCG/ACT nasal spray SPRAY 2 SPRAYS INTO EACH NOSTRIL EVERY DAY 16 mL 0   gabapentin (NEURONTIN) 300 MG capsule Take 300-600 mg by mouth See admin instructions. Take 300 mg in the morning and 600 mg at bedtime     lamoTRIgine (LAMICTAL) 100 MG tablet Take 100 mg by mouth every morning.     lidocaine (XYLOCAINE) 2 % solution 5mL swish and swallow every 4 hours as needed for sore throat 100 mL 0   Lurasidone HCl 60 MG TABS Take 60 mg by mouth daily.     naproxen (NAPROSYN) 250 MG tablet Take 1 tablet (250 mg total) by mouth 2 (two) times daily as needed. Take together with tylenol 500 mg tablet (Patient taking differently: Take 250 mg by mouth 2 (two) times daily. Take together with tylenol 500 mg tablet) 180 tablet 4   ondansetron (ZOFRAN) 8 MG tablet TAKE 1 TABLET BY MOUTH EVERY 8 HOURS AS NEEDED FOR NAUSEA AND VOMITING 90 tablet 1   pantoprazole (PROTONIX) 40 MG tablet TAKE 1 TABLET BY MOUTH EVERY DAY 90 tablet 3   polyethylene glycol (MIRALAX / GLYCOLAX) 17 g packet Take 17 g by mouth daily as needed for mild constipation. (Patient taking differently: Take 17 g by mouth every 3 (three) days.) 30 each 6   rosuvastatin  (  CRESTOR) 10 MG tablet TAKE 1 TABLET BY MOUTH EVERY DAY 90 tablet 3   traZODone (DESYREL) 100 MG tablet Take 1 tablet (100 mg total) by mouth at bedtime. (Patient taking differently: Take 200-300 mg by mouth at bedtime.) 30 tablet 0   vitamin C (ASCORBIC ACID) 500 MG tablet Take 500 mg by mouth daily.     AMITIZA 8 MCG capsule PLEASE SEE ATTACHED FOR DETAILED DIRECTIONS 60 capsule 0   loratadine (CLARITIN) 10 MG tablet TAKE 1 TABLET BY MOUTH EVERY DAY 90 tablet 3   No facility-administered medications prior to visit.     ROS Review of Systems  Constitutional:  Negative for activity change and appetite change.  HENT:  Negative for sinus pressure and sore throat.   Respiratory:  Negative for chest tightness, shortness of breath and wheezing.   Cardiovascular:  Negative for chest pain and palpitations.  Gastrointestinal:  Positive for constipation. Negative for abdominal distention and abdominal pain.  Genitourinary: Negative.   Musculoskeletal:  Positive for gait problem.  Neurological:  Positive for weakness.  Psychiatric/Behavioral:  Negative for behavioral problems and dysphoric mood.    Objective:  BP 134/83   Pulse 95   Ht 5\' 5"  (1.651 m)   Wt 145 lb (65.8 kg)   LMP  (LMP Unknown) Comment: ablation  SpO2 99%   BMI 24.13 kg/m      04/14/2022   10:32 AM 04/02/2022    9:53 AM 03/30/2022   12:00 PM  BP/Weight  Systolic BP 134 119 121  Diastolic BP 83 81 78  Wt. (Lbs) 145  137.75  BMI 24.13 kg/m2  22.92 kg/m2      Physical Exam Constitutional:      Appearance: She is well-developed.  Cardiovascular:     Rate and Rhythm: Normal rate.     Heart sounds: Normal heart sounds. No murmur heard. Pulmonary:     Effort: Pulmonary effort is normal.     Breath sounds: Normal breath sounds. No wheezing or rales.  Chest:     Chest wall: No tenderness.  Abdominal:     General: Bowel sounds are normal. There is no distension.     Palpations: Abdomen is soft. There is no mass.      Tenderness: There is no abdominal tenderness.  Musculoskeletal:        General: Swelling (LUE) present. Normal range of motion.     Right lower leg: No edema.     Left lower leg: No edema.  Skin:    Findings: Bruising (L thenar emminence, L lateral forearm) present.  Neurological:     Mental Status: She is alert and oriented to person, place, and time.     Motor: Weakness present.     Gait: Gait abnormal.  Psychiatric:        Mood and Affect: Mood normal.       Latest Ref Rng & Units 03/30/2022   11:55 AM 03/05/2022    1:03 PM 02/12/2022   11:25 AM  CMP  Glucose 70 - 99 mg/dL 95   95   97    BUN 6 - 20 mg/dL 15   12   15     Creatinine 0.44 - 1.00 mg/dL 7.82   9.56   2.13    Sodium 135 - 145 mmol/L 139   141   140    Potassium 3.5 - 5.1 mmol/L 4.2   3.8   3.8    Chloride 98 - 111 mmol/L 102  103   100    CO2 22 - 32 mmol/L 34   34   33    Calcium 8.9 - 10.3 mg/dL 01.0   9.7   27.2    Total Protein 6.5 - 8.1 g/dL 7.0   6.4   7.0    Total Bilirubin 0.3 - 1.2 mg/dL 0.3   0.2   0.3    Alkaline Phos 38 - 126 U/L 99   93   88    AST 15 - 41 U/L 25   41   21    ALT 0 - 44 U/L 16   37   12      Lipid Panel     Component Value Date/Time   CHOL 146 10/16/2020 0848   TRIG 91 10/16/2020 0848   HDL 57 10/16/2020 0848   CHOLHDL 2.6 10/16/2020 0848   VLDL 18 10/16/2020 0848   LDLCALC 71 10/16/2020 0848    CBC    Component Value Date/Time   WBC 4.4 03/30/2022 1155   WBC 3.8 (L) 03/27/2021 0941   RBC 3.69 (L) 03/30/2022 1155   HGB 11.2 (L) 03/30/2022 1155   HGB 12.1 10/26/2017 1038   HCT 33.9 (L) 03/30/2022 1155   HCT 35.7 10/26/2017 1038   PLT 138 (L) 03/30/2022 1155   PLT 167 10/26/2017 1038   MCV 91.9 03/30/2022 1155   MCV 98.4 10/26/2017 1038   MCH 30.4 03/30/2022 1155   MCHC 33.0 03/30/2022 1155   RDW 11.9 03/30/2022 1155   RDW 13.3 10/26/2017 1038   LYMPHSABS 1.1 03/30/2022 1155   LYMPHSABS 0.5 (L) 10/26/2017 1038   MONOABS 0.3 03/30/2022 1155   MONOABS 0.1  10/26/2017 1038   EOSABS 0.0 03/30/2022 1155   EOSABS 0.0 10/26/2017 1038   BASOSABS 0.0 03/30/2022 1155   BASOSABS 0.0 10/26/2017 1038    Lab Results  Component Value Date   HGBA1C 5.1 12/10/2017    Assessment & Plan:  1. Other constipation Uncontrolled Advised to increase fiber intake We will increase Amitiza dose - lubiprostone (AMITIZA) 24 MCG capsule; Take 1 capsule (24 mcg total) by mouth daily with breakfast.  Dispense: 90 capsule; Refill: 1  2. Seasonal allergic rhinitis, unspecified trigger Stable - loratadine (CLARITIN) 10 MG tablet; Take 1 tablet (10 mg total) by mouth daily.  Dispense: 90 tablet; Refill: 1  3. Encounter for lipid screening for cardiovascular disease - LP+Non-HDL Cholesterol  4. Screening for diabetes mellitus - Hemoglobin A1c  5. Bilateral malignant neoplasm involving both nipple and areola in female, unspecified estrogen receptor status (HCC) With bony and CNS metastasis See history of treatment with oncology in chart Currently on anastrozole, Herceptin Follow-up imaging in 6 months per oncology She will be establishing with a new oncologist when she gets to [place], [place]  6. Arm bruise, left, initial encounter No evidence of fracture Advised to apply ice to bruised   Health Care Maintenance: Last Pap smear was in 01/2021, mammogram not due until 06/2022, colonoscopy not due until 2025 Meds ordered this encounter  Medications   lubiprostone (AMITIZA) 24 MCG capsule    Sig: Take 1 capsule (24 mcg total) by mouth daily with breakfast.    Dispense:  90 capsule    Refill:  1    Dose increase   loratadine (CLARITIN) 10 MG tablet    Sig: Take 1 tablet (10 mg total) by mouth daily.    Dispense:  90 tablet    Refill:  1  Follow-up: Return if symptoms worsen or fail to improve.  She will be establishing care with a new primary care physician in [place], [place]     Hoy Register, MD, FAAFP. [facility]  [place], [place] [phone]   04/14/2022, 12:02 PM

## 2022-04-14 NOTE — Patient Instructions (Signed)

## 2022-04-15 ENCOUNTER — Other Ambulatory Visit: Payer: Self-pay | Admitting: *Deleted

## 2022-04-15 DIAGNOSIS — C50011 Malignant neoplasm of nipple and areola, right female breast: Secondary | ICD-10-CM

## 2022-04-15 DIAGNOSIS — Z9012 Acquired absence of left breast and nipple: Secondary | ICD-10-CM

## 2022-04-15 DIAGNOSIS — Z95828 Presence of other vascular implants and grafts: Secondary | ICD-10-CM

## 2022-04-15 DIAGNOSIS — I890 Lymphedema, not elsewhere classified: Secondary | ICD-10-CM

## 2022-04-15 DIAGNOSIS — Z8589 Personal history of malignant neoplasm of other organs and systems: Secondary | ICD-10-CM

## 2022-04-15 DIAGNOSIS — Z923 Personal history of irradiation: Secondary | ICD-10-CM

## 2022-04-15 DIAGNOSIS — Z9889 Other specified postprocedural states: Secondary | ICD-10-CM

## 2022-04-15 LAB — LP+NON-HDL CHOLESTEROL
Cholesterol, Total: 126 mg/dL (ref 100–199)
HDL: 57 mg/dL (ref >39)
LDL Chol Calc (NIH): 54 mg/dL (ref 0–99)
Total Non-HDL-Chol (LDL+VLDL): 69 mg/dL (ref 0–129)
Triglycerides: 77 mg/dL (ref 0–149)
VLDL Cholesterol Cal: 15 mg/dL (ref 5–40)

## 2022-04-15 LAB — HEMOGLOBIN A1C
Est. average glucose Bld gHb Est-mCnc: 111 mg/dL
Hgb A1c MFr Bld: 5.5 % (ref 4.8–5.6)

## 2022-04-17 ENCOUNTER — Inpatient Hospital Stay: Payer: Medicaid Other | Admitting: Hematology and Oncology

## 2022-04-17 ENCOUNTER — Inpatient Hospital Stay: Payer: Medicaid Other

## 2022-04-20 ENCOUNTER — Inpatient Hospital Stay: Payer: Medicaid Other

## 2022-04-20 ENCOUNTER — Other Ambulatory Visit: Payer: Self-pay

## 2022-04-20 ENCOUNTER — Ambulatory Visit: Payer: Medicaid Other

## 2022-04-20 ENCOUNTER — Inpatient Hospital Stay (HOSPITAL_BASED_OUTPATIENT_CLINIC_OR_DEPARTMENT_OTHER): Payer: Medicaid Other | Admitting: Hematology and Oncology

## 2022-04-20 ENCOUNTER — Other Ambulatory Visit: Payer: Medicaid Other

## 2022-04-20 VITALS — BP 148/89 | HR 84 | Temp 97.2°F | Resp 17 | Wt 145.5 lb

## 2022-04-20 DIAGNOSIS — Z8589 Personal history of malignant neoplasm of other organs and systems: Secondary | ICD-10-CM | POA: Diagnosis not present

## 2022-04-20 DIAGNOSIS — Z5112 Encounter for antineoplastic immunotherapy: Secondary | ICD-10-CM | POA: Diagnosis not present

## 2022-04-20 DIAGNOSIS — Z95828 Presence of other vascular implants and grafts: Secondary | ICD-10-CM

## 2022-04-20 DIAGNOSIS — F411 Generalized anxiety disorder: Secondary | ICD-10-CM

## 2022-04-20 DIAGNOSIS — C770 Secondary and unspecified malignant neoplasm of lymph nodes of head, face and neck: Secondary | ICD-10-CM

## 2022-04-20 DIAGNOSIS — C50119 Malignant neoplasm of central portion of unspecified female breast: Secondary | ICD-10-CM

## 2022-04-20 DIAGNOSIS — D696 Thrombocytopenia, unspecified: Secondary | ICD-10-CM

## 2022-04-20 DIAGNOSIS — C50312 Malignant neoplasm of lower-inner quadrant of left female breast: Secondary | ICD-10-CM

## 2022-04-20 LAB — CMP (CANCER CENTER ONLY)
ALT: 15 U/L (ref 0–44)
AST: 26 U/L (ref 15–41)
Albumin: 4.2 g/dL (ref 3.5–5.0)
Alkaline Phosphatase: 98 U/L (ref 38–126)
Anion gap: 6 (ref 5–15)
BUN: 10 mg/dL (ref 8–23)
CO2: 32 mmol/L (ref 22–32)
Calcium: 9.9 mg/dL (ref 8.9–10.3)
Chloride: 103 mmol/L (ref 98–111)
Creatinine: 1.01 mg/dL — ABNORMAL HIGH (ref 0.44–1.00)
GFR, Estimated: >60 mL/min (ref >60)
Glucose, Bld: 100 mg/dL — ABNORMAL HIGH (ref 70–99)
Potassium: 3.7 mmol/L (ref 3.5–5.1)
Sodium: 141 mmol/L (ref 135–145)
Total Bilirubin: 0.3 mg/dL (ref 0.3–1.2)
Total Protein: 6.8 g/dL (ref 6.5–8.1)

## 2022-04-20 LAB — CBC WITH DIFFERENTIAL (CANCER CENTER ONLY)
Abs Immature Granulocytes: 0.01 10*3/uL (ref 0.00–0.07)
Basophils Absolute: 0 10*3/uL (ref 0.0–0.1)
Basophils Relative: 0 %
Eosinophils Absolute: 0 10*3/uL (ref 0.0–0.5)
Eosinophils Relative: 0 %
HCT: 32.8 % — ABNORMAL LOW (ref 36.0–46.0)
Hemoglobin: 11 g/dL — ABNORMAL LOW (ref 12.0–15.0)
Immature Granulocytes: 0 %
Lymphocytes Relative: 21 %
Lymphs Abs: 1 10*3/uL (ref 0.7–4.0)
MCH: 30.4 pg (ref 26.0–34.0)
MCHC: 33.5 g/dL (ref 30.0–36.0)
MCV: 90.6 fL (ref 80.0–100.0)
Monocytes Absolute: 0.4 10*3/uL (ref 0.1–1.0)
Monocytes Relative: 7 %
Neutro Abs: 3.3 10*3/uL (ref 1.7–7.7)
Neutrophils Relative %: 72 %
Platelet Count: 138 10*3/uL — ABNORMAL LOW (ref 150–400)
RBC: 3.62 MIL/uL — ABNORMAL LOW (ref 3.87–5.11)
RDW: 12 % (ref 11.5–15.5)
WBC Count: 4.7 10*3/uL (ref 4.0–10.5)
nRBC: 0 % (ref 0.0–0.2)

## 2022-04-20 MED ORDER — SODIUM CHLORIDE 0.9% FLUSH
10.0000 mL | INTRAVENOUS | Status: DC | PRN
  Administered 2022-04-20: 10 mL

## 2022-04-20 MED ORDER — SODIUM CHLORIDE 0.9% FLUSH
10.0000 mL | Freq: Once | INTRAVENOUS | Status: AC
  Administered 2022-04-20: 10 mL

## 2022-04-20 MED ORDER — TRASTUZUMAB-DKST CHEMO 150 MG IV SOLR
6.0000 mg/kg | Freq: Once | INTRAVENOUS | Status: AC
  Administered 2022-04-20: 399 mg via INTRAVENOUS
  Filled 2022-04-20: qty 19

## 2022-04-20 MED ORDER — ACETAMINOPHEN 325 MG PO TABS
650.0000 mg | ORAL_TABLET | Freq: Once | ORAL | Status: AC
  Administered 2022-04-20: 650 mg via ORAL
  Filled 2022-04-20: qty 2

## 2022-04-20 MED ORDER — SODIUM CHLORIDE 0.9 % IV SOLN: Freq: Once | INTRAVENOUS | Status: AC

## 2022-04-20 MED ORDER — HEPARIN SOD (PORK) LOCK FLUSH 100 UNIT/ML IV SOLN
500.0000 [IU] | Freq: Once | INTRAVENOUS | Status: AC | PRN
  Administered 2022-04-20: 500 [IU]

## 2022-04-20 NOTE — Progress Notes (Signed)
[place]  [phone] [phone]     ID: [name] DOB: 06-May-1961  MR#: [id number]  CZY#:[id number]  Patient Care Team: Charlott Rakes, MD as PCP - General (Family Medicine) Larey Dresser, MD as PCP - Advanced Heart Failure (Cardiology) Kyung Rudd, MD as Consulting Physician (Radiation Oncology) Erline Levine, MD as Consulting Physician (Neurosurgery) Corena Pilgrim, MD as Consulting Physician (Psychiatry) Mickeal Skinner Acey Lav, MD as Consulting Physician (Psychiatry) Nehemiah Settle, MD as Referring Physician (Plastic Surgery) Irene Limbo, MD as Consulting Physician (Plastic Surgery) Dillingham, Loel Lofty, DO as Attending Physician (Plastic Surgery) Merdis Delay, DMD (Dentistry) Benay Pike, MD as Consulting Physician (Hematology and Oncology)   CHIEF COMPLAINT: Metastatic triple positive breast cancer  CURRENT TREATMENT: trastuzumab [name] 21 days], anastrozole Xgeva held.   INTERVAL HISTORY:  [name] returns today for follow up and treatment of her metastatic triple positive breast cancer.  [name] continues on Herceptin and anastrozole for management of metastatic triple positive breast cancer.   She denies any new health complaints except for falls.  She describes it as her legs gave out, she recently fell in the bathtub and it took her 30 minutes to pull herself up.  No syncope.   She talked to Dr. Mickeal Skinner on the phone and he recommended to repeat an MRI brain. She denies any changes in her breast.  No change in breathing, cough, chest pain or chest pressure.  Rest of the pertinent 10 point ROS reviewed and negative.   COVID 19 VACCINATION STATUS: Status post Pfizer x2 with booster September 2021   BREAST CANCER HISTORY: From the original intake note:  Llana was aware of a "lemon sized lump in" her left axilla for about a year before bringing it to medical attention. By then she had developed left breast and  left axillary swelling (June 2016). She presented to the local emergency room and had a chest CT scan 06/06/2015 which showed a nodule in the left breast measuring 0.9 cm and questionable left axillary adenopathy. She then proceeded to bilateral diagnostic mammography and left breast ultrasonography 06/19/2015. There were no prior films for comparison (last mammography 12 years prior).. The breast density was category C. Mammography showed in the left breast upper inner quadrant a 7 cm area including a small mass and significant pleomorphic calcifications. Ultrasonography defined the mass as measuring 1.2 cm. The left axilla appeared unremarkable. There was significant skin edema.  Biopsy of the left breast mass 06/19/2015 showed ([id number]) an invasive ductal carcinoma, grade 2, estrogen receptor 83% positive, progesterone receptor 26% positive, and HER-2 amplified by immunohistochemstry with a 3+ reading. The patient had biopsies of a separate area in the left breast August of the same year and this showed atypical ductal hyperplasia. (SP [id number]).  Accordingly after appropriate discussion on 08/21/2015 the patient proceeded to left mastectomy with left axillary sentinel lymph node sampling, which, since the lymph nodes were positive, extended to the procedure to left axillary lymph node dissection. The pathology ([id number]) showed an invasive ductal carcinoma, grade 3, measuring in excess of 9 cm. There were also skin satellites, not contiguous with the invasive carcinoma. Margins were clear and ample. There was evidence of lymphovascular invasion. A total of 15 lymph nodes were removed, including 5 sentinel lymph nodes, all of which were positive, so that the final total was 14 out of 15 lymph nodes involved by tumor. There was evidence of extranodal extension. The final pathology was pT4b pN2a, stage IIIB  CA-27-29 and CEA 09/19/2015 were non-informative October 2016.  Unfortunately CT scans of the  chest abdomen and pelvis 09/16/2015 showed bony metastases to the ri/ght scapula, left iliac crest, and also L4 and T-spine. There were questionable liver cysts which on repeat CT scan 03/02/2016 appear to be a little bit more well-defined, possibly a little larger. There were also some possible right upper lobe lung lesions.  Adjuvant treatment consisted of docetaxel, trastuzumab and pertuzumab, with the final (6th) docetaxel dose given 02/11/2016. She continues on trastuzumab and pertuzumab, with the 11th cycle given 05/05/2016. Echocardiogram 02/26/2016 showed an ejection fraction of 55%. She receives denosumab/Xgeva every 4 weeks.. She also receives radiation, started 06/09/201, to be completed 06/26/2016.  Her subsequent history is as detailed below   PAST MEDICAL HISTORY: Past Medical History:  Diagnosis Date  . Alcohol abuse    none since 2013  . Anemia    during chemo  . Anxiety    At age 71  . Arthritis Dx 2010  . Bipolar disorder ([place])   . Breast cancer ([place])   . Bronchitis   . Cancer ([place])    breast mets to brain  . CHF (congestive heart failure) ([place])   . Chronic pain    resolved per patient 12/25/19  . Complication of anesthesia   . Depression   . Family history of adverse reaction to anesthesia    MOther had PONV  . GERD (gastroesophageal reflux disease)   . Headache    hx  migraines  . HLD (hyperlipidemia)   . Hypertension   . Lymphedema of left arm   . Opiate dependence ([place])   . PONV (postoperative nausea and vomiting)   . Port-A-Cath in place   . PTSD (post-traumatic stress disorder)   . S/P endometrial ablation    in md's office    PAST SURGICAL HISTORY: Past Surgical History:  Procedure Laterality Date  . APPLICATION OF CRANIAL NAVIGATION N/A 08/14/2016   Procedure: APPLICATION OF CRANIAL NAVIGATION;  Surgeon: Erline Levine, MD;  Location: [facility];  Service: Neurosurgery;  Laterality: N/A;  . BREAST RECONSTRUCTION Left    with silicone implant   . COLONOSCOPY W/ POLYPECTOMY    . CRANIOTOMY N/A 08/14/2016   Procedure: CRANIOTOMY TUMOR EXCISION WITH Lucky Rathke;  Surgeon: Erline Levine, MD;  Location: [facility];  Service: Neurosurgery;  Laterality: N/A;  . FIBULA FRACTURE SURGERY Left   . MASTECTOMY Left   . RADIOLOGY WITH ANESTHESIA N/A 07/23/2016   Procedure: MRI OF BRAIN WITH AND WITHOUT;  Surgeon: Medication Radiologist, MD;  Location: [facility];  Service: Radiology;  Laterality: N/A;  . RADIOLOGY WITH ANESTHESIA N/A 09/08/2016   Procedure: MRI OF BRAIN WITH AND WITHOUT CONTRAST;  Surgeon: Medication Radiologist, MD;  Location: [facility];  Service: Radiology;  Laterality: N/A;  . RADIOLOGY WITH ANESTHESIA N/A 12/10/2016   Procedure: MRI OF BRAIN WITH AND WITHOUT;  Surgeon: Medication Radiologist, MD;  Location: [facility];  Service: Radiology;  Laterality: N/A;  . RADIOLOGY WITH ANESTHESIA N/A 03/02/2017   Procedure: MRI of BRAIN W and W/OUT CONTRAST;  Surgeon: Medication Radiologist, MD;  Location: [facility];  Service: Radiology;  Laterality: N/A;  . RADIOLOGY WITH ANESTHESIA N/A 07/29/2017   Procedure: RADIOLOGY WITH ANESTHESIA MRI OF BRAIN WITH AND WITHOUT CONTRAST;  Surgeon: Radiologist, Medication, MD;  Location: [facility];  Service: Radiology;  Laterality: N/A;  . RADIOLOGY WITH ANESTHESIA N/A 12/07/2017   Procedure: MRI WITH ANESTHESIA OF BRAIN WITH AND WITHOUT CONTRAST;  Surgeon: Radiologist,  Medication, MD;  Location: [facility];  Service: Radiology;  Laterality: N/A;  . RADIOLOGY WITH ANESTHESIA N/A 04/07/2018   Procedure: MRI OF BRAIN WITH AND WITHOUT CONTRAST;  Surgeon: Radiologist, Medication, MD;  Location: [facility];  Service: Radiology;  Laterality: N/A;  . RADIOLOGY WITH ANESTHESIA N/A 08/23/2018   Procedure: MRI WITH ANESTHESIA OF THE BRAIN WITH AND WITHOUT;  Surgeon: Radiologist, Medication, MD;  Location: [facility];  Service: Radiology;  Laterality: N/A;  . RADIOLOGY WITH ANESTHESIA N/A 01/24/2019   Procedure: MRI OF BRAIN WITH AND WITHOUT CONTRAST;   Surgeon: Radiologist, Medication, MD;  Location: [facility];  Service: Radiology;  Laterality: N/A;  . RADIOLOGY WITH ANESTHESIA N/A 07/06/2019   Procedure: MRI WITH ANESTHESIA OF BRAIN WITH AND WITHOUT CONTRAST;  Surgeon: Radiologist, Medication, MD;  Location: [facility];  Service: Radiology;  Laterality: N/A;  . RADIOLOGY WITH ANESTHESIA N/A 01/11/2020   Procedure: MRI WITH ANESTHESIA BRAIN WITH AND WITHOUT;  Surgeon: Radiologist, Medication, MD;  Location: [facility];  Service: Radiology;  Laterality: N/A;  . RADIOLOGY WITH ANESTHESIA N/A 05/21/2020   Procedure: MRI WITH ANESTHESIA OF BRAIN WITH AND WITHOUT CONTRAST;  Surgeon: Radiologist, Medication, MD;  Location: [facility];  Service: Radiology;  Laterality: N/A;  . RADIOLOGY WITH ANESTHESIA N/A 01/02/2021   Procedure: MRI WITH ANESTHESIA  BRAIN WITH AND WITHOUT CONTRAST,TOTAL SPINE MET SCREENING;  Surgeon: Radiologist, Medication, MD;  Location: [facility];  Service: Radiology;  Laterality: N/A;  . RADIOLOGY WITH ANESTHESIA N/A 10/07/2021   Procedure: MRI BRAIN WITH AND WITHOUT CONTRAST; MRI TOTAL SPINE;  METS SCREENING WITH ANESTHESIA;  Surgeon: Radiologist, Medication, MD;  Location: [facility];  Service: Radiology;  Laterality: N/A;  . right power port placement Right     FAMILY HISTORY Family History  Problem Relation Age of Onset  . Diabetes Mother   . Bipolar disorder Mother   . CAD Father   . Breast cancer Neg Hx   The patient's father still living, age 45, in [place]. He had prostate cancer at some point in the past. The patient's mother died at age 25 from complications of diabetes. The patient had no brothers, 2 sisters. A paternal grandmother had lung cancer in the setting of tobacco abuse. There is no other history of cancer in the family to her knowledge   GYNECOLOGIC HISTORY:  No LMP recorded. Patient has had an ablation. Menarche approximately age 72. First live birth in 39. The patient is GX P2. She underwent endometrial ablation in  2016.   SOCIAL HISTORY: (Updated April 2022) The patient is originally from [place]. She moved to this area from [place] because it was less expensive here she says.   She now has has her own 1 bedroom apartment on [address].  She is divorced. Her 2 children are [name] who lives in [place] and works as a Development worker, community, and Bowie who lives in [place] and works as Freight forwarder of a healthcare facility.  [name] has 2 children, Interior and spatial designer and Grayce Sessions.  He married in 2022, his wife Minette Brine, has 2 children of her own plus a step grandson).  [name] has no children.    ADVANCED DIRECTIVES: Not in place; at the 06/03/2016 visit the patient was given the appropriate forms to complete and notarize at her discretion   HEALTH MAINTENANCE: Social History   Tobacco Use  . Smoking status: Former    Packs/day: 1.00    Years: 43.00    Pack years: 43.00    Types: Cigarettes  . Smokeless tobacco: Never  .  Tobacco comments:    3 day since last cigarette  Vaping Use  . Vaping Use: Some days  Substance Use Topics  . Alcohol use: No    Comment: no ETOH since 08/22/12  . Drug use: No    Comment: states she's in recovery program for 7 years     Colonoscopy:  PAP:  Bone density:   Allergies  Allergen Reactions  . Demerol [Meperidine Hcl] Itching and Nausea And Vomiting    INTOLERANCE >  N & V  . Erythromycin Rash    Current Outpatient Medications on File Prior to Visit  Medication Sig Dispense Refill  . acetaminophen (TYLENOL) 500 MG tablet Take 1 tablet (500 mg total) by mouth 2 (two) times daily as needed. Take with naproxen 250 mg tablet (Patient taking differently: Take 500 mg by mouth 2 (two) times daily. Take with naproxen 250 mg tablet) 180 tablet 4  . anastrozole (ARIMIDEX) 1 MG tablet Take 1 tablet (1 mg total) by mouth daily. 90 tablet 4  . azelastine (ASTELIN) 0.1 % nasal spray Place 2 sprays into both nostrils daily. Use in each nostril as directed (Patient taking differently: Place  1 spray into both nostrils daily. Use in each nostril as directed) 30 mL 12  . carvedilol (COREG) 3.125 MG tablet TAKE 1 TABLET (3.125 MG TOTAL) BY MOUTH 2 TIMES DAILY FOR 10 DAYS, THEN 2 TABLETS 2 TIMES DAILY. 120 tablet 3  . cholecalciferol (VITAMIN D3) 25 MCG (1000 UNIT) tablet Take 2 tablets (2,000 Units total) by mouth daily. (Patient taking differently: Take 5,000 Units by mouth daily.) 90 tablet 4  . cromolyn (OPTICROM) 4 % ophthalmic solution Place 1 drop into both eyes daily.    . fluticasone (FLONASE) 50 MCG/ACT nasal spray SPRAY 2 SPRAYS INTO EACH NOSTRIL EVERY DAY 16 mL 0  . gabapentin (NEURONTIN) 300 MG capsule Take 300-600 mg by mouth See admin instructions. Take 300 mg in the morning and 600 mg at bedtime    . lamoTRIgine (LAMICTAL) 100 MG tablet Take 100 mg by mouth every morning.    . lidocaine (XYLOCAINE) 2 % solution 47m swish and swallow every 4 hours as needed for sore throat 100 mL 0  . loratadine (CLARITIN) 10 MG tablet Take 1 tablet (10 mg total) by mouth daily. 90 tablet 1  . lubiprostone (AMITIZA) 24 MCG capsule Take 1 capsule (24 mcg total) by mouth daily with breakfast. 90 capsule 1  . Lurasidone HCl 60 MG TABS Take 60 mg by mouth daily.    . naproxen (NAPROSYN) 250 MG tablet Take 1 tablet (250 mg total) by mouth 2 (two) times daily as needed. Take together with tylenol 500 mg tablet (Patient taking differently: Take 250 mg by mouth 2 (two) times daily. Take together with tylenol 500 mg tablet) 180 tablet 4  . ondansetron (ZOFRAN) 8 MG tablet TAKE 1 TABLET BY MOUTH EVERY 8 HOURS AS NEEDED FOR NAUSEA AND VOMITING 90 tablet 1  . pantoprazole (PROTONIX) 40 MG tablet TAKE 1 TABLET BY MOUTH EVERY DAY 90 tablet 3  . polyethylene glycol (MIRALAX / GLYCOLAX) 17 g packet Take 17 g by mouth daily as needed for mild constipation. (Patient taking differently: Take 17 g by mouth every 3 (three) days.) 30 each 6  . rosuvastatin (CRESTOR) 10 MG tablet TAKE 1 TABLET BY MOUTH EVERY DAY 90  tablet 3  . traZODone (DESYREL) 100 MG tablet Take 1 tablet (100 mg total) by mouth at bedtime. (Patient taking differently: Take  200-300 mg by mouth at bedtime.) 30 tablet 0  . vitamin C (ASCORBIC ACID) 500 MG tablet Take 500 mg by mouth daily.     No current facility-administered medications on file prior to visit.    OBJECTIVE: White woman in no acute distress  Vitals:   04/20/22 1327  BP: (!) 148/89  Pulse: 84  Resp: 17  Temp: (!) 97.2 F (36.2 C)  TempSrc: Temporal  SpO2: 97%  Weight: 145 lb 8 oz (66 kg)  Body mass index is 24.21 kg/m.  ECOG FS: 2   GENERAL: Patient appears chronically ill, is in a wheelchair and is in no acute distress. Physical Exam Constitutional:      Appearance: Normal appearance.  Cardiovascular:     Rate and Rhythm: Normal rate and regular rhythm.     Pulses: Normal pulses.     Heart sounds: Normal heart sounds.  Pulmonary:     Effort: Pulmonary effort is normal. No respiratory distress.     Breath sounds: Normal breath sounds.  Abdominal:     General: Abdomen is flat. Bowel sounds are normal.  Musculoskeletal:     Cervical back: Normal range of motion and neck supple. No rigidity.     Comments: She walks in small steps, limited range of motion  Lymphadenopathy:     Cervical: No cervical adenopathy.  Skin:    General: Skin is warm and dry.  Neurological:     General: No focal deficit present.     Mental Status: She is alert.  Psychiatric:        Mood and Affect: Mood normal.     LAB RESULTS: No results found for: LABCA2  CBC    Component Value Date/Time   WBC 4.7 04/20/2022 1256   WBC 3.8 (L) 03/27/2021 0941   RBC 3.62 (L) 04/20/2022 1256   HGB 11.0 (L) 04/20/2022 1256   HGB 12.1 10/26/2017 1038   HCT 32.8 (L) 04/20/2022 1256   HCT 35.7 10/26/2017 1038   PLT 138 (L) 04/20/2022 1256   PLT 167 10/26/2017 1038   MCV 90.6 04/20/2022 1256   MCV 98.4 10/26/2017 1038   MCH 30.4 04/20/2022 1256   MCHC 33.5 04/20/2022 1256    RDW 12.0 04/20/2022 1256   RDW 13.3 10/26/2017 1038   LYMPHSABS 1.0 04/20/2022 1256   LYMPHSABS 0.5 (L) 10/26/2017 1038   MONOABS 0.4 04/20/2022 1256   MONOABS 0.1 10/26/2017 1038   EOSABS 0.0 04/20/2022 1256   EOSABS 0.0 10/26/2017 1038   BASOSABS 0.0 04/20/2022 1256   BASOSABS 0.0 10/26/2017 1038      Latest Ref Rng & Units 04/20/2022   12:56 PM 03/30/2022   11:55 AM 03/05/2022    1:03 PM  CMP  Glucose 70 - 99 mg/dL 100   95   95    BUN 8 - 23 mg/dL 10   15   12     Creatinine 0.44 - 1.00 mg/dL 1.01   1.06   0.98    Sodium 135 - 145 mmol/L 141   139   141    Potassium 3.5 - 5.1 mmol/L 3.7   4.2   3.8    Chloride 98 - 111 mmol/L 103   102   103    CO2 22 - 32 mmol/L 32   34   34    Calcium 8.9 - 10.3 mg/dL 9.9   10.3   9.7    Total Protein 6.5 - 8.1 g/dL 6.8   7.0  6.4    Total Bilirubin 0.3 - 1.2 mg/dL 0.3   0.3   0.2    Alkaline Phos 38 - 126 U/L 98   99   93    AST 15 - 41 U/L 26   25   41    ALT 0 - 44 U/L 15   16   37      STUDIES: No results found.   ELIGIBLE FOR AVAILABLE RESEARCH PROTOCOL: no  ASSESSMENT: 61 y.o. [place] woman with stage IV left-sided breast cancer involving bone and central nervous system  (1) s/p left breast lower inner quadrant biopsy 06/19/2015 for a clinical T2-3 NX invasive ductal carcinoma, grade 2, triple positive.  (2) status post left mastectomy and axillary lymph node dissection  with immediate expander placement 07/18/2015 for an mpT4 pN2,stage IIIB invasive ductal carcinoma, grade 3, with negative margins.  (a) definitive implant exchange to be scheduled in December   METASTATIC DISEASE: October 2016  (3) CT scan of the chest abdomen and pelvis  09/16/2015 shows metastatic lesions in the right scapula, left iliac crest, L4, and T spine. There were questionable liver cysts, with repeat CT scan 03/02/2016 showing possible right upper lobe lung lesions and possibly increased liver lesions  (a) CT scan of the chest 06/17/2016 shows no  active disease in the lungs or liver  (b) Bone scan July 2017 showed no evidence of bony metastatic disease   (c) head CT 07/08/2016 showed a cerebellar lesion, confirmed by MRI 07/23/2016, status post craniotomy 08/14/2016, confirming a metastatic deposit which was estrogen and progesterone receptor negative, HER-2 amplified with a signals ratio of 7.16, number per cell 13.25  (d) CA 27-29 is not informative  (4) received docetaxel every 3 weeks 6 together with trastuzumab and pertuzumab, last docetaxel dose 02/11/2016  (5) adjuvant radiation7/03/2016 to 06/26/2016 at [facility]: 1. The Left chest wall was treated to 23.4 Gy in 13 fractions at 1.8 Gy per fraction. 2. The Left chest wall was boosted to 10 Gy in 5 fractions at 2 Gy per fraction. 3. The Left Sclav/PAB was treated to 23.4 Gy in 13 fractions at 1.8 Gy per fraction.   [Note: Including the patient's treatment in [place] (received 15 fractions per Dr. Maryan Rued near [place], [place]), the patient received 50.4 Gy to the left chest wall and supraclavicular region. ]  (6) started trastuzumab and pertuzumab October 2016, continuing every 3 weeks,  (a) echocardiogram 02/26/2016 showed a well preserved ejection fraction  (b) echocardiogram 07/01/2016 shows an ejection fraction in the 60-65%   (c) pertuzumab discontinued 10/2016 with uncontrolled diarrhea  (d) echocardiogram 11/11/2016 showed an ejection fraction in the 60-65%  (e) echocardiogram 03/03/2017 shows an ejection fraction of 60-65%  (f) echocardiogram on 05/19/2017 shows an ejection fraction of 55-60%  (g) echocardiogram 09/24/2017 shows the ejection fraction in the 60-65%  (h) echocardiogram 02/14/2018 shows an ejection fraction in the 60-65%  (I) echocardiogram  06/30/2018 shows an ejection fraction in the 55-60%  (m) echocardiogram on 12/08/2018 shows an ejection fraction in the 60-65% range  (7) started denosumab/Xgeva October 2017 given every 4 weeks, transitioned to every 8  weeks beginning 10/11/18 (every 6 weeks while giving TDM1 every 3 weeks)  (a) Xgeva held after 08/21/2020 dose due to dental concerns  (8) started anastrozole October 2017   (a) bone scan 11/10/2016 shows no active disease  (b) chest CT scan 11/10/2016 stable, with no evidence of active disease  (c) chest CT and bone scan 07/02/2017  show no evidence of active disease  (d) CT scan of the chest with contrast 11/10/2017 shows some left axillary edema, but no evidence of thrombosis or adenopathy in that area, 0.9 cm precarinal lymph and 0.7 cm right upper lobe nodule node; bone lesions stable  (e) CT of the chest 05/04/2018 shows a 1.4 cm right lower paratracheal node which is slightly increased and a new right prevascular mediastinal node measuring 0.7 cm.  Bone lesions are stable.  (f) chest CT on 07/01/2018 shows no definite findings of metastatic disease in the thorax. Previously noted borderline enlarged low right paratracheal lymph node is stable to slightly decreased in size   (e) chest CT on 12/30/2018 notes mild increase in right paratracheal adneopathy, recommended PET scan.  Pet scan on 01/19/2019 showed a hypermetabolic and enlarged right paratracheal lymph node consistent with breast cancer recurrence.  (f)Trastuzumab discontinued due to February 2020 scans   (g) TDM-1 started on 01/31/2019 given every 21 days.  (h) Chest CT 05/15/2019 shows decrease in mediastinal adenopathy  (i) CT chest on 08/17/2019 shows resolution of thoracic adenopathy  (j) TDM 1 discontinued after 10/11/2019 dose (8 months treatment) because of thrombocytopenia  (k) trastuzumab resumed 11/01/2019, repeated every 21 days  (l) echocardiogram 12/06/2019 showed an ejection fraction in the 60-65% range  (m) echo 08/12/2021 shows an ejection fraction in the 55-60% range.   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  (a) brain irradiation10/19/17-11/08/17: Whole brain/ 35 Gy in 14 fractions   (b) repeat brain MRI obtained 12/10/2016 shows no active disease in the brain  (c) repeat brain MRI 03/02/2017 shows no evidence of residual or recurrent disease  (d) repeat brain MRI 07/29/2017 shows no evidence of residual or recurrent disease  (e) repeat brain MRI 12/07/2017 shows no evidence of disease recurrence.  There is progressive white matter change secondary to prior treatment.  (f) repeat brain MRI 04/07/2018 showed no evidence of disease\  (g) repeat brain MRI on 08/23/2018 shows no evidence of disease  (h) repeat brain MRI on 01/25/2019 shows no evidence of disease  (I) brain MRI 07/06/2019 shows no evidence of active disease  (j) brain MRI 01/11/2020 shows a subdural hematoma measuring 0.8 cm but no evidence of recurrent metastatic disease  (k) brain MRI 05/21/2020 no evidence of recurrent disease; chronic changes c/w prior treatment and recent subdural (otherwise resolved)  (l) brain MRI 01/02/2021 shows no evidence of recurrent disease.  There is some dural thickening in the area of prior subdural hematoma and continued follow-up is recommended.  (13) peripheral disease monitoring:  (A) chest CT scan, brain CT and bone scan 01/31/2020 show no evidence of active disease  (B) CT chest with contrast 06/10/2020 showed no evidence of actice/progressive disease (C) MRI total spine 01/03/2021 shows no enhancing bony mets, prior sclerotic lesions not well visualized  (D) CT of the chest 01/20/2021 stable, with unchanged bony metastasis, scattered subcentimeter low-attenuation liver lesions too small to characterize, and no measurable disease.  (E) PET scan 08/20/2021 shows no  progression or active disease MRI brain in November 2022 showed stable postop changes from prior left cerebellar metastasis resection, no evidence of  recurrent tumor at the site.  No new intracranial metastasis.  Persistent although decreased smooth dural thickening and enhancement overlying the right parietal lobe likely related to prior subdural hematoma at the site.    MR total spine med screening showed known bony metastasis within the T9 vertebral body with minimal peripheral enhancement unchanged from the prior total spine MRI of 01/02/2021 no MR evidence of new osseous metastasis.  Additional sclerotic lesion seen on prior CT exams are not appreciable by MR modality.  No pathologic fracture or epidural tumor involvement.  Cervical thoracic and lumbar spondylosis as outlined and unchanged.  No more than mild appreciable spinal canal narrowing. PET scan February 27, 2022 with no PET/CT findings to suggest active metastatic disease.  Stable Taken sclerotic changes in the T9 vertebral body but no hypermetabolic bone lesions to suggest active metastatic disease.  PLAN:  [name] has no clinical or radiographic sign of progression of her metastatic breast cancer.   She will continue on anastrozole and Herceptin.  She is due for an echocardiogram, this has been ordered.  No concern for cardiac compromise. We will consider imaging every 6 months unless there are any clinical signs for progression.  She will continue to follow-up with Dr. Mickeal Skinner for her intracranial disease monitoring.   She will return to clinic in approximately 6 weeks.  Continue Arimidex as prescribed.  Delton See held for dental issues.  Total encounter time 30 minutes.* in face to face visit time, chart review, lab review, order entry, care coordination, and documentation of the encounter.  *Total Encounter Time as defined by the Centers for Medicare and Medicaid Services includes, in addition to the face-to-face time of a patient visit  (documented in the note above) non-face-to-face time: obtaining and reviewing outside history, ordering and reviewing medications, tests or procedures, care coordination (communications with other health care professionals or caregivers) and documentation in the medical record.

## 2022-04-20 NOTE — Patient Instructions (Signed)
[facility]  Discharge Instructions: ?Thank you for choosing [facility] to provide your oncology and hematology care.  ? ?If you have a lab appointment with the Cancer Center, please go directly to the Cancer Center and check in at the registration area. ?  ?Wear comfortable clothing and clothing appropriate for easy access to any Portacath or PICC line.  ? ?We strive to give you quality time with your provider. You may need to reschedule your appointment if you arrive late (15 or more minutes).  Arriving late affects you and other patients whose appointments are after yours.  Also, if you miss three or more appointments without notifying the office, you may be dismissed from the clinic at the provider?s discretion.    ?  ?For prescription refill requests, have your pharmacy contact our office and allow 72 hours for refills to be completed.   ? ?Today you received the following chemotherapy and/or immunotherapy agents: Trastuzumab    ?  ?To help prevent nausea and vomiting after your treatment, we encourage you to take your nausea medication as directed. ? ?BELOW ARE SYMPTOMS THAT SHOULD BE REPORTED IMMEDIATELY: ?*FEVER GREATER THAN 100.4 F (38 ?C) OR HIGHER ?*CHILLS OR SWEATING ?*NAUSEA AND VOMITING THAT IS NOT CONTROLLED WITH YOUR NAUSEA MEDICATION ?*UNUSUAL SHORTNESS OF BREATH ?*UNUSUAL BRUISING OR BLEEDING ?*URINARY PROBLEMS (pain or burning when urinating, or frequent urination) ?*BOWEL PROBLEMS (unusual diarrhea, constipation, pain near the anus) ?TENDERNESS IN MOUTH AND THROAT WITH OR WITHOUT PRESENCE OF ULCERS (sore throat, sores in mouth, or a toothache) ?UNUSUAL RASH, SWELLING OR PAIN  ?UNUSUAL VAGINAL DISCHARGE OR ITCHING  ? ?Items with * indicate a potential emergency and should be followed up as soon as possible or go to the Emergency Department if any problems should occur. ? ?Please show the CHEMOTHERAPY ALERT CARD or IMMUNOTHERAPY ALERT CARD at check-in  to the Emergency Department and triage nurse. ? ?Should you have questions after your visit or need to cancel or reschedule your appointment, please contact [facility]  Dept: [phone]  and follow the prompts.  Office hours are 8:00 a.m. to 4:30 p.m. Monday - Friday. Please note that voicemails left after 4:00 p.m. may not be returned until the following business day.  We are closed weekends and major holidays. You have access to a nurse at all times for urgent questions. Please call the main number to the clinic Dept: [phone] and follow the prompts. ? ? ?For any non-urgent questions, you may also contact your provider using MyChart. We now offer e-Visits for anyone 18 and older to request care online for non-urgent symptoms. For details visit [email or web address]. ?  ?Also download the MyChart app! Go to the app store, search "MyChart", open the app, select [facility], and log in with your MyChart username and password. ? ?Due to Covid, a mask is required upon entering the hospital/clinic. If you do not have a mask, one will be given to you upon arrival. For doctor visits, patients may have 1 support person aged 18 or older with them. For treatment visits, patients cannot have anyone with them due to current Covid guidelines and our immunocompromised population.  ? ?

## 2022-04-21 ENCOUNTER — Encounter: Payer: Self-pay | Admitting: Oncology

## 2022-04-21 ENCOUNTER — Encounter: Payer: Self-pay | Admitting: Hematology and Oncology

## 2022-05-04 ENCOUNTER — Telehealth: Payer: Self-pay

## 2022-05-04 NOTE — Telephone Encounter (Signed)
Pt called requesting status of referral to [facility] (oncology). Called pt to let her know that her message would be given to Rhame for follow-up.

## 2022-05-06 ENCOUNTER — Other Ambulatory Visit: Payer: Self-pay | Admitting: *Deleted

## 2022-05-06 DIAGNOSIS — D509 Iron deficiency anemia, unspecified: Secondary | ICD-10-CM

## 2022-05-06 DIAGNOSIS — C50011 Malignant neoplasm of nipple and areola, right female breast: Secondary | ICD-10-CM

## 2022-05-06 DIAGNOSIS — C50312 Malignant neoplasm of lower-inner quadrant of left female breast: Secondary | ICD-10-CM

## 2022-05-06 DIAGNOSIS — C50012 Malignant neoplasm of nipple and areola, left female breast: Secondary | ICD-10-CM

## 2022-05-06 DIAGNOSIS — Z8589 Personal history of malignant neoplasm of other organs and systems: Secondary | ICD-10-CM

## 2022-05-06 DIAGNOSIS — Z95828 Presence of other vascular implants and grafts: Secondary | ICD-10-CM

## 2022-05-06 DIAGNOSIS — Z9012 Acquired absence of left breast and nipple: Secondary | ICD-10-CM

## 2022-05-07 ENCOUNTER — Inpatient Hospital Stay: Payer: Medicaid Other

## 2022-05-11 ENCOUNTER — Telehealth (HOSPITAL_COMMUNITY): Payer: Self-pay | Admitting: *Deleted

## 2022-05-11 ENCOUNTER — Other Ambulatory Visit: Payer: Medicaid Other

## 2022-05-11 ENCOUNTER — Ambulatory Visit: Payer: Medicaid Other

## 2022-05-11 ENCOUNTER — Telehealth: Payer: Self-pay | Admitting: *Deleted

## 2022-05-11 NOTE — Telephone Encounter (Signed)
Contacted by Baker Janus, nurse with [facility]. Requested copy of most recent office visit with Dr. Dr. Chryl Heck and med list faxed to her [phone]. Both docs sent efax - confirmed.

## 2022-05-11 NOTE — Telephone Encounter (Signed)
Pt called stating she moved to [place] an is in an assisted living facility. Pt asked for a referral to a cardiologist at [facility]. Referral faxed to  [phone].

## 2022-05-12 ENCOUNTER — Telehealth: Payer: Self-pay | Admitting: *Deleted

## 2022-05-12 NOTE — Telephone Encounter (Signed)
Contacted by [facility] with specifics for PT/OT per Dr. Rob Hickman order on 05/06/22:  "Under active PT for lymphedema secondary to lymph node dissection s/p mastectomy for breast cancer"  Following specific PT/OT visits to be made by [facility]: Plan is to see patient for following PT visits for weakness of lower extremities and unsteady gait.- Once a week x 4, once every 2 weeks x 2, then once every 3 weeks x 3.  Plan is for patient to be evaluated by OT for left upper extremity management of lymphedema.  Alvis Lemmings will contact insurance for authorization for visits.

## 2022-06-01 ENCOUNTER — Ambulatory Visit: Payer: Medicaid Other

## 2022-06-01 ENCOUNTER — Inpatient Hospital Stay: Payer: Medicaid Other | Attending: Medical | Admitting: Hematology and Oncology

## 2022-06-01 ENCOUNTER — Other Ambulatory Visit: Payer: Medicaid Other

## 2022-06-01 NOTE — Progress Notes (Deleted)
[place]  [phone] [phone]     ID: [name] DOB: March 12, 1961  MR#: [id number]  OBS#:[id number]  Patient Care Team: Charlott Rakes, MD as PCP - General (Family Medicine) Larey Dresser, MD as PCP - Advanced Heart Failure (Cardiology) Kyung Rudd, MD as Consulting Physician (Radiation Oncology) Erline Levine, MD as Consulting Physician (Neurosurgery) Corena Pilgrim, MD as Consulting Physician (Psychiatry) Mickeal Skinner Acey Lav, MD as Consulting Physician (Psychiatry) Nehemiah Settle, MD as Referring Physician (Plastic Surgery) Irene Limbo, MD as Consulting Physician (Plastic Surgery) Dillingham, Loel Lofty, DO as Attending Physician (Plastic Surgery) Merdis Delay, DMD (Dentistry) Benay Pike, MD as Consulting Physician (Hematology and Oncology)   CHIEF COMPLAINT: Metastatic triple positive breast cancer  CURRENT TREATMENT: trastuzumab [name] 21 days], anastrozole Xgeva held.   INTERVAL HISTORY:  [name] returns today for follow up and treatment of her metastatic triple positive breast cancer.  [name] continues on Herceptin and anastrozole for management of metastatic triple positive breast cancer.   She denies any new health complaints except for falls.   She will be moving to [place] to an assisted living facility and will continue rest of the treatment there. She has family support and is excited to move there.  She is however sad to leave [name]. She was recommended to see medical oncology and neuro-oncology at her new facility.  She denies any new health complaints.  No change in breathing, bowel habits or urinary habits.  No chest pain or chest pressure or shortness of breath.  She was in a wheelchair today  Rest of the pertinent 10 point ROS reviewed and negative.   COVID 19 VACCINATION STATUS: Status post Pfizer x2 with booster September 2021   BREAST CANCER HISTORY: From the original intake  note:  [name] was aware of a "lemon sized lump in" her left axilla for about a year before bringing it to medical attention. By then she had developed left breast and left axillary swelling (June 2016). She presented to the local emergency room and had a chest CT scan 06/06/2015 which showed a nodule in the left breast measuring 0.9 cm and questionable left axillary adenopathy. She then proceeded to bilateral diagnostic mammography and left breast ultrasonography 06/19/2015. There were no prior films for comparison (last mammography 12 years prior).. The breast density was category C. Mammography showed in the left breast upper inner quadrant a 7 cm area including a small mass and significant pleomorphic calcifications. Ultrasonography defined the mass as measuring 1.2 cm. The left axilla appeared unremarkable. There was significant skin edema.  Biopsy of the left breast mass 06/19/2015 showed (SP [id number]) an invasive ductal carcinoma, grade 2, estrogen receptor 83% positive, progesterone receptor 26% positive, and HER-2 amplified by immunohistochemstry with a 3+ reading. The patient had biopsies of a separate area in the left breast August of the same year and this showed atypical ductal hyperplasia. (SP [id number]).  Accordingly after appropriate discussion on 08/21/2015 the patient proceeded to left mastectomy with left axillary sentinel lymph node sampling, which, since the lymph nodes were positive, extended to the procedure to left axillary lymph node dissection. The pathology (SP [id number]) showed an invasive ductal carcinoma, grade 3, measuring in excess of 9 cm. There were also skin satellites, not contiguous with the invasive carcinoma. Margins were clear and ample. There was evidence of lymphovascular invasion. A total of 15 lymph nodes were removed, including 5 sentinel lymph nodes, all of which were positive, so that the final total was  14 out of 15 lymph nodes involved by tumor. There was evidence of  extranodal extension. The final pathology was pT4b pN2a, stage IIIB  CA-27-29 and CEA 09/19/2015 were non-informative October 2016.  Unfortunately CT scans of the chest abdomen and pelvis 09/16/2015 showed bony metastases to the ri/ght scapula, left iliac crest, and also L4 and T-spine. There were questionable liver cysts which on repeat CT scan 03/02/2016 appear to be a little bit more well-defined, possibly a little larger. There were also some possible right upper lobe lung lesions.  Adjuvant treatment consisted of docetaxel, trastuzumab and pertuzumab, with the final (6th) docetaxel dose given 02/11/2016. She continues on trastuzumab and pertuzumab, with the 11th cycle given 05/05/2016. Echocardiogram 02/26/2016 showed an ejection fraction of 55%. She receives denosumab/Xgeva every 4 weeks.. She also receives radiation, started 06/09/201, to be completed 06/26/2016.  Her subsequent history is as detailed below   PAST MEDICAL HISTORY: Past Medical History:  Diagnosis Date   Alcohol abuse    none since 2013   Anemia    during chemo   Anxiety    At age 67   Arthritis Dx 2010   Bipolar disorder ([place])    Breast cancer ([place])    Bronchitis    Cancer ([place])    breast mets to brain   CHF (congestive heart failure) ([place])    Chronic pain    resolved per patient 4/53/64   Complication of anesthesia    Depression    Family history of adverse reaction to anesthesia    MOther had PONV   GERD (gastroesophageal reflux disease)    Headache    hx  migraines   HLD (hyperlipidemia)    Hypertension    Lymphedema of left arm    Opiate dependence (HCC)    PONV (postoperative nausea and vomiting)    Port-A-Cath in place    PTSD (post-traumatic stress disorder)    S/P endometrial ablation    in md's office    PAST SURGICAL HISTORY: Past Surgical History:  Procedure Laterality Date   APPLICATION OF CRANIAL NAVIGATION N/A 08/14/2016   Procedure: APPLICATION OF CRANIAL NAVIGATION;  Surgeon:  Erline Levine, MD;  Location: [facility];  Service: Neurosurgery;  Laterality: N/A;   BREAST RECONSTRUCTION Left    with silicone implant   COLONOSCOPY W/ POLYPECTOMY     CRANIOTOMY N/A 08/14/2016   Procedure: CRANIOTOMY TUMOR EXCISION WITH Lucky Rathke;  Surgeon: Erline Levine, MD;  Location: [facility];  Service: Neurosurgery;  Laterality: N/A;   FIBULA FRACTURE SURGERY Left    MASTECTOMY Left    RADIOLOGY WITH ANESTHESIA N/A 07/23/2016   Procedure: MRI OF BRAIN WITH AND WITHOUT;  Surgeon: Medication Radiologist, MD;  Location: [facility];  Service: Radiology;  Laterality: N/A;   RADIOLOGY WITH ANESTHESIA N/A 09/08/2016   Procedure: MRI OF BRAIN WITH AND WITHOUT CONTRAST;  Surgeon: Medication Radiologist, MD;  Location: [facility];  Service: Radiology;  Laterality: N/A;   RADIOLOGY WITH ANESTHESIA N/A 12/10/2016   Procedure: MRI OF BRAIN WITH AND WITHOUT;  Surgeon: Medication Radiologist, MD;  Location: [facility];  Service: Radiology;  Laterality: N/A;   RADIOLOGY WITH ANESTHESIA N/A 03/02/2017   Procedure: MRI of BRAIN W and W/OUT CONTRAST;  Surgeon: Medication Radiologist, MD;  Location: [facility];  Service: Radiology;  Laterality: N/A;   RADIOLOGY WITH ANESTHESIA N/A 07/29/2017   Procedure: RADIOLOGY WITH ANESTHESIA MRI OF BRAIN WITH AND WITHOUT CONTRAST;  Surgeon: Radiologist, Medication, MD;  Location: [facility];  Service: Radiology;  Laterality: N/A;   RADIOLOGY WITH ANESTHESIA N/A 12/07/2017   Procedure: MRI WITH ANESTHESIA OF BRAIN WITH AND WITHOUT CONTRAST;  Surgeon: Radiologist, Medication, MD;  Location: [facility];  Service: Radiology;  Laterality: N/A;   RADIOLOGY WITH ANESTHESIA N/A 04/07/2018   Procedure: MRI OF BRAIN WITH AND WITHOUT CONTRAST;  Surgeon: Radiologist, Medication, MD;  Location: [facility];  Service: Radiology;  Laterality: N/A;   RADIOLOGY WITH ANESTHESIA N/A 08/23/2018   Procedure: MRI WITH ANESTHESIA OF THE BRAIN WITH AND WITHOUT;  Surgeon: Radiologist, Medication, MD;  Location: [facility];  Service:  Radiology;  Laterality: N/A;   RADIOLOGY WITH ANESTHESIA N/A 01/24/2019   Procedure: MRI OF BRAIN WITH AND WITHOUT CONTRAST;  Surgeon: Radiologist, Medication, MD;  Location: [facility];  Service: Radiology;  Laterality: N/A;   RADIOLOGY WITH ANESTHESIA N/A 07/06/2019   Procedure: MRI WITH ANESTHESIA OF BRAIN WITH AND WITHOUT CONTRAST;  Surgeon: Radiologist, Medication, MD;  Location: [facility];  Service: Radiology;  Laterality: N/A;   RADIOLOGY WITH ANESTHESIA N/A 01/11/2020   Procedure: MRI WITH ANESTHESIA BRAIN WITH AND WITHOUT;  Surgeon: Radiologist, Medication, MD;  Location: [facility];  Service: Radiology;  Laterality: N/A;   RADIOLOGY WITH ANESTHESIA N/A 05/21/2020   Procedure: MRI WITH ANESTHESIA OF BRAIN WITH AND WITHOUT CONTRAST;  Surgeon: Radiologist, Medication, MD;  Location: [facility];  Service: Radiology;  Laterality: N/A;   RADIOLOGY WITH ANESTHESIA N/A 01/02/2021   Procedure: MRI WITH ANESTHESIA  BRAIN WITH AND WITHOUT CONTRAST,TOTAL SPINE MET SCREENING;  Surgeon: Radiologist, Medication, MD;  Location: [facility];  Service: Radiology;  Laterality: N/A;   RADIOLOGY WITH ANESTHESIA N/A 10/07/2021   Procedure: MRI BRAIN WITH AND WITHOUT CONTRAST; MRI TOTAL SPINE;  METS SCREENING WITH ANESTHESIA;  Surgeon: Radiologist, Medication, MD;  Location: [facility];  Service: Radiology;  Laterality: N/A;   right power port placement Right     FAMILY HISTORY Family History  Problem Relation Age of Onset   Diabetes Mother    Bipolar disorder Mother    CAD Father    Breast cancer Neg Hx   The patient's father still living, age 60, in [place]. He had prostate cancer at some point in the past. The patient's mother died at age 41 from complications of diabetes. The patient had no brothers, 2 sisters. A paternal grandmother had lung cancer in the setting of tobacco abuse. There is no other history of cancer in the family to her knowledge   GYNECOLOGIC HISTORY:  No LMP recorded. Patient has had an ablation. Menarche  approximately age 30. First live birth in 22. The patient is GX P2. She underwent endometrial ablation in 2016.   SOCIAL HISTORY: (Updated April 2022) The patient is originally from [place]. She moved to this area from [place] because it was less expensive here she says.   She now has has her own 1 bedroom apartment on [address].  She is divorced. Her 2 children are [name] who lives in [place] and works as a Development worker, community, and Lynbrook who lives in [place] and works as Freight forwarder of a healthcare facility.  [name] has 2 children, Interior and spatial designer and Grayce Sessions.  He married in 2022, his wife Minette Brine, has 2 children of her own plus a step grandson).  [name] has no children.    ADVANCED DIRECTIVES: Not in place; at the 06/03/2016 visit the patient was given the appropriate forms to complete and notarize at her discretion   HEALTH MAINTENANCE: Social History   Tobacco Use   Smoking status: Former  Packs/day: 1.00    Years: 43.00    Total pack years: 43.00    Types: Cigarettes   Smokeless tobacco: Never   Tobacco comments:    3 day since last cigarette  Vaping Use   Vaping Use: Some days  Substance Use Topics   Alcohol use: No    Comment: no ETOH since 08/22/12   Drug use: No    Comment: states she's in recovery program for 7 years     Colonoscopy:  PAP:  Bone density:   Allergies  Allergen Reactions   Demerol [Meperidine Hcl] Itching and Nausea And Vomiting    INTOLERANCE >  N & V   Erythromycin Rash    Current Outpatient Medications on File Prior to Visit  Medication Sig Dispense Refill   acetaminophen (TYLENOL) 500 MG tablet Take 1 tablet (500 mg total) by mouth 2 (two) times daily as needed. Take with naproxen 250 mg tablet (Patient taking differently: Take 500 mg by mouth 2 (two) times daily. Take with naproxen 250 mg tablet) 180 tablet 4   anastrozole (ARIMIDEX) 1 MG tablet Take 1 tablet (1 mg total) by mouth daily. 90 tablet 4   azelastine (ASTELIN) 0.1 % nasal spray  Place 2 sprays into both nostrils daily. Use in each nostril as directed (Patient taking differently: Place 1 spray into both nostrils daily. Use in each nostril as directed) 30 mL 12   carvedilol (COREG) 3.125 MG tablet TAKE 1 TABLET (3.125 MG TOTAL) BY MOUTH 2 TIMES DAILY FOR 10 DAYS, THEN 2 TABLETS 2 TIMES DAILY. 120 tablet 3   cholecalciferol (VITAMIN D3) 25 MCG (1000 UNIT) tablet Take 2 tablets (2,000 Units total) by mouth daily. (Patient taking differently: Take 5,000 Units by mouth daily.) 90 tablet 4   cromolyn (OPTICROM) 4 % ophthalmic solution Place 1 drop into both eyes daily.     fluticasone (FLONASE) 50 MCG/ACT nasal spray SPRAY 2 SPRAYS INTO EACH NOSTRIL EVERY DAY 16 mL 0   gabapentin (NEURONTIN) 300 MG capsule Take 300-600 mg by mouth See admin instructions. Take 300 mg in the morning and 600 mg at bedtime     lamoTRIgine (LAMICTAL) 100 MG tablet Take 100 mg by mouth every morning.     lidocaine (XYLOCAINE) 2 % solution 26m swish and swallow every 4 hours as needed for sore throat 100 mL 0   loratadine (CLARITIN) 10 MG tablet Take 1 tablet (10 mg total) by mouth daily. 90 tablet 1   lubiprostone (AMITIZA) 24 MCG capsule Take 1 capsule (24 mcg total) by mouth daily with breakfast. 90 capsule 1   Lurasidone HCl 60 MG TABS Take 60 mg by mouth daily.     naproxen (NAPROSYN) 250 MG tablet Take 1 tablet (250 mg total) by mouth 2 (two) times daily as needed. Take together with tylenol 500 mg tablet (Patient taking differently: Take 250 mg by mouth 2 (two) times daily. Take together with tylenol 500 mg tablet) 180 tablet 4   ondansetron (ZOFRAN) 8 MG tablet TAKE 1 TABLET BY MOUTH EVERY 8 HOURS AS NEEDED FOR NAUSEA AND VOMITING 90 tablet 1   pantoprazole (PROTONIX) 40 MG tablet TAKE 1 TABLET BY MOUTH EVERY DAY 90 tablet 3   polyethylene glycol (MIRALAX / GLYCOLAX) 17 g packet Take 17 g by mouth daily as needed for mild constipation. (Patient taking differently: Take 17 g by mouth every 3 (three)  days.) 30 each 6   rosuvastatin (CRESTOR) 10 MG tablet TAKE 1 TABLET BY MOUTH  EVERY DAY 90 tablet 3   traZODone (DESYREL) 100 MG tablet Take 1 tablet (100 mg total) by mouth at bedtime. (Patient taking differently: Take 200-300 mg by mouth at bedtime.) 30 tablet 0   vitamin C (ASCORBIC ACID) 500 MG tablet Take 500 mg by mouth daily.     No current facility-administered medications on file prior to visit.    OBJECTIVE: White woman in no acute distress  There were no vitals filed for this visit. There is no height or weight on file to calculate BMI.  ECOG FS: 2   GENERAL: Patient appears chronically ill, is in a wheelchair and is in no acute distress. Physical Exam Constitutional:      Appearance: Normal appearance.  Cardiovascular:     Rate and Rhythm: Normal rate and regular rhythm.     Pulses: Normal pulses.     Heart sounds: Normal heart sounds.  Pulmonary:     Effort: Pulmonary effort is normal. No respiratory distress.     Breath sounds: Normal breath sounds.  Abdominal:     General: Abdomen is flat. Bowel sounds are normal.  Musculoskeletal:     Cervical back: Normal range of motion and neck supple. No rigidity.     Comments: She is in a wheelchair today  Lymphadenopathy:     Cervical: No cervical adenopathy.  Skin:    General: Skin is warm and dry.  Neurological:     General: No focal deficit present.     Mental Status: She is alert.  Psychiatric:        Mood and Affect: Mood normal.      LAB RESULTS: No results found for: "LABCA2"  CBC    Component Value Date/Time   WBC 4.7 04/20/2022 1256   WBC 3.8 (L) 03/27/2021 0941   RBC 3.62 (L) 04/20/2022 1256   HGB 11.0 (L) 04/20/2022 1256   HGB 12.1 10/26/2017 1038   HCT 32.8 (L) 04/20/2022 1256   HCT 35.7 10/26/2017 1038   PLT 138 (L) 04/20/2022 1256   PLT 167 10/26/2017 1038   MCV 90.6 04/20/2022 1256   MCV 98.4 10/26/2017 1038   MCH 30.4 04/20/2022 1256   MCHC 33.5 04/20/2022 1256   RDW 12.0 04/20/2022  1256   RDW 13.3 10/26/2017 1038   LYMPHSABS 1.0 04/20/2022 1256   LYMPHSABS 0.5 (L) 10/26/2017 1038   MONOABS 0.4 04/20/2022 1256   MONOABS 0.1 10/26/2017 1038   EOSABS 0.0 04/20/2022 1256   EOSABS 0.0 10/26/2017 1038   BASOSABS 0.0 04/20/2022 1256   BASOSABS 0.0 10/26/2017 1038      Latest Ref Rng & Units 04/20/2022   12:56 PM 03/30/2022   11:55 AM 03/05/2022    1:03 PM  CMP  Glucose 70 - 99 mg/dL 100  95  95   BUN 8 - 23 mg/dL 10  15  12    Creatinine 0.44 - 1.00 mg/dL 1.01  1.06  0.98   Sodium 135 - 145 mmol/L 141  139  141   Potassium 3.5 - 5.1 mmol/L 3.7  4.2  3.8   Chloride 98 - 111 mmol/L 103  102  103   CO2 22 - 32 mmol/L 32  34  34   Calcium 8.9 - 10.3 mg/dL 9.9  10.3  9.7   Total Protein 6.5 - 8.1 g/dL 6.8  7.0  6.4   Total Bilirubin 0.3 - 1.2 mg/dL 0.3  0.3  0.2   Alkaline Phos 38 - 126 U/L 98  99  93   AST 15 - 41 U/L 26  25  41   ALT 0 - 44 U/L 15  16  37     STUDIES: No results found.   ELIGIBLE FOR AVAILABLE RESEARCH PROTOCOL: no  ASSESSMENT: 61 y.o. [place] woman with stage IV left-sided breast cancer involving bone and central nervous system  (1) s/p left breast lower inner quadrant biopsy 06/19/2015 for a clinical T2-3 NX invasive ductal carcinoma, grade 2, triple positive.  (2) status post left mastectomy and axillary lymph node dissection  with immediate expander placement 07/18/2015 for an mpT4 pN2,stage IIIB invasive ductal carcinoma, grade 3, with negative margins.  (a) definitive implant exchange to be scheduled in December   METASTATIC DISEASE: October 2016  (3) CT scan of the chest abdomen and pelvis  09/16/2015 shows metastatic lesions in the right scapula, left iliac crest, L4, and T spine. There were questionable liver cysts, with repeat CT scan 03/02/2016 showing possible right upper lobe lung lesions and possibly increased liver lesions  (a) CT scan of the chest 06/17/2016 shows no active disease in the lungs or liver  (b) Bone scan July  2017 showed no evidence of bony metastatic disease   (c) head CT 07/08/2016 showed a cerebellar lesion, confirmed by MRI 07/23/2016, status post craniotomy 08/14/2016, confirming a metastatic deposit which was estrogen and progesterone receptor negative, HER-2 amplified with a signals ratio of 7.16, number per cell 13.25  (d) CA 27-29 is not informative  (4) received docetaxel every 3 weeks 6 together with trastuzumab and pertuzumab, last docetaxel dose 02/11/2016  (5) adjuvant radiation7/03/2016 to 06/26/2016 at [facility]: 1. The Left chest wall was treated to 23.4 Gy in 13 fractions at 1.8 Gy per fraction. 2. The Left chest wall was boosted to 10 Gy in 5 fractions at 2 Gy per fraction. 3. The Left Sclav/PAB was treated to 23.4 Gy in 13 fractions at 1.8 Gy per fraction.   [Note: Including the patient's treatment in [place] (received 15 fractions per Dr. Maryan Rued near [place], [place]), the patient received 50.4 Gy to the left chest wall and supraclavicular region. ]  (6) started trastuzumab and pertuzumab October 2016, continuing every 3 weeks,  (a) echocardiogram 02/26/2016 showed a well preserved ejection fraction  (b) echocardiogram 07/01/2016 shows an ejection fraction in the 60-65%   (c) pertuzumab discontinued 10/2016 with uncontrolled diarrhea  (d) echocardiogram 11/11/2016 showed an ejection fraction in the 60-65%  (e) echocardiogram 03/03/2017 shows an ejection fraction of 60-65%  (f) echocardiogram on 05/19/2017 shows an ejection fraction of 55-60%  (g) echocardiogram 09/24/2017 shows the ejection fraction in the 60-65%  (h) echocardiogram 02/14/2018 shows an ejection fraction in the 60-65%  (I) echocardiogram  06/30/2018 shows an ejection fraction in the 55-60%  (m) echocardiogram on 12/08/2018 shows an ejection fraction in the 60-65% range  (7) started denosumab/Xgeva October 2017 given every 4 weeks, transitioned to every 8 weeks beginning 10/11/18 (every 6 weeks while giving TDM1  every 3 weeks)  (a) Xgeva held after 08/21/2020 dose due to dental concerns  (8) started anastrozole October 2017   (a) bone scan 11/10/2016 shows no active disease  (b) chest CT scan 11/10/2016 stable, with no evidence of active disease  (c) chest CT and bone scan 07/02/2017 show no evidence of active disease  (d) CT scan of the chest with contrast 11/10/2017 shows some left axillary edema, but no evidence of thrombosis or adenopathy in that area, 0.9 cm precarinal lymph and 0.7 cm right  upper lobe nodule node; bone lesions stable  (e) CT of the chest 05/04/2018 shows a 1.4 cm right lower paratracheal node which is slightly increased and a new right prevascular mediastinal node measuring 0.7 cm.  Bone lesions are stable.  (f) chest CT on 07/01/2018 shows no definite findings of metastatic disease in the thorax. Previously noted borderline enlarged low right paratracheal lymph node is stable to slightly decreased in size   (e) chest CT on 12/30/2018 notes mild increase in right paratracheal adneopathy, recommended PET scan.  Pet scan on 01/19/2019 showed a hypermetabolic and enlarged right paratracheal lymph node consistent with breast cancer recurrence.  (f)Trastuzumab discontinued due to February 2020 scans   (g) TDM-1 started on 01/31/2019 given every 21 days.  (h) Chest CT 05/15/2019 shows decrease in mediastinal adenopathy  (i) CT chest on 08/17/2019 shows resolution of thoracic adenopathy  (j) TDM 1 discontinued after 10/11/2019 dose (8 months treatment) because of thrombocytopenia  (k) trastuzumab resumed 11/01/2019, repeated every 21 days  (l) echocardiogram 12/06/2019 showed an ejection fraction in the 60-65% range  (m) echo 08/12/2021 shows an ejection fraction in the 55-60% range.   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  (a) brain irradiation10/19/17-11/08/17: Whole brain/ 35 Gy in 14 fractions   (b) repeat brain MRI obtained 12/10/2016 shows no active disease in the brain  (c) repeat brain MRI 03/02/2017 shows no evidence of residual or recurrent disease  (d) repeat brain MRI 07/29/2017 shows no evidence of residual or recurrent disease  (e) repeat brain MRI 12/07/2017 shows no evidence of disease recurrence.  There is progressive white matter change secondary to prior treatment.  (f) repeat brain MRI 04/07/2018 showed no evidence of disease\  (g) repeat brain MRI on 08/23/2018 shows no evidence of disease  (h) repeat brain MRI on 01/25/2019 shows no evidence of disease  (I) brain MRI 07/06/2019 shows no evidence of active disease  (j) brain MRI 01/11/2020 shows a subdural hematoma measuring 0.8 cm but no evidence of recurrent metastatic disease  (k) brain MRI 05/21/2020 no evidence of recurrent disease; chronic changes c/w prior treatment and recent subdural (otherwise resolved)  (l) brain MRI 01/02/2021 shows no evidence of recurrent disease.  There is some dural thickening in the area of prior subdural hematoma and continued follow-up is recommended.  (13) peripheral disease monitoring:  (A) chest CT scan, brain CT and bone scan 01/31/2020 show no evidence of active disease  (B) CT chest with contrast 06/10/2020 showed no evidence of actice/progressive disease (C) MRI total spine 01/03/2021 shows no enhancing bony mets, prior sclerotic lesions not well visualized  (D) CT of the chest 01/20/2021 stable, with unchanged bony metastasis, scattered subcentimeter low-attenuation liver lesions too small to characterize, and no measurable disease.  (E) PET scan 08/20/2021 shows no progression or active disease MRI brain in November 2022  showed stable postop changes from prior left cerebellar metastasis resection, no evidence of recurrent tumor at the site.  No new intracranial metastasis.  Persistent although decreased smooth dural thickening and enhancement overlying the right parietal lobe likely related to prior subdural hematoma at the site.    MR total spine  med screening showed known bony metastasis within the T9 vertebral body with minimal peripheral enhancement unchanged from the prior total spine MRI of 01/02/2021 no MR evidence of new osseous metastasis.  Additional sclerotic lesion seen on prior CT exams are not appreciable by MR modality.  No pathologic fracture or epidural tumor involvement.  Cervical thoracic and lumbar spondylosis as outlined and unchanged.  No more than mild appreciable spinal canal narrowing. PET scan February 27, 2022 with no PET/CT findings to suggest active metastatic disease.  Stable Taken sclerotic changes in the T9 vertebral body but no hypermetabolic bone lesions to suggest active metastatic disease.  PLAN:  Ms. Hampton has no clinical or radiographic sign of progression of her metastatic breast cancer.   She will continue on anastrozole and Herceptin.  She should continue imaging every 6 months to assess disease response or evidence of progression.  She will have to establish with medical oncology in [place] Arimidex as prescribed.  [facility] See held for dental issues. Last echo in April with EF of 60 to 65%.  Grade 1 diastolic dysfunction.  No significant change compared to the prior echo in September 2022.  Total encounter time 20 minutes.* in face to face visit time, chart review, lab review, order entry, care coordination, and documentation of the encounter.  *Total Encounter Time as defined by the Centers for Medicare and Medicaid Services includes, in addition to the face-to-face time of a patient visit (documented in the note above) non-face-to-face time: obtaining and reviewing  outside history, ordering and reviewing medications, tests or procedures, care coordination (communications with other health care professionals or caregivers) and documentation in the medical record.

## 2022-06-04 ENCOUNTER — Telehealth: Payer: Self-pay | Admitting: *Deleted

## 2022-06-04 NOTE — Telephone Encounter (Signed)
VM received from [facility] in [place] stating need for orders for therapy for lymphedema.  Return call given as [phone].  Noted per chart review pt has now been seen on 05/19/2022 by oncologist Dr Chrystine Oiler at [facility].  This RN returned call to above # and obtained identified VM for [facility]. Detailed message left stating need for orders to be obtained from her new oncologist - Dr Chrystine Oiler with phone number of [phone] now that she has been established with him per visit on 05/19/2022.  This RN's name and direct desk number given for contact if needed.

## 2022-06-22 ENCOUNTER — Other Ambulatory Visit: Payer: Self-pay

## 2022-06-30 ENCOUNTER — Other Ambulatory Visit: Payer: Self-pay

## 2022-07-02 ENCOUNTER — Encounter: Payer: Self-pay | Admitting: Hematology and Oncology

## 2022-07-02 ENCOUNTER — Encounter: Payer: Self-pay | Admitting: Oncology

## 2022-10-01 ENCOUNTER — Telehealth: Payer: Self-pay | Admitting: *Deleted

## 2022-10-01 NOTE — Telephone Encounter (Signed)
error 

## 2022-10-13 ENCOUNTER — Ambulatory Visit: Payer: Medicaid Other | Admitting: Internal Medicine

## 2022-10-17 ENCOUNTER — Other Ambulatory Visit: Payer: Self-pay

## 2023-06-04 ENCOUNTER — Encounter: Payer: Self-pay | Admitting: Gastroenterology

## 2023-10-06 ENCOUNTER — Encounter: Payer: Self-pay | Admitting: Hematology and Oncology
